# Patient Record
Sex: Female | Born: 1950 | Race: White | Hispanic: No | Marital: Married | Smoking: Former smoker
Health system: Southern US, Community
[De-identification: ages and names within clinical notes are randomized; demographics above are authoritative.]

## PROBLEM LIST (undated history)

## (undated) DIAGNOSIS — J189 Pneumonia, unspecified organism: Secondary | ICD-10-CM

## (undated) DIAGNOSIS — M545 Low back pain, unspecified: Secondary | ICD-10-CM

## (undated) DIAGNOSIS — J4489 Other specified chronic obstructive pulmonary disease: Secondary | ICD-10-CM

## (undated) DIAGNOSIS — R011 Cardiac murmur, unspecified: Secondary | ICD-10-CM

## (undated) DIAGNOSIS — IMO0001 Reserved for inherently not codable concepts without codable children: Secondary | ICD-10-CM

## (undated) DIAGNOSIS — F410 Panic disorder [episodic paroxysmal anxiety] without agoraphobia: Secondary | ICD-10-CM

## (undated) DIAGNOSIS — I10 Essential (primary) hypertension: Secondary | ICD-10-CM

## (undated) DIAGNOSIS — M4800 Spinal stenosis, site unspecified: Secondary | ICD-10-CM

## (undated) DIAGNOSIS — K219 Gastro-esophageal reflux disease without esophagitis: Secondary | ICD-10-CM

## (undated) DIAGNOSIS — I509 Heart failure, unspecified: Secondary | ICD-10-CM

## (undated) DIAGNOSIS — Z9289 Personal history of other medical treatment: Secondary | ICD-10-CM

## (undated) DIAGNOSIS — I4891 Unspecified atrial fibrillation: Secondary | ICD-10-CM

## (undated) DIAGNOSIS — M1990 Unspecified osteoarthritis, unspecified site: Secondary | ICD-10-CM

## (undated) DIAGNOSIS — G8929 Other chronic pain: Secondary | ICD-10-CM

## (undated) DIAGNOSIS — E049 Nontoxic goiter, unspecified: Secondary | ICD-10-CM

## (undated) DIAGNOSIS — I2510 Atherosclerotic heart disease of native coronary artery without angina pectoris: Secondary | ICD-10-CM

## (undated) DIAGNOSIS — G510 Bell's palsy: Secondary | ICD-10-CM

## (undated) DIAGNOSIS — K760 Fatty (change of) liver, not elsewhere classified: Secondary | ICD-10-CM

## (undated) DIAGNOSIS — J449 Chronic obstructive pulmonary disease, unspecified: Secondary | ICD-10-CM

## (undated) DIAGNOSIS — H409 Unspecified glaucoma: Secondary | ICD-10-CM

## (undated) DIAGNOSIS — D649 Anemia, unspecified: Secondary | ICD-10-CM

## (undated) DIAGNOSIS — E785 Hyperlipidemia, unspecified: Secondary | ICD-10-CM

## (undated) DIAGNOSIS — E282 Polycystic ovarian syndrome: Secondary | ICD-10-CM

## (undated) DIAGNOSIS — H269 Unspecified cataract: Secondary | ICD-10-CM

## (undated) HISTORY — DX: Chronic obstructive pulmonary disease, unspecified: J44.9

## (undated) HISTORY — DX: Gastro-esophageal reflux disease without esophagitis: K21.9

## (undated) HISTORY — PX: COLONOSCOPY: SHX174

## (undated) HISTORY — DX: Reserved for inherently not codable concepts without codable children: IMO0001

## (undated) HISTORY — DX: Hyperlipidemia, unspecified: E78.5

## (undated) HISTORY — PX: FACIAL NERVE DECOMPRESSION: SHX185

## (undated) HISTORY — DX: Polycystic ovarian syndrome: E28.2

## (undated) HISTORY — DX: Nontoxic goiter, unspecified: E04.9

## (undated) HISTORY — DX: Unspecified osteoarthritis, unspecified site: M19.90

## (undated) HISTORY — DX: Other chronic pain: G89.29

## (undated) HISTORY — DX: Low back pain, unspecified: M54.50

## (undated) HISTORY — DX: Other specified chronic obstructive pulmonary disease: J44.89

## (undated) HISTORY — DX: Atherosclerotic heart disease of native coronary artery without angina pectoris: I25.10

## (undated) HISTORY — DX: Spinal stenosis, site unspecified: M48.00

## (undated) HISTORY — DX: Essential (primary) hypertension: I10

## (undated) HISTORY — DX: Anemia, unspecified: D64.9

## (undated) HISTORY — DX: Low back pain: M54.5

## (undated) HISTORY — DX: Unspecified glaucoma: H40.9

## (undated) HISTORY — DX: Cardiac murmur, unspecified: R01.1

## (undated) HISTORY — DX: Pneumonia, unspecified organism: J18.9

## (undated) HISTORY — DX: Personal history of other medical treatment: Z92.89

## (undated) HISTORY — DX: Bell's palsy: G51.0

---

## 1980-08-13 HISTORY — PX: ABDOMINAL HYSTERECTOMY: SHX81

## 1998-03-31 ENCOUNTER — Ambulatory Visit (HOSPITAL_COMMUNITY): Admission: RE | Admit: 1998-03-31 | Discharge: 1998-03-31 | Payer: Self-pay | Admitting: Internal Medicine

## 1998-07-15 ENCOUNTER — Other Ambulatory Visit: Admission: RE | Admit: 1998-07-15 | Discharge: 1998-07-15 | Payer: Self-pay | Admitting: Obstetrics and Gynecology

## 1999-09-29 ENCOUNTER — Other Ambulatory Visit: Admission: RE | Admit: 1999-09-29 | Discharge: 1999-09-29 | Payer: Self-pay | Admitting: Obstetrics and Gynecology

## 2000-07-10 ENCOUNTER — Ambulatory Visit (HOSPITAL_COMMUNITY): Admission: RE | Admit: 2000-07-10 | Discharge: 2000-07-10 | Payer: Self-pay | Admitting: Internal Medicine

## 2000-07-10 ENCOUNTER — Encounter: Payer: Self-pay | Admitting: Internal Medicine

## 2000-09-24 ENCOUNTER — Encounter: Payer: Self-pay | Admitting: Obstetrics and Gynecology

## 2000-09-24 ENCOUNTER — Encounter: Admission: RE | Admit: 2000-09-24 | Discharge: 2000-09-24 | Payer: Self-pay | Admitting: Obstetrics and Gynecology

## 2001-01-09 ENCOUNTER — Other Ambulatory Visit: Admission: RE | Admit: 2001-01-09 | Discharge: 2001-01-09 | Payer: Self-pay | Admitting: Obstetrics and Gynecology

## 2001-07-02 ENCOUNTER — Encounter: Admission: RE | Admit: 2001-07-02 | Discharge: 2001-09-30 | Payer: Self-pay | Admitting: Internal Medicine

## 2001-10-01 ENCOUNTER — Ambulatory Visit (HOSPITAL_COMMUNITY): Admission: RE | Admit: 2001-10-01 | Discharge: 2001-10-01 | Payer: Self-pay | Admitting: Internal Medicine

## 2001-10-01 ENCOUNTER — Encounter: Payer: Self-pay | Admitting: Internal Medicine

## 2002-02-04 ENCOUNTER — Other Ambulatory Visit: Admission: RE | Admit: 2002-02-04 | Discharge: 2002-02-04 | Payer: Self-pay | Admitting: Obstetrics and Gynecology

## 2002-05-05 ENCOUNTER — Emergency Department (HOSPITAL_COMMUNITY): Admission: EM | Admit: 2002-05-05 | Discharge: 2002-05-06 | Payer: Self-pay | Admitting: Emergency Medicine

## 2002-08-24 ENCOUNTER — Encounter (HOSPITAL_COMMUNITY): Admission: RE | Admit: 2002-08-24 | Discharge: 2002-11-22 | Payer: Self-pay | Admitting: Cardiology

## 2002-11-23 ENCOUNTER — Encounter (HOSPITAL_COMMUNITY): Admission: RE | Admit: 2002-11-23 | Discharge: 2003-02-21 | Payer: Self-pay | Admitting: Cardiology

## 2002-12-25 ENCOUNTER — Encounter: Payer: Self-pay | Admitting: Obstetrics and Gynecology

## 2002-12-25 ENCOUNTER — Encounter: Admission: RE | Admit: 2002-12-25 | Discharge: 2002-12-25 | Payer: Self-pay | Admitting: Obstetrics and Gynecology

## 2003-02-22 ENCOUNTER — Encounter (HOSPITAL_COMMUNITY): Admission: RE | Admit: 2003-02-22 | Discharge: 2003-03-24 | Payer: Self-pay | Admitting: Cardiology

## 2003-04-27 ENCOUNTER — Encounter (HOSPITAL_COMMUNITY): Admission: RE | Admit: 2003-04-27 | Discharge: 2003-07-26 | Payer: Self-pay | Admitting: Cardiology

## 2004-01-21 ENCOUNTER — Encounter: Admission: RE | Admit: 2004-01-21 | Discharge: 2004-01-21 | Payer: Self-pay | Admitting: Obstetrics and Gynecology

## 2004-06-30 ENCOUNTER — Ambulatory Visit: Payer: Self-pay | Admitting: Internal Medicine

## 2004-10-13 ENCOUNTER — Ambulatory Visit: Payer: Self-pay | Admitting: Internal Medicine

## 2004-12-09 ENCOUNTER — Ambulatory Visit (HOSPITAL_COMMUNITY): Admission: RE | Admit: 2004-12-09 | Discharge: 2004-12-09 | Payer: Self-pay | Admitting: Emergency Medicine

## 2005-05-11 ENCOUNTER — Ambulatory Visit: Payer: Self-pay | Admitting: Internal Medicine

## 2005-09-21 ENCOUNTER — Ambulatory Visit: Payer: Self-pay | Admitting: Internal Medicine

## 2005-09-21 ENCOUNTER — Encounter: Admission: RE | Admit: 2005-09-21 | Discharge: 2005-09-21 | Payer: Self-pay | Admitting: Obstetrics and Gynecology

## 2005-10-03 ENCOUNTER — Ambulatory Visit: Payer: Self-pay | Admitting: Internal Medicine

## 2005-11-15 ENCOUNTER — Encounter: Admission: RE | Admit: 2005-11-15 | Discharge: 2005-11-15 | Payer: Self-pay | Admitting: Internal Medicine

## 2006-02-15 ENCOUNTER — Encounter: Admission: RE | Admit: 2006-02-15 | Discharge: 2006-05-16 | Payer: Self-pay | Admitting: Internal Medicine

## 2006-04-02 ENCOUNTER — Ambulatory Visit: Payer: Self-pay | Admitting: Internal Medicine

## 2006-08-13 DIAGNOSIS — J189 Pneumonia, unspecified organism: Secondary | ICD-10-CM

## 2006-08-13 HISTORY — DX: Pneumonia, unspecified organism: J18.9

## 2006-10-10 ENCOUNTER — Ambulatory Visit: Payer: Self-pay | Admitting: Internal Medicine

## 2006-11-01 ENCOUNTER — Encounter: Admission: RE | Admit: 2006-11-01 | Discharge: 2006-11-01 | Payer: Self-pay | Admitting: Internal Medicine

## 2007-01-01 ENCOUNTER — Ambulatory Visit: Payer: Self-pay | Admitting: Internal Medicine

## 2007-01-01 LAB — CONVERTED CEMR LAB
ALT: 18 units/L (ref 0–40)
AST: 18 units/L (ref 0–37)
Cholesterol: 153 mg/dL (ref 0–200)
Creatinine,U: 38.3 mg/dL
Direct LDL: 74.7 mg/dL
HDL: 44.7 mg/dL (ref >39.0)
Hgb A1c MFr Bld: 5.8 % (ref 4.6–6.0)
Microalb Creat Ratio: 96.6 mg/g — ABNORMAL HIGH (ref 0.0–30.0)
Microalb, Ur: 3.7 mg/dL — ABNORMAL HIGH (ref 0.0–1.9)
Total CHOL/HDL Ratio: 3.4
Triglycerides: 204 mg/dL (ref 0–149)
VLDL: 41 mg/dL — ABNORMAL HIGH (ref 0–40)

## 2007-01-02 DIAGNOSIS — E785 Hyperlipidemia, unspecified: Secondary | ICD-10-CM | POA: Insufficient documentation

## 2007-01-02 DIAGNOSIS — R7402 Elevation of levels of lactic acid dehydrogenase (LDH): Secondary | ICD-10-CM | POA: Insufficient documentation

## 2007-01-02 DIAGNOSIS — Z8701 Personal history of pneumonia (recurrent): Secondary | ICD-10-CM | POA: Insufficient documentation

## 2007-01-02 DIAGNOSIS — R7401 Elevation of levels of liver transaminase levels: Secondary | ICD-10-CM | POA: Insufficient documentation

## 2007-01-02 DIAGNOSIS — Z8719 Personal history of other diseases of the digestive system: Secondary | ICD-10-CM | POA: Insufficient documentation

## 2007-01-02 DIAGNOSIS — E1159 Type 2 diabetes mellitus with other circulatory complications: Secondary | ICD-10-CM | POA: Insufficient documentation

## 2007-01-02 DIAGNOSIS — E1169 Type 2 diabetes mellitus with other specified complication: Secondary | ICD-10-CM | POA: Insufficient documentation

## 2007-01-02 DIAGNOSIS — E049 Nontoxic goiter, unspecified: Secondary | ICD-10-CM | POA: Insufficient documentation

## 2007-01-02 DIAGNOSIS — R740 Nonspecific elevation of levels of transaminase and lactic acid dehydrogenase [LDH]: Secondary | ICD-10-CM

## 2007-01-02 DIAGNOSIS — Z9079 Acquired absence of other genital organ(s): Secondary | ICD-10-CM | POA: Insufficient documentation

## 2007-04-11 ENCOUNTER — Encounter: Payer: Self-pay | Admitting: Internal Medicine

## 2007-04-18 ENCOUNTER — Encounter: Payer: Self-pay | Admitting: Internal Medicine

## 2007-06-27 ENCOUNTER — Ambulatory Visit: Payer: Self-pay | Admitting: Internal Medicine

## 2007-08-12 ENCOUNTER — Telehealth (INDEPENDENT_AMBULATORY_CARE_PROVIDER_SITE_OTHER): Payer: Self-pay | Admitting: *Deleted

## 2007-08-14 HISTORY — PX: UPPER GI ENDOSCOPY: SHX6162

## 2007-08-15 ENCOUNTER — Ambulatory Visit: Payer: Self-pay | Admitting: Internal Medicine

## 2007-08-15 DIAGNOSIS — D509 Iron deficiency anemia, unspecified: Secondary | ICD-10-CM

## 2007-08-15 DIAGNOSIS — D649 Anemia, unspecified: Secondary | ICD-10-CM | POA: Insufficient documentation

## 2007-09-04 ENCOUNTER — Encounter: Payer: Self-pay | Admitting: Internal Medicine

## 2007-09-26 ENCOUNTER — Encounter: Payer: Self-pay | Admitting: Internal Medicine

## 2007-10-27 ENCOUNTER — Telehealth (INDEPENDENT_AMBULATORY_CARE_PROVIDER_SITE_OTHER): Payer: Self-pay | Admitting: *Deleted

## 2007-10-29 ENCOUNTER — Telehealth (INDEPENDENT_AMBULATORY_CARE_PROVIDER_SITE_OTHER): Payer: Self-pay | Admitting: *Deleted

## 2007-11-05 ENCOUNTER — Telehealth (INDEPENDENT_AMBULATORY_CARE_PROVIDER_SITE_OTHER): Payer: Self-pay | Admitting: *Deleted

## 2007-11-14 ENCOUNTER — Encounter: Admission: RE | Admit: 2007-11-14 | Discharge: 2007-11-14 | Payer: Self-pay | Admitting: Obstetrics and Gynecology

## 2008-01-20 ENCOUNTER — Telehealth (INDEPENDENT_AMBULATORY_CARE_PROVIDER_SITE_OTHER): Payer: Self-pay | Admitting: *Deleted

## 2008-01-23 ENCOUNTER — Ambulatory Visit: Payer: Self-pay | Admitting: Internal Medicine

## 2008-01-26 LAB — CONVERTED CEMR LAB
ALT: 21 units/L (ref 0–35)
AST: 19 units/L (ref 0–37)
Cholesterol: 217 mg/dL (ref 0–200)
Creatinine,U: 28.3 mg/dL
Direct LDL: 143.4 mg/dL
HDL: 41.5 mg/dL (ref >39.0)
Hgb A1c MFr Bld: 5.7 % (ref 4.6–6.0)
Microalb Creat Ratio: 127.2 mg/g — ABNORMAL HIGH (ref 0.0–30.0)
Microalb, Ur: 3.6 mg/dL — ABNORMAL HIGH (ref 0.0–1.9)
Total CHOL/HDL Ratio: 5.2
Triglycerides: 198 mg/dL — ABNORMAL HIGH (ref 0–149)
VLDL: 40 mg/dL (ref 0–40)

## 2008-02-09 ENCOUNTER — Encounter: Payer: Self-pay | Admitting: Internal Medicine

## 2008-02-12 ENCOUNTER — Ambulatory Visit: Payer: Self-pay | Admitting: Internal Medicine

## 2008-02-12 DIAGNOSIS — M545 Low back pain, unspecified: Secondary | ICD-10-CM | POA: Insufficient documentation

## 2008-02-12 LAB — CONVERTED CEMR LAB
Cholesterol, target level: 200 mg/dL
HDL goal, serum: 40 mg/dL
LDL Goal: 100 mg/dL

## 2008-02-26 ENCOUNTER — Encounter: Payer: Self-pay | Admitting: Internal Medicine

## 2008-02-26 HISTORY — PX: TRANSTHORACIC ECHOCARDIOGRAM: SHX275

## 2008-03-04 ENCOUNTER — Telehealth (INDEPENDENT_AMBULATORY_CARE_PROVIDER_SITE_OTHER): Payer: Self-pay | Admitting: *Deleted

## 2008-03-08 ENCOUNTER — Telehealth (INDEPENDENT_AMBULATORY_CARE_PROVIDER_SITE_OTHER): Payer: Self-pay | Admitting: *Deleted

## 2008-03-29 ENCOUNTER — Telehealth (INDEPENDENT_AMBULATORY_CARE_PROVIDER_SITE_OTHER): Payer: Self-pay | Admitting: *Deleted

## 2008-04-02 ENCOUNTER — Encounter: Payer: Self-pay | Admitting: Internal Medicine

## 2008-04-16 ENCOUNTER — Telehealth (INDEPENDENT_AMBULATORY_CARE_PROVIDER_SITE_OTHER): Payer: Self-pay | Admitting: *Deleted

## 2008-04-26 ENCOUNTER — Telehealth (INDEPENDENT_AMBULATORY_CARE_PROVIDER_SITE_OTHER): Payer: Self-pay | Admitting: *Deleted

## 2008-05-06 ENCOUNTER — Encounter: Payer: Self-pay | Admitting: Internal Medicine

## 2008-05-21 ENCOUNTER — Encounter: Admission: RE | Admit: 2008-05-21 | Discharge: 2008-05-21 | Payer: Self-pay | Admitting: Cardiology

## 2008-05-26 ENCOUNTER — Ambulatory Visit (HOSPITAL_COMMUNITY): Admission: RE | Admit: 2008-05-26 | Discharge: 2008-05-27 | Payer: Self-pay | Admitting: Cardiology

## 2008-05-26 HISTORY — PX: CORONARY ANGIOPLASTY: SHX604

## 2008-06-17 ENCOUNTER — Encounter: Payer: Self-pay | Admitting: Internal Medicine

## 2008-06-22 ENCOUNTER — Telehealth (INDEPENDENT_AMBULATORY_CARE_PROVIDER_SITE_OTHER): Payer: Self-pay | Admitting: *Deleted

## 2008-07-22 ENCOUNTER — Ambulatory Visit (HOSPITAL_COMMUNITY): Admission: RE | Admit: 2008-07-22 | Discharge: 2008-07-22 | Payer: Self-pay | Admitting: Gastroenterology

## 2008-07-22 ENCOUNTER — Encounter: Payer: Self-pay | Admitting: Internal Medicine

## 2008-07-22 ENCOUNTER — Encounter (INDEPENDENT_AMBULATORY_CARE_PROVIDER_SITE_OTHER): Payer: Self-pay | Admitting: Gastroenterology

## 2008-08-31 ENCOUNTER — Telehealth (INDEPENDENT_AMBULATORY_CARE_PROVIDER_SITE_OTHER): Payer: Self-pay | Admitting: *Deleted

## 2008-11-19 ENCOUNTER — Encounter: Admission: RE | Admit: 2008-11-19 | Discharge: 2008-11-19 | Payer: Self-pay | Admitting: Obstetrics and Gynecology

## 2008-11-22 ENCOUNTER — Telehealth (INDEPENDENT_AMBULATORY_CARE_PROVIDER_SITE_OTHER): Payer: Self-pay | Admitting: *Deleted

## 2008-12-10 ENCOUNTER — Encounter: Payer: Self-pay | Admitting: Internal Medicine

## 2008-12-22 ENCOUNTER — Telehealth (INDEPENDENT_AMBULATORY_CARE_PROVIDER_SITE_OTHER): Payer: Self-pay | Admitting: *Deleted

## 2009-01-12 ENCOUNTER — Encounter (HOSPITAL_COMMUNITY): Admission: RE | Admit: 2009-01-12 | Discharge: 2009-04-12 | Payer: Self-pay | Admitting: Cardiology

## 2009-01-17 ENCOUNTER — Ambulatory Visit: Payer: Self-pay | Admitting: Internal Medicine

## 2009-01-17 DIAGNOSIS — J45909 Unspecified asthma, uncomplicated: Secondary | ICD-10-CM | POA: Insufficient documentation

## 2009-01-18 ENCOUNTER — Telehealth (INDEPENDENT_AMBULATORY_CARE_PROVIDER_SITE_OTHER): Payer: Self-pay | Admitting: *Deleted

## 2009-02-23 ENCOUNTER — Telehealth (INDEPENDENT_AMBULATORY_CARE_PROVIDER_SITE_OTHER): Payer: Self-pay | Admitting: *Deleted

## 2009-05-06 ENCOUNTER — Ambulatory Visit: Payer: Self-pay | Admitting: Internal Medicine

## 2009-05-06 DIAGNOSIS — I10 Essential (primary) hypertension: Secondary | ICD-10-CM | POA: Insufficient documentation

## 2009-05-10 ENCOUNTER — Telehealth (INDEPENDENT_AMBULATORY_CARE_PROVIDER_SITE_OTHER): Payer: Self-pay | Admitting: *Deleted

## 2009-05-10 LAB — CONVERTED CEMR LAB
ALT: 20 units/L (ref 0–35)
AST: 20 units/L (ref 0–37)
Albumin: 3.8 g/dL (ref 3.5–5.2)
Alkaline Phosphatase: 63 units/L (ref 39–117)
BUN: 10 mg/dL (ref 6–23)
Basophils Absolute: 0.1 10*3/uL (ref 0.0–0.1)
Basophils Relative: 0.8 % (ref 0.0–3.0)
Bilirubin, Direct: 0 mg/dL (ref 0.0–0.3)
CO2: 30 meq/L (ref 19–32)
Calcium: 8.8 mg/dL (ref 8.4–10.5)
Chloride: 104 meq/L (ref 96–112)
Cholesterol: 121 mg/dL (ref 0–200)
Creatinine, Ser: 0.7 mg/dL (ref 0.4–1.2)
Eosinophils Absolute: 0.2 10*3/uL (ref 0.0–0.7)
Eosinophils Relative: 2.8 % (ref 0.0–5.0)
Free T4: 0.8 ng/dL (ref 0.6–1.6)
GFR calc non Af Amer: 90.56 mL/min (ref >60)
Glucose, Bld: 121 mg/dL — ABNORMAL HIGH (ref 70–99)
HCT: 35.2 % — ABNORMAL LOW (ref 36.0–46.0)
HDL: 41.5 mg/dL (ref >39.00)
Hemoglobin: 11.7 g/dL — ABNORMAL LOW (ref 12.0–15.0)
Hgb A1c MFr Bld: 5.7 % (ref 4.6–6.5)
LDL Cholesterol: 62 mg/dL (ref 0–99)
Lymphocytes Relative: 19.9 % (ref 12.0–46.0)
Lymphs Abs: 1.5 10*3/uL (ref 0.7–4.0)
MCHC: 33.2 g/dL (ref 30.0–36.0)
MCV: 90 fL (ref 78.0–100.0)
Monocytes Absolute: 0.5 10*3/uL (ref 0.1–1.0)
Monocytes Relative: 7.2 % (ref 3.0–12.0)
Neutro Abs: 5.2 10*3/uL (ref 1.4–7.7)
Neutrophils Relative %: 69.3 % (ref 43.0–77.0)
Platelets: 283 10*3/uL (ref 150.0–400.0)
Potassium: 4.2 meq/L (ref 3.5–5.1)
RBC: 3.91 M/uL (ref 3.87–5.11)
RDW: 13 % (ref 11.5–14.6)
Sodium: 139 meq/L (ref 135–145)
T3, Free: 2.2 pg/mL — ABNORMAL LOW (ref 2.3–4.2)
TSH: 1.44 microintl units/mL (ref 0.35–5.50)
Total Bilirubin: 0.5 mg/dL (ref 0.3–1.2)
Total CHOL/HDL Ratio: 3
Total Protein: 7.2 g/dL (ref 6.0–8.3)
Triglycerides: 88 mg/dL (ref 0.0–149.0)
VLDL: 17.6 mg/dL (ref 0.0–40.0)
WBC: 7.5 10*3/uL (ref 4.5–10.5)

## 2009-05-18 ENCOUNTER — Ambulatory Visit: Payer: Self-pay | Admitting: Internal Medicine

## 2009-05-20 LAB — CONVERTED CEMR LAB
Basophils Absolute: 0 10*3/uL (ref 0.0–0.1)
Basophils Relative: 0.1 % (ref 0.0–3.0)
Eosinophils Absolute: 0.2 10*3/uL (ref 0.0–0.7)
Eosinophils Relative: 2.4 % (ref 0.0–5.0)
Folate: 15.5 ng/mL
HCT: 34.5 % — ABNORMAL LOW (ref 36.0–46.0)
Hemoglobin: 11.5 g/dL — ABNORMAL LOW (ref 12.0–15.0)
Iron: 61 ug/dL (ref 42–145)
Lymphocytes Relative: 15.9 % (ref 12.0–46.0)
Lymphs Abs: 1.3 10*3/uL (ref 0.7–4.0)
MCHC: 33.2 g/dL (ref 30.0–36.0)
MCV: 91 fL (ref 78.0–100.0)
Monocytes Absolute: 0.4 10*3/uL (ref 0.1–1.0)
Monocytes Relative: 4.3 % (ref 3.0–12.0)
Neutro Abs: 6.3 10*3/uL (ref 1.4–7.7)
Neutrophils Relative %: 77.3 % — ABNORMAL HIGH (ref 43.0–77.0)
Platelets: 247 10*3/uL (ref 150.0–400.0)
RBC: 3.8 M/uL — ABNORMAL LOW (ref 3.87–5.11)
RDW: 12.9 % (ref 11.5–14.6)
Saturation Ratios: 12.5 % — ABNORMAL LOW (ref 20.0–50.0)
Transferrin: 348.6 mg/dL (ref 212.0–360.0)
Vitamin B-12: 359 pg/mL (ref 211–911)
WBC: 8.2 10*3/uL (ref 4.5–10.5)

## 2009-05-25 ENCOUNTER — Ambulatory Visit: Payer: Self-pay | Admitting: Internal Medicine

## 2009-05-25 LAB — CONVERTED CEMR LAB
OCCULT 1: NEGATIVE
OCCULT 2: NEGATIVE
OCCULT 3: NEGATIVE

## 2009-06-24 ENCOUNTER — Encounter: Payer: Self-pay | Admitting: Internal Medicine

## 2009-07-29 ENCOUNTER — Encounter: Payer: Self-pay | Admitting: Internal Medicine

## 2009-08-17 ENCOUNTER — Telehealth (INDEPENDENT_AMBULATORY_CARE_PROVIDER_SITE_OTHER): Payer: Self-pay | Admitting: *Deleted

## 2009-08-17 ENCOUNTER — Encounter: Payer: Self-pay | Admitting: Internal Medicine

## 2009-08-19 ENCOUNTER — Ambulatory Visit: Payer: Self-pay | Admitting: Internal Medicine

## 2009-08-19 ENCOUNTER — Telehealth: Payer: Self-pay | Admitting: Internal Medicine

## 2009-08-19 DIAGNOSIS — J45901 Unspecified asthma with (acute) exacerbation: Secondary | ICD-10-CM | POA: Insufficient documentation

## 2009-08-30 ENCOUNTER — Telehealth (INDEPENDENT_AMBULATORY_CARE_PROVIDER_SITE_OTHER): Payer: Self-pay | Admitting: *Deleted

## 2010-01-02 ENCOUNTER — Telehealth (INDEPENDENT_AMBULATORY_CARE_PROVIDER_SITE_OTHER): Payer: Self-pay | Admitting: *Deleted

## 2010-01-23 ENCOUNTER — Telehealth (INDEPENDENT_AMBULATORY_CARE_PROVIDER_SITE_OTHER): Payer: Self-pay | Admitting: *Deleted

## 2010-02-24 ENCOUNTER — Encounter: Admission: RE | Admit: 2010-02-24 | Discharge: 2010-02-24 | Payer: Self-pay | Admitting: Obstetrics and Gynecology

## 2010-03-20 ENCOUNTER — Telehealth (INDEPENDENT_AMBULATORY_CARE_PROVIDER_SITE_OTHER): Payer: Self-pay | Admitting: *Deleted

## 2010-04-07 ENCOUNTER — Telehealth (INDEPENDENT_AMBULATORY_CARE_PROVIDER_SITE_OTHER): Payer: Self-pay | Admitting: *Deleted

## 2010-05-01 ENCOUNTER — Telehealth (INDEPENDENT_AMBULATORY_CARE_PROVIDER_SITE_OTHER): Payer: Self-pay | Admitting: *Deleted

## 2010-05-12 ENCOUNTER — Ambulatory Visit: Payer: Self-pay | Admitting: Internal Medicine

## 2010-05-12 DIAGNOSIS — R1319 Other dysphagia: Secondary | ICD-10-CM | POA: Insufficient documentation

## 2010-05-12 DIAGNOSIS — M79609 Pain in unspecified limb: Secondary | ICD-10-CM | POA: Insufficient documentation

## 2010-05-15 ENCOUNTER — Encounter: Payer: Self-pay | Admitting: Internal Medicine

## 2010-05-18 ENCOUNTER — Ambulatory Visit: Payer: Self-pay | Admitting: Internal Medicine

## 2010-05-18 ENCOUNTER — Encounter (INDEPENDENT_AMBULATORY_CARE_PROVIDER_SITE_OTHER): Payer: Self-pay | Admitting: *Deleted

## 2010-05-18 ENCOUNTER — Telehealth (INDEPENDENT_AMBULATORY_CARE_PROVIDER_SITE_OTHER): Payer: Self-pay | Admitting: *Deleted

## 2010-05-18 LAB — CONVERTED CEMR LAB
Bilirubin Urine: NEGATIVE
Blood in Urine, dipstick: NEGATIVE
Glucose, Urine, Semiquant: NEGATIVE
Ketones, urine, test strip: NEGATIVE
Nitrite: NEGATIVE
Protein, U semiquant: NEGATIVE
Specific Gravity, Urine: <1.005
Urobilinogen, UA: NEGATIVE
WBC Urine, dipstick: NEGATIVE
pH: 5

## 2010-05-19 LAB — CONVERTED CEMR LAB
ALT: 20 units/L (ref 0–35)
AST: 20 units/L (ref 0–37)
Albumin: 3.7 g/dL (ref 3.5–5.2)
Alkaline Phosphatase: 58 units/L (ref 39–117)
BUN: 11 mg/dL (ref 6–23)
Basophils Absolute: 0 10*3/uL (ref 0.0–0.1)
Basophils Relative: 0.4 % (ref 0.0–3.0)
Bilirubin, Direct: 0.1 mg/dL (ref 0.0–0.3)
CO2: 27 meq/L (ref 19–32)
Calcium: 8.8 mg/dL (ref 8.4–10.5)
Chloride: 98 meq/L (ref 96–112)
Cholesterol: 123 mg/dL (ref 0–200)
Creatinine, Ser: 0.7 mg/dL (ref 0.4–1.2)
Eosinophils Absolute: 0.2 10*3/uL (ref 0.0–0.7)
Eosinophils Relative: 3.2 % (ref 0.0–5.0)
GFR calc non Af Amer: 91.76 mL/min (ref >60)
Glucose, Bld: 105 mg/dL — ABNORMAL HIGH (ref 70–99)
HCT: 34.7 % — ABNORMAL LOW (ref 36.0–46.0)
HDL: 49.3 mg/dL (ref >39.00)
Hemoglobin: 11.7 g/dL — ABNORMAL LOW (ref 12.0–15.0)
Hgb A1c MFr Bld: 6.2 % (ref 4.6–6.5)
LDL Cholesterol: 58 mg/dL (ref 0–99)
Lymphocytes Relative: 16.5 % (ref 12.0–46.0)
Lymphs Abs: 1.3 10*3/uL (ref 0.7–4.0)
MCHC: 33.7 g/dL (ref 30.0–36.0)
MCV: 90.2 fL (ref 78.0–100.0)
Monocytes Absolute: 0.5 10*3/uL (ref 0.1–1.0)
Monocytes Relative: 6.2 % (ref 3.0–12.0)
Neutro Abs: 5.6 10*3/uL (ref 1.4–7.7)
Neutrophils Relative %: 73.7 % (ref 43.0–77.0)
Platelets: 253 10*3/uL (ref 150.0–400.0)
Potassium: 4.2 meq/L (ref 3.5–5.1)
RBC: 3.85 M/uL — ABNORMAL LOW (ref 3.87–5.11)
RDW: 14 % (ref 11.5–14.6)
Sodium: 136 meq/L (ref 135–145)
TSH: 2.01 microintl units/mL (ref 0.35–5.50)
Total Bilirubin: 0.3 mg/dL (ref 0.3–1.2)
Total CHOL/HDL Ratio: 2
Total Protein: 6.5 g/dL (ref 6.0–8.3)
Triglycerides: 79 mg/dL (ref 0.0–149.0)
VLDL: 15.8 mg/dL (ref 0.0–40.0)
WBC: 7.6 10*3/uL (ref 4.5–10.5)

## 2010-07-03 ENCOUNTER — Telehealth: Payer: Self-pay | Admitting: Internal Medicine

## 2010-07-05 ENCOUNTER — Ambulatory Visit: Payer: Self-pay | Admitting: Internal Medicine

## 2010-08-08 ENCOUNTER — Telehealth: Payer: Self-pay | Admitting: Internal Medicine

## 2010-08-25 ENCOUNTER — Encounter: Payer: Self-pay | Admitting: Internal Medicine

## 2010-09-02 ENCOUNTER — Encounter: Payer: Self-pay | Admitting: Internal Medicine

## 2010-09-03 ENCOUNTER — Encounter: Payer: Self-pay | Admitting: Obstetrics and Gynecology

## 2010-09-03 ENCOUNTER — Encounter: Payer: Self-pay | Admitting: Orthopedic Surgery

## 2010-09-04 ENCOUNTER — Encounter: Payer: Self-pay | Admitting: Orthopedic Surgery

## 2010-09-04 ENCOUNTER — Telehealth (INDEPENDENT_AMBULATORY_CARE_PROVIDER_SITE_OTHER): Payer: Self-pay | Admitting: *Deleted

## 2010-09-05 ENCOUNTER — Ambulatory Visit
Admission: RE | Admit: 2010-09-05 | Discharge: 2010-09-05 | Payer: Self-pay | Source: Home / Self Care | Attending: Internal Medicine | Admitting: Internal Medicine

## 2010-09-05 DIAGNOSIS — R519 Headache, unspecified: Secondary | ICD-10-CM | POA: Insufficient documentation

## 2010-09-05 DIAGNOSIS — R51 Headache: Secondary | ICD-10-CM | POA: Insufficient documentation

## 2010-09-05 DIAGNOSIS — R42 Dizziness and giddiness: Secondary | ICD-10-CM | POA: Insufficient documentation

## 2010-09-14 NOTE — Assessment & Plan Note (Signed)
Summary: ROA//TO F/U ON LABS// CYD   Vital Signs:  Patient Profile:   60 Years Old Female Weight:      213.4 pounds Pulse rate:   76 / minute Resp:     15 per minute BP sitting:   124 / 82  (left arm) Cuff size:   large  Vitals Entered By: Shonna Chock (February 12, 2008 4:06 PM)                 Chief Complaint:  FOLLOW-UP ON LABS and Type 2 diabetes mellitus follow-up.  History of Present Illness: Labs reviewed ; A1c was 5.7%. No hypoglycemia as she adjusts Actos & Metformin if meal intake decreased. Dr Elsie Lincoln changed her from Lipitor to Zetia; LDL now 143.  Type 2 Diabetes Mellitus Follow-Up      This is a 60 year old woman who presents for Type 2 diabetes mellitus follow-up.  Minor postural symptoms.  The patient reports numbness of extremities, but denies polyuria, polydipsia, blurred vision, self managed hypoglycemia, hypoglycemia requiring help, weight loss, and weight gain.  Other symptoms include orthostatic symptoms.  The patient denies the following symptoms: neuropathic pain, chest pain, vomiting, poor wound healing, intermittent claudication, vision loss, and foot ulcer.  Since the last visit the patient reports good dietary compliance, compliance with medications, not exercising regularly, and not monitoring blood glucose.  Since the last visit, the patient reports having had eye care by an ophthalmologist and no foot care.  Complications from diabetes include ASCVD.    Lipid Management History:      Positive NCEP/ATP III risk factors include female age 62 years old or older or older, diabetes, family history for ischemic heart disease (males less than 46 years old), hypertension, and ASHD (atherosclerotic heart disease).  Negative NCEP/ATP III risk factors include no history of early menopause without estrogen hormone replacement, non-tobacco-user status, no prior stroke/TIA, no peripheral vascular disease, and no history of aortic aneurysm.       Current Allergies: ! *  PLAVIX  Past Medical History:    Diabetes mellitus, type II    Hyperlipidemia    PSO; S/P TAH & BSO  Past Surgical History:    Hysterectomy & BSO    CATH-2002,2006   Family History:    Father: DECEASED    Mother: DECEASED    Siblings: 2 BROTHERS, 1 SISTER    HEART-FATHER DECEASED (AGE 60) HEART ATTACK             Family History Hypertension-FATHER        Family History of Alcoholism/Addiction-AUNTS/UNCLES ON BOTH SIDE'S OF FAMILY    Family History Depression-MOTHER        Family History of Ulcers-MOTHER    LUNG COMPLICATIONS-MOTHER     Family Hsitory Headaches-MOTHER  Social History:    No diet   Risk Factors:  Tobacco use:  quit    Year quit:  AGE42 Alcohol use:  no Exercise:  no  Family History Risk Factors:    Family History of MI in females < 69 years old:  no    Family History of MI in males < 49 years old:  yes   Review of Systems  General      Denies fatigue, sleep disorder, and weight loss.  Eyes      Denies blurring and double vision.      Bilat cataracts  ENT      Denies difficulty swallowing and hoarseness.  CV      Denies chest pain  or discomfort, difficulty breathing at night, difficulty breathing while lying down, palpitations, swelling of feet, and swelling of hands.      DOE but not exercising  Resp      Denies excessive snoring, hypersomnolence, and morning headaches.  GI      Complains of diarrhea and indigestion.      Denies abdominal pain, bloody stools, constipation, and dark tarry stools.      Loose stool with dairy; colonoscopy 03/17/08 ,Dr Matthias Hughs. No dysphagia  GU      Denies incontinence.  MS      Complains of joint pain and low back pain.      Celebrex once daily  & 3 AS Tylenol  daily  Derm      Denies changes in color of skin and poor wound healing.  Neuro      Complains of numbness and tingling.      intermittent tingling in hands  Endo      Denies cold intolerance, excessive hunger, excessive thirst,  excessive urination, heat intolerance, polyuria, and weight change.   Physical Exam  General:     Well-developed,well-nourished,in no acute distress; alert,appropriate and cooperative throughout examination Neck:     No deformities, masses, or tenderness noted.Thyroid slightly full on R Lungs:     Normal respiratory effort, chest expands symmetrically. Lungs are clear to auscultation, no crackles or wheezes. Heart:     normal rate, regular rhythm, no gallop, no rub, no JVD, and grade 1 /6 systolic murmur.   Abdomen:     Bowel sounds positive,abdomen soft and non-tender without masses, organomegaly or hernias noted. Pulses:     R and L carotid,radial,dorsalis pedis and posterior tibial pulses are full and equal bilaterally. Bilat carotid bruits vs radiation Extremities:     No clubbing, cyanosis, edema, or deformity noted . Neurologic:     alert & oriented X3 and DTRs symmetrical and normal.  Equivocal Tinel's RUE Skin:     Intact without suspicious lesions or rashes Cervical Nodes:     No lymphadenopathy noted Axillary Nodes:     No palpable lymphadenopathy Psych:     memory intact for recent and remote, normally interactive, good eye contact, and not anxious appearing.      Impression & Recommendations:  Problem # 1:  HYPERLIPIDEMIA (ICD-272.4) as per Dr Elsie Lincoln Her updated medication list for this problem includes:    Zetia 10 Mg Tabs (Ezetimibe) .[name]... 1 by mouth qd   Problem # 2:  DIABETES MELLITUS, TYPE II (ICD-250.00)  Her updated medication list for this problem includes:    Metformin Hcl 1000 Mg Tabs (Metformin hcl) .[name]... Take 1 tablet by mouth twice a day    Accupril 40 Mg Tabs (Quinapril hcl) .[name]... 1 by mouth qd    Actos 45 Mg Tabs (Pioglitazone hcl) .[name]... 1 by mouth qd    Aspirin Adult Low Strength 81 Mg Tbec (Aspirin) .[name]... 1 by mouth once daily   Problem # 3:  GOITER (ICD-240.9)  Problem # 4:  LOW BACK PAIN, CHRONIC (ICD-724.2) Dr Jeral Fruit , MRI  pending Her updated medication list for this problem includes:    Celebrex 200 Mg Caps (Celecoxib) .[name]... 1 by mouth bid    Aspirin Adult Low Strength 81 Mg Tbec (Aspirin) .[name]... 1 by mouth once daily    Darvon-n 100 Mg Tabs (Propoxyphene napsylate) .[name]... 1-2 by mouth once daily as needed    Tylenol Arthritis Pain 650 Mg Tbcr (Acetaminophen) .[name]... 1 by mouth in  the am, 2 by mouth in the pm   Complete Medication List: 1)  Metformin Hcl 1000 Mg Tabs (Metformin hcl) .... Take 1 tablet by mouth twice a day 2)  Accupril 40 Mg Tabs (Quinapril hcl) .[name].. 1 by mouth qd 3)  Advair Diskus 250-50 Mcg/dose Misc (Fluticasone-salmeterol) .... Use one puff twice daily, rinse, gargle and spit after use. 4)  Atenolol 50 Mg Tabs (Atenolol) .[name].. 1 by mouth qd 5)  Paxil Cr 25 Mg Tb24 (Paroxetine hcl) .[name].. 1 by mouth qd 6)  Estradiol 0.5 Mg Tabs (Estradiol) .[name].. 1 by mouth qd 7)  Amaryl 2 Mg Tabs (glimepiride)  .... 1/2 tab qd 8)  Timoptic-xe 0.25 % Solg (Timolol maleate) 9)  Celebrex 200 Mg Caps (Celecoxib) .[name].. 1 by mouth bid 10)  Actos 45 Mg Tabs (Pioglitazone hcl) .[name].. 1 by mouth qd 11)  Azmacort 75 Mcg/act Aers (Triamcinolone acetonide) .... 2 puffs 2-4 times daily 12)  Albuterol 90 Mcg/act Aers (Albuterol) .... Prn 13)  Lorazepam 0.5 Mg Tabs (Lorazepam) .... 1/2 tab once daily prn 14)  Amlodipine Besylate 10 Mg Tabs (Amlodipine besylate) .[name].. 1 by mouth qd 15)  Zetia 10 Mg Tabs (Ezetimibe) .[name].. 1 by mouth qd 16)  Bactroban 2 % Oint (Mupirocin) .... Use as directed daily 17)  Multivitamins Tabs (Multiple vitamin) .[name].. 1 by mouth once daily 18)  Aspirin Adult Low Strength 81 Mg Tbec (Aspirin) .[name].. 1 by mouth once daily 19)  Vitamin C 1000 Mg Tabs (Ascorbic acid) .[name].. 1 by mouth once daily 20)  Darvon-n 100 Mg Tabs (Propoxyphene napsylate) .[name].. 1-2 by mouth once daily as needed 21)  Claritin 10 Mg Tabs (Loratadine) .[name].. 1 by mouth once daily 22)  Tylenol Arthritis Pain 650 Mg Tbcr (Acetaminophen) .[name].. 1 by  mouth in the am, 2 by mouth in the pm 23)  Melatonin 5 Mg Tabs (Melatonin) .[name].. 1 by mouth at bedtime 24)  Fish Oil 1000 Mg Caps (Omega-3 fatty acids) .[name].. 1 by mouth once daily 25)  Mucinex 600 Mg Tb12 (Guaifenesin) .[name].. 1 by mouth as needed 26)  Herb Lax  .Marland KitchenMarland Kitchen. 1 by mouth once daily 27)  Spiru-tein in 1% Milk  .[name].. 7 days/week  Lipid Assessment/Plan:      Based on NCEP/ATP III, the patient's risk factor category is "history of coronary disease, peripheral vascular disease, cerebrovascular disease, or aortic aneurysm along with either diabetes, current smoker, or LDL > 130 plus HDL < 40 plus triglycerides > 200".  From this information, the patient's calculated lipid goals are as follows: Total cholesterol goal is 200; LDL cholesterol goal is 70; HDL cholesterol goal is 40; Triglyceride goal is 150.  Her LDL cholesterol goal has not been met.  Secondary causes for hyperlipidemia have been ruled out.  She has been counseled on adjunctive measures for lowering her cholesterol and has been provided with dietary instructions.     Patient Instructions: 1)  Share these records & labs with each physician you see. LDL goal = < 70, unless Dr Elsie Lincoln declares otherwise   Prescriptions: AMARYL 2 MG  TABS (GLIMEPIRIDE) 1/2 tab qd  #90 x 1   Entered and Authorized by:   Marga Melnick MD   Signed by:   Marga Melnick MD on 02/12/2008   Method used:   Print then Give to Patient   RxID:   [id number]  ]  Appended Document: ROA//TO F/U ON LABS// CYD Prescriptions: FREESTYLE LANCETS   MISC (LANCETS) USE AS DIRECTED TO MONTIOR  BLOOD SUGARS  #1 MO SUPPLY x PRN X 1 YEAR   Entered by:   Shonna Chock   Authorized by:   Marga Melnick MD   Signed by:   Shonna Chock on 02/12/2008   Method used:   Print then Give to Patient   RxID:   [id number] FREESTYLE LITE TEST   STRP (GLUCOSE BLOOD) USE AS DIRECTED  #18MO SUPPLY x PRN   Entered by:   Shonna Chock   Authorized by:   Marga Melnick MD   Signed  by:   Shonna Chock on 02/12/2008   Method used:   Print then Give to Patient   RxID:   [id number]  RX'S WERE FAXED TO [facility]./Chrae Malloy  February 12, 2008 5:24 PM

## 2010-09-14 NOTE — Assessment & Plan Note (Signed)
Summary: diabetic check/med refill /cbs   Vital Signs:  Patient profile:   60 year old female Height:      62 inches Weight:      224.6 pounds BMI:     41.23 Temp:     97.9 degrees F oral Pulse rate:   76 / minute Resp:     16 per minute BP sitting:   138 / 60  (left arm) Cuff size:   large  Vitals Entered By: Shonna Chock CMA (May 12, 2010 3:29 PM) CC: DM and med follow-up, Type 2 diabetes mellitus follow-up, Lower Extremity Joint pain   CC:  DM and med follow-up, Type 2 diabetes mellitus follow-up, and Lower Extremity Joint pain.  History of Present Illness:   The patient reports self managed hypoglycemia  ( glucose as low as 73)and weight gain of 10 #, but denies polyuria, polydipsia, blurred vision, and numbness of extremities.  Other symptoms include chest pain attributed to asthma as per Dr Allyson Sabal.  The patient denies the following symptoms: neuropathic pain, vomiting, orthostatic symptoms, poor wound healing, intermittent claudication, vision loss, and foot ulcer.  Since the last visit the patient reports fair dietary compliance, compliance with medications, and exercising regularly as walking ( limited by stenosis).  The patient has been measuring capillary blood glucose before breakfast  (87-137)and after dinner ( 160-180).  Since the last visit, the patient reports having had eye care by an ophthalmologist  but  no foot care.        The patient also presents with Lower Extremity calf  pain last week.  The patient reports swelling  in calf & ankle, but denies decreased ROM and weakness.   The pain began suddenly and with no injury but after prolonged walking.  The pain is described as aching.  The patient denies the following symptoms: fever, rash, photosensitivity, eye symptoms, diarrhea, and dysuria. Rx: none , but swelling  resolved with elevation.Pain  recurred as of 09/29. PMH of lumabar stenosis.No PMH of DVT.   Allergies: 1)  ! * Plavix  Review of Systems GI:  Denies  bloody stools, dark tarry stools, and indigestion; Rare dysphagia; on PPI OTC once daily . Followed by Dr Matthias Hughs.  Physical Exam  General:  in no acute distress; alert,appropriate and cooperative throughout examination Mouth:  Oral mucosa and oropharynx without lesions or exudates.  Teeth in good repair. No pharyngeal erythema.   Neck:  No deformities, masses, or tenderness noted. Lungs:  Normal respiratory effort, chest expands symmetrically. Lungs are clear to auscultation, no crackles or wheezes. Heart:  normal rate, regular rhythm, no gallop, no rub, no JVD, and grade 1 /6 systolic murmur.   Pulses:  R and L carotid,radial,dorsalis pedis and posterior tibial pulses are full and equal bilaterally. Prominent veins LLE  Extremities:  No clubbing, cyanosis, edema. Negative Homan's. Great toe nails absent Neurologic:  alert & oriented X3, strength normal in all extremities, sensation intact to light touch, and DTRs symmetrical and normal.   Skin:  Intact without suspicious lesions or rashes Psych:  memory intact for recent and remote, normally interactive, and good eye contact.     Impression & Recommendations:  Problem # 1:  LEG PAIN, LEFT (ICD-729.5) Clinically no DVT; probably due to referred pain from Spinal Stenosis  Problem # 2:  DIABETES MELLITUS, TYPE II (ICD-250.00)  Her updated medication list for this problem includes:    Metformin Hcl 1000 Mg Tabs (Metformin hcl) .[name]... Take 1 tablet by mouth  twice a day    Accupril 40 Mg Tabs (Quinapril hcl) .[name]... 1 by mouth qd    Amaryl 2 Mg Tabs (Glimepiride) .[name]... Take 1/2 tablet daily    Actos 45 Mg Tabs (Pioglitazone hcl) .[name]... 1/2 once daily    Aspirin 325 Mg Tabs (Aspirin) .[name]... 1 by mouth once daily  Problem # 3:  OTHER DYSPHAGIA (ICD-787.29) RARE   Problem # 4:  HYPERTENSION (ICD-401.9)  Her updated medication list for this problem includes:    Accupril 40 Mg Tabs (Quinapril hcl) .[name]... 1 by mouth qd    Atenolol 50 Mg Tabs  (Atenolol) .[name]... 1 by mouth qd    Amlodipine Besylate 10 Mg Tabs (Amlodipine besylate) .[name]... 1 by mouth qd  Problem # 5:  HYPERLIPIDEMIA (ICD-272.4)  Her updated medication list for this problem includes:    Crestor 10 Mg Tabs (Rosuvastatin calcium) .[name]... 1 by mouth at bedtime  Complete Medication List: 1)  Metformin Hcl 1000 Mg Tabs (Metformin hcl) .... Take 1 tablet by mouth twice a day 2)  Accupril 40 Mg Tabs (Quinapril hcl) .[name].. 1 by mouth qd 3)  Atenolol 50 Mg Tabs (Atenolol) .[name].. 1 by mouth qd 4)  Paxil Cr 25 Mg Tb24 (Paroxetine hcl) .[name].. 1 by mouth qd 5)  Estradiol 0.5 Mg Tabs (Estradiol) .[name].. 1 by mouth qd 6)  Amaryl 2 Mg Tabs (Glimepiride) .... Take 1/2 tablet daily 7)  Lumigan 0.03 % Soln (Bimatoprost) .[name].. 1 x daily 8)  Celebrex 200 Mg Caps (Celecoxib) .[name].. 1 by mouth bid 9)  Actos 45 Mg Tabs (Pioglitazone hcl) .... 1/2 once daily 10)  Lorazepam 0.5 Mg Tabs (Lorazepam) .[name].. 1 tab once daily prn 11)  Amlodipine Besylate 10 Mg Tabs (Amlodipine besylate) .[name].. 1 by mouth qd 12)  Crestor 10 Mg Tabs (Rosuvastatin calcium) .[name].. 1 by mouth at bedtime 13)  Bactroban 2 % Oint (Mupirocin) .... Use as directed daily 14)  Multivitamins Tabs (Multiple vitamin) .[name].. 1 by mouth once daily 15)  Aspirin 325 Mg Tabs (Aspirin) .[name].. 1 by mouth once daily 16)  Vitamin C 1000 Mg Tabs (Ascorbic acid) .[name].. 1 by mouth once daily 17)  Darvon-n 100 Mg Tabs (Propoxyphene napsylate) .[name].. 1-2 by mouth once daily as needed 18)  Tylenol Arthritis Pain 650 Mg Tbcr (Acetaminophen) .[name].. 1 by mouth in the am, 2 by mouth in the pm 19)  Melatonin 5 Mg Tabs (Melatonin) .[name].. 1 by mouth at bedtime 20)  Mega Red  .[name].. 1 by mouth once daily 21)  Mucinex 600 Mg Tb12 (Guaifenesin) .... 2 by mouth as needed 22)  Herb Lax  .Marland KitchenMarland Kitchen. 1 by mouth once daily 23)  Spiru-tein in 1% Milk  .[name].. 7 days/week 24)  Freestyle Freedom Lite W/device Kit (Blood glucose monitoring suppl) .... Use as directed 25)  Freestyle Lite Test Strp  (Glucose blood) .... Use as directed 26)  Freestyle Lancets Misc (Lancets) .... Use as directed to montior blood sugars 27)  Effient 10 Mg Tabs (Prasugrel hcl) .[name].. 1 by mouth once daily 28)  Symbicort 160-4.5 Mcg/act Aero (Budesonide-formoterol fumarate) .[name].. 1-2 puffs q 12 hrs ; gargle after use 29)  Tramadol Hcl 50 Mg Tabs (Tramadol hcl) .[name].. 1 every 6 hrs for pain ; do not take within 4 hours of paxil 30)  Proventil Hfa 108 (90 Base) Mcg/act Aers (Albuterol sulfate) .[name].. 1-2 puffs every 4-6 hours as needed 31)  Promethazine-codeine 6.25-10 Mg/43ml Syrp (Promethazine-codeine) .[name].. 1 teaspoon every 6 hours as needed  Other Orders: Admin 1st Vaccine (32202) Flu Vaccine 33yrs + (54270) Flu Vaccine Consent Questions     Do you have a history of severe allergic reactions to this vaccine? no    Any prior history of allergic reactions to egg and/or gelatin? no    Do you have a sensitivity to the preservative Thimersol? no    Do you have a past history of Guillan-Barre Syndrome? no    Do you currently have an acute febrile illness? no    Have you ever had a severe reaction to latex? no    Vaccine information given and explained to patient? yes    Are you currently pregnant? no    Lot Number:[id number]   Exp Date:02/10/2011   Site Given  Right Deltoid IM  Other Orders: Admin 1st Vaccine (62376) Flu Vaccine 74yrs + (28315)  Patient Instructions: 1)  Hold the Glymeperide Mon 05/15/2010 for fasting labs @ Dr Hazle Coca  office. See Diagnoses for Codes: 2)  BMP ; 3)  Hepatic Panel ;: 4)  Lipid Panel ; 5)  TSH ; 6)  CBC w/ Diff ;: 7)  HbgA1C ; 8)  Urine Microalbumin . 9)  Check your blood sugars regularly. If your readings are usually above : 150or below 90 you should contact our office. 10)  Check your feet each night for sore areas, calluses or signs of infection. 11)  Check your Blood Pressure regularly. If it is above: 135/85 ON AVERAGE  you should make an appointment. Marland Kitchenlbflu

## 2010-09-14 NOTE — Progress Notes (Signed)
Summary: refill  Phone Note Refill Request Message from:  Pharmacy [facility] fax [phone]  mupirocin 2% ointment 22g  Initial call taken by: Barb Merino,  February 23, 2009 9:43 AM  Follow-up for Phone Call        LAST FILLED #15 gram, 06-22-08, LAST OV 01-17-09....Marland KitchenMarland KitchenFelecia Deloach CMA  February 25, 2009 8:58 AM  Additional Follow-up for Phone Call Additional follow up Details #1::        OK X 1 Additional Follow-up by: Marga Melnick MD,  February 25, 2009 11:37 AM    Prescriptions: BACTROBAN 2 %  OINT (MUPIROCIN) use as directed daily  #15grams x 0   Entered by:   Jeremy Johann CMA   Authorized by:   Marga Melnick MD   Signed by:   Jeremy Johann CMA on 02/25/2009   Method used:   Faxed to ...       [facility]* (retail)       [address]       [place], [place]  [id number]       Ph: [phone]       Fax: [phone]   RxID:   [id number]

## 2010-09-14 NOTE — Progress Notes (Signed)
Summary: verify dose  Phone Note From Pharmacy Message from:  Fax from Pharmacy on March 20, 2010 10:37 AM  Caller: [facility]* Call For: verify dose  Summary of Call: fax from [facility] "patient states taking 1/2 to 1 tab daily - is this correct???    actos 45mg  tab Initial call taken by: Okey Regal [place],  March 20, 2010 10:44 AM  Follow-up for Phone Call        I called patient and left message on VM informing patient to please call to discuss who told her to take 1/2-1 tab of actos, we have on file 1/2tab and thats the way it was filled previously Follow-up by: Shonna Chock CMA,  March 21, 2010 8:21 AM  Additional Follow-up for Phone Call Additional follow up Details #1::        I left message with female to have patient return call when avaliable Additional Follow-up by: Shonna Chock CMA,  March 21, 2010 2:42 PM    Additional Follow-up for Phone Call Additional follow up Details #2::    Left message on machine informing patient 3rd attempt to reach her, if patient with any concerns about meds patient is to call us. We got note from pharmacy questioning instructions for actos Follow-up by: Shonna Chock CMA,  March 23, 2010 9:13 AM

## 2010-09-14 NOTE — Letter (Signed)
Summary: [facility]  [facility]   Imported By: Lanelle Bal 06/25/2008 09:50:38  _____________________________________________________________________  External Attachment:    Type:   Image     Comment:   External Document

## 2010-09-14 NOTE — Letter (Signed)
Summary: [facility]   Imported By: Lanelle Bal 09/08/2010 10:58:20  _____________________________________________________________________  External Attachment:    Type:   Image     Comment:   External Document

## 2010-09-14 NOTE — Progress Notes (Signed)
Summary: hop-refill  Phone Note Refill Request   Refills Requested: Medication #1:  ACTOS 45 MG  TABS 1 by mouth qd [facility]--[phone] [phone]----last filled--03-25-08  Initial call taken by: Freddy Jaksch,  April 26, 2008 2:31 PM      Prescriptions: ACTOS 45 MG  TABS (PIOGLITAZONE HCL) 1 by mouth qd  #30 x 4   Entered by:   Doristine Devoid   Authorized by:   Marga Melnick MD   Signed by:   Doristine Devoid on 04/26/2008   Method used:   Electronically to        [facility]* (retail)       [address]       [place], [place]  [id number]       Ph: [phone]       Fax: [phone]   RxID:   [id number]

## 2010-09-14 NOTE — Letter (Signed)
Summary: [facility]  [facility]   Imported By: Lanelle Bal 08/24/2009 13:47:19  _____________________________________________________________________  External Attachment:    Type:   Image     Comment:   External Document

## 2010-09-14 NOTE — Letter (Signed)
Summary: [facility]  [facility]   Imported By: Vanessa Swaziland 04/29/2007 16:02:03  _____________________________________________________________________  External Attachment:    Type:   Image     Comment:   External Document

## 2010-09-14 NOTE — Consult Note (Signed)
Summary: [facility]  [facility]   Imported By: Lanelle Bal 04/07/2008 08:27:12  _____________________________________________________________________  External Attachment:    Type:   Image     Comment:   External Document

## 2010-09-14 NOTE — Progress Notes (Signed)
Summary: [name] [phone]  Phone Note Refill Request   Refills Requested: Medication #1:  AMARYL 2 MG  TABS (GLIMEPIRIDE) 1/2 tab qd [name] [phone]  Initial call taken by: Roselle Locus,  Dec 22, 2008 7:54 AM    New/Updated Medications: AMARYL 2 MG TABS (GLIMEPIRIDE) take 1/2 tablet daily- OFFICE VISIT AND LABS DUE   Prescriptions: AMARYL 2 MG TABS (GLIMEPIRIDE) take 1/2 tablet daily- OFFICE VISIT AND LABS DUE  #15 x 0   Entered by:   Doristine Devoid   Authorized by:   Marga Melnick MD   Signed by:   Doristine Devoid on 12/22/2008   Method used:   Electronically to        [facility]* (retail)       [address]       [place], [place]  [id number]       Ph: [phone]       Fax: [phone]   RxID:   [id number]

## 2010-09-14 NOTE — Progress Notes (Signed)
Summary: call-a-nurse  Phone Note Outgoing Call   Details for Reason: [facility] Triage Call Report Triage Record Num: [id number] Operator: Estevan Oaks Patient Name: Gianna Calef Call Date & Time: 09/02/2010 4:39:48PM Patient Phone: [phone] PCP: Marga Melnick Patient Gender: Female PCP Fax : [phone] Patient DOB: March 22, 1951 Practice Name: [facility] Reason for Call: Ms Friedhoff is calling about ^'d BS. BS 212 after dinner last night; 204 this am. Last was 194. Meds changed in Nov, now gets Janumet. Feels dizzy and has tightness in her chest but sts she has Panic disorder and not sure if it's the sx's she experiences with that condition. Also reports freq urination which has been increasing over last 1 week. Rn adv'd to have adult drive to UC or ED. She will go to UC. Protocol(s) Used: Diabetes: Control Problems Recommended Outcome per Protocol: See ED Immediately Reason for Outcome: Signs of dehydration Care Advice:  ~ 01/  Follow-up for Phone Call        MD aware patient with appointment tomorrow.  Follow-up by: Shonna Chock CMA,  September 04, 2010 10:25 AM

## 2010-09-14 NOTE — Progress Notes (Signed)
Summary: REFILL - DR [name]  Phone Note Refill Request   Refills Requested: Medication #1:  QUINAPRIL HCL 40MG  TAKE 1 TABLET ONCE DAILY RECEVIED [facility] [place] - [phone] -- [facility] [id number]  Initial call taken by: Okey Regal [place],  November 05, 2007 8:35 AM      Prescriptions: ACCUPRIL 40 MG TABS (QUINAPRIL HCL) 1 by mouth qd  #30 x 3   Entered by:   Shonna Chock   Authorized by:   Marga Melnick MD   Signed by:   Shonna Chock on 11/05/2007   Method used:   Electronically sent to ...       [facility]*       [address]       [place], [place]  [address]       Ph: [phone] or [phone]       Fax: [phone]   RxID:   [id number]

## 2010-09-14 NOTE — Letter (Signed)
Summary: [facility]  [facility]   Imported By: Lanelle Bal 05/30/2010 16:20:46  _____________________________________________________________________  External Attachment:    Type:   Image     Comment:   External Document

## 2010-09-14 NOTE — Progress Notes (Signed)
Summary: Hop--Refill mupirocin 2%  Phone Note Refill Request   Refills Requested: Medication #1:  Mupirocin 2% [facility] --[phone]  [phone]  Initial call taken by: Freddy Jaksch,  March 08, 2008 8:46 AM  Follow-up for Phone Call        hop advise on tthis rx please .Kandice Hams  March 08, 2008 2:44 PM  Follow-up by: Kandice Hams,  March 08, 2008 2:44 PM  Additional Follow-up for Phone Call Additional follow up Details #1::        OK X 1 Additional Follow-up by: Marga Melnick MD,  March 08, 2008 5:31 PM      Prescriptions: BACTROBAN 2 %  OINT (MUPIROCIN) use as directed daily  #15grams x 0   Entered by:   Kandice Hams   Authorized by:   Marga Melnick MD   Signed by:   Kandice Hams on 03/09/2008   Method used:   Electronically sent to ...       [facility]*       [address]       [place], [place]  [address]       Ph: [phone]       Fax: [phone]   RxID:   [id number]

## 2010-09-14 NOTE — Progress Notes (Signed)
Summary: actos request  Phone Note Refill Request Message from:  Fax from Pharmacy on October 27, 2007 3:43 PM  Refills Requested: Medication #1:  ACTOS 45 MG  TABS 1 by mouth qd Initial call taken by: Doristine Devoid,  October 27, 2007 3:43 PM      Prescriptions: ACTOS 45 MG  TABS (PIOGLITAZONE HCL) 1 by mouth qd  #30 x 5   Entered by:   Doristine Devoid   Authorized by:   Marga Melnick MD   Signed by:   Doristine Devoid on 10/27/2007   Method used:   Electronically sent to ...       [facility]*       [address]       [place], [place]  [address]       Ph: [phone] or [phone]       Fax: [phone]   RxID:   [id number]

## 2010-09-14 NOTE — Progress Notes (Signed)
Summary: Refill Request  Phone Note Refill Request Call back at [phone] Message from:  Pharmacy on January 23, 2010 10:42 AM  Refills Requested: Medication #1:  METFORMIN HCL 1000 MG TABS Take 1 tablet by mouth twice a day   Dosage confirmed as above?Dosage Confirmed   Supply Requested: 1 month   Last Refilled: 12/21/2009 [facility]  Next Appointment Scheduled: none Initial call taken by: Harold Barban,  January 23, 2010 10:42 AM    Prescriptions: METFORMIN HCL 1000 MG TABS (METFORMIN HCL) Take 1 tablet by mouth twice a day  #60 Each x 4   Entered by:   Shonna Chock   Authorized by:   Marga Melnick MD   Signed by:   Shonna Chock on 01/23/2010   Method used:   Electronically to        [facility]* (retail)       [address]       [place], [place]  [address]       Ph: [phone]       Fax: [phone]   RxID:   [id number]

## 2010-09-14 NOTE — Assessment & Plan Note (Signed)
Summary: difficulty breathing,heart pounding, face flushing//fd   Vital Signs:  Patient profile:   60 year old female Height:      62.75 inches Weight:      226 pounds O2 Sat:      97 % on Room air Temp:     98.4 degrees F oral Pulse rate:   64 / minute Resp:     17 per minute BP sitting:   130 / 68  (left arm)  Vitals Entered By: Jeremy Johann CMA (August 19, 2009 2:47 PM)  O2 Flow:  Room air CC: difficulty breathing, face flushing Comments REVIEWED MED LIST, PATIENT AGREED DOSE AND INSTRUCTION CORRECT    CC:  difficulty breathing and face flushing.  History of Present Illness: She has been having asthma since 05/25/2009, exacerbation in [place] from cleaning products in  hotel rooms. Using QVAR two times a day as needed ; she thought it was a rescue agent. No albutral on hand. Advair 250/50 q 12 hrs X 2 weeks . Symbicort given @ last visit never used. Zpack X 2 days from [facility] records reviewed. Not checking glucoses.  Allergies: 1)  ! * Plavix  Review of Systems General:  Denies chills, fever, and sweats. ENT:  Denies earache, nasal congestion, and sinus pressure; No frontal headacche, facial pain , or purulence. Resp:  Complains of cough, shortness of breath, sputum productive, and wheezing; Scant light green sputum.  Physical Exam  General:  well-nourished,in no acute distress; alert,appropriate and cooperative throughout examination Ears:  External ear exam shows no significant lesions or deformities.  Otoscopic examination reveals clear canals, tympanic membranes are intact bilaterally without bulging, retraction, inflammation or discharge. Hearing is grossly normal bilaterally. Nose:  External nasal examination shows no deformity or inflammation. Nasal mucosa are pink and moist without lesions or exudates. Mouth:  Oral mucosa and oropharynx without lesions or exudates.  Teeth in good repair. Lungs:  Normal respiratory effort, chest expands symmetrically. Lungs are clear to  auscultation, no crackles or wheezes. Heart:  Normal rate and regular rhythm. S1 and S2 normal without gallop,  click, rub. S4. Grade 1 sys murmur Skin:  Intact without suspicious lesions or rashes Cervical Nodes:  No lymphadenopathy noted Axillary Nodes:  No palpable lymphadenopathy   Impression & Recommendations:  Problem # 1:  ASTHMA NOS W/ACUTE EXACERBATION (ICD-493.92)  The following medications were removed from the medication list:    Azmacort 75 Mcg/act Aers (Triamcinolone acetonide) .[name]... 2 puffs 2-4 times daily    Albuterol 90 Mcg/act Aers (Albuterol) .[name]... Prn    Qvar 80 Mcg/act Aers (Beclomethasone dipropionate) .[name]... 1-2 puffs two times a day Her updated medication list for this problem includes:    Symbicort 160-4.5 Mcg/act Aero (Budesonide-formoterol fumarate) .[name]... 1-2 puffs q 12 hrs ; gargle after use    Singulair 10 Mg Tabs (Montelukast sodium) .[name]... 1 once daily  Complete Medication List: 1)  Metformin Hcl 1000 Mg Tabs (Metformin hcl) .... Take 1 tablet by mouth twice a day 2)  Accupril 40 Mg Tabs (Quinapril hcl) .[name].. 1 by mouth qd 3)  Atenolol 50 Mg Tabs (Atenolol) .[name].. 1 by mouth qd 4)  Paxil Cr 25 Mg Tb24 (Paroxetine hcl) .[name].. 1 by mouth qd 5)  Estradiol 0.5 Mg Tabs (Estradiol) .[name].. 1 by mouth qd 6)  Amaryl 2 Mg Tabs (Glimepiride) .... Take 1/2 tablet daily- office visit and labs due 7)  Timoptic-xe 0.25 % Solg (Timolol maleate) .... Two times a day 8)  Celebrex 200  Mg Caps (Celecoxib) .[name].. 1 by mouth bid 9)  Actos 45 Mg Tabs (Pioglitazone hcl) .... 1/2 once daily 10)  Lorazepam 0.5 Mg Tabs (Lorazepam) .[name].. 1 tab once daily prn 11)  Amlodipine Besylate 10 Mg Tabs (Amlodipine besylate) .[name].. 1 by mouth qd 12)  Crestor 10 Mg Tabs (Rosuvastatin calcium) .[name].. 1 by mouth at bedtime 13)  Bactroban 2 % Oint (Mupirocin) .... Use as directed daily 14)  Multivitamins Tabs (Multiple vitamin) .[name].. 1 by mouth once daily 15)  Aspirin 325 Mg Tabs (Aspirin) .[name].. 1 by mouth once  daily 16)  Vitamin C 1000 Mg Tabs (Ascorbic acid) .[name].. 1 by mouth once daily 17)  Darvon-n 100 Mg Tabs (Propoxyphene napsylate) .[name].. 1-2 by mouth once daily as needed 18)  Tylenol Arthritis Pain 650 Mg Tbcr (Acetaminophen) .[name].. 1 by mouth in the am, 2 by mouth in the pm 19)  Melatonin 5 Mg Tabs (Melatonin) .[name].. 1 by mouth at bedtime 20)  Fish Oil 1000 Mg Caps (Omega-3 fatty acids) .[name].. 1 by mouth once daily 21)  Mucinex 600 Mg Tb12 (Guaifenesin) .... 2 by mouth as needed 22)  Herb Lax  .Marland KitchenMarland Kitchen. 1 by mouth once daily 23)  Spiru-tein in 1% Milk  .[name].. 7 days/week 24)  Freestyle Freedom Lite W/device Kit (Blood glucose monitoring suppl) .... Use as directed 25)  Freestyle Lite Test Strp (Glucose blood) .... Use as directed 26)  Freestyle Lancets Misc (Lancets) .... Use as directed to montior blood sugars 27)  Effient 10 Mg Tabs (Prasugrel hcl) .[name].. 1 by mouth once daily 28)  Symbicort 160-4.5 Mcg/act Aero (Budesonide-formoterol fumarate) .[name].. 1-2 puffs q 12 hrs ; gargle after use 29)  Tramadol Hcl 50 Mg Tabs (Tramadol hcl) .[name].. 1 every 6 hrs for pain ; do not take within 4 hours of paxil 30)  Singulair 10 Mg Tabs (Montelukast sodium) .[name].. 1 once daily  Patient Instructions: 1)  Discuss alternatives to Timoptic to allow use of albuteral rescue agent for asthma. Symbicort 1-2 puffs every 12 hrs ; gargle & spit after use. Stop Advair, Pulmicort & QVAR. Singulair 10 mg once daily  2)  Check your blood sugars regularly. If your readings are usually above : 150 or below 80 you should contact our office. 3)  See your eye doctor yearly to check for diabetic eye damage. 4)  Check your feet each night for sore areas, calluses or signs of infection. Prescriptions: SINGULAIR 10 MG TABS (MONTELUKAST SODIUM) 1 once daily  #14 x 0   Entered and Authorized by:   Marga Melnick MD   Signed by:   Marga Melnick MD on 08/19/2009   Method used:   Historical   RxID:   [id number]

## 2010-09-14 NOTE — Consult Note (Signed)
Summary: [facility]   Imported By: Lanelle Bal 02/24/2008 11:43:35  _____________________________________________________________________  External Attachment:    Type:   Image     Comment:   External Document

## 2010-09-14 NOTE — Progress Notes (Signed)
Summary: sob, difficulty breathing, flushing  Phone Note Call from Patient Call back at Home Phone [phone]   Caller: Patient Summary of Call: pt c/o difficulty breathing, face flushing red and hot,SOB,fatigue, and heart pounding. pt denies any chest pressure or pain, numbness or tingling, or dizziness. Pt advise ED or UC. pt states that she will call cardio to see if she can't get in to see him if not she will go to UC. pt also state that she would like to come in to see dr hopper, appt schedule for tomorrow but advise need to be seen prior to appt, pt ok...............Marland KitchenFelecia Deloach CMA  August 17, 2009 2:35 PM

## 2010-09-14 NOTE — Progress Notes (Signed)
Summary: Refill Request  Phone Note Refill Request Call back at [phone] Message from:  Pharmacy on Jan 02, 2010 8:51 AM  Refills Requested: Medication #1:  ACTOS 45 MG  TABS 1/2 once daily [BMN]   Dosage confirmed as above?Dosage Confirmed   Supply Requested: 1 month   Last Refilled: 12/06/2009  Medication #2:  ATENOLOL 50 MG  TABS 1 by mouth qd   Dosage confirmed as above?Dosage Confirmed   Supply Requested: 1 month   Last Refilled: 12/06/2009 [facility]  Next Appointment Scheduled: none Initial call taken by: Harold Barban,  Jan 02, 2010 8:52 AM    Prescriptions: ACTOS 45 MG  TABS (PIOGLITAZONE HCL) 1/2 once daily Brand medically necessary #30 Each x 3   Entered by:   Shonna Chock   Authorized by:   Marga Melnick MD   Signed by:   Shonna Chock on 01/02/2010   Method used:   Electronically to        [facility]* (retail)       [address]       [place], [place]  [address]       Ph: [phone]       Fax: [phone]   RxID:   [id number] ATENOLOL 50 MG  TABS (ATENOLOL) 1 by mouth qd  #30 Each x 3   Entered by:   Shonna Chock   Authorized by:   Marga Melnick MD   Signed by:   Shonna Chock on 01/02/2010   Method used:   Electronically to        [facility]* (retail)       [address]       [place], [place]  [address]       Ph: [phone]       Fax: [phone]   RxID:   [id number]

## 2010-09-14 NOTE — Assessment & Plan Note (Signed)
Summary: [facility] ASTHMA. FOLLOWUP LAB   Vital Signs:  Patient profile:   60 year old female Weight:      220.2 pounds BMI:     39.46 O2 Sat:      96 % Temp:     97.6 degrees F oral Pulse rate:   75 / minute Resp:     16 per minute BP sitting:   136 / 70  (left arm) Cuff size:   large  Vitals Entered By: Shonna Chock (May 25, 2009 12:17 PM) CC: Asthma Follow-Up, Follow-up on labs  Comments REVIEWED MED LIST, PATIENT AGREED DOSE AND INSTRUCTION CORRECT    CC:  Asthma Follow-Up and Follow-up on labs .  History of Present Illness: Labs reviewed: anemia stable , normal B12,folate, & iron panel.Last  Endo & colonoscopy negative 2009 by Dr Matthias Hughs.Lipids incredibly good on Crestor 10 mg once daily for > 12 months from Dr Elsie Lincoln. FBS 111 today; not checking 2 hrs after a meal. Weight up ; no specific diet. CVE in Cardiac Rehab.Low glycemic index/ load discussed.  Allergies: 1)  ! * Plavix  Past History:  Past Medical History: Diabetes mellitus, type II, A1c 11.2 Hyperlipidemia PSO; Multinodular goiter; Panic Disorder, Dr Raquel James Asthma  Past Surgical History: Hysterectomy & BSO for  PSO CATH-2002,2006; Colonoscopy & Upper Endo neg 12/09 by Dr Matthias Hughs, due 2019 PTCA/stent 05/2008, Dr Elsie Lincoln  Review of Systems General:  Complains of fatigue; denies weight loss. Eyes:  Denies blurring, double vision, and vision loss-both eyes; Last Ophth 07/2008, no retinopathy. ENT:  Denies difficulty swallowing, hoarseness, and nosebleeds. CV:  Complains of shortness of breath with exertion; denies chest pain or discomfort, difficulty breathing at night, difficulty breathing while lying down, leg cramps with exertion, lightheadness, near fainting, swelling of feet, and swelling of hands. Resp:  Complains of cough, shortness of breath, and wheezing; denies sputum productive; ASsthma flare with weather change ; rescue used 2X/ week for past month despite Advair two times a day . GI:  Denies  abdominal pain, bloody stools, dark tarry stools, and indigestion. GU:  Denies hematuria. Derm:  Denies poor wound healing. Neuro:  Complains of numbness and tingling; Occa N&T in hands @ night. Psych:  Complains of anxiety; denies depression. Endo:  Denies excessive hunger, excessive thirst, and excessive urination. Heme:  Denies abnormal bruising and bleeding.  Physical Exam  General:  well-nourished,in no acute distress; alert,appropriate and cooperative throughout examination Neck:  No deformities, masses, or tenderness noted. R thyroid  lobe > L Lungs:  Normal respiratory effort, chest expands symmetrically. Lungs are clear to auscultation, no crackles or wheezes. Heart:  normal rate, regular rhythm, no gallop, no rub, no JVD, no HJR, and grade 1 /6 systolic murmur.   Abdomen:  Bowel sounds positive,abdomen soft and non-tender without masses, organomegaly or hernias noted. RUQ dullness Pulses:  R and L carotid,radial,dorsalis pedis and posterior tibial pulses are full and equal bilaterally Extremities:  No clubbing, cyanosis, edema. Neurologic:  alert & oriented X3.   Skin:  Intact without suspicious lesions or rashes Cervical Nodes:  No lymphadenopathy noted Axillary Nodes:  No palpable lymphadenopathy Psych:  memory intact for recent and remote, normally interactive, and good eye contact.     Impression & Recommendations:  Problem # 1:  HYPERTENSION (ICD-401.9) Controlled Her updated medication list for this problem includes:    Accupril 40 Mg Tabs (Quinapril hcl) .[name]... 1 by mouth qd    Atenolol 50 Mg Tabs (Atenolol) .[name]... 1 by mouth  qd    Amlodipine Besylate 10 Mg Tabs (Amlodipine besylate) .[name]... 1 by mouth qd  Problem # 2:  DIABETES MELLITUS, TYPE II (ICD-250.00) A1c in NON Diabetes range Her updated medication list for this problem includes:    Metformin Hcl 1000 Mg Tabs (Metformin hcl) .[name]... Take 1 tablet by mouth twice a day    Accupril 40 Mg Tabs (Quinapril hcl)  .[name]... 1 by mouth qd    Amaryl 2 Mg Tabs (Glimepiride) .[name]... Take 1/2 tablet daily- office visit and labs due    Actos 45 Mg Tabs (Pioglitazone hcl) .[name]... 1/2 once daily    Aspirin 325 Mg Tabs (Aspirin) .[name]... 1 by mouth once daily  Problem # 3:  ASTHMA (ICD-493.90) Recent flare The following medications were removed from the medication list:    Advair Diskus 250-50 Mcg/dose Misc (Fluticasone-salmeterol) ..... Use one puff twice daily, rinse, gargle and spit after use. Her updated medication list for this problem includes:    Azmacort 75 Mcg/act Aers (Triamcinolone acetonide) .[name]... 2 puffs 2-4 times daily    Albuterol 90 Mcg/act Aers (Albuterol) .[name]... Prn    Qvar 80 Mcg/act Aers (Beclomethasone dipropionate) .[name]... 1-2 puffs two times a day    Symbicort 160-4.5 Mcg/act Aero (Budesonide-formoterol fumarate) .[name]... 1-2 puffs q 12 hrs ; gargle after use  Problem # 4:  HYPERLIPIDEMIA (ICD-272.4) Lipids @ goal Her updated medication list for this problem includes:    Crestor 10 Mg Tabs (Rosuvastatin calcium) .[name]... 1 by mouth at bedtime  Problem # 5:  UNSPECIFIED ANEMIA (ICD-285.9)  Complete Medication List: 1)  Metformin Hcl 1000 Mg Tabs (Metformin hcl) .... Take 1 tablet by mouth twice a day 2)  Accupril 40 Mg Tabs (Quinapril hcl) .[name].. 1 by mouth qd 3)  Atenolol 50 Mg Tabs (Atenolol) .[name].. 1 by mouth qd 4)  Paxil Cr 25 Mg Tb24 (Paroxetine hcl) .[name].. 1 by mouth qd 5)  Estradiol 0.5 Mg Tabs (Estradiol) .[name].. 1 by mouth qd 6)  Amaryl 2 Mg Tabs (Glimepiride) .... Take 1/2 tablet daily- office visit and labs due 7)  Timoptic-xe 0.25 % Solg (Timolol maleate) 8)  Celebrex 200 Mg Caps (Celecoxib) .[name].. 1 by mouth bid 9)  Actos 45 Mg Tabs (Pioglitazone hcl) .... 1/2 once daily 10)  Azmacort 75 Mcg/act Aers (Triamcinolone acetonide) .... 2 puffs 2-4 times daily 11)  Albuterol 90 Mcg/act Aers (Albuterol) .... Prn 12)  Lorazepam 0.5 Mg Tabs (Lorazepam) .[name].. 1 tab once daily prn 13)  Amlodipine Besylate 10  Mg Tabs (Amlodipine besylate) .[name].. 1 by mouth qd 14)  Crestor 10 Mg Tabs (Rosuvastatin calcium) .[name].. 1 by mouth at bedtime 15)  Bactroban 2 % Oint (Mupirocin) .... Use as directed daily 16)  Multivitamins Tabs (Multiple vitamin) .[name].. 1 by mouth once daily 17)  Aspirin 325 Mg Tabs (Aspirin) .[name].. 1 by mouth once daily 18)  Vitamin C 1000 Mg Tabs (Ascorbic acid) .[name].. 1 by mouth once daily 19)  Darvon-n 100 Mg Tabs (Propoxyphene napsylate) .[name].. 1-2 by mouth once daily as needed 20)  Tylenol Arthritis Pain 650 Mg Tbcr (Acetaminophen) .[name].. 1 by mouth in the am, 2 by mouth in the pm 21)  Melatonin 5 Mg Tabs (Melatonin) .[name].. 1 by mouth at bedtime 22)  Fish Oil 1000 Mg Caps (Omega-3 fatty acids) .[name].. 1 by mouth once daily 23)  Mucinex 600 Mg Tb12 (Guaifenesin) .... 2 by mouth as needed 24)  Herb Lax  .Marland KitchenMarland Kitchen. 1 by mouth once daily 25)  Spiru-tein in  1% Milk  .[name].. 7 days/week 26)  Freestyle Freedom Lite W/device Kit (Blood glucose monitoring suppl) .... Use as directed 27)  Freestyle Lite Test Strp (Glucose blood) .... Use as directed 28)  Freestyle Lancets Misc (Lancets) .... Use as directed to montior blood sugars 29)  Effient 10 Mg Tabs (Prasugrel hcl) .[name].. 1 by mouth once daily 30)  Qvar 80 Mcg/act Aers (Beclomethasone dipropionate) .[name].. 1-2 puffs two times a day 31)  Symbicort 160-4.5 Mcg/act Aero (Budesonide-formoterol fumarate) .[name].. 1-2 puffs q 12 hrs ; gargle after use 32)  Tramadol Hcl 50 Mg Tabs (Tramadol hcl) .[name].. 1 every 6 hrs for pain ; do not take within 4 hours of paxil  Other Orders: Hemoccult Cards -3 specimans (take home) ([id number])  Patient Instructions: 1)  Follow the 40 /35/30 /25 program we discussed. Consider the [employer] on Celebrex as we discussed. 2)  Please schedule a follow-up appointment in 4 months. 3)  Lipid Panel prior to visit, ICD-9:272.4 4)  HbgA1C prior to visit, ICD-9:250.00 Prescriptions: TRAMADOL HCL 50 MG TABS (TRAMADOL HCL) 1 every 6 hrs for pain ; do  not take within 4 hours of Paxil  #30 x 1   Entered and Authorized by:   Marga Melnick MD   Signed by:   Marga Melnick MD on 05/25/2009   Method used:   Print then Give to Patient   RxID:   [id number] SYMBICORT 160-4.5 MCG/ACT AERO (BUDESONIDE-FORMOTEROL FUMARATE) 1-2 puffs q 12 hrs ; gargle after use  #1 x 11   Entered and Authorized by:   Marga Melnick MD   Signed by:   Marga Melnick MD on 05/25/2009   Method used:   Print then Give to Patient   RxID:   [id number]   Laboratory Results    Stool - Occult Blood Hemmoccult #1: negative Hemoccult #2: negative Hemoccult #3: negative  Kit Test Internal QC: Positive   (Normal Range: Negative)

## 2010-09-14 NOTE — Letter (Signed)
Summary: [facility]   Imported By: Lanelle Bal 08/10/2009 13:32:47  _____________________________________________________________________  External Attachment:    Type:   Image     Comment:   External Document

## 2010-09-14 NOTE — Progress Notes (Signed)
Summary: [facility]--refill  Phone Note Refill Request   Refills Requested: Medication #1:  Quinapril hcl 40 mg [facility] --[phone]  Initial call taken by: Freddy Jaksch,  March 04, 2008 8:47 AM      Prescriptions: ACCUPRIL 40 MG TABS (QUINAPRIL HCL) 1 by mouth qd  #30 x 6   Entered by:   Shonna Chock   Authorized by:   Marga Melnick MD   Signed by:   Shonna Chock on 03/04/2008   Method used:   Electronically sent to ...       [facility]*       [address]       [place], [place]  [address]       Ph: [phone]       Fax: [phone]   RxID:   [id number]

## 2010-09-14 NOTE — Assessment & Plan Note (Signed)
Summary: med not agreeing/cbs   Vital Signs:  Patient profile:   60 year old female Weight:      225.4 pounds BMI:     41.38 Pulse rate:   72 / minute Resp:     17 per minute BP sitting:   132 / 80  (left arm) Cuff size:   large  Vitals Entered By: Shonna Chock CMA (July 05, 2010 12:07 PM) CC: Discuss Meds    CC:  Discuss Meds .  History of Present Illness: Headaches, weakness & nausea with Janumet 50/1000mg  twice a day ; she had been taking regular Metformin 1000 mg  1/2 after protein shake in am & 1000 mg with eve meal & 1/2   withy snack at bedtime   which is usually 2 am. She did same thing with Janumet (1/2 , 1 & 1/2 ) , but stil had nausea with full pill. FBS 119-130.  Current Medications (verified): 1)  Atenolol 50 Mg  Tabs (Atenolol) .[name].. 1 By Mouth Qd 2)  Paxil Cr 25 Mg  Tb24 (Paroxetine Hcl) .[name].. 1 By Mouth Qd 3)  Estradiol 0.5 Mg  Tabs (Estradiol) .[name].. 1 By Mouth Qd 4)  Lumigan 0.03 % Soln (Bimatoprost) .[name].. 1 X Daily 5)  Celebrex 200 Mg  Caps (Celecoxib) .[name].. 1 By Mouth Bid 6)  Lorazepam 0.5 Mg  Tabs (Lorazepam) .[name].. 1 Tab Once Daily Prn 7)  Amlodipine Besylate 10 Mg  Tabs (Amlodipine Besylate) .[name].. 1 By Mouth Qd 8)  Crestor 10 Mg Tabs (Rosuvastatin Calcium) .[name].. 1 By Mouth At Bedtime 9)  Bactroban 2 %  Oint (Mupirocin) .... Use As Directed Daily 10)  Multivitamins   Tabs (Multiple Vitamin) .[name].. 1 By Mouth Once Daily 11)  Aspirin 325 Mg Tabs (Aspirin) .[name].. 1 By Mouth Once Daily 12)  Vitamin C 1000 Mg  Tabs (Ascorbic Acid) .[name].. 1 By Mouth Once Daily 13)  Melatonin 5 Mg  Tabs (Melatonin) .[name].. 1 By Mouth At Bedtime 14)  Mega Red .[name].. 1 By Mouth Once Daily 15)  Mucinex 600 Mg  Tb12 (Guaifenesin) .[name].. 1 By Mouth Two Times A Day 16)  Herb Lax .Marland KitchenMarland Kitchen. 1 By Mouth Once Daily 17)  Spiru-Tein in 1% Milk .[name].. 7 Days/week 18)  Freestyle Freedom Lite W/device  Kit (Blood Glucose Monitoring Suppl) .... Use As Directed 19)  Freestyle Lite Test   Strp (Glucose Blood) .... Use As  Directed 20)  Freestyle Lancets   Misc (Lancets) .... Use As Directed To Montior Blood Sugars 21)  Effient 10 Mg Tabs (Prasugrel Hcl) .[name].. 1 By Mouth Once Daily 22)  Symbicort 160-4.5 Mcg/act Aero (Budesonide-Formoterol Fumarate) .[name].. 1-2 Puffs Q 12 Hrs ; Gargle After Use 23)  Tramadol Hcl 50 Mg Tabs (Tramadol Hcl) .[name].. 1 Every 6 Hrs For Pain ; Do Not Take Within 4 Hours of Paxil 24)  Proventil Hfa 108 (90 Base) Mcg/act Aers (Albuterol Sulfate) .[name].. 1-2 Puffs Every 4-6 Hours As Needed 25)  Janumet 50-1000 Mg Tabs (Sitagliptin-Metformin Hcl) .[name].. 1 Two Times A Day With 2 Largest Meals  Allergies: 1)  ! * Plavix  Physical Exam  General:  in no acute distress; alert,appropriate and cooperative throughout examination Lungs:  Normal respiratory effort, chest expands symmetrically. Lungs are clear to auscultation, no crackles or wheezes. Heart:  normal rate, regular rhythm, no gallop, no rub, no JVD, and grade 1 /6 systolic murmur.   Pulses:  R and L carotid,radial,dorsalis pedis and posterior tibial pulses are full and equal  bilaterally Extremities:  No clubbing, cyanosis, edema.   Skin:  Intact without suspicious lesions or rashes   Impression & Recommendations:  Problem # 1:  DIABETES MELLITUS, TYPE II (ICD-250.00)  The following medications were removed from the medication list:    Accupril 40 Mg Tabs (Quinapril hcl) .[name]... 1 by mouth qd    Janumet 50-1000 Mg Tabs (Sitagliptin-metformin hcl) .[name]... 1 two times a day with 2 largest meals    Glimepiride 2 Mg Tabs (Glimepiride) .[name]... 1/2 by mouth once daily Her updated medication list for this problem includes:    Aspirin 325 Mg Tabs (Aspirin) .[name]... 1 by mouth once daily    Metformin Hcl 1000 Mg Tabs (Metformin hcl) .[name]... 1/2 am , 1 eve & 1/2 with snack at bedtime    Januvia 50 Mg Tabs (Sitagliptin phosphate) .[name]... 1/2 am , 1 eve & 1/2 with snack  Complete Medication List: 1)  Atenolol 50 Mg Tabs (Atenolol) .[name].. 1 by mouth qd 2)  Paxil  Cr 25 Mg Tb24 (Paroxetine hcl) .[name].. 1 by mouth qd 3)  Estradiol 0.5 Mg Tabs (Estradiol) .[name].. 1 by mouth qd 4)  Lumigan 0.03 % Soln (Bimatoprost) .[name].. 1 x daily 5)  Celebrex 200 Mg Caps (Celecoxib) .[name].. 1 by mouth bid 6)  Lorazepam 0.5 Mg Tabs (Lorazepam) .[name].. 1 tab once daily prn 7)  Amlodipine Besylate 10 Mg Tabs (Amlodipine besylate) .[name].. 1 by mouth qd 8)  Crestor 10 Mg Tabs (Rosuvastatin calcium) .[name].. 1 by mouth at bedtime 9)  Bactroban 2 % Oint (Mupirocin) .... Use as directed daily 10)  Multivitamins Tabs (Multiple vitamin) .[name].. 1 by mouth once daily 11)  Aspirin 325 Mg Tabs (Aspirin) .[name].. 1 by mouth once daily 12)  Vitamin C 1000 Mg Tabs (Ascorbic acid) .[name].. 1 by mouth once daily 13)  Melatonin 5 Mg Tabs (Melatonin) .[name].. 1 by mouth at bedtime 14)  Mega Red  .[name].. 1 by mouth once daily 15)  Mucinex 600 Mg Tb12 (Guaifenesin) .[name].. 1 by mouth two times a day 16)  Herb Lax  .Marland KitchenMarland Kitchen. 1 by mouth once daily 17)  Spiru-tein in 1% Milk  .[name].. 7 days/week 18)  Freestyle Freedom Lite W/device Kit (Blood glucose monitoring suppl) .... Use as directed 19)  Freestyle Lite Test Strp (Glucose blood) .... Use as directed 20)  Freestyle Lancets Misc (Lancets) .... Use as directed to montior blood sugars 21)  Effient 10 Mg Tabs (Prasugrel hcl) .[name].. 1 by mouth once daily 22)  Symbicort 160-4.5 Mcg/act Aero (Budesonide-formoterol fumarate) .[name].. 1-2 puffs q 12 hrs ; gargle after use 23)  Tramadol Hcl 50 Mg Tabs (Tramadol hcl) .[name].. 1 every 6 hrs for pain ; do not take within 4 hours of paxil 24)  Proventil Hfa 108 (90 Base) Mcg/act Aers (Albuterol sulfate) .[name].. 1-2 puffs every 4-6 hours as needed 25)  Metformin Hcl 1000 Mg Tabs (Metformin hcl) .... 1/2 am , 1 eve & 1/2 with snack at bedtime 26)  Januvia 50 Mg Tabs (Sitagliptin phosphate) .... 1/2 am , 1 eve & 1/2 with snack  Patient Instructions: 1)  Please schedule a follow-up  A1c  in 2 months. 2)  Check your blood sugars regularly. If your readings are  usually above 150 or below 90  OR > 180  two hrs after largest meal you should contact our office. Prescriptions: JANUVIA 50 MG TABS (SITAGLIPTIN PHOSPHATE) 1/2 am , 1 eve & 1/2 with snack  #30 x 5   Entered and Authorized  by:   Marga Melnick MD   Signed by:   Marga Melnick MD on 07/05/2010   Method used:   Print then Give to Patient   RxID:   [id number] METFORMIN HCL 1000 MG TABS (METFORMIN HCL) 1/2 am , 1 eve & 1/2 with snack at bedtime  #180 x 1   Entered and Authorized by:   Marga Melnick MD   Signed by:   Marga Melnick MD on 07/05/2010   Method used:   Print then Give to Patient   RxID:   [id number]    Orders Added: 1)  Est. Patient Level III [84696]

## 2010-09-14 NOTE — Progress Notes (Signed)
Summary: refill atenolol - dr hopper  Phone Note Refill Request   Refills Requested: Medication #1:  ATENOLOL 50 MG  TABS 1 by mouth qd received fax from [facility] - fax [phone]- phone [phone]  Initial call taken by: Okey Regal Spring,  April 16, 2008 10:57 AM      Prescriptions: ATENOLOL 50 MG  TABS (ATENOLOL) 1 by mouth qd  #30 Each x 6   Entered by:   Kandice Hams   Authorized by:   Marga Melnick MD   Signed by:   Kandice Hams on 04/16/2008   Method used:   Faxed to ...       [facility]* (retail)       [address]       [place], [place]  [address]       Ph: [phone]       Fax: [phone]   RxID:   [id number]

## 2010-09-14 NOTE — Progress Notes (Signed)
Summary: rX  Phone Note Refill Request Message from:  Fax from Pharmacy on [facility] [address] fax [phone]  had samples of symbicort 160/4.5 needs new order!  THANKS  Initial call taken by: Barb Merino,  August 30, 2009 3:26 PM  Follow-up for Phone Call        Dr.[name] patient states she is still with cough and would like a cough med. Please advise Follow-up by: Shonna Chock,  August 30, 2009 3:54 PM    New/Updated Medications: PROMETHAZINE-CODEINE 6.25-10 MG/5ML SYRP (PROMETHAZINE-CODEINE) 1 Teaspoon every 6 hours as needed Prescriptions: PROMETHAZINE-CODEINE 6.25-10 MG/5ML SYRP (PROMETHAZINE-CODEINE) 1 Teaspoon every 6 hours as needed  #120cc x 0   Entered by:   Shonna Chock   Authorized by:   Marga Melnick MD   Signed by:   Shonna Chock on 08/30/2009   Method used:   Printed then faxed to ...       [facility]* (retail)       [address]       [place], [place]  [id number]       Ph: [phone]       Fax: [phone]   RxID:   [id number] SYMBICORT 160-4.5 MCG/ACT AERO (BUDESONIDE-FORMOTEROL FUMARATE) 1-2 puffs q 12 hrs ; gargle after use  #1 x 11   Entered by:   Shonna Chock   Authorized by:   Marga Melnick MD   Signed by:   Shonna Chock on 08/30/2009   Method used:   Electronically to        [facility]* (retail)       [address]       [place], [place]  [address]       Ph: [phone]       Fax: [phone]   RxID:   [id number]

## 2010-09-14 NOTE — Progress Notes (Signed)
Summary: HOP-REFILL BACTROBAN  Phone Note Refill Request   Refills Requested: Medication #1:  BACTROBAN 2 %  OINT use as directed daily [facility]--[phone]   LAST FILLED--7.28.09  Initial call taken by: Freddy Jaksch,  June 22, 2008 9:28 AM  Follow-up for Phone Call        last ov 02/12/08, last refill 03/09/08.Kandice Hams  June 22, 2008 11:34 AM  Follow-up by: Kandice Hams,  June 22, 2008 11:34 AM  Additional Follow-up for Phone Call Additional follow up Details #1::        OK X1 Additional Follow-up by: Marga Melnick MD,  June 22, 2008 12:21 PM      Prescriptions: BACTROBAN 2 %  OINT (MUPIROCIN) use as directed daily  #15grams x 0   Entered by:   Kandice Hams   Authorized by:   Marga Melnick MD   Signed by:   Kandice Hams on 06/22/2008   Method used:   Faxed to ...       [facility]* (retail)       [address]       [place], [place]  [address]       Ph: [phone]       Fax: [phone]   RxID:   [id number]

## 2010-09-14 NOTE — Letter (Signed)
Summary: [facility]  [facility]   Imported By: Freddy Jaksch 10/08/2007 10:43:40  _____________________________________________________________________  External Attachment:    Type:   Image     Comment:   External Document

## 2010-09-14 NOTE — Progress Notes (Signed)
Summary: UA/Culture  Phone Note Call from Patient   Summary of Call: Patient came for labs today and would like to know if we could do a UA and Culture on sample provided today. Patient notes that she recently had infection when seen at [facility]. Ok to order UA and culture? Please advise. Initial call taken by: Lucious Groves CMA,  May 18, 2010 12:18 PM  Follow-up for Phone Call        that is fine Follow-up by: Loreen Freud DO,  May 18, 2010 12:55 PM  Additional Follow-up for Phone Call Additional follow up Details #1::        I spoke with patient to see why she requested culture, ? if she is having any symptoms. Patient indicated she is w/o symptoms. Patient aware if we dip urine and its clear and she does not have any symptoms insurance may or may not cover a culture (just beacuse patient requested). Patient said if u-dip is clear then DO NOT culture, Rene Kocher (lab Tech informed) Additional Follow-up by: Shonna Chock CMA,  May 18, 2010 1:58 PM     Laboratory Results   Urine Tests   Date/Time Reported: May 18, 2010 2:00 PM   Routine Urinalysis   Color: yellow Appearance: Clear Glucose: negative   (Normal Range: Negative) Bilirubin: negative   (Normal Range: Negative) Ketone: negative   (Normal Range: Negative) Spec. Gravity: <1.005   (Normal Range: 1.003-1.035) Blood: negative   (Normal Range: Negative) pH: 5.0   (Normal Range: 5.0-8.0) Protein: negative   (Normal Range: Negative) Urobilinogen: negative   (Normal Range: 0-1) Nitrite: negative   (Normal Range: Negative) Leukocyte Esterace: negative   (Normal Range: Negative)    Comments: Floydene Flock  May 18, 2010 2:00 PM

## 2010-09-14 NOTE — Progress Notes (Signed)
Summary: Refill Request  Phone Note Call from Patient Call back at Home Phone [phone]   Caller: Patient Summary of Call: Patient called back in ref: Actos, patient said she forgot that she was to take a 1/2 pill and informed the pharmacy wrong that she was still taking a whole pill.  Patient requested rx to be sent to [facility] and indiated that she was driving and would call next week to schedule a yearly follow-up appointment./Chrae [facility] CMA  April 07, 2010 4:56 PM     Prescriptions: ACTOS 45 MG  TABS (PIOGLITAZONE HCL) 1/2 once daily Brand medically necessary #45 x 0   Entered by:   Shonna Chock CMA   Authorized by:   Marga Melnick MD   Signed by:   Shonna Chock CMA on 04/07/2010   Method used:   Electronically to        [facility]* (retail)       [address]       [place], [place]  [id number]       Ph: [phone]       Fax: [phone]   RxID:   [id number]

## 2010-09-14 NOTE — Progress Notes (Signed)
Summary: refill metformin - dr hopper  Phone Note Refill Request   Refills Requested: Medication #1:  METFORMIN HCL 1000 MG TABS Take 1 tablet by mouth twice a day received fax from [facility] - fax [phone] - phone [phone]  Initial call taken by: Okey Regal Spring,  March 29, 2008 8:35 AM      Prescriptions: METFORMIN HCL 1000 MG TABS (METFORMIN HCL) Take 1 tablet by mouth twice a day  #60 Each x 5   Entered by:   Kandice Hams   Authorized by:   Marga Melnick MD   Signed by:   Kandice Hams on 03/29/2008   Method used:   Faxed to ...       [facility]*       [address]       [place], [place]  [address]       Ph: [phone]       Fax: [phone]   RxID:   [id number]

## 2010-09-14 NOTE — Progress Notes (Signed)
Summary: med strength  Phone Note Refill Request Message from:  Pharmacy on [facility]  Refills Requested: Medication #1:  AZMACORT 75 MCG/ACT  AERS 2 puffs 2-4 times daily THIS DRUG HAS BEEN DISCONTINUED-WHAT DOES MD WANT TO USE?   Initial call taken by: Barb Merino,  January 18, 2009 8:46 AM  Follow-up for Phone Call        med no longer available please advise on new med.Marland KitchenMarland KitchenFelecia Deloach CMA  January 18, 2009 12:23 PM  Additional Follow-up for Phone Call Additional follow up Details #1::        Q VAR 1-2 puffs two times a day #1,RX 11 Additional Follow-up by: Marga Melnick MD,  January 18, 2009 6:16 PM    Additional Follow-up for Phone Call Additional follow up Details #2::    which QVAR STRENGTH DO YOU WANT 40 or the 80....Marland KitchenMarland KitchenFelecia Deloach CMA  January 19, 2009 12:32 PM  Additional Follow-up for Phone Call Additional follow up Details #3:: Details for Additional Follow-up Action Taken: 80 please, RX 1 year Additional Follow-up by: Marga Melnick MD,  January 19, 2009 3:13 PM  New/Updated Medications: QVAR 80 MCG/ACT AERS (BECLOMETHASONE DIPROPIONATE) 1-2 puffs two times a day   Prescriptions: QVAR 80 MCG/ACT AERS (BECLOMETHASONE DIPROPIONATE) 1-2 puffs two times a day  #1 x 11   Entered by:   Jeremy Johann CMA   Authorized by:   Marga Melnick MD   Signed by:   Jeremy Johann CMA on 01/19/2009   Method used:   Faxed to ...       [facility]* (retail)       [address]       [place], [place]  [address]       Ph: [phone]       Fax: [phone]   RxID:   [id number]

## 2010-09-14 NOTE — Assessment & Plan Note (Signed)
Summary: bronchitis//tl   Vital Signs:  Patient Profile:   60 Years Old Female Weight:      211.50 pounds O2 Sat:      96 % Temp:     97.2 degrees F oral Pulse rate:   64 / minute Pulse rhythm:   regular Resp:     17 per minute BP sitting:   138 / 60  (left arm) Cuff size:   large  Pt. in pain?   no  Vitals Entered By: Wendall Stade (June 27, 2007 3:17 PM) Oxygen therapy Room Air                  Chief Complaint:  sick for one week and Cough.  History of Present Illness:  She has had coughing with brown mucus , using all inhalers , was in smokey bar environment, chest discomfort left side , wheezing, denies fever peak flow 200,220  Cough      This is a 60 year old woman who presents with Cough.  Trigger = smokey bar.  The patient reports productive cough, pleuritic chest pain, shortness of breath, wheezing, exertional dyspnea, and malaise, but denies non-productive cough, fever, and hemoptysis.  The patient denies the following symptoms: cold/URI symptoms, sore throat, nasal congestion, chronic rhinitis, weight loss, acid reflux symptoms, and peripheral edema.  The cough is worse with activity and passive smoke exposure.  Ineffective prior treatments have included OTC cough medication, albuterol inhaler, and other asthma medication.  Risk factors include history of asthma.  Symptoms better with Raki; energy work.   Current Allergies: ! * PLAVIX      Physical Exam  General:     Well-developed,well-nourished,in no acute distress; alert,appropriate and cooperative throughout examination Mouth:     Markedly hoarse Lungs:     Normal respiratory effort, chest expands symmetrically. Lungs are clear to auscultation, no crackles or wheezes. Surprizingly clear Cervical Nodes:     No lymphadenopathy noted Axillary Nodes:     No palpable lymphadenopathy    Impression & Recommendations:  Problem # 1:  BRONCHITIS-ACUTE (ICD-466.0)  Her updated medication list for  this problem includes:    Advair Diskus 250-50 Mcg/dose Misc (Fluticasone-salmeterol) ..... Use one puff twice daily, rinse, gargle and spit after use.    Azmacort 75 Mcg/act Aers (Triamcinolone acetonide) .[name]... 2 puffs 2-4 times daily    Albuterol 90 Mcg/act Aers (Albuterol) .[name]... Prn    Augmentin 875-125 Mg Tabs (Amoxicillin-pot clavulanate) .[name]... 1 q 12 hrs with food   Complete Medication List: 1)  Metformin Hcl 1000 Mg Tabs (Metformin hcl) .... Take 1 tablet by mouth twice a day 2)  Accupril 40 Mg Tabs (Quinapril hcl) .[name].. 1 by mouth qd 3)  Advair Diskus 250-50 Mcg/dose Misc (Fluticasone-salmeterol) .... Use one puff twice daily, rinse, gargle and spit after use. 4)  Atenolol 50 Mg Tabs (Atenolol) .[name].. 1 by mouth qd 5)  Paxil Cr 25 Mg Tb24 (Paroxetine hcl) .[name].. 1 by mouth qd 6)  Estradiol 0.5 Mg Tabs (Estradiol) .[name].. 1 by mouth qd 7)  Amaryl 4 Mg Tabs (Glimepiride) .... 1/2 tab qd 8)  Timoptic-xe 0.25 % Solg (Timolol maleate) 9)  Celebrex 200 Mg Caps (Celecoxib) .[name].. 1 by mouth bid 10)  Lipitor 10 Mg Tabs (Atorvastatin calcium) .[name].. 1 by mouth qd 11)  Actos 45 Mg Tabs (Pioglitazone hcl) .[name].. 1 by mouth qd 12)  Azmacort 75 Mcg/act Aers (Triamcinolone acetonide) .... 2 puffs 2-4 times daily 13)  Albuterol 90 Mcg/act Aers (  Albuterol) .... Prn 14)  Lorazepam 0.5 Mg Tabs (Lorazepam) .... 1/2 tab once daily prn 15)  Amlodipine Besylate 10 Mg Tabs (Amlodipine besylate) .[name].. 1 by mouth qd 16)  Augmentin 875-125 Mg Tabs (Amoxicillin-pot clavulanate) .[name].. 1 q 12 hrs with food   Patient Instructions: 1)  Drink as much fluid as you can tolerate for the next few days. Mucinex once daily as needed thick secretions.    Prescriptions: AUGMENTIN 875-125 MG  TABS (AMOXICILLIN-POT CLAVULANATE) 1 q 12 hrs with food  #20 x 0   Entered and Authorized by:   Marga Melnick MD   Signed by:   Marga Melnick MD on 06/27/2007   Method used:   Print then Give to Patient   RxID:    [id number] AZMACORT 75 MCG/ACT  AERS (TRIAMCINOLONE ACETONIDE) 2 puffs 2-4 times daily  #1 x 5   Entered and Authorized by:   Marga Melnick MD   Signed by:   Marga Melnick MD on 06/27/2007   Method used:   Print then Give to Patient   RxID:   [id number] ADVAIR DISKUS 250-50 MCG/DOSE  MISC (FLUTICASONE-SALMETEROL) Use one puff twice daily, rinse, gargle and spit after use.  #1 x 5   Entered and Authorized by:   Marga Melnick MD   Signed by:   Marga Melnick MD on 06/27/2007   Method used:   Print then Give to Patient   RxID:   [id number]  ][Asthma Q&E-A-P-CCC]

## 2010-09-14 NOTE — Progress Notes (Signed)
Summary: refill  Phone Note Refill Request   Refills Requested: Medication #1:  AMARYL 4 MG  TABS 1/2 tab qd [facility]  Initial call taken by: Charolette Child,  October 29, 2007 10:03 AM      Prescriptions: AMARYL 4 MG  TABS (GLIMEPIRIDE) 1/2 tab qd  #30 x 1   Entered by:   Ardyth Man   Authorized by:   Marga Melnick MD   Signed by:   Ardyth Man on 10/29/2007   Method used:   Electronically sent to ...       [facility]*       [address]       [place], [place]  [address]       Ph: [phone] or [phone]       Fax: [phone]   RxID:   [id number]

## 2010-09-14 NOTE — Letter (Signed)
Summary: [facility]  [facility]   Imported By: Lanelle Bal 06/30/2009 08:08:20  _____________________________________________________________________  External Attachment:    Type:   Image     Comment:   External Document

## 2010-09-14 NOTE — Assessment & Plan Note (Signed)
Summary: BLD SUGARS TOO HIGH/RH.......[name]   Vital Signs:  Patient profile:   60 year old female Height:      62 inches (157.48 cm) Weight:      219.13 pounds (99.60 kg) BMI:     40.22 Temp:     97.7 degrees F (36.50 degrees C) oral Resp:     15 per minute BP sitting:   140 / 60  (left arm)  Vitals Entered By: Lucious Groves CMA (September 05, 2010 12:39 PM) CC: CBG reading too high./kb, Type 2 diabetes mellitus follow-up, Syncope Is Patient Diabetic? Yes Pain Assessment Patient in pain? no      Comments Patient notes that the highest was 233 and the lowest 148. She has had HA and dizziness, but denies making any changes in diet.   CC:  CBG reading too high./kb, Type 2 diabetes mellitus follow-up, and Syncope.  History of Present Illness:      This is a 60 year old woman who presents for Type 2 diabetes mellitus  which she feels is uncontrolled based on sugars since 01/21.  The patient reports polyuria, blurred vision (from cataracts), and weight loss of 10#, but denies polydipsia, self managed hypoglycemia, and numbness of extremities.  The patient denies the following symptoms: neuropathic pain, chest pain, vomiting, orthostatic symptoms, poor wound healing, vision loss, and foot ulcer.  Since the last visit the patient reports good dietary compliance and not exercising regularly.  The patient has been measuring capillary blood glucose before breakfast ( 169-172) and  2 hrs after meals  (< 220). A1c was 7.1 % on 01/21 @ [facility].      The patient also presents with  dizziness intermittently for 2 weeks.  The patient reports occasional  palpitations, but denies near loss of consciousness.  Associated symptoms include headache circumferentially, "like  a crown" when glucoses were  in the  200s.  The patient denies the following symptoms: nausea, vomiting, pallor, diaphoresis, and focal weakness.  The patient reports  no  precipitating factors.  Current Medications (verified): 1)  Atenolol 50 Mg   Tabs (Atenolol) .[name].. 1 By Mouth Qd 2)  Paxil Cr 25 Mg  Tb24 (Paroxetine Hcl) .[name].. 1 By Mouth Qd 3)  Estradiol 0.5 Mg  Tabs (Estradiol) .[name].. 1 By Mouth Qd 4)  Lumigan 0.03 % Soln (Bimatoprost) .[name].. 1 X Daily 5)  Celebrex 200 Mg  Caps (Celecoxib) .[name].. 1 By Mouth Bid 6)  Lorazepam 0.5 Mg  Tabs (Lorazepam) .[name].. 1 Tab Once Daily Prn 7)  Amlodipine Besylate 10 Mg  Tabs (Amlodipine Besylate) .[name].. 1 By Mouth Qd 8)  Crestor 10 Mg Tabs (Rosuvastatin Calcium) .[name].. 1 By Mouth At Bedtime 9)  Bactroban 2 %  Oint (Mupirocin) .... Use As Directed Daily 10)  Multivitamins   Tabs (Multiple Vitamin) .[name].. 1 By Mouth Once Daily 11)  Aspirin 325 Mg Tabs (Aspirin) .[name].. 1 By Mouth Once Daily 12)  Vitamin C 1000 Mg  Tabs (Ascorbic Acid) .[name].. 1 By Mouth Once Daily 13)  Melatonin 5 Mg  Tabs (Melatonin) .[name].. 1 By Mouth At Bedtime 14)  Mega Red .[name].. 1 By Mouth Once Daily 15)  Mucinex 600 Mg  Tb12 (Guaifenesin) .[name].. 1 By Mouth Two Times A Day 16)  Herb Lax .Marland KitchenMarland Kitchen. 1 By Mouth Once Daily 17)  Spiru-Tein in 1% Milk .[name].. 7 Days/week 18)  Freestyle Freedom Lite W/device  Kit (Blood Glucose Monitoring Suppl) .... Use As Directed 19)  Freestyle Lite Test   Strp (  Glucose Blood) .... Use As Directed 20)  Freestyle Lancets   Misc (Lancets) .... Use As Directed To Montior Blood Sugars 21)  Effient 10 Mg Tabs (Prasugrel Hcl) .[name].. 1 By Mouth Once Daily 22)  Symbicort 160-4.5 Mcg/act Aero (Budesonide-Formoterol Fumarate) .[name].. 1-2 Puffs Q 12 Hrs ; Gargle After Use 23)  Tramadol Hcl 50 Mg Tabs (Tramadol Hcl) .[name].. 1 Every 6 Hrs For Pain ; Do Not Take Within 4 Hours of Paxil 24)  Proventil Hfa 108 (90 Base) Mcg/act Aers (Albuterol Sulfate) .[name].. 1-2 Puffs Every 4-6 Hours As Needed 25)  Januvia 50 Mg Tabs (Sitagliptin Phosphate) .... 1/2 Am , 1 Eve & 1/2 With Snack 26)  Amoxicillin 500 Mg Tabs (Amoxicillin) .[name].. 1 By Mouth Tid  Allergies (verified): 1)  ! * Plavix  Physical Exam  General:  in no acute distress; alert,appropriate and  cooperative throughout examination Eyes:  No corneal or conjunctival inflammation noted. EOMI. Perrla. Field of  Vision grossly normal. Mouth:  Oral mucosa and oropharynx without lesions or exudates.  Tongue not deviated. Lungs:  Normal respiratory effort, chest expands symmetrically. Lungs are clear to auscultation, no crackles or wheezes. Heart:  normal rate, regular rhythm, no gallop, no rub, no JVD, and grade 1.5 - 2  /6 systolic murmur with carotid radiation.   Pulses:  R and L carotid,radial,dorsalis pedis and posterior tibial pulses are full and equal bilaterally Extremities:  No clubbing, cyanosis, edema. Good nail health Neurologic:  alert & oriented X3, cranial nerves II-XII intact, strength normal in all extremities, sensation intact to light touch, and DTRs symmetrical and normal.   Skin:  Intact without suspicious lesions or rashes Cervical Nodes:  No lymphadenopathy noted Axillary Nodes:  No palpable lymphadenopathy Psych:  memory intact for recent and remote, normally interactive, and good eye contact.     Impression & Recommendations:  Problem # 1:  DIABETES MELLITUS, TYPE II (ICD-250.00) R/O Somojii effect The following medications were removed from the medication list:    Metformin Hcl 1000 Mg Tabs (Metformin hcl) .[name]... 1/2 am , 1 eve & 1/2 with snack at bedtime    Januvia 50 Mg Tabs (Sitagliptin phosphate) .[name]... 1/2 am , 1 eve & 1/2 with snack Her updated medication list for this problem includes:    Aspirin 325 Mg Tabs (Aspirin) .[name]... 1 by mouth once daily    Janumet 50-1000 Mg Tabs (Sitagliptin-metformin hcl) .[name]... 1/2 am , 1  with  dinner    Glimepiride 1 Mg Tabs (Glimepiride) .[name]... 1 with b'fast  Problem # 2:  HEADACHE (ICD-784.0) only with glucoses > 200 Her updated medication list for this problem includes:    Atenolol 50 Mg Tabs (Atenolol) .[name]... 1 by mouth qd    Celebrex 200 Mg Caps (Celecoxib) .[name]... 1 by mouth bid    Aspirin 325 Mg Tabs (Aspirin) .[name]... 1 by  mouth once daily    Tramadol Hcl 50 Mg Tabs (Tramadol hcl) .[name]... 1 every 6 hrs for pain ; do not take within 4 hours of paxil  Problem # 3:  DIZZINESS (ICD-780.4) ? related to marked increases in glucoses  Complete Medication List: 1)  Atenolol 50 Mg Tabs (Atenolol) .[name].. 1 by mouth qd 2)  Paxil Cr 25 Mg Tb24 (Paroxetine hcl) .[name].. 1 by mouth qd 3)  Estradiol 0.5 Mg Tabs (Estradiol) .[name].. 1 by mouth qd 4)  Lumigan 0.03 % Soln (Bimatoprost) .[name].. 1 x daily 5)  Celebrex 200 Mg Caps (Celecoxib) .[name].. 1 by mouth bid 6)  Lorazepam 0.5 Mg Tabs (Lorazepam) .[name].. 1 tab once daily prn 7)  Amlodipine Besylate 10 Mg Tabs (Amlodipine besylate) .[name].. 1 by mouth qd 8)  Crestor 10 Mg Tabs (Rosuvastatin calcium) .[name].. 1 by mouth at bedtime 9)  Bactroban 2 % Oint (Mupirocin) .... Use as directed daily 10)  Multivitamins Tabs (Multiple vitamin) .[name].. 1 by mouth once daily 11)  Aspirin 325 Mg Tabs (Aspirin) .[name].. 1 by mouth once daily 12)  Vitamin C 1000 Mg Tabs (Ascorbic acid) .[name].. 1 by mouth once daily 13)  Melatonin 5 Mg Tabs (Melatonin) .[name].. 1 by mouth at bedtime 14)  Mega Red  .[name].. 1 by mouth once daily 15)  Mucinex 600 Mg Tb12 (Guaifenesin) .[name].. 1 by mouth two times a day 16)  Herb Lax  .Marland KitchenMarland Kitchen. 1 by mouth once daily 17)  Spiru-tein in 1% Milk  .[name].. 7 days/week 18)  Freestyle Freedom Lite W/device Kit (Blood glucose monitoring suppl) .... Use as directed 19)  Freestyle Lite Test Strp (Glucose blood) .... Use as directed 20)  Freestyle Lancets Misc (Lancets) .... Use as directed to montior blood sugars 21)  Effient 10 Mg Tabs (Prasugrel hcl) .[name].. 1 by mouth once daily 22)  Symbicort 160-4.5 Mcg/act Aero (Budesonide-formoterol fumarate) .[name].. 1-2 puffs q 12 hrs ; gargle after use 23)  Tramadol Hcl 50 Mg Tabs (Tramadol hcl) .[name].. 1 every 6 hrs for pain ; do not take within 4 hours of paxil 24)  Proventil Hfa 108 (90 Base) Mcg/act Aers (Albuterol sulfate) .[name].. 1-2 puffs every 4-6 hours as needed 25)  Amoxicillin 500  Mg Tabs (Amoxicillin) .[name].. 1 by mouth tid 26)  Janumet 50-1000 Mg Tabs (Sitagliptin-metformin hcl) .... 1/2 am , 1  with  dinner 27)  Glimepiride 1 Mg Tabs (Glimepiride) .Marland KitchenMarland Kitchen. 1 with b'fast  Patient Instructions: 1)  HbgA1C & Urine Microalbumin  in 8 weeks(250.00) 2)  Check your blood sugars regularly. If your readings are usually above : 150 or below 90 you should contact our office. Prescriptions: GLIMEPIRIDE 1 MG TABS (GLIMEPIRIDE) 1 with b'fast  #30 x 2   Entered and Authorized by:   Marga Melnick MD   Signed by:   Marga Melnick MD on 09/05/2010   Method used:   Electronically to        [facility]* (retail)       [address]       [place], [place]  [address]       Ph: [phone]       Fax: [phone]   RxID:   [id number] FREESTYLE LITE TEST   STRP (GLUCOSE BLOOD) USE AS DIRECTED  #100 x 6   Entered by:   Lucious Groves CMA   Authorized by:   Marga Melnick MD   Signed by:   Marga Melnick MD on 09/05/2010   Method used:   Electronically to        [facility]* (retail)       [address]       [place], [place]  [id number]       Ph: [phone]       Fax: [phone]   RxID:   [id number]    Orders Added: 1)  Est. Patient Level IV [30160]

## 2010-09-14 NOTE — Progress Notes (Signed)
Summary: Janumet/Metformin  Phone Note Call from Patient Call back at [phone]   Summary of Call: Patient left message on triage that she lost prescription for Janumet patient would like new prescription sent to [facility]. Initial call taken by: Lucious Groves CMA,  July 03, 2010 9:56 AM  Follow-up for Phone Call        Patient notified prescription was sent to the her pharmacy. Patient would like to know if it is still ok for her to take 500mg  of Metformin qid? She notes that when she takes it this way she does not have HA, nausea, or abd pain anymore. Please advise. Follow-up by: Lucious Groves CMA,  July 03, 2010 10:04 AM  Additional Follow-up for Phone Call Additional follow up Details #1::        Janumet will replace Metformin (it's in it) .  Additional Follow-up by: Marga Melnick MD,  July 03, 2010 11:53 AM    Additional Follow-up for Phone Call Additional follow up Details #2::    Patient was wondering if she could take the Janumet qid as she previously did the Metformin b/c it makes her sick. Lucious Groves CMA  July 03, 2010 11:59 AM   Per MD the patient must take it as prescribed, and she must come in for office visit if she does not wish to do so. Patient advised and states that she will call back for appt when she has her calendar. Lucious Groves CMA  July 03, 2010 12:01 PM   Prescriptions: JANUMET 50-1000 MG TABS (SITAGLIPTIN-METFORMIN HCL) 1 two times a day with 2 largest meals  #60 x 3   Entered by:   Lucious Groves CMA   Authorized by:   Marga Melnick MD   Signed by:   Lucious Groves CMA on 07/03/2010   Method used:   Faxed to ...       [facility]* (retail)       [address]       [place], [place]  [id number]       Ph: [phone]       Fax: [phone]   RxID:   [id number]

## 2010-09-14 NOTE — Assessment & Plan Note (Signed)
Summary: DISCUSS TESTS FROM URG MED; SOB///SPH   Vital Signs:  Patient Profile:   60 Years Old Female Weight:      216.25 pounds Pulse rate:   60 / minute Pulse rhythm:   regular Resp:     17 per minute BP sitting:   120 / 62  (left arm) Cuff size:   large  Pt. in pain?   no  Vitals Entered By: Wendall Stade (August 15, 2007 4:41 PM)                  Chief Complaint:  to go over labs from urgent care.  History of Present Illness: She was seen @ [facility] because of DOE & bilat cp & wheezing.Labs WNL except HCT 31.6 with low  normal ferritin @ 29.Iron Rxed. B12 & folate WNL. Dr Matthias Hughs did flex sigmoid 2003; she received call back in 11/08 for  colonoscopy.Stool cards not completed yet. Sister had colon polyps.   Current Allergies (reviewed today): ! * PLAVIX     Review of Systems  General      Denies chills, fever, sweats, and weight loss.  ENT      Denies nosebleeds.  Resp      Denies coughing up blood.  GI      Denies abdominal pain, bloody stools, dark tarry stools, and indigestion.      On Prilosec qd  GU      Denies hematuria.  Neuro      On Celebrex two times a day for spinal stenosis from Dr Charlett Blake  Heme      Denies abnormal bruising and bleeding.   Physical Exam  General:     Well-developed,well-nourished,in no acute distress; alert,appropriate and cooperative throughout examination Lungs:     Normal respiratory effort, chest expands symmetrically. Lungs are clear to auscultation, no crackles or wheezes. Heart:     Normal rate and regular rhythm. S1 and S2 normal without gallop, murmur, click, rub. S4 Abdomen:     Bowel sounds positive,abdomen soft and non-tender without masses, organomegaly or hernias noted. Cervical Nodes:     No lymphadenopathy noted Axillary Nodes:     No palpable lymphadenopathy    Impression & Recommendations:  Problem # 1:  UNSPECIFIED ANEMIA (ICD-285.9)  Complete Medication List: 1)  Metformin Hcl 1000 Mg  Tabs (Metformin hcl) .... Take 1 tablet by mouth twice a day 2)  Accupril 40 Mg Tabs (Quinapril hcl) .[name].. 1 by mouth qd 3)  Advair Diskus 250-50 Mcg/dose Misc (Fluticasone-salmeterol) .... Use one puff twice daily, rinse, gargle and spit after use. 4)  Atenolol 50 Mg Tabs (Atenolol) .[name].. 1 by mouth qd 5)  Paxil Cr 25 Mg Tb24 (Paroxetine hcl) .[name].. 1 by mouth qd 6)  Estradiol 0.5 Mg Tabs (Estradiol) .[name].. 1 by mouth qd 7)  Amaryl 4 Mg Tabs (Glimepiride) .... 1/2 tab qd 8)  Timoptic-xe 0.25 % Solg (Timolol maleate) 9)  Celebrex 200 Mg Caps (Celecoxib) .[name].. 1 by mouth bid 10)  Actos 45 Mg Tabs (Pioglitazone hcl) .[name].. 1 by mouth qd 11)  Azmacort 75 Mcg/act Aers (Triamcinolone acetonide) .... 2 puffs 2-4 times daily 12)  Albuterol 90 Mcg/act Aers (Albuterol) .... Prn 13)  Lorazepam 0.5 Mg Tabs (Lorazepam) .... 1/2 tab once daily prn 14)  Amlodipine Besylate 10 Mg Tabs (Amlodipine besylate) .[name].. 1 by mouth qd 15)  Zetia 10 Mg Tabs (Ezetimibe) .[name].. 1 by mouth qd   Patient Instructions: 1)  Complete stool cards. Please call  Dr Matthias Hughs  for consult.Minimize Celebrex use due to GI risks.    ]

## 2010-09-14 NOTE — Progress Notes (Signed)
Summary: refill  Phone Note Refill Request Message from:  Fax from Pharmacy on May 01, 2010 8:18 AM  Refills Requested: Medication #1:  ATENOLOL 50 MG  TABS 1 by mouth qd [facility] - fax [phone]  Initial call taken by: Okey Regal [place],  May 01, 2010 8:19 AM    Prescriptions: ATENOLOL 50 MG  TABS (ATENOLOL) 1 by mouth qd  #30 Each x 5   Entered by:   Shonna Chock CMA   Authorized by:   Marga Melnick MD   Signed by:   Shonna Chock CMA on 05/01/2010   Method used:   Electronically to        [facility]* (retail)       [address]       [place], [place]  [id number]       Ph: [phone]       Fax: [phone]   RxID:   [id number]

## 2010-09-14 NOTE — Assessment & Plan Note (Signed)
Summary: MED REFILL /LAST REFILL NEED AN OFFICE VISIT.KDC   Vital Signs:  Patient profile:   60 year old female Height:      62.75 inches Weight:      219.6 pounds BMI:     39.35 Temp:     98.6 degrees F oral Pulse rate:   72 / minute Resp:     16 per minute BP sitting:   122 / 78  (left arm) Cuff size:   large  Vitals Entered By: Shonna Chock (January 17, 2009 3:57 PM) CC: YEARLY CHECK-UP : EKG DONE 12/10/2008, @ THE BEGINNING OF THIS MONTH STARTED WITH CARDIAC REHAB PHASE 1 Comments REFILL MEDS-METFORMIN,ACCUPRIL,ATENOLOL,AMARYL,ACTOS,AZMACORT,ALBUTEROL,AMLODIPINE   CC:  YEARLY CHECK-UP : EKG DONE 12/10/2008 and @ THE BEGINNING OF THIS MONTH STARTED WITH CARDIAC REHAB PHASE 1.  History of Present Illness: FBS not checked; 2 hr pc glucose was 183 last week. No hypoglycemia; weight up 15#. BP well controlled @ Cardiac Rehab.  Preventive Screening-Counseling & Management  Alcohol-Tobacco     Smoking Status: quit  Caffeine-Diet-Exercise     Does Patient Exercise: yes  Allergies: 1)  ! * Plavix  Past History:  Past Medical History: Diabetes mellitus, type II Hyperlipidemia PSO; Multinodular goiter; Panic Disorder, Dr Raquel James Asthma  Past Surgical History: Hysterectomy & BSO for  PSO CATH-2002,2006; Colonoscopy & Upper Endo neg 12/09 by Dr Matthias Hughs, due 2019  Family History: Family History of Alcoholism/Addiction-AUNTS/UNCLES ON BOTH SIDE'S OF FAMILY Family Hsitory Headaches-MOTHER Father: HTN,MI @ 61 Mother: DUD, depression, recurrent Pulmonary infections Siblings: bro HTN; bro pancreatic CA  Social History: No diet Former Smoker: quit 1995 Alcohol use-no Regular exercise-yes(Cardiac Rehab) Does Patient Exercise:  yes  Review of Systems General:  Denies fatigue and sleep disorder. Eyes:  Denies blurring, double vision, and vision loss-both eyes; Last exam 12/09 ; no DM changes. Cataracts bilaterally. ENT:  Complains of difficulty swallowing; denies  hoarseness; Neg Endo 12/09. CV:  Denies chest pain or discomfort, difficulty breathing at night, difficulty breathing while lying down, lightheadness, and near fainting. Resp:  Complains of shortness of breath and wheezing; denies excessive snoring, hypersomnolence, and morning headaches; Rescue MDI not used, on Advair with good control. GI:  Denies indigestion; On PPI with control. Derm:  Denies changes in color of skin, changes in nail beds, dryness, hair loss, and poor wound healing. Neuro:  Denies numbness and tingling; No burning in feet. Psych:  Complains of anxiety and easily tearful; denies depression, easily angered, and irritability; Teenager issues. Endo:  Denies cold intolerance, excessive hunger, excessive thirst, excessive urination, and heat intolerance. Heme:  Denies abnormal bruising and bleeding.  Physical Exam  General:  in no acute distress; alert,appropriate and cooperative throughout examination Neck:  No deformities, masses, or tenderness noted. Lungs:  Normal respiratory effort, chest expands symmetrically. Lungs are clear to auscultation, no crackles or wheezes. Heart:  normal rate, regular rhythm, no gallop, no rub, no JVD, no HJR, and grade 1 /6 systolic murmur.   Abdomen:  Bowel sounds positive,abdomen soft and non-tender without masses, organomegaly or hernias noted. Pulses:  R and L carotid,radial,dorsalis pedis and posterior tibial pulses are full and equal bilaterally Extremities:  No clubbing, cyanosis, edema. Absent big toenails Neurologic:  alert & oriented X3 and sensation intact to light touch over feet   Skin:  Intact without suspicious lesions or rashes Cervical Nodes:  No lymphadenopathy noted Axillary Nodes:  No palpable lymphadenopathy Psych:  memory intact for recent and remote, normally interactive, good eye  contact, not anxious appearing, and not depressed appearing.     Impression & Recommendations:  Problem # 1:  DIABETES MELLITUS, TYPE II  (ICD-250.00)  Her updated medication list for this problem includes:    Metformin Hcl 1000 Mg Tabs (Metformin hcl) .[name]... Take 1 tablet by mouth twice a day    Accupril 40 Mg Tabs (Quinapril hcl) .[name]... 1 by mouth qd    Amaryl 2 Mg Tabs (Glimepiride) .[name]... Take 1/2 tablet daily- office visit and labs due    Actos 45 Mg Tabs (Pioglitazone hcl) .[name]... 1 by mouth qd    Aspirin 325 Mg Tabs (Aspirin) .[name]... 1 by mouth once daily  Problem # 2:  HYPERLIPIDEMIA (ICD-272.4)  Her updated medication list for this problem includes:    Crestor 10 Mg Tabs (Rosuvastatin calcium) .[name]... 1 by mouth at bedtime  Problem # 3:  ASTHMA (ICD-493.90)  Her updated medication list for this problem includes:    Advair Diskus 250-50 Mcg/dose Misc (Fluticasone-salmeterol) ..... Use one puff twice daily, rinse, gargle and spit after use.    Azmacort 75 Mcg/act Aers (Triamcinolone acetonide) .[name]... 2 puffs 2-4 times daily    Albuterol 90 Mcg/act Aers (Albuterol) .[name]... Prn  Problem # 4:  GOITER (ICD-240.9) PMH of  Problem # 5:  UNSPECIFIED ANEMIA (ICD-285.9) PMH of  Complete Medication List: 1)  Metformin Hcl 1000 Mg Tabs (Metformin hcl) .... Take 1 tablet by mouth twice a day 2)  Accupril 40 Mg Tabs (Quinapril hcl) .[name].. 1 by mouth qd 3)  Advair Diskus 250-50 Mcg/dose Misc (Fluticasone-salmeterol) .... Use one puff twice daily, rinse, gargle and spit after use. 4)  Atenolol 50 Mg Tabs (Atenolol) .[name].. 1 by mouth qd 5)  Paxil Cr 25 Mg Tb24 (Paroxetine hcl) .[name].. 1 by mouth qd 6)  Estradiol 0.5 Mg Tabs (Estradiol) .[name].. 1 by mouth qd 7)  Amaryl 2 Mg Tabs (Glimepiride) .... Take 1/2 tablet daily- office visit and labs due 8)  Timoptic-xe 0.25 % Solg (Timolol maleate) 9)  Celebrex 200 Mg Caps (Celecoxib) .[name].. 1 by mouth bid 10)  Actos 45 Mg Tabs (Pioglitazone hcl) .[name].. 1 by mouth qd 11)  Azmacort 75 Mcg/act Aers (Triamcinolone acetonide) .... 2 puffs 2-4 times daily 12)  Albuterol 90 Mcg/act Aers (Albuterol) .... Prn 13)   Lorazepam 0.5 Mg Tabs (Lorazepam) .[name].. 1 tab once daily prn 14)  Amlodipine Besylate 10 Mg Tabs (Amlodipine besylate) .[name].. 1 by mouth qd 15)  Crestor 10 Mg Tabs (Rosuvastatin calcium) .[name].. 1 by mouth at bedtime 16)  Bactroban 2 % Oint (Mupirocin) .... Use as directed daily 17)  Multivitamins Tabs (Multiple vitamin) .[name].. 1 by mouth once daily 18)  Aspirin 325 Mg Tabs (Aspirin) .[name].. 1 by mouth once daily 19)  Vitamin C 1000 Mg Tabs (Ascorbic acid) .[name].. 1 by mouth once daily 20)  Darvon-n 100 Mg Tabs (Propoxyphene napsylate) .[name].. 1-2 by mouth once daily as needed 21)  Tylenol Arthritis Pain 650 Mg Tbcr (Acetaminophen) .[name].. 1 by mouth in the am, 2 by mouth in the pm 22)  Melatonin 5 Mg Tabs (Melatonin) .[name].. 1 by mouth at bedtime 23)  Fish Oil 1000 Mg Caps (Omega-3 fatty acids) .[name].. 1 by mouth once daily 24)  Mucinex 600 Mg Tb12 (Guaifenesin) .... 2 by mouth as needed 25)  Herb Lax  .Marland KitchenMarland Kitchen. 1 by mouth once daily 26)  Spiru-tein in 1% Milk  .[name].. 7 days/week 27)  Freestyle Freedom Lite W/device Kit (Blood glucose monitoring suppl) .... Use  as directed 28)  Freestyle Lite Test Strp (Glucose blood) .... Use as directed 29)  Freestyle Lancets Misc (Lancets) .... Use as directed to montior blood sugars 30)  Effient 10 Mg Tabs (Prasugrel hcl) .[name].. 1 by mouth once daily  Patient Instructions: 1)  BMP prior to visit, ICD-9:401.9 2)  Hepatic Panel prior to visit, ICD-9:995.20 3)  Lipid Panel prior to visit, ICD-9:272.4 4)  TSH, free T4,free T3 prior to visit, ICD-9:240.9 5)  CBC w/ Diff prior to visit, ICD-9:285.9 6)  HbgA1C prior to visit, ICD-9:250.00 7)  Urine Microalbumin prior to visit, ICD-9:250.00 Prescriptions: AMARYL 2 MG TABS (GLIMEPIRIDE) take 1/2 tablet daily- OFFICE VISIT AND LABS DUE  #90 x 0   Entered and Authorized by:   Marga Melnick MD   Signed by:   Marga Melnick MD on 01/17/2009   Method used:   Print then Give to Patient   RxID:   [id number] AMLODIPINE BESYLATE 10 MG   TABS (AMLODIPINE BESYLATE) 1 by mouth qd  #90 x 1   Entered and Authorized by:   Marga Melnick MD   Signed by:   Marga Melnick MD on 01/17/2009   Method used:   Print then Give to Patient   RxID:   [id number] ALBUTEROL 90 MCG/ACT  AERS (ALBUTEROL) prn  #1 x 2   Entered and Authorized by:   Marga Melnick MD   Signed by:   Marga Melnick MD on 01/17/2009   Method used:   Print then Give to Patient   RxID:   [id number] AZMACORT 75 MCG/ACT  AERS (TRIAMCINOLONE ACETONIDE) 2 puffs 2-4 times daily  #1 x 11   Entered and Authorized by:   Marga Melnick MD   Signed by:   Marga Melnick MD on 01/17/2009   Method used:   Print then Give to Patient   RxID:   [id number] ACTOS 45 MG  TABS (PIOGLITAZONE HCL) 1 by mouth qd  #30 x 5   Entered and Authorized by:   Marga Melnick MD   Signed by:   Marga Melnick MD on 01/17/2009   Method used:   Print then Give to Patient   RxID:   [id number] ATENOLOL 50 MG  TABS (ATENOLOL) 1 by mouth qd  #90 x 1   Entered and Authorized by:   Marga Melnick MD   Signed by:   Marga Melnick MD on 01/17/2009   Method used:   Print then Give to Patient   RxID:   [id number] ACCUPRIL 40 MG TABS (QUINAPRIL HCL) 1 by mouth qd  #90 x 1   Entered and Authorized by:   Marga Melnick MD   Signed by:   Marga Melnick MD on 01/17/2009   Method used:   Print then Give to Patient   RxID:   [id number] METFORMIN HCL 1000 MG TABS (METFORMIN HCL) Take 1 tablet by mouth twice a day  #180 x 1   Entered and Authorized by:   Marga Melnick MD   Signed by:   Marga Melnick MD on 01/17/2009   Method used:   Print then Give to Patient   RxID:   [id number]

## 2010-09-14 NOTE — Progress Notes (Signed)
Summary: [facility]--rx  Phone Note Refill Request   Refills Requested: Medication #1:  ACTOS 45 MG  TABS 1 by mouth qd  Medication #2:  ADVAIR DISKUS 250-50 MCG/DOSE  MISC Use one puff twice daily [facility]--[phone]  Initial call taken by: Freddy Jaksch,  August 31, 2008 8:45 AM      Prescriptions: ACTOS 45 MG  TABS (PIOGLITAZONE HCL) 1 by mouth qd  #30 x 2   Entered by:   Kandice Hams   Authorized by:   Marga Melnick MD   Signed by:   Kandice Hams on 08/31/2008   Method used:   Faxed to ...       [facility]* (retail)       [address]       [place], [place]  [address]       Ph: [phone]       Fax: [phone]   RxID:   [id number] ADVAIR DISKUS 250-50 MCG/DOSE  MISC (FLUTICASONE-SALMETEROL) Use one puff twice daily, rinse, gargle and spit after use.  #1 x 3   Entered by:   Kandice Hams   Authorized by:   Marga Melnick MD   Signed by:   Kandice Hams on 08/31/2008   Method used:   Faxed to ...       [facility]* (retail)       [address]       [place], [place]  [address]       Ph: [phone]       Fax: [phone]   RxID:   [id number]

## 2010-09-14 NOTE — Progress Notes (Signed)
Summary: [facility]---RX  Phone Note Refill Request   Refills Requested: Medication #1:  ACTOS 45 MG  TABS 1 by mouth qd   Last Refilled: 10/25/2008 [facility]--PH-[phone] FAX--[phone]  Initial call taken by: Freddy Jaksch,  November 22, 2008 10:25 AM      Prescriptions: ACTOS 45 MG  TABS (PIOGLITAZONE HCL) 1 by mouth qd  #30 x 2   Entered by:   Jeremy Johann CMA   Authorized by:   Marga Melnick MD   Signed by:   Jeremy Johann CMA on 11/22/2008   Method used:   Faxed to ...       [facility]* (retail)       [address]       [place], [place]  [address]       Ph: [phone]       Fax: [phone]   RxID:   [id number]

## 2010-09-14 NOTE — Letter (Signed)
Summary: [facility]  [facility]   Imported By: Lanelle Bal 12/22/2008 14:15:18  _____________________________________________________________________  External Attachment:    Type:   Image     Comment:   External Document

## 2010-09-14 NOTE — Progress Notes (Signed)
Summary: asthma  Phone Note Call from Patient Call back at Work Phone [phone] Call back at [phone]   Caller: Patient Reason for Call: Acute Illness Summary of Call: dr. hopper pt is having problems with her asthma. she is leaving sat[place] for a curise for a week. she is not able to walk up steps. she has also having chest discomfort. i let the pt speak with the nurse . Initial call taken by: Charolette Child,  August 12, 2007 2:29 PM  Follow-up for Phone Call        s/w pt who c/o chest discomfort,says going on cruise sat am, have appt with [facility] on friday, recommend pt to [facility] for today for sx, pt says she goes to [facility] and like docs there, pt will go today and keep appt on fri with hop...................................................................Marland KitchenKandice Hams  August 12, 2007 2:44 PM  Follow-up by: Kandice Hams,  August 12, 2007 2:44 PM

## 2010-09-14 NOTE — Progress Notes (Signed)
Summary: APPT SCHEDULED....Marland KitchenLAB AND OFFICE VISIT  Phone Note Refill Request   Refills Requested: Medication #1:  Glimepride 4mg  #30 [facility] [address] --[phone]  Initial call taken by: Freddy Jaksch,  January 20, 2008 8:19 AM  Follow-up for Phone Call        Left message with Emma-patients daughter, to have patient return call when avaliable. Reason for call was to tell patient she is due for a fasting follow-up appointment with Dr.[name]  in the next 30 days./Chrae [facility]  January 20, 2008 9:42 AM   Additional Follow-up for Phone Call Additional follow up Details #1::        PT RETURNED CALL....SHE IS AWARE OF THE APPTS SHE NEED LAB AND F/U OFFICE VISIT...SHE WILL BE CALLING ME BACK.Marland KitchenMarland KitchenNEED LAB ORDER TO SCHEDULE LAB APPT.Marland KitchenMarland KitchenDaine Gip  January 20, 2008 1:52 PM Additional Follow-up by: Daine Gip,  January 20, 2008 1:52 PM    Additional Follow-up for Phone Call Additional follow up Details #2::    LIPID,MALB,A1C,ALT,AST ([phone])/[facility]  January 20, 2008 3:37 PM     Prescriptions: AMARYL 4 MG  TABS (GLIMEPIRIDE) 1/2 tab qd  #30 x 0   Entered by:   Shonna Chock   Authorized by:   Marga Melnick MD   Signed by:   Shonna Chock on 01/20/2008   Method used:   Electronically sent to ...       [facility]*       [address]       [place], [place]  [address]       Ph: [phone]       Fax: [phone]   RxID:   [id number]

## 2010-09-14 NOTE — Letter (Signed)
Summary: [facility]   Imported By: Freddy Jaksch 09/08/2007 10:51:01  _____________________________________________________________________  External Attachment:    Type:   Image     Comment:   External Document

## 2010-09-14 NOTE — Progress Notes (Signed)
  Phone Note Call from Patient Call back at [phone]   Summary of Call: Patient talked to opth.  She changed her Timoptic to something that she can use with albuterol inhaler.  Ok to call inhaler to [place]? Initial call taken by: Shary Decamp,  August 19, 2009 5:32 PM  Follow-up for Phone Call        albuteral 1-2 puffs q 4 hrs as needed #1, RX2 Follow-up by: Marga Melnick MD,  August 19, 2009 5:40 PM    New/Updated Medications: PROVENTIL HFA 108 (90 BASE) MCG/ACT AERS (ALBUTEROL SULFATE) 1-2 puffs every 4-6 hours as needed Prescriptions: PROVENTIL HFA 108 (90 BASE) MCG/ACT AERS (ALBUTEROL SULFATE) 1-2 puffs every 4-6 hours as needed  #1 x 2   Entered by:   Shary Decamp   Authorized by:   Marga Melnick MD   Signed by:   Shary Decamp on 08/19/2009   Method used:   Electronically to        [facility]* (retail)       [address]       [place], [place]  [id number]       Ph: [phone]       Fax: [phone]   RxID:   [id number]

## 2010-09-14 NOTE — Progress Notes (Signed)
Summary: LAB RESULTS  Phone Note Outgoing Call   Summary of Call: left message to call  office.................Marland KitchenFelecia Deloach CMA  May 10, 2009 10:20 AM   Mild anemia ; please complete stool cards & repeat CBC with B12, folate & iron panel.Followup with office visit once completed   Follow-up for Phone Call        PT CALLED AND LEFT MSG, called and got ans machine left msg to call to give recommendations .Kandice Hams  May 10, 2009 5:05 PM   Additional Follow-up for Phone Call Additional follow up Details #1::        D/W PATIENT, SCHEDULED APPOINTMENT TO RECHECK LABS. PATIENT WILL CALL BACK TO SET UP F/U WITH DR.[name] ONCE SHE CHECKS HER SCHEDULE  MAILED STOOL CARDS AND LABS  Additional Follow-up by: Shonna Chock,  May 11, 2009 3:28 PM

## 2010-09-14 NOTE — Progress Notes (Signed)
Summary: Refill Request  Phone Note Refill Request Call back at [phone] Message from:  Pharmacy on January 23, 2010 10:12 AM  Refills Requested: Medication #1:  ACCUPRIL 40 MG TABS 1 by mouth qd   Dosage confirmed as above?Dosage Confirmed   Supply Requested: 1 month   Last Refilled: 12/21/2009 [facility]  Next Appointment Scheduled: none Initial call taken by: Harold Barban,  January 23, 2010 10:15 AM    Prescriptions: ACCUPRIL 40 MG TABS (QUINAPRIL HCL) 1 by mouth qd  #30 Each x 4   Entered by:   Shonna Chock   Authorized by:   Marga Melnick MD   Signed by:   Shonna Chock on 01/23/2010   Method used:   Telephoned to ...       [facility]* (retail)       [address]       [place], [place]  [address]       Ph: [phone]       Fax: [phone]   RxID:   [id number]

## 2010-09-14 NOTE — Progress Notes (Signed)
Summary: Med advisement  Phone Note Call from Patient Call back at Home Phone [phone]   Summary of Call: Patient called noting that she was seen at Urgent Care on Christmas Eve and she was given a Nubulizer with prescription for ventolin/albuterol. Patient notes that she has not used it due to wanting [facility] approval. Patient also notes that now she is coughing up green mucous. Patient would like to know if she should do the breating treatment alone or does she need abx also. Please advise. Initial call taken by: Lucious Groves CMA,  August 08, 2010 3:31 PM  Follow-up for Phone Call        Per MD:  1. Use nebulizer q4h as needed 2. Take Amox. 500 three times a day #30 if not allergic  Patient notified and wants prescription sent to [facility]. Lucious Groves CMA  August 08, 2010 4:45 PM     New/Updated Medications: AMOXICILLIN 500 MG TABS (AMOXICILLIN) 1 by mouth tid Prescriptions: AMOXICILLIN 500 MG TABS (AMOXICILLIN) 1 by mouth tid  #30 x 0   Entered by:   Lucious Groves CMA   Authorized by:   Marga Melnick MD   Signed by:   Lucious Groves CMA on 08/08/2010   Method used:   Faxed to ...       [facility]* (retail)       [address]       [place], [place]  [address]       Ph: [phone]       Fax: [phone]   RxID:   [id number]

## 2010-11-24 ENCOUNTER — Other Ambulatory Visit: Payer: Self-pay

## 2010-11-24 ENCOUNTER — Other Ambulatory Visit: Payer: Self-pay | Admitting: Internal Medicine

## 2010-11-24 NOTE — Telephone Encounter (Signed)
A1C/malb DUE 250.00

## 2010-11-30 ENCOUNTER — Other Ambulatory Visit: Payer: Self-pay

## 2010-12-04 ENCOUNTER — Other Ambulatory Visit: Payer: Self-pay

## 2010-12-22 ENCOUNTER — Other Ambulatory Visit: Payer: Self-pay | Admitting: *Deleted

## 2010-12-22 ENCOUNTER — Other Ambulatory Visit: Payer: Self-pay | Admitting: Internal Medicine

## 2010-12-22 DIAGNOSIS — E119 Type 2 diabetes mellitus without complications: Secondary | ICD-10-CM

## 2010-12-22 MED ORDER — GLIMEPIRIDE 1 MG PO TABS: 1.0000 mg | ORAL_TABLET | Freq: Every day | ORAL | Status: DC

## 2010-12-25 ENCOUNTER — Other Ambulatory Visit (INDEPENDENT_AMBULATORY_CARE_PROVIDER_SITE_OTHER): Payer: BC Managed Care – PPO

## 2010-12-25 DIAGNOSIS — E119 Type 2 diabetes mellitus without complications: Secondary | ICD-10-CM

## 2010-12-25 LAB — MICROALBUMIN / CREATININE URINE RATIO
Creatinine,U: 20.8 mg/dL
Microalb Creat Ratio: 71.5 mg/g — ABNORMAL HIGH (ref 0.0–30.0)
Microalb, Ur: 14.9 mg/dL — ABNORMAL HIGH (ref 0.0–1.9)

## 2010-12-25 LAB — HEMOGLOBIN A1C: Hgb A1c MFr Bld: 6.4 % (ref 4.6–6.5)

## 2010-12-26 NOTE — Cardiovascular Report (Signed)
[name], [name]                ACCOUNT NO.:  [id number]   MEDICAL RECORD NO.:  [id number]          PATIENT TYPE:  OIB   LOCATION:  6526                         FACILITY:  [facility]   PHYSICIAN:  Madaline Savage, M.D.DATE OF BIRTH:  [birth year]   DATE OF PROCEDURE:  DATE OF DISCHARGE:                            CARDIAC CATHETERIZATION   PROCEDURES PERFORMED:  1. Selective coronary angiography by Judkins technique.  2. Retrograde left heart catheterization.  3. Left ventricular angiography.   COMPLICATIONS:  None.   ENTRY SITE:  Right femoral.   DYE USED:  Omnipaque.   PATIENT PROFILE:  The patient is a 60 year old white female who is a  medical patient of Marga Melnick, MD, and a neurosurgical patient of  Dr. Hilda Lias.  She has known coronary artery disease that has been  treated medically for at least 5 years and a recent stress test was done  because the patient is diabetic and really feels no angina, but her  stress test at this time showed the interval development of new  reversible perfusion abnormalities to the posterolateral wall.  The  patient was notified of the abnormality and scheduled for this  outpatient cardiac catheterization to [facility].  The procedure was  performed electively without any complications, and results are  described below.   Left ventricular pressure 150/14, end-diastolic pressure 25.  Central  aortic pressure 157/70, mean of 103.  No aortic valve gradient was  noted.   ANGIOGRAPHIC RESULTS:  The patient has trivial proximal left anterior  descending coronary artery calcification.  The remainder of her left and  right coronary arterial tree did not appear to be calcified.   The left main coronary artery is normal.  The LAD courses the cardiac  apex and gives rise to 1 major diagonal branch arising just before  septal perforator branch #1, and there is a second diagonal branch which  is much smaller.  There are luminal irregularities  throughout the LAD,  no more than 30%.  There is nothing of an obstructive nature noted.   The circumflex coronary artery is the dominant vessel of circulation,  giving rise to a small but long proximal first obtuse marginal branch  followed by a trifurcating second obtuse marginal branch arising very  close to the first obtuse marginal branch.  No lesions were seen in the  either of these vessels.  The circumflex itself has a course through the  atrioventricular groove, is a large 4.25 to 4.5-mm vessel, and distally  bifurcates into 2 posterolateral branches.  The more proximal of which  contains a 75-90% stenosis depending on which view one looks at.  The  second posterolateral branch is more diffusely diseased and smaller in  diameter, both of these vessels compress the dominant circulation to the  inferior and lateral wall.   The right coronary artery is a small nondominant vessel which bifurcates  in the midportion of the vessel.  It is about 75% stenosed in the  midportion of this vessel.  This does not appear to be amenable nor is  it advised for this patient because  of the small nature of this  nondominant vessel.   Left ventricular angiogram shows a lot of ventricular ectopy, but  ejection fraction is 60% with no wall motion abnormalities, no mitral  regurgitation, no obvious LV thrombus.  Percutaneous coronary  intervention was performed via right percutaneous femoral artery  approach by removing the 5-French catheter which was used for diagnostic  cardiac catheterization and using the 6-French catheter.  The guide  catheter used to intubate the left main coronary artery to complete the  case was a XBLAD Cordis guide catheter, which provided excellent backup.  The wire used was an ATW marker wire, and the predilatation balloon was  a 2.5 x 10-12 mm predilatation balloon from [employer].  After  completing predilatation, the stent was deployed into the proximal   portion of this first posterolateral branch and a 2.5 x 16 mm TAXUS  monorail stent was deployed and then was postdilated with a Durastar  balloon 2.75 x 15, and the result was rather amazing.  The stenotic  vessel was now a 2.75 mm vessel with excellent TIMI 3 flow into the  distal branches and no complications occurred.   Angiomax was used during the case.  We also gave the patient 600 mg of  Plavix because we will plan for the patient to stay overnight and be  discharged tomorrow and followed up in our office at [facility].   FINAL IMPRESSIONS:  1. Type 2 diabetes.  2. Obesity.  3. Multiple cardiovascular risk factors with known coronary artery      disease dating back at least 5 years.  4. Recent new development of ischemia to the inferolateral wall by      Persantine Myoview stress testing.  5. Intracoronary artery stenting of the posterolateral branch of the      dominant circumflex coronary artery.  6. Normal left ventricular systolic function.   PLAN:  The patient will spend the night at [facility].  We will  continue on an antiplatelet agent as an outpatient.  She will follow the  excellent care of Marga Melnick, her primary care physician, and see me  as an outpatient as her cardiologist.           ______________________________  Madaline Savage, M.D.     WHG/MEDQ  D:  05/26/2008  T:  05/26/2008  Job:  [id number]   cc:   Titus Dubin. Alwyn Ren, MD,FACP,FCCP  [facility]

## 2010-12-26 NOTE — Op Note (Signed)
[name], [name]                ACCOUNT NO.:  [id number]   MEDICAL RECORD NO.:  [id number]          PATIENT TYPE:  AMB   LOCATION:  ENDO                         FACILITY:  [facility]   PHYSICIAN:  Bernette Redbird, M.D.   DATE OF BIRTH:  12-19-1950   DATE OF PROCEDURE:  07/22/2008  DATE OF DISCHARGE:                               OPERATIVE REPORT   PROCEDURE:  Upper endoscopy with biopsies.   INDICATIONS:  A 60 year old female with longstanding GERD symptoms  adequately controlled on Prilosec, but with some recent onset asthma  raising the question of LPR.   FINDINGS:  Normal exam.   DESCRIPTION OF PROCEDURE:  The nature, purpose and risks of the  procedure had been discussed with the patient who provided written  consent.  Sedation was propofol by the Anesthesia Department.  The  Pentax video endoscope was passed under direct vision.  The larynx and  vocal cords looked grossly normal.  The esophagus was readily entered  and was normal in its entirety.  No free reflux, reflux esophagitis,  Barrett's esophagus, varices, infection, neoplasia, ring, stricture, or  hiatal hernia were appreciated.   The stomach contained a small clear residual that was suctioned up.  No  gastritis, erosions, ulcers, polyps or masses were seen including a  retroflexed view of the cardia.  The pylorus and duodenum looked normal.  Several duodenal biopsies were obtained because of a history of  normocytic anemia.  The scope was then removed from the patient who  tolerated the procedure well and without apparent complication.   IMPRESSION:  Normal endoscopy.  No adverse mucosal sequelae of  gastroesophageal reflux disease identified.  No overt reflux laryngitis.   PLAN:  Continue current medical therapy for GERD.  Await pathology on  duodenal biopsies.           ______________________________  Bernette Redbird, M.D.     RB/MEDQ  D:  07/22/2008  T:  07/22/2008  Job:  [id number]   cc:   Titus Dubin. Alwyn Ren,  MD,FACP,FCCP  [phone] [place]  [place] [address]

## 2010-12-26 NOTE — Op Note (Signed)
[name], [name]                ACCOUNT NO.:  [id number]   MEDICAL RECORD NO.:  [id number]          PATIENT TYPE:  AMB   LOCATION:  ENDO                         [facility]ACILITY:  [facility]   PHYSICIAN:  Bernette Redbird, M.D.   DATE OF BIRTH:  1951-05-22   DATE OF PROCEDURE:  07/22/2008  DATE OF DISCHARGE:                               OPERATIVE REPORT   PROCEDURE:  Colonoscopy with biopsies.   INDICATIONS:  A 60 year old female with anemia and alteration of bowel  habit.   FINDINGS:  Normal exam to the terminal ileum other than perhaps a small  AVM in the proximal colon.   PROCEDURE IN DETAIL:  The nature, purpose and risks of the procedure had  been discussed with the patient who provided written consent.  The  Pentax adult video colonoscope was readily advanced around the colon to  the terminal ileum which had a normal appearance and pullback was then  performed.  The quality of the prep was very good and it is felt that  all areas were well seen.   Just above the cecum was what appeared to be a small 4-6 mm vascular  malformation.  Otherwise, this was a normal examination.  No polyps,  cancer, colitis, vascular malformations or diverticulosis were noted.   Retroflexion in the rectum and reinspection of the rectum were normal.   Random mucosal biopsies were obtained along the length of the colon  except the rectum to look for evidence of collagenous colitis or  microscopic colitis since the patient has had some periodic diarrhea in  the past although that is now better.  The patient tolerated the  procedure well and there were no apparent complications.   IMPRESSION:  Essentially normal colonoscopy.  Questionable tiny proximal  colonic AVM (arteriovenous malformation).   PLAN:  Await pathology results on random biopsies.  Consider repeat  colonoscopy for screening in 10 years.           ______________________________  Bernette Redbird, M.D.     RB/MEDQ  D:  07/22/2008  T:   07/22/2008  Job:  [id number]   cc:   Titus Dubin. Alwyn Ren, MD,FACP,FCCP  [phone] [place]  [place] [address]

## 2010-12-26 NOTE — Discharge Summary (Signed)
[name], [name]                ACCOUNT NO.:  [id number]   MEDICAL RECORD NO.:  [id number]          PATIENT TYPE:  OIB   LOCATION:  6526                         FACILITY:  [facility]   PHYSICIAN:  [name], M.D.DATE OF BIRTH:  August 16, 1950   DATE OF ADMISSION:  05/26/2008  DATE OF DISCHARGE:  05/27/2008                               DISCHARGE SUMMARY   DISCHARGE DIAGNOSES:  1. Abnormal stress test secondary to known coronary artery disease.  2. Known coronary artery disease with cardiac catheterization in 2006      with up to 70% blockages and ejection fraction of 68%, now with      positive nuclear study as an outpatient in a diabetic female with      hyperlipidemia and hypertension.  3. Progression of coronary disease with 90% left circumflex stenosis,      undergoing stent deployment.  Ejection fraction 60%.  4. Diabetes mellitus type 2.  5. History of panic disorder.  6. Hyperlipidemia  7. Claudication of left lower extremity.  8. Back pain followed by Dr. Jeral Morrison.   DISCHARGE MEDICATIONS:  1. Aspirin 325 mg daily enteric-coated.  2. Azmacort 20 grams p.r.n.  3. Albuterol 17 grams as needed as before.  4. Lorazepam 0.5 mg at bedtime as before.  5. Darvocet-N 100 600 mg as needed.  6. Claritin 10 mg as needed.  7. Tylenol 650 mg twice a day.  8. Prilosec 20 mg daily.  9. Mucinex 600 mg as needed.  10.Multivitamin with iron daily.  11.Vitamin C 1000 mg daily.  12.Cal-Mag Plus daily.  13.Metformin 1000 mg twice a day.  She is not to take this until      May 29, 2008 and to resume at 1000 mg twice a day.  14.Accupril 40 mg daily.  15.Atenolol 50 mg daily.  16.Paxil 25 mg daily.  17.Estradiol 0.5 mg daily.  18.Glimepiride 2 mg daily.  19.Timoptic 25% one drop at bedtime, both eyes.  20.Advair 5 twice a day inhalations.  21.Celebrex 200 mg twice a day.  22.Crestor 10 mg daily.  23.Amlodipine 10 mg daily.  24.Actos 45 mg daily.  25.Melatonin 5 mg at bedtime.  26.Omega-3 1000 mg daily.  27.Zetia 10 mg daily.  He asked to take both Zetia and Crestor.  28.Enteric-coated aspirin 325 mg daily.  29.Do not stop Effient 10 mg daily, this is instead of Plavix.  The      patient is instructed not to stop, it could cause a heart attack.  30.Nitroglycerin 1/150th one under the tongue as needed for chest pain      while sitting up to 2 every 15 minutes as needed.   DISCHARGE INSTRUCTIONS:  1. No work until Monday on May 31, 2008.  2. Low-sodium heart-healthy diabetic diet.  3. Wash cath site with soap and water.  Call if any bleeding,      swelling, or drainage.  4. Increase activity slowly, may shower, no lifting for 2 days, and no      driving for 2 days.  5. Follow with Dr. Elsie Morrison, June 17, 2008 at 12 noon.  Go by Dr.      Truett Morrison office today for more samples of the Effient.  6. You are scheduled for lower extremity Dopplers, June 03, 2008 at      Dr. Truett Morrison office on the second floor.  These have previously      been scheduled.   HISTORY OF PRESENT ILLNESS:  This is a 60 year old female patient of Dr.  Elsie Morrison who was seen in the office in August.  She has known coronary  disease with less than 70% stenosis on a cardiac cath in 2006 and 50% in  the left circumflex, that is a predominant artery.  She did not complain  of angina in the office.  EF in 2006 of 68%, but she has been having  back pain and has seen Dr. Jeral Morrison.  MRI of the back was stable and  because of her diabetes Dr. Elsie Morrison felt she needed to undergo a stress  test which she did which showed new ischemia.   Other history includes panic disorder, weight gain, and dyslipidemia.  He had actually put her on Crestor in August, but I believe she stopped  the Zetia in the meantime.  I think the plan was to have her on both.  She also uses inhalers and I do not have information if that is for COPD  or asthma.  Because of the ischemia and the patient's history of  diabetes and  possibility of silent ischemia, the patient was brought in  for outpatient cardiac catheterization on May 26, 2008.  She was  found to have increased stenosis of the left circumflex now at 90%.  Also, she had 60-70% and 75% stenosis that will be treated medically.   The patient had procedure, was admitted to observation unit, and did  well, though around midnight she did have some oozing and bleeding at  the cath site.  Pressure was held and small hematoma was present which  resolved after holding pressure for 13 minutes.  The patient remained  stable afterwards.   DISCHARGE PHYSICAL EXAMINATION:  VITAL SIGNS:  Blood pressure 146/56,  pulse 76, respirations 20, and temperature 98.4.  HEART:  Regular rate and rhythm.  LUNGS:  Clear.  No bleeding at the right groin cath site, moderate  bruising at the right groin.  No bruits.  She does have a 2/6 systolic  murmur.   LABORATORY DATA:  We did lipid panel.  Total cholesterol was 142,  triglycerides 106, HDL 51, and LDL 70.  Hemoglobin 10.6, hematocrit 31,  WBC 12.6, and platelets 274.  Sodium 138, potassium 4, chloride 105, CO2  26, glucose 120, BUN 10, and creatinine 0.65.  Previous LDL done on January 23, 2008 was 143.   Chest x-ray prior to procedure:  No active lung disease.   HOSPITAL COURSE:  As stated.   FOLLOWUP:  The patient will follow up with Dr. Elsie Morrison in the office as  well as Dr. Alwyn Morrison, as her primary care physician.   Please note, we have placed her on Effient instead of Plavix and samples  were given as well as a prescription.  Please note, on further  investigation on the Crestor alone and I will clarify alone at the time  of discharge, the patient's LDL was actually quite good with a LDL of  70, which is improved from June.  Therefore, unless she has been taking  both the Zetia and the Crestor, she will be discharged on Crestor alone,  because that seems  to be controlling things fairly well.  She has been  on it  since August.      [name]. [name], N.P.    ______________________________  [name], M.D.    LRI/MEDQ  D:  05/27/2008  T:  05/28/2008  Job:  [id number]   cc:   Titus Dubin. Janice Ren, MD,FACP,FCCP

## 2011-01-05 ENCOUNTER — Telehealth: Payer: Self-pay | Admitting: Internal Medicine

## 2011-01-05 NOTE — Telephone Encounter (Signed)
I called patient and left message with A1c results and explained the delay in her getting results (system error-that is in process of being fixed). I printed labs and will have Dr.[name] address then copy to be mailed to patient.   Patient to call if any additional questions and concerns

## 2011-01-24 ENCOUNTER — Other Ambulatory Visit: Payer: Self-pay | Admitting: Internal Medicine

## 2011-01-29 ENCOUNTER — Other Ambulatory Visit: Payer: Self-pay | Admitting: Obstetrics and Gynecology

## 2011-01-29 DIAGNOSIS — Z1231 Encounter for screening mammogram for malignant neoplasm of breast: Secondary | ICD-10-CM

## 2011-02-22 ENCOUNTER — Other Ambulatory Visit: Payer: Self-pay | Admitting: Internal Medicine

## 2011-02-26 ENCOUNTER — Other Ambulatory Visit: Payer: Self-pay | Admitting: Internal Medicine

## 2011-04-13 ENCOUNTER — Ambulatory Visit: Payer: BC Managed Care – PPO

## 2011-04-18 ENCOUNTER — Other Ambulatory Visit: Payer: Self-pay | Admitting: Cardiovascular Disease

## 2011-04-18 ENCOUNTER — Ambulatory Visit
Admission: RE | Admit: 2011-04-18 | Discharge: 2011-04-18 | Disposition: A | Discharge status: Home / Self Care | Payer: BC Managed Care – PPO | Source: Ambulatory Visit | Attending: Cardiovascular Disease | Admitting: Cardiovascular Disease

## 2011-04-18 DIAGNOSIS — Z01811 Encounter for preprocedural respiratory examination: Secondary | ICD-10-CM

## 2011-04-18 DIAGNOSIS — R0602 Shortness of breath: Secondary | ICD-10-CM

## 2011-04-20 ENCOUNTER — Ambulatory Visit (HOSPITAL_COMMUNITY)
Admission: RE | Admit: 2011-04-20 | Discharge: 2011-04-20 | Disposition: A | Discharge status: Home / Self Care | Payer: BC Managed Care – PPO | Source: Ambulatory Visit | Attending: Cardiovascular Disease | Admitting: Cardiovascular Disease

## 2011-04-20 DIAGNOSIS — I10 Essential (primary) hypertension: Secondary | ICD-10-CM | POA: Insufficient documentation

## 2011-04-20 DIAGNOSIS — R079 Chest pain, unspecified: Secondary | ICD-10-CM | POA: Insufficient documentation

## 2011-04-20 DIAGNOSIS — E785 Hyperlipidemia, unspecified: Secondary | ICD-10-CM | POA: Insufficient documentation

## 2011-04-20 DIAGNOSIS — Z87891 Personal history of nicotine dependence: Secondary | ICD-10-CM | POA: Insufficient documentation

## 2011-04-20 DIAGNOSIS — E119 Type 2 diabetes mellitus without complications: Secondary | ICD-10-CM | POA: Insufficient documentation

## 2011-04-20 DIAGNOSIS — J45909 Unspecified asthma, uncomplicated: Secondary | ICD-10-CM | POA: Insufficient documentation

## 2011-04-20 DIAGNOSIS — I2510 Atherosclerotic heart disease of native coronary artery without angina pectoris: Secondary | ICD-10-CM | POA: Insufficient documentation

## 2011-04-20 LAB — GLUCOSE, CAPILLARY
Glucose-Capillary: 167 mg/dL — ABNORMAL HIGH (ref 70–99)
Glucose-Capillary: 170 mg/dL — ABNORMAL HIGH (ref 70–99)
Glucose-Capillary: 191 mg/dL — ABNORMAL HIGH (ref 70–99)

## 2011-04-23 ENCOUNTER — Other Ambulatory Visit: Payer: Self-pay | Admitting: Internal Medicine

## 2011-04-24 ENCOUNTER — Ambulatory Visit (INDEPENDENT_AMBULATORY_CARE_PROVIDER_SITE_OTHER): Payer: BC Managed Care – PPO | Admitting: Internal Medicine

## 2011-04-24 ENCOUNTER — Encounter: Payer: Self-pay | Admitting: Internal Medicine

## 2011-04-24 VITALS — BP 140/60 | HR 72 | Ht 62.0 in | Wt 213.0 lb

## 2011-04-24 DIAGNOSIS — R1011 Right upper quadrant pain: Secondary | ICD-10-CM

## 2011-04-24 LAB — CBC WITH DIFFERENTIAL/PLATELET
Basophils Absolute: 0 10*3/uL (ref 0.0–0.1)
Basophils Relative: 0.4 % (ref 0.0–3.0)
Eosinophils Absolute: 0.3 10*3/uL (ref 0.0–0.7)
Eosinophils Relative: 3 % (ref 0.0–5.0)
HCT: 36.9 % (ref 36.0–46.0)
Hemoglobin: 12.4 g/dL (ref 12.0–15.0)
Lymphocytes Relative: 11.3 % — ABNORMAL LOW (ref 12.0–46.0)
Lymphs Abs: 1.1 10*3/uL (ref 0.7–4.0)
MCHC: 33.7 g/dL (ref 30.0–36.0)
MCV: 90.4 fl (ref 78.0–100.0)
Monocytes Absolute: 0.5 10*3/uL (ref 0.1–1.0)
Monocytes Relative: 4.7 % (ref 3.0–12.0)
Neutro Abs: 7.9 10*3/uL — ABNORMAL HIGH (ref 1.4–7.7)
Neutrophils Relative %: 80.6 % — ABNORMAL HIGH (ref 43.0–77.0)
Platelets: 278 10*3/uL (ref 150.0–400.0)
RBC: 4.08 Mil/uL (ref 3.87–5.11)
RDW: 13.7 % (ref 11.5–14.6)
WBC: 9.7 10*3/uL (ref 4.5–10.5)

## 2011-04-24 MED ORDER — ACETAMINOPHEN-CODEINE 300-30 MG PO TABS: 1.0000 | ORAL_TABLET | Freq: Four times a day (QID) | ORAL | Status: DC | PRN

## 2011-04-24 MED ORDER — SITAGLIPTIN PHOS-METFORMIN HCL 50-1000 MG PO TABS: ORAL_TABLET | ORAL | Status: DC

## 2011-04-24 NOTE — Progress Notes (Signed)
  Subjective:    Patient ID: [name], female    DOB: [birth year], 60 y.o.   MRN: [id number]  HPI ABDOMINAL PAIN: Location: epigastric   Onset: 04/22/2011; minor episodes in past month   Radiation: R inferior thorax  Severity: up to 10 Quality: aching & pressure loke  Duration: 45 min as intense pain; total duration 45 min  Better with: no definite  Relievers but ? Slight response with burping  Worse with: no triggers Symptoms Nausea/Vomiting: yes, unable to vomit  Diarrhea: no  Constipation: no  Melena/BRBPR: no  Hematemesis: no  Anorexia: yes,   Fever/Chills: yes, hands clammy w/o fever  Dysuria: no  Rash: no  EtOH use: no  NSAIDs/ASA: no  , only EC ASA 325 mg daily  Past Surgeries: She had both colonoscopy and upper endoscopy in 2009 it Dr. Matthias Hughs; both were normal.  She saw her cardiologist, Dr. Gery Pray for the epigastric discomfort. Catheterization 9/7 was negative for significant disease.       Review of Systems   She denies cola  colored urine or clay-colored stool . She denies dysuria, hematuria or pyuria.       Objective:   Physical Exam General appearance is one of good nourishment w/o distress.  Eyes: No conjunctival inflammation or scleral icterus is present.  Oral exam: Dental hygiene is good; lips and gums are healthy appearing.There is no oropharyngeal erythema or exudate noted.   Heart:  Normal rate and regular rhythm. S1 and S2 normal without gallop,  click, rub . Grade 1/6 basilar systolic murmur.    Lungs:Chest clear to auscultation; no wheezes, rhonchi,rales ,or rubs present.No increased work of breathing.   Abdomen: bowel sounds normal, soft but tender RUQ  without masses, organomegaly or hernias noted.  No guarding or rebound   Skin:Warm & dry.  Intact without suspicious lesions or rashes ; no jaundice or tenting  Lymphatic: No lymphadenopathy is noted about the head, neck, axilla  areas.              Assessment & Plan:   #1  abdominal pain with tenderness right upper quadrant, rule out gallbladder disease  Plan: See orders and recommendations.

## 2011-04-24 NOTE — Telephone Encounter (Signed)
A1c 250.00 due in October

## 2011-04-24 NOTE — Patient Instructions (Signed)
Please complete stool cards. 

## 2011-04-25 LAB — HEPATIC FUNCTION PANEL
ALT: 36 U/L — ABNORMAL HIGH (ref 0–35)
AST: 41 U/L — ABNORMAL HIGH (ref 0–37)
Albumin: 3.8 g/dL (ref 3.5–5.2)
Alkaline Phosphatase: 52 U/L (ref 39–117)
Bilirubin, Direct: 0 mg/dL (ref 0.0–0.3)
Total Bilirubin: 0.4 mg/dL (ref 0.3–1.2)
Total Protein: 6.7 g/dL (ref 6.0–8.3)

## 2011-04-25 LAB — LIPASE: Lipase: 50 U/L (ref 11.0–59.0)

## 2011-04-25 LAB — AMYLASE: Amylase: 35 U/L (ref 27–131)

## 2011-05-03 ENCOUNTER — Other Ambulatory Visit (HOSPITAL_COMMUNITY): Payer: BC Managed Care – PPO

## 2011-05-04 ENCOUNTER — Ambulatory Visit (HOSPITAL_COMMUNITY)
Admission: RE | Admit: 2011-05-04 | Discharge: 2011-05-04 | Disposition: A | Discharge status: Home / Self Care | Payer: BC Managed Care – PPO | Source: Ambulatory Visit | Attending: Internal Medicine | Admitting: Internal Medicine

## 2011-05-04 DIAGNOSIS — R109 Unspecified abdominal pain: Secondary | ICD-10-CM | POA: Insufficient documentation

## 2011-05-04 DIAGNOSIS — I10 Essential (primary) hypertension: Secondary | ICD-10-CM | POA: Insufficient documentation

## 2011-05-04 DIAGNOSIS — R112 Nausea with vomiting, unspecified: Secondary | ICD-10-CM | POA: Insufficient documentation

## 2011-05-04 DIAGNOSIS — E119 Type 2 diabetes mellitus without complications: Secondary | ICD-10-CM | POA: Insufficient documentation

## 2011-05-04 DIAGNOSIS — K8020 Calculus of gallbladder without cholecystitis without obstruction: Secondary | ICD-10-CM | POA: Insufficient documentation

## 2011-05-04 DIAGNOSIS — R1011 Right upper quadrant pain: Secondary | ICD-10-CM

## 2011-05-04 DIAGNOSIS — K838 Other specified diseases of biliary tract: Secondary | ICD-10-CM | POA: Insufficient documentation

## 2011-05-09 ENCOUNTER — Telehealth: Payer: Self-pay

## 2011-05-09 DIAGNOSIS — K829 Disease of gallbladder, unspecified: Secondary | ICD-10-CM

## 2011-05-09 NOTE — Telephone Encounter (Signed)
Spoke with patient about recent Abdominal U/S results, patient states she will call back tomorrow with preference for surgeon. Patient requested that I fax report to [phone] (FAXED)

## 2011-05-10 NOTE — Telephone Encounter (Signed)
Patient called back and stated she would like Dr.Haywood Ingram(1st choice) or Dr.Matthew Wakefield(2nd choice)

## 2011-05-11 ENCOUNTER — Ambulatory Visit
Admission: RE | Admit: 2011-05-11 | Discharge: 2011-05-11 | Disposition: A | Discharge status: Home / Self Care | Payer: BC Managed Care – PPO | Source: Ambulatory Visit | Attending: Obstetrics and Gynecology | Admitting: Obstetrics and Gynecology

## 2011-05-11 DIAGNOSIS — Z1231 Encounter for screening mammogram for malignant neoplasm of breast: Secondary | ICD-10-CM

## 2011-05-13 NOTE — Cardiovascular Report (Signed)
[name], [name] NO.:  [id number]  MEDICAL RECORD NO.:  [id number]  LOCATION:  [facility]                         FACILITY:  [facility]  PHYSICIAN:  Nanetta Batty, M.D.   DATE OF BIRTH:  1951/06/06  DATE OF PROCEDURE: DATE OF DISCHARGE:  04/20/2011                           CARDIAC CATHETERIZATION   HISTORY OF PRESENT ILLNESS:  [name] is a 60 year old mild-to- moderately overweight divorced Caucasian female, mother of two adopted children ages 66 and 66, who is formerly a patient of Dr. Elsie Lincoln.  I recently saw her on April 11, 2011.  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  Her risk factors include former tobacco abuse; quit 17 years ago, diabetes, hypertension, hyperlipidemia, and family history.  She does have adult-onset asthma. Dr. Elsie Lincoln stented her posterolateral branch on May 26, 2008, and she did have some mild residual disease in the PDA beyond that.  She is complaining of some chest pain.  Myoview performed on September 29, was nonischemic.  Because of ongoing chest pain radiating to her left neck along with dyspnea, she presents now for angiography to rule out an ischemic etiology.  DESCRIPTION OF PROCEDURE:  The patient was brought to the second floor [facility] in the postabsorptive state.  She was premedicated with p.o. Valium, IV Versed, and fentanyl.  Her right groin was prepped and shaved in the usual sterile fashion.  An 1% Xylocaine was used to perform local anesthesia.  A 5-French sheath was inserted into the right femoral artery using standard Seldinger technique.  The 5- French right and left Judkins diagnostic catheter along with 5-French pigtail catheter, and a No-Torque catheter were used for selective coronary angiography and left ventriculography respectively.  Visipaque dye was used for the entirety of the case.  Retrograde aortic, left ventricular, and pullback blood pressures were  recorded.  HEMODYNAMIC RESULTS: 1. Aortic systolic pressure 164, diastolic pressure 71. 2. Left ventricular systolic pressure 163, end-diastolic pressure 22.  SELECTIVE CORONARY ANGIOGRAPHY: 1. Left main:  Left main had 30% ostial stenosis. 2. Left ascending descending:  The LAD was free of significant     disease. 3. Left circumflex:  Left circumflex was dominant.  PLA branch had     widely patent stent.  The PDA had almost 50% segmental stenosis. 4. Right coronary:  Nondominant with 50-60% stenosis in the     midportion, unchanged from prior cath. 5. Left ventriculography:  RAO left ventriculogram was performed using     25 mL of Visipaque dye at 12 mL per second.  The overall LVEF     estimated greater than 60% without focal wall motion abnormalities.  IMPRESSION:  [name] has noncritical coronary artery disease with normal left ventricular function.  I do not think her chest pain is cardiac in nature.  A groin shot was performed demonstrating suitable anatomy for hemostatic closure device.  A 5-French ExoSeal was used to seal the right groin and pressure was held in the groin after that for several minutes to achieve hemostasis.  The patient left the room in stable condition.  She will be gently hydrated for 2-3 hours, remain recumbent, and  will be discharged home.  I will see her back in the office in 1-2 weeks for followup.     Nanetta Batty, M.D.     [name]  D:  04/20/2011  T:  04/21/2011  Job:  [id number]  cc:   [facility] Titus Dubin. Alwyn Ren, MD,FACP,FCCP  Electronically Signed by Nanetta Batty M.D. on 05/13/2011 11:45:47 AM

## 2011-05-15 LAB — CBC
HCT: 31.5 — ABNORMAL LOW
Hemoglobin: 10.6 — ABNORMAL LOW
MCHC: 33.7
MCV: 90.4
Platelets: 274
RBC: 3.48 — ABNORMAL LOW
RDW: 13.1
WBC: 12.6 — ABNORMAL HIGH

## 2011-05-15 LAB — GLUCOSE, CAPILLARY
Glucose-Capillary: 132 — ABNORMAL HIGH
Glucose-Capillary: 172 — ABNORMAL HIGH
Glucose-Capillary: 187 — ABNORMAL HIGH
Glucose-Capillary: 191 — ABNORMAL HIGH
Glucose-Capillary: 198 — ABNORMAL HIGH
Glucose-Capillary: 266 — ABNORMAL HIGH

## 2011-05-15 LAB — BASIC METABOLIC PANEL
BUN: 10
CO2: 26
Calcium: 8.8
Chloride: 105
Creatinine, Ser: 0.65
GFR calc Af Amer: >60
GFR calc non Af Amer: >60
Glucose, Bld: 120 — ABNORMAL HIGH
Potassium: 4
Sodium: 138

## 2011-05-15 LAB — LIPID PANEL
Cholesterol: 142
HDL: 51
LDL Cholesterol: 70
Total CHOL/HDL Ratio: 2.8
Triglycerides: 106
VLDL: 21

## 2011-05-15 LAB — URINE CULTURE
Colony Count: NO GROWTH
Culture: NO GROWTH

## 2011-05-17 LAB — GLUCOSE, CAPILLARY
Glucose-Capillary: 123 mg/dL — ABNORMAL HIGH (ref 70–99)
Glucose-Capillary: 138 mg/dL — ABNORMAL HIGH (ref 70–99)

## 2011-05-24 ENCOUNTER — Encounter (INDEPENDENT_AMBULATORY_CARE_PROVIDER_SITE_OTHER): Payer: Self-pay | Admitting: General Surgery

## 2011-05-24 ENCOUNTER — Ambulatory Visit (INDEPENDENT_AMBULATORY_CARE_PROVIDER_SITE_OTHER): Payer: BC Managed Care – PPO | Admitting: General Surgery

## 2011-05-24 VITALS — BP 142/62 | HR 60 | Temp 96.6°F | Resp 16 | Ht 62.0 in | Wt 216.5 lb

## 2011-05-24 DIAGNOSIS — K8020 Calculus of gallbladder without cholecystitis without obstruction: Secondary | ICD-10-CM

## 2011-05-24 NOTE — Patient Instructions (Signed)
Youwill be scheduled for a laparoscopic cholecystectomy with cholangiogram at [facility].  Laparoscopic Removal of Gallbladder (Cholecystectomy), Care After Refer to this sheet in the next few weeks. These discharge instructions provide you with general information on caring for yourself after you leave the hospital. Your caregiver may also give you specific instructions. Your treatment has been planned according to the most current medical practices available, but unavoidable complications sometimes occur. If you have any problems or questions after discharge, please call your caregiver. HOME CARE INSTRUCTIONS  You may put ice on the surgical site.   Put ice in a plastic bag.   Place a towel between your skin and the bag.   Leave the ice on for 10 minutes, 4  times per day.   Change bandages (dressings) as directed.   Keep the wound dry and clean. The wound may be washed gently with a soap that kills germs (antimicrobial). Gently blot or dab dry. Do not rub the wound.   Do not take baths or use swimming pools or hot tubs for 10 days, or as instructed by your caregiver.   Only take over-the-counter or prescription medicines for pain, discomfort, or fever as directed by your caregiver.   You may continue your normal diet as directed.   Do not lift anything more than 10 pounds or play contact sports for 3 weeks, or as directed.   SEEK MEDICAL CARE IF:  There is redness, swelling, or increasing pain in the wound.   You notice pus coming from the wound.   There is drainage from a wound lasting longer than 1 day.   You have an oral temperature above 101.   There is a bad smell coming from the wound or dressing.   The cut (incision) breaks open.  SEEK IMMEDIATE MEDICAL CARE IF:  You develop a rash.   You have difficulty breathing.   You develop any reaction or side effects to medicines given.   You have an oral temperature above 101, not controlled by medicine.    You have increasing pain in the shoulders (shoulder strap areas). Some pain is common and expected because of the gas inserted into your belly (abdomen) during the procedure.   You develop dizzy episodes or fainting while standing.   You develop severe belly (abdominal) pain.   You develop persistent nausea or vomiting lasting more than 1 day.  MAKE SURE YOU:  Understand these instructions.   Will watch your condition.   Will get help right away if you are not doing well or get worse.  Document Released: 07/30/2005 Document Re-Released: 01/17/2010 [facility] Patient Information 2011 [place], [place].

## 2011-05-24 NOTE — Progress Notes (Signed)
Chief Complaint  Patient presents with  . Other    eval of GB with sludge     HPI [name] is a 60 y.o. female.    She was referred to me by Dr. Marga Melnick for evaluation of symptomatic gallstones. Her cardiologist is Dr. Nanetta Batty.  The patient gives a several month history of vague right-sided and epigastric discomfort radiating up into the chest. She has a history of cardiac stent placement in 2009. She got reevaluated by Dr. Nanetta Batty and had a cardiac catheter and was told it was no change. On April 28, 2011 she had a very severe episode of right upper quadrant epigastric pain lasting about 45 minutes. She had nausea but no vomiting. She had chills but no fever. She's had no change in her bowel habits. She has not any more severe attacks of pain but now has intermittent daily pressure in the upper abdomen. She denies any history of jaundice or liver disease. She has not had a myocardial infarction. She does have asthma and pulmonary disease. She denies urologic problems.  After Alwyn Ren was sent her for an ultrasound which was done on May 04, 2011. This shows sludge and multiple tiny gallstones but no inflammatory change, common bile duct normal at 5 mm, probable fatty infiltration of liver.  She is asymptomatic today. She is here with her partner.  Significant history is of diabetes, obesity, hypertension, hyperlipidemia, coronary artery disease with stent placement 2009, asthma, history of pneumonia, history of abdominal hysterectomy. History of tobacco use, abstinent for 18 years.HPI  Past Medical History  Diagnosis Date  . Arthritis   . Asthma   . Diabetes mellitus   . GERD (gastroesophageal reflux disease)   . Heart murmur   . Hyperlipidemia   . Hypertension   . Cough   . Wheezing   . Palpitations   . Abdominal pain   . Nausea     Past Surgical History  Procedure Date  . Abdominal hysterectomy   . 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 No pertinent family history.  Social History History  Substance Use Topics  . Smoking status: Former Games developer  . Smokeless tobacco: Never Used  . Alcohol Use: No    Allergies  Allergen Reactions  . Clopidogrel Bisulfate     Nausea & pain    Current Outpatient Prescriptions  Medication Sig Dispense Refill  . ACCUPRIL 40 MG tablet TAKE 1 TABLET ONCE DAILY.  30 each  9  . albuterol (PROVENTIL,VENTOLIN) 90 MCG/ACT inhaler Inhale 2 puffs into the lungs every 6 (six) hours as needed.        . AMARYL 1 MG tablet TAKE 1 TABLET ONCE DAILY.  30 each  5  . aspirin 325 MG tablet Take 325 mg by mouth daily.        [name] atenolol (TENORMIN) 50 MG tablet TAKE 1 TABLET ONCE DAILY.  30 tablet  9  . BACTROBAN 2 % ointment USE AS DIRECTED ONCE DAILY.  1 Tube  3  . bimatoprost (LUMIGAN) 0.01 % SOLN 1 drop at bedtime.        . budesonide-formoterol (SYMBICORT) 160-4.5 MCG/ACT inhaler Inhale 2 puffs into the lungs 2 (two) times daily.        . CELEBREX 200 MG capsule Take 200 mg by mouth daily.       . CRESTOR 10 MG tablet Take 10 mg by mouth daily.       [name]  dextromethorphan-guaiFENesin (MUCINEX DM) 30-600 MG per 12 hr tablet Take 1 tablet by mouth every 12 (twelve) hours.        [name] EFFIENT 10 MG TABS 10 mg daily.       [name] estradiol (ESTRACE) 0.5 MG tablet Take 0.5 mg by mouth daily.       . isosorbide dinitrate (ISORDIL) 30 MG tablet Take 30 mg by mouth.        [name] LORazepam (ATIVAN) 0.5 MG tablet Take 0.5 mg by mouth. 2 tabs daily      . NORVASC 10 MG tablet TAKE (1) TABLET DAILY FORHIGH BLOOD PRESSURE.  30 each  9  . omeprazole (PRILOSEC) 20 MG capsule Take 20 mg by mouth daily.        [name] PARoxetine (PAXIL-CR) 25 MG 24 hr tablet Take 25 mg by mouth every morning.        . sitaGLIPtan-metformin (JANUMET) 50-1000 MG per tablet 1/2 by mouth in the am, 1 by mouth in the pm  45 tablet  1  . triamcinolone (AZMACORT) 75 MCG/ACT inhaler Inhale 2 puffs into the lungs 2 (two) times  daily.          Review of Systems Review of Systems  Constitutional: Negative.   HENT: Negative.   Eyes: Negative.   Respiratory: Positive for cough, shortness of breath and wheezing. Negative for apnea, choking, chest tightness and stridor.   Gastrointestinal: Positive for nausea and abdominal pain. Negative for vomiting, diarrhea, constipation, blood in stool, abdominal distention, anal bleeding and rectal pain.  Genitourinary: Negative.   Musculoskeletal: Positive for back pain and arthralgias. Negative for myalgias, joint swelling and gait problem.  Skin: Negative.   Neurological: Negative.   Hematological: Negative.   Psychiatric/Behavioral: Negative.     Blood pressure 142/62, pulse 60, temperature 96.6 F (35.9 C), resp. rate 16, height 5\' 2"  (1.575 m), weight 216 lb 8 oz (98.204 kg).  Physical Exam Physical Exam  Constitutional: She is oriented to person, place, and time. She appears well-developed and well-nourished. No distress.       Obese. Weight 216.  HENT:  Head: Normocephalic and atraumatic.  Nose: Nose normal.  Mouth/Throat: No oropharyngeal exudate.  Eyes: Conjunctivae and EOM are normal. Pupils are equal, round, and reactive to light. Right eye exhibits no discharge. Left eye exhibits no discharge. No scleral icterus.  Neck: Normal range of motion. Neck supple. No JVD present. No tracheal deviation present. No thyromegaly present.  Cardiovascular: Normal rate, regular rhythm, normal heart sounds and intact distal pulses.   No murmur heard. Pulmonary/Chest: Effort normal and breath sounds normal. No respiratory distress. She has no wheezes. She has no rales. She exhibits no tenderness.  Abdominal: Soft. Bowel sounds are normal. She exhibits no distension and no mass. There is no tenderness. There is no rebound and no guarding.    Musculoskeletal: Normal range of motion. She exhibits no edema and no tenderness.  Lymphadenopathy:    She has no cervical  adenopathy.  Neurological: She is alert and oriented to person, place, and time. She exhibits normal muscle tone. Coordination normal.  Skin: Skin is warm and dry. No rash noted. She is not diaphoretic. No erythema. No pallor.  Psychiatric: She has a normal mood and affect. Her behavior is normal. Judgment and thought content normal.    Data Reviewed I reviewed Dr. Frederik Pear office notes and the recent ultrasound.  Assessment    Chronic cholecystitis with cholelithiasis. She is having ongoing biliary colic. She would  benefit from elective cholecystectomy before having a complication.  Morbid obesity  Diabetes  Hypertension  Asthma  History of pneumonia  History of abdominal hysterectomy and nephrectomy  History tobacco abuse, absent for 18 years.    Plan    We'll schedule for elective laparoscopic cholecystectomy with cholangiogram.  I have discussed indications and details of surgery with her and her partner. Risks and complications have been outlined, including but not limited to bleeding, infection, conversion to open laparotomy, bile leak, injury to adjacent organs such as the main bile duct or intestine with major reconstructive surgery, cardiac pulmonary and thromboembolic problems. She seems to understand these issues well. At this time all her questions were answered. She is in full agreement with this plan.       Eusevio Schriver M 05/24/2011, 12:13 PM

## 2011-05-31 ENCOUNTER — Other Ambulatory Visit: Payer: Self-pay | Admitting: Internal Medicine

## 2011-06-11 ENCOUNTER — Encounter (HOSPITAL_COMMUNITY): Payer: BC Managed Care – PPO

## 2011-06-11 ENCOUNTER — Other Ambulatory Visit (INDEPENDENT_AMBULATORY_CARE_PROVIDER_SITE_OTHER): Payer: Self-pay | Admitting: General Surgery

## 2011-06-11 LAB — DIFFERENTIAL
Basophils Absolute: 0.1 10*3/uL (ref 0.0–0.1)
Basophils Relative: 1 % (ref 0–1)
Eosinophils Absolute: 0.3 10*3/uL (ref 0.0–0.7)
Eosinophils Relative: 4 % (ref 0–5)
Lymphocytes Relative: 18 % (ref 12–46)
Lymphs Abs: 1.4 10*3/uL (ref 0.7–4.0)
Monocytes Absolute: 0.5 10*3/uL (ref 0.1–1.0)
Monocytes Relative: 7 % (ref 3–12)
Neutro Abs: 5.4 10*3/uL (ref 1.7–7.7)
Neutrophils Relative %: 71 % (ref 43–77)

## 2011-06-11 LAB — URINALYSIS, ROUTINE W REFLEX MICROSCOPIC
Bilirubin Urine: NEGATIVE
Glucose, UA: NEGATIVE mg/dL
Hgb urine dipstick: NEGATIVE
Ketones, ur: NEGATIVE mg/dL
Leukocytes, UA: NEGATIVE
Nitrite: NEGATIVE
Protein, ur: NEGATIVE mg/dL
Specific Gravity, Urine: 1.004 — ABNORMAL LOW (ref 1.005–1.030)
Urobilinogen, UA: 0.2 mg/dL (ref 0.0–1.0)
pH: 5.5 (ref 5.0–8.0)

## 2011-06-11 LAB — COMPREHENSIVE METABOLIC PANEL
ALT: 25 U/L (ref 0–35)
AST: 31 U/L (ref 0–37)
Albumin: 3.7 g/dL (ref 3.5–5.2)
Alkaline Phosphatase: 60 U/L (ref 39–117)
BUN: 13 mg/dL (ref 6–23)
CO2: 27 mEq/L (ref 19–32)
Calcium: 9.5 mg/dL (ref 8.4–10.5)
Chloride: 98 mEq/L (ref 96–112)
Creatinine, Ser: 0.62 mg/dL (ref 0.50–1.10)
GFR calc Af Amer: >90 mL/min (ref >90)
GFR calc non Af Amer: >90 mL/min (ref >90)
Glucose, Bld: 226 mg/dL — ABNORMAL HIGH (ref 70–99)
Potassium: 3.9 mEq/L (ref 3.5–5.1)
Sodium: 134 mEq/L — ABNORMAL LOW (ref 135–145)
Total Bilirubin: 0.2 mg/dL — ABNORMAL LOW (ref 0.3–1.2)
Total Protein: 7 g/dL (ref 6.0–8.3)

## 2011-06-11 LAB — CBC
HCT: 34.5 % — ABNORMAL LOW (ref 36.0–46.0)
Hemoglobin: 11.9 g/dL — ABNORMAL LOW (ref 12.0–15.0)
MCH: 29.9 pg (ref 26.0–34.0)
MCHC: 34.5 g/dL (ref 30.0–36.0)
MCV: 86.7 fL (ref 78.0–100.0)
Platelets: 241 10*3/uL (ref 150–400)
RBC: 3.98 MIL/uL (ref 3.87–5.11)
RDW: 12.6 % (ref 11.5–15.5)
WBC: 7.7 10*3/uL (ref 4.0–10.5)

## 2011-06-11 LAB — SURGICAL PCR SCREEN
MRSA, PCR: NEGATIVE
Staphylococcus aureus: NEGATIVE

## 2011-06-13 ENCOUNTER — Telehealth: Payer: Self-pay | Admitting: *Deleted

## 2011-06-13 DIAGNOSIS — E785 Hyperlipidemia, unspecified: Secondary | ICD-10-CM

## 2011-06-13 DIAGNOSIS — IMO0001 Reserved for inherently not codable concepts without codable children: Secondary | ICD-10-CM

## 2011-06-13 NOTE — Telephone Encounter (Signed)
Patient states that her Surgery is Friday morning, 06/15/11. What does she need to do at this point.? Please call patient on mobile at [phone]

## 2011-06-13 NOTE — Telephone Encounter (Signed)
Per verbal order Dr Alwyn Ren, patient will have STAT labs at 11:00am & be seen OV at 12:00pm per patient request for these appt times Thurs, 06/14/11 prior to Sx on Fri 06/15/11.

## 2011-06-13 NOTE — Telephone Encounter (Signed)
Pt is schedule to have surgery in November. Pt labs were abnormal please address labs and f/u with Pt or Dr Derrell Lolling in regard to labs. .Please advise labs 06-11-11

## 2011-06-13 NOTE — Telephone Encounter (Signed)
I recommend an A1c to assess diabetes status as well as a TSH(diagnoses 250.00, 272.4) office visit 2 days after A1c

## 2011-06-13 NOTE — Telephone Encounter (Signed)
A1c and TSH Thursday 11/1; office visit 11/1  @ 4: 30. Her diabetes appears to be uncontrolled which would be a risk factor with surgery.

## 2011-06-14 ENCOUNTER — Ambulatory Visit (INDEPENDENT_AMBULATORY_CARE_PROVIDER_SITE_OTHER): Payer: BC Managed Care – PPO | Admitting: Internal Medicine

## 2011-06-14 ENCOUNTER — Other Ambulatory Visit: Payer: BC Managed Care – PPO

## 2011-06-14 DIAGNOSIS — J45909 Unspecified asthma, uncomplicated: Secondary | ICD-10-CM

## 2011-06-14 DIAGNOSIS — E119 Type 2 diabetes mellitus without complications: Secondary | ICD-10-CM

## 2011-06-14 DIAGNOSIS — D649 Anemia, unspecified: Secondary | ICD-10-CM

## 2011-06-14 LAB — TSH: TSH: 1.45 u[IU]/mL (ref 0.35–5.50)

## 2011-06-14 LAB — HEMOGLOBIN A1C: Hgb A1c MFr Bld: 6.5 % (ref 4.6–6.5)

## 2011-06-14 NOTE — Progress Notes (Signed)
Subjective:    Patient ID: [name], female    DOB: 08/28/1950, 60 y.o.   MRN: [id number]  HPI  Pre op evaluation: Surgical diagnosis:Laparoscopic Cholecystectomy Tentative surgical date/Surgeon: Dr Derrell Lolling; 11/2 @ 1:15 pm Severity:intermittent RUQ pain to 10 for up to an hour Pain: sharp with diffuse radiation Activity of daily living limitation/impairment of function: pain takes 4 hrs to resolve Treatment to date, efficacy: Past medical history: Dr. Allyson Sabal performed a catheterization last month which is unchanged a history.    Review of Systems Diabetes status assessment: The preop labs done 10/29 were nonfasting and apparently. Her glucose was 226 and sodium 134. Fasting or morning glucose range:  Not checked regularly it was 144 this am . Two hours after supper last night glucose was 173. Hypoglycemia :  no .                                                     Excess thirst :no;  Excess hunger:  no ;  Excess urination: possibly but she drinks water in large amounts. Note: no dysuria, pyuria, or hematuria                                  Lightheadedness with standing:  no. Chest pain:  no ; Palpitations :no ;  Pain in  calves with walking:  no .                                                                                                                                 Non healing skin  ulcers or sores,especially over the feet:  no. Numbness or tingling or burning in feet : no .                                                                                                                                              Significant change in  Weight :decreasing with diet change  . Vision changes : no  .  Nutrition/diet:  Increased fruit, less carbs. Medication compliance : yes. Medication adverse  Effects:  no . Eye exam : 6/12, no retinopathy. Foot care : no.  A1c/ urine microalbumin monitor:  pending      Hematocrit was 34.5; it is varied from 31.5 to high of 36.9 over the last 3 years. She denies severe dyspepsia, melena, or rectal bleeding. Her last colonoscopy was at age 52; this was negative. Followup is not due for 10 years from that date. She had esophageal dilation at that time as well by Dr. Matthias Hughs.  She has had a recent flare of her asthma requiring that she use the Symbicort    Objective:   Physical Exam Gen.: well-nourished, appropriate and alert, weight Eyes: No lid/conjunctival changes Neck: thyroid normal ( Note : PMH of goiter) Respiratory: No increased work of breathing ; she is very low-grade isolated wheezing and he Cardiac : regular rhythm, no extra heart sounds, gallop, grade 1.5 basilar systolic murmur with neck radiation, right greater than left Abdomen: No organomegaly ,masses, bruits or aortic enlargement; slightly tender right upper quadrant to palpation Lymph: No lymphadenopathy of the neck or axilla Skin: No rashes, lesions, ulcers or ischemic changes Muscle skeletal: Absent great toenails Vasc:All pulses intact, no bruits present  But see carotid radiation of murmur  above Neuro: Normal deep tendon reflexes, alert & oriented, sensation over feet normal to light touch Psych: judgment and insight, mood and affect normal         Assessment & Plan:  #1 diabetes; the worrisome glucose of 226 was nonfasting. By her home records the diabetic control was adequate  #2 asthma, mild exacerbation. She should continue nebulized therapy and hospital on an as needed basis  #3 anemia, chronic and essentially stable  #4 mild hyponatremia most likely related to the elevated glucose.  #5 coronary disease, stable as per her cardiologist  Plan: Sliding scale insulin should be employed in the hospital as needed. Excessively tight control should be avoided. Fasting blood sugar goals should be 150-200.

## 2011-06-14 NOTE — Progress Notes (Signed)
Addended by: Legrand Como on: 06/14/2011 01:42 PM   Modules accepted: Orders

## 2011-06-14 NOTE — Patient Instructions (Signed)
Eat a low-fat diet with lots of fruits and vegetables, up to 7-9 servings per day. Avoid obesity; your goal is waist measurement < 40 inches.Consume less than 40 grams of sugar per day from foods & drinks with High Fructose Corn Sugar as #1,2,3 or # 4 on label. Follow the low carb nutrition program in [employer] as closely as possible to prevent Diabetes progression & complications. White carbohydrates (potatoes, rice, bread, and pasta) have a high spike of sugar and a high load of sugar. For example a  baked potato has a cup of sugar and a  french fry  2 teaspoons of sugar. Yams, wild  rice, whole grained bread &  wheat pasta have been much lower spike and load of  sugar. Portions should be the size of a deck of cards or your palm.  Please  schedule A1c every 4 months if A1c is 6.5 % or higher; every 46 mos if < 6.5%.

## 2011-06-14 NOTE — Progress Notes (Signed)
12  

## 2011-06-15 ENCOUNTER — Ambulatory Visit (HOSPITAL_COMMUNITY)
Admission: RE | Admit: 2011-06-15 | Discharge: 2011-06-16 | Disposition: A | Discharge status: Home / Self Care | Payer: BC Managed Care – PPO | Source: Ambulatory Visit | Attending: General Surgery | Admitting: General Surgery

## 2011-06-15 ENCOUNTER — Other Ambulatory Visit (INDEPENDENT_AMBULATORY_CARE_PROVIDER_SITE_OTHER): Payer: Self-pay | Admitting: General Surgery

## 2011-06-15 DIAGNOSIS — Z87891 Personal history of nicotine dependence: Secondary | ICD-10-CM | POA: Insufficient documentation

## 2011-06-15 DIAGNOSIS — I2510 Atherosclerotic heart disease of native coronary artery without angina pectoris: Secondary | ICD-10-CM | POA: Insufficient documentation

## 2011-06-15 DIAGNOSIS — K219 Gastro-esophageal reflux disease without esophagitis: Secondary | ICD-10-CM | POA: Insufficient documentation

## 2011-06-15 DIAGNOSIS — E119 Type 2 diabetes mellitus without complications: Secondary | ICD-10-CM | POA: Insufficient documentation

## 2011-06-15 DIAGNOSIS — K8010 Calculus of gallbladder with chronic cholecystitis without obstruction: Secondary | ICD-10-CM | POA: Insufficient documentation

## 2011-06-15 DIAGNOSIS — E785 Hyperlipidemia, unspecified: Secondary | ICD-10-CM | POA: Insufficient documentation

## 2011-06-15 DIAGNOSIS — J45909 Unspecified asthma, uncomplicated: Secondary | ICD-10-CM | POA: Insufficient documentation

## 2011-06-15 DIAGNOSIS — Z01812 Encounter for preprocedural laboratory examination: Secondary | ICD-10-CM | POA: Insufficient documentation

## 2011-06-15 DIAGNOSIS — I10 Essential (primary) hypertension: Secondary | ICD-10-CM | POA: Insufficient documentation

## 2011-06-15 HISTORY — PX: LAPAROSCOPIC CHOLECYSTECTOMY: SUR755

## 2011-06-15 LAB — GLUCOSE, CAPILLARY
Glucose-Capillary: 172 mg/dL — ABNORMAL HIGH (ref 70–99)
Glucose-Capillary: 161 mg/dL — ABNORMAL HIGH (ref 70–99)
Glucose-Capillary: 123 mg/dL — ABNORMAL HIGH (ref 70–99)

## 2011-06-15 MED ORDER — ONDANSETRON HCL 4 MG/2ML IJ SOLN: 4.0000 mg | Freq: Four times a day (QID) | INTRAMUSCULAR | Status: DC | PRN

## 2011-06-15 MED ORDER — HEPARIN SODIUM (PORCINE) 5000 UNIT/ML IJ SOLN
5000.0000 [IU] | Freq: Three times a day (TID) | INTRAMUSCULAR | Status: DC
  Filled 2011-06-15 (×8): qty 1

## 2011-06-15 MED ORDER — SITAGLIPTIN PHOSPHATE 25 MG PO TABS
25.0000 mg | ORAL_TABLET | Freq: Every day | ORAL | Status: DC
  Filled 2011-06-15 (×2): qty 1

## 2011-06-15 MED ORDER — BIMATOPROST 0.03 % OP SOLN: 1.0000 [drp] | Freq: Every day | OPHTHALMIC | Status: DC

## 2011-06-15 MED ORDER — MORPHINE SULFATE 2 MG/ML IJ SOLN: 1.0000 mg | INTRAMUSCULAR | Status: DC | PRN

## 2011-06-15 MED ORDER — AMLODIPINE BESYLATE 10 MG PO TABS
10.0000 mg | ORAL_TABLET | Freq: Every day | ORAL | Status: DC
  Filled 2011-06-15 (×2): qty 1

## 2011-06-15 MED ORDER — METFORMIN HCL 500 MG PO TABS
1000.0000 mg | ORAL_TABLET | Freq: Every day | ORAL | Status: DC
  Filled 2011-06-15 (×2): qty 2

## 2011-06-15 MED ORDER — POTASSIUM CHLORIDE IN NACL 20-0.9 MEQ/L-% IV SOLN
INTRAVENOUS | Status: DC
  Filled 2011-06-15 (×5): qty 1000

## 2011-06-15 MED ORDER — METFORMIN HCL 500 MG PO TABS
500.0000 mg | ORAL_TABLET | Freq: Every day | ORAL | Status: DC
  Filled 2011-06-15 (×2): qty 1

## 2011-06-15 MED ORDER — OXYCODONE-ACETAMINOPHEN 5-325 MG PO TABS: 1.0000 | ORAL_TABLET | ORAL | Status: DC | PRN

## 2011-06-15 MED ORDER — ISOSORBIDE MONONITRATE 15 MG HALF TABLET
15.0000 mg | ORAL_TABLET | Freq: Every day | ORAL | Status: DC
  Filled 2011-06-15 (×2): qty 1

## 2011-06-15 MED ORDER — ATENOLOL 50 MG PO TABS
50.0000 mg | ORAL_TABLET | Freq: Every day | ORAL | Status: DC
  Filled 2011-06-15 (×2): qty 1

## 2011-06-15 MED ORDER — PANTOPRAZOLE SODIUM 40 MG PO TBEC: 40.0000 mg | DELAYED_RELEASE_TABLET | Freq: Every day | ORAL | Status: DC

## 2011-06-15 MED ORDER — DM-GUAIFENESIN ER 30-600 MG PO TB12
1.0000 | ORAL_TABLET | Freq: Two times a day (BID) | ORAL | Status: DC
  Filled 2011-06-15 (×5): qty 1

## 2011-06-15 MED ORDER — CELECOXIB 200 MG PO CAPS
200.0000 mg | ORAL_CAPSULE | Freq: Two times a day (BID) | ORAL | Status: DC
  Filled 2011-06-15 (×5): qty 1

## 2011-06-15 MED ORDER — ESTRADIOL 1 MG PO TABS
0.5000 mg | ORAL_TABLET | Freq: Every day | ORAL | Status: DC
  Filled 2011-06-15 (×3): qty 0.5

## 2011-06-15 MED ORDER — PAROXETINE HCL 20 MG PO TABS
25.0000 mg | ORAL_TABLET | Freq: Every day | ORAL | Status: DC
  Filled 2011-06-15 (×2): qty 0.5

## 2011-06-15 MED ORDER — INSULIN ASPART 100 UNIT/ML ~~LOC~~ SOLN: 0.0000 [IU] | SUBCUTANEOUS | Status: DC

## 2011-06-15 MED ORDER — BUDESONIDE-FORMOTEROL FUMARATE 160-4.5 MCG/ACT IN AERO: 2.0000 | INHALATION_SPRAY | Freq: Two times a day (BID) | RESPIRATORY_TRACT | Status: DC | PRN

## 2011-06-15 MED ORDER — FLUTICASONE PROPIONATE HFA 44 MCG/ACT IN AERO: 3.0000 | INHALATION_SPRAY | Freq: Four times a day (QID) | RESPIRATORY_TRACT | Status: DC | PRN

## 2011-06-15 MED ORDER — GLIMEPIRIDE 2 MG PO TABS
2.0000 mg | ORAL_TABLET | Freq: Every day | ORAL | Status: DC
  Filled 2011-06-15 (×2): qty 1

## 2011-06-15 MED ORDER — ALBUTEROL SULFATE HFA 108 (90 BASE) MCG/ACT IN AERS: 2.0000 | INHALATION_SPRAY | Freq: Four times a day (QID) | RESPIRATORY_TRACT | Status: DC | PRN

## 2011-06-15 MED ORDER — LISINOPRIL 40 MG PO TABS
40.0000 mg | ORAL_TABLET | Freq: Every day | ORAL | Status: DC
  Filled 2011-06-15 (×3): qty 1

## 2011-06-15 MED ORDER — SITAGLIPTIN PHOSPHATE 50 MG PO TABS
50.0000 mg | ORAL_TABLET | Freq: Every day | ORAL | Status: DC
  Filled 2011-06-15 (×2): qty 1

## 2011-06-16 LAB — GLUCOSE, CAPILLARY
Glucose-Capillary: 162 mg/dL — ABNORMAL HIGH (ref 70–99)
Glucose-Capillary: 188 mg/dL — ABNORMAL HIGH (ref 70–99)

## 2011-06-16 MED ORDER — BIOTENE DRY MOUTH MT LIQD: 15.0000 mL | Freq: Two times a day (BID) | OROMUCOSAL | Status: DC

## 2011-06-17 NOTE — Op Note (Signed)
NAMEAUTUM, BENFER NO.:  [id number]  MEDICAL RECORD NO.:  [id number]  LOCATION:  1525                         FACILITY:  [facility]  PHYSICIAN:  Angelia Mould. Derrell Lolling, M.D.DATE OF BIRTH:  1950/11/01  DATE OF PROCEDURE:  06/15/2011 DATE OF DISCHARGE:                              OPERATIVE REPORT   PREOPERATIVE DIAGNOSIS:  Chronic cholecystitis with cholelithiasis.  POSTOPERATIVE DIAGNOSIS:  Chronic cholecystitis with cholelithiasis.  OPERATION PERFORMED:  Laparoscopic cholecystectomy.  SURGEON:  Angelia Mould. Derrell Lolling, M.D.  FIRST ASSISTANT:  Ardeth Sportsman, MD  OPERATIVE INDICATIONS:  This is a 60 year old female with multiple medical problems including non-insulin-dependent diabetes, morbid obesity, hypertension, hyperlipidemia, coronary artery disease, asthma, history of pneumonia, and history of tobacco abuse, but abstinent from 18 years.  Without background history, she has been developed some episodes of the right sided and epigastric pain radiating up to the chest.  She had a cardiac evaluation, which was remarkable.  She has had nausea, but no vomiting.  An ultrasound was recently done, which showed sludge and multiple tiny gallstones, but no inflammatory change.  The common bile duct was normal.  There was fatty infiltration of the liver since suspected.  Liver function tests were normal.  CBC was normal except for anemia with a hemoglobin of 11.9, which was chronic.  She was counseled as an outpatient.  She was brought to the operating room electively.  OPERATIVE FINDINGS:  The gallbladder was thin-walled.  There were extensive adhesions to the gallbladder.  It was significantly intrahepatic.  The liver was huge and quite heavy, consistent with fatty infiltration of the liver.  She had a few adhesions in the lower midline from her previous pelvic surgery.  The gallbladder wall was thin and friable and tore easily.  The gallbladder contained numerous  tiny gallstones that were very dark in color.  OPERATIVE TECHNIQUE:  Following the induction of general endotracheal anesthesia, the patient's abdomen was prepped and draped in sterile fashion.  Intravenous antibiotics were given.  Surgical time-out was held identifying correct patient and correct procedure.  0.5% Marcaine with epinephrine was used as a local infiltration anesthetic.  A vertically oriented incision was made at the superior rim of the umbilicus.  The fascia was incised in the midline and the abdominal cavity entered under direct vision.  An 11 mm trocar was placed in the subxiphoid region and two 5 mm trocars were placed in the right upper quadrant.  We were able to elevate the fundus of the gallbladder.  The fundus did tear, we did spill some bile and a few small stones, which were retrieved.  We slowly took all the adhesions down off the gallbladder.  We got down low onto the neck of the gallbladder.  We had to go very slowly because it was tedious and partially intrahepatic.  We slowly created a window behind the entire lower neck of the gallbladder. We had a nice critical view at that point.  We isolated the posterior branch of the cystic artery and secured it with metal clips and divided it.  We isolated the narrowing and infundibulum of the gallbladder.  One hole was made in the infundibulum, the  spilling some stones, which were retrieved.  We ultimately placed multiple metal clips on the cystic duct- infundibulum junction and divided this.  We also placed a PDS Endoloop on the cystic duct stump.  Gallbladder was then dissected from its bed with electrocautery, placed in a specimen bag and removed.  We spent a good deal of time in irrigating the subhepatic space, subphrenic space, and right paracolic gutter to make sure there was no stones left and no bleeding and no bile.  Things looked good in the bed of the gallbladder.  I was a little bit concerned about the  cystic duct stump and so I placed a 19-French Blake drain in the subhepatic space and brought out through one of the right upper quadrant trocar sites, I connected it to a suction bulb.  After all the irrigation fluid was removed and then I was satisfied with hemostasis in the absence of bile leak, I released the pneumoperitoneum and took all the trocars out.  The fascia and the umbilicus were closed with 0 Vicryl sutures and the skin closed with subcuticular sutures of 4-0 Monocryl and Dermabond.  The was patient taken to the recovery room in stable condition.  ESTIMATED BLOOD LOSS:  Was about 20 cc.  COMPLICATIONS:  None.  Sponge, needle, and instrument counts were correct.     Angelia Mould. Derrell Lolling, M.D.     HMI/MEDQ  D:  06/15/2011  T:  06/16/2011  Job:  [id number]  cc:   Titus Dubin. Alwyn Ren, MD,FACP,FCCP [phone] [place], [place] [address]  Nanetta Batty, M.D. Fax: [phone]  Electronically Signed by Claud Kelp M.D. on 06/17/2011 10:52:39 PM

## 2011-06-18 ENCOUNTER — Ambulatory Visit (INDEPENDENT_AMBULATORY_CARE_PROVIDER_SITE_OTHER): Payer: BC Managed Care – PPO | Admitting: Internal Medicine

## 2011-06-18 ENCOUNTER — Telehealth (INDEPENDENT_AMBULATORY_CARE_PROVIDER_SITE_OTHER): Payer: Self-pay

## 2011-06-18 ENCOUNTER — Encounter: Payer: Self-pay | Admitting: Internal Medicine

## 2011-06-18 DIAGNOSIS — R509 Fever, unspecified: Secondary | ICD-10-CM

## 2011-06-18 DIAGNOSIS — J45901 Unspecified asthma with (acute) exacerbation: Secondary | ICD-10-CM

## 2011-06-18 DIAGNOSIS — R6883 Chills (without fever): Secondary | ICD-10-CM

## 2011-06-18 MED ORDER — CEFUROXIME AXETIL 500 MG PO TABS: 500.0000 mg | ORAL_TABLET | Freq: Two times a day (BID) | ORAL | Status: DC

## 2011-06-18 NOTE — Progress Notes (Signed)
  Subjective:    Patient ID: [name], female    DOB: [birth year], 60 y.o.   MRN: [id number]  HPI Asthma flare: Cough:no Sputum production:no Hemoptysis:no Dyspnea (rest/exertional/PND):only with ambulation; minor rest dyspnea Wheezing:yes Chest pain, edema, palpitations:R pleuriitic Treatment/efficacy:rescue inhaler twice a day ; Symbicort twice a day   Review of Systems she has had rigor & chills and temperatures up to 100.2. She denies sweats     Objective:   Physical Exam    She is in no acute distress; she has no increased work of breathing.  Skin is warm and dry.  She has no lymphadenopathy about the neck or axilla.  Chest low S4 with no gallop; faint systolic murmur present   She does have low-grade rales at both bases. No rub present  Bowel sounds are decreased; clinically there is no ileus. Drain present on R        Assessment & Plan:  #1 asthma flare  #2 rigor, chills, and fever  #3 status post laparoscopic cholecystectomy 11/2  Plan: #1 increase albuterol to 1-2 puffs every 4 hours as needed. Continue the Symbicort 2 puffs every 12 hours.  #2 generic Ceftin 500  mg ,dispense 14

## 2011-06-18 NOTE — Patient Instructions (Signed)
Employ either the nebulized albuterol or one to 2 puffs of the metered-dose inhaler every 4 hours as needed.

## 2011-06-18 NOTE — Telephone Encounter (Signed)
Pts caregiver called. Pt having temps of 100.2 with difficulty catching breath at times. Pt has hx of asthma and pneumonia. States pt was advised by Dr Alwyn Ren that she have some asthma pre op. Drain fluid not green, wounds look good. They have call into Dr [name]'s office re: lung issues. Reviewed with Dr Derrell Lolling. He advises that pt should see Dr Alwyn Ren today to eval for lung problems. Caregiver to call if any redness of wounds, drainage change to green, nausea,vomiting or other concerns call our office asap. She will call if they have any difficulty seeing Dr Alwyn Ren today.

## 2011-06-18 NOTE — Telephone Encounter (Signed)
Pt called, given po appt for 11-8 due to need for drain ck.

## 2011-06-21 ENCOUNTER — Encounter (INDEPENDENT_AMBULATORY_CARE_PROVIDER_SITE_OTHER): Payer: Self-pay | Admitting: General Surgery

## 2011-06-21 ENCOUNTER — Ambulatory Visit (INDEPENDENT_AMBULATORY_CARE_PROVIDER_SITE_OTHER): Payer: BC Managed Care – PPO | Admitting: General Surgery

## 2011-06-21 VITALS — BP 132/48 | HR 68 | Temp 97.8°F | Resp 16 | Ht 62.0 in | Wt 212.8 lb

## 2011-06-21 DIAGNOSIS — Z9889 Other specified postprocedural states: Secondary | ICD-10-CM

## 2011-06-21 NOTE — Progress Notes (Signed)
Subjective:     Patient ID: [name], female   DOB: April 03, 1951, 60 y.o.   MRN: [id number]  HPI This patient underwent laparoscopic cholecystectomy on November 2. The liver was huge. The gallbladder was intrahepatic. The gallbladder was friable and tore easily. I left a drain in case she had a bile leak.  Postop she has done fine from a GI standpoint with resumption of diet and BM's.. She did develop another episode of bronchitis and saw Dr. Alwyn Ren and was placed on antibiotics. That is improving.  The drainage has been serous.  Review of Systems     Objective:   Physical Exam Constitutional:    patient looks somewhat fatigued but is in no distress. She is obese. Temp 97.8. Heart rate 68. Respirations 16.  Abdomen:    obese soft minimally tender. Wounds are healing well. Jackson-Pratt drainage is thin and yellow and transparent. The drain was removed.    Assessment:     Chronic cholecystitis with cholelithiasis, recovering without apparent intra-abdominal complications in  the early postop period  Recent episode of bronchitis, improving  Morbid obesity  Multiple medical problems.    Plan:     Advance diet and activity as tolerated.  I offered to see her back in 2 weeks or to see her p.r.n. if she did not recover. It was her preference to see me p.r.n. if she developed any complications.  To call me if she develops any fever, increasing abdominal pain, nausea or vomiting.

## 2011-06-21 NOTE — Patient Instructions (Signed)
You are making good progress postop. Your drain was removed today. Please try to increase your activity as much as tolerated. You should slowly recover from the surgery over the next 10-14 days. Return to see me if there are any problems.

## 2011-06-23 ENCOUNTER — Other Ambulatory Visit: Payer: Self-pay | Admitting: Internal Medicine

## 2011-07-23 ENCOUNTER — Other Ambulatory Visit: Payer: Self-pay | Admitting: Internal Medicine

## 2011-07-23 MED ORDER — GLIMEPIRIDE 4 MG PO TABS: 2.0000 mg | ORAL_TABLET | Freq: Every day | ORAL | Status: DC

## 2011-07-23 NOTE — Telephone Encounter (Signed)
RX sent

## 2011-08-27 ENCOUNTER — Other Ambulatory Visit: Payer: Self-pay | Admitting: Obstetrics and Gynecology

## 2011-08-27 DIAGNOSIS — E049 Nontoxic goiter, unspecified: Secondary | ICD-10-CM

## 2011-09-14 ENCOUNTER — Other Ambulatory Visit: Payer: BC Managed Care – PPO

## 2011-09-24 ENCOUNTER — Other Ambulatory Visit: Payer: Self-pay | Admitting: Internal Medicine

## 2011-09-28 ENCOUNTER — Other Ambulatory Visit: Payer: Self-pay | Admitting: Internal Medicine

## 2011-10-02 NOTE — Telephone Encounter (Signed)
Left message on voicemail for patient to return call when available, ? Instruction on med (med list has no instruction)

## 2011-10-03 ENCOUNTER — Other Ambulatory Visit: Payer: Self-pay | Admitting: Internal Medicine

## 2011-10-05 ENCOUNTER — Ambulatory Visit (INDEPENDENT_AMBULATORY_CARE_PROVIDER_SITE_OTHER): Payer: BC Managed Care – PPO | Admitting: Family Medicine

## 2011-10-05 ENCOUNTER — Encounter: Payer: Self-pay | Admitting: Family Medicine

## 2011-10-05 DIAGNOSIS — R509 Fever, unspecified: Secondary | ICD-10-CM

## 2011-10-05 DIAGNOSIS — R6883 Chills (without fever): Secondary | ICD-10-CM

## 2011-10-05 DIAGNOSIS — J209 Acute bronchitis, unspecified: Secondary | ICD-10-CM

## 2011-10-05 DIAGNOSIS — J45909 Unspecified asthma, uncomplicated: Secondary | ICD-10-CM

## 2011-10-05 DIAGNOSIS — J45901 Unspecified asthma with (acute) exacerbation: Secondary | ICD-10-CM

## 2011-10-05 MED ORDER — GLUCOSE BLOOD VI STRP: ORAL_STRIP | Status: AC

## 2011-10-05 MED ORDER — ONETOUCH DELICA LANCETS FINE MISC: 1.0000 [IU] | Status: DC

## 2011-10-05 MED ORDER — CEFUROXIME AXETIL 500 MG PO TABS: 500.0000 mg | ORAL_TABLET | Freq: Two times a day (BID) | ORAL | Status: AC

## 2011-10-05 NOTE — Assessment & Plan Note (Signed)
Pt w/out wheezing or crackles on PE.  Given underlying lung dz and increased SOB will start abx.  No respiratory distress.  Reviewed supportive care and red flags that should prompt return.  Pt expressed understanding and is in agreement w/ plan.

## 2011-10-05 NOTE — Progress Notes (Signed)
  Subjective:    Patient ID: [name], female    DOB: 1951/07/07, 61 y.o.   MRN: [id number]  HPI Asthma- sxs started 1 week ago, 'maybe 2'.  Having chest tightness w/ exertion.  + HA- occipital initially and then travelled forward.  Having frontal sinus pain, deeper voice.  No sore throat.  + dry cough.  No fevers.  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  No ear pain.  Using Symbicort daily, using Albuterol daily- 'it makes me nuts'.  DM- pt left monitor on cruise ship.  Needs new monitor and supplies.   Review of Systems For ROS see HPI     Objective:   Physical Exam  Vitals reviewed. Constitutional: She appears well-developed and well-nourished. No distress.  HENT:  Head: Normocephalic and atraumatic.       TMs normal bilaterally Mild nasal congestion Throat w/out erythema, edema, or exudate Mild TTP over frontal sinuses  Eyes: Conjunctivae and EOM are normal. Pupils are equal, round, and reactive to light.  Neck: Normal range of motion. Neck supple.  Cardiovascular: Normal rate, regular rhythm, normal heart sounds and intact distal pulses.   No murmur heard. Pulmonary/Chest: Effort normal and breath sounds normal. No respiratory distress. She has no wheezes.       + hacking cough  Lymphadenopathy:    She has no cervical adenopathy.          Assessment & Plan:

## 2011-10-05 NOTE — Patient Instructions (Signed)
This appears to be a bronchitis/sinusitis Start the Ceftin as directed- take w/ food to avoid upset stomach Continue your mucinex and inhalers If your symptoms change, worsen, or don't improve- please call cardiology or go to the ER Call with any questions or concerns Hang in there!

## 2011-10-12 ENCOUNTER — Ambulatory Visit (INDEPENDENT_AMBULATORY_CARE_PROVIDER_SITE_OTHER): Payer: BC Managed Care – PPO | Admitting: Family Medicine

## 2011-10-12 VITALS — BP 151/68 | HR 60 | Resp 18 | Ht 61.0 in | Wt 208.0 lb

## 2011-10-12 DIAGNOSIS — G44209 Tension-type headache, unspecified, not intractable: Secondary | ICD-10-CM

## 2011-10-12 MED ORDER — CYCLOBENZAPRINE HCL 10 MG PO TABS: 10.0000 mg | ORAL_TABLET | Freq: Every evening | ORAL | Status: AC | PRN

## 2011-10-12 NOTE — Progress Notes (Signed)
  Subjective:    Patient ID: [name], female    DOB: [birth year], 61 y.o.   MRN: [id number]  HPI Patient presents with several concerns  !) Patient developed cramp in (L) calf earlier today and then leg became extremely cold. Leg/calf symptoms have resolved. Denies leg swelling.  Denies SOB over baseline, chest pain or palpitaions. No history of diabetic neuropathy; last A1C 5.3  2) Headache "its like I have a headband on" .  Headache off and on for 3 weeks. Denies nausea, or photo/phonosensitivity.  Stressors with 31 year old daughter  3) Chest burning- pain with deep inspiration; recent URI  Catherization 10/12- no blockages   Lap cholecystectomy-11/12   Review of Systems     Objective:   Physical Exam  Constitutional: She appears well-developed and well-nourished.  HENT:  Head: Normocephalic and atraumatic.  Right Ear: Tympanic membrane is retracted.  Left Ear: Tympanic membrane is retracted.  Nose: Mucosal edema present.  Neck: Neck supple.  Cardiovascular: Normal rate, regular rhythm and normal heart sounds.   Pulmonary/Chest: Effort normal and breath sounds normal.  Neurological: She is alert.  Skin: Skin is warm and dry.  Legs- (B) gastrocnemius without cords; DP 2+ ; No LE lesions or neuropathic changes    Large varicocites    Assessment & Plan:  Type 2 DM Tension headache URI; pleuritic symptoms Leg cramping; no evidence of venous or arterial clot  Patient to follow up with Dr. Allyson Sabal this week Flexeril 10mg  qhs Call over the weekend should  any concerns arise Anticipatory guidance

## 2011-12-25 ENCOUNTER — Other Ambulatory Visit: Payer: Self-pay | Admitting: Internal Medicine

## 2012-03-24 ENCOUNTER — Telehealth: Payer: Self-pay | Admitting: Internal Medicine

## 2012-03-24 MED ORDER — ATENOLOL 50 MG PO TABS: ORAL_TABLET | ORAL | Status: DC

## 2012-03-24 MED ORDER — SITAGLIPTIN PHOS-METFORMIN HCL 50-1000 MG PO TABS: ORAL_TABLET | ORAL | Status: DC

## 2012-03-24 MED ORDER — BUDESONIDE-FORMOTEROL FUMARATE 160-4.5 MCG/ACT IN AERO: 2.0000 | INHALATION_SPRAY | Freq: Two times a day (BID) | RESPIRATORY_TRACT | Status: DC | PRN

## 2012-03-24 NOTE — Telephone Encounter (Signed)
Refill: Atenolol 50mg  tablet. Take 1 tablet once daily. Qty 30. Last fill 02-22-12.

## 2012-03-24 NOTE — Telephone Encounter (Signed)
Refill  JANUMET 50-1000 MG TAKE 1/2 TABLET IN THE MORNING AND 1 TABLET IN THE EVENING. #45 Last fill 7.12.13 Last ov 2.22.13 acute wt/tabori Last seen [name] 11.5.12 acute

## 2012-03-24 NOTE — Telephone Encounter (Signed)
3 rx's sent to [facility]

## 2012-03-24 NOTE — Telephone Encounter (Signed)
Refill: Symbicort 160-4.5 mcg inh. Use 1-2 puffs every 12 hours. Gargle after use. Qty 10.2gm. Last fill 11-27-11

## 2012-03-31 ENCOUNTER — Other Ambulatory Visit: Payer: Self-pay | Admitting: Internal Medicine

## 2012-03-31 MED ORDER — AMLODIPINE BESYLATE 10 MG PO TABS: ORAL_TABLET | ORAL | Status: DC

## 2012-03-31 NOTE — Telephone Encounter (Signed)
AMLODIPINE BESYLATE 10 MG LAST REFILL: 02/22/12 QTY: 30 TABLET TAKE 1 TABLET FOR HIGH BLOOD PRESSURE.

## 2012-03-31 NOTE — Telephone Encounter (Signed)
RX sent

## 2012-04-20 ENCOUNTER — Other Ambulatory Visit: Payer: Self-pay | Admitting: Internal Medicine

## 2012-04-26 ENCOUNTER — Other Ambulatory Visit: Payer: Self-pay | Admitting: Internal Medicine

## 2012-04-29 ENCOUNTER — Telehealth: Payer: Self-pay | Admitting: Internal Medicine

## 2012-04-29 NOTE — Telephone Encounter (Signed)
Lm on triage line at 125pm wants a return call back at [phone]

## 2012-04-30 NOTE — Telephone Encounter (Signed)
Patient was calling to see if Accupril was filled. RX was responded to

## 2012-04-30 NOTE — Telephone Encounter (Signed)
Left msg for pt to call. 

## 2012-05-05 ENCOUNTER — Encounter: Payer: Self-pay | Admitting: Internal Medicine

## 2012-05-05 ENCOUNTER — Ambulatory Visit (INDEPENDENT_AMBULATORY_CARE_PROVIDER_SITE_OTHER): Payer: BC Managed Care – PPO | Admitting: Internal Medicine

## 2012-05-05 VITALS — BP 138/78 | HR 62 | Temp 97.9°F | Wt 210.6 lb

## 2012-05-05 DIAGNOSIS — L259 Unspecified contact dermatitis, unspecified cause: Secondary | ICD-10-CM

## 2012-05-05 DIAGNOSIS — J45909 Unspecified asthma, uncomplicated: Secondary | ICD-10-CM

## 2012-05-05 DIAGNOSIS — J45998 Other asthma: Secondary | ICD-10-CM

## 2012-05-05 DIAGNOSIS — L309 Dermatitis, unspecified: Secondary | ICD-10-CM

## 2012-05-05 MED ORDER — MOMETASONE FUROATE 0.1 % EX OINT: TOPICAL_OINTMENT | Freq: Two times a day (BID) | CUTANEOUS | Status: DC

## 2012-05-05 NOTE — Progress Notes (Signed)
  Subjective:    Patient ID: [name], female    DOB: 04-18-1951, 61 y.o.   MRN: [id number]  HPI Symptoms actually began in the summer of 2012 as 2 bumps over the right Achilles tendon area. These itch intermittently. As  of this June there's been progression of the pruritus with associated rash over the calf.  She has used over-the-counter preparations with minimal response. None have aggravated lesions.  Significantly she does have asthma; she does not have a history of eczema. Her asthma has been well controlled; she has had used her rescue agent with exposure to significant temperature differentials.    Review of Systems She denies associated fever, chills, or weight loss. There have been no topical triggers or definite vector agents associated with the present illness.     Objective:   Physical Exam Gen.:  well-nourished; in no acute distress Eyes: no icterus Heart: Normal rhythm and rate without significant murmur, gallop, or extra heart sounds Lungs: Chest clear to auscultation without rales,rales, wheezes Skin: Warm and dry. Eczematoid rash with scattered excoriations right inferior calf 10 x 3.5 cm. Absent great toenails; no fungal nail changes or  no onycholysis  Vascular: Pedal pulses are excellent. She does have varicose veins over the left calf Psych: Normally communicative and interactive         Assessment & Plan:  #1 eczematoid dermatitis right calf  #2 asthma, well controlled  Plan: See orders and recommendations

## 2012-05-05 NOTE — Patient Instructions (Addendum)
If you activate My Chart; the results can be released to you as soon as they populate from the lab. If you choose not to use this program; the labs have to be reviewed, copied & mailed   causing a delay in getting the results to you. 

## 2012-05-30 ENCOUNTER — Other Ambulatory Visit: Payer: Self-pay | Admitting: Obstetrics and Gynecology

## 2012-05-30 DIAGNOSIS — Z1231 Encounter for screening mammogram for malignant neoplasm of breast: Secondary | ICD-10-CM

## 2012-06-01 ENCOUNTER — Ambulatory Visit (INDEPENDENT_AMBULATORY_CARE_PROVIDER_SITE_OTHER): Payer: BC Managed Care – PPO | Admitting: Family Medicine

## 2012-06-01 VITALS — BP 143/70 | HR 75 | Temp 98.2°F | Resp 16 | Ht 62.0 in | Wt 211.2 lb

## 2012-06-01 DIAGNOSIS — M545 Low back pain, unspecified: Secondary | ICD-10-CM

## 2012-06-01 DIAGNOSIS — R350 Frequency of micturition: Secondary | ICD-10-CM

## 2012-06-01 DIAGNOSIS — N390 Urinary tract infection, site not specified: Secondary | ICD-10-CM

## 2012-06-01 LAB — POCT URINALYSIS DIPSTICK
Bilirubin, UA: NEGATIVE
Glucose, UA: NEGATIVE
Ketones, UA: NEGATIVE
Nitrite, UA: NEGATIVE
Spec Grav, UA: <=1.005
Urobilinogen, UA: 0.2
pH, UA: 5.5

## 2012-06-01 LAB — POCT UA - MICROSCOPIC ONLY
Casts, Ur, LPF, POC: NEGATIVE
Crystals, Ur, HPF, POC: NEGATIVE
Mucus, UA: NEGATIVE
Yeast, UA: NEGATIVE

## 2012-06-01 MED ORDER — PHENAZOPYRIDINE HCL 200 MG PO TABS: 200.0000 mg | ORAL_TABLET | Freq: Three times a day (TID) | ORAL | Status: DC | PRN

## 2012-06-01 MED ORDER — CIPROFLOXACIN HCL 500 MG PO TABS: 500.0000 mg | ORAL_TABLET | Freq: Two times a day (BID) | ORAL | Status: DC

## 2012-06-01 NOTE — Progress Notes (Signed)
KEYASIA JOLLIFF MRN: [id number], DOB: 02-Nov-1950, 61 y.o. Date of Encounter: 06/01/2012, 3:47 PM  Primary Physician: Marga Melnick, MD  Chief Complaint:  Chief Complaint  Patient presents with  . Urinary Frequency  . Back Pain    HPI: 61 y.o. year old female presents with a 3 day history of nasal congestion, post nasal drip, sore throat, and cough. Mild sinus pressure. Afebrile. No chills. Nasal congestion thick and green/yellow. Cough is productive of green/yellow sputum and not associated with time of day. Ears feel full, leading to sensation of muffled hearing. Has tried OTC cold preps without success. No GI complaints.   No sick contacts, recent antibiotics, or recent travels.   No leg trauma, sedentary periods, h/o cancer, or tobacco use.  Past Medical History  Diagnosis Date  . Arthritis   . Asthma   . Diabetes mellitus   . GERD (gastroesophageal reflux disease)   . Heart murmur   . Hyperlipidemia   . Hypertension   . Cough   . Wheezing   . Palpitations   . Abdominal pain   . Nausea      Home Meds: Prior to Admission medications   Medication Sig Start Date End Date Taking? Authorizing Provider  ACCUPRIL 40 MG tablet TAKE 1 TABLET ONCE DAILY. 04/26/12  Yes Pecola Lawless, MD  albuterol (PROVENTIL,VENTOLIN) 90 MCG/ACT inhaler Inhale 2 puffs into the lungs every 6 (six) hours as needed. For shortness of breath   Yes Historical Provider, MD  amLODipine (NORVASC) 10 MG tablet TAKE (1) TABLET DAILY FOR HIGH BLOOD PRESSURE. 03/31/12  Yes Pecola Lawless, MD  ascorbic acid (VITAMIN C) 1000 MG tablet Take 1,000 mg by mouth 2 (two) times daily.     Yes Historical Provider, MD  aspirin 325 MG tablet Take 325 mg by mouth every morning.    Yes Historical Provider, MD  atenolol (TENORMIN) 50 MG tablet TAKE 1 TABLET ONCE DAILY. 03/24/12  Yes Pecola Lawless, MD  bimatoprost (LUMIGAN) 0.01 % SOLN Place 1 drop into both eyes at bedtime.    Yes Historical Provider, MD    budesonide-formoterol (SYMBICORT) 160-4.5 MCG/ACT inhaler Inhale 2 puffs into the lungs 2 (two) times daily as needed. FOR SHORTNESS OF BREATH 03/24/12  Yes Pecola Lawless, MD  CALCIUM-MAG-VIT C-VIT D PO Take 2 tablets by mouth daily.     Yes Historical Provider, MD  CELEBREX 200 MG capsule Take 200 mg by mouth 2 (two) times daily.  04/23/11  Yes Historical Provider, MD  Burton Apley Oil (CHIA SEED OIL EXTRACT PO) Take by mouth. Once in the am (1 tablespoon into protein drink)   Yes Historical Provider, MD  CRESTOR 10 MG tablet Take 10 mg by mouth at bedtime.  04/23/11  Yes Historical Provider, MD  dextromethorphan-guaiFENesin (MUCINEX DM) 30-600 MG per 12 hr tablet Take 1 tablet by mouth every 12 (twelve) hours.    Yes Historical Provider, MD  EFFIENT 10 MG TABS Take 10 mg by mouth at bedtime.  04/23/11  Yes Historical Provider, MD  estradiol (ESTRACE) 0.5 MG tablet Take 0.5 mg by mouth every morning.  04/23/11  Yes Historical Provider, MD  glimepiride (AMARYL) 4 MG tablet Take 0.5 tablets (2 mg total) by mouth daily before breakfast. 07/23/11  Yes Pecola Lawless, MD  glucose blood (ONE TOUCH ULTRA TEST) test strip Use as instructed 10/05/11 10/04/12 Yes Sheliah Hatch, MD  HYDROcodone-acetaminophen (NORCO) 10-325 MG per tablet Take 1 tablet by mouth as needed. FOR BACK  06/06/11  Yes Historical Provider, MD  isosorbide mononitrate (IMDUR) 30 MG 24 hr tablet Take 30 mg by mouth. 1/2 BY MOUTH IN THE AM   Yes Historical Provider, MD  LORazepam (ATIVAN) 0.5 MG tablet Take 1 mg by mouth at bedtime. 1-2 TABS BY MOUTH IN THE EVENING 04/23/11  Yes Historical Provider, MD  Melatonin 5 MG TABS Take 1 tablet by mouth at bedtime.     Yes Historical Provider, MD  mometasone (ELOCON) 0.1 % ointment Apply topically 2 (two) times daily. 05/05/12  Yes Pecola Lawless, MD  Multiple Vitamins-Iron (MULTIVITAMINS WITH IRON) TABS Take 1 tablet by mouth 2 (two) times daily.     Yes Historical Provider, MD  omega-3 acid ethyl  esters (LOVAZA) 1 G capsule Take 1 g by mouth daily.     Yes Historical Provider, MD  omeprazole (PRILOSEC) 20 MG capsule Take 20 mg by mouth daily.     Yes Historical Provider, MD  [facility] DELICA LANCETS FINE MISC 1 Units by Does not apply route as directed. 10/05/11  Yes Sheliah Hatch, MD  OVER THE COUNTER MEDICATION Take 1 packet by mouth daily. SPIRU-TEIN SUPPLEMENT    Yes Historical Provider, MD  PARoxetine (PAXIL) 10 MG tablet Take 10 mg by mouth every morning. 2 1/2 TAB BY MOUTH DAILY   Yes Historical Provider, MD  PROAIR HFA 108 (90 BASE) MCG/ACT inhaler USE ONE PUFF AS NEEDED. 05/31/11  Yes Pecola Lawless, MD  senna (SENOKOT) 8.6 MG TABS Take 1 tablet by mouth daily.     Yes Historical Provider, MD  sitaGLIPtan-metformin (JANUMET) 50-1000 MG per tablet TAKE 1/2 TABLET IN THE MORNING AND 1 TABLET IN THE EVENING. 03/24/12  Yes Pecola Lawless, MD    Allergies:  Allergies  Allergen Reactions  . Benadryl (Diphenhydramine Hcl) Other (See Comments)    Restless leg  . Clopidogrel Bisulfate     Nausea & pain  . Contrast Media (Iodinated Diagnostic Agents) Palpitations    Rapid heart rate, hot    History   Social History  . Marital Status: Single    Spouse Name: N/A    Number of Children: N/A  . Years of Education: N/A   Occupational History  . Not on file.   Social History Main Topics  . Smoking status: Former Games developer  . Smokeless tobacco: Never Used  . Alcohol Use: No  . Drug Use: No  . Sexually Active: Not on file   Other Topics Concern  . Not on file   Social History Narrative  . No narrative on file     Review of Systems: Constitutional: negative for chills, fever, night sweats or weight changes Cardiovascular: negative for chest pain or palpitations Respiratory: negative for hemoptysis, wheezing, or shortness of breath Abdominal: negative for abdominal pain, nausea, vomiting or diarrhea Dermatological: negative for rash Neurologic: negative for  headache   Physical Exam: Blood pressure 143/70, pulse 75, temperature 98.2 F (36.8 C), temperature source Oral, resp. rate 16, height 5\' 2"  (1.575 m), weight 211 lb 3.2 oz (95.8 kg), SpO2 94.00%., Body mass index is 38.63 kg/(m^2). General: Well developed, well nourished, in no acute distress. Head: Normocephalic, atraumatic, eyes without discharge, sclera non-icteric, nares are congested. Bilateral auditory canals clear, TM's are without perforation, pearly grey with reflective cone of light bilaterally. No sinus TTP. Oral cavity moist, dentition normal. Posterior pharynx with post nasal drip and mild erythema. No peritonsillar abscess or tonsillar exudate. Neck: Supple. No thyromegaly. Full ROM. No  lymphadenopathy. Msk:  Strength and tone normal for age. Extremities: No clubbing or cyanosis. No edema. Neuro: Alert and oriented X 3. Moves all extremities spontaneously. CNII-XII grossly in tact. Psych:  Responds to questions appropriately with a normal affect.   Labs:   ASSESSMENT AND PLAN:  61 y.o. year old female with bronchitis. 1. Frequency of urination  POCT UA - Microscopic Only, POCT urinalysis dipstick, Urine culture, phenazopyridine (PYRIDIUM) 200 MG tablet, ciprofloxacin (CIPRO) 500 MG tablet  2. Lower back pain  POCT UA - Microscopic Only, POCT urinalysis dipstick    - -Tylenol/Motrin prn -Rest/fluids -RTC precautions -RTC 3-5 days if no improvement  Signed, Elvina Sidle, MD 06/01/2012 3:47 PM

## 2012-06-01 NOTE — Patient Instructions (Signed)
Urinary Tract Infection Urinary tract infections (UTIs) can develop anywhere along your urinary tract. Your urinary tract is your body's drainage system for removing wastes and extra water. Your urinary tract includes two kidneys, two ureters, a bladder, and a urethra. Your kidneys are a pair of bean-shaped organs. Each kidney is about the size of your fist. They are located below your ribs, one on each side of your spine. CAUSES Infections are caused by microbes, which are microscopic organisms, including fungi, viruses, and bacteria. These organisms are so small that they can only be seen through a microscope. Bacteria are the microbes that most commonly cause UTIs. SYMPTOMS  Symptoms of UTIs may vary by age and gender of the patient and by the location of the infection. Symptoms in young women typically include a frequent and intense urge to urinate and a painful, burning feeling in the bladder or urethra during urination. Older women and men are more likely to be tired, shaky, and weak and have muscle aches and abdominal pain. A fever may mean the infection is in your kidneys. Other symptoms of a kidney infection include pain in your back or sides below the ribs, nausea, and vomiting. DIAGNOSIS To diagnose a UTI, your caregiver will ask you about your symptoms. Your caregiver also will ask to provide a urine sample. The urine sample will be tested for bacteria and white blood cells. White blood cells are made by your body to help fight infection. TREATMENT  Typically, UTIs can be treated with medication. Because most UTIs are caused by a bacterial infection, they usually can be treated with the use of antibiotics. The choice of antibiotic and length of treatment depend on your symptoms and the type of bacteria causing your infection. HOME CARE INSTRUCTIONS  If you were prescribed antibiotics, take them exactly as your caregiver instructs you. Finish the medication even if you feel better after you  have only taken some of the medication.  Drink enough water and fluids to keep your urine clear or pale yellow.  Avoid caffeine, tea, and carbonated beverages. They tend to irritate your bladder.  Empty your bladder often. Avoid holding urine for long periods of time.  Empty your bladder before and after sexual intercourse.  After a bowel movement, women should cleanse from front to back. Use each tissue only once. SEEK MEDICAL CARE IF:   You have back pain.  You develop a fever.  Your symptoms do not begin to resolve within 3 days. SEEK IMMEDIATE MEDICAL CARE IF:   You have severe back pain or lower abdominal pain.  You develop chills.  You have nausea or vomiting.  You have continued burning or discomfort with urination. MAKE SURE YOU:   Understand these instructions.  Will watch your condition.  Will get help right away if you are not doing well or get worse. Document Released: 05/09/2005 Document Revised: 01/29/2012 Document Reviewed: 09/07/2011 Exit[employer] Patient Information 2013 [employer].  

## 2012-06-04 LAB — URINE CULTURE
Colony Count: NO GROWTH
Organism ID, Bacteria: NO GROWTH

## 2012-06-19 ENCOUNTER — Other Ambulatory Visit: Payer: Self-pay | Admitting: Internal Medicine

## 2012-07-04 ENCOUNTER — Ambulatory Visit: Payer: BC Managed Care – PPO

## 2012-07-14 ENCOUNTER — Ambulatory Visit (INDEPENDENT_AMBULATORY_CARE_PROVIDER_SITE_OTHER): Payer: BC Managed Care – PPO | Admitting: Internal Medicine

## 2012-07-14 ENCOUNTER — Encounter: Payer: Self-pay | Admitting: Internal Medicine

## 2012-07-14 VITALS — BP 130/70 | HR 64 | Temp 98.0°F | Resp 12 | Ht 62.0 in | Wt 207.0 lb

## 2012-07-14 DIAGNOSIS — J45909 Unspecified asthma, uncomplicated: Secondary | ICD-10-CM

## 2012-07-14 DIAGNOSIS — Z0000 Encounter for general adult medical examination without abnormal findings: Secondary | ICD-10-CM

## 2012-07-14 LAB — CBC WITH DIFFERENTIAL/PLATELET
Basophils Absolute: 0 10*3/uL (ref 0.0–0.1)
Basophils Relative: 0.5 % (ref 0.0–3.0)
Eosinophils Absolute: 0.4 10*3/uL (ref 0.0–0.7)
Eosinophils Relative: 4.6 % (ref 0.0–5.0)
HCT: 37.2 % (ref 36.0–46.0)
Hemoglobin: 12.4 g/dL (ref 12.0–15.0)
Lymphocytes Relative: 15.2 % (ref 12.0–46.0)
Lymphs Abs: 1.2 10*3/uL (ref 0.7–4.0)
MCHC: 33.2 g/dL (ref 30.0–36.0)
MCV: 91.1 fl (ref 78.0–100.0)
Monocytes Absolute: 0.6 10*3/uL (ref 0.1–1.0)
Monocytes Relative: 7.4 % (ref 3.0–12.0)
Neutro Abs: 5.9 10*3/uL (ref 1.4–7.7)
Neutrophils Relative %: 72.3 % (ref 43.0–77.0)
Platelets: 329 10*3/uL (ref 150.0–400.0)
RBC: 4.08 Mil/uL (ref 3.87–5.11)
RDW: 12.8 % (ref 11.5–14.6)
WBC: 8.1 10*3/uL (ref 4.5–10.5)

## 2012-07-14 LAB — HEPATIC FUNCTION PANEL
ALT: 40 U/L — ABNORMAL HIGH (ref 0–35)
AST: 45 U/L — ABNORMAL HIGH (ref 0–37)
Albumin: 4.1 g/dL (ref 3.5–5.2)
Alkaline Phosphatase: 55 U/L (ref 39–117)
Bilirubin, Direct: 0 mg/dL (ref 0.0–0.3)
Total Bilirubin: 0.5 mg/dL (ref 0.3–1.2)
Total Protein: 7.5 g/dL (ref 6.0–8.3)

## 2012-07-14 LAB — LIPID PANEL
Cholesterol: 129 mg/dL (ref 0–200)
HDL: 44.8 mg/dL (ref >39.00)
LDL Cholesterol: 63 mg/dL (ref 0–99)
Total CHOL/HDL Ratio: 3
Triglycerides: 105 mg/dL (ref 0.0–149.0)
VLDL: 21 mg/dL (ref 0.0–40.0)

## 2012-07-14 LAB — HEMOGLOBIN A1C: Hgb A1c MFr Bld: 6.6 % — ABNORMAL HIGH (ref 4.6–6.5)

## 2012-07-14 LAB — BASIC METABOLIC PANEL
BUN: 13 mg/dL (ref 6–23)
CO2: 27 mEq/L (ref 19–32)
Calcium: 8.8 mg/dL (ref 8.4–10.5)
Chloride: 97 mEq/L (ref 96–112)
Creatinine, Ser: 0.7 mg/dL (ref 0.4–1.2)
GFR: 94.26 mL/min (ref >60.00)
Glucose, Bld: 158 mg/dL — ABNORMAL HIGH (ref 70–99)
Potassium: 4.1 mEq/L (ref 3.5–5.1)
Sodium: 133 mEq/L — ABNORMAL LOW (ref 135–145)

## 2012-07-14 LAB — MICROALBUMIN / CREATININE URINE RATIO
Creatinine,U: 43.9 mg/dL
Microalb Creat Ratio: 28.3 mg/g (ref 0.0–30.0)
Microalb, Ur: 12.4 mg/dL — ABNORMAL HIGH (ref 0.0–1.9)

## 2012-07-14 LAB — TSH: TSH: 2.35 u[IU]/mL (ref 0.35–5.50)

## 2012-07-14 MED ORDER — AZITHROMYCIN 250 MG PO TABS: ORAL_TABLET | ORAL | Status: DC

## 2012-07-14 NOTE — Patient Instructions (Addendum)
Plain Mucinex for thick secretions ;force NON dairy fluids . Use a Neti pot daily as needed for sinus congestion; going from open side to congested side . Nasal cleansing in the shower as discussed. Make sure that all residual soap is removed to prevent irritation. Fluticasone 1 spray in each nostril twice a day as needed. Use the "crossover" technique as discussed. Plain Allegra 160 daily as needed for itchy eyes & sneezing. Symbicort one-2  inhalations every 12 hours; gargle and spit after use . Please review the medication list in the After Visit Summary provided.Please write the name of the prescribing physician to the right of the medication and share this with all medical staff seen at each appointment. This will help provide continuity of care; help optimize therapeutic interventions;and help prevent drug:drug adverse reaction.   If you activate My Chart; the results can be released to you as soon as they populate from the lab. If you choose not to use this program; the labs have to be reviewed, copied & mailed   causing a delay in getting the results to you.

## 2012-07-14 NOTE — Progress Notes (Signed)
  Subjective:    Patient ID: [name], female    DOB: 14-Jun-1951, 61 y.o.   MRN: [id number]  HPI [name] is here for a physical;acute issues include an acute respiratory tract infection.      Review of Systems  Symptoms began 07/10/12 a sore throat which responded to saline gargles. This was associated with some sneezing and minor itchy, watery eyes. Subtotally she developed green sputum. Nasal secretions have been clear. There was no significant associated frontal headache or facial pain. She denies fever, chills, or sweats. Symptoms do appear to be improving ; but she did have an asthma exacerbation which responded to her rescue inhaler .     Objective:   Physical Exam  Gen.:  well-nourished in appearance. Alert, appropriate and cooperative throughout exam. Head: Normocephalic without obvious abnormalities Eyes: No corneal or conjunctival inflammation noted. Pupils equal round reactive to light and accommodation. Fundal exam is benign without hemorrhages, exudate, papilledema. Extraocular motion intact. Vision grossly decreased OS without  lenses. Ears: External  ear exam reveals no significant lesions or deformities. Canals clear .TMs normal. Hearing is grossly normal bilaterally. Nose: External nasal exam reveals no deformity or inflammation. Nasal mucosa are dry & slightly erythematous. No lesions or exudates noted.  Mouth: Oral mucosa and oropharynx reveal no lesions or exudates. Teeth in state of  repair. Neck: No deformities, masses, or tenderness noted. Range of motion & Thyroid normal. Lungs: Normal respiratory effort; chest expands symmetrically. Homogenous ,diffuse musical  Wheezes w/o increased work of breathing. Heart: Normal rate and rhythm. Normal S1 and S2. No gallop, click, or rub. Grade 1/2-1/6 systolic murmur LSB . Abdomen: Bowel sounds normal; abdomen soft and nontender. No masses, organomegaly or hernias noted. Genitalia: Dr [name], Gyn                        Musculoskeletal/extremities: Accentuated curvature of upper thoracic  spine.  No clubbing, cyanosis, edema, or deformity noted. Range of motion  normal .Tone & strength  Normal.Crepitus L knee. Nail health  good. Vascular: Carotid, radial artery, dorsalis pedis and  posterior tibial pulses are full and equal. Faint carotid  Bruits vs radiation of murmur present.(evaluated by Dr [name]) Neurologic: Alert and oriented x3. Deep tendon reflexes symmetrical and normal.          Skin: Intact without suspicious lesions or rashes. Lymph: No cervical, axillary lymphadenopathy present. Psych: Mood and affect are normal. Normally interactive                                                                                         Assessment & Plan:  #1 comprehensive physical exam; no acute findings #2 asthmatic bronchitis Plan: see Orders

## 2012-07-22 ENCOUNTER — Other Ambulatory Visit: Payer: Self-pay | Admitting: Internal Medicine

## 2012-07-22 MED ORDER — QUINAPRIL HCL 40 MG PO TABS: ORAL_TABLET | ORAL | Status: DC

## 2012-07-22 NOTE — Telephone Encounter (Signed)
refill Quinapril HCl (Tab) 40 MG TAKE 1 TABLET ONCE DAILY #30 last fill 11.13.13, last ov wt/labs V70 12.2.13

## 2012-07-22 NOTE — Telephone Encounter (Signed)
Rx sent 

## 2012-08-15 ENCOUNTER — Ambulatory Visit: Payer: Self-pay

## 2012-08-20 ENCOUNTER — Other Ambulatory Visit: Payer: Self-pay | Admitting: Internal Medicine

## 2012-09-04 ENCOUNTER — Ambulatory Visit: Payer: BC Managed Care – PPO | Admitting: Obstetrics and Gynecology

## 2012-09-12 ENCOUNTER — Ambulatory Visit
Admission: RE | Admit: 2012-09-12 | Discharge: 2012-09-12 | Disposition: A | Discharge status: Home / Self Care | Payer: BC Managed Care – PPO | Source: Ambulatory Visit | Attending: Obstetrics and Gynecology | Admitting: Obstetrics and Gynecology

## 2012-09-12 DIAGNOSIS — Z1231 Encounter for screening mammogram for malignant neoplasm of breast: Secondary | ICD-10-CM

## 2012-09-20 ENCOUNTER — Other Ambulatory Visit: Payer: Self-pay | Admitting: Internal Medicine

## 2012-09-27 ENCOUNTER — Other Ambulatory Visit: Payer: Self-pay

## 2012-10-01 ENCOUNTER — Ambulatory Visit: Payer: Self-pay | Admitting: Obstetrics and Gynecology

## 2012-10-03 ENCOUNTER — Other Ambulatory Visit: Payer: Self-pay | Admitting: Internal Medicine

## 2012-10-03 NOTE — Telephone Encounter (Signed)
OK X1 

## 2012-10-03 NOTE — Telephone Encounter (Signed)
I see Elocon Ointment on patient's medication list but not Bactroban, Hopp please advise

## 2012-10-22 ENCOUNTER — Telehealth: Payer: Self-pay | Admitting: Internal Medicine

## 2012-10-22 DIAGNOSIS — E119 Type 2 diabetes mellitus without complications: Secondary | ICD-10-CM

## 2012-10-22 MED ORDER — SITAGLIPTIN PHOS-METFORMIN HCL 50-1000 MG PO TABS: ORAL_TABLET | ORAL | Status: DC

## 2012-10-22 NOTE — Telephone Encounter (Signed)
Refill: janumet 50/1000 mg tablet. Take 1/2 tablet in the morning and 1 tablet in the evening. Qty 45. Last fill 09-22-12

## 2012-10-22 NOTE — Telephone Encounter (Signed)
RX sent electronically. Future order placed for labs due in April

## 2012-11-11 LAB — HM PAP SMEAR

## 2012-11-20 ENCOUNTER — Other Ambulatory Visit: Payer: Self-pay | Admitting: Internal Medicine

## 2012-11-20 NOTE — Telephone Encounter (Signed)
Future order placed 

## 2012-12-20 ENCOUNTER — Other Ambulatory Visit: Payer: Self-pay | Admitting: Internal Medicine

## 2012-12-22 NOTE — Telephone Encounter (Signed)
Future order already placed a1c 250.00

## 2013-01-09 ENCOUNTER — Other Ambulatory Visit: Payer: BC Managed Care – PPO

## 2013-01-16 ENCOUNTER — Other Ambulatory Visit (INDEPENDENT_AMBULATORY_CARE_PROVIDER_SITE_OTHER): Payer: BC Managed Care – PPO

## 2013-01-16 DIAGNOSIS — E119 Type 2 diabetes mellitus without complications: Secondary | ICD-10-CM

## 2013-01-16 LAB — HEMOGLOBIN A1C: Hgb A1c MFr Bld: 6.7 % — ABNORMAL HIGH (ref 4.6–6.5)

## 2013-01-20 ENCOUNTER — Other Ambulatory Visit: Payer: Self-pay | Admitting: Internal Medicine

## 2013-01-21 NOTE — Telephone Encounter (Signed)
Left message on VM for patient to return call when available. Reason for call (not left on VM) : ? If patient has enough medication to last until pending appointment on Friday. Per MD patient to see him before refilling medication. If patient does not have enough I can provide her with a sample pack.

## 2013-01-23 ENCOUNTER — Encounter: Payer: Self-pay | Admitting: Internal Medicine

## 2013-01-23 ENCOUNTER — Ambulatory Visit (INDEPENDENT_AMBULATORY_CARE_PROVIDER_SITE_OTHER): Payer: BC Managed Care – PPO | Admitting: Internal Medicine

## 2013-01-23 VITALS — BP 138/70 | HR 68 | Wt 209.0 lb

## 2013-01-23 DIAGNOSIS — E119 Type 2 diabetes mellitus without complications: Secondary | ICD-10-CM

## 2013-01-23 MED ORDER — SITAGLIPTIN PHOS-METFORMIN HCL 50-1000 MG PO TABS: 1.0000 | ORAL_TABLET | Freq: Two times a day (BID) | ORAL | Status: DC

## 2013-01-23 NOTE — Patient Instructions (Addendum)
Please  schedule fasting Labs in 16 weeks after med changes: BMET,Lipids, hepatic panel, TSH, A1c, urine microalbumin. PLEASE BRING THESE INSTRUCTIONS TO FOLLOW UP  LAB APPOINTMENT.This will guarantee correct labs are drawn, eliminating need for repeat blood sampling ( needle sticks ! ). Diagnoses /Codes: 250.00, 995.20

## 2013-01-23 NOTE — Telephone Encounter (Signed)
Given samples @ appt

## 2013-01-23 NOTE — Progress Notes (Signed)
Subjective:    Patient ID: [name], female    DOB: 09-22-1950, 62 y.o.   MRN: [id number]  HPI Diabetes status assessment: Fasting or morning glucose  is not monitored. Highest glucose 2 hours after any meal is not monitored. No hypoglycemia reported                                                                                                                 Regular exercise described as walking 2 blocks/ day 5  times per week  No specific nutrition/diet followed Medication compliance is good. No medication adverse effects noted. Eye exam current. Foot care not current  A1c 6.7 % (average sugar 145 with long-term 34 %  risk )   .       Review of Systems  No excess thirst ;  excess hunger ; or excess urination reported                              No lightheadedness with standing reported No chest pain ; palpitations ; claudication described .  Some EIB                                                                                                                           No non healing skin  ulcers or sores of extremities noted. Slight  Numbness of great toe bilaterally w/o tingling or burning in feet                                                                                                                                             No significant change in weight . No blurred,double, or loss of vision reported       Objective:   Physical Exam Gen.: well-nourished in appearance. Alert, appropriate and cooperative throughout exam.  Eyes:  No corneal or conjunctival inflammation noted.  Neck: No deformities, masses, or tenderness noted.  Thyroid normal. Lungs: Normal respiratory effort; chest expands symmetrically. Lungs are clear to auscultation without rales, wheezes, or increased work of breathing. Heart: Normal rate and rhythm. Normal S1 and S2. No gallop, click, or rub. Grade 1/6 systolic murmur. Abdomen: Bowel sounds normal; abdomen soft and nontender. No  masses, organomegaly or hernias noted.                                  Musculoskeletal/extremities: No clubbing, cyanosis, edema, or significant extremity  deformity noted. Tone & strength  Normal. Joints normal . Nail health good. Able to lie down & sit up w/o help.Absent great toe nails Vascular: Carotid, radial artery, dorsalis pedis and  posterior tibial pulses are full and equal. Bilateral carotid bruits present. Neurologic: Alert and oriented x3. Deep tendon reflexes symmetrical and normal.  Light touch normal over feet.         Skin: Intact without suspicious lesions or rashes. Lymph: No cervical, axillary lymphadenopathy present. Psych: Mood and affect are normal. Normally interactive                                                                                        Assessment & Plan:  See Current Assessment & Plan in Problem List under specific Diagnosis

## 2013-01-23 NOTE — Assessment & Plan Note (Signed)
Increase Janumet as recent medical studies suggest Sulfonylureas such as Glymiperide may have potentially negative cardiac impact. A1c & urine microalb in 4 mos

## 2013-01-27 ENCOUNTER — Encounter: Payer: Self-pay | Admitting: Internal Medicine

## 2013-02-17 ENCOUNTER — Other Ambulatory Visit: Payer: Self-pay | Admitting: *Deleted

## 2013-02-17 ENCOUNTER — Telehealth: Payer: Self-pay | Admitting: *Deleted

## 2013-02-17 MED ORDER — ISOSORBIDE MONONITRATE ER 30 MG PO TB24: 15.0000 mg | ORAL_TABLET | Freq: Every day | ORAL | Status: DC

## 2013-02-17 MED ORDER — ROSUVASTATIN CALCIUM 10 MG PO TABS: 10.0000 mg | ORAL_TABLET | Freq: Every day | ORAL | Status: DC

## 2013-02-17 NOTE — Telephone Encounter (Signed)
Refill medication sent to pharmacy electronically on 02/17/13

## 2013-02-17 NOTE — Telephone Encounter (Signed)
Rx was sent to pharmacy electronically. 

## 2013-03-19 ENCOUNTER — Other Ambulatory Visit: Payer: Self-pay | Admitting: *Deleted

## 2013-03-19 MED ORDER — PRASUGREL HCL 10 MG PO TABS: 10.0000 mg | ORAL_TABLET | Freq: Every day | ORAL | Status: DC

## 2013-03-19 MED ORDER — AMLODIPINE BESYLATE 10 MG PO TABS: ORAL_TABLET | ORAL | Status: DC

## 2013-03-19 NOTE — Telephone Encounter (Signed)
Rx was filled for Amlodipine 10 mg 03/19/13 for 30 day and  5 R. AG cma

## 2013-04-17 ENCOUNTER — Other Ambulatory Visit: Payer: Self-pay | Admitting: *Deleted

## 2013-04-17 MED ORDER — ATENOLOL 50 MG PO TABS: ORAL_TABLET | ORAL | Status: DC

## 2013-04-17 NOTE — Telephone Encounter (Signed)
Rx was sent to pharmacy electronically. 

## 2013-04-27 ENCOUNTER — Encounter: Payer: Self-pay | Admitting: Cardiology

## 2013-04-27 LAB — HM DIABETES EYE EXAM

## 2013-04-28 ENCOUNTER — Ambulatory Visit (INDEPENDENT_AMBULATORY_CARE_PROVIDER_SITE_OTHER): Payer: BC Managed Care – PPO | Admitting: Cardiology

## 2013-04-28 ENCOUNTER — Encounter: Payer: Self-pay | Admitting: *Deleted

## 2013-04-28 ENCOUNTER — Encounter: Payer: Self-pay | Admitting: Cardiovascular Disease

## 2013-04-28 ENCOUNTER — Encounter: Payer: Self-pay | Admitting: Cardiology

## 2013-04-28 VITALS — BP 138/60 | HR 67 | Ht 62.0 in | Wt 205.0 lb

## 2013-04-28 DIAGNOSIS — E119 Type 2 diabetes mellitus without complications: Secondary | ICD-10-CM

## 2013-04-28 DIAGNOSIS — J45909 Unspecified asthma, uncomplicated: Secondary | ICD-10-CM

## 2013-04-28 DIAGNOSIS — I208 Other forms of angina pectoris: Secondary | ICD-10-CM

## 2013-04-28 DIAGNOSIS — I209 Angina pectoris, unspecified: Secondary | ICD-10-CM

## 2013-04-28 DIAGNOSIS — I2510 Atherosclerotic heart disease of native coronary artery without angina pectoris: Secondary | ICD-10-CM

## 2013-04-28 NOTE — Progress Notes (Signed)
04/28/2013   PCP: Marga Melnick, MD   Chief Complaint  Patient presents with  . Chest Pain    C/o CP on saturday (04/25/2013) with slight radiation to her jaw, lightheadedness, and nausea, came on after physical activity. Did not need NTG. Lasted for a couple hours. Sunday and Monday c/o fatigue and lack of energy. Also has severe asthma-hard to determine whether tightness is from asthma or from her heart. Shortness of breath on exertion.    Primary Cardiologist: Dr. Erlene Quan   HPI:  62 year old white divorced female with 2 adopted children and previous patient of Dr. Elsie Lincoln who is here today for recent onset of chest pain.   She has a history of stent, drug-eluting stent, to the distal circumflex posterior lateral branch October of 2009 with residual disease in the PDA, normal LV function. A Myoview was performed September 2009 without ischemia. In 2012 she had chest discomfort she is placed on into her and a stress test w as done but not obtained because of increasing chest tightness and dyspnea. Cardiac cath revealed a patent stent she did have residual 50% distal PDA lesion normal LV function and 50-60% RCA stenosis. She also has 30% left main. Her previous lab work LDL was 63 and HDL of 45. She is diabetic.  Approximately one week ago she was working in the yard and developed chest pressure that was strong, lasted a couple of hours it did radiate up into her neck and she was nauseated. She does have asthma that time she's unsure if it's her asthma or if another problem. Since that time she's been carrying her sublingual nitroglycerin the room she does not want to take it when she absolutely has to.  Her diabetes has been stable and she has no other symptoms.  She continues with episodic chest pressure, tightness.    Allergies  Allergen Reactions  . Benadryl [Diphenhydramine Hcl] Other (See Comments)    Restless leg  . Clopidogrel Bisulfate     Nausea & pain  . Contrast  Media [Iodinated Diagnostic Agents] Palpitations    Rapid heart rate, hot    Current Outpatient Prescriptions  Medication Sig Dispense Refill  . albuterol (PROVENTIL,VENTOLIN) 90 MCG/ACT inhaler Inhale 2 puffs into the lungs every 6 (six) hours as needed. For shortness of breath      . amLODipine (NORVASC) 10 MG tablet TAKE (1) TABLET DAILY FOR HIGH BLOOD PRESSURE.  30 tablet  5  . ascorbic acid (VITAMIN C) 1000 MG tablet Take 1,000 mg by mouth 2 (two) times daily.        [name] aspirin 325 MG tablet Take 325 mg by mouth every morning.       [name] atenolol (TENORMIN) 50 MG tablet TAKE 1 TABLET ONCE DAILY.  30 tablet  4  . bimatoprost (LUMIGAN) 0.01 % SOLN Place 1 drop into both eyes at bedtime.       . budesonide-formoterol (SYMBICORT) 160-4.5 MCG/ACT inhaler Inhale 2 puffs into the lungs 2 (two) times daily as needed. FOR SHORTNESS OF BREATH  1 Inhaler  5  . CALCIUM-MAG-VIT C-VIT D PO Take 2 tablets by mouth daily.        . CELEBREX 200 MG capsule Take 200 mg by mouth 2 (two) times daily.       [name] estradiol (ESTRACE) 0.5 MG tablet Take 0.5 mg by mouth every morning.       [name] glimepiride (AMARYL) 4 MG tablet Take 4 mg by  mouth daily before breakfast.      . guaiFENesin (MUCINEX) 600 MG 12 hr tablet Take 600 mg by mouth 2 (two) times daily.      . isosorbide mononitrate (IMDUR) 30 MG 24 hr tablet Take 15 mg by mouth daily.      [name] LORazepam (ATIVAN) 0.5 MG tablet Take 1 mg by mouth at bedtime. 1-2 TABS BY MOUTH IN THE EVENING      . Melatonin 5 MG TABS Take 1 tablet by mouth at bedtime.        . mometasone (ELOCON) 0.1 % ointment Apply topically 2 (two) times daily.  45 g  1  . Multiple Vitamins-Iron (MULTIVITAMINS WITH IRON) TABS Take 1 tablet by mouth 2 (two) times daily.        . mupirocin ointment (BACTROBAN) 2 % USE ONCE DAILY AS DIRECTED.  22 g  0  . omega-3 acid ethyl esters (LOVAZA) 1 G capsule Take 1 g by mouth daily.        [name] omeprazole (PRILOSEC) 20 MG capsule Take 20 mg by mouth daily.          Letta Pate DELICA LANCETS FINE MISC 1 Units by Does not apply route as directed.  100 each  3  . OVER THE COUNTER MEDICATION Take 1 packet by mouth daily. SPIRU-TEIN SUPPLEMENT       . PARoxetine (PAXIL) 10 MG tablet Take 10 mg by mouth every morning. 2 1/2 TAB BY MOUTH DAILY      . prasugrel (EFFIENT) 10 MG TABS tablet Take 1 tablet (10 mg total) by mouth at bedtime.  30 tablet  5  . quinapril (ACCUPRIL) 40 MG tablet TAKE 1 TABLET ONCE DAILY.  90 tablet  3  . rosuvastatin (CRESTOR) 10 MG tablet Take 1 tablet (10 mg total) by mouth at bedtime.  30 tablet  5  . senna (SENOKOT) 8.6 MG TABS Take 1 tablet by mouth daily.        . sitaGLIPtin-metformin (JANUMET) 50-1000 MG per tablet Take 0.5 tablet (500mg ) with morning meal daily. Take 1 tablet (1,000mg ) with evening meal daily.      . Triamcinolone Acetonide (AZMACORT IN) Inhale 2 puffs 2-4 times daily as needed       No current facility-administered medications for this visit.    Past Medical History  Diagnosis Date  . Arthritis   . Asthma   . GERD (gastroesophageal reflux disease)   . Heart murmur   . Hyperlipidemia   . Hypertension   . Pneumonia 2008  . Goiter   . Diabetes mellitus     Type 2  . Anemia, unspecified   . Chronic low back pain   . Polycystic ovary disease     Hysterectomy in 1982 for this  . Bell's palsy     Facial nerve decompression in 2001  . Shortness of breath dyspnea     ECHO 02/26/08 showed only minor abnormalities  . Coronary artery disease     Myoview 04/12/11 was entirely normal. ECHO 02/26/08 showed only minor abnormalities. Stenting 05/26/08 of her posterolateral branch to the left circumflex coronary artery. Used a 2.5x36mm Taxus Monorail stent.  [name] COPD with asthma   . Glaucoma   . Spinal stenosis   . History of nuclear stress test 04/12/2011    lexiscan; normal pattern of perfusion; normal, low risk scan     Past Surgical History  Procedure Laterality Date  . Abdominal hysterectomy  1982    &  BSO; for polycystic ovary disease  . Facial nerve decompression  2001/2002    bells palsy   . Laparoscopic cholecystectomy  06/15/2011     Dr Derrell Lolling  . Colonoscopy      last 2009; Dr Matthias Hughs; due 2019  . Upper gi endoscopy  2009    negative  . Coronary angioplasty  05/26/2008    Stenting of her posterolateral branch to the left circumflex coronary artery. Used a 2.5x43mm Taxus Monorail stent.  . Transthoracic echocardiogram  02/26/2008    LV systolic function normal with mild conc LVH; LA mildly dilated; trace MR/TR    ZOX:WRUEAVW:UJ colds or fevers, no weight changes Skin:no rashes or ulcers HEENT:no blurred vision, no congestion CV:see HPI PUL:see HPI GI:no diarrhea constipation or melena, no indigestion GU:no hematuria, no dysuria MS:no joint pain, no claudication Neuro:no syncope, no lightheadedness Endo:+ diabetes, no thyroid disease  PHYSICAL EXAM BP 138/60  Pulse 67  Ht 5\' 2"  (1.575 m)  Wt 205 lb (92.987 kg)  BMI 37.49 kg/m2 General:Pleasant affect, NAD Skin:Warm and dry, brisk capillary refill HEENT:normocephalic, sclera clear, mucus membranes moist Neck:supple, no JVD, no bruits  Heart:S1S2 RRR without murmur, gallup, rub or click Lungs:clear without rales, rhonchi, or wheezes WJX:BJYN, non tender, + BS, do not palpate liver spleen or masses Ext:no lower ext edema, 2+ pedal pulses, 2+ radial pulses Neuro:alert and oriented, MAE, follows commands, + facial symmetry  EKG: Sinus rhythm without acute changes from previous tracings  ASSESSMENT AND PLAN Angina decubitus Developed chest tightness while doing yard work over week ago. Since that time she has had episodic chest discomfort the longest lasting a couple of hours. Initially she did have nausea and he did radiate up into her neck. She is somewhat clouded in that she has a history of asthma and sometimes she's not sure if it's asthma versus her cardiac issues. Plan will be for elective Lexiscan Myoview and with  those results we'll determine if cardiac catheterization is needed. I'll have her followup with me after the procedure on a daily.appearance in the office so we can make a plan with her.  She has nitroglycerin sublingual as needed but has not liked to take it if possible.  Asthma in adult No wheezes, again unsure of her chest pressure is related to asthma versus cardiac.  CAD (coronary artery disease), hx of LCX stent DES 2009,  last cath 2012 50-60% RCA stenosis, patent stent, 30 % LM. Proceed with nuclear stress test and possible cardiac catheterization if symptoms persist or if positive stress test.  DIABETES MELLITUS, TYPE II Stable, followed by PCP

## 2013-04-28 NOTE — Patient Instructions (Addendum)
Your physician has requested that you have a lexiscan myoview. For further information please visit [email or web address] Please follow instruction sheet, as given.  Please schedule for this week if possible.  Your physician recommends that you schedule a follow-up appointment in: 1-2 with Dr. Allyson Sabal.

## 2013-04-28 NOTE — Assessment & Plan Note (Signed)
Developed chest tightness while doing yard work over week ago. Since that time she has had episodic chest discomfort the longest lasting a couple of hours. Initially she did have nausea and he did radiate up into her neck. She is somewhat clouded in that she has a history of asthma and sometimes she's not sure if it's asthma versus her cardiac issues. Plan will be for elective Lexiscan Myoview and with those results we'll determine if cardiac catheterization is needed. I'll have her followup with me after the procedure on a daily.appearance in the office so we can make a plan with her.  She has nitroglycerin sublingual as needed but has not liked to take it if possible.

## 2013-04-28 NOTE — Assessment & Plan Note (Signed)
Stable, followed by PCP 

## 2013-04-28 NOTE — Assessment & Plan Note (Signed)
Proceed with nuclear stress test and possible cardiac catheterization if symptoms persist or if positive stress test.

## 2013-04-28 NOTE — Assessment & Plan Note (Signed)
No wheezes, again unsure of her chest pressure is related to asthma versus cardiac.

## 2013-05-01 ENCOUNTER — Ambulatory Visit (HOSPITAL_COMMUNITY)
Admission: RE | Admit: 2013-05-01 | Discharge: 2013-05-01 | Disposition: A | Discharge status: Home / Self Care | Payer: BC Managed Care – PPO | Source: Ambulatory Visit | Attending: Cardiovascular Disease | Admitting: Cardiovascular Disease

## 2013-05-01 DIAGNOSIS — I209 Angina pectoris, unspecified: Secondary | ICD-10-CM

## 2013-05-01 MED ORDER — AMINOPHYLLINE 25 MG/ML IV SOLN
75.0000 mg | Freq: Once | INTRAVENOUS | Status: AC
  Administered 2013-05-01: 75 mg via INTRAVENOUS

## 2013-05-01 MED ORDER — TECHNETIUM TC 99M SESTAMIBI GENERIC - CARDIOLITE
31.2000 | Freq: Once | INTRAVENOUS | Status: AC | PRN
  Administered 2013-05-01: 31.2 via INTRAVENOUS

## 2013-05-01 MED ORDER — REGADENOSON 0.4 MG/5ML IV SOLN
0.4000 mg | Freq: Once | INTRAVENOUS | Status: AC
  Administered 2013-05-01: 0.4 mg via INTRAVENOUS

## 2013-05-01 MED ORDER — TECHNETIUM TC 99M SESTAMIBI GENERIC - CARDIOLITE
10.6000 | Freq: Once | INTRAVENOUS | Status: AC | PRN
  Administered 2013-05-01: 11 via INTRAVENOUS

## 2013-05-01 NOTE — Procedures (Addendum)
[facility] [address] [place] [address] [place] [address] [phone]  Cardiology Nuclear Med Study  [name] is a 62 y.o. female     MRN : [id number]     DOB: December 05, 1950  Procedure Date: 05/01/2013  Nuclear Med Background Indication for Stress Test:  Stent Patency History:  Asthma, COPD and CAD;STENT/PTCA--05/26/2008 Cardiac Risk Factors: Family History - CAD, History of Smoking, Hypertension, IDDM Type 2, Lipids and Obesity  Symptoms:  Chest Pain, Dizziness, DOE, Fatigue, Light-Headedness and Palpitations   Nuclear Pre-Procedure Caffeine/Decaff Intake:  8:00pm NPO After: 6:00am   IV Site: R Hand  IV 0.9% NS with Angio Cath:  22g  Chest Size (in):  N/A  IV Started by: Emmit Pomfret, RN  Height: 5\' 2"  (1.575 m)  Cup Size: D  BMI:  Body mass index is 37.49 kg/(m^2). Weight:  205 lb (92.987 kg)   Tech Comments:  N/A    Nuclear Med Study 1 or 2 day study: 1 day  Stress Test Type:  Lexiscan  Order Authorizing Provider:  Obie Dredge   Resting Radionuclide: Technetium 39m Sestamibi  Resting Radionuclide Dose: 10.6 mCi   Stress Radionuclide:  Technetium 91m Sestamibi  Stress Radionuclide Dose: 31.2 mCi           Stress Protocol Rest HR: 60 Stress HR: 71  Rest BP: 141/60 Stress BP: 140/53  Exercise Time (min): n/a METS: n/a   Predicted Max HR: 156 bpm % Max HR: 45.51 bpm Rate Pressure Product: 21308  Dose of Adenosine (mg):  n/a Dose of Lexiscan: 0.4 mg  Dose of Atropine (mg): n/a Dose of Dobutamine: n/a mcg/kg/min (at max HR)  Stress Test Technologist: Esperanza Sheets, CCT Nuclear Technologist: Gonzella Lex, CNMT   Rest Procedure:  Myocardial perfusion imaging was performed at rest 45 minutes following the intravenous administration of Technetium 59m Sestamibi. Stress Procedure:  The patient received IV Lexiscan 0.4 mg over 15-seconds.  Technetium 61m Sestamibi injected at 30-seconds.  The patient experienced SOB,  Nausea and a headache; 75 mg IV Aminophylline was administered with resolution.  There were no significant changes with Lexiscan.  Quantitative spect images were obtained after a 45 minute delay.  Transient Ischemic Dilatation (Normal <1.22):  0.97 Lung/Heart Ratio (Normal <0.45):  0.23 QGS EDV: 93 ml QGS ESV: 31 ml LV Ejection Fraction: 67%  Rest ECG: NSR - Normal EKG  Stress ECG: No significant change from baseline ECG  QPS Raw Data Images:  Normal; no motion artifact; normal heart/lung ratio. Stress Images:  There is decreased uptake in the inferior wall. Rest Images:  There is decreased uptake in the inferior wall. Subtraction (SDS):  There is a fixed inferior defect that is most consistent with diaphragmatic attenuation.  Impression Exercise Capacity:  Lexiscan with no exercise. BP Response:  Normal blood pressure response. Clinical Symptoms:  There is dyspnea. ECG Impression:  No significant ECG changes with Lexiscan. Comparison with Prior Nuclear Study: No significant change from previous study  Overall Impression:  Low risk stress nuclear study with fixed inferior bowel attenuation artifact. Review of her previous study shows a similar defect.  LV Wall Motion:  NL LV Function; NL Wall Motion; 67%  Chrystie Nose, MD, [facility] Board Certified in Nuclear Cardiology Attending Cardiologist [facility]  Chrystie Nose, MD  05/01/2013 12:16 PM

## 2013-05-04 ENCOUNTER — Encounter: Payer: Self-pay | Admitting: *Deleted

## 2013-05-13 ENCOUNTER — Encounter: Payer: Self-pay | Admitting: Cardiology

## 2013-05-13 ENCOUNTER — Ambulatory Visit (INDEPENDENT_AMBULATORY_CARE_PROVIDER_SITE_OTHER): Payer: BC Managed Care – PPO | Admitting: Cardiology

## 2013-05-13 VITALS — BP 142/52 | HR 64 | Ht 62.0 in | Wt 206.6 lb

## 2013-05-13 DIAGNOSIS — I10 Essential (primary) hypertension: Secondary | ICD-10-CM

## 2013-05-13 DIAGNOSIS — I2510 Atherosclerotic heart disease of native coronary artery without angina pectoris: Secondary | ICD-10-CM

## 2013-05-13 DIAGNOSIS — I208 Other forms of angina pectoris: Secondary | ICD-10-CM

## 2013-05-13 MED ORDER — ISOSORBIDE MONONITRATE ER 30 MG PO TB24: 30.0000 mg | ORAL_TABLET | Freq: Every day | ORAL | Status: DC

## 2013-05-13 NOTE — Assessment & Plan Note (Signed)
Stable nuc study, have increased Imdur to 30 mg.  May have chest wall component to her pain.

## 2013-05-13 NOTE — Progress Notes (Signed)
05/13/2013   PCP: Marga Melnick, MD   Chief Complaint  Patient presents with  . Follow-up    Nuclear study results.  Occas slight chest discomfort. No SOB, Edema or dizziness.    Primary Cardiologist: Dr. Allyson Sabal  HPI:  Pt is here today for results of her lexiscan myoview.  Low risk study EF 67%.  She does continue with constant chest pressure that is always present and is not new.  In addition if she pushes her self physically she develops increased pressure and with continued activity it will increase to radiation into her throat.  She is limiting her activities mildly stopping to rest with increase of pressure.  Last cath 2012 with non obstructive disease and patent previous stent.   Discussed her symptoms and stress test with Dr. Allyson Sabal and will adjust her meds and she will then follow up with Dr. Allyson Sabal.  She has a history of stent, drug-eluting stent, to the distal circumflex posterior lateral branch October of 2009 with residual disease in the PDA, normal LV function. A Myoview was performed September 2009 without ischemia. In 2012 she had chest discomfort she is placed on into her and a stress test w as done but not obtained because of increasing chest tightness and dyspnea. Cardiac cath revealed a patent stent she did have residual 50% distal PDA lesion normal LV function and 50-60% RCA stenosis. She also has 30% left main. Her previous lab work LDL was 63 and HDL of 45. She is diabetic.      Allergies  Allergen Reactions  . Benadryl [Diphenhydramine Hcl] Other (See Comments)    Restless leg  . Clopidogrel Bisulfate     Nausea & pain  . Contrast Media [Iodinated Diagnostic Agents] Palpitations    Rapid heart rate, hot    Current Outpatient Prescriptions  Medication Sig Dispense Refill  . albuterol (PROVENTIL,VENTOLIN) 90 MCG/ACT inhaler Inhale 2 puffs into the lungs every 6 (six) hours as needed. For shortness of breath      . amLODipine (NORVASC) 10 MG tablet TAKE  (1) TABLET DAILY FOR HIGH BLOOD PRESSURE.  30 tablet  5  . ascorbic acid (VITAMIN C) 1000 MG tablet Take 1,000 mg by mouth 2 (two) times daily.        [name] aspirin 325 MG tablet Take 325 mg by mouth every morning.       [name] atenolol (TENORMIN) 50 MG tablet TAKE 1 TABLET ONCE DAILY.  30 tablet  4  . bimatoprost (LUMIGAN) 0.01 % SOLN Place 1 drop into both eyes at bedtime.       . budesonide-formoterol (SYMBICORT) 160-4.5 MCG/ACT inhaler Inhale 2 puffs into the lungs 2 (two) times daily as needed. FOR SHORTNESS OF BREATH  1 Inhaler  5  . CALCIUM-MAG-VIT C-VIT D PO Take 2 tablets by mouth daily.        . CELEBREX 200 MG capsule Take 200 mg by mouth 2 (two) times daily.       [name] estradiol (ESTRACE) 0.5 MG tablet Take 0.5 mg by mouth every morning.       [name] glimepiride (AMARYL) 4 MG tablet Take 4 mg by mouth daily before breakfast.      . guaiFENesin (MUCINEX) 600 MG 12 hr tablet Take 600 mg by mouth 2 (two) times daily.      [name] LORazepam (ATIVAN) 0.5 MG tablet Take 1 mg by mouth at bedtime. 1-2 TABS BY MOUTH IN THE EVENING      .  Melatonin 5 MG TABS Take 1 tablet by mouth at bedtime.        . mometasone (ELOCON) 0.1 % ointment Apply topically 2 (two) times daily.  45 g  1  . Multiple Vitamins-Iron (MULTIVITAMINS WITH IRON) TABS Take 1 tablet by mouth 2 (two) times daily.        . mupirocin ointment (BACTROBAN) 2 % USE ONCE DAILY AS DIRECTED.  22 g  0  . omega-3 acid ethyl esters (LOVAZA) 1 G capsule Take 1 g by mouth daily.        [name] omeprazole (PRILOSEC) 20 MG capsule Take 20 mg by mouth daily.        Letta Pate DELICA LANCETS FINE MISC 1 Units by Does not apply route as directed.  100 each  3  . OVER THE COUNTER MEDICATION Take 1 packet by mouth daily. SPIRU-TEIN SUPPLEMENT       . PARoxetine (PAXIL) 10 MG tablet Take 10 mg by mouth every morning. 2 1/2 TAB BY MOUTH DAILY      . prasugrel (EFFIENT) 10 MG TABS tablet Take 1 tablet (10 mg total) by mouth at bedtime.  30 tablet  5  . quinapril (ACCUPRIL) 40  MG tablet TAKE 1 TABLET ONCE DAILY.  90 tablet  3  . rosuvastatin (CRESTOR) 10 MG tablet Take 1 tablet (10 mg total) by mouth at bedtime.  30 tablet  5  . senna (SENOKOT) 8.6 MG TABS Take 1 tablet by mouth daily.        . sitaGLIPtin-metformin (JANUMET) 50-1000 MG per tablet Take 0.5 tablet (500mg ) with morning meal daily. Take 1 tablet (1,000mg ) with evening meal daily.      . isosorbide mononitrate (IMDUR) 30 MG 24 hr tablet Take 1 tablet (30 mg total) by mouth daily.  30 tablet  6   No current facility-administered medications for this visit.    Past Medical History  Diagnosis Date  . Arthritis   . Asthma   . GERD (gastroesophageal reflux disease)   . Heart murmur   . Hyperlipidemia   . Hypertension   . Pneumonia 2008  . Goiter   . Diabetes mellitus     Type 2  . Anemia, unspecified   . Chronic low back pain   . Polycystic ovary disease     Hysterectomy in 1982 for this  . Bell's palsy     Facial nerve decompression in 2001  . Shortness of breath dyspnea     ECHO 02/26/08 showed only minor abnormalities  . Coronary artery disease     Myoview 04/12/11 was entirely normal. ECHO 02/26/08 showed only minor abnormalities. Stenting 05/26/08 of her posterolateral branch to the left circumflex coronary artery. Used a 2.5x40mm Taxus Monorail stent.myoview 2014 was without ischemia  . COPD with asthma   . Glaucoma   . Spinal stenosis   . History of nuclear stress test 2012; 2014    lexiscan; normal pattern of perfusion; normal, low risk scan     Past Surgical History  Procedure Laterality Date  . Abdominal hysterectomy  1982    & BSO; for polycystic ovary disease  . Facial nerve decompression  2001/2002    bells palsy   . Laparoscopic cholecystectomy  06/15/2011     Dr Derrell Lolling  . Colonoscopy      last 2009; Dr Matthias Hughs; due 2019  . Upper gi endoscopy  2009    negative  . Coronary angioplasty  05/26/2008    Stenting of her posterolateral  branch to the left circumflex coronary  artery. Used a 2.5x31mm Taxus Monorail stent.  . Transthoracic echocardiogram  02/26/2008    LV systolic function normal with mild conc LVH; LA mildly dilated; trace MR/TR    ZOX:WRUEAVW:UJ colds or fevers, no weight changes Skin:+  Rash on lower ext. no ulcers CV:see HPI PUL:see HPI MS:no joint pain, no claudication  PHYSICAL EXAM BP 142/52  Pulse 64  Ht 5\' 2"  (1.575 m)  Wt 206 lb 9.6 oz (93.713 kg)  BMI 37.78 kg/m2 General:Pleasant affect, NAD Skin:Warm and dry, brisk capillary refill HEENT:normocephalic, sclera clear, mucus membranes moist Heart:S1S2 RRR without murmur, gallup, rub or click Lungs:clear without rales, rhonchi, or wheezes Ext:no lower ext edema, 2+ pedal pulses, 2+ radial pulses, rash on lower legs at ankles. improving Neuro:alert and oriented, MAE, follows commands, + facial symmetry   ASSESSMENT AND PLAN Angina decubitus Continues with chest pressure, constant, mild chest wall pressure, does increase with exertion.  Discussed with Dr. Allyson Sabal, will increase Imdur and then will have her follow up in 4-6 weeks with Dr. Allyson Sabal to eval for improvement.  Nuc stress test was negative for ischemia done 05/01/13.  CAD (coronary artery disease), hx of LCX stent DES 2009,  last cath 2012 50-60% RCA stenosis, patent stent, 30 % LM. Stable nuc study, have increased Imdur to 30 mg.  May have chest wall component to her pain.  HYPERTENSION stable

## 2013-05-13 NOTE — Assessment & Plan Note (Signed)
stable °

## 2013-05-13 NOTE — Patient Instructions (Signed)
Increase Isosorbide to 30 mg daily.  Call if increasing or more severe chest pain.  Follow up with Dr. Allyson Sabal in 4-6 weeks to see how pressure is on increased meds.

## 2013-05-13 NOTE — Assessment & Plan Note (Signed)
Continues with chest pressure, constant, mild chest wall pressure, does increase with exertion.  Discussed with Dr. Allyson Sabal, will increase Imdur and then will have her follow up in 4-6 weeks with Dr. Allyson Sabal to eval for improvement.  Nuc stress test was negative for ischemia done 05/01/13.

## 2013-05-21 ENCOUNTER — Telehealth: Payer: Self-pay | Admitting: Cardiology

## 2013-05-21 NOTE — Telephone Encounter (Signed)
Spoke to patient. She states she was unable to take the the full dose of Isosorbide MN - develop a headache and dizziness She wanted to know if she can take 1/2 the tablet twice a day.  Informed her that will be okay.  She states that she may want to come in sooner for her appointment.she will call back to schedule she was not able to talk anymore

## 2013-05-21 NOTE — Telephone Encounter (Signed)
She saw [name] and [name] changed med and told her to call if she had any problems  She is saying the change is not working  Needs someone to call

## 2013-05-22 ENCOUNTER — Telehealth: Payer: Self-pay | Admitting: *Deleted

## 2013-05-22 NOTE — Telephone Encounter (Signed)
Letter mailed asking patient to check my chart for test results

## 2013-06-05 ENCOUNTER — Ambulatory Visit: Payer: BC Managed Care – PPO | Admitting: Cardiovascular Disease

## 2013-06-10 ENCOUNTER — Ambulatory Visit: Payer: BC Managed Care – PPO | Admitting: Cardiovascular Disease

## 2013-06-16 ENCOUNTER — Ambulatory Visit (INDEPENDENT_AMBULATORY_CARE_PROVIDER_SITE_OTHER): Payer: BC Managed Care – PPO | Admitting: Family Medicine

## 2013-06-16 VITALS — BP 128/60 | HR 74 | Temp 98.2°F | Resp 16 | Ht 62.25 in | Wt 206.6 lb

## 2013-06-16 DIAGNOSIS — R350 Frequency of micturition: Secondary | ICD-10-CM

## 2013-06-16 DIAGNOSIS — R21 Rash and other nonspecific skin eruption: Secondary | ICD-10-CM

## 2013-06-16 DIAGNOSIS — N390 Urinary tract infection, site not specified: Secondary | ICD-10-CM

## 2013-06-16 LAB — POCT URINALYSIS DIPSTICK
Bilirubin, UA: NEGATIVE
Glucose, UA: NEGATIVE
Ketones, UA: NEGATIVE
Nitrite, UA: NEGATIVE
Protein, UA: 100
Spec Grav, UA: <=1.005
Urobilinogen, UA: 0.2
pH, UA: 5.5

## 2013-06-16 LAB — POCT UA - MICROSCOPIC ONLY
Casts, Ur, LPF, POC: NEGATIVE
Crystals, Ur, HPF, POC: NEGATIVE
Mucus, UA: NEGATIVE
Yeast, UA: NEGATIVE

## 2013-06-16 MED ORDER — CIPROFLOXACIN HCL 500 MG PO TABS: 500.0000 mg | ORAL_TABLET | Freq: Two times a day (BID) | ORAL | Status: DC

## 2013-06-16 MED ORDER — CLOTRIMAZOLE-BETAMETHASONE 1-0.05 % EX CREA: 1.0000 "application " | TOPICAL_CREAM | Freq: Two times a day (BID) | CUTANEOUS | Status: DC

## 2013-06-16 NOTE — Progress Notes (Signed)
Increased frequency of urination - Plan: POCT UA - Microscopic Only, POCT urinalysis dipstick  Signed, Elvina Sidle, MD     Patient ID: HAREEM SUROWIEC MRN: [id number], DOB: 06/23/1951, 62 y.o. Date of Encounter: 06/16/2013, 8:01 PM  Primary Physician: Marga Melnick, MD  Chief Complaint:  Chief Complaint  Patient presents with  . Increased Urination    X 3 days    HPI: 62 y.o. year old female presents with 3 day history of lower abdominal squeezing pain Last UTI was 10 months ago No hematuria LMP:  No sick contacts, recent antibiotics, or recent travels.   No vaginal discharge, back pain, fever  Past Medical History  Diagnosis Date  . Arthritis   . Asthma   . GERD (gastroesophageal reflux disease)   . Heart murmur   . Hyperlipidemia   . Hypertension   . Pneumonia 2008  . Goiter   . Diabetes mellitus     Type 2  . Anemia, unspecified   . Chronic low back pain   . Polycystic ovary disease     Hysterectomy in 1982 for this  . Bell's palsy     Facial nerve decompression in 2001  . Shortness of breath dyspnea     ECHO 02/26/08 showed only minor abnormalities  . Coronary artery disease     Myoview 04/12/11 was entirely normal. ECHO 02/26/08 showed only minor abnormalities. Stenting 05/26/08 of her posterolateral branch to the left circumflex coronary artery. Used a 2.5x71mm Taxus Monorail stent.myoview 2014 was without ischemia  . COPD with asthma   . Glaucoma   . Spinal stenosis   . History of nuclear stress test 2012; 2014    lexiscan; normal pattern of perfusion; normal, low risk scan      Home Meds: Prior to Admission medications   Medication Sig Start Date End Date Taking? Authorizing Provider  albuterol (PROVENTIL,VENTOLIN) 90 MCG/ACT inhaler Inhale 2 puffs into the lungs every 6 (six) hours as needed. For shortness of breath   Yes Historical Provider, MD  amLODipine (NORVASC) 10 MG tablet TAKE (1) TABLET DAILY FOR HIGH BLOOD PRESSURE. 03/19/13  Yes Pecola Lawless, MD  ascorbic acid (VITAMIN C) 1000 MG tablet Take 1,000 mg by mouth 2 (two) times daily.     Yes Historical Provider, MD  aspirin 325 MG tablet Take 325 mg by mouth every morning.    Yes Historical Provider, MD  atenolol (TENORMIN) 50 MG tablet TAKE 1 TABLET ONCE DAILY. 04/17/13  Yes Runell Gess, MD  bimatoprost (LUMIGAN) 0.01 % SOLN Place 1 drop into both eyes at bedtime.    Yes Historical Provider, MD  budesonide-formoterol (SYMBICORT) 160-4.5 MCG/ACT inhaler Inhale 2 puffs into the lungs 2 (two) times daily as needed. FOR SHORTNESS OF BREATH 03/24/12  Yes Pecola Lawless, MD  CALCIUM-MAG-VIT C-VIT D PO Take 2 tablets by mouth daily.     Yes Historical Provider, MD  CELEBREX 200 MG capsule Take 200 mg by mouth 2 (two) times daily.  04/23/11  Yes Historical Provider, MD  estradiol (ESTRACE) 0.5 MG tablet Take 0.5 mg by mouth every morning.  04/23/11  Yes Historical Provider, MD  guaiFENesin (MUCINEX) 600 MG 12 hr tablet Take 600 mg by mouth 2 (two) times daily.   Yes Historical Provider, MD  isosorbide mononitrate (IMDUR) 30 MG 24 hr tablet Take 1 tablet (30 mg total) by mouth daily. 05/13/13  Yes Nada Boozer, NP  LORazepam (ATIVAN) 0.5 MG tablet Take 1 mg by mouth at  bedtime. 1-2 TABS BY MOUTH IN THE EVENING 04/23/11  Yes Historical Provider, MD  Melatonin 5 MG TABS Take 1 tablet by mouth at bedtime.     Yes Historical Provider, MD  mometasone (ELOCON) 0.1 % ointment Apply topically 2 (two) times daily. 05/05/12  Yes Pecola Lawless, MD  Multiple Vitamins-Iron (MULTIVITAMINS WITH IRON) TABS Take 1 tablet by mouth 2 (two) times daily.     Yes Historical Provider, MD  mupirocin ointment (BACTROBAN) 2 % USE ONCE DAILY AS DIRECTED. 10/03/12  Yes Pecola Lawless, MD  omega-3 acid ethyl esters (LOVAZA) 1 G capsule Take 1 g by mouth daily.     Yes Historical Provider, MD  omeprazole (PRILOSEC) 20 MG capsule Take 20 mg by mouth daily.     Yes Historical Provider, MD  [facility] DELICA LANCETS FINE  MISC 1 Units by Does not apply route as directed. 10/05/11  Yes Sheliah Hatch, MD  OVER THE COUNTER MEDICATION Take 1 packet by mouth daily. SPIRU-TEIN SUPPLEMENT    Yes Historical Provider, MD  PARoxetine (PAXIL) 10 MG tablet Take 10 mg by mouth every morning. 2 1/2 TAB BY MOUTH DAILY   Yes Historical Provider, MD  prasugrel (EFFIENT) 10 MG TABS tablet Take 1 tablet (10 mg total) by mouth at bedtime. 03/19/13  Yes Runell Gess, MD  quinapril (ACCUPRIL) 40 MG tablet TAKE 1 TABLET ONCE DAILY. 07/22/12  Yes Pecola Lawless, MD  rosuvastatin (CRESTOR) 10 MG tablet Take 1 tablet (10 mg total) by mouth at bedtime. 02/17/13  Yes Runell Gess, MD  senna (SENOKOT) 8.6 MG TABS Take 1 tablet by mouth daily.     Yes Historical Provider, MD  sitaGLIPtin-metformin (JANUMET) 50-1000 MG per tablet Take 0.5 tablet (500mg ) with morning meal daily. Take 1 tablet (1,000mg ) with evening meal daily. 01/23/13  Yes Pecola Lawless, MD  glimepiride (AMARYL) 4 MG tablet Take 4 mg by mouth daily before breakfast.    Historical Provider, MD    Allergies:  Allergies  Allergen Reactions  . Benadryl [Diphenhydramine Hcl] Other (See Comments)    Restless leg  . Clopidogrel Bisulfate     Nausea & pain  . Contrast Media [Iodinated Diagnostic Agents] Palpitations    Rapid heart rate, hot    History   Social History  . Marital Status: Single    Spouse Name: N/A    Number of Children: 2  . Years of Education: master's   Occupational History  . Speech Pathologist    Social History Main Topics  . Smoking status: Former Smoker -- 1.50 packs/day for 30 years    Types: Cigarettes    Quit date: 08/13/1993  . Smokeless tobacco: Never Used  . Alcohol Use: No  . Drug Use: No  . Sexual Activity: Not on file   Other Topics Concern  . Not on file   Social History Narrative  . No narrative on file     Review of Systems: Constitutional: negative for chills, fever, night sweats or weight  changes Cardiovascular: negative for chest pain or palpitations Respiratory: negative for hemoptysis, wheezing, or shortness of breath Abdominal: negative for abdominal pain, nausea, vomiting or diarrhea Dermatological: negative for rash Neurologic: negative for headache   Physical Exam: Blood pressure 128/60, pulse 74, temperature 98.2 F (36.8 C), temperature source Oral, resp. rate 16, height 5' 2.25" (1.581 m), weight 206 lb 9.6 oz (93.713 kg), SpO2 98.00%., Body mass index is 37.49 kg/(m^2). General: Well developed, well nourished, in  no acute distress. Head: Normocephalic, atraumatic, eyes without discharge, sclera non-icteric, nares are congested. Bilateral auditory canals clear, TM's are without perforation, pearly grey with reflective cone of light bilaterally. Serous effusion bilaterally behind TM's. Maxillary sinus TTP. Oral cavity moist, dentition normal. Posterior pharynx with post nasal drip and mild erythema. No peritonsillar abscess or tonsillar exudate. Neck: Supple. No thyromegaly. Full ROM. No lymphadenopathy. Lungs: Coarse breath sounds bilaterally without Clear bilaterally to auscultation without wheezes, rales, or rhonchi. Breathing is unlabored. Heart: RRR with S1 S2. No murmurs, rubs, or gallops appreciated. Abdomen: Soft, non-tender, non-distended with normoactive bowel sounds. No hepatosplenomegaly. No rebound/guarding. No obvious abdominal masses. McBurney's, Rovsing's, Iliopsoas, and table jar all negative. Msk:  Strength and tone normal for age. Extremities: No clubbing or cyanosis. No edema. Neuro: Alert and oriented X 3. Moves all extremities spontaneously. CNII-XII grossly in tact. Psych:  Responds to questions appropriately with a normal affect.   Results for orders placed in visit on 06/16/13  POCT UA - MICROSCOPIC ONLY      Result Value Range   WBC, Ur, HPF, POC tntc     RBC, urine, microscopic 1-4     Bacteria, U Microscopic 2+     Mucus, UA neg      Epithelial cells, urine per micros 0-5     Crystals, Ur, HPF, POC neg     Casts, Ur, LPF, POC neg     Yeast, UA neg    POCT URINALYSIS DIPSTICK      Result Value Range   Color, UA yellow     Clarity, UA cloudy     Glucose, UA neg     Bilirubin, UA neg     Ketones, UA neg     Spec Grav, UA <=1.005     Blood, UA moderate     pH, UA 5.5     Protein, UA 100     Urobilinogen, UA 0.2     Nitrite, UA neg     Leukocytes, UA large (3+)     Patient's also had a couple months rash on her lower legs overlying the Achilles regions and both malleolus. The right side shows an annular lesion with central clearing   ASSESSMENT AND PLAN:  62 y.o. year old female with UTI and fungal rash Increased frequency of urination - Plan: POCT UA - Microscopic Only, POCT urinalysis dipstick, ciprofloxacin (CIPRO) 500 MG tablet  UTI (urinary tract infection) - Plan: ciprofloxacin (CIPRO) 500 MG tablet  Rash and nonspecific skin eruption - Plan: clotrimazole-betamethasone (LOTRISONE) cream   - -Mucinex -Tylenol/Motrin prn -Rest/fluids -RTC precautions -RTC 3-5 days if no improvement  Signed, Elvina Sidle, MD 06/16/2013 8:01 PM

## 2013-06-16 NOTE — Patient Instructions (Signed)
Urinary Tract Infection  Urinary tract infections (UTIs) can develop anywhere along your urinary tract. Your urinary tract is your body's drainage system for removing wastes and extra water. Your urinary tract includes two kidneys, two ureters, a bladder, and a urethra. Your kidneys are a pair of bean-shaped organs. Each kidney is about the size of your fist. They are located below your ribs, one on each side of your spine.  CAUSES  Infections are caused by microbes, which are microscopic organisms, including fungi, viruses, and bacteria. These organisms are so small that they can only be seen through a microscope. Bacteria are the microbes that most commonly cause UTIs.  SYMPTOMS   Symptoms of UTIs may vary by age and gender of the patient and by the location of the infection. Symptoms in young women typically include a frequent and intense urge to urinate and a painful, burning feeling in the bladder or urethra during urination. Older women and men are more likely to be tired, shaky, and weak and have muscle aches and abdominal pain. A fever may mean the infection is in your kidneys. Other symptoms of a kidney infection include pain in your back or sides below the ribs, nausea, and vomiting.  DIAGNOSIS  To diagnose a UTI, your caregiver will ask you about your symptoms. Your caregiver also will ask to provide a urine sample. The urine sample will be tested for bacteria and white blood cells. White blood cells are made by your body to help fight infection.  TREATMENT   Typically, UTIs can be treated with medication. Because most UTIs are caused by a bacterial infection, they usually can be treated with the use of antibiotics. The choice of antibiotic and length of treatment depend on your symptoms and the type of bacteria causing your infection.  HOME CARE INSTRUCTIONS   If you were prescribed antibiotics, take them exactly as your caregiver instructs you. Finish the medication even if you feel better after you  have only taken some of the medication.   Drink enough water and fluids to keep your urine clear or pale yellow.   Avoid caffeine, tea, and carbonated beverages. They tend to irritate your bladder.   Empty your bladder often. Avoid holding urine for long periods of time.   Empty your bladder before and after sexual intercourse.   After a bowel movement, women should cleanse from front to back. Use each tissue only once.  SEEK MEDICAL CARE IF:    You have back pain.   You develop a fever.   Your symptoms do not begin to resolve within 3 days.  SEEK IMMEDIATE MEDICAL CARE IF:    You have severe back pain or lower abdominal pain.   You develop chills.   You have nausea or vomiting.   You have continued burning or discomfort with urination.  MAKE SURE YOU:    Understand these instructions.   Will watch your condition.   Will get help right away if you are not doing well or get worse.  Document Released: 05/09/2005 Document Revised: 01/29/2012 Document Reviewed: 09/07/2011  [employer] Patient Information 2014 [employer]

## 2013-06-18 ENCOUNTER — Other Ambulatory Visit: Payer: Self-pay

## 2013-06-25 ENCOUNTER — Ambulatory Visit (INDEPENDENT_AMBULATORY_CARE_PROVIDER_SITE_OTHER): Payer: BC Managed Care – PPO | Admitting: Physician Assistant

## 2013-06-25 VITALS — BP 140/50 | HR 64 | Temp 97.5°F | Resp 16 | Ht 62.0 in | Wt 205.0 lb

## 2013-06-25 DIAGNOSIS — J209 Acute bronchitis, unspecified: Secondary | ICD-10-CM

## 2013-06-25 MED ORDER — LEVALBUTEROL TARTRATE 45 MCG/ACT IN AERO: 1.0000 | INHALATION_SPRAY | Freq: Four times a day (QID) | RESPIRATORY_TRACT | Status: DC | PRN

## 2013-06-25 MED ORDER — AZITHROMYCIN 250 MG PO TABS: ORAL_TABLET | ORAL | Status: AC

## 2013-06-25 MED ORDER — HYDROCOD POLST-CHLORPHEN POLST 10-8 MG/5ML PO LQCR: 5.0000 mL | Freq: Two times a day (BID) | ORAL | Status: AC

## 2013-06-25 NOTE — Progress Notes (Signed)
   [address] [place] [address]   Phone [phone]  Subjective:    Patient ID: [name], female    DOB: Sep 15, 1950, 62 y.o.   MRN: [id number]  HPI Pt presents to clinic with cough and green sputum.  She is having no SOB or wheezing. Her chest is burning and today she felt like she could have used her albuterol inhaler but the side effects are so bad she does not use it until she cannot breath. She uses Mucinex daily to keep her mucus thin.  Review of Systems  Constitutional: Positive for fatigue. Negative for fever and chills.  HENT: Positive for congestion, postnasal drip and rhinorrhea.   Respiratory: Positive for cough (green sputum - mostly in the am).   Musculoskeletal: Negative for myalgias.  Neurological: Negative for headaches.       Objective:   Physical Exam  Vitals reviewed. Constitutional: She is oriented to person, place, and time. She appears well-developed and well-nourished.  HENT:  Head: Normocephalic and atraumatic.  Right Ear: Hearing, tympanic membrane, external ear and ear canal normal.  Left Ear: Hearing, tympanic membrane, external ear and ear canal normal.  Nose: Nose normal.  Mouth/Throat: Uvula is midline, oropharynx is clear and moist and mucous membranes are normal.  Eyes: Conjunctivae are normal.  Neck: Normal range of motion.  Cardiovascular: Normal rate, regular rhythm and normal heart sounds.   No murmur heard. Pulmonary/Chest: Effort normal and breath sounds normal. She has no wheezes.  Expiratory >>inspiratory  Lymphadenopathy:    She has no cervical adenopathy.  Neurological: She is alert and oriented to person, place, and time.  Skin: Skin is warm and dry.  Psychiatric: She has a normal mood and affect. Her behavior is normal. Judgment and thought content normal.       Assessment & Plan:  Acute bronchitis - Plan: azithromycin (ZITHROMAX Z-PAK) 250 MG tablet, levalbuterol (XOPENEX HFA) 45 MCG/ACT inhaler,  chlorpheniramine-HYDROcodone (TUSSIONEX PENNKINETIC ER) 10-8 MG/5ML LQCR  Pt is not aware of a COPD diagnosis but these seems like a COPD flair.  Will cover with abx and give her cough medications.  We will try her on Xopenex to see if she gets less side effects and therefore uses her rescue inhaler when she needs it.  She will increase her Mucinex to 600mg  tid because she cannot tolerate 1200mg  bid.  She will push fluids.  Benny Lennert PA-C 06/25/2013 8:35 PM

## 2013-06-26 ENCOUNTER — Ambulatory Visit (INDEPENDENT_AMBULATORY_CARE_PROVIDER_SITE_OTHER): Payer: BC Managed Care – PPO | Admitting: Cardiovascular Disease

## 2013-06-26 ENCOUNTER — Encounter: Payer: Self-pay | Admitting: Cardiovascular Disease

## 2013-06-26 VITALS — BP 160/72 | HR 80 | Ht 62.0 in | Wt 205.1 lb

## 2013-06-26 DIAGNOSIS — I10 Essential (primary) hypertension: Secondary | ICD-10-CM

## 2013-06-26 DIAGNOSIS — E785 Hyperlipidemia, unspecified: Secondary | ICD-10-CM

## 2013-06-26 DIAGNOSIS — I2510 Atherosclerotic heart disease of native coronary artery without angina pectoris: Secondary | ICD-10-CM

## 2013-06-26 NOTE — Patient Instructions (Signed)
Your physician wants you to follow-up in: 6 months with Nada Boozer NP and 1 year Dr Allyson Sabal. You will receive a reminder letter in the mail two months in advance. If you don't receive a letter, please call our office to schedule the follow-up appointment.

## 2013-06-26 NOTE — Progress Notes (Signed)
06/26/2013 [name]   1951-01-14  [id number]  Primary Physician Marga Melnick, MD Primary Cardiologist: Runell Gess MD Roseanne Reno   HPI: The patient is a very pleasant 62 year old moderately overweight divorced Caucasian female, mother of 2 adopted children, ages 34 and 37, who is formerly a patient of Dr. Truett Perna. I saw her 1 year ago. 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 Her risk factors include former tobacco abuse, having quit 18 years ago, diabetes, hypertension, hyperlipidemia, and a positive family history for heart disease. She has adult-onset asthma as well. Dr. Elsie Lincoln stented her distal circumflex posterolateral branch, May 26, 2008, with some residual disease in the PDA branch, and normal LV function. Myoview performed May 06, 2008, was not ischemic. I saw her summer of last year, at which time she was complaining of chest pain, for which I prescribed Imdur, which she never filled. I suggested a stress test, which was never obtained. Because of her recurrent chest tightness and increasing dyspnea with radiation to her left neck, I performed cardiac catheterization on her, April 20, 2011, revealing a patent stent, 50% distal PDA lesion, with normal LV function. I thought her disease was stable and had not progressed, and recommended medical therapy. She has had no recurrent chest pain since I last saw her. She has some mild dyspnea. Recent lab work /2/13 revealed a total cholesterol of 129, LDL of 63, and HDL of 45. She saw Nada Boozer in the office 05/13/13 with chest pain. A subsequent Myoview stress test was entirely normal. Her pain has since resolved.    Current Outpatient Prescriptions  Medication Sig Dispense Refill  . amLODipine (NORVASC) 10 MG tablet TAKE (1) TABLET DAILY FOR HIGH BLOOD PRESSURE.  30 tablet  5  . ascorbic acid (VITAMIN C) 1000 MG tablet Take 1,000 mg by mouth 2 (two) times daily.        [name]  aspirin 325 MG tablet Take 325 mg by mouth every morning.       [name] atenolol (TENORMIN) 50 MG tablet TAKE 1 TABLET ONCE DAILY.  30 tablet  4  . azithromycin (ZITHROMAX Z-PAK) 250 MG tablet Use as directed  6 each  0  . bimatoprost (LUMIGAN) 0.01 % SOLN Place 1 drop into both eyes at bedtime.       . budesonide-formoterol (SYMBICORT) 160-4.5 MCG/ACT inhaler Inhale 2 puffs into the lungs 2 (two) times daily as needed. FOR SHORTNESS OF BREATH  1 Inhaler  5  . CALCIUM-MAG-VIT C-VIT D PO Take 2 tablets by mouth daily.        . chlorpheniramine-HYDROcodone (TUSSIONEX PENNKINETIC ER) 10-8 MG/5ML LQCR Take 5 mLs by mouth every 12 (twelve) hours.  70 mL  0  . clotrimazole-betamethasone (LOTRISONE) cream Apply 1 application topically as needed.      [name] estradiol (ESTRACE) 0.5 MG tablet Take 0.5 mg by mouth every morning.       [name] guaiFENesin (MUCINEX) 600 MG 12 hr tablet Take 600 mg by mouth 2 (two) times daily.      . isosorbide mononitrate (IMDUR) 30 MG 24 hr tablet Take 1 tablet (30 mg total) by mouth daily.  30 tablet  6  . LORazepam (ATIVAN) 0.5 MG tablet Take 1 mg by mouth at bedtime. 1-2 TABS BY MOUTH IN THE EVENING      . Melatonin 5 MG TABS Take 1 tablet by mouth at bedtime.        . Multiple  Vitamins-Iron (MULTIVITAMINS WITH IRON) TABS Take 1 tablet by mouth 2 (two) times daily.        . mupirocin ointment (BACTROBAN) 2 % USE ONCE DAILY AS DIRECTED.  22 g  0  . omega-3 acid ethyl esters (LOVAZA) 1 G capsule Take 1 g by mouth daily.        [name] omeprazole (PRILOSEC) 20 MG capsule Take 20 mg by mouth daily.        Letta Pate DELICA LANCETS FINE MISC 1 Units by Does not apply route as directed.  100 each  3  . OVER THE COUNTER MEDICATION Take 1 packet by mouth daily. SPIRU-TEIN SUPPLEMENT       . PARoxetine (PAXIL) 10 MG tablet Take 10 mg by mouth every morning. 2 1/2 TAB BY MOUTH DAILY      . prasugrel (EFFIENT) 10 MG TABS tablet Take 1 tablet (10 mg total) by mouth at bedtime.  30 tablet  5  . quinapril  (ACCUPRIL) 40 MG tablet TAKE 1 TABLET ONCE DAILY.  90 tablet  3  . rosuvastatin (CRESTOR) 10 MG tablet Take 1 tablet (10 mg total) by mouth at bedtime.  30 tablet  5  . senna (SENOKOT) 8.6 MG TABS Take 1 tablet by mouth daily.        . sitaGLIPtin-metformin (JANUMET) 50-1000 MG per tablet Take 0.5 tablet (500mg ) with morning meal daily. Take 1 tablet (1,000mg ) with evening meal daily.      [name] albuterol (PROVENTIL,VENTOLIN) 90 MCG/ACT inhaler Inhale 2 puffs into the lungs every 6 (six) hours as needed. For shortness of breath      . CELEBREX 200 MG capsule Take 200 mg by mouth 2 (two) times daily.       [name] levalbuterol (XOPENEX HFA) 45 MCG/ACT inhaler Inhale 1-2 puffs into the lungs every 6 (six) hours as needed for wheezing.  1 Inhaler  1   No current facility-administered medications for this visit.    Allergies  Allergen Reactions  . Benadryl [Diphenhydramine Hcl] Other (See Comments)    Restless leg  . Clopidogrel Bisulfate     Nausea & pain  . Contrast Media [Iodinated Diagnostic Agents] Palpitations    Rapid heart rate, hot    History   Social History  . Marital Status: Single    Spouse Name: N/A    Number of Children: 2  . Years of Education: master's   Occupational History  . Speech Pathologist    Social History Main Topics  . Smoking status: Former Smoker -- 1.50 packs/day for 30 years    Types: Cigarettes    Quit date: 08/13/1993  . Smokeless tobacco: Never Used  . Alcohol Use: No  . Drug Use: No  . Sexual Activity: Not on file   Other Topics Concern  . Not on file   Social History Narrative  . No narrative on file     Review of Systems: General: negative for chills, fever, night sweats or weight changes.  Cardiovascular: negative for chest pain, dyspnea on exertion, edema, orthopnea, palpitations, paroxysmal nocturnal dyspnea or shortness of breath Dermatological: negative for rash Respiratory: negative for cough or wheezing Urologic: negative for  hematuria Abdominal: negative for nausea, vomiting, diarrhea, bright red blood per rectum, melena, or hematemesis Neurologic: negative for visual changes, syncope, or dizziness All other systems reviewed and are otherwise negative except as noted above.    Blood pressure 160/72, pulse 80, height 5\' 2"  (1.575 m), weight 205 lb 1.6 oz (93.033 kg).  General appearance: alert and no distress Neck: no adenopathy, no JVD, supple, symmetrical, trachea midline, thyroid not enlarged, symmetric, no tenderness/mass/nodules and bilateral carotid bruits Lungs: clear to auscultation bilaterally Heart: soft outflow tract murmur Extremities: extremities normal, atraumatic, no cyanosis or edema  EKG not performed today  ASSESSMENT AND PLAN:   CAD (coronary artery disease), hx of LCX stent DES 2009,  last cath 2012 50-60% RCA stenosis, patent stent, 30 % LM. Patient had her distal distal posterior lateral branch stented by Dr. Elsie Lincoln 05/26/08 with residual disease in the PDA branch and normal LV function. Myoview performed 05/06/08 was nonischemic. She had repeat catheterization because of chest pain 957/12 which revealed a patent posterior lateral branch stent, 50-60% mid nondominant RCA with normal LV function. She saw Nada Boozer in the office and 05/13/13  with chest pain and had a Myoview stress test that was normal. She's had no recurrent symptoms.  HYPERLIPIDEMIA On statin therapy followed by her PCP  HYPERTENSION Borderline controlled but she says she took her medicines like today      Runell Gess MD [facility], [facility] 06/26/2013 2:44 PM

## 2013-06-26 NOTE — Assessment & Plan Note (Signed)
Borderline controlled but she says she took her medicines like today

## 2013-06-26 NOTE — Assessment & Plan Note (Addendum)
Patient had her distal distal posterior lateral branch stented by Dr. Elsie Lincoln 05/26/08 with residual disease in the PDA branch and normal LV function. Myoview performed 05/06/08 was nonischemic. She had repeat catheterization because of chest pain 957/12 which revealed a patent posterior lateral branch stent, 50-60% mid nondominant RCA with normal LV function. She saw Nada Boozer in the office and 05/13/13  with chest pain and had a Myoview stress test that was normal. She's had no recurrent symptoms.

## 2013-06-26 NOTE — Assessment & Plan Note (Signed)
On statin therapy followed by her PCP 

## 2013-07-02 ENCOUNTER — Telehealth: Payer: Self-pay | Admitting: Internal Medicine

## 2013-07-02 NOTE — Telephone Encounter (Signed)
Patient is scheduled to come in tomorrow for an A1C test and wanted to see could she have her chol checked too.

## 2013-07-03 ENCOUNTER — Other Ambulatory Visit (INDEPENDENT_AMBULATORY_CARE_PROVIDER_SITE_OTHER): Payer: BC Managed Care – PPO

## 2013-07-03 ENCOUNTER — Ambulatory Visit (INDEPENDENT_AMBULATORY_CARE_PROVIDER_SITE_OTHER): Payer: BC Managed Care – PPO | Admitting: Family Medicine

## 2013-07-03 ENCOUNTER — Encounter: Payer: Self-pay | Admitting: Family Medicine

## 2013-07-03 VITALS — BP 142/76 | HR 71 | Resp 18 | Wt 202.8 lb

## 2013-07-03 DIAGNOSIS — N390 Urinary tract infection, site not specified: Secondary | ICD-10-CM

## 2013-07-03 DIAGNOSIS — E785 Hyperlipidemia, unspecified: Secondary | ICD-10-CM

## 2013-07-03 DIAGNOSIS — E119 Type 2 diabetes mellitus without complications: Secondary | ICD-10-CM

## 2013-07-03 LAB — POCT URINALYSIS DIPSTICK
Bilirubin, UA: NEGATIVE
Glucose, UA: NEGATIVE
Ketones, UA: NEGATIVE
Nitrite, UA: POSITIVE
Protein, UA: NEGATIVE
Spec Grav, UA: >=1.03
Urobilinogen, UA: 0.2
pH, UA: 6

## 2013-07-03 LAB — LIPID PANEL
Cholesterol: 143 mg/dL (ref 0–200)
HDL: 45.5 mg/dL (ref >39.00)
LDL Cholesterol: 76 mg/dL (ref 0–99)
Total CHOL/HDL Ratio: 3
Triglycerides: 109 mg/dL (ref 0.0–149.0)
VLDL: 21.8 mg/dL (ref 0.0–40.0)

## 2013-07-03 LAB — HEMOGLOBIN A1C: Hgb A1c MFr Bld: 7.4 % — ABNORMAL HIGH (ref 4.6–6.5)

## 2013-07-03 MED ORDER — PHENAZOPYRIDINE HCL 200 MG PO TABS: 200.0000 mg | ORAL_TABLET | Freq: Three times a day (TID) | ORAL | Status: DC | PRN

## 2013-07-03 MED ORDER — NITROFURANTOIN MONOHYD MACRO 100 MG PO CAPS: 100.0000 mg | ORAL_CAPSULE | Freq: Two times a day (BID) | ORAL | Status: DC

## 2013-07-03 NOTE — Progress Notes (Signed)
  Subjective:    [name] is a 62 y.o. female who complains of dysuria, frequency, suprapubic pressure and urgency. She has had symptoms for 3 weeks. Patient also complains of stomach ache. Patient denies back pain, congestion, cough, fever, headache, rhinitis, sorethroat, stomach ache and vaginal discharge. Patient does not have a history of recurrent UTI. Patient does not have a history of pyelonephritis.   The following portions of the patient's history were reviewed and updated as appropriate: allergies, current medications, past family history, past medical history, past social history, past surgical history and problem list.  Review of Systems Pertinent items are noted in HPI.    Objective:    BP 142/76  Pulse 71  Resp 18  Wt 202 lb 12.8 oz (91.989 kg)  SpO2 96% General appearance: alert, cooperative, appears stated age and no distress Neurologic: Alert and oriented X 3, normal strength and tone. Normal symmetric reflexes. Normal coordination and gait  Laboratory:  Urine dipstick: 1+ for hemoglobin, 3+ for leukocyte esterase and positive for nitrites.   Micro exam: not done.    Assessment:    Acute cystitis and UTI     Plan:    Medications: nitrofurantoin. Maintain adequate hydration. Follow up if symptoms not improving, and as needed.

## 2013-07-03 NOTE — Progress Notes (Signed)
Pre visit review using our clinic review tool, if applicable. No additional management support is needed unless otherwise documented below in the visit note. 

## 2013-07-03 NOTE — Patient Instructions (Signed)
Urinary Tract Infection  Urinary tract infections (UTIs) can develop anywhere along your urinary tract. Your urinary tract is your body's drainage system for removing wastes and extra water. Your urinary tract includes two kidneys, two ureters, a bladder, and a urethra. Your kidneys are a pair of bean-shaped organs. Each kidney is about the size of your fist. They are located below your ribs, one on each side of your spine.  CAUSES  Infections are caused by microbes, which are microscopic organisms, including fungi, viruses, and bacteria. These organisms are so small that they can only be seen through a microscope. Bacteria are the microbes that most commonly cause UTIs.  SYMPTOMS   Symptoms of UTIs may vary by age and gender of the patient and by the location of the infection. Symptoms in young women typically include a frequent and intense urge to urinate and a painful, burning feeling in the bladder or urethra during urination. Older women and men are more likely to be tired, shaky, and weak and have muscle aches and abdominal pain. A fever may mean the infection is in your kidneys. Other symptoms of a kidney infection include pain in your back or sides below the ribs, nausea, and vomiting.  DIAGNOSIS  To diagnose a UTI, your caregiver will ask you about your symptoms. Your caregiver also will ask to provide a urine sample. The urine sample will be tested for bacteria and white blood cells. White blood cells are made by your body to help fight infection.  TREATMENT   Typically, UTIs can be treated with medication. Because most UTIs are caused by a bacterial infection, they usually can be treated with the use of antibiotics. The choice of antibiotic and length of treatment depend on your symptoms and the type of bacteria causing your infection.  HOME CARE INSTRUCTIONS   If you were prescribed antibiotics, take them exactly as your caregiver instructs you. Finish the medication even if you feel better after you  have only taken some of the medication.   Drink enough water and fluids to keep your urine clear or pale yellow.   Avoid caffeine, tea, and carbonated beverages. They tend to irritate your bladder.   Empty your bladder often. Avoid holding urine for long periods of time.   Empty your bladder before and after sexual intercourse.   After a bowel movement, women should cleanse from front to back. Use each tissue only once.  SEEK MEDICAL CARE IF:    You have back pain.   You develop a fever.   Your symptoms do not begin to resolve within 3 days.  SEEK IMMEDIATE MEDICAL CARE IF:    You have severe back pain or lower abdominal pain.   You develop chills.   You have nausea or vomiting.   You have continued burning or discomfort with urination.  MAKE SURE YOU:    Understand these instructions.   Will watch your condition.   Will get help right away if you are not doing well or get worse.  Document Released: 05/09/2005 Document Revised: 01/29/2012 Document Reviewed: 09/07/2011  [employer] Patient Information 2014 [employer]

## 2013-07-06 LAB — URINE CULTURE: Colony Count: >=100000

## 2013-07-18 ENCOUNTER — Other Ambulatory Visit: Payer: Self-pay | Admitting: Internal Medicine

## 2013-07-20 NOTE — Telephone Encounter (Signed)
Quinapril refilled per protocol

## 2013-07-31 ENCOUNTER — Encounter: Payer: Self-pay | Admitting: Internal Medicine

## 2013-07-31 ENCOUNTER — Other Ambulatory Visit: Payer: Self-pay | Admitting: *Deleted

## 2013-07-31 MED ORDER — GLIMEPIRIDE 4 MG PO TABS: 4.0000 mg | ORAL_TABLET | Freq: Every day | ORAL | Status: DC

## 2013-07-31 NOTE — Telephone Encounter (Signed)
Please advise 

## 2013-08-04 ENCOUNTER — Ambulatory Visit: Payer: BC Managed Care – PPO | Admitting: Internal Medicine

## 2013-08-19 ENCOUNTER — Other Ambulatory Visit: Payer: Self-pay | Admitting: *Deleted

## 2013-08-19 MED ORDER — ROSUVASTATIN CALCIUM 10 MG PO TABS: 10.0000 mg | ORAL_TABLET | Freq: Every day | ORAL | Status: DC

## 2013-08-21 ENCOUNTER — Ambulatory Visit (INDEPENDENT_AMBULATORY_CARE_PROVIDER_SITE_OTHER): Payer: BC Managed Care – PPO | Admitting: Emergency Medicine

## 2013-08-21 VITALS — BP 124/52 | HR 70 | Temp 97.2°F | Resp 16 | Ht 62.0 in | Wt 206.0 lb

## 2013-08-21 DIAGNOSIS — R05 Cough: Secondary | ICD-10-CM

## 2013-08-21 DIAGNOSIS — R059 Cough, unspecified: Secondary | ICD-10-CM

## 2013-08-21 DIAGNOSIS — J45901 Unspecified asthma with (acute) exacerbation: Secondary | ICD-10-CM

## 2013-08-21 LAB — POCT INFLUENZA A/B
Influenza A, POC: NEGATIVE
Influenza B, POC: NEGATIVE

## 2013-08-21 MED ORDER — LEVALBUTEROL HCL 0.63 MG/3ML IN NEBU: 0.6300 mg | INHALATION_SOLUTION | Freq: Four times a day (QID) | RESPIRATORY_TRACT | Status: DC | PRN

## 2013-08-21 MED ORDER — BUDESONIDE-FORMOTEROL FUMARATE 160-4.5 MCG/ACT IN AERO: 2.0000 | INHALATION_SPRAY | Freq: Two times a day (BID) | RESPIRATORY_TRACT | Status: DC

## 2013-08-21 NOTE — Progress Notes (Signed)
   Subjective:    Patient ID: [name], female    DOB: [birth year], 63 y.o.   MRN: [id number]  HPI    Review of Systems 63 year old woman complaining of HA, sinus pain, and asthma like symptoms. History of asthma, treats with xopenex twice daily, denies using albuterol inhaler. Sinus pain, worsened with cough. Pt has a nebulizer but denies using it because she didn't want to suppress symptoms before visit. Pt has symbicort but has not used it, she was confused about when to use the inhaler. Pt denies sore throat.      Objective:   Physical Exam H. EENT exam is unremarkable. Her neck is supple chest is clear to auscultation and percussion without audible wheezing. Her heart is a regular rate without murmurs rubs or gallops. Abdomen is soft nontender.  Results for orders placed in visit on 08/21/13  POCT INFLUENZA A/B      Result Value Range   Influenza A, POC Negative     Influenza B, POC Negative          Assessment & Plan:  Patient to resume her symbicort.. I did call in Xopenex 0.63  To be used her nebulizer  as needed. She will continue her Xopenex she has as a  rescue inhaler.

## 2013-08-21 NOTE — Patient Instructions (Addendum)
Symbicort 2 puffs twice a day followed by rinsing her mouth out with water. She has both Xopenex handheld as well as Xopenex nebulizer solution.

## 2013-09-09 ENCOUNTER — Other Ambulatory Visit: Payer: Self-pay

## 2013-09-09 DIAGNOSIS — Z1231 Encounter for screening mammogram for malignant neoplasm of breast: Secondary | ICD-10-CM

## 2013-09-11 ENCOUNTER — Ambulatory Visit (INDEPENDENT_AMBULATORY_CARE_PROVIDER_SITE_OTHER): Payer: BC Managed Care – PPO | Admitting: Internal Medicine

## 2013-09-11 ENCOUNTER — Encounter: Payer: Self-pay | Admitting: Internal Medicine

## 2013-09-11 VITALS — BP 153/70 | HR 70 | Temp 98.3°F | Wt 203.2 lb

## 2013-09-11 DIAGNOSIS — E160 Drug-induced hypoglycemia without coma: Secondary | ICD-10-CM

## 2013-09-11 DIAGNOSIS — E1165 Type 2 diabetes mellitus with hyperglycemia: Principal | ICD-10-CM

## 2013-09-11 DIAGNOSIS — I10 Essential (primary) hypertension: Secondary | ICD-10-CM

## 2013-09-11 DIAGNOSIS — E161 Other hypoglycemia: Secondary | ICD-10-CM

## 2013-09-11 DIAGNOSIS — IMO0001 Reserved for inherently not codable concepts without codable children: Secondary | ICD-10-CM

## 2013-09-11 DIAGNOSIS — T50904A Poisoning by unspecified drugs, medicaments and biological substances, undetermined, initial encounter: Secondary | ICD-10-CM

## 2013-09-11 MED ORDER — GLIMEPIRIDE 2 MG PO TABS: 2.0000 mg | ORAL_TABLET | Freq: Every day | ORAL | Status: DC

## 2013-09-11 MED ORDER — SITAGLIPTIN PHOS-METFORMIN HCL 50-1000 MG PO TABS: ORAL_TABLET | ORAL | Status: DC

## 2013-09-11 NOTE — Progress Notes (Signed)
   Subjective:    Patient ID: [name], female    DOB: Dec 22, 1950, 63 y.o.   MRN: [id number]  HPI HPI Diabetes status assessment:  Fasting or morning glucose range average is not monitored. Highest glucose 2 hours after any meal is not monitored.  Hypoglycemia reported 07/31/14, 08/03/14, 12/25 after glimepiride (4mg ). Three episodes shaky, sweating, disoriented, no LOC - did not take CBG during those events.  Reports after 12/25 event she has since cut glimepiride in half and has had no other hypoglycemic events since that dosage change.  No regular exercise described as walking 2-3 blocks 7 days per week for 15 minutes.  No specific nutrition/diet followed  Medication compliance is good.  See above for medication adverse effects. Eye exam current, September 2014. No visual field changes in glaucoma.  Foot care not current. Recent A1C 07/03/13 was 7.4 Urine microalbumin monitor 08/13/12 (average sugar with long-term % risk )   Review of Systems   No excess thirst, excess hunger, or excess urination reported. No lightheadedness with standing reported.  No chest pain, palpitations, claudication described. No non healing skin ulcers or sores of extremities noted.  Cold intolerance noted bilaterally in feet; reports occasionally dropping items from hands; denies numbness, tingling, burning in all extremities.  R foot - feels new pair of socks rubbed area with orthotic shoes. No blister, skin intact.  No significant change in weight of pound gain pound loss.  No blurred,double, or loss of vision reported.  Objective:   Physical Exam Gen.: Healthy and well-nourished in appearance. Alert, appropriate and cooperative throughout exam.  Appears younger than stated age  Head: Normocephalic without obvious abnormalities; normal hair distribution.  Eyes: No corneal or conjunctival inflammation noted. Pupils equal round reactive to light and accommodation. Extraocular motion intact. Fundal  exam is benign without hemorrhages, exudate, papilledema. Vision grossly normal with lenses  Ears: External ear exam reveals no significant lesions or deformities. Canals clear .TMs normal. Hearing is grossly normal bilaterally.  Nose: External nasal exam reveals no deformity or inflammation. Nasal mucosa are pink and moist. No lesions or exudates noted. Septum midline.  Mouth: Oral mucosa and oropharynx reveal no lesions or exudates. Teeth in good repair.  Neck: No deformities, masses, or tenderness noted. Range of motion WNL. Thyroid palpable, small goiter on L node.  Lungs: Normal respiratory effort; chest expands symmetrically. Lungs are clear to auscultation without rales, wheezes, no increased work of breathing.  Heart: Normal rate and rhythm. Normal S1 and S2. No gallop, click, or rub. S4 murmur.  Abdomen: Bowel sounds normal; abdomen soft and nontender. No masses, organomegaly or hernias noted.  Musculoskeletal/extremities: No deformity or scoliosis noted of the thoracic or lumbar spine.  No clubbing, cyanosis, edema, or significant extremity deformity noted. Range of motion normal .Tone & strength normal.  Hand joints normal , mild DIP changes. Fingernail health good. Missing great toe nails bilaterally d/t ingrown nails.  Able to lie down & sit up w/o help.  Vascular: soft R carotid bruit; radial artery, dorsalis pedis and posterior tibial pulses are full and equal.  Neurologic: Alert and oriented x3. Deep tendon reflexes symmetrical and normal.  Gait normal. Skin: Intact without suspicious lesions or rashes. R foot, small  Lymph: No cervical or axillary lymphadenopathy present.  Psych: Mood and affect are appropriate to situation.      Assessment & Plan:

## 2013-09-11 NOTE — Progress Notes (Signed)
Pre visit review using our clinic review tool, if applicable. No additional management support is needed unless otherwise documented below in the visit note. 

## 2013-09-11 NOTE — Progress Notes (Signed)
   Subjective:    Patient ID: [name], female    DOB: 08-14-1950, 63 y.o.   MRN: [id number]  HPI  Diabetes status assessment: Fasting or morning glucose range average is not monitored.  Highest glucose 2 hours after any meal is not monitored.  Hypoglycemia reported 07/31/14, 08/03/14, 12/25 before evening meal, after glimepiride (4mg ). Three episodes shaky, sweating, disoriented, no LOC - did not take CBG during those events. Had not eaten lunch.  Reports after 12/25 event she has since cut glimepiride in half and has had no other hypoglycemic events since that dosage change.  Exercise described as walking 2-3 blocks 7 days per week for 15 minutes.  No specific nutrition/diet followed  Medication compliance is good.  See above for medication adverse effects.  Eye exam current, September 2014. No visual field changes in glaucoma.  Foot care not current.  Recent A1C 07/03/13 was 7.4     Review of Systems  No excess thirst, excess hunger, or excess urination reported.  No lightheadedness with standing reported.  No chest pain, palpitations, claudication described.  No non healing skin ulcers or sores of extremities noted.  Cold intolerance noted bilaterally in feet; reports occasionally dropping items from hands; denies numbness, tingling, burning in all extremities.  R foot  feels new pair of socks rubbed area with orthotic shoes.  No significant change in weight   No blurred,double, or loss of vision reported.         Objective:   Physical Exam  Gen.:well-nourished in appearance. Central weight excess.Alert, appropriate and cooperative throughout exam.  Appears younger than stated age  Head: Normocephalic without obvious abnormalities; normal hair distribution.  Eyes: No corneal or conjunctival inflammation noted. Pupils equal round reactive to light and accommodation. Extraocular motion intact. Fundal exam is benign without hemorrhages, exudate, papilledema.   Ears:  External ear exam reveals no significant lesions or deformities. Canals clear .TMs normal. Hearing is grossly normal bilaterally.  Nose: External nasal exam reveals no deformity or inflammation. Nasal mucosa are pink and moist. No lesions or exudates noted. Septum midline.  Mouth: Oral mucosa and oropharynx reveal no lesions or exudates. Teeth in good repair.  Neck: No deformities, masses, or tenderness noted. Range of motion WNL. Thyroid palpable, small goiter on L .  Lungs: Normal respiratory effort; chest expands symmetrically. Lungs are clear to auscultation without rales, wheezes, no increased work of breathing.  Heart: Normal rate and rhythm. Normal S1 and S2. No gallop, click, or rub. S4 w/o murmur.  Abdomen: Bowel sounds normal; abdomen soft and nontender. No masses, organomegaly or hernias noted.  Musculoskeletal/extremities: No deformity or scoliosis noted of the thoracic or lumbar spine.  No clubbing, cyanosis, edema, or significant extremity deformity noted. Range of motion normal .Tone & strength normal.  Hand joints normal , mild DIP changes. Fingernail health good. Missing great toe nails bilaterally .  Able to lie down & sit up w/o help.  Vascular: soft R carotid bruit grade  1/2; radial artery, dorsalis pedis and posterior tibial pulses are full and equal.  Neurologic: Alert and oriented x3. Deep tendon reflexes symmetrical and normal.  Gait normal.  Light touch normal over feet. Skin: Intact without suspicious lesions; R lateral  Lymph: No cervical or axillary lymphadenopathy present.  Psych: Mood and affect are appropriate to situation.          Assessment & Plan:  See Current Assessment & Plan in Problem List under specific Diagnosis

## 2013-09-11 NOTE — Assessment & Plan Note (Addendum)
Labs in late Feb Add protein @ "lunch" to prevent hypoglycemia Short term (hypoglycemia & weight gain) and long term (cardiac)potential risks of sulfonylurea discussed; she wants to continue for now @ lower dose.

## 2013-09-11 NOTE — Patient Instructions (Addendum)
Your next office appointment will be March 3 @ [facility] for labs Minimal Blood Pressure Goal= AVERAGE < 140/90;  Ideal is an AVERAGE < 135/85. This AVERAGE should be calculated from @ least 5-7 BP readings taken @ different times of day on different days of week. You should not respond to isolated BP readings , but rather the AVERAGE for that week .Please bring your  blood pressure cuff to office visits to verify that it is reliable.It  can also be checked against the blood pressure device at the pharmacy. Finger or wrist cuffs are not dependable; an arm cuff is.

## 2013-09-12 NOTE — Assessment & Plan Note (Signed)
BP goals discussed 

## 2013-09-14 ENCOUNTER — Other Ambulatory Visit: Payer: Self-pay

## 2013-09-14 MED ORDER — ATENOLOL 50 MG PO TABS: ORAL_TABLET | ORAL | Status: DC

## 2013-09-14 MED ORDER — PRASUGREL HCL 10 MG PO TABS: 10.0000 mg | ORAL_TABLET | Freq: Every day | ORAL | Status: DC

## 2013-09-14 NOTE — Telephone Encounter (Signed)
Rx was sent to pharmacy electronically. 

## 2013-09-15 ENCOUNTER — Telehealth: Payer: Self-pay

## 2013-09-15 ENCOUNTER — Other Ambulatory Visit: Payer: Self-pay | Admitting: *Deleted

## 2013-09-15 MED ORDER — AMLODIPINE BESYLATE 10 MG PO TABS
ORAL_TABLET | ORAL | Status: DC
Start: 2013-09-15 — End: 2013-12-16

## 2013-09-15 NOTE — Telephone Encounter (Signed)
Relevant patient education assigned to patient using Emmi. ° °

## 2013-09-25 ENCOUNTER — Ambulatory Visit: Payer: BC Managed Care – PPO

## 2013-10-30 ENCOUNTER — Ambulatory Visit
Admission: RE | Admit: 2013-10-30 | Discharge: 2013-10-30 | Disposition: A | Discharge status: Home / Self Care | Payer: Medicare Other | Source: Ambulatory Visit

## 2013-10-30 DIAGNOSIS — Z1231 Encounter for screening mammogram for malignant neoplasm of breast: Secondary | ICD-10-CM

## 2013-11-14 ENCOUNTER — Other Ambulatory Visit: Payer: Self-pay | Admitting: Family Medicine

## 2013-11-19 ENCOUNTER — Other Ambulatory Visit: Payer: Self-pay

## 2013-11-23 ENCOUNTER — Encounter: Payer: Self-pay | Admitting: Internal Medicine

## 2013-11-23 ENCOUNTER — Other Ambulatory Visit (INDEPENDENT_AMBULATORY_CARE_PROVIDER_SITE_OTHER): Payer: Medicare Other

## 2013-11-23 ENCOUNTER — Ambulatory Visit (INDEPENDENT_AMBULATORY_CARE_PROVIDER_SITE_OTHER): Payer: Medicare Other | Admitting: Internal Medicine

## 2013-11-23 VITALS — BP 152/60 | HR 73 | Temp 97.3°F | Resp 13 | Wt 206.0 lb

## 2013-11-23 DIAGNOSIS — I2510 Atherosclerotic heart disease of native coronary artery without angina pectoris: Secondary | ICD-10-CM

## 2013-11-23 DIAGNOSIS — E1159 Type 2 diabetes mellitus with other circulatory complications: Secondary | ICD-10-CM

## 2013-11-23 LAB — MICROALBUMIN / CREATININE URINE RATIO
Creatinine,U: 30.8 mg/dL
Microalb Creat Ratio: 120.9 mg/g — ABNORMAL HIGH (ref 0.0–30.0)
Microalb, Ur: 37.2 mg/dL — ABNORMAL HIGH (ref 0.0–1.9)

## 2013-11-23 LAB — HEMOGLOBIN A1C: Hgb A1c MFr Bld: 6.7 % — ABNORMAL HIGH (ref 4.6–6.5)

## 2013-11-23 MED ORDER — CANAGLIFLOZIN 100 MG PO TABS: 100.0000 mg | ORAL_TABLET | Freq: Every day | ORAL | Status: DC

## 2013-11-23 MED ORDER — CLOTRIMAZOLE-BETAMETHASONE 1-0.05 % EX CREA: 1.0000 "application " | TOPICAL_CREAM | Freq: Two times a day (BID) | CUTANEOUS | Status: DC

## 2013-11-23 NOTE — Assessment & Plan Note (Addendum)
D/C Glimiperide due to potential CV risks [place] A1c Podiatry F/U with Dr Janus Molder to assess foot lesion

## 2013-11-23 NOTE — Progress Notes (Signed)
   Subjective:    Patient ID: [name], female    DOB: 09/11/1950, 63 y.o.   MRN: [id number]  HPI Here to discuss diabetes med change.    Review of Systems   Diabetes :  FBS range/average: Run 140-154 (results from 3 weeks, ago, lost glucometer)  Highest 2 hr post meal glucose: 160-185, occasionally over 200  Medication compliance: taking janumet and glimepiride  Hypoglycemia: none  Ophthamology care: eye exam scheduled in 2 weeks. No diabetic changes at exam 6 months ago.  Podiatry care: checks feet daily, wears shoes around the house . Concerned about tender growth of lateral foot.    Objective:   Physical Exam Constitutional: No distress.  Obese.  HENT:  Head: Normocephalic and atraumatic.  Mouth/Throat: Oropharynx is clear and moist. No oropharyngeal exudate.  Eyes: Pupils are equal, round, and reactive to light.  Neck: Neck supple.  Cardiovascular: Normal rate, regular rhythm and intact distal pulses. Grade 4.9-4 /6 systolic murmur with neck radiation Pulmonary/Chest: Effort normal and breath sounds normal. No respiratory distress. She has no wheezes. She has no rales. She exhibits no tenderness.  Abdominal: Soft. Bowel sounds are normal. She exhibits no distension and no mass. There is no tenderness.  Lymphadenopathy:  She has no cervical adenopathy (No axillary adenopathy.).  Neurological: She is alert. She displays normal reflexes.  Light touch normal over feet. Skin: Skin is warm and dry. She is not diaphoretic. Small , tender firm lesion lateral aspect R foot           Assessment & Plan:  See Current Assessment & Plan in Problem List under specific Diagnosis

## 2013-11-23 NOTE — Progress Notes (Signed)
Pre visit review using our clinic review tool, if applicable. No additional management support is needed unless otherwise documented below in the visit note. 

## 2013-11-23 NOTE — Progress Notes (Signed)
   Subjective:    Patient ID: [name], female    DOB: Oct 08, 1950, 63 y.o.   MRN: [id number]  HPI Here to discuss diabetic meds.    Review of Systems  FASTING HYPERGLYCEMIA OR Diabetes :  FBS range/average: Run 140-154 (results from 3 weeks, ago, lost glucometer) Highest 2 hr post meal glucose: 160-185, occasionally over 200 Medication compliance: taking janumet and glimepiride Hypoglycemia: none Ophthamology care: eye exam scheduled in 2 weeks. No diabetic changes at exam 6 months ago. Podiatry care: checks feet daily, wears shoes around the house       Objective:   Physical Exam  Constitutional: No distress.  Obese.  HENT:  Head: Normocephalic and atraumatic.  Mouth/Throat: Oropharynx is clear and moist. No oropharyngeal exudate.  Eyes: Pupils are equal, round, and reactive to light.  Neck: Neck supple.  Cardiovascular: Normal rate, regular rhythm and intact distal pulses.   Pulmonary/Chest: Effort normal and breath sounds normal. No respiratory distress. She has no wheezes. She has no rales. She exhibits no tenderness.  Abdominal: Soft. Bowel sounds are normal. She exhibits no distension and no mass. There is no tenderness.  Lymphadenopathy:    She has no cervical adenopathy (No axillary adenopathy.).  Neurological: She is alert. She displays normal reflexes.  Skin: Skin is warm and dry. She is not diaphoretic.          Assessment & Plan:

## 2013-11-23 NOTE — Patient Instructions (Signed)
Minimal Blood Pressure Goal= AVERAGE < 140/90;  Ideal is an AVERAGE < 135/85. This AVERAGE should be calculated from @ least 5-7 BP readings taken @ different times of day on different days of week. You should not respond to isolated BP readings , but rather the AVERAGE for that week .Please bring your  blood pressure cuff to office visits to verify that it is reliable.It  can also be checked against the blood pressure device at the pharmacy. Finger or wrist cuffs are not dependable; an arm cuff is.  Your next office appointment will be determined based upon review of your pending labs. Those instructions will be transmitted to you through My Chart  . 

## 2013-11-23 NOTE — Assessment & Plan Note (Addendum)
D/C Glimiperide F/U carotid bruits vs murmur radiation with Dr Gwenlyn Found

## 2013-12-16 ENCOUNTER — Ambulatory Visit (INDEPENDENT_AMBULATORY_CARE_PROVIDER_SITE_OTHER): Payer: Medicare Other | Admitting: Internal Medicine

## 2013-12-16 ENCOUNTER — Encounter (HOSPITAL_COMMUNITY): Payer: Self-pay | Admitting: Emergency Medicine

## 2013-12-16 ENCOUNTER — Inpatient Hospital Stay (HOSPITAL_COMMUNITY)
Admission: EM | Admit: 2013-12-16 | Discharge: 2013-12-18 | DRG: 310 | Disposition: A | Discharge status: Home / Self Care | Payer: Medicare Other | Attending: Internal Medicine | Admitting: Internal Medicine

## 2013-12-16 ENCOUNTER — Encounter: Payer: Self-pay | Admitting: Internal Medicine

## 2013-12-16 ENCOUNTER — Emergency Department (HOSPITAL_COMMUNITY): Payer: Medicare Other

## 2013-12-16 VITALS — BP 140/90 | HR 96 | Resp 14 | Wt 204.0 lb

## 2013-12-16 DIAGNOSIS — Z888 Allergy status to other drugs, medicaments and biological substances status: Secondary | ICD-10-CM

## 2013-12-16 DIAGNOSIS — Z8249 Family history of ischemic heart disease and other diseases of the circulatory system: Secondary | ICD-10-CM

## 2013-12-16 DIAGNOSIS — I4892 Unspecified atrial flutter: Secondary | ICD-10-CM

## 2013-12-16 DIAGNOSIS — IMO0001 Reserved for inherently not codable concepts without codable children: Secondary | ICD-10-CM

## 2013-12-16 DIAGNOSIS — E1165 Type 2 diabetes mellitus with hyperglycemia: Secondary | ICD-10-CM

## 2013-12-16 DIAGNOSIS — I798 Other disorders of arteries, arterioles and capillaries in diseases classified elsewhere: Secondary | ICD-10-CM | POA: Diagnosis present

## 2013-12-16 DIAGNOSIS — R0609 Other forms of dyspnea: Secondary | ICD-10-CM

## 2013-12-16 DIAGNOSIS — E1159 Type 2 diabetes mellitus with other circulatory complications: Secondary | ICD-10-CM

## 2013-12-16 DIAGNOSIS — M545 Low back pain, unspecified: Secondary | ICD-10-CM | POA: Diagnosis present

## 2013-12-16 DIAGNOSIS — E785 Hyperlipidemia, unspecified: Secondary | ICD-10-CM | POA: Diagnosis present

## 2013-12-16 DIAGNOSIS — Z7982 Long term (current) use of aspirin: Secondary | ICD-10-CM

## 2013-12-16 DIAGNOSIS — R0989 Other specified symptoms and signs involving the circulatory and respiratory systems: Secondary | ICD-10-CM

## 2013-12-16 DIAGNOSIS — K219 Gastro-esophageal reflux disease without esophagitis: Secondary | ICD-10-CM | POA: Diagnosis present

## 2013-12-16 DIAGNOSIS — R0600 Dyspnea, unspecified: Secondary | ICD-10-CM

## 2013-12-16 DIAGNOSIS — Z87891 Personal history of nicotine dependence: Secondary | ICD-10-CM

## 2013-12-16 DIAGNOSIS — R079 Chest pain, unspecified: Secondary | ICD-10-CM

## 2013-12-16 DIAGNOSIS — I4891 Unspecified atrial fibrillation: Principal | ICD-10-CM

## 2013-12-16 DIAGNOSIS — E1169 Type 2 diabetes mellitus with other specified complication: Secondary | ICD-10-CM | POA: Diagnosis present

## 2013-12-16 DIAGNOSIS — J4489 Other specified chronic obstructive pulmonary disease: Secondary | ICD-10-CM | POA: Diagnosis present

## 2013-12-16 DIAGNOSIS — Z91041 Radiographic dye allergy status: Secondary | ICD-10-CM

## 2013-12-16 DIAGNOSIS — I10 Essential (primary) hypertension: Secondary | ICD-10-CM | POA: Diagnosis present

## 2013-12-16 DIAGNOSIS — G8929 Other chronic pain: Secondary | ICD-10-CM | POA: Diagnosis present

## 2013-12-16 DIAGNOSIS — J449 Chronic obstructive pulmonary disease, unspecified: Secondary | ICD-10-CM | POA: Diagnosis present

## 2013-12-16 DIAGNOSIS — Z9861 Coronary angioplasty status: Secondary | ICD-10-CM

## 2013-12-16 DIAGNOSIS — I2510 Atherosclerotic heart disease of native coronary artery without angina pectoris: Secondary | ICD-10-CM

## 2013-12-16 DIAGNOSIS — Z800 Family history of malignant neoplasm of digestive organs: Secondary | ICD-10-CM

## 2013-12-16 HISTORY — DX: Unspecified atrial fibrillation: I48.91

## 2013-12-16 LAB — BASIC METABOLIC PANEL
BUN: 20 mg/dL (ref 6–23)
CO2: 22 mEq/L (ref 19–32)
Calcium: 9.2 mg/dL (ref 8.4–10.5)
Chloride: 99 mEq/L (ref 96–112)
Creatinine, Ser: 0.7 mg/dL (ref 0.50–1.10)
GFR calc Af Amer: >90 mL/min (ref >90)
GFR calc non Af Amer: 89 mL/min — ABNORMAL LOW (ref >90)
Glucose, Bld: 191 mg/dL — ABNORMAL HIGH (ref 70–99)
Potassium: 4.8 mEq/L (ref 3.7–5.3)
Sodium: 132 mEq/L — ABNORMAL LOW (ref 137–147)

## 2013-12-16 LAB — CBC
HCT: 35.5 % — ABNORMAL LOW (ref 36.0–46.0)
Hemoglobin: 12.4 g/dL (ref 12.0–15.0)
MCH: 30 pg (ref 26.0–34.0)
MCHC: 34.9 g/dL (ref 30.0–36.0)
MCV: 85.7 fL (ref 78.0–100.0)
Platelets: 275 10*3/uL (ref 150–400)
RBC: 4.14 MIL/uL (ref 3.87–5.11)
RDW: 13.3 % (ref 11.5–15.5)
WBC: 7.6 10*3/uL (ref 4.0–10.5)

## 2013-12-16 LAB — I-STAT TROPONIN, ED: Troponin i, poc: 0.01 ng/mL (ref 0.00–0.08)

## 2013-12-16 LAB — PRO B NATRIURETIC PEPTIDE: Pro B Natriuretic peptide (BNP): 960.1 pg/mL — ABNORMAL HIGH (ref 0–125)

## 2013-12-16 LAB — MAGNESIUM: Magnesium: 1.7 mg/dL (ref 1.5–2.5)

## 2013-12-16 LAB — CBG MONITORING, ED
Glucose-Capillary: 153 mg/dL — ABNORMAL HIGH (ref 70–99)
Glucose-Capillary: 154 mg/dL — ABNORMAL HIGH (ref 70–99)

## 2013-12-16 LAB — TSH: TSH: 2.37 u[IU]/mL (ref 0.350–4.500)

## 2013-12-16 MED ORDER — SODIUM CHLORIDE 0.9 % IV SOLN
INTRAVENOUS | Status: DC
Start: 2013-12-16 — End: 2013-12-17
  Administered 2013-12-16: 22:00:00 via INTRAVENOUS

## 2013-12-16 MED ORDER — PANTOPRAZOLE SODIUM 40 MG PO TBEC
40.0000 mg | DELAYED_RELEASE_TABLET | Freq: Every day | ORAL | Status: DC
  Administered 2013-12-17: 40 mg via ORAL
  Filled 2013-12-16 (×2): qty 1

## 2013-12-16 MED ORDER — DILTIAZEM HCL 25 MG/5ML IV SOLN
20.0000 mg | Freq: Once | INTRAVENOUS | Status: AC
  Administered 2013-12-16: 20 mg via INTRAVENOUS
  Filled 2013-12-16: qty 5

## 2013-12-16 MED ORDER — PAROXETINE HCL 20 MG PO TABS
25.0000 mg | ORAL_TABLET | Freq: Every day | ORAL | Status: DC
  Administered 2013-12-17: 25 mg via ORAL
  Filled 2013-12-16 (×2): qty 0.5

## 2013-12-16 MED ORDER — SODIUM CHLORIDE 0.9 % IJ SOLN
3.0000 mL | Freq: Two times a day (BID) | INTRAMUSCULAR | Status: DC
  Administered 2013-12-16 – 2013-12-17 (×2): 3 mL via INTRAVENOUS

## 2013-12-16 MED ORDER — ASPIRIN EC 81 MG PO TBEC
81.0000 mg | DELAYED_RELEASE_TABLET | Freq: Every day | ORAL | Status: DC
  Administered 2013-12-16 – 2013-12-17 (×2): 81 mg via ORAL
  Filled 2013-12-16 (×3): qty 1

## 2013-12-16 MED ORDER — ATENOLOL 50 MG PO TABS
50.0000 mg | ORAL_TABLET | Freq: Every day | ORAL | Status: DC
  Administered 2013-12-17: 50 mg via ORAL
  Filled 2013-12-16 (×2): qty 1

## 2013-12-16 MED ORDER — SITAGLIPTIN PHOS-METFORMIN HCL 50-1000 MG PO TABS: 0.5000 | ORAL_TABLET | Freq: Two times a day (BID) | ORAL | Status: DC

## 2013-12-16 MED ORDER — BUDESONIDE-FORMOTEROL FUMARATE 160-4.5 MCG/ACT IN AERO
2.0000 | INHALATION_SPRAY | Freq: Two times a day (BID) | RESPIRATORY_TRACT | Status: DC
  Filled 2013-12-16: qty 6

## 2013-12-16 MED ORDER — LEVALBUTEROL HCL 0.63 MG/3ML IN NEBU: 0.6300 mg | INHALATION_SOLUTION | Freq: Four times a day (QID) | RESPIRATORY_TRACT | Status: DC | PRN

## 2013-12-16 MED ORDER — SODIUM CHLORIDE 0.9 % IV SOLN: 250.0000 mL | INTRAVENOUS | Status: DC

## 2013-12-16 MED ORDER — ATORVASTATIN CALCIUM 20 MG PO TABS
20.0000 mg | ORAL_TABLET | Freq: Every day | ORAL | Status: DC
  Administered 2013-12-17: 20 mg via ORAL
  Filled 2013-12-16 (×3): qty 1

## 2013-12-16 MED ORDER — OMEGA-3-ACID ETHYL ESTERS 1 G PO CAPS
1.0000 g | ORAL_CAPSULE | Freq: Every day | ORAL | Status: DC
  Administered 2013-12-17: 1 g via ORAL
  Filled 2013-12-16 (×2): qty 1

## 2013-12-16 MED ORDER — METFORMIN HCL 500 MG PO TABS
500.0000 mg | ORAL_TABLET | Freq: Every day | ORAL | Status: DC
  Administered 2013-12-18: 500 mg via ORAL
  Filled 2013-12-16 (×3): qty 1

## 2013-12-16 MED ORDER — QUINAPRIL HCL 10 MG PO TABS: 40.0000 mg | ORAL_TABLET | Freq: Every day | ORAL | Status: DC

## 2013-12-16 MED ORDER — ACETAMINOPHEN 325 MG PO TABS
650.0000 mg | ORAL_TABLET | Freq: Once | ORAL | Status: AC
  Administered 2013-12-16: 650 mg via ORAL
  Filled 2013-12-16: qty 2

## 2013-12-16 MED ORDER — CELECOXIB 200 MG PO CAPS
200.0000 mg | ORAL_CAPSULE | Freq: Two times a day (BID) | ORAL | Status: DC
  Administered 2013-12-16 – 2013-12-17 (×3): 200 mg via ORAL
  Filled 2013-12-16 (×6): qty 1

## 2013-12-16 MED ORDER — SODIUM CHLORIDE 0.9 % IJ SOLN: 3.0000 mL | INTRAMUSCULAR | Status: DC | PRN

## 2013-12-16 MED ORDER — HEPARIN (PORCINE) IN NACL 100-0.45 UNIT/ML-% IJ SOLN
1350.0000 [IU]/h | INTRAMUSCULAR | Status: DC
  Administered 2013-12-16: 1000 [IU]/h via INTRAVENOUS
  Filled 2013-12-16 (×3): qty 250

## 2013-12-16 MED ORDER — CANAGLIFLOZIN 100 MG PO TABS
100.0000 mg | ORAL_TABLET | Freq: Every day | ORAL | Status: DC
  Filled 2013-12-16 (×2): qty 1

## 2013-12-16 MED ORDER — ESTRADIOL 1 MG PO TABS
0.5000 mg | ORAL_TABLET | Freq: Every day | ORAL | Status: DC
  Administered 2013-12-17: 0.5 mg via ORAL
  Filled 2013-12-16 (×2): qty 0.5

## 2013-12-16 MED ORDER — METFORMIN HCL 500 MG PO TABS
1000.0000 mg | ORAL_TABLET | Freq: Every day | ORAL | Status: DC
  Administered 2013-12-17: 1000 mg via ORAL
  Filled 2013-12-16 (×3): qty 2

## 2013-12-16 MED ORDER — LINAGLIPTIN 5 MG PO TABS
2.5000 mg | ORAL_TABLET | Freq: Every day | ORAL | Status: DC
  Administered 2013-12-18: 2.5 mg via ORAL
  Filled 2013-12-16 (×3): qty 1

## 2013-12-16 MED ORDER — ISOSORBIDE MONONITRATE ER 30 MG PO TB24
30.0000 mg | ORAL_TABLET | Freq: Every day | ORAL | Status: DC
  Administered 2013-12-17: 30 mg via ORAL
  Filled 2013-12-16 (×2): qty 1

## 2013-12-16 MED ORDER — LINAGLIPTIN 5 MG PO TABS
5.0000 mg | ORAL_TABLET | Freq: Every day | ORAL | Status: DC
  Administered 2013-12-17: 5 mg via ORAL
  Filled 2013-12-16 (×4): qty 1

## 2013-12-16 MED ORDER — SITAGLIPTIN PHOS-METFORMIN HCL 50-1000 MG PO TABS: 1.0000 | ORAL_TABLET | Freq: Every day | ORAL | Status: DC

## 2013-12-16 MED ORDER — HEPARIN BOLUS VIA INFUSION
4000.0000 [IU] | Freq: Once | INTRAVENOUS | Status: AC
  Administered 2013-12-16: 4000 [IU] via INTRAVENOUS
  Filled 2013-12-16: qty 4000

## 2013-12-16 MED ORDER — HYDROCORTISONE 1 % EX CREA
1.0000 "application " | TOPICAL_CREAM | Freq: Three times a day (TID) | CUTANEOUS | Status: DC | PRN
  Filled 2013-12-16: qty 28

## 2013-12-16 MED ORDER — AMLODIPINE BESYLATE 10 MG PO TABS
10.0000 mg | ORAL_TABLET | Freq: Every day | ORAL | Status: DC
  Administered 2013-12-17: 10 mg via ORAL
  Filled 2013-12-16 (×2): qty 1

## 2013-12-16 MED ORDER — LISINOPRIL 40 MG PO TABS
40.0000 mg | ORAL_TABLET | Freq: Every day | ORAL | Status: DC
  Administered 2013-12-16 – 2013-12-17 (×2): 40 mg via ORAL
  Filled 2013-12-16 (×3): qty 1

## 2013-12-16 MED ORDER — DILTIAZEM HCL 100 MG IV SOLR
5.0000 mg/h | INTRAVENOUS | Status: DC
  Administered 2013-12-16 – 2013-12-17 (×2): 5 mg/h via INTRAVENOUS
  Filled 2013-12-16: qty 100

## 2013-12-16 MED ORDER — ESTRADIOL 1 MG PO TABS: 0.5000 mg | ORAL_TABLET | Freq: Every day | ORAL | Status: DC

## 2013-12-16 NOTE — H&P (Signed)
Pt. Seen and examined. Agree with the NP/PA-C note as written.  Pleasant 63 yo female with history of CAD s/p PCI to distal LCx in 2009. She has been maintained on aspirin and Effient. She has recently been having palpitations and mildly dyspneic, which she attributed to asthma/chronic bronchitis. On admission, she was noted to be in a-fib with RVR in the 120's - no clear date of onset is known, however, her symptoms have persisted for several weeks. She does have a thyroid goiter - TSH is pending. I recommend admission, heparinization and plan for TEE/Cardioversion with me tomorrow. She is agreeable to this approach. She is interested in going on Eliquis for long-term anticoagulation - I would use this with aspirin, but discontinue Effient.  Pixie Casino, MD, [facility] Attending Cardiologist [place]

## 2013-12-16 NOTE — ED Provider Notes (Signed)
CSN: [id number]     Arrival date & time 12/16/13  1324 History   First MD Initiated Contact with Patient 12/16/13 1425     Chief Complaint  Patient presents with  . Abnormal ECG     (Consider location/radiation/quality/duration/timing/severity/associated sxs/prior Treatment) Patient is a 63 y.o. female presenting with palpitations. The history is provided by the patient.  Palpitations Palpitations quality:  Irregular Onset quality:  Sudden Timing:  Intermittent Progression:  Worsening Chronicity:  New Context: not exercise and not hyperventilation   Context comment:  New diabetes medication Relieved by:  Nothing Worsened by:  Nothing tried Associated symptoms: chest pain   Associated symptoms: no dizziness, no nausea, no syncope and no vomiting     Past Medical History  Diagnosis Date  . Arthritis   . Asthma   . GERD (gastroesophageal reflux disease)   . Heart murmur   . Hyperlipidemia   . Hypertension   . Pneumonia 2008  . Goiter   . Diabetes mellitus     Type 2  . Anemia, unspecified   . Chronic low back pain   . Polycystic ovary disease     Hysterectomy in 1982 for this  . Bell's palsy     Facial nerve decompression in 2001  . Shortness of breath dyspnea     ECHO 02/26/08 showed only minor abnormalities  . Coronary artery disease     Myoview 04/12/11 was entirely normal. ECHO 02/26/08 showed only minor abnormalities. Stenting 05/26/08 of her posterolateral branch to the left circumflex coronary artery. Used a 2.5x34mm Taxus Monorail stent.myoview 2014 was without ischemia  . COPD with asthma   . Glaucoma   . Spinal stenosis   . History of nuclear stress test 2012; 2014    lexiscan; normal pattern of perfusion; normal, low risk scan    Past Surgical History  Procedure Laterality Date  . Abdominal hysterectomy  1982    & BSO; for polycystic ovary disease  . Facial nerve decompression  2001/2002    bells palsy   . Laparoscopic cholecystectomy  06/15/2011     Dr  Dalbert Batman  . Colonoscopy      last 2009; Dr Cristina Gong; due 2019  . Upper gi endoscopy  2009    negative  . Coronary angioplasty  05/26/2008    Stenting of her posterolateral branch to the left circumflex coronary artery. Used a 2.5x102mm Taxus Monorail stent.  . Transthoracic echocardiogram  3/50/0938    LV systolic function normal with mild conc LVH; LA mildly dilated; trace MR/TR   Family History  Problem Relation Age of Onset  . Heart attack Father 52    2nd MI at 37  . Colon cancer Brother 84  . Gout Brother   . Diabetes Neg Hx   . Stroke Neg Hx   . Ulcers Mother   . Emphysema Mother 7  . Colon polyps Sister   . Cancer Sister     Basal cell carcinoma  . Pneumonia Maternal Grandmother   . Hypertension Brother   . Hyperlipidemia Brother   . Cancer Brother     Skin   History  Substance Use Topics  . Smoking status: Former Smoker -- 1.50 packs/day for 30 years    Types: Cigarettes    Quit date: 08/13/1993  . Smokeless tobacco: Never Used  . Alcohol Use: No   OB History   Grav Para Term Preterm Abortions TAB SAB Ect Mult Living  Review of Systems  Constitutional: Negative for fever and chills.  Cardiovascular: Positive for chest pain and palpitations. Negative for syncope.  Gastrointestinal: Negative for nausea and vomiting.  Neurological: Negative for dizziness.  All other systems reviewed and are negative.     Allergies  Benadryl; Clopidogrel bisulfate; and Contrast media  Home Medications   Prior to Admission medications   Medication Sig Start Date End Date Taking? Authorizing Provider  albuterol (PROVENTIL,VENTOLIN) 90 MCG/ACT inhaler Inhale 2 puffs into the lungs every 6 (six) hours as needed. For shortness of breath    Historical Provider, MD  amLODipine (NORVASC) 10 MG tablet TAKE (1) TABLET DAILY FOR HIGH BLOOD PRESSURE. 09/15/13   Hendricks Limes, MD  ascorbic acid (VITAMIN C) 1000 MG tablet Take 1,000 mg by mouth 2 (two) times daily.       Historical Provider, MD  aspirin 325 MG tablet Take 325 mg by mouth every morning.     Historical Provider, MD  atenolol (TENORMIN) 50 MG tablet TAKE 1 TABLET ONCE DAILY. 09/14/13   Lorretta Harp, MD  bimatoprost (LUMIGAN) 0.01 % SOLN Place 1 drop into both eyes at bedtime.     Historical Provider, MD  budesonide-formoterol (SYMBICORT) 160-4.5 MCG/ACT inhaler Inhale 2 puffs into the lungs 2 (two) times daily. 08/21/13   Darlyne Russian, MD  CALCIUM-MAG-VIT C-VIT D PO Take 2 tablets by mouth daily.      Historical Provider, MD  Canagliflozin (INVOKANA) 100 MG TABS Take 1 tablet (100 mg total) by mouth daily. 11/23/13   Hendricks Limes, MD  CELEBREX 200 MG capsule Take 200 mg by mouth 2 (two) times daily.  04/23/11   Historical Provider, MD  clotrimazole-betamethasone (LOTRISONE) cream Apply 1 application topically 2 (two) times daily. 11/23/13   Hendricks Limes, MD  estradiol (ESTRACE) 0.5 MG tablet Take 0.5 mg by mouth every morning.  04/23/11   Historical Provider, MD  guaiFENesin (MUCINEX) 600 MG 12 hr tablet Take 600 mg by mouth 2 (two) times daily.    Historical Provider, MD  isosorbide mononitrate (IMDUR) 30 MG 24 hr tablet Take 1 tablet (30 mg total) by mouth daily. 05/13/13   Cecilie [name], NP  levalbuterol [facility] HFA) 45 MCG/ACT inhaler Inhale 1-2 puffs into the lungs every 6 (six) hours as needed for wheezing. 06/25/13   Mancel Bale, PA-C  levalbuterol (XOPENEX) 0.63 MG/3ML nebulizer solution Take 3 mLs (0.63 mg total) by nebulization every 6 (six) hours as needed for wheezing or shortness of breath. 08/21/13   Darlyne Russian, MD  LORazepam (ATIVAN) 0.5 MG tablet Take 1 mg by mouth at bedtime. 1-2 TABS BY MOUTH IN THE EVENING 04/23/11   Historical Provider, MD  Melatonin 5 MG TABS Take 1 tablet by mouth at bedtime.      Historical Provider, MD  Multiple Vitamins-Iron (MULTIVITAMINS WITH IRON) TABS Take 1 tablet by mouth 2 (two) times daily.      Historical Provider, MD  mupirocin ointment  (BACTROBAN) 2 % USE ONCE DAILY AS DIRECTED. 10/03/12   Hendricks Limes, MD  omega-3 acid ethyl esters (LOVAZA) 1 G capsule Take 1 g by mouth daily.      Historical Provider, MD  omeprazole (PRILOSEC) 20 MG capsule Take 20 mg by mouth daily.      Historical Provider, MD  [facility] DELICA LANCETS FINE MISC 1 Units by Does not apply route as directed. 10/05/11   Midge Minium, MD  OVER THE COUNTER MEDICATION Take 1  packet by mouth daily. [place]     Historical Provider, MD  PARoxetine (PAXIL) 10 MG tablet Take 10 mg by mouth every morning. 2 1/2 TAB BY MOUTH DAILY    Historical Provider, MD  prasugrel (EFFIENT) 10 MG TABS tablet Take 1 tablet (10 mg total) by mouth at bedtime. 09/14/13   Lorretta Harp, MD  quinapril (ACCUPRIL) 40 MG tablet TAKE 1 TABLET ONCE DAILY. 07/18/13   Hendricks Limes, MD  rosuvastatin (CRESTOR) 10 MG tablet Take 1 tablet (10 mg total) by mouth at bedtime. 08/19/13   Lorretta Harp, MD  senna (SENOKOT) 8.6 MG TABS Take 1 tablet by mouth daily.      Historical Provider, MD  sitaGLIPtin-metformin (JANUMET) 50-1000 MG per tablet Take 0.5 tablet (500mg ) with morning meal daily & take 1 tablet (1,000mg ) with evening meal daily. 09/11/13   Hendricks Limes, MD   BP 148/78  Pulse 125  Temp(Src) 98.2 F (36.8 C) (Oral)  Resp 18  SpO2 100% Physical Exam  Nursing note and vitals reviewed. Constitutional: She is oriented to person, place, and time. She appears well-developed and well-nourished. No distress.  HENT:  Head: Normocephalic and atraumatic.  Eyes: EOM are normal. Pupils are equal, round, and reactive to light.  Neck: Normal range of motion. Neck supple.  Cardiovascular: An irregularly irregular rhythm present. Tachycardia present.  Exam reveals no friction rub.   No murmur heard. Pulmonary/Chest: Effort normal and breath sounds normal. No respiratory distress. She has no wheezes. She has no rales.  Abdominal: Soft. She exhibits no distension. There is  no tenderness. There is no rebound.  Musculoskeletal: Normal range of motion. She exhibits no edema.  Neurological: She is alert and oriented to person, place, and time.  Skin: She is not diaphoretic.    ED Course  Procedures (including critical care time) Labs Review Labs Reviewed  CBC - Abnormal; Notable for the following:    HCT 35.5 (*)    All other components within normal limits  BASIC METABOLIC PANEL - Abnormal; Notable for the following:    Sodium 132 (*)    Glucose, Bld 191 (*)    GFR calc non Af Amer 89 (*)    All other components within normal limits  PRO B NATRIURETIC PEPTIDE  I-STAT TROPOININ, ED    Imaging Review Dg Chest 2 View  12/16/2013   CLINICAL DATA:  Chest pain and shortness of breath.  EXAM: CHEST  2 VIEW  COMPARISON:  PA and lateral chest 04/18/2011.  FINDINGS: There is some peribronchial thickening. No consolidative process, pneumothorax or effusion. Heart size is normal.  IMPRESSION: Chronic bronchitic change.  No acute finding.   Electronically Signed   By: Inge Rise M.D.   On: 12/16/2013 14:44     EKG Interpretation   Date/Time:  Wednesday Dec 16 2013 13:27:48 EDT Ventricular Rate:  129 PR Interval:    QRS Duration: 80 QT Interval:  306 QTC Calculation: 448 R Axis:   10 Text Interpretation:  Atrial fibrillation with rapid ventricular response  ST abnormality, possible digitalis effect Abnormal ECG Afib new Confirmed  by Mingo Amber  MD, [place] ([id number]) on 12/16/2013 3:52:14 PM     CRITICAL CARE Performed by: Osvaldo Shipper   Total critical care time: 30 minutes  Critical care time was exclusive of separately billable procedures and treating other patients.  Critical care was necessary to treat or prevent imminent or life-threatening deterioration.  Critical care was time spent personally by  me on the following activities: development of treatment plan with patient and/or surrogate as well as nursing, discussions with consultants,  evaluation of patient's response to treatment, examination of patient, obtaining history from patient or surrogate, ordering and performing treatments and interventions, ordering and review of laboratory studies, ordering and review of radiographic studies, pulse oximetry and re-evaluation of patient's condition.  MDM   Final diagnoses:  Atrial fibrillation with RVR    26F presents with Afib with RVR. Sent from Dr. Clayborn Heron office today - has been having intermittent palpitations, malaise for past 3 weeks. Started new diabetes medication at that time. Has noticed palpitations after exertion with general malaise lasting 30-60 minutes at a time. Here with irregularly irregular heart rate, tachy  From the 90s-140s. BP ok. Will 3 weeks of intermittent palpitations, will start with rate control, Cards consult. HR under control with [place].   Osvaldo Shipper, MD 12/16/13 [phone]

## 2013-12-16 NOTE — Progress Notes (Signed)
   Subjective:    Patient ID: [name], female    DOB: 1951-06-01, 63 y.o.   MRN: [id number]  HPI   She states that she has felt "awful" the last several weeks and questions whether it's related to the new diabetic medicine, Invokana.  She's describing intermittent panic attacks with palpitations, dyspnea, chest tightness. She's had these almost daily for the last 3 weeks. She states it takes one half to one hour to recover. The last episode was yesterday; but she feels some milder symptoms @ present.  The symptoms have been associated with nausea and sweating. They are exertional in nature  She had a similar issue last year; a "chemical stress test" was negative.  She is on Effient.  Review of Systems   Her fasting blood sugars range from 148-181. Highest glucose 2 hours after meals was 232.  Blood pressures range from 119/66-151/59..         Objective:   Physical Exam Appears  well-nourished & in no acute distress. Weight excess centrally  No carotid bruits are present.No neck pain distention present at 10 - 15 degrees. Thyroid normal to palpation  Heart rhythm and rate are rapid & irregular  with no gallop or murmur. R carotid bruit  Chest is clear with no increased work of breathing  There is no evidence of aortic aneurysm or renal artery bruits  Abdomen soft with no organomegaly or masses. No HJR  No clubbing, cyanosis or edema present.  Pedal pulses are intact . Homan's negative  No ischemic skin changes are present . Fingernails and healthy   Alert and oriented. Strength, tone, DTRs reflexes normal          Assessment & Plan:  #1 paroxysmal exertional dyspnea, palpitations, chest tightness  #2 clinical atrial fib  #3 diabetes; questionable reaction to any medication  Plan: See orders  EKG reveals atrial flutter/fibrillation with rates up to 132. As noted she's beginning to have some mild version of the symptoms which have been recurrent for the  last 3 weeks almost daily.  I had planned to start get electrolytes and thyroid function test with Cardiology referral. Because of the uncontrolled rate she was referred to the common ER.

## 2013-12-16 NOTE — H&P (Signed)
Primary Cardiologist: Dr. Gwenlyn Found  PCP: Dr. Huey Bienenstock   Chief Complaint: Palpitations, Fatigue, DOE   HPI: The patient is a 63 y/o female, followed by Dr. Gwenlyn Found, who presents to the [facility] ER with a complaint of intermittent palpitations, fatigue and DOE, over the last 2-3 months, that has progressively worsened. EKG demonstrates atrial fibrillation w/ RVR.   She has no prior history of atrial fibrillation. Her past cardiac history is significant for CAD, s/p PCI to the distal circumflex posterolateral branch, by Dr. Melvern Banker, in 2009. At that time, she was noted to have residual disease in the PDA branch and had normal systolic function. She was restudied by cath, by Dr. Gwenlyn Found, in 2012, in the setting of chest pain. The previously placed stent was widely patent. There was a 50% distal PDA lesion, felt to be stable from her previous cath in 2009. She had a NST in October 2014, which was a low risk study. LV function was normal with an EF of 67%. Wall motion was also normal. Her other past medical history is significant for DM, HTN, HLD, adult onset asthma and remote tobacco use, having quit nearly 20 years ago.   In the ER, initial EKG demonstrated a ventricular rate of 123 bpm. IV Cardizem was initiated. HR has improved into the 60s-70s. However, she remains in Atrial fibrillation. BP is stable. She continues to feel poorly. She denies any excessive caffeine or alcohol intake. No history of OSA, that she is aware of. When asked about thyroid disorders, she states that she has a goiter. She is not on any thyroid medications. She denies any recent fever, chills, n/v/d. She does note a recent history of cloudy urine with a slight abnormal odor. She denies dysuria.  In addition to palpitations, fatigue and DOE, she notes occasional dizziness, but denies syncope/near syncope. She has had occasional chest tightness, but contributes this to her asthma. She has refrained from albuterol use for fear of rebound tachycardia/  worsening palpitations. She denies orthopnea, PND and LEE. No past history of stroke or TIA. No history of abnormal bleeding, including no h/o GIBs. No melena or hematochezia. No history of falls.    Past Medical History   Diagnosis  Date   .  Arthritis    .  Asthma    .  GERD (gastroesophageal reflux disease)    .  Heart murmur    .  Hyperlipidemia    .  Hypertension    .  Pneumonia  2008   .  Goiter    .  Diabetes mellitus      Type 2   .  Anemia, unspecified    .  Chronic low back pain    .  Polycystic ovary disease      Hysterectomy in 1982 for this   .  Bell's palsy      Facial nerve decompression in 2001   .  Shortness of breath dyspnea      ECHO 02/26/08 showed only minor abnormalities   .  Coronary artery disease      Myoview 04/12/11 was entirely normal. ECHO 02/26/08 showed only minor abnormalities. Stenting 05/26/08 of her posterolateral branch to the left circumflex coronary artery. Used a 2.5x13m Taxus Monorail stent.myoview 2014 was without ischemia   .  COPD with asthma    .  Glaucoma    .  Spinal stenosis    .  History of nuclear stress test  2012; 2014     lexiscan; normal pattern  of perfusion; normal, low risk scan    Past Surgical History   Procedure  Laterality  Date   .  Abdominal hysterectomy   1982     & BSO; for polycystic ovary disease   .  Facial nerve decompression   2001/2002     bells palsy   .  Laparoscopic cholecystectomy   06/15/2011     Dr Dalbert Batman   .  Colonoscopy       last 2009; Dr Cristina Gong; due 2019   .  Upper gi endoscopy   2009     negative   .  Coronary angioplasty   05/26/2008     Stenting of her posterolateral branch to the left circumflex coronary artery. Used a 2.5x72m Taxus Monorail stent.   .  Transthoracic echocardiogram   78/41/3244    LV systolic function normal with mild conc LVH; LA mildly dilated; trace MR/TR    Family History   Problem  Relation  Age of Onset   .  Heart attack  Father  472     2ndMI at 657  .  Colon  cancer  Brother  714  .  Gout  Brother    .  Diabetes  Neg Hx    .  Stroke  Neg Hx    .  Ulcers  Mother    .  Emphysema  Mother  769  .  Colon polyps  Sister    .  Cancer  Sister      Basal cell carcinoma   .  Pneumonia  Maternal Grandmother    .  Hypertension  Brother    .  Hyperlipidemia  Brother    .  Cancer  Brother      Skin    Social History: reports that she quit smoking about 20 years ago. Her smoking use included Cigarettes. She has a 45 pack-year smoking history. She has never used smokeless tobacco. She reports that she does not drink alcohol or use illicit drugs.  Allergies:  Allergies   Allergen  Reactions   .  Benadryl [Diphenhydramine Hcl]  Other (See Comments)     Restless leg   .  Clopidogrel Bisulfate      Nausea & pain   .  Contrast Media [Iodinated Diagnostic Agents]  Palpitations     Rapid heart rate, hot    Prior to Admission medications   Medication Sig Start Date End Date Taking? Authorizing Provider  amLODipine (NORVASC) 10 MG tablet Take 10 mg by mouth daily.   Yes Historical Provider, MD  ascorbic acid (VITAMIN C) 1000 MG tablet Take 1,000 mg by mouth 2 (two) times daily.    Yes Historical Provider, MD  aspirin 325 MG tablet Take 325 mg by mouth daily.    Yes Historical Provider, MD  atenolol (TENORMIN) 50 MG tablet Take 50 mg by mouth daily.   Yes Historical Provider, MD  bimatoprost (LUMIGAN) 0.01 % SOLN Place 1 drop into both eyes at bedtime.    Yes Historical Provider, MD  budesonide-formoterol (SYMBICORT) 160-4.5 MCG/ACT inhaler Inhale 2 puffs into the lungs 2 (two) times daily. 08/21/13  Yes SDarlyne Russian MD  CALCIUM-MAG-VIT C-VIT D PO Take 2 tablets by mouth daily.    Yes Historical Provider, MD  Canagliflozin (INVOKANA) 100 MG TABS Take 1 tablet (100 mg total) by mouth daily. 11/23/13  Yes WHendricks Limes MD  CELEBREX 200 MG capsule Take 200 mg by mouth 2 (two)  times daily.  04/23/11  Yes Historical Provider, MD  clotrimazole-betamethasone  (LOTRISONE) cream Apply 1 application topically 2 (two) times daily. 11/23/13  Yes Hendricks Limes, MD  estradiol (ESTRACE) 0.5 MG tablet Take 0.5 mg by mouth daily.  04/23/11  Yes Historical Provider, MD  guaiFENesin (MUCINEX) 600 MG 12 hr tablet Take 600 mg by mouth 2 (two) times daily.   Yes Historical Provider, MD  isosorbide mononitrate (IMDUR) 30 MG 24 hr tablet Take 1 tablet (30 mg total) by mouth daily. 05/13/13  Yes Cecilie [name], NP  levalbuterol [facility] HFA) 45 MCG/ACT inhaler Inhale 1-2 puffs into the lungs every 6 (six) hours as needed for wheezing. 06/25/13  Yes Mancel Bale, PA-C  levalbuterol (XOPENEX) 0.63 MG/3ML nebulizer solution Take 3 mLs (0.63 mg total) by nebulization every 6 (six) hours as needed for wheezing or shortness of breath. 08/21/13  Yes Darlyne Russian, MD  LORazepam (ATIVAN) 0.5 MG tablet Take 1 mg by mouth at bedtime.  04/23/11  Yes Historical Provider, MD  Melatonin 5 MG TABS Take 1 tablet by mouth at bedtime.    Yes Historical Provider, MD  Multiple Vitamins-Iron (MULTIVITAMINS WITH IRON) TABS Take 1 tablet by mouth 2 (two) times daily.    Yes Historical Provider, MD  mupirocin ointment (BACTROBAN) 2 % Apply 1 application topically daily.   Yes Historical Provider, MD  omega-3 acid ethyl esters (LOVAZA) 1 G capsule Take 1 g by mouth daily.    Yes Historical Provider, MD  omeprazole (PRILOSEC) 20 MG capsule Take 20 mg by mouth daily.    Yes Historical Provider, MD  OVER THE COUNTER MEDICATION Take 1 packet by mouth daily. SPIRU-TEIN SUPPLEMENT   Yes Historical Provider, MD  PARoxetine (PAXIL) 10 MG tablet Take 25 mg by mouth daily.    Yes Historical Provider, MD  prasugrel (EFFIENT) 10 MG TABS tablet Take 1 tablet (10 mg total) by mouth at bedtime. 09/14/13  Yes Lorretta Harp, MD  quinapril (ACCUPRIL) 40 MG tablet Take 40 mg by mouth daily.   Yes Historical Provider, MD  rosuvastatin (CRESTOR) 10 MG tablet Take 1 tablet (10 mg total) by mouth at bedtime. 08/19/13  Yes  Lorretta Harp, MD  senna (SENOKOT) 8.6 MG TABS Take 1 tablet by mouth daily.    Yes Historical Provider, MD  sitaGLIPtin-metformin (JANUMET) 50-1000 MG per tablet Take 0.5-1 tablets by mouth 2 (two) times daily with a meal. Take 0.5 tablet (573m) with morning meal daily & take 1 tablet (1,0072m with evening meal daily. 09/11/13  Yes WiHendricks LimesMD  ON[facility] CenterELICA LANCETS FINE MISC 1 Units by Does not apply route as directed. 10/05/11   KaMidge MiniumMD    Results for orders placed during the hospital encounter of 12/16/13 (from the past 48 hour(s))   CBC Status: Abnormal    Collection Time    12/16/13 1:30 PM   Result  Value  Ref Range    WBC  7.6  4.0 - 10.5 K/uL    RBC  4.14  3.87 - 5.11 MIL/uL    Hemoglobin  12.4  12.0 - 15.0 g/dL    HCT  35.5 (*)  36.0 - 46.0 %    MCV  85.7  78.0 - 100.0 fL    MCH  30.0  26.0 - 34.0 pg    MCHC  34.9  30.0 - 36.0 g/dL    RDW  13.3  11.5 - 15.5 %    Platelets  275  150 - 400 K/uL   BASIC METABOLIC PANEL Status: Abnormal    Collection Time    12/16/13 1:30 PM   Result  Value  Ref Range    Sodium  132 (*)  137 - 147 mEq/L    Potassium  4.8  3.7 - 5.3 mEq/L    Chloride  99  96 - 112 mEq/L    CO2  22  19 - 32 mEq/L    Glucose, Bld  191 (*)  70 - 99 mg/dL    BUN  20  6 - 23 mg/dL    Creatinine, Ser  0.70  0.50 - 1.10 mg/dL    Calcium  9.2  8.4 - 10.5 mg/dL    GFR calc non Af Amer  89 (*)  >90 mL/min    GFR calc Af Amer  >90  >90 mL/min    Comment:  (NOTE)     The eGFR has been calculated using the CKD EPI equation.     This calculation has not been validated in all clinical situations.     eGFR's persistently <90 mL/min signify possible Chronic Kidney     Disease.   Randolm Idol, ED Status: None    Collection Time    12/16/13 1:44 PM   Result  Value  Ref Range    Troponin i, poc  0.01  0.00 - 0.08 ng/mL    Comment 3      Comment:  Due to the release kinetics of cTnI,     a negative result within the first hours     of  the onset of symptoms does not rule out     myocardial infarction with certainty.     If myocardial infarction is still suspected,     repeat the test at appropriate intervals.   CBG MONITORING, ED Status: Abnormal    Collection Time    12/16/13 2:37 PM   Result  Value  Ref Range    Glucose-Capillary  153 (*)  70 - 99 mg/dL    Dg Chest 2 View  12/16/2013 CLINICAL DATA: Chest pain and shortness of breath. EXAM: CHEST 2 VIEW COMPARISON: PA and lateral chest 04/18/2011. FINDINGS: There is some peribronchial thickening. No consolidative process, pneumothorax or effusion. Heart size is normal. IMPRESSION: Chronic bronchitic change. No acute finding. Electronically Signed By: Inge Rise M.D. On: 12/16/2013 14:44   Review of Systems  Constitutional: Positive for malaise/fatigue. Negative for fever and chills.  Respiratory: Positive for shortness of breath.  Cardiovascular: Positive for chest pain and palpitations. Negative for orthopnea, leg swelling and PND.  Genitourinary: Negative for dysuria.  Neurological: Positive for dizziness. Negative for loss of consciousness.  All other systems reviewed and are negative.   Blood pressure 110/46, pulse 125, temperature 98.2 F (36.8 C), temperature source Oral, resp. rate 16, SpO2 100.00%.  Physical Exam  Constitutional: She is oriented to person, place, and time. She appears well-developed and well-nourished. No distress.  Neck: No JVD present. Carotid bruit is not present.  Cardiovascular: An irregularly irregular rhythm present. Tachycardia present. Exam reveals no gallop and no friction rub.  No murmur heard.  Pulses:  Radial pulses are 2+ on the right side, and 2+ on the left side.  Dorsalis pedis pulses are 2+ on the right side, and 2+ on the left side.  Respiratory: Effort normal and breath sounds normal. No respiratory distress. She has no wheezes. She has no rales.  Musculoskeletal: She exhibits no edema.  Neurological:  She is alert  and oriented to person, place, and time.  Skin: Skin is warm and dry. She is not diaphoretic.  Psychiatric: She has a normal mood and affect. Her behavior is normal.   Assessment/Plan  Principal Problem:  Atrial fibrillation with RVR  Active Problems:  Type II or unspecified type diabetes mellitus with peripheral circulatory disorders, uncontrolled(250.72)  HYPERLIPIDEMIA  HYPERTENSION  CAD (coronary artery disease), hx of LCX stent DES 2009, last cath 2012 50-60% RCA stenosis, patent stent, 30 % LM.    1. Atrial Fibrillation w/ RVR: new onset. Will admit for treatment and to perform w/u to identify potential causes. She is currently rate controlled on IV Cardizem. We will continue this, as long as BP allows, in hopes that she will spontaneously convert back to NSR. If not, would recommend DCCV to restore NSR, since she is obviously symptomatic. She will require a TEE prior to cardioversion, to rule out left atrial clot, as we are unsure of the exact onset. We will start anticoagulation with IV heparin. She has a CHA2DS2-VASc score of at least 4 (DM, HTN, Age, female sex) placing her at high risk for thromboembolic events. She will likely require an oral anticoagulant. With normal renal function, she would be a candidate for a NOAC. We will check a 2D echo, TSH, Mg, U/A and cycle troponin x 3.   2. CAD: Last cath in 2012 revealed patent distal circumflex posterolateral branch stent and stable PDA lesion. She had a low risk NST in November 2014. If 2D echo demonstrates normal LV function and no WMA, there is likely no need to repeat. Continue ASA, BB and statin.  3. HTN:  Controlled. Continue home meds.  4. DM: Continue current meds.  5. HLD: continue statin  Lyda Jester  12/16/2013, 4:38 PM

## 2013-12-16 NOTE — ED Notes (Signed)
Attempted to call report

## 2013-12-16 NOTE — ED Notes (Signed)
PA at bedside aware of BP and HR. Continue cardizem drip at 81mL/hr

## 2013-12-16 NOTE — Progress Notes (Signed)
ANTICOAGULATION CONSULT NOTE - Initial Consult  Pharmacy Consult for heparin Indication: atrial fibrillation  Allergies  Allergen Reactions  . Benadryl [Diphenhydramine Hcl] Other (See Comments)    Restless leg  . Clopidogrel Bisulfate     Nausea & pain  . Contrast Media [Iodinated Diagnostic Agents] Palpitations    Rapid heart rate, hot    Patient Measurements:   Heparin Dosing Weight: 71.2kg  Vital Signs: Temp: 98.2 F (36.8 C) (05/06 1359) Temp src: Oral (05/06 1359) BP: 103/49 mmHg (05/06 1854) Pulse Rate: 51 (05/06 1800)  Labs:  Recent Labs  12/16/13 1330  HGB 12.4  HCT 35.5*  PLT 275  CREATININE 0.70    The CrCl is unknown because both a height and weight (above a minimum accepted value) are required for this calculation.   Medical History: Past Medical History  Diagnosis Date  . Arthritis   . Asthma   . GERD (gastroesophageal reflux disease)   . Heart murmur   . Hyperlipidemia   . Hypertension   . Pneumonia 2008  . Goiter   . Diabetes mellitus     Type 2  . Anemia, unspecified   . Chronic low back pain   . Polycystic ovary disease     Hysterectomy in 1982 for this  . Bell's palsy     Facial nerve decompression in 2001  . Shortness of breath dyspnea     ECHO 02/26/08 showed only minor abnormalities  . Coronary artery disease     Myoview 04/12/11 was entirely normal. ECHO 02/26/08 showed only minor abnormalities. Stenting 05/26/08 of her posterolateral branch to the left circumflex coronary artery. Used a 2.5x46mm Taxus Monorail stent.myoview 2014 was without ischemia  . COPD with asthma   . Glaucoma   . Spinal stenosis   . History of nuclear stress test 2012; 2014    lexiscan; normal pattern of perfusion; normal, low risk scan     Medications:  Infusions:  . diltiazem (CARDIZEM) infusion 5 mg/hr (12/16/13 1511)  . heparin    . heparin      Assessment: 62 yof presented to the ED with fatigue, DOE and palpitations. Found to be in afib and  to start on IV heparin for anticoagulation. CBC is WNL and she is not on any anticoagulants PTA. Planning cardioversion tomorrow.   Goal of Therapy:  Heparin level 0.3-0.7 units/ml Monitor platelets by anticoagulation protocol: Yes   Plan:  1. Heparin bolus 4000 units IV x 1 2. Heparin gtt 1000 units/hr 3. Check a 6 hour heparin level 4. Daily heparin level and CBC  Rande Lawman Senon Nixon 12/16/2013,7:09 PM

## 2013-12-16 NOTE — ED Notes (Signed)
Pt in from Dr. Darrol Angel office with abnormal EKG, pt went there due to chest pain and increased shortness of breath over the last few months, worse in the last two weeks, went to MD today and AFib was noted on EKG and patient has no history of same, rates chest pain 3/10 at this time, mild shortness of breath, also nausea over the last few days

## 2013-12-16 NOTE — Patient Instructions (Addendum)
Go to ER now; I'll contact Triage Nurse @ [place].

## 2013-12-16 NOTE — ED Notes (Signed)
Pt given turkey sandwich

## 2013-12-16 NOTE — Progress Notes (Signed)
Pre visit review using our clinic review tool, if applicable. No additional management support is needed unless otherwise documented below in the visit note. 

## 2013-12-17 ENCOUNTER — Encounter (HOSPITAL_COMMUNITY): Payer: Medicare Other | Admitting: Certified Registered"

## 2013-12-17 ENCOUNTER — Encounter (HOSPITAL_COMMUNITY)
Admission: EM | Disposition: A | Discharge status: Home / Self Care | Payer: Self-pay | Source: Home / Self Care | Attending: Internal Medicine

## 2013-12-17 ENCOUNTER — Inpatient Hospital Stay (HOSPITAL_COMMUNITY): Payer: Medicare Other | Admitting: Certified Registered"

## 2013-12-17 ENCOUNTER — Encounter (HOSPITAL_COMMUNITY): Payer: Self-pay | Admitting: Certified Registered"

## 2013-12-17 DIAGNOSIS — E1159 Type 2 diabetes mellitus with other circulatory complications: Secondary | ICD-10-CM

## 2013-12-17 HISTORY — PX: TEE WITHOUT CARDIOVERSION: SHX5443

## 2013-12-17 HISTORY — PX: CARDIOVERSION: SHX1299

## 2013-12-17 LAB — BASIC METABOLIC PANEL
BUN: 18 mg/dL (ref 6–23)
CO2: 22 mEq/L (ref 19–32)
Calcium: 8.6 mg/dL (ref 8.4–10.5)
Chloride: 97 mEq/L (ref 96–112)
Creatinine, Ser: 0.7 mg/dL (ref 0.50–1.10)
GFR calc Af Amer: >90 mL/min (ref >90)
GFR calc non Af Amer: 89 mL/min — ABNORMAL LOW (ref >90)
Glucose, Bld: 166 mg/dL — ABNORMAL HIGH (ref 70–99)
Potassium: 4.1 mEq/L (ref 3.7–5.3)
Sodium: 131 mEq/L — ABNORMAL LOW (ref 137–147)

## 2013-12-17 LAB — CBC
HCT: 33.6 % — ABNORMAL LOW (ref 36.0–46.0)
Hemoglobin: 11.8 g/dL — ABNORMAL LOW (ref 12.0–15.0)
MCH: 29.8 pg (ref 26.0–34.0)
MCHC: 35.1 g/dL (ref 30.0–36.0)
MCV: 84.8 fL (ref 78.0–100.0)
Platelets: 267 10*3/uL (ref 150–400)
RBC: 3.96 MIL/uL (ref 3.87–5.11)
RDW: 13.2 % (ref 11.5–15.5)
WBC: 7.6 10*3/uL (ref 4.0–10.5)

## 2013-12-17 LAB — GLUCOSE, CAPILLARY
Glucose-Capillary: 155 mg/dL — ABNORMAL HIGH (ref 70–99)
Glucose-Capillary: 162 mg/dL — ABNORMAL HIGH (ref 70–99)
Glucose-Capillary: 157 mg/dL — ABNORMAL HIGH (ref 70–99)
Glucose-Capillary: 112 mg/dL — ABNORMAL HIGH (ref 70–99)

## 2013-12-17 LAB — HEPARIN LEVEL (UNFRACTIONATED)
Heparin Unfractionated: 0.2 IU/mL — ABNORMAL LOW (ref 0.30–0.70)
Heparin Unfractionated: 0.27 IU/mL — ABNORMAL LOW (ref 0.30–0.70)

## 2013-12-17 SURGERY — ECHOCARDIOGRAM, TRANSESOPHAGEAL
Anesthesia: Monitor Anesthesia Care

## 2013-12-17 MED ORDER — BUTAMBEN-TETRACAINE-BENZOCAINE 2-2-14 % EX AERO
INHALATION_SPRAY | CUTANEOUS | Status: DC | PRN
  Administered 2013-12-17: 1 via TOPICAL

## 2013-12-17 MED ORDER — PROPOFOL 10 MG/ML IV BOLUS
INTRAVENOUS | Status: DC | PRN
  Administered 2013-12-17: 30 mg via INTRAVENOUS
  Administered 2013-12-17 (×2): 20 mg via INTRAVENOUS

## 2013-12-17 MED ORDER — SODIUM CHLORIDE 0.9 % IV SOLN: INTRAVENOUS | Status: DC

## 2013-12-17 MED ORDER — FENTANYL CITRATE 0.05 MG/ML IJ SOLN
INTRAMUSCULAR | Status: DC | PRN
  Administered 2013-12-17: 50 ug via INTRAVENOUS

## 2013-12-17 MED ORDER — RIVAROXABAN 20 MG PO TABS
20.0000 mg | ORAL_TABLET | Freq: Every day | ORAL | Status: DC
  Administered 2013-12-17: 20 mg via ORAL
  Filled 2013-12-17 (×2): qty 1

## 2013-12-17 MED ORDER — PROPOFOL 10 MG/ML IV EMUL
INTRAVENOUS | Status: AC
  Filled 2013-12-17: qty 100

## 2013-12-17 MED ORDER — SODIUM CHLORIDE 0.9 % IV SOLN
Freq: Once | INTRAVENOUS | Status: AC
  Administered 2013-12-17: 14:00:00 via INTRAVENOUS

## 2013-12-17 MED ORDER — PROPOFOL INFUSION 10 MG/ML OPTIME
INTRAVENOUS | Status: DC | PRN
  Administered 2013-12-17: 100 ug/kg/min via INTRAVENOUS

## 2013-12-17 MED ORDER — ZOLPIDEM TARTRATE 5 MG PO TABS: 5.0000 mg | ORAL_TABLET | Freq: Every evening | ORAL | Status: DC | PRN

## 2013-12-17 MED ORDER — LIDOCAINE VISCOUS 2 % MT SOLN
OROMUCOSAL | Status: DC | PRN
  Administered 2013-12-17: 20 mL via OROMUCOSAL

## 2013-12-17 MED ORDER — HEPARIN BOLUS VIA INFUSION
2000.0000 [IU] | Freq: Once | INTRAVENOUS | Status: AC
  Administered 2013-12-17: 2000 [IU] via INTRAVENOUS
  Filled 2013-12-17: qty 2000

## 2013-12-17 NOTE — Progress Notes (Signed)
[place] for xarelto Indication: atrial fibrillation  Allergies  Allergen Reactions  . Benadryl [Diphenhydramine Hcl] Other (See Comments)    Restless leg  . Clopidogrel Bisulfate     Nausea & pain  . Contrast Media [Iodinated Diagnostic Agents] Palpitations    Rapid heart rate, hot    Patient Measurements: Height: 5\' 2"  (157.5 cm) Weight: 206 lb 2.1 oz (93.5 kg) IBW/kg (Calculated) : 50.1 Heparin Dosing Weight: 71.2kg  Vital Signs: Temp: 97.7 F (36.5 C) (05/07 1335) Temp src: Oral (05/07 1335) BP: 114/28 mmHg (05/07 1405) Pulse Rate: 51 (05/07 1400)  Labs:  Recent Labs  12/16/13 1330 12/17/13 0141 12/17/13 0144 12/17/13 1146  HGB 12.4 11.8*  --   --   HCT 35.5* 33.6*  --   --   PLT 275 267  --   --   HEPARINUNFRC  --   --  0.20* 0.27*  CREATININE 0.70 0.70  --   --     Estimated Creatinine Clearance: 74.7 ml/min (by C-G formula based on Cr of 0.7).  Assessment: 63 yo female who has been on heparin for afib. She is s/p DCCV. Plan is to switch to xarelto for anticoagulation.     Plan:   Dc heparin Start xarelto 20mg  PO qday Monitor for bleeding

## 2013-12-17 NOTE — Progress Notes (Signed)
SBP 80-90's Dr [name] aware with verbal order to give 250 ml normal saline  And discontinue cardizem drip- done

## 2013-12-17 NOTE — Progress Notes (Signed)
[place] for heparin Indication: atrial fibrillation  Allergies  Allergen Reactions  . Benadryl [Diphenhydramine Hcl] Other (See Comments)    Restless leg  . Clopidogrel Bisulfate     Nausea & pain  . Contrast Media [Iodinated Diagnostic Agents] Palpitations    Rapid heart rate, hot    Patient Measurements: Height: 5\' 2"  (157.5 cm) Weight: 206 lb 2.1 oz (93.5 kg) IBW/kg (Calculated) : 50.1 Heparin Dosing Weight: 71.2kg  Vital Signs: Temp: 98.2 F (36.8 C) (05/07 0525) Temp src: Oral (05/07 0525) BP: 130/52 mmHg (05/07 0900) Pulse Rate: 88 (05/07 0900)  Labs:  Recent Labs  12/16/13 1330 12/17/13 0141 12/17/13 0144 12/17/13 1146  HGB 12.4 11.8*  --   --   HCT 35.5* 33.6*  --   --   PLT 275 267  --   --   HEPARINUNFRC  --   --  0.20* 0.27*  CREATININE 0.70 0.70  --   --     Estimated Creatinine Clearance: 74.7 ml/min (by C-G formula based on Cr of 0.7).  Assessment: 63 yo female on heparin for afib. Heparin level slightly subtherapeutic (0.27) on 1200 units/hr. Noted that pt is currently in Endo suite for DCCV.  Goal of Therapy:  Heparin level 0.3-0.7 units/ml Monitor platelets by anticoagulation protocol: Yes   Plan:  1) Increase heparin to 1350 units/hr once pt back from DCCV unless heparin is discontinued. 2) Will f/u plans for NOAC post DCCV.  Sherlon Handing, PharmD, BCPS Clinical pharmacist, pager [phone] 12/17/2013,12:56 PM

## 2013-12-17 NOTE — Progress Notes (Signed)
Seen and examined. Plan as outlined by the H and P and also this note. We will d/c the DAPT and use NOAC.

## 2013-12-17 NOTE — Progress Notes (Signed)
Patient Name: [name] Date of Encounter: 12/17/2013     Principal Problem:   Atrial fibrillation with RVR Active Problems:   Type II or unspecified type diabetes mellitus with peripheral circulatory disorders, uncontrolled(250.72)   HYPERLIPIDEMIA   HYPERTENSION   CAD (coronary artery disease), hx of LCX stent DES 2009,  last cath 2012 50-60% RCA stenosis, patent stent, 30 % LM.   Atrial fibrillation with rapid ventricular response    SUBJECTIVE  Feeling a lot better with controlled rates. HR 80s currently.   CURRENT MEDS . amLODipine  10 mg Oral Daily  . aspirin EC  81 mg Oral Daily  . atenolol  50 mg Oral Daily  . atorvastatin  20 mg Oral q1800  . budesonide-formoterol  2 puff Inhalation BID  . Canagliflozin  100 mg Oral Daily  . celecoxib  200 mg Oral BID  . estradiol  0.5 mg Oral Daily  . isosorbide mononitrate  30 mg Oral Daily  . metFORMIN  500 mg Oral Q breakfast   And  . linagliptin  2.5 mg Oral Q breakfast  . linagliptin  5 mg Oral Q supper   And  . metFORMIN  1,000 mg Oral Q supper  . lisinopril  40 mg Oral Daily  . omega-3 acid ethyl esters  1 g Oral Daily  . pantoprazole  40 mg Oral Daily  . PARoxetine  25 mg Oral Daily  . sodium chloride  3 mL Intravenous Q12H    OBJECTIVE  Filed Vitals:   12/16/13 1933 12/16/13 2008 12/16/13 2102 12/17/13 0525  BP: 122/62 129/53 122/55 116/53  Pulse: 72 76 76 53  Temp:   97.5 F (36.4 C) 98.2 F (36.8 C)  TempSrc:   Oral Oral  Resp: 14 14 18 18   Height:   5\' 2"  (1.575 m)   Weight:   206 lb 2.1 oz (93.5 kg)   SpO2: 99% 99% 98% 99%    Intake/Output Summary (Last 24 hours) at 12/17/13 0825 Last data filed at 12/17/13 0526  Gross per 24 hour  Intake      0 ml  Output   2450 ml  Net  -2450 ml   Filed Weights   12/16/13 2102  Weight: 206 lb 2.1 oz (93.5 kg)    PHYSICAL EXAM  General: Pleasant, NAD. obese Neuro: Alert and oriented X 3. Moves all extremities spontaneously. Psych: Normal  affect. HEENT:  Normal  Neck: Supple without bruits or JVD. Lungs:  Resp regular and unlabored, CTA. Heart: RRR no s3, s4, or murmurs. Abdomen: Soft, non-tender, non-distended, BS + x 4.  Extremities: No clubbing, cyanosis or edema. DP/PT/Radials 2+ and equal bilaterally.  Accessory Clinical Findings  CBC  Recent Labs  12/16/13 1330 12/17/13 0141  WBC 7.6 7.6  HGB 12.4 11.8*  HCT 35.5* 33.6*  MCV 85.7 84.8  PLT 275 485   Basic Metabolic Panel  Recent Labs  12/16/13 1330 12/16/13 2149 12/17/13 0141  NA 132*  --  131*  K 4.8  --  4.1  CL 99  --  97  CO2 22  --  22  GLUCOSE 191*  --  166*  BUN 20  --  18  CREATININE 0.70  --  0.70  CALCIUM 9.2  --  8.6  MG  --  1.7  --     Thyroid Function Tests  Recent Labs  12/16/13 2149  TSH 2.370    TELE  atrial fibrillation   Radiology/Studies  Dg  Chest 2 View  12/16/2013   CLINICAL DATA:  Chest pain and shortness of breath.  EXAM: CHEST  2 VIEW  COMPARISON:  PA and lateral chest 04/18/2011.  FINDINGS: There is some peribronchial thickening. No consolidative process, pneumothorax or effusion. Heart size is normal.  IMPRESSION: Chronic bronchitic change.  No acute finding.    ASSESSMENT AND PLAN The patient is a 63 y/o female with a hx of DM, COPD, CAD s/p DES to dLCx in 2009 and HTN who presented to the [facility] ED 12/17/13 complaining of intermittent palpitations, fatigue and DOE x 2-3 months. EKG demonstrates atrial fibrillation w/ RVR.   Atrial Fibrillation w/ RVR: new onset x 3 weeks. -- She is currently rate controlled on IV Cardizem and feeling much better.  -- On IV heparin. She has a CHA2DS2-VASc score of at least 4 (DM, HTN, Age, female sex) placing her at high risk for thromboembolic events. She will likely require an oral anticoagulant. With normal renal function, she would be a candidate for a NOAC.  -- TSH and Mg normal -- 2 D ECHO pending -- Continue home atenolol 50mg   CAD: Last cath in 2012 revealed patent  distal circumflex posterolateral branch stent and stable PDA lesion. She had a low risk NST in November 2014. If 2D echo demonstrates normal LV function and no WMA, there is likely no need to repeat. Continue ASA, BB and statin and imdur,  HTN: Controlled. Continue amlodipine, ACEi   DM: Continue home meds and ACEi  HLD: continue statin   Signed, Perry Mount PA-C  Pager [phone]

## 2013-12-17 NOTE — Anesthesia Postprocedure Evaluation (Signed)
  Anesthesia Post-op Note  Patient: [name]  Procedure(s) Performed: Procedure(s) with comments: TRANSESOPHAGEAL ECHOCARDIOGRAM (TEE) (N/A) - trish/ja CARDIOVERSION (N/A)  Patient Location: Endoscopy Unit  Anesthesia Type:General  Level of Consciousness: awake, alert  and oriented  Airway and Oxygen Therapy: Patient Spontanous Breathing and Patient connected to nasal cannula oxygen  Post-op Pain: mild  Post-op Assessment: Post-op Vital signs reviewed, Patient's Cardiovascular Status Stable, Respiratory Function Stable, Patent Airway and Pain level controlled  Post-op Vital Signs: stable  Last Vitals:  Filed Vitals:   12/17/13 1405  BP: 114/28  Pulse:   Temp:   Resp: 17    Complications: No apparent anesthesia complications

## 2013-12-17 NOTE — Interval H&P Note (Signed)
History and Physical Interval Note:  12/17/2013 10:52 AM  [name]  has presented today for surgery, with the diagnosis of a-fib  The various methods of treatment have been discussed with the patient and family. After consideration of risks, benefits and other options for treatment, the patient has consented to  Procedure(s) with comments: TRANSESOPHAGEAL ECHOCARDIOGRAM (TEE) (N/A) - trish/ja CARDIOVERSION (N/A) as a surgical intervention .  The patient's history has been reviewed, patient examined, no change in status, stable for surgery.  I have reviewed the patient's chart and labs.  Questions were answered to the patient's satisfaction.    Pixie Casino, MD, [facility] Attending Cardiologist [place]

## 2013-12-17 NOTE — Progress Notes (Signed)
[place] for heparin Indication: atrial fibrillation  Allergies  Allergen Reactions  . Benadryl [Diphenhydramine Hcl] Other (See Comments)    Restless leg  . Clopidogrel Bisulfate     Nausea & pain  . Contrast Media [Iodinated Diagnostic Agents] Palpitations    Rapid heart rate, hot    Patient Measurements: Height: 5\' 2"  (157.5 cm) Weight: 206 lb 2.1 oz (93.5 kg) IBW/kg (Calculated) : 50.1 Heparin Dosing Weight: 71.2kg  Vital Signs: Temp: 97.5 F (36.4 C) (05/06 2102) Temp src: Oral (05/06 2102) BP: 122/55 mmHg (05/06 2102) Pulse Rate: 76 (05/06 2102)  Labs:  Recent Labs  12/16/13 1330 12/17/13 0141 12/17/13 0144  HGB 12.4 11.8*  --   HCT 35.5* 33.6*  --   PLT 275 267  --   HEPARINUNFRC  --   --  0.20*  CREATININE 0.70 0.70  --     Estimated Creatinine Clearance: 74.7 ml/min (by C-G formula based on Cr of 0.7).  Assessment: 63 yo female with Afib for heparin   Goal of Therapy:  Heparin level 0.3-0.7 units/ml Monitor platelets by anticoagulation protocol: Yes   Plan:  Heparin 2000 units IV bolus, then increase heparin 1200 units/hr Check heparin level in 6 hours.   Bronson Curb Zlata Alcaide 12/17/2013,4:25 AM

## 2013-12-17 NOTE — CV Procedure (Signed)
TEE/CARDIOVERSION NOTE  TRANSESOPHAGEAL ECHOCARDIOGRAM (TEE):  Indictation: Atrial Fibrillation  Consent:   Informed consent was obtained prior to the procedure. The risks, benefits and alternatives for the procedure were discussed and the patient comprehended these risks.  Risks include, but are not limited to, cough, sore throat, vomiting, nausea, somnolence, esophageal and stomach trauma or perforation, bleeding, low blood pressure, aspiration, pneumonia, infection, trauma to the teeth and death.    Time Out: Verified patient identification, verified procedure, site/side was marked, verified correct patient position, special equipment/implants available, medications/allergies/relevent history reviewed, required imaging and test results available. Performed  Procedure:  After a procedural time-out, the patient was sedated with propofol per anesthesia.  The oropharynx was anesthetized 10 cc of topical 1% viscous lidocaine and 1 cetacaine spray.  The transesophageal probe was inserted in the esophagus and stomach without difficulty and multiple views were obtained.  The patient was kept under observation until the patient left the procedure room.  The patient left the procedure room in stable condition.   Agitated microbubble saline contrast was administered.  Complications:    Complications: None Patient did tolerate procedure well.  Findings:  1. LEFT VENTRICLE: The left ventricular wall thickness is mildly increased.  The left ventricular cavity is normal in size. Wall motion is normal.  LVEF is 55-60%.  2. RIGHT VENTRICLE:  The right ventricle is normal in structure and function without any thrombus or masses.    3. LEFT ATRIUM:  The left atrium is dilated in size without any thrombus or masses.  There is not spontaneous echo contrast ("smoke") in the left atrium consistent with a low flow state.  4. LEFT ATRIAL APPENDAGE:  The left atrial appendage is free of any thrombus  or masses. The appendage has single lobes. Pulse doppler indicates moderate flow in the appendage.  5. ATRIAL SEPTUM:  The atrial septum is mobile, but not aneurysmal.  There is a very small, provokable PFO with right to left flow by saline microbubble after hepatic reflux pressure.  6. RIGHT ATRIUM:  The right atrium is normal in size and function without any thrombus or masses.  7. MITRAL VALVE:  The mitral valve is normal in structure and function with trace regurgitation.  There were no vegetations or stenosis.  8. AORTIC VALVE:  The aortic valve is normal in structure and function with no regurgitation.  There were no vegetations or stenosis  9. TRICUSPID VALVE:  The tricuspid valve is normal in structure and function with trivial regurgitation.  There were no vegetations or stenosis  10.  PULMONIC VALVE:  The pulmonic valve is normal in structure and function with no regurgitation.  There were no vegetations or stenosis.   11. AORTIC ARCH, ASCENDING AND DESCENDING AORTA:  There was grade 2 Ron Parker et. 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  CARDIOVERSION:     Second Time Out: Verified patient identification, verified procedure, site/side was marked, verified correct patient position, special equipment/implants available, medications/allergies/relevent history reviewed, required imaging and test results available.  Performed  Procedure:  1. Patient placed on cardiac monitor, pulse oximetry, supplemental oxygen as necessary.  2. Sedation administered per anesthesia 3. Pacer pads placed anterior and posterior chest. 4. Cardioverted 1 time(s).  5. Cardioverted at 150J biphasic.  Complications:  Complications: None Patient did tolerate procedure well.  Impression:  1. LVEF 55-60% 2. No LAA thrombus 3. Very small PFO with a few microbubbles crossing right to left after valsalva 4. 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 5. Successful DCCV to NSR after a single 150J biphasic shock.  Recommendations:  1. Continue current medications. 2. Switch to NOAC prior to d/c. 3. Outpatient follow-up with Dr. Gwenlyn Found.  Time Spent Directly with the Patient:  45 minutes   Pixie Casino, MD, [facility] Attending Cardiologist [facility]  12/17/2013, 1:36 PM

## 2013-12-17 NOTE — Transfer of Care (Signed)
Immediate Anesthesia Transfer of Care Note  Patient: [name]  Procedure(s) Performed: Procedure(s) with comments: TRANSESOPHAGEAL ECHOCARDIOGRAM (TEE) (N/A) - trish/ja CARDIOVERSION (N/A)  Patient Location: PACU and Endoscopy Unit  Anesthesia Type:MAC  Level of Consciousness: awake and alert   Airway & Oxygen Therapy: Patient Spontanous Breathing and Patient connected to nasal cannula oxygen  Post-op Assessment: Report given to PACU RN and Post -op Vital signs reviewed and stable  Post vital signs: Reviewed and stable  Complications: No apparent anesthesia complications

## 2013-12-17 NOTE — Care Management Note (Unsigned)
    Page 1 of 1   12/17/2013     3:47:03 PM CARE MANAGEMENT NOTE 12/17/2013  Patient:  [name], [name]   Account Number:  [id number]  Date Initiated:  12/17/2013  Documentation initiated by:  Mallissa Lorenzen  Subjective/Objective Assessment:   Pt adm on 12/16/13 with atrial fib with RVR.  PTA, pt independent, lives with partner.     Action/Plan:   Will follow for dc needs as pt progresses.   Anticipated DC Date:  12/19/2013   Anticipated DC Plan:  [place]  CM consult      Choice offered to / List presented to:             Status of service:  In process, will continue to follow Medicare Important Message given?   (If response is "NO", the following Medicare IM given date fields will be blank) Date Medicare IM given:   Date Additional Medicare IM given:    Discharge Disposition:    Per UR Regulation:  Reviewed for med. necessity/level of care/duration of stay  If discussed at [place] of Stay Meetings, dates discussed:    Comments:

## 2013-12-17 NOTE — Anesthesia Preprocedure Evaluation (Addendum)
Anesthesia Evaluation  Patient identified by MRN, date of birth, ID band Patient awake    Reviewed: Allergy & Precautions, H&P , NPO status , Patient's Chart, lab work & pertinent test results  Airway Mallampati: II TM Distance: >3 FB Neck ROM: Full    Dental  (+) Teeth Intact, Dental Advisory Given   Pulmonary former smoker,  breath sounds clear to auscultation        Cardiovascular hypertension, Pt. on medications Rhythm:Regular Rate:Normal     Neuro/Psych    GI/Hepatic GERD-  Controlled and Medicated,  Endo/Other  diabetes, Type 2, Oral Hypoglycemic Agents  Renal/GU      Musculoskeletal   Abdominal   Peds  Hematology   Anesthesia Other Findings   Reproductive/Obstetrics                          Anesthesia Physical Anesthesia Plan  ASA: III  Anesthesia Plan: General   Post-op Pain Management:    Induction: Intravenous  Airway Management Planned: Nasal Cannula  Additional Equipment: None  Intra-op Plan:   Post-operative Plan: Extubation in OR  Informed Consent: I have reviewed the patients History and Physical, chart, labs and discussed the procedure including the risks, benefits and alternatives for the proposed anesthesia with the patient or authorized representative who has indicated his/her understanding and acceptance.   Dental advisory given  Plan Discussed with: CRNA, Anesthesiologist and Surgeon  Anesthesia Plan Comments:         Anesthesia Quick Evaluation

## 2013-12-17 NOTE — Progress Notes (Signed)
Heparin gtt tubed to station 25 short stay per nurse request Rickard Rhymes, RN

## 2013-12-17 NOTE — Progress Notes (Signed)
  Echocardiogram Echocardiogram Transesophageal has been performed.  [name] Orlean Patten 12/17/2013, 1:34 PM

## 2013-12-18 ENCOUNTER — Encounter (HOSPITAL_COMMUNITY): Payer: Self-pay | Admitting: Internal Medicine

## 2013-12-18 LAB — CBC
HCT: 29.4 % — ABNORMAL LOW (ref 36.0–46.0)
Hemoglobin: 10.1 g/dL — ABNORMAL LOW (ref 12.0–15.0)
MCH: 29.6 pg (ref 26.0–34.0)
MCHC: 34.4 g/dL (ref 30.0–36.0)
MCV: 86.2 fL (ref 78.0–100.0)
Platelets: 209 10*3/uL (ref 150–400)
RBC: 3.41 MIL/uL — ABNORMAL LOW (ref 3.87–5.11)
RDW: 13.5 % (ref 11.5–15.5)
WBC: 7.7 10*3/uL (ref 4.0–10.5)

## 2013-12-18 LAB — GLUCOSE, CAPILLARY
Glucose-Capillary: 137 mg/dL — ABNORMAL HIGH (ref 70–99)
Glucose-Capillary: 158 mg/dL — ABNORMAL HIGH (ref 70–99)

## 2013-12-18 MED ORDER — RIVAROXABAN 20 MG PO TABS: 20.0000 mg | ORAL_TABLET | Freq: Every day | ORAL | Status: DC

## 2013-12-18 NOTE — Discharge Summary (Signed)
Discharge Summary   Patient ID: [name] MRN: [id number], DOB/AGE: 1951-06-15 63 y.o. Admit date: 12/16/2013 D/C date:     12/18/2013  Primary Cardiologist: Dr. Gwenlyn Found   Principal Problem:   Atrial fibrillation with RVR Active Problems:   Type II or unspecified type diabetes mellitus with peripheral circulatory disorders, uncontrolled(250.72)   HYPERLIPIDEMIA   HYPERTENSION   CAD (coronary artery disease), hx of LCX stent DES 2009,  last cath 2012 50-60% RCA stenosis, patent stent, 30 % LM.   Atrial fibrillation with rapid ventricular response   Admission Dates: 12/16/13-12/18/13 Discharge Diagnosis: New onset atrial fibrillation s/p successful TEE/DCCV. Now on Xarelto.   HPI: The patient is a 63 y/o female with a hx of DM, COPD, CAD s/p DES to dLCx in 2009 and HTN who presented to the [facility] ED 12/17/13 complaining of intermittent palpitations, fatigue and DOE x 2-3 months. EKG demonstrateed atrial fibrillation w/ RVR.  Hospital Course: The patient was placed on a diltiazem gtt which successfuly controlled her rate. She improved symptomatically, but remained in atrial fibrillation. It was suspected that she had been in atrial fibrillation for >48 hours so she underwent a TEE/DCCV on 12/17/13 which successfully converted her into NSR, which she has maintained. Her CHA2DS2-VASc score is 4 (DM, HTN, Age, female sex) placing her at high risk for thromboembolic events. She has normal kidney function; therefore, it was elected to place her on a NOAC. IV heparinwas d/c'd on 12/17/13 and she was placed on Xarelto 20 mg daily. She received one dose before discharge. Her DAPT was discontinued with the addition of a NOAC since she has had no issues >4 years after placement of a DES. Her TSH and Mag were normal. The TEE revealed normal LV function and no RWMAs. She was continued on her home dose of atenolol 50mg .  CAD: Last cath in 2012 revealed a patent distal circumflex posterolateral branch stent and stable  PDA lesion. She had a low risk NST in November 2014. Troponin POC was normal and her ECG had no ST or TW changes. It was felt that there was no need to pursue further ischemic work up. She was continued on BB and statin and imdur. Her ASA and Effient were discontinued.    The patient has had an uncomplicated hospital course and is recovering well. She has been seen by Dr. Tamala Julian today and deemed ready for discharge home. All follow-up appointments have been scheduled. Discharge medications are listed below. She has been taken off of ASA/Effient and placed on Xarelto. She has been instructed to discontinue Celebrex as well.    Discharge Vitals: Blood pressure 109/40, pulse 60, temperature 97.6 F (36.4 C), temperature source Oral, resp. rate 16, height 5\' 2"  (1.575 m), weight 206 lb 2.1 oz (93.5 kg), SpO2 98.00%.  Labs: Lab Results  Component Value Date   WBC 7.7 12/18/2013   HGB 10.1* 12/18/2013   HCT 29.4* 12/18/2013   MCV 86.2 12/18/2013   PLT 209 12/18/2013     Recent Labs Lab 12/17/13 0141  NA 131*  K 4.1  CL 97  CO2 22  BUN 18  CREATININE 0.70  CALCIUM 8.6  GLUCOSE 166*     Diagnostic Studies/Procedures   Dg Chest 2 View  12/16/2013   CLINICAL DATA:  Chest pain and shortness of breath.  EXAM: CHEST  2 VIEW  COMPARISON:  PA and lateral chest 04/18/2011.  FINDINGS: There is some peribronchial thickening. No consolidative process, pneumothorax or effusion. Heart  size is normal.  IMPRESSION: Chronic bronchitic change.  No acute finding.     TEE: 12/17/2013 ------------------------------------------------------------ LV EF: 55% - 60% ------------------------------------------------------------ Indications: Atrial fibrillation - 427.31. ------------------------------------------------------------ History: PMH: Coronary artery disease. Risk factors: Hypertension. Diabetes mellitus. Dyslipidemia. ------------------------------------------------------------ Study Conclusions - Left  ventricle: The cavity size was normal. There was mild concentric hypertrophy. Systolic function was normal. The estimated ejection fraction was in the range of 55% to 60%. Wall motion was normal; there were no regional wall motion abnormalities. - Aortic valve: No evidence of vegetation. - Aorta: The aorta was normal, not dilated, and non-diseased. - Mitral valve: No evidence of vegetation. Trivial regurgitation. - Left atrium: No evidence of thrombus in the atrial cavity or appendage. - Right atrium: No evidence of thrombus in the atrial cavity or appendage. - Atrial septum: There was a patent foramen ovale. - Tricuspid valve: No evidence of vegetation. Trivial regurgitation. - Pulmonic valve: No evidence of vegetation.   Discharge Medications     Medication List    STOP taking these medications       aspirin 325 MG tablet     CELEBREX 200 MG capsule  Generic drug:  celecoxib     OVER THE COUNTER MEDICATION     prasugrel 10 MG Tabs tablet  Commonly known as:  EFFIENT      TAKE these medications       amLODipine 10 MG tablet  Commonly known as:  NORVASC  Take 10 mg by mouth daily.     ascorbic acid 1000 MG tablet  Commonly known as:  VITAMIN C  Take 1,000 mg by mouth 2 (two) times daily.     atenolol 50 MG tablet  Commonly known as:  TENORMIN  Take 50 mg by mouth daily.     bimatoprost 0.01 % Soln  Commonly known as:  LUMIGAN  Place 1 drop into both eyes at bedtime.     budesonide-formoterol 160-4.5 MCG/ACT inhaler  Commonly known as:  SYMBICORT  Inhale 2 puffs into the lungs 2 (two) times daily.     CALCIUM-MAG-VIT C-VIT D PO  Take 2 tablets by mouth daily.     Canagliflozin 100 MG Tabs  Commonly known as:  INVOKANA  Take 1 tablet (100 mg total) by mouth daily.     clotrimazole-betamethasone cream  Commonly known as:  LOTRISONE  Apply 1 application topically 2 (two) times daily.     estradiol 0.5 MG tablet  Commonly known as:  ESTRACE  Take  0.5 mg by mouth daily.     guaiFENesin 600 MG 12 hr tablet  Commonly known as:  MUCINEX  Take 600 mg by mouth 2 (two) times daily.     isosorbide mononitrate 30 MG 24 hr tablet  Commonly known as:  IMDUR  Take 1 tablet (30 mg total) by mouth daily.     levalbuterol 0.63 MG/3ML nebulizer solution  Commonly known as:  XOPENEX  Take 3 mLs (0.63 mg total) by nebulization every 6 (six) hours as needed for wheezing or shortness of breath.     levalbuterol 45 MCG/ACT inhaler  Commonly known as:  XOPENEX HFA  Inhale 1-2 puffs into the lungs every 6 (six) hours as needed for wheezing.     LORazepam 0.5 MG tablet  Commonly known as:  ATIVAN  Take 1 mg by mouth at bedtime.     Melatonin 5 MG Tabs  Take 1 tablet by mouth at bedtime.     multivitamins with iron Tabs tablet  Take 1 tablet by mouth 2 (two) times daily.     mupirocin ointment 2 %  Commonly known as:  BACTROBAN  Apply 1 application topically daily.     omega-3 acid ethyl esters 1 G capsule  Commonly known as:  LOVAZA  Take 1 g by mouth daily.     omeprazole 20 MG capsule  Commonly known as:  PRILOSEC  Take 20 mg by mouth daily.     ONETOUCH DELICA LANCETS FINE Misc  1 Units by Does not apply route as directed.     PARoxetine 10 MG tablet  Commonly known as:  PAXIL  Take 25 mg by mouth daily.     quinapril 40 MG tablet  Commonly known as:  ACCUPRIL  Take 40 mg by mouth daily.     rivaroxaban 20 MG Tabs tablet  Commonly known as:  XARELTO  Take 1 tablet (20 mg total) by mouth daily with supper.     rosuvastatin 10 MG tablet  Commonly known as:  CRESTOR  Take 1 tablet (10 mg total) by mouth at bedtime.     senna 8.6 MG Tabs tablet  Commonly known as:  SENOKOT  Take 1 tablet by mouth daily.     sitaGLIPtin-metformin 50-1000 MG per tablet  Commonly known as:  JANUMET  Take 0.5-1 tablets by mouth 2 (two) times daily with a meal. Take 0.5 tablet (500mg ) with morning meal daily & take 1 tablet (1,000mg ) with  evening meal daily.        Disposition   The patient will be discharged in stable condition to home.  Follow-up Information   Follow up with Lorretta Harp, MD. (The office will call to make an appointment with you for 1 week post hospital visit. If you do not hear from them by Monday, please call them. Okay to see a NP/PA)    Specialty:  Cardiology   Contact information:   [address] [place] [address] [phone]         Duration of Discharge Encounter: Greater than 30 minutes including physician and PA time.  Signed, Perry Mount PA-C 12/18/2013, 12:29 PM

## 2013-12-18 NOTE — Progress Notes (Addendum)
The patient had successful TEE cardioversion and overnight no recurrence of AF. Paln discharge today with f/u in 1 week and chronic anticoagulation with Xarelto.

## 2013-12-18 NOTE — Progress Notes (Signed)
Pt/family given discharge instructions, medication lists, follow up appointments, and when to call the doctor.  Pt/family verbalizes understanding. Payton Emerald, RN

## 2013-12-18 NOTE — Discharge Summary (Signed)
The patient had successful TEE cardioversion. She has maintained sinus rhythm overnight. No complications occurred and she has been successfully placed on a novel oral anticoagulant, Xarelto. Will withdraw antiplatelet therapy. We'll discharge the patient home today.

## 2013-12-18 NOTE — Discharge Instructions (Signed)

## 2013-12-18 NOTE — Progress Notes (Signed)
Patient Name: [name] Date of Encounter: 12/18/2013     Principal Problem:   Atrial fibrillation with RVR Active Problems:   Type II or unspecified type diabetes mellitus with peripheral circulatory disorders, uncontrolled(250.72)   HYPERLIPIDEMIA   HYPERTENSION   CAD (coronary artery disease), hx of LCX stent DES 2009,  last cath 2012 50-60% RCA stenosis, patent stent, 30 % LM.   Atrial fibrillation with rapid ventricular response    SUBJECTIVE  feeling well. Walked a lot yesterday - "over did it". She did have one episode of CP yesterday while walking. This resolved with rest in about 15 minutes. Associated with some SOB. No other associated sx.   CURRENT MEDS . amLODipine  10 mg Oral Daily  . aspirin EC  81 mg Oral Daily  . atenolol  50 mg Oral Daily  . atorvastatin  20 mg Oral q1800  . budesonide-formoterol  2 puff Inhalation BID  . Canagliflozin  100 mg Oral Daily  . celecoxib  200 mg Oral BID  . estradiol  0.5 mg Oral Daily  . isosorbide mononitrate  30 mg Oral Daily  . metFORMIN  500 mg Oral Q breakfast   And  . linagliptin  2.5 mg Oral Q breakfast  . linagliptin  5 mg Oral Q supper   And  . metFORMIN  1,000 mg Oral Q supper  . lisinopril  40 mg Oral Daily  . omega-3 acid ethyl esters  1 g Oral Daily  . pantoprazole  40 mg Oral Daily  . PARoxetine  25 mg Oral Daily  . rivaroxaban  20 mg Oral Q supper  . sodium chloride  3 mL Intravenous Q12H    OBJECTIVE  Filed Vitals:   12/17/13 1400 12/17/13 1405 12/17/13 2136 12/18/13 0627  BP: 104/31 114/28 104/50 109/40  Pulse: 51  59 60  Temp:   98 F (36.7 C) 97.6 F (36.4 C)  TempSrc:   Oral Oral  Resp: 13 17 16 16   Height:      Weight:      SpO2: 100%  99% 98%    Intake/Output Summary (Last 24 hours) at 12/18/13 1001 Last data filed at 12/18/13 0811  Gross per 24 hour  Intake   1050 ml  Output    400 ml  Net    650 ml   Filed Weights   12/16/13 2102  Weight: 206 lb 2.1 oz (93.5 kg)     PHYSICAL EXAM  General: Pleasant, NAD. obese Neuro: Alert and oriented X 3. Moves all extremities spontaneously. Psych: Normal affect. HEENT:  Normal  Neck: Supple without bruits or JVD. Lungs:  Resp regular and unlabored, CTA. Heart: RRR no s3, s4, or murmurs. Abdomen: Soft, non-tender, non-distended, BS + x 4.  Extremities: No clubbing, cyanosis or edema. DP/PT/Radials 2+ and equal bilaterally.  Accessory Clinical Findings  CBC  Recent Labs  12/17/13 0141 12/18/13 0500  WBC 7.6 7.7  HGB 11.8* 10.1*  HCT 33.6* 29.4*  MCV 84.8 86.2  PLT 267 932   Basic Metabolic Panel  Recent Labs  12/16/13 1330 12/16/13 2149 12/17/13 0141  NA 132*  --  131*  K 4.8  --  4.1  CL 99  --  97  CO2 22  --  22  GLUCOSE 191*  --  166*  BUN 20  --  18  CREATININE 0.70  --  0.70  CALCIUM 9.2  --  8.6  MG  --  1.7  --  Thyroid Function Tests  Recent Labs  12/16/13 2149  TSH 2.370    TELE  NSR since DCCV at 1300 yesterday  Radiology/Studies  Dg Chest 2 View  12/16/2013   CLINICAL DATA:  Chest pain and shortness of breath.  EXAM: CHEST  2 VIEW  COMPARISON:  PA and lateral chest 04/18/2011.  FINDINGS: There is some peribronchial thickening. No consolidative process, pneumothorax or effusion. Heart size is normal.  IMPRESSION: Chronic bronchitic change.  No acute finding.    TEE: 12/17/2013 ------------------------------------------------------------ LV EF: 55% - 60% ------------------------------------------------------------ Indications: Atrial fibrillation - 427.31. ------------------------------------------------------------ History: PMH: Coronary artery disease. Risk factors: Hypertension. Diabetes mellitus. Dyslipidemia. ------------------------------------------------------------ Study Conclusions - Left ventricle: The cavity size was normal. There was mild concentric hypertrophy. Systolic function was normal. The estimated ejection fraction was in the range  of 55% to 60%. Wall motion was normal; there were no regional wall motion abnormalities. - Aortic valve: No evidence of vegetation. - Aorta: The aorta was normal, not dilated, and non-diseased. - Mitral valve: No evidence of vegetation. Trivial regurgitation. - Left atrium: No evidence of thrombus in the atrial cavity or appendage. - Right atrium: No evidence of thrombus in the atrial cavity or appendage. - Atrial septum: There was a patent foramen ovale. - Tricuspid valve: No evidence of vegetation. Trivial regurgitation. - Pulmonic valve: No evidence of vegetation.    ASSESSMENT AND PLAN The patient is a 63 y/o female with a hx of DM, COPD, CAD s/p DES to dLCx in 2009 and HTN who presented to the [facility] ED 12/17/13 complaining of intermittent palpitations, fatigue and DOE x 2-3 months. EKG demonstrateed atrial fibrillation w/ RVR.   Atrial Fibrillation w/ RVR: new onset x 3 weeks. -- She is currently in NSR s/p TEE/DCCV yesterday.  -- She has a CHA2DS2-VASc score of at least 4 (DM, HTN, Age, female sex) placing her at high risk for thromboembolic events.  --  IV heparin d/c'd yesterday and she was placed on Xarelto 20 mg daily. She has received one dose of this yesterday. DAPT discontinued with addition of NOAC.  -- TSH and Mg normal -- TEE with normal LV function and no RWMA -- Continue home atenolol 50mg   CAD: Last cath in 2012 revealed patent distal circumflex posterolateral branch stent and stable PDA lesion. She had a low risk NST in November 2014. If 2D echo demonstrates normal LV function and no WMA, there is likely no need to repeat. Continue ASA, BB and statin and imdur,  HTN: Controlled. Continue amlodipine, ACEi   DM: Continue home meds and ACEi  HLD: continue statin   Signed, Perry Mount PA-C  Pager [phone]

## 2013-12-25 ENCOUNTER — Other Ambulatory Visit: Payer: Self-pay | Admitting: Internal Medicine

## 2013-12-31 ENCOUNTER — Encounter: Payer: Self-pay | Admitting: Internal Medicine

## 2014-01-08 ENCOUNTER — Encounter: Payer: Self-pay | Admitting: Cardiovascular Disease

## 2014-01-08 ENCOUNTER — Ambulatory Visit (INDEPENDENT_AMBULATORY_CARE_PROVIDER_SITE_OTHER): Payer: Medicare Other | Admitting: Cardiovascular Disease

## 2014-01-08 VITALS — BP 144/50 | HR 61 | Ht 62.0 in | Wt 203.4 lb

## 2014-01-08 DIAGNOSIS — I4891 Unspecified atrial fibrillation: Secondary | ICD-10-CM

## 2014-01-08 DIAGNOSIS — E785 Hyperlipidemia, unspecified: Secondary | ICD-10-CM

## 2014-01-08 DIAGNOSIS — I2510 Atherosclerotic heart disease of native coronary artery without angina pectoris: Secondary | ICD-10-CM

## 2014-01-08 DIAGNOSIS — I208 Other forms of angina pectoris: Secondary | ICD-10-CM

## 2014-01-08 DIAGNOSIS — I10 Essential (primary) hypertension: Secondary | ICD-10-CM

## 2014-01-08 NOTE — Assessment & Plan Note (Signed)
History of CAD status post stenting of her distal circumflex posterior lateral branch 05/26/08. She had repeat catheterization performed 04/20/11 revealing a patent stent with 50% distal PDA stenosis and normal LV function. Medical therapy was recommended. A subsequent Myoview stress test was entirely normal performed October 2014 for chest pain.

## 2014-01-08 NOTE — Assessment & Plan Note (Signed)
Patient was admitted to the hospital with A. Fib with RVR earlier this month. She was anticoagulated and rate controlled and an ultimate underwent transesophageal guided DC cardioversion by Dr. [name] on 12/17/13. She had normal LV function with a small provokable PFO . She was successfully cardioverted with one shock and placed on Xarelto  oral anticoagulation. She feels markedly improved clinically.

## 2014-01-08 NOTE — Patient Instructions (Addendum)
  Your physician recommends that you schedule a follow-up appointment in: 6 Months with Mickel Baas, 12 Months with Dr Gwenlyn Found'

## 2014-01-08 NOTE — Progress Notes (Signed)
01/08/2014 [name]   04/09/1951  [id number]  Primary Physician Unice Cobble, MD Primary Cardiologist: Lorretta Harp MD [name]   HPI:  The patient is a very pleasant 63 year old moderately overweight divorced Caucasian female, mother of 2 adopted children, ages 27 and 72, who is formerly a patient of Dr. Ky Barban. I saw her 1 year ago. 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 Her risk factors include former tobacco abuse, having quit 18 years ago, diabetes, hypertension, hyperlipidemia, and a positive family history for heart disease. She has adult-onset asthma as well. Dr. Melvern Banker stented her distal circumflex posterolateral branch, May 26, 2008, with some residual disease in the PDA branch, and normal LV function. Myoview performed May 06, 2008, was not ischemic. I saw her summer of last year, at which time she was complaining of chest pain, for which I prescribed Imdur, which she never filled. I suggested a stress test, which was never obtained. Because of her recurrent chest tightness and increasing dyspnea with radiation to her left neck, I performed cardiac catheterization on her, April 20, 2011, revealing a patent stent, 50% distal PDA lesion, with normal LV function. I thought her disease was stable and had not progressed, and recommended medical therapy. She has had no recurrent chest pain since I last saw her. She has some mild dyspnea. Recent lab work /2/13 revealed a total cholesterol of 129, LDL of 63, and HDL of 45.  She saw Cecilie [name] in the office 05/13/13 with chest pain. A subsequent Myoview stress test was entirely normal. She was admitted to the hospital earlier this month with a good RPR which by history had been on for several weeks but prior. She was symptomatic. She'll underwent transesophageal guided DC cardioversion by Dr. [name]  successfully with one shock and was placed on 0 out to oral anticoagulation. She  felt markedly improved clinically. She remains in sinus rhythm today.    Current Outpatient Prescriptions  Medication Sig Dispense Refill  . amLODipine (NORVASC) 10 MG tablet Take 10 mg by mouth daily.      [name] ascorbic acid (VITAMIN C) 1000 MG tablet Take 1,000 mg by mouth 2 (two) times daily.       [name] atenolol (TENORMIN) 50 MG tablet Take 50 mg by mouth daily.      . budesonide-formoterol (SYMBICORT) 160-4.5 MCG/ACT inhaler Inhale 2 puffs into the lungs 2 (two) times daily.  1 Inhaler  3  . CALCIUM-MAG-VIT C-VIT D PO Take 2 tablets by mouth daily.       . Canagliflozin (INVOKANA) 100 MG TABS Take 1 tablet (100 mg total) by mouth daily.  30 tablet  3  . celecoxib (CELEBREX) 200 MG capsule Take 1 capsule by mouth 2 (two) times daily.      . clotrimazole-betamethasone (LOTRISONE) cream Apply 1 application topically 2 (two) times daily.  45 g  0  . estradiol (ESTRACE) 0.5 MG tablet Take 0.5 mg by mouth daily.       [name] guaiFENesin (MUCINEX) 600 MG 12 hr tablet Take 600 mg by mouth 2 (two) times daily.      [name] JANUMET 50-1000 MG per tablet TAKE 1/2 TABLET IN THE MORNING WITH BREAKFAST AND 1 TABLET IN THE EVENING WITH SUPPER.  45 tablet  3  . latanoprost (XALATAN) 0.005 % ophthalmic solution Place 1 drop into both eyes at bedtime.      . levalbuterol (XOPENEX HFA) 45 MCG/ACT inhaler Inhale 1-2  puffs into the lungs every 6 (six) hours as needed for wheezing.  1 Inhaler  1  . levalbuterol (XOPENEX) 0.63 MG/3ML nebulizer solution Take 3 mLs (0.63 mg total) by nebulization every 6 (six) hours as needed for wheezing or shortness of breath.  75 mL  1  . LORazepam (ATIVAN) 0.5 MG tablet Take 1 mg by mouth at bedtime.       . Melatonin 5 MG TABS Take 1 tablet by mouth at bedtime.       . Multiple Vitamins-Iron (MULTIVITAMINS WITH IRON) TABS Take 1 tablet by mouth 2 (two) times daily.       . mupirocin ointment (BACTROBAN) 2 % Apply 1 application topically daily.      [name] omega-3 acid ethyl esters (LOVAZA) 1 G  capsule Take 1 g by mouth daily.       [name] omeprazole (PRILOSEC) 20 MG capsule Take 20 mg by mouth daily.       Glory Rosebush DELICA LANCETS FINE MISC 1 Units by Does not apply route as directed.  100 each  3  . PARoxetine (PAXIL) 10 MG tablet Take 25 mg by mouth daily.       . quinapril (ACCUPRIL) 40 MG tablet Take 40 mg by mouth daily.      . rivaroxaban (XARELTO) 20 MG TABS tablet Take 1 tablet (20 mg total) by mouth daily with supper.  30 tablet  11  . rosuvastatin (CRESTOR) 10 MG tablet Take 1 tablet (10 mg total) by mouth at bedtime.  30 tablet  5  . senna (SENOKOT) 8.6 MG TABS Take 1 tablet by mouth daily.       . bimatoprost (LUMIGAN) 0.01 % SOLN Place 1 drop into both eyes at bedtime.       . isosorbide mononitrate (IMDUR) 30 MG 24 hr tablet Take 1 tablet (30 mg total) by mouth daily.  30 tablet  6   No current facility-administered medications for this visit.    Allergies  Allergen Reactions  . Benadryl [Diphenhydramine Hcl] Other (See Comments)    Restless leg  . Clopidogrel Bisulfate     Nausea & pain  . Contrast Media [Iodinated Diagnostic Agents] Palpitations    Rapid heart rate, hot    History   Social History  . Marital Status: Married    Spouse Name: N/A    Number of Children: 2  . Years of Education: master's   Occupational History  . Speech Pathologist    Social History Main Topics  . Smoking status: Former Smoker -- 1.50 packs/day for 30 years    Types: Cigarettes    Quit date: 08/13/1993  . Smokeless tobacco: Never Used  . Alcohol Use: No  . Drug Use: No  . Sexual Activity: Not on file   Other Topics Concern  . Not on file   Social History Narrative  . No narrative on file     Review of Systems: General: negative for chills, fever, night sweats or weight changes.  Cardiovascular: negative for chest pain, dyspnea on exertion, edema, orthopnea, palpitations, paroxysmal nocturnal dyspnea or shortness of breath Dermatological: negative for  rash Respiratory: negative for cough or wheezing Urologic: negative for hematuria Abdominal: negative for nausea, vomiting, diarrhea, bright red blood per rectum, melena, or hematemesis Neurologic: negative for visual changes, syncope, or dizziness All other systems reviewed and are otherwise negative except as noted above.    Blood pressure 144/50, pulse 61, height 5\' 2"  (1.575 m), weight 203 lb 6.4  oz (92.262 kg).  General appearance: alert and no distress Neck: no adenopathy, no carotid bruit, no JVD, supple, symmetrical, trachea midline and thyroid not enlarged, symmetric, no tenderness/mass/nodules Lungs: clear to auscultation bilaterally Heart: normal apical impulse Extremities: extremities normal, atraumatic, no cyanosis or edema  EKG normal sinus rhythm at 61 without ST or T wave changes  ASSESSMENT AND PLAN:   HYPERTENSION Controlled on current medications  HYPERLIPIDEMIA On statin therapy with recent lipid profile performed 07/03/13 revealed a total cholesterol of 143, LDL of 76 and HDL of 45  CAD (coronary artery disease), hx of LCX stent DES 2009,  last cath 2012 50-60% RCA stenosis, patent stent, 30 % LM. History of CAD status post stenting of her distal circumflex posterior lateral branch 05/26/08. She had repeat catheterization performed 04/20/11 revealing a patent stent with 50% distal PDA stenosis and normal LV function. Medical therapy was recommended. A subsequent Myoview stress test was entirely normal performed October 2014 for chest pain.  Atrial fibrillation with RVR Patient was admitted to the hospital with A. Fib with RVR earlier this month. She was anticoagulated and rate controlled and an ultimate underwent transesophageal guided DC cardioversion by Dr. [name] on 12/17/13. She had normal LV function with a small provokable PFO . She was successfully cardioverted with one shock and placed on Xarelto  oral anticoagulation. She feels markedly improved  clinically.      Lorretta Harp MD FACP,FACC,FAHA, [facility] 01/08/2014 12:01 PM

## 2014-01-08 NOTE — Assessment & Plan Note (Signed)
On statin therapy with recent lipid profile performed 07/03/13 revealed a total cholesterol of 143, LDL of 76 and HDL of 45

## 2014-01-08 NOTE — Assessment & Plan Note (Signed)
Controlled on current medications 

## 2014-01-12 ENCOUNTER — Other Ambulatory Visit: Payer: Self-pay | Admitting: Cardiovascular Disease

## 2014-01-12 NOTE — Telephone Encounter (Signed)
Rx was sent to pharmacy electronically. 

## 2014-02-18 ENCOUNTER — Other Ambulatory Visit: Payer: Self-pay | Admitting: Internal Medicine

## 2014-02-18 ENCOUNTER — Other Ambulatory Visit: Payer: Self-pay | Admitting: Cardiovascular Disease

## 2014-03-17 ENCOUNTER — Telehealth: Payer: Self-pay

## 2014-03-17 NOTE — Telephone Encounter (Signed)
Clld pt - LMOVM of cell to come in or check Dr. Clayborn Heron schedule for her diabetes mgmt follow up.

## 2014-03-18 ENCOUNTER — Other Ambulatory Visit: Payer: Self-pay | Admitting: Internal Medicine

## 2014-04-01 ENCOUNTER — Ambulatory Visit (INDEPENDENT_AMBULATORY_CARE_PROVIDER_SITE_OTHER): Payer: Medicare Other | Admitting: Internal Medicine

## 2014-04-01 ENCOUNTER — Other Ambulatory Visit (INDEPENDENT_AMBULATORY_CARE_PROVIDER_SITE_OTHER): Payer: Medicare Other

## 2014-04-01 ENCOUNTER — Encounter: Payer: Self-pay | Admitting: Internal Medicine

## 2014-04-01 VITALS — BP 142/60 | HR 65 | Wt 196.5 lb

## 2014-04-01 DIAGNOSIS — D649 Anemia, unspecified: Secondary | ICD-10-CM

## 2014-04-01 DIAGNOSIS — I10 Essential (primary) hypertension: Secondary | ICD-10-CM

## 2014-04-01 DIAGNOSIS — E785 Hyperlipidemia, unspecified: Secondary | ICD-10-CM

## 2014-04-01 DIAGNOSIS — E1159 Type 2 diabetes mellitus with other circulatory complications: Secondary | ICD-10-CM

## 2014-04-01 LAB — CBC WITH DIFFERENTIAL/PLATELET
Basophils Absolute: 0 10*3/uL (ref 0.0–0.1)
Basophils Relative: 0.5 % (ref 0.0–3.0)
Eosinophils Absolute: 0.2 10*3/uL (ref 0.0–0.7)
Eosinophils Relative: 2.2 % (ref 0.0–5.0)
HCT: 35.2 % — ABNORMAL LOW (ref 36.0–46.0)
Hemoglobin: 11.9 g/dL — ABNORMAL LOW (ref 12.0–15.0)
Lymphocytes Relative: 14.3 % (ref 12.0–46.0)
Lymphs Abs: 1.1 10*3/uL (ref 0.7–4.0)
MCHC: 33.8 g/dL (ref 30.0–36.0)
MCV: 85.7 fl (ref 78.0–100.0)
Monocytes Absolute: 0.5 10*3/uL (ref 0.1–1.0)
Monocytes Relative: 6.8 % (ref 3.0–12.0)
Neutro Abs: 6 10*3/uL (ref 1.4–7.7)
Neutrophils Relative %: 76.2 % (ref 43.0–77.0)
Platelets: 292 10*3/uL (ref 150.0–400.0)
RBC: 4.11 Mil/uL (ref 3.87–5.11)
RDW: 12.6 % (ref 11.5–15.5)
WBC: 7.9 10*3/uL (ref 4.0–10.5)

## 2014-04-01 LAB — LIPID PANEL
Cholesterol: 135 mg/dL (ref 0–200)
HDL: 49.7 mg/dL (ref >39.00)
LDL Cholesterol: 61 mg/dL (ref 0–99)
NonHDL: 85.3
Total CHOL/HDL Ratio: 3
Triglycerides: 122 mg/dL (ref 0.0–149.0)
VLDL: 24.4 mg/dL (ref 0.0–40.0)

## 2014-04-01 LAB — HEPATIC FUNCTION PANEL
ALT: 28 U/L (ref 0–35)
AST: 44 U/L — ABNORMAL HIGH (ref 0–37)
Albumin: 3.8 g/dL (ref 3.5–5.2)
Alkaline Phosphatase: 51 U/L (ref 39–117)
Bilirubin, Direct: 0 mg/dL (ref 0.0–0.3)
Total Bilirubin: 0.5 mg/dL (ref 0.2–1.2)
Total Protein: 7.1 g/dL (ref 6.0–8.3)

## 2014-04-01 LAB — BASIC METABOLIC PANEL
BUN: 12 mg/dL (ref 6–23)
CO2: 29 mEq/L (ref 19–32)
Calcium: 9.1 mg/dL (ref 8.4–10.5)
Chloride: 97 mEq/L (ref 96–112)
Creatinine, Ser: 0.6 mg/dL (ref 0.4–1.2)
GFR: 102.54 mL/min (ref >60.00)
Glucose, Bld: 157 mg/dL — ABNORMAL HIGH (ref 70–99)
Potassium: 4.8 mEq/L (ref 3.5–5.1)
Sodium: 132 mEq/L — ABNORMAL LOW (ref 135–145)

## 2014-04-01 LAB — IBC PANEL
Iron: 37 ug/dL — ABNORMAL LOW (ref 42–145)
Saturation Ratios: 6.8 % — ABNORMAL LOW (ref 20.0–50.0)
Transferrin: 389 mg/dL — ABNORMAL HIGH (ref 212.0–360.0)

## 2014-04-01 LAB — MICROALBUMIN / CREATININE URINE RATIO
Creatinine,U: 26.4 mg/dL
Microalb Creat Ratio: 52.4 mg/g — ABNORMAL HIGH (ref 0.0–30.0)
Microalb, Ur: 13.8 mg/dL — ABNORMAL HIGH (ref 0.0–1.9)

## 2014-04-01 LAB — HEMOGLOBIN A1C: Hgb A1c MFr Bld: 7.1 % — ABNORMAL HIGH (ref 4.6–6.5)

## 2014-04-01 LAB — VITAMIN B12: Vitamin B-12: 670 pg/mL (ref 211–911)

## 2014-04-01 NOTE — Assessment & Plan Note (Signed)
Lipids, LFTs, TSH  

## 2014-04-01 NOTE — Progress Notes (Signed)
   Subjective:    Patient ID: [name], female    DOB: Mar 15, 1951, 63 y.o.   MRN: [id number]  HPI  She is here for followup of her diabetes, hypertension, and dyslipidemia. She has not monitored glucoses for at least 2 months. She has lost to below 200 pounds for the first time through small portions Ophthalmologic exam is pending;no retinopathy present to date. 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 She's had no cardiac symptoms since the atrial fib was resolved. She is on Xarelto.  On 12/18/2013 hemoglobin was 10.1/ hematocrit 29.4. She denies any bleeding dyscrasias  She also denies any active symptoms of diabetes.  She has been compliant with her statin and denies any adverse effects.    Review of Systems Epistaxis, hemoptysis, hematuria, melena, or rectal bleeding denied. No unexplained weight loss, significant dyspepsia,dysphagia, or abdominal pain.  There is no abnormal bruising , bleeding, or difficulty stopping bleeding with injury. Polyuria, polyphagia, polydipsia absent. There is no blurred vision, double vision, or loss of vision.  Also denied are numbness, tingling, or burning of the extremities. No nonhealing skin lesions present.      Objective:   Physical Exam  Pertinent positive physical findings include: Grade 1 soft systolic murmur at the base Abdomen is protuberant Crepitus of the knees, greater on the left than the right. The great toenails are absent.  General appearance :adequately nourished; in no distress. Eyes: No conjunctival inflammation or scleral icterus is present. Oral exam: Dental hygiene is good. Lips and gums are healthy appearing.There is no oropharyngeal erythema or exudate noted.  Heart:  Normal rate and regular rhythm. S1 and S2 normal without gallop, click, rub or other extra sounds   Lungs:Chest clear to auscultation; no wheezes, rhonchi,rales ,or rubs present.No  increased work of breathing.  Abdomen: bowel sounds normal, soft and non-tender without masses, organomegaly or hernias noted.  No guarding or rebound.  Skin:Warm & dry.  Intact without suspicious lesions or rashes ; no jaundice or tenting Lymphatic: No lymphadenopathy is noted about the head, neck, axilla           Assessment & Plan:  See Current Assessment & Plan in Problem List under specific Diagnosis

## 2014-04-01 NOTE — Progress Notes (Signed)
Pre visit review using our clinic review tool, if applicable. No additional management support is needed unless otherwise documented below in the visit note. 

## 2014-04-01 NOTE — Assessment & Plan Note (Signed)
A1c , urine microalbumin, BMET 

## 2014-04-01 NOTE — Assessment & Plan Note (Signed)
CBC B12 IBC

## 2014-04-01 NOTE — Patient Instructions (Signed)
Your next office appointment will be determined based upon review of your pending labs. Those instructions will be transmitted to you through My Chart . 

## 2014-04-05 ENCOUNTER — Telehealth: Payer: Self-pay

## 2014-04-05 NOTE — Telephone Encounter (Signed)
04/01/14 Office note has been faxed to Dr Toll Brothers

## 2014-04-05 NOTE — Telephone Encounter (Signed)
Message copied by Shelly Coss on Mon Apr 05, 2014  9:17 AM ------      Message from: Hendricks Limes      Created: Sun Apr 04, 2014  5:55 PM       Please FAX office visit & lab results to Dr Cristina Gong       ------

## 2014-04-06 ENCOUNTER — Telehealth: Payer: Self-pay

## 2014-04-06 NOTE — Telephone Encounter (Signed)
Phone call back to [facility] to let them know reason patient is being referred

## 2014-04-06 NOTE — Telephone Encounter (Signed)
Phone call from [facility] office needing verification as to why patient is being referred to see Dr Cristina Gong

## 2014-04-06 NOTE — Telephone Encounter (Signed)
Iron deficiency anemia in post menopausal female

## 2014-04-08 ENCOUNTER — Encounter: Payer: Self-pay | Admitting: Internal Medicine

## 2014-04-08 ENCOUNTER — Ambulatory Visit (INDEPENDENT_AMBULATORY_CARE_PROVIDER_SITE_OTHER): Payer: Medicare Other | Admitting: Internal Medicine

## 2014-04-08 VITALS — BP 130/80 | HR 109 | Wt 195.2 lb

## 2014-04-08 DIAGNOSIS — R51 Headache: Secondary | ICD-10-CM

## 2014-04-08 DIAGNOSIS — I499 Cardiac arrhythmia, unspecified: Secondary | ICD-10-CM

## 2014-04-08 MED ORDER — TRAMADOL HCL 50 MG PO TABS: 50.0000 mg | ORAL_TABLET | Freq: Four times a day (QID) | ORAL | Status: DC | PRN

## 2014-04-08 NOTE — Progress Notes (Signed)
   Subjective:    Patient ID: [name], female    DOB: May 01, 1951, 63 y.o.   MRN: [id number]  HPI   [name] is presenting with headaches for the last two weeks, increasing in frequency to daily for the last five days.  Headache is described as throbbing and constant and persisting for hours-days until abortive medication is taken. The headache radiates to the left temple, occiput and neck. It can be behind the eye. She has tried Tylenol extra strength with no effect.  Hydrocodone provides relief.  Pain begins suddenly & is associated with light sensitivity, nausea without vomiting.  She experiences slight dizziness described as being "not as stable" when walking.  Denies hearing changes, vision changes, weakness, facial pain, f/c/s, abnormal bruising.  She uses a computer daily, and describes holding her telephone with her head flexed toward her shoulder. She questions whether glare from the computer and this head positioning may be playing a role. Additionally she has been under stress as she is moving.  She denies the intake of excess nonsteroidals or caffeine. She will have a total of 1 cup of caffeinated coffee daily.  History includes glaucoma and cluster headaches when she was in her 24s.  Maternal history of migranes.  Review of Systems  Pertinent ROS noted in HPI.  She denies associated blurred vision, double vision, loss of vision.  There is no excessive tearing of eyes or rhinitis.  She denies any associated frank vertigo  She also has no numbness, tingling, or weakness in her extremities.  The headache is not associated with nasal purulence, fever, chills or sweats  There's also no associated rash or change in color or temperature of skin  She has no abnormal bruising or bleeding.  She has had some nausea.       Objective:   Physical Exam Significant or distinguishing  findings on physical exam include: Romberg, heel to toe, finger to nose, RAM, field of  vision, EOM normal.  Mild photophobia with fundoscopic exam. Cardiac: irregular , rapid rhythm.  Flow murmur. Abdomen protuberant.  General appearance :adequately nourished; in no distress . Eyes: No conjunctival inflammation or scleral icterus is present. Oral exam: Dental hygiene is good. Lips and gums are healthy appearing.There is no oropharyngeal erythema or exudate noted.  Lungs:Chest clear to auscultation; no wheezes, rhonchi,rales ,or rubs present.No increased work of breathing.  Abdomen: bowel sounds normal, soft and non-tender without masses, organomegaly or hernias noted.  Skin:Warm & dry.  Intact without suspicious lesions or rashes ; no jaundice or tenting Lymphatic: No lymphadenopathy is noted about the head, neck, axilla              Assessment & Plan:  #1 headaches; this may be related to her glaucoma and prolonged computer use with associated "glare". History does not suggest conversion of migraine to chronic daily headache. No localizing neurologic deficit.  #2 atrial fibrillation with rapid ventricular response, recurrence. She remains on her Xarelto. Her Cardiologist will be notified of this recurrence.  Plan: Ophth & Neuro evaluation if Tramadol ineffective. Tryptan not Rxed due to concern with cardiac hx

## 2014-04-08 NOTE — Progress Notes (Signed)
Pre visit review using our clinic review tool, if applicable. No additional management support is needed unless otherwise documented below in the visit note. 

## 2014-04-08 NOTE — Patient Instructions (Signed)
I recommend Ophthalmology consultation if headaches persist. Please keep a diary of your headaches . Document  each occurrence on the calendar with notation of : #1 any prodrome ( any non headache symptom such as marked fatigue,visual changes, ,etc ) which precedes actual headache ; #2) severity on 1-10 scale; #3) any triggers ( food/ drink,enviromenntal or weather changes ,physical or emotional stress) in 8-12 hour period prior to the headache; & #4) response to any medications or other intervention. Please review "Headache" @ [facility] for additional information.

## 2014-04-12 ENCOUNTER — Ambulatory Visit (INDEPENDENT_AMBULATORY_CARE_PROVIDER_SITE_OTHER): Payer: Medicare Other | Admitting: *Deleted

## 2014-04-12 ENCOUNTER — Telehealth: Payer: Self-pay | Admitting: *Deleted

## 2014-04-12 DIAGNOSIS — I4891 Unspecified atrial fibrillation: Secondary | ICD-10-CM

## 2014-04-12 NOTE — Patient Instructions (Addendum)
Your physician recommends that you schedule a follow-up appointment with Dr. Gwenlyn Found if your symptoms reoccurs. No changes were made today in your therapy.

## 2014-04-12 NOTE — Telephone Encounter (Signed)
Patient came in to office today to get EKG per her PCP Dr. Linna Darner. EKG  Results were reviewed and discussed with patient by Cecilie [name]- NP.

## 2014-04-17 ENCOUNTER — Other Ambulatory Visit: Payer: Self-pay | Admitting: Internal Medicine

## 2014-04-25 ENCOUNTER — Emergency Department (HOSPITAL_COMMUNITY)
Admission: EM | Admit: 2014-04-25 | Discharge: 2014-04-25 | Disposition: A | Discharge status: Home / Self Care | Payer: Medicare Other | Attending: Emergency Medicine | Admitting: Emergency Medicine

## 2014-04-25 ENCOUNTER — Telehealth: Payer: Self-pay | Admitting: Physician Assistant

## 2014-04-25 ENCOUNTER — Encounter (HOSPITAL_COMMUNITY): Payer: Self-pay | Admitting: Emergency Medicine

## 2014-04-25 DIAGNOSIS — R079 Chest pain, unspecified: Secondary | ICD-10-CM | POA: Diagnosis present

## 2014-04-25 DIAGNOSIS — Z8701 Personal history of pneumonia (recurrent): Secondary | ICD-10-CM | POA: Diagnosis not present

## 2014-04-25 DIAGNOSIS — I4891 Unspecified atrial fibrillation: Secondary | ICD-10-CM | POA: Diagnosis not present

## 2014-04-25 DIAGNOSIS — E785 Hyperlipidemia, unspecified: Secondary | ICD-10-CM | POA: Insufficient documentation

## 2014-04-25 DIAGNOSIS — K219 Gastro-esophageal reflux disease without esophagitis: Secondary | ICD-10-CM | POA: Insufficient documentation

## 2014-04-25 DIAGNOSIS — Z791 Long term (current) use of non-steroidal anti-inflammatories (NSAID): Secondary | ICD-10-CM | POA: Insufficient documentation

## 2014-04-25 DIAGNOSIS — Z9889 Other specified postprocedural states: Secondary | ICD-10-CM | POA: Insufficient documentation

## 2014-04-25 DIAGNOSIS — J45901 Unspecified asthma with (acute) exacerbation: Secondary | ICD-10-CM | POA: Diagnosis not present

## 2014-04-25 DIAGNOSIS — G8929 Other chronic pain: Secondary | ICD-10-CM | POA: Insufficient documentation

## 2014-04-25 DIAGNOSIS — E119 Type 2 diabetes mellitus without complications: Secondary | ICD-10-CM | POA: Insufficient documentation

## 2014-04-25 DIAGNOSIS — Z8739 Personal history of other diseases of the musculoskeletal system and connective tissue: Secondary | ICD-10-CM | POA: Diagnosis not present

## 2014-04-25 DIAGNOSIS — Z79899 Other long term (current) drug therapy: Secondary | ICD-10-CM | POA: Insufficient documentation

## 2014-04-25 DIAGNOSIS — I10 Essential (primary) hypertension: Secondary | ICD-10-CM | POA: Diagnosis not present

## 2014-04-25 DIAGNOSIS — J441 Chronic obstructive pulmonary disease with (acute) exacerbation: Secondary | ICD-10-CM | POA: Insufficient documentation

## 2014-04-25 DIAGNOSIS — Z862 Personal history of diseases of the blood and blood-forming organs and certain disorders involving the immune mechanism: Secondary | ICD-10-CM | POA: Insufficient documentation

## 2014-04-25 DIAGNOSIS — Z87891 Personal history of nicotine dependence: Secondary | ICD-10-CM | POA: Insufficient documentation

## 2014-04-25 DIAGNOSIS — IMO0002 Reserved for concepts with insufficient information to code with codable children: Secondary | ICD-10-CM | POA: Diagnosis not present

## 2014-04-25 DIAGNOSIS — I2510 Atherosclerotic heart disease of native coronary artery without angina pectoris: Secondary | ICD-10-CM | POA: Diagnosis not present

## 2014-04-25 LAB — CBC WITH DIFFERENTIAL/PLATELET
Basophils Absolute: 0.1 10*3/uL (ref 0.0–0.1)
Basophils Relative: 1 % (ref 0–1)
Eosinophils Absolute: 0.1 10*3/uL (ref 0.0–0.7)
Eosinophils Relative: 1 % (ref 0–5)
HCT: 37.6 % (ref 36.0–46.0)
Hemoglobin: 12.7 g/dL (ref 12.0–15.0)
Lymphocytes Relative: 13 % (ref 12–46)
Lymphs Abs: 1.3 10*3/uL (ref 0.7–4.0)
MCH: 26.8 pg (ref 26.0–34.0)
MCHC: 33.8 g/dL (ref 30.0–36.0)
MCV: 79.3 fL (ref 78.0–100.0)
Monocytes Absolute: 0.6 10*3/uL (ref 0.1–1.0)
Monocytes Relative: 6 % (ref 3–12)
Neutro Abs: 7.9 10*3/uL — ABNORMAL HIGH (ref 1.7–7.7)
Neutrophils Relative %: 79 % — ABNORMAL HIGH (ref 43–77)
Platelets: 339 10*3/uL (ref 150–400)
RBC: 4.74 MIL/uL (ref 3.87–5.11)
RDW: 12.5 % (ref 11.5–15.5)
WBC: 9.9 10*3/uL (ref 4.0–10.5)

## 2014-04-25 LAB — I-STAT TROPONIN, ED: Troponin i, poc: 0 ng/mL (ref 0.00–0.08)

## 2014-04-25 LAB — I-STAT CHEM 8, ED
BUN: 19 mg/dL (ref 6–23)
Calcium, Ion: 1.09 mmol/L — ABNORMAL LOW (ref 1.13–1.30)
Chloride: 108 mEq/L (ref 96–112)
Creatinine, Ser: 0.7 mg/dL (ref 0.50–1.10)
Glucose, Bld: 205 mg/dL — ABNORMAL HIGH (ref 70–99)
HCT: 41 % (ref 36.0–46.0)
Hemoglobin: 13.9 g/dL (ref 12.0–15.0)
Potassium: 4.3 mEq/L (ref 3.7–5.3)
Sodium: 129 mEq/L — ABNORMAL LOW (ref 137–147)
TCO2: 22 mmol/L (ref 0–100)

## 2014-04-25 LAB — MAGNESIUM: Magnesium: 1.8 mg/dL (ref 1.5–2.5)

## 2014-04-25 MED ORDER — SODIUM CHLORIDE 0.9 % IV BOLUS (SEPSIS)
500.0000 mL | Freq: Once | INTRAVENOUS | Status: AC
  Administered 2014-04-25: 500 mL via INTRAVENOUS

## 2014-04-25 MED ORDER — DILTIAZEM HCL 25 MG/5ML IV SOLN
10.0000 mg | Freq: Once | INTRAVENOUS | Status: AC
  Administered 2014-04-25: 10 mg via INTRAVENOUS
  Filled 2014-04-25: qty 5

## 2014-04-25 MED ORDER — ETOMIDATE 2 MG/ML IV SOLN
INTRAVENOUS | Status: AC | PRN
  Administered 2014-04-25: 5 mg via INTRAVENOUS

## 2014-04-25 MED ORDER — ETOMIDATE 2 MG/ML IV SOLN
5.0000 mg | Freq: Once | INTRAVENOUS | Status: DC
  Filled 2014-04-25: qty 10

## 2014-04-25 MED ORDER — METOPROLOL SUCCINATE ER 100 MG PO TB24: 100.0000 mg | ORAL_TABLET | Freq: Every day | ORAL | Status: DC

## 2014-04-25 NOTE — Sedation Documentation (Signed)
Pt tolerated procedure well alert and oriented at this time. Pt remains in sinus rhythm. Emilie given report and was present in room for cardioversion. Dr. [name] aware of pts status and given repeat EKG.

## 2014-04-25 NOTE — Telephone Encounter (Signed)
Pt S.O. Called because pt was feeling poorly, had a rapid and irregular pulse and she is concerned. Pt is not having chest pain or SOB, denies presyncope. HR is > 120, extremely variable, by home BP machine.  Recommended ER eval, but pt is refusing this. Advised that pt is probably back in atrial fib, but no diagnosis or definitive treatment possible unless she agrees.Her BP is very variable and low at times, so did not recommend giving additional atenolol at this time.  S.O. States she will try to get pt to go to Urgent Care or ER for eval. She is compliant with meds and has had all rx today.

## 2014-04-25 NOTE — Sedation Documentation (Signed)
Zoll at 150J

## 2014-04-25 NOTE — ED Notes (Signed)
Pt complaining of chest pain. Started around 2am. Feels like a pressure mid chest. BP: 140/73, P:120; 99% RA; 98.1 temp

## 2014-04-25 NOTE — ED Provider Notes (Signed)
CSN: [id number]     Arrival date & time 04/25/14  1635 History   First MD Initiated Contact with Patient 04/25/14 1648     Chief Complaint  Patient presents with  . Chest Pain     (Consider location/radiation/quality/duration/timing/severity/associated sxs/prior Treatment) Patient is a 63 y.o. female presenting with chest pain. The history is provided by the patient.  Chest Pain Associated symptoms: palpitations and shortness of breath   Associated symptoms: no abdominal pain, no back pain, no headache, no nausea, no numbness, not vomiting and no weakness    patient presents with mild shortness of breath. She states she feels as if she is in atrial fibrillation. She's a history of same. She has some dull chest tightness. No fevers. No cough. It began at around 2 in the morning last night. She's on his relative. She's had previous cardioversion. Abdominal pain. No lightheadedness or dizziness. No fevers. No cough. She has been taking her medications.  Past Medical History  Diagnosis Date  . Arthritis   . Asthma   . GERD (gastroesophageal reflux disease)   . Heart murmur   . Hyperlipidemia   . Hypertension   . Pneumonia 2008  . Goiter   . Diabetes mellitus     Type 2  . Anemia, unspecified   . Chronic low back pain   . Polycystic ovary disease     Hysterectomy in 1982 for this  . Bell's palsy     Facial nerve decompression in 2001  . Shortness of breath dyspnea     ECHO 02/26/08 showed only minor abnormalities  . Coronary artery disease     Myoview 04/12/11 was entirely normal. ECHO 02/26/08 showed only minor abnormalities. Stenting 05/26/08 of her posterolateral branch to the left circumflex coronary artery. Used a 2.5x84mm Taxus Monorail stent.myoview 2014 was without ischemia  . COPD with asthma   . Glaucoma   . Spinal stenosis   . History of nuclear stress test 2012; 2014    lexiscan; normal pattern of perfusion; normal, low risk scan   . Atrial fibrillation with RVR     a.  on Xarelto   Past Surgical History  Procedure Laterality Date  . Abdominal hysterectomy  1982    & BSO; for polycystic ovary disease  . Facial nerve decompression  2001/2002    bells palsy   . Laparoscopic cholecystectomy  06/15/2011     Dr Dalbert Batman  . Colonoscopy      last 2009; Dr Cristina Gong; due 2019  . Upper gi endoscopy  2009    negative  . Coronary angioplasty  05/26/2008    Stenting of her posterolateral branch to the left circumflex coronary artery. Used a 2.5x29mm Taxus Monorail stent.  . Transthoracic echocardiogram  3/53/6144    LV systolic function normal with mild conc LVH; LA mildly dilated; trace MR/TR  . Tee without cardioversion N/A 12/17/2013    Procedure: TRANSESOPHAGEAL ECHOCARDIOGRAM (TEE);  Surgeon: Pixie Casino, MD;  Location: [facility] ENDOSCOPY;  Service: Cardiovascular;  Laterality: N/A;  trish/ja  . Cardioversion N/A 12/17/2013    Procedure: CARDIOVERSION;  Surgeon: Pixie Casino, MD;  Location: [facility] ENDOSCOPY;  Service: Cardiovascular;  Laterality: N/A;   Family History  Problem Relation Age of Onset  . Heart attack Father 38    2nd MI at 97  . Colon cancer Brother 59  . Gout Brother   . Diabetes Neg Hx   . Stroke Neg Hx   . Ulcers Mother   . Emphysema Mother  62  . Colon polyps Sister   . Cancer Sister     Basal cell carcinoma  . Pneumonia Maternal Grandmother   . Hypertension Brother   . Hyperlipidemia Brother   . Cancer Brother     Skin   History  Substance Use Topics  . Smoking status: Former Smoker -- 1.50 packs/day for 30 years    Types: Cigarettes    Quit date: 08/13/1993  . Smokeless tobacco: Never Used  . Alcohol Use: No   OB History   Grav Para Term Preterm Abortions TAB SAB Ect Mult Living                 Review of Systems  Constitutional: Negative for activity change and appetite change.  Eyes: Negative for pain.  Respiratory: Positive for shortness of breath. Negative for chest tightness.   Cardiovascular: Positive for chest pain  and palpitations. Negative for leg swelling.  Gastrointestinal: Negative for nausea, vomiting, abdominal pain and diarrhea.  Genitourinary: Negative for flank pain.  Musculoskeletal: Negative for back pain and neck stiffness.  Skin: Negative for rash.  Neurological: Negative for weakness, numbness and headaches.  Psychiatric/Behavioral: Negative for behavioral problems.      Allergies  Benadryl; Clopidogrel bisulfate; and Contrast media  Home Medications   Prior to Admission medications   Medication Sig Start Date End Date Taking? Authorizing Provider  amLODipine (NORVASC) 10 MG tablet TAKE (1) TABLET DAILY FOR HIGH BLOOD PRESSURE. 03/18/14   Hendricks Limes, MD  ascorbic acid (VITAMIN C) 1000 MG tablet Take 1,000 mg by mouth 2 (two) times daily.     Historical Provider, MD  budesonide-formoterol (SYMBICORT) 160-4.5 MCG/ACT inhaler Inhale 2 puffs into the lungs 2 (two) times daily. 08/21/13   Darlyne Russian, MD  CALCIUM-MAG-VIT C-VIT D PO Take 2 tablets by mouth daily.     Historical Provider, MD  celecoxib (CELEBREX) 200 MG capsule Take 1 capsule by mouth 2 (two) times daily. 12/30/13   Historical Provider, MD  clobetasol cream (TEMOVATE) 7.86 % Apply 1 application topically daily.    Historical Provider, MD  clotrimazole-betamethasone (LOTRISONE) cream Apply 1 application topically 2 (two) times daily. 11/23/13   Hendricks Limes, MD  CRESTOR 10 MG tablet TAKE ONE TABLET AT BEDTIME. 02/18/14   Lorretta Harp, MD  estradiol (ESTRACE) 0.5 MG tablet Take 0.5 mg by mouth daily.  04/23/11   Historical Provider, MD  guaiFENesin (MUCINEX) 600 MG 12 hr tablet Take 600 mg by mouth 2 (two) times daily.    Historical Provider, MD  INVOKANA 100 MG TABS TAKE 1 TABLET ONCE DAILY. 04/20/14   Hendricks Limes, MD  isosorbide mononitrate (IMDUR) 30 MG 24 hr tablet Take 1 tablet (30 mg total) by mouth daily. 05/13/13   Cecilie [name], NP  JANUMET 50-1000 MG per tablet TAKE 1/2 TABLET IN THE MORNING WITH BREAKFAST  AND 1 TABLET IN THE EVENING WITH SUPPER.    Hendricks Limes, MD  latanoprost (XALATAN) 0.005 % ophthalmic solution Place 1 drop into both eyes at bedtime. 12/09/13   Historical Provider, MD  levalbuterol Penne Lash HFA) 45 MCG/ACT inhaler Inhale 1-2 puffs into the lungs every 6 (six) hours as needed for wheezing. 06/25/13   Mancel Bale, PA-C  levalbuterol (XOPENEX) 0.63 MG/3ML nebulizer solution Take 3 mLs (0.63 mg total) by nebulization every 6 (six) hours as needed for wheezing or shortness of breath. 08/21/13   Darlyne Russian, MD  LORazepam (ATIVAN) 0.5 MG tablet Take 1 mg  by mouth at bedtime.  04/23/11   Historical Provider, MD  Melatonin 5 MG TABS Take 1 tablet by mouth at bedtime.     Historical Provider, MD  metoprolol succinate (TOPROL-XL) 100 MG 24 hr tablet Take 1 tablet (100 mg total) by mouth daily. 04/25/14   Jasper Riling. [name], MD  Multiple Vitamins-Iron (MULTIVITAMINS WITH IRON) TABS Take 1 tablet by mouth 2 (two) times daily.     Historical Provider, MD  omega-3 acid ethyl esters (LOVAZA) 1 G capsule Take 1 g by mouth daily.     Historical Provider, MD  omeprazole (PRILOSEC) 20 MG capsule Take 20 mg by mouth daily.     Historical Provider, MD  [place] DELICA LANCETS FINE MISC 1 Units by Does not apply route as directed. 10/05/11   Midge Minium, MD  PARoxetine (PAXIL) 10 MG tablet Take 25 mg by mouth daily.     Historical Provider, MD  quinapril (ACCUPRIL) 40 MG tablet TAKE 1 TABLET ONCE DAILY. 02/18/14   Hendricks Limes, MD  senna (SENOKOT) 8.6 MG TABS Take 1 tablet by mouth daily.     Historical Provider, MD  traMADol (ULTRAM) 50 MG tablet Take 1 tablet (50 mg total) by mouth every 6 (six) hours as needed. 1 q 8 hrs prn 04/08/14   Hendricks Limes, MD  XARELTO 20 MG TABS tablet TAKE 1 TABLET EACH DAY. 01/12/14   Lorretta Harp, MD   BP 107/40  Pulse 52  Temp(Src) 98.1 F (36.7 C) (Oral)  Resp 22  SpO2 100% Physical Exam  Nursing note and vitals reviewed. Constitutional: She  is oriented to person, place, and time. She appears well-developed and well-nourished.  HENT:  Head: Normocephalic and atraumatic.  Eyes: EOM are normal. Pupils are equal, round, and reactive to light.  Neck: Normal range of motion. Neck supple.  Cardiovascular: Normal heart sounds.   No murmur heard. Irregular rhythm with mild tachycardia  Pulmonary/Chest: Effort normal and breath sounds normal. No respiratory distress. She has no wheezes. She has no rales.  Abdominal: Soft. Bowel sounds are normal. She exhibits no distension. There is no tenderness. There is no rebound and no guarding.  Musculoskeletal: Normal range of motion.  Neurological: She is alert and oriented to person, place, and time. No cranial nerve deficit.  Skin: Skin is warm and dry.  Psychiatric: She has a normal mood and affect. Her speech is normal.    ED Course  CARDIOVERSION Date/Time: 04/25/2014 7:20 PM Performed by: Davonna Belling R. Authorized by: Mackie Pai Consent: Verbal consent obtained. written consent obtained. Risks and benefits: risks, benefits and alternatives were discussed Consent given by: patient Patient understanding: patient states understanding of the procedure being performed Patient consent: the patient's understanding of the procedure matches consent given Relevant documents: relevant documents present and verified Required items: required blood products, implants, devices, and special equipment available Patient identity confirmed: verbally with patient, arm band and provided demographic data Time out: Immediately prior to procedure a "time out" was called to verify the correct patient, procedure, equipment, support staff and site/side marked as required. Patient sedated: yes Sedation type: moderate (conscious) sedation Sedatives: etomidate Sedation start date/time: 04/25/2014 7:15 PM Sedation end date/time: 04/25/2014 7:20 PM Vitals: Vital signs were monitored during  sedation. Cardioversion basis: elective Indications: failure of anti-arrhythmic medications Pre-procedure rhythm: atrial fibrillation Patient position: patient was placed in a supine position Chest area: chest area exposed Electrodes: pads Electrodes placed: anterior-posterior Number of attempts: 1 Attempt 1  mode: synchronous Attempt 1 waveform: biphasic Attempt 1 shock (in Joules): 150 Attempt 1 outcome: conversion to normal sinus rhythm Post-procedure rhythm: normal sinus rhythm Complications: no complications Patient tolerance: Patient tolerated the procedure well with no immediate complications.   (including critical care time) Labs Review Labs Reviewed  CBC WITH DIFFERENTIAL - Abnormal; Notable for the following:    Neutrophils Relative % 79 (*)    Neutro Abs 7.9 (*)    All other components within normal limits  I-STAT CHEM 8, ED - Abnormal; Notable for the following:    Sodium 129 (*)    Glucose, Bld 205 (*)    Calcium, Ion 1.09 (*)    All other components within normal limits  MAGNESIUM  I-STAT TROPOININ, ED    Imaging Review No results found.   EKG Interpretation   Date/Time:  Sunday April 25 2014 16:43:03 EDT Ventricular Rate:  113 PR Interval:    QRS Duration: 84 QT Interval:  318 QTC Calculation: 436 R Axis:   12 Text Interpretation:  Atrial fibrillation with rapid ventricular response  Abnormal QRS-T angle, consider primary T wave abnormality Abnormal ECG  Confirmed by [name]  MD, Ovid Curd [phone]) on 04/25/2014 4:56:02 PM      MDM   Final diagnoses:  Atrial fibrillation with RVR    Patient with atrial fibrillation with RVR. History of same and is on anticoagulation. No chest pain. Discussed with cardiology and we'll cardiovert. Will change from Tenormin to metoprolol, however patient will call her cardiologist tomorrow. Patient was converted back to sinus rhythm.    Jasper Riling. [name], MD 04/25/14 [phone]

## 2014-04-25 NOTE — ED Notes (Signed)
MD at bedside. 

## 2014-04-25 NOTE — Discharge Instructions (Signed)
Atrial Fibrillation  Atrial fibrillation is a type of irregular heart rhythm (arrhythmia). During atrial fibrillation, the upper chambers of the heart (atria) quiver continuously in a chaotic pattern. This causes an irregular and often rapid heart rate.   Atrial fibrillation is the result of the heart becoming overloaded with disorganized signals that tell it to beat. These signals are normally released one at a time by a part of the right atrium called the sinoatrial node. They then travel from the atria to the lower chambers of the heart (ventricles), causing the atria and ventricles to contract and pump blood as they pass. In atrial fibrillation, parts of the atria outside of the sinoatrial node also release these signals. This results in two problems. First, the atria receive so many signals that they do not have time to fully contract. Second, the ventricles, which can only receive one signal at a time, beat irregularly and out of rhythm with the atria.   There are three types of atrial fibrillation:   · Paroxysmal. Paroxysmal atrial fibrillation starts suddenly and stops on its own within a week.  · Persistent. Persistent atrial fibrillation lasts for more than a week. It may stop on its own or with treatment.  · Permanent. Permanent atrial fibrillation does not go away. Episodes of atrial fibrillation may lead to permanent atrial fibrillation.  Atrial fibrillation can prevent your heart from pumping blood normally. It increases your risk of stroke and can lead to heart failure.   CAUSES   · Heart conditions, including a heart attack, heart failure, coronary artery disease, and heart valve conditions.    · Inflammation of the sac that surrounds the heart (pericarditis).  · Blockage of an artery in the lungs (pulmonary embolism).  · Pneumonia or other infections.  · Chronic lung disease.  · Thyroid problems, especially if the thyroid is overactive (hyperthyroidism).  · Caffeine, excessive alcohol use, and use  of some illegal drugs.    · Use of some medicines, including certain decongestants and diet pills.  · Heart surgery.    · Birth defects.    Sometimes, no cause can be found. When this happens, the atrial fibrillation is called lone atrial fibrillation. The risk of complications from atrial fibrillation increases if you have lone atrial fibrillation and you are age 60 years or older.  RISK FACTORS  · Heart failure.  · Coronary artery disease.  · Diabetes mellitus.    · High blood pressure (hypertension).    · Obesity.    · Other arrhythmias.    · Increased age.  SIGNS AND SYMPTOMS   · A feeling that your heart is beating rapidly or irregularly.    · A feeling of discomfort or pain in your chest.    · Shortness of breath.    · Sudden light-headedness or weakness.    · Getting tired easily when exercising.    · Urinating more often than normal (mainly when atrial fibrillation first begins).    In paroxysmal atrial fibrillation, symptoms may start and suddenly stop.  DIAGNOSIS   Your health care provider may be able to detect atrial fibrillation when taking your pulse. Your health care provider may have you take a test called an ambulatory electrocardiogram (ECG). An ECG records your heartbeat patterns over a 24-hour period. You may also have other tests, such as:  · Transthoracic echocardiogram (TTE). During echocardiography, sound waves are used to evaluate how blood flows through your heart.  · Transesophageal echocardiogram (TEE).  · Stress test. There is more than one type of stress test. If a stress   test is needed, ask your health care provider about which type is best for you.  · Chest X-ray exam.  · Blood tests.  · Computed tomography (CT).  TREATMENT   Treatment may include:  · Treating any underlying conditions. For example, if you have an overactive thyroid, treating the condition may correct atrial fibrillation.  · Taking medicine. Medicines may be given to control a rapid heart rate or to prevent blood  clots, heart failure, or a stroke.  · Having a procedure to correct the rhythm of the heart:  · Electrical cardioversion. During electrical cardioversion, a controlled, low-energy shock is delivered to the heart through your skin. If you have chest pain, very low blood pressure, or sudden heart failure, this procedure may need to be done as an emergency.  · Catheter ablation. During this procedure, heart tissues that send the signals that cause atrial fibrillation are destroyed.  · Surgical ablation. During this surgery, thin lines of heart tissue that carry the abnormal signals are destroyed. This procedure can either be an open-heart surgery or a minimally invasive surgery. With the minimally invasive surgery, small cuts are made to access the heart instead of a large opening.  · Pulmonary venous isolation. During this surgery, tissue around the veins that carry blood from the lungs (pulmonary veins) is destroyed. This tissue is thought to carry the abnormal signals.  HOME CARE INSTRUCTIONS   · Take medicines only as directed by your health care provider. Some medicines can make atrial fibrillation worse or recur.  · If blood thinners were prescribed by your health care provider, take them exactly as directed. Too much blood-thinning medicine can cause bleeding. If you take too little, you will not have the needed protection against stroke and other problems.  · Perform blood tests at home if directed by your health care provider. Perform blood tests exactly as directed.  · Quit smoking if you smoke.  · Do not drink alcohol.  · Do not drink caffeinated beverages such as coffee, soda, and some teas. You may drink decaffeinated coffee, soda, or tea.    · Maintain a healthy weight. Do not use diet pills unless your health care provider approves. They may make heart problems worse.    · Follow diet instructions as directed by your health care provider.  · Exercise regularly as directed by your health care  provider.  · Keep all follow-up visits as directed by your health care provider. This is important.  PREVENTION   The following substances can cause atrial fibrillation to recur:   · Caffeinated beverages.  · Alcohol.  · Certain medicines, especially those used for breathing problems.  · Certain herbs and herbal medicines, such as those containing ephedra or ginseng.  · Illegal drugs, such as cocaine and amphetamines.  Sometimes medicines are given to prevent atrial fibrillation from recurring. Proper treatment of any underlying condition is also important in helping prevent recurrence.   SEEK MEDICAL CARE IF:  · You notice a change in the rate, rhythm, or strength of your heartbeat.  · You suddenly begin urinating more frequently.  · You tire more easily when exerting yourself or exercising.  SEEK IMMEDIATE MEDICAL CARE IF:   · You have chest pain, abdominal pain, sweating, or weakness.  · You feel nauseous.  · You have shortness of breath.  · You suddenly have swollen feet and ankles.  · You feel dizzy.  · Your face or limbs feel numb or weak.  · You have a change   in your vision or speech.  MAKE SURE YOU:   · Understand these instructions.  · Will watch your condition.  · Will get help right away if you are not doing well or get worse.  Document Released: 07/30/2005 Document Revised: 12/14/2013 Document Reviewed: 09/09/2012  ExitCare® Patient Information ©2015 [employer]. This information is not intended to replace advice given to you by your health care provider. Make sure you discuss any questions you have with your health care provider.  Electrical Cardioversion  Electrical cardioversion is the delivery of a jolt of electricity to change the rhythm of the heart. Sticky patches or metal paddles are placed on the chest to deliver the electricity from a device. This is done to restore a normal rhythm. A rhythm that is too fast or not regular keeps the heart from pumping well.  Electrical cardioversion is done  in an emergency if:   · There is low or no blood pressure as a result of the heart rhythm.    · Normal rhythm must be restored as fast as possible to protect the brain and heart from further damage.    · It may save a life.  Cardioversion may be done for heart rhythms that are not immediately life threatening, such as atrial fibrillation or flutter, in which:   · The heart is beating too fast or is not regular.    · Medicine to change the rhythm has not worked.    · It is safe to wait in order to allow time for preparation.  · Symptoms of the abnormal rhythm are bothersome.  · The risk of stroke and other serious problems can be reduced.  LET YOUR HEALTH CARE PROVIDER KNOW ABOUT:   · Any allergies you have.  · All medicines you are taking, including vitamins, herbs, eye drops, creams, and over-the-counter medicines.  · Previous problems you or members of your family have had with the use of anesthetics.    · Any blood disorders you have.    · Previous surgeries you have had.    · Medical conditions you have.  RISKS AND COMPLICATIONS   Generally, this is a safe procedure. However, problems can occur and include:   · Breathing problems related to the anesthetic used.  · A blood clot that breaks free and travels to other parts of your body. This could cause a stroke or other problems. The risk of this is lowered by use of blood-thinning medicine (anticoagulant) prior to the procedure.  · Cardiac arrest (rare).  BEFORE THE PROCEDURE   · You may have tests to detect blood clots in your heart and to evaluate heart function.   · You may start taking anticoagulants so your blood does not clot as easily.    · Medicines may be given to help stabilize your heart rate and rhythm.  PROCEDURE  · You will be given medicine through an IV tube to reduce discomfort and make you sleepy (sedative).    · An electrical shock will be delivered.  AFTER THE PROCEDURE  Your heart rhythm will be watched to make sure it does not change.    Document Released: 07/20/2002 Document Revised: 12/14/2013 Document Reviewed: 02/11/2013  ExitCare® Patient Information ©2015 [employer]. This information is not intended to replace advice given to you by your health care provider. Make sure you discuss any questions you have with your health care provider.

## 2014-04-26 ENCOUNTER — Encounter: Payer: Self-pay | Admitting: Internal Medicine

## 2014-04-26 ENCOUNTER — Other Ambulatory Visit: Payer: Self-pay | Admitting: Internal Medicine

## 2014-04-26 ENCOUNTER — Telehealth: Payer: Self-pay | Admitting: Cardiovascular Disease

## 2014-04-26 DIAGNOSIS — R51 Headache: Secondary | ICD-10-CM

## 2014-04-26 MED ORDER — TRAMADOL HCL 50 MG PO TABS: 50.0000 mg | ORAL_TABLET | Freq: Four times a day (QID) | ORAL | Status: DC | PRN

## 2014-04-26 NOTE — Telephone Encounter (Signed)
Returned call to patient no answer.LMTC. 

## 2014-04-26 NOTE — Telephone Encounter (Signed)
I see no reason why she should be changed from Toprol to 200 and. She had an episode of A. Fib and was here in the ER. She probably should be seen back by mid-level provider.

## 2014-04-26 NOTE — Telephone Encounter (Signed)
Tramadol script has been faxed to [facility]

## 2014-04-26 NOTE — Telephone Encounter (Signed)
Returned call to patient.She stated she went to [facility] ER yesterday 04/25/14 with atrial fib.Stated ER Dr.prescribed Toprol XL 100 mg instead of Tenormin.Stated she wanted to ask Dr.Berry before she started taking.Message sent to [facility].

## 2014-04-26 NOTE — Telephone Encounter (Signed)
Went to ER last night,had a conversion for atrial fib. She was given a prescription for Toprol XL 100 mg,Can she take this to the pharmacy?This is taking the place of Tenormin.

## 2014-04-28 ENCOUNTER — Other Ambulatory Visit: Payer: Self-pay | Admitting: Internal Medicine

## 2014-05-03 NOTE — Telephone Encounter (Signed)
lmom 

## 2014-05-04 ENCOUNTER — Telehealth: Payer: Self-pay | Admitting: Cardiovascular Disease

## 2014-05-04 NOTE — Telephone Encounter (Signed)
Pt stated that she was returning Kathryn's call from yesterday in reference to her recent ER visit and a medication change. Please call  Thanks

## 2014-05-04 NOTE — Telephone Encounter (Signed)
I spoke with patient.  She has followed ER discharge instructions that ordered metoprolol 100mg  daily.  She has been doing well until an episode this am.  Her heart rate was 120 and she felt poorly.  The episode didn't last long and she feels good now.  I advised Ms Niesen to contact our office if she has another episode or feels poorly.  She has a follow up with an APP in October.

## 2014-05-04 NOTE — Telephone Encounter (Signed)
Spoke with patient. See other telephone note. 

## 2014-05-18 ENCOUNTER — Ambulatory Visit: Payer: BC Managed Care – PPO | Admitting: Neurology

## 2014-05-20 ENCOUNTER — Encounter: Payer: Self-pay | Admitting: Physician Assistant

## 2014-05-20 ENCOUNTER — Ambulatory Visit (INDEPENDENT_AMBULATORY_CARE_PROVIDER_SITE_OTHER): Payer: Medicare Other | Admitting: Physician Assistant

## 2014-05-20 VITALS — BP 112/60 | HR 60 | Ht 62.0 in | Wt 195.5 lb

## 2014-05-20 DIAGNOSIS — I10 Essential (primary) hypertension: Secondary | ICD-10-CM

## 2014-05-20 DIAGNOSIS — I2510 Atherosclerotic heart disease of native coronary artery without angina pectoris: Secondary | ICD-10-CM

## 2014-05-20 DIAGNOSIS — E785 Hyperlipidemia, unspecified: Secondary | ICD-10-CM

## 2014-05-20 DIAGNOSIS — I4891 Unspecified atrial fibrillation: Secondary | ICD-10-CM

## 2014-05-20 DIAGNOSIS — I480 Paroxysmal atrial fibrillation: Secondary | ICD-10-CM

## 2014-05-20 NOTE — Assessment & Plan Note (Signed)
Blood pressure well controlled no changes in therapy

## 2014-05-20 NOTE — Progress Notes (Signed)
Date:  05/20/2014   ID:  [name], DOB 13-Feb-1951, MRN [id number]  PCP:  Unice Cobble, MD  Primary Cardiologist:  Gwenlyn Found    History of Present Illness: [name] is a 63 y.o. female  The patient is a very pleasant 63 year old moderately overweight divorced Caucasian female, mother of 2 adopted children, ages 70 and 31, who is formerly a patient of Dr. Ky Barban. 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 Her risk factors include former tobacco abuse, having quit 18 years ago, diabetes, hypertension, hyperlipidemia, and a positive family history for heart disease. She has adult-onset asthma as well. Dr. Melvern Banker stented her distal circumflex posterolateral branch, May 26, 2008, with some residual disease in the PDA branch, and normal LV function. Myoview performed May 06, 2008, was not ischemic.Dr Gwenlyn Found performed cardiac catheterization on her, April 20, 2011, revealing a patent stent, 50% distal PDA lesion, with normal LV function.  He thought her disease was stable and had not progressed, and recommended medical therapy. She has had no recurrent chest pain since.  She saw Cecilie [name] in the office 05/13/13 with chest pain. A subsequent Myoview stress test was entirely normal.  She was admitted in early May 2015 atrial fibrillation with rapid ventricular response and  underwent transesophageal guided DC cardioversion by Dr. [name] successfully with one shock and was placed Xarelto.  She was seen in the emergency room 04/25/2014 with atrial fibrillation with rapid ventricular response.  She was consistently anticoagulated with Xarelto.  She underwent direct cardioversion while in the emergency room. Atenolol was changed to Toprol 100 mg. She reported one episode of atrial fibrillation since the cardioversion which lasted about one day. She knows when she presented because she gets nauseated and has a headache.  EKG today shows normal sinus rhythm with a rate  60 beats per minute.  We'll continue the current dose of Toprol.  Her blood pressure is well-controlled and might not tolerate any extra daily dosing. She may tolerate one time, as needed, dose of Lopressor in the future if she has more frequent episodes.  She is on a few other BP meds which could be reduced.  She otherwise has no complaints feels quite well.  The patient currently denies nausea, vomiting, fever, chest pain, shortness of breath, orthopnea, dizziness, PND, cough, congestion, abdominal pain, hematochezia, melena, lower extremity edema, claudication.  Wt Readings from Last 3 Encounters:  05/20/14 195 lb 8 oz (88.678 kg)  04/08/14 195 lb 4 oz (88.565 kg)  04/01/14 196 lb 8 oz (89.132 kg)     Past Medical History  Diagnosis Date  . Arthritis   . Asthma   . GERD (gastroesophageal reflux disease)   . Heart murmur   . Hyperlipidemia   . Hypertension   . Pneumonia 2008  . Goiter   . Diabetes mellitus     Type 2  . Anemia, unspecified   . Chronic low back pain   . Polycystic ovary disease     Hysterectomy in 1982 for this  . Bell's palsy     Facial nerve decompression in 2001  . Shortness of breath dyspnea     ECHO 02/26/08 showed only minor abnormalities  . Coronary artery disease     Myoview 04/12/11 was entirely normal. ECHO 02/26/08 showed only minor abnormalities. Stenting 05/26/08 of her posterolateral branch to the left circumflex coronary artery. Used a 2.5x68mm Taxus Monorail stent.myoview 2014 was without ischemia  . COPD with asthma   .  Glaucoma   . Spinal stenosis   . History of nuclear stress test 2012; 2014    lexiscan; normal pattern of perfusion; normal, low risk scan   . Atrial fibrillation with RVR     a. on Xarelto    Current Outpatient Prescriptions  Medication Sig Dispense Refill  . amLODipine (NORVASC) 10 MG tablet TAKE (1) TABLET DAILY FOR HIGH BLOOD PRESSURE.  30 tablet  5  . ascorbic acid (VITAMIN C) 1000 MG tablet Take 1,000 mg by mouth 2 (two)  times daily.       . budesonide-formoterol (SYMBICORT) 160-4.5 MCG/ACT inhaler Inhale 2 puffs into the lungs 2 (two) times daily.  1 Inhaler  3  . CALCIUM-MAG-VIT C-VIT D PO Take 2 tablets by mouth daily.       . celecoxib (CELEBREX) 200 MG capsule Take 1 capsule by mouth 2 (two) times daily.      . clobetasol cream (TEMOVATE) 0.10 % Apply 1 application topically daily.      . CRESTOR 10 MG tablet TAKE ONE TABLET AT BEDTIME.  30 tablet  6  . estradiol (ESTRACE) 0.5 MG tablet Take 0.5 mg by mouth daily.       [name] guaiFENesin (MUCINEX) 600 MG 12 hr tablet Take 600 mg by mouth 2 (two) times daily.      . INVOKANA 100 MG TABS TAKE 1 TABLET ONCE DAILY.  30 tablet  5  . isosorbide mononitrate (IMDUR) 30 MG 24 hr tablet Take 1 tablet (30 mg total) by mouth daily.  30 tablet  6  . JANUMET 50-1000 MG per tablet TAKE 1/2 TABLET IN THE MORNING WITH BREAKFAST AND 1 TABLET IN THE EVENING WITH SUPPER.  45 tablet  1  . latanoprost (XALATAN) 0.005 % ophthalmic solution Place 1 drop into both eyes at bedtime.      . levalbuterol (XOPENEX HFA) 45 MCG/ACT inhaler Inhale 1-2 puffs into the lungs every 6 (six) hours as needed for wheezing.  1 Inhaler  1  . levalbuterol (XOPENEX) 0.63 MG/3ML nebulizer solution Take 3 mLs (0.63 mg total) by nebulization every 6 (six) hours as needed for wheezing or shortness of breath.  75 mL  1  . LORazepam (ATIVAN) 0.5 MG tablet Take 1 mg by mouth at bedtime.       . Melatonin 5 MG TABS Take 1 tablet by mouth at bedtime.       . metoprolol succinate (TOPROL-XL) 100 MG 24 hr tablet Take 1 tablet (100 mg total) by mouth daily.  30 tablet  0  . Multiple Vitamins-Iron (MULTIVITAMINS WITH IRON) TABS Take 1 tablet by mouth 2 (two) times daily.       [name] omega-3 acid ethyl esters (LOVAZA) 1 G capsule Take 1 g by mouth daily.       [name] omeprazole (PRILOSEC) 20 MG capsule Take 20 mg by mouth daily.       Glory Rosebush DELICA LANCETS FINE MISC 1 Units by Does not apply route as directed.  100 each  3    . PARoxetine (PAXIL) 10 MG tablet Take 25 mg by mouth daily.       . quinapril (ACCUPRIL) 40 MG tablet TAKE 1 TABLET ONCE DAILY.  30 tablet  5  . senna (SENOKOT) 8.6 MG TABS Take 1 tablet by mouth daily.       . traMADol (ULTRAM) 50 MG tablet Take 1 tablet (50 mg total) by mouth every 6 (six) hours as needed. 1 q 8 hrs prn  30 tablet  1  . XARELTO 20 MG TABS tablet TAKE 1 TABLET EACH DAY.  30 tablet  11   No current facility-administered medications for this visit.    Allergies:    Allergies  Allergen Reactions  . Benadryl [Diphenhydramine Hcl] Other (See Comments)    Restless leg  . Clopidogrel Bisulfate     Nausea & pain  . Contrast Media [Iodinated Diagnostic Agents] Palpitations    Rapid heart rate, hot    Social History:  The patient  reports that she quit smoking about 20 years ago. Her smoking use included Cigarettes. She has a 45 pack-year smoking history. She has never used smokeless tobacco. She reports that she does not drink alcohol or use illicit drugs.   Family history:   Family History  Problem Relation Age of Onset  . Heart attack Father 3    2nd MI at 22  . Colon cancer Brother 56  . Gout Brother   . Diabetes Neg Hx   . Stroke Neg Hx   . Ulcers Mother   . Emphysema Mother 27  . Colon polyps Sister   . Cancer Sister     Basal cell carcinoma  . Pneumonia Maternal Grandmother   . Hypertension Brother   . Hyperlipidemia Brother   . Cancer Brother     Skin    ROS:  Please see the history of present illness.  All other systems reviewed and negative.   PHYSICAL EXAM: VS:  BP 112/60  Pulse 60  Ht 5\' 2"  (1.575 m)  Wt 195 lb 8 oz (88.678 kg)  BMI 35.75 kg/m2 Obese, well developed, in no acute distress HEENT: Pupils are equal round react to light accommodation extraocular movements are intact.  Cardiac: Regular rate and rhythm without murmurs rubs or gallops. Lungs:  clear to auscultation bilaterally, no wheezing, rhonchi or rales Abd: soft, nontender,  positive bowel sounds all quadrants Ext: no lower extremity edema.  2+ radial and dorsalis pedis pulses. Skin: warm and dry Neuro:  Grossly normal  EKG:  Normal Sinus rhythm. 60 beats per minute.   ASSESSMENT AND PLAN:  Problem List Items Addressed This Visit   Atrial fibrillation with RVR     Patient was seen in the emergency room 04/25/2014 intrafibrillation rapid ventricular response.  She consistently anticoagulated with Xarelto.  She underwent direct cardioversion while in the emergency room. Atenolol was changed to Toprol 100 mg. She reported one episode of atrial fibrillation since the cardioversion which lasted about one day. She knows when she presented because she gets nauseated and has a headache.  EKG today shows normal sinus rhythm with a rate 60 beats per minute.  We'll continue the current dose of Toprol.  Her blood pressure is well-controlled and might not tolerate any extra daily dosing. She may tolerate one time, as needed, dose of Lopressor in the future if she has more frequent episodes.  She otherwise has no complaints feels quite well.    CAD (coronary artery disease), hx of LCX stent DES 2009,  last cath 2012 50-60% RCA stenosis, patent stent, 30 % LM. (Chronic)   Essential hypertension     Blood pressure well controlled no changes in therapy    Hyperlipidemia      On Crestor. Recent lipid panel will Controlled:  Lipid Panel     Component Value Date/Time   CHOL 135 04/01/2014 1444   TRIG 122.0 04/01/2014 1444   HDL 49.70 04/01/2014 1444   CHOLHDL 3 04/01/2014 1444  VLDL 24.4 04/01/2014 1444   LDLCALC 61 04/01/2014 1444   LDLDIRECT 143.4 01/23/2008 1316         Other Visit Diagnoses   Paroxysmal a-fib    -  Primary    Relevant Orders       EKG 12-Lead

## 2014-05-20 NOTE — Assessment & Plan Note (Signed)
Patient was seen in the emergency room 04/25/2014 intrafibrillation rapid ventricular response.  She consistently anticoagulated with Xarelto.  She underwent direct cardioversion while in the emergency room. Atenolol was changed to Toprol 100 mg. She reported one episode of atrial fibrillation since the cardioversion which lasted about one day. She knows when she presented because she gets nauseated and has a headache.  EKG today shows normal sinus rhythm with a rate 60 beats per minute.  We'll continue the current dose of Toprol.  Her blood pressure is well-controlled and might not tolerate any extra daily dosing. She may tolerate one time, as needed, dose of Lopressor in the future if she has more frequent episodes.  She otherwise has no complaints feels quite well.

## 2014-05-20 NOTE — Patient Instructions (Signed)
1.  Followup with Dr. Gwenlyn Found in November.

## 2014-05-20 NOTE — Assessment & Plan Note (Signed)
On Crestor. Recent lipid panel will Controlled:  Lipid Panel     Component Value Date/Time   CHOL 135 04/01/2014 1444   TRIG 122.0 04/01/2014 1444   HDL 49.70 04/01/2014 1444   CHOLHDL 3 04/01/2014 1444   VLDL 24.4 04/01/2014 1444   LDLCALC 61 04/01/2014 1444   LDLDIRECT 143.4 01/23/2008 1316

## 2014-05-21 ENCOUNTER — Other Ambulatory Visit: Payer: Self-pay | Admitting: Internal Medicine

## 2014-06-08 ENCOUNTER — Ambulatory Visit: Payer: BC Managed Care – PPO | Admitting: Neurology

## 2014-06-21 ENCOUNTER — Other Ambulatory Visit: Payer: Self-pay | Admitting: Internal Medicine

## 2014-06-23 ENCOUNTER — Other Ambulatory Visit: Payer: Self-pay | Admitting: Gastroenterology

## 2014-06-23 DIAGNOSIS — R1310 Dysphagia, unspecified: Secondary | ICD-10-CM

## 2014-06-28 ENCOUNTER — Ambulatory Visit: Payer: BC Managed Care – PPO | Admitting: Neurology

## 2014-06-29 ENCOUNTER — Ambulatory Visit (INDEPENDENT_AMBULATORY_CARE_PROVIDER_SITE_OTHER): Payer: Medicare Other | Admitting: Cardiovascular Disease

## 2014-06-29 ENCOUNTER — Encounter: Payer: Self-pay | Admitting: Cardiovascular Disease

## 2014-06-29 VITALS — BP 130/60 | HR 68 | Ht 62.0 in | Wt 196.8 lb

## 2014-06-29 DIAGNOSIS — I2583 Coronary atherosclerosis due to lipid rich plaque: Principal | ICD-10-CM

## 2014-06-29 DIAGNOSIS — I4891 Unspecified atrial fibrillation: Secondary | ICD-10-CM

## 2014-06-29 DIAGNOSIS — I10 Essential (primary) hypertension: Secondary | ICD-10-CM

## 2014-06-29 DIAGNOSIS — I2510 Atherosclerotic heart disease of native coronary artery without angina pectoris: Secondary | ICD-10-CM

## 2014-06-29 DIAGNOSIS — E785 Hyperlipidemia, unspecified: Secondary | ICD-10-CM

## 2014-06-29 MED ORDER — METOPROLOL TARTRATE 25 MG PO TABS: ORAL_TABLET | ORAL | Status: DC

## 2014-06-29 NOTE — Progress Notes (Signed)
06/29/2014 [name]   January 31, 1951  [id number]  Primary Physician Unice Cobble, MD Primary Cardiologist: Lorretta Harp MD [name]   HPI:  The patient is a very pleasant 63 year old moderately overweight divorced Caucasian female, mother of 2 adopted children, ages 76 and 99, who is formerly a patient of Dr. Ky Barban. I saw her 6 months  ago. 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 Her risk factors include former tobacco abuse, having quit 18 years ago, diabetes, hypertension, hyperlipidemia, and a positive family history for heart disease. She has adult-onset asthma as well. Dr. Melvern Banker stented her distal circumflex posterolateral branch, May 26, 2008, with some residual disease in the PDA branch, and normal LV function. Myoview performed May 06, 2008, was not ischemic. I saw her summer of last year, at which time she was complaining of chest pain, for which I prescribed Imdur, which she never filled. I suggested a stress test, which was never obtained. Because of her recurrent chest tightness and increasing dyspnea with radiation to her left neck, I performed cardiac catheterization on her, April 20, 2011, revealing a patent stent, 50% distal PDA lesion, with normal LV function. I thought her disease was stable and had not progressed, and recommended medical therapy. She has had no recurrent chest pain since I last saw her. She has some mild dyspnea. Recent lab work /2/13 revealed a total cholesterol of 129, LDL of 63, and HDL of 45.  She saw Cecilie [name] in the office 05/13/13 with chest pain. A subsequent Myoview stress test was entirely normal. She was admitted to the hospital earlier this month with a good RPR which by history had been on for several weeks but prior. She was symptomatic. She'll underwent transesophageal guided DC cardioversion by Dr. [name] successfully with one shock and was placed  on 0 out to oral anticoagulation. She felt markedly improved clinically. She had a recurrent episode of atrial fibrillation 04/25/14 and underwent DC cardioversion by Dr. [name] in the ER. Her atenolol was changed to Toprol-XL 100 mg. She has felt well since.  Current Outpatient Prescriptions  Medication Sig Dispense Refill  . amLODipine (NORVASC) 10 MG tablet TAKE (1) TABLET DAILY FOR HIGH BLOOD PRESSURE. 30 tablet 5  . ascorbic acid (VITAMIN C) 1000 MG tablet Take 1,000 mg by mouth 2 (two) times daily.     . budesonide-formoterol (SYMBICORT) 160-4.5 MCG/ACT inhaler Inhale 2 puffs into the lungs 2 (two) times daily. 1 Inhaler 3  . CALCIUM-MAG-VIT C-VIT D PO Take 2 tablets by mouth daily.     . celecoxib (CELEBREX) 200 MG capsule Take 1 capsule by mouth 2 (two) times daily.    . clobetasol cream (TEMOVATE) 5.88 % Apply 1 application topically daily.    . CRESTOR 10 MG tablet TAKE ONE TABLET AT BEDTIME. 30 tablet 6  . estradiol (ESTRACE) 0.5 MG tablet Take 0.5 mg by mouth daily.     [name] guaiFENesin (MUCINEX) 600 MG 12 hr tablet Take 600 mg by mouth 2 (two) times daily.    . INVOKANA 100 MG TABS TAKE 1 TABLET ONCE DAILY. 30 tablet 5  . isosorbide mononitrate (IMDUR) 30 MG 24 hr tablet Take 1 tablet (30 mg total) by mouth daily. 30 tablet 6  . JANUMET 50-1000 MG per tablet TAKE 1/2 TABLET IN THE MORNING WITH BREAKFAST AND 1 TABLET IN THE EVENING WITH SUPPER. 45 tablet 5  . latanoprost (XALATAN) 0.005 %  ophthalmic solution Place 1 drop into both eyes at bedtime.    . levalbuterol (XOPENEX HFA) 45 MCG/ACT inhaler Inhale 1-2 puffs into the lungs every 6 (six) hours as needed for wheezing. 1 Inhaler 1  . levalbuterol (XOPENEX) 0.63 MG/3ML nebulizer solution Take 3 mLs (0.63 mg total) by nebulization every 6 (six) hours as needed for wheezing or shortness of breath. 75 mL 1  . LORazepam (ATIVAN) 0.5 MG tablet Take 1 mg by mouth at bedtime.     . Melatonin 5 MG TABS Take 1 tablet by mouth at bedtime.       . metoprolol succinate (TOPROL-XL) 100 MG 24 hr tablet TAKE 1 TABLET ONCE DAILY. 30 tablet 5  . Multiple Vitamins-Iron (MULTIVITAMINS WITH IRON) TABS Take 1 tablet by mouth 2 (two) times daily.     [name] omega-3 acid ethyl esters (LOVAZA) 1 G capsule Take 1 g by mouth daily.     [name] omeprazole (PRILOSEC) 20 MG capsule Take 20 mg by mouth daily.     Glory Rosebush DELICA LANCETS FINE MISC 1 Units by Does not apply route as directed. 100 each 3  . PARoxetine (PAXIL) 10 MG tablet Take 25 mg by mouth daily.     . quinapril (ACCUPRIL) 40 MG tablet TAKE 1 TABLET ONCE DAILY. 30 tablet 5  . senna (SENOKOT) 8.6 MG TABS Take 1 tablet by mouth daily.     . traMADol (ULTRAM) 50 MG tablet Take 1 tablet (50 mg total) by mouth every 6 (six) hours as needed. 1 q 8 hrs prn 30 tablet 1  . XARELTO 20 MG TABS tablet TAKE 1 TABLET EACH DAY. 30 tablet 11  . metoprolol tartrate (LOPRESSOR) 25 MG tablet Take 1 Tablet AS NEEDED for Irregular Heart Rate 15 tablet 1   No current facility-administered medications for this visit.    Allergies  Allergen Reactions  . Benadryl [Diphenhydramine Hcl] Other (See Comments)    Restless leg  . Clopidogrel Bisulfate     Nausea & pain  . Contrast Media [Iodinated Diagnostic Agents] Palpitations    Rapid heart rate, hot    History   Social History  . Marital Status: Married    Spouse Name: N/A    Number of Children: 2  . Years of Education: master's   Occupational History  . Speech Pathologist    Social History Main Topics  . Smoking status: Former Smoker -- 1.50 packs/day for 30 years    Types: Cigarettes    Quit date: 08/13/1993  . Smokeless tobacco: Never Used  . Alcohol Use: No  . Drug Use: No  . Sexual Activity: Not on file   Other Topics Concern  . Not on file   Social History Narrative     Review of Systems: General: negative for chills, fever, night sweats or weight changes.  Cardiovascular: negative for chest pain, dyspnea on exertion, edema, orthopnea,  palpitations, paroxysmal nocturnal dyspnea or shortness of breath Dermatological: negative for rash Respiratory: negative for cough or wheezing Urologic: negative for hematuria Abdominal: negative for nausea, vomiting, diarrhea, bright red blood per rectum, melena, or hematemesis Neurologic: negative for visual changes, syncope, or dizziness All other systems reviewed and are otherwise negative except as noted above.    Blood pressure 130/60, pulse 68, height 5\' 2"  (1.575 m), weight 196 lb 12.8 oz (89.268 kg).  General appearance: alert and no distress Neck: no adenopathy, no carotid bruit, no JVD, supple, symmetrical, trachea midline and thyroid not enlarged, symmetric, no tenderness/mass/nodules  Lungs: clear to auscultation bilaterally Heart: regular rate and rhythm, S1, S2 normal, no murmur, click, rub or gallop Extremities: extremities normal, atraumatic, no cyanosis or edema  EKG not performed today  ASSESSMENT AND PLAN:   CAD (coronary artery disease), hx of LCX stent DES 2009,  last cath 2012 50-60% RCA stenosis, patent stent, 30 % LM. History of CAD status post distal circumflex posterolateral branch stenting by Dr. Melvern Banker 10/14/9 some residual disease in the PDA branch. She had a Myoview stress test performed 05/06/08 which is nonischemic. Because of recurrent chest pain I cathed her again in 04/20/11 revealing a patent stent with 50% PDA lesion and normal LV function. She denies chest pain or shortness of breath.  Essential hypertension History of hypertension blood pressure today measured at 130/60. She is on quinapril, metoprolol and amlodipine. Continue current medications at current dosing  Hyperlipidemia History of hyperlipidemia on Crestor 10 mg a day. Recent cholesterol performed 04/01/14 was 135 total, LDL 61 and HDL of 50. Continue current medications at current dosing  Atrial fibrillation with rapid ventricular response History of PAF status post DC cardioversion by Dr.  [name] while she was an inpatient and most recently by Dr. [name] in September when she presented to the emergency room. She is currently in sinus rhythm. She is on Zaroxolyn oral anticoagulation. If she has recurrent episodes over for her to Dr. Rayann Heman for further evaluation and potential initiation of antiarrhythmic medication.      Lorretta Harp MD FACP,FACC,FAHA, [facility] 06/29/2014 12:18 PM

## 2014-06-29 NOTE — Patient Instructions (Signed)
Dr. Gwenlyn Found has ordered that you take Metoprolol Tartrate 25MG  AS NEEDED for irregular heart rate.  We request that you follow-up in: 6 months with Tarri Fuller, PA-C and in 1 year with Dr Andria Rhein will receive a reminder letter in the mail two months in advance. If you don't receive a letter, please call our office to schedule the follow-up appointment.

## 2014-06-29 NOTE — Assessment & Plan Note (Signed)
History of PAF status post DC cardioversion by Dr. [name] while she was an inpatient and most recently by Dr. [name] in September when she presented to the emergency room. She is currently in sinus rhythm. She is on Zaroxolyn oral anticoagulation. If she has recurrent episodes over for her to Dr. Rayann Heman for further evaluation and potential initiation of antiarrhythmic medication.

## 2014-06-29 NOTE — Assessment & Plan Note (Signed)
History of hyperlipidemia on Crestor 10 mg a day. Recent cholesterol performed 04/01/14 was 135 total, LDL 61 and HDL of 50. Continue current medications at current dosing

## 2014-06-29 NOTE — Assessment & Plan Note (Signed)
History of CAD status post distal circumflex posterolateral branch stenting by Dr. Melvern Banker 10/14/9 some residual disease in the PDA branch. She had a Myoview stress test performed 05/06/08 which is nonischemic. Because of recurrent chest pain I cathed her again in 04/20/11 revealing a patent stent with 50% PDA lesion and normal LV function. She denies chest pain or shortness of breath.

## 2014-06-29 NOTE — Assessment & Plan Note (Signed)
History of hypertension blood pressure today measured at 130/60. She is on quinapril, metoprolol and amlodipine. Continue current medications at current dosing

## 2014-06-30 ENCOUNTER — Ambulatory Visit
Admission: RE | Admit: 2014-06-30 | Discharge: 2014-06-30 | Disposition: A | Discharge status: Home / Self Care | Payer: Medicare Other | Source: Ambulatory Visit | Attending: Gastroenterology | Admitting: Gastroenterology

## 2014-06-30 DIAGNOSIS — R1310 Dysphagia, unspecified: Secondary | ICD-10-CM

## 2014-07-05 ENCOUNTER — Other Ambulatory Visit: Payer: Self-pay

## 2014-07-05 MED ORDER — ISOSORBIDE MONONITRATE ER 30 MG PO TB24: 30.0000 mg | ORAL_TABLET | Freq: Every day | ORAL | Status: DC

## 2014-07-05 NOTE — Telephone Encounter (Signed)
Rx sent to pharmacy   

## 2014-07-22 ENCOUNTER — Telehealth: Payer: Self-pay | Admitting: *Deleted

## 2014-07-22 NOTE — Telephone Encounter (Signed)
Dr. Cristina Gong is asking if this patient can stop his Xarelto the night before his procedure. The patient is scheduled for a colonoscopy/endoscopy on 09/08/2014.

## 2014-07-22 NOTE — Telephone Encounter (Signed)
OK to stop Xarelto night before GI procedure  JJB

## 2014-07-28 NOTE — Telephone Encounter (Signed)
Form signed and faxed to [facility]

## 2014-08-11 ENCOUNTER — Ambulatory Visit (INDEPENDENT_AMBULATORY_CARE_PROVIDER_SITE_OTHER): Payer: BC Managed Care – PPO | Admitting: Emergency Medicine

## 2014-08-11 VITALS — BP 124/65 | HR 62 | Temp 97.8°F | Resp 16 | Ht 62.5 in | Wt 198.0 lb

## 2014-08-11 DIAGNOSIS — J209 Acute bronchitis, unspecified: Secondary | ICD-10-CM

## 2014-08-11 MED ORDER — CLARITHROMYCIN 500 MG PO TABS: 500.0000 mg | ORAL_TABLET | Freq: Two times a day (BID) | ORAL | Status: DC

## 2014-08-11 NOTE — Patient Instructions (Signed)

## 2014-08-11 NOTE — Progress Notes (Signed)
[facility] [address] [place] [address] [phone]  Date:  08/11/2014   Name:  [name]   DOB:  1951/02/21   MRN:  [id number]  PCP:  Unice Cobble, MD    Chief Complaint: Cough and Nasal Congestion   History of Present Illness:  [name] is a 63 y.o. very pleasant female patient who presents with the following:  Ill for three days with cough productive of brown purulent sputum Has asthma with little wheezing but exertional shortness of breath Has right pleuritic chest pain. No nasal congestion or drainage No sore throat No nausea or vomiting.  No stool change Fever no chills No improvement with over the counter medications or other home remedies. Denies other complaint or health concern today.   Patient Active Problem List   Diagnosis Date Noted  . Atrial fibrillation with RVR 12/16/2013  . Atrial fibrillation with rapid ventricular response 12/16/2013  . Angina decubitus 04/28/2013  . CAD (coronary artery disease), hx of LCX stent DES 2009,  last cath 2012 50-60% RCA stenosis, patent stent, 30 % LM. 04/28/2013  . Essential hypertension 05/06/2009  . LOW BACK PAIN, CHRONIC 02/12/2008  . UNSPECIFIED ANEMIA 08/15/2007  . GOITER 01/02/2007  . Type II or unspecified type diabetes mellitus with peripheral circulatory disorders, uncontrolled(250.72) 01/02/2007  . Hyperlipidemia 01/02/2007  . ELEVATION, TRANSAMINASE/LDH LEVELS 01/02/2007    Past Medical History  Diagnosis Date  . Arthritis   . Asthma   . GERD (gastroesophageal reflux disease)   . Heart murmur   . Hyperlipidemia   . Hypertension   . Pneumonia 2008  . Goiter   . Diabetes mellitus     Type 2  . Anemia, unspecified   . Chronic low back pain   . Polycystic ovary disease     Hysterectomy in 1982 for this  . Bell's palsy     Facial nerve decompression in 2001  . Shortness of breath dyspnea     ECHO 02/26/08 showed only minor abnormalities  . Coronary artery  disease     Myoview 04/12/11 was entirely normal. ECHO 02/26/08 showed only minor abnormalities. Stenting 05/26/08 of her posterolateral branch to the left circumflex coronary artery. Used a 2.5x17mm Taxus Monorail stent.myoview 2014 was without ischemia  . COPD with asthma   . Glaucoma   . Spinal stenosis   . History of nuclear stress test 2012; 2014    lexiscan; normal pattern of perfusion; normal, low risk scan   . Atrial fibrillation with RVR     a. on Xarelto    Past Surgical History  Procedure Laterality Date  . Abdominal hysterectomy  1982    & BSO; for polycystic ovary disease  . Facial nerve decompression  2001/2002    bells palsy   . Laparoscopic cholecystectomy  06/15/2011     Dr Dalbert Batman  . Colonoscopy      last 2009; Dr Cristina Gong; due 2019  . Upper gi endoscopy  2009    negative  . Coronary angioplasty  05/26/2008    Stenting of her posterolateral branch to the left circumflex coronary artery. Used a 2.5x21mm Taxus Monorail stent.  . Transthoracic echocardiogram  1/91/4782    LV systolic function normal with mild conc LVH; LA mildly dilated; trace MR/TR  . Tee without cardioversion N/A 12/17/2013    Procedure: TRANSESOPHAGEAL ECHOCARDIOGRAM (TEE);  Surgeon: Pixie Casino, MD;  Location: [facility] ENDOSCOPY;  Service: Cardiovascular;  Laterality: N/A;  trish/ja  .  Cardioversion N/A 12/17/2013    Procedure: CARDIOVERSION;  Surgeon: Pixie Casino, MD;  Location: [facility] ENDOSCOPY;  Service: Cardiovascular;  Laterality: N/A;    History  Substance Use Topics  . Smoking status: Former Smoker -- 1.50 packs/day for 30 years    Types: Cigarettes    Quit date: 08/13/1993  . Smokeless tobacco: Never Used  . Alcohol Use: No    Family History  Problem Relation Age of Onset  . Heart attack Father 64    2nd MI at 74  . Colon cancer Brother 74  . Gout Brother   . Diabetes Neg Hx   . Stroke Neg Hx   . Ulcers Mother   . Emphysema Mother 69  . Colon polyps Sister   . Cancer Sister      Basal cell carcinoma  . Pneumonia Maternal Grandmother   . Hypertension Brother   . Hyperlipidemia Brother   . Cancer Brother     Skin    Allergies  Allergen Reactions  . Benadryl [Diphenhydramine Hcl] Other (See Comments)    Restless leg  . Clopidogrel Bisulfate     Nausea & pain  . Contrast Media [Iodinated Diagnostic Agents] Palpitations    Rapid heart rate, hot    Medication list has been reviewed and updated.  Current Outpatient Prescriptions on File Prior to Visit  Medication Sig Dispense Refill  . amLODipine (NORVASC) 10 MG tablet TAKE (1) TABLET DAILY FOR HIGH BLOOD PRESSURE. 30 tablet 5  . ascorbic acid (VITAMIN C) 1000 MG tablet Take 1,000 mg by mouth 2 (two) times daily.     . budesonide-formoterol (SYMBICORT) 160-4.5 MCG/ACT inhaler Inhale 2 puffs into the lungs 2 (two) times daily. 1 Inhaler 3  . CALCIUM-MAG-VIT C-VIT D PO Take 2 tablets by mouth daily.     . celecoxib (CELEBREX) 200 MG capsule Take 1 capsule by mouth 2 (two) times daily.    . clobetasol cream (TEMOVATE) 9.48 % Apply 1 application topically daily.    . CRESTOR 10 MG tablet TAKE ONE TABLET AT BEDTIME. 30 tablet 6  . estradiol (ESTRACE) 0.5 MG tablet Take 0.5 mg by mouth daily.     [name] guaiFENesin (MUCINEX) 600 MG 12 hr tablet Take 600 mg by mouth 2 (two) times daily.    . INVOKANA 100 MG TABS TAKE 1 TABLET ONCE DAILY. 30 tablet 5  . isosorbide mononitrate (IMDUR) 30 MG 24 hr tablet Take 1 tablet (30 mg total) by mouth daily. 30 tablet 10  . JANUMET 50-1000 MG per tablet TAKE 1/2 TABLET IN THE MORNING WITH BREAKFAST AND 1 TABLET IN THE EVENING WITH SUPPER. 45 tablet 5  . latanoprost (XALATAN) 0.005 % ophthalmic solution Place 1 drop into both eyes at bedtime.    . levalbuterol (XOPENEX HFA) 45 MCG/ACT inhaler Inhale 1-2 puffs into the lungs every 6 (six) hours as needed for wheezing. 1 Inhaler 1  . levalbuterol (XOPENEX) 0.63 MG/3ML nebulizer solution Take 3 mLs (0.63 mg total) by nebulization every 6  (six) hours as needed for wheezing or shortness of breath. 75 mL 1  . LORazepam (ATIVAN) 0.5 MG tablet Take 1 mg by mouth at bedtime.     . Melatonin 5 MG TABS Take 1 tablet by mouth at bedtime.     . metoprolol succinate (TOPROL-XL) 100 MG 24 hr tablet TAKE 1 TABLET ONCE DAILY. 30 tablet 5  . metoprolol tartrate (LOPRESSOR) 25 MG tablet Take 1 Tablet AS NEEDED for Irregular Heart Rate 15 tablet 1  .  Multiple Vitamins-Iron (MULTIVITAMINS WITH IRON) TABS Take 1 tablet by mouth 2 (two) times daily.     [name] omega-3 acid ethyl esters (LOVAZA) 1 G capsule Take 1 g by mouth daily.     [name] omeprazole (PRILOSEC) 20 MG capsule Take 20 mg by mouth daily.     Glory Rosebush DELICA LANCETS FINE MISC 1 Units by Does not apply route as directed. 100 each 3  . PARoxetine (PAXIL) 10 MG tablet Take 25 mg by mouth daily.     . quinapril (ACCUPRIL) 40 MG tablet TAKE 1 TABLET ONCE DAILY. 30 tablet 5  . senna (SENOKOT) 8.6 MG TABS Take 1 tablet by mouth daily.     . traMADol (ULTRAM) 50 MG tablet Take 1 tablet (50 mg total) by mouth every 6 (six) hours as needed. 1 q 8 hrs prn 30 tablet 1  . XARELTO 20 MG TABS tablet TAKE 1 TABLET EACH DAY. 30 tablet 11   No current facility-administered medications on file prior to visit.    Review of Systems:  As per HPI, otherwise negative.    Physical Examination: Filed Vitals:   08/11/14 1855  BP: 124/65  Pulse: 62  Temp: 97.8 F (36.6 C)  Resp: 16   Filed Vitals:   08/11/14 1855  Height: 5' 2.5" (1.588 m)  Weight: 198 lb (89.812 kg)   Body mass index is 35.62 kg/(m^2). Ideal Body Weight: Weight in (lb) to have BMI = 25: 138.6  GEN: WDWN, NAD, Non-toxic, A & O x 3 HEENT: Atraumatic, Normocephalic. Neck supple. No masses, No LAD. Ears and Nose: No external deformity. CV: RRR, No M/G/R. No JVD. No thrill. No extra heart sounds. PULM: CTA B, no wheezes, crackles, rhonchi. No retractions. No resp. distress. No accessory muscle use. ABD: S, NT, ND, +BS. No rebound.  No HSM. EXTR: No c/c/e NEURO Normal gait.  PSYCH: Normally interactive. Conversant. Not depressed or anxious appearing.  Calm demeanor.    Assessment and Plan: Bronchitis tussionex biaxin  Signed,  Ellison Carwin, MD

## 2014-08-12 ENCOUNTER — Other Ambulatory Visit: Payer: Self-pay | Admitting: Family Medicine

## 2014-08-12 MED ORDER — HYDROCOD POLST-CHLORPHEN POLST 10-8 MG/5ML PO LQCR: 5.0000 mL | Freq: Two times a day (BID) | ORAL | Status: DC | PRN

## 2014-08-12 MED ORDER — DOXYCYCLINE HYCLATE 100 MG PO CAPS: 100.0000 mg | ORAL_CAPSULE | Freq: Two times a day (BID) | ORAL | Status: DC

## 2014-08-17 ENCOUNTER — Other Ambulatory Visit: Payer: Self-pay | Admitting: Internal Medicine

## 2014-08-30 ENCOUNTER — Ambulatory Visit: Payer: BC Managed Care – PPO | Admitting: Neurology

## 2014-09-05 ENCOUNTER — Other Ambulatory Visit: Payer: Self-pay | Admitting: Cardiovascular Disease

## 2014-09-08 ENCOUNTER — Other Ambulatory Visit: Payer: Self-pay | Admitting: Gastroenterology

## 2014-09-17 ENCOUNTER — Other Ambulatory Visit: Payer: Self-pay | Admitting: Internal Medicine

## 2014-10-10 ENCOUNTER — Encounter: Payer: Self-pay | Admitting: Internal Medicine

## 2014-10-10 DIAGNOSIS — D126 Benign neoplasm of colon, unspecified: Secondary | ICD-10-CM | POA: Insufficient documentation

## 2014-10-17 ENCOUNTER — Other Ambulatory Visit: Payer: Self-pay | Admitting: Internal Medicine

## 2014-10-22 ENCOUNTER — Telehealth: Payer: Self-pay

## 2014-10-25 NOTE — Telephone Encounter (Signed)
Patient declined flu shot on 08/23/14.

## 2014-10-27 ENCOUNTER — Other Ambulatory Visit: Payer: Self-pay

## 2014-10-27 DIAGNOSIS — Z1231 Encounter for screening mammogram for malignant neoplasm of breast: Secondary | ICD-10-CM

## 2014-10-29 ENCOUNTER — Encounter: Payer: Self-pay | Admitting: Internal Medicine

## 2014-11-22 ENCOUNTER — Other Ambulatory Visit: Payer: Self-pay | Admitting: Internal Medicine

## 2014-12-10 ENCOUNTER — Ambulatory Visit: Payer: Self-pay

## 2014-12-17 ENCOUNTER — Other Ambulatory Visit: Payer: Self-pay | Admitting: Internal Medicine

## 2014-12-24 ENCOUNTER — Ambulatory Visit (INDEPENDENT_AMBULATORY_CARE_PROVIDER_SITE_OTHER): Payer: Medicare Other | Admitting: Internal Medicine

## 2014-12-24 ENCOUNTER — Encounter: Payer: Self-pay | Admitting: Internal Medicine

## 2014-12-24 ENCOUNTER — Other Ambulatory Visit (INDEPENDENT_AMBULATORY_CARE_PROVIDER_SITE_OTHER): Payer: Medicare Other

## 2014-12-24 VITALS — BP 118/50 | HR 58 | Temp 97.5°F | Ht 62.0 in | Wt 193.0 lb

## 2014-12-24 DIAGNOSIS — E785 Hyperlipidemia, unspecified: Secondary | ICD-10-CM

## 2014-12-24 DIAGNOSIS — I10 Essential (primary) hypertension: Secondary | ICD-10-CM

## 2014-12-24 DIAGNOSIS — E1151 Type 2 diabetes mellitus with diabetic peripheral angiopathy without gangrene: Secondary | ICD-10-CM

## 2014-12-24 DIAGNOSIS — D126 Benign neoplasm of colon, unspecified: Secondary | ICD-10-CM | POA: Diagnosis not present

## 2014-12-24 DIAGNOSIS — R6889 Other general symptoms and signs: Secondary | ICD-10-CM

## 2014-12-24 DIAGNOSIS — E1159 Type 2 diabetes mellitus with other circulatory complications: Secondary | ICD-10-CM

## 2014-12-24 LAB — CBC WITH DIFFERENTIAL/PLATELET
Basophils Absolute: 0.1 10*3/uL (ref 0.0–0.1)
Basophils Relative: 0.6 % (ref 0.0–3.0)
Eosinophils Absolute: 0.3 10*3/uL (ref 0.0–0.7)
Eosinophils Relative: 3.3 % (ref 0.0–5.0)
HCT: 34.6 % — ABNORMAL LOW (ref 36.0–46.0)
Hemoglobin: 11.5 g/dL — ABNORMAL LOW (ref 12.0–15.0)
Lymphocytes Relative: 18.1 % (ref 12.0–46.0)
Lymphs Abs: 1.5 10*3/uL (ref 0.7–4.0)
MCHC: 33.1 g/dL (ref 30.0–36.0)
MCV: 81.2 fl (ref 78.0–100.0)
Monocytes Absolute: 0.6 10*3/uL (ref 0.1–1.0)
Monocytes Relative: 6.8 % (ref 3.0–12.0)
Neutro Abs: 5.9 10*3/uL (ref 1.4–7.7)
Neutrophils Relative %: 71.2 % (ref 43.0–77.0)
Platelets: 316 10*3/uL (ref 150.0–400.0)
RBC: 4.27 Mil/uL (ref 3.87–5.11)
RDW: 13.9 % (ref 11.5–15.5)
WBC: 8.3 10*3/uL (ref 4.0–10.5)

## 2014-12-24 LAB — LIPID PANEL
Cholesterol: 143 mg/dL (ref 0–200)
HDL: 52.6 mg/dL (ref >39.00)
LDL Cholesterol: 66 mg/dL (ref 0–99)
NonHDL: 90.4
Total CHOL/HDL Ratio: 3
Triglycerides: 121 mg/dL (ref 0.0–149.0)
VLDL: 24.2 mg/dL (ref 0.0–40.0)

## 2014-12-24 LAB — HEMOGLOBIN A1C: Hgb A1c MFr Bld: 6.8 % — ABNORMAL HIGH (ref 4.6–6.5)

## 2014-12-24 LAB — MICROALBUMIN / CREATININE URINE RATIO
Creatinine,U: 35 mg/dL
Microalb Creat Ratio: 16.8 mg/g (ref 0.0–30.0)
Microalb, Ur: 5.9 mg/dL — ABNORMAL HIGH (ref 0.0–1.9)

## 2014-12-24 LAB — HEPATIC FUNCTION PANEL
ALT: 19 U/L (ref 0–35)
AST: 25 U/L (ref 0–37)
Albumin: 4 g/dL (ref 3.5–5.2)
Alkaline Phosphatase: 58 U/L (ref 39–117)
Bilirubin, Direct: 0 mg/dL (ref 0.0–0.3)
Total Bilirubin: 0.3 mg/dL (ref 0.2–1.2)
Total Protein: 7.3 g/dL (ref 6.0–8.3)

## 2014-12-24 LAB — BASIC METABOLIC PANEL
BUN: 15 mg/dL (ref 6–23)
CO2: 29 mEq/L (ref 19–32)
Calcium: 9.4 mg/dL (ref 8.4–10.5)
Chloride: 99 mEq/L (ref 96–112)
Creatinine, Ser: 0.72 mg/dL (ref 0.40–1.20)
GFR: 86.09 mL/min (ref >60.00)
Glucose, Bld: 170 mg/dL — ABNORMAL HIGH (ref 70–99)
Potassium: 4.2 mEq/L (ref 3.5–5.1)
Sodium: 133 mEq/L — ABNORMAL LOW (ref 135–145)

## 2014-12-24 LAB — TSH: TSH: 1.95 u[IU]/mL (ref 0.35–4.50)

## 2014-12-24 NOTE — Assessment & Plan Note (Signed)
Lipids, LFTs, TSH  

## 2014-12-24 NOTE — Assessment & Plan Note (Signed)
Blood pressure goals reviewed. BMET 

## 2014-12-24 NOTE — Assessment & Plan Note (Signed)
A1c , urine microalbumin, BMET 

## 2014-12-24 NOTE — Progress Notes (Signed)
Pre visit review using our clinic review tool, if applicable. No additional management support is needed unless otherwise documented below in the visit note. 

## 2014-12-24 NOTE — Patient Instructions (Signed)
  Your next office appointment will be determined based upon review of your pending labs . Those instructions will be transmitted to you by My Chart Critical results will be called.  Followup as needed for any active or acute issue. Please report any significant change in your symptoms.  Minimal Blood Pressure Goal= AVERAGE < 140/90;  Ideal is an AVERAGE < 135/85. This AVERAGE should be calculated from @ least 5-7 BP readings taken @ different times of day on different days of week. You should not respond to isolated BP readings , but rather the AVERAGE for that week .Please bring your  blood pressure cuff to office visits to verify that it is reliable.It  can also be checked against the blood pressure device at the pharmacy. Finger or wrist cuffs are not dependable; an arm cuff is.

## 2014-12-24 NOTE — Progress Notes (Signed)
   Subjective:    Patient ID: [name], female    DOB: 02-27-1951, 64 y.o.   MRN: [id number]  HPI She is here to assess status of active health conditions:  Diet/ nutrition: No specific diet Exercise program: Walking is limited by spondylosis; she will walk 2 blocks 4 times a week. She does have exertional dyspnea climbing stairs but not on level ground.  HYPERTENSION: Disease Monitoring: Blood pressure range/ average : She only checks blood pressure "if I feel bad" Medication Compliance: Yes  Diabetes :  FBS range/average: No recordings provided Highest 2 hr post meal glucose: Medication compliance: Yes Hypoglycemia: No Ophthamology care: Up-to-date Podiatry care: Not up-to-date  HYPERLIPIDEMIA: Disease Monitoring: Medication Compliance: Yes  She describes being cool, i.e. cold intolerance.     Review of Systems  Chest pain, palpitations: No      Dyspnea: As noted Edema: No Claudication: No Lightheadedness,Syncope: No Weight gain/loss: 30 pound weight loss over the last year in the context of Invokana therapy Polyuria/phagia/dipsia: No     Blurred vision /diplopia/lossof vision: No Limb numbness/tingling/burning: No Non healing skin lesions: No Abd pain, bowel changes: Abdominal pain evaluated by Dr.Buccini. Upper and lower endoscopies done January 2016. Apparently she had 2 small polyps. She also was found to have possible arteriovenous malformations  Myalgias: No  Memory loss:No      Objective:   Physical Exam  Pertinent or positive findings include: BMI:35.29 There is  a grade 1 systolic murmur heard only when supine. She has mild crepitus of the knees.  The great toenails are absent.  General appearance :adequately nourished; in no distress. Eyes: No conjunctival inflammation or scleral icterus is present. Oral exam:  Lips and gums are healthy appearing.There is no oropharyngeal erythema or exudate noted. Dental hygiene is good. Heart:  Normal rate  and regular rhythm. S1 and S2 normal without gallop, click, rub or other extra sounds   Lungs:Chest clear to auscultation; no wheezes, rhonchi,rales ,or rubs present.No increased work of breathing.  Abdomen: bowel sounds normal, soft and non-tender without masses, organomegaly or hernias noted.  No guarding or rebound. No flank tenderness to percussion. Vascular : all pulses equal ; no bruits present. Skin:Warm & dry.  Intact without suspicious lesions or rashes ; no tenting or jaundice  Lymphatic: No lymphadenopathy is noted about the head, neck, axilla Neuro: Strength, tone & DTRs normal.        Assessment & Plan:  See Current Assessment & Plan in Problem List under specific Diagnosis

## 2014-12-24 NOTE — Assessment & Plan Note (Signed)
CBC

## 2014-12-28 ENCOUNTER — Telehealth: Payer: Self-pay

## 2014-12-28 NOTE — Telephone Encounter (Signed)
Faxed office visit and labs per dr hoppers request to dr buccini/gi at [phone]

## 2014-12-28 NOTE — Telephone Encounter (Signed)
-----   Message from Hendricks Limes, MD sent at 12/26/2014  9:57 AM EDT ----- Please FAX office visit & lab results to Dr Cristina Gong, GI

## 2014-12-28 NOTE — Telephone Encounter (Signed)
Faxed to dr buccini/gi per dr hoppers request

## 2015-01-01 ENCOUNTER — Other Ambulatory Visit: Payer: Self-pay | Admitting: Cardiovascular Disease

## 2015-01-28 ENCOUNTER — Telehealth: Payer: Self-pay | Admitting: Internal Medicine

## 2015-01-28 NOTE — Telephone Encounter (Signed)
She said that gout runs in family she THINKS she has gout.  Explain to her that she will have to come into visit Monday and will be sent to lab if blood work is ordered and needs to be done.  She said ok

## 2015-01-28 NOTE — Telephone Encounter (Signed)
Please advise 

## 2015-01-28 NOTE — Telephone Encounter (Signed)
?   Gout If so uric acid level If goiter; no tests as TSH good in 5/16 Thanks,Hopp

## 2015-01-28 NOTE — Telephone Encounter (Signed)
Pt called in and said that she has an appt on Monday with Hop for her Ghout and wants to know if she can come in today and do blood work for her [place]?

## 2015-01-31 ENCOUNTER — Encounter: Payer: Self-pay | Admitting: Internal Medicine

## 2015-01-31 ENCOUNTER — Ambulatory Visit (INDEPENDENT_AMBULATORY_CARE_PROVIDER_SITE_OTHER): Payer: Medicare Other | Admitting: Internal Medicine

## 2015-01-31 ENCOUNTER — Other Ambulatory Visit (INDEPENDENT_AMBULATORY_CARE_PROVIDER_SITE_OTHER): Payer: Medicare Other

## 2015-01-31 VITALS — BP 148/40 | HR 75 | Temp 97.9°F | Resp 16 | Wt 194.0 lb

## 2015-01-31 DIAGNOSIS — M79675 Pain in left toe(s): Secondary | ICD-10-CM

## 2015-01-31 DIAGNOSIS — M5416 Radiculopathy, lumbar region: Secondary | ICD-10-CM | POA: Diagnosis not present

## 2015-01-31 LAB — URIC ACID: Uric Acid, Serum: 5.2 mg/dL (ref 2.4–7.0)

## 2015-01-31 MED ORDER — GABAPENTIN 100 MG PO CAPS: ORAL_CAPSULE | ORAL | Status: DC

## 2015-01-31 NOTE — Progress Notes (Signed)
Pre visit review using our clinic review tool, if applicable. No additional management support is needed unless otherwise documented below in the visit note. 

## 2015-01-31 NOTE — Progress Notes (Signed)
   Subjective:    Patient ID: [name], female    DOB: [birth year], 64 y.o.   MRN: [id number]  HPI  She describes intermittent electric like pain from the ankle down the dorsum of the left foot into the great toe laterally. Initially it lasted seconds; last week it was more of a constant phenomenon with aching. She describes some numbness in that toe as well. She may or may not of had some redness or swelling of the joints. She has a past history of fracture of the metatarsals with osteoarthritis.  She was concerned she might have gout as her brother & nephew have it. She is not having any pain in the foot at this time new. She also has a history of spinal stenosis  Review of Systems She denies tingling or weakness in the foot. There is no gait or balance dysfunction. She also has no incontinence of urine or bowels.    Objective:   Physical Exam  Pertinent or positive findings include: She has a grade 1 systolic murmur with radiation into the carotids. The great toenails are surgically absent.  General appearance :adequately nourished; in no distress.  Eyes: No conjunctival inflammation or scleral icterus is present.  Heart:  Normal rate and regular rhythm. S1 and S2 normal without gallop, click, rub or other extra sounds    Lungs:Chest clear to auscultation; no wheezes, rhonchi,rales ,or rubs present.No increased work of breathing.   Vascular : all pulses equal ; no bruits present.  Skin:Warm & dry.  Intact without suspicious lesions or rashes ; no tenting or jaundice   Lymphatic: No lymphadenopathy is noted about the head, neck, axilla   Neuro: Strength, tone & DTRs normal.        Assessment & Plan:  #1 radicular pain suggesting L5 or common peroneal nerve etiology. Gout clinically does not appear to be present.  Plan: Trial of gabapentin Uric acid level

## 2015-01-31 NOTE — Patient Instructions (Signed)
  Your next office appointment will be determined based upon review of your pending labs  and  xrays  Those written interpretation of the lab results and instructions will be transmitted to you by My Chart   Critical results will be called.   Followup as needed for any active or acute issue. Please report any significant change in your symptoms. 

## 2015-02-07 ENCOUNTER — Other Ambulatory Visit: Payer: Self-pay

## 2015-02-09 ENCOUNTER — Ambulatory Visit (INDEPENDENT_AMBULATORY_CARE_PROVIDER_SITE_OTHER): Payer: Medicare Other | Admitting: *Deleted

## 2015-02-09 VITALS — BP 128/68 | HR 101

## 2015-02-09 DIAGNOSIS — I4891 Unspecified atrial fibrillation: Secondary | ICD-10-CM | POA: Diagnosis not present

## 2015-02-09 MED ORDER — METOPROLOL SUCCINATE ER 50 MG PO TB24: 150.0000 mg | ORAL_TABLET | Freq: Every day | ORAL | Status: DC

## 2015-02-09 NOTE — Patient Instructions (Signed)
Increase Metoprolol succ from 100mg  daily to 150mg  daily. Follow up with [facility] clinic. We will submit referral for you to be seen.

## 2015-02-09 NOTE — Progress Notes (Signed)
Patient c/o A Fib symptoms x 1 week, was set up for nurse visit by scheduler. Presented here, EKG performed by this nurse. EKG interpretation by Dr. [name] ([facility])  Pt had 2 cardioversions in 2015, problem free for A Fib recurrence since November 2015 until last week she noted symptoms again. Recommendation  By Dr. [name] to increase daily metoprolol succ from 100mg  to 150mg , refer pt to [facility] Fib clinic for further workup.

## 2015-02-10 ENCOUNTER — Other Ambulatory Visit: Payer: Self-pay | Admitting: Internal Medicine

## 2015-02-10 NOTE — Telephone Encounter (Signed)
janumet and acupril rx sent to pharm

## 2015-02-18 ENCOUNTER — Ambulatory Visit (HOSPITAL_COMMUNITY)
Admission: RE | Admit: 2015-02-18 | Discharge: 2015-02-18 | Disposition: A | Discharge status: Home / Self Care | Payer: Medicare Other | Source: Ambulatory Visit | Attending: Nurse Practitioner | Admitting: Nurse Practitioner

## 2015-02-18 ENCOUNTER — Encounter (HOSPITAL_COMMUNITY): Payer: Self-pay | Admitting: Nurse Practitioner

## 2015-02-18 DIAGNOSIS — J45909 Unspecified asthma, uncomplicated: Secondary | ICD-10-CM | POA: Diagnosis not present

## 2015-02-18 DIAGNOSIS — Z87891 Personal history of nicotine dependence: Secondary | ICD-10-CM | POA: Insufficient documentation

## 2015-02-18 DIAGNOSIS — J449 Chronic obstructive pulmonary disease, unspecified: Secondary | ICD-10-CM | POA: Diagnosis not present

## 2015-02-18 DIAGNOSIS — K219 Gastro-esophageal reflux disease without esophagitis: Secondary | ICD-10-CM | POA: Insufficient documentation

## 2015-02-18 DIAGNOSIS — I2510 Atherosclerotic heart disease of native coronary artery without angina pectoris: Secondary | ICD-10-CM | POA: Diagnosis not present

## 2015-02-18 DIAGNOSIS — I480 Paroxysmal atrial fibrillation: Secondary | ICD-10-CM | POA: Insufficient documentation

## 2015-02-18 DIAGNOSIS — Z79899 Other long term (current) drug therapy: Secondary | ICD-10-CM | POA: Diagnosis not present

## 2015-02-18 DIAGNOSIS — E119 Type 2 diabetes mellitus without complications: Secondary | ICD-10-CM | POA: Diagnosis not present

## 2015-02-18 DIAGNOSIS — I10 Essential (primary) hypertension: Secondary | ICD-10-CM | POA: Diagnosis not present

## 2015-02-18 DIAGNOSIS — R0683 Snoring: Secondary | ICD-10-CM | POA: Insufficient documentation

## 2015-02-18 DIAGNOSIS — E785 Hyperlipidemia, unspecified: Secondary | ICD-10-CM | POA: Diagnosis not present

## 2015-02-18 DIAGNOSIS — Z7902 Long term (current) use of antithrombotics/antiplatelets: Secondary | ICD-10-CM | POA: Diagnosis not present

## 2015-02-18 NOTE — Patient Instructions (Signed)
afib clinic as needed  Scheduler will be in contact with you regarding sleep study.

## 2015-02-18 NOTE — Progress Notes (Signed)
Patient ID: [name], female   DOB: 22-Mar-1951, 64 y.o.   MRN: [id number]     Primary Care Physician: Unice Cobble, MD Referring Physician: Dr. Ronald Lobo [name] is a 64 y.o. female with a h/o CAD, DM, HTN, obesity, PAF first diagnosed May of 2015 with successful DCCV x2, first in May 2015 and again in September 2016.She  Recently had an AF episode and metoprolol succinate was increased to 150 mg qd. She converted on this dose and has been maintaining SR.She was referred to afib clinic at the time of afib. She also has short acting metoprolol to take as well for break through afib. No previous AAD. Chads vasc score of at least 5, on xarelto daily.  We discussed lifestyle triggers. Pt does not  smoke or drink alcohol.Her BP/Hgba1c is controlled. She is sedentary. She does snore, no previous sleep study.  Today, she denies symptoms of palpitations, chest pain, shortness of breath, orthopnea, PND, lower extremity edema, dizziness, presyncope, syncope, or neurologic sequela. The patient is tolerating medications without difficulties and is otherwise without complaint today.   Past Medical History  Diagnosis Date  . Arthritis   . Asthma   . GERD (gastroesophageal reflux disease)   . Heart murmur   . Hyperlipidemia   . Hypertension   . Pneumonia 2008  . Goiter   . Diabetes mellitus     Type 2  . Anemia, unspecified   . Chronic low back pain   . Polycystic ovary disease     Hysterectomy in 1982 for this  . Bell's palsy     Facial nerve decompression in 2001  . Shortness of breath dyspnea     ECHO 02/26/08 showed only minor abnormalities  . Coronary artery disease     Myoview 04/12/11 was entirely normal. ECHO 02/26/08 showed only minor abnormalities. Stenting 05/26/08 of her posterolateral branch to the left circumflex coronary artery. Used a 2.5x80mm Taxus Monorail stent.myoview 2014 was without ischemia  . COPD with asthma   . Glaucoma   . Spinal stenosis   . History of  nuclear stress test 2012; 2014    lexiscan; normal pattern of perfusion; normal, low risk scan   . Atrial fibrillation with RVR     a. on Xarelto   Past Surgical History  Procedure Laterality Date  . Abdominal hysterectomy  1982    & BSO; for polycystic ovary disease  . Facial nerve decompression  2001/2002    bells palsy   . Laparoscopic cholecystectomy  06/15/2011     Dr Dalbert Batman  . Colonoscopy      last 2009; Dr Cristina Gong; due 2019  . Upper gi endoscopy  2009    negative  . Coronary angioplasty  05/26/2008    Stenting of her posterolateral branch to the left circumflex coronary artery. Used a 2.5x75mm Taxus Monorail stent.  . Transthoracic echocardiogram  6/96/7893    LV systolic function normal with mild conc LVH; LA mildly dilated; trace MR/TR  . Tee without cardioversion N/A 12/17/2013    Procedure: TRANSESOPHAGEAL ECHOCARDIOGRAM (TEE);  Surgeon: Pixie Casino, MD;  Location: [facility] ENDOSCOPY;  Service: Cardiovascular;  Laterality: N/A;  trish/ja  . Cardioversion N/A 12/17/2013    Procedure: CARDIOVERSION;  Surgeon: Pixie Casino, MD;  Location: [facility] ENDOSCOPY;  Service: Cardiovascular;  Laterality: N/A;    Current Outpatient Prescriptions  Medication Sig Dispense Refill  . amLODipine (NORVASC) 10 MG tablet TAKE (1) TABLET DAILY FOR HIGH BLOOD PRESSURE. 30 tablet  5  . ascorbic acid (VITAMIN C) 1000 MG tablet Take 1,000 mg by mouth 2 (two) times daily.     . budesonide-formoterol (SYMBICORT) 160-4.5 MCG/ACT inhaler Inhale 2 puffs into the lungs 2 (two) times daily. (Patient taking differently: Inhale 2 puffs into the lungs 2 (two) times daily as needed. ) 1 Inhaler 3  . CALCIUM-MAG-VIT C-VIT D PO Take 2 tablets by mouth daily.     . celecoxib (CELEBREX) 200 MG capsule Take 1 capsule by mouth 2 (two) times daily.    . CRESTOR 10 MG tablet TAKE ONE TABLET AT BEDTIME. 30 tablet 5  . estradiol (ESTRACE) 0.5 MG tablet Take 0.5 mg by mouth daily.     [name] gabapentin (NEURONTIN) 100 MG capsule  One pill every eight hours as needed; dose may be increased by one pill each dose after 72 hours if only partially effective 30 capsule 2  . guaiFENesin (MUCINEX) 600 MG 12 hr tablet Take 600 mg by mouth 2 (two) times daily.    . INVOKANA 100 MG TABS tablet TAKE 1 TABLET ONCE DAILY. 30 tablet 5  . isosorbide mononitrate (IMDUR) 30 MG 24 hr tablet Take 1 tablet (30 mg total) by mouth daily. 30 tablet 10  . JANUMET 50-1000 MG per tablet TAKE 1/2 TABLET IN THE MORNING WITH BREAKFAST AND 1 TABLET IN THE EVENING WITH SUPPER. 45 tablet 3  . latanoprost (XALATAN) 0.005 % ophthalmic solution Place 1 drop into both eyes at bedtime.    . levalbuterol (XOPENEX HFA) 45 MCG/ACT inhaler Inhale 1-2 puffs into the lungs every 6 (six) hours as needed for wheezing. 1 Inhaler 1  . levalbuterol (XOPENEX) 0.63 MG/3ML nebulizer solution Take 3 mLs (0.63 mg total) by nebulization every 6 (six) hours as needed for wheezing or shortness of breath. 75 mL 1  . LORazepam (ATIVAN) 0.5 MG tablet Take 1 mg by mouth at bedtime.     . Melatonin 5 MG TABS Take 1 tablet by mouth at bedtime.     . metoprolol succinate (TOPROL-XL) 50 MG 24 hr tablet Take 3 tablets (150 mg total) by mouth daily. Take with or immediately following a meal. 90 tablet 3  . metoprolol tartrate (LOPRESSOR) 25 MG tablet Take 1 Tablet AS NEEDED for Irregular Heart Rate 15 tablet 1  . Multiple Vitamins-Iron (MULTIVITAMINS WITH IRON) TABS Take 1 tablet by mouth 2 (two) times daily.     [name] omega-3 acid ethyl esters (LOVAZA) 1 G capsule Take 1 g by mouth daily.     [name] omeprazole (PRILOSEC) 20 MG capsule Take 20 mg by mouth daily.     [name] PARoxetine (PAXIL) 10 MG tablet Take 25 mg by mouth daily.     . quinapril (ACCUPRIL) 40 MG tablet TAKE 1 TABLET ONCE DAILY. 30 tablet 3  . rivaroxaban (XARELTO) 20 MG TABS tablet Take 1 tablet (20 mg total) by mouth daily with supper. 30 tablet 1  . senna (SENOKOT) 8.6 MG TABS Take 1 tablet by mouth daily.     . traMADol (ULTRAM) 50  MG tablet Take 1 tablet (50 mg total) by mouth every 6 (six) hours as needed. 1 q 8 hrs prn 30 tablet 1  . clobetasol cream (TEMOVATE) 1.09 % Apply 1 application topically daily.    Glory Rosebush DELICA LANCETS FINE MISC 1 Units by Does not apply route as directed. 100 each 3   No current facility-administered medications for this encounter.    Allergies  Allergen Reactions  . Benadryl [  Diphenhydramine Hcl] Other (See Comments)    Restless leg  . Clopidogrel Bisulfate     Nausea & pain  . Contrast Media [Iodinated Diagnostic Agents] Palpitations    Rapid heart rate, hot    History   Social History  . Marital Status: Married    Spouse Name: N/A  . Number of Children: 2  . Years of Education: master's   Occupational History  . Speech Pathologist    Social History Main Topics  . Smoking status: Former Smoker -- 1.50 packs/day for 30 years    Types: Cigarettes    Quit date: 08/13/1993  . Smokeless tobacco: Never Used  . Alcohol Use: No  . Drug Use: No  . Sexual Activity: Not on file   Other Topics Concern  . Not on file   Social History Narrative    Family History  Problem Relation Age of Onset  . Heart attack Father 58    2nd MI at 66  . Colon cancer Brother 21  . Gout Brother   . Diabetes Neg Hx   . Stroke Neg Hx   . Ulcers Mother   . Emphysema Mother 14  . Colon polyps Sister   . Cancer Sister     Basal cell carcinoma  . Pneumonia Maternal Grandmother   . Hypertension Brother   . Hyperlipidemia Brother   . Cancer Brother     Skin    ROS- All systems are reviewed and negative except as per the HPI above  Physical Exam: Filed Vitals:   02/18/15 0909  BP: 130/62  Pulse: 59  Height: 5\' 2"  (1.575 m)  Weight: 195 lb 3.2 oz (88.542 kg)    GEN- The patient is well appearing, alert and oriented x 3 today.   Head- normocephalic, atraumatic Eyes-  Sclera clear, conjunctiva pink Ears- hearing intact Oropharynx- clear Neck- supple, no JVP Lymph- no  cervical lymphadenopathy Lungs- Clear to ausculation bilaterally, normal work of breathing Heart- Regular rate and rhythm, no murmurs, rubs or gallops, PMI not laterally displaced GI- soft, NT, ND, + BS Extremities- no clubbing, cyanosis, or edema MS- no significant deformity or atrophy Skin- no rash or lesion Psych- euthymic mood, full affect Neuro- strength and sensation are intact  EKG- Sinus brady with first degree av block, septal infarct undetermined. Pr int 212 ms, QRS 72 ms, Qtc 423 ms.  Echo- TEE- 12/17/13- Left ventricle: The cavity size was normal. There was mild concentric hypertrophy. Systolic function was normal. The estimated ejection fraction was in the range of 55% to 60%. Wall motion was normal; there were no regional wall motion abnormalities. - Aortic valve: No evidence of vegetation. - Aorta: The aorta was normal, not dilated, and non-diseased. - Mitral valve: No evidence of vegetation. Trivial regurgitation. - Left atrium: No evidence of thrombus in the atrial cavity or appendage. - Right atrium: No evidence of thrombus in the atrial cavity or appendage. - Atrial septum: There was a patent foramen ovale. - Tricuspid valve: No evidence of vegetation. Trivial regurgitation. - Pulmonic valve: No evidence of vegetation.  Surface echo-02/26/08- Left atrium mildly dilated   Assessment and Plan: 1. PAF Options to manage afib were discussed with pt, including continuation of  current management, AAD such as multaq, sotalol, amiodarone or tikosyn. 1c agents would not apply due to presence of CAD For now, pt wants to continue current management.  Continue xarelto  2. Lifestyle risk factors Pt states 40 lb weight loss and congratulated on her success and  to continue with efforts Has spinal stenosis and does not exercise Encouraged to water walk or try recumbent bike which the pt thinks she can do without pain.  3. Snoring/possible sleep  apnea Schedule for sleep study.  4. HTN stable   F/u with Dr. Gwenlyn Found as scheduled Afib clinic as needed

## 2015-02-23 ENCOUNTER — Ambulatory Visit (HOSPITAL_COMMUNITY): Payer: Medicare Other | Admitting: Nurse Practitioner

## 2015-02-24 ENCOUNTER — Ambulatory Visit (HOSPITAL_COMMUNITY): Payer: Medicare Other | Admitting: Nurse Practitioner

## 2015-03-02 ENCOUNTER — Other Ambulatory Visit: Payer: Self-pay | Admitting: Cardiovascular Disease

## 2015-03-04 ENCOUNTER — Encounter: Payer: Self-pay | Admitting: Internal Medicine

## 2015-03-04 DIAGNOSIS — M79675 Pain in left toe(s): Secondary | ICD-10-CM

## 2015-03-04 DIAGNOSIS — M5416 Radiculopathy, lumbar region: Secondary | ICD-10-CM

## 2015-03-07 MED ORDER — GABAPENTIN 100 MG PO CAPS: ORAL_CAPSULE | ORAL | Status: DC

## 2015-03-09 ENCOUNTER — Other Ambulatory Visit: Payer: Self-pay | Admitting: Internal Medicine

## 2015-03-09 ENCOUNTER — Other Ambulatory Visit: Payer: Self-pay | Admitting: Cardiovascular Disease

## 2015-03-24 ENCOUNTER — Telehealth: Payer: Self-pay | Admitting: Cardiovascular Disease

## 2015-03-24 NOTE — Telephone Encounter (Signed)
Closed encounter °

## 2015-03-24 NOTE — Telephone Encounter (Signed)
[name] is calling because she has been experiencing some chest tightness and some shortness of breath . Please call   Thanks

## 2015-03-24 NOTE — Telephone Encounter (Signed)
Pt experiencing chest tightness esp w/ ambulation up steps. Shortness of breath assoc w/ this. Occurrence has been for about 1 week.  She is unsure of origin but thinks may be r/t her asthma.  Advised safe recommendation is to go to ED for evaluation w/ chest tightness and shortness of breath.  Pt voiced understanding.  She insists on trying to take increased dose of her Imdur - she takes half a tablet currently and another half tab "if I feel like I need it". She states she will do this and if not improved, go to ED.  I reinforced recommendation of ED evaluation - she voiced understanding.

## 2015-03-27 ENCOUNTER — Emergency Department (HOSPITAL_COMMUNITY)
Admission: EM | Admit: 2015-03-27 | Discharge: 2015-03-27 | Disposition: A | Discharge status: Home / Self Care | Payer: Medicare Other | Attending: Emergency Medicine | Admitting: Emergency Medicine

## 2015-03-27 ENCOUNTER — Encounter (HOSPITAL_COMMUNITY): Payer: Self-pay | Admitting: *Deleted

## 2015-03-27 ENCOUNTER — Emergency Department (HOSPITAL_COMMUNITY): Payer: Medicare Other

## 2015-03-27 DIAGNOSIS — Z87891 Personal history of nicotine dependence: Secondary | ICD-10-CM | POA: Insufficient documentation

## 2015-03-27 DIAGNOSIS — M1990 Unspecified osteoarthritis, unspecified site: Secondary | ICD-10-CM | POA: Insufficient documentation

## 2015-03-27 DIAGNOSIS — E119 Type 2 diabetes mellitus without complications: Secondary | ICD-10-CM | POA: Insufficient documentation

## 2015-03-27 DIAGNOSIS — K219 Gastro-esophageal reflux disease without esophagitis: Secondary | ICD-10-CM | POA: Insufficient documentation

## 2015-03-27 DIAGNOSIS — R011 Cardiac murmur, unspecified: Secondary | ICD-10-CM | POA: Diagnosis not present

## 2015-03-27 DIAGNOSIS — I2510 Atherosclerotic heart disease of native coronary artery without angina pectoris: Secondary | ICD-10-CM | POA: Insufficient documentation

## 2015-03-27 DIAGNOSIS — Z862 Personal history of diseases of the blood and blood-forming organs and certain disorders involving the immune mechanism: Secondary | ICD-10-CM | POA: Diagnosis not present

## 2015-03-27 DIAGNOSIS — E785 Hyperlipidemia, unspecified: Secondary | ICD-10-CM | POA: Diagnosis not present

## 2015-03-27 DIAGNOSIS — Z8701 Personal history of pneumonia (recurrent): Secondary | ICD-10-CM | POA: Diagnosis not present

## 2015-03-27 DIAGNOSIS — Z79899 Other long term (current) drug therapy: Secondary | ICD-10-CM | POA: Diagnosis not present

## 2015-03-27 DIAGNOSIS — R079 Chest pain, unspecified: Secondary | ICD-10-CM

## 2015-03-27 DIAGNOSIS — G8929 Other chronic pain: Secondary | ICD-10-CM | POA: Insufficient documentation

## 2015-03-27 DIAGNOSIS — J441 Chronic obstructive pulmonary disease with (acute) exacerbation: Secondary | ICD-10-CM | POA: Insufficient documentation

## 2015-03-27 DIAGNOSIS — I10 Essential (primary) hypertension: Secondary | ICD-10-CM | POA: Insufficient documentation

## 2015-03-27 LAB — I-STAT TROPONIN, ED
Troponin i, poc: 0 ng/mL (ref 0.00–0.08)
Troponin i, poc: 0 ng/mL (ref 0.00–0.08)

## 2015-03-27 LAB — CBC
HCT: 35.1 % — ABNORMAL LOW (ref 36.0–46.0)
Hemoglobin: 12 g/dL (ref 12.0–15.0)
MCH: 26.3 pg (ref 26.0–34.0)
MCHC: 34.2 g/dL (ref 30.0–36.0)
MCV: 76.8 fL — ABNORMAL LOW (ref 78.0–100.0)
Platelets: 262 10*3/uL (ref 150–400)
RBC: 4.57 MIL/uL (ref 3.87–5.11)
RDW: 16 % — ABNORMAL HIGH (ref 11.5–15.5)
WBC: 6.8 10*3/uL (ref 4.0–10.5)

## 2015-03-27 LAB — BASIC METABOLIC PANEL
Anion gap: 9 (ref 5–15)
BUN: 15 mg/dL (ref 6–20)
CO2: 24 mmol/L (ref 22–32)
Calcium: 9 mg/dL (ref 8.9–10.3)
Chloride: 98 mmol/L — ABNORMAL LOW (ref 101–111)
Creatinine, Ser: 0.77 mg/dL (ref 0.44–1.00)
GFR calc Af Amer: >60 mL/min (ref >60)
GFR calc non Af Amer: >60 mL/min (ref >60)
Glucose, Bld: 211 mg/dL — ABNORMAL HIGH (ref 65–99)
Potassium: 4.4 mmol/L (ref 3.5–5.1)
Sodium: 131 mmol/L — ABNORMAL LOW (ref 135–145)

## 2015-03-27 MED ORDER — ASPIRIN 325 MG PO TABS
325.0000 mg | ORAL_TABLET | Freq: Every day | ORAL | Status: DC
  Administered 2015-03-27: 325 mg via ORAL
  Filled 2015-03-27: qty 1

## 2015-03-27 NOTE — ED Notes (Signed)
Dr. Lavena Bullion at bedside.

## 2015-03-27 NOTE — ED Notes (Signed)
Pt reports squeezing pain, non radiating, persistent for years.  Pt reports increased SOB x 1 week.

## 2015-03-27 NOTE — Discharge Instructions (Signed)

## 2015-03-27 NOTE — ED Notes (Signed)
Pt reports having a mid squeezing chest pain x 1 week and sob with exertion. Spoke with cardiology md and had change in meds, now reports decrease in symptoms except for cp/sob on exertion. Denies swelling. Airway intact, ekg done.

## 2015-03-28 ENCOUNTER — Telehealth: Payer: Self-pay | Admitting: Cardiovascular Disease

## 2015-03-28 NOTE — Telephone Encounter (Signed)
Pt is returning a call from this office

## 2015-03-28 NOTE — Telephone Encounter (Signed)
Pt called the other day - having chest tightness, SOB on exertion. She explains that she takes her 30mg  of Imdur in 2 15mg  doses daily (whole 30mg  at once gives her headache). Chest tightness usually resolves. She explains she went to ED the other day after it did not improve w/ 2nd 15mg  dose of Imdur.  Trop neg x2, EKG & CXR OK. Recommended to follow up w/ [place].   She already has appt Sept 6th w/ Dr. Gwenlyn Found.   Informed pt I would see if Dr. Gwenlyn Found recommends sooner f/u w/ extender & return call. Pt voiced understanding.

## 2015-03-28 NOTE — ED Provider Notes (Signed)
CSN: [id number]     Arrival date & time 03/27/15  1700 History   First MD Initiated Contact with Patient 03/27/15 1908     Chief Complaint  Patient presents with  . Chest Pain  . Shortness of Breath     (Consider location/radiation/quality/duration/timing/severity/associated sxs/prior Treatment) Patient is a 64 y.o. female presenting with chest pain. The history is provided by the patient and a relative. No language interpreter was used.  Chest Pain Pain location:  L chest and substernal area Pain quality: tightness   Pain quality comment:  "squeezing" Pain radiates to:  Does not radiate Pain radiates to the back: no   Pain severity:  Mild Onset quality:  Gradual Duration:  1 week Timing:  Intermittent Progression:  Unchanged Chronicity:  New Context: movement (walking up stairs particularly )   Context: not breathing, not lifting and not at rest   Relieved by:  Rest Worsened by:  Exertion Ineffective treatments:  Nitroglycerin (states has increased imdur with little improvement in tightness and SOB) Associated symptoms: fatigue and shortness of breath   Associated symptoms: no abdominal pain, no anxiety, no back pain, no cough, no diaphoresis, no dizziness, no fever, no lower extremity edema, no near-syncope, no numbness, no orthopnea, no palpitations, no PND, no syncope and not vomiting   Risk factors: coronary artery disease   Risk factors: not female     Past Medical History  Diagnosis Date  . Arthritis   . Asthma   . GERD (gastroesophageal reflux disease)   . Heart murmur   . Hyperlipidemia   . Hypertension   . Pneumonia 2008  . Goiter   . Diabetes mellitus     Type 2  . Anemia, unspecified   . Chronic low back pain   . Polycystic ovary disease     Hysterectomy in 1982 for this  . Bell's palsy     Facial nerve decompression in 2001  . Shortness of breath dyspnea     ECHO 02/26/08 showed only minor abnormalities  . Coronary artery disease     Myoview 04/12/11  was entirely normal. ECHO 02/26/08 showed only minor abnormalities. Stenting 05/26/08 of her posterolateral branch to the left circumflex coronary artery. Used a 2.5x30mm Taxus Monorail stent.myoview 2014 was without ischemia  . COPD with asthma   . Glaucoma   . Spinal stenosis   . History of nuclear stress test 2012; 2014    lexiscan; normal pattern of perfusion; normal, low risk scan   . Atrial fibrillation with RVR     a. on Xarelto   Past Surgical History  Procedure Laterality Date  . Abdominal hysterectomy  1982    & BSO; for polycystic ovary disease  . Facial nerve decompression  2001/2002    bells palsy   . Laparoscopic cholecystectomy  06/15/2011     Dr Dalbert Batman  . Colonoscopy      last 2009; Dr Cristina Gong; due 2019  . Upper gi endoscopy  2009    negative  . Coronary angioplasty  05/26/2008    Stenting of her posterolateral branch to the left circumflex coronary artery. Used a 2.5x53mm Taxus Monorail stent.  . Transthoracic echocardiogram  9/47/6546    LV systolic function normal with mild conc LVH; LA mildly dilated; trace MR/TR  . Tee without cardioversion N/A 12/17/2013    Procedure: TRANSESOPHAGEAL ECHOCARDIOGRAM (TEE);  Surgeon: Pixie Casino, MD;  Location: [facility] ENDOSCOPY;  Service: Cardiovascular;  Laterality: N/A;  trish/ja  . Cardioversion N/A 12/17/2013  Procedure: CARDIOVERSION;  Surgeon: Pixie Casino, MD;  Location: [facility] ENDOSCOPY;  Service: Cardiovascular;  Laterality: N/A;   Family History  Problem Relation Age of Onset  . Heart attack Father 44    2nd MI at 62  . Colon cancer Brother 24  . Gout Brother   . Diabetes Neg Hx   . Stroke Neg Hx   . Ulcers Mother   . Emphysema Mother 11  . Colon polyps Sister   . Cancer Sister     Basal cell carcinoma  . Pneumonia Maternal Grandmother   . Hypertension Brother   . Hyperlipidemia Brother   . Cancer Brother     Skin   Social History  Substance Use Topics  . Smoking status: Former Smoker -- 1.50 packs/day for  30 years    Types: Cigarettes    Quit date: 08/13/1993  . Smokeless tobacco: Never Used  . Alcohol Use: No   OB History    No data available     Review of Systems  Constitutional: Positive for fatigue. Negative for fever and diaphoresis.  HENT: Negative for congestion and rhinorrhea.   Respiratory: Positive for chest tightness and shortness of breath. Negative for cough.   Cardiovascular: Positive for chest pain. Negative for palpitations, orthopnea, syncope, PND and near-syncope.  Gastrointestinal: Negative for vomiting and abdominal pain.  Genitourinary: Negative for dysuria and hematuria.  Musculoskeletal: Negative for back pain.  Skin: Negative for color change and pallor.  Neurological: Negative for dizziness and numbness.  Psychiatric/Behavioral: Negative for confusion and agitation.  All other systems reviewed and are negative.     Allergies  Benadryl; Clopidogrel bisulfate; and Contrast media  Home Medications   Prior to Admission medications   Medication Sig Start Date End Date Taking? Authorizing Provider  amLODipine (NORVASC) 10 MG tablet TAKE (1) TABLET DAILY FOR HIGH BLOOD PRESSURE. Patient taking differently: TAKE (1) TABLET DAILY FOR HIGH BLOOD PRESSURE 03/09/15  Yes Hendricks Limes, MD  amoxicillin (AMOXIL) 500 MG capsule Take 2,000 mg by mouth daily as needed (FOR DENTAL VISITS).   Yes Historical Provider, MD  ascorbic acid (VITAMIN C) 1000 MG tablet Take 1,000 mg by mouth 2 (two) times daily.    Yes Historical Provider, MD  CALCIUM-MAG-VIT C-VIT D PO Take 2 tablets by mouth daily.    Yes Historical Provider, MD  celecoxib (CELEBREX) 200 MG capsule Take 200 mg by mouth 2 (two) times daily.  12/30/13  Yes Historical Provider, MD  CRESTOR 10 MG tablet TAKE ONE TABLET AT BEDTIME. 03/09/15  Yes Lorretta Harp, MD  estradiol (ESTRACE) 0.5 MG tablet Take 0.5 mg by mouth daily.  04/23/11  Yes Historical Provider, MD  gabapentin (NEURONTIN) 100 MG capsule One pill  every eight hours as needed; dose may be increased by one pill each dose after 72 hours if only partially effective 03/07/15  Yes Hendricks Limes, MD  guaiFENesin (MUCINEX) 600 MG 12 hr tablet Take 600 mg by mouth 2 (two) times daily.   Yes Historical Provider, MD  INVOKANA 100 MG TABS tablet TAKE 1 TABLET ONCE DAILY. 10/18/14  Yes Hendricks Limes, MD  isosorbide mononitrate (IMDUR) 30 MG 24 hr tablet Take 1 tablet (30 mg total) by mouth daily. Patient taking differently: Take 15 mg by mouth 2 (two) times daily.  07/05/14  Yes Lorretta Harp, MD  JANUMET 50-1000 MG per tablet TAKE 1/2 TABLET IN THE MORNING WITH BREAKFAST AND 1 TABLET IN THE EVENING WITH SUPPER. 02/10/15  Yes Hendricks Limes, MD  latanoprost (XALATAN) 0.005 % ophthalmic solution Place 1 drop into both eyes at bedtime. 12/09/13  Yes Historical Provider, MD  levalbuterol Penne Lash HFA) 45 MCG/ACT inhaler Inhale 1-2 puffs into the lungs every 6 (six) hours as needed for wheezing. 06/25/13  Yes Mancel Bale, PA-C  levalbuterol (XOPENEX) 0.63 MG/3ML nebulizer solution Take 3 mLs (0.63 mg total) by nebulization every 6 (six) hours as needed for wheezing or shortness of breath. 08/21/13  Yes Darlyne Russian, MD  LORazepam (ATIVAN) 0.5 MG tablet Take 1 mg by mouth at bedtime.  04/23/11  Yes Historical Provider, MD  Melatonin 5 MG TABS Take 1 tablet by mouth at bedtime.    Yes Historical Provider, MD  metoprolol succinate (TOPROL-XL) 50 MG 24 hr tablet Take 3 tablets (150 mg total) by mouth daily. Take with or immediately following a meal. 02/09/15  Yes Pixie Casino, MD  metoprolol tartrate (LOPRESSOR) 25 MG tablet Take 1 Tablet AS NEEDED for Irregular Heart Rate 06/29/14  Yes Lorretta Harp, MD  Multiple Vitamins-Iron (MULTIVITAMINS WITH IRON) TABS Take 1 tablet by mouth 2 (two) times daily.    Yes Historical Provider, MD  omega-3 acid ethyl esters (LOVAZA) 1 G capsule Take 1 g by mouth daily.    Yes Historical Provider, MD  omeprazole  (PRILOSEC) 20 MG capsule Take 20 mg by mouth daily.    Yes Historical Provider, MD  PARoxetine (PAXIL) 10 MG tablet Take 25 mg by mouth daily.    Yes Historical Provider, MD  quinapril (ACCUPRIL) 40 MG tablet TAKE 1 TABLET ONCE DAILY. 02/10/15  Yes Hendricks Limes, MD  senna (SENOKOT) 8.6 MG TABS Take 1 tablet by mouth daily.    Yes Historical Provider, MD  XARELTO 20 MG TABS tablet TAKE 1 TABLET EACH DAY. Patient taking differently: TAKE 1 TABLET EACH DAY EVERY EVENING AFTER SUPPER 03/02/15  Yes Lorretta Harp, MD  budesonide-formoterol [facility]) 160-4.5 MCG/ACT inhaler Inhale 2 puffs into the lungs 2 (two) times daily. Patient taking differently: Inhale 2 puffs into the lungs 2 (two) times daily as needed.  08/21/13   Darlyne Russian, MD  [facility] DELICA LANCETS FINE MISC 1 Units by Does not apply route as directed. 10/05/11   Midge Minium, MD  traMADol (ULTRAM) 50 MG tablet Take 1 tablet (50 mg total) by mouth every 6 (six) hours as needed. 1 q 8 hrs prn 04/26/14   Hendricks Limes, MD   BP 121/55 mmHg  Pulse 56  Temp(Src) 97.5 F (36.4 C) (Oral)  Resp 21  Ht 5\' 2"  (1.575 m)  Wt 194 lb 3.2 oz (88.089 kg)  BMI 35.51 kg/m2  SpO2 95% Physical Exam  Constitutional: She is oriented to person, place, and time. No distress.  Appears stated age  HENT:  Head: Normocephalic and atraumatic.  Eyes: Conjunctivae and EOM are normal.  Neck: Normal range of motion. Neck supple.  Cardiovascular: Normal rate, regular rhythm and normal heart sounds.   No murmur heard. Pulmonary/Chest: Effort normal and breath sounds normal.  Abdominal: Soft. Bowel sounds are normal. There is no tenderness. There is no rebound and no guarding.  Musculoskeletal: Normal range of motion. She exhibits no tenderness.  Neurological: She is alert and oriented to person, place, and time.  Skin: Skin is warm and dry. She is not diaphoretic.  Psychiatric: Her behavior is normal.  Nursing note and vitals reviewed.   ED  Course  Procedures (including critical care  time) Labs Review Labs Reviewed  BASIC METABOLIC PANEL - Abnormal; Notable for the following:    Sodium 131 (*)    Chloride 98 (*)    Glucose, Bld 211 (*)    All other components within normal limits  CBC - Abnormal; Notable for the following:    HCT 35.1 (*)    MCV 76.8 (*)    RDW 16.0 (*)    All other components within normal limits  I-STAT TROPOININ, ED  Randolm Idol, ED    Imaging Review Dg Chest 2 View  03/27/2015   CLINICAL DATA:  Acute onset of chest tightness, particularly on exertion. Initial encounter.  EXAM: CHEST  2 VIEW  COMPARISON:  Chest radiograph performed 12/16/2013  FINDINGS: The lungs are well-aerated. Mild peribronchial thickening is noted. There is no evidence of focal opacification, pleural effusion or pneumothorax.  The heart is normal in size; the mediastinal contour is within normal limits. No acute osseous abnormalities are seen.  IMPRESSION: No acute cardiopulmonary process seen. Mild peribronchial thickening noted.   Electronically Signed   By: Garald Balding M.D.   On: 03/27/2015 18:13   I, Theodosia Quay, personally reviewed and evaluated these images and lab results as part of my medical decision-making.   EKG Interpretation   Date/Time:  Sunday March 27 2015 17:08:21 EDT Ventricular Rate:  67 PR Interval:  196 QRS Duration: 68 QT Interval:  386 QTC Calculation: 407 R Axis:   34 Text Interpretation:  Normal sinus rhythm Abnormal ECG Slow R progression  Confirmed by Jeneen Rinks  MD, [place] ([id number]) on 03/27/2015 5:15:02 PM      MDM   Final diagnoses:  Chest pain, unspecified chest pain type    Patient presents today with approximately 1 week of chest tightness and shortness of breath. Differential includes ACS, stable angina, URI, CHF. Symptoms sound c/w stable angina as symptoms improve with rest. Delta troponin in department is negative today. No acute ischemic changes noted on EKG as noted above.  Afebrile, lungs CTAB -- URI or CHF sound less likely. No findings that would indicate fluid overload.   Pt has close follow-up with cardiologist within the next two weeks. Agreeable to keep this appointment. Advised patient to come to ED if symptoms change within the next few days. Patient able to ambulate without difficult and in no acute distress at time of discharge.     Theodosia Quay, MD 03/28/15 6045  Ripley Fraise, MD 03/29/15 1249

## 2015-03-29 NOTE — Telephone Encounter (Signed)
He is to be seen in the office by either myself or mid-level provider within the next week

## 2015-03-30 NOTE — Telephone Encounter (Signed)
Msg sent to scheduling

## 2015-03-30 NOTE — Telephone Encounter (Signed)
Billie arranged appt w/ Tarri Fuller for 8/23

## 2015-03-31 ENCOUNTER — Telehealth: Payer: Self-pay | Admitting: Cardiovascular Disease

## 2015-03-31 NOTE — Telephone Encounter (Signed)
error 

## 2015-04-05 ENCOUNTER — Ambulatory Visit: Payer: Medicare Other | Admitting: Physician Assistant

## 2015-04-08 ENCOUNTER — Telehealth: Payer: Self-pay | Admitting: Cardiovascular Disease

## 2015-04-08 DIAGNOSIS — I25118 Atherosclerotic heart disease of native coronary artery with other forms of angina pectoris: Secondary | ICD-10-CM

## 2015-04-08 NOTE — Telephone Encounter (Signed)
Patient called back regarding symptoms and complaints she called about on 8/15. Had been in ER for eval of CP.  We had set up post-ED visit appt to see Samara Snide on 8/23 regarding her complaints of exertional CP, dyspnea  - patient ended up cancelling this, reports anxiety related to going to other building & felt afraid to go.  - does not identify worsening symptoms at this time. Imdur working as indicated.  She called back again today - still has appt to see Dr. Gwenlyn Found on 9/6. She wants to know if it would be reasonable to order stress test in advance of appt. Informed her I would be happy to defer this to Dr. Gwenlyn Found - routing to advise.

## 2015-04-08 NOTE — Telephone Encounter (Signed)
Pt says that she is returning Nathan's call from last week. Please assist  Thanks

## 2015-04-09 NOTE — Telephone Encounter (Signed)
Absolutely. Get Lexiscan in advance of her appointmnet with me

## 2015-04-11 ENCOUNTER — Telehealth (HOSPITAL_COMMUNITY): Payer: Self-pay | Admitting: *Deleted

## 2015-04-11 NOTE — Telephone Encounter (Signed)
Ordered lexiscan, will route msg to Ebony to see if this can be scheduled prior to 9/6.  Contacted patient to inform of pending call for stress test scheduling. She voiced understanding & thanks.

## 2015-04-12 ENCOUNTER — Telehealth (HOSPITAL_COMMUNITY): Payer: Self-pay | Admitting: Radiology

## 2015-04-12 NOTE — Telephone Encounter (Signed)
Encounter complete. 

## 2015-04-13 ENCOUNTER — Ambulatory Visit (HOSPITAL_COMMUNITY)
Admission: RE | Admit: 2015-04-13 | Discharge: 2015-04-13 | Disposition: A | Discharge status: Home / Self Care | Payer: Medicare Other | Source: Ambulatory Visit | Attending: Cardiovascular Disease | Admitting: Cardiovascular Disease

## 2015-04-13 DIAGNOSIS — R079 Chest pain, unspecified: Secondary | ICD-10-CM | POA: Diagnosis not present

## 2015-04-13 DIAGNOSIS — Z8249 Family history of ischemic heart disease and other diseases of the circulatory system: Secondary | ICD-10-CM | POA: Insufficient documentation

## 2015-04-13 DIAGNOSIS — E119 Type 2 diabetes mellitus without complications: Secondary | ICD-10-CM | POA: Diagnosis not present

## 2015-04-13 DIAGNOSIS — I10 Essential (primary) hypertension: Secondary | ICD-10-CM | POA: Diagnosis not present

## 2015-04-13 DIAGNOSIS — R0602 Shortness of breath: Secondary | ICD-10-CM | POA: Diagnosis not present

## 2015-04-13 DIAGNOSIS — F17200 Nicotine dependence, unspecified, uncomplicated: Secondary | ICD-10-CM | POA: Diagnosis not present

## 2015-04-13 DIAGNOSIS — I25118 Atherosclerotic heart disease of native coronary artery with other forms of angina pectoris: Secondary | ICD-10-CM | POA: Diagnosis not present

## 2015-04-13 LAB — MYOCARDIAL PERFUSION IMAGING
Peak HR: 102 {beats}/min
Rest HR: 114 {beats}/min
SDS: 2
SRS: 1
SSS: 3
TID: 1.02

## 2015-04-13 MED ORDER — TECHNETIUM TC 99M SESTAMIBI GENERIC - CARDIOLITE
32.5000 | Freq: Once | INTRAVENOUS | Status: AC | PRN
  Administered 2015-04-13: 33 via INTRAVENOUS

## 2015-04-13 MED ORDER — TECHNETIUM TC 99M SESTAMIBI GENERIC - CARDIOLITE
10.8000 | Freq: Once | INTRAVENOUS | Status: AC | PRN
  Administered 2015-04-13: 11 via INTRAVENOUS

## 2015-04-13 MED ORDER — REGADENOSON 0.4 MG/5ML IV SOLN
0.4000 mg | Freq: Once | INTRAVENOUS | Status: AC
  Administered 2015-04-13: 0.4 mg via INTRAVENOUS

## 2015-04-19 ENCOUNTER — Encounter: Payer: Self-pay | Admitting: Cardiovascular Disease

## 2015-04-19 ENCOUNTER — Other Ambulatory Visit: Payer: Self-pay | Admitting: Internal Medicine

## 2015-04-19 ENCOUNTER — Ambulatory Visit (INDEPENDENT_AMBULATORY_CARE_PROVIDER_SITE_OTHER): Payer: Medicare Other | Admitting: Cardiovascular Disease

## 2015-04-19 VITALS — BP 128/60 | HR 120 | Ht 62.0 in | Wt 200.0 lb

## 2015-04-19 DIAGNOSIS — I10 Essential (primary) hypertension: Secondary | ICD-10-CM | POA: Diagnosis not present

## 2015-04-19 DIAGNOSIS — I4891 Unspecified atrial fibrillation: Secondary | ICD-10-CM

## 2015-04-19 DIAGNOSIS — I2583 Coronary atherosclerosis due to lipid rich plaque: Secondary | ICD-10-CM

## 2015-04-19 DIAGNOSIS — E785 Hyperlipidemia, unspecified: Secondary | ICD-10-CM

## 2015-04-19 DIAGNOSIS — I2510 Atherosclerotic heart disease of native coronary artery without angina pectoris: Secondary | ICD-10-CM

## 2015-04-19 MED ORDER — DILTIAZEM HCL ER COATED BEADS 180 MG PO CP24: 180.0000 mg | ORAL_CAPSULE | Freq: Every day | ORAL | Status: DC

## 2015-04-19 NOTE — Progress Notes (Signed)
04/19/2015 [name]   1951/07/27  [id number]  Primary Physician Unice Cobble, MD Primary Cardiologist: Lorretta Harp MD [name]   HPI:  The patient is a very pleasant 64 year old moderately overweight divorced Caucasian female, mother of 2 adopted children, ages 59 and 70, who is formerly a patient of Dr. Ky Barban. I saw her 06/29/14. 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 Her risk factors include former tobacco abuse, having quit 18 years ago, diabetes, hypertension, hyperlipidemia, and a positive family history for heart disease. She has adult-onset asthma as well. Dr. Melvern Banker stented her distal circumflex posterolateral branch, May 26, 2008, with some residual disease in the PDA branch, and normal LV function. Myoview performed May 06, 2008, was not ischemic. I saw her summer of last year, at which time she was complaining of chest pain, for which I prescribed Imdur, which she never filled. I suggested a stress test, which was never obtained. Because of her recurrent chest tightness and increasing dyspnea with radiation to her left neck, I performed cardiac catheterization on her, April 20, 2011, revealing a patent stent, 50% distal PDA lesion, with normal LV function. I thought her disease was stable and had not progressed, and recommended medical therapy. She has had no recurrent chest pain since I last saw her. She has some mild dyspnea. Recent lab work /2/13 revealed a total cholesterol of 129, LDL of 63, and HDL of 45.  She saw Cecilie [name] in the office 05/13/13 with chest pain. A subsequent Myoview stress test was entirely normal. She was admitted to the hospital earlier this month with a good RPR which by history had been on for several weeks but prior. She was symptomatic. She'll underwent transesophageal guided DC cardioversion by Dr. [name] successfully with one shock and was placed on Xarelto oral anticoagulation. She felt  markedly improved clinically. She had a recurrent episode of atrial fibrillation 04/25/14 and underwent DC cardioversion by Dr. [name] in the ER. Her atenolol was changed to Toprol-XL 100 mg. She has seen Roderic Palau in the A. Fib clinic since my last visit with her. She still has paroxysmal A. Fib and is actually in A. Fib today. She is was recently seen in the emergency room for chest pain shortness of breath. At that time she was in sinus rhythm. She ruled out for myocardial infarction. A Myoview stress test recently performed as an outpatient 04/17/15 was normal. The study did not gait because she was in A. Fib at that time.   Current Outpatient Prescriptions  Medication Sig Dispense Refill  . ascorbic acid (VITAMIN C) 1000 MG tablet Take 1,000 mg by mouth 2 (two) times daily.     . budesonide-formoterol (SYMBICORT) 160-4.5 MCG/ACT inhaler Inhale 2 puffs into the lungs 2 (two) times daily. (Patient taking differently: Inhale 2 puffs into the lungs 2 (two) times daily as needed. ) 1 Inhaler 3  . CALCIUM-MAG-VIT C-VIT D PO Take 2 tablets by mouth daily.     . celecoxib (CELEBREX) 200 MG capsule Take 200 mg by mouth 2 (two) times daily.     . CRESTOR 10 MG tablet TAKE ONE TABLET AT BEDTIME. 30 tablet 3  . estradiol (ESTRACE) 0.5 MG tablet Take 0.5 mg by mouth daily.     [name] gabapentin (NEURONTIN) 100 MG capsule One pill every eight hours as needed; dose may be increased by one pill each dose after 72 hours if only partially effective 180 capsule 1  .  guaiFENesin (MUCINEX) 600 MG 12 hr tablet Take 600 mg by mouth 2 (two) times daily.    . INVOKANA 100 MG TABS tablet TAKE 1 TABLET ONCE DAILY. 30 tablet 5  . isosorbide mononitrate (IMDUR) 30 MG 24 hr tablet Take 1 tablet (30 mg total) by mouth daily. (Patient taking differently: Take 15 mg by mouth 2 (two) times daily. ) 30 tablet 10  . JANUMET 50-1000 MG per tablet TAKE 1/2 TABLET IN THE MORNING WITH BREAKFAST AND 1 TABLET IN THE EVENING WITH SUPPER.  45 tablet 3  . latanoprost (XALATAN) 0.005 % ophthalmic solution Place 1 drop into both eyes at bedtime.    . levalbuterol (XOPENEX HFA) 45 MCG/ACT inhaler Inhale 1-2 puffs into the lungs every 6 (six) hours as needed for wheezing. 1 Inhaler 1  . levalbuterol (XOPENEX) 0.63 MG/3ML nebulizer solution Take 3 mLs (0.63 mg total) by nebulization every 6 (six) hours as needed for wheezing or shortness of breath. 75 mL 1  . LORazepam (ATIVAN) 0.5 MG tablet Take 1 mg by mouth at bedtime.     . Melatonin 5 MG TABS Take 1 tablet by mouth at bedtime.     . metoprolol succinate (TOPROL-XL) 50 MG 24 hr tablet Take 3 tablets (150 mg total) by mouth daily. Take with or immediately following a meal. 90 tablet 3  . metoprolol tartrate (LOPRESSOR) 25 MG tablet Take 1 Tablet AS NEEDED for Irregular Heart Rate 15 tablet 1  . Multiple Vitamins-Iron (MULTIVITAMINS WITH IRON) TABS Take 1 tablet by mouth 2 (two) times daily.     [name] omega-3 acid ethyl esters (LOVAZA) 1 G capsule Take 1 g by mouth daily.     [name] omeprazole (PRILOSEC) 20 MG capsule Take 20 mg by mouth daily.     Glory Rosebush DELICA LANCETS FINE MISC 1 Units by Does not apply route as directed. 100 each 3  . PARoxetine (PAXIL) 10 MG tablet Take 25 mg by mouth daily.     . quinapril (ACCUPRIL) 40 MG tablet TAKE 1 TABLET ONCE DAILY. 30 tablet 3  . senna (SENOKOT) 8.6 MG TABS Take 1 tablet by mouth daily.     . traMADol (ULTRAM) 50 MG tablet Take 1 tablet (50 mg total) by mouth every 6 (six) hours as needed. 1 q 8 hrs prn 30 tablet 1  . XARELTO 20 MG TABS tablet TAKE 1 TABLET EACH DAY. (Patient taking differently: TAKE 1 TABLET EACH DAY EVERY EVENING AFTER SUPPER) 30 tablet 4  . diltiazem (CARDIZEM CD) 180 MG 24 hr capsule Take 1 capsule (180 mg total) by mouth daily. 30 capsule 6   No current facility-administered medications for this visit.    Allergies  Allergen Reactions  . Benadryl [Diphenhydramine Hcl] Other (See Comments)    Restless leg  .  Clopidogrel Bisulfate Nausea Only    Nausea & pain  . Contrast Media [Iodinated Diagnostic Agents] Palpitations    Rapid heart rate, hot    Social History   Social History  . Marital Status: Married    Spouse Name: N/A  . Number of Children: 2  . Years of Education: master's   Occupational History  . Speech Pathologist    Social History Main Topics  . Smoking status: Former Smoker -- 1.50 packs/day for 30 years    Types: Cigarettes    Quit date: 08/13/1993  . Smokeless tobacco: Never Used  . Alcohol Use: No  . Drug Use: No  . Sexual Activity: Not on file  Other Topics Concern  . Not on file   Social History Narrative     Review of Systems: General: negative for chills, fever, night sweats or weight changes.  Cardiovascular: negative for chest pain, dyspnea on exertion, edema, orthopnea, palpitations, paroxysmal nocturnal dyspnea or shortness of breath Dermatological: negative for rash Respiratory: negative for cough or wheezing Urologic: negative for hematuria Abdominal: negative for nausea, vomiting, diarrhea, bright red blood per rectum, melena, or hematemesis Neurologic: negative for visual changes, syncope, or dizziness All other systems reviewed and are otherwise negative except as noted above.    Blood pressure 128/60, pulse 120, height 5\' 2"  (1.575 m), weight 200 lb (90.719 kg).  General appearance: alert and no distress Neck: no adenopathy, no carotid bruit, no JVD, supple, symmetrical, trachea midline and thyroid not enlarged, symmetric, no tenderness/mass/nodules Lungs: clear to auscultation bilaterally Heart: irregularly irregular rhythm Extremities: extremities normal, atraumatic, no cyanosis or edema Pulses: 2+ and symmetric Skin: Skin color, texture, turgor normal. No rashes or lesions Neurologic: Grossly normal  EKG not performed today  ASSESSMENT AND PLAN:   Hyperlipidemia History of hyperlipidemia on Lovaza and Crestor  with recent lipid  profile performed 12/24/14 revealing a total cholesterol of 43, LDL 66 and HDL of 52.  Essential hypertension History of hypertension with blood pressure measured at 128/60. She is on amlodipine, metoprolol and quinapril. Continue current meds at current dosing  CAD (coronary artery disease), hx of LCX stent DES 2009,  last cath 2012 50-60% RCA stenosis, patent stent, 30 % LM. History of CAD status post circumflex stenting by Dr. Melvern Banker 05/26/08 with residual disease in the PDA branch and normal LV function. I performed cardiac catheterization on her 04/20/11 revealing a patent stent with 50% distal PDA lesion and normal LV function. I thought her disease was stable at that time recommended medical therapy  Atrial fibrillation with RVR History of age for fibrillation status post transesophageal guided DC cardioversion by Dr. [name] in the past. Her last cardioversion was performed by Dr. [name] in the ER 04/25/14. She is on Xarelto oral anticoagulation. She was recently seen in the emergency room approximately 2 weeks ago with chest pain fatigue and shortness of breath. She was in sinus rhythm at the time. Enzymes were negative. She is in A. Fib today. She has seen Dr. Kayleen Memos in the A. Fib clinic in the past. I suspect she will need repeat cardioversion and either the addition of an anti-arrhythmic agent versus discussion regarding A. Fib ablation. She is symptomatic from this. Today her heart rate is in the 120 range. I'm going to change her amlodipine to Cardizem CD 180 mg for rate control.      Lorretta Harp MD FACP,FACC,FAHA, [facility] 04/19/2015 2:41 PM

## 2015-04-19 NOTE — Assessment & Plan Note (Signed)
History of age for fibrillation status post transesophageal guided DC cardioversion by Dr. [name] in the past. Her last cardioversion was performed by Dr. [name] in the ER 04/25/14. She is on Xarelto oral anticoagulation. She was recently seen in the emergency room approximately 2 weeks ago with chest pain fatigue and shortness of breath. She was in sinus rhythm at the time. Enzymes were negative. She is in A. Fib today. She has seen Dr. Kayleen Memos in the A. Fib clinic in the past. I suspect she will need repeat cardioversion and either the addition of an anti-arrhythmic agent versus discussion regarding A. Fib ablation. She is symptomatic from this. Today her heart rate is in the 120 range. I'm going to change her amlodipine to Cardizem CD 180 mg for rate control.

## 2015-04-19 NOTE — Assessment & Plan Note (Signed)
History of CAD status post circumflex stenting by Dr. Melvern Banker 05/26/08 with residual disease in the PDA branch and normal LV function. I performed cardiac catheterization on her 04/20/11 revealing a patent stent with 50% distal PDA lesion and normal LV function. I thought her disease was stable at that time recommended medical therapy

## 2015-04-19 NOTE — Assessment & Plan Note (Signed)
History of hypertension with blood pressure measured at 128/60. She is on amlodipine, metoprolol and quinapril. Continue current meds at current dosing

## 2015-04-19 NOTE — Telephone Encounter (Signed)
Not on current medication list. Please advise.

## 2015-04-19 NOTE — Telephone Encounter (Signed)
She needs OV ; please bring glucose readings

## 2015-04-19 NOTE — Assessment & Plan Note (Addendum)
History of hyperlipidemia on Lovaza and Crestor  with recent lipid profile performed 12/24/14 revealing a total cholesterol of 43, LDL 66 and HDL of 52.

## 2015-04-19 NOTE — Patient Instructions (Signed)
Medication Instructions:   Start Cardizem CD 180mg  daily Stop Amlodipine  Labwork:  n/a  Testing/Procedures:  n/a  Follow-Up:  2 weeks with Roderic Palau in the Afib clinic 6 months with Dr Gwenlyn Found  Any Other Special Instructions Will Be Listed Below (If Applicable).

## 2015-04-20 NOTE — Telephone Encounter (Signed)
LVM for pt to call back and schedule office visit.

## 2015-04-21 NOTE — Telephone Encounter (Signed)
I have made an appointment for Tuesday for patient and so she is wanting her refill

## 2015-04-22 NOTE — Telephone Encounter (Signed)
Sent 1 week in until appt...[name]

## 2015-04-26 ENCOUNTER — Ambulatory Visit (INDEPENDENT_AMBULATORY_CARE_PROVIDER_SITE_OTHER): Payer: Medicare Other | Admitting: Internal Medicine

## 2015-04-26 ENCOUNTER — Other Ambulatory Visit (INDEPENDENT_AMBULATORY_CARE_PROVIDER_SITE_OTHER): Payer: Medicare Other

## 2015-04-26 ENCOUNTER — Other Ambulatory Visit: Payer: Self-pay | Admitting: Internal Medicine

## 2015-04-26 ENCOUNTER — Encounter: Payer: Self-pay | Admitting: Internal Medicine

## 2015-04-26 VITALS — BP 130/60 | HR 94 | Temp 98.4°F | Resp 18 | Wt 195.0 lb

## 2015-04-26 DIAGNOSIS — E1159 Type 2 diabetes mellitus with other circulatory complications: Secondary | ICD-10-CM

## 2015-04-26 DIAGNOSIS — E1151 Type 2 diabetes mellitus with diabetic peripheral angiopathy without gangrene: Secondary | ICD-10-CM | POA: Diagnosis not present

## 2015-04-26 DIAGNOSIS — I10 Essential (primary) hypertension: Secondary | ICD-10-CM

## 2015-04-26 DIAGNOSIS — D649 Anemia, unspecified: Secondary | ICD-10-CM | POA: Diagnosis not present

## 2015-04-26 DIAGNOSIS — E785 Hyperlipidemia, unspecified: Secondary | ICD-10-CM

## 2015-04-26 LAB — HEMOGLOBIN A1C: Hgb A1c MFr Bld: 7 % — ABNORMAL HIGH (ref 4.6–6.5)

## 2015-04-26 NOTE — Progress Notes (Signed)
   Subjective:    Patient ID: [name], female    DOB: 1951/02/28, 64 y.o.   MRN: [id number]  HPI  The patient is here to assess status of active health conditions.  PMH, FH, & Social History reviewed & updated.No change in [place] as recorded.   She does not eat excessive amounts of fried foods or red meat. She does eat some salt. She walks approximately 2 blocks up to 4 times a week. Her exercise is limited by  A. fib which has exacerbated. She has been having some exertional chest pain associated with nausea exertional dyspnea.   She has been cardioverted twice and has a follow-up in the AF clinic 04/29/15.  She's not recording her blood pressure measurements. Glucoses range 116-175. A1c was 6.8% on 12/13/14. Urine microalbumin had improved from 13.8 in August 2015 to 5.9 in May of this year. Ophthalmologic exam is overdue. She's not seen a Podiatrist.  Because of anemia she had a colonoscopy in January of this year. Tubular adenoma was resected. She has no active GI symptoms at this time. Her hematocrit improved from 34.6 on 5/13 to a value of 35.1 on 8/14. Lipids were at goal on 12/24/14.   Review of Systems  Palpitations, tachycardia,  paroxysmal nocturnal dyspnea, claudication or edema are absent. No unexplained weight loss, abdominal pain, significant dyspepsia, dysphagia, melena, rectal bleeding, or persistently small caliber stools. Dysuria, pyuria, or hematuria or polyuria are denied. She does have frequency and urgency. Change in hair, skin, nails denied. No bowel changes of constipation or diarrhea. No intolerance to heat or cold. She has bruising but is on Xarelto. Epistaxis or hemoptysis denied.There is no abnormal bleeding or difficulty stopping bleeding with injury.     Objective:   Physical Exam  Pertinent or positive findings include: Heart rate is subtly irregular. Deep tendon reflexes are 1.5+ at the knees. Great toenails are absent. Pedal pulses are decreased,  especially the dorsalis pedis pulses.  General appearance :adequately nourished; in no distress.  Eyes: No conjunctival inflammation or scleral icterus is present.  Oral exam:  Lips and gums are healthy appearing.There is no oropharyngeal erythema or exudate noted. Dental hygiene is good.  Heart:  S1 and S2 normal without gallop, murmur, click, rub or other extra sounds    Lungs:Chest clear to auscultation; no wheezes, rhonchi,rales ,or rubs present.No increased work of breathing.   Abdomen: bowel sounds normal, soft and non-tender without masses, organomegaly or hernias noted.  No guarding or rebound. .  Vascular : all pulses equal ; no bruits present.  Skin:Warm & dry.  Intact without suspicious lesions or rashes ; no tenting or jaundice   Lymphatic: No lymphadenopathy is noted about the head, neck, axilla.   Neuro: Strength, tone  normal. Sensation is intact over the feet.      Assessment & Plan:  See Current Assessment & Plan in Problem List under specific Diagnosis

## 2015-04-26 NOTE — Assessment & Plan Note (Signed)
Blood pressure goals reviewed. BMET was within normal limits on 03/27/15.

## 2015-04-26 NOTE — Patient Instructions (Signed)
Minimal Blood Pressure Goal= AVERAGE < 140/90;  Ideal is an AVERAGE < 135/85. This AVERAGE should be calculated from @ least 5-7 BP readings taken @ different times of day on different days of week. You should not respond to isolated BP readings , but rather the AVERAGE for that week .Please bring your  blood pressure cuff to office visits to verify that it is reliable.It  can also be checked against the blood pressure device at the pharmacy. Finger or wrist cuffs are not dependable; an arm cuff is. Your next office appointment will be determined based upon review of your pending labs .  Those written interpretation of the lab results and instructions will be transmitted to you by My Chart   Critical results will be called.   Followup as needed for any active or acute issue. Please report any significant change in your symptoms. 

## 2015-04-26 NOTE — Assessment & Plan Note (Signed)
A1c

## 2015-04-26 NOTE — Progress Notes (Signed)
Pre visit review using our clinic review tool, if applicable. No additional management support is needed unless otherwise documented below in the visit note. 

## 2015-04-26 NOTE — Assessment & Plan Note (Signed)
They are not indicated until at least November 2016.

## 2015-04-27 ENCOUNTER — Encounter: Payer: Self-pay | Admitting: Internal Medicine

## 2015-04-29 ENCOUNTER — Inpatient Hospital Stay (HOSPITAL_COMMUNITY): Admission: RE | Admit: 2015-04-29 | Payer: Medicare Other | Source: Ambulatory Visit | Admitting: Nurse Practitioner

## 2015-05-04 ENCOUNTER — Encounter (HOSPITAL_COMMUNITY): Payer: Self-pay | Admitting: Nurse Practitioner

## 2015-05-04 ENCOUNTER — Ambulatory Visit (HOSPITAL_COMMUNITY)
Admission: RE | Admit: 2015-05-04 | Discharge: 2015-05-04 | Disposition: A | Discharge status: Home / Self Care | Payer: Medicare Other | Source: Ambulatory Visit | Attending: Nurse Practitioner | Admitting: Nurse Practitioner

## 2015-05-04 VITALS — BP 152/56 | HR 65 | Ht 62.0 in | Wt 198.6 lb

## 2015-05-04 DIAGNOSIS — R0683 Snoring: Secondary | ICD-10-CM | POA: Diagnosis not present

## 2015-05-04 DIAGNOSIS — I10 Essential (primary) hypertension: Secondary | ICD-10-CM | POA: Insufficient documentation

## 2015-05-04 DIAGNOSIS — I480 Paroxysmal atrial fibrillation: Secondary | ICD-10-CM | POA: Insufficient documentation

## 2015-05-04 NOTE — Patient Instructions (Signed)
Parking code for oct 0900

## 2015-05-04 NOTE — Progress Notes (Signed)
Patient ID: HONI NAME, female   DOB: [birth year], 64 y.o.   MRN: [id number]     Primary Care Physician: Unice Cobble, MD Referring Physician: Dr. Ronald Lobo [name] is a 64 y.o. female with a h/o CAD, DM, HTN, obesity, PAF first diagnosed May of 2015 with successful DCCV x2, first in May 2015 and again in September 2016.She  Recently had an AF episode and metoprolol succinate was increased to 150 mg qd. She converted on this dose and has been maintaining SR.  No previous AAD. Chads vasc score of at least 5, on xarelto daily.   On last visit in the afib clinic, we discussed lifestyle triggers. Pt does not  smoke or drink alcohol.Her BP/Hgba1c is controlled. She is sedentary. She does snore, no previous sleep study. Sleep study is pending. We also discussed antiarrythmic, which she was not interested in at that time.  She saw Dr. Gwenlyn Found one week ago and was in afib. Amlodipine was stopped and cardizem was started. She converted within the day. Discussed AAD therapy  again, but she is still not interested since she is back in rhythm and would prefer to take a wait and see approach.  Today, she denies symptoms of palpitations, chest pain, shortness of breath, orthopnea, PND, lower extremity edema, dizziness, presyncope, syncope, or neurologic sequela. The patient is tolerating medications without difficulties and is otherwise without complaint today.   Past Medical History  Diagnosis Date  . Arthritis   . Asthma   . GERD (gastroesophageal reflux disease)   . Heart murmur   . Hyperlipidemia   . Hypertension   . Pneumonia 2008  . Goiter   . Diabetes mellitus     Type 2  . Anemia, unspecified   . Chronic low back pain   . Polycystic ovary disease     Hysterectomy in 1982 for this  . Bell's palsy     Facial nerve decompression in 2001  . Shortness of breath dyspnea     ECHO 02/26/08 showed only minor abnormalities  . Coronary artery disease     Myoview 04/12/11 was entirely normal.  ECHO 02/26/08 showed only minor abnormalities. Stenting 05/26/08 of her posterolateral branch to the left circumflex coronary artery. Used a 2.5x80mm Taxus Monorail stent.myoview 2014 was without ischemia  . COPD with asthma   . Glaucoma   . Spinal stenosis   . History of nuclear stress test 2012; 2014    lexiscan; normal pattern of perfusion; normal, low risk scan   . Atrial fibrillation with RVR     a. on Xarelto   Past Surgical History  Procedure Laterality Date  . Abdominal hysterectomy  1982    & BSO; for polycystic ovary disease  . Facial nerve decompression  2001/2002    bells palsy   . Laparoscopic cholecystectomy  06/15/2011     Dr Dalbert Batman  . Colonoscopy      last 2009; Dr Cristina Gong; due 2019  . Upper gi endoscopy  2009    negative  . Coronary angioplasty  05/26/2008    Stenting of her posterolateral branch to the left circumflex coronary artery. Used a 2.5x31mm Taxus Monorail stent.  . Transthoracic echocardiogram  11/26/6061    LV systolic function normal with mild conc LVH; LA mildly dilated; trace MR/TR  . Tee without cardioversion N/A 12/17/2013    Procedure: TRANSESOPHAGEAL ECHOCARDIOGRAM (TEE);  Surgeon: Pixie Casino, MD;  Location: [facility] ENDOSCOPY;  Service: Cardiovascular;  Laterality: N/A;  trish/ja  .  Cardioversion N/A 12/17/2013    Procedure: CARDIOVERSION;  Surgeon: Pixie Casino, MD;  Location: [facility] ENDOSCOPY;  Service: Cardiovascular;  Laterality: N/A;    Current Outpatient Prescriptions  Medication Sig Dispense Refill  . ascorbic acid (VITAMIN C) 1000 MG tablet Take 1,000 mg by mouth 2 (two) times daily.     . budesonide-formoterol (SYMBICORT) 160-4.5 MCG/ACT inhaler Inhale 2 puffs into the lungs 2 (two) times daily. (Patient taking differently: Inhale 2 puffs into the lungs 2 (two) times daily as needed. ) 1 Inhaler 3  . CALCIUM-MAG-VIT C-VIT D PO Take 2 tablets by mouth daily.     . celecoxib (CELEBREX) 200 MG capsule Take 200 mg by mouth 2 (two) times daily.      . CRESTOR 10 MG tablet TAKE ONE TABLET AT BEDTIME. 30 tablet 3  . diltiazem (CARDIZEM CD) 180 MG 24 hr capsule Take 1 capsule (180 mg total) by mouth daily. 30 capsule 6  . estradiol (ESTRACE) 0.5 MG tablet Take 0.5 mg by mouth daily.     [name] gabapentin (NEURONTIN) 100 MG capsule One pill every eight hours as needed; dose may be increased by one pill each dose after 72 hours if only partially effective 180 capsule 1  . guaiFENesin (MUCINEX) 600 MG 12 hr tablet Take 600 mg by mouth 2 (two) times daily.    . INVOKANA 100 MG TABS tablet TAKE 1 TABLET ONCE DAILY. 90 tablet 3  . isosorbide mononitrate (IMDUR) 30 MG 24 hr tablet Take 1 tablet (30 mg total) by mouth daily. (Patient taking differently: Take 15 mg by mouth 2 (two) times daily. ) 30 tablet 10  . JANUMET 50-1000 MG per tablet TAKE 1/2 TABLET IN THE MORNING WITH BREAKFAST AND 1 TABLET IN THE EVENING WITH SUPPER. 45 tablet 3  . latanoprost (XALATAN) 0.005 % ophthalmic solution Place 1 drop into both eyes at bedtime.    . levalbuterol (XOPENEX HFA) 45 MCG/ACT inhaler Inhale 1-2 puffs into the lungs every 6 (six) hours as needed for wheezing. 1 Inhaler 1  . levalbuterol (XOPENEX) 0.63 MG/3ML nebulizer solution Take 3 mLs (0.63 mg total) by nebulization every 6 (six) hours as needed for wheezing or shortness of breath. 75 mL 1  . LORazepam (ATIVAN) 0.5 MG tablet Take 1 mg by mouth at bedtime.     . Melatonin 5 MG TABS Take 1 tablet by mouth at bedtime.     . metoprolol succinate (TOPROL-XL) 50 MG 24 hr tablet Take 3 tablets (150 mg total) by mouth daily. Take with or immediately following a meal. 90 tablet 3  . metoprolol tartrate (LOPRESSOR) 25 MG tablet Take 1 Tablet AS NEEDED for Irregular Heart Rate 15 tablet 1  . Multiple Vitamins-Iron (MULTIVITAMINS WITH IRON) TABS Take 1 tablet by mouth 2 (two) times daily.     [name] omega-3 acid ethyl esters (LOVAZA) 1 G capsule Take 1 g by mouth daily.     [name] omeprazole (PRILOSEC) 20 MG capsule Take 20 mg by  mouth daily.     Glory Rosebush DELICA LANCETS FINE MISC 1 Units by Does not apply route as directed. 100 each 3  . PARoxetine (PAXIL) 10 MG tablet Take 25 mg by mouth daily.     . quinapril (ACCUPRIL) 40 MG tablet TAKE 1 TABLET ONCE DAILY. 30 tablet 3  . senna (SENOKOT) 8.6 MG TABS Take 1 tablet by mouth daily.     . traMADol (ULTRAM) 50 MG tablet Take 1 tablet (50 mg total) by  mouth every 6 (six) hours as needed. 1 q 8 hrs prn 30 tablet 1  . XARELTO 20 MG TABS tablet TAKE 1 TABLET EACH DAY. (Patient taking differently: TAKE 1 TABLET EACH DAY EVERY EVENING AFTER SUPPER) 30 tablet 4   No current facility-administered medications for this encounter.    Allergies  Allergen Reactions  . Benadryl [Diphenhydramine Hcl] Other (See Comments)    Restless leg  . Clopidogrel Bisulfate Nausea Only    Nausea & pain  . Contrast Media [Iodinated Diagnostic Agents] Palpitations    Rapid heart rate, hot    Social History   Social History  . Marital Status: Married    Spouse Name: N/A  . Number of Children: 2  . Years of Education: master's   Occupational History  . Speech Pathologist    Social History Main Topics  . Smoking status: Former Smoker -- 1.50 packs/day for 30 years    Types: Cigarettes    Quit date: 08/13/1993  . Smokeless tobacco: Never Used  . Alcohol Use: No  . Drug Use: No  . Sexual Activity: Not on file   Other Topics Concern  . Not on file   Social History Narrative    Family History  Problem Relation Age of Onset  . Heart attack Father 32    2nd MI at 63  . Colon cancer Brother 65  . Gout Brother   . Diabetes Neg Hx   . Stroke Neg Hx   . Ulcers Mother   . Emphysema Mother 30  . Colon polyps Sister   . Cancer Sister     Basal cell carcinoma  . Pneumonia Maternal Grandmother   . Hypertension Brother   . Hyperlipidemia Brother   . Cancer Brother     Skin    ROS- All systems are reviewed and negative except as per the HPI above  Physical Exam: Filed  Vitals:   05/04/15 1345  BP: 152/56  Pulse: 65  Height: 5\' 2"  (1.575 m)  Weight: 198 lb 9.6 oz (90.084 kg)    GEN- The patient is well appearing, alert and oriented x 3 today.   Head- normocephalic, atraumatic Eyes-  Sclera clear, conjunctiva pink Ears- hearing intact Oropharynx- clear Neck- supple, no JVP Lymph- no cervical lymphadenopathy Lungs- Clear to ausculation bilaterally, normal work of breathing Heart- Regular rate and rhythm, no murmurs, rubs or gallops, PMI not laterally displaced GI- soft, NT, ND, + BS Extremities- no clubbing, cyanosis, or edema MS- no significant deformity or atrophy Skin- no rash or lesion Psych- euthymic mood, full affect Neuro- strength and sensation are intact  EKG- SR with 1st degree AV block, cannot rule out anterior infarct, PR int 216 ms, QRS int 82 ms, QTc 451 ms.  Echo- TEE- 12/17/13- Left ventricle: The cavity size was normal. There was mild concentric hypertrophy. Systolic function was normal. The estimated ejection fraction was in the range of 55% to 60%. Wall motion was normal; there were no regional wall motion abnormalities. - Aortic valve: No evidence of vegetation. - Aorta: The aorta was normal, not dilated, and non-diseased. - Mitral valve: No evidence of vegetation. Trivial regurgitation. - Left atrium: No evidence of thrombus in the atrial cavity or appendage. - Right atrium: No evidence of thrombus in the atrial cavity or appendage. - Atrial septum: There was a patent foramen ovale. - Tricuspid valve: No evidence of vegetation. Trivial regurgitation. - Pulmonic valve: No evidence of vegetation.  Surface echo-02/26/08- Left atrium mildly dilated  Assessment and Plan:  1. PAF Options to manage afib were discussed with pt, including continuation of  current management, AAD such as multaq, sotalol, amiodarone or tikosyn. 1c agents would not apply due to presence of CAD. Ablation not an option unless  she fails at lest one antiarrythmic.  For now, pt wants to continue current management with recent addition of cardizem and being in normal rhythm.  Continue xarelto  2. Lifestyle risk factors Pt states 40 lb weight loss and congratulated on her success and to continue with efforts Has spinal stenosis and does not exercise Encouraged to water walk or try recumbent bike which the pt thinks she can do without pain.  3. Snoring/possible sleep apnea Schedule for sleep study.  4. HTN stable  F/u with afib clinic 6- 8 weeks.  Geroge Baseman [place], [facility] [address] [place], [place] [address] [phone]

## 2015-05-06 ENCOUNTER — Inpatient Hospital Stay (HOSPITAL_COMMUNITY): Admission: RE | Admit: 2015-05-06 | Payer: Medicare Other | Source: Ambulatory Visit | Admitting: Nurse Practitioner

## 2015-05-30 ENCOUNTER — Other Ambulatory Visit: Payer: Self-pay | Admitting: Internal Medicine

## 2015-06-10 ENCOUNTER — Other Ambulatory Visit: Payer: Self-pay | Admitting: Internal Medicine

## 2015-06-12 ENCOUNTER — Other Ambulatory Visit: Payer: Self-pay | Admitting: Internal Medicine

## 2015-06-13 ENCOUNTER — Other Ambulatory Visit: Payer: Self-pay | Admitting: Emergency Medicine

## 2015-06-13 MED ORDER — QUINAPRIL HCL 40 MG PO TABS: 40.0000 mg | ORAL_TABLET | Freq: Every day | ORAL | Status: DC

## 2015-06-15 ENCOUNTER — Inpatient Hospital Stay (HOSPITAL_COMMUNITY): Admission: RE | Admit: 2015-06-15 | Payer: Medicare Other | Source: Ambulatory Visit | Admitting: Nurse Practitioner

## 2015-06-27 ENCOUNTER — Other Ambulatory Visit: Payer: Self-pay | Admitting: Cardiovascular Disease

## 2015-07-20 ENCOUNTER — Ambulatory Visit (HOSPITAL_COMMUNITY): Payer: Medicare Other | Admitting: Nurse Practitioner

## 2015-07-27 ENCOUNTER — Other Ambulatory Visit: Payer: Self-pay | Admitting: Cardiovascular Disease

## 2015-07-27 NOTE — Telephone Encounter (Signed)
Rx has been sent to the pharmacy electronically. ° °

## 2015-08-03 ENCOUNTER — Encounter: Payer: Self-pay | Admitting: Internal Medicine

## 2015-08-03 ENCOUNTER — Other Ambulatory Visit (INDEPENDENT_AMBULATORY_CARE_PROVIDER_SITE_OTHER): Payer: Medicare Other

## 2015-08-03 ENCOUNTER — Ambulatory Visit (INDEPENDENT_AMBULATORY_CARE_PROVIDER_SITE_OTHER): Payer: Medicare Other | Admitting: Internal Medicine

## 2015-08-03 VITALS — BP 112/72 | HR 68 | Temp 98.3°F | Resp 20 | Ht 62.0 in | Wt 194.0 lb

## 2015-08-03 DIAGNOSIS — E1159 Type 2 diabetes mellitus with other circulatory complications: Secondary | ICD-10-CM | POA: Diagnosis not present

## 2015-08-03 DIAGNOSIS — I10 Essential (primary) hypertension: Secondary | ICD-10-CM

## 2015-08-03 DIAGNOSIS — R6889 Other general symptoms and signs: Secondary | ICD-10-CM

## 2015-08-03 DIAGNOSIS — Z23 Encounter for immunization: Secondary | ICD-10-CM

## 2015-08-03 DIAGNOSIS — J209 Acute bronchitis, unspecified: Secondary | ICD-10-CM

## 2015-08-03 LAB — MICROALBUMIN / CREATININE URINE RATIO
Creatinine,U: 19.7 mg/dL
Microalb Creat Ratio: 65.5 mg/g — ABNORMAL HIGH (ref 0.0–30.0)
Microalb, Ur: 12.9 mg/dL — ABNORMAL HIGH (ref 0.0–1.9)

## 2015-08-03 LAB — BASIC METABOLIC PANEL
BUN: 16 mg/dL (ref 6–23)
CO2: 27 mEq/L (ref 19–32)
Calcium: 9.4 mg/dL (ref 8.4–10.5)
Chloride: 100 mEq/L (ref 96–112)
Creatinine, Ser: 0.74 mg/dL (ref 0.40–1.20)
GFR: 83.26 mL/min (ref >60.00)
Glucose, Bld: 201 mg/dL — ABNORMAL HIGH (ref 70–99)
Potassium: 4.1 mEq/L (ref 3.5–5.1)
Sodium: 136 mEq/L (ref 135–145)

## 2015-08-03 LAB — HEMOGLOBIN A1C: Hgb A1c MFr Bld: 6.8 % — ABNORMAL HIGH (ref 4.6–6.5)

## 2015-08-03 LAB — TSH: TSH: 1.73 u[IU]/mL (ref 0.35–4.50)

## 2015-08-03 MED ORDER — AMOXICILLIN 500 MG PO CAPS: 500.0000 mg | ORAL_CAPSULE | Freq: Three times a day (TID) | ORAL | Status: DC

## 2015-08-03 NOTE — Assessment & Plan Note (Signed)
A1c , urine microalbumin, BMET 

## 2015-08-03 NOTE — Assessment & Plan Note (Signed)
Blood pressure goals reviewed. BMET 

## 2015-08-03 NOTE — Patient Instructions (Signed)
  Your next office appointment will be determined based upon review of your pending labs  and  xrays  Those written interpretation of the lab results and instructions will be transmitted to you by My Chart   Critical results will be called.   Followup as needed for any active or acute issue. Please report any significant change in your symptoms. 

## 2015-08-03 NOTE — Progress Notes (Signed)
   Subjective:    Patient ID: [name], female    DOB: 30-Sep-1950, 64 y.o.   MRN: [id number]  HPI The patient is here to assess status of active health conditions.  Acute issues include productive cough with scant yellow sputum associated with some slight shortness of breath. She denies associated upper respiratory tract infection symptoms.  PMH, FH, & Social History reviewed & updated.No change in [place] as recorded.   She has been compliant with her medications. She questions whether gabapentin may have affected her hair. She feels her hair is thinner.  She walks 3 times a week 1-2 blocks. She does have slight exertional dyspnea with steps or inclines. She also has palpitations. She has discussed this with Dr. Gwenlyn Found, her Cardiologist. She has a follow-up next month in atrial fibrillation clinic. She describes some postural symptoms as well.  She does have some cold intolerance in addition to the hair changes.  Colonoscopy is up-to-date. She has no GI symptoms  Ophthalmologic exams up-to-date. She had no retinopathy: She sees Dr. Julious Oka.. Fasting blood sugars range 119-? high (list not brought in).. Post prandial glucose are less than 175. She has no hypoglycemia.  Review of Systems  Chest pain,  tachycardia,  paroxysmal nocturnal dyspnea, claudication or edema are absent. No unexplained weight loss, abdominal pain, significant dyspepsia, dysphagia, melena, rectal bleeding, or persistently small caliber stools. Dysuria, pyuria, hematuria, frequency, nocturia or polyuria are denied. No skin or nail changes present. No bowel changes of constipation or diarrhea.     Objective:   Physical Exam  Pertinent or positive findings include:She has bilateral ptosis. A grade 1 systolic murmur is present with radiation into the left carotid. There is dullness to percussion over the right upper quadrant. Striae are present over the lower abdomen. The great toenails are absent.   General appearance  :adequately nourished; in no distress.  Eyes: No conjunctival inflammation or scleral icterus is present.  Oral exam:  Lips and gums are healthy appearing.There is no oropharyngeal erythema or exudate noted. Dental hygiene is good.  Heart:  Normal rate and regular rhythm. S1 and S2 normal without gallop, click, rub or other extra sounds    Lungs:Chest clear to auscultation; no wheezes, rhonchi,rales ,or rubs present.No increased work of breathing.   Abdomen: bowel sounds normal, soft and non-tender without masses, organomegaly or hernias noted.  No guarding or rebound.   Vascular : all pulses equal ; no bruits present.  Skin:Warm & dry.  Intact without suspicious lesions or rashes ; no tenting or jaundice   Lymphatic: No lymphadenopathy is noted about the head, neck, axilla  Neuro: Strength, tone & DTRs normal.     Assessment & Plan:  See Current Assessment & Plan in Problem List under specific Diagnosis

## 2015-08-03 NOTE — Progress Notes (Signed)
Pre visit review using our clinic review tool, if applicable. No additional management support is needed unless otherwise documented below in the visit note. 

## 2015-08-11 ENCOUNTER — Encounter: Payer: Self-pay | Admitting: Internal Medicine

## 2015-08-12 ENCOUNTER — Other Ambulatory Visit: Payer: Self-pay

## 2015-08-12 ENCOUNTER — Other Ambulatory Visit: Payer: Self-pay | Admitting: Internal Medicine

## 2015-08-12 DIAGNOSIS — J209 Acute bronchitis, unspecified: Secondary | ICD-10-CM

## 2015-08-12 MED ORDER — AMOXICILLIN 500 MG PO CAPS: 500.0000 mg | ORAL_CAPSULE | Freq: Three times a day (TID) | ORAL | Status: DC

## 2015-08-12 MED ORDER — GABAPENTIN 100 MG PO CAPS: ORAL_CAPSULE | ORAL | Status: DC

## 2015-08-12 NOTE — Telephone Encounter (Signed)
RX faxed to pharm  

## 2015-08-28 ENCOUNTER — Other Ambulatory Visit: Payer: Self-pay | Admitting: Cardiovascular Disease

## 2015-08-29 NOTE — Telephone Encounter (Signed)
REFILL 

## 2015-09-26 ENCOUNTER — Ambulatory Visit (HOSPITAL_COMMUNITY)
Admission: RE | Admit: 2015-09-26 | Discharge: 2015-09-26 | Disposition: A | Discharge status: Home / Self Care | Payer: Medicare Other | Source: Ambulatory Visit | Attending: Nurse Practitioner | Admitting: Nurse Practitioner

## 2015-09-26 VITALS — BP 136/74 | HR 91 | Ht 62.0 in | Wt 195.0 lb

## 2015-09-26 DIAGNOSIS — Z955 Presence of coronary angioplasty implant and graft: Secondary | ICD-10-CM | POA: Insufficient documentation

## 2015-09-26 DIAGNOSIS — Z6835 Body mass index (BMI) 35.0-35.9, adult: Secondary | ICD-10-CM | POA: Diagnosis not present

## 2015-09-26 DIAGNOSIS — J449 Chronic obstructive pulmonary disease, unspecified: Secondary | ICD-10-CM | POA: Insufficient documentation

## 2015-09-26 DIAGNOSIS — E669 Obesity, unspecified: Secondary | ICD-10-CM | POA: Diagnosis not present

## 2015-09-26 DIAGNOSIS — Z79899 Other long term (current) drug therapy: Secondary | ICD-10-CM | POA: Insufficient documentation

## 2015-09-26 DIAGNOSIS — J45909 Unspecified asthma, uncomplicated: Secondary | ICD-10-CM | POA: Insufficient documentation

## 2015-09-26 DIAGNOSIS — I2510 Atherosclerotic heart disease of native coronary artery without angina pectoris: Secondary | ICD-10-CM | POA: Diagnosis not present

## 2015-09-26 DIAGNOSIS — E119 Type 2 diabetes mellitus without complications: Secondary | ICD-10-CM | POA: Diagnosis not present

## 2015-09-26 DIAGNOSIS — Z8249 Family history of ischemic heart disease and other diseases of the circulatory system: Secondary | ICD-10-CM | POA: Insufficient documentation

## 2015-09-26 DIAGNOSIS — I10 Essential (primary) hypertension: Secondary | ICD-10-CM | POA: Insufficient documentation

## 2015-09-26 DIAGNOSIS — R0683 Snoring: Secondary | ICD-10-CM | POA: Insufficient documentation

## 2015-09-26 DIAGNOSIS — I4819 Other persistent atrial fibrillation: Secondary | ICD-10-CM

## 2015-09-26 DIAGNOSIS — K219 Gastro-esophageal reflux disease without esophagitis: Secondary | ICD-10-CM | POA: Insufficient documentation

## 2015-09-26 DIAGNOSIS — Z888 Allergy status to other drugs, medicaments and biological substances status: Secondary | ICD-10-CM | POA: Diagnosis not present

## 2015-09-26 DIAGNOSIS — G8929 Other chronic pain: Secondary | ICD-10-CM | POA: Diagnosis not present

## 2015-09-26 DIAGNOSIS — Z87891 Personal history of nicotine dependence: Secondary | ICD-10-CM | POA: Insufficient documentation

## 2015-09-26 DIAGNOSIS — I481 Persistent atrial fibrillation: Secondary | ICD-10-CM | POA: Diagnosis present

## 2015-09-26 DIAGNOSIS — Z7902 Long term (current) use of antithrombotics/antiplatelets: Secondary | ICD-10-CM | POA: Insufficient documentation

## 2015-09-26 DIAGNOSIS — M4800 Spinal stenosis, site unspecified: Secondary | ICD-10-CM | POA: Insufficient documentation

## 2015-09-26 DIAGNOSIS — E785 Hyperlipidemia, unspecified: Secondary | ICD-10-CM | POA: Diagnosis not present

## 2015-09-26 DIAGNOSIS — Z91041 Radiographic dye allergy status: Secondary | ICD-10-CM | POA: Diagnosis not present

## 2015-09-26 DIAGNOSIS — M545 Low back pain: Secondary | ICD-10-CM | POA: Insufficient documentation

## 2015-09-26 LAB — BASIC METABOLIC PANEL
Anion gap: 12 (ref 5–15)
BUN: 20 mg/dL (ref 6–20)
CO2: 21 mmol/L — ABNORMAL LOW (ref 22–32)
Calcium: 9.3 mg/dL (ref 8.9–10.3)
Chloride: 98 mmol/L — ABNORMAL LOW (ref 101–111)
Creatinine, Ser: 0.8 mg/dL (ref 0.44–1.00)
GFR calc Af Amer: >60 mL/min (ref >60)
GFR calc non Af Amer: >60 mL/min (ref >60)
Glucose, Bld: 193 mg/dL — ABNORMAL HIGH (ref 65–99)
Potassium: 4.8 mmol/L (ref 3.5–5.1)
Sodium: 131 mmol/L — ABNORMAL LOW (ref 135–145)

## 2015-09-26 LAB — CBC
HCT: 36 % (ref 36.0–46.0)
Hemoglobin: 12.1 g/dL (ref 12.0–15.0)
MCH: 25.3 pg — ABNORMAL LOW (ref 26.0–34.0)
MCHC: 33.6 g/dL (ref 30.0–36.0)
MCV: 75.2 fL — ABNORMAL LOW (ref 78.0–100.0)
Platelets: 285 10*3/uL (ref 150–400)
RBC: 4.79 MIL/uL (ref 3.87–5.11)
RDW: 18.9 % — ABNORMAL HIGH (ref 11.5–15.5)
WBC: 7.4 10*3/uL (ref 4.0–10.5)

## 2015-09-26 NOTE — Patient Instructions (Signed)
Cardioversion scheduled for Friday, February 17th  - Arrive at the [facility] and go to admitting at 12PM  -Do not eat or drink anything after midnight the night prior to your procedure.  - Take all your medication with a sip of water prior to arrival.  - You will not be able to drive home after your procedure.

## 2015-09-27 ENCOUNTER — Encounter (HOSPITAL_COMMUNITY): Payer: Self-pay | Admitting: Nurse Practitioner

## 2015-09-27 NOTE — Progress Notes (Addendum)
Patient ID: [name], female   DOB: 1951-01-29, 65 y.o.   MRN: [id number]     Primary Care Physician: Janice Cobble, MD Referring Physician: Dr. Ronald Morrison [name] is a 65 y.o. female with a h/o CAD, DM, HTN, obesity, PAF first diagnosed May of 2015 with successful DCCV x2, first in May 2015 and again in September 2016.She  Recently had an AF episode and metoprolol succinate was increased to 150 mg qd. She converted on this dose and has been maintaining SR.  No previous AAD. Chads vasc score of at least 5, on xarelto daily.   On last visit in the afib clinic, we discussed lifestyle triggers. Pt does not  smoke or drink alcohol.Her BP/Hgba1c is controlled. She is sedentary. She does snore, no previous sleep study. Sleep study is pending. We also discussed antiarrythmic, which she was not interested in at that time.  She saw Dr. Gwenlyn Morrison one week ago and was in afib. Amlodipine was stopped and cardizem was started. She converted within the day. Discussed AAD therapy  again, but she is still not interested since she is back in rhythm and would prefer to take a wait and see approach.  She is in the afib clinic 2/13 with c/o being in afib x 2 weeks. Symptomatic with fatigue and dyspnea with exertion. We discussed options to restore SR. She is not interested in antiarrythmic's at this time, would like a cardioversion. Last one held her in rhythm since 5/16. If she has ERAF, she will consider AAD.  Today, she denies symptoms of palpitations, chest pain, shortness of breath, orthopnea, PND, lower extremity edema, dizziness, presyncope, syncope, or neurologic sequela. The patient is tolerating medications without difficulties and is otherwise without complaint today.   Past Medical History  Diagnosis Date  . Arthritis   . Asthma   . GERD (gastroesophageal reflux disease)   . Heart murmur   . Hyperlipidemia   . Hypertension   . Pneumonia 2008  . Goiter   . Diabetes mellitus     Type 2  .  Anemia, unspecified   . Chronic low back pain   . Polycystic ovary disease     Hysterectomy in 1982 for this  . Bell's palsy     Facial nerve decompression in 2001  . Shortness of breath dyspnea     ECHO 02/26/08 showed only minor abnormalities  . Coronary artery disease     Myoview 04/12/11 was entirely normal. ECHO 02/26/08 showed only minor abnormalities. Stenting 05/26/08 of her posterolateral branch to the left circumflex coronary artery. Used a 2.5x80mm Taxus Monorail stent.myoview 2014 was without ischemia  . COPD with asthma ([place])   . Glaucoma   . Spinal stenosis   . History of nuclear stress test 2012; 2014    lexiscan; normal pattern of perfusion; normal, low risk scan   . Atrial fibrillation with RVR ([name])     a. on Xarelto   Past Surgical History  Procedure Laterality Date  . Abdominal hysterectomy  1982    & BSO; for polycystic ovary disease  . Facial nerve decompression  2001/2002    bells palsy   . Laparoscopic cholecystectomy  06/15/2011     Dr [name]  . Colonoscopy      last 2009; Dr [name]; due 2019  . Upper gi endoscopy  2009    negative  . Coronary angioplasty  05/26/2008    Stenting of her posterolateral branch to the left circumflex coronary artery. Used  a 2.5x27mm Taxus Monorail stent.  . Transthoracic echocardiogram  123456    LV systolic function normal with mild conc LVH; LA mildly dilated; trace MR/TR  . Tee without cardioversion N/A 12/17/2013    Procedure: TRANSESOPHAGEAL ECHOCARDIOGRAM (TEE);  Surgeon: Janice Casino, MD;  [facility];  Service: Cardiovascular;  Laterality: N/A;  trish/ja  . Cardioversion N/A 12/17/2013    Procedure: CARDIOVERSION;  Surgeon: Janice Casino, MD;  Location: [facility] ENDOSCOPY;  Service: Cardiovascular;  Laterality: N/A;    Current Outpatient Prescriptions  Medication Sig Dispense Refill  . ascorbic acid (VITAMIN C) 1000 MG tablet Take 1,000 mg by mouth 2 (two) times daily.     . budesonide-formoterol  (SYMBICORT) 160-4.5 MCG/ACT inhaler Inhale 2 puffs into the lungs 2 (two) times daily. (Patient taking differently: Inhale 2 puffs into the lungs 2 (two) times daily as needed. ) 1 Inhaler 3  . CALCIUM-MAG-VIT C-VIT D PO Take 2 tablets by mouth daily.     . celecoxib (CELEBREX) 200 MG capsule Take 200 mg by mouth 2 (two) times daily.     [name] diltiazem (CARDIZEM CD) 180 MG 24 hr capsule Take 1 capsule (180 mg total) by mouth daily. 30 capsule 6  . estradiol (ESTRACE) 0.5 MG tablet Take 0.5 mg by mouth daily.     [name] gabapentin (NEURONTIN) 100 MG capsule TAKE 1 CAPSULE 3 TIMES DAILY X 72 HOURS.MAY INCREASE BY 1 CAPSULE PERDOSE EVERY 72 HRS. 180 capsule 0  . guaiFENesin (MUCINEX) 600 MG 12 hr tablet Take 600 mg by mouth 2 (two) times daily.    . INVOKANA 100 MG TABS tablet TAKE 1 TABLET ONCE DAILY. 90 tablet 3  . JANUMET 50-1000 MG tablet TAKE 1/2 TABLET IN THE MORNING WITH BREAKFAST AND 1 TABLET IN THE EVENING WITH SUPPER. 135 tablet 1  . latanoprost (XALATAN) 0.005 % ophthalmic solution Place 1 drop into both eyes at bedtime.    [name] LORazepam (ATIVAN) 0.5 MG tablet Take 1 mg by mouth at bedtime.     . Melatonin 5 MG TABS Take 1 tablet by mouth at bedtime.     . metoprolol succinate (TOPROL-XL) 50 MG 24 hr tablet Take 3 tablets (150 mg total) by mouth daily. 90 tablet 6  . Multiple Vitamins-Iron (MULTIVITAMINS WITH IRON) TABS Take 1 tablet by mouth 2 (two) times daily.     [name] omega-3 acid ethyl esters (LOVAZA) 1 G capsule Take 1 g by mouth daily.     [name] omeprazole (PRILOSEC) 20 MG capsule Take 20 mg by mouth daily.     Glory Rosebush DELICA LANCETS FINE MISC 1 Units by Does not apply route as directed. 100 each 3  . PARoxetine (PAXIL) 10 MG tablet Take 25 mg by mouth daily.     . quinapril (ACCUPRIL) 40 MG tablet Take 1 tablet (40 mg total) by mouth daily. 30 tablet 5  . rosuvastatin (CRESTOR) 10 MG tablet Take 1 tablet (10 mg total) by mouth at bedtime. 30 tablet 6  . senna (SENOKOT) 8.6 MG TABS Take 1 tablet  by mouth daily.     Alveda Reasons 20 MG TABS tablet TAKE 1 TABLET EACH DAY. 30 tablet 9  . isosorbide mononitrate (IMDUR) 30 MG 24 hr tablet Take 1 tablet (30 mg total) by mouth daily. NEED OV. (Patient not taking: Reported on 09/26/2015) 30 tablet 0  . levalbuterol (XOPENEX HFA) 45 MCG/ACT inhaler Inhale 1-2 puffs into the lungs every 6 (six) hours as needed for wheezing. (Patient  not taking: Reported on 09/26/2015) 1 Inhaler 1  . levalbuterol (XOPENEX) 0.63 MG/3ML nebulizer solution Take 3 mLs (0.63 mg total) by nebulization every 6 (six) hours as needed for wheezing or shortness of breath. (Patient not taking: Reported on 09/26/2015) 75 mL 1  . metoprolol tartrate (LOPRESSOR) 25 MG tablet Take 1 Tablet AS NEEDED for Irregular Heart Rate (Patient not taking: Reported on 09/26/2015) 15 tablet 1  . traMADol (ULTRAM) 50 MG tablet Take 1 tablet (50 mg total) by mouth every 6 (six) hours as needed. 1 q 8 hrs prn (Patient not taking: Reported on 09/26/2015) 30 tablet 1   No current facility-administered medications for this encounter.    Allergies  Allergen Reactions  . Benadryl [Diphenhydramine Hcl] Other (See Comments)    Restless leg  . Clopidogrel Bisulfate Nausea Only    Nausea & pain  . Contrast Media [Iodinated Diagnostic Agents] Palpitations    Rapid heart rate, hot    Social History   Social History  . Marital Status: Married    Spouse Name: N/A  . Number of Children: 2  . Years of Education: master's   Occupational History  . Speech Pathologist    Social History Main Topics  . Smoking status: Former Smoker -- 1.50 packs/day for 30 years    Types: Cigarettes    Quit date: 08/13/1993  . Smokeless tobacco: Never Used  . Alcohol Use: No  . Drug Use: No  . Sexual Activity: Not on file   Other Topics Concern  . Not on file   Social History Narrative    Family History  Problem Relation Age of Onset  . Heart attack Father 33    2nd MI at 64  . Colon cancer Brother 47  . Gout  Brother   . Diabetes Neg Hx   . Stroke Neg Hx   . Ulcers Mother   . Emphysema Mother 74  . Colon polyps Sister   . Cancer Sister     Basal cell carcinoma  . Pneumonia Maternal Grandmother   . Hypertension Brother   . Hyperlipidemia Brother   . Cancer Brother     Skin    ROS- All systems are reviewed and negative except as per the HPI above  Physical Exam: Filed Vitals:   09/26/15 1450  BP: 136/74  Pulse: 91  Height: 5\' 2"  (1.575 m)  Weight: 195 lb (88.451 kg)    GEN- The patient is well appearing, alert and oriented x 3 today.   Head- normocephalic, atraumatic Eyes-  Sclera clear, conjunctiva pink Ears- hearing intact Oropharynx- clear Neck- supple, no JVP Lymph- no cervical lymphadenopathy Lungs- Clear to ausculation bilaterally, normal work of breathing Heart- Regular rate and rhythm, no murmurs, rubs or gallops, PMI not laterally displaced GI- soft, NT, ND, + BS Extremities- no clubbing, cyanosis, or edema MS- no significant deformity or atrophy Skin- no rash or lesion Psych- euthymic mood, full affect Neuro- strength and sensation are intact  EKG- afib with v rate of 91 bpm, qrs int 82 ms, qtc 435 ms Epic records reviewed  Echo- TEE- 12/17/13- Left ventricle: The cavity size was normal. There was mild concentric hypertrophy. Systolic function was normal. The estimated ejection fraction was in the range of 55% to 60%. Wall motion was normal; there were no regional wall motion abnormalities. - Aortic valve: No evidence of vegetation. - Aorta: The aorta was normal, not dilated, and non-diseased. - Mitral valve: No evidence of vegetation. Trivial regurgitation. - Left atrium:  No evidence of thrombus in the atrial cavity or appendage. - Right atrium: No evidence of thrombus in the atrial cavity or appendage. - Atrial septum: There was a patent foramen ovale. - Tricuspid valve: No evidence of vegetation. Trivial regurgitation. - Pulmonic  valve: No evidence of vegetation.  Surface echo-02/26/08- Left atrium mildly dilated   Assessment and Plan:  1. Symptomatic persistent afib Options to manage afib were discussed with pt, including continuation of  current management, AAD such as multaq, sotalol, amiodarone or tikosyn. 1c agents would not apply due to presence of CAD. Ablation not an option unless she fails at lest one antiarrythmic.  For now, pt wants to try cardioversion and avoid AAD's if possible. Continue xarelto, states no missed doses in the last 3 weeks. Bmet, cbc   2. Lifestyle risk factors Pt states 40 lb weight loss and congratulated on her success and to continue with efforts Has spinal stenosis and does not exercise Encouraged to water walk or try recumbent bike which the pt thinks she can do without pain.  3. Snoring/possible sleep apnea Pt deferred study in past  4. HTN stable  F/u in one week after cardioversion  Butch Penny C. Winn Muehl, [facility] [address] [place], [place] [address] [phone]

## 2015-09-27 NOTE — Addendum Note (Signed)
Encounter addended by: Sherran Needs, NP on: 09/27/2015  9:05 AM<BR>     Documentation filed: Notes Section

## 2015-09-30 ENCOUNTER — Encounter (HOSPITAL_COMMUNITY): Payer: Self-pay | Admitting: *Deleted

## 2015-09-30 ENCOUNTER — Encounter (HOSPITAL_COMMUNITY)
Admission: RE | Disposition: A | Discharge status: Home / Self Care | Payer: Self-pay | Source: Ambulatory Visit | Attending: Cardiology

## 2015-09-30 ENCOUNTER — Ambulatory Visit (HOSPITAL_COMMUNITY)
Admission: RE | Admit: 2015-09-30 | Discharge: 2015-09-30 | Disposition: A | Discharge status: Home / Self Care | Payer: Medicare Other | Source: Ambulatory Visit | Attending: Nurse Practitioner | Admitting: Nurse Practitioner

## 2015-09-30 ENCOUNTER — Ambulatory Visit (HOSPITAL_COMMUNITY): Payer: Medicare Other | Admitting: Anesthesiology

## 2015-09-30 ENCOUNTER — Ambulatory Visit (HOSPITAL_COMMUNITY)
Admission: RE | Admit: 2015-09-30 | Discharge: 2015-09-30 | Disposition: A | Discharge status: Home / Self Care | Payer: Medicare Other | Source: Ambulatory Visit | Attending: Cardiology | Admitting: Cardiology

## 2015-09-30 DIAGNOSIS — Z7901 Long term (current) use of anticoagulants: Secondary | ICD-10-CM | POA: Diagnosis not present

## 2015-09-30 DIAGNOSIS — Z6835 Body mass index (BMI) 35.0-35.9, adult: Secondary | ICD-10-CM | POA: Diagnosis not present

## 2015-09-30 DIAGNOSIS — Z87891 Personal history of nicotine dependence: Secondary | ICD-10-CM | POA: Diagnosis not present

## 2015-09-30 DIAGNOSIS — Z7951 Long term (current) use of inhaled steroids: Secondary | ICD-10-CM | POA: Insufficient documentation

## 2015-09-30 DIAGNOSIS — E785 Hyperlipidemia, unspecified: Secondary | ICD-10-CM | POA: Diagnosis not present

## 2015-09-30 DIAGNOSIS — I10 Essential (primary) hypertension: Secondary | ICD-10-CM | POA: Insufficient documentation

## 2015-09-30 DIAGNOSIS — I4891 Unspecified atrial fibrillation: Secondary | ICD-10-CM | POA: Diagnosis not present

## 2015-09-30 DIAGNOSIS — I2510 Atherosclerotic heart disease of native coronary artery without angina pectoris: Secondary | ICD-10-CM | POA: Diagnosis not present

## 2015-09-30 DIAGNOSIS — Z79890 Hormone replacement therapy (postmenopausal): Secondary | ICD-10-CM | POA: Insufficient documentation

## 2015-09-30 DIAGNOSIS — K219 Gastro-esophageal reflux disease without esophagitis: Secondary | ICD-10-CM | POA: Insufficient documentation

## 2015-09-30 DIAGNOSIS — J449 Chronic obstructive pulmonary disease, unspecified: Secondary | ICD-10-CM | POA: Diagnosis not present

## 2015-09-30 DIAGNOSIS — J45909 Unspecified asthma, uncomplicated: Secondary | ICD-10-CM | POA: Diagnosis not present

## 2015-09-30 DIAGNOSIS — I481 Persistent atrial fibrillation: Secondary | ICD-10-CM | POA: Insufficient documentation

## 2015-09-30 DIAGNOSIS — Z79899 Other long term (current) drug therapy: Secondary | ICD-10-CM | POA: Insufficient documentation

## 2015-09-30 DIAGNOSIS — E669 Obesity, unspecified: Secondary | ICD-10-CM | POA: Insufficient documentation

## 2015-09-30 DIAGNOSIS — E1151 Type 2 diabetes mellitus with diabetic peripheral angiopathy without gangrene: Secondary | ICD-10-CM | POA: Diagnosis not present

## 2015-09-30 DIAGNOSIS — Z7984 Long term (current) use of oral hypoglycemic drugs: Secondary | ICD-10-CM | POA: Insufficient documentation

## 2015-09-30 HISTORY — PX: CARDIOVERSION: SHX1299

## 2015-09-30 LAB — GLUCOSE, CAPILLARY: Glucose-Capillary: 156 mg/dL — ABNORMAL HIGH (ref 65–99)

## 2015-09-30 SURGERY — CARDIOVERSION
Anesthesia: General

## 2015-09-30 MED ORDER — LIDOCAINE HCL (CARDIAC) 20 MG/ML IV SOLN
INTRAVENOUS | Status: DC | PRN
  Administered 2015-09-30: 3 mL via INTRATRACHEAL

## 2015-09-30 MED ORDER — ONDANSETRON HCL 4 MG/2ML IJ SOLN
INTRAMUSCULAR | Status: DC | PRN
  Administered 2015-09-30: 60 mg via INTRAVENOUS

## 2015-09-30 MED ORDER — LACTATED RINGERS IV SOLN
INTRAVENOUS | Status: DC | PRN
  Administered 2015-09-30: 13:00:00 via INTRAVENOUS

## 2015-09-30 MED ORDER — PROPOFOL 10 MG/ML IV BOLUS
INTRAVENOUS | Status: DC | PRN
  Administered 2015-09-30: 50 mg via INTRAVENOUS

## 2015-09-30 NOTE — Transfer of Care (Signed)
Immediate Anesthesia Transfer of Care Note  Patient: [name]  Procedure(s) Performed: Procedure(s): CARDIOVERSION (N/A)  Patient Location: PACU and Endoscopy Unit  Anesthesia Type:MAC  Level of Consciousness: awake, alert , sedated and patient cooperative  Airway & Oxygen Therapy: Patient Spontanous Breathing and Patient connected to nasal cannula oxygen  Post-op Assessment: Report given to RN, Post -op Vital signs reviewed and stable, Patient moving all extremities and Patient moving all extremities X 4  Post vital signs: Reviewed and stable  Last Vitals:  Filed Vitals:   09/30/15 1237  BP: 122/64  Pulse: 83  Temp: 36.7 C  Resp: 95    Complications: No apparent anesthesia complications

## 2015-09-30 NOTE — Anesthesia Postprocedure Evaluation (Signed)
Anesthesia Post Note  Patient: DEAUDRA MCFAIL  Procedure(s) Performed: Procedure(s) (LRB): CARDIOVERSION (N/A)  Patient location during evaluation: PACU Anesthesia Type: MAC Level of consciousness: awake and alert Pain management: pain level controlled Vital Signs Assessment: post-procedure vital signs reviewed and stable Respiratory status: spontaneous breathing, nonlabored ventilation, respiratory function stable and patient connected to nasal cannula oxygen Cardiovascular status: stable and blood pressure returned to baseline Anesthetic complications: no    Last Vitals:  Filed Vitals:   09/30/15 1237 09/30/15 1313  BP: 122/64 94/37  Pulse: 83 55  Temp: 36.7 C 36.8 C  Resp: 95 12    Last Pain: There were no vitals filed for this visit.               Catalina Gravel

## 2015-09-30 NOTE — Anesthesia Preprocedure Evaluation (Addendum)
Anesthesia Evaluation  Patient identified by MRN, date of birth, ID band Patient awake    Reviewed: Allergy & Precautions, H&P , NPO status , Patient's Chart, lab work & pertinent test results  History of Anesthesia Complications Negative for: history of anesthetic complications  Airway Mallampati: II  TM Distance: >3 FB Neck ROM: Full    Dental  (+) Teeth Intact, Dental Advisory Given   Pulmonary shortness of breath, asthma , COPD, former smoker,    breath sounds clear to auscultation       Cardiovascular hypertension, Pt. on medications + angina + CAD and + Peripheral Vascular Disease  + dysrhythmias Atrial Fibrillation + Valvular Problems/Murmurs  Rhythm:Regular Rate:Normal     Neuro/Psych negative neurological ROS  negative psych ROS   GI/Hepatic Neg liver ROS, GERD  Controlled and Medicated,  Endo/Other  diabetes, Type 2, Oral Hypoglycemic AgentsObesity   Renal/GU negative Renal ROS  negative genitourinary   Musculoskeletal  (+) Arthritis , Osteoarthritis,    Abdominal   Peds  Hematology negative hematology ROS (+) anemia ,   Anesthesia Other Findings Day of surgery medications reviewed with the patient.  Reproductive/Obstetrics                            Anesthesia Physical  Anesthesia Plan  ASA: III  Anesthesia Plan: General   Post-op Pain Management:    Induction: Intravenous  Airway Management Planned: Mask  Additional Equipment:   Intra-op Plan:   Post-operative Plan:   Informed Consent: I have reviewed the patients History and Physical, chart, labs and discussed the procedure including the risks, benefits and alternatives for the proposed anesthesia with the patient or authorized representative who has indicated his/her understanding and acceptance.   Dental advisory given  Plan Discussed with: CRNA  Anesthesia Plan Comments:         Anesthesia Quick  Evaluation

## 2015-09-30 NOTE — H&P (View-Only) (Signed)
Patient ID: SAKILE FILAR, female   DOB: 16-Mar-1951, 65 y.o.   MRN: [id number]     Primary Care Physician: Janice Cobble, MD Referring Physician: Dr. Ronald Lobo [name] is a 65 y.o. female with a h/o CAD, DM, HTN, obesity, PAF first diagnosed May of 2015 with successful DCCV x2, first in May 2015 and again in September 2016.She  Recently had an AF episode and metoprolol succinate was increased to 150 mg qd. She converted on this dose and has been maintaining SR.  No previous AAD. Chads vasc score of at least 5, on xarelto daily.   On last visit in the afib clinic, we discussed lifestyle triggers. Pt does not  smoke or drink alcohol.Her BP/Hgba1c is controlled. She is sedentary. She does snore, no previous sleep study. Sleep study is pending. We also discussed antiarrythmic, which she was not interested in at that time.  She saw Dr. Gwenlyn Found one week ago and was in afib. Amlodipine was stopped and cardizem was started. She converted within the day. Discussed AAD therapy  again, but she is still not interested since she is back in rhythm and would prefer to take a wait and see approach.  She is in the afib clinic 2/13 with c/o being in afib x 2 weeks. Symptomatic with fatigue and dyspnea with exertion. We discussed options to restore SR. She is not interested in antiarrythmic's at this time, would like a cardioversion. Last one held her in rhythm since 5/16. If she has ERAF, she will consider AAD.  Today, she denies symptoms of palpitations, chest pain, shortness of breath, orthopnea, PND, lower extremity edema, dizziness, presyncope, syncope, or neurologic sequela. The patient is tolerating medications without difficulties and is otherwise without complaint today.   Past Medical History  Diagnosis Date  . Arthritis   . Asthma   . GERD (gastroesophageal reflux disease)   . Heart murmur   . Hyperlipidemia   . Hypertension   . Pneumonia 2008  . Goiter   . Diabetes mellitus     Type 2  .  Anemia, unspecified   . Chronic low back pain   . Polycystic ovary disease     Hysterectomy in 1982 for this  . Bell's palsy     Facial nerve decompression in 2001  . Shortness of breath dyspnea     ECHO 02/26/08 showed only minor abnormalities  . Coronary artery disease     Myoview 04/12/11 was entirely normal. ECHO 02/26/08 showed only minor abnormalities. Stenting 05/26/08 of her posterolateral branch to the left circumflex coronary artery. Used a 2.5x65mm Taxus Monorail stent.myoview 2014 was without ischemia  . COPD with asthma ([place])   . Glaucoma   . Spinal stenosis   . History of nuclear stress test 2012; 2014    lexiscan; normal pattern of perfusion; normal, low risk scan   . Atrial fibrillation with RVR ([place])     a. on Xarelto   Past Surgical History  Procedure Laterality Date  . Abdominal hysterectomy  1982    & BSO; for polycystic ovary disease  . Facial nerve decompression  2001/2002    bells palsy   . Laparoscopic cholecystectomy  06/15/2011     Dr Dalbert Batman  . Colonoscopy      last 2009; Dr Cristina Gong; due 2019  . Upper gi endoscopy  2009    negative  . Coronary angioplasty  05/26/2008    Stenting of her posterolateral branch to the left circumflex coronary artery. Used  a 2.5x63mm Taxus Monorail stent.  . Transthoracic echocardiogram  123456    LV systolic function normal with mild conc LVH; LA mildly dilated; trace MR/TR  . Tee without cardioversion N/A 12/17/2013    Procedure: TRANSESOPHAGEAL ECHOCARDIOGRAM (TEE);  Surgeon: Pixie Casino, MD;  Location: [facility] ENDOSCOPY;  Service: Cardiovascular;  Laterality: N/A;  trish/ja  . Cardioversion N/A 12/17/2013    Procedure: CARDIOVERSION;  Surgeon: Pixie Casino, MD;  Location: [facility] ENDOSCOPY;  Service: Cardiovascular;  Laterality: N/A;    Current Outpatient Prescriptions  Medication Sig Dispense Refill  . ascorbic acid (VITAMIN C) 1000 MG tablet Take 1,000 mg by mouth 2 (two) times daily.     . budesonide-formoterol  (SYMBICORT) 160-4.5 MCG/ACT inhaler Inhale 2 puffs into the lungs 2 (two) times daily. (Patient taking differently: Inhale 2 puffs into the lungs 2 (two) times daily as needed. ) 1 Inhaler 3  . CALCIUM-MAG-VIT C-VIT D PO Take 2 tablets by mouth daily.     . celecoxib (CELEBREX) 200 MG capsule Take 200 mg by mouth 2 (two) times daily.     [name] diltiazem (CARDIZEM CD) 180 MG 24 hr capsule Take 1 capsule (180 mg total) by mouth daily. 30 capsule 6  . estradiol (ESTRACE) 0.5 MG tablet Take 0.5 mg by mouth daily.     [name] gabapentin (NEURONTIN) 100 MG capsule TAKE 1 CAPSULE 3 TIMES DAILY X 72 HOURS.MAY INCREASE BY 1 CAPSULE PERDOSE EVERY 72 HRS. 180 capsule 0  . guaiFENesin (MUCINEX) 600 MG 12 hr tablet Take 600 mg by mouth 2 (two) times daily.    . INVOKANA 100 MG TABS tablet TAKE 1 TABLET ONCE DAILY. 90 tablet 3  . JANUMET 50-1000 MG tablet TAKE 1/2 TABLET IN THE MORNING WITH BREAKFAST AND 1 TABLET IN THE EVENING WITH SUPPER. 135 tablet 1  . latanoprost (XALATAN) 0.005 % ophthalmic solution Place 1 drop into both eyes at bedtime.    [name] LORazepam (ATIVAN) 0.5 MG tablet Take 1 mg by mouth at bedtime.     . Melatonin 5 MG TABS Take 1 tablet by mouth at bedtime.     . metoprolol succinate (TOPROL-XL) 50 MG 24 hr tablet Take 3 tablets (150 mg total) by mouth daily. 90 tablet 6  . Multiple Vitamins-Iron (MULTIVITAMINS WITH IRON) TABS Take 1 tablet by mouth 2 (two) times daily.     [name] omega-3 acid ethyl esters (LOVAZA) 1 G capsule Take 1 g by mouth daily.     [name] omeprazole (PRILOSEC) 20 MG capsule Take 20 mg by mouth daily.     Glory Rosebush DELICA LANCETS FINE MISC 1 Units by Does not apply route as directed. 100 each 3  . PARoxetine (PAXIL) 10 MG tablet Take 25 mg by mouth daily.     . quinapril (ACCUPRIL) 40 MG tablet Take 1 tablet (40 mg total) by mouth daily. 30 tablet 5  . rosuvastatin (CRESTOR) 10 MG tablet Take 1 tablet (10 mg total) by mouth at bedtime. 30 tablet 6  . senna (SENOKOT) 8.6 MG TABS Take 1 tablet  by mouth daily.     Alveda Reasons 20 MG TABS tablet TAKE 1 TABLET EACH DAY. 30 tablet 9  . isosorbide mononitrate (IMDUR) 30 MG 24 hr tablet Take 1 tablet (30 mg total) by mouth daily. NEED OV. (Patient not taking: Reported on 09/26/2015) 30 tablet 0  . levalbuterol (XOPENEX HFA) 45 MCG/ACT inhaler Inhale 1-2 puffs into the lungs every 6 (six) hours as needed for wheezing. (Patient  not taking: Reported on 09/26/2015) 1 Inhaler 1  . levalbuterol (XOPENEX) 0.63 MG/3ML nebulizer solution Take 3 mLs (0.63 mg total) by nebulization every 6 (six) hours as needed for wheezing or shortness of breath. (Patient not taking: Reported on 09/26/2015) 75 mL 1  . metoprolol tartrate (LOPRESSOR) 25 MG tablet Take 1 Tablet AS NEEDED for Irregular Heart Rate (Patient not taking: Reported on 09/26/2015) 15 tablet 1  . traMADol (ULTRAM) 50 MG tablet Take 1 tablet (50 mg total) by mouth every 6 (six) hours as needed. 1 q 8 hrs prn (Patient not taking: Reported on 09/26/2015) 30 tablet 1   No current facility-administered medications for this encounter.    Allergies  Allergen Reactions  . Benadryl [Diphenhydramine Hcl] Other (See Comments)    Restless leg  . Clopidogrel Bisulfate Nausea Only    Nausea & pain  . Contrast Media [Iodinated Diagnostic Agents] Palpitations    Rapid heart rate, hot    Social History   Social History  . Marital Status: Married    Spouse Name: N/A  . Number of Children: 2  . Years of Education: master's   Occupational History  . Speech Pathologist    Social History Main Topics  . Smoking status: Former Smoker -- 1.50 packs/day for 30 years    Types: Cigarettes    Quit date: 08/13/1993  . Smokeless tobacco: Never Used  . Alcohol Use: No  . Drug Use: No  . Sexual Activity: Not on file   Other Topics Concern  . Not on file   Social History Narrative    Family History  Problem Relation Age of Onset  . Heart attack Father 74    2nd MI at 19  . Colon cancer Brother 14  . Gout  Brother   . Diabetes Neg Hx   . Stroke Neg Hx   . Ulcers Mother   . Emphysema Mother 76  . Colon polyps Sister   . Cancer Sister     Basal cell carcinoma  . Pneumonia Maternal Grandmother   . Hypertension Brother   . Hyperlipidemia Brother   . Cancer Brother     Skin    ROS- All systems are reviewed and negative except as per the HPI above  Physical Exam: Filed Vitals:   09/26/15 1450  BP: 136/74  Pulse: 91  Height: 5\' 2"  (1.575 m)  Weight: 195 lb (88.451 kg)    GEN- The patient is well appearing, alert and oriented x 3 today.   Head- normocephalic, atraumatic Eyes-  Sclera clear, conjunctiva pink Ears- hearing intact Oropharynx- clear Neck- supple, no JVP Lymph- no cervical lymphadenopathy Lungs- Clear to ausculation bilaterally, normal work of breathing Heart- Regular rate and rhythm, no murmurs, rubs or gallops, PMI not laterally displaced GI- soft, NT, ND, + BS Extremities- no clubbing, cyanosis, or edema MS- no significant deformity or atrophy Skin- no rash or lesion Psych- euthymic mood, full affect Neuro- strength and sensation are intact  EKG- afib with v rate of 91 bpm, qrs int 82 ms, qtc 435 ms Epic records reviewed  Echo- TEE- 12/17/13- Left ventricle: The cavity size was normal. There was mild concentric hypertrophy. Systolic function was normal. The estimated ejection fraction was in the range of 55% to 60%. Wall motion was normal; there were no regional wall motion abnormalities. - Aortic valve: No evidence of vegetation. - Aorta: The aorta was normal, not dilated, and non-diseased. - Mitral valve: No evidence of vegetation. Trivial regurgitation. - Left atrium:  No evidence of thrombus in the atrial cavity or appendage. - Right atrium: No evidence of thrombus in the atrial cavity or appendage. - Atrial septum: There was a patent foramen ovale. - Tricuspid valve: No evidence of vegetation. Trivial regurgitation. - Pulmonic  valve: No evidence of vegetation.  Surface echo-02/26/08- Left atrium mildly dilated   Assessment and Plan:  1. Symptomatic persistent afib Options to manage afib were discussed with pt, including continuation of  current management, AAD such as multaq, sotalol, amiodarone or tikosyn. 1c agents would not apply due to presence of CAD. Ablation not an option unless she fails at lest one antiarrythmic.  For now, pt wants to try cardioversion and avoid AAD's if possible. Continue xarelto, states no missed doses in the last 3 weeks. Bmet, cbc   2. Lifestyle risk factors Pt states 40 lb weight loss and congratulated on her success and to continue with efforts Has spinal stenosis and does not exercise Encouraged to water walk or try recumbent bike which the pt thinks she can do without pain.  3. Snoring/possible sleep apnea Pt deferred study in past  4. HTN stable  F/u in one week after cardioversion  Butch Penny C. [name], [facility] [address] [place], [place] [address] [phone]

## 2015-09-30 NOTE — Progress Notes (Signed)
Patient here to confirm afib prior to going for cardioversion (pt request). Hooked up to telemetry and confirmed continued AFIB. Patient instructed to present for DCCV as scheduled.

## 2015-09-30 NOTE — CV Procedure (Signed)
DCC: Propofol 50 mg Lidocaine 60 mg Dr Jillyn Hidden  Single biphasic shock 120 J converted from afib rate 115 to NSR rate 70 No missed doses of xarelto  No immediate neurologic sequelae  F/U Afib Clinic [facility]

## 2015-09-30 NOTE — Interval H&P Note (Signed)
History and Physical Interval Note:  09/30/2015 12:11 PM  [name]  has presented today for surgery, with the diagnosis of AFIB  The various methods of treatment have been discussed with the patient and family. After consideration of risks, benefits and other options for treatment, the patient has consented to  Procedure(s): CARDIOVERSION (N/A) as a surgical intervention .  The patient's history has been reviewed, patient examined, no change in status, stable for surgery.  I have reviewed the patient's chart and labs.  Questions were answered to the patient's satisfaction.     Dorothy Spark

## 2015-09-30 NOTE — Discharge Instructions (Signed)
Monitored Anesthesia Care °Monitored anesthesia care is an anesthesia service for a medical procedure. Anesthesia is the loss of the ability to feel pain. It is produced by medicines called anesthetics. It may affect a small area of your body (local anesthesia), a large area of your body (regional anesthesia), or your entire body (general anesthesia). The need for monitored anesthesia care depends your procedure, your condition, and the potential need for regional or general anesthesia. It is often provided during procedures where:  °· General anesthesia may be needed if there are complications. This is because you need special care when you are under general anesthesia.   °· You will be under local or regional anesthesia. This is so that you are able to have higher levels of anesthesia if needed.   °· You will receive calming medicines (sedatives). This is especially the case if sedatives are given to put you in a semi-conscious state of relaxation (deep sedation). This is because the amount of sedative needed to produce this state can be hard to predict. Too much of a sedative can produce general anesthesia. °Monitored anesthesia care is performed by one or more health care providers who have special training in all types of anesthesia. You will need to meet with these health care providers before your procedure. During this meeting, they will ask you about your medical history. They will also give you instructions to follow. (For example, you will need to stop eating and drinking before your procedure. You may also need to stop or change medicines you are taking.) During your procedure, your health care providers will stay with you. They will:  °· Watch your condition. This includes watching your blood pressure, breathing, and level of pain.   °· Diagnose and treat problems that occur.   °· Give medicines if they are needed. These may include calming medicines (sedatives) and anesthetics.   °· Make sure you are  comfortable.   °Having monitored anesthesia care does not necessarily mean that you will be under anesthesia. It does mean that your health care providers will be able to manage anesthesia if you need it or if it occurs. It also means that you will be able to have a different type of anesthesia than you are having if you need it. When your procedure is complete, your health care providers will continue to watch your condition. They will make sure any medicines wear off before you are allowed to go home.  °  °This information is not intended to replace advice given to you by your health care provider. Make sure you discuss any questions you have with your health care provider. °  °Document Released: 04/25/2005 Document Revised: 08/20/2014 Document Reviewed: 09/10/2012 °Elsevier Interactive Patient Education ©2016 [employer] °Electrical Cardioversion, Care After °Refer to this sheet in the next few weeks. These instructions provide you with information on caring for yourself after your procedure. Your health care provider may also give you more specific instructions. Your treatment has been planned according to current medical practices, but problems sometimes occur. Call your health care provider if you have any problems or questions after your procedure. °WHAT TO EXPECT AFTER THE PROCEDURE °After your procedure, it is typical to have the following sensations: °· Some redness on the skin where the shocks were delivered. If this is tender, a sunburn lotion or hydrocortisone cream may help. °· Possible return of an abnormal heart rhythm within hours or days after the procedure. °HOME CARE INSTRUCTIONS °· Take medicines only as directed by your health care provider.   Be sure you understand how and when to take your medicine. °· Learn how to feel your pulse and check it often. °· Limit your activity for 48 hours after the procedure or as directed by your health care provider. °· Avoid or minimize caffeine and other  stimulants as directed by your health care provider. °SEEK MEDICAL CARE IF: °· You feel like your heart is beating too fast or your pulse is not regular. °· You have any questions about your medicines. °· You have bleeding that will not stop. °SEEK IMMEDIATE MEDICAL CARE IF: °· You are dizzy or feel faint. °· It is hard to breathe or you feel short of breath. °· There is a change in discomfort in your chest. °· Your speech is slurred or you have trouble moving an arm or leg on one side of your body. °· You get a serious muscle cramp that does not go away. °· Your fingers or toes turn cold or blue. °  °This information is not intended to replace advice given to you by your health care provider. Make sure you discuss any questions you have with your health care provider. °  °Document Released: 05/20/2013 Document Revised: 08/20/2014 Document Reviewed: 05/20/2013 °Elsevier Interactive Patient Education ©2016 [employer] ° °

## 2015-10-03 ENCOUNTER — Encounter (HOSPITAL_COMMUNITY): Payer: Self-pay | Admitting: Cardiology

## 2015-10-05 ENCOUNTER — Encounter (HOSPITAL_COMMUNITY): Payer: Self-pay | Admitting: Nurse Practitioner

## 2015-10-05 ENCOUNTER — Ambulatory Visit (HOSPITAL_COMMUNITY)
Admission: RE | Admit: 2015-10-05 | Discharge: 2015-10-05 | Disposition: A | Discharge status: Home / Self Care | Payer: Medicare Other | Source: Ambulatory Visit | Attending: Nurse Practitioner | Admitting: Nurse Practitioner

## 2015-10-05 DIAGNOSIS — R0602 Shortness of breath: Secondary | ICD-10-CM | POA: Insufficient documentation

## 2015-10-05 DIAGNOSIS — I4891 Unspecified atrial fibrillation: Secondary | ICD-10-CM | POA: Diagnosis not present

## 2015-10-05 DIAGNOSIS — R9431 Abnormal electrocardiogram [ECG] [EKG]: Secondary | ICD-10-CM | POA: Diagnosis not present

## 2015-10-05 NOTE — Progress Notes (Addendum)
Patient ID: [name], female   DOB: [birth year], 65 y.o.   MRN: [id number]     Primary Care Physician: Janice Koch, MD Referring Physician: Dr. Ronald Lobo [name] is a 65 y.o. female with a h/o CAD, DM, HTN, obesity, PAF first diagnosed May of 2015 with successful DCCV x2, first in May 2015 and again in September 2016.She  Recently had an AF episode and metoprolol succinate was increased to 150 mg qd. She converted on this dose and has been maintaining SR.  No previous AAD. Chads vasc score of at least 5, on xarelto daily.   On last visit in the afib clinic, we discussed lifestyle triggers. Pt does not  smoke or drink alcohol.Her BP/Hgba1c is controlled. She is sedentary. She does snore, no previous sleep study. Sleep study is pending. We also discussed antiarrythmic, which she was not interested in at that time.  She saw Dr. Gwenlyn Morrison one week ago and was in afib. Amlodipine was stopped and cardizem was started. She converted within the day. Discussed AAD therapy  again, but she is still not interested since she is back in rhythm and would prefer to take a wait and see approach.  She is in the afib clinic 2/13 with c/o being in afib x 2 weeks. Symptomatic with fatigue and dyspnea with exertion. We discussed options to restore SR. She is not interested in antiarrythmic's at this time, would like a cardioversion. Last one held her in rhythm since 5/16. If she has ERAF, she will consider AAD.  She returns today in SR but is surprised her energy is not much better in SR. She has a mild cough, but mostly fatigue. She has a sleep study, pending. She had a stress test last summer which was low risk. Has noticed some increase in BP.  Today, she denies symptoms of palpitations, chest pain, shortness of breath, orthopnea, PND, lower extremity edema, dizziness, presyncope, syncope, or neurologic sequela. The patient is tolerating medications without difficulties and is otherwise without  complaint today.   Past Medical History  Diagnosis Date  . Arthritis   . Asthma   . GERD (gastroesophageal reflux disease)   . Heart murmur   . Hyperlipidemia   . Hypertension   . Pneumonia 2008  . Goiter   . Diabetes mellitus     Type 2  . Anemia, unspecified   . Chronic low back pain   . Polycystic ovary disease     Hysterectomy in 1982 for this  . Bell's palsy     Facial nerve decompression in 2001  . Shortness of breath dyspnea     ECHO 02/26/08 showed only minor abnormalities  . Coronary artery disease     Myoview 04/12/11 was entirely normal. ECHO 02/26/08 showed only minor abnormalities. Stenting 05/26/08 of her posterolateral branch to the left circumflex coronary artery. Used a 2.5x51mm Taxus Monorail stent.myoview 2014 was without ischemia  . COPD with asthma ([place])   . Glaucoma   . Spinal stenosis   . History of nuclear stress test 2012; 2014    lexiscan; normal pattern of perfusion; normal, low risk scan   . Atrial fibrillation with RVR ([place])     a. on Xarelto   Past Surgical History  Procedure Laterality Date  . Abdominal hysterectomy  1982    & BSO; for polycystic ovary disease  . Facial nerve decompression  2001/2002    bells palsy   . Laparoscopic cholecystectomy  06/15/2011  Dr Dalbert Batman  . Colonoscopy      last 2009; Dr Cristina Gong; due 2019  . Upper gi endoscopy  2009    negative  . Coronary angioplasty  05/26/2008    Stenting of her posterolateral branch to the left circumflex coronary artery. Used a 2.5x29mm Taxus Monorail stent.  . Transthoracic echocardiogram  123456    LV systolic function normal with mild conc LVH; LA mildly dilated; trace MR/TR  . Tee without cardioversion N/A 12/17/2013    Procedure: TRANSESOPHAGEAL ECHOCARDIOGRAM (TEE);  Surgeon: Pixie Casino, MD;  Location: [facility] ENDOSCOPY;  Service: Cardiovascular;  Laterality: N/A;  trish/ja  . Cardioversion N/A 12/17/2013    Procedure: CARDIOVERSION;  Surgeon: Pixie Casino, MD;  Location:  [place];  Service: Cardiovascular;  Laterality: N/A;  . Cardioversion N/A 09/30/2015    Procedure: CARDIOVERSION;  Surgeon: Dorothy Spark, MD;  Location: [facility] ENDOSCOPY;  Service: Cardiovascular;  Laterality: N/A;    Current Outpatient Prescriptions  Medication Sig Dispense Refill  . amoxicillin (AMOXIL) 500 MG capsule Take 500 mg by mouth 3 (three) times daily.    [name] ascorbic acid (VITAMIN C) 1000 MG tablet Take 1,000 mg by mouth 2 (two) times daily.     . budesonide-formoterol (SYMBICORT) 160-4.5 MCG/ACT inhaler Inhale 2 puffs into the lungs 2 (two) times daily. (Patient taking differently: Inhale 2 puffs into the lungs 2 (two) times daily as needed. ) 1 Inhaler 3  . CALCIUM-MAG-VIT C-VIT D PO Take 2 tablets by mouth daily.     . celecoxib (CELEBREX) 200 MG capsule Take 200 mg by mouth 2 (two) times daily.     [name] diltiazem (CARDIZEM CD) 180 MG 24 hr capsule Take 1 capsule (180 mg total) by mouth daily. 30 capsule 6  . estradiol (ESTRACE) 0.5 MG tablet Take 0.5 mg by mouth daily.     [name] gabapentin (NEURONTIN) 100 MG capsule TAKE 1 CAPSULE 3 TIMES DAILY X 72 HOURS.MAY INCREASE BY 1 CAPSULE PERDOSE EVERY 72 HRS. 180 capsule 0  . guaiFENesin (MUCINEX) 600 MG 12 hr tablet Take 600 mg by mouth 2 (two) times daily.    . INVOKANA 100 MG TABS tablet TAKE 1 TABLET ONCE DAILY. 90 tablet 3  . isosorbide mononitrate (IMDUR) 30 MG 24 hr tablet Take 1 tablet (30 mg total) by mouth daily. NEED OV. 30 tablet 0  . JANUMET 50-1000 MG tablet TAKE 1/2 TABLET IN THE MORNING WITH BREAKFAST AND 1 TABLET IN THE EVENING WITH SUPPER. 135 tablet 1  . latanoprost (XALATAN) 0.005 % ophthalmic solution Place 1 drop into both eyes at bedtime.    . levalbuterol (XOPENEX HFA) 45 MCG/ACT inhaler Inhale 1-2 puffs into the lungs every 6 (six) hours as needed for wheezing. 1 Inhaler 1  . levalbuterol (XOPENEX) 0.63 MG/3ML nebulizer solution Take 3 mLs (0.63 mg total) by nebulization every 6 (six) hours as needed for wheezing or  shortness of breath. 75 mL 1  . LORazepam (ATIVAN) 0.5 MG tablet Take 1 mg by mouth at bedtime.     . Melatonin 5 MG TABS Take 1 tablet by mouth at bedtime.     . metoprolol succinate (TOPROL-XL) 50 MG 24 hr tablet Take 3 tablets (150 mg total) by mouth daily. 90 tablet 6  . metoprolol tartrate (LOPRESSOR) 25 MG tablet Take 1 Tablet AS NEEDED for Irregular Heart Rate 15 tablet 1  . Multiple Vitamins-Iron (MULTIVITAMINS WITH IRON) TABS Take 1 tablet by mouth 2 (two) times daily.     [name]  omega-3 acid ethyl esters (LOVAZA) 1 G capsule Take 1 g by mouth daily.     [name] omeprazole (PRILOSEC) 20 MG capsule Take 20 mg by mouth daily.     Glory Rosebush DELICA LANCETS FINE MISC 1 Units by Does not apply route as directed. 100 each 3  . PARoxetine (PAXIL) 10 MG tablet Take 25 mg by mouth daily.     . quinapril (ACCUPRIL) 40 MG tablet Take 1 tablet (40 mg total) by mouth daily. 30 tablet 5  . rosuvastatin (CRESTOR) 10 MG tablet Take 1 tablet (10 mg total) by mouth at bedtime. 30 tablet 6  . senna (SENOKOT) 8.6 MG TABS Take 1 tablet by mouth daily.     . traMADol (ULTRAM) 50 MG tablet Take 1 tablet (50 mg total) by mouth every 6 (six) hours as needed. 1 q 8 hrs prn 30 tablet 1  . XARELTO 20 MG TABS tablet TAKE 1 TABLET EACH DAY. 30 tablet 9   No current facility-administered medications for this encounter.    Allergies  Allergen Reactions  . Benadryl [Diphenhydramine Hcl] Other (See Comments)    Restless leg  . Clopidogrel Bisulfate Nausea Only    Nausea & pain  . Contrast Media [Iodinated Diagnostic Agents] Palpitations    Rapid heart rate, hot    Social History   Social History  . Marital Status: Married    Spouse Name: N/A  . Number of Children: 2  . Years of Education: master's   Occupational History  . Speech Pathologist    Social History Main Topics  . Smoking status: Former Smoker -- 1.50 packs/day for 30 years    Types: Cigarettes    Quit date: 08/13/1993  . Smokeless tobacco: Never  Used  . Alcohol Use: No  . Drug Use: No  . Sexual Activity: Not on file   Other Topics Concern  . Not on file   Social History Narrative    Family History  Problem Relation Age of Onset  . Heart attack Father 67    2nd MI at 83  . Colon cancer Brother 66  . Gout Brother   . Diabetes Neg Hx   . Stroke Neg Hx   . Ulcers Mother   . Emphysema Mother 50  . Colon polyps Sister   . Cancer Sister     Basal cell carcinoma  . Pneumonia Maternal Grandmother   . Hypertension Brother   . Hyperlipidemia Brother   . Cancer Brother     Skin    ROS- All systems are reviewed and negative except as per the HPI above  Physical Exam: Filed Vitals:   10/05/15 1347  BP: 158/64  Pulse: 68  Height: 5\' 2"  (1.575 m)  Weight: 194 lb 6.4 oz (88.179 kg)    GEN- The patient is well appearing, alert and oriented x 3 today.   Head- normocephalic, atraumatic Eyes-  Sclera clear, conjunctiva pink Ears- hearing intact Oropharynx- clear Neck- supple, no JVP Lymph- no cervical lymphadenopathy Lungs- Clear to ausculation bilaterally, normal work of breathing Heart- Regular rate and rhythm, no murmurs, rubs or gallops, PMI not laterally displaced GI- soft, NT, ND, + BS Extremities- no clubbing, cyanosis, or edema MS- no significant deformity or atrophy Skin- no rash or lesion Psych- euthymic mood, full affect Neuro- strength and sensation are intact  EKG- NSR, pr int 194 ms, qrs int 82 ms, qtc 446 ms Epic records reviewed  Echo- TEE- 12/17/13- Left ventricle: The cavity size was normal.  There was mild concentric hypertrophy. Systolic function was normal. The estimated ejection fraction was in the range of 55% to 60%. Wall motion was normal; there were no regional wall motion abnormalities. - Aortic valve: No evidence of vegetation. - Aorta: The aorta was normal, not dilated, and non-diseased. - Mitral valve: No evidence of vegetation. Trivial regurgitation. - Left atrium: No  evidence of thrombus in the atrial cavity or appendage. - Right atrium: No evidence of thrombus in the atrial cavity or appendage. - Atrial septum: There was a patent foramen ovale. - Tricuspid valve: No evidence of vegetation. Trivial regurgitation. - Pulmonic valve: No evidence of vegetation.  Surface echo-02/26/08- Left atrium mildly dilated   Assessment and Plan:  1. Symptomatic persistent afib Successful cardioversion but pt still feels fatigued, dyspnea with exertion, unclear etiology Has some complaints of sneezing, cough, congestion  Chest xray today WNL  2. Lifestyle risk factors Pt states 40 lb weight loss and congratulated on her success and to continue with efforts Has spinal stenosis and does not exercise Encouraged to water walk or try recumbent bike which the pt thinks she can do without pain.  3. Snoring/possible sleep apnea Pt is scheduled for sleep study and could be contributing to symptoms fi she has sleep apnea  4. HTN Recently elevated  Decrease salt Weight stable  5. CAD Stress test within the last year low risk for ischemia, but with known CAD, if symptoms of fatigue, dyspnea, htn do not improve, I want her to f/u with Dr. Gwenlyn Morrison. She agreed.    Geroge Baseman Faven Watterson, [facility] [address] [place], [place] [address] [phone]

## 2015-10-07 ENCOUNTER — Telehealth: Payer: Self-pay | Admitting: Cardiovascular Disease

## 2015-10-07 NOTE — Telephone Encounter (Signed)
Pt of Dr. Gwenlyn Found. Notes she has had BPs running higher since recent cardioversion on 2/17. Gave me semi-daily logs of BP & HR:  ---------------- 2/14 - 159/87 HR 86 2/15 - 126/83 HR 94 2/16 - 118/63 HR 77 2/17 - DCCV 2/20 - 154/71 HR 68 2/24 - 157/68 HR 66   Advised since the last week of recordings was less frequently observed, to do 1 week of daily BP checks & keep diary. Pt notes she sometimes takes an extra 1/2 tab of her isosorbide for the purpose of lowering BP. Pt aware I will route for considerations but otherwise she should keep the BP log and inform us in a week, will sched BP mgmt appt if needed. Pt voiced thanks and understanding.

## 2015-10-07 NOTE — Telephone Encounter (Signed)
Pt c/o BP issue: STAT if pt c/o blurred vision, one-sided weakness or slurred speech  1. What are your last 5 BP readings? 182/69 1pm today 10/07/15  2. Are you having any other symptoms (ex. Dizziness, headache, blurred vision, passed out)? Headache and SOB (both sitting and moving) last 2 or 3 days,   3. What is your BP issue? BP is high

## 2015-10-08 NOTE — Telephone Encounter (Signed)
Agree with assessment.  Will wait for further readings before making adjustments

## 2015-10-11 ENCOUNTER — Encounter: Payer: Self-pay | Admitting: Neurology

## 2015-10-11 ENCOUNTER — Ambulatory Visit (INDEPENDENT_AMBULATORY_CARE_PROVIDER_SITE_OTHER): Payer: Medicare Other | Admitting: Neurology

## 2015-10-11 VITALS — BP 122/60 | HR 66 | Resp 20 | Ht 62.0 in | Wt 196.0 lb

## 2015-10-11 DIAGNOSIS — E662 Morbid (severe) obesity with alveolar hypoventilation: Secondary | ICD-10-CM

## 2015-10-11 DIAGNOSIS — I25118 Atherosclerotic heart disease of native coronary artery with other forms of angina pectoris: Secondary | ICD-10-CM | POA: Diagnosis not present

## 2015-10-11 DIAGNOSIS — I10 Essential (primary) hypertension: Secondary | ICD-10-CM | POA: Diagnosis not present

## 2015-10-11 DIAGNOSIS — R0683 Snoring: Secondary | ICD-10-CM | POA: Diagnosis not present

## 2015-10-11 DIAGNOSIS — I480 Paroxysmal atrial fibrillation: Secondary | ICD-10-CM

## 2015-10-11 DIAGNOSIS — E669 Obesity, unspecified: Secondary | ICD-10-CM | POA: Insufficient documentation

## 2015-10-11 DIAGNOSIS — E1169 Type 2 diabetes mellitus with other specified complication: Secondary | ICD-10-CM | POA: Diagnosis not present

## 2015-10-11 NOTE — Patient Instructions (Signed)
Sleep Studies A sleep study (polysomnogram) is a series of tests done while you are sleeping. It can show how well you sleep. This can help your health care provider diagnose a sleep disorder and show how severe your sleep disorder is. A sleep study may lead to treatment that will help you sleep better and prevent other medical problems caused by poor sleep. If you have a sleep disorder, you may also be at risk for:   Sleep-related accidents.  High blood pressure.  Heart disease.  Stroke.  Other medical conditions. Sleep disorders are common. Your health care provider may suspect a sleep disorder if you:  Have loud snoring most nights.  Have brief periods when you stop breathing at night.  Feel sleepy on most days.  Fall asleep suddenly during the day.  Have trouble falling asleep or staying asleep.  Feel like you need to move your legs when trying to fall asleep.  Have dreams that seem very real shortly after falling asleep.  Feel like you cannot move when you first wake up. WHICH TESTS WILL I NEED TO HAVE?  Most sleep studies last all night and include these tests:  Recordings of your brain activity.  Recordings of your eye movements.  Recording of your heart rate and rhythm.  Blood pressure readings.  Readings of the amount of oxygen in your blood.  Measurements of your chest and belly movement as you breathe during sleep. If you have signs of the sleep disorder called sleep apnea during your test, you may get a mask to wear for the second half of the night.   The mask provides continuous positive airway pressure (CPAP). This may improve sleep apnea significantly.  You will then have all tests done again with the mask in place to see if your measurements and recordings change. HOW ARE SLEEP STUDIES DONE? Most sleep studies are done over one full night of sleep.   You will arrive at the study center in the evening and can go home in the morning.  Bring your  pajamas and toothbrush.  Do not have caffeine on the day of your sleep study.  Your health care provider will let you know if you need to stop taking any of your regular medicines before the test. To do the tests included in a polysomnogram, you will have:  Round, sticky patches with sensors attached to recording wires (electrodes) placed on your scalp, face, chest, and limbs.  Wires from all the electrodes and sensors run from your bed to a computer. The wires can be taken off and put back on if you need to get out of bed to go to the bathroom.  A sensor placed over your nose to measure airflow.  A finger clip put on one finger to measure your blood oxygen level.  A belt around your belly and a belt around your chest to measure breathing movements. WHERE ARE SLEEP STUDIES DONE?  Sleep studies are done at sleep centers. A sleep center may be inside a hospital, office, or clinic.  The room where you have the study may look like a hospital room or a hotel room. The health care providers doing the study may come in and out of the room during the study. Most of the time, they will be in another room monitoring your test.  HOW IS INFORMATION FROM SLEEP STUDIES HELPFUL? A polysomnogram can be used along with your medical history and a physical exam to diagnose conditions, such as:  Sleep   apnea.  Restless legs syndrome.  Sleep-related seizure disorders.  Sleep-related movement disorders. A medical doctor who specializes in sleep will evaluate your sleep study. The specialist will share the results with your primary health care provider. Treatments based on your sleep study may include:  Improving your sleep habits (sleep hygiene).  Wearing a CPAP mask.  Wearing an oral device at night to improve breathing and reduce snoring.  Taking medicine for:  Restless legs syndrome.  Sleep-related seizure disorder.  Sleep-related movement disorder.   This information is not intended to  replace advice given to you by your health care provider. Make sure you discuss any questions you have with your health care provider.   Document Released: 02/03/2003 Document Revised: 08/20/2014 Document Reviewed: 10/05/2013 Elsevier Interactive Patient Education 2016 Elsevier Inc[employer] Atrial Fibrillation Atrial fibrillation is a type of irregular or rapid heartbeat (arrhythmia). In atrial fibrillation, the heart quivers continuously in a chaotic pattern. This occurs when parts of the heart receive disorganized signals that make the heart unable to pump blood normally. This can increase the risk for stroke, heart failure, and other heart-related conditions. There are different types of atrial fibrillation, including:  Paroxysmal atrial fibrillation. This type starts suddenly, and it usually stops on its own shortly after it starts.  Persistent atrial fibrillation. This type often lasts longer than a week. It may stop on its own or with treatment.  Long-lasting persistent atrial fibrillation. This type lasts longer than 12 months.  Permanent atrial fibrillation. This type does not go away. Talk with your health care provider to learn about the type of atrial fibrillation that you have. CAUSES This condition is caused by some heart-related conditions or procedures, including:  A heart attack.  Coronary artery disease.  Heart failure.  Heart valve conditions.  High blood pressure.  Inflammation of the sac that surrounds the heart (pericarditis).  Heart surgery.  Certain heart rhythm disorders, such as Wolf-Parkinson-White syndrome. Other causes include:  Pneumonia.  Obstructive sleep apnea.  Blockage of an artery in the lungs (pulmonary embolism, or PE).  Lung cancer.  Chronic lung disease.  Thyroid problems, especially if the thyroid is overactive (hyperthyroidism).  Caffeine.  Excessive alcohol use or illegal drug use.  Use of some medicines, including certain  decongestants and diet pills. Sometimes, the cause cannot be found. RISK FACTORS This condition is more likely to develop in:  People who are older in age.  People who smoke.  People who have diabetes mellitus.  People who are overweight (obese).  Athletes who exercise vigorously. SYMPTOMS Symptoms of this condition include:  A feeling that your heart is beating rapidly or irregularly.  A feeling of discomfort or pain in your chest.  Shortness of breath.  Sudden light-headedness or weakness.  Getting tired easily during exercise. In some cases, there are no symptoms. DIAGNOSIS Your health care provider may be able to detect atrial fibrillation when taking your pulse. If detected, this condition may be diagnosed with:  An electrocardiogram (ECG).  A Holter monitor test that records your heartbeat patterns over a 24-hour period.  Transthoracic echocardiogram (TTE) to evaluate how blood flows through your heart.  Transesophageal echocardiogram (TEE) to view more detailed images of your heart.  A stress test.  Imaging tests, such as a CT scan or chest X-ray.  Blood tests. TREATMENT The main goals of treatment are to prevent blood clots from forming and to keep your heart beating at a normal rate and rhythm. The type of treatment that you  receive depends on many factors, such as your underlying medical conditions and how you feel when you are experiencing atrial fibrillation. This condition may be treated with:  Medicine to slow down the heart rate, bring the heart's rhythm back to normal, or prevent clots from forming.  Electrical cardioversion. This is a procedure that resets your heart's rhythm by delivering a controlled, low-energy shock to the heart through your skin.  Different types of ablation, such as catheter ablation, catheter ablation with pacemaker, or surgical ablation. These procedures destroy the heart tissues that send abnormal signals. When the  pacemaker is used, it is placed under your skin to help your heart beat in a regular rhythm. HOME CARE INSTRUCTIONS  Take over-the counter and prescription medicines only as told by your health care provider.  If your health care provider prescribed a blood-thinning medicine (anticoagulant), take it exactly as told. Taking too much blood-thinning medicine can cause bleeding. If you do not take enough blood-thinning medicine, you will not have the protection that you need against stroke and other problems.  Do not use tobacco products, including cigarettes, chewing tobacco, and e-cigarettes. If you need help quitting, ask your health care provider.  If you have obstructive sleep apnea, manage your condition as told by your health care provider.  Do not drink alcohol.  Do not drink beverages that contain caffeine, such as coffee, soda, and tea.  Maintain a healthy weight. Do not use diet pills unless your health care provider approves. Diet pills may make heart problems worse.  Follow diet instructions as told by your health care provider.  Exercise regularly as told by your health care provider.  Keep all follow-up visits as told by your health care provider. This is important. PREVENTION  Avoid drinking beverages that contain caffeine or alcohol.  Avoid certain medicines, especially medicines that are used for breathing problems.  Avoid certain herbs and herbal medicines, such as those that contain ephedra or ginseng.  Do not use illegal drugs, such as cocaine and amphetamines.  Do not smoke.  Manage your high blood pressure. SEEK MEDICAL CARE IF:  You notice a change in the rate, rhythm, or strength of your heartbeat.  You are taking an anticoagulant and you notice increased bruising.  You tire more easily when you exercise or exert yourself. SEEK IMMEDIATE MEDICAL CARE IF:  You have chest pain, abdominal pain, sweating, or weakness.  You feel nauseous.  You notice  blood in your vomit, bowel movement, or urine.  You have shortness of breath.  You suddenly have swollen feet and ankles.  You feel dizzy.  You have sudden weakness or numbness of the face, arm, or leg, especially on one side of the body.  You have trouble speaking, trouble understanding, or both (aphasia).  Your face or your eyelid droops on one side. These symptoms may represent a serious problem that is an emergency. Do not wait to see if the symptoms will go away. Get medical help right away. Call your local emergency services (911 in the [place] Do not drive yourself to the hospital.   This information is not intended to replace advice given to you by your health care provider. Make sure you discuss any questions you have with your health care provider.   Document Released: 07/30/2005 Document Revised: 04/20/2015 Document Reviewed: 11/24/2014 Elsevier Interactive Patient Education [employer].

## 2015-10-11 NOTE — Progress Notes (Signed)
SLEEP MEDICINE CLINIC   Provider:  Larey Seat, M D  Referring Provider: Hendricks Limes, MD Primary Care Physician:  Hoyt Koch, MD  Chief Complaint  Patient presents with  . New Patient (Initial Visit)    afib pt, wants sleep study and a neurological evaluation, rm 11, alone    HPI:  [name] is a 65 y.o. female , seen here as a referral from Dr. Linna Darner for a sleep consultation,   Chief complaint according to patient : " insomnia- difficulties to go to sleep , but Ican stay asleep" . 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   Mrs. [name] is a patient with atrial fibrillation and sleep initiation insomnia, she also had a Bell's palsy in 2001, a cholecystectomy in 2012 cardiac stenting in 2009 and a hysterectomy in 1982. Her past medical history includes hypertension, diabetes, hypercholesterolemia, heart disease atrial fibrillation, anxiety and migraine. In her review of systems she endorsed further easy bruising, shortness of breath, chest pain and palpitation being aware of a cardiac murmur, restless legs and headaches. It was upon further recommendation of her cardiologist at the atrial fibrillation clinic at [facility] where she was recommended to undergo a sleep study. The patient was educated about the correlation between atrial fibrillation and obstructive sleep apnea. She sees Dr. Quay Burow. She has a panic disorder and takes Ativan.   Paroxysmal atrial fibrillation was first diagnosed in May 2015 with a successful conversion 2 first of May of the same year and again in September of the same year. She then suffered in early 2017 recurrent episode and her cardiologist increased her metoprolol 250 minute times a day to control the rate. She underwent another cardioversion in feb 2017 ,  converted on the dose and has been maintaining sinus rhythm for one week.  Sleep habits are as follows:  She takes Lorazepam at night and melatonin. She sleeps on her right  side. She wakes up prone. Bedroom is cold, quiet and dark.  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 Working 2- 8 PM, late dinner, reading. And going to bed after midnight after some " me time" . She sleeps 7-8 hours and often rises at noon. She sleeps with her spouse, she reportedly kicks her wife a lot.  She often wakes with palpitations and noted a very high blood pressure. She has woken diaphoretic.  Her spouse wakes up "a zillion times" but can go to sleep. Delayed sleep cycle for years . On weekends she sleeps 10-12 hours , 4 AM  until 2 PM. She does not nap in daytime. She does drink coffee after waking up and after work (!).  She wakes with a dry mouth and no headaches.  Sleep medical history and family sleep history: her sister has hypersomnia. Social history:  Married to same sex partner,  2 daughters 66 and 40. No ETOH, No tobacco, little  caffeine.   Review of Systems: Out of a complete 14 system review, the patient complains of only the following symptoms, and all other reviewed systems are negative.   Epworth score 6 , Fatigue severity score 58  , depression score 2   Social History   Social History  . Marital Status: Married    Spouse Name: N/A  . Number of Children: 2  . Years of Education: master's   Occupational History  . Speech Pathologist    Social History Main Topics  .  Smoking status: Former Smoker -- 1.50 packs/day for 30 years    Types: Cigarettes    Quit date: 08/13/1993  . Smokeless tobacco: Never Used  . Alcohol Use: No  . Drug Use: No  . Sexual Activity: Not on file   Other Topics Concern  . Not on file   Social History Narrative    Family History  Problem Relation Age of Onset  . Heart attack Father 86    2nd MI at 60  . Colon cancer Brother 44  . Gout Brother   . Diabetes Neg Hx   . Stroke Neg Hx   . Ulcers Mother   . Emphysema Mother 93  . Colon polyps Sister   . Cancer Sister     Basal  cell carcinoma  . Pneumonia Maternal Grandmother   . Hypertension Brother   . Hyperlipidemia Brother   . Cancer Brother     Skin    Past Medical History  Diagnosis Date  . Arthritis   . Asthma   . GERD (gastroesophageal reflux disease)   . Heart murmur   . Hyperlipidemia   . Hypertension   . Pneumonia 2008  . Goiter   . Diabetes mellitus     Type 2  . Anemia, unspecified   . Chronic low back pain   . Polycystic ovary disease     Hysterectomy in 1982 for this  . Bell's palsy     Facial nerve decompression in 2001  . Shortness of breath dyspnea     ECHO 02/26/08 showed only minor abnormalities  . Coronary artery disease     Myoview 04/12/11 was entirely normal. ECHO 02/26/08 showed only minor abnormalities. Stenting 05/26/08 of her posterolateral branch to the left circumflex coronary artery. Used a 2.5x38mm Taxus Monorail stent.myoview 2014 was without ischemia  . COPD with asthma ([place])   . Glaucoma   . Spinal stenosis   . History of nuclear stress test 2012; 2014    lexiscan; normal pattern of perfusion; normal, low risk scan   . Atrial fibrillation with RVR ([place])     a. on Xarelto    Past Surgical History  Procedure Laterality Date  . Abdominal hysterectomy  1982    & BSO; for polycystic ovary disease  . Facial nerve decompression  2001/2002    bells palsy   . Laparoscopic cholecystectomy  06/15/2011     Dr Dalbert Batman  . Colonoscopy      last 2009; Dr Cristina Gong; due 2019  . Upper gi endoscopy  2009    negative  . Coronary angioplasty  05/26/2008    Stenting of her posterolateral branch to the left circumflex coronary artery. Used a 2.5x52mm Taxus Monorail stent.  . Transthoracic echocardiogram  [id number]    LV systolic function normal with mild conc LVH; LA mildly dilated; trace MR/TR  . Tee without cardioversion N/A 12/17/2013    Procedure: TRANSESOPHAGEAL ECHOCARDIOGRAM (TEE);  Surgeon: Pixie Casino, MD;  Location: [facility] ENDOSCOPY;  Service: Cardiovascular;   Laterality: N/A;  trish/ja  . Cardioversion N/A 12/17/2013    Procedure: CARDIOVERSION;  Surgeon: Pixie Casino, MD;  Location: [facility];  Service: Cardiovascular;  Laterality: N/A;  . Cardioversion N/A 09/30/2015    Procedure: CARDIOVERSION;  Surgeon: Dorothy Spark, MD;  Location: [facility] ENDOSCOPY;  Service: Cardiovascular;  Laterality: N/A;    Current Outpatient Prescriptions  Medication Sig Dispense Refill  . ascorbic acid (VITAMIN C) 1000 MG tablet Take 1,000 mg by mouth 2 (two) times daily.     [name]  budesonide-formoterol (SYMBICORT) 160-4.5 MCG/ACT inhaler Inhale 2 puffs into the lungs 2 (two) times daily. (Patient taking differently: Inhale 2 puffs into the lungs 2 (two) times daily as needed. ) 1 Inhaler 3  . CALCIUM-MAG-VIT C-VIT D PO Take 2 tablets by mouth daily.     . celecoxib (CELEBREX) 200 MG capsule Take 200 mg by mouth 2 (two) times daily.     [name] diltiazem (CARDIZEM CD) 180 MG 24 hr capsule Take 1 capsule (180 mg total) by mouth daily. 30 capsule 6  . estradiol (ESTRACE) 0.5 MG tablet Take 0.5 mg by mouth daily.     [name] gabapentin (NEURONTIN) 100 MG capsule TAKE 1 CAPSULE 3 TIMES DAILY X 72 HOURS.MAY INCREASE BY 1 CAPSULE PERDOSE EVERY 72 HRS. 180 capsule 0  . guaiFENesin (MUCINEX) 600 MG 12 hr tablet Take 600 mg by mouth 2 (two) times daily.    . INVOKANA 100 MG TABS tablet TAKE 1 TABLET ONCE DAILY. 90 tablet 3  . isosorbide mononitrate (IMDUR) 30 MG 24 hr tablet Take 1 tablet (30 mg total) by mouth daily. NEED OV. (Patient taking differently: Take 15 mg by mouth daily. NEED OV.) 30 tablet 0  . JANUMET 50-1000 MG tablet TAKE 1/2 TABLET IN THE MORNING WITH BREAKFAST AND 1 TABLET IN THE EVENING WITH SUPPER. 135 tablet 1  . latanoprost (XALATAN) 0.005 % ophthalmic solution Place 1 drop into both eyes at bedtime.    . levalbuterol (XOPENEX HFA) 45 MCG/ACT inhaler Inhale 1-2 puffs into the lungs every 6 (six) hours as needed for wheezing. 1 Inhaler 1  . levalbuterol (XOPENEX) 0.63  MG/3ML nebulizer solution Take 3 mLs (0.63 mg total) by nebulization every 6 (six) hours as needed for wheezing or shortness of breath. 75 mL 1  . LORazepam (ATIVAN) 0.5 MG tablet Take 1 mg by mouth at bedtime.     . Melatonin 5 MG TABS Take 1 tablet by mouth at bedtime.     . metoprolol succinate (TOPROL-XL) 50 MG 24 hr tablet Take 3 tablets (150 mg total) by mouth daily. 90 tablet 6  . metoprolol tartrate (LOPRESSOR) 25 MG tablet Take 1 Tablet AS NEEDED for Irregular Heart Rate 15 tablet 1  . Multiple Vitamins-Iron (MULTIVITAMINS WITH IRON) TABS Take 1 tablet by mouth 2 (two) times daily.     [name] omega-3 acid ethyl esters (LOVAZA) 1 G capsule Take 1 g by mouth daily.     [name] omeprazole (PRILOSEC) 20 MG capsule Take 20 mg by mouth daily.     Glory Rosebush DELICA LANCETS FINE MISC 1 Units by Does not apply route as directed. 100 each 3  . PARoxetine (PAXIL) 10 MG tablet Take 25 mg by mouth daily.     . quinapril (ACCUPRIL) 40 MG tablet Take 1 tablet (40 mg total) by mouth daily. 30 tablet 5  . rosuvastatin (CRESTOR) 10 MG tablet Take 1 tablet (10 mg total) by mouth at bedtime. 30 tablet 6  . senna (SENOKOT) 8.6 MG TABS Take 1 tablet by mouth daily.     . traMADol (ULTRAM) 50 MG tablet Take 1 tablet (50 mg total) by mouth every 6 (six) hours as needed. 1 q 8 hrs prn 30 tablet 1  . XARELTO 20 MG TABS tablet TAKE 1 TABLET EACH DAY. 30 tablet 9   No current facility-administered medications for this visit.    Allergies as of 10/11/2015 - Review Complete 10/11/2015  Allergen Reaction Noted  . Benadryl [diphenhydramine hcl] Other (See Comments) 06/15/2011  .  Clopidogrel bisulfate Nausea Only   . Contrast media [iodinated diagnostic agents] Palpitations 06/15/2011    Vitals: BP 122/60 mmHg  Pulse 66  Resp 20  Ht 5\' 2"  (1.575 m)  Wt 196 lb (88.905 kg)  BMI 35.84 kg/m2 Last Weight:  Wt Readings from Last 1 Encounters:  10/11/15 196 lb (88.905 kg)   [id number] mass index is 35.84 kg/(m^2).     Last  Height:   Ht Readings from Last 1 Encounters:  10/11/15 5\' 2"  (1.575 m)    Physical exam:  General: The patient is awake, alert and appears not in acute distress. The patient is well groomed. Head: Normocephalic, atraumatic. Neck is supple. Mallampati 3  neck circumference:16.25 . Nasal airflow intact , TMJ is evident . Retrognathia is seen.  Cardiovascular:  Regular rate and rhythm , without  murmurs or carotid bruit, and without distended neck veins. Respiratory: Lungs are clear to auscultation. Skin:  Without evidence of edema, or rash Trunk: BMI is elevated . The patient's posture is erect.   Neurologic exam : The patient is awake and alert, oriented to place and time.   Memory subjective  described as intact.  Attention span & concentration ability appears normal.  Speech is fluent, without dysarthria, dysphonia or aphasia.  Mood and affect are appropriate.  Cranial nerves: Pupils are equal and briskly reactive to light. Funduscopic exam without evidence of pallor or edema. Extraocular movements in vertical and horizontal planes intact and without nystagmus. Visual fields by finger perimetry are intact.Hearing to finger rub intact. Facial sensation intact to fine touch. Facial motor strength is symmetric and tongue and uvula move midline. Shoulder shrug was symmetrical.  Motor exam:  Normal tone, muscle bulk and symmetric strength in all extremities. Sensory:  Fine touch, pinprick and vibration /Proprioception tested in the upper extremities was normal. Coordination: Rapid alternating movements in the fingers/hands was normal.  Finger-to-nose maneuver  normal without evidence of ataxia, dysmetria or tremor. Gait and station: Patient walks without assistive device and is able unassisted to climb up to the exam table. Strength within normal limits.Stance is stable and normal.Tandem gait is unfragmented. Turns with 3 Steps. Deep tendon reflexes: in the  upper and lower extremities are  symmetric and intact. Babinski maneuver response is downgoing.  The patient was advised of the nature of the diagnosed sleep disorder , the treatment options and risks for general a health and wellness arising from not treating the condition.  I spent more than 40 minutes of face to face time with the patient. Greater than 50% of time was spent in counseling and coordination of care. We have discussed the diagnosis and differential and I answered the patient's questions.     Assessment:  After physical and neurologic examination, review of laboratory studies,  Personal review of imaging studies, reports of other /same  Imaging studies ,  Results of polysomnography/ neurophysiology testing and pre-existing records as far as provided in visit., my assessment is   1)The patient has been told by her daughters that she snores and that she has crescendo snoring, based on her neck circumference and her BMI she is at a higher risk of having obstructive sleep apnea. Atrial fibrillation also seen more frequently and patients with obstructive sleep apnea also given of costs sleep apnea -obstructive sleep apnea can cause atrial fibrillation. 2) Obesity. She lost 40 pounds recently but her BMI is still elevated for this reason we will order a split night polysomnography with capnography and if the patient  does not have apnea but remains hypoxemic for over 60 minutes total time I want her  to be titrated to oxygen. 3) delayed sleep rhythm, circadian shift  = patient sleeps , later and later and longer into the day.   Plan:  Treatment plan and additional workup :  Daytime sleep study , atrial fib and obesity , snoring and possible apnea.  Needs a SPLIT- with capnography. Titrate to oxygen.      Asencion Partridge Katisha Shimizu MD  10/11/2015   CC: Hendricks Limes, Md [address], [place] [id number]

## 2015-10-17 ENCOUNTER — Telehealth: Payer: Self-pay | Admitting: *Deleted

## 2015-10-17 DIAGNOSIS — Z79899 Other long term (current) drug therapy: Secondary | ICD-10-CM

## 2015-10-17 NOTE — Telephone Encounter (Signed)
Pt had called me and left msg. I gave her a return call.  Pt gave me ~2 weeks of BP readings since we last spoke. This is in regards to a recommendation of BP diary before further adjustments to meds. She also reports since cardioversion she has remained in sinus rhythm.  Pt gave me 1-2 readings per day. She has since 2/24 averaged 150-170/55-80.  HR betw 55-70 Her lowest readings out of approx 20 recordings was 131/56. Only 2 other instances of BP being in [id number] systolic range.    Pt aware I will forward to [place] who can review. Pt states no med allergies o/t those currently listed. Endorses med list as accurate.   Work number preferred as pt's primary means of contact.

## 2015-10-18 NOTE — Telephone Encounter (Signed)
LM for patient to call 

## 2015-10-18 NOTE — Telephone Encounter (Signed)
Reviewed home BP readings, still Q000111Q are > Q000111Q systolic.  All diastolic WNL.  Add chlorthalidone 12.5 mg qam and repeat BMET in 2 weeks.  Due for f/u with Dr. Gwenlyn Found

## 2015-10-19 MED ORDER — CHLORTHALIDONE 25 MG PO TABS: 12.5000 mg | ORAL_TABLET | Freq: Every day | ORAL | Status: DC

## 2015-10-19 NOTE — Telephone Encounter (Signed)
Pt returned call. Instructions given, appt for f/u made, advised on med use and potential SE's, call if new problems, keep BP diary and call if no improvement, o/w f/u at appt. Advised return for labwork in 2 weeks. Pt voiced understanding. All questions addressed.

## 2015-10-21 ENCOUNTER — Other Ambulatory Visit: Payer: Self-pay | Admitting: Cardiovascular Disease

## 2015-11-03 ENCOUNTER — Other Ambulatory Visit: Payer: Self-pay | Admitting: Cardiovascular Disease

## 2015-11-03 NOTE — Telephone Encounter (Signed)
REFILL 

## 2015-11-17 ENCOUNTER — Telehealth: Payer: Self-pay | Admitting: Cardiovascular Disease

## 2015-11-17 NOTE — Telephone Encounter (Signed)
Received records from [facility] for appointment on 11/29/15 with Dr Gwenlyn Found.  Records given to [facility] (medical records) for Dr Kennon Holter schedule on 11/29/15.

## 2015-11-29 ENCOUNTER — Ambulatory Visit (INDEPENDENT_AMBULATORY_CARE_PROVIDER_SITE_OTHER): Payer: Medicare Other | Admitting: Cardiovascular Disease

## 2015-11-29 ENCOUNTER — Encounter: Payer: Self-pay | Admitting: Cardiovascular Disease

## 2015-11-29 DIAGNOSIS — I2510 Atherosclerotic heart disease of native coronary artery without angina pectoris: Secondary | ICD-10-CM | POA: Diagnosis not present

## 2015-11-29 DIAGNOSIS — I739 Peripheral vascular disease, unspecified: Secondary | ICD-10-CM | POA: Diagnosis not present

## 2015-11-29 DIAGNOSIS — R0989 Other specified symptoms and signs involving the circulatory and respiratory systems: Secondary | ICD-10-CM

## 2015-11-29 DIAGNOSIS — I2583 Coronary atherosclerosis due to lipid rich plaque: Secondary | ICD-10-CM

## 2015-11-29 NOTE — Assessment & Plan Note (Signed)
History of hyperlipidemia on statin therapy with recent lipid profile performed 12/24/14 revealed a total cholesterol of 43, LDL 66 and HDL of 52

## 2015-11-29 NOTE — Progress Notes (Signed)
11/29/2015 [name]   [birth year]  [name]:[id number]  Primary Physician Hoyt Koch, MD Primary Cardiologist: Lorretta Harp MD [name]   HPI:  The patient is a very pleasant 65 year old moderately overweight divorced Caucasian female, mother of 2 adopted children, ages 6 and 71, who is formerly a patient of Dr. Ky Barban. 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 Her risk factors include former tobacco abuse, having quit 18 years ago, diabetes, hypertension, hyperlipidemia, and a positive family history for heart disease. She has adult-onset asthma as well. Dr. Melvern Banker stented her distal circumflex posterolateral branch, May 26, 2008, with some residual disease in the PDA branch, and normal LV function. Myoview performed May 06, 2008, was not ischemic. I saw her summer of last year, at which time she was complaining of chest pain, for which I prescribed Imdur, which she never filled. I suggested a stress test, which was never obtained. Because of her recurrent chest tightness and increasing dyspnea with radiation to her left neck, I performed cardiac catheterization on her, April 20, 2011, revealing a patent stent, 50% distal PDA lesion, with normal LV function. I thought her disease was stable and had not progressed, and recommended medical therapy. She has had no recurrent chest pain since I last saw her. She has some mild dyspnea. Recent lab work /2/13 revealed a total cholesterol of 129, LDL of 63, and HDL of 45.  She saw [name] in the office 05/13/13 with chest pain. A subsequent Myoview stress test was entirely normal. She was admitted to the hospital earlier this month with a good RPR which by history had been on for several weeks but prior. She was symptomatic. She'll underwent transesophageal guided DC cardioversion by Dr. Debara Morrison successfully with one shock and was placed on Xarelto oral  anticoagulation. She felt markedly improved clinically. She had a recurrent episode of atrial fibrillation 04/25/14 and underwent DC cardioversion by Dr. [name] in the ER. Her atenolol was changed to Toprol-XL 100 mg. She has seen Roderic Palau in the A. Fib clinic since my last visit with her. She still has paroxysmal A. Fib and is actually in A. Fib today. She is was recently seen in the emergency room for chest pain shortness of breath. At that time she was in sinus rhythm. She ruled out for myocardial infarction. A Myoview stress test September 2014 was nonischemic. She does have PAF on Xarelto  oral anticoagulation. She recently underwent outpatient cardioversion by Dr. Dr. Johnsie Morrison 09/30/15.  Current Outpatient Prescriptions  Medication Sig Dispense Refill  . ascorbic acid (VITAMIN C) 1000 MG tablet Take 1,000 mg by mouth 2 (two) times daily.     . budesonide-formoterol (SYMBICORT) 160-4.5 MCG/ACT inhaler Inhale 2 puffs into the lungs 2 (two) times daily. (Patient taking differently: Inhale 2 puffs into the lungs 2 (two) times daily as needed. ) 1 Inhaler 3  . CALCIUM-MAG-VIT C-VIT D PO Take 2 tablets by mouth daily.     . celecoxib (CELEBREX) 200 MG capsule Take 200 mg by mouth 2 (two) times daily.     [name] diltiazem (CARDIZEM CD) 180 MG 24 hr capsule TAKE 1 CAPSULE DAILY. 30 capsule 1  . estradiol (ESTRACE) 0.5 MG tablet Take 0.5 mg by mouth daily.     [name] gabapentin (NEURONTIN) 100 MG capsule TAKE 1 CAPSULE 3 TIMES DAILY X 72 HOURS.MAY INCREASE BY 1 CAPSULE PERDOSE EVERY 72 HRS. 180 capsule  0  . guaiFENesin (MUCINEX) 600 MG 12 hr tablet Take 600 mg by mouth 2 (two) times daily.    . INVOKANA 100 MG TABS tablet TAKE 1 TABLET ONCE DAILY. 90 tablet 3  . isosorbide mononitrate (IMDUR) 30 MG 24 hr tablet TAKE 1 TABLET ONCE DAILY. 30 tablet 2  . JANUMET 50-1000 MG tablet TAKE 1/2 TABLET IN THE MORNING WITH BREAKFAST AND 1 TABLET IN THE EVENING WITH SUPPER. 135 tablet 1  . levalbuterol (XOPENEX HFA) 45  MCG/ACT inhaler Inhale 1-2 puffs into the lungs every 6 (six) hours as needed for wheezing. 1 Inhaler 1  . levalbuterol (XOPENEX) 0.63 MG/3ML nebulizer solution Take 3 mLs (0.63 mg total) by nebulization every 6 (six) hours as needed for wheezing or shortness of breath. 75 mL 1  . LORazepam (ATIVAN) 0.5 MG tablet Take 1 mg by mouth at bedtime.     . Melatonin 5 MG TABS Take 1 tablet by mouth at bedtime.     . metoprolol succinate (TOPROL-XL) 50 MG 24 hr tablet Take 3 tablets (150 mg total) by mouth daily. 90 tablet 6  . metoprolol tartrate (LOPRESSOR) 25 MG tablet Take 1 Tablet AS NEEDED for Irregular Heart Rate 15 tablet 1  . Multiple Vitamins-Iron (MULTIVITAMINS WITH IRON) TABS Take 1 tablet by mouth 2 (two) times daily.     [name] omega-3 acid ethyl esters (LOVAZA) 1 G capsule Take 1 g by mouth daily.     [name] omeprazole (PRILOSEC) 20 MG capsule Take 20 mg by mouth daily.     [name] DELICA LANCETS FINE MISC 1 Units by Does not apply route as directed. 100 each 3  . PARoxetine (PAXIL) 10 MG tablet Take 25 mg by mouth daily.     . quinapril (ACCUPRIL) 40 MG tablet Take 1 tablet (40 mg total) by mouth daily. 30 tablet 5  . rosuvastatin (CRESTOR) 10 MG tablet Take 1 tablet (10 mg total) by mouth at bedtime. 30 tablet 6  . senna (SENOKOT) 8.6 MG TABS Take 1 tablet by mouth daily.     Alveda Reasons 20 MG TABS tablet TAKE 1 TABLET EACH DAY. 30 tablet 9  . latanoprost (XALATAN) 0.005 % ophthalmic solution Place 1 drop into both eyes at bedtime.     No current facility-administered medications for this visit.    Allergies  Allergen Reactions  . Benadryl [Diphenhydramine Hcl] Other (See Comments)    Restless leg  . Clopidogrel Bisulfate Nausea Only    Nausea & pain  . Contrast Media [Iodinated Diagnostic Agents] Palpitations    Rapid heart rate, hot    Social History   Social History  . Marital Status: Married    Spouse Name: N/A  . Number of Children: 2  . Years of Education: master's    Occupational History  . Speech Pathologist    Social History Main Topics  . Smoking status: Former Smoker -- 1.50 packs/day for 30 years    Types: Cigarettes    Quit date: 08/13/1993  . Smokeless tobacco: Never Used  . Alcohol Use: No  . Drug Use: No  . Sexual Activity: Not on file   Other Topics Concern  . Not on file   Social History Narrative     Review of Systems: General: negative for chills, fever, night sweats or weight changes.  Cardiovascular: negative for chest pain, dyspnea on exertion, edema, orthopnea, palpitations, paroxysmal nocturnal dyspnea or shortness of breath Dermatological: negative for rash Respiratory: negative for cough or wheezing  Urologic: negative for hematuria Abdominal: negative for nausea, vomiting, diarrhea, bright red blood per rectum, melena, or hematemesis Neurologic: negative for visual changes, syncope, or dizziness All other systems reviewed and are otherwise negative except as noted above.    Blood pressure 150/58, pulse 60, height 5\' 2"  (1.575 m), weight 197 lb 6 oz (89.529 kg), SpO2 98 %.  General appearance: alert and no distress Neck: no adenopathy, no carotid bruit, no JVD, supple, symmetrical, trachea midline and thyroid not enlarged, symmetric, no tenderness/mass/nodules Lungs: clear to auscultation bilaterally Heart: regular rate and rhythm, S1, S2 normal, no murmur, click, rub or gallop Extremities: extremities normal, atraumatic, no cyanosis or edema  EKG not performed  ASSESSMENT AND PLAN:   CAD (coronary artery disease), hx of LCX stent DES 2009,  last cath 2012 50-60% RCA stenosis, patent stent, 30 % LM. History of CAD status post circumflex DES stent in 2009. She did require recatheterization 2012 revealing a patent stent with a 50% lesion in a PDA. Her last Myoview performed 05/01/13 was nonischemic. She denies chest pain.  Hyperlipidemia History of hyperlipidemia on statin therapy with recent lipid profile  performed 12/24/14 revealed a total cholesterol of 43, LDL 66 and HDL of 52  Essential hypertension History of hypertension blood pressure measured at 150/58. She is on diltiazem, metoprolol and Accupril. Continue Current meds occurred this  Atrial fibrillation with rapid ventricular response History of symptomatic paroxysmal atrial fibrillation on Xarelto oral anticoagulation status post recent outpatient cardioversion by Dr. Johnsie Morrison 09/30/15.      Lorretta Harp MD FACP,FACC,FAHA, [facility] 11/29/2015 1:11 PM

## 2015-11-29 NOTE — Assessment & Plan Note (Signed)
History of CAD status post circumflex DES stent in 2009. She did require recatheterization 2012 revealing a patent stent with a 50% lesion in a PDA. Her last Myoview performed 05/01/13 was nonischemic. She denies chest pain.

## 2015-11-29 NOTE — Patient Instructions (Signed)
Medication Instructions:  Your physician recommends that you continue on your current medications as directed. Please refer to the Current Medication list given to you today.   Labwork: none  Testing/Procedures: Your physician has requested that you have a lower extremity arterial doppler- During this test, ultrasound is used to evaluate arterial blood flow in the legs. Allow approximately one hour for this exam.   Your physician has requested that you have a carotid duplex. This test is an ultrasound of the carotid arteries in your neck. It looks at blood flow through these arteries that supply the brain with blood. Allow one hour for this exam. There are no restrictions or special instructions.    Follow-Up: We request that you follow-up in: 6 months with an extender and in 12 months with Dr Andria Rhein will receive a reminder letter in the mail two months in advance. If you don't receive a letter, please call our office to schedule the follow-up appointment.     Any Other Special Instructions Will Be Listed Below (If Applicable).     If you need a refill on your cardiac medications before your next appointment, please call your pharmacy.

## 2015-11-29 NOTE — Assessment & Plan Note (Signed)
History of hypertension blood pressure measured at 150/58. She is on diltiazem, metoprolol and Accupril. Continue Current meds occurred this

## 2015-11-29 NOTE — Assessment & Plan Note (Signed)
History of symptomatic paroxysmal atrial fibrillation on Xarelto oral anticoagulation status post recent outpatient cardioversion by Dr. Johnsie Cancel 09/30/15.

## 2015-12-04 ENCOUNTER — Other Ambulatory Visit: Payer: Self-pay | Admitting: Internal Medicine

## 2015-12-16 ENCOUNTER — Other Ambulatory Visit: Payer: Self-pay | Admitting: Internal Medicine

## 2015-12-26 ENCOUNTER — Other Ambulatory Visit: Payer: Self-pay | Admitting: Cardiovascular Disease

## 2015-12-26 NOTE — Telephone Encounter (Signed)
Rx request sent to pharmacy.  

## 2016-01-02 ENCOUNTER — Other Ambulatory Visit (INDEPENDENT_AMBULATORY_CARE_PROVIDER_SITE_OTHER): Payer: Medicare Other

## 2016-01-02 ENCOUNTER — Encounter: Payer: Self-pay | Admitting: Internal Medicine

## 2016-01-02 ENCOUNTER — Ambulatory Visit (INDEPENDENT_AMBULATORY_CARE_PROVIDER_SITE_OTHER): Payer: Medicare Other | Admitting: Internal Medicine

## 2016-01-02 VITALS — BP 124/66 | HR 84 | Temp 98.1°F | Resp 18 | Ht 62.0 in | Wt 194.0 lb

## 2016-01-02 DIAGNOSIS — E662 Morbid (severe) obesity with alveolar hypoventilation: Secondary | ICD-10-CM

## 2016-01-02 DIAGNOSIS — E1159 Type 2 diabetes mellitus with other circulatory complications: Secondary | ICD-10-CM | POA: Diagnosis not present

## 2016-01-02 DIAGNOSIS — F603 Borderline personality disorder: Secondary | ICD-10-CM

## 2016-01-02 DIAGNOSIS — R4589 Other symptoms and signs involving emotional state: Secondary | ICD-10-CM

## 2016-01-02 LAB — HEMOGLOBIN A1C: Hgb A1c MFr Bld: 6.7 % — ABNORMAL HIGH (ref 4.6–6.5)

## 2016-01-02 MED ORDER — QUINAPRIL HCL 40 MG PO TABS: 40.0000 mg | ORAL_TABLET | Freq: Every day | ORAL | Status: DC

## 2016-01-02 MED ORDER — GABAPENTIN 100 MG PO CAPS: 100.0000 mg | ORAL_CAPSULE | Freq: Three times a day (TID) | ORAL | Status: DC

## 2016-01-02 MED ORDER — SITAGLIPTIN PHOS-METFORMIN HCL 50-1000 MG PO TABS: ORAL_TABLET | ORAL | Status: DC

## 2016-01-02 NOTE — Patient Instructions (Signed)
We have sent in the refills today and will send the lab results on mychart.   Come back in about 4-5 months for a physical and please feel free to call us sooner if you need [place].

## 2016-01-02 NOTE — Progress Notes (Signed)
Pre visit review using our clinic review tool, if applicable. No additional management support is needed unless otherwise documented below in the visit note. 

## 2016-01-04 DIAGNOSIS — F329 Major depressive disorder, single episode, unspecified: Secondary | ICD-10-CM | POA: Insufficient documentation

## 2016-01-04 DIAGNOSIS — F32A Depression, unspecified: Secondary | ICD-10-CM | POA: Insufficient documentation

## 2016-01-04 NOTE — Progress Notes (Signed)
   Subjective:    Patient ID: [name], female    DOB: 17-May-1951, 65 y.o.   MRN: [id number]  HPI The patient is a 65 YO female coming in for follow up of her diabetes (controlled on invokana, and janumet, also taking ACE-I and statin, complicated by vascular disease (CAD), no hypoglycemia), her breathing (taking symbicort, albuterol only when needed, usually only needed with severe allergies and cold, takes mucinex when needed as well, no flare in the last year), and her mood (taking paxil which has been doing well, no changes needed, would not like to try being off now). No new concerns.   Review of Systems  Constitutional: Negative for fever, activity change, appetite change, fatigue and unexpected weight change.  HENT: Negative.   Eyes: Negative.   Respiratory: Negative for cough, chest tightness, shortness of breath and wheezing.   Cardiovascular: Negative for chest pain, palpitations and leg swelling.  Gastrointestinal: Negative for abdominal pain, diarrhea, constipation and abdominal distention.  Musculoskeletal: Positive for arthralgias.  Skin: Negative.   Neurological: Negative.   Psychiatric/Behavioral: Negative.       Objective:   Physical Exam  Constitutional: She is oriented to person, place, and time. She appears well-developed and well-nourished.  Overweight  HENT:  Head: Normocephalic and atraumatic.  Eyes: EOM are normal.  Neck: Normal range of motion.  Cardiovascular: Normal rate and regular rhythm.   Pulmonary/Chest: Effort normal and breath sounds normal. No respiratory distress. She has no wheezes. She has no rales.  Abdominal: Soft. Bowel sounds are normal. She exhibits no distension. There is no tenderness. There is no rebound.  Musculoskeletal: She exhibits no edema.  Neurological: She is alert and oriented to person, place, and time. Coordination normal.  Skin: Skin is warm and dry.  Psychiatric: She has a normal mood and affect.   Filed Vitals:   01/02/16 1513  BP: 124/66  Pulse: 84  Temp: 98.1 F (36.7 C)  TempSrc: Oral  Resp: 18  Height: 5\' 2"  (1.575 m)  Weight: 194 lb (87.998 kg)  SpO2: 96%      Assessment & Plan:

## 2016-01-04 NOTE — Assessment & Plan Note (Signed)
Doing well on paxil and does not wish to change at this time.

## 2016-01-04 NOTE — Assessment & Plan Note (Signed)
Uses symbicort and levalbuterol when needed which is generally with concurrent cold and/or allergies.

## 2016-01-04 NOTE — Assessment & Plan Note (Signed)
Controlled on invokana and janumet and on ACE-I and statin. Complicated by vascular disease (CAD) which is stable. Checking HgA1c today. Adjust as needed. Reminded about yearly eye exam and foot care.

## 2016-01-06 ENCOUNTER — Other Ambulatory Visit: Payer: Self-pay | Admitting: Cardiovascular Disease

## 2016-01-06 NOTE — Telephone Encounter (Signed)
REFILL 

## 2016-02-02 ENCOUNTER — Other Ambulatory Visit: Payer: Self-pay | Admitting: Cardiovascular Disease

## 2016-02-02 NOTE — Telephone Encounter (Signed)
Rx(s) sent to pharmacy electronically.  

## 2016-03-09 ENCOUNTER — Other Ambulatory Visit: Payer: Self-pay | Admitting: Cardiovascular Disease

## 2016-03-19 ENCOUNTER — Telehealth: Payer: Self-pay | Admitting: Internal Medicine

## 2016-03-19 NOTE — Telephone Encounter (Signed)
On Call Cardiology   Patient called. Reports not feeling so good all day today. Chest feels tight and feels may be due to hot and humid weather outside and cold inside the house [facility]). BP 136/96, HR 88. No fever, N/V, palpitations, diaphoresis. Reports some cough and sharp chest pain with coughing spell. Also rports bad asthma.   Recs:  - She will take her inhaler - Continue to monitor HR and BP.  - Call back if no improvement or worsening of symptoms - Call 911 if severe chest tightness, shortness of breath, chest pressure  She verbalized understanding of this plan. Questions answered to her satisfaction.   Wandra Mannan, MD

## 2016-03-22 ENCOUNTER — Other Ambulatory Visit: Payer: Self-pay | Admitting: Emergency Medicine

## 2016-03-22 ENCOUNTER — Other Ambulatory Visit: Payer: Self-pay | Admitting: Physician Assistant

## 2016-03-22 DIAGNOSIS — J209 Acute bronchitis, unspecified: Secondary | ICD-10-CM

## 2016-03-22 DIAGNOSIS — J45901 Unspecified asthma with (acute) exacerbation: Secondary | ICD-10-CM

## 2016-03-23 ENCOUNTER — Other Ambulatory Visit: Payer: Self-pay | Admitting: Internal Medicine

## 2016-03-23 DIAGNOSIS — J209 Acute bronchitis, unspecified: Secondary | ICD-10-CM

## 2016-03-23 DIAGNOSIS — J45901 Unspecified asthma with (acute) exacerbation: Secondary | ICD-10-CM

## 2016-03-30 ENCOUNTER — Other Ambulatory Visit: Payer: Self-pay | Admitting: Cardiovascular Disease

## 2016-03-30 NOTE — Telephone Encounter (Signed)
Rx request sent to pharmacy.  

## 2016-04-01 ENCOUNTER — Other Ambulatory Visit: Payer: Self-pay | Admitting: Internal Medicine

## 2016-04-19 ENCOUNTER — Other Ambulatory Visit: Payer: Self-pay | Admitting: Cardiovascular Disease

## 2016-05-18 ENCOUNTER — Other Ambulatory Visit: Payer: Self-pay | Admitting: Cardiovascular Disease

## 2016-06-20 ENCOUNTER — Ambulatory Visit (HOSPITAL_COMMUNITY)
Admission: RE | Admit: 2016-06-20 | Discharge: 2016-06-20 | Disposition: A | Discharge status: Home / Self Care | Payer: Medicare Other | Source: Ambulatory Visit | Attending: Nurse Practitioner | Admitting: Nurse Practitioner

## 2016-06-20 ENCOUNTER — Encounter (HOSPITAL_COMMUNITY): Payer: Self-pay | Admitting: Nurse Practitioner

## 2016-06-20 VITALS — BP 118/64 | HR 92 | Ht 62.0 in | Wt 198.8 lb

## 2016-06-20 DIAGNOSIS — M545 Low back pain: Secondary | ICD-10-CM | POA: Insufficient documentation

## 2016-06-20 DIAGNOSIS — Z823 Family history of stroke: Secondary | ICD-10-CM | POA: Diagnosis not present

## 2016-06-20 DIAGNOSIS — Z7984 Long term (current) use of oral hypoglycemic drugs: Secondary | ICD-10-CM | POA: Diagnosis not present

## 2016-06-20 DIAGNOSIS — H409 Unspecified glaucoma: Secondary | ICD-10-CM | POA: Diagnosis not present

## 2016-06-20 DIAGNOSIS — G8929 Other chronic pain: Secondary | ICD-10-CM | POA: Insufficient documentation

## 2016-06-20 DIAGNOSIS — Z87891 Personal history of nicotine dependence: Secondary | ICD-10-CM | POA: Diagnosis not present

## 2016-06-20 DIAGNOSIS — E119 Type 2 diabetes mellitus without complications: Secondary | ICD-10-CM | POA: Insufficient documentation

## 2016-06-20 DIAGNOSIS — I480 Paroxysmal atrial fibrillation: Secondary | ICD-10-CM

## 2016-06-20 DIAGNOSIS — Z955 Presence of coronary angioplasty implant and graft: Secondary | ICD-10-CM | POA: Diagnosis not present

## 2016-06-20 DIAGNOSIS — Z8249 Family history of ischemic heart disease and other diseases of the circulatory system: Secondary | ICD-10-CM | POA: Insufficient documentation

## 2016-06-20 DIAGNOSIS — Z9049 Acquired absence of other specified parts of digestive tract: Secondary | ICD-10-CM | POA: Insufficient documentation

## 2016-06-20 DIAGNOSIS — Z90710 Acquired absence of both cervix and uterus: Secondary | ICD-10-CM | POA: Diagnosis not present

## 2016-06-20 DIAGNOSIS — Z8371 Family history of colonic polyps: Secondary | ICD-10-CM | POA: Insufficient documentation

## 2016-06-20 DIAGNOSIS — R0683 Snoring: Secondary | ICD-10-CM | POA: Insufficient documentation

## 2016-06-20 DIAGNOSIS — M1990 Unspecified osteoarthritis, unspecified site: Secondary | ICD-10-CM | POA: Insufficient documentation

## 2016-06-20 DIAGNOSIS — E785 Hyperlipidemia, unspecified: Secondary | ICD-10-CM | POA: Diagnosis not present

## 2016-06-20 DIAGNOSIS — J449 Chronic obstructive pulmonary disease, unspecified: Secondary | ICD-10-CM | POA: Insufficient documentation

## 2016-06-20 DIAGNOSIS — I481 Persistent atrial fibrillation: Secondary | ICD-10-CM | POA: Diagnosis present

## 2016-06-20 DIAGNOSIS — Z888 Allergy status to other drugs, medicaments and biological substances status: Secondary | ICD-10-CM | POA: Diagnosis not present

## 2016-06-20 DIAGNOSIS — I10 Essential (primary) hypertension: Secondary | ICD-10-CM | POA: Diagnosis not present

## 2016-06-20 DIAGNOSIS — Z800 Family history of malignant neoplasm of digestive organs: Secondary | ICD-10-CM | POA: Insufficient documentation

## 2016-06-20 DIAGNOSIS — Z7901 Long term (current) use of anticoagulants: Secondary | ICD-10-CM | POA: Diagnosis not present

## 2016-06-20 DIAGNOSIS — Z808 Family history of malignant neoplasm of other organs or systems: Secondary | ICD-10-CM | POA: Insufficient documentation

## 2016-06-20 DIAGNOSIS — M4800 Spinal stenosis, site unspecified: Secondary | ICD-10-CM | POA: Diagnosis not present

## 2016-06-20 DIAGNOSIS — K219 Gastro-esophageal reflux disease without esophagitis: Secondary | ICD-10-CM | POA: Insufficient documentation

## 2016-06-20 DIAGNOSIS — I2510 Atherosclerotic heart disease of native coronary artery without angina pectoris: Secondary | ICD-10-CM | POA: Insufficient documentation

## 2016-06-20 DIAGNOSIS — Z91041 Radiographic dye allergy status: Secondary | ICD-10-CM | POA: Diagnosis not present

## 2016-06-20 LAB — CBC
HCT: 37.9 % (ref 36.0–46.0)
Hemoglobin: 12.8 g/dL (ref 12.0–15.0)
MCH: 27.1 pg (ref 26.0–34.0)
MCHC: 33.8 g/dL (ref 30.0–36.0)
MCV: 80.1 fL (ref 78.0–100.0)
Platelets: 271 10*3/uL (ref 150–400)
RBC: 4.73 MIL/uL (ref 3.87–5.11)
RDW: 15.5 % (ref 11.5–15.5)
WBC: 8.8 10*3/uL (ref 4.0–10.5)

## 2016-06-20 LAB — BASIC METABOLIC PANEL
Anion gap: 8 (ref 5–15)
BUN: 14 mg/dL (ref 6–20)
CO2: 24 mmol/L (ref 22–32)
Calcium: 9.2 mg/dL (ref 8.9–10.3)
Chloride: 102 mmol/L (ref 101–111)
Creatinine, Ser: 0.74 mg/dL (ref 0.44–1.00)
GFR calc Af Amer: >60 mL/min (ref >60)
GFR calc non Af Amer: >60 mL/min (ref >60)
Glucose, Bld: 206 mg/dL — ABNORMAL HIGH (ref 65–99)
Potassium: 4.4 mmol/L (ref 3.5–5.1)
Sodium: 134 mmol/L — ABNORMAL LOW (ref 135–145)

## 2016-06-20 NOTE — Progress Notes (Signed)
Patient ID: [name], female   DOB: 1950-12-09, 65 y.o.   MRN: [id number]     Primary Care Physician: Janice Koch, Morrison Referring Physician: Dr. Ronald Morrison [name] is a 65 y.o. female with a h/o CAD, DM, HTN, obesity, PAF first diagnosed May of 2015 with successful DCCV x2, first in May 2015.Marland KitchenShe recently had an AF episode and metoprolol succinate was increased to 150 mg qd. She converted on this dose and has been maintaining SR.  No previous AAD. Chads vasc score of at least 5, on xarelto daily.   On last visit in the afib clinic, we discussed lifestyle triggers. Pt does not  smoke or drink alcohol.Her BP/Hgba1c is controlled. She is sedentary. She does snore, no previous sleep study. Sleep study is pending. We also discussed antiarrythmic, which she was not interested in at that time.  She saw Dr. Gwenlyn Morrison and was in afib. Amlodipine was stopped and cardizem was started. She converted within the day. Discussed AAD therapy  again, but she is still not interested since she is back in rhythm and would prefer to take a wait and see approach.  She is in the afib clinic 09/26/15 with c/o being in afib x 2 weeks. Symptomatic with fatigue and dyspnea with exertion. We discussed options to restore SR. She is not interested in antiarrythmic's at this time, would like a cardioversion. Last one held her in rhythm since 5/16. If she has ERAF, she will consider AAD.  She returns today in SR but is surprised her energy is not much better in SR. She has a mild cough, but mostly fatigue. She has a sleep study, pending. She had a stress test last summer which was low risk. Has noticed some increase in BP.  Pt is being seen again after some months with c/o afib for several weeks. She  has fatigue with afib. Again, options were discussed and she is still very resultant to start antiarrythmic's. She would not be a candidate for  1C agents. She is not interested in being hospitalized for a med and is  concerned re cost of multaq/tiksoyn. Side effects of amiodarone make her hesitatnt to use this drug.  Today, she denies symptoms of palpitations, chest pain, shortness of breath, orthopnea, PND, lower extremity edema, dizziness, presyncope, syncope, or neurologic sequela. The patient is tolerating medications without difficulties and is otherwise without complaint today.   Past Medical History:  Diagnosis Date  . Anemia, unspecified   . Arthritis   . Asthma   . Atrial fibrillation with RVR ([place])    a. on Xarelto  . Bell's palsy    Facial nerve decompression in 2001  . Chronic low back pain   . COPD with asthma ([place])   . Coronary artery disease    Myoview 04/12/11 was entirely normal. ECHO 02/26/08 showed only minor abnormalities. Stenting 05/26/08 of her posterolateral branch to the left circumflex coronary artery. Used a 2.5x88mm Taxus Monorail stent.myoview 2014 was without ischemia  . Diabetes mellitus    Type 2  . GERD (gastroesophageal reflux disease)   . Glaucoma   . Goiter   . Heart murmur   . History of nuclear stress test 2012; 2014   lexiscan; normal pattern of perfusion; normal, low risk scan   . Hyperlipidemia   . Hypertension   . Pneumonia 2008  . Polycystic ovary disease    Hysterectomy in 1982 for this  . Shortness of breath dyspnea    ECHO  02/26/08 showed only minor abnormalities  . Spinal stenosis    Past Surgical History:  Procedure Laterality Date  . ABDOMINAL HYSTERECTOMY  1982   & BSO; for polycystic ovary disease  . CARDIOVERSION N/A 12/17/2013   Procedure: CARDIOVERSION;  Surgeon: [name];  Location: [facility] ENDOSCOPY;  Service: Cardiovascular;  Laterality: N/A;  . CARDIOVERSION N/A 09/30/2015   Procedure: CARDIOVERSION;  Surgeon: Janice Spark, Morrison;  Location: [place];  Service: Cardiovascular;  Laterality: N/A;  . COLONOSCOPY     last 2009; Dr [name]; due 2019  . CORONARY ANGIOPLASTY  05/26/2008   Stenting of her posterolateral branch  to the left circumflex coronary artery. Used a 2.5x20mm Taxus Monorail stent.  [name] FACIAL NERVE DECOMPRESSION  2001/2002   bells palsy   . LAPAROSCOPIC CHOLECYSTECTOMY  06/15/2011    Dr [name]  . TEE WITHOUT CARDIOVERSION N/A 12/17/2013   Procedure: TRANSESOPHAGEAL ECHOCARDIOGRAM (TEE);  Surgeon: [name];  Location[facility] ENDOSCOPY;  Service: Cardiovascular;  Laterality: N/A;  trish/ja  . TRANSTHORACIC ECHOCARDIOGRAM  123456   LV systolic function normal with mild conc LVH; LA mildly dilated; trace MR/TR  . UPPER GI ENDOSCOPY  2009   negative    Current Outpatient Prescriptions  Medication Sig Dispense Refill  . ascorbic acid (VITAMIN C) 1000 MG tablet Take 1,000 mg by mouth 2 (two) times daily.     [name] CALCIUM-MAG-VIT C-VIT D PO Take 2 tablets by mouth daily.     . celecoxib (CELEBREX) 200 MG capsule Take 200 mg by mouth 2 (two) times daily.     . CRESTOR 10 MG tablet TAKE ONE TABLET AT BEDTIME. 30 tablet 7  . diltiazem (CARDIZEM CD) 180 MG 24 hr capsule TAKE 1 CAPSULE DAILY. 30 capsule 11  . estradiol (ESTRACE) 0.5 MG tablet Take 0.5 mg by mouth daily.     [name] gabapentin (NEURONTIN) 100 MG capsule Take 1 capsule (100 mg total) by mouth 3 (three) times daily. 180 capsule 3  . INVOKANA 100 MG TABS tablet TAKE 1 TABLET ONCE DAILY. 90 tablet 2  . isosorbide mononitrate (IMDUR) 30 MG 24 hr tablet TAKE 1 TABLET ONCE DAILY. 30 tablet 11  . latanoprost (XALATAN) 0.005 % ophthalmic solution Place 1 drop into both eyes at bedtime.    . levalbuterol (XOPENEX) 0.63 MG/3ML nebulizer solution Take 3 mLs (0.63 mg total) by nebulization every 6 (six) hours as needed for wheezing or shortness of breath. 75 mL 1  . LORazepam (ATIVAN) 0.5 MG tablet Take 1 mg by mouth at bedtime.     . Melatonin 5 MG TABS Take 1 tablet by mouth at bedtime.     . metoprolol succinate (TOPROL-XL) 50 MG 24 hr tablet Take 3 tablets (150 mg total) by mouth daily. 90 tablet 11  . metoprolol tartrate (LOPRESSOR) 25 MG tablet  TAKE 1 TABLET AS NEEDED FOR IRREGULAR HEART RATE. 15 tablet 1  . Multiple Vitamins-Iron (MULTIVITAMINS WITH IRON) TABS Take 1 tablet by mouth 2 (two) times daily.     [name] omega-3 acid ethyl esters (LOVAZA) 1 G capsule Take 1 g by mouth daily.     [name] omeprazole (PRILOSEC) 20 MG capsule Take 20 mg by mouth daily.     [name] DELICA LANCETS FINE MISC 1 Units by Does not apply route as directed. 100 each 3  . PARoxetine (PAXIL) 10 MG tablet Take 25 mg by mouth daily.     . quinapril (ACCUPRIL) 40 MG tablet Take 1 tablet (  40 mg total) by mouth daily. 90 tablet 3  . senna (SENOKOT) 8.6 MG TABS Take 1 tablet by mouth daily.     . sitaGLIPtin-metformin (JANUMET) 50-1000 MG tablet 1/2 tablet in the morning and 1 tablet in the evening 140 tablet 3  . SYMBICORT 160-4.5 MCG/ACT inhaler USE 2 PUFFS TWICE DAILY AS NEEDED FOR SHORTNESS OF BREATH. 10.2 g 0  . XARELTO 20 MG TABS tablet TAKE 1 TABLET EACH DAY. 30 tablet 5  . XOPENEX HFA 45 MCG/ACT inhaler USE 1 TO 2 PUFFS EVERY 6 HOURS AS NEEDED FOR WHEEZING. 15 g 0  . guaiFENesin (MUCINEX) 600 MG 12 hr tablet Take 600 mg by mouth 2 (two) times daily.     No current facility-administered medications for this encounter.     Allergies  Allergen Reactions  . Benadryl [Diphenhydramine Hcl] Other (See Comments)    Restless leg  . Clopidogrel Bisulfate Nausea Only    Nausea & pain  . Contrast Media [Iodinated Diagnostic Agents] Palpitations    Rapid heart rate, hot    Social History   Social History  . Marital status: Married    Spouse name: N/A  . Number of children: 2  . Years of education: master's   Occupational History  . 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 Smoking status: Former Smoker    Packs/day: 1.50    Years: 30.00    Types: Cigarettes    Quit date: 08/13/1993  . Smokeless tobacco: Never Used  . Alcohol use No  . Drug use: No  . Sexual activity: Not on file   Other Topics Concern  . Not on file     Social History Narrative  . No narrative on file    Family History  Problem Relation Age of Onset  . Heart attack Father 84    2nd MI at 21  . Colon cancer Brother 40  . Gout Brother   . Diabetes Neg Hx   . Stroke Neg Hx   . Ulcers Mother   . Emphysema Mother 51  . Colon polyps Sister   . Cancer Sister     Basal cell carcinoma  . Pneumonia Maternal Grandmother   . Hypertension Brother   . Hyperlipidemia Brother   . Cancer Brother     Skin    ROS- All systems are reviewed and negative except as per the HPI above  Physical Exam: Vitals:   06/20/16 1410  BP: 118/64  Pulse: 92  Weight: 198 lb 12.8 oz (90.2 kg)  Height: 5\' 2"  (1.575 m)    GEN- The patient is well appearing, alert and oriented x 3 today.   Head- normocephalic, atraumatic Eyes-  Sclera clear, conjunctiva pink Ears- hearing intact Oropharynx- clear Neck- supple, no JVP Lymph- no cervical lymphadenopathy Lungs- Clear to ausculation bilaterally, normal work of breathing Heart- irregular rate and rhythm, no murmurs, rubs or gallops, PMI not laterally displaced GI- soft, NT, ND, + BS Extremities- no clubbing, cyanosis, or edema MS- no significant deformity or atrophy Skin- no rash or lesion Psych- euthymic mood, full affect Neuro- strength and sensation are intact  EKG- afib at 92 bpm,  qrs int 80 ms, qtc 457 ms Epic records reviewed  Echo- TEE- 12/17/13- Left ventricle: The cavity size was normal. There was mild concentric hypertrophy. Systolic function was normal. The estimated ejection fraction was in the range of 55% to 60%. Wall motion was normal; there were no regional wall  motion abnormalities. - Aortic valve: No evidence of vegetation. - Aorta: The aorta was normal, not dilated, and non-diseased. - Mitral valve: No evidence of vegetation. Trivial regurgitation. - Left atrium: No evidence of thrombus in the atrial cavity or appendage. - Right atrium: No evidence of thrombus  in the atrial cavity or appendage. - Atrial septum: There was a patent foramen ovale. - Tricuspid valve: No evidence of vegetation. Trivial regurgitation. - Pulmonic valve: No evidence of vegetation.  Surface echo-02/26/08- Left atrium mildly dilated   Assessment and Plan:  1. Symptomatic persistent afib Successful cardioversion in 09/2015 but has  returned to afib. Feels fatigued, dyspnea with exertion   Discussed all antiarrythmic's and she is still not willing to commit to any of them Will scheduled for DCCV No missed doses of xarelto  2. Lifestyle risk factors Pt states 40 lb weight loss and congratulated on her success and to continue with efforts Has spinal stenosis and does not exercise Encouraged to water walk or try recumbent bike which the pt thinks she can do without pain.  3. Snoring/possible sleep apnea Pt is scheduled for sleep study which has been rescheduled 3x, not due to pt, and could be contributing to symptoms fi she has sleep apnea  4. HTN stable  5. CAD Stress test within the last year low risk for ischemia No current angina symptoms  Update echo in SR F/u in afib clinic in one week following cardioversion  Butch Penny C. Kenasia Scheller, [facility] [address] [place], [place] [address] [phone]

## 2016-06-20 NOTE — Patient Instructions (Addendum)
Cardioversion scheduled for Friday, November 10th  - Arrive at the [facility] and go to admitting at 12:30pm  -Do not eat or drink anything after midnight the night prior to your procedure.  - Take all your medication with a sip of water prior to arrival.  - You will not be able to drive home after your procedure.

## 2016-06-22 ENCOUNTER — Encounter (HOSPITAL_COMMUNITY)
Admission: RE | Disposition: A | Discharge status: Home / Self Care | Payer: Self-pay | Source: Ambulatory Visit | Attending: Cardiovascular Disease

## 2016-06-22 ENCOUNTER — Ambulatory Visit (HOSPITAL_COMMUNITY): Payer: Medicare Other | Admitting: Anesthesiology

## 2016-06-22 ENCOUNTER — Encounter (HOSPITAL_COMMUNITY): Payer: Self-pay

## 2016-06-22 ENCOUNTER — Ambulatory Visit (HOSPITAL_COMMUNITY)
Admission: RE | Admit: 2016-06-22 | Discharge: 2016-06-22 | Disposition: A | Discharge status: Home / Self Care | Payer: Medicare Other | Source: Ambulatory Visit | Attending: Cardiovascular Disease | Admitting: Cardiovascular Disease

## 2016-06-22 DIAGNOSIS — H409 Unspecified glaucoma: Secondary | ICD-10-CM | POA: Insufficient documentation

## 2016-06-22 DIAGNOSIS — E119 Type 2 diabetes mellitus without complications: Secondary | ICD-10-CM | POA: Insufficient documentation

## 2016-06-22 DIAGNOSIS — Z955 Presence of coronary angioplasty implant and graft: Secondary | ICD-10-CM | POA: Insufficient documentation

## 2016-06-22 DIAGNOSIS — Z87891 Personal history of nicotine dependence: Secondary | ICD-10-CM | POA: Diagnosis not present

## 2016-06-22 DIAGNOSIS — K219 Gastro-esophageal reflux disease without esophagitis: Secondary | ICD-10-CM | POA: Insufficient documentation

## 2016-06-22 DIAGNOSIS — E785 Hyperlipidemia, unspecified: Secondary | ICD-10-CM | POA: Insufficient documentation

## 2016-06-22 DIAGNOSIS — Z6836 Body mass index (BMI) 36.0-36.9, adult: Secondary | ICD-10-CM | POA: Diagnosis not present

## 2016-06-22 DIAGNOSIS — Z79899 Other long term (current) drug therapy: Secondary | ICD-10-CM | POA: Diagnosis not present

## 2016-06-22 DIAGNOSIS — I2510 Atherosclerotic heart disease of native coronary artery without angina pectoris: Secondary | ICD-10-CM | POA: Diagnosis not present

## 2016-06-22 DIAGNOSIS — M4800 Spinal stenosis, site unspecified: Secondary | ICD-10-CM | POA: Insufficient documentation

## 2016-06-22 DIAGNOSIS — G709 Myoneural disorder, unspecified: Secondary | ICD-10-CM | POA: Diagnosis not present

## 2016-06-22 DIAGNOSIS — D649 Anemia, unspecified: Secondary | ICD-10-CM | POA: Diagnosis not present

## 2016-06-22 DIAGNOSIS — M1990 Unspecified osteoarthritis, unspecified site: Secondary | ICD-10-CM | POA: Insufficient documentation

## 2016-06-22 DIAGNOSIS — I10 Essential (primary) hypertension: Secondary | ICD-10-CM | POA: Insufficient documentation

## 2016-06-22 DIAGNOSIS — Z7901 Long term (current) use of anticoagulants: Secondary | ICD-10-CM | POA: Diagnosis not present

## 2016-06-22 DIAGNOSIS — I739 Peripheral vascular disease, unspecified: Secondary | ICD-10-CM | POA: Insufficient documentation

## 2016-06-22 DIAGNOSIS — J449 Chronic obstructive pulmonary disease, unspecified: Secondary | ICD-10-CM | POA: Insufficient documentation

## 2016-06-22 DIAGNOSIS — I481 Persistent atrial fibrillation: Secondary | ICD-10-CM | POA: Diagnosis present

## 2016-06-22 DIAGNOSIS — G8929 Other chronic pain: Secondary | ICD-10-CM | POA: Diagnosis not present

## 2016-06-22 DIAGNOSIS — Z7984 Long term (current) use of oral hypoglycemic drugs: Secondary | ICD-10-CM | POA: Diagnosis not present

## 2016-06-22 HISTORY — PX: CARDIOVERSION: SHX1299

## 2016-06-22 SURGERY — CARDIOVERSION
Anesthesia: General

## 2016-06-22 MED ORDER — LIDOCAINE HCL (CARDIAC) 20 MG/ML IV SOLN
INTRAVENOUS | Status: DC | PRN
  Administered 2016-06-22: 100 mg via INTRAVENOUS

## 2016-06-22 MED ORDER — PROPOFOL 10 MG/ML IV BOLUS
INTRAVENOUS | Status: DC | PRN
  Administered 2016-06-22: 100 mg via INTRAVENOUS

## 2016-06-22 MED ORDER — SODIUM CHLORIDE 0.9 % IV SOLN
INTRAVENOUS | Status: DC
  Administered 2016-06-22: 14:00:00 via INTRAVENOUS

## 2016-06-22 NOTE — Anesthesia Postprocedure Evaluation (Signed)
Anesthesia Post Note  Patient: [name]  Procedure(s) Performed: Procedure(s) (LRB): CARDIOVERSION (N/A)  Patient location during evaluation: Endoscopy Anesthesia Type: General Level of consciousness: awake Pain management: pain level controlled Vital Signs Assessment: post-procedure vital signs reviewed and stable Respiratory status: spontaneous breathing Cardiovascular status: stable Postop Assessment: no signs of nausea or vomiting Anesthetic complications: no    Last Vitals:  Vitals:   06/22/16 1355 06/22/16 1400  BP: (!) 137/59 (!) 122/55  Pulse: (!) 57 (!) 55  Resp: 13 12  Temp:      Last Pain:  Vitals:   06/22/16 1303  TempSrc: Oral                 Natividad Schlosser

## 2016-06-22 NOTE — Anesthesia Procedure Notes (Signed)
Procedure Name: MAC Date/Time: 06/22/2016 1:47 PM Performed by: Neldon Newport Pre-anesthesia Checklist: Timeout performed, Patient being monitored, Emergency Drugs available, Patient identified and Suction available Patient Re-evaluated:Patient Re-evaluated prior to inductionOxygen Delivery Method: Ambu bag and Nasal cannula Preoxygenation: Pre-oxygenation with 100% oxygen

## 2016-06-22 NOTE — Transfer of Care (Signed)
Immediate Anesthesia Transfer of Care Note  Patient: [name]  Procedure(s) Performed: Procedure(s): CARDIOVERSION (N/A)  Patient Location: Endoscopy Unit  Anesthesia Type:General  Level of Consciousness: awake, alert  and oriented  Airway & Oxygen Therapy: Patient Spontanous Breathing and Patient connected to nasal cannula oxygen  Post-op Assessment: Report given to RN, Post -op Vital signs reviewed and stable, Patient moving all extremities X 4 and Patient able to stick tongue midline  Post vital signs: Reviewed and stable  Last Vitals:  Vitals:   06/22/16 1303  BP: (!) 141/65  Pulse: 98  Resp: 19  Temp: 36.6 C    Last Pain:  Vitals:   06/22/16 1303  TempSrc: Oral         Complications: No apparent anesthesia complications

## 2016-06-22 NOTE — Interval H&P Note (Signed)
History and Physical Interval Note:  06/22/2016 1:27 PM  [name]  has presented today for surgery, with the diagnosis of afib  The various methods of treatment have been discussed with the patient and family. After consideration of risks, benefits and other options for treatment, the patient has consented to  Procedure(s): CARDIOVERSION (N/A) as a surgical intervention .  The patient's history has been reviewed, patient examined, no change in status, stable for surgery.  I have reviewed the patient's chart and labs.  Questions were answered to the patient's satisfaction.    Thelton Graca C. Oval Linsey, MD, [facility]  06/22/2016  1:28 PM

## 2016-06-22 NOTE — CV Procedure (Signed)
Electrical Cardioversion Procedure Note SERAS KOONS [id number] [birth year]  Procedure: Electrical Cardioversion Indications:  Atrial Fibrillation  Procedure Details Consent: Risks of procedure as well as the alternatives and risks of each were explained to the (patient/caregiver).  Consent for procedure obtained. Time Out: Verified patient identification, verified procedure, site/side was marked, verified correct patient position, special equipment/implants available, medications/allergies/relevent history reviewed, required imaging and test results available.  Performed  Patient placed on cardiac monitor, pulse oximetry, supplemental oxygen as necessary.  Sedation given: Propofol 100 mg Pacer pads placed anterior and posterior chest.  Cardioverted 3 time(s).  Cardioverted at 150J, 200J, 200J.  First two attempts were unsuccessful.   Evaluation Findings: Post procedure EKG shows: NSR Complications: None Patient did tolerate procedure well.   Skeet Latch, MD 06/22/2016, 1:51 PM

## 2016-06-22 NOTE — H&P (View-Only) (Signed)
Patient ID: [name], female   DOB: Apr 10, 1951, 65 y.o.   MRN: [id number]     Primary Care Physician: Hoyt Koch, MD Referring Physician: Dr. Ronald Lobo [name] is a 65 y.o. female with a h/o CAD, DM, HTN, obesity, PAF first diagnosed May of 2015 with successful DCCV x2, first in May 2015.Marland KitchenShe recently had an AF episode and metoprolol succinate was increased to 150 mg qd. She converted on this dose and has been maintaining SR.  No previous AAD. Chads vasc score of at least 5, on xarelto daily.   On last visit in the afib clinic, we discussed lifestyle triggers. Pt does not  smoke or drink alcohol.Her BP/Hgba1c is controlled. She is sedentary. She does snore, no previous sleep study. Sleep study is pending. We also discussed antiarrythmic, which she was not interested in at that time.  She saw Dr. Gwenlyn Found and was in afib. Amlodipine was stopped and cardizem was started. She converted within the day. Discussed AAD therapy  again, but she is still not interested since she is back in rhythm and would prefer to take a wait and see approach.  She is in the afib clinic 09/26/15 with c/o being in afib x 2 weeks. Symptomatic with fatigue and dyspnea with exertion. We discussed options to restore SR. She is not interested in antiarrythmic's at this time, would like a cardioversion. Last one held her in rhythm since 5/16. If she has ERAF, she will consider AAD.  She returns today in SR but is surprised her energy is not much better in SR. She has a mild cough, but mostly fatigue. She has a sleep study, pending. She had a stress test last summer which was low risk. Has noticed some increase in BP.  Pt is being seen again after some months with c/o afib for several weeks. She  has fatigue with afib. Again, options were discussed and she is still very resultant to start antiarrythmic's. She would not be a candidate for  1C agents. She is not interested in being hospitalized for a med and is  concerned re cost of multaq/tiksoyn. Side effects of amiodarone make her hesitatnt to use this drug.  Today, she denies symptoms of palpitations, chest pain, shortness of breath, orthopnea, PND, lower extremity edema, dizziness, presyncope, syncope, or neurologic sequela. The patient is tolerating medications without difficulties and is otherwise without complaint today.   Past Medical History:  Diagnosis Date  . Anemia, unspecified   . Arthritis   . Asthma   . Atrial fibrillation with RVR ([place])    a. on Xarelto  . Bell's palsy    Facial nerve decompression in 2001  . Chronic low back pain   . COPD with asthma ([place])   . Coronary artery disease    Myoview 04/12/11 was entirely normal. ECHO 02/26/08 showed only minor abnormalities. Stenting 05/26/08 of her posterolateral branch to the left circumflex coronary artery. Used a 2.5x23mm Taxus Monorail stent.myoview 2014 was without ischemia  . Diabetes mellitus    Type 2  . GERD (gastroesophageal reflux disease)   . Glaucoma   . Goiter   . Heart murmur   . History of nuclear stress test 2012; 2014   lexiscan; normal pattern of perfusion; normal, low risk scan   . Hyperlipidemia   . Hypertension   . Pneumonia 2008  . Polycystic ovary disease    Hysterectomy in 1982 for this  . Shortness of breath dyspnea    ECHO  02/26/08 showed only minor abnormalities  . Spinal stenosis    Past Surgical History:  Procedure Laterality Date  . ABDOMINAL HYSTERECTOMY  1982   & BSO; for polycystic ovary disease  . CARDIOVERSION N/A 12/17/2013   Procedure: CARDIOVERSION;  Surgeon: Pixie Casino, MD;  Location: [facility] ENDOSCOPY;  Service: Cardiovascular;  Laterality: N/A;  . CARDIOVERSION N/A 09/30/2015   Procedure: CARDIOVERSION;  Surgeon: Dorothy Spark, MD;  Location: [place];  Service: Cardiovascular;  Laterality: N/A;  . COLONOSCOPY     last 2009; Dr Cristina Gong; due 2019  . CORONARY ANGIOPLASTY  05/26/2008   Stenting of her posterolateral branch  to the left circumflex coronary artery. Used a 2.5x90mm Taxus Monorail stent.  [name] FACIAL NERVE DECOMPRESSION  2001/2002   bells palsy   . LAPAROSCOPIC CHOLECYSTECTOMY  06/15/2011    Dr Dalbert Batman  . TEE WITHOUT CARDIOVERSION N/A 12/17/2013   Procedure: TRANSESOPHAGEAL ECHOCARDIOGRAM (TEE);  Surgeon: Pixie Casino, MD;  Location: [facility] ENDOSCOPY;  Service: Cardiovascular;  Laterality: N/A;  trish/ja  . TRANSTHORACIC ECHOCARDIOGRAM  123456   LV systolic function normal with mild conc LVH; LA mildly dilated; trace MR/TR  . UPPER GI ENDOSCOPY  2009   negative    Current Outpatient Prescriptions  Medication Sig Dispense Refill  . ascorbic acid (VITAMIN C) 1000 MG tablet Take 1,000 mg by mouth 2 (two) times daily.     [name] CALCIUM-MAG-VIT C-VIT D PO Take 2 tablets by mouth daily.     . celecoxib (CELEBREX) 200 MG capsule Take 200 mg by mouth 2 (two) times daily.     . CRESTOR 10 MG tablet TAKE ONE TABLET AT BEDTIME. 30 tablet 7  . diltiazem (CARDIZEM CD) 180 MG 24 hr capsule TAKE 1 CAPSULE DAILY. 30 capsule 11  . estradiol (ESTRACE) 0.5 MG tablet Take 0.5 mg by mouth daily.     [name] gabapentin (NEURONTIN) 100 MG capsule Take 1 capsule (100 mg total) by mouth 3 (three) times daily. 180 capsule 3  . INVOKANA 100 MG TABS tablet TAKE 1 TABLET ONCE DAILY. 90 tablet 2  . isosorbide mononitrate (IMDUR) 30 MG 24 hr tablet TAKE 1 TABLET ONCE DAILY. 30 tablet 11  . latanoprost (XALATAN) 0.005 % ophthalmic solution Place 1 drop into both eyes at bedtime.    . levalbuterol (XOPENEX) 0.63 MG/3ML nebulizer solution Take 3 mLs (0.63 mg total) by nebulization every 6 (six) hours as needed for wheezing or shortness of breath. 75 mL 1  . LORazepam (ATIVAN) 0.5 MG tablet Take 1 mg by mouth at bedtime.     . Melatonin 5 MG TABS Take 1 tablet by mouth at bedtime.     . metoprolol succinate (TOPROL-XL) 50 MG 24 hr tablet Take 3 tablets (150 mg total) by mouth daily. 90 tablet 11  . metoprolol tartrate (LOPRESSOR) 25 MG tablet  TAKE 1 TABLET AS NEEDED FOR IRREGULAR HEART RATE. 15 tablet 1  . Multiple Vitamins-Iron (MULTIVITAMINS WITH IRON) TABS Take 1 tablet by mouth 2 (two) times daily.     [name] omega-3 acid ethyl esters (LOVAZA) 1 G capsule Take 1 g by mouth daily.     [name] omeprazole (PRILOSEC) 20 MG capsule Take 20 mg by mouth daily.     Glory Rosebush DELICA LANCETS FINE MISC 1 Units by Does not apply route as directed. 100 each 3  . PARoxetine (PAXIL) 10 MG tablet Take 25 mg by mouth daily.     . quinapril (ACCUPRIL) 40 MG tablet Take 1 tablet (  40 mg total) by mouth daily. 90 tablet 3  . senna (SENOKOT) 8.6 MG TABS Take 1 tablet by mouth daily.     . sitaGLIPtin-metformin (JANUMET) 50-1000 MG tablet 1/2 tablet in the morning and 1 tablet in the evening 140 tablet 3  . SYMBICORT 160-4.5 MCG/ACT inhaler USE 2 PUFFS TWICE DAILY AS NEEDED FOR SHORTNESS OF BREATH. 10.2 g 0  . XARELTO 20 MG TABS tablet TAKE 1 TABLET EACH DAY. 30 tablet 5  . XOPENEX HFA 45 MCG/ACT inhaler USE 1 TO 2 PUFFS EVERY 6 HOURS AS NEEDED FOR WHEEZING. 15 g 0  . guaiFENesin (MUCINEX) 600 MG 12 hr tablet Take 600 mg by mouth 2 (two) times daily.     No current facility-administered medications for this encounter.     Allergies  Allergen Reactions  . Benadryl [Diphenhydramine Hcl] Other (See Comments)    Restless leg  . Clopidogrel Bisulfate Nausea Only    Nausea & pain  . Contrast Media [Iodinated Diagnostic Agents] Palpitations    Rapid heart rate, hot    Social History   Social History  . Marital status: Married    Spouse name: N/A  . Number of children: 2  . Years of education: master's   Occupational History  . 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 Smoking status: Former Smoker    Packs/day: 1.50    Years: 30.00    Types: Cigarettes    Quit date: 08/13/1993  . Smokeless tobacco: Never Used  . Alcohol use No  . Drug use: No  . Sexual activity: Not on file   Other Topics Concern  . Not on file     Social History Narrative  . No narrative on file    Family History  Problem Relation Age of Onset  . Heart attack Father 49    2nd MI at 71  . Colon cancer Brother 62  . Gout Brother   . Diabetes Neg Hx   . Stroke Neg Hx   . Ulcers Mother   . Emphysema Mother 60  . Colon polyps Sister   . Cancer Sister     Basal cell carcinoma  . Pneumonia Maternal Grandmother   . Hypertension Brother   . Hyperlipidemia Brother   . Cancer Brother     Skin    ROS- All systems are reviewed and negative except as per the HPI above  Physical Exam: Vitals:   06/20/16 1410  BP: 118/64  Pulse: 92  Weight: 198 lb 12.8 oz (90.2 kg)  Height: 5\' 2"  (1.575 m)    GEN- The patient is well appearing, alert and oriented x 3 today.   Head- normocephalic, atraumatic Eyes-  Sclera clear, conjunctiva pink Ears- hearing intact Oropharynx- clear Neck- supple, no JVP Lymph- no cervical lymphadenopathy Lungs- Clear to ausculation bilaterally, normal work of breathing Heart- irregular rate and rhythm, no murmurs, rubs or gallops, PMI not laterally displaced GI- soft, NT, ND, + BS Extremities- no clubbing, cyanosis, or edema MS- no significant deformity or atrophy Skin- no rash or lesion Psych- euthymic mood, full affect Neuro- strength and sensation are intact  EKG- afib at 92 bpm,  qrs int 80 ms, qtc 457 ms Epic records reviewed  Echo- TEE- 12/17/13- Left ventricle: The cavity size was normal. There was mild concentric hypertrophy. Systolic function was normal. The estimated ejection fraction was in the range of 55% to 60%. Wall motion was normal; there were no regional wall  motion abnormalities. - Aortic valve: No evidence of vegetation. - Aorta: The aorta was normal, not dilated, and non-diseased. - Mitral valve: No evidence of vegetation. Trivial regurgitation. - Left atrium: No evidence of thrombus in the atrial cavity or appendage. - Right atrium: No evidence of thrombus  in the atrial cavity or appendage. - Atrial septum: There was a patent foramen ovale. - Tricuspid valve: No evidence of vegetation. Trivial regurgitation. - Pulmonic valve: No evidence of vegetation.  Surface echo-02/26/08- Left atrium mildly dilated   Assessment and Plan:  1. Symptomatic persistent afib Successful cardioversion in 09/2015 but has  returned to afib. Feels fatigued, dyspnea with exertion   Discussed all antiarrythmic's and she is still not willing to commit to any of them Will scheduled for DCCV No missed doses of xarelto  2. Lifestyle risk factors Pt states 40 lb weight loss and congratulated on her success and to continue with efforts Has spinal stenosis and does not exercise Encouraged to water walk or try recumbent bike which the pt thinks she can do without pain.  3. Snoring/possible sleep apnea Pt is scheduled for sleep study which has been rescheduled 3x, not due to pt, and could be contributing to symptoms fi she has sleep apnea  4. HTN stable  5. CAD Stress test within the last year low risk for ischemia No current angina symptoms  Update echo in SR F/u in afib clinic in one week following cardioversion  Butch Penny C. Maisie Hauser, [facility] [address] [place], [place] [address] [phone]

## 2016-06-22 NOTE — Anesthesia Preprocedure Evaluation (Addendum)
Anesthesia Evaluation  Patient identified by MRN, date of birth, ID band Patient awake    Reviewed: Allergy & Precautions, H&P , NPO status , Patient's Chart, lab work & pertinent test results  Airway Mallampati: II  TM Distance: >3 FB Neck ROM: full    Dental  (+) Teeth Intact, Dental Advidsory Given   Pulmonary shortness of breath, asthma , former smoker,    Pulmonary exam normal breath sounds clear to auscultation       Cardiovascular hypertension, + CAD and + Peripheral Vascular Disease  + Valvular Problems/Murmurs  Rhythm:Irregular Rate:Abnormal     Neuro/Psych  Neuromuscular disease    GI/Hepatic GERD  Medicated and Controlled,  Endo/Other  diabetes  Renal/GU      Musculoskeletal  (+) Arthritis ,   Abdominal   Peds  Hematology  (+) anemia ,   Anesthesia Other Findings   Reproductive/Obstetrics                           Anesthesia Physical Anesthesia Plan  ASA: III  Anesthesia Plan: General   Post-op Pain Management:    Induction: Intravenous  Airway Management Planned: Simple Face Mask  Additional Equipment: None  Intra-op Plan:   Post-operative Plan:   Informed Consent: I have reviewed the patients History and Physical, chart, labs and discussed the procedure including the risks, benefits and alternatives for the proposed anesthesia with the patient or authorized representative who has indicated his/her understanding and acceptance.   Dental Advisory Given and Dental advisory given  Plan Discussed with: Surgeon, CRNA and Anesthesiologist  Anesthesia Plan Comments:       Anesthesia Quick Evaluation

## 2016-06-25 ENCOUNTER — Encounter (HOSPITAL_COMMUNITY): Payer: Self-pay | Admitting: Cardiovascular Disease

## 2016-06-27 ENCOUNTER — Ambulatory Visit: Payer: Medicare Other | Admitting: Cardiology

## 2016-06-27 ENCOUNTER — Encounter (HOSPITAL_COMMUNITY): Payer: Self-pay | Admitting: Nurse Practitioner

## 2016-06-27 ENCOUNTER — Ambulatory Visit (HOSPITAL_COMMUNITY)
Admission: RE | Admit: 2016-06-27 | Discharge: 2016-06-27 | Disposition: A | Discharge status: Home / Self Care | Payer: Medicare Other | Source: Ambulatory Visit | Attending: Nurse Practitioner | Admitting: Nurse Practitioner

## 2016-06-27 VITALS — BP 132/74 | HR 96 | Ht 62.0 in | Wt 198.2 lb

## 2016-06-27 DIAGNOSIS — Z7901 Long term (current) use of anticoagulants: Secondary | ICD-10-CM | POA: Diagnosis not present

## 2016-06-27 DIAGNOSIS — R0683 Snoring: Secondary | ICD-10-CM | POA: Insufficient documentation

## 2016-06-27 DIAGNOSIS — I481 Persistent atrial fibrillation: Secondary | ICD-10-CM | POA: Insufficient documentation

## 2016-06-27 DIAGNOSIS — M1990 Unspecified osteoarthritis, unspecified site: Secondary | ICD-10-CM | POA: Insufficient documentation

## 2016-06-27 DIAGNOSIS — K219 Gastro-esophageal reflux disease without esophagitis: Secondary | ICD-10-CM | POA: Diagnosis not present

## 2016-06-27 DIAGNOSIS — Z7984 Long term (current) use of oral hypoglycemic drugs: Secondary | ICD-10-CM | POA: Diagnosis not present

## 2016-06-27 DIAGNOSIS — J449 Chronic obstructive pulmonary disease, unspecified: Secondary | ICD-10-CM | POA: Insufficient documentation

## 2016-06-27 DIAGNOSIS — E119 Type 2 diabetes mellitus without complications: Secondary | ICD-10-CM | POA: Diagnosis not present

## 2016-06-27 DIAGNOSIS — Z823 Family history of stroke: Secondary | ICD-10-CM | POA: Diagnosis not present

## 2016-06-27 DIAGNOSIS — G8929 Other chronic pain: Secondary | ICD-10-CM | POA: Diagnosis not present

## 2016-06-27 DIAGNOSIS — Z90710 Acquired absence of both cervix and uterus: Secondary | ICD-10-CM | POA: Insufficient documentation

## 2016-06-27 DIAGNOSIS — Z79899 Other long term (current) drug therapy: Secondary | ICD-10-CM | POA: Insufficient documentation

## 2016-06-27 DIAGNOSIS — I10 Essential (primary) hypertension: Secondary | ICD-10-CM | POA: Diagnosis not present

## 2016-06-27 DIAGNOSIS — M4800 Spinal stenosis, site unspecified: Secondary | ICD-10-CM | POA: Insufficient documentation

## 2016-06-27 DIAGNOSIS — I2510 Atherosclerotic heart disease of native coronary artery without angina pectoris: Secondary | ICD-10-CM | POA: Diagnosis not present

## 2016-06-27 DIAGNOSIS — Z800 Family history of malignant neoplasm of digestive organs: Secondary | ICD-10-CM | POA: Insufficient documentation

## 2016-06-27 DIAGNOSIS — I4819 Other persistent atrial fibrillation: Secondary | ICD-10-CM

## 2016-06-27 DIAGNOSIS — M545 Low back pain: Secondary | ICD-10-CM | POA: Diagnosis not present

## 2016-06-27 DIAGNOSIS — Z825 Family history of asthma and other chronic lower respiratory diseases: Secondary | ICD-10-CM | POA: Insufficient documentation

## 2016-06-27 DIAGNOSIS — Z955 Presence of coronary angioplasty implant and graft: Secondary | ICD-10-CM | POA: Diagnosis not present

## 2016-06-27 DIAGNOSIS — Z888 Allergy status to other drugs, medicaments and biological substances status: Secondary | ICD-10-CM | POA: Insufficient documentation

## 2016-06-27 DIAGNOSIS — E785 Hyperlipidemia, unspecified: Secondary | ICD-10-CM | POA: Diagnosis not present

## 2016-06-27 DIAGNOSIS — Z9889 Other specified postprocedural states: Secondary | ICD-10-CM | POA: Insufficient documentation

## 2016-06-27 DIAGNOSIS — Z87891 Personal history of nicotine dependence: Secondary | ICD-10-CM | POA: Diagnosis not present

## 2016-06-27 DIAGNOSIS — Z8249 Family history of ischemic heart disease and other diseases of the circulatory system: Secondary | ICD-10-CM | POA: Diagnosis not present

## 2016-06-27 DIAGNOSIS — H409 Unspecified glaucoma: Secondary | ICD-10-CM | POA: Insufficient documentation

## 2016-06-27 MED ORDER — METOPROLOL SUCCINATE ER 25 MG PO TB24: 25.0000 mg | ORAL_TABLET | Freq: Every day | ORAL | 6 refills | Status: DC

## 2016-06-27 NOTE — Progress Notes (Signed)
Patient ID: [name], female   DOB: 1951/04/20, 65 y.o.   MRN: [id number]     Primary Care Physician: Hoyt Koch, MD Referring Physician: Dr. Ronald Lobo [name] is a 65 y.o. female with a h/o CAD, DM, HTN, obesity, PAF first diagnosed May of 2015 with successful DCCV x2, first in May 2015.Marland KitchenShe recently had an AF episode and metoprolol succinate was increased to 150 mg qd. She converted on this dose and has been maintaining SR.  No previous AAD. Chads vasc score of at least 5, on xarelto daily.   On last visit in the afib clinic, we discussed lifestyle triggers. Pt does not  smoke or drink alcohol.Her BP/Hgba1c is controlled. She is sedentary. She does snore, no previous sleep study. Sleep study is pending. We also discussed antiarrythmic, which she was not interested in at that time.  She saw Dr. Gwenlyn Found and was in afib. Amlodipine was stopped and cardizem was started. She converted within the day. Discussed AAD therapy  again, but she is still not interested since she is back in rhythm and would prefer to take a wait and see approach.  She is in the afib clinic 09/26/15 with c/o being in afib x 2 weeks. Symptomatic with fatigue and dyspnea with exertion. We discussed options to restore SR. She is not interested in antiarrythmic's at this time, would like a cardioversion. Last one held her in rhythm since 5/16. If she has ERAF, she will consider AAD.  She returns today in SR but is surprised her energy is not much better in SR. She has a mild cough, but mostly fatigue. She has a sleep study, pending. She had a stress test last summer which was low risk. Has noticed some increase in BP.  Pt is being seen again after some months with c/o afib for several weeks. She  has fatigue with afib. Again, options were discussed and she is still very resultant to start antiarrythmic's. She would not be a candidate for  1C agents. She is not interested in being hospitalized for a med and is  concerned re cost of multaq/tiksoyn. Side effects of amiodarone make her hesitatnt to use this drug.  F/u in afib clinic s/p cardioversion one week ago.She unfortunately has had ERAF. She felt great yesterday but was unaware that she was in afib today. Rate controlled but with v rate of upper 90's and could benefit from more rate control. Again, rate control was discussed and she is not candidate for 1c agents, does not want to stop Paxil to allow for tikosyn to be used. She is pending echo 12/1 and a sleep study. Multaq may be an option in conjunction with another cardioversion at a later date.   Today, she denies symptoms of palpitations, chest pain, shortness of breath, orthopnea, PND, lower extremity edema, dizziness, presyncope, syncope, or neurologic sequela. The patient is tolerating medications without difficulties and is otherwise without complaint today.   Past Medical History:  Diagnosis Date  . Anemia, unspecified   . Arthritis   . Asthma   . Atrial fibrillation with RVR ([place])    a. on Xarelto  . Bell's palsy    Facial nerve decompression in 2001  . Chronic low back pain   . COPD with asthma ([place])   . Coronary artery disease    Myoview 04/12/11 was entirely normal. ECHO 02/26/08 showed only minor abnormalities. Stenting 05/26/08 of her posterolateral branch to the left circumflex coronary artery. Used a  2.5x35mm Taxus Monorail stent.myoview 2014 was without ischemia  . Diabetes mellitus    Type 2  . GERD (gastroesophageal reflux disease)   . Glaucoma   . Goiter   . Heart murmur   . History of nuclear stress test 2012; 2014   lexiscan; normal pattern of perfusion; normal, low risk scan   . Hyperlipidemia   . Hypertension   . Pneumonia 2008  . Polycystic ovary disease    Hysterectomy in 1982 for this  . Shortness of breath dyspnea    ECHO 02/26/08 showed only minor abnormalities  . Spinal stenosis    Past Surgical History:  Procedure Laterality Date  . ABDOMINAL  HYSTERECTOMY  1982   & BSO; for polycystic ovary disease  . CARDIOVERSION N/A 12/17/2013   Procedure: CARDIOVERSION;  Surgeon: Pixie Casino, MD;  Location[facility] ENDOSCOPY;  Service: Cardiovascular;  Laterality: N/A;  . CARDIOVERSION N/A 09/30/2015   Procedure: CARDIOVERSION;  Surgeon: Dorothy Spark, MD;  Location: [facility] ENDOSCOPY;  Service: Cardiovascular;  Laterality: N/A;  . CARDIOVERSION N/A 06/22/2016   Procedure: CARDIOVERSION;  Surgeon: Skeet Latch, MD;  Location: [place] [facility];  Service: Cardiovascular;  Laterality: N/A;  . COLONOSCOPY     last 2009; Dr Cristina Gong; due 2019  . CORONARY ANGIOPLASTY  05/26/2008   Stenting of her posterolateral branch to the left circumflex coronary artery. Used a 2.5x35mm Taxus Monorail stent.  [name] FACIAL NERVE DECOMPRESSION  2001/2002   bells palsy   . LAPAROSCOPIC CHOLECYSTECTOMY  06/15/2011    Dr Dalbert Batman  . TEE WITHOUT CARDIOVERSION N/A 12/17/2013   Procedure: TRANSESOPHAGEAL ECHOCARDIOGRAM (TEE);  Surgeon: Pixie Casino, MD;  Location: [facility] ENDOSCOPY;  Service: Cardiovascular;  Laterality: N/A;  trish/ja  . TRANSTHORACIC ECHOCARDIOGRAM  123456   LV systolic function normal with mild conc LVH; LA mildly dilated; trace MR/TR  . UPPER GI ENDOSCOPY  2009   negative    Current Outpatient Prescriptions  Medication Sig Dispense Refill  . ascorbic acid (VITAMIN C) 1000 MG tablet Take 1,000 mg by mouth 2 (two) times daily.     [name] CALCIUM-MAG-VIT C-VIT D PO Take 2 tablets by mouth daily.     . celecoxib (CELEBREX) 200 MG capsule Take 200 mg by mouth 2 (two) times daily.     . CRESTOR 10 MG tablet TAKE ONE TABLET AT BEDTIME. 30 tablet 7  . diltiazem (CARDIZEM CD) 180 MG 24 hr capsule TAKE 1 CAPSULE DAILY. 30 capsule 11  . estradiol (ESTRACE) 0.5 MG tablet Take 0.5 mg by mouth daily.     [name] gabapentin (NEURONTIN) 100 MG capsule Take 1 capsule (100 mg total) by mouth 3 (three) times daily. 180 capsule 3  . guaiFENesin (MUCINEX) 600 MG 12 hr tablet Take 600  mg by mouth 2 (two) times daily.    . INVOKANA 100 MG TABS tablet TAKE 1 TABLET ONCE DAILY. 90 tablet 2  . isosorbide mononitrate (IMDUR) 30 MG 24 hr tablet TAKE 1 TABLET ONCE DAILY. 30 tablet 11  . latanoprost (XALATAN) 0.005 % ophthalmic solution Place 1 drop into both eyes at bedtime.    . levalbuterol (XOPENEX) 0.63 MG/3ML nebulizer solution Take 3 mLs (0.63 mg total) by nebulization every 6 (six) hours as needed for wheezing or shortness of breath. 75 mL 1  . LORazepam (ATIVAN) 0.5 MG tablet Take 1 mg by mouth at bedtime.     . Melatonin 5 MG TABS Take 1 tablet by mouth at bedtime.     . metoprolol succinate (TOPROL-XL)  50 MG 24 hr tablet Take 3 tablets (150 mg total) by mouth daily. 90 tablet 11  . metoprolol tartrate (LOPRESSOR) 25 MG tablet TAKE 1 TABLET AS NEEDED FOR IRREGULAR HEART RATE. 15 tablet 1  . Multiple Vitamins-Iron (MULTIVITAMINS WITH IRON) TABS Take 1 tablet by mouth 2 (two) times daily.     [name] omega-3 acid ethyl esters (LOVAZA) 1 G capsule Take 1 g by mouth daily.     [name] omeprazole (PRILOSEC) 20 MG capsule Take 20 mg by mouth daily.     Glory Rosebush DELICA LANCETS FINE MISC 1 Units by Does not apply route as directed. 100 each 3  . PARoxetine (PAXIL) 10 MG tablet Take 25 mg by mouth daily.     . quinapril (ACCUPRIL) 40 MG tablet Take 1 tablet (40 mg total) by mouth daily. 90 tablet 3  . senna (SENOKOT) 8.6 MG TABS Take 1 tablet by mouth daily.     . sitaGLIPtin-metformin (JANUMET) 50-1000 MG tablet 1/2 tablet in the morning and 1 tablet in the evening 140 tablet 3  . SYMBICORT 160-4.5 MCG/ACT inhaler USE 2 PUFFS TWICE DAILY AS NEEDED FOR SHORTNESS OF BREATH. 10.2 g 0  . XARELTO 20 MG TABS tablet TAKE 1 TABLET EACH DAY. 30 tablet 5  . XOPENEX HFA 45 MCG/ACT inhaler USE 1 TO 2 PUFFS EVERY 6 HOURS AS NEEDED FOR WHEEZING. 15 g 0  . metoprolol succinate (TOPROL XL) 25 MG 24 hr tablet Take 1 tablet (25 mg total) by mouth at bedtime. 30 tablet 6   No current facility-administered  medications for this encounter.     Allergies  Allergen Reactions  . Benadryl [Diphenhydramine Hcl] Other (See Comments)    Restless leg  . Clopidogrel Bisulfate Nausea Only    Nausea & pain  . Contrast Media [Iodinated Diagnostic Agents] Palpitations    Rapid heart rate, hot    Social History   Social History  . Marital status: Married    Spouse name: N/A  . Number of children: 2  . 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 Smoking status: Former Smoker    Packs/day: 1.50    Years: 30.00    Types: Cigarettes    Quit date: 08/13/1993  . Smokeless tobacco: Never Used  . Alcohol use No  . Drug use: No  . Sexual activity: Not on file   Other Topics Concern  . Not on file   Social History Narrative  . No narrative on file    Family History  Problem Relation Age of Onset  . Heart attack Father 44    2nd MI at 31  . Colon cancer Brother 77  . Gout Brother   . Ulcers Mother   . Emphysema Mother 32  . Colon polyps Sister   . Cancer Sister     Basal cell carcinoma  . Pneumonia Maternal Grandmother   . Hypertension Brother   . Hyperlipidemia Brother   . Cancer Brother     Skin  . Diabetes Neg Hx   . Stroke Neg Hx     ROS- All systems are reviewed and negative except as per the HPI above  Physical Exam: Vitals:   06/27/16 1333  BP: 132/74  Pulse: 96  Weight: 198 lb 3.2 oz (89.9 kg)  Height: 5\' 2"  (1.575 m)    GEN- The patient is well appearing, alert and oriented x 3  today.   Head- normocephalic, atraumatic Eyes-  Sclera clear, conjunctiva pink Ears- hearing intact Oropharynx- clear Neck- supple, no JVP Lymph- no cervical lymphadenopathy Lungs- Clear to ausculation bilaterally, normal work of breathing Heart- irregular rate and rhythm, no murmurs, rubs or gallops, PMI not laterally displaced GI- soft, NT, ND, + BS Extremities- no clubbing, cyanosis, or edema MS-  no significant deformity or atrophy Skin- no rash or lesion Psych- euthymic mood, full affect Neuro- strength and sensation are intact  EKG- afib at 96 bpm,  qrs int 82 ms, qtc 447 ms Epic records reviewed  Echo- TEE- 12/17/13- Left ventricle: The cavity size was normal. There was mild concentric hypertrophy. Systolic function was normal. The estimated ejection fraction was in the range of 55% to 60%. Wall motion was normal; there were no regional wall motion abnormalities. - Aortic valve: No evidence of vegetation. - Aorta: The aorta was normal, not dilated, and non-diseased. - Mitral valve: No evidence of vegetation. Trivial regurgitation. - Left atrium: No evidence of thrombus in the atrial cavity or appendage. - Right atrium: No evidence of thrombus in the atrial cavity or appendage. - Atrial septum: There was a patent foramen ovale. - Tricuspid valve: No evidence of vegetation. Trivial regurgitation. - Pulmonic valve: No evidence of vegetation.  Surface echo-02/26/08- Left atrium mildly dilated   Assessment and Plan:  1. Symptomatic persistent afib Successful cardioversion but ERAF Add extra 25 mg metoprolol at hs for better rate control Discussed all antiarrythmic's and she is possibly interested in multaq but would like to see results of echo and have her to have sleep study 11/30 Continue  xarelto  2. Lifestyle risk factors Pt states 40 lb weight loss and congratulated on her success and to continue with efforts Has spinal stenosis and does not exercise Encouraged to water walk or try recumbent bike which the pt thinks she can do without pain.  3. Snoring/possible sleep apnea Pt is scheduled for sleep study which has been rescheduled 3x, not due to pt, and could be contributing to symptoms fi she has sleep apnea  4. HTN stable  5. CAD Stress test within the last year low risk for ischemia No current angina symptoms  F/u after echo  [name]  C. Dayton Kenley, [facility] [address] [place], [place] [address] [phone]

## 2016-06-27 NOTE — Patient Instructions (Signed)
Your physician has recommended you make the following change in your medication:  1)Add metoprolol 25mg  at bedtime.

## 2016-07-02 ENCOUNTER — Ambulatory Visit (INDEPENDENT_AMBULATORY_CARE_PROVIDER_SITE_OTHER): Payer: Medicare Other | Admitting: Family Medicine

## 2016-07-02 VITALS — BP 142/80 | HR 92 | Temp 98.3°F | Resp 16 | Ht 62.0 in | Wt 197.0 lb

## 2016-07-02 DIAGNOSIS — J45901 Unspecified asthma with (acute) exacerbation: Secondary | ICD-10-CM

## 2016-07-02 MED ORDER — LEVALBUTEROL HCL 0.63 MG/3ML IN NEBU: 0.6300 mg | INHALATION_SOLUTION | Freq: Four times a day (QID) | RESPIRATORY_TRACT | 1 refills | Status: DC | PRN

## 2016-07-02 MED ORDER — BENZONATATE 100 MG PO CAPS: 100.0000 mg | ORAL_CAPSULE | Freq: Three times a day (TID) | ORAL | 0 refills | Status: DC | PRN

## 2016-07-02 MED ORDER — AMOXICILLIN 875 MG PO TABS: 875.0000 mg | ORAL_TABLET | Freq: Two times a day (BID) | ORAL | 0 refills | Status: DC

## 2016-07-02 MED ORDER — BUDESONIDE-FORMOTEROL FUMARATE 160-4.5 MCG/ACT IN AERO: INHALATION_SPRAY | RESPIRATORY_TRACT | 0 refills | Status: DC

## 2016-07-02 NOTE — Progress Notes (Addendum)
Subjective:  By signing my name below, I, [name], attest that this documentation has been prepared under the direction and in the presence of [name], MD.  Electronically Signed: [name], ED Scribe. 07/02/2016. 6:14 PM.   Patient ID: [name], female    DOB: 1951/01/09, 65 y.o.   MRN: [id number]  HPI Chief Complaint  Patient presents with  . chest congestion    x 1 week     HPI Comments: [name] is a 65 y.o. female who presents to the [facility] complaining of chest congestion onset 1 week. Pt was recently hospitalized for cardioversion to treat afib on 11/10. Discharged same day. Seen by her cardiologist 5 days ago. Metoprolol was increased and was kept on xarelto. Scheduled for an echo and sleep study. Past hx of asthma, COPD, GERD, type II diabetes. She has nebulizer machine at home with xopenex prn. She is on Toprol with prn lopressor; Prilosec, accupril, Symbicort, 2 puff bid prn, xopenex inhaler in addition to neb and also has mucinex on medication list in addition to others.   Pt states symptoms started 1 week ago with nasal congestion. Symptoms progressed with productive cough consisting of green sputum and worsening nasal congestion with green discharge. She reports associates mild sinus pressure that began today. She has been using Symbicort sometimes at night when feeling congested. She has not used xopenex; states this causes palpitation.  She takes mucinex daily. She denies otalgia and new SOB.   Patient Active Problem List   Diagnosis Date Noted  . Persistent atrial fibrillation ([place])   . Dysphoric mood 01/04/2016  . Snoring 10/11/2015  . Obesity hypoventilation syndrome ([place]) 10/11/2015  . Paroxysmal atrial fibrillation ([place]) 10/11/2015  . Atherosclerosis of coronary artery of native heart 10/11/2015  . Atrial fibrillation with rapid ventricular response ([place]) 12/16/2013  . CAD (coronary artery disease), hx of LCX stent DES 2009,   last cath 2012 50-60% RCA stenosis, patent stent, 30 % LM. 04/28/2013  . Essential hypertension 05/06/2009  . LOW BACK PAIN, CHRONIC 02/12/2008  . Anemia 08/15/2007  . Type 2 diabetes mellitus with vascular disease ([place]) 01/02/2007  . Hyperlipidemia 01/02/2007  . ELEVATION, TRANSAMINASE/LDH LEVELS 01/02/2007   Past Medical History:  Diagnosis Date  . Anemia, unspecified   . Arthritis   . Asthma   . Atrial fibrillation with RVR ([place])    a. on Xarelto  . Bell's palsy    Facial nerve decompression in 2001  . Chronic low back pain   . COPD with asthma ([place])   . Coronary artery disease    Myoview 04/12/11 was entirely normal. ECHO 02/26/08 showed only minor abnormalities. Stenting 05/26/08 of her posterolateral branch to the left circumflex coronary artery. Used a 2.5x75mm Taxus Monorail stent.myoview 2014 was without ischemia  . Diabetes mellitus    Type 2  . GERD (gastroesophageal reflux disease)   . Glaucoma   . Goiter   . Heart murmur   . History of nuclear stress test 2012; 2014   lexiscan; normal pattern of perfusion; normal, low risk scan   . Hyperlipidemia   . Hypertension   . Pneumonia 2008  . Polycystic ovary disease    Hysterectomy in 1982 for this  . Shortness of breath dyspnea    ECHO 02/26/08 showed only minor abnormalities  . Spinal stenosis    Past Surgical History:  Procedure Laterality Date  . ABDOMINAL HYSTERECTOMY  1982   & BSO; for polycystic  ovary disease  . CARDIOVERSION N/A 12/17/2013   Procedure: CARDIOVERSION;  Surgeon: Pixie Casino, MD;  Location: [facility] ENDOSCOPY;  Service: Cardiovascular;  Laterality: N/A;  . CARDIOVERSION N/A 09/30/2015   Procedure: CARDIOVERSION;  Surgeon: Dorothy Spark, MD;  Location: [facility] ENDOSCOPY;  Service: Cardiovascular;  Laterality: N/A;  . CARDIOVERSION N/A 06/22/2016   Procedure: CARDIOVERSION;  Surgeon: Skeet Latch, MD;  Location: [place];  Service: Cardiovascular;  Laterality: N/A;  . COLONOSCOPY     last  2009; Dr Cristina Gong; due 2019  . CORONARY ANGIOPLASTY  05/26/2008   Stenting of her posterolateral branch to the left circumflex coronary artery. Used a 2.5x22mm Taxus Monorail stent.  [name] FACIAL NERVE DECOMPRESSION  2001/2002   bells palsy   . LAPAROSCOPIC CHOLECYSTECTOMY  06/15/2011    Dr [place] Batman  . TEE WITHOUT CARDIOVERSION N/A 12/17/2013   Procedure: TRANSESOPHAGEAL ECHOCARDIOGRAM (TEE);  Surgeon: Pixie Casino, MD;  Location: [facility] ENDOSCOPY;  Service: Cardiovascular;  Laterality: N/A;  trish/ja  . TRANSTHORACIC ECHOCARDIOGRAM  123456   LV systolic function normal with mild conc LVH; LA mildly dilated; trace MR/TR  . UPPER GI ENDOSCOPY  2009   negative   Allergies  Allergen Reactions  . Benadryl [Diphenhydramine Hcl] Other (See Comments)    Restless leg  . Clopidogrel Bisulfate Nausea Only    Nausea & pain  . Contrast Media [Iodinated Diagnostic Agents] Palpitations    Rapid heart rate, hot   Prior to Admission medications   Medication Sig Start Date End Date Taking? Authorizing Provider  ascorbic acid (VITAMIN C) 1000 MG tablet Take 1,000 mg by mouth 2 (two) times daily.    Yes Historical Provider, MD  CALCIUM-MAG-VIT C-VIT D PO Take 2 tablets by mouth daily.    Yes Historical Provider, MD  celecoxib (CELEBREX) 200 MG capsule Take 200 mg by mouth 2 (two) times daily.  12/30/13  Yes Historical Provider, MD  CRESTOR 10 MG tablet TAKE ONE TABLET AT BEDTIME. 02/02/16  Yes Lorretta Harp, MD  diltiazem (CARDIZEM CD) 180 MG 24 hr capsule TAKE 1 CAPSULE DAILY. 01/06/16  Yes Lorretta Harp, MD  estradiol (ESTRACE) 0.5 MG tablet Take 0.5 mg by mouth daily.  04/23/11  Yes Historical Provider, MD  gabapentin (NEURONTIN) 100 MG capsule Take 1 capsule (100 mg total) by mouth 3 (three) times daily. 01/02/16  Yes Hoyt Koch, MD  guaiFENesin (MUCINEX) 600 MG 12 hr tablet Take 600 mg by mouth 2 (two) times daily.   Yes Historical Provider, MD  INVOKANA 100 MG TABS tablet TAKE 1 TABLET ONCE  DAILY. 04/02/16  Yes Hoyt Koch, MD  isosorbide mononitrate (IMDUR) 30 MG 24 hr tablet TAKE 1 TABLET ONCE DAILY. 03/30/16  Yes Lorretta Harp, MD  latanoprost (XALATAN) 0.005 % ophthalmic solution Place 1 drop into both eyes at bedtime. 12/09/13  Yes Historical Provider, MD  levalbuterol Penne Lash) 0.63 MG/3ML nebulizer solution Take 3 mLs (0.63 mg total) by nebulization every 6 (six) hours as needed for wheezing or shortness of breath. 08/21/13  Yes Darlyne Russian, MD  LORazepam (ATIVAN) 0.5 MG tablet Take 1 mg by mouth at bedtime.  04/23/11  Yes Historical Provider, MD  Melatonin 5 MG TABS Take 1 tablet by mouth at bedtime.    Yes Historical Provider, MD  metoprolol succinate (TOPROL XL) 25 MG 24 hr tablet Take 1 tablet (25 mg total) by mouth at bedtime. 06/27/16  Yes Sherran Needs, NP  metoprolol succinate (TOPROL-XL) 50 MG 24 hr  tablet Take 3 tablets (150 mg total) by mouth daily. 04/19/16  Yes Lorretta Harp, MD  metoprolol tartrate (LOPRESSOR) 25 MG tablet TAKE 1 TABLET AS NEEDED FOR IRREGULAR HEART RATE. 03/09/16  Yes Lorretta Harp, MD  Multiple Vitamins-Iron (MULTIVITAMINS WITH IRON) TABS Take 1 tablet by mouth 2 (two) times daily.    Yes Historical Provider, MD  omega-3 acid ethyl esters (LOVAZA) 1 G capsule Take 1 g by mouth daily.    Yes Historical Provider, MD  omeprazole (PRILOSEC) 20 MG capsule Take 20 mg by mouth daily.    Yes Historical Provider, MD  [facility] DELICA LANCETS FINE MISC 1 Units by Does not apply route as directed. 10/05/11  Yes Midge Minium, MD  PARoxetine (PAXIL) 10 MG tablet Take 25 mg by mouth daily.    Yes Historical Provider, MD  quinapril (ACCUPRIL) 40 MG tablet Take 1 tablet (40 mg total) by mouth daily. 01/02/16  Yes Hoyt Koch, MD  senna (SENOKOT) 8.6 MG TABS Take 1 tablet by mouth daily.    Yes Historical Provider, MD  sitaGLIPtin-metformin (JANUMET) 50-1000 MG tablet 1/2 tablet in the morning and 1 tablet in the evening 01/02/16  Yes  Hoyt Koch, MD  SYMBICORT 160-4.5 MCG/ACT inhaler USE 2 PUFFS TWICE DAILY AS NEEDED FOR SHORTNESS OF BREATH. 03/23/16  Yes Hoyt Koch, MD  XARELTO 20 MG TABS tablet TAKE 1 TABLET EACH DAY. 05/18/16  Yes Lorretta Harp, MD  XOPENEX HFA 45 MCG/ACT inhaler USE 1 TO 2 PUFFS EVERY 6 HOURS AS NEEDED FOR WHEEZING. 03/23/16  Yes Hoyt Koch, MD   Social History   Social History  . Marital status: Married    Spouse name: N/A  . Number of children: 2  . 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 Smoking status: Former Smoker    Packs/day: 1.50    Years: 30.00    Types: Cigarettes    Quit date: 08/13/1993  . Smokeless tobacco: Never Used  . Alcohol use No  . Drug use: No  . Sexual activity: Not on file   Other Topics Concern  . Not on file   Social History Narrative  . No narrative on file   Review of Systems  Constitutional: Positive for activity change and fatigue. Negative for chills and fever.  HENT: Positive for congestion, ear pain, sinus pain and sinus pressure.   Respiratory: Positive for cough. Negative for shortness of breath.   Cardiovascular: Negative for chest pain, palpitations and leg swelling.  Hematological: Negative for adenopathy.  Psychiatric/Behavioral: Negative for sleep disturbance.    Objective:   Physical Exam  Constitutional: She is oriented to person, place, and time. She appears well-developed and well-nourished. No distress.  HENT:  Head: Normocephalic and atraumatic.  Right Ear: Tympanic membrane and ear canal normal.  Left Ear: Tympanic membrane and ear canal normal.  Nose: Nose normal.  Mouth/Throat: Uvula is midline, oropharynx is clear and moist and mucous membranes are normal.  Eyes: Conjunctivae and EOM are normal.  Neck: Neck supple.  Cardiovascular: Normal rate and normal heart sounds.  An irregularly irregular rhythm present.    No murmur heard. Pulmonary/Chest: Effort normal. She has wheezes ( expiratory). She has no rales.  Decreased air movement  Musculoskeletal: Normal range of motion.  Lymphadenopathy:    She has no cervical adenopathy.  Neurological: She is alert and oriented to  person, place, and time.  Skin: Skin is warm and dry.  Psychiatric: She has a normal mood and affect. Her behavior is normal.  Nursing note and vitals reviewed.  Vitals:   07/02/16 1751  BP: (!) 142/80  Pulse: 92  Resp: 16  Temp: 98.3 F (36.8 C)  TempSrc: Oral  SpO2: 98%  Weight: 197 lb (89.4 kg)  Height: 5\' 2"  (1.575 m)    Assessment & Plan:   1. Asthma with acute exacerbation, unspecified asthma severity, unspecified whether persistent     Meds ordered this encounter  Medications  . amoxicillin (AMOXIL) 875 MG tablet    Sig: Take 1 tablet (875 mg total) by mouth 2 (two) times daily.    Dispense:  20 tablet    Refill:  0  . benzonatate (TESSALON) 100 MG capsule    Sig: Take 1-2 capsules (100-200 mg total) by mouth 3 (three) times daily as needed for cough.    Dispense:  60 capsule    Refill:  0  . levalbuterol (XOPENEX) 0.63 MG/3ML nebulizer solution    Sig: Take 3 mLs (0.63 mg total) by nebulization every 6 (six) hours as needed for wheezing or shortness of breath.    Dispense:  75 mL    Refill:  1  . budesonide-formoterol (SYMBICORT) 160-4.5 MCG/ACT inhaler    Sig: USE 2 PUFFS TWICE DAILY    Dispense:  10.2 g    Refill:  0    I personally performed the services described in this documentation, which was scribed in my presence. The recorded information has been reviewed and considered, and addended by me as needed.   [name], M.D.  [facility] [address] [place], [place] [address] [phone] phone [phone] fax  07/11/16 8:58 AM

## 2016-07-02 NOTE — Patient Instructions (Signed)
Meds ordered this encounter  Medications  . amoxicillin (AMOXIL) 875 MG tablet    Sig: Take 1 tablet (875 mg total) by mouth 2 (two) times daily.    Dispense:  20 tablet    Refill:  0  . benzonatate (TESSALON) 100 MG capsule    Sig: Take 1-2 capsules (100-200 mg total) by mouth 3 (three) times daily as needed for cough.    Dispense:  60 capsule    Refill:  0  . levalbuterol (XOPENEX) 0.63 MG/3ML nebulizer solution    Sig: Take 3 mLs (0.63 mg total) by nebulization every 6 (six) hours as needed for wheezing or shortness of breath.    Dispense:  75 mL    Refill:  1  . budesonide-formoterol (SYMBICORT) 160-4.5 MCG/ACT inhaler    Sig: USE 2 PUFFS TWICE DAILY    Dispense:  10.2 g    Refill:  0    Asthma, Acute Bronchospasm Acute bronchospasm caused by asthma is also referred to as an asthma attack. Bronchospasm means your air passages become narrowed. The narrowing is caused by inflammation and tightening of the muscles in the air tubes (bronchi) in your lungs. This can make it hard to breathe or cause you to wheeze and cough. What are the causes? Possible triggers are:  Animal dander from the skin, hair, or feathers of animals.  Dust mites contained in house dust.  Cockroaches.  Pollen from trees or grass.  Mold.  Cigarette or tobacco smoke.  Air pollutants such as dust, household cleaners, hair sprays, aerosol sprays, paint fumes, strong chemicals, or strong odors.  Cold air or weather changes. Cold air may trigger inflammation. Winds increase molds and pollens in the air.  Strong emotions such as crying or laughing hard.  Stress.  Certain medicines such as aspirin or beta-blockers.  Sulfites in foods and drinks, such as dried fruits and wine.  Infections or inflammatory conditions, such as a flu, cold, or inflammation of the nasal membranes (rhinitis).  Gastroesophageal reflux disease (GERD). GERD is a condition where stomach acid backs up into your  esophagus.  Exercise or strenuous activity. What are the signs or symptoms?  Wheezing.  Excessive coughing, particularly at night.  Chest tightness.  Shortness of breath. How is this diagnosed? Your health care provider will ask you about your medical history and perform a physical exam. A chest X-ray or blood testing may be performed to look for other causes of your symptoms or other conditions that may have triggered your asthma attack. How is this treated? Treatment is aimed at reducing inflammation and opening up the airways in your lungs. Most asthma attacks are treated with inhaled medicines. These include quick relief or rescue medicines (such as bronchodilators) and controller medicines (such as inhaled corticosteroids). These medicines are sometimes given through an inhaler or a nebulizer. Systemic steroid medicine taken by mouth or given through an IV tube also can be used to reduce the inflammation when an attack is moderate or severe. Antibiotic medicines are only used if a bacterial infection is present. Follow these instructions at home:  Rest.  Drink plenty of liquids. This helps the mucus to remain thin and be easily coughed up. Only use caffeine in moderation and do not use alcohol until you have recovered from your illness.  Do not smoke. Avoid being exposed to secondhand smoke.  You play a critical role in keeping yourself in good health. Avoid exposure to things that cause you to wheeze or to have breathing  problems.  Keep your medicines up-to-date and available. Carefully follow your health care provider's treatment plan.  Take your medicine exactly as prescribed.  When pollen or pollution is bad, keep windows closed and use an air conditioner or go to places with air conditioning.  Asthma requires careful medical care. See your health care provider for a follow-up as advised. If you are more than [redacted] weeks pregnant and you were prescribed any new medicines, let  your obstetrician know about the visit and how you are doing. Follow up with your health care provider as directed.  After you have recovered from your asthma attack, make an appointment with your outpatient doctor to talk about ways to reduce the likelihood of future attacks. If you do not have a doctor who manages your asthma, make an appointment with a primary care doctor to discuss your asthma. Get help right away if:  You are getting worse.  You have trouble breathing. If severe, call your local emergency services (911 in the [place]  You develop chest pain or discomfort.  You are vomiting.  You are not able to keep fluids down.  You are coughing up yellow, green, brown, or bloody sputum.  You have a fever and your symptoms suddenly get worse.  You have trouble swallowing. This information is not intended to replace advice given to you by your health care provider. Make sure you discuss any questions you have with your health care provider. Document Released: 11/14/2006 Document Revised: 01/11/2016 Document Reviewed: 02/04/2013 Elsevier Interactive Patient Education  2017 [employer].

## 2016-07-07 ENCOUNTER — Ambulatory Visit (INDEPENDENT_AMBULATORY_CARE_PROVIDER_SITE_OTHER): Payer: Medicare Other | Admitting: Family Medicine

## 2016-07-07 ENCOUNTER — Ambulatory Visit (INDEPENDENT_AMBULATORY_CARE_PROVIDER_SITE_OTHER): Payer: Medicare Other

## 2016-07-07 VITALS — BP 112/70 | HR 100 | Temp 98.6°F | Resp 16 | Ht 62.0 in | Wt 197.0 lb

## 2016-07-07 DIAGNOSIS — S92352A Displaced fracture of fifth metatarsal bone, left foot, initial encounter for closed fracture: Secondary | ICD-10-CM

## 2016-07-07 DIAGNOSIS — M25572 Pain in left ankle and joints of left foot: Secondary | ICD-10-CM

## 2016-07-07 DIAGNOSIS — M79672 Pain in left foot: Secondary | ICD-10-CM | POA: Diagnosis not present

## 2016-07-07 NOTE — Patient Instructions (Addendum)
  Unfortunately you do have a fracture of your fifth metatarsal or fifth foot bone. Based on the area that is involved and slight displacement, I'm concerned about a possible Jones fracture or a fracture in area that does not heal well at times. For that reason I did place you in the crutches and splint to be nonweightbearing until you're seen by orthopedics. I did place a referral to Dr. Lynann Bologna.   Tylenol   IF you received an x-ray today, you will receive an invoice from [facility]. Please contact [facility] at [phone] with questions or concerns regarding your invoice.   IF you received labwork today, you will receive an invoice from [employer]. Please contact [facility] at [phone] with questions or concerns regarding your invoice.   Our billing staff will not be able to assist you with questions regarding bills from these companies.  You will be contacted with the lab results as soon as they are available. The fastest way to get your results is to activate your My Chart account. Instructions are located on the last page of this paperwork. If you have not heard from [place] regarding the results in 2 weeks, please contact this office.

## 2016-07-07 NOTE — Progress Notes (Signed)
Posterior lower leg splint applied per Dr. Vonna Kotyk vo. Philis Fendt, MS, PA-C 4:40 PM, 07/07/2016

## 2016-07-07 NOTE — Progress Notes (Signed)
Subjective:  By signing my name below, I, Moises Blood, attest that this documentation has been prepared under the direction and in the presence of Merri Ray, MD. Electronically Signed: Moises Blood, [place]. 07/07/2016 , 3:57 PM .  Patient was seen in Room 12 .   Patient ID: [name], female    DOB: 05-15-1951, 65 y.o.   MRN: [id number] Chief Complaint  Patient presents with  . Foot Injury    Injuried left foot on Tuesday    HPI [name] is a 65 y.o. female  Patient injured her foot about 4 nights ago. She was stepping off of the curb at night and stepped onto a brick, turning her left foot in. She's had left ankle sprains in the past but not recently. She also noticed bruising in her toes and bottom of her foot the next day. She is on blood thinners.   She has an ankle brace, worn occasionally. She iced it immediately, elevated her left foot, and taken tylenol a few times.   She was visiting cousins in [place], [place].   Patient Active Problem List   Diagnosis Date Noted  . Persistent atrial fibrillation ([place])   . Dysphoric mood 01/04/2016  . Snoring 10/11/2015  . Obesity hypoventilation syndrome ([place]) 10/11/2015  . Paroxysmal atrial fibrillation ([place]) 10/11/2015  . Atherosclerosis of coronary artery of native heart 10/11/2015  . Atrial fibrillation with rapid ventricular response ([place]) 12/16/2013  . CAD (coronary artery disease), hx of LCX stent DES 2009,  last cath 2012 50-60% RCA stenosis, patent stent, 30 % LM. 04/28/2013  . Essential hypertension 05/06/2009  . LOW BACK PAIN, CHRONIC 02/12/2008  . Anemia 08/15/2007  . Type 2 diabetes mellitus with vascular disease ([place]) 01/02/2007  . Hyperlipidemia 01/02/2007  . ELEVATION, TRANSAMINASE/LDH LEVELS 01/02/2007   Past Medical History:  Diagnosis Date  . Anemia, unspecified   . Arthritis   . Asthma   . Atrial fibrillation with RVR ([place])    a. on Xarelto  . Bell's palsy    Facial nerve decompression  in 2001  . Chronic low back pain   . COPD with asthma ([place])   . Coronary artery disease    Myoview 04/12/11 was entirely normal. ECHO 02/26/08 showed only minor abnormalities. Stenting 05/26/08 of her posterolateral branch to the left circumflex coronary artery. Used a 2.5x30mm Taxus Monorail stent.myoview 2014 was without ischemia  . Diabetes mellitus    Type 2  . GERD (gastroesophageal reflux disease)   . Glaucoma   . Goiter   . Heart murmur   . History of nuclear stress test 2012; 2014   lexiscan; normal pattern of perfusion; normal, low risk scan   . Hyperlipidemia   . Hypertension   . Pneumonia 2008  . Polycystic ovary disease    Hysterectomy in 1982 for this  . Shortness of breath dyspnea    ECHO 02/26/08 showed only minor abnormalities  . Spinal stenosis    Past Surgical History:  Procedure Laterality Date  . ABDOMINAL HYSTERECTOMY  1982   & BSO; for polycystic ovary disease  . CARDIOVERSION N/A 12/17/2013   Procedure: CARDIOVERSION;  Surgeon: Pixie Casino, MD;  Location: [facility] ENDOSCOPY;  Service: Cardiovascular;  Laterality: N/A;  . CARDIOVERSION N/A 09/30/2015   Procedure: CARDIOVERSION;  Surgeon: Dorothy Spark, MD;  Location: [facility] ENDOSCOPY;  Service: Cardiovascular;  Laterality: N/A;  . CARDIOVERSION N/A 06/22/2016   Procedure: CARDIOVERSION;  Surgeon: Skeet Latch, MD;  Location: [place];  Service:  Cardiovascular;  Laterality: N/A;  . COLONOSCOPY     last 2009; Dr Cristina Gong; due 2019  . CORONARY ANGIOPLASTY  05/26/2008   Stenting of her posterolateral branch to the left circumflex coronary artery. Used a 2.5x55mm Taxus Monorail stent.  [name] FACIAL NERVE DECOMPRESSION  2001/2002   bells palsy   . LAPAROSCOPIC CHOLECYSTECTOMY  06/15/2011    Dr Dalbert Batman  . TEE WITHOUT CARDIOVERSION N/A 12/17/2013   Procedure: TRANSESOPHAGEAL ECHOCARDIOGRAM (TEE);  Surgeon: Pixie Casino, MD;  Location: [facility] ENDOSCOPY;  Service: Cardiovascular;  Laterality: N/A;  trish/ja  .  TRANSTHORACIC ECHOCARDIOGRAM  123456   LV systolic function normal with mild conc LVH; LA mildly dilated; trace MR/TR  . UPPER GI ENDOSCOPY  2009   negative   Allergies  Allergen Reactions  . Benadryl [Diphenhydramine Hcl] Other (See Comments)    Restless leg  . Clopidogrel Bisulfate Nausea Only    Nausea & pain  . Contrast Media [Iodinated Diagnostic Agents] Palpitations    Rapid heart rate, hot   Prior to Admission medications   Medication Sig Start Date End Date Taking? Authorizing Provider  amoxicillin (AMOXIL) 875 MG tablet Take 1 tablet (875 mg total) by mouth 2 (two) times daily. 07/02/16   Shawnee Knapp, MD  ascorbic acid (VITAMIN C) 1000 MG tablet Take 1,000 mg by mouth 2 (two) times daily.     Historical Provider, MD  benzonatate (TESSALON) 100 MG capsule Take 1-2 capsules (100-200 mg total) by mouth 3 (three) times daily as needed for cough. 07/02/16   Shawnee Knapp, MD  budesonide-formoterol [facility]) 160-4.5 MCG/ACT inhaler USE 2 PUFFS TWICE DAILY 07/02/16   Shawnee Knapp, MD  CALCIUM-MAG-VIT C-VIT D PO Take 2 tablets by mouth daily.     Historical Provider, MD  celecoxib (CELEBREX) 200 MG capsule Take 200 mg by mouth 2 (two) times daily.  12/30/13   Historical Provider, MD  CRESTOR 10 MG tablet TAKE ONE TABLET AT BEDTIME. 02/02/16   Lorretta Harp, MD  diltiazem (CARDIZEM CD) 180 MG 24 hr capsule TAKE 1 CAPSULE DAILY. 01/06/16   Lorretta Harp, MD  estradiol (ESTRACE) 0.5 MG tablet Take 0.5 mg by mouth daily.  04/23/11   Historical Provider, MD  gabapentin (NEURONTIN) 100 MG capsule Take 1 capsule (100 mg total) by mouth 3 (three) times daily. 01/02/16   Hoyt Koch, MD  guaiFENesin (MUCINEX) 600 MG 12 hr tablet Take 600 mg by mouth 2 (two) times daily.    Historical Provider, MD  INVOKANA 100 MG TABS tablet TAKE 1 TABLET ONCE DAILY. 04/02/16   Hoyt Koch, MD  isosorbide mononitrate (IMDUR) 30 MG 24 hr tablet TAKE 1 TABLET ONCE DAILY. 03/30/16   Lorretta Harp,  MD  latanoprost (XALATAN) 0.005 % ophthalmic solution Place 1 drop into both eyes at bedtime. 12/09/13   Historical Provider, MD  levalbuterol Penne Lash) 0.63 MG/3ML nebulizer solution Take 3 mLs (0.63 mg total) by nebulization every 6 (six) hours as needed for wheezing or shortness of breath. 07/02/16   Shawnee Knapp, MD  LORazepam (ATIVAN) 0.5 MG tablet Take 1 mg by mouth at bedtime.  04/23/11   Historical Provider, MD  Melatonin 5 MG TABS Take 1 tablet by mouth at bedtime.     Historical Provider, MD  metoprolol succinate (TOPROL XL) 25 MG 24 hr tablet Take 1 tablet (25 mg total) by mouth at bedtime. 06/27/16   Sherran Needs, NP  metoprolol succinate (TOPROL-XL) 50 MG 24 hr  tablet Take 3 tablets (150 mg total) by mouth daily. 04/19/16   Lorretta Harp, MD  metoprolol tartrate (LOPRESSOR) 25 MG tablet TAKE 1 TABLET AS NEEDED FOR IRREGULAR HEART RATE. 03/09/16   Lorretta Harp, MD  Multiple Vitamins-Iron (MULTIVITAMINS WITH IRON) TABS Take 1 tablet by mouth 2 (two) times daily.     Historical Provider, MD  omega-3 acid ethyl esters (LOVAZA) 1 G capsule Take 1 g by mouth daily.     Historical Provider, MD  omeprazole (PRILOSEC) 20 MG capsule Take 20 mg by mouth daily.     Historical Provider, MD  [facility] DELICA LANCETS FINE MISC 1 Units by Does not apply route as directed. 10/05/11   Midge Minium, MD  PARoxetine (PAXIL) 10 MG tablet Take 25 mg by mouth daily.     Historical Provider, MD  quinapril (ACCUPRIL) 40 MG tablet Take 1 tablet (40 mg total) by mouth daily. 01/02/16   Hoyt Koch, MD  senna (SENOKOT) 8.6 MG TABS Take 1 tablet by mouth daily.     Historical Provider, MD  sitaGLIPtin-metformin (JANUMET) 50-1000 MG tablet 1/2 tablet in the morning and 1 tablet in the evening 01/02/16   Hoyt Koch, MD  XARELTO 20 MG TABS tablet TAKE 1 TABLET EACH DAY. 05/18/16   Lorretta Harp, MD  XOPENEX HFA 45 MCG/ACT inhaler USE 1 TO 2 PUFFS EVERY 6 HOURS AS NEEDED FOR WHEEZING. 03/23/16    Hoyt Koch, MD   Social History   Social History  . Marital status: Married    Spouse name: N/A  . Number of children: 2  . Years of education: master's   Occupational History  . 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 Smoking status: Former Smoker    Packs/day: 1.50    Years: 30.00    Types: Cigarettes    Quit date: 08/13/1993  . Smokeless tobacco: Never Used  . Alcohol use No  . Drug use: No  . Sexual activity: Not on file   Other Topics Concern  . Not on file   Social History Narrative  . No narrative on file   Review of Systems  Constitutional: Negative for chills, fatigue, fever and unexpected weight change.  Respiratory: Negative for cough.   Cardiovascular: Negative for leg swelling.  Gastrointestinal: Negative for constipation, diarrhea, nausea and vomiting.  Musculoskeletal: Positive for arthralgias, gait problem and myalgias. Negative for joint swelling.  Skin: Negative for rash and wound.  Neurological: Negative for dizziness, weakness, numbness and headaches.       Objective:   Physical Exam  Constitutional: She is oriented to person, place, and time. She appears well-developed and well-nourished. No distress.  HENT:  Head: Normocephalic and atraumatic.  Eyes: EOM are normal. Pupils are equal, round, and reactive to light.  Neck: Neck supple.  Cardiovascular: Normal rate.   Pulmonary/Chest: Effort normal. No respiratory distress.  Musculoskeletal: Normal range of motion.  Proximal fibula on the left is non tender, tidfib non tender, medial malleolus non tender; some soft tissue swelling with tenderness to palpation at anterior lateral ankle and lateral malleolus anteriorly; achilles non tender; some faint ecchymosis at the lateral ankle, navicula non tender 1st MT and toe non tender, but distal metatarsals 4th and 5th MT's are most tender, diffusely tender along dorsal lateral mid foot; only minimal tenderness at  the proximal 5th; some ecchymosis on the plantar mid foot, as well as 2nd through 5th toes  with faint ecchymosis of dorsal foot Left foot rom intact but pain with eversion  Neurological: She is alert and oriented to person, place, and time.  Skin: Skin is warm and dry.  Psychiatric: She has a normal mood and affect. Her behavior is normal.  Nursing note and vitals reviewed.   Vitals:   07/07/16 1532  BP: 112/70  Pulse: 100  Resp: 16  Temp: 98.6 F (37 C)  TempSrc: Oral  SpO2: 95%  Weight: 197 lb (89.4 kg)  Height: 5\' 2"  (1.575 m)   Dg Ankle Complete Left  Result Date: 07/07/2016 CLINICAL DATA:  Left foot and ankle pain after injury 4 days prior EXAM: LEFT ANKLE COMPLETE - 3+ VIEW COMPARISON:  None. FINDINGS: Intra-articular nondisplaced left base of fifth metatarsal fracture. No additional left ankle fracture or subluxation. No suspicious focal osseous lesion. Mild diffuse left ankle soft tissue swelling. Small plantar and tiny Achilles left calcaneal spurs. No radiopaque foreign body. IMPRESSION: Intra-articular nondisplaced left base of fifth metatarsal fracture. No fracture or subluxation in the left ankle. Electronically Signed   By: Ilona Sorrel M.D.   On: 07/07/2016 16:17   Dg Foot Complete Left  Result Date: 07/07/2016 CLINICAL DATA:  Lateral left foot pain after injury 4 days prior EXAM: LEFT FOOT - COMPLETE 3+ VIEW COMPARISON:  None. FINDINGS: Nondisplaced intra-articular left base of fifth metatarsal fracture. No additional fracture. No suspicious focal osseous lesion. Mild degenerative changes in the tarsometatarsal joints. Small plantar and tiny Achilles left calcaneal spurs. No radiopaque foreign body. IMPRESSION: Nondisplaced intra-articular left base of fifth metatarsal fracture. Electronically Signed   By: Ilona Sorrel M.D.   On: 07/07/2016 16:18      Assessment & Plan:   [name] is a 65 y.o. female Acute left ankle pain - Plan: DG Ankle Complete Left, AMB  referral to orthopedics, Crutches  Left foot pain - Plan: DG Foot Complete Left, AMB referral to orthopedics, Crutches  Closed displaced fracture of fifth metatarsal bone of left foot, initial encounter - Plan: AMB referral to orthopedics, Crutches  Fifth metatarsal base fracture. Concerning for Jones fracture based on location with possible slight displacement. Not read as displaced on x-ray report. Based on location, placed in posterior splint, nonweightbearing with crutches. She has a walking boot at home, so advised to bring that with her to orthopedic visit, but nonweightbearing for now. Refer to ortho within the next 1 week. Tylenol for pain control, let me know if other medications needed. rtc precautions.    No orders of the defined types were placed in this encounter.  Patient Instructions    Unfortunately you do have a fracture of your fifth metatarsal or fifth foot bone. Based on the area that is involved and slight displacement, I'm concerned about a possible Jones fracture or a fracture in area that does not heal well at times. For that reason I did place you in the crutches and splint to be nonweightbearing until you're seen by orthopedics. I did place a referral to Dr. Lynann Bologna.   Tylenol   IF you received an x-ray today, you will receive an invoice from [facility] Radiology. Please contact [facility] Radiology at [phone] with questions or concerns regarding your invoice.   IF you received labwork today, you will receive an invoice from [employer]. Please contact [facility] at [phone] with questions or concerns regarding your invoice.   Our billing staff will not be able to assist you with questions regarding bills from  these companies.  You will be contacted with the lab results as soon as they are available. The fastest way to get your results is to activate your My Chart account. Instructions are located on the last page of this paperwork. If  you have not heard from [place] regarding the results in 2 weeks, please contact this office.        I personally performed the services described in this documentation, which was scribed in my presence. The recorded information has been reviewed and considered, and addended by me as needed.   Signed,   Merri Ray, MD [facility].  07/07/16 5:21 PM

## 2016-07-09 ENCOUNTER — Telehealth: Payer: Self-pay

## 2016-07-09 NOTE — Telephone Encounter (Signed)
Pt is needing to get a copy of xray of left foot from Saturday   Pt wife states she is on the way

## 2016-07-12 NOTE — Telephone Encounter (Signed)
Please f/u

## 2016-07-12 NOTE — Telephone Encounter (Signed)
Left VM for pt 

## 2016-07-13 ENCOUNTER — Ambulatory Visit (HOSPITAL_COMMUNITY)
Admission: RE | Admit: 2016-07-13 | Discharge: 2016-07-13 | Disposition: A | Discharge status: Home / Self Care | Payer: Medicare Other | Source: Ambulatory Visit | Attending: Nurse Practitioner | Admitting: Nurse Practitioner

## 2016-07-13 DIAGNOSIS — I358 Other nonrheumatic aortic valve disorders: Secondary | ICD-10-CM | POA: Diagnosis not present

## 2016-07-13 DIAGNOSIS — I480 Paroxysmal atrial fibrillation: Secondary | ICD-10-CM | POA: Diagnosis not present

## 2016-07-13 DIAGNOSIS — I340 Nonrheumatic mitral (valve) insufficiency: Secondary | ICD-10-CM | POA: Diagnosis not present

## 2016-07-13 DIAGNOSIS — I4891 Unspecified atrial fibrillation: Secondary | ICD-10-CM | POA: Diagnosis not present

## 2016-07-13 NOTE — Progress Notes (Signed)
  Echocardiogram 2D Echocardiogram has been performed.  [name] 07/13/2016, 4:01 PM

## 2016-07-18 ENCOUNTER — Ambulatory Visit (HOSPITAL_COMMUNITY)
Admission: RE | Admit: 2016-07-18 | Discharge: 2016-07-18 | Disposition: A | Discharge status: Home / Self Care | Payer: Medicare Other | Source: Ambulatory Visit | Attending: Nurse Practitioner | Admitting: Nurse Practitioner

## 2016-07-18 VITALS — BP 144/88 | HR 95 | Ht 62.0 in | Wt 197.0 lb

## 2016-07-18 DIAGNOSIS — H409 Unspecified glaucoma: Secondary | ICD-10-CM | POA: Insufficient documentation

## 2016-07-18 DIAGNOSIS — Z836 Family history of other diseases of the respiratory system: Secondary | ICD-10-CM | POA: Insufficient documentation

## 2016-07-18 DIAGNOSIS — K219 Gastro-esophageal reflux disease without esophagitis: Secondary | ICD-10-CM | POA: Diagnosis not present

## 2016-07-18 DIAGNOSIS — M4800 Spinal stenosis, site unspecified: Secondary | ICD-10-CM | POA: Diagnosis not present

## 2016-07-18 DIAGNOSIS — E119 Type 2 diabetes mellitus without complications: Secondary | ICD-10-CM | POA: Insufficient documentation

## 2016-07-18 DIAGNOSIS — R0683 Snoring: Secondary | ICD-10-CM | POA: Insufficient documentation

## 2016-07-18 DIAGNOSIS — Z800 Family history of malignant neoplasm of digestive organs: Secondary | ICD-10-CM | POA: Insufficient documentation

## 2016-07-18 DIAGNOSIS — Z90710 Acquired absence of both cervix and uterus: Secondary | ICD-10-CM | POA: Diagnosis not present

## 2016-07-18 DIAGNOSIS — Z90722 Acquired absence of ovaries, bilateral: Secondary | ICD-10-CM | POA: Insufficient documentation

## 2016-07-18 DIAGNOSIS — Z7901 Long term (current) use of anticoagulants: Secondary | ICD-10-CM | POA: Diagnosis not present

## 2016-07-18 DIAGNOSIS — G8929 Other chronic pain: Secondary | ICD-10-CM | POA: Diagnosis not present

## 2016-07-18 DIAGNOSIS — I4819 Other persistent atrial fibrillation: Secondary | ICD-10-CM

## 2016-07-18 DIAGNOSIS — Z87891 Personal history of nicotine dependence: Secondary | ICD-10-CM | POA: Diagnosis not present

## 2016-07-18 DIAGNOSIS — G510 Bell's palsy: Secondary | ICD-10-CM | POA: Diagnosis not present

## 2016-07-18 DIAGNOSIS — Z808 Family history of malignant neoplasm of other organs or systems: Secondary | ICD-10-CM | POA: Insufficient documentation

## 2016-07-18 DIAGNOSIS — Z888 Allergy status to other drugs, medicaments and biological substances status: Secondary | ICD-10-CM | POA: Diagnosis not present

## 2016-07-18 DIAGNOSIS — Z8249 Family history of ischemic heart disease and other diseases of the circulatory system: Secondary | ICD-10-CM | POA: Diagnosis not present

## 2016-07-18 DIAGNOSIS — D649 Anemia, unspecified: Secondary | ICD-10-CM | POA: Insufficient documentation

## 2016-07-18 DIAGNOSIS — Z8371 Family history of colonic polyps: Secondary | ICD-10-CM | POA: Insufficient documentation

## 2016-07-18 DIAGNOSIS — Z7984 Long term (current) use of oral hypoglycemic drugs: Secondary | ICD-10-CM | POA: Diagnosis not present

## 2016-07-18 DIAGNOSIS — Z91041 Radiographic dye allergy status: Secondary | ICD-10-CM | POA: Insufficient documentation

## 2016-07-18 DIAGNOSIS — I10 Essential (primary) hypertension: Secondary | ICD-10-CM | POA: Insufficient documentation

## 2016-07-18 DIAGNOSIS — I481 Persistent atrial fibrillation: Secondary | ICD-10-CM | POA: Diagnosis not present

## 2016-07-18 DIAGNOSIS — Z955 Presence of coronary angioplasty implant and graft: Secondary | ICD-10-CM | POA: Diagnosis not present

## 2016-07-18 DIAGNOSIS — J449 Chronic obstructive pulmonary disease, unspecified: Secondary | ICD-10-CM | POA: Diagnosis not present

## 2016-07-18 DIAGNOSIS — E785 Hyperlipidemia, unspecified: Secondary | ICD-10-CM | POA: Diagnosis not present

## 2016-07-18 DIAGNOSIS — I2510 Atherosclerotic heart disease of native coronary artery without angina pectoris: Secondary | ICD-10-CM | POA: Diagnosis not present

## 2016-07-18 NOTE — Progress Notes (Signed)
Patient ID: ROSS SZABO, female   DOB: 28-Oct-1950, 65 y.o.   MRN: [id number]     Primary Care Physician: Hoyt Koch, MD Referring Physician: Dr. Ronald Lobo [name] is a 65 y.o. female with a h/o CAD, DM, HTN, obesity, PAF first diagnosed May of 2015 with successful DCCV x2, first in May 2015.Marland KitchenShe recently had an AF episode and metoprolol succinate was increased to 150 mg qd. She converted on this dose and has been maintaining SR.  No previous AAD. Chads vasc score of at least 5, on xarelto daily.   On last visit in the afib clinic, we discussed lifestyle triggers. Pt does not  smoke or drink alcohol.Her BP/Hgba1c is controlled. She is sedentary. She does snore, no previous sleep study. Sleep study is pending. We also discussed antiarrythmic, which she was not interested in at that time.  She saw Dr. Gwenlyn Found and was in afib. Amlodipine was stopped and cardizem was started. She converted within the day. Discussed AAD therapy  again, but she is still not interested since she is back in rhythm and would prefer to take a wait and see approach.  She is in the afib clinic 09/26/15 with c/o being in afib x 2 weeks. Symptomatic with fatigue and dyspnea with exertion. We discussed options to restore SR. She is not interested in antiarrythmic's at this time, would like a cardioversion. Last one held her in rhythm since 5/16. If she has ERAF, she will consider AAD.  She returns today in SR but is surprised her energy is not much better in SR. She has a mild cough, but mostly fatigue. She has a sleep study, pending. She had a stress test last summer which was low risk. Has noticed some increase in BP.  Pt is being seen again after some months with c/o afib for several weeks. She  has fatigue with afib. Again, options were discussed and she is still very resultant to start antiarrythmic's. She would not be a candidate for  1C agents. She is not interested in being hospitalized for a med and is  concerned re cost of multaq/tiksoyn. Side effects of amiodarone make her hesitatnt to use this drug.  F/u in afib clinic s/p cardioversion one week ago.She unfortunately has had ERAF. She felt great yesterday but was unaware that she was in afib today. Rate controlled but with v rate of upper 90's and could benefit from more rate control. Again, rate control was discussed and she is not candidate for 1c agents, does not want to stop Paxil to allow for tikosyn to be used. She is pending echo 12/1 and a sleep study. Multaq may be an option in conjunction with another cardioversion at a later date.   F/u in afib clinic 12/6. She continues to be symptomatic with afib. She visited her family in [place] over Thanksgiving,and fell and fx her foot which is in a boot. We discussed antiarrythmic's as well and she still can not decide on a good option. She is not willing to come off antidepressant which would be an issue with qtc prolonging drugs such as multaq, sotalol, tikosyn. She had to delay her sleep study until January due to foot fx. Echo updated and stable.  Today, she denies symptoms of palpitations, chest pain, shortness of breath, orthopnea, PND, lower extremity edema, dizziness, presyncope, syncope, or neurologic sequela. The patient is tolerating medications without difficulties and is otherwise without complaint today.   Past Medical History:  Diagnosis Date  .  Anemia, unspecified   . Arthritis   . Asthma   . Atrial fibrillation with RVR ([place])    a. on Xarelto  . Bell's palsy    Facial nerve decompression in 2001  . Chronic low back pain   . COPD with asthma ([place])   . Coronary artery disease    Myoview 04/12/11 was entirely normal. ECHO 02/26/08 showed only minor abnormalities. Stenting 05/26/08 of her posterolateral branch to the left circumflex coronary artery. Used a 2.5x19mm Taxus Monorail stent.myoview 2014 was without ischemia  . Diabetes mellitus    Type 2  . GERD (gastroesophageal reflux  disease)   . Glaucoma   . Goiter   . Heart murmur   . History of nuclear stress test 2012; 2014   lexiscan; normal pattern of perfusion; normal, low risk scan   . Hyperlipidemia   . Hypertension   . Pneumonia 2008  . Polycystic ovary disease    Hysterectomy in 1982 for this  . Shortness of breath dyspnea    ECHO 02/26/08 showed only minor abnormalities  . Spinal stenosis    Past Surgical History:  Procedure Laterality Date  . ABDOMINAL HYSTERECTOMY  1982   & BSO; for polycystic ovary disease  . CARDIOVERSION N/A 12/17/2013   Procedure: CARDIOVERSION;  Surgeon: Pixie Casino, MD;  Location: [facility] ENDOSCOPY;  Service: Cardiovascular;  Laterality: N/A;  . CARDIOVERSION N/A 09/30/2015   Procedure: CARDIOVERSION;  Surgeon: Dorothy Spark, MD;  Location: [facility] ENDOSCOPY;  Service: Cardiovascular;  Laterality: N/A;  . CARDIOVERSION N/A 06/22/2016   Procedure: CARDIOVERSION;  Surgeon: Skeet Latch, MD;  Location: [facility];  Service: Cardiovascular;  Laterality: N/A;  . COLONOSCOPY     last 2009; Dr Cristina Gong; due 2019  . CORONARY ANGIOPLASTY  05/26/2008   Stenting of her posterolateral branch to the left circumflex coronary artery. Used a 2.5x80mm Taxus Monorail stent.  [name] FACIAL NERVE DECOMPRESSION  2001/2002   bells palsy   . LAPAROSCOPIC CHOLECYSTECTOMY  06/15/2011    Dr Dalbert Batman  . TEE WITHOUT CARDIOVERSION N/A 12/17/2013   Procedure: TRANSESOPHAGEAL ECHOCARDIOGRAM (TEE);  Surgeon: Pixie Casino, MD;  Location: [facility] ENDOSCOPY;  Service: Cardiovascular;  Laterality: N/A;  trish/ja  . TRANSTHORACIC ECHOCARDIOGRAM  123456   LV systolic function normal with mild conc LVH; LA mildly dilated; trace MR/TR  . UPPER GI ENDOSCOPY  2009   negative    Current Outpatient Prescriptions  Medication Sig Dispense Refill  . ascorbic acid (VITAMIN C) 1000 MG tablet Take 1,000 mg by mouth 2 (two) times daily.     . benzonatate (TESSALON) 100 MG capsule Take 1-2 capsules (100-200 mg total) by  mouth 3 (three) times daily as needed for cough. 60 capsule 0  . budesonide-formoterol (SYMBICORT) 160-4.5 MCG/ACT inhaler USE 2 PUFFS TWICE DAILY 10.2 g 0  . CALCIUM-MAG-VIT C-VIT D PO Take 2 tablets by mouth daily.     . celecoxib (CELEBREX) 200 MG capsule Take 200 mg by mouth 2 (two) times daily.     . CRESTOR 10 MG tablet TAKE ONE TABLET AT BEDTIME. 30 tablet 7  . diltiazem (CARDIZEM CD) 180 MG 24 hr capsule TAKE 1 CAPSULE DAILY. 30 capsule 11  . estradiol (ESTRACE) 0.5 MG tablet Take 0.5 mg by mouth daily.     [name] gabapentin (NEURONTIN) 100 MG capsule Take 1 capsule (100 mg total) by mouth 3 (three) times daily. 180 capsule 3  . guaiFENesin (MUCINEX) 600 MG 12 hr tablet Take 600 mg by mouth 2 (two)  times daily.    . INVOKANA 100 MG TABS tablet TAKE 1 TABLET ONCE DAILY. 90 tablet 2  . isosorbide mononitrate (IMDUR) 30 MG 24 hr tablet TAKE 1 TABLET ONCE DAILY. 30 tablet 11  . latanoprost (XALATAN) 0.005 % ophthalmic solution Place 1 drop into both eyes at bedtime.    . levalbuterol (XOPENEX) 0.63 MG/3ML nebulizer solution Take 3 mLs (0.63 mg total) by nebulization every 6 (six) hours as needed for wheezing or shortness of breath. 75 mL 1  . LORazepam (ATIVAN) 0.5 MG tablet Take 1 mg by mouth at bedtime.     . Melatonin 5 MG TABS Take 1 tablet by mouth at bedtime.     . metoprolol succinate (TOPROL XL) 25 MG 24 hr tablet Take 1 tablet (25 mg total) by mouth at bedtime. 30 tablet 6  . metoprolol succinate (TOPROL-XL) 50 MG 24 hr tablet Take 3 tablets (150 mg total) by mouth daily. 90 tablet 11  . metoprolol tartrate (LOPRESSOR) 25 MG tablet TAKE 1 TABLET AS NEEDED FOR IRREGULAR HEART RATE. 15 tablet 1  . Multiple Vitamins-Iron (MULTIVITAMINS WITH IRON) TABS Take 1 tablet by mouth 2 (two) times daily.     [name] omega-3 acid ethyl esters (LOVAZA) 1 G capsule Take 1 g by mouth daily.     [name] omeprazole (PRILOSEC) 20 MG capsule Take 20 mg by mouth daily.     Glory Rosebush DELICA LANCETS FINE MISC 1 Units by  Does not apply route as directed. 100 each 3  . PARoxetine (PAXIL) 10 MG tablet Take 25 mg by mouth daily.     . quinapril (ACCUPRIL) 40 MG tablet Take 1 tablet (40 mg total) by mouth daily. 90 tablet 3  . senna (SENOKOT) 8.6 MG TABS Take 1 tablet by mouth daily.     . sitaGLIPtin-metformin (JANUMET) 50-1000 MG tablet 1/2 tablet in the morning and 1 tablet in the evening 140 tablet 3  . XARELTO 20 MG TABS tablet TAKE 1 TABLET EACH DAY. 30 tablet 5  . XOPENEX HFA 45 MCG/ACT inhaler USE 1 TO 2 PUFFS EVERY 6 HOURS AS NEEDED FOR WHEEZING. 15 g 0   No current facility-administered medications for this encounter.     Allergies  Allergen Reactions  . Benadryl [Diphenhydramine Hcl] Other (See Comments)    Restless leg  . Clopidogrel Bisulfate Nausea Only    Nausea & pain  . Contrast Media [Iodinated Diagnostic Agents] Palpitations    Rapid heart rate, hot    Social History   Social History  . Marital status: Married    Spouse name: N/A  . Number of children: 2  . Years of education: master's   Occupational History  . 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 Smoking status: Former Smoker    Packs/day: 1.50    Years: 30.00    Types: Cigarettes    Quit date: 08/13/1993  . Smokeless tobacco: Never Used  . Alcohol use No  . Drug use: No  . Sexual activity: Not on file   Other Topics Concern  . Not on file   Social History Narrative  . No narrative on file    Family History  Problem Relation Age of Onset  . Heart attack Father 74    2nd MI at 60  . Colon cancer Brother 61  . Gout Brother   . Ulcers Mother   . Emphysema Mother 74  . Colon polyps Sister   .  Cancer Sister     Basal cell carcinoma  . Pneumonia Maternal Grandmother   . Hypertension Brother   . Hyperlipidemia Brother   . Cancer Brother     Skin  . Diabetes Neg Hx   . Stroke Neg Hx     ROS- All systems are reviewed and negative except as per the HPI above  Physical  Exam: Vitals:   07/18/16 1358  BP: (!) 144/88  Pulse: 95  Weight: 197 lb (89.4 kg)  Height: 5\' 2"  (1.575 m)    GEN- The patient is well appearing, alert and oriented x 3 today.   Head- normocephalic, atraumatic Eyes-  Sclera clear, conjunctiva pink Ears- hearing intact Oropharynx- clear Neck- supple, no JVP Lymph- no cervical lymphadenopathy Lungs- Clear to ausculation bilaterally, normal work of breathing Heart- irregular rate and rhythm, no murmurs, rubs or gallops, PMI not laterally displaced GI- soft, NT, ND, + BS Extremities- no clubbing, cyanosis, or edema MS- no significant deformity or atrophy Skin- no rash or lesion Psych- euthymic mood, full affect Neuro- strength and sensation are intact  EKG- not done today Epic records reviewed  Echo Study Conclusions 07/13/16  - Left ventricle: The cavity size was normal. Wall thickness was   increased in a pattern of mild LVH. Systolic function was normal.   The estimated ejection fraction was in the range of 60% to 65%.   Incoordinate septal motion. Low normal GLPSS at -18% with mild   anteroseptal and inferoseptal strain abnormality. The study is   not technically sufficient to allow evaluation of LV diastolic   function. - Aortic valve: Sclerosis without stenosis. There was no   regurgitation. - Mitral valve: Calcified annulus. There was mild regurgitation. - Left atrium: Moderately dilated. - Right ventricle: The cavity size was mildly dilated. Systolic   function was low normal. - Right atrium: The atrium was mildly dilated. - Inferior vena cava: The vessel was normal in size. The   respirophasic diameter changes were in the normal range (= 50%),   consistent with normal central venous pressure.  Impressions:  - Compared to a prior echo in 2015, the LVEF is higher at 60-65%.   There is now moderate LAE and mild RAE, mild MR and atrial   fibrillation is noted.    Assessment and Plan:  1. Symptomatic  persistent afib Successful cardioversion but ERAF Continue 25 mg metoprolol at hs for better rate control Discussed all antiarrythmic's but she is unable to stop antidepressant which has qtc prolonging effect  On young side for amiodrone 1c agents are not an option due to CAD Continue  xarelto  2. Lifestyle risk factors Pt states 40 lb weight loss and congratulated on her success and to continue with efforts Has spinal stenosis and does not exercise Encouraged to water walk or try recumbent bike which the pt thinks she can do without pain. Currently inactive due to left foot fx  3. Snoring/possible sleep apnea Pt is scheduled for sleep study which has been rescheduled to January  4. HTN stable  5. CAD Stress test within the last year low risk for ischemia No current angina symptoms   I will discuss with Dr. Rayann Heman re pt's options to restore SR  [name], [facility] [address] [place], [place] [address] [phone]

## 2016-07-30 ENCOUNTER — Other Ambulatory Visit (INDEPENDENT_AMBULATORY_CARE_PROVIDER_SITE_OTHER): Payer: Medicare Other

## 2016-07-30 ENCOUNTER — Ambulatory Visit (INDEPENDENT_AMBULATORY_CARE_PROVIDER_SITE_OTHER): Payer: Medicare Other | Admitting: Internal Medicine

## 2016-07-30 ENCOUNTER — Encounter: Payer: Self-pay | Admitting: Internal Medicine

## 2016-07-30 VITALS — BP 116/58 | HR 88 | Temp 97.5°F | Resp 14 | Ht 62.0 in | Wt 196.0 lb

## 2016-07-30 DIAGNOSIS — E1159 Type 2 diabetes mellitus with other circulatory complications: Secondary | ICD-10-CM

## 2016-07-30 DIAGNOSIS — Z23 Encounter for immunization: Secondary | ICD-10-CM | POA: Diagnosis not present

## 2016-07-30 DIAGNOSIS — E1142 Type 2 diabetes mellitus with diabetic polyneuropathy: Secondary | ICD-10-CM | POA: Diagnosis not present

## 2016-07-30 LAB — LIPID PANEL
Cholesterol: 132 mg/dL (ref 0–200)
HDL: 47.7 mg/dL (ref >39.00)
LDL Cholesterol: 57 mg/dL (ref 0–99)
NonHDL: 83.95
Total CHOL/HDL Ratio: 3
Triglycerides: 134 mg/dL (ref 0.0–149.0)
VLDL: 26.8 mg/dL (ref 0.0–40.0)

## 2016-07-30 LAB — HEMOGLOBIN A1C: Hgb A1c MFr Bld: 6.6 % — ABNORMAL HIGH (ref 4.6–6.5)

## 2016-07-30 MED ORDER — LORAZEPAM 0.5 MG PO TABS: 1.0000 mg | ORAL_TABLET | Freq: Every day | ORAL | 3 refills | Status: DC

## 2016-07-30 MED ORDER — PAROXETINE HCL 10 MG PO TABS: 25.0000 mg | ORAL_TABLET | Freq: Every day | ORAL | 1 refills | Status: DC

## 2016-07-30 NOTE — Progress Notes (Signed)
   Subjective:    Patient ID: [name], female    DOB: 1951-06-07, 65 y.o.   MRN: [id number]  HPI The patient is a 65 YO female coming in for follow up of her diabetes (previously at goal on invokana and janumet, taking quinapril and crestor, complicated by CAD and polyneuropathy which are stable, taking gabapentin for neuropathy) and her blood pressure (taking quinapril, metoprolol, imdur, diltiazem, BP at goal, no headaches or chest pains or SOB). No new concerns. Eye exam christine Lucinda Dell ([place] [facility]htho) in December with no changes. Unable to do foot exam on the left foot due to in a boot and cannot remove.   Review of Systems  Constitutional: Negative for activity change, appetite change, fatigue, fever and unexpected weight change.  Respiratory: Negative.   Cardiovascular: Negative.   Gastrointestinal: Negative.   Musculoskeletal: Negative.   Skin: Negative.   Neurological: Positive for numbness. Negative for dizziness, facial asymmetry and weakness.      Objective:   Physical Exam  Constitutional: She appears well-developed and well-nourished.  Overweight  HENT:  Head: Normocephalic and atraumatic.  Eyes: EOM are normal.  Neck: Normal range of motion.  Cardiovascular: Normal rate and regular rhythm.   Pulmonary/Chest: Effort normal. No respiratory distress. She has no wheezes. She has no rales.  Abdominal: Soft.  Skin: Skin is warm and dry.  See foot exam   Vitals:   07/30/16 1337  BP: (!) 116/58  Pulse: 88  Resp: 14  Temp: 97.5 F (36.4 C)  TempSrc: Oral  SpO2: 98%  Weight: 196 lb (88.9 kg)  Height: 5\' 2"  (1.575 m)      Assessment & Plan:  Flu shot given at visit.

## 2016-07-30 NOTE — Assessment & Plan Note (Signed)
Taking gabapentin for the neuropathy pains which are stable.

## 2016-07-30 NOTE — Progress Notes (Signed)
Pre visit review using our clinic review tool, if applicable. No additional management support is needed unless otherwise documented below in the visit note. 

## 2016-07-30 NOTE — Assessment & Plan Note (Signed)
Checking HgA1c, previously at goal on her invokana and janumet. She denies side effects of cost problems. Will adjust as needed. We need to get records of her eye exam (prior at [facility] with christine mcewan (sp?) which was done in the last 2 weeks.

## 2016-07-30 NOTE — Patient Instructions (Addendum)
We have given you the flu shot and refilled your paxil and lorazepam.   You are due for a physical and it is recommended you schedule this for your next visit.   We are checking the labs today and will send on mychart.   Diabetes Mellitus and Standards of Medical Care Managing diabetes (diabetes mellitus) can be complicated. Your diabetes treatment may be managed by a team of health care providers, including:  A diet and nutrition specialist (registered dietitian).  A nurse.  A certified diabetes educator (CDE).  A diabetes specialist (endocrinologist).  An eye doctor.  A primary care provider.  A dentist. Your health care providers follow a schedule in order to help you get the best quality of care. The following schedule is a general guideline for your diabetes management plan. Your health care providers may also give you more specific instructions. HbA1c ( hemoglobin A1c) test This test provides information about blood sugar (glucose) control over the previous 2-3 months. It is used to check whether your diabetes management plan needs to be adjusted.  If you are meeting your treatment goals, this test is done at least 2 times a year.  If you are not meeting treatment goals or if your treatment goals have changed, this test is done 4 times a year. Blood pressure test  This test is done at every routine medical visit. For most people, the goal is less than 140/90. In some cases, your goal blood pressure may be 130/80 or less. Ask your health care provider what your goal blood pressure should be. Dental and eye exams  Visit your dentist two times a year.  If you have type 1 diabetes, get an eye exam 3-5 years after you are diagnosed, and then once a year after your first exam.  If you were diagnosed with type 1 diabetes as a child, get an eye exam when you are age 57 or older and have had diabetes for 3-5 years. After the first exam, you should get an eye exam once a  year.  If you have type 2 diabetes, have an eye exam as soon as you are diagnosed, and then once a year after your first exam. Foot care exam  Visual foot exams are done at every routine medical visit. The exams check for cuts, bruises, redness, blisters, sores, or other problems with the feet.  A complete foot exam is done by your health care provider once a year. This exam includes an inspection of the structure and skin of your feet, and a check of the pulses and sensation in your feet.  Type 1 diabetes: Get your first exam 3-5 years after diagnosis.  Type 2 diabetes: Get your first exam as soon as you are diagnosed.  Check your feet every day for cuts, bruises, redness, blisters, or sores. If you have any of these or other problems that are not healing, contact your health care provider. Kidney function test ( urine microalbumin)  This test is done once a year.  Type 1 diabetes: Get your first test 5 years after diagnosis.  Type 2 diabetes: Get your first test as soon as you are diagnosed.  If you have chronic kidney disease (CKD), get a serum creatinine and estimated glomerular filtration rate (eGFR) test once a year. Lipid profile (cholesterol, HDL, LDL, triglycerides)  This test should be done when you are diagnosed with diabetes, and every 5 years after the first test. If you are on medicines to lower your cholesterol,  you may need to get this test done every year.  The goal for LDL is less than 100 mg/dL (5.5 mmol/L). If you are at high risk, the goal is less than 70 mg/dL (3.9 mmol/L).  The goal for HDL is 40 mg/dL (2.2 mmol/L) for men and 50 mg/dL(2.8 mmol/L) for women. An HDL cholesterol of 60 mg/dL (3.3 mmol/L) or higher gives some protection against heart disease.  The goal for triglycerides is less than 150 mg/dL (8.3 mmol/L). Immunizations  The yearly flu (influenza) vaccine is recommended for everyone 6 months or older who has diabetes.  The pneumonia  (pneumococcal) vaccine is recommended for everyone 2 years or older who has diabetes. If you are 16 or older, you may get the pneumonia vaccine as a series of two separate shots.  The hepatitis B vaccine is recommended for adults shortly after they have been diagnosed with diabetes.  The Tdap (tetanus, diphtheria, and pertussis) vaccine should be given:  According to normal childhood vaccination schedules, for children.  Every 10 years, for adults who have diabetes.  The shingles vaccine is recommended for people who have had chicken pox and are 50 years or older. Mental and emotional health  Screening for symptoms of eating disorders, anxiety, and depression is recommended at the time of diagnosis and afterward as needed. If your screening shows that you have symptoms (you have a positive screening result), you may need further evaluation and be referred to a mental health care provider. Diabetes self-management education  Education about how to manage your diabetes is recommended at diagnosis and ongoing as needed. Treatment plan  Your treatment plan will be reviewed at every medical visit. Summary  Managing diabetes (diabetes mellitus) can be complicated. Your diabetes treatment may be managed by a team of health care providers.  Your health care providers follow a schedule in order to help you get the best quality of care.  Standards of care including having regular physical exams, blood tests, blood pressure monitoring, immunizations, screening tests, and education about how to manage your diabetes.  Your health care providers may also give you more specific instructions based on your individual health. This information is not intended to replace advice given to you by your health care provider. Make sure you discuss any questions you have with your health care provider. Document Released: 05/27/2009 Document Revised: 04/27/2016 Document Reviewed: 04/27/2016 Elsevier Interactive  Patient Education  2017 [employer].

## 2016-07-31 NOTE — Telephone Encounter (Signed)
I left message on pt's cell phone that I discussed her persistent afib  with Dr. Rayann Heman.  He was not so optimistic re restoring sinus rhythm with ablation in persistent afib . He preferred her to try AAD's first. Explained to pt that the antidepressant would cause concern with qtc prolonging drugs but pt does not want to stop antidepressant. We could try to start multaq but would require a cardioversion after 2-3 days on drug and then see what the qtc is in SR. She may want to wait until sleep study is completed as some of her fatigue could be from sleep apnea and it would undermine our ability to return her to [place].

## 2016-08-15 DIAGNOSIS — S92355D Nondisplaced fracture of fifth metatarsal bone, left foot, subsequent encounter for fracture with routine healing: Secondary | ICD-10-CM | POA: Diagnosis not present

## 2016-08-24 ENCOUNTER — Telehealth: Payer: Self-pay | Admitting: Cardiovascular Disease

## 2016-08-24 NOTE — Telephone Encounter (Signed)
Forward to scheduler.

## 2016-08-24 NOTE — Telephone Encounter (Signed)
New message    Patient returning call back to office. Stating someone had called her today

## 2016-08-24 NOTE — Telephone Encounter (Signed)
Left message for patien to call and schedule 6 mos appt with Kerin Ransom, PA

## 2016-09-12 DIAGNOSIS — S92355G Nondisplaced fracture of fifth metatarsal bone, left foot, subsequent encounter for fracture with delayed healing: Secondary | ICD-10-CM | POA: Diagnosis not present

## 2016-09-19 ENCOUNTER — Encounter: Payer: Self-pay | Admitting: Cardiology

## 2016-09-19 ENCOUNTER — Ambulatory Visit (INDEPENDENT_AMBULATORY_CARE_PROVIDER_SITE_OTHER): Payer: PPO | Admitting: Cardiology

## 2016-09-19 VITALS — BP 115/68 | HR 109 | Ht 62.0 in | Wt 197.0 lb

## 2016-09-19 DIAGNOSIS — I480 Paroxysmal atrial fibrillation: Secondary | ICD-10-CM

## 2016-09-19 DIAGNOSIS — E662 Morbid (severe) obesity with alveolar hypoventilation: Secondary | ICD-10-CM | POA: Diagnosis not present

## 2016-09-19 DIAGNOSIS — F329 Major depressive disorder, single episode, unspecified: Secondary | ICD-10-CM

## 2016-09-19 DIAGNOSIS — F32A Depression, unspecified: Secondary | ICD-10-CM

## 2016-09-19 DIAGNOSIS — I2510 Atherosclerotic heart disease of native coronary artery without angina pectoris: Secondary | ICD-10-CM

## 2016-09-19 DIAGNOSIS — I10 Essential (primary) hypertension: Secondary | ICD-10-CM

## 2016-09-19 DIAGNOSIS — Z9861 Coronary angioplasty status: Secondary | ICD-10-CM

## 2016-09-19 MED ORDER — DILTIAZEM HCL 60 MG PO TABS: 60.0000 mg | ORAL_TABLET | Freq: Every day | ORAL | 1 refills | Status: DC | PRN

## 2016-09-19 NOTE — Assessment & Plan Note (Signed)
She has not been able to schedule a sleep study yet- secondary to her Lt foot in a cast

## 2016-09-19 NOTE — Assessment & Plan Note (Signed)
Controlled.  

## 2016-09-19 NOTE — Assessment & Plan Note (Signed)
Last CV Nov 2017. Pt declines to come off her antidepressant to start ani arrhythmic therapy.  In the office today she complains of higher HR than usual with feelings of fatigue and chest discomfort

## 2016-09-19 NOTE — Patient Instructions (Signed)
Medication Instructions:  BEGIN taking diltiazem (Cardizem) 60 mg (1 tablet) one time daily AS NEEDED for tachycardia or fast heart rate.  Labwork: NONE  Testing/Procedures: NONE  Follow-Up: Your physician wants you to follow-up in: [place]. You will receive a reminder letter in the mail two months in advance. If you don't receive a letter, please call our office to schedule the follow-up appointment.   Any Other Special Instructions Will Be Listed Below (If Applicable).     If you need a refill on your cardiac medications before your next appointment, please call your pharmacy.

## 2016-09-19 NOTE — Assessment & Plan Note (Signed)
On Paxil (potential QTc prolongation)

## 2016-09-19 NOTE — Assessment & Plan Note (Signed)
Myoview low risk Aug 2016

## 2016-09-19 NOTE — Progress Notes (Signed)
09/19/2016 [name]   [birth year]  [id number]  Primary Physician Hoyt Koch, MD Primary Cardiologist: Dr Gwenlyn Found Dr Rayann Heman  HPI:  65-year-obese divorced Caucasian female, who is formerly a patient of Dr. Ky Barban now followed by Dr Gwenlyn Found. She works full time as a Loss adjuster, [employer] and exercise psychologist. She has a history of CAD- Dr. Melvern Banker stented her distal circumflex posterolateral branch, May 26, 2008, with some residual disease in the PDA branch, and normal LV function. Because of her recurrent chest pain cardiac catheterization was done on April 20, 2011, revealing a patent stent, 50% distal PDA lesion, with normal LV function. Her last Myoview was Aug 2016 and was low risk.  Other problems include morbid obesity and suspected sleep apnea, DM with neuropathy, HTN, HLD,depression, and atrial fibrillation. She has been followed at the AF clinic- see complete notes by Roderic Palau. She has been reluctant to stop her antidepressant and therefor we have not been able to use certain antiarrhythmics. She has CAD and a 1C agent is contraindicated and she is too young for Amiodarone. Dr Rayann Heman does not feel she is a candidate for RFA.   She is in the office today for a 6 month check up. She complains of fatigue and vague chest discomfort related to her AF when her rate runs high. Her rate is 109.    Current Outpatient Prescriptions  Medication Sig Dispense Refill  . ascorbic acid (VITAMIN C) 1000 MG tablet Take 1,000 mg by mouth 2 (two) times daily.     . budesonide-formoterol (SYMBICORT) 160-4.5 MCG/ACT inhaler USE 2 PUFFS TWICE DAILY 10.2 g 0  . CALCIUM-MAG-VIT C-VIT D PO Take 2 tablets by mouth daily.     . celecoxib (CELEBREX) 200 MG capsule Take 200 mg by mouth 2 (two) times daily.     . CRESTOR 10 MG tablet TAKE ONE TABLET AT BEDTIME. 30 tablet 7  . diltiazem (CARDIZEM CD) 180 MG 24 hr capsule TAKE 1 CAPSULE DAILY. 30 capsule 11  . estradiol (ESTRACE) 0.5 MG tablet Take 0.5  mg by mouth daily.     [name] gabapentin (NEURONTIN) 100 MG capsule Take 1 capsule (100 mg total) by mouth 3 (three) times daily. 180 capsule 3  . guaiFENesin (MUCINEX) 600 MG 12 hr tablet Take 600 mg by mouth 2 (two) times daily.    [name] HYDROcodone-acetaminophen (NORCO/VICODIN) 5-325 MG tablet   0  . INVOKANA 100 MG TABS tablet TAKE 1 TABLET ONCE DAILY. 90 tablet 2  . isosorbide mononitrate (IMDUR) 30 MG 24 hr tablet TAKE 1 TABLET ONCE DAILY. 30 tablet 11  . latanoprost (XALATAN) 0.005 % ophthalmic solution Place 1 drop into both eyes at bedtime.    . levalbuterol (XOPENEX) 0.63 MG/3ML nebulizer solution Take 3 mLs (0.63 mg total) by nebulization every 6 (six) hours as needed for wheezing or shortness of breath. 75 mL 1  . LORazepam (ATIVAN) 0.5 MG tablet Take 2-4 tablets (1-2 mg total) by mouth at bedtime. 60 tablet 3  . Melatonin 5 MG TABS Take 1 tablet by mouth at bedtime.     . metoprolol succinate (TOPROL XL) 25 MG 24 hr tablet Take 1 tablet (25 mg total) by mouth at bedtime. 30 tablet 6  . metoprolol succinate (TOPROL-XL) 50 MG 24 hr tablet Take 3 tablets (150 mg total) by mouth daily. 90 tablet 11  . Multiple Vitamins-Iron (MULTIVITAMINS WITH IRON) TABS Take 1 tablet by mouth 2 (two) times daily.     [name] nystatin  ointment (MYCOSTATIN)   0  . omega-3 acid ethyl esters (LOVAZA) 1 G capsule Take 1 g by mouth daily.     [name] omeprazole (PRILOSEC) 20 MG capsule Take 20 mg by mouth daily.     Glory Rosebush DELICA LANCETS FINE MISC 1 Units by Does not apply route as directed. 100 each 3  . PARoxetine (PAXIL) 10 MG tablet Take 2.5 tablets (25 mg total) by mouth daily. 225 tablet 1  . quinapril (ACCUPRIL) 40 MG tablet Take 1 tablet (40 mg total) by mouth daily. 90 tablet 3  . senna (SENOKOT) 8.6 MG TABS Take 1 tablet by mouth daily.     . sitaGLIPtin-metformin (JANUMET) 50-1000 MG tablet 1/2 tablet in the morning and 1 tablet in the evening 140 tablet 3  . XARELTO 20 MG TABS tablet TAKE 1 TABLET EACH DAY. 30  tablet 5  . XOPENEX HFA 45 MCG/ACT inhaler USE 1 TO 2 PUFFS EVERY 6 HOURS AS NEEDED FOR WHEEZING. 15 g 0  . diltiazem (CARDIZEM) 60 MG tablet Take 1 tablet (60 mg total) by mouth daily as needed. AS NEEDED FOR TACHYCARDIA 20 tablet 1   No current facility-administered medications for this visit.     Allergies  Allergen Reactions  . Benadryl [Diphenhydramine Hcl] Other (See Comments)    Restless leg  . Clopidogrel Bisulfate Nausea Only    Nausea & pain  . Contrast Media [Iodinated Diagnostic Agents] Palpitations    Rapid heart rate, hot    Social History   Social History  . Marital status: Married    Spouse name: N/A  . Number of children: 2  . 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 Smoking status: Former Smoker    Packs/day: 1.50    Years: 30.00    Types: Cigarettes    Quit date: 08/13/1993  . Smokeless tobacco: Never Used  . Alcohol use No  . Drug use: No  . Sexual activity: Not on file   Other Topics Concern  . Not on file   Social History Narrative  . No narrative on file     Review of Systems: General: negative for chills, fever, night sweats or weight changes.  Cardiovascular: negative for chest pain, dyspnea on exertion, edema, orthopnea, palpitations, paroxysmal nocturnal dyspnea or shortness of breath Dermatological: negative for rash Respiratory: negative for cough or wheezing Urologic: negative for hematuria Abdominal: negative for nausea, vomiting, diarrhea, bright red blood per rectum, melena, or hematemesis Neurologic: negative for visual changes, syncope, or dizziness All other systems reviewed and are otherwise negative except as noted above.    Blood pressure 115/68, pulse (!) 109, height 5\' 2"  (1.575 m), weight 197 lb (89.4 kg).  General appearance: alert, cooperative, no distress and morbidly obese Lungs: clear to auscultation bilaterally Heart:  irregularly irregular rhythm Extremities: no edema Skin: Skin color, texture, turgor normal. No rashes or lesions Neurologic: Grossly normal  EKG AF with increased VR  ASSESSMENT AND PLAN:   PAF (paroxysmal atrial fibrillation) ([place]) Last CV Nov 2017. Pt declines to come off her antidepressant to start ani arrhythmic therapy.  In the office today she complains of higher HR than usual with feelings of fatigue and chest discomfort  Essential hypertension Controlled  CAD S/P percutaneous coronary angioplasty Myoview low risk Aug 2016  Depression On Paxil (potential QTc prolongation)  Obesity hypoventilation syndrome ([place]) She has not been able to schedule  a sleep study yet- secondary to her Lt foot in a cast   PLAN  I encouraged her to follow through with sleep study ASAP. I suggested she take diltiazem 60 mg once a day PRN tachycardia. F/U with Dr Gwenlyn Found in 6 months.   Kerin Ransom PA-C 09/19/2016 2:09 PM

## 2016-09-20 ENCOUNTER — Telehealth: Payer: Self-pay | Admitting: Cardiovascular Disease

## 2016-09-20 ENCOUNTER — Other Ambulatory Visit: Payer: Self-pay

## 2016-09-20 MED ORDER — ROSUVASTATIN CALCIUM 10 MG PO TABS: 10.0000 mg | ORAL_TABLET | Freq: Every day | ORAL | 3 refills | Status: DC

## 2016-09-20 NOTE — Telephone Encounter (Signed)
Attempted to return call to [employer] went to their VM line as all available representatives were busy   Will route to [place], CMA to follow up

## 2016-09-20 NOTE — Telephone Encounter (Signed)
New message    There are some questions that need answered before they can prescribe medication  Crestor 10 mg tablet

## 2016-09-21 ENCOUNTER — Telehealth: Payer: Self-pay | Admitting: Cardiovascular Disease

## 2016-09-21 NOTE — Telephone Encounter (Signed)
New Message  Pt c/o medication issue:  1. Name of Medication: rosuvastatin (Crestor) 10 mg tablet once daily  2. How are you currently taking this medication (dosage and times per day)? See above  3. Are you having a reaction (difficulty breathing--STAT)? N/A  4. What is your medication issue? Ivin Booty voiced there are orders that need to answered before we can process patient's medication.  Please f/u

## 2016-09-21 NOTE — Telephone Encounter (Signed)
Attempt to return call-no answer, lmtcb. 

## 2016-09-26 NOTE — Telephone Encounter (Signed)
Attempted to reach [facility] to clarify prescription.

## 2016-09-26 NOTE — Telephone Encounter (Signed)
Unable to get hold of rep w [facility]. Goes to a general VM basket. Left msg w available information including physician name and Ref #.

## 2016-09-27 MED ORDER — ROSUVASTATIN CALCIUM 10 MG PO TABS: 10.0000 mg | ORAL_TABLET | Freq: Every day | ORAL | 12 refills | Status: DC

## 2016-09-27 NOTE — Telephone Encounter (Signed)
Routing to Pharm to advise.  Crestor 10 has been denied by insurance.  Ok to switch to another medication?

## 2016-09-27 NOTE — Telephone Encounter (Signed)
New Message   Pt c/o medication issue:  1. Name of Medication:   rosuvastatin (CRESTOR) 10 MG tablet   2. How are you currently taking this medication (dosage and times per day)? Once day at night  3. Are you having a reaction (difficulty breathing--STAT)? No  4. What is your medication issue? Pt changed health insurance last month, and prescription was denied. Requesting call back from nurse.

## 2016-09-27 NOTE — Telephone Encounter (Signed)
Spoke with pt, crestor is not on the formulary for [employer]. She would like to stay on the crestor as it has worked best for her. She thinks she took lipitor in the past but thinks it was not effective on her numbers. Spoke to [employer], according to them the patient is wanting brand crestor and has never tried the generic. Patient agrees to try the generic. New script sent to the pharmacy and patient will have lab work drawn with PCP in a couple months.

## 2016-09-27 NOTE — Telephone Encounter (Signed)
Switch to atorvastatin 20 mg qd

## 2016-10-03 ENCOUNTER — Other Ambulatory Visit: Payer: Self-pay | Admitting: Cardiovascular Disease

## 2016-10-17 DIAGNOSIS — S92355G Nondisplaced fracture of fifth metatarsal bone, left foot, subsequent encounter for fracture with delayed healing: Secondary | ICD-10-CM | POA: Diagnosis not present

## 2016-10-23 ENCOUNTER — Other Ambulatory Visit: Payer: Self-pay | Admitting: Internal Medicine

## 2016-11-14 ENCOUNTER — Ambulatory Visit (HOSPITAL_COMMUNITY)
Admission: RE | Admit: 2016-11-14 | Discharge: 2016-11-14 | Disposition: A | Discharge status: Home / Self Care | Payer: PPO | Source: Ambulatory Visit | Attending: Nurse Practitioner | Admitting: Nurse Practitioner

## 2016-11-14 ENCOUNTER — Encounter (HOSPITAL_COMMUNITY): Payer: Self-pay | Admitting: Nurse Practitioner

## 2016-11-14 VITALS — BP 128/68 | HR 86 | Ht 62.0 in | Wt 189.0 lb

## 2016-11-14 DIAGNOSIS — D649 Anemia, unspecified: Secondary | ICD-10-CM | POA: Diagnosis not present

## 2016-11-14 DIAGNOSIS — M545 Low back pain: Secondary | ICD-10-CM | POA: Diagnosis not present

## 2016-11-14 DIAGNOSIS — M4800 Spinal stenosis, site unspecified: Secondary | ICD-10-CM | POA: Insufficient documentation

## 2016-11-14 DIAGNOSIS — I4819 Other persistent atrial fibrillation: Secondary | ICD-10-CM

## 2016-11-14 DIAGNOSIS — G8929 Other chronic pain: Secondary | ICD-10-CM | POA: Insufficient documentation

## 2016-11-14 DIAGNOSIS — E119 Type 2 diabetes mellitus without complications: Secondary | ICD-10-CM | POA: Insufficient documentation

## 2016-11-14 DIAGNOSIS — J449 Chronic obstructive pulmonary disease, unspecified: Secondary | ICD-10-CM | POA: Diagnosis not present

## 2016-11-14 DIAGNOSIS — I2510 Atherosclerotic heart disease of native coronary artery without angina pectoris: Secondary | ICD-10-CM | POA: Diagnosis not present

## 2016-11-14 DIAGNOSIS — E785 Hyperlipidemia, unspecified: Secondary | ICD-10-CM | POA: Insufficient documentation

## 2016-11-14 DIAGNOSIS — I481 Persistent atrial fibrillation: Secondary | ICD-10-CM | POA: Insufficient documentation

## 2016-11-14 DIAGNOSIS — I10 Essential (primary) hypertension: Secondary | ICD-10-CM | POA: Diagnosis not present

## 2016-11-14 DIAGNOSIS — Z7901 Long term (current) use of anticoagulants: Secondary | ICD-10-CM | POA: Diagnosis not present

## 2016-11-14 DIAGNOSIS — Z7984 Long term (current) use of oral hypoglycemic drugs: Secondary | ICD-10-CM | POA: Diagnosis not present

## 2016-11-14 DIAGNOSIS — I480 Paroxysmal atrial fibrillation: Secondary | ICD-10-CM | POA: Insufficient documentation

## 2016-11-14 DIAGNOSIS — K219 Gastro-esophageal reflux disease without esophagitis: Secondary | ICD-10-CM | POA: Insufficient documentation

## 2016-11-14 DIAGNOSIS — E669 Obesity, unspecified: Secondary | ICD-10-CM | POA: Diagnosis not present

## 2016-11-14 DIAGNOSIS — H409 Unspecified glaucoma: Secondary | ICD-10-CM | POA: Diagnosis not present

## 2016-11-14 MED ORDER — METOPROLOL SUCCINATE ER 25 MG PO TB24: 25.0000 mg | ORAL_TABLET | Freq: Every day | ORAL | 3 refills | Status: DC

## 2016-11-14 NOTE — Progress Notes (Signed)
Patient ID: [name], female   DOB: April 04, 1951, 66 y.o.   MRN: [id number]     Primary Care Physician: Janice Koch, MD Referring Physician: Dr. Ronald Morrison [name] is a 66 y.o. female with a h/o CAD, DM, HTN, obesity, PAF first diagnosed May of 2015 with successful DCCV x2, first in May 2015.Marland KitchenShe recently had an AF episode and metoprolol succinate was increased to 150 mg qd. She converted on this dose and has been maintaining SR.  No previous AAD. Chads vasc score of at least 5, on xarelto daily.   On last visit in the afib clinic, we discussed lifestyle triggers. Pt does not  smoke or drink alcohol.Her BP/Hgba1c is controlled. She is sedentary. She does snore, no previous sleep study. Sleep study is pending. We also discussed antiarrythmic, which she was not interested in at that time.  She saw Dr. Gwenlyn Morrison and was in afib. Amlodipine was stopped and cardizem was started. She converted within the day. Discussed AAD therapy  again, but she is still not interested since she is back in rhythm and would prefer to take a wait and see approach.  She is in the afib clinic 09/26/15 with c/o being in afib x 2 weeks. Symptomatic with fatigue and dyspnea with exertion. We discussed options to restore SR. She is not interested in antiarrythmic's at this time, would like a cardioversion. Last one held her in rhythm since 5/16. If she has ERAF, she will consider AAD.  She returns today in SR but is surprised her energy is not much better in SR. She has a mild cough, but mostly fatigue. She has a sleep study, pending. She had a stress test last summer which was low risk. Has noticed some increase in BP.  Pt is being seen again after some months with c/o afib for several weeks. She  has fatigue with afib. Again, options were discussed and she is still very resultant to start antiarrythmic's. She would not be a candidate for  1C agents. She is not interested in being hospitalized for a med and is  concerned re cost of multaq/tiksoyn. Side effects of amiodarone make her hesitatnt to use this drug.  F/u in afib clinic s/p cardioversion one week ago.She unfortunately has had ERAF. She felt great yesterday but was unaware that she was in afib today. Rate controlled but with v rate of upper 90's and could benefit from more rate control. Again, rate control was discussed and she is not candidate for 1c agents, does not want to stop Paxil to allow for tikosyn to be used. She is pending echo 12/1 and a sleep study. Multaq may be an option in conjunction with another cardioversion at a later date.   F/u in afib clinic 12/6. She continues to be symptomatic with afib. She visited her family in [place] over [place],and fell and fx her foot which is in a boot. We discussed antiarrythmic's as well and she still can not decide on a good option. She is not willing to come off antidepressant which would be an issue with qtc prolonging drugs such as multaq, sotalol, tikosyn. She had to delay her sleep study until January due to foot fx. Echo updated and stable.  F/u in the afib clinic 4/4. She continues in rate controlled afib. We have discussed antiarrythmic's but she does not like any of the options given. She feels that she is tolerating the afib OK. Continues on xarelto without bleeding issues. Saw Janice Joiner  Morrison for general cardiology concerns/CAD and thought to be stable. Pt states that she has felt bad for a couple of weeks but thought it was due to chest congestion. Feeling better today.  Today, she denies symptoms of palpitations, chest pain, shortness of breath, orthopnea, PND, lower extremity edema, dizziness, presyncope, syncope, or neurologic sequela. The patient is tolerating medications without difficulties and is otherwise without complaint today.   Past Medical History:  Diagnosis Date  . Anemia, unspecified   . Arthritis   . Asthma   . Atrial fibrillation with RVR ([place])    a. on Xarelto  .  Bell's palsy    Facial nerve decompression in 2001  . Chronic low back pain   . COPD with asthma ([place])   . Coronary artery disease    Myoview 04/12/11 was entirely normal. ECHO 02/26/08 showed only minor abnormalities. Stenting 05/26/08 of her posterolateral branch to the left circumflex coronary artery. Used a 2.5x6mm Taxus Monorail stent.myoview 2014 was without ischemia  . Diabetes mellitus    Type 2  . GERD (gastroesophageal reflux disease)   . Glaucoma   . Goiter   . Heart murmur   . History of nuclear stress test 2012; 2014   lexiscan; normal pattern of perfusion; normal, low risk scan   . Hyperlipidemia   . Hypertension   . Pneumonia 2008  . Polycystic ovary disease    Hysterectomy in 1982 for this  . Shortness of breath dyspnea    ECHO 02/26/08 showed only minor abnormalities  . Spinal stenosis    Past Surgical History:  Procedure Laterality Date  . ABDOMINAL HYSTERECTOMY  1982   & BSO; for polycystic ovary disease  . CARDIOVERSION N/A 12/17/2013   Procedure: CARDIOVERSION;  Surgeon: Pixie Casino, MD;  Location: [facility] ENDOSCOPY;  Service: Cardiovascular;  Laterality: N/A;  . CARDIOVERSION N/A 09/30/2015   Procedure: CARDIOVERSION;  Surgeon: Dorothy Spark, MD;  Location: [facility] ENDOSCOPY;  Service: Cardiovascular;  Laterality: N/A;  . CARDIOVERSION N/A 06/22/2016   Procedure: CARDIOVERSION;  Surgeon: Skeet Latch, MD;  Location: [place];  Service: Cardiovascular;  Laterality: N/A;  . COLONOSCOPY     last 2009; Dr Cristina Gong; due 2019  . CORONARY ANGIOPLASTY  05/26/2008   Stenting of her posterolateral branch to the left circumflex coronary artery. Used a 2.5x12mm Taxus Monorail stent.  [name] FACIAL NERVE DECOMPRESSION  2001/2002   bells palsy   . LAPAROSCOPIC CHOLECYSTECTOMY  06/15/2011    Dr Dalbert Batman  . TEE WITHOUT CARDIOVERSION N/A 12/17/2013   Procedure: TRANSESOPHAGEAL ECHOCARDIOGRAM (TEE);  Surgeon: Pixie Casino, MD;  Location[facility] ENDOSCOPY;  Service:  Cardiovascular;  Laterality: N/A;  trish/ja  . TRANSTHORACIC ECHOCARDIOGRAM  02/20/6268   LV systolic function normal with mild conc LVH; LA mildly dilated; trace MR/TR  . UPPER GI ENDOSCOPY  2009   negative    Current Outpatient Prescriptions  Medication Sig Dispense Refill  . ascorbic acid (VITAMIN C) 1000 MG tablet Take 1,000 mg by mouth 2 (two) times daily.     . budesonide-formoterol (SYMBICORT) 160-4.5 MCG/ACT inhaler USE 2 PUFFS TWICE DAILY 10.2 g 0  . CALCIUM-MAG-VIT C-VIT D PO Take 2 tablets by mouth daily.     . celecoxib (CELEBREX) 200 MG capsule Take 200 mg by mouth 2 (two) times daily.     [name] diltiazem (CARDIZEM CD) 180 MG 24 hr capsule TAKE 1 CAPSULE DAILY. 30 capsule 11  . estradiol (ESTRACE) 0.5 MG tablet Take 0.5 mg by mouth daily.     [name]  gabapentin (NEURONTIN) 100 MG capsule TAKE (1) CAPSULE THREE TIMES DAILY. 180 capsule 2  . guaiFENesin (MUCINEX) 600 MG 12 hr tablet Take 600 mg by mouth 2 (two) times daily.    [name] HYDROcodone-acetaminophen (NORCO/VICODIN) 5-325 MG tablet   0  . INVOKANA 100 MG TABS tablet TAKE 1 TABLET ONCE DAILY. 90 tablet 2  . isosorbide mononitrate (IMDUR) 30 MG 24 hr tablet TAKE 1 TABLET ONCE DAILY. 30 tablet 11  . latanoprost (XALATAN) 0.005 % ophthalmic solution Place 1 drop into both eyes at bedtime.    . levalbuterol (XOPENEX) 0.63 MG/3ML nebulizer solution Take 3 mLs (0.63 mg total) by nebulization every 6 (six) hours as needed for wheezing or shortness of breath. 75 mL 1  . LORazepam (ATIVAN) 0.5 MG tablet Take 2-4 tablets (1-2 mg total) by mouth at bedtime. 60 tablet 3  . Melatonin 5 MG TABS Take 1 tablet by mouth at bedtime.     . metoprolol succinate (TOPROL XL) 25 MG 24 hr tablet Take 1 tablet (25 mg total) by mouth at bedtime. 90 tablet 3  . metoprolol succinate (TOPROL-XL) 50 MG 24 hr tablet Take 3 tablets (150 mg total) by mouth daily. 90 tablet 11  . Multiple Vitamins-Iron (MULTIVITAMINS WITH IRON) TABS Take 1 tablet by mouth 2 (two) times  daily.     [name] nystatin ointment (MYCOSTATIN)   0  . omega-3 acid ethyl esters (LOVAZA) 1 G capsule Take 1 g by mouth daily.     [name] omeprazole (PRILOSEC) 20 MG capsule Take 20 mg by mouth daily.     Glory Rosebush DELICA LANCETS FINE MISC 1 Units by Does not apply route as directed. 100 each 3  . PARoxetine (PAXIL) 10 MG tablet Take 2.5 tablets (25 mg total) by mouth daily. 225 tablet 1  . quinapril (ACCUPRIL) 40 MG tablet Take 1 tablet (40 mg total) by mouth daily. 90 tablet 3  . rosuvastatin (CRESTOR) 10 MG tablet Take 1 tablet (10 mg total) by mouth at bedtime. 30 tablet 12  . senna (SENOKOT) 8.6 MG TABS Take 1 tablet by mouth daily.     . sitaGLIPtin-metformin (JANUMET) 50-1000 MG tablet 1/2 tablet in the morning and 1 tablet in the evening 140 tablet 3  . XARELTO 20 MG TABS tablet TAKE 1 TABLET EACH DAY. 30 tablet 5  . XOPENEX HFA 45 MCG/ACT inhaler USE 1 TO 2 PUFFS EVERY 6 HOURS AS NEEDED FOR WHEEZING. 15 g 0  . diltiazem (CARDIZEM) 60 MG tablet Take 1 tablet (60 mg total) by mouth daily as needed. AS NEEDED FOR TACHYCARDIA (Patient not taking: Reported on 11/14/2016) 20 tablet 1   No current facility-administered medications for this encounter.     Allergies  Allergen Reactions  . Benadryl [Diphenhydramine Hcl] Other (See Comments)    Restless leg  . Clopidogrel Bisulfate Nausea Only    Nausea & pain  . Contrast Media [Iodinated Diagnostic Agents] Palpitations    Rapid heart rate, hot    Social History   Social History  . Marital status: Married    Spouse name: N/A  . Number of children: 2  . 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 Smoking status: Former Smoker    Packs/day: 1.50    Years: 30.00    Types: Cigarettes    Quit date: 08/13/1993  . Smokeless tobacco: Never Used  . Alcohol use No  .  Drug use: No  . Sexual activity: Not on file   Other Topics Concern  . Not on file   Social  History Narrative  . No narrative on file    Family History  Problem Relation Age of Onset  . Heart attack Father 59    2nd MI at 37  . Colon cancer Brother 28  . Gout Brother   . Ulcers Mother   . Emphysema Mother 57  . Colon polyps Sister   . Cancer Sister     Basal cell carcinoma  . Pneumonia Maternal Grandmother   . Hypertension Brother   . Hyperlipidemia Brother   . Cancer Brother     Skin  . Diabetes Neg Hx   . Stroke Neg Hx     ROS- All systems are reviewed and negative except as per the HPI above  Physical Exam: Vitals:   11/14/16 1403  BP: 128/68  Pulse: 86  Weight: 189 lb (85.7 kg)  Height: 5\' 2"  (1.575 m)    GEN- The patient is well appearing, alert and oriented x 3 today.   Head- normocephalic, atraumatic Eyes-  Sclera clear, conjunctiva pink Ears- hearing intact Oropharynx- clear Neck- supple, no JVP Lymph- no cervical lymphadenopathy Lungs- Clear to ausculation bilaterally, normal work of breathing Heart- irregular rate and rhythm, no murmurs, rubs or gallops, PMI not laterally displaced GI- soft, NT, ND, + BS Extremities- no clubbing, cyanosis, or edema MS- no significant deformity or atrophy Skin- no rash or lesion Psych- euthymic mood, full affect Neuro- strength and sensation are intact  EKG-afib at 86 bpm, qrs int 78 ms, qtc 454 ms Epic records reviewed  Echo Study Conclusions 07/13/16  - Left ventricle: The cavity size was normal. Wall thickness was   increased in a pattern of mild LVH. Systolic function was normal.   The estimated ejection fraction was in the range of 60% to 65%.   Incoordinate septal motion. Low normal GLPSS at -18% with mild   anteroseptal and inferoseptal strain abnormality. The study is   not technically sufficient to allow evaluation of LV diastolic   function. - Aortic valve: Sclerosis without stenosis. There was no   regurgitation. - Mitral valve: Calcified annulus. There was mild regurgitation. - Left  atrium: Moderately dilated. - Right ventricle: The cavity size was mildly dilated. Systolic   function was low normal. - Right atrium: The atrium was mildly dilated. - Inferior vena cava: The vessel was normal in size. The   respirophasic diameter changes were in the normal range (= 50%),   consistent with normal central venous pressure.  Impressions:  - Compared to a prior echo in 2015, the LVEF is higher at 60-65%.   There is now moderate LAE and mild RAE, mild MR and atrial   fibrillation is noted.    Assessment and Plan:  1. Symptomatic persistent afib Previous successful cardioversion 06/22/16 but ERAF Continue metoprolol 150 mg q am Continue 25 mg metoprolol at hs for better rate control Discussed all antiarrythmic's but she is unable to stop antidepressant which has qtc prolonging effect  On young side for amiodrone 1c agents are not an option due to CAD Not an candidate for ablation due to being persistent, never being on antiarrythmic Continue  xarelto  2. Lifestyle risk factors Continue with lifestyle efforts Has spinal stenosis and does not exercise Encouraged to water walk or try recumbent bike which the pt thinks she can do without pain. Currently inactive due to left foot  fx  3. Snoring/possible sleep apnea Pt never carried thru with sleep study  4. HTN stable  5. CAD Stress test within the last 18 months low risk for ischemia No current angina symptoms    Geroge Baseman. Hance Caspers, [facility] [address] [place], [place] [address] [phone]

## 2016-11-15 ENCOUNTER — Other Ambulatory Visit (INDEPENDENT_AMBULATORY_CARE_PROVIDER_SITE_OTHER): Payer: PPO

## 2016-11-15 ENCOUNTER — Encounter: Payer: Self-pay | Admitting: Internal Medicine

## 2016-11-15 ENCOUNTER — Ambulatory Visit (INDEPENDENT_AMBULATORY_CARE_PROVIDER_SITE_OTHER): Payer: PPO | Admitting: Internal Medicine

## 2016-11-15 VITALS — BP 138/68 | HR 64 | Resp 12 | Ht 62.0 in | Wt 189.0 lb

## 2016-11-15 DIAGNOSIS — E1151 Type 2 diabetes mellitus with diabetic peripheral angiopathy without gangrene: Secondary | ICD-10-CM

## 2016-11-15 DIAGNOSIS — E1159 Type 2 diabetes mellitus with other circulatory complications: Secondary | ICD-10-CM | POA: Diagnosis not present

## 2016-11-15 DIAGNOSIS — I480 Paroxysmal atrial fibrillation: Secondary | ICD-10-CM

## 2016-11-15 DIAGNOSIS — E662 Morbid (severe) obesity with alveolar hypoventilation: Secondary | ICD-10-CM

## 2016-11-15 DIAGNOSIS — I10 Essential (primary) hypertension: Secondary | ICD-10-CM | POA: Diagnosis not present

## 2016-11-15 DIAGNOSIS — Z0000 Encounter for general adult medical examination without abnormal findings: Secondary | ICD-10-CM | POA: Diagnosis not present

## 2016-11-15 DIAGNOSIS — E785 Hyperlipidemia, unspecified: Secondary | ICD-10-CM

## 2016-11-15 LAB — COMPREHENSIVE METABOLIC PANEL
ALT: 19 U/L (ref 0–35)
AST: 23 U/L (ref 0–37)
Albumin: 4.2 g/dL (ref 3.5–5.2)
Alkaline Phosphatase: 51 U/L (ref 39–117)
BUN: 13 mg/dL (ref 6–23)
CO2: 26 mEq/L (ref 19–32)
Calcium: 9.3 mg/dL (ref 8.4–10.5)
Chloride: 100 mEq/L (ref 96–112)
Creatinine, Ser: 0.73 mg/dL (ref 0.40–1.20)
GFR: 84.25 mL/min (ref >60.00)
Glucose, Bld: 179 mg/dL — ABNORMAL HIGH (ref 70–99)
Potassium: 4.3 mEq/L (ref 3.5–5.1)
Sodium: 134 mEq/L — ABNORMAL LOW (ref 135–145)
Total Bilirubin: 0.5 mg/dL (ref 0.2–1.2)
Total Protein: 7.1 g/dL (ref 6.0–8.3)

## 2016-11-15 LAB — CBC
HCT: 38.6 % (ref 36.0–46.0)
Hemoglobin: 13.1 g/dL (ref 12.0–15.0)
MCHC: 33.8 g/dL (ref 30.0–36.0)
MCV: 87.9 fl (ref 78.0–100.0)
Platelets: 256 10*3/uL (ref 150.0–400.0)
RBC: 4.4 Mil/uL (ref 3.87–5.11)
RDW: 14.4 % (ref 11.5–15.5)
WBC: 7.7 10*3/uL (ref 4.0–10.5)

## 2016-11-15 LAB — LIPID PANEL
Cholesterol: 126 mg/dL (ref 0–200)
HDL: 50.9 mg/dL (ref >39.00)
LDL Cholesterol: 53 mg/dL (ref 0–99)
NonHDL: 74.86
Total CHOL/HDL Ratio: 2
Triglycerides: 109 mg/dL (ref 0.0–149.0)
VLDL: 21.8 mg/dL (ref 0.0–40.0)

## 2016-11-15 LAB — HEMOGLOBIN A1C: Hgb A1c MFr Bld: 6.6 % — ABNORMAL HIGH (ref 4.6–6.5)

## 2016-11-15 LAB — TSH: TSH: 1.65 u[IU]/mL (ref 0.35–4.50)

## 2016-11-15 NOTE — Progress Notes (Signed)
   Subjective:    Patient ID: [name], female    DOB: 1951/06/12, 66 y.o.   MRN: [id number]  HPI Here for medicare wellness and physical, no new complaints. Please see A/P for status and treatment of chronic medical problems.   Diet: DM since diabetic Physical activity: sedentary Depression/mood screen: negative Hearing: intact to whispered voice Visual acuity: grossly normal, performs annual eye exam due soon ADLs: capable Fall risk: none Home safety: good Cognitive evaluation: intact to orientation, naming, recall and repetition EOL planning: adv directives discussed  I have personally reviewed and have noted 1. The patient's medical and social history - reviewed today no changes 2. Their use of alcohol, tobacco or illicit drugs 3. Their current medications and supplements 4. The patient's functional ability including ADL's, fall risks, home safety risks and hearing or visual impairment. 5. Diet and physical activities 6. Evidence for depression or mood disorders 7. Care team reviewed and updated (available in snapshot)  Review of Systems  Constitutional: Positive for activity change and fatigue. Negative for appetite change, chills and unexpected weight change.  HENT: Negative.   Eyes: Negative.   Respiratory: Negative for cough, chest tightness and shortness of breath.   Cardiovascular: Positive for palpitations. Negative for chest pain and leg swelling.  Gastrointestinal: Negative for abdominal distention, abdominal pain, constipation, diarrhea, nausea and vomiting.  Musculoskeletal: Negative.   Skin: Negative.   Neurological: Negative.   Psychiatric/Behavioral: Negative.       Objective:   Physical Exam  Constitutional: She is oriented to person, place, and time. She appears well-developed and well-nourished.  Overweight  HENT:  Head: Normocephalic and atraumatic.  Eyes: EOM are normal.  Neck: Normal range of motion.  Cardiovascular: Normal rate and regular  rhythm.   Pulmonary/Chest: Effort normal and breath sounds normal. No respiratory distress. She has no wheezes. She has no rales.  Abdominal: Soft. Bowel sounds are normal. She exhibits no distension. There is no tenderness. There is no rebound.  Musculoskeletal: She exhibits no edema.  Neurological: She is alert and oriented to person, place, and time. Coordination normal.  Skin: Skin is warm and dry.  Psychiatric: She has a normal mood and affect.   Vitals:   11/15/16 1429  BP: 138/68  Pulse: 64  Resp: 12  SpO2: 99%  Weight: 189 lb (85.7 kg)  Height: 5\' 2"  (1.575 m)      Assessment & Plan:

## 2016-11-15 NOTE — Patient Instructions (Addendum)
Health Maintenance, Female Adopting a healthy lifestyle and getting preventive care can go a long way to promote health and wellness. Talk with your health care provider about what schedule of regular examinations is right for you. This is a good chance for you to check in with your provider about disease prevention and staying healthy. In between checkups, there are plenty of things you can do on your own. Experts have done a lot of research about which lifestyle changes and preventive measures are most likely to keep you healthy. Ask your health care provider for more information. Weight and diet Eat a healthy diet  Be sure to include plenty of vegetables, fruits, low-fat dairy products, and lean protein.  Do not eat a lot of foods high in solid fats, added sugars, or salt.  Get regular exercise. This is one of the most important things you can do for your health.  Most adults should exercise for at least 150 minutes each week. The exercise should increase your heart rate and make you sweat (moderate-intensity exercise).  Most adults should also do strengthening exercises at least twice a week. This is in addition to the moderate-intensity exercise. Maintain a healthy weight  Body mass index (BMI) is a measurement that can be used to identify possible weight problems. It estimates body fat based on height and weight. Your health care provider can help determine your BMI and help you achieve or maintain a healthy weight.  For females 20 years of age and older:  A BMI below 18.5 is considered underweight.  A BMI of 18.5 to 24.9 is normal.  A BMI of 25 to 29.9 is considered overweight.  A BMI of 30 and above is considered obese. Watch levels of cholesterol and blood lipids  You should start having your blood tested for lipids and cholesterol at 68 years of age, then have this test every 5 years.  You may need to have your cholesterol levels checked more often if:  Your lipid or  cholesterol levels are high.  You are older than 68 years of age.  You are at high risk for heart disease. Cancer screening Lung Cancer  Lung cancer screening is recommended for adults 51-70 years old who are at high risk for lung cancer because of a history of smoking.  A yearly low-dose CT scan of the lungs is recommended for people who:  Currently smoke.  Have quit within the past 15 years.  Have at least a 30-pack-year history of smoking. A pack year is smoking an average of one pack of cigarettes a day for 1 year.  Yearly screening should continue until it has been 15 years since you quit.  Yearly screening should stop if you develop a health problem that would prevent you from having lung cancer treatment. Breast Cancer  Practice breast self-awareness. This means understanding how your breasts normally appear and feel.  It also means doing regular breast self-exams. Let your health care provider know about any changes, no matter how small.  If you are in your 20s or 30s, you should have a clinical breast exam (CBE) by a health care provider every 1-3 years as part of a regular health exam.  If you are 61 or older, have a CBE every year. Also consider having a breast X-ray (mammogram) every year.  If you have a family history of breast cancer, talk to your health care provider about genetic screening.  If you are at high risk for breast cancer,  talk to your health care provider about having an MRI and a mammogram every year.  Breast cancer gene (BRCA) assessment is recommended for women who have family members with BRCA-related cancers. BRCA-related cancers include:  Breast.  Ovarian.  Tubal.  Peritoneal cancers.  Results of the assessment will determine the need for genetic counseling and BRCA1 and BRCA2 testing. Cervical Cancer  Your health care provider may recommend that you be screened regularly for cancer of the pelvic organs (ovaries, uterus, and vagina).  This screening involves a pelvic examination, including checking for microscopic changes to the surface of your cervix (Pap test). You may be encouraged to have this screening done every 3 years, beginning at age 14.  For women ages 59-65, health care providers may recommend pelvic exams and Pap testing every 3 years, or they may recommend the Pap and pelvic exam, combined with testing for human papilloma virus (HPV), every 5 years. Some types of HPV increase your risk of cervical cancer. Testing for HPV may also be done on women of any age with unclear Pap test results.  Other health care providers may not recommend any screening for nonpregnant women who are considered low risk for pelvic cancer and who do not have symptoms. Ask your health care provider if a screening pelvic exam is right for you.  If you have had past treatment for cervical cancer or a condition that could lead to cancer, you need Pap tests and screening for cancer for at least 20 years after your treatment. If Pap tests have been discontinued, your risk factors (such as having a new sexual partner) need to be reassessed to determine if screening should resume. Some women have medical problems that increase the chance of getting cervical cancer. In these cases, your health care provider may recommend more frequent screening and Pap tests. Colorectal Cancer  This type of cancer can be detected and often prevented.  Routine colorectal cancer screening usually begins at 68 years of age and continues through 68 years of age.  Your health care provider may recommend screening at an earlier age if you have risk factors for colon cancer.  Your health care provider may also recommend using home test kits to check for hidden blood in the stool.  A small camera at the end of a tube can be used to examine your colon directly (sigmoidoscopy or colonoscopy). This is done to check for the earliest forms of colorectal cancer.  Routine  screening usually begins at age 38.  Direct examination of the colon should be repeated every 5-10 years through 68 years of age. However, you may need to be screened more often if early forms of precancerous polyps or small growths are found. Skin Cancer  Check your skin from head to toe regularly.  Tell your health care provider about any new moles or changes in moles, especially if there is a change in a mole's shape or color.  Also tell your health care provider if you have a mole that is larger than the size of a pencil eraser.  Always use sunscreen. Apply sunscreen liberally and repeatedly throughout the day.  Protect yourself by wearing long sleeves, pants, a wide-brimmed hat, and sunglasses whenever you are outside. Heart disease, diabetes, and high blood pressure  High blood pressure causes heart disease and increases the risk of stroke. High blood pressure is more likely to develop in:  People who have blood pressure in the high end of the normal range (130-139/85-89 mm Hg).  People who are overweight or obese.  People who are African American.  If you are 59-24 years of age, have your blood pressure checked every 3-5 years. If you are 34 years of age or older, have your blood pressure checked every year. You should have your blood pressure measured twice-once when you are at a hospital or clinic, and once when you are not at a hospital or clinic. Record the average of the two measurements. To check your blood pressure when you are not at a hospital or clinic, you can use:  An automated blood pressure machine at a pharmacy.  A home blood pressure monitor.  If you are between 29 years and 60 years old, ask your health care provider if you should take aspirin to prevent strokes.  Have regular diabetes screenings. This involves taking a blood sample to check your fasting blood sugar level.  If you are at a normal weight and have a low risk for diabetes, have this test once  every three years after 68 years of age.  If you are overweight and have a high risk for diabetes, consider being tested at a younger age or more often. Preventing infection Hepatitis B  If you have a higher risk for hepatitis B, you should be screened for this virus. You are considered at high risk for hepatitis B if:  You were born in a country where hepatitis B is common. Ask your health care provider which countries are considered high risk.  Your parents were born in a high-risk country, and you have not been immunized against hepatitis B (hepatitis B vaccine).  You have HIV or AIDS.  You use needles to inject street drugs.  You live with someone who has hepatitis B.  You have had sex with someone who has hepatitis B.  You get hemodialysis treatment.  You take certain medicines for conditions, including cancer, organ transplantation, and autoimmune conditions. Hepatitis C  Blood testing is recommended for:  Everyone born from [birth year] through 1967.  Anyone with known risk factors for hepatitis C. Sexually transmitted infections (STIs)  You should be screened for sexually transmitted infections (STIs) including gonorrhea and chlamydia if:  You are sexually active and are younger than 68 years of age.  You are older than 68 years of age and your health care provider tells you that you are at risk for this type of infection.  Your sexual activity has changed since you were last screened and you are at an increased risk for chlamydia or gonorrhea. Ask your health care provider if you are at risk.  If you do not have HIV, but are at risk, it may be recommended that you take a prescription medicine daily to prevent HIV infection. This is called pre-exposure prophylaxis (PrEP). You are considered at risk if:  You are sexually active and do not regularly use condoms or know the HIV status of your partner(s).  You take drugs by injection.  You are sexually active with a partner  who has HIV. Talk with your health care provider about whether you are at high risk of being infected with HIV. If you choose to begin PrEP, you should first be tested for HIV. You should then be tested every 3 months for as long as you are taking PrEP. Pregnancy  If you are premenopausal and you may become pregnant, ask your health care provider about preconception counseling.  If you may become pregnant, take 400 to 800 micrograms (mcg) of folic acid  every day.  If you want to prevent pregnancy, talk to your health care provider about birth control (contraception). Osteoporosis and menopause  Osteoporosis is a disease in which the bones lose minerals and strength with aging. This can result in serious bone fractures. Your risk for osteoporosis can be identified using a bone density scan.  If you are 4 years of age or older, or if you are at risk for osteoporosis and fractures, ask your health care provider if you should be screened.  Ask your health care provider whether you should take a calcium or vitamin D supplement to lower your risk for osteoporosis.  Menopause may have certain physical symptoms and risks.  Hormone replacement therapy may reduce some of these symptoms and risks. Talk to your health care provider about whether hormone replacement therapy is right for you. Follow these instructions at home:  Schedule regular health, dental, and eye exams.  Stay current with your immunizations.  Do not use any tobacco products including cigarettes, chewing tobacco, or electronic cigarettes.  If you are pregnant, do not drink alcohol.  If you are breastfeeding, limit how much and how often you drink alcohol.  Limit alcohol intake to no more than 1 drink per day for nonpregnant women. One drink equals 12 ounces of beer, 5 ounces of wine, or 1 ounces of hard liquor.  Do not use street drugs.  Do not share needles.  Ask your health care provider for help if you need support  or information about quitting drugs.  Tell your health care provider if you often feel depressed.  Tell your health care provider if you have ever been abused or do not feel safe at home. This information is not intended to replace advice given to you by your health care provider. Make sure you discuss any questions you have with your health care provider. Document Released: 02/12/2011 Document Revised: 01/05/2016 Document Reviewed: 05/03/2015 Elsevier Interactive Patient Education  2017 [employer].

## 2016-11-15 NOTE — Progress Notes (Signed)
Pre visit review using our clinic review tool, if applicable. No additional management support is needed unless otherwise documented below in the visit note. 

## 2016-11-16 DIAGNOSIS — Z0000 Encounter for general adult medical examination without abnormal findings: Secondary | ICD-10-CM | POA: Insufficient documentation

## 2016-11-16 NOTE — Assessment & Plan Note (Signed)
Recent switch to generic crestor and she is not sure it is working as well. Checking lipid panel for goal LDL <70.

## 2016-11-16 NOTE — Assessment & Plan Note (Signed)
BP at goal on diltiazem, imdur, metoprolol. Checking CMP and adjust as needed.

## 2016-11-16 NOTE — Assessment & Plan Note (Signed)
Talked about the strong need for some kind of gradual exercise program to enhance her capacity for exercise.

## 2016-11-16 NOTE — Assessment & Plan Note (Signed)
Checking HgA1c, lipid panel, CMP. Taking janumet and invokana and at goal previously. Complicated by vascular problems which are stable.

## 2016-11-16 NOTE — Assessment & Plan Note (Signed)
We talked about how her deconditioning is probably making these symptoms worse as she is especially sensitive to when her HR elevates and currently it elevates with even small activity. Advised to do cardiopulmonary rehab and she will think about if she can fit it into her schedule but has done and gotten benefit in the past.

## 2016-11-16 NOTE — Assessment & Plan Note (Signed)
Decline pneumonia, tetanus and hep c screening. Counseled about the need to get eye exam and she is scheduled already. Colonoscopy up to date. Mammogram and dexa up to date. Aged out of pap smear. Counseled about the need for exercise as well as sun safety and mole surveillance. Given 10 year screening recommendations.

## 2016-11-26 ENCOUNTER — Other Ambulatory Visit: Payer: Self-pay | Admitting: Internal Medicine

## 2016-12-05 DIAGNOSIS — M722 Plantar fascial fibromatosis: Secondary | ICD-10-CM | POA: Diagnosis not present

## 2016-12-15 ENCOUNTER — Other Ambulatory Visit: Payer: Self-pay | Admitting: Internal Medicine

## 2016-12-22 ENCOUNTER — Other Ambulatory Visit: Payer: Self-pay | Admitting: Internal Medicine

## 2016-12-22 ENCOUNTER — Other Ambulatory Visit: Payer: Self-pay | Admitting: Cardiovascular Disease

## 2016-12-30 ENCOUNTER — Other Ambulatory Visit: Payer: Self-pay | Admitting: Cardiovascular Disease

## 2017-01-14 DIAGNOSIS — Z1231 Encounter for screening mammogram for malignant neoplasm of breast: Secondary | ICD-10-CM | POA: Diagnosis not present

## 2017-01-14 LAB — HM MAMMOGRAPHY

## 2017-01-18 ENCOUNTER — Other Ambulatory Visit: Payer: Self-pay | Admitting: Internal Medicine

## 2017-02-01 ENCOUNTER — Other Ambulatory Visit: Payer: Self-pay | Admitting: Internal Medicine

## 2017-02-02 ENCOUNTER — Telehealth: Payer: Self-pay | Admitting: Internal Medicine

## 2017-02-02 NOTE — Telephone Encounter (Signed)
Patient took extra 150 mg dose of metoprolol which she was supposed to take in the morning tonight. She was told to skip the morning dose. She was told to check her heart rate and was told that if she develop any symptoms she should call 911. Otherwise, she can just monitor herself for now.

## 2017-02-15 DIAGNOSIS — E119 Type 2 diabetes mellitus without complications: Secondary | ICD-10-CM | POA: Diagnosis not present

## 2017-02-15 DIAGNOSIS — H401131 Primary open-angle glaucoma, bilateral, mild stage: Secondary | ICD-10-CM | POA: Diagnosis not present

## 2017-02-15 DIAGNOSIS — H25813 Combined forms of age-related cataract, bilateral: Secondary | ICD-10-CM | POA: Diagnosis not present

## 2017-02-15 DIAGNOSIS — H5202 Hypermetropia, left eye: Secondary | ICD-10-CM | POA: Diagnosis not present

## 2017-02-15 LAB — HM DIABETES EYE EXAM

## 2017-02-18 ENCOUNTER — Encounter: Payer: Self-pay | Admitting: Internal Medicine

## 2017-02-18 NOTE — Progress Notes (Unsigned)
Results entered and sent to scan  

## 2017-03-23 ENCOUNTER — Other Ambulatory Visit: Payer: Self-pay | Admitting: Internal Medicine

## 2017-03-25 NOTE — Telephone Encounter (Signed)
Please advise per Dr. Crawfords absence. thanks 

## 2017-04-16 ENCOUNTER — Other Ambulatory Visit: Payer: Self-pay | Admitting: Cardiovascular Disease

## 2017-04-16 NOTE — Telephone Encounter (Signed)
REFILL 

## 2017-04-23 ENCOUNTER — Encounter: Payer: Self-pay | Admitting: Internal Medicine

## 2017-04-23 ENCOUNTER — Ambulatory Visit (INDEPENDENT_AMBULATORY_CARE_PROVIDER_SITE_OTHER): Payer: PPO | Admitting: Internal Medicine

## 2017-04-23 ENCOUNTER — Other Ambulatory Visit (INDEPENDENT_AMBULATORY_CARE_PROVIDER_SITE_OTHER): Payer: PPO

## 2017-04-23 VITALS — BP 122/70 | HR 69 | Temp 97.8°F | Ht 62.0 in | Wt 192.0 lb

## 2017-04-23 DIAGNOSIS — E1142 Type 2 diabetes mellitus with diabetic polyneuropathy: Secondary | ICD-10-CM

## 2017-04-23 DIAGNOSIS — D649 Anemia, unspecified: Secondary | ICD-10-CM

## 2017-04-23 DIAGNOSIS — Z23 Encounter for immunization: Secondary | ICD-10-CM

## 2017-04-23 DIAGNOSIS — G2581 Restless legs syndrome: Secondary | ICD-10-CM | POA: Insufficient documentation

## 2017-04-23 DIAGNOSIS — E1159 Type 2 diabetes mellitus with other circulatory complications: Secondary | ICD-10-CM

## 2017-04-23 DIAGNOSIS — E785 Hyperlipidemia, unspecified: Secondary | ICD-10-CM | POA: Diagnosis not present

## 2017-04-23 LAB — LIPID PANEL
Cholesterol: 113 mg/dL (ref 0–200)
HDL: 45.6 mg/dL (ref >39.00)
LDL Cholesterol: 45 mg/dL (ref 0–99)
NonHDL: 67.88
Total CHOL/HDL Ratio: 2
Triglycerides: 112 mg/dL (ref 0.0–149.0)
VLDL: 22.4 mg/dL (ref 0.0–40.0)

## 2017-04-23 LAB — COMPREHENSIVE METABOLIC PANEL
ALT: 16 U/L (ref 0–35)
AST: 20 U/L (ref 0–37)
Albumin: 4.1 g/dL (ref 3.5–5.2)
Alkaline Phosphatase: 48 U/L (ref 39–117)
BUN: 16 mg/dL (ref 6–23)
CO2: 29 mEq/L (ref 19–32)
Calcium: 9.5 mg/dL (ref 8.4–10.5)
Chloride: 98 mEq/L (ref 96–112)
Creatinine, Ser: 0.74 mg/dL (ref 0.40–1.20)
GFR: 82.83 mL/min (ref >60.00)
Glucose, Bld: 191 mg/dL — ABNORMAL HIGH (ref 70–99)
Potassium: 4.5 mEq/L (ref 3.5–5.1)
Sodium: 133 mEq/L — ABNORMAL LOW (ref 135–145)
Total Bilirubin: 0.4 mg/dL (ref 0.2–1.2)
Total Protein: 7 g/dL (ref 6.0–8.3)

## 2017-04-23 LAB — CBC
HCT: 36.1 % (ref 36.0–46.0)
Hemoglobin: 11.8 g/dL — ABNORMAL LOW (ref 12.0–15.0)
MCHC: 32.6 g/dL (ref 30.0–36.0)
MCV: 85.3 fl (ref 78.0–100.0)
Platelets: 272 10*3/uL (ref 150.0–400.0)
RBC: 4.23 Mil/uL (ref 3.87–5.11)
RDW: 15 % (ref 11.5–15.5)
WBC: 10.5 10*3/uL (ref 4.0–10.5)

## 2017-04-23 LAB — TSH: TSH: 2.27 u[IU]/mL (ref 0.35–4.50)

## 2017-04-23 LAB — FERRITIN: Ferritin: 15.9 ng/mL (ref 10.0–291.0)

## 2017-04-23 LAB — VITAMIN B12: Vitamin B-12: 757 pg/mL (ref 211–911)

## 2017-04-23 LAB — HEMOGLOBIN A1C: Hgb A1c MFr Bld: 6.4 % (ref 4.6–6.5)

## 2017-04-23 LAB — VITAMIN D 25 HYDROXY (VIT D DEFICIENCY, FRACTURES): VITD: 33.8 ng/mL (ref 30.00–100.00)

## 2017-04-23 MED ORDER — ROPINIROLE HCL 0.5 MG PO TABS: 0.5000 mg | ORAL_TABLET | Freq: Every day | ORAL | 0 refills | Status: DC

## 2017-04-23 MED ORDER — FREESTYLE LIBRE SENSOR SYSTEM MISC: 0 refills | Status: DC

## 2017-04-23 NOTE — Assessment & Plan Note (Addendum)
New problem, checking ferritin and CBC and B12 to exclude nutritional deficit causing worsening. Rx for requip to try prn at night time for pain if severe.

## 2017-04-23 NOTE — Patient Instructions (Signed)
We have given you the prescription for the blood sugar monitor.   We will check the blood work.   We have sent in requip to use for the restless legs if needed.

## 2017-04-23 NOTE — Assessment & Plan Note (Signed)
Checking HgA1c, rx for freestyle libre given to her at visit. Foot exam done. Reminded about yearly eye exam. Well controlled on her janumet and invokana at last visit. Complicated by vascular disease and neuropathy (which is worsening since last visit). Adjust as needed.

## 2017-04-23 NOTE — Assessment & Plan Note (Signed)
Taking crestor 10 mg daily, checking lipid panel and adjust for LDL <100.

## 2017-04-23 NOTE — Assessment & Plan Note (Signed)
Worsening in the last 6 months and still taking gabapentin and does not want increase in dosing. Wants to see if we can treat better to stop.

## 2017-04-23 NOTE — Progress Notes (Signed)
   Subjective:    Patient ID: [name], female    DOB: 08/30/1950, 67 y.o.   MRN: [id number]  HPI The patient is a 66 YO female coming in for follow up of her diabetes (she is complicated by vascular disease and neuropathy, the neuropathy is worsening since last visit). She is still taking her invokana and janumet. Taking gabapentin for neuropathy. She is on ACE-I and statin. Denies change in diet or exercise. Sugars are still running well. She is wondering if she can get the sugar meter which goes on her arm and she would not have to prick finger to check. She also needs follow up of her cholesterol (she is not exercising lately and is worried that it is doing worse, still taking her crestor, denies new CAD or PVD problems, no side effects).  She is also coming in for new problem of restless leg syndrome (has had for some time very mild and never brought to our attention, now worse episode lately, 2-3 episodes in the last month which were bothersome to her, she is concerned about why they are worsening, wants to try requip prn).   Review of Systems  Constitutional: Positive for activity change. Negative for appetite change, diaphoresis, fever and unexpected weight change.  HENT: Negative.   Eyes: Negative.   Respiratory: Negative for cough, chest tightness and shortness of breath.   Cardiovascular: Negative for chest pain, palpitations and leg swelling.  Gastrointestinal: Negative for abdominal distention, abdominal pain, constipation, diarrhea, nausea and vomiting.  Musculoskeletal: Positive for arthralgias and myalgias.  Skin: Negative.   Neurological: Positive for numbness. Negative for dizziness, facial asymmetry, weakness, light-headedness and headaches.  Psychiatric/Behavioral: Negative.       Objective:   Physical Exam  Constitutional: She is oriented to person, place, and time. She appears well-developed and well-nourished.  Obese  HENT:  Head: Normocephalic and atraumatic.    Eyes: EOM are normal.  Neck: Normal range of motion.  Cardiovascular: Normal rate.   Pulmonary/Chest: Effort normal and breath sounds normal. No respiratory distress. She has no wheezes. She has no rales.  Abdominal: Soft. Bowel sounds are normal. She exhibits no distension. There is no tenderness. There is no rebound.  Musculoskeletal: She exhibits no edema.  Neurological: She is alert and oriented to person, place, and time. Coordination normal.  Skin: Skin is warm and dry.  See foot exam  Psychiatric: She has a normal mood and affect.   Vitals:   04/23/17 1357  BP: 122/70  Pulse: 69  Temp: 97.8 F (36.6 C)  TempSrc: Oral  SpO2: 98%  Weight: 192 lb (87.1 kg)  Height: 5\' 2"  (1.575 m)      Assessment & Plan:  Flu shot given at visit

## 2017-04-23 NOTE — Assessment & Plan Note (Signed)
Worsening RLS could be an indicator of worsening anemia and iron deficiency. Checking ferritin, B12, CBC and adjust as needed. On NOAC and previous colon polyps as blood loss source.

## 2017-04-24 ENCOUNTER — Other Ambulatory Visit: Payer: Self-pay | Admitting: Family

## 2017-04-24 ENCOUNTER — Ambulatory Visit (HOSPITAL_COMMUNITY): Payer: PPO | Admitting: Nurse Practitioner

## 2017-04-25 NOTE — Telephone Encounter (Signed)
Faxed to [facility] 

## 2017-05-03 ENCOUNTER — Ambulatory Visit (INDEPENDENT_AMBULATORY_CARE_PROVIDER_SITE_OTHER): Payer: PPO | Admitting: Physician Assistant

## 2017-05-03 ENCOUNTER — Encounter: Payer: Self-pay | Admitting: Physician Assistant

## 2017-05-03 VITALS — BP 126/58 | HR 99 | Ht 62.0 in | Wt 193.0 lb

## 2017-05-03 DIAGNOSIS — F410 Panic disorder [episodic paroxysmal anxiety] without agoraphobia: Secondary | ICD-10-CM

## 2017-05-03 DIAGNOSIS — I2583 Coronary atherosclerosis due to lipid rich plaque: Secondary | ICD-10-CM | POA: Diagnosis not present

## 2017-05-03 DIAGNOSIS — I4819 Other persistent atrial fibrillation: Secondary | ICD-10-CM

## 2017-05-03 DIAGNOSIS — I2510 Atherosclerotic heart disease of native coronary artery without angina pectoris: Secondary | ICD-10-CM

## 2017-05-03 DIAGNOSIS — I481 Persistent atrial fibrillation: Secondary | ICD-10-CM | POA: Diagnosis not present

## 2017-05-03 NOTE — Patient Instructions (Signed)
Medication Instructions:  NO CHANGES If you need a refill on your cardiac medications before your next appointment, please call your pharmacy.  Follow-Up: Your physician wants you to follow-up in: 2 [place].   MAKE SURE YOU KEEP SCHEDULED APPOINTMENT WITH DONNA CARROLL 05-08-2017 AT 130PM  Special Instructions: GET ALIVECOR  CALL AND LET RHONDA KNOW IF YOUR CHEST PAIN AND SHORTNESS OF BREATH CHANGES IN ANY WAY  MAKE SURE TO SCHEDULE SLEEP STUDY  Thank you for choosing [facility] at [facility]!!

## 2017-05-03 NOTE — Progress Notes (Signed)
Cardiology Office Note +  Date:  05/03/2017   ID:  [name], DOB [birth year], MRN [id number]  PCP:  Hoyt Koch, MD  Cardiologist:  Dr. Gwenlyn Morrison, 11/29/2015 Roderic Palau, NP, 11/14/2016 Rosaria Ferries, PA-C    History of Present Illness: [name] is a 66 y.o. female with a history of DES PLA 2009, MV 2014 ok, DM, HTN, obesity, PAF on BB and Xarelto with rate control, COPD  [name] presents for cardiology follow up.  She stays in afib. She does not feel well in afib. When her HR gets past 90, she feels even worse, nausea and headaches. She cannot remember when she was last not in afib.   She has panic attacks, is on Paxil for them. It works well. However, because of the Paxil, she cannot take most antiarrhythmics.   She has not had a study for sleep apnea. She broke her foot and that slowed her down for a while. She admits that the sleep apnea may be worsening her atrial fibrillation.  When she exerts herself, she gets SOB and leg pain in her muscles. She also gets chest tightness with exertion, is not sure if it is consistent or not. She never takes nitro for it. It goes away when she stops within 5 minutes. She will get nauseated with the afib but is not sure if she gets nauseated with the chest pain.    Past Medical History:  Diagnosis Date  . Anemia, unspecified   . Arthritis   . Asthma   . Atrial fibrillation with RVR ([place])    a. on Xarelto  . Bell's palsy    Facial nerve decompression in 2001  . Chronic low back pain   . COPD with asthma ([place])   . Coronary artery disease    Myoview 04/12/11 was entirely normal. ECHO 02/26/08 showed only minor abnormalities. Stenting 05/26/08 of her posterolateral branch to the left circumflex coronary artery. Used a 2.5x63mm Taxus Monorail stent.myoview 2014 was without ischemia  . Diabetes mellitus    Type 2  . GERD (gastroesophageal reflux disease)   . Glaucoma   . Goiter   . Heart murmur   . History of  nuclear stress test 2012; 2014   lexiscan; normal pattern of perfusion; normal, low risk scan   . Hyperlipidemia   . Hypertension   . Pneumonia 2008  . Polycystic ovary disease    Hysterectomy in 1982 for this  . Shortness of breath dyspnea    ECHO 02/26/08 showed only minor abnormalities  . Spinal stenosis     Past Surgical History:  Procedure Laterality Date  . ABDOMINAL HYSTERECTOMY  1982   & BSO; for polycystic ovary disease  . CARDIOVERSION N/A 12/17/2013   Procedure: CARDIOVERSION;  Surgeon: Pixie Casino, MD;  Location: [facility] ENDOSCOPY;  Service: Cardiovascular;  Laterality: N/A;  . CARDIOVERSION N/A 09/30/2015   Procedure: CARDIOVERSION;  Surgeon: Dorothy Spark, MD;  Location: [facility] ENDOSCOPY;  Service: Cardiovascular;  Laterality: N/A;  . CARDIOVERSION N/A 06/22/2016   Procedure: CARDIOVERSION;  Surgeon: Skeet Latch, MD;  Location: [place];  Service: Cardiovascular;  Laterality: N/A;  . COLONOSCOPY     last 2009; Dr Cristina Gong; due 2019  . CORONARY ANGIOPLASTY  05/26/2008   Stenting of her posterolateral branch to the left circumflex coronary artery. Used a 2.5x61mm Taxus Monorail stent.  [name] FACIAL NERVE DECOMPRESSION  2001/2002   bells palsy   . LAPAROSCOPIC CHOLECYSTECTOMY  06/15/2011  Dr Dalbert Batman  . TEE WITHOUT CARDIOVERSION N/A 12/17/2013   Procedure: TRANSESOPHAGEAL ECHOCARDIOGRAM (TEE);  Surgeon: Pixie Casino, MD;  Location: [facility] ENDOSCOPY;  Service: Cardiovascular;  Laterality: N/A;  trish/ja  . TRANSTHORACIC ECHOCARDIOGRAM  5/39/7673   LV systolic function normal with mild conc LVH; LA mildly dilated; trace MR/TR  . UPPER GI ENDOSCOPY  2009   negative    Current Outpatient Prescriptions  Medication Sig Dispense Refill  . ascorbic acid (VITAMIN C) 1000 MG tablet Take 1,000 mg by mouth 2 (two) times daily.     . budesonide-formoterol (SYMBICORT) 160-4.5 MCG/ACT inhaler USE 2 PUFFS TWICE DAILY 10.2 g 0  . CALCIUM-MAG-VIT C-VIT D PO Take 2 tablets by mouth  daily.     . celecoxib (CELEBREX) 200 MG capsule Take 200 mg by mouth 2 (two) times daily.     . Continuous Blood Gluc Sensor (FREESTYLE LIBRE SENSOR SYSTEM) MISC Use to monitor sugars 1 each 0  . diltiazem (CARDIZEM CD) 180 MG 24 hr capsule TAKE 1 CAPSULE DAILY. 90 capsule 1  . estradiol (ESTRACE) 0.5 MG tablet Take 0.5 mg by mouth daily.     [name] gabapentin (NEURONTIN) 100 MG capsule TAKE (1) CAPSULE THREE TIMES DAILY. 180 capsule 2  . guaiFENesin (MUCINEX) 600 MG 12 hr tablet Take 600 mg by mouth 2 (two) times daily.    [name] HYDROcodone-acetaminophen (NORCO/VICODIN) 5-325 MG tablet   0  . INVOKANA 100 MG TABS tablet TAKE 1 TABLET ONCE DAILY. 30 tablet 2  . isosorbide mononitrate (IMDUR) 30 MG 24 hr tablet TAKE 1 TABLET ONCE DAILY. 30 tablet 11  . JANUMET 50-1000 MG tablet TAKE 1/2 TABLET IN THE MORNING WITH BREAKFAST AND 1 TABLET IN THE EVENING WITH SUPPER. 140 tablet 1  . latanoprost (XALATAN) 0.005 % ophthalmic solution Place 1 drop into both eyes at bedtime.    . levalbuterol (XOPENEX) 0.63 MG/3ML nebulizer solution Take 3 mLs (0.63 mg total) by nebulization every 6 (six) hours as needed for wheezing or shortness of breath. 75 mL 1  . LORazepam (ATIVAN) 0.5 MG tablet TAKE 2 TABLETS AT BEDTIME. 60 tablet 0  . Melatonin 5 MG TABS Take 1 tablet by mouth at bedtime.     . metoprolol succinate (TOPROL-XL) 50 MG 24 hr tablet TAKE 3 TABLETS ONCE DAILY WITH FOOD. 90 tablet 1  . Multiple Vitamins-Iron (MULTIVITAMINS WITH IRON) TABS Take 1 tablet by mouth 2 (two) times daily.     [name] nystatin ointment (MYCOSTATIN)   0  . omega-3 acid ethyl esters (LOVAZA) 1 G capsule Take 1 g by mouth daily.     [name] omeprazole (PRILOSEC) 20 MG capsule Take 20 mg by mouth daily.     Glory Rosebush DELICA LANCETS FINE MISC 1 Units by Does not apply route as directed. 100 each 3  . PARoxetine (PAXIL) 10 MG tablet Take 2.5 tablets (25 mg total) by mouth daily. 225 tablet 1  . quinapril (ACCUPRIL) 40 MG tablet TAKE 1 TABLET ONCE  DAILY. 90 tablet 3  . rivaroxaban (XARELTO) 20 MG TABS tablet Take 1 tablet (20 mg total) by mouth daily with supper. 90 tablet 1  . rOPINIRole (REQUIP) 0.5 MG tablet Take 1 tablet (0.5 mg total) by mouth at bedtime. 30 tablet 0  . rosuvastatin (CRESTOR) 10 MG tablet Take 1 tablet (10 mg total) by mouth at bedtime. 30 tablet 12  . senna (SENOKOT) 8.6 MG TABS Take 1 tablet by mouth daily.     Penne Lash HFA  45 MCG/ACT inhaler USE 1 TO 2 PUFFS EVERY 6 HOURS AS NEEDED FOR WHEEZING. 15 g 0   No current facility-administered medications for this visit.     Allergies:   Benadryl [diphenhydramine hcl]; Clopidogrel bisulfate; and Contrast media [iodinated diagnostic agents]    Social History:  The patient  reports that she quit smoking about 23 years ago. Her smoking use included Cigarettes. She has a 45.00 pack-year smoking history. She has never used smokeless tobacco. She reports that she does not drink alcohol or use drugs.   Family History:  The patient's family history includes Cancer in her brother and sister; Colon cancer (age of onset: 53) in her brother; Colon polyps in her sister; Emphysema (age of onset: 80) in her mother; Gout in her brother; Heart attack (age of onset: 40) in her father; Hyperlipidemia in her brother; Hypertension in her brother; Pneumonia in her maternal grandmother; Ulcers in her mother.    ROS:  Please see the history of present illness. All other systems are reviewed and negative.    PHYSICAL EXAM: VS:  BP (!) 126/58   Pulse 99   Ht 5\' 2"  (1.575 m)   Wt 193 lb (87.5 kg)   SpO2 94%   BMI 35.30 kg/m  , BMI Body mass index is 35.3 kg/m. GEN: Well nourished, well developed, female in no acute distress  HEENT: normal for age  Neck: no JVD, no carotid bruit, no masses Cardiac: Irregular rate And rhythm; Soft murmur, no rubs, or gallops Respiratory:  clear to auscultation bilaterally, normal work of breathing GI: soft, nontender, nondistended, + BS MS: no  deformity or atrophy; no edema; distal pulses are 2+ in all 4 extremities   Skin: warm and dry, no rash Neuro:  Strength and sensation are intact Psych: euthymic mood, full affect   EKG:  EKG is not ordered today.  CATH: 04/20/2011 SELECTIVE CORONARY ANGIOGRAPHY: 1. Left main:  Left main had 30% ostial stenosis. 2. Left ascending descending:  The LAD was free of significant     disease. 3. Left circumflex:  Left circumflex was dominant.  PLA branch had     widely patent stent.  The PDA had almost 50% segmental stenosis. 4. Right coronary:  Nondominant with 50-60% stenosis in the     midportion, unchanged from prior cath. 5. Left ventriculography:  RAO left ventriculogram was performed using     25 mL of Visipaque dye at 12 mL per second.  The overall LVEF     estimated greater than 60% without focal wall motion abnormalities. IMPRESSION:  Ms. Penza has noncritical coronary artery disease with normal left ventricular function.  I do not think her chest pain is cardiac in nature.   Recent Labs: 04/23/2017: ALT 16; BUN 16; Creatinine, Ser 0.74; Hemoglobin 11.8; Platelets 272.0; Potassium 4.5; Sodium 133; TSH 2.27    Lipid Panel    Component Value Date/Time   CHOL 113 04/23/2017 1429   TRIG 112.0 04/23/2017 1429   HDL 45.60 04/23/2017 1429   CHOLHDL 2 04/23/2017 1429   VLDL 22.4 04/23/2017 1429   LDLCALC 45 04/23/2017 1429   LDLDIRECT 143.4 01/23/2008 1316     Wt Readings from Last 3 Encounters:  05/03/17 193 lb (87.5 kg)  04/23/17 192 lb (87.1 kg)  11/15/16 189 lb (85.7 kg)     Other studies Reviewed: Additional studies/ records that were reviewed today include: Office notes, hospital records and testing.  ASSESSMENT AND PLAN:  1.  CAD: She is  not on aspirin because of the Xarelto. She is on a beta blocker, and Imdur. She is also on an ACE inhibitor. Her blood pressure is well controlled. She is not having any clearly ischemic symptoms. No ischemic testing is  indicated at this time. The chest tightness that she has with exertion she has been having for a long time. In the past, it has been felt secondary to the atrial fibrillation. I offered to discuss with Dr. Gwenlyn Morrison if she needs a stress test or heart catheterization. She wishes to hold off for now. If she becomes more concerned about her symptoms, she will let us know.   2. Atrial fibrillation: She is on a high dose of beta blocker taking 75 mg of metoprolol in the morning and 20 5 in the evening. She is also taking Cardizem CD 180 mg daily. I offered to increase the Cardizem until she can get back to the A. fib clinic to see if that would help with rate control. She doesn't feel too bad when her heart rate is better controlled, but feels very poorly when it is elevated. She does not currently wish to increase her Cardizem. She is encouraged to keep the follow-up appointment with the A. fib clinic.  3. Panic attacks: She has not been tried on several antiarrhythmics because they have the potential to interact with her Paxil and caused prolonged QT. She will consider discussing with her physician if she might benefit from a different medication for her panic attacks.    Current medicines are reviewed at length with the patient today.  The patient does not have concerns regarding medicines.  The following changes have been made:  no change  Labs/ tests ordered today include:  No orders of the defined types were placed in this encounter.    Disposition:   FU with Dr. Gwenlyn Morrison  Signed, Charnelle Bergeman, [address]  05/03/2017 2:55 PM    [place] Phone: [phone]; Fax: [phone]  This note was written with the assistance of speech recognition software. Please excuse any transcriptional errors.

## 2017-05-07 ENCOUNTER — Encounter: Payer: Self-pay | Admitting: Internal Medicine

## 2017-05-08 ENCOUNTER — Encounter (HOSPITAL_COMMUNITY): Payer: Self-pay | Admitting: Nurse Practitioner

## 2017-05-08 ENCOUNTER — Ambulatory Visit (HOSPITAL_COMMUNITY)
Admission: RE | Admit: 2017-05-08 | Discharge: 2017-05-08 | Disposition: A | Discharge status: Home / Self Care | Payer: PPO | Source: Ambulatory Visit | Attending: Nurse Practitioner | Admitting: Nurse Practitioner

## 2017-05-08 VITALS — BP 126/64 | HR 82 | Ht 62.0 in | Wt 193.4 lb

## 2017-05-08 DIAGNOSIS — E119 Type 2 diabetes mellitus without complications: Secondary | ICD-10-CM | POA: Diagnosis not present

## 2017-05-08 DIAGNOSIS — I481 Persistent atrial fibrillation: Secondary | ICD-10-CM | POA: Diagnosis not present

## 2017-05-08 DIAGNOSIS — E669 Obesity, unspecified: Secondary | ICD-10-CM | POA: Insufficient documentation

## 2017-05-08 DIAGNOSIS — Z7901 Long term (current) use of anticoagulants: Secondary | ICD-10-CM | POA: Insufficient documentation

## 2017-05-08 DIAGNOSIS — Z8371 Family history of colonic polyps: Secondary | ICD-10-CM | POA: Diagnosis not present

## 2017-05-08 DIAGNOSIS — I480 Paroxysmal atrial fibrillation: Secondary | ICD-10-CM | POA: Insufficient documentation

## 2017-05-08 DIAGNOSIS — I2510 Atherosclerotic heart disease of native coronary artery without angina pectoris: Secondary | ICD-10-CM | POA: Insufficient documentation

## 2017-05-08 DIAGNOSIS — M545 Low back pain: Secondary | ICD-10-CM | POA: Insufficient documentation

## 2017-05-08 DIAGNOSIS — H409 Unspecified glaucoma: Secondary | ICD-10-CM | POA: Insufficient documentation

## 2017-05-08 DIAGNOSIS — M4800 Spinal stenosis, site unspecified: Secondary | ICD-10-CM | POA: Diagnosis not present

## 2017-05-08 DIAGNOSIS — G8929 Other chronic pain: Secondary | ICD-10-CM | POA: Diagnosis not present

## 2017-05-08 DIAGNOSIS — K219 Gastro-esophageal reflux disease without esophagitis: Secondary | ICD-10-CM | POA: Insufficient documentation

## 2017-05-08 DIAGNOSIS — I4819 Other persistent atrial fibrillation: Secondary | ICD-10-CM

## 2017-05-08 DIAGNOSIS — J449 Chronic obstructive pulmonary disease, unspecified: Secondary | ICD-10-CM | POA: Insufficient documentation

## 2017-05-08 DIAGNOSIS — I10 Essential (primary) hypertension: Secondary | ICD-10-CM | POA: Insufficient documentation

## 2017-05-08 DIAGNOSIS — Z955 Presence of coronary angioplasty implant and graft: Secondary | ICD-10-CM | POA: Diagnosis not present

## 2017-05-08 DIAGNOSIS — Z87891 Personal history of nicotine dependence: Secondary | ICD-10-CM | POA: Insufficient documentation

## 2017-05-08 NOTE — Progress Notes (Signed)
Patient ID: [name], female   DOB: Apr 03, 1951, 66 y.o.   MRN: [id number]     Primary Care Physician: Hoyt Koch, MD Referring Physician: Dr. Ronald Lobo [name] is a 66 y.o. female with a h/o CAD, DM, HTN, obesity, PAF first diagnosed May of 2015 with successful DCCV x2, first in May 2015.Marland KitchenShe recently had an AF episode and metoprolol succinate was increased to 150 mg qd. She converted on this dose and has been maintaining SR.  No previous AAD. Chads vasc score of at least 5, on xarelto daily.   On last visit in the afib clinic, we discussed lifestyle triggers. Pt does not  smoke or drink alcohol.Her BP/Hgba1c is controlled. She is sedentary. She does snore, no previous sleep study. Sleep study is pending. We also discussed antiarrythmic, which she was not interested in at that time.  She saw Dr. Gwenlyn Found and was in afib. Amlodipine was stopped and cardizem was started. She converted within the day. Discussed AAD therapy  again, but she is still not interested since she is back in rhythm and would prefer to take a wait and see approach.  She is in the afib clinic 09/26/15 with c/o being in afib x 2 weeks. Symptomatic with fatigue and dyspnea with exertion. We discussed options to restore SR. She is not interested in antiarrythmic's at this time, would like a cardioversion. Last one held her in rhythm since 5/16. If she has ERAF, she will consider AAD.  She returns, 11/26/15, in [place] but is surprised her energy is not much better in SR. She has a mild cough, but mostly fatigue. She has a sleep study, pending. She had a stress test last summer which was low risk. Has noticed some increase in BP.  Pt was being seen again, 06/2016, after some months with c/o afib for several weeks. She  has fatigue with afib. Again, options were discussed and she is still very resultant to start antiarrythmic's. She would not be a candidate for  1C agents. She is not interested in being hospitalized for a  med and is concerned re cost of multaq/tiksoyn. Side effects of amiodarone make her hesitatnt to use this drug, plus she is on the young side for this. Decided on cardioversion.  F/u in afib clinic s/p cardioversion, 06/22/16.She unfortunately has had ERAF. She felt great yesterday but was unaware that she was in afib today. Rate controlled but with v rate of upper 90's and could benefit from more rate control. Again, options were   discussed and she is not candidate for 1c agents, does not want to stop Paxil to allow for tikosyn to be used. She is pending echo 12/1 and a sleep study. Multaq may be an option in conjunction with another cardioversion at a later date.   F/u in afib clinic 07/18/16. She continues to be in rate controlled afib. She visited her family in [place] over [place],and fell and fx her foot which is in a boot. We discussed antiarrythmic's as well and she still can not decide on a good option. She is not willing to come off antidepressant which would be an issue with qtc prolonging drugs such as multaq, sotalol, tikosyn. She had to delay her sleep study until January due to foot fx. Echo updated and stable.  F/u in the afib clinic 4/4. She continues in rate controlled afib. We have discussed antiarrythmic's but she does not like any of the options given. She feels that she  is tolerating the afib OK. Continues on xarelto without bleeding issues. Saw Kerin Ransom for general cardiology concerns/CAD and thought to be stable. Pt states that she has felt bad for a couple of weeks but thought it was due to chest congestion. Feeling better today.  F/u in afib clinic, 05/08/17. She had a good summer. She continues in afib, rate controlled. Again, options to restore SR discussed and she wants to stay as she is. As long as she paces herself, she feels that her quality of life  is good.  Today, she denies symptoms of palpitations, chest pain, shortness of breath, orthopnea, PND, lower extremity  edema, dizziness, presyncope, syncope, or neurologic sequela. The patient is tolerating medications without difficulties and is otherwise without complaint today.   Past Medical History:  Diagnosis Date  . Anemia, unspecified   . Arthritis   . Asthma   . Atrial fibrillation with RVR ([place])    a. on Xarelto  . Bell's palsy    Facial nerve decompression in 2001  . Chronic low back pain   . COPD with asthma ([place])   . Coronary artery disease    Myoview 04/12/11 was entirely normal. ECHO 02/26/08 showed only minor abnormalities. Stenting 05/26/08 of her posterolateral branch to the left circumflex coronary artery. Used a 2.5x80mm Taxus Monorail stent.myoview 2014 was without ischemia  . Diabetes mellitus    Type 2  . GERD (gastroesophageal reflux disease)   . Glaucoma   . Goiter   . Heart murmur   . History of nuclear stress test 2012; 2014   lexiscan; normal pattern of perfusion; normal, low risk scan   . Hyperlipidemia   . Hypertension   . Pneumonia 2008  . Polycystic ovary disease    Hysterectomy in 1982 for this  . Shortness of breath dyspnea    ECHO 02/26/08 showed only minor abnormalities  . Spinal stenosis    Past Surgical History:  Procedure Laterality Date  . ABDOMINAL HYSTERECTOMY  1982   & BSO; for polycystic ovary disease  . CARDIOVERSION N/A 12/17/2013   Procedure: CARDIOVERSION;  Surgeon: Pixie Casino, MD;  Location: [facility] ENDOSCOPY;  Service: Cardiovascular;  Laterality: N/A;  . CARDIOVERSION N/A 09/30/2015   Procedure: CARDIOVERSION;  Surgeon: Dorothy Spark, MD;  Location: [facility] ENDOSCOPY;  Service: Cardiovascular;  Laterality: N/A;  . CARDIOVERSION N/A 06/22/2016   Procedure: CARDIOVERSION;  Surgeon: Skeet Latch, MD;  Location: [facility];  Service: Cardiovascular;  Laterality: N/A;  . COLONOSCOPY     last 2009; Dr Cristina Gong; due 2019  . CORONARY ANGIOPLASTY  05/26/2008   Stenting of her posterolateral branch to the left circumflex coronary artery. Used a  2.5x110mm Taxus Monorail stent.  [name] FACIAL NERVE DECOMPRESSION  2001/2002   bells palsy   . LAPAROSCOPIC CHOLECYSTECTOMY  06/15/2011    Dr Dalbert Batman  . TEE WITHOUT CARDIOVERSION N/A 12/17/2013   Procedure: TRANSESOPHAGEAL ECHOCARDIOGRAM (TEE);  Surgeon: Pixie Casino, MD;  Location: [facility] ENDOSCOPY;  Service: Cardiovascular;  Laterality: N/A;  trish/ja  . TRANSTHORACIC ECHOCARDIOGRAM  1/61/0960   LV systolic function normal with mild conc LVH; LA mildly dilated; trace MR/TR  . UPPER GI ENDOSCOPY  2009   negative    Current Outpatient Prescriptions  Medication Sig Dispense Refill  . ascorbic acid (VITAMIN C) 1000 MG tablet Take 1,000 mg by mouth 2 (two) times daily.     . budesonide-formoterol (SYMBICORT) 160-4.5 MCG/ACT inhaler USE 2 PUFFS TWICE DAILY 10.2 g 0  . CALCIUM-MAG-VIT C-VIT D PO  Take 2 tablets by mouth daily.     . celecoxib (CELEBREX) 200 MG capsule Take 200 mg by mouth 2 (two) times daily.     . Continuous Blood Gluc Sensor (FREESTYLE LIBRE SENSOR SYSTEM) MISC Use to monitor sugars 1 each 0  . diltiazem (CARDIZEM CD) 180 MG 24 hr capsule TAKE 1 CAPSULE DAILY. 90 capsule 1  . estradiol (ESTRACE) 0.5 MG tablet Take 0.5 mg by mouth daily.     . ferrous gluconate (IRON 27) 240 (27 FE) MG tablet Take 240 mg by mouth 3 (three) times daily with meals.    . gabapentin (NEURONTIN) 100 MG capsule TAKE (1) CAPSULE THREE TIMES DAILY. (Patient taking differently: bid) 180 capsule 2  . guaiFENesin (MUCINEX) 600 MG 12 hr tablet Take 600 mg by mouth 2 (two) times daily.    [name] HYDROcodone-acetaminophen (NORCO/VICODIN) 5-325 MG tablet   0  . INVOKANA 100 MG TABS tablet TAKE 1 TABLET ONCE DAILY. 30 tablet 2  . isosorbide mononitrate (IMDUR) 30 MG 24 hr tablet TAKE 1 TABLET ONCE DAILY. 30 tablet 11  . JANUMET 50-1000 MG tablet TAKE 1/2 TABLET IN THE MORNING WITH BREAKFAST AND 1 TABLET IN THE EVENING WITH SUPPER. 140 tablet 1  . latanoprost (XALATAN) 0.005 % ophthalmic solution Place 1 drop into both  eyes at bedtime.    [name] LORazepam (ATIVAN) 0.5 MG tablet TAKE 2 TABLETS AT BEDTIME. 60 tablet 0  . Melatonin 5 MG TABS Take 1 tablet by mouth at bedtime.     . metoprolol succinate (TOPROL-XL) 50 MG 24 hr tablet TAKE 3 TABLETS ONCE DAILY WITH FOOD. 90 tablet 1  . Multiple Vitamins-Iron (MULTIVITAMINS WITH IRON) TABS Take 1 tablet by mouth 2 (two) times daily.     [name] nystatin ointment (MYCOSTATIN)   0  . omega-3 acid ethyl esters (LOVAZA) 1 G capsule Take 1 g by mouth daily.     [name] omeprazole (PRILOSEC) 20 MG capsule Take 20 mg by mouth daily.     Glory Rosebush DELICA LANCETS FINE MISC 1 Units by Does not apply route as directed. 100 each 3  . PARoxetine (PAXIL) 10 MG tablet Take 2.5 tablets (25 mg total) by mouth daily. 225 tablet 1  . quinapril (ACCUPRIL) 40 MG tablet TAKE 1 TABLET ONCE DAILY. 90 tablet 3  . rivaroxaban (XARELTO) 20 MG TABS tablet Take 1 tablet (20 mg total) by mouth daily with supper. 90 tablet 1  . rOPINIRole (REQUIP) 0.5 MG tablet Take 1 tablet (0.5 mg total) by mouth at bedtime. 30 tablet 0  . rosuvastatin (CRESTOR) 10 MG tablet Take 1 tablet (10 mg total) by mouth at bedtime. 30 tablet 12  . senna (SENOKOT) 8.6 MG TABS Take 1 tablet by mouth daily.     [name] levalbuterol (XOPENEX) 0.63 MG/3ML nebulizer solution Take 3 mLs (0.63 mg total) by nebulization every 6 (six) hours as needed for wheezing or shortness of breath. (Patient not taking: Reported on 05/08/2017) 75 mL 1  . XOPENEX HFA 45 MCG/ACT inhaler USE 1 TO 2 PUFFS EVERY 6 HOURS AS NEEDED FOR WHEEZING. (Patient not taking: Reported on 05/08/2017) 15 g 0   No current facility-administered medications for this encounter.     Allergies  Allergen Reactions  . Benadryl [Diphenhydramine Hcl] Other (See Comments)    Restless leg  . Clopidogrel Bisulfate Nausea Only    Nausea & pain  . Contrast Media [Iodinated Diagnostic Agents] Palpitations    Rapid heart rate, hot  Social History   Social History  . Marital status:  Married    Spouse name: N/A  . Number of children: 2  . Years of education: master's   Occupational History  . 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 Smoking status: Former Smoker    Packs/day: 1.50    Years: 30.00    Types: Cigarettes    Quit date: 08/13/1993  . Smokeless tobacco: Never Used  . Alcohol use No  . Drug use: No  . Sexual activity: Not on file   Other Topics Concern  . Not on file   Social History Narrative  . No narrative on file    Family History  Problem Relation Age of Onset  . Heart attack Father 2       2nd MI at 4  . Colon cancer Brother 57  . Gout Brother   . Ulcers Mother   . Emphysema Mother 6  . Colon polyps Sister   . Cancer Sister        Basal cell carcinoma  . Pneumonia Maternal Grandmother   . Hypertension Brother   . Hyperlipidemia Brother   . Cancer Brother        Skin  . Diabetes Neg Hx   . Stroke Neg Hx     ROS- All systems are reviewed and negative except as per the HPI above  Physical Exam: Vitals:   05/08/17 1345  BP: 126/64  Pulse: 82  Weight: 193 lb 6.4 oz (87.7 kg)  Height: 5\' 2"  (1.575 m)    GEN- The patient is well appearing, alert and oriented x 3 today.   Head- normocephalic, atraumatic Eyes-  Sclera clear, conjunctiva pink Ears- hearing intact Oropharynx- clear Neck- supple, no JVP Lymph- no cervical lymphadenopathy Lungs- Clear to ausculation bilaterally, normal work of breathing Heart- irregular rate and rhythm, no murmurs, rubs or gallops, PMI not laterally displaced GI- soft, NT, ND, + BS Extremities- no clubbing, cyanosis, or edema MS- no significant deformity or atrophy Skin- no rash or lesion Psych- euthymic mood, full affect Neuro- strength and sensation are intact  EKG-afib at 82 bpm, qrs int 80 ms, qtc 420 ms Epic records reviewed  Echo Study Conclusions 07/13/16  - Left ventricle: The cavity size was normal. Wall thickness was   increased in a pattern  of mild LVH. Systolic function was normal.   The estimated ejection fraction was in the range of 60% to 65%.   Incoordinate septal motion. Low normal GLPSS at -18% with mild   anteroseptal and inferoseptal strain abnormality. The study is   not technically sufficient to allow evaluation of LV diastolic   function. - Aortic valve: Sclerosis without stenosis. There was no   regurgitation. - Mitral valve: Calcified annulus. There was mild regurgitation. - Left atrium: Moderately dilated. - Right ventricle: The cavity size was mildly dilated. Systolic   function was low normal. - Right atrium: The atrium was mildly dilated. - Inferior vena cava: The vessel was normal in size. The   respirophasic diameter changes were in the normal range (= 50%),   consistent with normal central venous pressure.  Impressions:  - Compared to a prior echo in 2015, the LVEF is higher at 60-65%.   There is now moderate LAE and mild RAE, mild MR and atrial   fibrillation is noted.    Assessment and Plan:  1. Symptomatic persistent afib Previous successful cardioversion 06/22/16 but ERAF Continue metoprolol 150  mg q am Continue 25 mg metoprolol at hs as needed for  rate control if needed Discussed all antiarrythmic's but she is unwilling to stop antidepressant which has qtc prolonging effect  On young side for amiodrone 1c agents are not an option due to CAD Not an candidate for ablation due to being persistent x one year, never being on antiarrythmic Continue  xarelto for chadsvasc score of 5  2. Lifestyle risk factors Continue with lifestyle efforts Has spinal stenosis and does not exercise Encouraged to water walk or try recumbent bike which the pt thinks she can do without pain.  3. Snoring/possible sleep apnea Pt never carried thru with sleep study, is now willing to reschedule this test  4. HTN stable  5. CAD No current angina symptoms    Geroge Baseman. Ernestine Langworthy, [facility] [address] [place],  [id number] [phone]

## 2017-05-20 ENCOUNTER — Other Ambulatory Visit: Payer: Self-pay | Admitting: Internal Medicine

## 2017-05-22 ENCOUNTER — Other Ambulatory Visit: Payer: Self-pay | Admitting: Cardiovascular Disease

## 2017-05-23 ENCOUNTER — Other Ambulatory Visit: Payer: Self-pay | Admitting: Internal Medicine

## 2017-05-23 NOTE — Telephone Encounter (Signed)
Faxed to [facility]

## 2017-05-29 ENCOUNTER — Other Ambulatory Visit: Payer: Self-pay | Admitting: Orthopedic Surgery

## 2017-05-29 DIAGNOSIS — M545 Low back pain: Secondary | ICD-10-CM

## 2017-06-10 ENCOUNTER — Encounter: Payer: Self-pay | Admitting: Neurology

## 2017-06-10 ENCOUNTER — Ambulatory Visit (INDEPENDENT_AMBULATORY_CARE_PROVIDER_SITE_OTHER): Payer: PPO | Admitting: Neurology

## 2017-06-10 VITALS — BP 119/62 | HR 115 | Ht 62.0 in | Wt 194.0 lb

## 2017-06-10 DIAGNOSIS — I482 Chronic atrial fibrillation, unspecified: Secondary | ICD-10-CM

## 2017-06-10 DIAGNOSIS — E669 Obesity, unspecified: Secondary | ICD-10-CM

## 2017-06-10 DIAGNOSIS — G4730 Sleep apnea, unspecified: Secondary | ICD-10-CM | POA: Diagnosis not present

## 2017-06-10 DIAGNOSIS — G4710 Hypersomnia, unspecified: Secondary | ICD-10-CM

## 2017-06-10 DIAGNOSIS — R0683 Snoring: Secondary | ICD-10-CM

## 2017-06-10 DIAGNOSIS — E1100 Type 2 diabetes mellitus with hyperosmolarity without nonketotic hyperglycemic-hyperosmolar coma (NKHHC): Secondary | ICD-10-CM | POA: Diagnosis not present

## 2017-06-10 NOTE — Progress Notes (Signed)
SLEEP MEDICINE CLINIC   Provider:  Larey Seat, M D  Referring Provider: Hoyt Koch, * Primary Care Physician:  Hoyt Koch, MD  Chief Complaint  Patient presents with  . Follow-up    pt alone, rm 11. pt presents today and she is ready to proceed forward with a sleep study. when it was ordered in feb 2017 patient had difficulty with getting a time scheduled and then broke her foot and so is now healed and recovered from that and would like to get a sleep study comepleted.     HPI: 06-10-2017, I had the opportunity of meeting [name] on February 28 last year, about 20 months ago. In the meantime she would like to be further evaluated a past medical history has not significantly changed. Her chief complaints of neither. She continues to be treated on Xarelto for atrial fibrillation with rapid ventricular response, takes Cardizem for rate control, gabapentin, estradiol, Invokana, Symbicort inhaler, Janumet, she still taking Paxil, but she is no longer taking Ultram.  She is still in need of a formal sleep evaluation, her atrial fibrillation has been difficult to control. She has failed 4 cardio-versions. Could not take Tikosyn as it interferes with her paxil.  insisted on continuing Paxil, out of fear to have panic attacks again. 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 She lives with her wife, has a supportive sister. She adopted 2 children.      HPI:  [name] is a 66 y.o. female , seen here as a referral from Dr. Sharlet Salina for a sleep consultation,   Chief complaint according to patient : " insomnia- difficulties to go to sleep , but I can stay asleep" . 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  [name] is a patient with atrial fibrillation and sleep initiation insomnia, she also had a Bell's palsy in 2001, a cholecystectomy in 2012 cardiac stenting in 2009 and a hysterectomy in 1982. Her past medical history includes  hypertension, diabetes, hypercholesterolemia, heart disease atrial fibrillation, anxiety and migraine. In her review of systems she endorsed further easy bruising, shortness of breath, chest pain and palpitation being aware of a cardiac murmur, restless legs and headaches. It was upon further recommendation of her cardiologist at the atrial fibrillation clinic at [facility] where she was recommended to undergo a sleep study. The patient was educated about the correlation between atrial fibrillation and obstructive sleep apnea. She sees Dr. Quay Burow. She has a panic disorder and takes Ativan.   Paroxysmal atrial fibrillation was first diagnosed in May 2015 with a successful conversion 2 first of May of the same year and after that she relapsed into a fib again in September of the same year. She then suffered in early 2017 recurrent episode and her cardiologist increased her metoprolol 250 minute times a day to control the rate. She underwent another cardioversion in feb 2017 ,  converted on the dose and has been maintaining sinus rhythm for one week.  Sleep habits are as follows: She takes Lorazepam at night and melatonin. She sleeps on her right side. She wakes up prone. Bedroom is cold, quiet and dark.  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 Working 2- 8 PM, late dinner, reading. And going to bed after midnight after some " me time" . She sleeps 7-8 hours and often rises at noon. She sleeps with her spouse, she reportedly  kicks her wife a lot.  She often wakes with palpitations and noted a very high blood pressure. She has woken diaphoretic.  Her spouse wakes up "a zillion times" but can go to sleep. Delayed sleep cycle for years . On weekends she sleeps 10-12 hours , 4 AM  until 2 PM. She does not nap in daytime. She does drink coffee after waking up and after work (!).  She wakes with a dry mouth and no headaches.  Sleep medical history and family  sleep history: her sister has hypersomnia. Social history:  Married to same sex partner,  2 daughters 19 and 15. No ETOH, No tobacco, little  caffeine.   Review of Systems: Out of a complete 14 system review, the patient complains of only the following symptoms, and all other reviewed systems are negative. FSS 61, Epworth 7 points. Geriatric  depression score 2   Social History   Social History  . Marital status: Married    Spouse name: N/A  . Number of children: 2  . Years of education: master's   Occupational History  . 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 Smoking status: Former Smoker    Packs/day: 1.50    Years: 30.00    Types: Cigarettes    Quit date: 08/13/1993  . Smokeless tobacco: Never Used  . Alcohol use No  . Drug use: No  . Sexual activity: Not on file   Other Topics Concern  . Not on file   Social History Narrative  . No narrative on file    Family History  Problem Relation Age of Onset  . Heart attack Father 22       2nd MI at 73  . Colon cancer Brother 22  . Gout Brother   . Ulcers Mother   . Emphysema Mother 58  . Colon polyps Sister   . Cancer Sister        Basal cell carcinoma  . Pneumonia Maternal Grandmother   . Hypertension Brother   . Hyperlipidemia Brother   . Cancer Brother        Skin  . Diabetes Neg Hx   . Stroke Neg Hx     Past Medical History:  Diagnosis Date  . Anemia, unspecified   . Arthritis   . Asthma   . Atrial fibrillation with RVR ([place])    a. on Xarelto  . Bell's palsy    Facial nerve decompression in 2001  . Chronic low back pain   . COPD with asthma ([place])   . Coronary artery disease    Myoview 04/12/11 was entirely normal. ECHO 02/26/08 showed only minor abnormalities. Stenting 05/26/08 of her posterolateral branch to the left circumflex coronary artery. Used a 2.5x86mm Taxus Monorail stent.myoview 2014 was without ischemia  . Diabetes mellitus    Type 2  . GERD  (gastroesophageal reflux disease)   . Glaucoma   . Goiter   . Heart murmur   . History of nuclear stress test 2012; 2014   lexiscan; normal pattern of perfusion; normal, low risk scan   . Hyperlipidemia   . Hypertension   . Pneumonia 2008  . Polycystic ovary disease    Hysterectomy in 1982 for this  . Shortness of breath dyspnea    ECHO 02/26/08 showed only minor abnormalities  . Spinal stenosis     Past Surgical History:  Procedure Laterality Date  . ABDOMINAL HYSTERECTOMY  1982   & BSO; for polycystic  ovary disease  . CARDIOVERSION N/A 12/17/2013   Procedure: CARDIOVERSION;  Surgeon: Pixie Casino, MD;  Location: [facility] ENDOSCOPY;  Service: Cardiovascular;  Laterality: N/A;  . CARDIOVERSION N/A 09/30/2015   Procedure: CARDIOVERSION;  Surgeon: Dorothy Spark, MD;  Location: [facility] ENDOSCOPY;  Service: Cardiovascular;  Laterality: N/A;  . CARDIOVERSION N/A 06/22/2016   Procedure: CARDIOVERSION;  Surgeon: Skeet Latch, MD;  Location: [place];  Service: Cardiovascular;  Laterality: N/A;  . COLONOSCOPY     last 2009; Dr Cristina Gong; due 2019  . CORONARY ANGIOPLASTY  05/26/2008   Stenting of her posterolateral branch to the left circumflex coronary artery. Used a 2.5x55mm Taxus Monorail stent.  [name] FACIAL NERVE DECOMPRESSION  2001/2002   bells palsy   . LAPAROSCOPIC CHOLECYSTECTOMY  06/15/2011    Dr Dalbert Batman  . TEE WITHOUT CARDIOVERSION N/A 12/17/2013   Procedure: TRANSESOPHAGEAL ECHOCARDIOGRAM (TEE);  Surgeon: Pixie Casino, MD;  Location: [facility] ENDOSCOPY;  Service: Cardiovascular;  Laterality: N/A;  trish/ja  . TRANSTHORACIC ECHOCARDIOGRAM  4/43/1540   LV systolic function normal with mild conc LVH; LA mildly dilated; trace MR/TR  . UPPER GI ENDOSCOPY  2009   negative    Current Outpatient Prescriptions  Medication Sig Dispense Refill  . ascorbic acid (VITAMIN C) 1000 MG tablet Take 1,000 mg by mouth 2 (two) times daily.     . budesonide-formoterol (SYMBICORT) 160-4.5 MCG/ACT  inhaler USE 2 PUFFS TWICE DAILY 10.2 g 0  . CALCIUM-MAG-VIT C-VIT D PO Take 2 tablets by mouth daily.     . celecoxib (CELEBREX) 200 MG capsule Take 200 mg by mouth 2 (two) times daily.     . Continuous Blood Gluc Sensor (FREESTYLE LIBRE SENSOR SYSTEM) MISC Use to monitor sugars 1 each 0  . diltiazem (CARDIZEM CD) 180 MG 24 hr capsule TAKE 1 CAPSULE DAILY. 90 capsule 1  . estradiol (ESTRACE) 0.5 MG tablet Take 0.5 mg by mouth daily.     . ferrous gluconate (IRON 27) 240 (27 FE) MG tablet Take 240 mg by mouth 3 (three) times daily with meals.    . gabapentin (NEURONTIN) 100 MG capsule TAKE (1) CAPSULE THREE TIMES DAILY. (Patient taking differently: bid) 180 capsule 2  . guaiFENesin (MUCINEX) 600 MG 12 hr tablet Take 600 mg by mouth 2 (two) times daily.    [name] HYDROcodone-acetaminophen (NORCO/VICODIN) 5-325 MG tablet Take 1 tablet by mouth.   0  . INVOKANA 100 MG TABS tablet TAKE 1 TABLET ONCE DAILY. 30 tablet 2  . isosorbide mononitrate (IMDUR) 30 MG 24 hr tablet TAKE 1 TABLET ONCE DAILY. 30 tablet 6  . JANUMET 50-1000 MG tablet TAKE 1/2 TABLET IN THE MORNING WITH BREAKFAST AND 1 TABLET IN THE EVENING WITH SUPPER. 140 tablet 1  . latanoprost (XALATAN) 0.005 % ophthalmic solution Place 1 drop into both eyes at bedtime.    . levalbuterol (XOPENEX) 0.63 MG/3ML nebulizer solution Take 3 mLs (0.63 mg total) by nebulization every 6 (six) hours as needed for wheezing or shortness of breath. 75 mL 1  . LORazepam (ATIVAN) 0.5 MG tablet TAKE 2 TABLETS AT BEDTIME. 60 tablet 2  . Melatonin 5 MG TABS Take 1 tablet by mouth at bedtime.     . metoprolol succinate (TOPROL-XL) 25 MG 24 hr tablet Take 25 mg by mouth daily.     . metoprolol succinate (TOPROL-XL) 50 MG 24 hr tablet TAKE 3 TABLETS ONCE DAILY WITH FOOD. 90 tablet 1  . Multiple Vitamins-Iron (MULTIVITAMINS WITH IRON) TABS Take 1 tablet by  mouth 2 (two) times daily.     [name] nystatin ointment (MYCOSTATIN)   0  . omega-3 acid ethyl esters (LOVAZA) 1 G capsule  Take 1 g by mouth daily.     [name] omeprazole (PRILOSEC) 20 MG capsule Take 20 mg by mouth daily.     Glory Rosebush DELICA LANCETS FINE MISC 1 Units by Does not apply route as directed. 100 each 3  . PARoxetine (PAXIL) 10 MG tablet Take 2.5 tablets (25 mg total) by mouth daily. 225 tablet 1  . quinapril (ACCUPRIL) 40 MG tablet TAKE 1 TABLET ONCE DAILY. 90 tablet 3  . rivaroxaban (XARELTO) 20 MG TABS tablet Take 1 tablet (20 mg total) by mouth daily with supper. 90 tablet 1  . rOPINIRole (REQUIP) 0.5 MG tablet TAKE ONE TABLET AT BEDTIME. 30 tablet 0  . rosuvastatin (CRESTOR) 10 MG tablet Take 1 tablet (10 mg total) by mouth at bedtime. 30 tablet 12  . senna (SENOKOT) 8.6 MG TABS Take 1 tablet by mouth daily.     Penne Lash HFA 45 MCG/ACT inhaler USE 1 TO 2 PUFFS EVERY 6 HOURS AS NEEDED FOR WHEEZING. (Patient not taking: Reported on 05/08/2017) 15 g 0   No current facility-administered medications for this visit.     Allergies as of 06/10/2017 - Review Complete 06/10/2017  Allergen Reaction Noted  . Benadryl [diphenhydramine hcl] Other (See Comments) 06/15/2011  . Clopidogrel bisulfate Nausea Only   . Contrast media [iodinated diagnostic agents] Palpitations 06/15/2011    Vitals: BP 119/62   Pulse (!) 115   Ht 5\' 2"  (1.575 m)   Wt 194 lb (88 kg)   BMI 35.48 kg/m  Last Weight:  Wt Readings from Last 1 Encounters:  06/10/17 194 lb (88 kg)   WCH:ENID mass index is 35.48 kg/m.     Last Height:   Ht Readings from Last 1 Encounters:  06/10/17 5\' 2"  (1.575 m)    Physical exam:  General: The patient is awake, alert and appears not in acute distress. The patient is obese. Head: Normocephalic, atraumatic. Neck is supple. Mallampati 3  neck circumference:16.25 . Nasal airflow intact , TMJ is evident . Retrognathia is seen.  Cardiovascular:  irregular rate and rhythm , without  murmurs or carotid bruit, and without distended neck veins. Respiratory: Lungs are clear to auscultation. Skin:   Without evidence of edema, or rash Trunk: BMI is elevated . The patient's posture is stooped Neurologic exam : The patient is awake and alert, oriented to place and time.   Memory subjective  described as intact. Attention span & concentration ability appears normal.  Speech is fluent, without dysarthria, dysphonia or aphasia.  Mood and affect are appropriate.  Cranial nerves: Pupils are equal and briskly reactive to light. Funduscopic exam deferred. Extraocular movements in vertical and horizontal planes intact and without nystagmus. Visual fields by finger perimetry are intact.Hearing to finger rub intact. Facial sensation intact to fine touch. Facial motor strength is symmetric and tongue and uvula move midline. Shoulder shrug was symmetrical.  Motor exam:  Normal tone, muscle bulk and symmetric strength in all extremities. Sensory:  Fine touch, pinprick and vibration in the upper extremities was normal. Coordination: Finger-to-nose maneuver  without evidence of ataxia, dysmetria or tremor. Gait and station: Patient walks without assistive device. Strength within normal limits.Stance is stable and normal.Tandem gait is unfragmented. Turns with 3 Steps.  Deep tendon reflexes: in the  upper and lower extremities are symmetric and intact. Babinski maneuver response is  downgoing.  The patient was advised of the nature of the diagnosed sleep disorder , the treatment options and risks for general a health and wellness arising from not treating the condition.  I spent more than 30 minutes of face to face time with the patient. Greater than 50% of time was spent in counseling and coordination of care. We have discussed the diagnosis and differential and I answered the patient's questions.     Assessment:  After physical and neurologic examination, review of laboratory studies,  Personal review of imaging studies, reports of other /same  Imaging studies ,  Results of polysomnography/ neurophysiology  testing and pre-existing records as far as provided in visit., my assessment is   1)The patient has been told by her daughters that she snores and that she has crescendo snoring, based on her neck circumference and her BMI she is at a higher risk of having obstructive sleep apnea. Atrial fibrillation also seen more frequently and patients with obstructive sleep apnea also given of costs sleep apnea -obstructive sleep apnea can cause atrial fibrillation.  She cannot undergo ablation while in almost constant atrial fibrillation- and her a fib has taken her energy away, her stamina. 2) Obesity. Weights 194 - 188 pounds -her BMI is still elevated for this reason we will order a split night polysomnography with capnography and if the patient does not have apnea but remains hypoxemic for over 60 minutes total time I want her  to be titrated to oxygen. 3) delayed sleep rhythm, circadian shift - still asking for a daytime sleep study.   = patient sleeps , later and later and longer into the day. Has been asked to adjust her sleep routine.    Plan:  Treatment plan and additional workup :  Daytime sleep study , atrial fib and obesity , snoring and possible apnea.  Needs a SPLIT- with capnography. Titrate to oxygen if needed.       Asencion Partridge Martice Doty MD  06/10/2017   CC: Hoyt Koch, Md [place],  [id number]

## 2017-06-11 ENCOUNTER — Other Ambulatory Visit: Payer: PPO

## 2017-06-12 ENCOUNTER — Other Ambulatory Visit: Payer: Self-pay | Admitting: Cardiovascular Disease

## 2017-06-12 NOTE — Telephone Encounter (Signed)
Rx has been sent to the pharmacy electronically. ° °

## 2017-06-14 ENCOUNTER — Encounter: Payer: Self-pay | Admitting: Internal Medicine

## 2017-06-14 ENCOUNTER — Other Ambulatory Visit: Payer: Self-pay | Admitting: Internal Medicine

## 2017-06-15 ENCOUNTER — Other Ambulatory Visit: Payer: Self-pay | Admitting: Cardiovascular Disease

## 2017-06-15 ENCOUNTER — Other Ambulatory Visit: Payer: Self-pay | Admitting: Cardiology

## 2017-06-28 ENCOUNTER — Encounter: Payer: Self-pay | Admitting: Cardiovascular Disease

## 2017-06-28 ENCOUNTER — Ambulatory Visit: Payer: PPO | Admitting: Cardiovascular Disease

## 2017-06-28 ENCOUNTER — Other Ambulatory Visit: Payer: Self-pay | Admitting: Internal Medicine

## 2017-06-28 DIAGNOSIS — I480 Paroxysmal atrial fibrillation: Secondary | ICD-10-CM | POA: Diagnosis not present

## 2017-06-28 DIAGNOSIS — I10 Essential (primary) hypertension: Secondary | ICD-10-CM | POA: Diagnosis not present

## 2017-06-28 DIAGNOSIS — E7800 Pure hypercholesterolemia, unspecified: Secondary | ICD-10-CM

## 2017-06-28 DIAGNOSIS — Z9861 Coronary angioplasty status: Secondary | ICD-10-CM

## 2017-06-28 DIAGNOSIS — I2510 Atherosclerotic heart disease of native coronary artery without angina pectoris: Secondary | ICD-10-CM | POA: Diagnosis not present

## 2017-06-28 NOTE — Assessment & Plan Note (Signed)
History of CAD status post distal circumflex, posterolateral branch stenting by Dr. Melvern Banker 05/26/08 with a Taxus drug-eluting stent. She did have residual disease in her PDA branch and normal LV function. I re-catheter her 04/20/11 revealed a patent stent with 50% distal PDA stenosis and normal LV function. She denies chest pain or shortness of breath.

## 2017-06-28 NOTE — Assessment & Plan Note (Signed)
History of essential hypertension blood pressure measured 120/60. She is on diltiazem, metoprolol. Continue current meds at current dosing.

## 2017-06-28 NOTE — Patient Instructions (Signed)

## 2017-06-28 NOTE — Progress Notes (Signed)
06/28/2017 [name]   [birth year]  [id number]  Primary Physician Hoyt Koch, MD Primary Cardiologist: Lorretta Harp MD Lupe Carney, [place]  HPI:  [name] is a 66 y.o. female  moderately overweight divorced Caucasian female, mother of 2 adopted children, ages 58 and 32 from [place], who is formerly a patient of Dr. Ky Barban. I saw her 04/19/15. 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 Her risk factors include former tobacco abuse, having quit 18 years ago, diabetes, hypertension, hyperlipidemia, and a positive family history for heart disease. She has adult-onset asthma as well. Dr. Melvern Banker stented her distal circumflex posterolateral branch, May 26, 2008, with some residual disease in the PDA branch, and normal LV function. Myoview performed May 06, 2008, was not ischemic. I saw her summer of last year, at which time she was complaining of chest pain, for which I prescribed Imdur, which she never filled. I suggested a stress test, which was never obtained. Because of her recurrent chest tightness and increasing dyspnea with radiation to her left neck, I performed cardiac catheterization on her, April 20, 2011, revealing a patent stent, 50% distal PDA lesion, with normal LV function. I thought her disease was stable and had not progressed, and recommended medical therapy. She has had no recurrent chest pain since I last saw her. She has some mild dyspnea. Recent lab work /2/13 revealed a total cholesterol of 129, LDL of 63, and HDL of 45.  She saw Cecilie [name] in the office 05/13/13 with chest pain. A subsequent Myoview stress test was entirely normal. She was admitted to the hospital earlier this month with a good RPR which by history had been on for several weeks but prior. She was symptomatic. She'll underwent transesophageal guided DC cardioversion by Dr. [name] successfully with one shock and was placed on Xarelto oral  anticoagulation. She felt markedly improved clinically. She had a recurrent episode of atrial fibrillation 04/25/14 and underwent DC cardioversion by Dr. [name] in the ER. Her atenolol was changed to Toprol-XL 100 mg. She has seen Roderic Palau in the A. Fib clinic since my last visit with her. She still has paroxysmal A. Fib and is actually in A. Fib today. She is was recently seen in the emergency room for chest pain shortness of breath. At that time she was in sinus rhythm. She ruled out for myocardial infarction. A Myoview stress test recently performed as an outpatient 04/17/15 was normal. The study did not gait because she was in A. Fib at that time Since I saw her back 2 years ago she has seen Roderic Palau periodically. She remains in A. fib rate controlled on oral anticoagulation. She denies chest pain or shortness of breath.   Current Meds  Medication Sig  . ascorbic acid (VITAMIN C) 1000 MG tablet Take 1,000 mg by mouth 2 (two) times daily.   . budesonide-formoterol (SYMBICORT) 160-4.5 MCG/ACT inhaler USE 2 PUFFS TWICE DAILY  . CALCIUM-MAG-VIT C-VIT D PO Take 2 tablets by mouth daily.   . celecoxib (CELEBREX) 200 MG capsule Take 200 mg by mouth 2 (two) times daily.   . Continuous Blood Gluc Sensor (FREESTYLE LIBRE SENSOR SYSTEM) MISC Use to monitor sugars  . diltiazem (CARDIZEM CD) 180 MG 24 hr capsule TAKE 1 CAPSULE DAILY.  [name] estradiol (ESTRACE) 0.5 MG tablet Take 0.5 mg by mouth daily.   . ferrous gluconate (IRON 27) 240 (27 FE) MG tablet Take 240 mg by  mouth 3 (three) times daily with meals.  . gabapentin (NEURONTIN) 100 MG capsule TAKE (1) CAPSULE THREE TIMES DAILY.  [name] guaiFENesin (MUCINEX) 600 MG 12 hr tablet Take 600 mg by mouth 2 (two) times daily.  [name] HYDROcodone-acetaminophen (NORCO/VICODIN) 5-325 MG tablet Take 1 tablet by mouth.   . INVOKANA 100 MG TABS tablet TAKE 1 TABLET ONCE DAILY.  . isosorbide mononitrate (IMDUR) 30 MG 24 hr tablet TAKE 1 TABLET ONCE DAILY.  [name] JANUMET  50-1000 MG tablet TAKE 1/2 TABLET IN THE MORNING WITH BREAKFAST AND 1 TABLET IN THE EVENING WITH SUPPER.  [name] latanoprost (XALATAN) 0.005 % ophthalmic solution Place 1 drop into both eyes at bedtime.  . levalbuterol (XOPENEX) 0.63 MG/3ML nebulizer solution Take 3 mLs (0.63 mg total) by nebulization every 6 (six) hours as needed for wheezing or shortness of breath.  [name] LORazepam (ATIVAN) 0.5 MG tablet TAKE 2 TABLETS AT BEDTIME.  . Melatonin 5 MG TABS Take 1 tablet by mouth at bedtime.   . metoprolol succinate (TOPROL-XL) 25 MG 24 hr tablet Take 25 mg by mouth daily.   . metoprolol succinate (TOPROL-XL) 50 MG 24 hr tablet TAKE 3 TABLETS ONCE DAILY WITH FOOD.  . Multiple Vitamins-Iron (MULTIVITAMINS WITH IRON) TABS Take 1 tablet by mouth 2 (two) times daily.   [name] nystatin ointment (MYCOSTATIN)   . omega-3 acid ethyl esters (LOVAZA) 1 G capsule Take 1 g by mouth daily.   [name] omeprazole (PRILOSEC) 20 MG capsule Take 20 mg by mouth daily.   Glory Rosebush DELICA LANCETS FINE MISC 1 Units by Does not apply route as directed.  [name] PARoxetine (PAXIL) 10 MG tablet Take 2.5 tablets (25 mg total) by mouth daily.  . quinapril (ACCUPRIL) 40 MG tablet TAKE 1 TABLET ONCE DAILY.  [name] rOPINIRole (REQUIP) 0.5 MG tablet TAKE ONE TABLET AT BEDTIME.  . rosuvastatin (CRESTOR) 10 MG tablet Take 1 tablet (10 mg total) by mouth at bedtime.  . senna (SENOKOT) 8.6 MG TABS Take 1 tablet by mouth daily.   Alveda Reasons 20 MG TABS tablet TAKE 1 TABLET ONCE DAILY WITH SUPPER.  Penne Lash HFA 45 MCG/ACT inhaler USE 1 TO 2 PUFFS EVERY 6 HOURS AS NEEDED FOR WHEEZING.     Allergies  Allergen Reactions  . Benadryl [Diphenhydramine Hcl] Other (See Comments)    Restless leg  . Clopidogrel Bisulfate Nausea Only    Nausea & pain  . Contrast Media [Iodinated Diagnostic Agents] Palpitations    Rapid heart rate, hot    Social History   Socioeconomic History  . Marital status: Married    Spouse name: Not on file  . Number of children: 2  .  Years of education: master's  . Highest education level: Not on file  Social Needs  . Financial resource strain: Not on file  . Food insecurity - worry: Not on file  . Food insecurity - inability: Not on file  . Transportation needs - medical: Not on file  . Transportation needs - non-medical: Not on file  Occupational History  . Occupation: Electrical engineer    Employer: [employer]  Tobacco Use  . Smoking status: Former Smoker    Packs/day: 1.50    Years: 30.00    Pack years: 45.00    Types: Cigarettes    Last attempt to quit: 08/13/1993    Years since quitting: 23.8  . Smokeless tobacco: Never Used  Substance and Sexual Activity  . Alcohol use: No  . Drug use: No  .  Sexual activity: Not on file  Other Topics Concern  . Not on file  Social History Narrative  . Not on file     Review of Systems: General: negative for chills, fever, night sweats or weight changes.  Cardiovascular: negative for chest pain, dyspnea on exertion, edema, orthopnea, palpitations, paroxysmal nocturnal dyspnea or shortness of breath Dermatological: negative for rash Respiratory: negative for cough or wheezing Urologic: negative for hematuria Abdominal: negative for nausea, vomiting, diarrhea, bright red blood per rectum, melena, or hematemesis Neurologic: negative for visual changes, syncope, or dizziness All other systems reviewed and are otherwise negative except as noted above.    Blood pressure 120/60, pulse 93, height 5\' 2"  (1.575 m), weight 190 lb 9.6 oz (86.5 kg), SpO2 96 %.  General appearance: alert and no distress Neck: no adenopathy, no carotid bruit, no JVD, supple, symmetrical, trachea midline and thyroid not enlarged, symmetric, no tenderness/mass/nodules Lungs: clear to auscultation bilaterally Heart: irregularly irregular rhythm Extremities: extremities normal, atraumatic, no cyanosis or edema Pulses: 2+ and symmetric Skin: Skin color, texture, turgor normal. No rashes  or lesions Neurologic: Grossly normal  EKG atrial fibrillation with a ventricular response of 93 and low limb voltage. I personally reviewed this EKG.  ASSESSMENT AND PLAN:   Hyperlipidemia History of hyperlipidemia on statin therapy with recent liver profile performed 04/23/17 revealing total cholesterol 113, LDL 45 and HDL of 45.  Essential hypertension History of essential hypertension blood pressure measured 120/60. She is on diltiazem, metoprolol. Continue current meds at current dosing.  CAD S/P percutaneous coronary angioplasty History of CAD status post distal circumflex, posterolateral branch stenting by Dr. Melvern Banker 05/26/08 with a Taxus drug-eluting stent. She did have residual disease in her PDA branch and normal LV function. I re-catheter her 04/20/11 revealed a patent stent with 50% distal PDA stenosis and normal LV function. She denies chest pain or shortness of breath.  PAF (paroxysmal atrial fibrillation) (HCC) History of chronic A. fib now rate controlled on Xarelto oral at regulation.      Lorretta Harp MD FACP,FACC,FAHA, [facility] 06/28/2017 3:48 PM

## 2017-06-28 NOTE — Assessment & Plan Note (Signed)
History of chronic A. fib now rate controlled on Xarelto oral at regulation.

## 2017-06-28 NOTE — Assessment & Plan Note (Signed)
History of hyperlipidemia on statin therapy with recent liver profile performed 04/23/17 revealing total cholesterol 113, LDL 45 and HDL of 45.

## 2017-07-03 ENCOUNTER — Ambulatory Visit: Payer: PPO | Admitting: Cardiovascular Disease

## 2017-07-09 ENCOUNTER — Encounter: Payer: Self-pay | Admitting: Internal Medicine

## 2017-07-10 ENCOUNTER — Other Ambulatory Visit: Payer: Self-pay | Admitting: Internal Medicine

## 2017-07-11 ENCOUNTER — Other Ambulatory Visit: Payer: Self-pay | Admitting: Family

## 2017-07-12 ENCOUNTER — Encounter: Payer: Self-pay | Admitting: Internal Medicine

## 2017-07-12 ENCOUNTER — Telehealth: Payer: Self-pay | Admitting: Cardiovascular Disease

## 2017-07-12 NOTE — Telephone Encounter (Signed)
There is already a mychart encounter on this subject.

## 2017-07-12 NOTE — Telephone Encounter (Signed)
The dose for restless leg syndrome is up to 4 mg total per day.  Would advise that she take the 0.5 mg dose up to 2-3 times per day at most.  CV side effects include hypotension and edema, both of which can be problematic or more exacerbated by other medications, especially in higher doses.   She should contact her PCP about what dose is best for her.

## 2017-07-12 NOTE — Telephone Encounter (Signed)
[name] is calling because she has a question about Ropinirole .5 mg . Wants to know if Dr.Berry can write her a prescription for this and can she take take 2 a day when needed and if so will it hurt her . When she takes one it only last for 4-6hrs then the restless legs comes back.  But it is helping . Please Call

## 2017-07-12 NOTE — Telephone Encounter (Signed)
Pt aware of advice given, voiced thanks for call.

## 2017-07-12 NOTE — Telephone Encounter (Signed)
Left pt msg to call. 

## 2017-07-12 NOTE — Telephone Encounter (Signed)
Spoke w patient regarding her ropirinole dose. Dr. Sharlet Salina, PCP prescribed 0.5mg  daily for restless legs/sleep problems.  She's requested MD to increase dose of this medication d/t short duration of action (see recent patient e-mails/telephone notes in EPIC).   Pt wanted to get Dr. Kennon Holter feedback on whether there were any cardiac contraindications to taking a higher dosage. Aware I will route for review and can forward any recommendations to her PCP, but that ultimately decision to prescribe/change dosage is at discretion of Dr. Sharlet Salina. Pt verbalized understanding.

## 2017-07-12 NOTE — Telephone Encounter (Signed)
Please have Erasmo Downer address this question

## 2017-07-18 ENCOUNTER — Ambulatory Visit
Admission: RE | Admit: 2017-07-18 | Discharge: 2017-07-18 | Disposition: A | Discharge status: Home / Self Care | Payer: PPO | Source: Ambulatory Visit | Attending: Orthopedic Surgery | Admitting: Orthopedic Surgery

## 2017-07-18 DIAGNOSIS — M48061 Spinal stenosis, lumbar region without neurogenic claudication: Secondary | ICD-10-CM | POA: Diagnosis not present

## 2017-07-18 DIAGNOSIS — M545 Low back pain: Secondary | ICD-10-CM

## 2017-07-19 ENCOUNTER — Encounter: Payer: Self-pay | Admitting: Internal Medicine

## 2017-07-19 ENCOUNTER — Ambulatory Visit: Payer: PPO | Admitting: Internal Medicine

## 2017-07-19 DIAGNOSIS — G2581 Restless legs syndrome: Secondary | ICD-10-CM | POA: Diagnosis not present

## 2017-07-19 MED ORDER — SITAGLIPTIN PHOS-METFORMIN HCL 50-1000 MG PO TABS: ORAL_TABLET | ORAL | 6 refills | Status: DC

## 2017-07-19 MED ORDER — PAROXETINE HCL 10 MG PO TABS: 25.0000 mg | ORAL_TABLET | Freq: Every day | ORAL | 11 refills | Status: DC

## 2017-07-19 MED ORDER — GABAPENTIN 100 MG PO CAPS: 100.0000 mg | ORAL_CAPSULE | Freq: Three times a day (TID) | ORAL | 6 refills | Status: DC

## 2017-07-19 MED ORDER — ROPINIROLE HCL 0.5 MG PO TABS: 0.5000 mg | ORAL_TABLET | Freq: Every day | ORAL | 6 refills | Status: DC

## 2017-07-19 MED ORDER — LORAZEPAM 0.5 MG PO TABS: 0.5000 mg | ORAL_TABLET | Freq: Every day | ORAL | 5 refills | Status: DC

## 2017-07-19 NOTE — Assessment & Plan Note (Signed)
Will agree to increase requip to 0.5-1 mg at night time. If she is taking 1 mg at night time she needs to take only 0.5 mg ativan (instead of her usual 1 mg).

## 2017-07-19 NOTE — Progress Notes (Signed)
   Subjective:    Patient ID: [name], female    DOB: April 15, 1951, 66 y.o.   MRN: [id number]  HPI The patient is a 66 YO female coming in for follow up of restless legs. She started on requip 0.5 mg at night time and this has helped some. She is not able to sleep well some of the nights due to symptoms. She feels this is affected her QOL. She has had long term problems with falling asleep and also taking gabapentin, ativan at bed time. She takes these all at once. She wants to know if we can adjust the dosing.  Review of Systems  Constitutional: Negative.   HENT: Negative.   Eyes: Negative.   Respiratory: Negative for cough, chest tightness and shortness of breath.   Cardiovascular: Negative for chest pain, palpitations and leg swelling.  Gastrointestinal: Negative for abdominal distention, abdominal pain, constipation, diarrhea, nausea and vomiting.  Musculoskeletal: Positive for myalgias.  Skin: Negative.   Neurological: Negative.   Psychiatric/Behavioral: Negative.       Objective:   Physical Exam  Constitutional: She is oriented to person, place, and time. She appears well-developed and well-nourished.  HENT:  Head: Normocephalic and atraumatic.  Eyes: EOM are normal.  Neck: Normal range of motion.  Cardiovascular: Normal rate and regular rhythm.  Pulmonary/Chest: Effort normal and breath sounds normal. No respiratory distress. She has no wheezes. She has no rales.  Abdominal: Soft. Bowel sounds are normal. She exhibits no distension. There is no tenderness. There is no rebound.  Musculoskeletal: She exhibits no edema.  Neurological: She is alert and oriented to person, place, and time. Coordination normal.  Skin: Skin is warm and dry.  Psychiatric: She has a normal mood and affect.   Vitals:   07/19/17 1432  BP: 126/60  Pulse: 92  Temp: 98.6 F (37 C)  TempSrc: Oral  SpO2: 99%  Weight: 194 lb (88 kg)  Height: 5\' 2"  (1.575 m)      Assessment & Plan:

## 2017-07-19 NOTE — Patient Instructions (Signed)
We have sent in the refills today. I'm sorry you had to come out today.

## 2017-07-31 ENCOUNTER — Telehealth: Payer: Self-pay | Admitting: Internal Medicine

## 2017-07-31 NOTE — Telephone Encounter (Signed)
This was already sent in at her visit and should be at pharmacy.

## 2017-07-31 NOTE — Telephone Encounter (Signed)
Copied from [facility] #[id number]. Topic: Quick Communication - See Telephone Encounter >> Jul 31, 2017  9:24 AM Ahmed Prima L wrote: CRM for notification. See Telephone encounter for:   07/31/17.  Pt came in on the 7th, stated her and Dr Sharlet Salina talked about two diff meds. She states that she could take 2 of the Lorazepam or 2 requip, if she takes 2 of the Lorazepam she has to take 1 of the requip. And if she takes 2 requip then she would have to take one Lorazepam. She wants to tell Dr. Sharlet Salina that she wants to do 2 Requip & 1 Lorazepam. Please send to [facility]. Call back [phone]

## 2017-07-31 NOTE — Telephone Encounter (Signed)
Patient informed. 

## 2017-08-15 ENCOUNTER — Ambulatory Visit (INDEPENDENT_AMBULATORY_CARE_PROVIDER_SITE_OTHER): Payer: PPO | Admitting: Neurology

## 2017-08-15 DIAGNOSIS — E669 Obesity, unspecified: Secondary | ICD-10-CM

## 2017-08-15 DIAGNOSIS — I482 Chronic atrial fibrillation, unspecified: Secondary | ICD-10-CM

## 2017-08-15 DIAGNOSIS — G4710 Hypersomnia, unspecified: Secondary | ICD-10-CM

## 2017-08-15 DIAGNOSIS — G4730 Sleep apnea, unspecified: Secondary | ICD-10-CM

## 2017-08-15 DIAGNOSIS — E1100 Type 2 diabetes mellitus with hyperosmolarity without nonketotic hyperglycemic-hyperosmolar coma (NKHHC): Secondary | ICD-10-CM

## 2017-08-15 DIAGNOSIS — R0683 Snoring: Secondary | ICD-10-CM

## 2017-08-19 ENCOUNTER — Other Ambulatory Visit: Payer: Self-pay | Admitting: Neurology

## 2017-08-19 NOTE — Procedures (Signed)
PATIENT'S NAME:  [name], Milbrath DOB:      [birth year]      MRN:    [id number]     DATE OF RECORDING: 08/15/2017 REFERRING M.D.:  Pricilla Holm, M.D. Study Performed:   Baseline Polysomnogram HISTORY:  67 year old female patient, with chief complaint of: "insomnia- difficulties to go to sleep, but I can stay asleep". She has been an insomniac since childhood. Mrs. Vollmer is a patient with atrial fibrillation and sleep initiation insomnia. Her past medical history includes hypertension, diabetes, and obesity, and hypercholesterolemia, CAD- heart disease with stenting, atrial fibrillation, panic attacks, anxiety and migraine. She reports shortness of breath, chest pain and palpitation, cardiac murmur, restless legs and headaches. It was her cardiologist Dr. Gwenlyn Found at the atrial fibrillation clinic at [facility] who recommended to undergo a sleep study. The patient was educated about the correlation between atrial fibrillation and obstructive sleep apnea.   The patient endorsed the Epworth Sleepiness Scale at 7/24 points and FSS at 61 points ( high degree of fatigue) .  The patient's weight 194 pounds with a height of 62 (inches), resulting in a BMI of 35.7 kg/m2. The patient's neck circumference measured 16.25 inches.  CURRENT MEDICATIONS: Vitamin C; Symbicort; Celebrex; Cardizem CD; Estrace; Iron; Neurontin; Mucinex; Norco; Invokana; Imdur; Janumet; Xophenex nebs; Ativan; Melatonin; Toprol XL; Mycostatin; Lovaza; Prilosec; Paxil; One touch; Accupril; Xarelto; Requip; Crestor;   PROCEDURE:  This is a multichannel digital polysomnogram utilizing the Somnostar 11.2 system.  Electrodes and sensors were applied and monitored per AASM Specifications.   EEG, EOG, Chin and Limb EMG, were sampled at 200 Hz.  ECG, Snore and Nasal Pressure, Thermal Airflow, Respiratory Effort, CPAP Flow and Pressure, Oximetry was sampled at 50 Hz. Digital video and audio were recorded.      BASELINE STUDY: Lights Out was at  07:54 and Lights On at 14:01.  Total recording time (TRT) was 367.5 minutes, with a total sleep time (TST) of only 238 minutes.   The patient's sleep latency was 121.5 minutes.  Her REM latency was 197 minutes and REM was very short. The sleep efficiency was 64.8 %.     SLEEP ARCHITECTURE: WASO (Wake after sleep onset) was 8 minutes.  There were 6 minutes in Stage N1, 124.5 minutes Stage N2, 103.5 minutes Stage N3 and 4 minutes in Stage REM.  The percentage of Stage N1 was 2.5%, Stage N2 was 52.3%, Stage N3 was 43.5% and Stage R (REM sleep) was 1.7%.   RESPIRATORY ANALYSIS:  There were a total of 0 respiratory events:  0 apneas, 0 hypopneas with a 0 respiratory event related arousals (RERAs).The total APNEA/HYPOPNEA INDEX (AHI) was 0/hour and the total RESPIRATORY DISTURBANCE INDEX was 0.0 /hour.  0 events occurred in REM sleep and 0 events in NREM. The patient spent 231.5 minutes of total sleep time in the supine position.  OXYGEN SATURATION & C02:  The Wake baseline 02 saturation was 96%, with the lowest being 89%. Time spent below 89% saturation equaled 0 minutes. Total sleep time greater than 40 torr was 16.90 minutes.   PERIODIC LIMB MOVEMENTS:  The patient had a total of 61 Periodic Limb Movements.  The Periodic Limb Movement (PLM) index was 15.4 and the PLM Arousal index was 14.6/hour.  The arousals were noted as: 12 were spontaneous, 58 were associated with PLMs, and 0 were associated with respiratory events. Audio and video analysis did not show any abnormal or unusual movements, behaviors, phonations or vocalizations.   The  patient took 2 bathroom breaks. Mild to Moderate Snoring was noted. Continuous atrial fibrillation- noted a great variability in heart rate and R to R interval.   Post-study, the patient indicated that sleep was the same as usual.   IMPRESSION:  1. 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 2. Delayed sleep  onset due to RLS ( constant movement)   3. Primary Snoring without apnea.  4. Atrial fibrillation noted.  5. No sleep disordered breathing identified.   RECOMMENDATIONS:  1. There were very frequent periodic limb movements of sleep (PLMS) with associated sleep disruption.  Consider treating the PLMS primarily.  Avoid caffeine-containing beverages and chocolate. Correlation to  clinically present restless legs syndrome (RLS) was positive.  Pharmacotherapy on Requip is already in place.   2. Obtain a serum ferritin level if the clinical history is consistent with RLS.  Consider iron therapy and evaluation for iron deficiency anemia if the serum ferritin level < 50 ng/ml.  Certain medications or substances may aggravate RLS and common offenders may include the following:  nicotine, caffeine, SSRIs (Paroxetine !) , TCAs, phenothiazine, dopamine antagonists, diphenhydramine, and alcohol.   3. Recommend weight loss to improve arthritis and snoring. 4. Consider dedicated sleep psychology referral if insomnia as clinical concern remains after PLMs / RLS are treated.   5. A follow up appointment will be scheduled in the Sleep Clinic at [facility]. The referring provider will be notified of the results.      I certify that I have reviewed the entire raw data recording prior to the issuance of this report in accordance with the Standards of Accreditation of the American Academy of Sleep Medicine (AASM)    Larey Seat, MD       08-19-2017  Diplomat, American Board of Psychiatry and Neurology  Diplomat, American Board of [place] Director, [employer] [facility] [employer]

## 2017-08-19 NOTE — Progress Notes (Signed)
Dear dr. Loletha Grayer, can we change this patient possibly to ZOLOFT ?

## 2017-08-20 ENCOUNTER — Telehealth: Payer: Self-pay | Admitting: Neurology

## 2017-08-20 NOTE — Telephone Encounter (Signed)
-----   Message from Larey Seat, MD sent at 08/19/2017  1:09 PM EST ----- No Sleep Apnea was present, but chronic atrial fibrillation and severe PLMs noted.   I believe the patient needs to change away form Paxil to another SSRI , less likely to induce PLMs. May respond well to Wellbutrin or to ZOLOFT?  Needs likely a higher dose of Requip , too. Dr Sharlet Salina prescribed Requip. Needs iron levels and ferritin checked -if not already done . CD

## 2017-08-20 NOTE — Telephone Encounter (Signed)
Pt returned call. I went over the sleep study stating no apnea present. I did inform the patient of her recommendation and that she sent the report to Dr Sharlet Salina. Pt can discuss with  Dr Sharlet Salina on if there needs to be any medication adjustments. Pt verbalized understanding and was appreciative for the call.

## 2017-08-20 NOTE — Telephone Encounter (Signed)
Called patient to discuss sleep study results. No answer at this time. LVM for the patient to call back.   

## 2017-09-12 ENCOUNTER — Other Ambulatory Visit: Payer: Self-pay | Admitting: Cardiology

## 2017-09-13 NOTE — Telephone Encounter (Signed)
Yes, for 3  Months but she needs an office visit with Dr Gwenlyn Found. Before we refill it again.  Kerin Ransom PA-C 09/13/2017 7:30 AM

## 2017-10-03 DIAGNOSIS — M25571 Pain in right ankle and joints of right foot: Secondary | ICD-10-CM | POA: Diagnosis not present

## 2017-10-03 DIAGNOSIS — M25572 Pain in left ankle and joints of left foot: Secondary | ICD-10-CM | POA: Diagnosis not present

## 2017-10-06 ENCOUNTER — Other Ambulatory Visit: Payer: Self-pay | Admitting: Cardiovascular Disease

## 2017-10-08 ENCOUNTER — Encounter (HOSPITAL_COMMUNITY): Payer: Self-pay | Admitting: Nurse Practitioner

## 2017-10-08 ENCOUNTER — Ambulatory Visit (HOSPITAL_COMMUNITY)
Admission: RE | Admit: 2017-10-08 | Discharge: 2017-10-08 | Disposition: A | Discharge status: Home / Self Care | Payer: PPO | Source: Ambulatory Visit | Attending: Nurse Practitioner | Admitting: Nurse Practitioner

## 2017-10-08 VITALS — BP 136/64 | HR 66 | Ht 62.0 in | Wt 192.4 lb

## 2017-10-08 DIAGNOSIS — E119 Type 2 diabetes mellitus without complications: Secondary | ICD-10-CM | POA: Insufficient documentation

## 2017-10-08 DIAGNOSIS — G510 Bell's palsy: Secondary | ICD-10-CM | POA: Diagnosis not present

## 2017-10-08 DIAGNOSIS — E785 Hyperlipidemia, unspecified: Secondary | ICD-10-CM | POA: Insufficient documentation

## 2017-10-08 DIAGNOSIS — M4800 Spinal stenosis, site unspecified: Secondary | ICD-10-CM | POA: Diagnosis not present

## 2017-10-08 DIAGNOSIS — Z91041 Radiographic dye allergy status: Secondary | ICD-10-CM | POA: Insufficient documentation

## 2017-10-08 DIAGNOSIS — K219 Gastro-esophageal reflux disease without esophagitis: Secondary | ICD-10-CM | POA: Diagnosis not present

## 2017-10-08 DIAGNOSIS — Z79899 Other long term (current) drug therapy: Secondary | ICD-10-CM | POA: Diagnosis not present

## 2017-10-08 DIAGNOSIS — I10 Essential (primary) hypertension: Secondary | ICD-10-CM | POA: Insufficient documentation

## 2017-10-08 DIAGNOSIS — Z90722 Acquired absence of ovaries, bilateral: Secondary | ICD-10-CM | POA: Diagnosis not present

## 2017-10-08 DIAGNOSIS — Z7902 Long term (current) use of antithrombotics/antiplatelets: Secondary | ICD-10-CM | POA: Diagnosis not present

## 2017-10-08 DIAGNOSIS — H409 Unspecified glaucoma: Secondary | ICD-10-CM | POA: Diagnosis not present

## 2017-10-08 DIAGNOSIS — I481 Persistent atrial fibrillation: Secondary | ICD-10-CM | POA: Insufficient documentation

## 2017-10-08 DIAGNOSIS — J449 Chronic obstructive pulmonary disease, unspecified: Secondary | ICD-10-CM | POA: Insufficient documentation

## 2017-10-08 DIAGNOSIS — Z90710 Acquired absence of both cervix and uterus: Secondary | ICD-10-CM | POA: Insufficient documentation

## 2017-10-08 DIAGNOSIS — I480 Paroxysmal atrial fibrillation: Secondary | ICD-10-CM

## 2017-10-08 DIAGNOSIS — Z9889 Other specified postprocedural states: Secondary | ICD-10-CM | POA: Diagnosis not present

## 2017-10-08 DIAGNOSIS — Z87891 Personal history of nicotine dependence: Secondary | ICD-10-CM | POA: Diagnosis not present

## 2017-10-08 DIAGNOSIS — Z833 Family history of diabetes mellitus: Secondary | ICD-10-CM | POA: Insufficient documentation

## 2017-10-08 DIAGNOSIS — R0683 Snoring: Secondary | ICD-10-CM | POA: Insufficient documentation

## 2017-10-08 DIAGNOSIS — Z7984 Long term (current) use of oral hypoglycemic drugs: Secondary | ICD-10-CM | POA: Diagnosis not present

## 2017-10-08 DIAGNOSIS — Z8371 Family history of colonic polyps: Secondary | ICD-10-CM | POA: Diagnosis not present

## 2017-10-08 DIAGNOSIS — Z8379 Family history of other diseases of the digestive system: Secondary | ICD-10-CM | POA: Insufficient documentation

## 2017-10-08 DIAGNOSIS — Z808 Family history of malignant neoplasm of other organs or systems: Secondary | ICD-10-CM | POA: Insufficient documentation

## 2017-10-08 DIAGNOSIS — Z9049 Acquired absence of other specified parts of digestive tract: Secondary | ICD-10-CM | POA: Diagnosis not present

## 2017-10-08 DIAGNOSIS — I2510 Atherosclerotic heart disease of native coronary artery without angina pectoris: Secondary | ICD-10-CM | POA: Diagnosis not present

## 2017-10-08 DIAGNOSIS — Z8249 Family history of ischemic heart disease and other diseases of the circulatory system: Secondary | ICD-10-CM | POA: Insufficient documentation

## 2017-10-08 DIAGNOSIS — Z888 Allergy status to other drugs, medicaments and biological substances status: Secondary | ICD-10-CM | POA: Diagnosis not present

## 2017-10-08 DIAGNOSIS — I482 Chronic atrial fibrillation, unspecified: Secondary | ICD-10-CM

## 2017-10-08 DIAGNOSIS — Z800 Family history of malignant neoplasm of digestive organs: Secondary | ICD-10-CM | POA: Insufficient documentation

## 2017-10-08 DIAGNOSIS — Z836 Family history of other diseases of the respiratory system: Secondary | ICD-10-CM | POA: Insufficient documentation

## 2017-10-08 DIAGNOSIS — Z955 Presence of coronary angioplasty implant and graft: Secondary | ICD-10-CM | POA: Diagnosis not present

## 2017-10-08 MED ORDER — METOPROLOL SUCCINATE ER 25 MG PO TB24: 25.0000 mg | ORAL_TABLET | Freq: Every day | ORAL | 6 refills | Status: DC

## 2017-10-08 NOTE — Progress Notes (Signed)
Patient ID: [name], female   DOB: [birth year], 67 y.o.   MRN: [id number]     Primary Care Physician: Janice Koch, MD Referring Physician: Dr. Ronald Lobo TANZIE Morrison is a 67 y.o. female with a h/o CAD, DM, HTN, obesity, PAF first diagnosed May of 2015 with successful DCCV x2, first in May 2015.Marland KitchenShe recently had an AF episode and metoprolol succinate was increased to 150 mg qd. She converted on this dose and has been maintaining SR.  No previous AAD. Chads vasc score of at least 5, on xarelto daily.   On last visit in the afib clinic, we discussed lifestyle triggers. Pt does not  smoke or drink alcohol.Her BP/Hgba1c is controlled. She is sedentary. She does snore, no previous sleep study. Sleep study is pending. We also discussed antiarrythmic, which she was not interested in at that time.  She saw Dr. Gwenlyn Morrison and was in afib. Amlodipine was stopped and cardizem was started. She converted within the day. Discussed AAD therapy  again, but she is still not interested since she is back in rhythm and would prefer to take a wait and see approach.  She is in the afib clinic 09/26/15 with c/o being in afib x 2 weeks. Symptomatic with fatigue and dyspnea with exertion. We discussed options to restore SR. She is not interested in antiarrythmic's at this time, would like a cardioversion. Last one held her in rhythm since 5/16. If she has ERAF, she will consider AAD.  She returns, 11/26/15, in [place] but is surprised her energy is not much better in SR. She has a mild cough, but mostly fatigue. She has a sleep study, pending. She had a stress test last summer which was low risk. Has noticed some increase in BP.  Pt was being seen again, 06/2016, after some months with c/o afib for several weeks. She  has fatigue with afib. Again, options were discussed and she is still very resultant to start antiarrythmic's. She would not be a candidate for  1C agents. She is not interested in being hospitalized for a  med and is concerned re cost of multaq/tiksoyn. Side effects of amiodarone make her hesitatnt to use this drug, plus she is on the young side for this. Decided on cardioversion.  F/u in afib clinic s/p cardioversion, 06/22/16.She unfortunately has had ERAF. She felt great yesterday but was unaware that she was in afib today. Rate controlled but with v rate of upper 90's and could benefit from more rate control. Again, options were   discussed and she is not candidate for 1c agents, does not want to stop Paxil to allow for tikosyn to be used. She is pending echo 12/1 and a sleep study. Multaq may be an option in conjunction with another cardioversion at a later date.   F/u in afib clinic 07/18/16. She continues to be in rate controlled afib. She visited her family in [place] over [place],and fell and fx her foot which is in a boot. We discussed antiarrythmic's as well and she still can not decide on a good option. She is not willing to come off antidepressant which would be an issue with qtc prolonging drugs such as multaq, sotalol, tikosyn. She had to delay her sleep study until January due to foot fx. Echo updated and stable.  F/u in the afib clinic 4/4. She continues in rate controlled afib. We have discussed antiarrythmic's but she does not like any of the options given. She feels that she  is tolerating the afib OK. Continues on xarelto without bleeding issues. Saw [name] for general cardiology concerns/CAD and thought to be stable. Pt states that she has felt bad for a couple of weeks but thought it was due to chest congestion. Feeling better today.  F/u in afib clinic, 05/08/17. She had a good summer. She continues in afib, rate controlled. Again, options to restore SR discussed and she wants to stay as she is. As long as she paces herself, she feels that her quality of life  is good.  F/u with afib clinic, 2/26, she has done well but had 2 episodes of afib with RVR in the last 2 weeks. She has  had issues with some foot pain and one episode was in the orthopedists office. Another episode was one night that she was not sleeping well. She used prn Cardizem and HR settled down. Other wise no issues over the last week. Still is content to stay in afib. It is well rate controlled today.  Today, she denies symptoms of palpitations, chest pain, shortness of breath, orthopnea, PND, lower extremity edema, dizziness, presyncope, syncope, or neurologic sequela. The patient is tolerating medications without difficulties and is otherwise without complaint today.   Past Medical History:  Diagnosis Date  . Anemia, unspecified   . Arthritis   . Asthma   . Atrial fibrillation with RVR ([place])    a. on Xarelto  . Bell's palsy    Facial nerve decompression in 2001  . Chronic low back pain   . COPD with asthma ([place])   . Coronary artery disease    Myoview 04/12/11 was entirely normal. ECHO 02/26/08 showed only minor abnormalities. Stenting 05/26/08 of her posterolateral branch to the left circumflex coronary artery. Used a 2.5x3mm Taxus Monorail stent.myoview 2014 was without ischemia  . Diabetes mellitus    Type 2  . GERD (gastroesophageal reflux disease)   . Glaucoma   . Goiter   . Heart murmur   . History of nuclear stress test 2012; 2014   lexiscan; normal pattern of perfusion; normal, low risk scan   . Hyperlipidemia   . Hypertension   . Pneumonia 2008  . Polycystic ovary disease    Hysterectomy in 1982 for this  . Shortness of breath dyspnea    ECHO 02/26/08 showed only minor abnormalities  . Spinal stenosis    Past Surgical History:  Procedure Laterality Date  . ABDOMINAL HYSTERECTOMY  1982   & BSO; for polycystic ovary disease  . CARDIOVERSION N/A 12/17/2013   Procedure: CARDIOVERSION;  Surgeon: Janice Casino, MD;  Location: [facility] ENDOSCOPY;  Service: Cardiovascular;  Laterality: N/A;  . CARDIOVERSION N/A 09/30/2015   Procedure: CARDIOVERSION;  Surgeon: Janice Spark, MD;   Location[facility] ENDOSCOPY;  Service: Cardiovascular;  Laterality: N/A;  . CARDIOVERSION N/A 06/22/2016   Procedure: CARDIOVERSION;  Surgeon: Skeet Latch, MD;  Location: [facility];  Service: Cardiovascular;  Laterality: N/A;  . COLONOSCOPY     last 2009; Dr Cristina Gong; due 2019  . CORONARY ANGIOPLASTY  05/26/2008   Stenting of her posterolateral branch to the left circumflex coronary artery. Used a 2.5x84mm Taxus Monorail stent.  [name] FACIAL NERVE DECOMPRESSION  2001/2002   bells palsy   . LAPAROSCOPIC CHOLECYSTECTOMY  06/15/2011    Dr Dalbert Batman  . TEE WITHOUT CARDIOVERSION N/A 12/17/2013   Procedure: TRANSESOPHAGEAL ECHOCARDIOGRAM (TEE);  Surgeon: Janice Casino, MD;  Location: [facility] ENDOSCOPY;  Service: Cardiovascular;  Laterality: N/A;  trish/ja  . TRANSTHORACIC ECHOCARDIOGRAM  8/34/1962   LV systolic function normal with mild conc LVH; LA mildly dilated; trace MR/TR  . UPPER GI ENDOSCOPY  2009   negative    Current Outpatient Medications  Medication Sig Dispense Refill  . ascorbic acid (VITAMIN C) 1000 MG tablet Take 1,000 mg by mouth 2 (two) times daily.     [name] CALCIUM-MAG-VIT C-VIT D PO Take 2 tablets by mouth daily.     . celecoxib (CELEBREX) 200 MG capsule Take 200 mg by mouth 2 (two) times daily.     . Continuous Blood Gluc Sensor (FREESTYLE LIBRE SENSOR SYSTEM) MISC Use to monitor sugars 1 each 0  . CRESTOR 10 MG tablet TAKE ONE TABLET AT BEDTIME. 30 tablet 6  . diltiazem (CARDIZEM CD) 180 MG 24 hr capsule TAKE 1 CAPSULE DAILY. 90 capsule 3  . estradiol (ESTRACE) 0.5 MG tablet Take 0.5 mg by mouth daily.     [name] gabapentin (NEURONTIN) 100 MG capsule Take 1 capsule (100 mg total) by mouth 3 (three) times daily. 180 capsule 6  . guaiFENesin (MUCINEX) 600 MG 12 hr tablet Take 600 mg by mouth 2 (two) times daily.    [name] HYDROcodone-acetaminophen (NORCO/VICODIN) 5-325 MG tablet Take 1 tablet by mouth.   0  . INVOKANA 100 MG TABS tablet TAKE 1 TABLET ONCE DAILY. 30 tablet 2  . isosorbide  mononitrate (IMDUR) 30 MG 24 hr tablet TAKE 1 TABLET ONCE DAILY. 30 tablet 6  . latanoprost (XALATAN) 0.005 % ophthalmic solution Place 1 drop into both eyes at bedtime.    . levalbuterol (XOPENEX) 0.63 MG/3ML nebulizer solution Take 3 mLs (0.63 mg total) by nebulization every 6 (six) hours as needed for wheezing or shortness of breath. 75 mL 1  . LORazepam (ATIVAN) 0.5 MG tablet Take 1-2 tablets (0.5-1 mg total) by mouth at bedtime. 60 tablet 5  . Melatonin 5 MG TABS Take 1 tablet by mouth at bedtime.     . metoprolol succinate (TOPROL-XL) 25 MG 24 hr tablet Take 1 tablet (25 mg total) by mouth daily. 30 tablet 6  . Multiple Vitamins-Iron (MULTIVITAMINS WITH IRON) TABS Take 1 tablet by mouth 2 (two) times daily.     [name] nystatin ointment (MYCOSTATIN)   0  . omega-3 acid ethyl esters (LOVAZA) 1 G capsule Take 1 g by mouth daily.     [name] omeprazole (PRILOSEC) 20 MG capsule Take 20 mg by mouth daily.     Glory Rosebush DELICA LANCETS FINE MISC 1 Units by Does not apply route as directed. 100 each 3  . PARoxetine (PAXIL) 10 MG tablet Take 2.5 tablets (25 mg total) by mouth daily. 225 tablet 11  . quinapril (ACCUPRIL) 40 MG tablet TAKE 1 TABLET ONCE DAILY. 90 tablet 3  . rOPINIRole (REQUIP) 0.5 MG tablet Take 1-2 tablets (0.5-1 mg total) by mouth at bedtime. 60 tablet 6  . senna (SENOKOT) 8.6 MG TABS Take 1 tablet by mouth daily.     . sitaGLIPtin-metformin (JANUMET) 50-1000 MG tablet TAKE 1/2 TABLET IN THE MORNING WITH BREAKFAST AND 1 TABLET IN THE EVENING WITH SUPPER. 140 tablet 6  . XARELTO 20 MG TABS tablet TAKE 1 TABLET ONCE DAILY WITH SUPPER. 90 tablet 1  . budesonide-formoterol (SYMBICORT) 160-4.5 MCG/ACT inhaler USE 2 PUFFS TWICE DAILY (Patient not taking: Reported on 10/08/2017) 10.2 g 0  . XOPENEX HFA 45 MCG/ACT inhaler USE 1 TO 2 PUFFS EVERY 6 HOURS AS NEEDED FOR WHEEZING. (Patient not taking: Reported on 10/08/2017) 15 g 0  No current facility-administered medications for this encounter.      Allergies  Allergen Reactions  . Benadryl [Diphenhydramine Hcl] Other (See Comments)    Restless leg  . Clopidogrel Bisulfate Nausea Only    Nausea & pain  . Contrast Media [Iodinated Diagnostic Agents] Palpitations    Rapid heart rate, hot    Social History   Socioeconomic History  . Marital status: Married    Spouse name: Not on file  . Number of children: 2  . Years of education: master's  . Highest education level: Not on file  Social Needs  . Financial resource strain: Not on file  . Food insecurity - worry: Not on file  . Food insecurity - inability: Not on file  . Transportation needs - medical: Not on file  . Transportation needs - non-medical: Not on file  Occupational History  . Occupation: Electrical engineer    Employer: [employer]  Tobacco Use  . Smoking status: Former Smoker    Packs/day: 1.50    Years: 30.00    Pack years: 45.00    Types: Cigarettes    Last attempt to quit: 08/13/1993    Years since quitting: 24.1  . Smokeless tobacco: Never Used  Substance and Sexual Activity  . Alcohol use: No  . Drug use: No  . Sexual activity: Not on file  Other Topics Concern  . Not on file  Social History Narrative  . Not on file    Family History  Problem Relation Age of Onset  . Heart attack Father 56       2nd MI at 59  . Colon cancer Brother 73  . Gout Brother   . Ulcers Mother   . Emphysema Mother 68  . Colon polyps Sister   . Cancer Sister        Basal cell carcinoma  . Pneumonia Maternal Grandmother   . Hypertension Brother   . Hyperlipidemia Brother   . Cancer Brother        Skin  . Diabetes Neg Hx   . Stroke Neg Hx     ROS- All systems are reviewed and negative except as per the HPI above  Physical Exam: Vitals:   10/08/17 1439  BP: 136/64  Pulse: 66  Weight: 192 lb 6.4 oz (87.3 kg)  Height: 5\' 2"  (1.575 m)    GEN- The patient is well appearing, alert and oriented x 3 today.   Head- normocephalic,  atraumatic Eyes-  Sclera clear, conjunctiva pink Ears- hearing intact Oropharynx- clear Neck- supple, no JVP Lymph- no cervical lymphadenopathy Lungs- Clear to ausculation bilaterally, normal work of breathing Heart- irregular rate and rhythm, no murmurs, rubs or gallops, PMI not laterally displaced GI- soft, NT, ND, + BS Extremities- no clubbing, cyanosis, or edema MS- no significant deformity or atrophy Skin- no rash or lesion Psych- euthymic mood, full affect Neuro- strength and sensation are intact  EKG-afib at 66 bpm, qrs int 80 ms, qtc 420 ms Epic records reviewed  Echo Study Conclusions 07/13/16  - Left ventricle: The cavity size was normal. Wall thickness was   increased in a pattern of mild LVH. Systolic function was normal.   The estimated ejection fraction was in the range of 60% to 65%.   Incoordinate septal motion. Low normal GLPSS at -18% with mild   anteroseptal and inferoseptal strain abnormality. The study is   not technically sufficient to allow evaluation of LV diastolic   function. - Aortic valve: Sclerosis  without stenosis. There was no   regurgitation. - Mitral valve: Calcified annulus. There was mild regurgitation. - Left atrium: Moderately dilated. - Right ventricle: The cavity size was mildly dilated. Systolic   function was low normal. - Right atrium: The atrium was mildly dilated. - Inferior vena cava: The vessel was normal in size. The   respirophasic diameter changes were in the normal range (= 50%),   consistent with normal central venous pressure.  Impressions:  - Compared to a prior echo in 2015, the LVEF is higher at 60-65%.   There is now moderate LAE and mild RAE, mild MR and atrial   fibrillation is noted.    Assessment and Plan:  1. Symptomatic persistent afib Previous successful cardioversion 06/22/16 but ERAF Continue metoprolol 150 mg q am Continue 25 mg metoprolol at hs as needed for  rate control if needed Discussed all  antiarrythmic's in the past  but she is unwilling to stop antidepressant which has qtc prolonging effect  On young side for amiodrone 1c agents are not an option due to CAD Not an candidate for ablation due to being persistent x one year, never being on antiarrythmic Continue  xarelto for chadsvasc score of 5  2. Lifestyle risk factors Continue with lifestyle efforts Has spinal stenosis and does not exercise Encouraged to water walk or try recumbent bike which the pt thinks she can do without pain.  3. Snoring/possible sleep apnea Pt never carried thru with sleep study   4. HTN stable  5. CAD No current angina symptoms  F/u in 6 months  Geroge Baseman. Aryianna Earwood, [facility] [address] [place], [place] [address] [phone]

## 2017-10-25 ENCOUNTER — Ambulatory Visit: Payer: Self-pay | Admitting: *Deleted

## 2017-10-25 ENCOUNTER — Ambulatory Visit: Payer: PPO | Admitting: Podiatry

## 2017-10-25 DIAGNOSIS — D490 Neoplasm of unspecified behavior of digestive system: Secondary | ICD-10-CM | POA: Insufficient documentation

## 2017-10-25 DIAGNOSIS — M8580 Other specified disorders of bone density and structure, unspecified site: Secondary | ICD-10-CM | POA: Insufficient documentation

## 2017-10-25 DIAGNOSIS — F410 Panic disorder [episodic paroxysmal anxiety] without agoraphobia: Secondary | ICD-10-CM | POA: Insufficient documentation

## 2017-10-25 DIAGNOSIS — K219 Gastro-esophageal reflux disease without esophagitis: Secondary | ICD-10-CM | POA: Insufficient documentation

## 2017-10-25 DIAGNOSIS — R011 Cardiac murmur, unspecified: Secondary | ICD-10-CM | POA: Insufficient documentation

## 2017-10-25 DIAGNOSIS — J449 Chronic obstructive pulmonary disease, unspecified: Secondary | ICD-10-CM | POA: Insufficient documentation

## 2017-10-25 DIAGNOSIS — M810 Age-related osteoporosis without current pathological fracture: Secondary | ICD-10-CM | POA: Insufficient documentation

## 2017-10-25 DIAGNOSIS — M1990 Unspecified osteoarthritis, unspecified site: Secondary | ICD-10-CM | POA: Insufficient documentation

## 2017-10-25 DIAGNOSIS — H409 Unspecified glaucoma: Secondary | ICD-10-CM | POA: Insufficient documentation

## 2017-10-25 DIAGNOSIS — N6019 Diffuse cystic mastopathy of unspecified breast: Secondary | ICD-10-CM | POA: Insufficient documentation

## 2017-10-25 DIAGNOSIS — Z8669 Personal history of other diseases of the nervous system and sense organs: Secondary | ICD-10-CM | POA: Insufficient documentation

## 2017-10-25 DIAGNOSIS — N951 Menopausal and female climacteric states: Secondary | ICD-10-CM | POA: Insufficient documentation

## 2017-10-25 DIAGNOSIS — G4700 Insomnia, unspecified: Secondary | ICD-10-CM | POA: Insufficient documentation

## 2017-10-25 DIAGNOSIS — F419 Anxiety disorder, unspecified: Secondary | ICD-10-CM | POA: Insufficient documentation

## 2017-10-25 NOTE — Telephone Encounter (Signed)
   Reason for Disposition . [1] MILD difficulty breathing (e.g., minimal/no SOB at rest, SOB with walking, pulse <100) AND [2] NEW-onset or WORSE than normal  Answer Assessment - Initial Assessment Questions Patient with moderate SOB for several days. Began noticing slight wheeze last night and feeling some tightness over her bronchial tubes. Also, having occasional headache with this. She feels this may be a sinus issue. Care advice given which she will follow this weekend. Offered appointment today but she asked for a Monday appointment.   1. RESPIRATORY STATUS: "Describe your breathing?" (e.g., wheezing, shortness of breath, unable to speak, severe coughing)      Wheezing started last night. 2. ONSET: "When did this breathing problem begin?"    Several days 3. PATTERN "Does the difficult breathing come and go, or has it been constant since it started?"     On exertion it is constant. 4. SEVERITY: "How bad is your breathing?" (e.g., mild, moderate, severe)    - MILD: No SOB at rest, mild SOB with walking, speaks normally in sentences, can lay down, no retractions, pulse < 100.    - MODERATE: SOB at rest, SOB with minimal exertion and prefers to sit, cannot lie down flat, speaks in phrases, mild retractions, audible wheezing, pulse 100-120.    - SEVERE: Very SOB at rest, speaks in single words, struggling to breathe, sitting hunched forward, retractions, pulse > 120     moderate 5. RECURRENT SYMPTOM: "Have you had difficulty breathing before?" If so, ask: "When was the last time?" and "What happened that time?"    Not in a long time 6. CARDIAC HISTORY: "Do you have any history of heart disease?" (e.g., heart attack, angina, bypass surgery, angioplasty)    afib 7. LUNG HISTORY: "Do you have any history of lung disease?"  (e.g., pulmonary embolus, asthma, emphysema)   Yes, she has inhaler for asthma but doesn't use it because it makes her afib worse.  8. 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 OTHER SYMPTOMS: "Do you have any other symptoms? (e.g., dizziness, runny nose, cough, chest pain, fever)   Headache, chest feels tight over bronchial tubes 10. PREGNANCY: "Is there any chance you are pregnant?" "When was your last menstrual period?"      no 11. TRAVEL: "Have you traveled out of the country in the last month?" (e.g., travel history, exposures)      no  Protocols used: BREATHING DIFFICULTY-A-AH

## 2017-10-28 ENCOUNTER — Other Ambulatory Visit (INDEPENDENT_AMBULATORY_CARE_PROVIDER_SITE_OTHER): Payer: PPO

## 2017-10-28 ENCOUNTER — Encounter: Payer: Self-pay | Admitting: Internal Medicine

## 2017-10-28 ENCOUNTER — Ambulatory Visit (INDEPENDENT_AMBULATORY_CARE_PROVIDER_SITE_OTHER): Payer: PPO | Admitting: Internal Medicine

## 2017-10-28 VITALS — BP 122/50 | HR 87 | Temp 98.0°F | Ht 62.0 in | Wt 190.0 lb

## 2017-10-28 DIAGNOSIS — E1159 Type 2 diabetes mellitus with other circulatory complications: Secondary | ICD-10-CM | POA: Diagnosis not present

## 2017-10-28 DIAGNOSIS — J45901 Unspecified asthma with (acute) exacerbation: Secondary | ICD-10-CM

## 2017-10-28 DIAGNOSIS — J441 Chronic obstructive pulmonary disease with (acute) exacerbation: Secondary | ICD-10-CM

## 2017-10-28 DIAGNOSIS — I10 Essential (primary) hypertension: Secondary | ICD-10-CM

## 2017-10-28 LAB — COMPREHENSIVE METABOLIC PANEL
ALT: 14 U/L (ref 0–35)
AST: 19 U/L (ref 0–37)
Albumin: 4.2 g/dL (ref 3.5–5.2)
Alkaline Phosphatase: 51 U/L (ref 39–117)
BUN: 15 mg/dL (ref 6–23)
CO2: 26 mEq/L (ref 19–32)
Calcium: 9.4 mg/dL (ref 8.4–10.5)
Chloride: 101 mEq/L (ref 96–112)
Creatinine, Ser: 0.69 mg/dL (ref 0.40–1.20)
GFR: 89.65 mL/min (ref >60.00)
Glucose, Bld: 199 mg/dL — ABNORMAL HIGH (ref 70–99)
Potassium: 4.3 mEq/L (ref 3.5–5.1)
Sodium: 136 mEq/L (ref 135–145)
Total Bilirubin: 0.5 mg/dL (ref 0.2–1.2)
Total Protein: 7.2 g/dL (ref 6.0–8.3)

## 2017-10-28 LAB — HEMOGLOBIN A1C: Hgb A1c MFr Bld: 6.7 % — ABNORMAL HIGH (ref 4.6–6.5)

## 2017-10-28 MED ORDER — BUDESONIDE-FORMOTEROL FUMARATE 160-4.5 MCG/ACT IN AERO: INHALATION_SPRAY | RESPIRATORY_TRACT | 4 refills | Status: DC

## 2017-10-28 NOTE — Progress Notes (Signed)
   Subjective:    Patient ID: [name], female    DOB: [birth year], 67 y.o.   MRN: [id number]  HPI The patient is a 67 YO female coming in for several problems including breathing (out of symbicort for some time, does not like to use xopenex as it makes her have palpitations, having some cough and SOB over the last week or so, denies fevers or chills, minimal nose and sinus drainage, denies headaches or body aches, overall stable since onset) and her diabetes (still taking invokana, janumet and denies worsening, diet same, exercise same, denies weight change, has neuropathy and this is stable, taking gabapentin which helps some), and her blood pressure (taking diltiazem, imdur, metoprolol and quinapril, denies headaches or chest pains).   Review of Systems  Constitutional: Positive for activity change and appetite change. Negative for chills, fatigue, fever and unexpected weight change.  HENT: Positive for congestion. Negative for ear discharge, ear pain, postnasal drip, rhinorrhea, sinus pressure, sinus pain, sneezing, sore throat, tinnitus, trouble swallowing and voice change.   Eyes: Negative.   Respiratory: Positive for cough and shortness of breath. Negative for chest tightness and wheezing.   Cardiovascular: Negative.   Gastrointestinal: Negative.   Musculoskeletal: Positive for myalgias.  Skin: Negative.   Neurological: Positive for numbness.  Psychiatric/Behavioral: Negative.       Objective:   Physical Exam  Constitutional: She is oriented to person, place, and time. She appears well-developed and well-nourished.  HENT:  Head: Normocephalic and atraumatic.  Eyes: EOM are normal.  Neck: Normal range of motion.  Cardiovascular: Normal rate and regular rhythm.  Pulmonary/Chest: Effort normal and breath sounds normal. No respiratory distress. She has no wheezes. She has no rales.  Some expiratory wheezing  Abdominal: Soft. Bowel sounds are normal. She exhibits no distension.  There is no tenderness. There is no rebound.  Musculoskeletal: She exhibits no edema.  Neurological: She is alert and oriented to person, place, and time. Coordination normal.  Skin: Skin is warm and dry.  Psychiatric: She has a normal mood and affect.   Vitals:   10/28/17 1402  BP: (!) 122/50  Pulse: 87  Temp: 98 F (36.7 C)  TempSrc: Oral  SpO2: 97%  Weight: 190 lb (86.2 kg)  Height: 5\' 2"  (1.575 m)      Assessment & Plan:

## 2017-10-28 NOTE — Patient Instructions (Signed)
Take 2 puffs twice a day for symbicort to help with breathing.   Start taking claritin daily for allergy season.

## 2017-10-29 NOTE — Assessment & Plan Note (Signed)
Refill symbicort and asked her to resume taking 2 puffs BID. She is encouraged if SOB to use xopenex. Will have her call back if no improvement and can do course of prednisone. Would like to avoid this given her concurrent diabetes.

## 2017-10-29 NOTE — Assessment & Plan Note (Signed)
Checking HgA1c and adjust invokana and janumet as needed. Complicated by neuropathy and this is stable and she is taking gabapentin if needed.

## 2017-10-29 NOTE — Assessment & Plan Note (Signed)
Taking diltiazem, imdur, and metoprolol. Checking CMP and adjust as needed. BP at goal.

## 2017-10-30 ENCOUNTER — Telehealth: Payer: Self-pay | Admitting: Cardiovascular Disease

## 2017-10-30 NOTE — Telephone Encounter (Signed)
New Message   Pt calling to check on a leg doppler that Dr. Gwenlyn Found wanted her to get over 6 months ago. Please call

## 2017-10-30 NOTE — Telephone Encounter (Signed)
LMTCB  Per chart review, patient was seen by MD 06/2017 - no testing was ordered. Patient was seen by PA 04/2017  - no testing was ordered.   There are "expired" orders for carotid & LEA dopplers from 2017 in Epic

## 2017-10-31 ENCOUNTER — Ambulatory Visit: Payer: PPO | Admitting: Podiatry

## 2017-10-31 ENCOUNTER — Other Ambulatory Visit: Payer: Self-pay | Admitting: Podiatry

## 2017-10-31 ENCOUNTER — Ambulatory Visit (INDEPENDENT_AMBULATORY_CARE_PROVIDER_SITE_OTHER): Payer: PPO

## 2017-10-31 ENCOUNTER — Encounter: Payer: Self-pay | Admitting: Podiatry

## 2017-10-31 DIAGNOSIS — M779 Enthesopathy, unspecified: Secondary | ICD-10-CM

## 2017-10-31 DIAGNOSIS — M79672 Pain in left foot: Secondary | ICD-10-CM | POA: Diagnosis not present

## 2017-10-31 MED ORDER — TRIAMCINOLONE ACETONIDE 10 MG/ML IJ SUSP
10.0000 mg | Freq: Once | INTRAMUSCULAR | Status: AC
  Administered 2017-10-31: 10 mg

## 2017-11-01 NOTE — Telephone Encounter (Signed)
Left a message to call back.

## 2017-11-01 NOTE — Progress Notes (Signed)
Subjective:   Patient ID: [name], female   DOB: 67 y.o.   MRN: [id number]   HPI Patient presents with severe pain in the left ankle medial side stating that is making increasingly hard for her to walk.  Patient states is been going on for 3 months and she does not remember specific injury and went to another physician who placed her on gabapentin elevation and icing which is not helping her.  Patient does not smoke and likes to be active   Review of Systems  All other systems reviewed and are negative.       Objective:  Physical Exam  Constitutional: She appears well-developed and well-nourished.  Cardiovascular: Intact distal pulses.  Pulmonary/Chest: Effort normal.  Musculoskeletal: Normal range of motion.  Neurological: She is alert.  Skin: Skin is warm.  Nursing note and vitals reviewed.   Neurovascular status intact muscle strength adequate range of motion within normal limits.  Patient is noted to have exquisite discomfort medial side left ankle with moderate depression of the arch noted and has significant posterior tibial dysfunction left foot.  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  I did sheath injection today 3 mg Kenalog 5 mg Xylocaine and applied fascial brace to lift the arch up with instructions for ice therapy and supportive shoe gear usage.  Patient will be seen back for [place] to recheck in the next several weeks and I did explain the importance of lifting the arch which the fascial brace will do for this particular patient  X-ray indicates depression of the arch with moderate collapse of medial longitudinal arch

## 2017-11-01 NOTE — Telephone Encounter (Signed)
Patient stated that she needs to have a lower extremity duplex completed. She has an expired order from 2017.  She was last seen in November of 2018 by Dr. Gwenlyn Found. This test was not mentioned at that time. Message will be routed to the provider for his recommendation.

## 2017-11-05 ENCOUNTER — Telehealth: Payer: Self-pay | Admitting: Cardiovascular Disease

## 2017-11-05 DIAGNOSIS — I739 Peripheral vascular disease, unspecified: Secondary | ICD-10-CM

## 2017-11-05 NOTE — Telephone Encounter (Signed)
Returned call to patient no answer.LMTC. 

## 2017-11-05 NOTE — Telephone Encounter (Signed)
I do not see a reason to perform lower extremity arterial Doppler studies

## 2017-11-05 NOTE — Telephone Encounter (Signed)
New message    Patient returning call to nurse. Patient would like to know the status if getting doppler order.

## 2017-11-06 NOTE — Telephone Encounter (Signed)
Pt called in asking is she can get schedule for her LEA. Per last OV with Dr. Gwenlyn Found in November he stated to wait until she has her MRI of her back then orders LEA, if the back is not causing the leg pain and weakness. Pt last LEA in 11/209 and were reordered in 11/2015 but she did not get them done. Orders placed for pt to get LEA and told f/u with Dr. Gwenlyn Found pending her results. Pt verbalized understanding, no additional questions at this time.

## 2017-11-07 DIAGNOSIS — R2689 Other abnormalities of gait and mobility: Secondary | ICD-10-CM | POA: Diagnosis not present

## 2017-11-08 ENCOUNTER — Other Ambulatory Visit: Payer: Self-pay | Admitting: Cardiovascular Disease

## 2017-11-08 DIAGNOSIS — I739 Peripheral vascular disease, unspecified: Secondary | ICD-10-CM

## 2017-11-11 ENCOUNTER — Other Ambulatory Visit: Payer: Self-pay | Admitting: Cardiovascular Disease

## 2017-11-11 NOTE — Telephone Encounter (Signed)
Spoke to pt to make her aware. Dopplers cancelled.

## 2017-11-12 DIAGNOSIS — M6281 Muscle weakness (generalized): Secondary | ICD-10-CM | POA: Diagnosis not present

## 2017-11-12 DIAGNOSIS — M542 Cervicalgia: Secondary | ICD-10-CM | POA: Diagnosis not present

## 2017-11-12 DIAGNOSIS — M2560 Stiffness of unspecified joint, not elsewhere classified: Secondary | ICD-10-CM | POA: Diagnosis not present

## 2017-11-12 DIAGNOSIS — M25552 Pain in left hip: Secondary | ICD-10-CM | POA: Diagnosis not present

## 2017-11-14 ENCOUNTER — Ambulatory Visit: Payer: PPO | Admitting: Podiatry

## 2017-11-15 ENCOUNTER — Inpatient Hospital Stay (HOSPITAL_COMMUNITY)
Admission: RE | Admit: 2017-11-15 | Payer: PPO | Source: Ambulatory Visit | Attending: Cardiovascular Disease | Admitting: Cardiovascular Disease

## 2017-11-17 ENCOUNTER — Other Ambulatory Visit: Payer: Self-pay | Admitting: Internal Medicine

## 2017-11-21 ENCOUNTER — Encounter: Payer: Self-pay | Admitting: Internal Medicine

## 2017-11-21 ENCOUNTER — Ambulatory Visit (INDEPENDENT_AMBULATORY_CARE_PROVIDER_SITE_OTHER): Payer: PPO | Admitting: Internal Medicine

## 2017-11-21 VITALS — BP 120/60 | HR 62 | Temp 97.9°F | Ht 62.0 in | Wt 195.0 lb

## 2017-11-21 DIAGNOSIS — I739 Peripheral vascular disease, unspecified: Secondary | ICD-10-CM | POA: Diagnosis not present

## 2017-11-21 NOTE — Patient Instructions (Signed)
We are doing the ultrasound of the legs for blood flow.   We will have you stop invokana for 1 month and let us know how you are feeling.

## 2017-11-22 ENCOUNTER — Ambulatory Visit (INDEPENDENT_AMBULATORY_CARE_PROVIDER_SITE_OTHER): Payer: PPO | Admitting: Podiatry

## 2017-11-22 ENCOUNTER — Encounter: Payer: Self-pay | Admitting: Podiatry

## 2017-11-22 ENCOUNTER — Encounter: Payer: Self-pay | Admitting: Internal Medicine

## 2017-11-22 DIAGNOSIS — M779 Enthesopathy, unspecified: Secondary | ICD-10-CM

## 2017-11-22 DIAGNOSIS — I739 Peripheral vascular disease, unspecified: Secondary | ICD-10-CM | POA: Insufficient documentation

## 2017-11-22 NOTE — Progress Notes (Signed)
   Subjective:    Patient ID: [name], female    DOB: [birth year], 67 y.o.   MRN: [id number]  HPI The patient is a 67 YO female coming in for concerns about lower extremity weakness. Going on for several months but overall worsening. She will start walking and very quickly her legs get very tired and might give out on her. She has had back problems and initially thought this was the cause. She sought care and got repeat MRI which was unchanged and not thought to be causing her symptoms. She is now unable to walk more than 25 ft before having to stop and rest. Within minutes the feeling is gone and she is able to continue. No pain in her back. Also concerned that invokana could be causing some of the problems.   Review of Systems  Constitutional: Negative.   Respiratory: Negative for cough, chest tightness and shortness of breath.   Cardiovascular: Negative for chest pain, palpitations and leg swelling.  Gastrointestinal: Negative for abdominal distention, abdominal pain, constipation, diarrhea, nausea and vomiting.  Musculoskeletal: Positive for gait problem.  Skin: Negative.   Neurological: Positive for weakness.  Psychiatric/Behavioral: Negative.       Objective:   Physical Exam  Constitutional: She is oriented to person, place, and time. She appears well-developed and well-nourished.  HENT:  Head: Normocephalic and atraumatic.  Eyes: EOM are normal.  Neck: Normal range of motion.  Cardiovascular: Normal rate and regular rhythm.  Pulses PT good bilaterally  Pulmonary/Chest: Effort normal and breath sounds normal. No respiratory distress. She has no wheezes. She has no rales.  Abdominal: Soft. Bowel sounds are normal. She exhibits no distension. There is no tenderness. There is no rebound.  Musculoskeletal: She exhibits no edema.  Neurological: She is alert and oriented to person, place, and time. Coordination normal.  Skin: Skin is warm and dry.  Psychiatric: She has a normal  mood and affect.   Vitals:   11/21/17 1448  BP: 120/60  Pulse: 62  Temp: 97.9 F (36.6 C)  TempSrc: Oral  SpO2: 99%  Weight: 195 lb (88.5 kg)  Height: 5\' 2"  (1.575 m)      Assessment & Plan:

## 2017-11-22 NOTE — Assessment & Plan Note (Signed)
Would consider this a claudication equivalent. Arterial US ordered today to rule out PVD. She has known CAD so is high risk for PVD. Treat as appropriate. We talked about increasing walking if able.

## 2017-11-24 NOTE — Progress Notes (Signed)
Subjective:   Patient ID: [name], female   DOB: 67 y.o.   MRN: [id number]   HPI Patient presents stating that her arch is feeling somewhat better but she still feels like it is collapsing and she needs some kind of support mechanism   ROS      Objective:  Physical Exam  Neurovascular status intact with patient noted continued depression of the arch left with inflammation fluid and reduce discomfort but present     Assessment:  Tendinitis present with moderate depression of the arch still noted     Plan:  H&P discussed condition and recommended continued brace usage and patient was casted for long-term orthotics to try to reduce plantar stress on the feet.  Patient will be seen back by ped orthotist for fitting and hopefully this will give her great relief of symptoms

## 2017-11-26 ENCOUNTER — Telehealth: Payer: Self-pay | Admitting: Podiatry

## 2017-11-26 NOTE — Telephone Encounter (Signed)
lvm for pt to call me to schedule an appt to see Liliane Channel to be molded for orthotics.Pt did not get molded at her appt per Juliann Pulse.

## 2017-11-29 ENCOUNTER — Ambulatory Visit: Payer: Self-pay

## 2017-11-29 NOTE — Telephone Encounter (Signed)
Patient called in with c/o "SOB." She says "it has gotten worse this week. I am doing house work and have to sit down and rest. It is constant. I have asthma and think all the pollen has triggered this. I have some wheezing, dizziness, dry cough and chest tightness with the coughing, and a runny nose." According to protocol, see PCP within 3 days, appointment scheduled for Monday, 12/02/17 at 1400 with Dr. Sharlet Salina, care advice given, patient verbalized understanding.  Reason for Disposition . [1] MODERATE longstanding difficulty breathing (e.g., speaks in phrases, SOB even at rest, pulse 100-120) AND [2] SAME as normal  Answer Assessment - Initial Assessment Questions 1. RESPIRATORY STATUS: "Describe your breathing?" (e.g., wheezing, shortness of breath, unable to speak, severe coughing)      Shortness of breath, wheezing  2. ONSET: "When did this breathing problem begin?"      Got worse Tuesday or Wednesday 3. PATTERN "Does the difficult breathing come and go, or has it been constant since it started?"      Constant  4. SEVERITY: "How bad is your breathing?" (e.g., mild, moderate, severe)    - MILD: No SOB at rest, mild SOB with walking, speaks normally in sentences, can lay down, no retractions, pulse < 100.    - MODERATE: SOB at rest, SOB with minimal exertion and prefers to sit, cannot lie down flat, speaks in phrases, mild retractions, audible wheezing, pulse 100-120.    - SEVERE: Very SOB at rest, speaks in single words, struggling to breathe, sitting hunched forward, retractions, pulse > 120      Moderate 5. RECURRENT SYMPTOM: "Have you had difficulty breathing before?" If so, ask: "When was the last time?" and "What happened that time?"      Not this bad 6. CARDIAC HISTORY: "Do you have any history of heart disease?" (e.g., heart attack, angina, bypass surgery, angioplasty)      Yes 7. LUNG HISTORY: "Do you have any history of lung disease?"  (e.g., pulmonary embolus, asthma,  emphysema)     Asthma 8. CAUSE: "What do you think is causing the breathing problem?"      I don't know; pollen may have triggered it 9. OTHER SYMPTOMS: "Do you have any other symptoms? (e.g., dizziness, runny nose, cough, chest pain, fever)     Dizziness, runny nose, cough, chest tightnessw/cough 10. PREGNANCY: "Is there any chance you are pregnant?" "When was your last menstrual period?"       No 11. TRAVEL: "Have you traveled out of the country in the last month?" (e.g., travel history, exposures)       No  Protocols used: BREATHING DIFFICULTY-A-AH

## 2017-12-02 ENCOUNTER — Ambulatory Visit: Payer: PPO | Admitting: Internal Medicine

## 2017-12-02 DIAGNOSIS — Z2089 Contact with and (suspected) exposure to other communicable diseases: Secondary | ICD-10-CM

## 2017-12-03 ENCOUNTER — Ambulatory Visit (INDEPENDENT_AMBULATORY_CARE_PROVIDER_SITE_OTHER): Payer: Self-pay | Admitting: Orthotics

## 2017-12-03 DIAGNOSIS — M779 Enthesopathy, unspecified: Secondary | ICD-10-CM

## 2017-12-03 DIAGNOSIS — F419 Anxiety disorder, unspecified: Secondary | ICD-10-CM

## 2017-12-03 DIAGNOSIS — M79672 Pain in left foot: Secondary | ICD-10-CM

## 2017-12-03 NOTE — Addendum Note (Signed)
Addended by: Velora Heckler on: 12/03/2017 02:12 PM   Modules accepted: Level of Service

## 2017-12-03 NOTE — Progress Notes (Signed)
Patient came into today to be cast for [employer]. Upon recommendation of Dr. Paulla Dolly Patient presents with tendonitis left. Goals are to offer cushioned support of longitudal arch  Plan vendor [employer]

## 2017-12-04 ENCOUNTER — Telehealth (HOSPITAL_COMMUNITY): Payer: Self-pay | Admitting: *Deleted

## 2017-12-04 ENCOUNTER — Other Ambulatory Visit: Payer: Self-pay

## 2017-12-04 ENCOUNTER — Encounter (HOSPITAL_COMMUNITY): Payer: Self-pay | Admitting: Emergency Medicine

## 2017-12-04 ENCOUNTER — Emergency Department (HOSPITAL_COMMUNITY): Payer: PPO

## 2017-12-04 ENCOUNTER — Emergency Department (HOSPITAL_COMMUNITY)
Admission: EM | Admit: 2017-12-04 | Discharge: 2017-12-04 | Disposition: A | Discharge status: Home / Self Care | Payer: PPO | Attending: Emergency Medicine | Admitting: Emergency Medicine

## 2017-12-04 DIAGNOSIS — R079 Chest pain, unspecified: Secondary | ICD-10-CM | POA: Diagnosis not present

## 2017-12-04 DIAGNOSIS — Z5321 Procedure and treatment not carried out due to patient leaving prior to being seen by health care provider: Secondary | ICD-10-CM | POA: Diagnosis not present

## 2017-12-04 DIAGNOSIS — R0602 Shortness of breath: Secondary | ICD-10-CM | POA: Diagnosis not present

## 2017-12-04 LAB — CBC
HCT: 25.5 % — ABNORMAL LOW (ref 36.0–46.0)
Hemoglobin: 8.1 g/dL — ABNORMAL LOW (ref 12.0–15.0)
MCH: 21.4 pg — ABNORMAL LOW (ref 26.0–34.0)
MCHC: 31.8 g/dL (ref 30.0–36.0)
MCV: 67.5 fL — ABNORMAL LOW (ref 78.0–100.0)
Platelets: 275 10*3/uL (ref 150–400)
RBC: 3.78 MIL/uL — ABNORMAL LOW (ref 3.87–5.11)
RDW: 17.4 % — ABNORMAL HIGH (ref 11.5–15.5)
WBC: 8 10*3/uL (ref 4.0–10.5)

## 2017-12-04 LAB — BASIC METABOLIC PANEL
Anion gap: 7 (ref 5–15)
BUN: 17 mg/dL (ref 6–20)
CO2: 24 mmol/L (ref 22–32)
Calcium: 8.9 mg/dL (ref 8.9–10.3)
Chloride: 93 mmol/L — ABNORMAL LOW (ref 101–111)
Creatinine, Ser: 0.76 mg/dL (ref 0.44–1.00)
GFR calc Af Amer: >60 mL/min (ref >60)
GFR calc non Af Amer: >60 mL/min (ref >60)
Glucose, Bld: 135 mg/dL — ABNORMAL HIGH (ref 65–99)
Potassium: 4.8 mmol/L (ref 3.5–5.1)
Sodium: 124 mmol/L — ABNORMAL LOW (ref 135–145)

## 2017-12-04 LAB — I-STAT TROPONIN, ED: Troponin i, poc: 0 ng/mL (ref 0.00–0.08)

## 2017-12-04 NOTE — ED Triage Notes (Signed)
Patient complains of chest tightness that started last night. Reports she frequently has chest pain and has a prescription for nitroglycerin but does not take it. Pain was more intense last night 9/10, has lessened to 6/10 now. Also reports periods of dizziness over the last few days.

## 2017-12-04 NOTE — Telephone Encounter (Signed)
Patient called in stating she woke up with Chest pain, nausea and shortness of breath states she just doesn't feel right. Her BP is elevated for her in the 539Y systolic. Instructed pt for chest pain with her symptoms and hx of CAD she should proceed to ER. Pt verbalized understanding.

## 2017-12-05 ENCOUNTER — Telehealth: Payer: Self-pay | Admitting: Cardiovascular Disease

## 2017-12-05 ENCOUNTER — Ambulatory Visit (HOSPITAL_COMMUNITY)
Admission: RE | Admit: 2017-12-05 | Discharge: 2017-12-05 | Disposition: A | Discharge status: Home / Self Care | Payer: PPO | Source: Ambulatory Visit | Attending: Cardiology | Admitting: Cardiology

## 2017-12-05 DIAGNOSIS — I10 Essential (primary) hypertension: Secondary | ICD-10-CM | POA: Insufficient documentation

## 2017-12-05 DIAGNOSIS — E1151 Type 2 diabetes mellitus with diabetic peripheral angiopathy without gangrene: Secondary | ICD-10-CM | POA: Insufficient documentation

## 2017-12-05 DIAGNOSIS — E785 Hyperlipidemia, unspecified: Secondary | ICD-10-CM | POA: Diagnosis not present

## 2017-12-05 DIAGNOSIS — I739 Peripheral vascular disease, unspecified: Secondary | ICD-10-CM | POA: Insufficient documentation

## 2017-12-05 DIAGNOSIS — Z87891 Personal history of nicotine dependence: Secondary | ICD-10-CM | POA: Diagnosis not present

## 2017-12-05 DIAGNOSIS — I2510 Atherosclerotic heart disease of native coronary artery without angina pectoris: Secondary | ICD-10-CM | POA: Insufficient documentation

## 2017-12-05 NOTE — Telephone Encounter (Signed)
lmtcb

## 2017-12-05 NOTE — Telephone Encounter (Signed)
Pt returned call. Pt stated she was in the ED yesterday. She is asking Dr. Gwenlyn Found to look at her blood work and chest xray. These results were concerning to her because of her shortness of breath and chest tightness. She scheduled an appt to talk to Dr. Gwenlyn Found on 12/11/17.  Routing to Dr. Gwenlyn Found as Juluis Rainier.

## 2017-12-05 NOTE — Telephone Encounter (Signed)
New message ° °Pt verbalized that she is returning call for RN °

## 2017-12-05 NOTE — Telephone Encounter (Signed)
Pt wanted to schedule appt to see Dr. Gwenlyn Found because of some new symptoms she is having. She came in to the office today for testing and spoke with me. Called pt mobile number, and someone told me to call pt work number because pt does not get home until 7:30 or 8 pm. Left message on work number to call back.

## 2017-12-11 ENCOUNTER — Encounter: Payer: Self-pay | Admitting: Cardiovascular Disease

## 2017-12-11 ENCOUNTER — Ambulatory Visit: Payer: PPO | Admitting: Cardiovascular Disease

## 2017-12-11 VITALS — BP 122/56 | HR 86 | Ht 62.0 in | Wt 192.8 lb

## 2017-12-11 DIAGNOSIS — Z9861 Coronary angioplasty status: Secondary | ICD-10-CM

## 2017-12-11 DIAGNOSIS — I2510 Atherosclerotic heart disease of native coronary artery without angina pectoris: Secondary | ICD-10-CM

## 2017-12-11 DIAGNOSIS — I739 Peripheral vascular disease, unspecified: Secondary | ICD-10-CM | POA: Diagnosis not present

## 2017-12-11 DIAGNOSIS — I480 Paroxysmal atrial fibrillation: Secondary | ICD-10-CM

## 2017-12-11 DIAGNOSIS — I10 Essential (primary) hypertension: Secondary | ICD-10-CM

## 2017-12-11 NOTE — Assessment & Plan Note (Signed)
History of chronic A. Fib rate controlled on Xarelto oral anticoagulation. Her hemoglobin is 8.1 now compared to 11.8 back in September last year. I suggested that she go see Dr. Sharlet Salina and evaluated for this.

## 2017-12-11 NOTE — Assessment & Plan Note (Signed)
History of hyperlipidemia on statin therapy with lipid profile performed 04/23/17 revealed total cholesterol 113, LDL 45 and HDL 45.

## 2017-12-11 NOTE — Assessment & Plan Note (Signed)
History of essential hypertension blood pressure measured today 122/56. She is on diltiazem, metoprolol and Accupril. Continue current meds at current dosing.

## 2017-12-11 NOTE — Assessment & Plan Note (Signed)
History of claudication with recent segmental pressures that were normal. She does have palpable pedal pulses. I'm going to get lower extremity arterial Doppler studies to further evaluate.

## 2017-12-11 NOTE — Progress Notes (Signed)
12/11/2017 [name]   Oct 15, 1950  [id number]  Primary Physician Hoyt Koch, MD Primary Cardiologist: Lorretta Harp MD Lupe Carney, [place]  HPI:  [name] is a 67 y.o.  moderately overweight divorced Caucasian female, mother of 2 adopted children, ages 1 and 42 from [place], who is formerly a patient of Dr. Ky Barban. 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 Her risk factors include former tobacco abuse, having quit 18 years ago, diabetes, hypertension, hyperlipidemia, and a positive family history for heart disease. She has adult-onset asthma as well. Dr. Melvern Banker stented her distal circumflex posterolateral branch, May 26, 2008, with some residual disease in the PDA branch, and normal LV function. Myoview performed May 06, 2008, was not ischemic. I saw her summer of last year, at which time she was complaining of chest pain, for which I prescribed Imdur, which she never filled. I suggested a stress test, which was never obtained. Because of her recurrent chest tightness and increasing dyspnea with radiation to her left neck, I performed cardiac catheterization on her, April 20, 2011, revealing a patent stent, 50% distal PDA lesion, with normal LV function. I thought her disease was stable and had not progressed, and recommended medical therapy. She has had no recurrent chest pain since I last saw her. She has some mild dyspnea. Recent lab work /2/13 revealed a total cholesterol of 129, LDL of 63, and HDL of 45.  She saw Cecilie [name] in the office 05/13/13 with chest pain. A subsequent Myoview stress test was entirely normal. She was admitted to the hospital earlier this month with a good RPR which by history had been on for several weeks but prior. She was symptomatic. She'll underwent transesophageal guided DC cardioversion by Dr. [name] successfully with one shock and was placed on Xarelto oral anticoagulation.  She felt markedly improved clinically. She had a recurrent episode of atrial fibrillation 04/25/14 and underwent DC cardioversion by Dr. [name] in the ER. Her atenolol was changed to Toprol-XL 100 mg. She has seen Roderic Palau in the A. Fib clinic since my last visit with her. She still has paroxysmal A. Fib and is actually in A. Fib today. She is was recently seen in the emergency room for chest pain shortness of breath. At that time she was in sinus rhythm. She ruled out for myocardial infarction. A Myoview stress test recently performed as an outpatient 04/17/15 was normal. The study did not gait because she was in A. Fib at that time Since I saw her back 6 months ago she's remained relatively stable. She isn't chronic A. Fib rate controlled on oral anticoagulation. Hemoglobin has not dropped to 8.1 from 11.86 months ago she complains of some pain in her legs although her recent segmental pressures were normal. She also was seen in the emergency room is little chest pain for the most part is relatively stable.      Current Meds  Medication Sig  . ascorbic acid (VITAMIN C) 1000 MG tablet Take 1,000 mg by mouth 2 (two) times daily.   . budesonide-formoterol (SYMBICORT) 160-4.5 MCG/ACT inhaler USE 2 PUFFS TWICE DAILY  . CALCIUM-MAG-VIT C-VIT D PO Take 2 tablets by mouth daily.   . celecoxib (CELEBREX) 200 MG capsule Take 200 mg by mouth 2 (two) times daily.   . Continuous Blood Gluc Sensor (FREESTYLE LIBRE SENSOR SYSTEM) MISC Use to monitor sugars  . CRESTOR 10 MG tablet TAKE  ONE TABLET AT BEDTIME.  [name] diltiazem (CARDIZEM CD) 180 MG 24 hr capsule TAKE 1 CAPSULE DAILY.  [name] estradiol (ESTRACE) 0.5 MG tablet Take 0.5 mg by mouth daily.   [name] gabapentin (NEURONTIN) 100 MG capsule Take 1 capsule (100 mg total) by mouth 3 (three) times daily.  [name] guaiFENesin (MUCINEX) 600 MG 12 hr tablet Take 600 mg by mouth 2 (two) times daily.  [name] HYDROcodone-acetaminophen (NORCO/VICODIN) 5-325 MG tablet Take 1 tablet by  mouth.   . INVOKANA 100 MG TABS tablet TAKE 1 TABLET ONCE DAILY.  . isosorbide mononitrate (IMDUR) 30 MG 24 hr tablet TAKE 1 TABLET ONCE DAILY.  [name] latanoprost (XALATAN) 0.005 % ophthalmic solution Place 1 drop into both eyes at bedtime.  . levalbuterol (XOPENEX) 0.63 MG/3ML nebulizer solution Take 3 mLs (0.63 mg total) by nebulization every 6 (six) hours as needed for wheezing or shortness of breath.  [name] LORazepam (ATIVAN) 0.5 MG tablet Take 1-2 tablets (0.5-1 mg total) by mouth at bedtime.  . Melatonin 5 MG TABS Take 1 tablet by mouth at bedtime.   . metoprolol succinate (TOPROL-XL) 25 MG 24 hr tablet Take 1 tablet (25 mg total) by mouth daily.  . Multiple Vitamins-Iron (MULTIVITAMINS WITH IRON) TABS Take 1 tablet by mouth 2 (two) times daily.   [name] nystatin ointment (MYCOSTATIN)   . omega-3 acid ethyl esters (LOVAZA) 1 G capsule Take 1 g by mouth daily.   [name] omeprazole (PRILOSEC) 20 MG capsule Take 20 mg by mouth daily.   Glory Rosebush DELICA LANCETS FINE MISC 1 Units by Does not apply route as directed.  [name] PARoxetine (PAXIL) 10 MG tablet Take 2.5 tablets (25 mg total) by mouth daily.  . quinapril (ACCUPRIL) 40 MG tablet TAKE 1 TABLET ONCE DAILY.  [name] rOPINIRole (REQUIP) 0.5 MG tablet Take 1-2 tablets (0.5-1 mg total) by mouth at bedtime.  . senna (SENOKOT) 8.6 MG TABS Take 1 tablet by mouth daily.   . sitaGLIPtin-metformin (JANUMET) 50-1000 MG tablet TAKE 1/2 TABLET IN THE MORNING WITH BREAKFAST AND 1 TABLET IN THE EVENING WITH SUPPER.  Alveda Reasons 20 MG TABS tablet TAKE 1 TABLET ONCE DAILY WITH SUPPER.  Penne Lash HFA 45 MCG/ACT inhaler USE 1 TO 2 PUFFS EVERY 6 HOURS AS NEEDED FOR WHEEZING.     Allergies  Allergen Reactions  . Benadryl [Diphenhydramine Hcl] Other (See Comments)    Restless leg  . Clopidogrel Bisulfate Nausea Only    Nausea & pain  . Contrast Media [Iodinated Diagnostic Agents] Palpitations    Rapid heart rate, hot    Social History   Socioeconomic History  . Marital  status: Married    Spouse name: Not on file  . Number of children: 2  . Years of education: master's  . Highest education level: Not on file  Occupational History  . Occupation: Forensic scientist: [employer]  . Financial resource strain: Not on file  . Food insecurity:    Worry: Not on file    Inability: Not on file  . Transportation needs:    Medical: Not on file    Non-medical: Not on file  Tobacco Use  . Smoking status: Former Smoker    Packs/day: 1.50    Years: 30.00    Pack years: 45.00    Types: Cigarettes    Last attempt to quit: 08/13/1993    Years since quitting: 24.3  . Smokeless tobacco: Never Used  Substance and Sexual Activity  .  Alcohol use: No  . Drug use: No  . Sexual activity: Not on file  Lifestyle  . Physical activity:    Days per week: Not on file    Minutes per session: Not on file  . Stress: Not on file  Relationships  . Social connections:    Talks on phone: Not on file    Gets together: Not on file    Attends religious service: Not on file    Active member of club or organization: Not on file    Attends meetings of clubs or organizations: Not on file    Relationship status: Not on file  . Intimate partner violence:    Fear of current or ex partner: Not on file    Emotionally abused: Not on file    Physically abused: Not on file    Forced sexual activity: Not on file  Other Topics Concern  . Not on file  Social History Narrative  . Not on file     Review of Systems: General: negative for chills, fever, night sweats or weight changes.  Cardiovascular: negative for chest pain, dyspnea on exertion, edema, orthopnea, palpitations, paroxysmal nocturnal dyspnea or shortness of breath Dermatological: negative for rash Respiratory: negative for cough or wheezing Urologic: negative for hematuria Abdominal: negative for nausea, vomiting, diarrhea, bright red blood per rectum, melena, or hematemesis Neurologic:  negative for visual changes, syncope, or dizziness All other systems reviewed and are otherwise negative except as noted above.    Blood pressure (!) 122/56, pulse 86, height 5\' 2"  (1.575 m), weight 192 lb 12.8 oz (87.5 kg).  General appearance: alert and no distress Neck: no adenopathy, no JVD, supple, symmetrical, trachea midline, thyroid not enlarged, symmetric, no tenderness/mass/nodules and soft right carotid bruit Lungs: clear to auscultation bilaterally Heart: irregularly irregular rhythm Extremities: extremities normal, atraumatic, no cyanosis or edema Pulses: 2+ and symmetric Skin: Skin color, texture, turgor normal. No rashes or lesions Neurologic: Alert and oriented X 3, normal strength and tone. Normal symmetric reflexes. Normal coordination and gait  EKG Not performed today  ASSESSMENT AND PLAN:   Hyperlipidemia History of hyperlipidemia on statin therapy with lipid profile performed 04/23/17 revealed total cholesterol 113, LDL 45 and HDL 45.  Essential hypertension History of essential hypertension blood pressure measured today 122/56. She is on diltiazem, metoprolol and Accupril. Continue current meds at current dosing.  CAD S/P percutaneous coronary angioplasty History of CAD status post distal circumflex posterior lateral branch stenting by Dr. Melvern Banker 05/26/08 some residual disease in the PDA branch and normal LV function. I repeated her catheterization 04/20/2011 revealing a patent stent with relatively unchanged anatomy. She does get occasional angina.  PAF (paroxysmal atrial fibrillation) (HCC) History of chronic A. Fib rate controlled on Xarelto oral anticoagulation. Her hemoglobin is 8.1 now compared to 11.8 back in September last year. I suggested that she go see Dr. Sharlet Salina and evaluated for this.  Claudication [facility]) History of claudication with recent segmental pressures that were normal. She does have palpable pedal pulses. I'm going to get lower extremity  arterial Doppler studies to further evaluate.      Lorretta Harp MD FACP,FACC,FAHA, [facility] 12/11/2017 11:34 AM

## 2017-12-11 NOTE — Patient Instructions (Signed)
Medication Instructions: Your physician recommends that you continue on your current medications as directed. Please refer to the Current Medication list given to you today.   Testing/Procedures: Your physician has requested that you have a lower extremity arterial duplex. During this test, ultrasound is used to evaluate arterial blood flow in the legs. Allow one hour for this exam. There are no restrictions or special instructions.  Your physician has requested that you have an ankle brachial index (ABI). During this test an ultrasound and blood pressure cuff are used to evaluate the arteries that supply the arms and legs with blood. Allow thirty minutes for this exam. There are no restrictions or special instructions.   Follow-Up: Your physician wants you to follow-up in: 6 months with Dr. Berry. You will receive a reminder letter in the mail two months in advance. If you don't receive a letter, please call our office to schedule the follow-up appointment.  If you need a refill on your cardiac medications before your next appointment, please call your pharmacy.  

## 2017-12-11 NOTE — Assessment & Plan Note (Signed)
History of CAD status post distal circumflex posterior lateral branch stenting by Dr. Melvern Banker 05/26/08 some residual disease in the PDA branch and normal LV function. I repeated her catheterization 04/20/2011 revealing a patent stent with relatively unchanged anatomy. She does get occasional angina.

## 2017-12-11 NOTE — Telephone Encounter (Signed)
Her hemoglobin is definitely reduced and I suggested that she see her PCP for further evaluation

## 2017-12-20 ENCOUNTER — Ambulatory Visit (INDEPENDENT_AMBULATORY_CARE_PROVIDER_SITE_OTHER): Payer: PPO | Admitting: Internal Medicine

## 2017-12-20 ENCOUNTER — Encounter: Payer: Self-pay | Admitting: Internal Medicine

## 2017-12-20 ENCOUNTER — Other Ambulatory Visit (INDEPENDENT_AMBULATORY_CARE_PROVIDER_SITE_OTHER): Payer: PPO

## 2017-12-20 VITALS — BP 130/64 | HR 51 | Temp 98.4°F | Ht 62.0 in | Wt 190.0 lb

## 2017-12-20 DIAGNOSIS — K5520 Angiodysplasia of colon without hemorrhage: Secondary | ICD-10-CM | POA: Diagnosis not present

## 2017-12-20 DIAGNOSIS — D500 Iron deficiency anemia secondary to blood loss (chronic): Secondary | ICD-10-CM

## 2017-12-20 LAB — CBC
HCT: 24.9 % — ABNORMAL LOW (ref 36.0–46.0)
Hemoglobin: 7.8 g/dL — CL (ref 12.0–15.0)
MCHC: 31.4 g/dL (ref 30.0–36.0)
MCV: 66.4 fl — ABNORMAL LOW (ref 78.0–100.0)
Platelets: 281 10*3/uL (ref 150.0–400.0)
RBC: 3.75 Mil/uL — ABNORMAL LOW (ref 3.87–5.11)
RDW: 18.7 % — ABNORMAL HIGH (ref 11.5–15.5)
WBC: 7.7 10*3/uL (ref 4.0–10.5)

## 2017-12-20 NOTE — Addendum Note (Signed)
Addended by: Pricilla Holm A on: 12/20/2017 04:04 PM   Modules accepted: Orders

## 2017-12-20 NOTE — Patient Instructions (Signed)
Start taking iron gluconate.   We are checking the labs today for the blood levels. We are giving you the cards to check for blood in the stool.

## 2017-12-20 NOTE — Progress Notes (Signed)
   Subjective:    Patient ID: [name], female    DOB: [birth year], 67 y.o.   MRN: [id number]  HPI The patient is a 67 YO female coming in for follow up of ER visit (in for chest pain, EKG and chest x-ray okay, labs with new anemia). Last colonoscopy 2016 with a couple polyps and several AVMs, on coumadin chronically. Levels are normal at last check. She denies blood in stool or dark stool. She has started taking iron today but knows from past experience that it causes her stomach problems if she takes long term. She denies nose bleeds, bruising. She is more SOB lately and getting very fatigued. Blood flow from her legs was normal and they are having her see a neurologist.   Review of Systems  Constitutional: Positive for activity change and fatigue. Negative for appetite change, chills and unexpected weight change.  HENT: Negative.   Eyes: Negative.   Respiratory: Positive for shortness of breath. Negative for cough and chest tightness.   Cardiovascular: Negative for chest pain, palpitations and leg swelling.  Gastrointestinal: Negative for abdominal distention, abdominal pain, anal bleeding, blood in stool, constipation, diarrhea, nausea and vomiting.  Musculoskeletal: Positive for gait problem.  Skin: Negative.   Psychiatric/Behavioral: Negative.       Objective:   Physical Exam  Constitutional: She is oriented to person, place, and time. She appears well-developed and well-nourished.  HENT:  Head: Normocephalic and atraumatic.  Eyes: EOM are normal.  Neck: Normal range of motion.  Cardiovascular: Normal rate and regular rhythm.  Pulmonary/Chest: Effort normal and breath sounds normal. No respiratory distress. She has no wheezes. She has no rales.  Somewhat fatigued when minimal exertion.  Abdominal: Soft. Bowel sounds are normal. She exhibits no distension. There is no tenderness. There is no rebound.  Musculoskeletal: She exhibits no edema.  Neurological: She is alert and  oriented to person, place, and time. Coordination normal.  Skin: Skin is warm and dry.  Psychiatric: She has a normal mood and affect.   Vitals:   12/20/17 1421  BP: 130/64  Pulse: (!) 51  Temp: 98.4 F (36.9 C)  TempSrc: Oral  SpO2: 99%  Weight: 190 lb (86.2 kg)  Height: 5\' 2"  (1.575 m)      Assessment & Plan:

## 2017-12-20 NOTE — Assessment & Plan Note (Signed)
Most recent Hg 8.1 which is down significantly from prior around 12 several months ago. Rechecking today to see if this is a slow bleed or more recent. She will start taking iron gluconate to help with iron levels. Microcytic anemia. On coumadin and levels not supratherapeutic. Given stools cards to check for blood. Seen [facility] in the past. If Hg levels are stable can monitor on iron therapy, if continue to downtrend then may need transfusion and GI evaluation for bleeding.

## 2017-12-20 NOTE — Assessment & Plan Note (Signed)
Noted on last colonoscopy 3 AVMs and on chronic coumadin which makes this most likely for bleeding with recent Hg drop. Checking Hg to assess stability and need for emergent intervention.

## 2017-12-22 ENCOUNTER — Other Ambulatory Visit (HOSPITAL_COMMUNITY): Payer: Self-pay | Admitting: Nurse Practitioner

## 2017-12-22 ENCOUNTER — Other Ambulatory Visit: Payer: Self-pay | Admitting: Internal Medicine

## 2017-12-22 ENCOUNTER — Other Ambulatory Visit: Payer: Self-pay | Admitting: Cardiovascular Disease

## 2017-12-31 ENCOUNTER — Encounter: Payer: PPO | Admitting: Orthotics

## 2017-12-31 ENCOUNTER — Encounter: Payer: Self-pay | Admitting: Internal Medicine

## 2018-01-03 ENCOUNTER — Emergency Department (HOSPITAL_COMMUNITY)
Admission: EM | Admit: 2018-01-03 | Discharge: 2018-01-04 | Disposition: A | Discharge status: Home / Self Care | Payer: PPO | Attending: Emergency Medicine | Admitting: Emergency Medicine

## 2018-01-03 ENCOUNTER — Other Ambulatory Visit: Payer: Self-pay

## 2018-01-03 ENCOUNTER — Emergency Department (HOSPITAL_COMMUNITY): Payer: PPO

## 2018-01-03 ENCOUNTER — Other Ambulatory Visit (INDEPENDENT_AMBULATORY_CARE_PROVIDER_SITE_OTHER): Payer: PPO

## 2018-01-03 ENCOUNTER — Telehealth: Payer: Self-pay

## 2018-01-03 ENCOUNTER — Encounter (HOSPITAL_COMMUNITY): Payer: Self-pay | Admitting: Emergency Medicine

## 2018-01-03 DIAGNOSIS — Z87891 Personal history of nicotine dependence: Secondary | ICD-10-CM | POA: Diagnosis not present

## 2018-01-03 DIAGNOSIS — R42 Dizziness and giddiness: Secondary | ICD-10-CM | POA: Insufficient documentation

## 2018-01-03 DIAGNOSIS — K922 Gastrointestinal hemorrhage, unspecified: Secondary | ICD-10-CM | POA: Diagnosis not present

## 2018-01-03 DIAGNOSIS — D649 Anemia, unspecified: Secondary | ICD-10-CM | POA: Insufficient documentation

## 2018-01-03 DIAGNOSIS — D500 Iron deficiency anemia secondary to blood loss (chronic): Secondary | ICD-10-CM | POA: Diagnosis not present

## 2018-01-03 DIAGNOSIS — D689 Coagulation defect, unspecified: Secondary | ICD-10-CM | POA: Diagnosis not present

## 2018-01-03 DIAGNOSIS — R0602 Shortness of breath: Secondary | ICD-10-CM | POA: Diagnosis not present

## 2018-01-03 DIAGNOSIS — R531 Weakness: Secondary | ICD-10-CM | POA: Diagnosis not present

## 2018-01-03 DIAGNOSIS — Z79899 Other long term (current) drug therapy: Secondary | ICD-10-CM | POA: Insufficient documentation

## 2018-01-03 LAB — CBC
HCT: 23.4 % — CL (ref 36.0–46.0)
HCT: 23.3 % — ABNORMAL LOW (ref 36.0–46.0)
Hemoglobin: 7.2 g/dL — CL (ref 12.0–15.0)
Hemoglobin: 7.1 g/dL — ABNORMAL LOW (ref 12.0–15.0)
MCH: 20.3 pg — ABNORMAL LOW (ref 26.0–34.0)
MCHC: 31 g/dL (ref 30.0–36.0)
MCHC: 30.5 g/dL (ref 30.0–36.0)
MCV: 66.3 fl — ABNORMAL LOW (ref 78.0–100.0)
MCV: 66.8 fL — ABNORMAL LOW (ref 78.0–100.0)
Platelets: 314 10*3/uL (ref 150.0–400.0)
Platelets: 325 10*3/uL (ref 150–400)
RBC: 3.53 Mil/uL — ABNORMAL LOW (ref 3.87–5.11)
RBC: 3.49 MIL/uL — ABNORMAL LOW (ref 3.87–5.11)
RDW: 19.5 % — ABNORMAL HIGH (ref 11.5–15.5)
RDW: 18.5 % — ABNORMAL HIGH (ref 11.5–15.5)
WBC: 10.8 10*3/uL — ABNORMAL HIGH (ref 4.0–10.5)
WBC: 9 10*3/uL (ref 4.0–10.5)

## 2018-01-03 LAB — FERRITIN: Ferritin: 7 ng/mL — ABNORMAL LOW (ref 10.0–291.0)

## 2018-01-03 LAB — BASIC METABOLIC PANEL
Anion gap: 8 (ref 5–15)
BUN: 22 mg/dL — ABNORMAL HIGH (ref 6–20)
CO2: 22 mmol/L (ref 22–32)
Calcium: 8.8 mg/dL — ABNORMAL LOW (ref 8.9–10.3)
Chloride: 97 mmol/L — ABNORMAL LOW (ref 101–111)
Creatinine, Ser: 0.97 mg/dL (ref 0.44–1.00)
GFR calc Af Amer: >60 mL/min (ref >60)
GFR calc non Af Amer: 58 mL/min — ABNORMAL LOW (ref >60)
Glucose, Bld: 118 mg/dL — ABNORMAL HIGH (ref 65–99)
Potassium: 4.9 mmol/L (ref 3.5–5.1)
Sodium: 127 mmol/L — ABNORMAL LOW (ref 135–145)

## 2018-01-03 LAB — FECAL OCCULT BLOOD, IMMUNOCHEMICAL: Fecal Occult Bld: POSITIVE — AB

## 2018-01-03 LAB — PREPARE RBC (CROSSMATCH)

## 2018-01-03 LAB — CBG MONITORING, ED: Glucose-Capillary: 122 mg/dL — ABNORMAL HIGH (ref 65–99)

## 2018-01-03 LAB — VITAMIN B12: Vitamin B-12: 635 pg/mL (ref 211–911)

## 2018-01-03 NOTE — ED Triage Notes (Deleted)
Patient had fall around 6:30pm. When patient fell she hit the back of her head. Around 7 pm Patient started have trouble forming sentences. Husband brought wife in to get checked.

## 2018-01-03 NOTE — Telephone Encounter (Signed)
See result note, sent to er

## 2018-01-03 NOTE — Telephone Encounter (Signed)
CRITICAL VALUE STICKER  CRITICAL VALUE: Hgb 1st run 7.2 second run 7.3.  Hgb 23.1  RECEIVER (on-site recipient of call): carson  DATE & TIME NOTIFIED: 3:20  MESSENGER (representative from lab):  MD NOTIFIED:   TIME OF NOTIFICATION:  RESPONSE:

## 2018-01-03 NOTE — ED Triage Notes (Signed)
Patient is stating that patient hgb 7.2 and sent her to the ED. Patient is getting dizzy and breathless with walking. Patient complaining of no energy.

## 2018-01-03 NOTE — ED Notes (Signed)
Pt requested that IVs be started when blood is ready

## 2018-01-04 LAB — URINALYSIS, ROUTINE W REFLEX MICROSCOPIC
Bilirubin Urine: NEGATIVE
Glucose, UA: >=500 mg/dL — AB
Ketones, ur: NEGATIVE mg/dL
Nitrite: NEGATIVE
Protein, ur: NEGATIVE mg/dL
Specific Gravity, Urine: 1.001 — ABNORMAL LOW (ref 1.005–1.030)
pH: 5 (ref 5.0–8.0)

## 2018-01-04 LAB — ABO/RH: ABO/RH(D): A POS

## 2018-01-04 LAB — POC OCCULT BLOOD, ED: Fecal Occult Bld: POSITIVE — AB

## 2018-01-04 NOTE — ED Provider Notes (Signed)
[facility] DEPT Provider Note   CSN: [id number] Arrival date & time: 01/03/18  2103     History   Chief Complaint Chief Complaint  Patient presents with  . dr. sent over to get blood transfusion    HPI [name] is a 67 y.o. female.  Patient with history of a-fib on Xarelto, asthma, GERD, HLD, CAD, T2DM, glaucoma, presents for treatment of symptomatic anemia. She states she saw her doctor 2 weeks ago for routine labs as she does every 6 months and her hemoglobin was found to have changed from 11 6 months prior to 7.8. She went back for recheck today and it was found to have dropped to 7.3 and she is now having symptoms of lightheadedness, near syncope, weakness. No chest pain, nausea, headache. She has noticed a change in color of her stool to dark brown/black at time.   The history is provided by the patient and a significant other. No language interpreter was used.    Past Medical History:  Diagnosis Date  . Anemia, unspecified   . Arthritis   . Asthma   . Atrial fibrillation with RVR ([place])    a. on Xarelto  . Bell's palsy    Facial nerve decompression in 2001  . Chronic low back pain   . COPD with asthma ([place])   . Coronary artery disease    Myoview 04/12/11 was entirely normal. ECHO 02/26/08 showed only minor abnormalities. Stenting 05/26/08 of her posterolateral branch to the left circumflex coronary artery. Used a 2.5x68mm Taxus Monorail stent.myoview 2014 was without ischemia  . Diabetes mellitus    Type 2  . GERD (gastroesophageal reflux disease)   . Glaucoma   . Goiter   . Heart murmur   . History of nuclear stress test 2012; 2014   lexiscan; normal pattern of perfusion; normal, low risk scan   . Hyperlipidemia   . Hypertension   . Pneumonia 2008  . Polycystic ovary disease    Hysterectomy in 1982 for this  . Shortness of breath dyspnea    ECHO 02/26/08 showed only minor abnormalities  . Spinal stenosis     Patient Active  Problem List   Diagnosis Date Noted  . AVM (arteriovenous malformation) of colon 12/20/2017  . Claudication ([place]) 11/22/2017  . Anxiety 10/25/2017  . Arthritis 10/25/2017  . Chronic obstructive lung disease ([place]) 10/25/2017  . Fibrocystic disease of breast 10/25/2017  . Gastroesophageal reflux disease 10/25/2017  . Glaucoma 10/25/2017  . 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 Heart murmur 10/25/2017  . History of Bell's palsy 10/25/2017  . Insomnia 10/25/2017  . Menopausal symptom 10/25/2017  . Osteopenia 10/25/2017  . Panic attack 10/25/2017  . RLS (restless legs syndrome) 04/23/2017  . Routine general medical examination at a health care facility 11/16/2016  . Diabetic polyneuropathy ([place]) 07/30/2016  . Depression 01/04/2016  . Snoring 10/11/2015  . Obesity hypoventilation syndrome ([place]) 10/11/2015  . PAF (paroxysmal atrial fibrillation) ([place]) 12/16/2013  . CAD S/P percutaneous coronary angioplasty 04/28/2013  . Essential hypertension 05/06/2009  . LOW BACK PAIN, CHRONIC 02/12/2008  . Anemia 08/15/2007  . Type 2 diabetes mellitus with vascular disease ([place]) 01/02/2007  . Hyperlipidemia 01/02/2007  . ELEVATION, TRANSAMINASE/LDH LEVELS 01/02/2007    Past Surgical History:  Procedure Laterality Date  . ABDOMINAL HYSTERECTOMY  1982   & BSO; for polycystic ovary disease  . CARDIOVERSION N/A 12/17/2013   Procedure: CARDIOVERSION;  Surgeon: Pixie Casino, MD;  Location: [facility];  Service: Cardiovascular;  Laterality: N/A;  . CARDIOVERSION N/A 09/30/2015   Procedure: CARDIOVERSION;  Surgeon: Dorothy Spark, MD;  Location: [facility] ENDOSCOPY;  Service: Cardiovascular;  Laterality: N/A;  . CARDIOVERSION N/A 06/22/2016   Procedure: CARDIOVERSION;  Surgeon: Skeet Latch, MD;  Location: [facility];  Service: Cardiovascular;  Laterality: N/A;  . COLONOSCOPY     last 2009; Dr Cristina Gong; due 2019  . CORONARY ANGIOPLASTY  05/26/2008   Stenting of her posterolateral branch to the left  circumflex coronary artery. Used a 2.5x49mm Taxus Monorail stent.  [name] FACIAL NERVE DECOMPRESSION  2001/2002   bells palsy   . LAPAROSCOPIC CHOLECYSTECTOMY  06/15/2011    Dr Dalbert Batman  . TEE WITHOUT CARDIOVERSION N/A 12/17/2013   Procedure: TRANSESOPHAGEAL ECHOCARDIOGRAM (TEE);  Surgeon: Pixie Casino, MD;  Location[facility] ENDOSCOPY;  Service: Cardiovascular;  Laterality: N/A;  trish/ja  . TRANSTHORACIC ECHOCARDIOGRAM  0/25/4270   LV systolic function normal with mild conc LVH; LA mildly dilated; trace MR/TR  . UPPER GI ENDOSCOPY  2009   negative     OB History   None      Home Medications    Prior to Admission medications   Medication Sig Start Date End Date Taking? Authorizing Provider  ascorbic acid (VITAMIN C) 1000 MG tablet Take 1,000 mg by mouth 2 (two) times daily.     [provider]  budesonide-formoterol (SYMBICORT) 160-4.5 MCG/ACT inhaler USE 2 PUFFS TWICE DAILY 10/28/17   Hoyt Koch, MD  CALCIUM-MAG-VIT C-VIT D PO Take 2 tablets by mouth daily.     [provider]  celecoxib (CELEBREX) 200 MG capsule Take 200 mg by mouth 2 (two) times daily.  12/30/13   [provider]  Continuous Blood Gluc Sensor ([place]) MISC Use to monitor sugars 04/23/17   Hoyt Koch, MD  CRESTOR 10 MG tablet TAKE ONE TABLET AT BEDTIME. 10/07/17   Lorretta Harp, MD  diltiazem (CARDIZEM CD) 180 MG 24 hr capsule TAKE 1 CAPSULE DAILY. 09/13/17   Lorretta Harp, MD  estradiol (ESTRACE) 0.5 MG tablet Take 0.5 mg by mouth daily.  04/23/11   [provider]  gabapentin (NEURONTIN) 100 MG capsule Take 1 capsule (100 mg total) by mouth 3 (three) times daily. 07/19/17   Hoyt Koch, MD  guaiFENesin (MUCINEX) 600 MG 12 hr tablet Take 600 mg by mouth 2 (two) times daily.    [provider]  HYDROcodone-acetaminophen (NORCO/VICODIN) 5-325 MG tablet Take 1 tablet by mouth.  06/14/16   [provider]  INVOKANA 100  MG TABS tablet TAKE 1 TABLET ONCE DAILY. 12/23/17   Hoyt Koch, MD  isosorbide mononitrate (IMDUR) 30 MG 24 hr tablet TAKE 1 TABLET ONCE DAILY. 05/22/17   Lorretta Harp, MD  latanoprost (XALATAN) 0.005 % ophthalmic solution Place 1 drop into both eyes at bedtime. 12/09/13   [provider]  levalbuterol Penne Lash) 0.63 MG/3ML nebulizer solution Take 3 mLs (0.63 mg total) by nebulization every 6 (six) hours as needed for wheezing or shortness of breath. 07/02/16   Shawnee Knapp, MD  LORazepam (ATIVAN) 0.5 MG tablet Take 1-2 tablets (0.5-1 mg total) by mouth at bedtime. 07/19/17   Hoyt Koch, MD  Melatonin 5 MG TABS Take 1 tablet by mouth at bedtime.     [provider]  metoprolol succinate (TOPROL-XL) 25 MG 24 hr tablet TAKE ONE TABLET AT BEDTIME. 12/23/17   Sherran Needs, NP  Multiple Vitamins-Iron (MULTIVITAMINS WITH IRON) TABS Take  1 tablet by mouth 2 (two) times daily.     [provider]  nystatin ointment (MYCOSTATIN)  07/09/16   [provider]  omega-3 acid ethyl esters (LOVAZA) 1 G capsule Take 1 g by mouth daily.     [provider]  omeprazole (PRILOSEC) 20 MG capsule Take 20 mg by mouth daily.     [provider]  [facility] DELICA LANCETS FINE MISC 1 Units by Does not apply route as directed. 10/05/11   Midge Minium, MD  PARoxetine (PAXIL) 10 MG tablet Take 2.5 tablets (25 mg total) by mouth daily. 07/19/17   Hoyt Koch, MD  quinapril (ACCUPRIL) 40 MG tablet TAKE 1 TABLET ONCE DAILY. 12/23/17   Hoyt Koch, MD  rOPINIRole (REQUIP) 0.5 MG tablet Take 1-2 tablets (0.5-1 mg total) by mouth at bedtime. 07/19/17   Hoyt Koch, MD  senna (SENOKOT) 8.6 MG TABS Take 1 tablet by mouth daily.     [provider]  sitaGLIPtin-metformin (JANUMET) 50-1000 MG tablet TAKE 1/2 TABLET IN THE MORNING WITH BREAKFAST AND 1 TABLET IN THE EVENING WITH SUPPER. 07/19/17   Hoyt Koch, MD   XARELTO 20 MG TABS tablet TAKE 1 TABLET ONCE DAILY WITH SUPPER. 12/23/17   Lorretta Harp, MD  XOPENEX HFA 45 MCG/ACT inhaler USE 1 TO 2 PUFFS EVERY 6 HOURS AS NEEDED FOR WHEEZING. 03/23/16   Hoyt Koch, MD    Family History Family History  Problem Relation Age of Onset  . Heart attack Father 27       2nd MI at 33  . Colon cancer Brother 51  . Gout Brother   . Ulcers Mother   . Emphysema Mother 50  . Colon polyps Sister   . Cancer Sister        Basal cell carcinoma  . Pneumonia Maternal Grandmother   . Hypertension Brother   . Hyperlipidemia Brother   . Cancer Brother        Skin  . Diabetes Neg Hx   . Stroke Neg Hx     Social History Social History   Tobacco Use  . Smoking status: Former Smoker    Packs/day: 1.50    Years: 30.00    Pack years: 45.00    Types: Cigarettes    Last attempt to quit: 08/13/1993    Years since quitting: 24.4  . Smokeless tobacco: Never Used  Substance Use Topics  . Alcohol use: No  . Drug use: No     Allergies   Benadryl [diphenhydramine hcl]; Clopidogrel bisulfate; and Contrast media [iodinated diagnostic agents]   Review of Systems Review of Systems  Constitutional: Negative for chills and fever.  HENT: Negative.   Respiratory: Negative.   Cardiovascular: Negative.   Gastrointestinal: Negative.   Musculoskeletal: Negative.   Skin: Negative.   Neurological: Positive for weakness and light-headedness. Negative for syncope.     Physical Exam Updated Vital Signs BP 139/62 (BP Location: Left Arm)   Pulse 70   Temp 97.6 F (36.4 C) (Oral)   Resp 18   Ht 5\' 3"  (1.6 m)   Wt 77.6 kg (171 lb)   SpO2 100%   BMI 30.29 kg/m   Physical Exam  Constitutional: She is oriented to person, place, and time. She appears well-developed and well-nourished.  HENT:  Head: Normocephalic.  Eyes:  Conjunctival pallor  Neck: Normal range of motion. Neck supple.  Cardiovascular: Normal rate and regular rhythm.  No murmur  heard. Pulmonary/Chest:  Effort normal and breath sounds normal. She has no wheezes. She has no rales.  Abdominal: Soft. Bowel sounds are normal. There is no tenderness. There is no rebound and no guarding.  Musculoskeletal: Normal range of motion.  Neurological: She is alert and oriented to person, place, and time.  Skin: Skin is warm and dry. No rash noted.  Psychiatric: She has a normal mood and affect.  Nursing note and vitals reviewed.    ED Treatments / Results  Labs (all labs ordered are listed, but only abnormal results are displayed) Labs Reviewed  BASIC METABOLIC PANEL - Abnormal; Notable for the following components:      Result Value   Sodium 127 (*)    Chloride 97 (*)    Glucose, Bld 118 (*)    BUN 22 (*)    Calcium 8.8 (*)    GFR calc non Af Amer 58 (*)    All other components within normal limits  CBC - Abnormal; Notable for the following components:   RBC 3.49 (*)    Hemoglobin 7.1 (*)    HCT 23.3 (*)    MCV 66.8 (*)    MCH 20.3 (*)    RDW 18.5 (*)    All other components within normal limits  URINALYSIS, ROUTINE W REFLEX MICROSCOPIC - Abnormal; Notable for the following components:   Color, Urine STRAW (*)    Specific Gravity, Urine 1.001 (*)    Glucose, UA >=500 (*)    Hgb urine dipstick SMALL (*)    Leukocytes, UA MODERATE (*)    Bacteria, UA MANY (*)    All other components within normal limits  CBG MONITORING, ED - Abnormal; Notable for the following components:   Glucose-Capillary 122 (*)    All other components within normal limits  POC OCCULT BLOOD, ED  TYPE AND SCREEN  PREPARE RBC (CROSSMATCH)  ABO/RH    EKG None  Radiology Ct Head Wo Contrast  Result Date: 01/03/2018 CLINICAL DATA:  Anemia, dizziness and shortness of breath. EXAM: CT HEAD WITHOUT CONTRAST TECHNIQUE: Contiguous axial images were obtained from the base of the skull through the vertex without intravenous contrast. COMPARISON:  12/09/2004 FINDINGS: Brain: There is no mass,  hemorrhage or extra-axial collection. No evidence of acute cortical infarct. The size and configuration of the ventricles and extra-axial CSF spaces are normal. The brain parenchyma is normal. Vascular: No hyperdense vessel or unexpected vascular calcification. Skull: Normal visualized skull base, calvarium and extracranial soft tissues. Sinuses/Orbits: No fluid levels or advanced mucosal thickening of the visualized paranasal sinuses. No mastoid or middle ear effusion. The orbits are normal. IMPRESSION: Normal aging brain.  No acute abnormality. Electronically Signed   By: Ulyses Jarred M.D.   On: 01/03/2018 23:02    Procedures Procedures (including critical care time) CRITICAL CARE Performed by: Dewaine Oats   Total critical care time: 45 minutes  Critical care time was exclusive of separately billable procedures and treating other patients.  Critical care was necessary to treat or prevent imminent or life-threatening deterioration.  Critical care was time spent personally by me on the following activities: development of treatment plan with patient and/or surrogate as well as nursing, discussions with consultants, evaluation of patient's response to treatment, examination of patient, obtaining history from patient or surrogate, ordering and performing treatments and interventions, ordering and review of laboratory studies, ordering and review of radiographic studies, pulse oximetry and re-evaluation of patient's condition.  Medications Ordered in ED Medications - No data to display  Initial Impression / Assessment and Plan / ED Course  I have reviewed the triage vital signs and the nursing notes.  Pertinent labs & imaging results that were available during my care of the patient were reviewed by me and considered in my medical decision making (see chart for details).     Patient presents on the advice of her 89 office for consideration of transfusion for symptomatic  anemia. She was found to have a hgb of 7.8 two weeks ago that was 7.3 todsy on recheck. She reports symptoms of lightheadedness, near syncope started today. No vomiting.  The patient is found to be guaiac positive, on Xarelto for chronic a-fib.  A 2-unit transfusion was initiated and completed in the ED. She ambulates now with no symptoms of fatigue, lightheadedness or faintness.   It was recommended that she stay in the hospital for further management to insure she does not decline secondary to active GI bleeding while anticoagulated, however, the patient adamantly declines admission stating she will go to be seen on Tuesday (Monday is a holiday) by Dr. Cristina Gong. She was asked on multiple occasions if she would stay and she continues to be determined to be discharged home.   VSS. She will be discharged home with strict return precautions, to follow up with Dr. Cristina Gong on Tuesday. She is felt reliable for follow up and return to hospital if symptoms warrant.   Final Clinical Impressions(s) / ED Diagnoses   Final diagnoses:  None   1. GI bleeding 2. Coagulopathy 3. Anemia requiring transfusion  ED Discharge Orders    None       Charlann Lange, PA-C 01/04/18 0404    Molpus, Jenny Reichmann, MD 01/04/18 [phone]

## 2018-01-06 LAB — TYPE AND SCREEN
ABO/RH(D): A POS
Antibody Screen: NEGATIVE
Unit division: 0
Unit division: 0

## 2018-01-06 LAB — BPAM RBC
Blood Product Expiration Date: 201906062359
Blood Product Expiration Date: 201906062359
ISSUE DATE / TIME: 201905250013
ISSUE DATE / TIME: 201905250203
Unit Type and Rh: 6200
Unit Type and Rh: 6200

## 2018-01-07 ENCOUNTER — Encounter: Payer: PPO | Admitting: Orthotics

## 2018-01-07 ENCOUNTER — Other Ambulatory Visit: Payer: Self-pay | Admitting: Gastroenterology

## 2018-01-07 ENCOUNTER — Other Ambulatory Visit: Payer: Self-pay | Admitting: Cardiovascular Disease

## 2018-01-07 DIAGNOSIS — R195 Other fecal abnormalities: Secondary | ICD-10-CM | POA: Diagnosis not present

## 2018-01-07 DIAGNOSIS — D500 Iron deficiency anemia secondary to blood loss (chronic): Secondary | ICD-10-CM | POA: Diagnosis not present

## 2018-01-08 ENCOUNTER — Encounter: Payer: Self-pay | Admitting: Internal Medicine

## 2018-01-08 DIAGNOSIS — M6281 Muscle weakness (generalized): Secondary | ICD-10-CM | POA: Diagnosis not present

## 2018-01-10 ENCOUNTER — Other Ambulatory Visit: Payer: Self-pay | Admitting: Cardiovascular Disease

## 2018-01-10 NOTE — Telephone Encounter (Signed)
REFILL 

## 2018-01-20 ENCOUNTER — Telehealth: Payer: Self-pay | Admitting: *Deleted

## 2018-01-20 NOTE — Telephone Encounter (Signed)
Pt takes Xarelto for afib with CHADS2VASc score of 5 (age, sex, DM, HTN, CAD). Renal function is normal. Ok to hold Xarelto the day before procedure as requested. Aspirin clearance to be addressed by MD or pre op pool.

## 2018-01-20 NOTE — Telephone Encounter (Signed)
   [facility] Pre-operative Risk Assessment    Request for surgical clearance:  1. What type of surgery is being performed? COLONOSCOPY AND ENDOSCOPY   2. When is this surgery scheduled? 01-23-2018   3. What type of clearance is required (medical clearance vs. Pharmacy clearance to hold med vs. Both)? BOTH  4. Are there any medications that need to be held prior to surgery and how long?XARELTO-THE NIGHT PRIOR TO THE PROCEDURE  5. ASPIRIN-STOP 5 DAYS PRIOR TO PROCEDURE   6. Practice name and name of physician performing surgery? DR BUCCINI   7. What is your office phone number [phone]    7.   What is your office fax number [phone]  8.   Anesthesia type (None, local, MAC, general) ? NOT LISTED   Fredia Beets 01/20/2018, 4:33 PM  _________________________________________________________________   (provider comments below)

## 2018-01-21 NOTE — Telephone Encounter (Signed)
   Primary Cardiologist: Dr. Gwenlyn Found   Chart reviewed as part of pre-operative protocol coverage. Patient was contacted 01/21/2018 in reference to pre-operative risk assessment for pending surgery as outlined below.  [name] was last seen on 12/11/17 by Dr. Gwenlyn Found.  Since that day, [name] has done done well from a cardiac standpoint w/o any CP or dyspnea.  Therefore, based on ACC/AHA guidelines, the patient would be at acceptable risk for the planned procedure without further cardiovascular testing.   Per pharmacy recommendations, Ok to hold Xarelto the day before procedure as requested.   Would prefer to continue ASA but can be held if absolutely necessary.   I will route this recommendation to the requesting party via Epic fax function and remove from pre-op pool.  Please call with questions.  Lyda Jester, PA-C 01/21/2018, 1:36 PM

## 2018-01-22 ENCOUNTER — Ambulatory Visit (INDEPENDENT_AMBULATORY_CARE_PROVIDER_SITE_OTHER): Payer: PPO | Admitting: Neurology

## 2018-01-22 ENCOUNTER — Other Ambulatory Visit: Payer: Self-pay

## 2018-01-22 ENCOUNTER — Encounter (HOSPITAL_COMMUNITY): Payer: Self-pay | Admitting: *Deleted

## 2018-01-22 ENCOUNTER — Encounter: Payer: Self-pay | Admitting: Neurology

## 2018-01-22 VITALS — BP 131/63 | HR 90 | Ht 62.0 in | Wt 192.0 lb

## 2018-01-22 DIAGNOSIS — D500 Iron deficiency anemia secondary to blood loss (chronic): Secondary | ICD-10-CM | POA: Diagnosis not present

## 2018-01-22 DIAGNOSIS — G4761 Periodic limb movement disorder: Secondary | ICD-10-CM

## 2018-01-22 DIAGNOSIS — Z7901 Long term (current) use of anticoagulants: Secondary | ICD-10-CM

## 2018-01-22 DIAGNOSIS — G2581 Restless legs syndrome: Secondary | ICD-10-CM | POA: Diagnosis not present

## 2018-01-22 MED ORDER — ROPINIROLE HCL ER 2 MG PO TB24: 2.0000 mg | ORAL_TABLET | Freq: Every day | ORAL | 5 refills | Status: DC

## 2018-01-22 MED ORDER — ROPINIROLE HCL 0.5 MG PO TABS: ORAL_TABLET | ORAL | 6 refills | Status: DC

## 2018-01-22 NOTE — Progress Notes (Signed)
SLEEP MEDICINE CLINIC   Provider:  Larey Seat, M D  Referring Provider: Hoyt Koch, * Primary Care Physician:  Hoyt Koch, MD  Chief Complaint  Patient presents with  . Follow-up    pt with spouse, rm 22. pt states that she has had new symptoms. she has new bilateral leg weakness, steps are hard for her, and she can become short of breath, poor balance. she has been having dizziness and lightheadness during this time her RLS. this has all been going on since December. at this time they found our her HGB was dropping and end of the may she receieved blood transfusions and she felt better and improved her symptoms. no labs post transfusion. has apt for tom to assess GI     HPI: 06-10-2017, I had the opportunity of meeting [name] on February 28 last year, about 20 months ago. In the meantime she would like to be further evaluated a past medical history has not significantly changed. Her chief complaints of neither. She continues to be treated on Xarelto for atrial fibrillation with rapid ventricular response, takes Cardizem for rate control, gabapentin, estradiol, Invokana, Symbicort inhaler, Janumet, she still taking Paxil, but she is no longer taking Ultram.  She is still in need of a formal sleep evaluation, her atrial fibrillation has been difficult to control. She has failed 4 cardio-versions. Could not take Tikosyn as it interferes with her paxil.  insisted on continuing Paxil, out of fear to have panic attacks again. 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 She lives with her wife, has a supportive sister. She adopted 2 children.      HPI:  [name] is a 67 y.o. female , seen here as a referral from Dr. Sharlet Salina for a sleep consultation,   Chief complaint according to patient : " insomnia- difficulties to go to sleep , but I can stay asleep" . 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  Mrs. Doutt is a patient with  atrial fibrillation and sleep initiation insomnia, she also had a Bell's palsy in 2001, a cholecystectomy in 2012 cardiac stenting in 2009 and a hysterectomy in 1982. Her past medical history includes hypertension, diabetes, hypercholesterolemia, heart disease atrial fibrillation, anxiety and migraine. In her review of systems she endorsed further easy bruising, shortness of breath, chest pain and palpitation being aware of a cardiac murmur, restless legs and headaches. It was upon further recommendation of her cardiologist at the atrial fibrillation clinic at [facility] where she was recommended to undergo a sleep study. The patient was educated about the correlation between atrial fibrillation and obstructive sleep apnea. She sees Dr. Quay Burow. She has a panic disorder and takes Ativan.   Paroxysmal atrial fibrillation was first diagnosed in May 2015 with a successful conversion 2 first of May of the same year and after that she relapsed into a fib again in September of the same year. She then suffered in early 2017 recurrent episode and her cardiologist increased her metoprolol 250 minute times a day to control the rate. She underwent another cardioversion in feb 2017 ,  converted on the dose and has been maintaining sinus rhythm for one week.  Sleep habits are as follows: She takes Lorazepam at night and melatonin. She sleeps on her right side. She wakes up prone. Bedroom is cold, quiet and dark.  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  Working 2- 8 PM, late dinner, reading. And going to bed after midnight after some " me time" . She sleeps 7-8 hours and often rises at noon. She sleeps with her spouse, she reportedly kicks her wife a lot.  She often wakes with palpitations and noted a very high blood pressure. She has woken diaphoretic.  Her spouse wakes up "a zillion times" but can go to sleep. Delayed sleep cycle for years . On weekends she sleeps  10-12 hours , 4 AM  until 2 PM. She does not nap in daytime. She does drink coffee after waking up and after work (!).  She wakes with a dry mouth and no headaches.  Sleep medical history and family sleep history: her sister has hypersomnia. Social history:  Married to same sex partner,  2 daughters 32 and 1. No ETOH, No tobacco, little  caffeine.    01-22-2018, I have the pleasure of seeing [name] and [name] today patient I had last encounter prior to scheduling a sleep study for her.  She has a history of migraine, persistent atrial fibrillation, coronary artery disease with cardiac stents, obesity, diabetes and hypertension.  She was referred by cardiologist Dr. Alvester Chou and primary care physician Pricilla Holm at the time.  Her sleep study revealed no significant apnea, mild snoring moderate snoring when supine, persistent atrial fibrillation, and severe periodic limb movements with related arousals at 14.6 times per hour of sleep.  I felt strongly that her PLM's are the main culprit also.  Periodic limb movements can be related to medication side effects but my main suspicion was that we need to check for her spinal anatomy, any neuropathy or any iron deficiency.  A ferritin level had to be obtained which was reportedly  very low and the patient was finally treated for iron deficiency and anemia.  In the meantime before the iron deficiency was evident she had seen Dr. Lynann Bologna,  who followed up with her recently  April 2019 and stated that she had no explanation for the progressive balance loss and pain in the extremities and could not correlate this quite to the mild-spinal stenosis as found between L5 and S1.   The patient states that after she was transfused with 2 units her symptoms improved drastically but now are slowly coming back.  She is scheduled for an upper and lower GI endoscopy tomorrow.  This is meant to discover were blood may be lost. There is blood is in her stool. Dr Wallis Mart had  seen her 3 years ago and warned about Gi Bleed. Iron deficiency caused severe RLS, and now she can sit still for a while.  On Requip.    Review of Systems: Out of a complete 14 system review, the patient complains of only the following symptoms, and all other reviewed systems are negative.  Improved RLS, sleeps only 5-7 hours, and later in daytime.  She l has not slept some night.  FSS  29 down from 61,  Epworth remained at  7 points.  Geriatric depression score 2   Social History   Socioeconomic History  . Marital status: Married    Spouse name: Not on file  . Number of children: 2  . Years of education: master's  . Highest education level: Not on file  Occupational History  . Occupation: Forensic scientist: [employer]  . Financial resource strain: Not on file  . Food insecurity:    Worry: Not  on file    Inability: Not on file  . Transportation needs:    Medical: Not on file    Non-medical: Not on file  Tobacco Use  . Smoking status: Former Smoker    Packs/day: 1.50    Years: 30.00    Pack years: 45.00    Types: Cigarettes    Last attempt to quit: 08/13/1993    Years since quitting: 24.4  . Smokeless tobacco: Never Used  Substance and Sexual Activity  . Alcohol use: No  . Drug use: No  . Sexual activity: Not on file  Lifestyle  . Physical activity:    Days per week: Not on file    Minutes per session: Not on file  . Stress: Not on file  Relationships  . Social connections:    Talks on phone: Not on file    Gets together: Not on file    Attends religious service: Not on file    Active member of club or organization: Not on file    Attends meetings of clubs or organizations: Not on file    Relationship status: Not on file  . Intimate partner violence:    Fear of current or ex partner: Not on file    Emotionally abused: Not on file    Physically abused: Not on file    Forced sexual activity: Not on file  Other Topics  Concern  . Not on file  Social History Narrative  . Not on file    Family History  Problem Relation Age of Onset  . Heart attack Father 41       2nd MI at 59  . Colon cancer Brother 62  . Gout Brother   . Ulcers Mother   . Emphysema Mother 6  . Colon polyps Sister   . Cancer Sister        Basal cell carcinoma  . Pneumonia Maternal Grandmother   . Hypertension Brother   . Hyperlipidemia Brother   . Cancer Brother        Skin  . Diabetes Neg Hx   . Stroke Neg Hx     Past Medical History:  Diagnosis Date  . Anemia, unspecified   . Arthritis   . Asthma   . Atrial fibrillation with RVR ([place])    a. on Xarelto  . Bell's palsy    Facial nerve decompression in 2001  . Chronic low back pain   . COPD with asthma ([place])   . Coronary artery disease    Myoview 04/12/11 was entirely normal. ECHO 02/26/08 showed only minor abnormalities. Stenting 05/26/08 of her posterolateral branch to the left circumflex coronary artery. Used a 2.5x2mm Taxus Monorail stent.myoview 2014 was without ischemia  . Diabetes mellitus    Type 2  . GERD (gastroesophageal reflux disease)   . Glaucoma   . Goiter   . Heart murmur   . History of nuclear stress test 2012; 2014   lexiscan; normal pattern of perfusion; normal, low risk scan   . Hyperlipidemia   . Hypertension   . Pneumonia 2008  . Polycystic ovary disease    Hysterectomy in 1982 for this  . Shortness of breath dyspnea    ECHO 02/26/08 showed only minor abnormalities  . Spinal stenosis     Past Surgical History:  Procedure Laterality Date  . ABDOMINAL HYSTERECTOMY  1982   & BSO; for polycystic ovary disease  . CARDIOVERSION N/A 12/17/2013   Procedure: CARDIOVERSION;  Surgeon: Pixie Casino, MD;  Location: [place];  Service: Cardiovascular;  Laterality: N/A;  . CARDIOVERSION N/A 09/30/2015   Procedure: CARDIOVERSION;  Surgeon: Dorothy Spark, MD;  Location: [facility] ENDOSCOPY;  Service: Cardiovascular;  Laterality: N/A;  .  CARDIOVERSION N/A 06/22/2016   Procedure: CARDIOVERSION;  Surgeon: Skeet Latch, MD;  Location: [place];  Service: Cardiovascular;  Laterality: N/A;  . COLONOSCOPY     last 2009; Dr Cristina Gong; due 2019  . CORONARY ANGIOPLASTY  05/26/2008   Stenting of her posterolateral branch to the left circumflex coronary artery. Used a 2.5x83mm Taxus Monorail stent.  [name] FACIAL NERVE DECOMPRESSION  2001/2002   bells palsy   . LAPAROSCOPIC CHOLECYSTECTOMY  06/15/2011    Dr Dalbert Batman  . TEE WITHOUT CARDIOVERSION N/A 12/17/2013   Procedure: TRANSESOPHAGEAL ECHOCARDIOGRAM (TEE);  Surgeon: Pixie Casino, MD;  Location: [facility] ENDOSCOPY;  Service: Cardiovascular;  Laterality: N/A;  trish/ja  . TRANSTHORACIC ECHOCARDIOGRAM  6/78/9381   LV systolic function normal with mild conc LVH; LA mildly dilated; trace MR/TR  . UPPER GI ENDOSCOPY  2009   negative    Current Outpatient Medications  Medication Sig Dispense Refill  . ascorbic acid (VITAMIN C) 1000 MG tablet Take 1,000 mg by mouth 2 (two) times daily.     [name] aspirin 325 MG tablet Take 325 mg by mouth at bedtime.    . budesonide-formoterol (SYMBICORT) 160-4.5 MCG/ACT inhaler USE 2 PUFFS TWICE DAILY (Patient taking differently: Inhale 2 puffs into the lungs 2 (two) times daily as needed (asthma). USE 2 PUFFS TWICE DAILY AS NEEDED) 10.2 g 4  . CALCIUM-MAG-VIT C-VIT D PO Take 1 tablet by mouth daily.     . celecoxib (CELEBREX) 200 MG capsule Take 200 mg by mouth 2 (two) times daily.     . Continuous Blood Gluc Sensor (FREESTYLE LIBRE SENSOR SYSTEM) MISC Use to monitor sugars 1 each 0  . CRESTOR 10 MG tablet TAKE ONE TABLET AT BEDTIME. 30 tablet 6  . diltiazem (CARDIZEM CD) 180 MG 24 hr capsule TAKE 1 CAPSULE DAILY. 90 capsule 3  . estradiol (ESTRACE) 0.5 MG tablet Take 0.5 mg by mouth daily.     [name] gabapentin (NEURONTIN) 100 MG capsule Take 1 capsule (100 mg total) by mouth 3 (three) times daily. 180 capsule 6  . guaiFENesin (MUCINEX) 600 MG 12 hr tablet Take 600  mg by mouth 2 (two) times daily.    [name] HYDROcodone-acetaminophen (NORCO/VICODIN) 5-325 MG tablet Take 1 tablet by mouth daily as needed for moderate pain.   0  . INVOKANA 100 MG TABS tablet TAKE 1 TABLET ONCE DAILY. 30 tablet 2  . isosorbide mononitrate (IMDUR) 30 MG 24 hr tablet Take 15 mg by mouth daily.    [name] latanoprost (XALATAN) 0.005 % ophthalmic solution Place 1 drop into both eyes at bedtime.    . levalbuterol (XOPENEX) 0.63 MG/3ML nebulizer solution Take 3 mLs (0.63 mg total) by nebulization every 6 (six) hours as needed for wheezing or shortness of breath. 75 mL 1  . LORazepam (ATIVAN) 0.5 MG tablet Take 1-2 tablets (0.5-1 mg total) by mouth at bedtime. 60 tablet 5  . Melatonin 5 MG TABS Take 1 tablet by mouth at bedtime.     . metoprolol succinate (TOPROL-XL) 25 MG 24 hr tablet Take 25 mg by mouth daily as needed (AFIB).    [name] metoprolol succinate (TOPROL-XL) 50 MG 24 hr tablet Take 150 mg by mouth every morning. Take with or immediately following a meal.    . Multiple Vitamins-Iron (MULTIVITAMINS  WITH IRON) TABS Take 1 tablet by mouth 2 (two) times daily.     [name] nystatin ointment (MYCOSTATIN) Apply 1 application topically 2 (two) times daily as needed (IRRITATION).   0  . omega-3 acid ethyl esters (LOVAZA) 1 G capsule Take 1 g by mouth daily.     [name] omeprazole (PRILOSEC) 20 MG capsule Take 20 mg by mouth daily.     Janice Rosebush DELICA LANCETS FINE MISC 1 Units by Does not apply route as directed. 100 each 3  . PARoxetine (PAXIL) 10 MG tablet Take 2.5 tablets (25 mg total) by mouth daily. 225 tablet 11  . quinapril (ACCUPRIL) 40 MG tablet TAKE 1 TABLET ONCE DAILY. 90 tablet 1  . rOPINIRole (REQUIP) 0.5 MG tablet Take 1-2 tablets (0.5-1 mg total) by mouth at bedtime. (Patient taking differently: Take 1 mg by mouth at bedtime. ) 60 tablet 6  . senna (SENOKOT) 8.6 MG TABS Take 1 tablet by mouth daily.     . sitaGLIPtin-metformin (JANUMET) 50-1000 MG tablet TAKE 1/2 TABLET IN THE MORNING WITH  BREAKFAST AND 1 TABLET IN THE EVENING WITH SUPPER. (Patient taking differently: Take 0.5-1 tablets by mouth See admin instructions. TAKE 1/2 TABLET IN THE MORNING WITH BREAKFAST AND 1 TABLET IN THE EVENING WITH SUPPER.) 140 tablet 6  . XARELTO 20 MG TABS tablet TAKE 1 TABLET ONCE DAILY WITH SUPPER. 30 tablet 5  . XOPENEX HFA 45 MCG/ACT inhaler USE 1 TO 2 PUFFS EVERY 6 HOURS AS NEEDED FOR WHEEZING. 15 g 0   No current facility-administered medications for this visit.     Allergies as of 01/22/2018 - Review Complete 01/22/2018  Allergen Reaction Noted  . Benadryl [diphenhydramine hcl] Other (See Comments) 06/15/2011  . Clopidogrel bisulfate Nausea Only   . Contrast media [iodinated diagnostic agents] Palpitations 06/15/2011    Vitals: BP 131/63   Pulse 90   Ht 5\' 2"  (1.575 m)   Wt 192 lb (87.1 kg)   BMI 35.12 kg/m  Last Weight:  Wt Readings from Last 1 Encounters:  01/22/18 192 lb (87.1 kg)   ZDG:UYQI mass index is 35.12 kg/m.     Last Height:   Ht Readings from Last 1 Encounters:  01/22/18 5\' 2"  (1.575 m)    Physical exam:  General: The patient is awake, alert and appears not in acute distress. The patient is obese. Head: Normocephalic, atraumatic. Neck is supple. Mallampati 3  neck circumference:16.25 . Nasal airflow intact , TMJ is evident . Retrognathia is seen.  Cardiovascular:  irregular rate and rhythm , without  murmurs or carotid bruit, and without distended neck veins. Respiratory: Lungs are clear to auscultation. Skin:  Without evidence of edema, or rash Trunk: BMI is elevated . The patient's posture is stooped Neurologic exam : The patient is awake and alert, oriented to place and time.   She is very pale.  Memory subjective  described as intact. Attention span & concentration ability appears normal.  Speech is fluent, without dysarthria, dysphonia or aphasia.  Mood and affect are appropriate.  Cranial nerves: Pupils are equal and briskly reactive to light.  Funduscopic exam deferred. Extraocular movements in vertical and horizontal planes intact and without nystagmus. Visual fields by finger perimetry are intact.Hearing to finger rub intact. Facial sensation intact to fine touch. Facial motor strength is symmetric and tongue and uvula move midline. Shoulder shrug was symmetrical.  Motor exam:  Normal tone, muscle bulk and symmetric strength in all extremities. Sensory:  Fine touch, pinprick and  vibration in the upper extremities was normal. Coordination: Finger-to-nose maneuver  without evidence of ataxia, dysmetria or tremor. Gait and station: Patient walks without assistive device. Strength within normal limits.Stance is stable and normal.Tandem gait is unfragmented. Turns with 3 Steps.  Deep tendon reflexes: in the upper and lower extremities are symmetric and intact. Babinski maneuver response is downgoing.  The patient was advised of the nature of the diagnosed sleep disorder , the treatment options and risks for general a health and wellness arising from not treating the condition.  I spent more than 30 minutes of face to face time with the patient. Greater than 50% of time was spent in counseling and coordination of care. We have discussed the diagnosis and differential and I answered the patient's questions.     Assessment:  After physical and neurologic examination, review of laboratory studies,  Personal review of imaging studies, reports of other /same  Imaging studies ,  Results of polysomnography/ neurophysiology testing and pre-existing records as far as provided in visit., my assessment is   1)The patient has been told by her daughters that she snores and that she has crescendo snoring, based on her neck circumference and her BMI she is at a higher risk of having obstructive sleep apnea. This was not confirmed. PLMs were seen- and seemingly related to iron deficiency, anemia, chronic blood loss from lower GI tract.  2) Atrial fibrillation,  persistent  -She cannot undergo ablation while in almost constant atrial fibrillation- and her a fib has taken her energy away, her stamina.  3) Obesity. Weights 194 - 188 pounds -her BMI is still elevated for this reason we will order a split night polysomnography with capnography and if the patient does not have apnea but remains hypoxemic for over 60 minutes total time I want her  to be titrated to oxygen.  4)  PLMs, RLS -  Can be partly due to spinal stenosis, but she responded well to Requip and transfusions.   5) delayed sleep rhythm, circadian shift - still asking for a daytime sleep study.   = patient sleeps , later and later and longer into the day. Has been asked to adjust her sleep routine.    Plan:  Treatment plan and additional workup :  The patient works better than she can lean on a shopping cart, in my book this means that she would benefit from a walker which she may not like.  This will stabilize gait.  Requip in a generic form twice a day immediate release has helped but has not lost its.  I will change this medication to an extended release form.  She would have an extended release dose late afternoon early early evening hours, she will still have immediate release for immediate relief if needed.  I hope that tomorrow's GI evaluation will show her where her blood loss is coming from,  Low ferritin under 50 ng, total iron capacity and free iron have all been abnormal in the past and she has developed anemia.  This is 1 of the most common risk factors to develop restless legs and PLM's.  That she had such a relief after a blood transfusion tells me that this is the origin of the problem.  Asencion Partridge Makylie Rivere MD  01/22/2018   CC: Hoyt Koch, Md [place], [place] [id number]

## 2018-01-22 NOTE — Progress Notes (Signed)
Pt denies any acute cardiopulmonary issues. Pt under the care of Dr.Berry, Cardiology. Pt stated that she was instructed to hold her Xarelto tonight. Pt stated that she was instructed to hold diabetes medications today and the morning of surgery. Pt made aware to stop taking vitamins, fish oil, Melatonin and herbal medications. Do not take any NSAIDs ie: Ibuprofen, Advil, Naproxen (Aleve), Celebrex, Motrin, BC and Goody Powder. Pt made aware to check BG every 2 hours prior to arrival to hospital on DOS. Pt made aware to treat a BG < 70 with 4 ounces of apple juice, wait 15 minutes after intervention to recheck BG, if BG remains < 70, call the Endoscopy  unit to speak with a nurse. Pt verbalized understanding of all pre-op instructions.

## 2018-01-23 ENCOUNTER — Other Ambulatory Visit: Payer: Self-pay

## 2018-01-23 ENCOUNTER — Encounter (HOSPITAL_COMMUNITY): Payer: Self-pay

## 2018-01-23 ENCOUNTER — Ambulatory Visit (HOSPITAL_COMMUNITY): Payer: PPO | Admitting: Certified Registered"

## 2018-01-23 ENCOUNTER — Encounter (HOSPITAL_COMMUNITY)
Admission: RE | Disposition: A | Discharge status: Home / Self Care | Payer: Self-pay | Source: Ambulatory Visit | Attending: Gastroenterology

## 2018-01-23 ENCOUNTER — Encounter: Payer: Self-pay | Admitting: Internal Medicine

## 2018-01-23 ENCOUNTER — Ambulatory Visit (HOSPITAL_COMMUNITY)
Admission: RE | Admit: 2018-01-23 | Discharge: 2018-01-23 | Disposition: A | Discharge status: Home / Self Care | Payer: PPO | Source: Ambulatory Visit | Attending: Gastroenterology | Admitting: Gastroenterology

## 2018-01-23 DIAGNOSIS — Z8249 Family history of ischemic heart disease and other diseases of the circulatory system: Secondary | ICD-10-CM | POA: Diagnosis not present

## 2018-01-23 DIAGNOSIS — Z87891 Personal history of nicotine dependence: Secondary | ICD-10-CM | POA: Insufficient documentation

## 2018-01-23 DIAGNOSIS — Z800 Family history of malignant neoplasm of digestive organs: Secondary | ICD-10-CM | POA: Diagnosis not present

## 2018-01-23 DIAGNOSIS — E785 Hyperlipidemia, unspecified: Secondary | ICD-10-CM | POA: Insufficient documentation

## 2018-01-23 DIAGNOSIS — E049 Nontoxic goiter, unspecified: Secondary | ICD-10-CM | POA: Diagnosis not present

## 2018-01-23 DIAGNOSIS — M545 Low back pain: Secondary | ICD-10-CM | POA: Diagnosis not present

## 2018-01-23 DIAGNOSIS — Z91041 Radiographic dye allergy status: Secondary | ICD-10-CM | POA: Insufficient documentation

## 2018-01-23 DIAGNOSIS — Z808 Family history of malignant neoplasm of other organs or systems: Secondary | ICD-10-CM | POA: Insufficient documentation

## 2018-01-23 DIAGNOSIS — I739 Peripheral vascular disease, unspecified: Secondary | ICD-10-CM | POA: Insufficient documentation

## 2018-01-23 DIAGNOSIS — M4800 Spinal stenosis, site unspecified: Secondary | ICD-10-CM | POA: Insufficient documentation

## 2018-01-23 DIAGNOSIS — I4891 Unspecified atrial fibrillation: Secondary | ICD-10-CM | POA: Diagnosis not present

## 2018-01-23 DIAGNOSIS — M1990 Unspecified osteoarthritis, unspecified site: Secondary | ICD-10-CM | POA: Diagnosis not present

## 2018-01-23 DIAGNOSIS — D500 Iron deficiency anemia secondary to blood loss (chronic): Secondary | ICD-10-CM | POA: Diagnosis not present

## 2018-01-23 DIAGNOSIS — Z9861 Coronary angioplasty status: Secondary | ICD-10-CM | POA: Insufficient documentation

## 2018-01-23 DIAGNOSIS — K3189 Other diseases of stomach and duodenum: Secondary | ICD-10-CM | POA: Diagnosis not present

## 2018-01-23 DIAGNOSIS — F410 Panic disorder [episodic paroxysmal anxiety] without agoraphobia: Secondary | ICD-10-CM | POA: Insufficient documentation

## 2018-01-23 DIAGNOSIS — H409 Unspecified glaucoma: Secondary | ICD-10-CM | POA: Diagnosis not present

## 2018-01-23 DIAGNOSIS — Z9049 Acquired absence of other specified parts of digestive tract: Secondary | ICD-10-CM | POA: Insufficient documentation

## 2018-01-23 DIAGNOSIS — K219 Gastro-esophageal reflux disease without esophagitis: Secondary | ICD-10-CM | POA: Insufficient documentation

## 2018-01-23 DIAGNOSIS — J449 Chronic obstructive pulmonary disease, unspecified: Secondary | ICD-10-CM | POA: Diagnosis not present

## 2018-01-23 DIAGNOSIS — Z888 Allergy status to other drugs, medicaments and biological substances status: Secondary | ICD-10-CM | POA: Insufficient documentation

## 2018-01-23 DIAGNOSIS — I10 Essential (primary) hypertension: Secondary | ICD-10-CM | POA: Diagnosis not present

## 2018-01-23 DIAGNOSIS — G8929 Other chronic pain: Secondary | ICD-10-CM | POA: Insufficient documentation

## 2018-01-23 DIAGNOSIS — Z7901 Long term (current) use of anticoagulants: Secondary | ICD-10-CM | POA: Diagnosis not present

## 2018-01-23 DIAGNOSIS — I2510 Atherosclerotic heart disease of native coronary artery without angina pectoris: Secondary | ICD-10-CM | POA: Diagnosis not present

## 2018-01-23 DIAGNOSIS — R011 Cardiac murmur, unspecified: Secondary | ICD-10-CM | POA: Insufficient documentation

## 2018-01-23 DIAGNOSIS — E118 Type 2 diabetes mellitus with unspecified complications: Secondary | ICD-10-CM | POA: Diagnosis not present

## 2018-01-23 DIAGNOSIS — Z90710 Acquired absence of both cervix and uterus: Secondary | ICD-10-CM | POA: Insufficient documentation

## 2018-01-23 DIAGNOSIS — Z7982 Long term (current) use of aspirin: Secondary | ICD-10-CM | POA: Diagnosis not present

## 2018-01-23 DIAGNOSIS — K760 Fatty (change of) liver, not elsewhere classified: Secondary | ICD-10-CM | POA: Insufficient documentation

## 2018-01-23 DIAGNOSIS — Z8349 Family history of other endocrine, nutritional and metabolic diseases: Secondary | ICD-10-CM | POA: Insufficient documentation

## 2018-01-23 DIAGNOSIS — R195 Other fecal abnormalities: Secondary | ICD-10-CM | POA: Diagnosis not present

## 2018-01-23 DIAGNOSIS — K6389 Other specified diseases of intestine: Secondary | ICD-10-CM | POA: Diagnosis not present

## 2018-01-23 DIAGNOSIS — Z79899 Other long term (current) drug therapy: Secondary | ICD-10-CM | POA: Insufficient documentation

## 2018-01-23 DIAGNOSIS — Z809 Family history of malignant neoplasm, unspecified: Secondary | ICD-10-CM | POA: Insufficient documentation

## 2018-01-23 DIAGNOSIS — Z8371 Family history of colonic polyps: Secondary | ICD-10-CM | POA: Insufficient documentation

## 2018-01-23 HISTORY — PX: COLONOSCOPY WITH PROPOFOL: SHX5780

## 2018-01-23 HISTORY — DX: Panic disorder (episodic paroxysmal anxiety): F41.0

## 2018-01-23 HISTORY — PX: ESOPHAGOGASTRODUODENOSCOPY (EGD) WITH PROPOFOL: SHX5813

## 2018-01-23 HISTORY — DX: Unspecified cataract: H26.9

## 2018-01-23 HISTORY — DX: Fatty (change of) liver, not elsewhere classified: K76.0

## 2018-01-23 LAB — CBC
HCT: 29 % — ABNORMAL LOW (ref 36.0–46.0)
Hemoglobin: 8.6 g/dL — ABNORMAL LOW (ref 12.0–15.0)
MCH: 20.9 pg — ABNORMAL LOW (ref 26.0–34.0)
MCHC: 29.7 g/dL — ABNORMAL LOW (ref 30.0–36.0)
MCV: 70.4 fL — ABNORMAL LOW (ref 78.0–100.0)
Platelets: 326 10*3/uL (ref 150–400)
RBC: 4.12 MIL/uL (ref 3.87–5.11)
RDW: 20.6 % — ABNORMAL HIGH (ref 11.5–15.5)
WBC: 7 10*3/uL (ref 4.0–10.5)

## 2018-01-23 LAB — GLUCOSE, CAPILLARY: Glucose-Capillary: 126 mg/dL — ABNORMAL HIGH (ref 65–99)

## 2018-01-23 SURGERY — ESOPHAGOGASTRODUODENOSCOPY (EGD) WITH PROPOFOL
Anesthesia: Monitor Anesthesia Care

## 2018-01-23 MED ORDER — LIDOCAINE 2% (20 MG/ML) 5 ML SYRINGE
INTRAMUSCULAR | Status: DC | PRN
  Administered 2018-01-23: 80 mg via INTRAVENOUS

## 2018-01-23 MED ORDER — PHENYLEPHRINE 40 MCG/ML (10ML) SYRINGE FOR IV PUSH (FOR BLOOD PRESSURE SUPPORT)
PREFILLED_SYRINGE | INTRAVENOUS | Status: DC | PRN
  Administered 2018-01-23: 120 ug via INTRAVENOUS
  Administered 2018-01-23: 160 ug via INTRAVENOUS
  Administered 2018-01-23 (×3): 120 ug via INTRAVENOUS

## 2018-01-23 MED ORDER — PROPOFOL 500 MG/50ML IV EMUL
INTRAVENOUS | Status: DC | PRN
  Administered 2018-01-23 (×2): via INTRAVENOUS
  Administered 2018-01-23: 200 ug/kg/min via INTRAVENOUS

## 2018-01-23 MED ORDER — SODIUM CHLORIDE 0.9 % IV SOLN: INTRAVENOUS | Status: DC

## 2018-01-23 MED ORDER — PROPOFOL 10 MG/ML IV BOLUS
INTRAVENOUS | Status: DC | PRN
  Administered 2018-01-23 (×3): 30 mg via INTRAVENOUS
  Administered 2018-01-23: 20 mg via INTRAVENOUS
  Administered 2018-01-23: 30 mg via INTRAVENOUS
  Administered 2018-01-23: 50 mg via INTRAVENOUS
  Administered 2018-01-23: 20 mg via INTRAVENOUS

## 2018-01-23 MED ORDER — OMEPRAZOLE 20 MG PO CPDR: 40.0000 mg | DELAYED_RELEASE_CAPSULE | Freq: Every day | ORAL | 0 refills | Status: DC

## 2018-01-23 MED ORDER — LACTATED RINGERS IV SOLN
INTRAVENOUS | Status: DC
  Administered 2018-01-23: 08:00:00 via INTRAVENOUS

## 2018-01-23 SURGICAL SUPPLY — 25 items

## 2018-01-23 NOTE — Discharge Instructions (Signed)
YOU HAD AN ENDOSCOPIC PROCEDURE TODAY: Refer to the procedure report and other information in the discharge instructions given to you for any specific questions about what was found during the examination. If this information does not answer your questions, please call [facility] office at [phone] to clarify.   YOU SHOULD EXPECT: Some feelings of bloating in the abdomen. Passage of more gas than usual. Walking can help get rid of the air that was put into your GI tract during the procedure and reduce the bloating. If you had a lower endoscopy (such as a colonoscopy or flexible sigmoidoscopy) you may notice spotting of blood in your stool or on the toilet paper. Some abdominal soreness may be present for a day or two, also.  DIET: Your first meal following the procedure should be a light meal and then it is ok to progress to your normal diet. A half-sandwich or bowl of soup is an example of a good first meal. Heavy or fried foods are harder to digest and may make you feel nauseous or bloated. Drink plenty of fluids but you should avoid alcoholic beverages for 24 hours. If you had a esophageal dilation, please see attached instructions for diet.   ACTIVITY: Your care partner should take you home directly after the procedure. You should plan to take it easy, moving slowly for the rest of the day. You can resume normal activity the day after the procedure however YOU SHOULD NOT DRIVE, use power tools, machinery or perform tasks that involve climbing or major physical exertion for 24 hours (because of the sedation medicines used during the test).   SYMPTOMS TO REPORT IMMEDIATELY: A gastroenterologist can be reached at any hour. Please call [phone]  for any of the following symptoms:  Following lower endoscopy (colonoscopy, flexible sigmoidoscopy) Excessive amounts of blood in the stool  Significant tenderness, worsening of abdominal pains  Swelling of the abdomen that is new, acute  Fever of 100 or  higher  Following upper endoscopy (EGD, EUS, ERCP, esophageal dilation) Vomiting of blood or coffee ground material  New, significant abdominal pain  New, significant chest pain or pain under the shoulder blades  Painful or persistently difficult swallowing  New shortness of breath  Black, tarry-looking or red, bloody stools  FOLLOW UP:  If any biopsies were taken you will be contacted by phone or by letter within the next 1-3 weeks. Call [phone]  if you have not heard about the biopsies in 3 weeks.  Please also call with any specific questions about appointments or follow up tests.  1. INCREASE YOUR PRILOSEC TO 40 MG DAILY.  YOU CAN TAKE 2 OF THE 20 MG CAPSULES TOGETHER EACH MORNING, OR 1 CAPSULE TWICE A DAY.    2. A 9-MONTH PRESCRIPTION FOR PRILOSEC WAS SENT TO [facility]; YOU MAY WANT TO CALL AND HAVE THEM CANCEL THIS PRESCRIPTION SINCE IT WAS WRITTEN GENERICALLY, DOES NOT HAVE REFILLS, AND SINCE YOU BUY YOUR PRILOSEC OVER-THE-COUNTER.  3. KEEP FOLLOW-UP OFFICE APPOINTMENT WITH DR. Cristina Gong  4. HAVE YOUR PRESCRIBING PHYSICIAN REVIEW YOUR CELEBREX FOR THE CORRECT DOSAGE, AND TO SEE IF IT IS NECESSARY AT ALL.  5. YOU MAY RESUME YOUR XARELTO AND ASPIRIN AT THIS TIME, AS WELL AS ALL YOUR OTHER MEDICATIONS.

## 2018-01-23 NOTE — Op Note (Signed)
[facility] Patient Name: [name] Procedure Date : 01/23/2018 MRN: [id number] Attending MD: Ronald Lobo , MD Date of Birth: July 04, 1951 CSN: [id number] Age: 67 Admit Type: Outpatient Procedure:                Upper GI endoscopy Indications:              Iron deficiency anemia secondary to chronic blood                            loss, Heme positive stool Providers:                Ronald Lobo, MD, Presley Raddle, RN, Cherylynn Ridges, Technician Referring MD:              Medicines:                Monitored Anesthesia Care Complications:            No immediate complications. Estimated Blood Loss:     Estimated blood loss: none. Procedure:                Pre-Anesthesia Assessment:                           - Prior to the procedure, a History and Physical                            was performed, and patient medications and                            allergies were reviewed. The patient's tolerance of                            previous anesthesia was also reviewed. The risks                            and benefits of the procedure and the sedation                            options and risks were discussed with the patient.                            All questions were answered, and informed consent                            was obtained. Prior Anticoagulants: The patient has                            taken aspirin, last dose was 5 days prior to                            procedure, and Xarelto, last dose 2 days prior to  procedure. ASA Grade Assessment: III - A patient                            with severe systemic disease. After reviewing the                            risks and benefits, the patient was deemed in                            satisfactory condition to undergo the procedure.                           After obtaining informed consent, the endoscope was                            passed under  direct vision. Throughout the                            procedure, the patient's blood pressure, pulse, and                            oxygen saturations were monitored continuously. The                            [id number] ([id number]) scope was introduced through the                            mouth, and advanced to the second part of duodenum.                            The upper GI endoscopy was accomplished without                            difficulty. The patient tolerated the procedure                            well. Scope In: Scope Out: Findings:      The examined esophagus was normal.      Localized moderately eroded, friable mucosa with contact bleeding was       found at the pylorus.      The exam of the stomach was otherwise normal.      The cardia and gastric fundus were normal on retroflexion.      The examined duodenum was normal. Impression:               - Normal esophagus.                           - Friable gastric mucosa, probably due to Celebrex                            and/or ASA, possible cause for patient's transient                            heme positivity  and recurrent anemia.                           - Normal examined duodenum.                           - No specimens collected. Recommendation:           - Increase Prilosec (omeprazole) to 40 mg PO daily                            indefinitely. Procedure Code(s):        --- Professional ---                           [id number], Esophagogastroduodenoscopy, flexible,                            transoral; diagnostic, including collection of                            specimen(s) by brushing or washing, when performed                            (separate procedure) Diagnosis Code(s):        --- Professional ---                           K31.89, Other diseases of stomach and duodenum                           D50.0, Iron deficiency anemia secondary to blood                            loss (chronic)                            R19.5, Other fecal abnormalities CPT copyright 2017 American [employer] Association. All rights reserved. The codes documented in this report are preliminary and upon coder review may  be revised to meet current compliance requirements. Ronald Lobo, MD 01/23/2018 9:20:59 AM This report has been signed electronically. Number of Addenda: 0

## 2018-01-23 NOTE — Anesthesia Preprocedure Evaluation (Signed)
Anesthesia Evaluation  Patient identified by MRN, date of birth, ID band Patient awake    Reviewed: Allergy & Precautions, NPO status , Patient's Chart, lab work & pertinent test results  Airway Mallampati: II  TM Distance: >3 FB     Dental   Pulmonary shortness of breath, asthma , pneumonia, COPD, former smoker,    breath sounds clear to auscultation       Cardiovascular hypertension, + CAD and + Peripheral Vascular Disease  + Valvular Problems/Murmurs  Rhythm:Irregular Rate:Normal     Neuro/Psych    GI/Hepatic Neg liver ROS, GERD  ,  Endo/Other  diabetes  Renal/GU negative Renal ROS     Musculoskeletal   Abdominal   Peds  Hematology   Anesthesia Other Findings   Reproductive/Obstetrics                             Anesthesia Physical Anesthesia Plan  ASA: III  Anesthesia Plan: MAC   Post-op Pain Management:    Induction: Intravenous  PONV Risk Score and Plan: Treatment may vary due to age or medical condition  Airway Management Planned: Simple Face Mask and Nasal Cannula  Additional Equipment:   Intra-op Plan:   Post-operative Plan:   Informed Consent: I have reviewed the patients History and Physical, chart, labs and discussed the procedure including the risks, benefits and alternatives for the proposed anesthesia with the patient or authorized representative who has indicated his/her understanding and acceptance.   Dental advisory given  Plan Discussed with: Anesthesiologist and CRNA  Anesthesia Plan Comments:         Anesthesia Quick Evaluation

## 2018-01-23 NOTE — Anesthesia Postprocedure Evaluation (Signed)
Anesthesia Post Note  Patient: [name]  Procedure(s) Performed: ESOPHAGOGASTRODUODENOSCOPY (EGD) WITH PROPOFOL (N/A ) COLONOSCOPY WITH PROPOFOL (N/A )     Patient location during evaluation: Endoscopy Anesthesia Type: MAC Level of consciousness: awake Vital Signs Assessment: post-procedure vital signs reviewed and stable Cardiovascular status: stable Anesthetic complications: no    Last Vitals:  Vitals:   01/23/18 0910 01/23/18 0920  BP: (!) 106/36 (!) 90/36  Pulse: 84 71  Resp: 12 10  Temp:    SpO2: 92% 96%    Last Pain:  Vitals:   01/23/18 0920  TempSrc:   PainSc: 0-No pain                 Lalah Durango

## 2018-01-23 NOTE — Transfer of Care (Signed)
Immediate Anesthesia Transfer of Care Note  Patient: [name]  Procedure(s) Performed: ESOPHAGOGASTRODUODENOSCOPY (EGD) WITH PROPOFOL (N/A ) COLONOSCOPY WITH PROPOFOL (N/A )  Patient Location: Endoscopy Unit  Anesthesia Type:MAC  Level of Consciousness: drowsy  Airway & Oxygen Therapy: Patient Spontanous Breathing and Patient connected to nasal cannula oxygen  Post-op Assessment: Report given to RN and Post -op Vital signs reviewed and stable  Post vital signs: Reviewed and stable  Last Vitals:  Vitals Value Taken Time  BP 106/36 01/23/2018  9:10 AM  Temp    Pulse 84 01/23/2018  9:10 AM  Resp 12 01/23/2018  9:10 AM  SpO2 92 % 01/23/2018  9:10 AM    Last Pain:  Vitals:   01/23/18 0910  TempSrc:   PainSc: 0-No pain         Complications: No apparent anesthesia complications

## 2018-01-23 NOTE — Anesthesia Procedure Notes (Signed)
Procedure Name: MAC Date/Time: 01/23/2018 8:25 AM Performed by: Imagene Riches, CRNA Pre-anesthesia Checklist: Patient identified, Emergency Drugs available, Suction available and Patient being monitored Patient Re-evaluated:Patient Re-evaluated prior to induction Oxygen Delivery Method: Nasal cannula Preoxygenation: Pre-oxygenation with 100% oxygen

## 2018-01-23 NOTE — Telephone Encounter (Signed)
Please advise. Thanks.  

## 2018-01-23 NOTE — Progress Notes (Addendum)
Patient stated she was instructed to check her blood sugar the morning of her endoscopy procedure, she did so and relayed that her blood sugar was 120. She would prefer for [name] to not recheck her blood sugar in endo. I notified MD Nyoka Cowden and she said she would prefer for [name] to check it, the patient said it was fine if we needed it.

## 2018-01-23 NOTE — H&P (Signed)
[name] is an 67 y.o. female.   Chief Complaint: Anemia HPI: Pleasant 67 year old female, maintained on aspirin and Xarelto because of atrial fibrillation, has a history of recurrent cryptogenic blood loss, with anemia.  Endoscopy and colonoscopy 3 years ago were negative except for a proximal colonic vascular malformation.  Recently, her hemoglobin had fallen to 7.2 without overt GI bleeding, but with typical anemia symptoms.  She went to the emergency room where she received 2 units of packed cells and her current hemoglobin is 8.6.  Her baseline, about 6 months ago, had been 11.4 with a normal MCV where is currently she was iron deficient.  She was Hemoccult positive in the emergency room but Hemoccult negative when I subsequently checked her in the office.  Past Medical History:  Diagnosis Date  . Anemia, unspecified   . Arthritis   . Asthma   . Atrial fibrillation with RVR ([place])    a. on Xarelto  . Bell's palsy    Facial nerve decompression in 2001  . Chronic low back pain   . COPD with asthma ([place])   . Coronary artery disease    Myoview 04/12/11 was entirely normal. ECHO 02/26/08 showed only minor abnormalities. Stenting 05/26/08 of her posterolateral branch to the left circumflex coronary artery. Used a 2.5x71mm Taxus Monorail stent.myoview 2014 was without ischemia  . Diabetes mellitus    Type 2  . Early cataracts, bilateral   . Fatty liver   . GERD (gastroesophageal reflux disease)   . Glaucoma   . Glaucoma   . Goiter   . Heart murmur   . History of nuclear stress test 2012; 2014   lexiscan; normal pattern of perfusion; normal, low risk scan   . Hyperlipidemia   . Hypertension   . Panic disorder   . Pneumonia 2008  . Polycystic ovary disease    Hysterectomy in 1982 for this  . Shortness of breath dyspnea    ECHO 02/26/08 showed only minor abnormalities  . Spinal stenosis     Past Surgical History:  Procedure Laterality Date  . ABDOMINAL HYSTERECTOMY  1982   &  BSO; for polycystic ovary disease  . CARDIOVERSION N/A 12/17/2013   Procedure: CARDIOVERSION;  Surgeon: Pixie Casino, MD;  Location: [facility] ENDOSCOPY;  Service: Cardiovascular;  Laterality: N/A;  . CARDIOVERSION N/A 09/30/2015   Procedure: CARDIOVERSION;  Surgeon: Dorothy Spark, MD;  Location: [facility] ENDOSCOPY;  Service: Cardiovascular;  Laterality: N/A;  . CARDIOVERSION N/A 06/22/2016   Procedure: CARDIOVERSION;  Surgeon: Skeet Latch, MD;  Location: [place];  Service: Cardiovascular;  Laterality: N/A;  . COLONOSCOPY     last 2009; Dr Cristina Gong; due 2019  . CORONARY ANGIOPLASTY  05/26/2008   Stenting of her posterolateral branch to the left circumflex coronary artery. Used a 2.5x15mm Taxus Monorail stent.  [name] FACIAL NERVE DECOMPRESSION  2001/2002   bells palsy   . LAPAROSCOPIC CHOLECYSTECTOMY  06/15/2011    Dr Dalbert Batman  . TEE WITHOUT CARDIOVERSION N/A 12/17/2013   Procedure: TRANSESOPHAGEAL ECHOCARDIOGRAM (TEE);  Surgeon: Pixie Casino, MD;  [facility];  Service: Cardiovascular;  Laterality: N/A;  trish/ja  . TRANSTHORACIC ECHOCARDIOGRAM  1/61/0960   LV systolic function normal with mild conc LVH; LA mildly dilated; trace MR/TR  . UPPER GI ENDOSCOPY  2009   negative    Family History  Problem Relation Age of Onset  . Heart attack Father 61       2nd MI at 39  . Colon cancer Brother  73  . Gout Brother   . Ulcers Mother   . Emphysema Mother 68  . Colon polyps Sister   . Cancer Sister        Basal cell carcinoma  . Pneumonia Maternal Grandmother   . Hypertension Brother   . Hyperlipidemia Brother   . Cancer Brother        Skin  . Diabetes Neg Hx   . Stroke Neg Hx    Social History:  reports that she quit smoking about 24 years ago. Her smoking use included cigarettes. She has a 45.00 pack-year smoking history. She has never used smokeless tobacco. She reports that she does not drink alcohol or use drugs.  Allergies:  Allergies  Allergen Reactions  . Benadryl  [Diphenhydramine Hcl] Other (See Comments)    Restless leg  . Clopidogrel Bisulfate Nausea Only    Nausea & pain  . Contrast Media [Iodinated Diagnostic Agents] Palpitations    Rapid heart rate, hot    Medications Prior to Admission  Medication Sig Dispense Refill  . ascorbic acid (VITAMIN C) 1000 MG tablet Take 1,000 mg by mouth 2 (two) times daily.     [name] aspirin 325 MG tablet Take 325 mg by mouth at bedtime.    . budesonide-formoterol (SYMBICORT) 160-4.5 MCG/ACT inhaler USE 2 PUFFS TWICE DAILY (Patient taking differently: Inhale 2 puffs into the lungs 2 (two) times daily as needed (asthma). USE 2 PUFFS TWICE DAILY AS NEEDED) 10.2 g 4  . CALCIUM-MAG-VIT C-VIT D PO Take 1 tablet by mouth daily.     . celecoxib (CELEBREX) 200 MG capsule Take 200 mg by mouth 2 (two) times daily.     . CRESTOR 10 MG tablet TAKE ONE TABLET AT BEDTIME. 30 tablet 6  . diltiazem (CARDIZEM CD) 180 MG 24 hr capsule TAKE 1 CAPSULE DAILY. 90 capsule 3  . estradiol (ESTRACE) 0.5 MG tablet Take 0.5 mg by mouth daily.     [name] gabapentin (NEURONTIN) 100 MG capsule Take 1 capsule (100 mg total) by mouth 3 (three) times daily. 180 capsule 6  . guaiFENesin (MUCINEX) 600 MG 12 hr tablet Take 600 mg by mouth 2 (two) times daily.    [name] HYDROcodone-acetaminophen (NORCO/VICODIN) 5-325 MG tablet Take 1 tablet by mouth daily as needed for moderate pain.   0  . INVOKANA 100 MG TABS tablet TAKE 1 TABLET ONCE DAILY. 30 tablet 2  . isosorbide mononitrate (IMDUR) 30 MG 24 hr tablet Take 15 mg by mouth daily.    [name] latanoprost (XALATAN) 0.005 % ophthalmic solution Place 1 drop into both eyes at bedtime.    . levalbuterol (XOPENEX) 0.63 MG/3ML nebulizer solution Take 3 mLs (0.63 mg total) by nebulization every 6 (six) hours as needed for wheezing or shortness of breath. 75 mL 1  . LORazepam (ATIVAN) 0.5 MG tablet Take 1-2 tablets (0.5-1 mg total) by mouth at bedtime. 60 tablet 5  . Melatonin 5 MG TABS Take 1 tablet by mouth at bedtime.     .  metoprolol succinate (TOPROL-XL) 25 MG 24 hr tablet Take 25 mg by mouth daily as needed (AFIB).    [name] metoprolol succinate (TOPROL-XL) 50 MG 24 hr tablet Take 150 mg by mouth every morning. Take with or immediately following a meal.    . Multiple Vitamins-Iron (MULTIVITAMINS WITH IRON) TABS Take 1 tablet by mouth 2 (two) times daily.     [name] nystatin ointment (MYCOSTATIN) Apply 1 application topically 2 (two) times daily as needed (IRRITATION).  0  . omega-3 acid ethyl esters (LOVAZA) 1 G capsule Take 1 g by mouth daily.     [name] omeprazole (PRILOSEC) 20 MG capsule Take 20 mg by mouth daily.     [name] PARoxetine (PAXIL) 10 MG tablet Take 2.5 tablets (25 mg total) by mouth daily. 225 tablet 11  . quinapril (ACCUPRIL) 40 MG tablet TAKE 1 TABLET ONCE DAILY. 90 tablet 1  . rOPINIRole (REQUIP XL) 2 MG 24 hr tablet Take 1 tablet (2 mg total) by mouth at bedtime. 30 tablet 5  . senna (SENOKOT) 8.6 MG TABS Take 1 tablet by mouth daily.     . sitaGLIPtin-metformin (JANUMET) 50-1000 MG tablet TAKE 1/2 TABLET IN THE MORNING WITH BREAKFAST AND 1 TABLET IN THE EVENING WITH SUPPER. (Patient taking differently: Take 0.5-1 tablets by mouth See admin instructions. TAKE 1/2 TABLET IN THE MORNING WITH BREAKFAST AND 1 TABLET IN THE EVENING WITH SUPPER.) 140 tablet 6  . XARELTO 20 MG TABS tablet TAKE 1 TABLET ONCE DAILY WITH SUPPER. 30 tablet 5  . XOPENEX HFA 45 MCG/ACT inhaler USE 1 TO 2 PUFFS EVERY 6 HOURS AS NEEDED FOR WHEEZING. 15 g 0  . Continuous Blood Gluc Sensor (FREESTYLE LIBRE SENSOR SYSTEM) MISC Use to monitor sugars 1 each 0  . ONETOUCH DELICA LANCETS FINE MISC 1 Units by Does not apply route as directed. 100 each 3  . rOPINIRole (REQUIP) 0.5 MG tablet Take 1 tab at midnight. 60 tablet 6    Results for orders placed or performed during the hospital encounter of 01/23/18 (from the past 48 hour(s))  CBC     Status: Abnormal   Collection Time: 01/23/18  7:18 AM  Result Value Ref Range   WBC 7.0 4.0 - 10.5 K/uL    RBC 4.12 3.87 - 5.11 MIL/uL   Hemoglobin 8.6 (L) 12.0 - 15.0 g/dL   HCT 29.0 (L) 36.0 - 46.0 %   MCV 70.4 (L) 78.0 - 100.0 fL   MCH 20.9 (L) 26.0 - 34.0 pg   MCHC 29.7 (L) 30.0 - 36.0 g/dL   RDW 20.6 (H) 11.5 - 15.5 %   Platelets 326 150 - 400 K/uL    Comment: Performed at [facility], [place] [address] [place], [place] [address]  Glucose, capillary     Status: Abnormal   Collection Time: 01/23/18  8:01 AM  Result Value Ref Range   Glucose-Capillary 126 (H) 65 - 99 mg/dL   No results found.  ROS the patient does not feel quite as strong as she did when she first had her transfusion in the emergency room  Blood pressure (!) 138/37, pulse 90, temperature 98.7 F (37.1 C), temperature source Oral, resp. rate 16, height 5\' 2"  (1.575 m), weight 87.1 kg (192 lb), SpO2 95 %. Physical Exam pleasant, reasonably healthy appearing female in no distress.  Anicteric, no frank pallor, chest clear, heart without murmur or current arrhythmia despite history of atrial fibrillation, abdomen without mass or tenderness, no overt focal neurologic deficits, cognitively intact  Assessment/Plan Recurrent iron deficiency anemia due to chronic blood loss.  Proceed to endoscopic and colonoscopic evaluation, with intended argon plasma coagulation ablation of her proximal colonic AVM if it can be located.  Thereafter, if the patient shows evidence of recurrent anemia, probable video capsule endoscopy of the small bowel.  Cleotis Nipper, MD 01/23/2018, 8:22 AM

## 2018-01-23 NOTE — Op Note (Signed)
[facility] Patient Name: [name] Procedure Date : 01/23/2018 MRN: [id number] Attending MD: Ronald Lobo , MD Date of Birth: 1951-06-06 CSN: [id number] Age: 67 Admit Type: Outpatient Procedure:                Colonoscopy Indications:              Last colonoscopy: January 2016, Heme positive                            stool, Iron deficiency anemia secondary to chronic                            blood loss Providers:                Ronald Lobo, MD, Presley Raddle, RN, Cherylynn Ridges, Technician Referring MD:              Medicines:                Monitored Anesthesia Care Complications:            No immediate complications. Estimated Blood Loss:     Estimated blood loss: none. Procedure:                Pre-Anesthesia Assessment:                           - Prior to the procedure, a History and Physical                            was performed, and patient medications and                            allergies were reviewed. The patient's tolerance of                            previous anesthesia was also reviewed. The risks                            and benefits of the procedure and the sedation                            options and risks were discussed with the patient.                            All questions were answered, and informed consent                            was obtained. Prior Anticoagulants: The patient has                            taken aspirin, last dose was 5 days prior to                            procedure, and  Xarelto, last dose 2 days prior to                            procedure. ASA Grade Assessment: III - A patient                            with severe systemic disease. After reviewing the                            risks and benefits, the patient was deemed in                            satisfactory condition to undergo the procedure.                           After obtaining informed consent, the  colonoscope                            was passed under direct vision. Throughout the                            procedure, the patient's blood pressure, pulse, and                            oxygen saturations were monitored continuously. The                            [id number] ([id number]) scope was introduced through                            the anus and advanced to the the terminal ileum.                            The colonoscopy was performed without difficulty.                            The patient tolerated the procedure well. The                            quality of the bowel preparation was excellent. Scope In: 8:41:54 AM Scope Out: 9:03:39 AM Scope Withdrawal Time: 0 hours 16 minutes 59 seconds  Total Procedure Duration: 0 hours 21 minutes 45 seconds  Findings:      The perianal and digital rectal examinations were normal.      A patchy area of mildly erythematous mucosa was found in the proximal       ascending colon. However, it did NOT appear that this was a       vascular/angiodysplastic lesion.      No other significant abnormalities were identified in a careful       examination of the remainder of the colon.      There is no endoscopic evidence of diverticula, mass, polyps or       angiodysplasia in the entire colon.      The terminal ileum appeared normal.  The retroflexed view of the distal rectum and anal verge was normal and       showed no anal or rectal abnormalities. Impression:               - No source of anemia or heme positive stool                            endoscopically evident.                           - Erythematous mucosa in the proximal ascending                            colon. Previously-noted AVM in proximal colon not                            visualized on today's exam.                           - The examined portion of the ileum was normal.                           - The distal rectum and anal verge are normal on                             retroflexion view.                           - No specimens collected. Recommendation:           - Repeat colonoscopy is not recommended for                            screening purposes in view of patient's age,                            co-morbidities, and the favorable findings on the                            current exam.                           - Follow-up in office as planned, with                            consideration for capsule endoscopy, especiallly if                            she shows evidence of recurrent heme positivity                            and/or anemia, despite more aggressive PPI therapy                            in view of the pyloric erosions noted on today's  egd while on Prilosec 20 mg daily. Procedure Code(s):        --- Professional ---                           [phone], Colonoscopy, flexible; diagnostic, including                            collection of specimen(s) by brushing or washing,                            when performed (separate procedure) Diagnosis Code(s):        --- Professional ---                           R19.5, Other fecal abnormalities                           D50.0, Iron deficiency anemia secondary to blood                            loss (chronic)                           K63.89, Other specified diseases of intestine CPT copyright 2017 American [employer] Association. All rights reserved. The codes documented in this report are preliminary and upon coder review may  be revised to meet current compliance requirements. Ronald Lobo, MD 01/23/2018 9:31:17 AM This report has been signed electronically. Number of Addenda: 0

## 2018-01-25 ENCOUNTER — Encounter (HOSPITAL_COMMUNITY): Payer: Self-pay | Admitting: Gastroenterology

## 2018-01-28 ENCOUNTER — Encounter: Payer: PPO | Admitting: Orthotics

## 2018-01-28 DIAGNOSIS — Z6835 Body mass index (BMI) 35.0-35.9, adult: Secondary | ICD-10-CM | POA: Diagnosis not present

## 2018-01-28 DIAGNOSIS — Z01419 Encounter for gynecological examination (general) (routine) without abnormal findings: Secondary | ICD-10-CM | POA: Diagnosis not present

## 2018-01-28 DIAGNOSIS — N951 Menopausal and female climacteric states: Secondary | ICD-10-CM | POA: Diagnosis not present

## 2018-01-31 ENCOUNTER — Other Ambulatory Visit (INDEPENDENT_AMBULATORY_CARE_PROVIDER_SITE_OTHER): Payer: PPO

## 2018-01-31 DIAGNOSIS — D500 Iron deficiency anemia secondary to blood loss (chronic): Secondary | ICD-10-CM

## 2018-01-31 LAB — CBC
HCT: 28.1 % — ABNORMAL LOW (ref 36.0–46.0)
Hemoglobin: 8.6 g/dL — ABNORMAL LOW (ref 12.0–15.0)
MCHC: 30.6 g/dL (ref 30.0–36.0)
MCV: 69.6 fl — ABNORMAL LOW (ref 78.0–100.0)
Platelets: 272 10*3/uL (ref 150.0–400.0)
RBC: 4.03 Mil/uL (ref 3.87–5.11)
RDW: 21.7 % — ABNORMAL HIGH (ref 11.5–15.5)
WBC: 9.3 10*3/uL (ref 4.0–10.5)

## 2018-02-04 ENCOUNTER — Encounter: Payer: PPO | Admitting: Orthotics

## 2018-02-04 ENCOUNTER — Telehealth: Payer: Self-pay | Admitting: Cardiovascular Disease

## 2018-02-04 ENCOUNTER — Encounter: Payer: Self-pay | Admitting: Internal Medicine

## 2018-02-04 ENCOUNTER — Ambulatory Visit (INDEPENDENT_AMBULATORY_CARE_PROVIDER_SITE_OTHER): Payer: PPO | Admitting: Internal Medicine

## 2018-02-04 VITALS — BP 128/72 | HR 70 | Temp 97.7°F | Resp 16 | Ht 62.0 in | Wt 187.8 lb

## 2018-02-04 DIAGNOSIS — M7989 Other specified soft tissue disorders: Secondary | ICD-10-CM | POA: Insufficient documentation

## 2018-02-04 DIAGNOSIS — D500 Iron deficiency anemia secondary to blood loss (chronic): Secondary | ICD-10-CM | POA: Diagnosis not present

## 2018-02-04 DIAGNOSIS — R5383 Other fatigue: Secondary | ICD-10-CM | POA: Diagnosis not present

## 2018-02-04 NOTE — Assessment & Plan Note (Signed)
Likely related to her anemia and needs iron transfusion to help with her symptoms and her overall level of functioning.

## 2018-02-04 NOTE — Telephone Encounter (Addendum)
[name],  Please advise regarding swelling and pain in left calf.

## 2018-02-04 NOTE — Assessment & Plan Note (Signed)
With ongoing blood loss. Will arrange iron transfusion as she is iron deficient and poorly tolerating the oral iron. Blood counts are stable at 8.6 but not improving and given her cardiac history she is still having some symptoms (not angina) at this level and likely would feel better with higher blood counts.

## 2018-02-04 NOTE — Telephone Encounter (Signed)
Attempted to reach patient via phone to assess. A female on th line who did not identify herself states patient went to bed at 0630 and will not call us back until after 1430 hrs.I gave her our number and asked that she call back,I spoke with Jonelle Sidle at [facility] and they could see patient today if needed.   Await call back from patient.

## 2018-02-04 NOTE — Telephone Encounter (Signed)
New message   Jonelle Sidle from [facility] is asking for a [phone] before calling pt.  Pt PCP is calling for pt because she sent a MyChart message to Dr. Sharlet Salina. She said pt needs to be seen for leg swelling and pain in lower left calf, and SOB and dizziness. Jonelle Sidle from PCP said she needs to know if pt can be seen today because she feels it's more cardiac related.

## 2018-02-04 NOTE — Patient Instructions (Addendum)
We will arrange the iron transfusion.

## 2018-02-04 NOTE — Assessment & Plan Note (Signed)
No evidence clinically of blood clot. She was off xarelto for EGD/colonoscopy about 10 days ago. No significant swelling or pain on exam. She continues on xarelto currently so no further evaluation indicated. Could be related to some third spacing of fluid with her ongoing blood loss and anemia.

## 2018-02-04 NOTE — Progress Notes (Signed)
   Subjective:    Patient ID: [name], female    DOB: Sep 21, 1950, 67 y.o.   MRN: [id number]  HPI The patient is a 67 YO female coming in for leg swelling (left calf with some mild swelling, indent with socks, and some pain to the touch, she is still getting pain with walking and tiredness, denies rash or redness, better today with resting them and propping them up) and fatigue (felt much improved after blood transfusion and then within a week is feeling back to low energy and low ability to walk, she is improved from worst, taking iron pills daily) and blood loss anemia (still seeing blood in stool, about 50% of the time, blood counts done within a few days with stable but not improving blood counts, she does not tolerate oral iron well but it trying as she knows it is important, EGD and colonoscopy without a good source for bleeding, just some friable gastric tissue). Denies missing xarelto.   Review of Systems  Constitutional: Positive for activity change, appetite change and fatigue. Negative for fever and unexpected weight change.  HENT: Negative.   Eyes: Negative.   Respiratory: Positive for shortness of breath. Negative for cough and chest tightness.   Cardiovascular: Positive for leg swelling. Negative for chest pain and palpitations.  Gastrointestinal: Positive for blood in stool. Negative for abdominal distention, abdominal pain, anal bleeding, constipation, diarrhea, nausea and vomiting.  Musculoskeletal: Positive for gait problem and myalgias. Negative for arthralgias, back pain, neck pain and neck stiffness.  Skin: Negative.   Neurological: Positive for weakness. Negative for dizziness, seizures, facial asymmetry, numbness and headaches.  Psychiatric/Behavioral: Negative.       Objective:   Physical Exam  Constitutional: She is oriented to person, place, and time. She appears well-developed and well-nourished.  HENT:  Head: Normocephalic and atraumatic.  Eyes: EOM are  normal.  Neck: Normal range of motion.  Cardiovascular: Normal rate and regular rhythm.  Pulmonary/Chest: Effort normal and breath sounds normal. No respiratory distress. She has no wheezes. She has no rales.  Abdominal: Soft. She exhibits no distension. There is no tenderness. There is no rebound.  Musculoskeletal: She exhibits tenderness. She exhibits no edema.  Some mild soreness in left calf, no noticeable swelling, some indent from socks  Neurological: She is alert and oriented to person, place, and time. Coordination normal.  Skin: Skin is warm and dry.  Psychiatric: She has a normal mood and affect.   Vitals:   02/04/18 1539  BP: 128/72  Pulse: 70  Resp: 16  Temp: 97.7 F (36.5 C)  TempSrc: Oral  SpO2: 97%  Weight: 187 lb 12.8 oz (85.2 kg)  Height: 5\' 2"  (1.575 m)      Assessment & Plan:

## 2018-02-06 ENCOUNTER — Telehealth: Payer: Self-pay

## 2018-02-06 NOTE — Telephone Encounter (Signed)
Per dr crawford---pt needs to come in for iron infusion---this will be done at patient care center at [facility] long facility---I have scheduled tentative 9am on Tuesday, July 2nd, patient will need to be prepared to stay for about 4 to 5 hours---I left message advising patient of appt scheduled, please call back if this appt will not be ok with her, can talk with Makell Cyr,RN at [place] office --dr crawfords orders and med list have been faxed to [phone]--faxed forms are at Melitza Metheny's desk

## 2018-02-07 ENCOUNTER — Encounter: Payer: Self-pay | Admitting: Cardiovascular Disease

## 2018-02-07 ENCOUNTER — Ambulatory Visit: Payer: PPO | Admitting: Cardiovascular Disease

## 2018-02-07 DIAGNOSIS — M7989 Other specified soft tissue disorders: Secondary | ICD-10-CM

## 2018-02-07 NOTE — Progress Notes (Signed)
[name] comes in today because of swelling of her left lower extremity.  She said this occurred yesterday but today is only minimally swollen.  She is on Xarelto which makes the possibility of a DVT unlikely.  She does not have evidence of heart failure and she has no swelling on the right side.  At this point I am not concerned about her mild swelling on the left and reassured her.  Lorretta Harp, M.D., [place], [facility], [place] [address] [place], [place]  [address]  [phone] 02/07/2018 3:34 PM

## 2018-02-07 NOTE — Patient Instructions (Signed)
Your physician wants you to follow-up in: 6 MONTHS WITH EXTENDER You will receive a reminder letter in the mail two months in advance. If you don't receive a letter, please call our office to schedule the follow-up appointment.   Your physician wants you to follow-up in: [name] will receive a reminder letter in the mail two months in advance. If you don't receive a letter, please call our office to schedule the follow-up appointment.   If you need a refill on your cardiac medications before your next appointment, please call your pharmacy.

## 2018-02-07 NOTE — Assessment & Plan Note (Signed)
[name] comes in today because of swelling of her left lower extremity.  She said this occurred yesterday but today is only minimally swollen.  She is on Xarelto which makes the possibility of a DVT unlikely.  She does not have evidence of heart failure and she has no swelling on the right side.  At this point I am not concerned about her mild swelling on the left and reassured her.

## 2018-02-11 ENCOUNTER — Ambulatory Visit (HOSPITAL_COMMUNITY)
Admission: RE | Admit: 2018-02-11 | Discharge: 2018-02-11 | Disposition: A | Discharge status: Home / Self Care | Payer: PPO | Source: Ambulatory Visit | Attending: Internal Medicine | Admitting: Internal Medicine

## 2018-02-11 DIAGNOSIS — D649 Anemia, unspecified: Secondary | ICD-10-CM | POA: Insufficient documentation

## 2018-02-11 MED ORDER — SODIUM CHLORIDE 0.9 % IV SOLN
25.0000 mg | Freq: Once | INTRAVENOUS | Status: AC
  Administered 2018-02-11: 25 mg via INTRAVENOUS
  Filled 2018-02-11: qty 0.5

## 2018-02-11 MED ORDER — IRON DEXTRAN 50 MG/ML IJ SOLN
975.0000 mg | Freq: Once | INTRAMUSCULAR | Status: AC
  Administered 2018-02-11: 975 mg via INTRAVENOUS
  Filled 2018-02-11: qty 19.5

## 2018-02-11 MED ORDER — SODIUM CHLORIDE 0.9 % IV SOLN
INTRAVENOUS | Status: DC
  Administered 2018-02-11: 10:00:00 via INTRAVENOUS

## 2018-02-11 NOTE — Telephone Encounter (Signed)
I have talked with osagi and also director of care center, steve---per dr Sharlet Salina, ok to change her order to say "infusion can be given over 4 to 6 hours" per care center and pharmacist protocol.

## 2018-02-11 NOTE — Telephone Encounter (Signed)
[name] 02/11/2018 08:38 AM  Summary: Clarification on order    [place] from [facility] called to get clarification on an iron infusion order.  Please call to advise.  CB# [phone]

## 2018-02-11 NOTE — Discharge Instructions (Signed)

## 2018-02-11 NOTE — Progress Notes (Signed)
Test dose for Iron dextran complex completed. Post infusion, vitals are stable, no pain, no itching, patient alert and oriented. Will proceed to infuse the rest of the iron dextran.

## 2018-02-11 NOTE — Progress Notes (Signed)
Patient received Iron Dextran via PIV as ordered. Patient observed for at least 30 minutes post infusion.  Patient tolerated well, vitals stable, discharge instructions given, verbalized understanding. Patient alert, oriented and ambulatory at the time of discharge.

## 2018-02-12 ENCOUNTER — Other Ambulatory Visit: Payer: Self-pay | Admitting: Internal Medicine

## 2018-02-12 NOTE — Telephone Encounter (Signed)
Control database checked last refill: 01/05/2018  LOV: 02/04/2018

## 2018-02-14 ENCOUNTER — Ambulatory Visit: Payer: Self-pay | Admitting: Internal Medicine

## 2018-02-14 ENCOUNTER — Ambulatory Visit (INDEPENDENT_AMBULATORY_CARE_PROVIDER_SITE_OTHER): Payer: PPO | Admitting: Internal Medicine

## 2018-02-14 ENCOUNTER — Encounter: Payer: Self-pay | Admitting: Internal Medicine

## 2018-02-14 DIAGNOSIS — R05 Cough: Secondary | ICD-10-CM

## 2018-02-14 DIAGNOSIS — R059 Cough, unspecified: Secondary | ICD-10-CM

## 2018-02-14 MED ORDER — PREDNISONE 20 MG PO TABS: 40.0000 mg | ORAL_TABLET | Freq: Every day | ORAL | 0 refills | Status: DC

## 2018-02-14 NOTE — Telephone Encounter (Signed)
Noted  

## 2018-02-14 NOTE — Assessment & Plan Note (Signed)
Rx for prednisone given lung disease. Likely viral so antibiotics at not indicated at this time. Lungs clear on exam so chest x-ray not indicated.

## 2018-02-14 NOTE — Telephone Encounter (Signed)
Called in c/o shortness of breath that started last Wednesday.  She also has a headache and nausea that started Wednesday too.   She got her first iron infusion on last Tuesday for anemia.   She began feeling bad Wednesday evening.   Doesn't know if it's related or not.   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   Her daughter who lives with her was also sick with vomiting for 24 hours 2 days ago.   See triage notes below.  The protocol is for her to be seen within 4 hours.   She requested an afternoon appt so she could be some rest before coming in.   "I didn't sleep well last night and I'm tired".     I made her an appt at her request with Dr. Sharlet Salina for today (02/14/14) at 3:00.  I instructed her to go to the ED if her shortness of breath became worse or she had chest discomfort or worsening of her symptoms prior to her appt.   She verbalized understanding and was agreeable to this plan.   Reason for Disposition . [1] MILD difficulty breathing (e.g., minimal/no SOB at rest, SOB with walking, pulse <100) AND [2] NEW-onset or WORSE than normal  Answer Assessment - Initial Assessment Questions 1. RESPIRATORY STATUS: "Describe your breathing?" (e.g., wheezing, shortness of breath, unable to speak, severe coughing)      Not coughing up anything.   My voice is raspy.  I had to sit up to sleep.   2. ONSET: "When did this breathing problem begin?"      Wednesday started with shortness of breath.   I had an iron infusion for anemia it was my first infusion on last Tuesday.   The shortness of breath started on Wednesday.      3. PATTERN "Does the difficult breathing come and go, or has it been constant since it started?"      The last 48 hours constant.   I walk even a little ways and I'm exhausted. 4. SEVERITY: "How bad is your breathing?" (e.g., mild, moderate, severe)    - MILD: No SOB at rest, mild SOB with walking, speaks normally in sentences, can lay  down, no retractions, pulse < 100.    - MODERATE: SOB at rest, SOB with minimal exertion and prefers to sit, cannot lie down flat, speaks in phrases, mild retractions, audible wheezing, pulse 100-120.    - SEVERE: Very SOB at rest, speaks in single words, struggling to breathe, sitting hunched forward, retractions, pulse > 120      I can't walk but a few steps and I'm exhausted. 5. RECURRENT SYMPTOM: "Have you had difficulty breathing before?" If so, ask: "When was the last time?" and "What happened that time?"      I have A. Fib and asthma so it's hard to tell what's going on with me.  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   Maybe caught a virus. 6. CARDIAC HISTORY: "Do you have any history of heart disease?" (e.g., heart attack, angina, bypass surgery, angioplasty)      A.Fib.  I've had a stent.  Taking Xalerlto  7. LUNG HISTORY: "Do you have any history of lung disease?"  (e.g., pulmonary embolus, asthma, emphysema)     Asthma.    No blood clots in lungs or legs.   8. CAUSE: "What do you think is causing the breathing problem?"  I don't know.   It's a little different these past 2 days than my usual.  I feel worse.   Maybe combination of A. Fib and bronchial congestion maybe. 9. OTHER SYMPTOMS: "Do you have any other symptoms? (e.g., dizziness, runny nose, cough, chest pain, fever)     Post nasal drip, Headache and nausea.   No sore throat or earache.   Headache started Wednesday evening and the nausea too.   No vomiting or diarrhea. 10. PREGNANCY: "Is there any chance you are pregnant?" "When was your last menstrual period?"       Not asked due to age 67. 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   No travel.  My older daughter was sick throwing up 2 days ago.  She's fine now it was just 24 hours.   She lives with you.  Protocols used: BREATHING  DIFFICULTY-A-AH

## 2018-02-14 NOTE — Addendum Note (Signed)
Addended by: Pricilla Holm A on: 02/14/2018 12:35 PM   Modules accepted: Orders

## 2018-02-14 NOTE — Progress Notes (Signed)
   Subjective:    Patient ID: [name], female    DOB: [birth year], 67 y.o.   MRN: [id number]  HPI The patient is a 67 YO female coming in for SOB. Started about 2 days ago. Has a non-productive cough along with this. She denies fevers or chills. Denies swelling. Also having some headache and nausea. Her family is all sick with similar. She is having SOB which is ongoing but worse in the last 2 days. Denies diarrhea. Some nausea but no vomiting. Using symbicort but not consistent as this makes her heart race and she does not want to feel like this.   Review of Systems  Constitutional: Positive for activity change and appetite change. Negative for chills, fatigue, fever and unexpected weight change.  HENT: Positive for congestion, postnasal drip and rhinorrhea. Negative for ear discharge, ear pain, sinus pressure, sinus pain, sneezing, sore throat, tinnitus, trouble swallowing and voice change.   Eyes: Negative.   Respiratory: Positive for cough and shortness of breath. Negative for chest tightness and wheezing.   Cardiovascular: Negative.   Gastrointestinal: Negative.   Musculoskeletal: Negative for myalgias.  Neurological: Negative.       Objective:   Physical Exam  Constitutional: She is oriented to person, place, and time. She appears well-developed and well-nourished.  HENT:  Head: Normocephalic and atraumatic.  Oropharynx with redness and clear drainage, nose with swollen turbinates, TMs normal bilaterally  Eyes: EOM are normal.  Neck: Normal range of motion. No thyromegaly present.  Cardiovascular: Normal rate and regular rhythm.  Pulmonary/Chest: Effort normal and breath sounds normal. No respiratory distress. She has no wheezes. She has no rales.  Abdominal: Soft.  Musculoskeletal: She exhibits tenderness.  Lymphadenopathy:    She has no cervical adenopathy.  Neurological: She is alert and oriented to person, place, and time.  Skin: Skin is warm and dry.   Vitals:   02/14/18 1506  BP: 120/60  Pulse: 62  Temp: 97.7 F (36.5 C)  TempSrc: Oral  SpO2: 99%  Weight: 193 lb (87.5 kg)  Height: 5\' 2"  (1.575 m)      Assessment & Plan:

## 2018-02-14 NOTE — Patient Instructions (Addendum)
We have sent in prednisone to take if needed. If needed take 2 pills daily for 5 days.

## 2018-02-18 DIAGNOSIS — D500 Iron deficiency anemia secondary to blood loss (chronic): Secondary | ICD-10-CM | POA: Diagnosis not present

## 2018-02-18 DIAGNOSIS — R195 Other fecal abnormalities: Secondary | ICD-10-CM | POA: Diagnosis not present

## 2018-02-20 ENCOUNTER — Ambulatory Visit (INDEPENDENT_AMBULATORY_CARE_PROVIDER_SITE_OTHER): Payer: PPO | Admitting: Orthotics

## 2018-02-20 DIAGNOSIS — M79672 Pain in left foot: Secondary | ICD-10-CM

## 2018-02-20 DIAGNOSIS — M779 Enthesopathy, unspecified: Secondary | ICD-10-CM

## 2018-02-20 NOTE — Progress Notes (Signed)
Patient came in today to pick up custom made foot orthotics.  The goals were accomplished and the patient reported no dissatisfaction with said orthotics.  Patient was advised of breakin period and how to report any issues. 

## 2018-02-27 ENCOUNTER — Ambulatory Visit: Payer: PPO | Admitting: Podiatry

## 2018-03-04 ENCOUNTER — Encounter: Payer: Self-pay | Admitting: Podiatry

## 2018-03-05 ENCOUNTER — Ambulatory Visit: Payer: PPO | Admitting: Neurology

## 2018-03-05 ENCOUNTER — Encounter

## 2018-03-14 ENCOUNTER — Other Ambulatory Visit: Payer: Self-pay | Admitting: Cardiovascular Disease

## 2018-03-18 ENCOUNTER — Other Ambulatory Visit (INDEPENDENT_AMBULATORY_CARE_PROVIDER_SITE_OTHER): Payer: PPO

## 2018-03-18 DIAGNOSIS — D500 Iron deficiency anemia secondary to blood loss (chronic): Secondary | ICD-10-CM | POA: Diagnosis not present

## 2018-03-18 LAB — CBC
HCT: 35.3 % — ABNORMAL LOW (ref 36.0–46.0)
Hemoglobin: 11.5 g/dL — ABNORMAL LOW (ref 12.0–15.0)
MCHC: 32.6 g/dL (ref 30.0–36.0)
MCV: 79.7 fl (ref 78.0–100.0)
Platelets: 231 10*3/uL (ref 150.0–400.0)
RBC: 4.43 Mil/uL (ref 3.87–5.11)
RDW: 30.4 % — ABNORMAL HIGH (ref 11.5–15.5)
WBC: 6.9 10*3/uL (ref 4.0–10.5)

## 2018-03-19 LAB — IRON,TIBC AND FERRITIN PANEL
%SAT: 15 % (calc) — ABNORMAL LOW (ref 16–45)
Ferritin: 55 ng/mL (ref 16–288)
Iron: 67 ug/dL (ref 45–160)
TIBC: 442 mcg/dL (calc) (ref 250–450)

## 2018-03-19 LAB — SPECIMEN COMPROMISED

## 2018-03-20 ENCOUNTER — Other Ambulatory Visit: Payer: Self-pay | Admitting: Podiatry

## 2018-03-20 ENCOUNTER — Ambulatory Visit (INDEPENDENT_AMBULATORY_CARE_PROVIDER_SITE_OTHER): Payer: PPO

## 2018-03-20 ENCOUNTER — Encounter: Payer: Self-pay | Admitting: Podiatry

## 2018-03-20 ENCOUNTER — Ambulatory Visit: Payer: PPO | Admitting: Podiatry

## 2018-03-20 DIAGNOSIS — M779 Enthesopathy, unspecified: Secondary | ICD-10-CM | POA: Diagnosis not present

## 2018-03-20 DIAGNOSIS — M7751 Other enthesopathy of right foot: Secondary | ICD-10-CM | POA: Diagnosis not present

## 2018-03-20 DIAGNOSIS — M79671 Pain in right foot: Secondary | ICD-10-CM

## 2018-03-20 DIAGNOSIS — M7752 Other enthesopathy of left foot: Secondary | ICD-10-CM | POA: Diagnosis not present

## 2018-03-20 MED ORDER — TRIAMCINOLONE ACETONIDE 10 MG/ML IJ SUSP
10.0000 mg | Freq: Once | INTRAMUSCULAR | Status: AC
  Administered 2018-03-20: 10 mg

## 2018-03-20 NOTE — Progress Notes (Signed)
Subjective:   Patient ID: [name], female   DOB: 67 y.o.   MRN: [id number]   HPI Patient presents stating she is getting some pain in the top of both feet and it makes it hard for her to walk comfortably   ROS      Objective:  Physical Exam  Neurovascular status intact with inflammation of the dorsum of the metatarsal cuneiform joint bilateral with fluid buildup     Assessment:  Acute tendinitis of the midtarsal joint area bilateral with probable arthritis     Plan:  H&P discussed lacing shoes differently and ice therapy as needed and I went ahead did injection of the dorsal tendon complex bilateral 3 mg Kenalog 5 mg Xylocaine and will be seen back depending on results of this treatment  X-ray indicates spur formation with mild arthritis midtarsal joint bilateral

## 2018-03-22 ENCOUNTER — Other Ambulatory Visit: Payer: Self-pay | Admitting: Internal Medicine

## 2018-04-01 ENCOUNTER — Ambulatory Visit (HOSPITAL_COMMUNITY)
Admission: RE | Admit: 2018-04-01 | Discharge: 2018-04-01 | Disposition: A | Discharge status: Home / Self Care | Payer: PPO | Source: Ambulatory Visit | Attending: Nurse Practitioner | Admitting: Nurse Practitioner

## 2018-04-01 VITALS — BP 124/66 | HR 58 | Ht 62.0 in | Wt 182.6 lb

## 2018-04-01 DIAGNOSIS — I2510 Atherosclerotic heart disease of native coronary artery without angina pectoris: Secondary | ICD-10-CM | POA: Diagnosis not present

## 2018-04-01 DIAGNOSIS — Z7952 Long term (current) use of systemic steroids: Secondary | ICD-10-CM | POA: Diagnosis not present

## 2018-04-01 DIAGNOSIS — Z87891 Personal history of nicotine dependence: Secondary | ICD-10-CM | POA: Insufficient documentation

## 2018-04-01 DIAGNOSIS — I10 Essential (primary) hypertension: Secondary | ICD-10-CM | POA: Diagnosis not present

## 2018-04-01 DIAGNOSIS — Z7951 Long term (current) use of inhaled steroids: Secondary | ICD-10-CM | POA: Insufficient documentation

## 2018-04-01 DIAGNOSIS — M4800 Spinal stenosis, site unspecified: Secondary | ICD-10-CM | POA: Diagnosis not present

## 2018-04-01 DIAGNOSIS — Z7901 Long term (current) use of anticoagulants: Secondary | ICD-10-CM | POA: Insufficient documentation

## 2018-04-01 DIAGNOSIS — E785 Hyperlipidemia, unspecified: Secondary | ICD-10-CM | POA: Diagnosis not present

## 2018-04-01 DIAGNOSIS — I481 Persistent atrial fibrillation: Secondary | ICD-10-CM | POA: Diagnosis not present

## 2018-04-01 DIAGNOSIS — Z79899 Other long term (current) drug therapy: Secondary | ICD-10-CM | POA: Diagnosis not present

## 2018-04-01 DIAGNOSIS — I4821 Permanent atrial fibrillation: Secondary | ICD-10-CM

## 2018-04-01 DIAGNOSIS — K219 Gastro-esophageal reflux disease without esophagitis: Secondary | ICD-10-CM | POA: Diagnosis not present

## 2018-04-01 DIAGNOSIS — I482 Chronic atrial fibrillation: Secondary | ICD-10-CM

## 2018-04-01 DIAGNOSIS — E1139 Type 2 diabetes mellitus with other diabetic ophthalmic complication: Secondary | ICD-10-CM | POA: Diagnosis not present

## 2018-04-01 DIAGNOSIS — H42 Glaucoma in diseases classified elsewhere: Secondary | ICD-10-CM | POA: Insufficient documentation

## 2018-04-01 DIAGNOSIS — E669 Obesity, unspecified: Secondary | ICD-10-CM | POA: Diagnosis not present

## 2018-04-01 DIAGNOSIS — Z7984 Long term (current) use of oral hypoglycemic drugs: Secondary | ICD-10-CM | POA: Insufficient documentation

## 2018-04-01 DIAGNOSIS — J449 Chronic obstructive pulmonary disease, unspecified: Secondary | ICD-10-CM | POA: Diagnosis not present

## 2018-04-01 DIAGNOSIS — Z8249 Family history of ischemic heart disease and other diseases of the circulatory system: Secondary | ICD-10-CM | POA: Insufficient documentation

## 2018-04-01 DIAGNOSIS — R0683 Snoring: Secondary | ICD-10-CM | POA: Insufficient documentation

## 2018-04-01 DIAGNOSIS — Z7982 Long term (current) use of aspirin: Secondary | ICD-10-CM | POA: Insufficient documentation

## 2018-04-01 DIAGNOSIS — Z888 Allergy status to other drugs, medicaments and biological substances status: Secondary | ICD-10-CM | POA: Insufficient documentation

## 2018-04-01 DIAGNOSIS — I480 Paroxysmal atrial fibrillation: Secondary | ICD-10-CM | POA: Insufficient documentation

## 2018-04-01 DIAGNOSIS — R001 Bradycardia, unspecified: Secondary | ICD-10-CM | POA: Diagnosis not present

## 2018-04-01 DIAGNOSIS — I4891 Unspecified atrial fibrillation: Secondary | ICD-10-CM | POA: Diagnosis present

## 2018-04-01 NOTE — Addendum Note (Signed)
Encounter addended by: Sherran Needs, NP on: 04/01/2018 2:51 PM  Actions taken: Sign clinical note

## 2018-04-01 NOTE — Progress Notes (Addendum)
Patient ID: [name], female   DOB: [birth year], 67 y.o.   MRN: [id number]     Primary Care Physician: Hoyt Koch, MD Referring Physician: Dr. Ronald Lobo [name] is a 67 y.o. female with a h/o CAD, DM, HTN, obesity, PAF first diagnosed May of 2015 with successful DCCV x2, first in May 2015.Marland KitchenShe recently had an AF episode and metoprolol succinate was increased to 150 mg qd. She converted on this dose and has been maintaining SR.  No previous AAD. Chads vasc score of at least 5, on xarelto daily.   On last visit in the afib clinic, we discussed lifestyle triggers. Pt does not  smoke or drink alcohol.Her BP/Hgba1c is controlled. She is sedentary. She does snore, no previous sleep study. Sleep study is pending. We also discussed antiarrythmic, which she was not interested in at that time.  She saw Dr. Gwenlyn Found and was in afib. Amlodipine was stopped and cardizem was started. She converted within the day. Discussed AAD therapy  again, but she is still not interested since she is back in rhythm and would prefer to take a wait and see approach.  She is in the afib clinic 09/26/15 with c/o being in afib x 2 weeks. Symptomatic with fatigue and dyspnea with exertion. We discussed options to restore SR. She is not interested in antiarrythmic's at this time, would like a cardioversion. Last one held her in rhythm since 5/16. If she has ERAF, she will consider AAD.  She returns, 11/26/15, in [place] but is surprised her energy is not much better in SR. She has a mild cough, but mostly fatigue. She has a sleep study, pending. She had a stress test last summer which was low risk. Has noticed some increase in BP.  Pt was being seen again, 06/2016, after some months with c/o afib for several weeks. She  has fatigue with afib. Again, options were discussed and she is still very resultant to start antiarrythmic's. She would not be a candidate for  1C agents. She is not interested in being hospitalized for a  med and is concerned re cost of multaq/tiksoyn. Side effects of amiodarone make her hesitatnt to use this drug, plus she is on the young side for this. Decided on cardioversion.  F/u in afib clinic s/p cardioversion, 06/22/16.She unfortunately has had ERAF. She felt great yesterday but was unaware that she was in afib today. Rate controlled but with v rate of upper 90's and could benefit from more rate control. Again, options were   discussed and she is not candidate for 1c agents, does not want to stop Paxil to allow for tikosyn to be used. She is pending echo 12/1 and a sleep study. Multaq may be an option in conjunction with another cardioversion at a later date.   F/u in afib clinic 07/18/16. She continues to be in rate controlled afib. She visited her family in [place] over [place],and fell and fx her foot which is in a boot. We discussed antiarrythmic's as well and she still can not decide on a good option. She is not willing to come off antidepressant which would be an issue with qtc prolonging drugs such as multaq, sotalol, tikosyn. She had to delay her sleep study until January due to foot fx. Echo updated and stable.  F/u in the afib clinic 4/4. She continues in rate controlled afib. We have discussed antiarrythmic's but she does not like any of the options given. She feels that she  is tolerating the afib OK. Continues on xarelto without bleeding issues. Saw Kerin Ransom for general cardiology concerns/CAD and thought to be stable. Pt states that she has felt bad for a couple of weeks but thought it was due to chest congestion. Feeling better today.  F/u in afib clinic, 05/08/17. She had a good summer. She continues in afib, rate controlled. Again, options to restore SR discussed and she wants to stay as she is. As long as she paces herself, she feels that her quality of life  is good.  F/u with afib clinic, 2/26, she has done well but had 2 episodes of afib with RVR in the last 2 weeks. She has  had issues with some foot pain and one episode was in the orthopedists office. Another episode was one night that she was not sleeping well. She used prn Cardizem and HR settled down. Other wise no issues over the last week. Still is content to stay in afib. It is well rate controlled today.  F/u in fib clinic, 8/20. She is in permanent afib. She feels great right now. She does have afib with slow ventricular response. She is asymptomatic.   Today, she denies symptoms of palpitations, chest pain, shortness of breath, orthopnea, PND, lower extremity edema, dizziness, presyncope, syncope, or neurologic sequela. The patient is tolerating medications without difficulties and is otherwise without complaint today.   Past Medical History:  Diagnosis Date  . Anemia, unspecified   . Arthritis   . Asthma   . Atrial fibrillation with RVR ([place])    a. on Xarelto  . Bell's palsy    Facial nerve decompression in 2001  . Chronic low back pain   . COPD with asthma ([place])   . Coronary artery disease    Myoview 04/12/11 was entirely normal. ECHO 02/26/08 showed only minor abnormalities. Stenting 05/26/08 of her posterolateral branch to the left circumflex coronary artery. Used a 2.5x62mm Taxus Monorail stent.myoview 2014 was without ischemia  . Diabetes mellitus    Type 2  . Early cataracts, bilateral   . Fatty liver   . GERD (gastroesophageal reflux disease)   . Glaucoma   . Glaucoma   . Goiter   . Heart murmur   . History of nuclear stress test 2012; 2014   lexiscan; normal pattern of perfusion; normal, low risk scan   . Hyperlipidemia   . Hypertension   . Panic disorder   . Pneumonia 2008  . Polycystic ovary disease    Hysterectomy in 1982 for this  . Shortness of breath dyspnea    ECHO 02/26/08 showed only minor abnormalities  . Spinal stenosis    Past Surgical History:  Procedure Laterality Date  . ABDOMINAL HYSTERECTOMY  1982   & BSO; for polycystic ovary disease  . CARDIOVERSION N/A  12/17/2013   Procedure: CARDIOVERSION;  Surgeon: Pixie Casino, MD;  Location: [facility] ENDOSCOPY;  Service: Cardiovascular;  Laterality: N/A;  . CARDIOVERSION N/A 09/30/2015   Procedure: CARDIOVERSION;  Surgeon: Dorothy Spark, MD;  Location: [facility] ENDOSCOPY;  Service: Cardiovascular;  Laterality: N/A;  . CARDIOVERSION N/A 06/22/2016   Procedure: CARDIOVERSION;  Surgeon: Skeet Latch, MD;  Location: [place];  Service: Cardiovascular;  Laterality: N/A;  . COLONOSCOPY     last 2009; Dr Cristina Gong; due 2019  . COLONOSCOPY WITH PROPOFOL N/A 01/23/2018   Procedure: COLONOSCOPY WITH PROPOFOL;  Surgeon: Ronald Lobo, MD;  Location: [place];  Service: Endoscopy;  Laterality: N/A;  . CORONARY ANGIOPLASTY  05/26/2008  Stenting of her posterolateral branch to the left circumflex coronary artery. Used a 2.5x61mm Taxus Monorail stent.  . ESOPHAGOGASTRODUODENOSCOPY (EGD) WITH PROPOFOL N/A 01/23/2018   Procedure: ESOPHAGOGASTRODUODENOSCOPY (EGD) WITH PROPOFOL;  Surgeon: Ronald Lobo, MD;  Location: [place];  Service: Endoscopy;  Laterality: N/A;  . FACIAL NERVE DECOMPRESSION  2001/2002   bells palsy   . LAPAROSCOPIC CHOLECYSTECTOMY  06/15/2011    Dr Dalbert Batman  . TEE WITHOUT CARDIOVERSION N/A 12/17/2013   Procedure: TRANSESOPHAGEAL ECHOCARDIOGRAM (TEE);  Surgeon: Pixie Casino, MD;  Location: [facility] ENDOSCOPY;  Service: Cardiovascular;  Laterality: N/A;  trish/ja  . TRANSTHORACIC ECHOCARDIOGRAM  0/96/0454   LV systolic function normal with mild conc LVH; LA mildly dilated; trace MR/TR  . UPPER GI ENDOSCOPY  2009   negative    Current Outpatient Medications  Medication Sig Dispense Refill  . ascorbic acid (VITAMIN C) 1000 MG tablet Take 1,000 mg by mouth 2 (two) times daily.     [name] aspirin 325 MG tablet Take 325 mg by mouth at bedtime.    . budesonide-formoterol (SYMBICORT) 160-4.5 MCG/ACT inhaler USE 2 PUFFS TWICE DAILY (Patient taking differently: Inhale 2 puffs into the lungs 2 (two) times  daily as needed (asthma). USE 2 PUFFS TWICE DAILY AS NEEDED) 10.2 g 4  . CALCIUM-MAG-VIT C-VIT D PO Take 1 tablet by mouth daily.     . celecoxib (CELEBREX) 200 MG capsule Take 200 mg by mouth 2 (two) times daily.     . Continuous Blood Gluc Sensor (FREESTYLE LIBRE SENSOR SYSTEM) MISC Use to monitor sugars 1 each 0  . CRESTOR 10 MG tablet TAKE ONE TABLET AT BEDTIME. 30 tablet 6  . diltiazem (CARDIZEM CD) 180 MG 24 hr capsule TAKE 1 CAPSULE DAILY. 90 capsule 3  . gabapentin (NEURONTIN) 100 MG capsule Take 1 capsule (100 mg total) by mouth 3 (three) times daily. 180 capsule 6  . guaiFENesin (MUCINEX) 600 MG 12 hr tablet Take 600 mg by mouth 2 (two) times daily.    [name] HYDROcodone-acetaminophen (NORCO/VICODIN) 5-325 MG tablet Take 1 tablet by mouth daily as needed for moderate pain.   0  . INVOKANA 100 MG TABS tablet TAKE 1 TABLET ONCE DAILY. 30 tablet 2  . isosorbide mononitrate (IMDUR) 30 MG 24 hr tablet Take 15 mg by mouth daily.    [name] latanoprost (XALATAN) 0.005 % ophthalmic solution Place 1 drop into both eyes at bedtime.    . levalbuterol (XOPENEX) 0.63 MG/3ML nebulizer solution Take 3 mLs (0.63 mg total) by nebulization every 6 (six) hours as needed for wheezing or shortness of breath. 75 mL 1  . LORazepam (ATIVAN) 0.5 MG tablet TAKE 1 OR 2 TABLETS AT BEDTIME. 60 tablet 5  . Melatonin 5 MG TABS Take 1 tablet by mouth at bedtime.     . metoprolol succinate (TOPROL-XL) 50 MG 24 hr tablet TAKE 3 TABLETS ONCE DAILY WITH FOOD. 90 tablet 0  . Multiple Vitamins-Iron (MULTIVITAMINS WITH IRON) TABS Take 1 tablet by mouth 2 (two) times daily.     [name] nystatin ointment (MYCOSTATIN) Apply 1 application topically 2 (two) times daily as needed (IRRITATION).   0  . omega-3 acid ethyl esters (LOVAZA) 1 G capsule Take 1 g by mouth daily.     [name] omeprazole (PRILOSEC) 20 MG capsule Take 2 capsules (40 mg total) by mouth daily. 60 capsule 0  . ONETOUCH DELICA LANCETS FINE MISC 1 Units by Does not apply route as  directed. 100 each 3  . PARoxetine (PAXIL)  10 MG tablet Take 2.5 tablets (25 mg total) by mouth daily. 225 tablet 11  . predniSONE (DELTASONE) 20 MG tablet Take 2 tablets (40 mg total) by mouth daily with breakfast. 10 tablet 0  . quinapril (ACCUPRIL) 40 MG tablet TAKE 1 TABLET ONCE DAILY. 90 tablet 1  . rOPINIRole (REQUIP XL) 2 MG 24 hr tablet Take 1 tablet (2 mg total) by mouth at bedtime. 30 tablet 5  . rOPINIRole (REQUIP) 0.5 MG tablet Take 1 tab at midnight. 60 tablet 6  . senna (SENOKOT) 8.6 MG TABS Take 1 tablet by mouth daily.     . sitaGLIPtin-metformin (JANUMET) 50-1000 MG tablet TAKE 1/2 TABLET IN THE MORNING WITH BREAKFAST AND 1 TABLET IN THE EVENING WITH SUPPER. (Patient taking differently: Take 0.5-1 tablets by mouth See admin instructions. TAKE 1/2 TABLET IN THE MORNING WITH BREAKFAST AND 1 TABLET IN THE EVENING WITH SUPPER.) 140 tablet 6  . XARELTO 20 MG TABS tablet TAKE 1 TABLET ONCE DAILY WITH SUPPER. 30 tablet 5  . XOPENEX HFA 45 MCG/ACT inhaler USE 1 TO 2 PUFFS EVERY 6 HOURS AS NEEDED FOR WHEEZING. 15 g 0  . estradiol (ESTRACE) 0.5 MG tablet Take 0.5 mg by mouth daily.      No current facility-administered medications for this encounter.     Allergies  Allergen Reactions  . Benadryl [Diphenhydramine Hcl] Other (See Comments)    Restless leg  . Clopidogrel Bisulfate Nausea Only    Nausea & pain  . Contrast Media [Iodinated Diagnostic Agents] Palpitations    Rapid heart rate, hot    Social History   Socioeconomic History  . Marital status: Married    Spouse name: Not on file  . Number of children: 2  . Years of education: master's  . Highest education level: Not on file  Occupational History  . Occupation: Forensic scientist: [employer]  . Financial resource strain: Not on file  . Food insecurity:    Worry: Not on file    Inability: Not on file  . Transportation needs:    Medical: Not on file    Non-medical: Not on file    Tobacco Use  . Smoking status: Former Smoker    Packs/day: 1.50    Years: 30.00    Pack years: 45.00    Types: Cigarettes    Last attempt to quit: 08/13/1993    Years since quitting: 24.6  . Smokeless tobacco: Never Used  Substance and Sexual Activity  . Alcohol use: No  . Drug use: No  . Sexual activity: Not on file  Lifestyle  . Physical activity:    Days per week: Not on file    Minutes per session: Not on file  . Stress: Not on file  Relationships  . Social connections:    Talks on phone: Not on file    Gets together: Not on file    Attends religious service: Not on file    Active member of club or organization: Not on file    Attends meetings of clubs or organizations: Not on file    Relationship status: Not on file  . Intimate partner violence:    Fear of current or ex partner: Not on file    Emotionally abused: Not on file    Physically abused: Not on file    Forced sexual activity: Not on file  Other Topics Concern  . Not on file  Social History Narrative  .  Not on file    Family History  Problem Relation Age of Onset  . Heart attack Father 35       2nd MI at 23  . Colon cancer Brother 22  . Gout Brother   . Ulcers Mother   . Emphysema Mother 40  . Colon polyps Sister   . Cancer Sister        Basal cell carcinoma  . Pneumonia Maternal Grandmother   . Hypertension Brother   . Hyperlipidemia Brother   . Cancer Brother        Skin  . Diabetes Neg Hx   . Stroke Neg Hx     ROS- All systems are reviewed and negative except as per the HPI above  Physical Exam: Vitals:   04/01/18 1412  BP: 124/66  Pulse: (!) 58  Weight: 82.8 kg  Height: 5\' 2"  (1.575 m)    GEN- The patient is well appearing, alert and oriented x 3 today.   Head- normocephalic, atraumatic Eyes-  Sclera clear, conjunctiva pink Ears- hearing intact Oropharynx- clear Neck- supple, no JVP Lymph- no cervical lymphadenopathy Lungs- Clear to ausculation bilaterally, normal work of  breathing Heart- irregular rate and rhythm, no murmurs, rubs or gallops, PMI not laterally displaced GI- soft, NT, ND, + BS Extremities- no clubbing, cyanosis, or edema MS- no significant deformity or atrophy Skin- no rash or lesion Psych- euthymic mood, full affect Neuro- strength and sensation are intact  EKG-afib with svr at 49 bpm, qrs int 76 ms, qtc 390 ms Epic records reviewed  Echo Study Conclusions 07/13/16  - Left ventricle: The cavity size was normal. Wall thickness was   increased in a pattern of mild LVH. Systolic function was normal.   The estimated ejection fraction was in the range of 60% to 65%.   Incoordinate septal motion. Low normal GLPSS at -18% with mild   anteroseptal and inferoseptal strain abnormality. The study is   not technically sufficient to allow evaluation of LV diastolic   function. - Aortic valve: Sclerosis without stenosis. There was no   regurgitation. - Mitral valve: Calcified annulus. There was mild regurgitation. - Left atrium: Moderately dilated. - Right ventricle: The cavity size was mildly dilated. Systolic   function was low normal. - Right atrium: The atrium was mildly dilated. - Inferior vena cava: The vessel was normal in size. The   respirophasic diameter changes were in the normal range (= 50%),   consistent with normal central venous pressure.  Impressions:  - Compared to a prior echo in 2015, the LVEF is higher at 60-65%.   There is now moderate LAE and mild RAE, mild MR and atrial   fibrillation is noted.    Assessment and Plan:  1. Permanent  afib Previous successful cardioversion 06/22/16 but ERAF Today she has slow v response Discussed lowering BB but she feels so well for now she does not want to change anything She will chck her HR/BP at home for the next week and will let me know numbers If HR consistently  in the 40's, low 50's, I would lower BB to 100 mg daily from 150 mg daily Discussed all antiarrythmic's  in the past  but she was unwilling to stop antidepressant which has qtc prolonging effect  On young side for amiodrone 1c agents are not an option due to CAD Was not an candidate for ablation due to being persistent x one year, never being on antiarrythmic Continue  xarelto for chadsvasc score of  5  2. Lifestyle risk factors Her weight is down 6 lbs Continue with lifestyle efforts Has spinal stenosis and does not exercise Encouraged to water walk or try recumbent bike which the pt thinks she can do without pain.  3. Snoring/possible sleep apnea Pt never carried thru with sleep study   4. HTN stable  5. CAD No current angina symptoms  F/u in 6-9  months  Geroge Baseman. Laticia Vannostrand, [facility] [address] [place], [place] [address] [phone]

## 2018-04-03 ENCOUNTER — Other Ambulatory Visit: Payer: Self-pay | Admitting: *Deleted

## 2018-04-03 ENCOUNTER — Telehealth: Payer: Self-pay | Admitting: Cardiovascular Disease

## 2018-04-03 MED ORDER — ISOSORBIDE MONONITRATE ER 30 MG PO TB24: 15.0000 mg | ORAL_TABLET | Freq: Every day | ORAL | 2 refills | Status: DC

## 2018-04-03 NOTE — Telephone Encounter (Signed)
Message not needed, pharmacy received [facility]

## 2018-04-03 NOTE — Telephone Encounter (Signed)
New Message    Pt c/o medication issue:  1. Name of Medication: isosorbide mononitrate (IMDUR) 30 MG 24 hr tablet  2. How are you currently taking this medication (dosage and times per day)?   3. Are you having a reaction (difficulty breathing--STAT)?  4. What is your medication issue? [facility] is calling to confirm what dosage that the patient should be taking.

## 2018-04-04 ENCOUNTER — Ambulatory Visit: Payer: Self-pay | Admitting: *Deleted

## 2018-04-04 DIAGNOSIS — D500 Iron deficiency anemia secondary to blood loss (chronic): Secondary | ICD-10-CM

## 2018-04-04 NOTE — Telephone Encounter (Signed)
Pt calling with complaints of SOB, dizziness, headache and nausea. Pt states that symptoms started on yesterday but  have become worse this afternoon. Pt states she is experiencing SOB at this time with no wheezing but states that her chest does feel tight at the sternum.Pt states she feels unbalanced and has a headache which she usually does not have and has taken medication for headache which "did not touch the pain".Pt states the way she feels now is  similar to what she experienced when she was severely anemic. Pt states she has a history of chronic rectal bleeding and has undergone an upper and lower endoscopy. Pt denies having any bloody stools at this time. Pt advised to seek treatment in the ED for current symptoms. Pt verbalized understanding but pt states stating she does not want to sit in the waiting room for 5 hours and if she becomes worse she will go to the ED.  Reason for Disposition . [1] MODERATE difficulty breathing (e.g., speaks in phrases, SOB even at rest, pulse 100-120) AND [2] NEW-onset or WORSE than normal  Answer Assessment - Initial Assessment Questions 1. RESPIRATORY STATUS: "Describe your breathing?" (e.g., wheezing, shortness of breath, unable to speak, severe coughing)      Short of breath 2. ONSET: "When did this breathing problem begin?"      Yesterday  3. PATTERN "Does the difficult breathing come and go, or has it been constant since it started?"      Comes and goes and feels real tight at sternum 4. SEVERITY: "How bad is your breathing?" (e.g., mild, moderate, severe)    - MILD: No SOB at rest, mild SOB with walking, speaks normally in sentences, can lay down, no retractions, pulse < 100.    - MODERATE: SOB at rest, SOB with minimal exertion and prefers to sit, cannot lie down flat, speaks in phrases, mild retractions, audible wheezing, pulse 100-120.    - SEVERE: Very SOB at rest, speaks in single words, struggling to breathe, sitting hunched forward, retractions,  pulse > 120      moderate 5. RECURRENT SYMPTOM: "Have you had difficulty breathing before?" If so, ask: "When was the last time?" and "What happened that time?"      Yes, when I was dealing with anemia 6. CARDIAC HISTORY: "Do you have any history of heart disease?" (e.g., heart attack, angina, bypass surgery, angioplasty)      Yes and has one stent,DM 7. LUNG HISTORY: "Do you have any history of lung disease?"  (e.g., pulmonary embolus, asthma, emphysema)     asthma 8. CAUSE: "What do you think is causing the breathing problem?"      unsure 9. OTHER SYMPTOMS: "Do you have any other symptoms? (e.g., dizziness, runny nose, cough, chest pain, fever)    Dizziness and runny nose, dry cough 10. PREGNANCY: "Is there any chance you are pregnant?" "When was your last menstrual period?"       no 11. TRAVEL: "Have you traveled out of the country in the last month?" (e.g., travel history, exposures)       No  Answer Assessment - Initial Assessment Questions 1. DESCRIPTION: "Describe your dizziness."     Feels dizzy 2. LIGHTHEADED: "Do you feel lightheaded?" (e.g., somewhat faint, woozy, weak upon standing)     Yes and it requires me to hold onto something 3. VERTIGO: "Do you feel like either you or the room is spinning or tilting?" (i.e. vertigo)     Feels like my  body is tilting, feels like I am not walking correctly 4. SEVERITY: "How bad is it?"  "Do you feel like you are going to faint?" "Can you stand and walk?"   - MILD - walking normally   - MODERATE - interferes with normal activities (e.g., work, school)    - SEVERE - unable to stand, requires support to walk, feels like passing out now.      Has to hold on to something with walking and standing but does not feel like I am going to pass out 5. ONSET:  "When did the dizziness begin?"     yesterday 6. AGGRAVATING FACTORS: "Does anything make it worse?" (e.g., standing, change in head position)     Changing head positions and standing. 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 HEART RATE: "Can you tell me your heart rate?" "How many beats in 15 seconds?"  (Note: not all patients can do this)       55 has a history of A-fib, BP-147/75 at 6:38pm today 8. CAUSE: "What do you think is causing the dizziness?"     unsure 9. RECURRENT SYMPTOM: "Have you had dizziness before?" If so, ask: "When was the last time?" "What happened that time?"   Yes but not this bad 10. OTHER SYMPTOMS: "Do you have any other symptoms?" (e.g., fever, chest pain, vomiting, diarrhea, bleeding)     Nausea and headache- all over the top and some in forehead  Protocols used: BREATHING DIFFICULTY-A-AH, DIZZINESS - [facility]

## 2018-04-07 NOTE — Addendum Note (Signed)
Addended by: Pricilla Holm A on: 04/07/2018 11:20 AM   Modules accepted: Orders

## 2018-04-07 NOTE — Telephone Encounter (Signed)
Put in CBC to come get drawn at office today.

## 2018-04-07 NOTE — Telephone Encounter (Signed)
LVM informing patient of MD response  

## 2018-04-07 NOTE — Telephone Encounter (Signed)
FYI

## 2018-04-10 ENCOUNTER — Encounter (HOSPITAL_COMMUNITY): Payer: Self-pay

## 2018-04-10 ENCOUNTER — Encounter: Payer: Self-pay | Admitting: Internal Medicine

## 2018-04-15 ENCOUNTER — Other Ambulatory Visit: Payer: Self-pay | Admitting: Cardiovascular Disease

## 2018-04-15 NOTE — Telephone Encounter (Signed)
Rx sent to pharmacy   

## 2018-05-01 ENCOUNTER — Telehealth: Payer: Self-pay | Admitting: Cardiovascular Disease

## 2018-05-01 ENCOUNTER — Telehealth (HOSPITAL_COMMUNITY): Payer: Self-pay | Admitting: Surgery

## 2018-05-01 NOTE — Telephone Encounter (Signed)
[name] called AFib clinic and is concerned regarding her HR and BP.  She says her HR has been 65, 68 and 83 BPM at different checks.  She also reports BP at 153/76, 155/68 and 136/78.  I consulted with Rushie Goltz AFib nurse and she says that patient should call Dr. Kennon Holter office or her PCP regarding her BP.  Patient is in agreement and will call other office today to schedule an appt.

## 2018-05-01 NOTE — Telephone Encounter (Signed)
New message  Pt c/o medication issue:  1. Name of Medication: isosorbide mononitrate (IMDUR) 30 MG 24 hr tablet  2. How are you currently taking this medication (dosage and times per day)? 1/2 tablet daily in morning  3. Are you having a reaction (difficulty breathing--STAT)? No   4. What is your medication issue? Patient wants to know if she can take the other half of this medication to control bp.  Patient states that she is having headaches with her high bp.

## 2018-05-01 NOTE — Telephone Encounter (Signed)
Called patient to get recent BP readings. She states that she started feeling bad yesterday evening and began taking it.   09/18- 9:00pm 136/78 HR83  @10 :30pm 155/68 HR68  She then took the extra half of Imdur, and rechecked BP  @12 :30am 153/76 HR65  When she retook her BP this morning it was back down to normal limits, patient states it was around 116/68.   Patient states she does have Afib, but she has recently had headaches, chest tightness, does mention slight neck pains, SOB, fatigue, and swelling in left leg which was addressed at last OV. Patient denies dizziness.   She would like to know if she can take the extra half of Imdur to keep BP under control, will send to Pharmacy and MD to evaluate if any further changes need to be made.  Patient was advised to go to ED if any sharp chest pains that radiate to arm/neck, patient verbalized understanding.

## 2018-05-01 NOTE — Telephone Encounter (Signed)
Okay for patient to increase  Imdur to 30mg  daily. Not significant effect in BP expected but should improve chest discomfort.   Agree with recommendation to visit ED for sharp chest pain.  We will not adjust BP medication after only 1 day of abnormal readings. Please monitor BP twice daily for next week and call back if BP remains elevated.  Recommend appt with APP for next available due to chest pain and discomfort.

## 2018-05-01 NOTE — Telephone Encounter (Signed)
Called patient, she was advised of message from PharmD. Verbalized understanding to monitor BP and call back if elevated. Made appointment to see Kerin Ransom PA and advised to call office if needed.

## 2018-05-06 DIAGNOSIS — D509 Iron deficiency anemia, unspecified: Secondary | ICD-10-CM | POA: Diagnosis not present

## 2018-05-09 ENCOUNTER — Other Ambulatory Visit (INDEPENDENT_AMBULATORY_CARE_PROVIDER_SITE_OTHER): Payer: PPO

## 2018-05-09 DIAGNOSIS — D500 Iron deficiency anemia secondary to blood loss (chronic): Secondary | ICD-10-CM | POA: Diagnosis not present

## 2018-05-09 LAB — CBC
HCT: 35.4 % — ABNORMAL LOW (ref 36.0–46.0)
Hemoglobin: 11.8 g/dL — ABNORMAL LOW (ref 12.0–15.0)
MCHC: 33.4 g/dL (ref 30.0–36.0)
MCV: 83 fl (ref 78.0–100.0)
Platelets: 292 10*3/uL (ref 150.0–400.0)
RBC: 4.27 Mil/uL (ref 3.87–5.11)
RDW: 21.3 % — ABNORMAL HIGH (ref 11.5–15.5)
WBC: 7.1 10*3/uL (ref 4.0–10.5)

## 2018-05-12 ENCOUNTER — Other Ambulatory Visit: Payer: Self-pay | Admitting: Cardiovascular Disease

## 2018-05-13 ENCOUNTER — Encounter: Payer: Self-pay | Admitting: Internal Medicine

## 2018-05-13 ENCOUNTER — Telehealth: Payer: Self-pay

## 2018-05-13 DIAGNOSIS — D500 Iron deficiency anemia secondary to blood loss (chronic): Secondary | ICD-10-CM | POA: Diagnosis not present

## 2018-05-13 DIAGNOSIS — E611 Iron deficiency: Secondary | ICD-10-CM

## 2018-05-14 DIAGNOSIS — M6281 Muscle weakness (generalized): Secondary | ICD-10-CM | POA: Diagnosis not present

## 2018-05-16 ENCOUNTER — Other Ambulatory Visit: Payer: Self-pay | Admitting: Cardiovascular Disease

## 2018-05-16 NOTE — Telephone Encounter (Signed)
Rx request sent to pharmacy.  

## 2018-05-20 ENCOUNTER — Ambulatory Visit: Payer: PPO | Admitting: Cardiology

## 2018-05-26 NOTE — Progress Notes (Signed)
[facility]  PATIENT: [name] DOB: [birth year]   REASON FOR VISIT: Follow-up for restless leg syndrome HISTORY FROM:patient    HISTORY OF PRESENT ILLNESS:   HPI: 06-10-2017, I had the opportunity of meeting [name] on February 28 last year, about 20 months ago. In the meantime she would like to be further evaluated a past medical history has not significantly changed. Her chief complaints of neither. She continues to be treated on Xarelto for atrial fibrillation with rapid ventricular response, takes Cardizem for rate control, gabapentin, estradiol, Invokana, Symbicort inhaler, Janumet, she still taking Paxil, but she is no longer taking Ultram.  She is still in need of a formal sleep evaluation, her atrial fibrillation has been difficult to control. She has failed 4 cardio-versions. Could not take Tikosyn as it interferes with her paxil.  insisted on continuing Paxil, out of fear to have panic attacks again. 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 She lives with her wife, has a supportive sister. She adopted 2 children.      HPI:  [name] is a 67 y.o. female , seen here as a referral from Dr. Sharlet Salina for a sleep consultation,   Chief complaint according to patient : " insomnia- difficulties to go to sleep , but I can stay asleep" . 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  [name] is a patient with atrial fibrillation and sleep initiation insomnia, she also had a Bell's palsy in 2001, a cholecystectomy in 2012 cardiac stenting in 2009 and a hysterectomy in 1982. Her past medical history includes hypertension, diabetes, hypercholesterolemia, heart disease atrial fibrillation, anxiety and migraine. In her review of systems she endorsed further easy bruising, shortness of breath, chest pain and palpitation being aware of a cardiac murmur, restless legs and headaches. It was upon further recommendation of her  cardiologist at the atrial fibrillation clinic at [facility] where she was recommended to undergo a sleep study. The patient was educated about the correlation between atrial fibrillation and obstructive sleep apnea. She sees Dr. Quay Burow. She has a panic disorder and takes Ativan.   Paroxysmal atrial fibrillation was first diagnosed in May 2015 with a successful conversion 2 first of May of the same year and after that she relapsed into a fib again in September of the same year. She then suffered in early 2017 recurrent episode and her cardiologist increased her metoprolol 250 minute times a day to control the rate. She underwent another cardioversion in feb 2017 ,  converted on the dose and has been maintaining sinus rhythm for one week.  Sleep habits are as follows: She takes Lorazepam at night and melatonin. She sleeps on her right side. She wakes up prone. Bedroom is cold, quiet and dark.  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 Working 2- 8 PM, late dinner, reading. And going to bed after midnight after some " me time" . She sleeps 7-8 hours and often rises at noon. She sleeps with her spouse, she reportedly kicks her wife a lot.  She often wakes with palpitations and noted a very high blood pressure. She has woken diaphoretic.  Her spouse wakes up "a zillion times" but can go to sleep. Delayed sleep cycle for years . On weekends she sleeps 10-12 hours , 4 AM  until 2 PM. She does not nap in daytime. She does drink coffee after waking up and  after work (!).  She wakes with a dry mouth and no headaches.  Sleep medical history and family sleep history: her sister has hypersomnia. Social history:  Married to same sex partner,  2 daughters 33 and 22. No ETOH, No tobacco, little  caffeine.    6-12-2019CDI have the pleasure of seeing [name] today patient I had last encounter prior to scheduling a sleep study for her.  She has a history  of migraine, persistent atrial fibrillation, coronary artery disease with cardiac stents, obesity, diabetes and hypertension.  She was referred by cardiologist Dr. Alvester Chou and primary care physician Pricilla Holm at the time.  Her sleep study revealed no significant apnea, mild snoring moderate snoring when supine, persistent atrial fibrillation, and severe periodic limb movements with related arousals at 14.6 times per hour of sleep.  I felt strongly that her PLM's are the main culprit also.  Periodic limb movements can be related to medication side effects but my main suspicion was that we need to check for her spinal anatomy, any neuropathy or any iron deficiency.  A ferritin level had to be obtained which was reportedly  very low and the patient was finally treated for iron deficiency and anemia.  In the meantime before the iron deficiency was evident she had seen Dr. Lynann Bologna,  who followed up with her recently  April 2019 and stated that she had no explanation for the progressive balance loss and pain in the extremities and could not correlate this quite to the mild-spinal stenosis as found between L5 and S1.   The patient states that after she was transfused with 2 units her symptoms improved drastically but now are slowly coming back.  She is scheduled for an upper and lower GI endoscopy tomorrow.  This is meant to discover were blood may be lost. There is blood is in her stool. Dr Wallis Mart had seen her 3 years ago and warned about Gi Bleed. Iron deficiency caused severe RLS, and now she can sit still for a while.  On Requip.  UPDATE 10/15/2019CM [name], 67 year old female returns for follow-up with history of restless legs.  Her last ferritin level was 7 and she received an iron transfusion and her symptoms were relieved for about a month and a half.  She is supposed to have another ferritin level done but has not done this as yet. Her upper and lower GI did not find a source of bleeding according  to the patient just irritation.  She remains on Requip extended release 2 mg at bedtime.  She also has intermittent release to take as well.  She returns for reevaluation  REVIEW OF SYSTEMS: Full 14 system review of systems performed and notable only for those listed, all others are neg:  Constitutional: neg  Cardiovascular: neg Ear/Nose/Throat: neg  Skin: neg Eyes: neg Respiratory: neg Gastroitestinal: neg  Hematology/Lymphatic: neg  Endocrine: neg Musculoskeletal:neg Allergy/Immunology: neg Neurological: Weakness Psychiatric: neg Sleep : Restless legs   ALLERGIES: Allergies  Allergen Reactions  . Benadryl [Diphenhydramine Hcl] Other (See Comments)    Restless leg  . Clopidogrel Bisulfate Nausea Only    Nausea & pain  . Contrast Media [Iodinated Diagnostic Agents] Palpitations    Rapid heart rate, hot    HOME MEDICATIONS: Outpatient Medications Prior to Visit  Medication Sig Dispense Refill  . ascorbic acid (VITAMIN C) 1000 MG tablet Take 1,000 mg by mouth 2 (two) times daily.     [name] aspirin 325 MG tablet Take 325  mg by mouth at bedtime.    . budesonide-formoterol (SYMBICORT) 160-4.5 MCG/ACT inhaler USE 2 PUFFS TWICE DAILY (Patient taking differently: Inhale 2 puffs into the lungs 2 (two) times daily as needed (asthma). USE 2 PUFFS TWICE DAILY AS NEEDED) 10.2 g 4  . CALCIUM-MAG-VIT C-VIT D PO Take 1 tablet by mouth daily.     . celecoxib (CELEBREX) 200 MG capsule Take 200 mg by mouth 2 (two) times daily.     . Continuous Blood Gluc Sensor (FREESTYLE LIBRE SENSOR SYSTEM) MISC Use to monitor sugars 1 each 0  . diltiazem (CARDIZEM CD) 180 MG 24 hr capsule TAKE 1 CAPSULE DAILY. 90 capsule 3  . estradiol (ESTRACE) 0.5 MG tablet Take 0.5 mg by mouth daily.     [name] gabapentin (NEURONTIN) 100 MG capsule Take 1 capsule (100 mg total) by mouth 3 (three) times daily. 180 capsule 6  . guaiFENesin (MUCINEX) 600 MG 12 hr tablet Take 600 mg by mouth 2 (two) times daily.    [name]  HYDROcodone-acetaminophen (NORCO/VICODIN) 5-325 MG tablet Take 1 tablet by mouth daily as needed for moderate pain.   0  . INVOKANA 100 MG TABS tablet TAKE 1 TABLET ONCE DAILY. 30 tablet 2  . isosorbide mononitrate (IMDUR) 30 MG 24 hr tablet Take 0.5 tablets (15 mg total) by mouth daily. 45 tablet 2  . latanoprost (XALATAN) 0.005 % ophthalmic solution Place 1 drop into both eyes at bedtime.    . levalbuterol (XOPENEX) 0.63 MG/3ML nebulizer solution Take 3 mLs (0.63 mg total) by nebulization every 6 (six) hours as needed for wheezing or shortness of breath. 75 mL 1  . LORazepam (ATIVAN) 0.5 MG tablet TAKE 1 OR 2 TABLETS AT BEDTIME. 60 tablet 5  . Melatonin 5 MG TABS Take 1 tablet by mouth at bedtime.     . metoprolol succinate (TOPROL-XL) 50 MG 24 hr tablet TAKE 3 TABLETS ONCE DAILY WITH FOOD. 90 tablet 0  . Multiple Vitamins-Iron (MULTIVITAMINS WITH IRON) TABS Take 1 tablet by mouth 2 (two) times daily.     [name] nystatin ointment (MYCOSTATIN) Apply 1 application topically 2 (two) times daily as needed (IRRITATION).   0  . omega-3 acid ethyl esters (LOVAZA) 1 G capsule Take 1 g by mouth daily.     [name] omeprazole (PRILOSEC) 20 MG capsule Take 2 capsules (40 mg total) by mouth daily. 60 capsule 0  . ONETOUCH DELICA LANCETS FINE MISC 1 Units by Does not apply route as directed. 100 each 3  . PARoxetine (PAXIL) 10 MG tablet Take 2.5 tablets (25 mg total) by mouth daily. 225 tablet 11  . quinapril (ACCUPRIL) 40 MG tablet TAKE 1 TABLET ONCE DAILY. 90 tablet 1  . rOPINIRole (REQUIP XL) 2 MG 24 hr tablet Take 1 tablet (2 mg total) by mouth at bedtime. 30 tablet 5  . rOPINIRole (REQUIP) 0.5 MG tablet Take 1 tab at midnight. 60 tablet 6  . rosuvastatin (CRESTOR) 10 MG tablet TAKE ONE TABLET AT BEDTIME. 90 tablet 1  . senna (SENOKOT) 8.6 MG TABS Take 1 tablet by mouth daily.     . sitaGLIPtin-metformin (JANUMET) 50-1000 MG tablet TAKE 1/2 TABLET IN THE MORNING WITH BREAKFAST AND 1 TABLET IN THE EVENING WITH  SUPPER. (Patient taking differently: Take 0.5-1 tablets by mouth See admin instructions. TAKE 1/2 TABLET IN THE MORNING WITH BREAKFAST AND 1 TABLET IN THE EVENING WITH SUPPER.) 140 tablet 6  . XARELTO 20 MG TABS tablet TAKE 1 TABLET ONCE DAILY WITH SUPPER.  30 tablet 5  . XOPENEX HFA 45 MCG/ACT inhaler USE 1 TO 2 PUFFS EVERY 6 HOURS AS NEEDED FOR WHEEZING. 15 g 0  . predniSONE (DELTASONE) 20 MG tablet Take 2 tablets (40 mg total) by mouth daily with breakfast. 10 tablet 0   No facility-administered medications prior to visit.     PAST MEDICAL HISTORY: Past Medical History:  Diagnosis Date  . Anemia, unspecified   . Arthritis   . Asthma   . Atrial fibrillation with RVR ([place])    a. on Xarelto  . Bell's palsy    Facial nerve decompression in 2001  . Chronic low back pain   . COPD with asthma ([place])   . Coronary artery disease    Myoview 04/12/11 was entirely normal. ECHO 02/26/08 showed only minor abnormalities. Stenting 05/26/08 of her posterolateral branch to the left circumflex coronary artery. Used a 2.5x52mm Taxus Monorail stent.myoview 2014 was without ischemia  . Diabetes mellitus    Type 2  . Early cataracts, bilateral   . Fatty liver   . GERD (gastroesophageal reflux disease)   . Glaucoma   . Glaucoma   . Goiter   . Heart murmur   . History of nuclear stress test 2012; 2014   lexiscan; normal pattern of perfusion; normal, low risk scan   . Hyperlipidemia   . Hypertension   . Panic disorder   . Pneumonia 2008  . Polycystic ovary disease    Hysterectomy in 1982 for this  . Shortness of breath dyspnea    ECHO 02/26/08 showed only minor abnormalities  . Spinal stenosis     PAST SURGICAL HISTORY: Past Surgical History:  Procedure Laterality Date  . ABDOMINAL HYSTERECTOMY  1982   & BSO; for polycystic ovary disease  . CARDIOVERSION N/A 12/17/2013   Procedure: CARDIOVERSION;  Surgeon: Pixie Casino, MD;  Location: [facility] ENDOSCOPY;  Service: Cardiovascular;  Laterality: N/A;    . CARDIOVERSION N/A 09/30/2015   Procedure: CARDIOVERSION;  Surgeon: Dorothy Spark, MD;  Location: [facility] ENDOSCOPY;  Service: Cardiovascular;  Laterality: N/A;  . CARDIOVERSION N/A 06/22/2016   Procedure: CARDIOVERSION;  Surgeon: Skeet Latch, MD;  Location: [place];  Service: Cardiovascular;  Laterality: N/A;  . COLONOSCOPY     last 2009; Dr Cristina Gong; due 2019  . COLONOSCOPY WITH PROPOFOL N/A 01/23/2018   Procedure: COLONOSCOPY WITH PROPOFOL;  Surgeon: Ronald Lobo, MD;  Location: [place];  Service: Endoscopy;  Laterality: N/A;  . CORONARY ANGIOPLASTY  05/26/2008   Stenting of her posterolateral branch to the left circumflex coronary artery. Used a 2.5x40mm Taxus Monorail stent.  . ESOPHAGOGASTRODUODENOSCOPY (EGD) WITH PROPOFOL N/A 01/23/2018   Procedure: ESOPHAGOGASTRODUODENOSCOPY (EGD) WITH PROPOFOL;  Surgeon: Ronald Lobo, MD;  Location: [place];  Service: Endoscopy;  Laterality: N/A;  . FACIAL NERVE DECOMPRESSION  2001/2002   bells palsy   . LAPAROSCOPIC CHOLECYSTECTOMY  06/15/2011    Dr Dalbert Batman  . TEE WITHOUT CARDIOVERSION N/A 12/17/2013   Procedure: TRANSESOPHAGEAL ECHOCARDIOGRAM (TEE);  Surgeon: Pixie Casino, MD;  Location[facility] ENDOSCOPY;  Service: Cardiovascular;  Laterality: N/A;  trish/ja  . TRANSTHORACIC ECHOCARDIOGRAM  10/04/9796   LV systolic function normal with mild conc LVH; LA mildly dilated; trace MR/TR  . UPPER GI ENDOSCOPY  2009   negative    FAMILY HISTORY: Family History  Problem Relation Age of Onset  . Heart attack Father 23       2nd MI at 62  . Colon cancer Brother 77  . Gout Brother   .  Ulcers Mother   . Emphysema Mother 59  . Colon polyps Sister   . Cancer Sister        Basal cell carcinoma  . Pneumonia Maternal Grandmother   . Hypertension Brother   . Hyperlipidemia Brother   . Cancer Brother        Skin  . Diabetes Neg Hx   . Stroke Neg Hx     SOCIAL HISTORY: Social History   Socioeconomic History  . Marital status:  Married    Spouse name: Not on file  . Number of children: 2  . Years of education: master's  . Highest education level: Not on file  Occupational History  . Occupation: Forensic scientist: [employer]  . Financial resource strain: Not on file  . Food insecurity:    Worry: Not on file    Inability: Not on file  . Transportation needs:    Medical: Not on file    Non-medical: Not on file  Tobacco Use  . Smoking status: Former Smoker    Packs/day: 1.50    Years: 30.00    Pack years: 45.00    Types: Cigarettes    Last attempt to quit: 08/13/1993    Years since quitting: 24.8  . Smokeless tobacco: Never Used  Substance and Sexual Activity  . Alcohol use: No  . Drug use: No  . Sexual activity: Not on file  Lifestyle  . Physical activity:    Days per week: Not on file    Minutes per session: Not on file  . Stress: Not on file  Relationships  . Social connections:    Talks on phone: Not on file    Gets together: Not on file    Attends religious service: Not on file    Active member of club or organization: Not on file    Attends meetings of clubs or organizations: Not on file    Relationship status: Not on file  . Intimate partner violence:    Fear of current or ex partner: Not on file    Emotionally abused: Not on file    Physically abused: Not on file    Forced sexual activity: Not on file  Other Topics Concern  . Not on file  Social History Narrative  . Not on file     PHYSICAL EXAM  Vitals:   05/27/18 1328  BP: (!) 114/53  Pulse: 64  Weight: 189 lb 12.8 oz (86.1 kg)  Height: 5\' 2"  (1.575 m)   Body mass index is 34.71 kg/m.  Generalized: Well developed, obese female in no acute distress  Head: normocephalic and atraumatic,. Oropharynx benign  Neck: Supple, no carotid bruits  Cardiac: Irregular rate rhythm, no murmur  Musculoskeletal: No deformity   Neurological examination   Mentation: Alert oriented to time, place,  history taking. Attention span and concentration appropriate. Recent and remote memory intact.  Follows all commands speech and language fluent.   Cranial nerve II-XII: Pupils were equal round reactive to light extraocular movements were full, visual field were full on confrontational test. Facial sensation and strength were normal. hearing was intact to finger rubbing bilaterally. Uvula tongue midline. head turning and shoulder shrug were normal and symmetric.Tongue protrusion into cheek strength was normal. Motor: normal bulk and tone, full strength in the BUE, BLE, Sensory: normal and symmetric to light touch, Coordination: finger-nose-finger, heel-to-shin bilaterally, no dysmetria Gait and Station: Rising up from seated position without assistance, normal  stance,  moderate stride, good arm swing, smooth turning, able to perform tiptoe, and heel walking without difficulty. Tandem gait is unsteady.  No assistive device  DIAGNOSTIC DATA (LABS, IMAGING, TESTING) - I reviewed patient records, labs, notes, testing and imaging myself where available.  Lab Results  Component Value Date   WBC 7.1 05/09/2018   HGB 11.8 (L) 05/09/2018   HCT 35.4 (L) 05/09/2018   MCV 83.0 05/09/2018   PLT 292.0 05/09/2018      Component Value Date/Time   NA 127 (L) 01/03/2018 2213   K 4.9 01/03/2018 2213   CL 97 (L) 01/03/2018 2213   CO2 22 01/03/2018 2213   GLUCOSE 118 (H) 01/03/2018 2213   BUN 22 (H) 01/03/2018 2213   CREATININE 0.97 01/03/2018 2213   CALCIUM 8.8 (L) 01/03/2018 2213   PROT 7.2 10/28/2017 1430   ALBUMIN 4.2 10/28/2017 1430   AST 19 10/28/2017 1430   ALT 14 10/28/2017 1430   ALKPHOS 51 10/28/2017 1430   BILITOT 0.5 10/28/2017 1430   GFRNONAA 58 (L) 01/03/2018 2213   GFRAA >60 01/03/2018 2213   Lab Results  Component Value Date   CHOL 113 04/23/2017   HDL 45.60 04/23/2017   LDLCALC 45 04/23/2017   LDLDIRECT 143.4 01/23/2008   TRIG 112.0 04/23/2017   CHOLHDL 2 04/23/2017   Lab  Results  Component Value Date   HGBA1C 6.7 (H) 10/28/2017   Lab Results  Component Value Date   TDSKAJGO11 572 01/03/2018   Lab Results  Component Value Date   TSH 2.27 04/23/2017      ASSESSMENT AND PLAN  67 y.o. year old female  has a past medical history of Anemia, unspecified, Arthritis, Asthma, Atrial fibrillation with RVR ([place]), Bell's palsy, Chronic low back pain, COPD with asthma ([place]), Coronary artery disease, Diabetes mellitus, Early cataracts, bilateral, Fatty liver, GERD (gastroesophageal reflux disease), Glaucoma, Glaucoma, Goiter, Heart murmur, History of nuclear stress test (2012; 2014), Hyperlipidemia, Hypertension, Panic disorder, Pneumonia (2008), Polycystic ovary disease, Shortness of breath dyspnea, and Spinal stenosis. here to follow-up for restless leg syndrome.  Patient also has a history of low ferritin level      Plan:  Low ferritin under 50 ng, total iron capacity and free iron have all been abnormal in the past and she has developed anemia.  This is 1 of the most common risk factors to develop restless legs and PLM's.  Continue Requip at current dose extended release Given a list of foods that contain the most iron Repeat ferritin level in the system last documented was 7 Follow-up in 6 months Dennie Bible, [facility], [facility], [facility] [address], [place], [place] [address] [phone]

## 2018-05-27 ENCOUNTER — Other Ambulatory Visit (INDEPENDENT_AMBULATORY_CARE_PROVIDER_SITE_OTHER): Payer: PPO

## 2018-05-27 ENCOUNTER — Ambulatory Visit (INDEPENDENT_AMBULATORY_CARE_PROVIDER_SITE_OTHER): Payer: PPO | Admitting: Nurse Practitioner

## 2018-05-27 ENCOUNTER — Encounter: Payer: Self-pay | Admitting: Nurse Practitioner

## 2018-05-27 VITALS — BP 114/53 | HR 64 | Ht 62.0 in | Wt 189.8 lb

## 2018-05-27 DIAGNOSIS — R790 Abnormal level of blood mineral: Secondary | ICD-10-CM

## 2018-05-27 DIAGNOSIS — G2581 Restless legs syndrome: Secondary | ICD-10-CM | POA: Diagnosis not present

## 2018-05-27 DIAGNOSIS — E611 Iron deficiency: Secondary | ICD-10-CM

## 2018-05-27 LAB — IBC PANEL
Iron: 42 ug/dL (ref 42–145)
Saturation Ratios: 7.4 % — ABNORMAL LOW (ref 20.0–50.0)
Transferrin: 407 mg/dL — ABNORMAL HIGH (ref 212.0–360.0)

## 2018-05-27 LAB — FERRITIN: Ferritin: 26 ng/mL (ref 10.0–291.0)

## 2018-05-27 NOTE — Patient Instructions (Signed)
Continue Requip at current dose extended release Given a list of foods that contain the most iron Repeat ferritin level in the system last documented was 7 Follow-up in 6 months

## 2018-05-28 ENCOUNTER — Encounter: Payer: Self-pay | Admitting: Internal Medicine

## 2018-06-03 ENCOUNTER — Ambulatory Visit: Payer: PPO | Admitting: Internal Medicine

## 2018-06-05 ENCOUNTER — Encounter: Payer: Self-pay | Admitting: Podiatry

## 2018-06-05 ENCOUNTER — Other Ambulatory Visit: Payer: Self-pay | Admitting: Internal Medicine

## 2018-06-05 ENCOUNTER — Ambulatory Visit: Payer: PPO | Admitting: Podiatry

## 2018-06-05 DIAGNOSIS — M778 Other enthesopathies, not elsewhere classified: Secondary | ICD-10-CM

## 2018-06-05 DIAGNOSIS — M779 Enthesopathy, unspecified: Secondary | ICD-10-CM

## 2018-06-05 MED ORDER — TRIAMCINOLONE ACETONIDE 10 MG/ML IJ SUSP
10.0000 mg | Freq: Once | INTRAMUSCULAR | Status: AC
  Administered 2018-06-05: 10 mg

## 2018-06-06 NOTE — Progress Notes (Signed)
Subjective:   Patient ID: [name], female   DOB: 67 y.o.   MRN: [id number]   HPI Patient presents stating that she is developed a flareup in the dorsum of both feet and would like injections today as they are very effective in helping to control her symptoms   ROS      Objective:  Physical Exam  Neurovascular status intact with dorsal tendinitis bilateral that is painful when palpated and making shoe gear difficult     Assessment:  Acute tendinitis dorsum of both feet with pain     Plan:  Sterile prep applied to each foot and injected the dorsal tendon complex 3 mg Kenalog 5 mg Xylocaine which was tolerated well applied sterile dressings and reappoint as needed

## 2018-06-09 ENCOUNTER — Encounter: Payer: Self-pay | Admitting: Internal Medicine

## 2018-06-09 ENCOUNTER — Other Ambulatory Visit (INDEPENDENT_AMBULATORY_CARE_PROVIDER_SITE_OTHER): Payer: PPO

## 2018-06-09 ENCOUNTER — Ambulatory Visit (INDEPENDENT_AMBULATORY_CARE_PROVIDER_SITE_OTHER): Payer: PPO | Admitting: Internal Medicine

## 2018-06-09 VITALS — BP 138/70 | HR 77 | Temp 97.8°F | Ht 62.0 in | Wt 187.0 lb

## 2018-06-09 DIAGNOSIS — I10 Essential (primary) hypertension: Secondary | ICD-10-CM

## 2018-06-09 DIAGNOSIS — M1990 Unspecified osteoarthritis, unspecified site: Secondary | ICD-10-CM | POA: Diagnosis not present

## 2018-06-09 DIAGNOSIS — E785 Hyperlipidemia, unspecified: Secondary | ICD-10-CM | POA: Diagnosis not present

## 2018-06-09 DIAGNOSIS — E1159 Type 2 diabetes mellitus with other circulatory complications: Secondary | ICD-10-CM

## 2018-06-09 DIAGNOSIS — F32A Depression, unspecified: Secondary | ICD-10-CM

## 2018-06-09 DIAGNOSIS — F5101 Primary insomnia: Secondary | ICD-10-CM

## 2018-06-09 DIAGNOSIS — Z0000 Encounter for general adult medical examination without abnormal findings: Secondary | ICD-10-CM

## 2018-06-09 DIAGNOSIS — E1169 Type 2 diabetes mellitus with other specified complication: Secondary | ICD-10-CM | POA: Diagnosis not present

## 2018-06-09 DIAGNOSIS — F329 Major depressive disorder, single episode, unspecified: Secondary | ICD-10-CM

## 2018-06-09 DIAGNOSIS — D500 Iron deficiency anemia secondary to blood loss (chronic): Secondary | ICD-10-CM | POA: Diagnosis not present

## 2018-06-09 DIAGNOSIS — Z23 Encounter for immunization: Secondary | ICD-10-CM | POA: Diagnosis not present

## 2018-06-09 LAB — LIPID PANEL
Cholesterol: 132 mg/dL (ref 0–200)
HDL: 57.1 mg/dL (ref >39.00)
LDL Cholesterol: 59 mg/dL (ref 0–99)
NonHDL: 75.36
Total CHOL/HDL Ratio: 2
Triglycerides: 80 mg/dL (ref 0.0–149.0)
VLDL: 16 mg/dL (ref 0.0–40.0)

## 2018-06-09 LAB — COMPREHENSIVE METABOLIC PANEL
ALT: 17 U/L (ref 0–35)
AST: 20 U/L (ref 0–37)
Albumin: 4.5 g/dL (ref 3.5–5.2)
Alkaline Phosphatase: 61 U/L (ref 39–117)
BUN: 22 mg/dL (ref 6–23)
CO2: 27 mEq/L (ref 19–32)
Calcium: 9.6 mg/dL (ref 8.4–10.5)
Chloride: 97 mEq/L (ref 96–112)
Creatinine, Ser: 0.72 mg/dL (ref 0.40–1.20)
GFR: 85.2 mL/min (ref >60.00)
Glucose, Bld: 166 mg/dL — ABNORMAL HIGH (ref 70–99)
Potassium: 4.4 mEq/L (ref 3.5–5.1)
Sodium: 131 mEq/L — ABNORMAL LOW (ref 135–145)
Total Bilirubin: 0.5 mg/dL (ref 0.2–1.2)
Total Protein: 7.7 g/dL (ref 6.0–8.3)

## 2018-06-09 LAB — CBC
HCT: 37.9 % (ref 36.0–46.0)
Hemoglobin: 12.7 g/dL (ref 12.0–15.0)
MCHC: 33.4 g/dL (ref 30.0–36.0)
MCV: 84.9 fl (ref 78.0–100.0)
Platelets: 315 10*3/uL (ref 150.0–400.0)
RBC: 4.47 Mil/uL (ref 3.87–5.11)
RDW: 13.9 % (ref 11.5–15.5)
WBC: 10.9 10*3/uL — ABNORMAL HIGH (ref 4.0–10.5)

## 2018-06-09 LAB — FERRITIN: Ferritin: 16.7 ng/mL (ref 10.0–291.0)

## 2018-06-09 LAB — MICROALBUMIN / CREATININE URINE RATIO
Creatinine,U: 26.1 mg/dL
Microalb Creat Ratio: 60.8 mg/g — ABNORMAL HIGH (ref 0.0–30.0)
Microalb, Ur: 15.8 mg/dL — ABNORMAL HIGH (ref 0.0–1.9)

## 2018-06-09 LAB — IBC PANEL
Iron: 51 ug/dL (ref 42–145)
Saturation Ratios: 8.3 % — ABNORMAL LOW (ref 20.0–50.0)
Transferrin: 441 mg/dL — ABNORMAL HIGH (ref 212.0–360.0)

## 2018-06-09 LAB — HEMOGLOBIN A1C: Hgb A1c MFr Bld: 5.8 % (ref 4.6–6.5)

## 2018-06-09 NOTE — Assessment & Plan Note (Signed)
Checking CBC, iron panel. May need another iron infusion in the near future due to declining energy and function similar to prior iron infusion needed.

## 2018-06-09 NOTE — Assessment & Plan Note (Signed)
Taking celebrex and this has caused some blood loss but her arthritis pain is very severe and she elects to continue with change in PPI to help avoid blood loss.

## 2018-06-09 NOTE — Assessment & Plan Note (Signed)
Lorazepam 1-2 pills at night time. She is aware of risk of dependence, addiction, memory change and increased risk of falls. She elects to continue given increase in QOL.

## 2018-06-09 NOTE — Patient Instructions (Addendum)
[name] is due for prevnar 13 (pneumonia booster shot) and high dose flu shot.   Health Maintenance, Female Adopting a healthy lifestyle and getting preventive care can go a long way to promote health and wellness. Talk with your health care provider about what schedule of regular examinations is right for you. This is a good chance for you to check in with your provider about disease prevention and staying healthy. In between checkups, there are plenty of things you can do on your own. Experts have done a lot of research about which lifestyle changes and preventive measures are most likely to keep you healthy. Ask your health care provider for more information. Weight and diet Eat a healthy diet  Be sure to include plenty of vegetables, fruits, low-fat dairy products, and lean protein.  Do not eat a lot of foods high in solid fats, added sugars, or salt.  Get regular exercise. This is one of the most important things you can do for your health. ? Most adults should exercise for at least 150 minutes each week. The exercise should increase your heart rate and make you sweat (moderate-intensity exercise). ? Most adults should also do strengthening exercises at least twice a week. This is in addition to the moderate-intensity exercise.  Maintain a healthy weight  Body mass index (BMI) is a measurement that can be used to identify possible weight problems. It estimates body fat based on height and weight. Your health care provider can help determine your BMI and help you achieve or maintain a healthy weight.  For females 58 years of age and older: ? A BMI below 18.5 is considered underweight. ? A BMI of 18.5 to 24.9 is normal. ? A BMI of 25 to 29.9 is considered overweight. ? A BMI of 30 and above is considered obese.  Watch levels of cholesterol and blood lipids  You should start having your blood tested for lipids and cholesterol at 69 years of age, then have this test every 5 years.  You  may need to have your cholesterol levels checked more often if: ? Your lipid or cholesterol levels are high. ? You are older than 69 years of age. ? You are at high risk for heart disease.  Cancer screening Lung Cancer  Lung cancer screening is recommended for adults 65-44 years old who are at high risk for lung cancer because of a history of smoking.  A yearly low-dose CT scan of the lungs is recommended for people who: ? Currently smoke. ? Have quit within the past 15 years. ? Have at least a 30-pack-year history of smoking. A pack year is smoking an average of one pack of cigarettes a day for 1 year.  Yearly screening should continue until it has been 15 years since you quit.  Yearly screening should stop if you develop a health problem that would prevent you from having lung cancer treatment.  Breast Cancer  Practice breast self-awareness. This means understanding how your breasts normally appear and feel.  It also means doing regular breast self-exams. Let your health care provider know about any changes, no matter how small.  If you are in your 20s or 30s, you should have a clinical breast exam (CBE) by a health care provider every 1-3 years as part of a regular health exam.  If you are 95 or older, have a CBE every year. Also consider having a breast X-ray (mammogram) every year.  If you have a family history of breast cancer,  talk to your health care provider about genetic screening.  If you are at high risk for breast cancer, talk to your health care provider about having an MRI and a mammogram every year.  Breast cancer gene (BRCA) assessment is recommended for women who have family members with BRCA-related cancers. BRCA-related cancers include: ? Breast. ? Ovarian. ? Tubal. ? Peritoneal cancers.  Results of the assessment will determine the need for genetic counseling and BRCA1 and BRCA2 testing.  Cervical Cancer Your health care provider may recommend that you  be screened regularly for cancer of the pelvic organs (ovaries, uterus, and vagina). This screening involves a pelvic examination, including checking for microscopic changes to the surface of your cervix (Pap test). You may be encouraged to have this screening done every 3 years, beginning at age 42.  For women ages 28-65, health care providers may recommend pelvic exams and Pap testing every 3 years, or they may recommend the Pap and pelvic exam, combined with testing for human papilloma virus (HPV), every 5 years. Some types of HPV increase your risk of cervical cancer. Testing for HPV may also be done on women of any age with unclear Pap test results.  Other health care providers may not recommend any screening for nonpregnant women who are considered low risk for pelvic cancer and who do not have symptoms. Ask your health care provider if a screening pelvic exam is right for you.  If you have had past treatment for cervical cancer or a condition that could lead to cancer, you need Pap tests and screening for cancer for at least 20 years after your treatment. If Pap tests have been discontinued, your risk factors (such as having a new sexual partner) need to be reassessed to determine if screening should resume. Some women have medical problems that increase the chance of getting cervical cancer. In these cases, your health care provider may recommend more frequent screening and Pap tests.  Colorectal Cancer  This type of cancer can be detected and often prevented.  Routine colorectal cancer screening usually begins at 69 years of age and continues through 69 years of age.  Your health care provider may recommend screening at an earlier age if you have risk factors for colon cancer.  Your health care provider may also recommend using home test kits to check for hidden blood in the stool.  A small camera at the end of a tube can be used to examine your colon directly (sigmoidoscopy or  colonoscopy). This is done to check for the earliest forms of colorectal cancer.  Routine screening usually begins at age 56.  Direct examination of the colon should be repeated every 5-10 years through 69 years of age. However, you may need to be screened more often if early forms of precancerous polyps or small growths are found.  Skin Cancer  Check your skin from head to toe regularly.  Tell your health care provider about any new moles or changes in moles, especially if there is a change in a mole's shape or color.  Also tell your health care provider if you have a mole that is larger than the size of a pencil eraser.  Always use sunscreen. Apply sunscreen liberally and repeatedly throughout the day.  Protect yourself by wearing long sleeves, pants, a wide-brimmed hat, and sunglasses whenever you are outside.  Heart disease, diabetes, and high blood pressure  High blood pressure causes heart disease and increases the risk of stroke. High blood pressure is  more likely to develop in: ? People who have blood pressure in the high end of the normal range (130-139/85-89 mm Hg). ? People who are overweight or obese. ? People who are African American.  If you are 59-72 years of age, have your blood pressure checked every 3-5 years. If you are 93 years of age or older, have your blood pressure checked every year. You should have your blood pressure measured twice-once when you are at a hospital or clinic, and once when you are not at a hospital or clinic. Record the average of the two measurements. To check your blood pressure when you are not at a hospital or clinic, you can use: ? An automated blood pressure machine at a pharmacy. ? A home blood pressure monitor.  If you are between 28 years and 27 years old, ask your health care provider if you should take aspirin to prevent strokes.  Have regular diabetes screenings. This involves taking a blood sample to check your fasting blood sugar  level. ? If you are at a normal weight and have a low risk for diabetes, have this test once every three years after 69 years of age. ? If you are overweight and have a high risk for diabetes, consider being tested at a younger age or more often. Preventing infection Hepatitis B  If you have a higher risk for hepatitis B, you should be screened for this virus. You are considered at high risk for hepatitis B if: ? You were born in a country where hepatitis B is common. Ask your health care provider which countries are considered high risk. ? Your parents were born in a high-risk country, and you have not been immunized against hepatitis B (hepatitis B vaccine). ? You have HIV or AIDS. ? You use needles to inject street drugs. ? You live with someone who has hepatitis B. ? You have had sex with someone who has hepatitis B. ? You get hemodialysis treatment. ? You take certain medicines for conditions, including cancer, organ transplantation, and autoimmune conditions.  Hepatitis C  Blood testing is recommended for: ? Everyone born from [birth year] through 1967. ? Anyone with known risk factors for hepatitis C.  Sexually transmitted infections (STIs)  You should be screened for sexually transmitted infections (STIs) including gonorrhea and chlamydia if: ? You are sexually active and are younger than 69 years of age. ? You are older than 69 years of age and your health care provider tells you that you are at risk for this type of infection. ? Your sexual activity has changed since you were last screened and you are at an increased risk for chlamydia or gonorrhea. Ask your health care provider if you are at risk.  If you do not have HIV, but are at risk, it may be recommended that you take a prescription medicine daily to prevent HIV infection. This is called pre-exposure prophylaxis (PrEP). You are considered at risk if: ? You are sexually active and do not regularly use condoms or know the HIV  status of your partner(s). ? You take drugs by injection. ? You are sexually active with a partner who has HIV.  Talk with your health care provider about whether you are at high risk of being infected with HIV. If you choose to begin PrEP, you should first be tested for HIV. You should then be tested every 3 months for as long as you are taking PrEP. Pregnancy  If you are premenopausal and you  may become pregnant, ask your health care provider about preconception counseling.  If you may become pregnant, take 400 to 800 micrograms (mcg) of folic acid every day.  If you want to prevent pregnancy, talk to your health care provider about birth control (contraception). Osteoporosis and menopause  Osteoporosis is a disease in which the bones lose minerals and strength with aging. This can result in serious bone fractures. Your risk for osteoporosis can be identified using a bone density scan.  If you are 54 years of age or older, or if you are at risk for osteoporosis and fractures, ask your health care provider if you should be screened.  Ask your health care provider whether you should take a calcium or vitamin D supplement to lower your risk for osteoporosis.  Menopause may have certain physical symptoms and risks.  Hormone replacement therapy may reduce some of these symptoms and risks. Talk to your health care provider about whether hormone replacement therapy is right for you. Follow these instructions at home:  Schedule regular health, dental, and eye exams.  Stay current with your immunizations.  Do not use any tobacco products including cigarettes, chewing tobacco, or electronic cigarettes.  If you are pregnant, do not drink alcohol.  If you are breastfeeding, limit how much and how often you drink alcohol.  Limit alcohol intake to no more than 1 drink per day for nonpregnant women. One drink equals 12 ounces of beer, 5 ounces of wine, or 1 ounces of hard liquor.  Do not  use street drugs.  Do not share needles.  Ask your health care provider for help if you need support or information about quitting drugs.  Tell your health care provider if you often feel depressed.  Tell your health care provider if you have ever been abused or do not feel safe at home. This information is not intended to replace advice given to you by your health care provider. Make sure you discuss any questions you have with your health care provider. Document Released: 02/12/2011 Document Revised: 01/05/2016 Document Reviewed: 05/03/2015 Elsevier Interactive Patient Education  [employer].

## 2018-06-09 NOTE — Progress Notes (Signed)
   Subjective:    Patient ID: [name], female    DOB: Apr 17, 1951, 67 y.o.   MRN: [id number]  HPI Here for medicare wellness and physical, no new complaints. Please see A/P for status and treatment of chronic medical problems.   Iron transfusion mid November if needed  Diet: DM since diabetic Physical activity: sedentary Depression/mood screen: negative Hearing: intact to whispered voice Visual acuity: grossly normal, readers, performs annual eye exam  ADLs: capable Fall risk: none Home safety: good Cognitive evaluation: intact to orientation, naming, recall and repetition EOL planning: adv directives discussed  I have personally reviewed and have noted 1. The patient's medical and social history - reviewed today no changes 2. Their use of alcohol, tobacco or illicit drugs 3. Their current medications and supplements 4. The patient's functional ability including ADL's, fall risks, home safety risks and hearing or visual impairment. 5. Diet and physical activities 6. Evidence for depression or mood disorders 7. Care team reviewed and updated (available in snapshot)  Review of Systems  Constitutional: Positive for fatigue. Negative for activity change, appetite change, fever and unexpected weight change.  HENT: Negative.   Eyes: Negative.   Respiratory: Positive for shortness of breath. Negative for cough and chest tightness.   Cardiovascular: Negative for chest pain, palpitations and leg swelling.  Gastrointestinal: Negative for abdominal distention, abdominal pain, constipation, diarrhea, nausea and vomiting.  Musculoskeletal: Negative.   Skin: Negative.   Neurological: Negative.   Psychiatric/Behavioral: Negative.       Objective:   Physical Exam  Constitutional: She is oriented to person, place, and time. She appears well-developed and well-nourished.  Overweight  HENT:  Head: Normocephalic and atraumatic.  Eyes: EOM are normal.  Neck: Normal range of motion.    Cardiovascular: Normal rate and regular rhythm.  No bruit carotids bilateral  Pulmonary/Chest: Effort normal and breath sounds normal. No respiratory distress. She has no wheezes. She has no rales.  Abdominal: Soft. Bowel sounds are normal. She exhibits no distension. There is no tenderness. There is no rebound.  Musculoskeletal: She exhibits no edema.  Neurological: She is alert and oriented to person, place, and time. Coordination normal.  Skin: Skin is warm and dry.  Psychiatric: She has a normal mood and affect.   Vitals:   06/09/18 1421  BP: 138/70  Pulse: 77  Temp: 97.8 F (36.6 C)  TempSrc: Oral  SpO2: 99%  Weight: 187 lb (84.8 kg)  Height: 5\' 2"  (1.575 m)      Assessment & Plan:  Flu and prevnar 13 given at visit

## 2018-06-09 NOTE — Assessment & Plan Note (Signed)
Checking lipid panel and adjust as needed. Taking crestor 10 mg daily.

## 2018-06-09 NOTE — Assessment & Plan Note (Signed)
Flu shot given. Pneumonia prevnar 13 given. Shingrix declines. Tetanus declines today. Colonoscopy up to date. Mammogram up to date, pap smear aged out and dexa up to date. Counseled about sun safety and mole surveillance. Counseled about the dangers of distracted driving. Given 10 year screening recommendations.

## 2018-06-09 NOTE — Assessment & Plan Note (Signed)
Taking paxil and lorazepam for sleep. 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 Continue with current treatment as it is effective. No psychotic features.

## 2018-06-09 NOTE — Assessment & Plan Note (Signed)
Checking CMP and adjust quinapril and metoprolol and diltiazem. BP at goal.

## 2018-06-09 NOTE — Assessment & Plan Note (Signed)
Checking HgA1c and lipid panel and foot exam done. Due for eye exam (scheduled Dec). Taking invokana and janumet and previously good control. Adjust as needed.

## 2018-06-10 ENCOUNTER — Encounter: Payer: Self-pay | Admitting: Cardiology

## 2018-06-10 ENCOUNTER — Ambulatory Visit: Payer: PPO | Admitting: Cardiology

## 2018-06-10 VITALS — BP 126/68 | HR 98 | Ht 62.0 in | Wt 183.0 lb

## 2018-06-10 DIAGNOSIS — I4821 Permanent atrial fibrillation: Secondary | ICD-10-CM | POA: Diagnosis not present

## 2018-06-10 DIAGNOSIS — Z9861 Coronary angioplasty status: Secondary | ICD-10-CM

## 2018-06-10 DIAGNOSIS — I2510 Atherosclerotic heart disease of native coronary artery without angina pectoris: Secondary | ICD-10-CM | POA: Diagnosis not present

## 2018-06-10 DIAGNOSIS — I10 Essential (primary) hypertension: Secondary | ICD-10-CM

## 2018-06-10 DIAGNOSIS — E669 Obesity, unspecified: Secondary | ICD-10-CM

## 2018-06-10 MED ORDER — ASPIRIN EC 81 MG PO TBEC: 81.0000 mg | DELAYED_RELEASE_TABLET | Freq: Every day | ORAL | 3 refills | Status: DC

## 2018-06-10 NOTE — Assessment & Plan Note (Signed)
BMI 33, sleep study negative Jan 2019

## 2018-06-10 NOTE — Progress Notes (Signed)
ek 

## 2018-06-10 NOTE — Progress Notes (Signed)
06/10/2018 [name]   [birth year]  [id number]  Primary Physician Hoyt Koch, MD Primary Cardiologist: Dr Gwenlyn Found  HPI:  4-year- female, who is formerly a patient of Dr. Ky Barban now followed by Dr Gwenlyn Found. She works full time as a Loss adjuster, [employer] and exercise psychologist. She has a history of CAD- Dr. Melvern Banker stented her distal circumflex posterolateral branch, May 26, 2008, with some residual disease in the PDA branch, and normal LV function. Because of her recurrent chest pain cardiac catheterization was done on April 20, 2011, revealing a patent stent, 50% distal PDA lesion, with normal LV function. Her last Myoview was Aug 2016 and was low risk.  Other problems include obesity, negative sleep study Jan 2019, DM with neuropathy, HTN, HLD,depression, and atrial fibrillation. She has been followed at the AF clinic- see complete notes by Roderic Palau. She is now in permanent atrial fibrillation. She is in the office today because she became concerned after she noted a couple of home B/P readings were elevated. She says she can tell when her B/P is up- she "just feels bad". She takes an extra 15 mg of Imdur with relief. She only had 2-3 B/P readings over 150. Today he B/P is 128/68.    Current Outpatient Medications  Medication Sig Dispense Refill  . ascorbic acid (VITAMIN C) 1000 MG tablet Take 1,000 mg by mouth 2 (two) times daily.     . budesonide-formoterol (SYMBICORT) 160-4.5 MCG/ACT inhaler USE 2 PUFFS TWICE DAILY (Patient taking differently: Inhale 2 puffs into the lungs 2 (two) times daily as needed (asthma). USE 2 PUFFS TWICE DAILY AS NEEDED) 10.2 g 4  . CALCIUM-MAG-VIT C-VIT D PO Take 1 tablet by mouth daily.     . celecoxib (CELEBREX) 200 MG capsule Take 200 mg by mouth 2 (two) times daily.     . Continuous Blood Gluc Sensor (FREESTYLE LIBRE SENSOR SYSTEM) MISC Use to monitor sugars 1 each 0  . diltiazem (CARDIZEM CD) 180 MG 24 hr capsule TAKE 1 CAPSULE DAILY. 90  capsule 3  . gabapentin (NEURONTIN) 100 MG capsule Take 1 capsule (100 mg total) by mouth 3 (three) times daily. 180 capsule 6  . guaiFENesin (MUCINEX) 600 MG 12 hr tablet Take 600 mg by mouth 2 (two) times daily.    [name] HYDROcodone-acetaminophen (NORCO/VICODIN) 5-325 MG tablet Take 1 tablet by mouth daily as needed for moderate pain.   0  . INVOKANA 100 MG TABS tablet TAKE 1 TABLET ONCE DAILY. 30 tablet 2  . isosorbide mononitrate (IMDUR) 30 MG 24 hr tablet Take 0.5 tablets (15 mg total) by mouth daily. 45 tablet 2  . latanoprost (XALATAN) 0.005 % ophthalmic solution Place 1 drop into both eyes at bedtime.    . levalbuterol (XOPENEX) 0.63 MG/3ML nebulizer solution Take 3 mLs (0.63 mg total) by nebulization every 6 (six) hours as needed for wheezing or shortness of breath. 75 mL 1  . LORazepam (ATIVAN) 0.5 MG tablet TAKE 1 OR 2 TABLETS AT BEDTIME. 60 tablet 5  . Melatonin 5 MG TABS Take 1 tablet by mouth at bedtime.     . metoprolol succinate (TOPROL-XL) 50 MG 24 hr tablet TAKE 3 TABLETS ONCE DAILY WITH FOOD. 90 tablet 0  . Multiple Vitamins-Iron (MULTIVITAMINS WITH IRON) TABS Take 1 tablet by mouth 2 (two) times daily.     [name] nystatin ointment (MYCOSTATIN) Apply 1 application topically 2 (two) times daily as needed (IRRITATION).   0  . omega-3 acid ethyl esters (LOVAZA)  1 G capsule Take 1 g by mouth daily.     [name] omeprazole (PRILOSEC) 20 MG capsule Take 2 capsules (40 mg total) by mouth daily. 60 capsule 0  . ONETOUCH DELICA LANCETS FINE MISC 1 Units by Does not apply route as directed. 100 each 3  . PARoxetine (PAXIL) 10 MG tablet Take 2.5 tablets (25 mg total) by mouth daily. 225 tablet 11  . quinapril (ACCUPRIL) 40 MG tablet TAKE 1 TABLET ONCE DAILY. 90 tablet 0  . rOPINIRole (REQUIP XL) 2 MG 24 hr tablet Take 1 tablet (2 mg total) by mouth at bedtime. 30 tablet 5  . rOPINIRole (REQUIP) 0.5 MG tablet Take 1 tab at midnight. 60 tablet 6  . rosuvastatin (CRESTOR) 10 MG tablet TAKE ONE TABLET AT  BEDTIME. 90 tablet 1  . senna (SENOKOT) 8.6 MG TABS Take 1 tablet by mouth daily.     . sitaGLIPtin-metformin (JANUMET) 50-1000 MG tablet TAKE 1/2 TABLET IN THE MORNING WITH BREAKFAST AND 1 TABLET IN THE EVENING WITH SUPPER. (Patient taking differently: Take 0.5-1 tablets by mouth See admin instructions. TAKE 1/2 TABLET IN THE MORNING WITH BREAKFAST AND 1 TABLET IN THE EVENING WITH SUPPER.) 140 tablet 6  . XARELTO 20 MG TABS tablet TAKE 1 TABLET ONCE DAILY WITH SUPPER. 30 tablet 5  . XOPENEX HFA 45 MCG/ACT inhaler USE 1 TO 2 PUFFS EVERY 6 HOURS AS NEEDED FOR WHEEZING. 15 g 0  . aspirin EC 81 MG tablet Take 1 tablet (81 mg total) by mouth daily. 90 tablet 3   No current facility-administered medications for this visit.     Allergies  Allergen Reactions  . Benadryl [Diphenhydramine Hcl] Other (See Comments)    Restless leg  . Clopidogrel Bisulfate Nausea Only    Nausea & pain  . Contrast Media [Iodinated Diagnostic Agents] Palpitations    Rapid heart rate, hot    Past Medical History:  Diagnosis Date  . Anemia, unspecified   . Arthritis   . Asthma   . Atrial fibrillation with RVR ([place])    a. on Xarelto  . Bell's palsy    Facial nerve decompression in 2001  . Chronic low back pain   . COPD with asthma ([place])   . Coronary artery disease    Myoview 04/12/11 was entirely normal. ECHO 02/26/08 showed only minor abnormalities. Stenting 05/26/08 of her posterolateral branch to the left circumflex coronary artery. Used a 2.5x24mm Taxus Monorail stent.myoview 2014 was without ischemia  . Diabetes mellitus    Type 2  . Early cataracts, bilateral   . Fatty liver   . GERD (gastroesophageal reflux disease)   . Glaucoma   . Glaucoma   . Goiter   . Heart murmur   . History of nuclear stress test 2012; 2014   lexiscan; normal pattern of perfusion; normal, low risk scan   . Hyperlipidemia   . Hypertension   . Panic disorder   . Pneumonia 2008  . Polycystic ovary disease    Hysterectomy in  1982 for this  . Shortness of breath dyspnea    ECHO 02/26/08 showed only minor abnormalities  . Spinal stenosis     Social History   Socioeconomic History  . Marital status: Married    Spouse name: Not on file  . Number of children: 2  . Years of education: master's  . Highest education level: Not on file  Occupational History  . Occupation: Forensic scientist: [employer]  Needs  . Financial resource strain: Not on file  . Food insecurity:    Worry: Not on file    Inability: Not on file  . Transportation needs:    Medical: Not on file    Non-medical: Not on file  Tobacco Use  . Smoking status: Former Smoker    Packs/day: 1.50    Years: 30.00    Pack years: 45.00    Types: Cigarettes    Last attempt to quit: 08/13/1993    Years since quitting: 24.8  . Smokeless tobacco: Never Used  Substance and Sexual Activity  . Alcohol use: No  . Drug use: No  . Sexual activity: Not on file  Lifestyle  . Physical activity:    Days per week: Not on file    Minutes per session: Not on file  . Stress: Not on file  Relationships  . Social connections:    Talks on phone: Not on file    Gets together: Not on file    Attends religious service: Not on file    Active member of club or organization: Not on file    Attends meetings of clubs or organizations: Not on file    Relationship status: Not on file  . Intimate partner violence:    Fear of current or ex partner: Not on file    Emotionally abused: Not on file    Physically abused: Not on file    Forced sexual activity: Not on file  Other Topics Concern  . Not on file  Social History Narrative  . Not on file     Family History  Problem Relation Age of Onset  . Heart attack Father 49       2nd MI at 60  . Colon cancer Brother 10  . Gout Brother   . Ulcers Mother   . Emphysema Mother 84  . Colon polyps Sister   . Cancer Sister        Basal cell carcinoma  . Pneumonia Maternal Grandmother     . Hypertension Brother   . Hyperlipidemia Brother   . Cancer Brother        Skin  . Diabetes Neg Hx   . Stroke Neg Hx      Review of Systems: General: negative for chills, fever, night sweats or weight changes.  Cardiovascular: negative for chest pain, dyspnea on exertion, edema, orthopnea, palpitations, paroxysmal nocturnal dyspnea or shortness of breath Dermatological: negative for rash Respiratory: negative for cough or wheezing Urologic: negative for hematuria Abdominal: negative for nausea, vomiting, diarrhea, bright red blood per rectum, melena, or hematemesis She has chronic anemia and has had evidence of PUD on endoscopy Neurologic: negative for visual changes, syncope, or dizziness All other systems reviewed and are otherwise negative except as noted above.    Blood pressure 126/68, pulse 98, height 5\' 2"  (1.575 m), weight 183 lb (83 kg), SpO2 98 %.  General appearance: alert, cooperative, no distress and moderately obese Lungs: clear to auscultation bilaterally Heart: regular rate and rhythm Skin: warm and dry Neurologic: Grossly normal  EKG AF with VR 96  ASSESSMENT AND PLAN:   Essential hypertension She was concerned about a couple of high readings -150s- she had at home.  Permanent atrial fibrillation Rate control-doing well  Obesity (BMI 30.0-34.9) BMI 33, sleep study negative Jan 2019  CAD S/P percutaneous coronary angioplasty S/P CFX PCI in '02, cath 2012 showed patent site, Myoview was low risk Aug 2016   PLAN  Decrease  ASA to 81 mg daily. I reassured her her B/P overall looked good and that occasional elevations were not cause for concern. I suggested she contact us if her systolic B/P is sustained > 150. F/U with Dr Gwenlyn Found in 6 months.   Kerin Ransom PA-C 06/10/2018 3:12 PM

## 2018-06-10 NOTE — Patient Instructions (Signed)
Medication Instructions:  DECREASE Aspirin to 81mg  Take 1 tablet once a day   If you need a refill on your cardiac medications before your next appointment, please call your pharmacy.   Lab work: None  If you have labs (blood work) drawn today and your tests are completely normal, you will receive your results only by: [name] MyChart Message (if you have MyChart) OR . A paper copy in the mail If you have any lab test that is abnormal or we need to change your treatment, we will call you to review the results.  Testing/Procedures: None   Follow-Up: At [facility], you and your health needs are our priority.  As part of our continuing mission to provide you with exceptional heart care, we have created designated Provider Care Teams.  These Care Teams include your primary Cardiologist (physician) and Advanced Practice Providers (APPs -  Physician Assistants and Nurse Practitioners) who all work together to provide you with the care you need, when you need it. You will need a follow up appointment in 6 months.  Please call our office 2 months in advance to schedule this appointment.  You may see Quay Burow, MD or one of the following Advanced Practice Providers on your designated Care Team:   Kerin Ransom, PA-C Roby Lofts, [place] . Sande Rives, PA-C  Any Other Special Instructions Will Be Listed Below (If Applicable).

## 2018-06-10 NOTE — Assessment & Plan Note (Signed)
S/P CFX PCI in '02, cath 2012 showed patent site, Myoview was low risk Aug 2016

## 2018-06-10 NOTE — Assessment & Plan Note (Signed)
She was concerned about a couple of high readings -150s- she had at home.

## 2018-06-10 NOTE — Assessment & Plan Note (Signed)
Rate control-doing well

## 2018-06-11 ENCOUNTER — Other Ambulatory Visit: Payer: Self-pay | Admitting: Cardiovascular Disease

## 2018-06-11 NOTE — Telephone Encounter (Signed)
Rx request sent to pharmacy.  

## 2018-06-12 ENCOUNTER — Encounter: Payer: Self-pay | Admitting: Internal Medicine

## 2018-06-12 NOTE — Progress Notes (Unsigned)
Abstracted and sent to scan  

## 2018-06-13 ENCOUNTER — Ambulatory Visit: Payer: PPO | Admitting: Cardiology

## 2018-06-27 ENCOUNTER — Other Ambulatory Visit: Payer: Self-pay | Admitting: Internal Medicine

## 2018-06-27 ENCOUNTER — Other Ambulatory Visit: Payer: Self-pay | Admitting: Cardiovascular Disease

## 2018-06-30 ENCOUNTER — Encounter: Payer: Self-pay | Admitting: Internal Medicine

## 2018-06-30 ENCOUNTER — Encounter: Payer: Self-pay | Admitting: Neurology

## 2018-06-30 MED ORDER — ROPINIROLE HCL 0.5 MG PO TABS: ORAL_TABLET | ORAL | 6 refills | Status: DC

## 2018-07-12 ENCOUNTER — Other Ambulatory Visit: Payer: Self-pay | Admitting: Cardiovascular Disease

## 2018-07-24 ENCOUNTER — Other Ambulatory Visit: Payer: Self-pay | Admitting: Neurology

## 2018-07-29 ENCOUNTER — Other Ambulatory Visit (INDEPENDENT_AMBULATORY_CARE_PROVIDER_SITE_OTHER): Payer: PPO

## 2018-07-29 ENCOUNTER — Other Ambulatory Visit: Payer: Self-pay

## 2018-07-29 DIAGNOSIS — D500 Iron deficiency anemia secondary to blood loss (chronic): Secondary | ICD-10-CM

## 2018-07-29 DIAGNOSIS — H52203 Unspecified astigmatism, bilateral: Secondary | ICD-10-CM | POA: Diagnosis not present

## 2018-07-29 DIAGNOSIS — E119 Type 2 diabetes mellitus without complications: Secondary | ICD-10-CM | POA: Diagnosis not present

## 2018-07-29 DIAGNOSIS — H401131 Primary open-angle glaucoma, bilateral, mild stage: Secondary | ICD-10-CM | POA: Diagnosis not present

## 2018-07-29 LAB — FERRITIN: Ferritin: 14.3 ng/mL (ref 10.0–291.0)

## 2018-08-01 ENCOUNTER — Encounter: Payer: Self-pay | Admitting: Internal Medicine

## 2018-08-01 ENCOUNTER — Encounter: Payer: Self-pay | Admitting: Podiatry

## 2018-08-01 ENCOUNTER — Ambulatory Visit: Payer: PPO | Admitting: Podiatry

## 2018-08-01 DIAGNOSIS — M779 Enthesopathy, unspecified: Secondary | ICD-10-CM

## 2018-08-01 DIAGNOSIS — M778 Other enthesopathies, not elsewhere classified: Secondary | ICD-10-CM

## 2018-08-01 MED ORDER — DICLOFENAC SODIUM 1 % TD GEL: 4.0000 g | Freq: Four times a day (QID) | TRANSDERMAL | Status: DC

## 2018-08-01 MED ORDER — TRIAMCINOLONE ACETONIDE 10 MG/ML IJ SUSP
10.0000 mg | Freq: Once | INTRAMUSCULAR | Status: AC
  Administered 2018-08-01: 10 mg

## 2018-08-01 MED ORDER — DICLOFENAC SODIUM 1 % TD GEL: TRANSDERMAL | 4 refills | Status: DC

## 2018-08-02 NOTE — Progress Notes (Signed)
Subjective:   Patient ID: [name], female   DOB: 67 y.o.   MRN: [id number]   HPI Patient presents stating I am having a lot of pain on top of my feet again and the injections really help me and I want to be able to get through the holidays    ROS      Objective:  Physical Exam  Neurovascular status intact with patient found to have inflammation and pain of the dorsal tendon complex bilateral with probable arthritis midtarsal joint bilateral     Assessment:  Chronic tendinitis dorsally with probability of arthritic processes as contributory to this condition     Plan:  H&P condition reviewed and I did sterile preps and did inject the dorsal tendon complex bilateral 3 mg Kenalog 5 mg Xylocaine again explaining that were trying to reduce the amount of time we do this.  Patient will wear looser shoes and I did write prescription for diclofenac gel to try to reduce the inflammation and I am hoping we can at least give 4 months and if it does get worse we will have to consider CT scans in this condition

## 2018-08-14 ENCOUNTER — Other Ambulatory Visit: Payer: Self-pay | Admitting: Internal Medicine

## 2018-08-14 ENCOUNTER — Other Ambulatory Visit: Payer: Self-pay | Admitting: Cardiovascular Disease

## 2018-09-03 NOTE — Telephone Encounter (Signed)
error 

## 2018-09-11 ENCOUNTER — Ambulatory Visit: Payer: PPO | Admitting: Podiatry

## 2018-09-14 ENCOUNTER — Other Ambulatory Visit: Payer: Self-pay | Admitting: Internal Medicine

## 2018-09-14 ENCOUNTER — Other Ambulatory Visit: Payer: Self-pay | Admitting: Cardiovascular Disease

## 2018-09-15 NOTE — Telephone Encounter (Signed)
Rx request sent to pharmacy.  

## 2018-09-18 ENCOUNTER — Other Ambulatory Visit: Payer: Self-pay | Admitting: Internal Medicine

## 2018-09-18 ENCOUNTER — Encounter: Payer: Self-pay | Admitting: Internal Medicine

## 2018-09-18 DIAGNOSIS — D500 Iron deficiency anemia secondary to blood loss (chronic): Secondary | ICD-10-CM

## 2018-09-21 ENCOUNTER — Other Ambulatory Visit: Payer: Self-pay | Admitting: Internal Medicine

## 2018-09-22 ENCOUNTER — Other Ambulatory Visit (INDEPENDENT_AMBULATORY_CARE_PROVIDER_SITE_OTHER): Payer: PPO

## 2018-09-22 DIAGNOSIS — D500 Iron deficiency anemia secondary to blood loss (chronic): Secondary | ICD-10-CM

## 2018-09-22 LAB — CBC
HCT: 33.7 % — ABNORMAL LOW (ref 36.0–46.0)
Hemoglobin: 11 g/dL — ABNORMAL LOW (ref 12.0–15.0)
MCHC: 32.6 g/dL (ref 30.0–36.0)
MCV: 79.7 fl (ref 78.0–100.0)
Platelets: 235 10*3/uL (ref 150.0–400.0)
RBC: 4.23 Mil/uL (ref 3.87–5.11)
RDW: 15.5 % (ref 11.5–15.5)
WBC: 9.5 10*3/uL (ref 4.0–10.5)

## 2018-09-22 LAB — FERRITIN: Ferritin: 21.1 ng/mL (ref 10.0–291.0)

## 2018-09-23 DIAGNOSIS — S32401A Unspecified fracture of right acetabulum, initial encounter for closed fracture: Secondary | ICD-10-CM | POA: Diagnosis not present

## 2018-09-29 ENCOUNTER — Ambulatory Visit: Payer: PPO | Admitting: Podiatry

## 2018-09-29 ENCOUNTER — Encounter: Payer: Self-pay | Admitting: Podiatry

## 2018-09-29 DIAGNOSIS — M778 Other enthesopathies, not elsewhere classified: Secondary | ICD-10-CM

## 2018-09-29 DIAGNOSIS — M7751 Other enthesopathy of right foot: Secondary | ICD-10-CM

## 2018-09-29 DIAGNOSIS — M779 Enthesopathy, unspecified: Principal | ICD-10-CM

## 2018-09-29 DIAGNOSIS — M7752 Other enthesopathy of left foot: Secondary | ICD-10-CM

## 2018-09-29 MED ORDER — TRIAMCINOLONE ACETONIDE 10 MG/ML IJ SUSP
10.0000 mg | Freq: Once | INTRAMUSCULAR | Status: AC
  Administered 2018-09-29: 10 mg

## 2018-10-01 ENCOUNTER — Ambulatory Visit: Payer: Self-pay

## 2018-10-01 ENCOUNTER — Encounter: Payer: Self-pay | Admitting: Neurology

## 2018-10-01 NOTE — Progress Notes (Signed)
Subjective:   Patient ID: [name], female   DOB: 68 y.o.   MRN: [id number]   HPI Patient presents stating the pain has just reoccurred and I know I need to wait longer to command but I do have a trip coming up and I would like to try to get relief.  States overall the medication is helping her   ROS      Objective:  Physical Exam  Neurovascular status intact with inflammation of the dorsal midfoot bilateral that is moderately improved but still does give her problems     Assessment:  Extensor tendinitis bilateral that is improved but still tender     Plan:  Reviewed conditions and at this point organ to do this but I would like to try to wait longer in between and she will continue with diclofenac gel.  I did sterile prep of each midfoot and then injected the extensor tendon complex 3 mg Kenalog 5 mg Xylocaine and advised on wider shoes and reappoint for [place] to recheck.

## 2018-10-01 NOTE — Telephone Encounter (Signed)
Pt c/o racing heart rate that started yesterday. Pt stated her heart rate was 75 or 80. Her face feels hot and her face turns "burgundy." Pt stated it lasted 12 hours then went away.Symptoms  Came back today. Pt stated her heart was not racing, but is having slight nausea, headache, fatigue, chest tightness and SOB that she feels may be worse. Pt then stated that she has tightness every day. Pt stated that she has h/o a fib and h/o stent placement. Pt stated her calves are swollen to the left calf greater than the right. When pt questioned again about the calf swelling, she stated that the swelling is about the same. Called PCP office and spoke with Gareth Eagle who offered to to go to ED and if pt will not go to ED then get an appt. Care advice given and pt verbalized understanding. Pt given an appt tomorrow.  Reason for Disposition . Problems with anxiety or stress  Answer Assessment - Initial Assessment Questions 1. DESCRIPTION: "Please describe your heart rate or heart beat that you are having" (e.g., fast/slow, regular/irregular, skipped or extra beats, "palpitations")     Racing heart rate 2. ONSET: "When did it start?" (Minutes, hours or days)      yesterday 3. DURATION: "How long does it last" (e.g., seconds, minutes, hours)    12 hours face flushed and hot for 10 hours- today 2 pm 4. PATTERN "Does it come and go, or has it been constant since it started?"  "Does it get worse with exertion?"   "Are you feeling it now?"     Comes and goes  5. TAP: "Using your hand, can you tap out what you are feeling on a chair or table in front of you, so that I can hear?" (Note: not all patients can do this)       Pt cannot perfrom  6. HEART RATE: "Can you tell me your heart rate?" "How many beats in 15 seconds?"  (Note: not all patients can do this)       75 or 80 7. RECURRENT SYMPTOM: "Have you ever had this before?" If so, ask: "When was the last time?" and "What happened that time?"      no 8. CAUSE:  "What do you think is causing the palpitations?"     Pt doesn't know 9. CARDIAC HISTORY: "Do you have any history of heart disease?" (e.g., heart attack, angina, bypass surgery, angioplasty, arrhythmia)      A fib and stent 10. OTHER SYMPTOMS: "Do you have any other symptoms?" (e.g., dizziness, chest pain, sweating, difficulty breathing)       Face gets hot and "burgundy", nauseated, headache, fatigue, tightness and wheeze SOB that has gotten worse, calves are swollen left greater than right 11. PREGNANCY: "Is there any chance you are pregnant?" "When was your last menstrual period?"       n/a  Protocols used: HEART RATE AND HEARTBEAT QUESTIONS-A-AH

## 2018-10-02 ENCOUNTER — Ambulatory Visit (INDEPENDENT_AMBULATORY_CARE_PROVIDER_SITE_OTHER): Payer: PPO | Admitting: Internal Medicine

## 2018-10-02 ENCOUNTER — Encounter: Payer: Self-pay | Admitting: Internal Medicine

## 2018-10-02 ENCOUNTER — Ambulatory Visit: Payer: PPO | Admitting: Internal Medicine

## 2018-10-02 ENCOUNTER — Other Ambulatory Visit (INDEPENDENT_AMBULATORY_CARE_PROVIDER_SITE_OTHER): Payer: PPO

## 2018-10-02 VITALS — BP 138/60 | HR 90 | Temp 98.1°F | Ht 62.0 in | Wt 183.0 lb

## 2018-10-02 DIAGNOSIS — I4821 Permanent atrial fibrillation: Secondary | ICD-10-CM

## 2018-10-02 LAB — POCT URINALYSIS DIPSTICK
Bilirubin, UA: NEGATIVE
Blood, UA: NEGATIVE
Glucose, UA: POSITIVE — AB
Ketones, UA: NEGATIVE
Leukocytes, UA: NEGATIVE
Nitrite, UA: POSITIVE
Protein, UA: POSITIVE — AB
Spec Grav, UA: 1.01 (ref 1.010–1.025)
Urobilinogen, UA: 0.2 E.U./dL
pH, UA: 6 (ref 5.0–8.0)

## 2018-10-02 LAB — COMPREHENSIVE METABOLIC PANEL
ALT: 12 U/L (ref 0–35)
AST: 14 U/L (ref 0–37)
Albumin: 4.3 g/dL (ref 3.5–5.2)
Alkaline Phosphatase: 62 U/L (ref 39–117)
BUN: 21 mg/dL (ref 6–23)
CO2: 25 mEq/L (ref 19–32)
Calcium: 9.4 mg/dL (ref 8.4–10.5)
Chloride: 101 mEq/L (ref 96–112)
Creatinine, Ser: 0.73 mg/dL (ref 0.40–1.20)
GFR: 78.83 mL/min (ref >60.00)
Glucose, Bld: 178 mg/dL — ABNORMAL HIGH (ref 70–99)
Potassium: 4.3 mEq/L (ref 3.5–5.1)
Sodium: 134 mEq/L — ABNORMAL LOW (ref 135–145)
Total Bilirubin: 0.5 mg/dL (ref 0.2–1.2)
Total Protein: 7.6 g/dL (ref 6.0–8.3)

## 2018-10-02 LAB — CBC
HCT: 35 % — ABNORMAL LOW (ref 36.0–46.0)
Hemoglobin: 11.5 g/dL — ABNORMAL LOW (ref 12.0–15.0)
MCHC: 32.8 g/dL (ref 30.0–36.0)
MCV: 79.4 fl (ref 78.0–100.0)
Platelets: 317 10*3/uL (ref 150.0–400.0)
RBC: 4.4 Mil/uL (ref 3.87–5.11)
RDW: 16.1 % — ABNORMAL HIGH (ref 11.5–15.5)
WBC: 10.5 10*3/uL (ref 4.0–10.5)

## 2018-10-02 LAB — LIPID PANEL
Cholesterol: 118 mg/dL (ref 0–200)
HDL: 51.1 mg/dL (ref >39.00)
LDL Cholesterol: 42 mg/dL (ref 0–99)
NonHDL: 66.91
Total CHOL/HDL Ratio: 2
Triglycerides: 123 mg/dL (ref 0.0–149.0)
VLDL: 24.6 mg/dL (ref 0.0–40.0)

## 2018-10-02 LAB — T4, FREE: Free T4: 0.88 ng/dL (ref 0.60–1.60)

## 2018-10-02 LAB — TSH: TSH: 2.21 u[IU]/mL (ref 0.35–4.50)

## 2018-10-02 LAB — FERRITIN: Ferritin: 17.4 ng/mL (ref 10.0–291.0)

## 2018-10-02 LAB — HEMOGLOBIN A1C: Hgb A1c MFr Bld: 6.5 % (ref 4.6–6.5)

## 2018-10-02 MED ORDER — SULFAMETHOXAZOLE-TRIMETHOPRIM 800-160 MG PO TABS: 1.0000 | ORAL_TABLET | Freq: Two times a day (BID) | ORAL | 0 refills | Status: DC

## 2018-10-02 NOTE — Progress Notes (Signed)
   Subjective:   Patient ID: [name], female    DOB: October 19, 1950, 68 y.o.   MRN: [id number]  HPI The patient is a 68 YO female coming in for worsening symptoms with her A fib. Started about 2 days ago with higher HR (90-110s) normal is much lower, taking xarelto and metoprolol which she denies missing. She has been sleeping poorly recently and only a couple hours of sleep. She also is having some urinary frequency in the last several days. She denies chest pains. Does feel horrible when having high heart rate A fib. Stays in A fib all the time but very sensitive to the HR. Having lightheadedness with the last few days. Denies blood in stool. Does have chronic iron deficiency. Worsening RLS recently. Denies flu symptoms. Mild cough but not productive. Mild swelling in legs which is stable from prior. Slept only 3 hours last night. Has not slept well in several days. No recent change in medication or dosing.   PMH, [facility], social history reviewed and updated  Review of Systems  Constitutional: Positive for activity change, appetite change and fatigue. Negative for chills, fever and unexpected weight change.  HENT: Negative.   Eyes: Negative.   Respiratory: Positive for chest tightness and shortness of breath. Negative for cough.   Cardiovascular: Positive for palpitations. Negative for chest pain and leg swelling.  Gastrointestinal: Negative for abdominal distention, abdominal pain, anal bleeding, blood in stool, constipation, diarrhea, nausea and vomiting.  Musculoskeletal: Negative.   Skin: Negative.   Neurological: Positive for weakness.  Psychiatric/Behavioral: Positive for sleep disturbance. The patient is nervous/anxious.     Objective:  Physical Exam Constitutional:      Appearance: She is well-developed.     Comments: Appears tired  HENT:     Head: Normocephalic and atraumatic.  Neck:     Musculoskeletal: Normal range of motion.  Cardiovascular:     Rate and Rhythm: Normal  rate and regular rhythm.  Pulmonary:     Effort: Pulmonary effort is normal. No respiratory distress.     Breath sounds: Normal breath sounds. No wheezing or rales.  Abdominal:     General: Bowel sounds are normal. There is no distension.     Palpations: Abdomen is soft.     Tenderness: There is no abdominal tenderness. There is no rebound.  Musculoskeletal:        General: Swelling present.     Comments: Swelling stable minimal bilateral shins  Skin:    General: Skin is warm and dry.  Neurological:     Mental Status: She is alert and oriented to person, place, and time.     Coordination: Coordination normal.     Vitals:   10/02/18 1106  BP: 138/60  Pulse: 90  Temp: 98.1 F (36.7 C)  TempSrc: Oral  SpO2: 99%  Weight: 183 lb (83 kg)  Height: 5\' 2"  (1.575 m)   EKG: Rate 96, axis normal, interval normal, no st or t wave changes, no change when compared to prior  Assessment & Plan:

## 2018-10-02 NOTE — Assessment & Plan Note (Signed)
EKG done to rule out ST changes which is stable. U/A done to rule out infection. UTI present so will treat with bactrim. Advised to take extra metoprolol today and tomorrow if needed. Checking labs to rule out additional metabolic cause. She has history of anemia and concern that this could be worsening. Checking thyroid as well.

## 2018-10-02 NOTE — Telephone Encounter (Signed)
This should have been a visit yesterday if she refused ER. This sounds very urgent.

## 2018-10-02 NOTE — Telephone Encounter (Signed)
fyi

## 2018-10-02 NOTE — Patient Instructions (Signed)
We will check the blood work today.   We have sent in the bactrim for the urinary infection. Take 1 pill twice a day for 5 days.   We will have you take an extra metoprolol around now and another in the evening if not feeling improved.

## 2018-10-11 ENCOUNTER — Encounter: Payer: Self-pay | Admitting: Family Medicine

## 2018-10-11 ENCOUNTER — Ambulatory Visit (INDEPENDENT_AMBULATORY_CARE_PROVIDER_SITE_OTHER): Payer: PPO | Admitting: Family Medicine

## 2018-10-11 DIAGNOSIS — J441 Chronic obstructive pulmonary disease with (acute) exacerbation: Secondary | ICD-10-CM | POA: Diagnosis not present

## 2018-10-11 DIAGNOSIS — I4821 Permanent atrial fibrillation: Secondary | ICD-10-CM | POA: Diagnosis not present

## 2018-10-11 DIAGNOSIS — I10 Essential (primary) hypertension: Secondary | ICD-10-CM

## 2018-10-11 DIAGNOSIS — J069 Acute upper respiratory infection, unspecified: Secondary | ICD-10-CM

## 2018-10-11 MED ORDER — CEFDINIR 300 MG PO CAPS: 300.0000 mg | ORAL_CAPSULE | Freq: Two times a day (BID) | ORAL | 0 refills | Status: AC

## 2018-10-11 MED ORDER — FLUTICASONE PROPIONATE 50 MCG/ACT NA SUSP: 2.0000 | Freq: Every day | NASAL | 6 refills | Status: DC

## 2018-10-11 MED ORDER — BENZONATATE 100 MG PO CAPS: 100.0000 mg | ORAL_CAPSULE | Freq: Three times a day (TID) | ORAL | Status: DC | PRN

## 2018-10-11 NOTE — Assessment & Plan Note (Signed)
Has not been using her Symbicort so will restart and the albuterol can cause some palpitations so she uses it sparingly

## 2018-10-11 NOTE — Assessment & Plan Note (Signed)
Encouraged increased rest and hydration, add probiotics, zinc such as Coldeze or Xicam. Treat fevers as needed. Elderberry and Zinc   Coricidan HBP for cough as needed  Cefdinir is the antibiotic take it if worsens, fevers develop or no improvement by end or second week  Albuterol prior to going out in cold air and as needed  Flonase daily  Tylenol up to 3 x a day as needed

## 2018-10-11 NOTE — Assessment & Plan Note (Signed)
Rate controlled despite illness

## 2018-10-11 NOTE — Patient Instructions (Addendum)
Encouraged increased rest and hydration, add probiotics, zinc such as Coldeze or Xicam. Treat fevers as needed. Elderberry and Zinc   Coricidan HBP for cough as needed  Cefdinir is the antibiotic take it if worsens, fevers develop or no improvement by end or second week  Albuterol prior to going out in cold air and as needed  Flonase daily  Tylenol up to 3 x a day as needed Upper Respiratory Infection, Adult An upper respiratory infection (URI) is a common viral infection of the nose, throat, and upper air passages that lead to the lungs. The most common type of URI is the common cold. URIs usually get better on their own, without medical treatment. What are the causes? A URI is caused by a virus. You may catch a virus by:  Breathing in droplets from an infected person's cough or sneeze.  Touching something that has been exposed to the virus (contaminated) and then touching your mouth, nose, or eyes. What increases the risk? You are more likely to get a URI if:  You are very young or very old.  It is autumn or winter.  You have close contact with others, such as at a daycare, school, or health care facility.  You smoke.  You have long-term (chronic) heart or lung disease.  You have a weakened disease-fighting (immune) system.  You have nasal allergies or asthma.  You are experiencing a lot of stress.  You work in an area that has poor air circulation.  You have poor nutrition. What are the signs or symptoms? A URI usually involves some of the following symptoms:  Runny or stuffy (congested) nose.  Sneezing.  Cough.  Sore throat.  Headache.  Fatigue.  Fever.  Loss of appetite.  Pain in your forehead, behind your eyes, and over your cheekbones (sinus pain).  Muscle aches.  Redness or irritation of the eyes.  Pressure in the ears or face. How is this diagnosed? This condition may be diagnosed based on your medical history and symptoms, and a physical  exam. Your health care provider may use a cotton swab to take a mucus sample from your nose (nasal swab). This sample can be tested to determine what virus is causing the illness. How is this treated? URIs usually get better on their own within 7-10 days. You can take steps at home to relieve your symptoms. Medicines cannot cure URIs, but your health care provider may recommend certain medicines to help relieve symptoms, such as:  Over-the-counter cold medicines.  Cough suppressants. Coughing is a type of defense against infection that helps to clear the respiratory system, so take these medicines only as recommended by your health care provider.  Fever-reducing medicines. Follow these instructions at home: Activity  Rest as needed.  If you have a fever, stay home from work or school until your fever is gone or until your health care provider says you are no longer contagious. Your health care provider may have you wear a face mask to prevent your infection from spreading. Relieving symptoms  Gargle with a salt-water mixture 3-4 times a day or as needed. To make a salt-water mixture, completely dissolve -1 tsp of salt in 1 cup of warm water.  Use a cool-mist humidifier to add moisture to the air. This can help you breathe more easily. Eating and drinking   Drink enough fluid to keep your urine pale yellow.  Eat soups and other clear broths. General instructions   Take over-the-counter and prescription medicines only  as told by your health care provider. These include cold medicines, fever reducers, and cough suppressants.  Do not use any products that contain nicotine or tobacco, such as cigarettes and e-cigarettes. If you need help quitting, ask your health care provider.  Stay away from secondhand smoke.  Stay up to date on all immunizations, including the yearly (annual) flu vaccine.  Keep all follow-up visits as told by your health care provider. This is important. How to  prevent the spread of infection to others   URIs can be passed from person to person (are contagious). To prevent the infection from spreading: ? Wash your hands often with soap and water. If soap and water are not available, use hand sanitizer. ? Avoid touching your mouth, face, eyes, or nose. ? Cough or sneeze into a tissue or your sleeve or elbow instead of into your hand or into the air. Contact a health care provider if:  You are getting worse instead of better.  You have a fever or chills.  Your mucus is brown or red.  You have yellow or brown discharge coming from your nose.  You have pain in your face, especially when you bend forward.  You have swollen neck glands.  You have pain while swallowing.  You have white areas in the back of your throat. Get help right away if:  You have shortness of breath that gets worse.  You have severe or persistent: ? Headache. ? Ear pain. ? Sinus pain. ? Chest pain.  You have chronic lung disease along with any of the following: ? Wheezing. ? Prolonged cough. ? Coughing up blood. ? A change in your usual mucus.  You have a stiff neck.  You have changes in your: ? Vision. ? Hearing. ? Thinking. ? Mood. Summary  An upper respiratory infection (URI) is a common infection of the nose, throat, and upper air passages that lead to the lungs.  A URI is caused by a virus.  URIs usually get better on their own within 7-10 days.  Medicines cannot cure URIs, but your health care provider may recommend certain medicines to help relieve symptoms. This information is not intended to replace advice given to you by your health care provider. Make sure you discuss any questions you have with your health care provider. Document Released: 01/23/2001 Document Revised: 03/15/2017 Document Reviewed: 03/15/2017 Elsevier Interactive Patient Education  2019 [employer].

## 2018-10-11 NOTE — Assessment & Plan Note (Signed)
Well controlled, no changes to meds. Encouraged heart healthy diet such as the DASH diet and exercise as tolerated.  °

## 2018-10-11 NOTE — Progress Notes (Signed)
Subjective:    Patient ID: [name], female    DOB: Dec 22, 1950, 68 y.o.   MRN: [id number]  Chief Complaint  Patient presents with  . Cough    x 3 days, expectorating clear sputum  . sinus pressure    headache    HPI Patient is in today for evaluation of cough and congestion. Numerous people in her home have been ill with severe upper respiratory illnesses for past week or two. She has noted an increase in cough sometimes productive of clear phlegm and sometimes dry. in past 3 or so days. Then yesterday developed of facial pressure and headache which has been responsive to Tylenol temporarily and then it returns. Nasal congestion is increasing as well. With her COPD she notes getting very sick quickly on occasion. No obvious fevers or chills thus far. Denies CP/palp/HA/congestion/fevers/GI or GU c/o. Taking meds as prescribed. Has had some minor episodes of SOB and wheezing and has used Albuterol sparingly with good results but it can make her heart race easily so she tries to minimize use.  Past Medical History:  Diagnosis Date  . Anemia, unspecified   . Arthritis   . Asthma   . Atrial fibrillation with RVR ([place])    a. on Xarelto  . Bell's palsy    Facial nerve decompression in 2001  . Chronic low back pain   . COPD with asthma ([place])   . Coronary artery disease    Myoview 04/12/11 was entirely normal. ECHO 02/26/08 showed only minor abnormalities. Stenting 05/26/08 of her posterolateral branch to the left circumflex coronary artery. Used a 2.5x79mm Taxus Monorail stent.myoview 2014 was without ischemia  . Diabetes mellitus    Type 2  . Early cataracts, bilateral   . Fatty liver   . GERD (gastroesophageal reflux disease)   . Glaucoma   . Glaucoma   . Goiter   . Heart murmur   . History of nuclear stress test 2012; 2014   lexiscan; normal pattern of perfusion; normal, low risk scan   . Hyperlipidemia   . Hypertension   . Panic disorder   . Pneumonia 2008  . Polycystic  ovary disease    Hysterectomy in 1982 for this  . Shortness of breath dyspnea    ECHO 02/26/08 showed only minor abnormalities  . Spinal stenosis     Past Surgical History:  Procedure Laterality Date  . ABDOMINAL HYSTERECTOMY  1982   & BSO; for polycystic ovary disease  . CARDIOVERSION N/A 12/17/2013   Procedure: CARDIOVERSION;  Surgeon: Pixie Casino, MD;  Location: [facility] ENDOSCOPY;  Service: Cardiovascular;  Laterality: N/A;  . CARDIOVERSION N/A 09/30/2015   Procedure: CARDIOVERSION;  Surgeon: Dorothy Spark, MD;  Location: [facility] ENDOSCOPY;  Service: Cardiovascular;  Laterality: N/A;  . CARDIOVERSION N/A 06/22/2016   Procedure: CARDIOVERSION;  Surgeon: Skeet Latch, MD;  Location: [place];  Service: Cardiovascular;  Laterality: N/A;  . COLONOSCOPY     last 2009; Dr Cristina Gong; due 2019  . COLONOSCOPY WITH PROPOFOL N/A 01/23/2018   Procedure: COLONOSCOPY WITH PROPOFOL;  Surgeon: Ronald Lobo, MD;  Location: [place];  Service: Endoscopy;  Laterality: N/A;  . CORONARY ANGIOPLASTY  05/26/2008   Stenting of her posterolateral branch to the left circumflex coronary artery. Used a 2.5x50mm Taxus Monorail stent.  . ESOPHAGOGASTRODUODENOSCOPY (EGD) WITH PROPOFOL N/A 01/23/2018   Procedure: ESOPHAGOGASTRODUODENOSCOPY (EGD) WITH PROPOFOL;  Surgeon: Ronald Lobo, MD;  Location: [place];  Service: Endoscopy;  Laterality: N/A;  . FACIAL NERVE DECOMPRESSION  2001/2002   bells palsy   . LAPAROSCOPIC CHOLECYSTECTOMY  06/15/2011    Dr Dalbert Batman  . TEE WITHOUT CARDIOVERSION N/A 12/17/2013   Procedure: TRANSESOPHAGEAL ECHOCARDIOGRAM (TEE);  Surgeon: Pixie Casino, MD;  Location: [facility] ENDOSCOPY;  Service: Cardiovascular;  Laterality: N/A;  trish/ja  . TRANSTHORACIC ECHOCARDIOGRAM  3/54/6568   LV systolic function normal with mild conc LVH; LA mildly dilated; trace MR/TR  . UPPER GI ENDOSCOPY  2009   negative    Family History  Problem Relation Age of Onset  . Heart attack Father 59        2nd MI at 76  . Colon cancer Brother 14  . Gout Brother   . Ulcers Mother   . Emphysema Mother 21  . Colon polyps Sister   . Cancer Sister        Basal cell carcinoma  . Pneumonia Maternal Grandmother   . Hypertension Brother   . Hyperlipidemia Brother   . Cancer Brother        Skin  . Diabetes Neg Hx   . Stroke Neg Hx     Social History   Socioeconomic History  . Marital status: Married    Spouse name: Not on file  . Number of children: 2  . Years of education: master's  . Highest education level: Not on file  Occupational History  . Occupation: Forensic scientist: [employer]  . Financial resource strain: Not on file  . Food insecurity:    Worry: Not on file    Inability: Not on file  . Transportation needs:    Medical: Not on file    Non-medical: Not on file  Tobacco Use  . Smoking status: Former Smoker    Packs/day: 1.50    Years: 30.00    Pack years: 45.00    Types: Cigarettes    Last attempt to quit: 08/13/1993    Years since quitting: 25.1  . Smokeless tobacco: Never Used  Substance and Sexual Activity  . Alcohol use: No  . Drug use: No  . Sexual activity: Not on file  Lifestyle  . Physical activity:    Days per week: Not on file    Minutes per session: Not on file  . Stress: Not on file  Relationships  . Social connections:    Talks on phone: Not on file    Gets together: Not on file    Attends religious service: Not on file    Active member of club or organization: Not on file    Attends meetings of clubs or organizations: Not on file    Relationship status: Not on file  . Intimate partner violence:    Fear of current or ex partner: Not on file    Emotionally abused: Not on file    Physically abused: Not on file    Forced sexual activity: Not on file  Other Topics Concern  . Not on file  Social History Narrative  . Not on file    Outpatient Medications Prior to Visit  Medication Sig Dispense Refill    . ascorbic acid (VITAMIN C) 1000 MG tablet Take 1,000 mg by mouth 2 (two) times daily.     [name] aspirin EC 81 MG tablet Take 1 tablet (81 mg total) by mouth daily. 90 tablet 3  . budesonide-formoterol (SYMBICORT) 160-4.5 MCG/ACT inhaler USE 2 PUFFS TWICE DAILY (Patient taking differently: Inhale 2 puffs into the lungs 2 (two) times daily  as needed (asthma). USE 2 PUFFS TWICE DAILY AS NEEDED) 10.2 g 4  . CALCIUM-MAG-VIT C-VIT D PO Take 1 tablet by mouth daily.     . celecoxib (CELEBREX) 200 MG capsule Take 200 mg by mouth 2 (two) times daily.     . Continuous Blood Gluc Sensor (FREESTYLE LIBRE SENSOR SYSTEM) MISC Use to monitor sugars 1 each 0  . diclofenac sodium (VOLTAREN) 1 % GEL Apply 4 times daily 1 Tube 4  . diltiazem (CARDIZEM CD) 180 MG 24 hr capsule TAKE 1 CAPSULE DAILY. 90 capsule 0  . gabapentin (NEURONTIN) 100 MG capsule Take 1 capsule (100 mg total) by mouth 3 (three) times daily. 180 capsule 6  . guaiFENesin (MUCINEX) 600 MG 12 hr tablet Take 600 mg by mouth 2 (two) times daily.    [name] HYDROcodone-acetaminophen (NORCO/VICODIN) 5-325 MG tablet Take 1 tablet by mouth daily as needed for moderate pain.   0  . INVOKANA 100 MG TABS tablet TAKE 1 TABLET ONCE DAILY. 90 tablet 1  . isosorbide mononitrate (IMDUR) 30 MG 24 hr tablet Take 0.5 tablets (15 mg total) by mouth daily. 45 tablet 2  . latanoprost (XALATAN) 0.005 % ophthalmic solution Place 1 drop into both eyes at bedtime.    [name] LORazepam (ATIVAN) 0.5 MG tablet TAKE 1 OR 2 TABLETS AT BEDTIME. 60 tablet 5  . Melatonin 5 MG TABS Take 1 tablet by mouth at bedtime.     . metoprolol succinate (TOPROL-XL) 50 MG 24 hr tablet TAKE 3 TABLETS ONCE DAILY WITH FOOD. 270 tablet 2  . Multiple Vitamins-Iron (MULTIVITAMINS WITH IRON) TABS Take 1 tablet by mouth 2 (two) times daily.     [name] nystatin ointment (MYCOSTATIN) Apply 1 application topically 2 (two) times daily as needed (IRRITATION).   0  . omega-3 acid ethyl esters (LOVAZA) 1 G capsule Take 1 g by  mouth daily.     [name] omeprazole (PRILOSEC) 20 MG capsule Take 2 capsules (40 mg total) by mouth daily. 60 capsule 0  . ONETOUCH DELICA LANCETS FINE MISC 1 Units by Does not apply route as directed. 100 each 3  . PARoxetine (PAXIL) 10 MG tablet Take 2.5 tablets (25 mg total) by mouth daily. 225 tablet 11  . quinapril (ACCUPRIL) 40 MG tablet TAKE 1 TABLET ONCE DAILY. 90 tablet 3  . rOPINIRole (REQUIP XL) 2 MG 24 hr tablet TAKE ONE TABLET AT BEDTIME. 30 tablet 5  . rOPINIRole (REQUIP) 0.5 MG tablet Take 1 to 2 tab at midnight. ( XL form at 6 PM) 60 tablet 6  . rosuvastatin (CRESTOR) 10 MG tablet TAKE ONE TABLET AT BEDTIME. 90 tablet 1  . senna (SENOKOT) 8.6 MG TABS Take 1 tablet by mouth daily.     . sitaGLIPtin-metformin (JANUMET) 50-1000 MG tablet TAKE 1/2 TABLET IN THE MORNING WITH BREAKFAST AND 1 TABLET IN THE EVENING WITH SUPPER. 45 tablet 1  . XARELTO 20 MG TABS tablet TAKE 1 TABLET ONCE DAILY WITH SUPPER. 30 tablet 3  . XOPENEX HFA 45 MCG/ACT inhaler USE 1 TO 2 PUFFS EVERY 6 HOURS AS NEEDED FOR WHEEZING. 15 g 0  . levalbuterol (XOPENEX) 0.63 MG/3ML nebulizer solution Take 3 mLs (0.63 mg total) by nebulization every 6 (six) hours as needed for wheezing or shortness of breath. (Patient not taking: Reported on 10/11/2018) 75 mL 1  . sulfamethoxazole-trimethoprim (BACTRIM DS,SEPTRA DS) 800-160 MG tablet Take 1 tablet by mouth 2 (two) times daily. 10 tablet 0   No facility-administered medications prior  to visit.     Allergies  Allergen Reactions  . Benadryl [Diphenhydramine Hcl] Other (See Comments)    Restless leg  . Clopidogrel Bisulfate Nausea Only    Nausea & pain  . Contrast Media [Iodinated Diagnostic Agents] Palpitations    Rapid heart rate, hot    Review of Systems  Constitutional: Positive for malaise/fatigue. Negative for fever.  HENT: Positive for congestion and sinus pain.   Eyes: Negative for blurred vision.  Respiratory: Positive for sputum production, shortness of breath  and wheezing.   Cardiovascular: Negative for chest pain, palpitations and leg swelling.  Gastrointestinal: Negative for abdominal pain, blood in stool and nausea.  Genitourinary: Negative for dysuria and frequency.  Musculoskeletal: Negative for falls.  Skin: Negative for rash.  Neurological: Positive for headaches. Negative for dizziness and loss of consciousness.  Endo/Heme/Allergies: Negative for environmental allergies.  Psychiatric/Behavioral: Negative for depression. The patient is not nervous/anxious.        Objective:    Physical Exam Vitals signs and nursing note reviewed.  Constitutional:      General: She is not in acute distress.    Appearance: She is well-developed.  HENT:     Head: Normocephalic and atraumatic.     Nose: Nose normal.  Eyes:     General:        Right eye: No discharge.        Left eye: No discharge.  Neck:     Musculoskeletal: Normal range of motion and neck supple.  Cardiovascular:     Rate and Rhythm: Normal rate. Rhythm irregular.     Heart sounds: No murmur.  Pulmonary:     Effort: Pulmonary effort is normal.     Breath sounds: Wheezing present.  Abdominal:     General: Bowel sounds are normal.     Palpations: Abdomen is soft.     Tenderness: There is no abdominal tenderness.  Skin:    General: Skin is warm and dry.  Neurological:     Mental Status: She is alert and oriented to person, place, and time.     BP 130/64 (BP Location: Left Arm, Patient Position: Sitting, Cuff Size: Large)   Pulse 67   Temp (!) 97.5 F (36.4 C) (Oral)   Ht 5\' 2"  (1.575 m)   Wt 196 lb 8 oz (89.1 kg)   SpO2 99%   BMI 35.94 kg/m  Wt Readings from Last 3 Encounters:  10/11/18 196 lb 8 oz (89.1 kg)  10/02/18 183 lb (83 kg)  06/10/18 183 lb (83 kg)     Lab Results  Component Value Date   WBC 10.5 10/02/2018   HGB 11.5 (L) 10/02/2018   HCT 35.0 (L) 10/02/2018   PLT 317.0 10/02/2018   GLUCOSE 178 (H) 10/02/2018   CHOL 118 10/02/2018   TRIG 123.0  10/02/2018   HDL 51.10 10/02/2018   LDLDIRECT 143.4 01/23/2008   LDLCALC 42 10/02/2018   ALT 12 10/02/2018   AST 14 10/02/2018   NA 134 (L) 10/02/2018   K 4.3 10/02/2018   CL 101 10/02/2018   CREATININE 0.73 10/02/2018   BUN 21 10/02/2018   CO2 25 10/02/2018   TSH 2.21 10/02/2018   HGBA1C 6.5 10/02/2018   MICROALBUR 15.8 (H) 06/09/2018    Lab Results  Component Value Date   TSH 2.21 10/02/2018   Lab Results  Component Value Date   WBC 10.5 10/02/2018   HGB 11.5 (L) 10/02/2018   HCT 35.0 (L) 10/02/2018   MCV  79.4 10/02/2018   PLT 317.0 10/02/2018   Lab Results  Component Value Date   NA 134 (L) 10/02/2018   K 4.3 10/02/2018   CO2 25 10/02/2018   GLUCOSE 178 (H) 10/02/2018   BUN 21 10/02/2018   CREATININE 0.73 10/02/2018   BILITOT 0.5 10/02/2018   ALKPHOS 62 10/02/2018   AST 14 10/02/2018   ALT 12 10/02/2018   PROT 7.6 10/02/2018   ALBUMIN 4.3 10/02/2018   CALCIUM 9.4 10/02/2018   ANIONGAP 8 01/03/2018   GFR 78.83 10/02/2018   Lab Results  Component Value Date   CHOL 118 10/02/2018   Lab Results  Component Value Date   HDL 51.10 10/02/2018   Lab Results  Component Value Date   LDLCALC 42 10/02/2018   Lab Results  Component Value Date   TRIG 123.0 10/02/2018   Lab Results  Component Value Date   CHOLHDL 2 10/02/2018   Lab Results  Component Value Date   HGBA1C 6.5 10/02/2018       Assessment & Plan:   Problem List Items Addressed This Visit    Essential hypertension    Well controlled, no changes to meds. Encouraged heart healthy diet such as the DASH diet and exercise as tolerated.       Chronic obstructive lung disease ([place])    Has not been using her Symbicort so will restart and the albuterol can cause some palpitations so she uses it sparingly      Relevant Medications   fluticasone (FLONASE) 50 MCG/ACT nasal spray   benzonatate (TESSALON) 100 MG capsule   Permanent atrial fibrillation    Rate controlled despite illness       Upper respiratory infection    Encouraged increased rest and hydration, add probiotics, zinc such as Coldeze or Xicam. Treat fevers as needed. Elderberry and Zinc   Coricidan HBP for cough as needed  Cefdinir is the antibiotic take it if worsens, fevers develop or no improvement by end or second week  Albuterol prior to going out in cold air and as needed  Flonase daily  Tylenol up to 3 x a day as needed      Relevant Medications   cefdinir (OMNICEF) 300 MG capsule      I have discontinued Vonne N. Sowles's sulfamethoxazole-trimethoprim. I am also having her start on fluticasone, cefdinir, and benzonatate. Additionally, I am having her maintain her CALCIUM-MAG-VIT C-VIT D PO, senna, Melatonin, multivitamins with iron, omega-3 acid ethyl esters, ascorbic acid, ONETOUCH DELICA LANCETS FINE, guaiFENesin, latanoprost, celecoxib, XOPENEX HFA, levalbuterol, HYDROcodone-acetaminophen, nystatin ointment, FREESTYLE LIBRE SENSOR SYSTEM, PARoxetine, gabapentin, budesonide-formoterol, omeprazole, LORazepam, isosorbide mononitrate, rosuvastatin, aspirin EC, XARELTO, rOPINIRole, rOPINIRole, diclofenac sodium, metoprolol succinate, diltiazem, sitaGLIPtin-metformin, quinapril, and INVOKANA.  Meds ordered this encounter  Medications  . fluticasone (FLONASE) 50 MCG/ACT nasal spray    Sig: Place 2 sprays into both nostrils daily.    Dispense:  16 g    Refill:  6  . cefdinir (OMNICEF) 300 MG capsule    Sig: Take 1 capsule (300 mg total) by mouth 2 (two) times daily for 10 days.    Dispense:  20 capsule    Refill:  0  . benzonatate (TESSALON) 100 MG capsule    Sig: Take 1 capsule (100 mg total) by mouth 3 (three) times daily as needed for cough.    Dispense:  30 capsule    Refill:  01     Penni Homans, MD

## 2018-10-12 ENCOUNTER — Other Ambulatory Visit: Payer: Self-pay | Admitting: Internal Medicine

## 2018-10-13 ENCOUNTER — Other Ambulatory Visit: Payer: Self-pay | Admitting: Internal Medicine

## 2018-10-13 NOTE — Telephone Encounter (Signed)
Control database checked last refill: 07/22/2018 LOV: 10/02/2018 acute, 06/09/2018 CPE [id number]

## 2018-10-20 ENCOUNTER — Other Ambulatory Visit: Payer: Self-pay | Admitting: Cardiovascular Disease

## 2018-11-05 ENCOUNTER — Telehealth: Payer: Self-pay | Admitting: Cardiovascular Disease

## 2018-11-05 NOTE — Telephone Encounter (Signed)
Pt c/o swelling: STAT is pt has developed SOB within 24 hours  1) How much weight have you gained and in what time span? 15 since January  2) If swelling, where is the swelling located? Left leg  3) Are you currently taking a fluid pill? No  4) Are you currently SOB? Yes   Do you have a log of your daily weights (if so, list)? No  5) Have you gained 3 pounds in a day or 5 pounds in a week? Yes   6) Have you traveled recently? No

## 2018-11-06 ENCOUNTER — Encounter: Payer: Self-pay | Admitting: Physician Assistant

## 2018-11-06 ENCOUNTER — Other Ambulatory Visit: Payer: Self-pay

## 2018-11-06 ENCOUNTER — Ambulatory Visit: Payer: PPO | Admitting: Physician Assistant

## 2018-11-06 VITALS — BP 138/68 | HR 76 | Ht 62.0 in | Wt 202.8 lb

## 2018-11-06 DIAGNOSIS — I5033 Acute on chronic diastolic (congestive) heart failure: Secondary | ICD-10-CM | POA: Diagnosis not present

## 2018-11-06 DIAGNOSIS — J449 Chronic obstructive pulmonary disease, unspecified: Secondary | ICD-10-CM

## 2018-11-06 DIAGNOSIS — R0789 Other chest pain: Secondary | ICD-10-CM | POA: Diagnosis not present

## 2018-11-06 DIAGNOSIS — E119 Type 2 diabetes mellitus without complications: Secondary | ICD-10-CM

## 2018-11-06 DIAGNOSIS — I4821 Permanent atrial fibrillation: Secondary | ICD-10-CM

## 2018-11-06 DIAGNOSIS — E785 Hyperlipidemia, unspecified: Secondary | ICD-10-CM

## 2018-11-06 DIAGNOSIS — I10 Essential (primary) hypertension: Secondary | ICD-10-CM

## 2018-11-06 DIAGNOSIS — I25119 Atherosclerotic heart disease of native coronary artery with unspecified angina pectoris: Secondary | ICD-10-CM | POA: Diagnosis not present

## 2018-11-06 DIAGNOSIS — I509 Heart failure, unspecified: Secondary | ICD-10-CM | POA: Diagnosis not present

## 2018-11-06 MED ORDER — FUROSEMIDE 20 MG PO TABS: 20.0000 mg | ORAL_TABLET | Freq: Every day | ORAL | 3 refills | Status: DC

## 2018-11-06 NOTE — Patient Instructions (Addendum)
Medication Instructions:   START LASIX 20 MG DAILY  If you need a refill on your cardiac medications before your next appointment, please call your pharmacy.   Lab work: You will need to have labs (blood work) drawn in 1-2 weeks:  BMET  If you have labs (blood work) drawn today and your tests are completely normal, you will receive your results only by: [name] MyChart Message (if you have MyChart) OR . A paper copy in the mail If you have any lab test that is abnormal or we need to change your treatment, we will call you to review the results.  Testing/Procedures:  PLEASE SCHEDULE FOR 2 WEEKS   Your physician has requested that you have an Echocardiogram. Echocardiography is a painless test that uses sound waves to create images of your heart. It provides your doctor with information about the size and shape of your heart and how well your heart's chambers and valves are working. This procedure takes approximately one hour. There are no restrictions for this procedure.    Follow-Up: At [facility], you and your health needs are our priority.  As part of our continuing mission to provide you with exceptional heart care, we have created designated Provider Care Teams.  These Care Teams include your primary Cardiologist (physician) and Advanced Practice Providers (APPs -  Physician Assistants and Nurse Practitioners) who all work together to provide you with the care you need, when you need it. . You will need a follow up appointment in 3 months with Quay Burow, MD. . Your physician recommends that you schedule a follow-up appointment in 3-4 weeks as a Televisit with Almyra Deforest, PA-C   Any Other Special Instructions Will Be Listed Below (If Applicable). KEEP a log/diary of your daily weights  LIMIT your FLUID INTAKE to 32-64 ounces a day  LIMIT your SALT INTAKE to less than 1500  a day

## 2018-11-06 NOTE — Progress Notes (Signed)
Cardiology Office Note    Date:  11/06/2018   ID:  [name], DOB July 05, 1951, MRN [id number]  PCP:  Hoyt Koch, MD  Cardiologist:  Dr. Gwenlyn Found   Chief Complaint  Patient presents with  . Follow-up    seen for Dr. Gwenlyn Found    History of Present Illness:  [name] is a 68 y.o. female with PMH of CAD, permanent atrial fibrillation, DM II, HTN, HLD and COPD.  She is a former patient of Dr. Melvern Banker.  Dr. Melvern Banker stented her distal left circumflex posterolateral branch in October 2009, there was some residual disease in the PDA branch.  EF was normal.  Due to her recurrent chest discomfort, cardiac catheterization was performed in September 2012 revealing a patent stent, 50% distal PDA lesion and normal EF.  Myoview in August 2016 was low risk.  Last echocardiogram obtained on 07/13/2016 showed EF 60 to 65%, mild MR.  Her last office visit was with Kerin Ransom on 06/10/2018, at which time she was complaining of elevated blood pressure.  Patient presents today for cardiology office visit.  She has gained 20 pounds since late February.  She will occasionally have some tightness in the chest and shortness of breath as well.  There is no EKG changes when compared to previous EKG.  On physical exam, she has at least 2-3+ pitting edema in the left lower extremity, there is 1+ pitting edema in the right lower extremity.  On physical exam, she has diminished breath sounds in the right base of the lung.  It is unclear to me what is causing her to accumulate fluid suddenly in the past month.  She is not on a diuretic, I will start her on 40 mg daily of Lasix.  I recommended a echocardiogram in the next 2 weeks to assess her EF, if her EF is down, she will need ischemic work-up.  With the recent COVID-19, this may cause some delay in her echocardiogram, however I do feel that with this new onset of heart failure, a relatively soon echocardiogram is still warranted.   Past Medical History:   Diagnosis Date  . Anemia, unspecified   . Arthritis   . Asthma   . Atrial fibrillation with RVR ([place])    a. on Xarelto  . Bell's palsy    Facial nerve decompression in 2001  . Chronic low back pain   . COPD with asthma ([place])   . Coronary artery disease    Myoview 04/12/11 was entirely normal. ECHO 02/26/08 showed only minor abnormalities. Stenting 05/26/08 of her posterolateral branch to the left circumflex coronary artery. Used a 2.5x59mm Taxus Monorail stent.myoview 2014 was without ischemia  . Diabetes mellitus    Type 2  . Early cataracts, bilateral   . Fatty liver   . GERD (gastroesophageal reflux disease)   . Glaucoma   . Glaucoma   . Goiter   . Heart murmur   . History of nuclear stress test 2012; 2014   lexiscan; normal pattern of perfusion; normal, low risk scan   . Hyperlipidemia   . Hypertension   . Panic disorder   . Pneumonia 2008  . Polycystic ovary disease    Hysterectomy in 1982 for this  . Shortness of breath dyspnea    ECHO 02/26/08 showed only minor abnormalities  . Spinal stenosis     Past Surgical History:  Procedure Laterality Date  . ABDOMINAL HYSTERECTOMY  1982   & BSO; for polycystic ovary disease  .  CARDIOVERSION N/A 12/17/2013   Procedure: CARDIOVERSION;  Surgeon: Pixie Casino, MD;  [facility] ENDOSCOPY;  Service: Cardiovascular;  Laterality: N/A;  . CARDIOVERSION N/A 09/30/2015   Procedure: CARDIOVERSION;  Surgeon: Dorothy Spark, MD;  Location: [facility] ENDOSCOPY;  Service: Cardiovascular;  Laterality: N/A;  . CARDIOVERSION N/A 06/22/2016   Procedure: CARDIOVERSION;  Surgeon: Skeet Latch, MD;  Location: [place];  Service: Cardiovascular;  Laterality: N/A;  . COLONOSCOPY     last 2009; Dr Cristina Gong; due 2019  . COLONOSCOPY WITH PROPOFOL N/A 01/23/2018   Procedure: COLONOSCOPY WITH PROPOFOL;  Surgeon: Ronald Lobo, MD;  Location: [place];  Service: Endoscopy;  Laterality: N/A;  . CORONARY ANGIOPLASTY  05/26/2008   Stenting of  her posterolateral branch to the left circumflex coronary artery. Used a 2.5x57mm Taxus Monorail stent.  . ESOPHAGOGASTRODUODENOSCOPY (EGD) WITH PROPOFOL N/A 01/23/2018   Procedure: ESOPHAGOGASTRODUODENOSCOPY (EGD) WITH PROPOFOL;  Surgeon: Ronald Lobo, MD;  Location: [place];  Service: Endoscopy;  Laterality: N/A;  . FACIAL NERVE DECOMPRESSION  2001/2002   bells palsy   . LAPAROSCOPIC CHOLECYSTECTOMY  06/15/2011    Dr Dalbert Batman  . TEE WITHOUT CARDIOVERSION N/A 12/17/2013   Procedure: TRANSESOPHAGEAL ECHOCARDIOGRAM (TEE);  Surgeon: Pixie Casino, MD;  Location: [facility] ENDOSCOPY;  Service: Cardiovascular;  Laterality: N/A;  trish/ja  . TRANSTHORACIC ECHOCARDIOGRAM  3/32/9518   LV systolic function normal with mild conc LVH; LA mildly dilated; trace MR/TR  . UPPER GI ENDOSCOPY  2009   negative    Current Medications: Outpatient Medications Prior to Visit  Medication Sig Dispense Refill  . ascorbic acid (VITAMIN C) 1000 MG tablet Take 1,000 mg by mouth 2 (two) times daily.     [name] aspirin EC 81 MG tablet Take 1 tablet (81 mg total) by mouth daily. 90 tablet 3  . budesonide-formoterol (SYMBICORT) 160-4.5 MCG/ACT inhaler USE 2 PUFFS TWICE DAILY (Patient taking differently: Inhale 2 puffs into the lungs 2 (two) times daily as needed (asthma). USE 2 PUFFS TWICE DAILY AS NEEDED) 10.2 g 4  . CALCIUM-MAG-VIT C-VIT D PO Take 1 tablet by mouth daily.     . celecoxib (CELEBREX) 200 MG capsule Take 200 mg by mouth 2 (two) times daily.     . Continuous Blood Gluc Sensor (FREESTYLE LIBRE SENSOR SYSTEM) MISC Use to monitor sugars 1 each 0  . diclofenac sodium (VOLTAREN) 1 % GEL Apply 4 times daily 1 Tube 4  . diltiazem (CARDIZEM CD) 180 MG 24 hr capsule TAKE 1 CAPSULE DAILY. 90 capsule 0  . gabapentin (NEURONTIN) 100 MG capsule TAKE (1) CAPSULE THREE TIMES DAILY. 180 capsule 0  . guaiFENesin (MUCINEX) 600 MG 12 hr tablet Take 600 mg by mouth 2 (two) times daily.    . INVOKANA 100 MG TABS tablet TAKE 1  TABLET ONCE DAILY. 90 tablet 1  . isosorbide mononitrate (IMDUR) 30 MG 24 hr tablet Take 0.5 tablets (15 mg total) by mouth daily. 45 tablet 2  . latanoprost (XALATAN) 0.005 % ophthalmic solution Place 1 drop into both eyes at bedtime.    . levalbuterol (XOPENEX) 0.63 MG/3ML nebulizer solution Take 3 mLs (0.63 mg total) by nebulization every 6 (six) hours as needed for wheezing or shortness of breath. 75 mL 1  . LORazepam (ATIVAN) 0.5 MG tablet TAKE 1 OR 2 TABLETS AT BEDTIME. 60 tablet 3  . Melatonin 5 MG TABS Take 1 tablet by mouth at bedtime.     . metoprolol succinate (TOPROL-XL) 50 MG 24 hr tablet TAKE 3 TABLETS  ONCE DAILY WITH FOOD. 270 tablet 2  . Multiple Vitamins-Iron (MULTIVITAMINS WITH IRON) TABS Take 1 tablet by mouth 2 (two) times daily.     [name] nystatin ointment (MYCOSTATIN) Apply 1 application topically 2 (two) times daily as needed (IRRITATION).   0  . omeprazole (PRILOSEC) 20 MG capsule Take 2 capsules (40 mg total) by mouth daily. 60 capsule 0  . ONETOUCH DELICA LANCETS FINE MISC 1 Units by Does not apply route as directed. 100 each 3  . PARoxetine (PAXIL) 10 MG tablet TAKE 2 AND 1/2 TABS DAILY 225 tablet 3  . quinapril (ACCUPRIL) 40 MG tablet TAKE 1 TABLET ONCE DAILY. 90 tablet 3  . rOPINIRole (REQUIP XL) 2 MG 24 hr tablet TAKE ONE TABLET AT BEDTIME. 30 tablet 5  . rOPINIRole (REQUIP) 0.5 MG tablet Take 1 to 2 tab at midnight. ( XL form at 6 PM) 60 tablet 6  . rosuvastatin (CRESTOR) 10 MG tablet TAKE ONE TABLET AT BEDTIME. 90 tablet 0  . senna (SENOKOT) 8.6 MG TABS Take 1 tablet by mouth daily.     . sitaGLIPtin-metformin (JANUMET) 50-1000 MG tablet TAKE 1/2 TABLET IN THE MORNING WITH BREAKFAST AND 1 TABLET IN THE EVENING WITH SUPPER. 45 tablet 1  . XARELTO 20 MG TABS tablet TAKE 1 TABLET ONCE DAILY WITH SUPPER. 30 tablet 0  . XOPENEX HFA 45 MCG/ACT inhaler USE 1 TO 2 PUFFS EVERY 6 HOURS AS NEEDED FOR WHEEZING. 15 g 0  . benzonatate (TESSALON) 100 MG capsule Take 1 capsule (100 mg  total) by mouth 3 (three) times daily as needed for cough. 30 capsule 01  . fluticasone (FLONASE) 50 MCG/ACT nasal spray Place 2 sprays into both nostrils daily. 16 g 6   No facility-administered medications prior to visit.      Allergies:   Benadryl [diphenhydramine hcl]; Clopidogrel bisulfate; and Contrast media [iodinated diagnostic agents]   Social History   Socioeconomic History  . Marital status: Married    Spouse name: Not on file  . Number of children: 2  . Years of education: master's  . Highest education level: Not on file  Occupational History  . Occupation: Forensic scientist: [employer]  . Financial resource strain: Not on file  . Food insecurity:    Worry: Not on file    Inability: Not on file  . Transportation needs:    Medical: Not on file    Non-medical: Not on file  Tobacco Use  . Smoking status: Former Smoker    Packs/day: 1.50    Years: 30.00    Pack years: 45.00    Types: Cigarettes    Last attempt to quit: 08/13/1993    Years since quitting: 25.2  . Smokeless tobacco: Never Used  Substance and Sexual Activity  . Alcohol use: No  . Drug use: No  . Sexual activity: Not on file  Lifestyle  . Physical activity:    Days per week: Not on file    Minutes per session: Not on file  . Stress: Not on file  Relationships  . Social connections:    Talks on phone: Not on file    Gets together: Not on file    Attends religious service: Not on file    Active member of club or organization: Not on file    Attends meetings of clubs or organizations: Not on file    Relationship status: Not on file  Other Topics Concern  .  Not on file  Social History Narrative  . Not on file     Family History:  The patient's family history includes Cancer in her brother and sister; Colon cancer (age of onset: 38) in her brother; Colon polyps in her sister; Emphysema (age of onset: 71) in her mother; Gout in her brother; Heart attack (age  of onset: 14) in her father; Hyperlipidemia in her brother; Hypertension in her brother; Pneumonia in her maternal grandmother; Ulcers in her mother.   ROS:   Please see the history of present illness.    ROS All other systems reviewed and are negative.   PHYSICAL EXAM:   VS:  BP 138/68 (BP Location: Left Arm, Patient Position: Sitting, Cuff Size: Normal)   Pulse 76   Ht 5\' 2"  (1.575 m)   Wt 202 lb 12.8 oz (92 kg)   BMI 37.09 kg/m    GEN: Well nourished, well developed, in no acute distress  HEENT: normal  Neck: no JVD, carotid bruits, or masses Cardiac: Irregularly irregular; no murmurs, rubs, or gallops. 2+ LLE edema and 1+ RLE edema  Respiratory:  clear to auscultation bilaterally, normal work of breathing. Diminished breath sound in the right base GI: soft, nontender, nondistended, + BS MS: no deformity or atrophy  Skin: warm and dry, no rash Neuro:  Alert and Oriented x 3, Strength and sensation are intact Psych: euthymic mood, full affect  Wt Readings from Last 3 Encounters:  11/06/18 202 lb 12.8 oz (92 kg)  10/11/18 196 lb 8 oz (89.1 kg)  10/02/18 183 lb (83 kg)      Studies/Labs Reviewed:   EKG:  EKG is ordered today.  The ekg ordered today demonstrates atrial fibrillation, no ischemic changes  Recent Labs: 10/02/2018: ALT 12; BUN 21; Creatinine, Ser 0.73; Hemoglobin 11.5; Platelets 317.0; Potassium 4.3; Sodium 134; TSH 2.21   Lipid Panel    Component Value Date/Time   CHOL 118 10/02/2018 1203   TRIG 123.0 10/02/2018 1203   HDL 51.10 10/02/2018 1203   CHOLHDL 2 10/02/2018 1203   VLDL 24.6 10/02/2018 1203   LDLCALC 42 10/02/2018 1203   LDLDIRECT 143.4 01/23/2008 1316    Additional studies/ records that were reviewed today include:   Echo 07/13/2016 LV EF: 60% -   65% Study Conclusions  - Left ventricle: The cavity size was normal. Wall thickness was   increased in a pattern of mild LVH. Systolic function was normal.   The estimated ejection fraction  was in the range of 60% to 65%.   Incoordinate septal motion. Low normal GLPSS at -18% with mild   anteroseptal and inferoseptal strain abnormality. The study is   not technically sufficient to allow evaluation of LV diastolic   function. - Aortic valve: Sclerosis without stenosis. There was no   regurgitation. - Mitral valve: Calcified annulus. There was mild regurgitation. - Left atrium: Moderately dilated. - Right ventricle: The cavity size was mildly dilated. Systolic   function was low normal. - Right atrium: The atrium was mildly dilated. - Inferior vena cava: The vessel was normal in size. The   respirophasic diameter changes were in the normal range (= 50%),   consistent with normal central venous pressure.  Impressions:  - Compared to a prior echo in 2015, the LVEF is higher at 60-65%.   There is now moderate LAE and mild RAE, mild MR and atrial   fibrillation is noted.    ASSESSMENT:    1. Diastolic dysfunction with acute on  chronic heart failure ([place])   2. Permanent atrial fibrillation   3. Congestive heart failure, unspecified HF chronicity, unspecified heart failure type ([place])   4. Chest tightness   5. Coronary artery disease involving native coronary artery of native heart with angina pectoris ([place])   6. Essential hypertension   7. Hyperlipidemia LDL goal <70   8. Controlled type 2 diabetes mellitus without complication, without long-term current use of insulin ([place])   9. Chronic obstructive pulmonary disease, unspecified COPD type ([place])      PLAN:  In order of problems listed above:  1. Acute on chronic diastolic heart failure  -Presumed diastolic dysfunction, given the new onset of heart failure, I think it is prudent to obtain an echocardiogram in the next 2 weeks to make sure there is no drop in EF.  -Start on 20 mg daily of Lasix.  Recheck basic metabolic panel in 1 to 2 weeks  -Discussed low-sodium intake, fluid restriction and daily weight.  We will  arrange 3 to 4 weeks tele-visit to follow-up on progress.  2. Chest tightness  -Associated with the recent weight gain.  EKG was negative for ischemia.  I recommended echocardiogram as initial test, if EF is low, then we will do ischemic work-up.  3. Permanent atrial fibrillation: Rate controlled on diltiazem and Toprol-XL.  Also on Xarelto for anticoagulation.  4. CAD: Some chest tightness recently at the same time as fluid again.   5. Hypertension: Blood pressure stable  6. Hyperlipidemia: On Crestor 10 mg daily  7. DM 2: Managed by primary care provider  8. COPD: No sign of acute exacerbation.    Medication Adjustments/Labs and Tests Ordered: Current medicines are reviewed at length with the patient today.  Concerns regarding medicines are outlined above.  Medication changes, Labs and Tests ordered today are listed in the Patient Instructions below. Patient Instructions  Medication Instructions:   START LASIX 20 MG DAILY  If you need a refill on your cardiac medications before your next appointment, please call your pharmacy.   Lab work: You will need to have labs (blood work) drawn in 1-2 weeks:  BMET  If you have labs (blood work) drawn today and your tests are completely normal, you will receive your results only by: [name] MyChart Message (if you have MyChart) OR . A paper copy in the mail If you have any lab test that is abnormal or we need to change your treatment, we will call you to review the results.  Testing/Procedures:  PLEASE SCHEDULE FOR 2 WEEKS   Your physician has requested that you have an Echocardiogram. Echocardiography is a painless test that uses sound waves to create images of your heart. It provides your doctor with information about the size and shape of your heart and how well your heart's chambers and valves are working. This procedure takes approximately one hour. There are no restrictions for this procedure.    Follow-Up: At [facility], you  and your health needs are our priority.  As part of our continuing mission to provide you with exceptional heart care, we have created designated Provider Care Teams.  These Care Teams include your primary Cardiologist (physician) and Advanced Practice Providers (APPs -  Physician Assistants and Nurse Practitioners) who all work together to provide you with the care you need, when you need it. . You will need a follow up appointment in 3 months with Quay Burow, MD. . Your physician recommends that you schedule a follow-up appointment in 3-4  weeks as a Televisit with Almyra Deforest, PA-C   Any Other Special Instructions Will Be Listed Below (If Applicable). KEEP a log/diary of your daily weights  LIMIT your FLUID INTAKE to 32-64 ounces a day  LIMIT your SALT INTAKE to less than 1500  a day      Hilbert Corrigan, [place]  11/06/2018 11:28 AM    [facility] [address], [place], [place]  [address] Phone: [phone]; Fax: [phone]

## 2018-11-06 NOTE — Telephone Encounter (Signed)
Returned call to patient she stated she has gained 15 lbs since January.She is sob hard to walk much distance without being sob.Swelling in lower legs,feet and abdomen.She is having chest tightness.Appointment scheduled with Almyra Deforest PA this morning at 10:30 am.

## 2018-11-10 ENCOUNTER — Telehealth: Payer: Self-pay | Admitting: Physician Assistant

## 2018-11-10 NOTE — Telephone Encounter (Signed)
Patient was in last week to see Almyra Deforest, he prescribed her furosemide (LASIX) 20 MG tablet, she states he has not helped her at all, her left leg still has swelling, it has not changed since last week and she has not lost any weight.

## 2018-11-10 NOTE — Telephone Encounter (Signed)
Spoke with pt, she has taken the 20 mg of lasix once daily for 4 days and there has been no change in her swelling in the left or right foot and leg. She also reports abdominal distension. She denies any SOB and reports her weight has not changed. She is watching sodium and only drinking 64 oz daily. Patient instructed to increase furosemide to 40 mg daily for the next 3 days and then call the office to report. Pt agreed with this plan.

## 2018-11-11 ENCOUNTER — Telehealth: Payer: Self-pay | Admitting: Internal Medicine

## 2018-11-11 NOTE — Telephone Encounter (Signed)
Just making sure it was okay to do virtual visit for patient

## 2018-11-11 NOTE — Telephone Encounter (Signed)
Copied from Story [id number]. Topic: General - Other >> Nov 11, 2018  3:31 PM Waylan Rocher, [place] L wrote: Reason for CRM: Both legs swelling, needs appt for 04/01 per pt.  Please advise?

## 2018-11-12 ENCOUNTER — Telehealth: Payer: Self-pay | Admitting: Physician Assistant

## 2018-11-12 NOTE — Telephone Encounter (Signed)
Follow Up:    Pt said her Lasix was increased last Thursday and it is not doing what it is supposed to do. Her legs are still swollen a lot. She says can not walk 30 feet without being short of breath.

## 2018-11-12 NOTE — Telephone Encounter (Signed)
Continue to have SOB which is worse with activity, BLE, worse in left, and abdominal bloating which she reports are a little better than before visit. Patient also says she didn't void much more than normal after starting lasix. Also continues to have intermittent chest tightness that has not improved rated 9/10 and occurs mostly when moving. Says this is a problem that she's had for sometime but reports its worse than before. Denies active chest tightness. Currently taking furosemide 20 mg BID for the past 3 days.  Patient concerned that the furosemide is not working and is requesting an appointment to be seen. Advised that message would be sent to the provider for recommendations. Advised if symptoms get worse between now and time she hears back from [place], to go to the ED for an evaluation. Verbalized understanding.   11/07/2018 195.7 lbs 11/12/2018 193.1 lbs

## 2018-11-12 NOTE — Telephone Encounter (Signed)
email sent

## 2018-11-12 NOTE — Telephone Encounter (Signed)
Fine to do virtual

## 2018-11-12 NOTE — Telephone Encounter (Signed)
Virtual visit made for tomorrow, please send email

## 2018-11-13 ENCOUNTER — Ambulatory Visit: Payer: PPO | Admitting: Internal Medicine

## 2018-11-13 NOTE — Telephone Encounter (Signed)
Pt called back because she thought she was going to get another call from the nurse she spoke with yesterday, but has not heard a response yet.

## 2018-11-15 ENCOUNTER — Other Ambulatory Visit: Payer: Self-pay | Admitting: Internal Medicine

## 2018-11-18 ENCOUNTER — Telehealth: Payer: Self-pay | Admitting: Cardiology

## 2018-11-18 NOTE — Telephone Encounter (Signed)
Based on chart review I would keep this echo on the schedule for 4/9. Would ou let her know? Thank you! Ena Dawley

## 2018-11-20 ENCOUNTER — Ambulatory Visit (HOSPITAL_COMMUNITY): Payer: PPO | Attending: Cardiovascular Disease

## 2018-11-20 ENCOUNTER — Other Ambulatory Visit: Payer: Self-pay

## 2018-11-20 DIAGNOSIS — I509 Heart failure, unspecified: Secondary | ICD-10-CM | POA: Insufficient documentation

## 2018-11-20 DIAGNOSIS — N183 Chronic kidney disease, stage 3 unspecified: Secondary | ICD-10-CM | POA: Diagnosis not present

## 2018-11-20 DIAGNOSIS — I517 Cardiomegaly: Secondary | ICD-10-CM | POA: Diagnosis not present

## 2018-11-20 DIAGNOSIS — E1122 Type 2 diabetes mellitus with diabetic chronic kidney disease: Secondary | ICD-10-CM | POA: Diagnosis not present

## 2018-11-20 DIAGNOSIS — I361 Nonrheumatic tricuspid (valve) insufficiency: Secondary | ICD-10-CM | POA: Diagnosis not present

## 2018-11-20 DIAGNOSIS — R109 Unspecified abdominal pain: Secondary | ICD-10-CM | POA: Diagnosis not present

## 2018-11-20 DIAGNOSIS — Z941 Heart transplant status: Secondary | ICD-10-CM | POA: Diagnosis not present

## 2018-11-24 NOTE — Progress Notes (Signed)
The patient has been notified of the result and she verbalized some questions. She would like to know about the stiffness of her heart and what does that mean if she has normal pumping function and that the swelling has only gone down a little. She wants to know what the care plan will be and what needs to be done.

## 2018-11-25 ENCOUNTER — Ambulatory Visit: Payer: PPO | Admitting: Neurology

## 2018-11-25 ENCOUNTER — Other Ambulatory Visit: Payer: Self-pay | Admitting: Cardiovascular Disease

## 2018-11-25 ENCOUNTER — Encounter: Payer: Self-pay | Admitting: Internal Medicine

## 2018-11-25 NOTE — Telephone Encounter (Signed)
68yo, 202 lbs, Scr 0.73 on 10/02/18 Crcl 174ml/min Last OV 11/06/18

## 2018-11-26 ENCOUNTER — Telehealth: Payer: Self-pay | Admitting: General Practice

## 2018-11-26 NOTE — Telephone Encounter (Signed)
Spoke with the pt and scheduled appt  I have made her aware of office location and advised to come alone if she can

## 2018-11-26 NOTE — Telephone Encounter (Signed)
Returned call to patient regarding inquiry into appointment/new consult with Dr. Annamaria Boots re: 2 years of SOB and now 6 months of bilateral leg swelling.   -Hx COPD - using symbicort 'sometimes when needs it' but no longer uses xopenex   - only new med prescribed was Lasix - no improvement of SOB or edema per patient   PCP sent her to Dr. Berry/cardiology -had echocardiogram 11/20/18 which she says was told 'ok.'   Has not followed up with PCP since echo but Dr. Gwenlyn Found suggested she may need pulmonary workup.  Patient's sister is a patient of Dr. Annamaria Boots which is why she requested an appointment with him.  She is concerned she may have a 'blood clot' that is being overlooked.    Explained to patient we are deferring most in-office visits to July schedule due to risk of viral exposure (covid19) and stay at home order but patient thinks she needs to be seen sooner.  Will route to Dr. Annamaria Boots for recommendations.    Dr. Annamaria Boots please advise.  Thank you

## 2018-11-26 NOTE — Telephone Encounter (Signed)
Ok to make her a new consult in-person office appointment with me this week.

## 2018-11-27 ENCOUNTER — Encounter: Payer: Self-pay | Admitting: Internal Medicine

## 2018-11-27 ENCOUNTER — Ambulatory Visit: Payer: PPO | Admitting: Internal Medicine

## 2018-11-27 ENCOUNTER — Other Ambulatory Visit: Payer: Self-pay

## 2018-11-27 VITALS — BP 130/74 | HR 74 | Ht 62.0 in | Wt 197.8 lb

## 2018-11-27 DIAGNOSIS — R0602 Shortness of breath: Secondary | ICD-10-CM | POA: Diagnosis not present

## 2018-11-27 DIAGNOSIS — I809 Phlebitis and thrombophlebitis of unspecified site: Secondary | ICD-10-CM

## 2018-11-27 DIAGNOSIS — I8002 Phlebitis and thrombophlebitis of superficial vessels of left lower extremity: Secondary | ICD-10-CM | POA: Insufficient documentation

## 2018-11-27 DIAGNOSIS — I5030 Unspecified diastolic (congestive) heart failure: Secondary | ICD-10-CM | POA: Diagnosis not present

## 2018-11-27 DIAGNOSIS — J449 Chronic obstructive pulmonary disease, unspecified: Secondary | ICD-10-CM

## 2018-11-27 DIAGNOSIS — I872 Venous insufficiency (chronic) (peripheral): Secondary | ICD-10-CM | POA: Diagnosis not present

## 2018-11-27 LAB — D-DIMER, QUANTITATIVE: D-Dimer, Quant: 0.23 mcg/mL FEU (ref <0.50)

## 2018-11-27 MED ORDER — TIOTROPIUM BROMIDE-OLODATEROL 2.5-2.5 MCG/ACT IN AERS: 2.0000 | INHALATION_SPRAY | Freq: Every day | RESPIRATORY_TRACT | 0 refills | Status: DC

## 2018-11-27 NOTE — Patient Instructions (Signed)
Order- lab- D-dimer, BNP    Dx superficial phlebitis, diastolic CHF  Order- Schedule PFT when available  Sample Stiolto inhaler    Inhale 2 puffs, once daily  Try to elevate your legs when sitting, and put warm compreses on the tender nodule on your left leg to help it disolve.

## 2018-11-27 NOTE — Assessment & Plan Note (Signed)
I favor this being a small superficial phlebitis/ hematoma. Plan- Continue Xarelto, D-dimer, local heat and elevation

## 2018-11-27 NOTE — Assessment & Plan Note (Addendum)
Severity unclear. No active bronchitis symptoms. Plan- try sample Stiolto. When Covid rules permit, needs PFT. Labs for BNP, D-dimer

## 2018-11-27 NOTE — Progress Notes (Signed)
11/27/2018- 2 yoF former smoker asking consultation, concerned she might have DVT. Medical problem list includes DM2/ neuropathy, AFi b, HBP, CAD/PCI, COPD, GERD, Hyperlipidemia, Restless Legs, Obesity, Glaucoma -----having SHOB, redness bilateral lower legs, started in last 8 months, left lower leg has knot below the knee  Aware of increased dyspnea over 5-6 months with no acute event. Dr Linna Darner served as PCP and pulmonologist many years ago. Legs feel weak walking without definite claudication. Persistent ankle edema with more redness in lower legs recent months. Little cough or wheeze. In chronic AFIB/ Xarelto- failed cardioversion. In last few days has noted tender pretibial nodule distal to L patella "may have hit it on something".  Echo 11/20/2018- EF 55-60%, mild RAE CXR 12/04/17- IMPRESSION: Aortic atherosclerosis.  No edema or consolidation NPSG 08/15/2017- AHI 0/ hr. desat to 89%/ mean 93%, PLMA 23.7/ hr, body weight 194 lbs Hgb 10/02/2018- 11.5  Prior to Admission medications   Medication Sig Start Date End Date Taking? Authorizing Provider  albuterol (VENTOLIN HFA) 108 (90 Base) MCG/ACT inhaler Inhale 1-2 puffs into the lungs every 6 (six) hours as needed for wheezing or shortness of breath.   Yes [provider]  ascorbic acid (VITAMIN C) 1000 MG tablet Take 1,000 mg by mouth 2 (two) times daily.    Yes [provider]  aspirin EC 81 MG tablet Take 1 tablet (81 mg total) by mouth daily. 06/10/18  Yes Kilroy, Luke K, PA-C  budesonide-formoterol (SYMBICORT) 160-4.5 MCG/ACT inhaler USE 2 PUFFS TWICE DAILY Patient taking differently: Inhale 2 puffs into the lungs 2 (two) times daily as needed (asthma). USE 2 PUFFS TWICE DAILY AS NEEDED 10/28/17  Yes Hoyt Koch, MD  CALCIUM-MAG-VIT C-VIT D PO Take 1 tablet by mouth daily.    Yes [provider]  celecoxib (CELEBREX) 200 MG capsule Take 200 mg by mouth 2 (two) times daily.  12/30/13  Yes [provider]  Continuous Blood Gluc Sensor ([place]) MISC Use to monitor sugars 04/23/17  Yes Hoyt Koch, MD  diclofenac sodium (VOLTAREN) 1 % GEL Apply 4 times daily 08/01/18  Yes Regal, Tamala Fothergill, DPM  diltiazem (CARDIZEM CD) 180 MG 24 hr capsule TAKE 1 CAPSULE DAILY. 09/15/18  Yes Lorretta Harp, MD  furosemide (LASIX) 20 MG tablet Take 1 tablet (20 mg total) by mouth daily. 11/06/18 02/04/19 Yes Meng, Isaac Laud, PA  gabapentin (NEURONTIN) 100 MG capsule TAKE (1) CAPSULE THREE TIMES DAILY. 10/14/18  Yes Hoyt Koch, MD  guaiFENesin (MUCINEX) 600 MG 12 hr tablet Take 600 mg by mouth 2 (two) times daily.   Yes [provider]  INVOKANA 100 MG TABS tablet TAKE 1 TABLET ONCE DAILY. 09/22/18  Yes Hoyt Koch, MD  isosorbide mononitrate (IMDUR) 30 MG 24 hr tablet Take 0.5 tablets (15 mg total) by mouth daily. 04/03/18  Yes Lorretta Harp, MD  latanoprost (XALATAN) 0.005 % ophthalmic solution Place 1 drop into both eyes at bedtime. 12/09/13  Yes [provider]  LORazepam (ATIVAN) 0.5 MG tablet TAKE 1 OR 2 TABLETS AT BEDTIME. 10/14/18  Yes Hoyt Koch, MD  Melatonin 5 MG TABS Take 1 tablet by mouth at bedtime.    Yes [provider]  metoprolol succinate (TOPROL-XL) 50 MG 24 hr tablet TAKE 3 TABLETS ONCE DAILY WITH FOOD. 08/14/18  Yes Lorretta Harp, MD  nystatin ointment (MYCOSTATIN) Apply 1 application topically 2 (two) times daily as needed (IRRITATION).  07/09/16  Yes [provider]  omeprazole (PRILOSEC) 20 MG capsule Take 2 capsules (40 mg total) by mouth daily. 01/23/18  Yes Buccini, Herbie [place], MD  [facility] DELICA LANCETS FINE MISC 1 Units by Does not apply route as directed. 10/05/11  Yes Midge Minium, MD  PARoxetine (PAXIL) 10 MG tablet TAKE 2 AND 1/2 TABS DAILY 10/13/18  Yes Hoyt Koch, MD  quinapril (ACCUPRIL) 40 MG tablet TAKE 1 TABLET ONCE DAILY. 09/18/18  Yes Hoyt Koch, MD  rOPINIRole  (REQUIP XL) 2 MG 24 hr tablet TAKE ONE TABLET AT BEDTIME. 07/24/18  Yes Dohmeier, Asencion Partridge, MD  rOPINIRole (REQUIP) 0.5 MG tablet Take 1 to 2 tab at midnight. ( XL form at 6 PM) 06/30/18  Yes Dohmeier, Asencion Partridge, MD  rosuvastatin (CRESTOR) 10 MG tablet TAKE ONE TABLET AT BEDTIME. 10/21/18  Yes Lorretta Harp, MD  senna (SENOKOT) 8.6 MG TABS Take 1 tablet by mouth daily.    Yes [provider]  sitaGLIPtin-metformin (JANUMET) 50-1000 MG tablet TAKE 1/2 TABLET IN THE MORNING WITH BREAKFAST AND 1 TABLET IN THE EVENING WITH SUPPER. 11/17/18  Yes Hoyt Koch, MD  XARELTO 20 MG TABS tablet TAKE 1 TABLET ONCE DAILY WITH SUPPER. 11/25/18  Yes Lorretta Harp, MD  levalbuterol Penne Lash) 0.63 MG/3ML nebulizer solution Take 3 mLs (0.63 mg total) by nebulization every 6 (six) hours as needed for wheezing or shortness of breath. Patient not taking: Reported on 11/27/2018 07/02/16   Shawnee Knapp, MD  Multiple Vitamins-Iron (MULTIVITAMINS WITH IRON) TABS Take 1 tablet by mouth 2 (two) times daily.     [provider]  Tiotropium Bromide-Olodaterol (STIOLTO RESPIMAT) 2.5-2.5 MCG/ACT AERS Inhale 2 puffs into the lungs daily. 11/27/18   Deneise Lever, MD  XOPENEX HFA 45 MCG/ACT inhaler USE 1 TO 2 PUFFS EVERY 6 HOURS AS NEEDED FOR WHEEZING. Patient not taking: Reported on 11/27/2018 03/23/16   Hoyt Koch, MD   Past Medical History:  Diagnosis Date  . Anemia, unspecified   . Arthritis   . Asthma   . Atrial fibrillation with RVR ([place])    a. on Xarelto  . Bell's palsy    Facial nerve decompression in 2001  . Chronic low back pain   . COPD with asthma ([place])   . Coronary artery disease    Myoview 04/12/11 was entirely normal. ECHO 02/26/08 showed only minor abnormalities. Stenting 05/26/08 of her posterolateral branch to the left circumflex coronary artery. Used a 2.5x79mm Taxus Monorail stent.myoview 2014 was without ischemia  . Diabetes mellitus    Type 2  . Early cataracts,  bilateral   . Fatty liver   . GERD (gastroesophageal reflux disease)   . Glaucoma   . Glaucoma   . Goiter   . Heart murmur   . History of nuclear stress test 2012; 2014   lexiscan; normal pattern of perfusion; normal, low risk scan   . Hyperlipidemia   . Hypertension   . Panic disorder   . Pneumonia 2008  . Polycystic ovary disease    Hysterectomy in 1982 for this  . Shortness of breath dyspnea    ECHO 02/26/08 showed only minor abnormalities  . Spinal stenosis    Past Surgical History:  Procedure Laterality Date  . ABDOMINAL HYSTERECTOMY  1982   & BSO; for polycystic ovary disease  . CARDIOVERSION N/A 12/17/2013   Procedure: CARDIOVERSION;  Surgeon: Pixie Casino, MD;  Location: [facility] ENDOSCOPY;  Service: Cardiovascular;  Laterality: N/A;  . CARDIOVERSION N/A 09/30/2015  Procedure: CARDIOVERSION;  Surgeon: Dorothy Spark, MD;  Location: [facility] ENDOSCOPY;  Service: Cardiovascular;  Laterality: N/A;  . CARDIOVERSION N/A 06/22/2016   Procedure: CARDIOVERSION;  Surgeon: Skeet Latch, MD;  Location: [place];  Service: Cardiovascular;  Laterality: N/A;  . COLONOSCOPY     last 2009; Dr Cristina Gong; due 2019  . COLONOSCOPY WITH PROPOFOL N/A 01/23/2018   Procedure: COLONOSCOPY WITH PROPOFOL;  Surgeon: Ronald Lobo, MD;  Location: [place];  Service: Endoscopy;  Laterality: N/A;  . CORONARY ANGIOPLASTY  05/26/2008   Stenting of her posterolateral branch to the left circumflex coronary artery. Used a 2.5x10mm Taxus Monorail stent.  . ESOPHAGOGASTRODUODENOSCOPY (EGD) WITH PROPOFOL N/A 01/23/2018   Procedure: ESOPHAGOGASTRODUODENOSCOPY (EGD) WITH PROPOFOL;  Surgeon: Ronald Lobo, MD;  Location: [place];  Service: Endoscopy;  Laterality: N/A;  . FACIAL NERVE DECOMPRESSION  2001/2002   bells palsy   . LAPAROSCOPIC CHOLECYSTECTOMY  06/15/2011    Dr Dalbert Batman  . TEE WITHOUT CARDIOVERSION N/A 12/17/2013   Procedure: TRANSESOPHAGEAL ECHOCARDIOGRAM (TEE);  Surgeon: Pixie Casino,  MD;  Location: [facility] ENDOSCOPY;  Service: Cardiovascular;  Laterality: N/A;  trish/ja  . TRANSTHORACIC ECHOCARDIOGRAM  05/06/2682   LV systolic function normal with mild conc LVH; LA mildly dilated; trace MR/TR  . UPPER GI ENDOSCOPY  2009   negative   Family History  Problem Relation Age of Onset  . Heart attack Father 38       2nd MI at 19  . Colon cancer Brother 7  . Gout Brother   . Ulcers Mother   . Emphysema Mother 53  . Colon polyps Sister   . Cancer Sister        Basal cell carcinoma  . Pneumonia Maternal Grandmother   . Hypertension Brother   . Hyperlipidemia Brother   . Cancer Brother        Skin  . Diabetes Neg Hx   . Stroke Neg Hx    Social History   Socioeconomic History  . Marital status: Married    Spouse name: Not on file  . Number of children: 2  . Years of education: master's  . Highest education level: Not on file  Occupational History  . Occupation: Forensic scientist: [employer]  . Financial resource strain: Not on file  . Food insecurity:    Worry: Not on file    Inability: Not on file  . Transportation needs:    Medical: Not on file    Non-medical: Not on file  Tobacco Use  . Smoking status: Former Smoker    Packs/day: 1.50    Years: 30.00    Pack years: 45.00    Types: Cigarettes    Last attempt to quit: 08/13/1993    Years since quitting: 25.3  . Smokeless tobacco: Never Used  Substance and Sexual Activity  . Alcohol use: No  . Drug use: No  . Sexual activity: Not on file  Lifestyle  . Physical activity:    Days per week: Not on file    Minutes per session: Not on file  . Stress: Not on file  Relationships  . Social connections:    Talks on phone: Not on file    Gets together: Not on file    Attends religious service: Not on file    Active member of club or organization: Not on file    Attends meetings of clubs or organizations: Not on file    Relationship status: Not  on file  . Intimate  partner violence:    Fear of current or ex partner: Not on file    Emotionally abused: Not on file    Physically abused: Not on file    Forced sexual activity: Not on file  Other Topics Concern  . Not on file  Social History Narrative  . Not on file   ROS-see HPI   + = positive Constitutional:    weight loss+ last year, night sweats, fevers, chills, fatigue, lassitude. HEENT:    headaches, difficulty swallowing, tooth/dental problems, sore throat,       sneezing, itching, ear ache, nasal congestion, post nasal drip, snoring CV:    chest pain, orthopnea, PND, +swelling in lower extremities, anasarca,                                  dizziness, palpitations Resp:   +shortness of breath with exertion or at rest.                productive cough,   non-productive cough, coughing up of blood.              change in color of mucus.  wheezing.   Skin:    rash or lesions. GI:  No-   heartburn, indigestion, abdominal pain, nausea, vomiting, diarrhea,                 change in bowel habits, loss of appetite GU: dysuria, change in color of urine, no urgency or frequency.   flank pain. MS:   joint pain, stiffness, decreased range of motion, back pain. Neuro-     nothing unusual Psych:  change in mood or affect.  depression or anxiety.   memory loss.  OBJ- Physical Exam General- Alert, Oriented, Affect-appropriate, Distress- none acute, obese+ Skin- rash-none, lesions- none, excoriation- none Lymphadenopathy- none Head- atraumatic            Eyes- Gross vision intact, PERRLA, conjunctivae and secretions clear            Ears- Hearing, canals-normal            Nose- Clear, no-Septal dev, mucus, polyps, erosion, perforation             Throat- Mallampati II , mucosa clear , drainage- none, tonsils- atrophic Neck- flexible , trachea midline, no stridor , thyroid nl, carotid no bruit Chest - symmetrical excursion , unlabored           Heart/CV- IRR/AFib+ , no murmur , no gallop  , no rub, nl s1  s2                           - JVD- none , edema+1-2, stasis changes+, varices-+ superficial There is a tender 1 cm diam nodule related to a superficial varix at L knee, c/w small hematoma.           Lung- clear to P&A, wheeze- none, cough- none , dullness-none, rub- none           Chest wall-  Abd-  Br/ Gen/ Rectal- Not done, not indicated Extrem- cyanosis- none, clubbing, none, atrophy- none, strength- nl Neuro- grossly intact to observation

## 2018-11-27 NOTE — Assessment & Plan Note (Addendum)
Encourage elevation when sitting. Consider support hose.

## 2018-11-28 ENCOUNTER — Telehealth: Payer: PPO | Admitting: Physician Assistant

## 2018-11-28 ENCOUNTER — Telehealth (INDEPENDENT_AMBULATORY_CARE_PROVIDER_SITE_OTHER): Payer: PPO | Admitting: Physician Assistant

## 2018-11-28 ENCOUNTER — Encounter: Payer: Self-pay | Admitting: Physician Assistant

## 2018-11-28 VITALS — BP 120/59 | HR 80

## 2018-11-28 DIAGNOSIS — Z7901 Long term (current) use of anticoagulants: Secondary | ICD-10-CM | POA: Diagnosis not present

## 2018-11-28 DIAGNOSIS — E785 Hyperlipidemia, unspecified: Secondary | ICD-10-CM | POA: Diagnosis not present

## 2018-11-28 DIAGNOSIS — I10 Essential (primary) hypertension: Secondary | ICD-10-CM | POA: Diagnosis not present

## 2018-11-28 DIAGNOSIS — E119 Type 2 diabetes mellitus without complications: Secondary | ICD-10-CM | POA: Diagnosis not present

## 2018-11-28 DIAGNOSIS — I4821 Permanent atrial fibrillation: Secondary | ICD-10-CM | POA: Diagnosis not present

## 2018-11-28 DIAGNOSIS — M7989 Other specified soft tissue disorders: Secondary | ICD-10-CM

## 2018-11-28 DIAGNOSIS — J449 Chronic obstructive pulmonary disease, unspecified: Secondary | ICD-10-CM | POA: Diagnosis not present

## 2018-11-28 DIAGNOSIS — I2510 Atherosclerotic heart disease of native coronary artery without angina pectoris: Secondary | ICD-10-CM

## 2018-11-28 LAB — BRAIN NATRIURETIC PEPTIDE: Pro B Natriuretic peptide (BNP): 219 pg/mL — ABNORMAL HIGH (ref 0.0–100.0)

## 2018-11-28 NOTE — Patient Instructions (Signed)
Medication Instructions:   INCREASE Lasix to 40 Mg 2 times a day then take 60 Mg daily  If you need a refill on your cardiac medications before your next appointment, please call your pharmacy.   Lab work: You will need to come into our office next week for labs (blood work): BMET  If you have labs (blood work) drawn today and your tests are completely normal, you will receive your results only by: [name] MyChart Message (if you have MyChart) OR . A paper copy in the mail If you have any lab test that is abnormal or we need to change your treatment, we will call you to review the results.  Testing/Procedures:  NONE ordered at this time of appointment   Follow-Up: At [facility], you and your health needs are our priority.  As part of our continuing mission to provide you with exceptional heart care, we have created designated Provider Care Teams.  These Care Teams include your primary Cardiologist (physician) and Advanced Practice Providers (APPs -  Physician Assistants and Nurse Practitioners) who all work together to provide you with the care you need, when you need it. You will need a follow up E-visit appointment in 1 months with Janice Burow, MD   Any Other Special Instructions Will Be Listed Below (If Applicable).

## 2018-11-28 NOTE — Telephone Encounter (Signed)
F/U Message            Patient is needing the instructions for the Virtual visit. Patient states she has spoken to someone and requested this 2 days ago and no one gave information. Patient appointment is today at 12:00. Pls call when downloaded. Thank you.  [phone]

## 2018-11-28 NOTE — Telephone Encounter (Signed)
No Message Needed

## 2018-11-28 NOTE — Progress Notes (Signed)
Left detailed msg on VM ok per [facility]

## 2018-11-28 NOTE — Progress Notes (Signed)
Virtual Visit via Video Note   This visit type was conducted due to national recommendations for restrictions regarding the COVID-19 Pandemic (e.g. social distancing) in an effort to limit this patient's exposure and mitigate transmission in our community.  Due to her co-morbid illnesses, this patient is at least at moderate risk for complications without adequate follow up.  This format is felt to be most appropriate for this patient at this time.  All issues noted in this document were discussed and addressed.  A limited physical exam was performed with this format.  Please refer to the patient's chart for her consent to telehealth for [facility].   Evaluation Performed:  Follow-up visit  Date:  11/30/2018   ID:  [name], DOB 1950-12-03, MRN [id number]  Patient Location: Home Provider Location: Home  PCP:  Hoyt Koch, MD  Cardiologist:  Quay Burow, MD  Electrophysiologist:  None   Chief Complaint:  LE edema  History of Present Illness:    [name] is a 68 y.o. female with past medical history of CAD, permanent atrial fibrillation, DM II, HTN, HLD and COPD.  She is a former patient of Dr. Melvern Banker.  Dr. Melvern Banker stented her distal left circumflex posterolateral branch in October 2009, there was some residual disease in the PDA branch.  EF was normal.  Due to her recurrent chest discomfort, cardiac catheterization was performed in September 2012 revealing a patent stent, 50% distal PDA lesion and normal EF.  Myoview in August 2016 was low risk.  Last echocardiogram obtained on 07/13/2016 showed EF 60 to 65%, mild MR.  Her last office visit was with Kerin Ransom on 06/10/2018, at which time she was complaining of elevated blood pressure.  I last saw the patient for increasing lower extremity edema and weight gain of 20 pounds.  I suspected patient likely has a component of diastolic heart failure.  Echocardiogram showed normal EF, diastolic dysfunction could not be  determined due to A. fib.  Patient also likely has a component of the venous insufficiency as well.  After taking 40 mg daily of Lasix, she did lose about 5 pounds.  Her lower extremity pitting edema is getting better however not completely resolved.  She did not obtain the basic metabolic panel I ordered last time.  Given persistent symptom, I will increase Lasix to 40 mg twice daily for 2 days before titrating it down to the 60 mg daily.  She will need basic metabolic panel either next Monday or Tuesday.  The patient does not have symptoms concerning for COVID-19 infection (fever, chills, cough, or new shortness of breath).    Past Medical History:  Diagnosis Date  . Anemia, unspecified   . Arthritis   . Asthma   . Atrial fibrillation with RVR ([place])    a. on Xarelto  . Bell's palsy    Facial nerve decompression in 2001  . Chronic low back pain   . COPD with asthma ([place])   . Coronary artery disease    Myoview 04/12/11 was entirely normal. ECHO 02/26/08 showed only minor abnormalities. Stenting 05/26/08 of her posterolateral branch to the left circumflex coronary artery. Used a 2.5x38mm Taxus Monorail stent.myoview 2014 was without ischemia  . Diabetes mellitus    Type 2  . Early cataracts, bilateral   . Fatty liver   . GERD (gastroesophageal reflux disease)   . Glaucoma   . Glaucoma   . Goiter   . Heart murmur   . History  of nuclear stress test 2012; 2014   lexiscan; normal pattern of perfusion; normal, low risk scan   . Hyperlipidemia   . Hypertension   . Panic disorder   . Pneumonia 2008  . Polycystic ovary disease    Hysterectomy in 1982 for this  . Shortness of breath dyspnea    ECHO 02/26/08 showed only minor abnormalities  . Spinal stenosis    Past Surgical History:  Procedure Laterality Date  . ABDOMINAL HYSTERECTOMY  1982   & BSO; for polycystic ovary disease  . CARDIOVERSION N/A 12/17/2013   Procedure: CARDIOVERSION;  Surgeon: Pixie Casino, MD;  Location: [facility]  ENDOSCOPY;  Service: Cardiovascular;  Laterality: N/A;  . CARDIOVERSION N/A 09/30/2015   Procedure: CARDIOVERSION;  Surgeon: Dorothy Spark, MD;  Location: [facility] ENDOSCOPY;  Service: Cardiovascular;  Laterality: N/A;  . CARDIOVERSION N/A 06/22/2016   Procedure: CARDIOVERSION;  Surgeon: Skeet Latch, MD;  Location: [place];  Service: Cardiovascular;  Laterality: N/A;  . COLONOSCOPY     last 2009; Dr Cristina Gong; due 2019  . COLONOSCOPY WITH PROPOFOL N/A 01/23/2018   Procedure: COLONOSCOPY WITH PROPOFOL;  Surgeon: Ronald Lobo, MD;  Location: [place];  Service: Endoscopy;  Laterality: N/A;  . CORONARY ANGIOPLASTY  05/26/2008   Stenting of her posterolateral branch to the left circumflex coronary artery. Used a 2.5x39mm Taxus Monorail stent.  . ESOPHAGOGASTRODUODENOSCOPY (EGD) WITH PROPOFOL N/A 01/23/2018   Procedure: ESOPHAGOGASTRODUODENOSCOPY (EGD) WITH PROPOFOL;  Surgeon: Ronald Lobo, MD;  Location: [place];  Service: Endoscopy;  Laterality: N/A;  . FACIAL NERVE DECOMPRESSION  2001/2002   bells palsy   . LAPAROSCOPIC CHOLECYSTECTOMY  06/15/2011    Dr Dalbert Batman  . TEE WITHOUT CARDIOVERSION N/A 12/17/2013   Procedure: TRANSESOPHAGEAL ECHOCARDIOGRAM (TEE);  Surgeon: Pixie Casino, MD;  Location: [facility] ENDOSCOPY;  Service: Cardiovascular;  Laterality: N/A;  trish/ja  . TRANSTHORACIC ECHOCARDIOGRAM  09/14/5425   LV systolic function normal with mild conc LVH; LA mildly dilated; trace MR/TR  . UPPER GI ENDOSCOPY  2009   negative     Current Meds  Medication Sig  . ascorbic acid (VITAMIN C) 1000 MG tablet Take 1,000 mg by mouth 2 (two) times daily.   [name] aspirin EC 81 MG tablet Take 1 tablet (81 mg total) by mouth daily. (Patient taking differently: Take 325 mg by mouth daily. )  . budesonide-formoterol (SYMBICORT) 160-4.5 MCG/ACT inhaler USE 2 PUFFS TWICE DAILY (Patient taking differently: Inhale 2 puffs into the lungs 2 (two) times daily as needed (asthma). USE 2 PUFFS TWICE DAILY  AS NEEDED)  . CALCIUM-MAG-VIT C-VIT D PO Take 1 tablet by mouth daily.   . celecoxib (CELEBREX) 200 MG capsule Take 200 mg by mouth 2 (two) times daily.   . Continuous Blood Gluc Sensor (FREESTYLE LIBRE SENSOR SYSTEM) MISC Use to monitor sugars  . diclofenac sodium (VOLTAREN) 1 % GEL Apply 4 times daily (Patient taking differently: as needed. Apply 4 times daily)  . diltiazem (CARDIZEM CD) 180 MG 24 hr capsule TAKE 1 CAPSULE DAILY.  . furosemide (LASIX) 20 MG tablet Take 1 tablet (20 mg total) by mouth daily. (Patient taking differently: Take 40 mg by mouth daily. )  . gabapentin (NEURONTIN) 100 MG capsule TAKE (1) CAPSULE THREE TIMES DAILY.  [name] INVOKANA 100 MG TABS tablet TAKE 1 TABLET ONCE DAILY.  . isosorbide mononitrate (IMDUR) 30 MG 24 hr tablet Take 0.5 tablets (15 mg total) by mouth daily.  [name] latanoprost (XALATAN) 0.005 % ophthalmic solution Place 1 drop into both  eyes at bedtime.  [name] LORazepam (ATIVAN) 0.5 MG tablet TAKE 1 OR 2 TABLETS AT BEDTIME.  . metoprolol succinate (TOPROL-XL) 50 MG 24 hr tablet TAKE 3 TABLETS ONCE DAILY WITH FOOD.  . Multiple Vitamins-Iron (MULTIVITAMINS WITH IRON) TABS Take 1 tablet by mouth daily.   [name] nystatin ointment (MYCOSTATIN) Apply 1 application topically 2 (two) times daily as needed (IRRITATION).   [name] omeprazole (PRILOSEC) 20 MG capsule Take 2 capsules (40 mg total) by mouth daily.  Glory Rosebush DELICA LANCETS FINE MISC 1 Units by Does not apply route as directed.  [name] PARoxetine (PAXIL) 10 MG tablet TAKE 2 AND 1/2 TABS DAILY  . quinapril (ACCUPRIL) 40 MG tablet TAKE 1 TABLET ONCE DAILY.  [name] rOPINIRole (REQUIP XL) 2 MG 24 hr tablet TAKE ONE TABLET AT BEDTIME.  [name] rOPINIRole (REQUIP) 0.5 MG tablet Take 1 to 2 tab at midnight. ( XL form at 6 PM)  . rosuvastatin (CRESTOR) 10 MG tablet TAKE ONE TABLET AT BEDTIME.  [name] senna (SENOKOT) 8.6 MG TABS Take 1 tablet by mouth daily.   . sitaGLIPtin-metformin (JANUMET) 50-1000 MG tablet TAKE 1/2 TABLET IN THE MORNING WITH  BREAKFAST AND 1 TABLET IN THE EVENING WITH SUPPER.  . Tiotropium Bromide-Olodaterol (STIOLTO RESPIMAT) 2.5-2.5 MCG/ACT AERS Inhale 2 puffs into the lungs daily.  Alveda Reasons 20 MG TABS tablet TAKE 1 TABLET ONCE DAILY WITH SUPPER.     Allergies:   Benadryl [diphenhydramine hcl]; Clopidogrel bisulfate; and Contrast media [iodinated diagnostic agents]   Social History   Tobacco Use  . Smoking status: Former Smoker    Packs/day: 1.50    Years: 30.00    Pack years: 45.00    Types: Cigarettes    Last attempt to quit: 08/13/1993    Years since quitting: 25.3  . Smokeless tobacco: Never Used  Substance Use Topics  . Alcohol use: No  . Drug use: No     Family Hx: The patient's family history includes Cancer in her brother and sister; Colon cancer (age of onset: 38) in her brother; Colon polyps in her sister; Emphysema (age of onset: 21) in her mother; Gout in her brother; Heart attack (age of onset: 45) in her father; Hyperlipidemia in her brother; Hypertension in her brother; Pneumonia in her maternal grandmother; Ulcers in her mother. There is no history of Diabetes or Stroke.  ROS:   Please see the history of present illness.     All other systems reviewed and are negative.   Prior CV studies:   The following studies were reviewed today:  Echo 11/20/2018 1. The left ventricle has normal systolic function, with an ejection fraction of 55-60%. The cavity size was normal. Left ventricular diastolic Doppler parameters are indeterminate.  2. The right ventricle has normal systolic function. The cavity was normal. There is no increase in right ventricular wall thickness.  3. Left atrial size was mildly dilated.  4. Right atrial size was mildly dilated.  5. Mild thickening of the mitral valve leaflet. Mild calcification of the mitral valve leaflet.  6. The aortic valve is tricuspid. Moderate thickening of the aortic valve. Sclerosis without any evidence of stenosis of the aortic valve. Aortic  valve regurgitation is trivial by color flow Doppler.  Labs/Other Tests and Data Reviewed:    EKG:  An ECG dated 11/06/2018 was personally reviewed today and demonstrated:  Atrial fibrillation.  Recent Labs: 10/02/2018: ALT 12; BUN 21; Creatinine, Ser 0.73; Hemoglobin 11.5; Platelets 317.0; Potassium 4.3; Sodium 134; TSH 2.21 11/27/2018:  Pro B Natriuretic peptide (BNP) 219.0   Recent Lipid Panel Lab Results  Component Value Date/Time   CHOL 118 10/02/2018 12:03 PM   TRIG 123.0 10/02/2018 12:03 PM   HDL 51.10 10/02/2018 12:03 PM   CHOLHDL 2 10/02/2018 12:03 PM   LDLCALC 42 10/02/2018 12:03 PM   LDLDIRECT 143.4 01/23/2008 01:16 PM    Wt Readings from Last 3 Encounters:  11/27/18 197 lb 12.8 oz (89.7 kg)  11/06/18 202 lb 12.8 oz (92 kg)  10/11/18 196 lb 8 oz (89.1 kg)     Objective:    Vital Signs:  BP (!) 120/59   Pulse 80    VITAL SIGNS:  reviewed  ASSESSMENT & PLAN:    1. Lower extremity edema: I suspect she has a component of lower extremity venous insufficiency.  She continued to have pitting edema in bilateral lower extremity.  During the last office visit, I increased her Lasix to 40 mg daily however she did not have basic metabolic panel.  She says she lost about 5 pounds her lower extremity edema has improved however still present.  I will increase the Lasix to 40 mg twice daily for 2 days before decreasing the dose to 60 mg daily thereafter.  I plan to obtain a basic metabolic panel either on Monday or Tuesday.  2. CAD: Denies any chest discomfort.  On aspirin  3. Permanent atrial fibrillation: Permanent atrial fibrillation on Xarelto.  Rate controlled on metoprolol  4. Hypertension: Blood pressure stable on current therapy  5. Hyperlipidemia: On Crestor 10 mg daily  6. DM 2: Managed by primary care provider  7. COPD: No acute recurrence  COVID-19 Education: The signs and symptoms of COVID-19 were discussed with the patient and how to seek care for testing  (follow up with PCP or arrange E-visit).  The importance of social distancing was discussed today.  Time:   Today, I have spent 16 minutes with the patient with telehealth technology discussing the above problems.     Medication Adjustments/Labs and Tests Ordered: Current medicines are reviewed at length with the patient today.  Concerns regarding medicines are outlined above.   Tests Ordered: Orders Placed This Encounter  Procedures  . Basic metabolic panel    Medication Changes: No orders of the defined types were placed in this encounter.   Disposition:  Follow up in 1 month(s)  Signed, Almyra Deforest, [place]  11/30/2018 11:30 PM    [facility]

## 2018-11-29 ENCOUNTER — Other Ambulatory Visit (HOSPITAL_COMMUNITY): Payer: Self-pay | Admitting: Nurse Practitioner

## 2018-12-01 ENCOUNTER — Telehealth: Payer: Self-pay | Admitting: Internal Medicine

## 2018-12-01 ENCOUNTER — Other Ambulatory Visit (HOSPITAL_COMMUNITY): Payer: Self-pay | Admitting: Nurse Practitioner

## 2018-12-01 MED ORDER — TIOTROPIUM BROMIDE-OLODATEROL 2.5-2.5 MCG/ACT IN AERS: 2.0000 | INHALATION_SPRAY | Freq: Every day | RESPIRATORY_TRACT | 1 refills | Status: DC

## 2018-12-01 NOTE — Telephone Encounter (Signed)
Toprol 25 mg refilled.

## 2018-12-01 NOTE — Telephone Encounter (Signed)
Pt had successful virtual visit with Almyra Deforest, PA on 4/17

## 2018-12-01 NOTE — Telephone Encounter (Signed)
Pt aware of lab results. She says her cardiologist has her on lasix for fluid retention. She is currently on 80 mgs. Also Stiolto inhaler is working. Pt would like rx sent to pharmacy. Rx sent.

## 2018-12-02 ENCOUNTER — Telehealth: Payer: Self-pay

## 2018-12-02 DIAGNOSIS — M7989 Other specified soft tissue disorders: Secondary | ICD-10-CM | POA: Diagnosis not present

## 2018-12-02 NOTE — Telephone Encounter (Signed)
PA submitted for patient via cover my meds.   Key: [id number]  Will send message to nurse to hold until a determination is made.

## 2018-12-02 NOTE — Telephone Encounter (Signed)
Noted. Will keep encounter open to f/u on PA.

## 2018-12-03 LAB — BASIC METABOLIC PANEL
BUN/Creatinine Ratio: 25 (ref 12–28)
BUN: 17 mg/dL (ref 8–27)
CO2: 22 mmol/L (ref 20–29)
Calcium: 9.3 mg/dL (ref 8.7–10.3)
Chloride: 87 mmol/L — ABNORMAL LOW (ref 96–106)
Creatinine, Ser: 0.69 mg/dL (ref 0.57–1.00)
GFR calc Af Amer: 102 mL/min/{1.73_m2} (ref >59)
GFR calc non Af Amer: 88 mL/min/{1.73_m2} (ref >59)
Glucose: 179 mg/dL — ABNORMAL HIGH (ref 65–99)
Potassium: 4.6 mmol/L (ref 3.5–5.2)
Sodium: 124 mmol/L — ABNORMAL LOW (ref 134–144)

## 2018-12-04 NOTE — Telephone Encounter (Signed)
I received a fax from [facility] stating the PA was cancelled due to the patient not being in their records. They are unable to locate her in the system. She has [employer] advantage for her insurance.

## 2018-12-08 ENCOUNTER — Telehealth: Payer: Self-pay

## 2018-12-08 NOTE — Telephone Encounter (Signed)
Triage, this is a Pharmacist, community message that was sent this AM. I am unable to address in [place]. Please address and send response via mychart message. Thanks.   Dr Annamaria Boots [facility] tells me you must send an approval to my insurance company before they can fill a prescription for the new medication. Thank you for handling this request as quickly as possible,   Additionally, the bump on left let is no better. What might be the next steps to address this? I have sent a note to [place] [facility] [place] for Dr Gwenlyn Found about the lack of improvement in both legs and severe difficulty breathing when doing even small tasks. Is this heart failure?  It was a pleasure meeting you and I do look forward to working together to improve my breathing and over-all health.    Sheldon Silvan

## 2018-12-08 NOTE — Telephone Encounter (Signed)
Attempted to do PA on covermymeds. Could not bring patient up for some reason. Called patient"s insurance and completed PA over the phone with [place] clinical phamacist. Stiolto has been approved effective today through 08/13/19. Left pt a message letting her know it should be ready for pick-up this afternoon at her pharmacist. FYI:Dr Young Pt also sent my chart message to her cardiologist PX:[id number]

## 2018-12-09 ENCOUNTER — Telehealth: Payer: Self-pay | Admitting: Cardiovascular Disease

## 2018-12-09 DIAGNOSIS — I5033 Acute on chronic diastolic (congestive) heart failure: Secondary | ICD-10-CM

## 2018-12-09 DIAGNOSIS — M7989 Other specified soft tissue disorders: Secondary | ICD-10-CM

## 2018-12-09 DIAGNOSIS — Z79899 Other long term (current) drug therapy: Secondary | ICD-10-CM

## 2018-12-09 NOTE — Telephone Encounter (Signed)
Spoke with patient at great length regarding her bilateral lower extremity edema.  She had been taking Furosemide 20 mg daily until recently when she saw Almyra Deforest.  She was instructed to increase her Furosemide to 40 mg bid x 2 days then decrease to 60 mg daily.  She is calling today b/c she feels as though despite the increase in diuretic her edema is now worse.  She is reporting it is from her feet up to her knee with the left leg being a little worse than the right.  She also is c/o abdominal cramps and diarrhea with increased Furosemide and wants to decrease her dose back to 40 mg daily.  She c/o frequent dizziness as well though she has not checked her blood pressure while feeling dizzy.  Not sure she is weighing daily as she reports she may have lost 2 lbs but if she did, it came back.  Advised pt she must decrease the NA+ in her diet as much as possible, to elevate feet and legs above the level of her heart during the day as much as possible, to weigh daily and report an increase of 2 lbs over night or 5 lbs in a week.  Advised to check blood pressures when having dizzy spells.  Strongly encouraged her to purchase and wear knee high compression stockings daily (20-30 mm/hg if possible).  She complains it makes her veins hurt to wear them but she will try.  Discussed her going to be fitted at [facility] in [place], [place] ([phone] phone #)  however they are not open to the public at this time. She states she is very worried about herself because in a 6 week period of time between January to February her weight increased 20 lbs and she is continually short of breath.  She did recently see pulmonary and was RXed a new medication but they are unable to do PFTs at this time.  She is asking if there is possibly another medication that would help her with the fluid retention without causing abdominal cramping or if there are any other tests on her legs she needs done.  Advised I will forward this  information to Almyra Deforest and Dr Gwenlyn Found for review and she will be c/b with further instructions.  Pt was grateful for the call, information and time taken with her.

## 2018-12-09 NOTE — Telephone Encounter (Signed)
Stiolto has been approved. Nothing further is needed at this time.

## 2018-12-09 NOTE — Telephone Encounter (Signed)
New message   Pt c/o medication issue:  1. Name of Medication: lasix  2. How are you currently taking this medication (dosage and times per day)? Takes 2 days of 80 mg   3. Are you having a reaction (difficulty breathing--STAT)? No  4. What is your medication issue? Patient states that her swelling is worse and needs to know about continuing to take this medication.

## 2018-12-10 ENCOUNTER — Other Ambulatory Visit: Payer: Self-pay

## 2018-12-10 MED ORDER — TORSEMIDE 20 MG PO TABS: 20.0000 mg | ORAL_TABLET | Freq: Every day | ORAL | 3 refills | Status: DC

## 2018-12-10 NOTE — Telephone Encounter (Signed)
Pt says she is on the phone with her Daughter's ENT and she will call us back.

## 2018-12-10 NOTE — Telephone Encounter (Signed)
Thanks for talking to [name].  As far as I know, she is had normal LV function.  She has had some left lower extremity swelling in the past and is been on oral anticoagulation for A. fib I believe making the possibility DVT low.  I am not sure why she is swelling all of a sudden.  She may be intolerant of furosemide.  May be changed to torsemide 20 mg a day and see if this works.  Reinforced low-sodium diet as you did.  Check basic metabolic panel in 7 to 10 days.  Probably should have a APP to a telemedicine visit on her in the next 2 to 3 weeks.  [name] saw her in October.

## 2018-12-10 NOTE — Progress Notes (Signed)
The patient has been notified of the result and verbalized understanding.  All questions (if any) were answered. Jacqulynn Cadet, [place] 12/10/2018 12:19 PM

## 2018-12-10 NOTE — Telephone Encounter (Signed)
Follow Up    Pt is calling to follow up and is awaiting a phone call     Please call

## 2018-12-10 NOTE — Telephone Encounter (Signed)
° °  Please call patient, home number

## 2018-12-10 NOTE — Telephone Encounter (Signed)
Spoke with [name] who is aware of Dr Kennon Holter recommendations/orders.  She is agreeable to try Torsemide 20 mg daily, have blood work in 7 to 10 days and follow up with Almyra Deforest (recently seen by) in 2 to 3 weeks via virtual visit.   [name] is also today c/o feeling tired, weak, dizzy.  When she took her blood pressure earlier it was 105/45 HR 44 bpm.  In review of her medications she reports she takes Metoprolol 50 mg (3) tablets every AM.  Advised [name] according to our medication list she is to be taking 25 mg at bedtime (most recently filled by Rushie Goltz, RN and cosigned by Butch Penny 12/02/2018).  She reports she has been taking (3) 50 mg tablets for years as ordered by Roderic Palau in the [facility] clinic.  In review of last note (04/01/2018) in At [facility] clinic, it does state 1. Permanent  afib Previous successful cardioversion 06/22/16 but ERAF Today she has slow v response Discussed lowering BB but she feels so well for now she does not want to change anything She will chck her HR/BP at home for the next week and will let me know numbers If HR consistently  in the 40's, low 50's, I would lower BB to 100 mg daily from 150 mg daily  There are previous prescriptions for Metoprolol 25 mg however they are all from before the 04/01/2018 office visit with Roderic Palau.   I have not been able to locate any documentation of a decrease in [name]'s Metoprolol dose other than the refill sent into the pharmacy 12/02/2018.   I have advised [name] to not take any more Metoprolol today.  Advised her to check her HR and BP in the morning before her AM dose and if BP/HR remain low and she is not feeling well to hold the medication and call the office.  She states understanding.  She also states when she does take Metoprolol again she is only going to take (2) 50 mg tablets as suggested in Mrs Carroll's last office visit note.    I will forward this information to both Roderic Palau and Dr Gwenlyn Found for their knowledge and any new orders.

## 2018-12-14 ENCOUNTER — Other Ambulatory Visit: Payer: Self-pay | Admitting: Cardiovascular Disease

## 2018-12-15 ENCOUNTER — Encounter: Payer: Self-pay | Admitting: Internal Medicine

## 2018-12-22 ENCOUNTER — Telehealth: Payer: Self-pay | Admitting: Cardiovascular Disease

## 2018-12-22 DIAGNOSIS — Z79899 Other long term (current) drug therapy: Secondary | ICD-10-CM | POA: Diagnosis not present

## 2018-12-22 DIAGNOSIS — M7989 Other specified soft tissue disorders: Secondary | ICD-10-CM | POA: Diagnosis not present

## 2018-12-22 DIAGNOSIS — I5033 Acute on chronic diastolic (congestive) heart failure: Secondary | ICD-10-CM | POA: Diagnosis not present

## 2018-12-22 NOTE — Telephone Encounter (Signed)
Pt probably needs to be seen by the DOD one day this week to eval swelling and SOB

## 2018-12-22 NOTE — Telephone Encounter (Signed)
New Message   Patient is calling because she spoke with a nurse back on 4/28 and 4/29 about her lasix and swelling in her leg. She is coming to the office today for some lab work around 2:15pm and wants to know if someone can look at her leg because its not any better. Please call.

## 2018-12-22 NOTE — Telephone Encounter (Signed)
Spoke with pt who state since starting the torsemide her leg swelling has not decreased and feel it's worsen. She report swelling was originally in left leg but now in right as well. Pt report also report still having some SOB but it hasn't worsen. She also states her urine output is no different than before and she's only lost 1 lbs.  Will route to MD for recommendations.

## 2018-12-23 ENCOUNTER — Telehealth: Payer: Self-pay | Admitting: Physician Assistant

## 2018-12-23 LAB — BASIC METABOLIC PANEL
BUN/Creatinine Ratio: 22 (ref 12–28)
BUN: 18 mg/dL (ref 8–27)
CO2: 24 mmol/L (ref 20–29)
Calcium: 8.7 mg/dL (ref 8.7–10.3)
Chloride: 86 mmol/L — ABNORMAL LOW (ref 96–106)
Creatinine, Ser: 0.81 mg/dL (ref 0.57–1.00)
GFR calc Af Amer: 85 mL/min/{1.73_m2} (ref >59)
GFR calc non Af Amer: 74 mL/min/{1.73_m2} (ref >59)
Glucose: 166 mg/dL — ABNORMAL HIGH (ref 65–99)
Potassium: 4.5 mmol/L (ref 3.5–5.2)
Sodium: 123 mmol/L — ABNORMAL LOW (ref 134–144)

## 2018-12-23 NOTE — Telephone Encounter (Signed)
Mychart, no smartphone, consent, pre reg complete 12/23/18 AF

## 2018-12-25 ENCOUNTER — Telehealth (INDEPENDENT_AMBULATORY_CARE_PROVIDER_SITE_OTHER): Payer: PPO | Admitting: Physician Assistant

## 2018-12-25 ENCOUNTER — Telehealth: Payer: Self-pay

## 2018-12-25 ENCOUNTER — Telehealth: Payer: Self-pay | Admitting: Physician Assistant

## 2018-12-25 ENCOUNTER — Encounter: Payer: Self-pay | Admitting: Physician Assistant

## 2018-12-25 DIAGNOSIS — R0600 Dyspnea, unspecified: Secondary | ICD-10-CM

## 2018-12-25 DIAGNOSIS — R600 Localized edema: Secondary | ICD-10-CM

## 2018-12-25 DIAGNOSIS — I2510 Atherosclerotic heart disease of native coronary artery without angina pectoris: Secondary | ICD-10-CM

## 2018-12-25 DIAGNOSIS — E871 Hypo-osmolality and hyponatremia: Secondary | ICD-10-CM

## 2018-12-25 DIAGNOSIS — I10 Essential (primary) hypertension: Secondary | ICD-10-CM

## 2018-12-25 DIAGNOSIS — I4821 Permanent atrial fibrillation: Secondary | ICD-10-CM

## 2018-12-25 DIAGNOSIS — E785 Hyperlipidemia, unspecified: Secondary | ICD-10-CM

## 2018-12-25 DIAGNOSIS — E119 Type 2 diabetes mellitus without complications: Secondary | ICD-10-CM

## 2018-12-25 NOTE — Patient Instructions (Signed)
Medication Instructions:   Your physician recommends that you continue on your current medications as directed. Please refer to the Current Medication list given to you today.  If you need a refill on your cardiac medications before your next appointment, please call your pharmacy.   Lab work:  You will need to have labs (blood work) drawn 1 day before your follow up appointment:  BMET CBC BNP  If you have labs (blood work) drawn today and your tests are completely normal, you will receive your results only by: [name] MyChart Message (if you have MyChart) OR . A paper copy in the mail If you have any lab test that is abnormal or we need to change your treatment, we will call you to review the results.  Testing/Procedures:  NONE ordered at this time of appointment   Follow-Up: At [facility], you and your health needs are our priority.  As part of our continuing mission to provide you with exceptional heart care, we have created designated Provider Care Teams.  These Care Teams include your primary Cardiologist (physician) and Advanced Practice Providers (APPs -  Physician Assistants and Nurse Practitioners) who all work together to provide you with the care you need, when you need it. . You will need a follow up appointment in 2 weeks with  Quay Burow, MD   Any Other Special Instructions Will Be Listed Below (If Applicable).

## 2018-12-25 NOTE — Progress Notes (Signed)
Virtual Visit via Video Note   This visit type was conducted due to national recommendations for restrictions regarding the COVID-19 Pandemic (e.g. social distancing) in an effort to limit this patient's exposure and mitigate transmission in our community.  Due to her co-morbid illnesses, this patient is at least at moderate risk for complications without adequate follow up.  This format is felt to be most appropriate for this patient at this time.  All issues noted in this document were discussed and addressed.  A limited physical exam was performed with this format.  Please refer to the patient's chart for her consent to telehealth for [facility].   Date:  12/25/2018   ID:  [name], DOB June 24, 1951, MRN [id number]  Patient Location: Home Provider Location: Home  PCP:  Hoyt Koch, MD  Cardiologist:  Quay Burow, MD Electrophysiologist:  None   Evaluation Performed:  Follow-Up Visit  Chief Complaint:  LE edema  History of Present Illness:    [name] is a 68 y.o. female with past medical history of CAD, permanent atrial fibrillation, DM II, HTN, HLDand COPD. She is a former patient of Dr. Melvern Banker. Dr. Melvern Banker stented her distal left circumflex posterolateral branch in October 2009,there was some residual disease in the PDA branch. EF was normal. Due to her recurrent chest discomfort, cardiac catheterization was performed in September 2012 revealing a patent stent, 50% distal PDA lesion and normal EF. Myoview in August 2016 was low risk. Last echocardiogram obtained on 07/13/2016 showed EF 60 to 65%, mildMR.   I saw the patient for lower extremity edema and weight gain 20 pounds.  I suspected she likely had a component of diastolic heart failure.  Echocardiogram showed normal EF, diastolic dysfunction could not be determined due to A. fib.  She lost 5 pounds after taking 40 mg daily of Lasix.  During the last virtual visit on 11/28/2018, I increased her  Lasix to 40 mg twice daily for 2 days before titrating down to 60 mg daily thereafter.  Renal function was stable however she had persistent hyponatremia.  Due to persistent edema, she was switched to torsemide to 20 mg daily.  She also complained of some dizziness, heart rate was in the 40s and low 50s, it was advised  however in the past 6 weeks, she only managed to lose 1 pound.  She does not feel like the medication is working for her.  Patient was contacted today via approximately video conference visit.  However video option did not initially work, therefore I initially contacted patient via telephone visit, only during the second half of the visit I switched to video conference as technical difficulty has resolved by then.  During video conference, she still has quite a bit of lower extremity pitting edema.  She also feel recurrent dizziness as well.  Recent lab work was concerning for hyponatremia.  I suspect this is related to her diuretic therapy.  Unfortunately with the presence of both swelling in the leg and also dizziness, medical treatment is difficult to manage.  Unfortunately treatment is limited by virtual telemedicine, I recommended to patient to be seen in the clinic in the next few days.  The patient does not have symptoms concerning for COVID-19 infection (fever, chills, cough, or new shortness of breath).    Past Medical History:  Diagnosis Date  . Anemia, unspecified   . Arthritis   . Asthma   . Atrial fibrillation with RVR ([place])    a.  on Xarelto  . Bell's palsy    Facial nerve decompression in 2001  . Chronic low back pain   . COPD with asthma ([place])   . Coronary artery disease    Myoview 04/12/11 was entirely normal. ECHO 02/26/08 showed only minor abnormalities. Stenting 05/26/08 of her posterolateral branch to the left circumflex coronary artery. Used a 2.5x17mm Taxus Monorail stent.myoview 2014 was without ischemia  . Diabetes mellitus    Type 2  . Early cataracts,  bilateral   . Fatty liver   . GERD (gastroesophageal reflux disease)   . Glaucoma   . Glaucoma   . Goiter   . Heart murmur   . History of nuclear stress test 2012; 2014   lexiscan; normal pattern of perfusion; normal, low risk scan   . Hyperlipidemia   . Hypertension   . Panic disorder   . Pneumonia 2008  . Polycystic ovary disease    Hysterectomy in 1982 for this  . Shortness of breath dyspnea    ECHO 02/26/08 showed only minor abnormalities  . Spinal stenosis    Past Surgical History:  Procedure Laterality Date  . ABDOMINAL HYSTERECTOMY  1982   & BSO; for polycystic ovary disease  . CARDIOVERSION N/A 12/17/2013   Procedure: CARDIOVERSION;  Surgeon: Pixie Casino, MD;  Location: [facility] ENDOSCOPY;  Service: Cardiovascular;  Laterality: N/A;  . CARDIOVERSION N/A 09/30/2015   Procedure: CARDIOVERSION;  Surgeon: Dorothy Spark, MD;  Location: [facility] ENDOSCOPY;  Service: Cardiovascular;  Laterality: N/A;  . CARDIOVERSION N/A 06/22/2016   Procedure: CARDIOVERSION;  Surgeon: Skeet Latch, MD;  Location: [place];  Service: Cardiovascular;  Laterality: N/A;  . COLONOSCOPY     last 2009; Dr Cristina Gong; due 2019  . COLONOSCOPY WITH PROPOFOL N/A 01/23/2018   Procedure: COLONOSCOPY WITH PROPOFOL;  Surgeon: Ronald Lobo, MD;  Location: [place];  Service: Endoscopy;  Laterality: N/A;  . CORONARY ANGIOPLASTY  05/26/2008   Stenting of her posterolateral branch to the left circumflex coronary artery. Used a 2.5x3mm Taxus Monorail stent.  . ESOPHAGOGASTRODUODENOSCOPY (EGD) WITH PROPOFOL N/A 01/23/2018   Procedure: ESOPHAGOGASTRODUODENOSCOPY (EGD) WITH PROPOFOL;  Surgeon: Ronald Lobo, MD;  Location: [place];  Service: Endoscopy;  Laterality: N/A;  . FACIAL NERVE DECOMPRESSION  2001/2002   bells palsy   . LAPAROSCOPIC CHOLECYSTECTOMY  06/15/2011    Dr Dalbert Batman  . TEE WITHOUT CARDIOVERSION N/A 12/17/2013   Procedure: TRANSESOPHAGEAL ECHOCARDIOGRAM (TEE);  Surgeon: Pixie Casino,  MD;  Location: [facility] ENDOSCOPY;  Service: Cardiovascular;  Laterality: N/A;  trish/ja  . TRANSTHORACIC ECHOCARDIOGRAM  1/61/0960   LV systolic function normal with mild conc LVH; LA mildly dilated; trace MR/TR  . UPPER GI ENDOSCOPY  2009   negative     Current Meds  Medication Sig  . ascorbic acid (VITAMIN C) 1000 MG tablet Take 1,000 mg by mouth 2 (two) times daily.   [name] aspirin 325 MG tablet Take 325 mg by mouth daily.  [name] CALCIUM-MAG-VIT C-VIT D PO Take 1 tablet by mouth daily.   . celecoxib (CELEBREX) 200 MG capsule Take 200 mg by mouth 2 (two) times daily.   . Continuous Blood Gluc Sensor (FREESTYLE LIBRE SENSOR SYSTEM) MISC Use to monitor sugars  . diltiazem (CARDIZEM CD) 180 MG 24 hr capsule TAKE 1 CAPSULE DAILY.  [name] gabapentin (NEURONTIN) 100 MG capsule TAKE (1) CAPSULE THREE TIMES DAILY. (Patient taking differently: 100 mg. May take 100 mg capsule 2-3 times a day)  . INVOKANA 100 MG TABS tablet TAKE 1 TABLET  ONCE DAILY.  . isosorbide mononitrate (IMDUR) 30 MG 24 hr tablet Take 0.5 tablets (15 mg total) by mouth daily.  [name] latanoprost (XALATAN) 0.005 % ophthalmic solution Place 1 drop into both eyes at bedtime.  [name] LORazepam (ATIVAN) 0.5 MG tablet TAKE 1 OR 2 TABLETS AT BEDTIME.  . Melatonin 5 MG TABS Take 2 tablets by mouth at bedtime. Take 10 mg at bedtime  . Multiple Vitamins-Iron (MULTIVITAMINS WITH IRON) TABS Take 1 tablet by mouth daily.   [name] nystatin ointment (MYCOSTATIN) Apply 1 application topically 2 (two) times daily as needed (IRRITATION).   [name] omeprazole (PRILOSEC) 20 MG capsule Take 2 capsules (40 mg total) by mouth daily.  [name] PARoxetine (PAXIL) 10 MG tablet TAKE 2 AND 1/2 TABS DAILY  . quinapril (ACCUPRIL) 40 MG tablet TAKE 1 TABLET ONCE DAILY.  [name] rOPINIRole (REQUIP XL) 2 MG 24 hr tablet TAKE ONE TABLET AT BEDTIME.  [name] rOPINIRole (REQUIP) 0.5 MG tablet Take 1 to 2 tab at midnight. ( XL form at 6 PM)  . rosuvastatin (CRESTOR) 10 MG tablet TAKE ONE TABLET AT BEDTIME.  [name] senna  (SENOKOT) 8.6 MG TABS Take 1 tablet by mouth daily.   . sitaGLIPtin-metformin (JANUMET) 50-1000 MG tablet TAKE 1/2 TABLET IN THE MORNING WITH BREAKFAST AND 1 TABLET IN THE EVENING WITH SUPPER.  . Tiotropium Bromide-Olodaterol (STIOLTO RESPIMAT) 2.5-2.5 MCG/ACT AERS Inhale 2 puffs into the lungs daily.  [name] torsemide (DEMADEX) 20 MG tablet Take 1 tablet (20 mg total) by mouth daily.  Alveda Reasons 20 MG TABS tablet TAKE 1 TABLET ONCE DAILY WITH SUPPER.     Allergies:   Benadryl [diphenhydramine hcl]; Clopidogrel bisulfate; and Contrast media [iodinated diagnostic agents]   Social History   Tobacco Use  . Smoking status: Former Smoker    Packs/day: 1.50    Years: 30.00    Pack years: 45.00    Types: Cigarettes    Last attempt to quit: 08/13/1993    Years since quitting: 25.3  . Smokeless tobacco: Never Used  Substance Use Topics  . Alcohol use: No  . Drug use: No     Family Hx: The patient's family history includes Cancer in her brother and sister; Colon cancer (age of onset: 48) in her brother; Colon polyps in her sister; Emphysema (age of onset: 45) in her mother; Gout in her brother; Heart attack (age of onset: 66) in her father; Hyperlipidemia in her brother; Hypertension in her brother; Pneumonia in her maternal grandmother; Ulcers in her mother. There is no history of Diabetes or Stroke.  ROS:   Please see the history of present illness.     All other systems reviewed and are negative.   Prior CV studies:   The following studies were reviewed today:  Echo 11/20/2018 IMPRESSIONS    1. The left ventricle has normal systolic function, with an ejection fraction of 55-60%. The cavity size was normal. Left ventricular diastolic Doppler parameters are indeterminate.  2. The right ventricle has normal systolic function. The cavity was normal. There is no increase in right ventricular wall thickness.  3. Left atrial size was mildly dilated.  4. Right atrial size was mildly dilated.   5. Mild thickening of the mitral valve leaflet. Mild calcification of the mitral valve leaflet.  6. The aortic valve is tricuspid. Moderate thickening of the aortic valve. Sclerosis without any evidence of stenosis of the aortic valve. Aortic valve regurgitation is trivial by color flow Doppler.  Labs/Other Tests and Data Reviewed:  EKG:  An ECG dated 11/06/2018 was personally reviewed today and demonstrated:  Atrial fibrillation.  Recent Labs: 10/02/2018: ALT 12; Hemoglobin 11.5; Platelets 317.0; TSH 2.21 11/27/2018: Pro B Natriuretic peptide (BNP) 219.0 12/22/2018: BUN 18; Creatinine, Ser 0.81; Potassium 4.5; Sodium 123   Recent Lipid Panel Lab Results  Component Value Date/Time   CHOL 118 10/02/2018 12:03 PM   TRIG 123.0 10/02/2018 12:03 PM   HDL 51.10 10/02/2018 12:03 PM   CHOLHDL 2 10/02/2018 12:03 PM   LDLCALC 42 10/02/2018 12:03 PM   LDLDIRECT 143.4 01/23/2008 01:16 PM    Wt Readings from Last 3 Encounters:  11/27/18 197 lb 12.8 oz (89.7 kg)  11/06/18 202 lb 12.8 oz (92 kg)  10/11/18 196 lb 8 oz (89.1 kg)     Objective:    Vital Signs:  There were no vitals taken for this visit.   VITAL SIGNS:  reviewed  ASSESSMENT & PLAN:    1. Lower extremity edema: She also has dyspnea.  I am worried about acute heart failure.  She was recently switched from 60 mg daily of Lasix to 20 mg daily of torsemide.  She appears to have quite a significant lower extremity edema and torsemide does not seems to work for her very well.  Unfortunately visit is limited by the quality of video conference visit, I recommend taking clinic visit for further adjustment.  2. Hyponatremia: likely related to the recent diuretic therapy however lower extremity edema continue to pose an issue.  She is having recurrent weakness and dizziness.  I worried this might be related to the hyponatremia.  We will repeat her lab work.  3. Permanent atrial fibrillation: On Xarelto 20 mg daily.  Rate controlled on  metoprolol  4. Hypertension: No vital signs was supplied during this visit.  5. Hyperlipidemia: On Crestor 10 mg daily.  6. DM 2: Managed by primary care provider.  7. CAD: Denies any anginal symptom.  COVID-19 Education: The signs and symptoms of COVID-19 were discussed with the patient and how to seek care for testing (follow up with PCP or arrange E-visit). The importance of social distancing was discussed today.  Time:   Today, I have spent 7 telephone + 9 min video with the patient with telehealth technology discussing the above problems.     Medication Adjustments/Labs and Tests Ordered: Current medicines are reviewed at length with the patient today.  Concerns regarding medicines are outlined above.   Tests Ordered: No orders of the defined types were placed in this encounter.   Medication Changes: No orders of the defined types were placed in this encounter.   Disposition:  Follow up in 1 week(s)  Signed, Almyra Deforest, PA  12/25/2018 2:51 PM    [facility]

## 2018-12-25 NOTE — Telephone Encounter (Signed)

## 2018-12-25 NOTE — Telephone Encounter (Signed)
New Message    Pt is awaiting a phone call for her appt   Please call [phone]

## 2018-12-25 NOTE — Telephone Encounter (Signed)
Pt was seen by virtual visit today with Almyra Deforest, PA.  Please see that note for further details

## 2018-12-29 ENCOUNTER — Other Ambulatory Visit: Payer: Self-pay

## 2018-12-29 ENCOUNTER — Telehealth: Payer: Self-pay

## 2018-12-29 ENCOUNTER — Telehealth: Payer: Self-pay | Admitting: Cardiovascular Disease

## 2018-12-29 ENCOUNTER — Inpatient Hospital Stay (HOSPITAL_COMMUNITY)
Admission: EM | Admit: 2018-12-29 | Discharge: 2018-12-31 | DRG: 292 | Disposition: A | Discharge status: Home / Self Care | Payer: PPO | Source: Ambulatory Visit | Attending: Internal Medicine | Admitting: Internal Medicine

## 2018-12-29 ENCOUNTER — Emergency Department (HOSPITAL_COMMUNITY): Payer: PPO

## 2018-12-29 ENCOUNTER — Emergency Department: Payer: Self-pay

## 2018-12-29 ENCOUNTER — Encounter (HOSPITAL_COMMUNITY): Payer: Self-pay

## 2018-12-29 DIAGNOSIS — E785 Hyperlipidemia, unspecified: Secondary | ICD-10-CM | POA: Diagnosis present

## 2018-12-29 DIAGNOSIS — K922 Gastrointestinal hemorrhage, unspecified: Secondary | ICD-10-CM | POA: Diagnosis present

## 2018-12-29 DIAGNOSIS — Z9861 Coronary angioplasty status: Secondary | ICD-10-CM

## 2018-12-29 DIAGNOSIS — K219 Gastro-esophageal reflux disease without esophagitis: Secondary | ICD-10-CM | POA: Diagnosis not present

## 2018-12-29 DIAGNOSIS — Z90710 Acquired absence of both cervix and uterus: Secondary | ICD-10-CM | POA: Diagnosis not present

## 2018-12-29 DIAGNOSIS — I4821 Permanent atrial fibrillation: Secondary | ICD-10-CM | POA: Diagnosis not present

## 2018-12-29 DIAGNOSIS — D500 Iron deficiency anemia secondary to blood loss (chronic): Secondary | ICD-10-CM | POA: Diagnosis not present

## 2018-12-29 DIAGNOSIS — Z8701 Personal history of pneumonia (recurrent): Secondary | ICD-10-CM | POA: Diagnosis not present

## 2018-12-29 DIAGNOSIS — I110 Hypertensive heart disease with heart failure: Principal | ICD-10-CM | POA: Diagnosis present

## 2018-12-29 DIAGNOSIS — I2510 Atherosclerotic heart disease of native coronary artery without angina pectoris: Secondary | ICD-10-CM

## 2018-12-29 DIAGNOSIS — Z7901 Long term (current) use of anticoagulants: Secondary | ICD-10-CM | POA: Diagnosis not present

## 2018-12-29 DIAGNOSIS — I5033 Acute on chronic diastolic (congestive) heart failure: Secondary | ICD-10-CM | POA: Diagnosis present

## 2018-12-29 DIAGNOSIS — M545 Low back pain: Secondary | ICD-10-CM | POA: Diagnosis present

## 2018-12-29 DIAGNOSIS — Z20828 Contact with and (suspected) exposure to other viral communicable diseases: Secondary | ICD-10-CM | POA: Diagnosis not present

## 2018-12-29 DIAGNOSIS — E1159 Type 2 diabetes mellitus with other circulatory complications: Secondary | ICD-10-CM | POA: Diagnosis not present

## 2018-12-29 DIAGNOSIS — D509 Iron deficiency anemia, unspecified: Secondary | ICD-10-CM | POA: Diagnosis not present

## 2018-12-29 DIAGNOSIS — Z791 Long term (current) use of non-steroidal anti-inflammatories (NSAID): Secondary | ICD-10-CM | POA: Diagnosis not present

## 2018-12-29 DIAGNOSIS — J449 Chronic obstructive pulmonary disease, unspecified: Secondary | ICD-10-CM | POA: Diagnosis not present

## 2018-12-29 DIAGNOSIS — F410 Panic disorder [episodic paroxysmal anxiety] without agoraphobia: Secondary | ICD-10-CM | POA: Diagnosis not present

## 2018-12-29 DIAGNOSIS — Z7982 Long term (current) use of aspirin: Secondary | ICD-10-CM

## 2018-12-29 DIAGNOSIS — Z8249 Family history of ischemic heart disease and other diseases of the circulatory system: Secondary | ICD-10-CM

## 2018-12-29 DIAGNOSIS — E871 Hypo-osmolality and hyponatremia: Secondary | ICD-10-CM | POA: Diagnosis not present

## 2018-12-29 DIAGNOSIS — Z8349 Family history of other endocrine, nutritional and metabolic diseases: Secondary | ICD-10-CM | POA: Diagnosis not present

## 2018-12-29 DIAGNOSIS — Z7951 Long term (current) use of inhaled steroids: Secondary | ICD-10-CM

## 2018-12-29 DIAGNOSIS — Z87891 Personal history of nicotine dependence: Secondary | ICD-10-CM | POA: Diagnosis not present

## 2018-12-29 DIAGNOSIS — R079 Chest pain, unspecified: Secondary | ICD-10-CM | POA: Diagnosis not present

## 2018-12-29 DIAGNOSIS — E1151 Type 2 diabetes mellitus with diabetic peripheral angiopathy without gangrene: Secondary | ICD-10-CM | POA: Diagnosis present

## 2018-12-29 DIAGNOSIS — G8929 Other chronic pain: Secondary | ICD-10-CM | POA: Diagnosis present

## 2018-12-29 DIAGNOSIS — H409 Unspecified glaucoma: Secondary | ICD-10-CM | POA: Diagnosis present

## 2018-12-29 DIAGNOSIS — R0602 Shortness of breath: Secondary | ICD-10-CM | POA: Diagnosis not present

## 2018-12-29 DIAGNOSIS — D649 Anemia, unspecified: Secondary | ICD-10-CM | POA: Diagnosis present

## 2018-12-29 DIAGNOSIS — Z955 Presence of coronary angioplasty implant and graft: Secondary | ICD-10-CM | POA: Diagnosis not present

## 2018-12-29 DIAGNOSIS — Z79899 Other long term (current) drug therapy: Secondary | ICD-10-CM

## 2018-12-29 HISTORY — DX: Heart failure, unspecified: I50.9

## 2018-12-29 LAB — TROPONIN I: Troponin I: <0.03 ng/mL (ref <0.03)

## 2018-12-29 LAB — CBC
HCT: 25.7 % — ABNORMAL LOW (ref 36.0–46.0)
Hemoglobin: 8 g/dL — ABNORMAL LOW (ref 12.0–15.0)
MCH: 23.1 pg — ABNORMAL LOW (ref 26.0–34.0)
MCHC: 31.1 g/dL (ref 30.0–36.0)
MCV: 74.3 fL — ABNORMAL LOW (ref 80.0–100.0)
Platelets: 279 10*3/uL (ref 150–400)
RBC: 3.46 MIL/uL — ABNORMAL LOW (ref 3.87–5.11)
RDW: 16.4 % — ABNORMAL HIGH (ref 11.5–15.5)
WBC: 9.8 10*3/uL (ref 4.0–10.5)
nRBC: 0 % (ref 0.0–0.2)

## 2018-12-29 LAB — BASIC METABOLIC PANEL
Anion gap: 10 (ref 5–15)
BUN: 15 mg/dL (ref 8–23)
CO2: 22 mmol/L (ref 22–32)
Calcium: 8.9 mg/dL (ref 8.9–10.3)
Chloride: 89 mmol/L — ABNORMAL LOW (ref 98–111)
Creatinine, Ser: 0.91 mg/dL (ref 0.44–1.00)
GFR calc Af Amer: >60 mL/min (ref >60)
GFR calc non Af Amer: >60 mL/min (ref >60)
Glucose, Bld: 155 mg/dL — ABNORMAL HIGH (ref 70–99)
Potassium: 5.1 mmol/L (ref 3.5–5.1)
Sodium: 121 mmol/L — ABNORMAL LOW (ref 135–145)

## 2018-12-29 LAB — PROTIME-INR
INR: 1.9 — ABNORMAL HIGH (ref 0.8–1.2)
Prothrombin Time: 21.8 seconds — ABNORMAL HIGH (ref 11.4–15.2)

## 2018-12-29 NOTE — Telephone Encounter (Signed)
Spoke with pt who is aware of appt in office with Dr. Gwenlyn Found on 5/26 at 10:45am. Pt verbalized understanding

## 2018-12-29 NOTE — Telephone Encounter (Signed)
° ° °  Patient calling, states she was told Dr Gwenlyn Found needed to see her in the office this week.  Please advise

## 2018-12-29 NOTE — ED Notes (Signed)
Pt's sps. Horald Pollen [phone]. Pls contact with update and if sps is allowed in the back

## 2018-12-29 NOTE — Telephone Encounter (Signed)
Received call from Maudie Mercury at [facility] regarding patient. She is currently in the office with shortness of breath and LE edema. Chest xray done in office shows heart failure. Discussed with Dr Ellyn Hack and he suggests patient go to ED for evaluation. Renold Don at [facility]

## 2018-12-29 NOTE — ED Triage Notes (Signed)
Patient was sent here by her dr after having a chest xray that per her provider revealed CHF. Admits to to having, swelling, SOB and CP for 3 months.

## 2018-12-29 NOTE — ED Notes (Signed)
Radiology was unsuccessful in fully uploading other provider's cxr. CXR re-ordered so that it can be read by radiologist.

## 2018-12-30 ENCOUNTER — Encounter (HOSPITAL_COMMUNITY): Payer: Self-pay | Admitting: Family Medicine

## 2018-12-30 ENCOUNTER — Encounter: Payer: Self-pay | Admitting: Neurology

## 2018-12-30 ENCOUNTER — Telehealth: Payer: Self-pay | Admitting: Cardiovascular Disease

## 2018-12-30 DIAGNOSIS — I4821 Permanent atrial fibrillation: Secondary | ICD-10-CM | POA: Diagnosis present

## 2018-12-30 DIAGNOSIS — E1151 Type 2 diabetes mellitus with diabetic peripheral angiopathy without gangrene: Secondary | ICD-10-CM | POA: Diagnosis present

## 2018-12-30 DIAGNOSIS — Z8701 Personal history of pneumonia (recurrent): Secondary | ICD-10-CM | POA: Diagnosis not present

## 2018-12-30 DIAGNOSIS — Z955 Presence of coronary angioplasty implant and graft: Secondary | ICD-10-CM | POA: Diagnosis not present

## 2018-12-30 DIAGNOSIS — E871 Hypo-osmolality and hyponatremia: Secondary | ICD-10-CM | POA: Diagnosis present

## 2018-12-30 DIAGNOSIS — I2510 Atherosclerotic heart disease of native coronary artery without angina pectoris: Secondary | ICD-10-CM

## 2018-12-30 DIAGNOSIS — J449 Chronic obstructive pulmonary disease, unspecified: Secondary | ICD-10-CM | POA: Diagnosis present

## 2018-12-30 DIAGNOSIS — K922 Gastrointestinal hemorrhage, unspecified: Secondary | ICD-10-CM | POA: Diagnosis present

## 2018-12-30 DIAGNOSIS — H409 Unspecified glaucoma: Secondary | ICD-10-CM | POA: Diagnosis present

## 2018-12-30 DIAGNOSIS — Z791 Long term (current) use of non-steroidal anti-inflammatories (NSAID): Secondary | ICD-10-CM | POA: Diagnosis not present

## 2018-12-30 DIAGNOSIS — Z87891 Personal history of nicotine dependence: Secondary | ICD-10-CM | POA: Diagnosis not present

## 2018-12-30 DIAGNOSIS — I110 Hypertensive heart disease with heart failure: Secondary | ICD-10-CM | POA: Diagnosis present

## 2018-12-30 DIAGNOSIS — D509 Iron deficiency anemia, unspecified: Secondary | ICD-10-CM

## 2018-12-30 DIAGNOSIS — I5033 Acute on chronic diastolic (congestive) heart failure: Secondary | ICD-10-CM | POA: Diagnosis present

## 2018-12-30 DIAGNOSIS — Z8249 Family history of ischemic heart disease and other diseases of the circulatory system: Secondary | ICD-10-CM | POA: Diagnosis not present

## 2018-12-30 DIAGNOSIS — Z8349 Family history of other endocrine, nutritional and metabolic diseases: Secondary | ICD-10-CM | POA: Diagnosis not present

## 2018-12-30 DIAGNOSIS — D500 Iron deficiency anemia secondary to blood loss (chronic): Secondary | ICD-10-CM | POA: Diagnosis present

## 2018-12-30 DIAGNOSIS — E1159 Type 2 diabetes mellitus with other circulatory complications: Secondary | ICD-10-CM

## 2018-12-30 DIAGNOSIS — K219 Gastro-esophageal reflux disease without esophagitis: Secondary | ICD-10-CM | POA: Diagnosis present

## 2018-12-30 DIAGNOSIS — Z7951 Long term (current) use of inhaled steroids: Secondary | ICD-10-CM | POA: Diagnosis not present

## 2018-12-30 DIAGNOSIS — Z7901 Long term (current) use of anticoagulants: Secondary | ICD-10-CM | POA: Diagnosis not present

## 2018-12-30 DIAGNOSIS — Z90710 Acquired absence of both cervix and uterus: Secondary | ICD-10-CM | POA: Diagnosis not present

## 2018-12-30 DIAGNOSIS — Z20828 Contact with and (suspected) exposure to other viral communicable diseases: Secondary | ICD-10-CM | POA: Diagnosis present

## 2018-12-30 DIAGNOSIS — Z7982 Long term (current) use of aspirin: Secondary | ICD-10-CM | POA: Diagnosis not present

## 2018-12-30 DIAGNOSIS — F410 Panic disorder [episodic paroxysmal anxiety] without agoraphobia: Secondary | ICD-10-CM | POA: Diagnosis present

## 2018-12-30 DIAGNOSIS — E785 Hyperlipidemia, unspecified: Secondary | ICD-10-CM | POA: Diagnosis present

## 2018-12-30 DIAGNOSIS — Z9861 Coronary angioplasty status: Secondary | ICD-10-CM

## 2018-12-30 LAB — RETICULOCYTES
Immature Retic Fract: 33.6 % — ABNORMAL HIGH (ref 2.3–15.9)
RBC.: 3.5 MIL/uL — ABNORMAL LOW (ref 3.87–5.11)
Retic Count, Absolute: 125 10*3/uL (ref 19.0–186.0)
Retic Ct Pct: 3.6 % — ABNORMAL HIGH (ref 0.4–3.1)

## 2018-12-30 LAB — POC OCCULT BLOOD, ED: Fecal Occult Bld: NEGATIVE

## 2018-12-30 LAB — CBC
HCT: 25.9 % — ABNORMAL LOW (ref 36.0–46.0)
Hemoglobin: 8 g/dL — ABNORMAL LOW (ref 12.0–15.0)
MCH: 22.9 pg — ABNORMAL LOW (ref 26.0–34.0)
MCHC: 30.9 g/dL (ref 30.0–36.0)
MCV: 74 fL — ABNORMAL LOW (ref 80.0–100.0)
Platelets: 290 10*3/uL (ref 150–400)
RBC: 3.5 MIL/uL — ABNORMAL LOW (ref 3.87–5.11)
RDW: 16.5 % — ABNORMAL HIGH (ref 11.5–15.5)
WBC: 9.9 10*3/uL (ref 4.0–10.5)
nRBC: 0 % (ref 0.0–0.2)

## 2018-12-30 LAB — IRON AND TIBC
Iron: 21 ug/dL — ABNORMAL LOW (ref 28–170)
Saturation Ratios: 3 % — ABNORMAL LOW (ref 10.4–31.8)
TIBC: 606 ug/dL — ABNORMAL HIGH (ref 250–450)
UIBC: 585 ug/dL

## 2018-12-30 LAB — GLUCOSE, CAPILLARY
Glucose-Capillary: 149 mg/dL — ABNORMAL HIGH (ref 70–99)
Glucose-Capillary: 145 mg/dL — ABNORMAL HIGH (ref 70–99)
Glucose-Capillary: 121 mg/dL — ABNORMAL HIGH (ref 70–99)
Glucose-Capillary: 133 mg/dL — ABNORMAL HIGH (ref 70–99)

## 2018-12-30 LAB — BASIC METABOLIC PANEL
Anion gap: 10 (ref 5–15)
BUN: 14 mg/dL (ref 8–23)
CO2: 23 mmol/L (ref 22–32)
Calcium: 9 mg/dL (ref 8.9–10.3)
Chloride: 89 mmol/L — ABNORMAL LOW (ref 98–111)
Creatinine, Ser: 0.9 mg/dL (ref 0.44–1.00)
GFR calc Af Amer: >60 mL/min (ref >60)
GFR calc non Af Amer: >60 mL/min (ref >60)
Glucose, Bld: 133 mg/dL — ABNORMAL HIGH (ref 70–99)
Potassium: 4.2 mmol/L (ref 3.5–5.1)
Sodium: 122 mmol/L — ABNORMAL LOW (ref 135–145)

## 2018-12-30 LAB — FERRITIN: Ferritin: 20 ng/mL (ref 11–307)

## 2018-12-30 LAB — SARS CORONAVIRUS 2 BY RT PCR (HOSPITAL ORDER, PERFORMED IN ~~LOC~~ HOSPITAL LAB): SARS Coronavirus 2: NEGATIVE

## 2018-12-30 LAB — URINALYSIS, ROUTINE W REFLEX MICROSCOPIC
Bilirubin Urine: NEGATIVE
Glucose, UA: >=500 mg/dL — AB
Hgb urine dipstick: NEGATIVE
Ketones, ur: NEGATIVE mg/dL
Leukocytes,Ua: NEGATIVE
Nitrite: POSITIVE — AB
Protein, ur: NEGATIVE mg/dL
Specific Gravity, Urine: 1.003 — ABNORMAL LOW (ref 1.005–1.030)
pH: 5 (ref 5.0–8.0)

## 2018-12-30 LAB — ABO/RH: ABO/RH(D): A POS

## 2018-12-30 LAB — BRAIN NATRIURETIC PEPTIDE: B Natriuretic Peptide: 277.4 pg/mL — ABNORMAL HIGH (ref 0.0–100.0)

## 2018-12-30 LAB — HIV ANTIBODY (ROUTINE TESTING W REFLEX): HIV Screen 4th Generation wRfx: NONREACTIVE

## 2018-12-30 LAB — VITAMIN B12: Vitamin B-12: 655 pg/mL (ref 180–914)

## 2018-12-30 LAB — NA AND K (SODIUM & POTASSIUM), RAND UR
Potassium Urine: 18 mmol/L
Sodium, Ur: 58 mmol/L

## 2018-12-30 LAB — OSMOLALITY, URINE: Osmolality, Ur: 228 mOsm/kg — ABNORMAL LOW (ref 300–900)

## 2018-12-30 LAB — TYPE AND SCREEN
ABO/RH(D): A POS
Antibody Screen: NEGATIVE

## 2018-12-30 LAB — FOLATE: Folate: 37.6 ng/mL (ref >5.9)

## 2018-12-30 MED ORDER — ROSUVASTATIN CALCIUM 5 MG PO TABS
10.0000 mg | ORAL_TABLET | Freq: Every day | ORAL | Status: DC
  Administered 2018-12-30 – 2018-12-31 (×2): 10 mg via ORAL
  Filled 2018-12-30 (×2): qty 2

## 2018-12-30 MED ORDER — ACETAMINOPHEN 325 MG PO TABS: 650.0000 mg | ORAL_TABLET | ORAL | Status: DC | PRN

## 2018-12-30 MED ORDER — ISOSORBIDE MONONITRATE ER 30 MG PO TB24
15.0000 mg | ORAL_TABLET | Freq: Every day | ORAL | Status: DC
  Administered 2018-12-30 – 2018-12-31 (×2): 15 mg via ORAL
  Filled 2018-12-30 (×2): qty 1

## 2018-12-30 MED ORDER — LORAZEPAM 0.5 MG PO TABS: 0.5000 mg | ORAL_TABLET | Freq: Every evening | ORAL | Status: DC | PRN

## 2018-12-30 MED ORDER — PANTOPRAZOLE SODIUM 40 MG PO TBEC
40.0000 mg | DELAYED_RELEASE_TABLET | Freq: Every day | ORAL | Status: DC
  Administered 2018-12-30 – 2018-12-31 (×2): 40 mg via ORAL
  Filled 2018-12-30 (×2): qty 1

## 2018-12-30 MED ORDER — SENNA 8.6 MG PO TABS
1.0000 | ORAL_TABLET | Freq: Every day | ORAL | Status: DC
  Administered 2018-12-30: 8.6 mg via ORAL
  Filled 2018-12-30: qty 1

## 2018-12-30 MED ORDER — QUINAPRIL HCL 10 MG PO TABS
40.0000 mg | ORAL_TABLET | Freq: Every day | ORAL | Status: DC
  Filled 2018-12-30: qty 4

## 2018-12-30 MED ORDER — GABAPENTIN 100 MG PO CAPS
100.0000 mg | ORAL_CAPSULE | Freq: Two times a day (BID) | ORAL | Status: DC
  Administered 2018-12-30 – 2018-12-31 (×3): 100 mg via ORAL
  Filled 2018-12-30 (×3): qty 1

## 2018-12-30 MED ORDER — ASPIRIN 325 MG PO TABS
325.0000 mg | ORAL_TABLET | Freq: Every day | ORAL | Status: DC
  Administered 2018-12-30 – 2018-12-31 (×2): 325 mg via ORAL
  Filled 2018-12-30 (×2): qty 1

## 2018-12-30 MED ORDER — SODIUM CHLORIDE 0.9 % IV SOLN
510.0000 mg | Freq: Once | INTRAVENOUS | Status: AC
  Administered 2018-12-30: 510 mg via INTRAVENOUS
  Filled 2018-12-30: qty 17

## 2018-12-30 MED ORDER — ALBUTEROL SULFATE (2.5 MG/3ML) 0.083% IN NEBU: 2.5000 mg | INHALATION_SOLUTION | Freq: Four times a day (QID) | RESPIRATORY_TRACT | Status: DC | PRN

## 2018-12-30 MED ORDER — ROPINIROLE HCL 1 MG PO TABS
1.0000 mg | ORAL_TABLET | Freq: Two times a day (BID) | ORAL | Status: DC
  Administered 2018-12-30 – 2018-12-31 (×3): 1 mg via ORAL
  Filled 2018-12-30 (×4): qty 1

## 2018-12-30 MED ORDER — ONDANSETRON HCL 4 MG/2ML IJ SOLN: 4.0000 mg | Freq: Four times a day (QID) | INTRAMUSCULAR | Status: DC | PRN

## 2018-12-30 MED ORDER — INSULIN ASPART 100 UNIT/ML ~~LOC~~ SOLN: 0.0000 [IU] | Freq: Every day | SUBCUTANEOUS | Status: DC

## 2018-12-30 MED ORDER — MOMETASONE FURO-FORMOTEROL FUM 200-5 MCG/ACT IN AERO
2.0000 | INHALATION_SPRAY | Freq: Two times a day (BID) | RESPIRATORY_TRACT | Status: DC
  Administered 2018-12-30 – 2018-12-31 (×2): 2 via RESPIRATORY_TRACT
  Filled 2018-12-30: qty 8.8

## 2018-12-30 MED ORDER — PANTOPRAZOLE SODIUM 40 MG PO TBEC: 40.0000 mg | DELAYED_RELEASE_TABLET | Freq: Two times a day (BID) | ORAL | Status: DC

## 2018-12-30 MED ORDER — ALBUTEROL SULFATE HFA 108 (90 BASE) MCG/ACT IN AERS: 1.0000 | INHALATION_SPRAY | Freq: Four times a day (QID) | RESPIRATORY_TRACT | Status: DC | PRN

## 2018-12-30 MED ORDER — FUROSEMIDE 10 MG/ML IJ SOLN
40.0000 mg | Freq: Once | INTRAMUSCULAR | Status: AC
  Administered 2018-12-30: 40 mg via INTRAVENOUS
  Filled 2018-12-30: qty 4

## 2018-12-30 MED ORDER — DILTIAZEM HCL ER COATED BEADS 180 MG PO CP24
180.0000 mg | ORAL_CAPSULE | Freq: Every day | ORAL | Status: DC
  Administered 2018-12-30 – 2018-12-31 (×2): 180 mg via ORAL
  Filled 2018-12-30 (×2): qty 1

## 2018-12-30 MED ORDER — SODIUM CHLORIDE 0.9 % IV SOLN: 250.0000 mL | INTRAVENOUS | Status: DC | PRN

## 2018-12-30 MED ORDER — PAROXETINE HCL 20 MG PO TABS
25.0000 mg | ORAL_TABLET | Freq: Every day | ORAL | Status: DC
  Administered 2018-12-30 – 2018-12-31 (×2): 25 mg via ORAL
  Filled 2018-12-30 (×2): qty 0.5

## 2018-12-30 MED ORDER — ROPINIROLE HCL 1 MG PO TABS
1.0000 mg | ORAL_TABLET | Freq: Once | ORAL | Status: AC
  Administered 2018-12-30: 1 mg via ORAL
  Filled 2018-12-30: qty 1

## 2018-12-30 MED ORDER — METOPROLOL SUCCINATE ER 100 MG PO TB24
100.0000 mg | ORAL_TABLET | Freq: Every day | ORAL | Status: DC
  Administered 2018-12-30: 100 mg via ORAL
  Filled 2018-12-30: qty 1

## 2018-12-30 MED ORDER — SODIUM CHLORIDE 0.9% FLUSH
3.0000 mL | Freq: Two times a day (BID) | INTRAVENOUS | Status: DC
  Administered 2018-12-30 – 2018-12-31 (×3): 3 mL via INTRAVENOUS

## 2018-12-30 MED ORDER — RIVAROXABAN 20 MG PO TABS
20.0000 mg | ORAL_TABLET | Freq: Every day | ORAL | Status: DC
  Administered 2018-12-30 – 2018-12-31 (×2): 20 mg via ORAL
  Filled 2018-12-30 (×2): qty 1

## 2018-12-30 MED ORDER — SENNA 8.6 MG PO TABS
1.0000 | ORAL_TABLET | Freq: Every day | ORAL | Status: DC
  Filled 2018-12-30: qty 1

## 2018-12-30 MED ORDER — SODIUM CHLORIDE 0.9% FLUSH: 3.0000 mL | INTRAVENOUS | Status: DC | PRN

## 2018-12-30 MED ORDER — QUINAPRIL HCL 10 MG PO TABS
40.0000 mg | ORAL_TABLET | Freq: Every day | ORAL | Status: DC
  Administered 2018-12-30: 40 mg via ORAL
  Filled 2018-12-30 (×2): qty 4

## 2018-12-30 MED ORDER — FUROSEMIDE 10 MG/ML IJ SOLN
40.0000 mg | Freq: Two times a day (BID) | INTRAMUSCULAR | Status: DC
  Administered 2018-12-30 – 2018-12-31 (×3): 40 mg via INTRAVENOUS
  Filled 2018-12-30 (×3): qty 4

## 2018-12-30 MED ORDER — INSULIN ASPART 100 UNIT/ML ~~LOC~~ SOLN
0.0000 [IU] | Freq: Three times a day (TID) | SUBCUTANEOUS | Status: DC
  Administered 2018-12-30 – 2018-12-31 (×4): 1 [IU] via SUBCUTANEOUS
  Administered 2018-12-31: 2 [IU] via SUBCUTANEOUS
  Administered 2018-12-31: 1 [IU] via SUBCUTANEOUS

## 2018-12-30 MED ORDER — MELATONIN 3 MG PO TABS
9.0000 mg | ORAL_TABLET | Freq: Every day | ORAL | Status: DC
  Administered 2018-12-30: 9 mg via ORAL
  Filled 2018-12-30 (×2): qty 3

## 2018-12-30 NOTE — Telephone Encounter (Signed)
PT CURRENTLY ADMITTED

## 2018-12-30 NOTE — Telephone Encounter (Signed)
New Message    Pts wife is calling and says pt was Admitted to the hospital. She would like more information and is asking for the nurse to call back

## 2018-12-30 NOTE — Progress Notes (Signed)
Patient has home medications at bedside.  Daughter who is an employee is picking them up to take home after she gets off of work.

## 2018-12-30 NOTE — Telephone Encounter (Signed)
Spoke with Ashby Dawes, ok per DPR and advised to call hospital nurses station for update. They can have nurse/MD/PA call her back with information

## 2018-12-30 NOTE — ED Notes (Signed)
ED TO INPATIENT HANDOFF REPORT  ED Nurse Name and Phone #:  [phone]  S Name/Age/Gender [name] 68 y.o. female Room/Bed: 032C/032C  Code Status   Code Status: Prior  Home/SNF/Other Home Patient oriented to: self, place, time and situation Is this baseline? Yes   Triage Complete: Triage complete  Chief Complaint congestive heart failure   Triage Note Patient was sent here by her dr after having a chest xray that per her provider revealed CHF. Admits to to having, swelling, SOB and CP for 3 months.   Allergies Allergies  Allergen Reactions  . Benadryl [Diphenhydramine Hcl] Other (See Comments)    Restless leg  . Clopidogrel Bisulfate Nausea Only    Nausea & pain  . Contrast Media [Iodinated Diagnostic Agents] Palpitations    Rapid heart rate, hot    Level of Care/Admitting Diagnosis ED Disposition    ED Disposition Condition Comment   Admit  Hospital Area: [place] [[id number]]  Level of Care: Telemetry Cardiac [103]  I expect the patient will be discharged within 24 hours: No (not a candidate for 5C-Observation unit)  Covid Evaluation: Screening Protocol (No Symptoms)  Diagnosis: Acute on chronic diastolic CHF (congestive heart failure) [facility]) [[id number]]  Admitting Physician: Vianne Bulls [[id number]]  Attending Physician: Vianne Bulls [[id number]]  PT Class (Do Not Modify): Observation [104]  PT Acc Code (Do Not Modify): Observation [10022]       B Medical/Surgery History Past Medical History:  Diagnosis Date  . Anemia, unspecified   . Arthritis   . Asthma   . Atrial fibrillation with RVR ([place])    a. on Xarelto  . Bell's palsy    Facial nerve decompression in 2001  . Chronic low back pain   . COPD with asthma ([place])   . Coronary artery disease    Myoview 04/12/11 was entirely normal. ECHO 02/26/08 showed only minor abnormalities. Stenting 05/26/08 of her posterolateral branch to the left circumflex coronary artery. Used a  2.5x23mm Taxus Monorail stent.myoview 2014 was without ischemia  . Diabetes mellitus    Type 2  . Early cataracts, bilateral   . Fatty liver   . GERD (gastroesophageal reflux disease)   . Glaucoma   . Glaucoma   . Goiter   . Heart murmur   . History of nuclear stress test 2012; 2014   lexiscan; normal pattern of perfusion; normal, low risk scan   . Hyperlipidemia   . Hypertension   . Panic disorder   . Pneumonia 2008  . Polycystic ovary disease    Hysterectomy in 1982 for this  . Shortness of breath dyspnea    ECHO 02/26/08 showed only minor abnormalities  . Spinal stenosis    Past Surgical History:  Procedure Laterality Date  . ABDOMINAL HYSTERECTOMY  1982   & BSO; for polycystic ovary disease  . CARDIOVERSION N/A 12/17/2013   Procedure: CARDIOVERSION;  Surgeon: Pixie Casino, MD;  Location: [facility] ENDOSCOPY;  Service: Cardiovascular;  Laterality: N/A;  . CARDIOVERSION N/A 09/30/2015   Procedure: CARDIOVERSION;  Surgeon: Dorothy Spark, MD;  Location: [facility] ENDOSCOPY;  Service: Cardiovascular;  Laterality: N/A;  . CARDIOVERSION N/A 06/22/2016   Procedure: CARDIOVERSION;  Surgeon: Skeet Latch, MD;  Location: [place];  Service: Cardiovascular;  Laterality: N/A;  . COLONOSCOPY     last 2009; Dr Cristina Gong; due 2019  . COLONOSCOPY WITH PROPOFOL N/A 01/23/2018   Procedure: COLONOSCOPY WITH PROPOFOL;  Surgeon: Ronald Lobo, MD;  Location: [place];  Service: Endoscopy;  Laterality: N/A;  . CORONARY ANGIOPLASTY  05/26/2008   Stenting of her posterolateral branch to the left circumflex coronary artery. Used a 2.5x19mm Taxus Monorail stent.  . ESOPHAGOGASTRODUODENOSCOPY (EGD) WITH PROPOFOL N/A 01/23/2018   Procedure: ESOPHAGOGASTRODUODENOSCOPY (EGD) WITH PROPOFOL;  Surgeon: Ronald Lobo, MD;  Location: [place];  Service: Endoscopy;  Laterality: N/A;  . FACIAL NERVE DECOMPRESSION  2001/2002   bells palsy   . LAPAROSCOPIC CHOLECYSTECTOMY  06/15/2011    Dr Dalbert Batman  . TEE  WITHOUT CARDIOVERSION N/A 12/17/2013   Procedure: TRANSESOPHAGEAL ECHOCARDIOGRAM (TEE);  Surgeon: Pixie Casino, MD;  Location: [facility] ENDOSCOPY;  Service: Cardiovascular;  Laterality: N/A;  trish/ja  . TRANSTHORACIC ECHOCARDIOGRAM  3/55/7322   LV systolic function normal with mild conc LVH; LA mildly dilated; trace MR/TR  . UPPER GI ENDOSCOPY  2009   negative     A IV Location/Drains/Wounds Patient Lines/Drains/Airways Status   Active Line/Drains/Airways    Name:   Placement date:   Placement time:   Site:   Days:   Peripheral IV 12/30/18 Left Hand   12/30/18    0247    Hand   less than 1          Intake/Output Last 24 hours No intake or output data in the 24 hours ending 12/30/18 0307  Labs/Imaging Results for orders placed or performed during the hospital encounter of 12/29/18 (from the past 48 hour(s))  Basic metabolic panel     Status: Abnormal   Collection Time: 12/29/18  9:52 PM  Result Value Ref Range   Sodium 121 (L) 135 - 145 mmol/L   Potassium 5.1 3.5 - 5.1 mmol/L   Chloride 89 (L) 98 - 111 mmol/L   CO2 22 22 - 32 mmol/L   Glucose, Bld 155 (H) 70 - 99 mg/dL   BUN 15 8 - 23 mg/dL   Creatinine, Ser 0.91 0.44 - 1.00 mg/dL   Calcium 8.9 8.9 - 10.3 mg/dL   GFR calc non Af Amer >60 >60 mL/min   GFR calc Af Amer >60 >60 mL/min   Anion gap 10 5 - 15    Comment: Performed at [facility], [place] [address] [place], [place] [address]  CBC     Status: Abnormal   Collection Time: 12/29/18  9:52 PM  Result Value Ref Range   WBC 9.8 4.0 - 10.5 K/uL   RBC 3.46 (L) 3.87 - 5.11 MIL/uL   Hemoglobin 8.0 (L) 12.0 - 15.0 g/dL    Comment: Reticulocyte Hemoglobin testing may be clinically indicated, consider ordering this additional test [id number]    HCT 25.7 (L) 36.0 - 46.0 %   MCV 74.3 (L) 80.0 - 100.0 fL   MCH 23.1 (L) 26.0 - 34.0 pg   MCHC 31.1 30.0 - 36.0 g/dL   RDW 16.4 (H) 11.5 - 15.5 %   Platelets 279 150 - 400 K/uL   nRBC 0.0 0.0 - 0.2 %    Comment: Performed at  [facility], [place] [address], [place], [place] [address]  Troponin I - ONCE - STAT     Status: None   Collection Time: 12/29/18  9:52 PM  Result Value Ref Range   Troponin I <0.03 <0.03 ng/mL    Comment: Performed at [facility], [place] [address], [place], [place] [address]  Protime-INR (order if Patient is taking Coumadin / Warfarin)     Status: Abnormal   Collection Time: 12/29/18  9:52 PM  Result  Value Ref Range   Prothrombin Time 21.8 (H) 11.4 - 15.2 seconds   INR 1.9 (H) 0.8 - 1.2    Comment: (NOTE) INR goal varies based on device and disease states. Performed at [facility], [place] [address], [place], [place] [address]   Brain natriuretic peptide     Status: Abnormal   Collection Time: 12/29/18  9:52 PM  Result Value Ref Range   B Natriuretic Peptide 277.4 (H) 0.0 - 100.0 pg/mL    Comment: Performed at [facility] [address], [place], [place] [address]  POC occult blood, ED Provider will collect     Status: None   Collection Time: 12/30/18  2:04 AM  Result Value Ref Range   Fecal Occult Bld NEGATIVE NEGATIVE  Type and screen [place]     Status: None (Preliminary result)   Collection Time: 12/30/18  2:12 AM  Result Value Ref Range   ABO/RH(D) PENDING    Antibody Screen PENDING    Sample Expiration      01/02/2019,2359 Performed at [facility], [place] [address], [place],  [address]    Dg Chest 2 View  Result Date: 12/29/2018 CLINICAL DATA:  68 year old female with shortness of breath and swelling. EXAM: CHEST - 2 VIEW COMPARISON:  12/04/2017 and earlier. FINDINGS: Stable cardiac size at the upper limits of normal to mildly enlarged. Calcified aortic atherosclerosis. Other mediastinal contours are within normal limits. Visualized tracheal air column is within normal limits. Mild acute on chronic increased interstitial markings in both lungs, mild basilar predominance. No pleural fluid, pneumothorax or consolidation.  No acute osseous abnormality identified. Paucity of bowel gas in the upper abdomen. IMPRESSION: Mild acute pulmonary interstitial opacity. Consider mild or developing interstitial edema versus viral/atypical respiratory infection. No pleural fluid. Electronically Signed   By: Genevie Ann M.D.   On: 12/29/2018 23:26    Pending Labs Unresulted Labs (From admission, onward)    Start     Ordered   12/30/18 0158  SARS Coronavirus 2 [facility] order, Performed in [place] hospital lab)  (Novel Coronavirus, [facility] Order))  Once,   R     12/30/18 0159          Vitals/Pain Today's Vitals   12/30/18 0057 12/30/18 0145 12/30/18 0245 12/30/18 0252  BP:   127/79   Pulse:  77    Resp:  20 18   Temp:      TempSrc:      SpO2:  94%    Weight: 93.4 kg     Height: 5\' 2"  (1.575 m)     PainSc:    0-No pain    Isolation Precautions No active isolations  Medications Medications  furosemide (LASIX) injection 40 mg (has no administration in time range)    Mobility walks     Focused Assessments Cardiac Assessment Handoff:  Cardiac Rhythm: Atrial fibrillation Lab Results  Component Value Date   TROPONINI <0.03 12/29/2018   Lab Results  Component Value Date   DDIMER 0.23 11/27/2018   Does the Patient currently have chest pain? No     R Recommendations: See Admitting Provider Note  Report given to:   Additional Notes:

## 2018-12-30 NOTE — TOC Initial Note (Signed)
Transition of Care [facility]) - Initial/Assessment Note    Patient Details  Name: [name] MRN: [id number] Date of Birth: 07/13/1951  [facility]) CM/SW Contact:    Sherrilyn Rist Phone Number: [phone] 12/30/2018, 3:22 PM  Clinical Narrative:                 Patient lives at home with spouse; PCP: Hoyt Koch, MD; has private insurance with [employer] with prescription drug coverage;CM will continue to follow for progression of care.  Expected Discharge Plan: Home/Self Care Barriers to Discharge: No Barriers Identified   Patient Goals and CMS Choice     Choice offered to / list presented to : NA  Expected Discharge Plan and Services Expected Discharge Plan: Home/Self Care In-house Referral: [facility] Discharge Planning Services: CM Consult Post Acute Care Choice: NA Living arrangements for the past 2 months: Single Family Home                 DME Arranged: N/A DME Agency: NA       HH Arranged: NA Friend Agency: NA        Prior Living Arrangements/Services Living arrangements for the past 2 months: Single Family Home Lives with:: Spouse Patient language and need for interpreter reviewed:: Yes        Need for Family Participation in Patient Care: No (Comment) Care giver support system in place?: Yes (comment)   Criminal Activity/Legal Involvement Pertinent to Current Situation/Hospitalization: No - Comment as needed  Activities of Daily Living Home Assistive Devices/Equipment: None ADL Screening (condition at time of admission) Patient's cognitive ability adequate to safely complete daily activities?: Yes Is the patient deaf or have difficulty hearing?: No Does the patient have difficulty seeing, even when wearing glasses/contacts?: No Does the patient have difficulty concentrating, remembering, or making decisions?: No Patient able to express need for assistance with ADLs?: Yes Does the patient have difficulty  dressing or bathing?: No Independently performs ADLs?: Yes (appropriate for developmental age) Does the patient have difficulty walking or climbing stairs?: No Weakness of Legs: None Weakness of Arms/Hands: None  Permission Sought/Granted                  Emotional Assessment       Orientation: : Oriented to Self, Oriented to  Time, Oriented to Place, Oriented to Situation Alcohol / Substance Use: Not Applicable Psych Involvement: No (comment)  Admission diagnosis:  congestive heart failure  Patient Active Problem List   Diagnosis Date Noted  . Hyponatremia 12/30/2018  . Acute on chronic diastolic CHF (congestive heart failure) ([place]) 12/30/2018  . Superficial phlebitis of leg, left 11/27/2018  . Stasis dermatitis of both legs 11/27/2018  . Upper respiratory infection 10/11/2018  . Permanent atrial fibrillation 06/10/2018  . Arthritis 10/25/2017  . COPD mixed type ([place]) 10/25/2017  . Fibrocystic disease of breast 10/25/2017  . Gastroesophageal reflux disease 10/25/2017  . Glaucoma 10/25/2017  . 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 Heart murmur 10/25/2017  . Insomnia 10/25/2017  . Osteopenia 10/25/2017  . RLS (restless legs syndrome) 04/23/2017  . Routine general medical examination at a health care facility 11/16/2016  . Diabetic polyneuropathy ([place]) 07/30/2016  . Depression 01/04/2016  . Obesity (BMI 30.0-34.9) 10/11/2015  . CAD S/P percutaneous coronary angioplasty 04/28/2013  . Essential hypertension 05/06/2009  . LOW BACK PAIN, CHRONIC 02/12/2008  . Microcytic anemia 08/15/2007  . Type 2 diabetes mellitus with vascular disease ([place]) 01/02/2007  . Hyperlipidemia associated with type 2  diabetes mellitus ([place]) 01/02/2007  . ELEVATION, TRANSAMINASE/LDH LEVELS 01/02/2007   PCP:  Hoyt Koch, MD Pharmacy:   [place], [place] [address] Phone: [phone] Fax:  [phone]  [facility] - [place], [place] [address] Phone: [phone] Fax: [phone]     Social Determinants of Health (SDOH) Interventions    Readmission Risk Interventions No flowsheet data found.

## 2018-12-30 NOTE — H&P (Signed)
History and Physical    [name]:[id number] DOB: [birth year] DOA: 12/29/2018  PCP: Hoyt Koch, MD   Patient coming from: Home   Chief Complaint: SOB, leg swelling   HPI: [name] is a 68 y.o. female with medical history significant for type 2 diabetes mellitus, atrial fibrillation on Xarelto, coronary artery disease, COPD, chronic back pain, and history of GI bleeding, now presenting to the emergency department for evaluation of shortness of breath, leg swelling, and outpatient chest x-ray concerning for heart failure.  Patient reports that she developed shortness of breath with bilateral leg edema and 20 pound weight gain a couple months ago, was evaluated by her cardiologist for this and was taking increased dose of Lasix.  She initially lost 5 pounds with increased Lasix, but had persistent leg swelling and dyspnea that did not respond to additional outpatient diuresis.  She developed hyponatremia to the mid 120s with diuresis.  She reports that her stool has been darker for the past week or so but she denies any gross bleeding or abdominal discomfort.  She was prescribed lifelong daily PPI after an upper GI bleed a year ago, but has not been taking that.  She takes Celebrex, Xarelto, and full dose aspirin daily.  Denies chest pain or palpitations and denies fevers or chills.  ED Course: Upon arrival to the ED, patient is found to be afebrile, saturating well on room air, and with vitals otherwise stable.  EKG features atrial fibrillation and chest x-ray is notable for mild acute pulmonary interstitial opacities concerning for interstitial edema versus atypical infection.  Chemistry panel is notable for sodium of 121 and CBC features a microcytic anemia with hemoglobin 8.0.  Fecal occult blood testing is negative.  Patient was given 40 mg IV Lasix in the ED.  She will be observed for ongoing evaluation and management.  Review of Systems:  All other systems reviewed and  apart from HPI, are negative.  Past Medical History:  Diagnosis Date  . Anemia, unspecified   . Arthritis   . Asthma   . Atrial fibrillation with RVR ([place])    a. on Xarelto  . Bell's palsy    Facial nerve decompression in 2001  . Chronic low back pain   . COPD with asthma ([place])   . Coronary artery disease    Myoview 04/12/11 was entirely normal. ECHO 02/26/08 showed only minor abnormalities. Stenting 05/26/08 of her posterolateral branch to the left circumflex coronary artery. Used a 2.5x28mm Taxus Monorail stent.myoview 2014 was without ischemia  . Diabetes mellitus    Type 2  . Early cataracts, bilateral   . Fatty liver   . GERD (gastroesophageal reflux disease)   . Glaucoma   . Glaucoma   . Goiter   . Heart murmur   . History of nuclear stress test 2012; 2014   lexiscan; normal pattern of perfusion; normal, low risk scan   . Hyperlipidemia   . Hypertension   . Panic disorder   . Pneumonia 2008  . Polycystic ovary disease    Hysterectomy in 1982 for this  . Shortness of breath dyspnea    ECHO 02/26/08 showed only minor abnormalities  . Spinal stenosis     Past Surgical History:  Procedure Laterality Date  . ABDOMINAL HYSTERECTOMY  1982   & BSO; for polycystic ovary disease  . CARDIOVERSION N/A 12/17/2013   Procedure: CARDIOVERSION;  Surgeon: Pixie Casino, MD;  Location: [place];  Service: Cardiovascular;  Laterality: N/A;  .  CARDIOVERSION N/A 09/30/2015   Procedure: CARDIOVERSION;  Surgeon: Dorothy Spark, MD;  Location: [facility] ENDOSCOPY;  Service: Cardiovascular;  Laterality: N/A;  . CARDIOVERSION N/A 06/22/2016   Procedure: CARDIOVERSION;  Surgeon: Skeet Latch, MD;  Location: [place];  Service: Cardiovascular;  Laterality: N/A;  . COLONOSCOPY     last 2009; Dr Cristina Gong; due 2019  . COLONOSCOPY WITH PROPOFOL N/A 01/23/2018   Procedure: COLONOSCOPY WITH PROPOFOL;  Surgeon: Ronald Lobo, MD;  Location: [place];  Service: Endoscopy;  Laterality:  N/A;  . CORONARY ANGIOPLASTY  05/26/2008   Stenting of her posterolateral branch to the left circumflex coronary artery. Used a 2.5x5mm Taxus Monorail stent.  . ESOPHAGOGASTRODUODENOSCOPY (EGD) WITH PROPOFOL N/A 01/23/2018   Procedure: ESOPHAGOGASTRODUODENOSCOPY (EGD) WITH PROPOFOL;  Surgeon: Ronald Lobo, MD;  Location: [place];  Service: Endoscopy;  Laterality: N/A;  . FACIAL NERVE DECOMPRESSION  2001/2002   bells palsy   . LAPAROSCOPIC CHOLECYSTECTOMY  06/15/2011    Dr Dalbert Batman  . TEE WITHOUT CARDIOVERSION N/A 12/17/2013   Procedure: TRANSESOPHAGEAL ECHOCARDIOGRAM (TEE);  Surgeon: Pixie Casino, MD;  Location: [facility] ENDOSCOPY;  Service: Cardiovascular;  Laterality: N/A;  trish/ja  . TRANSTHORACIC ECHOCARDIOGRAM  5/63/1497   LV systolic function normal with mild conc LVH; LA mildly dilated; trace MR/TR  . UPPER GI ENDOSCOPY  2009   negative     reports that she quit smoking about 25 years ago. Her smoking use included cigarettes. She has a 45.00 pack-year smoking history. She has never used smokeless tobacco. She reports that she does not drink alcohol or use drugs.  Allergies  Allergen Reactions  . Benadryl [Diphenhydramine Hcl] Other (See Comments)    Restless leg  . Clopidogrel Bisulfate Nausea Only    Nausea & pain  . Contrast Media [Iodinated Diagnostic Agents] Palpitations    Rapid heart rate, hot    Family History  Problem Relation Age of Onset  . Heart attack Father 55       2nd MI at 63  . Colon cancer Brother 38  . Gout Brother   . Ulcers Mother   . Emphysema Mother 8  . Colon polyps Sister   . Cancer Sister        Basal cell carcinoma  . Pneumonia Maternal Grandmother   . Hypertension Brother   . Hyperlipidemia Brother   . Cancer Brother        Skin  . Diabetes Neg Hx   . Stroke Neg Hx      Prior to Admission medications   Medication Sig Start Date End Date Taking? Authorizing Provider  albuterol (VENTOLIN HFA) 108 (90 Base) MCG/ACT inhaler Inhale  1-2 puffs into the lungs every 6 (six) hours as needed for wheezing or shortness of breath.    [provider]  ascorbic acid (VITAMIN C) 1000 MG tablet Take 1,000 mg by mouth 2 (two) times daily.     [provider]  aspirin 325 MG tablet Take 325 mg by mouth daily.    [provider]  aspirin EC 81 MG tablet Take 1 tablet (81 mg total) by mouth daily. Patient taking differently: Take 325 mg by mouth daily.  06/10/18   Erlene Quan, PA-C  budesonide-formoterol (SYMBICORT) 160-4.5 MCG/ACT inhaler USE 2 PUFFS TWICE DAILY Patient taking differently: Inhale 2 puffs into the lungs 2 (two) times daily as needed (asthma). USE 2 PUFFS TWICE DAILY AS NEEDED 10/28/17   Hoyt Koch, MD  CALCIUM-MAG-VIT C-VIT D PO Take 1 tablet by  mouth daily.     [provider]  celecoxib (CELEBREX) 200 MG capsule Take 200 mg by mouth 2 (two) times daily.  12/30/13   [provider]  Continuous Blood Gluc Sensor ([place]) MISC Use to monitor sugars 04/23/17   Hoyt Koch, MD  diclofenac sodium (VOLTAREN) 1 % GEL Apply 4 times daily Patient not taking: Reported on 12/25/2018 08/01/18   Wallene Huh, DPM  diltiazem (CARDIZEM CD) 180 MG 24 hr capsule TAKE 1 CAPSULE DAILY. 12/15/18   Lorretta Harp, MD  gabapentin (NEURONTIN) 100 MG capsule TAKE (1) CAPSULE THREE TIMES DAILY. Patient taking differently: 100 mg. May take 100 mg capsule 2-3 times a day 10/14/18   Hoyt Koch, MD  guaiFENesin (MUCINEX) 600 MG 12 hr tablet Take 600 mg by mouth 2 (two) times daily. As needed for cough    [provider]  INVOKANA 100 MG TABS tablet TAKE 1 TABLET ONCE DAILY. 09/22/18   Hoyt Koch, MD  isosorbide mononitrate (IMDUR) 30 MG 24 hr tablet Take 0.5 tablets (15 mg total) by mouth daily. 04/03/18   Lorretta Harp, MD  latanoprost (XALATAN) 0.005 % ophthalmic solution Place 1 drop into both eyes at bedtime. 12/09/13   [provider]  levalbuterol Penne Lash) 0.63 MG/3ML nebulizer solution Take 3 mLs (0.63 mg total) by nebulization every 6 (six) hours as needed for wheezing or shortness of breath. Patient not taking: Reported on 11/27/2018 07/02/16   Shawnee Knapp, MD  LORazepam (ATIVAN) 0.5 MG tablet TAKE 1 OR 2 TABLETS AT BEDTIME. 10/14/18   Hoyt Koch, MD  Melatonin 5 MG TABS Take 2 tablets by mouth at bedtime. Take 10 mg at bedtime    [provider]  metoprolol succinate (TOPROL-XL) 25 MG 24 hr tablet TAKE ONE TABLET AT BEDTIME. Patient taking differently: as needed. Take as needed for palpitations 12/02/18   Sherran Needs, NP  metoprolol succinate (TOPROL-XL) 50 MG 24 hr tablet 100 mg. Take 100 mg in the mornings 12/14/18   [provider]  Multiple Vitamins-Iron (MULTIVITAMINS WITH IRON) TABS Take 1 tablet by mouth daily.     [provider]  nystatin ointment (MYCOSTATIN) Apply 1 application topically 2 (two) times daily as needed (IRRITATION).  07/09/16   [provider]  omeprazole (PRILOSEC) 20 MG capsule Take 2 capsules (40 mg total) by mouth daily. 01/23/18   Buccini, Herbie [place], MD  [facility] DELICA LANCETS FINE MISC 1 Units by Does not apply route as directed. 10/05/11   Midge Minium, MD  PARoxetine (PAXIL) 10 MG tablet TAKE 2 AND 1/2 TABS DAILY 10/13/18   Hoyt Koch, MD  quinapril (ACCUPRIL) 40 MG tablet TAKE 1 TABLET ONCE DAILY. 09/18/18   Hoyt Koch, MD  rOPINIRole (REQUIP XL) 2 MG 24 hr tablet TAKE ONE TABLET AT BEDTIME. 07/24/18   Dohmeier, Asencion Partridge, MD  rOPINIRole (REQUIP) 0.5 MG tablet Take 1 to 2 tab at midnight. ( XL form at 6 PM) 06/30/18   Dohmeier, Asencion Partridge, MD  rosuvastatin (CRESTOR) 10 MG tablet TAKE ONE TABLET AT BEDTIME. 10/21/18   Lorretta Harp, MD  senna (SENOKOT) 8.6 MG TABS Take 1 tablet by mouth daily.     [provider]  sitaGLIPtin-metformin (JANUMET) 50-1000 MG tablet TAKE 1/2 TABLET IN THE MORNING WITH  BREAKFAST AND 1 TABLET IN THE EVENING WITH SUPPER. 11/17/18   Hoyt Koch, MD  Tiotropium Bromide-Olodaterol (STIOLTO RESPIMAT) 2.5-2.5 MCG/ACT AERS  Inhale 2 puffs into the lungs daily. 12/01/18   Baird Lyons D, MD  torsemide (DEMADEX) 20 MG tablet Take 1 tablet (20 mg total) by mouth daily. 12/10/18   Lorretta Harp, MD  XARELTO 20 MG TABS tablet TAKE 1 TABLET ONCE DAILY WITH SUPPER. 11/25/18   Lorretta Harp, MD  XOPENEX HFA 45 MCG/ACT inhaler USE 1 TO 2 PUFFS EVERY 6 HOURS AS NEEDED FOR WHEEZING. Patient not taking: Reported on 11/27/2018 03/23/16   Hoyt Koch, MD    Physical Exam: Vitals:   12/29/18 2146 12/30/18 0057 12/30/18 0145 12/30/18 0245  BP:    127/79  Pulse:   77   Resp:   20 18  Temp:      TempSrc:      SpO2:   94%   Weight: 93 kg 93.4 kg    Height: 5\' 2"  (1.575 m) 5\' 2"  (1.575 m)      Constitutional: NAD, calm  Eyes: PERTLA, lids and conjunctivae normal ENMT: Mucous membranes are moist. Posterior pharynx clear of any exudate or lesions.   Neck: normal, supple, no masses, no thyromegaly Respiratory: no wheezing, no rhonchi. No accessory muscle use.  Cardiovascular: Rate ~80 and irregular. 2+ pretibial pitting edema. Abdomen: No distension, no tenderness, soft. Bowel sounds normal.  Musculoskeletal: no clubbing / cyanosis. No joint deformity upper and lower extremities.    Skin: no significant rashes, lesions, ulcers. Warm, dry, well-perfused. Neurologic: CN 2-12 grossly intact. Sensation intact. Strength 5/5 in all 4 limbs.  Psychiatric: Alert and oriented x 3. Calm, cooperative.    Labs on Admission: I have personally reviewed following labs and imaging studies  CBC: Recent Labs  Lab 12/29/18 2152  WBC 9.8  HGB 8.0*  HCT 25.7*  MCV 74.3*  PLT 062   Basic Metabolic Panel: Recent Labs  Lab 12/29/18 2152  NA 121*  K 5.1  CL 89*  CO2 22  GLUCOSE 155*  BUN 15  CREATININE 0.91  CALCIUM 8.9   GFR: Estimated Creatinine  Clearance: 61.2 mL/min (by C-G formula based on SCr of 0.91 mg/dL). Liver Function Tests: No results for input(s): AST, ALT, ALKPHOS, BILITOT, PROT, ALBUMIN in the last 168 hours. No results for input(s): LIPASE, AMYLASE in the last 168 hours. No results for input(s): AMMONIA in the last 168 hours. Coagulation Profile: Recent Labs  Lab 12/29/18 2152  INR 1.9*   Cardiac Enzymes: Recent Labs  Lab 12/29/18 2152  TROPONINI <0.03   BNP (last 3 results) Recent Labs    11/27/18 1547  PROBNP 219.0*   HbA1C: No results for input(s): HGBA1C in the last 72 hours. CBG: No results for input(s): GLUCAP in the last 168 hours. Lipid Profile: No results for input(s): CHOL, HDL, LDLCALC, TRIG, CHOLHDL, LDLDIRECT in the last 72 hours. Thyroid Function Tests: No results for input(s): TSH, T4TOTAL, FREET4, T3FREE, THYROIDAB in the last 72 hours. Anemia Panel: No results for input(s): VITAMINB12, FOLATE, FERRITIN, TIBC, IRON, RETICCTPCT in the last 72 hours. Urine analysis:    Component Value Date/Time   COLORURINE STRAW (A) 01/03/2018 2142   APPEARANCEUR CLEAR 01/03/2018 2142   LABSPEC 1.001 (L) 01/03/2018 2142   PHURINE 5.0 01/03/2018 2142   GLUCOSEU >=500 (A) 01/03/2018 2142   HGBUR SMALL (A) 01/03/2018 2142   HGBUR negative 05/18/2010 [place] 10/02/2018 [place] 01/03/2018 2142   PROTEINUR Positive (A) 10/02/2018 1202   PROTEINUR NEGATIVE 01/03/2018 2142   UROBILINOGEN 0.2 10/02/2018 1202  UROBILINOGEN 0.2 06/11/2011 1215   NITRITE pos 10/02/2018 1202   NITRITE NEGATIVE 01/03/2018 2142   LEUKOCYTESUR Negative 10/02/2018 1202   Sepsis Labs: @LABRCNTIP (procalcitonin:4,lacticidven:4) )No results found for this or any previous visit (from the past 240 hour(s)).   Radiological Exams on Admission: Dg Chest 2 View  Result Date: 12/29/2018 CLINICAL DATA:  68 year old female with shortness of breath and swelling. EXAM: CHEST - 2 VIEW COMPARISON:   12/04/2017 and earlier. FINDINGS: Stable cardiac size at the upper limits of normal to mildly enlarged. Calcified aortic atherosclerosis. Other mediastinal contours are within normal limits. Visualized tracheal air column is within normal limits. Mild acute on chronic increased interstitial markings in both lungs, mild basilar predominance. No pleural fluid, pneumothorax or consolidation. No acute osseous abnormality identified. Paucity of bowel gas in the upper abdomen. IMPRESSION: Mild acute pulmonary interstitial opacity. Consider mild or developing interstitial edema versus viral/atypical respiratory infection. No pleural fluid. Electronically Signed   By: Genevie Ann M.D.   On: 12/29/2018 23:26    EKG: Independently reviewed. Atrial fibrillation.   Assessment/Plan   1. Acute on chronic diastolic CHF  - Presents with ~3 months of leg swelling and SOB, not improved with increased diuretic as outpatient  - Treated with Lasix 40 mg IV in ED  - Continue diuresis with Lasix 40 mg IV q12h, continue ACE-i and beta-blocker, follow daily wt and I/O's, monitor renal function and electrolytes during diuresis    2. Microcytic anemia  - Hgb is 8.0 on admission, down from 11.5 in February  - She reports ~1 wk of dark stools, denies gross blood or abdominal pain  - FOBT is negative in ED  - Likely slow/intermittent upper GI bleeding in setting of Celebrex, ASA, and Xarelto use; had been recommended to take omeprazole 40 mg indefinitely after UGIB one year ago but has not been taking  - Resume daily PPI, check anemia panel, trend H&H   3. Hyponatremia  - Serum sodium is 121 on admission, similar to recent priors and without symptoms  - Likely secondary to CHF/diuretics - Monitor closely during diuresis   4. Atrial fibrillation  - In rate-controlled a fib on admission  - CHADS-VASc is at least 48 (age x2, gender, CHF, DM)  - Continue Xarelto, diltiazem, and metoprolol    5. CAD - No anginal complaints   - Continue statin, beta-blocker, and ASA    6. Type II DM  - A1c was 6.5% in February  - Managed at home with [place], held on admission  - Use SSI with Novolog while in hospital   7. COPD  - No cough or wheezing on admission  - Continue ICS/LABA and as-needed albuterol     PPE: Mask, face shield  DVT prophylaxis: Xarelto  Code Status: Full  Family Communication: Discussed with patient  Consults called: none  Admission status: Observation     Vianne Bulls, MD [facility] Pager [phone]  If 7PM-7AM, please contact night-coverage [email or web address] Password [facility]  12/30/2018, 3:14 AM

## 2018-12-30 NOTE — ED Notes (Signed)
Pt's s/o, Baker Janus, contacted and informed of the pt's condition and status.

## 2018-12-30 NOTE — Progress Notes (Signed)
Patient admitted after midnight, please see H&P.  Says she only got a few hours of sleep last night and is very tired.  Here with SOB/leg swelling.  Found to have a low Na-- started on IV lasix and fluid restriction.  Close monitoring of her Na level.  Urine osmo/serum osmos ordered.  Labs in AM Strict I/Os Daily weights  [name]

## 2018-12-30 NOTE — ED Provider Notes (Signed)
[facility] EMERGENCY DEPARTMENT Provider Note   CSN: [id number] Arrival date & time: 12/29/18  2125    History   Chief Complaint Chief Complaint  Patient presents with  . Chest Pain    HPI [name] is a 68 y.o. female.   The history is provided by the patient.  She has history of hypertension, diabetes, hyperlipidemia, atrial fibrillation anticoagulated on rivaroxaban, COPD, coronary artery disease and was told to come to the ED because of chest x-ray showed signs of heart failure.  She has been having leg swelling for the last 3 months.  She has been given furosemide and torsemide which have not been effective.  Over the last 3 days, she has noted dyspnea which is worse with exertion.  She has not had paroxysmal nocturnal dyspnea, but she sleeps almost sitting up in a recliner.  She has noted some intermittent chest tightness with exertion, but this does not seem to be getting worse.  She denies nausea, vomiting, diaphoresis.  She has noted stools have been darker than normal.  She does take celecoxib on a daily basis.  1 year ago, she had endoscopy which showed a possible bleeding site in the stomach and she was put on omeprazole.  She has noted that she gets tired much easier and feels like her legs do not have any strength.  Of note, she denies any contact with anyone who has COVID-19.  Past Medical History:  Diagnosis Date  . Anemia, unspecified   . Arthritis   . Asthma   . Atrial fibrillation with RVR ([place])    a. on Xarelto  . Bell's palsy    Facial nerve decompression in 2001  . Chronic low back pain   . COPD with asthma ([place])   . Coronary artery disease    Myoview 04/12/11 was entirely normal. ECHO 02/26/08 showed only minor abnormalities. Stenting 05/26/08 of her posterolateral branch to the left circumflex coronary artery. Used a 2.5x11mm Taxus Monorail stent.myoview 2014 was without ischemia  . Diabetes mellitus    Type 2  . Early cataracts,  bilateral   . Fatty liver   . GERD (gastroesophageal reflux disease)   . Glaucoma   . Glaucoma   . Goiter   . Heart murmur   . History of nuclear stress test 2012; 2014   lexiscan; normal pattern of perfusion; normal, low risk scan   . Hyperlipidemia   . Hypertension   . Panic disorder   . Pneumonia 2008  . Polycystic ovary disease    Hysterectomy in 1982 for this  . Shortness of breath dyspnea    ECHO 02/26/08 showed only minor abnormalities  . Spinal stenosis     Patient Active Problem List   Diagnosis Date Noted  . Superficial phlebitis of leg, left 11/27/2018  . Stasis dermatitis of both legs 11/27/2018  . Upper respiratory infection 10/11/2018  . Permanent atrial fibrillation 06/10/2018  . Arthritis 10/25/2017  . COPD mixed type ([place]) 10/25/2017  . Fibrocystic disease of breast 10/25/2017  . Gastroesophageal reflux disease 10/25/2017  . Glaucoma 10/25/2017  . 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 Heart murmur 10/25/2017  . Insomnia 10/25/2017  . Osteopenia 10/25/2017  . RLS (restless legs syndrome) 04/23/2017  . Routine general medical examination at a health care facility 11/16/2016  . Diabetic polyneuropathy ([place]) 07/30/2016  . Depression 01/04/2016  . Obesity (BMI 30.0-34.9) 10/11/2015  . CAD S/P percutaneous coronary angioplasty 04/28/2013  . Essential hypertension 05/06/2009  . LOW BACK  PAIN, CHRONIC 02/12/2008  . Anemia 08/15/2007  . Type 2 diabetes mellitus with vascular disease ([place]) 01/02/2007  . Hyperlipidemia associated with type 2 diabetes mellitus ([place]) 01/02/2007  . ELEVATION, TRANSAMINASE/LDH LEVELS 01/02/2007    Past Surgical History:  Procedure Laterality Date  . ABDOMINAL HYSTERECTOMY  1982   & BSO; for polycystic ovary disease  . CARDIOVERSION N/A 12/17/2013   Procedure: CARDIOVERSION;  Surgeon: Pixie Casino, MD;  Location: [facility] ENDOSCOPY;  Service: Cardiovascular;  Laterality: N/A;  . CARDIOVERSION N/A 09/30/2015   Procedure: CARDIOVERSION;  Surgeon:  Dorothy Spark, MD;  Location: [facility] ENDOSCOPY;  Service: Cardiovascular;  Laterality: N/A;  . CARDIOVERSION N/A 06/22/2016   Procedure: CARDIOVERSION;  Surgeon: Skeet Latch, MD;  Location: [facility];  Service: Cardiovascular;  Laterality: N/A;  . COLONOSCOPY     last 2009; Dr Cristina Gong; due 2019  . COLONOSCOPY WITH PROPOFOL N/A 01/23/2018   Procedure: COLONOSCOPY WITH PROPOFOL;  Surgeon: Ronald Lobo, MD;  Location: [place];  Service: Endoscopy;  Laterality: N/A;  . CORONARY ANGIOPLASTY  05/26/2008   Stenting of her posterolateral branch to the left circumflex coronary artery. Used a 2.5x29mm Taxus Monorail stent.  . ESOPHAGOGASTRODUODENOSCOPY (EGD) WITH PROPOFOL N/A 01/23/2018   Procedure: ESOPHAGOGASTRODUODENOSCOPY (EGD) WITH PROPOFOL;  Surgeon: Ronald Lobo, MD;  Location: [place];  Service: Endoscopy;  Laterality: N/A;  . FACIAL NERVE DECOMPRESSION  2001/2002   bells palsy   . LAPAROSCOPIC CHOLECYSTECTOMY  06/15/2011    Dr Dalbert Batman  . TEE WITHOUT CARDIOVERSION N/A 12/17/2013   Procedure: TRANSESOPHAGEAL ECHOCARDIOGRAM (TEE);  Surgeon: Pixie Casino, MD;  Location[facility] ENDOSCOPY;  Service: Cardiovascular;  Laterality: N/A;  trish/ja  . TRANSTHORACIC ECHOCARDIOGRAM  11/19/8117   LV systolic function normal with mild conc LVH; LA mildly dilated; trace MR/TR  . UPPER GI ENDOSCOPY  2009   negative     OB History   No obstetric history on file.      Home Medications    Prior to Admission medications   Medication Sig Start Date End Date Taking? Authorizing Provider  albuterol (VENTOLIN HFA) 108 (90 Base) MCG/ACT inhaler Inhale 1-2 puffs into the lungs every 6 (six) hours as needed for wheezing or shortness of breath.    [provider]  ascorbic acid (VITAMIN C) 1000 MG tablet Take 1,000 mg by mouth 2 (two) times daily.     [provider]  aspirin 325 MG tablet Take 325 mg by mouth daily.    [provider]  aspirin EC 81 MG tablet Take 1  tablet (81 mg total) by mouth daily. Patient taking differently: Take 325 mg by mouth daily.  06/10/18   Erlene Quan, PA-C  budesonide-formoterol (SYMBICORT) 160-4.5 MCG/ACT inhaler USE 2 PUFFS TWICE DAILY Patient taking differently: Inhale 2 puffs into the lungs 2 (two) times daily as needed (asthma). USE 2 PUFFS TWICE DAILY AS NEEDED 10/28/17   Hoyt Koch, MD  CALCIUM-MAG-VIT C-VIT D PO Take 1 tablet by mouth daily.     [provider]  celecoxib (CELEBREX) 200 MG capsule Take 200 mg by mouth 2 (two) times daily.  12/30/13   [provider]  Continuous Blood Gluc Sensor ([place]) MISC Use to monitor sugars 04/23/17   Hoyt Koch, MD  diclofenac sodium (VOLTAREN) 1 % GEL Apply 4 times daily Patient not taking: Reported on 12/25/2018 08/01/18   Wallene Huh, DPM  diltiazem (CARDIZEM CD) 180 MG 24 hr capsule TAKE 1 CAPSULE DAILY. 12/15/18  Lorretta Harp, MD  gabapentin (NEURONTIN) 100 MG capsule TAKE (1) CAPSULE THREE TIMES DAILY. Patient taking differently: 100 mg. May take 100 mg capsule 2-3 times a day 10/14/18   Hoyt Koch, MD  guaiFENesin (MUCINEX) 600 MG 12 hr tablet Take 600 mg by mouth 2 (two) times daily. As needed for cough    [provider]  INVOKANA 100 MG TABS tablet TAKE 1 TABLET ONCE DAILY. 09/22/18   Hoyt Koch, MD  isosorbide mononitrate (IMDUR) 30 MG 24 hr tablet Take 0.5 tablets (15 mg total) by mouth daily. 04/03/18   Lorretta Harp, MD  latanoprost (XALATAN) 0.005 % ophthalmic solution Place 1 drop into both eyes at bedtime. 12/09/13   [provider]  levalbuterol Penne Lash) 0.63 MG/3ML nebulizer solution Take 3 mLs (0.63 mg total) by nebulization every 6 (six) hours as needed for wheezing or shortness of breath. Patient not taking: Reported on 11/27/2018 07/02/16   Shawnee Knapp, MD  LORazepam (ATIVAN) 0.5 MG tablet TAKE 1 OR 2 TABLETS AT BEDTIME. 10/14/18   Hoyt Koch, MD  Melatonin 5 MG TABS Take 2 tablets by mouth at bedtime. Take 10 mg at bedtime    [provider]  metoprolol succinate (TOPROL-XL) 25 MG 24 hr tablet TAKE ONE TABLET AT BEDTIME. Patient taking differently: as needed. Take as needed for palpitations 12/02/18   Sherran Needs, NP  metoprolol succinate (TOPROL-XL) 50 MG 24 hr tablet 100 mg. Take 100 mg in the mornings 12/14/18   [provider]  Multiple Vitamins-Iron (MULTIVITAMINS WITH IRON) TABS Take 1 tablet by mouth daily.     [provider]  nystatin ointment (MYCOSTATIN) Apply 1 application topically 2 (two) times daily as needed (IRRITATION).  07/09/16   [provider]  omeprazole (PRILOSEC) 20 MG capsule Take 2 capsules (40 mg total) by mouth daily. 01/23/18   Buccini, Herbie [place], MD  [facility] DELICA LANCETS FINE MISC 1 Units by Does not apply route as directed. 10/05/11   Midge Minium, MD  PARoxetine (PAXIL) 10 MG tablet TAKE 2 AND 1/2 TABS DAILY 10/13/18   Hoyt Koch, MD  quinapril (ACCUPRIL) 40 MG tablet TAKE 1 TABLET ONCE DAILY. 09/18/18   Hoyt Koch, MD  rOPINIRole (REQUIP XL) 2 MG 24 hr tablet TAKE ONE TABLET AT BEDTIME. 07/24/18   Dohmeier, Asencion Partridge, MD  rOPINIRole (REQUIP) 0.5 MG tablet Take 1 to 2 tab at midnight. ( XL form at 6 PM) 06/30/18   Dohmeier, Asencion Partridge, MD  rosuvastatin (CRESTOR) 10 MG tablet TAKE ONE TABLET AT BEDTIME. 10/21/18   Lorretta Harp, MD  senna (SENOKOT) 8.6 MG TABS Take 1 tablet by mouth daily.     [provider]  sitaGLIPtin-metformin (JANUMET) 50-1000 MG tablet TAKE 1/2 TABLET IN THE MORNING WITH BREAKFAST AND 1 TABLET IN THE EVENING WITH SUPPER. 11/17/18   Hoyt Koch, MD  Tiotropium Bromide-Olodaterol (STIOLTO RESPIMAT) 2.5-2.5 MCG/ACT AERS Inhale 2 puffs into the lungs daily. 12/01/18   Baird Lyons D, MD  torsemide (DEMADEX) 20 MG tablet Take 1 tablet (20 mg total) by mouth daily. 12/10/18   Lorretta Harp, MD  XARELTO 20 MG  TABS tablet TAKE 1 TABLET ONCE DAILY WITH SUPPER. 11/25/18   Lorretta Harp, MD  XOPENEX HFA 45 MCG/ACT inhaler USE 1 TO 2 PUFFS EVERY 6 HOURS AS NEEDED FOR WHEEZING. Patient not taking: Reported on 11/27/2018 03/23/16   Hoyt Koch, MD    Family History  Family History  Problem Relation Age of Onset  . Heart attack Father 79       2nd MI at 63  . Colon cancer Brother 11  . Gout Brother   . Ulcers Mother   . Emphysema Mother 18  . Colon polyps Sister   . Cancer Sister        Basal cell carcinoma  . Pneumonia Maternal Grandmother   . Hypertension Brother   . Hyperlipidemia Brother   . Cancer Brother        Skin  . Diabetes Neg Hx   . Stroke Neg Hx     Social History Social History   Tobacco Use  . Smoking status: Former Smoker    Packs/day: 1.50    Years: 30.00    Pack years: 45.00    Types: Cigarettes    Last attempt to quit: 08/13/1993    Years since quitting: 25.3  . Smokeless tobacco: Never Used  Substance Use Topics  . Alcohol use: No  . Drug use: No     Allergies   Benadryl [diphenhydramine hcl]; Clopidogrel bisulfate; and Contrast media [iodinated diagnostic agents]   Review of Systems Review of Systems  All other systems reviewed and are negative.    Physical Exam Updated Vital Signs BP 109/66 (BP Location: Left Arm)   Pulse 78   Temp 98.6 F (37 C) (Oral)   Resp 19   Ht 5\' 2"  (1.575 m)   Wt 93.4 kg   SpO2 100%   BMI 37.68 kg/m   Physical Exam Vitals signs and nursing note reviewed.    68 year old female, resting comfortably and in no acute distress. Vital signs are normal. Oxygen saturation is 100%, which is normal. Head is normocephalic and atraumatic. PERRLA, EOMI. Oropharynx is clear. Neck is nontender and supple without adenopathy or JVD. Back is nontender and there is no CVA tenderness. Lungs are clear without rales, wheezes, or rhonchi. Chest is nontender. Heart has an irregular rhythm without murmur. Abdomen is soft,  flat, nontender without masses or hepatosplenomegaly and peristalsis is normoactive. Rectal: Normal sphincter tone.  Stool is normal color. Extremities have 2+ edema, full range of motion is present. Skin is warm and dry without rash. Neurologic: Mental status is normal, cranial nerves are intact, there are no motor or sensory deficits.  ED Treatments / Results  Labs (all labs ordered are listed, but only abnormal results are displayed) Labs Reviewed  BASIC METABOLIC PANEL - Abnormal; Notable for the following components:      Result Value   Sodium 121 (*)    Chloride 89 (*)    Glucose, Bld 155 (*)    All other components within normal limits  CBC - Abnormal; Notable for the following components:   RBC 3.46 (*)    Hemoglobin 8.0 (*)    HCT 25.7 (*)    MCV 74.3 (*)    MCH 23.1 (*)    RDW 16.4 (*)    All other components within normal limits  PROTIME-INR - Abnormal; Notable for the following components:   Prothrombin Time 21.8 (*)    INR 1.9 (*)    All other components within normal limits  BRAIN NATRIURETIC PEPTIDE - Abnormal; Notable for the following components:   B Natriuretic Peptide 277.4 (*)    All other components within normal limits  CBC - Abnormal; Notable for the following components:   RBC 3.50 (*)    Hemoglobin 8.0 (*)    HCT 25.9 (*)  MCV 74.0 (*)    MCH 22.9 (*)    RDW 16.5 (*)    All other components within normal limits  IRON AND TIBC - Abnormal; Notable for the following components:   Iron 21 (*)    TIBC 606 (*)    Saturation Ratios 3 (*)    All other components within normal limits  RETICULOCYTES - Abnormal; Notable for the following components:   Retic Ct Pct 3.6 (*)    RBC. 3.50 (*)    Immature Retic Fract 33.6 (*)    All other components within normal limits  GLUCOSE, CAPILLARY - Abnormal; Notable for the following components:   Glucose-Capillary 149 (*)    All other components within normal limits  SARS CORONAVIRUS 2 (HOSPITAL ORDER,  [facility])  TROPONIN I  VITAMIN B12  FOLATE  FERRITIN  HIV ANTIBODY (ROUTINE TESTING W REFLEX)  POC OCCULT BLOOD, ED  TYPE AND SCREEN  ABO/RH    EKG EKG Interpretation  Date/Time:  Monday Dec 29 2018 21:40:11 EDT Ventricular Rate:  73 PR Interval:    QRS Duration: 78 QT Interval:  390 QTC Calculation: 429 R Axis:   97 Text Interpretation:  Atrial fibrillation Rightward axis Low voltage QRS Abnormal ECG When compared with ECG of 04/01/2018, No significant change was found Confirmed by Delora Fuel ([id number]) on 12/30/2018 1:17:13 AM   Radiology Dg Chest 2 View  Result Date: 12/29/2018 CLINICAL DATA:  68 year old female with shortness of breath and swelling. EXAM: CHEST - 2 VIEW COMPARISON:  12/04/2017 and earlier. FINDINGS: Stable cardiac size at the upper limits of normal to mildly enlarged. Calcified aortic atherosclerosis. Other mediastinal contours are within normal limits. Visualized tracheal air column is within normal limits. Mild acute on chronic increased interstitial markings in both lungs, mild basilar predominance. No pleural fluid, pneumothorax or consolidation. No acute osseous abnormality identified. Paucity of bowel gas in the upper abdomen. IMPRESSION: Mild acute pulmonary interstitial opacity. Consider mild or developing interstitial edema versus viral/atypical respiratory infection. No pleural fluid. Electronically Signed   By: Genevie Ann M.D.   On: 12/29/2018 23:26    Procedures Procedures   Medications Ordered in ED Medications - No data to display   Initial Impression / Assessment and Plan / ED Course  I have reviewed the triage vital signs and the nursing notes.  Pertinent labs & imaging results that were available during my care of the patient were reviewed by me and considered in my medical decision making (see chart for details).  Leg edema which appears to be heart failure.  Chest x-ray here does show evidence for heart failure.   ECG shows atrial fibrillation and is unchanged from prior.  Electrolytes show sodium of 121.  She has had significant hyponatremia since April 21, and today's value is only slightly lower than on May 11.  Troponin is normal.  Hemoglobin today is 8.0 compared with 11.5 on February 20.  Stool was sent for Hemoccult testing.  I suspect she has recurrent GI blood loss and heart failure secondary to anemia.  Hemoccult was negative, but I still believe that she has been having GI blood loss.  BNP is elevated consistent with heart failure which, again, I feel is triggered or exacerbated by her anemia.  Case is discussed with Dr. Myna Hidalgo of [facility] hospitalist, who agrees to admit the patient.  Final Clinical Impressions(s) / ED Diagnoses   Final diagnoses:  Acute on chronic diastolic heart failure (HCC)  Normochromic  normocytic anemia  Hyponatremia    ED Discharge Orders    None       Delora Fuel, MD 80/97/04 [phone]

## 2018-12-30 NOTE — Progress Notes (Signed)
Patient arrived to the unit.  No complaints of pain.  VSS.  Placed on telemetry and educated on call bell.

## 2018-12-31 LAB — BASIC METABOLIC PANEL
Anion gap: 9 (ref 5–15)
Anion gap: 11 (ref 5–15)
BUN: 12 mg/dL (ref 8–23)
BUN: 10 mg/dL (ref 8–23)
CO2: 24 mmol/L (ref 22–32)
CO2: 25 mmol/L (ref 22–32)
Calcium: 8.9 mg/dL (ref 8.9–10.3)
Calcium: 8.9 mg/dL (ref 8.9–10.3)
Chloride: 89 mmol/L — ABNORMAL LOW (ref 98–111)
Chloride: 89 mmol/L — ABNORMAL LOW (ref 98–111)
Creatinine, Ser: 0.83 mg/dL (ref 0.44–1.00)
Creatinine, Ser: 0.74 mg/dL (ref 0.44–1.00)
GFR calc Af Amer: >60 mL/min (ref >60)
GFR calc Af Amer: >60 mL/min (ref >60)
GFR calc non Af Amer: >60 mL/min (ref >60)
GFR calc non Af Amer: >60 mL/min (ref >60)
Glucose, Bld: 143 mg/dL — ABNORMAL HIGH (ref 70–99)
Glucose, Bld: 149 mg/dL — ABNORMAL HIGH (ref 70–99)
Potassium: 4.1 mmol/L (ref 3.5–5.1)
Potassium: 4.1 mmol/L (ref 3.5–5.1)
Sodium: 122 mmol/L — ABNORMAL LOW (ref 135–145)
Sodium: 125 mmol/L — ABNORMAL LOW (ref 135–145)

## 2018-12-31 LAB — CBC
HCT: 24 % — ABNORMAL LOW (ref 36.0–46.0)
Hemoglobin: 7.6 g/dL — ABNORMAL LOW (ref 12.0–15.0)
MCH: 23.2 pg — ABNORMAL LOW (ref 26.0–34.0)
MCHC: 31.7 g/dL (ref 30.0–36.0)
MCV: 73.4 fL — ABNORMAL LOW (ref 80.0–100.0)
Platelets: 255 10*3/uL (ref 150–400)
RBC: 3.27 MIL/uL — ABNORMAL LOW (ref 3.87–5.11)
RDW: 16.6 % — ABNORMAL HIGH (ref 11.5–15.5)
WBC: 7.1 10*3/uL (ref 4.0–10.5)
nRBC: 0 % (ref 0.0–0.2)

## 2018-12-31 LAB — GLUCOSE, CAPILLARY
Glucose-Capillary: 143 mg/dL — ABNORMAL HIGH (ref 70–99)
Glucose-Capillary: 149 mg/dL — ABNORMAL HIGH (ref 70–99)
Glucose-Capillary: 168 mg/dL — ABNORMAL HIGH (ref 70–99)

## 2018-12-31 LAB — OSMOLALITY: Osmolality: 255 mOsm/kg — ABNORMAL LOW (ref 275–295)

## 2018-12-31 MED ORDER — METOPROLOL SUCCINATE ER 50 MG PO TB24
75.0000 mg | ORAL_TABLET | Freq: Every day | ORAL | Status: DC
  Administered 2018-12-31: 75 mg via ORAL
  Filled 2018-12-31: qty 1

## 2018-12-31 MED ORDER — TORSEMIDE 20 MG PO TABS: 40.0000 mg | ORAL_TABLET | Freq: Every day | ORAL | 3 refills | Status: DC

## 2018-12-31 MED ORDER — OMEPRAZOLE 20 MG PO CPDR: 20.0000 mg | DELAYED_RELEASE_CAPSULE | Freq: Two times a day (BID) | ORAL | Status: DC

## 2018-12-31 NOTE — Progress Notes (Signed)
CCMD notified RN that patient had 2.44sec pause.  Patient is asymptomatic and resting.  VSS.  [facility] notified.

## 2018-12-31 NOTE — Progress Notes (Signed)
RN reviewed heart failure packet with patient.  Patient is very knowledgeable about the information and very eager to do apply the education when she is discharged home.  Patient really wants to be discharged today even if it is late.  RN explained to patient she would have to discuss with primary provider.

## 2018-12-31 NOTE — Discharge Summary (Signed)
Physician Discharge Summary  [name] OHY:[id number] DOB: June 28, 1951 DOA: 12/29/2018  PCP: Hoyt Koch, MD  Admit date: 12/29/2018 Discharge date: 12/31/2018  Admitted From: home Discharge disposition: home   Recommendations for Outpatient Follow-Up:   1. BMP on Friday 2. Daily weights 3. Fluid restriction of 1500 mls/day 4  CBC 1-2 weeks (was given IV Fe on 5/19)   Discharge Diagnosis:   Principal Problem:   Acute on chronic diastolic CHF (congestive heart failure) (HCC) Active Problems:   Type 2 diabetes mellitus with vascular disease (HCC)   Microcytic anemia   CAD S/P percutaneous coronary angioplasty   COPD mixed type (HCC)   Permanent atrial fibrillation   Hyponatremia    Discharge Condition: Improved.  Diet recommendation: Low sodium, heart healthy.  Carbohydrate-modified.  Wound care: None.  Code status: Full.   History of Present Illness:   [name] is a 68 y.o. female with medical history significant for type 2 diabetes mellitus, atrial fibrillation on Xarelto, coronary artery disease, COPD, chronic back pain, and history of GI bleeding, now presenting to the emergency department for evaluation of shortness of breath, leg swelling, and outpatient chest x-ray concerning for heart failure.  Patient reports that she developed shortness of breath with bilateral leg edema and 20 pound weight gain a couple months ago, was evaluated by her cardiologist for this and was taking increased dose of Lasix.  She initially lost 5 pounds with increased Lasix, but had persistent leg swelling and dyspnea that did not respond to additional outpatient diuresis.  She developed hyponatremia to the mid 120s with diuresis.  She reports that her stool has been darker for the past week or so but she denies any gross bleeding or abdominal discomfort.  She was prescribed lifelong daily PPI after an upper GI bleed a year ago, but has not been taking that.  She  takes Celebrex, Xarelto, and full dose aspirin daily.  Denies chest pain or palpitations and denies fevers or chills.   Hospital Course by Problem:   Acute on chronic diastolic CHF  - Presents with ~3 months of leg swelling and SOB, not improved with increased diuretic as outpatient  - Treated with Lasix 40 mg IV in ED  - Continued diuresis with Lasix 40 mg IV q12h with good response-- down 4.2L and nurse reports down 7 lbs  -patient is breathing/walking/feeling much better -has been under a tremendous amount of stress with new move -I think she could use another day of IV lasix but patient is very anxious to go home.  We spoke for > 45 minutes regarding her fluid intake/restriction, her labs (including low Na), and the difference between IV diuretics and PO diuretics but she is very anxious to be discharged- " I would rather come back to the ER if I need to then to stay another night".  Plan for close follow up with her PCP for routine labs.  I will work to facilitate the safest discharge possible.  Patient instructed to fluid restrict/weigh daily and return to the ER with change of symptoms.  She felt the torosemide worked the best so will resume that med-- may need to be titrated based on fluid/labs  Microcytic anemia  - Hgb is 8.0 on admission, down from 11.5 in February  - FOBT is negative in ED  - Likely slow/intermittent upper GI bleeding in setting of Celebrex, ASA, and Xarelto use; had been recommended to take omeprazole 40 mg indefinitely  after UGIB one year ago but has not been taking  - Resume daily PPI -transfuse IV Fe -cbc 1 week  Hyponatremia  -trending up with diuresis and fluid restriction- -Osmos low -urine na: 58    Atrial fibrillation  - In rate-controlled a fib on admission  - CHADS-VASc is at least 33 (age x2, gender, CHF, DM)  - Continue Xarelto, diltiazem, and metoprolol    CAD - No anginal complaints  - Continue statin, beta-blocker, and ASA    Type II  DM  - A1c was 6.5% in February  -resume home meds  COPD  - No cough or wheezing on admission  - Continue ICS/LABA and as-needed albuterol       Medical Consultants:      Discharge Exam:   Vitals:   12/31/18 0837 12/31/18 1241  BP: 90/68 140/72  Pulse: 67 75  Resp: 20 20  Temp: 98.6 F (37 C) 98.7 F (37.1 C)  SpO2: 96% 100%   Vitals:   12/31/18 0300 12/31/18 0357 12/31/18 0837 12/31/18 1241  BP:  (!) 111/54 90/68 140/72  Pulse:  68 67 75  Resp:  16 20 20   Temp:  97.7 F (36.5 C) 98.6 F (37 C) 98.7 F (37.1 C)  TempSrc:  Oral Oral Oral  SpO2:  91% 96% 100%  Weight: 94 kg     Height:        General exam: Appears calm and comfortable. Swelling has lessened and she is up walking around    The results of significant diagnostics from this hospitalization (including imaging, microbiology, ancillary and laboratory) are listed below for reference.     Procedures and Diagnostic Studies:   Dg Chest 2 View  Result Date: 12/29/2018 CLINICAL DATA:  68 year old female with shortness of breath and swelling. EXAM: CHEST - 2 VIEW COMPARISON:  12/04/2017 and earlier. FINDINGS: Stable cardiac size at the upper limits of normal to mildly enlarged. Calcified aortic atherosclerosis. Other mediastinal contours are within normal limits. Visualized tracheal air column is within normal limits. Mild acute on chronic increased interstitial markings in both lungs, mild basilar predominance. No pleural fluid, pneumothorax or consolidation. No acute osseous abnormality identified. Paucity of bowel gas in the upper abdomen. IMPRESSION: Mild acute pulmonary interstitial opacity. Consider mild or developing interstitial edema versus viral/atypical respiratory infection. No pleural fluid. Electronically Signed   By: Genevie Ann M.D.   On: 12/29/2018 23:26     Labs:   Basic Metabolic Panel: Recent Labs  Lab 12/29/18 2152 12/30/18 0753 12/31/18 0426 12/31/18 1439  NA 121* 122* 122* 125*    K 5.1 4.2 4.1 4.1  CL 89* 89* 89* 89*  CO2 22 23 24 25   GLUCOSE 155* 133* 143* 149*  BUN 15 14 12 10   CREATININE 0.91 0.90 0.83 0.74  CALCIUM 8.9 9.0 8.9 8.9   GFR Estimated Creatinine Clearance: 69.9 mL/min (by C-G formula based on SCr of 0.74 mg/dL). Liver Function Tests: No results for input(s): AST, ALT, ALKPHOS, BILITOT, PROT, ALBUMIN in the last 168 hours. No results for input(s): LIPASE, AMYLASE in the last 168 hours. No results for input(s): AMMONIA in the last 168 hours. Coagulation profile Recent Labs  Lab 12/29/18 2152  INR 1.9*    CBC: Recent Labs  Lab 12/29/18 2152 12/30/18 0338 12/31/18 0426  WBC 9.8 9.9 7.1  HGB 8.0* 8.0* 7.6*  HCT 25.7* 25.9* 24.0*  MCV 74.3* 74.0* 73.4*  PLT 279 290 255   Cardiac Enzymes: Recent Labs  Lab 12/29/18 2152  TROPONINI <0.03   BNP: Invalid input(s): POCBNP CBG: Recent Labs  Lab 12/30/18 1615 12/30/18 2158 12/31/18 0550 12/31/18 1109 12/31/18 1558  GLUCAP 121* 133* 143* 149* 168*   D-Dimer No results for input(s): DDIMER in the last 72 hours. Hgb A1c No results for input(s): HGBA1C in the last 72 hours. Lipid Profile No results for input(s): CHOL, HDL, LDLCALC, TRIG, CHOLHDL, LDLDIRECT in the last 72 hours. Thyroid function studies No results for input(s): TSH, T4TOTAL, T3FREE, THYROIDAB in the last 72 hours.  Invalid input(s): FREET3 Anemia work up Recent Labs    12/30/18 0338  VITAMINB12 655  FOLATE 37.6  FERRITIN 20  TIBC 606*  IRON 21*  RETICCTPCT 3.6*   Microbiology Recent Results (from the past 240 hour(s))  SARS Coronavirus 2 [facility] order, Performed in [place] hospital lab)     Status: None   Collection Time: 12/30/18  2:35 AM  Result Value Ref Range Status   SARS Coronavirus 2 NEGATIVE NEGATIVE Final    Comment: (NOTE) If result is NEGATIVE SARS-CoV-2 target nucleic acids are NOT DETECTED. The SARS-CoV-2 RNA is generally detectable in upper and lower  respiratory specimens  during the acute phase of infection. The lowest  concentration of SARS-CoV-2 viral copies this assay can detect is 250  copies / mL. A negative result does not preclude SARS-CoV-2 infection  and should not be used as the sole basis for treatment or other  patient management decisions.  A negative result may occur with  improper specimen collection / handling, submission of specimen other  than nasopharyngeal swab, presence of viral mutation(s) within the  areas targeted by this assay, and inadequate number of viral copies  (<250 copies / mL). A negative result must be combined with clinical  observations, patient history, and epidemiological information. If result is POSITIVE SARS-CoV-2 target nucleic acids are DETECTED. The SARS-CoV-2 RNA is generally detectable in upper and lower  respiratory specimens dur ing the acute phase of infection.  Positive  results are indicative of active infection with SARS-CoV-2.  Clinical  correlation with patient history and other diagnostic information is  necessary to determine patient infection status.  Positive results do  not rule out bacterial infection or co-infection with other viruses. If result is PRESUMPTIVE POSTIVE SARS-CoV-2 nucleic acids MAY BE PRESENT.   A presumptive positive result was obtained on the submitted specimen  and confirmed on repeat testing.  While 2019 novel coronavirus  (SARS-CoV-2) nucleic acids may be present in the submitted sample  additional confirmatory testing may be necessary for epidemiological  and / or clinical management purposes  to differentiate between  SARS-CoV-2 and other Sarbecovirus currently known to infect humans.  If clinically indicated additional testing with an alternate test  methodology [phone]) is advised. The SARS-CoV-2 RNA is generally  detectable in upper and lower respiratory sp ecimens during the acute  phase of infection. The expected result is Negative. Fact Sheet for Patients:   StrictlyIdeas.no Fact Sheet for Healthcare Providers: BankingDealers.co.za This test is not yet approved or cleared by the [place] FDA and has been authorized for detection and/or diagnosis of SARS-CoV-2 by FDA under an Emergency Use Authorization (EUA).  This EUA will remain in effect (meaning this test can be used) for the duration of the COVID-19 declaration under Section 564(b)(1) of the Act, 21 U.S.C. section 360bbb-3(b)(1), unless the authorization is terminated or revoked sooner. Performed at [facility], [place] [address] [place], [place] [address]  Discharge Instructions:   Discharge Instructions    (HEART FAILURE PATIENTS) Call MD:  Anytime you have any of the following symptoms: 1) 3 pound weight gain in 24 hours or 5 pounds in 1 week 2) shortness of breath, with or without a dry hacking cough 3) swelling in the hands, feet or stomach 4) if you have to sleep on extra pillows at night in order to breathe.   Complete by:  As directed    Diet - low sodium heart healthy   Complete by:  As directed    Diet Carb Modified   Complete by:  As directed    Discharge instructions   Complete by:  As directed    Weight daily BMP on Friday FLUID RESTRICT to 1500 MLs Your sodium is slowly improving-- low Sodium can be dangerous-- it is important to get your labs checked and to fluid restrict   Increase activity slowly   Complete by:  As directed      Allergies as of 12/31/2018      Reactions   Benadryl [diphenhydramine Hcl] Other (See Comments)   Restless leg   Clopidogrel Bisulfate Nausea Only   Nausea & pain   Contrast Media [iodinated Diagnostic Agents] Palpitations   Rapid heart rate, hot      Medication List    STOP taking these medications   aspirin 325 MG tablet   aspirin EC 81 MG tablet   budesonide-formoterol 160-4.5 MCG/ACT inhaler Commonly known as:  Symbicort   celecoxib 200 MG capsule Commonly  known as:  CELEBREX   levalbuterol 0.63 MG/3ML nebulizer solution Commonly known as:  XOPENEX   Xopenex HFA 45 MCG/ACT inhaler Generic drug:  levalbuterol     TAKE these medications   albuterol 108 (90 Base) MCG/ACT inhaler Commonly known as:  VENTOLIN HFA Inhale 1-2 puffs into the lungs every 6 (six) hours as needed for wheezing or shortness of breath.   ascorbic acid 1000 MG tablet Commonly known as:  VITAMIN C Take 1,000 mg by mouth 2 (two) times daily.   CALCIUM-MAG-VIT C-VIT D PO Take 1 tablet by mouth daily.   diclofenac sodium 1 % Gel Commonly known as:  VOLTAREN Apply 4 times daily   diltiazem 180 MG 24 hr capsule Commonly known as:  CARDIZEM CD TAKE 1 CAPSULE DAILY.   FreeStyle Optician, dispensing Use to monitor sugars   gabapentin 100 MG capsule Commonly known as:  NEURONTIN TAKE (1) CAPSULE THREE TIMES DAILY. What changed:  See the new instructions.   guaiFENesin 600 MG 12 hr tablet Commonly known as:  MUCINEX Take 600 mg by mouth 2 (two) times daily. As needed for cough   HYDROcodone-acetaminophen 5-325 MG tablet Commonly known as:  NORCO/VICODIN Take 1 tablet by mouth at bedtime.   Invokana 100 MG Tabs tablet Generic drug:  canagliflozin TAKE 1 TABLET ONCE DAILY. What changed:    how much to take  when to take this   isosorbide mononitrate 30 MG 24 hr tablet Commonly known as:  IMDUR Take 0.5 tablets (15 mg total) by mouth daily.   latanoprost 0.005 % ophthalmic solution Commonly known as:  XALATAN Place 1 drop into both eyes at bedtime.   LORazepam 0.5 MG tablet Commonly known as:  ATIVAN TAKE 1 OR 2 TABLETS AT BEDTIME. What changed:  See the new instructions.   Melatonin 5 MG Tabs Take 2 tablets by mouth at bedtime. Take 10 mg at bedtime   metoprolol succinate 25 MG 24  hr tablet Commonly known as:  TOPROL-XL TAKE ONE TABLET AT BEDTIME. What changed:    when to take this  reasons to take this  additional instructions     metoprolol succinate 50 MG 24 hr tablet Commonly known as:  TOPROL-XL 100 mg. Take 100 mg in the mornings What changed:  Another medication with the same name was changed. Make sure you understand how and when to take each.   multivitamins with iron Tabs tablet Take 1 tablet by mouth daily.   nystatin ointment Commonly known as:  MYCOSTATIN Apply 1 application topically 2 (two) times daily as needed (IRRITATION).   omeprazole 20 MG capsule Commonly known as:  PRILOSEC Take 1 capsule (20 mg total) by mouth 2 (two) times daily before a meal.   OneTouch Delica Lancets Fine Misc 1 Units by Does not apply route as directed.   PARoxetine 10 MG tablet Commonly known as:  PAXIL TAKE 2 AND 1/2 TABS DAILY What changed:  See the new instructions.   quinapril 40 MG tablet Commonly known as:  ACCUPRIL TAKE 1 TABLET ONCE DAILY. What changed:  when to take this   rOPINIRole 0.5 MG tablet Commonly known as:  REQUIP Take 1 to 2 tab at midnight. ( XL form at 6 PM) What changed:    how much to take  how to take this  when to take this  reasons to take this  additional instructions   rOPINIRole 2 MG 24 hr tablet Commonly known as:  REQUIP XL TAKE ONE TABLET AT BEDTIME. What changed:  Another medication with the same name was changed. Make sure you understand how and when to take each.   rosuvastatin 10 MG tablet Commonly known as:  CRESTOR TAKE ONE TABLET AT BEDTIME. What changed:  when to take this   senna 8.6 MG Tabs tablet Commonly known as:  SENOKOT Take 1 tablet by mouth at bedtime.   sitaGLIPtin-metformin 50-1000 MG tablet Commonly known as:  Janumet TAKE 1/2 TABLET IN THE MORNING WITH BREAKFAST AND 1 TABLET IN THE EVENING WITH SUPPER. What changed:    how much to take  how to take this  when to take this   Tiotropium Bromide-Olodaterol 2.5-2.5 MCG/ACT Aers Commonly known as:  Stiolto Respimat Inhale 2 puffs into the lungs daily.   torsemide 20 MG  tablet Commonly known as:  DEMADEX Take 2 tablets (40 mg total) by mouth daily. What changed:  how much to take   Xarelto 20 MG Tabs tablet Generic drug:  rivaroxaban TAKE 1 TABLET ONCE DAILY WITH SUPPER. What changed:  See the new instructions.      Follow-up Information    Hoyt Koch, MD Follow up.   Specialty:  Internal Medicine Why:  BMP on Friday Contact information: [address] [place] [address] [phone]        Lorretta Harp, MD .   Specialties:  Cardiology, Radiology Contact information: [address] [place]  [address] [phone]            Time coordinating discharge: 25 min  Signed:  Geradine Girt DO  [facility] 12/31/2018, 4:00 PM

## 2019-01-01 ENCOUNTER — Telehealth: Payer: Self-pay | Admitting: Internal Medicine

## 2019-01-01 ENCOUNTER — Telehealth: Payer: Self-pay | Admitting: *Deleted

## 2019-01-01 DIAGNOSIS — I5033 Acute on chronic diastolic (congestive) heart failure: Secondary | ICD-10-CM

## 2019-01-01 NOTE — Telephone Encounter (Signed)
Called patient and scheduled a phone visit. She said that the doctor she saw in the hospital would like for her to have a BMP done. Can you order these labs?

## 2019-01-01 NOTE — Telephone Encounter (Signed)
I would probably recommend telephone visit but if insistent for in person needs first thing in the morning appointment.

## 2019-01-01 NOTE — Telephone Encounter (Signed)
Called pt concerning hosp f/u appt/ Pt states she spoke w/someone already and dr. Sharlet Morrison is doing to call her since she is not able to do an virtual appt. Verified chart pt was contacted by carson and telephone visit made for tomorrow 01/02/19. Completed TCM call below.[name]  Transition Care Management Follow-up Telephone Call   Date discharged? 12/31/18   How have you been since you were released from the hospital? Pt states she is doing alright   Do you understand why you were in the hospital? YES   Do you understand the discharge instructions? YES   Where were you discharged to? Home   Items Reviewed:  Medications reviewed: YES, pt states she is not taking her aspirin, symbicort, or celebrex until she f/u w/MD  Allergies reviewed: YES, no changes  Dietary changes reviewed: YES, fluid restriction, low sodium, and heart healthy  Referrals reviewed: YES, appt w/cardiologist next week   Functional Questionnaire:   Activities of Daily Living (ADLs):   She states she are independent in the following: bathing and hygiene, feeding, continence, grooming, toileting and dressing States they require assistance with the following: ambulation   Any transportation issues/concerns?: NO   Any patient concerns? NO   Confirmed importance and date/time of follow-up visits scheduled YES, (telephone) 01/02/19  Provider Appointment booked with Dr. Sharlet Morrison  Confirmed with patient if condition begins to worsen call PCP or go to the ER.  Patient was given the office number and encouraged to call back with question or concerns.  : YES

## 2019-01-01 NOTE — Telephone Encounter (Signed)
I would recommend to wait until Tuesday for labs. Can do visit same day, visit at 840 and then can go to lab after.

## 2019-01-01 NOTE — Telephone Encounter (Signed)
Patient states will do phone call visit tomorrow and come Tuesday for the labs

## 2019-01-01 NOTE — Telephone Encounter (Signed)
Patient is needing to schedule a hospital follow up with you. Based on past appointments, it appears that she is unable to do a virtual visit.  How would you like to proceed with this appointment?

## 2019-01-01 NOTE — Telephone Encounter (Signed)
Copied from [place]. Topic: Appointment Scheduling - Scheduling Inquiry for Clinic >> Dec 31, 2018  4:21 PM Berneta Levins wrote: Reason for CRM:   Renold Don from [facility] calling - pt is being discharged from hospital today.  Pt needs a hospital follow up. Pt needs a call back at [phone]

## 2019-01-02 ENCOUNTER — Ambulatory Visit (INDEPENDENT_AMBULATORY_CARE_PROVIDER_SITE_OTHER): Payer: PPO | Admitting: Internal Medicine

## 2019-01-02 ENCOUNTER — Encounter: Payer: Self-pay | Admitting: Internal Medicine

## 2019-01-02 ENCOUNTER — Other Ambulatory Visit: Payer: Self-pay | Admitting: Internal Medicine

## 2019-01-02 DIAGNOSIS — M1990 Unspecified osteoarthritis, unspecified site: Secondary | ICD-10-CM

## 2019-01-02 DIAGNOSIS — I5033 Acute on chronic diastolic (congestive) heart failure: Secondary | ICD-10-CM

## 2019-01-02 DIAGNOSIS — D509 Iron deficiency anemia, unspecified: Secondary | ICD-10-CM

## 2019-01-02 MED ORDER — CELECOXIB 200 MG PO CAPS: 200.0000 mg | ORAL_CAPSULE | Freq: Two times a day (BID) | ORAL | 3 refills | Status: DC

## 2019-01-02 MED ORDER — HYDROCODONE-ACETAMINOPHEN 5-325 MG PO TABS: 1.0000 | ORAL_TABLET | Freq: Every day | ORAL | 0 refills | Status: DC

## 2019-01-02 NOTE — Assessment & Plan Note (Signed)
Needs CBC and CMP and adjust as needed. Weight is still decreasing well. She understands the importance of salt and fluid restriction.

## 2019-01-02 NOTE — Progress Notes (Addendum)
Virtual Visit via Audio-only Note  I connected with [name] on 01/02/19 at  1:40 PM EDT by an audio enabled telemedicine application and verified that I am speaking with the correct person using two identifiers.  The patient and the provider were at separate locations throughout the entire encounter.   I discussed the limitations of evaluation and management by telemedicine and the availability of in person appointments. The patient expressed understanding and agreed to proceed.  History of Present Illness: The patient is a 68 y.o. female with visit for hospital follow up (in for acute on chronic diastolic heart failure with low sodium and low hg level, IV lasix caused weight loss, denies blood in stool, taking xarelto and celebrex which causes good function and would like to continue, had stopped omeprazole which she is back on now). She is taking torsemide and is down about 10 pounds in the last week since discharge. She is being much more careful about water and sodium intake. She is taking medications as prescribed and enrolling in program through her insurance for more intense home monitoring. Has mild SOB but much improved. Denies fevers or chills or diarrhea or constipation or blood in stool. Overall it is improving. Has tried torsemide. Needs [place] to take over hydrocodone daily rx since her orthopedic is retiring.   Observations/Objective: Voice strong and no SOB, A and O times 3  Assessment and Plan: See problem oriented charting  Follow Up Instructions: refill hydrocodone and can take over filling. Checking CBC and CMP.   Visit time 23 minutes: that time was spent in audio counseling and coordination of care with the patient: counseled about recent stay, diet, chronic health conditions and as above  I discussed the assessment and treatment plan with the patient. The patient was provided an opportunity to ask questions and all were answered. The patient agreed with the plan and  demonstrated an understanding of the instructions.   The patient was advised to call back or seek an in-person evaluation if the symptoms worsen or if the condition fails to improve as anticipated.  Hoyt Koch, MD

## 2019-01-02 NOTE — Assessment & Plan Note (Addendum)
Agreed to hydrocodone/APAP 5/325 #30 per month. She will need visits every 3 months for compliance and will sign contract at follow up visit. [place] narcotic database reviewed and appropriate.

## 2019-01-02 NOTE — Assessment & Plan Note (Signed)
Taking xarelto and continue celebrex. Checking CBC on Tuesday.

## 2019-01-06 ENCOUNTER — Other Ambulatory Visit (INDEPENDENT_AMBULATORY_CARE_PROVIDER_SITE_OTHER): Payer: PPO

## 2019-01-06 ENCOUNTER — Encounter: Payer: Self-pay | Admitting: Cardiovascular Disease

## 2019-01-06 ENCOUNTER — Other Ambulatory Visit: Payer: Self-pay

## 2019-01-06 ENCOUNTER — Ambulatory Visit (INDEPENDENT_AMBULATORY_CARE_PROVIDER_SITE_OTHER): Payer: PPO | Admitting: Cardiovascular Disease

## 2019-01-06 ENCOUNTER — Other Ambulatory Visit: Payer: Self-pay | Admitting: Cardiovascular Disease

## 2019-01-06 VITALS — BP 132/74 | HR 82 | Temp 97.8°F | Ht 62.0 in | Wt 205.0 lb

## 2019-01-06 DIAGNOSIS — I4821 Permanent atrial fibrillation: Secondary | ICD-10-CM

## 2019-01-06 DIAGNOSIS — D509 Iron deficiency anemia, unspecified: Secondary | ICD-10-CM | POA: Diagnosis not present

## 2019-01-06 DIAGNOSIS — Z9861 Coronary angioplasty status: Secondary | ICD-10-CM

## 2019-01-06 DIAGNOSIS — E785 Hyperlipidemia, unspecified: Secondary | ICD-10-CM | POA: Diagnosis not present

## 2019-01-06 DIAGNOSIS — I10 Essential (primary) hypertension: Secondary | ICD-10-CM | POA: Diagnosis not present

## 2019-01-06 DIAGNOSIS — I5033 Acute on chronic diastolic (congestive) heart failure: Secondary | ICD-10-CM | POA: Diagnosis not present

## 2019-01-06 DIAGNOSIS — Z79899 Other long term (current) drug therapy: Secondary | ICD-10-CM

## 2019-01-06 DIAGNOSIS — I2510 Atherosclerotic heart disease of native coronary artery without angina pectoris: Secondary | ICD-10-CM | POA: Diagnosis not present

## 2019-01-06 DIAGNOSIS — E1169 Type 2 diabetes mellitus with other specified complication: Secondary | ICD-10-CM | POA: Diagnosis not present

## 2019-01-06 LAB — CBC
HCT: 25.4 % — ABNORMAL LOW (ref 36.0–46.0)
Hemoglobin: 8.5 g/dL — ABNORMAL LOW (ref 12.0–15.0)
MCHC: 33.3 g/dL (ref 30.0–36.0)
MCV: 75.8 fl — ABNORMAL LOW (ref 78.0–100.0)
Platelets: 293 10*3/uL (ref 150.0–400.0)
RBC: 3.35 Mil/uL — ABNORMAL LOW (ref 3.87–5.11)
RDW: 19.5 % — ABNORMAL HIGH (ref 11.5–15.5)
WBC: 10.3 10*3/uL (ref 4.0–10.5)

## 2019-01-06 LAB — COMPREHENSIVE METABOLIC PANEL
ALT: 11 U/L (ref 0–35)
AST: 16 U/L (ref 0–37)
Albumin: 3.7 g/dL (ref 3.5–5.2)
Alkaline Phosphatase: 69 U/L (ref 39–117)
BUN: 31 mg/dL — ABNORMAL HIGH (ref 6–23)
CO2: 27 mEq/L (ref 19–32)
Calcium: 9 mg/dL (ref 8.4–10.5)
Chloride: 85 mEq/L — ABNORMAL LOW (ref 96–112)
Creatinine, Ser: 1.33 mg/dL — ABNORMAL HIGH (ref 0.40–1.20)
GFR: 39.42 mL/min — ABNORMAL LOW (ref >60.00)
Glucose, Bld: 139 mg/dL — ABNORMAL HIGH (ref 70–99)
Potassium: 4.2 mEq/L (ref 3.5–5.1)
Sodium: 122 mEq/L — ABNORMAL LOW (ref 135–145)
Total Bilirubin: 0.5 mg/dL (ref 0.2–1.2)
Total Protein: 6.9 g/dL (ref 6.0–8.3)

## 2019-01-06 MED ORDER — TORSEMIDE 20 MG PO TABS: 40.0000 mg | ORAL_TABLET | Freq: Two times a day (BID) | ORAL | 3 refills | Status: DC

## 2019-01-06 NOTE — Assessment & Plan Note (Signed)
History of chronic diastolic heart failure admitted for 2 days because of volume overload and shortness of breath with increased peripheral edema.  She underwent diuresis of 4 to 5 L of fluid and was discharged on torsemide.  A 2D echo performed 11/20/2018 revealed preserved LV function with diastolic dysfunction.  She does admit to dietary indiscretion with regards to salt although she is trying to limit this.  She has 2+ pitting edema although she says this is markedly improved compared to when she was admitted several weeks ago.  I am going to increase her torsemide from 40 mg once a day to twice a day, will check a basic metabolic panel in 7 to 10 days and will have her see Almyra Deforest , PA-C in the office in 4 weeks for follow-up.

## 2019-01-06 NOTE — Assessment & Plan Note (Signed)
History of CAD status post distal circumflex and posterior lateral branch intervention by Dr. Melvern Banker 05/26/2008 with normal LV function.  Myoview performed 04/17/2015 was normal.  She denies chest pain

## 2019-01-06 NOTE — Patient Instructions (Addendum)
Medication Instructions:  Your physician has recommended you make the following change in your medication:   INCREASE TORSEMIDE DEMADEX) TO 40 MG BY MOUTH TWICE A DAY. If you need a refill on your cardiac medications before your next appointment, please call your pharmacy.   Lab work: Your physician recommends that you return for lab work in 7-10 days: [place] If you have labs (blood work) drawn today and your tests are completely normal, you will receive your results only by: [name] MyChart Message (if you have MyChart) OR . A paper copy in the mail If you have any lab test that is abnormal or we need to change your treatment, we will call you to review the results.  Testing/Procedures: NONE  Follow-Up: At [facility], you and your health needs are our priority.  As part of our continuing mission to provide you with exceptional heart care, we have created designated Provider Care Teams.  These Care Teams include your primary Cardiologist (physician) and Advanced Practice Providers (APPs -  Physician Assistants and Nurse Practitioners) who all work together to provide you with the care you need, when you need it. You will need a follow up appointment in 4 weeks with Janice Deforest, PA-C and in 6 months with [name].  Please call our office 2 months in advance to schedule this appointment.

## 2019-01-06 NOTE — Telephone Encounter (Signed)
Rx request sent to pharmacy.  

## 2019-01-06 NOTE — Assessment & Plan Note (Signed)
History of persistent atrial fibrillation rate controlled on Xarelto

## 2019-01-06 NOTE — Assessment & Plan Note (Signed)
History of essential hypertension her blood pressure measured today 132/74.  He is on metoprolol quinapril.

## 2019-01-06 NOTE — Progress Notes (Signed)
01/06/2019 HAZELGRACE Morrison   June 14, 1951  [id number]  Primary Physician Hoyt Koch, MD Primary Cardiologist: Lorretta Harp MD [name], [name]  HPI:  [name] is a 68 y.o. moderately overweight divorced Caucasian female, mother of 2 adopted children, ages 97 and 67from [place], who is formerly a patient of Dr. Ky Barban. I saw her in the office 12/11/2017... 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 Her risk factors include former tobacco abuse, having quit 18 years ago, diabetes, hypertension, hyperlipidemia, and a positive family history for heart disease. She has adult-onset asthma as well. Dr. Melvern Banker stented her distal circumflex posterolateral branch, May 26, 2008, with some residual disease in the PDA branch, and normal LV function. Myoview performed May 06, 2008, was not ischemic. I saw her summer of last year, at which time she was complaining of chest pain, for which I prescribed Imdur, which she never filled. I suggested a stress test, which was never obtained. Because of her recurrent chest tightness and increasing dyspnea with radiation to her left neck, I performed cardiac catheterization on her, April 20, 2011, revealing a patent stent, 50% distal PDA lesion, with normal LV function. I thought her disease was stable and had not progressed, and recommended medical therapy. She has had no recurrent chest pain since I last saw her. She has some mild dyspnea. Recent lab work /2/13 revealed a total cholesterol of 129, LDL of 63, and HDL of 45.  She saw Cecilie [name] in the office 05/13/13 with chest pain. A subsequent Myoview stress test was entirely normal. She was admitted to the hospital earlier this month with a good RPR which by history had been on for several weeks but prior. She was symptomatic. She'll underwent transesophageal guided DC cardioversion by Dr. [name] successfully with one shock and was placed on Xarelto oral  anticoagulation. She felt markedly improved clinically. She had a recurrent episode of atrial fibrillation 04/25/14 and underwent DC cardioversion by Dr. [name] in the ER. Her atenolol was changed to Toprol-XL 100 mg. She has seen Roderic Palau in the A. Fib clinic since my last visit with her. She still has paroxysmal A. Fib and is actually in A. Fib today. She is was recently seen in the emergency room for chest pain shortness of breath. At that time she was in sinus rhythm. She ruled out for myocardial infarction. A Myoview stress test recently performed as an outpatient 04/17/15 was normal. The study did not gait because she was in A. Fib at that time Since I saw her back a year ago she was recently hospitalized 2 days because of volume overload and diastolic heart failure.  She was diuresed 4-1/2 L and was sent home on torsemide 40 mg a day.  She has gained some weight back.  She could continues to eat salt although she is trying to limit this.  She has 2+ pitting edema today.   Current Meds  Medication Sig   albuterol (VENTOLIN HFA) 108 (90 Base) MCG/ACT inhaler Inhale 1-2 puffs into the lungs every 6 (six) hours as needed for wheezing or shortness of breath.   ascorbic acid (VITAMIN C) 1000 MG tablet Take 1,000 mg by mouth 2 (two) times daily.    CALCIUM-MAG-VIT C-VIT D PO Take 1 tablet by mouth daily.    celecoxib (CELEBREX) 200 MG capsule Take 1 capsule (200 mg total) by mouth 2 (two) times daily.   Continuous Blood Gluc Sensor (  [place]) MISC Use to monitor sugars   diclofenac sodium (VOLTAREN) 1 % GEL Apply 4 times daily   diltiazem (CARDIZEM CD) 180 MG 24 hr capsule TAKE 1 CAPSULE DAILY.   guaiFENesin (MUCINEX) 600 MG 12 hr tablet Take 600 mg by mouth 2 (two) times daily. As needed for cough   HYDROcodone-acetaminophen (NORCO/VICODIN) 5-325 MG tablet Take 1 tablet by mouth at bedtime.   INVOKANA 100 MG TABS tablet TAKE 1 TABLET ONCE DAILY. (Patient taking  differently: Take 100 mg by mouth daily before breakfast. )   isosorbide mononitrate (IMDUR) 30 MG 24 hr tablet Take 0.5 tablets (15 mg total) by mouth daily.   latanoprost (XALATAN) 0.005 % ophthalmic solution Place 1 drop into both eyes at bedtime.   LORazepam (ATIVAN) 0.5 MG tablet TAKE 1 OR 2 TABLETS AT BEDTIME. (Patient taking differently: Take 0.5-1 mg by mouth at bedtime. )   Melatonin 5 MG TABS Take 2 tablets by mouth at bedtime. Take 10 mg at bedtime   metoprolol succinate (TOPROL-XL) 25 MG 24 hr tablet TAKE ONE TABLET AT BEDTIME. (Patient taking differently: Take 25 mg by mouth as needed. Take as needed for palpitations)   metoprolol succinate (TOPROL-XL) 50 MG 24 hr tablet 100 mg. Take 100 mg in the mornings   Multiple Vitamins-Iron (MULTIVITAMINS WITH IRON) TABS Take 1 tablet by mouth daily.    nystatin ointment (MYCOSTATIN) Apply 1 application topically 2 (two) times daily as needed (IRRITATION).    omeprazole (PRILOSEC) 20 MG capsule Take 1 capsule (20 mg total) by mouth 2 (two) times daily before a meal.   ONETOUCH DELICA LANCETS FINE MISC 1 Units by Does not apply route as directed.   PARoxetine (PAXIL) 10 MG tablet TAKE 2 AND 1/2 TABS DAILY (Patient taking differently: Take 25 mg by mouth daily. )   quinapril (ACCUPRIL) 40 MG tablet TAKE 1 TABLET ONCE DAILY. (Patient taking differently: Take 40 mg by mouth at bedtime. )   rOPINIRole (REQUIP XL) 2 MG 24 hr tablet TAKE ONE TABLET AT BEDTIME. (Patient taking differently: Take 2 mg by mouth at bedtime. )   rOPINIRole (REQUIP) 0.5 MG tablet Take 1 to 2 tab at midnight. ( XL form at 6 PM) (Patient taking differently: Take 0.5-1 mg by mouth daily as needed. Take 1 to 2 tab at midnight or as needed)   rosuvastatin (CRESTOR) 10 MG tablet TAKE ONE TABLET AT BEDTIME. (Patient taking differently: Take 10 mg by mouth daily. )   senna (SENOKOT) 8.6 MG TABS Take 1 tablet by mouth at bedtime.    sitaGLIPtin-metformin (JANUMET)  50-1000 MG tablet TAKE 1/2 TABLET IN THE MORNING WITH BREAKFAST AND 1 TABLET IN THE EVENING WITH SUPPER. (Patient taking differently: Take 0.5-1 tablets by mouth See admin instructions. TAKE 1/2 TABLET IN THE MORNING WITH BREAKFAST AND 1 TABLET IN THE EVENING WITH SUPPER.)   Tiotropium Bromide-Olodaterol (STIOLTO RESPIMAT) 2.5-2.5 MCG/ACT AERS Inhale 2 puffs into the lungs daily.   torsemide (DEMADEX) 20 MG tablet Take 2 tablets (40 mg total) by mouth daily.   XARELTO 20 MG TABS tablet TAKE 1 TABLET ONCE DAILY WITH SUPPER. (Patient taking differently: Take 20 mg by mouth daily with supper. )   [DISCONTINUED] gabapentin (NEURONTIN) 100 MG capsule TAKE (1) CAPSULE THREE TIMES DAILY. (Patient taking differently: 100 mg. May take 100 mg capsule 2-3 times a day)     Allergies  Allergen Reactions   Benadryl [Diphenhydramine Hcl] Other (See Comments)    Restless leg  Clopidogrel Bisulfate Nausea Only    Nausea & pain   Contrast Media [Iodinated Diagnostic Agents] Palpitations    Rapid heart rate, hot    Social History   Socioeconomic History   Marital status: Married    Spouse name: Not on file   Number of children: 2   Years of education: master's   Highest education level: Not on file  Occupational History   Occupation: Electrical engineer    Employer: [employer] resource strain: Not on file   Food insecurity:    Worry: Not on file    Inability: Not on file   Transportation needs:    Medical: Not on file    Non-medical: Not on file  Tobacco Use   Smoking status: Former Smoker    Packs/day: 1.50    Years: 30.00    Pack years: 45.00    Types: Cigarettes    Last attempt to quit: 08/13/1993    Years since quitting: 25.4   Smokeless tobacco: Never Used  Substance and Sexual Activity   Alcohol use: No   Drug use: No   Sexual activity: Not on file  Lifestyle   Physical activity:    Days per week: Not on file    Minutes per  session: Not on file   Stress: Not on file  Relationships   Social connections:    Talks on phone: Not on file    Gets together: Not on file    Attends religious service: Not on file    Active member of club or organization: Not on file    Attends meetings of clubs or organizations: Not on file    Relationship status: Not on file   Intimate partner violence:    Fear of current or ex partner: Not on file    Emotionally abused: Not on file    Physically abused: Not on file    Forced sexual activity: Not on file  Other Topics Concern   Not on file  Social History Narrative   Not on file     Review of Systems: General: negative for chills, fever, night sweats or weight changes.  Cardiovascular: negative for chest pain, dyspnea on exertion, edema, orthopnea, palpitations, paroxysmal nocturnal dyspnea or shortness of breath Dermatological: negative for rash Respiratory: negative for cough or wheezing Urologic: negative for hematuria Abdominal: negative for nausea, vomiting, diarrhea, bright red blood per rectum, melena, or hematemesis Neurologic: negative for visual changes, syncope, or dizziness All other systems reviewed and are otherwise negative except as noted above.    Blood pressure 132/74, pulse 82, temperature 97.8 F (36.6 C), height 5\' 2"  (1.575 m), weight 205 lb (93 kg).  General appearance: alert and no distress Neck: no adenopathy, no carotid bruit, no JVD, supple, symmetrical, trachea midline and thyroid not enlarged, symmetric, no tenderness/mass/nodules Lungs: clear to auscultation bilaterally Heart: irregularly irregular rhythm Extremities: 2+ pitting edema bilaterally Pulses: 2+ and symmetric Skin: Skin color, texture, turgor normal. No rashes or lesions Neurologic: Alert and oriented X 3, normal strength and tone. Normal symmetric reflexes. Normal coordination and gait  EKG not performed today  ASSESSMENT AND PLAN:   Hyperlipidemia associated with  type 2 diabetes mellitus ([place]) History of hyperlipidemia on statin therapy with lipid profile performed 10/02/2018 revealing total cholesterol 118, LDL 42 and HDL 51.  Essential hypertension History of essential hypertension her blood pressure measured today 132/74.  He is on metoprolol quinapril.  CAD S/P percutaneous coronary  angioplasty History of CAD status post distal circumflex and posterior lateral branch intervention by Dr. Melvern Banker 05/26/2008 with normal LV function.  Myoview performed 04/17/2015 was normal.  She denies chest pain  Permanent atrial fibrillation History of persistent atrial fibrillation rate controlled on Xarelto  Acute on chronic diastolic CHF (congestive heart failure) (HCC) History of chronic diastolic heart failure admitted for 2 days because of volume overload and shortness of breath with increased peripheral edema.  She underwent diuresis of 4 to 5 L of fluid and was discharged on torsemide.  A 2D echo performed 11/20/2018 revealed preserved LV function with diastolic dysfunction.  She does admit to dietary indiscretion with regards to salt although she is trying to limit this.  She has 2+ pitting edema although she says this is markedly improved compared to when she was admitted several weeks ago.  I am going to increase her torsemide from 40 mg once a day to twice a day, will check a basic metabolic panel in 7 to 10 days and will have her see Almyra Deforest , PA-C in the office in 4 weeks for follow-up.      Lorretta Harp MD FACP,FACC,FAHA, [facility] 01/06/2019 11:51 AM

## 2019-01-06 NOTE — Assessment & Plan Note (Signed)
History of hyperlipidemia on statin therapy with lipid profile performed 10/02/2018 revealing total cholesterol 118, LDL 42 and HDL 51.

## 2019-01-07 ENCOUNTER — Encounter: Payer: Self-pay | Admitting: Internal Medicine

## 2019-01-11 ENCOUNTER — Emergency Department (HOSPITAL_COMMUNITY): Payer: PPO

## 2019-01-11 ENCOUNTER — Other Ambulatory Visit: Payer: Self-pay

## 2019-01-11 ENCOUNTER — Inpatient Hospital Stay (HOSPITAL_COMMUNITY)
Admission: EM | Admit: 2019-01-11 | Discharge: 2019-01-16 | DRG: 286 | Disposition: A | Discharge status: Home Health | Payer: PPO | Attending: Family Medicine | Admitting: Family Medicine

## 2019-01-11 DIAGNOSIS — R188 Other ascites: Secondary | ICD-10-CM | POA: Diagnosis not present

## 2019-01-11 DIAGNOSIS — E282 Polycystic ovarian syndrome: Secondary | ICD-10-CM | POA: Diagnosis present

## 2019-01-11 DIAGNOSIS — Z6835 Body mass index (BMI) 35.0-35.9, adult: Secondary | ICD-10-CM

## 2019-01-11 DIAGNOSIS — Z9861 Coronary angioplasty status: Secondary | ICD-10-CM

## 2019-01-11 DIAGNOSIS — R001 Bradycardia, unspecified: Secondary | ICD-10-CM | POA: Diagnosis present

## 2019-01-11 DIAGNOSIS — Z825 Family history of asthma and other chronic lower respiratory diseases: Secondary | ICD-10-CM

## 2019-01-11 DIAGNOSIS — I110 Hypertensive heart disease with heart failure: Principal | ICD-10-CM | POA: Diagnosis present

## 2019-01-11 DIAGNOSIS — Z8371 Family history of colonic polyps: Secondary | ICD-10-CM

## 2019-01-11 DIAGNOSIS — I309 Acute pericarditis, unspecified: Secondary | ICD-10-CM | POA: Diagnosis not present

## 2019-01-11 DIAGNOSIS — I314 Cardiac tamponade: Secondary | ICD-10-CM | POA: Diagnosis present

## 2019-01-11 DIAGNOSIS — G8929 Other chronic pain: Secondary | ICD-10-CM | POA: Diagnosis present

## 2019-01-11 DIAGNOSIS — N179 Acute kidney failure, unspecified: Secondary | ICD-10-CM | POA: Diagnosis not present

## 2019-01-11 DIAGNOSIS — K760 Fatty (change of) liver, not elsewhere classified: Secondary | ICD-10-CM | POA: Diagnosis present

## 2019-01-11 DIAGNOSIS — M1990 Unspecified osteoarthritis, unspecified site: Secondary | ICD-10-CM | POA: Diagnosis present

## 2019-01-11 DIAGNOSIS — B9620 Unspecified Escherichia coli [E. coli] as the cause of diseases classified elsewhere: Secondary | ICD-10-CM | POA: Diagnosis present

## 2019-01-11 DIAGNOSIS — E871 Hypo-osmolality and hyponatremia: Secondary | ICD-10-CM | POA: Diagnosis present

## 2019-01-11 DIAGNOSIS — E785 Hyperlipidemia, unspecified: Secondary | ICD-10-CM | POA: Diagnosis present

## 2019-01-11 DIAGNOSIS — E1129 Type 2 diabetes mellitus with other diabetic kidney complication: Secondary | ICD-10-CM | POA: Diagnosis not present

## 2019-01-11 DIAGNOSIS — R9431 Abnormal electrocardiogram [ECG] [EKG]: Secondary | ICD-10-CM | POA: Diagnosis not present

## 2019-01-11 DIAGNOSIS — I4821 Permanent atrial fibrillation: Secondary | ICD-10-CM | POA: Diagnosis present

## 2019-01-11 DIAGNOSIS — J90 Pleural effusion, not elsewhere classified: Secondary | ICD-10-CM | POA: Diagnosis not present

## 2019-01-11 DIAGNOSIS — I2510 Atherosclerotic heart disease of native coronary artery without angina pectoris: Secondary | ICD-10-CM | POA: Diagnosis present

## 2019-01-11 DIAGNOSIS — Z7901 Long term (current) use of anticoagulants: Secondary | ICD-10-CM

## 2019-01-11 DIAGNOSIS — R197 Diarrhea, unspecified: Secondary | ICD-10-CM | POA: Diagnosis present

## 2019-01-11 DIAGNOSIS — N390 Urinary tract infection, site not specified: Secondary | ICD-10-CM | POA: Diagnosis present

## 2019-01-11 DIAGNOSIS — H409 Unspecified glaucoma: Secondary | ICD-10-CM | POA: Diagnosis present

## 2019-01-11 DIAGNOSIS — N170 Acute kidney failure with tubular necrosis: Secondary | ICD-10-CM | POA: Diagnosis present

## 2019-01-11 DIAGNOSIS — I4891 Unspecified atrial fibrillation: Secondary | ICD-10-CM | POA: Diagnosis not present

## 2019-01-11 DIAGNOSIS — D500 Iron deficiency anemia secondary to blood loss (chronic): Secondary | ICD-10-CM | POA: Diagnosis present

## 2019-01-11 DIAGNOSIS — R931 Abnormal findings on diagnostic imaging of heart and coronary circulation: Secondary | ICD-10-CM | POA: Diagnosis not present

## 2019-01-11 DIAGNOSIS — J449 Chronic obstructive pulmonary disease, unspecified: Secondary | ICD-10-CM | POA: Diagnosis present

## 2019-01-11 DIAGNOSIS — D649 Anemia, unspecified: Secondary | ICD-10-CM | POA: Diagnosis present

## 2019-01-11 DIAGNOSIS — K219 Gastro-esophageal reflux disease without esophagitis: Secondary | ICD-10-CM | POA: Diagnosis present

## 2019-01-11 DIAGNOSIS — E119 Type 2 diabetes mellitus without complications: Secondary | ICD-10-CM | POA: Diagnosis present

## 2019-01-11 DIAGNOSIS — I10 Essential (primary) hypertension: Secondary | ICD-10-CM | POA: Diagnosis present

## 2019-01-11 DIAGNOSIS — I509 Heart failure, unspecified: Secondary | ICD-10-CM

## 2019-01-11 DIAGNOSIS — I313 Pericardial effusion (noninflammatory): Secondary | ICD-10-CM | POA: Diagnosis present

## 2019-01-11 DIAGNOSIS — E279 Disorder of adrenal gland, unspecified: Secondary | ICD-10-CM | POA: Diagnosis not present

## 2019-01-11 DIAGNOSIS — Z87891 Personal history of nicotine dependence: Secondary | ICD-10-CM

## 2019-01-11 DIAGNOSIS — R570 Cardiogenic shock: Secondary | ICD-10-CM | POA: Diagnosis present

## 2019-01-11 DIAGNOSIS — Z20828 Contact with and (suspected) exposure to other viral communicable diseases: Secondary | ICD-10-CM | POA: Diagnosis present

## 2019-01-11 DIAGNOSIS — Z800 Family history of malignant neoplasm of digestive organs: Secondary | ICD-10-CM

## 2019-01-11 DIAGNOSIS — K625 Hemorrhage of anus and rectum: Secondary | ICD-10-CM | POA: Diagnosis present

## 2019-01-11 DIAGNOSIS — J9811 Atelectasis: Secondary | ICD-10-CM | POA: Diagnosis present

## 2019-01-11 DIAGNOSIS — Z888 Allergy status to other drugs, medicaments and biological substances status: Secondary | ICD-10-CM

## 2019-01-11 DIAGNOSIS — I5033 Acute on chronic diastolic (congestive) heart failure: Secondary | ICD-10-CM | POA: Diagnosis present

## 2019-01-11 DIAGNOSIS — E1159 Type 2 diabetes mellitus with other circulatory complications: Secondary | ICD-10-CM | POA: Diagnosis not present

## 2019-01-11 DIAGNOSIS — R531 Weakness: Secondary | ICD-10-CM | POA: Diagnosis not present

## 2019-01-11 DIAGNOSIS — Z8249 Family history of ischemic heart disease and other diseases of the circulatory system: Secondary | ICD-10-CM

## 2019-01-11 DIAGNOSIS — Z8349 Family history of other endocrine, nutritional and metabolic diseases: Secondary | ICD-10-CM

## 2019-01-11 DIAGNOSIS — I3139 Other pericardial effusion (noninflammatory): Secondary | ICD-10-CM | POA: Diagnosis present

## 2019-01-11 DIAGNOSIS — G2581 Restless legs syndrome: Secondary | ICD-10-CM | POA: Diagnosis present

## 2019-01-11 DIAGNOSIS — R0602 Shortness of breath: Secondary | ICD-10-CM | POA: Diagnosis present

## 2019-01-11 DIAGNOSIS — K922 Gastrointestinal hemorrhage, unspecified: Secondary | ICD-10-CM | POA: Diagnosis not present

## 2019-01-11 DIAGNOSIS — Z7984 Long term (current) use of oral hypoglycemic drugs: Secondary | ICD-10-CM

## 2019-01-11 DIAGNOSIS — E869 Volume depletion, unspecified: Secondary | ICD-10-CM | POA: Diagnosis not present

## 2019-01-11 DIAGNOSIS — Z91041 Radiographic dye allergy status: Secondary | ICD-10-CM

## 2019-01-11 DIAGNOSIS — I272 Pulmonary hypertension, unspecified: Secondary | ICD-10-CM | POA: Diagnosis present

## 2019-01-11 DIAGNOSIS — R079 Chest pain, unspecified: Secondary | ICD-10-CM | POA: Diagnosis not present

## 2019-01-11 DIAGNOSIS — M545 Low back pain: Secondary | ICD-10-CM | POA: Diagnosis present

## 2019-01-11 LAB — CBC WITH DIFFERENTIAL/PLATELET
Abs Immature Granulocytes: 0.08 10*3/uL — ABNORMAL HIGH (ref 0.00–0.07)
Basophils Absolute: 0 10*3/uL (ref 0.0–0.1)
Basophils Relative: 0 %
Eosinophils Absolute: 0 10*3/uL (ref 0.0–0.5)
Eosinophils Relative: 0 %
HCT: 27.5 % — ABNORMAL LOW (ref 36.0–46.0)
Hemoglobin: 8.7 g/dL — ABNORMAL LOW (ref 12.0–15.0)
Immature Granulocytes: 1 %
Lymphocytes Relative: 4 %
Lymphs Abs: 0.5 10*3/uL — ABNORMAL LOW (ref 0.7–4.0)
MCH: 23.9 pg — ABNORMAL LOW (ref 26.0–34.0)
MCHC: 31.6 g/dL (ref 30.0–36.0)
MCV: 75.5 fL — ABNORMAL LOW (ref 80.0–100.0)
Monocytes Absolute: 0.9 10*3/uL (ref 0.1–1.0)
Monocytes Relative: 8 %
Neutro Abs: 10.8 10*3/uL — ABNORMAL HIGH (ref 1.7–7.7)
Neutrophils Relative %: 87 %
Platelets: 384 10*3/uL (ref 150–400)
RBC: 3.64 MIL/uL — ABNORMAL LOW (ref 3.87–5.11)
RDW: 19.9 % — ABNORMAL HIGH (ref 11.5–15.5)
WBC: 12.4 10*3/uL — ABNORMAL HIGH (ref 4.0–10.5)
nRBC: 0 % (ref 0.0–0.2)

## 2019-01-11 LAB — COMPREHENSIVE METABOLIC PANEL
ALT: 16 U/L (ref 0–44)
AST: 22 U/L (ref 15–41)
Albumin: 3.1 g/dL — ABNORMAL LOW (ref 3.5–5.0)
Alkaline Phosphatase: 76 U/L (ref 38–126)
Anion gap: 19 — ABNORMAL HIGH (ref 5–15)
BUN: 33 mg/dL — ABNORMAL HIGH (ref 8–23)
CO2: 21 mmol/L — ABNORMAL LOW (ref 22–32)
Calcium: 9.4 mg/dL (ref 8.9–10.3)
Chloride: 80 mmol/L — ABNORMAL LOW (ref 98–111)
Creatinine, Ser: 1.44 mg/dL — ABNORMAL HIGH (ref 0.44–1.00)
GFR calc Af Amer: 43 mL/min — ABNORMAL LOW (ref >60)
GFR calc non Af Amer: 37 mL/min — ABNORMAL LOW (ref >60)
Glucose, Bld: 217 mg/dL — ABNORMAL HIGH (ref 70–99)
Potassium: 4.5 mmol/L (ref 3.5–5.1)
Sodium: 120 mmol/L — ABNORMAL LOW (ref 135–145)
Total Bilirubin: 0.7 mg/dL (ref 0.3–1.2)
Total Protein: 6.8 g/dL (ref 6.5–8.1)

## 2019-01-11 LAB — TROPONIN I: Troponin I: <0.03 ng/mL (ref <0.03)

## 2019-01-11 LAB — GLUCOSE, CAPILLARY: Glucose-Capillary: 201 mg/dL — ABNORMAL HIGH (ref 70–99)

## 2019-01-11 LAB — BRAIN NATRIURETIC PEPTIDE: B Natriuretic Peptide: 132.1 pg/mL — ABNORMAL HIGH (ref 0.0–100.0)

## 2019-01-11 LAB — SARS CORONAVIRUS 2 BY RT PCR (HOSPITAL ORDER, PERFORMED IN ~~LOC~~ HOSPITAL LAB): SARS Coronavirus 2: NEGATIVE

## 2019-01-11 MED ORDER — SODIUM CHLORIDE 0.9% FLUSH
3.0000 mL | Freq: Two times a day (BID) | INTRAVENOUS | Status: DC
  Administered 2019-01-11 – 2019-01-16 (×9): 3 mL via INTRAVENOUS

## 2019-01-11 MED ORDER — FUROSEMIDE 10 MG/ML IJ SOLN
60.0000 mg | Freq: Two times a day (BID) | INTRAMUSCULAR | Status: DC
  Filled 2019-01-11: qty 6

## 2019-01-11 MED ORDER — TAB-A-VITE/IRON PO TABS
1.0000 | ORAL_TABLET | Freq: Every day | ORAL | Status: DC
  Administered 2019-01-14 – 2019-01-16 (×3): 1 via ORAL
  Filled 2019-01-11 (×6): qty 1

## 2019-01-11 MED ORDER — HYDROCODONE-ACETAMINOPHEN 5-325 MG PO TABS
1.0000 | ORAL_TABLET | Freq: Every day | ORAL | Status: DC
  Administered 2019-01-11 – 2019-01-15 (×5): 1 via ORAL
  Filled 2019-01-11 (×5): qty 1

## 2019-01-11 MED ORDER — MELATONIN 3 MG PO TABS
9.0000 mg | ORAL_TABLET | Freq: Every day | ORAL | Status: DC
  Administered 2019-01-11 – 2019-01-15 (×3): 15 mg via ORAL
  Filled 2019-01-11: qty 3
  Filled 2019-01-11 (×8): qty 5

## 2019-01-11 MED ORDER — ONDANSETRON HCL 4 MG/2ML IJ SOLN
4.0000 mg | Freq: Once | INTRAMUSCULAR | Status: AC
  Administered 2019-01-11: 4 mg via INTRAVENOUS
  Filled 2019-01-11: qty 2

## 2019-01-11 MED ORDER — SODIUM CHLORIDE 0.9% FLUSH: 3.0000 mL | INTRAVENOUS | Status: DC | PRN

## 2019-01-11 MED ORDER — SENNA 8.6 MG PO TABS
1.0000 | ORAL_TABLET | Freq: Every day | ORAL | Status: DC
  Administered 2019-01-11 – 2019-01-15 (×4): 8.6 mg via ORAL
  Filled 2019-01-11 (×5): qty 1

## 2019-01-11 MED ORDER — ONETOUCH DELICA LANCETS FINE MISC: 1.0000 [IU] | Status: DC

## 2019-01-11 MED ORDER — ISOSORBIDE MONONITRATE ER 30 MG PO TB24
15.0000 mg | ORAL_TABLET | Freq: Every day | ORAL | Status: DC
  Filled 2019-01-11: qty 1

## 2019-01-11 MED ORDER — RIVAROXABAN 20 MG PO TABS
20.0000 mg | ORAL_TABLET | Freq: Every day | ORAL | Status: DC
  Administered 2019-01-11: 20 mg via ORAL
  Filled 2019-01-11: qty 1

## 2019-01-11 MED ORDER — INSULIN ASPART 100 UNIT/ML ~~LOC~~ SOLN
0.0000 [IU] | Freq: Three times a day (TID) | SUBCUTANEOUS | Status: DC
  Administered 2019-01-12 – 2019-01-13 (×4): 2 [IU] via SUBCUTANEOUS
  Administered 2019-01-13: 1 [IU] via SUBCUTANEOUS
  Administered 2019-01-14 – 2019-01-16 (×6): 2 [IU] via SUBCUTANEOUS

## 2019-01-11 MED ORDER — SODIUM CHLORIDE 0.9 % IV SOLN: 250.0000 mL | INTRAVENOUS | Status: DC | PRN

## 2019-01-11 MED ORDER — ACETAMINOPHEN 325 MG PO TABS
650.0000 mg | ORAL_TABLET | ORAL | Status: DC | PRN
  Administered 2019-01-15: 650 mg via ORAL
  Filled 2019-01-11: qty 2

## 2019-01-11 MED ORDER — VITAMIN C 500 MG PO TABS
1000.0000 mg | ORAL_TABLET | Freq: Two times a day (BID) | ORAL | Status: DC
  Administered 2019-01-11 – 2019-01-16 (×10): 1000 mg via ORAL
  Filled 2019-01-11 (×10): qty 2

## 2019-01-11 MED ORDER — ONDANSETRON HCL 4 MG/2ML IJ SOLN
4.0000 mg | Freq: Four times a day (QID) | INTRAMUSCULAR | Status: DC | PRN
  Administered 2019-01-11 – 2019-01-12 (×2): 4 mg via INTRAVENOUS
  Filled 2019-01-11 (×2): qty 2

## 2019-01-11 MED ORDER — GUAIFENESIN ER 600 MG PO TB12: 600.0000 mg | ORAL_TABLET | Freq: Two times a day (BID) | ORAL | Status: DC | PRN

## 2019-01-11 MED ORDER — MELATONIN 5 MG PO TABS
2.0000 | ORAL_TABLET | Freq: Every day | ORAL | Status: DC
  Filled 2019-01-11: qty 3

## 2019-01-11 MED ORDER — RIVAROXABAN 20 MG PO TABS: 20.0000 mg | ORAL_TABLET | Freq: Every day | ORAL | Status: DC

## 2019-01-11 MED ORDER — INSULIN ASPART 100 UNIT/ML ~~LOC~~ SOLN
0.0000 [IU] | Freq: Every day | SUBCUTANEOUS | Status: DC
  Administered 2019-01-11: 2 [IU] via SUBCUTANEOUS

## 2019-01-11 MED ORDER — FUROSEMIDE 10 MG/ML IJ SOLN
60.0000 mg | Freq: Once | INTRAMUSCULAR | Status: AC
  Administered 2019-01-11: 60 mg via INTRAVENOUS
  Filled 2019-01-11: qty 6

## 2019-01-11 MED ORDER — LATANOPROST 0.005 % OP SOLN
1.0000 [drp] | Freq: Every day | OPHTHALMIC | Status: DC
  Administered 2019-01-13 – 2019-01-15 (×4): 1 [drp] via OPHTHALMIC
  Filled 2019-01-11 (×2): qty 2.5

## 2019-01-11 NOTE — H&P (Signed)
History and Physical    [name] XKG:[id number] DOB: 01/29/1951 DOA: 01/11/2019  PCP: Hoyt Koch, MD  Patient coming from: Home  Chief Complaint: Shortness of breath  HPI: [name] is a 68 y.o. female with medical history significant of diastolic congestive heart failure, A. fib on Xarelto, COPD, coronary artery disease recently admitted for CHF exacerbation and diuresed well and has recently been on torsemide 40 mg twice a day.  Patient reports that overall her lower extremity swelling has markedly improved but is still some present.  The issue she is having is her shortness of breath and dyspnea on exertion is not improving.  She denies any cough or fevers.  She does not feel like the torsemide is helping her much at home.  She denies any chest pain.  Bedside echo done by Dr. Laverta Baltimore in the emergency department shows a pericardial effusion but he cannot quantify.  Cardiology was called who advised diuresis and obtaining a formal echo in the morning and they will see in the morning.  Patient's blood pressure soft otherwise vital signs are normal with 100% O2 sats on room air.   Review of Systems: As per HPI otherwise 10 point review of systems negative.   Past Medical History:  Diagnosis Date  . Anemia, unspecified   . Arthritis   . Asthma   . Atrial fibrillation with RVR ([place])    a. on Xarelto  . Bell's palsy    Facial nerve decompression in 2001  . CHF (congestive heart failure) ([place])   . Chronic low back pain   . COPD with asthma ([place])   . Coronary artery disease    Myoview 04/12/11 was entirely normal. ECHO 02/26/08 showed only minor abnormalities. Stenting 05/26/08 of her posterolateral branch to the left circumflex coronary artery. Used a 2.5x59mm Taxus Monorail stent.myoview 2014 was without ischemia  . Diabetes mellitus    Type 2  . Early cataracts, bilateral   . Fatty liver   . GERD (gastroesophageal reflux disease)   . Glaucoma   . Glaucoma   . Goiter    . Heart murmur   . History of nuclear stress test 2012; 2014   lexiscan; normal pattern of perfusion; normal, low risk scan   . Hyperlipidemia   . Hypertension   . Panic disorder   . Pneumonia 2008  . Polycystic ovary disease    Hysterectomy in 1982 for this  . Shortness of breath dyspnea    ECHO 02/26/08 showed only minor abnormalities  . Spinal stenosis     Past Surgical History:  Procedure Laterality Date  . ABDOMINAL HYSTERECTOMY  1982   & BSO; for polycystic ovary disease  . CARDIOVERSION N/A 12/17/2013   Procedure: CARDIOVERSION;  Surgeon: Pixie Casino, MD;  Location: [facility] ENDOSCOPY;  Service: Cardiovascular;  Laterality: N/A;  . CARDIOVERSION N/A 09/30/2015   Procedure: CARDIOVERSION;  Surgeon: Dorothy Spark, MD;  Location: [facility] ENDOSCOPY;  Service: Cardiovascular;  Laterality: N/A;  . CARDIOVERSION N/A 06/22/2016   Procedure: CARDIOVERSION;  Surgeon: Skeet Latch, MD;  Location: [place];  Service: Cardiovascular;  Laterality: N/A;  . COLONOSCOPY     last 2009; Dr Cristina Gong; due 2019  . COLONOSCOPY WITH PROPOFOL N/A 01/23/2018   Procedure: COLONOSCOPY WITH PROPOFOL;  Surgeon: Ronald Lobo, MD;  Location: [place];  Service: Endoscopy;  Laterality: N/A;  . CORONARY ANGIOPLASTY  05/26/2008   Stenting of her posterolateral branch to the left circumflex coronary artery. Used a 2.5x58mm Taxus Monorail  stent.  . ESOPHAGOGASTRODUODENOSCOPY (EGD) WITH PROPOFOL N/A 01/23/2018   Procedure: ESOPHAGOGASTRODUODENOSCOPY (EGD) WITH PROPOFOL;  Surgeon: Ronald Lobo, MD;  Location: [place];  Service: Endoscopy;  Laterality: N/A;  . FACIAL NERVE DECOMPRESSION  2001/2002   bells palsy   . LAPAROSCOPIC CHOLECYSTECTOMY  06/15/2011    Dr Dalbert Batman  . TEE WITHOUT CARDIOVERSION N/A 12/17/2013   Procedure: TRANSESOPHAGEAL ECHOCARDIOGRAM (TEE);  Surgeon: Pixie Casino, MD;  Location: [facility] ENDOSCOPY;  Service: Cardiovascular;  Laterality: N/A;  trish/ja  . TRANSTHORACIC  ECHOCARDIOGRAM  9/41/7408   LV systolic function normal with mild conc LVH; LA mildly dilated; trace MR/TR  . UPPER GI ENDOSCOPY  2009   negative     reports that she quit smoking about 25 years ago. Her smoking use included cigarettes. She has a 45.00 pack-year smoking history. She has never used smokeless tobacco. She reports that she does not drink alcohol or use drugs.  Allergies  Allergen Reactions  . Benadryl [Diphenhydramine Hcl] Other (See Comments)    Restless leg  . Clopidogrel Bisulfate Nausea Only    Nausea & pain  . Contrast Media [Iodinated Diagnostic Agents] Palpitations    Rapid heart rate, hot    Family History  Problem Relation Age of Onset  . Heart attack Father 61       2nd MI at 63  . Colon cancer Brother 76  . Gout Brother   . Ulcers Mother   . Emphysema Mother 13  . Colon polyps Sister   . Cancer Sister        Basal cell carcinoma  . Pneumonia Maternal Grandmother   . Hypertension Brother   . Hyperlipidemia Brother   . Cancer Brother        Skin  . Diabetes Neg Hx   . Stroke Neg Hx     Prior to Admission medications   Medication Sig Start Date End Date Taking? Authorizing Provider  ascorbic acid (VITAMIN C) 1000 MG tablet Take 1,000 mg by mouth 2 (two) times daily.    Yes [provider]  CALCIUM-MAG-VIT C-VIT D PO Take 1 tablet by mouth daily.    Yes [provider]  celecoxib (CELEBREX) 200 MG capsule Take 1 capsule (200 mg total) by mouth 2 (two) times daily. 01/02/19  Yes Hoyt Koch, MD  diltiazem (CARDIZEM CD) 180 MG 24 hr capsule TAKE 1 CAPSULE DAILY. Patient taking differently: Take 180 mg by mouth daily.  12/15/18  Yes Lorretta Harp, MD  gabapentin (NEURONTIN) 100 MG capsule TAKE (1) CAPSULE THREE TIMES DAILY. Patient taking differently: Take 100 mg by mouth 3 (three) times daily.  01/06/19  Yes Hoyt Koch, MD  guaiFENesin (MUCINEX) 600 MG 12 hr tablet Take 600 mg by mouth 2 (two) times daily as  needed for cough.    Yes [provider]  HYDROcodone-acetaminophen (NORCO/VICODIN) 5-325 MG tablet Take 1 tablet by mouth at bedtime. 01/02/19  Yes Hoyt Koch, MD  INVOKANA 100 MG TABS tablet TAKE 1 TABLET ONCE DAILY. Patient taking differently: Take 100 mg by mouth daily before breakfast.  09/22/18  Yes Hoyt Koch, MD  isosorbide mononitrate (IMDUR) 30 MG 24 hr tablet Take 0.5 tablets (15 mg total) by mouth daily. 01/06/19  Yes Lorretta Harp, MD  latanoprost (XALATAN) 0.005 % ophthalmic solution Place 1 drop into both eyes at bedtime. 12/09/13  Yes [provider]  LORazepam (ATIVAN) 0.5 MG tablet TAKE 1 OR 2 TABLETS AT BEDTIME. Patient taking differently:  Take 0.5-1 mg by mouth at bedtime.  10/14/18  Yes Hoyt Koch, MD  Melatonin 5 MG TABS Take 2-3 tablets by mouth at bedtime.    Yes [provider]  metoprolol succinate (TOPROL-XL) 25 MG 24 hr tablet TAKE ONE TABLET AT BEDTIME. Patient taking differently: Take 25 mg by mouth as needed. Take as needed for palpitations 12/02/18  Yes Sherran Needs, NP  metoprolol succinate (TOPROL-XL) 50 MG 24 hr tablet Take 100 mg by mouth daily.  12/14/18  Yes [provider]  Multiple Vitamins-Iron (MULTIVITAMINS WITH IRON) TABS Take 1 tablet by mouth daily.    Yes [provider]  nystatin ointment (MYCOSTATIN) Apply 1 application topically 2 (two) times daily as needed (IRRITATION).  07/09/16  Yes [provider]  omeprazole (PRILOSEC) 20 MG capsule Take 1 capsule (20 mg total) by mouth 2 (two) times daily before a meal. 12/31/18  Yes Vann, Jessica U, DO  PARoxetine (PAXIL) 10 MG tablet TAKE 2 AND 1/2 TABS DAILY Patient taking differently: Take 25 mg by mouth daily.  10/13/18  Yes Hoyt Koch, MD  quinapril (ACCUPRIL) 40 MG tablet TAKE 1 TABLET ONCE DAILY. Patient taking differently: Take 40 mg by mouth at bedtime.  09/18/18  Yes Hoyt Koch, MD  rOPINIRole  (REQUIP XL) 2 MG 24 hr tablet TAKE ONE TABLET AT BEDTIME. Patient taking differently: Take 2 mg by mouth at bedtime.  07/24/18  Yes Dohmeier, Asencion Partridge, MD  rOPINIRole (REQUIP) 0.5 MG tablet Take 1 to 2 tab at midnight. ( XL form at 6 PM) Patient taking differently: Take 0.5-1 mg by mouth daily as needed (restless legs).  06/30/18  Yes Dohmeier, Asencion Partridge, MD  rosuvastatin (CRESTOR) 10 MG tablet TAKE ONE TABLET AT BEDTIME. Patient taking differently: Take 10 mg by mouth daily.  10/21/18  Yes Lorretta Harp, MD  senna (SENOKOT) 8.6 MG TABS Take 1 tablet by mouth at bedtime.    Yes [provider]  sitaGLIPtin-metformin (JANUMET) 50-1000 MG tablet TAKE 1/2 TABLET IN THE MORNING WITH BREAKFAST AND 1 TABLET IN THE EVENING WITH SUPPER. Patient taking differently: Take 0.5-1 tablets by mouth See admin instructions. TAKE 1/2 TABLET IN THE MORNING WITH BREAKFAST AND 1 TABLET IN THE EVENING WITH SUPPER. 11/17/18  Yes Hoyt Koch, MD  Tiotropium Bromide-Olodaterol (STIOLTO RESPIMAT) 2.5-2.5 MCG/ACT AERS Inhale 2 puffs into the lungs daily. 12/01/18  Yes Young, Tarri Fuller D, MD  torsemide (DEMADEX) 20 MG tablet Take 2 tablets (40 mg total) by mouth 2 (two) times daily. 01/06/19  Yes Lorretta Harp, MD  XARELTO 20 MG TABS tablet TAKE 1 TABLET ONCE DAILY WITH SUPPER. Patient taking differently: Take 20 mg by mouth daily with supper.  11/25/18  Yes Lorretta Harp, MD  Continuous Blood Gluc Sensor ([place]) MISC Use to monitor sugars 04/23/17   Hoyt Koch, MD  [facility] DELICA LANCETS FINE MISC 1 Units by Does not apply route as directed. 10/05/11   Midge Minium, MD    Physical Exam: Vitals:   01/11/19 1700 01/11/19 1800 01/11/19 1830 01/11/19 1846  BP: 95/76  (!) 85/63   Pulse: 84 80 69 85  Resp: 19 (!) 21 14 17   Temp:      TempSrc:      SpO2: 94% 99% 95% 96%  Weight:      Height:          Constitutional: NAD, calm, comfortable Vitals:   01/11/19  1700 01/11/19 1800  01/11/19 1830 01/11/19 1846  BP: 95/76  (!) 85/63   Pulse: 84 80 69 85  Resp: 19 (!) 21 14 17   Temp:      TempSrc:      SpO2: 94% 99% 95% 96%  Weight:      Height:       Eyes: PERRL, lids and conjunctivae normal ENMT: Mucous membranes are moist. Posterior pharynx clear of any exudate or lesions.Normal dentition.  Neck: normal, supple, no masses, no thyromegaly Respiratory: clear to auscultation bilaterally, no wheezing, no crackles. Normal respiratory effort. No accessory muscle use.  Cardiovascular: Regular rate and rhythm, no murmurs / rubs / gallops.  1-2+ extremity edema. 2+ pedal pulses. No carotid bruits.  Abdomen: no tenderness, no masses palpated. No hepatosplenomegaly. Bowel sounds positive.  Musculoskeletal: no clubbing / cyanosis. No joint deformity upper and lower extremities. Good ROM, no contractures. Normal muscle tone.  Skin: no rashes, lesions, ulcers. No induration Neurologic: CN 2-12 grossly intact. Sensation intact, DTR normal. Strength 5/5 in all 4.  Psychiatric: Normal judgment and insight. Alert and oriented x 3. Normal mood.    Labs on Admission: I have personally reviewed following labs and imaging studies  CBC: Recent Labs  Lab 01/06/19 1342 01/11/19 1608  WBC 10.3 12.4*  NEUTROABS  --  10.8*  HGB 8.5 Repeated and verified X2.* 8.7*  HCT 25.4 Repeated and verified X2.* 27.5*  MCV 75.8* 75.5*  PLT 293.0 323   Basic Metabolic Panel: Recent Labs  Lab 01/06/19 1342 01/11/19 1608  NA 122* 120*  K 4.2 4.5  CL 85* 80*  CO2 27 21*  GLUCOSE 139* 217*  BUN 31* 33*  CREATININE 1.33* 1.44*  CALCIUM 9.0 9.4   GFR: Estimated Creatinine Clearance: 37.6 mL/min (A) (by C-G formula based on SCr of 1.44 mg/dL (H)). Liver Function Tests: Recent Labs  Lab 01/06/19 1342 01/11/19 1608  AST 16 22  ALT 11 16  ALKPHOS 69 76  BILITOT 0.5 0.7  PROT 6.9 6.8  ALBUMIN 3.7 3.1*   No results for input(s): LIPASE, AMYLASE in the last 168  hours. No results for input(s): AMMONIA in the last 168 hours. Coagulation Profile: No results for input(s): INR, PROTIME in the last 168 hours. Cardiac Enzymes: Recent Labs  Lab 01/11/19 1608  TROPONINI <0.03   BNP (last 3 results) Recent Labs    11/27/18 1547  PROBNP 219.0*   HbA1C: No results for input(s): HGBA1C in the last 72 hours. CBG: No results for input(s): GLUCAP in the last 168 hours. Lipid Profile: No results for input(s): CHOL, HDL, LDLCALC, TRIG, CHOLHDL, LDLDIRECT in the last 72 hours. Thyroid Function Tests: No results for input(s): TSH, T4TOTAL, FREET4, T3FREE, THYROIDAB in the last 72 hours. Anemia Panel: No results for input(s): VITAMINB12, FOLATE, FERRITIN, TIBC, IRON, RETICCTPCT in the last 72 hours. Urine analysis:    Component Value Date/Time   COLORURINE STRAW (A) 12/30/2018 1620   APPEARANCEUR CLEAR 12/30/2018 1620   LABSPEC 1.003 (L) 12/30/2018 1620   PHURINE 5.0 12/30/2018 1620   GLUCOSEU >=500 (A) 12/30/2018 1620   HGBUR NEGATIVE 12/30/2018 1620   HGBUR negative 05/18/2010 [place] 12/30/2018 1620   BILIRUBINUR Neg 10/02/2018 1202   KETONESUR NEGATIVE 12/30/2018 1620   PROTEINUR NEGATIVE 12/30/2018 1620   UROBILINOGEN 0.2 10/02/2018 1202   UROBILINOGEN 0.2 06/11/2011 1215   NITRITE POSITIVE (A) 12/30/2018 1620   LEUKOCYTESUR NEGATIVE 12/30/2018 1620   Sepsis Labs: !!!!!!!!!!!!!!!!!!!!!!!!!!!!!!!!!!!!!!!!!!!! @LABRCNTIP (procalcitonin:4,lacticidven:4) ) Recent Results (from the past  240 hour(s))  SARS Coronavirus 2 (CEPHEID- Performed in [place] hospital lab), Hosp Order     Status: None   Collection Time: 01/11/19  4:18 PM  Result Value Ref Range Status   SARS Coronavirus 2 NEGATIVE NEGATIVE Final    Comment: (NOTE) If result is NEGATIVE SARS-CoV-2 target nucleic acids are NOT DETECTED. The SARS-CoV-2 RNA is generally detectable in upper and lower  respiratory specimens during the acute phase of infection. The  lowest  concentration of SARS-CoV-2 viral copies this assay can detect is 250  copies / mL. A negative result does not preclude SARS-CoV-2 infection  and should not be used as the sole basis for treatment or other  patient management decisions.  A negative result may occur with  improper specimen collection / handling, submission of specimen other  than nasopharyngeal swab, presence of viral mutation(s) within the  areas targeted by this assay, and inadequate number of viral copies  (<250 copies / mL). A negative result must be combined with clinical  observations, patient history, and epidemiological information. If result is POSITIVE SARS-CoV-2 target nucleic acids are DETECTED. The SARS-CoV-2 RNA is generally detectable in upper and lower  respiratory specimens dur ing the acute phase of infection.  Positive  results are indicative of active infection with SARS-CoV-2.  Clinical  correlation with patient history and other diagnostic information is  necessary to determine patient infection status.  Positive results do  not rule out bacterial infection or co-infection with other viruses. If result is PRESUMPTIVE POSTIVE SARS-CoV-2 nucleic acids MAY BE PRESENT.   A presumptive positive result was obtained on the submitted specimen  and confirmed on repeat testing.  While 2019 novel coronavirus  (SARS-CoV-2) nucleic acids may be present in the submitted sample  additional confirmatory testing may be necessary for epidemiological  and / or clinical management purposes  to differentiate between  SARS-CoV-2 and other Sarbecovirus currently known to infect humans.  If clinically indicated additional testing with an alternate test  methodology [phone]) is advised. The SARS-CoV-2 RNA is generally  detectable in upper and lower respiratory sp ecimens during the acute  phase of infection. The expected result is Negative. Fact Sheet for Patients:  StrictlyIdeas.no  Fact Sheet for Healthcare Providers: BankingDealers.co.za This test is not yet approved or cleared by the [place] FDA and has been authorized for detection and/or diagnosis of SARS-CoV-2 by FDA under an Emergency Use Authorization (EUA).  This EUA will remain in effect (meaning this test can be used) for the duration of the COVID-19 declaration under Section 564(b)(1) of the Act, 21 U.S.C. section 360bbb-3(b)(1), unless the authorization is terminated or revoked sooner. Performed at [facility], [place] [address] [place], [place] [address]      Radiological Exams on Admission: Dg Chest Portable 1 View  Result Date: 01/11/2019 CLINICAL DATA:  Stable enlarged cardiac silhouette. EXAM: PORTABLE CHEST 1 VIEW COMPARISON:  12/29/2018 FINDINGS: Cardiac silhouette is enlarged compared to prior. No effusion, infiltrate or pneumothorax. No acute osseous abnormality. IMPRESSION: Enlarged cardiac silhouette representing either new cardiomegaly or pericardial effusion. Electronically Signed   By: Suzy Bouchard M.D.   On: 01/11/2019 16:57    EKG: Independently reviewed.  A. fib rate controlled no acute changes Old chart reviewed Case discussed with Dr. Laverta Baltimore in the ED Chest x-ray reviewed large cardiac shadow no focal infiltrate or edema  Assessment/Plan 68 year old female with acute on chronic diastolic congestive heart failure with possible pericardial effusion Principal Problem:   Acute  on chronic diastolic CHF (congestive heart failure) (HCC)-with pericardial effusion.  No tip not noted by Dr. Laverta Baltimore.  Cardiology called Dr. Madelon Lips advised they will see patient in the morning formally will order cardiac echo.  Placed on Lasix 60 mg IV every 12 hours.  Monitor I's and O's and daily weights.  Placed on low-sodium diet.  Holding her other blood pressure medications in order for [place] to be able to diurese her due to her soft blood pressures.  As blood pressure allows  reinstitute her beta-blocker and calcium channel blocker along with her ACE inhibitor.  Placed on telemetry monitoring.  Serial troponin.  Active Problems:   Type 2 diabetes mellitus with vascular disease (HCC)-sliding scale insulin holding orals    Essential hypertension-patient blood pressure soft right now.  Holding beta-blocker and calcium channel blocker along with ACE inhibitor while increasing diuresis.  Reinstitute blood pressure medications as she can tolerate    CAD S/P percutaneous coronary angioplasty-stable    COPD mixed type (HCC)-compensated and stable at this time lung exam clear    Permanent atrial fibrillation-continue Xarelto monitor rate on telemetry   Med rec pending pharmacy review   DVT prophylaxis: Xarelto Code Status: Full Family Communication: None Disposition Plan: 1 to 2 days Consults called: Cardiology Admission status: Observation   Sunny Gains A MD [facility]  If 7PM-7AM, please contact night-coverage [email or web address] Password [email or web address]  01/11/2019, 8:27 PM

## 2019-01-11 NOTE — ED Triage Notes (Signed)
Patient reports increased shortness of breath over the past week, but states it's been going on for a few months - was recently diagnosed with CHF. Denies chest pain, new cough, fevers/chills. Resp e/u.

## 2019-01-11 NOTE — ED Provider Notes (Signed)
Emergency Department Provider Note   I have reviewed the triage vital signs and the nursing notes.   HISTORY  Chief Complaint Shortness of Breath and Hypotension   HPI [name] is a 68 y.o. female with past medical history of diabetes, A. fib on Xarelto, CAD, COPD, and history of GI bleeding presents to the emergency department with shortness of breath, leg swelling, and burning chest discomfort.  Symptoms began yesterday and have worsened over the past 24 hours.  She denies any fevers.  She was discharged home from the hospital on 5/20 and has remained compliant with her home medications.  She has a worsening shortness of breath with exertion.  She said she can now only walk several feet without getting very winded.  She developed some burning type chest pain this morning which is been persistent throughout the day.  She feels that her leg swelling is improved from when she was in the hospital the last time but may be worsening slightly.  She has not been in contact with known sick individuals other than her hospitalization.  No vomiting or diarrhea.  No radiation of symptoms or other modifying factors.   Patient also notes hypotension yesterday.  She reports systolic pressures in the 70s.  Somewhat improved today.  Was feeling weakness at the time.   Past Medical History:  Diagnosis Date   Anemia, unspecified    Arthritis    Asthma    Atrial fibrillation with RVR ([place])    a. on Xarelto   Bell's palsy    Facial nerve decompression in 2001   CHF (congestive heart failure) (HCC)    Chronic low back pain    COPD with asthma ([place])    Coronary artery disease    Myoview 04/12/11 was entirely normal. ECHO 02/26/08 showed only minor abnormalities. Stenting 05/26/08 of her posterolateral branch to the left circumflex coronary artery. Used a 2.5x42mm Taxus Monorail stent.myoview 2014 was without ischemia   Diabetes mellitus    Type 2   Early cataracts, bilateral    Fatty  liver    GERD (gastroesophageal reflux disease)    Glaucoma    Glaucoma    Goiter    Heart murmur    History of nuclear stress test 2012; 2014   lexiscan; normal pattern of perfusion; normal, low risk scan    Hyperlipidemia    Hypertension    Panic disorder    Pneumonia 2008   Polycystic ovary disease    Hysterectomy in 1982 for this   Shortness of breath dyspnea    ECHO 02/26/08 showed only minor abnormalities   Spinal stenosis     Patient Active Problem List   Diagnosis Date Noted   CHF (congestive heart failure) ([place]) 01/11/2019   Hyponatremia 12/30/2018   Acute on chronic diastolic CHF (congestive heart failure) ([place]) 12/30/2018   Stasis dermatitis of both legs 11/27/2018   Permanent atrial fibrillation 06/10/2018   Arthritis 10/25/2017   COPD mixed type ([place]) 10/25/2017   Fibrocystic disease of breast 10/25/2017   Gastroesophageal reflux disease 10/25/2017   Glaucoma 10/25/2017   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   Heart murmur 10/25/2017   Insomnia 10/25/2017   Osteopenia 10/25/2017   RLS (restless legs syndrome) 04/23/2017   Routine general medical examination at a health care facility 11/16/2016   Diabetic polyneuropathy ([place]) 07/30/2016   Depression 01/04/2016   Obesity (BMI 30.0-34.9) 10/11/2015   CAD S/P percutaneous coronary angioplasty 04/28/2013   Essential hypertension 05/06/2009   LOW BACK PAIN,  CHRONIC 02/12/2008   Microcytic anemia 08/15/2007   Type 2 diabetes mellitus with vascular disease ([place]) 01/02/2007   Hyperlipidemia associated with type 2 diabetes mellitus ([place]) 01/02/2007   ELEVATION, TRANSAMINASE/LDH LEVELS 01/02/2007    Past Surgical History:  Procedure Laterality Date   ABDOMINAL HYSTERECTOMY  1982   & BSO; for polycystic ovary disease   CARDIOVERSION N/A 12/17/2013   Procedure: CARDIOVERSION;  Surgeon: Pixie Casino, MD;  Location: [facility];  Service: Cardiovascular;  Laterality: N/A;    CARDIOVERSION N/A 09/30/2015   Procedure: CARDIOVERSION;  Surgeon: Dorothy Spark, MD;  Location: [facility] ENDOSCOPY;  Service: Cardiovascular;  Laterality: N/A;   CARDIOVERSION N/A 06/22/2016   Procedure: CARDIOVERSION;  Surgeon: Skeet Latch, MD;  Location: [place];  Service: Cardiovascular;  Laterality: N/A;   COLONOSCOPY     last 2009; Dr Cristina Gong; due 2019   COLONOSCOPY WITH PROPOFOL N/A 01/23/2018   Procedure: COLONOSCOPY WITH PROPOFOL;  Surgeon: Ronald Lobo, MD;  Location: [place];  Service: Endoscopy;  Laterality: N/A;   CORONARY ANGIOPLASTY  05/26/2008   Stenting of her posterolateral branch to the left circumflex coronary artery. Used a 2.5x31mm Taxus Monorail stent.   ESOPHAGOGASTRODUODENOSCOPY (EGD) WITH PROPOFOL N/A 01/23/2018   Procedure: ESOPHAGOGASTRODUODENOSCOPY (EGD) WITH PROPOFOL;  Surgeon: Ronald Lobo, MD;  Location: [place];  Service: Endoscopy;  Laterality: N/A;   FACIAL NERVE DECOMPRESSION  2001/2002   bells palsy    LAPAROSCOPIC CHOLECYSTECTOMY  06/15/2011    Dr Dalbert Batman   TEE WITHOUT CARDIOVERSION N/A 12/17/2013   Procedure: TRANSESOPHAGEAL ECHOCARDIOGRAM (TEE);  Surgeon: Pixie Casino, MD;  Location[facility] END[facility]COPY;  Service: Cardiovascular;  Laterality: N/A;  trish/ja   TRANSTHORACIC ECHOCARDIOGRAM  6/76/1950   LV systolic function normal with mild conc LVH; LA mildly dilated; trace MR/TR   UPPER GI ENDOSCOPY  2009   negative    Allergies Benadryl [diphenhydramine hcl]; Clopidogrel bisulfate; and Contrast media [iodinated diagnostic agents]  Family History  Problem Relation Age of Onset   Heart attack Father 38       2nd MI at 77   Colon cancer Brother 49   Gout Brother    Ulcers Mother    Emphysema Mother 40   Colon polyps Sister    Cancer Sister        Basal cell carcinoma   Pneumonia Maternal Grandmother    Hypertension Brother    Hyperlipidemia Brother    Cancer Brother        Skin   Diabetes Neg Hx     Stroke Neg Hx     Social History Social History   Tobacco Use   Smoking status: Former Smoker    Packs/day: 1.50    Years: 30.00    Pack years: 45.00    Types: Cigarettes    Last attempt to quit: 08/13/1993    Years since quitting: 25.4   Smokeless tobacco: Never Used  Substance Use Topics   Alcohol use: No   Drug use: No    Review of Systems  Constitutional: No fever/chills. Positive generalized weakness.  Eyes: No visual changes. ENT: No sore throat. Cardiovascular: Positive chest pain.  Respiratory: Positive shortness of breath. Gastrointestinal: No abdominal pain.  No nausea, no vomiting.  No diarrhea.  No constipation. Genitourinary: Negative for dysuria. Musculoskeletal: Negative for back pain. Skin: Negative for rash. Neurological: Negative for headaches, focal weakness or numbness.  10-point ROS otherwise negative.  ____________________________________________   PHYSICAL EXAM:  VITAL SIGNS: Vitals:   01/11/19 2100 01/11/19 2210  BP: 99/76 (!) 88/65  Pulse:  76  Resp: 15 18  Temp:  (!) 97 F (36.1 C)  SpO2:  92%    Constitutional: Alert and oriented. Well appearing and in no acute distress. Eyes: Conjunctivae are normal. Head: Atraumatic. Nose: No congestion/rhinnorhea. Mouth/Throat: Mucous membranes are moist.  Neck: No stridor.  Cardiovascular: Normal rate, regular rhythm. Good peripheral circulation. Grossly normal heart sounds.   Respiratory: Increased respiratory effort.  No retractions. Lungs with crackles at the bases bilaterally. No wheezing.  Gastrointestinal: Soft and nontender. No distention.  Musculoskeletal: No lower extremity tenderness. Positive 3+ pitting edema in the bilateral LEs. No gross deformities of extremities. Neurologic:  Normal speech and language. Skin:  Skin is warm, dry and intact. No rash noted.  ____________________________________________   LABS (all labs ordered are listed, but only abnormal results are  displayed)  Labs Reviewed  COMPREHENSIVE METABOLIC PANEL - Abnormal; Notable for the following components:      Result Value   Sodium 120 (*)    Chloride 80 (*)    CO2 21 (*)    Glucose, Bld 217 (*)    BUN 33 (*)    Creatinine, Ser 1.44 (*)    Albumin 3.1 (*)    GFR calc non Af Amer 37 (*)    GFR calc Af Amer 43 (*)    Anion gap 19 (*)    All other components within normal limits  BRAIN NATRIURETIC PEPTIDE - Abnormal; Notable for the following components:   B Natriuretic Peptide 132.1 (*)    All other components within normal limits  CBC WITH DIFFERENTIAL/PLATELET - Abnormal; Notable for the following components:   WBC 12.4 (*)    RBC 3.64 (*)    Hemoglobin 8.7 (*)    HCT 27.5 (*)    MCV 75.5 (*)    MCH 23.9 (*)    RDW 19.9 (*)    Neutro Abs 10.8 (*)    Lymphs Abs 0.5 (*)    Abs Immature Granulocytes 0.08 (*)    All other components within normal limits  GLUCOSE, CAPILLARY - Abnormal; Notable for the following components:   Glucose-Capillary 201 (*)    All other components within normal limits  SARS CORONAVIRUS 2 (HOSPITAL ORDER, [place] [facility])  TROPONIN I  TROPONIN I  BASIC METABOLIC PANEL  TROPONIN I  TROPONIN I   ____________________________________________  EKG   EKG Interpretation  Date/Time:  Sunday Jan 11 2019 16:14:13 EDT Ventricular Rate:  88 PR Interval:    QRS Duration: 90 QT Interval:  362 QTC Calculation: 438 R Axis:   85 Text Interpretation:  Atrial fibrillation Borderline right axis deviation Low voltage, precordial leads Minimal ST elevation, lateral leads No STEMI.  Confirmed by Nanda Quinton [phone]) on 01/11/2019 4:16:43 PM       ____________________________________________  RADIOLOGY  Dg Chest Portable 1 View  Result Date: 01/11/2019 CLINICAL DATA:  Stable enlarged cardiac silhouette. EXAM: PORTABLE CHEST 1 VIEW COMPARISON:  12/29/2018 FINDINGS: Cardiac silhouette is enlarged compared to prior. No effusion,  infiltrate or pneumothorax. No acute osseous abnormality. IMPRESSION: Enlarged cardiac silhouette representing either new cardiomegaly or pericardial effusion. Electronically Signed   By: Suzy Bouchard M.D.   On: 01/11/2019 16:57    ____________________________________________   PROCEDURES  Procedure(s) performed:   Procedures  None ____________________________________________   INITIAL IMPRESSION / ASSESSMENT AND PLAN / ED COURSE  Pertinent labs & imaging results that were available during my care of the patient were  reviewed by me and considered in my medical decision making (see chart for details).   Patient presents to the emergency department for evaluation of shortness of breath and fatigue.  Notes hypotension yesterday with systolic pressures in the 70s.  Vital signs on arrival are within normal limits.  No hypoxemia.  Patient does have 3+ pitting edema in the lower extremities.  Differential at this time includes CHF, CAD, SARS-CoV-2, pneumonia.  Lower suspicion for PE without significant vital sign abnormalities and patient actively on anticoagulation.  Also low suspicion for COPD exacerbation of the patient does not have wheezing on exam. Not requiring BiPAP or intubation at this time.   07:15 PM  Spoke with cardiology, Dr. Paticia Stack regarding the bedside echo showing possible pericardial effusion.  No tamponade physiology that I can appreciate on my bedside scan.  They recommend admit for diuresis and formal echo in the morning.  Cardiology can formally consult if desired in the AM or if ECHO is abnormal. Discussed with the patient who is in agreement.  Discussed patient's case with Hospitlaist, Dr. Shanon Brow to request admission. Patient and family (if present) updated with plan. Care transferred to Hospitalist service.  I reviewed all nursing notes, vitals, pertinent old records, EKGs, labs, imaging (as available).  ____________________________________________  FINAL CLINICAL  IMPRESSION(S) / ED DIAGNOSES  Final diagnoses:  Shortness of breath     MEDICATIONS GIVEN DURING THIS VISIT:  Medications  senna (SENOKOT) tablet 8.6 mg (8.6 mg Oral Given 01/11/19 2324)  multivitamins with iron tablet 1 tablet (has no administration in time range)  vitamin C (ASCORBIC ACID) tablet 1,000 mg (1,000 mg Oral Given 01/11/19 2325)  guaiFENesin (MUCINEX) 12 hr tablet 600 mg (has no administration in time range)  latanoprost (XALATAN) 0.005 % ophthalmic solution 1 drop (1 drop Both Eyes Not Given 01/11/19 2330)  HYDROcodone-acetaminophen (NORCO/VICODIN) 5-325 MG per tablet 1 tablet (1 tablet Oral Given 01/11/19 2320)  isosorbide mononitrate (IMDUR) 24 hr tablet 15 mg (has no administration in time range)  sodium chloride flush (NS) 0.9 % injection 3 mL (3 mLs Intravenous Given 01/11/19 2334)  sodium chloride flush (NS) 0.9 % injection 3 mL (has no administration in time range)  0.9 %  sodium chloride infusion (has no administration in time range)  acetaminophen (TYLENOL) tablet 650 mg (has no administration in time range)  ondansetron (ZOFRAN) injection 4 mg (4 mg Intravenous Given 01/11/19 2232)  furosemide (LASIX) injection 60 mg (has no administration in time range)  insulin aspart (novoLOG) injection 0-9 Units (has no administration in time range)  insulin aspart (novoLOG) injection 0-5 Units (2 Units Subcutaneous Given 01/11/19 2329)  Melatonin TABS 9-15 mg (15 mg Oral Given 01/11/19 2326)  rivaroxaban (XARELTO) tablet 20 mg (20 mg Oral Given 01/11/19 2324)  ondansetron (ZOFRAN) injection 4 mg (4 mg Intravenous Given 01/11/19 1753)  furosemide (LASIX) injection 60 mg (60 mg Intravenous Given 01/11/19 2029)    Note:  This document was prepared using Dragon voice recognition software and may include unintentional dictation errors.  Nanda Quinton, MD Emergency Medicine    Madelynne Lasker, [facility], MD 01/12/19 [phone]

## 2019-01-11 NOTE — ED Notes (Signed)
ED TO INPATIENT HANDOFF REPORT  ED Nurse Name and Phone #: Salli Real Name/Age/Gender [name] 68 y.o. female Room/Bed: 016C/016C  Code Status   Code Status: Full Code  Home/SNF/Other Home Patient oriented to: self, place, time and situation Is this baseline? Yes   Triage Complete: Triage complete  Chief Complaint SOB; Hypotensive  Triage Note Patient reports increased shortness of breath over the past week, but states it's been going on for a few months - was recently diagnosed with CHF. Denies chest pain, new cough, fevers/chills. Resp e/u.    Allergies Allergies  Allergen Reactions  . Benadryl [Diphenhydramine Hcl] Other (See Comments)    Restless leg  . Clopidogrel Bisulfate Nausea Only    Nausea & pain  . Contrast Media [Iodinated Diagnostic Agents] Palpitations    Rapid heart rate, hot    Level of Care/Admitting Diagnosis ED Disposition    ED Disposition Condition [facility]: [place] [[id number]]  Level of Care: Telemetry Cardiac [103]  I expect the patient will be discharged within 24 hours: Yes  LOW acuity---Tx typically complete <24 hrs---ACUTE conditions typically can be evaluated <24 hours---LABS likely to return to acceptable levels <24 hours---IS near functional baseline---EXPECTED to return to current living arrangement---NOT newly hypoxic: Meets criteria for 5C-Observation unit  Covid Evaluation: Confirmed COVID Negative  Diagnosis: CHF (congestive heart failure) [facility]) [[id number]]  Admitting Physician: Phillips Grout [[id number]]  Attending Physician: Derrill Kay A [[id number]]  PT Class (Do Not Modify): Observation [104]  PT Acc Code (Do Not Modify): Observation [10022]       B Medical/Surgery History Past Medical History:  Diagnosis Date  . Anemia, unspecified   . Arthritis   . Asthma   . Atrial fibrillation with RVR ([place])    a. on Xarelto  . Bell's palsy    Facial nerve decompression in 2001  . CHF  (congestive heart failure) ([place])   . Chronic low back pain   . COPD with asthma ([place])   . Coronary artery disease    Myoview 04/12/11 was entirely normal. ECHO 02/26/08 showed only minor abnormalities. Stenting 05/26/08 of her posterolateral branch to the left circumflex coronary artery. Used a 2.5x57mm Taxus Monorail stent.myoview 2014 was without ischemia  . Diabetes mellitus    Type 2  . Early cataracts, bilateral   . Fatty liver   . GERD (gastroesophageal reflux disease)   . Glaucoma   . Glaucoma   . Goiter   . Heart murmur   . History of nuclear stress test 2012; 2014   lexiscan; normal pattern of perfusion; normal, low risk scan   . Hyperlipidemia   . Hypertension   . Panic disorder   . Pneumonia 2008  . Polycystic ovary disease    Hysterectomy in 1982 for this  . Shortness of breath dyspnea    ECHO 02/26/08 showed only minor abnormalities  . Spinal stenosis    Past Surgical History:  Procedure Laterality Date  . ABDOMINAL HYSTERECTOMY  1982   & BSO; for polycystic ovary disease  . CARDIOVERSION N/A 12/17/2013   Procedure: CARDIOVERSION;  Surgeon: Pixie Casino, MD;  Location: [facility] ENDOSCOPY;  Service: Cardiovascular;  Laterality: N/A;  . CARDIOVERSION N/A 09/30/2015   Procedure: CARDIOVERSION;  Surgeon: Dorothy Spark, MD;  Location: [facility] ENDOSCOPY;  Service: Cardiovascular;  Laterality: N/A;  . CARDIOVERSION N/A 06/22/2016   Procedure: CARDIOVERSION;  Surgeon: Skeet Latch, MD;  Location: [place];  Service: Cardiovascular;  Laterality: N/A;  . COLONOSCOPY     last 2009; Dr Cristina Gong; due 2019  . COLONOSCOPY WITH PROPOFOL N/A 01/23/2018   Procedure: COLONOSCOPY WITH PROPOFOL;  Surgeon: Ronald Lobo, MD;  Location: [place];  Service: Endoscopy;  Laterality: N/A;  . CORONARY ANGIOPLASTY  05/26/2008   Stenting of her posterolateral branch to the left circumflex coronary artery. Used a 2.5x3mm Taxus Monorail stent.  . ESOPHAGOGASTRODUODENOSCOPY (EGD) WITH  PROPOFOL N/A 01/23/2018   Procedure: ESOPHAGOGASTRODUODENOSCOPY (EGD) WITH PROPOFOL;  Surgeon: Ronald Lobo, MD;  Location: [place];  Service: Endoscopy;  Laterality: N/A;  . FACIAL NERVE DECOMPRESSION  2001/2002   bells palsy   . LAPAROSCOPIC CHOLECYSTECTOMY  06/15/2011    Dr Dalbert Batman  . TEE WITHOUT CARDIOVERSION N/A 12/17/2013   Procedure: TRANSESOPHAGEAL ECHOCARDIOGRAM (TEE);  Surgeon: Pixie Casino, MD;  Location: [facility] ENDOSCOPY;  Service: Cardiovascular;  Laterality: N/A;  trish/ja  . TRANSTHORACIC ECHOCARDIOGRAM  11/19/1446   LV systolic function normal with mild conc LVH; LA mildly dilated; trace MR/TR  . UPPER GI ENDOSCOPY  2009   negative     A IV Location/Drains/Wounds Patient Lines/Drains/Airways Status   Active Line/Drains/Airways    Name:   Placement date:   Placement time:   Site:   Days:   Peripheral IV 01/11/19 Right Forearm   01/11/19    1619    Forearm   less than 1          Intake/Output Last 24 hours No intake or output data in the 24 hours ending 01/11/19 2104  Labs/Imaging Results for orders placed or performed during the hospital encounter of 01/11/19 (from the past 48 hour(s))  Comprehensive metabolic panel     Status: Abnormal   Collection Time: 01/11/19  4:08 PM  Result Value Ref Range   Sodium 120 (L) 135 - 145 mmol/L   Potassium 4.5 3.5 - 5.1 mmol/L   Chloride 80 (L) 98 - 111 mmol/L   CO2 21 (L) 22 - 32 mmol/L   Glucose, Bld 217 (H) 70 - 99 mg/dL   BUN 33 (H) 8 - 23 mg/dL   Creatinine, Ser 1.44 (H) 0.44 - 1.00 mg/dL   Calcium 9.4 8.9 - 10.3 mg/dL   Total Protein 6.8 6.5 - 8.1 g/dL   Albumin 3.1 (L) 3.5 - 5.0 g/dL   AST 22 15 - 41 U/L   ALT 16 0 - 44 U/L   Alkaline Phosphatase 76 38 - 126 U/L   Total Bilirubin 0.7 0.3 - 1.2 mg/dL   GFR calc non Af Amer 37 (L) >60 mL/min   GFR calc Af Amer 43 (L) >60 mL/min   Anion gap 19 (H) 5 - 15    Comment: Performed at [facility], [address] [place], [place] [address]  Troponin I - Once      Status: None   Collection Time: 01/11/19  4:08 PM  Result Value Ref Range   Troponin I <0.03 <0.03 ng/mL    Comment: Performed at [facility] [address], [place], [place] [address]  CBC with Differential     Status: Abnormal   Collection Time: 01/11/19  4:08 PM  Result Value Ref Range   WBC 12.4 (H) 4.0 - 10.5 K/uL   RBC 3.64 (L) 3.87 - 5.11 MIL/uL   Hemoglobin 8.7 (L) 12.0 - 15.0 g/dL    Comment: Reticulocyte Hemoglobin testing may be clinically indicated, consider ordering this additional test [id number]    HCT 27.5 (L) 36.0 -  46.0 %   MCV 75.5 (L) 80.0 - 100.0 fL   MCH 23.9 (L) 26.0 - 34.0 pg   MCHC 31.6 30.0 - 36.0 g/dL   RDW 19.9 (H) 11.5 - 15.5 %   Platelets 384 150 - 400 K/uL   nRBC 0.0 0.0 - 0.2 %   Neutrophils Relative % 87 %   Neutro Abs 10.8 (H) 1.7 - 7.7 K/uL   Lymphocytes Relative 4 %   Lymphs Abs 0.5 (L) 0.7 - 4.0 K/uL   Monocytes Relative 8 %   Monocytes Absolute 0.9 0.1 - 1.0 K/uL   Eosinophils Relative 0 %   Eosinophils Absolute 0.0 0.0 - 0.5 K/uL   Basophils Relative 0 %   Basophils Absolute 0.0 0.0 - 0.1 K/uL   Immature Granulocytes 1 %   Abs Immature Granulocytes 0.08 (H) 0.00 - 0.07 K/uL    Comment: Performed at [facility] [address], [place], [place] [address]  Brain natriuretic peptide     Status: Abnormal   Collection Time: 01/11/19  4:18 PM  Result Value Ref Range   B Natriuretic Peptide 132.1 (H) 0.0 - 100.0 pg/mL    Comment: Performed at [facility] [address], [place], [place] [address]  SARS Coronavirus 2 (CEPHEID- Performed in [place] hospital lab), Hosp Order     Status: None   Collection Time: 01/11/19  4:18 PM  Result Value Ref Range   SARS Coronavirus 2 NEGATIVE NEGATIVE    Comment: (NOTE) If result is NEGATIVE SARS-CoV-2 target nucleic acids are NOT DETECTED. The SARS-CoV-2 RNA is generally detectable in upper and lower  respiratory specimens during the acute phase of infection. The lowest   concentration of SARS-CoV-2 viral copies this assay can detect is 250  copies / mL. A negative result does not preclude SARS-CoV-2 infection  and should not be used as the sole basis for treatment or other  patient management decisions.  A negative result may occur with  improper specimen collection / handling, submission of specimen other  than nasopharyngeal swab, presence of viral mutation(s) within the  areas targeted by this assay, and inadequate number of viral copies  (<250 copies / mL). A negative result must be combined with clinical  observations, patient history, and epidemiological information. If result is POSITIVE SARS-CoV-2 target nucleic acids are DETECTED. The SARS-CoV-2 RNA is generally detectable in upper and lower  respiratory specimens dur ing the acute phase of infection.  Positive  results are indicative of active infection with SARS-CoV-2.  Clinical  correlation with patient history and other diagnostic information is  necessary to determine patient infection status.  Positive results do  not rule out bacterial infection or co-infection with other viruses. If result is PRESUMPTIVE POSTIVE SARS-CoV-2 nucleic acids MAY BE PRESENT.   A presumptive positive result was obtained on the submitted specimen  and confirmed on repeat testing.  While 2019 novel coronavirus  (SARS-CoV-2) nucleic acids may be present in the submitted sample  additional confirmatory testing may be necessary for epidemiological  and / or clinical management purposes  to differentiate between  SARS-CoV-2 and other Sarbecovirus currently known to infect humans.  If clinically indicated additional testing with an alternate test  methodology [phone]) is advised. The SARS-CoV-2 RNA is generally  detectable in upper and lower respiratory sp ecimens during the acute  phase of infection. The expected result is Negative. Fact Sheet for Patients:  StrictlyIdeas.no Fact Sheet  for Healthcare Providers: BankingDealers.co.za This test  is not yet approved or cleared by the [place] and has been authorized for detection and/or diagnosis of SARS-CoV-2 by FDA under an Emergency Use Authorization (EUA).  This EUA will remain in effect (meaning this test can be used) for the duration of the COVID-19 declaration under Section 564(b)(1) of the Act, 21 U.S.C. section 360bbb-3(b)(1), unless the authorization is terminated or revoked sooner. Performed at [facility], [place] [address] [place], [place] [address]    Dg Chest Portable 1 View  Result Date: 01/11/2019 CLINICAL DATA:  Stable enlarged cardiac silhouette. EXAM: PORTABLE CHEST 1 VIEW COMPARISON:  12/29/2018 FINDINGS: Cardiac silhouette is enlarged compared to prior. No effusion, infiltrate or pneumothorax. No acute osseous abnormality. IMPRESSION: Enlarged cardiac silhouette representing either new cardiomegaly or pericardial effusion. Electronically Signed   By: Suzy Bouchard M.D.   On: 01/11/2019 16:57    Pending Labs Unresulted Labs (From admission, onward)    Start     Ordered   01/12/19 7425  Basic metabolic panel  Daily,   R     01/11/19 2025   01/11/19 2026  Troponin I - Now Then Q6H  Now then every 6 hours,   R     01/11/19 2025          Vitals/Pain Today's Vitals   01/11/19 1846 01/11/19 2030 01/11/19 2032 01/11/19 2100  BP:  (!) 88/69  99/76  Pulse: 85     Resp: 17 (!) 27  15  Temp:      TempSrc:      SpO2: 96%     Weight:      Height:      PainSc:   0-No pain     Isolation Precautions No active isolations  Medications Medications  senna (SENOKOT) tablet 8.6 mg (has no administration in time range)  multivitamins with iron tablet 1 tablet (has no administration in time range)  vitamin C (ASCORBIC ACID) tablet 1,000 mg (has no administration in time range)  guaiFENesin (MUCINEX) 12 hr tablet 600 mg (has no administration in time range)   latanoprost (XALATAN) 0.005 % ophthalmic solution 1 drop (has no administration in time range)  rivaroxaban (XARELTO) tablet 20 mg (has no administration in time range)  HYDROcodone-acetaminophen (NORCO/VICODIN) 5-325 MG per tablet 1 tablet (has no administration in time range)  isosorbide mononitrate (IMDUR) 24 hr tablet 15 mg (has no administration in time range)  sodium chloride flush (NS) 0.9 % injection 3 mL (has no administration in time range)  sodium chloride flush (NS) 0.9 % injection 3 mL (has no administration in time range)  0.9 %  sodium chloride infusion (has no administration in time range)  acetaminophen (TYLENOL) tablet 650 mg (has no administration in time range)  ondansetron (ZOFRAN) injection 4 mg (has no administration in time range)  furosemide (LASIX) injection 60 mg (has no administration in time range)  insulin aspart (novoLOG) injection 0-9 Units (has no administration in time range)  insulin aspart (novoLOG) injection 0-5 Units (has no administration in time range)  Melatonin TABS 9-15 mg (has no administration in time range)  ondansetron (ZOFRAN) injection 4 mg (4 mg Intravenous Given 01/11/19 1753)  furosemide (LASIX) injection 60 mg (60 mg Intravenous Given 01/11/19 2029)    Mobility walks Low fall risk   Focused Assessments Cardiac Assessment Handoff:  Cardiac Rhythm: Atrial fibrillation Lab Results  Component Value Date   TROPONINI <0.03 01/11/2019   Lab Results  Component Value Date   DDIMER 0.23 11/27/2018  Does the Patient currently have chest pain? No     R Recommendations: See Admitting Provider Note  Report given to:   Additional Notes: n/a

## 2019-01-11 NOTE — ED Notes (Signed)
MD at bedside. 

## 2019-01-11 NOTE — ED Notes (Signed)
Patient assisted to restroom via wheelchair - ambulates without assistance but states walking to restroom earlier made her feel more short of breath.

## 2019-01-12 ENCOUNTER — Other Ambulatory Visit: Payer: Self-pay

## 2019-01-12 ENCOUNTER — Observation Stay (HOSPITAL_COMMUNITY): Payer: PPO

## 2019-01-12 ENCOUNTER — Encounter (HOSPITAL_COMMUNITY): Payer: Self-pay | Admitting: Nurse Practitioner

## 2019-01-12 DIAGNOSIS — R9431 Abnormal electrocardiogram [ECG] [EKG]: Secondary | ICD-10-CM | POA: Diagnosis not present

## 2019-01-12 DIAGNOSIS — N179 Acute kidney failure, unspecified: Secondary | ICD-10-CM | POA: Insufficient documentation

## 2019-01-12 DIAGNOSIS — R0602 Shortness of breath: Secondary | ICD-10-CM | POA: Diagnosis present

## 2019-01-12 DIAGNOSIS — I313 Pericardial effusion (noninflammatory): Secondary | ICD-10-CM | POA: Diagnosis present

## 2019-01-12 DIAGNOSIS — E785 Hyperlipidemia, unspecified: Secondary | ICD-10-CM | POA: Diagnosis present

## 2019-01-12 DIAGNOSIS — R570 Cardiogenic shock: Secondary | ICD-10-CM | POA: Diagnosis present

## 2019-01-12 DIAGNOSIS — E282 Polycystic ovarian syndrome: Secondary | ICD-10-CM | POA: Diagnosis present

## 2019-01-12 DIAGNOSIS — Z20828 Contact with and (suspected) exposure to other viral communicable diseases: Secondary | ICD-10-CM | POA: Diagnosis present

## 2019-01-12 DIAGNOSIS — E871 Hypo-osmolality and hyponatremia: Secondary | ICD-10-CM | POA: Diagnosis present

## 2019-01-12 DIAGNOSIS — I110 Hypertensive heart disease with heart failure: Secondary | ICD-10-CM | POA: Diagnosis present

## 2019-01-12 DIAGNOSIS — I2510 Atherosclerotic heart disease of native coronary artery without angina pectoris: Secondary | ICD-10-CM | POA: Diagnosis present

## 2019-01-12 DIAGNOSIS — I5033 Acute on chronic diastolic (congestive) heart failure: Secondary | ICD-10-CM | POA: Diagnosis present

## 2019-01-12 DIAGNOSIS — G8929 Other chronic pain: Secondary | ICD-10-CM | POA: Diagnosis present

## 2019-01-12 DIAGNOSIS — M545 Low back pain: Secondary | ICD-10-CM | POA: Diagnosis present

## 2019-01-12 DIAGNOSIS — R197 Diarrhea, unspecified: Secondary | ICD-10-CM | POA: Diagnosis present

## 2019-01-12 DIAGNOSIS — J9811 Atelectasis: Secondary | ICD-10-CM | POA: Diagnosis present

## 2019-01-12 DIAGNOSIS — N390 Urinary tract infection, site not specified: Secondary | ICD-10-CM | POA: Diagnosis present

## 2019-01-12 DIAGNOSIS — N170 Acute kidney failure with tubular necrosis: Secondary | ICD-10-CM | POA: Diagnosis present

## 2019-01-12 DIAGNOSIS — Z7901 Long term (current) use of anticoagulants: Secondary | ICD-10-CM | POA: Diagnosis not present

## 2019-01-12 DIAGNOSIS — R001 Bradycardia, unspecified: Secondary | ICD-10-CM | POA: Diagnosis present

## 2019-01-12 DIAGNOSIS — J449 Chronic obstructive pulmonary disease, unspecified: Secondary | ICD-10-CM | POA: Diagnosis present

## 2019-01-12 DIAGNOSIS — K219 Gastro-esophageal reflux disease without esophagitis: Secondary | ICD-10-CM | POA: Diagnosis present

## 2019-01-12 DIAGNOSIS — E119 Type 2 diabetes mellitus without complications: Secondary | ICD-10-CM | POA: Diagnosis present

## 2019-01-12 DIAGNOSIS — K625 Hemorrhage of anus and rectum: Secondary | ICD-10-CM | POA: Diagnosis present

## 2019-01-12 DIAGNOSIS — I4821 Permanent atrial fibrillation: Secondary | ICD-10-CM | POA: Diagnosis present

## 2019-01-12 DIAGNOSIS — M1990 Unspecified osteoarthritis, unspecified site: Secondary | ICD-10-CM | POA: Diagnosis present

## 2019-01-12 DIAGNOSIS — D500 Iron deficiency anemia secondary to blood loss (chronic): Secondary | ICD-10-CM | POA: Diagnosis not present

## 2019-01-12 DIAGNOSIS — I3139 Other pericardial effusion (noninflammatory): Secondary | ICD-10-CM | POA: Diagnosis present

## 2019-01-12 DIAGNOSIS — K760 Fatty (change of) liver, not elsewhere classified: Secondary | ICD-10-CM | POA: Diagnosis present

## 2019-01-12 DIAGNOSIS — I314 Cardiac tamponade: Secondary | ICD-10-CM | POA: Diagnosis present

## 2019-01-12 DIAGNOSIS — H409 Unspecified glaucoma: Secondary | ICD-10-CM | POA: Diagnosis present

## 2019-01-12 LAB — BASIC METABOLIC PANEL
Anion gap: 15 (ref 5–15)
BUN: 39 mg/dL — ABNORMAL HIGH (ref 8–23)
CO2: 20 mmol/L — ABNORMAL LOW (ref 22–32)
Calcium: 8.9 mg/dL (ref 8.9–10.3)
Chloride: 82 mmol/L — ABNORMAL LOW (ref 98–111)
Creatinine, Ser: 2.22 mg/dL — ABNORMAL HIGH (ref 0.44–1.00)
GFR calc Af Amer: 25 mL/min — ABNORMAL LOW (ref >60)
GFR calc non Af Amer: 22 mL/min — ABNORMAL LOW (ref >60)
Glucose, Bld: 207 mg/dL — ABNORMAL HIGH (ref 70–99)
Potassium: 5.6 mmol/L — ABNORMAL HIGH (ref 3.5–5.1)
Sodium: 117 mmol/L — CL (ref 135–145)

## 2019-01-12 LAB — CBC
HCT: 27.8 % — ABNORMAL LOW (ref 36.0–46.0)
HCT: 27.3 % — ABNORMAL LOW (ref 36.0–46.0)
Hemoglobin: 9.2 g/dL — ABNORMAL LOW (ref 12.0–15.0)
Hemoglobin: 9 g/dL — ABNORMAL LOW (ref 12.0–15.0)
MCH: 24.6 pg — ABNORMAL LOW (ref 26.0–34.0)
MCH: 24.3 pg — ABNORMAL LOW (ref 26.0–34.0)
MCHC: 33.1 g/dL (ref 30.0–36.0)
MCHC: 33 g/dL (ref 30.0–36.0)
MCV: 74.3 fL — ABNORMAL LOW (ref 80.0–100.0)
MCV: 73.6 fL — ABNORMAL LOW (ref 80.0–100.0)
Platelets: 511 10*3/uL — ABNORMAL HIGH (ref 150–400)
Platelets: 485 10*3/uL — ABNORMAL HIGH (ref 150–400)
RBC: 3.74 MIL/uL — ABNORMAL LOW (ref 3.87–5.11)
RBC: 3.71 MIL/uL — ABNORMAL LOW (ref 3.87–5.11)
RDW: 19.8 % — ABNORMAL HIGH (ref 11.5–15.5)
RDW: 19.8 % — ABNORMAL HIGH (ref 11.5–15.5)
WBC: 16.7 10*3/uL — ABNORMAL HIGH (ref 4.0–10.5)
WBC: 14.5 10*3/uL — ABNORMAL HIGH (ref 4.0–10.5)
nRBC: 0 % (ref 0.0–0.2)
nRBC: 0 % (ref 0.0–0.2)

## 2019-01-12 LAB — SODIUM, URINE, RANDOM: Sodium, Ur: 23 mmol/L

## 2019-01-12 LAB — OCCULT BLOOD X 1 CARD TO LAB, STOOL: Fecal Occult Bld: NEGATIVE

## 2019-01-12 LAB — ECHOCARDIOGRAM COMPLETE
Height: 62 in
Weight: 3251.2 oz

## 2019-01-12 LAB — OSMOLALITY, URINE: Osmolality, Ur: 258 mOsm/kg — ABNORMAL LOW (ref 300–900)

## 2019-01-12 LAB — TROPONIN I
Troponin I: <0.03 ng/mL (ref <0.03)
Troponin I: <0.03 ng/mL (ref <0.03)
Troponin I: <0.03 ng/mL (ref <0.03)

## 2019-01-12 LAB — GLUCOSE, CAPILLARY
Glucose-Capillary: 166 mg/dL — ABNORMAL HIGH (ref 70–99)
Glucose-Capillary: 171 mg/dL — ABNORMAL HIGH (ref 70–99)
Glucose-Capillary: 155 mg/dL — ABNORMAL HIGH (ref 70–99)
Glucose-Capillary: 171 mg/dL — ABNORMAL HIGH (ref 70–99)

## 2019-01-12 LAB — OSMOLALITY: Osmolality: 262 mOsm/kg — ABNORMAL LOW (ref 275–295)

## 2019-01-12 MED ORDER — ALBUMIN HUMAN 5 % IV SOLN
12.5000 g | Freq: Once | INTRAVENOUS | Status: AC
  Administered 2019-01-12: 12.5 g via INTRAVENOUS
  Filled 2019-01-12: qty 250

## 2019-01-12 MED ORDER — ALBUMIN HUMAN 5 % IV SOLN
12.5000 g | Freq: Once | INTRAVENOUS | Status: AC
  Administered 2019-01-13: 12.5 g via INTRAVENOUS
  Filled 2019-01-12: qty 250

## 2019-01-12 MED ORDER — PANTOPRAZOLE SODIUM 40 MG IV SOLR
40.0000 mg | Freq: Two times a day (BID) | INTRAVENOUS | Status: DC
  Administered 2019-01-12 – 2019-01-15 (×7): 40 mg via INTRAVENOUS
  Filled 2019-01-12 (×7): qty 40

## 2019-01-12 MED ORDER — SODIUM CHLORIDE 0.9 % IV BOLUS
250.0000 mL | Freq: Once | INTRAVENOUS | Status: AC
  Administered 2019-01-12: 250 mL via INTRAVENOUS

## 2019-01-12 NOTE — Progress Notes (Signed)
Patient had small bowel movement, no frank blood noted in stool. Small amount of bright red blood noted on toilet paper. Stool sent for lab testing. Patient also stated that her finger tips are occasionally turning purple. Dr. Acie Fredrickson in room and aware when she pointed this out.

## 2019-01-12 NOTE — Progress Notes (Signed)
Patient states that she has had at least 8 loose, watery bowel movements today. Patient states there is large amount of bright red blood in toilet as well. Barrier cream given to patient. Patient told to let RN see all bowel movements before she flushes. Dyanne Carrel, NP made aware. Will continue to monitor.

## 2019-01-12 NOTE — Consult Note (Signed)
Cardiology Consultation:   Patient ID: NEGIN HEGG MRN: [id number]; DOB: 1950-08-21  Admit date: 01/11/2019 Date of Consult: 01/12/2019  Primary Care Provider: Hoyt Koch, MD Primary Cardiologist: Quay Burow, MD  Primary Electrophysiologist:  None    Patient Profile:   [name] is a 68 y.o. female with a hx of chronic diastolic congestive heart failure,  Coronary artery disease, Diabetes mellitus,  Hypertension who is being seen today for the evaluation of acute exacerbation of her congestive heart failure at the request of  Dr. Wyonia Hough.  History of Present Illness:   [name] has a history of chronic diastolic congestive heart failure.  She was last seen by Dr. Gwenlyn Found last week.   She was volume overloaded at that time.  Her torsemide was increased from 40 mg once a day to twice a day.  She was to follow-up in the office in several weeks. She admits to not being compliant with her salt intake   Her leg edema improved for several days as did her breathing but then she stopped improving  Creatinine has increased from 1.44 up to 2.22 overnight   Echo has been done    Still eats some processed meats and canned veggies, / soup  BP has been low   Past Medical History:  Diagnosis Date  . Anemia, unspecified   . Arthritis   . Asthma   . Atrial fibrillation with RVR ([place])    a. on Xarelto  . Bell's palsy    Facial nerve decompression in 2001  . CHF (congestive heart failure) ([place])   . Chronic low back pain   . COPD with asthma ([place])   . Coronary artery disease    Myoview 04/12/11 was entirely normal. ECHO 02/26/08 showed only minor abnormalities. Stenting 05/26/08 of her posterolateral branch to the left circumflex coronary artery. Used a 2.5x69mm Taxus Monorail stent.myoview 2014 was without ischemia  . Diabetes mellitus    Type 2  . Early cataracts, bilateral   . Fatty liver   . GERD (gastroesophageal reflux disease)   . Glaucoma   . Glaucoma   .  Goiter   . Heart murmur   . History of nuclear stress test 2012; 2014   lexiscan; normal pattern of perfusion; normal, low risk scan   . Hyperlipidemia   . Hypertension   . Panic disorder   . Pneumonia 2008  . Polycystic ovary disease    Hysterectomy in 1982 for this  . Shortness of breath dyspnea    ECHO 02/26/08 showed only minor abnormalities  . Spinal stenosis     Past Surgical History:  Procedure Laterality Date  . ABDOMINAL HYSTERECTOMY  1982   & BSO; for polycystic ovary disease  . CARDIOVERSION N/A 12/17/2013   Procedure: CARDIOVERSION;  Surgeon: Pixie Casino, MD;  Location: [facility] ENDOSCOPY;  Service: Cardiovascular;  Laterality: N/A;  . CARDIOVERSION N/A 09/30/2015   Procedure: CARDIOVERSION;  Surgeon: Dorothy Spark, MD;  Location: [facility] ENDOSCOPY;  Service: Cardiovascular;  Laterality: N/A;  . CARDIOVERSION N/A 06/22/2016   Procedure: CARDIOVERSION;  Surgeon: Skeet Latch, MD;  Location: [facility];  Service: Cardiovascular;  Laterality: N/A;  . COLONOSCOPY     last 2009; Dr Cristina Gong; due 2019  . COLONOSCOPY WITH PROPOFOL N/A 01/23/2018   Procedure: COLONOSCOPY WITH PROPOFOL;  Surgeon: Ronald Lobo, MD;  Location: [place];  Service: Endoscopy;  Laterality: N/A;  . CORONARY ANGIOPLASTY  05/26/2008   Stenting of her posterolateral branch to the left circumflex  coronary artery. Used a 2.5x29mm Taxus Monorail stent.  . ESOPHAGOGASTRODUODENOSCOPY (EGD) WITH PROPOFOL N/A 01/23/2018   Procedure: ESOPHAGOGASTRODUODENOSCOPY (EGD) WITH PROPOFOL;  Surgeon: Ronald Lobo, MD;  Location: [place];  Service: Endoscopy;  Laterality: N/A;  . FACIAL NERVE DECOMPRESSION  2001/2002   bells palsy   . LAPAROSCOPIC CHOLECYSTECTOMY  06/15/2011    Dr Dalbert Batman  . TEE WITHOUT CARDIOVERSION N/A 12/17/2013   Procedure: TRANSESOPHAGEAL ECHOCARDIOGRAM (TEE);  Surgeon: Pixie Casino, MD;  Location[facility] ENDOSCOPY;  Service: Cardiovascular;  Laterality: N/A;  trish/ja  . TRANSTHORACIC  ECHOCARDIOGRAM  1/32/4401   LV systolic function normal with mild conc LVH; LA mildly dilated; trace MR/TR  . UPPER GI ENDOSCOPY  2009   negative     Home Medications:  Prior to Admission medications   Medication Sig Start Date End Date Taking? Authorizing Provider  ascorbic acid (VITAMIN C) 1000 MG tablet Take 1,000 mg by mouth 2 (two) times daily.    Yes [provider]  CALCIUM-MAG-VIT C-VIT D PO Take 1 tablet by mouth daily.    Yes [provider]  celecoxib (CELEBREX) 200 MG capsule Take 1 capsule (200 mg total) by mouth 2 (two) times daily. 01/02/19  Yes Hoyt Koch, MD  diltiazem (CARDIZEM CD) 180 MG 24 hr capsule TAKE 1 CAPSULE DAILY. Patient taking differently: Take 180 mg by mouth daily.  12/15/18  Yes Lorretta Harp, MD  gabapentin (NEURONTIN) 100 MG capsule TAKE (1) CAPSULE THREE TIMES DAILY. Patient taking differently: Take 100 mg by mouth 3 (three) times daily.  01/06/19  Yes Hoyt Koch, MD  guaiFENesin (MUCINEX) 600 MG 12 hr tablet Take 600 mg by mouth 2 (two) times daily as needed for cough.    Yes [provider]  HYDROcodone-acetaminophen (NORCO/VICODIN) 5-325 MG tablet Take 1 tablet by mouth at bedtime. 01/02/19  Yes Hoyt Koch, MD  INVOKANA 100 MG TABS tablet TAKE 1 TABLET ONCE DAILY. Patient taking differently: Take 100 mg by mouth daily before breakfast.  09/22/18  Yes Hoyt Koch, MD  isosorbide mononitrate (IMDUR) 30 MG 24 hr tablet Take 0.5 tablets (15 mg total) by mouth daily. 01/06/19  Yes Lorretta Harp, MD  latanoprost (XALATAN) 0.005 % ophthalmic solution Place 1 drop into both eyes at bedtime. 12/09/13  Yes [provider]  LORazepam (ATIVAN) 0.5 MG tablet TAKE 1 OR 2 TABLETS AT BEDTIME. Patient taking differently: Take 0.5-1 mg by mouth at bedtime.  10/14/18  Yes Hoyt Koch, MD  Melatonin 5 MG TABS Take 2-3 tablets by mouth at bedtime.    Yes [provider]   metoprolol succinate (TOPROL-XL) 25 MG 24 hr tablet TAKE ONE TABLET AT BEDTIME. Patient taking differently: Take 25 mg by mouth as needed. Take as needed for palpitations 12/02/18  Yes Sherran Needs, NP  metoprolol succinate (TOPROL-XL) 50 MG 24 hr tablet Take 100 mg by mouth daily.  12/14/18  Yes [provider]  Multiple Vitamins-Iron (MULTIVITAMINS WITH IRON) TABS Take 1 tablet by mouth daily.    Yes [provider]  nystatin ointment (MYCOSTATIN) Apply 1 application topically 2 (two) times daily as needed (IRRITATION).  07/09/16  Yes [provider]  omeprazole (PRILOSEC) 20 MG capsule Take 1 capsule (20 mg total) by mouth 2 (two) times daily before a meal. 12/31/18  Yes Vann, Jessica U, DO  PARoxetine (PAXIL) 10 MG tablet TAKE 2 AND 1/2 TABS DAILY Patient taking differently: Take 25 mg by mouth daily.  10/13/18  Yes Hoyt Koch, MD  quinapril (ACCUPRIL) 40 MG tablet TAKE 1 TABLET ONCE DAILY. Patient taking differently: Take 40 mg by mouth at bedtime.  09/18/18  Yes Hoyt Koch, MD  rOPINIRole (REQUIP XL) 2 MG 24 hr tablet TAKE ONE TABLET AT BEDTIME. Patient taking differently: Take 2 mg by mouth at bedtime.  07/24/18  Yes Dohmeier, Asencion Partridge, MD  rOPINIRole (REQUIP) 0.5 MG tablet Take 1 to 2 tab at midnight. ( XL form at 6 PM) Patient taking differently: Take 0.5-1 mg by mouth daily as needed (restless legs).  06/30/18  Yes Dohmeier, Asencion Partridge, MD  rosuvastatin (CRESTOR) 10 MG tablet TAKE ONE TABLET AT BEDTIME. Patient taking differently: Take 10 mg by mouth daily.  10/21/18  Yes Lorretta Harp, MD  senna (SENOKOT) 8.6 MG TABS Take 1 tablet by mouth at bedtime.    Yes [provider]  sitaGLIPtin-metformin (JANUMET) 50-1000 MG tablet TAKE 1/2 TABLET IN THE MORNING WITH BREAKFAST AND 1 TABLET IN THE EVENING WITH SUPPER. Patient taking differently: Take 0.5-1 tablets by mouth See admin instructions. TAKE 1/2 TABLET IN THE MORNING WITH BREAKFAST AND  1 TABLET IN THE EVENING WITH SUPPER. 11/17/18  Yes Hoyt Koch, MD  Tiotropium Bromide-Olodaterol (STIOLTO RESPIMAT) 2.5-2.5 MCG/ACT AERS Inhale 2 puffs into the lungs daily. 12/01/18  Yes Young, Tarri Fuller D, MD  torsemide (DEMADEX) 20 MG tablet Take 2 tablets (40 mg total) by mouth 2 (two) times daily. 01/06/19  Yes Lorretta Harp, MD  XARELTO 20 MG TABS tablet TAKE 1 TABLET ONCE DAILY WITH SUPPER. Patient taking differently: Take 20 mg by mouth daily with supper.  11/25/18  Yes Lorretta Harp, MD  Continuous Blood Gluc Sensor ([place]) MISC Use to monitor sugars 04/23/17   Hoyt Koch, MD  [facility] DELICA LANCETS FINE MISC 1 Units by Does not apply route as directed. 10/05/11   Midge Minium, MD    Inpatient Medications: Scheduled Meds: . HYDROcodone-acetaminophen  1 tablet Oral QHS  . insulin aspart  0-5 Units Subcutaneous QHS  . insulin aspart  0-9 Units Subcutaneous TID WC  . latanoprost  1 drop Both Eyes QHS  . Melatonin  9-15 mg Oral QHS  . multivitamins with iron  1 tablet Oral Daily  . rivaroxaban  20 mg Oral Q supper  . senna  1 tablet Oral QHS  . sodium chloride flush  3 mL Intravenous Q12H  . ascorbic acid  1,000 mg Oral BID   Continuous Infusions: . sodium chloride     PRN Meds: sodium chloride, acetaminophen, guaiFENesin, ondansetron (ZOFRAN) IV, sodium chloride flush  Allergies:    Allergies  Allergen Reactions  . Benadryl [Diphenhydramine Hcl] Other (See Comments)    Restless leg  . Clopidogrel Bisulfate Nausea Only    Nausea & pain  . Contrast Media [Iodinated Diagnostic Agents] Palpitations    Rapid heart rate, hot    Social History:   Social History   Socioeconomic History  . Marital status: Married    Spouse name: Not on file  . Number of children: 2  . Years of education: master's  . Highest education level: Not on file  Occupational History  . Occupation: Forensic scientist: [employer]  . Financial resource strain: Not on file  . Food insecurity:    Worry: Not on file    Inability: Not on file  . Transportation needs:  Medical: Not on file    Non-medical: Not on file  Tobacco Use  . Smoking status: Former Smoker    Packs/day: 1.50    Years: 30.00    Pack years: 45.00    Types: Cigarettes    Last attempt to quit: 08/13/1993    Years since quitting: 25.4  . Smokeless tobacco: Never Used  Substance and Sexual Activity  . Alcohol use: No  . Drug use: No  . Sexual activity: Not on file  Lifestyle  . Physical activity:    Days per week: Not on file    Minutes per session: Not on file  . Stress: Not on file  Relationships  . Social connections:    Talks on phone: Not on file    Gets together: Not on file    Attends religious service: Not on file    Active member of club or organization: Not on file    Attends meetings of clubs or organizations: Not on file    Relationship status: Not on file  . Intimate partner violence:    Fear of current or ex partner: Not on file    Emotionally abused: Not on file    Physically abused: Not on file    Forced sexual activity: Not on file  Other Topics Concern  . Not on file  Social History Narrative  . Not on file    Family History:    Family History  Problem Relation Age of Onset  . Heart attack Father 79       2nd MI at 99  . Colon cancer Brother 76  . Gout Brother   . Ulcers Mother   . Emphysema Mother 55  . Colon polyps Sister   . Cancer Sister        Basal cell carcinoma  . Pneumonia Maternal Grandmother   . Hypertension Brother   . Hyperlipidemia Brother   . Cancer Brother        Skin  . Diabetes Neg Hx   . Stroke Neg Hx      ROS:  Please see the history of present illness.   All other ROS reviewed and negative.     Physical Exam/Data:   Vitals:   01/12/19 0847 01/12/19 0854 01/12/19 1216 01/12/19 1220  BP: (!) 78/50 (!) 76/42 (!) 77/57 (!) 100/58  Pulse: 74 74 90    Resp: 18 18 16    Temp: 97.7 F (36.5 C) 97.7 F (36.5 C) 98.3 F (36.8 C)   TempSrc: Oral Oral Oral   SpO2:  96% 99%   Weight:      Height:        Intake/Output Summary (Last 24 hours) at 01/12/2019 1404 Last data filed at 01/12/2019 0929 Gross per 24 hour  Intake 123 ml  Output -  Net 123 ml   Last 3 Weights 01/12/2019 01/11/2019 01/11/2019  Weight (lbs) 203 lb 3.2 oz 202 lb 1.6 oz 195 lb  Weight (kg) 92.171 kg 91.672 kg 88.451 kg     Body mass index is 37.17 kg/m.  General:  Elderly female,   Appears fatigued,  NAD  HEENT: normal Lymph: no adenopathy Neck: no JVD Endocrine:  No thryomegaly Vascular: No carotid bruits; FA pulses 2+ bilaterally without bruits  Cardiac:  Irreg..  Irreg.    Lungs:  clear to auscultation bilaterally, no wheezing, rhonchi or rales  Abd: soft, nontender, no hepatomegaly  Ext: no edema Musculoskeletal:  No deformities, BUE and BLE strength normal and equal  Skin: warm and dry  Neuro:  CNs 2-12 intact, no focal abnormalities noted Psych:  Normal affect   EKG:  The EKG was personally reviewed and demonstrates:   Atrial fib with controlled V response  Telemetry:  Telemetry was personally reviewed and demonstrates:  AF bi   Relevant CV Studies:    Laboratory Data:  Chemistry Recent Labs  Lab 01/06/19 1342 01/11/19 1608 01/12/19 0254  NA 122* 120* 117*  K 4.2 4.5 5.6*  CL 85* 80* 82*  CO2 27 21* 20*  GLUCOSE 139* 217* 207*  BUN 31* 33* 39*  CREATININE 1.33* 1.44* 2.22*  CALCIUM 9.0 9.4 8.9  GFRNONAA  --  37* 22*  GFRAA  --  43* 25*  ANIONGAP  --  19* 15    Recent Labs  Lab 01/06/19 1342 01/11/19 1608  PROT 6.9 6.8  ALBUMIN 3.7 3.1*  AST 16 22  ALT 11 16  ALKPHOS 69 76  BILITOT 0.5 0.7   Hematology Recent Labs  Lab 01/06/19 1342 01/11/19 1608  WBC 10.3 12.4*  RBC 3.35* 3.64*  HGB 8.5 Repeated and verified X2.* 8.7*  HCT 25.4 Repeated and verified X2.* 27.5*  MCV 75.8* 75.5*  MCH  --  23.9*  MCHC 33.3 31.6  RDW 19.5*  19.9*  PLT 293.0 384   Cardiac Enzymes Recent Labs  Lab 01/11/19 1608 01/11/19 2301 01/12/19 0254 01/12/19 0833  TROPONINI <0.03 <0.03 <0.03 <0.03   No results for input(s): TROPIPOC in the last 168 hours.  BNP Recent Labs  Lab 01/11/19 1618  BNP 132.1*    DDimer No results for input(s): DDIMER in the last 168 hours.  Radiology/Studies:  Dg Chest Portable 1 View  Result Date: 01/11/2019 CLINICAL DATA:  Stable enlarged cardiac silhouette. EXAM: PORTABLE CHEST 1 VIEW COMPARISON:  12/29/2018 FINDINGS: Cardiac silhouette is enlarged compared to prior. No effusion, infiltrate or pneumothorax. No acute osseous abnormality. IMPRESSION: Enlarged cardiac silhouette representing either new cardiomegaly or pericardial effusion. Electronically Signed   By: Suzy Bouchard M.D.   On: 01/11/2019 16:57    Assessment and Plan:   1. Chronic diastolic CHF :   Is actually volume depleted today .   Creatinine is acutely elevated,  BP is low,  Feeling fatigued.  Urine output was initially good, but has trailed off over the past week.   BMP is ~ normal   Agree with holding diuresis at this point .  IV albumin  Echo has been ordered.   2.   Acute renal insufficiency :   Agree with holding torsemide,    IV albumin Metformin , accupril , Diltiazem are on hold         For questions or updates, please contact [place] Please consult [email or web address] for contact info under     Signed, Mertie Moores, MD  01/12/2019 2:04 PM

## 2019-01-12 NOTE — Progress Notes (Signed)
Orders for patient to transfer to 6E02.  Report given to [place] and patient transferred.

## 2019-01-12 NOTE — Progress Notes (Signed)
Pt arrived on unit with BP 95/40- finger tips and toes purple in color and very cold. Pt is complaining of being extremely cold and very exhausted. O2 sats could only be taken from pt forehead due to fingertips being cold and are not reliable going from 70's to 90's without a constant pleth. Will apply O2  I paged Cardiology on call with these findings

## 2019-01-12 NOTE — Progress Notes (Addendum)
[facility] PROGRESS NOTE  [name] LNL:[id number] DOB: 01/25/1951 DOA: 01/11/2019 PCP: Hoyt Koch, MD  Assessment/Plan:  Acute on chronic diastolic CHF (congestive heart failure) (HCC)-with pericardial effusion.  Placed on Lasix 60 mg IV every 12 hours. Urine output undocumented. BP 74 this am. No events on tele. Continues with pitting edema. Troponin neg x 3 -stop IV lasix due to SBP 74 -follow echo -250cc bolus NS -hold home antihypertensive meds -monitor closely -await cards input  Active Problems:  Hyponatremia: serum glucose 117 on admission. In setting of intermittent nausea/vomiting/decreased oral intake and more recent diarrhea. No recent antibiotics. Query labile BP.  -iv fluids as noted above -stop lasix -urine sodium and osmolality -serum osmolality -monitor closely -improved BP control  Acute kidney injury: creatinine 2.2 this am trending up from 1.4 on admission. No documented hx ckd. Home meds include ace inhibitor, torsemide in setting of recent diarrhea and vomiting -holding nephrotoxins -stop IV lasix as noted above -monitor urine output -small bolus NS -monitor closely    Type 2 diabetes mellitus with vascular disease (HCC) serum glucose 217 on admission. A1C 6.5 2/20.  Home meds include invokana. -sliding scale insulin - holding orals    Essential hypertension-patient blood pressure soft on admission and this am. Holding beta-blocker and calcium channel blocker along with ACE inhibitor. -see #1 -provide albumin    CAD S/P percutaneous coronary angioplasty-stable    COPD mixed type (HCC)-compensated and stable at this time lung exam clear    Permanent atrial fibrillation-continue Xarelto monitor rate on telemetry     Code Status: note Family Communication: spouse on phone Disposition Plan: home when ready   Consultants:  cardmaster  Procedures:  echo  Antibiotics:  HPI/Subjective: Sitting on side of bed  denies pain/discomfort  Objective: Vitals:   01/12/19 0847 01/12/19 0854  BP: (!) 78/50 (!) 76/42  Pulse: 74 74  Resp: 18 18  Temp: 97.7 F (36.5 C) 97.7 F (36.5 C)  SpO2:  96%    Intake/Output Summary (Last 24 hours) at 01/12/2019 0952 Last data filed at 01/12/2019 0850 Gross per 24 hour  Intake 3 ml  Output -  Net 3 ml   Filed Weights   01/11/19 1609 01/11/19 2210 01/12/19 0457  Weight: 88.5 kg 91.7 kg 92.2 kg    Exam:   General:  Awake alert in no acute distress  Cardiovascular: irregularly irregular no mgr 1+LE edema  Respiratory: normal effort BS distant but clear no crackles or wheezes  Abdomen: obese soft +BS no guarding or rebounding  Musculoskeletal: joints without swelling/erythema   Data Reviewed: Basic Metabolic Panel: Recent Labs  Lab 01/06/19 1342 01/11/19 1608 01/12/19 0254  NA 122* 120* 117*  K 4.2 4.5 5.6*  CL 85* 80* 82*  CO2 27 21* 20*  GLUCOSE 139* 217* 207*  BUN 31* 33* 39*  CREATININE 1.33* 1.44* 2.22*  CALCIUM 9.0 9.4 8.9   Liver Function Tests: Recent Labs  Lab 01/06/19 1342 01/11/19 1608  AST 16 22  ALT 11 16  ALKPHOS 69 76  BILITOT 0.5 0.7  PROT 6.9 6.8  ALBUMIN 3.7 3.1*   No results for input(s): LIPASE, AMYLASE in the last 168 hours. No results for input(s): AMMONIA in the last 168 hours. CBC: Recent Labs  Lab 01/06/19 1342 01/11/19 1608  WBC 10.3 12.4*  NEUTROABS  --  10.8*  HGB 8.5 Repeated and verified X2.* 8.7*  HCT 25.4 Repeated and verified X2.* 27.5*  MCV 75.8* 75.5*  PLT 293.0  384   Cardiac Enzymes: Recent Labs  Lab 01/11/19 1608 01/11/19 2301 01/12/19 0254  TROPONINI <0.03 <0.03 <0.03   BNP (last 3 results) Recent Labs    12/29/18 2152 01/11/19 1618  BNP 277.4* 132.1*    ProBNP (last 3 results) Recent Labs    11/27/18 1547  PROBNP 219.0*    CBG: Recent Labs  Lab 01/11/19 2216 01/12/19 0617  GLUCAP 201* 166*    Recent Results (from the past 240 hour(s))  SARS Coronavirus 2  (CEPHEID- Performed in [place] hospital lab), Hosp Order     Status: None   Collection Time: 01/11/19  4:18 PM  Result Value Ref Range Status   SARS Coronavirus 2 NEGATIVE NEGATIVE Final    Comment: (NOTE) If result is NEGATIVE SARS-CoV-2 target nucleic acids are NOT DETECTED. The SARS-CoV-2 RNA is generally detectable in upper and lower  respiratory specimens during the acute phase of infection. The lowest  concentration of SARS-CoV-2 viral copies this assay can detect is 250  copies / mL. A negative result does not preclude SARS-CoV-2 infection  and should not be used as the sole basis for treatment or other  patient management decisions.  A negative result may occur with  improper specimen collection / handling, submission of specimen other  than nasopharyngeal swab, presence of viral mutation(s) within the  areas targeted by this assay, and inadequate number of viral copies  (<250 copies / mL). A negative result must be combined with clinical  observations, patient history, and epidemiological information. If result is POSITIVE SARS-CoV-2 target nucleic acids are DETECTED. The SARS-CoV-2 RNA is generally detectable in upper and lower  respiratory specimens dur ing the acute phase of infection.  Positive  results are indicative of active infection with SARS-CoV-2.  Clinical  correlation with patient history and other diagnostic information is  necessary to determine patient infection status.  Positive results do  not rule out bacterial infection or co-infection with other viruses. If result is PRESUMPTIVE POSTIVE SARS-CoV-2 nucleic acids MAY BE PRESENT.   A presumptive positive result was obtained on the submitted specimen  and confirmed on repeat testing.  While 2019 novel coronavirus  (SARS-CoV-2) nucleic acids may be present in the submitted sample  additional confirmatory testing may be necessary for epidemiological  and / or clinical management purposes  to differentiate  between  SARS-CoV-2 and other Sarbecovirus currently known to infect humans.  If clinically indicated additional testing with an alternate test  methodology [phone]) is advised. The SARS-CoV-2 RNA is generally  detectable in upper and lower respiratory sp ecimens during the acute  phase of infection. The expected result is Negative. Fact Sheet for Patients:  StrictlyIdeas.no Fact Sheet for Healthcare Providers: BankingDealers.co.za This test is not yet approved or cleared by the [place] FDA and has been authorized for detection and/or diagnosis of SARS-CoV-2 by FDA under an Emergency Use Authorization (EUA).  This EUA will remain in effect (meaning this test can be used) for the duration of the COVID-19 declaration under Section 564(b)(1) of the Act, 21 U.S.C. section 360bbb-3(b)(1), unless the authorization is terminated or revoked sooner. Performed at [facility], [place] [address] [place],  [address]      Studies: Dg Chest Portable 1 View  Result Date: 01/11/2019 CLINICAL DATA:  Stable enlarged cardiac silhouette. EXAM: PORTABLE CHEST 1 VIEW COMPARISON:  12/29/2018 FINDINGS: Cardiac silhouette is enlarged compared to prior. No effusion, infiltrate or pneumothorax. No acute osseous abnormality. IMPRESSION: Enlarged cardiac silhouette representing  either new cardiomegaly or pericardial effusion. Electronically Signed   By: Suzy Bouchard M.D.   On: 01/11/2019 16:57    Scheduled Meds: . HYDROcodone-acetaminophen  1 tablet Oral QHS  . insulin aspart  0-5 Units Subcutaneous QHS  . insulin aspart  0-9 Units Subcutaneous TID WC  . latanoprost  1 drop Both Eyes QHS  . Melatonin  9-15 mg Oral QHS  . multivitamins with iron  1 tablet Oral Daily  . rivaroxaban  20 mg Oral Q supper  . senna  1 tablet Oral QHS  . sodium chloride flush  3 mL Intravenous Q12H  . ascorbic acid  1,000 mg Oral BID   Continuous Infusions: .  sodium chloride      Principal Problem:   Acute on chronic diastolic CHF (congestive heart failure) (HCC) Active Problems:   Hyponatremia   Bradycardia   Pericardial effusion   Acute kidney injury ([place])   Type 2 diabetes mellitus with vascular disease ([place])   Essential hypertension   CAD S/P percutaneous coronary angioplasty   Permanent atrial fibrillation   COPD mixed type ([place])    Time spent: 46 minutes    Whit[place] NP  [facility]  If 7PM-7AM, please contact night-coverage at [email or web address], password [facility] 01/12/2019, 9:52 AM  LOS: 0 days

## 2019-01-12 NOTE — Progress Notes (Signed)
Patient with several diarrhea stools today. Also reports BRBPR. Hx gi bleed with anemia. No recent antibiotics. Hg 8.7. home meds include celebrex, xarelto. Has had endo and colonoscopy 01/2018. She reports Buccemi opined she had "seeping of blood from small vessels in lower GI tract.  Has been on lifelong PPI. Chart review indicates egd and colonoscopy reveal No source of anemia or heme positive stool endoscopically evident. - Erythematous mucosa in the proximal ascending colon. Previously-noted AVM in proximal colon not visualized on today's exam. - The examined portion of the ileum was normal. - The distal rectum and anal verge are normal on retroflexion view. - No specimens collected. Impression: - Repeat colonoscopy is not recommended for screening purposes in view of patient's age, co-morbidities, and the favorable findings on the current exam. - Follow-up in office as planned, with consideration for capsule endoscopy, especiallly if she shows evidence of recurrent heme positivity and/or anemia, despite more aggressive PPI therapy in view of the pyloric erosions noted on today's egd while on Prilosec 20 mg daily.  A/P.  1.Diarrhea/ BRBPR. Hx of same.  -hold xarelto -hold celebrex -obtain fobt -PPI -serial cbc -clear liquid -cdiff -gi pathogen panel -consider gi consult if no improvement  2. Anemia related to chronic blood loss. Hg 8.7 on admission. Hx IDA per chart. Required transfusion last admission -serial cbc -see #1 -gi consult if no improvement with possible capsule study.  -transfuse if hg less than or equal to 7.9  Dyanne Carrel, NP

## 2019-01-12 NOTE — Progress Notes (Signed)
Dr. Radford Pax and Dr. Haroldine Laws saw patient this evening and requested patient to be transferred to a step down telemetry unit.  Awaiting orders for transfer.  Paged Cardiology on call.

## 2019-01-12 NOTE — Progress Notes (Signed)
CRITICAL VALUE ALERT  Critical Value: Na-117  Date & Time Notied: 01/12/19  Provider Notified: Bodenheimer  Orders Received/Actions taken: None

## 2019-01-12 NOTE — Progress Notes (Signed)
Patients heart rate is ranging from 40s to 50s,atrial fibrillation with episode of dropping below 40.B/P is 95/57 asymptomatic,just complaining of weakness(baseline).Hospitalist on call Bodenheimer made aware,no orders made,will continue to monitor.

## 2019-01-12 NOTE — Progress Notes (Signed)
Patient blood pressure 78/50 manual this morning. Patient states she feels nauseous, no vomiting, however also having diarrhea. Dyanne Carrel, NP and MD made aware. New orders received. Will continue to monitor.

## 2019-01-12 NOTE — Significant Event (Signed)
Rapid Response Event Note  Overview:Called d/t concern for decreased BP, cool extremities, and lethargy. SBP-70-80s t/o day on 3E, albumin given. Pt transferred to 6E02 earlier for closer monitoring.  Time Called: 2215 Arrival Time: 2217 Event Type: Cardiac  Initial Focused Assessment: Pt laying in bed with hand over forehead, denies pain but says she is tired. VS: HR-90, BP-84/58, RR-18, SpO2-91% on RA. Lungs clear t/o, diminished bases. Heart sounds distant. Extremities cool, fingertips purple. According to pt, both of these have improved from earlier.  After talking to pt, BP-120/88. Interventions: Cardiology notified and will review chart-may give pt more albumin. Plan of Care (if not transferred): Await Cardiology orders, call RRT if further assistance Event Summary: Name of Physician Notified: [place] at (PTA RRT)    at    Outcome: Stayed in room and stabalized  Event End Time: 2224  Dillard Essex

## 2019-01-13 ENCOUNTER — Encounter (HOSPITAL_COMMUNITY): Payer: Self-pay | Admitting: Internal Medicine

## 2019-01-13 ENCOUNTER — Encounter (HOSPITAL_COMMUNITY)
Admission: EM | Disposition: A | Discharge status: Home Health | Payer: Self-pay | Source: Home / Self Care | Attending: Family Medicine

## 2019-01-13 DIAGNOSIS — R570 Cardiogenic shock: Secondary | ICD-10-CM

## 2019-01-13 DIAGNOSIS — I313 Pericardial effusion (noninflammatory): Secondary | ICD-10-CM

## 2019-01-13 DIAGNOSIS — I314 Cardiac tamponade: Secondary | ICD-10-CM

## 2019-01-13 DIAGNOSIS — K922 Gastrointestinal hemorrhage, unspecified: Secondary | ICD-10-CM | POA: Diagnosis not present

## 2019-01-13 DIAGNOSIS — N179 Acute kidney failure, unspecified: Secondary | ICD-10-CM | POA: Diagnosis not present

## 2019-01-13 DIAGNOSIS — K625 Hemorrhage of anus and rectum: Secondary | ICD-10-CM | POA: Diagnosis not present

## 2019-01-13 HISTORY — PX: PERICARDIOCENTESIS: CATH118255

## 2019-01-13 HISTORY — PX: RIGHT AND LEFT HEART CATH: CATH118262

## 2019-01-13 HISTORY — PX: CENTRAL LINE INSERTION: CATH118232

## 2019-01-13 LAB — POCT I-STAT EG7
Acid-base deficit: 7 mmol/L — ABNORMAL HIGH (ref 0.0–2.0)
Acid-base deficit: 7 mmol/L — ABNORMAL HIGH (ref 0.0–2.0)
Acid-base deficit: 8 mmol/L — ABNORMAL HIGH (ref 0.0–2.0)
Bicarbonate: 18.2 mmol/L — ABNORMAL LOW (ref 20.0–28.0)
Bicarbonate: 19.2 mmol/L — ABNORMAL LOW (ref 20.0–28.0)
Bicarbonate: 17 mmol/L — ABNORMAL LOW (ref 20.0–28.0)
Calcium, Ion: 1.04 mmol/L — ABNORMAL LOW (ref 1.15–1.40)
Calcium, Ion: 1.12 mmol/L — ABNORMAL LOW (ref 1.15–1.40)
Calcium, Ion: 0.9 mmol/L — ABNORMAL LOW (ref 1.15–1.40)
HCT: 29 % — ABNORMAL LOW (ref 36.0–46.0)
HCT: 31 % — ABNORMAL LOW (ref 36.0–46.0)
HCT: 24 % — ABNORMAL LOW (ref 36.0–46.0)
Hemoglobin: 10.5 g/dL — ABNORMAL LOW (ref 12.0–15.0)
Hemoglobin: 9.9 g/dL — ABNORMAL LOW (ref 12.0–15.0)
Hemoglobin: 8.2 g/dL — ABNORMAL LOW (ref 12.0–15.0)
O2 Saturation: 56 %
O2 Saturation: 62 %
O2 Saturation: 75 %
Potassium: 5.6 mmol/L — ABNORMAL HIGH (ref 3.5–5.1)
Potassium: 5.9 mmol/L — ABNORMAL HIGH (ref 3.5–5.1)
Potassium: 4.8 mmol/L (ref 3.5–5.1)
Sodium: 113 mmol/L — CL (ref 135–145)
Sodium: 116 mmol/L — CL (ref 135–145)
Sodium: 121 mmol/L — ABNORMAL LOW (ref 135–145)
TCO2: 19 mmol/L — ABNORMAL LOW (ref 22–32)
TCO2: 20 mmol/L — ABNORMAL LOW (ref 22–32)
TCO2: 18 mmol/L — ABNORMAL LOW (ref 22–32)
pCO2, Ven: 36.4 mmHg — ABNORMAL LOW (ref 44.0–60.0)
pCO2, Ven: 39.8 mmHg — ABNORMAL LOW (ref 44.0–60.0)
pCO2, Ven: 32.9 mmHg — ABNORMAL LOW (ref 44.0–60.0)
pH, Ven: 7.292 (ref 7.250–7.430)
pH, Ven: 7.308 (ref 7.250–7.430)
pH, Ven: 7.322 (ref 7.250–7.430)
pO2, Ven: 32 mmHg (ref 32.0–45.0)
pO2, Ven: 35 mmHg (ref 32.0–45.0)
pO2, Ven: 43 mmHg (ref 32.0–45.0)

## 2019-01-13 LAB — BASIC METABOLIC PANEL WITH GFR
Anion gap: 17 — ABNORMAL HIGH (ref 5–15)
BUN: 62 mg/dL — ABNORMAL HIGH (ref 8–23)
CO2: 21 mmol/L — ABNORMAL LOW (ref 22–32)
Calcium: 8.7 mg/dL — ABNORMAL LOW (ref 8.9–10.3)
Chloride: 78 mmol/L — ABNORMAL LOW (ref 98–111)
Creatinine, Ser: 3.48 mg/dL — ABNORMAL HIGH (ref 0.44–1.00)
GFR calc Af Amer: 15 mL/min — ABNORMAL LOW
GFR calc non Af Amer: 13 mL/min — ABNORMAL LOW
Glucose, Bld: 137 mg/dL — ABNORMAL HIGH (ref 70–99)
Potassium: 4.6 mmol/L (ref 3.5–5.1)
Sodium: 116 mmol/L — CL (ref 135–145)

## 2019-01-13 LAB — BODY FLUID CELL COUNT WITH DIFFERENTIAL
Eos, Fluid: 1 %
Lymphs, Fluid: 7 %
Monocyte-Macrophage-Serous Fluid: 7 % — ABNORMAL LOW (ref 50–90)
Neutrophil Count, Fluid: 85 % — ABNORMAL HIGH (ref 0–25)
Total Nucleated Cell Count, Fluid: 6556 cu mm — ABNORMAL HIGH (ref 0–1000)

## 2019-01-13 LAB — GASTROINTESTINAL PANEL BY PCR, STOOL (REPLACES STOOL CULTURE)

## 2019-01-13 LAB — CBC
HCT: 26.8 % — ABNORMAL LOW (ref 36.0–46.0)
Hemoglobin: 8.9 g/dL — ABNORMAL LOW (ref 12.0–15.0)
MCH: 24.3 pg — ABNORMAL LOW (ref 26.0–34.0)
MCHC: 33.2 g/dL (ref 30.0–36.0)
MCV: 73.2 fL — ABNORMAL LOW (ref 80.0–100.0)
Platelets: 493 10*3/uL — ABNORMAL HIGH (ref 150–400)
RBC: 3.66 MIL/uL — ABNORMAL LOW (ref 3.87–5.11)
RDW: 19.6 % — ABNORMAL HIGH (ref 11.5–15.5)
WBC: 17.7 10*3/uL — ABNORMAL HIGH (ref 4.0–10.5)
nRBC: 0 % (ref 0.0–0.2)

## 2019-01-13 LAB — BASIC METABOLIC PANEL
Anion gap: 18 — ABNORMAL HIGH (ref 5–15)
Anion gap: 16 — ABNORMAL HIGH (ref 5–15)
BUN: 57 mg/dL — ABNORMAL HIGH (ref 8–23)
BUN: 62 mg/dL — ABNORMAL HIGH (ref 8–23)
CO2: 19 mmol/L — ABNORMAL LOW (ref 22–32)
CO2: 21 mmol/L — ABNORMAL LOW (ref 22–32)
Calcium: 9.1 mg/dL (ref 8.9–10.3)
Calcium: 8.7 mg/dL — ABNORMAL LOW (ref 8.9–10.3)
Chloride: 77 mmol/L — ABNORMAL LOW (ref 98–111)
Chloride: 78 mmol/L — ABNORMAL LOW (ref 98–111)
Creatinine, Ser: 3.57 mg/dL — ABNORMAL HIGH (ref 0.44–1.00)
Creatinine, Ser: 3.74 mg/dL — ABNORMAL HIGH (ref 0.44–1.00)
GFR calc Af Amer: 14 mL/min — ABNORMAL LOW (ref >60)
GFR calc Af Amer: 13 mL/min — ABNORMAL LOW (ref >60)
GFR calc non Af Amer: 12 mL/min — ABNORMAL LOW (ref >60)
GFR calc non Af Amer: 12 mL/min — ABNORMAL LOW (ref >60)
Glucose, Bld: 177 mg/dL — ABNORMAL HIGH (ref 70–99)
Glucose, Bld: 135 mg/dL — ABNORMAL HIGH (ref 70–99)
Potassium: 5.9 mmol/L — ABNORMAL HIGH (ref 3.5–5.1)
Potassium: 5.4 mmol/L — ABNORMAL HIGH (ref 3.5–5.1)
Sodium: 114 mmol/L — CL (ref 135–145)
Sodium: 115 mmol/L — CL (ref 135–145)

## 2019-01-13 LAB — URINALYSIS, ROUTINE W REFLEX MICROSCOPIC
Bilirubin Urine: NEGATIVE
Glucose, UA: NEGATIVE mg/dL
Ketones, ur: NEGATIVE mg/dL
Nitrite: NEGATIVE
Protein, ur: 100 mg/dL — AB
Specific Gravity, Urine: 1.012 (ref 1.005–1.030)
WBC, UA: >50 WBC/hpf — ABNORMAL HIGH (ref 0–5)
pH: 6 (ref 5.0–8.0)

## 2019-01-13 LAB — POCT I-STAT 7, (LYTES, BLD GAS, ICA,H+H)
Acid-base deficit: 7 mmol/L — ABNORMAL HIGH (ref 0.0–2.0)
Bicarbonate: 16.9 mmol/L — ABNORMAL LOW (ref 20.0–28.0)
Calcium, Ion: 1.01 mmol/L — ABNORMAL LOW (ref 1.15–1.40)
HCT: 28 % — ABNORMAL LOW (ref 36.0–46.0)
Hemoglobin: 9.5 g/dL — ABNORMAL LOW (ref 12.0–15.0)
O2 Saturation: 100 %
Potassium: 5.5 mmol/L — ABNORMAL HIGH (ref 3.5–5.1)
Sodium: 116 mmol/L — CL (ref 135–145)
TCO2: 18 mmol/L — ABNORMAL LOW (ref 22–32)
pCO2 arterial: 29.5 mmHg — ABNORMAL LOW (ref 32.0–48.0)
pH, Arterial: 7.366 (ref 7.350–7.450)
pO2, Arterial: 187 mmHg — ABNORMAL HIGH (ref 83.0–108.0)

## 2019-01-13 LAB — GLUCOSE, CAPILLARY
Glucose-Capillary: 155 mg/dL — ABNORMAL HIGH (ref 70–99)
Glucose-Capillary: 118 mg/dL — ABNORMAL HIGH (ref 70–99)
Glucose-Capillary: 127 mg/dL — ABNORMAL HIGH (ref 70–99)
Glucose-Capillary: 113 mg/dL — ABNORMAL HIGH (ref 70–99)

## 2019-01-13 LAB — MRSA PCR SCREENING: MRSA by PCR: NEGATIVE

## 2019-01-13 SURGERY — PERICARDIOCENTESIS
Anesthesia: LOCAL

## 2019-01-13 MED ORDER — HEPARIN (PORCINE) IN NACL 1000-0.9 UT/500ML-% IV SOLN
INTRAVENOUS | Status: DC | PRN
  Administered 2019-01-13 (×2): 500 mL

## 2019-01-13 MED ORDER — CHLORHEXIDINE GLUCONATE CLOTH 2 % EX PADS
6.0000 | MEDICATED_PAD | Freq: Every day | CUTANEOUS | Status: DC
  Administered 2019-01-13 – 2019-01-16 (×4): 6 via TOPICAL

## 2019-01-13 MED ORDER — PATIROMER SORBITEX CALCIUM 8.4 G PO PACK
8.4000 g | PACK | Freq: Once | ORAL | Status: AC
  Administered 2019-01-13: 8.4 g via ORAL
  Filled 2019-01-13: qty 1

## 2019-01-13 MED ORDER — SODIUM CHLORIDE 0.9 % IV SOLN: 250.0000 mL | INTRAVENOUS | Status: DC | PRN

## 2019-01-13 MED ORDER — SODIUM CHLORIDE 0.9% FLUSH: 3.0000 mL | INTRAVENOUS | Status: DC | PRN

## 2019-01-13 MED ORDER — VERAPAMIL HCL 2.5 MG/ML IV SOLN
INTRAVENOUS | Status: DC | PRN
  Administered 2019-01-13: 10:00:00 via INTRA_ARTERIAL

## 2019-01-13 MED ORDER — TOLVAPTAN 15 MG PO TABS
30.0000 mg | ORAL_TABLET | Freq: Once | ORAL | Status: AC
  Administered 2019-01-13: 30 mg via ORAL
  Filled 2019-01-13: qty 2

## 2019-01-13 MED ORDER — LIDOCAINE HCL (PF) 1 % IJ SOLN
INTRAMUSCULAR | Status: AC
  Filled 2019-01-13: qty 30

## 2019-01-13 MED ORDER — LORAZEPAM 0.5 MG PO TABS
0.5000 mg | ORAL_TABLET | Freq: Four times a day (QID) | ORAL | Status: DC | PRN
  Administered 2019-01-13 – 2019-01-15 (×2): 0.5 mg via ORAL
  Filled 2019-01-13 (×2): qty 1

## 2019-01-13 MED ORDER — ASPIRIN 81 MG PO CHEW
CHEWABLE_TABLET | ORAL | Status: AC
  Filled 2019-01-13: qty 1

## 2019-01-13 MED ORDER — FUROSEMIDE 10 MG/ML IJ SOLN
160.0000 mg | Freq: Once | INTRAVENOUS | Status: AC
  Administered 2019-01-13: 160 mg via INTRAVENOUS
  Filled 2019-01-13: qty 16

## 2019-01-13 MED ORDER — ROPINIROLE HCL 1 MG PO TABS: 1.0000 mg | ORAL_TABLET | Freq: Every day | ORAL | Status: DC | PRN

## 2019-01-13 MED ORDER — ROPINIROLE HCL ER 4 MG PO TB24
4.0000 mg | ORAL_TABLET | Freq: Every day | ORAL | Status: DC
  Filled 2019-01-13 (×2): qty 1

## 2019-01-13 MED ORDER — TOLVAPTAN 15 MG PO TABS
15.0000 mg | ORAL_TABLET | Freq: Once | ORAL | Status: AC
  Administered 2019-01-13: 15 mg via ORAL
  Filled 2019-01-13: qty 1

## 2019-01-13 MED ORDER — GABAPENTIN 600 MG PO TABS
300.0000 mg | ORAL_TABLET | Freq: Every day | ORAL | Status: DC
  Administered 2019-01-13 – 2019-01-15 (×3): 300 mg via ORAL
  Filled 2019-01-13 (×3): qty 1

## 2019-01-13 MED ORDER — ASPIRIN 81 MG PO CHEW
81.0000 mg | CHEWABLE_TABLET | ORAL | Status: AC
  Administered 2019-01-13: 81 mg via ORAL
  Filled 2019-01-13: qty 1

## 2019-01-13 MED ORDER — ROPINIROLE HCL 1 MG PO TABS
2.0000 mg | ORAL_TABLET | Freq: Every day | ORAL | Status: DC
  Administered 2019-01-13 – 2019-01-15 (×3): 2 mg via ORAL
  Filled 2019-01-13 (×3): qty 2

## 2019-01-13 MED ORDER — LIDOCAINE HCL (PF) 1 % IJ SOLN
INTRAMUSCULAR | Status: DC | PRN
  Administered 2019-01-13: 2 mL via SUBCUTANEOUS
  Administered 2019-01-13: 5 mL via SUBCUTANEOUS
  Administered 2019-01-13: 2 mL via SUBCUTANEOUS
  Administered 2019-01-13: 10 mL via SUBCUTANEOUS

## 2019-01-13 MED ORDER — SODIUM CHLORIDE 0.9 % IV SOLN
INTRAVENOUS | Status: DC
  Administered 2019-01-13: 09:00:00 via INTRAVENOUS

## 2019-01-13 MED ORDER — SODIUM CHLORIDE 0.9% FLUSH: 3.0000 mL | Freq: Two times a day (BID) | INTRAVENOUS | Status: DC

## 2019-01-13 MED ORDER — VERAPAMIL HCL 2.5 MG/ML IV SOLN
INTRAVENOUS | Status: AC
  Filled 2019-01-13: qty 2

## 2019-01-13 MED ORDER — HEPARIN (PORCINE) IN NACL 1000-0.9 UT/500ML-% IV SOLN
INTRAVENOUS | Status: AC
  Filled 2019-01-13: qty 1000

## 2019-01-13 MED ORDER — SODIUM CHLORIDE 0.9 % IV SOLN
1.0000 g | INTRAVENOUS | Status: DC
  Administered 2019-01-14 – 2019-01-15 (×3): 1 g via INTRAVENOUS
  Filled 2019-01-13 (×5): qty 10

## 2019-01-13 SURGICAL SUPPLY — 14 items
CATH BALLN WEDGE 5F 110CM (CATHETERS) ×1 IMPLANT
CATH INFINITI JR4 5F (CATHETERS) ×1 IMPLANT
DEVICE RAD COMP TR BAND LRG (VASCULAR PRODUCTS) ×1 IMPLANT
GLIDESHEATH SLEND SS 6F .021 (SHEATH) ×1 IMPLANT
GUIDEWIRE INQWIRE 1.5J.035X260 (WIRE) IMPLANT
INQWIRE 1.5J .035X260CM (WIRE) ×3
KIT HEART LEFT (KITS) ×3 IMPLANT
PACK CARDIAC CATHETERIZATION (CUSTOM PROCEDURE TRAY) ×3 IMPLANT
PERIVAC PERICARDIOCENTESIS 8.3 (TRAY / TRAY PROCEDURE) ×1 IMPLANT
SHEATH GLIDE SLENDER 4/5FR (SHEATH) ×1 IMPLANT
SYR MEDRAD MARK 7 150ML (SYRINGE) ×3 IMPLANT
TRANSDUCER W/STOPCOCK (MISCELLANEOUS) ×3 IMPLANT
TRAY CATH 3LUMEN 20C SULFAFREE (CATHETERS) ×1 IMPLANT
TUBING CIL FLEX 10 FLL-RA (TUBING) ×3 IMPLANT

## 2019-01-13 NOTE — Consult Note (Addendum)
Cardiology Consultation:   Patient ID: [name] MRN: [id number]; DOB: 09/27/1950  Admit date: 01/11/2019 Date of Consult: 01/13/2019  Primary Care Provider: Hoyt Koch, MD Primary Cardiologist: Quay Burow, MD  Primary Electrophysiologist:  None    Patient Profile:   [name] is a 68 y.o. female with a hx of chronic diastolic congestive heart failure,  Coronary artery disease, Diabetes mellitus,  Hypertension who is being seen today for the evaluation of acute exacerbation of her congestive heart failure at the request of  Dr. Wyonia Hough.  History of Present Illness:   [name] has a history of chronic diastolic congestive heart failure.  She was last seen by Dr. Gwenlyn Morrison last week.   She was volume overloaded at that time.  Her torsemide was increased from 40 mg once a day to twice a day.  She was to follow-up in the office in several weeks. She admits to not being compliant with her salt intake   Her leg edema improved for several days as did her breathing but then she stopped improving  Creatinine has increased from 1.44 up to 2.22 overnight   Echo has been done    Still eats some processed meats and canned veggies, / soup  BP has been low    Echo yesterday revealed a large pericardial effusion. She has pulmonary HTN - there is no collapse of the RA or RV but there was some concern for early tamponade by Dr. Haroldine Laws .  She is scheduled for a RHC with possible pericardiocentesis today      Past Medical History:  Diagnosis Date   Anemia, unspecified    Arthritis    Asthma    Atrial fibrillation with RVR ([place])    a. on Xarelto   Bell's palsy    Facial nerve decompression in 2001   CHF (congestive heart failure) ([place])    Chronic low back pain    COPD with asthma ([place])    Coronary artery disease    Myoview 04/12/11 was entirely normal. ECHO 02/26/08 showed only minor abnormalities. Stenting 05/26/08 of her posterolateral branch to the  left circumflex coronary artery. Used a 2.5x16mm Taxus Monorail stent.myoview 2014 was without ischemia   Diabetes mellitus    Type 2   Early cataracts, bilateral    Fatty liver    GERD (gastroesophageal reflux disease)    Glaucoma    Glaucoma    Goiter    Heart murmur    History of nuclear stress test 2012; 2014   lexiscan; normal pattern of perfusion; normal, low risk scan    Hyperlipidemia    Hypertension    Panic disorder    Pneumonia 2008   Polycystic ovary disease    Hysterectomy in 1982 for this   Shortness of breath dyspnea    ECHO 02/26/08 showed only minor abnormalities   Spinal stenosis     Past Surgical History:  Procedure Laterality Date   ABDOMINAL HYSTERECTOMY  1982   & BSO; for polycystic ovary disease   CARDIOVERSION N/A 12/17/2013   Procedure: CARDIOVERSION;  Surgeon: Pixie Casino, MD;  Location: [facility];  Service: Cardiovascular;  Laterality: N/A;   CARDIOVERSION N/A 09/30/2015   Procedure: CARDIOVERSION;  Surgeon: Dorothy Spark, MD;  Location: [place];  Service: Cardiovascular;  Laterality: N/A;   CARDIOVERSION N/A 06/22/2016   Procedure: CARDIOVERSION;  Surgeon: Skeet Latch, MD;  Location: [facility] ENDOSCOPY;  Service: Cardiovascular;  Laterality: N/A;   COLONOSCOPY     last  2009; Dr Cristina Gong; due 2019   COLONOSCOPY WITH PROPOFOL N/A 01/23/2018   Procedure: COLONOSCOPY WITH PROPOFOL;  Surgeon: Ronald Lobo, MD;  Location: [place];  Service: Endoscopy;  Laterality: N/A;   CORONARY ANGIOPLASTY  05/26/2008   Stenting of her posterolateral branch to the left circumflex coronary artery. Used a 2.5x81mm Taxus Monorail stent.   ESOPHAGOGASTRODUODENOSCOPY (EGD) WITH PROPOFOL N/A 01/23/2018   Procedure: ESOPHAGOGASTRODUODENOSCOPY (EGD) WITH PROPOFOL;  Surgeon: Ronald Lobo, MD;  Location: [place];  Service: Endoscopy;  Laterality: N/A;   FACIAL NERVE DECOMPRESSION  2001/2002   bells palsy    LAPAROSCOPIC  CHOLECYSTECTOMY  06/15/2011    Dr Dalbert Batman   TEE WITHOUT CARDIOVERSION N/A 12/17/2013   Procedure: TRANSESOPHAGEAL ECHOCARDIOGRAM (TEE);  Surgeon: Pixie Casino, MD;  Location: [facility] ENDOSCOPY;  Service: Cardiovascular;  Laterality: N/A;  trish/ja   TRANSTHORACIC ECHOCARDIOGRAM  9/38/1017   LV systolic function normal with mild conc LVH; LA mildly dilated; trace MR/TR   UPPER GI ENDOSCOPY  2009   negative     Home Medications:  Prior to Admission medications   Medication Sig Start Date End Date Taking? Authorizing Provider  ascorbic acid (VITAMIN C) 1000 MG tablet Take 1,000 mg by mouth 2 (two) times daily.    Yes [provider]  CALCIUM-MAG-VIT C-VIT D PO Take 1 tablet by mouth daily.    Yes [provider]  celecoxib (CELEBREX) 200 MG capsule Take 1 capsule (200 mg total) by mouth 2 (two) times daily. 01/02/19  Yes Hoyt Koch, MD  diltiazem (CARDIZEM CD) 180 MG 24 hr capsule TAKE 1 CAPSULE DAILY. Patient taking differently: Take 180 mg by mouth daily.  12/15/18  Yes Lorretta Harp, MD  gabapentin (NEURONTIN) 100 MG capsule TAKE (1) CAPSULE THREE TIMES DAILY. Patient taking differently: Take 100 mg by mouth 3 (three) times daily.  01/06/19  Yes Hoyt Koch, MD  guaiFENesin (MUCINEX) 600 MG 12 hr tablet Take 600 mg by mouth 2 (two) times daily as needed for cough.    Yes [provider]  HYDROcodone-acetaminophen (NORCO/VICODIN) 5-325 MG tablet Take 1 tablet by mouth at bedtime. 01/02/19  Yes Hoyt Koch, MD  INVOKANA 100 MG TABS tablet TAKE 1 TABLET ONCE DAILY. Patient taking differently: Take 100 mg by mouth daily before breakfast.  09/22/18  Yes Hoyt Koch, MD  isosorbide mononitrate (IMDUR) 30 MG 24 hr tablet Take 0.5 tablets (15 mg total) by mouth daily. 01/06/19  Yes Lorretta Harp, MD  latanoprost (XALATAN) 0.005 % ophthalmic solution Place 1 drop into both eyes at bedtime. 12/09/13  Yes [provider]    LORazepam (ATIVAN) 0.5 MG tablet TAKE 1 OR 2 TABLETS AT BEDTIME. Patient taking differently: Take 0.5-1 mg by mouth at bedtime.  10/14/18  Yes Hoyt Koch, MD  Melatonin 5 MG TABS Take 2-3 tablets by mouth at bedtime.    Yes [provider]  metoprolol succinate (TOPROL-XL) 25 MG 24 hr tablet TAKE ONE TABLET AT BEDTIME. Patient taking differently: Take 25 mg by mouth as needed. Take as needed for palpitations 12/02/18  Yes Sherran Needs, NP  metoprolol succinate (TOPROL-XL) 50 MG 24 hr tablet Take 100 mg by mouth daily.  12/14/18  Yes [provider]  Multiple Vitamins-Iron (MULTIVITAMINS WITH IRON) TABS Take 1 tablet by mouth daily.    Yes [provider]  nystatin ointment (MYCOSTATIN) Apply 1 application topically 2 (two) times daily as needed (IRRITATION).  07/09/16  Yes [provider]  omeprazole (PRILOSEC) 20 MG capsule Take 1 capsule (20 mg total) by mouth 2 (two) times daily before a meal. 12/31/18  Yes Vann, Jessica U, DO  PARoxetine (PAXIL) 10 MG tablet TAKE 2 AND 1/2 TABS DAILY Patient taking differently: Take 25 mg by mouth daily.  10/13/18  Yes Hoyt Koch, MD  quinapril (ACCUPRIL) 40 MG tablet TAKE 1 TABLET ONCE DAILY. Patient taking differently: Take 40 mg by mouth at bedtime.  09/18/18  Yes Hoyt Koch, MD  rOPINIRole (REQUIP XL) 2 MG 24 hr tablet TAKE ONE TABLET AT BEDTIME. Patient taking differently: Take 2 mg by mouth at bedtime.  07/24/18  Yes Dohmeier, Asencion Partridge, MD  rOPINIRole (REQUIP) 0.5 MG tablet Take 1 to 2 tab at midnight. ( XL form at 6 PM) Patient taking differently: Take 0.5-1 mg by mouth daily as needed (restless legs).  06/30/18  Yes Dohmeier, Asencion Partridge, MD  rosuvastatin (CRESTOR) 10 MG tablet TAKE ONE TABLET AT BEDTIME. Patient taking differently: Take 10 mg by mouth daily.  10/21/18  Yes Lorretta Harp, MD  senna (SENOKOT) 8.6 MG TABS Take 1 tablet by mouth at bedtime.    Yes [provider]   sitaGLIPtin-metformin (JANUMET) 50-1000 MG tablet TAKE 1/2 TABLET IN THE MORNING WITH BREAKFAST AND 1 TABLET IN THE EVENING WITH SUPPER. Patient taking differently: Take 0.5-1 tablets by mouth See admin instructions. TAKE 1/2 TABLET IN THE MORNING WITH BREAKFAST AND 1 TABLET IN THE EVENING WITH SUPPER. 11/17/18  Yes Hoyt Koch, MD  Tiotropium Bromide-Olodaterol (STIOLTO RESPIMAT) 2.5-2.5 MCG/ACT AERS Inhale 2 puffs into the lungs daily. 12/01/18  Yes Young, Tarri Fuller D, MD  torsemide (DEMADEX) 20 MG tablet Take 2 tablets (40 mg total) by mouth 2 (two) times daily. 01/06/19  Yes Lorretta Harp, MD  XARELTO 20 MG TABS tablet TAKE 1 TABLET ONCE DAILY WITH SUPPER. Patient taking differently: Take 20 mg by mouth daily with supper.  11/25/18  Yes Lorretta Harp, MD  Continuous Blood Gluc Sensor ([place]) MISC Use to monitor sugars 04/23/17   Hoyt Koch, MD  [facility] DELICA LANCETS FINE MISC 1 Units by Does not apply route as directed. 10/05/11   Midge Minium, MD    Inpatient Medications: Scheduled Meds:  aspirin  81 mg Oral Pre-Cath   HYDROcodone-acetaminophen  1 tablet Oral QHS   insulin aspart  0-5 Units Subcutaneous QHS   insulin aspart  0-9 Units Subcutaneous TID WC   latanoprost  1 drop Both Eyes QHS   Melatonin  9-15 mg Oral QHS   multivitamins with iron  1 tablet Oral Daily   pantoprazole (PROTONIX) IV  40 mg Intravenous Q12H   senna  1 tablet Oral QHS   sodium chloride flush  3 mL Intravenous Q12H   sodium chloride flush  3 mL Intravenous Q12H   ascorbic acid  1,000 mg Oral BID   Continuous Infusions:  sodium chloride     sodium chloride     sodium chloride     PRN Meds: sodium chloride, sodium chloride, acetaminophen, guaiFENesin, ondansetron (ZOFRAN) IV, sodium chloride flush, sodium chloride flush  Allergies:    Allergies  Allergen Reactions   Benadryl [Diphenhydramine Hcl] Other (See Comments)    Restless leg    Clopidogrel Bisulfate Nausea Only    Nausea & pain   Contrast Media [Iodinated Diagnostic Agents] Palpitations    Rapid heart rate, hot    Social History:   Social History  Socioeconomic History   Marital status: Married    Spouse name: Not on file   Number of children: 2   Years of education: master's   Highest education level: Not on file  Occupational History   Occupation: Electrical engineer    Employer: [employer] resource strain: Not on file   Food insecurity:    Worry: Not on file    Inability: Not on file   Transportation needs:    Medical: Not on file    Non-medical: Not on file  Tobacco Use   Smoking status: Former Smoker    Packs/day: 1.50    Years: 30.00    Pack years: 45.00    Types: Cigarettes    Last attempt to quit: 08/13/1993    Years since quitting: 25.4   Smokeless tobacco: Never Used  Substance and Sexual Activity   Alcohol use: No   Drug use: No   Sexual activity: Not on file  Lifestyle   Physical activity:    Days per week: Not on file    Minutes per session: Not on file   Stress: Not on file  Relationships   Social connections:    Talks on phone: Not on file    Gets together: Not on file    Attends religious service: Not on file    Active member of club or organization: Not on file    Attends meetings of clubs or organizations: Not on file    Relationship status: Not on file   Intimate partner violence:    Fear of current or ex partner: Not on file    Emotionally abused: Not on file    Physically abused: Not on file    Forced sexual activity: Not on file  Other Topics Concern   Not on file  Social History Narrative   Not on file    Family History:    Family History  Problem Relation Age of Onset   Heart attack Father 41       2nd MI at 42   Colon cancer Brother 33   Gout Brother    Ulcers Mother    Emphysema Mother 81   Colon polyps Sister    Cancer Sister         Basal cell carcinoma   Pneumonia Maternal Grandmother    Hypertension Brother    Hyperlipidemia Brother    Cancer Brother        Skin   Diabetes Neg Hx    Stroke Neg Hx      ROS:  Please see the history of present illness.   All other ROS reviewed and negative.     Physical Exam/Data:   Vitals:   01/12/19 2133 01/13/19 0621 01/13/19 0749 01/13/19 0803  BP: (!) 95/40 (!) 131/50 (!) 85/54 103/63  Pulse:  92 (!) 102   Resp:  18    Temp:  97.9 F (36.6 C)    TempSrc:  Oral    SpO2:      Weight:  92.9 kg    Height:        Intake/Output Summary (Last 24 hours) at 01/13/2019 0821 Last data filed at 01/13/2019 0401 Gross per 24 hour  Intake 948.22 ml  Output 200 ml  Net 748.22 ml   Last 3 Weights 01/13/2019 01/12/2019 01/11/2019  Weight (lbs) 204 lb 12.8 oz 203 lb 3.2 oz 202 lb 1.6 oz  Weight (kg) 92.897 kg 92.171 kg 91.672 kg  Body mass index is 37.46 kg/m.  General:  Elderly female,  Very weak this am .  Does not feel well  HEENT: normla  Lymph: no adenopathy Neck: thick neck, difficult to assess JVD  Endocrine:  No thryomegaly Vascular: no carotid bruits.  Cardiac:   Distant heart sounds   ,  Irreg.  Lungs:  Clear  Abd: moderately obese  Ext: 1-2 + edema  Musculoskeletal:  No deformities, BUE and BLE strength normal and equal Skin: extremities are cool.  Neuro:  CN 2-12 intact.   Psych:  Normal affect   EKG:     Telemetry:   A-Fib with controlled V response   Relevant CV Studies:    Laboratory Data:  Chemistry Recent Labs  Lab 01/11/19 1608 01/12/19 0254 01/13/19 0457  NA 120* 117* 114*  K 4.5 5.6* 5.9*  CL 80* 82* 77*  CO2 21* 20* 19*  GLUCOSE 217* 207* 177*  BUN 33* 39* 57*  CREATININE 1.44* 2.22* 3.57*  CALCIUM 9.4 8.9 9.1  GFRNONAA 37* 22* 12*  GFRAA 43* 25* 14*  ANIONGAP 19* 15 18*    Recent Labs  Lab 01/06/19 1342 01/11/19 1608  PROT 6.9 6.8  ALBUMIN 3.7 3.1*  AST 16 22  ALT 11 16  ALKPHOS 69 76  BILITOT 0.5 0.7    Hematology Recent Labs  Lab 01/12/19 1458 01/12/19 2029 01/13/19 0457  WBC 16.7* 14.5* 17.7*  RBC 3.74* 3.71* 3.66*  HGB 9.2* 9.0* 8.9*  HCT 27.8* 27.3* 26.8*  MCV 74.3* 73.6* 73.2*  MCH 24.6* 24.3* 24.3*  MCHC 33.1 33.0 33.2  RDW 19.8* 19.8* 19.6*  PLT 511* 485* 493*   Cardiac Enzymes Recent Labs  Lab 01/11/19 1608 01/11/19 2301 01/12/19 0254 01/12/19 0833  TROPONINI <0.03 <0.03 <0.03 <0.03   No results for input(s): TROPIPOC in the last 168 hours.  BNP Recent Labs  Lab 01/11/19 1618  BNP 132.1*    DDimer No results for input(s): DDIMER in the last 168 hours.  Radiology/Studies:  Dg Chest Portable 1 View  Result Date: 01/11/2019 CLINICAL DATA:  Stable enlarged cardiac silhouette. EXAM: PORTABLE CHEST 1 VIEW COMPARISON:  12/29/2018 FINDINGS: Cardiac silhouette is enlarged compared to prior. No effusion, infiltrate or pneumothorax. No acute osseous abnormality. IMPRESSION: Enlarged cardiac silhouette representing either new cardiomegaly or pericardial effusion. Electronically Signed   By: Suzy Bouchard M.D.   On: 01/11/2019 16:57    Assessment and Plan:   1.  Chronic diastolic CHF :  Comp[licated by large pericardial effusion with possible tamponade.     Confusing hemodynamics .   She has 2+ leg edema .  Has acute renal failure with minimal urine output and rapidly increasing creatinine .   For [place] today   2.  Pericardial effusion:  No evidence of RA or RV collapse but this may be due to the presence of pulmonary HTN . For [place] today .   3.   Acute renal insufficiency :   Agree with holding torsemide,    IV albumin Metformin , accupril , Diltiazem are on hold   4.  Hyponatremia :      5.  Atrial fib:    Stable,  Off anticoagulation for the past 2 days   For questions or updates, please contact [place] Please consult [email or web address] for contact info under     Signed, Mertie Moores, MD  01/13/2019 8:21 AM

## 2019-01-13 NOTE — H&P (View-Only) (Signed)
Advanced Heart Failure Team Consult Note   Primary Physician: Hoyt Koch, MD PCP-Cardiologist:  Quay Burow, MD  Reason for Consultation: Pericardial effusion/shock  HPI:    [name] is seen today for evaluation of pericardial effusion and cardiogenic shock at the request of Dr. Acie Fredrickson  [name] has a history of chronic diastolic congestive heart failure, DM2, morbid obesity, COPD and chronic AF.  She was admitted with worsening HF. ECho showed normal EF with very large pericardial effusion without frank tamponade. Denies recent fevers or chills or rash. Course c/b severe hyponatremia and AKI.  Last night with SBP in 70s. Received albumin and NS  This am more SOB and lethargic but able to follow commands.    Review of Systems: [y] = yes, [ ]  = no   . General: Weight gain [ ] ; Weight loss [ ] ; Anorexia [ ] ; Fatigue [ y]; Fever [ ] ; Chills [ ] ; Weakness Blue.Reese ]  . Cardiac: Chest pain/pressure [ ] ; Resting SOB Blue.Reese ]; Exertional SOB Blue.Reese ]; Orthopnea Blue.Reese ]; Pedal Edema Blue.Reese ]; Palpitations [ ] ; Syncope [ ] ; Presyncope [ ] ; Paroxysmal nocturnal dyspnea[y ]  . Pulmonary: Cough [ ] ; Wheezing[ ] ; Hemoptysis[ ] ; Sputum [ ] ; Snoring Blue.Reese ]  . GI: Vomiting[ ] ; Dysphagia[ ] ; Melena[ ] ; Hematochezia [ ] ; Heartburn[ ] ; Abdominal pain [ ] ; Constipation [ ] ; Diarrhea Blue.Reese ]; BRBPR [ ]   . GU: Hematuria[ ] ; Dysuria [ ] ; Nocturia[ ]   . Vascular: Pain in legs with walking [ ] ; Pain in feet with lying flat [ ] ; Non-healing sores [ ] ; Stroke [ ] ; TIA [ ] ; Slurred speech [ ] ;  . Neuro: Headaches[ ] ; Vertigo[ ] ; Seizures[ ] ; Paresthesias[ ] ;Blurred vision [ ] ; Diplopia [ ] ; Vision changes [ ]   . Ortho/Skin: Arthritis Blue.Reese ]; Joint pain [ y]; Muscle pain [ ] ; Joint swelling [ ] ; Back Pain [ ] ; Rash [ ]   . Psych: Depression[ ] ; Anxiety[ ]   . Heme: Bleeding problems [ ] ; Clotting disorders [ ] ; Anemia [ ]   . Endocrine: Diabetes [ y]; Thyroid dysfunction[ ]   Home Medications Prior to Admission  medications   Medication Sig Start Date End Date Taking? Authorizing Provider  ascorbic acid (VITAMIN C) 1000 MG tablet Take 1,000 mg by mouth 2 (two) times daily.    Yes [provider]  CALCIUM-MAG-VIT C-VIT D PO Take 1 tablet by mouth daily.    Yes [provider]  celecoxib (CELEBREX) 200 MG capsule Take 1 capsule (200 mg total) by mouth 2 (two) times daily. 01/02/19  Yes Hoyt Koch, MD  diltiazem (CARDIZEM CD) 180 MG 24 hr capsule TAKE 1 CAPSULE DAILY. Patient taking differently: Take 180 mg by mouth daily.  12/15/18  Yes Lorretta Harp, MD  gabapentin (NEURONTIN) 100 MG capsule TAKE (1) CAPSULE THREE TIMES DAILY. Patient taking differently: Take 100 mg by mouth 3 (three) times daily.  01/06/19  Yes Hoyt Koch, MD  guaiFENesin (MUCINEX) 600 MG 12 hr tablet Take 600 mg by mouth 2 (two) times daily as needed for cough.    Yes [provider]  HYDROcodone-acetaminophen (NORCO/VICODIN) 5-325 MG tablet Take 1 tablet by mouth at bedtime. 01/02/19  Yes Hoyt Koch, MD  INVOKANA 100 MG TABS tablet TAKE 1 TABLET ONCE DAILY. Patient taking differently: Take 100 mg by mouth daily before breakfast.  09/22/18  Yes Hoyt Koch, MD  isosorbide mononitrate (IMDUR) 30 MG 24 hr tablet Take 0.5 tablets (  15 mg total) by mouth daily. 01/06/19  Yes Lorretta Harp, MD  latanoprost (XALATAN) 0.005 % ophthalmic solution Place 1 drop into both eyes at bedtime. 12/09/13  Yes [provider]  LORazepam (ATIVAN) 0.5 MG tablet TAKE 1 OR 2 TABLETS AT BEDTIME. Patient taking differently: Take 0.5-1 mg by mouth at bedtime.  10/14/18  Yes Hoyt Koch, MD  Melatonin 5 MG TABS Take 2-3 tablets by mouth at bedtime.    Yes [provider]  metoprolol succinate (TOPROL-XL) 25 MG 24 hr tablet TAKE ONE TABLET AT BEDTIME. Patient taking differently: Take 25 mg by mouth as needed. Take as needed for palpitations 12/02/18  Yes Sherran Needs,  NP  metoprolol succinate (TOPROL-XL) 50 MG 24 hr tablet Take 100 mg by mouth daily.  12/14/18  Yes [provider]  Multiple Vitamins-Iron (MULTIVITAMINS WITH IRON) TABS Take 1 tablet by mouth daily.    Yes [provider]  nystatin ointment (MYCOSTATIN) Apply 1 application topically 2 (two) times daily as needed (IRRITATION).  07/09/16  Yes [provider]  omeprazole (PRILOSEC) 20 MG capsule Take 1 capsule (20 mg total) by mouth 2 (two) times daily before a meal. 12/31/18  Yes Vann, Jessica U, DO  PARoxetine (PAXIL) 10 MG tablet TAKE 2 AND 1/2 TABS DAILY Patient taking differently: Take 25 mg by mouth daily.  10/13/18  Yes Hoyt Koch, MD  quinapril (ACCUPRIL) 40 MG tablet TAKE 1 TABLET ONCE DAILY. Patient taking differently: Take 40 mg by mouth at bedtime.  09/18/18  Yes Hoyt Koch, MD  rOPINIRole (REQUIP XL) 2 MG 24 hr tablet TAKE ONE TABLET AT BEDTIME. Patient taking differently: Take 2 mg by mouth at bedtime.  07/24/18  Yes Dohmeier, Asencion Partridge, MD  rOPINIRole (REQUIP) 0.5 MG tablet Take 1 to 2 tab at midnight. ( XL form at 6 PM) Patient taking differently: Take 0.5-1 mg by mouth daily as needed (restless legs).  06/30/18  Yes Dohmeier, Asencion Partridge, MD  rosuvastatin (CRESTOR) 10 MG tablet TAKE ONE TABLET AT BEDTIME. Patient taking differently: Take 10 mg by mouth daily.  10/21/18  Yes Lorretta Harp, MD  senna (SENOKOT) 8.6 MG TABS Take 1 tablet by mouth at bedtime.    Yes [provider]  sitaGLIPtin-metformin (JANUMET) 50-1000 MG tablet TAKE 1/2 TABLET IN THE MORNING WITH BREAKFAST AND 1 TABLET IN THE EVENING WITH SUPPER. Patient taking differently: Take 0.5-1 tablets by mouth See admin instructions. TAKE 1/2 TABLET IN THE MORNING WITH BREAKFAST AND 1 TABLET IN THE EVENING WITH SUPPER. 11/17/18  Yes Hoyt Koch, MD  Tiotropium Bromide-Olodaterol (STIOLTO RESPIMAT) 2.5-2.5 MCG/ACT AERS Inhale 2 puffs into the lungs daily. 12/01/18  Yes Young,  Tarri Fuller D, MD  torsemide (DEMADEX) 20 MG tablet Take 2 tablets (40 mg total) by mouth 2 (two) times daily. 01/06/19  Yes Lorretta Harp, MD  XARELTO 20 MG TABS tablet TAKE 1 TABLET ONCE DAILY WITH SUPPER. Patient taking differently: Take 20 mg by mouth daily with supper.  11/25/18  Yes Lorretta Harp, MD  Continuous Blood Gluc Sensor (Golf) MISC Use to monitor sugars 04/23/17   Hoyt Koch, MD  [facility] DELICA LANCETS FINE MISC 1 Units by Does not apply route as directed. 10/05/11   Midge Minium, MD    Past Medical History: Past Medical History:  Diagnosis Date  . Anemia, unspecified   . Arthritis   . Asthma   . Atrial fibrillation with RVR ([place])  a. on Xarelto  . Bell's palsy    Facial nerve decompression in 2001  . CHF (congestive heart failure) ([place])   . Chronic low back pain   . COPD with asthma ([place])   . Coronary artery disease    Myoview 04/12/11 was entirely normal. ECHO 02/26/08 showed only minor abnormalities. Stenting 05/26/08 of her posterolateral branch to the left circumflex coronary artery. Used a 2.5x29mm Taxus Monorail stent.myoview 2014 was without ischemia  . Diabetes mellitus    Type 2  . Early cataracts, bilateral   . Fatty liver   . GERD (gastroesophageal reflux disease)   . Glaucoma   . Glaucoma   . Goiter   . Heart murmur   . History of nuclear stress test 2012; 2014   lexiscan; normal pattern of perfusion; normal, low risk scan   . Hyperlipidemia   . Hypertension   . Panic disorder   . Pneumonia 2008  . Polycystic ovary disease    Hysterectomy in 1982 for this  . Shortness of breath dyspnea    ECHO 02/26/08 showed only minor abnormalities  . Spinal stenosis     Past Surgical History: Past Surgical History:  Procedure Laterality Date  . ABDOMINAL HYSTERECTOMY  1982   & BSO; for polycystic ovary disease  . CARDIOVERSION N/A 12/17/2013   Procedure: CARDIOVERSION;  Surgeon: Pixie Casino, MD;  Location:  [facility] ENDOSCOPY;  Service: Cardiovascular;  Laterality: N/A;  . CARDIOVERSION N/A 09/30/2015   Procedure: CARDIOVERSION;  Surgeon: Dorothy Spark, MD;  Location: [facility] ENDOSCOPY;  Service: Cardiovascular;  Laterality: N/A;  . CARDIOVERSION N/A 06/22/2016   Procedure: CARDIOVERSION;  Surgeon: Skeet Latch, MD;  Location: [place];  Service: Cardiovascular;  Laterality: N/A;  . COLONOSCOPY     last 2009; Dr Cristina Gong; due 2019  . COLONOSCOPY WITH PROPOFOL N/A 01/23/2018   Procedure: COLONOSCOPY WITH PROPOFOL;  Surgeon: Ronald Lobo, MD;  Location: [place];  Service: Endoscopy;  Laterality: N/A;  . CORONARY ANGIOPLASTY  05/26/2008   Stenting of her posterolateral branch to the left circumflex coronary artery. Used a 2.5x68mm Taxus Monorail stent.  . ESOPHAGOGASTRODUODENOSCOPY (EGD) WITH PROPOFOL N/A 01/23/2018   Procedure: ESOPHAGOGASTRODUODENOSCOPY (EGD) WITH PROPOFOL;  Surgeon: Ronald Lobo, MD;  Location: [place];  Service: Endoscopy;  Laterality: N/A;  . FACIAL NERVE DECOMPRESSION  2001/2002   bells palsy   . LAPAROSCOPIC CHOLECYSTECTOMY  06/15/2011    Dr Dalbert Batman  . TEE WITHOUT CARDIOVERSION N/A 12/17/2013   Procedure: TRANSESOPHAGEAL ECHOCARDIOGRAM (TEE);  Surgeon: Pixie Casino, MD;  Location: [facility] ENDOSCOPY;  Service: Cardiovascular;  Laterality: N/A;  trish/ja  . TRANSTHORACIC ECHOCARDIOGRAM  03/21/9832   LV systolic function normal with mild conc LVH; LA mildly dilated; trace MR/TR  . UPPER GI ENDOSCOPY  2009   negative    Family History: Family History  Problem Relation Age of Onset  . Heart attack Father 23       2nd MI at 79  . Colon cancer Brother 75  . Gout Brother   . Ulcers Mother   . Emphysema Mother 65  . Colon polyps Sister   . Cancer Sister        Basal cell carcinoma  . Pneumonia Maternal Grandmother   . Hypertension Brother   . Hyperlipidemia Brother   . Cancer Brother        Skin  . Diabetes Neg Hx   . Stroke Neg Hx     Social History:  Social History   Socioeconomic History  .  Marital status: Married    Spouse name: Not on file  . Number of children: 2  . Years of education: master's  . Highest education level: Not on file  Occupational History  . Occupation: Forensic scientist: [employer]  . Financial resource strain: Not on file  . Food insecurity:    Worry: Not on file    Inability: Not on file  . Transportation needs:    Medical: Not on file    Non-medical: Not on file  Tobacco Use  . Smoking status: Former Smoker    Packs/day: 1.50    Years: 30.00    Pack years: 45.00    Types: Cigarettes    Last attempt to quit: 08/13/1993    Years since quitting: 25.4  . Smokeless tobacco: Never Used  Substance and Sexual Activity  . Alcohol use: No  . Drug use: No  . Sexual activity: Not on file  Lifestyle  . Physical activity:    Days per week: Not on file    Minutes per session: Not on file  . Stress: Not on file  Relationships  . Social connections:    Talks on phone: Not on file    Gets together: Not on file    Attends religious service: Not on file    Active member of club or organization: Not on file    Attends meetings of clubs or organizations: Not on file    Relationship status: Not on file  Other Topics Concern  . Not on file  Social History Narrative  . Not on file    Allergies:  Allergies  Allergen Reactions  . Benadryl [Diphenhydramine Hcl] Other (See Comments)    Restless leg  . Clopidogrel Bisulfate Nausea Only    Nausea & pain  . Contrast Media [Iodinated Diagnostic Agents] Palpitations    Rapid heart rate, hot    Objective:    Vital Signs:   Temp:  [97.3 F (36.3 C)-98.3 F (36.8 C)] 97.9 F (36.6 C) (06/02 0621) Pulse Rate:  [78-102] 102 (06/02 0749) Resp:  [16-18] 18 (06/02 0621) BP: (77-131)/(40-72) 103/63 (06/02 0803) SpO2:  [92 %-100 %] 92 % (06/02 0932) Weight:  [92.9 kg] 92.9 kg (06/02 0621) Last BM Date: 01/12/19  Weight  change: Filed Weights   01/11/19 2210 01/12/19 0457 01/13/19 0621  Weight: 91.7 kg 92.2 kg 92.9 kg    Intake/Output:   Intake/Output Summary (Last 24 hours) at 01/13/2019 0938 Last data filed at 01/13/2019 0401 Gross per 24 hour  Intake 825.22 ml  Output 200 ml  Net 625.22 ml      Physical Exam    General:  Ill appearing. Weak SOB at rest HEENT: normal Neck: supple. JVP to jaw . Carotids 2+ bilat; no bruits. No lymphadenopathy or thyromegaly appreciated. Cor: PMI nondisplaced. Irregular rate & rhythm. No rubs, gallops or murmurs. Lungs: clear Abdomen: obese soft, nontender, nondistended. No hepatosplenomegaly. No bruits or masses. Good bowel sounds. Extremities: no cyanosis, clubbing, rash, tr edema cool Neuro: alert & orientedx3, but lethargic cranial nerves grossly intact. moves all 4 extremities w/o difficulty. Affect pleasant   Telemetry   AF 120-130 s  Labs   Basic Metabolic Panel: Recent Labs  Lab 01/06/19 1342 01/11/19 1608 01/12/19 0254 01/13/19 0457  NA 122* 120* 117* 114*  K 4.2 4.5 5.6* 5.9*  CL 85* 80* 82* 77*  CO2 27 21* 20* 19*  GLUCOSE 139* 217* 207* 177*  BUN  31* 33* 39* 57*  CREATININE 1.33* 1.44* 2.22* 3.57*  CALCIUM 9.0 9.4 8.9 9.1    Liver Function Tests: Recent Labs  Lab 01/06/19 1342 01/11/19 1608  AST 16 22  ALT 11 16  ALKPHOS 69 76  BILITOT 0.5 0.7  PROT 6.9 6.8  ALBUMIN 3.7 3.1*   No results for input(s): LIPASE, AMYLASE in the last 168 hours. No results for input(s): AMMONIA in the last 168 hours.  CBC: Recent Labs  Lab 01/06/19 1342 01/11/19 1608 01/12/19 1458 01/12/19 2029 01/13/19 0457  WBC 10.3 12.4* 16.7* 14.5* 17.7*  NEUTROABS  --  10.8*  --   --   --   HGB 8.5 Repeated and verified X2.* 8.7* 9.2* 9.0* 8.9*  HCT 25.4 Repeated and verified X2.* 27.5* 27.8* 27.3* 26.8*  MCV 75.8* 75.5* 74.3* 73.6* 73.2*  PLT 293.0 384 511* 485* 493*    Cardiac Enzymes: Recent Labs  Lab 01/11/19 1608 01/11/19 2301 01/12/19  0254 01/12/19 0833  TROPONINI <0.03 <0.03 <0.03 <0.03    BNP: BNP (last 3 results) Recent Labs    12/29/18 2152 01/11/19 1618  BNP 277.4* 132.1*    ProBNP (last 3 results) Recent Labs    11/27/18 1547  PROBNP 219.0*     CBG: Recent Labs  Lab 01/12/19 0617 01/12/19 1218 01/12/19 1614 01/12/19 2145 01/13/19 0753  GLUCAP 166* 171* 155* 171* 155*    Coagulation Studies: No results for input(s): LABPROT, INR in the last 72 hours.   Imaging    No results found.   Medications:     Current Medications: . [MAR Hold] HYDROcodone-acetaminophen  1 tablet Oral QHS  . [MAR Hold] insulin aspart  0-5 Units Subcutaneous QHS  . [MAR Hold] insulin aspart  0-9 Units Subcutaneous TID WC  . [MAR Hold] latanoprost  1 drop Both Eyes QHS  . [MAR Hold] Melatonin  9-15 mg Oral QHS  . [MAR Hold] multivitamins with iron  1 tablet Oral Daily  . [MAR Hold] pantoprazole (PROTONIX) IV  40 mg Intravenous Q12H  . [MAR Hold] senna  1 tablet Oral QHS  . [MAR Hold] sodium chloride flush  3 mL Intravenous Q12H  . sodium chloride flush  3 mL Intravenous Q12H  . [MAR Hold] ascorbic acid  1,000 mg Oral BID     Infusions: . [MAR Hold] sodium chloride    . sodium chloride    . sodium chloride 100 mL/hr at 01/13/19 0830      Assessment/Plan   1. Cardiogenic shock 2. Large pericardial effusion suspect pericardial tamponade 3. AKI 4. Hyponatremia 5. Chronic AF 6. Morbid obesity  She is critically ill. Will take emergently to cath lab for R/L cath and probable pericardiocentesis. She is aware of rick of complications including need for mechanical ventilation, myocardial perforation and death  CRITICAL CARE Performed by: Glori Bickers  Total critical care time: 45 minutes  Critical care time was exclusive of separately billable procedures and treating other patients.  Critical care was necessary to treat or prevent imminent or life-threatening deterioration.  Critical  care was time spent personally by me (independent of midlevel providers or residents) on the following activities: development of treatment plan with patient and/or surrogate as well as nursing, discussions with consultants, evaluation of patient's response to treatment, examination of patient, obtaining history from patient or surrogate, ordering and performing treatments and interventions, ordering and review of laboratory studies, ordering and review of radiographic studies, pulse oximetry and re-evaluation of patient's condition.  Length of  Stay: 1  Glori Bickers, MD  01/13/2019, 9:38 AM  Advanced Heart Failure Team Pager [phone] (M-F; 7a - 4p)  Please contact [facility] for night-coverage after hours (4p -7a ) and weekends on [email or web address]

## 2019-01-13 NOTE — H&P (View-Only) (Signed)
Cardiology Consultation:   Patient ID: SARYE KATH MRN: [id number]; DOB: 07/20/1951  Admit date: 01/11/2019 Date of Consult: 01/13/2019  Primary Care Provider: Hoyt Koch, MD Primary Cardiologist: Quay Burow, MD  Primary Electrophysiologist:  None    Patient Profile:   [name] is a 68 y.o. female with a hx of chronic diastolic congestive heart failure,  Coronary artery disease, Diabetes mellitus,  Hypertension who is being seen today for the evaluation of acute exacerbation of her congestive heart failure at the request of  Dr. Wyonia Hough.  History of Present Illness:   Ms. Polson has a history of chronic diastolic congestive heart failure.  She was last seen by Dr. Gwenlyn Found last week.   She was volume overloaded at that time.  Her torsemide was increased from 40 mg once a day to twice a day.  She was to follow-up in the office in several weeks. She admits to not being compliant with her salt intake   Her leg edema improved for several days as did her breathing but then she stopped improving  Creatinine has increased from 1.44 up to 2.22 overnight   Echo has been done    Still eats some processed meats and canned veggies, / soup  BP has been low    Echo yesterday revealed a large pericardial effusion. She has pulmonary HTN - there is no collapse of the RA or RV but there was some concern for early tamponade by Dr. Haroldine Laws .  She is scheduled for a RHC with possible pericardiocentesis today      Past Medical History:  Diagnosis Date   Anemia, unspecified    Arthritis    Asthma    Atrial fibrillation with RVR ([place])    a. on Xarelto   Bell's palsy    Facial nerve decompression in 2001   CHF (congestive heart failure) ([place])    Chronic low back pain    COPD with asthma ([place])    Coronary artery disease    Myoview 04/12/11 was entirely normal. ECHO 02/26/08 showed only minor abnormalities. Stenting 05/26/08 of her posterolateral branch to the  left circumflex coronary artery. Used a 2.5x66mm Taxus Monorail stent.myoview 2014 was without ischemia   Diabetes mellitus    Type 2   Early cataracts, bilateral    Fatty liver    GERD (gastroesophageal reflux disease)    Glaucoma    Glaucoma    Goiter    Heart murmur    History of nuclear stress test 2012; 2014   lexiscan; normal pattern of perfusion; normal, low risk scan    Hyperlipidemia    Hypertension    Panic disorder    Pneumonia 2008   Polycystic ovary disease    Hysterectomy in 1982 for this   Shortness of breath dyspnea    ECHO 02/26/08 showed only minor abnormalities   Spinal stenosis     Past Surgical History:  Procedure Laterality Date   ABDOMINAL HYSTERECTOMY  1982   & BSO; for polycystic ovary disease   CARDIOVERSION N/A 12/17/2013   Procedure: CARDIOVERSION;  Surgeon: Pixie Casino, MD;  Location: [place];  Service: Cardiovascular;  Laterality: N/A;   CARDIOVERSION N/A 09/30/2015   Procedure: CARDIOVERSION;  Surgeon: Dorothy Spark, MD;  Location: [place];  Service: Cardiovascular;  Laterality: N/A;   CARDIOVERSION N/A 06/22/2016   Procedure: CARDIOVERSION;  Surgeon: Skeet Latch, MD;  Location[facility] ENDOSCOPY;  Service: Cardiovascular;  Laterality: N/A;   COLONOSCOPY     last  2009; Dr Cristina Gong; due 2019   COLONOSCOPY WITH PROPOFOL N/A 01/23/2018   Procedure: COLONOSCOPY WITH PROPOFOL;  Surgeon: Ronald Lobo, MD;  Location: [place];  Service: Endoscopy;  Laterality: N/A;   CORONARY ANGIOPLASTY  05/26/2008   Stenting of her posterolateral branch to the left circumflex coronary artery. Used a 2.5x90mm Taxus Monorail stent.   ESOPHAGOGASTRODUODENOSCOPY (EGD) WITH PROPOFOL N/A 01/23/2018   Procedure: ESOPHAGOGASTRODUODENOSCOPY (EGD) WITH PROPOFOL;  Surgeon: Ronald Lobo, MD;  Location: [place];  Service: Endoscopy;  Laterality: N/A;   FACIAL NERVE DECOMPRESSION  2001/2002   bells palsy    LAPAROSCOPIC  CHOLECYSTECTOMY  06/15/2011    Dr Dalbert Batman   TEE WITHOUT CARDIOVERSION N/A 12/17/2013   Procedure: TRANSESOPHAGEAL ECHOCARDIOGRAM (TEE);  Surgeon: Pixie Casino, MD;  Location: [facility] ENDOSCOPY;  Service: Cardiovascular;  Laterality: N/A;  trish/ja   TRANSTHORACIC ECHOCARDIOGRAM  11/22/8784   LV systolic function normal with mild conc LVH; LA mildly dilated; trace MR/TR   UPPER GI ENDOSCOPY  2009   negative     Home Medications:  Prior to Admission medications   Medication Sig Start Date End Date Taking? Authorizing Provider  ascorbic acid (VITAMIN C) 1000 MG tablet Take 1,000 mg by mouth 2 (two) times daily.    Yes [provider]  CALCIUM-MAG-VIT C-VIT D PO Take 1 tablet by mouth daily.    Yes [provider]  celecoxib (CELEBREX) 200 MG capsule Take 1 capsule (200 mg total) by mouth 2 (two) times daily. 01/02/19  Yes Hoyt Koch, MD  diltiazem (CARDIZEM CD) 180 MG 24 hr capsule TAKE 1 CAPSULE DAILY. Patient taking differently: Take 180 mg by mouth daily.  12/15/18  Yes Lorretta Harp, MD  gabapentin (NEURONTIN) 100 MG capsule TAKE (1) CAPSULE THREE TIMES DAILY. Patient taking differently: Take 100 mg by mouth 3 (three) times daily.  01/06/19  Yes Hoyt Koch, MD  guaiFENesin (MUCINEX) 600 MG 12 hr tablet Take 600 mg by mouth 2 (two) times daily as needed for cough.    Yes [provider]  HYDROcodone-acetaminophen (NORCO/VICODIN) 5-325 MG tablet Take 1 tablet by mouth at bedtime. 01/02/19  Yes Hoyt Koch, MD  INVOKANA 100 MG TABS tablet TAKE 1 TABLET ONCE DAILY. Patient taking differently: Take 100 mg by mouth daily before breakfast.  09/22/18  Yes Hoyt Koch, MD  isosorbide mononitrate (IMDUR) 30 MG 24 hr tablet Take 0.5 tablets (15 mg total) by mouth daily. 01/06/19  Yes Lorretta Harp, MD  latanoprost (XALATAN) 0.005 % ophthalmic solution Place 1 drop into both eyes at bedtime. 12/09/13  Yes [provider]    LORazepam (ATIVAN) 0.5 MG tablet TAKE 1 OR 2 TABLETS AT BEDTIME. Patient taking differently: Take 0.5-1 mg by mouth at bedtime.  10/14/18  Yes Hoyt Koch, MD  Melatonin 5 MG TABS Take 2-3 tablets by mouth at bedtime.    Yes [provider]  metoprolol succinate (TOPROL-XL) 25 MG 24 hr tablet TAKE ONE TABLET AT BEDTIME. Patient taking differently: Take 25 mg by mouth as needed. Take as needed for palpitations 12/02/18  Yes Sherran Needs, NP  metoprolol succinate (TOPROL-XL) 50 MG 24 hr tablet Take 100 mg by mouth daily.  12/14/18  Yes [provider]  Multiple Vitamins-Iron (MULTIVITAMINS WITH IRON) TABS Take 1 tablet by mouth daily.    Yes [provider]  nystatin ointment (MYCOSTATIN) Apply 1 application topically 2 (two) times daily as needed (IRRITATION).  07/09/16  Yes [provider]  omeprazole (PRILOSEC) 20 MG capsule Take 1 capsule (20 mg total) by mouth 2 (two) times daily before a meal. 12/31/18  Yes Vann, Jessica U, DO  PARoxetine (PAXIL) 10 MG tablet TAKE 2 AND 1/2 TABS DAILY Patient taking differently: Take 25 mg by mouth daily.  10/13/18  Yes Hoyt Koch, MD  quinapril (ACCUPRIL) 40 MG tablet TAKE 1 TABLET ONCE DAILY. Patient taking differently: Take 40 mg by mouth at bedtime.  09/18/18  Yes Hoyt Koch, MD  rOPINIRole (REQUIP XL) 2 MG 24 hr tablet TAKE ONE TABLET AT BEDTIME. Patient taking differently: Take 2 mg by mouth at bedtime.  07/24/18  Yes Dohmeier, Asencion Partridge, MD  rOPINIRole (REQUIP) 0.5 MG tablet Take 1 to 2 tab at midnight. ( XL form at 6 PM) Patient taking differently: Take 0.5-1 mg by mouth daily as needed (restless legs).  06/30/18  Yes Dohmeier, Asencion Partridge, MD  rosuvastatin (CRESTOR) 10 MG tablet TAKE ONE TABLET AT BEDTIME. Patient taking differently: Take 10 mg by mouth daily.  10/21/18  Yes Lorretta Harp, MD  senna (SENOKOT) 8.6 MG TABS Take 1 tablet by mouth at bedtime.    Yes [provider]   sitaGLIPtin-metformin (JANUMET) 50-1000 MG tablet TAKE 1/2 TABLET IN THE MORNING WITH BREAKFAST AND 1 TABLET IN THE EVENING WITH SUPPER. Patient taking differently: Take 0.5-1 tablets by mouth See admin instructions. TAKE 1/2 TABLET IN THE MORNING WITH BREAKFAST AND 1 TABLET IN THE EVENING WITH SUPPER. 11/17/18  Yes Hoyt Koch, MD  Tiotropium Bromide-Olodaterol (STIOLTO RESPIMAT) 2.5-2.5 MCG/ACT AERS Inhale 2 puffs into the lungs daily. 12/01/18  Yes Young, Tarri Fuller D, MD  torsemide (DEMADEX) 20 MG tablet Take 2 tablets (40 mg total) by mouth 2 (two) times daily. 01/06/19  Yes Lorretta Harp, MD  XARELTO 20 MG TABS tablet TAKE 1 TABLET ONCE DAILY WITH SUPPER. Patient taking differently: Take 20 mg by mouth daily with supper.  11/25/18  Yes Lorretta Harp, MD  Continuous Blood Gluc Sensor ([place]) MISC Use to monitor sugars 04/23/17   Hoyt Koch, MD  [facility] DELICA LANCETS FINE MISC 1 Units by Does not apply route as directed. 10/05/11   Midge Minium, MD    Inpatient Medications: Scheduled Meds:  aspirin  81 mg Oral Pre-Cath   HYDROcodone-acetaminophen  1 tablet Oral QHS   insulin aspart  0-5 Units Subcutaneous QHS   insulin aspart  0-9 Units Subcutaneous TID WC   latanoprost  1 drop Both Eyes QHS   Melatonin  9-15 mg Oral QHS   multivitamins with iron  1 tablet Oral Daily   pantoprazole (PROTONIX) IV  40 mg Intravenous Q12H   senna  1 tablet Oral QHS   sodium chloride flush  3 mL Intravenous Q12H   sodium chloride flush  3 mL Intravenous Q12H   ascorbic acid  1,000 mg Oral BID   Continuous Infusions:  sodium chloride     sodium chloride     sodium chloride     PRN Meds: sodium chloride, sodium chloride, acetaminophen, guaiFENesin, ondansetron (ZOFRAN) IV, sodium chloride flush, sodium chloride flush  Allergies:    Allergies  Allergen Reactions   Benadryl [Diphenhydramine Hcl] Other (See Comments)    Restless leg    Clopidogrel Bisulfate Nausea Only    Nausea & pain   Contrast Media [Iodinated Diagnostic Agents] Palpitations    Rapid heart rate, hot    Social History:   Social History  Socioeconomic History   Marital status: Married    Spouse name: Not on file   Number of children: 2   Years of education: master's   Highest education level: Not on file  Occupational History   Occupation: Electrical engineer    Employer: [employer] resource strain: Not on file   Food insecurity:    Worry: Not on file    Inability: Not on file   Transportation needs:    Medical: Not on file    Non-medical: Not on file  Tobacco Use   Smoking status: Former Smoker    Packs/day: 1.50    Years: 30.00    Pack years: 45.00    Types: Cigarettes    Last attempt to quit: 08/13/1993    Years since quitting: 25.4   Smokeless tobacco: Never Used  Substance and Sexual Activity   Alcohol use: No   Drug use: No   Sexual activity: Not on file  Lifestyle   Physical activity:    Days per week: Not on file    Minutes per session: Not on file   Stress: Not on file  Relationships   Social connections:    Talks on phone: Not on file    Gets together: Not on file    Attends religious service: Not on file    Active member of club or organization: Not on file    Attends meetings of clubs or organizations: Not on file    Relationship status: Not on file   Intimate partner violence:    Fear of current or ex partner: Not on file    Emotionally abused: Not on file    Physically abused: Not on file    Forced sexual activity: Not on file  Other Topics Concern   Not on file  Social History Narrative   Not on file    Family History:    Family History  Problem Relation Age of Onset   Heart attack Father 62       2nd MI at 8   Colon cancer Brother 55   Gout Brother    Ulcers Mother    Emphysema Mother 53   Colon polyps Sister    Cancer Sister         Basal cell carcinoma   Pneumonia Maternal Grandmother    Hypertension Brother    Hyperlipidemia Brother    Cancer Brother        Skin   Diabetes Neg Hx    Stroke Neg Hx      ROS:  Please see the history of present illness.   All other ROS reviewed and negative.     Physical Exam/Data:   Vitals:   01/12/19 2133 01/13/19 0621 01/13/19 0749 01/13/19 0803  BP: (!) 95/40 (!) 131/50 (!) 85/54 103/63  Pulse:  92 (!) 102   Resp:  18    Temp:  97.9 F (36.6 C)    TempSrc:  Oral    SpO2:      Weight:  92.9 kg    Height:        Intake/Output Summary (Last 24 hours) at 01/13/2019 0821 Last data filed at 01/13/2019 0401 Gross per 24 hour  Intake 948.22 ml  Output 200 ml  Net 748.22 ml   Last 3 Weights 01/13/2019 01/12/2019 01/11/2019  Weight (lbs) 204 lb 12.8 oz 203 lb 3.2 oz 202 lb 1.6 oz  Weight (kg) 92.897 kg 92.171 kg 91.672 kg  Body mass index is 37.46 kg/m.  General:  Elderly female,  Very weak this am .  Does not feel well  HEENT: normla  Lymph: no adenopathy Neck: thick neck, difficult to assess JVD  Endocrine:  No thryomegaly Vascular: no carotid bruits.  Cardiac:   Distant heart sounds   ,  Irreg.  Lungs:  Clear  Abd: moderately obese  Ext: 1-2 + edema  Musculoskeletal:  No deformities, BUE and BLE strength normal and equal Skin: extremities are cool.  Neuro:  CN 2-12 intact.   Psych:  Normal affect   EKG:     Telemetry:   A-Fib with controlled V response   Relevant CV Studies:    Laboratory Data:  Chemistry Recent Labs  Lab 01/11/19 1608 01/12/19 0254 01/13/19 0457  NA 120* 117* 114*  K 4.5 5.6* 5.9*  CL 80* 82* 77*  CO2 21* 20* 19*  GLUCOSE 217* 207* 177*  BUN 33* 39* 57*  CREATININE 1.44* 2.22* 3.57*  CALCIUM 9.4 8.9 9.1  GFRNONAA 37* 22* 12*  GFRAA 43* 25* 14*  ANIONGAP 19* 15 18*    Recent Labs  Lab 01/06/19 1342 01/11/19 1608  PROT 6.9 6.8  ALBUMIN 3.7 3.1*  AST 16 22  ALT 11 16  ALKPHOS 69 76  BILITOT 0.5 0.7    Hematology Recent Labs  Lab 01/12/19 1458 01/12/19 2029 01/13/19 0457  WBC 16.7* 14.5* 17.7*  RBC 3.74* 3.71* 3.66*  HGB 9.2* 9.0* 8.9*  HCT 27.8* 27.3* 26.8*  MCV 74.3* 73.6* 73.2*  MCH 24.6* 24.3* 24.3*  MCHC 33.1 33.0 33.2  RDW 19.8* 19.8* 19.6*  PLT 511* 485* 493*   Cardiac Enzymes Recent Labs  Lab 01/11/19 1608 01/11/19 2301 01/12/19 0254 01/12/19 0833  TROPONINI <0.03 <0.03 <0.03 <0.03   No results for input(s): TROPIPOC in the last 168 hours.  BNP Recent Labs  Lab 01/11/19 1618  BNP 132.1*    DDimer No results for input(s): DDIMER in the last 168 hours.  Radiology/Studies:  Dg Chest Portable 1 View  Result Date: 01/11/2019 CLINICAL DATA:  Stable enlarged cardiac silhouette. EXAM: PORTABLE CHEST 1 VIEW COMPARISON:  12/29/2018 FINDINGS: Cardiac silhouette is enlarged compared to prior. No effusion, infiltrate or pneumothorax. No acute osseous abnormality. IMPRESSION: Enlarged cardiac silhouette representing either new cardiomegaly or pericardial effusion. Electronically Signed   By: Suzy Bouchard M.D.   On: 01/11/2019 16:57    Assessment and Plan:   1.  Chronic diastolic CHF :  Comp[licated by large pericardial effusion with possible tamponade.     Confusing hemodynamics .   She has 2+ leg edema .  Has acute renal failure with minimal urine output and rapidly increasing creatinine .   For [place] today   2.  Pericardial effusion:  No evidence of RA or RV collapse but this may be due to the presence of pulmonary HTN . For [place] today .   3.   Acute renal insufficiency :   Agree with holding torsemide,    IV albumin Metformin , accupril , Diltiazem are on hold   4.  Hyponatremia :      5.  Atrial fib:    Stable,  Off anticoagulation for the past 2 days   For questions or updates, please contact [place] Please consult [email or web address] for contact info under     Signed, Mertie Moores, MD  01/13/2019 8:21 AM

## 2019-01-13 NOTE — Interval H&P Note (Signed)
History and Physical Interval Note:  01/13/2019 10:07 AM  [name]  has presented today for pericardiocentesis with the diagnosis of SOB.  The various methods of treatment have been discussed with the patient and family. After consideration of risks, benefits and other options for treatment, the patient has consented to  Procedure(s): RIGHT/LEFT HEART CATH AND CORONARY ANGIOGRAPHY (N/A) as a surgical intervention.  The patient's history has been reviewed, patient examined, no change in status, stable for surgery.  I have reviewed the patient's chart and labs.  Questions were answered to the patient's satisfaction.       Lauree Chandler

## 2019-01-13 NOTE — Progress Notes (Signed)
[facility] PROGRESS NOTE  [name] KZS:[id number] DOB: 1951-05-29 DOA: 01/11/2019 PCP: Hoyt Koch, MD  Assessment/Plan: Acute on chronic diastolic CHF (congestive heart failure) (HCC)-with pericardial effusion. Placed on Lasix 60 mg IV every 12 hours but this discontinued due to hypotension yesterday.  Urine output only 32ml since yesterday afternoon. Received albumin x2 and SBP now 130. Continues with pitting edema and DOE. Echo reveals ef 55%, constrictive physiology, large pericardial effusion without frank evidence of tampanode. Evaluated by cards who rec RHC today. Troponin neg x 3 - continue to hold home antihypertensive meds -for RHC today per cards -appreciate cardiology input -monitor closely  Active Problems:  Acute renal failure: creatinine 3.57  this am trending up from 1.4 on admission. Likely related to above in setting of hypotension.  No documented hx ckd. Home meds include ace inhibitor, torsemide in setting of recent diarrhea and vomiting. Urine output 78ml since yesterday afternoon.  -holding nephrotoxins -await nephrology assistance -monitor urine output -monitor closely   Hyponatremia: serum glucose 117 on admission and worsening today at 114. Likely related to above in setting of intermittent diarrhea and N/V 2 days ago.  No recent antibiotics. Query labile BP. Hx of same but not this low. Lasix stopped yesterday as noted above urine sodium 23 and urine osmolality 258. Serum osmolaltiy 262.  -await nephrology input -monitor closely -improve BP control  Type 2 diabetes mellitus with vascular disease (HCC) serum glucose 217 on admission. A1C 6.5 2/20.  Home meds include invokana. -sliding scale insulin - holding orals  Essential hypertension-patient blood pressure dropped during night to 74. Yesterday and last night received albumin. Remains soft this am.  Holding beta-blocker and calcium channel blocker along with ACE inhibitor. -see  #1 -monitor closely  Diarrhea/ BRBPR. Hx of same. Resolved this am. I BM this morning no blood. fobt neg. Hg stable.  -hold xarelto -hold celebrex -PPI -follow cdiff and gi pathogen panel  Anemia related to chronic blood loss. Hg 8.7 on admission and stable at 8.9 this am. Hx IDA per chart. Required transfusion last admission -see above -monitor -transfuse if hg less than or equal to 7.9  CAD S/P percutaneous coronary angioplasty-stable  COPD mixed type (HCC)-compensated and stable at this time lung exam clear  Permanent atrial fibrillation-holding  Xarelto monitor rate on telemetry    Code Status: full Family Communication: spouse per phone Disposition Plan: home when ready   Consultants:  nahser cards  [facility] nephrology  Procedures:  RHC 6/2  Antibiotics: HPI/Subjective: Sitting on side of bed. No acute distress no complaints  Objective: Vitals:   01/13/19 0749 01/13/19 0803  BP: (!) 85/54 103/63  Pulse: (!) 102   Resp:    Temp:    SpO2:      Intake/Output Summary (Last 24 hours) at 01/13/2019 0850 Last data filed at 01/13/2019 0401 Gross per 24 hour  Intake 945.22 ml  Output 200 ml  Net 745.22 ml   Filed Weights   01/11/19 2210 01/12/19 0457 01/13/19 0621  Weight: 91.7 kg 92.2 kg 92.9 kg    Exam:   General:  Somewhat pale, obese cool to touch no acute distress  Cardiovascular: irregularly irregular no mgr 2+ le edema  Respiratory: normal effort BS distant but clear no wheezes or crackles  Abdomen: obese soft +BS no guarding or rebounding  Musculoskeletal: joints without swelling/erythema   Data Reviewed: Basic Metabolic Panel: Recent Labs  Lab 01/06/19 1342 01/11/19 1608 01/12/19 0254 01/13/19 0457  NA 122* 120*  117* 114*  K 4.2 4.5 5.6* 5.9*  CL 85* 80* 82* 77*  CO2 27 21* 20* 19*  GLUCOSE 139* 217* 207* 177*  BUN 31* 33* 39* 57*  CREATININE 1.33* 1.44* 2.22* 3.57*  CALCIUM 9.0 9.4 8.9 9.1   Liver Function  Tests: Recent Labs  Lab 01/06/19 1342 01/11/19 1608  AST 16 22  ALT 11 16  ALKPHOS 69 76  BILITOT 0.5 0.7  PROT 6.9 6.8  ALBUMIN 3.7 3.1*   No results for input(s): LIPASE, AMYLASE in the last 168 hours. No results for input(s): AMMONIA in the last 168 hours. CBC: Recent Labs  Lab 01/06/19 1342 01/11/19 1608 01/12/19 1458 01/12/19 2029 01/13/19 0457  WBC 10.3 12.4* 16.7* 14.5* 17.7*  NEUTROABS  --  10.8*  --   --   --   HGB 8.5 Repeated and verified X2.* 8.7* 9.2* 9.0* 8.9*  HCT 25.4 Repeated and verified X2.* 27.5* 27.8* 27.3* 26.8*  MCV 75.8* 75.5* 74.3* 73.6* 73.2*  PLT 293.0 384 511* 485* 493*   Cardiac Enzymes: Recent Labs  Lab 01/11/19 1608 01/11/19 2301 01/12/19 0254 01/12/19 0833  TROPONINI <0.03 <0.03 <0.03 <0.03   BNP (last 3 results) Recent Labs    12/29/18 2152 01/11/19 1618  BNP 277.4* 132.1*    ProBNP (last 3 results) Recent Labs    11/27/18 1547  PROBNP 219.0*    CBG: Recent Labs  Lab 01/12/19 0617 01/12/19 1218 01/12/19 1614 01/12/19 2145 01/13/19 0753  GLUCAP 166* 171* 155* 171* 155*    Recent Results (from the past 240 hour(s))  SARS Coronavirus 2 (CEPHEID- Performed in [facility] hospital lab), Hosp Order     Status: None   Collection Time: 01/11/19  4:18 PM  Result Value Ref Range Status   SARS Coronavirus 2 NEGATIVE NEGATIVE Final    Comment: (NOTE) If result is NEGATIVE SARS-CoV-2 target nucleic acids are NOT DETECTED. The SARS-CoV-2 RNA is generally detectable in upper and lower  respiratory specimens during the acute phase of infection. The lowest  concentration of SARS-CoV-2 viral copies this assay can detect is 250  copies / mL. A negative result does not preclude SARS-CoV-2 infection  and should not be used as the sole basis for treatment or other  patient management decisions.  A negative result may occur with  improper specimen collection / handling, submission of specimen other  than nasopharyngeal swab,  presence of viral mutation(s) within the  areas targeted by this assay, and inadequate number of viral copies  (<250 copies / mL). A negative result must be combined with clinical  observations, patient history, and epidemiological information. If result is POSITIVE SARS-CoV-2 target nucleic acids are DETECTED. The SARS-CoV-2 RNA is generally detectable in upper and lower  respiratory specimens dur ing the acute phase of infection.  Positive  results are indicative of active infection with SARS-CoV-2.  Clinical  correlation with patient history and other diagnostic information is  necessary to determine patient infection status.  Positive results do  not rule out bacterial infection or co-infection with other viruses. If result is PRESUMPTIVE POSTIVE SARS-CoV-2 nucleic acids MAY BE PRESENT.   A presumptive positive result was obtained on the submitted specimen  and confirmed on repeat testing.  While 2019 novel coronavirus  (SARS-CoV-2) nucleic acids may be present in the submitted sample  additional confirmatory testing may be necessary for epidemiological  and / or clinical management purposes  to differentiate between  SARS-CoV-2 and other Sarbecovirus currently known to infect  humans.  If clinically indicated additional testing with an alternate test  methodology [phone]) is advised. The SARS-CoV-2 RNA is generally  detectable in upper and lower respiratory sp ecimens during the acute  phase of infection. The expected result is Negative. Fact Sheet for Patients:  StrictlyIdeas.no Fact Sheet for Healthcare Providers: BankingDealers.co.za This test is not yet approved or cleared by the [place] FDA and has been authorized for detection and/or diagnosis of SARS-CoV-2 by FDA under an Emergency Use Authorization (EUA).  This EUA will remain in effect (meaning this test can be used) for the duration of the COVID-19 declaration under  Section 564(b)(1) of the Act, 21 U.S.C. section 360bbb-3(b)(1), unless the authorization is terminated or revoked sooner. Performed at [facility], [place] [address], [place], [place] [address]      Studies: Dg Chest Portable 1 View  Result Date: 01/11/2019 CLINICAL DATA:  Stable enlarged cardiac silhouette. EXAM: PORTABLE CHEST 1 VIEW COMPARISON:  12/29/2018 FINDINGS: Cardiac silhouette is enlarged compared to prior. No effusion, infiltrate or pneumothorax. No acute osseous abnormality. IMPRESSION: Enlarged cardiac silhouette representing either new cardiomegaly or pericardial effusion. Electronically Signed   By: Suzy Bouchard M.D.   On: 01/11/2019 16:57    Scheduled Meds: . HYDROcodone-acetaminophen  1 tablet Oral QHS  . insulin aspart  0-5 Units Subcutaneous QHS  . insulin aspart  0-9 Units Subcutaneous TID WC  . latanoprost  1 drop Both Eyes QHS  . Melatonin  9-15 mg Oral QHS  . multivitamins with iron  1 tablet Oral Daily  . pantoprazole (PROTONIX) IV  40 mg Intravenous Q12H  . senna  1 tablet Oral QHS  . sodium chloride flush  3 mL Intravenous Q12H  . sodium chloride flush  3 mL Intravenous Q12H  . ascorbic acid  1,000 mg Oral BID   Continuous Infusions: . sodium chloride    . sodium chloride    . sodium chloride 100 mL/hr at 01/13/19 0830    Principal Problem:   Acute on chronic diastolic CHF (congestive heart failure) (HCC) Active Problems:   Hyponatremia   Bradycardia   Pericardial effusion   Diarrhea   Shortness of breath   Acute renal failure (HCC)   Type 2 diabetes mellitus with vascular disease (HCC)   Anemia   Essential hypertension   CAD S/P percutaneous coronary angioplasty   Permanent atrial fibrillation   CHF (congestive heart failure) (HCC)   BRBPR (bright red blood per rectum)   COPD mixed type ([place])    Time spent: 35 minutes    [facility] M NP [facility]  If 7PM-7AM, please contact night-coverage at [email or web address],  password [facility] 01/13/2019, 8:50 AM  LOS: 1 day

## 2019-01-13 NOTE — Progress Notes (Signed)
Asked to see patient due to echo showing large pericardial effusion.  Echo personally reviewed showing large pericardial effusion with dilated IVC which is plethoric.  There is no significant respiratory variation in MV inflow velocities but somewhat difficult to assess in setting of atrial fibrillation.    I evaluated patient who was sleeping lying flat without any SOB.  She is SOB when she is up moving around.  BP stable at 122/86mmHg.  She is not tachycardic.  Discussed case with Dr. Haroldine Laws.  Will plan RHC in am to assess filling pressures and help sort out hemodynamics of effusion.  She is on Xarelto for afib but this has been on hold.  Will transfer to stepdown unit.  Likely will need pericardiocentesis. Her last does of Xarelto was 5/31 PM.

## 2019-01-13 NOTE — Progress Notes (Signed)
Pt has tried to urinate 3 times since arriving on floor with no success. Bladder scan showed 213 mL. Will continue to monitor and have patient try again within the next hour.

## 2019-01-13 NOTE — Interval H&P Note (Signed)
History and Physical Interval Note:  01/13/2019 9:46 AM  [name]  has presented today for surgery, with the diagnosis of SOB.  The various methods of treatment have been discussed with the patient and family. After consideration of risks, benefits and other options for treatment, the patient has consented to  Procedure(s): RIGHT/LEFT HEART CATH AND CORONARY ANGIOGRAPHY (N/A) and possible pericardiocentesis as a surgical intervention.  The patient's history has been reviewed, patient examined, no change in status, stable for surgery.  I have reviewed the patient's chart and labs.  Questions were answered to the patient's satisfaction.     Rafferty Postlewait

## 2019-01-13 NOTE — Evaluation (Signed)
Physical Therapy Evaluation Patient Details Name: [name] MRN: [id number] DOB: 10/19/1950 Today's Date: 01/13/2019   History of Present Illness  68 y.o. female admitted with CHF exacerbation. Pt with diarrhea with bleeding 6/1 as well as decreased BP and extremity cyanosis. PMhx:  diastolic CHF, A. fib on Xarelto, COPD, CAD, glaucoma, panic disorder, low back pain, DM  Clinical Impression  Pt pleasant, sitting EOB on arrival. Pt awake but slow to process and respond to questions with noted confusion, decreased STM and significant cognitive change from baseline. 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 Pt with HR 102 in Afib with activity. Pt normally independent and lives at home with partner and 2 adult sons. Pt with decreased cognition, balance, activity tolerance who will benefit from acute therapy to maximize mobility, function and safety to decrease fall risk and burden of care.   Of note pt mentioned she fell asleep standing brushing her teeth less than 3 mo ago and fell backward hitting her head. She also reports other times of falling asleep and falling off bed or chair.   BP 85/54 sitting EOB 103/63 in chair mid session    Follow Up Recommendations Home health PT    Equipment Recommendations  Rolling walker with 5" wheels    Recommendations for Other Services OT consult     Precautions / Restrictions Precautions Precautions: Fall Precaution Comments: watch BP and O2      Mobility  Bed Mobility Overal bed mobility: Needs Assistance Bed Mobility: Sit to Supine       Sit to supine: Min assist   General bed mobility comments: EOB on arrival, min assist of lower body to return to bed for pt safety end of session  Transfers Overall transfer level: Needs assistance   Transfers: Sit to/from Stand Sit to Stand: Min guard         General transfer comment: pt stood from bed, chair and toilet all with guarding for lines and  safety   Ambulation/Gait Ambulation/Gait assistance: Min assist Gait Distance (Feet): 15 Feet Assistive device: None Gait Pattern/deviations: Step-through pattern;Decreased stride length   Gait velocity interpretation: <1.8 ft/sec, indicate of risk for recurrent falls General Gait Details: pt with decreased stability in standing with very close guarding throughout with a few partial LOB with tactile cues to correct. Pt walked from bed to sink then chair at window. However, after pt situated stated need to void and refused BSC so walked to bathroom then back to bed due to impaired cognition and maintained Afib. SPo2 reading 85-95% throughout with and without 2L O2. On 2L end of session   Stairs            Wheelchair Mobility    Modified Rankin (Stroke Patients Only)       Balance Overall balance assessment: Needs assistance   Sitting balance-Leahy Scale: Good Sitting balance - Comments: static sitting EOB on arrival without LOB     Standing balance-Leahy Scale: Fair Standing balance comment: pt able to stand at sink to rinse mouth without assist                             Pertinent Vitals/Pain Pain Assessment: No/denies pain    Home Living Family/patient expects to be discharged to:: Private residence Living Arrangements: Spouse/significant other;Children Available Help at Discharge: Family;Available 24 hours/day Type of Home: House Home Access: Stairs to enter  Entrance Stairs-Number of Steps: 3 Home Layout: One level Home Equipment: Clinical cytogeneticist - 2 wheels;Cane - single point Additional Comments: DME belongs to spouse    Prior Function Level of Independence: Independent               Hand Dominance        Extremity/Trunk Assessment   Upper Extremity Assessment Upper Extremity Assessment: Generalized weakness    Lower Extremity Assessment Lower Extremity Assessment: Generalized weakness    Cervical / Trunk  Assessment Cervical / Trunk Assessment: Kyphotic  Communication   Communication: No difficulties  Cognition Arousal/Alertness: Awake/alert Behavior During Therapy: Flat affect Overall Cognitive Status: Impaired/Different from baseline Area of Impairment: Memory;Problem solving                     Memory: Decreased short-term memory       Problem Solving: Slow processing General Comments: pt with hyponatremia and having extreme difficulty with sTM, problem solving and awareness with slight lethargy. Pt is a working SLP so this is very different from her baseline      General Comments      Exercises     Assessment/Plan    PT Assessment Patient needs continued PT services  PT Problem List Decreased strength;Decreased balance;Decreased cognition;Decreased knowledge of precautions;Decreased mobility;Decreased activity tolerance;Decreased coordination;Decreased safety awareness;Decreased knowledge of use of DME       PT Treatment Interventions Gait training;Therapeutic activities;Neuromuscular re-education;Cognitive remediation;Stair training;Therapeutic exercise;DME instruction;Functional mobility training;Balance training;Patient/family education    PT Goals (Current goals can be found in the Care Plan section)  Acute Rehab PT Goals Patient Stated Goal: return to work and home PT Goal Formulation: With patient Time For Goal Achievement: 01/27/19 Potential to Achieve Goals: Fair    Frequency Min 3X/week   Barriers to discharge        Co-evaluation               AM-PAC PT "6 Clicks" Mobility  Outcome Measure Help needed turning from your back to your side while in a flat bed without using bedrails?: A Little Help needed moving from lying on your back to sitting on the side of a flat bed without using bedrails?: A Little Help needed moving to and from a bed to a chair (including a wheelchair)?: A Little Help needed standing up from a chair using your arms  (e.g., wheelchair or bedside chair)?: A Little Help needed to walk in hospital room?: A Little Help needed climbing 3-5 steps with a railing? : A Lot 6 Click Score: 17    End of Session Equipment Utilized During Treatment: Gait belt;Oxygen Activity Tolerance: Patient tolerated treatment well Patient left: with call bell/phone within reach;in bed;with bed alarm set Nurse Communication: Mobility status;Precautions PT Visit Diagnosis: Other abnormalities of gait and mobility (R26.89);Muscle weakness (generalized) (M62.81);History of falling (Z91.81)    Time: 3419-6222 PT Time Calculation (min) (ACUTE ONLY): 44 min   Charges:   PT Evaluation $PT Eval Moderate Complexity: 1 Mod PT Treatments $Gait Training: 8-22 mins $Therapeutic Activity: 8-22 mins        Samanvi Cuccia Pam Drown, PT Acute Rehabilitation Services Pager: [phone] Office: [place] 01/13/2019, 12:42 PM

## 2019-01-13 NOTE — Progress Notes (Signed)
Advanced Heart Failure Team Consult Note   Primary Physician: Hoyt Koch, MD PCP-Cardiologist:  Quay Burow, MD  Reason for Consultation: Pericardial effusion/shock  HPI:    [name] is seen today for evaluation of pericardial effusion and cardiogenic shock at the request of Dr. Acie Fredrickson  [name] has a history of chronic diastolic congestive heart failure, DM2, morbid obesity, COPD and chronic AF.  [name] was admitted with worsening HF. ECho showed normal EF with very large pericardial effusion without frank tamponade. Denies recent fevers or chills or rash. Course c/b severe hyponatremia and AKI.  Last night with SBP in 70s. Received albumin and NS  This am more SOB and lethargic but able to follow commands.    Review of Systems: [y] = yes, [ ]  = no   . General: Weight gain [ ] ; Weight loss [ ] ; Anorexia [ ] ; Fatigue [ y]; Fever [ ] ; Chills [ ] ; Weakness Blue.Reese ]  . Cardiac: Chest pain/pressure [ ] ; Resting SOB Blue.Reese ]; Exertional SOB Blue.Reese ]; Orthopnea Blue.Reese ]; Pedal Edema Blue.Reese ]; Palpitations [ ] ; Syncope [ ] ; Presyncope [ ] ; Paroxysmal nocturnal dyspnea[y ]  . Pulmonary: Cough [ ] ; Wheezing[ ] ; Hemoptysis[ ] ; Sputum [ ] ; Snoring Blue.Reese ]  . GI: Vomiting[ ] ; Dysphagia[ ] ; Melena[ ] ; Hematochezia [ ] ; Heartburn[ ] ; Abdominal pain [ ] ; Constipation [ ] ; Diarrhea Blue.Reese ]; BRBPR [ ]   . GU: Hematuria[ ] ; Dysuria [ ] ; Nocturia[ ]   . Vascular: Pain in legs with walking [ ] ; Pain in feet with lying flat [ ] ; Non-healing sores [ ] ; Stroke [ ] ; TIA [ ] ; Slurred speech [ ] ;  . Neuro: Headaches[ ] ; Vertigo[ ] ; Seizures[ ] ; Paresthesias[ ] ;Blurred vision [ ] ; Diplopia [ ] ; Vision changes [ ]   . Ortho/Skin: Arthritis Blue.Reese ]; Joint pain [ y]; Muscle pain [ ] ; Joint swelling [ ] ; Back Pain [ ] ; Rash [ ]   . Psych: Depression[ ] ; Anxiety[ ]   . Heme: Bleeding problems [ ] ; Clotting disorders [ ] ; Anemia [ ]   . Endocrine: Diabetes [ y]; Thyroid dysfunction[ ]   Home Medications Prior to Admission  medications   Medication Sig Start Date End Date Taking? Authorizing Provider  ascorbic acid (VITAMIN C) 1000 MG tablet Take 1,000 mg by mouth 2 (two) times daily.    Yes [provider]  CALCIUM-MAG-VIT C-VIT D PO Take 1 tablet by mouth daily.    Yes [provider]  celecoxib (CELEBREX) 200 MG capsule Take 1 capsule (200 mg total) by mouth 2 (two) times daily. 01/02/19  Yes Hoyt Koch, MD  diltiazem (CARDIZEM CD) 180 MG 24 hr capsule TAKE 1 CAPSULE DAILY. Patient taking differently: Take 180 mg by mouth daily.  12/15/18  Yes Lorretta Harp, MD  gabapentin (NEURONTIN) 100 MG capsule TAKE (1) CAPSULE THREE TIMES DAILY. Patient taking differently: Take 100 mg by mouth 3 (three) times daily.  01/06/19  Yes Hoyt Koch, MD  guaiFENesin (MUCINEX) 600 MG 12 hr tablet Take 600 mg by mouth 2 (two) times daily as needed for cough.    Yes [provider]  HYDROcodone-acetaminophen (NORCO/VICODIN) 5-325 MG tablet Take 1 tablet by mouth at bedtime. 01/02/19  Yes Hoyt Koch, MD  INVOKANA 100 MG TABS tablet TAKE 1 TABLET ONCE DAILY. Patient taking differently: Take 100 mg by mouth daily before breakfast.  09/22/18  Yes Hoyt Koch, MD  isosorbide mononitrate (IMDUR) 30 MG 24 hr tablet Take 0.5 tablets (  15 mg total) by mouth daily. 01/06/19  Yes Lorretta Harp, MD  latanoprost (XALATAN) 0.005 % ophthalmic solution Place 1 drop into both eyes at bedtime. 12/09/13  Yes [provider]  LORazepam (ATIVAN) 0.5 MG tablet TAKE 1 OR 2 TABLETS AT BEDTIME. Patient taking differently: Take 0.5-1 mg by mouth at bedtime.  10/14/18  Yes Hoyt Koch, MD  Melatonin 5 MG TABS Take 2-3 tablets by mouth at bedtime.    Yes [provider]  metoprolol succinate (TOPROL-XL) 25 MG 24 hr tablet TAKE ONE TABLET AT BEDTIME. Patient taking differently: Take 25 mg by mouth as needed. Take as needed for palpitations 12/02/18  Yes Sherran Needs,  NP  metoprolol succinate (TOPROL-XL) 50 MG 24 hr tablet Take 100 mg by mouth daily.  12/14/18  Yes [provider]  Multiple Vitamins-Iron (MULTIVITAMINS WITH IRON) TABS Take 1 tablet by mouth daily.    Yes [provider]  nystatin ointment (MYCOSTATIN) Apply 1 application topically 2 (two) times daily as needed (IRRITATION).  07/09/16  Yes [provider]  omeprazole (PRILOSEC) 20 MG capsule Take 1 capsule (20 mg total) by mouth 2 (two) times daily before a meal. 12/31/18  Yes Vann, Jessica U, DO  PARoxetine (PAXIL) 10 MG tablet TAKE 2 AND 1/2 TABS DAILY Patient taking differently: Take 25 mg by mouth daily.  10/13/18  Yes Hoyt Koch, MD  quinapril (ACCUPRIL) 40 MG tablet TAKE 1 TABLET ONCE DAILY. Patient taking differently: Take 40 mg by mouth at bedtime.  09/18/18  Yes Hoyt Koch, MD  rOPINIRole (REQUIP XL) 2 MG 24 hr tablet TAKE ONE TABLET AT BEDTIME. Patient taking differently: Take 2 mg by mouth at bedtime.  07/24/18  Yes Dohmeier, Asencion Partridge, MD  rOPINIRole (REQUIP) 0.5 MG tablet Take 1 to 2 tab at midnight. ( XL form at 6 PM) Patient taking differently: Take 0.5-1 mg by mouth daily as needed (restless legs).  06/30/18  Yes Dohmeier, Asencion Partridge, MD  rosuvastatin (CRESTOR) 10 MG tablet TAKE ONE TABLET AT BEDTIME. Patient taking differently: Take 10 mg by mouth daily.  10/21/18  Yes Lorretta Harp, MD  senna (SENOKOT) 8.6 MG TABS Take 1 tablet by mouth at bedtime.    Yes [provider]  sitaGLIPtin-metformin (JANUMET) 50-1000 MG tablet TAKE 1/2 TABLET IN THE MORNING WITH BREAKFAST AND 1 TABLET IN THE EVENING WITH SUPPER. Patient taking differently: Take 0.5-1 tablets by mouth See admin instructions. TAKE 1/2 TABLET IN THE MORNING WITH BREAKFAST AND 1 TABLET IN THE EVENING WITH SUPPER. 11/17/18  Yes Hoyt Koch, MD  Tiotropium Bromide-Olodaterol (STIOLTO RESPIMAT) 2.5-2.5 MCG/ACT AERS Inhale 2 puffs into the lungs daily. 12/01/18  Yes Young,  Tarri Fuller D, MD  torsemide (DEMADEX) 20 MG tablet Take 2 tablets (40 mg total) by mouth 2 (two) times daily. 01/06/19  Yes Lorretta Harp, MD  XARELTO 20 MG TABS tablet TAKE 1 TABLET ONCE DAILY WITH SUPPER. Patient taking differently: Take 20 mg by mouth daily with supper.  11/25/18  Yes Lorretta Harp, MD  Continuous Blood Gluc Sensor ([place]) MISC Use to monitor sugars 04/23/17   Hoyt Koch, MD  [facility] DELICA LANCETS FINE MISC 1 Units by Does not apply route as directed. 10/05/11   Midge Minium, MD    Past Medical History: Past Medical History:  Diagnosis Date  . Anemia, unspecified   . Arthritis   . Asthma   . Atrial fibrillation with RVR ([place])  a. on Xarelto  . Bell's palsy    Facial nerve decompression in 2001  . CHF (congestive heart failure) ([place])   . Chronic low back pain   . COPD with asthma ([employer])   . Coronary artery disease    Myoview 04/12/11 was entirely normal. ECHO 02/26/08 showed only minor abnormalities. Stenting 05/26/08 of her posterolateral branch to the left circumflex coronary artery. Used a 2.5x75mm Taxus Monorail stent.myoview 2014 was without ischemia  . Diabetes mellitus    Type 2  . Early cataracts, bilateral   . Fatty liver   . GERD (gastroesophageal reflux disease)   . Glaucoma   . Glaucoma   . Goiter   . Heart murmur   . History of nuclear stress test 2012; 2014   lexiscan; normal pattern of perfusion; normal, low risk scan   . Hyperlipidemia   . Hypertension   . Panic disorder   . Pneumonia 2008  . Polycystic ovary disease    Hysterectomy in 1982 for this  . Shortness of breath dyspnea    ECHO 02/26/08 showed only minor abnormalities  . Spinal stenosis     Past Surgical History: Past Surgical History:  Procedure Laterality Date  . ABDOMINAL HYSTERECTOMY  1982   & BSO; for polycystic ovary disease  . CARDIOVERSION N/A 12/17/2013   Procedure: CARDIOVERSION;  Surgeon: Pixie Casino, MD;  Location:  [facility] ENDOSCOPY;  Service: Cardiovascular;  Laterality: N/A;  . CARDIOVERSION N/A 09/30/2015   Procedure: CARDIOVERSION;  Surgeon: Dorothy Spark, MD;  Location: [facility] ENDOSCOPY;  Service: Cardiovascular;  Laterality: N/A;  . CARDIOVERSION N/A 06/22/2016   Procedure: CARDIOVERSION;  Surgeon: Skeet Latch, MD;  Location: [place];  Service: Cardiovascular;  Laterality: N/A;  . COLONOSCOPY     last 2009; Dr Cristina Gong; due 2019  . COLONOSCOPY WITH PROPOFOL N/A 01/23/2018   Procedure: COLONOSCOPY WITH PROPOFOL;  Surgeon: Ronald Lobo, MD;  Location: [place];  Service: Endoscopy;  Laterality: N/A;  . CORONARY ANGIOPLASTY  05/26/2008   Stenting of her posterolateral branch to the left circumflex coronary artery. Used a 2.5x40mm Taxus Monorail stent.  . ESOPHAGOGASTRODUODENOSCOPY (EGD) WITH PROPOFOL N/A 01/23/2018   Procedure: ESOPHAGOGASTRODUODENOSCOPY (EGD) WITH PROPOFOL;  Surgeon: Ronald Lobo, MD;  Location: [place];  Service: Endoscopy;  Laterality: N/A;  . FACIAL NERVE DECOMPRESSION  2001/2002   bells palsy   . LAPAROSCOPIC CHOLECYSTECTOMY  06/15/2011    Dr Dalbert Batman  . TEE WITHOUT CARDIOVERSION N/A 12/17/2013   Procedure: TRANSESOPHAGEAL ECHOCARDIOGRAM (TEE);  Surgeon: Pixie Casino, MD;  Location[facility] ENDOSCOPY;  Service: Cardiovascular;  Laterality: N/A;  trish/ja  . TRANSTHORACIC ECHOCARDIOGRAM  7/62/8315   LV systolic function normal with mild conc LVH; LA mildly dilated; trace MR/TR  . UPPER GI ENDOSCOPY  2009   negative    Family History: Family History  Problem Relation Age of Onset  . Heart attack Father 66       2nd MI at 10  . Colon cancer Brother 19  . Gout Brother   . Ulcers Mother   . Emphysema Mother 42  . Colon polyps Sister   . Cancer Sister        Basal cell carcinoma  . Pneumonia Maternal Grandmother   . Hypertension Brother   . Hyperlipidemia Brother   . Cancer Brother        Skin  . Diabetes Neg Hx   . Stroke Neg Hx     Social History:  Social History   Socioeconomic History  .  Marital status: Married    Spouse name: Not on file  . Number of children: 2  . Years of education: master's  . Highest education level: Not on file  Occupational History  . Occupation: Forensic scientist: [employer]  . Financial resource strain: Not on file  . Food insecurity:    Worry: Not on file    Inability: Not on file  . Transportation needs:    Medical: Not on file    Non-medical: Not on file  Tobacco Use  . Smoking status: Former Smoker    Packs/day: 1.50    Years: 30.00    Pack years: 45.00    Types: Cigarettes    Last attempt to quit: 08/13/1993    Years since quitting: 25.4  . Smokeless tobacco: Never Used  Substance and Sexual Activity  . Alcohol use: No  . Drug use: No  . Sexual activity: Not on file  Lifestyle  . Physical activity:    Days per week: Not on file    Minutes per session: Not on file  . Stress: Not on file  Relationships  . Social connections:    Talks on phone: Not on file    Gets together: Not on file    Attends religious service: Not on file    Active member of club or organization: Not on file    Attends meetings of clubs or organizations: Not on file    Relationship status: Not on file  Other Topics Concern  . Not on file  Social History Narrative  . Not on file    Allergies:  Allergies  Allergen Reactions  . Benadryl [Diphenhydramine Hcl] Other (See Comments)    Restless leg  . Clopidogrel Bisulfate Nausea Only    Nausea & pain  . Contrast Media [Iodinated Diagnostic Agents] Palpitations    Rapid heart rate, hot    Objective:    Vital Signs:   Temp:  [97.3 F (36.3 C)-98.3 F (36.8 C)] 97.9 F (36.6 C) (06/02 0621) Pulse Rate:  [78-102] 102 (06/02 0749) Resp:  [16-18] 18 (06/02 0621) BP: (77-131)/(40-72) 103/63 (06/02 0803) SpO2:  [92 %-100 %] 92 % (06/02 0932) Weight:  [92.9 kg] 92.9 kg (06/02 0621) Last BM Date: 01/12/19  Weight  change: Filed Weights   01/11/19 2210 01/12/19 0457 01/13/19 0621  Weight: 91.7 kg 92.2 kg 92.9 kg    Intake/Output:   Intake/Output Summary (Last 24 hours) at 01/13/2019 0938 Last data filed at 01/13/2019 0401 Gross per 24 hour  Intake 825.22 ml  Output 200 ml  Net 625.22 ml      Physical Exam    General:  Ill appearing. Weak SOB at rest HEENT: normal Neck: supple. JVP to jaw . Carotids 2+ bilat; no bruits. No lymphadenopathy or thyromegaly appreciated. Cor: PMI nondisplaced. Irregular rate & rhythm. No rubs, gallops or murmurs. Lungs: clear Abdomen: obese soft, nontender, nondistended. No hepatosplenomegaly. No bruits or masses. Good bowel sounds. Extremities: no cyanosis, clubbing, rash, tr edema cool Neuro: alert & orientedx3, but lethargic cranial nerves grossly intact. moves all 4 extremities w/o difficulty. Affect pleasant   Telemetry   AF 120-130 s  Labs   Basic Metabolic Panel: Recent Labs  Lab 01/06/19 1342 01/11/19 1608 01/12/19 0254 01/13/19 0457  NA 122* 120* 117* 114*  K 4.2 4.5 5.6* 5.9*  CL 85* 80* 82* 77*  CO2 27 21* 20* 19*  GLUCOSE 139* 217* 207* 177*  BUN  31* 33* 39* 57*  CREATININE 1.33* 1.44* 2.22* 3.57*  CALCIUM 9.0 9.4 8.9 9.1    Liver Function Tests: Recent Labs  Lab 01/06/19 1342 01/11/19 1608  AST 16 22  ALT 11 16  ALKPHOS 69 76  BILITOT 0.5 0.7  PROT 6.9 6.8  ALBUMIN 3.7 3.1*   No results for input(s): LIPASE, AMYLASE in the last 168 hours. No results for input(s): AMMONIA in the last 168 hours.  CBC: Recent Labs  Lab 01/06/19 1342 01/11/19 1608 01/12/19 1458 01/12/19 2029 01/13/19 0457  WBC 10.3 12.4* 16.7* 14.5* 17.7*  NEUTROABS  --  10.8*  --   --   --   HGB 8.5 Repeated and verified X2.* 8.7* 9.2* 9.0* 8.9*  HCT 25.4 Repeated and verified X2.* 27.5* 27.8* 27.3* 26.8*  MCV 75.8* 75.5* 74.3* 73.6* 73.2*  PLT 293.0 384 511* 485* 493*    Cardiac Enzymes: Recent Labs  Lab 01/11/19 1608 01/11/19 2301 01/12/19  0254 01/12/19 0833  TROPONINI <0.03 <0.03 <0.03 <0.03    BNP: BNP (last 3 results) Recent Labs    12/29/18 2152 01/11/19 1618  BNP 277.4* 132.1*    ProBNP (last 3 results) Recent Labs    11/27/18 1547  PROBNP 219.0*     CBG: Recent Labs  Lab 01/12/19 0617 01/12/19 1218 01/12/19 1614 01/12/19 2145 01/13/19 0753  GLUCAP 166* 171* 155* 171* 155*    Coagulation Studies: No results for input(s): LABPROT, INR in the last 72 hours.   Imaging    No results found.   Medications:     Current Medications: . [MAR Hold] HYDROcodone-acetaminophen  1 tablet Oral QHS  . [MAR Hold] insulin aspart  0-5 Units Subcutaneous QHS  . [MAR Hold] insulin aspart  0-9 Units Subcutaneous TID WC  . [MAR Hold] latanoprost  1 drop Both Eyes QHS  . [MAR Hold] Melatonin  9-15 mg Oral QHS  . [MAR Hold] multivitamins with iron  1 tablet Oral Daily  . [MAR Hold] pantoprazole (PROTONIX) IV  40 mg Intravenous Q12H  . [MAR Hold] senna  1 tablet Oral QHS  . [MAR Hold] sodium chloride flush  3 mL Intravenous Q12H  . sodium chloride flush  3 mL Intravenous Q12H  . [MAR Hold] ascorbic acid  1,000 mg Oral BID     Infusions: . [MAR Hold] sodium chloride    . sodium chloride    . sodium chloride 100 mL/hr at 01/13/19 0830      Assessment/Plan   1. Cardiogenic shock 2. Large pericardial effusion suspect pericardial tamponade 3. AKI 4. Hyponatremia 5. Chronic AF 6. Morbid obesity  [name] is critically ill. Will take emergently to cath lab for R/L cath and probable pericardiocentesis. [name] is aware of rick of complications including need for mechanical ventilation, myocardial perforation and death  CRITICAL CARE Performed by: Glori Bickers  Total critical care time: 45 minutes  Critical care time was exclusive of separately billable procedures and treating other patients.  Critical care was necessary to treat or prevent imminent or life-threatening deterioration.  Critical  care was time spent personally by me (independent of midlevel providers or residents) on the following activities: development of treatment plan with patient and/or surrogate as well as nursing, discussions with consultants, evaluation of patient's response to treatment, examination of patient, obtaining history from patient or surrogate, ordering and performing treatments and interventions, ordering and review of laboratory studies, ordering and review of radiographic studies, pulse oximetry and re-evaluation of patient's condition.  Length of  Stay: 1  Glori Bickers, MD  01/13/2019, 9:38 AM  Advanced Heart Failure Team Pager [phone] (M-F; 7a - 4p)  Please contact [facility] for night-coverage after hours (4p -7a ) and weekends on [email or web address]

## 2019-01-13 NOTE — Consult Note (Signed)
[facility] Renal Consultation Note  Requesting MD: Spongberg Indication for Consultation: AKI, hyponatremia  HPI:  [name] is a 68 y.o. female with past medical history significant for diabetes mellitus, hypertension, coronary artery disease as well as a history of chronic diastolic heart failure.  She has no history of renal disease, creatinine on 12/31/2018 was 0.74.  Apparently, she was seen by outpatient cardiology a week ago and appeared volume overloaded.  Her torsemide was increased from 40 daily to 40 twice daily-this improve the situation transiently but she did need to present to the emergency department on 5/31 with shortness of breath.  Her creatinine was 1.44 at that time.  Echocardiogram showed pericardial effusion.  Apparently, the plan was to diurese but no urine output was recorded.  Creatinine worsened to 2.2 yesterday and 3.57 today.  A total of only 200 urine output has been recorded, blood pressures have been very low.  She underwent cardiac catheterization today as well as pericardiocentesis-remove 900 cc-now in 2 heart, pressures were up.  Also, was noted to be hyponatremic- actually seems to run in the 120's- worsening with all this, today 114.  She appears alert and oriented in 2H-  minimal UOP.  Was on ACE as OP as well as celebrex   Creatinine, Ser  Date/Time Value Ref Range Status  01/13/2019 04:57 AM 3.57 (H) 0.44 - 1.00 mg/dL Final    Comment:    DELTA CHECK NOTED  01/12/2019 02:54 AM 2.22 (H) 0.44 - 1.00 mg/dL Final  01/11/2019 04:08 PM 1.44 (H) 0.44 - 1.00 mg/dL Final  01/06/2019 01:42 PM 1.33 (H) 0.40 - 1.20 mg/dL Final  12/31/2018 02:39 PM 0.74 0.44 - 1.00 mg/dL Final  12/31/2018 04:26 AM 0.83 0.44 - 1.00 mg/dL Final  12/30/2018 07:53 AM 0.90 0.44 - 1.00 mg/dL Final  12/29/2018 09:52 PM 0.91 0.44 - 1.00 mg/dL Final  12/22/2018 02:34 PM 0.81 0.57 - 1.00 mg/dL Final  12/02/2018 03:43 PM 0.69 0.57 - 1.00 mg/dL Final  10/02/2018 12:03 PM 0.73  0.40 - 1.20 mg/dL Final  06/09/2018 03:06 PM 0.72 0.40 - 1.20 mg/dL Final  01/03/2018 10:13 PM 0.97 0.44 - 1.00 mg/dL Final  12/04/2017 10:06 AM 0.76 0.44 - 1.00 mg/dL Final  10/28/2017 02:30 PM 0.69 0.40 - 1.20 mg/dL Final  04/23/2017 02:29 PM 0.74 0.40 - 1.20 mg/dL Final  11/15/2016 03:17 PM 0.73 0.40 - 1.20 mg/dL Final  06/20/2016 03:20 PM 0.74 0.44 - 1.00 mg/dL Final  09/26/2015 03:35 PM 0.80 0.44 - 1.00 mg/dL Final  08/03/2015 01:51 PM 0.74 0.40 - 1.20 mg/dL Final  03/27/2015 05:27 PM 0.77 0.44 - 1.00 mg/dL Final  12/24/2014 12:00 PM 0.72 0.40 - 1.20 mg/dL Final  04/25/2014 05:59 PM 0.70 0.50 - 1.10 mg/dL Final  04/01/2014 02:44 PM 0.6 0.4 - 1.2 mg/dL Final  12/17/2013 01:41 AM 0.70 0.50 - 1.10 mg/dL Final  12/16/2013 01:30 PM 0.70 0.50 - 1.10 mg/dL Final  07/14/2012 11:57 AM 0.7 0.4 - 1.2 mg/dL Final  06/11/2011 12:10 PM 0.62 0.50 - 1.10 mg/dL Final  05/18/2010 12:20 PM 0.7 0.4 - 1.2 mg/dL Final  05/06/2009 12:16 PM 0.7 0.4 - 1.2 mg/dL Final  05/27/2008 04:50 AM 0.65  Final     PMHx:   Past Medical History:  Diagnosis Date  . Anemia, unspecified   . Arthritis   . Asthma   . Atrial fibrillation with RVR ([place])    a. on Xarelto  . Bell's palsy    Facial nerve decompression  in 2001  . CHF (congestive heart failure) ([place])   . Chronic low back pain   . COPD with asthma ([place])   . Coronary artery disease    Myoview 04/12/11 was entirely normal. ECHO 02/26/08 showed only minor abnormalities. Stenting 05/26/08 of her posterolateral branch to the left circumflex coronary artery. Used a 2.5x40mm Taxus Monorail stent.myoview 2014 was without ischemia  . Diabetes mellitus    Type 2  . Early cataracts, bilateral   . Fatty liver   . GERD (gastroesophageal reflux disease)   . Glaucoma   . Glaucoma   . Goiter   . Heart murmur   . History of nuclear stress test 2012; 2014   lexiscan; normal pattern of perfusion; normal, low risk scan   . Hyperlipidemia   . Hypertension   . Panic  disorder   . Pneumonia 2008  . Polycystic ovary disease    Hysterectomy in 1982 for this  . Shortness of breath dyspnea    ECHO 02/26/08 showed only minor abnormalities  . Spinal stenosis     Past Surgical History:  Procedure Laterality Date  . ABDOMINAL HYSTERECTOMY  1982   & BSO; for polycystic ovary disease  . CARDIOVERSION N/A 12/17/2013   Procedure: CARDIOVERSION;  Surgeon: Pixie Casino, MD;  Location: [facility] ENDOSCOPY;  Service: Cardiovascular;  Laterality: N/A;  . CARDIOVERSION N/A 09/30/2015   Procedure: CARDIOVERSION;  Surgeon: Dorothy Spark, MD;  Location[facility] ENDOSCOPY;  Service: Cardiovascular;  Laterality: N/A;  . CARDIOVERSION N/A 06/22/2016   Procedure: CARDIOVERSION;  Surgeon: Skeet Latch, MD;  Location: [facility];  Service: Cardiovascular;  Laterality: N/A;  . COLONOSCOPY     last 2009; Dr Cristina Gong; due 2019  . COLONOSCOPY WITH PROPOFOL N/A 01/23/2018   Procedure: COLONOSCOPY WITH PROPOFOL;  Surgeon: Ronald Lobo, MD;  Location: [facility];  Service: Endoscopy;  Laterality: N/A;  . CORONARY ANGIOPLASTY  05/26/2008   Stenting of her posterolateral branch to the left circumflex coronary artery. Used a 2.5x46mm Taxus Monorail stent.  . ESOPHAGOGASTRODUODENOSCOPY (EGD) WITH PROPOFOL N/A 01/23/2018   Procedure: ESOPHAGOGASTRODUODENOSCOPY (EGD) WITH PROPOFOL;  Surgeon: Ronald Lobo, MD;  Location: [facility];  Service: Endoscopy;  Laterality: N/A;  . FACIAL NERVE DECOMPRESSION  2001/2002   bells palsy   . LAPAROSCOPIC CHOLECYSTECTOMY  06/15/2011    Dr Dalbert Batman  . TEE WITHOUT CARDIOVERSION N/A 12/17/2013   Procedure: TRANSESOPHAGEAL ECHOCARDIOGRAM (TEE);  Surgeon: Pixie Casino, MD;  Location[facility] ENDOSCOPY;  Service: Cardiovascular;  Laterality: N/A;  trish/ja  . TRANSTHORACIC ECHOCARDIOGRAM  04/10/9370   LV systolic function normal with mild conc LVH; LA mildly dilated; trace MR/TR  . UPPER GI ENDOSCOPY  2009   negative    Family Hx:  Family History   Problem Relation Age of Onset  . Heart attack Father 66       2nd MI at 51  . Colon cancer Brother 48  . Gout Brother   . Ulcers Mother   . Emphysema Mother 40  . Colon polyps Sister   . Cancer Sister        Basal cell carcinoma  . Pneumonia Maternal Grandmother   . Hypertension Brother   . Hyperlipidemia Brother   . Cancer Brother        Skin  . Diabetes Neg Hx   . Stroke Neg Hx     Social History:  reports that she quit smoking about 25 years ago. Her smoking use included cigarettes. She has a 45.00 pack-year smoking history. She  has never used smokeless tobacco. She reports that she does not drink alcohol or use drugs.  Allergies:  Allergies  Allergen Reactions  . Benadryl [Diphenhydramine Hcl] Other (See Comments)    Restless leg  . Clopidogrel Bisulfate Nausea Only    Nausea & pain  . Contrast Media [Iodinated Diagnostic Agents] Palpitations    Rapid heart rate, hot    Medications: Prior to Admission medications   Medication Sig Start Date End Date Taking? Authorizing Provider  ascorbic acid (VITAMIN C) 1000 MG tablet Take 1,000 mg by mouth 2 (two) times daily.    Yes [provider]  CALCIUM-MAG-VIT C-VIT D PO Take 1 tablet by mouth daily.    Yes [provider]  celecoxib (CELEBREX) 200 MG capsule Take 1 capsule (200 mg total) by mouth 2 (two) times daily. 01/02/19  Yes Hoyt Koch, MD  diltiazem (CARDIZEM CD) 180 MG 24 hr capsule TAKE 1 CAPSULE DAILY. Patient taking differently: Take 180 mg by mouth daily.  12/15/18  Yes Lorretta Harp, MD  gabapentin (NEURONTIN) 100 MG capsule TAKE (1) CAPSULE THREE TIMES DAILY. Patient taking differently: Take 100 mg by mouth 3 (three) times daily.  01/06/19  Yes Hoyt Koch, MD  guaiFENesin (MUCINEX) 600 MG 12 hr tablet Take 600 mg by mouth 2 (two) times daily as needed for cough.    Yes [provider]  HYDROcodone-acetaminophen (NORCO/VICODIN) 5-325 MG tablet Take 1 tablet by  mouth at bedtime. 01/02/19  Yes Hoyt Koch, MD  INVOKANA 100 MG TABS tablet TAKE 1 TABLET ONCE DAILY. Patient taking differently: Take 100 mg by mouth daily before breakfast.  09/22/18  Yes Hoyt Koch, MD  isosorbide mononitrate (IMDUR) 30 MG 24 hr tablet Take 0.5 tablets (15 mg total) by mouth daily. 01/06/19  Yes Lorretta Harp, MD  latanoprost (XALATAN) 0.005 % ophthalmic solution Place 1 drop into both eyes at bedtime. 12/09/13  Yes [provider]  LORazepam (ATIVAN) 0.5 MG tablet TAKE 1 OR 2 TABLETS AT BEDTIME. Patient taking differently: Take 0.5-1 mg by mouth at bedtime.  10/14/18  Yes Hoyt Koch, MD  Melatonin 5 MG TABS Take 2-3 tablets by mouth at bedtime.    Yes [provider]  metoprolol succinate (TOPROL-XL) 25 MG 24 hr tablet TAKE ONE TABLET AT BEDTIME. Patient taking differently: Take 25 mg by mouth as needed. Take as needed for palpitations 12/02/18  Yes Sherran Needs, NP  metoprolol succinate (TOPROL-XL) 50 MG 24 hr tablet Take 100 mg by mouth daily.  12/14/18  Yes [provider]  Multiple Vitamins-Iron (MULTIVITAMINS WITH IRON) TABS Take 1 tablet by mouth daily.    Yes [provider]  nystatin ointment (MYCOSTATIN) Apply 1 application topically 2 (two) times daily as needed (IRRITATION).  07/09/16  Yes [provider]  omeprazole (PRILOSEC) 20 MG capsule Take 1 capsule (20 mg total) by mouth 2 (two) times daily before a meal. 12/31/18  Yes Vann, Jessica U, DO  PARoxetine (PAXIL) 10 MG tablet TAKE 2 AND 1/2 TABS DAILY Patient taking differently: Take 25 mg by mouth daily.  10/13/18  Yes Hoyt Koch, MD  quinapril (ACCUPRIL) 40 MG tablet TAKE 1 TABLET ONCE DAILY. Patient taking differently: Take 40 mg by mouth at bedtime.  09/18/18  Yes Hoyt Koch, MD  rOPINIRole (REQUIP XL) 2 MG 24 hr tablet TAKE ONE TABLET AT BEDTIME. Patient taking differently: Take 2 mg by mouth at bedtime.  07/24/18  Yes Dohmeier, Asencion Partridge, MD  rOPINIRole (REQUIP) 0.5 MG tablet Take 1 to 2 tab at midnight. ( XL form at 6 PM) Patient taking differently: Take 0.5-1 mg by mouth daily as needed (restless legs).  06/30/18  Yes Dohmeier, Asencion Partridge, MD  rosuvastatin (CRESTOR) 10 MG tablet TAKE ONE TABLET AT BEDTIME. Patient taking differently: Take 10 mg by mouth daily.  10/21/18  Yes Lorretta Harp, MD  senna (SENOKOT) 8.6 MG TABS Take 1 tablet by mouth at bedtime.    Yes [provider]  sitaGLIPtin-metformin (JANUMET) 50-1000 MG tablet TAKE 1/2 TABLET IN THE MORNING WITH BREAKFAST AND 1 TABLET IN THE EVENING WITH SUPPER. Patient taking differently: Take 0.5-1 tablets by mouth See admin instructions. TAKE 1/2 TABLET IN THE MORNING WITH BREAKFAST AND 1 TABLET IN THE EVENING WITH SUPPER. 11/17/18  Yes Hoyt Koch, MD  Tiotropium Bromide-Olodaterol (STIOLTO RESPIMAT) 2.5-2.5 MCG/ACT AERS Inhale 2 puffs into the lungs daily. 12/01/18  Yes Young, Tarri Fuller D, MD  torsemide (DEMADEX) 20 MG tablet Take 2 tablets (40 mg total) by mouth 2 (two) times daily. 01/06/19  Yes Lorretta Harp, MD  XARELTO 20 MG TABS tablet TAKE 1 TABLET ONCE DAILY WITH SUPPER. Patient taking differently: Take 20 mg by mouth daily with supper.  11/25/18  Yes Lorretta Harp, MD  Continuous Blood Gluc Sensor ([place]) MISC Use to monitor sugars 04/23/17   Hoyt Koch, MD  [facility] DELICA LANCETS FINE MISC 1 Units by Does not apply route as directed. 10/05/11   Midge Minium, MD    I have reviewed the patient's current medications.  Labs:  Results for orders placed or performed during the hospital encounter of 01/11/19 (from the past 48 hour(s))  Comprehensive metabolic panel     Status: Abnormal   Collection Time: 01/11/19  4:08 PM  Result Value Ref Range   Sodium 120 (L) 135 - 145 mmol/L   Potassium 4.5 3.5 - 5.1 mmol/L   Chloride 80 (L) 98 - 111 mmol/L   CO2 21 (L) 22 - 32 mmol/L   Glucose,  Bld 217 (H) 70 - 99 mg/dL   BUN 33 (H) 8 - 23 mg/dL   Creatinine, Ser 1.44 (H) 0.44 - 1.00 mg/dL   Calcium 9.4 8.9 - 10.3 mg/dL   Total Protein 6.8 6.5 - 8.1 g/dL   Albumin 3.1 (L) 3.5 - 5.0 g/dL   AST 22 15 - 41 U/L   ALT 16 0 - 44 U/L   Alkaline Phosphatase 76 38 - 126 U/L   Total Bilirubin 0.7 0.3 - 1.2 mg/dL   GFR calc non Af Amer 37 (L) >60 mL/min   GFR calc Af Amer 43 (L) >60 mL/min   Anion gap 19 (H) 5 - 15    Comment: Performed at [facility], [address], [place], [place] [address]  Troponin I - Once     Status: None   Collection Time: 01/11/19  4:08 PM  Result Value Ref Range   Troponin I <0.03 <0.03 ng/mL    Comment: Performed at [facility] [address], [place], [place] [address]  CBC with Differential     Status: Abnormal   Collection Time: 01/11/19  4:08 PM  Result Value Ref Range   WBC 12.4 (H) 4.0 - 10.5 K/uL   RBC 3.64 (L) 3.87 - 5.11 MIL/uL   Hemoglobin 8.7 (L) 12.0 - 15.0 g/dL    Comment: Reticulocyte Hemoglobin testing may  be clinically indicated, consider ordering this additional test [id number]    HCT 27.5 (L) 36.0 - 46.0 %   MCV 75.5 (L) 80.0 - 100.0 fL   MCH 23.9 (L) 26.0 - 34.0 pg   MCHC 31.6 30.0 - 36.0 g/dL   RDW 19.9 (H) 11.5 - 15.5 %   Platelets 384 150 - 400 K/uL   nRBC 0.0 0.0 - 0.2 %   Neutrophils Relative % 87 %   Neutro Abs 10.8 (H) 1.7 - 7.7 K/uL   Lymphocytes Relative 4 %   Lymphs Abs 0.5 (L) 0.7 - 4.0 K/uL   Monocytes Relative 8 %   Monocytes Absolute 0.9 0.1 - 1.0 K/uL   Eosinophils Relative 0 %   Eosinophils Absolute 0.0 0.0 - 0.5 K/uL   Basophils Relative 0 %   Basophils Absolute 0.0 0.0 - 0.1 K/uL   Immature Granulocytes 1 %   Abs Immature Granulocytes 0.08 (H) 0.00 - 0.07 K/uL    Comment: Performed at [facility], [address], [place], [place] [address]  Brain natriuretic peptide     Status: Abnormal   Collection Time: 01/11/19  4:18 PM  Result Value Ref Range   B Natriuretic Peptide 132.1 (H)  0.0 - 100.0 pg/mL    Comment: Performed at [facility] [address], [place],  [address]  SARS Coronavirus 2 (CEPHEID- Performed in [facility] hospital lab), Hosp Order     Status: None   Collection Time: 01/11/19  4:18 PM  Result Value Ref Range   SARS Coronavirus 2 NEGATIVE NEGATIVE    Comment: (NOTE) If result is NEGATIVE SARS-CoV-2 target nucleic acids are NOT DETECTED. The SARS-CoV-2 RNA is generally detectable in upper and lower  respiratory specimens during the acute phase of infection. The lowest  concentration of SARS-CoV-2 viral copies this assay can detect is 250  copies / mL. A negative result does not preclude SARS-CoV-2 infection  and should not be used as the sole basis for treatment or other  patient management decisions.  A negative result may occur with  improper specimen collection / handling, submission of specimen other  than nasopharyngeal swab, presence of viral mutation(s) within the  areas targeted by this assay, and inadequate number of viral copies  (<250 copies / mL). A negative result must be combined with clinical  observations, patient history, and epidemiological information. If result is POSITIVE SARS-CoV-2 target nucleic acids are DETECTED. The SARS-CoV-2 RNA is generally detectable in upper and lower  respiratory specimens dur ing the acute phase of infection.  Positive  results are indicative of active infection with SARS-CoV-2.  Clinical  correlation with patient history and other diagnostic information is  necessary to determine patient infection status.  Positive results do  not rule out bacterial infection or co-infection with other viruses. If result is PRESUMPTIVE POSTIVE SARS-CoV-2 nucleic acids MAY BE PRESENT.   A presumptive positive result was obtained on the submitted specimen  and confirmed on repeat testing.  While 2019 novel coronavirus  (SARS-CoV-2) nucleic acids may be present in the submitted sample  additional  confirmatory testing may be necessary for epidemiological  and / or clinical management purposes  to differentiate between  SARS-CoV-2 and other Sarbecovirus currently known to infect humans.  If clinically indicated additional testing with an alternate test  methodology [id number]) is advised. The SARS-CoV-2 RNA is generally  detectable in upper and lower respiratory sp ecimens during the acute  phase of infection. The expected  result is Negative. Fact Sheet for Patients:  StrictlyIdeas.no Fact Sheet for Healthcare Providers: BankingDealers.co.za This test is not yet approved or cleared by the [place] FDA and has been authorized for detection and/or diagnosis of SARS-CoV-2 by FDA under an Emergency Use Authorization (EUA).  This EUA will remain in effect (meaning this test can be used) for the duration of the COVID-19 declaration under Section 564(b)(1) of the Act, 21 U.S.C. section 360bbb-3(b)(1), unless the authorization is terminated or revoked sooner. Performed at [facility], [place] [address] [place], [place] [address]   Glucose, capillary     Status: Abnormal   Collection Time: 01/11/19 10:16 PM  Result Value Ref Range   Glucose-Capillary 201 (H) 70 - 99 mg/dL  Troponin I - Now Then Q6H     Status: None   Collection Time: 01/11/19 11:01 PM  Result Value Ref Range   Troponin I <0.03 <0.03 ng/mL    Comment: Performed at [facility] [address], [place], [place] [address]  Basic metabolic panel     Status: Abnormal   Collection Time: 01/12/19  2:54 AM  Result Value Ref Range   Sodium 117 (LL) 135 - 145 mmol/L    Comment: CRITICAL RESULT CALLED TO, READ BACK BY AND VERIFIED WITH: Tomi Likens [id number] WILDERK    Potassium 5.6 (H) 3.5 - 5.1 mmol/L    Comment: NO VISIBLE HEMOLYSIS   Chloride 82 (L) 98 - 111 mmol/L   CO2 20 (L) 22 - 32 mmol/L   Glucose, Bld 207 (H) 70 - 99 mg/dL   BUN 39 (H) 8 - 23  mg/dL   Creatinine, Ser 2.22 (H) 0.44 - 1.00 mg/dL   Calcium 8.9 8.9 - 10.3 mg/dL   GFR calc non Af Amer 22 (L) >60 mL/min   GFR calc Af Amer 25 (L) >60 mL/min   Anion gap 15 5 - 15    Comment: Performed at [facility], [place] [address], [place], [place] [address]  Troponin I - Now Then Q6H     Status: None   Collection Time: 01/12/19  2:54 AM  Result Value Ref Range   Troponin I <0.03 <0.03 ng/mL    Comment: Performed at [facility] [address], [place], [place] [address]  Glucose, capillary     Status: Abnormal   Collection Time: 01/12/19  6:17 AM  Result Value Ref Range   Glucose-Capillary 166 (H) 70 - 99 mg/dL  Troponin I - Now Then Q6H     Status: None   Collection Time: 01/12/19  8:33 AM  Result Value Ref Range   Troponin I <0.03 <0.03 ng/mL    Comment: Performed at [facility] [address], [place], [place] [address]  Osmolality     Status: Abnormal   Collection Time: 01/12/19  8:33 AM  Result Value Ref Range   Osmolality 262 (L) 275 - 295 mOsm/kg    Comment: Performed at [facility], [place] [address] [place], [place] [address]  Glucose, capillary     Status: Abnormal   Collection Time: 01/12/19 12:18 PM  Result Value Ref Range   Glucose-Capillary 171 (H) 70 - 99 mg/dL  Osmolality, urine     Status: Abnormal   Collection Time: 01/12/19  2:19 PM  Result Value Ref Range   Osmolality, Ur 258 (L) 300 - 900 mOsm/kg    Comment: Performed at [facility], [place]  [address], [place], [place] [address]  Sodium, urine, random     Status: None   Collection Time: 01/12/19  2:20 PM  Result Value Ref Range   Sodium, Ur 23 mmol/L    Comment: Performed at [facility] [address] [place], [place] [address]  CBC     Status: Abnormal   Collection Time: 01/12/19  2:58 PM  Result Value Ref Range   WBC 16.7 (H) 4.0 - 10.5 K/uL   RBC 3.74 (L) 3.87 - 5.11 MIL/uL   Hemoglobin 9.2 (L) 12.0 - 15.0 g/dL   HCT 27.8 (L) 36.0 - 46.0 %   MCV 74.3  (L) 80.0 - 100.0 fL   MCH 24.6 (L) 26.0 - 34.0 pg   MCHC 33.1 30.0 - 36.0 g/dL   RDW 19.8 (H) 11.5 - 15.5 %   Platelets 511 (H) 150 - 400 K/uL   nRBC 0.0 0.0 - 0.2 %    Comment: Performed at [facility] [address], [place], [place] [address]  Glucose, capillary     Status: Abnormal   Collection Time: 01/12/19  4:14 PM  Result Value Ref Range   Glucose-Capillary 155 (H) 70 - 99 mg/dL  Occult blood card to lab, stool RN will collect     Status: None   Collection Time: 01/12/19  6:25 PM  Result Value Ref Range   Fecal Occult Bld NEGATIVE NEGATIVE    Comment: Performed at [facility], [place] [address], [place], [place] [address]  CBC     Status: Abnormal   Collection Time: 01/12/19  8:29 PM  Result Value Ref Range   WBC 14.5 (H) 4.0 - 10.5 K/uL   RBC 3.71 (L) 3.87 - 5.11 MIL/uL   Hemoglobin 9.0 (L) 12.0 - 15.0 g/dL   HCT 27.3 (L) 36.0 - 46.0 %   MCV 73.6 (L) 80.0 - 100.0 fL   MCH 24.3 (L) 26.0 - 34.0 pg   MCHC 33.0 30.0 - 36.0 g/dL   RDW 19.8 (H) 11.5 - 15.5 %   Platelets 485 (H) 150 - 400 K/uL   nRBC 0.0 0.0 - 0.2 %    Comment: Performed at [facility] [address], [place], [place] [address]  Glucose, capillary     Status: Abnormal   Collection Time: 01/12/19  9:45 PM  Result Value Ref Range   Glucose-Capillary 171 (H) 70 - 99 mg/dL   Comment 1 Notify RN    Comment 2 Document in Chart   Basic metabolic panel     Status: Abnormal   Collection Time: 01/13/19  4:57 AM  Result Value Ref Range   Sodium 114 (LL) 135 - 145 mmol/L    Comment: CRITICAL RESULT CALLED TO, READ BACK BY AND VERIFIED WITH: HODGES,A RN 01/13/2019 0623 JORDANS    Potassium 5.9 (H) 3.5 - 5.1 mmol/L   Chloride 77 (L) 98 - 111 mmol/L   CO2 19 (L) 22 - 32 mmol/L   Glucose, Bld 177 (H) 70 - 99 mg/dL   BUN 57 (H) 8 - 23 mg/dL   Creatinine, Ser 3.57 (H) 0.44 - 1.00 mg/dL    Comment: DELTA CHECK NOTED   Calcium 9.1 8.9 - 10.3 mg/dL   GFR calc non Af Amer 12 (L) >60 mL/min   GFR  calc Af Amer 14 (L) >60 mL/min   Anion gap 18 (H) 5 - 15    Comment: Performed at [facility]  [address], [place], [place] [address]  CBC     Status: Abnormal   Collection Time: 01/13/19  4:57 AM  Result Value Ref Range   WBC 17.7 (H) 4.0 - 10.5 K/uL   RBC 3.66 (L) 3.87 - 5.11 MIL/uL   Hemoglobin 8.9 (L) 12.0 - 15.0 g/dL    Comment: Reticulocyte Hemoglobin testing may be clinically indicated, consider ordering this additional test [id number]    HCT 26.8 (L) 36.0 - 46.0 %   MCV 73.2 (L) 80.0 - 100.0 fL   MCH 24.3 (L) 26.0 - 34.0 pg   MCHC 33.2 30.0 - 36.0 g/dL   RDW 19.6 (H) 11.5 - 15.5 %   Platelets 493 (H) 150 - 400 K/uL   nRBC 0.0 0.0 - 0.2 %    Comment: Performed at [facility], [place] [address] [place], [place] [address]  Glucose, capillary     Status: Abnormal   Collection Time: 01/13/19  7:53 AM  Result Value Ref Range   Glucose-Capillary 155 (H) 70 - 99 mg/dL     ROS:  A comprehensive review of systems was negative except for: Respiratory: positive for sob- but is better also LE edema   Physical Exam: Vitals:   01/13/19 1130 01/13/19 1135  BP: 117/65   Pulse: (!) 0 (!) 0  Resp: (!) 8 (!) 0  Temp:    SpO2: (!) 0% (!) 0%     General: obese WF- alert and oriented- NAD-  Feels better after pericardiocentesis HEENT: PERRLA, EOMI Neck: positive for JVD Heart: tachy Lungs: mostly clear Abdomen: soft, non tender Extremities: pitting edema  Skin: warm and dry  Neuro: alert, non focal   Assessment/Plan: 68 year old WF with DM, HTN, diastolic CHF-  Admitted with SOB- found to have a pericardial effusion as well as AKI and worsening hyponatremia from baseline  1.Renal- AKI that is most likely hemodynamic in the setting of hemodynamically significant pericardial effusion causing hypotension being on an ACE.  Worsening quickly with minimal UOP and K this AM of 5.9.  Now that pericardial effusion has been drained hoping for acute improvement in  hemodynamics but will it be too little too late.  Sending stat labs now to look at sodium and K and place foley to look for any UOP.  She may need dialysis before this is all said and done- will know more after next labs 2.  Hyponatremia- appears somewhat chronic (has been 122-125 recently)  and likely related to heart failure- a hypervolemic hyponatremia.  Her urine osm is low appropriately so does not have SIADH.  Needs volume off to correct.  Is not seeming to be symptomatic from the sodium so would hold off on 3 % saline  3. Hypertension/volume  - was hypotensive and at home on an ACE- now better after pericardiocentesis 4. Anemia  - has been low around 9 but is stable 5. Pericardial effusion - unknown etiology- work up pending    Louis Meckel 01/13/2019, 12:02 PM

## 2019-01-14 ENCOUNTER — Inpatient Hospital Stay (HOSPITAL_COMMUNITY): Payer: PPO

## 2019-01-14 LAB — GLUCOSE, BODY FLUID OTHER: Glucose, Body Fluid Other: 25 mg/dL

## 2019-01-14 LAB — BASIC METABOLIC PANEL
Anion gap: 17 — ABNORMAL HIGH (ref 5–15)
Anion gap: 15 (ref 5–15)
Anion gap: 15 (ref 5–15)
Anion gap: 14 (ref 5–15)
BUN: 62 mg/dL — ABNORMAL HIGH (ref 8–23)
BUN: 58 mg/dL — ABNORMAL HIGH (ref 8–23)
BUN: 58 mg/dL — ABNORMAL HIGH (ref 8–23)
BUN: 55 mg/dL — ABNORMAL HIGH (ref 8–23)
CO2: 21 mmol/L — ABNORMAL LOW (ref 22–32)
CO2: 22 mmol/L (ref 22–32)
CO2: 24 mmol/L (ref 22–32)
CO2: 24 mmol/L (ref 22–32)
Calcium: 8.3 mg/dL — ABNORMAL LOW (ref 8.9–10.3)
Calcium: 8.6 mg/dL — ABNORMAL LOW (ref 8.9–10.3)
Calcium: 8.6 mg/dL — ABNORMAL LOW (ref 8.9–10.3)
Calcium: 8.7 mg/dL — ABNORMAL LOW (ref 8.9–10.3)
Chloride: 79 mmol/L — ABNORMAL LOW (ref 98–111)
Chloride: 81 mmol/L — ABNORMAL LOW (ref 98–111)
Chloride: 81 mmol/L — ABNORMAL LOW (ref 98–111)
Chloride: 83 mmol/L — ABNORMAL LOW (ref 98–111)
Creatinine, Ser: 3.06 mg/dL — ABNORMAL HIGH (ref 0.44–1.00)
Creatinine, Ser: 2.7 mg/dL — ABNORMAL HIGH (ref 0.44–1.00)
Creatinine, Ser: 2.56 mg/dL — ABNORMAL HIGH (ref 0.44–1.00)
Creatinine, Ser: 2.19 mg/dL — ABNORMAL HIGH (ref 0.44–1.00)
GFR calc Af Amer: 17 mL/min — ABNORMAL LOW (ref >60)
GFR calc Af Amer: 20 mL/min — ABNORMAL LOW (ref >60)
GFR calc Af Amer: 21 mL/min — ABNORMAL LOW (ref >60)
GFR calc Af Amer: 26 mL/min — ABNORMAL LOW (ref >60)
GFR calc non Af Amer: 15 mL/min — ABNORMAL LOW (ref >60)
GFR calc non Af Amer: 17 mL/min — ABNORMAL LOW (ref >60)
GFR calc non Af Amer: 18 mL/min — ABNORMAL LOW (ref >60)
GFR calc non Af Amer: 22 mL/min — ABNORMAL LOW (ref >60)
Glucose, Bld: 128 mg/dL — ABNORMAL HIGH (ref 70–99)
Glucose, Bld: 122 mg/dL — ABNORMAL HIGH (ref 70–99)
Glucose, Bld: 177 mg/dL — ABNORMAL HIGH (ref 70–99)
Glucose, Bld: 219 mg/dL — ABNORMAL HIGH (ref 70–99)
Potassium: 4.2 mmol/L (ref 3.5–5.1)
Potassium: 4.1 mmol/L (ref 3.5–5.1)
Potassium: 3.9 mmol/L (ref 3.5–5.1)
Potassium: 3.9 mmol/L (ref 3.5–5.1)
Sodium: 117 mmol/L — CL (ref 135–145)
Sodium: 118 mmol/L — CL (ref 135–145)
Sodium: 120 mmol/L — ABNORMAL LOW (ref 135–145)
Sodium: 121 mmol/L — ABNORMAL LOW (ref 135–145)

## 2019-01-14 LAB — C-REACTIVE PROTEIN: CRP: 9.6 mg/dL — ABNORMAL HIGH (ref <1.0)

## 2019-01-14 LAB — SODIUM: Sodium: 117 mmol/L — CL (ref 135–145)

## 2019-01-14 LAB — LD, BODY FLUID (OTHER): LD, Body Fluid: 1289 IU/L

## 2019-01-14 LAB — PH, BODY FLUID: pH, Body Fluid: 7

## 2019-01-14 LAB — CBC
HCT: 22.4 % — ABNORMAL LOW (ref 36.0–46.0)
Hemoglobin: 7.6 g/dL — ABNORMAL LOW (ref 12.0–15.0)
MCH: 24.7 pg — ABNORMAL LOW (ref 26.0–34.0)
MCHC: 33.9 g/dL (ref 30.0–36.0)
MCV: 72.7 fL — ABNORMAL LOW (ref 80.0–100.0)
Platelets: 347 10*3/uL (ref 150–400)
RBC: 3.08 MIL/uL — ABNORMAL LOW (ref 3.87–5.11)
RDW: 19.4 % — ABNORMAL HIGH (ref 11.5–15.5)
WBC: 14.9 10*3/uL — ABNORMAL HIGH (ref 4.0–10.5)
nRBC: 0 % (ref 0.0–0.2)

## 2019-01-14 LAB — GLUCOSE, CAPILLARY
Glucose-Capillary: 103 mg/dL — ABNORMAL HIGH (ref 70–99)
Glucose-Capillary: 152 mg/dL — ABNORMAL HIGH (ref 70–99)
Glucose-Capillary: 92 mg/dL (ref 70–99)
Glucose-Capillary: 185 mg/dL — ABNORMAL HIGH (ref 70–99)

## 2019-01-14 LAB — SEDIMENTATION RATE: Sed Rate: 35 mm/hr — ABNORMAL HIGH (ref 0–22)

## 2019-01-14 LAB — HEMOGLOBIN AND HEMATOCRIT, BLOOD
HCT: 23.6 % — ABNORMAL LOW (ref 36.0–46.0)
Hemoglobin: 7.8 g/dL — ABNORMAL LOW (ref 12.0–15.0)

## 2019-01-14 LAB — PROTEIN, BODY FLUID (OTHER): Total Protein, Body Fluid Other: 6.4 g/dL

## 2019-01-14 MED ORDER — ORAL CARE MOUTH RINSE
15.0000 mL | Freq: Two times a day (BID) | OROMUCOSAL | Status: DC
  Administered 2019-01-14 – 2019-01-16 (×5): 15 mL via OROMUCOSAL

## 2019-01-14 MED ORDER — ALBUTEROL SULFATE (2.5 MG/3ML) 0.083% IN NEBU: 2.5000 mg | INHALATION_SOLUTION | RESPIRATORY_TRACT | Status: DC | PRN

## 2019-01-14 MED ORDER — DILTIAZEM HCL 60 MG PO TABS
30.0000 mg | ORAL_TABLET | Freq: Four times a day (QID) | ORAL | Status: DC
  Administered 2019-01-14 – 2019-01-15 (×3): 30 mg via ORAL
  Filled 2019-01-14 (×3): qty 1

## 2019-01-14 MED ORDER — DILTIAZEM HCL 60 MG PO TABS
30.0000 mg | ORAL_TABLET | Freq: Once | ORAL | Status: AC
  Administered 2019-01-14: 30 mg via ORAL
  Filled 2019-01-14: qty 1

## 2019-01-14 NOTE — TOC Initial Note (Signed)
Transition of Care [facility]) - Initial/Assessment Note    Patient Details  Name: [name] MRN: [id number] Date of Birth: 1951/02/21  [facility]) CM/SW Contact:    Zenon Mayo, RN Phone Number: 01/14/2019, 4:09 PM  Clinical Narrative:                 From home with spouse and two daughters in their twenties.  , conts on iv lasix, remains volume overloaded. She was on xarelto pta.  NCM offered choice for HHPT/HHOT, she chose [facility], referral given to [facility] for HHPT/HHOT.  She states they can take referral. Soc will begin 24-48 hrs post dc. Patient states her spouse has a rolling walker at home so she does not need another one.    Expected Discharge Plan: [place] Barriers to Discharge: No Barriers Identified   Patient Goals and CMS Choice Patient states their goals for this hospitalization and ongoing recovery are:: to get back to work CMS Medicare.gov Compare Post Acute Care list provided to:: Patient Choice offered to / list presented to : Patient  Expected Discharge Plan and Services Expected Discharge Plan: [place]   Discharge Planning Services: CM Consult Post Acute Care Choice: [place] arrangements for the past 2 months: Single Family Home                 DME Arranged: N/A DME Agency: NA       HH Arranged: PT, OT [place] Agency: [facility] (formerly [employer]) Date [place]: 01/14/19 Time [place]: 1608 Representative spoke with at [place]: [place]  Prior Living Arrangements/Services Living arrangements for the past 2 months: [place] Lives with:: Spouse Patient language and need for interpreter reviewed:: Yes Do you feel safe going back to the place where you live?: Yes      Need for Family Participation in Patient Care: No (Comment) Care giver support system in place?: Yes (comment)   Criminal Activity/Legal Involvement Pertinent to Current  Situation/Hospitalization: No - Comment as needed  Activities of Daily Living Home Assistive Devices/Equipment: None ADL Screening (condition at time of admission) Patient's cognitive ability adequate to safely complete daily activities?: Yes Is the patient deaf or have difficulty hearing?: No Does the patient have difficulty seeing, even when wearing glasses/contacts?: No Does the patient have difficulty concentrating, remembering, or making decisions?: No Patient able to express need for assistance with ADLs?: Yes Does the patient have difficulty dressing or bathing?: No Independently performs ADLs?: Yes (appropriate for developmental age) Does the patient have difficulty walking or climbing stairs?: No Weakness of Legs: None Weakness of Arms/Hands: None  Permission Sought/Granted                  Emotional Assessment Appearance:: Appears stated age Attitude/Demeanor/Rapport: (appropriate) Affect (typically observed): Accepting Orientation: : Oriented to Self, Oriented to Place, Oriented to  Time, Oriented to Situation Alcohol / Substance Use: Not Applicable Psych Involvement: No (comment)  Admission diagnosis:  Shortness of breath [R06.02] Patient Active Problem List   Diagnosis Date Noted  . Acute renal failure ([place]) 01/13/2019  . BRBPR (bright red blood per rectum) 01/13/2019  . Cardiac tamponade   . Bradycardia 01/12/2019  . Pericardial effusion 01/12/2019  . Acute kidney injury ([place]) 01/12/2019  . Diarrhea 01/12/2019  . Shortness of breath   . CHF (congestive heart failure) ([place]) 01/11/2019  . Hyponatremia 12/30/2018  . Acute on chronic  diastolic CHF (congestive heart failure) ([place]) 12/30/2018  . Stasis dermatitis of both legs 11/27/2018  . Permanent atrial fibrillation 06/10/2018  . Arthritis 10/25/2017  . COPD mixed type ([place]) 10/25/2017  . Fibrocystic disease of breast 10/25/2017  . Gastroesophageal reflux disease 10/25/2017  . Glaucoma 10/25/2017  .  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 Heart murmur 10/25/2017  . Insomnia 10/25/2017  . Osteopenia 10/25/2017  . RLS (restless legs syndrome) 04/23/2017  . Routine general medical examination at a health care facility 11/16/2016  . Diabetic polyneuropathy ([place]) 07/30/2016  . Depression 01/04/2016  . Obesity (BMI 30.0-34.9) 10/11/2015  . CAD S/P percutaneous coronary angioplasty 04/28/2013  . Essential hypertension 05/06/2009  . LOW BACK PAIN, CHRONIC 02/12/2008  . Anemia 08/15/2007  . Type 2 diabetes mellitus with vascular disease ([place]) 01/02/2007  . Hyperlipidemia associated with type 2 diabetes mellitus ([place]) 01/02/2007  . ELEVATION, TRANSAMINASE/LDH LEVELS 01/02/2007   PCP:  Hoyt Koch, MD Pharmacy:   [facility], [address] [place] [address] Phone: [phone] Fax: [phone]     Social Determinants of Health (SDOH) Interventions    Readmission Risk Interventions Readmission Risk Prevention Plan 01/14/2019  Transportation Screening Complete  HRI or [place] Complete  Social Work Consult for [place] Planning/Counseling Complete  Palliative Care Screening Not Applicable  Medication Review Press photographer) Complete  Some recent data might be hidden

## 2019-01-14 NOTE — Progress Notes (Signed)
Advanced Heart Failure Rounding Note   Subjective:    Underwent pericardial tap on 6/2. Almost 1L of bloody fluid off. Feels much better. Hemodynamics improved but BP still soft.   Pericardial tube remains in place. Minimal drainage overnight.   Urine output much improved with 4.5L out with IV lasix and 2 doses tolvaptan   Objective:   Weight Range:  Vital Signs:   Temp:  [97.7 F (36.5 C)-98.1 F (36.7 C)] 97.7 F (36.5 C) (06/03 0657) Pulse Rate:  [0-237] 101 (06/03 0700) Resp:  [0-40] 17 (06/03 0700) BP: (78-176)/(45-155) 105/65 (06/03 0700) SpO2:  [0 %-100 %] 99 % (06/03 0700) Weight:  [90.8 kg] 90.8 kg (06/03 0600) Last BM Date: 01/13/19  Weight change: Filed Weights   01/12/19 0457 01/13/19 0621 01/14/19 0600  Weight: 92.2 kg 92.9 kg 90.8 kg    Intake/Output:   Intake/Output Summary (Last 24 hours) at 01/14/2019 0802 Last data filed at 01/14/2019 0700 Gross per 24 hour  Intake 976.67 ml  Output 4500 ml  Net -3523.33 ml     Physical Exam: General:  Sitting up in bed. No resp difficulty HEENT: normal Neck: supple. JVP hard to see. LIJ TLC. Carotids 2+ bilat; no bruits. No lymphadenopathy or thryomegaly appreciated. Cor: PMI nondisplaced. Iregular rate & rhythm. No rubs, gallops or murmurs. Pericardial drain site ok  Lungs: clear Abdomen: obese soft, nontender, nondistended. No hepatosplenomegaly. No bruits or masses. Good bowel sounds. Extremities: no cyanosis, clubbing, rash, edema Neuro: alert & orientedx3, cranial nerves grossly intact. moves all 4 extremities w/o difficulty. Affect pleasant   Telemetry: AF 90-100 Personally reviewed   Labs: Basic Metabolic Panel: Recent Labs  Lab 01/12/19 0254 01/13/19 0457  01/13/19 1019 01/13/19 1052 01/13/19 1305 01/13/19 2030 01/14/19 0200 01/14/19 0225  NA 117* 114*   < > 116* 121* 115* 116* 117* 117*  K 5.6* 5.9*   < > 5.6* 4.8 5.4* 4.6  --  4.2  CL 82* 77*  --   --   --  78* 78*  --  79*  CO2  20* 19*  --   --   --  21* 21*  --  21*  GLUCOSE 207* 177*  --   --   --  135* 137*  --  128*  BUN 39* 57*  --   --   --  62* 62*  --  62*  CREATININE 2.22* 3.57*  --   --   --  3.74* 3.48*  --  3.06*  CALCIUM 8.9 9.1  --   --   --  8.7* 8.7*  --  8.3*   < > = values in this interval not displayed.    Liver Function Tests: Recent Labs  Lab 01/11/19 1608  AST 22  ALT 16  ALKPHOS 76  BILITOT 0.7  PROT 6.8  ALBUMIN 3.1*   No results for input(s): LIPASE, AMYLASE in the last 168 hours. No results for input(s): AMMONIA in the last 168 hours.  CBC: Recent Labs  Lab 01/11/19 1608 01/12/19 1458 01/12/19 2029 01/13/19 0457 01/13/19 1001 01/13/19 1014 01/13/19 1019 01/13/19 1052 01/14/19 0225  WBC 12.4* 16.7* 14.5* 17.7*  --   --   --   --  14.9*  NEUTROABS 10.8*  --   --   --   --   --   --   --   --   HGB 8.7* 9.2* 9.0* 8.9* 10.5* 9.5* 9.9* 8.2* 7.6*  HCT 27.5* 27.8*  27.3* 26.8* 31.0* 28.0* 29.0* 24.0* 22.4*  MCV 75.5* 74.3* 73.6* 73.2*  --   --   --   --  72.7*  PLT 384 511* 485* 493*  --   --   --   --  347    Cardiac Enzymes: Recent Labs  Lab 01/11/19 1608 01/11/19 2301 01/12/19 0254 01/12/19 0833  TROPONINI <0.03 <0.03 <0.03 <0.03    BNP: BNP (last 3 results) Recent Labs    12/29/18 2152 01/11/19 1618  BNP 277.4* 132.1*    ProBNP (last 3 results) Recent Labs    11/27/18 1547  PROBNP 219.0*      Other results:  Imaging:  No results found.   Medications:     Scheduled Medications: . Chlorhexidine Gluconate Cloth  6 each Topical Daily  . gabapentin  300 mg Oral QHS  . HYDROcodone-acetaminophen  1 tablet Oral QHS  . insulin aspart  0-5 Units Subcutaneous QHS  . insulin aspart  0-9 Units Subcutaneous TID WC  . latanoprost  1 drop Both Eyes QHS  . mouth rinse  15 mL Mouth Rinse BID  . Melatonin  9-15 mg Oral QHS  . multivitamins with iron  1 tablet Oral Daily  . pantoprazole (PROTONIX) IV  40 mg Intravenous Q12H  . rOPINIRole  2 mg Oral  QHS  . senna  1 tablet Oral QHS  . sodium chloride flush  3 mL Intravenous Q12H  . ascorbic acid  1,000 mg Oral BID     Infusions: . sodium chloride    . cefTRIAXone (ROCEPHIN)  IV 1 g (01/14/19 0032)     PRN Medications:  sodium chloride, acetaminophen, albuterol, guaiFENesin, LORazepam, ondansetron (ZOFRAN) IV, rOPINIRole, sodium chloride flush   Assessment/Plan:   1. Cardiogenic shock in setting of pericardial tamponade - echo  6/1 with EF 55-60% - s/p pericardiocentesis 6/2 - hemodynamics much improved - remains volume overloaded. Diuresing well. Will continue lasix and daily tolvaptan  2. Large pericardial effusion suspect pericardial tamponade - s/p pericardiocentesis 6/2. With ~1L bloody fluid off - hemodynamics much improved - fluid analysis with 6,556 WBCs (85% polys). GS - No organisms. Cytology pending. ? Viral in nature - Will check ESR, autoimmune labs, CT C/A/P rule out malignancy - Pull drain when drainage < 50cc/hr - possibly later today  3. AKI - improving post tap - baseline creatinine 0.8 ->0> 3.74 -> 3.06 - suspect ATN due to hypotension - Remains volume overloaded with CVP 15 Renal managiing.  4. Hyponatremia - somewhat chronic. Hypervolemic. No evidence SIADH - Renal on board and managing  - Limit free water  5. Chronic AF - rate controlled. Off AC for now with bloody pericardial effusion  6. Morbid obesity - will need weight loss  CRITICAL CARE Performed by: Glori Bickers  Total critical care time: 35 minutes  Critical care time was exclusive of separately billable procedures and treating other patients.  Critical care was necessary to treat or prevent imminent or life-threatening deterioration.  Critical care was time spent personally by me (independent of midlevel providers or residents) on the following activities: development of treatment plan with patient and/or surrogate as well as nursing, discussions with consultants,  evaluation of patient's response to treatment, examination of patient, obtaining history from patient or surrogate, ordering and performing treatments and interventions, ordering and review of laboratory studies, ordering and review of radiographic studies, pulse oximetry and re-evaluation of patient's condition.    Length of Stay: 2   Glori Bickers MD 01/14/2019,  8:02 AM  Advanced Heart Failure Team Pager [phone] (M-F; 7a - 4p)  Please contact [facility] for night-coverage after hours (4p -7a ) and weekends on [email or web address]

## 2019-01-14 NOTE — Progress Notes (Signed)
CRITICAL VALUE ALERT  Critical Value: Sodium  Date & Time Notied:  01/13/19 2110  Provider Notified: Foster,MD  Orders Received/Actions taken: Redraw Sodium labs at 0200

## 2019-01-14 NOTE — Progress Notes (Signed)
Subjective:  Remains in ICU- BP is soft-  Had 4500 of UOP- given lasix 160 as well as 2 doses of samsca over 6 hours, total of 45 mg.  Sodium up from 114 to 117  Objective Vital signs in last 24 hours: Vitals:   01/14/19 0430 01/14/19 0530 01/14/19 0600 01/14/19 0657  BP: 126/71 (!) 108/45 107/66   Pulse: 94 (!) 107 (!) 111   Resp: 14 11 11    Temp:    97.7 F (36.5 C)  TempSrc:    Oral  SpO2: 91% 96% 95%   Weight:   90.8 kg   Height:       Weight change: -2.097 kg  Intake/Output Summary (Last 24 hours) at 01/14/2019 0738 Last data filed at 01/14/2019 0700 Gross per 24 hour  Intake 976.67 ml  Output 4500 ml  Net -3523.33 ml    Assessment/Plan: 68 year old WF with DM, HTN, diastolic CHF-  Admitted with SOB- found to have a pericardial effusion as well as AKI and worsening hyponatremia from baseline  1.Renal- AKI that is most likely hemodynamic in the setting of hemodynamically significant pericardial effusion causing hypotension and being on an ACE.  Worsened quickly with minimal UOP and high K.  Now that pericardial effusion was drained on 6/2  seems that acute improvement in hemodynamics did lead to improvement.  No dialysis indications at present and hoping it will stay that way 2.  Hyponatremia- appears somewhat chronic (has been 122-125 recently in April ) and likely related to heart failure- a hypervolemic hyponatremia.  Her urine osm is low appropriately so does not have SIADH.  Needs volume off to correct.  Is not seeming to be symptomatic from the sodium - is trending better.  With aATN resolving she may auto correct to a degree.  Do not feel need to aggressively continue with samsca- would give it at least 24 hours from last dose and reasess- again baseline sodium recently is in the low 120's 3. Hypertension/volume  - was hypotensive and at home on an ACE- now better after pericardiocentesis- now BP is soft  4. Anemia  - had been low around 9 - dropped acutely after procedure -  supportive care for now  5. Pericardial effusion - unknown etiology- work up pending    [place]: Basic Metabolic Panel: Recent Labs  Lab 01/13/19 1305 01/13/19 2030 01/14/19 0200 01/14/19 0225  NA 115* 116* 117* 117*  K 5.4* 4.6  --  4.2  CL 78* 78*  --  79*  CO2 21* 21*  --  21*  GLUCOSE 135* 137*  --  128*  BUN 62* 62*  --  62*  CREATININE 3.74* 3.48*  --  3.06*  CALCIUM 8.7* 8.7*  --  8.3*   Liver Function Tests: Recent Labs  Lab 01/11/19 1608  AST 22  ALT 16  ALKPHOS 76  BILITOT 0.7  PROT 6.8  ALBUMIN 3.1*   No results for input(s): LIPASE, AMYLASE in the last 168 hours. No results for input(s): AMMONIA in the last 168 hours. CBC: Recent Labs  Lab 01/11/19 1608 01/12/19 1458 01/12/19 2029 01/13/19 0457  01/13/19 1019 01/13/19 1052 01/14/19 0225  WBC 12.4* 16.7* 14.5* 17.7*  --   --   --  14.9*  NEUTROABS 10.8*  --   --   --   --   --   --   --   HGB 8.7* 9.2* 9.0* 8.9*   < >  9.9* 8.2* 7.6*  HCT 27.5* 27.8* 27.3* 26.8*   < > 29.0* 24.0* 22.4*  MCV 75.5* 74.3* 73.6* 73.2*  --   --   --  72.7*  PLT 384 511* 485* 493*  --   --   --  347   < > = values in this interval not displayed.   Cardiac Enzymes: Recent Labs  Lab 01/11/19 1608 01/11/19 2301 01/12/19 0254 01/12/19 0833  TROPONINI <0.03 <0.03 <0.03 <0.03   CBG: Recent Labs  Lab 01/13/19 0753 01/13/19 1305 01/13/19 1551 01/13/19 2237 01/14/19 0653  GLUCAP 155* 118* 127* 113* 103*    Iron Studies: No results for input(s): IRON, TIBC, TRANSFERRIN, FERRITIN in the last 72 hours. Studies/Results: No results found. Medications: Infusions: . sodium chloride    . cefTRIAXone (ROCEPHIN)  IV 1 g (01/14/19 0032)    Scheduled Medications: . Chlorhexidine Gluconate Cloth  6 each Topical Daily  . gabapentin  300 mg Oral QHS  . HYDROcodone-acetaminophen  1 tablet Oral QHS  . insulin aspart  0-5 Units Subcutaneous QHS  . insulin aspart  0-9 Units Subcutaneous TID WC  .  latanoprost  1 drop Both Eyes QHS  . mouth rinse  15 mL Mouth Rinse BID  . Melatonin  9-15 mg Oral QHS  . multivitamins with iron  1 tablet Oral Daily  . pantoprazole (PROTONIX) IV  40 mg Intravenous Q12H  . rOPINIRole  2 mg Oral QHS  . senna  1 tablet Oral QHS  . sodium chloride flush  3 mL Intravenous Q12H  . ascorbic acid  1,000 mg Oral BID    have reviewed scheduled and prn medications.  Physical Exam: General: NAD - sitting up in bedside chair eating breakfast  Heart: RRR Lungs: dec BS at bases  Abdomen: obese, soft  Extremities: pitting edema      01/14/2019,7:38 AM  LOS: 2 days

## 2019-01-14 NOTE — Progress Notes (Signed)
CRITICAL VALUE ALERT  Critical Value: Sodium 117 Date & Time Notied:  01/14/19 0307 Provider Notified: Foster,MD Orders Received/Actions taken:

## 2019-01-14 NOTE — Progress Notes (Signed)
Verified w/ MD that it is okay to give PO contrast. CT scheduled for 1330.    1129 Pt refusing to drink contrast. CT notified; Still planning to take pt for imaging at 1330.

## 2019-01-14 NOTE — Progress Notes (Signed)
CRITICAL VALUE ALERT  Critical Value:  Sodium 118  Date & Time Notied:  01/14/2019 0851  Orders Received/Actions taken: Expected value. Continuing to trend labs.

## 2019-01-14 NOTE — Progress Notes (Signed)
PROGRESS NOTE    [name]  BDZ:[id number] DOB: 09-29-1950 DOA: 01/11/2019 PCP: Hoyt Koch, MD   Brief Narrative:  68 y.o. female with a Past Medical History of chronic hyponatremia, Dm2, HTN, who presents with volume overload/CHF exac, pericardial effusion, worsening Na, AKI, and BRBPR.  Assessment & Plan:   Principal Problem:   Acute on chronic diastolic CHF (congestive heart failure) (HCC) Active Problems:   Type 2 diabetes mellitus with vascular disease (HCC)   Anemia   Essential hypertension   CAD S/P percutaneous coronary angioplasty   COPD mixed type (HCC)   Permanent atrial fibrillation   Hyponatremia   CHF (congestive heart failure) (HCC)   Bradycardia   Pericardial effusion   Diarrhea   Shortness of breath   Acute renal failure (HCC)   BRBPR (bright red blood per rectum)   Cardiac tamponade  Cardiogenic Shock in setting of pericardial tamponade   Heart Failure Exacerbation:  Echo 6/1 with EF 55-60% (see report) S/p pericardiocentesis on 6/2 S/p RHC with evidence of pericardial tamponade and post tap with pulmonary venous HTN Diuresis per cardiology (s/p 160 mg lasix x1 on 6/2) Follow up w/u of effusion (RF, ANA, ANCA, CRP, ESR, HIV)  Acute Kidney Injury: baseline creatinine < 0.1.  Thought to be hemodynamically mediated in setting of pericardial effusion with tamponade. Peaked at 3.74  Now improving Follow I/O, daily weights Appreciate nephrology c/s Holding ACEi, BP meds at this time  Hyponatremia: Some chronicity to this.  Worsened to 113.  Now improving. S/p lasix and tolvaptan Urine sodium 6/1 23, urine osm 258   In setting of above, suspected due to hypervolemic hyponatremia Appreciate nephrology recs  Type 2 diabetes mellitus with vascular disease (HCC)serum glucose 217 on admission. A1C 6.5 2/20. Home meds include invokana, janumet. -sliding scale insulin -holding orals  Essential hypertension- hypotension has improved  after pericardiocentesis.   Holding beta-blocker and calcium channel blocker along with ACE inhibitor. - continue to monitor, resume as tolerated  Diarrhea   BRBPR. Concern for BRBPR, but now appears to be resolved.  FOBT was negative and Hb stable.    - She's been seen by Dr. Cristina Gong before in the past, will discuss additionally with pt and consider discussion with GI prior to resuming xarelto.  -hold xarelto -hold celebrex -PPI -follow cdiff and gi pathogen panel  Anemia related to chronic blood loss   Iron deficiency Anemia.  Continue to monitor Transfuse for Hb <7   CAD S/P percutaneous coronary angioplasty-stable  COPD mixed type (HCC)-compensated and stable at this time lung exam clear  Permanent atrial fibrillation-holding  Xarelto monitor rate on telemetry  Pyuria: will add on urine culture.  Not clear she has sx, will need to discuss additionally.     DVT prophylaxis: SCD Code Status: full  Family Communication: none at bedside Disposition Plan: pending   Consultants:   Nephrology  cardiology  Procedures:  Right and L H Cath, pericardiocentesis Findings:  Pre-pericardiocentesis  Ao = 120/59 (75) LV = 120/36 RA = 36 RV = 72/40 PA = 74/36 (108) PCW = 44 Fick cardiac output/index = 5.6/2.9 PVR = 2.2 WU FA sat = 100% PA sat = 56% +RV-LV interaction + equalization of diastolic pressures  Post-tap  RA = 18 RV = 75/15 PA = 76/24 (48) PCWP = 26 Fick 8.5/4.4 PVR = 2.5 WU  Assessment: 1. Evidence of pericardial tamponade  2. Post-tap has persistent pulmonary venous HTN  Will work-up etiology of effusion and  continue to manage HF medically   Pericardiocentesis 6/2  1. Cardiac tamponade 2. Successful pericardiocentesis with removal of 900 cc of bloody fluid  Recommendations: Will leave drain in place for at least 25 hours. Fluid studies to be sent to lab.   Echo 6/1  IMPRESSIONS    1. The left ventricle has normal systolic  function, with an ejection fraction of 55-60%. The cavity size was normal. Left ventricular diastolic function could not be evaluated secondary to atrial fibrillation. There is constrictive physiology.  2. The right ventricle has normal systolic function. The cavity was normal. There is no increase in right ventricular wall thickness.  3. Left atrial size was moderately dilated.  4. Right atrial size was moderately dilated.  5. Large pericardial effusion.  6. The pericardial effusion is circumferential.  7. There is a large circumfrential echolucent pericaradial effusion without frank evidence of tamponade.  8. Aortic valve regurgitation is trivial by color flow Doppler. No stenosis of the aortic valve.  9. The aortic root and ascending aorta are normal in size and structure. 10. The inferior vena cava was dilated in size with <50% respiratory variability. 11. The interatrial septum was not well visualized.   Antimicrobials: Anti-infectives (From admission, onward)   Start     Dose/Rate Route Frequency Ordered Stop   01/13/19 2300  cefTRIAXone (ROCEPHIN) 1 g in sodium chloride 0.9 % 100 mL IVPB     1 g 200 mL/hr over 30 Minutes Intravenous Every 24 hours 01/13/19 2226           Subjective: Feeling better. No complaints. No diarrhea in 2 days, no BM in 2 days.  Objective: Vitals:   01/14/19 1200 01/14/19 1259 01/14/19 1300 01/14/19 1503  BP: 112/62 128/71 128/71   Pulse: (!) 119  (!) 122   Resp: 15  13   Temp:    98.3 F (36.8 C)  TempSrc:    Oral  SpO2: 94%  92%   Weight:      Height:        Intake/Output Summary (Last 24 hours) at 01/14/2019 1529 Last data filed at 01/14/2019 1400 Gross per 24 hour  Intake 806.38 ml  Output 5420 ml  Net -4613.62 ml   Filed Weights   01/12/19 0457 01/13/19 0621 01/14/19 0600  Weight: 92.2 kg 92.9 kg 90.8 kg    Examination:  General exam: Appears calm and comfortable  Respiratory system: Clear to auscultation. Respiratory effort  normal. Cardiovascular system: S1 & S2 heard, irregularly irregular. Pericardiocentesis catheter in place.  Gastrointestinal system: Abdomen is nondistended, soft and nontender Central nervous system: Alert and oriented. No focal neurological deficits. Extremities: no lee Skin: No rashes, lesions or ulcers Psychiatry: Judgement and insight appear normal. Mood & affect appropriate.     Data Reviewed: I have personally reviewed following labs and imaging studies  CBC: Recent Labs  Lab 01/11/19 1608 01/12/19 1458 01/12/19 2029 01/13/19 0457  01/13/19 1014 01/13/19 1019 01/13/19 1052 01/14/19 0225 01/14/19 1102  WBC 12.4* 16.7* 14.5* 17.7*  --   --   --   --  14.9*  --   NEUTROABS 10.8*  --   --   --   --   --   --   --   --   --   HGB 8.7* 9.2* 9.0* 8.9*   < > 9.5* 9.9* 8.2* 7.6* 7.8*  HCT 27.5* 27.8* 27.3* 26.8*   < > 28.0* 29.0* 24.0* 22.4* 23.6*  MCV 75.5* 74.3* 73.6*  73.2*  --   --   --   --  72.7*  --   PLT 384 511* 485* 493*  --   --   --   --  347  --    < > = values in this interval not displayed.   Basic Metabolic Panel: Recent Labs  Lab 01/13/19 1305 01/13/19 2030 01/14/19 0200 01/14/19 0225 01/14/19 0743 01/14/19 1102  NA 115* 116* 117* 117* 118* 120*  K 5.4* 4.6  --  4.2 4.1 3.9  CL 78* 78*  --  79* 81* 81*  CO2 21* 21*  --  21* 22 24  GLUCOSE 135* 137*  --  128* 122* 177*  BUN 62* 62*  --  62* 58* 58*  CREATININE 3.74* 3.48*  --  3.06* 2.70* 2.56*  CALCIUM 8.7* 8.7*  --  8.3* 8.6* 8.6*   GFR: Estimated Creatinine Clearance: 21.4 mL/min (Chrystina Naff) (by C-G formula based on SCr of 2.56 mg/dL (H)). Liver Function Tests: Recent Labs  Lab 01/11/19 1608  AST 22  ALT 16  ALKPHOS 76  BILITOT 0.7  PROT 6.8  ALBUMIN 3.1*   No results for input(s): LIPASE, AMYLASE in the last 168 hours. No results for input(s): AMMONIA in the last 168 hours. Coagulation Profile: No results for input(s): INR, PROTIME in the last 168 hours. Cardiac Enzymes: Recent Labs  Lab  01/11/19 1608 01/11/19 2301 01/12/19 0254 01/12/19 0833  TROPONINI <0.03 <0.03 <0.03 <0.03   BNP (last 3 results) Recent Labs    11/27/18 1547  PROBNP 219.0*   HbA1C: No results for input(s): HGBA1C in the last 72 hours. CBG: Recent Labs  Lab 01/13/19 1551 01/13/19 2237 01/14/19 0653 01/14/19 1109 01/14/19 1505  GLUCAP 127* 113* 103* 152* 92   Lipid Profile: No results for input(s): CHOL, HDL, LDLCALC, TRIG, CHOLHDL, LDLDIRECT in the last 72 hours. Thyroid Function Tests: No results for input(s): TSH, T4TOTAL, FREET4, T3FREE, THYROIDAB in the last 72 hours. Anemia Panel: No results for input(s): VITAMINB12, FOLATE, FERRITIN, TIBC, IRON, RETICCTPCT in the last 72 hours. Sepsis Labs: No results for input(s): PROCALCITON, LATICACIDVEN in the last 168 hours.  Recent Results (from the past 240 hour(s))  SARS Coronavirus 2 (CEPHEID- Performed in [facility] hospital lab), Hosp Order     Status: None   Collection Time: 01/11/19  4:18 PM  Result Value Ref Range Status   SARS Coronavirus 2 NEGATIVE NEGATIVE Final    Comment: (NOTE) If result is NEGATIVE SARS-CoV-2 target nucleic acids are NOT DETECTED. The SARS-CoV-2 RNA is generally detectable in upper and lower  respiratory specimens during the acute phase of infection. The lowest  concentration of SARS-CoV-2 viral copies this assay can detect is 250  copies / mL. Savaya Hakes negative result does not preclude SARS-CoV-2 infection  and should not be used as the sole basis for treatment or other  patient management decisions.  Rene Gonsoulin negative result may occur with  improper specimen collection / handling, submission of specimen other  than nasopharyngeal swab, presence of viral mutation(s) within the  areas targeted by this assay, and inadequate number of viral copies  (<250 copies / mL). Ileane Sando negative result must be combined with clinical  observations, patient history, and epidemiological information. If result is POSITIVE SARS-CoV-2  target nucleic acids are DETECTED. The SARS-CoV-2 RNA is generally detectable in upper and lower  respiratory specimens dur ing the acute phase of infection.  Positive  results are indicative of active infection with SARS-CoV-2.  Clinical  correlation with patient history and other diagnostic information is  necessary to determine patient infection status.  Positive results do  not rule out bacterial infection or co-infection with other viruses. If result is PRESUMPTIVE POSTIVE SARS-CoV-2 nucleic acids MAY BE PRESENT.   A presumptive positive result was obtained on the submitted specimen  and confirmed on repeat testing.  While 2019 novel coronavirus  (SARS-CoV-2) nucleic acids may be present in the submitted sample  additional confirmatory testing may be necessary for epidemiological  and / or clinical management purposes  to differentiate between  SARS-CoV-2 and other Sarbecovirus currently known to infect humans.  If clinically indicated additional testing with an alternate test  methodology [phone]) is advised. The SARS-CoV-2 RNA is generally  detectable in upper and lower respiratory sp ecimens during the acute  phase of infection. The expected result is Negative. Fact Sheet for Patients:  StrictlyIdeas.no Fact Sheet for Healthcare Providers: BankingDealers.co.za This test is not yet approved or cleared by the [place] FDA and has been authorized for detection and/or diagnosis of SARS-CoV-2 by FDA under an Emergency Use Authorization (EUA).  This EUA will remain in effect (meaning this test can be used) for the duration of the COVID-19 declaration under Section 564(b)(1) of the Act, 21 U.S.C. section 360bbb-3(b)(1), unless the authorization is terminated or revoked sooner. Performed at [facility], [place] [address], [place], [place] [address]   Gastrointestinal Panel by PCR , Stool     Status: None   Collection Time:  01/12/19  5:55 PM  Result Value Ref Range Status   Campylobacter species NOT DETECTED NOT DETECTED Final   Plesimonas shigelloides NOT DETECTED NOT DETECTED Final   Salmonella species NOT DETECTED NOT DETECTED Final   Yersinia enterocolitica NOT DETECTED NOT DETECTED Final   Vibrio species NOT DETECTED NOT DETECTED Final   Vibrio cholerae NOT DETECTED NOT DETECTED Final   Enteroaggregative E coli (EAEC) NOT DETECTED NOT DETECTED Final   Enteropathogenic E coli (EPEC) NOT DETECTED NOT DETECTED Final   Enterotoxigenic E coli (ETEC) NOT DETECTED NOT DETECTED Final   Shiga like toxin producing E coli (STEC) NOT DETECTED NOT DETECTED Final   Shigella/Enteroinvasive E coli (EIEC) NOT DETECTED NOT DETECTED Final   Cryptosporidium NOT DETECTED NOT DETECTED Final   Cyclospora cayetanensis NOT DETECTED NOT DETECTED Final   Entamoeba histolytica NOT DETECTED NOT DETECTED Final   Giardia lamblia NOT DETECTED NOT DETECTED Final   Adenovirus F40/41 NOT DETECTED NOT DETECTED Final   Astrovirus NOT DETECTED NOT DETECTED Final   Norovirus GI/GII NOT DETECTED NOT DETECTED Final   Rotavirus A NOT DETECTED NOT DETECTED Final   Sapovirus (I, II, IV, and V) NOT DETECTED NOT DETECTED Final    Comment: Performed at [facility], [address] [place], [place] [address]  Body fluid culture     Status: None (Preliminary result)   Collection Time: 01/13/19 10:33 AM  Result Value Ref Range Status   Specimen Description PERICARDIAL [facility]  Final   Special Requests NONE  Final   Gram Stain   Final    ABUNDANT WBC PRESENT, PREDOMINANTLY PMN NO ORGANISMS SEEN    Culture   Final    NO GROWTH < 24 HOURS Performed at [facility], [address] [place], [place] [address]    Report Status PENDING  Incomplete  MRSA PCR Screening     Status: None   Collection Time: 01/13/19 12:34 PM  Result Value Ref Range Status  MRSA by PCR NEGATIVE NEGATIVE Final    Comment:        The GeneXpert MRSA Assay  (FDA approved for NASAL specimens only), is one component of a comprehensive MRSA colonization surveillance program. It is not intended to diagnose MRSA infection nor to guide or monitor treatment for MRSA infections. Performed at [facility], [place] [address] [place], [place] [address]          Radiology Studies: Ct Abdomen Pelvis Wo Contrast  Result Date: 01/14/2019 CLINICAL DATA:  Pulmonary edema, atrial fibrillation and pericardial effusion. Evaluate for underlying malignancy. EXAM: CT CHEST, ABDOMEN AND PELVIS WITHOUT CONTRAST TECHNIQUE: Multidetector CT imaging of the chest, abdomen and pelvis was performed following the standard protocol without IV contrast. COMPARISON:  Chest radiographs 01/11/2019 FINDINGS: CT CHEST FINDINGS Cardiovascular: Left IJ central venous catheter extends to the superior cavoatrial junction. There is atherosclerosis of the aorta, great vessels and coronary arteries. No acute vascular findings are identified. The heart is mildly enlarged. There is a small residual pericardial effusion. A pericardial drain is in place. Mediastinum/Nodes: There are no enlarged mediastinal, hilar or axillary lymph nodes. Hilar assessment is limited by the lack of intravenous contrast. The thyroid gland, trachea and esophagus appear unremarkable. Lungs/Pleura: There are small left-greater-than-right pleural effusions. There is mild dependent atelectasis in both lower lobes. There is also mild atelectasis in the lingula. No evidence endobronchial lesion or suspicious pulmonary nodule. Musculoskeletal/Chest wall: No chest wall mass or suspicious osseous findings. Mild thoracic spondylosis. CT ABDOMEN AND PELVIS FINDINGS Hepatobiliary: No focal hepatic abnormalities are identified on noncontrast imaging. There is no significant biliary dilatation post cholecystectomy. Pancreas: Unremarkable. No pancreatic ductal dilatation or surrounding inflammatory changes. Spleen: Normal in size  without focal abnormality. Adrenals/Urinary Tract: There is a small left adrenal nodule measuring 18 mm and 28 HU on image 20/3. The right adrenal gland appears normal. The kidneys and ureters appear normal. There is no urinary tract calculus or hydronephrosis. Bladder is decompressed by a Foley catheter. Stomach/Bowel: No evidence of bowel wall thickening, distention or surrounding inflammatory change. Vascular/Lymphatic: There are no enlarged abdominal or pelvic lymph nodes. Moderate aortic and branch vessel atherosclerosis. No acute vascular findings on noncontrast imaging. Reproductive: Hysterectomy.  No adnexal mass. Other: There is a small amount of ascites, primarily in the pelvis. There is bilateral flank edema. No focal extraluminal fluid collection. The anterior abdominal wall appears intact. Musculoskeletal: No acute or significant osseous findings. Chronic degenerative disc disease at L5-S1. IMPRESSION: 1. No specific evidence of malignancy within the chest, abdomen or pelvis. 2. Small indeterminate left adrenal nodule has a nonspecific density. Remote abdominal CT 05/06/2002 described a similar small nodule, such that this is likely an incidental adenoma. 3. Anasarca with bilateral pleural effusions, ascites and bilateral flank edema. Small pericardial effusion post pericardial drain placement. Associated bibasilar atelectasis. 4. Coronary and Aortic Atherosclerosis (ICD10-I70.0). Electronically Signed   By: Richardean Sale M.D.   On: 01/14/2019 15:09   Ct Chest Wo Contrast  Result Date: 01/14/2019 CLINICAL DATA:  Pulmonary edema, atrial fibrillation and pericardial effusion. Evaluate for underlying malignancy. EXAM: CT CHEST, ABDOMEN AND PELVIS WITHOUT CONTRAST TECHNIQUE: Multidetector CT imaging of the chest, abdomen and pelvis was performed following the standard protocol without IV contrast. COMPARISON:  Chest radiographs 01/11/2019 FINDINGS: CT CHEST FINDINGS Cardiovascular: Left IJ central  venous catheter extends to the superior cavoatrial junction. There is atherosclerosis of the aorta, great vessels and coronary arteries. No acute vascular findings are identified. The heart  is mildly enlarged. There is Jailon Schaible small residual pericardial effusion. Mera Gunkel pericardial drain is in place. Mediastinum/Nodes: There are no enlarged mediastinal, hilar or axillary lymph nodes. Hilar assessment is limited by the lack of intravenous contrast. The thyroid gland, trachea and esophagus appear unremarkable. Lungs/Pleura: There are small left-greater-than-right pleural effusions. There is mild dependent atelectasis in both lower lobes. There is also mild atelectasis in the lingula. No evidence endobronchial lesion or suspicious pulmonary nodule. Musculoskeletal/Chest wall: No chest wall mass or suspicious osseous findings. Mild thoracic spondylosis. CT ABDOMEN AND PELVIS FINDINGS Hepatobiliary: No focal hepatic abnormalities are identified on noncontrast imaging. There is no significant biliary dilatation post cholecystectomy. Pancreas: Unremarkable. No pancreatic ductal dilatation or surrounding inflammatory changes. Spleen: Normal in size without focal abnormality. Adrenals/Urinary Tract: There is Dierra Riesgo small left adrenal nodule measuring 18 mm and 28 HU on image 20/3. The right adrenal gland appears normal. The kidneys and ureters appear normal. There is no urinary tract calculus or hydronephrosis. Bladder is decompressed by Kary Colaizzi Foley catheter. Stomach/Bowel: No evidence of bowel wall thickening, distention or surrounding inflammatory change. Vascular/Lymphatic: There are no enlarged abdominal or pelvic lymph nodes. Moderate aortic and branch vessel atherosclerosis. No acute vascular findings on noncontrast imaging. Reproductive: Hysterectomy.  No adnexal mass. Other: There is Dakarai Mcglocklin small amount of ascites, primarily in the pelvis. There is bilateral flank edema. No focal extraluminal fluid collection. The anterior abdominal wall  appears intact. Musculoskeletal: No acute or significant osseous findings. Chronic degenerative disc disease at L5-S1. IMPRESSION: 1. No specific evidence of malignancy within the chest, abdomen or pelvis. 2. Small indeterminate left adrenal nodule has Gwendlyon Zumbro nonspecific density. Remote abdominal CT 05/06/2002 described Abigayl Hor similar small nodule, such that this is likely an incidental adenoma. 3. Anasarca with bilateral pleural effusions, ascites and bilateral flank edema. Small pericardial effusion post pericardial drain placement. Associated bibasilar atelectasis. 4. Coronary and Aortic Atherosclerosis (ICD10-I70.0). Electronically Signed   By: Richardean Sale M.D.   On: 01/14/2019 15:09        Scheduled Meds:  Chlorhexidine Gluconate Cloth  6 each Topical Daily   gabapentin  300 mg Oral QHS   HYDROcodone-acetaminophen  1 tablet Oral QHS   insulin aspart  0-5 Units Subcutaneous QHS   insulin aspart  0-9 Units Subcutaneous TID WC   latanoprost  1 drop Both Eyes QHS   mouth rinse  15 mL Mouth Rinse BID   Melatonin  9-15 mg Oral QHS   multivitamins with iron  1 tablet Oral Daily   pantoprazole (PROTONIX) IV  40 mg Intravenous Q12H   rOPINIRole  2 mg Oral QHS   senna  1 tablet Oral QHS   sodium chloride flush  3 mL Intravenous Q12H   ascorbic acid  1,000 mg Oral BID   Continuous Infusions:  sodium chloride     cefTRIAXone (ROCEPHIN)  IV 1 g (01/14/19 0032)     LOS: 2 days    Time spent: over 30 min    Fayrene Helper, MD [facility] Pager AMION  If 7PM-7AM, please contact night-coverage [email or web address] Password [email or web address] 01/14/2019, 3:29 PM

## 2019-01-14 NOTE — Progress Notes (Signed)
HR sustaining 130's-150's in a fib with soft bp Notified Falk-Martin, MD  1952: Received ordered for cardizem 30mg  Q6H

## 2019-01-14 NOTE — Progress Notes (Addendum)
HR sustaining 120s-130s in a-fib. MD paged. Awaiting for return call. Other VSS.    1250 received order for one time dose 30 mg dilt PO.

## 2019-01-14 NOTE — Evaluation (Signed)
Occupational Therapy Evaluation Patient Details Name: [name] MRN: [id number] DOB: 09-Apr-1951 Today's Date: 01/14/2019    History of Present Illness 68 y.o. female admitted with CHF exacerbation. Pt with diarrhea with bleeding 6/1 as well as decreased BP and extremity cyanosis. PMhx:  diastolic CHF, A. fib on Xarelto, COPD, CAD, glaucoma, panic disorder, low back pain, DM   Clinical Impression   Pt is typically independent. 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 Pt presents with mild impairment in cognition, generalized weakness, unsteady gait and decreased activity tolerance. She requires up to min assist for ADL and mobility.  Will follow acutely.    Follow Up Recommendations  Home health OT    Equipment Recommendations  None recommended by OT    Recommendations for Other Services       Precautions / Restrictions Precautions Precautions: Fall Precaution Comments: watch BP and O2 Restrictions Weight Bearing Restrictions: No      Mobility Bed Mobility               General bed mobility comments: pt seated in chair  Transfers Overall transfer level: Needs assistance Equipment used: Rolling walker (2 wheeled) Transfers: Sit to/from Stand Sit to Stand: Min guard         General transfer comment: cues for hand placement    Balance Overall balance assessment: Needs assistance   Sitting balance-Leahy Scale: Good       Standing balance-Leahy Scale: Fair                             ADL either performed or assessed with clinical judgement   ADL Overall ADL's : Needs assistance/impaired Eating/Feeding: Independent;Sitting   Grooming: Set up;Sitting   Upper Body Bathing: Minimal assistance;Sitting   Lower Body Bathing: Minimal assistance;Sit to/from stand   Upper Body Dressing : Minimal assistance;Sitting   Lower Body Dressing: Minimal assistance;Sit to/from stand   Toilet Transfer: Minimal assistance;Ambulation;BSC;RW    Toileting- Clothing Manipulation and Hygiene: Minimal assistance;Sit to/from stand       Functional mobility during ADLs: Minimal assistance;Rolling walker       Vision Baseline Vision/History: Wears glasses Wears Glasses: At all times Patient Visual Report: No change from baseline       Perception     Praxis      Pertinent Vitals/Pain Pain Assessment: No/denies pain     Hand Dominance Right   Extremity/Trunk Assessment Upper Extremity Assessment Upper Extremity Assessment: Generalized weakness;Overall [facility] for tasks assessed   Lower Extremity Assessment Lower Extremity Assessment: Defer to PT evaluation   Cervical / Trunk Assessment Cervical / Trunk Assessment: Kyphotic   Communication Communication Communication: No difficulties   Cognition Arousal/Alertness: Awake/alert Behavior During Therapy: WFL for tasks assessed/performed Overall Cognitive Status: Impaired/Different from baseline Area of Impairment: Memory;Problem solving;Following commands                     Memory: Decreased short-term memory Following Commands: Follows one step commands with increased time     Problem Solving: Slow processing;Difficulty sequencing;Requires verbal cues General Comments: pt without awareness of her deficits in cognition   General Comments       Exercises     Shoulder Instructions      Home Living Family/patient expects to be discharged to:: Private residence Living Arrangements: Spouse/significant other;Children Available Help at Discharge: Family;Available 24 hours/day Type of Home: House Home Access: Stairs to enter [employer] of Steps: 3  Home Layout: One level     Bathroom Shower/Tub: Occupational psychologist: Standard     Home Equipment: Clinical cytogeneticist - 2 wheels;Cane - single point   Additional Comments: DME belongs to partner      Prior Functioning/Environment Level of Independence: Independent                  OT Problem List: Decreased strength;Decreased activity tolerance;Impaired balance (sitting and/or standing);Decreased knowledge of use of DME or AE;Obesity;Decreased cognition;Cardiopulmonary status limiting activity      OT Treatment/Interventions: Self-care/ADL training;DME and/or AE instruction;Therapeutic activities;Cognitive remediation/compensation;Balance training;Patient/family education    OT Goals(Current goals can be found in the care plan section) Acute Rehab OT Goals Patient Stated Goal: return to work and home OT Goal Formulation: With patient Time For Goal Achievement: 01/28/19 Potential to Achieve Goals: Good ADL Goals Pt Will Perform Grooming: with supervision;standing Pt Will Perform Lower Body Bathing: with supervision;sit to/from stand Pt Will Perform Lower Body Dressing: with supervision;sit to/from stand Pt Will Transfer to Toilet: with supervision;ambulating;regular height toilet Pt Will Perform Toileting - Clothing Manipulation and hygiene: with supervision;sit to/from stand Pt Will Perform Tub/Shower Transfer: Shower transfer;with supervision;ambulating Additional ADL Goal #1: Pt will state at least 3 energy conservation strategies as instructed.  OT Frequency: Min 2X/week   Barriers to D/C:            Co-evaluation              AM-PAC OT "6 Clicks" Daily Activity     Outcome Measure Help from another person eating meals?: None Help from another person taking care of personal grooming?: A Little Help from another person toileting, which includes using toliet, bedpan, or urinal?: A Little Help from another person bathing (including washing, rinsing, drying)?: A Little Help from another person to put on and taking off regular upper body clothing?: None Help from another person to put on and taking off regular lower body clothing?: A Little 6 Click Score: 20   End of Session Equipment Utilized During Treatment: Gait belt;Rolling  walker;Oxygen  Activity Tolerance: Patient tolerated treatment well Patient left: in chair;with call bell/phone within reach;with chair alarm set  OT Visit Diagnosis: Unsteadiness on feet (R26.81);Other abnormalities of gait and mobility (R26.89);Muscle weakness (generalized) (M62.81);Other symptoms and signs involving cognitive function                Time: 0623-7628 OT Time Calculation (min): 31 min Charges:  OT General Charges $OT Visit: 1 Visit OT Evaluation $OT Eval Moderate Complexity: 1 Mod OT Treatments $Self Care/Home Management : 8-22 mins  Nestor Lewandowsky, OTR/L Acute Rehabilitation Services Pager: [phone] Office: [phone]  [name] 01/14/2019, 10:23 AM

## 2019-01-15 DIAGNOSIS — E871 Hypo-osmolality and hyponatremia: Secondary | ICD-10-CM

## 2019-01-15 DIAGNOSIS — I10 Essential (primary) hypertension: Secondary | ICD-10-CM

## 2019-01-15 DIAGNOSIS — N179 Acute kidney failure, unspecified: Secondary | ICD-10-CM

## 2019-01-15 DIAGNOSIS — D500 Iron deficiency anemia secondary to blood loss (chronic): Secondary | ICD-10-CM

## 2019-01-15 DIAGNOSIS — E1159 Type 2 diabetes mellitus with other circulatory complications: Secondary | ICD-10-CM

## 2019-01-15 DIAGNOSIS — R197 Diarrhea, unspecified: Secondary | ICD-10-CM

## 2019-01-15 DIAGNOSIS — R0602 Shortness of breath: Secondary | ICD-10-CM

## 2019-01-15 LAB — BASIC METABOLIC PANEL
Anion gap: 13 (ref 5–15)
BUN: 47 mg/dL — ABNORMAL HIGH (ref 8–23)
CO2: 23 mmol/L (ref 22–32)
Calcium: 8 mg/dL — ABNORMAL LOW (ref 8.9–10.3)
Chloride: 88 mmol/L — ABNORMAL LOW (ref 98–111)
Creatinine, Ser: 1.61 mg/dL — ABNORMAL HIGH (ref 0.44–1.00)
GFR calc Af Amer: 37 mL/min — ABNORMAL LOW (ref >60)
GFR calc non Af Amer: 32 mL/min — ABNORMAL LOW (ref >60)
Glucose, Bld: 157 mg/dL — ABNORMAL HIGH (ref 70–99)
Potassium: 3.5 mmol/L (ref 3.5–5.1)
Sodium: 124 mmol/L — ABNORMAL LOW (ref 135–145)

## 2019-01-15 LAB — COMPREHENSIVE METABOLIC PANEL
ALT: 726 U/L — ABNORMAL HIGH (ref 0–44)
AST: 365 U/L — ABNORMAL HIGH (ref 15–41)
Albumin: 3 g/dL — ABNORMAL LOW (ref 3.5–5.0)
Alkaline Phosphatase: 93 U/L (ref 38–126)
Anion gap: 13 (ref 5–15)
BUN: 48 mg/dL — ABNORMAL HIGH (ref 8–23)
CO2: 24 mmol/L (ref 22–32)
Calcium: 8.4 mg/dL — ABNORMAL LOW (ref 8.9–10.3)
Chloride: 87 mmol/L — ABNORMAL LOW (ref 98–111)
Creatinine, Ser: 1.56 mg/dL — ABNORMAL HIGH (ref 0.44–1.00)
GFR calc Af Amer: 39 mL/min — ABNORMAL LOW (ref >60)
GFR calc non Af Amer: 33 mL/min — ABNORMAL LOW (ref >60)
Glucose, Bld: 148 mg/dL — ABNORMAL HIGH (ref 70–99)
Potassium: 3.7 mmol/L (ref 3.5–5.1)
Sodium: 124 mmol/L — ABNORMAL LOW (ref 135–145)
Total Bilirubin: 0.9 mg/dL (ref 0.3–1.2)
Total Protein: 6.1 g/dL — ABNORMAL LOW (ref 6.5–8.1)

## 2019-01-15 LAB — CBC
HCT: 22.2 % — ABNORMAL LOW (ref 36.0–46.0)
Hemoglobin: 7.3 g/dL — ABNORMAL LOW (ref 12.0–15.0)
MCH: 24.3 pg — ABNORMAL LOW (ref 26.0–34.0)
MCHC: 32.9 g/dL (ref 30.0–36.0)
MCV: 74 fL — ABNORMAL LOW (ref 80.0–100.0)
Platelets: 345 10*3/uL (ref 150–400)
RBC: 3 MIL/uL — ABNORMAL LOW (ref 3.87–5.11)
RDW: 20 % — ABNORMAL HIGH (ref 11.5–15.5)
WBC: 11.9 10*3/uL — ABNORMAL HIGH (ref 4.0–10.5)
nRBC: 0 % (ref 0.0–0.2)

## 2019-01-15 LAB — IRON AND TIBC
Iron: 17 ug/dL — ABNORMAL LOW (ref 28–170)
Saturation Ratios: 5 % — ABNORMAL LOW (ref 10.4–31.8)
TIBC: 374 ug/dL (ref 250–450)
UIBC: 357 ug/dL

## 2019-01-15 LAB — RHEUMATOID FACTOR: Rheumatoid fact SerPl-aCnc: <10 IU/mL (ref 0.0–13.9)

## 2019-01-15 LAB — GLUCOSE, CAPILLARY
Glucose-Capillary: 148 mg/dL — ABNORMAL HIGH (ref 70–99)
Glucose-Capillary: 172 mg/dL — ABNORMAL HIGH (ref 70–99)
Glucose-Capillary: 172 mg/dL — ABNORMAL HIGH (ref 70–99)
Glucose-Capillary: 187 mg/dL — ABNORMAL HIGH (ref 70–99)

## 2019-01-15 LAB — HIV ANTIBODY (ROUTINE TESTING W REFLEX): HIV Screen 4th Generation wRfx: NONREACTIVE

## 2019-01-15 LAB — MAGNESIUM: Magnesium: 1.7 mg/dL (ref 1.7–2.4)

## 2019-01-15 LAB — ANA W/REFLEX IF POSITIVE: Anti Nuclear Antibody (ANA): NEGATIVE

## 2019-01-15 LAB — VITAMIN B12: Vitamin B-12: 3020 pg/mL — ABNORMAL HIGH (ref 180–914)

## 2019-01-15 LAB — FERRITIN: Ferritin: 852 ng/mL — ABNORMAL HIGH (ref 11–307)

## 2019-01-15 LAB — FOLATE: Folate: 38.3 ng/mL (ref >5.9)

## 2019-01-15 MED ORDER — MAGNESIUM SULFATE 2 GM/50ML IV SOLN
2.0000 g | Freq: Once | INTRAVENOUS | Status: AC
  Administered 2019-01-15: 2 g via INTRAVENOUS
  Filled 2019-01-15: qty 50

## 2019-01-15 MED ORDER — DILTIAZEM HCL ER COATED BEADS 180 MG PO CP24
180.0000 mg | ORAL_CAPSULE | Freq: Every day | ORAL | Status: DC
  Administered 2019-01-15 – 2019-01-16 (×2): 180 mg via ORAL
  Filled 2019-01-15 (×2): qty 1

## 2019-01-15 MED ORDER — DILTIAZEM HCL 90 MG PO TABS
45.0000 mg | ORAL_TABLET | Freq: Four times a day (QID) | ORAL | Status: DC
  Filled 2019-01-15: qty 0.5

## 2019-01-15 MED ORDER — FUROSEMIDE 10 MG/ML IJ SOLN
80.0000 mg | Freq: Once | INTRAMUSCULAR | Status: AC
  Administered 2019-01-15: 80 mg via INTRAVENOUS
  Filled 2019-01-15: qty 8

## 2019-01-15 MED ORDER — POTASSIUM CHLORIDE CRYS ER 20 MEQ PO TBCR
30.0000 meq | EXTENDED_RELEASE_TABLET | Freq: Once | ORAL | Status: AC
  Administered 2019-01-15: 30 meq via ORAL
  Filled 2019-01-15: qty 1

## 2019-01-15 MED ORDER — PANTOPRAZOLE SODIUM 40 MG PO TBEC
40.0000 mg | DELAYED_RELEASE_TABLET | Freq: Every day | ORAL | Status: DC
  Administered 2019-01-16: 40 mg via ORAL
  Filled 2019-01-15: qty 1

## 2019-01-15 NOTE — Progress Notes (Signed)
PROGRESS NOTE    [name]  AVW:[id number] DOB: [birth year] DOA: 01/11/2019 PCP: Hoyt Koch, MD   Brief Narrative:  68 y.o. female with [name] Past Medical History of chronic hyponatremia, Dm2, HTN, who presents with volume overload/CHF exac, pericardial effusion, worsening Na, AKI, and BRBPR.  Assessment & Plan:   Principal Problem:   Acute on chronic diastolic CHF (congestive heart failure) (HCC) Active Problems:   Type 2 diabetes mellitus with vascular disease (HCC)   Anemia   Essential hypertension   CAD S/P percutaneous coronary angioplasty   COPD mixed type (HCC)   Permanent atrial fibrillation   Hyponatremia   CHF (congestive heart failure) (HCC)   Bradycardia   Pericardial effusion   Diarrhea   Shortness of breath   Acute renal failure (HCC)   BRBPR (bright red blood per rectum)   Cardiac tamponade  Cardiogenic Shock in setting of pericardial tamponade   Heart Failure Exacerbation:  Echo 6/1 with EF 55-60% (see report) S/p pericardiocentesis on 6/2 S/p RHC with evidence of pericardial tamponade and post tap with pulmonary venous HTN Diuresis per cardiology and renal Follow up w/u of effusion (RF negative, ANA pending, ANCA pending, CRP - 9.6, ESR - 35, HIV - negative) Drain removed today per cards CT chest abdomen pelvis without evidence of malignancy - was notable for anasarca.  Acute Kidney Injury: baseline creatinine < 0.1.  Thought to be hemodynamically mediated in setting of pericardial effusion with tamponade. Peaked at 3.74  Now improving Follow I/O, daily weights Appreciate nephrology c/s Holding ACEi, BP meds at this time  Elevated Liver Enzymes: Possibly hepatic congestion with above.  Will continue to monitor.  Follow acute hepatitis panel.  Abdominal imaging without focal hepatic abnormalities.  Hyponatremia: Some chronicity to this.  Worsened to 113.  Now improving. S/p lasix and tolvaptan Urine sodium 6/1 23, urine osm 258   In  setting of above, suspected due to hypervolemic hyponatremia Appreciate nephrology recs  Type 2 diabetes mellitus with vascular disease (HCC)serum glucose 217 on admission. A1C 6.5 2/20. Home meds include invokana, janumet. -sliding scale insulin -holding orals  Essential hypertension- hypotension has improved after pericardiocentesis.   Holding beta-blocker and ace inhibitor.  Diltiazem has been restarted.   - continue to monitor, resume as tolerated  Diarrhea   BRBPR. Concern for BRBPR, but now appears to be resolved.  FOBT was negative and Hb stable.  Sounds like she had skin tear which may have been bleeding, she has less concern for BRBPR when we review this today.   -hold xarelto -hold celebrex -PPI -GI path panel negative  Anemia related to chronic blood loss   Iron deficiency Anemia.  Continue to monitor Downtrend since pericardiocentesis, will continue to closely monitor Labs suggestive of AOCD with iron deficiency Transfuse for Hb <7   CAD S/P percutaneous coronary angioplasty-stable  COPD mixed type (HCC)-compensated and stable at this time lung exam clear  Permanent atrial fibrillation with RVR- cards recommending holding anticoagulation for Miyah Hampshire few weeks given pericardial effusion with bloody fluid.  Resume home dose of diltiazem  Pyuria: will add on urine culture.  She seems to be asymptomatic.  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 Evidence of pericardial tamponade  2. Post-tap has persistent pulmonary venous HTN  Will work-up etiology of effusion and continue to manage HF medically   Pericardiocentesis 6/2  1. Cardiac tamponade 2. Successful pericardiocentesis with removal of 900 cc of bloody fluid  Recommendations: Will leave drain in place for at least 25 hours. Fluid studies to be sent to lab.   Echo 6/1  IMPRESSIONS    1. The left ventricle has normal systolic function, with an ejection fraction of 55-60%. The cavity size was normal. Left ventricular diastolic function could not be evaluated secondary to atrial fibrillation. There is constrictive physiology.  2. The right ventricle has normal systolic function. The cavity was normal. There is no increase in right ventricular wall thickness.  3. Left atrial size was moderately dilated.  4. Right atrial size was moderately dilated.  5. Large pericardial effusion.  6. The pericardial effusion is circumferential.  7. There is Shaunna Rosetti large circumfrential echolucent pericaradial effusion without frank evidence of tamponade.  8. Aortic valve regurgitation is trivial by color flow Doppler. No stenosis of the aortic valve.  9. The aortic root and ascending aorta are normal in size and structure. 10. The inferior vena cava was dilated in size with <50% respiratory variability. 11. The interatrial septum was not well visualized.   Antimicrobials: Anti-infectives (From admission, onward)   Start     Dose/Rate Route Frequency Ordered Stop   01/13/19 2300  cefTRIAXone (ROCEPHIN) 1 g in sodium chloride 0.9 % 100 mL IVPB     1 g 200 mL/hr over 30 Minutes Intravenous Every 24 hours 01/13/19 2226           Subjective: Wants to go home Feeling better Discuss we're still working on several issues   Objective: Vitals:   01/15/19 0900  01/15/19 1100 01/15/19 1139 01/15/19 1200  BP: (!) 133/111 (!) 148/92  (!) 119/50  Pulse: (!) 115   (!) 118  Resp: 16 20  (!) 23  Temp:   98 F (36.7 C)   TempSrc:   Oral   SpO2: 92%   98%  Weight:      Height:        Intake/Output Summary (Last 24 hours) at 01/15/2019 1327 Last data filed at 01/15/2019 1200 Gross per 24 hour  Intake 688.22 ml  Output 4015 ml  Net -3326.78 ml   Filed Weights   01/13/19 0621 01/14/19 0600 01/15/19 0530  Weight: 92.9 kg 90.8 kg 88.6 kg    Examination:  General: No acute distress. Cardiovascular: tachy, irreg irregular Lungs: Clear to auscultation bilaterally  Abdomen: Soft, nontender, nondistended  Neurological: Alert and oriented 3. Moves all extremities 4. Cranial nerves II through XII grossly intact. Skin: Warm and dry. No rashes or lesions. Extremities: no lee Psychiatric: Mood and affect are normal. Insight and judgment are appropriate.    Data Reviewed: I have personally reviewed following labs and imaging studies  CBC: Recent Labs  Lab 01/11/19 1608 01/12/19 1458 01/12/19 2029 01/13/19 0457  01/13/19 1019 01/13/19 1052 01/14/19 0225 01/14/19 1102 01/15/19 0344  WBC 12.4* 16.7*  14.5* 17.7*  --   --   --  14.9*  --  11.9*  NEUTROABS 10.8*  --   --   --   --   --   --   --   --   --   HGB 8.7* 9.2* 9.0* 8.9*   < > 9.9* 8.2* 7.6* 7.8* 7.3*  HCT 27.5* 27.8* 27.3* 26.8*   < > 29.0* 24.0* 22.4* 23.6* 22.2*  MCV 75.5* 74.3* 73.6* 73.2*  --   --   --  72.7*  --  74.0*  PLT 384 511* 485* 493*  --   --   --  347  --  345   < > = values in this interval not displayed.   Basic Metabolic Panel: Recent Labs  Lab 01/14/19 0743 01/14/19 1102 01/14/19 1810 01/15/19 0120 01/15/19 0344  NA 118* 120* 121* 124* 124*  K 4.1 3.9 3.9 3.5 3.7  CL 81* 81* 83* 88* 87*  CO2 '22 24 24 23 24  ' GLUCOSE 122* 177* 219* 157* 148*  BUN 58* 58* 55* 47* 48*  CREATININE 2.70* 2.56* 2.19* 1.61* 1.56*  CALCIUM 8.6* 8.6* 8.7* 8.0* 8.4*  MG  --   --    --   --  1.7   GFR: Estimated Creatinine Clearance: 34.7 mL/min (Jeffery Gammell) (by C-G formula based on SCr of 1.56 mg/dL (H)). Liver Function Tests: Recent Labs  Lab 01/11/19 1608 01/15/19 0344  AST 22 365*  ALT 16 726*  ALKPHOS 76 93  BILITOT 0.7 0.9  PROT 6.8 6.1*  ALBUMIN 3.1* 3.0*   No results for input(s): LIPASE, AMYLASE in the last 168 hours. No results for input(s): AMMONIA in the last 168 hours. Coagulation Profile: No results for input(s): INR, PROTIME in the last 168 hours. Cardiac Enzymes: Recent Labs  Lab 01/11/19 1608 01/11/19 2301 01/12/19 0254 01/12/19 0833  TROPONINI <0.03 <0.03 <0.03 <0.03   BNP (last 3 results) Recent Labs    11/27/18 1547  PROBNP 219.0*   HbA1C: No results for input(s): HGBA1C in the last 72 hours. CBG: Recent Labs  Lab 01/14/19 1109 01/14/19 1505 01/14/19 2153 01/15/19 0641 01/15/19 1141  GLUCAP 152* 92 185* 148* 172*   Lipid Profile: No results for input(s): CHOL, HDL, LDLCALC, TRIG, CHOLHDL, LDLDIRECT in the last 72 hours. Thyroid Function Tests: No results for input(s): TSH, T4TOTAL, FREET4, T3FREE, THYROIDAB in the last 72 hours. Anemia Panel: Recent Labs    01/15/19 1004  VITAMINB12 3,020*  FOLATE 38.3  FERRITIN 852*  TIBC 374  IRON 17*   Sepsis Labs: No results for input(s): PROCALCITON, LATICACIDVEN in the last 168 hours.  Recent Results (from the past 240 hour(s))  SARS Coronavirus 2 (CEPHEID- Performed in [place] hospital lab), Hosp Order     Status: None   Collection Time: 01/11/19  4:18 PM  Result Value Ref Range Status   SARS Coronavirus 2 NEGATIVE NEGATIVE Final    Comment: (NOTE) If result is NEGATIVE SARS-CoV-2 target nucleic acids are NOT DETECTED. The SARS-CoV-2 RNA is generally detectable in upper and lower  respiratory specimens during the acute phase of infection. The lowest  concentration of SARS-CoV-2 viral copies this assay can detect is 250  copies / mL. Daylani Deblois negative result does not  preclude SARS-CoV-2 infection  and should not be used as the sole basis for treatment or other  patient management decisions.  Jatavius Ellenwood negative result may occur with  improper specimen collection / handling, submission of specimen other  than nasopharyngeal swab, presence of viral mutation(s) within the  areas targeted by this assay, and inadequate number of viral copies  (<250 copies / mL). Ashad Fawbush negative result must be combined with clinical  observations, patient history, and epidemiological information. If result is POSITIVE SARS-CoV-2 target nucleic acids are DETECTED. The SARS-CoV-2 RNA is generally detectable in upper and lower  respiratory specimens dur ing the acute phase of infection.  Positive  results are indicative of active infection with SARS-CoV-2.  Clinical  correlation with patient history and other diagnostic information is  necessary to determine patient infection status.  Positive results do  not rule out bacterial infection or co-infection with other viruses. If result is PRESUMPTIVE POSTIVE SARS-CoV-2 nucleic acids MAY BE PRESENT.   Jeanmarie Mccowen presumptive positive result was obtained on the submitted specimen  and confirmed on repeat testing.  While 2019 novel coronavirus  (SARS-CoV-2) nucleic acids may be present in the submitted sample  additional confirmatory testing may be necessary for epidemiological  and / or clinical management purposes  to differentiate between  SARS-CoV-2 and other Sarbecovirus currently known to infect humans.  If clinically indicated additional testing with an alternate test  methodology [phone]) is advised. The SARS-CoV-2 RNA is generally  detectable in upper and lower respiratory sp ecimens during the acute  phase of infection. The expected result is Negative. Fact Sheet for Patients:  StrictlyIdeas.no Fact Sheet for Healthcare Providers: BankingDealers.co.za This test is not yet approved or cleared by  the [place] FDA and has been authorized for detection and/or diagnosis of SARS-CoV-2 by FDA under an Emergency Use Authorization (EUA).  This EUA will remain in effect (meaning this test can be used) for the duration of the COVID-19 declaration under Section 564(b)(1) of the Act, 21 U.S.C. section 360bbb-3(b)(1), unless the authorization is terminated or revoked sooner. Performed at [facility], [place] [address] [place], [place] [address]   Gastrointestinal Panel by PCR , Stool     Status: None   Collection Time: 01/12/19  5:55 PM  Result Value Ref Range Status   Campylobacter species NOT DETECTED NOT DETECTED Final   Plesimonas shigelloides NOT DETECTED NOT DETECTED Final   Salmonella species NOT DETECTED NOT DETECTED Final   Yersinia enterocolitica NOT DETECTED NOT DETECTED Final   Vibrio species NOT DETECTED NOT DETECTED Final   Vibrio cholerae NOT DETECTED NOT DETECTED Final   Enteroaggregative E coli (EAEC) NOT DETECTED NOT DETECTED Final   Enteropathogenic E coli (EPEC) NOT DETECTED NOT DETECTED Final   Enterotoxigenic E coli (ETEC) NOT DETECTED NOT DETECTED Final   Shiga like toxin producing E coli (STEC) NOT DETECTED NOT DETECTED Final   Shigella/Enteroinvasive E coli (EIEC) NOT DETECTED NOT DETECTED Final   Cryptosporidium NOT DETECTED NOT DETECTED Final   Cyclospora cayetanensis NOT DETECTED NOT DETECTED Final   Entamoeba histolytica NOT DETECTED NOT DETECTED Final   Giardia lamblia NOT DETECTED NOT DETECTED Final   Adenovirus F40/41 NOT DETECTED NOT DETECTED Final   Astrovirus NOT DETECTED NOT DETECTED Final   Norovirus GI/GII NOT DETECTED NOT DETECTED Final   Rotavirus Nida Manfredi NOT DETECTED NOT DETECTED Final   Sapovirus (I, II, IV, and V) NOT DETECTED NOT DETECTED Final    Comment: Performed at [facility], [address] [place], [place] [address]  Body fluid culture     Status: None (Preliminary result)   Collection Time: 01/13/19 10:33 AM  Result  Value Ref Range Status   Specimen Description PERICARDIAL [facility]  Final  Special Requests NONE  Final   Gram Stain   Final    ABUNDANT WBC PRESENT, PREDOMINANTLY PMN NO ORGANISMS SEEN    Culture   Final    NO GROWTH 2 DAYS Performed at [facility], [place] [address] [place], [place] [address]    Report Status PENDING  Incomplete  MRSA PCR Screening     Status: None   Collection Time: 01/13/19 12:34 PM  Result Value Ref Range Status   MRSA by PCR NEGATIVE NEGATIVE Final    Comment:        The GeneXpert MRSA Assay (FDA approved for NASAL specimens only), is one component of Sydny Schnitzler comprehensive MRSA colonization surveillance program. It is not intended to diagnose MRSA infection nor to guide or monitor treatment for MRSA infections. Performed at [facility], [place] [address], [place], [place] [address]          Radiology Studies: Ct Abdomen Pelvis Wo Contrast  Result Date: 01/14/2019 CLINICAL DATA:  Pulmonary edema, atrial fibrillation and pericardial effusion. Evaluate for underlying malignancy. EXAM: CT CHEST, ABDOMEN AND PELVIS WITHOUT CONTRAST TECHNIQUE: Multidetector CT imaging of the chest, abdomen and pelvis was performed following the standard protocol without IV contrast. COMPARISON:  Chest radiographs 01/11/2019 FINDINGS: CT CHEST FINDINGS Cardiovascular: Left IJ central venous catheter extends to the superior cavoatrial junction. There is atherosclerosis of the aorta, great vessels and coronary arteries. No acute vascular findings are identified. The heart is mildly enlarged. There is Hancel Ion small residual pericardial effusion. Latasha Buczkowski pericardial drain is in place. Mediastinum/Nodes: There are no enlarged mediastinal, hilar or axillary lymph nodes. Hilar assessment is limited by the lack of intravenous contrast. The thyroid gland, trachea and esophagus appear unremarkable. Lungs/Pleura: There are small left-greater-than-right pleural effusions. There is mild dependent  atelectasis in both lower lobes. There is also mild atelectasis in the lingula. No evidence endobronchial lesion or suspicious pulmonary nodule. Musculoskeletal/Chest wall: No chest wall mass or suspicious osseous findings. Mild thoracic spondylosis. CT ABDOMEN AND PELVIS FINDINGS Hepatobiliary: No focal hepatic abnormalities are identified on noncontrast imaging. There is no significant biliary dilatation post cholecystectomy. Pancreas: Unremarkable. No pancreatic ductal dilatation or surrounding inflammatory changes. Spleen: Normal in size without focal abnormality. Adrenals/Urinary Tract: There is Suleima Ohlendorf small left adrenal nodule measuring 18 mm and 28 HU on image 20/3. The right adrenal gland appears normal. The kidneys and ureters appear normal. There is no urinary tract calculus or hydronephrosis. Bladder is decompressed by Izaias Krupka Foley catheter. Stomach/Bowel: No evidence of bowel wall thickening, distention or surrounding inflammatory change. Vascular/Lymphatic: There are no enlarged abdominal or pelvic lymph nodes. Moderate aortic and branch vessel atherosclerosis. No acute vascular findings on noncontrast imaging. Reproductive: Hysterectomy.  No adnexal mass. Other: There is Zackarie Chason small amount of ascites, primarily in the pelvis. There is bilateral flank edema. No focal extraluminal fluid collection. The anterior abdominal wall appears intact. Musculoskeletal: No acute or significant osseous findings. Chronic degenerative disc disease at L5-S1. IMPRESSION: 1. No specific evidence of malignancy within the chest, abdomen or pelvis. 2. Small indeterminate left adrenal nodule has Odel Schmid nonspecific density. Remote abdominal CT 05/06/2002 described Timberlynn Kizziah similar small nodule, such that this is likely an incidental adenoma. 3. Anasarca with bilateral pleural effusions, ascites and bilateral flank edema. Small pericardial effusion post pericardial drain placement. Associated bibasilar atelectasis. 4. Coronary and Aortic Atherosclerosis  (ICD10-I70.0). Electronically Signed   By: Richardean Sale M.D.   On: 01/14/2019 15:09   Ct Chest Wo Contrast  Result Date:  01/14/2019 CLINICAL DATA:  Pulmonary edema, atrial fibrillation and pericardial effusion. Evaluate for underlying malignancy. EXAM: CT CHEST, ABDOMEN AND PELVIS WITHOUT CONTRAST TECHNIQUE: Multidetector CT imaging of the chest, abdomen and pelvis was performed following the standard protocol without IV contrast. COMPARISON:  Chest radiographs 01/11/2019 FINDINGS: CT CHEST FINDINGS Cardiovascular: Left IJ central venous catheter extends to the superior cavoatrial junction. There is atherosclerosis of the aorta, great vessels and coronary arteries. No acute vascular findings are identified. The heart is mildly enlarged. There is Simon Llamas small residual pericardial effusion. Tonee Silverstein pericardial drain is in place. Mediastinum/Nodes: There are no enlarged mediastinal, hilar or axillary lymph nodes. Hilar assessment is limited by the lack of intravenous contrast. The thyroid gland, trachea and esophagus appear unremarkable. Lungs/Pleura: There are small left-greater-than-right pleural effusions. There is mild dependent atelectasis in both lower lobes. There is also mild atelectasis in the lingula. No evidence endobronchial lesion or suspicious pulmonary nodule. Musculoskeletal/Chest wall: No chest wall mass or suspicious osseous findings. Mild thoracic spondylosis. CT ABDOMEN AND PELVIS FINDINGS Hepatobiliary: No focal hepatic abnormalities are identified on noncontrast imaging. There is no significant biliary dilatation post cholecystectomy. Pancreas: Unremarkable. No pancreatic ductal dilatation or surrounding inflammatory changes. Spleen: Normal in size without focal abnormality. Adrenals/Urinary Tract: There is Chesney Suares small left adrenal nodule measuring 18 mm and 28 HU on image 20/3. The right adrenal gland appears normal. The kidneys and ureters appear normal. There is no urinary tract calculus or  hydronephrosis. Bladder is decompressed by Jarely Juncaj Foley catheter. Stomach/Bowel: No evidence of bowel wall thickening, distention or surrounding inflammatory change. Vascular/Lymphatic: There are no enlarged abdominal or pelvic lymph nodes. Moderate aortic and branch vessel atherosclerosis. No acute vascular findings on noncontrast imaging. Reproductive: Hysterectomy.  No adnexal mass. Other: There is Yazmyn Valbuena small amount of ascites, primarily in the pelvis. There is bilateral flank edema. No focal extraluminal fluid collection. The anterior abdominal wall appears intact. Musculoskeletal: No acute or significant osseous findings. Chronic degenerative disc disease at L5-S1. IMPRESSION: 1. No specific evidence of malignancy within the chest, abdomen or pelvis. 2. Small indeterminate left adrenal nodule has Dessiree Sze nonspecific density. Remote abdominal CT 05/06/2002 described Sharilyn Geisinger similar small nodule, such that this is likely an incidental adenoma. 3. Anasarca with bilateral pleural effusions, ascites and bilateral flank edema. Small pericardial effusion post pericardial drain placement. Associated bibasilar atelectasis. 4. Coronary and Aortic Atherosclerosis (ICD10-I70.0). Electronically Signed   By: Richardean Sale M.D.   On: 01/14/2019 15:09        Scheduled Meds:  Chlorhexidine Gluconate Cloth  6 each Topical Daily   diltiazem  180 mg Oral Daily   gabapentin  300 mg Oral QHS   HYDROcodone-acetaminophen  1 tablet Oral QHS   insulin aspart  0-5 Units Subcutaneous QHS   insulin aspart  0-9 Units Subcutaneous TID WC   latanoprost  1 drop Both Eyes QHS   mouth rinse  15 mL Mouth Rinse BID   Melatonin  9-15 mg Oral QHS   multivitamins with iron  1 tablet Oral Daily   pantoprazole (PROTONIX) IV  40 mg Intravenous Q12H   rOPINIRole  2 mg Oral QHS   senna  1 tablet Oral QHS   sodium chloride flush  3 mL Intravenous Q12H   ascorbic acid  1,000 mg Oral BID   Continuous Infusions:  sodium chloride      cefTRIAXone (ROCEPHIN)  IV 1 g (01/14/19 2231)     LOS: 3 days    Time spent: over 30 min  Fayrene Helper, MD [facility] Pager AMION  If 7PM-7AM, please contact night-coverage [email or web address] Password [email or web address] 01/15/2019, 1:27 PM

## 2019-01-15 NOTE — Progress Notes (Signed)
PT Cancellation Note  Patient Details Name: ANGLEA GORDNER MRN: [id number] DOB: Jan 09, 1951   Cancelled Treatment:    Reason Eval/Treat Not Completed: Other (comment)(Pt had gotten lunch.  Nurse states she will walk pt in pm. )   Denice Paradise 01/15/2019, 11:42 AM  [place] Pager:  [phone]  Office:  [phone]

## 2019-01-15 NOTE — Progress Notes (Signed)
Patient HR sustaining 130-'s 150's. Spoke with on call provider   No new orders at this time

## 2019-01-15 NOTE — Progress Notes (Signed)
Advanced Heart Failure Rounding Note   Subjective:    Underwent pericardial tap on 6/2. Almost 1L of bloody fluid off. Feels much better. Hemodynamics improved.  Pericardial tube remains in place. Essentially no drainage overnight.   Feels good. Wants to go home. Remains in AF with RVR. Rates 110-120   Objective:   Weight Range:  Vital Signs:   Temp:  [97.6 F (36.4 C)-98.8 F (37.1 C)] 98.8 F (37.1 C) (06/04 0757) Pulse Rate:  [111-141] 113 (06/04 0800) Resp:  [12-23] 13 (06/04 0800) BP: (92-149)/(47-78) 115/73 (06/04 0800) SpO2:  [88 %-99 %] 94 % (06/04 0800) Weight:  [88.6 kg] 88.6 kg (06/04 0530) Last BM Date: 01/13/19  Weight change: Filed Weights   01/13/19 0621 01/14/19 0600 01/15/19 0530  Weight: 92.9 kg 90.8 kg 88.6 kg    Intake/Output:   Intake/Output Summary (Last 24 hours) at 01/15/2019 0940 Last data filed at 01/15/2019 0800 Gross per 24 hour  Intake 638.22 ml  Output 3765 ml  Net -3126.78 ml     Physical Exam: General:  Lying in bed  Mild audible wheezing HEENT: normal Neck: supple. JVP to jaw Carotids 2+ bilat; no bruits. No lymphadenopathy or thryomegaly appreciated. Cor: PMI nondisplaced. Irr tachy No rubs, gallops or murmurs. + pericardial drain site ok  Lungs: mild EE wheeze Abdomen: obese soft, nontender, nondistended. No hepatosplenomegaly. No bruits or masses. Good bowel sounds. Extremities: no cyanosis, clubbing, rash, 2+ edema Neuro: alert & orientedx3, cranial nerves grossly intact. moves all 4 extremities w/o difficulty. Affect pleasant    Telemetry: AF 100-120 Personally reviewed   Labs: Basic Metabolic Panel: Recent Labs  Lab 01/14/19 0743 01/14/19 1102 01/14/19 1810 01/15/19 0120 01/15/19 0344  NA 118* 120* 121* 124* 124*  K 4.1 3.9 3.9 3.5 3.7  CL 81* 81* 83* 88* 87*  CO2 '22 24 24 23 24  ' GLUCOSE 122* 177* 219* 157* 148*  BUN 58* 58* 55* 47* 48*  CREATININE 2.70* 2.56* 2.19* 1.61* 1.56*  CALCIUM 8.6* 8.6* 8.7*  8.0* 8.4*  MG  --   --   --   --  1.7    Liver Function Tests: Recent Labs  Lab 01/11/19 1608 01/15/19 0344  AST 22 365*  ALT 16 726*  ALKPHOS 76 93  BILITOT 0.7 0.9  PROT 6.8 6.1*  ALBUMIN 3.1* 3.0*   No results for input(s): LIPASE, AMYLASE in the last 168 hours. No results for input(s): AMMONIA in the last 168 hours.  CBC: Recent Labs  Lab 01/11/19 1608 01/12/19 1458 01/12/19 2029 01/13/19 0457  01/13/19 1019 01/13/19 1052 01/14/19 0225 01/14/19 1102 01/15/19 0344  WBC 12.4* 16.7* 14.5* 17.7*  --   --   --  14.9*  --  11.9*  NEUTROABS 10.8*  --   --   --   --   --   --   --   --   --   HGB 8.7* 9.2* 9.0* 8.9*   < > 9.9* 8.2* 7.6* 7.8* 7.3*  HCT 27.5* 27.8* 27.3* 26.8*   < > 29.0* 24.0* 22.4* 23.6* 22.2*  MCV 75.5* 74.3* 73.6* 73.2*  --   --   --  72.7*  --  74.0*  PLT 384 511* 485* 493*  --   --   --  347  --  345   < > = values in this interval not displayed.    Cardiac Enzymes: Recent Labs  Lab 01/11/19 1608 01/11/19 2301 01/12/19 0254 01/12/19 3474  TROPONINI <0.03 <0.03 <0.03 <0.03    BNP: BNP (last 3 results) Recent Labs    12/29/18 2152 01/11/19 1618  BNP 277.4* 132.1*    ProBNP (last 3 results) Recent Labs    11/27/18 1547  PROBNP 219.0*      Other results:  Imaging: Ct Abdomen Pelvis Wo Contrast  Result Date: 01/14/2019 CLINICAL DATA:  Pulmonary edema, atrial fibrillation and pericardial effusion. Evaluate for underlying malignancy. EXAM: CT CHEST, ABDOMEN AND PELVIS WITHOUT CONTRAST TECHNIQUE: Multidetector CT imaging of the chest, abdomen and pelvis was performed following the standard protocol without IV contrast. COMPARISON:  Chest radiographs 01/11/2019 FINDINGS: CT CHEST FINDINGS Cardiovascular: Left IJ central venous catheter extends to the superior cavoatrial junction. There is atherosclerosis of the aorta, great vessels and coronary arteries. No acute vascular findings are identified. The heart is mildly enlarged. There is a  small residual pericardial effusion. A pericardial drain is in place. Mediastinum/Nodes: There are no enlarged mediastinal, hilar or axillary lymph nodes. Hilar assessment is limited by the lack of intravenous contrast. The thyroid gland, trachea and esophagus appear unremarkable. Lungs/Pleura: There are small left-greater-than-right pleural effusions. There is mild dependent atelectasis in both lower lobes. There is also mild atelectasis in the lingula. No evidence endobronchial lesion or suspicious pulmonary nodule. Musculoskeletal/Chest wall: No chest wall mass or suspicious osseous findings. Mild thoracic spondylosis. CT ABDOMEN AND PELVIS FINDINGS Hepatobiliary: No focal hepatic abnormalities are identified on noncontrast imaging. There is no significant biliary dilatation post cholecystectomy. Pancreas: Unremarkable. No pancreatic ductal dilatation or surrounding inflammatory changes. Spleen: Normal in size without focal abnormality. Adrenals/Urinary Tract: There is a small left adrenal nodule measuring 18 mm and 28 HU on image 20/3. The right adrenal gland appears normal. The kidneys and ureters appear normal. There is no urinary tract calculus or hydronephrosis. Bladder is decompressed by a Foley catheter. Stomach/Bowel: No evidence of bowel wall thickening, distention or surrounding inflammatory change. Vascular/Lymphatic: There are no enlarged abdominal or pelvic lymph nodes. Moderate aortic and branch vessel atherosclerosis. No acute vascular findings on noncontrast imaging. Reproductive: Hysterectomy.  No adnexal mass. Other: There is a small amount of ascites, primarily in the pelvis. There is bilateral flank edema. No focal extraluminal fluid collection. The anterior abdominal wall appears intact. Musculoskeletal: No acute or significant osseous findings. Chronic degenerative disc disease at L5-S1. IMPRESSION: 1. No specific evidence of malignancy within the chest, abdomen or pelvis. 2. Small  indeterminate left adrenal nodule has a nonspecific density. Remote abdominal CT 05/06/2002 described a similar small nodule, such that this is likely an incidental adenoma. 3. Anasarca with bilateral pleural effusions, ascites and bilateral flank edema. Small pericardial effusion post pericardial drain placement. Associated bibasilar atelectasis. 4. Coronary and Aortic Atherosclerosis (ICD10-I70.0). Electronically Signed   By: Richardean Sale M.D.   On: 01/14/2019 15:09   Ct Chest Wo Contrast  Result Date: 01/14/2019 CLINICAL DATA:  Pulmonary edema, atrial fibrillation and pericardial effusion. Evaluate for underlying malignancy. EXAM: CT CHEST, ABDOMEN AND PELVIS WITHOUT CONTRAST TECHNIQUE: Multidetector CT imaging of the chest, abdomen and pelvis was performed following the standard protocol without IV contrast. COMPARISON:  Chest radiographs 01/11/2019 FINDINGS: CT CHEST FINDINGS Cardiovascular: Left IJ central venous catheter extends to the superior cavoatrial junction. There is atherosclerosis of the aorta, great vessels and coronary arteries. No acute vascular findings are identified. The heart is mildly enlarged. There is a small residual pericardial effusion. A pericardial drain is in place. Mediastinum/Nodes: There are no enlarged mediastinal, hilar or axillary lymph nodes.  Hilar assessment is limited by the lack of intravenous contrast. The thyroid gland, trachea and esophagus appear unremarkable. Lungs/Pleura: There are small left-greater-than-right pleural effusions. There is mild dependent atelectasis in both lower lobes. There is also mild atelectasis in the lingula. No evidence endobronchial lesion or suspicious pulmonary nodule. Musculoskeletal/Chest wall: No chest wall mass or suspicious osseous findings. Mild thoracic spondylosis. CT ABDOMEN AND PELVIS FINDINGS Hepatobiliary: No focal hepatic abnormalities are identified on noncontrast imaging. There is no significant biliary dilatation post  cholecystectomy. Pancreas: Unremarkable. No pancreatic ductal dilatation or surrounding inflammatory changes. Spleen: Normal in size without focal abnormality. Adrenals/Urinary Tract: There is a small left adrenal nodule measuring 18 mm and 28 HU on image 20/3. The right adrenal gland appears normal. The kidneys and ureters appear normal. There is no urinary tract calculus or hydronephrosis. Bladder is decompressed by a Foley catheter. Stomach/Bowel: No evidence of bowel wall thickening, distention or surrounding inflammatory change. Vascular/Lymphatic: There are no enlarged abdominal or pelvic lymph nodes. Moderate aortic and branch vessel atherosclerosis. No acute vascular findings on noncontrast imaging. Reproductive: Hysterectomy.  No adnexal mass. Other: There is a small amount of ascites, primarily in the pelvis. There is bilateral flank edema. No focal extraluminal fluid collection. The anterior abdominal wall appears intact. Musculoskeletal: No acute or significant osseous findings. Chronic degenerative disc disease at L5-S1. IMPRESSION: 1. No specific evidence of malignancy within the chest, abdomen or pelvis. 2. Small indeterminate left adrenal nodule has a nonspecific density. Remote abdominal CT 05/06/2002 described a similar small nodule, such that this is likely an incidental adenoma. 3. Anasarca with bilateral pleural effusions, ascites and bilateral flank edema. Small pericardial effusion post pericardial drain placement. Associated bibasilar atelectasis. 4. Coronary and Aortic Atherosclerosis (ICD10-I70.0). Electronically Signed   By: Richardean Sale M.D.   On: 01/14/2019 15:09     Medications:     Scheduled Medications: . Chlorhexidine Gluconate Cloth  6 each Topical Daily  . diltiazem  45 mg Oral Q6H  . gabapentin  300 mg Oral QHS  . HYDROcodone-acetaminophen  1 tablet Oral QHS  . insulin aspart  0-5 Units Subcutaneous QHS  . insulin aspart  0-9 Units Subcutaneous TID WC  .  latanoprost  1 drop Both Eyes QHS  . mouth rinse  15 mL Mouth Rinse BID  . Melatonin  9-15 mg Oral QHS  . multivitamins with iron  1 tablet Oral Daily  . pantoprazole (PROTONIX) IV  40 mg Intravenous Q12H  . rOPINIRole  2 mg Oral QHS  . senna  1 tablet Oral QHS  . sodium chloride flush  3 mL Intravenous Q12H  . ascorbic acid  1,000 mg Oral BID    Infusions: . sodium chloride    . cefTRIAXone (ROCEPHIN)  IV 1 g (01/14/19 2231)    PRN Medications: sodium chloride, acetaminophen, albuterol, guaiFENesin, LORazepam, ondansetron (ZOFRAN) IV, rOPINIRole, sodium chloride flush   Assessment/Plan:   1. Cardiogenic shock in setting of pericardial tamponade - echo  6/1 with EF 55-60% - s/p pericardiocentesis 6/2 - hemodynamics much improved - remains volume overloaded. With mild wheeze. Likely needs a dose of IV lasix today. Will d/w Renal   2. Large pericardial effusion suspect pericardial tamponade - s/p pericardiocentesis 6/2. With ~1L bloody fluid off - hemodynamics much improved - fluid analysis with 6,556 WBCs (85% polys). GS - No organisms. Cytology negative. LDH 1289 pH 7.0 -> exudative. ? Post-viral - CT C/A/P reviewed personally no evidence malignancy - ESR and auto-immune panels unrevealing so  far - Will pull drain today  3. AKI - improving post tap - baseline creatinine 0.8 ->0> 3.74 -> 3.06 -> 1.56 - suspect ATN due to hypotension. Now improving - Remains volume overloaded with CVP 15 Renal managiing. Seeabove  4. Hyponatremia - somewhat chronic. Hypervolemic. No evidence SIADH - improved to 124 today - Renal on board and managing  - Limit free water  5. Chronic AF - rate elevated. Restart diltiazem. Would keep off AC for a few weeks  6. Morbid obesity - will need weight loss  Ok to go to floor. D/w Dr. Florene Glen.   Length of Stay: 3   Glori Bickers MD 01/15/2019, 9:40 AM  Advanced Heart Failure Team Pager [phone] (M-F; 7a - 4p)  Please contact [place] [facility] for night-coverage after hours (4p -7a ) and weekends on [email or web address]

## 2019-01-15 NOTE — Progress Notes (Signed)
Subjective:  Remains in ICU- BP is better-  Had 3800 of UOP- Sodium up to 124. Has been more tachycardic however- a fib- started on dilt.  Pt wants to go home - is not seeing how sick she is    Objective Vital signs in last 24 hours: Vitals:   01/15/19 0530 01/15/19 0600 01/15/19 0630 01/15/19 0700  BP:  (!) 149/53 (!) 125/59 134/72  Pulse:  (!) 124 (!) 118 (!) 123  Resp:  16 14 13   Temp:      TempSrc:      SpO2:  96% (!) 88% 96%  Weight: 88.6 kg     Height:       Weight change: -2.2 kg  Intake/Output Summary (Last 24 hours) at 01/15/2019 0720 Last data filed at 01/15/2019 0700 Gross per 24 hour  Intake 658.22 ml  Output 3815 ml  Net -3156.78 ml    Assessment/Plan: 68 year old WF with DM, HTN, diastolic CHF-  Admitted with SOB- found to have a pericardial effusion as well as AKI and worsening hyponatremia from baseline  1.Renal- AKI that is most likely hemodynamic in the setting of hemodynamically significant pericardial effusion causing hypotension and being on an ACE.  Worsened quickly with minimal UOP and high K.  Now that pericardial effusion was drained on 6/2  seems that acute improvement in hemodynamics did lead to improvement.  Urine looks like UTI as well- sending culture - not sure what to make of hematuria  2.  Hyponatremia- appears somewhat chronic (has been 122-125 recently in April ) and likely related to heart failure- a hypervolemic hyponatremia.  Her urine osm is low appropriately so does not have SIADH.  Needs volume off to correct. - is trending better with autodiuresis only after ATN.  No meds for now 3. Hypertension/volume  - was hypotensive and at home on an ACE- now better after pericardiocentesis- autodiuresing 4. Anemia  - had been low around 9 - dropped acutely after procedure - supportive care for now.  Renal failure is not to a degree where I feel it is responsible for the anemia   5. Pericardial effusion - unknown etiology- work up pending - tachycardia-  started on dilt  6. Elevated LFT's - vascular congestion ?  But strange it developed later ?  7. Dispo- if it were only her AKI and sodium could probably go home on oral lasix but other things going on right now   [place]: Basic Metabolic Panel: Recent Labs  Lab 01/14/19 1810 01/15/19 0120 01/15/19 0344  NA 121* 124* 124*  K 3.9 3.5 3.7  CL 83* 88* 87*  CO2 24 23 24   GLUCOSE 219* 157* 148*  BUN 55* 47* 48*  CREATININE 2.19* 1.61* 1.56*  CALCIUM 8.7* 8.0* 8.4*   Liver Function Tests: Recent Labs  Lab 01/11/19 1608 01/15/19 0344  AST 22 365*  ALT 16 726*  ALKPHOS 76 93  BILITOT 0.7 0.9  PROT 6.8 6.1*  ALBUMIN 3.1* 3.0*   No results for input(s): LIPASE, AMYLASE in the last 168 hours. No results for input(s): AMMONIA in the last 168 hours. CBC: Recent Labs  Lab 01/11/19 1608 01/12/19 1458 01/12/19 2029 01/13/19 0457  01/14/19 0225 01/14/19 1102 01/15/19 0344  WBC 12.4* 16.7* 14.5* 17.7*  --  14.9*  --  11.9*  NEUTROABS 10.8*  --   --   --   --   --   --   --  HGB 8.7* 9.2* 9.0* 8.9*   < > 7.6* 7.8* 7.3*  HCT 27.5* 27.8* 27.3* 26.8*   < > 22.4* 23.6* 22.2*  MCV 75.5* 74.3* 73.6* 73.2*  --  72.7*  --  74.0*  PLT 384 511* 485* 493*  --  347  --  345   < > = values in this interval not displayed.   Cardiac Enzymes: Recent Labs  Lab 01/11/19 1608 01/11/19 2301 01/12/19 0254 01/12/19 0833  TROPONINI <0.03 <0.03 <0.03 <0.03   CBG: Recent Labs  Lab 01/14/19 0653 01/14/19 1109 01/14/19 1505 01/14/19 2153 01/15/19 0641  GLUCAP 103* 152* 92 185* 148*    Iron Studies: No results for input(s): IRON, TIBC, TRANSFERRIN, FERRITIN in the last 72 hours. Studies/Results: Ct Abdomen Pelvis Wo Contrast  Result Date: 01/14/2019 CLINICAL DATA:  Pulmonary edema, atrial fibrillation and pericardial effusion. Evaluate for underlying malignancy. EXAM: CT CHEST, ABDOMEN AND PELVIS WITHOUT CONTRAST TECHNIQUE: Multidetector CT imaging of the chest,  abdomen and pelvis was performed following the standard protocol without IV contrast. COMPARISON:  Chest radiographs 01/11/2019 FINDINGS: CT CHEST FINDINGS Cardiovascular: Left IJ central venous catheter extends to the superior cavoatrial junction. There is atherosclerosis of the aorta, great vessels and coronary arteries. No acute vascular findings are identified. The heart is mildly enlarged. There is a small residual pericardial effusion. A pericardial drain is in place. Mediastinum/Nodes: There are no enlarged mediastinal, hilar or axillary lymph nodes. Hilar assessment is limited by the lack of intravenous contrast. The thyroid gland, trachea and esophagus appear unremarkable. Lungs/Pleura: There are small left-greater-than-right pleural effusions. There is mild dependent atelectasis in both lower lobes. There is also mild atelectasis in the lingula. No evidence endobronchial lesion or suspicious pulmonary nodule. Musculoskeletal/Chest wall: No chest wall mass or suspicious osseous findings. Mild thoracic spondylosis. CT ABDOMEN AND PELVIS FINDINGS Hepatobiliary: No focal hepatic abnormalities are identified on noncontrast imaging. There is no significant biliary dilatation post cholecystectomy. Pancreas: Unremarkable. No pancreatic ductal dilatation or surrounding inflammatory changes. Spleen: Normal in size without focal abnormality. Adrenals/Urinary Tract: There is a small left adrenal nodule measuring 18 mm and 28 HU on image 20/3. The right adrenal gland appears normal. The kidneys and ureters appear normal. There is no urinary tract calculus or hydronephrosis. Bladder is decompressed by a Foley catheter. Stomach/Bowel: No evidence of bowel wall thickening, distention or surrounding inflammatory change. Vascular/Lymphatic: There are no enlarged abdominal or pelvic lymph nodes. Moderate aortic and branch vessel atherosclerosis. No acute vascular findings on noncontrast imaging. Reproductive: Hysterectomy.   No adnexal mass. Other: There is a small amount of ascites, primarily in the pelvis. There is bilateral flank edema. No focal extraluminal fluid collection. The anterior abdominal wall appears intact. Musculoskeletal: No acute or significant osseous findings. Chronic degenerative disc disease at L5-S1. IMPRESSION: 1. No specific evidence of malignancy within the chest, abdomen or pelvis. 2. Small indeterminate left adrenal nodule has a nonspecific density. Remote abdominal CT 05/06/2002 described a similar small nodule, such that this is likely an incidental adenoma. 3. Anasarca with bilateral pleural effusions, ascites and bilateral flank edema. Small pericardial effusion post pericardial drain placement. Associated bibasilar atelectasis. 4. Coronary and Aortic Atherosclerosis (ICD10-I70.0). Electronically Signed   By: Richardean Sale M.D.   On: 01/14/2019 15:09   Ct Chest Wo Contrast  Result Date: 01/14/2019 CLINICAL DATA:  Pulmonary edema, atrial fibrillation and pericardial effusion. Evaluate for underlying malignancy. EXAM: CT CHEST, ABDOMEN AND PELVIS WITHOUT CONTRAST TECHNIQUE: Multidetector CT imaging of the chest, abdomen and  pelvis was performed following the standard protocol without IV contrast. COMPARISON:  Chest radiographs 01/11/2019 FINDINGS: CT CHEST FINDINGS Cardiovascular: Left IJ central venous catheter extends to the superior cavoatrial junction. There is atherosclerosis of the aorta, great vessels and coronary arteries. No acute vascular findings are identified. The heart is mildly enlarged. There is a small residual pericardial effusion. A pericardial drain is in place. Mediastinum/Nodes: There are no enlarged mediastinal, hilar or axillary lymph nodes. Hilar assessment is limited by the lack of intravenous contrast. The thyroid gland, trachea and esophagus appear unremarkable. Lungs/Pleura: There are small left-greater-than-right pleural effusions. There is mild dependent atelectasis in  both lower lobes. There is also mild atelectasis in the lingula. No evidence endobronchial lesion or suspicious pulmonary nodule. Musculoskeletal/Chest wall: No chest wall mass or suspicious osseous findings. Mild thoracic spondylosis. CT ABDOMEN AND PELVIS FINDINGS Hepatobiliary: No focal hepatic abnormalities are identified on noncontrast imaging. There is no significant biliary dilatation post cholecystectomy. Pancreas: Unremarkable. No pancreatic ductal dilatation or surrounding inflammatory changes. Spleen: Normal in size without focal abnormality. Adrenals/Urinary Tract: There is a small left adrenal nodule measuring 18 mm and 28 HU on image 20/3. The right adrenal gland appears normal. The kidneys and ureters appear normal. There is no urinary tract calculus or hydronephrosis. Bladder is decompressed by a Foley catheter. Stomach/Bowel: No evidence of bowel wall thickening, distention or surrounding inflammatory change. Vascular/Lymphatic: There are no enlarged abdominal or pelvic lymph nodes. Moderate aortic and branch vessel atherosclerosis. No acute vascular findings on noncontrast imaging. Reproductive: Hysterectomy.  No adnexal mass. Other: There is a small amount of ascites, primarily in the pelvis. There is bilateral flank edema. No focal extraluminal fluid collection. The anterior abdominal wall appears intact. Musculoskeletal: No acute or significant osseous findings. Chronic degenerative disc disease at L5-S1. IMPRESSION: 1. No specific evidence of malignancy within the chest, abdomen or pelvis. 2. Small indeterminate left adrenal nodule has a nonspecific density. Remote abdominal CT 05/06/2002 described a similar small nodule, such that this is likely an incidental adenoma. 3. Anasarca with bilateral pleural effusions, ascites and bilateral flank edema. Small pericardial effusion post pericardial drain placement. Associated bibasilar atelectasis. 4. Coronary and Aortic Atherosclerosis (ICD10-I70.0).  Electronically Signed   By: Richardean Sale M.D.   On: 01/14/2019 15:09   Medications: Infusions: . sodium chloride    . cefTRIAXone (ROCEPHIN)  IV 1 g (01/14/19 2231)    Scheduled Medications: . Chlorhexidine Gluconate Cloth  6 each Topical Daily  . diltiazem  30 mg Oral Q6H  . gabapentin  300 mg Oral QHS  . HYDROcodone-acetaminophen  1 tablet Oral QHS  . insulin aspart  0-5 Units Subcutaneous QHS  . insulin aspart  0-9 Units Subcutaneous TID WC  . latanoprost  1 drop Both Eyes QHS  . mouth rinse  15 mL Mouth Rinse BID  . Melatonin  9-15 mg Oral QHS  . multivitamins with iron  1 tablet Oral Daily  . pantoprazole (PROTONIX) IV  40 mg Intravenous Q12H  . rOPINIRole  2 mg Oral QHS  . senna  1 tablet Oral QHS  . sodium chloride flush  3 mL Intravenous Q12H  . ascorbic acid  1,000 mg Oral BID    have reviewed scheduled and prn medications.  Physical Exam: General: NAD -wanting to go home  Heart: RRR Lungs: dec BS at bases  Abdomen: obese, soft  Extremities: pitting edema      01/15/2019,7:20 AM  LOS: 3 days

## 2019-01-15 NOTE — Consult Note (Signed)
   [facility] CM Inpatient Consult   01/15/2019  [name] February 01, 1951 [id number]    Patient screened for 22% high risk score of unplanned readmission and hospitalizations with her [employer] plan.   History and physical dated 01/13/19, reveals as:  [name] is a 68 y.o. female with a hx of chronic diastolic congestive heart failure,  Coronary artery disease, Diabetes mellitus,  Hypertension,  who was seen for the evaluation of acute exacerbation of her congestive heart failure (volume overloaded).   Review of recent Inpatient transition of care CM note, shows that discharge disposition is home with home with home health services (PT, OT- [facility]). She lives at home with spouse and two daughters in their twenties.   Noted that patient is currently listed as an Engaged [facility] patient. She will be followed by [facility] in the community, with full case management services.    [facility] care management services are not appropriate at this time.    Will sign off.     For questions and additional information, please contact:   Hildagarde Holleran A. Houa Ackert, BSN, RN-BC [facility] Cell: [phone]

## 2019-01-16 ENCOUNTER — Other Ambulatory Visit: Payer: Self-pay | Admitting: Cardiology

## 2019-01-16 DIAGNOSIS — I3139 Other pericardial effusion (noninflammatory): Secondary | ICD-10-CM

## 2019-01-16 DIAGNOSIS — I313 Pericardial effusion (noninflammatory): Secondary | ICD-10-CM

## 2019-01-16 DIAGNOSIS — I314 Cardiac tamponade: Secondary | ICD-10-CM

## 2019-01-16 LAB — COMPREHENSIVE METABOLIC PANEL
ALT: 562 U/L — ABNORMAL HIGH (ref 0–44)
AST: 198 U/L — ABNORMAL HIGH (ref 15–41)
Albumin: 3.1 g/dL — ABNORMAL LOW (ref 3.5–5.0)
Alkaline Phosphatase: 95 U/L (ref 38–126)
Anion gap: 10 (ref 5–15)
BUN: 32 mg/dL — ABNORMAL HIGH (ref 8–23)
CO2: 27 mmol/L (ref 22–32)
Calcium: 8.7 mg/dL — ABNORMAL LOW (ref 8.9–10.3)
Chloride: 89 mmol/L — ABNORMAL LOW (ref 98–111)
Creatinine, Ser: 1.07 mg/dL — ABNORMAL HIGH (ref 0.44–1.00)
GFR calc Af Amer: >60 mL/min (ref >60)
GFR calc non Af Amer: 53 mL/min — ABNORMAL LOW (ref >60)
Glucose, Bld: 173 mg/dL — ABNORMAL HIGH (ref 70–99)
Potassium: 4 mmol/L (ref 3.5–5.1)
Sodium: 126 mmol/L — ABNORMAL LOW (ref 135–145)
Total Bilirubin: 0.9 mg/dL (ref 0.3–1.2)
Total Protein: 6.7 g/dL (ref 6.5–8.1)

## 2019-01-16 LAB — GLUCOSE, CAPILLARY
Glucose-Capillary: 179 mg/dL — ABNORMAL HIGH (ref 70–99)
Glucose-Capillary: 172 mg/dL — ABNORMAL HIGH (ref 70–99)

## 2019-01-16 LAB — BODY FLUID CULTURE: Culture: NO GROWTH

## 2019-01-16 LAB — HEPATITIS PANEL, ACUTE
HCV Ab: <0.1 s/co ratio (ref 0.0–0.9)
Hep A IgM: NEGATIVE
Hep B C IgM: NEGATIVE
Hepatitis B Surface Ag: NEGATIVE

## 2019-01-16 LAB — CBC
HCT: 23.4 % — ABNORMAL LOW (ref 36.0–46.0)
Hemoglobin: 7.5 g/dL — ABNORMAL LOW (ref 12.0–15.0)
MCH: 24.5 pg — ABNORMAL LOW (ref 26.0–34.0)
MCHC: 32.1 g/dL (ref 30.0–36.0)
MCV: 76.5 fL — ABNORMAL LOW (ref 80.0–100.0)
Platelets: 342 10*3/uL (ref 150–400)
RBC: 3.06 MIL/uL — ABNORMAL LOW (ref 3.87–5.11)
RDW: 20.6 % — ABNORMAL HIGH (ref 11.5–15.5)
WBC: 8.8 10*3/uL (ref 4.0–10.5)
nRBC: 0 % (ref 0.0–0.2)

## 2019-01-16 LAB — ANCA TITERS
Atypical P-ANCA titer: <1:20 {titer}
C-ANCA: <1:20 {titer}
P-ANCA: <1:20 {titer}

## 2019-01-16 LAB — MAGNESIUM: Magnesium: 2.1 mg/dL (ref 1.7–2.4)

## 2019-01-16 MED ORDER — TORSEMIDE 20 MG PO TABS
40.0000 mg | ORAL_TABLET | Freq: Once | ORAL | Status: AC
  Administered 2019-01-16: 40 mg via ORAL
  Filled 2019-01-16: qty 2

## 2019-01-16 MED ORDER — TORSEMIDE 20 MG PO TABS: 40.0000 mg | ORAL_TABLET | Freq: Two times a day (BID) | ORAL | Status: DC

## 2019-01-16 MED ORDER — CIPROFLOXACIN HCL 500 MG PO TABS
250.0000 mg | ORAL_TABLET | Freq: Two times a day (BID) | ORAL | Status: DC
  Administered 2019-01-16: 250 mg via ORAL
  Filled 2019-01-16: qty 1

## 2019-01-16 MED ORDER — METOPROLOL SUCCINATE ER 100 MG PO TB24
100.0000 mg | ORAL_TABLET | Freq: Every day | ORAL | Status: DC
  Administered 2019-01-16: 100 mg via ORAL
  Filled 2019-01-16: qty 1

## 2019-01-16 MED ORDER — METOPROLOL SUCCINATE ER 25 MG PO TB24: 25.0000 mg | ORAL_TABLET | ORAL | Status: DC | PRN

## 2019-01-16 MED ORDER — POTASSIUM CHLORIDE ER 10 MEQ PO CPCR: 20.0000 meq | ORAL_CAPSULE | Freq: Every day | ORAL | 0 refills | Status: DC

## 2019-01-16 MED ORDER — DILTIAZEM HCL ER COATED BEADS 240 MG PO CP24: 240.0000 mg | ORAL_CAPSULE | Freq: Every day | ORAL | Status: DC

## 2019-01-16 MED ORDER — TORSEMIDE 20 MG PO TABS: 40.0000 mg | ORAL_TABLET | Freq: Two times a day (BID) | ORAL | 3 refills | Status: DC

## 2019-01-16 MED ORDER — CIPROFLOXACIN HCL 250 MG PO TABS: 250.0000 mg | ORAL_TABLET | Freq: Two times a day (BID) | ORAL | 0 refills | Status: AC

## 2019-01-16 MED ORDER — POTASSIUM CHLORIDE CRYS ER 20 MEQ PO TBCR
40.0000 meq | EXTENDED_RELEASE_TABLET | Freq: Once | ORAL | Status: AC
  Administered 2019-01-16: 40 meq via ORAL
  Filled 2019-01-16: qty 2

## 2019-01-16 NOTE — Progress Notes (Signed)
OT Cancellation Note  Patient Details Name: [name] MRN: [id number] DOB: 04-16-1951   Cancelled Treatment:    Reason Eval/Treat Not Completed: Other (comment)(pt preparing for discharge). Pt sitting EOB and fully dressed. RN in room preparing pt for discharge. Pt reports she feels comfortable going home and got dressed by herself.   Darrol Jump OTR/L 01/16/2019, 4:01 PM

## 2019-01-16 NOTE — Progress Notes (Signed)
Advanced Heart Failure Rounding Note   Subjective:    Eager to go home. Feeling better but still feel bloated. No orthopnea or PND. Diuresing well.   Objective:   Weight Range:  Vital Signs:   Temp:  [98.1 F (36.7 C)-100.2 F (37.9 C)] 98.1 F (36.7 C) (06/05 0641) Pulse Rate:  [66-125] 94 (06/05 0641) Resp:  [16-18] 16 (06/04 1700) BP: (117-132)/(40-66) 125/66 (06/05 0641) SpO2:  [66 %-98 %] 94 % (06/05 0641) Weight:  [88.6 kg] 88.6 kg (06/05 0641) Last BM Date: 01/13/19  Weight change: Filed Weights   01/14/19 0600 01/15/19 0530 01/16/19 0641  Weight: 90.8 kg 88.6 kg 88.6 kg    Intake/Output:   Intake/Output Summary (Last 24 hours) at 01/16/2019 1301 Last data filed at 01/16/2019 1128 Gross per 24 hour  Intake 480 ml  Output 1900 ml  Net -1420 ml     Physical Exam: General:  Well appearing. No resp difficulty HEENT: normal Neck: supple. JVP to jaw Carotids 2+ bilat; no bruits. No lymphadenopathy or thryomegaly appreciated. Cor: PMI nondisplaced. Irregular rate & rhythm. No rubs, gallops or murmurs. Lungs: clear Abdomen: obese soft, nontender, nondistended. No hepatosplenomegaly. No bruits or masses. Good bowel sounds. Extremities: no cyanosis, clubbing, rash, 2-3+  Edema l>R Neuro: alert & orientedx3, cranial nerves grossly intact. moves all 4 extremities w/o difficulty. Affect pleasant   Telemetry: AF 90-105 Personally reviewed   Labs: Basic Metabolic Panel: Recent Labs  Lab 01/14/19 1102 01/14/19 1810 01/15/19 0120 01/15/19 0344 01/16/19 0613  NA 120* 121* 124* 124* 126*  K 3.9 3.9 3.5 3.7 4.0  CL 81* 83* 88* 87* 89*  CO2 _0 GLUCOSE 177* 219* 157* 148* 173*  BUN 58* 55* 47* 48* 32*  CREATININE 2.56* 2.19* 1.61* 1.56* 1.07*  CALCIUM 8.6* 8.7* 8.0* 8.4* 8.7*  MG  --   --   --  1.7 2.1    Liver Function Tests: Recent Labs  Lab 01/11/19 1608 01/15/19 0344 01/16/19 0613  AST 22 365* 198*  ALT 16 726* 562*  ALKPHOS 76 93 95   BILITOT 0.7 0.9 0.9  PROT 6.8 6.1* 6.7  ALBUMIN 3.1* 3.0* 3.1*   No results for input(s): LIPASE, AMYLASE in the last 168 hours. No results for input(s): AMMONIA in the last 168 hours.  CBC: Recent Labs  Lab 01/11/19 1608  01/12/19 2029 01/13/19 0457  01/13/19 1052 01/14/19 0225 01/14/19 1102 01/15/19 0344 01/16/19 0613  WBC 12.4*   < > 14.5* 17.7*  --   --  14.9*  --  11.9* 8.8  NEUTROABS 10.8*  --   --   --   --   --   --   --   --   --   HGB 8.7*   < > 9.0* 8.9*   < > 8.2* 7.6* 7.8* 7.3* 7.5*  HCT 27.5*   < > 27.3* 26.8*   < > 24.0* 22.4* 23.6* 22.2* 23.4*  MCV 75.5*   < > 73.6* 73.2*  --   --  72.7*  --  74.0* 76.5*  PLT 384   < > 485* 493*  --   --  347  --  345 342   < > = values in this interval not displayed.    Cardiac Enzymes: Recent Labs  Lab 01/11/19 1608 01/11/19 2301 01/12/19 0254 01/12/19 0833  TROPONINI <0.03 <0.03 <0.03 <0.03    BNP: BNP (last 3 results) Recent Labs  12/29/18 2152 01/11/19 1618  BNP 277.4* 132.1*    ProBNP (last 3 results) Recent Labs    11/27/18 1547  PROBNP 219.0*      Other results:  Imaging: Ct Abdomen Pelvis Wo Contrast  Result Date: 01/14/2019 CLINICAL DATA:  Pulmonary edema, atrial fibrillation and pericardial effusion. Evaluate for underlying malignancy. EXAM: CT CHEST, ABDOMEN AND PELVIS WITHOUT CONTRAST TECHNIQUE: Multidetector CT imaging of the chest, abdomen and pelvis was performed following the standard protocol without IV contrast. COMPARISON:  Chest radiographs 01/11/2019 FINDINGS: CT CHEST FINDINGS Cardiovascular: Left IJ central venous catheter extends to the superior cavoatrial junction. There is atherosclerosis of the aorta, great vessels and coronary arteries. No acute vascular findings are identified. The heart is mildly enlarged. There is a small residual pericardial effusion. A pericardial drain is in place. Mediastinum/Nodes: There are no enlarged mediastinal, hilar or axillary lymph nodes. Hilar  assessment is limited by the lack of intravenous contrast. The thyroid gland, trachea and esophagus appear unremarkable. Lungs/Pleura: There are small left-greater-than-right pleural effusions. There is mild dependent atelectasis in both lower lobes. There is also mild atelectasis in the lingula. No evidence endobronchial lesion or suspicious pulmonary nodule. Musculoskeletal/Chest wall: No chest wall mass or suspicious osseous findings. Mild thoracic spondylosis. CT ABDOMEN AND PELVIS FINDINGS Hepatobiliary: No focal hepatic abnormalities are identified on noncontrast imaging. There is no significant biliary dilatation post cholecystectomy. Pancreas: Unremarkable. No pancreatic ductal dilatation or surrounding inflammatory changes. Spleen: Normal in size without focal abnormality. Adrenals/Urinary Tract: There is a small left adrenal nodule measuring 18 mm and 28 HU on image 20/3. The right adrenal gland appears normal. The kidneys and ureters appear normal. There is no urinary tract calculus or hydronephrosis. Bladder is decompressed by a Foley catheter. Stomach/Bowel: No evidence of bowel wall thickening, distention or surrounding inflammatory change. Vascular/Lymphatic: There are no enlarged abdominal or pelvic lymph nodes. Moderate aortic and branch vessel atherosclerosis. No acute vascular findings on noncontrast imaging. Reproductive: Hysterectomy.  No adnexal mass. Other: There is a small amount of ascites, primarily in the pelvis. There is bilateral flank edema. No focal extraluminal fluid collection. The anterior abdominal wall appears intact. Musculoskeletal: No acute or significant osseous findings. Chronic degenerative disc disease at L5-S1. IMPRESSION: 1. No specific evidence of malignancy within the chest, abdomen or pelvis. 2. Small indeterminate left adrenal nodule has a nonspecific density. Remote abdominal CT 05/06/2002 described a similar small nodule, such that this is likely an incidental  adenoma. 3. Anasarca with bilateral pleural effusions, ascites and bilateral flank edema. Small pericardial effusion post pericardial drain placement. Associated bibasilar atelectasis. 4. Coronary and Aortic Atherosclerosis (ICD10-I70.0). Electronically Signed   By: Richardean Sale M.D.   On: 01/14/2019 15:09   Ct Chest Wo Contrast  Result Date: 01/14/2019 CLINICAL DATA:  Pulmonary edema, atrial fibrillation and pericardial effusion. Evaluate for underlying malignancy. EXAM: CT CHEST, ABDOMEN AND PELVIS WITHOUT CONTRAST TECHNIQUE: Multidetector CT imaging of the chest, abdomen and pelvis was performed following the standard protocol without IV contrast. COMPARISON:  Chest radiographs 01/11/2019 FINDINGS: CT CHEST FINDINGS Cardiovascular: Left IJ central venous catheter extends to the superior cavoatrial junction. There is atherosclerosis of the aorta, great vessels and coronary arteries. No acute vascular findings are identified. The heart is mildly enlarged. There is a small residual pericardial effusion. A pericardial drain is in place. Mediastinum/Nodes: There are no enlarged mediastinal, hilar or axillary lymph nodes. Hilar assessment is limited by the lack of intravenous contrast. The thyroid gland, trachea and esophagus appear unremarkable.  Lungs/Pleura: There are small left-greater-than-right pleural effusions. There is mild dependent atelectasis in both lower lobes. There is also mild atelectasis in the lingula. No evidence endobronchial lesion or suspicious pulmonary nodule. Musculoskeletal/Chest wall: No chest wall mass or suspicious osseous findings. Mild thoracic spondylosis. CT ABDOMEN AND PELVIS FINDINGS Hepatobiliary: No focal hepatic abnormalities are identified on noncontrast imaging. There is no significant biliary dilatation post cholecystectomy. Pancreas: Unremarkable. No pancreatic ductal dilatation or surrounding inflammatory changes. Spleen: Normal in size without focal abnormality.  Adrenals/Urinary Tract: There is a small left adrenal nodule measuring 18 mm and 28 HU on image 20/3. The right adrenal gland appears normal. The kidneys and ureters appear normal. There is no urinary tract calculus or hydronephrosis. Bladder is decompressed by a Foley catheter. Stomach/Bowel: No evidence of bowel wall thickening, distention or surrounding inflammatory change. Vascular/Lymphatic: There are no enlarged abdominal or pelvic lymph nodes. Moderate aortic and branch vessel atherosclerosis. No acute vascular findings on noncontrast imaging. Reproductive: Hysterectomy.  No adnexal mass. Other: There is a small amount of ascites, primarily in the pelvis. There is bilateral flank edema. No focal extraluminal fluid collection. The anterior abdominal wall appears intact. Musculoskeletal: No acute or significant osseous findings. Chronic degenerative disc disease at L5-S1. IMPRESSION: 1. No specific evidence of malignancy within the chest, abdomen or pelvis. 2. Small indeterminate left adrenal nodule has a nonspecific density. Remote abdominal CT 05/06/2002 described a similar small nodule, such that this is likely an incidental adenoma. 3. Anasarca with bilateral pleural effusions, ascites and bilateral flank edema. Small pericardial effusion post pericardial drain placement. Associated bibasilar atelectasis. 4. Coronary and Aortic Atherosclerosis (ICD10-I70.0). Electronically Signed   By: Richardean Sale M.D.   On: 01/14/2019 15:09     Medications:     Scheduled Medications:  Chlorhexidine Gluconate Cloth  6 each Topical Daily   ciprofloxacin  250 mg Oral BID   [START ON 01/17/2019] diltiazem  240 mg Oral Daily   gabapentin  300 mg Oral QHS   HYDROcodone-acetaminophen  1 tablet Oral QHS   insulin aspart  0-5 Units Subcutaneous QHS   insulin aspart  0-9 Units Subcutaneous TID WC   latanoprost  1 drop Both Eyes QHS   mouth rinse  15 mL Mouth Rinse BID   Melatonin  9-15 mg Oral QHS    metoprolol succinate  100 mg Oral Daily   multivitamins with iron  1 tablet Oral Daily   pantoprazole  40 mg Oral Daily   rOPINIRole  2 mg Oral QHS   senna  1 tablet Oral QHS   sodium chloride flush  3 mL Intravenous Q12H   torsemide  40 mg Oral BID   ascorbic acid  1,000 mg Oral BID    Infusions:  sodium chloride     cefTRIAXone (ROCEPHIN)  IV 1 g (01/15/19 2356)    PRN Medications: sodium chloride, acetaminophen, albuterol, guaiFENesin, LORazepam, ondansetron (ZOFRAN) IV, rOPINIRole, sodium chloride flush   Assessment/Plan:   1. Cardiogenic shock in setting of pericardial tamponade - echo  6/1 with EF 55-60% - s/p pericardiocentesis 6/2 - hemodynamics much improved - remains volume overloaded but diuresing. D/w Dr. Florene Glen at the bedside. Will give her torsemide 17m now. If responds can go home today with 40 bid for 2-3 days then back to 20 bid. Adjust as needed. Kcl 20 bid while on higher dose of torsemide then drop to 20 daily - f/u HF clinic next week with labs   2. Large pericardial effusion suspect pericardial tamponade -  s/p pericardiocentesis 6/2. With ~1L bloody fluid off . Drain now out  - hemodynamics much improved - fluid analysis with 6,556 WBCs (85% polys). GS - No organisms. Cytology negative. LDH 1289 pH 7.0 -> exudative. ? Post-viral - CT C/A/P reviewed personally no evidence malignancy - ESR and auto-immune panels unrevealing so far - Repeat echo 2-3 months   3. AKI - resolved  4. Hyponatremia - somewhat chronic. Hypervolemic. No evidence SIADH - improving - Limit free water  5. Chronic AF - rate improving. Restart home meds  6. Morbid obesity - will need weight loss   Length of Stay: 4   Laconya Clere MD 01/16/2019, 1:01 PM  Advanced Heart Failure Team Pager [phone] (M-F; [place])  Please contact [facility] for night-coverage after hours (4p -7a ) and weekends on [email or web address]

## 2019-01-16 NOTE — Care Management Important Message (Signed)
Important Message  Patient Details  Name: [name] MRN: [id number] Date of Birth: 11-06-1950   Medicare Important Message Given:  Yes    [name] 01/16/2019, 12:38 PM

## 2019-01-16 NOTE — Progress Notes (Signed)
Subjective:  Moved to floor bed-  2 liters of urine- crt down to 1.07- sodium up to 126   Objective Vital signs in last 24 hours: Vitals:   01/15/19 1700 01/15/19 2118 01/16/19 0112 01/16/19 0641  BP:  (!) 117/40  125/66  Pulse:  (!) 125 66 94  Resp: 16     Temp:  100.2 F (37.9 C) 98.1 F (36.7 C) 98.1 F (36.7 C)  TempSrc:  Oral Oral Oral  SpO2:   (!) 66% 94%  Weight:    88.6 kg  Height:       Weight change: -0.012 kg  Intake/Output Summary (Last 24 hours) at 01/16/2019 1036 Last data filed at 01/16/2019 0900 Gross per 24 hour  Intake 490 ml  Output 1800 ml  Net -1310 ml    Assessment/Plan: 68 year old WF with DM, HTN, diastolic CHF-  Admitted with SOB- found to have a pericardial effusion as well as AKI and worsening hyponatremia from baseline  1.Renal- AKI that is most likely hemodynamic in the setting of hemodynamically significant pericardial effusion causing hypotension and being on an ACE.  Worsened quickly with minimal UOP and high K.  Now that pericardial effusion was drained on 6/2  seems that acute improvement in hemodynamics did lead to improvement.  Urine looks like UTI - e coli - will give cipro.  AKI resolved 2.  Hyponatremia- appears somewhat chronic (has been 122-125 recently in April ) and likely related to heart failure- a hypervolemic hyponatremia.  Her urine osm is low appropriately so does not have SIADH.  Needs volume off to correct. - is trending better with autodiuresis.  Now UOP has dropped off will put back on her home dose of torsemide- was on 40 BID   3. Hypertension/volume  - was hypotensive and at home on an ACE- now better after pericardiocentesis- autodiuresing 4. Anemia  - had been low around 9 - dropped acutely after procedure - supportive care for now.  Renal failure is not to a degree where I feel it is responsible for the anemia   5. Pericardial effusion - unknown etiology- work up pending - tachycardia- started on dilt  6. Elevated LFT's -  vascular congestion ?  But strange it developed later ?  7. Dispo- renal will sign off- will give short course of cipro for UTI-  Sodium should continue to correct slowly with diuresis- she does not need nephrology specific follow up    [place]: Basic Metabolic Panel: Recent Labs  Lab 01/15/19 0120 01/15/19 0344 01/16/19 0613  NA 124* 124* 126*  K 3.5 3.7 4.0  CL 88* 87* 89*  CO2 23 24 27   GLUCOSE 157* 148* 173*  BUN 47* 48* 32*  CREATININE 1.61* 1.56* 1.07*  CALCIUM 8.0* 8.4* 8.7*   Liver Function Tests: Recent Labs  Lab 01/11/19 1608 01/15/19 0344 01/16/19 0613  AST 22 365* 198*  ALT 16 726* 562*  ALKPHOS 76 93 95  BILITOT 0.7 0.9 0.9  PROT 6.8 6.1* 6.7  ALBUMIN 3.1* 3.0* 3.1*   No results for input(s): LIPASE, AMYLASE in the last 168 hours. No results for input(s): AMMONIA in the last 168 hours. CBC: Recent Labs  Lab 01/11/19 1608  01/12/19 2029 01/13/19 0457  01/14/19 0225 01/14/19 1102 01/15/19 0344 01/16/19 0613  WBC 12.4*   < > 14.5* 17.7*  --  14.9*  --  11.9* 8.8  NEUTROABS 10.8*  --   --   --   --   --   --   --   --  HGB 8.7*   < > 9.0* 8.9*   < > 7.6* 7.8* 7.3* 7.5*  HCT 27.5*   < > 27.3* 26.8*   < > 22.4* 23.6* 22.2* 23.4*  MCV 75.5*   < > 73.6* 73.2*  --  72.7*  --  74.0* 76.5*  PLT 384   < > 485* 493*  --  347  --  345 342   < > = values in this interval not displayed.   Cardiac Enzymes: Recent Labs  Lab 01/11/19 1608 01/11/19 2301 01/12/19 0254 01/12/19 0833  TROPONINI <0.03 <0.03 <0.03 <0.03   CBG: Recent Labs  Lab 01/15/19 0641 01/15/19 1141 01/15/19 1627 01/15/19 2143 01/16/19 0751  GLUCAP 148* 172* 172* 187* 179*    Iron Studies:  Recent Labs    01/15/19 1004  IRON 17*  TIBC 374  FERRITIN 852*   Studies/Results: Ct Abdomen Pelvis Wo Contrast  Result Date: 01/14/2019 CLINICAL DATA:  Pulmonary edema, atrial fibrillation and pericardial effusion. Evaluate for underlying malignancy. EXAM: CT  CHEST, ABDOMEN AND PELVIS WITHOUT CONTRAST TECHNIQUE: Multidetector CT imaging of the chest, abdomen and pelvis was performed following the standard protocol without IV contrast. COMPARISON:  Chest radiographs 01/11/2019 FINDINGS: CT CHEST FINDINGS Cardiovascular: Left IJ central venous catheter extends to the superior cavoatrial junction. There is atherosclerosis of the aorta, great vessels and coronary arteries. No acute vascular findings are identified. The heart is mildly enlarged. There is a small residual pericardial effusion. A pericardial drain is in place. Mediastinum/Nodes: There are no enlarged mediastinal, hilar or axillary lymph nodes. Hilar assessment is limited by the lack of intravenous contrast. The thyroid gland, trachea and esophagus appear unremarkable. Lungs/Pleura: There are small left-greater-than-right pleural effusions. There is mild dependent atelectasis in both lower lobes. There is also mild atelectasis in the lingula. No evidence endobronchial lesion or suspicious pulmonary nodule. Musculoskeletal/Chest wall: No chest wall mass or suspicious osseous findings. Mild thoracic spondylosis. CT ABDOMEN AND PELVIS FINDINGS Hepatobiliary: No focal hepatic abnormalities are identified on noncontrast imaging. There is no significant biliary dilatation post cholecystectomy. Pancreas: Unremarkable. No pancreatic ductal dilatation or surrounding inflammatory changes. Spleen: Normal in size without focal abnormality. Adrenals/Urinary Tract: There is a small left adrenal nodule measuring 18 mm and 28 HU on image 20/3. The right adrenal gland appears normal. The kidneys and ureters appear normal. There is no urinary tract calculus or hydronephrosis. Bladder is decompressed by a Foley catheter. Stomach/Bowel: No evidence of bowel wall thickening, distention or surrounding inflammatory change. Vascular/Lymphatic: There are no enlarged abdominal or pelvic lymph nodes. Moderate aortic and branch vessel  atherosclerosis. No acute vascular findings on noncontrast imaging. Reproductive: Hysterectomy.  No adnexal mass. Other: There is a small amount of ascites, primarily in the pelvis. There is bilateral flank edema. No focal extraluminal fluid collection. The anterior abdominal wall appears intact. Musculoskeletal: No acute or significant osseous findings. Chronic degenerative disc disease at L5-S1. IMPRESSION: 1. No specific evidence of malignancy within the chest, abdomen or pelvis. 2. Small indeterminate left adrenal nodule has a nonspecific density. Remote abdominal CT 05/06/2002 described a similar small nodule, such that this is likely an incidental adenoma. 3. Anasarca with bilateral pleural effusions, ascites and bilateral flank edema. Small pericardial effusion post pericardial drain placement. Associated bibasilar atelectasis. 4. Coronary and Aortic Atherosclerosis (ICD10-I70.0). Electronically Signed   By: Richardean Sale M.D.   On: 01/14/2019 15:09   Ct Chest Wo Contrast  Result Date: 01/14/2019 CLINICAL DATA:  Pulmonary edema, atrial fibrillation and pericardial  effusion. Evaluate for underlying malignancy. EXAM: CT CHEST, ABDOMEN AND PELVIS WITHOUT CONTRAST TECHNIQUE: Multidetector CT imaging of the chest, abdomen and pelvis was performed following the standard protocol without IV contrast. COMPARISON:  Chest radiographs 01/11/2019 FINDINGS: CT CHEST FINDINGS Cardiovascular: Left IJ central venous catheter extends to the superior cavoatrial junction. There is atherosclerosis of the aorta, great vessels and coronary arteries. No acute vascular findings are identified. The heart is mildly enlarged. There is a small residual pericardial effusion. A pericardial drain is in place. Mediastinum/Nodes: There are no enlarged mediastinal, hilar or axillary lymph nodes. Hilar assessment is limited by the lack of intravenous contrast. The thyroid gland, trachea and esophagus appear unremarkable. Lungs/Pleura:  There are small left-greater-than-right pleural effusions. There is mild dependent atelectasis in both lower lobes. There is also mild atelectasis in the lingula. No evidence endobronchial lesion or suspicious pulmonary nodule. Musculoskeletal/Chest wall: No chest wall mass or suspicious osseous findings. Mild thoracic spondylosis. CT ABDOMEN AND PELVIS FINDINGS Hepatobiliary: No focal hepatic abnormalities are identified on noncontrast imaging. There is no significant biliary dilatation post cholecystectomy. Pancreas: Unremarkable. No pancreatic ductal dilatation or surrounding inflammatory changes. Spleen: Normal in size without focal abnormality. Adrenals/Urinary Tract: There is a small left adrenal nodule measuring 18 mm and 28 HU on image 20/3. The right adrenal gland appears normal. The kidneys and ureters appear normal. There is no urinary tract calculus or hydronephrosis. Bladder is decompressed by a Foley catheter. Stomach/Bowel: No evidence of bowel wall thickening, distention or surrounding inflammatory change. Vascular/Lymphatic: There are no enlarged abdominal or pelvic lymph nodes. Moderate aortic and branch vessel atherosclerosis. No acute vascular findings on noncontrast imaging. Reproductive: Hysterectomy.  No adnexal mass. Other: There is a small amount of ascites, primarily in the pelvis. There is bilateral flank edema. No focal extraluminal fluid collection. The anterior abdominal wall appears intact. Musculoskeletal: No acute or significant osseous findings. Chronic degenerative disc disease at L5-S1. IMPRESSION: 1. No specific evidence of malignancy within the chest, abdomen or pelvis. 2. Small indeterminate left adrenal nodule has a nonspecific density. Remote abdominal CT 05/06/2002 described a similar small nodule, such that this is likely an incidental adenoma. 3. Anasarca with bilateral pleural effusions, ascites and bilateral flank edema. Small pericardial effusion post pericardial drain  placement. Associated bibasilar atelectasis. 4. Coronary and Aortic Atherosclerosis (ICD10-I70.0). Electronically Signed   By: Richardean Sale M.D.   On: 01/14/2019 15:09   Medications: Infusions: . sodium chloride    . cefTRIAXone (ROCEPHIN)  IV 1 g (01/15/19 2356)    Scheduled Medications: . Chlorhexidine Gluconate Cloth  6 each Topical Daily  . [START ON 01/17/2019] diltiazem  240 mg Oral Daily  . gabapentin  300 mg Oral QHS  . HYDROcodone-acetaminophen  1 tablet Oral QHS  . insulin aspart  0-5 Units Subcutaneous QHS  . insulin aspart  0-9 Units Subcutaneous TID WC  . latanoprost  1 drop Both Eyes QHS  . mouth rinse  15 mL Mouth Rinse BID  . Melatonin  9-15 mg Oral QHS  . multivitamins with iron  1 tablet Oral Daily  . pantoprazole  40 mg Oral Daily  . rOPINIRole  2 mg Oral QHS  . senna  1 tablet Oral QHS  . sodium chloride flush  3 mL Intravenous Q12H  . torsemide  40 mg Oral BID  . ascorbic acid  1,000 mg Oral BID    have reviewed scheduled and prn medications.  Physical Exam: General: NAD -wanting to go home  Heart: RRR  Lungs: dec BS at bases  Abdomen: obese, soft  Extremities: pitting edema      01/16/2019,10:36 AM  LOS: 4 days

## 2019-01-16 NOTE — Progress Notes (Signed)
Progress Note  Patient Name: KWYNN SCHLOTTER Date of Encounter: 01/16/2019  Primary Cardiologist: Quay Burow, MD   Subjective   68 yo with cardiogenic shock related to pericardial tamponade.  S/p pericardiocentesis - 1 liter of bloody effusion .  ? Post viral effusin ,  Cytology negative  Drain was pulled on 6/4.  Feels well   Inpatient Medications    Scheduled Meds: . Chlorhexidine Gluconate Cloth  6 each Topical Daily  . diltiazem  180 mg Oral Daily  . gabapentin  300 mg Oral QHS  . HYDROcodone-acetaminophen  1 tablet Oral QHS  . insulin aspart  0-5 Units Subcutaneous QHS  . insulin aspart  0-9 Units Subcutaneous TID WC  . latanoprost  1 drop Both Eyes QHS  . mouth rinse  15 mL Mouth Rinse BID  . Melatonin  9-15 mg Oral QHS  . multivitamins with iron  1 tablet Oral Daily  . pantoprazole  40 mg Oral Daily  . rOPINIRole  2 mg Oral QHS  . senna  1 tablet Oral QHS  . sodium chloride flush  3 mL Intravenous Q12H  . ascorbic acid  1,000 mg Oral BID   Continuous Infusions: . sodium chloride    . cefTRIAXone (ROCEPHIN)  IV 1 g (01/15/19 2356)   PRN Meds: sodium chloride, acetaminophen, albuterol, guaiFENesin, LORazepam, ondansetron (ZOFRAN) IV, rOPINIRole, sodium chloride flush   Vital Signs    Vitals:   01/15/19 1700 01/15/19 2118 01/16/19 0112 01/16/19 0641  BP:  (!) 117/40  125/66  Pulse:  (!) 125 66 94  Resp: 16     Temp:  100.2 F (37.9 C) 98.1 F (36.7 C) 98.1 F (36.7 C)  TempSrc:  Oral Oral Oral  SpO2:   (!) 66% 94%  Weight:    88.6 kg  Height:        Intake/Output Summary (Last 24 hours) at 01/16/2019 1000 Last data filed at 01/16/2019 0900 Gross per 24 hour  Intake 490 ml  Output 1800 ml  Net -1310 ml   Last 3 Weights 01/16/2019 01/15/2019 01/14/2019  Weight (lbs) 195 lb 4.8 oz 195 lb 5.2 oz 200 lb 2.8 oz  Weight (kg) 88.587 kg 88.6 kg 90.8 kg      Telemetry    Afib with HR 115  - Personally Reviewed  ECG     - Personally Reviewed  Physical  Exam   GEN:  elderly female,  NAD ,  Slight dyspnea with activity . Neck: No JVD Cardiac: Irreg. Irreg. ,  Bandage from pericardiocentesis is clean and dry  Respiratory: Clear to auscultation bilaterally. GI: Soft, nontender, non-distended  MS: No edema; No deformity. Neuro:  Nonfocal  Psych: Normal affect   Labs    Chemistry Recent Labs  Lab 01/11/19 1608  01/15/19 0120 01/15/19 0344 01/16/19 0613  NA 120*   < > 124* 124* 126*  K 4.5   < > 3.5 3.7 4.0  CL 80*   < > 88* 87* 89*  CO2 21*   < > 23 24 27   GLUCOSE 217*   < > 157* 148* 173*  BUN 33*   < > 47* 48* 32*  CREATININE 1.44*   < > 1.61* 1.56* 1.07*  CALCIUM 9.4   < > 8.0* 8.4* 8.7*  PROT 6.8  --   --  6.1* 6.7  ALBUMIN 3.1*  --   --  3.0* 3.1*  AST 22  --   --  365* 198*  ALT  16  --   --  726* 562*  ALKPHOS 76  --   --  93 95  BILITOT 0.7  --   --  0.9 0.9  GFRNONAA 37*   < > 32* 33* 53*  GFRAA 43*   < > 37* 39* >60  ANIONGAP 19*   < > 13 13 10    < > = values in this interval not displayed.     Hematology Recent Labs  Lab 01/14/19 0225 01/14/19 1102 01/15/19 0344 01/16/19 0613  WBC 14.9*  --  11.9* 8.8  RBC 3.08*  --  3.00* 3.06*  HGB 7.6* 7.8* 7.3* 7.5*  HCT 22.4* 23.6* 22.2* 23.4*  MCV 72.7*  --  74.0* 76.5*  MCH 24.7*  --  24.3* 24.5*  MCHC 33.9  --  32.9 32.1  RDW 19.4*  --  20.0* 20.6*  PLT 347  --  345 342    Cardiac Enzymes Recent Labs  Lab 01/11/19 1608 01/11/19 2301 01/12/19 0254 01/12/19 0833  TROPONINI <0.03 <0.03 <0.03 <0.03   No results for input(s): TROPIPOC in the last 168 hours.   BNP Recent Labs  Lab 01/11/19 1618  BNP 132.1*     DDimer No results for input(s): DDIMER in the last 168 hours.   Radiology    Ct Abdomen Pelvis Wo Contrast  Result Date: 01/14/2019 CLINICAL DATA:  Pulmonary edema, atrial fibrillation and pericardial effusion. Evaluate for underlying malignancy. EXAM: CT CHEST, ABDOMEN AND PELVIS WITHOUT CONTRAST TECHNIQUE: Multidetector CT imaging of the  chest, abdomen and pelvis was performed following the standard protocol without IV contrast. COMPARISON:  Chest radiographs 01/11/2019 FINDINGS: CT CHEST FINDINGS Cardiovascular: Left IJ central venous catheter extends to the superior cavoatrial junction. There is atherosclerosis of the aorta, great vessels and coronary arteries. No acute vascular findings are identified. The heart is mildly enlarged. There is a small residual pericardial effusion. A pericardial drain is in place. Mediastinum/Nodes: There are no enlarged mediastinal, hilar or axillary lymph nodes. Hilar assessment is limited by the lack of intravenous contrast. The thyroid gland, trachea and esophagus appear unremarkable. Lungs/Pleura: There are small left-greater-than-right pleural effusions. There is mild dependent atelectasis in both lower lobes. There is also mild atelectasis in the lingula. No evidence endobronchial lesion or suspicious pulmonary nodule. Musculoskeletal/Chest wall: No chest wall mass or suspicious osseous findings. Mild thoracic spondylosis. CT ABDOMEN AND PELVIS FINDINGS Hepatobiliary: No focal hepatic abnormalities are identified on noncontrast imaging. There is no significant biliary dilatation post cholecystectomy. Pancreas: Unremarkable. No pancreatic ductal dilatation or surrounding inflammatory changes. Spleen: Normal in size without focal abnormality. Adrenals/Urinary Tract: There is a small left adrenal nodule measuring 18 mm and 28 HU on image 20/3. The right adrenal gland appears normal. The kidneys and ureters appear normal. There is no urinary tract calculus or hydronephrosis. Bladder is decompressed by a Foley catheter. Stomach/Bowel: No evidence of bowel wall thickening, distention or surrounding inflammatory change. Vascular/Lymphatic: There are no enlarged abdominal or pelvic lymph nodes. Moderate aortic and branch vessel atherosclerosis. No acute vascular findings on noncontrast imaging. Reproductive:  Hysterectomy.  No adnexal mass. Other: There is a small amount of ascites, primarily in the pelvis. There is bilateral flank edema. No focal extraluminal fluid collection. The anterior abdominal wall appears intact. Musculoskeletal: No acute or significant osseous findings. Chronic degenerative disc disease at L5-S1. IMPRESSION: 1. No specific evidence of malignancy within the chest, abdomen or pelvis. 2. Small indeterminate left adrenal nodule has a nonspecific density. Remote abdominal CT  05/06/2002 described a similar small nodule, such that this is likely an incidental adenoma. 3. Anasarca with bilateral pleural effusions, ascites and bilateral flank edema. Small pericardial effusion post pericardial drain placement. Associated bibasilar atelectasis. 4. Coronary and Aortic Atherosclerosis (ICD10-I70.0). Electronically Signed   By: Richardean Sale M.D.   On: 01/14/2019 15:09   Ct Chest Wo Contrast  Result Date: 01/14/2019 CLINICAL DATA:  Pulmonary edema, atrial fibrillation and pericardial effusion. Evaluate for underlying malignancy. EXAM: CT CHEST, ABDOMEN AND PELVIS WITHOUT CONTRAST TECHNIQUE: Multidetector CT imaging of the chest, abdomen and pelvis was performed following the standard protocol without IV contrast. COMPARISON:  Chest radiographs 01/11/2019 FINDINGS: CT CHEST FINDINGS Cardiovascular: Left IJ central venous catheter extends to the superior cavoatrial junction. There is atherosclerosis of the aorta, great vessels and coronary arteries. No acute vascular findings are identified. The heart is mildly enlarged. There is a small residual pericardial effusion. A pericardial drain is in place. Mediastinum/Nodes: There are no enlarged mediastinal, hilar or axillary lymph nodes. Hilar assessment is limited by the lack of intravenous contrast. The thyroid gland, trachea and esophagus appear unremarkable. Lungs/Pleura: There are small left-greater-than-right pleural effusions. There is mild dependent  atelectasis in both lower lobes. There is also mild atelectasis in the lingula. No evidence endobronchial lesion or suspicious pulmonary nodule. Musculoskeletal/Chest wall: No chest wall mass or suspicious osseous findings. Mild thoracic spondylosis. CT ABDOMEN AND PELVIS FINDINGS Hepatobiliary: No focal hepatic abnormalities are identified on noncontrast imaging. There is no significant biliary dilatation post cholecystectomy. Pancreas: Unremarkable. No pancreatic ductal dilatation or surrounding inflammatory changes. Spleen: Normal in size without focal abnormality. Adrenals/Urinary Tract: There is a small left adrenal nodule measuring 18 mm and 28 HU on image 20/3. The right adrenal gland appears normal. The kidneys and ureters appear normal. There is no urinary tract calculus or hydronephrosis. Bladder is decompressed by a Foley catheter. Stomach/Bowel: No evidence of bowel wall thickening, distention or surrounding inflammatory change. Vascular/Lymphatic: There are no enlarged abdominal or pelvic lymph nodes. Moderate aortic and branch vessel atherosclerosis. No acute vascular findings on noncontrast imaging. Reproductive: Hysterectomy.  No adnexal mass. Other: There is a small amount of ascites, primarily in the pelvis. There is bilateral flank edema. No focal extraluminal fluid collection. The anterior abdominal wall appears intact. Musculoskeletal: No acute or significant osseous findings. Chronic degenerative disc disease at L5-S1. IMPRESSION: 1. No specific evidence of malignancy within the chest, abdomen or pelvis. 2. Small indeterminate left adrenal nodule has a nonspecific density. Remote abdominal CT 05/06/2002 described a similar small nodule, such that this is likely an incidental adenoma. 3. Anasarca with bilateral pleural effusions, ascites and bilateral flank edema. Small pericardial effusion post pericardial drain placement. Associated bibasilar atelectasis. 4. Coronary and Aortic Atherosclerosis  (ICD10-I70.0). Electronically Signed   By: Richardean Sale M.D.   On: 01/14/2019 15:09    Cardiac Studies     Patient Profile    68 y.o. female with atrial fib, admitted with cardiogenic shock due to pericardial effusion  Assessment & Plan    1.   Pericardial tamponade:   Better after pericardiocentesis.    Likely post viral ,   Has diuresed 10.8 liters so far  Will restart home dose of torsemide 40 bid   Will follow up with Dr. Gwenlyn Found or APP.   Will need an in office visit as well as an echo   2.  Atrial fib:   HR is still a bit elevated.   Will increase Diltiazem to 240 CD  a day .  Will wait and start Xarelto after office visit with Dr. Gwenlyn Found or APP .  3. DM :  Plans per IM          For questions or updates, please contact [place] Please consult [email or web address] for contact info under        Signed, Mertie Moores, MD  01/16/2019, 10:00 AM

## 2019-01-16 NOTE — Discharge Summary (Signed)
Physician Discharge Summary  [name] ZMO:[id number] DOB: [birth year] DOA: 01/11/2019  PCP: Hoyt Koch, MD  Admit date: 01/11/2019 Discharge date: 01/16/2019  Time spent: 40 minutes  Recommendations for Outpatient Follow-up:  1. Follow up outpatient CBC/CMP 2. Follow volume status, d/c'd on torsemide 40 mg BID x 2-3 days, then resume 20 mg BID.  Follow volume status and kidney function, lytes, adjust as needed. 3. Follow up with cardiology outpatient for instructions on resumption of eliquis, repeat echo, etc  4. Discharged on cipro for e. Coli UTI, follow final sensitivities (pending at time of discharge) 5. Follow for resumption of ace inhibitor 6. Follow elevated LFT's to ensure continued improvement (statin currently on hold until further improvement) - w/u further as needed 7. Will need continued follow up of anemia, iron def anemia, w/u further as appropriate - would start iron in follow up   Discharge Diagnoses:  Principal Problem:   Acute on chronic diastolic CHF (congestive heart failure) (HCC) Active Problems:   Type 2 diabetes mellitus with vascular disease ([place])   Anemia   Essential hypertension   CAD S/P percutaneous coronary angioplasty   COPD mixed type (HCC)   Permanent atrial fibrillation   Hyponatremia   CHF (congestive heart failure) (HCC)   Bradycardia   Pericardial effusion   Diarrhea   Shortness of breath   Acute renal failure (HCC)   BRBPR (bright red blood per rectum)   Cardiac tamponade   Pericardial tamponade   Discharge Condition: stable  Diet recommendation: heart healthy  Filed Weights   01/14/19 0600 01/15/19 0530 01/16/19 0641  Weight: 90.8 kg 88.6 kg 88.6 kg    History of present illness:  68 y.o.femalewith [name] Past Medical History of chronic hyponatremia, Dm2, HTN, who presented with volume overload 2/2 cardiogenic shock in the setting of pericardial tamponade.  She's now s/p pericardiocentesis with improvement.  Continue  diuresis at discharge.  Pericardial effusion suspected to be post viral.    See below for additional details Hospital Course:  Cardiogenic Shock in setting of pericardial tamponade  Heart Failure Exacerbation:  Echo 6/1 with EF 55-60% (see report) S/p pericardiocentesis on 6/2 S/p RHC with evidence of pericardial tamponade and post tap with pulmonary venous HTN Diuresis per cardiology and renal Follow up w/u of effusion (RF negative, ANA negative, ANCA negative, CRP - 9.6, ESR - 35, HIV - negative) Suspected to be post viral  CT chest abdomen pelvis without evidence of malignancy - was notable for anasarca. Discharged on 40 mg torsemide BID for 2-3 days, then 20 mg BID per HF team (see note)  Acute Kidney Injury: baseline creatinine < 1.  Thought to be hemodynamically mediated in setting of pericardial effusion with tamponade. Peaked at3.74 Now improving Follow I/O, daily weights Appreciate nephrology c/s Holding ACEi  at this time  Elevated Liver Enzymes: Possibly hepatic congestion with above.  Will continue to monitor.  Follow acute hepatitis panel (negative).  Abdominal imaging without focal hepatic abnormalities.  Hyponatremia: Some chronicity to this.  Worsened to 113.  Now improving. S/p lasix and tolvaptan Urine sodium 6/1 23, urine osm 258   In setting of above, suspected due to hypervolemic hyponatremia Appreciate nephrology recs, continue diuresis as outpatient   Type 2 diabetes mellitus with vascular disease (HCC)serum glucose 217 on admission. A1C 6.5 2/20. Home meds include invokana, janumet. Resume home meds  Essential hypertension- hypotension has improved after pericardiocentesis.   Continue to hold ace I for now.  Resume beta blocker and  Diltiazem has been restarted.   - continue to monitor, resume as tolerated  Diarrhea  BRBPR. Concern for BRBPR, but now appears to be resolved.  FOBT was negative and Hb stable.  Sounds like she had skin tear which  may have been bleeding, she has less concern for BRBPR when we reviewed this.   -hold xarelto for now as above -hold celebrex -PPI -GI path panel negative - Hb stable at discharge  Anemia related to chronic blood loss  Iron deficiency Anemia.  Continue to monitor Downtrend since pericardiocentesis, will continue to closely monitor Labs suggestive of AOCD with iron deficiency Transfuse for Hb <7   CAD S/P percutaneous coronary angioplasty-stable  COPD mixed type (HCC)-compensated and stable at this time lung exam clear  Permanent atrial fibrillation with RVR- cards recommending holding anticoagulation for [name] few weeks given pericardial effusion with bloody fluid.  Resume home dose of diltiazem  E. 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  Follow outpatient    Procedures: Right and L H Cath, pericardiocentesis Findings:  Pre-pericardiocentesis  Ao = 120/59 (75) LV = 120/36 RA = 36 RV = 72/40 PA = 74/36 (57) PCW = 44 Fick cardiac output/index = 5.6/2.9 PVR = 2.2 WU FA sat = 100% PA sat = 56% +RV-LV interaction + equalization of diastolic pressures  Post-tap  RA = 18 RV = 75/15 PA = 76/24 (48) PCWP = 26 Fick 8.5/4.4 PVR = 2.5 WU  Assessment: 1. Evidence of pericardial tamponade  2. Post-tap has persistent pulmonary venous HTN  Will work-up etiology of effusion and continue to manage HF medically   Pericardiocentesis 6/2  1. Cardiac tamponade 2. Successful pericardiocentesis with removal of 900 cc of bloody fluid  Recommendations: Will leave drain in place for at least 25 hours. Fluid studies to be sent to lab.   Echo 6/1  IMPRESSIONS   1. The left ventricle has normal systolic function, with an ejection fraction of 55-60%. The cavity size was normal. Left ventricular diastolic function could not be evaluated secondary to atrial fibrillation. There is constrictive  physiology. 2. The right ventricle has normal systolic function. The cavity was normal. There is no increase in right ventricular wall thickness. 3. Left atrial size was moderately dilated. 4. Right atrial size was moderately dilated. 5. Large pericardial effusion. 6. The pericardial effusion is circumferential. 7. There is [name] large circumfrential echolucent pericaradial effusion without frank evidence of tamponade. 8. Aortic valve regurgitation is trivial by color flow Doppler. No stenosis of the aortic valve. 9. The aortic root and ascending aorta are normal in size and structure. 10. The inferior vena cava was dilated in size with <50% respiratory variability. 11. The interatrial septum was not well visualized.  Consultations:  cardiology  Discharge Exam: Vitals:   01/16/19 0112 01/16/19 0641  BP:  125/66  Pulse: 66 94  Resp:    Temp: 98.1 F (36.7 C) 98.1 F (36.7 C)  SpO2: (!) 66% 94%   Ready to go Does not want to Morrison longer Discussed discharge plan  General: No acute distress. Cardiovascular: irreg, irregular Lungs: Clear to auscultation bilaterally Abdomen: Soft, nontender, nondistended Neurological: Alert and oriented 3. Moves all extremities 4 . Cranial nerves II through XII grossly intact. Skin: Warm and dry. No rashes or lesions. Extremities: No clubbing or cyanosis. No edema.  Psychiatric: Mood and affect are normal. Insight and judgment are appropriate.  Discharge Instructions   Discharge Instructions    (  HEART FAILURE PATIENTS) Call MD:  Anytime you have any of the following symptoms: 1) 3 pound weight gain in 24 hours or 5 pounds in 1 week 2) shortness of breath, with or without [name] dry hacking cough 3) swelling in the hands, feet or stomach 4) if you have to sleep on extra pillows at night in order to breathe.   Complete by:  As directed    Call MD for:  difficulty breathing, headache or visual disturbances   Complete by:  As directed    Call  MD for:  extreme fatigue   Complete by:  As directed    Call MD for:  hives   Complete by:  As directed    Call MD for:  persistant dizziness or light-headedness   Complete by:  As directed    Call MD for:  persistant nausea and vomiting   Complete by:  As directed    Call MD for:  redness, tenderness, or signs of infection (pain, swelling, redness, odor or green/yellow discharge around incision site)   Complete by:  As directed    Call MD for:  severe uncontrolled pain   Complete by:  As directed    Call MD for:  temperature >100.4   Complete by:  As directed    Diet - low sodium heart healthy   Complete by:  As directed    Discharge instructions   Complete by:  As directed    You were seen for heart failure due to [name] pericardial effusion.  The effusion was removed and you've improved.     You still have some extra fluid on you and will need continued diuresis with torsemide 40 mg twice daily for another 3 days.  After this, you can resume 20 mg of torsemide twice daily.  While you take the higher dose of torsemide (40 mg twice daily), take 40 meq of potassium daily.  When you go back to the 20 mg of torsemide twice daily, take 20 meq of potassium daily.    You need to schedule [name] follow up with cardiology as soon as possible for [name] repeat echo.  You'll also need repeat labs.    Please do not take your xarelto until your cardiologist clears you to restart this.  Resume your home metoprolol and diltiazem.  Do not take your quinapril until you follow up with your PCP or cardiologist for repeat labs.  Your liver enzymes were elevated.  Please do not take your crestor until you get follow up liver tests to ensure this is continuing to improve.  You have [name] urinary tract infection.  We've started you on ciprofloxacin.  Your culture sensitivities are still pending at this time.  Please follow up with your PCP to ensure this antibiotic adequately covers this infection.  An adrenal nodule was  seen on imaging, please follow this as an outpatient.   Return for new, recurrent, or worsening symptoms.  Please ask your PCP to request records from this hospitalization so they know what was done and what the next steps will be.   Face-to-face encounter (required for Medicare/Medicaid patients)   Complete by:  As directed    I [name] certify that this patient is under my care and that I, or [name] nurse practitioner or physician's assistant working with me, had [name] face-to-face encounter that meets the physician face-to-face encounter requirements with this patient on 01/16/2019. The encounter with the patient was in whole, or in part for the following  medical condition(s) which is the primary reason for home health care (List medical condition): heart failure   The encounter with the patient was in whole, or in part, for the following medical condition, which is the primary reason for home health care:  heart failure   I certify that, based on my findings, the following services are medically necessary home health services:  Physical therapy   Reason for Medically Necessary Home Health Services:  Therapy- Therapeutic Exercises to Increase Strength and Endurance   My clinical findings support the need for the above services:  Unable to leave home safely without assistance and/or assistive device   Further, I certify that my clinical findings support that this patient is homebound due to:  Unable to leave home safely without assistance   Home Health   Complete by:  As directed    To provide the following care/treatments:   PT OT     Increase activity slowly   Complete by:  As directed      Allergies as of 01/16/2019      Reactions   Benadryl [diphenhydramine Hcl] Other (See Comments)   Restless leg   Clopidogrel Bisulfate Nausea Only   Nausea & pain   Contrast Media [iodinated Diagnostic Agents] Palpitations   Rapid heart rate, hot      Medication List    STOP taking these  medications   celecoxib 200 MG capsule Commonly known as:  CeleBREX   quinapril 40 MG tablet Commonly known as:  ACCUPRIL   rosuvastatin 10 MG tablet Commonly known as:  CRESTOR   Xarelto 20 MG Tabs tablet Generic drug:  rivaroxaban     TAKE these medications   ascorbic acid 1000 MG tablet Commonly known as:  VITAMIN C Take 1,000 mg by mouth 2 (two) times daily.   CALCIUM-MAG-VIT C-VIT D PO Take 1 tablet by mouth daily.   ciprofloxacin 250 MG tablet Commonly known as:  CIPRO Take 1 tablet (250 mg total) by mouth 2 (two) times daily for 5 days.   diltiazem 180 MG 24 hr capsule Commonly known as:  CARDIZEM CD TAKE 1 CAPSULE DAILY.   FreeStyle Optician, dispensing Use to monitor sugars   gabapentin 100 MG capsule Commonly known as:  NEURONTIN TAKE (1) CAPSULE THREE TIMES DAILY. What changed:  See the new instructions.   guaiFENesin 600 MG 12 hr tablet Commonly known as:  MUCINEX Take 600 mg by mouth 2 (two) times daily as needed for cough.   HYDROcodone-acetaminophen 5-325 MG tablet Commonly known as:  NORCO/VICODIN Take 1 tablet by mouth at bedtime.   Invokana 100 MG Tabs tablet Generic drug:  canagliflozin TAKE 1 TABLET ONCE DAILY. What changed:    how much to take  when to take this   isosorbide mononitrate 30 MG 24 hr tablet Commonly known as:  IMDUR Take 0.5 tablets (15 mg total) by mouth daily.   latanoprost 0.005 % ophthalmic solution Commonly known as:  XALATAN Place 1 drop into both eyes at bedtime.   LORazepam 0.5 MG tablet Commonly known as:  ATIVAN TAKE 1 OR 2 TABLETS AT BEDTIME. What changed:  See the new instructions.   Melatonin 5 MG Tabs Take 2-3 tablets by mouth at bedtime.   metoprolol succinate 50 MG 24 hr tablet Commonly known as:  TOPROL-XL Take 100 mg by mouth daily.   metoprolol succinate 25 MG 24 hr tablet Commonly known as:  TOPROL-XL Take 1 tablet (25 mg total) by mouth as needed  for up to 30 days. Take as needed  for palpitations   multivitamins with iron Tabs tablet Take 1 tablet by mouth daily.   nystatin ointment Commonly known as:  MYCOSTATIN Apply 1 application topically 2 (two) times daily as needed (IRRITATION).   omeprazole 20 MG capsule Commonly known as:  PRILOSEC Take 1 capsule (20 mg total) by mouth 2 (two) times daily before [name] meal.   OneTouch Delica Lancets Fine Misc 1 Units by Does not apply route as directed.   PARoxetine 10 MG tablet Commonly known as:  PAXIL TAKE 2 AND 1/2 TABS DAILY What changed:  See the new instructions.   potassium chloride 10 MEQ CR capsule Commonly known as:  MICRO-K Take 2 capsules (20 mEq total) by mouth daily for 30 days.   rOPINIRole 0.5 MG tablet Commonly known as:  REQUIP Take 1 to 2 tab at midnight. ( XL form at 6 PM) What changed:    how much to take  how to take this  when to take this  reasons to take this  additional instructions   rOPINIRole 2 MG 24 hr tablet Commonly known as:  REQUIP XL TAKE ONE TABLET AT BEDTIME. What changed:  Another medication with the same name was changed. Make sure you understand how and when to take each.   senna 8.6 MG Tabs tablet Commonly known as:  SENOKOT Take 1 tablet by mouth at bedtime.   sitaGLIPtin-metformin 50-1000 MG tablet Commonly known as:  Janumet TAKE 1/2 TABLET IN THE MORNING WITH BREAKFAST AND 1 TABLET IN THE EVENING WITH SUPPER. What changed:    how much to take  how to take this  when to take this   Tiotropium Bromide-Olodaterol 2.5-2.5 MCG/ACT Aers Commonly known as:  Stiolto Respimat Inhale 2 puffs into the lungs daily.   torsemide 20 MG tablet Commonly known as:  DEMADEX Take 2 tablets (40 mg total) by mouth 2 (two) times daily. (after 2-3 days, please resume 20 mg twice daily) What changed:  additional instructions      Allergies  Allergen Reactions  . Benadryl [Diphenhydramine Hcl] Other (See Comments)    Restless leg  . Clopidogrel Bisulfate  Nausea Only    Nausea & pain  . Contrast Media [Iodinated Diagnostic Agents] Palpitations    Rapid heart rate, hot   Follow-up Information    [facility] Follow up.   Specialty:  Home Health Services Why:  [place], [place] information: [address] [place] [address] [phone]        Janice Colonel, NP Follow up on 02/02/2019.   Specialties:  Nurse Practitioner, Radiology, Cardiology Why:  3:30 PM (Dr. Kennon Holter NP). you will also need to get [name] repeat echo (heart ultrasound) the week prior to this visit. our office will call you to arrange that appointment  Contact information: [place] [id number] [place] Follow up on 01/16/2019.   Specialty:  Cardiology Why:  At 10:00 Located on the 1st floor of [facility]. Garage Code 8007 Contact information: [address] [phone] [place] [phone]           The results of significant diagnostics from this hospitalization (including imaging, microbiology, ancillary and laboratory) are listed below for reference.    Significant Diagnostic Studies: Ct Abdomen Pelvis Wo Contrast  Result Date: 01/14/2019 CLINICAL DATA:  Pulmonary  edema, atrial fibrillation and pericardial effusion. Evaluate for underlying malignancy. EXAM: CT CHEST, ABDOMEN AND PELVIS WITHOUT CONTRAST TECHNIQUE: Multidetector CT imaging of the chest, abdomen and pelvis was performed following the standard protocol without IV contrast. COMPARISON:  Chest radiographs 01/11/2019 FINDINGS: CT CHEST FINDINGS Cardiovascular: Left IJ central venous catheter extends to the superior cavoatrial junction. There is atherosclerosis of the aorta, great vessels and coronary arteries. No acute vascular findings are identified. The heart is mildly enlarged. There is Mabry Tift small residual pericardial effusion. Taggart Prasad pericardial drain is in  place. Mediastinum/Nodes: There are no enlarged mediastinal, hilar or axillary lymph nodes. Hilar assessment is limited by the lack of intravenous contrast. The thyroid gland, trachea and esophagus appear unremarkable. Lungs/Pleura: There are small left-greater-than-right pleural effusions. There is mild dependent atelectasis in both lower lobes. There is also mild atelectasis in the lingula. No evidence endobronchial lesion or suspicious pulmonary nodule. Musculoskeletal/Chest wall: No chest wall mass or suspicious osseous findings. Mild thoracic spondylosis. CT ABDOMEN AND PELVIS FINDINGS Hepatobiliary: No focal hepatic abnormalities are identified on noncontrast imaging. There is no significant biliary dilatation post cholecystectomy. Pancreas: Unremarkable. No pancreatic ductal dilatation or surrounding inflammatory changes. Spleen: Normal in size without focal abnormality. Adrenals/Urinary Tract: There is Sutton Plake small left adrenal nodule measuring 18 mm and 28 HU on image 20/3. The right adrenal gland appears normal. The kidneys and ureters appear normal. There is no urinary tract calculus or hydronephrosis. Bladder is decompressed by [name] catheter. Stomach/Bowel: No evidence of bowel wall thickening, distention or surrounding inflammatory change. Vascular/Lymphatic: There are no enlarged abdominal or pelvic lymph nodes. Moderate aortic and branch vessel atherosclerosis. No acute vascular findings on noncontrast imaging. Reproductive: Hysterectomy.  No adnexal mass. Other: There is Belvin Gauss small amount of ascites, primarily in the pelvis. There is bilateral flank edema. No focal extraluminal fluid collection. The anterior abdominal wall appears intact. Musculoskeletal: No acute or significant osseous findings. Chronic degenerative disc disease at L5-S1. IMPRESSION: 1. No specific evidence of malignancy within the chest, abdomen or pelvis. 2. Small indeterminate left adrenal nodule has Madilynne Mullan nonspecific density. Remote  abdominal CT 05/06/2002 described Jazmin Ley similar small nodule, such that this is likely an incidental adenoma. 3. Anasarca with bilateral pleural effusions, ascites and bilateral flank edema. Small pericardial effusion post pericardial drain placement. Associated bibasilar atelectasis. 4. Coronary and Aortic Atherosclerosis (ICD10-I70.0). Electronically Signed   By: Richardean Sale M.D.   On: 01/14/2019 15:09   Dg Chest 2 View  Result Date: 12/29/2018 CLINICAL DATA:  68 year old female with shortness of breath and swelling. EXAM: CHEST - 2 VIEW COMPARISON:  12/04/2017 and earlier. FINDINGS: Stable cardiac size at the upper limits of normal to mildly enlarged. Calcified aortic atherosclerosis. Other mediastinal contours are within normal limits. Visualized tracheal air column is within normal limits. Mild acute on chronic increased interstitial markings in both lungs, mild basilar predominance. No pleural fluid, pneumothorax or consolidation. No acute osseous abnormality identified. Paucity of bowel gas in the upper abdomen. IMPRESSION: Mild acute pulmonary interstitial opacity. Consider mild or developing interstitial edema versus viral/atypical respiratory infection. No pleural fluid. Electronically Signed   By: Genevie Ann M.D.   On: 12/29/2018 23:26   Ct Chest Wo Contrast  Result Date: 01/14/2019 CLINICAL DATA:  Pulmonary edema, atrial fibrillation and pericardial effusion. Evaluate for underlying malignancy. EXAM: CT CHEST, ABDOMEN AND PELVIS WITHOUT CONTRAST TECHNIQUE: Multidetector CT imaging of the chest, abdomen and pelvis was performed following the standard protocol without IV contrast. COMPARISON:  Chest radiographs 01/11/2019  FINDINGS: CT CHEST FINDINGS Cardiovascular: Left IJ central venous catheter extends to the superior cavoatrial junction. There is atherosclerosis of the aorta, great vessels and coronary arteries. No acute vascular findings are identified. The heart is mildly enlarged. There is Fionnuala Hemmerich  small residual pericardial effusion. Tammala Weider pericardial drain is in place. Mediastinum/Nodes: There are no enlarged mediastinal, hilar or axillary lymph nodes. Hilar assessment is limited by the lack of intravenous contrast. The thyroid gland, trachea and esophagus appear unremarkable. Lungs/Pleura: There are small left-greater-than-right pleural effusions. There is mild dependent atelectasis in both lower lobes. There is also mild atelectasis in the lingula. No evidence endobronchial lesion or suspicious pulmonary nodule. Musculoskeletal/Chest wall: No chest wall mass or suspicious osseous findings. Mild thoracic spondylosis. CT ABDOMEN AND PELVIS FINDINGS Hepatobiliary: No focal hepatic abnormalities are identified on noncontrast imaging. There is no significant biliary dilatation post cholecystectomy. Pancreas: Unremarkable. No pancreatic ductal dilatation or surrounding inflammatory changes. Spleen: Normal in size without focal abnormality. Adrenals/Urinary Tract: There is Malon Branton small left adrenal nodule measuring 18 mm and 28 HU on image 20/3. The right adrenal gland appears normal. The kidneys and ureters appear normal. There is no urinary tract calculus or hydronephrosis. Bladder is decompressed by Loredana Medellin Morrison catheter. Stomach/Bowel: No evidence of bowel wall thickening, distention or surrounding inflammatory change. Vascular/Lymphatic: There are no enlarged abdominal or pelvic lymph nodes. Moderate aortic and branch vessel atherosclerosis. No acute vascular findings on noncontrast imaging. Reproductive: Hysterectomy.  No adnexal mass. Other: There is Clemons Salvucci small amount of ascites, primarily in the pelvis. There is bilateral flank edema. No focal extraluminal fluid collection. The anterior abdominal wall appears intact. Musculoskeletal: No acute or significant osseous findings. Chronic degenerative disc disease at L5-S1. IMPRESSION: 1. No specific evidence of malignancy within the chest, abdomen or pelvis. 2. Small  indeterminate left adrenal nodule has Fumi Guadron nonspecific density. Remote abdominal CT 05/06/2002 described Jaydee Ingman similar small nodule, such that this is likely an incidental adenoma. 3. Anasarca with bilateral pleural effusions, ascites and bilateral flank edema. Small pericardial effusion post pericardial drain placement. Associated bibasilar atelectasis. 4. Coronary and Aortic Atherosclerosis (ICD10-I70.0). Electronically Signed   By: Richardean Sale M.D.   On: 01/14/2019 15:09   Dg Chest Portable 1 View  Result Date: 01/11/2019 CLINICAL DATA:  Stable enlarged cardiac silhouette. EXAM: PORTABLE CHEST 1 VIEW COMPARISON:  12/29/2018 FINDINGS: Cardiac silhouette is enlarged compared to prior. No effusion, infiltrate or pneumothorax. No acute osseous abnormality. IMPRESSION: Enlarged cardiac silhouette representing either new cardiomegaly or pericardial effusion. Electronically Signed   By: Suzy Bouchard M.D.   On: 01/11/2019 16:57    Microbiology: Recent Results (from the past 240 hour(s))  SARS Coronavirus 2 (CEPHEID- Performed in [place] hospital lab), Hosp Order     Status: None   Collection Time: 01/11/19  4:18 PM  Result Value Ref Range Status   SARS Coronavirus 2 NEGATIVE NEGATIVE Final    Comment: (NOTE) If result is NEGATIVE SARS-CoV-2 target nucleic acids are NOT DETECTED. The SARS-CoV-2 RNA is generally detectable in upper and lower  respiratory specimens during the acute phase of infection. The lowest  concentration of SARS-CoV-2 viral copies this assay can detect is 250  copies / mL. Quanetta Truss negative result does not preclude SARS-CoV-2 infection  and should not be used as the sole basis for treatment or other  patient management decisions.  Raiquan Chandler negative result may occur with  improper specimen collection / handling, submission of specimen other  than nasopharyngeal swab, presence of viral mutation(s) within  the  areas targeted by this assay, and inadequate number of viral copies  (<250  copies / mL). Duilio Heritage negative result must be combined with clinical  observations, patient history, and epidemiological information. If result is POSITIVE SARS-CoV-2 target nucleic acids are DETECTED. The SARS-CoV-2 RNA is generally detectable in upper and lower  respiratory specimens dur ing the acute phase of infection.  Positive  results are indicative of active infection with SARS-CoV-2.  Clinical  correlation with patient history and other diagnostic information is  necessary to determine patient infection status.  Positive results do  not rule out bacterial infection or co-infection with other viruses. If result is PRESUMPTIVE POSTIVE SARS-CoV-2 nucleic acids MAY BE PRESENT.   Estephanie Hubbs presumptive positive result was obtained on the submitted specimen  and confirmed on repeat testing.  While 2019 novel coronavirus  (SARS-CoV-2) nucleic acids may be present in the submitted sample  additional confirmatory testing may be necessary for epidemiological  and / or clinical management purposes  to differentiate between  SARS-CoV-2 and other Sarbecovirus currently known to infect humans.  If clinically indicated additional testing with an alternate test  methodology [phone]) is advised. The SARS-CoV-2 RNA is generally  detectable in upper and lower respiratory sp ecimens during the acute  phase of infection. The expected result is Negative. Fact Sheet for Patients:  StrictlyIdeas.no Fact Sheet for Healthcare Providers: BankingDealers.co.za This test is not yet approved or cleared by the [place] FDA and has been authorized for detection and/or diagnosis of SARS-CoV-2 by FDA under an Emergency Use Authorization (EUA).  This EUA will remain in effect (meaning this test can be used) for the duration of the COVID-19 declaration under Section 564(b)(1) of the Act, 21 U.S.C. section 360bbb-3(b)(1), unless the authorization is terminated or revoked  sooner. Performed at [facility], [place] [address] [place], [place] [address]   Gastrointestinal Panel by PCR , Stool     Status: None   Collection Time: 01/12/19  5:55 PM  Result Value Ref Range Status   Campylobacter species NOT DETECTED NOT DETECTED Final   Plesimonas shigelloides NOT DETECTED NOT DETECTED Final   Salmonella species NOT DETECTED NOT DETECTED Final   Yersinia enterocolitica NOT DETECTED NOT DETECTED Final   Vibrio species NOT DETECTED NOT DETECTED Final   Vibrio cholerae NOT DETECTED NOT DETECTED Final   Enteroaggregative E coli (EAEC) NOT DETECTED NOT DETECTED Final   Enteropathogenic E coli (EPEC) NOT DETECTED NOT DETECTED Final   Enterotoxigenic E coli (ETEC) NOT DETECTED NOT DETECTED Final   Shiga like toxin producing E coli (STEC) NOT DETECTED NOT DETECTED Final   Shigella/Enteroinvasive E coli (EIEC) NOT DETECTED NOT DETECTED Final   Cryptosporidium NOT DETECTED NOT DETECTED Final   Cyclospora cayetanensis NOT DETECTED NOT DETECTED Final   Entamoeba histolytica NOT DETECTED NOT DETECTED Final   Giardia lamblia NOT DETECTED NOT DETECTED Final   Adenovirus F40/41 NOT DETECTED NOT DETECTED Final   Astrovirus NOT DETECTED NOT DETECTED Final   Norovirus GI/GII NOT DETECTED NOT DETECTED Final   Rotavirus Giavanni Odonovan NOT DETECTED NOT DETECTED Final   Sapovirus (I, II, IV, and V) NOT DETECTED NOT DETECTED Final    Comment: Performed at [facility], [place] [place], [place] [address]  Body fluid culture     Status: None   Collection Time: 01/13/19 10:33 AM  Result Value Ref Range Status   Specimen Description PERICARDIAL [facility]  Final   Special Requests NONE  Final   Gram  Stain   Final    ABUNDANT WBC PRESENT, PREDOMINANTLY PMN NO ORGANISMS SEEN    Culture   Final    NO GROWTH 3 DAYS Performed at [place] [address] [place], [place] [address]    Report Status 01/16/2019 FINAL  Final  MRSA PCR Screening     Status: None    Collection Time: 01/13/19 12:34 PM  Result Value Ref Range Status   MRSA by PCR NEGATIVE NEGATIVE Final    Comment:        The GeneXpert MRSA Assay (FDA approved for NASAL specimens only), is one component of [facility] comprehensive MRSA colonization surveillance program. It is not intended to diagnose MRSA infection nor to guide or monitor treatment for MRSA infections. Performed at [facility], [place] [address], [place], [place] [address]   Culture, Urine     Status: Abnormal (Preliminary result)   Collection Time: 01/14/19  6:10 PM  Result Value Ref Range Status   Specimen Description URINE, RANDOM  Final   Special Requests   Final    NONE Performed at [facility], [place] [address], [place], [place] [address]    Culture 80,000 COLONIES/mL ESCHERICHIA COLI (Aerilyn Slee)  Final   Report Status PENDING  Incomplete     Labs: Basic Metabolic Panel: Recent Labs  Lab 01/14/19 1102 01/14/19 1810 01/15/19 0120 01/15/19 0344 01/16/19 0613  NA 120* 121* 124* 124* 126*  K 3.9 3.9 3.5 3.7 4.0  CL 81* 83* 88* 87* 89*  CO2 '24 24 23 24 27  ' GLUCOSE 177* 219* 157* 148* 173*  BUN 58* 55* 47* 48* 32*  CREATININE 2.56* 2.19* 1.61* 1.56* 1.07*  CALCIUM 8.6* 8.7* 8.0* 8.4* 8.7*  MG  --   --   --  1.7 2.1   Liver Function Tests: Recent Labs  Lab 01/11/19 1608 01/15/19 0344 01/16/19 0613  AST 22 365* 198*  ALT 16 726* 562*  ALKPHOS 76 93 95  BILITOT 0.7 0.9 0.9  PROT 6.8 6.1* 6.7  ALBUMIN 3.1* 3.0* 3.1*   No results for input(s): LIPASE, AMYLASE in the last 168 hours. No results for input(s): AMMONIA in the last 168 hours. CBC: Recent Labs  Lab 01/11/19 1608  01/12/19 2029 01/13/19 0457  01/13/19 1052 01/14/19 0225 01/14/19 1102 01/15/19 0344 01/16/19 0613  WBC 12.4*   < > 14.5* 17.7*  --   --  14.9*  --  11.9* 8.8  NEUTROABS 10.8*  --   --   --   --   --   --   --   --   --   HGB 8.7*   < > 9.0* 8.9*   < > 8.2* 7.6* 7.8* 7.3* 7.5*  HCT 27.5*   < > 27.3* 26.8*   < > 24.0*  22.4* 23.6* 22.2* 23.4*  MCV 75.5*   < > 73.6* 73.2*  --   --  72.7*  --  74.0* 76.5*  PLT 384   < > 485* 493*  --   --  347  --  345 342   < > = values in this interval not displayed.   Cardiac Enzymes: Recent Labs  Lab 01/11/19 1608 01/11/19 2301 01/12/19 0254 01/12/19 0833  TROPONINI <0.03 <0.03 <0.03 <0.03   BNP: BNP (last 3 results) Recent Labs    12/29/18 2152 01/11/19 1618  BNP 277.4* 132.1*    ProBNP (last 3 results) Recent Labs    11/27/18 1547  PROBNP 219.0*    CBG: Recent Labs  Lab 01/15/19 1141 01/15/19 1627 01/15/19 2143 01/16/19 0751 01/16/19 1114  GLUCAP 172* 172* 187* 179* 172*       Signed:  Fayrene Helper MD.  [facility] 01/16/2019, 8:23 PM

## 2019-01-16 NOTE — Progress Notes (Signed)
PT Cancellation Note  Patient Details Name: [name] MRN: [id number] DOB: [birth year]   Cancelled Treatment:    Reason Eval/Treat Not Completed: Other (comment).  Reports she is leaving imminently and declined therapy due to close time frame.  Has been up doing short walks in the room per pt.  Follow up as time and pt allow.   [name] 01/16/2019, 1:58 PM  Mee Hives, PT MS Acute Rehab Dept. Number: [place] [facility] [place]

## 2019-01-16 NOTE — Plan of Care (Signed)

## 2019-01-16 NOTE — Progress Notes (Signed)
Order for f/u echo in 2 weeks placed to reassess pericardial effusion. She will have f/u w/ Dr. Gwenlyn Found or APP in clinic following echo.

## 2019-01-16 NOTE — Progress Notes (Signed)
Pt ambulated 300 ft in hallway with rolling walker and standby assistance. Tolerated well.

## 2019-01-17 LAB — URINE CULTURE: Culture: 80000 — AB

## 2019-01-18 ENCOUNTER — Other Ambulatory Visit: Payer: Self-pay | Admitting: Internal Medicine

## 2019-01-21 ENCOUNTER — Telehealth (HOSPITAL_COMMUNITY): Payer: Self-pay

## 2019-01-21 NOTE — Telephone Encounter (Signed)
Pt called asking if she could take cyclobenzaprine with all other medications she is taking.  She has a history of spondylosis and developed worsening ache.  She spoke with her pharmacist and they said there are no med interactions.  Spoke with Dr Haroldine Laws, pt can take cyclobenzaprine.  Pt made aware medicine can make her tired and fatigued. Pt due for office visit on Friday.

## 2019-01-22 ENCOUNTER — Telehealth (HOSPITAL_COMMUNITY): Payer: Self-pay

## 2019-01-22 NOTE — Telephone Encounter (Signed)

## 2019-01-23 ENCOUNTER — Ambulatory Visit (HOSPITAL_COMMUNITY)
Admission: RE | Admit: 2019-01-23 | Discharge: 2019-01-23 | Disposition: A | Discharge status: Home / Self Care | Payer: PPO | Source: Ambulatory Visit | Attending: Internal Medicine | Admitting: Internal Medicine

## 2019-01-23 ENCOUNTER — Other Ambulatory Visit: Payer: Self-pay

## 2019-01-23 ENCOUNTER — Encounter (HOSPITAL_COMMUNITY): Payer: Self-pay

## 2019-01-23 ENCOUNTER — Telehealth (HOSPITAL_COMMUNITY): Payer: Self-pay

## 2019-01-23 ENCOUNTER — Ambulatory Visit (HOSPITAL_BASED_OUTPATIENT_CLINIC_OR_DEPARTMENT_OTHER)
Admission: RE | Admit: 2019-01-23 | Discharge: 2019-01-23 | Disposition: A | Discharge status: Home / Self Care | Payer: PPO | Source: Ambulatory Visit | Attending: Internal Medicine | Admitting: Internal Medicine

## 2019-01-23 VITALS — BP 148/75 | HR 102 | Wt 184.8 lb

## 2019-01-23 DIAGNOSIS — E282 Polycystic ovarian syndrome: Secondary | ICD-10-CM | POA: Diagnosis not present

## 2019-01-23 DIAGNOSIS — I2510 Atherosclerotic heart disease of native coronary artery without angina pectoris: Secondary | ICD-10-CM | POA: Insufficient documentation

## 2019-01-23 DIAGNOSIS — I4821 Permanent atrial fibrillation: Secondary | ICD-10-CM | POA: Diagnosis not present

## 2019-01-23 DIAGNOSIS — I482 Chronic atrial fibrillation, unspecified: Secondary | ICD-10-CM | POA: Diagnosis not present

## 2019-01-23 DIAGNOSIS — N390 Urinary tract infection, site not specified: Secondary | ICD-10-CM | POA: Diagnosis not present

## 2019-01-23 DIAGNOSIS — I5032 Chronic diastolic (congestive) heart failure: Secondary | ICD-10-CM | POA: Insufficient documentation

## 2019-01-23 DIAGNOSIS — K219 Gastro-esophageal reflux disease without esophagitis: Secondary | ICD-10-CM | POA: Insufficient documentation

## 2019-01-23 DIAGNOSIS — Z7984 Long term (current) use of oral hypoglycemic drugs: Secondary | ICD-10-CM | POA: Diagnosis not present

## 2019-01-23 DIAGNOSIS — Z79899 Other long term (current) drug therapy: Secondary | ICD-10-CM | POA: Diagnosis not present

## 2019-01-23 DIAGNOSIS — I313 Pericardial effusion (noninflammatory): Secondary | ICD-10-CM | POA: Insufficient documentation

## 2019-01-23 DIAGNOSIS — Z87891 Personal history of nicotine dependence: Secondary | ICD-10-CM | POA: Diagnosis not present

## 2019-01-23 DIAGNOSIS — M79662 Pain in left lower leg: Secondary | ICD-10-CM | POA: Diagnosis not present

## 2019-01-23 DIAGNOSIS — I110 Hypertensive heart disease with heart failure: Secondary | ICD-10-CM | POA: Diagnosis not present

## 2019-01-23 DIAGNOSIS — H409 Unspecified glaucoma: Secondary | ICD-10-CM | POA: Insufficient documentation

## 2019-01-23 DIAGNOSIS — I82462 Acute embolism and thrombosis of left calf muscular vein: Secondary | ICD-10-CM

## 2019-01-23 DIAGNOSIS — I5033 Acute on chronic diastolic (congestive) heart failure: Secondary | ICD-10-CM | POA: Diagnosis not present

## 2019-01-23 DIAGNOSIS — K760 Fatty (change of) liver, not elsewhere classified: Secondary | ICD-10-CM | POA: Diagnosis not present

## 2019-01-23 DIAGNOSIS — H269 Unspecified cataract: Secondary | ICD-10-CM | POA: Diagnosis not present

## 2019-01-23 DIAGNOSIS — I509 Heart failure, unspecified: Secondary | ICD-10-CM

## 2019-01-23 DIAGNOSIS — E119 Type 2 diabetes mellitus without complications: Secondary | ICD-10-CM | POA: Insufficient documentation

## 2019-01-23 DIAGNOSIS — E871 Hypo-osmolality and hyponatremia: Secondary | ICD-10-CM | POA: Insufficient documentation

## 2019-01-23 DIAGNOSIS — Z9181 History of falling: Secondary | ICD-10-CM | POA: Diagnosis not present

## 2019-01-23 DIAGNOSIS — M1990 Unspecified osteoarthritis, unspecified site: Secondary | ICD-10-CM | POA: Insufficient documentation

## 2019-01-23 DIAGNOSIS — E785 Hyperlipidemia, unspecified: Secondary | ICD-10-CM | POA: Diagnosis not present

## 2019-01-23 DIAGNOSIS — J449 Chronic obstructive pulmonary disease, unspecified: Secondary | ICD-10-CM | POA: Diagnosis not present

## 2019-01-23 DIAGNOSIS — Z7901 Long term (current) use of anticoagulants: Secondary | ICD-10-CM | POA: Diagnosis not present

## 2019-01-23 DIAGNOSIS — Z6833 Body mass index (BMI) 33.0-33.9, adult: Secondary | ICD-10-CM | POA: Insufficient documentation

## 2019-01-23 DIAGNOSIS — M4800 Spinal stenosis, site unspecified: Secondary | ICD-10-CM | POA: Diagnosis not present

## 2019-01-23 DIAGNOSIS — F410 Panic disorder [episodic paroxysmal anxiety] without agoraphobia: Secondary | ICD-10-CM | POA: Diagnosis not present

## 2019-01-23 DIAGNOSIS — Z8249 Family history of ischemic heart disease and other diseases of the circulatory system: Secondary | ICD-10-CM | POA: Insufficient documentation

## 2019-01-23 DIAGNOSIS — M1991 Primary osteoarthritis, unspecified site: Secondary | ICD-10-CM | POA: Diagnosis not present

## 2019-01-23 LAB — BASIC METABOLIC PANEL
Anion gap: 11 (ref 5–15)
BUN: 13 mg/dL (ref 8–23)
CO2: 28 mmol/L (ref 22–32)
Calcium: 8.9 mg/dL (ref 8.9–10.3)
Chloride: 89 mmol/L — ABNORMAL LOW (ref 98–111)
Creatinine, Ser: 0.92 mg/dL (ref 0.44–1.00)
GFR calc Af Amer: >60 mL/min (ref >60)
GFR calc non Af Amer: >60 mL/min (ref >60)
Glucose, Bld: 194 mg/dL — ABNORMAL HIGH (ref 70–99)
Potassium: 3.7 mmol/L (ref 3.5–5.1)
Sodium: 128 mmol/L — ABNORMAL LOW (ref 135–145)

## 2019-01-23 LAB — CBC
HCT: 28.4 % — ABNORMAL LOW (ref 36.0–46.0)
Hemoglobin: 9.1 g/dL — ABNORMAL LOW (ref 12.0–15.0)
MCH: 24.5 pg — ABNORMAL LOW (ref 26.0–34.0)
MCHC: 32 g/dL (ref 30.0–36.0)
MCV: 76.5 fL — ABNORMAL LOW (ref 80.0–100.0)
Platelets: 398 10*3/uL (ref 150–400)
RBC: 3.71 MIL/uL — ABNORMAL LOW (ref 3.87–5.11)
RDW: 20.1 % — ABNORMAL HIGH (ref 11.5–15.5)
WBC: 13.8 10*3/uL — ABNORMAL HIGH (ref 4.0–10.5)
nRBC: 0 % (ref 0.0–0.2)

## 2019-01-23 LAB — BRAIN NATRIURETIC PEPTIDE: B Natriuretic Peptide: 317.5 pg/mL — ABNORMAL HIGH (ref 0.0–100.0)

## 2019-01-23 MED ORDER — RIVAROXABAN 20 MG PO TABS: 20.0000 mg | ORAL_TABLET | Freq: Every day | ORAL | 6 refills | Status: DC

## 2019-01-23 MED ORDER — APIXABAN 5 MG PO TABS: 5.0000 mg | ORAL_TABLET | Freq: Two times a day (BID) | ORAL | 6 refills | Status: DC

## 2019-01-23 NOTE — Telephone Encounter (Signed)
-----   Message from Conrad Conetoe, NP sent at 01/23/2019 12:30 PM EDT ----- [place] negative for DVT. Please make sure she is aware.

## 2019-01-23 NOTE — Progress Notes (Signed)
PCP: Dr Wyn[facility]; Dr Gwenlyn Found  Primary HF Cardiologist: Dr Haroldine Laws   HPI: [name] is a 68 y.o. female with a hx of chronic diastolic congestive heart failure,  CAD, DM, HTN, and recent pericardial effusion.    Admitted 01/12/19 with increased shortness of breath. Hospital course complicated by pericardiocentesis on 01/13/19. ECHO completed and showed pericardial effusion. Pericardiocentesis was very bloody so eliquis was not restarted. Diuresed with IV lasix and transitioned to torsemide 40 mg daily. Eliquis was not restarted at discharge.  Discharged on 6/5 with weight of 184 pounds.   Today she returns for post hospital follow up. Overall feeling ok. Complaining of LLE pain and edema. Complaining of restless leg fatigue. SOB with exertion. Denies PND/Orthopnea. Appetite ok. No fever or chills. Weight at home 182-183  pounds. Taking all medications.   ROS: All systems negative except as listed in HPI, PMH and Problem List.  SH:  Social History   Socioeconomic History  . Marital status: Married    Spouse name: Not on file  . Number of children: 2  . Years of education: master's  . Highest education level: Not on file  Occupational History  . Occupation: Forensic scientist: [employer]  . Financial resource strain: Not on file  . Food insecurity    Worry: Not on file    Inability: Not on file  . Transportation needs    Medical: Not on file    Non-medical: Not on file  Tobacco Use  . Smoking status: Former Smoker    Packs/day: 1.50    Years: 30.00    Pack years: 45.00    Types: Cigarettes    Quit date: 08/13/1993    Years since quitting: 25.4  . Smokeless tobacco: Never Used  Substance and Sexual Activity  . Alcohol use: No  . Drug use: No  . Sexual activity: Not on file  Lifestyle  . Physical activity    Days per week: Not on file    Minutes per session: Not on file  . Stress: Not on file  Relationships  . Social  Herbalist on phone: Not on file    Gets together: Not on file    Attends religious service: Not on file    Active member of club or organization: Not on file    Attends meetings of clubs or organizations: Not on file    Relationship status: Not on file  . Intimate partner violence    Fear of current or ex partner: Not on file    Emotionally abused: Not on file    Physically abused: Not on file    Forced sexual activity: Not on file  Other Topics Concern  . Not on file  Social History Narrative  . Not on file    FH:  Family History  Problem Relation Age of Onset  . Heart attack Father 79       2nd MI at 27  . Colon cancer Brother 83  . Gout Brother   . Ulcers Mother   . Emphysema Mother 66  . Colon polyps Sister   . Cancer Sister        Basal cell carcinoma  . Pneumonia Maternal Grandmother   . Hypertension Brother   . Hyperlipidemia Brother   . Cancer Brother        Skin  . Diabetes Neg Hx   . Stroke Neg Hx  Past Medical History:  Diagnosis Date  . Anemia, unspecified   . Arthritis   . Asthma   . Atrial fibrillation with RVR ([place])    a. on Xarelto  . Bell's palsy    Facial nerve decompression in 2001  . CHF (congestive heart failure) ([place])   . Chronic low back pain   . COPD with asthma ([place])   . Coronary artery disease    Myoview 04/12/11 was entirely normal. ECHO 02/26/08 showed only minor abnormalities. Stenting 05/26/08 of her posterolateral branch to the left circumflex coronary artery. Used a 2.5x30mm Taxus Monorail stent.myoview 2014 was without ischemia  . Diabetes mellitus    Type 2  . Early cataracts, bilateral   . Fatty liver   . GERD (gastroesophageal reflux disease)   . Glaucoma   . Glaucoma   . Goiter   . Heart murmur   . History of nuclear stress test 2012; 2014   lexiscan; normal pattern of perfusion; normal, low risk scan   . Hyperlipidemia   . Hypertension   . Panic disorder   . Pneumonia 2008  . Polycystic ovary disease     Hysterectomy in 1982 for this  . Shortness of breath dyspnea    ECHO 02/26/08 showed only minor abnormalities  . Spinal stenosis     Current Outpatient Medications  Medication Sig Dispense Refill  . ascorbic acid (VITAMIN C) 1000 MG tablet Take 1,000 mg by mouth 2 (two) times daily.     [name] CALCIUM-MAG-VIT C-VIT D PO Take 1 tablet by mouth daily.     . Continuous Blood Gluc Sensor (FREESTYLE LIBRE SENSOR SYSTEM) MISC Use to monitor sugars 1 each 0  . diltiazem (CARDIZEM CD) 180 MG 24 hr capsule TAKE 1 CAPSULE DAILY. (Patient taking differently: Take 180 mg by mouth daily. ) 90 capsule 3  . gabapentin (NEURONTIN) 100 MG capsule TAKE (1) CAPSULE THREE TIMES DAILY. (Patient taking differently: Take 100 mg by mouth 3 (three) times daily. ) 180 capsule 3  . guaiFENesin (MUCINEX) 600 MG 12 hr tablet Take 600 mg by mouth 2 (two) times daily as needed for cough.     [name] HYDROcodone-acetaminophen (NORCO/VICODIN) 5-325 MG tablet Take 1 tablet by mouth at bedtime. 30 tablet 0  . INVOKANA 100 MG TABS tablet TAKE 1 TABLET ONCE DAILY. (Patient taking differently: Take 100 mg by mouth daily before breakfast. ) 90 tablet 1  . isosorbide mononitrate (IMDUR) 30 MG 24 hr tablet Take 0.5 tablets (15 mg total) by mouth daily. 45 tablet 0  . latanoprost (XALATAN) 0.005 % ophthalmic solution Place 1 drop into both eyes at bedtime.    [name] LORazepam (ATIVAN) 0.5 MG tablet TAKE 1 OR 2 TABLETS AT BEDTIME. (Patient taking differently: Take 0.5-1 mg by mouth at bedtime. ) 60 tablet 3  . Melatonin 5 MG TABS Take 2-3 tablets by mouth at bedtime.     . metoprolol succinate (TOPROL-XL) 25 MG 24 hr tablet Take 1 tablet (25 mg total) by mouth as needed for up to 30 days. Take as needed for palpitations 30 tablet   . metoprolol succinate (TOPROL-XL) 50 MG 24 hr tablet Take 100 mg by mouth daily.     . Multiple Vitamins-Iron (MULTIVITAMINS WITH IRON) TABS Take 1 tablet by mouth daily.     [name] nystatin ointment (MYCOSTATIN) Apply 1  application topically 2 (two) times daily as needed (IRRITATION).   0  . omeprazole (PRILOSEC) 20 MG capsule Take 1 capsule (20 mg total) by  mouth 2 (two) times daily before a meal.    . ONETOUCH DELICA LANCETS FINE MISC 1 Units by Does not apply route as directed. 100 each 3  . PARoxetine (PAXIL) 10 MG tablet TAKE 2 AND 1/2 TABS DAILY (Patient taking differently: Take 25 mg by mouth daily. ) 225 tablet 3  . potassium chloride (MICRO-K) 10 MEQ CR capsule Take 2 capsules (20 mEq total) by mouth daily for 30 days. 60 capsule 0  . rOPINIRole (REQUIP XL) 2 MG 24 hr tablet TAKE ONE TABLET AT BEDTIME. (Patient taking differently: Take 2 mg by mouth at bedtime. ) 30 tablet 5  . rOPINIRole (REQUIP) 0.5 MG tablet Take 1 to 2 tab at midnight. ( XL form at 6 PM) (Patient taking differently: Take 0.5-1 mg by mouth daily as needed (restless legs). ) 60 tablet 6  . senna (SENOKOT) 8.6 MG TABS Take 1 tablet by mouth at bedtime.     . sitaGLIPtin-metformin (JANUMET) 50-1000 MG tablet TAKE 1/2 TABLET IN THE MORNING WITH BREAKFAST AND 1 TABLET IN THE EVENING WITH SUPPER. 45 tablet 3  . Tiotropium Bromide-Olodaterol (STIOLTO RESPIMAT) 2.5-2.5 MCG/ACT AERS Inhale 2 puffs into the lungs daily. 1 Inhaler 1  . torsemide (DEMADEX) 20 MG tablet Take 2 tablets (40 mg total) by mouth 2 (two) times daily. (after 2-3 days, please resume 20 mg twice daily) 90 tablet 3   No current facility-administered medications for this encounter.     Vitals:   01/23/19 1008  BP: (!) 148/75  Pulse: (!) 102  SpO2: 97%  Weight: 83.8 kg (184 lb 12.8 oz)   Wt Readings from Last 3 Encounters:  01/23/19 83.8 kg (184 lb 12.8 oz)  01/16/19 88.6 kg (195 lb 4.8 oz)  01/06/19 93 kg (205 lb)    PHYSICAL EXAM: General:  Well appearing. No resp difficulty HEENT: normal Neck: supple. JVP 9-10  Carotids 2+ bilaterally; no bruits. No lymphadenopathy or thryomegaly appreciated. Cor: PMI normal. Irregular rate & rhythm. No rubs, gallops or  murmurs. Lungs: clear Abdomen: soft, nontender, nondistended. No hepatosplenomegaly. No bruits or masses. Good bowel sounds. Extremities: no cyanosis, clubbing, rash, RLE and LLE 1-2+ edema L>R  Neuro: alert & orientedx3, cranial nerves grossly intact. Moves all 4 extremities w/o difficulty. Affect pleasant.  ASSESSMENT & PLAN:  1. H/O Large pericardial effusion suspect pericardial tamponade - s/p pericardiocentesis 6/2. Cytology negative. LDH 1289 pH 7.0 -> exudative. ? Post-viral - Repeat echo next week.  2. H/O AKI  Repeat BMET   3. Chronic Diastolic HF Volume status mildly elevated. Continue torsemide 40 mg twice a day.  Check BMET today.   4. Hyponatremia -Check BMET today   5. Chronic AF Continue current dose toprol xl.  Restart xarelto 20 mg daily   6. Morbid obesity Body mass index is 33.8 kg/m.   7. LLE Pain  Check doppler study to rule out DVT now. ----> venous doppler study was negative.   ECHO has been set up. Follow up with Dr Haroldine Laws in 3 months.   Amy Clegg 12:05 PM   Patient seen and examined with Darrick Grinder, NP. We discussed all aspects of the encounter. I agree with the assessment and plan as stated above.   69 y/o woman recently admitted with pericardial tamponade and shock c/b AKI. Underwent pericardiocentesis with ~ 1L of bloody fluid out. W/u appears to be viral in nature. Now improved but still with volume overload with LE edema L>R. Mild JVP. Lung clear.  LE u/s  done here and viewed personally. No evidence of DVT. Wil continue diuresis. Resume Xarelto. Repeat echo next week.  Glori Bickers, MD  12:14 PM

## 2019-01-23 NOTE — Patient Instructions (Addendum)
Labs today We will only contact you if something comes back abnormal or we need to make some changes. Otherwise no news is good news!  Lower extremity dopplers today. Office will contact you with results.  CONTINUE Torsemide 40mg  (2 tabs) twice a day   RESTART Xarelto 20mg  (1 tab) daily with supper. May change if you have a DVT  Your physician recommends that you schedule a follow-up appointment in: 3 months with Dr Haroldine Laws  At the [facility], you and your health needs are our priority. As part of our continuing mission to provide you with exceptional heart care, we have created designated Provider Care Teams. These Care Teams include your primary Cardiologist (physician) and Advanced Practice Providers (APPs- Physician Assistants and Nurse Practitioners) who all work together to provide you with the care you need, when you need it.   You may see any of the following providers on your designated Care Team at your next follow up: [name] Dr Glori Bickers . Dr Loralie Champagne . Darrick Grinder, NP

## 2019-01-23 NOTE — Telephone Encounter (Signed)
Pt aware of results for ultrasound of lower extremities and lab work.  Pt appreciative.

## 2019-01-26 ENCOUNTER — Telehealth: Payer: Self-pay | Admitting: Internal Medicine

## 2019-01-26 DIAGNOSIS — M19071 Primary osteoarthritis, right ankle and foot: Secondary | ICD-10-CM | POA: Diagnosis not present

## 2019-01-26 DIAGNOSIS — M545 Low back pain: Secondary | ICD-10-CM | POA: Diagnosis not present

## 2019-01-26 DIAGNOSIS — M19072 Primary osteoarthritis, left ankle and foot: Secondary | ICD-10-CM | POA: Diagnosis not present

## 2019-01-26 NOTE — Telephone Encounter (Unsigned)
Copied from [place] [id number]. Topic: Quick Communication - Home Health Verbal Orders >> Jan 26, 2019 12:23 PM Celene Kras A wrote: Caller/Agency: [facility] Callback Number: [phone] secure line Requesting OT/PT/Skilled Nursing/Social Work/Speech Therapy: PT Frequency: 1x for 1wk, 2x for 5wks

## 2019-01-27 ENCOUNTER — Ambulatory Visit: Payer: PPO | Admitting: Cardiovascular Disease

## 2019-01-27 NOTE — Telephone Encounter (Signed)
Verbals given  

## 2019-01-27 NOTE — Telephone Encounter (Signed)
Fine

## 2019-01-28 ENCOUNTER — Ambulatory Visit: Payer: Self-pay

## 2019-01-28 ENCOUNTER — Telehealth (HOSPITAL_COMMUNITY): Payer: Self-pay | Admitting: Radiology

## 2019-01-28 NOTE — Telephone Encounter (Signed)
Patient called and says she got out of the hospital last Friday on 01/16/19 and says she feels something catching in her bronchial tube and wants to know if Dr. Sharlet Salina will send an order for a chest x-ray to be done at the cardiologist office tomorrow while she's there having an Echo done. She says she's not SOB, no CP, but it's different than it was when she got out of the hospital for having fluid buildup. She says she's been having this feeling since last night before she went to sleep. She says she has the urge to cough when she feels it. She asks for a call back to let her know if she can have the chest x-ray.  Reason for Disposition . [1] Follow-up call from patient regarding patient's clinical status AND [2] information NON-URGENT  Protocols used: PCP CALL - NO TRIAGE-A-AH

## 2019-01-28 NOTE — Telephone Encounter (Signed)
Left message with echo instructions.

## 2019-01-29 ENCOUNTER — Ambulatory Visit (HOSPITAL_COMMUNITY): Payer: PPO | Attending: Internal Medicine

## 2019-01-29 ENCOUNTER — Other Ambulatory Visit: Payer: Self-pay

## 2019-01-29 DIAGNOSIS — I313 Pericardial effusion (noninflammatory): Secondary | ICD-10-CM | POA: Diagnosis not present

## 2019-01-29 DIAGNOSIS — I3139 Other pericardial effusion (noninflammatory): Secondary | ICD-10-CM

## 2019-01-29 NOTE — Telephone Encounter (Signed)
I can't order a chest x-ray at the cardiologist's office. Also the new cough may be a reason to not go in for the echo tomorrow due to covid-19 restrictions.

## 2019-01-29 NOTE — Telephone Encounter (Signed)
LVM with MD response  

## 2019-01-30 DIAGNOSIS — M1991 Primary osteoarthritis, unspecified site: Secondary | ICD-10-CM | POA: Diagnosis not present

## 2019-01-30 DIAGNOSIS — K760 Fatty (change of) liver, not elsewhere classified: Secondary | ICD-10-CM | POA: Diagnosis not present

## 2019-01-30 DIAGNOSIS — I5033 Acute on chronic diastolic (congestive) heart failure: Secondary | ICD-10-CM | POA: Diagnosis not present

## 2019-01-30 DIAGNOSIS — N390 Urinary tract infection, site not specified: Secondary | ICD-10-CM | POA: Diagnosis not present

## 2019-01-30 DIAGNOSIS — Z7984 Long term (current) use of oral hypoglycemic drugs: Secondary | ICD-10-CM | POA: Diagnosis not present

## 2019-01-30 DIAGNOSIS — E119 Type 2 diabetes mellitus without complications: Secondary | ICD-10-CM | POA: Diagnosis not present

## 2019-01-30 DIAGNOSIS — H269 Unspecified cataract: Secondary | ICD-10-CM | POA: Diagnosis not present

## 2019-01-30 DIAGNOSIS — Z7901 Long term (current) use of anticoagulants: Secondary | ICD-10-CM | POA: Diagnosis not present

## 2019-01-30 DIAGNOSIS — Z9181 History of falling: Secondary | ICD-10-CM | POA: Diagnosis not present

## 2019-01-30 DIAGNOSIS — I4821 Permanent atrial fibrillation: Secondary | ICD-10-CM | POA: Diagnosis not present

## 2019-01-30 DIAGNOSIS — J449 Chronic obstructive pulmonary disease, unspecified: Secondary | ICD-10-CM | POA: Diagnosis not present

## 2019-01-30 DIAGNOSIS — M4800 Spinal stenosis, site unspecified: Secondary | ICD-10-CM | POA: Diagnosis not present

## 2019-01-30 DIAGNOSIS — Z6833 Body mass index (BMI) 33.0-33.9, adult: Secondary | ICD-10-CM | POA: Diagnosis not present

## 2019-01-30 DIAGNOSIS — H409 Unspecified glaucoma: Secondary | ICD-10-CM | POA: Diagnosis not present

## 2019-01-30 DIAGNOSIS — I110 Hypertensive heart disease with heart failure: Secondary | ICD-10-CM | POA: Diagnosis not present

## 2019-01-30 DIAGNOSIS — F410 Panic disorder [episodic paroxysmal anxiety] without agoraphobia: Secondary | ICD-10-CM | POA: Diagnosis not present

## 2019-01-30 DIAGNOSIS — I2510 Atherosclerotic heart disease of native coronary artery without angina pectoris: Secondary | ICD-10-CM | POA: Diagnosis not present

## 2019-01-30 DIAGNOSIS — Z87891 Personal history of nicotine dependence: Secondary | ICD-10-CM | POA: Diagnosis not present

## 2019-02-01 NOTE — Progress Notes (Signed)
Cardiology Office Note   Date:  02/01/2019   ID:  SYLWIA CUERVO, DOB 04-30-1951, MRN [id number]  PCP:  Hoyt Koch, MD  Cardiologist: Gwenlyn Found  CHF Cardiologist: Bensimhon Chief Complaint:     History of Present Illness: [name] is a 68 y.o. female who presents for ongoing assessment and management of chronic diastolic heart failure, history of CAD, hypertension, diabetes, and pericardial effusion.  The patient was seen last in the office on 01/23/2019 after admission for decompensated heart failure, the patient had a pericardiocentesis on 01/13/2019, echo completed showed pericardial effusion with pericardiocentesis revealing very bloody so Eliquis was not restarted.  The patient was given IV Lasix and transitioned back to torsemide 40 mg daily.  Eliquis remains on hold.  Discharge weight of 184 pounds.  On the last office visit she was feeling better but still had complaints of lower extremity edema and discomfort.  Restless legs.& shortness of breath on exertion.  A repeat echocardiogram was ordered for follow-up.  It was noted that her cytology was negative on pericardiocentesis fluid.  Was felt to be post viral.  Echocardiogram dated 01/29/2019, did not reveal pericardial effusion.  LVEF was 60% to 65%, mild LVH, normal wall motion, severe LAE, MAC with trivial MR, trivial AI.  The patient reported via phone call in 01/28/2019 that she feels something catching in her bronchial tube.  She says that she has an urge to cough when she feels it.  She called her primary care physician for recommendations.  She was not aware of her echo report so I have printed out a copy. She states that she is having rapid HR at home, requiring her to take extra doses of the metoprolol. She requests to go back on metoprolol 50 mg TID as she was taking before she was in the hospital since she is having to take extra doses.   Past Medical History:  Diagnosis Date  . Anemia, unspecified   . Arthritis    . Asthma   . Atrial fibrillation with RVR ([place])    a. on Xarelto  . Bell's palsy    Facial nerve decompression in 2001  . CHF (congestive heart failure) ([place])   . Chronic low back pain   . COPD with asthma ([place])   . Coronary artery disease    Myoview 04/12/11 was entirely normal. ECHO 02/26/08 showed only minor abnormalities. Stenting 05/26/08 of her posterolateral branch to the left circumflex coronary artery. Used a 2.5x18m Taxus Monorail stent.myoview 2014 was without ischemia  . Diabetes mellitus    Type 2  . Early cataracts, bilateral   . Fatty liver   . GERD (gastroesophageal reflux disease)   . Glaucoma   . Glaucoma   . Goiter   . Heart murmur   . History of nuclear stress test 2012; 2014   lexiscan; normal pattern of perfusion; normal, low risk scan   . Hyperlipidemia   . Hypertension   . Panic disorder   . Pneumonia 2008  . Polycystic ovary disease    Hysterectomy in 1982 for this  . Shortness of breath dyspnea    ECHO 02/26/08 showed only minor abnormalities  . Spinal stenosis     Past Surgical History:  Procedure Laterality Date  . ABDOMINAL HYSTERECTOMY  1982   & BSO; for polycystic ovary disease  . CARDIOVERSION N/A 12/17/2013   Procedure: CARDIOVERSION;  Surgeon: KPixie Casino MD;  [facility];  Service: Cardiovascular;  Laterality: N/A;  . CARDIOVERSION  N/A 09/30/2015   Procedure: CARDIOVERSION;  Surgeon: Dorothy Spark, MD;  Location: [facility] ENDOSCOPY;  Service: Cardiovascular;  Laterality: N/A;  . CARDIOVERSION N/A 06/22/2016   Procedure: CARDIOVERSION;  Surgeon: Skeet Latch, MD;  Location: [place];  Service: Cardiovascular;  Laterality: N/A;  . CENTRAL LINE INSERTION  01/13/2019   Procedure: CENTRAL LINE INSERTION;  Surgeon: Jolaine Artist, MD;  Location: [place] CV LAB;  Service: Cardiovascular;;  . COLONOSCOPY     last 2009; Dr Cristina Gong; due 2019  . COLONOSCOPY WITH PROPOFOL N/A 01/23/2018   Procedure: COLONOSCOPY WITH  PROPOFOL;  Surgeon: Ronald Lobo, MD;  Location: [place];  Service: Endoscopy;  Laterality: N/A;  . CORONARY ANGIOPLASTY  05/26/2008   Stenting of her posterolateral branch to the left circumflex coronary artery. Used a 2.5x2m Taxus Monorail stent.  . ESOPHAGOGASTRODUODENOSCOPY (EGD) WITH PROPOFOL N/A 01/23/2018   Procedure: ESOPHAGOGASTRODUODENOSCOPY (EGD) WITH PROPOFOL;  Surgeon: BRonald Lobo MD;  [place]  Service: Endoscopy;  Laterality: N/A;  . FACIAL NERVE DECOMPRESSION  2001/2002   bells palsy   . LAPAROSCOPIC CHOLECYSTECTOMY  06/15/2011    Dr IDalbert Batman . PERICARDIOCENTESIS N/A 01/13/2019   Procedure: PERICARDIOCENTESIS;  Surgeon: MBurnell Blanks MD;  [place] [facility];  Service: Cardiovascular;  Laterality: N/A;  . RIGHT AND LEFT HEART CATH N/A 01/13/2019   Procedure: RIGHT AND LEFT HEART CATH;  Surgeon: BJolaine Artist MD;  [place];  Service: Cardiovascular;  Laterality: N/A;  . TEE WITHOUT CARDIOVERSION N/A 12/17/2013   Procedure: TRANSESOPHAGEAL ECHOCARDIOGRAM (TEE);  Surgeon: KPixie Casino MD;  [facility];  Service: Cardiovascular;  Laterality: N/A;  trish/ja  . TRANSTHORACIC ECHOCARDIOGRAM  75/37/4827  LV systolic function normal with mild conc LVH; LA mildly dilated; trace MR/TR  . UPPER GI ENDOSCOPY  2009   negative     Current Outpatient Medications  Medication Sig Dispense Refill  . ascorbic acid (VITAMIN C) 1000 MG tablet Take 1,000 mg by mouth 2 (two) times daily.     .Marland KitchenCALCIUM-MAG-VIT C-VIT D PO Take 1 tablet by mouth daily.     . Continuous Blood Gluc Sensor (FREESTYLE LIBRE SENSOR SYSTEM) MISC Use to monitor sugars 1 each 0  . diltiazem (CARDIZEM CD) 180 MG 24 hr capsule TAKE 1 CAPSULE DAILY. (Patient taking differently: Take 180 mg by mouth daily. ) 90 capsule 3  . gabapentin (NEURONTIN) 100 MG capsule TAKE (1) CAPSULE THREE TIMES DAILY. (Patient taking differently: Take 100 mg by mouth  3 (three) times daily. ) 180 capsule 3  . guaiFENesin (MUCINEX) 600 MG 12 hr tablet Take 600 mg by mouth 2 (two) times daily as needed for cough.     .Marland KitchenHYDROcodone-acetaminophen (NORCO/VICODIN) 5-325 MG tablet Take 1 tablet by mouth at bedtime. 30 tablet 0  . INVOKANA 100 MG TABS tablet TAKE 1 TABLET ONCE DAILY. (Patient taking differently: Take 100 mg by mouth daily before breakfast. ) 90 tablet 1  . isosorbide mononitrate (IMDUR) 30 MG 24 hr tablet Take 0.5 tablets (15 mg total) by mouth daily. 45 tablet 0  . latanoprost (XALATAN) 0.005 % ophthalmic solution Place 1 drop into both eyes at bedtime.    .Marland KitchenLORazepam (ATIVAN) 0.5 MG tablet TAKE 1 OR 2 TABLETS AT BEDTIME. (Patient taking differently: Take 0.5-1 mg by mouth at bedtime. ) 60 tablet 3  . Melatonin 5 MG TABS Take 2-3 tablets by mouth at bedtime.     . metoprolol succinate (TOPROL-XL) 25  MG 24 hr tablet Take 1 tablet (25 mg total) by mouth as needed for up to 30 days. Take as needed for palpitations 30 tablet   . metoprolol succinate (TOPROL-XL) 50 MG 24 hr tablet Take 100 mg by mouth daily.     . Multiple Vitamins-Iron (MULTIVITAMINS WITH IRON) TABS Take 1 tablet by mouth daily.     [name] nystatin ointment (MYCOSTATIN) Apply 1 application topically 2 (two) times daily as needed (IRRITATION).   0  . omeprazole (PRILOSEC) 20 MG capsule Take 1 capsule (20 mg total) by mouth 2 (two) times daily before a meal.    . ONETOUCH DELICA LANCETS FINE MISC 1 Units by Does not apply route as directed. 100 each 3  . PARoxetine (PAXIL) 10 MG tablet TAKE 2 AND 1/2 TABS DAILY (Patient taking differently: Take 25 mg by mouth daily. ) 225 tablet 3  . potassium chloride (MICRO-K) 10 MEQ CR capsule Take 2 capsules (20 mEq total) by mouth daily for 30 days. 60 capsule 0  . rivaroxaban (XARELTO) 20 MG TABS tablet Take 1 tablet (20 mg total) by mouth daily with supper. 30 tablet 6  . rOPINIRole (REQUIP XL) 2 MG 24 hr tablet TAKE ONE TABLET AT BEDTIME. (Patient taking  differently: Take 2 mg by mouth at bedtime. ) 30 tablet 5  . rOPINIRole (REQUIP) 0.5 MG tablet Take 1 to 2 tab at midnight. ( XL form at 6 PM) (Patient taking differently: Take 0.5-1 mg by mouth daily as needed (restless legs). ) 60 tablet 6  . senna (SENOKOT) 8.6 MG TABS Take 1 tablet by mouth at bedtime.     . sitaGLIPtin-metformin (JANUMET) 50-1000 MG tablet TAKE 1/2 TABLET IN THE MORNING WITH BREAKFAST AND 1 TABLET IN THE EVENING WITH SUPPER. 45 tablet 3  . Tiotropium Bromide-Olodaterol (STIOLTO RESPIMAT) 2.5-2.5 MCG/ACT AERS Inhale 2 puffs into the lungs daily. 1 Inhaler 1  . torsemide (DEMADEX) 20 MG tablet Take 2 tablets (40 mg total) by mouth 2 (two) times daily. (after 2-3 days, please resume 20 mg twice daily) 90 tablet 3   No current facility-administered medications for this visit.     Allergies:   Benadryl [diphenhydramine hcl], Clopidogrel bisulfate, and Contrast media [iodinated diagnostic agents]    Social History:  The patient  reports that she quit smoking about 25 years ago. Her smoking use included cigarettes. She has a 45.00 pack-year smoking history. She has never used smokeless tobacco. She reports that she does not drink alcohol or use drugs.   Family History:  The patient's family history includes Cancer in her brother and sister; Colon cancer (age of onset: 18) in her brother; Colon polyps in her sister; Emphysema (age of onset: 73) in her mother; Gout in her brother; Heart attack (age of onset: 73) in her father; Hyperlipidemia in her brother; Hypertension in her brother; Pneumonia in her maternal grandmother; Ulcers in her mother.    ROS: All other systems are reviewed and negative. Unless otherwise mentioned in H&P    PHYSICAL EXAM: VS:  There were no vitals taken for this visit. , BMI There is no height or weight on file to calculate BMI. GEN: Well nourished, well developed, in no acute distress HEENT: normal Neck: no JVD, carotid bruits, or masses Cardiac:  IRRR; tachycardic, soft systolic  murmur, rubs, or gallops,no edema  Respiratory:  Clear to auscultation bilaterally, normal work of breathing GI: soft, nontender, nondistended, + BS MS: no deformity or atrophy. Redness and erythema is noted  in the right leg, with evidence of cellulitis.  Skin: warm and dry, no rash Neuro:  Strength and sensation are intact Psych: euthymic mood, full affect   EKG:  Not completed.   Recent Labs: 10/02/2018: TSH 2.21 11/27/2018: Pro B Natriuretic peptide (BNP) 219.0 01/16/2019: ALT 562; Magnesium 2.1 01/23/2019: B Natriuretic Peptide 317.5; BUN 13; Creatinine, Ser 0.92; Hemoglobin 9.1; Platelets 398; Potassium 3.7; Sodium 128    Lipid Panel    Component Value Date/Time   CHOL 118 10/02/2018 1203   TRIG 123.0 10/02/2018 1203   HDL 51.10 10/02/2018 1203   CHOLHDL 2 10/02/2018 1203   VLDL 24.6 10/02/2018 1203   LDLCALC 42 10/02/2018 1203   LDLDIRECT 143.4 01/23/2008 1316      Wt Readings from Last 3 Encounters:  01/23/19 184 lb 12.8 oz (83.8 kg)  01/16/19 195 lb 4.8 oz (88.6 kg)  01/06/19 205 lb (93 kg)      Other studies Reviewed: Echocardiogram February 11, 2019 1. The left ventricle has normal systolic function with an ejection fraction of 60-65%. The cavity size was normal. There is mildly increased left ventricular wall thickness. Left ventricular diastolic function could not be evaluated secondary to atrial  fibrillation. No evidence of left ventricular regional wall motion abnormalities.  2. The right ventricle has normal systolic function. The cavity was normal. There is no increase in right ventricular wall thickness.  3. Left atrial size was severely dilated.  4. The mitral valve is abnormal. Mild thickening of the mitral valve leaflet. There is mild to moderate mitral annular calcification present.  5. The aortic valve is tricuspid. Mild sclerosis of the aortic valve. Aortic valve regurgitation is trivial by color flow Doppler. No stenosis of  the aortic valve.  6. The inferior vena cava was dilated in size with <50% respiratory variability. Pericardium: There is no evidence of pericardial effusion.  ASSESSMENT AND PLAN:  1. Pericardial Effusion: She had pericardiocentesis on recent hospitalization. Repeat echocardiogram revealed no pericardial effusion. She is breathing much better. She has an occasional cough. She hasTessalon Perles at home and I have okayed her to take them for symptomatic relief.   2. Cellulitis of the right LE: She has erythematous skin with small open sore noted, The leg is warm to touch. She will be placed on Keflex 250 mg BID. I wanted to place her on 500 mg BID but she reports that she has significant diarrhea on this medication at higher doses. She will follow up in a week with Almyra Deforest, PA. May need to lengthen the dose time. She is placed on this for 10 days.   3.Atrial fib: She is having to take extra doses of metoprolol 25 mg every day for rapid HR. She would like to try going back up on the metoprolol to 50 mg TID as she was taking in the past as she states that her HR did not go up so high. Continue on Xarelto as directed. She is to keep track of her BP to make sure it is not dropping too low.   Current medicines are reviewed at length with the patient today.    Labs/ tests ordered today include: None  Phill Myron. West Pugh, ANP, AACC   02/01/2019 10:06 AM    [facility] [address] Office [phone] Fax [phone]

## 2019-02-02 ENCOUNTER — Encounter: Payer: Self-pay | Admitting: Adult Health

## 2019-02-02 ENCOUNTER — Ambulatory Visit (INDEPENDENT_AMBULATORY_CARE_PROVIDER_SITE_OTHER): Payer: PPO | Admitting: Adult Health

## 2019-02-02 ENCOUNTER — Other Ambulatory Visit: Payer: Self-pay

## 2019-02-02 VITALS — BP 121/70 | HR 117 | Temp 99.0°F | Ht 62.0 in | Wt 180.2 lb

## 2019-02-02 DIAGNOSIS — I4811 Longstanding persistent atrial fibrillation: Secondary | ICD-10-CM

## 2019-02-02 DIAGNOSIS — I313 Pericardial effusion (noninflammatory): Secondary | ICD-10-CM

## 2019-02-02 DIAGNOSIS — L0390 Cellulitis, unspecified: Secondary | ICD-10-CM | POA: Diagnosis not present

## 2019-02-02 DIAGNOSIS — I358 Other nonrheumatic aortic valve disorders: Secondary | ICD-10-CM | POA: Diagnosis not present

## 2019-02-02 DIAGNOSIS — I3139 Other pericardial effusion (noninflammatory): Secondary | ICD-10-CM

## 2019-02-02 MED ORDER — CEPHALEXIN 250 MG PO CAPS: 250.0000 mg | ORAL_CAPSULE | Freq: Two times a day (BID) | ORAL | 0 refills | Status: DC

## 2019-02-02 MED ORDER — METOPROLOL SUCCINATE ER 25 MG PO TB24: ORAL_TABLET | ORAL | Status: DC

## 2019-02-02 NOTE — Patient Instructions (Signed)
Medication Instructions:  TAKE METOPROLOL 50MG  THREE TIMES DAILY, MAY TAKE ADDITIONAL 1/2 TAB-25MG  AS NEEDED  TAKE KEFLEX 250MG  TWICE DAILY X14DAYS FOR CELLULITIS If you need a refill on your cardiac medications before your next appointment, please call your pharmacy.  Special Instructions: TAKE AND LOG YOU BP AND HEART-RATE DAILY  Follow-Up: .   KEEP SCHEDULED FOLLOW UP APPOINTMENT WITH HAO MENG, PA-C  At [facility], you and your health needs are our priority.  As part of our continuing mission to provide you with exceptional heart care, we have created designated Provider Care Teams.  These Care Teams include your primary Cardiologist (physician) and Advanced Practice Providers (APPs -  Physician Assistants and Nurse Practitioners) who all work together to provide you with the care you need, when you need it.  Thank you for choosing [facility] at [facility]!!

## 2019-02-09 ENCOUNTER — Ambulatory Visit: Payer: PPO | Admitting: Physician Assistant

## 2019-02-11 ENCOUNTER — Other Ambulatory Visit (HOSPITAL_COMMUNITY): Payer: Self-pay | Admitting: Cardiology

## 2019-02-11 MED ORDER — POTASSIUM CHLORIDE ER 10 MEQ PO CPCR: 20.0000 meq | ORAL_CAPSULE | Freq: Every day | ORAL | 3 refills | Status: DC

## 2019-02-13 DIAGNOSIS — I5033 Acute on chronic diastolic (congestive) heart failure: Secondary | ICD-10-CM | POA: Diagnosis not present

## 2019-02-13 DIAGNOSIS — F410 Panic disorder [episodic paroxysmal anxiety] without agoraphobia: Secondary | ICD-10-CM | POA: Diagnosis not present

## 2019-02-13 DIAGNOSIS — J449 Chronic obstructive pulmonary disease, unspecified: Secondary | ICD-10-CM | POA: Diagnosis not present

## 2019-02-13 DIAGNOSIS — H269 Unspecified cataract: Secondary | ICD-10-CM | POA: Diagnosis not present

## 2019-02-13 DIAGNOSIS — Z7901 Long term (current) use of anticoagulants: Secondary | ICD-10-CM | POA: Diagnosis not present

## 2019-02-13 DIAGNOSIS — N390 Urinary tract infection, site not specified: Secondary | ICD-10-CM | POA: Diagnosis not present

## 2019-02-13 DIAGNOSIS — I110 Hypertensive heart disease with heart failure: Secondary | ICD-10-CM | POA: Diagnosis not present

## 2019-02-13 DIAGNOSIS — M4800 Spinal stenosis, site unspecified: Secondary | ICD-10-CM | POA: Diagnosis not present

## 2019-02-13 DIAGNOSIS — K760 Fatty (change of) liver, not elsewhere classified: Secondary | ICD-10-CM | POA: Diagnosis not present

## 2019-02-13 DIAGNOSIS — Z7984 Long term (current) use of oral hypoglycemic drugs: Secondary | ICD-10-CM | POA: Diagnosis not present

## 2019-02-13 DIAGNOSIS — M1991 Primary osteoarthritis, unspecified site: Secondary | ICD-10-CM | POA: Diagnosis not present

## 2019-02-13 DIAGNOSIS — E119 Type 2 diabetes mellitus without complications: Secondary | ICD-10-CM | POA: Diagnosis not present

## 2019-02-13 DIAGNOSIS — Z9181 History of falling: Secondary | ICD-10-CM | POA: Diagnosis not present

## 2019-02-13 DIAGNOSIS — H409 Unspecified glaucoma: Secondary | ICD-10-CM | POA: Diagnosis not present

## 2019-02-13 DIAGNOSIS — I2510 Atherosclerotic heart disease of native coronary artery without angina pectoris: Secondary | ICD-10-CM | POA: Diagnosis not present

## 2019-02-13 DIAGNOSIS — Z6833 Body mass index (BMI) 33.0-33.9, adult: Secondary | ICD-10-CM | POA: Diagnosis not present

## 2019-02-13 DIAGNOSIS — Z87891 Personal history of nicotine dependence: Secondary | ICD-10-CM | POA: Diagnosis not present

## 2019-02-13 DIAGNOSIS — I4821 Permanent atrial fibrillation: Secondary | ICD-10-CM | POA: Diagnosis not present

## 2019-02-16 ENCOUNTER — Other Ambulatory Visit (INDEPENDENT_AMBULATORY_CARE_PROVIDER_SITE_OTHER): Payer: PPO

## 2019-02-16 ENCOUNTER — Encounter: Payer: Self-pay | Admitting: Internal Medicine

## 2019-02-16 ENCOUNTER — Ambulatory Visit (INDEPENDENT_AMBULATORY_CARE_PROVIDER_SITE_OTHER): Payer: PPO | Admitting: Internal Medicine

## 2019-02-16 ENCOUNTER — Other Ambulatory Visit: Payer: Self-pay

## 2019-02-16 VITALS — BP 130/70 | HR 106 | Temp 99.5°F | Ht 62.0 in | Wt 178.0 lb

## 2019-02-16 DIAGNOSIS — I509 Heart failure, unspecified: Secondary | ICD-10-CM

## 2019-02-16 DIAGNOSIS — R0602 Shortness of breath: Secondary | ICD-10-CM

## 2019-02-16 DIAGNOSIS — M545 Low back pain, unspecified: Secondary | ICD-10-CM

## 2019-02-16 DIAGNOSIS — E1159 Type 2 diabetes mellitus with other circulatory complications: Secondary | ICD-10-CM | POA: Diagnosis not present

## 2019-02-16 DIAGNOSIS — G8929 Other chronic pain: Secondary | ICD-10-CM

## 2019-02-16 DIAGNOSIS — K625 Hemorrhage of anus and rectum: Secondary | ICD-10-CM

## 2019-02-16 DIAGNOSIS — F5101 Primary insomnia: Secondary | ICD-10-CM | POA: Diagnosis not present

## 2019-02-16 DIAGNOSIS — I4821 Permanent atrial fibrillation: Secondary | ICD-10-CM

## 2019-02-16 LAB — COMPREHENSIVE METABOLIC PANEL
ALT: 14 U/L (ref 0–35)
AST: 17 U/L (ref 0–37)
Albumin: 3.9 g/dL (ref 3.5–5.2)
Alkaline Phosphatase: 86 U/L (ref 39–117)
BUN: 20 mg/dL (ref 6–23)
CO2: 27 mEq/L (ref 19–32)
Calcium: 9 mg/dL (ref 8.4–10.5)
Chloride: 88 mEq/L — ABNORMAL LOW (ref 96–112)
Creatinine, Ser: 0.78 mg/dL (ref 0.40–1.20)
GFR: 72.95 mL/min (ref >60.00)
Glucose, Bld: 220 mg/dL — ABNORMAL HIGH (ref 70–99)
Potassium: 3.9 mEq/L (ref 3.5–5.1)
Sodium: 124 mEq/L — ABNORMAL LOW (ref 135–145)
Total Bilirubin: 0.7 mg/dL (ref 0.2–1.2)
Total Protein: 8 g/dL (ref 6.0–8.3)

## 2019-02-16 LAB — HEMOGLOBIN A1C: Hgb A1c MFr Bld: 7 % — ABNORMAL HIGH (ref 4.6–6.5)

## 2019-02-16 LAB — CBC
HCT: 31.6 % — ABNORMAL LOW (ref 36.0–46.0)
Hemoglobin: 10 g/dL — ABNORMAL LOW (ref 12.0–15.0)
MCHC: 31.8 g/dL (ref 30.0–36.0)
MCV: 72.3 fl — ABNORMAL LOW (ref 78.0–100.0)
Platelets: 338 10*3/uL (ref 150.0–400.0)
RBC: 4.36 Mil/uL (ref 3.87–5.11)
RDW: 19.9 % — ABNORMAL HIGH (ref 11.5–15.5)
WBC: 17.1 10*3/uL — ABNORMAL HIGH (ref 4.0–10.5)

## 2019-02-16 LAB — BRAIN NATRIURETIC PEPTIDE: Pro B Natriuretic peptide (BNP): 443 pg/mL — ABNORMAL HIGH (ref 0.0–100.0)

## 2019-02-16 LAB — FERRITIN: Ferritin: 57.9 ng/mL (ref 10.0–291.0)

## 2019-02-16 MED ORDER — TRAZODONE HCL 100 MG PO TABS: 50.0000 mg | ORAL_TABLET | Freq: Every day | ORAL | 1 refills | Status: DC

## 2019-02-16 NOTE — Assessment & Plan Note (Signed)
No current symptoms. We did discuss this history in relevance to her request to take celebrex sometimes with her concurrent xarelto. We discussed risk of GI bleeding. She has current anemia and checking CBC today.

## 2019-02-16 NOTE — Assessment & Plan Note (Signed)
Checking HgA1c as none in some time due to other acute health problems. No new complications and stable prior complications of PVD and CAD. Taking invokana and janumet. Adjust as needed.

## 2019-02-16 NOTE — Progress Notes (Signed)
Subjective:   Patient ID: [name], female    DOB: [birth year], 68 y.o.   MRN: [id number]  HPI The patient is a 67 YO female coming in for hospital follow up (in the hospital end of May for pericardial effusion which was drained, diuretics adjusted). She has followed up with cardiology and seems to be stable from that with echo 01/29/19 without accumulation of fluid pericardial. She was given 10 day course of antibiotics for possible cellulitis on the leg about 2 weeks ago. She is having improvement in the leg but still some swelling in the left leg. She is having some tenderness also. She is under a lot of stress right now. She is trying to downsize and move into space about half current size. This is very hard for her to think about and adjust to. Denies fevers or chills at home. A lot of fatigue still. She is unable to go back to work due to 16 steps to her office at work. There is not a ground floor space available. Weight is stable. Did fall out of bed yesterday and hit her head. Denies LOC. Denies bleeding or injury. Denies headaches or migraines. Denies vision changes. Denies nausea or vomiting. She is having problems sleeping recently. She is taking her requip which helps with her restless leg. She is taking ativan for stress and this is helping but she does not always use it for sleep. She denies waking up a lot but has a hard time getting to sleep. She is also struggling with ability to take celebrex and xarelto and she has been off this. It is very hard for her to get around. She took 1 celebrex yesterday and this helped her a lot. She wants to go back to at least taking this sometimes.   PMH, [facility], social history reviewed and updated  Review of Systems  Constitutional: Positive for activity change, appetite change and fatigue. Negative for chills, fever and unexpected weight change.  HENT: Negative.   Eyes: Negative.   Respiratory: Positive for shortness of breath. Negative for cough  and chest tightness.   Cardiovascular: Positive for leg swelling. Negative for chest pain and palpitations.  Gastrointestinal: Positive for abdominal distention. Negative for abdominal pain, anal bleeding, blood in stool, constipation, diarrhea, nausea, rectal pain and vomiting.  Musculoskeletal: Positive for arthralgias, back pain, gait problem and myalgias.  Skin: Negative.   Neurological: Negative for dizziness, tremors, syncope, weakness, light-headedness and headaches.       Recent fall  Psychiatric/Behavioral: Positive for decreased concentration, dysphoric mood and sleep disturbance.    Objective:  Physical Exam Constitutional:      Appearance: She is well-developed. She is obese.  HENT:     Head: Normocephalic and atraumatic.  Neck:     Musculoskeletal: Normal range of motion.  Cardiovascular:     Rate and Rhythm: Normal rate.     Comments: irreg irreg Pulmonary:     Effort: Pulmonary effort is normal. No respiratory distress.     Breath sounds: Normal breath sounds. No wheezing or rales.     Comments: Some dyspnea with walking and some with talking long sentences Abdominal:     General: Bowel sounds are normal. There is distension.     Palpations: Abdomen is soft.     Tenderness: There is no abdominal tenderness. There is no rebound.  Musculoskeletal:     Left lower leg: Edema present.     Comments: Left leg with 1+ pitting edema, right leg  without edema  Skin:    General: Skin is warm and dry.  Neurological:     Mental Status: She is alert and oriented to person, place, and time.     Coordination: Coordination normal.     Vitals:   02/16/19 0920  BP: 130/70  Pulse: (!) 106  Temp: 99.5 F (37.5 C)  TempSrc: Oral  SpO2: 99%  Weight: 178 lb (80.7 kg)  Height: 5\' 2"  (1.575 m)    Assessment & Plan:  Visit time 40 minutes: greater than 50% of that time was spent in face to face counseling and coordination of care with the patient: counseled about many complex  medical issues including her sleep, recent pericardial effusion, CHF, functional status, stress, celebrex

## 2019-02-16 NOTE — Patient Instructions (Signed)
We will check the blood work today and call you back about the results.   We have sent in trazodone to take 1/2 to 1 pill at night time for sleep.

## 2019-02-16 NOTE — Assessment & Plan Note (Signed)
Checking CBC, CMP, BNP today.

## 2019-02-16 NOTE — Assessment & Plan Note (Signed)
Rx for trazodone. She is using ativan for sleep also and taking requip for RLS.

## 2019-02-16 NOTE — Assessment & Plan Note (Signed)
Taking vicodin chronically for this and has used celebrex BID for years. She wants to resume this. We did discuss risk for GI bleeding and prior GI bleeding. She is willing to accept this risk for QOL provided by celebrex.

## 2019-02-16 NOTE — Assessment & Plan Note (Signed)
In a fib today and rate at goal.

## 2019-02-16 NOTE — Assessment & Plan Note (Signed)
Stable from prior, she is still struggling to recover from pericardial effusion and drainage. She has chronic edema.

## 2019-02-19 ENCOUNTER — Encounter: Payer: Self-pay | Admitting: Internal Medicine

## 2019-02-22 ENCOUNTER — Encounter: Payer: Self-pay | Admitting: Internal Medicine

## 2019-02-22 NOTE — Progress Notes (Signed)
Cardiology Office Note   Date:  02/23/2019   ID:  [name], DOB 01/05/1951, MRN [id number]  PCP:  Hoyt Koch, MD  Cardiologist: Dr. Gwenlyn Found No chief complaint on file.    History of Present Illness: [name] is a 68 y.o. female who presents for ongoing assessment and management of chronic diastolic heart failure, history of CAD, hypertension, diabetes, and pericardial effusion.  The patient had a pericardiocentesis on 01/13/2019, which was very bloody and Eliquis was not restarted.  She also has a history of admission for decompensated diastolic heart failure during that admission.  Follow-up echocardiogram dated 01/29/2019 did not reveal pericardial effusion with a normal EF of 60% to 65% with mild LVH, normal wall motion, severe LAE, MAC with trivial MR, and trivial AI.  She was placed back on anticoagulation therapy with Xarelto.  On last office visit dated 02/02/2019 she complained of having rapid heart rates at home requiring her to take extra doses of metoprolol.  She had been on metoprolol 50 mg 3 times daily before she was hospitalized in June and requested to go back on this dose that she was having to take extra doses.  I allowed her to go back up to metoprolol 3 times daily, at 50 mg.  She was also noted to have cellulitis of her right lower extremity with erythematous skin and an open sore.  I plan on putting her on Keflex 500 mg twice daily but she wanted to take a lower dose as higher dose caused significant diarrhea in the past therefore she was placed on 250 mg twice daily for 10 days  She followed up with her primary care physician Dr. Sharlet Salina on 02/16/2019 and was complaining of a lot of stress currently downsizing to a smaller living situation, she fell out of bed and hit her head but there was no bleeding or bruising.  She requested to stop taking Celebrex every day because she was also on Xarelto and was afraid of GI bleeding.  Follow-up labs were completed to  include a CBC and a BMET.  Labs are reviewed prior to this office visit and were found to be abnormal.  White blood cells were elevated at 17.1, she continued to be anemic but was improved with a hemoglobin of 10.0 and hematocrit of 31.6.  Pro BN P was elevated at 443.  She was hyponatremic with a sodium of 124 which was consistent with prior labs 1 month earlier.  Glucose was elevated at 220 kidney function was normal at 0.78.  Hemoglobin A1c was elevated at 7.0.   She has had significant improvement in her lower extremity cellulitis.  She is feeling better.  She denies any further complaints of pain in her leg.  Breathing status is about the same.  She is yet to have some support hose ordered but she has been measured for them.   Past Medical History:  Diagnosis Date  . Anemia, unspecified   . Arthritis   . Asthma   . Atrial fibrillation with RVR ([place])    a. on Xarelto  . Bell's palsy    Facial nerve decompression in 2001  . CHF (congestive heart failure) ([place])   . Chronic low back pain   . COPD with asthma ([place])   . Coronary artery disease    Myoview 04/12/11 was entirely normal. ECHO 02/26/08 showed only minor abnormalities. Stenting 05/26/08 of her posterolateral branch to the left circumflex coronary artery. Used a 2.5x76m Taxus Monorail stent.myoview  2014 was without ischemia  . Diabetes mellitus    Type 2  . Early cataracts, bilateral   . Fatty liver   . GERD (gastroesophageal reflux disease)   . Glaucoma   . Glaucoma   . Goiter   . Heart murmur   . History of nuclear stress test 2012; 2014   lexiscan; normal pattern of perfusion; normal, low risk scan   . Hyperlipidemia   . Hypertension   . Panic disorder   . Pneumonia 2008  . Polycystic ovary disease    Hysterectomy in 1982 for this  . Shortness of breath dyspnea    ECHO 02/26/08 showed only minor abnormalities  . Spinal stenosis     Past Surgical History:  Procedure Laterality Date  . ABDOMINAL HYSTERECTOMY  1982    & BSO; for polycystic ovary disease  . CARDIOVERSION N/A 12/17/2013   Procedure: CARDIOVERSION;  Surgeon: Pixie Casino, MD;  Location: [facility] ENDOSCOPY;  Service: Cardiovascular;  Laterality: N/A;  . CARDIOVERSION N/A 09/30/2015   Procedure: CARDIOVERSION;  Surgeon: Dorothy Spark, MD;  Location: [facility] ENDOSCOPY;  Service: Cardiovascular;  Laterality: N/A;  . CARDIOVERSION N/A 06/22/2016   Procedure: CARDIOVERSION;  Surgeon: Skeet Latch, MD;  Location: [facility] ENDOSCOPY;  Service: Cardiovascular;  Laterality: N/A;  . CENTRAL LINE INSERTION  01/13/2019   Procedure: CENTRAL LINE INSERTION;  Surgeon: Jolaine Artist, MD;  Location: [place] CV LAB;  Service: Cardiovascular;;  . COLONOSCOPY     last 2009; Dr Cristina Gong; due 2019  . COLONOSCOPY WITH PROPOFOL N/A 01/23/2018   Procedure: COLONOSCOPY WITH PROPOFOL;  Surgeon: Ronald Lobo, MD;  Location: [place];  Service: Endoscopy;  Laterality: N/A;  . CORONARY ANGIOPLASTY  05/26/2008   Stenting of her posterolateral branch to the left circumflex coronary artery. Used a 2.5x56m Taxus Monorail stent.  . ESOPHAGOGASTRODUODENOSCOPY (EGD) WITH PROPOFOL N/A 01/23/2018   Procedure: ESOPHAGOGASTRODUODENOSCOPY (EGD) WITH PROPOFOL;  Surgeon: BRonald Lobo MD;  [place]  Service: Endoscopy;  Laterality: N/A;  . FACIAL NERVE DECOMPRESSION  2001/2002   bells palsy   . LAPAROSCOPIC CHOLECYSTECTOMY  06/15/2011    Dr IDalbert Batman . PERICARDIOCENTESIS N/A 01/13/2019   Procedure: PERICARDIOCENTESIS;  Surgeon: MBurnell Blanks MD;  [facility];  Service: Cardiovascular;  Laterality: N/A;  . RIGHT AND LEFT HEART CATH N/A 01/13/2019   Procedure: RIGHT AND LEFT HEART CATH;  Surgeon: BJolaine Artist MD;  [facility];  Service: Cardiovascular;  Laterality: N/A;  . TEE WITHOUT CARDIOVERSION N/A 12/17/2013   Procedure: TRANSESOPHAGEAL ECHOCARDIOGRAM (TEE);  Surgeon: KPixie Casino MD;  [facility];  Service: Cardiovascular;  Laterality: N/A;  trish/ja  . TRANSTHORACIC ECHOCARDIOGRAM  71/69/6789  LV systolic function normal with mild conc LVH; LA mildly dilated; trace MR/TR  . UPPER GI ENDOSCOPY  2009   negative     Current Outpatient Medications  Medication Sig Dispense Refill  . ascorbic acid (VITAMIN C) 1000 MG tablet Take 1,000 mg by mouth 2 (two) times daily.     .Marland KitchenCALCIUM-MAG-VIT C-VIT D PO Take 1 tablet by mouth daily.     . Continuous Blood Gluc Sensor (FREESTYLE LIBRE SENSOR SYSTEM) MISC Use to monitor sugars 1 each 0  . diltiazem (CARDIZEM CD) 180 MG 24 hr capsule TAKE 1 CAPSULE DAILY. (Patient taking differently: Take 180 mg by mouth daily. ) 90 capsule 3  . gabapentin (NEURONTIN) 100 MG capsule TAKE (1) CAPSULE THREE TIMES DAILY. (Patient taking differently: Take  100 mg by mouth 3 (three) times daily. ) 180 capsule 3  . guaiFENesin (MUCINEX) 600 MG 12 hr tablet Take 600 mg by mouth 2 (two) times daily as needed for cough.     [name] HYDROcodone-acetaminophen (NORCO/VICODIN) 5-325 MG tablet Take 1 tablet by mouth at bedtime. 30 tablet 0  . INVOKANA 100 MG TABS tablet TAKE 1 TABLET ONCE DAILY. (Patient taking differently: Take 100 mg by mouth daily before breakfast. ) 90 tablet 1  . isosorbide mononitrate (IMDUR) 30 MG 24 hr tablet Take 0.5 tablets (15 mg total) by mouth daily. 45 tablet 0  . latanoprost (XALATAN) 0.005 % ophthalmic solution Place 1 drop into both eyes at bedtime.    [name] LORazepam (ATIVAN) 0.5 MG tablet TAKE 1 OR 2 TABLETS AT BEDTIME. (Patient taking differently: Take 0.5-1 mg by mouth at bedtime. ) 60 tablet 3  . Melatonin 5 MG TABS Take 2-3 tablets by mouth at bedtime.     . metoprolol succinate (TOPROL-XL) 25 MG 24 hr tablet Take 2 tablets (50 mg total) by mouth 3 (three) times daily. May also take 1 tablet (25 mg total) as needed (INCREASED HR). Do all this for 30 days. Take as needed for palpitations. 30 tablet   . Multiple Vitamins-Iron (MULTIVITAMINS  WITH IRON) TABS Take 1 tablet by mouth daily.     [name] nystatin ointment (MYCOSTATIN) Apply 1 application topically 2 (two) times daily as needed (IRRITATION).   0  . omeprazole (PRILOSEC) 20 MG capsule Take 1 capsule (20 mg total) by mouth 2 (two) times daily before a meal.    . ONETOUCH DELICA LANCETS FINE MISC 1 Units by Does not apply route as directed. 100 each 3  . PARoxetine (PAXIL) 10 MG tablet TAKE 2 AND 1/2 TABS DAILY (Patient taking differently: Take 25 mg by mouth daily. ) 225 tablet 3  . potassium chloride (MICRO-K) 10 MEQ CR capsule Take 2 capsules (20 mEq total) by mouth daily. 60 capsule 3  . rivaroxaban (XARELTO) 20 MG TABS tablet Take 1 tablet (20 mg total) by mouth daily with supper. 30 tablet 6  . rOPINIRole (REQUIP XL) 2 MG 24 hr tablet TAKE ONE TABLET AT BEDTIME. (Patient taking differently: Take 2 mg by mouth at bedtime. ) 30 tablet 5  . rOPINIRole (REQUIP) 0.5 MG tablet Take 1 to 2 tab at midnight. ( XL form at 6 PM) (Patient taking differently: Take 0.5-1 mg by mouth daily as needed (restless legs). ) 60 tablet 6  . senna (SENOKOT) 8.6 MG TABS Take 1 tablet by mouth at bedtime.     . sitaGLIPtin-metformin (JANUMET) 50-1000 MG tablet TAKE 1/2 TABLET IN THE MORNING WITH BREAKFAST AND 1 TABLET IN THE EVENING WITH SUPPER. 45 tablet 3  . Tiotropium Bromide-Olodaterol (STIOLTO RESPIMAT) 2.5-2.5 MCG/ACT AERS Inhale 2 puffs into the lungs daily. 1 Inhaler 1  . torsemide (DEMADEX) 20 MG tablet Take 2 tablets (40 mg total) by mouth 2 (two) times daily. (after 2-3 days, please resume 20 mg twice daily) 90 tablet 3  . traZODone (DESYREL) 100 MG tablet Take 0.5-1 tablets (50-100 mg total) by mouth at bedtime. 90 tablet 1   No current facility-administered medications for this visit.     Allergies:   Benadryl [diphenhydramine hcl], Clopidogrel bisulfate, and Contrast media [iodinated diagnostic agents]    Social History:  The patient  reports that she quit smoking about 25 years ago. Her  smoking use included cigarettes. She has a 45.00 pack-year smoking history. She  has never used smokeless tobacco. She reports that she does not drink alcohol or use drugs.   Family History:  The patient's family history includes Cancer in her brother and sister; Colon cancer (age of onset: 20) in her brother; Colon polyps in her sister; Emphysema (age of onset: 57) in her mother; Gout in her brother; Heart attack (age of onset: 45) in her father; Hyperlipidemia in her brother; Hypertension in her brother; Pneumonia in her maternal grandmother; Ulcers in her mother.    ROS: All other systems are reviewed and negative. Unless otherwise mentioned in H&P    PHYSICAL EXAM: VS:  BP 136/75   Pulse (!) 107   Temp (!) 97.2 F (36.2 C)   Ht _0  (1.575 m)   Wt 177 lb 6.4 oz (80.5 kg)   SpO2 100%   BMI 32.45 kg/m  , BMI Body mass index is 32.45 kg/m. GEN: Well nourished, well developed, in no acute distress HEENT: normal Neck: no JVD, carotid bruits, or masses Cardiac: IRRR; no murmurs, rubs, or gallops,no edema  Respiratory:  Clear to auscultation bilaterally, normal work of breathing GI: soft, nontender, nondistended, + BS MS: no deformity or atrophy.  Erythema markedly improved.  Continues to have some venous stasis skin changes bilaterally to the knee.  Varicosities are noted. Skin: warm and dry, no rash Neuro:  Strength and sensation are intact Psych: euthymic mood, full affect   EKG: Not completed this office visit.  Recent Labs: 10/02/2018: TSH 2.21 01/16/2019: Magnesium 2.1 01/23/2019: B Natriuretic Peptide 317.5 02/16/2019: ALT 14; BUN 20; Creatinine, Ser 0.78; Hemoglobin 10.0; Platelets 338.0; Potassium 3.9; Pro B Natriuretic peptide (BNP) 443.0; Sodium 124    Lipid Panel    Component Value Date/Time   CHOL 118 10/02/2018 1203   TRIG 123.0 10/02/2018 1203   HDL 51.10 10/02/2018 1203   CHOLHDL 2 10/02/2018 1203   VLDL 24.6 10/02/2018 1203   LDLCALC 42 10/02/2018 1203    LDLDIRECT 143.4 01/23/2008 1316      Wt Readings from Last 3 Encounters:  02/23/19 177 lb 6.4 oz (80.5 kg)  02/16/19 178 lb (80.7 kg)  02/02/19 180 lb 3.2 oz (81.7 kg)      Other studies Reviewed: Echocardiogram February 28, 2019 1. The left ventricle has normal systolic function with an ejection fraction of 60-65%. The cavity size was normal. There is mildly increased left ventricular wall thickness. Left ventricular diastolic function could not be evaluated secondary to atrial fibrillation. No evidence of left ventricular regional wall motion abnormalities. 2. The right ventricle has normal systolic function. The cavity was normal. There is no increase in right ventricular wall thickness. 3. Left atrial size was severely dilated. 4. The mitral valve is abnormal. Mild thickening of the mitral valve leaflet. There is mild to moderate mitral annular calcification present. 5. The aortic valve is tricuspid. Mild sclerosis of the aortic valve. Aortic valve regurgitation is trivial by color flow Doppler. No stenosis of the aortic valve. 6. The inferior vena cava was dilated in size with <50% respiratory variability. Pericardium: There is no evidence of pericardial effusion.   ASSESSMENT AND PLAN:  1.  Atrial fibrillation: Heart rate is currently well controlled on diltiazem 180 mg 24-hour capsule, and Xarelto 20 mg daily.  She is tolerating the diltiazem well as a long-acting dose, and metoprolol 50 mg 3 times daily therefore I will continue this as prescribed.  2.  Chronic diastolic CHF: No evidence of volume overload at this time.  She will continue  current medication regimen.  She is currently not on diuretic.  3.  Cellulitis of the right lower extremity.  Marked improvement.  She will follow-up with PCP for ongoing evaluation.  She is to wear support hose to assist with venous stasis.  4.  Anemia: Hemoglobin, mproved from 7.5-10.0.  Followed by PCP  Current medicines are reviewed at  length with the patient today.  She would like to change from Dr. Alvester Chou to Dr. criteria to be followed by cardiology.  I will send a note to both physicians to inform.  Labs/ tests ordered today include: None.  Phill Myron. West Pugh, ANP, AACC   02/23/2019 3:49 PM    [facility] [address] Office [phone] Fax [phone]

## 2019-02-23 ENCOUNTER — Ambulatory Visit (INDEPENDENT_AMBULATORY_CARE_PROVIDER_SITE_OTHER): Payer: PPO | Admitting: Adult Health

## 2019-02-23 ENCOUNTER — Encounter: Payer: Self-pay | Admitting: Adult Health

## 2019-02-23 ENCOUNTER — Other Ambulatory Visit: Payer: Self-pay

## 2019-02-23 VITALS — BP 136/75 | HR 107 | Temp 97.2°F | Ht 62.0 in | Wt 177.4 lb

## 2019-02-23 DIAGNOSIS — I4819 Other persistent atrial fibrillation: Secondary | ICD-10-CM | POA: Diagnosis not present

## 2019-02-23 DIAGNOSIS — R0602 Shortness of breath: Secondary | ICD-10-CM | POA: Diagnosis not present

## 2019-02-23 DIAGNOSIS — I5032 Chronic diastolic (congestive) heart failure: Secondary | ICD-10-CM

## 2019-02-23 DIAGNOSIS — Z79899 Other long term (current) drug therapy: Secondary | ICD-10-CM | POA: Diagnosis not present

## 2019-02-23 MED ORDER — TORSEMIDE 20 MG PO TABS: 40.0000 mg | ORAL_TABLET | Freq: Two times a day (BID) | ORAL | 3 refills | Status: DC

## 2019-02-23 NOTE — Patient Instructions (Signed)
Medication Instructions:  Continue current medications  If you need a refill on your cardiac medications before your next appointment, please call your pharmacy.  Labwork: BMP and BNP in 1 Month HERE IN OUR OFFICE AT [facility]  You will need to fast. DO NOT EAT OR DRINK PAST MIDNIGHT.     You will NOT need to fast   Take the provided lab slips with you to the lab for your blood draw.   When you have your labs (blood work) drawn today and your tests are completely normal, you will receive your results only by MyChart Message (if you have MyChart) -OR-  A paper copy in the mail.  If you have any lab test that is abnormal or we need to change your treatment, we will call you to review these results.  Testing/Procedures: None Ordered  Follow-Up: . Your physician recommends that you schedule a follow-up appointment in: 3 Months with Dr croitoru   At [facility], you and your health needs are our priority.  As part of our continuing mission to provide you with exceptional heart care, we have created designated Provider Care Teams.  These Care Teams include your primary Cardiologist (physician) and Advanced Practice Providers (APPs -  Physician Assistants and Nurse Practitioners) who all work together to provide you with the care you need, when you need it.  Thank you for choosing [facility] at [facility]!!

## 2019-02-26 ENCOUNTER — Other Ambulatory Visit: Payer: Self-pay | Admitting: Neurology

## 2019-02-26 DIAGNOSIS — I5033 Acute on chronic diastolic (congestive) heart failure: Secondary | ICD-10-CM | POA: Diagnosis not present

## 2019-02-26 DIAGNOSIS — Z9181 History of falling: Secondary | ICD-10-CM | POA: Diagnosis not present

## 2019-02-26 DIAGNOSIS — Z87891 Personal history of nicotine dependence: Secondary | ICD-10-CM | POA: Diagnosis not present

## 2019-02-26 DIAGNOSIS — E119 Type 2 diabetes mellitus without complications: Secondary | ICD-10-CM | POA: Diagnosis not present

## 2019-02-26 DIAGNOSIS — J449 Chronic obstructive pulmonary disease, unspecified: Secondary | ICD-10-CM | POA: Diagnosis not present

## 2019-02-26 DIAGNOSIS — H409 Unspecified glaucoma: Secondary | ICD-10-CM | POA: Diagnosis not present

## 2019-02-26 DIAGNOSIS — Z6833 Body mass index (BMI) 33.0-33.9, adult: Secondary | ICD-10-CM | POA: Diagnosis not present

## 2019-02-26 DIAGNOSIS — I4821 Permanent atrial fibrillation: Secondary | ICD-10-CM | POA: Diagnosis not present

## 2019-02-26 DIAGNOSIS — F410 Panic disorder [episodic paroxysmal anxiety] without agoraphobia: Secondary | ICD-10-CM | POA: Diagnosis not present

## 2019-02-26 DIAGNOSIS — Z7901 Long term (current) use of anticoagulants: Secondary | ICD-10-CM | POA: Diagnosis not present

## 2019-02-26 DIAGNOSIS — M4800 Spinal stenosis, site unspecified: Secondary | ICD-10-CM | POA: Diagnosis not present

## 2019-02-26 DIAGNOSIS — H269 Unspecified cataract: Secondary | ICD-10-CM | POA: Diagnosis not present

## 2019-02-26 DIAGNOSIS — I110 Hypertensive heart disease with heart failure: Secondary | ICD-10-CM | POA: Diagnosis not present

## 2019-02-26 DIAGNOSIS — M1991 Primary osteoarthritis, unspecified site: Secondary | ICD-10-CM | POA: Diagnosis not present

## 2019-02-26 DIAGNOSIS — N390 Urinary tract infection, site not specified: Secondary | ICD-10-CM | POA: Diagnosis not present

## 2019-02-26 DIAGNOSIS — K760 Fatty (change of) liver, not elsewhere classified: Secondary | ICD-10-CM | POA: Diagnosis not present

## 2019-02-26 DIAGNOSIS — I2510 Atherosclerotic heart disease of native coronary artery without angina pectoris: Secondary | ICD-10-CM | POA: Diagnosis not present

## 2019-02-26 DIAGNOSIS — Z7984 Long term (current) use of oral hypoglycemic drugs: Secondary | ICD-10-CM | POA: Diagnosis not present

## 2019-03-02 ENCOUNTER — Telehealth: Payer: Self-pay | Admitting: Neurology

## 2019-03-02 ENCOUNTER — Encounter: Payer: Self-pay | Admitting: Neurology

## 2019-03-02 NOTE — Telephone Encounter (Signed)
Called the patient and advised that with her hitting her head, it may be worth getting her in sooner since she is having some memory concerns in case Dr Brett Fairy wants to order imaging. I have placed her on the schedule for thur 03/05/2019 at 1:00 pm and advised the patient to check in by 12:30 pm. Pt verbalized understanding.

## 2019-03-05 ENCOUNTER — Encounter: Payer: Self-pay | Admitting: Neurology

## 2019-03-05 ENCOUNTER — Telehealth: Payer: Self-pay | Admitting: Neurology

## 2019-03-05 ENCOUNTER — Ambulatory Visit (INDEPENDENT_AMBULATORY_CARE_PROVIDER_SITE_OTHER): Payer: PPO | Admitting: Neurology

## 2019-03-05 ENCOUNTER — Other Ambulatory Visit: Payer: Self-pay

## 2019-03-05 VITALS — BP 132/78 | HR 102 | Temp 97.3°F | Ht 62.0 in | Wt 177.0 lb

## 2019-03-05 DIAGNOSIS — I509 Heart failure, unspecified: Secondary | ICD-10-CM | POA: Diagnosis not present

## 2019-03-05 DIAGNOSIS — J449 Chronic obstructive pulmonary disease, unspecified: Secondary | ICD-10-CM | POA: Diagnosis not present

## 2019-03-05 DIAGNOSIS — I313 Pericardial effusion (noninflammatory): Secondary | ICD-10-CM | POA: Diagnosis not present

## 2019-03-05 DIAGNOSIS — I480 Paroxysmal atrial fibrillation: Secondary | ICD-10-CM

## 2019-03-05 DIAGNOSIS — R601 Generalized edema: Secondary | ICD-10-CM | POA: Diagnosis not present

## 2019-03-05 DIAGNOSIS — Z9861 Coronary angioplasty status: Secondary | ICD-10-CM

## 2019-03-05 DIAGNOSIS — I2510 Atherosclerotic heart disease of native coronary artery without angina pectoris: Secondary | ICD-10-CM | POA: Diagnosis not present

## 2019-03-05 DIAGNOSIS — I3139 Other pericardial effusion (noninflammatory): Secondary | ICD-10-CM

## 2019-03-05 DIAGNOSIS — S0990XA Unspecified injury of head, initial encounter: Secondary | ICD-10-CM

## 2019-03-05 NOTE — Telephone Encounter (Signed)
Order corrected for the pt

## 2019-03-05 NOTE — Telephone Encounter (Signed)
Health team order sent to GI. No auth they will reach out to the patient to schedule.  

## 2019-03-05 NOTE — Patient Instructions (Signed)
Concussion, Adult  A concussion is a brain injury from a hard, direct hit (trauma) to your head or body. This direct hit causes the brain to quickly shake back and forth inside the skull. A concussion may also be called a mild traumatic brain injury (TBI). Healing from this injury can take time. What are the causes? This condition is caused by:  A direct hit to your head, such as: ? Running into a player during a game. ? Being hit in a fight. ? Hitting your head on a hard surface.  A quick and sudden movement (jolt) of the head or neck, such as in a car crash. What are the signs or symptoms? The signs of a concussion can be hard to notice. They may be missed by you, family members, and doctors. You may look fine on the outside but may not act or feel normal. Physical symptoms  Headaches.  Being tired (fatigued).  Being dizzy.  Problems with body balance.  Problems seeing or hearing.  Being sensitive to light or noise.  Feeling sick to your stomach (nausea) or throwing up (vomiting).  Not sleeping or eating as you used to.  Loss of feeling (numbness) or tingling in the body.  Seizure. Mental and emotional symptoms  Problems remembering things.  Trouble focusing your mind (concentrating), organizing, or making decisions.  Being slow to think, act, react, speak, or read.  Feeling grouchy (irritable).  Having mood changes.  Feeling worried or nervous (anxious).  Feeling sad (depressed). How is this treated? This condition may be treated by:  Stopping sports or activity if you are injured. If you hit your head or have signs of concussion: ? Do not return to sports or activities the same day. ? Get checked by a doctor before you return to your activities.  Resting your body and your mind.  Being watched carefully, often at home.  Medicines to help with symptoms such as: ? Feeling sick to your stomach. ? Headaches. ? Problems with sleep.  Avoid taking strong  pain medicines (opioids) for a concussion.  Avoiding alcohol and drugs.  Being asked to go to a concussion clinic or a place to help you recover (rehabilitation center). Recovery from a concussion can take time. Return to activities only:  When you are fully healed.  When your doctor says it is safe. Follow these instructions at home: Activity  Limit activities that need a lot of thought or focus, such as: ? Homework or work for your job. ? Watching TV. ? Using the computer or phone. ? Playing memory games and puzzles.  Rest. Rest helps your brain heal. Make sure you: ? Get plenty of sleep. Most adults should get 7-9 hours of sleep each night. ? Rest during the day. Take naps or breaks when you feel tired.  Avoid activity like exercise until your doctor says its safe. Stop any activity that makes symptoms worse.  Do not do activities that could cause a second concussion, such as riding a bike or playing sports.  Ask your doctor when you can return to your normal activities, such as school, work, sports, and driving. Your ability to react may be slower. Do not do these activities if you are dizzy. General instructions   Take over-the-counter and prescription medicines only as told by your doctor.  Do not drink alcohol until your doctor says you can.  Watch your symptoms and tell other people to do the same. Other problems can occur after a concussion. Older  adults have a higher risk of serious problems.  Tell your work Freight forwarder, teachers, Government social research officer, school counselor, coach, or Product/process development scientist about your injury and symptoms. Tell them about what you can or cannot do.  Keep all follow-up visits as told by your doctor. This is important. How is this prevented?  It is very important that you do not get another brain injury. In rare cases, another injury can cause brain damage that will not go away, brain swelling, or death. The risk of this is greatest in the first 7-10 days  after a head injury. To avoid injuries: ? Stop activities that could lead to a second concussion, such as contact sports, until your doctor says it is okay. ? When you return to sports or activities:  Do not crash into other players. This is how most concussions happen.  Follow the rules.  Respect other players. ? Get regular exercise. Do strength and balance training. ? Wear a helmet that fits you well during sports, biking, or other activities.  Helmets can help protect you from serious skull and brain injuries, but they do not protect you from a concussion. Even when wearing a helmet, you should avoid being hit in the head. Contact a doctor if:  Your symptoms get worse or they do not get better.  You have new symptoms.  You have another injury. Get help right away if:  You have bad headaches or your headaches get worse.  You feel weak or numb in any part of your body.  You are mixed up (confused).  Your balance gets worse.  You keep throwing up.  You feel more sleepy than normal.  Your speech is not clear (is slurred).  You cannot recognize people or places.  You have a seizure.  Others have trouble waking you up.  You have behavior changes.  You have changes in how you see (vision).  You pass out (lose consciousness). Summary  A concussion is a brain injury from a hard, direct hit (trauma) to your head or body.  This condition is treated with rest and careful watching of symptoms.  If you keep having symptoms, call your doctor. This information is not intended to replace advice given to you by your health care provider. Make sure you discuss any questions you have with your health care provider. Document Released: 07/18/2009 Document Revised: 03/20/2018 Document Reviewed: 03/20/2018 Elsevier Patient Education  2020 [employer].

## 2019-03-05 NOTE — Telephone Encounter (Signed)
When you get a chance can you put a new CT order in for the CT Head wo contrast and not have the charm study attached to it. Thank you.

## 2019-03-05 NOTE — Progress Notes (Signed)
SLEEP MEDICINE CLINIC   Provider:  Larey Seat, M D  Referring Provider: Hoyt Koch, * Primary Care Physician:  Hoyt Koch, MD  Chief Complaint  Patient presents with   Follow-up    pt alone, rm 10. pt presetnts today following up from ED, and then a recent injury to head that has caused some concerns    03-05-2019,  [name] is a [age] year old caucasian left handed patient. Presenting for urgent Visit.  Interval history for anew concern of confusion, delayed word retrieval following a fall out of bed, head injury.  She had a headache , a little nausea and about a day later she developed headaches, dizziness and a large hematoma on the right temple.   There has been a progression in her heart failure.  Her interval history is quite remarkable patient informed me in a MyChart message that she had been admitted to [facility] on May 31 after developing increasing shortness of breath and extreme weakness.  She was found to have pulmonary edema, atrial fibrillation and pericardial effusion was evaluated for underlying malignancy.  She had almost 2 L of fluid drained from the pericardium and lives she stated.  She was in the hospital for belly a week about the third week after she returned home she had a fall out of bed.  The CT of the chest abdomen and pelvis was performed without contrast documented small amount of ascites primarily in the pelvis bilateral flank edema anasarca with bilateral pleural effusions, ascites and bilateral flank edema.  A previous CT had already described a similar small nodule to the left adrenal gland this has not changed and is likely incidental.  The previous CT was from 2003.  CT of the chest showed that the heart was mildly enlarged.there was still a small residual pericardial effusion and the mediastinum did not show enlarged lymph nodes there were pleural effusions small left more than right atelectasis in both lower lobes.  No  infiltration, no pancreatic abnormality noted normal spleen, normal bowel anatomy.  The studies are from 14 January 2019. The discharge summary dated 16 January 2019 stated that the patient would be on fluid restriction she was discharged on torsemide, on Cipro, she will be followed for elevated liver function tests-transaminases, and her work-up for anemia will be continued.  At the time there was no neurologic concerns this arose from the fall 3 weeks later.     HPI: 06-10-2017, I had the opportunity of meeting [name] on February 28 last year, about 20 months ago. In the meantime she would like to be further evaluated a past medical history has not significantly changed. Her chief complaints of neither. She continues to be treated on Xarelto for atrial fibrillation with rapid ventricular response, takes Cardizem for rate control, gabapentin, estradiol, Invokana, Symbicort inhaler, Janumet, she still taking Paxil, but she is no longer taking Ultram.  She is still in need of a formal sleep evaluation, her atrial fibrillation has been difficult to control. She has failed 4 cardio-versions. Could not take Tikosyn as it interferes with her paxil.  insisted on continuing Paxil, out of fear to have panic attacks again. 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 She lives with her wife, has a supportive sister. She adopted 2 children.      HPI:  [name] is a 68 y.o. female , seen here as a referral from Dr. Sharlet Salina for a sleep  consultation,   Chief complaint according to patient : " insomnia- difficulties to go to sleep , but I can stay asleep" . 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  [name] is a patient with atrial fibrillation and sleep initiation insomnia, she also had a Bell's palsy in 2001, a cholecystectomy in 2012 cardiac stenting in 2009 and a hysterectomy in 1982. Her past medical history includes hypertension, diabetes, hypercholesterolemia, heart  disease atrial fibrillation, anxiety and migraine. In her review of systems she endorsed further easy bruising, shortness of breath, chest pain and palpitation being aware of a cardiac murmur, restless legs and headaches. It was upon further recommendation of her cardiologist at the atrial fibrillation clinic at [facility] where she was recommended to undergo a sleep study. The patient was educated about the correlation between atrial fibrillation and obstructive sleep apnea. She sees Dr. Quay Burow. She has a panic disorder and takes Ativan.   Paroxysmal atrial fibrillation was first diagnosed in May 2015 with a successful conversion 2 first of May of the same year and after that she relapsed into a fib again in September of the same year. She then suffered in early 2017 recurrent episode and her cardiologist increased her metoprolol 250 minute times a day to control the rate. She underwent another cardioversion in feb 2017 ,  converted on the dose and has been maintaining sinus rhythm for one week.  Sleep habits are as follows: She takes Lorazepam at night and melatonin. She sleeps on her right side. She wakes up prone. Bedroom is cold, quiet and dark.  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 Working 2- 8 PM, late dinner, reading. And going to bed after midnight after some " me time" . She sleeps 7-8 hours and often rises at noon. She sleeps with her spouse, she reportedly kicks her wife a lot.  She often wakes with palpitations and noted a very high blood pressure. She has woken diaphoretic.  Her spouse wakes up "a zillion times" but can go to sleep. Delayed sleep cycle for years . On weekends she sleeps 10-12 hours , 4 AM  until 2 PM. She does not nap in daytime. She does drink coffee after waking up and after work (!).  She wakes with a dry mouth and no headaches.  Sleep medical history and family sleep history: her sister has hypersomnia. Social  history:  Married to same sex partner,  2 daughters 19 and 6. No ETOH, No tobacco, little  caffeine.    01-22-2018, I have the pleasure of seeing Mrs. Judeen Hammans and [place] today patient I had last encounter prior to scheduling a sleep study for her.  She has a history of migraine, persistent atrial fibrillation, coronary artery disease with cardiac stents, obesity, diabetes and hypertension.  She was referred by cardiologist Dr. Alvester Chou and primary care physician Pricilla Holm at the time.  Her sleep study revealed no significant apnea, mild snoring moderate snoring when supine, persistent atrial fibrillation, and severe periodic limb movements with related arousals at 14.6 times per hour of sleep.  I felt strongly that her PLM's are the main culprit also.  Periodic limb movements can be related to medication side effects but my main suspicion was that we need to check for her spinal anatomy, any neuropathy or any iron deficiency.  A ferritin level had to be obtained which was reportedly  very low and the patient was finally treated for iron  deficiency and anemia.  In the meantime before the iron deficiency was evident she had seen Dr. Lynann Bologna,  who followed up with her recently  April 2019 and stated that she had no explanation for the progressive balance loss and pain in the extremities and could not correlate this quite to the mild-spinal stenosis as found between L5 and S1.   The patient states that after she was transfused with 2 units her symptoms improved drastically but now are slowly coming back.  She is scheduled for an upper and lower GI endoscopy tomorrow.  This is meant to discover were blood may be lost. There is blood is in her stool. Dr Wallis Mart had seen her 3 years ago and warned about Gi Bleed. Iron deficiency caused severe RLS, and now she can sit still for a while.  On Requip.    Review of Systems: Out of a complete 14 system review, the patient complains of only the following symptoms,  and all other reviewed systems are negative.  Improved RLS, sleeps only 5-7 hours, and later in daytime.  She l has not slept some night.  FSS  29 down from 61, now up to 48 again  Epworth remained at  7 points.  Geriatric depression score 2   Social History   Socioeconomic History   Marital status: Married    Spouse name: Not on file   Number of children: 2   Years of education: master's   Highest education level: Not on file  Occupational History   Occupation: Electrical engineer    Employer: [employer] resource strain: Not on file   Food insecurity    Worry: Not on file    Inability: Not on file   Transportation needs    Medical: Not on file    Non-medical: Not on file  Tobacco Use   Smoking status: Former Smoker    Packs/day: 1.50    Years: 30.00    Pack years: 45.00    Types: Cigarettes    Quit date: 08/13/1993    Years since quitting: 25.5   Smokeless tobacco: Never Used  Substance and Sexual Activity   Alcohol use: No   Drug use: No   Sexual activity: Not on file  Lifestyle   Physical activity    Days per week: Not on file    Minutes per session: Not on file   Stress: Not on file  Relationships   Social connections    Talks on phone: Not on file    Gets together: Not on file    Attends religious service: Not on file    Active member of club or organization: Not on file    Attends meetings of clubs or organizations: Not on file    Relationship status: Not on file   Intimate partner violence    Fear of current or ex partner: Not on file    Emotionally abused: Not on file    Physically abused: Not on file    Forced sexual activity: Not on file  Other Topics Concern   Not on file  Social History Narrative   Not on file    Family History  Problem Relation Age of Onset   Heart attack Father 34       2nd MI at 38   Colon cancer Brother 62   Gout Brother    Ulcers Mother    Emphysema Mother  87   Colon polyps Sister  Cancer Sister        Basal cell carcinoma   Pneumonia Maternal Grandmother    Hypertension Brother    Hyperlipidemia Brother    Cancer Brother        Skin   Diabetes Neg Hx    Stroke Neg Hx     Past Medical History:  Diagnosis Date   Anemia, unspecified    Arthritis    Asthma    Atrial fibrillation with RVR ([place])    a. on Xarelto   Bell's palsy    Facial nerve decompression in 2001   CHF (congestive heart failure) (HCC)    Chronic low back pain    COPD with asthma ([place])    Coronary artery disease    Myoview 04/12/11 was entirely normal. ECHO 02/26/08 showed only minor abnormalities. Stenting 05/26/08 of her posterolateral branch to the left circumflex coronary artery. Used a 2.5x68mm Taxus Monorail stent.myoview 2014 was without ischemia   Diabetes mellitus    Type 2   Early cataracts, bilateral    Fatty liver    GERD (gastroesophageal reflux disease)    Glaucoma    Glaucoma    Goiter    Heart murmur    History of nuclear stress test 2012; 2014   lexiscan; normal pattern of perfusion; normal, low risk scan    Hyperlipidemia    Hypertension    Panic disorder    Pneumonia 2008   Polycystic ovary disease    Hysterectomy in 1982 for this   Shortness of breath dyspnea    ECHO 02/26/08 showed only minor abnormalities   Spinal stenosis     Past Surgical History:  Procedure Laterality Date   ABDOMINAL HYSTERECTOMY  1982   & BSO; for polycystic ovary disease   CARDIOVERSION N/A 12/17/2013   Procedure: CARDIOVERSION;  Surgeon: Pixie Casino, MD;  Location: [place];  Service: Cardiovascular;  Laterality: N/A;   CARDIOVERSION N/A 09/30/2015   Procedure: CARDIOVERSION;  Surgeon: Dorothy Spark, MD;  Location: [place];  Service: Cardiovascular;  Laterality: N/A;   CARDIOVERSION N/A 06/22/2016   Procedure: CARDIOVERSION;  Surgeon: Skeet Latch, MD;  Location: [place];  Service:  Cardiovascular;  Laterality: N/A;   CENTRAL LINE INSERTION  01/13/2019   Procedure: CENTRAL LINE INSERTION;  Surgeon: Jolaine Artist, MD;  Location: [place] [facility];  Service: Cardiovascular;;   COLONOSCOPY     last 2009; Dr Cristina Gong; due 2019   COLONOSCOPY WITH PROPOFOL N/A 01/23/2018   Procedure: COLONOSCOPY WITH PROPOFOL;  Surgeon: Ronald Lobo, MD;  Location: [place];  Service: Endoscopy;  Laterality: N/A;   CORONARY ANGIOPLASTY  05/26/2008   Stenting of her posterolateral branch to the left circumflex coronary artery. Used a 2.5x20mm Taxus Monorail stent.   ESOPHAGOGASTRODUODENOSCOPY (EGD) WITH PROPOFOL N/A 01/23/2018   Procedure: ESOPHAGOGASTRODUODENOSCOPY (EGD) WITH PROPOFOL;  Surgeon: Ronald Lobo, MD;  Location: [place];  Service: Endoscopy;  Laterality: N/A;   FACIAL NERVE DECOMPRESSION  2001/2002   bells palsy    LAPAROSCOPIC CHOLECYSTECTOMY  06/15/2011    Dr Dalbert Batman   PERICARDIOCENTESIS N/A 01/13/2019   Procedure: PERICARDIOCENTESIS;  Surgeon: Burnell Blanks, MD;  Location: [place] [facility];  Service: Cardiovascular;  Laterality: N/A;   RIGHT AND LEFT HEART CATH N/A 01/13/2019   Procedure: RIGHT AND LEFT HEART CATH;  Surgeon: Jolaine Artist, MD;  Location: [place] [facility];  Service: Cardiovascular;  Laterality: N/A;   TEE WITHOUT CARDIOVERSION N/A 12/17/2013   Procedure: TRANSESOPHAGEAL ECHOCARDIOGRAM (TEE);  Surgeon:  Pixie Casino, MD;  Location: [facility] ENDOSCOPY;  Service: Cardiovascular;  Laterality: N/A;  trish/ja   TRANSTHORACIC ECHOCARDIOGRAM  3/81/0175   LV systolic function normal with mild conc LVH; LA mildly dilated; trace MR/TR   UPPER GI ENDOSCOPY  2009   negative    Current Outpatient Medications  Medication Sig Dispense Refill   ascorbic acid (VITAMIN C) 1000 MG tablet Take 1,000 mg by mouth 2 (two) times daily.      CALCIUM-MAG-VIT C-VIT D PO Take 1 tablet by mouth daily.      Continuous Blood Gluc Sensor (FREESTYLE  LIBRE SENSOR SYSTEM) MISC Use to monitor sugars 1 each 0   diltiazem (CARDIZEM CD) 180 MG 24 hr capsule TAKE 1 CAPSULE DAILY. (Patient taking differently: Take 180 mg by mouth daily. ) 90 capsule 3   gabapentin (NEURONTIN) 100 MG capsule TAKE (1) CAPSULE THREE TIMES DAILY. (Patient taking differently: Take 100 mg by mouth 3 (three) times daily. ) 180 capsule 3   guaiFENesin (MUCINEX) 600 MG 12 hr tablet Take 600 mg by mouth 2 (two) times daily as needed for cough.      HYDROcodone-acetaminophen (NORCO/VICODIN) 5-325 MG tablet Take 1 tablet by mouth at bedtime. 30 tablet 0   INVOKANA 100 MG TABS tablet TAKE 1 TABLET ONCE DAILY. (Patient taking differently: Take 100 mg by mouth daily before breakfast. ) 90 tablet 1   isosorbide mononitrate (IMDUR) 30 MG 24 hr tablet Take 0.5 tablets (15 mg total) by mouth daily. 45 tablet 0   latanoprost (XALATAN) 0.005 % ophthalmic solution Place 1 drop into both eyes at bedtime.     LORazepam (ATIVAN) 0.5 MG tablet TAKE 1 OR 2 TABLETS AT BEDTIME. (Patient taking differently: Take 0.5-1 mg by mouth at bedtime. ) 60 tablet 3   Melatonin 5 MG TABS Take 2-3 tablets by mouth at bedtime.      Multiple Vitamins-Iron (MULTIVITAMINS WITH IRON) TABS Take 1 tablet by mouth daily.      nystatin ointment (MYCOSTATIN) Apply 1 application topically 2 (two) times daily as needed (IRRITATION).   0   omeprazole (PRILOSEC) 20 MG capsule Take 1 capsule (20 mg total) by mouth 2 (two) times daily before a meal.     ONETOUCH DELICA LANCETS FINE MISC 1 Units by Does not apply route as directed. 100 each 3   PARoxetine (PAXIL) 10 MG tablet TAKE 2 AND 1/2 TABS DAILY (Patient taking differently: Take 25 mg by mouth daily. ) 225 tablet 3   potassium chloride (MICRO-K) 10 MEQ CR capsule Take 2 capsules (20 mEq total) by mouth daily. 60 capsule 3   rivaroxaban (XARELTO) 20 MG TABS tablet Take 1 tablet (20 mg total) by mouth daily with supper. 30 tablet 6   rOPINIRole (REQUIP XL)  2 MG 24 hr tablet TAKE ONE TABLET AT BEDTIME. 30 tablet 0   rOPINIRole (REQUIP) 0.5 MG tablet Take 1 to 2 tab at midnight. ( XL form at 6 PM) (Patient taking differently: Take 0.5-1 mg by mouth daily as needed (restless legs). ) 60 tablet 6   senna (SENOKOT) 8.6 MG TABS Take 1 tablet by mouth at bedtime.      sitaGLIPtin-metformin (JANUMET) 50-1000 MG tablet TAKE 1/2 TABLET IN THE MORNING WITH BREAKFAST AND 1 TABLET IN THE EVENING WITH SUPPER. 45 tablet 3   Tiotropium Bromide-Olodaterol (STIOLTO RESPIMAT) 2.5-2.5 MCG/ACT AERS Inhale 2 puffs into the lungs daily. 1 Inhaler 1   torsemide (DEMADEX) 20 MG tablet Take 2 tablets (40 mg  total) by mouth 2 (two) times daily. (after 2-3 days, please resume 20 mg twice daily) 90 tablet 3   traZODone (DESYREL) 100 MG tablet Take 0.5-1 tablets (50-100 mg total) by mouth at bedtime. 90 tablet 1   metoprolol succinate (TOPROL-XL) 25 MG 24 hr tablet Take 2 tablets (50 mg total) by mouth 3 (three) times daily. May also take 1 tablet (25 mg total) as needed (INCREASED HR). Do all this for 30 days. Take as needed for palpitations. 30 tablet    No current facility-administered medications for this visit.     Allergies as of 03/05/2019 - Review Complete 03/05/2019  Allergen Reaction Noted   Benadryl [diphenhydramine hcl] Other (See Comments) 06/15/2011   Clopidogrel bisulfate Nausea Only    Contrast media [iodinated diagnostic agents] Palpitations 06/15/2011    Vitals: BP 132/78    Pulse (!) 102    Temp (!) 97.3 F (36.3 C)    Ht 5\' 2"  (1.575 m)    Wt 177 lb (80.3 kg)    BMI 32.37 kg/m  Last Weight:  Wt Readings from Last 1 Encounters:  03/05/19 177 lb (80.3 kg)   XNA:TFTD mass index is 32.37 kg/m.     Last Height:   Ht Readings from Last 1 Encounters:  03/05/19 5\' 2"  (1.575 m)    Physical exam:  General: The patient is awake, alert and appears not in acute distress. The patient is obese. Head: Normocephalic, atraumatic. Neck is supple.  Mallampati 3  neck circumference:16.25 . Nasal airflow intact , TMJ is evident . Retrognathia is seen.   Cardiovascular:  irregular rate and rhythm Respiratory: Lungs are clear to auscultation. Skin:  Without evidence of edema, or rash Trunk: BMI is elevated . Neurologic exam : The patient is awake and alert, oriented to place and time.   She is very pale.  Memory subjective  described as intact. Attention span & concentration ability appears normal.  No flowsheet data found. MOCA 29/30    Speech is fluent, without dysarthria, dysphonia or aphasia.  Mood and affect are appropriate.  Cranial nerves: Pupils are equal and briskly reactive to light. Funduscopic exam deferred. Extraocular movements in vertical and horizontal planes intact and without nystagmus. Visual fields by finger perimetry are intact.Hearing to finger rub intact. Facial sensation intact to fine touch. Facial motor strength is symmetric and tongue and uvula move midline.  Shoulder shrug was symmetrical.  Motor exam:  Normal tone, muscle bulk and symmetric strength in all extremities. Sensory:  Fine touch, pinprick and vibration in the upper extremities was normal. Coordination: Finger-to-nose maneuver  without evidence of ataxia, dysmetria or tremor. Gait and station: Patient walks without assistive device. Strength within normal limits.Stance is stable and normal.Tandem gait is unfragmented. Turns with 3 Steps.  Deep tendon reflexes: in the upper and lower extremities are symmetric and intact. Babinski maneuver response is downgoing.  We are ordering a CT and repeated  MMSE/ MOCA.   Assessment:  After physical and neurologic examination, review of laboratory studies,  Personal review of imaging studies, reports of other /same  Imaging studies ,  Results of polysomnography/ neurophysiology testing and pre-existing records as far as provided in visit., my assessment is :  1) transient impaired memory, balance - likely  suffered a mild concussion. She is anticoagulated - she could have developed SDH.  Her memory and word retrieval is improving.   2) Anasarka, pericardial effusion - related to viral infection ? No malignancy found.  Her  CHF related  to Atrial fibrillation, persistent  -She cannot undergo ablation while in almost constant atrial fibrillation- and her a fib has taken her energy away, her stamina.  3)  PLMs, RLS -  Can be partly due to spinal stenosis, but she responded well to Requip and transfusions.    Plan:  Treatment plan and additional workup :  Postconcussion- with trail making test difficulties. Was verbally fluent , but had difficulties with rather simple instructions, was easily distracted. No obvious visual or auditory impairment.   Fluid restrictions are very difficult for her during this hot summer weather, encourage sipping on ice. .   The patient may need a walker- works better than she can lean on a shopping cart, in my book this means that she would benefit from a walker which she does not like.  This will stabilize gait. She had PT , completed.     Asencion Partridge Bita Cartwright MD  03/05/2019   CC: Hoyt Koch, Md [place],  [place] [id number]

## 2019-03-09 ENCOUNTER — Ambulatory Visit (INDEPENDENT_AMBULATORY_CARE_PROVIDER_SITE_OTHER): Payer: PPO

## 2019-03-09 ENCOUNTER — Other Ambulatory Visit: Payer: Self-pay

## 2019-03-09 ENCOUNTER — Encounter: Payer: Self-pay | Admitting: Podiatry

## 2019-03-09 ENCOUNTER — Ambulatory Visit: Payer: PPO | Admitting: Podiatry

## 2019-03-09 ENCOUNTER — Other Ambulatory Visit: Payer: Self-pay | Admitting: Podiatry

## 2019-03-09 VITALS — Temp 98.2°F

## 2019-03-09 DIAGNOSIS — M778 Other enthesopathies, not elsewhere classified: Secondary | ICD-10-CM

## 2019-03-09 DIAGNOSIS — M779 Enthesopathy, unspecified: Secondary | ICD-10-CM

## 2019-03-09 DIAGNOSIS — M25572 Pain in left ankle and joints of left foot: Secondary | ICD-10-CM

## 2019-03-09 DIAGNOSIS — E1140 Type 2 diabetes mellitus with diabetic neuropathy, unspecified: Secondary | ICD-10-CM

## 2019-03-09 DIAGNOSIS — M2142 Flat foot [pes planus] (acquired), left foot: Secondary | ICD-10-CM

## 2019-03-09 DIAGNOSIS — M2141 Flat foot [pes planus] (acquired), right foot: Secondary | ICD-10-CM

## 2019-03-09 DIAGNOSIS — M21619 Bunion of unspecified foot: Secondary | ICD-10-CM

## 2019-03-09 DIAGNOSIS — E1149 Type 2 diabetes mellitus with other diabetic neurological complication: Secondary | ICD-10-CM

## 2019-03-10 NOTE — Progress Notes (Addendum)
Subjective:   Patient ID: [name], female   DOB: 68 y.o.   MRN: [id number]   HPI Patient presents stating getting pain on top of both feet knowing that this is only temporary what we do but it is better than nothing and it does help her.  Also using cream and here to see ped orthotist.  Patient also complains of structural bunion deformity with flatfoot deformity and does have long-term diabetes with moderate neuropathic symptomatology   ROS      Objective:  Physical Exam  Neurovascular status intact with inflammation and pain of the extensor complex bilateral with patient noted to have orthotics which are starting to wear out.  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  Also I do think orthotics will be necessary due to structural abnormalities the flatfoot deformity and also chronic neuropathic condition  X-rays dated today did indicate there is quite a bit of arthritis but I did not note increase in spur size or indications of stress fracture

## 2019-03-18 ENCOUNTER — Ambulatory Visit
Admission: RE | Admit: 2019-03-18 | Discharge: 2019-03-18 | Disposition: A | Discharge status: Home / Self Care | Payer: PPO | Source: Ambulatory Visit | Attending: Neurology | Admitting: Neurology

## 2019-03-18 DIAGNOSIS — S0990XA Unspecified injury of head, initial encounter: Secondary | ICD-10-CM

## 2019-03-18 DIAGNOSIS — R42 Dizziness and giddiness: Secondary | ICD-10-CM | POA: Diagnosis not present

## 2019-03-18 DIAGNOSIS — R51 Headache: Secondary | ICD-10-CM | POA: Diagnosis not present

## 2019-03-19 NOTE — Progress Notes (Signed)
CT head without contrast showed no acute changes, chronic age-related changes were seen.  Please update patient.

## 2019-03-23 ENCOUNTER — Encounter: Payer: Self-pay | Admitting: Neurology

## 2019-03-23 ENCOUNTER — Other Ambulatory Visit (HOSPITAL_COMMUNITY): Payer: Self-pay

## 2019-03-23 ENCOUNTER — Telehealth: Payer: Self-pay | Admitting: Neurology

## 2019-03-23 MED ORDER — TORSEMIDE 20 MG PO TABS: 40.0000 mg | ORAL_TABLET | Freq: Two times a day (BID) | ORAL | 3 refills | Status: DC

## 2019-03-23 NOTE — Telephone Encounter (Signed)
Called to review the pt CT head results. There was no answer. LVM informing the patient the results came back normal and there were no acute changes. LVM for the pt to reach out and give me a call with whatever questions that she may have.

## 2019-03-23 NOTE — Telephone Encounter (Signed)
-----   Message from Star Age, MD sent at 03/19/2019  4:55 PM EDT ----- CT head without contrast showed no acute changes, chronic age-related changes were seen.  Please update patient.

## 2019-03-24 ENCOUNTER — Ambulatory Visit: Payer: PPO | Admitting: Orthotics

## 2019-03-24 ENCOUNTER — Other Ambulatory Visit: Payer: Self-pay

## 2019-03-24 DIAGNOSIS — M778 Other enthesopathies, not elsewhere classified: Secondary | ICD-10-CM

## 2019-03-24 DIAGNOSIS — M25572 Pain in left ankle and joints of left foot: Secondary | ICD-10-CM

## 2019-03-30 ENCOUNTER — Other Ambulatory Visit: Payer: Self-pay | Admitting: Internal Medicine

## 2019-04-02 ENCOUNTER — Other Ambulatory Visit: Payer: Self-pay

## 2019-04-02 DIAGNOSIS — Z9861 Coronary angioplasty status: Secondary | ICD-10-CM

## 2019-04-02 DIAGNOSIS — I509 Heart failure, unspecified: Secondary | ICD-10-CM

## 2019-04-02 DIAGNOSIS — I3139 Other pericardial effusion (noninflammatory): Secondary | ICD-10-CM

## 2019-04-02 DIAGNOSIS — R0602 Shortness of breath: Secondary | ICD-10-CM | POA: Diagnosis not present

## 2019-04-02 DIAGNOSIS — I313 Pericardial effusion (noninflammatory): Secondary | ICD-10-CM

## 2019-04-02 DIAGNOSIS — I2510 Atherosclerotic heart disease of native coronary artery without angina pectoris: Secondary | ICD-10-CM

## 2019-04-02 DIAGNOSIS — R601 Generalized edema: Secondary | ICD-10-CM

## 2019-04-02 DIAGNOSIS — I480 Paroxysmal atrial fibrillation: Secondary | ICD-10-CM

## 2019-04-02 DIAGNOSIS — J449 Chronic obstructive pulmonary disease, unspecified: Secondary | ICD-10-CM

## 2019-04-02 DIAGNOSIS — Z79899 Other long term (current) drug therapy: Secondary | ICD-10-CM | POA: Diagnosis not present

## 2019-04-03 LAB — BASIC METABOLIC PANEL
BUN/Creatinine Ratio: 22 (ref 12–28)
BUN: 27 mg/dL (ref 8–27)
CO2: 21 mmol/L (ref 20–29)
Calcium: 9 mg/dL (ref 8.7–10.3)
Chloride: 88 mmol/L — ABNORMAL LOW (ref 96–106)
Creatinine, Ser: 1.24 mg/dL — ABNORMAL HIGH (ref 0.57–1.00)
GFR calc Af Amer: 51 mL/min/{1.73_m2} — ABNORMAL LOW (ref >59)
GFR calc non Af Amer: 44 mL/min/{1.73_m2} — ABNORMAL LOW (ref >59)
Glucose: 153 mg/dL — ABNORMAL HIGH (ref 65–99)
Potassium: 4.6 mmol/L (ref 3.5–5.2)
Sodium: 128 mmol/L — ABNORMAL LOW (ref 134–144)

## 2019-04-03 LAB — BRAIN NATRIURETIC PEPTIDE: BNP: 149.8 pg/mL — ABNORMAL HIGH (ref 0.0–100.0)

## 2019-04-06 ENCOUNTER — Telehealth: Payer: Self-pay | Admitting: *Deleted

## 2019-04-06 DIAGNOSIS — Z79899 Other long term (current) drug therapy: Secondary | ICD-10-CM

## 2019-04-06 NOTE — Telephone Encounter (Signed)
Result reviewed in MyChart, lab slip mailed to pt

## 2019-04-06 NOTE — Telephone Encounter (Signed)
-----   Message from Lendon Colonel, NP sent at 04/04/2019 11:29 AM EDT ----- Creatinine elevated compared to one month ago. Likely from the torsemide dose. Will follow this. Repeat BMET in one month.

## 2019-04-09 ENCOUNTER — Other Ambulatory Visit: Payer: Self-pay | Admitting: Internal Medicine

## 2019-04-09 NOTE — Progress Notes (Signed)
Adjusted f/o to her satisfaction.

## 2019-04-10 NOTE — Telephone Encounter (Signed)
Control database checked last refill: 02/23/2019 60 tabs LOV:02/16/2019 [id number]

## 2019-04-13 ENCOUNTER — Other Ambulatory Visit: Payer: Self-pay

## 2019-04-13 ENCOUNTER — Ambulatory Visit: Payer: PPO | Admitting: Orthotics

## 2019-04-13 DIAGNOSIS — Q6650 Congenital pes planus, unspecified foot: Secondary | ICD-10-CM

## 2019-04-13 DIAGNOSIS — E1142 Type 2 diabetes mellitus with diabetic polyneuropathy: Secondary | ICD-10-CM

## 2019-04-13 DIAGNOSIS — M2041 Other hammer toe(s) (acquired), right foot: Secondary | ICD-10-CM

## 2019-04-14 NOTE — Progress Notes (Signed)
Patient was measured for med necessity extra depth shoes (x2) w/ 3 pair (x6) custom f/o. Patient was measured w/ brannock to determine size and width.  Foam impression mold was achieved and deemed appropriate for fabrication of  cmfo.   See DPM chart notes for further documentation and dx codes for determination of medical necessity.  Appropriate forms will be sent to PCP to verify and sign off on medical necessity.   Shoes ordered [employer]Marland Kitchenconfirmation [id number] Inserts Richy

## 2019-04-17 ENCOUNTER — Other Ambulatory Visit: Payer: Self-pay | Admitting: Neurology

## 2019-04-26 ENCOUNTER — Other Ambulatory Visit: Payer: Self-pay | Admitting: Internal Medicine

## 2019-04-27 ENCOUNTER — Ambulatory Visit (HOSPITAL_COMMUNITY)
Admission: RE | Admit: 2019-04-27 | Discharge: 2019-04-27 | Disposition: A | Discharge status: Home / Self Care | Payer: PPO | Source: Ambulatory Visit | Attending: Internal Medicine | Admitting: Internal Medicine

## 2019-04-27 ENCOUNTER — Other Ambulatory Visit: Payer: Self-pay

## 2019-04-27 VITALS — BP 138/74 | HR 98 | Wt 183.1 lb

## 2019-04-27 DIAGNOSIS — H409 Unspecified glaucoma: Secondary | ICD-10-CM | POA: Insufficient documentation

## 2019-04-27 DIAGNOSIS — J449 Chronic obstructive pulmonary disease, unspecified: Secondary | ICD-10-CM | POA: Insufficient documentation

## 2019-04-27 DIAGNOSIS — Z6833 Body mass index (BMI) 33.0-33.9, adult: Secondary | ICD-10-CM | POA: Diagnosis not present

## 2019-04-27 DIAGNOSIS — I5032 Chronic diastolic (congestive) heart failure: Secondary | ICD-10-CM | POA: Diagnosis not present

## 2019-04-27 DIAGNOSIS — K760 Fatty (change of) liver, not elsewhere classified: Secondary | ICD-10-CM | POA: Insufficient documentation

## 2019-04-27 DIAGNOSIS — Z7901 Long term (current) use of anticoagulants: Secondary | ICD-10-CM | POA: Insufficient documentation

## 2019-04-27 DIAGNOSIS — E871 Hypo-osmolality and hyponatremia: Secondary | ICD-10-CM | POA: Insufficient documentation

## 2019-04-27 DIAGNOSIS — E282 Polycystic ovarian syndrome: Secondary | ICD-10-CM | POA: Insufficient documentation

## 2019-04-27 DIAGNOSIS — Z7984 Long term (current) use of oral hypoglycemic drugs: Secondary | ICD-10-CM | POA: Insufficient documentation

## 2019-04-27 DIAGNOSIS — Z87891 Personal history of nicotine dependence: Secondary | ICD-10-CM | POA: Diagnosis not present

## 2019-04-27 DIAGNOSIS — I2510 Atherosclerotic heart disease of native coronary artery without angina pectoris: Secondary | ICD-10-CM | POA: Diagnosis not present

## 2019-04-27 DIAGNOSIS — I110 Hypertensive heart disease with heart failure: Secondary | ICD-10-CM | POA: Diagnosis not present

## 2019-04-27 DIAGNOSIS — E119 Type 2 diabetes mellitus without complications: Secondary | ICD-10-CM | POA: Diagnosis not present

## 2019-04-27 DIAGNOSIS — I482 Chronic atrial fibrillation, unspecified: Secondary | ICD-10-CM | POA: Diagnosis not present

## 2019-04-27 DIAGNOSIS — Z79899 Other long term (current) drug therapy: Secondary | ICD-10-CM | POA: Insufficient documentation

## 2019-04-27 DIAGNOSIS — I3139 Other pericardial effusion (noninflammatory): Secondary | ICD-10-CM

## 2019-04-27 DIAGNOSIS — K219 Gastro-esophageal reflux disease without esophagitis: Secondary | ICD-10-CM | POA: Diagnosis not present

## 2019-04-27 DIAGNOSIS — I313 Pericardial effusion (noninflammatory): Secondary | ICD-10-CM

## 2019-04-27 DIAGNOSIS — Z8249 Family history of ischemic heart disease and other diseases of the circulatory system: Secondary | ICD-10-CM | POA: Diagnosis not present

## 2019-04-27 DIAGNOSIS — E785 Hyperlipidemia, unspecified: Secondary | ICD-10-CM | POA: Diagnosis not present

## 2019-04-27 NOTE — Progress Notes (Signed)
PCP: Dr Wynona Luna Cardiology: Dr Recardo Evangelist (previously Dr. Gwenlyn Found) Primary HF Cardiologist: Dr Haroldine Laws   HPI: [name] is a 68 y.o. female with a hx of chronic diastolic congestive heart failure,  CAD, DM, HTN, and recent pericardial effusion.    Admitted 01/12/19 with increased shortness of breath. Hospital course complicated by pericardiocentesis on 01/13/19. ECHO completed and showed large pericardial effusion with tamponade. Pericardiocentesis was very bloody so eliquis was not restarted. Diuresed with IV lasix and transitioned to torsemide 40 mg daily. Eliquis was not restarted at discharge.  Discharged on 6/5 with weight of 184 pounds.   Echo 6/18 EF 60-65% no recurrent effusion  Today she returns for routine f/u. Overall doing fairly well. Says 2-3 days ago started getting some tightness - which she feels is just tension. Not reproducible with exertion. Chronic exertional dyspnea worse with steps. 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 Weight 175-178  ROS: All systems negative except as listed in HPI, PMH and Problem List.  SH:  Social History   Socioeconomic History  . Marital status: Married    Spouse name: Not on file  . Number of children: 2  . Years of education: master's  . Highest education level: Not on file  Occupational History  . Occupation: Forensic scientist: [employer]  . Financial resource strain: Not on file  . Food insecurity    Worry: Not on file    Inability: Not on file  . Transportation needs    Medical: Not on file    Non-medical: Not on file  Tobacco Use  . Smoking status: Former Smoker    Packs/day: 1.50    Years: 30.00    Pack years: 45.00    Types: Cigarettes    Quit date: 08/13/1993    Years since quitting: 25.7  . Smokeless tobacco: Never Used  Substance and Sexual Activity  . Alcohol use: No  . Drug use: No  . Sexual activity: Not on file  Lifestyle  . Physical activity    Days per week: Not on file   Minutes per session: Not on file  . Stress: Not on file  Relationships  . Social Herbalist on phone: Not on file    Gets together: Not on file    Attends religious service: Not on file    Active member of club or organization: Not on file    Attends meetings of clubs or organizations: Not on file    Relationship status: Not on file  . Intimate partner violence    Fear of current or ex partner: Not on file    Emotionally abused: Not on file    Physically abused: Not on file    Forced sexual activity: Not on file  Other Topics Concern  . Not on file  Social History Narrative  . Not on file    FH:  Family History  Problem Relation Age of Onset  . Heart attack Father 68       2nd MI at 53  . Colon cancer Brother 56  . Gout Brother   . Ulcers Mother   . Emphysema Mother 105  . Colon polyps Sister   . Cancer Sister        Basal cell carcinoma  . Pneumonia Maternal Grandmother   . Hypertension Brother   . Hyperlipidemia Brother   . Cancer Brother        Skin  . Diabetes Neg  Hx   . Stroke Neg Hx     Past Medical History:  Diagnosis Date  . Anemia, unspecified   . Arthritis   . Asthma   . Atrial fibrillation with RVR ([place])    a. on Xarelto  . Bell's palsy    Facial nerve decompression in 2001  . CHF (congestive heart failure) ([place])   . Chronic low back pain   . COPD with asthma ([place])   . Coronary artery disease    Myoview 04/12/11 was entirely normal. ECHO 02/26/08 showed only minor abnormalities. Stenting 05/26/08 of her posterolateral branch to the left circumflex coronary artery. Used a 2.5x65mm Taxus Monorail stent.myoview 2014 was without ischemia  . Diabetes mellitus    Type 2  . Early cataracts, bilateral   . Fatty liver   . GERD (gastroesophageal reflux disease)   . Glaucoma   . Glaucoma   . Goiter   . Heart murmur   . History of nuclear stress test 2012; 2014   lexiscan; normal pattern of perfusion; normal, low risk scan   . Hyperlipidemia    . Hypertension   . Panic disorder   . Pneumonia 2008  . Polycystic ovary disease    Hysterectomy in 1982 for this  . Shortness of breath dyspnea    ECHO 02/26/08 showed only minor abnormalities  . Spinal stenosis     Current Outpatient Medications  Medication Sig Dispense Refill  . ascorbic acid (VITAMIN C) 1000 MG tablet Take 1,000 mg by mouth 2 (two) times daily.     [name] CALCIUM-MAG-VIT C-VIT D PO Take 1 tablet by mouth daily.     . Continuous Blood Gluc Sensor (FREESTYLE LIBRE SENSOR SYSTEM) MISC Use to monitor sugars 1 each 0  . diltiazem (CARDIZEM CD) 180 MG 24 hr capsule TAKE 1 CAPSULE DAILY. 90 capsule 3  . gabapentin (NEURONTIN) 100 MG capsule TAKE (1) CAPSULE THREE TIMES DAILY. (Patient taking differently: Take 100 mg by mouth 3 (three) times daily. ) 180 capsule 3  . guaiFENesin (MUCINEX) 600 MG 12 hr tablet Take 600 mg by mouth 2 (two) times daily as needed for cough.     [name] HYDROcodone-acetaminophen (NORCO/VICODIN) 5-325 MG tablet TAKE ONE TABLET AT BEDTIME. 30 tablet 0  . INVOKANA 100 MG TABS tablet TAKE 1 TABLET ONCE DAILY. 90 tablet 1  . latanoprost (XALATAN) 0.005 % ophthalmic solution Place 1 drop into both eyes at bedtime.    [name] LORazepam (ATIVAN) 0.5 MG tablet TAKE 1 OR 2 TABLETS AT BEDTIME. (Patient taking differently: Take 0.5-1 mg by mouth at bedtime. ) 60 tablet 3  . Melatonin 5 MG TABS Take 2-3 tablets by mouth at bedtime.     . metoprolol succinate (TOPROL-XL) 50 MG 24 hr tablet Take 150 mg by mouth daily. Take with or immediately following a meal, take extra 25 mg at night if needed    . Multiple Vitamins-Iron (MULTIVITAMINS WITH IRON) TABS Take 1 tablet by mouth daily.     [name] nystatin ointment (MYCOSTATIN) Apply 1 application topically 2 (two) times daily as needed (IRRITATION).   0  . omeprazole (PRILOSEC) 20 MG capsule Take 1 capsule (20 mg total) by mouth 2 (two) times daily before a meal.    . ONETOUCH DELICA LANCETS FINE MISC 1 Units by Does not apply route as  directed. 100 each 3  . PARoxetine (PAXIL) 10 MG tablet TAKE 2 AND 1/2 TABS DAILY (Patient taking differently: Take 25 mg by mouth daily. ) 225 tablet  3  . potassium chloride (MICRO-K) 10 MEQ CR capsule Take 2 capsules (20 mEq total) by mouth daily. 60 capsule 3  . rivaroxaban (XARELTO) 20 MG TABS tablet Take 1 tablet (20 mg total) by mouth daily with supper. 30 tablet 6  . rOPINIRole (REQUIP XL) 2 MG 24 hr tablet TAKE ONE TABLET AT BEDTIME. 30 tablet 0  . rOPINIRole (REQUIP) 0.5 MG tablet TAKE 1 TO 2 TABLETS AT MIDNIGHT. 60 tablet 5  . senna (SENOKOT) 8.6 MG TABS Take 1 tablet by mouth at bedtime.     . sitaGLIPtin-metformin (JANUMET) 50-1000 MG tablet TAKE 1/2 TABLET IN THE MORNING WITH BREAKFAST AND 1 TABLET IN THE EVENING WITH SUPPER. 45 tablet 3  . Tiotropium Bromide-Olodaterol (STIOLTO RESPIMAT) 2.5-2.5 MCG/ACT AERS Inhale 2 puffs into the lungs daily. 1 Inhaler 1  . torsemide (DEMADEX) 20 MG tablet Take 2 tablets (40 mg total) by mouth 2 (two) times daily. 360 tablet 3  . traZODone (DESYREL) 100 MG tablet Take 0.5-1 tablets (50-100 mg total) by mouth at bedtime. 90 tablet 1   No current facility-administered medications for this encounter.     Vitals:   04/27/19 1419  BP: 138/74  Pulse: 98  SpO2: 96%  Weight: 83.1 kg (183 lb 2 oz)   Wt Readings from Last 3 Encounters:  04/27/19 83.1 kg (183 lb 2 oz)  03/05/19 80.3 kg (177 lb)  02/23/19 80.5 kg (177 lb 6.4 oz)    PHYSICAL EXAM: General:  Obese woman in NAD No resp difficulty HEENT: normal Neck: supple. no JVD. Carotids 2+ bilat; no bruits. No lymphadenopathy or thryomegaly appreciated. Cor: PMI nondisplaced. Irregular rate & rhythm. No rubs, gallops or murmurs. Lungs: clear Abdomen: obese soft, nontender, nondistended. No hepatosplenomegaly. No bruits or masses. Good bowel sounds. Extremities: no cyanosis, clubbing, rash, edema severe varicose vein Neuro: alert & orientedx3, cranial nerves grossly intact. moves all 4  extremities w/o difficulty. Affect pleasant   ASSESSMENT & PLAN:  1. H/O Large pericardial effusion with pericardial tamponade - s/p pericardiocentesis 01/13/19. Cytology negative. LDH 1289 pH 7.0 -> exudative. ? Post-viral - Echo 6/18 EF 60-65% no recurrent effusion - repeat echo prior to visit with Dr. Recardo Evangelist  2. Chronic Diastolic HF - Volume status looks good.. Continue torsemide 40 mg twice a day.  - Recent labs reviewed and ok - Encouraged compression stockings  3. CAD - s/p Stenting 05/26/08 of her posterolateral branch to the left circumflex coronary artery. Used a 2.5x66mm Taxus Monorail stent. - Has occasional chest heaviness but not reproducibe - Myoview 2016 was without ischemia - If persists will need coronary angio  4. Hyponatremia - Na 128 on recent labs - Restrict free water. Can use demeclocycline as needed  5. Chronic AF - has been well rate controlled on Toprol 150. Will get ECG today - Increase Toprol as neded - continue xarelto 20 mg daily  No bleeding  6. Morbid obesity Body mass index is 33.49 kg/m.   7. DM2 - continue SGLT2i  Tamponade resolved. She will f/u with Dr. Bonne Dolores in 1 month. Can return to HF clinic as needed.   [name] 2:40 PM

## 2019-04-27 NOTE — Addendum Note (Signed)
Encounter addended by: Marlise Eves, RN on: 04/27/2019 3:16 PM  Actions taken: Diagnosis association updated, Order list changed

## 2019-04-27 NOTE — Patient Instructions (Signed)
EKG done today.  Your physician has requested that you have an echocardiogram. Echocardiography is a painless test that uses sound waves to create images of your heart. It provides your doctor with information about the size and shape of your heart and how well your heart's chambers and valves are working. This procedure takes approximately one hour. There are no restrictions for this procedure.  Please follow up with the [facility] as needed. No need schedule an appointment at this time.  At the [facility], you and your health needs are our priority. As part of our continuing mission to provide you with exceptional heart care, we have created designated Provider Care Teams. These Care Teams include your primary Cardiologist (physician) and Advanced Practice Providers (APPs- Physician Assistants and Nurse Practitioners) who all work together to provide you with the care you need, when you need it.   You may see any of the following providers on your designated Care Team at your next follow up: [name] Dr Glori Bickers . Dr Loralie Champagne . Darrick Grinder, NP   Please be sure to bring in all your medications bottles to every appointment.

## 2019-04-28 NOTE — Telephone Encounter (Signed)
Control database checked last refill:03/14/2019  60 tabs LOV: 02/16/2019 [id number]

## 2019-05-19 ENCOUNTER — Ambulatory Visit (HOSPITAL_COMMUNITY)
Admission: RE | Admit: 2019-05-19 | Discharge: 2019-05-19 | Disposition: A | Discharge status: Home / Self Care | Payer: PPO | Source: Ambulatory Visit | Attending: Internal Medicine | Admitting: Internal Medicine

## 2019-05-19 ENCOUNTER — Other Ambulatory Visit: Payer: Self-pay

## 2019-05-19 DIAGNOSIS — I2510 Atherosclerotic heart disease of native coronary artery without angina pectoris: Secondary | ICD-10-CM | POA: Diagnosis not present

## 2019-05-19 NOTE — Progress Notes (Signed)
  Echocardiogram 2D Echocardiogram has been performed.  Sabine Tenenbaum L Androw 05/19/2019, 4:14 PM

## 2019-05-22 ENCOUNTER — Telehealth (HOSPITAL_COMMUNITY): Payer: Self-pay

## 2019-05-22 NOTE — Telephone Encounter (Signed)
PT contacted the [facility] office stating she would like someone to contact her with the results from her Echo she had on Tuesday.  She stated she has left several messages for the triage nurse but has not received a return call. She is very disturbed that she has not yet received the results as she stated that she normally hears something from the clinical staff within 24 hours of the Echo being completed.  I advised I would route the concern to the clinical staff and request that someone contact her by the end of business today with an update if the results aren't available or the results if they are available, whichever is appropriate.   PT asked that she be contacted @ [phone].  She advised that there is an answering machine on that line and the clinical staff can leave a detailed message if she does not answer.  Thank you!

## 2019-05-22 NOTE — Telephone Encounter (Signed)
Contacted the pt first thing this morning on the pt's preferred number (mobile) and left a voicemail. Called the pt back at the number from [place] and pt answered. Made pt aware that her results are not yet ready. Let her know that we would be contacting her in order to let her know her results. Pt verbalized understanding. Pt also stated that she "forgets" about the mobile number. Will change preferred setting in chart.

## 2019-05-25 ENCOUNTER — Ambulatory Visit: Payer: PPO | Admitting: Neurology

## 2019-05-28 ENCOUNTER — Encounter (HOSPITAL_COMMUNITY): Payer: Self-pay | Admitting: *Deleted

## 2019-05-29 ENCOUNTER — Other Ambulatory Visit: Payer: Self-pay | Admitting: Internal Medicine

## 2019-05-29 ENCOUNTER — Telehealth: Payer: Self-pay | Admitting: Podiatry

## 2019-05-29 ENCOUNTER — Other Ambulatory Visit: Payer: Self-pay | Admitting: Cardiovascular Disease

## 2019-05-29 NOTE — Telephone Encounter (Signed)
Pt left message asking about status of diabetic shoes/inserts..  I returned call andleft message stating we just got the authorization from [employer] today and we should get them in by the end of next week or beginning of the following week but I would call her when they come in to get her scheduled to pick them up.[name]

## 2019-06-04 ENCOUNTER — Encounter: Payer: Self-pay | Admitting: Cardiovascular Disease

## 2019-06-04 ENCOUNTER — Other Ambulatory Visit: Payer: Self-pay

## 2019-06-04 ENCOUNTER — Ambulatory Visit (INDEPENDENT_AMBULATORY_CARE_PROVIDER_SITE_OTHER): Payer: PPO | Admitting: Cardiovascular Disease

## 2019-06-04 ENCOUNTER — Telehealth: Payer: Self-pay | Admitting: Cardiovascular Disease

## 2019-06-04 VITALS — BP 137/78 | HR 106 | Ht 62.0 in | Wt 179.0 lb

## 2019-06-04 DIAGNOSIS — I2510 Atherosclerotic heart disease of native coronary artery without angina pectoris: Secondary | ICD-10-CM | POA: Diagnosis not present

## 2019-06-04 DIAGNOSIS — E785 Hyperlipidemia, unspecified: Secondary | ICD-10-CM | POA: Diagnosis not present

## 2019-06-04 DIAGNOSIS — I10 Essential (primary) hypertension: Secondary | ICD-10-CM

## 2019-06-04 DIAGNOSIS — E1169 Type 2 diabetes mellitus with other specified complication: Secondary | ICD-10-CM

## 2019-06-04 DIAGNOSIS — E669 Obesity, unspecified: Secondary | ICD-10-CM

## 2019-06-04 DIAGNOSIS — D649 Anemia, unspecified: Secondary | ICD-10-CM | POA: Diagnosis not present

## 2019-06-04 DIAGNOSIS — I4811 Longstanding persistent atrial fibrillation: Secondary | ICD-10-CM | POA: Diagnosis not present

## 2019-06-04 DIAGNOSIS — Z79899 Other long term (current) drug therapy: Secondary | ICD-10-CM | POA: Diagnosis not present

## 2019-06-04 NOTE — Patient Instructions (Signed)
Medication Instructions:  No changes *If you need a refill on your cardiac medications before your next appointment, please call your pharmacy*  Lab Work: Your provider would like for you to have the following labs today: CBC  If you have labs (blood work) drawn today and your tests are completely normal, you will receive your results only by: [name] MyChart Message (if you have MyChart) OR . A paper copy in the mail If you have any lab test that is abnormal or we need to change your treatment, we will call you to review the results.  Testing/Procedures: None ordered  Follow-Up: At [facility], you and your health needs are our priority.  As part of our continuing mission to provide you with exceptional heart care, we have created designated Provider Care Teams.  These Care Teams include your primary Cardiologist (physician) and Advanced Practice Providers (APPs -  Physician Assistants and Nurse Practitioners) who all work together to provide you with the care you need, when you need it.  Your next appointment:   Follow up in 2 months with Dr. Sallyanne Kuster  Other Instructions Please call the office or send a MyChart message if your heart rate stays above 100 bpm [phone])

## 2019-06-04 NOTE — Progress Notes (Signed)
Cardiology Office Note:    Date:  06/11/2019   ID:  BISHOP HILLIGOSS, DOB 1951/07/20, MRN [id number]  PCP:  Hoyt Koch, MD  Cardiologist:  New (CHF clinic, [place]) Electrophysiologist:  None   Referring MD: Hoyt Koch, *   Chief Complaint  Patient presents with  . Coronary Artery Disease  . Pericardial Effusion    History of Present Illness:    [name] is a 68 y.o. female with a hx of CAD (posterior lateral ventricular branch stent in 2009), longstanding persistent atrial fibrillation, 3, diabetes mellitus type 2, hypertension, recent pericardiocentesis for hemothorax with tamponade in June 2020, while on treatment with oral anticoagulant.  She has recently "graduated" from the heart failure clinic.  She is a retired Electrical engineer.  She generally been feeling well and has no cardiac complaints.  She is not aware of the abnormal rhythm.  The patient specifically denies any chest pain at rest exertion, dyspnea at rest or with exertion, orthopnea, paroxysmal nocturnal dyspnea, syncope, palpitations, focal neurological deficits, intermittent claudication, lower extremity edema, unexplained weight gain, cough, hemoptysis or wheezing.  Has not had any falls, injuries or bleeding problems.  When she presented with tamponade, she was severely hyponatremic and had acute renal failure.  Recent labs showed improving hyponatremia, sodium now 131.  She has normal renal function parameters and normal potassium.  She was quite anemic with a hemoglobin around 7 in June, but today her hemoglobin is up to 12.0, still with microcytic parameters.  Past Medical History:  Diagnosis Date  . Anemia, unspecified   . Arthritis   . Asthma   . Atrial fibrillation with RVR ([place])    a. on Xarelto  . Bell's palsy    Facial nerve decompression in 2001  . CHF (congestive heart failure) ([place])   . Chronic low back pain   . COPD with asthma ([place])   . Coronary artery disease    Myoview 04/12/11 was entirely normal. ECHO 02/26/08 showed only minor abnormalities. Stenting 05/26/08 of her posterolateral branch to the left circumflex coronary artery. Used a 2.5x60mm Taxus Monorail stent.myoview 2014 was without ischemia  . Diabetes mellitus    Type 2  . Early cataracts, bilateral   . Fatty liver   . GERD (gastroesophageal reflux disease)   . Glaucoma   . Glaucoma   . Goiter   . Heart murmur   . History of nuclear stress test 2012; 2014   lexiscan; normal pattern of perfusion; normal, low risk scan   . Hyperlipidemia   . Hypertension   . Panic disorder   . Pneumonia 2008  . Polycystic ovary disease    Hysterectomy in 1982 for this  . Shortness of breath dyspnea    ECHO 02/26/08 showed only minor abnormalities  . Spinal stenosis     Past Surgical History:  Procedure Laterality Date  . ABDOMINAL HYSTERECTOMY  1982   & BSO; for polycystic ovary disease  . CARDIOVERSION N/A 12/17/2013   Procedure: CARDIOVERSION;  Surgeon: Pixie Casino, MD;  Location: [facility] ENDOSCOPY;  Service: Cardiovascular;  Laterality: N/A;  . CARDIOVERSION N/A 09/30/2015   Procedure: CARDIOVERSION;  Surgeon: Dorothy Spark, MD;  Location: [facility] ENDOSCOPY;  Service: Cardiovascular;  Laterality: N/A;  . CARDIOVERSION N/A 06/22/2016   Procedure: CARDIOVERSION;  Surgeon: Skeet Latch, MD;  Location: [place];  Service: Cardiovascular;  Laterality: N/A;  . CENTRAL LINE INSERTION  01/13/2019   Procedure: CENTRAL LINE INSERTION;  Surgeon: Jolaine Artist, MD;  Location: [place] CV LAB;  Service: Cardiovascular;;  . COLONOSCOPY     last 2009; Dr Cristina Gong; due 2019  . COLONOSCOPY WITH PROPOFOL N/A 01/23/2018   Procedure: COLONOSCOPY WITH PROPOFOL;  Surgeon: Ronald Lobo, MD;  Location: [place];  Service: Endoscopy;  Laterality: N/A;  . CORONARY ANGIOPLASTY  05/26/2008   Stenting of her posterolateral branch to the left circumflex coronary artery. Used a 2.5x27mm Taxus Monorail  stent.  . ESOPHAGOGASTRODUODENOSCOPY (EGD) WITH PROPOFOL N/A 01/23/2018   Procedure: ESOPHAGOGASTRODUODENOSCOPY (EGD) WITH PROPOFOL;  Surgeon: Ronald Lobo, MD;  Location: [place];  Service: Endoscopy;  Laterality: N/A;  . FACIAL NERVE DECOMPRESSION  2001/2002   bells palsy   . LAPAROSCOPIC CHOLECYSTECTOMY  06/15/2011    Dr Dalbert Batman  . PERICARDIOCENTESIS N/A 01/13/2019   Procedure: PERICARDIOCENTESIS;  Surgeon: Burnell Blanks, MD;  Location: [place] CV LAB;  Service: Cardiovascular;  Laterality: N/A;  . RIGHT AND LEFT HEART CATH N/A 01/13/2019   Procedure: RIGHT AND LEFT HEART CATH;  Surgeon: Jolaine Artist, MD;  Location: [place] CV LAB;  Service: Cardiovascular;  Laterality: N/A;  . TEE WITHOUT CARDIOVERSION N/A 12/17/2013   Procedure: TRANSESOPHAGEAL ECHOCARDIOGRAM (TEE);  Surgeon: Pixie Casino, MD;  Location: [facility] ENDOSCOPY;  Service: Cardiovascular;  Laterality: N/A;  trish/ja  . TRANSTHORACIC ECHOCARDIOGRAM  123456   LV systolic function normal with mild conc LVH; LA mildly dilated; trace MR/TR  . UPPER GI ENDOSCOPY  2009   negative    Current Medications: Current Meds  Medication Sig  . ascorbic acid (VITAMIN C) 1000 MG tablet Take 1,000 mg by mouth 2 (two) times daily.   [name] CALCIUM-MAG-VIT C-VIT D PO Take 1 tablet by mouth daily.   . Continuous Blood Gluc Sensor (FREESTYLE LIBRE SENSOR SYSTEM) MISC Use to monitor sugars  . diltiazem (CARDIZEM CD) 180 MG 24 hr capsule TAKE 1 CAPSULE DAILY.  [name] gabapentin (NEURONTIN) 100 MG capsule TAKE (1) CAPSULE THREE TIMES DAILY. (Patient taking differently: Take 100 mg by mouth 3 (three) times daily. )  . guaiFENesin (MUCINEX) 600 MG 12 hr tablet Take 600 mg by mouth 2 (two) times daily as needed for cough.   [name] HYDROcodone-acetaminophen (NORCO/VICODIN) 5-325 MG tablet TAKE ONE TABLET AT BEDTIME.  [name] INVOKANA 100 MG TABS tablet TAKE 1 TABLET ONCE DAILY.  [name] latanoprost (XALATAN) 0.005 % ophthalmic solution Place 1 drop  into both eyes at bedtime.  [name] LORazepam (ATIVAN) 0.5 MG tablet TAKE 1 OR 2 TABLETS AT BEDTIME.  . Melatonin 5 MG TABS Take 2-3 tablets by mouth at bedtime.   . Multiple Vitamins-Iron (MULTIVITAMINS WITH IRON) TABS Take 1 tablet by mouth daily.   [name] nystatin ointment (MYCOSTATIN) Apply 1 application topically 2 (two) times daily as needed (IRRITATION).   [name] omeprazole (PRILOSEC) 20 MG capsule Take 1 capsule (20 mg total) by mouth 2 (two) times daily before a meal.  . ONETOUCH DELICA LANCETS FINE MISC 1 Units by Does not apply route as directed.  [name] PARoxetine (PAXIL) 10 MG tablet TAKE 2 AND 1/2 TABS DAILY (Patient taking differently: Take 25 mg by mouth daily. )  . potassium chloride (MICRO-K) 10 MEQ CR capsule Take 2 capsules (20 mEq total) by mouth daily.  [name] rOPINIRole (REQUIP XL) 2 MG 24 hr tablet TAKE ONE TABLET AT BEDTIME.  [name] rOPINIRole (REQUIP) 0.5 MG tablet TAKE 1 TO 2 TABLETS AT MIDNIGHT.  [name] senna (SENOKOT) 8.6 MG TABS Take 1 tablet by mouth at bedtime.   . sitaGLIPtin-metformin (JANUMET)  50-1000 MG tablet TAKE 1/2 TABLET IN THE MORNING WITH BREAKFAST AND 1 TABLET IN THE EVENING WITH SUPPER.  . Tiotropium Bromide-Olodaterol (STIOLTO RESPIMAT) 2.5-2.5 MCG/ACT AERS Inhale 2 puffs into the lungs daily.  [name] torsemide (DEMADEX) 20 MG tablet Take 2 tablets (40 mg total) by mouth 2 (two) times daily.  . traZODone (DESYREL) 100 MG tablet Take 0.5-1 tablets (50-100 mg total) by mouth at bedtime.  Alveda Reasons 20 MG TABS tablet TAKE 1 TABLET ONCE DAILY WITH SUPPER.  . [DISCONTINUED] metoprolol succinate (TOPROL-XL) 50 MG 24 hr tablet Take 150 mg by mouth daily. Take with or immediately following a meal, take extra 25 mg at night if needed     Allergies:   Benadryl [diphenhydramine hcl], Clopidogrel bisulfate, and Contrast media [iodinated diagnostic agents]   Social History   Socioeconomic History  . Marital status: Married    Spouse name: Not on file  . Number of children: 2  . Years of education:  master's  . Highest education level: Not on file  Occupational History  . Occupation: Forensic scientist: [employer]  . Financial resource strain: Not on file  . Food insecurity    Worry: Not on file    Inability: Not on file  . Transportation needs    Medical: Not on file    Non-medical: Not on file  Tobacco Use  . Smoking status: Former Smoker    Packs/day: 1.50    Years: 30.00    Pack years: 45.00    Types: Cigarettes    Quit date: 08/13/1993    Years since quitting: 25.8  . Smokeless tobacco: Never Used  Substance and Sexual Activity  . Alcohol use: No  . Drug use: No  . Sexual activity: Not on file  Lifestyle  . Physical activity    Days per week: Not on file    Minutes per session: Not on file  . Stress: Not on file  Relationships  . Social Herbalist on phone: Not on file    Gets together: Not on file    Attends religious service: Not on file    Active member of club or organization: Not on file    Attends meetings of clubs or organizations: Not on file    Relationship status: Not on file  Other Topics Concern  . Not on file  Social History Narrative  . Not on file     Family History: The patient's family history includes Cancer in her brother and sister; Colon cancer (age of onset: 78) in her brother; Colon polyps in her sister; Emphysema (age of onset: 7) in her mother; Gout in her brother; Heart attack (age of onset: 1) in her father; Hyperlipidemia in her brother; Hypertension in her brother; Pneumonia in her maternal grandmother; Ulcers in her mother. There is no history of Diabetes or Stroke.  ROS:   Please see the history of present illness.     All other systems reviewed and are negative.  EKGs/Labs/Other Studies Reviewed:    The following studies were reviewed today: Echocardiogram May 19, 2019  1. Left ventricular ejection fraction, by visual estimation, is 45 to 50%. The left ventricle has  mildly decreased function. Left ventricular septal wall thickness was normal. There is no left ventricular hypertrophy.  2. Left ventricular diastolic Doppler parameters are indeterminate pattern of LV diastolic filling.  3. The LV endocardium is not well seen EF looks to be  about 45-50% with septal dyssynergy. The inferior wall is not seen well but appears to be hypokinetic from base to midsection.  4. Global right ventricle has mildly reduced systolic function.The right ventricular size is normal. No increase in right ventricular wall thickness.  5. Left atrial size was moderately dilated.  6. Right atrial size was moderately dilated.  7. Mild mitral annular calcification.  8. The mitral valve is normal in structure. Trace mitral valve regurgitation.  9. The tricuspid valve is normal in structure. Tricuspid valve regurgitation is mild. 10. The aortic valve is normal in structure. Aortic valve regurgitation is trivial by color flow Doppler. Mild aortic valve sclerosis without stenosis. 11. The pulmonic valve was grossly normal. Pulmonic valve regurgitation is trivial by color flow Doppler. 12. The inferior vena cava is dilated in size with >50% respiratory variability, suggesting right atrial pressure of 8 mmHg  EKG:  EKG is not ordered today.  The ekg ordered 04/27/2019 demonstrates atrial fibrillation, poor R wave progression  Recent Labs: 10/02/2018: TSH 2.21 01/16/2019: Magnesium 2.1 02/16/2019: ALT 14; Pro B Natriuretic peptide (BNP) 443.0 04/02/2019: BNP 149.8 06/04/2019: BUN 24; Creatinine, Ser 1.01; Hemoglobin 12.0; Platelets 275; Potassium 4.2; Sodium 131  Recent Lipid Panel    Component Value Date/Time   CHOL 118 10/02/2018 1203   TRIG 123.0 10/02/2018 1203   HDL 51.10 10/02/2018 1203   CHOLHDL 2 10/02/2018 1203   VLDL 24.6 10/02/2018 1203   LDLCALC 42 10/02/2018 1203   LDLDIRECT 143.4 01/23/2008 1316    Physical Exam:    VS:  BP 137/78   Pulse (!) 106   Ht 5\' 2"  (1.575 m)    Wt 179 lb (81.2 kg)   SpO2 99%   BMI 32.74 kg/m     Wt Readings from Last 3 Encounters:  06/04/19 179 lb (81.2 kg)  04/27/19 183 lb 2 oz (83.1 kg)  03/05/19 177 lb (80.3 kg)     GEN:  Well nourished, well developed in no acute distress HEENT: Normal NECK: No JVD; No carotid bruits LYMPHATICS: No lymphadenopathy CARDIAC: irregular, no murmurs, rubs, gallops RESPIRATORY:  Clear to auscultation without rales, wheezing or rhonchi  ABDOMEN: Soft, non-tender, non-distended MUSCULOSKELETAL:  No edema; No deformity  SKIN: Warm and dry NEUROLOGIC:  Alert and oriented x 3 PSYCHIATRIC:  Normal affect   ASSESSMENT:    1. Longstanding persistent atrial fibrillation ([place])   2. Coronary artery disease involving native coronary artery of native heart without angina pectoris   3. Essential hypertension   4. Dyslipidemia   5. Diabetes mellitus type 2 in obese ([place])   6. Anemia, unspecified type    PLAN:    In order of problems listed above:  1. AFib: Rate controlled.  Back on anticoagulation after her episode of tamponade.   2. CAD: Asymptomatic.  Normal systolic function.  No revascularization procedures since 2009; with low risk nuclear stress test 2012 3. HTN: Fair, albeit imperfect control. 4. HLP: Most recent LDL cholesterol 42. 5. DM: Most recent hemoglobin A1c 6.5%.  On SGLT2 inhibitor, Metformin, sitagliptin. 6. Anemia: Almost completely back to normal, still with microcytic indices.  She is still taking a multivitamin with iron.   Medication Adjustments/Labs and Tests Ordered: Current medicines are reviewed at length with the patient today.  Concerns regarding medicines are outlined above.  Orders Placed This Encounter  Procedures  . CBC   No orders of the defined types were placed in this encounter.   Patient Instructions  Medication Instructions:  No changes *If you need a refill on your cardiac medications before your next appointment, please call your pharmacy*   Lab Work: Your provider would like for you to have the following labs today: CBC  If you have labs (blood work) drawn today and your tests are completely normal, you will receive your results only by: [name] MyChart Message (if you have MyChart) OR . A paper copy in the mail If you have any lab test that is abnormal or we need to change your treatment, we will call you to review the results.  Testing/Procedures: None ordered  Follow-Up: At [facility], you and your health needs are our priority.  As part of our continuing mission to provide you with exceptional heart care, we have created designated Provider Care Teams.  These Care Teams include your primary Cardiologist (physician) and Advanced Practice Providers (APPs -  Physician Assistants and Nurse Practitioners) who all work together to provide you with the care you need, when you need it.  Your next appointment:   Follow up in 2 months with Dr. Sallyanne Kuster  Other Instructions Please call the office or send a MyChart message if your heart rate stays above 100 bpm OF:[id number])     Signed, Sanda Klein, MD  06/11/2019 12:29 PM    [place]

## 2019-06-04 NOTE — Telephone Encounter (Signed)
Left message for patient to call and schedule 2 month follow up appointment with Dr. Sallyanne Kuster per 06/04/19 staff message from Kathryne Sharper, RN

## 2019-06-05 DIAGNOSIS — D500 Iron deficiency anemia secondary to blood loss (chronic): Secondary | ICD-10-CM | POA: Diagnosis not present

## 2019-06-05 DIAGNOSIS — R198 Other specified symptoms and signs involving the digestive system and abdomen: Secondary | ICD-10-CM | POA: Diagnosis not present

## 2019-06-05 LAB — CBC
Hematocrit: 36.9 % (ref 34.0–46.6)
Hemoglobin: 12 g/dL (ref 11.1–15.9)
MCH: 24.5 pg — ABNORMAL LOW (ref 26.6–33.0)
MCHC: 32.5 g/dL (ref 31.5–35.7)
MCV: 76 fL — ABNORMAL LOW (ref 79–97)
Platelets: 275 10*3/uL (ref 150–450)
RBC: 4.89 x10E6/uL (ref 3.77–5.28)
RDW: 16.3 % — ABNORMAL HIGH (ref 11.7–15.4)
WBC: 8 10*3/uL (ref 3.4–10.8)

## 2019-06-05 LAB — BASIC METABOLIC PANEL
BUN/Creatinine Ratio: 24 (ref 12–28)
BUN: 24 mg/dL (ref 8–27)
CO2: 26 mmol/L (ref 20–29)
Calcium: 9.4 mg/dL (ref 8.7–10.3)
Chloride: 91 mmol/L — ABNORMAL LOW (ref 96–106)
Creatinine, Ser: 1.01 mg/dL — ABNORMAL HIGH (ref 0.57–1.00)
GFR calc Af Amer: 65 mL/min/{1.73_m2} (ref >59)
GFR calc non Af Amer: 57 mL/min/{1.73_m2} — ABNORMAL LOW (ref >59)
Glucose: 144 mg/dL — ABNORMAL HIGH (ref 65–99)
Potassium: 4.2 mmol/L (ref 3.5–5.2)
Sodium: 131 mmol/L — ABNORMAL LOW (ref 134–144)

## 2019-06-09 ENCOUNTER — Other Ambulatory Visit: Payer: Self-pay | Admitting: Cardiovascular Disease

## 2019-06-09 ENCOUNTER — Other Ambulatory Visit: Payer: Self-pay

## 2019-06-09 MED ORDER — METOPROLOL SUCCINATE ER 50 MG PO TB24: 150.0000 mg | ORAL_TABLET | Freq: Every day | ORAL | 3 refills | Status: DC

## 2019-06-09 NOTE — Progress Notes (Signed)
Refilled Toprol XL per pt request.

## 2019-06-11 ENCOUNTER — Encounter: Payer: Self-pay | Admitting: Cardiovascular Disease

## 2019-06-11 NOTE — Telephone Encounter (Signed)
Patient will call back to schedule 2 mos follow up appointment with Dr. Sallyanne Kuster

## 2019-06-17 ENCOUNTER — Other Ambulatory Visit (HOSPITAL_COMMUNITY): Payer: Self-pay | Admitting: Internal Medicine

## 2019-06-29 ENCOUNTER — Other Ambulatory Visit: Payer: PPO | Admitting: Orthotics

## 2019-07-02 ENCOUNTER — Telehealth: Payer: Self-pay | Admitting: Cardiovascular Disease

## 2019-07-02 ENCOUNTER — Encounter: Payer: Self-pay | Admitting: Neurology

## 2019-07-02 NOTE — Telephone Encounter (Signed)
Pt called to report that she has been having increased HR with her AFIB for several days..  101/112/115/125/125/ and 133 today.   She has felt "wobbly" and tired and nauseated. No chest pain, no dizziness, some Sob with exertion.   Pt says she is taking all of her meds but started when she had one missed dose if her metoprolol but she took it later that night when she remembered.   She does not feel bad enough to consider going to the ED... will forward to Dr. Sallyanne Kuster for advice.

## 2019-07-02 NOTE — Telephone Encounter (Signed)
Patient c/o Palpitations:  High priority if patient c/o lightheadedness, shortness of breath, or chest pain  1) How long have you had palpitations/irregular HR/ Afib? Are you having the symptoms now? Afib  2) Are you currently experiencing lightheadedness, SOB or CP? Yes, feels all of these symptoms but she did just take her medicine and doesn't know if it has had time to kick in yet.  3) Do you have a history of afib (atrial fibrillation) or irregular heart rhythm? yes  4) Have you checked your BP or HR? (document readings if available):   Within the last week: 101, 112, 115, 125, 125,   137/78 HR 133 just now  5) Are you experiencing any other symptoms? Just does not feel well at all, states she feels wobbly  Tried calling triage 3 times but no answer

## 2019-07-02 NOTE — Telephone Encounter (Signed)
Called pt back and advised her that if she starts to feel bad or anything worsens she may call after hours MD or call EMS... she says she is fine at this time but will call if anything changes prior to morning.

## 2019-07-03 ENCOUNTER — Other Ambulatory Visit: Payer: Self-pay

## 2019-07-03 ENCOUNTER — Ambulatory Visit: Payer: PPO

## 2019-07-03 NOTE — Telephone Encounter (Signed)
LM2CB 

## 2019-07-03 NOTE — Telephone Encounter (Signed)
Please confirm her current doses of diltiazem and metoprolol.  Also ask what her blood pressure is. As long as her heart rate is over 100 and blood pressure is 110 or higher, I would recommend taking an extra 50 mg of metoprolol succinate (for a total of 200 mg daily). Unfortunately, missing a dose of metoprolol can cause what we call "rebound" arrhythmia, similar to a surge of adrenaline.  Hopefully things will settle down over the weekend.  If she has shortness of breath, chest tightness or feels like she is losing consciousness, she should seek emergency help.

## 2019-07-03 NOTE — Progress Notes (Unsigned)
Subjective:   [name] is a 68 y.o. female who presents for Medicare Annual (Subsequent) preventive examination. I connected with patient by a telephone and verified that I am speaking with the correct person using two identifiers. Patient stated full name and DOB. Patient gave permission to continue with telephonic visit. Patient's location was at home and Nurse's location was at [place] office. Participants during this visit included patient and nurse.  Review of Systems:     Sleep patterns: {SX; SLEEP PATTERNS:18802::"feels rested on waking","does not get up to void","gets up *** times nightly to void","sleeps *** hours nightly"}.    Home Safety/Smoke Alarms: Feels safe in home. Smoke alarms in place.  Living environment; residence and Firearm Safety: {Rehab home environment / accessibility:30080::"no firearms","firearms stored safely"}. Seat Belt Safety/Bike Helmet: Wears seat belt.     Objective:     Vitals: There were no vitals taken for this visit.  There is no height or weight on file to calculate BMI.  Advanced Directives 01/11/2019 12/30/2018 12/30/2018 12/29/2018 01/23/2018 01/03/2018 06/22/2016  Does Patient Have a Medical Advance Directive? Yes Yes Yes Yes No No;Yes Yes  Type of Advance Directive Living will [place];Living will - - Living will Living will [place];Living will  Does patient want to make changes to medical advance directive? No - Patient declined No - Patient declined No - Patient declined - - - -  Copy of [place] in Chart? No - copy requested No - copy requested - - - - Yes  Would patient like information on creating a medical advance directive? - - - - - No - Patient declined -  Pre-existing out of facility DNR order (yellow form or pink MOST form) - - - - - - -    Tobacco Social History   Tobacco Use  Smoking Status Former Smoker  . Packs/day: 1.50  . Years: 30.00  . Pack years: 45.00   . Types: Cigarettes  . Quit date: 08/13/1993  . Years since quitting: 25.9  Smokeless Tobacco Never Used     Counseling given: Not Answered   Past Medical History:  Diagnosis Date  . Anemia, unspecified   . Arthritis   . Asthma   . Atrial fibrillation with RVR ([place])    a. on Xarelto  . Bell's palsy    Facial nerve decompression in 2001  . CHF (congestive heart failure) ([place])   . Chronic low back pain   . COPD with asthma ([place])   . Coronary artery disease    Myoview 04/12/11 was entirely normal. ECHO 02/26/08 showed only minor abnormalities. Stenting 05/26/08 of her posterolateral branch to the left circumflex coronary artery. Used a 2.5x70mm Taxus Monorail stent.myoview 2014 was without ischemia  . Diabetes mellitus    Type 2  . Early cataracts, bilateral   . Fatty liver   . GERD (gastroesophageal reflux disease)   . Glaucoma   . Glaucoma   . Goiter   . Heart murmur   . History of nuclear stress test 2012; 2014   lexiscan; normal pattern of perfusion; normal, low risk scan   . Hyperlipidemia   . Hypertension   . Panic disorder   . Pneumonia 2008  . Polycystic ovary disease    Hysterectomy in 1982 for this  . Shortness of breath dyspnea    ECHO 02/26/08 showed only minor abnormalities  . Spinal stenosis    Past Surgical History:  Procedure Laterality Date  .  ABDOMINAL HYSTERECTOMY  1982   & BSO; for polycystic ovary disease  . CARDIOVERSION N/A 12/17/2013   Procedure: CARDIOVERSION;  Surgeon: Pixie Casino, MD;  Location: [facility] ENDOSCOPY;  Service: Cardiovascular;  Laterality: N/A;  . CARDIOVERSION N/A 09/30/2015   Procedure: CARDIOVERSION;  Surgeon: Dorothy Spark, MD;  Location: [facility] ENDOSCOPY;  Service: Cardiovascular;  Laterality: N/A;  . CARDIOVERSION N/A 06/22/2016   Procedure: CARDIOVERSION;  Surgeon: Skeet Latch, MD;  Location: [facility] ENDOSCOPY;  Service: Cardiovascular;  Laterality: N/A;  . CENTRAL LINE INSERTION  01/13/2019   Procedure: CENTRAL LINE INSERTION;   Surgeon: Jolaine Artist, MD;  Location: [place] CV LAB;  Service: Cardiovascular;;  . COLONOSCOPY     last 2009; Dr Cristina Gong; due 2019  . COLONOSCOPY WITH PROPOFOL N/A 01/23/2018   Procedure: COLONOSCOPY WITH PROPOFOL;  Surgeon: Ronald Lobo, MD;  Location: [place];  Service: Endoscopy;  Laterality: N/A;  . CORONARY ANGIOPLASTY  05/26/2008   Stenting of her posterolateral branch to the left circumflex coronary artery. Used a 2.5x60mm Taxus Monorail stent.  . ESOPHAGOGASTRODUODENOSCOPY (EGD) WITH PROPOFOL N/A 01/23/2018   Procedure: ESOPHAGOGASTRODUODENOSCOPY (EGD) WITH PROPOFOL;  Surgeon: Ronald Lobo, MD;  Location: [place];  Service: Endoscopy;  Laterality: N/A;  . FACIAL NERVE DECOMPRESSION  2001/2002   bells palsy   . LAPAROSCOPIC CHOLECYSTECTOMY  06/15/2011    Dr Dalbert Batman  . PERICARDIOCENTESIS N/A 01/13/2019   Procedure: PERICARDIOCENTESIS;  Surgeon: Burnell Blanks, MD;  Location: [place] CV LAB;  Service: Cardiovascular;  Laterality: N/A;  . RIGHT AND LEFT HEART CATH N/A 01/13/2019   Procedure: RIGHT AND LEFT HEART CATH;  Surgeon: Jolaine Artist, MD;  Location: [place] CV LAB;  Service: Cardiovascular;  Laterality: N/A;  . TEE WITHOUT CARDIOVERSION N/A 12/17/2013   Procedure: TRANSESOPHAGEAL ECHOCARDIOGRAM (TEE);  Surgeon: Pixie Casino, MD;  Location: [facility] ENDOSCOPY;  Service: Cardiovascular;  Laterality: N/A;  trish/ja  . TRANSTHORACIC ECHOCARDIOGRAM  123456   LV systolic function normal with mild conc LVH; LA mildly dilated; trace MR/TR  . UPPER GI ENDOSCOPY  2009   negative   Family History  Problem Relation Age of Onset  . Heart attack Father 38       2nd MI at 104  . Colon cancer Brother 17  . Gout Brother   . Ulcers Mother   . Emphysema Mother 33  . Colon polyps Sister   . Cancer Sister        Basal cell carcinoma  . Pneumonia Maternal Grandmother   . Hypertension Brother   . Hyperlipidemia Brother   . Cancer Brother         Skin  . Diabetes Neg Hx   . Stroke Neg Hx    Social History   Socioeconomic History  . Marital status: Married    Spouse name: Not on file  . Number of children: 2  . Years of education: master's  . Highest education level: Not on file  Occupational History  . Occupation: Forensic scientist: [employer]  . Financial resource strain: Not on file  . Food insecurity    Worry: Not on file    Inability: Not on file  . Transportation needs    Medical: Not on file    Non-medical: Not on file  Tobacco Use  . Smoking status: Former Smoker    Packs/day: 1.50    Years: 30.00    Pack years: 45.00    Types: Cigarettes  Quit date: 08/13/1993    Years since quitting: 25.9  . Smokeless tobacco: Never Used  Substance and Sexual Activity  . Alcohol use: No  . Drug use: No  . Sexual activity: Not on file  Lifestyle  . Physical activity    Days per week: Not on file    Minutes per session: Not on file  . Stress: Not on file  Relationships  . Social Herbalist on phone: Not on file    Gets together: Not on file    Attends religious service: Not on file    Active member of club or organization: Not on file    Attends meetings of clubs or organizations: Not on file    Relationship status: Not on file  Other Topics Concern  . Not on file  Social History Narrative  . Not on file    Outpatient Encounter Medications as of 07/03/2019  Medication Sig  . ascorbic acid (VITAMIN C) 1000 MG tablet Take 1,000 mg by mouth 2 (two) times daily.   [name] CALCIUM-MAG-VIT C-VIT D PO Take 1 tablet by mouth daily.   . celecoxib (CELEBREX) 200 MG capsule   . Continuous Blood Gluc Sensor (FREESTYLE LIBRE SENSOR SYSTEM) MISC Use to monitor sugars  . diltiazem (CARDIZEM CD) 180 MG 24 hr capsule TAKE 1 CAPSULE DAILY.  [name] gabapentin (NEURONTIN) 100 MG capsule TAKE (1) CAPSULE THREE TIMES DAILY. (Patient taking differently: Take 100 mg by mouth 3 (three) times  daily. )  . guaiFENesin (MUCINEX) 600 MG 12 hr tablet Take 600 mg by mouth 2 (two) times daily as needed for cough.   [name] HYDROcodone-acetaminophen (NORCO/VICODIN) 5-325 MG tablet TAKE ONE TABLET AT BEDTIME.  [name] INVOKANA 100 MG TABS tablet TAKE 1 TABLET ONCE DAILY.  [name] latanoprost (XALATAN) 0.005 % ophthalmic solution Place 1 drop into both eyes at bedtime.  [name] LORazepam (ATIVAN) 0.5 MG tablet TAKE 1 OR 2 TABLETS AT BEDTIME.  . Melatonin 5 MG TABS Take 2-3 tablets by mouth at bedtime.   . metoprolol succinate (TOPROL-XL) 50 MG 24 hr tablet TAKE 3 TABLETS ONCE DAILY WITH FOOD.  . Multiple Vitamins-Iron (MULTIVITAMINS WITH IRON) TABS Take 1 tablet by mouth daily.   [name] nystatin ointment (MYCOSTATIN) Apply 1 application topically 2 (two) times daily as needed (IRRITATION).   [name] omeprazole (PRILOSEC) 20 MG capsule Take 1 capsule (20 mg total) by mouth 2 (two) times daily before a meal.  . ONETOUCH DELICA LANCETS FINE MISC 1 Units by Does not apply route as directed.  [name] PARoxetine (PAXIL) 10 MG tablet TAKE 2 AND 1/2 TABS DAILY (Patient taking differently: Take 25 mg by mouth daily. )  . potassium chloride (MICRO-K) 10 MEQ CR capsule TAKE (2) CAPSULES DAILY.  [name] rOPINIRole (REQUIP XL) 2 MG 24 hr tablet TAKE ONE TABLET AT BEDTIME.  [name] rOPINIRole (REQUIP) 0.5 MG tablet TAKE 1 TO 2 TABLETS AT MIDNIGHT.  [name] senna (SENOKOT) 8.6 MG TABS Take 1 tablet by mouth at bedtime.   . sitaGLIPtin-metformin (JANUMET) 50-1000 MG tablet TAKE 1/2 TABLET IN THE MORNING WITH BREAKFAST AND 1 TABLET IN THE EVENING WITH SUPPER.  . Tiotropium Bromide-Olodaterol (STIOLTO RESPIMAT) 2.5-2.5 MCG/ACT AERS Inhale 2 puffs into the lungs daily.  [name] torsemide (DEMADEX) 20 MG tablet Take 2 tablets (40 mg total) by mouth 2 (two) times daily.  . traZODone (DESYREL) 100 MG tablet Take 0.5-1 tablets (50-100 mg total) by mouth at bedtime.  Alveda Reasons 20 MG TABS tablet TAKE 1  TABLET ONCE DAILY WITH SUPPER.   No facility-administered encounter  medications on file as of 07/03/2019.     Activities of Daily Living In your present state of health, do you have any difficulty performing the following activities: 01/12/2019 12/30/2018  Hearing? N N  Vision? N N  Difficulty concentrating or making decisions? N N  Walking or climbing stairs? N N  Dressing or bathing? N N  Doing errands, shopping? N N  Some recent data might be hidden    Patient Care Team: Hoyt Koch, MD as PCP - General (Internal Medicine) Lorretta Harp, MD as PCP - Cardiology (Cardiology)    Assessment:   This is a routine wellness examination for [facility]. Physical assessment deferred to PCP.   Exercise Activities and Dietary recommendations   Diet (meal preparation, eat out, water intake, caffeinated beverages, dairy products, fruits and vegetables): {Desc; diets:16563}   Goals   None     Fall Risk Fall Risk  02/16/2019 01/22/2018 07/07/2016 07/02/2016 01/02/2016  Falls in the past year? 1 No No No No  Number falls in past yr: 1 - - - -  Injury with Fall? 0 - - - -   Is the patient's home free of loose throw rugs in walkways, pet beds, electrical cords, etc?   {Blank single:19197::"yes","no"}      Grab bars in the bathroom? {Blank single:19197::"yes","no"}      Handrails on the stairs?   {Blank single:19197::"yes","no"}      Adequate lighting?   {Blank single:19197::"yes","no"}  Depression Screen PHQ 2/9 Scores 02/16/2019 02/14/2018 04/23/2017 07/07/2016  PHQ - 2 Score 2 0 1 0  PHQ- 9 Score 9 5 3  -     Cognitive Function   Montreal Cognitive Assessment  03/05/2019  Visuospatial/ Executive (0/5) 4  Naming (0/3) 3  Attention: Read list of digits (0/2) 2  Attention: Read list of letters (0/1) 1  Attention: Serial 7 subtraction starting at 100 (0/3) 3  Language: Repeat phrase (0/2) 2  Language : Fluency (0/1) 1  Abstraction (0/2) 2  Delayed Recall (0/5) 5  Orientation (0/6) 6  Total 29      Immunization History  Administered Date(s)  Administered  . Influenza Whole 05/18/2009, 05/12/2010  . Influenza, High Dose Seasonal PF 07/30/2016, 04/23/2017, 06/09/2018  . Influenza,inj,Quad PF,6+ Mos 08/03/2015  . Influenza,inj,quad, With Preservative 05/14/2015  . Pneumococcal Conjugate-13 06/09/2018   Screening Tests Health Maintenance  Topic Date Due  . TETANUS/TDAP  10/22/1967  . OPHTHALMOLOGY EXAM  02/15/2018  . MAMMOGRAM  01/15/2019  . INFLUENZA VACCINE  03/14/2019  . FOOT EXAM  06/10/2019  . URINE MICROALBUMIN  06/10/2019  . PNA vac Low Risk Adult (2 of 2 - PPSV23) 06/10/2019  . HEMOGLOBIN A1C  08/19/2019  . COLONOSCOPY  01/24/2028  . Hepatitis C Screening  Completed  . DEXA SCAN  Addressed      Plan:      I have personally reviewed and noted the following in the patient's chart:   . Medical and social history . Use of alcohol, tobacco or illicit drugs  . Current medications and supplements . Functional ability and status . Nutritional status . Physical activity . Advanced directives . List of other physicians . Screenings to include cognitive, depression, and falls . Referrals and appointments  In addition, I have reviewed and discussed with patient certain preventive protocols, quality metrics, and best practice recommendations. A written personalized care plan for preventive services as well as general preventive health recommendations  were provided to patient.     Michiel Cowboy, RN  07/03/2019

## 2019-07-06 ENCOUNTER — Other Ambulatory Visit: Payer: Self-pay

## 2019-07-06 ENCOUNTER — Encounter: Payer: Self-pay | Admitting: Internal Medicine

## 2019-07-06 ENCOUNTER — Ambulatory Visit (INDEPENDENT_AMBULATORY_CARE_PROVIDER_SITE_OTHER): Payer: PPO | Admitting: Internal Medicine

## 2019-07-06 VITALS — BP 108/70 | HR 85 | Temp 98.3°F | Ht 62.0 in | Wt 183.0 lb

## 2019-07-06 DIAGNOSIS — M545 Low back pain, unspecified: Secondary | ICD-10-CM

## 2019-07-06 DIAGNOSIS — G8929 Other chronic pain: Secondary | ICD-10-CM | POA: Diagnosis not present

## 2019-07-06 DIAGNOSIS — M1990 Unspecified osteoarthritis, unspecified site: Secondary | ICD-10-CM | POA: Diagnosis not present

## 2019-07-06 DIAGNOSIS — R0602 Shortness of breath: Secondary | ICD-10-CM

## 2019-07-06 DIAGNOSIS — Z23 Encounter for immunization: Secondary | ICD-10-CM

## 2019-07-06 NOTE — Telephone Encounter (Signed)
Dear Barbera Setters, Unexpectantly, I have to take care of some health problems with family members and will not be able to to invest- It was a tempting location, for sure .   Stay well , Arta Bruce.

## 2019-07-06 NOTE — Telephone Encounter (Signed)
Left message for pt to call.

## 2019-07-06 NOTE — Patient Instructions (Signed)
We will get the therapy out to the heart.

## 2019-07-06 NOTE — Progress Notes (Signed)
   Subjective:   Patient ID: [name], female    DOB: 1950-09-15, 68 y.o.   MRN: [id number]  HPI The patient is a 68 YO female coming in for concerns about weakness and pain. She is needing PT and had this previously ordered but due to covid-19 she did not complete. They need a new order to resume this at home. Then she will likely need some outpatient PT once the home health is done. She has severe SOB and cannot leave home without assistance.   Review of Systems  Constitutional: Positive for fatigue.  HENT: Negative.   Eyes: Negative.   Respiratory: Positive for shortness of breath. Negative for cough and chest tightness.   Cardiovascular: Negative for chest pain, palpitations and leg swelling.  Gastrointestinal: Negative for abdominal distention, abdominal pain, constipation, diarrhea, nausea and vomiting.  Musculoskeletal: Positive for arthralgias and back pain.  Skin: Negative.   Neurological: Negative.   Psychiatric/Behavioral: Negative.     Objective:  Physical Exam Constitutional:      Appearance: She is well-developed. She is obese.  HENT:     Head: Normocephalic and atraumatic.  Neck:     Musculoskeletal: Normal range of motion.  Cardiovascular:     Rate and Rhythm: Normal rate and regular rhythm.  Pulmonary:     Effort: Pulmonary effort is normal. No respiratory distress.     Breath sounds: Normal breath sounds. No wheezing or rales.     Comments: Fatigue with exertion Abdominal:     General: Bowel sounds are normal. There is no distension.     Palpations: Abdomen is soft.     Tenderness: There is no abdominal tenderness. There is no rebound.  Skin:    General: Skin is warm and dry.  Neurological:     Mental Status: She is alert and oriented to person, place, and time.     Coordination: Coordination normal.     Vitals:   07/06/19 1430  BP: 108/70  Pulse: 85  Temp: 98.3 F (36.8 C)  TempSrc: Oral  SpO2: 96%  Weight: 183 lb (83 kg)  Height: 5\' 2"   (1.575 m)    This visit occurred during the SARS-CoV-2 public health emergency.  Safety protocols were in place, including screening questions prior to the visit, additional usage of staff PPE, and extensive cleaning of exam room while observing appropriate contact time as indicated for disinfecting solutions.   Assessment & Plan:  Flu shot given at visit

## 2019-07-07 ENCOUNTER — Other Ambulatory Visit: Payer: Self-pay | Admitting: Internal Medicine

## 2019-07-07 NOTE — Assessment & Plan Note (Signed)
Stable, CBC looks improved on recent draw.

## 2019-07-07 NOTE — Telephone Encounter (Signed)
Patient returning call in regards to her medication. Patient also states she has forgot to take her metoprolol succinate (TOPROL-XL) 50 MG 24 hr tablet the day that her HR was increased. States that since she has taking them she feels a lot better.

## 2019-07-07 NOTE — Telephone Encounter (Signed)
Control database checked last refill: 08/28/202030 tabs LOV:07/06/2019 NOV: none

## 2019-07-07 NOTE — Assessment & Plan Note (Signed)
Needs PT through home health to decrease pain and increase endurance. Takes hydrocodone sparingly to help with pain.

## 2019-07-07 NOTE — Telephone Encounter (Signed)
The patient called back and stated that she had not been taking her Metoprolol. She had placed it in a different place other than with her other medications.  She has confirmed that she is taking the Metoprolol 150 mg daily and Diltiazem 180 mg once daily.  Since she has been taking the Metoprolol she has been feeling much better. She will continue to check her blood pressure and call back if anything further is needed.

## 2019-07-08 ENCOUNTER — Other Ambulatory Visit: Payer: Self-pay

## 2019-07-08 ENCOUNTER — Ambulatory Visit (INDEPENDENT_AMBULATORY_CARE_PROVIDER_SITE_OTHER): Payer: PPO | Admitting: Orthotics

## 2019-07-08 DIAGNOSIS — M25572 Pain in left ankle and joints of left foot: Secondary | ICD-10-CM | POA: Diagnosis not present

## 2019-07-08 DIAGNOSIS — Q6651 Congenital pes planus, right foot: Secondary | ICD-10-CM

## 2019-07-08 DIAGNOSIS — E1142 Type 2 diabetes mellitus with diabetic polyneuropathy: Secondary | ICD-10-CM | POA: Diagnosis not present

## 2019-07-08 DIAGNOSIS — Q6652 Congenital pes planus, left foot: Secondary | ICD-10-CM | POA: Diagnosis not present

## 2019-07-08 DIAGNOSIS — M2041 Other hammer toe(s) (acquired), right foot: Secondary | ICD-10-CM | POA: Diagnosis not present

## 2019-07-08 DIAGNOSIS — E1140 Type 2 diabetes mellitus with diabetic neuropathy, unspecified: Secondary | ICD-10-CM

## 2019-07-08 DIAGNOSIS — M2142 Flat foot [pes planus] (acquired), left foot: Secondary | ICD-10-CM | POA: Diagnosis not present

## 2019-07-08 DIAGNOSIS — Q6650 Congenital pes planus, unspecified foot: Secondary | ICD-10-CM

## 2019-07-08 DIAGNOSIS — M2141 Flat foot [pes planus] (acquired), right foot: Secondary | ICD-10-CM

## 2019-07-13 NOTE — Progress Notes (Signed)

## 2019-07-15 ENCOUNTER — Ambulatory Visit: Payer: PPO | Admitting: Podiatry

## 2019-07-15 IMAGING — DX PORTABLE CHEST - 1 VIEW
1 series · 1 of 1 positions shown · non-contrast
Comparison: 12/29/2018

CLINICAL DATA: Stable enlarged cardiac silhouette.

EXAM:
PORTABLE CHEST 1 VIEW

[chest ap]
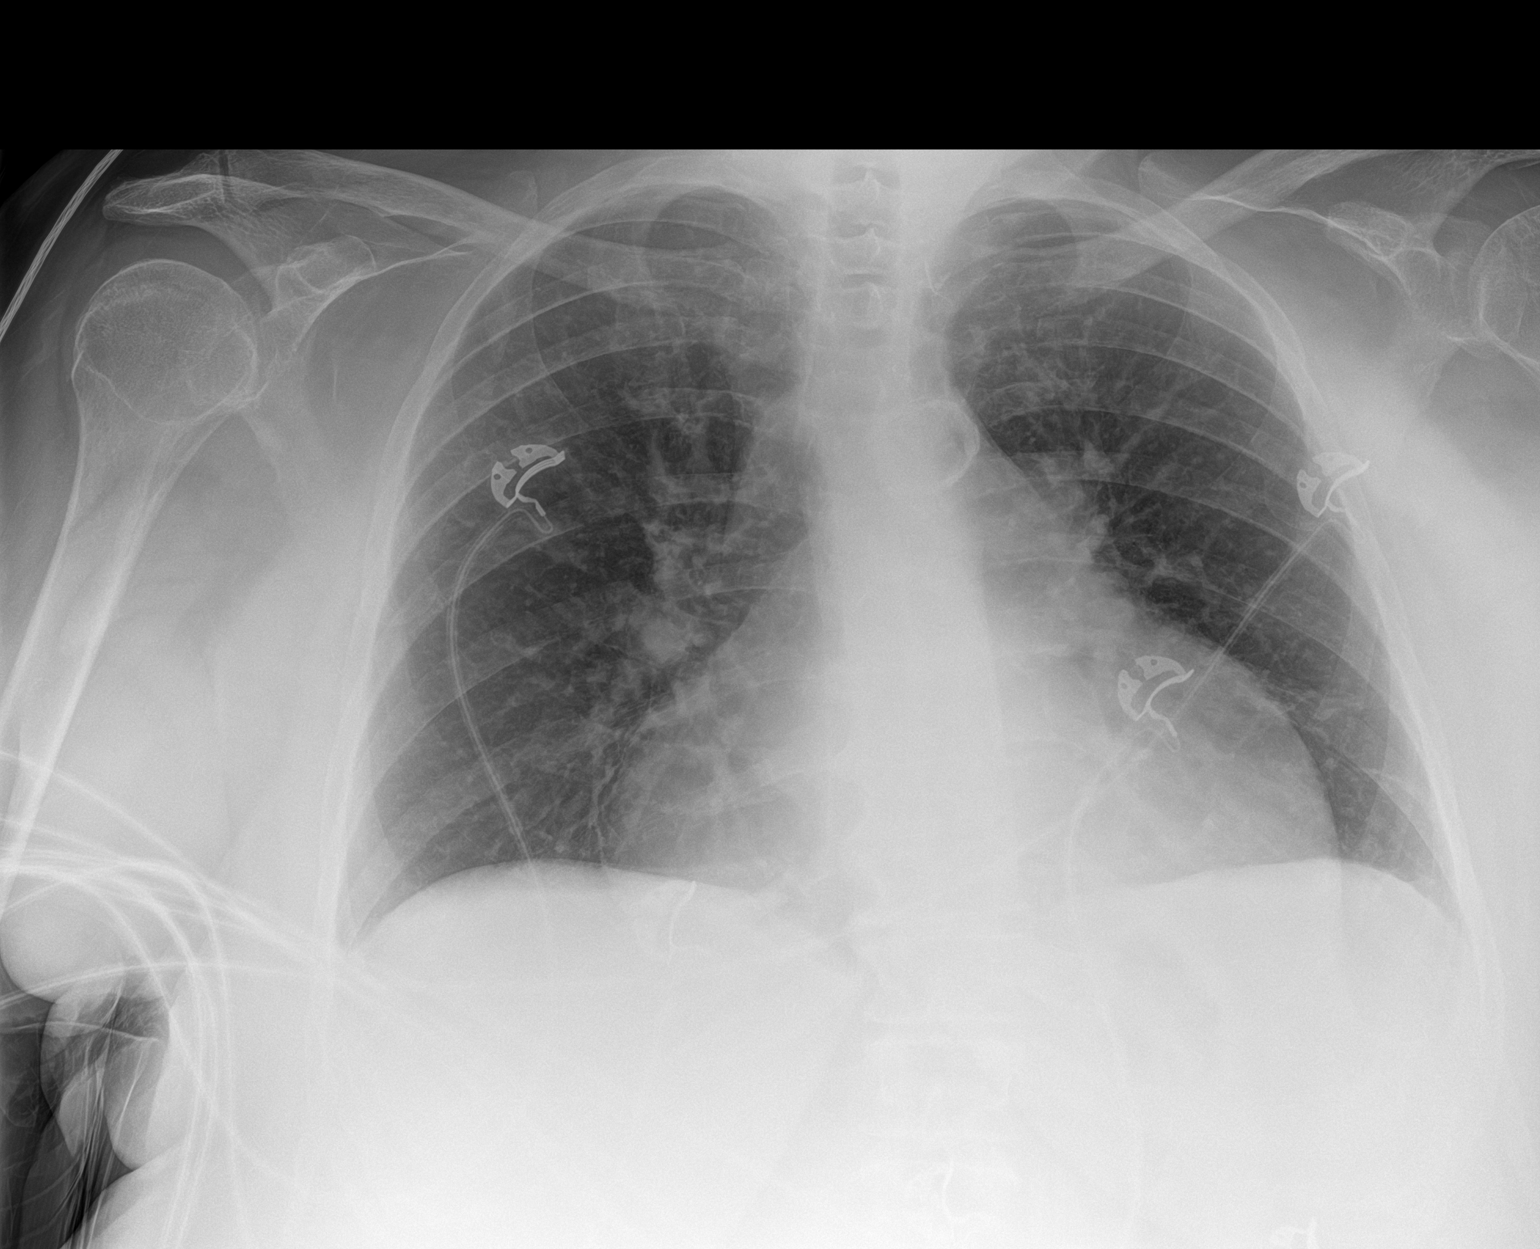

[1 of 1 positions shown; findings below may reference images not displayed]

FINDINGS: Cardiac silhouette is enlarged compared to prior. No effusion,
infiltrate or pneumothorax. No acute osseous abnormality.
IMPRESSION: Enlarged cardiac silhouette representing either new cardiomegaly or
pericardial effusion.

## 2019-07-20 ENCOUNTER — Encounter: Payer: Self-pay | Admitting: Internal Medicine

## 2019-07-23 ENCOUNTER — Other Ambulatory Visit: Payer: Self-pay

## 2019-07-23 ENCOUNTER — Ambulatory Visit: Payer: PPO | Admitting: Podiatry

## 2019-07-23 ENCOUNTER — Encounter: Payer: Self-pay | Admitting: Podiatry

## 2019-07-23 DIAGNOSIS — M778 Other enthesopathies, not elsewhere classified: Secondary | ICD-10-CM

## 2019-07-24 NOTE — Progress Notes (Signed)
Subjective:   Patient ID: [name], female   DOB: 68 y.o.   MRN: [id number]   HPI Patient states that she is getting pain on top of the feet left over right and had about 4 months relief   ROS      Objective:  Physical Exam  Neurovascular status intact with extensor tendinitis bilateral left over right     Assessment:  Acute extensor tendinitis bilateral     Plan:  Sterile prep injected the tendon complex bilateral 3 mg Dexasone Kenalog 5 mg Xylocaine advised on diclofenac usage continue orthotic usage and will be seen back as needed

## 2019-07-30 ENCOUNTER — Other Ambulatory Visit: Payer: Self-pay | Admitting: Internal Medicine

## 2019-08-04 DIAGNOSIS — M545 Low back pain: Secondary | ICD-10-CM | POA: Diagnosis not present

## 2019-08-04 DIAGNOSIS — M5442 Lumbago with sciatica, left side: Secondary | ICD-10-CM | POA: Diagnosis not present

## 2019-08-05 ENCOUNTER — Other Ambulatory Visit: Payer: Self-pay

## 2019-08-05 ENCOUNTER — Encounter: Payer: Self-pay | Admitting: Cardiovascular Disease

## 2019-08-05 ENCOUNTER — Ambulatory Visit (INDEPENDENT_AMBULATORY_CARE_PROVIDER_SITE_OTHER): Payer: PPO | Admitting: Cardiovascular Disease

## 2019-08-05 VITALS — BP 134/66 | HR 83 | Ht 62.0 in | Wt 183.0 lb

## 2019-08-05 DIAGNOSIS — E1169 Type 2 diabetes mellitus with other specified complication: Secondary | ICD-10-CM

## 2019-08-05 DIAGNOSIS — I10 Essential (primary) hypertension: Secondary | ICD-10-CM | POA: Diagnosis not present

## 2019-08-05 DIAGNOSIS — E669 Obesity, unspecified: Secondary | ICD-10-CM

## 2019-08-05 DIAGNOSIS — Z7901 Long term (current) use of anticoagulants: Secondary | ICD-10-CM | POA: Diagnosis not present

## 2019-08-05 DIAGNOSIS — D500 Iron deficiency anemia secondary to blood loss (chronic): Secondary | ICD-10-CM | POA: Diagnosis not present

## 2019-08-05 DIAGNOSIS — E785 Hyperlipidemia, unspecified: Secondary | ICD-10-CM | POA: Diagnosis not present

## 2019-08-05 DIAGNOSIS — I4811 Longstanding persistent atrial fibrillation: Secondary | ICD-10-CM

## 2019-08-05 DIAGNOSIS — I2510 Atherosclerotic heart disease of native coronary artery without angina pectoris: Secondary | ICD-10-CM | POA: Diagnosis not present

## 2019-08-05 DIAGNOSIS — I5022 Chronic systolic (congestive) heart failure: Secondary | ICD-10-CM | POA: Diagnosis not present

## 2019-08-05 MED ORDER — METOPROLOL SUCCINATE ER 100 MG PO TB24: 100.0000 mg | ORAL_TABLET | Freq: Two times a day (BID) | ORAL | 1 refills | Status: DC

## 2019-08-05 NOTE — Progress Notes (Signed)
Cardiology Office Note:    Date:  08/08/2019   ID:  [name], DOB [birth year], MRN [id number]  PCP:  Hoyt Koch, MD  Cardiologist:  New (CHF clinic, [place]) Electrophysiologist:  None   Referring MD: Hoyt Koch, *   Chief Complaint  Patient presents with  . Follow-up    Atrial fibrillation    History of Present Illness:    [name] is a 68 y.o. female with a hx of CAD (posterior lateral ventricular branch stent in 2009), longstanding persistent atrial fibrillation, 3, diabetes mellitus type 2, hypertension, recent pericardiocentesis for hemothorax with tamponade in June 2020, while on treatment with oral anticoagulant.She is a retired Electrical engineer.  For the most part, she remains unaware of the arrhythmia and feels well.  She presents today with a good ventricular rate control.  She does report that at times she feels her heart rate accelerating and this is associated with nausea, headache and dyspnea.  She has to rest.  After a few minutes she returns to baseline.  The patient specifically denies any chest pain at or with exertion, orthopnea, paroxysmal nocturnal dyspnea, syncope, palpitations, focal neurological deficits, intermittent claudication, lower extremity edema, unexplained weight gain, cough, hemoptysis or wheezing.  She has not had any falls, injuries or bleeding problems.  Past Medical History:  Diagnosis Date  . Anemia, unspecified   . Arthritis   . Asthma   . Atrial fibrillation with RVR ([place])    a. on Xarelto  . Bell's palsy    Facial nerve decompression in 2001  . CHF (congestive heart failure) ([place])   . Chronic low back pain   . COPD with asthma ([place])   . Coronary artery disease    Myoview 04/12/11 was entirely normal. ECHO 02/26/08 showed only minor abnormalities. Stenting 05/26/08 of her posterolateral branch to the left circumflex coronary artery. Used a 2.5x36mm Taxus Monorail stent.myoview 2014 was without ischemia   . Diabetes mellitus    Type 2  . Early cataracts, bilateral   . Fatty liver   . GERD (gastroesophageal reflux disease)   . Glaucoma   . Glaucoma   . Goiter   . Heart murmur   . History of nuclear stress test 2012; 2014   lexiscan; normal pattern of perfusion; normal, low risk scan   . Hyperlipidemia   . Hypertension   . Panic disorder   . Pneumonia 2008  . Polycystic ovary disease    Hysterectomy in 1982 for this  . Shortness of breath dyspnea    ECHO 02/26/08 showed only minor abnormalities  . Spinal stenosis     Past Surgical History:  Procedure Laterality Date  . ABDOMINAL HYSTERECTOMY  1982   & BSO; for polycystic ovary disease  . CARDIOVERSION N/A 12/17/2013   Procedure: CARDIOVERSION;  Surgeon: Pixie Casino, MD;  Location: [facility] ENDOSCOPY;  Service: Cardiovascular;  Laterality: N/A;  . CARDIOVERSION N/A 09/30/2015   Procedure: CARDIOVERSION;  Surgeon: Dorothy Spark, MD;  Location: [facility] ENDOSCOPY;  Service: Cardiovascular;  Laterality: N/A;  . CARDIOVERSION N/A 06/22/2016   Procedure: CARDIOVERSION;  Surgeon: Skeet Latch, MD;  Location: [facility] ENDOSCOPY;  Service: Cardiovascular;  Laterality: N/A;  . CENTRAL LINE INSERTION  01/13/2019   Procedure: CENTRAL LINE INSERTION;  Surgeon: Jolaine Artist, MD;  Location: [facility] CV LAB;  Service: Cardiovascular;;  . COLONOSCOPY     last 2009; Dr Cristina Gong; due 2019  . COLONOSCOPY WITH PROPOFOL N/A 01/23/2018   Procedure: COLONOSCOPY WITH  PROPOFOL;  Surgeon: Ronald Lobo, MD;  Location: [facility];  Service: Endoscopy;  Laterality: N/A;  . CORONARY ANGIOPLASTY  05/26/2008   Stenting of her posterolateral branch to the left circumflex coronary artery. Used a 2.5x60mm Taxus Monorail stent.  . ESOPHAGOGASTRODUODENOSCOPY (EGD) WITH PROPOFOL N/A 01/23/2018   Procedure: ESOPHAGOGASTRODUODENOSCOPY (EGD) WITH PROPOFOL;  Surgeon: Ronald Lobo, MD;  Location: [facility];  Service: Endoscopy;  Laterality: N/A;  . FACIAL NERVE  DECOMPRESSION  2001/2002   bells palsy   . LAPAROSCOPIC CHOLECYSTECTOMY  06/15/2011    Dr Dalbert Batman  . PERICARDIOCENTESIS N/A 01/13/2019   Procedure: PERICARDIOCENTESIS;  Surgeon: Burnell Blanks, MD;  Location: [facility] CV LAB;  Service: Cardiovascular;  Laterality: N/A;  . RIGHT AND LEFT HEART CATH N/A 01/13/2019   Procedure: RIGHT AND LEFT HEART CATH;  Surgeon: Jolaine Artist, MD;  Location: [facility] CV LAB;  Service: Cardiovascular;  Laterality: N/A;  . TEE WITHOUT CARDIOVERSION N/A 12/17/2013   Procedure: TRANSESOPHAGEAL ECHOCARDIOGRAM (TEE);  Surgeon: Pixie Casino, MD;  Location: [facility] ENDOSCOPY;  Service: Cardiovascular;  Laterality: N/A;  trish/ja  . TRANSTHORACIC ECHOCARDIOGRAM  123456   LV systolic function normal with mild conc LVH; LA mildly dilated; trace MR/TR  . UPPER GI ENDOSCOPY  2009   negative    Current Medications: No outpatient medications have been marked as taking for the 08/05/19 encounter (Office Visit) with Sanda Klein, MD.     Allergies:   Benadryl [diphenhydramine hcl], Clopidogrel bisulfate, and Contrast media [iodinated diagnostic agents]   Social History   Socioeconomic History  . Marital status: Married    Spouse name: Not on file  . Number of children: 2  . Years of education: master's  . Highest education level: Not on file  Occupational History  . Occupation: Electrical engineer    Employer: [employer]  Tobacco Use  . Smoking status: Former Smoker    Packs/day: 1.50    Years: 30.00    Pack years: 45.00    Types: Cigarettes    Quit date: 08/13/1993    Years since quitting: 26.0  . Smokeless tobacco: Never Used  Substance and Sexual Activity  . Alcohol use: No  . Drug use: No  . Sexual activity: Not on file  Other Topics Concern  . Not on file  Social History Narrative  . Not on file   Social Determinants of Health   Financial Resource Strain:   . Difficulty of Paying Living Expenses: Not on file  Food  Insecurity:   . Worried About Charity fundraiser in the Last Year: Not on file  . Ran Out of Food in the Last Year: Not on file  Transportation Needs:   . Lack of Transportation (Medical): Not on file  . Lack of Transportation (Non-Medical): Not on file  Physical Activity:   . Days of Exercise per Week: Not on file  . Minutes of Exercise per Session: Not on file  Stress:   . Feeling of Stress : Not on file  Social Connections:   . Frequency of Communication with Friends and Family: Not on file  . Frequency of Social Gatherings with Friends and Family: Not on file  . Attends Religious Services: Not on file  . Active Member of Clubs or Organizations: Not on file  . Attends Archivist Meetings: Not on file  . Marital Status: Not on file     Family History: The patient's family history includes Cancer in her brother and  sister; Colon cancer (age of onset: 40) in her brother; Colon polyps in her sister; Emphysema (age of onset: 64) in her mother; Gout in her brother; Heart attack (age of onset: 74) in her father; Hyperlipidemia in her brother; Hypertension in her brother; Pneumonia in her maternal grandmother; Ulcers in her mother. There is no history of Diabetes or Stroke.  ROS:   Please see the history of present illness.    All other systems are reviewed and are negative.  EKGs/Labs/Other Studies Reviewed:    The following studies were reviewed today: Echocardiogram May 19, 2019  1. Left ventricular ejection fraction, by visual estimation, is 45 to 50%. The left ventricle has mildly decreased function. Left ventricular septal wall thickness was normal. There is no left ventricular hypertrophy.  2. Left ventricular diastolic Doppler parameters are indeterminate pattern of LV diastolic filling.  3. The LV endocardium is not well seen EF looks to be about 45-50% with septal dyssynergy. The inferior wall is not seen well but appears to be hypokinetic from base to  midsection.  4. Global right ventricle has mildly reduced systolic function.The right ventricular size is normal. No increase in right ventricular wall thickness.  5. Left atrial size was moderately dilated.  6. Right atrial size was moderately dilated.  7. Mild mitral annular calcification.  8. The mitral valve is normal in structure. Trace mitral valve regurgitation.  9. The tricuspid valve is normal in structure. Tricuspid valve regurgitation is mild. 10. The aortic valve is normal in structure. Aortic valve regurgitation is trivial by color flow Doppler. Mild aortic valve sclerosis without stenosis. 11. The pulmonic valve was grossly normal. Pulmonic valve regurgitation is trivial by color flow Doppler. 12. The inferior vena cava is dilated in size with >50% respiratory variability, suggesting right atrial pressure of 8 mmHg  EKG:  EKG is ordered today shows atrial fibrillation with controlled ventricular rate, QTC 455 ms.  Recent Labs: 10/02/2018: TSH 2.21 01/16/2019: Magnesium 2.1 02/16/2019: ALT 14; Pro B Natriuretic peptide (BNP) 443.0 04/02/2019: BNP 149.8 06/04/2019: BUN 24; Creatinine, Ser 1.01; Hemoglobin 12.0; Platelets 275; Potassium 4.2; Sodium 131  Recent Lipid Panel    Component Value Date/Time   CHOL 118 10/02/2018 1203   TRIG 123.0 10/02/2018 1203   HDL 51.10 10/02/2018 1203   CHOLHDL 2 10/02/2018 1203   VLDL 24.6 10/02/2018 1203   LDLCALC 42 10/02/2018 1203   LDLDIRECT 143.4 01/23/2008 1316    Physical Exam:    VS:  BP 134/66   Pulse 83   Ht 5\' 2"  (1.575 m)   Wt 183 lb (83 kg)   BMI 33.47 kg/m     Wt Readings from Last 3 Encounters:  08/05/19 183 lb (83 kg)  07/06/19 183 lb (83 kg)  06/04/19 179 lb (81.2 kg)     General: Alert, oriented x3, no distress, mildly obese Head: no evidence of trauma, PERRL, EOMI, no exophtalmos or lid lag, no myxedema, no xanthelasma; normal ears, nose and oropharynx Neck: normal jugular venous pulsations and no hepatojugular  reflux; brisk carotid pulses without delay and no carotid bruits Chest: clear to auscultation, no signs of consolidation by percussion or palpation, normal fremitus, symmetrical and full respiratory excursions Cardiovascular: normal position and quality of the apical impulse, irregular rhythm, normal first and second heart sounds, no murmurs, rubs or gallops Abdomen: no tenderness or distention, no masses by palpation, no abnormal pulsatility or arterial bruits, normal bowel sounds, no hepatosplenomegaly Extremities: no clubbing, cyanosis or edema; 2+ radial,  ulnar and brachial pulses bilaterally; 2+ right femoral, posterior tibial and dorsalis pedis pulses; 2+ left femoral, posterior tibial and dorsalis pedis pulses; no subclavian or femoral bruits Neurological: grossly nonfocal Psych: Normal mood and affect   ASSESSMENT:    1. Longstanding persistent atrial fibrillation ([place])   2. Chronic systolic heart failure ([place])   3. Long term (current) use of anticoagulants   4. Coronary artery disease involving native coronary artery of native heart without angina pectoris   5. Essential hypertension   6. Dyslipidemia   7. Diabetes mellitus type 2 in obese ([place])   8. Iron deficiency anemia due to chronic blood loss    PLAN:    In order of problems listed above:  1. AFib: Rate controlled at rest, but describe symptoms suggesting periods of rapid ventricular rate with symptoms of dyspnea.  We will increase the metoprolol to a total of 200 mg daily.  Appropriately anticoagulated.  CHA2DS2-VASc 6 (age, gender, hypertension, diabetes, CAD, history of heart failure/LV dysfunction). 2. CHF: Appears well compensated.  Most recent LVEF 45-50%, likely to continue improving with sustained rate control, since the mechanism appears to be tachycardia related.  On maximum dose ACE inhibitor and high-dose metoprolol.  Does not require loop diuretics, but is taking SGLT2 inhibitor. 3. Anticoagulation: No recent  bleeding problems.  Had hemopericardium earlier this year, microcytic anemia was improving when last checked.  Does not have a history of stroke or TIA. 4. CAD: Denies angina even during periods of tachycardia..  Normal systolic function.  No revascularization procedures since 2009; with low risk nuclear stress test 2012 5. HTN: Adequately controlled. 6. HLP: Most recent LDL cholesterol 42, well within target range.  Plan to have repeat labs early next year. 7. DM: Well-controlled, most recent hemoglobin A1c 7%, a little higher than earlier in the year.  On SGLT2 inhibitor and Metformin as well as sitagliptin.  Weight loss encouraged.   Medication Adjustments/Labs and Tests Ordered: Current medicines are reviewed at length with the patient today.  Concerns regarding medicines are outlined above.  Orders Placed This Encounter  Procedures  . Comprehensive metabolic panel  . Lipid panel  . EKG 12-Lead   Meds ordered this encounter  Medications  . metoprolol succinate (TOPROL-XL) 100 MG 24 hr tablet    Sig: Take 1 tablet (100 mg total) by mouth 2 (two) times daily. Take with or immediately following a meal.    Dispense:  180 tablet    Refill:  1    Patient Instructions  Medication Instructions:  INCREASE the Metoprolol Succinate to 100 mg twice daily  *If you need a refill on your cardiac medications before your next appointment, please call your pharmacy*  Lab Work: Your provider would like for you to return in a few months to have the following labs drawn: CMET and fasting Lipid. You do not need an appointment for the lab. Once in our office lobby there is a podium where you can sign in and ring the doorbell to alert [place] that you are here. The lab is open from 8:00 am to 4:30 pm; closed for lunch from 12:45pm-1:45pm.  If you have labs (blood work) drawn today and your tests are completely normal, you will receive your results only by: [name] MyChart Message (if you have MyChart) OR . A  paper copy in the mail If you have any lab test that is abnormal or we need to change your treatment, we will call you to review the results.  Testing/Procedures: None ordered  Follow-Up: At [facility], you and your health needs are our priority.  As part of our continuing mission to provide you with exceptional heart care, we have created designated Provider Care Teams.  These Care Teams include your primary Cardiologist (physician) and Advanced Practice Providers (APPs -  Physician Assistants and Nurse Practitioners) who all work together to provide you with the care you need, when you need it.  Your next appointment:   3 month(s)  The format for your next appointment:   In Person  Provider:   You may see Sanda Klein, MD or one of the following Advanced Practice Providers on your designated Care Team:    Almyra Deforest, PA-C  Fabian Sharp, [place] or   Roby Lofts, PA-C      Signed, Sanda Klein, MD  08/08/2019 5:40 PM    [place]

## 2019-08-05 NOTE — Patient Instructions (Signed)
Medication Instructions:  INCREASE the Metoprolol Succinate to 100 mg twice daily  *If you need a refill on your cardiac medications before your next appointment, please call your pharmacy*  Lab Work: Your provider would like for you to return in a few months to have the following labs drawn: CMET and fasting Lipid. You do not need an appointment for the lab. Once in our office lobby there is a podium where you can sign in and ring the doorbell to alert [place] that you are here. The lab is open from 8:00 am to 4:30 pm; closed for lunch from 12:45pm-1:45pm.  If you have labs (blood work) drawn today and your tests are completely normal, you will receive your results only by: [name] MyChart Message (if you have MyChart) OR . A paper copy in the mail If you have any lab test that is abnormal or we need to change your treatment, we will call you to review the results.  Testing/Procedures: None ordered  Follow-Up: At [facility], you and your health needs are our priority.  As part of our continuing mission to provide you with exceptional heart care, we have created designated Provider Care Teams.  These Care Teams include your primary Cardiologist (physician) and Advanced Practice Providers (APPs -  Physician Assistants and Nurse Practitioners) who all work together to provide you with the care you need, when you need it.  Your next appointment:   3 month(s)  The format for your next appointment:   In Person  Provider:   You may see Sanda Klein, MD or one of the following Advanced Practice Providers on your designated Care Team:    Almyra Deforest, PA-C  Fabian Sharp, PA-C or   Roby Lofts, [place]

## 2019-08-08 ENCOUNTER — Encounter: Payer: Self-pay | Admitting: Cardiovascular Disease

## 2019-08-16 ENCOUNTER — Other Ambulatory Visit: Payer: Self-pay | Admitting: Internal Medicine

## 2019-08-17 NOTE — Telephone Encounter (Signed)
Filled 04/24/2019 Clonazepam 0.5 Mg Tablet 10#  Last ov 07/06/19 Next ov n/s

## 2019-08-24 DIAGNOSIS — Z20822 Contact with and (suspected) exposure to covid-19: Secondary | ICD-10-CM | POA: Diagnosis not present

## 2019-08-25 ENCOUNTER — Ambulatory Visit: Payer: PPO | Admitting: Neurology

## 2019-08-31 ENCOUNTER — Encounter: Payer: Self-pay | Admitting: Internal Medicine

## 2019-09-09 ENCOUNTER — Other Ambulatory Visit: Payer: Self-pay | Admitting: Internal Medicine

## 2019-09-10 ENCOUNTER — Ambulatory Visit: Payer: PPO | Attending: Internal Medicine

## 2019-09-10 DIAGNOSIS — Z20822 Contact with and (suspected) exposure to covid-19: Secondary | ICD-10-CM | POA: Diagnosis not present

## 2019-09-10 NOTE — Progress Notes (Signed)
no

## 2019-09-11 ENCOUNTER — Ambulatory Visit: Payer: PPO

## 2019-09-11 LAB — NOVEL CORONAVIRUS, NAA: SARS-CoV-2, NAA: NOT DETECTED

## 2019-09-12 ENCOUNTER — Encounter: Payer: Self-pay | Admitting: Internal Medicine

## 2019-09-16 ENCOUNTER — Ambulatory Visit: Payer: PPO

## 2019-09-19 ENCOUNTER — Ambulatory Visit: Payer: PPO | Attending: Internal Medicine

## 2019-09-19 DIAGNOSIS — Z23 Encounter for immunization: Secondary | ICD-10-CM | POA: Insufficient documentation

## 2019-09-19 NOTE — Progress Notes (Signed)
   Covid-19 Vaccination Clinic  Name:  [name]    MRN: [id number] DOB: Jun 10, 1951  09/19/2019  [name] was observed post Covid-19 immunization for 15 minutes without incidence. She was provided with Vaccine Information Sheet and instruction to access the V-Safe system.   [name] was instructed to call 911 with any severe reactions post vaccine: [name] Difficulty breathing  . Swelling of your face and throat  . A fast heartbeat  . A bad rash all over your body  . Dizziness and weakness    Immunizations Administered    Name Date Dose VIS Date Route   Pfizer COVID-19 Vaccine 09/19/2019  4:43 PM 0.3 mL 07/24/2019 Intramuscular   Manufacturer: [place]   Lot: [id number]   [place]: [id number]

## 2019-09-22 ENCOUNTER — Other Ambulatory Visit: Payer: Self-pay | Admitting: Internal Medicine

## 2019-09-22 ENCOUNTER — Encounter: Payer: Self-pay | Admitting: Internal Medicine

## 2019-09-22 ENCOUNTER — Other Ambulatory Visit (HOSPITAL_COMMUNITY): Payer: Self-pay | Admitting: Internal Medicine

## 2019-09-23 ENCOUNTER — Other Ambulatory Visit: Payer: Self-pay | Admitting: *Deleted

## 2019-09-23 DIAGNOSIS — R0789 Other chest pain: Secondary | ICD-10-CM

## 2019-09-24 ENCOUNTER — Ambulatory Visit (INDEPENDENT_AMBULATORY_CARE_PROVIDER_SITE_OTHER): Payer: PPO | Admitting: Internal Medicine

## 2019-09-24 ENCOUNTER — Encounter: Payer: Self-pay | Admitting: Internal Medicine

## 2019-09-24 DIAGNOSIS — I509 Heart failure, unspecified: Secondary | ICD-10-CM | POA: Diagnosis not present

## 2019-09-24 DIAGNOSIS — R197 Diarrhea, unspecified: Secondary | ICD-10-CM | POA: Diagnosis not present

## 2019-09-24 DIAGNOSIS — J449 Chronic obstructive pulmonary disease, unspecified: Secondary | ICD-10-CM | POA: Diagnosis not present

## 2019-09-24 NOTE — Assessment & Plan Note (Signed)
Unclear if this represents flare, needs to be seen and assessed and possible CXR at respiratory clinic. Visit made for tomorrow.

## 2019-09-24 NOTE — Progress Notes (Addendum)
Virtual Visit via Audio Note  I connected with [name] on 09/24/19 at  3:00 PM EST by an audio-only enabled telemedicine application and verified that I am speaking with the correct person using two identifiers.  The patient and the provider were at separate locations throughout the entire encounter.   I discussed the limitations of evaluation and management by telemedicine and the availability of in person appointments. The patient expressed understanding and agreed to proceed. The patient and the provider were the only parties present for the visit unless noted in HPI below.  History of Present Illness: The patient is a 69 y.o. female with visit for SOB, chest tightness, diarrhea, fatigue. She has permanent A fib and struggles with how much of her symptoms are coming from that. Denies significant change in weight. She also has history of fluid around her heart. She also has history of low blood counts with anemia which did cause SOB and fatigue. Tested for covid-19 due to URI symptoms in family memory mid January which was negative. Separately she has been having diarrhea for the last 6 weeks. Accompanied by stomach cramping. Denies blood in stools or dark stools. No change in medications or doses in that same time period. Denies weight loss or gain. Did get first covid-19 vaccine 09/19/19. Overall it is worsening. Has tried imodium for diarrhea which does help for a day or two but does not help stomach cramping.  Observations/Objective: Voice strong, A and O times 3, no coughing or dyspnea during visit  Assessment and Plan: See problem oriented charting  Follow Up Instructions: needs visit at respiratory clinic to assess whether this is more likely CHF, lung issue, or could be related to anemia again. Asked her to stop janumet for 2 weeks to see if this helps with diarrhea.   Visit time 13 minutes in non-face to face communication with patient and coordination of care.  I discussed the  assessment and treatment plan with the patient. The patient was provided an opportunity to ask questions and all were answered. The patient agreed with the plan and demonstrated an understanding of the instructions.   The patient was advised to call back or seek an in-person evaluation if the symptoms worsen or if the condition fails to improve as anticipated.  Hoyt Koch, MD

## 2019-09-24 NOTE — Addendum Note (Signed)
Addended by: Pricilla Holm A on: 09/24/2019 04:00 PM   Modules accepted: Level of Service

## 2019-09-24 NOTE — Assessment & Plan Note (Signed)
Unclear etiology although going on 6 weeks so not likely symptom of possible covid-19. Asked her to stop janumet for 2 weeks to see if this helps. If no improvement needs GI pathogen panel. No recent antibiotics or hospital stay to make C dif a likely possibility.

## 2019-09-24 NOTE — Assessment & Plan Note (Signed)
Unclear if this represents flare. Needs to be seen at respiratory clinic.

## 2019-09-25 ENCOUNTER — Ambulatory Visit: Payer: PPO

## 2019-09-25 ENCOUNTER — Telehealth: Payer: Self-pay

## 2019-09-25 NOTE — Telephone Encounter (Signed)
New message  Patient wife Edd Fabian calling is aware that Dr. Sharlet Salina is on vacation & the nurse is off today.   Was advise by Jonelle Sidle @ respiratory clinic appt scheduled on 2/12 @ 6:30 pm will cancel out   Need a referral to [facility] to have a chest x-ray.   Need an order of blood work to be drawn over the weekend.

## 2019-09-25 NOTE — Telephone Encounter (Addendum)
I spoke with [name] and she thinks pt needs pulmonary consult and f/u for with cardiologist for chronic conditions. I tried to call pt to advise but there si no answer. I called her sister who is on her DPR to explain why we need to cancel the appt. She understood and will relay the message to pt.   I spoke with [name] and I explained to her that our clinic is for patients who have covid or suspected covid. She doesn't want the exposure and thinks the patient has bronchitis. I told her we would be glad to see her but we are very limited on what we can treat and assess. I offered to find some places where she could walk in to get an xray. She is going to call Dr. Nathanial Millman office and get back to me.    Follow Up Instructions: needs visit at respiratory clinic to assess whether this is more likely CHF, lung issue, or could be related to anemia again. Asked her to stop janumet for 2 weeks to see if this helps with diarrhea.   I discussed the assessment and treatment plan with the patient. The patient was provided an opportunity to ask questions and all were answered. The patient agreed with the plan and demonstrated an understanding of the instructions.  The patient was advised to call back or seek an in-person evaluation if the symptoms worsen or if the condition fails to improve as anticipated.  Hoyt Koch, MD

## 2019-09-26 ENCOUNTER — Other Ambulatory Visit: Payer: Self-pay | Admitting: Internal Medicine

## 2019-09-29 NOTE — Telephone Encounter (Signed)
Due to her symptoms she cannot go for labs or chest x-ray. She needs visit at respiratory clinic due to covid-10 regulations. If she did not keep her appointment she can have another offered to her.

## 2019-09-29 NOTE — Telephone Encounter (Signed)
Called pt, LVM.   

## 2019-09-30 ENCOUNTER — Other Ambulatory Visit: Payer: Self-pay | Admitting: Neurology

## 2019-10-01 ENCOUNTER — Other Ambulatory Visit: Payer: Self-pay | Admitting: Cardiovascular Disease

## 2019-10-02 ENCOUNTER — Telehealth: Payer: Self-pay | Admitting: Cardiovascular Disease

## 2019-10-02 NOTE — Telephone Encounter (Signed)
New message    *STAT* If patient is at the pharmacy, call can be transferred to refill team.   1. Which medications need to be refilled? (please list name of each medication and dose if known) metoprolol succinate (TOPROL-XL) 100 MG 24 hr tablet but patient wants the 50 mg instead   2. Which pharmacy/location (including street and city if local pharmacy) is medication to be sent to?[place], [place]  3. Do they need a 30 day or 90 day supply? [place]

## 2019-10-05 NOTE — Telephone Encounter (Signed)
Medication was sent to pharmacy on 08/05/19 with a 6 month supply

## 2019-10-06 ENCOUNTER — Telehealth: Payer: Self-pay | Admitting: Internal Medicine

## 2019-10-06 NOTE — Progress Notes (Signed)
  Chronic Care Management   Outreach Note  10/06/2019 Name: [name] MRN: [id number] DOB: [birth year]  Referred by: Hoyt Koch, MD Reason for referral : No chief complaint on file.   An unsuccessful telephone outreach was attempted today. The patient was referred to the pharmacist for assistance with care management and care coordination.   Follow Up Plan:   Raynicia Dukes [employer] Scheduler

## 2019-10-08 ENCOUNTER — Other Ambulatory Visit: Payer: Self-pay | Admitting: *Deleted

## 2019-10-08 MED ORDER — METOPROLOL SUCCINATE ER 50 MG PO TB24: 100.0000 mg | ORAL_TABLET | Freq: Two times a day (BID) | ORAL | 1 refills | Status: DC

## 2019-10-09 ENCOUNTER — Ambulatory Visit
Admission: RE | Admit: 2019-10-09 | Discharge: 2019-10-09 | Disposition: A | Discharge status: Home / Self Care | Payer: PPO | Source: Ambulatory Visit | Attending: Cardiovascular Disease | Admitting: Cardiovascular Disease

## 2019-10-09 ENCOUNTER — Other Ambulatory Visit: Payer: Self-pay

## 2019-10-09 DIAGNOSIS — R0789 Other chest pain: Secondary | ICD-10-CM

## 2019-10-14 ENCOUNTER — Ambulatory Visit: Payer: PPO | Attending: Internal Medicine

## 2019-10-14 DIAGNOSIS — Z23 Encounter for immunization: Secondary | ICD-10-CM

## 2019-10-14 NOTE — Progress Notes (Signed)
   Covid-19 Vaccination Clinic  Name:  [name]    MRN: [id number] DOB: [birth year]  10/14/2019  [name] was observed post Covid-19 immunization for 15 minutes without incident. She was provided with Vaccine Information Sheet and instruction to access the V-Safe system.   [name] was instructed to call 911 with any severe reactions post vaccine: [name] Difficulty breathing  . Swelling of face and throat  . A fast heartbeat  . A bad rash all over body  . Dizziness and weakness   Immunizations Administered    Name Date Dose VIS Date Route   Pfizer COVID-19 Vaccine 10/14/2019  3:54 PM 0.3 mL 07/24/2019 Intramuscular   Manufacturer: [place]   Lot: [id number]   [place]: [id number]

## 2019-10-22 ENCOUNTER — Other Ambulatory Visit: Payer: Self-pay | Admitting: Cardiovascular Disease

## 2019-10-22 ENCOUNTER — Other Ambulatory Visit: Payer: Self-pay | Admitting: Internal Medicine

## 2019-10-23 DIAGNOSIS — H40053 Ocular hypertension, bilateral: Secondary | ICD-10-CM | POA: Diagnosis not present

## 2019-10-23 DIAGNOSIS — E119 Type 2 diabetes mellitus without complications: Secondary | ICD-10-CM | POA: Diagnosis not present

## 2019-10-23 DIAGNOSIS — H40013 Open angle with borderline findings, low risk, bilateral: Secondary | ICD-10-CM | POA: Diagnosis not present

## 2019-10-23 DIAGNOSIS — H5213 Myopia, bilateral: Secondary | ICD-10-CM | POA: Diagnosis not present

## 2019-10-23 LAB — HM DIABETES EYE EXAM

## 2019-10-27 ENCOUNTER — Encounter: Payer: Self-pay | Admitting: Internal Medicine

## 2019-11-02 ENCOUNTER — Encounter: Payer: Self-pay | Admitting: Neurology

## 2019-11-02 ENCOUNTER — Other Ambulatory Visit: Payer: Self-pay

## 2019-11-02 ENCOUNTER — Ambulatory Visit: Payer: PPO | Admitting: Neurology

## 2019-11-02 DIAGNOSIS — D689 Coagulation defect, unspecified: Secondary | ICD-10-CM

## 2019-11-02 DIAGNOSIS — E662 Morbid (severe) obesity with alveolar hypoventilation: Secondary | ICD-10-CM | POA: Diagnosis not present

## 2019-11-02 DIAGNOSIS — R570 Cardiogenic shock: Secondary | ICD-10-CM

## 2019-11-02 DIAGNOSIS — E1169 Type 2 diabetes mellitus with other specified complication: Secondary | ICD-10-CM | POA: Diagnosis not present

## 2019-11-02 DIAGNOSIS — I82462 Acute embolism and thrombosis of left calf muscular vein: Secondary | ICD-10-CM | POA: Diagnosis not present

## 2019-11-02 DIAGNOSIS — I25119 Atherosclerotic heart disease of native coronary artery with unspecified angina pectoris: Secondary | ICD-10-CM

## 2019-11-02 DIAGNOSIS — E669 Obesity, unspecified: Secondary | ICD-10-CM | POA: Diagnosis not present

## 2019-11-02 DIAGNOSIS — I4811 Longstanding persistent atrial fibrillation: Secondary | ICD-10-CM | POA: Diagnosis not present

## 2019-11-02 MED ORDER — ROPINIROLE HCL ER 2 MG PO TB24: 2.0000 mg | ORAL_TABLET | Freq: Every day | ORAL | 5 refills | Status: DC

## 2019-11-02 MED ORDER — ROPINIROLE HCL 0.5 MG PO TABS: ORAL_TABLET | ORAL | 5 refills | Status: DC

## 2019-11-02 NOTE — Progress Notes (Signed)
SLEEP MEDICINE CLINIC   Provider:  Larey Seat, M D  Referring Provider: Hoyt Koch, * Primary Care Physician:  Hoyt Koch, MD  Chief Complaint  Patient presents with  . Follow-up    pt alone, rm 10. pt states that things are well. RLS treated with medications.     11-02-2019; [name] is a 69 year old Caucasian patient of Dr. Pricilla Holm, MD.  She presents today after a 56-month hiatus.  The last time I saw her she had pericardial effusions, developed headaches dizziness and had fallen with a large hematoma to the right temple.  She states now that she has not  continued to-  fall to  fall out of bed. As she reports that there was a flurry of falls out of bed in June, July and August 2020.  She has a history of chronic atrial fibrillation.  She has regained fluid weight is abdominal distention.  She has a remote history of smoking over 25 years ago she quit.  She is tachycardic here today as she checked in and her blood pressure was elevated 152/90, pulse rate was 124.  She has been responding well to the treatment of restless legs with medication.  She endorsed the Epworth Sleepiness Scale at 6 out of 24 points which is a good response, she reports a high degree of fatigue. She is trying very day to walk a little more distance.  She will see Dr. Benn Moulder next month, blood work is next week.    03-05-2019,  [name] is a  69 year old caucasian left handed patient. Presenting for urgent Visit.  Interval history for anew concern of confusion, delayed word retrieval following a fall out of bed, head injury.  She had a headache , a little nausea and about a day later she developed headaches, dizziness and a large hematoma on the right temple.   There has been a progression in her heart failure.  Her interval history is quite remarkable patient informed me in a MyChart message that she had been admitted to [facility] on May 31 after developing  increasing shortness of breath and extreme weakness.  She was found to have pulmonary edema, atrial fibrillation and pericardial effusion was evaluated for underlying malignancy.  She had almost 2 L of fluid drained from the pericardium and lives she stated.  She was in the hospital for belly a week about the third week after she returned home she had a fall out of bed.  The CT of the chest abdomen and pelvis was performed without contrast documented small amount of ascites primarily in the pelvis bilateral flank edema anasarca with bilateral pleural effusions, ascites and bilateral flank edema.  A previous CT had already described a similar small nodule to the left adrenal gland this has not changed and is likely incidental.  The previous CT was from 2003.  CT of the chest showed that the heart was mildly enlarged.there was still a small residual pericardial effusion and the mediastinum did not show enlarged lymph nodes there were pleural effusions small left more than right atelectasis in both lower lobes.  No infiltration, no pancreatic abnormality noted normal spleen, normal bowel anatomy.  The studies are from 14 January 2019. The discharge summary dated 16 January 2019 stated that the patient would be on fluid restriction she was discharged on torsemide, on Cipro, she will be followed for elevated liver function tests-transaminases, and her work-up for anemia will be continued.  At the time there was no neurologic concerns this arose from the fall 3 weeks later.     HPI: 06-10-2017, I had the opportunity of meeting [name] on February 28 last year, about 20 months ago. In the meantime she would like to be further evaluated a past medical history has not significantly changed. Her chief complaints of neither. She continues to be treated on Xarelto for atrial fibrillation with rapid ventricular response, takes Cardizem for rate control, gabapentin, estradiol, Invokana, Symbicort inhaler, Janumet, she still  taking Paxil, but she is no longer taking Ultram.  She is still in need of a formal sleep evaluation, her atrial fibrillation has been difficult to control. She has failed 4 cardio-versions. Could not take Tikosyn as it interferes with her paxil.  insisted on continuing Paxil, out of fear to have panic attacks again. 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 She lives with her wife, has a supportive sister. She adopted 2 children.      HPI:  [name] is a 69 y.o. female , seen here as a referral from Dr. Sharlet Salina for a sleep consultation,   Chief complaint according to patient : " insomnia- difficulties to go to sleep , but I can stay asleep" . 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  Mrs. Lauman is a patient with atrial fibrillation and sleep initiation insomnia, she also had a Bell's palsy in 2001, a cholecystectomy in 2012 cardiac stenting in 2009 and a hysterectomy in 1982. Her past medical history includes hypertension, diabetes, hypercholesterolemia, heart disease atrial fibrillation, anxiety and migraine. In her review of systems she endorsed further easy bruising, shortness of breath, chest pain and palpitation being aware of a cardiac murmur, restless legs and headaches. It was upon further recommendation of her cardiologist at the atrial fibrillation clinic at [facility] where she was recommended to undergo a sleep study. The patient was educated about the correlation between atrial fibrillation and obstructive sleep apnea. She sees Dr. Quay Burow. She has a panic disorder and takes Ativan.   Paroxysmal atrial fibrillation was first diagnosed in May 2015 with a successful conversion 2 first of May of the same year and after that she relapsed into a fib again in September of the same year. She then suffered in early 2017 recurrent episode and her cardiologist increased her metoprolol 250 minute times a day to control the rate. She underwent another  cardioversion in feb 2017 ,  converted on the dose and has been maintaining sinus rhythm for one week.  Sleep habits are as follows: She takes Lorazepam at night and melatonin. She sleeps on her right side. She wakes up prone. Bedroom is cold, quiet and dark.  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 Working 2- 8 PM, late dinner, reading. And going to bed after midnight after some " me time" . She sleeps 7-8 hours and often rises at noon. She sleeps with her spouse, she reportedly kicks her wife a lot.  She often wakes with palpitations and noted a very high blood pressure. She has woken diaphoretic.  Her spouse wakes up "a zillion times" but can go to sleep. Delayed sleep cycle for years . On weekends she sleeps 10-12 hours , 4 AM  until 2 PM. She does not nap in daytime. She does drink coffee after waking up and after work (!).  She wakes with a dry mouth and  no headaches.  Sleep medical history and family sleep history: her sister has hypersomnia. Social history:  Married to same sex partner,  2 daughters 28 and 85. No ETOH, No tobacco, little  caffeine.    01-22-2018, I have the pleasure of seeing [name] and [place] today patient I had last encounter prior to scheduling a sleep study for her.  She has a history of migraine, persistent atrial fibrillation, coronary artery disease with cardiac stents, obesity, diabetes and hypertension.  She was referred by cardiologist Dr. Alvester Chou and primary care physician Pricilla Holm at the time.  Her sleep study revealed no significant apnea, mild snoring moderate snoring when supine, persistent atrial fibrillation, and severe periodic limb movements with related arousals at 14.6 times per hour of sleep.  I felt strongly that her PLM's are the main culprit also.  Periodic limb movements can be related to medication side effects but my main suspicion was that we need to check for her spinal anatomy, any neuropathy or  any iron deficiency.  A ferritin level had to be obtained which was reportedly  very low and the patient was finally treated for iron deficiency and anemia.  In the meantime before the iron deficiency was evident she had seen Dr. Lynann Bologna,  who followed up with her recently  April 2019 and stated that she had no explanation for the progressive balance loss and pain in the extremities and could not correlate this quite to the mild-spinal stenosis as found between L5 and S1.   The patient states that after she was transfused with 2 units her symptoms improved drastically but now are slowly coming back.  She is scheduled for an upper and lower GI endoscopy tomorrow.  This is meant to discover were blood may be lost. There is blood is in her stool. Dr Wallis Mart had seen her 3 years ago and warned about Gi Bleed. Iron deficiency caused severe RLS, and now she can sit still for a while.  On Requip.    Review of Systems: Out of a complete 14 system review, the patient complains of only the following symptoms, and all other reviewed systems are negative.  Improved RLS, sleeps only 5-7 hours, and later in daytime.  She l has not slept some night.  FSS  29 down from 61, now up to 48 again  Epworth remained at  7 points.  Geriatric depression score 2   Social History   Socioeconomic History  . Marital status: Married    Spouse name: Not on file  . Number of children: 2  . Years of education: master's  . Highest education level: Not on file  Occupational History  . Occupation: Electrical engineer    Employer: [employer]  Tobacco Use  . Smoking status: Former Smoker    Packs/day: 1.50    Years: 30.00    Pack years: 45.00    Types: Cigarettes    Quit date: 08/13/1993    Years since quitting: 26.2  . Smokeless tobacco: Never Used  Substance and Sexual Activity  . Alcohol use: No  . Drug use: No  . Sexual activity: Not on file  Other Topics Concern  . Not on file  Social History  Narrative  . Not on file   Social Determinants of Health   Financial Resource Strain:   . Difficulty of Paying Living Expenses:   Food Insecurity:   . Worried About Charity fundraiser in the Last Year:   . Ran  Out of Food in the Last Year:   Transportation Needs:   . Lack of Transportation (Medical):   [name] Lack of Transportation (Non-Medical):   Physical Activity:   . Days of Exercise per Week:   . Minutes of Exercise per Session:   Stress:   . Feeling of Stress :   Social Connections:   . Frequency of Communication with Friends and Family:   . Frequency of Social Gatherings with Friends and Family:   . Attends Religious Services:   . Active Member of Clubs or Organizations:   . Attends Archivist Meetings:   [name] Marital Status:   Intimate Partner Violence:   . Fear of Current or Ex-Partner:   . Emotionally Abused:   [name] Physically Abused:   . Sexually Abused:     Family History  Problem Relation Age of Onset  . Heart attack Father 90       2nd MI at 64  . Colon cancer Brother 77  . Gout Brother   . Ulcers Mother   . Emphysema Mother 17  . Colon polyps Sister   . Cancer Sister        Basal cell carcinoma  . Pneumonia Maternal Grandmother   . Hypertension Brother   . Hyperlipidemia Brother   . Cancer Brother        Skin  . Diabetes Neg Hx   . Stroke Neg Hx     Past Medical History:  Diagnosis Date  . Anemia, unspecified   . Arthritis   . Asthma   . Atrial fibrillation with RVR ([place])    a. on Xarelto  . Bell's palsy    Facial nerve decompression in 2001  . CHF (congestive heart failure) ([place])   . Chronic low back pain   . COPD with asthma ([place])   . Coronary artery disease    Myoview 04/12/11 was entirely normal. ECHO 02/26/08 showed only minor abnormalities. Stenting 05/26/08 of her posterolateral branch to the left circumflex coronary artery. Used a 2.5x83mm Taxus Monorail stent.myoview 2014 was without ischemia  . Diabetes mellitus    Type 2  .  Early cataracts, bilateral   . Fatty liver   . GERD (gastroesophageal reflux disease)   . Glaucoma   . Glaucoma   . Goiter   . Heart murmur   . History of nuclear stress test 2012; 2014   lexiscan; normal pattern of perfusion; normal, low risk scan   . Hyperlipidemia   . Hypertension   . Panic disorder   . Pneumonia 2008  . Polycystic ovary disease    Hysterectomy in 1982 for this  . Shortness of breath dyspnea    ECHO 02/26/08 showed only minor abnormalities  . Spinal stenosis     Past Surgical History:  Procedure Laterality Date  . ABDOMINAL HYSTERECTOMY  1982   & BSO; for polycystic ovary disease  . CARDIOVERSION N/A 12/17/2013   Procedure: CARDIOVERSION;  Surgeon: Pixie Casino, MD;  Location: [facility] ENDOSCOPY;  Service: Cardiovascular;  Laterality: N/A;  . CARDIOVERSION N/A 09/30/2015   Procedure: CARDIOVERSION;  Surgeon: Dorothy Spark, MD;  Location: [facility] ENDOSCOPY;  Service: Cardiovascular;  Laterality: N/A;  . CARDIOVERSION N/A 06/22/2016   Procedure: CARDIOVERSION;  Surgeon: Skeet Latch, MD;  Location: [facility] ENDOSCOPY;  Service: Cardiovascular;  Laterality: N/A;  . CENTRAL LINE INSERTION  01/13/2019   Procedure: CENTRAL LINE INSERTION;  Surgeon: Jolaine Artist, MD;  Location: [place] CV LAB;  Service: Cardiovascular;;  . COLONOSCOPY  last 2009; Dr Cristina Gong; due 2019  . COLONOSCOPY WITH PROPOFOL N/A 01/23/2018   Procedure: COLONOSCOPY WITH PROPOFOL;  Surgeon: Ronald Lobo, MD;  Location: [place];  Service: Endoscopy;  Laterality: N/A;  . CORONARY ANGIOPLASTY  05/26/2008   Stenting of her posterolateral branch to the left circumflex coronary artery. Used a 2.5x35mm Taxus Monorail stent.  . ESOPHAGOGASTRODUODENOSCOPY (EGD) WITH PROPOFOL N/A 01/23/2018   Procedure: ESOPHAGOGASTRODUODENOSCOPY (EGD) WITH PROPOFOL;  Surgeon: Ronald Lobo, MD;  Location: [place];  Service: Endoscopy;  Laterality: N/A;  . FACIAL NERVE DECOMPRESSION  2001/2002   bells  palsy   . LAPAROSCOPIC CHOLECYSTECTOMY  06/15/2011    Dr Dalbert Batman  . PERICARDIOCENTESIS N/A 01/13/2019   Procedure: PERICARDIOCENTESIS;  Surgeon: Burnell Blanks, MD;  Location: [place] CV LAB;  Service: Cardiovascular;  Laterality: N/A;  . RIGHT AND LEFT HEART CATH N/A 01/13/2019   Procedure: RIGHT AND LEFT HEART CATH;  Surgeon: Jolaine Artist, MD;  Location: [place] CV LAB;  Service: Cardiovascular;  Laterality: N/A;  . TEE WITHOUT CARDIOVERSION N/A 12/17/2013   Procedure: TRANSESOPHAGEAL ECHOCARDIOGRAM (TEE);  Surgeon: Pixie Casino, MD;  Location: [facility] ENDOSCOPY;  Service: Cardiovascular;  Laterality: N/A;  trish/ja  . TRANSTHORACIC ECHOCARDIOGRAM  123456   LV systolic function normal with mild conc LVH; LA mildly dilated; trace MR/TR  . UPPER GI ENDOSCOPY  2009   negative    Current Outpatient Medications  Medication Sig Dispense Refill  . ascorbic acid (VITAMIN C) 1000 MG tablet Take 1,000 mg by mouth 2 (two) times daily.     [name] CALCIUM-MAG-VIT C-VIT D PO Take 1 tablet by mouth daily.     . celecoxib (CELEBREX) 200 MG capsule     . Continuous Blood Gluc Sensor (FREESTYLE LIBRE SENSOR SYSTEM) MISC Use to monitor sugars 1 each 0  . diltiazem (CARDIZEM CD) 180 MG 24 hr capsule TAKE 1 CAPSULE DAILY. 90 capsule 3  . gabapentin (NEURONTIN) 100 MG capsule TAKE (1) CAPSULE THREE TIMES DAILY. (Patient taking differently: Take 100 mg by mouth 3 (three) times daily. ) 180 capsule 3  . guaiFENesin (MUCINEX) 600 MG 12 hr tablet Take 600 mg by mouth 2 (two) times daily as needed for cough.     [name] HYDROcodone-acetaminophen (NORCO/VICODIN) 5-325 MG tablet TAKE ONE TABLET AT BEDTIME. 30 tablet 0  . INVOKANA 100 MG TABS tablet TAKE 1 TABLET ONCE DAILY. 30 tablet 0  . latanoprost (XALATAN) 0.005 % ophthalmic solution Place 1 drop into both eyes at bedtime.    [name] LORazepam (ATIVAN) 0.5 MG tablet TAKE 1 OR 2 TABLETS AT BEDTIME. 60 tablet 5  . Melatonin 5 MG TABS Take 2-3 tablets by mouth at  bedtime.     . metoprolol succinate (TOPROL-XL) 50 MG 24 hr tablet Take 2 tablets (100 mg total) by mouth 2 (two) times daily. Take with or immediately following a meal. 120 tablet 1  . Multiple Vitamins-Iron (MULTIVITAMINS WITH IRON) TABS Take 1 tablet by mouth daily.     [name] nystatin ointment (MYCOSTATIN) Apply 1 application topically 2 (two) times daily as needed (IRRITATION).   0  . omeprazole (PRILOSEC) 20 MG capsule Take 1 capsule (20 mg total) by mouth 2 (two) times daily before a meal.    . ONETOUCH DELICA LANCETS FINE MISC 1 Units by Does not apply route as directed. 100 each 3  . PARoxetine (PAXIL) 10 MG tablet TAKE 2 AND 1/2 TABS DAILY 225 tablet 0  . potassium chloride (MICRO-K) 10 MEQ CR  capsule TAKE (2) CAPSULES DAILY. 60 capsule 8  . rOPINIRole (REQUIP XL) 2 MG 24 hr tablet TAKE ONE TABLET AT BEDTIME. 30 tablet 5  . rOPINIRole (REQUIP) 0.5 MG tablet TAKE 1 TO 2 TABLETS AT MIDNIGHT. 60 tablet 5  . senna (SENOKOT) 8.6 MG TABS Take 1 tablet by mouth at bedtime.     . sitaGLIPtin-metformin (JANUMET) 50-1000 MG tablet TAKE 1/2 TABLET IN THE MORNING WITH BREAKFAST AND 1 TABLET IN THE EVENING WITH SUPPER. 45 tablet 11  . STIOLTO RESPIMAT 2.5-2.5 MCG/ACT AERS INHALE 2 PUFFS INTO LUNGS ONCE DAILY. 4 g 0  . torsemide (DEMADEX) 20 MG tablet Take 2 tablets (40 mg total) by mouth 2 (two) times daily. 360 tablet 3  . traZODone (DESYREL) 100 MG tablet TAKE 1/2 TO 1 TABLET AT BEDTIME. 90 tablet 3  . XARELTO 20 MG TABS tablet TAKE 1 TABLET ONCE DAILY WITH SUPPER. 90 tablet 0   No current facility-administered medications for this visit.    Allergies as of 11/02/2019 - Review Complete 11/02/2019  Allergen Reaction Noted  . Benadryl [diphenhydramine hcl] Other (See Comments) 06/15/2011  . Clopidogrel bisulfate Nausea Only   . Contrast media [iodinated diagnostic agents] Palpitations 06/15/2011    Vitals: BP (!) 152/90   Pulse (!) 124   Temp (!) 97.2 F (36.2 C)   Ht 5\' 2"  (1.575 m)   Wt  187 lb (84.8 kg)   BMI 34.20 kg/m  Last Weight:  Wt Readings from Last 1 Encounters:  11/02/19 187 lb (84.8 kg)   [id number] mass index is 34.2 kg/m.     Last Height:   Ht Readings from Last 1 Encounters:  11/02/19 5\' 2"  (1.575 m)    Physical exam:  General: The patient is awake, alert and appears not in acute distress. The patient is obese. Head: Normocephalic, atraumatic. Neck is supple. Mallampati 3  neck circumference:16.25 . Nasal airflow intact , TMJ is evident . Retrognathia is seen.   Cardiovascular:  irregular rate and rhythm Respiratory: Lungs are clear to auscultation. Skin:  Without evidence of edema, or rash Trunk: BMI is elevated . Neurologic exam : The patient is awake and alert, oriented to place and time.   She is very pale.  Memory subjective  described as intact. Attention span & concentration ability appears normal.  Montreal Cognitive Assessment  03/05/2019  Visuospatial/ Executive (0/5) 4  Naming (0/3) 3  Attention: Read list of digits (0/2) 2  Attention: Read list of letters (0/1) 1  Attention: Serial 7 subtraction starting at 100 (0/3) 3  Language: Repeat phrase (0/2) 2  Language : Fluency (0/1) 1  Abstraction (0/2) 2  Delayed Recall (0/5) 5  Orientation (0/6) 6  Total 29   MOCA 29/30    Speech is fluent, without dysarthria, mild dysphonia. Mood and affect are appropriate.  Cranial nerves: Pupils are equal and briskly reactive to light. Funduscopic exam deferred. Extraocular movements in vertical and horizontal planes intact and without nystagmus. Visual fields by finger perimetry are intact.Hearing to finger rub intact.  Facial sensation intact to fine touch. Facial motor strength is symmetric and tongue and uvula move midline.  Shoulder shrug was symmetrical.  Motor exam:  Normal tone, muscle bulk and symmetric strength in all extremities. Sensory:  Fine touch, pinprick and vibration in the upper extremities was normal. Coordination:  Finger-to-nose maneuver  without evidence of ataxia, dysmetria or tremor. Gait and station: Patient walks without assistive device. Strength within normal limits.Stance is stable and normal.Tandem  gait is unfragmented. Turns with 3 Steps.  Deep tendon reflexes: in the upper and lower extremities are symmetric and intact. Babinski maneuver response is downgoing.  We are ordering a CT and repeated  MMSE/ MOCA.   Patient is vaccinated for Covid . Started back at her office a 2 days a week.    Assessment:  After physical and neurologic examination, review of laboratory studies,  Personal review of imaging studies, reports of other /same  Imaging studies ,  Results of polysomnography/ neurophysiology testing and pre-existing records as far as provided in visit., my assessment is :  1) transient impaired memory, balance - likely suffered a mild concussion. She is anticoagulated - she could have developed SDH.  Her memory and word retrieval is continuesly improving.   2) [place]a, pericardial effusion - related to viral infection ? No malignancy found.  Her  CHF related to Atrial fibrillation, persistent  -She cannot undergo ablation while in almost constant atrial fibrillation- and her a fib has taken her energy away, her stamina. She just regained a lot of fluid.   3)  PLMs, RLS -  Can be partly due to spinal stenosis, but she responded well to Requip and transfusions.  Refilled Requip.    Plan:  Treatment plan and additional workup :  Postconcussion-  No obvious visual or auditory impairment.  Memory reportedly impaired.   Fluid restrictions are very difficult for her during this hot summer weather, encourage sipping on ice. .  She has a walker, but she ' has no need for one". Had no recent falls. PT , completed.  Requip helps RLS.     Asencion Partridge Deari Sessler MD  11/02/2019   CC: Hoyt Koch, [address],  [place] [id number]

## 2019-11-02 NOTE — Patient Instructions (Signed)

## 2019-11-10 ENCOUNTER — Other Ambulatory Visit: Payer: Self-pay | Admitting: Internal Medicine

## 2019-11-17 DIAGNOSIS — E785 Hyperlipidemia, unspecified: Secondary | ICD-10-CM | POA: Diagnosis not present

## 2019-11-17 DIAGNOSIS — I10 Essential (primary) hypertension: Secondary | ICD-10-CM | POA: Diagnosis not present

## 2019-11-18 LAB — COMPREHENSIVE METABOLIC PANEL
ALT: 17 IU/L (ref 0–32)
AST: 29 IU/L (ref 0–40)
Albumin/Globulin Ratio: 1.3 (ref 1.2–2.2)
Albumin: 4.4 g/dL (ref 3.7–4.7)
Alkaline Phosphatase: 82 IU/L (ref 39–117)
BUN/Creatinine Ratio: 22 (ref 12–28)
BUN: 20 mg/dL (ref 8–27)
Bilirubin Total: 0.4 mg/dL (ref 0.0–1.2)
CO2: 24 mmol/L (ref 20–29)
Calcium: 9.4 mg/dL (ref 8.7–10.3)
Chloride: 93 mmol/L — ABNORMAL LOW (ref 96–106)
Creatinine, Ser: 0.93 mg/dL (ref 0.57–1.00)
GFR calc Af Amer: 72 mL/min/{1.73_m2} (ref >59)
GFR calc non Af Amer: 62 mL/min/{1.73_m2} (ref >59)
Globulin, Total: 3.3 g/dL (ref 1.5–4.5)
Glucose: 192 mg/dL — ABNORMAL HIGH (ref 65–99)
Potassium: 4.2 mmol/L (ref 3.5–5.2)
Sodium: 134 mmol/L (ref 134–144)
Total Protein: 7.7 g/dL (ref 6.0–8.5)

## 2019-11-18 LAB — LIPID PANEL
Chol/HDL Ratio: 4.4 ratio (ref 0.0–4.4)
Cholesterol, Total: 188 mg/dL (ref 100–199)
HDL: 43 mg/dL (ref >39)
LDL Chol Calc (NIH): 108 mg/dL — ABNORMAL HIGH (ref 0–99)
Triglycerides: 215 mg/dL — ABNORMAL HIGH (ref 0–149)
VLDL Cholesterol Cal: 37 mg/dL (ref 5–40)

## 2019-11-20 ENCOUNTER — Other Ambulatory Visit: Payer: Self-pay | Admitting: Internal Medicine

## 2019-11-20 ENCOUNTER — Other Ambulatory Visit (HOSPITAL_COMMUNITY): Payer: Self-pay | Admitting: Nurse Practitioner

## 2019-11-24 ENCOUNTER — Other Ambulatory Visit (HOSPITAL_COMMUNITY): Payer: Self-pay | Admitting: Nurse Practitioner

## 2019-11-25 ENCOUNTER — Ambulatory Visit: Payer: PPO | Admitting: Cardiovascular Disease

## 2019-11-25 ENCOUNTER — Other Ambulatory Visit: Payer: Self-pay | Admitting: Cardiovascular Disease

## 2019-11-25 ENCOUNTER — Encounter: Payer: Self-pay | Admitting: Cardiovascular Disease

## 2019-11-25 ENCOUNTER — Other Ambulatory Visit: Payer: Self-pay

## 2019-11-25 VITALS — BP 122/60 | HR 102 | Temp 95.4°F | Ht 62.0 in | Wt 188.0 lb

## 2019-11-25 DIAGNOSIS — I10 Essential (primary) hypertension: Secondary | ICD-10-CM | POA: Diagnosis not present

## 2019-11-25 DIAGNOSIS — E669 Obesity, unspecified: Secondary | ICD-10-CM | POA: Diagnosis not present

## 2019-11-25 DIAGNOSIS — E1169 Type 2 diabetes mellitus with other specified complication: Secondary | ICD-10-CM | POA: Diagnosis not present

## 2019-11-25 DIAGNOSIS — E785 Hyperlipidemia, unspecified: Secondary | ICD-10-CM | POA: Diagnosis not present

## 2019-11-25 DIAGNOSIS — I5022 Chronic systolic (congestive) heart failure: Secondary | ICD-10-CM

## 2019-11-25 DIAGNOSIS — I700 Atherosclerosis of aorta: Secondary | ICD-10-CM

## 2019-11-25 DIAGNOSIS — I4821 Permanent atrial fibrillation: Secondary | ICD-10-CM | POA: Diagnosis not present

## 2019-11-25 DIAGNOSIS — G4761 Periodic limb movement disorder: Secondary | ICD-10-CM

## 2019-11-25 DIAGNOSIS — Z7901 Long term (current) use of anticoagulants: Secondary | ICD-10-CM

## 2019-11-25 DIAGNOSIS — I2510 Atherosclerotic heart disease of native coronary artery without angina pectoris: Secondary | ICD-10-CM | POA: Diagnosis not present

## 2019-11-25 NOTE — Patient Instructions (Signed)
Medication Instructions:  No changes *If you need a refill on your cardiac medications before your next appointment, please call your pharmacy*   Lab Work: None ordered If you have labs (blood work) drawn today and your tests are completely normal, you will receive your results only by: [name] MyChart Message (if you have MyChart) OR . A paper copy in the mail If you have any lab test that is abnormal or we need to change your treatment, we will call you to review the results.   Testing/Procedures: Your physician has requested that you have an echocardiogram. Echocardiography is a painless test that uses sound waves to create images of your heart. It provides your doctor with information about the size and shape of your heart and how well your heart's chambers and valves are working. You may receive an ultrasound enhancing agent through an IV if needed to better visualize your heart during the echo.This procedure takes approximately one hour. There are no restrictions for this procedure. This will take place at the [address], [address].     Follow-Up: At [facility], you and your health needs are our priority.  As part of our continuing mission to provide you with exceptional heart care, we have created designated Provider Care Teams.  These Care Teams include your primary Cardiologist (physician) and Advanced Practice Providers (APPs -  Physician Assistants and Nurse Practitioners) who all work together to provide you with the care you need, when you need it.  We recommend signing up for the patient portal called "MyChart".  Sign up information is provided on this After Visit Summary.  MyChart is used to connect with patients for Virtual Visits (Telemedicine).  Patients are able to view lab/test results, encounter notes, upcoming appointments, etc.  Non-urgent messages can be sent to your provider as well.   To learn more about what you can do with MyChart, go to [email or web address].     Your next appointment:   Follow up with an APP or Dr. Sallyanne Kuster after the ECHO. This can be a virtual appointment Follow up with Dr. Sallyanne Kuster in office in 3 months.

## 2019-11-25 NOTE — Progress Notes (Signed)
Cardiology Office Note:    Date:  11/27/2019   ID:  [name], DOB 02-Mar-1951, MRN [id number]  PCP:  Janice Koch, MD  Cardiologist:  New (CHF clinic, [facility]) Electrophysiologist:  None   Referring MD: [name], *   Chief Complaint  Patient presents with  . Congestive Heart Failure  . Atrial Fibrillation    History of Present Illness:    [name] is a 69 y.o. female with a hx of CAD (posterior lateral ventricular branch stent in 2009), longstanding persistent atrial fibrillation, 3, diabetes mellitus type 2, hypertension, pericardiocentesis for hemothorax with tamponade in June 2020, while on treatment with oral anticoagulant.She is a retired Electrical engineer.  She has not been doing well for the last approximately 5 weeks.  She has noticed abdominal distention and increased shortness of breath with activity, but she has not had edema.  Her weight has gone up about 5 or 6 pounds (182 pounds on her home scale, 188 pounds on our office scale).  She does not have orthopnea or PND.  She remains unaware of palpitations and does not have chest pain.  She had problems with abdominal distention during heart failure exacerbation in the past, but is uncertain whether the current problems with abdominal distention might not be due to GI problems.  She is scheduled to follow-up with Dr. Cristina Morrison already.  10/10/2019 chest x-ray did not show cardiomegaly, pleural effusions or heart failure findings.  She has also noticed that her heart rate is trending faster over the last couple of weeks and on one occasion even had a heart rate of 124 at night.  However, looking through her records, it is pretty typical for her ventricular rate to be in the 100-115 range at multiple office visits throughout late 2020.  She denies falls or serious injuries, any bleeding.  Her last echocardiogram in December 2020 showed LVEF of 45-50%.  This was a decline from June 2020 when the  EF was 60%.  She had a normal perfusion pattern on her nuclear stress tests from 2012, 2014 and 2016.  Past Medical History:  Diagnosis Date  . Anemia, unspecified   . Arthritis   . Asthma   . Atrial fibrillation with RVR ([place])    a. on Xarelto  . Bell's palsy    Facial nerve decompression in 2001  . CHF (congestive heart failure) ([place])   . Chronic low back pain   . COPD with asthma ([place])   . Coronary artery disease    Myoview 04/12/11 was entirely normal. ECHO 02/26/08 showed only minor abnormalities. Stenting 05/26/08 of her posterolateral branch to the left circumflex coronary artery. Used a 2.5x52mm Taxus Monorail stent.myoview 2014 was without ischemia  . Diabetes mellitus    Type 2  . Early cataracts, bilateral   . Fatty liver   . GERD (gastroesophageal reflux disease)   . Glaucoma   . Glaucoma   . Goiter   . Heart murmur   . History of nuclear stress test 2012; 2014   lexiscan; normal pattern of perfusion; normal, low risk scan   . Hyperlipidemia   . Hypertension   . Panic disorder   . Pneumonia 2008  . Polycystic ovary disease    Hysterectomy in 1982 for this  . Shortness of breath dyspnea    ECHO 02/26/08 showed only minor abnormalities  . Spinal stenosis     Past Surgical History:  Procedure Laterality Date  . ABDOMINAL HYSTERECTOMY  1982   &  BSO; for polycystic ovary disease  . CARDIOVERSION N/A 12/17/2013   Procedure: CARDIOVERSION;  Surgeon: Pixie Casino, MD;  Location: [facility] ENDOSCOPY;  Service: Cardiovascular;  Laterality: N/A;  . CARDIOVERSION N/A 09/30/2015   Procedure: CARDIOVERSION;  Surgeon: Dorothy Spark, MD;  Location: [facility] ENDOSCOPY;  Service: Cardiovascular;  Laterality: N/A;  . CARDIOVERSION N/A 06/22/2016   Procedure: CARDIOVERSION;  Surgeon: Skeet Latch, MD;  Location: [facility] ENDOSCOPY;  Service: Cardiovascular;  Laterality: N/A;  . CENTRAL LINE INSERTION  01/13/2019   Procedure: CENTRAL LINE INSERTION;  Surgeon: Jolaine Artist, MD;   Location: [place] CV LAB;  Service: Cardiovascular;;  . COLONOSCOPY     last 2009; Dr [name]; due 2019  . COLONOSCOPY WITH PROPOFOL N/A 01/23/2018   Procedure: COLONOSCOPY WITH PROPOFOL;  Surgeon: Ronald Lobo, MD;  Location: [place];  Service: Endoscopy;  Laterality: N/A;  . CORONARY ANGIOPLASTY  05/26/2008   Stenting of her posterolateral branch to the left circumflex coronary artery. Used a 2.5x77mm Taxus Monorail stent.  . ESOPHAGOGASTRODUODENOSCOPY (EGD) WITH PROPOFOL N/A 01/23/2018   Procedure: ESOPHAGOGASTRODUODENOSCOPY (EGD) WITH PROPOFOL;  Surgeon: Ronald Lobo, MD;  Location: [place];  Service: Endoscopy;  Laterality: N/A;  . FACIAL NERVE DECOMPRESSION  2001/2002   bells palsy   . LAPAROSCOPIC CHOLECYSTECTOMY  06/15/2011    Dr Dalbert Batman  . PERICARDIOCENTESIS N/A 01/13/2019   Procedure: PERICARDIOCENTESIS;  Surgeon: Burnell Blanks, MD;  Location: [place] CV LAB;  Service: Cardiovascular;  Laterality: N/A;  . RIGHT AND LEFT HEART CATH N/A 01/13/2019   Procedure: RIGHT AND LEFT HEART CATH;  Surgeon: Jolaine Artist, MD;  Location: [place] CV LAB;  Service: Cardiovascular;  Laterality: N/A;  . TEE WITHOUT CARDIOVERSION N/A 12/17/2013   Procedure: TRANSESOPHAGEAL ECHOCARDIOGRAM (TEE);  Surgeon: Pixie Casino, MD;  Location: [facility] ENDOSCOPY;  Service: Cardiovascular;  Laterality: N/A;  trish/ja  . TRANSTHORACIC ECHOCARDIOGRAM  123456   LV systolic function normal with mild conc LVH; LA mildly dilated; trace MR/TR  . UPPER GI ENDOSCOPY  2009   negative    Current Medications: Current Meds  Medication Sig  . ascorbic acid (VITAMIN C) 1000 MG tablet Take 1,000 mg by mouth daily.   [name] CALCIUM-MAG-VIT C-VIT D PO Take 1 tablet by mouth daily.   . celecoxib (CELEBREX) 200 MG capsule Take 200 mg by mouth 2 (two) times daily.   [name] diltiazem (CARDIZEM CD) 180 MG 24 hr capsule TAKE 1 CAPSULE DAILY.  [name] gabapentin (NEURONTIN) 100 MG capsule TAKE (1) CAPSULE THREE  TIMES DAILY. (Patient taking differently: Take 100 mg by mouth 2 (two) times daily. )  . guaiFENesin (MUCINEX) 600 MG 12 hr tablet Take 600 mg by mouth 2 (two) times daily as needed for cough.   [name] HYDROcodone-acetaminophen (NORCO/VICODIN) 5-325 MG tablet TAKE ONE TABLET AT BEDTIME.  [name] INVOKANA 100 MG TABS tablet TAKE 1 TABLET ONCE DAILY.  [name] latanoprost (XALATAN) 0.005 % ophthalmic solution Place 1 drop into both eyes at bedtime.  [name] LORazepam (ATIVAN) 0.5 MG tablet TAKE 1 OR 2 TABLETS AT BEDTIME.  . metoprolol succinate (TOPROL-XL) 50 MG 24 hr tablet Take 2 tablets (100 mg total) by mouth 2 (two) times daily. Take with or immediately following a meal. (Patient taking differently: Take 100 mg by mouth 2 (two) times daily. Take 3 tablets by mouth in the morning)  . Multiple Vitamins-Iron (MULTIVITAMINS WITH IRON) TABS Take 1 tablet by mouth daily.   [name] nystatin ointment (MYCOSTATIN) Apply 1 application topically 2 (two)  times daily as needed (IRRITATION).   [name] omeprazole (PRILOSEC) 20 MG capsule Take 1 capsule (20 mg total) by mouth 2 (two) times daily before a meal.  . PARoxetine (PAXIL) 10 MG tablet TAKE 2 AND 1/2 TABS DAILY  . potassium chloride (MICRO-K) 10 MEQ CR capsule TAKE (2) CAPSULES DAILY.  [name] rOPINIRole (REQUIP XL) 2 MG 24 hr tablet Take 1 tablet (2 mg total) by mouth at bedtime.  . senna (SENOKOT) 8.6 MG TABS Take 1 tablet by mouth at bedtime.   . sitaGLIPtin-metformin (JANUMET) 50-1000 MG tablet TAKE 1/2 TABLET IN THE MORNING WITH BREAKFAST AND 1 TABLET IN THE EVENING WITH SUPPER.  . STIOLTO RESPIMAT 2.5-2.5 MCG/ACT AERS INHALE 2 PUFFS INTO LUNGS ONCE DAILY.  [name] torsemide (DEMADEX) 20 MG tablet Take 2 tablets (40 mg total) by mouth 2 (two) times daily.  . traZODone (DESYREL) 100 MG tablet TAKE 1/2 TO 1 TABLET AT BEDTIME.  [name] XARELTO 20 MG TABS tablet TAKE 1 TABLET ONCE DAILY WITH SUPPER.  . [DISCONTINUED] metoprolol succinate (TOPROL-XL) 25 MG 24 hr tablet Take 25 mg by mouth daily.      Allergies:   Benadryl [diphenhydramine hcl], Clopidogrel bisulfate, and Contrast media [iodinated diagnostic agents]   Social History   Socioeconomic History  . Marital status: Married    Spouse name: Not on file  . Number of children: 2  . Years of education: master's  . Highest education level: Not on file  Occupational History  . Occupation: Electrical engineer    Employer: [employer]  Tobacco Use  . Smoking status: Former Smoker    Packs/day: 1.50    Years: 30.00    Pack years: 45.00    Types: Cigarettes    Quit date: 08/13/1993    Years since quitting: 26.3  . Smokeless tobacco: Never Used  Substance and Sexual Activity  . Alcohol use: No  . Drug use: No  . Sexual activity: Not on file  Other Topics Concern  . Not on file  Social History Narrative  . Not on file   Social Determinants of Health   Financial Resource Strain:   . Difficulty of Paying Living Expenses:   Food Insecurity:   . Worried About Charity fundraiser in the Last Year:   . Arboriculturist in the Last Year:   Transportation Needs:   . Film/video editor (Medical):   [name] Lack of Transportation (Non-Medical):   Physical Activity:   . Days of Exercise per Week:   . Minutes of Exercise per Session:   Stress:   . Feeling of Stress :   Social Connections:   . Frequency of Communication with Friends and Family:   . Frequency of Social Gatherings with Friends and Family:   . Attends Religious Services:   . Active Member of Clubs or Organizations:   . Attends Archivist Meetings:   [name] Marital Status:      Family History: The patient's family history includes Cancer in her brother and sister; Colon cancer (age of onset: 80) in her brother; Colon polyps in her sister; Emphysema (age of onset: 75) in her mother; Gout in her brother; Heart attack (age of onset: 17) in her father; Hyperlipidemia in her brother; Hypertension in her brother; Pneumonia in her maternal grandmother;  Ulcers in her mother. There is no history of Diabetes or Stroke.  ROS:   Please see the history of present illness.    All other systems are  reviewed and are negative.   EKGs/Labs/Other Studies Reviewed:    The following studies were reviewed today: Echocardiogram May 19, 2019  1. Left ventricular ejection fraction, by visual estimation, is 45 to 50%. The left ventricle has mildly decreased function. Left ventricular septal wall thickness was normal. There is no left ventricular hypertrophy.  2. Left ventricular diastolic Doppler parameters are indeterminate pattern of LV diastolic filling.  3. The LV endocardium is not well seen EF looks to be about 45-50% with septal dyssynergy. The inferior wall is not seen well but appears to be hypokinetic from base to midsection.  4. Global right ventricle has mildly reduced systolic function.The right ventricular size is normal. No increase in right ventricular wall thickness.  5. Left atrial size was moderately dilated.  6. Right atrial size was moderately dilated.  7. Mild mitral annular calcification.  8. The mitral valve is normal in structure. Trace mitral valve regurgitation.  9. The tricuspid valve is normal in structure. Tricuspid valve regurgitation is mild. 10. The aortic valve is normal in structure. Aortic valve regurgitation is trivial by color flow Doppler. Mild aortic valve sclerosis without stenosis. 11. The pulmonic valve was grossly normal. Pulmonic valve regurgitation is trivial by color flow Doppler. 12. The inferior vena cava is dilated in size with >50% respiratory variability, suggesting right atrial pressure of 8 mmHg  EKG:  EKG is ordered today shows atrial fibrillation with controlled ventricular rate, QTC 455 ms.  Recent Labs: 01/16/2019: Magnesium 2.1 02/16/2019: Pro B Natriuretic peptide (BNP) 443.0 04/02/2019: BNP 149.8 06/04/2019: Hemoglobin 12.0; Platelets 275 11/17/2019: ALT 17; BUN 20; Creatinine, Ser 0.93;  Potassium 4.2; Sodium 134  Recent Lipid Panel    Component Value Date/Time   CHOL 188 11/17/2019 1534   TRIG 215 (H) 11/17/2019 1534   HDL 43 11/17/2019 1534   CHOLHDL 4.4 11/17/2019 1534   CHOLHDL 2 10/02/2018 1203   VLDL 24.6 10/02/2018 1203   LDLCALC 108 (H) 11/17/2019 1534   LDLDIRECT 143.4 01/23/2008 1316    Physical Exam:    VS:  BP 122/60   Pulse (!) 102   Temp (!) 95.4 F (35.2 C)   Ht 5\' 2"  (1.575 m)   Wt 188 lb (85.3 kg)   BMI 34.39 kg/m     Wt Readings from Last 3 Encounters:  11/25/19 188 lb (85.3 kg)  11/02/19 187 lb (84.8 kg)  08/05/19 183 lb (83 kg)     General: Alert, oriented x3, no distress, moderately obese Head: no evidence of trauma, PERRL, EOMI, no exophtalmos or lid lag, no myxedema, no xanthelasma; normal ears, nose and oropharynx Neck: normal jugular venous pulsations and no hepatojugular reflux; brisk carotid pulses without delay and no carotid bruits Chest: clear to auscultation, no signs of consolidation by percussion or palpation, normal fremitus, symmetrical and full respiratory excursions Cardiovascular: normal position and quality of the apical impulse, irregular rhythm, normal first and second heart sounds, no murmurs, rubs or gallops Abdomen: no tenderness, mild distention, no masses by palpation, no abnormal pulsatility or arterial bruits, normal bowel sounds, no hepatosplenomegaly Extremities: no clubbing, cyanosis or edema; 2+ radial, ulnar and brachial pulses bilaterally; 2+ right femoral, posterior tibial and dorsalis pedis pulses; 2+ left femoral, posterior tibial and dorsalis pedis pulses; no subclavian or femoral bruits Neurological: grossly nonfocal Psych: Normal mood and affect    ASSESSMENT:    1. Permanent atrial fibrillation ([place])   2. Chronic systolic heart failure ([place])   3. Long term current use of  anticoagulant   4. Coronary artery disease involving native coronary artery of native heart without angina pectoris   5.  Essential hypertension   6. Hyperlipidemia associated with type 2 diabetes mellitus ([place])   7. Diabetes mellitus type 2 in obese ([place])   8. Periodic limb movement disorder   9. Aortic atherosclerosis ([facility])    PLAN:    In order of problems listed above:  1. AFib: Despite high-dose metoprolol and a moderate dose of diltiazem her rate control appears to be inadequate.  This might put her at risk for recurrent tachycardia-cardiomyopathy.  Will recheck an echocardiogram and might need to add digoxin (if EF is low) or increase the diltiazem (if EF is OK).  She is appropriately anticoagulated.  CHA2DS2-VASc 6 (age, gender, hypertension, diabetes, CAD, history of heart failure/LV dysfunction). 2. CHF: Weight is very slightly higher and she has abdominal distention but no overt edema and no jugular venous distention.  Most recent LVEF 45-50%, which I expected would continue improving with sustained rate control, since the mechanism appeared to be tachycardia related.  On high-dose metoprolol.  Until recently her medicine list included quinapril 40 mg daily, but this is no longer there.  I am not sure if it was stopped purposefully or inadvertently or just dropped off her list.  She is on moderate dose of loop diuretics and also takes an SGLT2 inhibitor (Invokana). 3. Anticoagulation: She had hemopericardium last year and will need to look for any evidence of constrictive changes on the echo, although this can be challenging with atrial fibrillation.  She did have iron deficiency anemia in 2020.  Her hemoglobin was up to 12 when last checked in October, and she does not appear particularly pale today. 4. CAD: She does not have angina pectoris and has not required any revascularization procedure since 2009, with normal nuclear stress test in 2012, 2014 and 2016. 5. HTN: Excellent control 6. HLP: Last year her LDL cholesterol was 42, but her most recent labs from last week show an LDL of 108.  It appears her  rosuvastatin has been stopped and I am not sure why.  Target LDL less than 70 with history of previous percutaneous revascularization for CAD. 7. DM: Well-controlled with most recent hemoglobin A1c 7% on SGLT2 inhibitor, metformin and sitagliptin. 8. Periodic limb movement syndrome: Saw Dr. Brett Fairy for sleep clinic evaluation in March 2021.  On Requip. 9. Aortic atherosclerosis: Aortic calcification with normal caliber aorta seen on chest x-ray and chest CT.    Need to ask whether her rosuvastatin and quinapril were stopped due to side effects or other reasons.  I would recommend restarting both medications barring side effects that are not aware of.   Medication Adjustments/Labs and Tests Ordered: Current medicines are reviewed at length with the patient today.  Concerns regarding medicines are outlined above.  Orders Placed This Encounter  Procedures  . EKG 12-Lead  . ECHOCARDIOGRAM COMPLETE   No orders of the defined types were placed in this encounter.   Patient Instructions  Medication Instructions:  No changes *If you need a refill on your cardiac medications before your next appointment, please call your pharmacy*   Lab Work: None ordered If you have labs (blood work) drawn today and your tests are completely normal, you will receive your results only by: [name] MyChart Message (if you have MyChart) OR . A paper copy in the mail If you have any lab test that is abnormal or we need to change your treatment,  we will call you to review the results.   Testing/Procedures: Your physician has requested that you have an echocardiogram. Echocardiography is a painless test that uses sound waves to create images of your heart. It provides your doctor with information about the size and shape of your heart and how well your heart's chambers and valves are working. You may receive an ultrasound enhancing agent through an IV if needed to better visualize your heart during the echo.This  procedure takes approximately one hour. There are no restrictions for this procedure. This will take place at the [address], [address].     Follow-Up: At [facility], you and your health needs are our priority.  As part of our continuing mission to provide you with exceptional heart care, we have created designated Provider Care Teams.  These Care Teams include your primary Cardiologist (physician) and Advanced Practice Providers (APPs -  Physician Assistants and Nurse Practitioners) who all work together to provide you with the care you need, when you need it.  We recommend signing up for the patient portal called "MyChart".  Sign up information is provided on this After Visit Summary.  MyChart is used to connect with patients for Virtual Visits (Telemedicine).  Patients are able to view lab/test results, encounter notes, upcoming appointments, etc.  Non-urgent messages can be sent to your provider as well.   To learn more about what you can do with MyChart, go to [email or web address].    Your next appointment:   Follow up with an APP or Dr. Sallyanne Kuster after the ECHO. This can be a virtual appointment Follow up with Dr. Sallyanne Kuster in office in 3 months.     Signed, Sanda Klein, MD  11/27/2019 12:20 PM    [facility]

## 2019-11-27 ENCOUNTER — Encounter: Payer: Self-pay | Admitting: Cardiovascular Disease

## 2019-11-30 ENCOUNTER — Telehealth: Payer: Self-pay | Admitting: *Deleted

## 2019-11-30 NOTE — Telephone Encounter (Signed)
Call placed to the patient to verify medications. Per Dr. Sallyanne Kuster he would like for her to restart the Rosuvastatin and Quinapril. She stated she does not remember why this was discontinued but she would be fine with restarting them .  She used to be on: Rosuvastatin 10 mg once daily  Quinapril 40 mg once daily  She also stated that she is currently taking Metoprolol Succinate 150 mg in the morning and 25 mg in the evening. When she has her afib episodes she will take an additional 25 mg in the evening. She has been advised that we had her on Metoprolol Succinate 100 mg bid. She stated that she would try that if Dr. Sallyanne Kuster felt it would be best.

## 2019-11-30 NOTE — Telephone Encounter (Signed)
Yes, let's try 100 mg BID metoprolol please. Still OK to take an additional 25 mg as needed occasionally.

## 2019-12-01 NOTE — Telephone Encounter (Signed)
Left a message for the patient to call back.  The patient should restart:  Quinapril 40 mg once daily Rosuvastatin 10 mg once daily  And then change:  Metoprolol Succinate to 100 mg bid.

## 2019-12-01 NOTE — Telephone Encounter (Signed)
Left a message for the patient to call back.  

## 2019-12-03 NOTE — Telephone Encounter (Signed)
Left a message for the patient to call back.  

## 2019-12-04 ENCOUNTER — Telehealth: Payer: Self-pay | Admitting: Internal Medicine

## 2019-12-04 NOTE — Telephone Encounter (Signed)
Message sent through MyChart per the patient request.

## 2019-12-04 NOTE — Progress Notes (Signed)
  Chronic Care Management   Outreach Note  12/04/2019 Name: [name] MRN: [id number] DOB: 08-13-1951  Referred by: Hoyt Koch, MD Reason for referral : No chief complaint on file.   A second unsuccessful telephone outreach was attempted today. The patient was referred to pharmacist for assistance with care management and care coordination.   This note is not being shared with the patient for the following reason: To respect privacy (The patient or proxy has requested that the information not be shared). Follow Up Plan:   Raynicia Dukes [employer] Scheduler

## 2019-12-06 NOTE — Telephone Encounter (Signed)
Yes, she can take metoprolol tartrate 25 mg once or twice daily as needed for rapid palpitations

## 2019-12-07 MED ORDER — QUINAPRIL HCL 40 MG PO TABS: 40.0000 mg | ORAL_TABLET | Freq: Every day | ORAL | 1 refills | Status: DC

## 2019-12-07 MED ORDER — METOPROLOL TARTRATE 25 MG PO TABS
ORAL_TABLET | ORAL | 3 refills | Status: DC
Start: 2019-12-07 — End: 2020-04-12

## 2019-12-07 MED ORDER — ROSUVASTATIN CALCIUM 10 MG PO TABS
10.0000 mg | ORAL_TABLET | Freq: Every day | ORAL | 3 refills | Status: DC
Start: 2019-12-07 — End: 2021-01-06

## 2019-12-07 MED ORDER — METOPROLOL SUCCINATE ER 100 MG PO TB24: 100.0000 mg | ORAL_TABLET | Freq: Two times a day (BID) | ORAL | 1 refills | Status: DC

## 2019-12-07 NOTE — Addendum Note (Signed)
Addended by: Ricci Barker on: 12/07/2019 08:16 AM   Modules accepted: Orders

## 2019-12-09 ENCOUNTER — Other Ambulatory Visit: Payer: Self-pay

## 2019-12-09 ENCOUNTER — Ambulatory Visit (HOSPITAL_COMMUNITY): Payer: PPO | Attending: Cardiovascular Disease

## 2019-12-09 DIAGNOSIS — I5022 Chronic systolic (congestive) heart failure: Secondary | ICD-10-CM | POA: Diagnosis not present

## 2019-12-09 MED ORDER — PERFLUTREN LIPID MICROSPHERE
1.0000 mL | INTRAVENOUS | Status: AC | PRN
  Administered 2019-12-09: 1 mL via INTRAVENOUS

## 2019-12-15 ENCOUNTER — Telehealth: Payer: PPO | Admitting: Adult Health

## 2019-12-16 DIAGNOSIS — F339 Major depressive disorder, recurrent, unspecified: Secondary | ICD-10-CM | POA: Diagnosis not present

## 2019-12-16 DIAGNOSIS — D692 Other nonthrombocytopenic purpura: Secondary | ICD-10-CM | POA: Diagnosis not present

## 2019-12-16 DIAGNOSIS — I509 Heart failure, unspecified: Secondary | ICD-10-CM | POA: Diagnosis not present

## 2019-12-16 DIAGNOSIS — E1151 Type 2 diabetes mellitus with diabetic peripheral angiopathy without gangrene: Secondary | ICD-10-CM | POA: Diagnosis not present

## 2019-12-16 DIAGNOSIS — I482 Chronic atrial fibrillation, unspecified: Secondary | ICD-10-CM | POA: Diagnosis not present

## 2019-12-16 DIAGNOSIS — J449 Chronic obstructive pulmonary disease, unspecified: Secondary | ICD-10-CM | POA: Diagnosis not present

## 2019-12-16 DIAGNOSIS — E1142 Type 2 diabetes mellitus with diabetic polyneuropathy: Secondary | ICD-10-CM | POA: Diagnosis not present

## 2019-12-16 DIAGNOSIS — D6869 Other thrombophilia: Secondary | ICD-10-CM | POA: Diagnosis not present

## 2019-12-16 DIAGNOSIS — E1122 Type 2 diabetes mellitus with diabetic chronic kidney disease: Secondary | ICD-10-CM | POA: Diagnosis not present

## 2019-12-16 DIAGNOSIS — E261 Secondary hyperaldosteronism: Secondary | ICD-10-CM | POA: Diagnosis not present

## 2019-12-16 DIAGNOSIS — I25118 Atherosclerotic heart disease of native coronary artery with other forms of angina pectoris: Secondary | ICD-10-CM | POA: Diagnosis not present

## 2019-12-16 DIAGNOSIS — E1159 Type 2 diabetes mellitus with other circulatory complications: Secondary | ICD-10-CM | POA: Diagnosis not present

## 2019-12-20 ENCOUNTER — Other Ambulatory Visit: Payer: Self-pay | Admitting: Cardiovascular Disease

## 2019-12-21 ENCOUNTER — Ambulatory Visit (INDEPENDENT_AMBULATORY_CARE_PROVIDER_SITE_OTHER): Payer: PPO

## 2019-12-21 ENCOUNTER — Encounter: Payer: Self-pay | Admitting: Internal Medicine

## 2019-12-21 ENCOUNTER — Ambulatory Visit: Payer: PPO | Admitting: Internal Medicine

## 2019-12-21 ENCOUNTER — Other Ambulatory Visit: Payer: Self-pay

## 2019-12-21 VITALS — BP 120/64 | HR 143 | Temp 98.0°F | Ht 62.0 in | Wt 190.0 lb

## 2019-12-21 DIAGNOSIS — R0602 Shortness of breath: Secondary | ICD-10-CM | POA: Diagnosis not present

## 2019-12-21 DIAGNOSIS — I700 Atherosclerosis of aorta: Secondary | ICD-10-CM | POA: Diagnosis not present

## 2019-12-21 DIAGNOSIS — R0600 Dyspnea, unspecified: Secondary | ICD-10-CM

## 2019-12-21 DIAGNOSIS — I509 Heart failure, unspecified: Secondary | ICD-10-CM | POA: Diagnosis not present

## 2019-12-21 DIAGNOSIS — I5033 Acute on chronic diastolic (congestive) heart failure: Secondary | ICD-10-CM

## 2019-12-21 DIAGNOSIS — J449 Chronic obstructive pulmonary disease, unspecified: Secondary | ICD-10-CM

## 2019-12-21 DIAGNOSIS — R0609 Other forms of dyspnea: Secondary | ICD-10-CM

## 2019-12-21 DIAGNOSIS — E669 Obesity, unspecified: Secondary | ICD-10-CM

## 2019-12-21 NOTE — Patient Instructions (Signed)
Order- Walk test- O2 qualifying   Dx Dyspnea on exertion  Order- lab- CBC w diff   Dx Dyspnea on exertion  Order- CXR   Dx Chronic CHF, Dyspnea on exertion  Order- overnight oximetry    Dx Dyspnea on exertion  Please call if we can help

## 2019-12-21 NOTE — Progress Notes (Signed)
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 Echo 11/20/2018- EF 55-60%, mild RAE Walk Test 12/21/19- room air- lowest O2 sat 97%, max HR 126. NPSG 08/15/2017- AHI 0/ hr. desat to 89%/ mean 93%, PLMA 23.7/ hr, body weight 194 lbs ------------------------------------------------------------------------------  11/27/2018- 79 yoF former smoker asking consultation, concerned she might have DVT. Medical problem list includes DM2/ neuropathy, AFi b, HBP, CAD/PCI, COPD, GERD, Hyperlipidemia, Restless Legs, Obesity, Glaucoma -----having SHOB, redness bilateral lower legs, started in last 8 months, left lower leg has knot below the knee  Aware of increased dyspnea over 5-6 months with no acute event. Dr Linna Darner served as PCP and pulmonologist many years ago. Legs feel weak walking without definite claudication. Persistent ankle edema with more redness in lower legs recent months. Little cough or wheeze. In chronic AFIB/ Xarelto- failed cardioversion. In last few days has noted tender pretibial nodule distal to L patella "may have hit it on something". Echo 11/20/2018- EF 55-60%, mild RAE CXR 12/04/17- IMPRESSION: Aortic atherosclerosis.  No edema or consolidation NPSG 08/15/2017- AHI 0/ hr. 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 ----f/u SOB Body weight today 190 lbs Had 2 Phizer Covax. More fluid retention x 2-3 months.  HR on arrival today 143- in AFib. More fluid retention in last 3  months. Last Echo EF 50-55%. Persistent DOE. In last week has coughed up some thick clear mucus. No blood or fever. Using Stiolto 2.5 2 puffs daily.  Walk Test 12/21/19- room air- lowest O2 sat 97%, max HR 126. CXR 10/10/19-  The heart size appears improved from prior study. Aortic calcifications are noted. There is some atelectasis versus scarring at the lung bases. There is no pneumothorax. No large pleural effusion. No acute osseous abnormality. IMPRESSION: No active cardiopulmonary disease.  // Need PFT or Spirometry//  ROS-see HPI   + = positive Constitutional:    weight loss+ last year, night sweats, fevers, chills, fatigue, lassitude. HEENT:    headaches, difficulty swallowing, tooth/dental problems, sore throat,       sneezing, itching, ear ache, nasal congestion, post nasal drip, snoring CV:    chest pain, orthopnea, PND, +swelling in lower extremities, anasarca,                                  dizziness, +palpitations Resp:   +shortness of breath with exertion or at rest.                +productive cough,   non-productive cough, coughing up of blood.              change in color of mucus.  wheezing.   Skin:    rash or lesions. GI:  No-   heartburn, indigestion, abdominal pain, nausea, vomiting, diarrhea,                 change in bowel habits, lo+ss of appetite GU: dysuria, change in color of urine, no urgency or frequency.   flank  pain. MS:   joint pain, stiffness, decreased range of motion, back pain. Neuro-     nothing unusual Psych:  change in mood or affect.  depression or anxiety.   memory loss.  OBJ- Physical Exam General- Alert, Oriented, Affect-appropriate, Distress- none acute, obese+ Skin- rash-none, lesions- none, excoriation- none Lymphadenopathy- none Head- atraumatic            Eyes- Gross vision intact, PERRLA, conjunctivae and secretions clear            Ears- Hearing, canals-normal            Nose- Clear, no-Septal dev, mucus, polyps, erosion, perforation              Throat- Mallampati II , mucosa clear , drainage- none, tonsils- atrophic Neck- flexible , trachea midline, no stridor , thyroid nl, carotid no bruit Chest - symmetrical excursion , unlabored           Heart/CV- IRR/AFib+ , no murmur , no gallop  , no rub, nl s1 s2                           - JVD- none , edema- none, stasis changes+, varices-+ superficial           Lung- clear to P&A, wheeze- none, cough- none , dullness-none, rub- none           Chest wall-  Abd-  Br/ Gen/ Rectal- Not done, not indicated Extrem- cyanosis- none, clubbing, none, atrophy- none, strength- nl Neuro- grossly intact to observation

## 2019-12-23 ENCOUNTER — Other Ambulatory Visit (INDEPENDENT_AMBULATORY_CARE_PROVIDER_SITE_OTHER): Payer: PPO

## 2019-12-23 DIAGNOSIS — I5033 Acute on chronic diastolic (congestive) heart failure: Secondary | ICD-10-CM | POA: Diagnosis not present

## 2019-12-23 DIAGNOSIS — R0600 Dyspnea, unspecified: Secondary | ICD-10-CM

## 2019-12-23 DIAGNOSIS — R0609 Other forms of dyspnea: Secondary | ICD-10-CM

## 2019-12-23 LAB — CBC WITH DIFFERENTIAL/PLATELET
Basophils Absolute: 0.1 10*3/uL (ref 0.0–0.1)
Basophils Relative: 0.9 % (ref 0.0–3.0)
Eosinophils Absolute: 0.2 10*3/uL (ref 0.0–0.7)
Eosinophils Relative: 3 % (ref 0.0–5.0)
HCT: 33.8 % — ABNORMAL LOW (ref 36.0–46.0)
Hemoglobin: 11.1 g/dL — ABNORMAL LOW (ref 12.0–15.0)
Lymphocytes Relative: 12.5 % (ref 12.0–46.0)
Lymphs Abs: 1 10*3/uL (ref 0.7–4.0)
MCHC: 32.9 g/dL (ref 30.0–36.0)
MCV: 75 fl — ABNORMAL LOW (ref 78.0–100.0)
Monocytes Absolute: 0.9 10*3/uL (ref 0.1–1.0)
Monocytes Relative: 11.2 % (ref 3.0–12.0)
Neutro Abs: 5.9 10*3/uL (ref 1.4–7.7)
Neutrophils Relative %: 72.4 % (ref 43.0–77.0)
Platelets: 289 10*3/uL (ref 150.0–400.0)
RBC: 4.51 Mil/uL (ref 3.87–5.11)
RDW: 15.3 % (ref 11.5–15.5)
WBC: 8.2 10*3/uL (ref 4.0–10.5)

## 2019-12-28 ENCOUNTER — Other Ambulatory Visit: Payer: Self-pay | Admitting: Internal Medicine

## 2019-12-30 ENCOUNTER — Encounter: Payer: Self-pay | Admitting: Internal Medicine

## 2019-12-31 MED ORDER — HYDROCODONE-ACETAMINOPHEN 5-325 MG PO TABS: 1.0000 | ORAL_TABLET | Freq: Every day | ORAL | 0 refills | Status: DC

## 2019-12-31 NOTE — Progress Notes (Signed)
Spoke with pt. Regarding her lab work and she verbalized understanding.

## 2019-12-31 NOTE — Progress Notes (Signed)
Spoke with pt regarding her CXR.  She verbalized understanding.

## 2020-01-05 ENCOUNTER — Telehealth: Payer: Self-pay | Admitting: Internal Medicine

## 2020-01-05 NOTE — Assessment & Plan Note (Addendum)
In AFib at this visit, apparently permanent now. She didn't desaturate on walk test today and doesn't wheeze, but does notice when she retains fluid.  Plan- PFT when we can schedule. Continue Stiolto. CBC to exclude current anemia, overnight oximetry

## 2020-01-05 NOTE — Assessment & Plan Note (Signed)
Most of her dyspnea is likely cardiogenic and deconditioning, but we need to plan on PFT when able.

## 2020-01-05 NOTE — Telephone Encounter (Signed)
Okay to send in new glucometer rx

## 2020-01-05 NOTE — Telephone Encounter (Signed)
New message:   Pt is needing a new prescription for a new glucometer. Please advise. [place], [place]

## 2020-01-05 NOTE — Assessment & Plan Note (Signed)
Exercise tolerance is limited. Needs to be careful with diet.

## 2020-01-06 ENCOUNTER — Encounter: Payer: Self-pay | Admitting: Internal Medicine

## 2020-01-06 ENCOUNTER — Other Ambulatory Visit: Payer: Self-pay

## 2020-01-06 MED ORDER — FREESTYLE LIBRE SENSOR SYSTEM MISC: 0 refills | Status: DC

## 2020-01-06 NOTE — Telephone Encounter (Signed)
Prescription for new glucometer sent into [facility]

## 2020-01-07 ENCOUNTER — Ambulatory Visit: Payer: PPO | Admitting: Internal Medicine

## 2020-01-08 ENCOUNTER — Other Ambulatory Visit: Payer: Self-pay

## 2020-01-08 ENCOUNTER — Encounter: Payer: Self-pay | Admitting: Internal Medicine

## 2020-01-08 ENCOUNTER — Other Ambulatory Visit (HOSPITAL_COMMUNITY): Payer: Self-pay | Admitting: Internal Medicine

## 2020-01-08 ENCOUNTER — Ambulatory Visit (INDEPENDENT_AMBULATORY_CARE_PROVIDER_SITE_OTHER): Payer: PPO | Admitting: Internal Medicine

## 2020-01-08 VITALS — BP 130/70 | HR 76 | Temp 98.6°F | Ht 61.0 in | Wt 191.8 lb

## 2020-01-08 DIAGNOSIS — R0602 Shortness of breath: Secondary | ICD-10-CM

## 2020-01-08 DIAGNOSIS — G4452 New daily persistent headache (NDPH): Secondary | ICD-10-CM

## 2020-01-08 DIAGNOSIS — D500 Iron deficiency anemia secondary to blood loss (chronic): Secondary | ICD-10-CM | POA: Diagnosis not present

## 2020-01-08 DIAGNOSIS — E1159 Type 2 diabetes mellitus with other circulatory complications: Secondary | ICD-10-CM

## 2020-01-08 DIAGNOSIS — I509 Heart failure, unspecified: Secondary | ICD-10-CM | POA: Diagnosis not present

## 2020-01-08 LAB — VITAMIN B12: Vitamin B-12: 496 pg/mL (ref 211–911)

## 2020-01-08 LAB — COMPREHENSIVE METABOLIC PANEL
ALT: 15 U/L (ref 0–35)
AST: 27 U/L (ref 0–37)
Albumin: 4.3 g/dL (ref 3.5–5.2)
Alkaline Phosphatase: 62 U/L (ref 39–117)
BUN: 24 mg/dL — ABNORMAL HIGH (ref 6–23)
CO2: 28 mEq/L (ref 19–32)
Calcium: 9.2 mg/dL (ref 8.4–10.5)
Chloride: 86 mEq/L — ABNORMAL LOW (ref 96–112)
Creatinine, Ser: 1.17 mg/dL (ref 0.40–1.20)
GFR: 45.57 mL/min — ABNORMAL LOW (ref >60.00)
Glucose, Bld: 170 mg/dL — ABNORMAL HIGH (ref 70–99)
Potassium: 4.4 mEq/L (ref 3.5–5.1)
Sodium: 122 mEq/L — ABNORMAL LOW (ref 135–145)
Total Bilirubin: 0.5 mg/dL (ref 0.2–1.2)
Total Protein: 7.4 g/dL (ref 6.0–8.3)

## 2020-01-08 LAB — CBC
HCT: 28.9 % — ABNORMAL LOW (ref 36.0–46.0)
Hemoglobin: 9.6 g/dL — ABNORMAL LOW (ref 12.0–15.0)
MCHC: 33.3 g/dL (ref 30.0–36.0)
MCV: 73.7 fl — ABNORMAL LOW (ref 78.0–100.0)
Platelets: 223 10*3/uL (ref 150.0–400.0)
RBC: 3.92 Mil/uL (ref 3.87–5.11)
RDW: 15.8 % — ABNORMAL HIGH (ref 11.5–15.5)
WBC: 8.8 10*3/uL (ref 4.0–10.5)

## 2020-01-08 LAB — VITAMIN D 25 HYDROXY (VIT D DEFICIENCY, FRACTURES): VITD: 43.95 ng/mL (ref 30.00–100.00)

## 2020-01-08 LAB — FERRITIN: Ferritin: 17.2 ng/mL (ref 10.0–291.0)

## 2020-01-08 LAB — CK: Total CK: 77 U/L (ref 7–177)

## 2020-01-08 LAB — BRAIN NATRIURETIC PEPTIDE: Pro B Natriuretic peptide (BNP): 220 pg/mL — ABNORMAL HIGH (ref 0.0–100.0)

## 2020-01-08 LAB — HEMOGLOBIN A1C: Hgb A1c MFr Bld: 7.9 % — ABNORMAL HIGH (ref 4.6–6.5)

## 2020-01-08 LAB — TSH: TSH: 1.81 u[IU]/mL (ref 0.35–4.50)

## 2020-01-08 NOTE — Patient Instructions (Addendum)
We will get a CT scan of the head for the headaches.  We are checking the labs today.

## 2020-01-08 NOTE — Assessment & Plan Note (Signed)
No true etiology although PT will be able to clarify if deconditioning is playing a role.

## 2020-01-08 NOTE — Assessment & Plan Note (Signed)
Diet controlled currently. Checking HgA1c today with weight gain could be up slightly.

## 2020-01-08 NOTE — Assessment & Plan Note (Signed)
Needs home PT to help with endurance and mobility. Keep lasix dosing same. No clear flare today. Checking BNP.

## 2020-01-08 NOTE — Progress Notes (Signed)
   Subjective:   Patient ID: [name], female    DOB: May 11, 1951, 69 y.o.   MRN: [id number]  HPI The patient is a 69 YO female coming in for concerns about bruising (fall with bruise significant and lumpy area on the right arm, fell off couch a couple weeks ago, has had headache since that time which worries her, denies vision change) and diastolic CHF (recent CXR without fluid but she is up about 10 pounds, thinks eating may have to do with this, SOB on exertion only, can walk only 30 feet, no SOB on lying flat, walk test at pulmonary showed normal O2 levels with exertion, wants to try PT again at home to help with endurance) and diabetes (worried with weight gain of almost 10 pounds recently, does not want to have to go back on medicines for this, denies excessive thirst or urination recently).   Review of Systems  Constitutional: Positive for activity change, appetite change and fatigue.  HENT: Negative.   Eyes: Negative.   Respiratory: Positive for cough and shortness of breath. Negative for chest tightness.   Cardiovascular: Positive for chest pain. Negative for palpitations and leg swelling.  Gastrointestinal: Negative for abdominal distention, abdominal pain, constipation, diarrhea, nausea and vomiting.  Musculoskeletal: Positive for arthralgias and myalgias.  Skin: Negative.   Neurological: Positive for weakness.  Psychiatric/Behavioral: Negative.     Objective:  Physical Exam Constitutional:      Appearance: She is well-developed. She is obese.  HENT:     Head: Normocephalic and atraumatic.  Cardiovascular:     Rate and Rhythm: Normal rate. Rhythm irregular.  Pulmonary:     Effort: Pulmonary effort is normal. No respiratory distress.     Breath sounds: Normal breath sounds. No wheezing or rales.     Comments: Pain in the midline sternum with position more than palpation Chest:     Chest wall: Tenderness present.  Abdominal:     General: Bowel sounds are normal. There  is no distension.     Palpations: Abdomen is soft.     Tenderness: There is no abdominal tenderness. There is no rebound.  Musculoskeletal:        General: Tenderness present.     Cervical back: Normal range of motion.     Comments: No edema LE bilaterally  Skin:    General: Skin is warm and dry.  Neurological:     Mental Status: She is alert and oriented to person, place, and time.     Coordination: Coordination normal.     Comments: Wheelchair for long distances, able to walk 100 feet back to room but exhausted afterwards     Vitals:   01/08/20 1513  BP: 130/70  Pulse: 76  Temp: 98.6 F (37 C)  SpO2: 98%  Weight: 191 lb 12.8 oz (87 kg)  Height: 5\' 1"  (1.549 m)    This visit occurred during the SARS-CoV-2 public health emergency.  Safety protocols were in place, including screening questions prior to the visit, additional usage of staff PPE, and extensive cleaning of exam room while observing appropriate contact time as indicated for disinfecting solutions.   Assessment & Plan:

## 2020-01-08 NOTE — Assessment & Plan Note (Signed)
Checking ferritin and B12, recent Hg decreasing from prior. Has had significant bleed in the past. Checking CBC for stability from 2 weeks ago.

## 2020-01-08 NOTE — Assessment & Plan Note (Signed)
Checking CT head as she has had daily persistent headache since fall with injury to head (no LOC) about 2 weeks ago. Pupils equal and reactive on exam today.

## 2020-01-12 ENCOUNTER — Encounter: Payer: Self-pay | Admitting: Internal Medicine

## 2020-01-19 ENCOUNTER — Encounter: Payer: Self-pay | Admitting: Internal Medicine

## 2020-01-19 ENCOUNTER — Other Ambulatory Visit: Payer: Self-pay | Admitting: Internal Medicine

## 2020-01-21 NOTE — Telephone Encounter (Signed)
Dr Annamaria Boots, Patient sent this message you this morning.   Good Evening I need to postpone the oxygen at home test. They called and said the equipment will come this Friday. Please let them know later in June is my choice.  Additional, since I passed all of the walking test and chest x-ray was clear, explain to me Why I need to have the home test. Thank you for your attention to this request.  [name]   Message routed to Dr Annamaria Boots  Allergies  Allergen Reactions  . Benadryl [Diphenhydramine Hcl] Other (See Comments)    Restless leg  . Clopidogrel Bisulfate Nausea Only    Nausea & pain  . Contrast Media [Iodinated Diagnostic Agents] Palpitations    Rapid heart rate, hot   Current Outpatient Medications on File Prior to Visit  Medication Sig Dispense Refill  . ascorbic acid (VITAMIN C) 1000 MG tablet Take 1,000 mg by mouth daily.     [name] CALCIUM-MAG-VIT C-VIT D PO Take 1 tablet by mouth daily.     . celecoxib (CELEBREX) 200 MG capsule Take 200 mg by mouth 2 (two) times daily.     . Continuous Blood Gluc Sensor (FREESTYLE LIBRE SENSOR SYSTEM) MISC Use to monitor sugars 1 each 0  . diltiazem (CARDIZEM CD) 180 MG 24 hr capsule TAKE 1 CAPSULE DAILY. 90 capsule 3  . gabapentin (NEURONTIN) 100 MG capsule TAKE (1) CAPSULE THREE TIMES DAILY. (Patient taking differently: Take 100 mg by mouth 2 (two) times daily. ) 180 capsule 0  . guaiFENesin (MUCINEX) 600 MG 12 hr tablet Take 600 mg by mouth 2 (two) times daily as needed for cough.     [name] HYDROcodone-acetaminophen (NORCO/VICODIN) 5-325 MG tablet Take 1 tablet by mouth at bedtime. 30 tablet 0  . INVOKANA 100 MG TABS tablet TAKE 1 TABLET ONCE DAILY. 30 tablet 5  . latanoprost (XALATAN) 0.005 % ophthalmic solution Place 1 drop into both eyes at bedtime.    [name] LORazepam (ATIVAN) 0.5 MG tablet TAKE 1 OR 2 TABLETS AT BEDTIME. 60 tablet 5  . metoprolol succinate (TOPROL-XL) 100 MG 24 hr tablet Take 1 tablet (100 mg total) by mouth 2 (two) times daily.  Take with or immediately following a meal. 180 tablet 1  . metoprolol tartrate (LOPRESSOR) 25 MG tablet Take one tablet daily as needed for rapid palpitations. 30 tablet 3  . Multiple Vitamins-Iron (MULTIVITAMINS WITH IRON) TABS Take 1 tablet by mouth daily.     [name] nystatin ointment (MYCOSTATIN) Apply 1 application topically 2 (two) times daily as needed (IRRITATION).   0  . omeprazole (PRILOSEC) 20 MG capsule Take 1 capsule (20 mg total) by mouth 2 (two) times daily before a meal.    . ONETOUCH DELICA LANCETS FINE MISC 1 Units by Does not apply route as directed. 100 each 3  . PARoxetine (PAXIL) 10 MG tablet TAKE 2 AND 1/2 TABS DAILY 225 tablet 0  . potassium chloride (MICRO-K) 10 MEQ CR capsule TAKE (2) CAPSULES DAILY. 60 capsule 8  . quinapril (ACCUPRIL) 40 MG tablet Take 1 tablet (40 mg total) by mouth daily. 90 tablet 1  . rOPINIRole (REQUIP XL) 2 MG 24 hr tablet Take 1 tablet (2 mg total) by mouth at bedtime. 30 tablet 5  . rosuvastatin (CRESTOR) 10 MG tablet Take 1 tablet (10 mg total) by mouth daily. 90 tablet 3  . senna (SENOKOT) 8.6 MG TABS Take 1 tablet by mouth at bedtime.     . sitaGLIPtin-metformin (JANUMET)  50-1000 MG tablet TAKE 1/2 TABLET IN THE MORNING WITH BREAKFAST AND 1 TABLET IN THE EVENING WITH SUPPER. 45 tablet 11  . STIOLTO RESPIMAT 2.5-2.5 MCG/ACT AERS INHALE 2 PUFFS INTO LUNGS ONCE DAILY. 4 g 5  . torsemide (DEMADEX) 20 MG tablet Take 2 tablets (40 mg total) by mouth 2 (two) times daily. 360 tablet 3  . traZODone (DESYREL) 100 MG tablet TAKE 1/2 TO 1 TABLET AT BEDTIME. 90 tablet 3  . XARELTO 20 MG TABS tablet TAKE 1 TABLET ONCE DAILY WITH SUPPER. 90 tablet 1   No current facility-administered medications on file prior to visit.

## 2020-01-21 NOTE — Telephone Encounter (Signed)
Low oxygen levels in sleep are not noted by the sleeper, but are an aggravating factor contributing to atrial fibrillation. That's why I recommended we check an overnight oximetry.

## 2020-01-22 ENCOUNTER — Telehealth: Payer: Self-pay | Admitting: Internal Medicine

## 2020-01-22 NOTE — Progress Notes (Signed)
  Chronic Care Management   Outreach Note  01/22/2020 Name: EMANII BUGBEE MRN: [id number] DOB: 01-13-1951  Referred by: Hoyt Koch, MD Reason for referral : No chief complaint on file.   An unsuccessful telephone outreach was attempted today. The patient was referred to the pharmacist for assistance with care management and care coordination.   This note is not being shared with the patient for the following reason: To respect privacy (The patient or proxy has requested that the information not be shared).  Follow Up Plan:   Earney [place] [facility] Scheduler

## 2020-01-27 ENCOUNTER — Telehealth: Payer: Self-pay

## 2020-01-27 DIAGNOSIS — G4700 Insomnia, unspecified: Secondary | ICD-10-CM | POA: Diagnosis not present

## 2020-01-27 DIAGNOSIS — F329 Major depressive disorder, single episode, unspecified: Secondary | ICD-10-CM | POA: Diagnosis not present

## 2020-01-27 DIAGNOSIS — M5416 Radiculopathy, lumbar region: Secondary | ICD-10-CM | POA: Diagnosis not present

## 2020-01-27 DIAGNOSIS — I2510 Atherosclerotic heart disease of native coronary artery without angina pectoris: Secondary | ICD-10-CM | POA: Diagnosis not present

## 2020-01-27 DIAGNOSIS — I110 Hypertensive heart disease with heart failure: Secondary | ICD-10-CM | POA: Diagnosis not present

## 2020-01-27 DIAGNOSIS — G8929 Other chronic pain: Secondary | ICD-10-CM | POA: Diagnosis not present

## 2020-01-27 DIAGNOSIS — E1142 Type 2 diabetes mellitus with diabetic polyneuropathy: Secondary | ICD-10-CM | POA: Diagnosis not present

## 2020-01-27 DIAGNOSIS — M419 Scoliosis, unspecified: Secondary | ICD-10-CM | POA: Diagnosis not present

## 2020-01-27 DIAGNOSIS — Z7901 Long term (current) use of anticoagulants: Secondary | ICD-10-CM | POA: Diagnosis not present

## 2020-01-27 DIAGNOSIS — G2581 Restless legs syndrome: Secondary | ICD-10-CM | POA: Diagnosis not present

## 2020-01-27 DIAGNOSIS — D378 Neoplasm of uncertain behavior of other specified digestive organs: Secondary | ICD-10-CM | POA: Diagnosis not present

## 2020-01-27 DIAGNOSIS — D500 Iron deficiency anemia secondary to blood loss (chronic): Secondary | ICD-10-CM | POA: Diagnosis not present

## 2020-01-27 DIAGNOSIS — H409 Unspecified glaucoma: Secondary | ICD-10-CM | POA: Diagnosis not present

## 2020-01-27 DIAGNOSIS — I4821 Permanent atrial fibrillation: Secondary | ICD-10-CM | POA: Diagnosis not present

## 2020-01-27 DIAGNOSIS — M722 Plantar fascial fibromatosis: Secondary | ICD-10-CM | POA: Diagnosis not present

## 2020-01-27 DIAGNOSIS — E785 Hyperlipidemia, unspecified: Secondary | ICD-10-CM | POA: Diagnosis not present

## 2020-01-27 DIAGNOSIS — I5030 Unspecified diastolic (congestive) heart failure: Secondary | ICD-10-CM | POA: Diagnosis not present

## 2020-01-27 DIAGNOSIS — K219 Gastro-esophageal reflux disease without esophagitis: Secondary | ICD-10-CM | POA: Diagnosis not present

## 2020-01-27 DIAGNOSIS — M8580 Other specified disorders of bone density and structure, unspecified site: Secondary | ICD-10-CM | POA: Diagnosis not present

## 2020-01-27 DIAGNOSIS — Z7984 Long term (current) use of oral hypoglycemic drugs: Secondary | ICD-10-CM | POA: Diagnosis not present

## 2020-01-27 DIAGNOSIS — J449 Chronic obstructive pulmonary disease, unspecified: Secondary | ICD-10-CM | POA: Diagnosis not present

## 2020-01-27 DIAGNOSIS — E1169 Type 2 diabetes mellitus with other specified complication: Secondary | ICD-10-CM | POA: Diagnosis not present

## 2020-01-27 DIAGNOSIS — E662 Morbid (severe) obesity with alveolar hypoventilation: Secondary | ICD-10-CM | POA: Diagnosis not present

## 2020-01-27 DIAGNOSIS — M1990 Unspecified osteoarthritis, unspecified site: Secondary | ICD-10-CM | POA: Diagnosis not present

## 2020-01-27 DIAGNOSIS — R32 Unspecified urinary incontinence: Secondary | ICD-10-CM | POA: Diagnosis not present

## 2020-01-27 NOTE — Telephone Encounter (Signed)
New message    The patient called and voiced that someone called her regarding her iron infusion.   The patient is informed only notation is from the pharmacy schedulers trying to reach her the patient voiced it was her assistance.   The patient aware the CMA is in the clinic with the MD seeing patients.   Wants to know the time choices for an iron infusion.

## 2020-01-27 NOTE — Telephone Encounter (Signed)
Called pt, LVM stating that the call she received was not from this office and to double check the call back number so she can get her infusion scheduled.

## 2020-02-03 DIAGNOSIS — G8929 Other chronic pain: Secondary | ICD-10-CM

## 2020-02-03 DIAGNOSIS — E1169 Type 2 diabetes mellitus with other specified complication: Secondary | ICD-10-CM | POA: Diagnosis not present

## 2020-02-03 DIAGNOSIS — J449 Chronic obstructive pulmonary disease, unspecified: Secondary | ICD-10-CM

## 2020-02-03 DIAGNOSIS — D500 Iron deficiency anemia secondary to blood loss (chronic): Secondary | ICD-10-CM

## 2020-02-03 DIAGNOSIS — M5416 Radiculopathy, lumbar region: Secondary | ICD-10-CM | POA: Diagnosis not present

## 2020-02-03 DIAGNOSIS — E1142 Type 2 diabetes mellitus with diabetic polyneuropathy: Secondary | ICD-10-CM | POA: Diagnosis not present

## 2020-02-03 DIAGNOSIS — I4821 Permanent atrial fibrillation: Secondary | ICD-10-CM | POA: Diagnosis not present

## 2020-02-03 DIAGNOSIS — Z87891 Personal history of nicotine dependence: Secondary | ICD-10-CM

## 2020-02-03 DIAGNOSIS — Z6833 Body mass index (BMI) 33.0-33.9, adult: Secondary | ICD-10-CM

## 2020-02-03 DIAGNOSIS — Z7984 Long term (current) use of oral hypoglycemic drugs: Secondary | ICD-10-CM

## 2020-02-03 DIAGNOSIS — Z791 Long term (current) use of non-steroidal anti-inflammatories (NSAID): Secondary | ICD-10-CM

## 2020-02-03 DIAGNOSIS — I2510 Atherosclerotic heart disease of native coronary artery without angina pectoris: Secondary | ICD-10-CM

## 2020-02-03 DIAGNOSIS — D378 Neoplasm of uncertain behavior of other specified digestive organs: Secondary | ICD-10-CM

## 2020-02-03 DIAGNOSIS — I5030 Unspecified diastolic (congestive) heart failure: Secondary | ICD-10-CM | POA: Diagnosis not present

## 2020-02-03 DIAGNOSIS — F329 Major depressive disorder, single episode, unspecified: Secondary | ICD-10-CM

## 2020-02-03 DIAGNOSIS — M419 Scoliosis, unspecified: Secondary | ICD-10-CM | POA: Diagnosis not present

## 2020-02-03 DIAGNOSIS — E785 Hyperlipidemia, unspecified: Secondary | ICD-10-CM

## 2020-02-03 DIAGNOSIS — M1990 Unspecified osteoarthritis, unspecified site: Secondary | ICD-10-CM | POA: Diagnosis not present

## 2020-02-03 DIAGNOSIS — K219 Gastro-esophageal reflux disease without esophagitis: Secondary | ICD-10-CM

## 2020-02-03 DIAGNOSIS — M8580 Other specified disorders of bone density and structure, unspecified site: Secondary | ICD-10-CM

## 2020-02-03 DIAGNOSIS — R32 Unspecified urinary incontinence: Secondary | ICD-10-CM

## 2020-02-03 DIAGNOSIS — Z86718 Personal history of other venous thrombosis and embolism: Secondary | ICD-10-CM

## 2020-02-03 DIAGNOSIS — Z7901 Long term (current) use of anticoagulants: Secondary | ICD-10-CM

## 2020-02-03 DIAGNOSIS — I110 Hypertensive heart disease with heart failure: Secondary | ICD-10-CM | POA: Diagnosis not present

## 2020-02-03 DIAGNOSIS — E662 Morbid (severe) obesity with alveolar hypoventilation: Secondary | ICD-10-CM

## 2020-02-03 DIAGNOSIS — M728 Other fibroblastic disorders: Secondary | ICD-10-CM | POA: Diagnosis not present

## 2020-02-03 DIAGNOSIS — H409 Unspecified glaucoma: Secondary | ICD-10-CM

## 2020-02-03 DIAGNOSIS — G2581 Restless legs syndrome: Secondary | ICD-10-CM

## 2020-02-03 DIAGNOSIS — Z9861 Coronary angioplasty status: Secondary | ICD-10-CM

## 2020-02-03 DIAGNOSIS — Z9181 History of falling: Secondary | ICD-10-CM

## 2020-02-03 DIAGNOSIS — G4700 Insomnia, unspecified: Secondary | ICD-10-CM

## 2020-02-05 ENCOUNTER — Encounter: Payer: Self-pay | Admitting: Internal Medicine

## 2020-02-10 ENCOUNTER — Encounter: Payer: Self-pay | Admitting: Internal Medicine

## 2020-02-10 ENCOUNTER — Other Ambulatory Visit: Payer: Self-pay | Admitting: Internal Medicine

## 2020-02-10 DIAGNOSIS — R197 Diarrhea, unspecified: Secondary | ICD-10-CM | POA: Diagnosis not present

## 2020-02-10 DIAGNOSIS — D500 Iron deficiency anemia secondary to blood loss (chronic): Secondary | ICD-10-CM

## 2020-02-10 DIAGNOSIS — R1010 Upper abdominal pain, unspecified: Secondary | ICD-10-CM | POA: Diagnosis not present

## 2020-02-10 NOTE — Telephone Encounter (Signed)
12/31/2019 Hydrocodone-Acetamin 5-325 Mg 30#  Last ov 01/08/20 Next ov n/s

## 2020-02-11 MED ORDER — HYDROCODONE-ACETAMINOPHEN 5-325 MG PO TABS: 1.0000 | ORAL_TABLET | Freq: Every day | ORAL | 0 refills | Status: DC

## 2020-02-11 NOTE — Addendum Note (Signed)
Addended by: Pricilla Holm A on: 02/11/2020 08:12 AM   Modules accepted: Orders

## 2020-02-12 DIAGNOSIS — M419 Scoliosis, unspecified: Secondary | ICD-10-CM | POA: Diagnosis not present

## 2020-02-12 DIAGNOSIS — Z7984 Long term (current) use of oral hypoglycemic drugs: Secondary | ICD-10-CM | POA: Diagnosis not present

## 2020-02-12 DIAGNOSIS — J449 Chronic obstructive pulmonary disease, unspecified: Secondary | ICD-10-CM | POA: Diagnosis not present

## 2020-02-12 DIAGNOSIS — K219 Gastro-esophageal reflux disease without esophagitis: Secondary | ICD-10-CM | POA: Diagnosis not present

## 2020-02-12 DIAGNOSIS — I110 Hypertensive heart disease with heart failure: Secondary | ICD-10-CM | POA: Diagnosis not present

## 2020-02-12 DIAGNOSIS — M1990 Unspecified osteoarthritis, unspecified site: Secondary | ICD-10-CM | POA: Diagnosis not present

## 2020-02-12 DIAGNOSIS — M722 Plantar fascial fibromatosis: Secondary | ICD-10-CM | POA: Diagnosis not present

## 2020-02-12 DIAGNOSIS — E1142 Type 2 diabetes mellitus with diabetic polyneuropathy: Secondary | ICD-10-CM | POA: Diagnosis not present

## 2020-02-12 DIAGNOSIS — E1169 Type 2 diabetes mellitus with other specified complication: Secondary | ICD-10-CM | POA: Diagnosis not present

## 2020-02-12 DIAGNOSIS — D378 Neoplasm of uncertain behavior of other specified digestive organs: Secondary | ICD-10-CM | POA: Diagnosis not present

## 2020-02-12 DIAGNOSIS — E785 Hyperlipidemia, unspecified: Secondary | ICD-10-CM | POA: Diagnosis not present

## 2020-02-12 DIAGNOSIS — H409 Unspecified glaucoma: Secondary | ICD-10-CM | POA: Diagnosis not present

## 2020-02-12 DIAGNOSIS — G2581 Restless legs syndrome: Secondary | ICD-10-CM | POA: Diagnosis not present

## 2020-02-12 DIAGNOSIS — I2510 Atherosclerotic heart disease of native coronary artery without angina pectoris: Secondary | ICD-10-CM | POA: Diagnosis not present

## 2020-02-12 DIAGNOSIS — I4821 Permanent atrial fibrillation: Secondary | ICD-10-CM | POA: Diagnosis not present

## 2020-02-12 DIAGNOSIS — F329 Major depressive disorder, single episode, unspecified: Secondary | ICD-10-CM | POA: Diagnosis not present

## 2020-02-12 DIAGNOSIS — M5416 Radiculopathy, lumbar region: Secondary | ICD-10-CM | POA: Diagnosis not present

## 2020-02-12 DIAGNOSIS — G4700 Insomnia, unspecified: Secondary | ICD-10-CM | POA: Diagnosis not present

## 2020-02-12 DIAGNOSIS — R32 Unspecified urinary incontinence: Secondary | ICD-10-CM | POA: Diagnosis not present

## 2020-02-12 DIAGNOSIS — G8929 Other chronic pain: Secondary | ICD-10-CM | POA: Diagnosis not present

## 2020-02-12 DIAGNOSIS — E662 Morbid (severe) obesity with alveolar hypoventilation: Secondary | ICD-10-CM | POA: Diagnosis not present

## 2020-02-12 DIAGNOSIS — M8580 Other specified disorders of bone density and structure, unspecified site: Secondary | ICD-10-CM | POA: Diagnosis not present

## 2020-02-12 DIAGNOSIS — I5030 Unspecified diastolic (congestive) heart failure: Secondary | ICD-10-CM | POA: Diagnosis not present

## 2020-02-12 DIAGNOSIS — D500 Iron deficiency anemia secondary to blood loss (chronic): Secondary | ICD-10-CM | POA: Diagnosis not present

## 2020-02-12 DIAGNOSIS — Z7901 Long term (current) use of anticoagulants: Secondary | ICD-10-CM | POA: Diagnosis not present

## 2020-02-17 ENCOUNTER — Ambulatory Visit (HOSPITAL_COMMUNITY)
Admission: RE | Admit: 2020-02-17 | Discharge: 2020-02-17 | Disposition: A | Discharge status: Home / Self Care | Payer: PPO | Source: Ambulatory Visit | Attending: Internal Medicine | Admitting: Internal Medicine

## 2020-02-17 ENCOUNTER — Encounter: Payer: Self-pay | Admitting: Internal Medicine

## 2020-02-17 ENCOUNTER — Other Ambulatory Visit: Payer: Self-pay

## 2020-02-17 DIAGNOSIS — D500 Iron deficiency anemia secondary to blood loss (chronic): Secondary | ICD-10-CM | POA: Diagnosis not present

## 2020-02-17 MED ORDER — SODIUM CHLORIDE 0.9 % IV SOLN
INTRAVENOUS | Status: DC | PRN
  Administered 2020-02-17: 250 mL via INTRAVENOUS

## 2020-02-17 MED ORDER — SODIUM CHLORIDE 0.9 % IV SOLN
25.0000 mg | Freq: Once | INTRAVENOUS | Status: AC
  Administered 2020-02-17: 25 mg via INTRAVENOUS
  Filled 2020-02-17: qty 0.5

## 2020-02-17 MED ORDER — SODIUM CHLORIDE 0.9 % IV SOLN
1000.0000 mg | Freq: Once | INTRAVENOUS | Status: AC
  Administered 2020-02-17: 1000 mg via INTRAVENOUS
  Filled 2020-02-17: qty 20

## 2020-02-17 NOTE — Discharge Instructions (Signed)
Iron Dextran injection What is this medicine? IRON DEXTRAN (AHY ern DEX tran) is an iron complex. Iron is used to make healthy red blood cells, which carry oxygen and nutrients through the body. This medicine is used to treat people who cannot take iron by mouth and have low levels of iron in the blood. This medicine may be used for other purposes; ask your health care provider or pharmacist if you have questions. COMMON BRAND NAME(S): Dexferrum, INFeD What should I tell my health care provider before I take this medicine? They need to know if you have any of these conditions:  anemia not caused by low iron levels  heart disease  high levels of iron in the blood  kidney disease  liver disease  an unusual or allergic reaction to iron, other medicines, foods, dyes, or preservatives  pregnant or trying to get pregnant  breast-feeding How should I use this medicine? This medicine is for injection into a vein or a muscle. It is given by a health care professional in a hospital or clinic setting. Talk to your pediatrician regarding the use of this medicine in children. While this drug may be prescribed for children as young as 4 months old for selected conditions, precautions do apply. Overdosage: If you think you have taken too much of this medicine contact a poison control center or emergency room at once. NOTE: This medicine is only for you. Do not share this medicine with others. What if I miss a dose? It is important not to miss your dose. Call your doctor or health care professional if you are unable to keep an appointment. What may interact with this medicine? Do not take this medicine with any of the following medications:  deferoxamine  dimercaprol  other iron products This medicine may also interact with the following medications:  chloramphenicol  deferasirox This list may not describe all possible interactions. Give your health care provider a list of all the  medicines, herbs, non-prescription drugs, or dietary supplements you use. Also tell them if you smoke, drink alcohol, or use illegal drugs. Some items may interact with your medicine. What should I watch for while using this medicine? Visit your doctor or health care professional regularly. Tell your doctor if your symptoms do not start to get better or if they get worse. You may need blood work done while you are taking this medicine. You may need to follow a special diet. Talk to your doctor. Foods that contain iron include: whole grains/cereals, dried fruits, beans, or peas, leafy green vegetables, and organ meats (liver, kidney). Long-term use of this medicine may increase your risk of some cancers. Talk to your doctor about how to limit your risk. What side effects may I notice from receiving this medicine? Side effects that you should report to your doctor or health care professional as soon as possible:  allergic reactions like skin rash, itching or hives, swelling of the face, lips, or tongue  blue lips, nails, or skin  breathing problems  changes in blood pressure  chest pain  confusion  fast, irregular heartbeat  feeling faint or lightheaded, falls  fever or chills  flushing, sweating, or hot feelings  joint or muscle aches or pains  pain, tingling, numbness in the hands or feet  seizures  unusually weak or tired Side effects that usually do not require medical attention (report to your doctor or health care professional if they continue or are bothersome):  change in taste (metallic taste)    diarrhea  headache  irritation at site where injected  nausea, vomiting  stomach upset This list may not describe all possible side effects. Call your doctor for medical advice about side effects. You may report side effects to FDA at 1-800-FDA-1088. Where should I keep my medicine? This drug is given in a hospital or clinic and will not be stored at home. NOTE: This  sheet is a summary. It may not cover all possible information. If you have questions about this medicine, talk to your doctor, pharmacist, or health care provider.  2020 Elsevier/Gold Standard (2007-12-16 16:59:50)  

## 2020-02-17 NOTE — Progress Notes (Signed)
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 Test-dose given and patient tolerated well with no adverse reaction. Patient observed for 1 hour post test-dose. Full dose given. Vital signs remained stable throughout infusion. Discharge instructions given. Patient alert, oriented and ambulatory at discharge.

## 2020-02-22 ENCOUNTER — Telehealth: Payer: Self-pay | Admitting: Internal Medicine

## 2020-02-22 NOTE — Telephone Encounter (Signed)
Called Caryl Pina with [employer] letting him know that we did receive the ONO with the attached form stating that pt may be a cardiac telemetry candidate. Stated to him that we will give this to [facility] and he verbalized understanding. Also stated to him that pt has an OV scheduled 81/6. Nothing further needed.

## 2020-02-29 ENCOUNTER — Telehealth: Payer: Self-pay | Admitting: *Deleted

## 2020-02-29 NOTE — Telephone Encounter (Signed)
   [facility] Pre-operative Risk Assessment    PRIMARY CARDIOLOGIST DR [place] for surgical clearance:  1. What type of surgery is being performed?  TWO  TEETH EXTRACTIONS  2. When is this surgery scheduled?  TBA  3. What type of clearance is required (medical clearance vs. Pharmacy clearance to hold med vs. Both)? BOTH   4. Are there any medications that need to be held prior to surgery and how long? [place]   5. Practice name and name of physician performing surgery?  [facility]; DR Nicki Reaper [facility] DDS  6. What is the office phone number?  [phone]   ATTN; [place]   7.   What is the office fax number?  [place]   Anesthesia type (None, local, MAC, general) ? IV sedation using medication including  VERSED, ATROPINE  , FENTANYL, DECADRON, SODIUM BREVITAL    Raiford Simmonds 02/29/2020, 5:01 PM  _________________________________________________________________   (provider comments below)

## 2020-03-01 NOTE — Telephone Encounter (Signed)
Patient with diagnosis of afib on Xarelto for anticoagulation.    Procedure: 2 teeth extraction Date of procedure: TBD  CHADS2-VASc score of  6 (CHF, HTN, AGE, DM2, CAD,  female)  CrCl 44 ml/min  Typically do not need to hold anticoagulation for 2 teeth extraction. If oral surgeon feels as though a hold is needed, patient may hold Xarelto 1 day prior.

## 2020-03-01 NOTE — Telephone Encounter (Signed)
   Primary Cardiologist: Sanda Klein, MD  Chart reviewed and patient contacted by phone as part of pre-operative protocol coverage. Given past medical history and time since last visit, based on ACC/AHA guidelines, [name] would be at acceptable risk for the planned procedure without further cardiovascular testing.   We usually don't recommend holding anticoagulation prior to 1 or 2 dental extraction but the patient tells me she may needs implants as well.  OK to hold Xarelto one day pre op- resume as soon as possible post op.   I will route this recommendation to the requesting party via Epic fax function and remove from pre-op pool.  Please call with questions.  Kerin Ransom, PA-C 03/01/2020, 1:24 PM

## 2020-03-02 ENCOUNTER — Other Ambulatory Visit: Payer: Self-pay | Admitting: Internal Medicine

## 2020-03-07 ENCOUNTER — Ambulatory Visit
Admission: RE | Admit: 2020-03-07 | Discharge: 2020-03-07 | Disposition: A | Discharge status: Home / Self Care | Payer: PPO | Source: Ambulatory Visit | Attending: Internal Medicine | Admitting: Internal Medicine

## 2020-03-07 ENCOUNTER — Telehealth: Payer: Self-pay | Admitting: Cardiovascular Disease

## 2020-03-07 DIAGNOSIS — G4452 New daily persistent headache (NDPH): Secondary | ICD-10-CM

## 2020-03-07 DIAGNOSIS — R42 Dizziness and giddiness: Secondary | ICD-10-CM | POA: Diagnosis not present

## 2020-03-07 DIAGNOSIS — R519 Headache, unspecified: Secondary | ICD-10-CM | POA: Diagnosis not present

## 2020-03-07 DIAGNOSIS — G9389 Other specified disorders of brain: Secondary | ICD-10-CM | POA: Diagnosis not present

## 2020-03-07 NOTE — Telephone Encounter (Signed)
STAT if patient feels like he/she is going to faint   1) Are you dizzy now? No   2) Do you feel faint or have you passed out? no  3) Do you have any other symptoms? Weakness, vomiting, dizziness, poor balance  4) Have you checked your HR and BP (record if available)?   123/60 (pt does not remember the bottom #) HR 39  Pt waited an hour and her HR was 60 She knew her BP was "fine" so she did not remember it the second time she took it.  Pt c/o Shortness Of Breath: STAT if SOB developed within the last 24 hours or pt is noticeably SOB on the phone  1. Are you currently SOB (can you hear that pt is SOB on the phone)? No, pt is sitting down  2. How long have you been experiencing SOB? 2-3 days  3. Are you SOB when sitting or when up moving around? Up and moving around   4. Are you currently experiencing any other symptoms? See above   She wanted to know if she needed to come in to be seen by Dr. Loletha Grayer. Please advise

## 2020-03-07 NOTE — Progress Notes (Signed)
Cardiology Clinic Note   Patient Name: [name] Date of Encounter: 03/08/2020  Primary Care Provider:  Hoyt Koch, MD Primary Cardiologist:  Janice Klein, MD  Patient Profile    [name] 69 year old female presents today for an evaluation of her shortness of breath.  Past Medical History    Past Medical History:  Diagnosis Date  . Anemia, unspecified   . Arthritis   . Asthma   . Atrial fibrillation with RVR ([name])    a. on Xarelto  . Bell's palsy    Facial nerve decompression in 2001  . CHF (congestive heart failure) ([name])   . Chronic low back pain   . COPD with asthma ([place])   . Coronary artery disease    Myoview 04/12/11 was entirely normal. ECHO 02/26/08 showed only minor abnormalities. Stenting 05/26/08 of her posterolateral branch to the left circumflex coronary artery. Used a 2.5x26mm Taxus Monorail stent.myoview 2014 was without ischemia  . Diabetes mellitus    Type 2  . Early cataracts, bilateral   . Fatty liver   . GERD (gastroesophageal reflux disease)   . Glaucoma   . Glaucoma   . Goiter   . Heart murmur   . History of nuclear stress test 2012; 2014   lexiscan; normal pattern of perfusion; normal, low risk scan   . Hyperlipidemia   . Hypertension   . Panic disorder   . Pneumonia 2008  . Polycystic ovary disease    Hysterectomy in 1982 for this  . Shortness of breath dyspnea    ECHO 02/26/08 showed only minor abnormalities  . Spinal stenosis    Past Surgical History:  Procedure Laterality Date  . ABDOMINAL HYSTERECTOMY  1982   & BSO; for polycystic ovary disease  . CARDIOVERSION N/A 12/17/2013   Procedure: CARDIOVERSION;  Surgeon: Janice Casino, MD;  Location: [facility] ENDOSCOPY;  Service: Cardiovascular;  Laterality: N/A;  . CARDIOVERSION N/A 09/30/2015   Procedure: CARDIOVERSION;  Surgeon: Janice Spark, MD;  Location: [facility] ENDOSCOPY;  Service: Cardiovascular;  Laterality: N/A;  . CARDIOVERSION N/A 06/22/2016   Procedure:  CARDIOVERSION;  Surgeon: Janice Latch, MD;  Location: [facility] ENDOSCOPY;  Service: Cardiovascular;  Laterality: N/A;  . CENTRAL LINE INSERTION  01/13/2019   Procedure: CENTRAL LINE INSERTION;  Surgeon: Janice Artist, MD;  Location: [place] CV LAB;  Service: Cardiovascular;;  . COLONOSCOPY     last 2009; [name]; due 2019  . COLONOSCOPY WITH PROPOFOL N/A 01/23/2018   Procedure: COLONOSCOPY WITH PROPOFOL;  Surgeon: Janice Lobo, MD;  Location: [place];  Service: Endoscopy;  Laterality: N/A;  . CORONARY ANGIOPLASTY  05/26/2008   Stenting of her posterolateral branch to the left circumflex coronary artery. Used a 2.5x44mm Taxus Monorail stent.  . ESOPHAGOGASTRODUODENOSCOPY (EGD) WITH PROPOFOL N/A 01/23/2018   Procedure: ESOPHAGOGASTRODUODENOSCOPY (EGD) WITH PROPOFOL;  Surgeon: Janice Lobo, MD;  Location: [place];  Service: Endoscopy;  Laterality: N/A;  . FACIAL NERVE DECOMPRESSION  2001/2002   bells palsy   . LAPAROSCOPIC CHOLECYSTECTOMY  06/15/2011    [name]  . PERICARDIOCENTESIS N/A 01/13/2019   Procedure: PERICARDIOCENTESIS;  Surgeon: Janice Blanks, MD;  Location: [place] CV LAB;  Service: Cardiovascular;  Laterality: N/A;  . RIGHT AND LEFT HEART CATH N/A 01/13/2019   Procedure: RIGHT AND LEFT HEART CATH;  Surgeon: Janice Artist, MD;  Location: [place] CV LAB;  Service: Cardiovascular;  Laterality: N/A;  . TEE WITHOUT CARDIOVERSION N/A 12/17/2013   Procedure: TRANSESOPHAGEAL ECHOCARDIOGRAM (TEE);  Surgeon: Janice Casino, MD;  Location: [facility] ENDOSCOPY;  Service: Cardiovascular;  Laterality: N/A;  trish/ja  . TRANSTHORACIC ECHOCARDIOGRAM  5/68/1275   LV systolic function normal with mild conc LVH; LA mildly dilated; trace MR/TR  . UPPER GI ENDOSCOPY  2009   negative    Allergies  Allergies  Allergen Reactions  . Benadryl [Diphenhydramine Hcl] Other (See Comments)    Restless leg  . Clopidogrel Bisulfate Nausea Only    Nausea & pain  .  Contrast Media [Iodinated Diagnostic Agents] Palpitations    Rapid heart rate, hot    History of Present Illness    [name] has a past medical history of type 2 diabetes, essential hypertension, coronary artery disease status post PCI (posterior lateral ventricular branch stent 2009) persistent atrial fibrillation, essential hypertension, pericardiocentesis for hemothorax with tamponade 6/20, while on treatment with anticoagulation.  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  Echocardiogram 12/20 showed an LVEF of 45-50%.  Echocardiogram 6/20 showed an EF of 60%.  She underwent nuclear stress test in 2012, 2014, and 2016 which showed normal perfusion patterns.  Her chest x-ray 2/21 showed cardiomegaly, pleural effusions or heart failure findings.  She was last seen by [name] on 11/25/2019.  During that time she indicated that she had not been doing well for around 5 weeks.  She noticed distention in her abdomen and increased shortness of breath with activity.  This was a known heart failure symptoms in the past.  She did not have lower extremity edema.  Her weight had gone up around 5-6 pounds (182 on her home scale and 188 on office scale).  She denied orthopnea and PND.  She was cardiac unaware and denied chest pain.  She did however monitor her heart rate and noticed that over the past several weeks her heart rate had been as high as 124.  Her ECG records were reviewed and her ventricular rate was typically from 100-1 15 range through her office visits in 2020.  She presents the clinic today after contacting the nurse triage line 03/07/2020, indicating that she had increased shortness of breath and had a weight gain of 5 to 6 pounds.  She states she is beginning to feel much better than she did yesterday.  She reviewed her medications and realized that she had taken a double dose of her metoprolol succinate instead of taking 2 tablets of her torsemide.  She noticed that her heart rate gradually  improved throughout the day and by the evening she was feeling somewhat normal.  She is now has a better medication delivery system and does not feel this area will happen again.  She states that she does maintain some dizziness but is very careful and has not had any falls.  I will order a BMP today and have her follow-up for reevaluation in 1 month.  Today she denies chest pain, shortness of breath, lower extremity edema, fatigue, palpitations, melena, hematuria, hemoptysis, diaphoresis, weakness, presyncope, syncope, orthopnea, and PND.   Home Medications    Prior to Admission medications   Medication Sig Start Date End Date Taking? Authorizing Provider  ascorbic acid (VITAMIN C) 1000 MG tablet Take 1,000 mg by mouth daily.     [provider]  CALCIUM-MAG-VIT C-VIT D PO Take 1 tablet by mouth daily.     [provider]  celecoxib (CELEBREX) 200 MG capsule Take 200 mg by mouth 2 (two) times daily.  05/22/19   [provider]  Continuous Blood  Gluc Sensor (FREESTYLE LIBRE SENSOR SYSTEM) MISC Use to monitor sugars 01/06/20   Janice Koch, MD  diltiazem (CARDIZEM CD) 180 MG 24 hr capsule TAKE 1 CAPSULE DAILY. 12/22/19   Lorretta Harp, MD  gabapentin (NEURONTIN) 100 MG capsule TAKE (1) CAPSULE THREE TIMES DAILY. 03/03/20   Janice Koch, MD  guaiFENesin (MUCINEX) 600 MG 12 hr tablet Take 600 mg by mouth 2 (two) times daily as needed for cough.     [provider]  HYDROcodone-acetaminophen (NORCO/VICODIN) 5-325 MG tablet Take 1 tablet by mouth at bedtime. 02/11/20   Janice Koch, MD  INVOKANA 100 MG TABS tablet TAKE 1 TABLET ONCE DAILY. 11/10/19   Janice Koch, MD  latanoprost (XALATAN) 0.005 % ophthalmic solution Place 1 drop into both eyes at bedtime. 12/09/13   [provider]  LORazepam (ATIVAN) 0.5 MG tablet TAKE 1 OR 2 TABLETS AT BEDTIME. 09/30/19   Janice Koch, MD  metoprolol succinate (TOPROL-XL) 100 MG 24  hr tablet Take 1 tablet (100 mg total) by mouth 2 (two) times daily. Take with or immediately following a meal. 12/07/19   Croitoru, Mihai, MD  metoprolol tartrate (LOPRESSOR) 25 MG tablet Take one tablet daily as needed for rapid palpitations. 12/07/19   Croitoru, Mihai, MD  Multiple Vitamins-Iron (MULTIVITAMINS WITH IRON) TABS Take 1 tablet by mouth daily.     [provider]  nystatin ointment (MYCOSTATIN) Apply 1 application topically 2 (two) times daily as needed (IRRITATION).  07/09/16   [provider]  omeprazole (PRILOSEC) 20 MG capsule Take 1 capsule (20 mg total) by mouth 2 (two) times daily before a meal. 12/31/18   Vann, Tomi Bamberger, DO  ONETOUCH DELICA LANCETS FINE MISC 1 Units by Does not apply route as directed. 10/05/11   Midge Minium, MD  PARoxetine (PAXIL) 10 MG tablet TAKE 2 AND 1/2 TABS DAILY 01/19/20   Janice Koch, MD  potassium chloride (MICRO-K) 10 MEQ CR capsule TAKE (2) CAPSULES DAILY. 10/23/19   Croitoru, Mihai, MD  quinapril (ACCUPRIL) 40 MG tablet Take 1 tablet (40 mg total) by mouth daily. 12/07/19   Croitoru, Mihai, MD  rOPINIRole (REQUIP XL) 2 MG 24 hr tablet Take 1 tablet (2 mg total) by mouth at bedtime. 11/02/19   Dohmeier, Asencion Partridge, MD  rosuvastatin (CRESTOR) 10 MG tablet Take 1 tablet (10 mg total) by mouth daily. 12/07/19 03/06/20  Croitoru, Mihai, MD  senna (SENOKOT) 8.6 MG TABS Take 1 tablet by mouth at bedtime.     [provider]  sitaGLIPtin-metformin (JANUMET) 50-1000 MG tablet TAKE 1/2 TABLET IN THE MORNING WITH BREAKFAST AND 1 TABLET IN THE EVENING WITH SUPPER. 05/29/19   Janice Koch, MD  STIOLTO RESPIMAT 2.5-2.5 MCG/ACT AERS INHALE 2 PUFFS INTO LUNGS ONCE DAILY. 12/28/19   Baird Lyons D, MD  torsemide (DEMADEX) 20 MG tablet Take 2 tablets (40 mg total) by mouth 2 (two) times daily. 03/23/19   Bensimhon, Shaune Pascal, MD  traZODone (DESYREL) 100 MG tablet TAKE 1/2 TO 1 TABLET AT BEDTIME. 07/30/19   Janice Koch,  MD  XARELTO 20 MG TABS tablet TAKE 1 TABLET ONCE DAILY WITH SUPPER. 01/12/20   Croitoru, Dani Gobble, MD    Family History    Family History  Problem Relation Age of Onset  . Heart attack Father 52       2nd MI at 62  . Colon cancer Brother 23  . Gout Brother   . Ulcers Mother   .  Emphysema Mother 30  . Colon polyps Sister   . Cancer Sister        Basal cell carcinoma  . Pneumonia Maternal Grandmother   . Hypertension Brother   . Hyperlipidemia Brother   . Cancer Brother        Skin  . Diabetes Neg Hx   . Stroke Neg Hx    She indicated that her mother is deceased. She indicated that her father is deceased. She indicated that her sister is alive. She indicated that both of her brothers are alive. She indicated that her maternal grandmother is deceased. She indicated that her maternal grandfather is deceased. She indicated that her paternal grandmother is deceased. She indicated that her paternal grandfather is deceased. She indicated that the status of her neg hx is unknown.  Social History    Social History   Socioeconomic History  . Marital status: Married    Spouse name: Not on file  . Number of children: 2  . Years of education: master's  . Highest education level: Not on file  Occupational History  . Occupation: Electrical engineer    Employer: [employer]  Tobacco Use  . Smoking status: Former Smoker    Packs/day: 1.50    Years: 30.00    Pack years: 45.00    Types: Cigarettes    Quit date: 08/13/1993    Years since quitting: 26.5  . Smokeless tobacco: Never Used  Vaping Use  . Vaping Use: Never used  Substance and Sexual Activity  . Alcohol use: No  . Drug use: No  . Sexual activity: Not on file  Other Topics Concern  . Not on file  Social History Narrative  . Not on file   Social Determinants of Health   Financial Resource Strain:   . Difficulty of Paying Living Expenses:   Food Insecurity:   . Worried About Charity fundraiser in the Last Year:    . Arboriculturist in the Last Year:   Transportation Needs:   . Film/video editor (Medical):   [name] Lack of Transportation (Non-Medical):   Physical Activity:   . Days of Exercise per Week:   . Minutes of Exercise per Session:   Stress:   . Feeling of Stress :   Social Connections:   . Frequency of Communication with Friends and Family:   . Frequency of Social Gatherings with Friends and Family:   . Attends Religious Services:   . Active Member of Clubs or Organizations:   . Attends Archivist Meetings:   [name] Marital Status:   Intimate Partner Violence:   . Fear of Current or Ex-Partner:   . Emotionally Abused:   [name] Physically Abused:   . Sexually Abused:      Review of Systems    General:  No chills, fever, night sweats or weight changes.  Cardiovascular:  No chest pain, dyspnea on exertion, edema, orthopnea, palpitations, paroxysmal nocturnal dyspnea. Dermatological: No rash, lesions/masses Respiratory: No cough, dyspnea Urologic: No hematuria, dysuria Abdominal:   No nausea, vomiting, diarrhea, bright red blood per rectum, melena, or hematemesis Neurologic:  No visual changes, wkns, changes in mental status. All other systems reviewed and are otherwise negative except as noted above.  Physical Exam    VS:  BP 118/66   Pulse 59   Ht 5\' 2"  (1.575 m)   Wt 186 lb (84.4 kg)   SpO2 99%   BMI 34.02 kg/m  , BMI Body  mass index is 34.02 kg/m. GEN: Well nourished, well developed, in no acute distress. HEENT: normal. Neck: Supple, no JVD, carotid bruits, or masses. Cardiac: RRR, no murmurs, rubs, or gallops. No clubbing, cyanosis, edema.  Radials/DP/PT 2+ and equal bilaterally.  Respiratory:  Respirations regular and unlabored, clear to auscultation bilaterally. GI: Soft, nontender, nondistended, BS + x 4. MS: no deformity or atrophy. Skin: warm and dry, no rash. Neuro:  Strength and sensation are intact. Psych: Normal affect.  Accessory Clinical Findings     Recent Labs: 04/02/2019: BNP 149.8 01/08/2020: ALT 15; BUN 24; Creatinine, Ser 1.17; Hemoglobin 9.6; Platelets 223.0; Potassium 4.4; Pro B Natriuretic peptide (BNP) 220.0; Sodium 122; TSH 1.81   Recent Lipid Panel    Component Value Date/Time   CHOL 188 11/17/2019 1534   TRIG 215 (H) 11/17/2019 1534   HDL 43 11/17/2019 1534   CHOLHDL 4.4 11/17/2019 1534   CHOLHDL 2 10/02/2018 1203   VLDL 24.6 10/02/2018 1203   LDLCALC 108 (H) 11/17/2019 1534   LDLDIRECT 143.4 01/23/2008 1316    ECG personally reviewed by me today-atrial fibrillation with slow ventricular response 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 Abnormal septal motion and hypokinesis as well as inferior basal  hypokinesis EF similar to echo done 05/2019 or slightly better . Left  ventricular ejection fraction, by estimation, is 50 to 55%. The left  ventricle has low normal function. The left  ventricle demonstrates regional wall motion abnormalities (see scoring  diagram/findings for description). Left ventricular diastolic parameters  are indeterminate.  2. Right ventricular systolic function is normal. The right ventricular  size is normal.  3. Left atrial size was moderately dilated.  4. Right atrial size was moderately dilated.  5. The mitral valve is degenerative. Trivial mitral valve regurgitation.  No evidence of mitral stenosis.  6. The aortic valve is tricuspid. Aortic valve regurgitation is trivial.  Mild to moderate aortic valve sclerosis/calcification is present, without  any evidence of aortic stenosis.  7. The inferior vena cava is normal in size with greater than 50%  respiratory variability, suggesting right atrial pressure of 3 mmHg.   Comparison(s): 05/19/19 EF 45-50%.   Cardiac catheterization 01/13/2019 Cardiac tamponade 2. Successful pericardiocentesis with  removal of 900 cc of bloody fluid  Recommendations: Will leave drain in place for at least 25 hours. Fluid studies to be sent to lab.  Pre-pericardiocentesis  Ao = 120/59 (75) LV = 120/36 RA = 36 RV = 72/40 PA = 74/36 (57) PCW = 44 Fick cardiac output/index = 5.6/2.9 PVR = 2.2 WU FA sat = 100% PA sat = 56% +RV-LV interaction + equalization of diastolic pressures  Post-tap  RA = 18 RV = 75/15 PA = 76/24 (48) PCWP = 26 Fick 8.5/4.4 PVR = 2.5 WU  Assessment: 1. Evidence of pericardial tamponade  2. 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  This is a low risk study.   Nl Lexiscan myoview. Study did not gate secondary to AFIB     Assessment & Plan   1.  Chronic systolic CHF-euvolemic today.  Has had some increased shortness of breath and DOE seems to be related to atrial fibrillation with slow ventricular rate.  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  Discussed the possibility of using a cardiac event monitor, she wishes to defer cardiac monitoring at this time.  She states that she is 99% confident that she did take an extra dose of her metoprolol and a half dose of her torsemide which caused her symptoms. 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  Cardiac catheterization 2009 with stent to posterior lateral ventricular branch.  Normal nuclear  stress test 2016. Continue rosuvastatin, metoprolol, diltiazem Heart healthy low-sodium diet-salty 6 given Increase physical activity as tolerated  Essential hypertension-BP today 118/66.  Well-controlled at home Continue metoprolol, diltiazem, torsemide Heart healthy low-sodium diet-salty 6 given Increase physical activity as tolerated  Hyperlipidemia-11/17/2019: Cholesterol, Total 188; HDL 43; LDL Chol Calc (NIH) 108; Triglycerides 215 Continue rosuvastatin Heart healthy low-sodium high-fiber diet.   Increase physical activity as tolerated  Diabetes mellitus type 2-A1c 7.9 on 01/08/2020 Continue SGL 2 inhibitor, Metformin, sitagliptin Heart healthy low-sodium diet-salty 6 given Increase physical activity as tolerated Monitored by PCP  Disposition: Follow-up with Janice. Sallyanne Kuster or me in 1 month    Jossie Ng. Carmelo Reidel NP-C    03/08/2020, 11:56 AM [place] [address] Office [phone] Fax [phone]

## 2020-03-07 NOTE — Telephone Encounter (Signed)
Called patient no answer.LMTC. 

## 2020-03-07 NOTE — Telephone Encounter (Signed)
Returned call to patient.She stated she started feeling bad yesterday.Sob,dizzy,off balance,weak.Weight up at 182 lbs this morning.Normal weight 178 lbs.Swelling in lower legs,left is worse.Stated she does not sleep well at night her pulse at 2:00 am 39.This morning pulse 57.She requested appointment with Dr.Croitoru.Advised Dr.Croitoru is out of office this week.Appointment scheduled with Coletta Memos NP 7/27 at 11:15 am.Advised I will make Dr.Croitoru aware.

## 2020-03-07 NOTE — Telephone Encounter (Signed)
Please stop the diltiazem. Do take the metoprolol today, but do not take it tomorrow until she is seen by Denyse Amass.

## 2020-03-08 ENCOUNTER — Ambulatory Visit (INDEPENDENT_AMBULATORY_CARE_PROVIDER_SITE_OTHER): Payer: PPO | Admitting: General Practice

## 2020-03-08 ENCOUNTER — Encounter: Payer: Self-pay | Admitting: General Practice

## 2020-03-08 ENCOUNTER — Other Ambulatory Visit: Payer: Self-pay

## 2020-03-08 VITALS — BP 118/66 | HR 59 | Ht 62.0 in | Wt 186.0 lb

## 2020-03-08 DIAGNOSIS — I10 Essential (primary) hypertension: Secondary | ICD-10-CM

## 2020-03-08 DIAGNOSIS — I4821 Permanent atrial fibrillation: Secondary | ICD-10-CM

## 2020-03-08 DIAGNOSIS — E785 Hyperlipidemia, unspecified: Secondary | ICD-10-CM

## 2020-03-08 DIAGNOSIS — E1169 Type 2 diabetes mellitus with other specified complication: Secondary | ICD-10-CM | POA: Diagnosis not present

## 2020-03-08 DIAGNOSIS — I5022 Chronic systolic (congestive) heart failure: Secondary | ICD-10-CM

## 2020-03-08 DIAGNOSIS — I2510 Atherosclerotic heart disease of native coronary artery without angina pectoris: Secondary | ICD-10-CM

## 2020-03-08 DIAGNOSIS — E669 Obesity, unspecified: Secondary | ICD-10-CM | POA: Diagnosis not present

## 2020-03-08 NOTE — Progress Notes (Signed)
Well, doubling the beta-blocker instead of the diuretic will cause bradycardia all right.  Thank you Denyse Amass

## 2020-03-08 NOTE — Patient Instructions (Signed)
Medication Instructions:  Your physician recommends that you continue on your current medications as directed. Please refer to the Current Medication list given to you today.  *If you need a refill on your cardiac medications before your next appointment, please call your pharmacy*   Lab Work: Your physician recommends that you return for lab work today: BMET  If you have labs (blood work) drawn today and your tests are completely normal, you will receive your results only by: [name] MyChart Message (if you have MyChart) OR . A paper copy in the mail If you have any lab test that is abnormal or we need to change your treatment, we will call you to review the results.  Follow-Up: At [facility], you and your health needs are our priority.  As part of our continuing mission to provide you with exceptional heart care, we have created designated Provider Care Teams.  These Care Teams include your primary Cardiologist (physician) and Advanced Practice Providers (APPs -  Physician Assistants and Nurse Practitioners) who all work together to provide you with the care you need, when you need it.  We recommend signing up for the patient portal called "MyChart".  Sign up information is provided on this After Visit Summary.  MyChart is used to connect with patients for Virtual Visits (Telemedicine).  Patients are able to view lab/test results, encounter notes, upcoming appointments, etc.  Non-urgent messages can be sent to your provider as well.   To learn more about what you can do with MyChart, go to [email or web address].    Your next appointment:   1 month(s)  The format for your next appointment:   In Person  Provider:   Coletta Memos, NP or Dr. Sallyanne Kuster   Other Instructions Your physician recommends that you weigh, daily, at the same time every day, and in the same amount of clothing. Please record your daily weights on the handout provided and bring it to your next appointment.

## 2020-03-08 NOTE — Telephone Encounter (Signed)
Patient had an office visit today, 03/08/20

## 2020-03-09 LAB — BASIC METABOLIC PANEL
BUN/Creatinine Ratio: 19 (ref 12–28)
BUN: 27 mg/dL (ref 8–27)
CO2: 21 mmol/L (ref 20–29)
Calcium: 9 mg/dL (ref 8.7–10.3)
Chloride: 80 mmol/L — ABNORMAL LOW (ref 96–106)
Creatinine, Ser: 1.44 mg/dL — ABNORMAL HIGH (ref 0.57–1.00)
GFR calc Af Amer: 42 mL/min/{1.73_m2} — ABNORMAL LOW (ref >59)
GFR calc non Af Amer: 37 mL/min/{1.73_m2} — ABNORMAL LOW (ref >59)
Glucose: 105 mg/dL — ABNORMAL HIGH (ref 65–99)
Potassium: 4.4 mmol/L (ref 3.5–5.2)
Sodium: 120 mmol/L — ABNORMAL LOW (ref 134–144)

## 2020-03-09 NOTE — Telephone Encounter (Signed)
Her overnight oximetry test showed oxygen saturation of 89% or less for over 17 minutes. With her heart disease, that qualifies her to to wear home oxygen while sleeping.  Please order DME (if she doesn't have one)  New O2 2L sleep for dx nocturnal Hypoxemia, Congestive heart failure, Atrial fibrillation

## 2020-03-09 NOTE — Telephone Encounter (Signed)
Called and left message for patient to return call.  

## 2020-03-10 ENCOUNTER — Telehealth: Payer: Self-pay | Admitting: General Practice

## 2020-03-10 NOTE — Telephone Encounter (Signed)
Contacted patient today to review lab work from 03/08/2020.  Her creatinine was elevated at 1.44 and GFR reduced to 37.  I instructed her to reduce her torsemide to 20 mg twice daily instead of 40 mg twice daily.  She was also instructed to eat a heart healthy diet and her low sodium level was reviewed.  Her sodium is 120 I have asked her to slightly increase the sodium in her diet and present to her PCP for further evaluation/treatment.  During her evaluation on 03/08/2020 she indicated that she had on 2-3 separate occasions rolled out of bed hitting her head.  CT head from 03/07/2020 was reviewed showing no acute changes.  I have asked her to continue to weigh herself daily and restrict her fluids to 80 ounces a day.  She expressed understanding, stated that she feels well, and will contact her PCPs office for an appointment as soon as possible.

## 2020-03-10 NOTE — Telephone Encounter (Signed)
Forwarded to PCP via [facility] fax function

## 2020-03-11 ENCOUNTER — Encounter: Payer: Self-pay | Admitting: Internal Medicine

## 2020-03-14 ENCOUNTER — Ambulatory Visit (INDEPENDENT_AMBULATORY_CARE_PROVIDER_SITE_OTHER): Payer: PPO | Admitting: Internal Medicine

## 2020-03-14 ENCOUNTER — Other Ambulatory Visit: Payer: Self-pay

## 2020-03-14 ENCOUNTER — Encounter: Payer: Self-pay | Admitting: Internal Medicine

## 2020-03-14 VITALS — BP 120/72 | HR 87 | Temp 97.7°F | Ht 62.0 in | Wt 186.0 lb

## 2020-03-14 DIAGNOSIS — E871 Hypo-osmolality and hyponatremia: Secondary | ICD-10-CM

## 2020-03-14 NOTE — Progress Notes (Signed)
   Subjective:   Patient ID: [name], female    DOB: July 04, 1951, 69 y.o.   MRN: [id number]  HPI The patient is a 69 YO female coming in for new problem of hyponatremia. Seen by her cardiologist and they did cut her torsemide dose in half after slight elevation in creatinine and low sodium to 120. She is having problems with dizziness which is not new and she is not sure if this is worse recently. She is on low sodium diet with fluid restriction for her heart failure. Weights have been stable recently. Denies being up or down significantly when labs were taken. Denies headaches. Recently did iron infusion and this did not help with fatigue and tiredness as she was hoping it would help. Denies other changes in medications. Not drinking excessive amounts of water. Eating normal diet which has not changed recently.   Review of Systems  Constitutional: Positive for fatigue.  HENT: Negative.   Eyes: Negative.   Respiratory: Positive for shortness of breath. Negative for cough and chest tightness.   Cardiovascular: Negative for chest pain, palpitations and leg swelling.  Gastrointestinal: Negative for abdominal distention, abdominal pain, constipation, diarrhea, nausea and vomiting.  Musculoskeletal: Negative.   Skin: Negative.   Neurological: Positive for dizziness.  Psychiatric/Behavioral: Negative.     Objective:  Physical Exam Constitutional:      Appearance: She is well-developed.  HENT:     Head: Normocephalic and atraumatic.  Cardiovascular:     Rate and Rhythm: Normal rate and regular rhythm.  Pulmonary:     Effort: Pulmonary effort is normal. No respiratory distress.     Breath sounds: Normal breath sounds. No wheezing or rales.  Abdominal:     General: Bowel sounds are normal. There is no distension.     Palpations: Abdomen is soft.     Tenderness: There is no abdominal tenderness. There is no rebound.  Musculoskeletal:     Cervical back: Normal range of motion.  Skin:     General: Skin is warm and dry.  Neurological:     Mental Status: She is alert and oriented to person, place, and time.     Coordination: Coordination abnormal.     Comments: Slow gait     Vitals:   03/14/20 1522  BP: 120/72  Pulse: 87  Temp: 97.7 F (36.5 C)  TempSrc: Oral  SpO2: 95%  Weight: 186 lb (84.4 kg)  Height: 5\' 2"  (1.575 m)    This visit occurred during the SARS-CoV-2 public health emergency.  Safety protocols were in place, including screening questions prior to the visit, additional usage of staff PPE, and extensive cleaning of exam room while observing appropriate contact time as indicated for disinfecting solutions.   Assessment & Plan:

## 2020-03-14 NOTE — Patient Instructions (Signed)
We will check the labs today and call you back about the results.    

## 2020-03-16 ENCOUNTER — Encounter: Payer: Self-pay | Admitting: Internal Medicine

## 2020-03-16 LAB — CBC
HCT: 30.6 % — ABNORMAL LOW (ref 35.0–45.0)
Hemoglobin: 10 g/dL — ABNORMAL LOW (ref 11.7–15.5)
MCH: 25.5 pg — ABNORMAL LOW (ref 27.0–33.0)
MCHC: 32.7 g/dL (ref 32.0–36.0)
MCV: 78.1 fL — ABNORMAL LOW (ref 80.0–100.0)
MPV: 9.1 fL (ref 7.5–12.5)
Platelets: 191 10*3/uL (ref 140–400)
RBC: 3.92 10*6/uL (ref 3.80–5.10)
RDW: 20.8 % — ABNORMAL HIGH (ref 11.0–15.0)
WBC: 6.4 10*3/uL (ref 3.8–10.8)

## 2020-03-16 LAB — RENAL FUNCTION PANEL
Albumin: 4.2 g/dL (ref 3.6–5.1)
BUN/Creatinine Ratio: 17 (calc) (ref 6–22)
BUN: 21 mg/dL (ref 7–25)
CO2: 26 mmol/L (ref 20–32)
Calcium: 8.7 mg/dL (ref 8.6–10.4)
Chloride: 91 mmol/L — ABNORMAL LOW (ref 98–110)
Creat: 1.26 mg/dL — ABNORMAL HIGH (ref 0.60–0.93)
Glucose, Bld: 101 mg/dL — ABNORMAL HIGH (ref 65–99)
Phosphorus: 4.7 mg/dL — ABNORMAL HIGH (ref 2.1–4.3)
Potassium: 4.5 mmol/L (ref 3.5–5.3)
Sodium: 123 mmol/L — ABNORMAL LOW (ref 135–146)

## 2020-03-16 LAB — SODIUM, URINE, RANDOM: Sodium, Ur: <10 mmol/L — ABNORMAL LOW (ref 28–272)

## 2020-03-16 LAB — OSMOLALITY, URINE: Osmolality, Ur: 174 mOsm/kg (ref 50–1200)

## 2020-03-16 NOTE — Assessment & Plan Note (Signed)
Recheck sodium levels. Could be related to diuretic with low sodium diet. Checking urine sodium and osm to assess. She appears to be euvolemic today.

## 2020-03-20 ENCOUNTER — Other Ambulatory Visit: Payer: Self-pay | Admitting: Internal Medicine

## 2020-03-22 ENCOUNTER — Other Ambulatory Visit: Payer: Self-pay | Admitting: Internal Medicine

## 2020-03-22 DIAGNOSIS — R5383 Other fatigue: Secondary | ICD-10-CM

## 2020-03-26 ENCOUNTER — Other Ambulatory Visit: Payer: Self-pay | Admitting: Internal Medicine

## 2020-03-28 ENCOUNTER — Ambulatory Visit: Payer: PPO | Admitting: Internal Medicine

## 2020-03-28 ENCOUNTER — Encounter: Payer: Self-pay | Admitting: Internal Medicine

## 2020-03-28 ENCOUNTER — Other Ambulatory Visit: Payer: Self-pay

## 2020-03-28 VITALS — BP 120/70 | HR 88 | Temp 97.6°F | Ht 62.0 in | Wt 183.4 lb

## 2020-03-28 DIAGNOSIS — I509 Heart failure, unspecified: Secondary | ICD-10-CM

## 2020-03-28 DIAGNOSIS — G4734 Idiopathic sleep related nonobstructive alveolar hypoventilation: Secondary | ICD-10-CM | POA: Diagnosis not present

## 2020-03-28 DIAGNOSIS — J449 Chronic obstructive pulmonary disease, unspecified: Secondary | ICD-10-CM

## 2020-03-28 MED ORDER — BENZONATATE 200 MG PO CAPS
200.0000 mg | ORAL_CAPSULE | Freq: Three times a day (TID) | ORAL | 1 refills | Status: DC | PRN
Start: 2020-03-28 — End: 2020-08-01

## 2020-03-28 NOTE — Telephone Encounter (Signed)
02/28/2020 Lorazepam 0.5 Mg Tablet 60#  Last ov 03/14/20 Next ov n/s

## 2020-03-28 NOTE — Telephone Encounter (Signed)
I do not have ability to do controlled please send to another provider thanks.

## 2020-03-28 NOTE — Patient Instructions (Signed)
Order- DME [facility]    O2 2l for sleep dx Nocturnal Hypoxemia  Order- schedule PFT  Dx COPD mixed type, CHF  Script sent for tessalon perles to use if needed for cough  Ok to continue the [employer] inhaler  Please call if we can help

## 2020-03-28 NOTE — Progress Notes (Signed)
HPI F former smoker last seen in 2020 for COPD with concern of possible DVT, complicated by WC5/ neuropathy, AFib, CHF,HBP, CAD/PCI, COPD, GERD, Hyperlipidemia, Restless Legs, Obesity, Glaucoma Hosp in June, 2020 with pericardial tamponade, cardiogenic shock, CHF. Echo 11/20/2018- EF 55-60%, mild RAE Walk Test 12/21/19- room air- lowest O2 sat 97%, max HR 126. NPSG 08/15/2017- AHI 0/ hr. desat to 89%/ mean 93%, PLMA 23.7/ hr, body weight 194 lbs ONOX 89% or less  For over 17 minutes ([facility]) ------------------------------------------------------------------------------  12/21/19- 63yoF former smoker last seen in 2020 for COPD with concern of possible DVT, complicated by EN2/ neuropathy, AFib, CHF,HBP, CAD/PCI, COPD, GERD, Hyperlipidemia, Restless Legs, Obesity, Glaucoma Hosp in June, 2020 with pericardial tamponade, cardiogenic shock, CHF. ----f/u SOB Body weight today 190 lbs Had 2 Phizer Covax. More fluid retention x 2-3 months.  HR on arrival today 143- in AFib. More fluid retention in last 3 months. Last Echo EF 50-55%. Persistent DOE. In last week has coughed up some thick clear mucus. No blood or fever. Using Stiolto 2.5 2 puffs daily.  Walk Test 12/21/19- room air- lowest O2 sat 97%, max HR 126. CXR 10/10/19-  The heart size appears improved from prior study. Aortic calcifications are noted. There is some atelectasis versus scarring at the lung bases. There is no pneumothorax. No large pleural effusion. No acute osseous abnormality. IMPRESSION: No active cardiopulmonary disease.  03/28/20- 76yoF former smoker last seen in 7782 for COPD , complicated by UM3/ neuropathy, AFib/ Xarelto, CHF,HBP, CAD/PCI, COPD, GERD, Hyperlipidemia, Restless Legs, Obesity, Glaucoma Hosp in June, 2020 with pericardial tamponade, cardiogenic shock, CHF -----CHF, SOB ONOX 89% or less  For over 17 minutes ([facility])> to start sleep O2 Sleeping in recliner Stiolto helps. Cough, mostly dry. Realizes weight  increase related to fluid retention after she reduced diuretic.  Comes today in wheelchair after hurting leg.    ROS-see HPI   + = positive Constitutional:    weight loss+ last year, night sweats, fevers, chills, fatigue, lassitude. HEENT:    headaches, difficulty swallowing, tooth/dental problems, sore throat,       sneezing, itching, ear ache, nasal congestion, post nasal drip, snoring CV:    chest pain, orthopnea, PND, +swelling in lower extremities, anasarca,                                  dizziness, +palpitations Resp:   +shortness of breath with exertion or at rest.                +productive cough,   non-productive cough, coughing up of blood.              change in color of mucus.  wheezing.   Skin:    rash or lesions. GI:  No-   heartburn, indigestion, abdominal pain, nausea, vomiting, diarrhea,                 change in bowel habits, lo+ss of appetite GU: dysuria, change in color of urine, no urgency or frequency.   flank pain. MS:   joint pain, stiffness, decreased range of motion, back pain. Neuro-     nothing unusual Psych:  change in mood or affect.  depression or anxiety.   memory loss.  OBJ- Physical Exam General- Alert, Oriented, Affect-appropriate, Distress- none acute, obese+ Skin- rash-none, lesions- none, excoriation- none Lymphadenopathy- none Head- atraumatic  Eyes- Gross vision intact, PERRLA, conjunctivae and secretions clear            Ears- Hearing, canals-normal            Nose- Clear, no-Septal dev, mucus, polyps, erosion, perforation             Throat- Mallampati II , mucosa clear , drainage- none, tonsils- atrophic Neck- flexible , trachea midline, no stridor , thyroid nl, carotid no bruit Chest - symmetrical excursion , unlabored           Heart/CV- IRR/AFib+ , no murmur , no gallop  , no rub, nl s1 s2                           - JVD- none , edema- none, stasis changes+, varices-+ superficial           Lung- clear to P&A/ no rales, wheeze-  none, cough- none , dullness-none, rub- none           Chest wall-  Abd-  Br/ Gen/ Rectal- Not done, not indicated Extrem- cyanosis- none, clubbing, none, atrophy- none, strength- nl Neuro- grossly intact to observation

## 2020-03-29 ENCOUNTER — Encounter: Payer: Self-pay | Admitting: Internal Medicine

## 2020-03-29 ENCOUNTER — Telehealth: Payer: Self-pay | Admitting: Internal Medicine

## 2020-03-29 ENCOUNTER — Other Ambulatory Visit (HOSPITAL_COMMUNITY): Payer: Self-pay | Admitting: Internal Medicine

## 2020-03-29 DIAGNOSIS — G4734 Idiopathic sleep related nonobstructive alveolar hypoventilation: Secondary | ICD-10-CM

## 2020-03-30 ENCOUNTER — Other Ambulatory Visit: Payer: Self-pay | Admitting: Internal Medicine

## 2020-03-30 DIAGNOSIS — D500 Iron deficiency anemia secondary to blood loss (chronic): Secondary | ICD-10-CM | POA: Diagnosis not present

## 2020-03-30 DIAGNOSIS — R1010 Upper abdominal pain, unspecified: Secondary | ICD-10-CM | POA: Diagnosis not present

## 2020-03-30 DIAGNOSIS — R197 Diarrhea, unspecified: Secondary | ICD-10-CM | POA: Diagnosis not present

## 2020-03-30 NOTE — Telephone Encounter (Signed)
Done erx 

## 2020-03-30 NOTE — Telephone Encounter (Signed)
Called spoke with [name] she states she cannot order night oxygen because the ONO that was ordered in May 2021 is expired.  She states the test and order have to be within 30 days of each other.   Dr. Annamaria Boots please advise if new ONO can be placed for patient.[name]

## 2020-03-30 NOTE — Telephone Encounter (Signed)
Yes please re-order overnight oximetry on room air

## 2020-03-31 NOTE — Telephone Encounter (Signed)
Called Gilda let her know new order is being placed.   Called left Detailed message on VM for patient per DPR  Nothing further is needed at this time.

## 2020-04-05 DIAGNOSIS — G4734 Idiopathic sleep related nonobstructive alveolar hypoventilation: Secondary | ICD-10-CM | POA: Insufficient documentation

## 2020-04-05 NOTE — Assessment & Plan Note (Signed)
Discussed sleep O2. Contributing factors include, COPD, cardiac and obesity Plan- O2 2L sleep

## 2020-04-05 NOTE — Assessment & Plan Note (Signed)
Doubt exacerbation. Symptoms correspond to her reported fluid retention. Plan- continue Stiolto, tessalon, update PFT

## 2020-04-11 IMAGING — CR DG CHEST 2V
2 series · 2 of 2 positions shown · non-contrast
Comparison: January 11, 2019

CLINICAL DATA: Chest and lung tightness.

EXAM:
CHEST - 2 VIEW

[w chest pa]
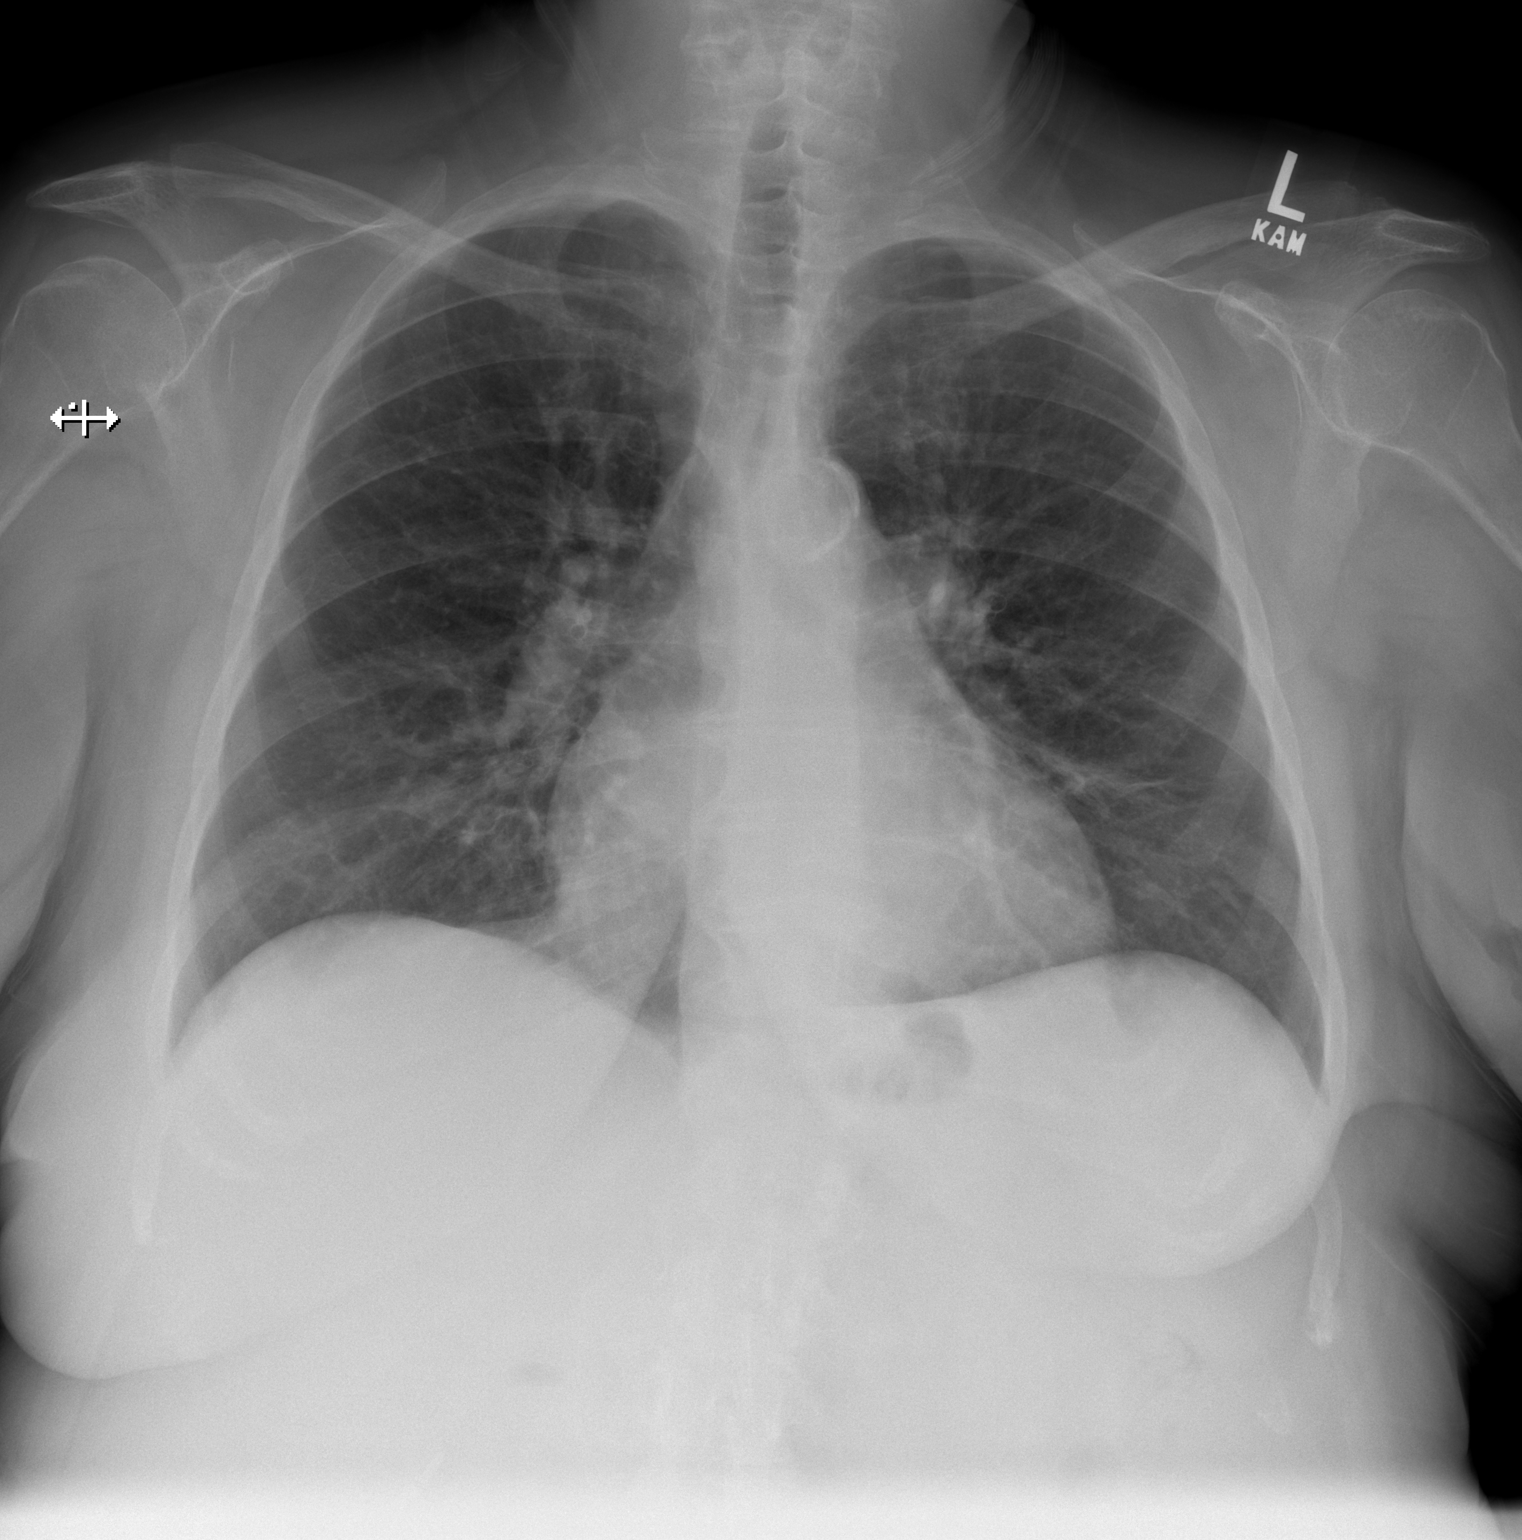

[w chest lat]
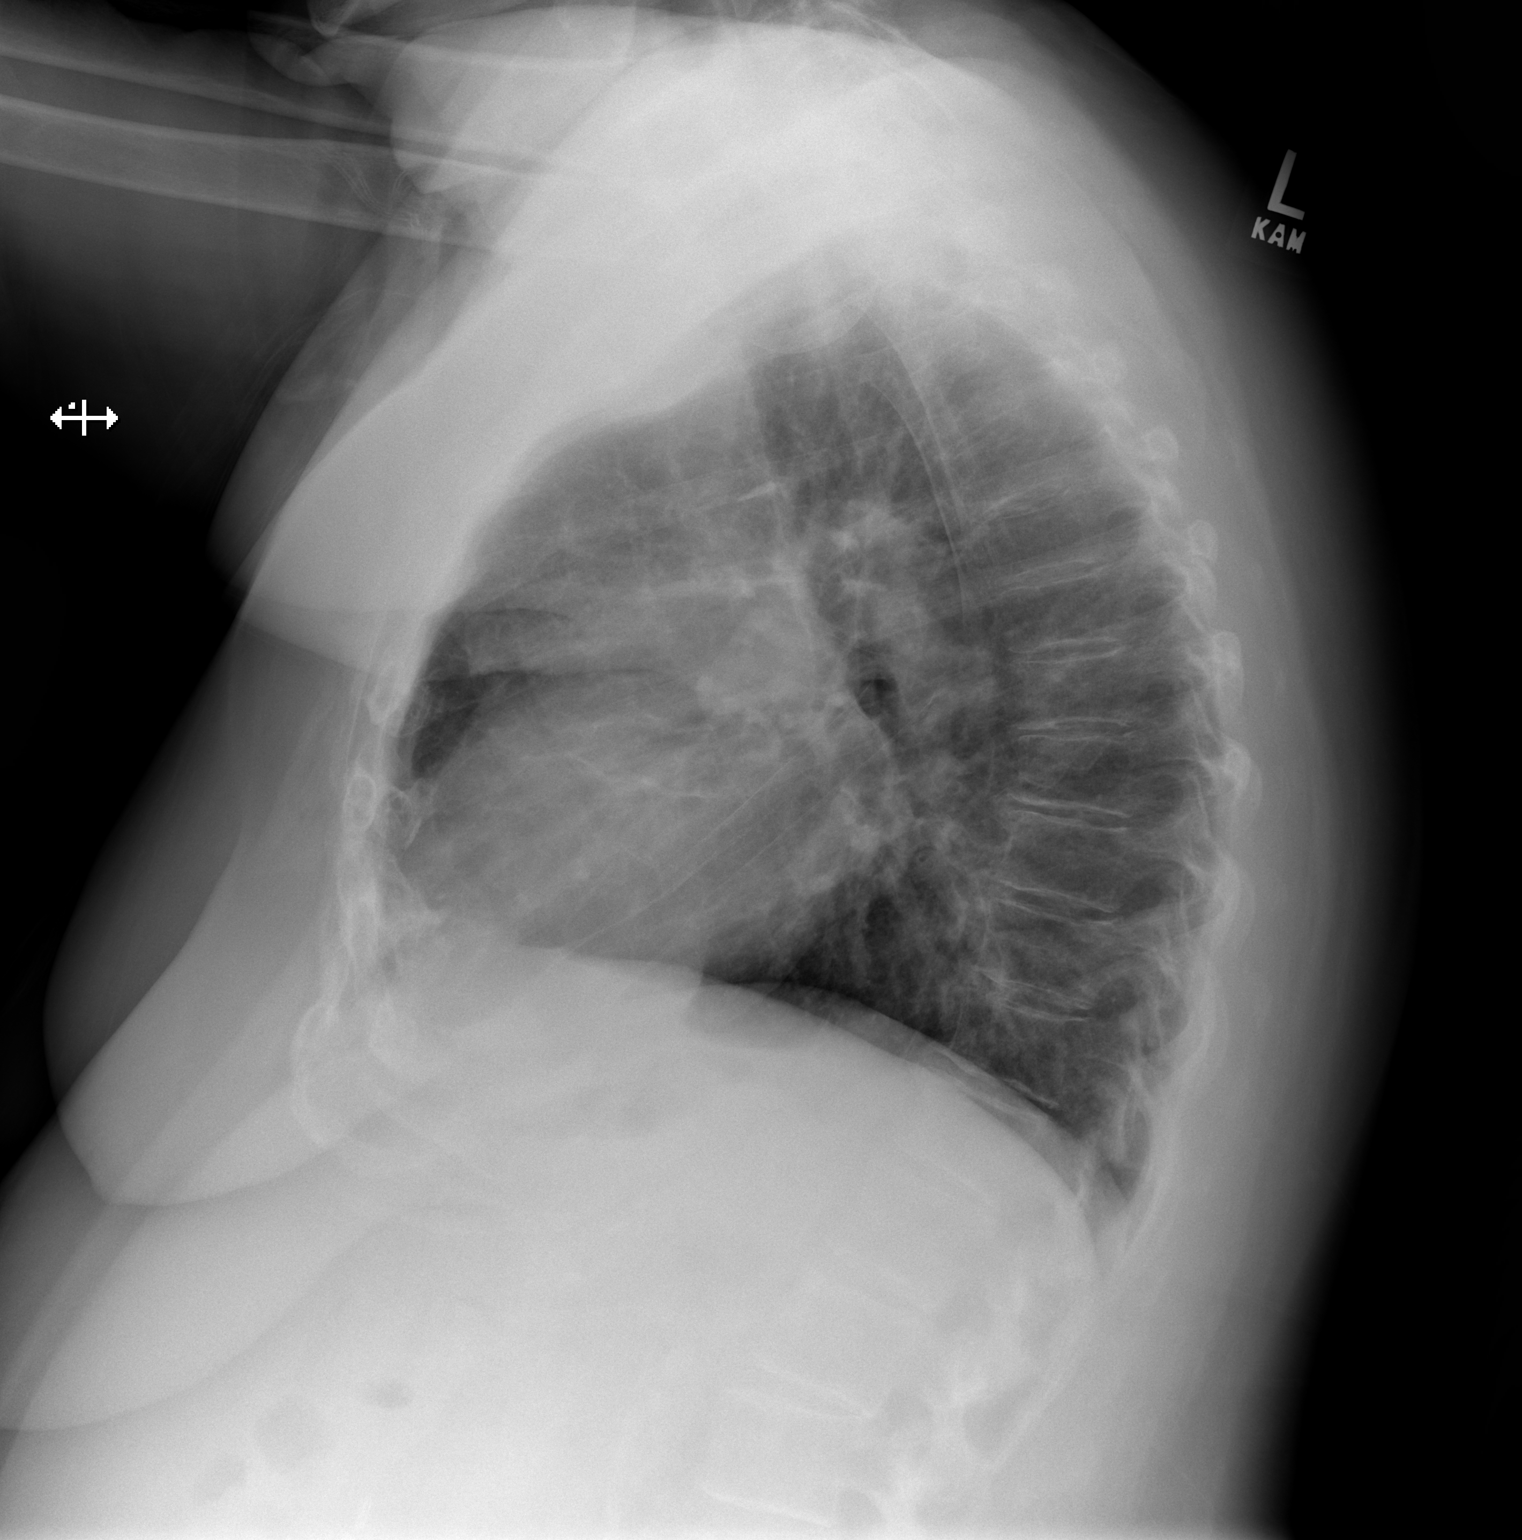

[2 of 2 positions shown; findings below may reference images not displayed]

FINDINGS: The heart size appears improved from prior study. Aortic
calcifications are noted. There is some atelectasis versus scarring
at the lung bases. There is no pneumothorax. No large pleural
effusion. No acute osseous abnormality.
IMPRESSION: No active cardiopulmonary disease.

## 2020-04-11 NOTE — Progress Notes (Signed)
Cardiology Clinic Note   Patient Name: [name] Date of Encounter: 04/12/2020  Primary Care Provider:  Hoyt Koch, MD Primary Cardiologist:  Sanda Klein, MD  Patient Profile    [name] 69 year old female presents today for an evaluation of her shortness of breath and chronic systolic heart failure  Past Medical History    Past Medical History:  Diagnosis Date  . Anemia, unspecified   . Arthritis   . Asthma   . Atrial fibrillation with RVR ([place])    a. on Xarelto  . Bell's palsy    Facial nerve decompression in 2001  . CHF (congestive heart failure) ([place])   . Chronic low back pain   . COPD with asthma ([place])   . Coronary artery disease    Myoview 04/12/11 was entirely normal. ECHO 02/26/08 showed only minor abnormalities. Stenting 05/26/08 of her posterolateral branch to the left circumflex coronary artery. Used a 2.5x74mm Taxus Monorail stent.myoview 2014 was without ischemia  . Diabetes mellitus    Type 2  . Early cataracts, bilateral   . Fatty liver   . GERD (gastroesophageal reflux disease)   . Glaucoma   . Glaucoma   . Goiter   . Heart murmur   . History of nuclear stress test 2012; 2014   lexiscan; normal pattern of perfusion; normal, low risk scan   . Hyperlipidemia   . Hypertension   . Panic disorder   . Pneumonia 2008  . Polycystic ovary disease    Hysterectomy in 1982 for this  . Shortness of breath dyspnea    ECHO 02/26/08 showed only minor abnormalities  . Spinal stenosis    Past Surgical History:  Procedure Laterality Date  . ABDOMINAL HYSTERECTOMY  1982   & BSO; for polycystic ovary disease  . CARDIOVERSION N/A 12/17/2013   Procedure: CARDIOVERSION;  Surgeon: Pixie Casino, MD;  Location: [facility] ENDOSCOPY;  Service: Cardiovascular;  Laterality: N/A;  . CARDIOVERSION N/A 09/30/2015   Procedure: CARDIOVERSION;  Surgeon: Dorothy Spark, MD;  Location: [facility] ENDOSCOPY;  Service: Cardiovascular;  Laterality: N/A;  . CARDIOVERSION  N/A 06/22/2016   Procedure: CARDIOVERSION;  Surgeon: Skeet Latch, MD;  Location: [facility] ENDOSCOPY;  Service: Cardiovascular;  Laterality: N/A;  . CENTRAL LINE INSERTION  01/13/2019   Procedure: CENTRAL LINE INSERTION;  Surgeon: Jolaine Artist, MD;  Location: [place] CV LAB;  Service: Cardiovascular;;  . COLONOSCOPY     last 2009; Dr Cristina Gong; due 2019  . COLONOSCOPY WITH PROPOFOL N/A 01/23/2018   Procedure: COLONOSCOPY WITH PROPOFOL;  Surgeon: Ronald Lobo, MD;  Location: [place];  Service: Endoscopy;  Laterality: N/A;  . CORONARY ANGIOPLASTY  05/26/2008   Stenting of her posterolateral branch to the left circumflex coronary artery. Used a 2.5x30mm Taxus Monorail stent.  . ESOPHAGOGASTRODUODENOSCOPY (EGD) WITH PROPOFOL N/A 01/23/2018   Procedure: ESOPHAGOGASTRODUODENOSCOPY (EGD) WITH PROPOFOL;  Surgeon: Ronald Lobo, MD;  Location: [place];  Service: Endoscopy;  Laterality: N/A;  . FACIAL NERVE DECOMPRESSION  2001/2002   bells palsy   . LAPAROSCOPIC CHOLECYSTECTOMY  06/15/2011    Dr Dalbert Batman  . PERICARDIOCENTESIS N/A 01/13/2019   Procedure: PERICARDIOCENTESIS;  Surgeon: Burnell Blanks, MD;  Location: [place] CV LAB;  Service: Cardiovascular;  Laterality: N/A;  . RIGHT AND LEFT HEART CATH N/A 01/13/2019   Procedure: RIGHT AND LEFT HEART CATH;  Surgeon: Jolaine Artist, MD;  Location: [place] CV LAB;  Service: Cardiovascular;  Laterality: N/A;  . TEE WITHOUT CARDIOVERSION N/A 12/17/2013  Procedure: TRANSESOPHAGEAL ECHOCARDIOGRAM (TEE);  Surgeon: Pixie Casino, MD;  Location: [facility] ENDOSCOPY;  Service: Cardiovascular;  Laterality: N/A;  trish/ja  . TRANSTHORACIC ECHOCARDIOGRAM  5/64/3329   LV systolic function normal with mild conc LVH; LA mildly dilated; trace MR/TR  . UPPER GI ENDOSCOPY  2009   negative    Allergies  Allergies  Allergen Reactions  . Benadryl [Diphenhydramine Hcl] Other (See Comments)    Restless leg  . Clopidogrel Bisulfate Nausea  Only    Nausea & pain  . Contrast Media [Iodinated Diagnostic Agents] Palpitations    Rapid heart rate, hot    History of Present Illness    [name] has a past medical history of type 2 diabetes, essential hypertension, coronary artery disease status post PCI (posterior lateral ventricular branch stent 2009) persistent atrial fibrillation, essential hypertension, pericardiocentesis for hemothorax with tamponade 6/20, while on treatment with anticoagulation.  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  Echocardiogram 12/20 showed an LVEF of 45-50%.  Echocardiogram 6/20 showed an EF of 60%.  She underwent nuclear stress test in 2012, 2014, and 2016 which showed normal perfusion patterns.  Her chest x-ray 2/21 showed cardiomegaly, pleural effusions or heart failure findings.  She was last seen by Dr. Loletha Grayer on 11/25/2019.  During that time she indicated that she had not been doing well for around 5 weeks.  She noticed distention in her abdomen and increased shortness of breath with activity.  This was a known heart failure symptoms in the past.  She did not have lower extremity edema.  Her weight had gone up around 5-6 pounds (182 on her home scale and 188 on office scale).  She denied orthopnea and PND.  She was cardiac unaware and denied chest pain.  She did however monitor her heart rate and noticed that over the past several weeks her heart rate had been as high as 124.  Her ECG records were reviewed and her ventricular rate was typically from 100-1 15 range through her office visits in 2020.  She presented the clinic today after contacting the nurse triage line 03/07/2020, indicating that she had increased shortness of breath and had a weight gain of 5 to 6 pounds.  She stated she was beginning to feel much better than she did yesterday.  She reviewed her medications and realized that she had taken a double dose of her metoprolol succinate instead of taking 2 tablets of her torsemide.  She noticed that  her heart rate gradually improved throughout the day and by the evening she was feeling somewhat normal.  She was planning a better medication delivery system and did not feel this area will happen again.  She stated that she did maintain some dizziness but was very careful and had not had any falls.  I will ordered a BMP  and planned follow-up for reevaluation in 1 month.  Her BMP showed a sodium of 120 and a creatinine that was elevated to 1.44 with a GFR of 37.  Her torsemide was decreased to 20 mg twice daily.  Fluid restriction and low-sodium diet were discussed.  She was instructed to follow-up with her PCP.  Repeat BMP showed slight improvement in sodium and creatinine of 1.26.  She presents to the clinic today for follow-up evaluation and states she feels fairly well.  She states that she continues to wear self daily and has noticed a slight fluctuation in her weight since decreasing her torsemide.  We discussed as needed dosing of her  torsemide and will have her take an extra dose for a 3 pound weight gain overnight or 5 pound weight gain in 1 week.  We have set her dry weight at around 177 pounds.  She continues to be followed by her PCP for her hyponatremia.  She states she went to her pulmonologist who has now prescribed nighttime oxygen.  I will have her follow-up with Dr. Sallyanne Kuster in 6 months.  Today she denies chest pain, shortness of breath, lower extremity edema, fatigue, palpitations, melena, hematuria, hemoptysis, diaphoresis, weakness, presyncope, syncope, orthopnea, and PND.   Home Medications    Prior to Admission medications   Medication Sig Start Date End Date Taking? Authorizing Provider  ascorbic acid (VITAMIN C) 1000 MG tablet Take 1,000 mg by mouth daily.     [provider]  benzonatate (TESSALON) 200 MG capsule Take 1 capsule (200 mg total) by mouth 3 (three) times daily as needed for cough. 03/28/20   Deneise Lever, MD  CALCIUM-MAG-VIT C-VIT D PO Take 1 tablet  by mouth daily.     [provider]  celecoxib (CELEBREX) 200 MG capsule TAKE (1) CAPSULE TWICE DAILY. 03/21/20   Hoyt Koch, MD  colestipol (COLESTID) 1 g tablet Take 1 g by mouth 2 (two) times daily. 02/10/20   [provider]  Continuous Blood Gluc Sensor ([place]) MISC Use to monitor sugars 01/06/20   Hoyt Koch, MD  diltiazem (CARDIZEM CD) 180 MG 24 hr capsule TAKE 1 CAPSULE DAILY. 12/22/19   Lorretta Harp, MD  famotidine (PEPCID) 20 MG tablet Take 20 mg by mouth 2 (two) times daily. 02/10/20   [provider]  gabapentin (NEURONTIN) 100 MG capsule TAKE (1) CAPSULE THREE TIMES DAILY. 03/03/20   Hoyt Koch, MD  guaiFENesin (MUCINEX) 600 MG 12 hr tablet Take 600 mg by mouth 2 (two) times daily as needed for cough.     [provider]  HYDROcodone-acetaminophen (NORCO/VICODIN) 5-325 MG tablet TAKE ONE TABLET AT BEDTIME. 03/30/20   Biagio Borg, MD  INVOKANA 100 MG TABS tablet TAKE 1 TABLET ONCE DAILY. 11/10/19   Hoyt Koch, MD  latanoprost (XALATAN) 0.005 % ophthalmic solution Place 1 drop into both eyes at bedtime. 12/09/13   [provider]  LORazepam (ATIVAN) 0.5 MG tablet TAKE 1 OR 2 TABLETS AT BEDTIME. 03/30/20   Marrian Salvage, FNP  metoprolol succinate (TOPROL-XL) 100 MG 24 hr tablet Take 1 tablet (100 mg total) by mouth 2 (two) times daily. Take with or immediately following a meal. 12/07/19   Croitoru, Mihai, MD  metoprolol tartrate (LOPRESSOR) 25 MG tablet Take one tablet daily as needed for rapid palpitations. 12/07/19   Croitoru, Mihai, MD  Multiple Vitamins-Iron (MULTIVITAMINS WITH IRON) TABS Take 1 tablet by mouth daily.     [provider]  nystatin ointment (MYCOSTATIN) Apply 1 application topically 2 (two) times daily as needed (IRRITATION).  07/09/16   [provider]  omeprazole (PRILOSEC) 20 MG capsule Take 1 capsule (20 mg total) by mouth 2 (two)  times daily before a meal. 12/31/18   Vann, Tomi Bamberger, DO  ONETOUCH DELICA LANCETS FINE MISC 1 Units by Does not apply route as directed. 10/05/11   Midge Minium, MD  PARoxetine (PAXIL) 10 MG tablet TAKE 2 AND 1/2 TABS DAILY 01/19/20   Hoyt Koch, MD  potassium chloride (MICRO-K) 10 MEQ CR capsule TAKE (2) CAPSULES DAILY. 10/23/19   Croitoru, Dani Gobble, MD  quinapril (ACCUPRIL) 40 MG tablet Take 1 tablet (40 mg total) by mouth daily. 12/07/19   Croitoru, Mihai, MD  rOPINIRole (REQUIP XL) 2 MG 24 hr tablet Take 1 tablet (2 mg total) by mouth at bedtime. 11/02/19   Dohmeier, Asencion Partridge, MD  rosuvastatin (CRESTOR) 10 MG tablet Take 1 tablet (10 mg total) by mouth daily. 12/07/19 03/06/20  Croitoru, Mihai, MD  senna (SENOKOT) 8.6 MG TABS Take 1 tablet by mouth at bedtime.     [provider]  sitaGLIPtin-metformin (JANUMET) 50-1000 MG tablet TAKE 1/2 TABLET IN THE MORNING WITH BREAKFAST AND 1 TABLET IN THE EVENING WITH SUPPER. 05/29/19   Hoyt Koch, MD  STIOLTO RESPIMAT 2.5-2.5 MCG/ACT AERS INHALE 2 PUFFS INTO LUNGS ONCE DAILY. 12/28/19   Baird Lyons D, MD  torsemide (DEMADEX) 20 MG tablet TAKE (2) TABLETS TWICE DAILY. 03/31/20   Bensimhon, Shaune Pascal, MD  traZODone (DESYREL) 100 MG tablet TAKE 1/2 TO 1 TABLET AT BEDTIME. 07/30/19   Hoyt Koch, MD  XARELTO 20 MG TABS tablet TAKE 1 TABLET ONCE DAILY WITH SUPPER. 01/12/20   Croitoru, Dani Gobble, MD    Family History    Family History  Problem Relation Age of Onset  . Heart attack Father 14       2nd MI at 53  . Colon cancer Brother 43  . Gout Brother   . Ulcers Mother   . Emphysema Mother 16  . Colon polyps Sister   . Cancer Sister        Basal cell carcinoma  . Pneumonia Maternal Grandmother   . Hypertension Brother   . Hyperlipidemia Brother   . Cancer Brother        Skin  . Diabetes Neg Hx   . Stroke Neg Hx    She indicated that her mother is deceased. She indicated that her father is deceased. She indicated  that her sister is alive. She indicated that both of her brothers are alive. She indicated that her maternal grandmother is deceased. She indicated that her maternal grandfather is deceased. She indicated that her paternal grandmother is deceased. She indicated that her paternal grandfather is deceased. She indicated that the status of her neg hx is unknown.  Social History    Social History   Socioeconomic History  . Marital status: Married    Spouse name: Not on file  . Number of children: 2  . Years of education: master's  . Highest education level: Not on file  Occupational History  . Occupation: Electrical engineer    Employer: [employer]  Tobacco Use  . Smoking status: Former Smoker    Packs/day: 1.50    Years: 30.00    Pack years: 45.00    Types: Cigarettes    Quit date: 08/13/1993    Years since quitting: 26.6  . Smokeless tobacco: Never Used  Vaping Use  . Vaping Use: Never used  Substance and Sexual Activity  . Alcohol use: No  . Drug use: No  . Sexual activity: Not on file  Other Topics Concern  . Not on file  Social History Narrative  . Not on file   Social Determinants of Health   Financial Resource Strain:   . Difficulty of Paying Living Expenses: Not on file  Food Insecurity:   . Worried About Charity fundraiser in the Last Year: Not on file  . Ran Out of Food in the Last Year: Not on file  Transportation Needs:   .  Lack of Transportation (Medical): Not on file  . Lack of Transportation (Non-Medical): Not on file  Physical Activity:   . Days of Exercise per Week: Not on file  . Minutes of Exercise per Session: Not on file  Stress:   . Feeling of Stress : Not on file  Social Connections:   . Frequency of Communication with Friends and Family: Not on file  . Frequency of Social Gatherings with Friends and Family: Not on file  . Attends Religious Services: Not on file  . Active Member of Clubs or Organizations: Not on file  . Attends Theatre manager Meetings: Not on file  . Marital Status: Not on file  Intimate Partner Violence:   . Fear of Current or Ex-Partner: Not on file  . Emotionally Abused: Not on file  . Physically Abused: Not on file  . Sexually Abused: Not on file     Review of Systems    General:  No chills, fever, night sweats or weight changes.  Cardiovascular:  No chest pain, dyspnea on exertion, edema, orthopnea, palpitations, paroxysmal nocturnal dyspnea. Dermatological: No rash, lesions/masses Respiratory: No cough, dyspnea Urologic: No hematuria, dysuria Abdominal:   No nausea, vomiting, diarrhea, bright red blood per rectum, melena, or hematemesis Neurologic:  No visual changes, wkns, changes in mental status. All other systems reviewed and are otherwise negative except as noted above.  Physical Exam    VS:  BP (!) 126/58   Pulse 84   Ht 5\' 2"  (1.575 m)   Wt 182 lb 9.6 oz (82.8 kg)   SpO2 96%   BMI 33.40 kg/m  , BMI Body mass index is 33.4 kg/m. GEN: Well nourished, well developed, in no acute distress. HEENT: normal. Neck: Supple, no JVD, carotid bruits, or masses. Cardiac: RRR, no murmurs, rubs, or gallops. No clubbing, cyanosis, edema.  Radials/DP/PT 2+ and equal bilaterally.  Respiratory:  Respirations regular and unlabored, clear to auscultation bilaterally. GI: Soft, nontender, nondistended, BS + x 4. MS: no deformity or atrophy. Skin: warm and dry, no rash. Neuro:  Strength and sensation are intact. Psych: Normal affect.  Accessory Clinical Findings    Recent Labs: 01/08/2020: ALT 15; Pro B Natriuretic peptide (BNP) 220.0; TSH 1.81 03/14/2020: BUN 21; Creat 1.26; Hemoglobin 10.0; Platelets 191; Potassium 4.5; Sodium 123   Recent Lipid Panel    Component Value Date/Time   CHOL 188 11/17/2019 1534   TRIG 215 (H) 11/17/2019 1534   HDL 43 11/17/2019 1534   CHOLHDL 4.4 11/17/2019 1534   CHOLHDL 2 10/02/2018 1203   VLDL 24.6 10/02/2018 1203   LDLCALC 108 (H) 11/17/2019 1534    LDLDIRECT 143.4 01/23/2008 1316    ECG personally reviewed by me today-none today.  EKG 03/08/2020 atrial fibrillation with slow ventricular response 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 Abnormal septal motion and hypokinesis as well as inferior basal  hypokinesis EF similar to echo done 05/2019 or slightly better . Left  ventricular ejection fraction, by estimation, is 50 to 55%. The left  ventricle has low normal function. The left  ventricle demonstrates regional wall motion abnormalities (see scoring  diagram/findings for description). Left ventricular diastolic parameters  are indeterminate.  2. Right ventricular systolic function is normal. The right ventricular  size is normal.  3. Left atrial size was moderately dilated.  4. Right atrial size was moderately dilated.  5. The mitral valve is degenerative. Trivial mitral valve regurgitation.  No evidence of mitral stenosis.  6. The aortic valve is tricuspid. Aortic valve regurgitation is trivial.  Mild to moderate aortic valve sclerosis/calcification is present, without  any evidence of aortic stenosis.  7. The inferior vena cava is normal in size with greater than 50%  respiratory variability, suggesting right atrial pressure of 3 mmHg.   Comparison(s): 05/19/19 EF 45-50%.   Cardiac catheterization 01/13/2019 Cardiac tamponade 2. Successful pericardiocentesis with removal of 900 cc of bloody fluid  Recommendations: Will leave drain in place for at least 25 hours. Fluid studies to be sent to lab.  Pre-pericardiocentesis  Ao = 120/59 (75) LV = 120/36 RA = 36 RV = 72/40 PA = 74/36 (57) PCW = 44 Fick cardiac output/index = 5.6/2.9 PVR = 2.2 WU FA sat = 100% PA sat = 56% +RV-LV interaction + equalization of diastolic pressures  Post-tap  RA = 18 RV = 75/15 PA =  76/24 (48) PCWP = 26 Fick 8.5/4.4 PVR = 2.5 WU  Assessment: 1. Evidence of pericardial tamponade  2. 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  This is a low risk study.  Nl Lexiscan myoview. Study did not gate secondary to AFIB    Assessment & Plan   1.  Atrial fibrillation with slow ventricular response-heart rate today 84 bpm.  Patient presented to the clinic on 03/08/2020 and had realized that she had taken an extra dose of her metoprolol. Continue Xarelto Heart healthy  diet Increase physical activity as tolerated  Chronic systolic CHF-euvolemic today.  Much more energy today.  No DOE or activity intolerance.  Continue torsemide, potassium, diltiazem, metoprolol May take an extra dose of 20 mg torsemide for a weight increase of 3 pounds overnight or 5 pounds in 1 week from 177 pounds. Heart healthy diet Increase physical activity as tolerated Continue weight log Continue fluid restriction  Coronary artery disease-no chest pain today.  Cardiac catheterization 2009 with stent to posterior lateral ventricular branch.  Normal nuclear stress test 2016. Continue rosuvastatin, metoprolol, diltiazem Heart healthy diet Increase physical activity as tolerated  Essential hypertension-BP today  126/58.  Well-controlled at home Continue metoprolol, diltiazem, torsemide Heart healthy  Diet Increase physical activity as tolerated  Hyperlipidemia-11/17/2019: Cholesterol, Total 188; HDL 43; LDL Chol Calc (NIH) 108; Triglycerides 215 Continue rosuvastatin Heart healthy  high-fiber diet.   Increase physical activity as tolerated  Diabetes mellitus type 2-A1c 7.9 on 01/08/2020 Continue SGL 2 inhibitor, Metformin, sitagliptin Heart healthy diet Increase physical activity as tolerated Monitored by PCP   Hyponatremia-sodium on last draw 123.  Her torsemide was decreased to 20 mg twice daily Followed by PCP   Disposition: Follow-up with Dr. Sallyanne Kuster in 6 months.  Janice Ng. Quinlynn Cuthbert NP-C    04/12/2020, 3:34 PM [address] [place] [address] Office [phone] Fax [phone]  Notice: This dictation was prepared with Dragon dictation along with smaller phrase technology. Any transcriptional errors that result from this process are unintentional and may not be corrected upon review.

## 2020-04-12 ENCOUNTER — Ambulatory Visit: Payer: PPO | Admitting: General Practice

## 2020-04-12 ENCOUNTER — Encounter: Payer: Self-pay | Admitting: General Practice

## 2020-04-12 ENCOUNTER — Other Ambulatory Visit: Payer: Self-pay

## 2020-04-12 VITALS — BP 126/58 | HR 84 | Ht 62.0 in | Wt 182.6 lb

## 2020-04-12 DIAGNOSIS — E871 Hypo-osmolality and hyponatremia: Secondary | ICD-10-CM | POA: Diagnosis not present

## 2020-04-12 DIAGNOSIS — E669 Obesity, unspecified: Secondary | ICD-10-CM

## 2020-04-12 DIAGNOSIS — I2510 Atherosclerotic heart disease of native coronary artery without angina pectoris: Secondary | ICD-10-CM

## 2020-04-12 DIAGNOSIS — I5022 Chronic systolic (congestive) heart failure: Secondary | ICD-10-CM

## 2020-04-12 DIAGNOSIS — E1169 Type 2 diabetes mellitus with other specified complication: Secondary | ICD-10-CM

## 2020-04-12 DIAGNOSIS — I10 Essential (primary) hypertension: Secondary | ICD-10-CM

## 2020-04-12 DIAGNOSIS — I4821 Permanent atrial fibrillation: Secondary | ICD-10-CM

## 2020-04-12 DIAGNOSIS — E785 Hyperlipidemia, unspecified: Secondary | ICD-10-CM

## 2020-04-12 MED ORDER — METOPROLOL TARTRATE 25 MG PO TABS: ORAL_TABLET | ORAL | 3 refills | Status: DC

## 2020-04-12 NOTE — Progress Notes (Signed)
Thanks, glad she is better

## 2020-04-12 NOTE — Patient Instructions (Signed)
Medication Instructions:  Ok to take an extra 20 mg tablet of Torsemide for weight gain of 3 lbs over night or 5 lbs in a week  *If you need a refill on your cardiac medications before your next appointment, please call your pharmacy*   Lab Work: None Ordered   Testing/Procedures: None Ordered   Follow-Up: At [facility], you and your health needs are our priority.  As part of our continuing mission to provide you with exceptional heart care, we have created designated Provider Care Teams.  These Care Teams include your primary Cardiologist (physician) and Advanced Practice Providers (APPs -  Physician Assistants and Nurse Practitioners) who all work together to provide you with the care you need, when you need it.  We recommend signing up for the patient portal called "MyChart".  Sign up information is provided on this After Visit Summary.  MyChart is used to connect with patients for Virtual Visits (Telemedicine).  Patients are able to view lab/test results, encounter notes, upcoming appointments, etc.  Non-urgent messages can be sent to your provider as well.   To learn more about what you can do with MyChart, go to [email or web address].    Your next appointment:   6 month(s)  The format for your next appointment:   In Person  Provider:   You may see Sanda Klein, MD or one of the following Advanced Practice Providers on your designated Care Team:    Almyra Deforest, PA-C  Fabian Sharp, PA-C or   Roby Lofts, [place]

## 2020-04-20 ENCOUNTER — Other Ambulatory Visit: Payer: Self-pay | Admitting: Internal Medicine

## 2020-04-27 ENCOUNTER — Other Ambulatory Visit: Payer: Self-pay | Admitting: Internal Medicine

## 2020-05-01 ENCOUNTER — Encounter: Payer: Self-pay | Admitting: Internal Medicine

## 2020-05-02 ENCOUNTER — Other Ambulatory Visit: Payer: Self-pay | Admitting: Family

## 2020-05-02 MED ORDER — LORAZEPAM 0.5 MG PO TABS: ORAL_TABLET | ORAL | 0 refills | Status: DC

## 2020-05-02 NOTE — Telephone Encounter (Signed)
Sent by Mickel Baas

## 2020-05-11 ENCOUNTER — Other Ambulatory Visit: Payer: Self-pay | Admitting: Internal Medicine

## 2020-05-11 ENCOUNTER — Other Ambulatory Visit: Payer: Self-pay | Admitting: Cardiovascular Disease

## 2020-05-11 NOTE — Telephone Encounter (Signed)
Done erx 

## 2020-05-12 MED ORDER — HYDROCODONE-ACETAMINOPHEN 5-325 MG PO TABS: 1.0000 | ORAL_TABLET | Freq: Every day | ORAL | 0 refills | Status: DC

## 2020-05-22 ENCOUNTER — Other Ambulatory Visit: Payer: Self-pay | Admitting: Internal Medicine

## 2020-05-23 ENCOUNTER — Other Ambulatory Visit: Payer: Self-pay | Admitting: Family

## 2020-05-23 MED ORDER — LORAZEPAM 0.5 MG PO TABS: ORAL_TABLET | ORAL | 0 refills | Status: DC

## 2020-05-31 ENCOUNTER — Telehealth: Payer: Self-pay | Admitting: Internal Medicine

## 2020-05-31 NOTE — Telephone Encounter (Signed)
Spoke to [employer] at [place].  Pt has to have ov, ono and order all within 30 days.  Pt had ov on 5/10 and ono order was placed then but pt did not want to do test then and requested it be put off. [facility] has documented several attempts to contact pt.  Another ono order was placed on 7/7 and pt had test done then but no ov within the 30 days.  Pt had another ov on 8/16 and order was put in for O2.  Gilda called in on 8/17 and left a phone message that pt would have to have another ono due to older than 30 days.  Another ono order was placed on 8/19 but pt has not had it done.  Pt will have to have another ov and ono test and then order placed - all to be done within 30 days.

## 2020-05-31 NOTE — Telephone Encounter (Signed)
LMTCB x1 for pt.  

## 2020-06-03 NOTE — Telephone Encounter (Signed)
Lm x2 for patient. Letter has been mailed to address on file per office protocol.   

## 2020-06-09 ENCOUNTER — Telehealth: Payer: Self-pay | Admitting: Internal Medicine

## 2020-06-09 NOTE — Telephone Encounter (Signed)
LMTCB x1 for pt.  

## 2020-06-16 NOTE — Telephone Encounter (Signed)
lmtcb for pt.  

## 2020-06-17 NOTE — Telephone Encounter (Signed)
Will close encounter per office protocol.  2 unsuccessful attempts to contact patient.

## 2020-06-20 ENCOUNTER — Encounter: Payer: Self-pay | Admitting: Podiatry

## 2020-06-20 ENCOUNTER — Ambulatory Visit: Payer: PPO | Admitting: Podiatry

## 2020-06-20 ENCOUNTER — Other Ambulatory Visit: Payer: Self-pay

## 2020-06-20 ENCOUNTER — Ambulatory Visit: Payer: PPO | Admitting: Orthotics

## 2020-06-20 ENCOUNTER — Ambulatory Visit (INDEPENDENT_AMBULATORY_CARE_PROVIDER_SITE_OTHER): Payer: PPO

## 2020-06-20 DIAGNOSIS — M79671 Pain in right foot: Secondary | ICD-10-CM | POA: Diagnosis not present

## 2020-06-20 DIAGNOSIS — M79672 Pain in left foot: Secondary | ICD-10-CM

## 2020-06-20 DIAGNOSIS — Q6650 Congenital pes planus, unspecified foot: Secondary | ICD-10-CM

## 2020-06-20 DIAGNOSIS — M778 Other enthesopathies, not elsewhere classified: Secondary | ICD-10-CM | POA: Diagnosis not present

## 2020-06-20 DIAGNOSIS — M7751 Other enthesopathy of right foot: Secondary | ICD-10-CM | POA: Diagnosis not present

## 2020-06-20 DIAGNOSIS — E1142 Type 2 diabetes mellitus with diabetic polyneuropathy: Secondary | ICD-10-CM

## 2020-06-20 NOTE — Progress Notes (Signed)

## 2020-06-24 ENCOUNTER — Telehealth: Payer: Self-pay | Admitting: Student

## 2020-06-24 NOTE — Telephone Encounter (Signed)
Lm for patient.   Please see 05/31/2020 phone note.

## 2020-06-27 ENCOUNTER — Other Ambulatory Visit: Payer: Self-pay | Admitting: Internal Medicine

## 2020-06-27 ENCOUNTER — Other Ambulatory Visit: Payer: Self-pay | Admitting: Family

## 2020-06-27 ENCOUNTER — Other Ambulatory Visit: Payer: Self-pay | Admitting: Cardiovascular Disease

## 2020-06-27 NOTE — Progress Notes (Signed)
Subjective:   Patient ID: [name], female   DOB: 69 y.o.   MRN: [id number]   HPI Patient presents stating she is having a lot of pain in the dorsum of both feet and its been 10 months since she has been here   ROS      Objective:  Physical Exam  Neurovascular status intact with exquisite extensor tendinitis bilateral is more within the central and lateral portion of the extensor tendon     Assessment:  Extensor tendinitis bilateral     Plan:  Sterile prep injected the extensor tendon complex bilateral 3 mg Kenalog 5 mg Xylocaine advised on topical medicines ice therapy and shoes that do not put too much pressure on her foot

## 2020-06-27 NOTE — Telephone Encounter (Signed)
Rx has been sent to the pharmacy electronically. ° °

## 2020-06-28 ENCOUNTER — Other Ambulatory Visit: Payer: Self-pay | Admitting: Podiatry

## 2020-06-28 DIAGNOSIS — M778 Other enthesopathies, not elsewhere classified: Secondary | ICD-10-CM

## 2020-07-04 ENCOUNTER — Encounter: Payer: Self-pay | Admitting: Internal Medicine

## 2020-07-06 ENCOUNTER — Telehealth: Payer: Self-pay | Admitting: Internal Medicine

## 2020-07-06 NOTE — Telephone Encounter (Signed)
Patient calling back upset checking on the status of her refill

## 2020-07-06 NOTE — Telephone Encounter (Signed)
sitaGLIPtin-metformin (JANUMET) 50-1000 MG tablet [place], [place] Phone:  [phone]  Fax:  [phone]     Requesting a refill, patient is completely out.

## 2020-07-11 NOTE — Telephone Encounter (Signed)
LVM informing pt that medication refill approved to pharmacy on file; can callback if questions.

## 2020-07-27 ENCOUNTER — Other Ambulatory Visit: Payer: Self-pay | Admitting: Internal Medicine

## 2020-07-29 NOTE — Progress Notes (Signed)
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 Echo 11/20/2018- EF 55-60%, mild RAE Walk Test 12/21/19- room air- lowest O2 sat 97%, max HR 126. NPSG 08/15/2017- AHI 0/ hr. desat to 89%/ mean 93%, PLMA 23.7/ hr, body weight 194 lbs ONOX 89% or less  For over 17 minutes ([facility]) ------------------------------------------------------------------------------  03/28/20- 15yoF former smoker last seen in 0960 for COPD , complicated by AV4/ neuropathy, AFib/ Xarelto, CHF,HBP, CAD/PCI, COPD, GERD, Hyperlipidemia, Restless Legs, Obesity, Glaucoma Hosp in June, 2020 with pericardial tamponade, cardiogenic shock, CHF -----CHF, SOB ONOX 89% or less  For over 17 minutes ([facility])> to start sleep O2 Sleeping in recliner Stiolto helps. Cough, mostly dry. Realizes weight increase related to fluid retention after she reduced diuretic.  Comes today in wheelchair after hurting leg.   08/01/20- 69yoF former smoker last seen in 0981 for COPD , complicated by XB1/ neuropathy, AFib/ Xarelto, CHF,HBP, CAD/PCI, COPD, GERD, Hyperlipidemia, Restless Legs, Obesity, Glaucoma O2 sleep 2l/ [facility]   Not started yet- needs OV today, then ONOX, then order for O2 all w/in 30 days -Stiolto 2.5 Respimat Covid vax- 3 Phizers Flu vax- had Arrival RA sat 98% today PFT ordered in August- not done C/O dry hacking cough, not wheezing. More shortness of breath, maybe- not acute. Stiolto some help.  CXR 12/22/19-  IMPRESSION: 1.  No radiographic evidence of acute cardiopulmonary disease. 2. Aortic atherosclerosis.  ROS-see HPI   + = positive Constitutional:    weight loss+ last year, night sweats, fevers, chills, fatigue, lassitude. HEENT:    headaches, difficulty swallowing, tooth/dental problems, sore throat,        sneezing, itching, ear ache, nasal congestion, post nasal drip, snoring CV:    chest pain, orthopnea, PND, +swelling in lower extremities, anasarca,                                  dizziness, +palpitations Resp:   +shortness of breath with exertion or at rest.                productive cough,   +non-productive cough, coughing up of blood.              change in color of mucus.  wheezing.   Skin:    rash or lesions. GI:  No-   heartburn, indigestion, abdominal pain, nausea, vomiting, diarrhea,                 change in bowel habits, lo+ss of appetite GU: dysuria, change in color of urine, no urgency or frequency.   flank pain. MS:   joint pain, stiffness, decreased range of motion, back pain. Neuro-     nothing unusual Psych:  change in mood or affect.  depression or anxiety.   memory loss.  OBJ- Physical Exam General- Alert, Oriented, Affect-appropriate, Distress- none acute, obese+ Skin- rash-none, lesions- none, excoriation- none Lymphadenopathy- none Head- atraumatic            Eyes- Gross vision intact, PERRLA, conjunctivae and secretions clear            Ears- Hearing, canals-normal            Nose- Clear, no-Septal dev, mucus, polyps, erosion, perforation  Throat- Mallampati II , mucosa clear , drainage- none, tonsils- atrophic Neck- flexible , trachea midline, no stridor , thyroid nl, carotid no bruit Chest - symmetrical excursion , unlabored           Heart/CV- IRR/AFib+ , no murmur , no gallop  , no rub, nl s1 s2                           - JVD- none , edema- none, stasis changes+, varices-+ superficial           Lung- clear to P&A/ no rales, wheeze- none, cough- none , dullness-none, rub- none           Chest wall-  Abd-  Br/ Gen/ Rectal- Not done, not indicated Extrem- cyanosis- none, clubbing, none, atrophy- none, strength- nl Neuro- grossly intact to observation

## 2020-08-01 ENCOUNTER — Other Ambulatory Visit: Payer: Self-pay

## 2020-08-01 ENCOUNTER — Ambulatory Visit: Payer: PPO | Admitting: Internal Medicine

## 2020-08-01 ENCOUNTER — Encounter: Payer: Self-pay | Admitting: Internal Medicine

## 2020-08-01 VITALS — BP 110/60 | HR 78 | Temp 98.1°F | Ht 62.0 in | Wt 187.2 lb

## 2020-08-01 DIAGNOSIS — J9611 Chronic respiratory failure with hypoxia: Secondary | ICD-10-CM

## 2020-08-01 DIAGNOSIS — J449 Chronic obstructive pulmonary disease, unspecified: Secondary | ICD-10-CM

## 2020-08-01 DIAGNOSIS — G4734 Idiopathic sleep related nonobstructive alveolar hypoventilation: Secondary | ICD-10-CM | POA: Diagnosis not present

## 2020-08-01 MED ORDER — ANORO ELLIPTA 62.5-25 MCG/INH IN AEPB
1.0000 | INHALATION_SPRAY | Freq: Every day | RESPIRATORY_TRACT | 0 refills | Status: DC
Start: 2020-08-01 — End: 2020-08-22

## 2020-08-01 NOTE — Patient Instructions (Addendum)
Order- schedule PFT   Dx COPD mixed type  Order- Overnight oximetry on room air       Please call us for results after this is done, to help [place] stay in time-window for coverage if you do need oxygen.  Sample x 2 Anoro inhaler.  Inhale 1 puff, once daily.  Try this instead of Stiolto. 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   Please call if we can help

## 2020-08-07 ENCOUNTER — Other Ambulatory Visit: Payer: Self-pay | Admitting: Internal Medicine

## 2020-08-08 ENCOUNTER — Other Ambulatory Visit: Payer: Self-pay | Admitting: Internal Medicine

## 2020-08-09 DIAGNOSIS — Z1231 Encounter for screening mammogram for malignant neoplasm of breast: Secondary | ICD-10-CM | POA: Diagnosis not present

## 2020-08-09 DIAGNOSIS — M810 Age-related osteoporosis without current pathological fracture: Secondary | ICD-10-CM | POA: Diagnosis not present

## 2020-08-09 DIAGNOSIS — Z01419 Encounter for gynecological examination (general) (routine) without abnormal findings: Secondary | ICD-10-CM | POA: Diagnosis not present

## 2020-08-10 ENCOUNTER — Telehealth: Payer: Self-pay | Admitting: Internal Medicine

## 2020-08-10 NOTE — Telephone Encounter (Signed)
atc patient to verify the [name] is working as good as the [employer] before sending in [employer]. Unable to reach left message to call us back so we can place order for Anoro

## 2020-08-11 ENCOUNTER — Other Ambulatory Visit (HOSPITAL_COMMUNITY): Payer: Self-pay | Admitting: Internal Medicine

## 2020-08-15 ENCOUNTER — Other Ambulatory Visit: Payer: Self-pay | Admitting: Internal Medicine

## 2020-08-15 ENCOUNTER — Encounter: Payer: Self-pay | Admitting: Internal Medicine

## 2020-08-15 NOTE — Telephone Encounter (Signed)
lmtcb X2 for pt.  

## 2020-08-16 ENCOUNTER — Other Ambulatory Visit: Payer: Self-pay | Admitting: Family

## 2020-08-16 MED ORDER — HYDROCODONE-ACETAMINOPHEN 5-325 MG PO TABS: 1.0000 | ORAL_TABLET | Freq: Every day | ORAL | 0 refills | Status: DC

## 2020-08-16 NOTE — Telephone Encounter (Signed)
lmtcb for pt. Will close message as we have made several unsuccessful attempts to reach pt with no return call.

## 2020-08-22 ENCOUNTER — Other Ambulatory Visit: Payer: Self-pay | Admitting: Internal Medicine

## 2020-08-22 ENCOUNTER — Encounter: Payer: Self-pay | Admitting: Internal Medicine

## 2020-08-22 MED ORDER — ANORO ELLIPTA 62.5-25 MCG/INH IN AEPB: 1.0000 | INHALATION_SPRAY | Freq: Every day | RESPIRATORY_TRACT | 12 refills | Status: DC

## 2020-08-22 NOTE — Telephone Encounter (Signed)
Refillable script for [employer] sent to [facility]. Stiolto removed from med list.

## 2020-08-22 NOTE — Telephone Encounter (Signed)
Please advise on mychart message from patient  Hi I did leave a message with a nurse/staff that I do want to switch to the new Anoro inhaler.  Also please remember to add it to my formulary. Thanks  [employer]

## 2020-08-26 ENCOUNTER — Other Ambulatory Visit (HOSPITAL_COMMUNITY): Payer: PPO

## 2020-08-27 ENCOUNTER — Other Ambulatory Visit: Payer: Self-pay | Admitting: Internal Medicine

## 2020-08-30 ENCOUNTER — Ambulatory Visit: Payer: PPO | Admitting: Primary Care

## 2020-08-30 ENCOUNTER — Ambulatory Visit: Payer: PPO | Admitting: Adult Health

## 2020-08-31 ENCOUNTER — Other Ambulatory Visit: Payer: Self-pay | Admitting: Neurology

## 2020-09-01 DIAGNOSIS — J9611 Chronic respiratory failure with hypoxia: Secondary | ICD-10-CM | POA: Insufficient documentation

## 2020-09-01 NOTE — Assessment & Plan Note (Addendum)
Dyspnea multiple causes- obesity and cardiac as well as pulmonary. Plan- PFT. Try samples Anoro, which may be better covered than Stiolto by her insurance

## 2020-09-01 NOTE — Assessment & Plan Note (Signed)
O2 for sleep had been ordered through [facility], but needing to meet requirement that order be placed within 30 days of study. Plan- update ONOX anticipating reorder of sleep O2 through [facility].

## 2020-09-04 ENCOUNTER — Encounter: Payer: Self-pay | Admitting: Internal Medicine

## 2020-09-08 ENCOUNTER — Other Ambulatory Visit: Payer: Self-pay | Admitting: Internal Medicine

## 2020-09-13 ENCOUNTER — Telehealth: Payer: Self-pay | Admitting: Internal Medicine

## 2020-09-13 NOTE — Telephone Encounter (Signed)
Per pt's chart the order was sent to [facility] and they confirmed receipt. Called [facility] and spoke to [place] and was advised they did not do an ONO on pt.   lmtcb for pt to see who did her ONO.

## 2020-09-14 ENCOUNTER — Other Ambulatory Visit: Payer: Self-pay | Admitting: Cardiovascular Disease

## 2020-09-14 ENCOUNTER — Telehealth: Payer: Self-pay | Admitting: Internal Medicine

## 2020-09-14 NOTE — Telephone Encounter (Signed)
LVM for pt to rtn my call to schedule awv with nha. Please schedule appt if pt calls the office.  

## 2020-09-19 ENCOUNTER — Telehealth: Payer: Self-pay | Admitting: Podiatry

## 2020-09-19 ENCOUNTER — Ambulatory Visit (INDEPENDENT_AMBULATORY_CARE_PROVIDER_SITE_OTHER): Payer: HMO | Admitting: Podiatry

## 2020-09-19 ENCOUNTER — Encounter: Payer: Self-pay | Admitting: Podiatry

## 2020-09-19 ENCOUNTER — Other Ambulatory Visit: Payer: Self-pay

## 2020-09-19 DIAGNOSIS — Z800 Family history of malignant neoplasm of digestive organs: Secondary | ICD-10-CM | POA: Insufficient documentation

## 2020-09-19 DIAGNOSIS — R195 Other fecal abnormalities: Secondary | ICD-10-CM | POA: Insufficient documentation

## 2020-09-19 DIAGNOSIS — R1010 Upper abdominal pain, unspecified: Secondary | ICD-10-CM | POA: Insufficient documentation

## 2020-09-19 DIAGNOSIS — M2141 Flat foot [pes planus] (acquired), right foot: Secondary | ICD-10-CM | POA: Diagnosis not present

## 2020-09-19 DIAGNOSIS — M2011 Hallux valgus (acquired), right foot: Secondary | ICD-10-CM

## 2020-09-19 DIAGNOSIS — E1142 Type 2 diabetes mellitus with diabetic polyneuropathy: Secondary | ICD-10-CM

## 2020-09-19 DIAGNOSIS — M2142 Flat foot [pes planus] (acquired), left foot: Secondary | ICD-10-CM

## 2020-09-19 DIAGNOSIS — E119 Type 2 diabetes mellitus without complications: Secondary | ICD-10-CM | POA: Diagnosis not present

## 2020-09-19 DIAGNOSIS — M79675 Pain in left toe(s): Secondary | ICD-10-CM

## 2020-09-19 DIAGNOSIS — B351 Tinea unguium: Secondary | ICD-10-CM

## 2020-09-19 DIAGNOSIS — M2012 Hallux valgus (acquired), left foot: Secondary | ICD-10-CM | POA: Diagnosis not present

## 2020-09-19 DIAGNOSIS — M79674 Pain in right toe(s): Secondary | ICD-10-CM

## 2020-09-19 NOTE — Progress Notes (Signed)
ANNUAL DIABETIC FOOT EXAM  Subjective: [name] presents today for for annual diabetic foot examination and painful thick toenails that are difficult to trim. Pain interferes with ambulation. Aggravating factors include wearing enclosed shoe gear. Pain is relieved with periodic professional debridement..  Patient relates 24 year h/o diabetes.  Patient denies any h/o foot wounds.  Patient admits to symptoms of foot numbness.  Patient admits symptoms of foot tingling.  Patient admits some symptoms of burning in feet.  Patient states she does not routinely monitor her blood glucose, but should be monitoring twice daily.  She has h/o plantar fasciitis and left 5th metatarsal fracture.   Hoyt Koch, MD is patient's PCP. Last visit was 03/14/2020.  Past Medical History:  Diagnosis Date  . Anemia, unspecified   . Arthritis   . Asthma   . Atrial fibrillation with RVR ([place])    a. on Xarelto  . Bell's palsy    Facial nerve decompression in 2001  . CHF (congestive heart failure) ([place])   . Chronic low back pain   . COPD with asthma ([place])   . Coronary artery disease    Myoview 04/12/11 was entirely normal. ECHO 02/26/08 showed only minor abnormalities. Stenting 05/26/08 of her posterolateral branch to the left circumflex coronary artery. Used a 2.5x68m Taxus Monorail stent.myoview 2014 was without ischemia  . Diabetes mellitus    Type 2  . Early cataracts, bilateral   . Fatty liver   . GERD (gastroesophageal reflux disease)   . Glaucoma   . Glaucoma   . Goiter   . Heart murmur   . History of nuclear stress test 2012; 2014   lexiscan; normal pattern of perfusion; normal, low risk scan   . Hyperlipidemia   . Hypertension   . Panic disorder   . Pneumonia 2008  . Polycystic ovary disease    Hysterectomy in 1982 for this  . Shortness of breath dyspnea    ECHO 02/26/08 showed only minor abnormalities  . Spinal stenosis    Patient Active Problem List   Diagnosis  Date Noted  . Weakness of both lower extremities 09/23/2020  . Abnormal feces 09/19/2020  . Family history of malignant neoplasm of gastrointestinal tract 09/19/2020  . Upper abdominal pain 09/19/2020  . Chronic respiratory failure with hypoxia ([place] 09/01/2020  . Nocturnal hypoxemia 04/05/2020  . New daily persistent headache 01/08/2020  . Deep vein thrombosis (DVT) of calf muscle vein of left lower extremity ([place] 11/02/2019  . Obesity hypoventilation syndrome ([place] 11/02/2019  . Coagulopathy ([place] 11/02/2019  . Coronary artery disease involving native coronary artery of native heart with angina pectoris ([place] 11/02/2019  . Bradycardia 01/12/2019  . Pericardial effusion 01/12/2019  . Diarrhea 01/12/2019  . Shortness of breath   . CHF (congestive heart failure) ([place] 01/11/2019  . Hyponatremia 12/30/2018  . Permanent atrial fibrillation ([place] 06/10/2018  . Arthritis 10/25/2017  . COPD mixed type ([place] 10/25/2017  . Fibrocystic disease of breast 10/25/2017  . Gastroesophageal reflux disease 10/25/2017  . Glaucoma 10/25/2017  . 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 Heart murmur 10/25/2017  . Insomnia 10/25/2017  . Osteoporosis 10/25/2017  . RLS (restless legs syndrome) 04/23/2017  . Routine general medical examination at a health care facility 11/16/2016  . Diabetic polyneuropathy ([place] 07/30/2016  . Depression 01/04/2016  . Obesity (BMI 30.0-34.9) 10/11/2015  . CAD S/P percutaneous coronary angioplasty 04/28/2013  . Essential hypertension 05/06/2009  . LOW BACK PAIN, CHRONIC 02/12/2008  . Anemia 08/15/2007  . Type 2  diabetes mellitus with vascular disease ([place]) 01/02/2007  . Hyperlipidemia associated with type 2 diabetes mellitus ([place]) 01/02/2007  . ELEVATION, TRANSAMINASE/LDH LEVELS 01/02/2007   Past Surgical History:  Procedure Laterality Date  . ABDOMINAL HYSTERECTOMY  1982   & BSO; for polycystic ovary disease  . CARDIOVERSION N/A 12/17/2013   Procedure: CARDIOVERSION;  Surgeon:  Pixie Casino, MD;  Location: [facility] ENDOSCOPY;  Service: Cardiovascular;  Laterality: N/A;  . CARDIOVERSION N/A 09/30/2015   Procedure: CARDIOVERSION;  Surgeon: Dorothy Spark, MD;  Location: [facility] ENDOSCOPY;  Service: Cardiovascular;  Laterality: N/A;  . CARDIOVERSION N/A 06/22/2016   Procedure: CARDIOVERSION;  Surgeon: Skeet Latch, MD;  Location: [facility] ENDOSCOPY;  Service: Cardiovascular;  Laterality: N/A;  . CENTRAL LINE INSERTION  01/13/2019   Procedure: CENTRAL LINE INSERTION;  Surgeon: Jolaine Artist, MD;  Location: [place] CV LAB;  Service: Cardiovascular;;  . COLONOSCOPY     last 2009; Dr Cristina Gong; due 2019  . COLONOSCOPY WITH PROPOFOL N/A 01/23/2018   Procedure: COLONOSCOPY WITH PROPOFOL;  Surgeon: Ronald Lobo, MD;  Location: [place];  Service: Endoscopy;  Laterality: N/A;  . CORONARY ANGIOPLASTY  05/26/2008   Stenting of her posterolateral branch to the left circumflex coronary artery. Used a 2.5x63m Taxus Monorail stent.  . ESOPHAGOGASTRODUODENOSCOPY (EGD) WITH PROPOFOL N/A 01/23/2018   Procedure: ESOPHAGOGASTRODUODENOSCOPY (EGD) WITH PROPOFOL;  Surgeon: BRonald Lobo MD;  [place]  Service: Endoscopy;  Laterality: N/A;  . FACIAL NERVE DECOMPRESSION  2001/2002   bells palsy   . LAPAROSCOPIC CHOLECYSTECTOMY  06/15/2011    Dr IDalbert Batman . PERICARDIOCENTESIS N/A 01/13/2019   Procedure: PERICARDIOCENTESIS;  Surgeon: MBurnell Blanks MD;  [facility];  Service: Cardiovascular;  Laterality: N/A;  . RIGHT AND LEFT HEART CATH N/A 01/13/2019   Procedure: RIGHT AND LEFT HEART CATH;  Surgeon: BJolaine Artist MD;  [place];  Service: Cardiovascular;  Laterality: N/A;  . TEE WITHOUT CARDIOVERSION N/A 12/17/2013   Procedure: TRANSESOPHAGEAL ECHOCARDIOGRAM (TEE);  Surgeon: KPixie Casino MD;  [facility];  Service: Cardiovascular;  Laterality: N/A;  trish/ja  . TRANSTHORACIC ECHOCARDIOGRAM  70/34/9179  LV  systolic function normal with mild conc LVH; LA mildly dilated; trace MR/TR  . UPPER GI ENDOSCOPY  2009   negative   Current Outpatient Medications on File Prior to Visit  Medication Sig Dispense Refill  . amoxicillin (AMOXIL) 875 MG tablet Take 875 mg by mouth 2 (two) times daily.    .Marland Kitchenascorbic acid (VITAMIN C) 1000 MG tablet Take 1,000 mg by mouth daily.    . benzonatate (TESSALON) 200 MG capsule Take by mouth.    .Marland KitchenCALCIUM-MAG-VIT C-VIT D PO Take 1 tablet by mouth daily.    . canagliflozin (INVOKANA) 100 MG TABS tablet Take 1 tablet (100 mg total) by mouth daily. Please call our office to schedule a follow up in order to get additional refills. 30 tablet 0  . celecoxib (CELEBREX) 200 MG capsule TAKE (1) CAPSULE TWICE DAILY. 180 capsule 0  . colestipol (COLESTID) 1 g tablet Take 1 g by mouth 2 (two) times daily.    . Continuous Blood Gluc Sensor (FREESTYLE LIBRE 14 DAY SENSOR) MISC FreeStyle Libre 14 Day Sensor kit    . diltiazem (CARDIZEM CD) 180 MG 24 hr capsule TAKE 1 CAPSULE DAILY. 90 capsule 3  . famotidine (PEPCID) 20 MG tablet Take 20 mg by mouth 2 (two) times daily.    .Marland Kitchengabapentin (NEURONTIN) 100 MG capsule  TAKE (1) CAPSULE THREE TIMES DAILY. 180 capsule 0  . guaiFENesin (MUCINEX) 600 MG 12 hr tablet Take 600 mg by mouth 2 (two) times daily as needed for cough.     [name] HYDROcodone-acetaminophen (NORCO/VICODIN) 5-325 MG tablet Take 1 tablet by mouth at bedtime. 30 tablet 0  . isosorbide mononitrate (IMDUR) 30 MG 24 hr tablet 1/2 tablet    . latanoprost (XALATAN) 0.005 % ophthalmic solution Place 1 drop into both eyes at bedtime.    . levalbuterol (XOPENEX) 0.63 MG/3ML nebulizer solution 3 ml    . LORazepam (ATIVAN) 0.5 MG tablet TAKE 1 OR 2 TABLETS AT BEDTIME. 60 tablet 5  . metoprolol succinate (TOPROL-XL) 100 MG 24 hr tablet TAKE ONE TABLET TWICE A DAY WITH OR IMMEDIATELY FOLLOWING A MEAL. 180 tablet 1  . metoprolol tartrate (LOPRESSOR) 25 MG tablet Take one tablet daily as needed  for rapid palpitations. 30 tablet 3  . Multiple Minerals-Vitamins (CALCIUM CITRATE PLUS/MAGNESIUM PO) 1 tablet    . Multiple Vitamins-Iron (MULTIVITAMINS WITH IRON) TABS Take 1 tablet by mouth daily.    [name] nystatin ointment (MYCOSTATIN) Apply 1 application topically 2 (two) times daily as needed (IRRITATION).   0  . NYSTATIN PO     . omega-3 acid ethyl esters (LOVAZA) 1 g capsule 1 tablet    . omeprazole (PRILOSEC) 20 MG capsule Take 1 capsule (20 mg total) by mouth 2 (two) times daily before a meal.    . omeprazole (PRILOSEC) 20 MG capsule 2 capsule    . ONETOUCH DELICA LANCETS FINE MISC 1 Units by Does not apply route as directed. 100 each 3  . PARoxetine (PAXIL) 10 MG tablet TAKE 2 AND 1/2 TABS DAILY 225 tablet 0  . POTASSIUM BICARBONATE PO 2 tablets    . potassium chloride (MICRO-K) 10 MEQ CR capsule TAKE (2) CAPSULES DAILY. 60 capsule 8  . quinapril (ACCUPRIL) 40 MG tablet TAKE 1 TABLET ONCE DAILY. 90 tablet 1  . rOPINIRole (REQUIP XL) 2 MG 24 hr tablet Take 1 tablet (2 mg total) by mouth at bedtime. 30 tablet 5  . rOPINIRole (REQUIP) 0.5 MG tablet TAKE 1 TO 2 TABLETS AT MIDNIGHT. 60 tablet 1  . rosuvastatin (CRESTOR) 10 MG tablet Take 1 tablet (10 mg total) by mouth daily. 90 tablet 3  . senna (SENOKOT) 8.6 MG TABS Take 1 tablet by mouth at bedtime.    . sitaGLIPtin-metformin (JANUMET) 50-1000 MG tablet Take 1 tablet by mouth 2 (two) times daily with a meal. TAKE 1/2 TABLET IN THE MORNING WITH BREAKFAST AND 1 TABLET IN THE EVENING WITH SUPPER. 45 tablet 7  . torsemide (DEMADEX) 20 MG tablet TAKE (2) TABLETS TWICE DAILY. 360 tablet 0  . traZODone (DESYREL) 100 MG tablet TAKE 1/2 TO 1 TABLET AT BEDTIME. 90 tablet 3  . umeclidinium-vilanterol (ANORO ELLIPTA) 62.5-25 MCG/INH AEPB Inhale 1 puff into the lungs daily. 60 each 12  . XARELTO 20 MG TABS tablet TAKE 1 TABLET ONCE DAILY WITH SUPPER. 90 tablet 3   No current facility-administered medications on file prior to visit.    Allergies   Allergen Reactions  . Benadryl [Diphenhydramine Hcl] Other (See Comments)    Restless leg  . Clopidogrel Bisulfate Nausea Only    Nausea & pain  . Contrast Media [Iodinated Diagnostic Agents] Palpitations    Rapid heart rate, hot   Social History   Occupational History  . Occupation: Electrical engineer    Employer: [employer]  Tobacco Use  .  Smoking status: Former Smoker    Packs/day: 1.50    Years: 30.00    Pack years: 45.00    Types: Cigarettes    Quit date: 08/13/1993    Years since quitting: 27.1  . Smokeless tobacco: Never Used  Vaping Use  . Vaping Use: Never used  Substance and Sexual Activity  . Alcohol use: No  . Drug use: No  . Sexual activity: Not on file   Family History  Problem Relation Age of Onset  . Heart attack Father 78       2nd MI at 83  . Colon cancer Brother 78  . Gout Brother   . Ulcers Mother   . Emphysema Mother 38  . Colon polyps Sister   . Cancer Sister        Basal cell carcinoma  . Pneumonia Maternal Grandmother   . Hypertension Brother   . Hyperlipidemia Brother   . Cancer Brother        Skin  . Diabetes Neg Hx   . Stroke Neg Hx    Immunization History  Administered Date(s) Administered  . Fluad Quad(high Dose 65+) 07/06/2019, 07/18/2020  . Influenza Whole 05/18/2009, 05/12/2010  . Influenza, High Dose Seasonal PF 07/30/2016, 04/23/2017, 06/09/2018  . Influenza,inj,Quad PF,6+ Mos 08/03/2015  . Influenza,inj,quad, With Preservative 05/14/2015  . PFIZER(Purple Top)SARS-COV-2 Vaccination 09/19/2019, 10/14/2019, 07/05/2020  . Pneumococcal Conjugate-13 06/09/2018     Review of Systems: Negative except as noted in the HPI.  Objective: There were no vitals filed for this visit.  [name] is a pleasant 70 y.o. female in NAD. AAO X 3.  Vascular Examination: Capillary fill time to digits <3 seconds b/l lower extremities. Palpable pedal pulses b/l LE. Pedal hair sparse. Lower extremity skin temperature gradient  within normal limits. Varicosities present b/l.  Dermatological Examination: Pedal skin with normal turgor, texture and tone bilaterally. No open wounds bilaterally. No interdigital macerations bilaterally. Toenails 2-5 bilaterally elongated, discolored, dystrophic, thickened, and crumbly with subungual debris and tenderness to dorsal palpation. Anonychia noted L hallux and R hallux. Nailbed(s) epithelialized.   Musculoskeletal Examination: Normal muscle strength 5/5 to all lower extremity muscle groups bilaterally. No pain crepitus or joint limitation noted with ROM b/l. Hallux valgus with bunion deformity noted b/l lower extremities. Pes planus deformity noted b/l.   Footwear Assessment: Does the patient wear appropriate shoes? No. Does the patient need inserts/orthotics? Yes.  Neurological Examination: Protective sensation intact 5/5 intact bilaterally with 10g monofilament b/l. Vibratory sensation intact b/l.  Hemoglobin A1C Latest Ref Rng & Units 01/08/2020  HGBA1C 4.6 - 6.5 % 7.9(H)  Some recent data might be hidden   Assessment: 1. Pain due to onychomycosis of toenails of both feet   2. Hallux valgus, acquired, bilateral   3. Pes planus of both feet   4. Diabetic polyneuropathy associated with type 2 diabetes mellitus ([place])   5. Encounter for diabetic foot exam ([place])      ADA Risk Categorization: Low Risk :  Patient has all of the following: Intact protective sensation No prior foot ulcer  No severe deformity Pedal pulses present  Plan: -Examined patient. -Diabetic foot examination performed on today's visit. -Continue diabetic foot care principles. -Patient to continue soft, supportive shoe gear daily. -Patient to continue soft, supportive shoe gear daily. Start procedure for diabetic shoes. Patient qualifies based on diagnoses. -Toenails 2-5 bilaterally debrided in length and girth without iatrogenic bleeding with sterile nail nipper and dremel.  -Patient to report  any pedal  injuries to medical professional immediately. -Patient/POA to call should there be question/concern in the interim.  Return in about 3 months (around 12/17/2020).  Marzetta Board, DPM

## 2020-09-19 NOTE — Telephone Encounter (Signed)
Pt called asking if she could pick up her shoes today while she was here.  Upon reviewing the authorizations have expired.I explained to pt that we have to get documents again and hta auth again so we would not be able to dispense shoes today.

## 2020-09-20 NOTE — Telephone Encounter (Signed)
Lmtcb for pt.  

## 2020-09-23 ENCOUNTER — Ambulatory Visit (INDEPENDENT_AMBULATORY_CARE_PROVIDER_SITE_OTHER): Payer: HMO

## 2020-09-23 ENCOUNTER — Ambulatory Visit (INDEPENDENT_AMBULATORY_CARE_PROVIDER_SITE_OTHER): Payer: HMO | Admitting: Internal Medicine

## 2020-09-23 ENCOUNTER — Encounter: Payer: Self-pay | Admitting: Internal Medicine

## 2020-09-23 ENCOUNTER — Other Ambulatory Visit: Payer: Self-pay

## 2020-09-23 VITALS — BP 138/70 | HR 103 | Temp 97.9°F | Resp 18 | Ht 62.0 in | Wt 187.4 lb

## 2020-09-23 DIAGNOSIS — E1159 Type 2 diabetes mellitus with other circulatory complications: Secondary | ICD-10-CM

## 2020-09-23 DIAGNOSIS — M810 Age-related osteoporosis without current pathological fracture: Secondary | ICD-10-CM | POA: Diagnosis not present

## 2020-09-23 DIAGNOSIS — I509 Heart failure, unspecified: Secondary | ICD-10-CM

## 2020-09-23 DIAGNOSIS — R0602 Shortness of breath: Secondary | ICD-10-CM

## 2020-09-23 DIAGNOSIS — R29898 Other symptoms and signs involving the musculoskeletal system: Secondary | ICD-10-CM

## 2020-09-23 DIAGNOSIS — M545 Low back pain, unspecified: Secondary | ICD-10-CM | POA: Diagnosis not present

## 2020-09-23 DIAGNOSIS — I10 Essential (primary) hypertension: Secondary | ICD-10-CM | POA: Diagnosis not present

## 2020-09-23 LAB — COMPREHENSIVE METABOLIC PANEL
ALT: 8 U/L (ref 0–35)
AST: 17 U/L (ref 0–37)
Albumin: 4.1 g/dL (ref 3.5–5.2)
Alkaline Phosphatase: 60 U/L (ref 39–117)
BUN: 22 mg/dL (ref 6–23)
CO2: 29 mEq/L (ref 19–32)
Calcium: 8.7 mg/dL (ref 8.4–10.5)
Chloride: 91 mEq/L — ABNORMAL LOW (ref 96–112)
Creatinine, Ser: 1.29 mg/dL — ABNORMAL HIGH (ref 0.40–1.20)
GFR: 41.64 mL/min — ABNORMAL LOW (ref >60.00)
Glucose, Bld: 150 mg/dL — ABNORMAL HIGH (ref 70–99)
Potassium: 4 mEq/L (ref 3.5–5.1)
Sodium: 128 mEq/L — ABNORMAL LOW (ref 135–145)
Total Bilirubin: 0.4 mg/dL (ref 0.2–1.2)
Total Protein: 7.5 g/dL (ref 6.0–8.3)

## 2020-09-23 LAB — BRAIN NATRIURETIC PEPTIDE: Pro B Natriuretic peptide (BNP): 224 pg/mL — ABNORMAL HIGH (ref 0.0–100.0)

## 2020-09-23 LAB — LIPID PANEL
Cholesterol: 104 mg/dL (ref 0–200)
HDL: 38.1 mg/dL — ABNORMAL LOW (ref >39.00)
LDL Cholesterol: 36 mg/dL (ref 0–99)
NonHDL: 65.84
Total CHOL/HDL Ratio: 3
Triglycerides: 150 mg/dL — ABNORMAL HIGH (ref 0.0–149.0)
VLDL: 30 mg/dL (ref 0.0–40.0)

## 2020-09-23 LAB — TSH: TSH: 3.1 u[IU]/mL (ref 0.35–4.50)

## 2020-09-23 LAB — FERRITIN: Ferritin: 10.2 ng/mL (ref 10.0–291.0)

## 2020-09-23 LAB — CBC
HCT: 25.8 % — ABNORMAL LOW (ref 36.0–46.0)
Hemoglobin: 8.7 g/dL — ABNORMAL LOW (ref 12.0–15.0)
MCHC: 33.8 g/dL (ref 30.0–36.0)
MCV: 78.5 fl (ref 78.0–100.0)
Platelets: 221 10*3/uL (ref 150.0–400.0)
RBC: 3.29 Mil/uL — ABNORMAL LOW (ref 3.87–5.11)
RDW: 14.1 % (ref 11.5–15.5)
WBC: 7.4 10*3/uL (ref 4.0–10.5)

## 2020-09-23 LAB — VITAMIN D 25 HYDROXY (VIT D DEFICIENCY, FRACTURES): VITD: 50.26 ng/mL (ref 30.00–100.00)

## 2020-09-23 LAB — VITAMIN B12: Vitamin B-12: 247 pg/mL (ref 211–911)

## 2020-09-23 NOTE — Assessment & Plan Note (Signed)
Getting PFTs this week to assess lung function. May benefit from cardiac rehab.

## 2020-09-23 NOTE — Progress Notes (Signed)
   Subjective:   Patient ID: [name], female    DOB: February 12, 1951, 70 y.o.   MRN: [id number]  HPI The patient is a 70 YO female coming in for worsening SOB on exertion (she is having PFTs Thursday this week, has done cardiac rehab in the past, not sure if her iron level is low, denies chest pains, needing to rest more often and less energy overall), and needs follow up diabetes (taking janumet and invokana, denies low sugars, no recent HgA1c) and new osteoporosis (taken with ob/gyn, lowest -3.2, they recommended she ask [place] about prolia, she is concerned as last dexa 5 years ago was normal, denies recent falls or fractures).   Review of Systems  Constitutional: Positive for activity change and fatigue.  HENT: Negative.   Eyes: Negative.   Respiratory: Positive for shortness of breath. Negative for cough and chest tightness.   Cardiovascular: Negative for chest pain, palpitations and leg swelling.  Gastrointestinal: Negative for abdominal distention, abdominal pain, constipation, diarrhea, nausea and vomiting.  Musculoskeletal: Negative.   Skin: Negative.   Neurological: Negative.   Psychiatric/Behavioral: Negative.     Objective:  Physical Exam Constitutional:      Appearance: She is well-developed and well-nourished. She is obese.  HENT:     Head: Normocephalic and atraumatic.  Eyes:     Extraocular Movements: EOM normal.  Cardiovascular:     Rate and Rhythm: Normal rate. Rhythm irregular.  Pulmonary:     Effort: Pulmonary effort is normal. No respiratory distress.     Breath sounds: Normal breath sounds. No wheezing or rales.  Abdominal:     General: Bowel sounds are normal. There is no distension.     Palpations: Abdomen is soft.     Tenderness: There is no abdominal tenderness. There is no rebound.  Musculoskeletal:        General: No edema.     Cervical back: Normal range of motion.  Skin:    General: Skin is warm and dry.  Neurological:     Mental Status: She is  alert and oriented to person, place, and time.     Coordination: Coordination normal.  Psychiatric:        Mood and Affect: Mood and affect normal.     Vitals:   09/23/20 1527  BP: 138/70  Pulse: (!) 103  Resp: 18  Temp: 97.9 F (36.6 C)  TempSrc: Oral  SpO2: 98%  Weight: 187 lb 6.4 oz (85 kg)  Height: 5\' 2"  (1.575 m)   This visit occurred during the SARS-CoV-2 public health emergency.  Safety protocols were in place, including screening questions prior to the visit, additional usage of staff PPE, and extensive cleaning of exam room while observing appropriate contact time as indicated for disinfecting solutions.   Assessment & Plan:

## 2020-09-23 NOTE — Assessment & Plan Note (Signed)
Checking HgA1c and adjust jentadueto and invokana as needed.

## 2020-09-23 NOTE — Assessment & Plan Note (Signed)
Checking CMP and CBC and BNP. If labs normal would recommend consideration of cardiac rehab for stamina.

## 2020-09-23 NOTE — Patient Instructions (Signed)
We will check the labs and the x-ray today and get you in for the back.

## 2020-09-23 NOTE — Assessment & Plan Note (Signed)
Did discuss prolia and she wants to hold off for now. Since we do not have scan do not have access to information to assess FRAX risk score.

## 2020-09-23 NOTE — Assessment & Plan Note (Signed)
Checking x-ray lower back to assess. If normal will recommend PT as first initiate. If severe arthritis needs neurosurgery referral.

## 2020-09-26 ENCOUNTER — Encounter: Payer: Self-pay | Admitting: Internal Medicine

## 2020-09-26 DIAGNOSIS — R29898 Other symptoms and signs involving the musculoskeletal system: Secondary | ICD-10-CM

## 2020-09-26 LAB — HEMOGLOBIN A1C: Hgb A1c MFr Bld: 7.3 % — ABNORMAL HIGH (ref 4.6–6.5)

## 2020-09-27 ENCOUNTER — Other Ambulatory Visit (HOSPITAL_COMMUNITY): Payer: PPO

## 2020-09-27 ENCOUNTER — Other Ambulatory Visit (HOSPITAL_COMMUNITY)
Admission: RE | Admit: 2020-09-27 | Discharge: 2020-09-27 | Disposition: A | Discharge status: Home / Self Care | Payer: HMO | Source: Ambulatory Visit | Attending: Internal Medicine | Admitting: Internal Medicine

## 2020-09-27 DIAGNOSIS — Z20822 Contact with and (suspected) exposure to covid-19: Secondary | ICD-10-CM | POA: Insufficient documentation

## 2020-09-27 DIAGNOSIS — Z01812 Encounter for preprocedural laboratory examination: Secondary | ICD-10-CM | POA: Diagnosis not present

## 2020-09-27 LAB — SARS CORONAVIRUS 2 (TAT 6-24 HRS): SARS Coronavirus 2: NEGATIVE

## 2020-09-29 ENCOUNTER — Telehealth: Payer: Self-pay | Admitting: Podiatry

## 2020-09-29 NOTE — Telephone Encounter (Signed)
Left message for pt that we did get auth for the diabetic shoes/inserts and to call to schedule an appt to pick them up.Marland KitchenMarland Kitchen

## 2020-09-30 ENCOUNTER — Encounter: Payer: Self-pay | Admitting: Adult Health

## 2020-09-30 ENCOUNTER — Encounter: Payer: Self-pay | Admitting: Internal Medicine

## 2020-09-30 ENCOUNTER — Ambulatory Visit (INDEPENDENT_AMBULATORY_CARE_PROVIDER_SITE_OTHER): Payer: HMO

## 2020-09-30 ENCOUNTER — Telehealth: Payer: Self-pay | Admitting: *Deleted

## 2020-09-30 ENCOUNTER — Ambulatory Visit (INDEPENDENT_AMBULATORY_CARE_PROVIDER_SITE_OTHER): Payer: HMO | Admitting: Internal Medicine

## 2020-09-30 ENCOUNTER — Ambulatory Visit: Payer: HMO | Admitting: Adult Health

## 2020-09-30 ENCOUNTER — Other Ambulatory Visit: Payer: Self-pay

## 2020-09-30 VITALS — BP 116/78 | HR 65 | Temp 97.8°F | Ht 61.0 in | Wt 190.0 lb

## 2020-09-30 DIAGNOSIS — J449 Chronic obstructive pulmonary disease, unspecified: Secondary | ICD-10-CM

## 2020-09-30 DIAGNOSIS — I2510 Atherosclerotic heart disease of native coronary artery without angina pectoris: Secondary | ICD-10-CM | POA: Diagnosis not present

## 2020-09-30 DIAGNOSIS — J9611 Chronic respiratory failure with hypoxia: Secondary | ICD-10-CM

## 2020-09-30 DIAGNOSIS — I509 Heart failure, unspecified: Secondary | ICD-10-CM

## 2020-09-30 DIAGNOSIS — I10 Essential (primary) hypertension: Secondary | ICD-10-CM | POA: Diagnosis not present

## 2020-09-30 DIAGNOSIS — G4734 Idiopathic sleep related nonobstructive alveolar hypoventilation: Secondary | ICD-10-CM | POA: Diagnosis not present

## 2020-09-30 LAB — PULMONARY FUNCTION TEST
DL/VA % pred: 65 %
DL/VA: 2.77 ml/min/mmHg/L
DLCO cor % pred: 68 %
DLCO cor: 12.13 ml/min/mmHg
DLCO unc % pred: 55 %
DLCO unc: 9.93 ml/min/mmHg
FEF 25-75 Pre: 1.19 L/sec
FEF2575-%Pred-Pre: 69 %
FEV1-%Pred-Pre: 82 %
FEV1-Pre: 1.66 L
FEV1FVC-%Pred-Pre: 98 %
FEV6-%Pred-Pre: 87 %
FEV6-Pre: 2.22 L
FEV6FVC-%Pred-Pre: 104 %
FVC-%Pred-Pre: 83 %
FVC-Pre: 2.23 L
Pre FEV1/FVC ratio: 74 %
Pre FEV6/FVC Ratio: 100 %

## 2020-09-30 NOTE — Patient Instructions (Addendum)
Continue on Anoro 1 puff daily, rinse after use.  Discuss with your regarding Accupril may be aggravating your cough .  Find results for overnight oximetry test .  Chest xray today .  Follow up with Dr. Annamaria Boots  In 3 months and As needed     Late add : Begin oxygen 2 L at bedtime

## 2020-09-30 NOTE — Telephone Encounter (Signed)
Called and spoke with patient, advised of results of ONO per Tammy Parrett NP and the need to have 2L of oxygen at night.  Advised that I have sent an order to [facility] for her to have oxygen delivered to her home to wear at night.  She verbalized understanding.  Nothing further needed.

## 2020-09-30 NOTE — Assessment & Plan Note (Signed)
Overnight oximetry test results were found.  O2 sats were less than 88% for over 17 minutes.  O2 at 2 L to start at bedtime.

## 2020-09-30 NOTE — Progress Notes (Signed)
_0  ID: [name], female    DOB: 04-Sep-1950, 70 y.o.   MRN: [id number]  Chief Complaint  Patient presents with  . Follow-up    Referring provider: Hoyt Koch, *  HPI: 70 year old female former smoker followed for COPD  Medical history significant for diabetes, A. fib on anticoagulation, congestive heart failure, coronary artery disease  TEST/EVENTS :  2D echo December 09, 2019 showed abnormal septal motion and hypokinesis in the inferior basal EF 50 to 55%, normal right ventricular systolic function.  09/30/2020 Follow up : COPD  Patient presents for a 72-monthfollow-up.  Patient has underlying COPD.  [place] visit [place] [employer]  Pulmonary function testing was done today that showed an FEV1 at 82%, ratio 74, FVC 83%.  DLCO 55%.  Last chest x-ray was Dec 22, 2019 that showed clear lungs. Since last visit patient is feeling some better. Feels ANORO works some better.  Still gets winded with walking long distances. Worse for last 2 months.  Has dry cough . On ACE inhibitor .  Had ONO 2 weeks ago, results showed O2 sats less than 88% for 17 minutes.  Does have anemia , Hgb 8.7 . Following with PCP.   Allergies  Allergen Reactions  . Benadryl [Diphenhydramine Hcl] Other (See Comments)    Restless leg  . Clopidogrel Bisulfate Nausea Only    Nausea & pain  . Contrast Media [Iodinated Diagnostic Agents] Palpitations    Rapid heart rate, hot    Immunization History  Administered Date(s) Administered  . Fluad Quad(high Dose 65+) 07/06/2019, 07/18/2020  . Influenza Whole 05/18/2009, 05/12/2010  . Influenza, High Dose Seasonal PF 07/30/2016, 04/23/2017, 06/09/2018  . Influenza,inj,Quad PF,6+ Mos 08/03/2015  . Influenza,inj,quad, With Preservative 05/14/2015  . PFIZER(Purple Top)SARS-COV-2 Vaccination 09/19/2019, 10/14/2019, 07/05/2020  . Pneumococcal Conjugate-13 06/09/2018    Past Medical History:  Diagnosis Date  . Anemia, unspecified    . Arthritis   . Asthma   . Atrial fibrillation with RVR ([place]    a. on Xarelto  . Bell's palsy    Facial nerve decompression in 2001  . CH[place] ([place]estive heart failure) ([place]   . Chronic low back pain   . [place]   . Coronary artery disease    Myoview 04/12/11 was entirely normal. ECHO 02/26/08 showed only minor abnormalities. Stenting 05/26/08 of her posterolateral branch to the left circumflex coronary artery. Used a 2.5x136mTaxus Monorail stent.myoview 2014 was without ischemia  . Diabetes mellitus    Type 2  . Early cataracts, bilateral   . Fatty liver   . GERD (gastroesophageal reflux disease)   . Glaucoma   . Glaucoma   . Goiter   . Heart murmur   . History of nuclear stress test 2012; 2014   lexiscan; normal pattern of perfusion; normal, low risk scan   . Hyperlipidemia   . Hypertension   . Panic disorder   . Pneumonia 2008  . Polycystic ovary disease    Hysterectomy in 1982 for this  . Shortness of breath dyspnea    ECHO 02/26/08 showed only minor abnormalities  . Spinal stenosis     Tobacco History: Social History   Tobacco Use  Smoking Status Former Smoker  . Packs/day: 1.50  . Years: 30.00  . Pack years: 45.00  . Types: Cigarettes  . Quit date: 08/13/1993  . Years since quitting: 27.1  Smokeless Tobacco Never Used   Counseling given: Not Answered   Outpatient  Medications Prior to Visit  Medication Sig Dispense Refill  . ascorbic acid (VITAMIN C) 1000 MG tablet Take 1,000 mg by mouth daily.    . benzonatate (TESSALON) 200 MG capsule Take by mouth.    [name] CALCIUM-MAG-VIT C-VIT D PO Take 1 tablet by mouth daily.    . canagliflozin (INVOKANA) 100 MG TABS tablet Take 1 tablet (100 mg total) by mouth daily. Please call our office to schedule a follow up in order to get additional refills. 30 tablet 0  . celecoxib (CELEBREX) 200 MG capsule TAKE (1) CAPSULE TWICE DAILY. 180 capsule 0  . Continuous Blood Gluc Sensor (FREESTYLE LIBRE 14 DAY SENSOR)  MISC FreeStyle Libre 14 Day Sensor kit    . diltiazem (CARDIZEM CD) 180 MG 24 hr capsule TAKE 1 CAPSULE DAILY. 90 capsule 3  . famotidine (PEPCID) 20 MG tablet Take 20 mg by mouth 2 (two) times daily.    [name] gabapentin (NEURONTIN) 100 MG capsule TAKE (1) CAPSULE THREE TIMES DAILY. 180 capsule 0  . guaiFENesin (MUCINEX) 600 MG 12 hr tablet Take 600 mg by mouth 2 (two) times daily as needed for cough.     [name] HYDROcodone-acetaminophen (NORCO/VICODIN) 5-325 MG tablet Take 1 tablet by mouth at bedtime. 30 tablet 0  . isosorbide mononitrate (IMDUR) 30 MG 24 hr tablet 1/2 tablet    . latanoprost (XALATAN) 0.005 % ophthalmic solution Place 1 drop into both eyes at bedtime.    . levalbuterol (XOPENEX) 0.63 MG/3ML nebulizer solution 3 ml    . LORazepam (ATIVAN) 0.5 MG tablet TAKE 1 OR 2 TABLETS AT BEDTIME. 60 tablet 5  . metoprolol succinate (TOPROL-XL) 100 MG 24 hr tablet TAKE ONE TABLET TWICE A DAY WITH OR IMMEDIATELY FOLLOWING A MEAL. 180 tablet 1  . metoprolol tartrate (LOPRESSOR) 25 MG tablet Take one tablet daily as needed for rapid palpitations. 30 tablet 3  . Multiple Minerals-Vitamins (CALCIUM CITRATE PLUS/MAGNESIUM PO) 1 tablet    . Multiple Vitamins-Iron (MULTIVITAMINS WITH IRON) TABS Take 1 tablet by mouth daily.    [name] nystatin ointment (MYCOSTATIN) Apply 1 application topically 2 (two) times daily as needed (IRRITATION).   0  . NYSTATIN PO     . omega-3 acid ethyl esters (LOVAZA) 1 g capsule 1 tablet    . omeprazole (PRILOSEC) 20 MG capsule Take 1 capsule (20 mg total) by mouth 2 (two) times daily before a meal.    . omeprazole (PRILOSEC) 20 MG capsule 2 capsule    . ONETOUCH DELICA LANCETS FINE MISC 1 Units by Does not apply route as directed. 100 each 3  . PARoxetine (PAXIL) 10 MG tablet TAKE 2 AND 1/2 TABS DAILY 225 tablet 0  . POTASSIUM BICARBONATE PO 2 tablets    . potassium chloride (MICRO-K) 10 MEQ CR capsule TAKE (2) CAPSULES DAILY. 60 capsule 8  . quinapril (ACCUPRIL) 40 MG tablet TAKE  1 TABLET ONCE DAILY. 90 tablet 1  . rOPINIRole (REQUIP XL) 2 MG 24 hr tablet Take 1 tablet (2 mg total) by mouth at bedtime. 30 tablet 5  . rOPINIRole (REQUIP) 0.5 MG tablet TAKE 1 TO 2 TABLETS AT MIDNIGHT. 60 tablet 1  . senna (SENOKOT) 8.6 MG TABS Take 1 tablet by mouth at bedtime.    . sitaGLIPtin-metformin (JANUMET) 50-1000 MG tablet Take 1 tablet by mouth 2 (two) times daily with a meal. TAKE 1/2 TABLET IN THE MORNING WITH BREAKFAST AND 1 TABLET IN THE EVENING WITH SUPPER. 45 tablet 7  . torsemide (DEMADEX) 20  MG tablet TAKE (2) TABLETS TWICE DAILY. 360 tablet 0  . traZODone (DESYREL) 100 MG tablet TAKE 1/2 TO 1 TABLET AT BEDTIME. 90 tablet 3  . umeclidinium-vilanterol (ANORO ELLIPTA) 62.5-25 MCG/INH AEPB Inhale 1 puff into the lungs daily. 60 each 12  . XARELTO 20 MG TABS tablet TAKE 1 TABLET ONCE DAILY WITH SUPPER. 90 tablet 3  . amoxicillin (AMOXIL) 875 MG tablet Take 875 mg by mouth 2 (two) times daily. (Patient not taking: Reported on 09/30/2020)    . colestipol (COLESTID) 1 g tablet Take 1 g by mouth 2 (two) times daily. (Patient not taking: Reported on 09/30/2020)    . rosuvastatin (CRESTOR) 10 MG tablet Take 1 tablet (10 mg total) by mouth daily. 90 tablet 3   No facility-administered medications prior to visit.     Review of Systems:   Constitutional:   No  weight loss, night sweats,  Fevers, chills,  +fatigue, or  lassitude.  HEENT:   No headaches,  Difficulty swallowing,  Tooth/dental problems, or  Sore throat,                No sneezing, itching, ear ache, nasal congestion, post nasal drip,   CV:  No chest pain,  Orthopnea, PND, swelling in lower extremities, anasarca, dizziness, palpitations, syncope.   GI  No heartburn, indigestion, abdominal pain, nausea, vomiting, diarrhea, change in bowel habits, loss of appetite, bloody stools.   Resp:   No chest wall deformity  Skin: no rash or lesions.  GU: no dysuria, change in color of urine, no urgency or frequency.  No  flank pain, no hematuria   MS:  No joint pain or swelling.  No decreased range of motion.  No back pain.    Physical Exam  BP 116/78 (BP Location: Left Arm, Cuff Size: Large)   Pulse 65   Temp 97.8 F (36.6 C)   Ht $R'5\' 1"'WG$  (1.549 m)   Wt 190 lb (86.2 kg)   SpO2 95%   BMI 35.90 kg/m   GEN: A/Ox3; pleasant , NAD, well nourished    HEENT:  /AT,    THROAT-clear, no lesions, no postnasal drip or exudate noted.   NECK:  Supple w/ fair ROM; no JVD; normal carotid impulses w/o bruits; no thyromegaly or nodules palpated; no lymphadenopathy.    RESP  Clear  P & A; w/o, wheezes/ rales/ or rhonchi. no accessory muscle use, no dullness to percussion  CARD:  RRR, no m/r/g, no peripheral edema, pulses intact, no cyanosis or clubbing.  GI:   Soft & nt; nml bowel sounds; no organomegaly or masses detected.   Musco: Warm bil, no deformities or joint swelling noted.   Neuro: alert, no focal deficits noted.    Skin: Warm, no lesions or rashes    Lab Results:  CBC    Component Value Date/Time   WBC 7.4 09/23/2020 1601   RBC 3.29 (L) 09/23/2020 1601   HGB 8.7 (L) 09/23/2020 1601   HGB 12.0 06/04/2019 1703   HCT 25.8 (L) 09/23/2020 1601   HCT 36.9 06/04/2019 1703   PLT 221.0 09/23/2020 1601   PLT 275 06/04/2019 1703   MCV 78.5 09/23/2020 1601   MCV 76 (L) 06/04/2019 1703   MCH 25.5 (L) 03/14/2020 1617   MCHC 33.8 09/23/2020 1601   RDW 14.1 09/23/2020 1601   RDW 16.3 (H) 06/04/2019 1703   LYMPHSABS 1.0 12/23/2019 1502   MONOABS 0.9 12/23/2019 1502   EOSABS 0.2 12/23/2019 1502   BASOSABS  0.1 12/23/2019 1502    BMET    Component Value Date/Time   NA 128 (L) 09/23/2020 1601   NA 120 (L) 03/08/2020 1207   K 4.0 09/23/2020 1601   CL 91 (L) 09/23/2020 1601   CO2 29 09/23/2020 1601   GLUCOSE 150 (H) 09/23/2020 1601   BUN 22 09/23/2020 1601   BUN 27 03/08/2020 1207   CREATININE 1.29 (H) 09/23/2020 1601   CREATININE 1.26 (H) 03/14/2020 1617   CALCIUM 8.7 09/23/2020 1601    GFRNONAA 37 (L) 03/08/2020 1207   GFRAA 42 (L) 03/08/2020 1207    BNP    Component Value Date/Time   BNP 149.8 (H) 04/02/2019 1533   BNP 317.5 (H) 01/23/2019 1051    ProBNP    Component Value Date/Time   PROBNP 224.0 (H) 09/23/2020 1601    Imaging: DG Lumbar Spine Complete  Result Date: 09/25/2020 CLINICAL DATA:  Pain EXAM: LUMBAR SPINE - COMPLETE 4+ VIEW COMPARISON:  August 04, 2019 FINDINGS: There is no acute compression fracture. Advanced degenerative changes are noted at the L5-S1 level. There is facet arthrosis through the lower lumbar segments. There is no significant malalignment. Advanced vascular calcifications are noted of the abdominal aorta. IMPRESSION: No acute compression fracture or malalignment. Advanced degenerative changes at the L5-S1 level. Electronically Signed   By: Constance Holster M.D.   On: 09/25/2020 15:58      PFT Results Latest Ref Rng & Units 09/30/2020  FVC-Pre L 2.23  FVC-Predicted Pre % 83  Pre FEV1/FVC % % 74  FEV1-Pre L 1.66  FEV1-Predicted Pre % 82  DLCO uncorrected ml/min/mmHg 9.93  DLCO UNC% % 55  DLCO corrected ml/min/mmHg 9.93  DLCO COR %Predicted % 55  DLVA Predicted % 53    No results found for: NITRICOXIDE      Assessment & Plan:   COPD mixed type (HCC) PFT showed no significant restriction or obstruction.  DLCO is decreased however patient is anemic. We will check chest x-ray today.  Continue on Anoro for now.  Patient does have an ongoing chronic cough.  May need ACE inhibitor changed to help with this.  Plan  Patient Instructions  Continue on Anoro 1 puff daily, rinse after use.  Discuss with your regarding Accupril may be aggravating your cough .  Find results for overnight oximetry test .  Chest xray today .  Follow up with Dr. Annamaria Boots  In 3 months and As needed        Chronic respiratory failure with hypoxia ([place]) Overnight oximetry test results were found.  O2 sats were less than 88% for over 17 minutes.   O2 at 2 L to start at bedtime.     Rexene Edison, NP 09/30/2020

## 2020-09-30 NOTE — Progress Notes (Signed)
Spirometry and Dlco done today. 

## 2020-09-30 NOTE — Assessment & Plan Note (Signed)
PFT showed no significant restriction or obstruction.  DLCO is decreased however patient is anemic. We will check chest x-ray today.  Continue on Anoro for now.  Patient does have an ongoing chronic cough.  May need ACE inhibitor changed to help with this.  Plan  Patient Instructions  Continue on Anoro 1 puff daily, rinse after use.  Discuss with your regarding Accupril may be aggravating your cough .  Find results for overnight oximetry test .  Chest xray today .  Follow up with Dr. Annamaria Boots  In 3 months and As needed

## 2020-10-03 ENCOUNTER — Ambulatory Visit: Payer: PPO | Admitting: Internal Medicine

## 2020-10-03 NOTE — Progress Notes (Signed)
ATC x1.  Left VM to return call regarding CXR results.

## 2020-10-04 ENCOUNTER — Telehealth: Payer: Self-pay | Admitting: Internal Medicine

## 2020-10-04 ENCOUNTER — Encounter: Payer: Self-pay | Admitting: Internal Medicine

## 2020-10-04 DIAGNOSIS — J449 Chronic obstructive pulmonary disease, unspecified: Secondary | ICD-10-CM

## 2020-10-04 NOTE — Telephone Encounter (Signed)
Call returned to patient, confirmed DOB. Made aware of CXR results per TP.     Voiced understanding. OK with moving forward with CT Order placed.   Nothing further needed at this time.

## 2020-10-05 NOTE — Progress Notes (Signed)
ATC x2  LVM to return call regarding CXR.

## 2020-10-07 ENCOUNTER — Other Ambulatory Visit: Payer: Self-pay | Admitting: Internal Medicine

## 2020-10-07 DIAGNOSIS — D500 Iron deficiency anemia secondary to blood loss (chronic): Secondary | ICD-10-CM

## 2020-10-07 NOTE — Telephone Encounter (Signed)
I have ordered the iron for her. Can you or Jaton schedule and let patient know. Thanks

## 2020-10-07 NOTE — Progress Notes (Signed)
Pt aware of results per phone note 10/04/20. Will close encounter.

## 2020-10-10 ENCOUNTER — Telehealth: Payer: Self-pay | Admitting: Internal Medicine

## 2020-10-10 ENCOUNTER — Other Ambulatory Visit: Payer: Self-pay | Admitting: Internal Medicine

## 2020-10-10 NOTE — Telephone Encounter (Signed)
CY pt would only like a response from you.  Thank you .

## 2020-10-10 NOTE — Telephone Encounter (Signed)
I answered her questions- She has been very anemic, Hgb 8, which would be sufficient explanation for her DOE. Overnight oximetry on 09/04/20 Did Not show oxygen desaturation to qualify for sleep O2. [facility] was correct that she does not qualify at this time. She did desaturate las summer, but no longer. Her PFT this month showed mild to moderate reduction of DLCO, which may also reflect the anemia. She feels Anoro helps, but PFT didn't show much obstruction.  She had some limited interstitial disease/ scarring on last CXR. Without major concern for PE or ILD, we can afford to watch CXR without CT,  with intention to revisit these issues at her May appointment with me.   Order- 1) D/C order to [facility] for home O2             2) DC order for CT chest

## 2020-10-10 NOTE — Telephone Encounter (Signed)
I have sent the email over to CY to address per pts request.

## 2020-10-10 NOTE — Telephone Encounter (Signed)
[facility] please advise on message from Dr. Annamaria Boots to cancel order sent to [facility]. I have canceled CT already. Thank you   answered her questions- She has been very anemic, Hgb 8, which would be sufficient explanation for her DOE. Overnight oximetry on 09/04/20 Did Not show oxygen desaturation to qualify for sleep O2. [facility] was correct that she does not qualify at this time. She did desaturate las summer, but no longer. Her PFT this month showed mild to moderate reduction of DLCO, which may also reflect the anemia. She feels Anoro helps, but PFT didn't show much obstruction.  She had some limited interstitial disease/ scarring on last CXR. Without major concern for PE or ILD, we can afford to watch CXR without CT,  with intention to revisit these issues at her May appointment with me.   Order- 1) D/C order to [facility] for home O2             2) DC order for CT chest

## 2020-10-11 NOTE — Telephone Encounter (Signed)
I have sent a message to [facility]

## 2020-10-13 ENCOUNTER — Encounter: Payer: Self-pay | Admitting: Internal Medicine

## 2020-10-13 ENCOUNTER — Telehealth: Payer: Self-pay | Admitting: Cardiovascular Disease

## 2020-10-13 NOTE — Telephone Encounter (Signed)
Returned the call to the patient. She stated that on Tuesday she had an episode where her heart was jumping from 45-125. She took her prn Metoprolol and it made her feel better.  She has been complaining of weakness and fatigue and has been diagnosed with low iron and will be getting a transfusion soon.  She stated that she does not want a sooner appointment but will call after she has the transfusion if she is not feeling better.   Appointment made for 11/21/20 at the patient's request.

## 2020-10-13 NOTE — Telephone Encounter (Signed)
Patient sent message to scheduling to schedule an appt with Dr. Sallyanne Kuster for irregular HR, scheduled pt for 04/11 at 2:45pm with Fabian Sharp because she asked for a Monday or Friday afternoon.    "I can do only afternoon appointments.Please check to see if a PA is open. My bp has been steady but the heart ,no.on Tuesday was all over the place from 48 up to 125. Took my extra 25 mg and felt better.  I am having. Iron infusion because it has dropped again to 8 and I am sure is large part of the weakness and very low energy level.  .Thanks for addressing this issue,  [name]"

## 2020-10-17 ENCOUNTER — Encounter: Payer: Self-pay | Admitting: Neurology

## 2020-10-17 ENCOUNTER — Encounter: Payer: Self-pay | Admitting: Internal Medicine

## 2020-10-19 ENCOUNTER — Telehealth: Payer: Self-pay | Admitting: Internal Medicine

## 2020-10-19 NOTE — Telephone Encounter (Signed)
LVM for pt to rtn my call to schedule AWV with [facility]. Please schedule appt if pt calls the office.  

## 2020-10-26 ENCOUNTER — Other Ambulatory Visit: Payer: Self-pay

## 2020-10-26 ENCOUNTER — Non-Acute Institutional Stay (HOSPITAL_COMMUNITY)
Admission: RE | Admit: 2020-10-26 | Discharge: 2020-10-26 | Disposition: A | Discharge status: Home / Self Care | Payer: HMO | Source: Ambulatory Visit | Attending: Internal Medicine | Admitting: Internal Medicine

## 2020-10-26 DIAGNOSIS — D500 Iron deficiency anemia secondary to blood loss (chronic): Secondary | ICD-10-CM | POA: Insufficient documentation

## 2020-10-26 MED ORDER — SODIUM CHLORIDE 0.9 % IV SOLN
25.0000 mg | Freq: Once | INTRAVENOUS | Status: AC
  Administered 2020-10-26: 25 mg via INTRAVENOUS
  Filled 2020-10-26: qty 0.5

## 2020-10-26 MED ORDER — SODIUM CHLORIDE 0.9 % IV SOLN
1000.0000 mg | Freq: Once | INTRAVENOUS | Status: AC
  Administered 2020-10-26: 1000 mg via INTRAVENOUS
  Filled 2020-10-26: qty 20

## 2020-10-26 MED ORDER — SODIUM CHLORIDE 0.9 % IV SOLN
INTRAVENOUS | Status: DC | PRN
  Administered 2020-10-26: 250 mL via INTRAVENOUS

## 2020-10-26 NOTE — Discharge Instructions (Signed)
Iron Dextran injection °What is this medicine? °IRON DEXTRAN (AHY ern DEX tran) is an iron complex. Iron is used to make healthy red blood cells, which carry oxygen and nutrients through the body. This medicine is used to treat people who cannot take iron by mouth and have low levels of iron in the blood. °This medicine may be used for other purposes; ask your health care provider or pharmacist if you have questions. °COMMON BRAND NAME(S): Dexferrum, INFeD °What should I tell my health care provider before I take this medicine? °They need to know if you have any of these conditions: °· anemia not caused by low iron levels °· heart disease °· high levels of iron in the blood °· kidney disease °· liver disease °· an unusual or allergic reaction to iron, other medicines, foods, dyes, or preservatives °· pregnant or trying to get pregnant °· breast-feeding °How should I use this medicine? °This medicine is for injection into a vein or a muscle. It is given by a health care professional in a hospital or clinic setting. °Talk to your pediatrician regarding the use of this medicine in children. While this drug may be prescribed for children as young as 4 months old for selected conditions, precautions do apply. °Overdosage: If you think you have taken too much of this medicine contact a poison control center or emergency room at once. °NOTE: This medicine is only for you. Do not share this medicine with others. °What if I miss a dose? °It is important not to miss your dose. Call your doctor or health care professional if you are unable to keep an appointment. °What may interact with this medicine? °Do not take this medicine with any of the following medications: °· deferoxamine °· dimercaprol °· other iron products °This medicine may also interact with the following medications: °· chloramphenicol °· deferasirox °This list may not describe all possible interactions. Give your health care provider a list of all the  medicines, herbs, non-prescription drugs, or dietary supplements you use. Also tell them if you smoke, drink alcohol, or use illegal drugs. Some items may interact with your medicine. °What should I watch for while using this medicine? °Visit your doctor or health care professional regularly. Tell your doctor if your symptoms do not start to get better or if they get worse. You may need blood work done while you are taking this medicine. °You may need to follow a special diet. Talk to your doctor. Foods that contain iron include: whole grains/cereals, dried fruits, beans, or peas, leafy green vegetables, and organ meats (liver, kidney). °Long-term use of this medicine may increase your risk of some cancers. Talk to your doctor about how to limit your risk. °What side effects may I notice from receiving this medicine? °Side effects that you should report to your doctor or health care professional as soon as possible: °· allergic reactions like skin rash, itching or hives, swelling of the face, lips, or tongue °· blue lips, nails, or skin °· breathing problems °· changes in blood pressure °· chest pain °· confusion °· fast, irregular heartbeat °· feeling faint or lightheaded, falls °· fever or chills °· flushing, sweating, or hot feelings °· joint or muscle aches or pains °· pain, tingling, numbness in the hands or feet °· seizures °· unusually weak or tired °Side effects that usually do not require medical attention (report to your doctor or health care professional if they continue or are bothersome): °· change in taste (metallic taste) °·   diarrhea °· headache °· irritation at site where injected °· nausea, vomiting °· stomach upset °This list may not describe all possible side effects. Call your doctor for medical advice about side effects. You may report side effects to FDA at 1-800-FDA-1088. °Where should I keep my medicine? °This drug is given in a hospital or clinic and will not be stored at home. °NOTE: This  sheet is a summary. It may not cover all possible information. If you have questions about this medicine, talk to your doctor, pharmacist, or health care provider. °© 2021 Elsevier/Gold Standard (2007-12-16 16:59:50) ° °

## 2020-10-26 NOTE — Progress Notes (Signed)
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 Test-dose given and patient tolerated well with no adverse reaction. Patient observed for 1 hour post test-dose. Full dose given. Vital signs remained stable throughout infusion. Discharge instructions given. Patient alert, oriented and transferred in wheelchair at discharge.

## 2020-10-28 ENCOUNTER — Ambulatory Visit: Payer: HMO | Admitting: Physician Assistant

## 2020-10-30 ENCOUNTER — Other Ambulatory Visit: Payer: Self-pay | Admitting: Neurology

## 2020-10-31 DIAGNOSIS — R1010 Upper abdominal pain, unspecified: Secondary | ICD-10-CM | POA: Diagnosis not present

## 2020-10-31 DIAGNOSIS — D509 Iron deficiency anemia, unspecified: Secondary | ICD-10-CM | POA: Diagnosis not present

## 2020-10-31 DIAGNOSIS — K625 Hemorrhage of anus and rectum: Secondary | ICD-10-CM | POA: Diagnosis not present

## 2020-10-31 DIAGNOSIS — R197 Diarrhea, unspecified: Secondary | ICD-10-CM | POA: Diagnosis not present

## 2020-11-03 ENCOUNTER — Observation Stay (HOSPITAL_COMMUNITY)
Admission: EM | Admit: 2020-11-03 | Discharge: 2020-11-04 | Disposition: A | Discharge status: Home / Self Care | Payer: HMO | Attending: Internal Medicine | Admitting: Internal Medicine

## 2020-11-03 ENCOUNTER — Emergency Department (HOSPITAL_COMMUNITY): Payer: HMO

## 2020-11-03 ENCOUNTER — Encounter (HOSPITAL_COMMUNITY): Payer: Self-pay | Admitting: Emergency Medicine

## 2020-11-03 DIAGNOSIS — Z20822 Contact with and (suspected) exposure to covid-19: Secondary | ICD-10-CM | POA: Diagnosis not present

## 2020-11-03 DIAGNOSIS — Z7984 Long term (current) use of oral hypoglycemic drugs: Secondary | ICD-10-CM | POA: Diagnosis not present

## 2020-11-03 DIAGNOSIS — J9611 Chronic respiratory failure with hypoxia: Secondary | ICD-10-CM | POA: Diagnosis not present

## 2020-11-03 DIAGNOSIS — R0602 Shortness of breath: Secondary | ICD-10-CM | POA: Diagnosis not present

## 2020-11-03 DIAGNOSIS — E1159 Type 2 diabetes mellitus with other circulatory complications: Secondary | ICD-10-CM | POA: Diagnosis not present

## 2020-11-03 DIAGNOSIS — D649 Anemia, unspecified: Principal | ICD-10-CM | POA: Diagnosis present

## 2020-11-03 DIAGNOSIS — Z79899 Other long term (current) drug therapy: Secondary | ICD-10-CM | POA: Insufficient documentation

## 2020-11-03 DIAGNOSIS — Z9861 Coronary angioplasty status: Secondary | ICD-10-CM | POA: Diagnosis not present

## 2020-11-03 DIAGNOSIS — J449 Chronic obstructive pulmonary disease, unspecified: Secondary | ICD-10-CM | POA: Diagnosis not present

## 2020-11-03 DIAGNOSIS — I130 Hypertensive heart and chronic kidney disease with heart failure and stage 1 through stage 4 chronic kidney disease, or unspecified chronic kidney disease: Secondary | ICD-10-CM | POA: Diagnosis not present

## 2020-11-03 DIAGNOSIS — Z7901 Long term (current) use of anticoagulants: Secondary | ICD-10-CM

## 2020-11-03 DIAGNOSIS — Z87891 Personal history of nicotine dependence: Secondary | ICD-10-CM | POA: Insufficient documentation

## 2020-11-03 DIAGNOSIS — I10 Essential (primary) hypertension: Secondary | ICD-10-CM

## 2020-11-03 DIAGNOSIS — E669 Obesity, unspecified: Secondary | ICD-10-CM | POA: Diagnosis not present

## 2020-11-03 DIAGNOSIS — I4821 Permanent atrial fibrillation: Secondary | ICD-10-CM

## 2020-11-03 DIAGNOSIS — I509 Heart failure, unspecified: Secondary | ICD-10-CM

## 2020-11-03 DIAGNOSIS — E1142 Type 2 diabetes mellitus with diabetic polyneuropathy: Secondary | ICD-10-CM | POA: Insufficient documentation

## 2020-11-03 DIAGNOSIS — J45909 Unspecified asthma, uncomplicated: Secondary | ICD-10-CM | POA: Insufficient documentation

## 2020-11-03 DIAGNOSIS — E66811 Obesity, class 1: Secondary | ICD-10-CM | POA: Diagnosis present

## 2020-11-03 DIAGNOSIS — N1832 Chronic kidney disease, stage 3b: Secondary | ICD-10-CM | POA: Insufficient documentation

## 2020-11-03 DIAGNOSIS — I5032 Chronic diastolic (congestive) heart failure: Secondary | ICD-10-CM

## 2020-11-03 DIAGNOSIS — R195 Other fecal abnormalities: Secondary | ICD-10-CM | POA: Diagnosis not present

## 2020-11-03 DIAGNOSIS — I5030 Unspecified diastolic (congestive) heart failure: Secondary | ICD-10-CM | POA: Diagnosis not present

## 2020-11-03 DIAGNOSIS — K922 Gastrointestinal hemorrhage, unspecified: Secondary | ICD-10-CM

## 2020-11-03 DIAGNOSIS — I2510 Atherosclerotic heart disease of native coronary artery without angina pectoris: Secondary | ICD-10-CM | POA: Diagnosis not present

## 2020-11-03 DIAGNOSIS — D500 Iron deficiency anemia secondary to blood loss (chronic): Secondary | ICD-10-CM | POA: Diagnosis not present

## 2020-11-03 LAB — COMPREHENSIVE METABOLIC PANEL
ALT: 12 U/L (ref 0–44)
AST: 19 U/L (ref 15–41)
Albumin: 3.7 g/dL (ref 3.5–5.0)
Alkaline Phosphatase: 62 U/L (ref 38–126)
Anion gap: 10 (ref 5–15)
BUN: 15 mg/dL (ref 8–23)
CO2: 25 mmol/L (ref 22–32)
Calcium: 9.1 mg/dL (ref 8.9–10.3)
Chloride: 95 mmol/L — ABNORMAL LOW (ref 98–111)
Creatinine, Ser: 1.36 mg/dL — ABNORMAL HIGH (ref 0.44–1.00)
GFR, Estimated: 41 mL/min — ABNORMAL LOW (ref >60)
Glucose, Bld: 137 mg/dL — ABNORMAL HIGH (ref 70–99)
Potassium: 4 mmol/L (ref 3.5–5.1)
Sodium: 130 mmol/L — ABNORMAL LOW (ref 135–145)
Total Bilirubin: 0.6 mg/dL (ref 0.3–1.2)
Total Protein: 7.3 g/dL (ref 6.5–8.1)

## 2020-11-03 LAB — URINALYSIS, ROUTINE W REFLEX MICROSCOPIC
Bilirubin Urine: NEGATIVE
Glucose, UA: >=500 mg/dL — AB
Ketones, ur: NEGATIVE mg/dL
Nitrite: POSITIVE — AB
Protein, ur: NEGATIVE mg/dL
Specific Gravity, Urine: 1.002 — ABNORMAL LOW (ref 1.005–1.030)
WBC, UA: >50 WBC/hpf — ABNORMAL HIGH (ref 0–5)
pH: 5 (ref 5.0–8.0)

## 2020-11-03 LAB — CBC
HCT: 24 % — ABNORMAL LOW (ref 36.0–46.0)
Hemoglobin: 7 g/dL — ABNORMAL LOW (ref 12.0–15.0)
MCH: 23.8 pg — ABNORMAL LOW (ref 26.0–34.0)
MCHC: 29.2 g/dL — ABNORMAL LOW (ref 30.0–36.0)
MCV: 81.6 fL (ref 80.0–100.0)
Platelets: 253 10*3/uL (ref 150–400)
RBC: 2.94 MIL/uL — ABNORMAL LOW (ref 3.87–5.11)
RDW: 25.5 % — ABNORMAL HIGH (ref 11.5–15.5)
WBC: 6.6 10*3/uL (ref 4.0–10.5)
nRBC: 0 % (ref 0.0–0.2)

## 2020-11-03 LAB — GLUCOSE, CAPILLARY
Glucose-Capillary: 133 mg/dL — ABNORMAL HIGH (ref 70–99)
Glucose-Capillary: 104 mg/dL — ABNORMAL HIGH (ref 70–99)
Glucose-Capillary: 141 mg/dL — ABNORMAL HIGH (ref 70–99)

## 2020-11-03 LAB — PROTIME-INR
INR: 1.4 — ABNORMAL HIGH (ref 0.8–1.2)
Prothrombin Time: 16.2 seconds — ABNORMAL HIGH (ref 11.4–15.2)

## 2020-11-03 LAB — HEMOGLOBIN AND HEMATOCRIT, BLOOD
HCT: 28 % — ABNORMAL LOW (ref 36.0–46.0)
Hemoglobin: 8.5 g/dL — ABNORMAL LOW (ref 12.0–15.0)

## 2020-11-03 LAB — RESP PANEL BY RT-PCR (FLU A&B, COVID) ARPGX2
Influenza A by PCR: NEGATIVE
Influenza B by PCR: NEGATIVE
SARS Coronavirus 2 by RT PCR: NEGATIVE

## 2020-11-03 LAB — PREPARE RBC (CROSSMATCH)

## 2020-11-03 LAB — TROPONIN I (HIGH SENSITIVITY)
Troponin I (High Sensitivity): 5 ng/L (ref <18)
Troponin I (High Sensitivity): 6 ng/L (ref <18)

## 2020-11-03 LAB — POC OCCULT BLOOD, ED: Fecal Occult Bld: POSITIVE — AB

## 2020-11-03 MED ORDER — QUINAPRIL HCL 10 MG PO TABS
40.0000 mg | ORAL_TABLET | Freq: Every day | ORAL | Status: DC
  Administered 2020-11-03: 40 mg via ORAL
  Filled 2020-11-03 (×2): qty 4

## 2020-11-03 MED ORDER — PANTOPRAZOLE SODIUM 40 MG PO TBEC: 40.0000 mg | DELAYED_RELEASE_TABLET | Freq: Every day | ORAL | Status: DC

## 2020-11-03 MED ORDER — ACETAMINOPHEN 650 MG RE SUPP: 650.0000 mg | Freq: Four times a day (QID) | RECTAL | Status: DC | PRN

## 2020-11-03 MED ORDER — ROPINIROLE HCL 0.5 MG PO TABS
0.5000 mg | ORAL_TABLET | Freq: Every day | ORAL | Status: DC
  Administered 2020-11-03: 0.5 mg via ORAL
  Filled 2020-11-03: qty 1

## 2020-11-03 MED ORDER — INSULIN ASPART 100 UNIT/ML ~~LOC~~ SOLN
0.0000 [IU] | Freq: Three times a day (TID) | SUBCUTANEOUS | Status: DC
  Administered 2020-11-04: 1 [IU] via SUBCUTANEOUS

## 2020-11-03 MED ORDER — COLESTIPOL HCL 1 G PO TABS
1.0000 g | ORAL_TABLET | ORAL | Status: DC | PRN
  Filled 2020-11-03: qty 1

## 2020-11-03 MED ORDER — SODIUM CHLORIDE 0.9 % IV SOLN: 10.0000 mL/h | Freq: Once | INTRAVENOUS | Status: DC

## 2020-11-03 MED ORDER — PEG 3350-KCL-NA BICARB-NACL 420 G PO SOLR
4000.0000 mL | Freq: Once | ORAL | Status: AC
  Administered 2020-11-03: 4000 mL via ORAL
  Filled 2020-11-03: qty 4000

## 2020-11-03 MED ORDER — METOPROLOL TARTRATE 25 MG PO TABS: 25.0000 mg | ORAL_TABLET | Freq: Every day | ORAL | Status: DC | PRN

## 2020-11-03 MED ORDER — TORSEMIDE 20 MG PO TABS
40.0000 mg | ORAL_TABLET | Freq: Two times a day (BID) | ORAL | Status: DC
  Administered 2020-11-03 – 2020-11-04 (×2): 40 mg via ORAL
  Filled 2020-11-03 (×5): qty 2

## 2020-11-03 MED ORDER — POTASSIUM CHLORIDE CRYS ER 10 MEQ PO TBCR
10.0000 meq | EXTENDED_RELEASE_TABLET | Freq: Two times a day (BID) | ORAL | Status: DC
  Administered 2020-11-03: 10 meq via ORAL
  Filled 2020-11-03 (×2): qty 1

## 2020-11-03 MED ORDER — SENNA 8.6 MG PO TABS: 1.0000 | ORAL_TABLET | Freq: Every evening | ORAL | Status: DC | PRN

## 2020-11-03 MED ORDER — HYDROCODONE-ACETAMINOPHEN 5-325 MG PO TABS
0.5000 | ORAL_TABLET | Freq: Every day | ORAL | Status: DC
  Administered 2020-11-03: 0.5 via ORAL
  Filled 2020-11-03: qty 1

## 2020-11-03 MED ORDER — LORAZEPAM 0.5 MG PO TABS: 0.5000 mg | ORAL_TABLET | Freq: Two times a day (BID) | ORAL | Status: DC | PRN

## 2020-11-03 MED ORDER — ROSUVASTATIN CALCIUM 5 MG PO TABS
10.0000 mg | ORAL_TABLET | Freq: Every evening | ORAL | Status: DC
  Administered 2020-11-03: 10 mg via ORAL
  Filled 2020-11-03 (×2): qty 2

## 2020-11-03 MED ORDER — DILTIAZEM HCL ER COATED BEADS 180 MG PO CP24
180.0000 mg | ORAL_CAPSULE | Freq: Every day | ORAL | Status: DC
  Administered 2020-11-04: 180 mg via ORAL
  Filled 2020-11-03: qty 1

## 2020-11-03 MED ORDER — FAMOTIDINE 20 MG PO TABS
20.0000 mg | ORAL_TABLET | Freq: Two times a day (BID) | ORAL | Status: DC
  Administered 2020-11-03 (×2): 20 mg via ORAL
  Filled 2020-11-03 (×2): qty 1

## 2020-11-03 MED ORDER — LATANOPROST 0.005 % OP SOLN
1.0000 [drp] | Freq: Every day | OPHTHALMIC | Status: DC
  Administered 2020-11-03: 1 [drp] via OPHTHALMIC
  Filled 2020-11-03: qty 2.5

## 2020-11-03 MED ORDER — ONDANSETRON HCL 4 MG/2ML IJ SOLN: 4.0000 mg | Freq: Four times a day (QID) | INTRAMUSCULAR | Status: DC | PRN

## 2020-11-03 MED ORDER — PAROXETINE HCL 20 MG PO TABS
25.0000 mg | ORAL_TABLET | Freq: Every day | ORAL | Status: DC
  Filled 2020-11-03: qty 0.5

## 2020-11-03 MED ORDER — TRAZODONE HCL 100 MG PO TABS
100.0000 mg | ORAL_TABLET | Freq: Every day | ORAL | Status: DC
  Administered 2020-11-03: 100 mg via ORAL
  Filled 2020-11-03: qty 1

## 2020-11-03 MED ORDER — GABAPENTIN 100 MG PO CAPS
100.0000 mg | ORAL_CAPSULE | Freq: Two times a day (BID) | ORAL | Status: DC
  Administered 2020-11-03: 100 mg via ORAL
  Filled 2020-11-03 (×2): qty 1

## 2020-11-03 MED ORDER — METOPROLOL SUCCINATE ER 100 MG PO TB24
100.0000 mg | ORAL_TABLET | Freq: Two times a day (BID) | ORAL | Status: DC
  Administered 2020-11-03 – 2020-11-04 (×2): 100 mg via ORAL
  Filled 2020-11-03 (×2): qty 1

## 2020-11-03 MED ORDER — GERHARDT'S BUTT CREAM
1.0000 "application " | TOPICAL_CREAM | Freq: Three times a day (TID) | CUTANEOUS | Status: DC
  Administered 2020-11-03: 1 via TOPICAL
  Filled 2020-11-03: qty 1

## 2020-11-03 MED ORDER — NYSTATIN 100000 UNIT/GM EX OINT
1.0000 "application " | TOPICAL_OINTMENT | Freq: Two times a day (BID) | CUTANEOUS | Status: DC | PRN
  Filled 2020-11-03: qty 15

## 2020-11-03 MED ORDER — SODIUM CHLORIDE 0.9% FLUSH
3.0000 mL | Freq: Two times a day (BID) | INTRAVENOUS | Status: DC
  Administered 2020-11-03: 3 mL via INTRAVENOUS

## 2020-11-03 MED ORDER — UMECLIDINIUM-VILANTEROL 62.5-25 MCG/INH IN AEPB
1.0000 | INHALATION_SPRAY | Freq: Every day | RESPIRATORY_TRACT | Status: DC
  Filled 2020-11-03: qty 14

## 2020-11-03 MED ORDER — METOPROLOL TARTRATE 5 MG/5ML IV SOLN
2.5000 mg | Freq: Once | INTRAVENOUS | Status: AC
  Administered 2020-11-03: 2.5 mg via INTRAVENOUS
  Filled 2020-11-03: qty 5

## 2020-11-03 MED ORDER — ONDANSETRON HCL 4 MG PO TABS: 4.0000 mg | ORAL_TABLET | Freq: Four times a day (QID) | ORAL | Status: DC | PRN

## 2020-11-03 MED ORDER — ALBUTEROL SULFATE (2.5 MG/3ML) 0.083% IN NEBU: 2.5000 mg | INHALATION_SOLUTION | Freq: Four times a day (QID) | RESPIRATORY_TRACT | Status: DC | PRN

## 2020-11-03 MED ORDER — GUAIFENESIN ER 600 MG PO TB12: 600.0000 mg | ORAL_TABLET | Freq: Two times a day (BID) | ORAL | Status: DC | PRN

## 2020-11-03 MED ORDER — ACETAMINOPHEN 325 MG PO TABS: 650.0000 mg | ORAL_TABLET | Freq: Four times a day (QID) | ORAL | Status: DC | PRN

## 2020-11-03 MED ORDER — PANTOPRAZOLE SODIUM 40 MG IV SOLR
40.0000 mg | Freq: Once | INTRAVENOUS | Status: AC
  Administered 2020-11-03: 40 mg via INTRAVENOUS
  Filled 2020-11-03: qty 40

## 2020-11-03 NOTE — ED Notes (Signed)
Attempted to give reportx1 

## 2020-11-03 NOTE — H&P (Signed)
History and Physical    [name] QMG:[id number] DOB: 12-Dec-1950 DOA: 11/03/2020  Referring MD/NP/PA: Deno Etienne, MD PCP: Hoyt Koch, MD  Patient coming from: Home  Chief Complaint: Low blood count  I have personally briefly reviewed patient's old medical records in [place]   HPI: [name] is a 70 y.o. female with medical history significant of atrial fibrillation with RVR on Xarelto, CHF last EF 50-55%, CAD, COPD, chronic anemia, and diabetes mellitus type 2 presented with complaints of low blood counts.  She reports over the last 2 months she has been having issues with intermittent diarrhea, constipation, and generalized abdominal pain.  Patient had made an appointment a couple weeks ago due to her symptoms and was seen by the PA at that time.  She was started on Colestid which she states helped some.  During this time she had progressively been getting more short of breath with exertion and fatigue.  She was seen by her PCP who would check lab work on 211 and noted her hemoglobin to be 8.7.  Her primary care provider had set her up to have an iron infusion last week.  Despite these things she still did not feel good.  Associated symptoms include included complaints of intermittent bright red blood per rectum, nonproductive cough, chest discomfort, and leg weakness.  She is on blood thinners of Xarelto, but also appears to be on Celebrex  ED Course: Upon admission into the emergency department patient was seen to be afebrile, pulse 92 119, and all other vital signs relatively within normal limits.  Labs significant for hemoglobin 7, sodium 130, BUN 15, creatinine 1.37, and high-sensitivity troponin negative x2.  Chest x-ray showed no signs of disease.  Stool guaiacs were noted to be positive.  Patient was typed and crossed to be transfused 2 units of packed red blood cells.   Review of Systems  Constitutional: Positive for chills and malaise/fatigue. Negative  for fever.  HENT: Negative for nosebleeds.   Eyes: Negative for double vision and photophobia.  Respiratory: Positive for cough and shortness of breath.   Cardiovascular: Positive for leg swelling. Negative for chest pain.  Gastrointestinal: Positive for abdominal pain, blood in stool, constipation and diarrhea.  Genitourinary: Negative for dysuria and hematuria.  Musculoskeletal: Negative for falls.  Neurological: Negative for focal weakness and loss of consciousness.  Psychiatric/Behavioral: Negative for substance abuse. The patient has insomnia.   All other systems reviewed and are negative.   Past Medical History:  Diagnosis Date  . Anemia, unspecified   . Arthritis   . Asthma   . Atrial fibrillation with RVR ([place])    a. on Xarelto  . Bell's palsy    Facial nerve decompression in 2001  . CHF (congestive heart failure) ([employer])   . Chronic low back pain   . COPD with asthma ([place])   . Coronary artery disease    Myoview 04/12/11 was entirely normal. ECHO 02/26/08 showed only minor abnormalities. Stenting 05/26/08 of her posterolateral branch to the left circumflex coronary artery. Used a 2.5x74m Taxus Monorail stent.myoview 2014 was without ischemia  . Diabetes mellitus    Type 2  . Early cataracts, bilateral   . Fatty liver   . GERD (gastroesophageal reflux disease)   . Glaucoma   . Glaucoma   . Goiter   . Heart murmur   . History of nuclear stress test 2012; 2014   lexiscan; normal pattern of perfusion; normal, low risk scan   .  Hyperlipidemia   . Hypertension   . Panic disorder   . Pneumonia 2008  . Polycystic ovary disease    Hysterectomy in 1982 for this  . Shortness of breath dyspnea    ECHO 02/26/08 showed only minor abnormalities  . Spinal stenosis     Past Surgical History:  Procedure Laterality Date  . ABDOMINAL HYSTERECTOMY  1982   & BSO; for polycystic ovary disease  . CARDIOVERSION N/A 12/17/2013   Procedure: CARDIOVERSION;  Surgeon: Pixie Casino, MD;   Location: [facility] ENDOSCOPY;  Service: Cardiovascular;  Laterality: N/A;  . CARDIOVERSION N/A 09/30/2015   Procedure: CARDIOVERSION;  Surgeon: Dorothy Spark, MD;  Location: [facility] ENDOSCOPY;  Service: Cardiovascular;  Laterality: N/A;  . CARDIOVERSION N/A 06/22/2016   Procedure: CARDIOVERSION;  Surgeon: Skeet Latch, MD;  Location: [facility] ENDOSCOPY;  Service: Cardiovascular;  Laterality: N/A;  . CENTRAL LINE INSERTION  01/13/2019   Procedure: CENTRAL LINE INSERTION;  Surgeon: Jolaine Artist, MD;  Location: [place] CV LAB;  Service: Cardiovascular;;  . COLONOSCOPY     last 2009; Dr Cristina Gong; due 2019  . COLONOSCOPY WITH PROPOFOL N/A 01/23/2018   Procedure: COLONOSCOPY WITH PROPOFOL;  Surgeon: Ronald Lobo, MD;  Location: [facility];  Service: Endoscopy;  Laterality: N/A;  . CORONARY ANGIOPLASTY  05/26/2008   Stenting of her posterolateral branch to the left circumflex coronary artery. Used a 2.5x79m Taxus Monorail stent.  . ESOPHAGOGASTRODUODENOSCOPY (EGD) WITH PROPOFOL N/A 01/23/2018   Procedure: ESOPHAGOGASTRODUODENOSCOPY (EGD) WITH PROPOFOL;  Surgeon: BRonald Lobo MD;  [place] [facility]  Service: Endoscopy;  Laterality: N/A;  . FACIAL NERVE DECOMPRESSION  2001/2002   bells palsy   . LAPAROSCOPIC CHOLECYSTECTOMY  06/15/2011    Dr IDalbert Batman . PERICARDIOCENTESIS N/A 01/13/2019   Procedure: PERICARDIOCENTESIS;  Surgeon: MBurnell Blanks MD;  [facility];  Service: Cardiovascular;  Laterality: N/A;  . RIGHT AND LEFT HEART CATH N/A 01/13/2019   Procedure: RIGHT AND LEFT HEART CATH;  Surgeon: BJolaine Artist MD;  [facility];  Service: Cardiovascular;  Laterality: N/A;  . TEE WITHOUT CARDIOVERSION N/A 12/17/2013   Procedure: TRANSESOPHAGEAL ECHOCARDIOGRAM (TEE);  Surgeon: KPixie Casino MD;  [facility];  Service: Cardiovascular;  Laterality: N/A;  trish/ja  . TRANSTHORACIC ECHOCARDIOGRAM  75/40/0867  LV systolic function normal  with mild conc LVH; LA mildly dilated; trace MR/TR  . UPPER GI ENDOSCOPY  2009   negative     reports that she quit smoking about 27 years ago. Her smoking use included cigarettes. She has a 45.00 pack-year smoking history. She has never used smokeless tobacco. She reports that she does not drink alcohol and does not use drugs.  Allergies  Allergen Reactions  . Benadryl [Diphenhydramine Hcl] Other (See Comments)    Restless leg  . Clopidogrel Bisulfate Nausea Only    Nausea & pain  . Contrast Media [Iodinated Diagnostic Agents] Palpitations    Rapid heart rate, hot    Family History  Problem Relation Age of Onset  . Heart attack Father 435       2ndMI at 653 . Colon cancer Brother 785 . Gout Brother   . Ulcers Mother   . Emphysema Mother 79 . Colon polyps Sister   . Cancer Sister        Basal cell carcinoma  . Pneumonia Maternal Grandmother   . Hypertension Brother   . Hyperlipidemia Brother   . Cancer Brother  Skin  . Diabetes Neg Hx   . Stroke Neg Hx     Prior to Admission medications   Medication Sig Start Date End Date Taking? Authorizing Provider  amoxicillin (AMOXIL) 875 MG tablet Take 875 mg by mouth 2 (two) times daily. Patient not taking: Reported on 09/30/2020 06/22/20   [provider]  ascorbic acid (VITAMIN C) 1000 MG tablet Take 1,000 mg by mouth daily.    [provider]  benzonatate (TESSALON) 200 MG capsule Take by mouth. 08/24/20   [provider]  CALCIUM-MAG-VIT C-VIT D PO Take 1 tablet by mouth daily.    [provider]  canagliflozin (INVOKANA) 100 MG TABS tablet Take 1 tablet (100 mg total) by mouth daily before breakfast. 10/07/20   Hoyt Koch, MD  celecoxib (CELEBREX) 200 MG capsule TAKE (1) CAPSULE TWICE DAILY. 10/11/20   Hoyt Koch, MD  colestipol (COLESTID) 1 g tablet Take 1 g by mouth 2 (two) times daily. Patient not taking: Reported on 09/30/2020 02/10/20   [provider]   Continuous Blood Gluc Sensor (FREESTYLE LIBRE 14 DAY SENSOR) MISC FreeStyle Libre 14 Day Sensor kit    [provider]  diltiazem (CARDIZEM CD) 180 MG 24 hr capsule TAKE 1 CAPSULE DAILY. 12/22/19   Lorretta Harp, MD  famotidine (PEPCID) 20 MG tablet Take 20 mg by mouth 2 (two) times daily. 02/10/20   [provider]  gabapentin (NEURONTIN) 100 MG capsule TAKE (1) CAPSULE THREE TIMES DAILY. 08/29/20   Hoyt Koch, MD  guaiFENesin (MUCINEX) 600 MG 12 hr tablet Take 600 mg by mouth 2 (two) times daily as needed for cough.     [provider]  HYDROcodone-acetaminophen (NORCO/VICODIN) 5-325 MG tablet Take 1 tablet by mouth at bedtime. 08/16/20   Marrian Salvage, FNP  isosorbide mononitrate (IMDUR) 30 MG 24 hr tablet 1/2 tablet    [provider]  latanoprost (XALATAN) 0.005 % ophthalmic solution Place 1 drop into both eyes at bedtime. 12/09/13   [provider]  levalbuterol Penne Lash) 0.63 MG/3ML nebulizer solution 3 ml    [provider]  LORazepam (ATIVAN) 0.5 MG tablet TAKE 1 OR 2 TABLETS AT BEDTIME. 06/28/20   Hoyt Koch, MD  metoprolol succinate (TOPROL-XL) 100 MG 24 hr tablet TAKE ONE TABLET TWICE A DAY WITH OR IMMEDIATELY FOLLOWING A MEAL. 05/11/20   Croitoru, Mihai, MD  metoprolol tartrate (LOPRESSOR) 25 MG tablet Take one tablet daily as needed for rapid palpitations. 04/12/20   Deberah Pelton, NP  Multiple Minerals-Vitamins (CALCIUM CITRATE PLUS/MAGNESIUM PO) 1 tablet    [provider]  Multiple Vitamins-Iron (MULTIVITAMINS WITH IRON) TABS Take 1 tablet by mouth daily.    [provider]  nystatin ointment (MYCOSTATIN) Apply 1 application topically 2 (two) times daily as needed (IRRITATION).  07/09/16   [provider]  NYSTATIN PO     [provider]  omega-3 acid ethyl esters (LOVAZA) 1 g capsule 1 tablet    [provider]  omeprazole (PRILOSEC) 20 MG capsule Take  1 capsule (20 mg total) by mouth 2 (two) times daily before a meal. 12/31/18   Eliseo Squires, Tomi Bamberger, DO  omeprazole (PRILOSEC) 20 MG capsule 2 capsule    [provider]  ONETOUCH DELICA LANCETS FINE MISC 1 Units by Does not apply route as directed. 10/05/11   Midge Minium, MD  PARoxetine (PAXIL) 10 MG tablet TAKE 2 AND 1/2 TABS DAILY 07/28/20   Pricilla Holm  A, MD  POTASSIUM BICARBONATE PO 2 tablets    [provider]  potassium chloride (MICRO-K) 10 MEQ CR capsule TAKE (2) CAPSULES DAILY. 10/23/19   Croitoru, Mihai, MD  quinapril (ACCUPRIL) 40 MG tablet TAKE 1 TABLET ONCE DAILY. 09/15/20   Croitoru, Mihai, MD  rOPINIRole (REQUIP XL) 2 MG 24 hr tablet Take 1 tablet (2 mg total) by mouth at bedtime. 11/02/19   Dohmeier, Asencion Partridge, MD  rOPINIRole (REQUIP) 0.5 MG tablet TAKE 1 TO 2 TABLETS AT MIDNIGHT. 10/31/20   Dohmeier, Asencion Partridge, MD  rosuvastatin (CRESTOR) 10 MG tablet Take 1 tablet (10 mg total) by mouth daily. 12/07/19 04/12/20  Croitoru, Mihai, MD  senna (SENOKOT) 8.6 MG TABS Take 1 tablet by mouth at bedtime.    [provider]  sitaGLIPtin-metformin (JANUMET) 50-1000 MG tablet Take 1 tablet by mouth 2 (two) times daily with a meal. TAKE 1/2 TABLET IN THE MORNING WITH BREAKFAST AND 1 TABLET IN THE EVENING WITH SUPPER. 07/11/20   Hoyt Koch, MD  torsemide (DEMADEX) 20 MG tablet TAKE (2) TABLETS TWICE DAILY. 08/15/20   Hoyt Koch, MD  traZODone (DESYREL) 100 MG tablet TAKE 1/2 TO 1 TABLET AT BEDTIME. 08/10/20   Hoyt Koch, MD  umeclidinium-vilanterol [facility] ELLIPTA) 62.5-25 MCG/INH AEPB Inhale 1 puff into the lungs daily. 08/22/20   Young, Kasandra Knudsen, MD  XARELTO 20 MG TABS tablet TAKE 1 TABLET ONCE DAILY WITH SUPPER. 06/27/20   Croitoru, Dani Gobble, MD    Physical Exam:  Constitutional: Elderly obese female currently in no acute distress Vitals:   11/03/20 0907 11/03/20 0930 11/03/20 1000 11/03/20 1030  BP: (!) 151/53 124/68 (!) 128/48 135/60   Pulse: (!) 107 90 (!) 119 (!) 104  Resp: '18 14 18 15  ' Temp: 98.8 F (37.1 C)     SpO2: 100% 96% 98% 96%   Eyes: PERRL, lids and conjunctivae normal ENMT: Mucous membranes are moist. Posterior pharynx clear of any exudate or lesions. .  Neck: normal, supple, no masses, no thyromegaly Respiratory: clear to auscultation bilaterally, no wheezing, no crackles. Normal respiratory effort. No accessory muscle use.  Cardiovascular: Irregular irregular, no murmurs / rubs / gallops.  Trace lower extremity edema. 2+ pedal pulses. No carotid bruits.  Abdomen: Mild epigastric tenderness to palpation, no masses palpated. No hepatosplenomegaly. Bowel sounds positive.  Musculoskeletal: no clubbing / cyanosis. No joint deformity upper and lower extremities. Good ROM, no contractures. Normal muscle tone.  Skin: Varicose veins of bilateral lower extremities Neurologic: CN 2-12 grossly intact. Sensation intact, DTR normal. Strength 5/5 in all 4.  Psychiatric: Normal judgment and insight. Alert and oriented x 3. Normal mood.     Labs on Admission: I have personally reviewed following labs and imaging studies  CBC: Recent Labs  Lab 11/03/20 0914  WBC 6.6  HGB 7.0*  HCT 24.0*  MCV 81.6  PLT 774   Basic Metabolic Panel: Recent Labs  Lab 11/03/20 0914  NA 130*  K 4.0  CL 95*  CO2 25  GLUCOSE 137*  BUN 15  CREATININE 1.36*  CALCIUM 9.1   GFR: CrCl cannot be calculated (Unknown ideal weight.). Liver Function Tests: Recent Labs  Lab 11/03/20 0914  AST 19  ALT 12  ALKPHOS 62  BILITOT 0.6  PROT 7.3  ALBUMIN 3.7   No results for input(s): LIPASE, AMYLASE in the last 168 hours. No results for input(s): AMMONIA in the last 168 hours. Coagulation Profile: Recent Labs  Lab 11/03/20 0957  INR 1.4*  Cardiac Enzymes: No results for input(s): CKTOTAL, CKMB, CKMBINDEX, TROPONINI in the last 168 hours. BNP (last 3 results) Recent Labs    01/08/20 1616 09/23/20 1601  PROBNP 220.0*  224.0*   HbA1C: No results for input(s): HGBA1C in the last 72 hours. CBG: No results for input(s): GLUCAP in the last 168 hours. Lipid Profile: No results for input(s): CHOL, HDL, LDLCALC, TRIG, CHOLHDL, LDLDIRECT in the last 72 hours. Thyroid Function Tests: No results for input(s): TSH, T4TOTAL, FREET4, T3FREE, THYROIDAB in the last 72 hours. Anemia Panel: No results for input(s): VITAMINB12, FOLATE, FERRITIN, TIBC, IRON, RETICCTPCT in the last 72 hours. Urine analysis:    Component Value Date/Time   COLORURINE AMBER (A) 01/13/2019 1301   APPEARANCEUR CLOUDY (A) 01/13/2019 1301   LABSPEC 1.012 01/13/2019 1301   PHURINE 6.0 01/13/2019 1301   GLUCOSEU NEGATIVE 01/13/2019 1301   HGBUR SMALL (A) 01/13/2019 1301   HGBUR negative 05/18/2010 1218   BILIRUBINUR NEGATIVE 01/13/2019 1301   BILIRUBINUR Neg 10/02/2018 1202   KETONESUR NEGATIVE 01/13/2019 1301   PROTEINUR 100 (A) 01/13/2019 1301   UROBILINOGEN 0.2 10/02/2018 1202   UROBILINOGEN 0.2 06/11/2011 1215   NITRITE NEGATIVE 01/13/2019 1301   LEUKOCYTESUR LARGE (A) 01/13/2019 1301   Sepsis Labs: No results found for this or any previous visit (from the past 240 hour(s)).   Radiological Exams on Admission: DG Chest Port 1 View  Result Date: 11/03/2020 CLINICAL DATA:  Low hemoglobin and shortness of breath EXAM: PORTABLE CHEST 1 VIEW COMPARISON:  09/30/2020 FINDINGS: Mild scarring at the lingula, also seen on 2020 chest CT. There is no edema, consolidation, effusion, or pneumothorax. Stable borderline heart size for technique. Atherosclerotic calcification of the aorta. Artifact from EKG leads. IMPRESSION: No evidence of active disease. Electronically Signed   By: Monte Fantasia M.D.   On: 11/03/2020 10:29    Chest x-ray: Independently reviewed.  No acute disease.  Assessment/Plan Symptomatic anemia secondary to GI bleed: Acute.  Patient presents with complaints of weakness and fatigue with reports of remittent bright red  blood per rectum.  Hemoglobin noted to be 9 on admission.  Stool guaiacs were noted to be positive.  Dr. Cristina Gong of  GI was formally consulted and plan on taking for EGD and colonoscopy -Admit to a medical telemetry bed -Hold Xarelto and Celebrex -Changed orders to transfuse only 1 unit of packed red blood cells  -Recheck H&H posttransfusion and transfuse additional unit of packed red blood cells if hemoglobin less than 8 g/dL -Clear liquid diet and n.p.o. after midnight  -Appreciate Dr. Cristina Gong of gastroenterology consultative services, we will follow for any further recommendation  Permanent atrial fibrillation on chronic anticoagulation: Patient has atrial fibrillation and appears to be relatively rate controlled at this time. -Hold Xarelto due to GI bleed -Continue diltiazem and beta-blocker  Essential hypertension: Currently stable. -Continue home medications  Diabetes mellitus type 2, uncontrolled: Last hemoglobin A1c was 7.3 on 09/23/2020.  Home medications include Invokana 100 mg daily and Janumet 50-1000 mg (half a tablet in the morning and 1 tablet with supper). -Hypoglycemic protocol -Hold home oral medication -CBGs before every meal with sensitive SSI  Diastolic heart failure: Chronic.  Patient appears to be relatively euvolemic at this time with no significant signs of fluid overload.  Chest x-ray was otherwise noted to be clear.  Last EF noted to be 50 -55% last echocardiogram in 11/2019. -Strict intake and output -Daily weight  CAD s/p PCI  COPD mixed type: On physical exam lungs  sound clear at this time. -Continue home maintenance medication  Chronic kidney disease stage IIIb: Creatinine 1.36 which appears just mildly elevated from previous baseline of 1.26, but is not greater than 0.3 to qualify for an acute kidney injury. -Continue to monitor kidney function   Anxiety -Continue paroxetine and Ativan as needed  History of DVT  GERD -Continue Pepcid twice  daily  Obesity: BMI 35.9 kg/m  DVT prophylaxis: SCDs Code Status:  Full Family Communication: Family present in room updated Disposition Plan: Likely discharge home once medically stable Consults called: GI Admission status: Observation  Norval Morton MD [facility]   If 7PM-7AM, please contact night-coverage   11/03/2020, 10:49 AM

## 2020-11-03 NOTE — Progress Notes (Signed)
Paged Dr. Tamala Julian about pt heart rate 132 and no meds in [facility] for BP.  Pt stated she takes 100 mg PO in the morning and 100 mg PO in the evening, and she has a PRN dose of 25 mg PO as needed.  Dr. Tamala Julian called back and is entering more orders.

## 2020-11-03 NOTE — ED Triage Notes (Signed)
Pt  Here sent by MD for a blood transfusion , pt hgb of 6 per pt , pt is on blood thinners and  Has chronic anemia

## 2020-11-03 NOTE — Progress Notes (Signed)
Pt arrived on unit 1230

## 2020-11-03 NOTE — ED Provider Notes (Signed)
[facility] EMERGENCY DEPARTMENT Provider Note   CSN: [id number] Arrival date & time: 11/03/20  7078     History No chief complaint on file.   [name] is a 70 y.o. female.  HPI   Patient with significant medical history of COPD, former smoker, diabetes, A. fib currently on Xarelto presents with chief complaint of anemia.  She endorses over the last few weeks she has become more fatigued, states she has no energy to move around,  will have shortness of breath and chest pain, which is not associated with becoming diaphoretic, radiating pain, nausea or vomiting, no lightheadedness or dizziness.  Patient has history of CHF and atrial fib, states she has not missed any doses of her Xarelto, denies recent leg swelling or traveling.  She endorses that she has had issues with symptomatic anemia for last 3 years, she has received 1 transfusion in the past, is currently receiving iron transfusions.  She has had  colonoscopy and an endoscopy 2 years ago with Dr. Cristina Gong with [facility] which were unremarkable, she denies history of stomach ulcers, NSAID use, alcohol use, no history of diverticulitis, Crohn's disease or UC.  She does endorse that over the last couple days she has noticed bright red blood on her stool, denies dark or tarry stools, denies blood in her urine, recent nosebleeds, abnormal bleeding.  Patient denies any alleviating factors.  Patient denies headaches, fevers, chills, abdominal pain, nausea, vomiting, diarrhea, worsening pedal edema.  Past Medical History:  Diagnosis Date  . Anemia, unspecified   . Arthritis   . Asthma   . Atrial fibrillation with RVR ([place])    a. on Xarelto  . Bell's palsy    Facial nerve decompression in 2001  . CHF (congestive heart failure) ([place])   . Chronic low back pain   . COPD with asthma ([place])   . Coronary artery disease    Myoview 04/12/11 was entirely normal. ECHO 02/26/08 showed only minor abnormalities. Stenting 05/26/08 of  her posterolateral branch to the left circumflex coronary artery. Used a 2.5x70m Taxus Monorail stent.myoview 2014 was without ischemia  . Diabetes mellitus    Type 2  . Early cataracts, bilateral   . Fatty liver   . GERD (gastroesophageal reflux disease)   . Glaucoma   . Glaucoma   . Goiter   . Heart murmur   . History of nuclear stress test 2012; 2014   lexiscan; normal pattern of perfusion; normal, low risk scan   . Hyperlipidemia   . Hypertension   . Panic disorder   . Pneumonia 2008  . Polycystic ovary disease    Hysterectomy in 1982 for this  . Shortness of breath dyspnea    ECHO 02/26/08 showed only minor abnormalities  . Spinal stenosis     Patient Active Problem List   Diagnosis Date Noted  . Symptomatic anemia 11/03/2020  . Weakness of both lower extremities 09/23/2020  . Abnormal feces 09/19/2020  . Family history of malignant neoplasm of gastrointestinal tract 09/19/2020  . Upper abdominal pain 09/19/2020  . Chronic respiratory failure with hypoxia ([place] 09/01/2020  . Nocturnal hypoxemia 04/05/2020  . New daily persistent headache 01/08/2020  . Deep vein thrombosis (DVT) of calf muscle vein of left lower extremity ([place] 11/02/2019  . Obesity hypoventilation syndrome ([place] 11/02/2019  . Coagulopathy ([place] 11/02/2019  . Coronary artery disease involving native coronary artery of native heart with angina pectoris ([place] 11/02/2019  . Bradycardia 01/12/2019  . Pericardial effusion  01/12/2019  . Diarrhea 01/12/2019  . Shortness of breath   . CHF (congestive heart failure) ([place]) 01/11/2019  . Hyponatremia 12/30/2018  . Permanent atrial fibrillation ([place]) 06/10/2018  . Arthritis 10/25/2017  . COPD mixed type ([place]) 10/25/2017  . Fibrocystic disease of breast 10/25/2017  . Gastroesophageal reflux disease 10/25/2017  . Glaucoma 10/25/2017  . 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 Heart murmur 10/25/2017  . Insomnia 10/25/2017  . Osteoporosis 10/25/2017  . RLS (restless legs  syndrome) 04/23/2017  . Routine general medical examination at a health care facility 11/16/2016  . Diabetic polyneuropathy ([place]) 07/30/2016  . Depression 01/04/2016  . Obesity (BMI 30.0-34.9) 10/11/2015  . CAD S/P percutaneous coronary angioplasty 04/28/2013  . Essential hypertension 05/06/2009  . LOW BACK PAIN, CHRONIC 02/12/2008  . Anemia 08/15/2007  . Type 2 diabetes mellitus with vascular disease ([place]) 01/02/2007  . Hyperlipidemia associated with type 2 diabetes mellitus ([place]) 01/02/2007  . ELEVATION, TRANSAMINASE/LDH LEVELS 01/02/2007    Past Surgical History:  Procedure Laterality Date  . ABDOMINAL HYSTERECTOMY  1982   & BSO; for polycystic ovary disease  . CARDIOVERSION N/A 12/17/2013   Procedure: CARDIOVERSION;  Surgeon: Pixie Casino, MD;  Location: [facility] ENDOSCOPY;  Service: Cardiovascular;  Laterality: N/A;  . CARDIOVERSION N/A 09/30/2015   Procedure: CARDIOVERSION;  Surgeon: Dorothy Spark, MD;  Location: [facility] ENDOSCOPY;  Service: Cardiovascular;  Laterality: N/A;  . CARDIOVERSION N/A 06/22/2016   Procedure: CARDIOVERSION;  Surgeon: Skeet Latch, MD;  Location: [facility] ENDOSCOPY;  Service: Cardiovascular;  Laterality: N/A;  . CENTRAL LINE INSERTION  01/13/2019   Procedure: CENTRAL LINE INSERTION;  Surgeon: Jolaine Artist, MD;  Location: [place] CV LAB;  Service: Cardiovascular;;  . COLONOSCOPY     last 2009; Dr Cristina Gong; due 2019  . COLONOSCOPY WITH PROPOFOL N/A 01/23/2018   Procedure: COLONOSCOPY WITH PROPOFOL;  Surgeon: Ronald Lobo, MD;  Location: [place];  Service: Endoscopy;  Laterality: N/A;  . CORONARY ANGIOPLASTY  05/26/2008   Stenting of her posterolateral branch to the left circumflex coronary artery. Used a 2.5x21m Taxus Monorail stent.  . ESOPHAGOGASTRODUODENOSCOPY (EGD) WITH PROPOFOL N/A 01/23/2018   Procedure: ESOPHAGOGASTRODUODENOSCOPY (EGD) WITH PROPOFOL;  Surgeon: BRonald Lobo MD;  [place]  Service: Endoscopy;  Laterality:  N/A;  . FACIAL NERVE DECOMPRESSION  2001/2002   bells palsy   . LAPAROSCOPIC CHOLECYSTECTOMY  06/15/2011    Dr IDalbert Batman . PERICARDIOCENTESIS N/A 01/13/2019   Procedure: PERICARDIOCENTESIS;  Surgeon: MBurnell Blanks MD;  [place];  Service: Cardiovascular;  Laterality: N/A;  . RIGHT AND LEFT HEART CATH N/A 01/13/2019   Procedure: RIGHT AND LEFT HEART CATH;  Surgeon: BJolaine Artist MD;  [place];  Service: Cardiovascular;  Laterality: N/A;  . TEE WITHOUT CARDIOVERSION N/A 12/17/2013   Procedure: TRANSESOPHAGEAL ECHOCARDIOGRAM (TEE);  Surgeon: KPixie Casino MD;  [facility];  Service: Cardiovascular;  Laterality: N/A;  trish/ja  . TRANSTHORACIC ECHOCARDIOGRAM  74/04/8118  LV systolic function normal with mild conc LVH; LA mildly dilated; trace MR/TR  . UPPER GI ENDOSCOPY  2009   negative     OB History   No obstetric history on file.     Family History  Problem Relation Age of Onset  . Heart attack Father 462       2ndMI at 61 . Colon cancer Brother 774 . Gout Brother   . Ulcers Mother   . Emphysema Mother 767 . Colon polyps Sister   .  Cancer Sister        Basal cell carcinoma  . Pneumonia Maternal Grandmother   . Hypertension Brother   . Hyperlipidemia Brother   . Cancer Brother        Skin  . Diabetes Neg Hx   . Stroke Neg Hx     Social History   Tobacco Use  . Smoking status: Former Smoker    Packs/day: 1.50    Years: 30.00    Pack years: 45.00    Types: Cigarettes    Quit date: 08/13/1993    Years since quitting: 27.2  . Smokeless tobacco: Never Used  Vaping Use  . Vaping Use: Never used  Substance Use Topics  . Alcohol use: No  . Drug use: No    Home Medications Prior to Admission medications   Medication Sig Start Date End Date Taking? Authorizing Provider  ascorbic acid (VITAMIN C) 1000 MG tablet Take 1,000 mg by mouth daily.   Yes [provider]  canagliflozin (INVOKANA) 100 MG  TABS tablet Take 1 tablet (100 mg total) by mouth daily before breakfast. 10/07/20  Yes Hoyt Koch, MD  celecoxib (CELEBREX) 200 MG capsule TAKE (1) CAPSULE TWICE DAILY. 10/11/20  Yes Hoyt Koch, MD  colestipol (COLESTID) 1 g tablet Take 1 g by mouth as needed (stomach). 02/10/20  Yes [provider]  diltiazem (CARDIZEM CD) 180 MG 24 hr capsule TAKE 1 CAPSULE DAILY. Patient taking differently: Take 180 mg by mouth daily. 12/22/19  Yes Lorretta Harp, MD  famotidine (PEPCID) 20 MG tablet Take 20 mg by mouth 2 (two) times daily. 02/10/20  Yes [provider]  gabapentin (NEURONTIN) 100 MG capsule TAKE (1) CAPSULE THREE TIMES DAILY. Patient taking differently: Take 100 mg by mouth 2 (two) times daily. 08/29/20  Yes Hoyt Koch, MD  latanoprost (XALATAN) 0.005 % ophthalmic solution Place 1 drop into both eyes at bedtime. 12/09/13  Yes [provider]  Multiple Vitamins-Iron (MULTIVITAMINS WITH IRON) TABS Take 1 tablet by mouth daily.   Yes [provider]  rOPINIRole (REQUIP) 0.5 MG tablet TAKE 1 TO 2 TABLETS AT MIDNIGHT. Patient taking differently: Take 0.5-1 mg by mouth at bedtime. 10/31/20  Yes Dohmeier, Asencion Partridge, MD  torsemide (DEMADEX) 20 MG tablet TAKE (2) TABLETS TWICE DAILY. Patient taking differently: Take 40 mg by mouth 2 (two) times daily. 08/15/20  Yes Hoyt Koch, MD  traZODone (DESYREL) 100 MG tablet TAKE 1/2 TO 1 TABLET AT BEDTIME. Patient taking differently: Take 100 mg by mouth at bedtime. 08/10/20  Yes Hoyt Koch, MD  umeclidinium-vilanterol (ANORO ELLIPTA) 62.5-25 MCG/INH AEPB Inhale 1 puff into the lungs daily. 08/22/20  Yes Young, Clinton D, MD  XARELTO 20 MG TABS tablet TAKE 1 TABLET ONCE DAILY WITH SUPPER. Patient taking differently: Take 20 mg by mouth every evening. 06/27/20  Yes Croitoru, Mihai, MD  benzonatate (TESSALON) 200 MG capsule Take by mouth. 08/24/20   [provider]   CALCIUM-MAG-VIT C-VIT D PO Take 1 tablet by mouth daily.    [provider]  Continuous Blood Gluc Sensor (FREESTYLE LIBRE 14 DAY SENSOR) MISC FreeStyle Libre 14 Day Sensor kit    [provider]  guaiFENesin (MUCINEX) 600 MG 12 hr tablet Take 600 mg by mouth 2 (two) times daily as needed for cough.     [provider]  HYDROcodone-acetaminophen (NORCO/VICODIN) 5-325 MG tablet Take 1 tablet by mouth at bedtime. 08/16/20   Marrian Salvage, FNP  isosorbide  mononitrate (IMDUR) 30 MG 24 hr tablet 1/2 tablet    [provider]  LORazepam (ATIVAN) 0.5 MG tablet TAKE 1 OR 2 TABLETS AT BEDTIME. 06/28/20   Hoyt Koch, MD  metoprolol succinate (TOPROL-XL) 100 MG 24 hr tablet TAKE ONE TABLET TWICE A DAY WITH OR IMMEDIATELY FOLLOWING A MEAL. 05/11/20   Croitoru, Mihai, MD  metoprolol tartrate (LOPRESSOR) 25 MG tablet Take one tablet daily as needed for rapid palpitations. 04/12/20   Deberah Pelton, NP  Multiple Minerals-Vitamins (CALCIUM CITRATE PLUS/MAGNESIUM PO) 1 tablet    [provider]  nystatin ointment (MYCOSTATIN) Apply 1 application topically 2 (two) times daily as needed (IRRITATION).  07/09/16   [provider]  NYSTATIN PO     [provider]  omega-3 acid ethyl esters (LOVAZA) 1 g capsule 1 tablet    [provider]  omeprazole (PRILOSEC) 20 MG capsule Take 1 capsule (20 mg total) by mouth 2 (two) times daily before a meal. 12/31/18   Eliseo Squires, Tomi Bamberger, DO  omeprazole (PRILOSEC) 20 MG capsule 2 capsule    [provider]  ONETOUCH DELICA LANCETS FINE MISC 1 Units by Does not apply route as directed. 10/05/11   Midge Minium, MD  PARoxetine (PAXIL) 10 MG tablet TAKE 2 AND 1/2 TABS DAILY 07/28/20   Hoyt Koch, MD  POTASSIUM BICARBONATE PO 2 tablets    [provider]  potassium chloride (MICRO-K) 10 MEQ CR capsule TAKE (2) CAPSULES DAILY. 10/23/19   Croitoru, Mihai, MD  quinapril  (ACCUPRIL) 40 MG tablet TAKE 1 TABLET ONCE DAILY. 09/15/20   Croitoru, Mihai, MD  rOPINIRole (REQUIP XL) 2 MG 24 hr tablet Take 1 tablet (2 mg total) by mouth at bedtime. Patient not taking: Reported on 11/03/2020 11/02/19   Dohmeier, Asencion Partridge, MD  rosuvastatin (CRESTOR) 10 MG tablet Take 1 tablet (10 mg total) by mouth daily. 12/07/19 04/12/20  Croitoru, Mihai, MD  senna (SENOKOT) 8.6 MG TABS Take 1 tablet by mouth at bedtime.    [provider]  sitaGLIPtin-metformin (JANUMET) 50-1000 MG tablet Take 1 tablet by mouth 2 (two) times daily with a meal. TAKE 1/2 TABLET IN THE MORNING WITH BREAKFAST AND 1 TABLET IN THE EVENING WITH SUPPER. 07/11/20   Hoyt Koch, MD    Allergies    Benadryl [diphenhydramine hcl], Clopidogrel bisulfate, and Contrast media [iodinated diagnostic agents]  Review of Systems   Review of Systems  Constitutional: Positive for fatigue. Negative for chills and fever.  HENT: Negative for congestion and sore throat.   Respiratory: Positive for shortness of breath.   Cardiovascular: Positive for chest pain.  Gastrointestinal: Positive for diarrhea. Negative for abdominal pain, nausea and vomiting.  Genitourinary: Negative for enuresis.  Musculoskeletal: Negative for back pain.  Skin: Negative for rash.  Neurological: Negative for dizziness and headaches.  Hematological: Does not bruise/bleed easily.    Physical Exam Updated Vital Signs BP 130/63   Pulse (!) 114   Temp 97.8 F (36.6 C) (Oral)   Resp 15   SpO2 97%   Physical Exam Vitals and nursing note reviewed.  Constitutional:      General: She is not in acute distress.    Appearance: She is not ill-appearing.  HENT:     Head: Normocephalic and atraumatic.     Nose: No congestion.  Eyes:     Conjunctiva/sclera: Conjunctivae normal.  Cardiovascular:     Rate and Rhythm: Tachycardia present. Rhythm irregular.     Pulses: Normal  pulses.     Heart sounds: No murmur heard. No friction rub. No  gallop.   Pulmonary:     Effort: No respiratory distress.     Breath sounds: No wheezing, rhonchi or rales.  Abdominal:     Palpations: Abdomen is soft.     Tenderness: There is abdominal tenderness. There is no right CVA tenderness or left CVA tenderness.     Comments: Patient abdomen is palpated, is nondistended, not a bowel sounds, patient is some slight epigastric tenderness, negative Murphy sign, McBurney point, no rebound tenderness or peritoneal sign.  Musculoskeletal:     Right lower leg: No edema.     Left lower leg: No edema.  Skin:    General: Skin is warm and dry.  Neurological:     Mental Status: She is alert.  Psychiatric:        Mood and Affect: Mood normal.     ED Results / Procedures / Treatments   Labs (all labs ordered are listed, but only abnormal results are displayed) Labs Reviewed  CBC - Abnormal; Notable for the following components:      Result Value   RBC 2.94 (*)    Hemoglobin 7.0 (*)    HCT 24.0 (*)    MCH 23.8 (*)    MCHC 29.2 (*)    RDW 25.5 (*)    All other components within normal limits  COMPREHENSIVE METABOLIC PANEL - Abnormal; Notable for the following components:   Sodium 130 (*)    Chloride 95 (*)    Glucose, Bld 137 (*)    Creatinine, Ser 1.36 (*)    GFR, Estimated 41 (*)    All other components within normal limits  PROTIME-INR - Abnormal; Notable for the following components:   Prothrombin Time 16.2 (*)    INR 1.4 (*)    All other components within normal limits  URINALYSIS, ROUTINE W REFLEX MICROSCOPIC - Abnormal; Notable for the following components:   APPearance HAZY (*)    Specific Gravity, Urine 1.002 (*)    Glucose, UA >=500 (*)    Hgb urine dipstick SMALL (*)    Nitrite POSITIVE (*)    Leukocytes,Ua LARGE (*)    WBC, UA >50 (*)    Bacteria, UA RARE (*)    All other components within normal limits  POC OCCULT BLOOD, ED - Abnormal; Notable for the following components:   Fecal Occult Bld POSITIVE (*)    All other  components within normal limits  RESP PANEL BY RT-PCR (FLU A&B, COVID) ARPGX2  TYPE AND SCREEN  PREPARE RBC (CROSSMATCH)  TROPONIN I (HIGH SENSITIVITY)  TROPONIN I (HIGH SENSITIVITY)    EKG None  Radiology DG Chest Port 1 View  Result Date: 11/03/2020 CLINICAL DATA:  Low hemoglobin and shortness of breath EXAM: PORTABLE CHEST 1 VIEW COMPARISON:  09/30/2020 FINDINGS: Mild scarring at the lingula, also seen on 2020 chest CT. There is no edema, consolidation, effusion, or pneumothorax. Stable borderline heart size for technique. Atherosclerotic calcification of the aorta. Artifact from EKG leads. IMPRESSION: No evidence of active disease. Electronically Signed   By: Monte Fantasia M.D.   On: 11/03/2020 10:29    Procedures .Critical Care Performed by: Marcello Fennel, PA-C Authorized by: Marcello Fennel, PA-C   Critical care provider statement:    Critical care time (minutes):  45   Critical care time was exclusive of:  Separately billable procedures and treating other patients   Critical care was necessary to treat  or prevent imminent or life-threatening deterioration of the following conditions:  Circulatory failure   Critical care was time spent personally by me on the following activities:  Discussions with consultants, evaluation of patient's response to treatment, examination of patient, ordering and performing treatments and interventions, ordering and review of laboratory studies, ordering and review of radiographic studies, pulse oximetry, re-evaluation of patient's condition and review of old charts   I assumed direction of critical care for this patient from another provider in my specialty: no     Care discussed with: admitting provider       Medications Ordered in ED Medications  0.9 %  sodium chloride infusion (has no administration in time range)  polyethylene glycol-electrolytes (NuLYTELY) solution 4,000 mL (has no administration in time range)  sodium  chloride flush (NS) 0.9 % injection 3 mL (has no administration in time range)  acetaminophen (TYLENOL) tablet 650 mg (has no administration in time range)    Or  acetaminophen (TYLENOL) suppository 650 mg (has no administration in time range)  ondansetron (ZOFRAN) tablet 4 mg (has no administration in time range)    Or  ondansetron (ZOFRAN) injection 4 mg (has no administration in time range)  albuterol (PROVENTIL) (2.5 MG/3ML) 0.083% nebulizer solution 2.5 mg (has no administration in time range)  pantoprazole (PROTONIX) injection 40 mg (40 mg Intravenous Given 11/03/20 1022)    ED Course  I have reviewed the triage vital signs and the nursing notes.  Pertinent labs & imaging results that were available during my care of the patient were reviewed by me and considered in my medical decision making (see chart for details).    MDM Rules/Calculators/A&P                         Initial impression-patient presents with concerns of anemia.  She is alert, does not appear in acute distress, vital signs notable for A. fib heart rate in 100s.  Concern for possible GI bleed, will obtain GI work-up, type and screen, start her on Protonix and reassess.  Work-up-CBC shows normocytic anemia with a hemoglobin of 7, baseline between 9 and 10.  CMP shows hyponatremia 130 appears to be baseline for patient, creatinine 1.36 at baseline per patient, Hemoccult positive, INR 1.4, prothrombin time 16.2.  UA shows positive nitrates, leukocytes, many white blood cells rare bacteria.  Chest x-ray negative for acute findings.  EKG is A. fib without signs of ischemia no ST elevation depression noted.  Reassessment patient's hemoglobin was noted at 7 decreased 1.7 g in 1 month's time  recommends blood transfusion as well as admission due to lower GI bleed.  Patient is agreeable to this discussed also discussed recent diagnosis of a blood transfusion patient was agreeable   patient is reassessed after the start of her  blood transfusion has no complaints at this time, vital signs remained stable.  Consult due to Hemoccult positive as well as low hemoglobin suspect patient suffered from a lower GI bleed will consult with GI for further recommendations.  Spoke with Dr. Duane Boston of [facility] he agrees with medicine admission, 2 units of blood, hold Xarelto, make patient n.p.o. at midnight.  he will come down and assess the patient.  Spoke with Dr. Eustaquio Boyden of the hospitalist team he has accepted the patient come down and evaluate.  Rule out-I have low suspicion for ACS as history is atypical, EKG is without signs of ischemia, first troponin is 5.  Second troponin pending  at this time.  low suspicion for PE as patient is anticoagulated, has a risk factors, history is more consistent with chronic anemia.  Low suspicion for intra-abdominal bleed requiring immediate intervention as abdomen is soft nontender to palpation, no peritoneal signs on my exam, patient has no history of diverticulitis, stomach ulcers, Crohn's disease or UC. Low  Suspicion for systemic infection as patient is nontoxic-appearing, vital signs reassuring.  Patient does have a noted UA consistent with UTI, will culture and start on antibiotics.  Plan-will admit patient to medicine for anemia secondary to lower GI bleed.  Anticipate patient will need further GI work-up and antibiotics for UTI.  Patient will be transferred to hospital team     Final Clinical Impression(s) / ED Diagnoses Final diagnoses:  Lower GI bleed  Symptomatic anemia    Rx / DC Orders ED Discharge Orders    None       Marcello Fennel, PA-C 11/03/20 1133    Davonna Belling, MD 11/03/20 [phone]

## 2020-11-03 NOTE — Progress Notes (Signed)
1 unit PRBC started in ED and completed on 6N at 1315. Pt tolerated transfusion well.  She was unsteady on her feet on arrival, so alarms were used and pt was encouraged to hit call button.  Pt's wife was bedside today. Pt has anxiety, and wife helps to keep her calm.  Pt pulse rate was 132 on arrival. Notified Dr. Tamala Julian. He ordered one time dose metoprolol 2.5 mg IV. After administering, pulse rate dropped to 74.  Pt anxious because her regular medications had not yet been put in the [facility].  As they were added in the [facility], they were administered.  Pt continuing to complete bowel prep at shift change.  Will be NPO at midnight for EGD and colonoscopy at 0800 on 03/25.

## 2020-11-03 NOTE — ED Notes (Signed)
Attempted to give report x2 

## 2020-11-03 NOTE — Consult Note (Signed)
Referring Provider: [facility] Primary Care Physician:  Hoyt Koch, MD Primary Gastroenterologist:  Dr. Ronald Lobo  Reason for Consultation: Profound iron deficiency anemia  HPI: SHINE SCROGHAM is a 70 y.o. female being admitted through the emergency room because of severe iron deficiency anemia.    The patient has multiple medical issues including atrial fibrillation with chronic anticoagulation on Xarelto, diabetes, dyslipidemia, coronary disease (EF 50% when checked last year), and chronic renal insufficiency.  The patient is well-known to my practice.  She was seen 3 days ago by our physician assistant for routine visit, to address problems of chronic diarrhea (helped by [facility]), dyspepsia, and iron deficiency anemia.  At that time the patient reported no overt GI bleeding other than small amounts of bright red blood on the toilet tissue, and she was not really complaining of anemic symptoms.  She had received an iron infusion through her primary physician's office just a week earlier, because of a recent hemoglobin of 7.5.  Rectal exam in the office had shown just trace heme positive stool.  However, as part of her office evaluation, our physician assistant did obtain a CBC and iron studies, which came back showing a worsening hemoglobin of 6.0, although iron studies were normal (presumably as a consequence of the recent iron infusion).  I called the patient that evening because of the critical lab value, and conversation with the patient indicated that she was really quite symptomatic, having to hold onto furniture to walk around her home.  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  I encouraged her to come to the emergency room for management but it took several days for her to clear her schedule and consent to coming in.  In the emergency room today, her hemoglobin is moderately improved  compared to 3 days ago, at 7.0.  It is not clear whether this improvement reflects laboratory variation or perhaps a beneficial effect of the iron infusion she received approximately a week ago.  The patient's most recent GI evaluation for iron deficiency anemia was in June 2019, at which time endoscopy showed friable gastric mucosa and colonoscopy showed proximal colonic erythema.  Previous endoscopy and colonoscopy in December 2009 and again in January 2016 had disclosed no major abnormalities although the more recent of those studies did show an AVM in the proximal colon and a solitary diminutive adenomatous polyp.  Of note, the patient is on Celebrex which could cause GI ulceration and bleeding, especially in the setting of anticoagulation, but she also takes omeprazole twice daily which would tend to offset the tendency for upper tract ulceration.  Symptomatically, the patient does not have much to report other than some nausea and anorexia, without weight loss, over the past month, possibly stress related.  Also, some degree of alternating constipation and diarrhea with some slight rectal bleeding.  Past Medical History:  Diagnosis Date  . Anemia, unspecified   . Arthritis   . Asthma   . Atrial fibrillation with RVR ([place])    a. on Xarelto  . Bell's palsy    Facial nerve decompression in 2001  . CHF (congestive heart failure) ([place])   . Chronic low back pain   . COPD with asthma ([place])   . Coronary artery disease    Myoview 04/12/11 was entirely normal. ECHO 02/26/08 showed only minor abnormalities. Stenting 05/26/08 of her posterolateral branch to the left circumflex coronary artery.  Used a 2.5x25m Taxus Monorail stent.myoview 2014 was without ischemia  . Diabetes mellitus    Type 2  . Early cataracts, bilateral   . Fatty liver   . GERD (gastroesophageal reflux disease)   . Glaucoma   . Glaucoma   . Goiter   . Heart murmur   . History of nuclear stress test 2012; 2014   lexiscan;  normal pattern of perfusion; normal, low risk scan   . Hyperlipidemia   . Hypertension   . Panic disorder   . Pneumonia 2008  . Polycystic ovary disease    Hysterectomy in 1982 for this  . Shortness of breath dyspnea    ECHO 02/26/08 showed only minor abnormalities  . Spinal stenosis     Past Surgical History:  Procedure Laterality Date  . ABDOMINAL HYSTERECTOMY  1982   & BSO; for polycystic ovary disease  . CARDIOVERSION N/A 12/17/2013   Procedure: CARDIOVERSION;  Surgeon: KPixie Casino MD;  [facility];  Service: Cardiovascular;  Laterality: N/A;  . CARDIOVERSION N/A 09/30/2015   Procedure: CARDIOVERSION;  Surgeon: KDorothy Spark MD;  [facility];  Service: Cardiovascular;  Laterality: N/A;  . CARDIOVERSION N/A 06/22/2016   Procedure: CARDIOVERSION;  Surgeon: TSkeet Latch MD;  [facility];  Service: Cardiovascular;  Laterality: N/A;  . CENTRAL LINE INSERTION  01/13/2019   Procedure: CENTRAL LINE INSERTION;  Surgeon: BJolaine Artist MD;  [facility];  Service: Cardiovascular;;  . COLONOSCOPY     last 2009; Dr BCristina Gong due 2019  . COLONOSCOPY WITH PROPOFOL N/A 01/23/2018   Procedure: COLONOSCOPY WITH PROPOFOL;  Surgeon: BRonald Lobo MD;  [facility] [facility]  Service: Endoscopy;  Laterality: N/A;  . CORONARY ANGIOPLASTY  05/26/2008   Stenting of her posterolateral branch to the left circumflex coronary artery. Used a 2.5x16mTaxus Monorail stent.  . ESOPHAGOGASTRODUODENOSCOPY (EGD) WITH PROPOFOL N/A 01/23/2018   Procedure: ESOPHAGOGASTRODUODENOSCOPY (EGD) WITH PROPOFOL;  Surgeon: BuRonald LoboMD;  [facility] Service: Endoscopy;  Laterality: N/A;  . FACIAL NERVE DECOMPRESSION  2001/2002   bells palsy   . LAPAROSCOPIC CHOLECYSTECTOMY  06/15/2011    Dr InDalbert Batman. PERICARDIOCENTESIS N/A 01/13/2019   Procedure: PERICARDIOCENTESIS;  Surgeon: McBurnell BlanksMD;  [place] [facility];   Service: Cardiovascular;  Laterality: N/A;  . RIGHT AND LEFT HEART CATH N/A 01/13/2019   Procedure: RIGHT AND LEFT HEART CATH;  Surgeon: BeJolaine ArtistMD;  [facility];  Service: Cardiovascular;  Laterality: N/A;  . TEE WITHOUT CARDIOVERSION N/A 12/17/2013   Procedure: TRANSESOPHAGEAL ECHOCARDIOGRAM (TEE);  Surgeon: KePixie CasinoMD;  [facility];  Service: Cardiovascular;  Laterality: N/A;  trish/ja  . TRANSTHORACIC ECHOCARDIOGRAM  02/18/28/5621 LV systolic function normal with mild conc LVH; LA mildly dilated; trace MR/TR  . UPPER GI ENDOSCOPY  2009   negative    Prior to Admission medications   Medication Sig Start Date End Date Taking? Authorizing Provider  amoxicillin (AMOXIL) 875 MG tablet Take 875 mg by mouth 2 (two) times daily. Patient not taking: Reported on 09/30/2020 06/22/20   [provider]  ascorbic acid (VITAMIN C) 1000 MG tablet Take 1,000 mg by mouth daily.    [provider]  benzonatate (TESSALON) 200 MG capsule Take by mouth. 08/24/20   [provider]  CALCIUM-MAG-VIT C-VIT D PO Take 1 tablet by mouth daily.    [provider]  canagliflozin (INVOKANA) 100 MG TABS tablet  Take 1 tablet (100 mg total) by mouth daily before breakfast. 10/07/20   Hoyt Koch, MD  celecoxib (CELEBREX) 200 MG capsule TAKE (1) CAPSULE TWICE DAILY. 10/11/20   Hoyt Koch, MD  colestipol (COLESTID) 1 g tablet Take 1 g by mouth 2 (two) times daily. Patient not taking: Reported on 09/30/2020 02/10/20   [provider]  Continuous Blood Gluc Sensor (FREESTYLE LIBRE 14 DAY SENSOR) MISC FreeStyle Libre 14 Day Sensor kit    [provider]  diltiazem (CARDIZEM CD) 180 MG 24 hr capsule TAKE 1 CAPSULE DAILY. 12/22/19   Lorretta Harp, MD  famotidine (PEPCID) 20 MG tablet Take 20 mg by mouth 2 (two) times daily. 02/10/20   [provider]  gabapentin (NEURONTIN) 100 MG capsule TAKE (1) CAPSULE  THREE TIMES DAILY. 08/29/20   Hoyt Koch, MD  guaiFENesin (MUCINEX) 600 MG 12 hr tablet Take 600 mg by mouth 2 (two) times daily as needed for cough.     [provider]  HYDROcodone-acetaminophen (NORCO/VICODIN) 5-325 MG tablet Take 1 tablet by mouth at bedtime. 08/16/20   Marrian Salvage, FNP  isosorbide mononitrate (IMDUR) 30 MG 24 hr tablet 1/2 tablet    [provider]  latanoprost (XALATAN) 0.005 % ophthalmic solution Place 1 drop into both eyes at bedtime. 12/09/13   [provider]  levalbuterol Penne Lash) 0.63 MG/3ML nebulizer solution 3 ml    [provider]  LORazepam (ATIVAN) 0.5 MG tablet TAKE 1 OR 2 TABLETS AT BEDTIME. 06/28/20   Hoyt Koch, MD  metoprolol succinate (TOPROL-XL) 100 MG 24 hr tablet TAKE ONE TABLET TWICE A DAY WITH OR IMMEDIATELY FOLLOWING A MEAL. 05/11/20   Croitoru, Mihai, MD  metoprolol tartrate (LOPRESSOR) 25 MG tablet Take one tablet daily as needed for rapid palpitations. 04/12/20   Deberah Pelton, NP  Multiple Minerals-Vitamins (CALCIUM CITRATE PLUS/MAGNESIUM PO) 1 tablet    [provider]  Multiple Vitamins-Iron (MULTIVITAMINS WITH IRON) TABS Take 1 tablet by mouth daily.    [provider]  nystatin ointment (MYCOSTATIN) Apply 1 application topically 2 (two) times daily as needed (IRRITATION).  07/09/16   [provider]  NYSTATIN PO     [provider]  omega-3 acid ethyl esters (LOVAZA) 1 g capsule 1 tablet    [provider]  omeprazole (PRILOSEC) 20 MG capsule Take 1 capsule (20 mg total) by mouth 2 (two) times daily before a meal. 12/31/18   Eliseo Squires, Tomi Bamberger, DO  omeprazole (PRILOSEC) 20 MG capsule 2 capsule    [provider]  ONETOUCH DELICA LANCETS FINE MISC 1 Units by Does not apply route as directed. 10/05/11   Midge Minium, MD  PARoxetine (PAXIL) 10 MG tablet TAKE 2 AND 1/2 TABS DAILY 07/28/20   Hoyt Koch, MD  POTASSIUM  BICARBONATE PO 2 tablets    [provider]  potassium chloride (MICRO-K) 10 MEQ CR capsule TAKE (2) CAPSULES DAILY. 10/23/19   Croitoru, Mihai, MD  quinapril (ACCUPRIL) 40 MG tablet TAKE 1 TABLET ONCE DAILY. 09/15/20   Croitoru, Mihai, MD  rOPINIRole (REQUIP XL) 2 MG 24 hr tablet Take 1 tablet (2 mg total) by mouth at bedtime. 11/02/19   Dohmeier, Asencion Partridge, MD  rOPINIRole (REQUIP) 0.5 MG tablet TAKE 1 TO 2 TABLETS AT MIDNIGHT. 10/31/20   Dohmeier, Asencion Partridge, MD  rosuvastatin (CRESTOR) 10 MG tablet Take 1 tablet (10 mg total) by mouth daily. 12/07/19 04/12/20  Croitoru, Dani Gobble, MD  senna Donavan Burnet)  8.6 MG TABS Take 1 tablet by mouth at bedtime.    [provider]  sitaGLIPtin-metformin (JANUMET) 50-1000 MG tablet Take 1 tablet by mouth 2 (two) times daily with a meal. TAKE 1/2 TABLET IN THE MORNING WITH BREAKFAST AND 1 TABLET IN THE EVENING WITH SUPPER. 07/11/20   Hoyt Koch, MD  torsemide (DEMADEX) 20 MG tablet TAKE (2) TABLETS TWICE DAILY. 08/15/20   Hoyt Koch, MD  traZODone (DESYREL) 100 MG tablet TAKE 1/2 TO 1 TABLET AT BEDTIME. 08/10/20   Hoyt Koch, MD  umeclidinium-vilanterol [facility] ELLIPTA) 62.5-25 MCG/INH AEPB Inhale 1 puff into the lungs daily. 08/22/20   Young, Kasandra Knudsen, MD  XARELTO 20 MG TABS tablet TAKE 1 TABLET ONCE DAILY WITH SUPPER. 06/27/20   Croitoru, Dani Gobble, MD    Current Facility-Administered Medications  Medication Dose Route Frequency Provider Last Rate Last Admin  . 0.9 %  sodium chloride infusion  10 mL/hr Intravenous Once Marcello Fennel, PA-C      . polyethylene glycol-electrolytes (NuLYTELY) solution 4,000 mL  4,000 mL Oral Once Gilverto Dileonardo, Herbie Baltimore, MD       Current Outpatient Medications  Medication Sig Dispense Refill  . amoxicillin (AMOXIL) 875 MG tablet Take 875 mg by mouth 2 (two) times daily. (Patient not taking: Reported on 09/30/2020)    . ascorbic acid (VITAMIN C) 1000 MG tablet Take 1,000 mg by mouth daily.    . benzonatate  (TESSALON) 200 MG capsule Take by mouth.    [name] CALCIUM-MAG-VIT C-VIT D PO Take 1 tablet by mouth daily.    . canagliflozin (INVOKANA) 100 MG TABS tablet Take 1 tablet (100 mg total) by mouth daily before breakfast. 90 tablet 3  . celecoxib (CELEBREX) 200 MG capsule TAKE (1) CAPSULE TWICE DAILY. 180 capsule 0  . colestipol (COLESTID) 1 g tablet Take 1 g by mouth 2 (two) times daily. (Patient not taking: Reported on 09/30/2020)    . Continuous Blood Gluc Sensor (FREESTYLE LIBRE 14 DAY SENSOR) MISC FreeStyle Libre 14 Day Sensor kit    . diltiazem (CARDIZEM CD) 180 MG 24 hr capsule TAKE 1 CAPSULE DAILY. 90 capsule 3  . famotidine (PEPCID) 20 MG tablet Take 20 mg by mouth 2 (two) times daily.    [name] gabapentin (NEURONTIN) 100 MG capsule TAKE (1) CAPSULE THREE TIMES DAILY. 180 capsule 0  . guaiFENesin (MUCINEX) 600 MG 12 hr tablet Take 600 mg by mouth 2 (two) times daily as needed for cough.     [name] HYDROcodone-acetaminophen (NORCO/VICODIN) 5-325 MG tablet Take 1 tablet by mouth at bedtime. 30 tablet 0  . isosorbide mononitrate (IMDUR) 30 MG 24 hr tablet 1/2 tablet    . latanoprost (XALATAN) 0.005 % ophthalmic solution Place 1 drop into both eyes at bedtime.    . levalbuterol (XOPENEX) 0.63 MG/3ML nebulizer solution 3 ml    . LORazepam (ATIVAN) 0.5 MG tablet TAKE 1 OR 2 TABLETS AT BEDTIME. 60 tablet 5  . metoprolol succinate (TOPROL-XL) 100 MG 24 hr tablet TAKE ONE TABLET TWICE A DAY WITH OR IMMEDIATELY FOLLOWING A MEAL. 180 tablet 1  . metoprolol tartrate (LOPRESSOR) 25 MG tablet Take one tablet daily as needed for rapid palpitations. 30 tablet 3  . Multiple Minerals-Vitamins (CALCIUM CITRATE PLUS/MAGNESIUM PO) 1 tablet    . Multiple Vitamins-Iron (MULTIVITAMINS WITH IRON) TABS Take 1 tablet by mouth daily.    [name] nystatin ointment (MYCOSTATIN) Apply 1 application topically 2 (two) times daily as needed (IRRITATION).   0  . NYSTATIN  PO     . omega-3 acid ethyl esters (LOVAZA) 1 g capsule 1 tablet    .  omeprazole (PRILOSEC) 20 MG capsule Take 1 capsule (20 mg total) by mouth 2 (two) times daily before a meal.    . omeprazole (PRILOSEC) 20 MG capsule 2 capsule    . ONETOUCH DELICA LANCETS FINE MISC 1 Units by Does not apply route as directed. 100 each 3  . PARoxetine (PAXIL) 10 MG tablet TAKE 2 AND 1/2 TABS DAILY 225 tablet 0  . POTASSIUM BICARBONATE PO 2 tablets    . potassium chloride (MICRO-K) 10 MEQ CR capsule TAKE (2) CAPSULES DAILY. 60 capsule 8  . quinapril (ACCUPRIL) 40 MG tablet TAKE 1 TABLET ONCE DAILY. 90 tablet 1  . rOPINIRole (REQUIP XL) 2 MG 24 hr tablet Take 1 tablet (2 mg total) by mouth at bedtime. 30 tablet 5  . rOPINIRole (REQUIP) 0.5 MG tablet TAKE 1 TO 2 TABLETS AT MIDNIGHT. 60 tablet 0  . rosuvastatin (CRESTOR) 10 MG tablet Take 1 tablet (10 mg total) by mouth daily. 90 tablet 3  . senna (SENOKOT) 8.6 MG TABS Take 1 tablet by mouth at bedtime.    . sitaGLIPtin-metformin (JANUMET) 50-1000 MG tablet Take 1 tablet by mouth 2 (two) times daily with a meal. TAKE 1/2 TABLET IN THE MORNING WITH BREAKFAST AND 1 TABLET IN THE EVENING WITH SUPPER. 45 tablet 7  . torsemide (DEMADEX) 20 MG tablet TAKE (2) TABLETS TWICE DAILY. 360 tablet 0  . traZODone (DESYREL) 100 MG tablet TAKE 1/2 TO 1 TABLET AT BEDTIME. 90 tablet 3  . umeclidinium-vilanterol (ANORO ELLIPTA) 62.5-25 MCG/INH AEPB Inhale 1 puff into the lungs daily. 60 each 12  . XARELTO 20 MG TABS tablet TAKE 1 TABLET ONCE DAILY WITH SUPPER. 90 tablet 3    Allergies as of 11/03/2020 - Review Complete 11/03/2020  Allergen Reaction Noted  . Benadryl [diphenhydramine hcl] Other (See Comments) 06/15/2011  . Clopidogrel bisulfate Nausea Only   . Contrast media [iodinated diagnostic agents] Palpitations 06/15/2011    Family History  Problem Relation Age of Onset  . Heart attack Father 64       2nd MI at 69  . Colon cancer Brother 33  . Gout Brother   . Ulcers Mother   . Emphysema Mother 22  . Colon polyps Sister   . Cancer  Sister        Basal cell carcinoma  . Pneumonia Maternal Grandmother   . Hypertension Brother   . Hyperlipidemia Brother   . Cancer Brother        Skin  . Diabetes Neg Hx   . Stroke Neg Hx     Social History   Socioeconomic History  . Marital status: Married    Spouse name: Not on file  . Number of children: 2  . Years of education: master's  . Highest education level: Not on file  Occupational History  . Occupation: Electrical engineer    Employer: [employer]  Tobacco Use  . Smoking status: Former Smoker    Packs/day: 1.50    Years: 30.00    Pack years: 45.00    Types: Cigarettes    Quit date: 08/13/1993    Years since quitting: 27.2  . Smokeless tobacco: Never Used  Vaping Use  . Vaping Use: Never used  Substance and Sexual Activity  . Alcohol use: No  . Drug use: No  . Sexual activity: Not on file  Other Topics Concern  .  Not on file  Social History Narrative  . Not on file   Social Determinants of Health   Financial Resource Strain: Not on file  Food Insecurity: Not on file  Transportation Needs: Not on file  Physical Activity: Not on file  Stress: Not on file  Social Connections: Not on file  Intimate Partner Violence: Not on file    Review of Systems: Pertinent for severe dyspnea on mild exertion, no chest pain, no focal neurologic symptoms, but "weak in her legs"  Physical Exam: Vital signs in last 24 hours: Temp:  [98.8 F (37.1 C)] 98.8 F (37.1 C) (03/24 0907) Pulse Rate:  [90-119] 104 (03/24 1030) Resp:  [14-18] 15 (03/24 1030) BP: (124-151)/(48-68) 135/60 (03/24 1030) SpO2:  [96 %-100 %] 96 % (03/24 1030)   General:   Alert,  Well-developed, well-nourished, pleasant and cooperative in NAD Head:  Normocephalic and atraumatic. Lungs:  Clear throughout to auscultation.   No wheezes, crackles, or rhonchi. No evident respiratory distress. Heart:   Irregular rhythm consistent with known A. fib Abdomen: Severely overweight, adipose,  nontender, no evident mass or tenderness Rectal: Performed by our physician assistant in the office 4 days ago, showed trace heme positive stool Msk:   Symmetrical without gross deformities. Extremities:   At most, trace tibial edema. Neurologic:  Alert and coherent;  grossly normal neurologically. Skin:  Intact without significant lesions or rashes. Psych:   Alert and cooperative. Normal mood and affect.  Intake/Output from previous day: No intake/output data recorded. Intake/Output this shift: No intake/output data recorded.  Lab Results: Recent Labs    11/03/20 0914  WBC 6.6  HGB 7.0*  HCT 24.0*  PLT 253   BMET Recent Labs    11/03/20 0914  NA 130*  K 4.0  CL 95*  CO2 25  GLUCOSE 137*  BUN 15  CREATININE 1.36*  CALCIUM 9.1   LFT Recent Labs    11/03/20 0914  PROT 7.3  ALBUMIN 3.7  AST 19  ALT 12  ALKPHOS 62  BILITOT 0.6   PT/INR Recent Labs    11/03/20 0957  LABPROT 16.2*  INR 1.4*    Studies/Results: DG Chest Port 1 View  Result Date: 11/03/2020 CLINICAL DATA:  Low hemoglobin and shortness of breath EXAM: PORTABLE CHEST 1 VIEW COMPARISON:  09/30/2020 FINDINGS: Mild scarring at the lingula, also seen on 2020 chest CT. There is no edema, consolidation, effusion, or pneumothorax. Stable borderline heart size for technique. Atherosclerotic calcification of the aorta. Artifact from EKG leads. IMPRESSION: No evidence of active disease. Electronically Signed   By: Monte Fantasia M.D.   On: 11/03/2020 10:29    Impression: 1.  Recurrent iron deficiency anemia 2.  Heme positive stool 3.  Chronic anticoagulation, atrial fibrillation, diabetes, coronary disease with preserved ejection fraction 4.  Celebrex exposure, PPI coverage 5.  Previous history of solitary diminutive colonic adenoma and questionable gastritis, proximal colonic erythema, and proximal colonic AVM on previous studies, of uncertain relevance to current clinical presentation.  Plan: Upper  endoscopy, capsule endoscopy placement, and colonoscopy tomorrow morning at 830.  The patient is familiar with endoscopy and colonoscopy from prior examination, and I explained the reasoning behind a capsule endoscopy, and the patient is agreeable to proceed with all the above procedures.  The patient has received 2 units of packed cells.  Today's dose of Xarelto will be held in view of the anticipated procedures tomorrow.  It is hoped that it will be able to be resumed tomorrow.  If the patient has an appropriate posttransfusion rise in hemoglobin and no active bleeding or major abnormalities are found on tomorrow's endoscopy or colonoscopy, I feel that discharge tomorrow evening, after her capsule endoscopy study is completed, would be clinically appropriate.  The patient is worried about cost of hospitalization and I do not think an extra night in the hospital would be needed.   LOS: 0 days   Youlanda Mighty Gary Bultman  11/03/2020, 10:47 AM   Pager [phone] If no answer or after 5 PM call [phone]

## 2020-11-04 ENCOUNTER — Encounter (HOSPITAL_COMMUNITY): Payer: Self-pay | Admitting: Internal Medicine

## 2020-11-04 ENCOUNTER — Observation Stay (HOSPITAL_COMMUNITY): Payer: HMO | Admitting: Certified Registered Nurse Anesthetist

## 2020-11-04 ENCOUNTER — Encounter (HOSPITAL_COMMUNITY)
Admission: EM | Disposition: A | Discharge status: Home / Self Care | Payer: Self-pay | Source: Home / Self Care | Attending: Emergency Medicine

## 2020-11-04 DIAGNOSIS — I4821 Permanent atrial fibrillation: Secondary | ICD-10-CM | POA: Diagnosis not present

## 2020-11-04 DIAGNOSIS — J45909 Unspecified asthma, uncomplicated: Secondary | ICD-10-CM | POA: Diagnosis not present

## 2020-11-04 DIAGNOSIS — I130 Hypertensive heart and chronic kidney disease with heart failure and stage 1 through stage 4 chronic kidney disease, or unspecified chronic kidney disease: Secondary | ICD-10-CM | POA: Diagnosis not present

## 2020-11-04 DIAGNOSIS — I2510 Atherosclerotic heart disease of native coronary artery without angina pectoris: Secondary | ICD-10-CM | POA: Diagnosis not present

## 2020-11-04 DIAGNOSIS — Z87891 Personal history of nicotine dependence: Secondary | ICD-10-CM | POA: Diagnosis not present

## 2020-11-04 DIAGNOSIS — Z20822 Contact with and (suspected) exposure to covid-19: Secondary | ICD-10-CM | POA: Diagnosis not present

## 2020-11-04 DIAGNOSIS — E1142 Type 2 diabetes mellitus with diabetic polyneuropathy: Secondary | ICD-10-CM | POA: Diagnosis not present

## 2020-11-04 DIAGNOSIS — Z7901 Long term (current) use of anticoagulants: Secondary | ICD-10-CM | POA: Diagnosis not present

## 2020-11-04 DIAGNOSIS — I5030 Unspecified diastolic (congestive) heart failure: Secondary | ICD-10-CM | POA: Diagnosis not present

## 2020-11-04 DIAGNOSIS — D500 Iron deficiency anemia secondary to blood loss (chronic): Secondary | ICD-10-CM | POA: Diagnosis not present

## 2020-11-04 DIAGNOSIS — K5520 Angiodysplasia of colon without hemorrhage: Secondary | ICD-10-CM | POA: Diagnosis not present

## 2020-11-04 DIAGNOSIS — D123 Benign neoplasm of transverse colon: Secondary | ICD-10-CM | POA: Diagnosis not present

## 2020-11-04 DIAGNOSIS — K621 Rectal polyp: Secondary | ICD-10-CM | POA: Diagnosis not present

## 2020-11-04 DIAGNOSIS — K922 Gastrointestinal hemorrhage, unspecified: Secondary | ICD-10-CM | POA: Diagnosis not present

## 2020-11-04 DIAGNOSIS — D649 Anemia, unspecified: Secondary | ICD-10-CM | POA: Diagnosis not present

## 2020-11-04 DIAGNOSIS — R195 Other fecal abnormalities: Secondary | ICD-10-CM | POA: Diagnosis not present

## 2020-11-04 DIAGNOSIS — N1832 Chronic kidney disease, stage 3b: Secondary | ICD-10-CM | POA: Diagnosis not present

## 2020-11-04 DIAGNOSIS — K635 Polyp of colon: Secondary | ICD-10-CM | POA: Diagnosis not present

## 2020-11-04 HISTORY — PX: HOT HEMOSTASIS: SHX5433

## 2020-11-04 HISTORY — PX: ESOPHAGOGASTRODUODENOSCOPY (EGD) WITH PROPOFOL: SHX5813

## 2020-11-04 HISTORY — PX: GIVENS CAPSULE STUDY: SHX5432

## 2020-11-04 HISTORY — PX: POLYPECTOMY: SHX5525

## 2020-11-04 HISTORY — PX: COLONOSCOPY WITH PROPOFOL: SHX5780

## 2020-11-04 LAB — IRON AND TIBC
Iron: 113 ug/dL (ref 28–170)
Saturation Ratios: 19 % (ref 10.4–31.8)
TIBC: 601 ug/dL — ABNORMAL HIGH (ref 250–450)
UIBC: 488 ug/dL

## 2020-11-04 LAB — MAGNESIUM: Magnesium: 1.5 mg/dL — ABNORMAL LOW (ref 1.7–2.4)

## 2020-11-04 LAB — CBC
HCT: 25.2 % — ABNORMAL LOW (ref 36.0–46.0)
HCT: 27.8 % — ABNORMAL LOW (ref 36.0–46.0)
Hemoglobin: 8.1 g/dL — ABNORMAL LOW (ref 12.0–15.0)
Hemoglobin: 8.3 g/dL — ABNORMAL LOW (ref 12.0–15.0)
MCH: 25.3 pg — ABNORMAL LOW (ref 26.0–34.0)
MCH: 24.4 pg — ABNORMAL LOW (ref 26.0–34.0)
MCHC: 32.1 g/dL (ref 30.0–36.0)
MCHC: 29.9 g/dL — ABNORMAL LOW (ref 30.0–36.0)
MCV: 78.8 fL — ABNORMAL LOW (ref 80.0–100.0)
MCV: 81.8 fL (ref 80.0–100.0)
Platelets: 245 10*3/uL (ref 150–400)
Platelets: 248 10*3/uL (ref 150–400)
RBC: 3.2 MIL/uL — ABNORMAL LOW (ref 3.87–5.11)
RBC: 3.4 MIL/uL — ABNORMAL LOW (ref 3.87–5.11)
RDW: 25.2 % — ABNORMAL HIGH (ref 11.5–15.5)
RDW: 25.4 % — ABNORMAL HIGH (ref 11.5–15.5)
WBC: 6.1 10*3/uL (ref 4.0–10.5)
WBC: 7.1 10*3/uL (ref 4.0–10.5)
nRBC: 0 % (ref 0.0–0.2)
nRBC: 0.3 % — ABNORMAL HIGH (ref 0.0–0.2)

## 2020-11-04 LAB — BASIC METABOLIC PANEL
Anion gap: 10 (ref 5–15)
BUN: 14 mg/dL (ref 8–23)
CO2: 26 mmol/L (ref 22–32)
Calcium: 9 mg/dL (ref 8.9–10.3)
Chloride: 94 mmol/L — ABNORMAL LOW (ref 98–111)
Creatinine, Ser: 1.14 mg/dL — ABNORMAL HIGH (ref 0.44–1.00)
GFR, Estimated: 51 mL/min — ABNORMAL LOW (ref >60)
Glucose, Bld: 118 mg/dL — ABNORMAL HIGH (ref 70–99)
Potassium: 4 mmol/L (ref 3.5–5.1)
Sodium: 130 mmol/L — ABNORMAL LOW (ref 135–145)

## 2020-11-04 LAB — APTT: aPTT: 34 seconds (ref 24–36)

## 2020-11-04 LAB — GLUCOSE, CAPILLARY
Glucose-Capillary: 119 mg/dL — ABNORMAL HIGH (ref 70–99)
Glucose-Capillary: 124 mg/dL — ABNORMAL HIGH (ref 70–99)
Glucose-Capillary: 162 mg/dL — ABNORMAL HIGH (ref 70–99)

## 2020-11-04 LAB — FERRITIN: Ferritin: 211 ng/mL (ref 11–307)

## 2020-11-04 LAB — PROTIME-INR
INR: 1.3 — ABNORMAL HIGH (ref 0.8–1.2)
Prothrombin Time: 16.1 seconds — ABNORMAL HIGH (ref 11.4–15.2)

## 2020-11-04 SURGERY — ESOPHAGOGASTRODUODENOSCOPY (EGD) WITH PROPOFOL
Anesthesia: Monitor Anesthesia Care

## 2020-11-04 MED ORDER — CEPHALEXIN 500 MG PO CAPS: 500.0000 mg | ORAL_CAPSULE | Freq: Two times a day (BID) | ORAL | 0 refills | Status: DC

## 2020-11-04 MED ORDER — MAGNESIUM OXIDE 400 MG PO CAPS: 1.0000 | ORAL_CAPSULE | Freq: Every day | ORAL | 0 refills | Status: DC

## 2020-11-04 MED ORDER — LACTATED RINGERS IV SOLN: INTRAVENOUS | Status: DC

## 2020-11-04 MED ORDER — SODIUM CHLORIDE 0.9 % IV SOLN
1.0000 g | INTRAVENOUS | Status: DC
  Administered 2020-11-04: 1 g via INTRAVENOUS
  Filled 2020-11-04 (×2): qty 10

## 2020-11-04 MED ORDER — PHENYLEPHRINE 40 MCG/ML (10ML) SYRINGE FOR IV PUSH (FOR BLOOD PRESSURE SUPPORT)
PREFILLED_SYRINGE | INTRAVENOUS | Status: DC | PRN
  Administered 2020-11-04 (×4): 120 ug via INTRAVENOUS

## 2020-11-04 MED ORDER — PROPOFOL 10 MG/ML IV BOLUS
INTRAVENOUS | Status: DC | PRN
  Administered 2020-11-04: 20 mg via INTRAVENOUS

## 2020-11-04 MED ORDER — ONDANSETRON HCL 4 MG/2ML IJ SOLN
INTRAMUSCULAR | Status: DC | PRN
  Administered 2020-11-04: 4 mg via INTRAVENOUS

## 2020-11-04 MED ORDER — LACTATED RINGERS IV SOLN: INTRAVENOUS | Status: DC | PRN

## 2020-11-04 MED ORDER — SODIUM CHLORIDE 0.9 % IV SOLN: INTRAVENOUS | Status: DC

## 2020-11-04 MED ORDER — AMOXICILLIN-POT CLAVULANATE 500-125 MG PO TABS: 1.0000 | ORAL_TABLET | Freq: Two times a day (BID) | ORAL | 0 refills | Status: AC

## 2020-11-04 MED ORDER — PROPOFOL 500 MG/50ML IV EMUL
INTRAVENOUS | Status: DC | PRN
  Administered 2020-11-04: 125 ug/kg/min via INTRAVENOUS

## 2020-11-04 MED ORDER — MAGNESIUM SULFATE 2 GM/50ML IV SOLN
2.0000 g | Freq: Once | INTRAVENOUS | Status: AC
  Administered 2020-11-04: 2 g via INTRAVENOUS
  Filled 2020-11-04: qty 50

## 2020-11-04 MED ORDER — RIVAROXABAN 20 MG PO TABS: 20.0000 mg | ORAL_TABLET | Freq: Every day | ORAL | 3 refills | Status: DC

## 2020-11-04 SURGICAL SUPPLY — 26 items
BLOCK BITE 60FR ADLT L/F BLUE (MISCELLANEOUS) ×3 IMPLANT
ELECT REM PT RETURN 9FT ADLT (ELECTROSURGICAL)
ELECTRODE REM PT RTRN 9FT ADLT (ELECTROSURGICAL) IMPLANT
FCP BXJMBJMB 240X2.8X (CUTTING FORCEPS)
FLOOR PAD 36X40 (MISCELLANEOUS) ×3
FORCEP RJ3 GP 1.8X160 W-NEEDLE (CUTTING FORCEPS) IMPLANT
FORCEPS BIOP RAD 4 LRG CAP 4 (CUTTING FORCEPS) IMPLANT
FORCEPS BIOP RJ4 240 W/NDL (CUTTING FORCEPS)
FORCEPS BXJMBJMB 240X2.8X (CUTTING FORCEPS) IMPLANT
INJECTOR/SNARE I SNARE (MISCELLANEOUS) IMPLANT
LUBRICANT JELLY 4.5OZ STERILE (MISCELLANEOUS) IMPLANT
MANIFOLD NEPTUNE II (INSTRUMENTS) IMPLANT
NDL SCLEROTHERAPY 25GX240 (NEEDLE) IMPLANT
NEEDLE SCLEROTHERAPY 25GX240 (NEEDLE) IMPLANT
PAD FLOOR 36X40 (MISCELLANEOUS) ×2 IMPLANT
PROBE APC STR FIRE (PROBE) IMPLANT
PROBE INJECTION GOLD (MISCELLANEOUS)
PROBE INJECTION GOLD 7FR (MISCELLANEOUS) IMPLANT
SNARE ROTATE MED OVAL 20MM (MISCELLANEOUS) IMPLANT
SNARE SHORT THROW 13M SML OVAL (MISCELLANEOUS) IMPLANT
SYR 50ML LL SCALE MARK (SYRINGE) IMPLANT
TOWEL COTTON PACK 4EA (MISCELLANEOUS) ×6 IMPLANT
TRAP SPECIMEN MUCOUS 40CC (MISCELLANEOUS) IMPLANT
TUBING ENDO SMARTCAP PENTAX (MISCELLANEOUS) ×6 IMPLANT
TUBING IRRIGATION ENDOGATOR (MISCELLANEOUS) ×3 IMPLANT
WATER STERILE IRR 1000ML POUR (IV SOLUTION) IMPLANT

## 2020-11-04 NOTE — Progress Notes (Signed)
The patient's hospitalist physician suggested the patient stay an extra day because of various medical issues including low serum sodium (a chronic problem for the patient) and more especially, a UTI, also a low magnesium that has been treated.  The patient and her wife are apparently upset because I had told them that the patient was ready to go home this afternoon (thinking only from the GI tract standpoint), so I called and was able to speak to the patient's wife on the phone (patient did not answer her phone or the hospital room phone) and explained the reason for the discrepancy, which she seems to understand.  I also spoke with Dr. Tawanna Solo, who will explain reasons why the patient might want to stay, but is potentially open to her going home on antibiotics, taking into account the patient's strong preference to go home.  Cleotis Nipper, M.D. Pager [phone] If no answer or after 5 PM call [phone]

## 2020-11-04 NOTE — Progress Notes (Signed)
PT Cancellation Note  Patient Details Name: [name] MRN: [id number] DOB: 1951/05/18   Cancelled Treatment:    Reason Eval/Treat Not Completed: Patient at procedure or test/unavailable.  Pt has just received EGD and will retry as time and pt allow.   Ramond Dial 11/04/2020, 11:02 AM   Mee Hives, PT MS Acute Rehab Dept. Number: [place] and [place]

## 2020-11-04 NOTE — Interval H&P Note (Signed)
History and Physical Interval Note:  11/04/2020 8:35 AM  [name]  has presented today for surgery, with the diagnosis of Iron deficiency anemia due to GI blood loss.  The various methods of treatment have been discussed with the patient and family. After consideration of risks, benefits and other options for treatment, the patient has consented to  Procedure(s): ESOPHAGOGASTRODUODENOSCOPY (EGD) WITH PROPOFOL (N/A) COLONOSCOPY WITH PROPOFOL (N/A) GIVENS CAPSULE STUDY (N/A) as a surgical intervention.  The patient's history has been reviewed, patient examined, no change in status, stable for surgery.  I have reviewed the patient's chart and labs.  Questions were answered to the patient's satisfaction.     [name]

## 2020-11-04 NOTE — Op Note (Signed)
[facility] Patient Name: [name] Procedure Date : 11/04/2020 MRN: [id number] Attending MD: Ronald Lobo , MD Date of Birth: Jan 09, 1951 CSN: [id number] Age: 70 Admit Type: Inpatient Procedure:                Upper GI endoscopy Indications:              Iron deficiency anemia secondary to chronic blood                            loss, Heme positive stool Providers:                Ronald Lobo, MD, Josie Dixon, RN, Tyna Jaksch Technician Referring MD:              Medicines:                Monitored Anesthesia Care Complications:            No immediate complications. Estimated Blood Loss:     Estimated blood loss: none. Procedure:                Pre-Anesthesia Assessment:                           - Prior to the procedure, a History and Physical                            was performed, and patient medications and                            allergies were reviewed. The patient's tolerance of                            previous anesthesia was also reviewed. The risks                            and benefits of the procedure and the sedation                            options and risks were discussed with the patient.                            All questions were answered, and informed consent                            was obtained. Prior Anticoagulants: The patient has                            taken Xarelto (rivaroxaban), last dose was 2 days                            prior to procedure. ASA Grade Assessment: III - A  patient with severe systemic disease. After                            reviewing the risks and benefits, the patient was                            deemed in satisfactory condition to undergo the                            procedure.                           After obtaining informed consent, the endoscope was                            passed under direct vision. Throughout the                             procedure, the patient's blood pressure, pulse, and                            oxygen saturations were monitored continuously. The                            [id number]-H190 ([id number]) Olympus gastroscope was                            introduced through the mouth, and advanced to the                            second part of duodenum. The upper GI endoscopy was                            accomplished without difficulty. The patient                            tolerated the procedure well. Scope In: Scope Out: Findings:      The larynx was normal.      The examined esophagus was normal.      There is no endoscopic evidence of esophagitis, hiatal hernia or       stenosis in the entire esophagus.      The entire examined stomach was normal.      The cardia and gastric fundus were normal on retroflexion.      There is no endoscopic evidence of bleeding, erythema, erythema or       inflammatory changes suggestive of gastritis, inflammation, mucosal       abnormalities, ulceration, angiodysplasia or erosion in the entire       examined stomach.      The examined duodenum was normal.      There is no endoscopic evidence of bleeding, inflammation, mucosal       abnormalities, ulceration, angiodysplasia or erosion in the duodenal       bulb and in the second portion of the duodenum.      Using the endoscope, after the above inspection, the video capsule  enteroscope was advanced into the second portion of the duodenum using       the through-the-scope capsule deployment device. Impression:               - No source of anemia evident on this study.                           - Normal larynx.                           - Normal esophagus.                           - Normal stomach.                           - Normal examined duodenum.                           - Successful completion of the Video Capsule                            Enteroscope placement.                           -  No specimens collected. Recommendation:           - Perform a colonoscopy today. Procedure Code(s):        --- Professional ---                           [phone], Esophagogastroduodenoscopy, flexible,                            transoral; diagnostic, including collection of                            specimen(s) by brushing or washing, when performed                            (separate procedure) Diagnosis Code(s):        --- Professional ---                           D50.0, Iron deficiency anemia secondary to blood                            loss (chronic)                           R19.5, Other fecal abnormalities CPT copyright 2019 American Medical Association. All rights reserved. The codes documented in this report are preliminary and upon coder review may  be revised to meet current compliance requirements. Ronald Lobo, MD 11/04/2020 9:44:42 AM This report has been signed electronically. Number of Addenda: 0

## 2020-11-04 NOTE — Progress Notes (Signed)
[name] to be D/C'd  per MD order. Discussed with the patient and all questions fully answered.  VSS, Skin clean, dry and intact without evidence of skin break down, no evidence of skin tears noted.  IV catheter discontinued intact. Site without signs and symptoms of complications. Dressing and pressure applied.  An After Visit Summary was printed and given to the patient.  D/c education completed with patient/family including follow up instructions, medication list, d/c activities limitations if indicated, with other d/c instructions as indicated by MD - patient able to verbalize understanding, all questions fully answered.   Patient instructed to return to ED, call 911, or call MD for any changes in condition.   Patient to be escorted via [place], and D/C home via private auto.

## 2020-11-04 NOTE — Progress Notes (Addendum)
PROGRESS NOTE    [name]  MGQ:[id number] DOB: Nov 10, 1950 DOA: 11/03/2020 PCP: Hoyt Koch, MD   Chief Complain: Anemia  Brief Narrative: Patient is a 70 year old female with history proximal A. fib on Xarelto, congestive heart failure with ejection fraction of 50-55%, coronary artery disease, COPD, chronic anemia, diabetes type 2 who presented with complaints of low blood count.  Over the last 2 months, she was having intermittent diarrhea, constipation, generalized abdominal pain.  She was also complaining of progressive shortness of breath on exertion.  She also got a infusion of iron last week.  She also complained of intermittent bright red blood per rectum, leg weakness.  She was taking Xarelto and Celebrex at home.  On presentation she was hemodynamically stable.  Hemoglobin was 7.  FOBT was positive.  Creatinine was 1.37.  Patient was transfused 2 units of PRBC.  Admitted for further evaluation.  She underwent evaluation by GI , underwent EGD/colonoscopy, capsule placement.  Urinalysis was positive UTI and urine cultures showing significant gram-negative rods.  Started on ceftriaxone.  Assessment & Plan:   Principal Problem:   Symptomatic anemia Active Problems:   Type 2 diabetes mellitus with vascular disease (HCC)   Essential hypertension   CAD S/P percutaneous coronary angioplasty   Obesity (BMI 30.0-34.9)   COPD mixed type (HCC)   Permanent atrial fibrillation (HCC)   CHF (congestive heart failure) (HCC)   GI bleed   Chronic respiratory failure with hypoxia (HCC)   Suspected GI bleed/microcytic anemia: Presented with weakness, fatigue, intermittent bright red blood per rectum.  Hemoglobin was 7 on admission.  FOBT positive.  She was on Xarelto and Celebrex at home. She underwent EGD and colonoscopy today without finding of any significant bleeding source.  Video Capsule placed. GI will follow up her as an outpatient.  GI recommended to start Xarelto Sunday  night.  She was transfused with 2 weeks units of PRBC after admission.  Hemoglobin in the range of 8 today.  Will check CBC tomorrow.  Urinary tract infection: Patient endorses dysuria.  Urine cultures showing significant gram-negative rods.  Started on ceftriaxone.  We will follow up culture and sensitivity.  She does not have leukocytosis or fever.  Permanent A. fib: On chronic anticoagulation with Xarelto.  Currently rate is controlled, on diltiazem, beta blocker  CKD stage IIIb: Baseline around 1.26.  Currently kidney function at baseline.  Hyponatremia/hypomagnesemia: Magnesium supplemented.  Electrolytes will be monitored.  Hypertension: Currently blood pressure stable, continue home medications  Diabetes type 2: Last hemoglobin A1c of 7.3 as per 09/23/2020.  Continue sliding scale insulin.  Monitor blood sugars.  Diastolic congestive heart failure: Currently euvolemic.  Chest x-ray did not show pulmonary edema.  EF of 50 to 55% as per echo done on 4/21.  History of coronary artery disease: Status post PCI.  COPD: Currently not in exacerbation.  Anxiety: On paroxetine, Ativan as needed  Morbid obesity: BMI of 35.9.  GERD: On Pepcid twice a day at home.         DVT prophylaxis:SCD Code Status: Full Family Communication: Spouse on phone Status is: Observation  The patient remains OBS appropriate and will d/c before 2 midnights.  Dispo: The patient is from: Home              Anticipated d/c is to: Home              Patient currently is not medically stable to d/c.   Difficult to place patient No  Consultants: GI  Procedures: Endoscopy, colonoscopy  Antimicrobials:  Anti-infectives (From admission, onward)   Start     Dose/Rate Route Frequency Ordered Stop   11/04/20 1115  cefTRIAXone (ROCEPHIN) 1 g in sodium chloride 0.9 % 100 mL IVPB        1 g 200 mL/hr over 30 Minutes Intravenous Every 24 hours 11/04/20 1028        Subjective: Patient seen and  examined the bedside this morning after endoscopy.  She was comfortable, hemodynamically stable and says she feels much better today after she was given blood last night.  She has dysuria.  Reports history of UTI in the past.  Objective: Vitals:   11/04/20 0803 11/04/20 0942 11/04/20 0953 11/04/20 1002  BP: (!) 153/52 (!) 131/37 (!) 138/53 (!) 148/41  Pulse: 93 83 79 83  Resp: (!) 23 (!) 8 16 13   Temp: 97.7 F (36.5 C) 97.8 F (36.6 C)    TempSrc: Temporal Oral    SpO2: 97% 93% 91% 93%  Weight: 86.2 kg     Height: 5\' 2"  (1.575 m)       Intake/Output Summary (Last 24 hours) at 11/04/2020 1143 Last data filed at 11/04/2020 1002 Gross per 24 hour  Intake 1205 ml  Output 0 ml  Net 1205 ml   Filed Weights   11/03/20 1400 11/04/20 0803  Weight: 86.2 kg 86.2 kg    Examination:  General exam: Obese, overall comfortable Respiratory system: Bilateral equal air entry, normal vesicular breath sounds, no wheezes or crackles  Cardiovascular system: S1 & S2 heard, RRR. No JVD, murmurs, rubs, gallops or clicks. No pedal edema. Gastrointestinal system: Abdomen is nondistended, soft and nontender. No organomegaly or masses felt. Normal bowel sounds heard. Central nervous system: Alert and oriented. No focal neurological deficits. Extremities: No edema, no clubbing ,no cyanosis Skin: No rashes, lesions or ulcers,no icterus ,no pallor   Data Reviewed: I have personally reviewed following labs and imaging studies  CBC: Recent Labs  Lab 11/03/20 0914 11/03/20 2027 11/04/20 0055  WBC 6.6  --  6.1  HGB 7.0* 8.5* 8.1*  HCT 24.0* 28.0* 25.2*  MCV 81.6  --  78.8*  PLT 253  --  166   Basic Metabolic Panel: Recent Labs  Lab 11/03/20 0914 11/04/20 0055  NA 130* 130*  K 4.0 4.0  CL 95* 94*  CO2 25 26  GLUCOSE 137* 118*  BUN 15 14  CREATININE 1.36* 1.14*  CALCIUM 9.1 9.0  MG  --  1.5*   GFR: Estimated Creatinine Clearance: 45.4 mL/min (A) (by C-G formula based on SCr of 1.14 mg/dL  (H)). Liver Function Tests: Recent Labs  Lab 11/03/20 0914  AST 19  ALT 12  ALKPHOS 62  BILITOT 0.6  PROT 7.3  ALBUMIN 3.7   No results for input(s): LIPASE, AMYLASE in the last 168 hours. No results for input(s): AMMONIA in the last 168 hours. Coagulation Profile: Recent Labs  Lab 11/03/20 0957 11/04/20 0055  INR 1.4* 1.3*   Cardiac Enzymes: No results for input(s): CKTOTAL, CKMB, CKMBINDEX, TROPONINI in the last 168 hours. BNP (last 3 results) Recent Labs    01/08/20 1616 09/23/20 1601  PROBNP 220.0* 224.0*   HbA1C: No results for input(s): HGBA1C in the last 72 hours. CBG: Recent Labs  Lab 11/03/20 1237 11/03/20 1734 11/03/20 2108 11/04/20 0736 11/04/20 1137  GLUCAP 133* 104* 141* 119* 124*   Lipid Profile: No results for input(s): CHOL, HDL, LDLCALC, TRIG, CHOLHDL, LDLDIRECT in the  last 72 hours. Thyroid Function Tests: No results for input(s): TSH, T4TOTAL, FREET4, T3FREE, THYROIDAB in the last 72 hours. Anemia Panel: No results for input(s): VITAMINB12, FOLATE, FERRITIN, TIBC, IRON, RETICCTPCT in the last 72 hours. Sepsis Labs: No results for input(s): PROCALCITON, LATICACIDVEN in the last 168 hours.  Recent Results (from the past 240 hour(s))  Resp Panel by RT-PCR (Flu A&B, Covid) Nasopharyngeal Swab     Status: None   Collection Time: 11/03/20 10:27 AM   Specimen: Nasopharyngeal Swab; Nasopharyngeal(NP) swabs in vial transport medium  Result Value Ref Range Status   SARS Coronavirus 2 by RT PCR NEGATIVE NEGATIVE Final    Comment: (NOTE) SARS-CoV-2 target nucleic acids are NOT DETECTED.  The SARS-CoV-2 RNA is generally detectable in upper respiratory specimens during the acute phase of infection. The lowest concentration of SARS-CoV-2 viral copies this assay can detect is 138 copies/mL. A negative result does not preclude SARS-Cov-2 infection and should not be used as the sole basis for treatment or other patient management decisions. A negative  result may occur with  improper specimen collection/handling, submission of specimen other than nasopharyngeal swab, presence of viral mutation(s) within the areas targeted by this assay, and inadequate number of viral copies(<138 copies/mL). A negative result must be combined with clinical observations, patient history, and epidemiological information. The expected result is Negative.  Fact Sheet for Patients:  EntrepreneurPulse.com.au  Fact Sheet for Healthcare Providers:  IncredibleEmployment.be  This test is no t yet approved or cleared by the [place] FDA and  has been authorized for detection and/or diagnosis of SARS-CoV-2 by FDA under an Emergency Use Authorization (EUA). This EUA will remain  in effect (meaning this test can be used) for the duration of the COVID-19 declaration under Section 564(b)(1) of the Act, 21 U.S.C.section 360bbb-3(b)(1), unless the authorization is terminated  or revoked sooner.       Influenza A by PCR NEGATIVE NEGATIVE Final   Influenza B by PCR NEGATIVE NEGATIVE Final    Comment: (NOTE) The Xpert Xpress SARS-CoV-2/FLU/RSV plus assay is intended as an aid in the diagnosis of influenza from Nasopharyngeal swab specimens and should not be used as a sole basis for treatment. Nasal washings and aspirates are unacceptable for Xpert Xpress SARS-CoV-2/FLU/RSV testing.  Fact Sheet for Patients: EntrepreneurPulse.com.au  Fact Sheet for Healthcare Providers: IncredibleEmployment.be  This test is not yet approved or cleared by the [place] FDA and has been authorized for detection and/or diagnosis of SARS-CoV-2 by FDA under an Emergency Use Authorization (EUA). This EUA will remain in effect (meaning this test can be used) for the duration of the COVID-19 declaration under Section 564(b)(1) of the Act, 21 U.S.C. section 360bbb-3(b)(1), unless the authorization is  terminated or revoked.  Performed at [facility], [place] [address], [place], [place] [address]   Urine culture     Status: Abnormal (Preliminary result)   Collection Time: 11/03/20 11:55 AM   Specimen: Urine, Random  Result Value Ref Range Status   Specimen Description URINE, RANDOM  Final   Special Requests NONE  Final   Culture (A)  Final    >=100,000 COLONIES/mL GRAM NEGATIVE RODS SUSCEPTIBILITIES TO FOLLOW Performed at [facility], [place] [address], [place], [place] [address]    Report Status PENDING  Incomplete         Radiology Studies: DG Chest Port 1 View  Result Date: 11/03/2020 CLINICAL DATA:  Low hemoglobin and shortness of breath EXAM: PORTABLE CHEST 1 VIEW COMPARISON:  09/30/2020 FINDINGS: Mild scarring at the lingula, also seen on 2020 chest CT. There is no edema, consolidation, effusion, or pneumothorax. Stable borderline heart size for technique. Atherosclerotic calcification of the aorta. Artifact from EKG leads. IMPRESSION: No evidence of active disease. Electronically Signed   By: Monte Fantasia M.D.   On: 11/03/2020 10:29        Scheduled Meds: . diltiazem  180 mg Oral Daily  . famotidine  20 mg Oral BID  . gabapentin  100 mg Oral BID  . Gerhardt's butt cream  1 application Topical TID  . HYDROcodone-acetaminophen  0.5 tablet Oral QHS  . insulin aspart  0-9 Units Subcutaneous TID WC  . latanoprost  1 drop Both Eyes QHS  . metoprolol succinate  100 mg Oral BID  . pantoprazole  40 mg Oral Daily  . PARoxetine  25 mg Oral Daily  . potassium chloride  10 mEq Oral BID  . quinapril  40 mg Oral QHS  . rOPINIRole  0.5 mg Oral QHS  . rosuvastatin  10 mg Oral QPM  . sodium chloride flush  3 mL Intravenous Q12H  . torsemide  40 mg Oral BID  . traZODone  100 mg Oral QHS  . umeclidinium-vilanterol  1 puff Inhalation Daily   Continuous Infusions: . sodium chloride    . cefTRIAXone (ROCEPHIN)  IV    . lactated ringers 10 mL/hr at 11/04/20 0808   . magnesium sulfate bolus IVPB       LOS: 0 days    Time spent: More than 50% of that time was spent in counseling and/or coordination of care.      Shelly Coss, MD [facility] P3/25/2022, 11:43 AM

## 2020-11-04 NOTE — Anesthesia Preprocedure Evaluation (Signed)
Anesthesia Evaluation  Patient identified by MRN, date of birth, ID band Patient awake    Reviewed: Allergy & Precautions, NPO status , Patient's Chart, lab work & pertinent test results  Airway Mallampati: II  TM Distance: >3 FB Neck ROM: Full    Dental  (+) Poor Dentition Multiple decayed and broken teeth especially on the bottom; none particularly loose:   Pulmonary shortness of breath, asthma , COPD (does not use O2 or CPAP/BiPaP; patient states has had conflicting sleep studies in the past), former smoker,    Pulmonary exam normal breath sounds clear to auscultation       Cardiovascular hypertension, + CAD and +CHF  + dysrhythmias (on Xarelto) Atrial Fibrillation  Rhythm:Irregular Rate:Normal     Neuro/Psych  Headaches, PSYCHIATRIC DISORDERS Anxiety Depression  Neuromuscular disease (Bell's palsy; diabetic neuropathy)    GI/Hepatic GERD  ,  Endo/Other  diabetes  Renal/GU      Musculoskeletal  (+) Arthritis , Osteoarthritis,    Abdominal   Peds  Hematology  (+) anemia , H/h 8.1/25   Anesthesia Other Findings   Reproductive/Obstetrics                             Anesthesia Physical Anesthesia Plan  ASA: III  Anesthesia Plan: MAC   Post-op Pain Management:    Induction:   PONV Risk Score and Plan: 2 and Propofol infusion, TIVA and Treatment may vary due to age or medical condition  Airway Management Planned: Natural Airway  Additional Equipment: None  Intra-op Plan:   Post-operative Plan:   Informed Consent: I have reviewed the patients History and Physical, chart, labs and discussed the procedure including the risks, benefits and alternatives for the proposed anesthesia with the patient or authorized representative who has indicated his/her understanding and acceptance.     Dental advisory given  Plan Discussed with: CRNA, Anesthesiologist and Surgeon  Anesthesia Plan  Comments: (Discussed that her poor dentition puts her at high risk to have a tooth dislodged or broken due to the scope used by the endoscopy team. Norton Blizzard, MD  )        Anesthesia Quick Evaluation

## 2020-11-04 NOTE — Progress Notes (Addendum)
Patient's procedures were well-tolerated.  Upper endoscopy normal.  Capsule endoscopy successfully deployed.  Colonoscopy showed 2 small cecal AVMs, 1 of which started oozing actively when touched with the tip of the scope.  I suspect this may be where the patient has been bleeding from it.  Both AVMs were ablated by treatment with argon plasma coagulation therapy.  She also had 3 tiny polyps which I cold-snared off.  There was no blood in the GI tract, no evidence of any active or ongoing GI bleeding at the time of the procedures.  Recommendations:  1.  Okay for discharge today.  The patient has the option of leaving this afternoon, and dropping off her video capsule monitoring equipment at the admitting desk tomorrow morning; or, she can remain in the hospital until the monitoring is complete, approximately 9 PM tonight, in which case the nurses can remove the equipment and the endoscopy staff will pick it up from the floor tomorrow.  2.  Okay to resume Xarelto Sunday night.  3.  Diet instruction per endoscopy nursing staff.  By tomorrow morning, the patient can have an unrestricted diet.  4.  I will be in touch with the patient regarding her capsule endoscopy findings, as well as the pathology results on her polyps.  I have made an office follow-up appointment with our physician assistant, Deliah Goody, for April 4 at 3:45 PM.  At that time, she can do blood testing to monitor hemoglobin level, and clinical monitoring to include hemoccult checks.  5.  I have carefully instructed the patient, and more especially her wife, Edd Fabian (14-minute phone conversation) to contact us or to go to the ER if she develops bleeding, symptoms of anemia, or abdominal pain or fever especially in the next several days.  Cleotis Nipper, M.D. Pager [phone] If no answer or after 5 PM call [phone]

## 2020-11-04 NOTE — Progress Notes (Signed)
PT Cancellation Note  Patient Details Name: [name] MRN: [id number] DOB: 12-22-1950   Cancelled Treatment:    Reason Eval/Treat Not Completed: Other (comment).  Nursing reports this is not a good time to see pt, will retry at another time.   Ramond Dial 11/04/2020, 2:16 PM Mee Hives, PT MS Acute Rehab Dept. Number: [place] and [place]

## 2020-11-04 NOTE — Op Note (Signed)
[facility] Patient Name: [name] Procedure Date : 11/04/2020 MRN: [id number] Attending MD: Ronald Lobo , MD Date of Birth: 27-Mar-1951 CSN: [id number] Age: 70 Admit Type: Inpatient Procedure:                Colonoscopy Indications:              Last colonoscopy: June 2019, Heme positive stool,                            Iron deficiency anemia secondary to chronic blood                            loss Providers:                Ronald Lobo, MD, Josie Dixon, RN, Tyna Jaksch Technician Referring MD:              Medicines:                Monitored Anesthesia Care Complications:            No immediate complications. Estimated Blood Loss:     Estimated blood loss: none. Procedure:                Pre-Anesthesia Assessment:                           - Prior to the procedure, a History and Physical                            was performed, and patient medications and                            allergies were reviewed. The patient's tolerance of                            previous anesthesia was also reviewed. The risks                            and benefits of the procedure and the sedation                            options and risks were discussed with the patient.                            All questions were answered, and informed consent                            was obtained. Prior Anticoagulants: The patient has                            taken Xarelto (rivaroxaban), last dose was 2 days                            prior to procedure.  ASA Grade Assessment: III - A                            patient with severe systemic disease. After                            reviewing the risks and benefits, the patient was                            deemed in satisfactory condition to undergo the                            procedure.                           - Prior to the procedure, a History and Physical                            was  performed, and patient medications and                            allergies were reviewed. The patient's tolerance of                            previous anesthesia was also reviewed. The risks                            and benefits of the procedure and the sedation                            options and risks were discussed with the patient.                            All questions were answered, and informed consent                            was obtained. Prior Anticoagulants: The patient has                            taken Xarelto (rivaroxaban), last dose was 2 days                            prior to procedure. ASA Grade Assessment: III - A                            patient with severe systemic disease. After                            reviewing the risks and benefits, the patient was                            deemed in satisfactory condition to undergo the  procedure.                           After obtaining informed consent, the colonoscope                            was passed under direct vision. Throughout the                            procedure, the patient's blood pressure, pulse, and                            oxygen saturations were monitored continuously. The                            CF-HQ190L ([id number] Olympus colonoscope was                            introduced through the anus and advanced to the the                            cecum, identified by appendiceal orifice and                            ileocecal valve. The colonoscopy was performed with                            ease. The patient tolerated the procedure well. The                            quality of the bowel preparation was excellent. Scope In: 9:02:51 AM Scope Out: 9:33:33 AM Scope Withdrawal Time: 0 hours 26 minutes 24 seconds  Total Procedure Duration: 0 hours 30 minutes 42 seconds  Findings:      The perianal and digital rectal examinations were normal.      Two  small angiodysplastic lesions one with transient oozing on contact       but with spontaneous hemostasis, were found in the cecum. Fulguration to       ablate the lesions to prevent bleeding by argon plasma at 0.5       liters/minute and 20 watts (right colon setting) was successful.       Estimated blood loss from intervention: none; spontaneous oozing: < 1 mL.      Two sessile polyps were found in the distal transverse colon. The polyps       were diminutive in size. These polyps were removed with a cold snare.       Resection and retrieval were complete. Estimated blood loss: none.      A 4 mm polyp was found in the mid rectum. The polyp was sessile. The       polyp was removed with a cold snare. Resection and retrieval were       complete. Estimated blood loss was minimal.      No other significant abnormalities were identified in a careful       examination of the remainder of the colon. The proximal colon was       re-examined without  additional findings. No diverticulosis or colitis.      The retroflexed view of the distal rectum and anal verge was normal and       showed no anal or rectal abnormalities. Reinspection of the rectum       showed no additional findings. Impression:               - Two colonic angiodysplastic lesions. One bled                            with contact from the scope, suggesting that it                            might account for patient's intermittent blood loss                            anemia, especially while anticoagulated. Treated                            with argon plasma coagulation (APC).                           - Two diminutive polyps in the distal transverse                            colon, removed with a cold snare. Resected and                            retrieved.                           - One 4 mm polyp in the mid rectum, removed with a                            cold snare. Resected and retrieved.                           - The  distal rectum and anal verge are normal on                            retroflexion view. Recommendation:           - Await pathology results.                           - Resume Xarelto (rivaroxaban) at prior dose in 2                            days.                           - Follow clinically; ok for discharge later                            tomorrow.                           -  Repeat colonoscopy in 3 - 5 years for                            surveillance based on pathology results. Procedure Code(s):        --- Professional ---                           [id number], 59, Colonoscopy, flexible; with control of                            bleeding, any method                           45385, Colonoscopy, flexible; with removal of                            tumor(s), polyp(s), or other lesion(s) by snare                            technique Diagnosis Code(s):        --- Professional ---                           K55.20, Angiodysplasia of colon without hemorrhage                           K63.5, Polyp of colon                           K62.1, Rectal polyp                           R19.5, Other fecal abnormalities                           D50.0, Iron deficiency anemia secondary to blood                            loss (chronic) CPT copyright 2019 [employer]. All rights reserved. The codes documented in this report are preliminary and upon coder review may  be revised to meet current compliance requirements. Ronald Lobo, MD 11/04/2020 10:34:25 AM This report has been signed electronically. Number of Addenda: 0

## 2020-11-04 NOTE — Anesthesia Procedure Notes (Signed)
Procedure Name: MAC Date/Time: 11/04/2020 8:37 AM Performed by: Inda Coke, CRNA Pre-anesthesia Checklist: Patient identified, Emergency Drugs available, Suction available, Timeout performed and Patient being monitored Patient Re-evaluated:Patient Re-evaluated prior to induction Oxygen Delivery Method: Nasal cannula Induction Type: IV induction Dental Injury: Teeth and Oropharynx as per pre-operative assessment

## 2020-11-04 NOTE — Discharge Summary (Addendum)
Physician Discharge Summary  [name] QHU:[id number] DOB: 06/21/1951 DOA: 11/03/2020  PCP: Hoyt Koch, MD  Admit date: 11/03/2020 Discharge date: 11/04/2020  Admitted From: Home Disposition:  Home  Discharge Condition:Stable CODE STATUS:FULL Diet recommendation: Heart Healthy   Brief/Interim Summary: Patient is a 70 year old female with history proximal A. fib on Xarelto, congestive heart failure with ejection fraction of 50-55%, coronary artery disease, COPD, chronic anemia, diabetes type 2 who presented with complaints of low blood count.  Over the last 2 months, she was having intermittent diarrhea, constipation, generalized abdominal pain.  She was also complaining of progressive shortness of breath on exertion.  She also got a infusion of iron last week.  She also complained of intermittent bright red blood per rectum, leg weakness.  She was taking Xarelto and Celebrex at home.  On presentation she was hemodynamically stable.  Hemoglobin was 7.  FOBT was positive.  Creatinine was 1.37.  Patient was transfused 2 units of PRBC.  Admitted for further evaluation.  She underwent evaluation by GI , underwent EGD/colonoscopy, capsule placement.  Urinalysis was positive UTI and urine cultures showing significant gram-negative rods.  Started on ceftriaxone.  Patient desperately wants to go home today, not willing to stay 1 more night until we can follow-up the urine culture.  She will be discharged today with oral antibiotics.  Video capsule studies will be followed by Dr. Cristina Gong  Following problems were addressed during hospitalization:   Suspected GI bleed/microcytic anemia: Presented with weakness, fatigue, intermittent bright red blood per rectum.  Hemoglobin was 7 on admission.  FOBT positive.  She was on Xarelto and Celebrex at home. She underwent EGD and colonoscopy today without finding of any significant bleeding source.  Video Capsule placed. GI will follow up her as an  outpatient.  GI recommended to start Xarelto Sunday night.  She was transfused with 2 weeks units of PRBC after admission.  Hemoglobin in the range of 8 today.  Check CBC in a week.  No active bleeding.  Urinary tract infection: Patient endorses dysuria.  Urine cultures showing significant gram-negative rods.  Started on ceftriaxone.  She does not have leukocytosis or fever.  Patient did not want to stay 1 more night so she was intended to be discharged on  Keflex,but she said she has develops nausea with that. After reviewing her previous urine culture report,she was discharged on augmentin. She had history of urinary tract infection in the past and sensitivities did not show resistant  organism.  Permanent A. fib: On chronic anticoagulation with Xarelto.  Currently rate is controlled, on diltiazem, beta blocker  CKD stage IIIb: Baseline around 1.26.  Currently kidney function at baseline.  Hyponatremia/hypomagnesemia: Magnesium supplemented. Check BMP in a week  Hypertension: Currently blood pressure stable, continue home medications  Diabetes type 2: Last hemoglobin A1c of 7.3 as per 09/23/2020.  Continue sliding scale insulin.  Monitor blood sugars.  Diastolic congestive heart failure: Currently euvolemic.  Chest x-ray did not show pulmonary edema.  EF of 50 to 55% as per echo done on 4/21.  History of coronary artery disease: Status post PCI.  COPD: Currently not in exacerbation.  Anxiety: On paroxetine, Ativan as needed  Morbid obesity: BMI of 35.9.  GERD: On Pepcid twice a day at home.   Discharge Diagnoses:  Principal Problem:   Symptomatic anemia Active Problems:   Type 2 diabetes mellitus with vascular disease ([place])   Essential hypertension   CAD S/P percutaneous coronary angioplasty   Obesity (  BMI 30.0-34.9)   COPD mixed type (HCC)   Permanent atrial fibrillation (HCC)   CHF (congestive heart failure) (HCC)   GI bleed   Chronic respiratory failure with  hypoxia [facility])    Discharge Instructions  Discharge Instructions    Diet - low sodium heart healthy   Complete by: As directed    Discharge instructions   Complete by: As directed    1)Please take prescribed medications as instructed 2)Follow up with your primary care physician in a week.  Do CBC, BMP tests during the follow-up 3)Follow up up with Dr. Cristina Gong as an outpatient   Increase activity slowly   Complete by: As directed      Allergies as of 11/04/2020      Reactions   Benadryl [diphenhydramine Hcl] Other (See Comments)   Restless leg   Clopidogrel Bisulfate Nausea Only   Nausea & pain   Contrast Media [iodinated Diagnostic Agents] Palpitations   Rapid heart rate, hot      Medication List    TAKE these medications   amoxicillin-clavulanate 500-125 MG tablet Commonly known as: Augmentin Take 1 tablet (500 mg total) by mouth 2 (two) times daily for 4 days. Start taking on: November 05, 2020   Anoro Ellipta 62.5-25 MCG/INH Aepb Generic drug: umeclidinium-vilanterol Inhale 1 puff into the lungs daily.   ascorbic acid 1000 MG tablet Commonly known as: VITAMIN C Take 1,000 mg by mouth daily.   benzonatate 200 MG capsule Commonly known as: TESSALON Take 200 mg by mouth as needed for cough.   budesonide-formoterol 160-4.5 MCG/ACT inhaler Commonly known as: SYMBICORT Inhale 2 puffs into the lungs daily.   CALCIUM CITRATE PLUS/MAGNESIUM PO Take 1 tablet by mouth daily.   CALCIUM-MAG-VIT C-VIT D PO Take 1 tablet by mouth daily.   canagliflozin 100 MG Tabs tablet Commonly known as: Invokana Take 1 tablet (100 mg total) by mouth daily before breakfast.   celecoxib 200 MG capsule Commonly known as: CELEBREX TAKE (1) CAPSULE TWICE DAILY.   colestipol 1 g tablet Commonly known as: COLESTID Take 1 g by mouth as needed (stomach).   diltiazem 180 MG 24 hr capsule Commonly known as: CARDIZEM CD TAKE 1 CAPSULE DAILY.   famotidine 20 MG tablet Commonly known  as: PEPCID Take 20 mg by mouth 2 (two) times daily.   FreeStyle Libre 14 Day Sensor Misc FreeStyle Libre 14 Day Sensor kit   gabapentin 100 MG capsule Commonly known as: NEURONTIN TAKE (1) CAPSULE THREE TIMES DAILY. What changed: See the new instructions.   guaiFENesin 600 MG 12 hr tablet Commonly known as: MUCINEX Take 600 mg by mouth 2 (two) times daily as needed for cough.   HYDROcodone-acetaminophen 5-325 MG tablet Commonly known as: NORCO/VICODIN Take 1 tablet by mouth at bedtime. What changed: how much to take   Janumet 50-1000 MG tablet Generic drug: sitaGLIPtin-metformin Take 1 tablet by mouth 2 (two) times daily with a meal. TAKE 1/2 TABLET IN THE MORNING WITH BREAKFAST AND 1 TABLET IN THE EVENING WITH SUPPER. What changed:   how much to take  when to take this   latanoprost 0.005 % ophthalmic solution Commonly known as: XALATAN Place 1 drop into both eyes at bedtime.   LORazepam 0.5 MG tablet Commonly known as: ATIVAN TAKE 1 OR 2 TABLETS AT BEDTIME. What changed: See the new instructions.   Magnesium Oxide 400 MG Caps Take 1 capsule (400 mg total) by mouth daily.   metoprolol succinate 100 MG 24 hr tablet Commonly  known as: TOPROL-XL TAKE ONE TABLET TWICE A DAY WITH OR IMMEDIATELY FOLLOWING A MEAL. What changed: See the new instructions.   multivitamins with iron Tabs tablet Take 1 tablet by mouth daily.   nystatin ointment Commonly known as: MYCOSTATIN Apply 1 application topically 2 (two) times daily as needed (IRRITATION).   omeprazole 20 MG capsule Commonly known as: PRILOSEC Take 1 capsule (20 mg total) by mouth 2 (two) times daily before a meal.   OneTouch Delica Lancets Fine Misc 1 Units by Does not apply route as directed.   PARoxetine 10 MG tablet Commonly known as: PAXIL TAKE 2 AND 1/2 TABS DAILY What changed: See the new instructions.   potassium chloride 10 MEQ CR capsule Commonly known as: MICRO-K TAKE (2) CAPSULES DAILY. What  changed: See the new instructions.   quinapril 40 MG tablet Commonly known as: ACCUPRIL TAKE 1 TABLET ONCE DAILY. What changed: when to take this   rivaroxaban 20 MG Tabs tablet Commonly known as: Xarelto Take 1 tablet (20 mg total) by mouth daily with supper. Restart only on Sunday Start taking on: November 06, 2020 What changed:   See the new instructions.  These instructions start on November 06, 2020. If you are unsure what to do until then, ask your doctor or other care provider.   rOPINIRole 0.5 MG tablet Commonly known as: REQUIP TAKE 1 TO 2 TABLETS AT MIDNIGHT. What changed: See the new instructions.   rosuvastatin 10 MG tablet Commonly known as: CRESTOR Take 1 tablet (10 mg total) by mouth daily. What changed: when to take this   senna 8.6 MG Tabs tablet Commonly known as: SENOKOT Take 1 tablet by mouth at bedtime as needed for mild constipation.   torsemide 20 MG tablet Commonly known as: DEMADEX TAKE (2) TABLETS TWICE DAILY. What changed: See the new instructions.   traZODone 100 MG tablet Commonly known as: DESYREL TAKE 1/2 TO 1 TABLET AT BEDTIME. What changed: See the new instructions.       Follow-up Information    Hoyt Koch, MD. Schedule an appointment as soon as possible for a visit in 1 week(s).   Specialty: Internal Medicine Contact information: [place] [address] [phone]              Allergies  Allergen Reactions  . Benadryl [Diphenhydramine Hcl] Other (See Comments)    Restless leg  . Clopidogrel Bisulfate Nausea Only    Nausea & pain  . Contrast Media [Iodinated Diagnostic Agents] Palpitations    Rapid heart rate, hot    Consultations:  GI   Procedures/Studies: DG Chest Port 1 View  Result Date: 11/03/2020 CLINICAL DATA:  Low hemoglobin and shortness of breath EXAM: PORTABLE CHEST 1 VIEW COMPARISON:  09/30/2020 FINDINGS: Mild scarring at the lingula, also seen on 2020 chest CT. There is no  edema, consolidation, effusion, or pneumothorax. Stable borderline heart size for technique. Atherosclerotic calcification of the aorta. Artifact from EKG leads. IMPRESSION: No evidence of active disease. Electronically Signed   By: Monte Fantasia M.D.   On: 11/03/2020 10:29      Subjective: Patient seen and examined at the bedside this morning.  All questions answered.  Discharge Exam: Vitals:   11/04/20 1157 11/04/20 1623  BP: (!) 132/53 (!) 124/48  Pulse: 67 88  Resp: 15 16  Temp: 97.6 F (36.4 C) 98.1 F (36.7 C)  SpO2: 98% 97%   Vitals:   11/04/20 0953 11/04/20 1002 11/04/20 1157 11/04/20 1623  BP: (!) 138/53 (!) 148/41 (!) 132/53 (!) 124/48  Pulse: 79 83 67 88  Resp: '16 13 15 16  ' Temp:   97.6 F (36.4 C) 98.1 F (36.7 C)  TempSrc:   Oral Oral  SpO2: 91% 93% 98% 97%  Weight:      Height:        General: Pt is alert, awake, not in acute distress Cardiovascular: RRR, S1/S2 +, no rubs, no gallops Respiratory: CTA bilaterally, no wheezing, no rhonchi Abdominal: Soft, NT, ND, bowel sounds + Extremities: no edema, no cyanosis    The results of significant diagnostics from this hospitalization (including imaging, microbiology, ancillary and laboratory) are listed below for reference.     Microbiology: Recent Results (from the past 240 hour(s))  Resp Panel by RT-PCR (Flu A&B, Covid) Nasopharyngeal Swab     Status: None   Collection Time: 11/03/20 10:27 AM   Specimen: Nasopharyngeal Swab; Nasopharyngeal(NP) swabs in vial transport medium  Result Value Ref Range Status   SARS Coronavirus 2 by RT PCR NEGATIVE NEGATIVE Final    Comment: (NOTE) SARS-CoV-2 target nucleic acids are NOT DETECTED.  The SARS-CoV-2 RNA is generally detectable in upper respiratory specimens during the acute phase of infection. The lowest concentration of SARS-CoV-2 viral copies this assay can detect is 138 copies/mL. A negative result does not preclude SARS-Cov-2 infection and should not  be used as the sole basis for treatment or other patient management decisions. A negative result may occur with  improper specimen collection/handling, submission of specimen other than nasopharyngeal swab, presence of viral mutation(s) within the areas targeted by this assay, and inadequate number of viral copies(<138 copies/mL). A negative result must be combined with clinical observations, patient history, and epidemiological information. The expected result is Negative.  Fact Sheet for Patients:  EntrepreneurPulse.com.au  Fact Sheet for Healthcare Providers:  IncredibleEmployment.be  This test is no t yet approved or cleared by the [place] FDA and  has been authorized for detection and/or diagnosis of SARS-CoV-2 by FDA under an Emergency Use Authorization (EUA). This EUA will remain  in effect (meaning this test can be used) for the duration of the COVID-19 declaration under Section 564(b)(1) of the Act, 21 U.S.C.section 360bbb-3(b)(1), unless the authorization is terminated  or revoked sooner.       Influenza A by PCR NEGATIVE NEGATIVE Final   Influenza B by PCR NEGATIVE NEGATIVE Final    Comment: (NOTE) The Xpert Xpress SARS-CoV-2/FLU/RSV plus assay is intended as an aid in the diagnosis of influenza from Nasopharyngeal swab specimens and should not be used as a sole basis for treatment. Nasal washings and aspirates are unacceptable for Xpert Xpress SARS-CoV-2/FLU/RSV testing.  Fact Sheet for Patients: EntrepreneurPulse.com.au  Fact Sheet for Healthcare Providers: IncredibleEmployment.be  This test is not yet approved or cleared by the [place] FDA and has been authorized for detection and/or diagnosis of SARS-CoV-2 by FDA under an Emergency Use Authorization (EUA). This EUA will remain in effect (meaning this test can be used) for the duration of the COVID-19 declaration under Section  564(b)(1) of the Act, 21 U.S.C. section 360bbb-3(b)(1), unless the authorization is terminated or revoked.  Performed at [facility], [place] [address], [place], [place] [address]   Urine culture     Status: Abnormal (Preliminary result)   Collection Time: 11/03/20 11:55 AM   Specimen: Urine, Random  Result Value Ref Range Status   Specimen Description URINE, RANDOM  Final   Special Requests NONE  Final  Culture (A)  Final    >=100,000 COLONIES/mL ESCHERICHIA COLI SUSCEPTIBILITIES TO FOLLOW Performed at [facility] [address], [place], [place] [address]    Report Status PENDING  Incomplete     Labs: BNP (last 3 results) No results for input(s): BNP in the last 8760 hours. Basic Metabolic Panel: Recent Labs  Lab 11/03/20 0914 11/04/20 0055  NA 130* 130*  K 4.0 4.0  CL 95* 94*  CO2 25 26  GLUCOSE 137* 118*  BUN 15 14  CREATININE 1.36* 1.14*  CALCIUM 9.1 9.0  MG  --  1.5*   Liver Function Tests: Recent Labs  Lab 11/03/20 0914  AST 19  ALT 12  ALKPHOS 62  BILITOT 0.6  PROT 7.3  ALBUMIN 3.7   No results for input(s): LIPASE, AMYLASE in the last 168 hours. No results for input(s): AMMONIA in the last 168 hours. CBC: Recent Labs  Lab 11/03/20 0914 11/03/20 2027 11/04/20 0055 11/04/20 1112  WBC 6.6  --  6.1 7.1  HGB 7.0* 8.5* 8.1* 8.3*  HCT 24.0* 28.0* 25.2* 27.8*  MCV 81.6  --  78.8* 81.8  PLT 253  --  245 248   Cardiac Enzymes: No results for input(s): CKTOTAL, CKMB, CKMBINDEX, TROPONINI in the last 168 hours. BNP: Invalid input(s): POCBNP CBG: Recent Labs  Lab 11/03/20 1734 11/03/20 2108 11/04/20 0736 11/04/20 1137 11/04/20 1649  GLUCAP 104* 141* 119* 124* 162*   D-Dimer No results for input(s): DDIMER in the last 72 hours. Hgb A1c No results for input(s): HGBA1C in the last 72 hours. Lipid Profile No results for input(s): CHOL, HDL, LDLCALC, TRIG, CHOLHDL, LDLDIRECT in the last 72 hours. Thyroid function studies No  results for input(s): TSH, T4TOTAL, T3FREE, THYROIDAB in the last 72 hours.  Invalid input(s): FREET3 Anemia work up Recent Labs    11/04/20 1108  FERRITIN 211  TIBC 601*  IRON 113   Urinalysis    Component Value Date/Time   COLORURINE YELLOW 11/03/2020 1027   APPEARANCEUR HAZY (A) 11/03/2020 1027   LABSPEC 1.002 (L) 11/03/2020 1027   PHURINE 5.0 11/03/2020 1027   GLUCOSEU >=500 (A) 11/03/2020 1027   HGBUR SMALL (A) 11/03/2020 1027   HGBUR negative 05/18/2010 1218   BILIRUBINUR NEGATIVE 11/03/2020 1027   BILIRUBINUR Neg 10/02/2018 1202   KETONESUR NEGATIVE 11/03/2020 1027   PROTEINUR NEGATIVE 11/03/2020 1027   UROBILINOGEN 0.2 10/02/2018 1202   UROBILINOGEN 0.2 06/11/2011 1215   NITRITE POSITIVE (A) 11/03/2020 1027   LEUKOCYTESUR LARGE (A) 11/03/2020 1027   Sepsis Labs Invalid input(s): PROCALCITONIN,  WBC,  LACTICIDVEN Microbiology Recent Results (from the past 240 hour(s))  Resp Panel by RT-PCR (Flu A&B, Covid) Nasopharyngeal Swab     Status: None   Collection Time: 11/03/20 10:27 AM   Specimen: Nasopharyngeal Swab; Nasopharyngeal(NP) swabs in vial transport medium  Result Value Ref Range Status   SARS Coronavirus 2 by RT PCR NEGATIVE NEGATIVE Final    Comment: (NOTE) SARS-CoV-2 target nucleic acids are NOT DETECTED.  The SARS-CoV-2 RNA is generally detectable in upper respiratory specimens during the acute phase of infection. The lowest concentration of SARS-CoV-2 viral copies this assay can detect is 138 copies/mL. A negative result does not preclude SARS-Cov-2 infection and should not be used as the sole basis for treatment or other patient management decisions. A negative result may occur with  improper specimen collection/handling, submission of specimen other than nasopharyngeal swab, presence of viral mutation(s) within the areas targeted by this assay, and  inadequate number of viral copies(<138 copies/mL). A negative result must be combined with clinical  observations, patient history, and epidemiological information. The expected result is Negative.  Fact Sheet for Patients:  EntrepreneurPulse.com.au  Fact Sheet for Healthcare Providers:  IncredibleEmployment.be  This test is no t yet approved or cleared by the [place] FDA and  has been authorized for detection and/or diagnosis of SARS-CoV-2 by FDA under an Emergency Use Authorization (EUA). This EUA will remain  in effect (meaning this test can be used) for the duration of the COVID-19 declaration under Section 564(b)(1) of the Act, 21 U.S.C.section 360bbb-3(b)(1), unless the authorization is terminated  or revoked sooner.       Influenza A by PCR NEGATIVE NEGATIVE Final   Influenza B by PCR NEGATIVE NEGATIVE Final    Comment: (NOTE) The Xpert Xpress SARS-CoV-2/FLU/RSV plus assay is intended as an aid in the diagnosis of influenza from Nasopharyngeal swab specimens and should not be used as a sole basis for treatment. Nasal washings and aspirates are unacceptable for Xpert Xpress SARS-CoV-2/FLU/RSV testing.  Fact Sheet for Patients: EntrepreneurPulse.com.au  Fact Sheet for Healthcare Providers: IncredibleEmployment.be  This test is not yet approved or cleared by the [place] FDA and has been authorized for detection and/or diagnosis of SARS-CoV-2 by FDA under an Emergency Use Authorization (EUA). This EUA will remain in effect (meaning this test can be used) for the duration of the COVID-19 declaration under Section 564(b)(1) of the Act, 21 U.S.C. section 360bbb-3(b)(1), unless the authorization is terminated or revoked.  Performed at [facility], [place] [address], [place], [place] [address]   Urine culture     Status: Abnormal (Preliminary result)   Collection Time: 11/03/20 11:55 AM   Specimen: Urine, Random  Result Value Ref Range Status   Specimen Description URINE, RANDOM   Final   Special Requests NONE  Final   Culture (A)  Final    >=100,000 COLONIES/mL ESCHERICHIA COLI SUSCEPTIBILITIES TO FOLLOW Performed at [facility], [place] [address], [place], [place] [address]    Report Status PENDING  Incomplete    Please note: You were cared for by a hospitalist during your hospital stay. Once you are discharged, your primary care physician will handle any further medical issues. Please note that NO REFILLS for any discharge medications will be authorized once you are discharged, as it is imperative that you return to your primary care physician (or establish a relationship with a primary care physician if you do not have one) for your post hospital discharge needs so that they can reassess your need for medications and monitor your lab values.    Time coordinating discharge: 40 minutes  SIGNED:   Shelly Coss, MD  [facility] 11/04/2020, 6:25 PM Pager [phone]  If 7PM-7AM, please contact night-coverage [email or web address] Password [email or web address]

## 2020-11-04 NOTE — Transfer of Care (Signed)
Immediate Anesthesia Transfer of Care Note  Patient: [name]  Procedure(s) Performed: ESOPHAGOGASTRODUODENOSCOPY (EGD) WITH PROPOFOL (N/A ) COLONOSCOPY WITH PROPOFOL (N/A ) GIVENS CAPSULE STUDY (N/A ) HOT HEMOSTASIS (ARGON PLASMA COAGULATION/BICAP) (N/A ) POLYPECTOMY  Patient Location: Endoscopy Unit  Anesthesia Type:MAC  Level of Consciousness: awake and drowsy  Airway & Oxygen Therapy: Patient Spontanous Breathing  Post-op Assessment: Report given to RN and Post -op Vital signs reviewed and stable  Post vital signs: Reviewed and stable  Last Vitals:  Vitals Value Taken Time  BP 131/37 11/04/20 0942  Temp    Pulse 79 11/04/20 0942  Resp 9 11/04/20 0942  SpO2 92 % 11/04/20 0942  Vitals shown include unvalidated device data.  Last Pain:  Vitals:   11/04/20 0803  TempSrc: Temporal  PainSc: 0-No pain         Complications: No complications documented.

## 2020-11-04 NOTE — Anesthesia Postprocedure Evaluation (Signed)
Anesthesia Post Note  Patient: [name]  Procedure(s) Performed: ESOPHAGOGASTRODUODENOSCOPY (EGD) WITH PROPOFOL (N/A ) COLONOSCOPY WITH PROPOFOL (N/A ) GIVENS CAPSULE STUDY (N/A ) HOT HEMOSTASIS (ARGON PLASMA COAGULATION/BICAP) (N/A ) POLYPECTOMY     Patient location during evaluation: PACU Anesthesia Type: MAC Level of consciousness: awake and alert Pain management: pain level controlled Vital Signs Assessment: post-procedure vital signs reviewed and stable Respiratory status: spontaneous breathing and respiratory function stable Cardiovascular status: stable Postop Assessment: no apparent nausea or vomiting Anesthetic complications: no   No complications documented.  Last Vitals:  Vitals:   11/04/20 0953 11/04/20 1002  BP: (!) 138/53 (!) 148/41  Pulse: 79 83  Resp: 16 13  Temp:    SpO2: 91% 93%    Last Pain:  Vitals:   11/04/20 1002  TempSrc:   PainSc: 0-No pain                 Merlinda Frederick

## 2020-11-05 ENCOUNTER — Encounter (HOSPITAL_COMMUNITY): Payer: Self-pay | Admitting: Gastroenterology

## 2020-11-05 LAB — URINE CULTURE: Culture: >=100000 — AB

## 2020-11-05 LAB — IGA: IgA: 237 mg/dL (ref 64–422)

## 2020-11-06 ENCOUNTER — Other Ambulatory Visit: Payer: Self-pay | Admitting: Family

## 2020-11-06 LAB — TYPE AND SCREEN
ABO/RH(D): A POS
Antibody Screen: NEGATIVE
Unit division: 0
Unit division: 0

## 2020-11-06 LAB — TISSUE TRANSGLUTAMINASE, IGA: Tissue Transglutaminase Ab, IgA: <2 U/mL (ref 0–3)

## 2020-11-06 LAB — BPAM RBC
Blood Product Expiration Date: 202204192359
Blood Product Expiration Date: 202204192359
ISSUE DATE / TIME: 202203241045
Unit Type and Rh: 6200
Unit Type and Rh: 6200

## 2020-11-07 LAB — SURGICAL PATHOLOGY

## 2020-11-07 NOTE — Telephone Encounter (Signed)
Patient is requesting a refill of the following medications: Requested Prescriptions   Pending Prescriptions Disp Refills   HYDROcodone-acetaminophen (NORCO/VICODIN) 5-325 MG tablet [Pharmacy Med Name: hydrocodone 5 mg-acetaminophen 325 mg tablet] 30 tablet 0    Sig: TAKE ONE TABLET AT BEDTIME.    Date of patient request: 11/06/20 Last office visit: 09/23/20 Date of last refill: 08/16/20 Last refill amount: 30 + 0 Follow up time period per chart: 11/18/20

## 2020-11-08 ENCOUNTER — Encounter: Payer: Self-pay | Admitting: Internal Medicine

## 2020-11-08 DIAGNOSIS — K922 Gastrointestinal hemorrhage, unspecified: Secondary | ICD-10-CM

## 2020-11-10 ENCOUNTER — Other Ambulatory Visit (INDEPENDENT_AMBULATORY_CARE_PROVIDER_SITE_OTHER): Payer: HMO

## 2020-11-10 DIAGNOSIS — K922 Gastrointestinal hemorrhage, unspecified: Secondary | ICD-10-CM | POA: Diagnosis not present

## 2020-11-10 LAB — CBC
HCT: 32.7 % — ABNORMAL LOW (ref 36.0–46.0)
Hemoglobin: 10.4 g/dL — ABNORMAL LOW (ref 12.0–15.0)
MCHC: 31.7 g/dL (ref 30.0–36.0)
MCV: 80 fl (ref 78.0–100.0)
Platelets: 292 10*3/uL (ref 150.0–400.0)
RBC: 4.08 Mil/uL (ref 3.87–5.11)
RDW: 28.2 % — ABNORMAL HIGH (ref 11.5–15.5)
WBC: 6.5 10*3/uL (ref 4.0–10.5)

## 2020-11-10 LAB — COMPREHENSIVE METABOLIC PANEL
ALT: 14 U/L (ref 0–35)
AST: 38 U/L — ABNORMAL HIGH (ref 0–37)
Albumin: 4.2 g/dL (ref 3.5–5.2)
Alkaline Phosphatase: 66 U/L (ref 39–117)
BUN: 16 mg/dL (ref 6–23)
CO2: 30 mEq/L (ref 19–32)
Calcium: 9.3 mg/dL (ref 8.4–10.5)
Chloride: 91 mEq/L — ABNORMAL LOW (ref 96–112)
Creatinine, Ser: 1.59 mg/dL — ABNORMAL HIGH (ref 0.40–1.20)
GFR: 32.37 mL/min — ABNORMAL LOW (ref >60.00)
Glucose, Bld: 146 mg/dL — ABNORMAL HIGH (ref 70–99)
Potassium: 4.4 mEq/L (ref 3.5–5.1)
Sodium: 128 mEq/L — ABNORMAL LOW (ref 135–145)
Total Bilirubin: 0.5 mg/dL (ref 0.2–1.2)
Total Protein: 7.4 g/dL (ref 6.0–8.3)

## 2020-11-11 ENCOUNTER — Other Ambulatory Visit: Payer: Self-pay

## 2020-11-11 ENCOUNTER — Ambulatory Visit (INDEPENDENT_AMBULATORY_CARE_PROVIDER_SITE_OTHER): Payer: HMO | Admitting: Podiatry

## 2020-11-11 DIAGNOSIS — E1159 Type 2 diabetes mellitus with other circulatory complications: Secondary | ICD-10-CM | POA: Diagnosis not present

## 2020-11-11 DIAGNOSIS — M2141 Flat foot [pes planus] (acquired), right foot: Secondary | ICD-10-CM

## 2020-11-11 DIAGNOSIS — M2012 Hallux valgus (acquired), left foot: Secondary | ICD-10-CM

## 2020-11-11 DIAGNOSIS — M2011 Hallux valgus (acquired), right foot: Secondary | ICD-10-CM

## 2020-11-11 DIAGNOSIS — E1142 Type 2 diabetes mellitus with diabetic polyneuropathy: Secondary | ICD-10-CM

## 2020-11-11 DIAGNOSIS — E1140 Type 2 diabetes mellitus with diabetic neuropathy, unspecified: Secondary | ICD-10-CM | POA: Diagnosis not present

## 2020-11-11 DIAGNOSIS — M2142 Flat foot [pes planus] (acquired), left foot: Secondary | ICD-10-CM

## 2020-11-11 NOTE — Progress Notes (Signed)
The patient presented to the office on 11-11-2020 to pick up diabetic shoes and 3 pair diabetic custom insert.  1 pair of inserts were put in the shoes and the shoes were fitted to the patient. The patient states they are comfortable and free of defect. She was satisfied with the fit of the shoe. Instructions for break in and wear were dispensed. The patient signed the delivery documentation and break in instruction form.  If any concerns or questions arise, she is instructed to call.

## 2020-11-14 ENCOUNTER — Other Ambulatory Visit: Payer: Self-pay | Admitting: Internal Medicine

## 2020-11-14 DIAGNOSIS — D500 Iron deficiency anemia secondary to blood loss (chronic): Secondary | ICD-10-CM | POA: Diagnosis not present

## 2020-11-14 DIAGNOSIS — K625 Hemorrhage of anus and rectum: Secondary | ICD-10-CM | POA: Diagnosis not present

## 2020-11-14 DIAGNOSIS — R899 Unspecified abnormal finding in specimens from other organs, systems and tissues: Secondary | ICD-10-CM | POA: Diagnosis not present

## 2020-11-18 ENCOUNTER — Ambulatory Visit: Payer: HMO | Admitting: Internal Medicine

## 2020-11-21 ENCOUNTER — Encounter: Payer: Self-pay | Admitting: Cardiovascular Disease

## 2020-11-21 ENCOUNTER — Other Ambulatory Visit: Payer: Self-pay

## 2020-11-21 ENCOUNTER — Ambulatory Visit: Payer: HMO | Admitting: Physician Assistant

## 2020-11-21 ENCOUNTER — Ambulatory Visit: Payer: HMO | Admitting: Cardiovascular Disease

## 2020-11-21 VITALS — BP 120/68 | HR 84 | Ht 61.5 in | Wt 183.0 lb

## 2020-11-21 DIAGNOSIS — Z7901 Long term (current) use of anticoagulants: Secondary | ICD-10-CM

## 2020-11-21 DIAGNOSIS — I700 Atherosclerosis of aorta: Secondary | ICD-10-CM

## 2020-11-21 DIAGNOSIS — I10 Essential (primary) hypertension: Secondary | ICD-10-CM

## 2020-11-21 DIAGNOSIS — E1169 Type 2 diabetes mellitus with other specified complication: Secondary | ICD-10-CM | POA: Diagnosis not present

## 2020-11-21 DIAGNOSIS — I4821 Permanent atrial fibrillation: Secondary | ICD-10-CM

## 2020-11-21 DIAGNOSIS — E669 Obesity, unspecified: Secondary | ICD-10-CM | POA: Diagnosis not present

## 2020-11-21 DIAGNOSIS — E785 Hyperlipidemia, unspecified: Secondary | ICD-10-CM

## 2020-11-21 DIAGNOSIS — I2510 Atherosclerotic heart disease of native coronary artery without angina pectoris: Secondary | ICD-10-CM | POA: Diagnosis not present

## 2020-11-21 DIAGNOSIS — J449 Chronic obstructive pulmonary disease, unspecified: Secondary | ICD-10-CM | POA: Diagnosis not present

## 2020-11-21 DIAGNOSIS — G4761 Periodic limb movement disorder: Secondary | ICD-10-CM

## 2020-11-21 DIAGNOSIS — I5022 Chronic systolic (congestive) heart failure: Secondary | ICD-10-CM

## 2020-11-21 NOTE — Progress Notes (Signed)
Cardiology Office Note:    Date:  11/26/2020   ID:  [name], DOB Mar 17, 1951, MRN [id number]  PCP:  Hoyt Koch, MD  Cardiologist:  New ([facility] clinic, [place]) Electrophysiologist:  None   Referring MD: Hoyt Koch, *   Chief Complaint  Patient presents with  . Follow-up    History of Present Illness:    [name] is a 70 y.o. female with a hx of CAD (posterior lateral ventricular branch stent in 2009), longstanding persistent atrial fibrillation, 3, diabetes mellitus type 2, hypertension, pericardiocentesis for hemothorax with tamponade in June 2020, while on treatment with oral anticoagulant. She is a retired Electrical engineer.  She was recently hospitalized for lower GI bleeding with bright red blood per rectum and a hemoglobin of 6 requiring transfusion of 2 units PRBC.  She underwent upper endoscopy and colonoscopy with Dr. Cristina Gong on 11/04/2020, followed by a capsule enteroscopy on 11/07/2020.  The only identifiable source of bleeding were couple of AVMs in the colon, including one in the cecum that was friable and bled on contact.  This was electrocauterized.  Xarelto has been restarted.  She has not had recurrent bleeding in the couple of weeks that have passed since that.  She has not had problems with chest discomfort or shortness of breath.  Her weight is close to her previously established "dry weight" of about 182 pounds.  She does not have edema, or PND.  She is not aware of palpitations.  She has not had any focal neurological events.  Her renal function is at baseline with the most recent creatinine of 1.33.  Her chest x-ray during the hospitalization showed no evidence of volume overload.  Heart rate remained well controlled.  Her last echocardiogram in December 2020 showed LVEF of 45-50%.  This was a decline from June 2020 when the EF was 60%.  The most recent echocardiogram performed in April 2021 showed LVEF of 50-55%.  Both atria are  moderately dilated.  There were no serious valvular abnormalities.  She had a normal perfusion pattern on her nuclear stress tests from 2012, 2014 and 2016.  Past Medical History:  Diagnosis Date  . Anemia, unspecified   . Arthritis   . Asthma   . Atrial fibrillation with RVR ([place])    a. on Xarelto  . Bell's palsy    Facial nerve decompression in 2001  . CHF (congestive heart failure) ([place])   . Chronic low back pain   . COPD with asthma ([place])   . Coronary artery disease    Myoview 04/12/11 was entirely normal. ECHO 02/26/08 showed only minor abnormalities. Stenting 05/26/08 of her posterolateral branch to the left circumflex coronary artery. Used a 2.5x61mm Taxus Monorail stent.myoview 2014 was without ischemia  . Diabetes mellitus    Type 2  . Early cataracts, bilateral   . Fatty liver   . GERD (gastroesophageal reflux disease)   . Glaucoma   . Glaucoma   . Goiter   . Heart murmur   . History of nuclear stress test 2012; 2014   lexiscan; normal pattern of perfusion; normal, low risk scan   . Hyperlipidemia   . Hypertension   . Panic disorder   . Pneumonia 2008  . Polycystic ovary disease    Hysterectomy in 1982 for this  . Shortness of breath dyspnea    ECHO 02/26/08 showed only minor abnormalities  . Spinal stenosis     Past Surgical History:  Procedure Laterality Date  .  ABDOMINAL HYSTERECTOMY  1982   & BSO; for polycystic ovary disease  . CARDIOVERSION N/A 12/17/2013   Procedure: CARDIOVERSION;  Surgeon: Pixie Casino, MD;  Location: [facility] ENDOSCOPY;  Service: Cardiovascular;  Laterality: N/A;  . CARDIOVERSION N/A 09/30/2015   Procedure: CARDIOVERSION;  Surgeon: Dorothy Spark, MD;  Location: [facility] ENDOSCOPY;  Service: Cardiovascular;  Laterality: N/A;  . CARDIOVERSION N/A 06/22/2016   Procedure: CARDIOVERSION;  Surgeon: Skeet Latch, MD;  Location: [facility] ENDOSCOPY;  Service: Cardiovascular;  Laterality: N/A;  . CENTRAL LINE INSERTION  01/13/2019   Procedure: CENTRAL  LINE INSERTION;  Surgeon: Jolaine Artist, MD;  Location: [place] CV LAB;  Service: Cardiovascular;;  . COLONOSCOPY     last 2009; Dr Cristina Gong; due 2019  . COLONOSCOPY WITH PROPOFOL N/A 01/23/2018   Procedure: COLONOSCOPY WITH PROPOFOL;  Surgeon: Ronald Lobo, MD;  Location: [place];  Service: Endoscopy;  Laterality: N/A;  . COLONOSCOPY WITH PROPOFOL N/A 11/04/2020   Procedure: COLONOSCOPY WITH PROPOFOL;  Surgeon: Ronald Lobo, MD;  Location: [place];  Service: Endoscopy;  Laterality: N/A;  . CORONARY ANGIOPLASTY  05/26/2008   Stenting of her posterolateral branch to the left circumflex coronary artery. Used a 2.5x94mm Taxus Monorail stent.  . ESOPHAGOGASTRODUODENOSCOPY (EGD) WITH PROPOFOL N/A 01/23/2018   Procedure: ESOPHAGOGASTRODUODENOSCOPY (EGD) WITH PROPOFOL;  Surgeon: Ronald Lobo, MD;  Location: [place];  Service: Endoscopy;  Laterality: N/A;  . ESOPHAGOGASTRODUODENOSCOPY (EGD) WITH PROPOFOL N/A 11/04/2020   Procedure: ESOPHAGOGASTRODUODENOSCOPY (EGD) WITH PROPOFOL;  Surgeon: Ronald Lobo, MD;  Location: [place];  Service: Endoscopy;  Laterality: N/A;  . FACIAL NERVE DECOMPRESSION  2001/2002   bells palsy   . GIVENS CAPSULE STUDY N/A 11/04/2020   Procedure: GIVENS CAPSULE STUDY;  Surgeon: Ronald Lobo, MD;  Location: [facility] ENDOSCOPY;  Service: Endoscopy;  Laterality: N/A;  . HOT HEMOSTASIS N/A 11/04/2020   Procedure: HOT HEMOSTASIS (ARGON PLASMA COAGULATION/BICAP);  Surgeon: Ronald Lobo, MD;  Location: [facility] ENDOSCOPY;  Service: Endoscopy;  Laterality: N/A;  . LAPAROSCOPIC CHOLECYSTECTOMY  06/15/2011    Dr Dalbert Batman  . PERICARDIOCENTESIS N/A 01/13/2019   Procedure: PERICARDIOCENTESIS;  Surgeon: Burnell Blanks, MD;  Location: [place] CV LAB;  Service: Cardiovascular;  Laterality: N/A;  . POLYPECTOMY  11/04/2020   Procedure: POLYPECTOMY;  Surgeon: Ronald Lobo, MD;  Location: [place];  Service: Endoscopy;;  . RIGHT AND LEFT HEART CATH N/A  01/13/2019   Procedure: RIGHT AND LEFT HEART CATH;  Surgeon: Jolaine Artist, MD;  Location: [place] CV LAB;  Service: Cardiovascular;  Laterality: N/A;  . TEE WITHOUT CARDIOVERSION N/A 12/17/2013   Procedure: TRANSESOPHAGEAL ECHOCARDIOGRAM (TEE);  Surgeon: Pixie Casino, MD;  Location: [facility] ENDOSCOPY;  Service: Cardiovascular;  Laterality: N/A;  trish/ja  . TRANSTHORACIC ECHOCARDIOGRAM  2/69/4854   LV systolic function normal with mild conc LVH; LA mildly dilated; trace MR/TR  . UPPER GI ENDOSCOPY  2009   negative    Current Medications: Current Meds  Medication Sig  . ascorbic acid (VITAMIN C) 1000 MG tablet Take 1,000 mg by mouth daily.  [name] CALCIUM-MAG-VIT C-VIT D PO Take 1 tablet by mouth daily.  . canagliflozin (INVOKANA) 100 MG TABS tablet Take 1 tablet (100 mg total) by mouth daily before breakfast.  . celecoxib (CELEBREX) 200 MG capsule TAKE (1) CAPSULE TWICE DAILY.  . colestipol (COLESTID) 1 g tablet Take 1 g by mouth as needed (stomach).  . diltiazem (CARDIZEM CD) 180 MG 24 hr capsule TAKE 1 CAPSULE DAILY. (Patient taking differently: Take 180 mg by mouth  daily.)  . famotidine (PEPCID) 20 MG tablet Take 20 mg by mouth 2 (two) times daily.  [name] gabapentin (NEURONTIN) 100 MG capsule TAKE (1) CAPSULE THREE TIMES DAILY. (Patient taking differently: Take 100 mg by mouth 2 (two) times daily.)  . guaiFENesin (MUCINEX) 600 MG 12 hr tablet Take 600 mg by mouth 2 (two) times daily as needed for cough.   [name] HYDROcodone-acetaminophen (NORCO/VICODIN) 5-325 MG tablet Take 0.5 tablets by mouth at bedtime.  [name] latanoprost (XALATAN) 0.005 % ophthalmic solution Place 1 drop into both eyes at bedtime.  [name] LORazepam (ATIVAN) 0.5 MG tablet TAKE 1 OR 2 TABLETS AT BEDTIME. (Patient taking differently: Take 0.5-1 mg by mouth at bedtime.)  . metoprolol succinate (TOPROL-XL) 100 MG 24 hr tablet TAKE ONE TABLET TWICE A DAY WITH OR IMMEDIATELY FOLLOWING A MEAL. (Patient taking differently: Take 100 mg by mouth  2 (two) times daily.)  . Multiple Minerals-Vitamins (CALCIUM CITRATE PLUS/MAGNESIUM PO) Take 1 tablet by mouth daily.  . Multiple Vitamins-Iron (MULTIVITAMINS WITH IRON) TABS Take 1 tablet by mouth daily.  [name] nystatin ointment (MYCOSTATIN) Apply 1 application topically 2 (two) times daily as needed (IRRITATION).   [name] omeprazole (PRILOSEC) 20 MG capsule Take 1 capsule (20 mg total) by mouth 2 (two) times daily before a meal.  . ONETOUCH DELICA LANCETS FINE MISC 1 Units by Does not apply route as directed.  [name] PARoxetine (PAXIL) 10 MG tablet TAKE 2 AND 1/2 TABS DAILY  . quinapril (ACCUPRIL) 40 MG tablet TAKE 1 TABLET ONCE DAILY. (Patient taking differently: Take 40 mg by mouth at bedtime.)  . rivaroxaban (XARELTO) 20 MG TABS tablet Take 1 tablet (20 mg total) by mouth daily with supper. Restart only on Sunday  . rOPINIRole (REQUIP) 0.5 MG tablet TAKE 1 TO 2 TABLETS AT MIDNIGHT. (Patient taking differently: Take 0.5-1 mg by mouth at bedtime.)  . rosuvastatin (CRESTOR) 10 MG tablet Take 1 tablet (10 mg total) by mouth daily. (Patient taking differently: Take 10 mg by mouth every evening.)  . senna (SENOKOT) 8.6 MG TABS Take 1 tablet by mouth at bedtime as needed for mild constipation.  . sitaGLIPtin-metformin (JANUMET) 50-1000 MG tablet Take 1 tablet by mouth 2 (two) times daily with a meal. TAKE 1/2 TABLET IN THE MORNING WITH BREAKFAST AND 1 TABLET IN THE EVENING WITH SUPPER. (Patient taking differently: Take 0.5-1 tablets by mouth See admin instructions. TAKE 1/2 TABLET IN THE MORNING WITH BREAKFAST AND 1 TABLET IN THE EVENING WITH SUPPER.)  . torsemide (DEMADEX) 20 MG tablet TAKE (2) TABLETS TWICE DAILY. (Patient taking differently: Take 40 mg by mouth 2 (two) times daily.)  . traZODone (DESYREL) 100 MG tablet TAKE 1/2 TO 1 TABLET AT BEDTIME. (Patient taking differently: Take 100 mg by mouth at bedtime.)  . umeclidinium-vilanterol (ANORO ELLIPTA) 62.5-25 MCG/INH AEPB Inhale 1 puff into the lungs daily.   . [DISCONTINUED] potassium chloride (MICRO-K) 10 MEQ CR capsule TAKE (2) CAPSULES DAILY. (Patient taking differently: Take 10 mEq by mouth 2 (two) times daily.)     Allergies:   Benadryl [diphenhydramine hcl], Clopidogrel bisulfate, and Contrast media [iodinated diagnostic agents]   Social History   Socioeconomic History  . Marital status: Married    Spouse name: Not on file  . Number of children: 2  . Years of education: master's  . Highest education level: Not on file  Occupational History  . Occupation: Electrical engineer    Employer: [employer]  Tobacco Use  . Smoking status: Former Smoker  Packs/day: 1.50    Years: 30.00    Pack years: 45.00    Types: Cigarettes    Quit date: 08/13/1993    Years since quitting: 27.3  . Smokeless tobacco: Never Used  Vaping Use  . Vaping Use: Never used  Substance and Sexual Activity  . Alcohol use: No  . Drug use: No  . Sexual activity: Not on file  Other Topics Concern  . Not on file  Social History Narrative  . Not on file   Social Determinants of Health   Financial Resource Strain: Not on file  Food Insecurity: Not on file  Transportation Needs: Not on file  Physical Activity: Not on file  Stress: Not on file  Social Connections: Not on file     Family History: The patient's family history includes Cancer in her brother and sister; Colon cancer (age of onset: 64) in her brother; Colon polyps in her sister; Emphysema (age of onset: 51) in her mother; Gout in her brother; Heart attack (age of onset: 52) in her father; Hyperlipidemia in her brother; Hypertension in her brother; Pneumonia in her maternal grandmother; Ulcers in her mother. There is no history of Diabetes or Stroke.  ROS:   Please see the history of present illness.    All other systems are reviewed and are negative.   EKGs/Labs/Other Studies Reviewed:    The following studies were reviewed today: Echocardiogram December 09, 2019 1. Abnormal  septal motion and hypokinesis as well as inferior basal  hypokinesis EF similar to echo done 05/2019 or slightly better . Left  ventricular ejection fraction, by estimation, is 50 to 55%. The left  ventricle has low normal function. The left  ventricle demonstrates regional wall motion abnormalities (see scoring  diagram/findings for description). Left ventricular diastolic parameters  are indeterminate.  2. Right ventricular systolic function is normal. The right ventricular  size is normal.  3. Left atrial size was moderately dilated.  4. Right atrial size was moderately dilated.  5. The mitral valve is degenerative. Trivial mitral valve regurgitation.  No evidence of mitral stenosis.  6. The aortic valve is tricuspid. Aortic valve regurgitation is trivial.  Mild to moderate aortic valve sclerosis/calcification is present, without  any evidence of aortic stenosis.  7. The inferior vena cava is normal in size with greater than 50%  respiratory variability, suggesting right atrial pressure of 3 mmHg.   EKG:  EKG is not ordered today.  The inpatient ECG from 11/03/2020 was personally reviewed.  It shows atrial fibrillation with borderline RVR at 202 bpm.  Recent Labs:  09/23/2020 hemoglobin A1c 7.3% 09/23/2020: Pro B Natriuretic peptide (BNP) 224.0; TSH 3.10 11/04/2020: Magnesium 1.5 11/22/2020: ALT 13; BUN 22; Creatinine, Ser 1.30; Hemoglobin 10.8; Platelets 233.0; Potassium 4.4; Sodium 128   11/14/2020  Creatinine 1.33, potassium 4.3, hemoglobin 10.3 Recent Lipid Panel    Component Value Date/Time   CHOL 104 09/23/2020 1601   CHOL 188 11/17/2019 1534   TRIG 150.0 (H) 09/23/2020 1601   HDL 38.10 (L) 09/23/2020 1601   HDL 43 11/17/2019 1534   CHOLHDL 3 09/23/2020 1601   VLDL 30.0 09/23/2020 1601   LDLCALC 36 09/23/2020 1601   LDLCALC 108 (H) 11/17/2019 1534   LDLDIRECT 143.4 01/23/2008 1316    Physical Exam:    VS:  BP 120/68   Pulse 84   Ht 5' 1.5" (1.562 m)   Wt  183 lb (83 kg)   SpO2 95%   BMI 34.02 kg/m  Wt Readings from Last 3 Encounters:  11/22/20 181 lb 9.6 oz (82.4 kg)  11/21/20 183 lb (83 kg)  11/04/20 190 lb (86.2 kg)     General: Alert, oriented x3, no distress, moderately obese.  She does not appear to be pale Head: no evidence of trauma, PERRL, EOMI, no exophtalmos or lid lag, no myxedema, no xanthelasma; normal ears, nose and oropharynx Neck: normal jugular venous pulsations and no hepatojugular reflux; brisk carotid pulses without delay and no carotid bruits Chest: clear to auscultation, no signs of consolidation by percussion or palpation, normal fremitus, symmetrical and full respiratory excursions Cardiovascular: normal position and quality of the apical impulse, irregular rhythm, normal first and second heart sounds, no murmurs, rubs or gallops Abdomen: no tenderness or distention, no masses by palpation, no abnormal pulsatility or arterial bruits, normal bowel sounds, no hepatosplenomegaly Extremities: no clubbing, cyanosis or edema; 2+ radial, ulnar and brachial pulses bilaterally; 2+ right femoral, posterior tibial and dorsalis pedis pulses; 2+ left femoral, posterior tibial and dorsalis pedis pulses; no subclavian or femoral bruits Neurological: grossly nonfocal Psych: Normal mood and affect     ASSESSMENT:    1. Permanent atrial fibrillation ([place])   2. Chronic systolic heart failure ([place])   3. Long term (current) use of anticoagulants   4. Coronary artery disease involving native coronary artery of native heart without angina pectoris   5. Essential hypertension   6. Hyperlipidemia associated with type 2 diabetes mellitus ([place])   7. Diabetes mellitus type 2 in obese ([place])   8. Periodic limb movement disorder   9. COPD mixed type ([place])   10. Aortic atherosclerosis ([facility])    PLAN:    In order of problems listed above:  1. AFib: Good rate control even in the face of moderately severe anemia metoprolol.  No changes  made.  Good rate control is important in view of previous history of tachycardia cardiomyopathy.  Has restarted rivaroxaban without repeat bleeding events.  CHA2DS2-VASc 6 (age, gender, hypertension, diabetes, CAD, history of heart failure/LV dysfunction).  If she has recurrent problems with lower GI bleeding, I believe she is an excellent candidate for a Watchman device.  We talked about the purpose of device, implantation procedure follow-up anticoagulation protocol and possible complications.  She appears skeptical but is willing to research the topic. 2. CHF: She appears to be euvolemic.  Not requiring loop diuretics.  She is taking an SGLT2 inhibitor, although not one that is approved for diastolic heart failure. 3. Anticoagulation: She has a history of hemopericardium, previous iron deficiency anemia and now serious lower GI bleeding.  I think Watchman would be a good idea. 4. CAD: Asymptomatic even with anemia.  She has not required any revascularization procedure since 2009, with normal nuclear stress test in 2012, 2014 and 2016. 5. HTN: Well-controlled. 6. HLP: Excellent LDL on most recent lipid profile.  HDL borderline low.  Weight loss would be beneficial. 7. DM: Fair control with A1c of 7.3% on combination of SGLT2 2 inhibitor Metformin and sitagliptin.  Not requiring insulin. 8. Periodic limb movement syndrome: Saw Dr. Brett Fairy for sleep clinic evaluation in March 2021.  On Requip.  9. COPD: Saw Dr. Annamaria Boots in January 2022.  FEV1 not too bad at 1.66 L / 82% of predicted. 10. Aortic atherosclerosis: Aortic calcification with normal caliber aorta seen on chest x-ray and chest CT. continue statin therapy.  Medication Adjustments/Labs and Tests Ordered: Current medicines are reviewed at length with the patient today.  Concerns regarding  medicines are outlined above.  No orders of the defined types were placed in this encounter.  No orders of the defined types were placed in this  encounter.   Patient Instructions  Medication Instructions:  No changes *If you need a refill on your cardiac medications before your next appointment, please call your pharmacy*   Lab Work: None ordered If you have labs (blood work) drawn today and your tests are completely normal, you will receive your results only by: [name] MyChart Message (if you have MyChart) OR . A paper copy in the mail If you have any lab test that is abnormal or we need to change your treatment, we will call you to review the results.   Testing/Procedures: None ordered   Follow-Up: At [facility], you and your health needs are our priority.  As part of our continuing mission to provide you with exceptional heart care, we have created designated Provider Care Teams.  These Care Teams include your primary Cardiologist (physician) and Advanced Practice Providers (APPs -  Physician Assistants and Nurse Practitioners) who all work together to provide you with the care you need, when you need it.  We recommend signing up for the patient portal called "MyChart".  Sign up information is provided on this After Visit Summary.  MyChart is used to connect with patients for Virtual Visits (Telemedicine).  Patients are able to view lab/test results, encounter notes, upcoming appointments, etc.  Non-urgent messages can be sent to your provider as well.   To learn more about what you can do with MyChart, go to [email or web address].    Your next appointment:   6 month(s)  The format for your next appointment:   In Person  Provider:   You may see Sanda Klein, MD or one of the following Advanced Practice Providers on your designated Care Team:    Almyra Deforest, PA-C  Fabian Sharp, [place] or   Roby Lofts, PA-C       Signed, Sanda Klein, MD  11/26/2020 4:44 PM    [place]

## 2020-11-21 NOTE — Patient Instructions (Signed)

## 2020-11-22 ENCOUNTER — Ambulatory Visit (INDEPENDENT_AMBULATORY_CARE_PROVIDER_SITE_OTHER): Payer: HMO | Admitting: Internal Medicine

## 2020-11-22 ENCOUNTER — Encounter: Payer: Self-pay | Admitting: Internal Medicine

## 2020-11-22 ENCOUNTER — Other Ambulatory Visit (INDEPENDENT_AMBULATORY_CARE_PROVIDER_SITE_OTHER): Payer: HMO

## 2020-11-22 VITALS — BP 124/82 | HR 71 | Temp 97.7°F | Resp 18 | Ht 61.5 in | Wt 181.6 lb

## 2020-11-22 DIAGNOSIS — I4821 Permanent atrial fibrillation: Secondary | ICD-10-CM | POA: Diagnosis not present

## 2020-11-22 DIAGNOSIS — K922 Gastrointestinal hemorrhage, unspecified: Secondary | ICD-10-CM | POA: Diagnosis not present

## 2020-11-22 DIAGNOSIS — I10 Essential (primary) hypertension: Secondary | ICD-10-CM | POA: Diagnosis not present

## 2020-11-22 DIAGNOSIS — E871 Hypo-osmolality and hyponatremia: Secondary | ICD-10-CM | POA: Diagnosis not present

## 2020-11-22 DIAGNOSIS — R5383 Other fatigue: Secondary | ICD-10-CM | POA: Diagnosis not present

## 2020-11-22 DIAGNOSIS — D649 Anemia, unspecified: Secondary | ICD-10-CM | POA: Diagnosis not present

## 2020-11-22 NOTE — Progress Notes (Signed)
   Subjective:   Patient ID: [name], female    DOB: [birth year], 70 y.o.   MRN: [id number]  HPI The patient is a 70 YO female coming in for follow up recent hospital stay (in for anemia, had EGD and blood transfusion and recent iron infusion ordered and done prior to hospital stay). She has been feeling better since that stay and transfusion. She prior to that had been unable to walk around the house. She denies current chest pains. Some SOB but more her baseline. She is a little tired still. Denies nausea or vomiting. Denies constipation or diarrhea or blood in stool.   PMH, [facility], social history reviewed and updated.   Review of Systems  Constitutional: Negative.   HENT: Negative.   Eyes: Negative.   Respiratory: Positive for shortness of breath. Negative for cough and chest tightness.   Cardiovascular: Negative for chest pain, palpitations and leg swelling.  Gastrointestinal: Negative for abdominal distention, abdominal pain, constipation, diarrhea, nausea and vomiting.  Musculoskeletal: Negative.   Skin: Negative.   Neurological: Negative.   Psychiatric/Behavioral: Negative.     Objective:  Physical Exam Constitutional:      Appearance: She is well-developed.  HENT:     Head: Normocephalic and atraumatic.  Cardiovascular:     Rate and Rhythm: Normal rate and regular rhythm.  Pulmonary:     Effort: Pulmonary effort is normal. No respiratory distress.     Breath sounds: Normal breath sounds. No wheezing or rales.  Abdominal:     General: Bowel sounds are normal. There is no distension.     Palpations: Abdomen is soft.     Tenderness: There is no abdominal tenderness. There is no rebound.  Musculoskeletal:     Cervical back: Normal range of motion.  Skin:    General: Skin is warm and dry.  Neurological:     Mental Status: She is alert and oriented to person, place, and time.     Coordination: Coordination normal.     Vitals:   11/22/20 1613  BP: 124/82  Pulse:  71  Resp: 18  Temp: 97.7 F (36.5 C)  TempSrc: Oral  SpO2: 96%  Weight: 181 lb 9.6 oz (82.4 kg)  Height: 5' 1.5" (1.562 m)    This visit occurred during the SARS-CoV-2 public health emergency.  Safety protocols were in place, including screening questions prior to the visit, additional usage of staff PPE, and extensive cleaning of exam room while observing appropriate contact time as indicated for disinfecting solutions.   Assessment & Plan:

## 2020-11-22 NOTE — Patient Instructions (Addendum)
We will recheck the levels today.  The celebrex is one which can cause risk of stomach and GI intestinal bleeding so try to reduce to one a day as this will help reduce risk of another GI bleed.

## 2020-11-23 ENCOUNTER — Ambulatory Visit: Payer: HMO | Admitting: Neurology

## 2020-11-23 ENCOUNTER — Other Ambulatory Visit: Payer: Self-pay | Admitting: Cardiovascular Disease

## 2020-11-23 ENCOUNTER — Other Ambulatory Visit: Payer: Self-pay | Admitting: Internal Medicine

## 2020-11-23 LAB — COMPREHENSIVE METABOLIC PANEL
ALT: 13 U/L (ref 0–35)
AST: 28 U/L (ref 0–37)
Albumin: 4.1 g/dL (ref 3.5–5.2)
Alkaline Phosphatase: 67 U/L (ref 39–117)
BUN: 22 mg/dL (ref 6–23)
CO2: 28 mEq/L (ref 19–32)
Calcium: 9.1 mg/dL (ref 8.4–10.5)
Chloride: 91 mEq/L — ABNORMAL LOW (ref 96–112)
Creatinine, Ser: 1.3 mg/dL — ABNORMAL HIGH (ref 0.40–1.20)
GFR: 41.2 mL/min — ABNORMAL LOW (ref >60.00)
Glucose, Bld: 126 mg/dL — ABNORMAL HIGH (ref 70–99)
Potassium: 4.4 mEq/L (ref 3.5–5.1)
Sodium: 128 mEq/L — ABNORMAL LOW (ref 135–145)
Total Bilirubin: 0.4 mg/dL (ref 0.2–1.2)
Total Protein: 7.4 g/dL (ref 6.0–8.3)

## 2020-11-23 LAB — CBC
HCT: 32.5 % — ABNORMAL LOW (ref 36.0–46.0)
Hemoglobin: 10.8 g/dL — ABNORMAL LOW (ref 12.0–15.0)
MCHC: 33.1 g/dL (ref 30.0–36.0)
MCV: 79.7 fl (ref 78.0–100.0)
Platelets: 233 10*3/uL (ref 150.0–400.0)
RBC: 4.09 Mil/uL (ref 3.87–5.11)
RDW: 26.1 % — ABNORMAL HIGH (ref 11.5–15.5)
WBC: 7.1 10*3/uL (ref 4.0–10.5)

## 2020-11-23 LAB — CORTISOL: Cortisol, Plasma: 13 ug/dL

## 2020-11-24 NOTE — Assessment & Plan Note (Signed)
Checking CBC to ensure stability since leaving hospital.

## 2020-11-24 NOTE — Assessment & Plan Note (Signed)
Overall stable in hospital and checking CMP today for stability. No new signs of this.

## 2020-11-24 NOTE — Assessment & Plan Note (Signed)
No current signs of bleeding. Checking CBC for stability.

## 2020-11-24 NOTE — Assessment & Plan Note (Signed)
Cardiologist has talked to her about watchman and she is wanting to do more research about this before she decides. Note not finished from their visit but likely they would be hoping to stop anticoagulation as this is her second serious GI bleed in the last 2-3 years. Taking xarelto 20 mg daily currently and metoprolol for rate control.

## 2020-11-24 NOTE — Assessment & Plan Note (Signed)
BP at goal on diltiazem and metoprolol and quinapril. Checking CMP and adjust as needed.

## 2020-11-28 ENCOUNTER — Other Ambulatory Visit: Payer: Self-pay | Admitting: Neurology

## 2020-11-29 NOTE — Telephone Encounter (Signed)
   Pharmacy calling for status of refill

## 2020-11-30 ENCOUNTER — Telehealth: Payer: Self-pay | Admitting: Internal Medicine

## 2020-11-30 ENCOUNTER — Telehealth: Payer: Self-pay

## 2020-11-30 NOTE — Telephone Encounter (Signed)
Spouse calling to report 3/16 infusion was denied for lack of prior auth Spouse requesting return call

## 2020-11-30 NOTE — Telephone Encounter (Signed)
I have submitted PA request for ropinirole on CMM, Key: [id number] - PA Case ID: [id number].   Awaiting determination from [employer]

## 2020-11-30 NOTE — Telephone Encounter (Signed)
Per Denial Coordinator: [employer] denied it in error. I reviewed the PA list and neither code billed required an authorization. [employer] has sent the claim back for reprocessing. Need to allow 30 days for them to review.   Also, from what I can see, we have not sent this patient a statement yet so I am thinking they got the EOB from [employer] and it probably showed it was denied.   Edd Fabian (spouse) notified of above & is thankful & appreciative of return phone call.

## 2020-12-02 DIAGNOSIS — D500 Iron deficiency anemia secondary to blood loss (chronic): Secondary | ICD-10-CM | POA: Diagnosis not present

## 2020-12-02 DIAGNOSIS — Q2733 Arteriovenous malformation of digestive system vessel: Secondary | ICD-10-CM | POA: Diagnosis not present

## 2020-12-05 NOTE — Telephone Encounter (Signed)
Ropinirole denied. This was the only explanation available on cover my meds, but we should be receiving a denial letter as well.   Would you like to appeal?  "NON-FORMULARY DRUG. TRANSITIONAL PERIOD OVER. USE FORMULARY PRODUCT;CALL [phone];PROVIDE NOTICE-MEDICARE PRESCRIPTION DRUG COVERAGE AND YOUR RIGHTS ([id number]) For [employer] Coupon Price of: $29.94 submit to BIN: [id number] PCN: CP Group: [id number] --Service provided at no cost and no switch fee to the pharmacy--"

## 2020-12-05 NOTE — Telephone Encounter (Addendum)
Spoke with Dr. Brett Fairy. Instead of ropinirole ER 2mg  tablet, she is recommending ropinirole 1mg  tablet, take 1 tablet po between dinner and bedtime and then she can continue ropinirole 0.5mg , take 1-2 tabs po midnight.   We send send in refill until her next appt: 01/12/21  LVM for pt to call office.

## 2020-12-05 NOTE — Telephone Encounter (Addendum)
This is not for IR but extended release form , the 2 mg dose? In that case , we need to give 2 tab of 1 mg ropinorol between dinner and night time in addition to the current dose.   CD

## 2020-12-06 NOTE — Telephone Encounter (Signed)
LVM for pt to call office. Advised I will also send a mychart message.

## 2020-12-07 ENCOUNTER — Encounter: Payer: Self-pay | Admitting: *Deleted

## 2020-12-07 NOTE — Telephone Encounter (Signed)
Sent pt letter

## 2020-12-16 DIAGNOSIS — M5416 Radiculopathy, lumbar region: Secondary | ICD-10-CM | POA: Diagnosis not present

## 2020-12-19 ENCOUNTER — Other Ambulatory Visit: Payer: Self-pay | Admitting: Neurological Surgery

## 2020-12-19 DIAGNOSIS — M5416 Radiculopathy, lumbar region: Secondary | ICD-10-CM

## 2020-12-20 ENCOUNTER — Other Ambulatory Visit: Payer: Self-pay | Admitting: Cardiovascular Disease

## 2020-12-22 ENCOUNTER — Telehealth: Payer: Self-pay | Admitting: Cardiovascular Disease

## 2020-12-22 NOTE — Telephone Encounter (Signed)
   Name: [name]  DOB: [birth year]  MRN: [id number]   Primary Cardiologist: Sanda Klein, MD  Chart reviewed as part of pre-operative protocol coverage. [name] was last seen on 11/21/20 by Dr. Sallyanne Kuster.  Her rate in known atrial fibrillation was well controlled, euvolmic, and no anginal symptoms.  Therefore, based on ACC/AHA guidelines, the patient would be at acceptable risk for the planned procedure without further cardiovascular testing.   Per pharmacy review may hold Xarelto 3 days prior to planned procedure. No pre-op antibiotics required. Left VM per DPR with these instructions to [facility].   I will route this recommendation to the requesting party via Epic fax function and remove from pre-op pool. Please call with questions.  Loel Dubonnet, NP 12/22/2020, 1:38 PM

## 2020-12-22 NOTE — Telephone Encounter (Signed)
   [facility] Pre-operative Risk Assessment    Request for surgical clearance:  1. What type of surgery is being performed?  Patient needs about 10 teeth extracted   2. When is this surgery scheduled?  TBD   3. What type of clearance is required (medical clearance vs. Pharmacy clearance to hold med vs. Both)?  Both  4. Are there any medications that need to be held prior to surgery and how long? Dr. Luretha Rued is inquiring whether any medications need to be held. He is requesting Dr. Croitoru/pre-op's recommendation  5. Practice name and name of physician performing surgery?  6. Dr. [facility]  7. What is your office phone number?  [phone]    7.   What is your office fax number?  Dr. Luretha Rued preferred to provide an e-mail, [email or web address]  8.   Anesthesia type (None, local, MAC, general) ? Local   Zara Council 12/22/2020, 8:14 AM  _________________________________________________________________

## 2020-12-22 NOTE — Telephone Encounter (Signed)
Patient with diagnosis of atrial fibrillation on Xarelto for anticoagulation.    Procedure: extraction of 10 teeth Date of procedure: TBD   CHA2DS2-VASc Score = 6  This indicates a 9.7% annual risk of stroke. The patient's score is based upon: CHF History: Yes HTN History: Yes Diabetes History: Yes Stroke History: No Vascular Disease History: Yes Age Score: 1 Gender Score: 1    CrCl 50.9 Platelet count 233  Note: chart indicates history of DVT, no date found.  Lower extremity US from 01/2019 show no indication of DVT.  Pt history also significant for GI bleed earlier this year as well as prior hematoma to right temple, and hemothorax with tamponade.    Patient does not require pre-op antibiotics for dental procedure.  Per office protocol, patient can hold Xarelto for 3 days prior to procedure.   Patient will not need bridging with Lovenox (enoxaparin) around procedure.

## 2020-12-22 NOTE — Telephone Encounter (Signed)
I have emailed clearance results to [email or web address].

## 2020-12-25 NOTE — Progress Notes (Addendum)
HPI F former smoker last seen in 2020 for COPD with concern of possible DVT, complicated by QJ3/ neuropathy, AFib, CHF,HBP, CAD/PCI, COPD, GERD, Hyperlipidemia, Restless Legs, Obesity, Glaucoma Hosp in June, 2020 with pericardial tamponade, cardiogenic shock, CHF. Echo 11/20/2018- EF 55-60%, mild RAE Walk Test 12/21/19- room air- lowest O2 sat 97%, max HR 126. NPSG 08/15/2017- AHI 0/ hr. desat to 89%/ mean 93%, PLMA 23.7/ hr, body weight 194 lbs ONOX 89% or less  For over 17 minutes ([facility]) PFT 09/30/20--Minimal obstruction (refused bronchodilator trial), possible min restriction, DLCO mild to mod reduction PFT 09/30/20--Minimal obstruction (refused bronchodilator trial), possible min restriction, DLCO mild to mod reduction ONOX 09/04/20 RA- Did not qualify- minimum O2 sat 90% ------------------------------------------------------------------------------   08/01/20- 80yoF former smoker last seen in 3545 for COPD , complicated by GY5/ neuropathy, AFib/ Xarelto, CHF,HBP, CAD/PCI, COPD, GERD, Hyperlipidemia, Restless Legs, Obesity, Glaucoma O2 sleep 2l/ [facility]   Not started yet- needs OV today, then ONOX, then order for O2 all w/in 30 days -Stiolto 2.5 Respimat Covid vax- 3 Phizers Flu vax- had Arrival RA sat 98% today PFT ordered in August- not done C/O dry hacking cough, not wheezing. More shortness of breath, maybe- not acute. Stiolto some help.  CXR 12/22/19-  IMPRESSION: 1.  No radiographic evidence of acute cardiopulmonary disease. 2. Aortic atherosclerosis.  12/26/20- 64yoF former smoker followed  for COPD , complicated by WL8/ neuropathy, AFib/ Xarelto, CHF,HBP, CAD/PCI, GERD, Hyperlipidemia, Restless Legs, Obesity, Glaucoma, Hx DVT, GIB/ Anemia,  >>O2 sleep 2l/ [facility]   Not started yet- needs OV today, then ONOX, then order for O2 all w/in 30 days -Stiolto 2.5 Respimat, Anoro Covid vax- 3 Phizers    >Looking for ONOX result to qual home O2<   - apparently not done. We are ordering  again.                    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 Now getting frequent rechecks and understands contribution to dyspnea.   Considering "Watchman" in lieu of anticoagulation.                             Asks if cheaper alternative to Anoro. PFT 09/30/20--Minimal obstruction (refused bronchodilator trial), possible min restriction, DLCO mild to mod reduction CXR 11/03/20- IMPRESSION: No evidence of active disease.   ROS-see HPI   + = positive Constitutional:    weight loss+ last year, night sweats, fevers, chills, fatigue, lassitude. HEENT:    headaches, difficulty swallowing, tooth/dental problems, sore throat,       sneezing, itching, ear ache, nasal congestion, post nasal drip, snoring CV:    chest pain, orthopnea, PND, +swelling in lower extremities, anasarca,                                  dizziness, +palpitations Resp:   +shortness of breath with exertion or at rest.                productive cough,   +non-productive cough, coughing up of blood.              change in color of mucus.  wheezing.   Skin:    rash or lesions. GI:  No-   heartburn, indigestion, abdominal pain, nausea, vomiting, diarrhea,                 change  in bowel habits, lo+ss of appetite GU: dysuria, change in color of urine, no urgency or frequency.   flank pain. MS:   joint pain, stiffness, decreased range of motion, back pain. Neuro-     nothing unusual Psych:  change in mood or affect.  depression or anxiety.   memory loss.  OBJ- Physical Exam General- Alert, Oriented, Affect-appropriate, Distress- none acute, obese+ Skin- rash-none, lesions- none, excoriation- none Lymphadenopathy- none Head- atraumatic            Eyes- Gross vision intact, PERRLA, conjunctivae and secretions clear            Ears- Hearing, canals-normal            Nose- Clear, no-Septal dev, mucus, polyps, erosion, perforation             Throat- Mallampati II , mucosa clear , drainage- none, tonsils-  atrophic Neck- flexible , trachea midline, no stridor , thyroid nl, carotid no bruit Chest - symmetrical excursion , unlabored           Heart/CV- IRR/AFib+ , no murmur , no gallop  , no rub, nl s1 s2                           - JVD- none , edema- none, stasis changes+, varices-+ superficial           Lung- clear to P&A/ no rales, wheeze- none, cough- none , dullness-none, rub- none           Chest wall-  Abd-  Br/ Gen/ Rectal- Not done, not indicated Extrem- cyanosis- none, clubbing, none, atrophy- none, strength- nl Neuro- grossly intact to observation

## 2020-12-26 ENCOUNTER — Other Ambulatory Visit: Payer: Self-pay | Admitting: Internal Medicine

## 2020-12-26 ENCOUNTER — Other Ambulatory Visit: Payer: Self-pay

## 2020-12-26 ENCOUNTER — Encounter: Payer: Self-pay | Admitting: Internal Medicine

## 2020-12-26 ENCOUNTER — Ambulatory Visit: Payer: HMO | Admitting: Internal Medicine

## 2020-12-26 DIAGNOSIS — J9611 Chronic respiratory failure with hypoxia: Secondary | ICD-10-CM

## 2020-12-26 NOTE — Patient Instructions (Addendum)
If we can't find a recent overnight oximetry on room air,we will rep-order it to see if you should be sleeping with oxygen.  You can check with your pharmacist to see if your insurance would prefer Stiolto, Anoro or Bevespi inhaler. We can prescribe whichever would be cheapest.  Please call if we can help

## 2020-12-27 ENCOUNTER — Encounter: Payer: Self-pay | Admitting: Internal Medicine

## 2020-12-27 NOTE — Assessment & Plan Note (Signed)
We will try again to assess sleep oxygenation

## 2020-12-29 ENCOUNTER — Telehealth: Payer: Self-pay | Admitting: Internal Medicine

## 2020-12-29 NOTE — Progress Notes (Signed)
  Chronic Care Management   Outreach Note  12/29/2020 Name: [name] MRN: [id number] DOB: 14-Nov-1950  Referred by: Hoyt Koch, MD Reason for referral : No chief complaint on file.   An unsuccessful telephone outreach was attempted today. The patient was referred to the pharmacist for assistance with care management and care coordination.   Follow Up Plan:   [place]

## 2021-01-02 ENCOUNTER — Ambulatory Visit: Payer: HMO | Admitting: Podiatry

## 2021-01-03 ENCOUNTER — Other Ambulatory Visit: Payer: Self-pay | Admitting: Internal Medicine

## 2021-01-04 ENCOUNTER — Other Ambulatory Visit: Payer: Self-pay | Admitting: Internal Medicine

## 2021-01-06 ENCOUNTER — Other Ambulatory Visit: Payer: Self-pay | Admitting: Cardiovascular Disease

## 2021-01-10 ENCOUNTER — Telehealth: Payer: Self-pay | Admitting: Internal Medicine

## 2021-01-10 NOTE — Progress Notes (Signed)
  Chronic Care Management   Outreach Note  01/10/2021 Name: [name] MRN: [id number] DOB: May 09, 1951  Referred by: Hoyt Koch, MD Reason for referral : No chief complaint on file.   A second unsuccessful telephone outreach was attempted today. The patient was referred to pharmacist for assistance with care management and care coordination.  Follow Up Plan:   Lauretta Grill [facility] Scheduler

## 2021-01-12 ENCOUNTER — Ambulatory Visit: Payer: HMO | Admitting: Neurology

## 2021-01-12 ENCOUNTER — Encounter: Payer: Self-pay | Admitting: Neurology

## 2021-01-12 VITALS — BP 110/58 | HR 69 | Ht 62.0 in | Wt 181.0 lb

## 2021-01-12 DIAGNOSIS — G2581 Restless legs syndrome: Secondary | ICD-10-CM | POA: Diagnosis not present

## 2021-01-12 DIAGNOSIS — I4811 Longstanding persistent atrial fibrillation: Secondary | ICD-10-CM | POA: Insufficient documentation

## 2021-01-12 DIAGNOSIS — I3139 Other pericardial effusion (noninflammatory): Secondary | ICD-10-CM

## 2021-01-12 DIAGNOSIS — I313 Pericardial effusion (noninflammatory): Secondary | ICD-10-CM

## 2021-01-12 DIAGNOSIS — I509 Heart failure, unspecified: Secondary | ICD-10-CM | POA: Insufficient documentation

## 2021-01-12 MED ORDER — ROPINIROLE HCL 1 MG PO TABS: ORAL_TABLET | ORAL | 5 refills | Status: DC

## 2021-01-12 NOTE — Patient Instructions (Signed)

## 2021-01-12 NOTE — Progress Notes (Signed)
SLEEP MEDICINE CLINIC   Provider:  Larey Seat, MD  Referring Provider: Hoyt Koch, * Primary Care Physician:  Hoyt Koch, MD  Chief Complaint  Patient presents with  . Follow-up    Pt alone, rm 10. Presents for yearly medication management f/u. Overall stable. Pt states that with the RLS the 2 mg ER was denied by insurance. She has continued taking the 0.5 mg tablet and then additional one if needed (was never ordered the 1 mg dose to take between dinner and bedtime). She has noted to have dizziness that are side effects to medications that she is taking but thankfully not fallen. When the weather was chg she c/o of HA's.     01-12-2021: RV : 31 -year-old Caucasian and Native American patient presents for yearly medication management f/u. Overall stable.  She had a severe decline by christmas- 2021- until March 2022. Pt states that with the RLS the 2 mg ER was denied by insurance. She has continued taking the 0.5 mg tablet and then additional one if needed (was never ordered the 1 mg dose to take between dinner and bedtime). She has noted dizziness and sleepiness that are possible side effects to medications, thankfully has not not fallen. Chronic respiratory failure. CHF. She has a history of pericardial effusion, had anemia- had transfusion, 3-31-2022_ferritin was 10.2 .  H and H was 10.4 g/ 32% - but walking was limited by swelling in feet and legs. Rectal bleeding. She is very easily SOB and her hips give out- has been limited in her walking ability. Dr Ellene Route sent her to PT for gait. Vit B 12 is low. GFR is low. Hyponatremia is present.  FSS 51 points, Epworth at 12/ 24 years, 4/ 15 depression scale.  Paxil may contribute to RLS and I would like to change to 10 mg for 7 days, then start Prozac. She will wait until dr Sharlet Salina speaks to her.   11-02-2019; [name] is a 70 year old Caucasian patient of Dr. Pricilla Holm, MD.  She presents today  after a 14-month hiatus.  The last time I saw her she had pericardial effusions, developed headaches dizziness and had fallen with a large hematoma to the right temple.  She states now that she has not  continued to-  fall to  fall out of bed. As she reports that there was a flurry of falls out of bed in June, July and August 2020.  She has a history of chronic atrial fibrillation.  She has regained fluid weight is abdominal distention.  She has a remote history of smoking over 25 years ago she quit.  She is tachycardic here today as she checked in and her blood pressure was elevated 152/90, pulse rate was 124.  She has been responding well to the treatment of restless legs with medication.  She endorsed the Epworth Sleepiness Scale at 6 out of 24 points which is a good response, she reports a high degree of fatigue. She is trying very day to walk a little more distance.  She will see Dr. Benn Moulder next month, blood work is next week.    03-05-2019,  [name] is a  70 year old caucasian left handed patient. Presenting for urgent Visit.  Interval history for anew concern of confusion, delayed word retrieval following a fall out of bed, head injury.  She had a headache , a little nausea and about a day later she developed headaches, dizziness and a large hematoma  on the right temple.   There has been a progression in her heart failure.  Her interval history is quite remarkable patient informed me in a MyChart message that she had been admitted to [facility] on May 31 after developing increasing shortness of breath and extreme weakness.  She was found to have pulmonary edema, atrial fibrillation and pericardial effusion was evaluated for underlying malignancy.  She had almost 2 L of fluid drained from the pericardium and lives she stated.  She was in the hospital for belly a week about the third week after she returned home she had a fall out of bed.  The CT of the chest abdomen and pelvis was performed  without contrast documented small amount of ascites primarily in the pelvis bilateral flank edema anasarca with bilateral pleural effusions, ascites and bilateral flank edema.  A previous CT had already described a similar small nodule to the left adrenal gland this has not changed and is likely incidental.  The previous CT was from 2003.  CT of the chest showed that the heart was mildly enlarged.there was still a small residual pericardial effusion and the mediastinum did not show enlarged lymph nodes there were pleural effusions small left more than right atelectasis in both lower lobes.  No infiltration, no pancreatic abnormality noted normal spleen, normal bowel anatomy.  The studies are from 14 January 2019. The discharge summary dated 16 January 2019 stated that the patient would be on fluid restriction she was discharged on torsemide, on Cipro, she will be followed for elevated liver function tests-transaminases, and her work-up for anemia will be continued.  At the time there was no neurologic concerns this arose from the fall 3 weeks later.     HPI: 06-10-2017, I had the opportunity of meeting [name] on February 28 last year, about 20 months ago. In the meantime she would like to be further evaluated a past medical history has not significantly changed. Her chief complaints of neither. She continues to be treated on Xarelto for atrial fibrillation with rapid ventricular response, takes Cardizem for rate control, gabapentin, estradiol, Invokana, Symbicort inhaler, Janumet, she still taking Paxil, but she is no longer taking Ultram.  She is still in need of a formal sleep evaluation, her atrial fibrillation has been difficult to control. She has failed 4 cardio-versions. Could not take Tikosyn as it interferes with her paxil.  insisted on continuing Paxil, out of fear to have panic attacks again. 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 She lives with her wife, has a  supportive sister. She adopted 2 children.      HPI:  [name] is a 70 y.o. female , seen here as a referral from Dr. Sharlet Salina for a sleep consultation,   Chief complaint according to patient : " insomnia- difficulties to go to sleep , but I can stay asleep" . 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  Mrs. Lavalle is a patient with atrial fibrillation and sleep initiation insomnia, she also had a Bell's palsy in 2001, a cholecystectomy in 2012 cardiac stenting in 2009 and a hysterectomy in 1982. Her past medical history includes hypertension, diabetes, hypercholesterolemia, heart disease atrial fibrillation, anxiety and migraine. In her review of systems she endorsed further easy bruising, shortness of breath, chest pain and palpitation being aware of a cardiac murmur, restless legs and headaches. It was upon further recommendation of her cardiologist at the atrial fibrillation clinic at [facility] where she  was recommended to undergo a sleep study. The patient was educated about the correlation between atrial fibrillation and obstructive sleep apnea. She sees Dr. Quay Burow. She has a panic disorder and takes Ativan.   Paroxysmal atrial fibrillation was first diagnosed in May 2015 with a successful conversion 2 first of May of the same year and after that she relapsed into a fib again in September of the same year. She then suffered in early 2017 recurrent episode and her cardiologist increased her metoprolol 250 minute times a day to control the rate. She underwent another cardioversion in feb 2017 ,  converted on the dose and has been maintaining sinus rhythm for one week.  Sleep habits are as follows: She takes Lorazepam at night and melatonin. She sleeps on her right side. She wakes up prone. Bedroom is cold, quiet and dark.  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 Working 2- 8 PM, late dinner, reading. And going to bed after midnight after  some " me time" . She sleeps 7-8 hours and often rises at noon. She sleeps with her spouse, she reportedly kicks her wife a lot.  She often wakes with palpitations and noted a very high blood pressure. She has woken diaphoretic.  Her spouse wakes up "a zillion times" but can go to sleep. Delayed sleep cycle for years . On weekends she sleeps 10-12 hours , 4 AM  until 2 PM. She does not nap in daytime. She does drink coffee after waking up and after work (!).  She wakes with a dry mouth and no headaches.  Sleep medical history and family sleep history: her sister has hypersomnia. Social history:  Married to same sex partner,  2 daughters 68 and 20. No ETOH, No tobacco, little  caffeine.    01-22-2018, I have the pleasure of seeing [name] and [name] today patient I had last encounter prior to scheduling a sleep study for her.  She has a history of migraine, persistent atrial fibrillation, coronary artery disease with cardiac stents, obesity, diabetes and hypertension.  She was referred by cardiologist Dr. Alvester Chou and primary care physician Pricilla Holm at the time.  Her sleep study revealed no significant apnea, mild snoring moderate snoring when supine, persistent atrial fibrillation, and severe periodic limb movements with related arousals at 14.6 times per hour of sleep.  I felt strongly that her PLM's are the main culprit also.  Periodic limb movements can be related to medication side effects but my main suspicion was that we need to check for her spinal anatomy, any neuropathy or any iron deficiency.  A ferritin level had to be obtained which was reportedly  very low and the patient was finally treated for iron deficiency and anemia.  In the meantime before the iron deficiency was evident she had seen Dr. Lynann Bologna,  who followed up with her recently  April 2019 and stated that she had no explanation for the progressive balance loss and pain in the extremities and could not correlate this quite to the  mild-spinal stenosis as found between L5 and S1.   The patient states that after she was transfused with 2 units her symptoms improved drastically but now are slowly coming back.  She is scheduled for an upper and lower GI endoscopy tomorrow.  This is meant to discover were blood may be lost. There is blood is in her stool. Dr Wallis Mart had seen her 3 years ago and warned  about Gi Bleed. Iron deficiency caused severe RLS, and now she can sit still for a while.  On Requip.    Review of Systems: Out of a complete 14 system review, the patient complains of only the following symptoms, and all other reviewed systems are negative.  Improved RLS, sleeps only 5-7 hours, and later in daytime.  She l has not slept some night.  FSS  29 down from 61, now up to 48 again  Epworth remained at  7 points.  Geriatric depression score 2   Social History   Socioeconomic History  . Marital status: Married    Spouse name: Not on file  . Number of children: 2  . Years of education: master's  . Highest education level: Not on file  Occupational History  . Occupation: Electrical engineer    Employer: [employer]  Tobacco Use  . Smoking status: Former Smoker    Packs/day: 1.50    Years: 30.00    Pack years: 45.00    Types: Cigarettes    Quit date: 08/13/1993    Years since quitting: 27.4  . Smokeless tobacco: Never Used  Vaping Use  . Vaping Use: Never used  Substance and Sexual Activity  . Alcohol use: No  . Drug use: No  . Sexual activity: Not on file  Other Topics Concern  . Not on file  Social History Narrative  . Not on file   Social Determinants of Health   Financial Resource Strain: Not on file  Food Insecurity: Not on file  Transportation Needs: Not on file  Physical Activity: Not on file  Stress: Not on file  Social Connections: Not on file  Intimate Partner Violence: Not on file    Family History  Problem Relation Age of Onset  . Heart attack Father 70       2nd MI  at 92  . Colon cancer Brother 31  . Gout Brother   . Ulcers Mother   . Emphysema Mother 62  . Colon polyps Sister   . Cancer Sister        Basal cell carcinoma  . Pneumonia Maternal Grandmother   . Hypertension Brother   . Hyperlipidemia Brother   . Cancer Brother        Skin  . Diabetes Neg Hx   . Stroke Neg Hx     Past Medical History:  Diagnosis Date  . Anemia, unspecified   . Arthritis   . Asthma   . Atrial fibrillation with RVR ([place])    a. on Xarelto  . Bell's palsy    Facial nerve decompression in 2001  . CHF (congestive heart failure) ([place])   . Chronic low back pain   . COPD with asthma ([place])   . Coronary artery disease    Myoview 04/12/11 was entirely normal. ECHO 02/26/08 showed only minor abnormalities. Stenting 05/26/08 of her posterolateral branch to the left circumflex coronary artery. Used a 2.5x31mm Taxus Monorail stent.myoview 2014 was without ischemia  . Diabetes mellitus    Type 2  . Early cataracts, bilateral   . Fatty liver   . GERD (gastroesophageal reflux disease)   . Glaucoma   . Glaucoma   . Goiter   . Heart murmur   . History of nuclear stress test 2012; 2014   lexiscan; normal pattern of perfusion; normal, low risk scan   . Hyperlipidemia   . Hypertension   . Panic disorder   . Pneumonia 2008  . Polycystic  ovary disease    Hysterectomy in 1982 for this  . Shortness of breath dyspnea    ECHO 02/26/08 showed only minor abnormalities  . Spinal stenosis     Past Surgical History:  Procedure Laterality Date  . ABDOMINAL HYSTERECTOMY  1982   & BSO; for polycystic ovary disease  . CARDIOVERSION N/A 12/17/2013   Procedure: CARDIOVERSION;  Surgeon: Pixie Casino, MD;  Location: [facility] ENDOSCOPY;  Service: Cardiovascular;  Laterality: N/A;  . CARDIOVERSION N/A 09/30/2015   Procedure: CARDIOVERSION;  Surgeon: Dorothy Spark, MD;  Location: [facility] ENDOSCOPY;  Service: Cardiovascular;  Laterality: N/A;  . CARDIOVERSION N/A 06/22/2016   Procedure:  CARDIOVERSION;  Surgeon: Skeet Latch, MD;  Location: [facility] ENDOSCOPY;  Service: Cardiovascular;  Laterality: N/A;  . CENTRAL LINE INSERTION  01/13/2019   Procedure: CENTRAL LINE INSERTION;  Surgeon: Jolaine Artist, MD;  Location: [place] CV LAB;  Service: Cardiovascular;;  . COLONOSCOPY     last 2009; Dr Cristina Gong; due 2019  . COLONOSCOPY WITH PROPOFOL N/A 01/23/2018   Procedure: COLONOSCOPY WITH PROPOFOL;  Surgeon: Ronald Lobo, MD;  Location: [place];  Service: Endoscopy;  Laterality: N/A;  . COLONOSCOPY WITH PROPOFOL N/A 11/04/2020   Procedure: COLONOSCOPY WITH PROPOFOL;  Surgeon: Ronald Lobo, MD;  Location: [place];  Service: Endoscopy;  Laterality: N/A;  . CORONARY ANGIOPLASTY  05/26/2008   Stenting of her posterolateral branch to the left circumflex coronary artery. Used a 2.5x31mm Taxus Monorail stent.  . ESOPHAGOGASTRODUODENOSCOPY (EGD) WITH PROPOFOL N/A 01/23/2018   Procedure: ESOPHAGOGASTRODUODENOSCOPY (EGD) WITH PROPOFOL;  Surgeon: Ronald Lobo, MD;  Location: [place];  Service: Endoscopy;  Laterality: N/A;  . ESOPHAGOGASTRODUODENOSCOPY (EGD) WITH PROPOFOL N/A 11/04/2020   Procedure: ESOPHAGOGASTRODUODENOSCOPY (EGD) WITH PROPOFOL;  Surgeon: Ronald Lobo, MD;  Location: [place];  Service: Endoscopy;  Laterality: N/A;  . FACIAL NERVE DECOMPRESSION  2001/2002   bells palsy   . GIVENS CAPSULE STUDY N/A 11/04/2020   Procedure: GIVENS CAPSULE STUDY;  Surgeon: Ronald Lobo, MD;  Location: [facility] ENDOSCOPY;  Service: Endoscopy;  Laterality: N/A;  . HOT HEMOSTASIS N/A 11/04/2020   Procedure: HOT HEMOSTASIS (ARGON PLASMA COAGULATION/BICAP);  Surgeon: Ronald Lobo, MD;  Location[facility] ENDOSCOPY;  Service: Endoscopy;  Laterality: N/A;  . LAPAROSCOPIC CHOLECYSTECTOMY  06/15/2011    Dr Dalbert Batman  . PERICARDIOCENTESIS N/A 01/13/2019   Procedure: PERICARDIOCENTESIS;  Surgeon: Burnell Blanks, MD;  Location: [place] CV LAB;  Service: Cardiovascular;   Laterality: N/A;  . POLYPECTOMY  11/04/2020   Procedure: POLYPECTOMY;  Surgeon: Ronald Lobo, MD;  Location: [place];  Service: Endoscopy;;  . RIGHT AND LEFT HEART CATH N/A 01/13/2019   Procedure: RIGHT AND LEFT HEART CATH;  Surgeon: Jolaine Artist, MD;  Location: [place] CV LAB;  Service: Cardiovascular;  Laterality: N/A;  . TEE WITHOUT CARDIOVERSION N/A 12/17/2013   Procedure: TRANSESOPHAGEAL ECHOCARDIOGRAM (TEE);  Surgeon: Pixie Casino, MD;  Location: [facility] ENDOSCOPY;  Service: Cardiovascular;  Laterality: N/A;  trish/ja  . TRANSTHORACIC ECHOCARDIOGRAM  3/55/7322   LV systolic function normal with mild conc LVH; LA mildly dilated; trace MR/TR  . UPPER GI ENDOSCOPY  2009   negative    Current Outpatient Medications  Medication Sig Dispense Refill  . ascorbic acid (VITAMIN C) 1000 MG tablet Take 1,000 mg by mouth daily.    . benzonatate (TESSALON) 200 MG capsule Take 200 mg by mouth as needed for cough.    [name] CALCIUM-MAG-VIT C-VIT D PO Take 1 tablet by mouth daily.    . canagliflozin (  INVOKANA) 100 MG TABS tablet Take 1 tablet (100 mg total) by mouth daily before breakfast. 90 tablet 3  . celecoxib (CELEBREX) 200 MG capsule Take 1 capsule (200 mg total) by mouth daily. 90 capsule 3  . colestipol (COLESTID) 1 g tablet Take 1 g by mouth as needed (stomach).    . diltiazem (CARDIZEM CD) 180 MG 24 hr capsule TAKE 1 CAPSULE DAILY. 90 capsule 3  . famotidine (PEPCID) 20 MG tablet Take 20 mg by mouth 2 (two) times daily.    [name] gabapentin (NEURONTIN) 100 MG capsule TAKE (1) CAPSULE THREE TIMES DAILY. 180 capsule 0  . guaiFENesin (MUCINEX) 600 MG 12 hr tablet Take 600 mg by mouth 2 (two) times daily as needed for cough.     [name] HYDROcodone-acetaminophen (NORCO/VICODIN) 5-325 MG tablet Take 0.5 tablets by mouth at bedtime. 15 tablet 0  . latanoprost (XALATAN) 0.005 % ophthalmic solution Place 1 drop into both eyes at bedtime.    [name] LORazepam (ATIVAN) 0.5 MG tablet TAKE 1 OR 2 TABLETS AT  BEDTIME. 60 tablet 5  . metoprolol succinate (TOPROL-XL) 100 MG 24 hr tablet TAKE ONE TABLET TWICE A DAY WITH OR IMMEDIATELY FOLLOWING A MEAL. (Patient taking differently: Take 100 mg by mouth 2 (two) times daily.) 180 tablet 1  . Multiple Minerals-Vitamins (CALCIUM CITRATE PLUS/MAGNESIUM PO) Take 1 tablet by mouth daily.    . Multiple Vitamins-Iron (MULTIVITAMINS WITH IRON) TABS Take 1 tablet by mouth daily.    [name] nystatin ointment (MYCOSTATIN) Apply 1 application topically 2 (two) times daily as needed (IRRITATION).   0  . omeprazole (PRILOSEC) 20 MG capsule Take 1 capsule (20 mg total) by mouth 2 (two) times daily before a meal.    . ONETOUCH DELICA LANCETS FINE MISC 1 Units by Does not apply route as directed. 100 each 3  . PARoxetine (PAXIL) 10 MG tablet TAKE 2 AND 1/2 TABS DAILY 225 tablet 0  . potassium chloride (MICRO-K) 10 MEQ CR capsule Take 1 capsule (10 mEq total) by mouth 2 (two) times daily. 180 capsule 2  . quinapril (ACCUPRIL) 40 MG tablet TAKE 1 TABLET ONCE DAILY. (Patient taking differently: Take 40 mg by mouth at bedtime.) 90 tablet 1  . rivaroxaban (XARELTO) 20 MG TABS tablet Take 1 tablet (20 mg total) by mouth daily with supper. Restart only on Sunday 90 tablet 3  . rOPINIRole (REQUIP) 0.5 MG tablet TAKE 1 TO 2 TABLETS AT MIDNIGHT. (Patient taking differently: Take 0.5-1 mg by mouth at bedtime.) 60 tablet 0  . rosuvastatin (CRESTOR) 10 MG tablet TAKE ONE TABLET AT BEDTIME. 90 tablet 3  . senna (SENOKOT) 8.6 MG TABS Take 1 tablet by mouth at bedtime as needed for mild constipation.    . sitaGLIPtin-metformin (JANUMET) 50-1000 MG tablet Take 1 tablet by mouth 2 (two) times daily with a meal. TAKE 1/2 TABLET IN THE MORNING WITH BREAKFAST AND 1 TABLET IN THE EVENING WITH SUPPER. (Patient taking differently: Take 0.5-1 tablets by mouth See admin instructions. TAKE 1/2 TABLET IN THE MORNING WITH BREAKFAST AND 1 TABLET IN THE EVENING WITH SUPPER.) 45 tablet 7  . torsemide (DEMADEX) 20 MG  tablet TAKE (2) TABLETS TWICE DAILY. 360 tablet 0  . traZODone (DESYREL) 100 MG tablet TAKE 1/2 TO 1 TABLET AT BEDTIME. (Patient taking differently: Take 100 mg by mouth at bedtime.) 90 tablet 3  . umeclidinium-vilanterol (ANORO ELLIPTA) 62.5-25 MCG/INH AEPB Inhale 1 puff into the lungs daily. 60 each 12   No current facility-administered  medications for this visit.    Allergies as of 01/12/2021 - Review Complete 01/12/2021  Allergen Reaction Noted  . Benadryl [diphenhydramine hcl] Other (See Comments) 06/15/2011  . Clopidogrel bisulfate Nausea Only   . Contrast media [iodinated diagnostic agents] Palpitations 06/15/2011    Vitals: BP (!) 110/58   Pulse 69   Ht 5\' 2"  (1.575 m)   Wt 181 lb (82.1 kg)   BMI 33.11 kg/m  Last Weight:  Wt Readings from Last 1 Encounters:  01/12/21 181 lb (82.1 kg)   QJF:HLKT mass index is 33.11 kg/m.     Last Height:   Ht Readings from Last 1 Encounters:  01/12/21 5\' 2"  (1.575 m)    Physical exam:  General: The patient is awake, alert and appears not in acute distress. The patient is obese. Head: Normocephalic, atraumatic. Neck is supple. Mallampati 3  neck circumference:16.25 . Nasal airflow intact , TMJ is evident . Retrognathia is seen.   Cardiovascular:  irregular rate and rhythm Respiratory: Lungs are clear to auscultation. Minimal excursion.  Skin:  Without evidence of edema, or rash Trunk: BMI is elevated . Neurologic exam : The patient is awake and alert, oriented to place and time.   She appears jaundiced- .  Memory subjective  described as intact.  Attention span & concentration ability appears normal.  Montreal Cognitive Assessment  03/05/2019  Visuospatial/ Executive (0/5) 4  Naming (0/3) 3  Attention: Read list of digits (0/2) 2  Attention: Read list of letters (0/1) 1  Attention: Serial 7 subtraction starting at 100 (0/3) 3  Language: Repeat phrase (0/2) 2  Language : Fluency (0/1) 1  Abstraction (0/2) 2  Delayed Recall  (0/5) 5  Orientation (0/6) 6  Total 29   MOCA 29/30    Speech is fluent, without dysarthria, mild dysphonia. Mood and affect are appropriate.  Cranial nerves: Pupils are equal and briskly reactive to light. Funduscopic exam deferred. Extraocular movements in vertical and horizontal planes intact and without nystagmus. Visual fields by finger perimetry are intact.Hearing to finger rub intact.  Facial sensation intact to fine touch. Facial motor strength is symmetric and tongue and uvula move midline.  Shoulder shrug was symmetrical.  Motor exam:  Normal tone, muscle bulk and symmetric strength in all extremities. Sensory:  Fine touch, pinprick and vibration in the upper extremities was normal. Coordination: Finger-to-nose maneuver  without evidence of ataxia, dysmetria or tremor. Gait and station: Patient walks without assistive device. Strength within normal limits.Stance is stable and normal.Tandem gait is unfragmented. Turns with 3 Steps.  Deep tendon reflexes: in the upper and lower extremities are symmetric and intact. Babinski maneuver response is downgoing.  We are ordering a CT and repeated  MMSE/ MOCA.   Patient is vaccinated for Covid . Started back at her office a 2 days a week.    Assessment:  After physical and neurologic examination, review of laboratory studies,  Personal review of imaging studies, reports of other /same  Imaging studies ,  Results of polysomnography/ neurophysiology testing and pre-existing records as far as provided in visit., my assessment is :  0) PLMs, RLS -  Can be partly due to spinal stenosis, but she responded well to Requip and transfusions.  Refilled Requip. Increased to 1 mg up to 3 a day.   1) Her memory and word retrieval is continuesly improving.   2) recurrent anemia, recurrent blood loss, and CHF, CKD,  pericardial effusion - No malignancy found. SOB,  Her CHF related to  Atrial fibrillation, persistent  -She cannot undergo ablation while in  almost constant atrial fibrillation- and her a fib has taken her energy away, her stamina.She just regained a lot of fluid.     Plan:  Treatment plan and additional workup : Requip refilled and increased, this helps RLS.      Asencion Partridge Alania Overholt MD  01/12/2021   CC: Hoyt Koch, [address] [place],  [place] [address]

## 2021-01-16 ENCOUNTER — Ambulatory Visit (INDEPENDENT_AMBULATORY_CARE_PROVIDER_SITE_OTHER): Payer: HMO | Admitting: Podiatry

## 2021-01-16 ENCOUNTER — Other Ambulatory Visit: Payer: Self-pay

## 2021-01-16 ENCOUNTER — Encounter: Payer: Self-pay | Admitting: Internal Medicine

## 2021-01-16 DIAGNOSIS — M722 Plantar fascial fibromatosis: Secondary | ICD-10-CM

## 2021-01-16 DIAGNOSIS — M778 Other enthesopathies, not elsewhere classified: Secondary | ICD-10-CM

## 2021-01-16 NOTE — Patient Instructions (Signed)

## 2021-01-17 ENCOUNTER — Encounter: Payer: Self-pay | Admitting: Internal Medicine

## 2021-01-17 ENCOUNTER — Other Ambulatory Visit: Payer: Self-pay

## 2021-01-17 MED ORDER — FREESTYLE LITE TEST VI STRP: ORAL_STRIP | 12 refills | Status: DC

## 2021-01-18 NOTE — Progress Notes (Signed)
Subjective:   Patient ID: [name], female   DOB: 70 y.o.   MRN: [id number]   HPI Patient states the tops of both feet are starting to hurt her again and she did get approximate 5 months relief and she is getting quite a bit of pain in the left heel with inflammation fluid buildup   ROS      Objective:  Physical Exam  Neurovascular status intact with extensor tendinitis noted bilateral midfoot with inflammation fluid around the extensor complex with moderate discomfort in the plantar fascial left its been present for a period of time of several months     Assessment:  Acute extensor tendinitis bilateral along with plantar fasciitis left     Plan:  Reviewed both conditions discussing and I recommended for the heel shoe gear modification stretching exercises which were given to her today and anti-inflammatories.  I did do sterile prep injected the extensor tendon complex bilateral 3 mg Kenalog 5 mg Xylocaine which was tolerated well

## 2021-01-20 ENCOUNTER — Ambulatory Visit: Payer: HMO | Admitting: Podiatry

## 2021-01-20 ENCOUNTER — Encounter: Payer: Self-pay | Admitting: Podiatry

## 2021-01-20 ENCOUNTER — Other Ambulatory Visit: Payer: Self-pay

## 2021-01-20 DIAGNOSIS — M79675 Pain in left toe(s): Secondary | ICD-10-CM | POA: Diagnosis not present

## 2021-01-20 DIAGNOSIS — K625 Hemorrhage of anus and rectum: Secondary | ICD-10-CM | POA: Insufficient documentation

## 2021-01-20 DIAGNOSIS — E1142 Type 2 diabetes mellitus with diabetic polyneuropathy: Secondary | ICD-10-CM | POA: Diagnosis not present

## 2021-01-20 DIAGNOSIS — B351 Tinea unguium: Secondary | ICD-10-CM | POA: Diagnosis not present

## 2021-01-20 DIAGNOSIS — M79674 Pain in right toe(s): Secondary | ICD-10-CM

## 2021-01-20 DIAGNOSIS — Q2733 Arteriovenous malformation of digestive system vessel: Secondary | ICD-10-CM | POA: Insufficient documentation

## 2021-01-23 ENCOUNTER — Encounter: Payer: Self-pay | Admitting: Internal Medicine

## 2021-01-23 ENCOUNTER — Other Ambulatory Visit: Payer: Self-pay

## 2021-01-23 ENCOUNTER — Ambulatory Visit (INDEPENDENT_AMBULATORY_CARE_PROVIDER_SITE_OTHER): Payer: HMO | Admitting: Internal Medicine

## 2021-01-23 VITALS — BP 120/60 | HR 85 | Temp 97.8°F | Ht 62.0 in | Wt 182.6 lb

## 2021-01-23 DIAGNOSIS — E1142 Type 2 diabetes mellitus with diabetic polyneuropathy: Secondary | ICD-10-CM

## 2021-01-23 DIAGNOSIS — D500 Iron deficiency anemia secondary to blood loss (chronic): Secondary | ICD-10-CM

## 2021-01-23 DIAGNOSIS — E1159 Type 2 diabetes mellitus with other circulatory complications: Secondary | ICD-10-CM | POA: Diagnosis not present

## 2021-01-23 DIAGNOSIS — I5032 Chronic diastolic (congestive) heart failure: Secondary | ICD-10-CM | POA: Diagnosis not present

## 2021-01-23 MED ORDER — JANUMET 50-1000 MG PO TABS: 1.0000 | ORAL_TABLET | Freq: Two times a day (BID) | ORAL | 3 refills | Status: DC

## 2021-01-23 NOTE — Progress Notes (Signed)
   Subjective:   Patient ID: [name], female    DOB: 1951/02/24, 70 y.o.   MRN: [id number]  HPI The patient is a 70 YO female coming in for follow up anemia (still feels tired but stable, denies new chest pains or SOB) and CHF (some SOB which is stable overall, able to walk around house without getting winded and stopping, no change to breathing with lying down, denies significant change in weight) and diabetes (hard for her to split the janumet pills so has been taking 1 a day or 2 a day and running out early due to being written for 1/2 qam and 1 q pm, denies new numbness, denies low or high sugars, some morning 140s).   Review of Systems  Constitutional:  Positive for activity change and fatigue.  HENT: Negative.    Eyes: Negative.   Respiratory:  Positive for shortness of breath. Negative for cough and chest tightness.   Cardiovascular:  Negative for chest pain, palpitations and leg swelling.  Gastrointestinal:  Negative for abdominal distention, abdominal pain, constipation, diarrhea, nausea and vomiting.  Musculoskeletal: Negative.   Skin: Negative.   Neurological: Negative.   Psychiatric/Behavioral: Negative.     Objective:  Physical Exam Constitutional:      Appearance: She is well-developed. She is obese.  HENT:     Head: Normocephalic and atraumatic.  Cardiovascular:     Rate and Rhythm: Normal rate and regular rhythm.  Pulmonary:     Effort: Pulmonary effort is normal. No respiratory distress.     Breath sounds: Normal breath sounds. No wheezing or rales.  Abdominal:     General: Bowel sounds are normal. There is no distension.     Palpations: Abdomen is soft.     Tenderness: There is no abdominal tenderness. There is no rebound.  Musculoskeletal:     Cervical back: Normal range of motion.  Skin:    General: Skin is warm and dry.  Neurological:     Mental Status: She is alert and oriented to person, place, and time.     Coordination: Coordination normal.     Vitals:   01/23/21 1614  BP: 120/60  Pulse: 85  Temp: 97.8 F (36.6 C)  TempSrc: Oral  SpO2: 95%  Weight: 182 lb 9.6 oz (82.8 kg)  Height: 5\' 2"  (1.575 m)    This visit occurred during the SARS-CoV-2 public health emergency.  Safety protocols were in place, including screening questions prior to the visit, additional usage of staff PPE, and extensive cleaning of exam room while observing appropriate contact time as indicated for disinfecting solutions.   Assessment & Plan:

## 2021-01-23 NOTE — Patient Instructions (Addendum)
We will check the labs today and have sent in the new directions on the janumet

## 2021-01-25 NOTE — Progress Notes (Signed)
Subjective: [name] is a pleasant 70 y.o. female patient seen for at risk diabetic foot care with h/o diabetic neuropathy on today. She presents with painful thick toenails that are difficult to trim. Pain interferes with ambulation. Aggravating factors include wearing enclosed shoe gear. Pain is relieved with periodic professional debridement.  Patient states she got her right 5th toe snagged on a sock and it's a little sore.  PCP is Hoyt Koch, MD. Last visit was: 01/23/2021.  Allergies  Allergen Reactions   Benadryl [Diphenhydramine Hcl] Other (See Comments)    Restless leg   Clopidogrel Bisulfate Nausea Only    Nausea & pain   Clopidogrel Bisulfate     Other reaction(s): stomach upset   Diphenhydramine     Other reaction(s): rapid heartbeat   Diphenhydramine Hcl    Ioversol    Iodinated Diagnostic Agents Palpitations    Rapid heart rate, hot Other reaction(s): rapid heartbeat    Objective: Physical Exam  General: [name] is a pleasant 70 y.o. Caucasian female, in NAD. AAO x 3.   Vascular:  Capillary fill time to digits <3 seconds b/l lower extremities. Palpable DP pulse(s) b/l lower extremities Palpable PT pulse(s) b/l lower extremities Pedal hair sparse. Lower extremity skin temperature gradient within normal limits. No pain with calf compression b/l. Varicosities present b/l.  Dermatological:  Pedal skin with normal turgor, texture and tone bilaterally. No open wounds bilaterally. No interdigital macerations bilaterally. Toenails 2-5 bilaterally elongated, discolored, dystrophic, thickened, and crumbly with subungual debris and tenderness to dorsal palpation. Anonychia noted L hallux and R hallux. Nailbed(s) epithelialized.   Musculoskeletal:  Normal muscle strength 5/5 to all lower extremity muscle groups bilaterally. No pain crepitus or joint limitation noted with ROM b/l. Hallux valgus with bunion deformity noted b/l lower extremities. Pes planus  deformity noted b/l.   Neurological:  Protective sensation intact 5/5 intact bilaterally with 10g monofilament b/l. Vibratory sensation intact b/l.  Assessment and Plan:  1. Pain due to onychomycosis of toenails of both feet   2. Diabetic polyneuropathy associated with type 2 diabetes mellitus ([place])     -Examined patient. -Continue diabetic foot care principles. -Patient to continue soft, supportive shoe gear daily. -Toenails 2-5 bilaterally debrided in length and girth without iatrogenic bleeding with sterile nail nipper and dremel.  -Patient to report any pedal injuries to medical professional immediately. -Patient/POA to call should there be question/concern in the interim.  Return in about 3 months (around 04/22/2021).  Marzetta Board, DPM

## 2021-01-26 ENCOUNTER — Other Ambulatory Visit (INDEPENDENT_AMBULATORY_CARE_PROVIDER_SITE_OTHER): Payer: HMO

## 2021-01-26 ENCOUNTER — Other Ambulatory Visit: Payer: Self-pay

## 2021-01-26 ENCOUNTER — Telehealth: Payer: Self-pay

## 2021-01-26 DIAGNOSIS — E1159 Type 2 diabetes mellitus with other circulatory complications: Secondary | ICD-10-CM

## 2021-01-26 DIAGNOSIS — D500 Iron deficiency anemia secondary to blood loss (chronic): Secondary | ICD-10-CM

## 2021-01-26 DIAGNOSIS — I5032 Chronic diastolic (congestive) heart failure: Secondary | ICD-10-CM | POA: Diagnosis not present

## 2021-01-26 LAB — CBC
HCT: 33.7 % — ABNORMAL LOW (ref 36.0–46.0)
Hemoglobin: 11.7 g/dL — ABNORMAL LOW (ref 12.0–15.0)
MCHC: 34.6 g/dL (ref 30.0–36.0)
MCV: 84.8 fl (ref 78.0–100.0)
Platelets: 258 10*3/uL (ref 150.0–400.0)
RBC: 3.97 Mil/uL (ref 3.87–5.11)
RDW: 15 % (ref 11.5–15.5)
WBC: 8.1 10*3/uL (ref 4.0–10.5)

## 2021-01-26 LAB — COMPREHENSIVE METABOLIC PANEL
ALT: 24 U/L (ref 0–35)
AST: 44 U/L — ABNORMAL HIGH (ref 0–37)
Albumin: 4.2 g/dL (ref 3.5–5.2)
Alkaline Phosphatase: 57 U/L (ref 39–117)
BUN: 32 mg/dL — ABNORMAL HIGH (ref 6–23)
CO2: 29 mEq/L (ref 19–32)
Calcium: 9.1 mg/dL (ref 8.4–10.5)
Chloride: 85 mEq/L — ABNORMAL LOW (ref 96–112)
Creatinine, Ser: 1.25 mg/dL — ABNORMAL HIGH (ref 0.40–1.20)
GFR: 43.13 mL/min — ABNORMAL LOW (ref >60.00)
Glucose, Bld: 105 mg/dL — ABNORMAL HIGH (ref 70–99)
Potassium: 4.5 mEq/L (ref 3.5–5.1)
Sodium: 120 mEq/L — CL (ref 135–145)
Total Bilirubin: 0.4 mg/dL (ref 0.2–1.2)
Total Protein: 7.4 g/dL (ref 6.0–8.3)

## 2021-01-26 LAB — BRAIN NATRIURETIC PEPTIDE: Pro B Natriuretic peptide (BNP): 161 pg/mL — ABNORMAL HIGH (ref 0.0–100.0)

## 2021-01-26 LAB — HEMOGLOBIN A1C: Hgb A1c MFr Bld: 7.7 % — ABNORMAL HIGH (ref 4.6–6.5)

## 2021-01-26 LAB — FERRITIN: Ferritin: 37.3 ng/mL (ref 10.0–291.0)

## 2021-01-26 NOTE — Assessment & Plan Note (Signed)
Stable with gabapentin 100 mg TID.

## 2021-01-26 NOTE — Assessment & Plan Note (Signed)
Checking BNP and adjust torsemide dosing as needed. She is clinically euvolemic on exam today.

## 2021-01-26 NOTE — Telephone Encounter (Signed)
CRITICAL VALUE STICKER  CRITICAL VALUE: 120 Sodium   RECEIVER (on-site recipient of call): Lovena Le A. Alroy Dust, CMA  DATE & TIME NOTIFIED: 01/26/21 at 4:52p  MESSENGER (representative from lab): Hope  MD NOTIFIED: yes  TIME OF NOTIFICATION: 5:53p  RESPONSE: waiting on response

## 2021-01-26 NOTE — Assessment & Plan Note (Signed)
Checking CBC and ferritin. No dark stools, taking xarelto. No new clinical symptoms of anemia.

## 2021-01-26 NOTE — Assessment & Plan Note (Signed)
Checking HgA1c and adjust as needed. Due to size of tablets and inability to split pills will change janumet to 50/1000 mg twice a day. She will also remain on invokana 100 mg daily. On statin and ACE-I.

## 2021-01-27 ENCOUNTER — Encounter: Payer: Self-pay | Admitting: Internal Medicine

## 2021-01-27 NOTE — Telephone Encounter (Signed)
Addressed via result notes ?

## 2021-01-29 ENCOUNTER — Encounter: Payer: Self-pay | Admitting: Neurology

## 2021-01-30 NOTE — Telephone Encounter (Signed)
Please call patient today and ask if weakness persists. If so needs to go to ER due to low sodium level.

## 2021-02-03 ENCOUNTER — Encounter: Payer: Self-pay | Admitting: Internal Medicine

## 2021-02-03 MED ORDER — HYDROCODONE-ACETAMINOPHEN 5-325 MG PO TABS: 0.5000 | ORAL_TABLET | Freq: Every day | ORAL | 0 refills | Status: DC

## 2021-02-06 DIAGNOSIS — Q2733 Arteriovenous malformation of digestive system vessel: Secondary | ICD-10-CM | POA: Diagnosis not present

## 2021-02-06 DIAGNOSIS — Z7901 Long term (current) use of anticoagulants: Secondary | ICD-10-CM | POA: Diagnosis not present

## 2021-02-06 DIAGNOSIS — D500 Iron deficiency anemia secondary to blood loss (chronic): Secondary | ICD-10-CM | POA: Diagnosis not present

## 2021-02-06 DIAGNOSIS — R198 Other specified symptoms and signs involving the digestive system and abdomen: Secondary | ICD-10-CM | POA: Diagnosis not present

## 2021-02-08 ENCOUNTER — Telehealth: Payer: Self-pay | Admitting: Internal Medicine

## 2021-02-08 NOTE — Chronic Care Management (AMB) (Signed)
  Chronic Care Management   Outreach Note  02/08/2021 Name: CYRA SPADER MRN: [id number] DOB: Dec 27, 1950  Referred by: Hoyt Koch, MD Reason for referral : No chief complaint on file.   Third unsuccessful telephone outreach was attempted today. The patient was referred to the pharmacist for assistance with care management and care coordination.   Follow Up Plan:   Lauretta Grill [employer] Scheduler

## 2021-02-09 ENCOUNTER — Telehealth: Payer: Self-pay | Admitting: Internal Medicine

## 2021-02-09 NOTE — Telephone Encounter (Signed)
See below

## 2021-02-09 NOTE — Telephone Encounter (Signed)
I had recommended ER for evaluation for admission given her sodium and symptoms. She did not elect to do that. I would recommend visit to assess clinical status rather than just repeating labs.

## 2021-02-09 NOTE — Telephone Encounter (Signed)
Patient's cardiologist is requesting that her sodium levels be rechecked. Patient states she has been feeling weak but declined ED.   Please advise.

## 2021-02-10 NOTE — Telephone Encounter (Signed)
Patient has been scheduled fof July 11 at 4:00 pm

## 2021-02-10 NOTE — Telephone Encounter (Signed)
   Patient called and is requesting lab orders be placed to check her sodium levels. She is requesting a call back. She can be reached at [phone]

## 2021-02-15 ENCOUNTER — Telehealth: Payer: Self-pay | Admitting: Internal Medicine

## 2021-02-15 NOTE — Chronic Care Management (AMB) (Signed)
  Chronic Care Management   Outreach Note  02/15/2021 Name: [name] MRN: [id number] DOB: 02/20/1951  Referred by: Hoyt Koch, MD Reason for referral : No chief complaint on file.   Third unsuccessful telephone outreach was attempted today. The patient was referred to the pharmacist for assistance with care management and care coordination.   Follow Up Plan:   Lauretta Grill Upstream Scheduler

## 2021-02-20 ENCOUNTER — Other Ambulatory Visit: Payer: Self-pay

## 2021-02-20 ENCOUNTER — Encounter: Payer: Self-pay | Admitting: Internal Medicine

## 2021-02-20 ENCOUNTER — Ambulatory Visit (INDEPENDENT_AMBULATORY_CARE_PROVIDER_SITE_OTHER): Payer: HMO | Admitting: Internal Medicine

## 2021-02-20 VITALS — BP 130/80 | HR 51 | Temp 97.8°F | Resp 18 | Ht 62.0 in | Wt 182.4 lb

## 2021-02-20 DIAGNOSIS — E871 Hypo-osmolality and hyponatremia: Secondary | ICD-10-CM

## 2021-02-20 DIAGNOSIS — M545 Low back pain, unspecified: Secondary | ICD-10-CM | POA: Diagnosis not present

## 2021-02-20 DIAGNOSIS — I5032 Chronic diastolic (congestive) heart failure: Secondary | ICD-10-CM

## 2021-02-20 DIAGNOSIS — M5416 Radiculopathy, lumbar region: Secondary | ICD-10-CM | POA: Diagnosis not present

## 2021-02-20 DIAGNOSIS — D500 Iron deficiency anemia secondary to blood loss (chronic): Secondary | ICD-10-CM | POA: Diagnosis not present

## 2021-02-20 LAB — CBC
HCT: 31.5 % — ABNORMAL LOW (ref 36.0–46.0)
Hemoglobin: 10.8 g/dL — ABNORMAL LOW (ref 12.0–15.0)
MCHC: 34.3 g/dL (ref 30.0–36.0)
MCV: 86.8 fl (ref 78.0–100.0)
Platelets: 235 10*3/uL (ref 150.0–400.0)
RBC: 3.63 Mil/uL — ABNORMAL LOW (ref 3.87–5.11)
RDW: 13.3 % (ref 11.5–15.5)
WBC: 6.6 10*3/uL (ref 4.0–10.5)

## 2021-02-20 LAB — COMPREHENSIVE METABOLIC PANEL
ALT: 14 U/L (ref 0–35)
AST: 23 U/L (ref 0–37)
Albumin: 4.4 g/dL (ref 3.5–5.2)
Alkaline Phosphatase: 58 U/L (ref 39–117)
BUN: 23 mg/dL (ref 6–23)
CO2: 27 mEq/L (ref 19–32)
Calcium: 9.2 mg/dL (ref 8.4–10.5)
Chloride: 89 mEq/L — ABNORMAL LOW (ref 96–112)
Creatinine, Ser: 1.31 mg/dL — ABNORMAL HIGH (ref 0.40–1.20)
GFR: 40.76 mL/min — ABNORMAL LOW (ref >60.00)
Glucose, Bld: 121 mg/dL — ABNORMAL HIGH (ref 70–99)
Potassium: 4.9 mEq/L (ref 3.5–5.1)
Sodium: 125 mEq/L — ABNORMAL LOW (ref 135–145)
Total Bilirubin: 0.6 mg/dL (ref 0.2–1.2)
Total Protein: 7.2 g/dL (ref 6.0–8.3)

## 2021-02-20 LAB — TSH: TSH: 2.49 u[IU]/mL (ref 0.35–5.50)

## 2021-02-20 LAB — BRAIN NATRIURETIC PEPTIDE: Pro B Natriuretic peptide (BNP): 163 pg/mL — ABNORMAL HIGH (ref 0.0–100.0)

## 2021-02-20 LAB — T4, FREE: Free T4: 1.08 ng/dL (ref 0.60–1.60)

## 2021-02-20 NOTE — Patient Instructions (Addendum)
Work on limiting fluids to 60 oz daily to see if we can stabilize the fluid levels and the sodium levels.   We will call you back about the labs.

## 2021-02-20 NOTE — Progress Notes (Signed)
   Subjective:   Patient ID: [name], female    DOB: 04-11-1951, 70 y.o.   MRN: [id number]  HPI The patient is a 70 YO female coming in for follow up of low sodium levels. Previously was 120 and advised to go to ER which she did not do about a month ago. No follow up of this since. Denies confusion but feels a little sluggish and maybe some muscle cramps. Does drink a lot of water and ice. Denies chest pains or SOB change. Does feel that her weight is up over the last few months and in the stomach region. Legs are not swelling much.  Review of Systems  Constitutional:  Positive for activity change and fatigue.  HENT: Negative.    Eyes: Negative.   Respiratory:  Positive for shortness of breath. Negative for cough and chest tightness.        Chronic and stable  Cardiovascular:  Negative for chest pain, palpitations and leg swelling.  Gastrointestinal:  Positive for abdominal distention. Negative for abdominal pain, constipation, diarrhea, nausea and vomiting.  Musculoskeletal: Negative.   Skin: Negative.   Neurological: Negative.   Psychiatric/Behavioral: Negative.     Objective:  Physical Exam Constitutional:      Appearance: She is well-developed.  HENT:     Head: Normocephalic and atraumatic.  Cardiovascular:     Rate and Rhythm: Normal rate and regular rhythm.  Pulmonary:     Effort: Pulmonary effort is normal. No respiratory distress.     Breath sounds: Normal breath sounds. No wheezing or rales.  Abdominal:     General: Bowel sounds are normal. There is distension.     Palpations: Abdomen is soft.     Tenderness: There is no abdominal tenderness. There is no rebound.  Musculoskeletal:     Cervical back: Normal range of motion.     Comments: 1+ pitting edema to the shins bilaterally  Skin:    General: Skin is warm and dry.  Neurological:     Mental Status: She is alert and oriented to person, place, and time.     Coordination: Coordination normal.    Vitals:    02/20/21 1606  BP: 130/80  Pulse: (!) 51  Resp: 18  Temp: 97.8 F (36.6 C)  TempSrc: Oral  SpO2: 95%  Weight: 182 lb 6.4 oz (82.7 kg)  Height: 5\' 2"  (1.575 m)    This visit occurred during the SARS-CoV-2 public health emergency.  Safety protocols were in place, including screening questions prior to the visit, additional usage of staff PPE, and extensive cleaning of exam room while observing appropriate contact time as indicated for disinfecting solutions.   Assessment & Plan:

## 2021-02-21 ENCOUNTER — Encounter: Payer: Self-pay | Admitting: Internal Medicine

## 2021-02-21 NOTE — Assessment & Plan Note (Signed)
Checking sodium level. We talked about combination of the heart failure, high water intake, and renal function combining to likely cause her hyponatremia. We talked about serious risk of brain edema and changes with severely low levels. She will limit fluids to 60 oz daily to see if this helps. Checking TSH and free T4 as not checked recently. Adrenal function checked within the past 3 months. She has had waxing and waning fairly severe hyponatremia since 2019 or 2020 when she had fluid around her heart. Follow up in 3 months if sodium level stable, if dangerously low we talked about need to possibly go to hospital for medically supervised fluids to help raise the level slowly.

## 2021-02-21 NOTE — Assessment & Plan Note (Signed)
Checking CBC as this can impact her SOB and fatigue overall. Adjust as needed.

## 2021-02-21 NOTE — Assessment & Plan Note (Signed)
She does not look volume overloaded today. There is some abdominal girth and not clearly with fluid wave. Checking CMP and BNP today. Last echo 2021.

## 2021-02-24 ENCOUNTER — Other Ambulatory Visit: Payer: Self-pay | Admitting: Cardiovascular Disease

## 2021-02-24 ENCOUNTER — Other Ambulatory Visit: Payer: Self-pay | Admitting: Internal Medicine

## 2021-02-28 ENCOUNTER — Emergency Department (HOSPITAL_COMMUNITY): Payer: HMO

## 2021-02-28 ENCOUNTER — Inpatient Hospital Stay (HOSPITAL_COMMUNITY)
Admission: EM | Admit: 2021-02-28 | Discharge: 2021-03-07 | DRG: 562 | Disposition: A | Discharge status: Skilled Nursing Facility | Payer: HMO | Attending: Internal Medicine | Admitting: Internal Medicine

## 2021-02-28 ENCOUNTER — Other Ambulatory Visit: Payer: Self-pay

## 2021-02-28 DIAGNOSIS — E871 Hypo-osmolality and hyponatremia: Secondary | ICD-10-CM | POA: Diagnosis present

## 2021-02-28 DIAGNOSIS — S32455A Nondisplaced transverse fracture of left acetabulum, initial encounter for closed fracture: Secondary | ICD-10-CM | POA: Diagnosis not present

## 2021-02-28 DIAGNOSIS — S42202D Unspecified fracture of upper end of left humerus, subsequent encounter for fracture with routine healing: Secondary | ICD-10-CM | POA: Diagnosis not present

## 2021-02-28 DIAGNOSIS — Z79891 Long term (current) use of opiate analgesic: Secondary | ICD-10-CM

## 2021-02-28 DIAGNOSIS — S42212A Unspecified displaced fracture of surgical neck of left humerus, initial encounter for closed fracture: Secondary | ICD-10-CM | POA: Diagnosis not present

## 2021-02-28 DIAGNOSIS — E1165 Type 2 diabetes mellitus with hyperglycemia: Secondary | ICD-10-CM | POA: Diagnosis not present

## 2021-02-28 DIAGNOSIS — J449 Chronic obstructive pulmonary disease, unspecified: Secondary | ICD-10-CM | POA: Diagnosis not present

## 2021-02-28 DIAGNOSIS — S42202A Unspecified fracture of upper end of left humerus, initial encounter for closed fracture: Secondary | ICD-10-CM | POA: Diagnosis not present

## 2021-02-28 DIAGNOSIS — W06XXXA Fall from bed, initial encounter: Secondary | ICD-10-CM | POA: Diagnosis present

## 2021-02-28 DIAGNOSIS — S32422A Displaced fracture of posterior wall of left acetabulum, initial encounter for closed fracture: Secondary | ICD-10-CM | POA: Diagnosis not present

## 2021-02-28 DIAGNOSIS — N179 Acute kidney failure, unspecified: Secondary | ICD-10-CM | POA: Diagnosis not present

## 2021-02-28 DIAGNOSIS — J984 Other disorders of lung: Secondary | ICD-10-CM | POA: Diagnosis not present

## 2021-02-28 DIAGNOSIS — M25519 Pain in unspecified shoulder: Secondary | ICD-10-CM

## 2021-02-28 DIAGNOSIS — R531 Weakness: Secondary | ICD-10-CM | POA: Diagnosis not present

## 2021-02-28 DIAGNOSIS — E878 Other disorders of electrolyte and fluid balance, not elsewhere classified: Secondary | ICD-10-CM | POA: Diagnosis present

## 2021-02-28 DIAGNOSIS — S0990XA Unspecified injury of head, initial encounter: Secondary | ICD-10-CM | POA: Diagnosis not present

## 2021-02-28 DIAGNOSIS — R0902 Hypoxemia: Secondary | ICD-10-CM | POA: Diagnosis present

## 2021-02-28 DIAGNOSIS — M5416 Radiculopathy, lumbar region: Secondary | ICD-10-CM | POA: Diagnosis not present

## 2021-02-28 DIAGNOSIS — I739 Peripheral vascular disease, unspecified: Secondary | ICD-10-CM | POA: Diagnosis not present

## 2021-02-28 DIAGNOSIS — S42295A Other nondisplaced fracture of upper end of left humerus, initial encounter for closed fracture: Secondary | ICD-10-CM | POA: Diagnosis not present

## 2021-02-28 DIAGNOSIS — K122 Cellulitis and abscess of mouth: Secondary | ICD-10-CM | POA: Diagnosis not present

## 2021-02-28 DIAGNOSIS — I4821 Permanent atrial fibrillation: Secondary | ICD-10-CM | POA: Diagnosis not present

## 2021-02-28 DIAGNOSIS — Z20822 Contact with and (suspected) exposure to covid-19: Secondary | ICD-10-CM | POA: Diagnosis not present

## 2021-02-28 DIAGNOSIS — E869 Volume depletion, unspecified: Secondary | ICD-10-CM | POA: Diagnosis not present

## 2021-02-28 DIAGNOSIS — I10 Essential (primary) hypertension: Secondary | ICD-10-CM | POA: Diagnosis not present

## 2021-02-28 DIAGNOSIS — F32A Depression, unspecified: Secondary | ICD-10-CM | POA: Diagnosis present

## 2021-02-28 DIAGNOSIS — K5900 Constipation, unspecified: Secondary | ICD-10-CM | POA: Diagnosis not present

## 2021-02-28 DIAGNOSIS — E119 Type 2 diabetes mellitus without complications: Secondary | ICD-10-CM

## 2021-02-28 DIAGNOSIS — D638 Anemia in other chronic diseases classified elsewhere: Secondary | ICD-10-CM | POA: Diagnosis present

## 2021-02-28 DIAGNOSIS — K219 Gastro-esophageal reflux disease without esophagitis: Secondary | ICD-10-CM | POA: Diagnosis not present

## 2021-02-28 DIAGNOSIS — F339 Major depressive disorder, recurrent, unspecified: Secondary | ICD-10-CM | POA: Diagnosis not present

## 2021-02-28 DIAGNOSIS — M545 Low back pain, unspecified: Secondary | ICD-10-CM | POA: Diagnosis not present

## 2021-02-28 DIAGNOSIS — Z7984 Long term (current) use of oral hypoglycemic drugs: Secondary | ICD-10-CM

## 2021-02-28 DIAGNOSIS — G4700 Insomnia, unspecified: Secondary | ICD-10-CM | POA: Diagnosis not present

## 2021-02-28 DIAGNOSIS — S32425A Nondisplaced fracture of posterior wall of left acetabulum, initial encounter for closed fracture: Secondary | ICD-10-CM | POA: Diagnosis not present

## 2021-02-28 DIAGNOSIS — S40022D Contusion of left upper arm, subsequent encounter: Secondary | ICD-10-CM | POA: Diagnosis not present

## 2021-02-28 DIAGNOSIS — H409 Unspecified glaucoma: Secondary | ICD-10-CM | POA: Diagnosis not present

## 2021-02-28 DIAGNOSIS — R400 Somnolence: Secondary | ICD-10-CM | POA: Diagnosis not present

## 2021-02-28 DIAGNOSIS — S32402A Unspecified fracture of left acetabulum, initial encounter for closed fracture: Secondary | ICD-10-CM | POA: Diagnosis present

## 2021-02-28 DIAGNOSIS — M62838 Other muscle spasm: Secondary | ICD-10-CM | POA: Diagnosis not present

## 2021-02-28 DIAGNOSIS — S299XXA Unspecified injury of thorax, initial encounter: Secondary | ICD-10-CM | POA: Diagnosis not present

## 2021-02-28 DIAGNOSIS — M6281 Muscle weakness (generalized): Secondary | ICD-10-CM | POA: Diagnosis not present

## 2021-02-28 DIAGNOSIS — G2581 Restless legs syndrome: Secondary | ICD-10-CM | POA: Diagnosis not present

## 2021-02-28 DIAGNOSIS — I110 Hypertensive heart disease with heart failure: Secondary | ICD-10-CM | POA: Diagnosis not present

## 2021-02-28 DIAGNOSIS — Z888 Allergy status to other drugs, medicaments and biological substances status: Secondary | ICD-10-CM

## 2021-02-28 DIAGNOSIS — S40022A Contusion of left upper arm, initial encounter: Secondary | ICD-10-CM | POA: Diagnosis present

## 2021-02-28 DIAGNOSIS — R9082 White matter disease, unspecified: Secondary | ICD-10-CM | POA: Diagnosis not present

## 2021-02-28 DIAGNOSIS — S32402D Unspecified fracture of left acetabulum, subsequent encounter for fracture with routine healing: Secondary | ICD-10-CM | POA: Diagnosis not present

## 2021-02-28 DIAGNOSIS — Z79899 Other long term (current) drug therapy: Secondary | ICD-10-CM

## 2021-02-28 DIAGNOSIS — S42292A Other displaced fracture of upper end of left humerus, initial encounter for closed fracture: Secondary | ICD-10-CM | POA: Diagnosis not present

## 2021-02-28 DIAGNOSIS — M25512 Pain in left shoulder: Secondary | ICD-10-CM | POA: Diagnosis not present

## 2021-02-28 DIAGNOSIS — I5032 Chronic diastolic (congestive) heart failure: Secondary | ICD-10-CM | POA: Diagnosis present

## 2021-02-28 DIAGNOSIS — I700 Atherosclerosis of aorta: Secondary | ICD-10-CM | POA: Diagnosis not present

## 2021-02-28 DIAGNOSIS — D649 Anemia, unspecified: Secondary | ICD-10-CM | POA: Diagnosis not present

## 2021-02-28 DIAGNOSIS — E785 Hyperlipidemia, unspecified: Secondary | ICD-10-CM | POA: Diagnosis present

## 2021-02-28 DIAGNOSIS — K047 Periapical abscess without sinus: Secondary | ICD-10-CM | POA: Diagnosis not present

## 2021-02-28 DIAGNOSIS — Z87891 Personal history of nicotine dependence: Secondary | ICD-10-CM

## 2021-02-28 DIAGNOSIS — E1159 Type 2 diabetes mellitus with other circulatory complications: Secondary | ICD-10-CM | POA: Diagnosis not present

## 2021-02-28 DIAGNOSIS — W19XXXA Unspecified fall, initial encounter: Secondary | ICD-10-CM | POA: Diagnosis present

## 2021-02-28 DIAGNOSIS — Z7901 Long term (current) use of anticoagulants: Secondary | ICD-10-CM

## 2021-02-28 DIAGNOSIS — E46 Unspecified protein-calorie malnutrition: Secondary | ICD-10-CM | POA: Diagnosis not present

## 2021-02-28 DIAGNOSIS — F419 Anxiety disorder, unspecified: Secondary | ICD-10-CM | POA: Diagnosis present

## 2021-02-28 DIAGNOSIS — E1142 Type 2 diabetes mellitus with diabetic polyneuropathy: Secondary | ICD-10-CM | POA: Diagnosis not present

## 2021-02-28 DIAGNOSIS — Z743 Need for continuous supervision: Secondary | ICD-10-CM | POA: Diagnosis not present

## 2021-02-28 DIAGNOSIS — S42252A Displaced fracture of greater tuberosity of left humerus, initial encounter for closed fracture: Secondary | ICD-10-CM | POA: Diagnosis not present

## 2021-02-28 DIAGNOSIS — E1169 Type 2 diabetes mellitus with other specified complication: Secondary | ICD-10-CM | POA: Diagnosis not present

## 2021-02-28 LAB — COMPREHENSIVE METABOLIC PANEL
ALT: 16 U/L (ref 0–44)
AST: 25 U/L (ref 15–41)
Albumin: 4.3 g/dL (ref 3.5–5.0)
Alkaline Phosphatase: 55 U/L (ref 38–126)
Anion gap: 9 (ref 5–15)
BUN: 21 mg/dL (ref 8–23)
CO2: 26 mmol/L (ref 22–32)
Calcium: 9.3 mg/dL (ref 8.9–10.3)
Chloride: 91 mmol/L — ABNORMAL LOW (ref 98–111)
Creatinine, Ser: 1.2 mg/dL — ABNORMAL HIGH (ref 0.44–1.00)
GFR, Estimated: 48 mL/min — ABNORMAL LOW (ref >60)
Glucose, Bld: 183 mg/dL — ABNORMAL HIGH (ref 70–99)
Potassium: 4.5 mmol/L (ref 3.5–5.1)
Sodium: 126 mmol/L — ABNORMAL LOW (ref 135–145)
Total Bilirubin: 0.8 mg/dL (ref 0.3–1.2)
Total Protein: 7.5 g/dL (ref 6.5–8.1)

## 2021-02-28 LAB — CBC WITH DIFFERENTIAL/PLATELET
Abs Immature Granulocytes: 0.04 10*3/uL (ref 0.00–0.07)
Basophils Absolute: 0.1 10*3/uL (ref 0.0–0.1)
Basophils Relative: 1 %
Eosinophils Absolute: 0.2 10*3/uL (ref 0.0–0.5)
Eosinophils Relative: 3 %
HCT: 32.3 % — ABNORMAL LOW (ref 36.0–46.0)
Hemoglobin: 11.2 g/dL — ABNORMAL LOW (ref 12.0–15.0)
Immature Granulocytes: 1 %
Lymphocytes Relative: 10 %
Lymphs Abs: 0.7 10*3/uL (ref 0.7–4.0)
MCH: 30.2 pg (ref 26.0–34.0)
MCHC: 34.7 g/dL (ref 30.0–36.0)
MCV: 87.1 fL (ref 80.0–100.0)
Monocytes Absolute: 0.7 10*3/uL (ref 0.1–1.0)
Monocytes Relative: 9 %
Neutro Abs: 5.8 10*3/uL (ref 1.7–7.7)
Neutrophils Relative %: 76 %
Platelets: 212 10*3/uL (ref 150–400)
RBC: 3.71 MIL/uL — ABNORMAL LOW (ref 3.87–5.11)
RDW: 12.7 % (ref 11.5–15.5)
WBC: 7.5 10*3/uL (ref 4.0–10.5)
nRBC: 0 % (ref 0.0–0.2)

## 2021-02-28 LAB — HEMOGLOBIN A1C
Hgb A1c MFr Bld: 7.1 % — ABNORMAL HIGH (ref 4.8–5.6)
Mean Plasma Glucose: 157.07 mg/dL

## 2021-02-28 LAB — RESP PANEL BY RT-PCR (FLU A&B, COVID) ARPGX2
Influenza A by PCR: NEGATIVE
Influenza B by PCR: NEGATIVE
SARS Coronavirus 2 by RT PCR: NEGATIVE

## 2021-02-28 LAB — GLUCOSE, CAPILLARY: Glucose-Capillary: 137 mg/dL — ABNORMAL HIGH (ref 70–99)

## 2021-02-28 MED ORDER — INSULIN ASPART 100 UNIT/ML IJ SOLN
0.0000 [IU] | Freq: Three times a day (TID) | INTRAMUSCULAR | Status: DC
  Administered 2021-03-01: 2 [IU] via SUBCUTANEOUS
  Administered 2021-03-01 – 2021-03-02 (×2): 1 [IU] via SUBCUTANEOUS
  Administered 2021-03-02 (×2): 2 [IU] via SUBCUTANEOUS
  Administered 2021-03-03: 1 [IU] via SUBCUTANEOUS
  Administered 2021-03-03 – 2021-03-05 (×6): 2 [IU] via SUBCUTANEOUS
  Administered 2021-03-05 – 2021-03-06 (×3): 3 [IU] via SUBCUTANEOUS
  Administered 2021-03-06: 2 [IU] via SUBCUTANEOUS
  Administered 2021-03-06: 3 [IU] via SUBCUTANEOUS
  Administered 2021-03-07: 2 [IU] via SUBCUTANEOUS
  Administered 2021-03-07: 3 [IU] via SUBCUTANEOUS
  Administered 2021-03-07: 2 [IU] via SUBCUTANEOUS

## 2021-02-28 MED ORDER — OXYCODONE HCL 5 MG PO TABS
5.0000 mg | ORAL_TABLET | Freq: Four times a day (QID) | ORAL | Status: DC | PRN
Start: 2021-02-28 — End: 2021-03-03
  Administered 2021-02-28 – 2021-03-03 (×5): 5 mg via ORAL
  Filled 2021-02-28 (×5): qty 1

## 2021-02-28 MED ORDER — HYDROMORPHONE HCL 1 MG/ML IJ SOLN
0.5000 mg | Freq: Once | INTRAMUSCULAR | Status: AC
  Administered 2021-02-28: 0.5 mg via INTRAVENOUS
  Filled 2021-02-28: qty 1

## 2021-02-28 MED ORDER — HYDROMORPHONE HCL 1 MG/ML IJ SOLN
0.5000 mg | INTRAMUSCULAR | Status: DC | PRN
Start: 2021-02-28 — End: 2021-03-08
  Administered 2021-03-01 – 2021-03-04 (×5): 0.5 mg via INTRAVENOUS
  Filled 2021-02-28 (×6): qty 0.5

## 2021-02-28 MED ORDER — ENOXAPARIN SODIUM 40 MG/0.4ML IJ SOSY
40.0000 mg | PREFILLED_SYRINGE | INTRAMUSCULAR | Status: DC
  Administered 2021-02-28 – 2021-03-01 (×2): 40 mg via SUBCUTANEOUS
  Filled 2021-02-28 (×2): qty 0.4

## 2021-02-28 MED ORDER — ROPINIROLE HCL 0.5 MG PO TABS
1.0000 mg | ORAL_TABLET | ORAL | Status: AC
  Administered 2021-02-28: 1 mg via ORAL
  Filled 2021-02-28: qty 2

## 2021-02-28 MED ORDER — INSULIN ASPART 100 UNIT/ML IJ SOLN
0.0000 [IU] | Freq: Every day | INTRAMUSCULAR | Status: DC
  Administered 2021-03-04: 2 [IU] via SUBCUTANEOUS

## 2021-02-28 MED ORDER — ONDANSETRON HCL 4 MG/2ML IJ SOLN: 4.0000 mg | Freq: Four times a day (QID) | INTRAMUSCULAR | Status: DC | PRN

## 2021-02-28 MED ORDER — POLYETHYLENE GLYCOL 3350 17 G PO PACK: 17.0000 g | PACK | Freq: Every day | ORAL | Status: DC | PRN

## 2021-02-28 MED ORDER — ACETAMINOPHEN 325 MG PO TABS: 650.0000 mg | ORAL_TABLET | Freq: Four times a day (QID) | ORAL | Status: DC | PRN

## 2021-02-28 MED ORDER — PAROXETINE HCL 20 MG PO TABS
25.0000 mg | ORAL_TABLET | Freq: Every day | ORAL | Status: DC
  Administered 2021-03-01 – 2021-03-07 (×7): 25 mg via ORAL
  Filled 2021-02-28 (×7): qty 0.5

## 2021-02-28 MED ORDER — SODIUM CHLORIDE 0.9 % IV BOLUS
500.0000 mL | Freq: Once | INTRAVENOUS | Status: AC
  Administered 2021-02-28: 500 mL via INTRAVENOUS

## 2021-02-28 NOTE — ED Notes (Addendum)
Pt arrived via [facility] with c/c of Fall. Per EMS pt is on xeralto. Pt is from home, where EMS was sitting in floor beside bed where she was sleeping and rolled out of bed. Pt arrives with L shoulder and hip pain. No shortening or rotation. Denies neck and back pain, pt arrived in c-collar due to stiffness which she endorses before the fall.   80HR, 98%RA, BS 175, 137/68

## 2021-02-28 NOTE — ED Provider Notes (Signed)
[facility] EMERGENCY DEPARTMENT Provider Note   CSN: [id number] Arrival date & time: 02/28/21  1122     History Chief Complaint  Patient presents with   Fall    Pt arrived via [facility] with c/c of Fall. Per EMS pt is on xeralto. Pt is from home, where EMS was sitting in floor beside bed where she was sleeping and rolled out of bed. Pt arrives with L shoulder and hip pain. No shortening or rotation. Denies neck and back pain, pt arrived in c-collar due to stiffness which she endorses before the fall.   80HR, 98%RA, BS 175, 137/68     [name] is a 70 y.o. female.  Patient was asleep and rolled out of bed.  She is complaining of pain to her left shoulder and left hip.  The history is provided by the patient and medical records. No language interpreter was used.  Fall This is a new problem. The current episode started 6 to 12 hours ago. The problem occurs rarely. The problem has not changed since onset.Pertinent negatives include no chest pain, no abdominal pain and no headaches. Nothing aggravates the symptoms. Nothing relieves the symptoms. She has tried nothing for the symptoms.      No past medical history on file.  There are no problems to display for this patient.     OB History   No obstetric history on file.     No family history on file.     Home Medications Prior to Admission medications   Medication Sig Start Date End Date Taking? Authorizing Provider  celecoxib (CELEBREX) 200 MG capsule Take 200 mg by mouth daily. 01/05/21   [provider]  diltiazem (CARDIZEM CD) 180 MG 24 hr capsule Take 180 mg by mouth daily. 12/20/20   [provider]  famotidine (PEPCID) 20 MG tablet Take 20 mg by mouth 2 (two) times daily. 02/20/21   [provider]  INVOKANA 100 MG TABS tablet Take 100 mg by mouth every morning. 02/24/21   [provider]  latanoprost (XALATAN) 0.005 % ophthalmic solution 1 drop at bedtime.  02/07/21   [provider]  LORazepam (ATIVAN) 0.5 MG tablet Take 0.5-1 mg by mouth at bedtime. 02/01/21   [provider]  metoprolol succinate (TOPROL-XL) 100 MG 24 hr tablet Take 100 mg by mouth 2 (two) times daily. 02/24/21   [provider]  metoprolol tartrate (LOPRESSOR) 25 MG tablet Take 25 mg by mouth daily as needed. 02/24/21   [provider]  PARoxetine (PAXIL) 10 MG tablet Take 25 mg by mouth daily. 02/24/21   [provider]  potassium chloride (MICRO-K) 10 MEQ CR capsule Take 10 mEq by mouth 2 (two) times daily. 02/20/21   [provider]  quinapril (ACCUPRIL) 40 MG tablet Take 40 mg by mouth daily. 12/12/20   [provider]  rosuvastatin (CRESTOR) 10 MG tablet Take 10 mg by mouth at bedtime. 01/06/21   [provider]  torsemide (DEMADEX) 20 MG tablet Take 40 mg by mouth 2 (two) times daily. 11/30/20   [provider]  traZODone (DESYREL) 100 MG tablet Take 50-100 mg by mouth at bedtime. 01/31/21   [provider]  XARELTO 20 MG TABS tablet Take 20 mg by mouth daily. 02/01/21   [provider]    Allergies    Benadryl [diphenhydramine]  Review of Systems   Review of Systems  Constitutional:  Negative for appetite change and fatigue.  HENT:  Negative for congestion, ear discharge and sinus pressure.   Eyes:  Negative for discharge.  Respiratory:  Negative for cough.   Cardiovascular:  Negative for chest pain.  Gastrointestinal:  Negative for abdominal pain and diarrhea.  Genitourinary:  Negative for frequency and hematuria.  Musculoskeletal:  Negative for back pain.       Left hip and left shoulder pain  Skin:  Negative for rash.  Neurological:  Negative for seizures and headaches.  Psychiatric/Behavioral:  Negative for hallucinations.    Physical Exam Updated Vital Signs BP (!) 108/41   Pulse 80   Temp 98.1 F (36.7 C) (Oral)   Resp 17   Ht 5\' 2"  (1.575 m)   Wt 81.6 kg    SpO2 91%   BMI 32.92 kg/m   Physical Exam Vitals and nursing note reviewed.  Constitutional:      Appearance: She is well-developed.  HENT:     Head: Normocephalic.     Nose: Nose normal.  Eyes:     General: No scleral icterus.    Conjunctiva/sclera: Conjunctivae normal.  Neck:     Thyroid: No thyromegaly.  Cardiovascular:     Rate and Rhythm: Normal rate and regular rhythm.     Heart sounds: No murmur heard.   No friction rub. No gallop.  Pulmonary:     Breath sounds: No stridor. No wheezing or rales.  Chest:     Chest wall: No tenderness.  Abdominal:     General: There is no distension.     Tenderness: There is no abdominal tenderness. There is no rebound.  Musculoskeletal:     Cervical back: Neck supple.     Comments: Tenderness to left hip and left shoulder  Lymphadenopathy:     Cervical: No cervical adenopathy.  Skin:    Findings: No erythema or rash.  Neurological:     Mental Status: She is alert and oriented to person, place, and time.     Motor: No abnormal muscle tone.     Coordination: Coordination normal.  Psychiatric:        Behavior: Behavior normal.    ED Results / Procedures / Treatments   Labs (all labs ordered are listed, but only abnormal results are displayed) Labs Reviewed  CBC WITH DIFFERENTIAL/PLATELET - Abnormal; Notable for the following components:      Result Value   RBC 3.71 (*)    Hemoglobin 11.2 (*)    HCT 32.3 (*)    All other components within normal limits  COMPREHENSIVE METABOLIC PANEL - Abnormal; Notable for the following components:   Sodium 126 (*)    Chloride 91 (*)    Glucose, Bld 183 (*)    Creatinine, Ser 1.20 (*)    GFR, Estimated 48 (*)    All other components within normal limits    EKG None  Radiology DG Lumbar Spine Complete  Result Date: 02/28/2021 CLINICAL DATA:  Fall out of bed this morning. Low back pain. Initial encounter. EXAM: LUMBAR SPINE - COMPLETE 4+ VIEW COMPARISON:  09/23/2020 FINDINGS: There  is no evidence of lumbar spine fracture. Alignment is normal. Severe degenerative disc disease is seen at L5-S1. Mild degenerative disc disease is seen at L2-3 and L3-4. No evidence of facet DJD or other significant bone abnormality. Aortic atherosclerotic calcification noted. No significant change is seen compared to prior study. IMPRESSION: No acute findings. Degenerative spondylosis, as described above. Electronically Signed   By: Marlaine Hind M.D.   On: 02/28/2021 13:45  CT Head Wo Contrast  Result Date: 02/28/2021 CLINICAL DATA:  Patient fell and struck her head. EXAM: CT HEAD WITHOUT CONTRAST TECHNIQUE: Contiguous axial images were obtained from the base of the skull through the vertex without intravenous contrast. COMPARISON:  03/07/2020 FINDINGS: Brain: There is no evidence for acute hemorrhage, hydrocephalus, mass lesion, or abnormal extra-axial fluid collection. No definite CT evidence for acute infarction. Diffuse loss of parenchymal volume is consistent with atrophy. Patchy low attenuation in the deep hemispheric and periventricular white matter is nonspecific, but likely reflects chronic microvascular ischemic demyelination. Vascular: No hyperdense vessel or unexpected calcification. Skull: No evidence for fracture. No worrisome lytic or sclerotic lesion. Surgical changes noted right mastoid region. Sinuses/Orbits: The visualized paranasal sinuses and mastoid air cells are clear. Visualized portions of the globes and intraorbital fat are unremarkable. Other: None. IMPRESSION: 1. No acute intracranial abnormality. 2. Atrophy with chronic small vessel white matter ischemic disease. Electronically Signed   By: Misty Stanley M.D.   On: 02/28/2021 12:50   DG Chest Port 1 View  Result Date: 02/28/2021 CLINICAL DATA:  Pt arrived via [facility] with c/c of Fall. Per EMS pt is on xeralto. Pt is from home, where EMS was sitting in floor beside bed where she was sleeping and rolled out of bed. Pt arrives with  L shoulder and hip pain EXAM: PORTABLE CHEST - 1 VIEW COMPARISON:  11/03/2020 FINDINGS: Relatively low lung volumes. Chronic linear scarring at the left lung base. No new infiltrate or overt edema. Heart size and mediastinal contours are within normal limits. Aortic Atherosclerosis (ICD10-170.0). No effusion.  No pneumothorax. Left humeral neck fracture. IMPRESSION: 1. No acute cardiopulmonary disease. 2. Left humeral neck fracture Electronically Signed   By: Lucrezia Europe M.D.   On: 02/28/2021 12:24   DG Shoulder Left Portable  Result Date: 02/28/2021 CLINICAL DATA:  Pain post fall out of bed EXAM: LEFT SHOULDER COMPARISON:  None. FINDINGS: Minimally displaced fracture across the surgical neck of the humerus involving the greater tuberosity. No dislocation. No significant angulation deformity a or distraction. IMPRESSION: Minimally displaced fracture across the surgical neck of the humerus and greater tuberosity. Electronically Signed   By: Lucrezia Europe M.D.   On: 02/28/2021 12:26   DG Hip Unilat W or Wo Pelvis 2-3 Views Left  Result Date: 02/28/2021 CLINICAL DATA:  Fall. EXAM: DG HIP (WITH OR WITHOUT PELVIS) 2-3V LEFT COMPARISON:  09/23/2020. FINDINGS: Diffuse osteopenia and degenerative change. No evidence of fracture or dislocation. Sclerotic changes about both femoral heads. Avascular necrosis cannot be excluded. Aortoiliac and peripheral vascular calcification. IMPRESSION: 1. Diffuse osteopenia degenerative change. No evidence of fracture dislocation. 2. Sclerotic changes both femoral heads. Avascular necrosis cannot be excluded. 3.  Peripheral vascular disease. Electronically Signed   By: Marcello Moores  Register   On: 02/28/2021 13:44    Procedures Procedures   Medications Ordered in ED Medications  sodium chloride 0.9 % bolus 500 mL (500 mLs Intravenous New Bag/Given 02/28/21 1541)    ED Course  I have reviewed the triage vital signs and the nursing notes.  Pertinent labs & imaging results that were  available during my care of the patient were reviewed by me and considered in my medical decision making (see chart for details).    MDM Rules/Calculators/A&P                           Patient with a fall.  Left shoulder shows proximal humeral fracture.  Left hip does not show any fracture on plain films we will get a CT. Final Clinical Impression(s) / ED Diagnoses Final diagnoses:  None    Rx / DC Orders ED Discharge Orders     None        Milton Ferguson, MD 03/02/21 1031

## 2021-02-28 NOTE — ED Notes (Signed)
Patient transported to CT 

## 2021-02-28 NOTE — ED Notes (Addendum)
C-Collar removed by Milton Ferguson, MD

## 2021-02-28 NOTE — ED Notes (Signed)
Pt's  O2 dropped to 85%, pt placed on 2L nasal cannula

## 2021-02-28 NOTE — Progress Notes (Signed)
Orthopedic Tech Progress Note Patient Details:  [name] 1951/05/02 [id number]  Level 2 trauma   Patient ID: [name], female   DOB: Apr 13, 1951, 70 y.o.   MRN: [id number]  [name] 02/28/2021, 12:18 PM

## 2021-02-28 NOTE — Progress Notes (Signed)
Orthopedics consulted for proximal humerus and acetabular fractures. Non-operative management for both injuries.  Left upper extremity: NWB in sling  Left lower extremity: Touchdown weight bearing  Per ED provider, will likely need medical admission for hypoxia, discharge planning, and PT/OT evaluations. Formal consultation to follow if patient admitted.    Noemi Chapel, PA-C 02/28/2021

## 2021-02-28 NOTE — Progress Notes (Signed)
Orthopedic Tech Progress Note Patient Details:  [name] [birth year] [id number]  Ortho Devices Type of Ortho Device: Sling immobilizer Ortho Device/Splint Location: LUE Ortho Device/Splint Interventions: Ordered, Application, Adjustment   Post Interventions Patient Tolerated: Well Instructions Provided: Care of [place] 02/28/2021, 5:17 PM

## 2021-02-28 NOTE — ED Provider Notes (Signed)
4:49 PM Care assumed from Dr. Roderic Palau.  At time of transfer of care, patient is awaiting results of CT of the hip to look for occult injury.  Patient was found to have a humerus fracture and will follow-up with orthopedics as an outpatient.  Patient had some hyponatremia and hypochloremia which she was given fluids for.  Plan of care with a discharge if no acute fractures discovered.  6:43 PM CT returned showing acetabular fracture.  She does have a left humeral fracture.  Patient is in a sling already but is now somnolent and difficult to arouse with the pain medication use earlier.  She is also on oxygen which she is not normally on.  Family reports that she is left-handed.  With this acetabular fracture, I do not feel that she would be able to use crutches with her new shoulder fracture.  Will call orthopedics to discuss management but I anticipate this will be a nonoperative management given it not being displaced or any humeral head injury.  Family reports that her hyponatremia is a chronic problem for her but they are concerned about polypharmacy with her and having fatigue and frequent somnolence.  Given her multiple injuries, they are also concerned about her being able to go home as she lives at home alone with no assistance from medical standpoint.  Will speak with orthopedics but I do anticipate she will need admission to likely medicine service with orthopedic consultation with plans to optimize medications, treat her hypoxia, have PT/OT see her to discuss if she needs other management versus rehab, and for managing her hyponatremia.  Anticipate admission after speaking with orthopedics.  7:02 PM Just spoke with orthopedics team who agrees with medicine admission and they will see in consultation.  As of now they suspect it will be nonoperative injuries however they will see in consultation.  They did request a CT of the shoulder to further assess the injury.  They will see and patient will  be admitted to medicine.   Clinical Impression: 1. Fall, initial encounter   2. Other closed nondisplaced fracture of proximal end of left humerus, initial encounter   3. Closed nondisplaced fracture of left acetabulum, unspecified portion of acetabulum, initial encounter ([place])   4. Somnolence   5. Hypoxia     Disposition: Admit  This note was prepared with assistance of Dragon voice recognition software. Occasional wrong-word or sound-a-like substitutions may have occurred due to the inherent limitations of voice recognition software.     Ellana Kawa, Gwenyth Allegra, MD 02/28/21 2038

## 2021-02-28 NOTE — ED Notes (Signed)
Attempted report 

## 2021-03-01 ENCOUNTER — Encounter (HOSPITAL_COMMUNITY): Payer: Self-pay | Admitting: Internal Medicine

## 2021-03-01 DIAGNOSIS — I4821 Permanent atrial fibrillation: Secondary | ICD-10-CM

## 2021-03-01 DIAGNOSIS — W19XXXA Unspecified fall, initial encounter: Secondary | ICD-10-CM

## 2021-03-01 DIAGNOSIS — S32402A Unspecified fracture of left acetabulum, initial encounter for closed fracture: Secondary | ICD-10-CM

## 2021-03-01 LAB — BASIC METABOLIC PANEL
Anion gap: 8 (ref 5–15)
BUN: 23 mg/dL (ref 8–23)
CO2: 24 mmol/L (ref 22–32)
Calcium: 8.8 mg/dL — ABNORMAL LOW (ref 8.9–10.3)
Chloride: 96 mmol/L — ABNORMAL LOW (ref 98–111)
Creatinine, Ser: 1.5 mg/dL — ABNORMAL HIGH (ref 0.44–1.00)
GFR, Estimated: 37 mL/min — ABNORMAL LOW (ref >60)
Glucose, Bld: 160 mg/dL — ABNORMAL HIGH (ref 70–99)
Potassium: 5.2 mmol/L — ABNORMAL HIGH (ref 3.5–5.1)
Sodium: 128 mmol/L — ABNORMAL LOW (ref 135–145)

## 2021-03-01 LAB — GLUCOSE, CAPILLARY
Glucose-Capillary: 117 mg/dL — ABNORMAL HIGH (ref 70–99)
Glucose-Capillary: 174 mg/dL — ABNORMAL HIGH (ref 70–99)
Glucose-Capillary: 138 mg/dL — ABNORMAL HIGH (ref 70–99)
Glucose-Capillary: 166 mg/dL — ABNORMAL HIGH (ref 70–99)

## 2021-03-01 LAB — URINALYSIS, ROUTINE W REFLEX MICROSCOPIC
Bilirubin Urine: NEGATIVE
Glucose, UA: >=500 mg/dL — AB
Hgb urine dipstick: NEGATIVE
Ketones, ur: NEGATIVE mg/dL
Nitrite: NEGATIVE
Protein, ur: NEGATIVE mg/dL
Specific Gravity, Urine: 1.007 (ref 1.005–1.030)
pH: 5 (ref 5.0–8.0)

## 2021-03-01 LAB — CBC
HCT: 27.8 % — ABNORMAL LOW (ref 36.0–46.0)
Hemoglobin: 9.3 g/dL — ABNORMAL LOW (ref 12.0–15.0)
MCH: 29.3 pg (ref 26.0–34.0)
MCHC: 33.5 g/dL (ref 30.0–36.0)
MCV: 87.7 fL (ref 80.0–100.0)
Platelets: 209 10*3/uL (ref 150–400)
RBC: 3.17 MIL/uL — ABNORMAL LOW (ref 3.87–5.11)
RDW: 12.9 % (ref 11.5–15.5)
WBC: 11.4 10*3/uL — ABNORMAL HIGH (ref 4.0–10.5)
nRBC: 0 % (ref 0.0–0.2)

## 2021-03-01 LAB — OSMOLALITY: Osmolality: 278 mOsm/kg (ref 275–295)

## 2021-03-01 LAB — PHOSPHORUS: Phosphorus: 4.5 mg/dL (ref 2.5–4.6)

## 2021-03-01 LAB — TSH: TSH: 1.92 u[IU]/mL (ref 0.350–4.500)

## 2021-03-01 LAB — MAGNESIUM: Magnesium: 1.7 mg/dL (ref 1.7–2.4)

## 2021-03-01 LAB — OSMOLALITY, URINE: Osmolality, Ur: 252 mOsm/kg — ABNORMAL LOW (ref 300–900)

## 2021-03-01 LAB — CORTISOL: Cortisol, Plasma: 16 ug/dL

## 2021-03-01 LAB — SODIUM, URINE, RANDOM: Sodium, Ur: 27 mmol/L

## 2021-03-01 MED ORDER — FAMOTIDINE 20 MG PO TABS
20.0000 mg | ORAL_TABLET | Freq: Two times a day (BID) | ORAL | Status: DC
  Administered 2021-03-01 – 2021-03-07 (×12): 20 mg via ORAL
  Filled 2021-03-01 (×12): qty 1

## 2021-03-01 MED ORDER — LATANOPROST 0.005 % OP SOLN
1.0000 [drp] | Freq: Every day | OPHTHALMIC | Status: DC
  Administered 2021-03-01 – 2021-03-06 (×6): 1 [drp] via OPHTHALMIC
  Filled 2021-03-01: qty 2.5

## 2021-03-01 MED ORDER — SODIUM CHLORIDE 0.9 % IV SOLN: INTRAVENOUS | Status: AC

## 2021-03-01 MED ORDER — UMECLIDINIUM-VILANTEROL 62.5-25 MCG/INH IN AEPB
1.0000 | INHALATION_SPRAY | Freq: Every day | RESPIRATORY_TRACT | Status: DC
  Administered 2021-03-02 – 2021-03-07 (×6): 1 via RESPIRATORY_TRACT
  Filled 2021-03-01 (×2): qty 14

## 2021-03-01 MED ORDER — ROPINIROLE HCL 0.5 MG PO TABS
1.0000 mg | ORAL_TABLET | Freq: Three times a day (TID) | ORAL | Status: DC | PRN
  Administered 2021-03-01 – 2021-03-06 (×11): 1 mg via ORAL
  Filled 2021-03-01 (×12): qty 2

## 2021-03-01 MED ORDER — ROSUVASTATIN CALCIUM 5 MG PO TABS
10.0000 mg | ORAL_TABLET | Freq: Every day | ORAL | Status: DC
  Administered 2021-03-01 – 2021-03-06 (×6): 10 mg via ORAL
  Filled 2021-03-01 (×6): qty 2

## 2021-03-01 MED ORDER — METOPROLOL SUCCINATE ER 25 MG PO TB24
25.0000 mg | ORAL_TABLET | Freq: Two times a day (BID) | ORAL | Status: DC
  Administered 2021-03-01 – 2021-03-02 (×3): 25 mg via ORAL
  Filled 2021-03-01 (×3): qty 1

## 2021-03-01 MED ORDER — GUAIFENESIN ER 600 MG PO TB12
600.0000 mg | ORAL_TABLET | Freq: Two times a day (BID) | ORAL | Status: DC
  Administered 2021-03-01 – 2021-03-07 (×12): 600 mg via ORAL
  Filled 2021-03-01 (×12): qty 1

## 2021-03-01 MED ORDER — TRAZODONE HCL 50 MG PO TABS
50.0000 mg | ORAL_TABLET | Freq: Every day | ORAL | Status: DC
  Administered 2021-03-01 – 2021-03-06 (×6): 50 mg via ORAL
  Filled 2021-03-01 (×6): qty 1

## 2021-03-01 NOTE — Evaluation (Signed)
Physical Therapy Evaluation Patient Details Name: [name] MRN: [id number] DOB: [birth year] Today's Date: 03/01/2021   History of Present Illness  [name] is a 70 y.o. female who presented to [facility] ED from home after an unwitnessed fall out of bed. Pt presents with minimally displaced fracture involving the surgical neck of the proximal left humerus, and Acute nondisplaced fracture involving posterior wall of the acetabulum with extension of lucency to the articular surface (non-surgical management for both). Medical history significant for chronic hyponatremia, permanent A. fib on Eliquis, chronic diastolic CHF, COPD, type 2 diabetes, GERD, hypertension, hyperlipidemia, spinal stenosis   Clinical Impression  Pt presents with impaired strength, severe LUE/LLE pain, impaired sitting balance but improved with continued sitting EOB, max difficulty performing mobility tasks, and decreased activity tolerance vs baseline. Pt to benefit from acute PT to address deficits. Pt requiring max +2 for moving to EOB and scooting to drop arm recliner, pt very limited by pain at this time but is hardworking and motivated to return to independence. PT to progress mobility as tolerated, and will continue to follow acutely.      Follow Up Recommendations CIR;Supervision for mobility/OOB    Equipment Recommendations  None recommended by PT - pending   Recommendations for Other Services       Precautions / Restrictions Precautions Precautions: Fall;Posterior Hip Precaution Comments: posterior hip precautions reviewed given acetabular fx Required Braces or Orthoses: Sling Restrictions Weight Bearing Restrictions: Yes LUE Weight Bearing: Non weight bearing LLE Weight Bearing: Touchdown weight bearing      Mobility  Bed Mobility Overal bed mobility: Needs Assistance Bed Mobility: Supine to Sit     Supine to sit: Max assist;+2 for physical assistance     General bed mobility comments: max +2  for helicopter method to EOB, avoiding LUE given pain and NWB and maintaining LLE hip precautions. Verbal cuing for step-by-step sequencing. Boost to EOB with bed pads.    Transfers Overall transfer level: Needs assistance Equipment used: 2 person hand held assist Transfers: Lateral/Scoot Transfers          Lateral/Scoot Transfers: Max assist;+2 safety/equipment;+2 physical assistance;From elevated surface General transfer comment: max +2 for lateral scoot pivot to drop arm recliner towards R, step-wise scoot with bed pads and pt offweighting. PT and OT reinforcing TDWB LLE during transfer.  Ambulation/Gait                Stairs            Wheelchair Mobility    Modified Rankin (Stroke Patients Only)       Balance Overall balance assessment: Needs assistance Sitting-balance support: Feet supported;Single extremity supported Sitting balance-Leahy Scale: Fair                                       Pertinent Vitals/Pain Pain Assessment: Faces Faces Pain Scale: Hurts whole lot Pain Location: LLE, LUE Pain Descriptors / Indicators: Constant;Guarding;Grimacing;Sore Pain Intervention(s): Limited activity within patient's tolerance;RN gave pain meds during session    Prentice expects to be discharged to:: Private residence Living Arrangements: Spouse/significant other;Children Available Help at Discharge: Family;Available 24 hours/day Type of Home: Other(Comment) Home Access: Level entry     Home Layout: One level Home Equipment: Clinical cytogeneticist - 2 wheels;Walker - standard;Crutches;Cane - single point;Bedside commode      Prior Function Level of Independence: Independent  Comments: pediatric SLP     Hand Dominance   Dominant Hand: Left    Extremity/Trunk Assessment   Upper Extremity Assessment Upper Extremity Assessment: LUE deficits/detail LUE Deficits / Details: proximal L humerus fx LUE: Unable to  fully assess due to pain;Unable to fully assess due to immobilization LUE Sensation: WNL LUE Coordination: decreased fine motor;decreased gross motor    Lower Extremity Assessment Lower Extremity Assessment: Defer to PT evaluation LLE Deficits / Details: very limited by pain; able to perform ankle pumps, quad set, hip abd/add (no adduction past midline) but vocalizing in pain throughout LLE: Unable to fully assess due to pain    Cervical / Trunk Assessment Cervical / Trunk Assessment: Normal  Communication   Communication: No difficulties  Cognition Arousal/Alertness: Awake/alert Behavior During Therapy: WFL for tasks assessed/performed Overall Cognitive Status: Within Functional Limits for tasks assessed                                        General Comments General comments (skin integrity, edema, etc.): BP 113/50 both in supine and sitting, SpO2 89-96% on RA, 99% 1LO2    Exercises     Assessment/Plan    PT Assessment Patient needs continued PT services  PT Problem List Decreased strength;Decreased mobility;Decreased activity tolerance;Decreased balance;Decreased range of motion;Decreased safety awareness;Decreased knowledge of precautions;Cardiopulmonary status limiting activity;Pain;Obesity       PT Treatment Interventions DME instruction;Therapeutic activities;Gait training;Therapeutic exercise;Patient/family education;Balance training;Stair training;Functional mobility training;Neuromuscular re-education    PT Goals (Current goals can be found in the Care Plan section)  Acute Rehab PT Goals Patient Stated Goal: get better, go home PT Goal Formulation: With patient Time For Goal Achievement: 03/15/21 Potential to Achieve Goals: Good    Frequency Min 3X/week   Barriers to discharge        Co-evaluation PT/OT/SLP Co-Evaluation/Treatment: Yes Reason for Co-Treatment: For patient/therapist safety;To address functional/ADL transfers;Complexity of  the patient's impairments (multi-system involvement) PT goals addressed during session: Mobility/safety with mobility;Balance OT goals addressed during session: ADL's and self-care;Strengthening/ROM       AM-PAC PT "6 Clicks" Mobility  Outcome Measure Help needed turning from your back to your side while in a flat bed without using bedrails?: A Little Help needed moving from lying on your back to sitting on the side of a flat bed without using bedrails?: A Little Help needed moving to and from a bed to a chair (including a wheelchair)?: A Little Help needed standing up from a chair using your arms (e.g., wheelchair or bedside chair)?: A Little Help needed to walk in hospital room?: A Little Help needed climbing 3-5 steps with a railing? : A Lot 6 Click Score: 17    End of Session   Activity Tolerance: Patient limited by fatigue;Patient limited by pain Patient left: in chair;with chair alarm set;with call bell/phone within reach Nurse Communication: Mobility status (+2 scoot pivot back to bed towards R, with drop arm recliner) PT Visit Diagnosis: Muscle weakness (generalized) (M62.81);Pain Pain - Right/Left: Left Pain - part of body: Shoulder;Hip;Leg    Time: 6237-6283 PT Time Calculation (min) (ACUTE ONLY): 44 min   Charges:   PT Evaluation $PT Eval Low Complexity: 1 Low        Zeniya Lapidus S, PT DPT Acute Rehabilitation Services Pager [phone]  Office [phone]   Louis Matte 03/01/2021, 12:53 PM

## 2021-03-01 NOTE — TOC Initial Note (Signed)
Transition of Care [facility]) - Initial/Assessment Note    Patient Details  Name: [name] MRN: [id number] Date of Birth: [birth year]  Transition of Care [facility]) CM/SW Contact:    Sharin Mons, RN Phone Number: 03/01/2021, 2:10 PM  Clinical Narrative:      Admitted s/p a fall. Suffered L humerus fx  and acetabular fractures. Hx of chronic hyponatremia, permanent A. fib on Eliquis, chronic diastolic CHF, COPD, type 2 diabetes, GERD, hypertension, hyperlipidemia, spinal stenosis. From home with wife, [name]. PTA independent with ADL's, no DME usage.  Per ortho: pt injuries can be treated non-operatively, both injuries require pt to be non-weight bearing, there will be difficulty in getting pt to mobilize. Pt may require surgical intervention to help with earlier mobilization...orthopedic traumatologist / Dr.Haddix consulted.  TOC team monitoring and will assist with TOC needs.  Expected Discharge Plan: IP Rehab Facility Barriers to Discharge: Continued Medical Work up   Patient Goals and CMS Choice        Expected Discharge Plan and Services Expected Discharge Plan: [place]                                              Prior Living Arrangements/Services                       Activities of Daily Living Home Assistive Devices/Equipment: None ADL Screening (condition at time of admission) Is the patient deaf or have difficulty hearing?: No Does the patient have difficulty seeing, even when wearing glasses/contacts?: No Does the patient have difficulty concentrating, remembering, or making decisions?: No Does the patient have difficulty dressing or bathing?: No Does the patient have difficulty walking or climbing stairs?: No  Permission Sought/Granted                  Emotional Assessment              Admission diagnosis:  Somnolence [R40.0] Fall [W19.XXXA] Hypoxia [R09.02] Fall, initial encounter [id number]] Other closed  nondisplaced fracture of proximal end of left humerus, initial encounter [S42.295A] Closed nondisplaced fracture of left acetabulum, unspecified portion of acetabulum, initial encounter [facility]) [S32.402A] Patient Active Problem List   Diagnosis Date Noted   Fall 02/28/2021   PCP:  Hoyt Koch, MD Pharmacy:   [place], [place] [id number] Phone: [phone] Fax: [phone]     Social Determinants of Health (SDOH) Interventions    Readmission Risk Interventions No flowsheet data found.

## 2021-03-01 NOTE — Plan of Care (Signed)

## 2021-03-01 NOTE — Progress Notes (Addendum)
Occupational Therapy Evaluation Patient Details Name: [name] MRN: [id number] DOB: 1950/12/02 Today's Date: 03/01/2021    History of Present Illness [name] is a 70 y.o. female who presented to [facility] ED from home after an unwitnessed fall out of bed. Pt presents with minimally displaced fracture involving the surgical neck of the proximal left humerus, and Acute nondisplaced fracture involving posterior wall of the acetabulum with extension of lucency to the articular surface (non-surgical management for both). Medical history significant for chronic hyponatremia, permanent A. fib on Eliquis, chronic diastolic CHF, COPD, type 2 diabetes, GERD, hypertension, hyperlipidemia, spinal stenosis   Clinical Impression   Sherri was evaluated s/p the above fall and related fractures. PTA pt was indep in all ADL/IADLs and works as a Surveyor, minerals. She lives in a level entry home with her wife and daughters. Upon evaluation pt was overall max A +2 for all bed mobility and lateral scoot transfer. Pt is also Max A overall for ADLs for LUE and LLE management. Conservatively we followed post hip precautions this session and no ROM  of LUE or WB through L extremities. Pt benefits from continued OT acutely to progress ADLs and mobility. Recommend CIR level therapy at discharge to maximize indep and function prior to dc home.     Follow Up Recommendations  CIR    Equipment Recommendations  Other (comment) (TBD)    Recommendations for Other Services Rehab consult     Precautions / Restrictions Precautions Precautions: Fall;Posterior Hip Precaution Comments: posterior hip precautions reviewed given acetabular fx Required Braces or Orthoses: Sling Restrictions Weight Bearing Restrictions: Yes LUE Weight Bearing: Non weight bearing LLE Weight Bearing: Touchdown weight bearing      Mobility Bed Mobility Overal bed mobility: Needs Assistance Bed Mobility: Supine to Sit     Supine to sit: Max  assist;+2 for physical assistance     General bed mobility comments: max +2 for helicopter method to EOB, avoiding LUE given pain and NWB and maintaining LLE hip precautions. Verbal cuing for step-by-step sequencing. Boost to EOB with bed pads.    Transfers Overall transfer level: Needs assistance Equipment used: 2 person hand held assist Transfers: Lateral/Scoot Transfers          Lateral/Scoot Transfers: Max assist;+2 safety/equipment;+2 physical assistance;From elevated surface General transfer comment: max +2 for lateral scoot pivot to drop arm recliner towards R, step-wise scoot with bed pads and pt offweighting. PT and OT reinforcing TDWB LLE during transfer.    Balance Overall balance assessment: Needs assistance Sitting-balance support: Feet supported;Single extremity supported Sitting balance-Leahy Scale: Fair               ADL either performed or assessed with clinical judgement   ADL Overall ADL's : Needs assistance/impaired Eating/Feeding: Set up;Sitting   Grooming: Wash/dry hands;Wash/dry face;Oral care;Applying deodorant;Brushing hair;Set up;Sitting   Upper Body Bathing: Moderate assistance;Sitting;Cueing for compensatory techniques   Lower Body Bathing: Maximal assistance;+2 for physical assistance;Sitting/lateral leans Lower Body Bathing Details (indicate cue type and reason): total A for sling Upper Body Dressing : Moderate assistance;Cueing for compensatory techniques;Sitting   Lower Body Dressing: Maximal assistance;+2 for physical assistance;Sitting/lateral leans   Toilet Transfer: Maximal assistance;+2 for physical assistance;BSC Toilet Transfer Details (indicate cue type and reason): lateral scoot, drop arm         Functional mobility during ADLs: Maximal assistance;+2 for physical assistance (lateral scoot transfers)        Pertinent Vitals/Pain Pain Assessment: Faces Faces Pain Scale: Hurts whole lot  Pain Location: LLE, LUE Pain  Descriptors / Indicators: Constant;Guarding;Grimacing;Sore Pain Intervention(s): Limited activity within patient's tolerance;RN gave pain meds during session     Hand Dominance Left   Extremity/Trunk Assessment Upper Extremity Assessment Upper Extremity Assessment: LUE deficits/detail LUE Deficits / Details: proximal L humerus fx LUE: Unable to fully assess due to pain;Unable to fully assess due to immobilization LUE Sensation: WNL LUE Coordination: decreased fine motor;decreased gross motor   Lower Extremity Assessment Lower Extremity Assessment: Defer to PT evaluation LLE Deficits / Details: very limited by pain; able to perform ankle pumps, quad set, hip abd/add (no adduction past midline) but vocalizing in pain throughout LLE: Unable to fully assess due to pain   Cervical / Trunk Assessment Cervical / Trunk Assessment: Normal   Communication Communication Communication: No difficulties   Cognition Arousal/Alertness: Awake/alert Behavior During Therapy: WFL for tasks assessed/performed Overall Cognitive Status: Within Functional Limits for tasks assessed         General Comments  BP 113/50, SpO2 between 89%-96% on RA, 99% with 1L nasal canual            Home Living Family/patient expects to be discharged to:: Private residence Living Arrangements: Spouse/significant other;Children Available Help at Discharge: Family;Available 24 hours/day Type of Home: Other(Comment) Home Access: Level entry     Home Layout: One level     Bathroom Shower/Tub: Tub/shower unit;Walk-in shower   Bathroom Toilet: Standard Bathroom Accessibility: Yes How Accessible: Accessible via walker Home Equipment: Shower seat;Walker - 2 wheels;Walker - standard;Crutches;Cane - single point;Bedside commode          Prior Functioning/Environment Level of Independence: Independent        Comments: pediatric SLP        OT Problem List: Decreased strength;Decreased range of  motion;Decreased activity tolerance;Impaired balance (sitting and/or standing);Decreased safety awareness;Decreased knowledge of use of DME or AE;Decreased knowledge of precautions;Impaired UE functional use;Pain      OT Treatment/Interventions: Self-care/ADL training;Therapeutic exercise;DME and/or AE instruction;Therapeutic activities;Patient/family education;Balance training    OT Goals(Current goals can be found in the care plan section) Acute Rehab OT Goals Patient Stated Goal: home to family OT Goal Formulation: With patient Time For Goal Achievement: 03/15/21 Potential to Achieve Goals: Fair ADL Goals Pt Will Perform Grooming: with set-up;sitting Pt Will Perform Upper Body Bathing: with set-up;sitting Pt Will Perform Lower Body Bathing: with min assist;sitting/lateral leans Pt Will Perform Upper Body Dressing: with set-up;sitting Pt Will Perform Lower Body Dressing: with min assist;sitting/lateral leans Pt Will Transfer to Toilet: with min assist;bedside commode;squat pivot transfer  OT Frequency: Min 2X/week       Co-evaluation PT/OT/SLP Co-Evaluation/Treatment: Yes Reason for Co-Treatment: For patient/therapist safety;To address functional/ADL transfers;Complexity of the patient's impairments (multi-system involvement) PT goals addressed during session: Mobility/safety with mobility;Balance OT goals addressed during session: ADL's and self-care;Strengthening/ROM      AM-PAC OT "6 Clicks" Daily Activity     Outcome Measure Help from another person eating meals?: A Little Help from another person taking care of personal grooming?: A Little Help from another person toileting, which includes using toliet, bedpan, or urinal?: A Lot Help from another person bathing (including washing, rinsing, drying)?: A Lot Help from another person to put on and taking off regular upper body clothing?: A Lot Help from another person to put on and taking off regular lower body clothing?: A  Lot 6 Click Score: 14   End of Session Equipment Utilized During Treatment: Oxygen Nurse Communication: Mobility status;Precautions;Weight bearing status (transfer technique)  Activity  Tolerance: Patient limited by pain;Patient tolerated treatment well Patient left: in chair;with call bell/phone within reach;with chair alarm set  OT Visit Diagnosis: Unsteadiness on feet (R26.81);Other abnormalities of gait and mobility (R26.89);Muscle weakness (generalized) (M62.81);History of falling (Z91.81);Pain                Time: 4496-7591 OT Time Calculation (min): 45 min Charges:  OT General Charges $OT Visit: 1 Visit OT Evaluation $OT Eval Moderate Complexity: 1 Mod OT Treatments $Therapeutic Activity: 8-22 mins   Bufford Helms A Dalary Hollar 03/01/2021, 11:17 AM

## 2021-03-01 NOTE — Consult Note (Signed)
ORTHOPAEDIC CONSULTATION  REQUESTING PHYSICIAN: Kayleen Memos, DO  Chief Complaint: left proximal humerus fracture and left acetabular fracture  HPI: [name] is a 70 y.o. female with medical history significant for chronic hyponatremia, permanent A. fib on Eliquis, chronic diastolic CHF, COPD, type 2 diabetes, GERD, hypertension, hyperlipidemia, spinal stenosis who presented to [facility] ED from Home after an unwitnessed fall on the day of presentation. She apparently fell out of bed during the night. She complained of left shoulder and hip pain. Work-up in the ED was significant for minimally displaced proximal humerus fracture and nondisplaced fracture of the posterior wall of the acetabulum. She was admitted to the hospitalist service due to hypoxia and PT/OT consultation.   Patient seen on 5N. She states pain is controlled when she is not moving around. She lives at home with her wife, 2 kids, and 4 animals. She normally ambulates without any assistance. She is left handed. This is her first broken bone. She is a Electrical engineer. Still working.   No past medical history on file.  Social History   Socioeconomic History   Marital status: Married    Spouse name: Not on file   Number of children: Not on file   Years of education: Not on file   Highest education level: Not on file  Occupational History   Not on file  Tobacco Use   Smoking status: Not on file   Smokeless tobacco: Not on file  Substance and Sexual Activity   Alcohol use: Not on file   Drug use: Not on file   Sexual activity: Not on file  Other Topics Concern   Not on file  Social History Narrative   Not on file   Social Determinants of Health   Financial Resource Strain: Not on file  Food Insecurity: Not on file  Transportation Needs: Not on file  Physical Activity: Not on file  Stress: Not on file  Social Connections: Not on file   No family history on file. Allergies  Allergen Reactions    Benadryl [Diphenhydramine] Anxiety   Prior to Admission medications   Medication Sig Start Date End Date Taking? Authorizing Provider  celecoxib (CELEBREX) 200 MG capsule Take 200 mg by mouth daily. 01/05/21   [provider]  diltiazem (CARDIZEM CD) 180 MG 24 hr capsule Take 180 mg by mouth daily. 12/20/20   [provider]  famotidine (PEPCID) 20 MG tablet Take 20 mg by mouth 2 (two) times daily. 02/20/21   [provider]  INVOKANA 100 MG TABS tablet Take 100 mg by mouth every morning. 02/24/21   [provider]  latanoprost (XALATAN) 0.005 % ophthalmic solution 1 drop at bedtime. 02/07/21   [provider]  LORazepam (ATIVAN) 0.5 MG tablet Take 0.5-1 mg by mouth at bedtime. 02/01/21   [provider]  metoprolol succinate (TOPROL-XL) 100 MG 24 hr tablet Take 100 mg by mouth 2 (two) times daily. 02/24/21   [provider]  metoprolol tartrate (LOPRESSOR) 25 MG tablet Take 25 mg by mouth daily as needed. 02/24/21   [provider]  PARoxetine (PAXIL) 10 MG tablet Take 25 mg by mouth daily. 02/24/21   [provider]  potassium chloride (MICRO-K) 10 MEQ CR capsule Take 10 mEq by mouth 2 (two) times daily. 02/20/21   [provider]  quinapril (ACCUPRIL) 40 MG tablet Take 40 mg by mouth daily. 12/12/20   [provider]  rosuvastatin (CRESTOR) 10 MG tablet  Take 10 mg by mouth at bedtime. 01/06/21   [provider]  torsemide (DEMADEX) 20 MG tablet Take 40 mg by mouth 2 (two) times daily. 11/30/20   [provider]  traZODone (DESYREL) 100 MG tablet Take 50-100 mg by mouth at bedtime. 01/31/21   [provider]  XARELTO 20 MG TABS tablet Take 20 mg by mouth daily. 02/01/21   [provider]   DG Lumbar Spine Complete  Result Date: 02/28/2021 CLINICAL DATA:  Fall out of bed this morning. Low back pain. Initial encounter. EXAM: LUMBAR SPINE - COMPLETE 4+ VIEW COMPARISON:   09/23/2020 FINDINGS: There is no evidence of lumbar spine fracture. Alignment is normal. Severe degenerative disc disease is seen at L5-S1. Mild degenerative disc disease is seen at L2-3 and L3-4. No evidence of facet DJD or other significant bone abnormality. Aortic atherosclerotic calcification noted. No significant change is seen compared to prior study. IMPRESSION: No acute findings. Degenerative spondylosis, as described above. Electronically Signed   By: Marlaine Hind M.D.   On: 02/28/2021 13:45   CT Head Wo Contrast  Result Date: 02/28/2021 CLINICAL DATA:  Patient fell and struck her head. EXAM: CT HEAD WITHOUT CONTRAST TECHNIQUE: Contiguous axial images were obtained from the base of the skull through the vertex without intravenous contrast. COMPARISON:  03/07/2020 FINDINGS: Brain: There is no evidence for acute hemorrhage, hydrocephalus, mass lesion, or abnormal extra-axial fluid collection. No definite CT evidence for acute infarction. Diffuse loss of parenchymal volume is consistent with atrophy. Patchy low attenuation in the deep hemispheric and periventricular white matter is nonspecific, but likely reflects chronic microvascular ischemic demyelination. Vascular: No hyperdense vessel or unexpected calcification. Skull: No evidence for fracture. No worrisome lytic or sclerotic lesion. Surgical changes noted right mastoid region. Sinuses/Orbits: The visualized paranasal sinuses and mastoid air cells are clear. Visualized portions of the globes and intraorbital fat are unremarkable. Other: None. IMPRESSION: 1. No acute intracranial abnormality. 2. Atrophy with chronic small vessel white matter ischemic disease. Electronically Signed   By: Misty Stanley M.D.   On: 02/28/2021 12:50   CT Shoulder Left Wo Contrast  Result Date: 02/28/2021 CLINICAL DATA:  Proximal humeral fracture. EXAM: CT OF THE UPPER LEFT EXTREMITY WITHOUT CONTRAST TECHNIQUE: Multidetector CT imaging of the upper left extremity was  performed according to the standard protocol. COMPARISON:  Radiograph of same day. FINDINGS: Exam is somewhat limited due to persistent patient motion artifact. There does appear to be a minimally displaced fracture involving the surgical neck of the proximal left humerus. Probable minimally displaced fracture is seen involving greater tuberosity of proximal left humerus. The glenoid, clavicle and visualized ribs are unremarkable. Probable edema or contusion is seen involving the surrounding subcutaneous tissues. IMPRESSION: Limited exam due to persistent patient motion artifact. Minimally displaced fracture is seen involving the surgical neck of the proximal left humerus, with probable minimally displaced fracture involving the greater tuberosity of proximal left humerus. Electronically Signed   By: Marijo Conception M.D.   On: 02/28/2021 19:56   CT Hip Left Wo Contrast  Result Date: 02/28/2021 CLINICAL DATA:  Hip trauma fall EXAM: CT OF THE LEFT HIP WITHOUT CONTRAST TECHNIQUE: Multidetector CT imaging of the left hip was performed according to the standard protocol. Multiplanar CT image reconstructions were also generated. COMPARISON:  Radiograph 02/28/2021 FINDINGS: Bones/Joint/Cartilage Acute nondisplaced fracture involving the posterior wall of the acetabulum with articular surface extension. Pubic symphysis and rami appear intact. No femoral head dislocation. Sclerosis within the femoral head  and neck. Ligaments Suboptimally assessed by CT. Muscles and Tendons Mild atrophy of left gluteus.  No intramuscular calcifications. Soft tissues Edema within the subcutaneous soft tissues of the posterior hip. IMPRESSION: 1. Acute nondisplaced fracture involving posterior wall of the acetabulum with extension of lucency to the articular surface. No proximal femoral fracture is visualized. 2. Heterogeneous sclerosis within the left femoral head and neck without femoral head deformity or collapse. Electronically Signed    By: Donavan Foil M.D.   On: 02/28/2021 18:19   DG Chest Port 1 View  Result Date: 02/28/2021 CLINICAL DATA:  Pt arrived via [facility] with c/c of Fall. Per EMS pt is on xeralto. Pt is from home, where EMS was sitting in floor beside bed where she was sleeping and rolled out of bed. Pt arrives with L shoulder and hip pain EXAM: PORTABLE CHEST - 1 VIEW COMPARISON:  11/03/2020 FINDINGS: Relatively low lung volumes. Chronic linear scarring at the left lung base. No new infiltrate or overt edema. Heart size and mediastinal contours are within normal limits. Aortic Atherosclerosis (ICD10-170.0). No effusion.  No pneumothorax. Left humeral neck fracture. IMPRESSION: 1. No acute cardiopulmonary disease. 2. Left humeral neck fracture Electronically Signed   By: Lucrezia Europe M.D.   On: 02/28/2021 12:24   DG Shoulder Left Portable  Result Date: 02/28/2021 CLINICAL DATA:  Pain post fall out of bed EXAM: LEFT SHOULDER COMPARISON:  None. FINDINGS: Minimally displaced fracture across the surgical neck of the humerus involving the greater tuberosity. No dislocation. No significant angulation deformity a or distraction. IMPRESSION: Minimally displaced fracture across the surgical neck of the humerus and greater tuberosity. Electronically Signed   By: Lucrezia Europe M.D.   On: 02/28/2021 12:26   DG Hip Unilat W or Wo Pelvis 2-3 Views Left  Result Date: 02/28/2021 CLINICAL DATA:  Fall. EXAM: DG HIP (WITH OR WITHOUT PELVIS) 2-3V LEFT COMPARISON:  09/23/2020. FINDINGS: Diffuse osteopenia and degenerative change. No evidence of fracture or dislocation. Sclerotic changes about both femoral heads. Avascular necrosis cannot be excluded. Aortoiliac and peripheral vascular calcification. IMPRESSION: 1. Diffuse osteopenia degenerative change. No evidence of fracture dislocation. 2. Sclerotic changes both femoral heads. Avascular necrosis cannot be excluded. 3.  Peripheral vascular disease. Electronically Signed   By: Marcello Moores  Register   On:  02/28/2021 13:44   Family History Reviewed and non-contributory, no pertinent history of problems with bleeding or anesthesia      Review of Systems 14 system ROS conducted and negative except for that noted in HPI   OBJECTIVE  Vitals:Patient Vitals for the past 8 hrs:  BP Temp Pulse Resp SpO2  02/28/21 2242 110/75 98.8 F (37.1 C) 100 18 96 %   General: Alert, no acute distress Cardiovascular: Warm extremities noted Respiratory: No cyanosis, no use of accessory musculature GI: No organomegaly, abdomen is soft and non-tender Skin: No lesions in the area of chief complaint other than those listed below in MSK exam.  Neurologic: Sensation intact distally save for the below mentioned MSK exam Psychiatric: Patient is competent for consent with normal mood and affect Lymphatic: No swelling obvious and reported other than the area involved in the exam below  Extremities  RUE: actively moves right upper extremity without pain. Non-tender to palpation right shoulder, elbow, forearm, wrist, hand. Endorses distal sensation. Warm well perfused hand.   ATF:TDDUKG to palpation left shoulder. Tenderness to palpation left elbow. No tenderness to palpation left wrist and hand. ROM not tested in setting of known fracture. Sling well  fitting. Swelling and ecchymosis left upper arm. Endorses axillary nerve sensation which is symmetric and preserved. Wiggles her fingers slightly, but has pain in the left shoulder when doing so. Endorses distal sensation.Warm and well perfused digits. Compartments soft and compressible.   RLE: actively moves right lower extremity without pain. Non-tender to palpation right hip, knee, ankle, foot. No swelling, deformity, or effusion. Skin intact. + GS/TA/EHL. Sensation intact in DP/SP/S/S/P distributions. Warm and well perfused digits. Compartments soft and compressible, with no pain on passive stretch.  LLE: tender to palpation left hip. ROM not tested in setting of known  fracture. Non-tender to palpation left knee, left ankle, left foot. Endorses sensation. + GS/TA/EHL. Warm and well perfused digits. Compartments soft and compressible, with no pain on passive stretch.    Test Results Imaging X-ray and CT of the left shoulder demonstrates minimally displaced fracture of the surgical neck and greater tuberosity.   CT of the left hip demonstrates nondisplaced acetabular fracture involving the posterior wall with extension to the articular surface.  Labs cbc Recent Labs    02/28/21 1136 03/01/21 0018  WBC 7.5 11.4*  HGB 11.2* 9.3*  HCT 32.3* 27.8*  PLT 212 209    Labs inflam No results for input(s): CRP in the last 72 hours.  Invalid input(s): ESR  Labs coag No results for input(s): INR, PTT in the last 72 hours.  Invalid input(s): PT  Recent Labs    02/28/21 1136  NA 126*  K 4.5  CL 91*  CO2 26  GLUCOSE 183*  BUN 21  CREATININE 1.20*  CALCIUM 9.3     ASSESSMENT AND PLAN: 70 y.o. female with the following: Left minimally displaced proximal humerus fracture and left nondisplaced fracture of the acetabulum.   Patient has been admitted to the medical service due to hypoxia, discharge planning due to multiple injuries, PT/OT consultation. Orthopedics to follow along.   Discussed with the patient that both injuries can be treated non-operatively. However since she has both lower and upper extremity injuries that require her to be non-weight bearing, there will be difficulty in getting her to mobilize. May require surgical intervention to help with earlier mobilization. We will consult with Dr. Doreatha Martin, orthopedic traumatologist, to get his opinion on this case.   - Weight Bearing Status/Activity:  - Left upper extremity: NWB in sling - Left lower extremity: Touchdown weight bearing  - Additional recommended labs/tests: PT/OT consultation - VTE Prophylaxis: per medicine team - Pain control: per medicine team - Follow-up plan:  TBD  Noemi Chapel, PA-C 03/01/2021

## 2021-03-01 NOTE — Progress Notes (Signed)
Inpatient Rehab Admissions Coordinator Note:   Per therapy recommendations, pt was screened for CIR candidacy by Shann Medal, PT, DPT.  [employer] unlikely to approve for CIR given no operative management and NWB x2 limbs.  Will follow from a distance for recommendations from Dr. Doreatha Martin, no consult at this time.    Please contact me with questions.   Shann Medal, PT, DPT [phone] 03/01/21 3:32 PM

## 2021-03-01 NOTE — Plan of Care (Signed)

## 2021-03-01 NOTE — Progress Notes (Signed)
Patient admitted to the hospital earlier this morning by Dr. Nevada Crane  Patient seen and examined. Currently sitting up in chair. Reports that pain is controlled when she is not moving, but continues to have significant pain with any movement.  A/P:  Proximal left humerus fracture, Left acetabular fracture.  -seen by ortho -plans to discuss with trauma specialist -seen by OT with recommendations for CIR -continue pain management for now, await further recs from ortho  Permanent A fib -chronically on xarelto, but held for now in case patient needs operative management -she is chronically on diltiazem and metoprolol for rate control that were held on admission due to soft bp  -will resume toprol at reduced dose, titrate back up as tolerated  Hyponatremia -appears to be euvolemic -TSH and cortisol normal -continue on saline for now and monitor  DM2 -A1c 7.1 -chronically on jamumet and invokana -continue on SSI -blood sugars stable  Chronic anxiety/depresison -continue on paxil -will have to potentially hold if sodium does not improve  Restless leg syndrome -continue on requip  HLD -continue on statin  Chronic diastolic chf -currently appears compensated -holding diuretics in light of hyponatremia  COPD -no wheezing or shortness of breath at this time -currently on RA -continue on Anoro   Discussed with family at the bedside  [facility]

## 2021-03-01 NOTE — H&P (Addendum)
History and Physical  [name] IRJ:[id number] DOB: March 30, 1951 DOA: 02/28/2021  Referring physician: Dr. Sherry Ruffing, [place]. PCP: Hoyt Koch, MD  Outpatient Specialists: Cardiology. Patient coming from: Home.  Chief Complaint: Fall  HPI: [name] is a 70 y.o. female with medical history significant for chronic hyponatremia, permanent A. fib on Eliquis, chronic diastolic CHF, COPD, type 2 diabetes, GERD, hypertension, hyperlipidemia, spinal stenosis who presented to [facility] ED from Home after an unwitnessed fall on the day of presentation.  She lives at home with her spouse who provides majority of the history.  She reports hypersomnolence during the day with concern for polypharmacy.  No prior history of OSA, not on CPAP.  Reports that she was in the process of being on oxygen supplementation 2 L via nasal cannula QHS.  EMS was activated.  EMS reports that she was sitting in the floor on the floor beside bed when she was sleeping and rolled out of bed.  Associated left shoulder and hip pain.  Was brought to the ED for further evaluation.  Work-up revealed: Minimallydisplaced fracture is seen involving the surgical neck of the proximal left humerus, with probable minimally displaced fracture involving the greater tuberosity of proximal left humerus. 1. Acute nondisplaced fracture involving posterior wall of the acetabulum with extension of lucency to the articular surface. No proximal femoral fracture is visualized. 2. Heterogeneous sclerosis within the left femoral head and neck without femoral head deformity or collapse.  Orthopedic surgery was consulted by EDP.  TRH, hospitalist team, was asked to admit.  ED Course: Temperature 98.8.  BP 110/75, pulse 100, respiration rate 18, O2 saturation 96% on 2 L.  Lab studies significant for serum sodium 126, potassium 4.5, serum bicarb 26, glucose 23, BUN 21, creatinine 1.20, GFR 48, LFTs normal.  WBC 7.5, hemoglobin 11.2, hematocrit 32.3, MCV  87, platelet count 212.  Review of Systems: Review of systems as noted in the HPI. All other systems reviewed and are negative.  Past medical history: Pulmonary issue, chronic diastolic CHF, COPD, type 2 diabetes, GERD.  Social History:  has no history on file for tobacco use, alcohol use, and drug use.   Allergies  Allergen Reactions   Benadryl [Diphenhydramine] Anxiety   Family history: Mother, history of emphysema and ulcers unspecified.  Prior to Admission medications   Medication Sig Start Date End Date Taking? Authorizing Provider  celecoxib (CELEBREX) 200 MG capsule Take 200 mg by mouth daily. 01/05/21   [provider]  diltiazem (CARDIZEM CD) 180 MG 24 hr capsule Take 180 mg by mouth daily. 12/20/20   [provider]  famotidine (PEPCID) 20 MG tablet Take 20 mg by mouth 2 (two) times daily. 02/20/21   [provider]  INVOKANA 100 MG TABS tablet Take 100 mg by mouth every morning. 02/24/21   [provider]  latanoprost (XALATAN) 0.005 % ophthalmic solution 1 drop at bedtime. 02/07/21   [provider]  LORazepam (ATIVAN) 0.5 MG tablet Take 0.5-1 mg by mouth at bedtime. 02/01/21   [provider]  metoprolol succinate (TOPROL-XL) 100 MG 24 hr tablet Take 100 mg by mouth 2 (two) times daily. 02/24/21   [provider]  metoprolol tartrate (LOPRESSOR) 25 MG tablet Take 25 mg by mouth daily as needed. 02/24/21   [provider]  PARoxetine (PAXIL) 10 MG tablet Take 25 mg by mouth daily. 02/24/21   [provider]  potassium chloride (MICRO-K) 10 MEQ CR capsule Take 10 mEq by mouth  2 (two) times daily. 02/20/21   [provider]  quinapril (ACCUPRIL) 40 MG tablet Take 40 mg by mouth daily. 12/12/20   [provider]  rosuvastatin (CRESTOR) 10 MG tablet Take 10 mg by mouth at bedtime. 01/06/21   [provider]  torsemide (DEMADEX) 20 MG tablet Take 40 mg by mouth 2 (two) times daily.  11/30/20   [provider]  traZODone (DESYREL) 100 MG tablet Take 50-100 mg by mouth at bedtime. 01/31/21   [provider]  XARELTO 20 MG TABS tablet Take 20 mg by mouth daily. 02/01/21   [provider]    Physical Exam: BP 110/75 (BP Location: Right Arm)   Pulse 100   Temp 98.8 F (37.1 C)   Resp 18   Ht 5\' 2"  (1.575 m)   Wt 81.6 kg   SpO2 96%   BMI 32.92 kg/m   General: 70 y.o. year-old female well developed well nourished in no acute distress.  Somnolent but arousable to voices. Cardiovascular: Regular rate and rhythm with no rubs or gallops.  No thyromegaly or JVD noted.  No lower extremity edema. 2/4 pulses in all 4 extremities. Respiratory: Clear to auscultation with no wheezes or rales. Good inspiratory effort. Abdomen: Soft nontender nondistended with normal bowel sounds x4 quadrants. Muskuloskeletal: No cyanosis, clubbing or edema noted bilaterally Neuro: CN II-XII intact, strength, sensation, reflexes Skin: No ulcerative lesions noted or rashes Psychiatry: Judgement and insight appear altered.  Unable to assess mood due to somnolence.         Labs on Admission:  Basic Metabolic Panel: Recent Labs  Lab 02/28/21 1136  NA 126*  K 4.5  CL 91*  CO2 26  GLUCOSE 183*  BUN 21  CREATININE 1.20*  CALCIUM 9.3   Liver Function Tests: Recent Labs  Lab 02/28/21 1136  AST 25  ALT 16  ALKPHOS 55  BILITOT 0.8  PROT 7.5  ALBUMIN 4.3   No results for input(s): LIPASE, AMYLASE in the last 168 hours. No results for input(s): AMMONIA in the last 168 hours. CBC: Recent Labs  Lab 02/28/21 1136  WBC 7.5  NEUTROABS 5.8  HGB 11.2*  HCT 32.3*  MCV 87.1  PLT 212   Cardiac Enzymes: No results for input(s): CKTOTAL, CKMB, CKMBINDEX, TROPONINI in the last 168 hours.  BNP (last 3 results) No results for input(s): BNP in the last 8760 hours.  ProBNP (last 3 results) No results for input(s): PROBNP in the last 8760 hours.  CBG: Recent Labs   Lab 02/28/21 2314  GLUCAP 137*    Radiological Exams on Admission: DG Lumbar Spine Complete  Result Date: 02/28/2021 CLINICAL DATA:  Fall out of bed this morning. Low back pain. Initial encounter. EXAM: LUMBAR SPINE - COMPLETE 4+ VIEW COMPARISON:  09/23/2020 FINDINGS: There is no evidence of lumbar spine fracture. Alignment is normal. Severe degenerative disc disease is seen at L5-S1. Mild degenerative disc disease is seen at L2-3 and L3-4. No evidence of facet DJD or other significant bone abnormality. Aortic atherosclerotic calcification noted. No significant change is seen compared to prior study. IMPRESSION: No acute findings. Degenerative spondylosis, as described above. Electronically Signed   By: Marlaine Hind M.D.   On: 02/28/2021 13:45   CT Head Wo Contrast  Result Date: 02/28/2021 CLINICAL DATA:  Patient fell and struck her head. EXAM: CT HEAD WITHOUT CONTRAST TECHNIQUE: Contiguous axial images were obtained from the base of the skull through the vertex without intravenous contrast. COMPARISON:  03/07/2020  FINDINGS: Brain: There is no evidence for acute hemorrhage, hydrocephalus, mass lesion, or abnormal extra-axial fluid collection. No definite CT evidence for acute infarction. Diffuse loss of parenchymal volume is consistent with atrophy. Patchy low attenuation in the deep hemispheric and periventricular white matter is nonspecific, but likely reflects chronic microvascular ischemic demyelination. Vascular: No hyperdense vessel or unexpected calcification. Skull: No evidence for fracture. No worrisome lytic or sclerotic lesion. Surgical changes noted right mastoid region. Sinuses/Orbits: The visualized paranasal sinuses and mastoid air cells are clear. Visualized portions of the globes and intraorbital fat are unremarkable. Other: None. IMPRESSION: 1. No acute intracranial abnormality. 2. Atrophy with chronic small vessel white matter ischemic disease. Electronically Signed   By: Misty Stanley M.D.   On: 02/28/2021 12:50   CT Shoulder Left Wo Contrast  Result Date: 02/28/2021 CLINICAL DATA:  Proximal humeral fracture. EXAM: CT OF THE UPPER LEFT EXTREMITY WITHOUT CONTRAST TECHNIQUE: Multidetector CT imaging of the upper left extremity was performed according to the standard protocol. COMPARISON:  Radiograph of same day. FINDINGS: Exam is somewhat limited due to persistent patient motion artifact. There does appear to be a minimally displaced fracture involving the surgical neck of the proximal left humerus. Probable minimally displaced fracture is seen involving greater tuberosity of proximal left humerus. The glenoid, clavicle and visualized ribs are unremarkable. Probable edema or contusion is seen involving the surrounding subcutaneous tissues. IMPRESSION: Limited exam due to persistent patient motion artifact. Minimally displaced fracture is seen involving the surgical neck of the proximal left humerus, with probable minimally displaced fracture involving the greater tuberosity of proximal left humerus. Electronically Signed   By: Marijo Conception M.D.   On: 02/28/2021 19:56   CT Hip Left Wo Contrast  Result Date: 02/28/2021 CLINICAL DATA:  Hip trauma fall EXAM: CT OF THE LEFT HIP WITHOUT CONTRAST TECHNIQUE: Multidetector CT imaging of the left hip was performed according to the standard protocol. Multiplanar CT image reconstructions were also generated. COMPARISON:  Radiograph 02/28/2021 FINDINGS: Bones/Joint/Cartilage Acute nondisplaced fracture involving the posterior wall of the acetabulum with articular surface extension. Pubic symphysis and rami appear intact. No femoral head dislocation. Sclerosis within the femoral head and neck. Ligaments Suboptimally assessed by CT. Muscles and Tendons Mild atrophy of left gluteus.  No intramuscular calcifications. Soft tissues Edema within the subcutaneous soft tissues of the posterior hip. IMPRESSION: 1. Acute nondisplaced fracture involving  posterior wall of the acetabulum with extension of lucency to the articular surface. No proximal femoral fracture is visualized. 2. Heterogeneous sclerosis within the left femoral head and neck without femoral head deformity or collapse. Electronically Signed   By: Donavan Foil M.D.   On: 02/28/2021 18:19   DG Chest Port 1 View  Result Date: 02/28/2021 CLINICAL DATA:  Pt arrived via [facility] with c/c of Fall. Per EMS pt is on xeralto. Pt is from home, where EMS was sitting in floor beside bed where she was sleeping and rolled out of bed. Pt arrives with L shoulder and hip pain EXAM: PORTABLE CHEST - 1 VIEW COMPARISON:  11/03/2020 FINDINGS: Relatively low lung volumes. Chronic linear scarring at the left lung base. No new infiltrate or overt edema. Heart size and mediastinal contours are within normal limits. Aortic Atherosclerosis (ICD10-170.0). No effusion.  No pneumothorax. Left humeral neck fracture. IMPRESSION: 1. No acute cardiopulmonary disease. 2. Left humeral neck fracture Electronically Signed   By: Lucrezia Europe M.D.   On: 02/28/2021 12:24   DG Shoulder Left Portable  Result Date:  02/28/2021 CLINICAL DATA:  Pain post fall out of bed EXAM: LEFT SHOULDER COMPARISON:  None. FINDINGS: Minimally displaced fracture across the surgical neck of the humerus involving the greater tuberosity. No dislocation. No significant angulation deformity a or distraction. IMPRESSION: Minimally displaced fracture across the surgical neck of the humerus and greater tuberosity. Electronically Signed   By: Lucrezia Europe M.D.   On: 02/28/2021 12:26   DG Hip Unilat W or Wo Pelvis 2-3 Views Left  Result Date: 02/28/2021 CLINICAL DATA:  Fall. EXAM: DG HIP (WITH OR WITHOUT PELVIS) 2-3V LEFT COMPARISON:  09/23/2020. FINDINGS: Diffuse osteopenia and degenerative change. No evidence of fracture or dislocation. Sclerotic changes about both femoral heads. Avascular necrosis cannot be excluded. Aortoiliac and peripheral vascular  calcification. IMPRESSION: 1. Diffuse osteopenia degenerative change. No evidence of fracture dislocation. 2. Sclerotic changes both femoral heads. Avascular necrosis cannot be excluded. 3.  Peripheral vascular disease. Electronically Signed   By: Marcello Moores  Register   On: 02/28/2021 13:44    EKG: I independently viewed the EKG done and my findings are as followed: None available at the time of this visit.  Assessment/Plan Present on Admission:  Fall  Active Problems:   Fall  Fall, unwitnessed. Her wife is concerned about polypharmacy. Judicious opiate use Bony fractures or significant cough. PT OT assessment Fall precautions  Proximal left humerus fracture Left acetabular fracture, seen on CT scan post unwitnessed fall at home. Orthopedic surgery consulted by EDP Pain control and bowel regimen PT OT evaluation with orthopedic surgery's guidance Fall precautions  Permanent A. fib on Xarelto Xarelto on hold due to fractures On pharmacological DVT prophylaxis Lovenox subcu daily Defer to orthopedic surgery on when to restart DOAC. Home metoprolol on hold due to soft blood pressures to avoid hypotension Continue to monitor on telemetry  Euvolemic hyponatremia Obtain urine lites and serum osmolality Obtain TSH and random cortisol level. Started gentle IV fluid hydration normal saline at 75 cc/h Repeat serum sodium in the morning, no more than 10 mEq correction within 24 hours.  Type 2 diabetes with hyperglycemia Obtain A1c Insulin sliding scale  Chronic anxiety/depression Patient is on paroxetine  Restless leg syndrome Med rec not completed Per his wife at bedside, he is on Requip  Concern for polypharmacy Her wife expressed concern for polypharmacy Review home medications prior to DC.   DVT prophylaxis: Subcu Lovenox daily  Code Status: Full code  Family Communication: Wife at bedside.  Disposition Plan: Admit to telemetry surgical.  Consults called: Orthopedic  surgery consulted by EDP.  Admission status: Inpatient status.  Patient will require at least 2 midnights for further evaluation and treatment of present condition.   Status is: Inpatient    Dispo:  Patient From: Home  Planned Disposition: [place], possibly on 03/02/2021 or when orthopedic surgery signs off.  Medically stable for discharge: No         Kayleen Memos MD [facility] Pager [phone]  If 7PM-7AM, please contact night-coverage [email or web address] Password [facility]  03/01/2021, 4:38 AM

## 2021-03-02 ENCOUNTER — Other Ambulatory Visit: Payer: Self-pay | Admitting: Cardiovascular Disease

## 2021-03-02 DIAGNOSIS — J449 Chronic obstructive pulmonary disease, unspecified: Secondary | ICD-10-CM

## 2021-03-02 DIAGNOSIS — S42295A Other nondisplaced fracture of upper end of left humerus, initial encounter for closed fracture: Principal | ICD-10-CM

## 2021-03-02 LAB — CBC
HCT: 27.1 % — ABNORMAL LOW (ref 36.0–46.0)
Hemoglobin: 9.6 g/dL — ABNORMAL LOW (ref 12.0–15.0)
MCH: 30.3 pg (ref 26.0–34.0)
MCHC: 35.4 g/dL (ref 30.0–36.0)
MCV: 85.5 fL (ref 80.0–100.0)
Platelets: 166 10*3/uL (ref 150–400)
RBC: 3.17 MIL/uL — ABNORMAL LOW (ref 3.87–5.11)
RDW: 12.7 % (ref 11.5–15.5)
WBC: 11.4 10*3/uL — ABNORMAL HIGH (ref 4.0–10.5)
nRBC: 0 % (ref 0.0–0.2)

## 2021-03-02 LAB — BASIC METABOLIC PANEL
Anion gap: 9 (ref 5–15)
BUN: 13 mg/dL (ref 8–23)
CO2: 24 mmol/L (ref 22–32)
Calcium: 9 mg/dL (ref 8.9–10.3)
Chloride: 97 mmol/L — ABNORMAL LOW (ref 98–111)
Creatinine, Ser: 1.01 mg/dL — ABNORMAL HIGH (ref 0.44–1.00)
GFR, Estimated: 59 mL/min — ABNORMAL LOW (ref >60)
Glucose, Bld: 153 mg/dL — ABNORMAL HIGH (ref 70–99)
Potassium: 4.5 mmol/L (ref 3.5–5.1)
Sodium: 130 mmol/L — ABNORMAL LOW (ref 135–145)

## 2021-03-02 LAB — GLUCOSE, CAPILLARY
Glucose-Capillary: 152 mg/dL — ABNORMAL HIGH (ref 70–99)
Glucose-Capillary: 148 mg/dL — ABNORMAL HIGH (ref 70–99)
Glucose-Capillary: 165 mg/dL — ABNORMAL HIGH (ref 70–99)
Glucose-Capillary: 122 mg/dL — ABNORMAL HIGH (ref 70–99)

## 2021-03-02 MED ORDER — METOPROLOL SUCCINATE ER 50 MG PO TB24
100.0000 mg | ORAL_TABLET | Freq: Two times a day (BID) | ORAL | Status: DC
  Administered 2021-03-02 – 2021-03-07 (×10): 100 mg via ORAL
  Filled 2021-03-02 (×10): qty 2

## 2021-03-02 MED ORDER — METOPROLOL SUCCINATE ER 50 MG PO TB24: 50.0000 mg | ORAL_TABLET | Freq: Two times a day (BID) | ORAL | Status: DC

## 2021-03-02 MED ORDER — RIVAROXABAN 20 MG PO TABS
20.0000 mg | ORAL_TABLET | Freq: Every day | ORAL | Status: DC
  Administered 2021-03-02 – 2021-03-03 (×2): 20 mg via ORAL
  Filled 2021-03-02 (×3): qty 1

## 2021-03-02 MED ORDER — DILTIAZEM HCL ER COATED BEADS 180 MG PO CP24
180.0000 mg | ORAL_CAPSULE | Freq: Every day | ORAL | Status: DC
  Administered 2021-03-02 – 2021-03-07 (×6): 180 mg via ORAL
  Filled 2021-03-02 (×6): qty 1

## 2021-03-02 MED ORDER — METOPROLOL SUCCINATE ER 25 MG PO TB24
25.0000 mg | ORAL_TABLET | ORAL | Status: AC
  Administered 2021-03-02: 25 mg via ORAL
  Filled 2021-03-02: qty 1

## 2021-03-02 NOTE — TOC CAGE-AID Note (Signed)
Transition of Care [facility]) - CAGE-AID Screening   Patient Details  Name: [name] MRN: [id number] Date of Birth: 1951-04-23  [facility] CM/SW Contact:    Hanz Winterhalter C Tarpley-Carter, [place] Phone Number: 03/02/2021, 1:34 PM   Clinical Narrative: Pt participated in [place].  Pt stated she does not use substance or ETOH. Pt was offered resources, due to no usage of substance or ETOH.    Coraima Tibbs Tarpley-Carter, MSW, LCSW-A Pronouns:  She/Her/Hers [facility]itions [facility] Clinical Social Worker Direct Number:  [phone] [email or web address]   CAGE-AID Screening:    Have You Ever Felt You Ought to Cut Down on Your Drinking or Drug Use?: No Have People Annoyed You By [employer] Your Drinking Or Drug Use?: No Have You Felt Bad Or Guilty About Your Drinking Or Drug Use?: No Have You Ever Had a Drink or Used Drugs First Thing In The Morning to Steady Your Nerves or to Get Rid of a Hangover?: No CAGE-AID Score: 0  Substance Abuse Education Offered: No

## 2021-03-02 NOTE — Progress Notes (Signed)
Physical Therapy Treatment Patient Details Name: [name] MRN: [id number] DOB: 1951/03/29 Today's Date: 03/02/2021    History of Present Illness [name] is a 70 y.o. female who presented to [facility] ED from home after an unwitnessed fall out of bed. Pt presents with minimally displaced fracture involving the surgical neck of the proximal left humerus, and Acute nondisplaced fracture involving posterior wall of the acetabulum with extension of lucency to the articular surface (non-surgical management for both). Medical history significant for chronic hyponatremia, permanent A. fib on Eliquis, chronic diastolic CHF, COPD, type 2 diabetes, GERD, hypertension, hyperlipidemia, spinal stenosis    PT Comments    Pt expresses irritations to PT, including infrequent care, being soiled in urine, and lack of knowledge of timeline for d/c from hospital setting. PT provided encouragement and assisted pt in clean up due to soiled in urine. Pt overall requiring mod-max +2 assist for bed mobility and transfer to standing with use of stedy, PT reinforcing multiple precautions throughout. Pt remains motivated to progress mobility, CIR remains appropriate plan.     Follow Up Recommendations  CIR;Supervision for mobility/OOB     Equipment Recommendations  None recommended by PT    Recommendations for Other Services       Precautions / Restrictions Precautions Precautions: Fall;Posterior Hip Precaution Comments: posterior hip precautions reviewed given acetabular fx Required Braces or Orthoses: Sling Restrictions Weight Bearing Restrictions: Yes LUE Weight Bearing: Non weight bearing (sling adjusted upon PT arrival, pt's LUE out of sling) LLE Weight Bearing: Touchdown weight bearing    Mobility  Bed Mobility Overal bed mobility: Needs Assistance Bed Mobility: Supine to Sit     Supine to sit: Max assist;+2 for physical assistance     General bed mobility comments: max +2 for trunk  elevation, LE management via helicopter technique to EOB. Increased time, pt vocalizing in pain initially    Transfers Overall transfer level: Needs assistance Equipment used: Ambulation equipment used Transfers: Sit to/from Stand          Lateral/Scoot Transfers: Mod assist;+2 physical assistance General transfer comment: Mod +2 for power up from posterior trunk, rise, and steadying. PT reinforcing TDWB LLE throughout with LUE in sling throughout. STS x2, one from EOB and one from stedy seat.  Ambulation/Gait             General Gait Details: nt   Marine scientist Rankin (Stroke Patients Only)       Balance Overall balance assessment: Needs assistance Sitting-balance support: Feet supported;Single extremity supported Sitting balance-Leahy Scale: Fair     Standing balance support: During functional activity;Single extremity supported Standing balance-Leahy Scale: Poor Standing balance comment: reliant on external assist and physical support                            Cognition Arousal/Alertness: Awake/alert Behavior During Therapy: WFL for tasks assessed/performed Overall Cognitive Status: Within Functional Limits for tasks assessed                                 General Comments: pt irritated about care, states she is the "stepchild" of the unit      Exercises General Exercises - Lower Extremity Long Arc Quad: AROM;Left;5 reps;Seated    General Comments  Pertinent Vitals/Pain Pain Assessment: Faces Faces Pain Scale: Hurts even more Pain Location: LLE, LUE Pain Descriptors / Indicators: Constant;Guarding;Grimacing;Sore Pain Intervention(s): Limited activity within patient's tolerance;Monitored during session;Repositioned    Home Living                      Prior Function            PT Goals (current goals can now be found in the care plan section) Acute Rehab PT  Goals Patient Stated Goal: get better, go home PT Goal Formulation: With patient Time For Goal Achievement: 03/15/21 Potential to Achieve Goals: Good Progress towards PT goals: Progressing toward goals    Frequency    Min 3X/week      PT Plan Current plan remains appropriate    Co-evaluation              AM-PAC PT "6 Clicks" Mobility   Outcome Measure  Help needed turning from your back to your side while in a flat bed without using bedrails?: A Lot Help needed moving from lying on your back to sitting on the side of a flat bed without using bedrails?: A Lot Help needed moving to and from a bed to a chair (including a wheelchair)?: A Lot Help needed standing up from a chair using your arms (e.g., wheelchair or bedside chair)?: A Lot Help needed to walk in hospital room?: Total Help needed climbing 3-5 steps with a railing? : Total 6 Click Score: 10    End of Session   Activity Tolerance: Patient limited by fatigue;Patient limited by pain Patient left: in chair;with chair alarm set;with call bell/phone within reach;with family/visitor present Nurse Communication: Mobility status;Need for lift equipment;Weight bearing status;Precautions (reviewed posterior hip precautions, TDWB LLE, and stedy use with RN Raquel Sarna and NT Antony Madura) PT Visit Diagnosis: Muscle weakness (generalized) (M62.81);Pain Pain - Right/Left: Left Pain - part of body: Shoulder;Hip;Leg     Time: 7078-6754 PT Time Calculation (min) (ACUTE ONLY): 28 min  Charges:  $Therapeutic Activity: 23-37 mins                    Stacie Glaze, PT DPT Acute Rehabilitation Services Pager [phone]  Office [phone]    Louis Matte 03/02/2021, 12:37 PM

## 2021-03-02 NOTE — Plan of Care (Signed)

## 2021-03-02 NOTE — Progress Notes (Signed)
Inpatient Rehab Admissions Coordinator:   Recommendations from ortho are for non-operative management of fractures.  Unfortunately, pt does not have the medical necessity to support a CIR admission at this time.  Shann Medal, PT, DPT Admissions Coordinator [phone] 03/02/21  4:08 PM

## 2021-03-02 NOTE — Care Management Important Message (Signed)
Important Message  Patient Details  Name: [name] MRN: [id number] Date of Birth: 1951/02/01   Medicare Important Message Given:  Yes     Orbie Pyo 03/02/2021, 4:25 PM

## 2021-03-02 NOTE — Progress Notes (Signed)
PROGRESS NOTE    [name]  [id number] DOB: 08-17-1950 DOA: 02/28/2021 PCP: Hoyt Koch, MD    Brief Narrative:  70 year old female with a history of atrial fibrillation on anticoagulation, diastolic CHF, COPD, diabetes, presents to the emergency room after having an unwitnessed fall.  She was found to have a left humerus fracture as well as left acetabular fracture.  She was seen by orthopedics with recommendations for nonoperative management.  She was also noted to be hyponatremic which resolved with IV fluids.  Plan is for skilled nursing facility placement   Assessment & Plan:   Active Problems:   Fall   Proximal left humerus fracture, Left acetabular fracture. -seen by ortho, who also discussed case with trauma specialist -Recommendations are for nonoperative management at this point -She is to be nonweightbearing left upper extremity and touchdown weightbearing of the left lower extremity -Plans were to follow-up with orthopedics as an outpatient -Continue pain management -Seen by physical therapy initially recommended CIR, but it was not felt that she was an appropriate candidate for CIR -Plan will be for skilled nursing facility placement   Permanent A fib -Toprol initially held due to soft blood pressures on admission -Since blood pressures have trended back up, will resume Toprol at home dose as well as diltiazem -Since arrival plans for surgery, will also resume Xarelto for anticoagulation   Hyponatremia -Serum sodium of 126 on admission -appears to be euvolemic -TSH and cortisol normal -Improved with saline to 130   DM2 -A1c 7.1 -chronically on jamumet and invokana -continue on SSI -blood sugars stable   Chronic anxiety/depresison -continue on paxil   Restless leg syndrome -continue on requip -Follow-up with neurology as an outpatient   HLD -continue on statin   Chronic diastolic chf -currently appears compensated -We will resume  diuretics in a.m. if she continues to improve   COPD -no wheezing or shortness of breath at this time -currently on RA -continue on Anoro -Follow-up with pulmonology as an outpatient   DVT prophylaxis: SCDs Start: 02/28/21 1933 rivaroxaban (XARELTO) tablet 20 mg  Code Status: Full code Family Communication: Discussed with patient's wife at the bedside Disposition Plan: Status is: Inpatient  Remains inpatient appropriate because:Ongoing active pain requiring inpatient pain management, Unsafe d/c plan, and Inpatient level of care appropriate due to severity of illness  Dispo:  Patient From: Home  Planned Disposition: [place]  Medically stable for discharge: Yes       Consultants:  Orthopedics  Procedures:    Antimicrobials:      Subjective: Feels that pain is about the same as it was yesterday.  Denies any chest pain or shortness of breath.  Objective: Vitals:   03/01/21 1939 03/02/21 0901 03/02/21 0918 03/02/21 1500  BP: (!) 155/64 (!) 156/59  (!) 158/66  Pulse: (!) 109 85  88  Resp: 17 17    Temp: 98.3 F (36.8 C) 98.9 F (37.2 C)  98.7 F (37.1 C)  TempSrc: Oral Oral  Oral  SpO2: 93% 95% 95% 97%  Weight:      Height:        Intake/Output Summary (Last 24 hours) at 03/02/2021 2102 Last data filed at 03/02/2021 1638 Gross per 24 hour  Intake --  Output 600 ml  Net -600 ml   Filed Weights   02/28/21 1134  Weight: 81.6 kg    Examination:  General exam: Appears calm and comfortable  Respiratory system: Clear to auscultation. Respiratory effort normal. Cardiovascular system:  S1 & S2 heard, irregular.  No JVD, murmurs, rubs, gallops or clicks. No pedal edema. Gastrointestinal system: Abdomen is nondistended, soft and nontender. No organomegaly or masses felt. Normal bowel sounds heard. Central nervous system: Alert and oriented. No focal neurological deficits. Extremities: Left arm is in sling Skin: No rashes, lesions or  ulcers Psychiatry: Judgement and insight appear normal. Mood & affect appropriate.     Data Reviewed: I have personally reviewed following labs and imaging studies  CBC: Recent Labs  Lab 02/28/21 1136 03/01/21 0018 03/02/21 0228  WBC 7.5 11.4* 11.4*  NEUTROABS 5.8  --   --   HGB 11.2* 9.3* 9.6*  HCT 32.3* 27.8* 27.1*  MCV 87.1 87.7 85.5  PLT 212 209 [id number]   Basic Metabolic Panel: Recent Labs  Lab 02/28/21 1136 03/01/21 0018 03/02/21 0228  NA 126* 128* 130*  K 4.5 5.2* 4.5  CL 91* 96* 97*  CO2 '26 24 24  '$ GLUCOSE 183* 160* 153*  BUN '21 23 13  '$ CREATININE 1.20* 1.50* 1.01*  CALCIUM 9.3 8.8* 9.0  MG  --  1.7  --   PHOS  --  4.5  --    GFR: Estimated Creatinine Clearance: 49.8 mL/min (A) (by C-G formula based on SCr of 1.01 mg/dL (H)). Liver Function Tests: Recent Labs  Lab 02/28/21 1136  AST 25  ALT 16  ALKPHOS 55  BILITOT 0.8  PROT 7.5  ALBUMIN 4.3   No results for input(s): LIPASE, AMYLASE in the last 168 hours. No results for input(s): AMMONIA in the last 168 hours. Coagulation Profile: No results for input(s): INR, PROTIME in the last 168 hours. Cardiac Enzymes: No results for input(s): CKTOTAL, CKMB, CKMBINDEX, TROPONINI in the last 168 hours. BNP (last 3 results) No results for input(s): PROBNP in the last 8760 hours. HbA1C: Recent Labs    02/28/21 1136  HGBA1C 7.1*   CBG: Recent Labs  Lab 03/01/21 1554 03/01/21 2005 03/02/21 0719 03/02/21 1124 03/02/21 1632  GLUCAP 138* 166* 152* 148* 165*   Lipid Profile: No results for input(s): CHOL, HDL, LDLCALC, TRIG, CHOLHDL, LDLDIRECT in the last 72 hours. Thyroid Function Tests: Recent Labs    03/01/21 0018  TSH 1.920   Anemia Panel: No results for input(s): VITAMINB12, FOLATE, FERRITIN, TIBC, IRON, RETICCTPCT in the last 72 hours. Sepsis Labs: No results for input(s): PROCALCITON, LATICACIDVEN in the last 168 hours.  Recent Results (from the past 240 hour(s))  Resp Panel by RT-PCR (Flu  A&B, Covid) Nasopharyngeal Swab     Status: None   Collection Time: 02/28/21  7:06 PM   Specimen: Nasopharyngeal Swab; Nasopharyngeal(NP) swabs in vial transport medium  Result Value Ref Range Status   SARS Coronavirus 2 by RT PCR NEGATIVE NEGATIVE Final    Comment: (NOTE) SARS-CoV-2 target nucleic acids are NOT DETECTED.  The SARS-CoV-2 RNA is generally detectable in upper respiratory specimens during the acute phase of infection. The lowest concentration of SARS-CoV-2 viral copies this assay can detect is 138 copies/mL. A negative result does not preclude SARS-Cov-2 infection and should not be used as the sole basis for treatment or other patient management decisions. A negative result may occur with  improper specimen collection/handling, submission of specimen other than nasopharyngeal swab, presence of viral mutation(s) within the areas targeted by this assay, and inadequate number of viral copies(<138 copies/mL). A negative result must be combined with clinical observations, patient history, and epidemiological information. The expected result is Negative.  Fact Sheet for Patients:  EntrepreneurPulse.com.au  Fact Sheet for Healthcare Providers:  IncredibleEmployment.be  This test is no t yet approved or cleared by the [place] FDA and  has been authorized for detection and/or diagnosis of SARS-CoV-2 by FDA under an Emergency Use Authorization (EUA). This EUA will remain  in effect (meaning this test can be used) for the duration of the COVID-19 declaration under Section 564(b)(1) of the Act, 21 U.S.C.section 360bbb-3(b)(1), unless the authorization is terminated  or revoked sooner.       Influenza A by PCR NEGATIVE NEGATIVE Final   Influenza B by PCR NEGATIVE NEGATIVE Final    Comment: (NOTE) The Xpert Xpress SARS-CoV-2/FLU/RSV plus assay is intended as an aid in the diagnosis of influenza from Nasopharyngeal swab specimens  and should not be used as a sole basis for treatment. Nasal washings and aspirates are unacceptable for Xpert Xpress SARS-CoV-2/FLU/RSV testing.  Fact Sheet for Patients: EntrepreneurPulse.com.au  Fact Sheet for Healthcare Providers: IncredibleEmployment.be  This test is not yet approved or cleared by the [place] FDA and has been authorized for detection and/or diagnosis of SARS-CoV-2 by FDA under an Emergency Use Authorization (EUA). This EUA will remain in effect (meaning this test can be used) for the duration of the COVID-19 declaration under Section 564(b)(1) of the Act, 21 U.S.C. section 360bbb-3(b)(1), unless the authorization is terminated or revoked.  Performed at [facility], [place] [address] [place], [place] [address]          Radiology Studies: No results found.      Scheduled Meds:  diltiazem  180 mg Oral Daily   famotidine  20 mg Oral BID   guaiFENesin  600 mg Oral BID   insulin aspart  0-5 Units Subcutaneous QHS   insulin aspart  0-9 Units Subcutaneous TID WC   latanoprost  1 drop Both Eyes QHS   metoprolol succinate  100 mg Oral BID   PARoxetine  25 mg Oral Daily   rivaroxaban  20 mg Oral Daily   rosuvastatin  10 mg Oral QHS   traZODone  50 mg Oral QHS   umeclidinium-vilanterol  1 puff Inhalation Daily   Continuous Infusions:   LOS: 2 days    Time spent: 63mns    JKathie Dike MD [facility]   If 7PM-7AM, please contact night-coverage [email or web address]  03/02/2021, 9:02 PM

## 2021-03-02 NOTE — Progress Notes (Signed)
I spoke with patient and spouse today about her injuries.  She has a left proximal humerus fracture as well as acetabular fracture.  Both are nondisplaced.  I consulted with Dr. Doreatha Martin who agreed that nonoperative management of the acetabular fracture was appropriate as operative management would not change her weightbearing status.  The same applies for her shoulder.  Family is understanding of this.  We will continue to plan for nonoperative management of these fractures.  She is touchdown weightbearing left lower extremity and nonweightbearing in a sling left upper extremity.  This will continue for 6 weeks.  Patient should follow-up in about 7 to 10 days for repeat x-rays in clinic if she is discharged at that point.  Orthopedic signing off while patient is inpatient.  Appropriate for discharge at the discretion of the medical team.

## 2021-03-03 ENCOUNTER — Inpatient Hospital Stay (HOSPITAL_COMMUNITY): Payer: HMO

## 2021-03-03 ENCOUNTER — Encounter (HOSPITAL_COMMUNITY): Payer: Self-pay | Admitting: Internal Medicine

## 2021-03-03 DIAGNOSIS — E119 Type 2 diabetes mellitus without complications: Secondary | ICD-10-CM

## 2021-03-03 DIAGNOSIS — E871 Hypo-osmolality and hyponatremia: Secondary | ICD-10-CM | POA: Diagnosis present

## 2021-03-03 DIAGNOSIS — S42202A Unspecified fracture of upper end of left humerus, initial encounter for closed fracture: Secondary | ICD-10-CM | POA: Diagnosis present

## 2021-03-03 DIAGNOSIS — S32402A Unspecified fracture of left acetabulum, initial encounter for closed fracture: Secondary | ICD-10-CM | POA: Diagnosis present

## 2021-03-03 DIAGNOSIS — J449 Chronic obstructive pulmonary disease, unspecified: Secondary | ICD-10-CM | POA: Diagnosis present

## 2021-03-03 DIAGNOSIS — I4821 Permanent atrial fibrillation: Secondary | ICD-10-CM | POA: Diagnosis present

## 2021-03-03 DIAGNOSIS — N179 Acute kidney failure, unspecified: Secondary | ICD-10-CM | POA: Diagnosis present

## 2021-03-03 DIAGNOSIS — I5032 Chronic diastolic (congestive) heart failure: Secondary | ICD-10-CM | POA: Diagnosis present

## 2021-03-03 DIAGNOSIS — G2581 Restless legs syndrome: Secondary | ICD-10-CM | POA: Diagnosis present

## 2021-03-03 LAB — GLUCOSE, CAPILLARY
Glucose-Capillary: 150 mg/dL — ABNORMAL HIGH (ref 70–99)
Glucose-Capillary: 154 mg/dL — ABNORMAL HIGH (ref 70–99)
Glucose-Capillary: 181 mg/dL — ABNORMAL HIGH (ref 70–99)
Glucose-Capillary: 163 mg/dL — ABNORMAL HIGH (ref 70–99)

## 2021-03-03 MED ORDER — OXYCODONE HCL 5 MG PO TABS
5.0000 mg | ORAL_TABLET | Freq: Four times a day (QID) | ORAL | Status: DC | PRN
  Administered 2021-03-03 – 2021-03-07 (×8): 10 mg via ORAL
  Filled 2021-03-03 (×8): qty 2

## 2021-03-03 NOTE — Progress Notes (Signed)
Physical Therapy Treatment Patient Details Name: [name] MRN: [id number] DOB: 04/14/1951 Today's Date: 03/03/2021    History of Present Illness [name] is a 70 y.o. female who presented to [facility] ED from home after an unwitnessed fall out of bed. Pt presents with minimally displaced fracture involving the surgical neck of the proximal left humerus, and Acute nondisplaced fracture involving posterior wall of the acetabulum with extension of lucency to the articular surface (non-surgical management for both). Medical history significant for chronic hyponatremia, permanent A. fib on Eliquis, chronic diastolic CHF, COPD, type 2 diabetes, GERD, hypertension, hyperlipidemia, spinal stenosis    PT Comments    PT and OT saw pt together to progress pt mobility, +2 assist. Pt overall requiring mod-max +2 assist for bed mobility, transfer to recliner via stedy. Pt tolerating repeated standing in stedy today with PT focusing on safe TDWB through LLE and standing tolerance. PT to continue to follow, SNF is the appropriate d/c at this time.     Follow Up Recommendations  Supervision for mobility/OOB;SNF     Equipment Recommendations  None recommended by PT    Recommendations for Other Services       Precautions / Restrictions Precautions Precautions: Fall;Posterior Hip Precaution Comments: posterior hip precautions reviewed given acetabular fx Required Braces or Orthoses: Sling Restrictions Weight Bearing Restrictions: Yes LUE Weight Bearing: Non weight bearing LLE Weight Bearing: Touchdown weight bearing    Mobility  Bed Mobility Overal bed mobility: Needs Assistance Bed Mobility: Supine to Sit     Supine to sit: Max assist;+2 for physical assistance     General bed mobility comments: max +2 for trunk elevation, LE management via helicopter technique to EOB.    Transfers Overall transfer level: Needs assistance Equipment used: Ambulation equipment used Transfers: Sit  to/from Stand          Lateral/Scoot Transfers: Mod assist;+2 physical assistance General transfer comment: Mod +2 for power up from posterior trunk, rise, and steadying. PT physically checking TDWB with PT hand, and encouraged shift to RLE to prevent WB. STS x3, 1 from EOB and 2 from stedy.  Ambulation/Gait             General Gait Details: nt   Marine scientist Rankin (Stroke Patients Only)       Balance Overall balance assessment: Needs assistance Sitting-balance support: Feet supported;Single extremity supported Sitting balance-Leahy Scale: Fair     Standing balance support: During functional activity;Single extremity supported Standing balance-Leahy Scale: Poor Standing balance comment: reliant on external assist and physical support                            Cognition Arousal/Alertness: Awake/alert Behavior During Therapy: WFL for tasks assessed/performed Overall Cognitive Status: Impaired/Different from baseline Area of Impairment: Memory;Attention                   Current Attention Level: Sustained Memory: Decreased short-term memory         General Comments: tearful due to pain, requires repeated cues as pt states multiple times during session "now what are we doing" after PT/OT already explained task at hand.      Exercises General Exercises - Lower Extremity Ankle Circles/Pumps: AROM;Both;5 reps;Seated    General Comments        Pertinent Vitals/Pain      Home Living  Prior Function            PT Goals (current goals can now be found in the care plan section) Acute Rehab PT Goals Patient Stated Goal: get better, go home PT Goal Formulation: With patient Time For Goal Achievement: 03/15/21 Potential to Achieve Goals: Good Progress towards PT goals: Progressing toward goals    Frequency    Min 3X/week      PT Plan Current plan  remains appropriate    Co-evaluation PT/OT/SLP Co-Evaluation/Treatment: Yes Reason for Co-Treatment: For patient/therapist safety;To address functional/ADL transfers PT goals addressed during session: Mobility/safety with mobility;Balance;Strengthening/ROM        AM-PAC PT "6 Clicks" Mobility   Outcome Measure  Help needed turning from your back to your side while in a flat bed without using bedrails?: A Lot Help needed moving from lying on your back to sitting on the side of a flat bed without using bedrails?: A Lot Help needed moving to and from a bed to a chair (including a wheelchair)?: A Lot Help needed standing up from a chair using your arms (e.g., wheelchair or bedside chair)?: A Lot Help needed to walk in hospital room?: Total Help needed climbing 3-5 steps with a railing? : Total 6 Click Score: 10    End of Session   Activity Tolerance: Patient limited by fatigue;Patient limited by pain Patient left: in chair;with chair alarm set;with call bell/phone within reach;with family/visitor present Nurse Communication: Mobility status;Need for lift equipment;Weight bearing status (stedy for back to bed) PT Visit Diagnosis: Muscle weakness (generalized) (M62.81);Pain Pain - Right/Left: Left Pain - part of body: Shoulder;Hip;Leg     Time: [id number] PT Time Calculation (min) (ACUTE ONLY): 29 min  Charges:  $Therapeutic Activity: 8-22 mins                    Stacie Glaze, PT DPT Acute Rehabilitation Services Pager [phone]  Office [phone]    Roxine Caddy E Ruffin Pyo 03/03/2021, 10:57 AM

## 2021-03-03 NOTE — Progress Notes (Signed)
Occupational Therapy Treatment Patient Details Name: [name] MRN: [id number] DOB: October 31, 1950 Today's Date: 03/03/2021    History of present illness [name] is a 70 y.o. female who presented to [facility] ED from home after an unwitnessed fall out of bed. Pt presents with minimally displaced fracture involving the surgical neck of the proximal left humerus, and Acute nondisplaced fracture involving posterior wall of the acetabulum with extension of lucency to the articular surface (non-surgical management for both). Medical history significant for chronic hyponatremia, permanent A. fib on Eliquis, chronic diastolic CHF, COPD, type 2 diabetes, GERD, hypertension, hyperlipidemia, spinal stenosis   OT comments  Patient tearful today due to pain with mobility despite premedicated for therapy session. Patient needing max A x2 for bed mobility with use of bed pads to scoot hips and elevate trunk to sitting. Patient with initial posterior lean needing min to mod A for support and max A to scoot hips toward edge of bed. Patient needing step by step instruction for body mechanics when using stedy for transfer. Patient mod x2 to power up to standing, and practiced additional sit to stand from stedy with mod A before transferring into chair. Patient needing cues to weight shift toward R in order to maintain weight bearing restrictions on L LE. Patient also with poor eccentric control into chair, unable to let go of stedy with R UE to assist needing mod A x2. Patient reports too fatigued for oral care in sitting, set up to wash face. Per chart review CIR denied, recommend SNF rehab at D/C as patient still requiring significant assistance with self care and functional mobility.   Follow Up Recommendations  SNF    Equipment Recommendations  Other (comment) (TBD)       Precautions / Restrictions Precautions Precautions: Fall;Posterior Hip Precaution Comments: posterior hip precautions reviewed given  acetabular fx Required Braces or Orthoses: Sling Restrictions Weight Bearing Restrictions: Yes LUE Weight Bearing: Non weight bearing LLE Weight Bearing: Touchdown weight bearing       Mobility Bed Mobility Overal bed mobility: Needs Assistance Bed Mobility: Supine to Sit     Supine to sit: Max assist;+2 for physical assistance     General bed mobility comments: max +2 for trunk elevation, LE management via helicopter technique to EOB.    Transfers Overall transfer level: Needs assistance Equipment used: Ambulation equipment used Transfers: Sit to/from [facility] Sit to Stand: Mod assist;+2 physical assistance;+2 safety/equipment;From elevated surface Stand pivot transfers: Total assist      Lateral/Scoot Transfers: Mod assist;+2 physical assistance General transfer comment: Mod +2 for power up from elevated bed height. Needs cues with PT encouraging weight shift toward R in order to maintain TDWB on L LE. Patient practice standing from stedy 1 additional time with mod A before transferring into recliner chair. poor eccenctric control with sitting, states cannot let go with R hand to reach back for chair to assist    Balance Overall balance assessment: Needs assistance Sitting-balance support: Feet supported;Single extremity supported Sitting balance-Leahy Scale: Poor Sitting balance - Comments: initial posterior lean needing min to moderate assistance, progress to min G   Standing balance support: During functional activity;Single extremity supported Standing balance-Leahy Scale: Poor Standing balance comment: reliant on external assist and physical support                           ADL either performed or assessed with clinical judgement   ADL Overall ADL's :  Needs assistance/impaired     Grooming: Wash/dry face;Set up;Sitting Grooming Details (indicate cue type and reason): reports feeling exhausted after transfer, declined brushing  teeth at this time                 Toilet Transfer: Total assistance Toilet Transfer Details (indicate cue type and reason): patient mod x2 to power up to standing from elevated bed height, use of stedy to transfer to recliner chair and needing mod x2 for eccentric control into chair. unable to let go of stedy and reach back for chair arm when cued         Functional mobility during ADLs: Moderate assistance;+2 for physical assistance;+2 for safety/equipment General ADL Comments: patient with significant pain this session despite medication prior to session.      Cognition Arousal/Alertness: Awake/alert Behavior During Therapy: WFL for tasks assessed/performed Overall Cognitive Status: Impaired/Different from baseline Area of Impairment: Memory                   Current Attention Level: Sustained Memory: Decreased short-term memory         General Comments: tearful due to pain, requires repeated cues as pt states multiple times during session "now what are we doing" after PT/OT already explained task at hand.        Exercises General Exercises - Lower Extremity Ankle Circles/Pumps: AROM;Both;5 reps;Seated           Pertinent Vitals/ Pain       Pain Assessment: Faces Faces Pain Scale: Hurts even more Pain Location: LLE, LUE Pain Descriptors / Indicators: Guarding;Grimacing;Sore;Crying Pain Intervention(s): Monitored during session;Premedicated before session         Frequency  Min 2X/week        Progress Toward Goals  OT Goals(current goals can now be found in the care plan section)  Progress towards OT goals: Progressing toward goals  Acute Rehab OT Goals Patient Stated Goal: get better, go home OT Goal Formulation: With patient Time For Goal Achievement: 03/15/21 Potential to Achieve Goals: Fair ADL Goals Pt Will Perform Grooming: with set-up;sitting Pt Will Perform Upper Body Bathing: with set-up;sitting Pt Will Perform Lower Body  Bathing: with min assist;sitting/lateral leans Pt Will Perform Upper Body Dressing: with set-up;sitting Pt Will Perform Lower Body Dressing: with min assist;sitting/lateral leans Pt Will Transfer to Toilet: with min assist;bedside commode;squat pivot transfer  Plan Discharge plan needs to be updated    Co-evaluation    PT/OT/SLP Co-Evaluation/Treatment: Yes Reason for Co-Treatment: For patient/therapist safety;To address functional/ADL transfers PT goals addressed during session: Mobility/safety with mobility OT goals addressed during session: ADL's and self-care      AM-PAC OT "6 Clicks" Daily Activity     Outcome Measure   Help from another person eating meals?: A Little Help from another person taking care of personal grooming?: A Little Help from another person toileting, which includes using toliet, bedpan, or urinal?: Total Help from another person bathing (including washing, rinsing, drying)?: A Lot Help from another person to put on and taking off regular upper body clothing?: A Lot Help from another person to put on and taking off regular lower body clothing?: Total 6 Click Score: 12    End of Session    OT Visit Diagnosis: Unsteadiness on feet (R26.81);Other abnormalities of gait and mobility (R26.89);Muscle weakness (generalized) (M62.81);History of falling (Z91.81);Pain Pain - Right/Left: Left Pain - part of body: Leg;Arm;Hip   Activity Tolerance Patient limited by pain   Patient Left in chair;with call  bell/phone within reach;with chair alarm set   Nurse Communication Mobility status        Time: [id number] OT Time Calculation (min): 29 min  Charges: OT General Charges $OT Visit: 1 Visit OT Treatments $Self Care/Home Management : 8-22 mins  Delbert Phenix OT OT pager: [phone]  Rosemary Holms 03/03/2021, 1:09 PM

## 2021-03-03 NOTE — Progress Notes (Addendum)
PROGRESS NOTE    [name]  [id number] DOB: 15-Mar-1951 DOA: 02/28/2021 PCP: Hoyt Koch, MD    Brief Narrative:  70 year old female with a history of atrial fibrillation on anticoagulation, diastolic CHF, COPD, diabetes, presents to the emergency room after having an unwitnessed fall.  She was found to have a left humerus fracture as well as left acetabular fracture.  She was seen by orthopedics with recommendations for nonoperative management.  She was also noted to be hyponatremic which resolved with IV fluids.  Plan is for skilled nursing facility placement   Assessment & Plan:   Active Problems:   Fall   Proximal left humerus fracture, Left acetabular fracture. -seen by ortho, who also discussed case with trauma specialist -Recommendations are for nonoperative management at this point -She is to be nonweightbearing left upper extremity and touchdown weightbearing of the left lower extremity -Plans were to follow-up with orthopedics as an outpatient -Continue pain management -Seen by physical therapy initially recommended CIR, but it was not felt that she was an appropriate candidate for CIR -Plan will be for skilled nursing facility placement -since she has worsening pain in left arm today, will repeat xray -adjust pain meds.   Permanent A fib -HR stable on toprol and diltiazem -she is anticoagulated with xarelto   Hyponatremia -Serum sodium of 126 on admission -appears to be euvolemic -TSH and cortisol normal -Improved with saline to 130 -repeat in AM   DM2 -A1c 7.1 -chronically on jamumet and invokana -continue on SSI -blood sugars stable   Chronic anxiety/depresison -continue on paxil   Restless leg syndrome -continue on requip -Follow-up with neurology as an outpatient   HLD -continue on statin   Chronic diastolic chf -currently appears compensated   COPD -no wheezing or shortness of breath at this time -currently on RA -continue  on Anoro -Follow-up with pulmonology as an outpatient  AKI -creatinine up to 1.5 -held diuretics and gave gentle hydration -creatinine down to 1.1   DVT prophylaxis: SCDs Start: 02/28/21 1933 rivaroxaban (XARELTO) tablet 20 mg  Code Status: Full code Family Communication: Discussed with patient's wife at the bedside Disposition Plan: Status is: Inpatient  Remains inpatient appropriate because:Ongoing active pain requiring inpatient pain management, Unsafe d/c plan, and Inpatient level of care appropriate due to severity of illness  Dispo:  Patient From: Home  Planned Disposition: To be determined  Medically stable for discharge: Yes       Consultants:  Orthopedics  Procedures:    Antimicrobials:      Subjective: Reports worsening pain in her left arm this morning. Until now, only had pain in arm when moving, none at rest. After she was turned this morning, she reports continuous pain at rest. No numbness in arm. She is able to move her fingers without difficulty  Objective: Vitals:   03/02/21 1500 03/02/21 2155 03/03/21 0700 03/03/21 1132  BP: (!) 158/66 (!) 151/58 (!) 141/81 135/65  Pulse: 88 (!) 49 86 65  Resp:  '18 17 17  '$ Temp: 98.7 F (37.1 C) 98.6 F (37 C) 97.7 F (36.5 C) 98.6 F (37 C)  TempSrc: Oral Oral Oral Oral  SpO2: 97% 94% 96% 96%  Weight:      Height:        Intake/Output Summary (Last 24 hours) at 03/03/2021 1138 Last data filed at 03/03/2021 0930 Gross per 24 hour  Intake 240 ml  Output 3000 ml  Net -2760 ml   Filed Weights   02/28/21 1134  Weight: 81.6 kg    Examination:  General exam: Alert, awake, oriented x 3 Respiratory system: Clear to auscultation. Respiratory effort normal. Cardiovascular system:RRR. No murmurs, rubs, gallops. Gastrointestinal system: Abdomen is nondistended, soft and nontender. No organomegaly or masses felt. Normal bowel sounds heard. Central nervous system: Alert and oriented. No focal neurological  deficits. Extremities: No C/C/E, +pedal pulses, left arm in sling Skin: No rashes, lesions or ulcers Psychiatry: Judgement and insight appear normal. Mood & affect appropriate.      Data Reviewed: I have personally reviewed following labs and imaging studies  CBC: Recent Labs  Lab 02/28/21 1136 03/01/21 0018 03/02/21 0228  WBC 7.5 11.4* 11.4*  NEUTROABS 5.8  --   --   HGB 11.2* 9.3* 9.6*  HCT 32.3* 27.8* 27.1*  MCV 87.1 87.7 85.5  PLT 212 209 [id number]   Basic Metabolic Panel: Recent Labs  Lab 02/28/21 1136 03/01/21 0018 03/02/21 0228  NA 126* 128* 130*  K 4.5 5.2* 4.5  CL 91* 96* 97*  CO2 '26 24 24  '$ GLUCOSE 183* 160* 153*  BUN '21 23 13  '$ CREATININE 1.20* 1.50* 1.01*  CALCIUM 9.3 8.8* 9.0  MG  --  1.7  --   PHOS  --  4.5  --    GFR: Estimated Creatinine Clearance: 49.8 mL/min (A) (by C-G formula based on SCr of 1.01 mg/dL (H)). Liver Function Tests: Recent Labs  Lab 02/28/21 1136  AST 25  ALT 16  ALKPHOS 55  BILITOT 0.8  PROT 7.5  ALBUMIN 4.3   No results for input(s): LIPASE, AMYLASE in the last 168 hours. No results for input(s): AMMONIA in the last 168 hours. Coagulation Profile: No results for input(s): INR, PROTIME in the last 168 hours. Cardiac Enzymes: No results for input(s): CKTOTAL, CKMB, CKMBINDEX, TROPONINI in the last 168 hours. BNP (last 3 results) No results for input(s): PROBNP in the last 8760 hours. HbA1C: No results for input(s): HGBA1C in the last 72 hours.  CBG: Recent Labs  Lab 03/02/21 1124 03/02/21 1632 03/02/21 2050 03/03/21 0656 03/03/21 1134  GLUCAP 148* 165* 122* 150* 154*   Lipid Profile: No results for input(s): CHOL, HDL, LDLCALC, TRIG, CHOLHDL, LDLDIRECT in the last 72 hours. Thyroid Function Tests: Recent Labs    03/01/21 0018  TSH 1.920   Anemia Panel: No results for input(s): VITAMINB12, FOLATE, FERRITIN, TIBC, IRON, RETICCTPCT in the last 72 hours. Sepsis Labs: No results for input(s): PROCALCITON,  LATICACIDVEN in the last 168 hours.  Recent Results (from the past 240 hour(s))  Resp Panel by RT-PCR (Flu A&B, Covid) Nasopharyngeal Swab     Status: None   Collection Time: 02/28/21  7:06 PM   Specimen: Nasopharyngeal Swab; Nasopharyngeal(NP) swabs in vial transport medium  Result Value Ref Range Status   SARS Coronavirus 2 by RT PCR NEGATIVE NEGATIVE Final    Comment: (NOTE) SARS-CoV-2 target nucleic acids are NOT DETECTED.  The SARS-CoV-2 RNA is generally detectable in upper respiratory specimens during the acute phase of infection. The lowest concentration of SARS-CoV-2 viral copies this assay can detect is 138 copies/mL. A negative result does not preclude SARS-Cov-2 infection and should not be used as the sole basis for treatment or other patient management decisions. A negative result may occur with  improper specimen collection/handling, submission of specimen other than nasopharyngeal swab, presence of viral mutation(s) within the areas targeted by this assay, and inadequate number of viral copies(<138 copies/mL). A negative result must be combined with clinical observations, patient history,  and epidemiological information. The expected result is Negative.  Fact Sheet for Patients:  EntrepreneurPulse.com.au  Fact Sheet for Healthcare Providers:  IncredibleEmployment.be  This test is no t yet approved or cleared by the [place] FDA and  has been authorized for detection and/or diagnosis of SARS-CoV-2 by FDA under an Emergency Use Authorization (EUA). This EUA will remain  in effect (meaning this test can be used) for the duration of the COVID-19 declaration under Section 564(b)(1) of the Act, 21 U.S.C.section 360bbb-3(b)(1), unless the authorization is terminated  or revoked sooner.       Influenza A by PCR NEGATIVE NEGATIVE Final   Influenza B by PCR NEGATIVE NEGATIVE Final    Comment: (NOTE) The Xpert Xpress  SARS-CoV-2/FLU/RSV plus assay is intended as an aid in the diagnosis of influenza from Nasopharyngeal swab specimens and should not be used as a sole basis for treatment. Nasal washings and aspirates are unacceptable for Xpert Xpress SARS-CoV-2/FLU/RSV testing.  Fact Sheet for Patients: EntrepreneurPulse.com.au  Fact Sheet for Healthcare Providers: IncredibleEmployment.be  This test is not yet approved or cleared by the [place] FDA and has been authorized for detection and/or diagnosis of SARS-CoV-2 by FDA under an Emergency Use Authorization (EUA). This EUA will remain in effect (meaning this test can be used) for the duration of the COVID-19 declaration under Section 564(b)(1) of the Act, 21 U.S.C. section 360bbb-3(b)(1), unless the authorization is terminated or revoked.  Performed at [facility], [place] [address], [place], [place] [address]          Radiology Studies: No results found.      Scheduled Meds:  diltiazem  180 mg Oral Daily   famotidine  20 mg Oral BID   guaiFENesin  600 mg Oral BID   insulin aspart  0-5 Units Subcutaneous QHS   insulin aspart  0-9 Units Subcutaneous TID WC   latanoprost  1 drop Both Eyes QHS   metoprolol succinate  100 mg Oral BID   PARoxetine  25 mg Oral Daily   rivaroxaban  20 mg Oral Daily   rosuvastatin  10 mg Oral QHS   traZODone  50 mg Oral QHS   umeclidinium-vilanterol  1 puff Inhalation Daily   Continuous Infusions:   LOS: 3 days    Time spent: 62mns    JKathie Dike MD [facility]   If 7PM-7AM, please contact night-coverage [email or web address]  03/03/2021, 11:38 AM

## 2021-03-03 NOTE — TOC Progression Note (Signed)
Transition of Care [facility]) - Progression Note    Patient Details  Name: [name] MRN: [id number] Date of Birth: Sep 26, 1950  Transition of Care [facility] CM/SW Contact  Sharin Mons, RN Phone Number: 03/03/2021, 10:28 AM  Clinical Narrative:    RNCM received consult for possible SNF placement at time of discharge. NCM spoke with patient regarding PT recommendation of SNF placement at time of discharge. Patient reported that [name]  is currently unable to care for patient at their home given patient's current physical needs and fall risk. Patient expressed understanding of PT recommendation and is agreeable to SNF placement at time of discharge. Patient reports no preference for SNF . NCM discussed insurance authorization process and provided Medicare SNF ratings list. Patient expressed being hopeful for rehab and to feel better soon. No further questions reported at this time. NCM to continue to follow and assist with discharge planning needs.   Covid VACCINATED and boosted x1.  Expected Discharge Plan: Skilled Nursing Facility Barriers to Discharge: Continued Medical Work up  Expected Discharge Plan and Services Expected Discharge Plan: [place]                                               Social Determinants of Health (SDOH) Interventions    Readmission Risk Interventions No flowsheet data found.

## 2021-03-03 NOTE — NC FL2 (Addendum)
[place] LEVEL OF CARE SCREENING TOOL     IDENTIFICATION  Patient Name: [name] Birthdate: Mar 08, 1951 Sex: female Admission Date (Current Location): 02/28/2021  [facility] [place] Number:  Herbalist and Address:         Provider Number: [id number]  Attending Physician Name and Address:  Kathie Dike, MD  Relative Name and Phone Number:       Current Level of Care: Hospital Recommended Level of Care: [place] Prior Approval Number:    Date Approved/Denied:   PASRR Number: [id number]  Discharge Plan: SNF    Current Diagnoses: Patient Active Problem List   Diagnosis Date Noted   Fall 02/28/2021    Orientation RESPIRATION BLADDER Height & Weight     Self, Time, Situation, Place  Normal Continent Weight: 81.6 kg Height:  '5\' 2"'$  (157.5 cm)  BEHAVIORAL SYMPTOMS/MOOD NEUROLOGICAL BOWEL NUTRITION STATUS      Continent Diet (Refero d/c summary)  AMBULATORY STATUS COMMUNICATION OF NEEDS Skin   Extensive Assist   Normal                       Personal Care Assistance Level of Assistance  Bathing, Feeding, Dressing Bathing Assistance: Maximum assistance Feeding assistance: Limited assistance Dressing Assistance: Maximum assistance     Functional Limitations Info  Sight, Hearing, Speech Sight Info: Adequate Hearing Info: Adequate Speech Info: Adequate    SPECIAL CARE FACTORS FREQUENCY  PT (By licensed PT), OT (By licensed OT)     PT Frequency: 5x/week, evaluate and treat OT Frequency: 5x/week, evaluate and treat            Contractures Contractures Info: Not present    Additional Factors Info  Code Status, Allergies Code Status Info: full code Allergies Info: Benadryl (Diphenhydramine)           Current Medications (03/03/2021):  This is the current hospital active medication list Current Facility-Administered Medications  Medication Dose Route Frequency Provider Last Rate Last Admin    acetaminophen (TYLENOL) tablet 650 mg  650 mg Oral Q6H PRN Irene Pap N, DO       diltiazem (CARDIZEM CD) 24 hr capsule 180 mg  180 mg Oral Daily Kathie Dike, MD   180 mg at 03/03/21 0853   famotidine (PEPCID) tablet 20 mg  20 mg Oral BID Kathie Dike, MD   20 mg at 03/03/21 0853   guaiFENesin (MUCINEX) 12 hr tablet 600 mg  600 mg Oral BID Kathie Dike, MD   600 mg at 03/03/21 0853   HYDROmorphone (DILAUDID) injection 0.5 mg  0.5 mg Intravenous Q4H PRN Irene Pap N, DO   0.5 mg at 03/03/21 0014   insulin aspart (novoLOG) injection 0-5 Units  0-5 Units Subcutaneous QHS Hall, Carole N, DO       insulin aspart (novoLOG) injection 0-9 Units  0-9 Units Subcutaneous TID WC Irene Pap N, DO   1 Units at 03/03/21 0853   latanoprost (XALATAN) 0.005 % ophthalmic solution 1 drop  1 drop Both Eyes QHS Kathie Dike, MD   1 drop at 03/02/21 2203   metoprolol succinate (TOPROL-XL) 24 hr tablet 100 mg  100 mg Oral BID Kathie Dike, MD   100 mg at 03/03/21 0853   ondansetron (ZOFRAN) injection 4 mg  4 mg Intravenous Q6H PRN Irene Pap N, DO       oxyCODONE (Oxy IR/ROXICODONE) immediate release tablet 5 mg  5 mg Oral Q6H PRN Irene Pap  N, DO   5 mg at 03/03/21 0853   PARoxetine (PAXIL) tablet 25 mg  25 mg Oral Daily Irene Pap N, DO   25 mg at 03/03/21 [id number]   polyethylene glycol (MIRALAX / GLYCOLAX) packet 17 g  17 g Oral Daily PRN Irene Pap N, DO       rivaroxaban (XARELTO) tablet 20 mg  20 mg Oral Daily Kathie Dike, MD   20 mg at 03/02/21 2209   rOPINIRole (REQUIP) tablet 1 mg  1 mg Oral TID PRN Kathie Dike, MD   1 mg at 03/02/21 2202   rosuvastatin (CRESTOR) tablet 10 mg  10 mg Oral QHS Kathie Dike, MD   10 mg at 03/02/21 2202   traZODone (DESYREL) tablet 50 mg  50 mg Oral QHS Kathie Dike, MD   50 mg at 03/02/21 2202   umeclidinium-vilanterol (ANORO ELLIPTA) 62.5-25 MCG/INH 1 puff  1 puff Inhalation Daily Kathie Dike, MD   1 puff at 03/03/21 [id number]     Discharge  Medications: Please see discharge summary for a list of discharge medications.  Relevant Imaging Results:  Relevant Lab Results:   Additional Information SS# [id number]  Sharin Mons, RN

## 2021-03-04 LAB — CBC
HCT: 23.9 % — ABNORMAL LOW (ref 36.0–46.0)
Hemoglobin: 8.1 g/dL — ABNORMAL LOW (ref 12.0–15.0)
MCH: 29.6 pg (ref 26.0–34.0)
MCHC: 33.9 g/dL (ref 30.0–36.0)
MCV: 87.2 fL (ref 80.0–100.0)
Platelets: 177 10*3/uL (ref 150–400)
RBC: 2.74 MIL/uL — ABNORMAL LOW (ref 3.87–5.11)
RDW: 13.1 % (ref 11.5–15.5)
WBC: 9.5 10*3/uL (ref 4.0–10.5)
nRBC: 0 % (ref 0.0–0.2)

## 2021-03-04 LAB — BASIC METABOLIC PANEL
Anion gap: 8 (ref 5–15)
BUN: 14 mg/dL (ref 8–23)
CO2: 23 mmol/L (ref 22–32)
Calcium: 8.8 mg/dL — ABNORMAL LOW (ref 8.9–10.3)
Chloride: 94 mmol/L — ABNORMAL LOW (ref 98–111)
Creatinine, Ser: 0.98 mg/dL (ref 0.44–1.00)
GFR, Estimated: >60 mL/min (ref >60)
Glucose, Bld: 162 mg/dL — ABNORMAL HIGH (ref 70–99)
Potassium: 4.3 mmol/L (ref 3.5–5.1)
Sodium: 125 mmol/L — ABNORMAL LOW (ref 135–145)

## 2021-03-04 LAB — GLUCOSE, CAPILLARY
Glucose-Capillary: 154 mg/dL — ABNORMAL HIGH (ref 70–99)
Glucose-Capillary: 196 mg/dL — ABNORMAL HIGH (ref 70–99)
Glucose-Capillary: 187 mg/dL — ABNORMAL HIGH (ref 70–99)
Glucose-Capillary: 230 mg/dL — ABNORMAL HIGH (ref 70–99)

## 2021-03-04 MED ORDER — MAGNESIUM HYDROXIDE 400 MG/5ML PO SUSP
30.0000 mL | ORAL | Status: AC
  Filled 2021-03-04: qty 30

## 2021-03-04 MED ORDER — POLYETHYLENE GLYCOL 3350 17 G PO PACK
17.0000 g | PACK | Freq: Every day | ORAL | Status: DC
  Administered 2021-03-04 – 2021-03-07 (×4): 17 g via ORAL
  Filled 2021-03-04 (×4): qty 1

## 2021-03-04 MED ORDER — TORSEMIDE 20 MG PO TABS
20.0000 mg | ORAL_TABLET | Freq: Two times a day (BID) | ORAL | Status: DC
  Administered 2021-03-04 – 2021-03-05 (×3): 20 mg via ORAL
  Filled 2021-03-04 (×4): qty 1

## 2021-03-04 NOTE — Plan of Care (Signed)
  Problem: Pain Managment: Goal: General experience of comfort will improve Outcome: Progressing   

## 2021-03-04 NOTE — Progress Notes (Signed)
PROGRESS NOTE    [name]  [id number] DOB: 10/14/1950 DOA: 02/28/2021 PCP: Hoyt Koch, MD    Brief Narrative:  70 year old female with a history of atrial fibrillation on anticoagulation, diastolic CHF, COPD, diabetes, presents to the emergency room after having an unwitnessed fall.  She was found to have a left humerus fracture as well as left acetabular fracture.  She was seen by orthopedics with recommendations for nonoperative management.  She was also noted to be hyponatremic which resolved with IV fluids.  Plan is for skilled nursing facility placement   Assessment & Plan:   Active Problems:   Fall   COPD (chronic obstructive pulmonary disease) (HCC)   Closed fracture of left proximal humerus   Left acetabular fracture (HCC)   Chronic diastolic CHF (congestive heart failure) (HCC)   Hyponatremia   AKI (acute kidney injury) ([place])   Permanent atrial fibrillation (HCC)   Restless leg syndrome   DM (diabetes mellitus), type 2 (HCC)   Proximal left humerus fracture, Left acetabular fracture. -seen by ortho, who also discussed case with trauma specialist -Recommendations are for nonoperative management at this point -She is to be nonweightbearing left upper extremity and touchdown weightbearing of the left lower extremity -Plans were to follow-up with orthopedics as an outpatient -Continue pain management -Seen by physical therapy initially recommended CIR, but it was not felt that she was an appropriate candidate for CIR -Plan will be for skilled nursing facility placement    Permanent A fib -HR stable on toprol and diltiazem -she was anticoagulated with xarelto, but this is being held due to anemia  Anemia -chronic -Hemoglobin has trended down since admission from 11.2 to 8.1 today -no evidence of GI bleeding -may have a component of hemodilution with Iv fluids -may also have a component of acute blood loss with extensive bruising/hematoma noted in  left arm -continue to monitor -check iron studies  Left arm hematoma -related to recent injury in the setting of anticoagulation -holding further anticoagulation for now -will apply ice to left arm -continue to monitor hemoglobin   Hyponatremia -chronic issue dating back to 2020 -Serum sodium of 126 on admission -appears to be euvolemic -she does admit to drinking a lot of water -TSH and cortisol normal -Initially improved with saline to 130, but now back down to 125 -will place on fluid restriction -restarted on lasix -repeat in AM   DM2 -A1c 7.1 -chronically on jamumet and invokana -continue on SSI -blood sugars stable   Chronic anxiety/depresison -continue on paxil   Restless leg syndrome -continue on requip -Follow-up with neurology as an outpatient   HLD -continue on statin   Chronic diastolic chf -currently appears compensated -will resume torsemide   COPD -no wheezing or shortness of breath at this time -currently on RA -continue on Anoro -Follow-up with pulmonology as an outpatient  AKI -creatinine up to 1.5 -held diuretics and gave gentle hydration -creatinine down to 0.98  Constipation -start on daily miralax -will give one dose of milk of mag today   DVT prophylaxis: Place and maintain sequential compression device Start: 03/04/21 1046 SCDs Start: 02/28/21 1933  Code Status: Full code Family Communication: Discussed with patient's wife at the bedside Disposition Plan: Status is: Inpatient  Remains inpatient appropriate because:Ongoing active pain requiring inpatient pain management, Unsafe d/c plan, and Inpatient level of care appropriate due to severity of illness  Dispo:  Patient From: Home  Planned Disposition: [facility]  Medically stable for discharge: Yes  Consultants:  Orthopedics  Procedures:    Antimicrobials:      Subjective: Feels that left arm pain is better today after adjustment in meds.  She has not had a bowel movement in several days. She admits to drink a lot of water  Objective: Vitals:   03/03/21 0700 03/03/21 1132 03/03/21 1900 03/04/21 0758  BP: (!) 141/81 135/65 (!) 124/47 (!) 143/57  Pulse: 86 65 65 87  Resp: '17 17 16 15  '$ Temp: 97.7 F (36.5 C) 98.6 F (37 C) 98.6 F (37 C) 98.3 F (36.8 C)  TempSrc: Oral Oral Oral Oral  SpO2: 96% 96% 95% 96%  Weight:      Height:        Intake/Output Summary (Last 24 hours) at 03/04/2021 1101 Last data filed at 03/04/2021 0900 Gross per 24 hour  Intake 1380 ml  Output 700 ml  Net 680 ml   Filed Weights   02/28/21 1134  Weight: 81.6 kg    Examination:  General exam: Alert, awake, oriented x 3 Respiratory system: Clear to auscultation. Respiratory effort normal. Cardiovascular system:irregular. No murmurs, rubs, gallops. Gastrointestinal system: Abdomen is nondistended, soft and nontender. No organomegaly or masses felt. Normal bowel sounds heard. Central nervous system: Alert and oriented. No focal neurological deficits. Extremities: left arm in sling, extensive bruising and swelling in left arm Skin: No rashes, lesions or ulcers Psychiatry: Judgement and insight appear normal. Mood & affect appropriate.       Data Reviewed: I have personally reviewed following labs and imaging studies  CBC: Recent Labs  Lab 02/28/21 1136 03/01/21 0018 03/02/21 0228 03/04/21 0309  WBC 7.5 11.4* 11.4* 9.5  NEUTROABS 5.8  --   --   --   HGB 11.2* 9.3* 9.6* 8.1*  HCT 32.3* 27.8* 27.1* 23.9*  MCV 87.1 87.7 85.5 87.2  PLT 212 209 166 [id number]   Basic Metabolic Panel: Recent Labs  Lab 02/28/21 1136 03/01/21 0018 03/02/21 0228 03/04/21 0309  NA 126* 128* 130* 125*  K 4.5 5.2* 4.5 4.3  CL 91* 96* 97* 94*  CO2 '26 24 24 23  '$ GLUCOSE 183* 160* 153* 162*  BUN '21 23 13 14  '$ CREATININE 1.20* 1.50* 1.01* 0.98  CALCIUM 9.3 8.8* 9.0 8.8*  MG  --  1.7  --   --   PHOS  --  4.5  --   --    GFR: Estimated Creatinine  Clearance: 51.4 mL/min (by C-G formula based on SCr of 0.98 mg/dL). Liver Function Tests: Recent Labs  Lab 02/28/21 1136  AST 25  ALT 16  ALKPHOS 55  BILITOT 0.8  PROT 7.5  ALBUMIN 4.3   No results for input(s): LIPASE, AMYLASE in the last 168 hours. No results for input(s): AMMONIA in the last 168 hours. Coagulation Profile: No results for input(s): INR, PROTIME in the last 168 hours. Cardiac Enzymes: No results for input(s): CKTOTAL, CKMB, CKMBINDEX, TROPONINI in the last 168 hours. BNP (last 3 results) No results for input(s): PROBNP in the last 8760 hours. HbA1C: No results for input(s): HGBA1C in the last 72 hours.  CBG: Recent Labs  Lab 03/03/21 0656 03/03/21 1134 03/03/21 1619 03/03/21 2016 03/04/21 0721  GLUCAP 150* 154* 181* 163* 154*   Lipid Profile: No results for input(s): CHOL, HDL, LDLCALC, TRIG, CHOLHDL, LDLDIRECT in the last 72 hours. Thyroid Function Tests: No results for input(s): TSH, T4TOTAL, FREET4, T3FREE, THYROIDAB in the last 72 hours.  Anemia Panel: No results for input(s): VITAMINB12, FOLATE, FERRITIN,  TIBC, IRON, RETICCTPCT in the last 72 hours. Sepsis Labs: No results for input(s): PROCALCITON, LATICACIDVEN in the last 168 hours.  Recent Results (from the past 240 hour(s))  Resp Panel by RT-PCR (Flu A&B, Covid) Nasopharyngeal Swab     Status: None   Collection Time: 02/28/21  7:06 PM   Specimen: Nasopharyngeal Swab; Nasopharyngeal(NP) swabs in vial transport medium  Result Value Ref Range Status   SARS Coronavirus 2 by RT PCR NEGATIVE NEGATIVE Final    Comment: (NOTE) SARS-CoV-2 target nucleic acids are NOT DETECTED.  The SARS-CoV-2 RNA is generally detectable in upper respiratory specimens during the acute phase of infection. The lowest concentration of SARS-CoV-2 viral copies this assay can detect is 138 copies/mL. A negative result does not preclude SARS-Cov-2 infection and should not be used as the sole basis for treatment  or other patient management decisions. A negative result may occur with  improper specimen collection/handling, submission of specimen other than nasopharyngeal swab, presence of viral mutation(s) within the areas targeted by this assay, and inadequate number of viral copies(<138 copies/mL). A negative result must be combined with clinical observations, patient history, and epidemiological information. The expected result is Negative.  Fact Sheet for Patients:  EntrepreneurPulse.com.au  Fact Sheet for Healthcare Providers:  IncredibleEmployment.be  This test is no t yet approved or cleared by the [place] FDA and  has been authorized for detection and/or diagnosis of SARS-CoV-2 by FDA under an Emergency Use Authorization (EUA). This EUA will remain  in effect (meaning this test can be used) for the duration of the COVID-19 declaration under Section 564(b)(1) of the Act, 21 U.S.C.section 360bbb-3(b)(1), unless the authorization is terminated  or revoked sooner.       Influenza A by PCR NEGATIVE NEGATIVE Final   Influenza B by PCR NEGATIVE NEGATIVE Final    Comment: (NOTE) The Xpert Xpress SARS-CoV-2/FLU/RSV plus assay is intended as an aid in the diagnosis of influenza from Nasopharyngeal swab specimens and should not be used as a sole basis for treatment. Nasal washings and aspirates are unacceptable for Xpert Xpress SARS-CoV-2/FLU/RSV testing.  Fact Sheet for Patients: EntrepreneurPulse.com.au  Fact Sheet for Healthcare Providers: IncredibleEmployment.be  This test is not yet approved or cleared by the [place] FDA and has been authorized for detection and/or diagnosis of SARS-CoV-2 by FDA under an Emergency Use Authorization (EUA). This EUA will remain in effect (meaning this test can be used) for the duration of the COVID-19 declaration under Section 564(b)(1) of the Act, 21 U.S.C. section  360bbb-3(b)(1), unless the authorization is terminated or revoked.  Performed at [facility], [place] [address], [place], [place] [address]          Radiology Studies: DG Shoulder Left Port  Result Date: 03/03/2021 CLINICAL DATA:  Left shoulder pain. EXAM: LEFT SHOULDER COMPARISON:  Radiograph and CT 3 days ago 02/28/2021 FINDINGS: Unchanged alignment of minimally displaced proximal humeral fracture involving the surgical neck and lateral humeral head. No articular involvement. No new fracture. Normal acromioclavicular alignment. IMPRESSION: Unchanged alignment of minimally displaced proximal humeral fracture involving the surgical neck and lateral humeral head. Electronically Signed   By: Keith Rake M.D.   On: 03/03/2021 14:20        Scheduled Meds:  diltiazem  180 mg Oral Daily   famotidine  20 mg Oral BID   guaiFENesin  600 mg Oral BID   insulin aspart  0-5 Units Subcutaneous QHS   insulin aspart  0-9 Units Subcutaneous TID WC  latanoprost  1 drop Both Eyes QHS   magnesium hydroxide  30 mL Oral NOW   metoprolol succinate  100 mg Oral BID   PARoxetine  25 mg Oral Daily   polyethylene glycol  17 g Oral Daily   rosuvastatin  10 mg Oral QHS   torsemide  20 mg Oral BID   traZODone  50 mg Oral QHS   umeclidinium-vilanterol  1 puff Inhalation Daily   Continuous Infusions:   LOS: 4 days    Time spent: 39mns    JKathie Dike MD [facility]   If 7PM-7AM, please contact night-coverage [email or web address]  03/04/2021, 11:01 AM

## 2021-03-05 LAB — RENAL FUNCTION PANEL
Albumin: 3.1 g/dL — ABNORMAL LOW (ref 3.5–5.0)
Anion gap: 12 (ref 5–15)
BUN: 18 mg/dL (ref 8–23)
CO2: 20 mmol/L — ABNORMAL LOW (ref 22–32)
Calcium: 8.4 mg/dL — ABNORMAL LOW (ref 8.9–10.3)
Chloride: 91 mmol/L — ABNORMAL LOW (ref 98–111)
Creatinine, Ser: 0.93 mg/dL (ref 0.44–1.00)
GFR, Estimated: >60 mL/min (ref >60)
Glucose, Bld: 168 mg/dL — ABNORMAL HIGH (ref 70–99)
Phosphorus: 3.3 mg/dL (ref 2.5–4.6)
Potassium: 4.1 mmol/L (ref 3.5–5.1)
Sodium: 123 mmol/L — ABNORMAL LOW (ref 135–145)

## 2021-03-05 LAB — IRON AND TIBC
Iron: 46 ug/dL (ref 28–170)
Saturation Ratios: 12 % (ref 10.4–31.8)
TIBC: 386 ug/dL (ref 250–450)
UIBC: 340 ug/dL

## 2021-03-05 LAB — CBC
HCT: 23.4 % — ABNORMAL LOW (ref 36.0–46.0)
Hemoglobin: 8.1 g/dL — ABNORMAL LOW (ref 12.0–15.0)
MCH: 30.2 pg (ref 26.0–34.0)
MCHC: 34.6 g/dL (ref 30.0–36.0)
MCV: 87.3 fL (ref 80.0–100.0)
Platelets: 188 10*3/uL (ref 150–400)
RBC: 2.68 MIL/uL — ABNORMAL LOW (ref 3.87–5.11)
RDW: 12.8 % (ref 11.5–15.5)
WBC: 8.1 10*3/uL (ref 4.0–10.5)
nRBC: 0 % (ref 0.0–0.2)

## 2021-03-05 LAB — GLUCOSE, CAPILLARY
Glucose-Capillary: 177 mg/dL — ABNORMAL HIGH (ref 70–99)
Glucose-Capillary: 233 mg/dL — ABNORMAL HIGH (ref 70–99)
Glucose-Capillary: 227 mg/dL — ABNORMAL HIGH (ref 70–99)
Glucose-Capillary: 189 mg/dL — ABNORMAL HIGH (ref 70–99)
Glucose-Capillary: 212 mg/dL — ABNORMAL HIGH (ref 70–99)

## 2021-03-05 LAB — FERRITIN: Ferritin: 51 ng/mL (ref 11–307)

## 2021-03-05 MED ORDER — CYCLOBENZAPRINE HCL 5 MG PO TABS
5.0000 mg | ORAL_TABLET | Freq: Once | ORAL | Status: AC
  Administered 2021-03-05: 5 mg via ORAL
  Filled 2021-03-05: qty 1

## 2021-03-05 MED ORDER — SORBITOL 70 % SOLN
960.0000 mL | TOPICAL_OIL | Freq: Once | ORAL | Status: AC
  Administered 2021-03-05: 960 mL via RECTAL
  Filled 2021-03-05: qty 473

## 2021-03-05 MED ORDER — METHOCARBAMOL 500 MG PO TABS
500.0000 mg | ORAL_TABLET | Freq: Three times a day (TID) | ORAL | Status: DC
  Administered 2021-03-05: 500 mg via ORAL
  Filled 2021-03-05 (×2): qty 1

## 2021-03-05 MED ORDER — SODIUM CHLORIDE 0.9 % IV SOLN: INTRAVENOUS | Status: DC

## 2021-03-05 NOTE — Consult Note (Signed)
Renal Service Consult Note [facility]  [name] 03/05/2021 Janice Blazing, MD Requesting Physician: Dr. Roderic Palau  Reason for Consult: Hyponatremia HPI: The patient is a 70 y.o. year-old w/ hx of COPD, chronic diast CHF, permanent atrial fib, RLS, DM2 presented to ED after a fall and was found to have a left hip fracture and a L humerus fracture.  Per ortho plan is for nonoperative Rx. She was hyponatremic which improved w/ IVF's. Na was 128 on admit, imrpoved to 130 , then down to 125 on 7/23 and 123 today.  Asked to see for hyponatremia.        Admit May 2020 - a/c diast CHF, afib and vol overload > rx'd w/ IV lasix 40 bid, also had microcytic anemia, Hb 8's, FOBT neg, and low Na+ imrpoved w/ diuresis   Admit June 2020 - cardiogenic shock due to pericardial tamponadte, sp pericardiocentesis, then RHC which showed pHTN. Diuresed w/ IV lasix and dc'd on '40mg'$  bid torsemide for a few days then 20 mg bid. Had low Na+ to 113 rx'd w/ tolvaptan and IV lasix.    ROS - denies CP, no joint pain, no HA, no blurry vision, no rash, no diarrhea, no nausea/ vomiting, no dysuria, no difficulty voiding   Past Medical History History reviewed. No pertinent past medical history. Past Surgical History History reviewed. No pertinent surgical history. Family History History reviewed. No pertinent family history. Social History  reports that she has quit smoking. Her smoking use included cigarettes. She does not have any smokeless tobacco history on file. No history on file for alcohol use and drug use. Allergies  Allergies  Allergen Reactions   Benadryl [Diphenhydramine] Anxiety   Home medications Prior to Admission medications   Medication Sig Start Date End Date Taking? Authorizing Provider  ANORO ELLIPTA 62.5-25 MCG/INH AEPB Inhale 1 puff into the lungs daily. 12/13/20  Yes [provider]  Ascorbic Acid (VITAMIN C) 1000 MG tablet Take 1,000 mg by mouth daily.   Yes [provider]  celecoxib (CELEBREX) 200 MG capsule Take 200 mg by mouth 2 (two) times daily. 01/05/21  Yes [provider]  diltiazem (CARDIZEM CD) 180 MG 24 hr capsule Take 180 mg by mouth daily. 12/20/20  Yes [provider]  famotidine (PEPCID) 20 MG tablet Take 20 mg by mouth 2 (two) times daily. 02/20/21  Yes [provider]  guaiFENesin (MUCINEX) 600 MG 12 hr tablet Take 600 mg by mouth 2 (two) times daily.   Yes [provider]  HYDROcodone-acetaminophen (NORCO/VICODIN) 5-325 MG tablet Take 0.5 tablets by mouth at bedtime. pain 02/03/21  Yes [provider]  INVOKANA 100 MG TABS tablet Take 100 mg by mouth every morning. 02/24/21  Yes [provider]  JANUMET 50-1000 MG tablet Take 1 tablet by mouth 2 (two) times daily. 02/04/21  Yes [provider]  latanoprost (XALATAN) 0.005 % ophthalmic solution Place 1 drop into both eyes at bedtime. 02/07/21  Yes [provider]  LORazepam (ATIVAN) 0.5 MG tablet Take 0.5-1 mg by mouth at bedtime. 02/01/21  Yes [provider]  magnesium oxide (MAG-OX) 400 MG tablet Take 400 mg by mouth daily. 11/04/20  Yes [provider]  metoprolol succinate (TOPROL-XL) 100 MG 24 hr tablet Take 100 mg by mouth 2 (two) times daily. 02/24/21  Yes [provider]  metoprolol tartrate (LOPRESSOR) 25 MG tablet Take 25 mg by mouth daily as needed (if HR increases). 02/24/21  Yes  [provider]  Multiple Vitamin (MULTIVITAMIN) capsule Take 1 capsule by mouth daily.   Yes [provider]  nystatin ointment (MYCOSTATIN) Apply 1 application topically 2 (two) times daily as needed (rash).   Yes [provider]  PARoxetine (PAXIL) 10 MG tablet Take 25 mg by mouth daily. 02/24/21  Yes [provider]  potassium chloride (MICRO-K) 10 MEQ CR capsule Take 10 mEq by mouth 2 (two) times daily. 02/20/21  Yes [provider]  quinapril (ACCUPRIL) 40 MG  tablet Take 40 mg by mouth at bedtime. 12/12/20  Yes [provider]  rOPINIRole (REQUIP) 1 MG tablet Take 1 mg by mouth 3 (three) times daily as needed. Restless leg  1400, 2200 01/12/21  Yes [provider]  rosuvastatin (CRESTOR) 10 MG tablet Take 10 mg by mouth at bedtime. 01/06/21  Yes [provider]  torsemide (DEMADEX) 20 MG tablet Take 40 mg by mouth 2 (two) times daily. 11/30/20  Yes [provider]  traZODone (DESYREL) 100 MG tablet Take 50-100 mg by mouth at bedtime. 01/31/21  Yes [provider]  XARELTO 20 MG TABS tablet Take 20 mg by mouth daily. 02/01/21  Yes [provider]     Vitals:   03/04/21 2040 03/05/21 0749 03/05/21 0838 03/05/21 1326  BP: (!) 129/48 (!) 154/83  (!) 125/41  Pulse: 68 88  62  Resp: '16 19 16 18  '$ Temp: 98.4 F (36.9 C) 97.6 F (36.4 C)  98 F (36.7 C)  TempSrc: Oral   Oral  SpO2: 95% 90%  90%  Weight:      Height:       Exam Gen alert, no distress No rash, cyanosis or gangrene Sclera anicteric, throat clear  No jvd or bruits Chest clear bilat to bases, no rales/ wheezing RRR no MRG Abd soft ntnd no mass or ascites +bs GU normal MS no joint effusions or deformity Ext no LE or UE edema, no wounds or ulcers Neuro is alert, Ox 3 , nf       Home meds include celebrex, norco , invokana, janumet, Kdur anoro ellipta, Cardizem CD, pepcid, ativan prn, lopressor , paxil, accupril, requip, crestor, demadex 20- 40 bid, trazodone  BP's low normal on admit, then high and now wnl   HR 70-80, RR 16  afebrile  RA 90-95%  CXR 7/19 - IMPRESSION: 1. No acute cardiopulmonary disease. 2. Left humeral neck fracture   Admit wt - 81.6kg, no f/u wt's   I/O since admit 3.7 L in and 6.0 L out =  net negative 2.2 L   WBC 11 > 8K  Hb 11 >> 8.1  plts wnl   Got IVF's on admit x 24 hrs, then held.    Demadex held until restarted on 7/23    UNa 27, UOsm 252 on 03/01/21      TSH and cortisol wnl  Assessment/  Plan: Hyponatremia - w/ hx of same assoc w/ acute/ chronic diast CHF in the past, however here she does not show the signs of decomp diast CHF. I/O's here are negative,  she has no edema on exam , is on room air and had normal CXR on 7/19.  UNa and Uosm are both equivocal. Renal fxn is normal.  Na seems to be getting worse w/ diuretics here. Will dc torsemide and start gentle IVF"s w/ normal saline at 75 cc/hr. Moderate fluid restriction to 1200 for now.  Will repeat urine lytes and get updated weights and  f/u serum Na in am.  Fall / L humerus fx / L hip fracture H/o chronic diast CHF COPD Permanent atrial fib DM2      Rob Elic Vencill  MD 03/05/2021, 5:08 PM  Recent Labs  Lab 03/04/21 0309 03/05/21 0207  WBC 9.5 8.1  HGB 8.1* 8.1*   Recent Labs  Lab 03/01/21 0018 03/02/21 0228 03/04/21 0309 03/05/21 0207  K 5.2*   < > 4.3 4.1  BUN 23   < > 14 18  CREATININE 1.50*   < > 0.98 0.93  CALCIUM 8.8*   < > 8.8* 8.4*  PHOS 4.5  --   --  3.3   < > = values in this interval not displayed.

## 2021-03-05 NOTE — Progress Notes (Addendum)
PROGRESS NOTE    [name]  [id number] DOB: 05/13/1951 DOA: 02/28/2021 PCP: Hoyt Koch, MD    Brief Narrative:  70 year old female with a history of atrial fibrillation on anticoagulation, diastolic CHF, COPD, diabetes, presents to the emergency room after having an unwitnessed fall.  She was found to have a left humerus fracture as well as left acetabular fracture.  She was seen by orthopedics with recommendations for nonoperative management.  She was also noted to be hyponatremic which resolved with IV fluids.  Plan is for skilled nursing facility placement   Assessment & Plan:   Active Problems:   Fall   COPD (chronic obstructive pulmonary disease) (HCC)   Closed fracture of left proximal humerus   Left acetabular fracture (HCC)   Chronic diastolic CHF (congestive heart failure) (HCC)   Hyponatremia   AKI (acute kidney injury) ([place])   Permanent atrial fibrillation (HCC)   Restless leg syndrome   DM (diabetes mellitus), type 2 (HCC)   Proximal left humerus fracture, Left acetabular fracture. -seen by ortho, who also discussed case with trauma specialist -Recommendations are for nonoperative management at this point -She is to be nonweightbearing left upper extremity and touchdown weightbearing of the left lower extremity -Plans were to follow-up with orthopedics as an outpatient -Continue pain management -add robaxin for muscle spasms -Seen by physical therapy initially recommended CIR, but it was not felt that she was an appropriate candidate for CIR -Plan will be for skilled nursing facility placement    Permanent A fib -HR stable on toprol and diltiazem -she was anticoagulated with xarelto, but this is being held due to anemia and concerns for bleeding.  Anemia -chronic -Hemoglobin has trended down since admission from 11.2 to 8.1  -no evidence of GI bleeding -may have a component of hemodilution with Iv fluids -may also have a component of acute  blood loss with extensive bruising/hematoma noted in left arm -continue to monitor  Left arm hematoma -related to recent injury in the setting of anticoagulation -holding further anticoagulation for now -will apply ice to left arm -currently hemoglobin stable at 8.1 -continue to monitor hemoglobin   Hyponatremia -chronic issue dating back to 2020 -Serum sodium of 126 on admission -appears to be euvolemic -she has been placed on fluid restriction -TSH and cortisol normal -Initially improved with saline to 130, but now back down to 123 -serum osm 278 -urine sodium 27 -urine osm 252 -will request nephrology input -repeat in AM   DM2 -A1c 7.1 -chronically on jamumet and invokana -continue on SSI -blood sugars stable   Chronic anxiety/depresison -continue on paxil   Restless leg syndrome -continue on requip -Follow-up with neurology as an outpatient   HLD -continue on statin   Chronic diastolic chf -currently appears compensated -continue on torsemide   COPD -no wheezing or shortness of breath at this time -currently on RA -continue on Anoro -Follow-up with pulmonology as an outpatient  AKI -creatinine up to 1.5 -held diuretics and gave gentle hydration -creatinine down to 0.98  Constipation -continue on daily miralax -patient requesting enema today   DVT prophylaxis: Place and maintain sequential compression device Start: 03/04/21 1046 SCDs Start: 02/28/21 1933  Code Status: Full code Family Communication: Discussed with patient at the bedside Disposition Plan: Status is: Inpatient  Remains inpatient appropriate because:Ongoing active pain requiring inpatient pain management, Unsafe d/c plan, and Inpatient level of care appropriate due to severity of illness  Dispo:  Patient From: Home  Planned Disposition: Skilled Nursing  Facility  Medically stable for discharge: Yes       Consultants:  Orthopedics  Procedures:    Antimicrobials:       Subjective: Feels that her left leg was stiff and difficult to move. Staff massaged her leg and she felt improved. Continues to have pain in left shoulder. No BM yesterday  Objective: Vitals:   03/04/21 1500 03/04/21 2040 03/05/21 0749 03/05/21 0838  BP: (!) 124/48 (!) 129/48 (!) 154/83   Pulse: 88 68 88   Resp: '16 16 19 16  '$ Temp: 98.2 F (36.8 C) 98.4 F (36.9 C) 97.6 F (36.4 C)   TempSrc: Oral Oral    SpO2: 98% 95% 90%   Weight:      Height:        Intake/Output Summary (Last 24 hours) at 03/05/2021 1224 Last data filed at 03/05/2021 0132 Gross per 24 hour  Intake 1080 ml  Output 900 ml  Net 180 ml   Filed Weights   02/28/21 1134  Weight: 81.6 kg    Examination:  General exam: Alert, awake, oriented x 3 Respiratory system: Clear to auscultation. Respiratory effort normal. Cardiovascular system:irregular. No murmurs, rubs, gallops. Gastrointestinal system: Abdomen is nondistended, soft and nontender. No organomegaly or masses felt. Normal bowel sounds heard. Central nervous system: Alert and oriented. No focal neurological deficits. Extremities: left arm in sling, extensive bruising and swelling in left arm Skin: No rashes, lesions or ulcers Psychiatry: Judgement and insight appear normal. Mood & affect appropriate.     Data Reviewed: I have personally reviewed following labs and imaging studies  CBC: Recent Labs  Lab 02/28/21 1136 03/01/21 0018 03/02/21 0228 03/04/21 0309 03/05/21 0207  WBC 7.5 11.4* 11.4* 9.5 8.1  NEUTROABS 5.8  --   --   --   --   HGB 11.2* 9.3* 9.6* 8.1* 8.1*  HCT 32.3* 27.8* 27.1* 23.9* 23.4*  MCV 87.1 87.7 85.5 87.2 87.3  PLT 212 209 166 177 0000000   Basic Metabolic Panel: Recent Labs  Lab 02/28/21 1136 03/01/21 0018 03/02/21 0228 03/04/21 0309 03/05/21 0207  NA 126* 128* 130* 125* 123*  K 4.5 5.2* 4.5 4.3 4.1  CL 91* 96* 97* 94* 91*  CO2 '26 24 24 23 '$ 20*  GLUCOSE 183* 160* 153* 162* 168*  BUN '21 23 13 14 18   '$ CREATININE 1.20* 1.50* 1.01* 0.98 0.93  CALCIUM 9.3 8.8* 9.0 8.8* 8.4*  MG  --  1.7  --   --   --   PHOS  --  4.5  --   --  3.3   GFR: Estimated Creatinine Clearance: 54.1 mL/min (by C-G formula based on SCr of 0.93 mg/dL). Liver Function Tests: Recent Labs  Lab 02/28/21 1136 03/05/21 0207  AST 25  --   ALT 16  --   ALKPHOS 55  --   BILITOT 0.8  --   PROT 7.5  --   ALBUMIN 4.3 3.1*   No results for input(s): LIPASE, AMYLASE in the last 168 hours. No results for input(s): AMMONIA in the last 168 hours. Coagulation Profile: No results for input(s): INR, PROTIME in the last 168 hours. Cardiac Enzymes: No results for input(s): CKTOTAL, CKMB, CKMBINDEX, TROPONINI in the last 168 hours. BNP (last 3 results) No results for input(s): PROBNP in the last 8760 hours. HbA1C: No results for input(s): HGBA1C in the last 72 hours.  CBG: Recent Labs  Lab 03/04/21 1142 03/04/21 1647 03/04/21 2037 03/05/21 0630 03/05/21 1105  GLUCAP 196* 187*  230* 177* 233*   Lipid Profile: No results for input(s): CHOL, HDL, LDLCALC, TRIG, CHOLHDL, LDLDIRECT in the last 72 hours. Thyroid Function Tests: No results for input(s): TSH, T4TOTAL, FREET4, T3FREE, THYROIDAB in the last 72 hours.  Anemia Panel: Recent Labs    03/05/21 0207  FERRITIN 51  TIBC 386  IRON 46   Sepsis Labs: No results for input(s): PROCALCITON, LATICACIDVEN in the last 168 hours.  Recent Results (from the past 240 hour(s))  Resp Panel by RT-PCR (Flu A&B, Covid) Nasopharyngeal Swab     Status: None   Collection Time: 02/28/21  7:06 PM   Specimen: Nasopharyngeal Swab; Nasopharyngeal(NP) swabs in vial transport medium  Result Value Ref Range Status   SARS Coronavirus 2 by RT PCR NEGATIVE NEGATIVE Final    Comment: (NOTE) SARS-CoV-2 target nucleic acids are NOT DETECTED.  The SARS-CoV-2 RNA is generally detectable in upper respiratory specimens during the acute phase of infection. The lowest concentration of  SARS-CoV-2 viral copies this assay can detect is 138 copies/mL. A negative result does not preclude SARS-Cov-2 infection and should not be used as the sole basis for treatment or other patient management decisions. A negative result may occur with  improper specimen collection/handling, submission of specimen other than nasopharyngeal swab, presence of viral mutation(s) within the areas targeted by this assay, and inadequate number of viral copies(<138 copies/mL). A negative result must be combined with clinical observations, patient history, and epidemiological information. The expected result is Negative.  Fact Sheet for Patients:  EntrepreneurPulse.com.au  Fact Sheet for Healthcare Providers:  IncredibleEmployment.be  This test is no t yet approved or cleared by the [place] FDA and  has been authorized for detection and/or diagnosis of SARS-CoV-2 by FDA under an Emergency Use Authorization (EUA). This EUA will remain  in effect (meaning this test can be used) for the duration of the COVID-19 declaration under Section 564(b)(1) of the Act, 21 U.S.C.section 360bbb-3(b)(1), unless the authorization is terminated  or revoked sooner.       Influenza A by PCR NEGATIVE NEGATIVE Final   Influenza B by PCR NEGATIVE NEGATIVE Final    Comment: (NOTE) The Xpert Xpress SARS-CoV-2/FLU/RSV plus assay is intended as an aid in the diagnosis of influenza from Nasopharyngeal swab specimens and should not be used as a sole basis for treatment. Nasal washings and aspirates are unacceptable for Xpert Xpress SARS-CoV-2/FLU/RSV testing.  Fact Sheet for Patients: EntrepreneurPulse.com.au  Fact Sheet for Healthcare Providers: IncredibleEmployment.be  This test is not yet approved or cleared by the [place] FDA and has been authorized for detection and/or diagnosis of SARS-CoV-2 by FDA under an Emergency Use  Authorization (EUA). This EUA will remain in effect (meaning this test can be used) for the duration of the COVID-19 declaration under Section 564(b)(1) of the Act, 21 U.S.C. section 360bbb-3(b)(1), unless the authorization is terminated or revoked.  Performed at [facility], [place] [address], [place], [place] [address]          Radiology Studies: No results found.      Scheduled Meds:  diltiazem  180 mg Oral Daily   famotidine  20 mg Oral BID   guaiFENesin  600 mg Oral BID   insulin aspart  0-5 Units Subcutaneous QHS   insulin aspart  0-9 Units Subcutaneous TID WC   latanoprost  1 drop Both Eyes QHS   methocarbamol  500 mg Oral TID   metoprolol succinate  100 mg Oral BID   PARoxetine  25 mg Oral Daily   polyethylene glycol  17 g Oral Daily   rosuvastatin  10 mg Oral QHS   sorbitol, milk of mag, mineral oil, glycerin (SMOG) enema  960 mL Rectal Once   torsemide  20 mg Oral BID   traZODone  50 mg Oral QHS   umeclidinium-vilanterol  1 puff Inhalation Daily   Continuous Infusions:   LOS: 5 days    Time spent: 50mns    JKathie Dike MD [facility]   If 7PM-7AM, please contact night-coverage [email or web address]  03/05/2021, 12:24 PM

## 2021-03-05 NOTE — Plan of Care (Signed)

## 2021-03-06 LAB — GLUCOSE, CAPILLARY
Glucose-Capillary: 191 mg/dL — ABNORMAL HIGH (ref 70–99)
Glucose-Capillary: 250 mg/dL — ABNORMAL HIGH (ref 70–99)
Glucose-Capillary: 208 mg/dL — ABNORMAL HIGH (ref 70–99)
Glucose-Capillary: 183 mg/dL — ABNORMAL HIGH (ref 70–99)

## 2021-03-06 LAB — CBC
HCT: 24.7 % — ABNORMAL LOW (ref 36.0–46.0)
Hemoglobin: 8.7 g/dL — ABNORMAL LOW (ref 12.0–15.0)
MCH: 30.1 pg (ref 26.0–34.0)
MCHC: 35.2 g/dL (ref 30.0–36.0)
MCV: 85.5 fL (ref 80.0–100.0)
Platelets: 203 10*3/uL (ref 150–400)
RBC: 2.89 MIL/uL — ABNORMAL LOW (ref 3.87–5.11)
RDW: 12.9 % (ref 11.5–15.5)
WBC: 8.9 10*3/uL (ref 4.0–10.5)
nRBC: 0 % (ref 0.0–0.2)

## 2021-03-06 LAB — BASIC METABOLIC PANEL
Anion gap: 11 (ref 5–15)
BUN: 15 mg/dL (ref 8–23)
CO2: 22 mmol/L (ref 22–32)
Calcium: 8.5 mg/dL — ABNORMAL LOW (ref 8.9–10.3)
Chloride: 93 mmol/L — ABNORMAL LOW (ref 98–111)
Creatinine, Ser: 0.91 mg/dL (ref 0.44–1.00)
GFR, Estimated: >60 mL/min (ref >60)
Glucose, Bld: 235 mg/dL — ABNORMAL HIGH (ref 70–99)
Potassium: 3.9 mmol/L (ref 3.5–5.1)
Sodium: 126 mmol/L — ABNORMAL LOW (ref 135–145)

## 2021-03-06 MED ORDER — CYCLOBENZAPRINE HCL 5 MG PO TABS
5.0000 mg | ORAL_TABLET | Freq: Three times a day (TID) | ORAL | Status: DC | PRN
  Filled 2021-03-06: qty 1

## 2021-03-06 MED ORDER — LORAZEPAM 0.5 MG PO TABS
0.5000 mg | ORAL_TABLET | Freq: Three times a day (TID) | ORAL | Status: DC | PRN
  Administered 2021-03-06: 0.5 mg via ORAL
  Filled 2021-03-06: qty 1

## 2021-03-06 MED ORDER — AMOXICILLIN 500 MG PO CAPS
500.0000 mg | ORAL_CAPSULE | Freq: Three times a day (TID) | ORAL | Status: DC
  Administered 2021-03-06 – 2021-03-07 (×4): 500 mg via ORAL
  Filled 2021-03-06 (×6): qty 1

## 2021-03-06 MED ORDER — AMOXICILLIN 250 MG PO CHEW: 875.0000 mg | CHEWABLE_TABLET | Freq: Two times a day (BID) | ORAL | Status: DC

## 2021-03-06 NOTE — TOC Progression Note (Addendum)
Transition of Care [facility]) - Progression Note    Patient Details  Name: OZELLE MILK MRN: [id number] Date of Birth: 1950-09-06  Transition [facility]) CM/SW Contact  Sharin Mons, RN Phone Number: 03/06/2021, 10:18 AM  Clinical Narrative:    Once medically ready d/c plan: SNF placement. SNF  bed offers presented to  pt and wife Edd Fabian.Marland KitchenMarland KitchenNCM to f/u with an answer. Wife is going to tour [facility]  today.Voice message letft with [employer] to initiate insurance authorization.  TOC team will continue to monitor and assess with needs...  Expected Discharge Plan: Skilled Nursing Facility Barriers to Discharge: Ship broker, Continued Medical Work up  Expected Discharge Plan and Services Expected Discharge Plan: [place]                                               Social Determinants of Health (SDOH) Interventions    Readmission Risk Interventions No flowsheet data found.

## 2021-03-06 NOTE — Progress Notes (Signed)
[facility] Progress Note   70 y.o. year-old woman hx of COPD, dCHF,  atrial fib, RLS, DM2 presented after a fall and was found to have a left hip fracture and a L humerus fracture.  Per ortho plan is for nonoperative Rx. She was hyponatremic which improved w/ IVF's. Na was 128 on admit, imrpoved to 130 , then down to 125 on 7/23 and 123 today.  Asked to see for hyponatremia.      Home meds include celebrex, norco , invokana, janumet, Kdur anoro ellipta, Cardizem CD, pepcid, ativan prn, lopressor , paxil, accupril, requip, crestor, demadex 20- 40 bid, trazodone  Assessment/ Plan:   Hyponatremia - w/ hx of same assoc w/ acute/ chronic diast CHF in the past, however here she does not show the signs of decomp diast CHF. I/O's here are negative,  she has no edema on exam , is on room air and had normal CXR on 7/19.  UNa and Uosm are both equivocal. Renal fxn is normal.  Na seems to be getting worse w/ diuretics here. Will dc torsemide and start gentle IVF"s w/ normal saline at 75 cc/hr. Moderate fluid restriction to 1200 for now.  Torsemide '20mg'$  BID 7/23 - 7/24 and also on Paxil. - Na slowly increasing from 123 -> 126 w/ stable renal function - Need daily weights (last weight was 81.6 kg on 7/19). Total net neg 1959 during this hospitalization - Stopping the IVF after this current liter is completed.  Fall / L humerus fx / L hip fracture H/o chronic diast CHF COPD Permanent atrial fib DM2  Subjective:   Main complaint is left leg / hip pain and spasm. Denies nausea but has had a single episode of dizziness. Left ear fullness also but denies fever/ chills/ dyspnea.   Objective:   BP (!) 122/91 (BP Location: Right Arm)   Pulse 89   Temp 98.9 F (37.2 C) (Oral)   Resp 18   Ht '5\' 2"'$  (1.575 m)   Wt 81.6 kg   SpO2 96%   BMI 32.92 kg/m   Intake/Output Summary (Last 24 hours) at 03/06/2021 1322 Last data filed at 03/06/2021 1100 Gross per 24 hour  Intake 1316.25 ml  Output  1000 ml  Net 316.25 ml   Weight change:   Physical Exam: Gen alert, no distress Sclera anicteric, throat clear No jvd or bruits Chest clear bilat to bases, no rales/ wheezing RRR no MRG Abd soft ntnd no mass or ascites +bs Ext no LE or UE edema, no wounds or ulcers Neuro is alert, Ox 3 , nf  Imaging: No results found.  Labs: BMET Recent Labs  Lab 02/28/21 1136 03/01/21 0018 03/02/21 0228 03/04/21 0309 03/05/21 0207 03/06/21 0152  NA 126* 128* 130* 125* 123* 126*  K 4.5 5.2* 4.5 4.3 4.1 3.9  CL 91* 96* 97* 94* 91* 93*  CO2 '26 24 24 23 '$ 20* 22  GLUCOSE 183* 160* 153* 162* 168* 235*  BUN '21 23 13 14 18 15  '$ CREATININE 1.20* 1.50* 1.01* 0.98 0.93 0.91  CALCIUM 9.3 8.8* 9.0 8.8* 8.4* 8.5*  PHOS  --  4.5  --   --  3.3  --    CBC Recent Labs  Lab 02/28/21 1136 03/01/21 0018 03/02/21 0228 03/04/21 0309 03/05/21 0207 03/06/21 0152  WBC 7.5   < > 11.4* 9.5 8.1 8.9  NEUTROABS 5.8  --   --   --   --   --   HGB 11.2*   < >  9.6* 8.1* 8.1* 8.7*  HCT 32.3*   < > 27.1* 23.9* 23.4* 24.7*  MCV 87.1   < > 85.5 87.2 87.3 85.5  PLT 212   < > 166 177 188 203   < > = values in this interval not displayed.    Medications:     amoxicillin  500 mg Oral Q8H   diltiazem  180 mg Oral Daily   famotidine  20 mg Oral BID   guaiFENesin  600 mg Oral BID   insulin aspart  0-5 Units Subcutaneous QHS   insulin aspart  0-9 Units Subcutaneous TID WC   latanoprost  1 drop Both Eyes QHS   metoprolol succinate  100 mg Oral BID   PARoxetine  25 mg Oral Daily   polyethylene glycol  17 g Oral Daily   rosuvastatin  10 mg Oral QHS   traZODone  50 mg Oral QHS   umeclidinium-vilanterol  1 puff Inhalation Daily      Otelia Santee, MD 03/06/2021, 1:22 PM

## 2021-03-06 NOTE — Plan of Care (Signed)
  Problem: Clinical Measurements: Goal: Cardiovascular complication will be avoided Outcome: Progressing   Problem: Clinical Measurements: Goal: Diagnostic test results will improve Outcome: Progressing   Problem: Clinical Measurements: Goal: Ability to maintain clinical measurements within normal limits will improve Outcome: Progressing   Problem: Safety: Goal: Ability to remain free from injury will improve Outcome: Progressing   Problem: Skin Integrity: Goal: Risk for impaired skin integrity will decrease Outcome: Progressing   Problem: Elimination: Goal: Will not experience complications related to bowel motility Outcome: Progressing Goal: Will not experience complications related to urinary retention Outcome: Progressing

## 2021-03-06 NOTE — Progress Notes (Addendum)
PROGRESS NOTE    [name]  [id number] DOB: 09/24/1950 DOA: 02/28/2021 PCP: Hoyt Koch, MD    Brief Narrative:  71 year old female with a history of atrial fibrillation on anticoagulation, diastolic CHF, COPD, diabetes, presents to the emergency room after having an unwitnessed fall.  She was found to have a left humerus fracture as well as left acetabular fracture.  She was seen by orthopedics with recommendations for nonoperative management.  She was also noted to be hyponatremic which resolved with IV fluids.  Plan is for skilled nursing facility placement   Assessment & Plan:   Active Problems:   Fall   COPD (chronic obstructive pulmonary disease) (HCC)   Closed fracture of left proximal humerus   Left acetabular fracture (HCC)   Chronic diastolic CHF (congestive heart failure) (HCC)   Hyponatremia   AKI (acute kidney injury) ([place])   Permanent atrial fibrillation (HCC)   Restless leg syndrome   DM (diabetes mellitus), type 2 (HCC)   Proximal left humerus fracture, Left acetabular fracture. -seen by ortho, who also discussed case with trauma specialist -Recommendations are for nonoperative management at this point -She is to be nonweightbearing left upper extremity and touchdown weightbearing of the left lower extremity -Plans were to follow-up with orthopedics as an outpatient -Continue pain management -add robaxin for muscle spasms -Seen by physical therapy initially recommended CIR, but it was not felt that she was an appropriate candidate for CIR -Plan will be for skilled nursing facility placement    Permanent A fib -HR stable on toprol and diltiazem -she was anticoagulated with xarelto, but this is being held due to anemia and concerns for bleeding.  Anemia -chronic -Hemoglobin has trended down since admission from 11.2 to 8.1  -no evidence of GI bleeding -may have a component of hemodilution with Iv fluids -may also have a component of acute  blood loss with extensive bruising/hematoma noted in left arm -continue to monitor  Left arm hematoma -related to recent injury in the setting of anticoagulation -holding further anticoagulation for now -will apply ice to left arm -currently hemoglobin stable at 8.7 -continue to monitor hemoglobin   Hyponatremia -chronic issue dating back to 2020 -Serum sodium of 126 on admission -appears to be euvolemic -she has been placed on fluid restriction -TSH and cortisol normal -Initially improved with saline to 130, but then back down to 123 -serum osm 278 -urine sodium 27 -urine osm 252 -Appreciate nephrology assistance -Started on IV fluids -Sodium up to 126 today, continue to follow   DM2 -A1c 7.1 -chronically on jamumet and invokana -continue on SSI -blood sugars stable   Chronic anxiety/depresison -continue on paxil   Restless leg syndrome -continue on requip -Follow-up with neurology as an outpatient   HLD -continue on statin   Chronic diastolic chf -currently appears compensated -Torsemide on hold   COPD -no wheezing or shortness of breath at this time -currently on RA -continue on Anoro -Follow-up with pulmonology as an outpatient  AKI -creatinine up to 1.5 -held diuretics and gave gentle hydration -creatinine down to 0.98  Constipation -continue on daily miralax -Resolved after receiving enema  Dental infection -Swelling noted around right upper teeth/gums -We will start a short course of amoxicillin   DVT prophylaxis: Place and maintain sequential compression device Start: 03/04/21 1046 SCDs Start: 02/28/21 1933  Code Status: Full code Family Communication: Discussed with patient at the bedside Disposition Plan: Status is: Inpatient  Remains inpatient appropriate because:Ongoing active pain requiring inpatient pain management,  Unsafe d/c plan, and Inpatient level of care appropriate due to severity of illness  Dispo:  Patient From:  Home  Planned Disposition: [place]  Medically stable for discharge: Yes       Consultants:  Orthopedics Nephrology  Procedures:    Antimicrobials:      Subjective: Complains of cramping in her legs.  She did have a bowel movement yesterday.  Feels as though she is developing a dental infection and is requesting amoxicillin  Objective: Vitals:   03/05/21 1958 03/06/21 0754 03/06/21 0840 03/06/21 1500  BP: (!) 155/62 (!) 122/91  (!) 146/77  Pulse: 74 89  83  Resp: '16 18  17  '$ Temp: 99.8 F (37.7 C) 98.9 F (37.2 C)  98.2 F (36.8 C)  TempSrc: Oral Oral  Oral  SpO2: 92% 96% 96% 95%  Weight:      Height:        Intake/Output Summary (Last 24 hours) at 03/06/2021 1941 Last data filed at 03/06/2021 1654 Gross per 24 hour  Intake 2298.53 ml  Output 1000 ml  Net 1298.53 ml   Filed Weights   02/28/21 1134  Weight: 81.6 kg    Examination:  General exam: Alert, awake, oriented x 3 Respiratory system: Clear to auscultation. Respiratory effort normal. Cardiovascular system:irregular. No murmurs, rubs, gallops. Gastrointestinal system: Abdomen is nondistended, soft and nontender. No organomegaly or masses felt. Normal bowel sounds heard. Central nervous system: Alert and oriented. No focal neurological deficits. Extremities: left arm in sling, extensive bruising and swelling in left arm Skin: No rashes, lesions or ulcers Psychiatry: Judgement and insight appear normal. Mood & affect appropriate.     Data Reviewed: I have personally reviewed following labs and imaging studies  CBC: Recent Labs  Lab 02/28/21 1136 03/01/21 0018 03/02/21 0228 03/04/21 0309 03/05/21 0207 03/06/21 0152  WBC 7.5 11.4* 11.4* 9.5 8.1 8.9  NEUTROABS 5.8  --   --   --   --   --   HGB 11.2* 9.3* 9.6* 8.1* 8.1* 8.7*  HCT 32.3* 27.8* 27.1* 23.9* 23.4* 24.7*  MCV 87.1 87.7 85.5 87.2 87.3 85.5  PLT 212 209 166 177 [id number]   Basic Metabolic Panel: Recent Labs  Lab  03/01/21 0018 03/02/21 0228 03/04/21 0309 03/05/21 0207 03/06/21 0152  NA 128* 130* 125* 123* 126*  K 5.2* 4.5 4.3 4.1 3.9  CL 96* 97* 94* 91* 93*  CO2 '24 24 23 '$ 20* 22  GLUCOSE 160* 153* 162* 168* 235*  BUN '23 13 14 18 15  '$ CREATININE 1.50* 1.01* 0.98 0.93 0.91  CALCIUM 8.8* 9.0 8.8* 8.4* 8.5*  MG 1.7  --   --   --   --   PHOS 4.5  --   --  3.3  --    GFR: Estimated Creatinine Clearance: 55.3 mL/min (by C-G formula based on SCr of 0.91 mg/dL). Liver Function Tests: Recent Labs  Lab 02/28/21 1136 03/05/21 0207  AST 25  --   ALT 16  --   ALKPHOS 55  --   BILITOT 0.8  --   PROT 7.5  --   ALBUMIN 4.3 3.1*   No results for input(s): LIPASE, AMYLASE in the last 168 hours. No results for input(s): AMMONIA in the last 168 hours. Coagulation Profile: No results for input(s): INR, PROTIME in the last 168 hours. Cardiac Enzymes: No results for input(s): CKTOTAL, CKMB, CKMBINDEX, TROPONINI in the last 168 hours. BNP (last 3 results) No results for input(s): PROBNP in  the last 8760 hours. HbA1C: No results for input(s): HGBA1C in the last 72 hours.  CBG: Recent Labs  Lab 03/05/21 2000 03/05/21 2249 03/06/21 0703 03/06/21 1155 03/06/21 1545  GLUCAP 189* 212* 191* 250* 208*   Lipid Profile: No results for input(s): CHOL, HDL, LDLCALC, TRIG, CHOLHDL, LDLDIRECT in the last 72 hours. Thyroid Function Tests: No results for input(s): TSH, T4TOTAL, FREET4, T3FREE, THYROIDAB in the last 72 hours.  Anemia Panel: Recent Labs    03/05/21 0207  FERRITIN 51  TIBC 386  IRON 46   Sepsis Labs: No results for input(s): PROCALCITON, LATICACIDVEN in the last 168 hours.  Recent Results (from the past 240 hour(s))  Resp Panel by RT-PCR (Flu A&B, Covid) Nasopharyngeal Swab     Status: None   Collection Time: 02/28/21  7:06 PM   Specimen: Nasopharyngeal Swab; Nasopharyngeal(NP) swabs in vial transport medium  Result Value Ref Range Status   SARS Coronavirus 2 by RT PCR NEGATIVE  NEGATIVE Final    Comment: (NOTE) SARS-CoV-2 target nucleic acids are NOT DETECTED.  The SARS-CoV-2 RNA is generally detectable in upper respiratory specimens during the acute phase of infection. The lowest concentration of SARS-CoV-2 viral copies this assay can detect is 138 copies/mL. A negative result does not preclude SARS-Cov-2 infection and should not be used as the sole basis for treatment or other patient management decisions. A negative result may occur with  improper specimen collection/handling, submission of specimen other than nasopharyngeal swab, presence of viral mutation(s) within the areas targeted by this assay, and inadequate number of viral copies(<138 copies/mL). A negative result must be combined with clinical observations, patient history, and epidemiological information. The expected result is Negative.  Fact Sheet for Patients:  EntrepreneurPulse.com.au  Fact Sheet for Healthcare Providers:  IncredibleEmployment.be  This test is no t yet approved or cleared by the [place] FDA and  has been authorized for detection and/or diagnosis of SARS-CoV-2 by FDA under an Emergency Use Authorization (EUA). This EUA will remain  in effect (meaning this test can be used) for the duration of the COVID-19 declaration under Section 564(b)(1) of the Act, 21 U.S.C.section 360bbb-3(b)(1), unless the authorization is terminated  or revoked sooner.       Influenza A by PCR NEGATIVE NEGATIVE Final   Influenza B by PCR NEGATIVE NEGATIVE Final    Comment: (NOTE) The Xpert Xpress SARS-CoV-2/FLU/RSV plus assay is intended as an aid in the diagnosis of influenza from Nasopharyngeal swab specimens and should not be used as a sole basis for treatment. Nasal washings and aspirates are unacceptable for Xpert Xpress SARS-CoV-2/FLU/RSV testing.  Fact Sheet for Patients: EntrepreneurPulse.com.au  Fact Sheet for Healthcare  Providers: IncredibleEmployment.be  This test is not yet approved or cleared by the [place] FDA and has been authorized for detection and/or diagnosis of SARS-CoV-2 by FDA under an Emergency Use Authorization (EUA). This EUA will remain in effect (meaning this test can be used) for the duration of the COVID-19 declaration under Section 564(b)(1) of the Act, 21 U.S.C. section 360bbb-3(b)(1), unless the authorization is terminated or revoked.  Performed at [facility], [place] [address], [place], [place] [address]          Radiology Studies: No results found.      Scheduled Meds:  amoxicillin  500 mg Oral Q8H   diltiazem  180 mg Oral Daily   famotidine  20 mg Oral BID   guaiFENesin  600 mg Oral BID   insulin aspart  0-5  Units Subcutaneous QHS   insulin aspart  0-9 Units Subcutaneous TID WC   latanoprost  1 drop Both Eyes QHS   metoprolol succinate  100 mg Oral BID   PARoxetine  25 mg Oral Daily   polyethylene glycol  17 g Oral Daily   rosuvastatin  10 mg Oral QHS   traZODone  50 mg Oral QHS   umeclidinium-vilanterol  1 puff Inhalation Daily   Continuous Infusions:  sodium chloride 75 mL/hr at 03/06/21 1333     LOS: 6 days    Time spent: 41mns    JKathie Dike MD [facility]   If 7PM-7AM, please contact night-coverage [email or web address]  03/06/2021, 7:41 PM

## 2021-03-06 NOTE — Progress Notes (Signed)
Inpatient Diabetes Program Recommendations  AACE/ADA: New Consensus Statement on Inpatient Glycemic Control (2015)  Target Ranges:  Prepandial:   less than 140 mg/dL      Peak postprandial:   less than 180 mg/dL (1-2 hours)      Critically ill patients:  140 - 180 mg/dL   Lab Results  Component Value Date   GLUCAP 250 (H) 03/06/2021   HGBA1C 7.1 (H) 02/28/2021    Review of Glycemic Control  Results for SHIYA, KESSELRING (MRN [id number]) as of 03/06/2021 14:01  Ref. Range 03/05/2021 06:30 03/05/2021 11:05 03/05/2021 16:45 03/05/2021 20:00 03/05/2021 22:49 03/06/2021 07:03 03/06/2021 11:55  Glucose-Capillary Latest Ref Range: 70 - 99 mg/dL 177 (H) 233 (H) 227 (H) 189 (H) 212 (H) 191 (H) 250 (H)    Diabetes history: DM2 Outpatient Diabetes medications: Invokana 100 mg QD, Janumet 50-1000 mg BID Current orders for Inpatient glycemic control: Novolog 0-9 units TID And 0-5 units QHS  Inpatient Diabetes Program Recommendations:    Novolog 2-3 units TID with meals if eats at least 50%.  Will continue to follow while inpatient.  Thank you, Reche Dixon, RN, BSN Diabetes Coordinator Inpatient Diabetes Program [phone] (team pager from 8a-5p)

## 2021-03-06 NOTE — Progress Notes (Signed)
Physical Therapy Treatment Patient Details Name: [name] MRN: [id number] DOB: September 10, 1950 Today's Date: 03/06/2021    History of Present Illness [name] is a 70 y.o. female who presented to [facility] ED from home after an unwitnessed fall out of bed. Pt presents with minimally displaced fracture involving the surgical neck of the proximal left humerus, and Acute nondisplaced fracture involving posterior wall of the acetabulum with extension of lucency to the articular surface (non-surgical management for both). Medical history significant for chronic hyponatremia, permanent A. fib on Eliquis, chronic diastolic CHF, COPD, type 2 diabetes, GERD, hypertension, hyperlipidemia, spinal stenosis    PT Comments    Pt crying upon PT arrival, states she has been having severe L quad spasms since last night and worse over the last 2 hours. PT provided gentle STM to L quad as well as ischemic hold of muscle belly during spasms, both of which helped control spasms and pain. Pt tolerated move to EOB and scoot pivot to drop arm recliner towards R with mod-max +2 assist, multiple start/stops of mobility required due to pt pain. PT continuing to recommend SNF level of care, will continue to follow.     Follow Up Recommendations  Supervision for mobility/OOB;SNF     Equipment Recommendations  None recommended by PT    Recommendations for Other Services       Precautions / Restrictions Precautions Precautions: Fall Precaution Comments: posterior hip precautions reviewed given acetabular fx Restrictions Weight Bearing Restrictions: Yes LUE Weight Bearing: Non weight bearing LLE Weight Bearing: Touchdown weight bearing    Mobility  Bed Mobility Overal bed mobility: Needs Assistance Bed Mobility: Supine to Sit;Rolling Rolling: Mod assist   Supine to sit: Max assist;+2 for physical assistance     General bed mobility comments: Roll bilat for bed pan placement, pt declines OOB to BSC. Max +2  for trunk elevation, LE lowering over EOB, scooting to EOB with bed pads all via helicopter method.    Transfers Overall transfer level: Needs assistance Equipment used: 2 person hand held assist Transfers: Lateral/Scoot Transfers          Lateral/Scoot Transfers: Mod assist;+2 physical assistance;+2 safety/equipment General transfer comment: Mod +2 for sequential scooting to recliner for trunk elevation, promoting correct head/hips relationship, scoot towards R.  Ambulation/Gait                 Stairs             Wheelchair Mobility    Modified Rankin (Stroke Patients Only)       Balance Overall balance assessment: Needs assistance Sitting-balance support: Feet supported;Single extremity supported Sitting balance-Leahy Scale: Fair Sitting balance - Comments: able to sit EOB without PT support                                    Cognition Arousal/Alertness: Awake/alert Behavior During Therapy: WFL for tasks assessed/performed Overall Cognitive Status: Impaired/Different from baseline Area of Impairment: Memory;Attention;Following commands;Problem solving                   Current Attention Level: Sustained Memory: Decreased short-term memory Following Commands: Follows one step commands with increased time     Problem Solving: Slow processing;Difficulty sequencing;Requires verbal cues;Requires tactile cues General Comments: tearful due to L quad spasms, difficulty concentrating on mobility tasks at hand secondary to this      Exercises Other Exercises Other Exercises: gentle  STM quad to prevent spasm/treat trigger points; PT providing intermittent ischemic hold pressure to quad to treat spasms    General Comments        Pertinent Vitals/Pain Pain Assessment: Faces Faces Pain Scale: Hurts whole lot Pain Location: LLE, quad specifically Pain Descriptors / Indicators: Grimacing;Moaning;Spasm Pain Intervention(s): Limited  activity within patient's tolerance;Monitored during session;Repositioned;Other (comment) (RN gave medication during session for RLS, just received muscle relaxer)    Home Living                      Prior Function            PT Goals (current goals can now be found in the care plan section) Acute Rehab PT Goals Patient Stated Goal: get better, go home PT Goal Formulation: With patient Time For Goal Achievement: 03/15/21 Potential to Achieve Goals: Good Progress towards PT goals: Progressing toward goals    Frequency    Min 3X/week      PT Plan Current plan remains appropriate    Co-evaluation              AM-PAC PT "6 Clicks" Mobility   Outcome Measure  Help needed turning from your back to your side while in a flat bed without using bedrails?: A Lot Help needed moving from lying on your back to sitting on the side of a flat bed without using bedrails?: A Lot Help needed moving to and from a bed to a chair (including a wheelchair)?: A Lot Help needed standing up from a chair using your arms (e.g., wheelchair or bedside chair)?: A Lot Help needed to walk in hospital room?: Total Help needed climbing 3-5 steps with a railing? : Total 6 Click Score: 10    End of Session   Activity Tolerance: Patient limited by fatigue;Patient limited by pain Patient left: in chair;with chair alarm set;with call bell/phone within reach;with family/visitor present Nurse Communication: Mobility status;Need for lift equipment;Weight bearing status (stedy for back to bed) PT Visit Diagnosis: Muscle weakness (generalized) (M62.81);Pain Pain - Right/Left: Left Pain - part of body: Shoulder;Hip;Leg     Time: [id number] PT Time Calculation (min) (ACUTE ONLY): 39 min  Charges:  $Therapeutic Activity: 23-37 mins $Self Care/Home Management: 8-22                    Stacie Glaze, PT DPT Acute Rehabilitation Services Pager [phone]  Office [phone]    Louis Matte 03/06/2021,  3:43 PM

## 2021-03-06 NOTE — Plan of Care (Signed)

## 2021-03-06 NOTE — Plan of Care (Signed)

## 2021-03-07 DIAGNOSIS — S42202A Unspecified fracture of upper end of left humerus, initial encounter for closed fracture: Secondary | ICD-10-CM | POA: Diagnosis not present

## 2021-03-07 DIAGNOSIS — W19XXXA Unspecified fall, initial encounter: Secondary | ICD-10-CM | POA: Diagnosis not present

## 2021-03-07 DIAGNOSIS — H409 Unspecified glaucoma: Secondary | ICD-10-CM | POA: Diagnosis not present

## 2021-03-07 DIAGNOSIS — D649 Anemia, unspecified: Secondary | ICD-10-CM | POA: Diagnosis not present

## 2021-03-07 DIAGNOSIS — I10 Essential (primary) hypertension: Secondary | ICD-10-CM | POA: Diagnosis not present

## 2021-03-07 DIAGNOSIS — M79605 Pain in left leg: Secondary | ICD-10-CM | POA: Diagnosis not present

## 2021-03-07 DIAGNOSIS — L2089 Other atopic dermatitis: Secondary | ICD-10-CM | POA: Diagnosis not present

## 2021-03-07 DIAGNOSIS — F411 Generalized anxiety disorder: Secondary | ICD-10-CM | POA: Diagnosis not present

## 2021-03-07 DIAGNOSIS — L539 Erythematous condition, unspecified: Secondary | ICD-10-CM | POA: Diagnosis not present

## 2021-03-07 DIAGNOSIS — G2581 Restless legs syndrome: Secondary | ICD-10-CM | POA: Diagnosis not present

## 2021-03-07 DIAGNOSIS — E46 Unspecified protein-calorie malnutrition: Secondary | ICD-10-CM | POA: Diagnosis not present

## 2021-03-07 DIAGNOSIS — S32402A Unspecified fracture of left acetabulum, initial encounter for closed fracture: Secondary | ICD-10-CM | POA: Diagnosis not present

## 2021-03-07 DIAGNOSIS — I5032 Chronic diastolic (congestive) heart failure: Secondary | ICD-10-CM | POA: Diagnosis not present

## 2021-03-07 DIAGNOSIS — E860 Dehydration: Secondary | ICD-10-CM | POA: Diagnosis not present

## 2021-03-07 DIAGNOSIS — I739 Peripheral vascular disease, unspecified: Secondary | ICD-10-CM | POA: Diagnosis not present

## 2021-03-07 DIAGNOSIS — B372 Candidiasis of skin and nail: Secondary | ICD-10-CM | POA: Diagnosis not present

## 2021-03-07 DIAGNOSIS — N179 Acute kidney failure, unspecified: Secondary | ICD-10-CM | POA: Diagnosis not present

## 2021-03-07 DIAGNOSIS — S32455A Nondisplaced transverse fracture of left acetabulum, initial encounter for closed fracture: Secondary | ICD-10-CM | POA: Diagnosis not present

## 2021-03-07 DIAGNOSIS — I4821 Permanent atrial fibrillation: Secondary | ICD-10-CM | POA: Diagnosis not present

## 2021-03-07 DIAGNOSIS — E1142 Type 2 diabetes mellitus with diabetic polyneuropathy: Secondary | ICD-10-CM | POA: Diagnosis not present

## 2021-03-07 DIAGNOSIS — M79602 Pain in left arm: Secondary | ICD-10-CM | POA: Diagnosis not present

## 2021-03-07 DIAGNOSIS — K122 Cellulitis and abscess of mouth: Secondary | ICD-10-CM | POA: Diagnosis not present

## 2021-03-07 DIAGNOSIS — E1169 Type 2 diabetes mellitus with other specified complication: Secondary | ICD-10-CM | POA: Diagnosis not present

## 2021-03-07 DIAGNOSIS — Z743 Need for continuous supervision: Secondary | ICD-10-CM | POA: Diagnosis not present

## 2021-03-07 DIAGNOSIS — R531 Weakness: Secondary | ICD-10-CM | POA: Diagnosis not present

## 2021-03-07 DIAGNOSIS — S42202D Unspecified fracture of upper end of left humerus, subsequent encounter for fracture with routine healing: Secondary | ICD-10-CM | POA: Diagnosis not present

## 2021-03-07 DIAGNOSIS — M6281 Muscle weakness (generalized): Secondary | ICD-10-CM | POA: Diagnosis not present

## 2021-03-07 DIAGNOSIS — M25512 Pain in left shoulder: Secondary | ICD-10-CM | POA: Diagnosis not present

## 2021-03-07 DIAGNOSIS — S40022D Contusion of left upper arm, subsequent encounter: Secondary | ICD-10-CM | POA: Diagnosis not present

## 2021-03-07 DIAGNOSIS — E869 Volume depletion, unspecified: Secondary | ICD-10-CM | POA: Diagnosis not present

## 2021-03-07 DIAGNOSIS — J449 Chronic obstructive pulmonary disease, unspecified: Secondary | ICD-10-CM | POA: Diagnosis not present

## 2021-03-07 DIAGNOSIS — I951 Orthostatic hypotension: Secondary | ICD-10-CM | POA: Diagnosis not present

## 2021-03-07 DIAGNOSIS — M5459 Other low back pain: Secondary | ICD-10-CM | POA: Diagnosis not present

## 2021-03-07 DIAGNOSIS — R42 Dizziness and giddiness: Secondary | ICD-10-CM | POA: Diagnosis not present

## 2021-03-07 DIAGNOSIS — F410 Panic disorder [episodic paroxysmal anxiety] without agoraphobia: Secondary | ICD-10-CM | POA: Diagnosis not present

## 2021-03-07 DIAGNOSIS — K5900 Constipation, unspecified: Secondary | ICD-10-CM | POA: Diagnosis not present

## 2021-03-07 DIAGNOSIS — G4700 Insomnia, unspecified: Secondary | ICD-10-CM | POA: Diagnosis not present

## 2021-03-07 DIAGNOSIS — R262 Difficulty in walking, not elsewhere classified: Secondary | ICD-10-CM | POA: Diagnosis not present

## 2021-03-07 DIAGNOSIS — E1165 Type 2 diabetes mellitus with hyperglycemia: Secondary | ICD-10-CM | POA: Diagnosis not present

## 2021-03-07 DIAGNOSIS — S42302D Unspecified fracture of shaft of humerus, left arm, subsequent encounter for fracture with routine healing: Secondary | ICD-10-CM | POA: Diagnosis not present

## 2021-03-07 DIAGNOSIS — E1159 Type 2 diabetes mellitus with other circulatory complications: Secondary | ICD-10-CM | POA: Diagnosis not present

## 2021-03-07 DIAGNOSIS — F339 Major depressive disorder, recurrent, unspecified: Secondary | ICD-10-CM | POA: Diagnosis not present

## 2021-03-07 DIAGNOSIS — M5416 Radiculopathy, lumbar region: Secondary | ICD-10-CM | POA: Diagnosis not present

## 2021-03-07 DIAGNOSIS — S42292A Other displaced fracture of upper end of left humerus, initial encounter for closed fracture: Secondary | ICD-10-CM | POA: Diagnosis not present

## 2021-03-07 DIAGNOSIS — I480 Paroxysmal atrial fibrillation: Secondary | ICD-10-CM | POA: Diagnosis not present

## 2021-03-07 DIAGNOSIS — E871 Hypo-osmolality and hyponatremia: Secondary | ICD-10-CM | POA: Diagnosis not present

## 2021-03-07 DIAGNOSIS — L304 Erythema intertrigo: Secondary | ICD-10-CM | POA: Diagnosis not present

## 2021-03-07 DIAGNOSIS — S32402D Unspecified fracture of left acetabulum, subsequent encounter for fracture with routine healing: Secondary | ICD-10-CM | POA: Diagnosis not present

## 2021-03-07 DIAGNOSIS — F419 Anxiety disorder, unspecified: Secondary | ICD-10-CM | POA: Diagnosis not present

## 2021-03-07 LAB — RESP PANEL BY RT-PCR (FLU A&B, COVID) ARPGX2
Influenza A by PCR: NEGATIVE
Influenza B by PCR: NEGATIVE
SARS Coronavirus 2 by RT PCR: NEGATIVE

## 2021-03-07 LAB — GLUCOSE, CAPILLARY
Glucose-Capillary: 161 mg/dL — ABNORMAL HIGH (ref 70–99)
Glucose-Capillary: 204 mg/dL — ABNORMAL HIGH (ref 70–99)
Glucose-Capillary: 151 mg/dL — ABNORMAL HIGH (ref 70–99)
Glucose-Capillary: 139 mg/dL — ABNORMAL HIGH (ref 70–99)

## 2021-03-07 LAB — CBC
HCT: 25 % — ABNORMAL LOW (ref 36.0–46.0)
Hemoglobin: 8.3 g/dL — ABNORMAL LOW (ref 12.0–15.0)
MCH: 29.7 pg (ref 26.0–34.0)
MCHC: 33.2 g/dL (ref 30.0–36.0)
MCV: 89.6 fL (ref 80.0–100.0)
Platelets: 234 10*3/uL (ref 150–400)
RBC: 2.79 MIL/uL — ABNORMAL LOW (ref 3.87–5.11)
RDW: 13.3 % (ref 11.5–15.5)
WBC: 8.2 10*3/uL (ref 4.0–10.5)
nRBC: 0 % (ref 0.0–0.2)

## 2021-03-07 LAB — BASIC METABOLIC PANEL
Anion gap: 9 (ref 5–15)
BUN: 10 mg/dL (ref 8–23)
CO2: 22 mmol/L (ref 22–32)
Calcium: 8.7 mg/dL — ABNORMAL LOW (ref 8.9–10.3)
Chloride: 98 mmol/L (ref 98–111)
Creatinine, Ser: 0.7 mg/dL (ref 0.44–1.00)
GFR, Estimated: >60 mL/min (ref >60)
Glucose, Bld: 164 mg/dL — ABNORMAL HIGH (ref 70–99)
Potassium: 3.6 mmol/L (ref 3.5–5.1)
Sodium: 129 mmol/L — ABNORMAL LOW (ref 135–145)

## 2021-03-07 MED ORDER — LORAZEPAM 0.5 MG PO TABS: 0.5000 mg | ORAL_TABLET | Freq: Two times a day (BID) | ORAL | 0 refills | Status: DC | PRN

## 2021-03-07 MED ORDER — TORSEMIDE 20 MG PO TABS: 40.0000 mg | ORAL_TABLET | Freq: Two times a day (BID) | ORAL | Status: DC

## 2021-03-07 MED ORDER — OXYCODONE HCL 5 MG PO TABS: 5.0000 mg | ORAL_TABLET | ORAL | 0 refills | Status: DC | PRN

## 2021-03-07 MED ORDER — AMOXICILLIN 500 MG PO CAPS: 500.0000 mg | ORAL_CAPSULE | Freq: Three times a day (TID) | ORAL | 0 refills | Status: AC

## 2021-03-07 MED ORDER — COVID-19 MRNA VAC-TRIS(PFIZER) 30 MCG/0.3ML IM SUSP
0.3000 mL | Freq: Once | INTRAMUSCULAR | Status: AC
  Administered 2021-03-07: 0.3 mL via INTRAMUSCULAR
  Filled 2021-03-07: qty 0.3

## 2021-03-07 NOTE — Progress Notes (Signed)
Report called to [facility] and given to [facility].

## 2021-03-07 NOTE — TOC Progression Note (Addendum)
Transition of Care [facility]) - Progression Note    Patient Details  Name: [name] MRN: [id number] Date of Birth: 03/26/1951  Transition of Care [facility]) CM/SW Contact  Sharin Mons, RN Phone Number: 03/07/2021, 9:47 AM  Clinical Narrative:    Pt accepted SNF  bed offer with [facility], wife to confirm with tour @ 12n. Insurance authorization pending with [employer].  TOC team will continue to monitor and assist with TOC needs.   03/07/2021 @ 9:58 am [place] / [employer]  called and approval for SNF placement received, ref. # [id number] x 7 days ,  ref. # [id number], PTAR.  Expected Discharge Plan: Skilled Nursing Facility Barriers to Discharge: Ship broker, Continued Medical Work up  Expected Discharge Plan and Services Expected Discharge Plan: [place]                                               Social Determinants of Health (SDOH) Interventions    Readmission Risk Interventions No flowsheet data found.

## 2021-03-07 NOTE — Progress Notes (Signed)
Physical Therapy Treatment Patient Details Name: [name] MRN: [id number] DOB: 26-Oct-1950 Today's Date: 03/07/2021    History of Present Illness [name] is a 70 y.o. female who presented to [facility] ED from home after an unwitnessed fall out of bed. Pt presents with minimally displaced fracture involving the surgical neck of the proximal left humerus, and Acute nondisplaced fracture involving posterior wall of the acetabulum with extension of lucency to the articular surface (non-surgical management for both). Medical history significant for chronic hyponatremia, permanent A. fib on Eliquis, chronic diastolic CHF, COPD, type 2 diabetes, GERD, hypertension, hyperlipidemia, spinal stenosis    PT Comments    Pt resting upon PT arrival to room, eager to get OOB and work on standing activity. Pt tolerated repeated sit<>stands from both low surfaces and stedy height this session, PT emphasizing TDWB LLE during transfers. Pt also tolerated LE exercises well. Overall, pt requiring mod assist for mobility at this time. Will continue to follow.    Follow Up Recommendations  Supervision for mobility/OOB;SNF     Equipment Recommendations  None recommended by PT    Recommendations for Other Services       Precautions / Restrictions Precautions Precautions: Fall Precaution Comments: posterior hip precautions reviewed given acetabular fx Required Braces or Orthoses: Sling Restrictions Weight Bearing Restrictions: Yes LUE Weight Bearing: Non weight bearing LLE Weight Bearing: Touchdown weight bearing    Mobility  Bed Mobility Overal bed mobility: Needs Assistance Bed Mobility: Supine to Sit     Supine to sit: Mod assist;+2 for physical assistance     General bed mobility comments: mod +2 for trunk and LE management, scooting to EOB.    Transfers Overall transfer level: Needs assistance Equipment used: Ambulation equipment used Transfers: Sit to/from Stand Sit to Stand: Mod  assist;+2 physical assistance         General transfer comment: Mod +2 for power up, rise, reinforcing TDWB LLE, and slow eccentric lower. STS from bed x1, BSC x1, and from stedy x2.  Ambulation/Gait                 Stairs             Wheelchair Mobility    Modified Rankin (Stroke Patients Only)       Balance Overall balance assessment: Needs assistance;History of Falls Sitting-balance support: Feet supported;Single extremity supported Sitting balance-Leahy Scale: Fair       Standing balance-Leahy Scale: Poor Standing balance comment: reliant on external assist and physical support                            Cognition Arousal/Alertness: Awake/alert Behavior During Therapy: WFL for tasks assessed/performed Overall Cognitive Status: Within Functional Limits for tasks assessed Area of Impairment: Following commands                       Following Commands: Follows one step commands consistently     Problem Solving: Slow processing;Difficulty sequencing;Requires verbal cues;Requires tactile cues General Comments: can be tangential      Exercises General Exercises - Lower Extremity Heel Slides: AAROM;Left;10 reps;Supine (in tolerable ROM, maintaining hip precautions) Hip ABduction/ADduction: AAROM;Left;10 reps;Supine (in tolerable ROM, maintaining hip precautions)    General Comments        Pertinent Vitals/Pain Pain Assessment: Faces Faces Pain Scale: Hurts even more Pain Location: LLE and LUE Pain Descriptors / Indicators: Grimacing;Moaning;Sore Pain Intervention(s): Limited activity within patient's tolerance;Monitored  during session;Repositioned    Home Living                      Prior Function            PT Goals (current goals can now be found in the care plan section) Acute Rehab PT Goals Patient Stated Goal: get better, go home PT Goal Formulation: With patient Time For Goal Achievement:  03/15/21 Potential to Achieve Goals: Good Progress towards PT goals: Progressing toward goals    Frequency    Min 3X/week      PT Plan Current plan remains appropriate    Co-evaluation              AM-PAC PT "6 Clicks" Mobility   Outcome Measure  Help needed turning from your back to your side while in a flat bed without using bedrails?: A Lot Help needed moving from lying on your back to sitting on the side of a flat bed without using bedrails?: A Lot Help needed moving to and from a bed to a chair (including a wheelchair)?: A Lot Help needed standing up from a chair using your arms (e.g., wheelchair or bedside chair)?: A Lot Help needed to walk in hospital room?: Total Help needed climbing 3-5 steps with a railing? : Total 6 Click Score: 10    End of Session Equipment Utilized During Treatment: Gait belt Activity Tolerance: Patient limited by fatigue Patient left: in chair;with chair alarm set;with call bell/phone within reach;with family/visitor present Nurse Communication: Mobility status;Need for lift equipment;Weight bearing status (stedy for back to bed) PT Visit Diagnosis: Muscle weakness (generalized) (M62.81);Pain Pain - Right/Left: Left Pain - part of body: Shoulder;Hip;Leg     Time: [id number] PT Time Calculation (min) (ACUTE ONLY): 32 min  Charges:  $Therapeutic Exercise: 8-22 mins $Therapeutic Activity: 8-22 mins                     Stacie Glaze, PT DPT Acute Rehabilitation Services Pager [phone]  Office [phone]    [place] Ruffin Pyo 03/07/2021, 1:02 PM

## 2021-03-07 NOTE — TOC Transition Note (Signed)
Transition of Care [facility]) - CM/SW Discharge Note   Patient Details  Name: [name] MRN: [id number] Date of Birth: [birth year]  Transition of Care [facility]) CM/SW Contact:  Sharin Mons, RN Phone Number: 03/07/2021, 2:10 PM   Clinical Narrative:    Patient will DC to: [facility] Anticipated DC date: 03/07/2021 Family notified: YES Transport by: Corey Harold   Per MD patient ready for DC today. RN, patient, patient's family, and facility notified of DC. Discharge Summary and FL2 sent to facility. RN to call report prior to discharge [phone]). DC packet on chart. Ambulance transport requested for patient.   RNCM will sign off for now as intervention is no longer needed. Please consult [place] again if new needs arise.   Final next level of care: Skilled Nursing Facility Barriers to Discharge: No Barriers Identified   Patient Goals and CMS Choice   CMS Medicare.gov Compare Post Acute Care list provided to:: Patient    Discharge Placement                       Discharge Plan and Services                                     Social Determinants of Health (SDOH) Interventions     Readmission Risk Interventions No flowsheet data found.

## 2021-03-07 NOTE — Progress Notes (Signed)
Discharge orders received. Printed AVS forms and placed in d/c packet. Printed and signed scripts in facility packet. Updated patient and wife at bedside about d/c. Verbalized understanding and awaiting PTAR at this time. Patient has all personal belongings at bedside.

## 2021-03-07 NOTE — Discharge Summary (Signed)
Physician Discharge Summary   CIEARA FISK [id number] DOB: 1951-02-19 DOA: 02/28/2021  PCP: Hoyt Koch, MD  Admit date: 02/28/2021 Discharge date: 03/07/2021  Admitted From: home Disposition:  SNF Discharging physician: Dwyane Dee, MD  Recommendations for Outpatient Follow-up:  Resume torsemide on 7/29 7 day course amoxicillin prescribed for possible dental infection Follow-up with orthopedic surgery for repeat x-rays within 1 week Xarelto initally held but resumed at discharge given expected ongoing immobility and need for anticoagulation for chronic Afib. Check Hgb in 2-3 days and monitor LUE bruising and/or for any swelling, if present may need to stop again   Home Health:  Equipment/Devices:   Patient discharged to SNF in Discharge Condition: stable Risk of unplanned readmission score: Unplanned Admission- Pilot do not use: 15.88  CODE STATUS: Full Diet recommendation:  Diet Orders (From admission, onward)     Start     Ordered   03/07/21 0000  Diet Carb Modified        03/07/21 1412   03/04/21 1100  Diet Carb Modified Fluid consistency: Thin; Room service appropriate? Yes; Fluid restriction: 1800 mL Fluid  Diet effective now       Question Answer Comment  Diet-HS Snack? Nothing   Calorie Level Medium 1600-2000   Fluid consistency: Thin   Room service appropriate? Yes   Fluid restriction: 1800 mL Fluid      03/04/21 1101            Hospital Course:  70 year old female with a history of atrial fibrillation on anticoagulation, diastolic CHF, COPD, diabetes, presented to the emergency room after having an unwitnessed fall.  She was found to have a left humerus fracture as well as left acetabular fracture.  She was seen by orthopedics with recommendations for nonoperative management.  She was also noted to be hyponatremic which resolved with IV fluids.  She was followed by nephrology for this as well and is recommended to continue holding torsemide at  discharge and resume on 03/10/2021.  Plan is for skilled nursing facility placement Orthopedic surgery recommendations are for nonweightbearing to the left upper extremity and touchdown weightbearing of the left lower extremity.  Recommendations are to continue for 6 weeks.  She will need outpatient follow-up with orthopedic surgery in 7 to 10 days for repeat x-rays.  Proximal left humerus fracture, Left acetabular fracture -seen by ortho, who also discussed case with trauma specialist -Recommendations are for nonoperative management at this point -She is to be nonweightbearing left upper extremity and touchdown weightbearing of the left lower extremity -Outpatient follow-up with orthopedic surgery in 7 to 10 days for repeat x-rays -Continue pain management   Permanent A fib -HR stable on toprol and diltiazem -she was anticoagulated with xarelto, but this is being held due to anemia and concerns for bleeding. - given prolonged immobility expected, will resume xarelto but will need repeat Hgb in 2-3 days and discontinuation again if bruising in LUE worsens   Anemia -chronic -Hemoglobin has trended down since admission from 11.2 to 8.1 -no evidence of GI bleeding -may have a component of hemodilution with Iv fluids -may also have a component of acute blood loss with extensive bruising/hematoma noted in left arm -continue to monitor   Left arm hematoma -related to recent injury in the setting of anticoagulation -will apply ice to left arm PRN -currently hemoglobin stable ~8s   Hyponatremia -chronic issue dating back to 2020 -Serum sodium of 126 on admission - hypovolemic initially and responded well to IVF -  repeat BMP in 1-2 weeks - encourage good oral intake    DM2 -A1c 7.1 -chronically on jamumet and invokana -blood sugars stable   Chronic anxiety/depresison -continue on paxil   Restless leg syndrome -continue on requip -Follow-up with neurology as an outpatient    HLD -continue on statin   Chronic diastolic chf -currently appears compensated -Torsemide on hold   COPD -no wheezing or shortness of breath at this time -currently on RA -continue on Anoro -Follow-up with pulmonology as an outpatient   AKI - resolved  -creatinine up to 1.5 -held diuretics and gave gentle hydration -creatinine down to 0.98   Constipation -continue on daily miralax -Resolved after receiving enema   Dental infection -Swelling noted around right upper teeth/gums -We will start a short course of amoxicillin  Principal Diagnosis: Left acetabular fracture [facility])  Discharge Diagnoses: Active Hospital Problems   Diagnosis Date Noted   Left acetabular fracture ([place]) 03/03/2021   COPD (chronic obstructive pulmonary disease) ([place]) 03/03/2021   Closed fracture of left proximal humerus 03/03/2021   Chronic diastolic CHF (congestive heart failure) ([place]) 03/03/2021   Hyponatremia 03/03/2021   AKI (acute kidney injury) ([place]) 03/03/2021   Permanent atrial fibrillation ([place]) 03/03/2021   Restless leg syndrome 03/03/2021   DM (diabetes mellitus), type 2 ([place]) 03/03/2021   Fall 02/28/2021    Resolved Hospital Problems  No resolved problems to display.    Discharge Instructions     Diet Carb Modified   Complete by: As directed    Increase activity slowly   Complete by: As directed       Allergies as of 03/07/2021       Reactions   Benadryl [diphenhydramine] Anxiety        Medication List     STOP taking these medications    HYDROcodone-acetaminophen 5-325 MG tablet Commonly known as: NORCO/VICODIN       TAKE these medications    amoxicillin 500 MG capsule Commonly known as: AMOXIL Take 1 capsule (500 mg total) by mouth every 8 (eight) hours for 5 days.   Anoro Ellipta 62.5-25 MCG/INH Aepb Generic drug: umeclidinium-vilanterol Inhale 1 puff into the lungs daily.   celecoxib 200 MG capsule Commonly known as: CELEBREX Take 200 mg by mouth  2 (two) times daily.   diltiazem 180 MG 24 hr capsule Commonly known as: CARDIZEM CD Take 180 mg by mouth daily.   famotidine 20 MG tablet Commonly known as: PEPCID Take 20 mg by mouth 2 (two) times daily.   guaiFENesin 600 MG 12 hr tablet Commonly known as: MUCINEX Take 600 mg by mouth 2 (two) times daily.   Invokana 100 MG Tabs tablet Generic drug: canagliflozin Take 100 mg by mouth every morning.   Janumet 50-1000 MG tablet Generic drug: sitaGLIPtin-metformin Take 1 tablet by mouth 2 (two) times daily.   latanoprost 0.005 % ophthalmic solution Commonly known as: XALATAN Place 1 drop into both eyes at bedtime.   LORazepam 0.5 MG tablet Commonly known as: ATIVAN Take 1 tablet (0.5 mg total) by mouth 2 (two) times daily as needed for anxiety. What changed:  how much to take when to take this reasons to take this   magnesium oxide 400 MG tablet Commonly known as: MAG-OX Take 400 mg by mouth daily.   metoprolol succinate 100 MG 24 hr tablet Commonly known as: TOPROL-XL Take 100 mg by mouth 2 (two) times daily.   metoprolol tartrate 25 MG tablet Commonly known as: LOPRESSOR Take 25 mg by mouth  daily as needed (if HR increases).   multivitamin capsule Take 1 capsule by mouth daily.   nystatin ointment Commonly known as: MYCOSTATIN Apply 1 application topically 2 (two) times daily as needed (rash).   oxyCODONE 5 MG immediate release tablet Commonly known as: Oxy IR/ROXICODONE Take 1 tablet (5 mg total) by mouth every 4 (four) hours as needed for breakthrough pain or severe pain.   PARoxetine 10 MG tablet Commonly known as: PAXIL Take 25 mg by mouth daily.   potassium chloride 10 MEQ CR capsule Commonly known as: MICRO-K Take 10 mEq by mouth 2 (two) times daily.   quinapril 40 MG tablet Commonly known as: ACCUPRIL Take 40 mg by mouth at bedtime.   rOPINIRole 1 MG tablet Commonly known as: REQUIP Take 1 mg by mouth 3 (three) times daily as needed.  Restless leg  1400, 2200   rosuvastatin 10 MG tablet Commonly known as: CRESTOR Take 10 mg by mouth at bedtime.   torsemide 20 MG tablet Commonly known as: DEMADEX Take 2 tablets (40 mg total) by mouth 2 (two) times daily. Start taking on: March 10, 2021 What changed: These instructions start on March 10, 2021. If you are unsure what to do until then, ask your doctor or other care provider.   traZODone 100 MG tablet Commonly known as: DESYREL Take 50-100 mg by mouth at bedtime.   vitamin C 1000 MG tablet Take 1,000 mg by mouth daily.   Xarelto 20 MG Tabs tablet Generic drug: rivaroxaban Take 20 mg by mouth daily.        Allergies  Allergen Reactions   Benadryl [Diphenhydramine] Anxiety    Consultations: Orthopedic surgery Nephrology  Discharge Exam: BP (!) 147/69 (BP Location: Right Arm)   Pulse 91   Temp 99 F (37.2 C) (Oral)   Resp 16   Ht '5\' 2"'$  (1.575 m)   Wt 81.6 kg   SpO2 95%   BMI 32.92 kg/m  General appearance: alert, cooperative, and no distress Head: Normocephalic, without obvious abnormality, atraumatic Eyes:  EOMI Lungs: clear to auscultation bilaterally Heart: regular rate and rhythm and S1, S2 normal Abdomen: normal findings: bowel sounds normal and soft, non-tender Extremities:  Left upper extremity noted in sling with bruising on left arm. Skin: mobility and turgor normal Neurologic: Left upper and lower extremity limited by pain otherwise no focal deficits  The results of significant diagnostics from this hospitalization (including imaging, microbiology, ancillary and laboratory) are listed below for reference.   Microbiology: Recent Results (from the past 240 hour(s))  Resp Panel by RT-PCR (Flu A&B, Covid) Nasopharyngeal Swab     Status: None   Collection Time: 02/28/21  7:06 PM   Specimen: Nasopharyngeal Swab; Nasopharyngeal(NP) swabs in vial transport medium  Result Value Ref Range Status   SARS Coronavirus 2 by RT PCR NEGATIVE NEGATIVE  Final    Comment: (NOTE) SARS-CoV-2 target nucleic acids are NOT DETECTED.  The SARS-CoV-2 RNA is generally detectable in upper respiratory specimens during the acute phase of infection. The lowest concentration of SARS-CoV-2 viral copies this assay can detect is 138 copies/mL. A negative result does not preclude SARS-Cov-2 infection and should not be used as the sole basis for treatment or other patient management decisions. A negative result may occur with  improper specimen collection/handling, submission of specimen other than nasopharyngeal swab, presence of viral mutation(s) within the areas targeted by this assay, and inadequate number of viral copies(<138 copies/mL). A negative result must be combined with clinical observations,  patient history, and epidemiological information. The expected result is Negative.  Fact Sheet for Patients:  EntrepreneurPulse.com.au  Fact Sheet for Healthcare Providers:  IncredibleEmployment.be  This test is no t yet approved or cleared by the [place] FDA and  has been authorized for detection and/or diagnosis of SARS-CoV-2 by FDA under an Emergency Use Authorization (EUA). This EUA will remain  in effect (meaning this test can be used) for the duration of the COVID-19 declaration under Section 564(b)(1) of the Act, 21 U.S.C.section 360bbb-3(b)(1), unless the authorization is terminated  or revoked sooner.       Influenza A by PCR NEGATIVE NEGATIVE Final   Influenza B by PCR NEGATIVE NEGATIVE Final    Comment: (NOTE) The Xpert Xpress SARS-CoV-2/FLU/RSV plus assay is intended as an aid in the diagnosis of influenza from Nasopharyngeal swab specimens and should not be used as a sole basis for treatment. Nasal washings and aspirates are unacceptable for Xpert Xpress SARS-CoV-2/FLU/RSV testing.  Fact Sheet for Patients: EntrepreneurPulse.com.au  Fact Sheet for Healthcare  Providers: IncredibleEmployment.be  This test is not yet approved or cleared by the [place] FDA and has been authorized for detection and/or diagnosis of SARS-CoV-2 by FDA under an Emergency Use Authorization (EUA). This EUA will remain in effect (meaning this test can be used) for the duration of the COVID-19 declaration under Section 564(b)(1) of the Act, 21 U.S.C. section 360bbb-3(b)(1), unless the authorization is terminated or revoked.  Performed at [facility], [place] [address] [place], [place] [address]   Resp Panel by RT-PCR (Flu A&B, Covid) Nasopharyngeal Swab     Status: None   Collection Time: 03/07/21 12:17 PM   Specimen: Nasopharyngeal Swab; Nasopharyngeal(NP) swabs in vial transport medium  Result Value Ref Range Status   SARS Coronavirus 2 by RT PCR NEGATIVE NEGATIVE Final    Comment: (NOTE) SARS-CoV-2 target nucleic acids are NOT DETECTED.  The SARS-CoV-2 RNA is generally detectable in upper respiratory specimens during the acute phase of infection. The lowest concentration of SARS-CoV-2 viral copies this assay can detect is 138 copies/mL. A negative result does not preclude SARS-Cov-2 infection and should not be used as the sole basis for treatment or other patient management decisions. A negative result may occur with  improper specimen collection/handling, submission of specimen other than nasopharyngeal swab, presence of viral mutation(s) within the areas targeted by this assay, and inadequate number of viral copies(<138 copies/mL). A negative result must be combined with clinical observations, patient history, and epidemiological information. The expected result is Negative.  Fact Sheet for Patients:  EntrepreneurPulse.com.au  Fact Sheet for Healthcare Providers:  IncredibleEmployment.be  This test is no t yet approved or cleared by the [place] FDA and  has been authorized for  detection and/or diagnosis of SARS-CoV-2 by FDA under an Emergency Use Authorization (EUA). This EUA will remain  in effect (meaning this test can be used) for the duration of the COVID-19 declaration under Section 564(b)(1) of the Act, 21 U.S.C.section 360bbb-3(b)(1), unless the authorization is terminated  or revoked sooner.       Influenza A by PCR NEGATIVE NEGATIVE Final   Influenza B by PCR NEGATIVE NEGATIVE Final    Comment: (NOTE) The Xpert Xpress SARS-CoV-2/FLU/RSV plus assay is intended as an aid in the diagnosis of influenza from Nasopharyngeal swab specimens and should not be used as a sole basis for treatment. Nasal washings and aspirates are unacceptable for Xpert Xpress SARS-CoV-2/FLU/RSV testing.  Fact Sheet for Patients: EntrepreneurPulse.com.au  Fact  Sheet for Healthcare Providers: IncredibleEmployment.be  This test is not yet approved or cleared by the [place] and has been authorized for detection and/or diagnosis of SARS-CoV-2 by FDA under an Emergency Use Authorization (EUA). This EUA will remain in effect (meaning this test can be used) for the duration of the COVID-19 declaration under Section 564(b)(1) of the Act, 21 U.S.C. section 360bbb-3(b)(1), unless the authorization is terminated or revoked.  Performed at [facility], [place] [address], [place], [place] [address]      Labs: BNP (last 3 results) No results for input(s): BNP in the last 8760 hours. Basic Metabolic Panel: Recent Labs  Lab 03/01/21 0018 03/02/21 0228 03/04/21 0309 03/05/21 0207 03/06/21 0152 03/07/21 0337  NA 128* 130* 125* 123* 126* 129*  K 5.2* 4.5 4.3 4.1 3.9 3.6  CL 96* 97* 94* 91* 93* 98  CO2 '24 24 23 '$ 20* 22 22  GLUCOSE 160* 153* 162* 168* 235* 164*  BUN '23 13 14 18 15 10  '$ CREATININE 1.50* 1.01* 0.98 0.93 0.91 0.70  CALCIUM 8.8* 9.0 8.8* 8.4* 8.5* 8.7*  MG 1.7  --   --   --   --   --   PHOS 4.5  --   --  3.3  --    --    Liver Function Tests: Recent Labs  Lab 03/05/21 0207  ALBUMIN 3.1*   No results for input(s): LIPASE, AMYLASE in the last 168 hours. No results for input(s): AMMONIA in the last 168 hours. CBC: Recent Labs  Lab 03/02/21 0228 03/04/21 0309 03/05/21 0207 03/06/21 0152 03/07/21 0337  WBC 11.4* 9.5 8.1 8.9 8.2  HGB 9.6* 8.1* 8.1* 8.7* 8.3*  HCT 27.1* 23.9* 23.4* 24.7* 25.0*  MCV 85.5 87.2 87.3 85.5 89.6  PLT 166 177 188 203 234   Cardiac Enzymes: No results for input(s): CKTOTAL, CKMB, CKMBINDEX, TROPONINI in the last 168 hours. BNP: Invalid input(s): POCBNP CBG: Recent Labs  Lab 03/06/21 1155 03/06/21 1545 03/06/21 2022 03/07/21 0626 03/07/21 1130  GLUCAP 250* 208* 183* 161* 204*   D-Dimer No results for input(s): DDIMER in the last 72 hours. Hgb A1c No results for input(s): HGBA1C in the last 72 hours. Lipid Profile No results for input(s): CHOL, HDL, LDLCALC, TRIG, CHOLHDL, LDLDIRECT in the last 72 hours. Thyroid function studies No results for input(s): TSH, T4TOTAL, T3FREE, THYROIDAB in the last 72 hours.  Invalid input(s): FREET3 Anemia work up Recent Labs    03/05/21 0207  FERRITIN 51  TIBC 386  IRON 46   Urinalysis    Component Value Date/Time   COLORURINE YELLOW 03/01/2021 [place] (A) 03/01/2021 0713   LABSPEC 1.007 03/01/2021 0713   PHURINE 5.0 03/01/2021 0713   GLUCOSEU >=500 (A) 03/01/2021 0713   HGBUR NEGATIVE 03/01/2021 0713   BILIRUBINUR NEGATIVE 03/01/2021 0713   KETONESUR NEGATIVE 03/01/2021 0713   PROTEINUR NEGATIVE 03/01/2021 0713   NITRITE NEGATIVE 03/01/2021 0713   LEUKOCYTESUR LARGE (A) 03/01/2021 0713   Sepsis Labs Invalid input(s): PROCALCITONIN,  WBC,  LACTICIDVEN Microbiology Recent Results (from the past 240 hour(s))  Resp Panel by RT-PCR (Flu A&B, Covid) Nasopharyngeal Swab     Status: None   Collection Time: 02/28/21  7:06 PM   Specimen: Nasopharyngeal Swab; Nasopharyngeal(NP) swabs in vial  transport medium  Result Value Ref Range Status   SARS Coronavirus 2 by RT PCR NEGATIVE NEGATIVE Final    Comment: (NOTE) SARS-CoV-2 target nucleic acids are NOT DETECTED.  The  SARS-CoV-2 RNA is generally detectable in upper respiratory specimens during the acute phase of infection. The lowest concentration of SARS-CoV-2 viral copies this assay can detect is 138 copies/mL. A negative result does not preclude SARS-Cov-2 infection and should not be used as the sole basis for treatment or other patient management decisions. A negative result may occur with  improper specimen collection/handling, submission of specimen other than nasopharyngeal swab, presence of viral mutation(s) within the areas targeted by this assay, and inadequate number of viral copies(<138 copies/mL). A negative result must be combined with clinical observations, patient history, and epidemiological information. The expected result is Negative.  Fact Sheet for Patients:  EntrepreneurPulse.com.au  Fact Sheet for Healthcare Providers:  IncredibleEmployment.be  This test is no t yet approved or cleared by the [place] FDA and  has been authorized for detection and/or diagnosis of SARS-CoV-2 by FDA under an Emergency Use Authorization (EUA). This EUA will remain  in effect (meaning this test can be used) for the duration of the COVID-19 declaration under Section 564(b)(1) of the Act, 21 U.S.C.section 360bbb-3(b)(1), unless the authorization is terminated  or revoked sooner.       Influenza A by PCR NEGATIVE NEGATIVE Final   Influenza B by PCR NEGATIVE NEGATIVE Final    Comment: (NOTE) The Xpert Xpress SARS-CoV-2/FLU/RSV plus assay is intended as an aid in the diagnosis of influenza from Nasopharyngeal swab specimens and should not be used as a sole basis for treatment. Nasal washings and aspirates are unacceptable for Xpert Xpress SARS-CoV-2/FLU/RSV testing.  Fact  Sheet for Patients: EntrepreneurPulse.com.au  Fact Sheet for Healthcare Providers: IncredibleEmployment.be  This test is not yet approved or cleared by the [place] FDA and has been authorized for detection and/or diagnosis of SARS-CoV-2 by FDA under an Emergency Use Authorization (EUA). This EUA will remain in effect (meaning this test can be used) for the duration of the COVID-19 declaration under Section 564(b)(1) of the Act, 21 U.S.C. section 360bbb-3(b)(1), unless the authorization is terminated or revoked.  Performed at [facility], [place] [address], [place], [place] [address]   Resp Panel by RT-PCR (Flu A&B, Covid) Nasopharyngeal Swab     Status: None   Collection Time: 03/07/21 12:17 PM   Specimen: Nasopharyngeal Swab; Nasopharyngeal(NP) swabs in vial transport medium  Result Value Ref Range Status   SARS Coronavirus 2 by RT PCR NEGATIVE NEGATIVE Final    Comment: (NOTE) SARS-CoV-2 target nucleic acids are NOT DETECTED.  The SARS-CoV-2 RNA is generally detectable in upper respiratory specimens during the acute phase of infection. The lowest concentration of SARS-CoV-2 viral copies this assay can detect is 138 copies/mL. A negative result does not preclude SARS-Cov-2 infection and should not be used as the sole basis for treatment or other patient management decisions. A negative result may occur with  improper specimen collection/handling, submission of specimen other than nasopharyngeal swab, presence of viral mutation(s) within the areas targeted by this assay, and inadequate number of viral copies(<138 copies/mL). A negative result must be combined with clinical observations, patient history, and epidemiological information. The expected result is Negative.  Fact Sheet for Patients:  EntrepreneurPulse.com.au  Fact Sheet for Healthcare Providers:  IncredibleEmployment.be  This test is  no t yet approved or cleared by the [place] FDA and  has been authorized for detection and/or diagnosis of SARS-CoV-2 by FDA under an Emergency Use Authorization (EUA). This EUA will remain  in effect (meaning this test can be used) for the duration of the  COVID-19 declaration under Section 564(b)(1) of the Act, 21 U.S.C.section 360bbb-3(b)(1), unless the authorization is terminated  or revoked sooner.       Influenza A by PCR NEGATIVE NEGATIVE Final   Influenza B by PCR NEGATIVE NEGATIVE Final    Comment: (NOTE) The Xpert Xpress SARS-CoV-2/FLU/RSV plus assay is intended as an aid in the diagnosis of influenza from Nasopharyngeal swab specimens and should not be used as a sole basis for treatment. Nasal washings and aspirates are unacceptable for Xpert Xpress SARS-CoV-2/FLU/RSV testing.  Fact Sheet for Patients: EntrepreneurPulse.com.au  Fact Sheet for Healthcare Providers: IncredibleEmployment.be  This test is not yet approved or cleared by the [place] FDA and has been authorized for detection and/or diagnosis of SARS-CoV-2 by FDA under an Emergency Use Authorization (EUA). This EUA will remain in effect (meaning this test can be used) for the duration of the COVID-19 declaration under Section 564(b)(1) of the Act, 21 U.S.C. section 360bbb-3(b)(1), unless the authorization is terminated or revoked.  Performed at [facility], [place] [address] [place], [place] [address]     Procedures/Studies: DG Lumbar Spine Complete  Result Date: 02/28/2021 CLINICAL DATA:  Fall out of bed this morning. Low back pain. Initial encounter. EXAM: LUMBAR SPINE - COMPLETE 4+ VIEW COMPARISON:  09/23/2020 FINDINGS: There is no evidence of lumbar spine fracture. Alignment is normal. Severe degenerative disc disease is seen at L5-S1. Mild degenerative disc disease is seen at L2-3 and L3-4. No evidence of facet DJD or other significant bone  abnormality. Aortic atherosclerotic calcification noted. No significant change is seen compared to prior study. IMPRESSION: No acute findings. Degenerative spondylosis, as described above. Electronically Signed   By: Marlaine Hind M.D.   On: 02/28/2021 13:45   CT Head Wo Contrast  Result Date: 02/28/2021 CLINICAL DATA:  Patient fell and struck her head. EXAM: CT HEAD WITHOUT CONTRAST TECHNIQUE: Contiguous axial images were obtained from the base of the skull through the vertex without intravenous contrast. COMPARISON:  03/07/2020 FINDINGS: Brain: There is no evidence for acute hemorrhage, hydrocephalus, mass lesion, or abnormal extra-axial fluid collection. No definite CT evidence for acute infarction. Diffuse loss of parenchymal volume is consistent with atrophy. Patchy low attenuation in the deep hemispheric and periventricular white matter is nonspecific, but likely reflects chronic microvascular ischemic demyelination. Vascular: No hyperdense vessel or unexpected calcification. Skull: No evidence for fracture. No worrisome lytic or sclerotic lesion. Surgical changes noted right mastoid region. Sinuses/Orbits: The visualized paranasal sinuses and mastoid air cells are clear. Visualized portions of the globes and intraorbital fat are unremarkable. Other: None. IMPRESSION: 1. No acute intracranial abnormality. 2. Atrophy with chronic small vessel white matter ischemic disease. Electronically Signed   By: Misty Stanley M.D.   On: 02/28/2021 12:50   CT Shoulder Left Wo Contrast  Result Date: 02/28/2021 CLINICAL DATA:  Proximal humeral fracture. EXAM: CT OF THE UPPER LEFT EXTREMITY WITHOUT CONTRAST TECHNIQUE: Multidetector CT imaging of the upper left extremity was performed according to the standard protocol. COMPARISON:  Radiograph of same day. FINDINGS: Exam is somewhat limited due to persistent patient motion artifact. There does appear to be a minimally displaced fracture involving the surgical neck of the  proximal left humerus. Probable minimally displaced fracture is seen involving greater tuberosity of proximal left humerus. The glenoid, clavicle and visualized ribs are unremarkable. Probable edema or contusion is seen involving the surrounding subcutaneous tissues. IMPRESSION: Limited exam due to persistent patient motion artifact. Minimally displaced fracture is seen involving the surgical  neck of the proximal left humerus, with probable minimally displaced fracture involving the greater tuberosity of proximal left humerus. Electronically Signed   By: Marijo Conception M.D.   On: 02/28/2021 19:56   CT Hip Left Wo Contrast  Result Date: 02/28/2021 CLINICAL DATA:  Hip trauma fall EXAM: CT OF THE LEFT HIP WITHOUT CONTRAST TECHNIQUE: Multidetector CT imaging of the left hip was performed according to the standard protocol. Multiplanar CT image reconstructions were also generated. COMPARISON:  Radiograph 02/28/2021 FINDINGS: Bones/Joint/Cartilage Acute nondisplaced fracture involving the posterior wall of the acetabulum with articular surface extension. Pubic symphysis and rami appear intact. No femoral head dislocation. Sclerosis within the femoral head and neck. Ligaments Suboptimally assessed by CT. Muscles and Tendons Mild atrophy of left gluteus.  No intramuscular calcifications. Soft tissues Edema within the subcutaneous soft tissues of the posterior hip. IMPRESSION: 1. Acute nondisplaced fracture involving posterior wall of the acetabulum with extension of lucency to the articular surface. No proximal femoral fracture is visualized. 2. Heterogeneous sclerosis within the left femoral head and neck without femoral head deformity or collapse. Electronically Signed   By: Donavan Foil M.D.   On: 02/28/2021 18:19   DG Chest Port 1 View  Result Date: 02/28/2021 CLINICAL DATA:  Pt arrived via [facility] with c/c of Fall. Per EMS pt is on xeralto. Pt is from home, where EMS was sitting in floor beside bed where she  was sleeping and rolled out of bed. Pt arrives with L shoulder and hip pain EXAM: PORTABLE CHEST - 1 VIEW COMPARISON:  11/03/2020 FINDINGS: Relatively low lung volumes. Chronic linear scarring at the left lung base. No new infiltrate or overt edema. Heart size and mediastinal contours are within normal limits. Aortic Atherosclerosis (ICD10-170.0). No effusion.  No pneumothorax. Left humeral neck fracture. IMPRESSION: 1. No acute cardiopulmonary disease. 2. Left humeral neck fracture Electronically Signed   By: Lucrezia Europe M.D.   On: 02/28/2021 12:24   DG Shoulder Left Port  Result Date: 03/03/2021 CLINICAL DATA:  Left shoulder pain. EXAM: LEFT SHOULDER COMPARISON:  Radiograph and CT 3 days ago 02/28/2021 FINDINGS: Unchanged alignment of minimally displaced proximal humeral fracture involving the surgical neck and lateral humeral head. No articular involvement. No new fracture. Normal acromioclavicular alignment. IMPRESSION: Unchanged alignment of minimally displaced proximal humeral fracture involving the surgical neck and lateral humeral head. Electronically Signed   By: Keith Rake M.D.   On: 03/03/2021 14:20   DG Shoulder Left Portable  Result Date: 02/28/2021 CLINICAL DATA:  Pain post fall out of bed EXAM: LEFT SHOULDER COMPARISON:  None. FINDINGS: Minimally displaced fracture across the surgical neck of the humerus involving the greater tuberosity. No dislocation. No significant angulation deformity a or distraction. IMPRESSION: Minimally displaced fracture across the surgical neck of the humerus and greater tuberosity. Electronically Signed   By: Lucrezia Europe M.D.   On: 02/28/2021 12:26   DG Hip Unilat W or Wo Pelvis 2-3 Views Left  Result Date: 02/28/2021 CLINICAL DATA:  Fall. EXAM: DG HIP (WITH OR WITHOUT PELVIS) 2-3V LEFT COMPARISON:  09/23/2020. FINDINGS: Diffuse osteopenia and degenerative change. No evidence of fracture or dislocation. Sclerotic changes about both femoral heads. Avascular  necrosis cannot be excluded. Aortoiliac and peripheral vascular calcification. IMPRESSION: 1. Diffuse osteopenia degenerative change. No evidence of fracture dislocation. 2. Sclerotic changes both femoral heads. Avascular necrosis cannot be excluded. 3.  Peripheral vascular disease. Electronically Signed   By: Marcello Moores  Register   On: 02/28/2021 13:44  Time coordinating discharge: Over 30 minutes    Dwyane Dee, MD  [facility] 03/07/2021, 2:25 PM

## 2021-03-07 NOTE — Progress Notes (Signed)
Occupational Therapy Treatment Patient Details Name: [name] MRN: [id number] DOB: 07-23-1951 Today's Date: 03/07/2021    History of present illness [name] is a 70 y.o. female who presented to [facility] ED from home after an unwitnessed fall out of bed. Pt presents with minimally displaced fracture involving the surgical neck of the proximal left humerus, and Acute nondisplaced fracture involving posterior wall of the acetabulum with extension of lucency to the articular surface (non-surgical management for both). Medical history significant for chronic hyponatremia, permanent A. fib on Eliquis, chronic diastolic CHF, COPD, type 2 diabetes, GERD, hypertension, hyperlipidemia, spinal stenosis   OT comments  Pt presented in chair. Pt was able to complete self feeding tasks post set up of meal. Pt then completed lateral scooting and anterior/posterior with min assist and one step cues as noted as the session went on they became lethargic suspect due to medications. Pt currently with functional limitations due to the deficits listed below (see OT Problem List).  Pt will benefit from skilled OT to increase their safety and independence with ADL and functional mobility for ADL to facilitate discharge to venue listed below.    Follow Up Recommendations  SNF    Equipment Recommendations  Other (comment)    Recommendations for Other Services Rehab consult    Precautions / Restrictions Precautions Precautions: Fall Precaution Comments: posterior hip precautions reviewed given acetabular fx Required Braces or Orthoses: Sling Restrictions Weight Bearing Restrictions: Yes LUE Weight Bearing: Non weight bearing LLE Weight Bearing: Touchdown weight bearing       Mobility Bed Mobility Overal bed mobility:  (sitting in recliner)                  Transfers     Transfers: Lateral/Scoot Transfers                Balance Overall balance assessment: Needs  assistance Sitting-balance support: Feet supported;Single extremity supported Sitting balance-Leahy Scale: Fair                                     ADL either performed or assessed with clinical judgement   ADL Overall ADL's : Needs assistance/impaired Eating/Feeding: Set up;Sitting   Grooming: Wash/dry hands;Set up;Sitting   Upper Body Bathing: Moderate assistance;Cueing for safety;Cueing for sequencing;Sitting                             General ADL Comments: Pt worked on [employer], engineering      Cognition Arousal/Alertness: Awake/alert;Suspect due to medications (Pt was awake but then as session went on started to become tired and noted increase in time answering/following cues)   Overall Cognitive Status: Impaired/Different from baseline Area of Impairment: Following commands                       Following Commands: Follows one step commands consistently     Problem Solving: Slow processing;Difficulty sequencing;Requires verbal cues;Requires tactile cues          Exercises     Shoulder Instructions       General Comments      Pertinent Vitals/ Pain       Pain Assessment: Faces Faces Pain Scale: Hurts whole lot Pain Location: LLE and LUE Pain Descriptors /  Indicators: Grimacing;Moaning;Spasm  Home Living                                          Prior Functioning/Environment              Frequency  Min 2X/week        Progress Toward Goals  OT Goals(current goals can now be found in the care plan section)  Progress towards OT goals: Progressing toward goals  Acute Rehab OT Goals Patient Stated Goal: to get some rest OT Goal Formulation: With patient Time For Goal Achievement: 03/15/21 Potential to Achieve Goals: Fair ADL Goals Pt Will Perform Grooming: with set-up;sitting Pt Will Perform Upper Body Bathing: with set-up;sitting Pt Will Perform  Lower Body Bathing: with min assist;sitting/lateral leans Pt Will Perform Upper Body Dressing: with set-up;sitting Pt Will Perform Lower Body Dressing: with min assist;sitting/lateral leans Pt Will Transfer to Toilet: with min assist;bedside commode;squat pivot transfer  Plan Discharge plan remains appropriate    Co-evaluation                 AM-PAC OT "6 Clicks" Daily Activity     Outcome Measure   Help from another person eating meals?: A Little Help from another person taking care of personal grooming?: A Little Help from another person toileting, which includes using toliet, bedpan, or urinal?: Total Help from another person bathing (including washing, rinsing, drying)?: A Lot Help from another person to put on and taking off regular upper body clothing?: A Lot Help from another person to put on and taking off regular lower body clothing?: Total 6 Click Score: 12    End of Session    OT Visit Diagnosis: Unsteadiness on feet (R26.81);Other abnormalities of gait and mobility (R26.89);Muscle weakness (generalized) (M62.81);History of falling (Z91.81);Pain Pain - Right/Left: Left Pain - part of body: Leg;Arm;Hip   Activity Tolerance Patient limited by lethargy   Patient Left in chair;with call bell/phone within reach;with chair alarm set   Nurse Communication          Time: [id number] OT Time Calculation (min): 38 min  Charges: OT General Charges $OT Visit: 1 Visit OT Treatments $Self Care/Home Management : 38-52 mins  Joeseph Amor OTR/L  Acute Rehab Services  [phone] office number [phone] pager number    Joeseph Amor 03/07/2021, 12:08 PM

## 2021-03-07 NOTE — Progress Notes (Signed)
[facility] Progress Note   70 y.o. year-old woman hx of COPD, dCHF,  atrial fib, RLS, DM2 presented after a fall and was found to have a left hip fracture and a L humerus fracture.  Per ortho plan is for nonoperative Rx. She was hyponatremic which improved w/ IVF's. Na was 128 on admit, imrpoved to 130 , then down to 125 on 7/23 and 123 today.  Asked to see for hyponatremia.      Home meds include celebrex, norco , invokana, janumet, Kdur anoro ellipta, Cardizem CD, pepcid, ativan prn, lopressor , paxil, accupril, requip, crestor, demadex 20- 40 bid, trazodone  Assessment/ Plan:   Hyponatremia - w/ hx of same assoc w/ acute/ chronic diast CHF in the past, however here she does not show the signs of decomp diast CHF. I/O's here are negative,  she has no edema on exam , is on room air and had normal CXR on 7/19.  UNa and Uosm are both equivocal. Renal fxn is normal.  Na seems to be getting worse w/ diuretics here. Will dc torsemide and start gentle IVF"s w/ normal saline at 75 cc/hr. Moderate fluid restriction to 1200 for now.  Torsemide '20mg'$  BID 7/23 - 7/24 and also on Paxil. - Na slowly increasing from 123 -> 126 -> 129  w/ stable renal function - Need daily weights (last weight was 81.6 kg on 7/19). Total net neg 3092 during this hospitalization - Continue holding the diuretics for now.   Fall / L humerus fx / L hip fracture H/o chronic diast CHF COPD Permanent atrial fib DM2  Subjective:   Left leg / hip pain and spasm better today. Denies nausea today; also denies dyspnea but appetite not great.   Objective:   BP (!) 147/69 (BP Location: Right Arm)   Pulse 91   Temp 99 F (37.2 C) (Oral)   Resp 16   Ht '5\' 2"'$  (1.575 m)   Wt 81.6 kg   SpO2 95%   BMI 32.92 kg/m   Intake/Output Summary (Last 24 hours) at 03/07/2021 1151 Last data filed at 03/07/2021 0600 Gross per 24 hour  Intake 1692.18 ml  Output 3200 ml  Net -1507.82 ml   Weight change:   Physical  Exam: Gen alert, no distress Sclera anicteric, throat clear No jvd or bruits Chest clear bilat to bases, no rales/ wheezing RRR no MRG Abd soft ntnd no mass or ascites +bs Ext no LE or UE edema, no wounds or ulcers Neuro is easily arousable but lethargic  Imaging: No results found.  Labs: BMET Recent Labs  Lab 03/01/21 0018 03/02/21 0228 03/04/21 0309 03/05/21 0207 03/06/21 0152 03/07/21 0337  NA 128* 130* 125* 123* 126* 129*  K 5.2* 4.5 4.3 4.1 3.9 3.6  CL 96* 97* 94* 91* 93* 98  CO2 '24 24 23 '$ 20* 22 22  GLUCOSE 160* 153* 162* 168* 235* 164*  BUN '23 13 14 18 15 10  '$ CREATININE 1.50* 1.01* 0.98 0.93 0.91 0.70  CALCIUM 8.8* 9.0 8.8* 8.4* 8.5* 8.7*  PHOS 4.5  --   --  3.3  --   --    CBC Recent Labs  Lab 03/04/21 0309 03/05/21 0207 03/06/21 0152 03/07/21 0337  WBC 9.5 8.1 8.9 8.2  HGB 8.1* 8.1* 8.7* 8.3*  HCT 23.9* 23.4* 24.7* 25.0*  MCV 87.2 87.3 85.5 89.6  PLT 177 188 203 234    Medications:     amoxicillin  500 mg Oral Q8H  diltiazem  180 mg Oral Daily   famotidine  20 mg Oral BID   guaiFENesin  600 mg Oral BID   insulin aspart  0-5 Units Subcutaneous QHS   insulin aspart  0-9 Units Subcutaneous TID WC   latanoprost  1 drop Both Eyes QHS   metoprolol succinate  100 mg Oral BID   PARoxetine  25 mg Oral Daily   polyethylene glycol  17 g Oral Daily   rosuvastatin  10 mg Oral QHS   traZODone  50 mg Oral QHS   umeclidinium-vilanterol  1 puff Inhalation Daily      Otelia Santee, MD 03/07/2021, 11:51 AM

## 2021-03-08 ENCOUNTER — Encounter: Payer: Self-pay | Admitting: Internal Medicine

## 2021-03-09 ENCOUNTER — Telehealth: Payer: Self-pay | Admitting: Internal Medicine

## 2021-03-09 DIAGNOSIS — J449 Chronic obstructive pulmonary disease, unspecified: Secondary | ICD-10-CM | POA: Diagnosis not present

## 2021-03-09 DIAGNOSIS — S42302D Unspecified fracture of shaft of humerus, left arm, subsequent encounter for fracture with routine healing: Secondary | ICD-10-CM | POA: Diagnosis not present

## 2021-03-09 DIAGNOSIS — I4821 Permanent atrial fibrillation: Secondary | ICD-10-CM | POA: Diagnosis not present

## 2021-03-09 DIAGNOSIS — S32402D Unspecified fracture of left acetabulum, subsequent encounter for fracture with routine healing: Secondary | ICD-10-CM | POA: Diagnosis not present

## 2021-03-09 NOTE — Telephone Encounter (Signed)
Patient recently fallen  Transported from ED to [facility] on [address]  Patient says she was given some type of protective padding to help relieve bedsores from ED but the rehab center does not have the padding available & she is starting to notice bedsores.  Would like to know how to go about purchasing padding or where in stores can she pick it up  Requesting callback [phone]

## 2021-03-10 NOTE — Telephone Encounter (Signed)
See below

## 2021-03-10 NOTE — Telephone Encounter (Signed)
I do not have any idea what she is talking about but could try a medical supply store if she is looking for medical supplies.

## 2021-03-10 NOTE — Telephone Encounter (Signed)
Called patient. LVM with recommendations from Dr. Sharlet Salina. Office number was provided.

## 2021-03-14 DIAGNOSIS — M5459 Other low back pain: Secondary | ICD-10-CM | POA: Diagnosis not present

## 2021-03-14 DIAGNOSIS — R262 Difficulty in walking, not elsewhere classified: Secondary | ICD-10-CM | POA: Diagnosis not present

## 2021-03-14 DIAGNOSIS — M79602 Pain in left arm: Secondary | ICD-10-CM | POA: Diagnosis not present

## 2021-03-14 DIAGNOSIS — M79605 Pain in left leg: Secondary | ICD-10-CM | POA: Diagnosis not present

## 2021-03-21 DIAGNOSIS — S32455A Nondisplaced transverse fracture of left acetabulum, initial encounter for closed fracture: Secondary | ICD-10-CM | POA: Diagnosis not present

## 2021-03-21 DIAGNOSIS — S42292A Other displaced fracture of upper end of left humerus, initial encounter for closed fracture: Secondary | ICD-10-CM | POA: Diagnosis not present

## 2021-03-22 DIAGNOSIS — F410 Panic disorder [episodic paroxysmal anxiety] without agoraphobia: Secondary | ICD-10-CM | POA: Diagnosis not present

## 2021-03-22 DIAGNOSIS — M25512 Pain in left shoulder: Secondary | ICD-10-CM | POA: Diagnosis not present

## 2021-03-22 DIAGNOSIS — F411 Generalized anxiety disorder: Secondary | ICD-10-CM | POA: Diagnosis not present

## 2021-03-23 DIAGNOSIS — E871 Hypo-osmolality and hyponatremia: Secondary | ICD-10-CM | POA: Diagnosis not present

## 2021-03-23 DIAGNOSIS — E860 Dehydration: Secondary | ICD-10-CM | POA: Diagnosis not present

## 2021-03-23 DIAGNOSIS — I5032 Chronic diastolic (congestive) heart failure: Secondary | ICD-10-CM | POA: Diagnosis not present

## 2021-03-23 DIAGNOSIS — N179 Acute kidney failure, unspecified: Secondary | ICD-10-CM | POA: Diagnosis not present

## 2021-03-26 ENCOUNTER — Telehealth: Payer: Self-pay | Admitting: Internal Medicine

## 2021-03-26 NOTE — Telephone Encounter (Signed)
Pt called regarding BP tonight, as low as 100/40 at her rehab center, was feeling a little dizzy. Feeling better and BP better at the time of her call. Suspect significant polypharmacy contributing if not primary issue. Symptoms seem to correlate with starting oxycodone '10mg'$ , so she will decrease to '5mg'$  and try Tylenol and follow-up with her PCP.

## 2021-03-27 ENCOUNTER — Telehealth: Payer: Self-pay | Admitting: Emergency Medicine

## 2021-03-27 DIAGNOSIS — I951 Orthostatic hypotension: Secondary | ICD-10-CM | POA: Diagnosis not present

## 2021-03-27 DIAGNOSIS — I5032 Chronic diastolic (congestive) heart failure: Secondary | ICD-10-CM | POA: Diagnosis not present

## 2021-03-27 DIAGNOSIS — E871 Hypo-osmolality and hyponatremia: Secondary | ICD-10-CM | POA: Diagnosis not present

## 2021-03-27 DIAGNOSIS — E860 Dehydration: Secondary | ICD-10-CM | POA: Diagnosis not present

## 2021-03-27 NOTE — Telephone Encounter (Signed)
Noted  

## 2021-03-27 NOTE — Telephone Encounter (Signed)
See below

## 2021-03-29 DIAGNOSIS — R42 Dizziness and giddiness: Secondary | ICD-10-CM | POA: Diagnosis not present

## 2021-03-29 DIAGNOSIS — L2089 Other atopic dermatitis: Secondary | ICD-10-CM | POA: Diagnosis not present

## 2021-03-29 DIAGNOSIS — I480 Paroxysmal atrial fibrillation: Secondary | ICD-10-CM | POA: Diagnosis not present

## 2021-03-29 DIAGNOSIS — I951 Orthostatic hypotension: Secondary | ICD-10-CM | POA: Diagnosis not present

## 2021-03-31 DIAGNOSIS — B372 Candidiasis of skin and nail: Secondary | ICD-10-CM | POA: Diagnosis not present

## 2021-03-31 DIAGNOSIS — L304 Erythema intertrigo: Secondary | ICD-10-CM | POA: Diagnosis not present

## 2021-03-31 DIAGNOSIS — L539 Erythematous condition, unspecified: Secondary | ICD-10-CM | POA: Diagnosis not present

## 2021-04-04 DIAGNOSIS — I10 Essential (primary) hypertension: Secondary | ICD-10-CM | POA: Diagnosis not present

## 2021-04-04 DIAGNOSIS — I4821 Permanent atrial fibrillation: Secondary | ICD-10-CM | POA: Diagnosis not present

## 2021-04-04 DIAGNOSIS — I5032 Chronic diastolic (congestive) heart failure: Secondary | ICD-10-CM | POA: Diagnosis not present

## 2021-04-04 DIAGNOSIS — J449 Chronic obstructive pulmonary disease, unspecified: Secondary | ICD-10-CM | POA: Diagnosis not present

## 2021-04-09 DIAGNOSIS — G2581 Restless legs syndrome: Secondary | ICD-10-CM | POA: Diagnosis not present

## 2021-04-09 DIAGNOSIS — I4821 Permanent atrial fibrillation: Secondary | ICD-10-CM | POA: Diagnosis not present

## 2021-04-09 DIAGNOSIS — F39 Unspecified mood [affective] disorder: Secondary | ICD-10-CM | POA: Diagnosis not present

## 2021-04-09 DIAGNOSIS — K5900 Constipation, unspecified: Secondary | ICD-10-CM | POA: Diagnosis not present

## 2021-04-09 DIAGNOSIS — S42202D Unspecified fracture of upper end of left humerus, subsequent encounter for fracture with routine healing: Secondary | ICD-10-CM | POA: Diagnosis not present

## 2021-04-09 DIAGNOSIS — L299 Pruritus, unspecified: Secondary | ICD-10-CM | POA: Diagnosis not present

## 2021-04-09 DIAGNOSIS — D649 Anemia, unspecified: Secondary | ICD-10-CM | POA: Diagnosis not present

## 2021-04-09 DIAGNOSIS — E46 Unspecified protein-calorie malnutrition: Secondary | ICD-10-CM | POA: Diagnosis not present

## 2021-04-09 DIAGNOSIS — I5032 Chronic diastolic (congestive) heart failure: Secondary | ICD-10-CM | POA: Diagnosis not present

## 2021-04-09 DIAGNOSIS — L209 Atopic dermatitis, unspecified: Secondary | ICD-10-CM | POA: Diagnosis not present

## 2021-04-09 DIAGNOSIS — Z7901 Long term (current) use of anticoagulants: Secondary | ICD-10-CM | POA: Diagnosis not present

## 2021-04-09 DIAGNOSIS — Z9181 History of falling: Secondary | ICD-10-CM | POA: Diagnosis not present

## 2021-04-09 DIAGNOSIS — R32 Unspecified urinary incontinence: Secondary | ICD-10-CM | POA: Diagnosis not present

## 2021-04-09 DIAGNOSIS — K219 Gastro-esophageal reflux disease without esophagitis: Secondary | ICD-10-CM | POA: Diagnosis not present

## 2021-04-09 DIAGNOSIS — E785 Hyperlipidemia, unspecified: Secondary | ICD-10-CM | POA: Diagnosis not present

## 2021-04-09 DIAGNOSIS — I110 Hypertensive heart disease with heart failure: Secondary | ICD-10-CM | POA: Diagnosis not present

## 2021-04-09 DIAGNOSIS — J449 Chronic obstructive pulmonary disease, unspecified: Secondary | ICD-10-CM | POA: Diagnosis not present

## 2021-04-09 DIAGNOSIS — Z7984 Long term (current) use of oral hypoglycemic drugs: Secondary | ICD-10-CM | POA: Diagnosis not present

## 2021-04-09 DIAGNOSIS — S40022D Contusion of left upper arm, subsequent encounter: Secondary | ICD-10-CM | POA: Diagnosis not present

## 2021-04-09 DIAGNOSIS — E871 Hypo-osmolality and hyponatremia: Secondary | ICD-10-CM | POA: Diagnosis not present

## 2021-04-09 DIAGNOSIS — F339 Major depressive disorder, recurrent, unspecified: Secondary | ICD-10-CM | POA: Diagnosis not present

## 2021-04-09 DIAGNOSIS — S32402D Unspecified fracture of left acetabulum, subsequent encounter for fracture with routine healing: Secondary | ICD-10-CM | POA: Diagnosis not present

## 2021-04-09 DIAGNOSIS — E1142 Type 2 diabetes mellitus with diabetic polyneuropathy: Secondary | ICD-10-CM | POA: Diagnosis not present

## 2021-04-09 DIAGNOSIS — F419 Anxiety disorder, unspecified: Secondary | ICD-10-CM | POA: Diagnosis not present

## 2021-04-10 ENCOUNTER — Telehealth: Payer: Self-pay | Admitting: Internal Medicine

## 2021-04-10 NOTE — Telephone Encounter (Signed)
[place] Name: [place] Name: [place] Phone #: [phone] Service Requested: Skilled Nursing Frequency of Visits: 2 week 1 1 week 3 1 month 1 2 prn

## 2021-04-10 NOTE — Telephone Encounter (Signed)
See below

## 2021-04-11 ENCOUNTER — Other Ambulatory Visit: Payer: Self-pay | Admitting: Internal Medicine

## 2021-04-11 ENCOUNTER — Encounter: Payer: Self-pay | Admitting: Internal Medicine

## 2021-04-11 DIAGNOSIS — S32455A Nondisplaced transverse fracture of left acetabulum, initial encounter for closed fracture: Secondary | ICD-10-CM | POA: Diagnosis not present

## 2021-04-11 DIAGNOSIS — S42292A Other displaced fracture of upper end of left humerus, initial encounter for closed fracture: Secondary | ICD-10-CM | POA: Diagnosis not present

## 2021-04-11 NOTE — Telephone Encounter (Signed)
Verbals are okay.

## 2021-04-13 NOTE — Telephone Encounter (Signed)
Called Pam. LVM on a secured line with verbal orders from Dr. Sharlet Salina. Office number was provided.

## 2021-04-14 ENCOUNTER — Encounter: Payer: Self-pay | Admitting: Internal Medicine

## 2021-04-14 ENCOUNTER — Telehealth: Payer: Self-pay | Admitting: Internal Medicine

## 2021-04-14 ENCOUNTER — Other Ambulatory Visit: Payer: Self-pay

## 2021-04-14 ENCOUNTER — Ambulatory Visit (INDEPENDENT_AMBULATORY_CARE_PROVIDER_SITE_OTHER): Payer: HMO | Admitting: Internal Medicine

## 2021-04-14 VITALS — BP 128/70 | HR 76 | Resp 18 | Ht 62.0 in

## 2021-04-14 DIAGNOSIS — E1169 Type 2 diabetes mellitus with other specified complication: Secondary | ICD-10-CM | POA: Diagnosis not present

## 2021-04-14 DIAGNOSIS — E871 Hypo-osmolality and hyponatremia: Secondary | ICD-10-CM | POA: Diagnosis not present

## 2021-04-14 DIAGNOSIS — R3911 Hesitancy of micturition: Secondary | ICD-10-CM

## 2021-04-14 DIAGNOSIS — S42202A Unspecified fracture of upper end of left humerus, initial encounter for closed fracture: Secondary | ICD-10-CM | POA: Diagnosis not present

## 2021-04-14 DIAGNOSIS — S32402A Unspecified fracture of left acetabulum, initial encounter for closed fracture: Secondary | ICD-10-CM | POA: Diagnosis not present

## 2021-04-14 DIAGNOSIS — D500 Iron deficiency anemia secondary to blood loss (chronic): Secondary | ICD-10-CM

## 2021-04-14 LAB — URINALYSIS, ROUTINE W REFLEX MICROSCOPIC
Bilirubin Urine: NEGATIVE
Ketones, ur: NEGATIVE
Nitrite: POSITIVE — AB
Specific Gravity, Urine: <=1.005 — AB (ref 1.000–1.030)
Total Protein, Urine: NEGATIVE
Urine Glucose: >=1000 — AB
Urobilinogen, UA: 0.2 (ref 0.0–1.0)
pH: 6 (ref 5.0–8.0)

## 2021-04-14 MED ORDER — HYDROCODONE-ACETAMINOPHEN 5-325 MG PO TABS: 1.0000 | ORAL_TABLET | Freq: Two times a day (BID) | ORAL | 0 refills | Status: DC | PRN

## 2021-04-14 NOTE — Telephone Encounter (Signed)
See below

## 2021-04-14 NOTE — Progress Notes (Signed)
   Subjective:   Patient ID: [name], female    DOB: 07-09-1951, 70 y.o.   MRN: [id number]  HPI The patient is a 70 YO female coming in for hospital and SNF follow up (did break left hip and left humerus bone after fall, is done seeing orthopedics, non-operative management). She is doing PT at home and using walker currently. Is still having significant pain in the hip with walking and after PT. Has been using oxycodone up to 3 per day and would like to switch back to her hydrocodone. Will likely need 2 per day minimal to take with PT and at night time as this is the most painful time. Eating and drinking normally. Sleeping okay. Denies chest pains or SOB. No diarrhea or constipation.   PMH, [facility], social history reviewed and updated  Review of Systems  Constitutional: Negative.   HENT: Negative.    Eyes: Negative.   Respiratory:  Negative for cough, chest tightness and shortness of breath.   Cardiovascular:  Negative for chest pain, palpitations and leg swelling.  Gastrointestinal:  Negative for abdominal distention, abdominal pain, constipation, diarrhea, nausea and vomiting.  Musculoskeletal:  Positive for arthralgias, gait problem and myalgias.  Skin: Negative.   Psychiatric/Behavioral: Negative.     Objective:  Physical Exam Constitutional:      Appearance: She is well-developed.  HENT:     Head: Normocephalic and atraumatic.  Cardiovascular:     Rate and Rhythm: Normal rate and regular rhythm.  Pulmonary:     Effort: Pulmonary effort is normal. No respiratory distress.     Breath sounds: Normal breath sounds. No wheezing or rales.  Abdominal:     General: Bowel sounds are normal. There is no distension.     Palpations: Abdomen is soft.     Tenderness: There is no abdominal tenderness. There is no rebound.  Musculoskeletal:     Cervical back: Normal range of motion.  Skin:    General: Skin is warm and dry.  Neurological:     Mental Status: She is alert and oriented to  person, place, and time.     Coordination: Coordination abnormal.     Comments: Wheelchair today, walker at home    Vitals:   04/14/21 0845  BP: 128/70  Pulse: 76  Resp: 18  SpO2: 98%  Height: '5\' 2"'$  (1.575 m)    This visit occurred during the SARS-CoV-2 public health emergency.  Safety protocols were in place, including screening questions prior to the visit, additional usage of staff PPE, and extensive cleaning of exam room while observing appropriate contact time as indicated for disinfecting solutions.   Assessment & Plan:

## 2021-04-14 NOTE — Telephone Encounter (Signed)
[place] Name: [facility] Agency Name: [facility] Phone #: [phone] Service Requested: PT Frequency of Visits: 2 week 4

## 2021-04-14 NOTE — Assessment & Plan Note (Signed)
Stable Na 129 on discharge date from rehab 10 days ago. Will not check labs today. Needs intermittent monitoring and is on fluid restriction.

## 2021-04-14 NOTE — Telephone Encounter (Signed)
[employer] LDVM with verbal orders. Office number was provided.

## 2021-04-14 NOTE — Patient Instructions (Signed)
We have refilled the hydrocodone to take up to twice a day.

## 2021-04-14 NOTE — Assessment & Plan Note (Signed)
Non-operative management and is now weightbearing. She is released from orthopedic care and is working with home PT to improve strength and mobility. Talked about nutrition and protein intake to help.

## 2021-04-14 NOTE — Assessment & Plan Note (Addendum)
Sugars stable during hospital and rehab stay. She is taking janumet 50/1000 1/2 in the morning and 1 at night time. Is also taking invokana. On statin.

## 2021-04-14 NOTE — Telephone Encounter (Signed)
Okay with verbals

## 2021-04-14 NOTE — Assessment & Plan Note (Signed)
CBC reviewed from discharge day of rehab facility and stable. Taking xarelto 20 mg daily due to A fib and no signs of current bleeding. She is aware to watch for dark stools.

## 2021-04-14 NOTE — Assessment & Plan Note (Signed)
Is cleared for weightbearing and working with PT. Is still restricted due to pain.

## 2021-04-15 LAB — URINE CULTURE

## 2021-04-19 ENCOUNTER — Encounter: Payer: Self-pay | Admitting: Internal Medicine

## 2021-04-19 DIAGNOSIS — R300 Dysuria: Secondary | ICD-10-CM

## 2021-04-20 ENCOUNTER — Telehealth: Payer: Self-pay

## 2021-04-20 NOTE — Telephone Encounter (Signed)
See below

## 2021-04-20 NOTE — Telephone Encounter (Signed)
Request for custodial care per Carter Kitten, RN at [facility]  [phone]

## 2021-04-21 NOTE — Telephone Encounter (Signed)
What does that mean?

## 2021-04-23 ENCOUNTER — Encounter: Payer: Self-pay | Admitting: Internal Medicine

## 2021-04-24 ENCOUNTER — Encounter: Payer: Self-pay | Admitting: Internal Medicine

## 2021-04-24 ENCOUNTER — Other Ambulatory Visit (INDEPENDENT_AMBULATORY_CARE_PROVIDER_SITE_OTHER): Payer: HMO

## 2021-04-24 DIAGNOSIS — R300 Dysuria: Secondary | ICD-10-CM | POA: Diagnosis not present

## 2021-04-24 LAB — URINALYSIS, ROUTINE W REFLEX MICROSCOPIC
Bilirubin Urine: NEGATIVE
Ketones, ur: NEGATIVE
Nitrite: NEGATIVE
Specific Gravity, Urine: <=1.005 — AB (ref 1.000–1.030)
Total Protein, Urine: NEGATIVE
Urine Glucose: >=1000 — AB
Urobilinogen, UA: 0.2 (ref 0.0–1.0)
pH: 6 (ref 5.0–8.0)

## 2021-04-25 ENCOUNTER — Ambulatory Visit: Payer: HMO | Admitting: Podiatry

## 2021-04-25 NOTE — Telephone Encounter (Signed)
Called [employer]. LVM for Lattie Haw to return my call. Office number was provided.

## 2021-04-27 LAB — URINE CULTURE

## 2021-04-28 ENCOUNTER — Ambulatory Visit: Payer: HMO | Admitting: Podiatry

## 2021-04-28 ENCOUNTER — Other Ambulatory Visit: Payer: Self-pay | Admitting: Internal Medicine

## 2021-04-28 MED ORDER — CEPHALEXIN 500 MG PO CAPS: 500.0000 mg | ORAL_CAPSULE | Freq: Two times a day (BID) | ORAL | 0 refills | Status: AC

## 2021-05-01 ENCOUNTER — Ambulatory Visit: Payer: HMO | Admitting: Internal Medicine

## 2021-05-02 ENCOUNTER — Telehealth: Payer: Self-pay | Admitting: Internal Medicine

## 2021-05-02 NOTE — Telephone Encounter (Signed)
[place] Name: [facility] Agency Name: [facility] Phone #: [phone] Service Requested: OT Frequency of Visits: 1-2 weekly depending on what insurance will allow

## 2021-05-04 ENCOUNTER — Telehealth: Payer: Self-pay | Admitting: Internal Medicine

## 2021-05-04 NOTE — Telephone Encounter (Signed)
PT nurse calling in  Patient complaining of pain while doing PT.. would like a refill request submitted for Oxycodone 5mg  immediate release  Also has a question about patient canagliflozin (INVOKANA) 100 MG TABS tablet

## 2021-05-09 DIAGNOSIS — S32455D Nondisplaced transverse fracture of left acetabulum, subsequent encounter for fracture with routine healing: Secondary | ICD-10-CM | POA: Diagnosis not present

## 2021-05-10 NOTE — Telephone Encounter (Signed)
LVM asking the patient to call back to discuss her Invokana. Office number was provided.

## 2021-05-12 ENCOUNTER — Other Ambulatory Visit: Payer: Self-pay | Admitting: Internal Medicine

## 2021-05-15 ENCOUNTER — Telehealth: Payer: Self-pay

## 2021-05-15 NOTE — Telephone Encounter (Signed)
1.Medication Requested: hydrocodone  2. Pharmacy (Name, [place]): [facility]  3. On Med List: yes  4. Last Visit with PCP: 04/14/2021     Agent: Please be advised that RX refills may take up to 3 business days. We ask that you follow-up with your pharmacy.

## 2021-05-16 NOTE — Telephone Encounter (Signed)
Filled via refill encounter

## 2021-05-16 NOTE — Telephone Encounter (Signed)
See below

## 2021-05-16 NOTE — Telephone Encounter (Signed)
Fine for verbals °

## 2021-05-17 NOTE — Telephone Encounter (Signed)
LVM with verbal orders. Office number was provided in case their are any additional questions or concerns.

## 2021-05-19 ENCOUNTER — Telehealth: Payer: Self-pay | Admitting: Cardiovascular Disease

## 2021-05-19 NOTE — Telephone Encounter (Signed)
Pt called c/o of left calf swelling x 2 days. She state its very tight, a little red, but denies pain. Pt also report some SOB.   Based on symptoms, nurse recommended pt report to ER for further evaluations as pt has a hx of DVT. Pt state she will try to find transportation then go,

## 2021-05-19 NOTE — Telephone Encounter (Signed)
Patient called and wanted to know if there are any natural remedies she could do for her swollen legs or anythng else

## 2021-05-22 ENCOUNTER — Other Ambulatory Visit: Payer: Self-pay | Admitting: Internal Medicine

## 2021-05-22 ENCOUNTER — Other Ambulatory Visit: Payer: Self-pay | Admitting: Orthopedic Surgery

## 2021-05-22 DIAGNOSIS — M25552 Pain in left hip: Secondary | ICD-10-CM

## 2021-05-22 MED ORDER — HYDROCODONE-ACETAMINOPHEN 5-325 MG PO TABS: 1.0000 | ORAL_TABLET | Freq: Two times a day (BID) | ORAL | 0 refills | Status: DC | PRN

## 2021-05-24 ENCOUNTER — Other Ambulatory Visit: Payer: Self-pay | Admitting: Internal Medicine

## 2021-05-26 ENCOUNTER — Other Ambulatory Visit: Payer: Self-pay

## 2021-05-26 ENCOUNTER — Telehealth: Payer: Self-pay | Admitting: Cardiovascular Disease

## 2021-05-26 MED ORDER — XARELTO 20 MG PO TABS: 20.0000 mg | ORAL_TABLET | Freq: Every day | ORAL | 0 refills | Status: DC

## 2021-05-26 NOTE — Telephone Encounter (Signed)
Patient calling the office for samples of medication:   1.  What medication and dosage are you requesting samples for? rivaroxaban (XARELTO) 20 MG TABS tablet  2.  Are you currently out of this medication? Yes   If the office does not have any samples. Please send an rx for a 15 day supply to [facility], [place] for the patient

## 2021-05-26 NOTE — Telephone Encounter (Signed)
Prescription refill request for Xarelto received.  Indication:Afib Last office visit:4/22 Weight:81.6 kg Age:70 Scr:0.7 CrCl:93.58 ml/min  Prescription refilled

## 2021-05-29 ENCOUNTER — Other Ambulatory Visit: Payer: Self-pay | Admitting: Internal Medicine

## 2021-05-30 ENCOUNTER — Telehealth: Payer: Self-pay | Admitting: Internal Medicine

## 2021-05-30 ENCOUNTER — Telehealth: Payer: Self-pay | Admitting: Cardiovascular Disease

## 2021-05-30 DIAGNOSIS — H52203 Unspecified astigmatism, bilateral: Secondary | ICD-10-CM | POA: Diagnosis not present

## 2021-05-30 DIAGNOSIS — E119 Type 2 diabetes mellitus without complications: Secondary | ICD-10-CM | POA: Diagnosis not present

## 2021-05-30 DIAGNOSIS — H2513 Age-related nuclear cataract, bilateral: Secondary | ICD-10-CM | POA: Diagnosis not present

## 2021-05-30 DIAGNOSIS — H401131 Primary open-angle glaucoma, bilateral, mild stage: Secondary | ICD-10-CM | POA: Diagnosis not present

## 2021-05-30 LAB — HM DIABETES EYE EXAM

## 2021-05-30 NOTE — Telephone Encounter (Signed)
[name] from [facility] calling in  Patient reported fall Sunday morning (10.16.22) on right side.. no signs of bruising.. patient not complaining of any pain

## 2021-05-30 NOTE — Telephone Encounter (Signed)
I s/w the pt today and offered her a sooner appt for her pre op clearance. Pt is acceptable though would like to still keep the 07/2021 appt with Dr. Sallyanne Kuster. Pt has been scheduled to see Caron Presume, [facility] 05/31/21 at @ 9:45 at the [facility] office. Pt is grateful for the call and the appt. I will forward notes PAC for appt tomorrow. Will send FYI to surgeon's office pt has appt 05/31/21.

## 2021-05-30 NOTE — Telephone Encounter (Signed)
Noted  

## 2021-05-30 NOTE — Telephone Encounter (Signed)
   [facility] Pre-operative Risk Assessment    Patient Name: [name]  DOB: Dec 15, 1950 MRN: [id number]  HEARTCARE STAFF:  - IMPORTANT!!!!!! Under Visit Info/Reason for Call, type in Other and utilize the format Clearance MM/DD/YY or Clearance TBD. Do not use dashes or single digits. - Please review there is not already an duplicate clearance open for this procedure. - If request is for dental extraction, please clarify the # of teeth to be extracted. - If the patient is currently at the dentist's office, call Pre-Op Callback Staff (MA/nurse) to input urgent request.  - If the patient is not currently in the dentist office, please route to the Pre-Op pool.  Request for surgical clearance:  What type of surgery is being performed? Left Hip Replacement  When is this surgery scheduled? pending  What type of clearance is required (medical clearance vs. Pharmacy clearance to hold med vs. Both)? both  Are there any medications that need to be held prior to surgery and how long? Is there any medicine that he needs to hold?  Practice name and name of physician performing surgery? Dr [name]  What is the office phone number? [phone]   7.   What is the office fax number?  [phone]  8.   Anesthesia type (None, local, MAC, general) ? Spinal   [name] 05/30/2021, 12:27 PM  _________________________________________________________________   (provider comments below)

## 2021-05-30 NOTE — Progress Notes (Signed)
Cardiology Office Note:    Date:  05/30/2021   ID:  [name], DOB 08/22/1950, MRN [id number]  PCP:  Hoyt Koch, MD [place] Cardiologist: Sanda Klein, MD   Reason for visit: Preop clearance  History of Present Illness:    [name] is a 70 y.o. female with a hx of CAD (posterior lateral ventricular branch stent in 2009), longstanding persistent atrial fibrillation, diabetes mellitus type 2, hypertension, pericardiocentesis for hemothorax with tamponade in June 2020, while on treatment with oral anticoagulant. She is a retired Electrical engineer.  She had lower GI bleeding in March 2022 that required blood transfusion x2 units.  They identified a couple AVMs in the colon which were cauterized.  Xarelto was restarted without recurrent bleeding.  She last saw Dr. Sallyanne Kuster in April 2022 and was doing well.  Today, patient comes in with her wife.  She has had left knee pain post fall on July 19.  She was nonweightbearing for 6 weeks.  She improved but then her cartilage completely gave out.  She is awaiting left knee replacement surgery date.  She denies chest pain.  She has some shortness of breath with exertion that is stable.  She denies lightheadedness and syncope.  She states she is sensitive to her heart rate.  If her heart rate gets to the 100s, she takes a metoprolol tartrate 25 mg; this only happens 6-7 times per year.  She denies PND.  She has mild lower extremity edema.  She states she was off torsemide while in the hospital post fall and at rehab.  She has restarted her torsemide and compression stockings.  Before her fall in July, she was able to run several errands a day.       Past Medical History:  Diagnosis Date   Anemia, unspecified    Arthritis    Asthma    Atrial fibrillation with RVR ([place])    a. on Xarelto   Bell's palsy    Facial nerve decompression in 2001   CHF (congestive heart failure) (HCC)    Chronic low back pain     COPD with asthma ([place])    Coronary artery disease    Myoview 04/12/11 was entirely normal. ECHO 02/26/08 showed only minor abnormalities. Stenting 05/26/08 of her posterolateral branch to the left circumflex coronary artery. Used a 2.5x41mm Taxus Monorail stent.myoview 2014 was without ischemia   Diabetes mellitus    Type 2   Early cataracts, bilateral    Fatty liver    GERD (gastroesophageal reflux disease)    Glaucoma    Glaucoma    Goiter    Heart murmur    History of nuclear stress test 2012; 2014   lexiscan; normal pattern of perfusion; normal, low risk scan    Hyperlipidemia    Hypertension    Panic disorder    Pneumonia 2008   Polycystic ovary disease    Hysterectomy in 1982 for this   Shortness of breath dyspnea    ECHO 02/26/08 showed only minor abnormalities   Spinal stenosis     Past Surgical History:  Procedure Laterality Date   ABDOMINAL HYSTERECTOMY  1982   & BSO; for polycystic ovary disease   CARDIOVERSION N/A 12/17/2013   Procedure: CARDIOVERSION;  Surgeon: Pixie Casino, MD;  Location: [facility];  Service: Cardiovascular;  Laterality: N/A;   CARDIOVERSION N/A 09/30/2015   Procedure: CARDIOVERSION;  Surgeon: Dorothy Spark, MD;  Location: [place];  Service: Cardiovascular;  Laterality:  N/A;   CARDIOVERSION N/A 06/22/2016   Procedure: CARDIOVERSION;  Surgeon: Skeet Latch, MD;  Location: [place];  Service: Cardiovascular;  Laterality: N/A;   CENTRAL LINE INSERTION  01/13/2019   Procedure: CENTRAL LINE INSERTION;  Surgeon: Jolaine Artist, MD;  Location: [place] [facility];  Service: Cardiovascular;;   COLONOSCOPY     last 2009; Dr Cristina Gong; due 2019   COLONOSCOPY WITH PROPOFOL N/A 01/23/2018   Procedure: COLONOSCOPY WITH PROPOFOL;  Surgeon: Ronald Lobo, MD;  Location: [facility];  Service: Endoscopy;  Laterality: N/A;   COLONOSCOPY WITH PROPOFOL N/A 11/04/2020   Procedure: COLONOSCOPY WITH PROPOFOL;  Surgeon: Ronald Lobo, MD;   Location: [place];  Service: Endoscopy;  Laterality: N/A;   CORONARY ANGIOPLASTY  05/26/2008   Stenting of her posterolateral branch to the left circumflex coronary artery. Used a 2.5x65mm Taxus Monorail stent.   ESOPHAGOGASTRODUODENOSCOPY (EGD) WITH PROPOFOL N/A 01/23/2018   Procedure: ESOPHAGOGASTRODUODENOSCOPY (EGD) WITH PROPOFOL;  Surgeon: Ronald Lobo, MD;  Location: [facility];  Service: Endoscopy;  Laterality: N/A;   ESOPHAGOGASTRODUODENOSCOPY (EGD) WITH PROPOFOL N/A 11/04/2020   Procedure: ESOPHAGOGASTRODUODENOSCOPY (EGD) WITH PROPOFOL;  Surgeon: Ronald Lobo, MD;  Location: [place];  Service: Endoscopy;  Laterality: N/A;   FACIAL NERVE DECOMPRESSION  2001/2002   bells palsy    GIVENS CAPSULE STUDY N/A 11/04/2020   Procedure: GIVENS CAPSULE STUDY;  Surgeon: Ronald Lobo, MD;  Location: [facility];  Service: Endoscopy;  Laterality: N/A;   HOT HEMOSTASIS N/A 11/04/2020   Procedure: HOT HEMOSTASIS (ARGON PLASMA COAGULATION/BICAP);  Surgeon: Ronald Lobo, MD;  Location[facility];  Service: Endoscopy;  Laterality: N/A;   LAPAROSCOPIC CHOLECYSTECTOMY  06/15/2011    Dr Dalbert Batman   PERICARDIOCENTESIS N/A 01/13/2019   Procedure: PERICARDIOCENTESIS;  Surgeon: Burnell Blanks, MD;  Location: [facility] [facility];  Service: Cardiovascular;  Laterality: N/A;   POLYPECTOMY  11/04/2020   Procedure: POLYPECTOMY;  Surgeon: Ronald Lobo, MD;  Location: [place];  Service: Endoscopy;;   RIGHT AND LEFT HEART CATH N/A 01/13/2019   Procedure: RIGHT AND LEFT HEART CATH;  Surgeon: Jolaine Artist, MD;  Location: [facility] [facility];  Service: Cardiovascular;  Laterality: N/A;   TEE WITHOUT CARDIOVERSION N/A 12/17/2013   Procedure: TRANSESOPHAGEAL ECHOCARDIOGRAM (TEE);  Surgeon: Pixie Casino, MD;  Location: [facility]COPY;  Service: Cardiovascular;  Laterality: N/A;  trish/ja   TRANSTHORACIC ECHOCARDIOGRAM  0/62/3762   LV systolic function normal with mild conc LVH; LA mildly  dilated; trace MR/TR   UPPER GI ENDOSCOPY  2009   negative    Current Medications: No outpatient medications have been marked as taking for the 05/31/21 encounter (Appointment) with Warren Lacy, PA-C.     Allergies:   Benadryl [diphenhydramine hcl], Clopidogrel bisulfate, Clopidogrel bisulfate, Diphenhydramine, Diphenhydramine hcl, Ioversol, Benadryl [diphenhydramine], and Iodinated diagnostic agents   Social History   Socioeconomic History   Marital status: Married    Spouse name: Not on file   Number of children: 2   Years of education: master's   Highest education level: Not on file  Occupational History   Occupation: Electrical engineer    Employer: [employer]  Tobacco Use   Smoking status: Former    Packs/day: 1.50    Years: 30.00    Pack years: 45.00    Types: Cigarettes    Quit date: 08/13/1993    Years since quitting: 27.8   Smokeless tobacco: Never  Vaping Use   Vaping Use: Never used  Substance and Sexual Activity  Alcohol use: No   Drug use: No   Sexual activity: Not on file  Other Topics Concern   Not on file  Social History Narrative   ** Merged History Encounter **       Social Determinants of Health   Financial Resource Strain: Not on file  Food Insecurity: Not on file  Transportation Needs: Not on file  Physical Activity: Not on file  Stress: Not on file  Social Connections: Not on file     Family History: The patient's family history includes Cancer in her brother and sister; Colon cancer (age of onset: 70) in her brother; Colon polyps in her sister; Emphysema (age of onset: 53) in her mother; Gout in her brother; Heart attack (age of onset: 44) in her father; Hyperlipidemia in her brother; Hypertension in her brother; Pneumonia in her maternal grandmother; Ulcers in her mother. There is no history of Diabetes or Stroke.  ROS:   Please see the history of present illness.     EKGs/Labs/Other Studies Reviewed:    EKG:  The  ekg ordered today demonstrates atrial fibrillation, heart rate 78, left posterior fascicular block.  Recent Labs: 02/20/2021: Pro B Natriuretic peptide (BNP) 163.0 02/28/2021: ALT 16 03/01/2021: Magnesium 1.7; TSH 1.920 03/07/2021: BUN 10; Creatinine, Ser 0.70; Hemoglobin 8.3; Platelets 234; Potassium 3.6; Sodium 129   Recent Lipid Panel Lab Results  Component Value Date/Time   CHOL 104 09/23/2020 04:01 PM   CHOL 188 11/17/2019 03:34 PM   TRIG 150.0 (H) 09/23/2020 04:01 PM   HDL 38.10 (L) 09/23/2020 04:01 PM   HDL 43 11/17/2019 03:34 PM   LDLCALC 36 09/23/2020 04:01 PM   LDLCALC 108 (H) 11/17/2019 03:34 PM   LDLDIRECT 143.4 01/23/2008 01:16 PM    Physical Exam:    VS:  There were no vitals taken for this visit.   No data found.  Wt Readings from Last 3 Encounters:  02/28/21 180 lb (81.6 kg)  02/20/21 182 lb 6.4 oz (82.7 kg)  01/23/21 182 lb 9.6 oz (82.8 kg)     GEN: Obese, sitting in a wheelchair, in no acute distress HEENT: Normal NECK: No JVD; No carotid bruits CARDIAC: Irregular irregular, no murmurs, rubs, gallops RESPIRATORY:  Clear to auscultation without rales, wheezing or rhonchi  ABDOMEN: Soft, non-tender, non-distended MUSCULOSKELETAL: Trace edema to knees bilaterally; No deformity  SKIN: Warm and dry NEUROLOGIC:  Alert and oriented PSYCHIATRIC:  Normal affect   ASSESSMENT AND PLAN   Preop Clearance Click Here to Calculate RCRI      :[id number]}   Ms. Valenza's perioperative risk of a major cardiac event is 6.6% according to the Revised Cardiac Risk Index (RCRI).  Therefore, she is at higher risk for perioperative complications.   Her functional capacity is fair/poor at 3.6 METs according to the Duke Activity Status Index (DASI). Recommendations: According to ACC/AHA guidelines, the patient will require stress testing for further risk stratification.  If the Lexiscan is negative for ischemia, she would be optimized from a cardiovascular standpoint to proceed with  surgery.   Antiplatelet and/or Anticoagulation bRecommendations:  Xarelto (Rivaroxaban) can be held for 2 days prior to surgery.  Please resume post op when felt to be safe.    CAD, no angina -Asymptomatic even with anemia.  She has not required any revascularization procedure since 2009, with normal nuclear stress test in 2012, 2014 and 2016. -Order Lexiscan stress test for risk stratification. -Continue statin and beta-blocker therapy.  Atrial fibrillation, rate controlled. -dilated atria on  echo -Continue rate control with a previous history of tachycardic cardiomyopathy -Continue Xarelto for stroke prevention.  Okay to hold 2 days preop. -Dr. Sallyanne Kuster talked her about watchman device at her last visit -refer to Dr. Quentin Ore. -Check CBC today with intermittent blood streaking on toilet paper.  Chronic diastolic heart failure, euvolemic today -2D echo 2021 with EF 50 to 55% -Continue SGLT2 inhibitor.  Continue torsemide compression stockings. -We will check BMET today with history of hyponatremia.  Hypertension, well controlled -Continue current medications. -Goal BP is <130/80.  Recommend DASH diet (high in vegetables, fruits, low-fat dairy products, whole grains, poultry, fish, and nuts and low in sweets, sugar-sweetened beverages, and red meats), salt restriction and increase physical activity.  Hyperlipidemia, LDL less than 70  -Continue statin. -Discussed cholesterol lowering diets - Mediterranean diet, DASH diet, vegetarian diet, low-carbohydrate diet and avoidance of trans fats.  Discussed healthier choice substitutes.  Nuts, high-fiber foods, and fiber supplements may also improve lipids.    Obesity -Discussed how even a 5-10% weight loss can have cardiovascular benefits.   -Recommend moderate intensity activity for 30 minutes 5 days/week and the DASH diet.  Disposition - Follow-up in December with Dr. Sallyanne Kuster as already scheduled.    Shared Decision Making/Informed  Consent The risks [chest pain, shortness of breath, cardiac arrhythmias, dizziness, blood pressure fluctuations, myocardial infarction, stroke/transient ischemic attack, nausea, vomiting, allergic reaction, radiation exposure, metallic taste sensation and life-threatening complications (estimated to be 1 in 10,000)], benefits (risk stratification, diagnosing coronary artery disease, treatment guidance) and alternatives of a nuclear stress test were discussed in detail with Ms. Sherk and she agrees to proceed.    Medication Adjustments/Labs and Tests Ordered: Current medicines are reviewed at length with the patient today.  Concerns regarding medicines are outlined above.  No orders of the defined types were placed in this encounter.  No orders of the defined types were placed in this encounter.   There are no Patient Instructions on file for this visit.   Signed, Warren Lacy, PA-C  05/30/2021 9:38 PM     [facility]

## 2021-05-30 NOTE — Telephone Encounter (Signed)
Fyi.

## 2021-05-30 NOTE — Telephone Encounter (Signed)
   Name: BRYANNE RIQUELME  DOB: Jan 30, 1951  MRN: [id number]  Primary Cardiologist: Sanda Klein, MD  Chart reviewed as part of pre-operative protocol coverage. Because of Telia Amundson Stebbins's past medical history and time since last visit, she will require a follow-up visit in order to better assess preoperative cardiovascular risk.  Pre-op covering staff: - Please schedule appointment and call patient to inform them. If patient already had an upcoming appointment within acceptable timeframe, please add "pre-op clearance" to the appointment notes so provider is aware. - Please contact requesting surgeon's office via preferred method (i.e, phone, fax) to inform them of need for appointment prior to surgery.  If applicable, this message will also be routed to pharmacy pool and/or primary cardiologist for input on holding anticoagulant/antiplatelet agent as requested below so that this information is available to the clearing provider at time of patient's appointment.   [place], [place]  05/30/2021, 1:15 PM

## 2021-05-31 ENCOUNTER — Encounter: Payer: Self-pay | Admitting: Internal Medicine

## 2021-05-31 ENCOUNTER — Encounter: Payer: Self-pay | Admitting: Physician Assistant

## 2021-05-31 ENCOUNTER — Ambulatory Visit: Payer: HMO | Admitting: Physician Assistant

## 2021-05-31 ENCOUNTER — Other Ambulatory Visit: Payer: Self-pay

## 2021-05-31 ENCOUNTER — Telehealth: Payer: Self-pay

## 2021-05-31 VITALS — BP 122/64 | HR 78 | Ht 62.0 in | Wt 166.8 lb

## 2021-05-31 DIAGNOSIS — Z01818 Encounter for other preprocedural examination: Secondary | ICD-10-CM | POA: Diagnosis not present

## 2021-05-31 DIAGNOSIS — E785 Hyperlipidemia, unspecified: Secondary | ICD-10-CM | POA: Diagnosis not present

## 2021-05-31 DIAGNOSIS — I5032 Chronic diastolic (congestive) heart failure: Secondary | ICD-10-CM

## 2021-05-31 DIAGNOSIS — E1169 Type 2 diabetes mellitus with other specified complication: Secondary | ICD-10-CM

## 2021-05-31 DIAGNOSIS — Z7901 Long term (current) use of anticoagulants: Secondary | ICD-10-CM | POA: Diagnosis not present

## 2021-05-31 DIAGNOSIS — E875 Hyperkalemia: Secondary | ICD-10-CM

## 2021-05-31 DIAGNOSIS — I4821 Permanent atrial fibrillation: Secondary | ICD-10-CM | POA: Diagnosis not present

## 2021-05-31 DIAGNOSIS — I10 Essential (primary) hypertension: Secondary | ICD-10-CM

## 2021-05-31 DIAGNOSIS — I2510 Atherosclerotic heart disease of native coronary artery without angina pectoris: Secondary | ICD-10-CM

## 2021-05-31 NOTE — Patient Instructions (Signed)
Medication Instructions:  No Changes *If you need a refill on your cardiac medications before your next appointment, please call your pharmacy*   Lab Work: BMET, CBC If you have labs (blood work) drawn today and your tests are completely normal, you will receive your results only by: [place] (if you have MyChart) OR A paper copy in the mail If you have any lab test that is abnormal or we need to change your treatment, we will call you to review the results.   Testing/Procedures: [address], [place] has requested that you have a Lexicographer. For further information please visit [email or web address]. Please follow instruction sheet, as given.    Follow-Up: At [facility], you and your health needs are our priority.  As part of our continuing mission to provide you with exceptional heart care, we have created designated Provider Care Teams.  These Care Teams include your primary Cardiologist (physician) and Advanced Practice Providers (APPs -  Physician Assistants and Nurse Practitioners) who all work together to provide you with the care you need, when you need it.  We recommend signing up for the patient portal called "MyChart".  Sign up information is provided on this After Visit Summary.  MyChart is used to connect with patients for Virtual Visits (Telemedicine).  Patients are able to view lab/test results, encounter notes, upcoming appointments, etc.  Non-urgent messages can be sent to your provider as well.   To learn more about what you can do with MyChart, go to [email or web address].    Your next appointment:   July 26, 2021 9:40 AM  The format for your next appointment:   In Person  Provider:   Sanda Klein, MD

## 2021-05-31 NOTE — Telephone Encounter (Signed)
[facility] crital result called in Potassium 6.0 will route to jennifer lambert pa-c.

## 2021-06-01 ENCOUNTER — Other Ambulatory Visit: Payer: Self-pay | Admitting: Physician Assistant

## 2021-06-01 DIAGNOSIS — E875 Hyperkalemia: Secondary | ICD-10-CM

## 2021-06-01 LAB — CBC
Hematocrit: 29.1 % — ABNORMAL LOW (ref 34.0–46.6)
Hemoglobin: 9 g/dL — ABNORMAL LOW (ref 11.1–15.9)
MCH: 24.9 pg — ABNORMAL LOW (ref 26.6–33.0)
MCHC: 30.9 g/dL — ABNORMAL LOW (ref 31.5–35.7)
MCV: 81 fL (ref 79–97)
Platelets: 242 10*3/uL (ref 150–450)
RBC: 3.61 x10E6/uL — ABNORMAL LOW (ref 3.77–5.28)
RDW: 14.4 % (ref 11.7–15.4)
WBC: 7.1 10*3/uL (ref 3.4–10.8)

## 2021-06-01 LAB — BASIC METABOLIC PANEL
BUN/Creatinine Ratio: 18 (ref 12–28)
BUN: 18 mg/dL (ref 8–27)
CO2: 22 mmol/L (ref 20–29)
Calcium: 9.5 mg/dL (ref 8.7–10.3)
Chloride: 95 mmol/L — ABNORMAL LOW (ref 96–106)
Creatinine, Ser: 0.99 mg/dL (ref 0.57–1.00)
Glucose: 97 mg/dL (ref 70–99)
Potassium: 6 mmol/L (ref 3.5–5.2)
Sodium: 129 mmol/L — ABNORMAL LOW (ref 134–144)
eGFR: 61 mL/min/{1.73_m2} (ref >59)

## 2021-06-01 NOTE — Telephone Encounter (Signed)
Called and spoke w/pt's spouse and stated to stop potassium supplement and to recheck on Monday and they voiced understanding

## 2021-06-05 ENCOUNTER — Other Ambulatory Visit: Payer: Self-pay

## 2021-06-05 DIAGNOSIS — E875 Hyperkalemia: Secondary | ICD-10-CM | POA: Diagnosis not present

## 2021-06-06 ENCOUNTER — Telehealth (HOSPITAL_COMMUNITY): Payer: Self-pay | Admitting: *Deleted

## 2021-06-06 ENCOUNTER — Telehealth: Payer: Self-pay

## 2021-06-06 ENCOUNTER — Encounter (HOSPITAL_COMMUNITY): Payer: Self-pay | Admitting: *Deleted

## 2021-06-06 LAB — BASIC METABOLIC PANEL
BUN/Creatinine Ratio: 17 (ref 12–28)
BUN: 17 mg/dL (ref 8–27)
CO2: 22 mmol/L (ref 20–29)
Calcium: 9.2 mg/dL (ref 8.7–10.3)
Chloride: 97 mmol/L (ref 96–106)
Creatinine, Ser: 0.98 mg/dL (ref 0.57–1.00)
Glucose: 124 mg/dL — ABNORMAL HIGH (ref 70–99)
Potassium: 4.9 mmol/L (ref 3.5–5.2)
Sodium: 131 mmol/L — ABNORMAL LOW (ref 134–144)
eGFR: 61 mL/min/{1.73_m2} (ref >59)

## 2021-06-06 NOTE — Telephone Encounter (Signed)
Per Melvenia Needles and Dr. Sallyanne Kuster, called to schedule patient for [facility] consult. Her wife (DPR) was annoyed they had been called. She stated repeatedly that Watchman is not their priority and they have to get through hip surgery first. She stated she "had told everyone that." Apologized for inconvenience. It was agreed that they will not be called back, they will call if they'd like to schedule consult in the future.

## 2021-06-06 NOTE — Telephone Encounter (Signed)
MyChart letter sent outlining the instructions for upcoming stress test.

## 2021-06-07 ENCOUNTER — Telehealth: Payer: Self-pay

## 2021-06-07 ENCOUNTER — Other Ambulatory Visit: Payer: Self-pay

## 2021-06-07 ENCOUNTER — Ambulatory Visit
Admission: RE | Admit: 2021-06-07 | Discharge: 2021-06-07 | Disposition: A | Discharge status: Home / Self Care | Payer: HMO | Source: Ambulatory Visit | Attending: Orthopedic Surgery | Admitting: Orthopedic Surgery

## 2021-06-07 ENCOUNTER — Telehealth: Payer: Self-pay | Admitting: Internal Medicine

## 2021-06-07 DIAGNOSIS — S32422A Displaced fracture of posterior wall of left acetabulum, initial encounter for closed fracture: Secondary | ICD-10-CM | POA: Diagnosis not present

## 2021-06-07 DIAGNOSIS — M1612 Unilateral primary osteoarthritis, left hip: Secondary | ICD-10-CM | POA: Diagnosis not present

## 2021-06-07 DIAGNOSIS — Z79899 Other long term (current) drug therapy: Secondary | ICD-10-CM

## 2021-06-07 DIAGNOSIS — M25452 Effusion, left hip: Secondary | ICD-10-CM | POA: Diagnosis not present

## 2021-06-07 DIAGNOSIS — M25552 Pain in left hip: Secondary | ICD-10-CM

## 2021-06-07 NOTE — Telephone Encounter (Signed)
Fax received from Dr. Frederik Pear to perform a Left anterior hip arthroplasty under spinal anesthesia on patient.  Patient needs surgery clearance. Patient was last seen 12/26/2020 and is scheduled for OV on 06/26/2021. Office protocol is a risk assessment can be sent to surgeon if patient has been seen in 60 days or less.   Sending to Dr. Annamaria Boots for risk assessment once patient has been seen on 06/26/2021

## 2021-06-07 NOTE — Telephone Encounter (Addendum)
Spoke with patient regarding lab results. Advised patient to restart quinapril 20 mg daily and repeat blood test in 1 week.----- Message from Warren Lacy, PA-C sent at 06/06/2021  3:42 PM EDT ----- Your potassium is now back to normal.  This is excellent.  We can start back quinapril at half dose (20mg  daily) & recheck bloodwork (BMET) in 1 week.   [name] -let's set her up for BMET in 1 week.  Do not take any more potassium supplements.

## 2021-06-08 ENCOUNTER — Telehealth: Payer: Self-pay | Admitting: Internal Medicine

## 2021-06-08 NOTE — Telephone Encounter (Signed)
Patient calling to state HYDROcodone-acetaminophen (NORCO/VICODIN) 5-325 MG tablet is not helping w/ pain  Patient states she was advised in 04-2021 to double the medication and if medication did not help that strength could be increased  Patient is requesting increase mg of HYDROcodone-acetaminophen (NORCO/VICODIN)   Pharmacy [place], [place]

## 2021-06-09 ENCOUNTER — Telehealth (HOSPITAL_COMMUNITY): Payer: Self-pay | Admitting: Radiology

## 2021-06-09 ENCOUNTER — Encounter: Payer: Self-pay | Admitting: Internal Medicine

## 2021-06-09 NOTE — Telephone Encounter (Signed)
Patient given detailed instructions per Myocardial Perfusion Study Information Sheet for the test on 10/31/ at 10:45. Patient notified to arrive 15 minutes early and that it is imperative to arrive on time for appointment to keep from having the test rescheduled.  If you need to cancel or reschedule your appointment, please call the office within 24 hours of your appointment. . Patient verbalized understanding.EHK

## 2021-06-09 NOTE — Telephone Encounter (Signed)
See below

## 2021-06-09 NOTE — Telephone Encounter (Signed)
See my chart message

## 2021-06-09 NOTE — Telephone Encounter (Signed)
She would need visit for change.

## 2021-06-12 ENCOUNTER — Ambulatory Visit (HOSPITAL_COMMUNITY): Payer: HMO | Attending: Cardiovascular Disease

## 2021-06-12 ENCOUNTER — Other Ambulatory Visit (HOSPITAL_COMMUNITY): Payer: Self-pay | Admitting: Cardiovascular Disease

## 2021-06-12 ENCOUNTER — Other Ambulatory Visit: Payer: Self-pay

## 2021-06-12 ENCOUNTER — Encounter (HOSPITAL_COMMUNITY): Payer: Self-pay

## 2021-06-12 ENCOUNTER — Ambulatory Visit (HOSPITAL_COMMUNITY): Payer: HMO

## 2021-06-12 VITALS — Ht 62.0 in | Wt 166.0 lb

## 2021-06-12 DIAGNOSIS — I2510 Atherosclerotic heart disease of native coronary artery without angina pectoris: Secondary | ICD-10-CM | POA: Insufficient documentation

## 2021-06-12 DIAGNOSIS — Z01810 Encounter for preprocedural cardiovascular examination: Secondary | ICD-10-CM | POA: Diagnosis not present

## 2021-06-12 DIAGNOSIS — Z01818 Encounter for other preprocedural examination: Secondary | ICD-10-CM | POA: Insufficient documentation

## 2021-06-12 DIAGNOSIS — R0789 Other chest pain: Secondary | ICD-10-CM | POA: Diagnosis not present

## 2021-06-12 LAB — MYOCARDIAL PERFUSION IMAGING
Base ST Depression (mm): 0 mm
LV dias vol: 66 mL (ref 46–106)
LV sys vol: 33 mL
Nuc Stress EF: 51 %
Peak HR: 81 {beats}/min
Rest HR: 69 {beats}/min
Rest Nuclear Isotope Dose: 10.2 mCi
SDS: 1
SRS: 0
SSS: 1
ST Depression (mm): 0 mm
Stress Nuclear Isotope Dose: 31.2 mCi
TID: 0.97

## 2021-06-12 MED ORDER — REGADENOSON 0.4 MG/5ML IV SOLN
0.4000 mg | Freq: Once | INTRAVENOUS | Status: AC
  Administered 2021-06-12: 0.4 mg via INTRAVENOUS

## 2021-06-12 MED ORDER — TECHNETIUM TC 99M TETROFOSMIN IV KIT
31.7000 | PACK | Freq: Once | INTRAVENOUS | Status: AC | PRN
  Administered 2021-06-12: 31.7 via INTRAVENOUS
  Filled 2021-06-12: qty 32

## 2021-06-12 MED ORDER — TECHNETIUM TC 99M TETROFOSMIN IV KIT
10.9000 | PACK | Freq: Once | INTRAVENOUS | Status: AC | PRN
  Administered 2021-06-12: 10.9 via INTRAVENOUS
  Filled 2021-06-12: qty 11

## 2021-06-13 DIAGNOSIS — M25552 Pain in left hip: Secondary | ICD-10-CM | POA: Diagnosis not present

## 2021-06-13 NOTE — Telephone Encounter (Signed)
[name] is calling requesting this clearance due to the Lexiscan results coming back yesterday as low risk. She is requesting a callback to know the status of this clearance due to the surgery not being scheduled until she is clear.

## 2021-06-14 DIAGNOSIS — Z8719 Personal history of other diseases of the digestive system: Secondary | ICD-10-CM | POA: Diagnosis not present

## 2021-06-14 DIAGNOSIS — D509 Iron deficiency anemia, unspecified: Secondary | ICD-10-CM | POA: Diagnosis not present

## 2021-06-14 NOTE — Telephone Encounter (Signed)
Patient with diagnosis of atrial fibrillation on Xarelto for anticoagulation.    Procedure: left hip replacement Date of procedure: pending   CHA2DS2-VASc Score = 6   This indicates a 9.7% annual risk of stroke. The patient's score is based upon: CHF History: 1 HTN History: 1 Diabetes History: 1 Stroke History: 0 Vascular Disease History: 1 Age Score: 1 Gender Score: 1    CrCl 52 (with adjusted body weight) Platelet count 242  Per office protocol, patient can hold Xarelto for 3 days prior to procedure.   Patient will not need bridging with Lovenox (enoxaparin) around procedure.  For orthopedic procedures please be sure to resume therapeutic (not prophylactic) dosing.

## 2021-06-15 ENCOUNTER — Ambulatory Visit: Payer: HMO | Admitting: Neurology

## 2021-06-15 ENCOUNTER — Encounter: Payer: Self-pay | Admitting: Neurology

## 2021-06-15 VITALS — BP 125/55 | HR 77 | Ht 62.0 in | Wt 172.0 lb

## 2021-06-15 DIAGNOSIS — D649 Anemia, unspecified: Secondary | ICD-10-CM | POA: Diagnosis not present

## 2021-06-15 DIAGNOSIS — G2581 Restless legs syndrome: Secondary | ICD-10-CM

## 2021-06-15 DIAGNOSIS — I82462 Acute embolism and thrombosis of left calf muscular vein: Secondary | ICD-10-CM

## 2021-06-15 DIAGNOSIS — I25119 Atherosclerotic heart disease of native coronary artery with unspecified angina pectoris: Secondary | ICD-10-CM | POA: Diagnosis not present

## 2021-06-15 DIAGNOSIS — I4811 Longstanding persistent atrial fibrillation: Secondary | ICD-10-CM

## 2021-06-15 MED ORDER — TRAZODONE HCL 100 MG PO TABS: ORAL_TABLET | ORAL | 3 refills | Status: DC

## 2021-06-15 MED ORDER — PAROXETINE HCL 10 MG PO TABS: 25.0000 mg | ORAL_TABLET | ORAL | 0 refills | Status: DC

## 2021-06-15 NOTE — Patient Instructions (Signed)
Restless Legs Syndrome Restless legs syndrome is a condition that causes uncomfortable feelings or sensations in the legs, especially while sitting or lying down. The sensations usually cause an overwhelming urge to move the legs. The arms can alsosometimes be affected. The condition can range from mild to severe. The symptoms often interfere witha person's ability to sleep. What are the causes? The cause of this condition is not known. What increases the risk? The following factors may make you more likely to develop this condition: Being older than 50. Pregnancy. Being a woman. In general, the condition is more common in women than in men. A family history of the condition. Having iron deficiency. Overuse of caffeine, nicotine, or alcohol. Certain medical conditions, such as kidney disease, Parkinson's disease, or nerve damage. Certain medicines, such as those for high blood pressure, nausea, colds, allergies, depression, and some heart conditions. What are the signs or symptoms? The main symptom of this condition is uncomfortable sensations in the legs, such as: Pulling. Tingling. Prickling. Throbbing. Crawling. Burning. Usually, the sensations: Affect both sides of the body. Are worse when you sit or lie down. Are worse at night. These may wake you up or make it difficult to fall asleep. Make you have a strong urge to move your legs. Are temporarily relieved by moving your legs. The arms can also be affected, but this is rare. People who have this conditionoften have tiredness during the day because of their lack of sleep at night. How is this diagnosed? This condition may be diagnosed based on: Your symptoms. Blood tests. In some cases, you may be monitored in a sleep lab by a specialist (a sleep study). This can detect any disruptions in your sleep. How is this treated? This condition is treated by managing the symptoms. This may include: Lifestyle changes, such as  exercising, using relaxation techniques, and avoiding caffeine, alcohol, or tobacco. Medicines. Anti-seizure medicines may be tried first. Follow these instructions at home: General instructions Take over-the-counter and prescription medicines only as told by your health care provider. Use methods to help relieve the uncomfortable sensations, such as: Massaging your legs. Walking or stretching. Taking a cold or hot bath. Keep all follow-up visits as told by your health care provider. This is important. Lifestyle     Practice good sleep habits. For example, go to bed and get up at the same time every day. Most adults should get 7-9 hours of sleep each night. Exercise regularly. Try to get at least 30 minutes of exercise most days of the week. Practice ways of relaxing, such as yoga or meditation. Avoid caffeine and alcohol. Do not use any products that contain nicotine or tobacco, such as cigarettes and e-cigarettes. If you need help quitting, ask your health care provider. Contact a health care provider if: Your symptoms get worse or they do not improve with treatment. Summary Restless legs syndrome is a condition that causes uncomfortable feelings or sensations in the legs, especially while sitting or lying down. The symptoms often interfere with a person's ability to sleep. This condition is treated by managing the symptoms. You may need to make lifestyle changes or take medicines. This information is not intended to replace advice given to you by your health care provider. Make sure you discuss any questions you have with your healthcare provider. Document Revised: 09/18/2019 Document Reviewed: 08/19/2017 [employer] Patient Education  2022 [employer]  

## 2021-06-15 NOTE — Addendum Note (Signed)
Addended by: Larey Seat on: 06/15/2021 04:27 PM   Modules accepted: Orders

## 2021-06-15 NOTE — Progress Notes (Addendum)
SLEEP MED[facility]INE CLINIC   Provider:  Larey Seat, MD  Referring Provider: Hoyt Koch, * Primary Care Physician:  Hoyt Koch, MD  Chief Complaint  Patient presents with   Follow-up    Rm 10, alone. Pt here for 5 month RLS f/u. Pt repots fracturing her hip on July 19 and will be having surgery soon. Ambulates in WC. Pt reports the motions of RLS effects her hip.    [name] 06-15-2021: [name] with [name], 70 year old female patient with recent hospitalization.  The patient had a fall in July 2022, a months o after last visit me . which fractured right hip and shoulder, went to [facility] and from there to Rehab, but outpatient at home. She feels her PT pushed her too hard.  She suffered a new injury in PT , now presents in a wheelchair is in pain.  She can't sleep flat anymore, she has a new chair, recliner . She feels bothered by her RLS, stretching , moving causes pain but she cannot suppress the urge. She has calf pain on the left shin pain and her skin looks tight,     01-12-2021: [name] : 41 -year-old Caucasian and Native American patient presents for yearly medication management f/u. Overall stable.  She had a severe decline by christmas- 2021- until March 2022. Pt states that with the RLS the 2 mg ER was denied by insurance. She has continued taking the 0.5 mg tablet and then additional one if needed (was never ordered the 1 mg dose to take between dinner and bedtime). She has noted dizziness and sleepiness that are possible side effects to medications, thankfully has not not fallen. Chronic respiratory failure. CHF. She has a history of pericardial effusion, had anemia- had transfusion, 3-31-2022_ferritin was 10.2 .  H and H was 10.4 g/ 32% - but walking was limited by swelling in feet and legs. Rectal bleeding. She is very easily SOB and her hips give out- has been limited in her walking ability. Dr Ellene Route sent her to PT for gait. Vit B 12 is low. GFR is low. Hyponatremia is  present.  FSS 51 points, Epworth at 12/ 24 years, 4/ 15 depression scale.  Paxil may contribute to RLS and I would like to change to 10 mg for 7 days, then start Prozac. She will wait until dr Sharlet Salina speaks to her.   11-02-2019; [name] is a 70 year old Caucasian patient of Dr. Pricilla Holm, MD.  She presents today after a 80-monthhiatus.  The last time I saw her she had pericardial effusions, developed headaches dizziness and had fallen with a large hematoma to the right temple.  She states now that she has not  continued to-  fall to  fall out of bed. As she reports that there was a flurry of falls out of bed in June, July and August 2020.  She has a history of chronic atrial fibrillation.  She has regained fluid weight is abdominal distention.  She has a remote history of smoking over 25 years ago she quit.  She is tachycardic here today as she checked in and her blood pressure was elevated 152/90, pulse rate was 124.  She has been responding well to the treatment of restless legs with medication.  She endorsed the Epworth Sleepiness Scale at 6 out of 24 points which is a good response, she reports a high degree of fatigue. She is trying very day to walk a little more  distance.  She will see Dr. Benn Moulder next month, blood work is next week.    03-05-2019,  [name] is a  70 year old caucasian left handed patient. Presenting for urgent Visit.  Interval history for anew concern of confusion, delayed word retrieval following a fall out of bed, head injury.  She had a headache , a little nausea and about a day later she developed headaches, dizziness and a large hematoma on the right temple.   There has been a progression in her heart failure.  Her interval history is quite remarkable patient informed me in a MyChart message that she had been admitted to [facility] on May 31 after developing increasing shortness of breath and extreme weakness.  She was found to have pulmonary  edema, atrial fibrillation and pericardial effusion was evaluated for underlying malignancy.  She had almost 2 L of fluid drained from the pericardium and lives she stated.  She was in the hospital for belly a week about the third week after she returned home she had a fall out of bed.  The CT of the chest abdomen and pelvis was performed without contrast documented small amount of ascites primarily in the pelvis bilateral flank edema anasarca with bilateral pleural effusions, ascites and bilateral flank edema.  A previous CT had already described a similar small nodule to the left adrenal gland this has not changed and is likely incidental.  The previous CT was from 2003.  CT of the chest showed that the heart was mildly enlarged.there was still a small residual pericardial effusion and the mediastinum did not show enlarged lymph nodes there were pleural effusions small left more than right atelectasis in both lower lobes.  No infiltration, no pancreatic abnormality noted normal spleen, normal bowel anatomy.  The studies are from 14 January 2019. The discharge summary dated 16 January 2019 stated that the patient would be on fluid restriction she was discharged on torsemide, on Cipro, she will be followed for elevated liver function tests-transaminases, and her work-up for anemia will be continued.  At the time there was no neurologic concerns this arose from the fall 3 weeks later.     HPI: 06-10-2017, I had the opportunity of meeting [name] on February 28 last year, about 20 months ago. In the meantime she would like to be further evaluated a past medical history has not significantly changed. Her chief complaints of neither. She continues to be treated on Xarelto for atrial fibrillation with rapid ventricular response, takes Cardizem for rate control, gabapentin, estradiol, Invokana, Symbicort inhaler, Janumet, she still taking Paxil, but she is no longer taking Ultram.  She is still in need of a formal  sleep evaluation, her atrial fibrillation has been difficult to control. She has failed 4 cardio-versions. Could not take Tikosyn as it interferes with her paxil.  insisted on continuing Paxil, out of fear to have panic attacks again. 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 She lives with her wife, has a supportive sister. She adopted 2 children.      HPI:  MILAYA HORA is a 70 y.o. female , seen here as a referral from Dr. Sharlet Salina for a sleep consultation,   Chief complaint according to patient : " insomnia- difficulties to go to sleep , but I can stay asleep" . 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  Mrs. Kowalczyk is a patient with atrial fibrillation and sleep initiation insomnia, she also had a Bell's  palsy in 2001, a cholecystectomy in 2012 cardiac stenting in 2009 and a hysterectomy in 1982. Her past medical history includes hypertension, diabetes, hypercholesterolemia, heart disease atrial fibrillation, anxiety and migraine. In her review of systems she endorsed further easy bruising, shortness of breath, chest pain and palpitation being aware of a cardiac murmur, restless legs and headaches. It was upon further recommendation of her cardiologist at the atrial fibrillation clinic at [facility] where she was recommended to undergo a sleep study. The patient was educated about the correlation between atrial fibrillation and obstructive sleep apnea. She sees Dr. Quay Burow. She has a panic disorder and takes Ativan.   Paroxysmal atrial fibrillation was first diagnosed in May 2015 with a successful conversion 2 first of May of the same year and after that she relapsed into a fib again in September of the same year. She then suffered in early 2017 recurrent episode and her cardiologist increased her metoprolol 250 minute times a day to control the rate. She underwent another cardioversion in feb 2017 ,  converted on the dose and has been maintaining sinus rhythm  for one week.  Sleep habits are as follows: She takes Lorazepam at night and melatonin. She sleeps on her right side. She wakes up prone. Bedroom is cold, quiet and dark.  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 Working 2- 8 PM, late dinner, reading. And going to bed after midnight after some " me time" . She sleeps 7-8 hours and often rises at noon. She sleeps with her spouse, she reportedly kicks her wife a lot.  She often wakes with palpitations and noted a very high blood pressure. She has woken diaphoretic.  Her spouse wakes up "a zillion times" but can go to sleep. Delayed sleep cycle for years . On weekends she sleeps 10-12 hours , 4 AM  until 2 PM. She does not nap in daytime. She does drink coffee after waking up and after work (!).  She wakes with a dry mouth and no headaches.  Sleep medical history and family sleep history: her sister has hypersomnia. Social history:  Married to same sex partner,  2 daughters 52 and 62. No ETOH, No tobacco, little  caffeine.    01-22-2018, I have the pleasure of seeing Mrs. Judeen Hammans and Miles today patient I had last encounter prior to scheduling a sleep study for her.  She has a history of migraine, persistent atrial fibrillation, coronary artery disease with cardiac stents, obesity, diabetes and hypertension.  She was referred by cardiologist Dr. Alvester Chou and primary care physician Pricilla Holm at the time.  Her sleep study revealed no significant apnea, mild snoring moderate snoring when supine, persistent atrial fibrillation, and severe periodic limb movements with related arousals at 14.6 times per hour of sleep.  I felt strongly that her PLM's are the main culprit also.  Periodic limb movements can be related to medication side effects but my main suspicion was that we need to check for her spinal anatomy, any neuropathy or any iron deficiency.  A ferritin level had to be obtained which was reportedly  very low  and the patient was finally treated for iron deficiency and anemia.  In the meantime before the iron deficiency was evident she had seen Dr. Lynann Bologna,  who followed up with her recently  April 2019 and stated that she had no explanation for the progressive balance loss and pain in the extremities and could not  correlate this quite to the mild-spinal stenosis as found between L5 and S1.   The patient states that after she was transfused with 2 units her symptoms improved drastically but now are slowly coming back.  She is scheduled for an upper and lower GI endoscopy tomorrow.  This is meant to discover were blood may be lost. There is blood is in her stool. Dr Wallis Mart had seen her 3 years ago and warned about Gi Bleed. Iron deficiency caused severe RLS, and now she can sit still for a while.  On Requip.    Review of Systems: Out of a complete 14 system review, the patient complains of only the following symptoms, and all other reviewed systems are negative.  RLS, sleeps only 5-7 hours, and later in daytime.  She l has not slept some night.  FSS  29 down from 61, now up to 48 again Geriatric depression score 2   How likely are you to doze in the following situations: 0 = not likely, 1 = slight chance, 2 = moderate chance, 3 = high chance  Sitting and Reading? Watching Television? Sitting inactive in a public place (theater or meeting)? Lying down in the afternoon when circumstances permit? Sitting and talking to someone? Sitting quietly after lunch without alcohol? In a car, while stopped for a few minutes in traffic? As a passenger in a car for an hour without a break?  Total = 15  points.  In pain , anemia, RLS , poor sleep qulaity,     Social History   Socioeconomic History   Marital status: Married    Spouse name: Not on file   Number of children: 2   Years of education: master's   Highest education level: Not on file  Occupational History   Occupation: Electrical engineer     Employer: [employer]  Tobacco Use   Smoking status: Former    Packs/day: 1.50    Years: 30.00    Pack years: 45.00    Types: Cigarettes    Quit date: 08/13/1993    Years since quitting: 27.8   Smokeless tobacco: Never  Vaping Use   Vaping Use: Never used  Substance and Sexual Activity   Alcohol use: No   Drug use: No   Sexual activity: Not on file  Other Topics Concern   Not on file  Social History Narrative   ** Merged History Encounter **       Social Determinants of Health   Financial Resource Strain: Not on file  Food Insecurity: Not on file  Transportation Needs: Not on file  Physical Activity: Not on file  Stress: Not on file  Social Connections: Not on file  Intimate Partner Violence: Not on file    Family History  Problem Relation Age of Onset   Heart attack Father 74       2nd MI at 90   Colon cancer Brother 16   Gout Brother    Ulcers Mother    Emphysema Mother 49   Colon polyps Sister    Cancer Sister        Basal cell carcinoma   Pneumonia Maternal Grandmother    Hypertension Brother    Hyperlipidemia Brother    Cancer Brother        Skin   Diabetes Neg Hx    Stroke Neg Hx     Past Medical History:  Diagnosis Date   Anemia, unspecified    Arthritis    Asthma  Atrial fibrillation with RVR (HCC)    a. on Xarelto   Bell's palsy    Facial nerve decompression in 2001   CHF (congestive heart failure) (HCC)    Chronic low back pain    COPD with asthma ([place])    Coronary artery disease    Myoview 04/12/11 was entirely normal. ECHO 02/26/08 showed only minor abnormalities. Stenting 05/26/08 of her posterolateral branch to the left circumflex coronary artery. Used a 2.5x60m Taxus Monorail stent.myoview 2014 was without ischemia   Diabetes mellitus    Type 2   Early cataracts, bilateral    Fatty liver    GERD (gastroesophageal reflux disease)    Glaucoma    Glaucoma    Goiter    Heart murmur    History of nuclear stress test 2012;  2014   lexiscan; normal pattern of perfusion; normal, low risk scan    Hyperlipidemia    Hypertension    Panic disorder    Pneumonia 2008   Polycystic ovary disease    Hysterectomy in 1982 for this   Shortness of breath dyspnea    ECHO 02/26/08 showed only minor abnormalities   Spinal stenosis     Past Surgical History:  Procedure Laterality Date   ABDOMINAL HYSTERECTOMY  1982   & BSO; for polycystic ovary disease   CARDIOVERSION N/A 12/17/2013   Procedure: CARDIOVERSION;  Surgeon: KPixie Casino MD;  [place] [facility]  Service: Cardiovascular;  Laterality: N/A;   CARDIOVERSION N/A 09/30/2015   Procedure: CARDIOVERSION;  Surgeon: KDorothy Spark MD;  [place] [facility]  Service: Cardiovascular;  Laterality: N/A;   CARDIOVERSION N/A 06/22/2016   Procedure: CARDIOVERSION;  Surgeon: TSkeet Latch MD;  [place] [facility]  Service: Cardiovascular;  Laterality: N/A;   CENTRAL LINE INSERTION  01/13/2019   Procedure: CENTRAL LINE INSERTION;  Surgeon: BJolaine Artist MD;  [facility];  Service: Cardiovascular;;   COLONOSCOPY     last 2009; Dr BCristina Gong due 2019   COLONOSCOPY WITH PROPOFOL N/A 01/23/2018   Procedure: COLONOSCOPY WITH PROPOFOL;  Surgeon: BRonald Lobo MD;  [place] [facility]  Service: Endoscopy;  Laterality: N/A;   COLONOSCOPY WITH PROPOFOL N/A 11/04/2020   Procedure: COLONOSCOPY WITH PROPOFOL;  Surgeon: BRonald Lobo MD;  [place] [facility]  Service: Endoscopy;  Laterality: N/A;   CORONARY ANGIOPLASTY  05/26/2008   Stenting of her posterolateral branch to the left circumflex coronary artery. Used a 2.5x159mTaxus Monorail stent.   ESOPHAGOGASTRODUODENOSCOPY (EGD) WITH PROPOFOL N/A 01/23/2018   Procedure: ESOPHAGOGASTRODUODENOSCOPY (EGD) WITH PROPOFOL;  Surgeon: BuRonald LoboMD;  [place] [facility] Service: Endoscopy;  Laterality: N/A;   ESOPHAGOGASTRODUODENOSCOPY (EGD) WITH PROPOFOL N/A 11/04/2020   Procedure:  ESOPHAGOGASTRODUODENOSCOPY (EGD) WITH PROPOFOL;  Surgeon: BuRonald LoboMD;  [place] [facility] Service: Endoscopy;  Laterality: N/A;   FACIAL NERVE DECOMPRESSION  2001/2002   bells palsy    GIVENS CAPSULE STUDY N/A 11/04/2020   Procedure: GIVENS CAPSULE STUDY;  Surgeon: BuRonald LoboMD;  [place] [facility] Service: Endoscopy;  Laterality: N/A;   HOT HEMOSTASIS N/A 11/04/2020   Procedure: HOT HEMOSTASIS (ARGON PLASMA COAGULATION/BICAP);  Surgeon: BuRonald LoboMD;  [facility];  Service: Endoscopy;  Laterality: N/A;   LAPAROSCOPIC CHOLECYSTECTOMY  06/15/2011    Dr InDalbert Batman PERICARDIOCENTESIS N/A 01/13/2019   Procedure: PERICARDIOCENTESIS;  Surgeon: McBurnell BlanksMD;  [facility];  Service: Cardiovascular;  Laterality: N/A;   POLYPECTOMY  11/04/2020   Procedure: POLYPECTOMY;  Surgeon: Ronald Lobo, MD;  Location: [place];  Service: Endoscopy;;   RIGHT AND LEFT HEART CATH N/A 01/13/2019   Procedure: RIGHT AND LEFT HEART CATH;  Surgeon: Jolaine Artist, MD;  Location: [place] [facility];  Service: Cardiovascular;  Laterality: N/A;   TEE WITHOUT CARDIOVERSION N/A 12/17/2013   Procedure: TRANSESOPHAGEAL ECHOCARDIOGRAM (TEE);  Surgeon: Pixie Casino, MD;  Location[facility]COPY;  Service: Cardiovascular;  Laterality: N/A;  trish/ja   TRANSTHORACIC ECHOCARDIOGRAM  3/97/6734   LV systolic function normal with mild conc LVH; LA mildly dilated; trace MR/TR   UPPER GI ENDOSCOPY  2009   negative    Current Outpatient Medications  Medication Sig Dispense Refill   ANORO ELLIPTA 62.5-25 MCG/INH AEPB Inhale 1 puff into the lungs daily.     ascorbic acid (VITAMIN C) 1000 MG tablet Take 1,000 mg by mouth daily.     CALCIUM-MAG-VIT C-VIT D PO Take 1 tablet by mouth daily.     celecoxib (CELEBREX) 200 MG capsule Take 200 mg by mouth 2 (two) times daily.     colestipol (COLESTID) 1 g tablet Take 1 g by mouth as needed (stomach).     diltiazem  (CARDIZEM CD) 180 MG 24 hr capsule TAKE 1 CAPSULE DAILY. 90 capsule 3   famotidine (PEPCID) 20 MG tablet Take 20 mg by mouth 2 (two) times daily.     gabapentin (NEURONTIN) 100 MG capsule TAKE ONE CAPSULE BY MOUTH THREE TIMES DAILY. 180 capsule 0   glucose blood (FREESTYLE LITE) test strip Use as instructed 100 each 12   guaiFENesin (MUCINEX) 600 MG 12 hr tablet Take 600 mg by mouth 2 (two) times daily.     HYDROcodone-acetaminophen (NORCO/VICODIN) 5-325 MG tablet Take 1 tablet by mouth 2 (two) times daily as needed for moderate pain. 60 tablet 0   INVOKANA 100 MG TABS tablet Take 100 mg by mouth every morning.     JANUMET 50-1000 MG tablet Take 1 tablet by mouth 2 (two) times daily.     latanoprost (XALATAN) 0.005 % ophthalmic solution Place 1 drop into both eyes at bedtime.     LORazepam (ATIVAN) 0.5 MG tablet TAKE 1 OR 2 TABLETS AT BEDTIME. 60 tablet 5   magnesium oxide (MAG-OX) 400 MG tablet Take 400 mg by mouth daily.     metoprolol succinate (TOPROL-XL) 100 MG 24 hr tablet Take 1 tablet (100 mg total) by mouth 2 (two) times daily. 180 tablet 3   metoprolol tartrate (LOPRESSOR) 25 MG tablet Take 25 mg by mouth daily as needed (if HR increases).     Multiple Minerals-Vitamins (CALCIUM CITRATE PLUS/MAGNESIUM PO) Take 1 tablet by mouth daily.     nystatin (MYCOSTATIN/NYSTOP) powder Apply 1 application topically once a week.     nystatin ointment (MYCOSTATIN) Apply 1 application topically 2 (two) times daily as needed (IRRITATION).   0   omeprazole (PRILOSEC) 20 MG capsule Take 1 capsule (20 mg total) by mouth 2 (two) times daily before a meal.     ONETOUCH DELICA LANCETS FINE MISC 1 Units by Does not apply route as directed. 100 each 3   PARoxetine (PAXIL) 10 MG tablet TAKE 2 AND 1/2 TABLETS DAILY 225 tablet 0   potassium chloride (MICRO-K) 10 MEQ CR capsule Take 1 capsule (10 mEq total) by mouth 2 (two) times daily. 180 capsule 2   quinapril (ACCUPRIL) 40 MG tablet TAKE 1 TABLET ONCE DAILY. 90  tablet 1   rivaroxaban (XARELTO) 20 MG TABS tablet Take  1 tablet (20 mg total) by mouth daily with supper. Restart only on Sunday 90 tablet 3   rOPINIRole (REQUIP) 1 MG tablet Prn up to 3 tab a day- 90 tablet 5   rosuvastatin (CRESTOR) 10 MG tablet TAKE ONE TABLET AT BEDTIME. 90 tablet 3   senna (SENOKOT) 8.6 MG TABS Take 1 tablet by mouth at bedtime as needed for mild constipation.     torsemide (DEMADEX) 20 MG tablet TAKE TWO TABLETS BY MOUTH TWICE DAILY. 360 tablet 0   traZODone (DESYREL) 100 MG tablet TAKE 1/2 TO 1 TABLET AT BEDTIME. (Patient taking differently: Take 100 mg by mouth at bedtime.) 90 tablet 3   No current facility-administered medications for this visit.    Allergies as of 06/15/2021 - Review Complete 06/15/2021  Allergen Reaction Noted   Benadryl [diphenhydramine hcl] Other (See Comments) 06/15/2011   Clopidogrel bisulfate  12/02/2020   Ioversol  12/16/2020   Benadryl [diphenhydramine] Anxiety 02/28/2021   Iodinated diagnostic agents Palpitations 06/15/2011    Vitals: BP (!) 125/55   Pulse 77   Ht 5' 2" (1.575 m)   Wt 172 lb (78 kg)   BMI 31.46 kg/m  Last Weight:  Wt Readings from Last 1 Encounters:  06/15/21 172 lb (78 kg)   WPV:XYIA mass index is 31.46 kg/m.     Last Height:   Ht Readings from Last 1 Encounters:  06/15/21 5' 2" (1.575 m)    Physical exam:  General: The patient is awake, alert and appears in acute pain and distress. She injured her shoulder and hip on the left , fractured both in July as a result of a fall. Did initially well after hospital . She is in a wheelchair since she re-injured her hip and shoulder during a PT session. The patient is obese.  Head: Normocephalic, atraumatic. Neck is supple. Mallampati 3  neck circumference:16.25 . Nasal airflow intact , TMJ is evident . Retrognathia is seen.   Cardiovascular:  irregular rate and rhythm Respiratory: Lungs are clear to auscultation. Minimal excursion.  Skin:  Without evidence of  edema, or rash Trunk: BMI is elevated . Neurologic exam : The patient is awake and alert, oriented to place and time.   She appears jaundiced- .  Memory subjective  described as intact.  Attention span & concentration ability appears normal.  Montreal Cognitive Assessment  03/05/2019  Visuospatial/ Executive (0/5) 4  Naming (0/3) 3  Attention: Read list of digits (0/2) 2  Attention: Read list of letters (0/1) 1  Attention: Serial 7 subtraction starting at 100 (0/3) 3  Language: Repeat phrase (0/2) 2  Language : Fluency (0/1) 1  Abstraction (0/2) 2  Delayed Recall (0/5) 5  Orientation (0/6) 6  Total 29   MOCA 29/30    Speech is fluent, without dysarthria, mild dysphonia. Mood and affect are depressed.   Cranial nerves: Pupils are equal and briskly reactive to light. Funduscopic exam deferred. Extraocular movements in vertical and horizontal planes intact and without nystagmus. Visual fields by finger perimetry are intact.Hearing to finger rub intact.   Facial motor strength is symmetric and tongue and uvula move midline.  Shoulder shrug was symmetrical.  Motor exam:  Normal tone, muscle bulk and symmetric strength in all extremities. Sensory:  Fine touch, pinprick and vibration in the upper extremities was normal. Coordination: Finger-to-nose maneuver  without evidence of ataxia, dysmetria or tremor. Gait and station: Patient walks without assistive device. Strength within normal limits.Stance is stable and normal.Tandem gait is unfragmented.  Turns with 3 Steps.  Deep tendon reflexes: in the upper and lower extremities are symmetric and intact. Babinski maneuver response is downgoing.     Lbs:   2 wk ago  (05/31/21) 3 mo ago  (03/07/21) 3 mo ago  (03/06/21) 3 mo ago  (03/05/21) 3 mo ago  (03/04/21) 3 mo ago  (03/02/21) 3 mo ago  (03/01/21)    WBC 3.4 - 10.8 x10E3/uL 7.1  8.2 R  8.9 R  8.1 R  9.5 R  11.4 High  R  11.4 High  R   RBC 3.77 - 5.28 x10E6/uL 3.61 Low   2.79 Low  R   2.89 Low  R  2.68 Low  R  2.74 Low  R  3.17 Low  R  3.17 Low  R   Hemoglobin 11.1 - 15.9 g/dL 9.0 Low   8.3 Low  R  8.7 Low  R  8.1 Low  R  8.1 Low  R  9.6 Low  R  9.3 Low  R   Hematocrit 34.0 - 46.6 % 29.1 Low   25.0 Low  R  24.7 Low  R  23.4 Low  R  23.9 Low  R  27.1 Low  R  27.8 Low  R   MCV 79 - 97 fL 81  89.6 R  85.5 R  87.3 R  87.2 R  85.5 R  87.7 R   MCH 26.6 - 33.0 pg 24.9 Low   29.7 R  30.1 R  30.2 R  29.6 R  30.3 R  29.3 R   MCHC 31.5 - 35.7 g/dL 30.9 Low   33.2 R  35.2 R  34.6 R  33.9 R  35.4 R  33.5 R   RDW 11.7 - 15.4 % 14.4  13.3 R  12.9 R  12.8 R  13.1 R  12.7 R  12.9 R   Platelets 150 - 450 x10E3/uL 242  234 R  203 R  188 R  177 R  166 R  209 R   nRBC   0.0 R, CM  0.0 R, CM  0.0 R, CM  0.0 R, CM  0.0 R, CM  0.0 R, CM   Resulting Agency  [facility] _0  [facility]       Narrative Performed by: Maryan Puls Performed at:  [facility]         Ref Range & Units 10 d ago  (06/05/21) 2 wk ago  (05/31/21) 3 mo ago  (03/07/21) 3 mo ago  (03/06/21) 3 mo ago  (03/05/21) 3 mo ago  (03/04/21) 3 mo ago  (03/02/21)  Glucose 70 - 99 mg/dL 124 High   97  164 High  CM  235 High  CM  168 High  CM  162 High  CM  153 High  CM   BUN 8 - 27 mg/dL _1 R  15 R  18 R  14 R  13 R   Creatinine, Ser 0.57 - 1.00 mg/dL 0.98  0.99  0.70 R  0.91 R  0.93 R  0.98 R  1.01 High  R   eGFR >59 mL/min/1.73 61  61        BUN/Creatinine Ratio 12 - _2 Sodium 134 - 144 mmol/L 131 Low   129 Low   129  Low  R  126 Low  R  123 Low  R  125 Low  R  130 Low  R   Potassium 3.5 - 5.2 mmol/L 4.9  6.0 High Panic  CM  3.6 R  3.9 R  4.1 R  4.3 R  4.5 R   Chloride 96 - 106 mmol/L 97  95 Low   98 R  93 Low  R  91 Low  R  94 Low  R  97 Low  R   CO2 20 - 29 mmol/L _0 R  22 R  20 Low  R  23 R  24 R   Calcium 8.7 - 10.3 mg/dL 9.2  9.5  8.7 Low  R  8.5 Low  R  8.4 Low  R  8.8 Low  R  9.0 R     Assessment:  After physical and neurologic  examination, review of laboratory studies,  Personal review of imaging studies, reports of other /same  Imaging studies ,  Results of polysomnography/ neurophysiology testing and pre-existing records as far as provided in visit., my assessment is :  0) PLMs, RLS -  Can be partly due to spinal stenosis, but she responded well to Requip and transfusions.  Refilled Requip. Increased to 1 mg up to 3 a day. Now exacerbated after a fracture due to a fall.  She fell from the side on the bed. She fell asleep and fell forward!   1) Her memory and word retrieval is continuesly improving.   2) recurrent anemia, recurrent blood loss, and CHF, CKD,  pericardial effusion - No malignancy found. SOB,  Her CHF related to Atrial fibrillation, persistent  -She cannot undergo ablation while in almost constant atrial fibrillation- and her a fib has taken her energy away, her stamina.She just regained a lot of fluid.   3) request from Dr. Frederik Pear to perform a Left anterior hip arthroplasty under spinal anesthesia on patient.  Patient needs surgery clearance. Patient was last seen 12/26/2020 and is scheduled for OV on 06/26/2021. Office protocol is a risk assessment can be sent to surgeon if patient has been seen in 60 days or less.    Sending to Dr. Annamaria Boots for risk assessment once patient has been seen on 06/26/2021. Cardiac clearance was already given.     Plan:  Treatment plan and additional workup :  The patient will be given surgical clearance from neurology.  I think it is clearly a case of the associated trauma with the fall the reinjury and the fracture related anemia that I see in her blood cell counts that her restless legs have been triggered.  The patient has no choice but to follow the irresistible urge to move and get each movement now is causing pain.  This is a very difficult situation.  I am she is already taking up to 3 times a day dopaminergic agonists and there is not much more we can give her in  terms of additional medication I hope that she will continue to get transfusion, iron supplementation and surgical correction of the orthopedic problem as soon as possible.  I will be happy to refill the Requip and generic form, I hope that pulmonology and cardiology also will approve of a soon to be scheduled surgical intervention with her orthopedic surgeon.  At this time I am not sure that trazodone helps her that much for sleep at 1 so I will allow her now up to 200  mg of trazodone 2 tablets to initiate sleep and Requip up to 3 mg a day.  [name] in 6-8 months with Np.      Asencion Partridge Dohmeier MD  06/15/2021   CC: [address], [address],  [place] [id number]

## 2021-06-16 ENCOUNTER — Telehealth: Payer: Self-pay | Admitting: Internal Medicine

## 2021-06-16 NOTE — Telephone Encounter (Signed)
Patient calling in about hip replacement surgery  Patient says she spoke w/ Deliah Goody @ [facility] & they advised her that they would not approve her hip surgery until she either has a iron infusion or takes iron pills & vitamin c supplement  Please call to clarify [place] [phone]

## 2021-06-16 NOTE — Telephone Encounter (Signed)
See below

## 2021-06-16 NOTE — Telephone Encounter (Signed)
If they have recent labs we would be happy to review that information or notes from GI regarding this. I do not have any recent labs for her.

## 2021-06-20 ENCOUNTER — Encounter: Payer: Self-pay | Admitting: Internal Medicine

## 2021-06-20 ENCOUNTER — Other Ambulatory Visit: Payer: Self-pay

## 2021-06-20 ENCOUNTER — Ambulatory Visit (INDEPENDENT_AMBULATORY_CARE_PROVIDER_SITE_OTHER): Payer: HMO | Admitting: Internal Medicine

## 2021-06-20 DIAGNOSIS — R21 Rash and other nonspecific skin eruption: Secondary | ICD-10-CM | POA: Diagnosis not present

## 2021-06-20 DIAGNOSIS — I5032 Chronic diastolic (congestive) heart failure: Secondary | ICD-10-CM

## 2021-06-20 DIAGNOSIS — D500 Iron deficiency anemia secondary to blood loss (chronic): Secondary | ICD-10-CM | POA: Diagnosis not present

## 2021-06-20 DIAGNOSIS — J449 Chronic obstructive pulmonary disease, unspecified: Secondary | ICD-10-CM | POA: Diagnosis not present

## 2021-06-20 MED ORDER — FLUCONAZOLE 150 MG PO TABS: 150.0000 mg | ORAL_TABLET | ORAL | 0 refills | Status: DC

## 2021-06-20 MED ORDER — NYSTATIN 100000 UNIT/GM EX OINT
1.0000 | TOPICAL_OINTMENT | Freq: Two times a day (BID) | CUTANEOUS | 6 refills | Status: DC | PRN
Start: 2021-06-20 — End: 2022-07-09

## 2021-06-20 MED ORDER — NYSTATIN 100000 UNIT/GM EX POWD: 1.0000 "application " | CUTANEOUS | 6 refills | Status: DC

## 2021-06-20 NOTE — Progress Notes (Signed)
   Subjective:   Patient ID: [name], female    DOB: 1951-05-29, 70 y.o.   MRN: [id number]  HPI The patient is a 70 YO female coming in for concerns about her GI provider not clearing her for surgery for left hip replacement. They are unaware about recent lab results except Hg stable around 9. She is feeling stable but having severe arthritis pain.   Review of Systems  Constitutional: Negative.   HENT: Negative.    Eyes: Negative.   Respiratory:  Negative for cough, chest tightness and shortness of breath.   Cardiovascular:  Negative for chest pain, palpitations and leg swelling.  Gastrointestinal:  Negative for abdominal distention, abdominal pain, constipation, diarrhea, nausea and vomiting.  Musculoskeletal:  Positive for arthralgias and gait problem.  Skin: Negative.   Psychiatric/Behavioral: Negative.     Objective:  Physical Exam Constitutional:      Appearance: She is well-developed.  HENT:     Head: Normocephalic and atraumatic.  Cardiovascular:     Rate and Rhythm: Normal rate and regular rhythm.  Pulmonary:     Effort: Pulmonary effort is normal. No respiratory distress.     Breath sounds: Normal breath sounds. No wheezing or rales.  Abdominal:     General: Bowel sounds are normal. There is no distension.     Palpations: Abdomen is soft.     Tenderness: There is no abdominal tenderness. There is no rebound.  Musculoskeletal:        General: Tenderness present.     Cervical back: Normal range of motion.  Skin:    General: Skin is warm and dry.  Neurological:     Mental Status: She is alert and oriented to person, place, and time.     Coordination: Coordination abnormal.    Vitals:   06/20/21 1404  BP: 130/64  Pulse: 67  Resp: 18  SpO2: 98%  Weight: 169 lb 6.4 oz (76.8 kg)  Height: 5\' 2"  (1.575 m)    This visit occurred during the SARS-CoV-2 public health emergency.  Safety protocols were in place, including screening questions prior to the visit,  additional usage of staff PPE, and extensive cleaning of exam room while observing appropriate contact time as indicated for disinfecting solutions.   Assessment & Plan:

## 2021-06-20 NOTE — Patient Instructions (Addendum)
We will get this sorted out with GI so you can be cleared for the surgery.  We have sent in the nystatin powder and cream and the diflucan for the yeast infection.

## 2021-06-20 NOTE — Telephone Encounter (Signed)
Pt coming in for a office visit this afternoon with Dr. Sharlet Salina.

## 2021-06-23 DIAGNOSIS — R21 Rash and other nonspecific skin eruption: Secondary | ICD-10-CM | POA: Insufficient documentation

## 2021-06-23 NOTE — Assessment & Plan Note (Signed)
Overall stable and seeing pulmonary next week for clearance as well.

## 2021-06-23 NOTE — Assessment & Plan Note (Signed)
We will explore iron benefits in case iron transfusion is needed prior to surgery. Will contact her GI provider to see if there is a reason for the delay of surgery.

## 2021-06-23 NOTE — Assessment & Plan Note (Signed)
Candida skin infection and refilled diflucan and two formulations of nystatin.

## 2021-06-23 NOTE — Assessment & Plan Note (Signed)
Stable with recent normal stress test.

## 2021-06-24 NOTE — Progress Notes (Signed)
HPI F former smoker last seen in 2020 for COPD with concern of possible DVT, complicated by YC1/ neuropathy, AFib, CHF,HBP, CAD/PCI, COPD, GERD, Hyperlipidemia, Restless Legs, Obesity, Glaucoma Hosp in June, 2020 with pericardial tamponade, cardiogenic shock, CHF. Echo 11/20/2018- EF 55-60%, mild RAE Walk Test 12/21/19- room air- lowest O2 sat 97%, max HR 126. NPSG 08/15/2017- AHI 0/ hr. desat to 89%/ mean 93%, PLMA 23.7/ hr, body weight 194 lbs ONOX 89% or less  For over 17 minutes ([facility]) PFT 09/30/20--Minimal obstruction (refused bronchodilator trial), possible min restriction, DLCO mild to mod reduction PFT 09/30/20--Minimal obstruction (refused bronchodilator trial), possible min restriction, DLCO mild to mod reduction ONOX 09/04/20 RA- Did not qualify- minimum O2 sat 90% ------------------------------------------------------------------------------   12/26/20- 29yoF former smoker followed  for COPD , complicated by KG8/ neuropathy, AFib/ Xarelto, CHF,HBP, CAD/PCI, GERD, Hyperlipidemia, Restless Legs, Obesity, Glaucoma, Hx DVT, GIB/ Anemia,  >>O2 sleep 2l/ [facility]   Not started yet- needs OV today, then ONOX, then order for O2 all w/in 30 days -Stiolto 2.5 Respimat, Anoro Covid vax- 3 Phizers    >Looking for ONOX result to qual home O2<   - apparently not done. We are ordering again.                    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 Now getting frequent rechecks and understands contribution to dyspnea.   Considering "Watchman" in lieu of anticoagulation.                             Asks if cheaper alternative to Anoro. PFT 09/30/20--Minimal obstruction (refused bronchodilator trial), possible min restriction, DLCO mild to mod reduction CXR 11/03/20- IMPRESSION: No evidence of active disease.  06/26/21- 23yoF former smoker followed  for COPD , complicated by JE5/ neuropathy, AFib/ Xarelto, CHF,HBP, CAD/PCI, GERD, Hyperlipidemia, Restless Legs, Obesity, Glaucoma, Hx DVT, GIB/  Anemia,  Sleeping in recliner- hip won't let her get in and out of her bed. -Stiolto 2.5 Respimat, Anoro Body weight today- Covid vax- 4 Phizers   Flu vax-today Needs surgical clearance- Dr Mayer Camel L hip arthroplasty/ spinal Arrival O2 sat today 94% Breathing has been very good- no cough or phlegm. No acute episodes. Anoro is working well as her only inhaled medication. Already cleared by Cardiology.  Being managed by GI and PCP for chronic anemia. I discussed how that impacts tissue oxygen delivery.  Sleeping in a recliner. Hip pain prevents her from climbing into her bed.  CXR 1V 02/28/21-  IMPRESSION: 1. No acute cardiopulmonary disease. 2. Left humeral neck fracture  ROS-see HPI   + = positive Constitutional:    weight loss+ last year, night sweats, fevers, chills, fatigue, lassitude. HEENT:    headaches, difficulty swallowing, tooth/dental problems, sore throat,       sneezing, itching, ear ache, nasal congestion, post nasal drip, snoring CV:    chest pain, orthopnea, PND, +swelling in lower extremities, anasarca,                                   dizziness, +palpitations Resp:   +shortness of breath with exertion or at rest.                productive cough,   +non-productive cough, coughing up of blood.              change in color  of mucus.  wheezing.   Skin:    rash or lesions. GI:  No-   heartburn, indigestion, abdominal pain, nausea, vomiting, diarrhea,                 change in bowel habits, lo+ss of appetite GU: dysuria, change in color of urine, no urgency or frequency.   flank pain. MS:   joint pain, stiffness, decreased range of motion, back pain. Neuro-     nothing unusual Psych:  change in mood or affect.  depression or anxiety.   memory loss.  OBJ- Physical Exam General- Alert, Oriented, Affect-appropriate, Distress- none acute, obese+ Skin- rash-none, lesions- none, excoriation- none Lymphadenopathy- none Head- atraumatic            Eyes- Gross vision intact,  PERRLA, conjunctivae and secretions clear            Ears- Hearing, canals-normal            Nose- Clear, no-Septal dev, mucus, polyps, erosion, perforation             Throat- Mallampati II , mucosa clear , drainage- none, tonsils- atrophic Neck- flexible , trachea midline, no stridor , thyroid nl, carotid no bruit Chest - symmetrical excursion , unlabored           Heart/CV- IRR/AFib+ /Slow- almost regular, no murmur , no gallop  , no rub, nl s1 s2                           - JVD- none , edema- none, stasis changes+, varices-+ superficial           Lung- clear to P&A/ no rales, wheeze- none, cough- none , dullness-none, rub- none           Chest wall-  Abd-  Br/ Gen/ Rectal- Not done, not indicated Extrem- cyanosis- none, clubbing, none, atrophy- none, strength- nl Neuro- grossly intact to observation

## 2021-06-26 ENCOUNTER — Ambulatory Visit: Payer: HMO | Admitting: Internal Medicine

## 2021-06-26 ENCOUNTER — Encounter: Payer: Self-pay | Admitting: *Deleted

## 2021-06-26 ENCOUNTER — Other Ambulatory Visit: Payer: Self-pay

## 2021-06-26 ENCOUNTER — Encounter: Payer: Self-pay | Admitting: Internal Medicine

## 2021-06-26 VITALS — BP 122/62 | HR 70 | Temp 97.7°F | Ht 62.0 in | Wt 169.0 lb

## 2021-06-26 DIAGNOSIS — J449 Chronic obstructive pulmonary disease, unspecified: Secondary | ICD-10-CM

## 2021-06-26 DIAGNOSIS — J9611 Chronic respiratory failure with hypoxia: Secondary | ICD-10-CM

## 2021-06-26 DIAGNOSIS — Z23 Encounter for immunization: Secondary | ICD-10-CM

## 2021-06-26 MED ORDER — UMECLIDINIUM-VILANTEROL 62.5-25 MCG/ACT IN AEPB: 1.0000 | INHALATION_SPRAY | Freq: Every day | RESPIRATORY_TRACT | 12 refills | Status: DC

## 2021-06-26 NOTE — Progress Notes (Signed)
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 Baker Janus stated that Ms. Zufall needs to be seen ASAP due to urgent need for hip replacement surgery and that she will need Iron infusion. She has received Iron in the past facilitated by PCP Dr Sharlet Salina. Reviewed referral process with hematologist and Baker Janus asked that she be seen ASAP. Referral sent over to [facility] for earlier appt. Baker Janus appreciated call and explanation.     Time spent counseling/coordinating care: 15-30 minutes

## 2021-06-26 NOTE — Patient Instructions (Signed)
You are clear from a pulmonary standpoint to go ahead with planned hip surgery.   I have refilled you Anoro inhaler. Glad you have been doing so well  Order- Flu vax- senior  Please call if we can help

## 2021-06-26 NOTE — Assessment & Plan Note (Signed)
Chronic and being followed. Discussed impact on oxygen delivery She is on long term anticoagulation with hx AFib and hx DVT.

## 2021-06-26 NOTE — Assessment & Plan Note (Signed)
Oxygenating well now. Not concerned. We agreed we can recheck overnight oximetry later, when she is stable after surgery. For now, she will be monitored immediately post-op.

## 2021-06-26 NOTE — Assessment & Plan Note (Signed)
Clinically doing very well, with just Anoro, no exacerbations,  And clear CXR in July. She can go ahead with planned hip surgery from a pulmonary standpoint.

## 2021-06-27 ENCOUNTER — Other Ambulatory Visit: Payer: Self-pay

## 2021-06-27 ENCOUNTER — Other Ambulatory Visit: Payer: Self-pay | Admitting: Internal Medicine

## 2021-06-28 ENCOUNTER — Telehealth: Payer: Self-pay | Admitting: Pharmacy Technician

## 2021-06-28 ENCOUNTER — Other Ambulatory Visit: Payer: Self-pay | Admitting: Internal Medicine

## 2021-06-28 DIAGNOSIS — D509 Iron deficiency anemia, unspecified: Secondary | ICD-10-CM | POA: Diagnosis not present

## 2021-06-28 NOTE — Telephone Encounter (Addendum)
Dr. Sharlet Salina,  Juluis Rainier note: Auth Submission: no auth needed Payer: [employer] Medication & CPT/J Code(s) submitted: Venofer (Iron Sucrose) J1756 Route of submission (phone, fax, portal): PHONE [phone] Auth type: Buy/Bill Units/visits requested: 5 Reference number: [id number] Approval from: 06/28/21 to 09/13/21   Patient will be schedule as soon as possible

## 2021-06-28 NOTE — Telephone Encounter (Signed)
Patient OV note and clearance form have been faxed back. Nothing further needed at this time.

## 2021-06-29 ENCOUNTER — Telehealth: Payer: Self-pay | Admitting: Podiatry

## 2021-06-29 NOTE — Telephone Encounter (Signed)
Pt left message asking about her diabetic shoes, She is not sure when she would qualify for them.  Upon checking she got them in April of 2022..  I called and spoke to pts wife and explained she got them for 2022 and does not qualify for them again until 2023.

## 2021-06-30 ENCOUNTER — Ambulatory Visit (INDEPENDENT_AMBULATORY_CARE_PROVIDER_SITE_OTHER): Payer: HMO

## 2021-06-30 ENCOUNTER — Other Ambulatory Visit: Payer: Self-pay

## 2021-06-30 VITALS — BP 139/53 | HR 50 | Temp 98.2°F | Resp 16 | Ht 62.0 in | Wt 177.0 lb

## 2021-06-30 DIAGNOSIS — D500 Iron deficiency anemia secondary to blood loss (chronic): Secondary | ICD-10-CM

## 2021-06-30 MED ORDER — FAMOTIDINE IN NACL 20-0.9 MG/50ML-% IV SOLN: 20.0000 mg | Freq: Once | INTRAVENOUS | Status: DC | PRN

## 2021-06-30 MED ORDER — ALBUTEROL SULFATE HFA 108 (90 BASE) MCG/ACT IN AERS: 2.0000 | INHALATION_SPRAY | Freq: Once | RESPIRATORY_TRACT | Status: DC | PRN

## 2021-06-30 MED ORDER — METHYLPREDNISOLONE SODIUM SUCC 125 MG IJ SOLR: 125.0000 mg | Freq: Once | INTRAMUSCULAR | Status: DC | PRN

## 2021-06-30 MED ORDER — DIPHENHYDRAMINE HCL 50 MG/ML IJ SOLN: 50.0000 mg | Freq: Once | INTRAMUSCULAR | Status: DC | PRN

## 2021-06-30 MED ORDER — EPINEPHRINE 0.3 MG/0.3ML IJ SOAJ: 0.3000 mg | Freq: Once | INTRAMUSCULAR | Status: DC | PRN

## 2021-06-30 MED ORDER — SODIUM CHLORIDE 0.9 % IV SOLN
200.0000 mg | Freq: Once | INTRAVENOUS | Status: AC
  Administered 2021-06-30: 200 mg via INTRAVENOUS
  Filled 2021-06-30: qty 10

## 2021-06-30 MED ORDER — DIPHENHYDRAMINE HCL 25 MG PO CAPS: 50.0000 mg | ORAL_CAPSULE | Freq: Once | ORAL | Status: DC

## 2021-06-30 MED ORDER — SODIUM CHLORIDE 0.9 % IV SOLN: Freq: Once | INTRAVENOUS | Status: DC | PRN

## 2021-06-30 MED ORDER — ACETAMINOPHEN 325 MG PO TABS: 650.0000 mg | ORAL_TABLET | Freq: Once | ORAL | Status: DC

## 2021-06-30 NOTE — Progress Notes (Signed)
Diagnosis: Iron Deficiency Anemia  Provider:  Marshell Garfinkel, MD  Procedure: Infusion  IV Type: Peripheral, IV Location: L Forearm  Venofer (Iron Sucrose), Dose: 200 mg  Infusion Start Time: 1792  Infusion Stop Time: 1783  Post Infusion IV Care: Observation period completed and Peripheral IV Discontinued  Discharge: Condition: Good, Destination: Home . AVS provided to patient.   Performed by:  Anniece Bleiler, Sherlon Handing, LPN

## 2021-07-03 ENCOUNTER — Encounter: Payer: Self-pay | Admitting: Internal Medicine

## 2021-07-03 ENCOUNTER — Ambulatory Visit: Payer: HMO | Admitting: Podiatry

## 2021-07-03 ENCOUNTER — Ambulatory Visit (INDEPENDENT_AMBULATORY_CARE_PROVIDER_SITE_OTHER): Payer: HMO

## 2021-07-03 ENCOUNTER — Other Ambulatory Visit: Payer: Self-pay

## 2021-07-03 VITALS — BP 154/83 | HR 69 | Temp 98.7°F | Resp 16 | Ht 62.0 in | Wt 171.0 lb

## 2021-07-03 DIAGNOSIS — D500 Iron deficiency anemia secondary to blood loss (chronic): Secondary | ICD-10-CM | POA: Diagnosis not present

## 2021-07-03 MED ORDER — DIPHENHYDRAMINE HCL 25 MG PO CAPS: 50.0000 mg | ORAL_CAPSULE | Freq: Once | ORAL | Status: DC

## 2021-07-03 MED ORDER — ACETAMINOPHEN 325 MG PO TABS: 650.0000 mg | ORAL_TABLET | Freq: Once | ORAL | Status: DC

## 2021-07-03 MED ORDER — EPINEPHRINE 0.3 MG/0.3ML IJ SOAJ: 0.3000 mg | Freq: Once | INTRAMUSCULAR | Status: DC | PRN

## 2021-07-03 MED ORDER — METHYLPREDNISOLONE SODIUM SUCC 125 MG IJ SOLR: 125.0000 mg | Freq: Once | INTRAMUSCULAR | Status: DC | PRN

## 2021-07-03 MED ORDER — SODIUM CHLORIDE 0.9 % IV SOLN
200.0000 mg | Freq: Once | INTRAVENOUS | Status: AC
  Administered 2021-07-03: 200 mg via INTRAVENOUS
  Filled 2021-07-03: qty 10

## 2021-07-03 MED ORDER — ALBUTEROL SULFATE HFA 108 (90 BASE) MCG/ACT IN AERS: 2.0000 | INHALATION_SPRAY | Freq: Once | RESPIRATORY_TRACT | Status: DC | PRN

## 2021-07-03 MED ORDER — SODIUM CHLORIDE 0.9 % IV SOLN: Freq: Once | INTRAVENOUS | Status: DC | PRN

## 2021-07-03 MED ORDER — DIPHENHYDRAMINE HCL 50 MG/ML IJ SOLN: 50.0000 mg | Freq: Once | INTRAMUSCULAR | Status: DC | PRN

## 2021-07-03 MED ORDER — FAMOTIDINE IN NACL 20-0.9 MG/50ML-% IV SOLN: 20.0000 mg | Freq: Once | INTRAVENOUS | Status: DC | PRN

## 2021-07-03 NOTE — Progress Notes (Signed)
Diagnosis: Iron Deficiency Anemia  Provider:  Marshell Garfinkel, MD  Procedure: Infusion  IV Type: Peripheral, IV Location: R Forearm  Venofer (Iron Sucrose), Dose: 200 mg  Infusion Start Time: 13.55 07/03/2021  Infusion Stop Time: 14.20 07/03/2021  Post Infusion IV Care: Peripheral IV Discontinued  Discharge: Condition: Good, Destination: Home . AVS provided to patient.   Performed by:  Arnoldo Morale, RN

## 2021-07-04 ENCOUNTER — Encounter: Payer: Self-pay | Admitting: Internal Medicine

## 2021-07-05 ENCOUNTER — Ambulatory Visit: Payer: HMO

## 2021-07-10 ENCOUNTER — Other Ambulatory Visit: Payer: Self-pay

## 2021-07-10 ENCOUNTER — Ambulatory Visit (INDEPENDENT_AMBULATORY_CARE_PROVIDER_SITE_OTHER): Payer: HMO

## 2021-07-10 VITALS — BP 130/77 | HR 74 | Temp 97.5°F | Resp 16

## 2021-07-10 DIAGNOSIS — D500 Iron deficiency anemia secondary to blood loss (chronic): Secondary | ICD-10-CM

## 2021-07-10 MED ORDER — ACETAMINOPHEN 325 MG PO TABS: 650.0000 mg | ORAL_TABLET | Freq: Once | ORAL | Status: DC

## 2021-07-10 MED ORDER — SODIUM CHLORIDE 0.9 % IV SOLN: Freq: Once | INTRAVENOUS | Status: DC | PRN

## 2021-07-10 MED ORDER — FAMOTIDINE IN NACL 20-0.9 MG/50ML-% IV SOLN: 20.0000 mg | Freq: Once | INTRAVENOUS | Status: DC | PRN

## 2021-07-10 MED ORDER — EPINEPHRINE 0.3 MG/0.3ML IJ SOAJ: 0.3000 mg | Freq: Once | INTRAMUSCULAR | Status: DC | PRN

## 2021-07-10 MED ORDER — METHYLPREDNISOLONE SODIUM SUCC 125 MG IJ SOLR: 125.0000 mg | Freq: Once | INTRAMUSCULAR | Status: DC | PRN

## 2021-07-10 MED ORDER — DIPHENHYDRAMINE HCL 50 MG/ML IJ SOLN: 50.0000 mg | Freq: Once | INTRAMUSCULAR | Status: DC | PRN

## 2021-07-10 MED ORDER — DIPHENHYDRAMINE HCL 25 MG PO CAPS: 50.0000 mg | ORAL_CAPSULE | Freq: Once | ORAL | Status: DC

## 2021-07-10 MED ORDER — ALBUTEROL SULFATE HFA 108 (90 BASE) MCG/ACT IN AERS: 2.0000 | INHALATION_SPRAY | Freq: Once | RESPIRATORY_TRACT | Status: DC | PRN

## 2021-07-10 MED ORDER — SODIUM CHLORIDE 0.9 % IV SOLN
200.0000 mg | Freq: Once | INTRAVENOUS | Status: AC
  Administered 2021-07-10: 15:00:00 200 mg via INTRAVENOUS
  Filled 2021-07-10: qty 10

## 2021-07-10 NOTE — Progress Notes (Signed)
Diagnosis: Iron Deficiency Anemia  Provider:  Marshell Garfinkel, MD  Procedure: Infusion  IV Type: Peripheral, IV Location: L Forearm  Venofer (Iron Sucrose), Dose: 200 mg  Infusion Start Time: 15.10 07/10/2021  Infusion Stop Time: 15.30 07/10/2021  Post Infusion IV Care: Peripheral IV Discontinued  Discharge: Condition: Good, Destination: Home . AVS provided to patient.   Performed by:  Arnoldo Morale, RN

## 2021-07-11 ENCOUNTER — Other Ambulatory Visit: Payer: Self-pay | Admitting: Internal Medicine

## 2021-07-12 ENCOUNTER — Other Ambulatory Visit: Payer: Self-pay

## 2021-07-12 ENCOUNTER — Ambulatory Visit (INDEPENDENT_AMBULATORY_CARE_PROVIDER_SITE_OTHER): Payer: HMO

## 2021-07-12 VITALS — BP 163/81 | HR 81 | Temp 97.3°F | Resp 16 | Ht 62.0 in | Wt 171.2 lb

## 2021-07-12 DIAGNOSIS — D500 Iron deficiency anemia secondary to blood loss (chronic): Secondary | ICD-10-CM | POA: Diagnosis not present

## 2021-07-12 MED ORDER — DIPHENHYDRAMINE HCL 25 MG PO CAPS: 50.0000 mg | ORAL_CAPSULE | Freq: Once | ORAL | Status: DC

## 2021-07-12 MED ORDER — DIPHENHYDRAMINE HCL 50 MG/ML IJ SOLN: 50.0000 mg | Freq: Once | INTRAMUSCULAR | Status: DC | PRN

## 2021-07-12 MED ORDER — ACETAMINOPHEN 325 MG PO TABS: 650.0000 mg | ORAL_TABLET | Freq: Once | ORAL | Status: DC

## 2021-07-12 MED ORDER — EPINEPHRINE 0.3 MG/0.3ML IJ SOAJ: 0.3000 mg | Freq: Once | INTRAMUSCULAR | Status: DC | PRN

## 2021-07-12 MED ORDER — SODIUM CHLORIDE 0.9 % IV SOLN
200.0000 mg | Freq: Once | INTRAVENOUS | Status: AC
  Administered 2021-07-12: 200 mg via INTRAVENOUS
  Filled 2021-07-12: qty 10

## 2021-07-12 MED ORDER — FAMOTIDINE IN NACL 20-0.9 MG/50ML-% IV SOLN: 20.0000 mg | Freq: Once | INTRAVENOUS | Status: DC | PRN

## 2021-07-12 MED ORDER — SODIUM CHLORIDE 0.9 % IV SOLN: Freq: Once | INTRAVENOUS | Status: DC | PRN

## 2021-07-12 MED ORDER — METHYLPREDNISOLONE SODIUM SUCC 125 MG IJ SOLR: 125.0000 mg | Freq: Once | INTRAMUSCULAR | Status: DC | PRN

## 2021-07-12 MED ORDER — ALBUTEROL SULFATE HFA 108 (90 BASE) MCG/ACT IN AERS: 2.0000 | INHALATION_SPRAY | Freq: Once | RESPIRATORY_TRACT | Status: DC | PRN

## 2021-07-12 NOTE — Progress Notes (Signed)
Diagnosis: Iron Deficiency Anemia  Provider:  Marshell Garfinkel, MD  Procedure: Infusion  IV Type: Peripheral, IV Location: L Hand  Venofer (Iron Sucrose), Dose: 300 mg  Infusion Start Time: 3570  Infusion Stop Time: 1450  Post Infusion IV Care: Peripheral IV Discontinued  Discharge: Condition: Good, Destination: Home . AVS provided to patient.   Performed by:  Charlie Pitter, RN

## 2021-07-14 ENCOUNTER — Other Ambulatory Visit: Payer: Self-pay

## 2021-07-14 ENCOUNTER — Ambulatory Visit (INDEPENDENT_AMBULATORY_CARE_PROVIDER_SITE_OTHER): Payer: HMO

## 2021-07-14 VITALS — BP 149/74 | HR 82 | Temp 98.8°F | Resp 18 | Ht 62.0 in | Wt 171.2 lb

## 2021-07-14 DIAGNOSIS — D500 Iron deficiency anemia secondary to blood loss (chronic): Secondary | ICD-10-CM

## 2021-07-14 MED ORDER — ACETAMINOPHEN 325 MG PO TABS: 650.0000 mg | ORAL_TABLET | Freq: Once | ORAL | Status: DC

## 2021-07-14 MED ORDER — DIPHENHYDRAMINE HCL 50 MG/ML IJ SOLN: 50.0000 mg | Freq: Once | INTRAMUSCULAR | Status: DC | PRN

## 2021-07-14 MED ORDER — ALBUTEROL SULFATE HFA 108 (90 BASE) MCG/ACT IN AERS: 2.0000 | INHALATION_SPRAY | Freq: Once | RESPIRATORY_TRACT | Status: DC | PRN

## 2021-07-14 MED ORDER — METHYLPREDNISOLONE SODIUM SUCC 125 MG IJ SOLR: 125.0000 mg | Freq: Once | INTRAMUSCULAR | Status: DC | PRN

## 2021-07-14 MED ORDER — SODIUM CHLORIDE 0.9 % IV SOLN
200.0000 mg | Freq: Once | INTRAVENOUS | Status: AC
  Administered 2021-07-14: 200 mg via INTRAVENOUS
  Filled 2021-07-14: qty 10

## 2021-07-14 MED ORDER — DIPHENHYDRAMINE HCL 25 MG PO CAPS: 50.0000 mg | ORAL_CAPSULE | Freq: Once | ORAL | Status: DC

## 2021-07-14 MED ORDER — EPINEPHRINE 0.3 MG/0.3ML IJ SOAJ: 0.3000 mg | Freq: Once | INTRAMUSCULAR | Status: DC | PRN

## 2021-07-14 MED ORDER — SODIUM CHLORIDE 0.9 % IV SOLN: Freq: Once | INTRAVENOUS | Status: DC | PRN

## 2021-07-14 MED ORDER — FAMOTIDINE IN NACL 20-0.9 MG/50ML-% IV SOLN: 20.0000 mg | Freq: Once | INTRAVENOUS | Status: DC | PRN

## 2021-07-14 NOTE — Progress Notes (Signed)
Diagnosis: Iron Deficiency Anemia  Provider:  Marshell Garfinkel, MD  Procedure: Infusion  IV Type: Peripheral, IV Location: L Antecubital  Venofer (Iron Sucrose), Dose: 200 mg  Infusion Start Time: 0630  Infusion Stop Time: 1601  Post Infusion IV Care: Peripheral IV Discontinued  Discharge: Condition: Good, Destination: Home . AVS provided to patient.   Performed by:  Koren Shiver, RN

## 2021-07-19 ENCOUNTER — Other Ambulatory Visit: Payer: Self-pay

## 2021-07-19 ENCOUNTER — Encounter: Payer: Self-pay | Admitting: Internal Medicine

## 2021-07-19 ENCOUNTER — Ambulatory Visit (INDEPENDENT_AMBULATORY_CARE_PROVIDER_SITE_OTHER): Payer: HMO | Admitting: Internal Medicine

## 2021-07-19 VITALS — BP 124/70 | HR 59 | Resp 18 | Ht 62.0 in | Wt 171.1 lb

## 2021-07-19 DIAGNOSIS — D500 Iron deficiency anemia secondary to blood loss (chronic): Secondary | ICD-10-CM

## 2021-07-19 LAB — CBC
HCT: 33.4 % — ABNORMAL LOW (ref 36.0–46.0)
Hemoglobin: 10.7 g/dL — ABNORMAL LOW (ref 12.0–15.0)
MCHC: 31.9 g/dL (ref 30.0–36.0)
MCV: 79.9 fl (ref 78.0–100.0)
Platelets: 206 10*3/uL (ref 150.0–400.0)
RBC: 4.18 Mil/uL (ref 3.87–5.11)
RDW: 24.9 % — ABNORMAL HIGH (ref 11.5–15.5)
WBC: 5.9 10*3/uL (ref 4.0–10.5)

## 2021-07-19 LAB — FERRITIN: Ferritin: 163.4 ng/mL (ref 10.0–291.0)

## 2021-07-19 MED ORDER — FLUCONAZOLE 150 MG PO TABS: 150.0000 mg | ORAL_TABLET | ORAL | 2 refills | Status: DC

## 2021-07-19 NOTE — Progress Notes (Signed)
   Subjective:   Patient ID: [name], female    DOB: 01-25-1951, 70 y.o.   MRN: [id number]  HPI The patient is a 70 YO female coming in for follow up iron infusions.   Review of Systems  Constitutional: Negative.   HENT: Negative.    Eyes: Negative.   Respiratory:  Negative for cough, chest tightness and shortness of breath.   Cardiovascular:  Negative for chest pain, palpitations and leg swelling.  Gastrointestinal:  Negative for abdominal distention, abdominal pain, constipation, diarrhea, nausea and vomiting.  Musculoskeletal:  Positive for arthralgias and back pain.  Skin: Negative.   Neurological: Negative.   Psychiatric/Behavioral: Negative.     Objective:  Physical Exam Constitutional:      Appearance: She is well-developed. She is obese.  HENT:     Head: Normocephalic and atraumatic.  Cardiovascular:     Rate and Rhythm: Normal rate and regular rhythm.  Pulmonary:     Effort: Pulmonary effort is normal. No respiratory distress.     Breath sounds: Normal breath sounds. No wheezing or rales.  Abdominal:     General: Bowel sounds are normal. There is no distension.     Palpations: Abdomen is soft.     Tenderness: There is no abdominal tenderness. There is no rebound.  Musculoskeletal:        General: Tenderness present.     Cervical back: Normal range of motion.  Skin:    General: Skin is warm and dry.  Neurological:     Mental Status: She is alert and oriented to person, place, and time.     Coordination: Coordination abnormal.    Vitals:   07/19/21 0928  BP: 124/70  Pulse: (!) 59  Resp: 18  SpO2: 98%  Weight: 171 lb 1.6 oz (77.6 kg)  Height: 5\' 2"  (1.575 m)   This visit occurred during the SARS-CoV-2 public health emergency.  Safety protocols were in place, including screening questions prior to the visit, additional usage of staff PPE, and extensive cleaning of exam room while observing appropriate contact time as indicated for disinfecting  solutions.   Assessment & Plan:

## 2021-07-19 NOTE — Assessment & Plan Note (Signed)
Recent iron infusions and rechecking ferritin and CBC today for stores.

## 2021-07-19 NOTE — Patient Instructions (Signed)
We will recheck the labs today for the blood counts and iron levels.

## 2021-07-24 ENCOUNTER — Other Ambulatory Visit: Payer: Self-pay | Admitting: Cardiovascular Disease

## 2021-07-25 ENCOUNTER — Encounter: Payer: Self-pay | Admitting: Podiatry

## 2021-07-25 ENCOUNTER — Other Ambulatory Visit: Payer: Self-pay

## 2021-07-25 ENCOUNTER — Ambulatory Visit (INDEPENDENT_AMBULATORY_CARE_PROVIDER_SITE_OTHER): Payer: HMO | Admitting: Podiatry

## 2021-07-25 DIAGNOSIS — E1142 Type 2 diabetes mellitus with diabetic polyneuropathy: Secondary | ICD-10-CM | POA: Diagnosis not present

## 2021-07-25 DIAGNOSIS — M79674 Pain in right toe(s): Secondary | ICD-10-CM | POA: Diagnosis not present

## 2021-07-25 DIAGNOSIS — B351 Tinea unguium: Secondary | ICD-10-CM | POA: Diagnosis not present

## 2021-07-25 DIAGNOSIS — M79675 Pain in left toe(s): Secondary | ICD-10-CM | POA: Diagnosis not present

## 2021-07-25 NOTE — Telephone Encounter (Signed)
Prescription refill request for Xarelto received.  Indication: afib, DVT Last office visit: 05/31/2021, Quentin Ore Weight: 77.6 kg  Age: 71 yo  Scr: 0.98, 06/05/2021 CrCl:  64 ml/min   Refill sent.

## 2021-07-26 ENCOUNTER — Encounter: Payer: Self-pay | Admitting: Cardiovascular Disease

## 2021-07-26 ENCOUNTER — Ambulatory Visit: Payer: HMO | Admitting: Cardiovascular Disease

## 2021-07-26 VITALS — BP 113/56 | HR 61 | Ht 62.0 in | Wt 168.2 lb

## 2021-07-26 DIAGNOSIS — E669 Obesity, unspecified: Secondary | ICD-10-CM | POA: Diagnosis not present

## 2021-07-26 DIAGNOSIS — I5032 Chronic diastolic (congestive) heart failure: Secondary | ICD-10-CM

## 2021-07-26 DIAGNOSIS — I4811 Longstanding persistent atrial fibrillation: Secondary | ICD-10-CM | POA: Diagnosis not present

## 2021-07-26 DIAGNOSIS — I10 Essential (primary) hypertension: Secondary | ICD-10-CM

## 2021-07-26 DIAGNOSIS — I2510 Atherosclerotic heart disease of native coronary artery without angina pectoris: Secondary | ICD-10-CM | POA: Diagnosis not present

## 2021-07-26 DIAGNOSIS — E1169 Type 2 diabetes mellitus with other specified complication: Secondary | ICD-10-CM | POA: Diagnosis not present

## 2021-07-26 DIAGNOSIS — E785 Hyperlipidemia, unspecified: Secondary | ICD-10-CM | POA: Diagnosis not present

## 2021-07-26 DIAGNOSIS — Z7901 Long term (current) use of anticoagulants: Secondary | ICD-10-CM

## 2021-07-26 MED ORDER — XARELTO 20 MG PO TABS: 20.0000 mg | ORAL_TABLET | Freq: Every day | ORAL | 11 refills | Status: DC

## 2021-07-26 NOTE — Progress Notes (Signed)
Cardiology Office Note:    Date:  07/27/2021   ID:  [name], DOB Jun 27, 1951, MRN [id number]  PCP:  Hoyt Koch, MD  Cardiologist:  New (CHF clinic, [place]) Electrophysiologist:  None   Referring MD: Hoyt Koch, *   No chief complaint on file.   History of Present Illness:    [name] is a 70 y.o. female with a hx of CAD (posterior lateral ventricular branch stent in 2009), longstanding persistent atrial fibrillation, 3, diabetes mellitus type 2, hypertension, pericardiocentesis for hemothorax with tamponade in June 2020, while on treatment with oral anticoagulant. She is a retired Electrical engineer.  In March 22 she was hospitalized for lower GI bleeding with bright red blood per rectum and a hemoglobin of 6 requiring transfusion of 2 units PRBC.  She underwent upper endoscopy and colonoscopy with Dr. Cristina Gong on 11/04/2020, followed by a capsule enteroscopy on 11/07/2020.  The only identifiable source of bleeding were couple of AVMs in the colon, including one in the cecum that was friable and bled on contact.  This was electrocauterized.  Xarelto has been restarted.    In July 2022 she had a fall complicated by left humerus fracture and a left acetabular fracture, both of which were managed without surgery.  She did not have any problems with heart failure during her hospitalization.  She has had a very slow recovery from this.  She still has a lot of problems with left hip pain and walks with an assistive device.  Her orthopedic surgeon is Dr. Mayer Camel.  Prior to her fall her hemoglobin had gradually improved to 11.7, but began decreasing after the fall.  Her hemoglobin bottomed out at 8.3, without any evidence of GI bleeding.  She has been receiving iron infusions and her hemoglobin has started to improve, 10.7 last week.  During her hospitalization she had some issues with hyponatremia (which is a chronic longstanding issue) but did not have heart  failure.  Her most recent hemoglobin A1c was 7.1% in July.  Her most recent lipid profile showed an LDL cholesterol of 36 and HDL of 38.  Today, she has no cardiac complaints.  She has lost substantial weight and is much lighter now than her previous assumed "dry weight" of 182 pounds, measuring only 168 pounds today.  She denies lower extremity edema.  She has not had orthopnea or PND and does not have angina pectoris.  She is not aware of any palpitations, even though she is clearly in atrial fibrillation today.  Rate is well controlled.  She had a nuclear stress test in June 12, 2021 low risk findings without evidence of ischemia or previous infarction.  Her last echocardiogram in December 2020 showed LVEF of 45-50%.  This was a decline from June 2020 when the EF was 60%.  The most recent echocardiogram performed in April 2021 showed LVEF of 50-55%.  Both atria are moderately dilated.  There were no serious valvular abnormalities.  She had a normal perfusion pattern on her nuclear stress tests from 2012, 2014, 2016 and October 2022.  Past Medical History:  Diagnosis Date   Anemia, unspecified    Arthritis    Asthma    Atrial fibrillation with RVR ([place])    a. on Xarelto   Bell's palsy    Facial nerve decompression in 2001   CHF (congestive heart failure) (HCC)    Chronic low back pain    COPD with asthma ([place])    Coronary artery  disease    Myoview 04/12/11 was entirely normal. ECHO 02/26/08 showed only minor abnormalities. Stenting 05/26/08 of her posterolateral branch to the left circumflex coronary artery. Used a 2.5x18mm Taxus Monorail stent.myoview 2014 was without ischemia   Diabetes mellitus    Type 2   Early cataracts, bilateral    Fatty liver    GERD (gastroesophageal reflux disease)    Glaucoma    Glaucoma    Goiter    Heart murmur    History of nuclear stress test 2012; 2014   lexiscan; normal pattern of perfusion; normal, low risk scan    Hyperlipidemia    Hypertension     Panic disorder    Pneumonia 2008   Polycystic ovary disease    Hysterectomy in 1982 for this   Shortness of breath dyspnea    ECHO 02/26/08 showed only minor abnormalities   Spinal stenosis     Past Surgical History:  Procedure Laterality Date   ABDOMINAL HYSTERECTOMY  1982   & BSO; for polycystic ovary disease   CARDIOVERSION N/A 12/17/2013   Procedure: CARDIOVERSION;  Surgeon: Pixie Casino, MD;  Location: [place];  Service: Cardiovascular;  Laterality: N/A;   CARDIOVERSION N/A 09/30/2015   Procedure: CARDIOVERSION;  Surgeon: Dorothy Spark, MD;  Location: [place];  Service: Cardiovascular;  Laterality: N/A;   CARDIOVERSION N/A 06/22/2016   Procedure: CARDIOVERSION;  Surgeon: Skeet Latch, MD;  Location: [place];  Service: Cardiovascular;  Laterality: N/A;   CENTRAL LINE INSERTION  01/13/2019   Procedure: CENTRAL LINE INSERTION;  Surgeon: Jolaine Artist, MD;  Location: [place] [facility];  Service: Cardiovascular;;   COLONOSCOPY     last 2009; Dr Cristina Gong; due 2019   COLONOSCOPY WITH PROPOFOL N/A 01/23/2018   Procedure: COLONOSCOPY WITH PROPOFOL;  Surgeon: Ronald Lobo, MD;  Location: [place];  Service: Endoscopy;  Laterality: N/A;   COLONOSCOPY WITH PROPOFOL N/A 11/04/2020   Procedure: COLONOSCOPY WITH PROPOFOL;  Surgeon: Ronald Lobo, MD;  Location: [place];  Service: Endoscopy;  Laterality: N/A;   CORONARY ANGIOPLASTY  05/26/2008   Stenting of her posterolateral branch to the left circumflex coronary artery. Used a 2.5x12mm Taxus Monorail stent.   ESOPHAGOGASTRODUODENOSCOPY (EGD) WITH PROPOFOL N/A 01/23/2018   Procedure: ESOPHAGOGASTRODUODENOSCOPY (EGD) WITH PROPOFOL;  Surgeon: Ronald Lobo, MD;  Location: [place];  Service: Endoscopy;  Laterality: N/A;   ESOPHAGOGASTRODUODENOSCOPY (EGD) WITH PROPOFOL N/A 11/04/2020   Procedure: ESOPHAGOGASTRODUODENOSCOPY (EGD) WITH PROPOFOL;  Surgeon: Ronald Lobo, MD;  Location: [place];   Service: Endoscopy;  Laterality: N/A;   FACIAL NERVE DECOMPRESSION  2001/2002   bells palsy    GIVENS CAPSULE STUDY N/A 11/04/2020   Procedure: GIVENS CAPSULE STUDY;  Surgeon: Ronald Lobo, MD;  Location: [place];  Service: Endoscopy;  Laterality: N/A;   HOT HEMOSTASIS N/A 11/04/2020   Procedure: HOT HEMOSTASIS (ARGON PLASMA COAGULATION/BICAP);  Surgeon: Ronald Lobo, MD;  Location: [facility] END[facility]COPY;  Service: Endoscopy;  Laterality: N/A;   LAPAROSCOPIC CHOLECYSTECTOMY  06/15/2011    Dr Dalbert Batman   PERICARDIOCENTESIS N/A 01/13/2019   Procedure: PERICARDIOCENTESIS;  Surgeon: Burnell Blanks, MD;  Location: [place] [facility];  Service: Cardiovascular;  Laterality: N/A;   POLYPECTOMY  11/04/2020   Procedure: POLYPECTOMY;  Surgeon: Ronald Lobo, MD;  Location: [place];  Service: Endoscopy;;   RIGHT AND LEFT HEART CATH N/A 01/13/2019   Procedure: RIGHT AND LEFT HEART CATH;  Surgeon: Jolaine Artist, MD;  Location: [place] [facility];  Service: Cardiovascular;  Laterality: N/A;   TEE  WITHOUT CARDIOVERSION N/A 12/17/2013   Procedure: TRANSESOPHAGEAL ECHOCARDIOGRAM (TEE);  Surgeon: Pixie Casino, MD;  Location: [facility];  Service: Cardiovascular;  Laterality: N/A;  trish/ja   TRANSTHORACIC ECHOCARDIOGRAM  9/47/0962   LV systolic function normal with mild conc LVH; LA mildly dilated; trace MR/TR   UPPER GI ENDOSCOPY  2009   negative    Current Medications: Current Meds  Medication Sig   ascorbic acid (VITAMIN C) 1000 MG tablet Take 1,000 mg by mouth daily.   CALCIUM-MAG-VIT C-VIT D PO Take 1 tablet by mouth daily.   celecoxib (CELEBREX) 200 MG capsule Take 100 mg by mouth in the morning and at bedtime.   colestipol (COLESTID) 1 g tablet Take 1 g by mouth as needed (stomach).   diltiazem (CARDIZEM CD) 180 MG 24 hr capsule TAKE 1 CAPSULE DAILY.   famotidine (PEPCID) 20 MG tablet Take 20 mg by mouth 2 (two) times daily.   fluconazole (DIFLUCAN) 150 MG tablet Take 1 tablet  (150 mg total) by mouth every 3 (three) days.   gabapentin (NEURONTIN) 100 MG capsule TAKE ONE CAPSULE BY MOUTH THREE TIMES DAILY.   glucose blood (FREESTYLE LITE) test strip Use as instructed   guaiFENesin (MUCINEX) 600 MG 12 hr tablet Take 600 mg by mouth 2 (two) times daily.   HYDROcodone-acetaminophen (NORCO/VICODIN) 5-325 MG tablet Take 1 tablet by mouth 2 (two) times daily as needed for moderate pain.   INVOKANA 100 MG TABS tablet Take 100 mg by mouth every morning.   JANUMET 50-1000 MG tablet Take 1 tablet by mouth 2 (two) times daily.   latanoprost (XALATAN) 0.005 % ophthalmic solution Place 1 drop into both eyes at bedtime.   LORazepam (ATIVAN) 0.5 MG tablet TAKE 1 OR 2 TABLETS AT BEDTIME.   metoprolol succinate (TOPROL-XL) 100 MG 24 hr tablet Take 1 tablet (100 mg total) by mouth 2 (two) times daily.   Multiple Minerals-Vitamins (CALCIUM CITRATE PLUS/MAGNESIUM PO) Take 1 tablet by mouth daily.   nystatin (MYCOSTATIN/NYSTOP) powder Apply 1 application topically once a week.   nystatin ointment (MYCOSTATIN) Apply 1 application topically 2 (two) times daily as needed (IRRITATION).   omeprazole (PRILOSEC) 20 MG capsule Take 1 capsule (20 mg total) by mouth 2 (two) times daily before a meal.   PARoxetine (PAXIL) 10 MG tablet Take 2.5 tablets (25 mg total) by mouth every morning.   potassium chloride (MICRO-K) 10 MEQ CR capsule Take 1 capsule (10 mEq total) by mouth 2 (two) times daily.   quinapril (ACCUPRIL) 40 MG tablet TAKE 1 TABLET ONCE DAILY.   rOPINIRole (REQUIP) 1 MG tablet Prn up to 3 tab a day-   rosuvastatin (CRESTOR) 10 MG tablet TAKE ONE TABLET AT BEDTIME.   traZODone (DESYREL) 100 MG tablet Up to 200 mg at bedtime for sleep   umeclidinium-vilanterol (ANORO ELLIPTA) 62.5-25 MCG/ACT AEPB Inhale 1 puff into the lungs daily.   [DISCONTINUED] XARELTO 20 MG TABS tablet TAKE ONE TABLET BY MOUTH DAILY     Allergies:   Benadryl [diphenhydramine hcl], Clopidogrel bisulfate, Ioversol,  Benadryl [diphenhydramine], and Iodinated diagnostic agents   Social History   Socioeconomic History   Marital status: Married    Spouse name: Not on file   Number of children: 2   Years of education: master's   Highest education level: Not on file  Occupational History   Occupation: Electrical engineer    Employer: [employer]  Tobacco Use   Smoking status: Former    Packs/day: 1.50  Years: 30.00    Pack years: 45.00    Types: Cigarettes    Quit date: 08/13/1993    Years since quitting: 27.9   Smokeless tobacco: Never  Vaping Use   Vaping Use: Never used  Substance and Sexual Activity   Alcohol use: No   Drug use: No   Sexual activity: Not on file  Other Topics Concern   Not on file  Social History Narrative   ** Merged History Encounter **       Social Determinants of Health   Financial Resource Strain: Not on file  Food Insecurity: Not on file  Transportation Needs: Not on file  Physical Activity: Not on file  Stress: Not on file  Social Connections: Not on file     Family History: The patient's family history includes Cancer in her brother and sister; Colon cancer (age of onset: 37) in her brother; Colon polyps in her sister; Emphysema (age of onset: 56) in her mother; Gout in her brother; Heart attack (age of onset: 38) in her father; Hyperlipidemia in her brother; Hypertension in her brother; Pneumonia in her maternal grandmother; Ulcers in her mother. There is no history of Diabetes or Stroke.  ROS:   Please see the history of present illness.    All other systems are reviewed and are negative.   EKGs/Labs/Other Studies Reviewed:    The following studies were reviewed today: Echocardiogram December 09, 2019  1. Abnormal septal motion and hypokinesis as well as inferior basal  hypokinesis EF similar to echo done 05/2019 or slightly better . Left  ventricular ejection fraction, by estimation, is 50 to 55%. The left  ventricle has low normal  function. The left  ventricle demonstrates regional wall motion abnormalities (see scoring  diagram/findings for description). Left ventricular diastolic parameters  are indeterminate.   2. Right ventricular systolic function is normal. The right ventricular  size is normal.   3. Left atrial size was moderately dilated.   4. Right atrial size was moderately dilated.   5. The mitral valve is degenerative. Trivial mitral valve regurgitation.  No evidence of mitral stenosis.   6. The aortic valve is tricuspid. Aortic valve regurgitation is trivial.  Mild to moderate aortic valve sclerosis/calcification is present, without  any evidence of aortic stenosis.   7. The inferior vena cava is normal in size with greater than 50%  respiratory variability, suggesting right atrial pressure of 3 mmHg.   Nuclear stress test 06/12/2021   Findings are consistent with no prior ischemia and no prior myocardial infarction. The study is low risk.   No ST deviation was noted.   LV perfusion is normal. There is no evidence of ischemia. There is no evidence of infarction.   End diastolic cavity size is normal. End systolic cavity size is normal.   Normal perfusion EF may not be accurate due to afib estimated at 51% no RWMAls noted     EKG:  EKG is not ordered today.  The ECG from 05/31/2021 shows atrial fibrillation with controlled rate and left anterior fascicular block.  Recent Labs:  02/28/2021 hemoglobin A1c 7.1% 02/20/2021: Pro B Natriuretic peptide (BNP) 163.0 02/28/2021: ALT 16 03/01/2021: Magnesium 1.7; TSH 1.920 06/05/2021: BUN 17; Creatinine, Ser 0.98; Potassium 4.9; Sodium 131 07/19/2021: Hemoglobin 10.7; Platelets 206.0   11/14/2020  Creatinine 1.33, potassium 4.3, hemoglobin 10.3 Recent Lipid Panel    Component Value Date/Time   CHOL 104 09/23/2020 1601   CHOL 188 11/17/2019 1534   TRIG 150.0 (  H) 09/23/2020 1601   HDL 38.10 (L) 09/23/2020 1601   HDL 43 11/17/2019 1534   CHOLHDL 3  09/23/2020 1601   VLDL 30.0 09/23/2020 1601   LDLCALC 36 09/23/2020 1601   LDLCALC 108 (H) 11/17/2019 1534   LDLDIRECT 143.4 01/23/2008 1316    Physical Exam:    VS:  BP (!) 113/56 (BP Location: Left Arm, Patient Position: Sitting, Cuff Size: Large)    Pulse 61    Ht 5\' 2"  (1.575 m)    Wt 168 lb 3.2 oz (76.3 kg)    SpO2 96%    BMI 30.76 kg/m     Wt Readings from Last 3 Encounters:  07/26/21 168 lb 3.2 oz (76.3 kg)  07/19/21 171 lb 1.6 oz (77.6 kg)  07/14/21 171 lb 3.2 oz (77.7 kg)     General: Alert, oriented x3, no distress, borderline obese Head: no evidence of trauma, PERRL, EOMI, no exophtalmos or lid lag, no myxedema, no xanthelasma; normal ears, nose and oropharynx Neck: normal jugular venous pulsations and no hepatojugular reflux; brisk carotid pulses without delay and no carotid bruits Chest: clear to auscultation, no signs of consolidation by percussion or palpation, normal fremitus, symmetrical and full respiratory excursions Cardiovascular: normal position and quality of the apical impulse, irregular rhythm, normal first and second heart sounds, no murmurs, rubs or gallops Abdomen: no tenderness or distention, no masses by palpation, no abnormal pulsatility or arterial bruits, normal bowel sounds, no hepatosplenomegaly Extremities: no clubbing, cyanosis or edema; 2+ radial, ulnar and brachial pulses bilaterally; 2+ right femoral, posterior tibial and dorsalis pedis pulses; 2+ left femoral, posterior tibial and dorsalis pedis pulses; no subclavian or femoral bruits Neurological: grossly nonfocal Psych: Normal mood and affect  ASSESSMENT:    1. Longstanding persistent atrial fibrillation ([place])   2. Chronic diastolic CHF (congestive heart failure) ([place])   3. Long term (current) use of anticoagulants   4. Coronary artery disease involving native coronary artery of native heart without angina pectoris   5. Essential hypertension   6. Dyslipidemia   7. Diabetes mellitus type  2 in obese [facility])     PLAN:    In order of problems listed above:  AFib: Well rate controlled.  On appropriate anticoagulation with improving hemoglobin now that she is receiving iron supplements.  Good rate control is important in view of previous history of tachycardia cardiomyopathy.   CHA2DS2-VASc 6 (age, gender, hypertension, diabetes, CAD, history of heart failure/LV dysfunction).  We have previously discussed alternatives to anticoagulation.  I believe she is an excellent candidate for a Watchman device.  We talked about the purpose of device, implantation procedure follow-up anticoagulation protocol and possible complications.  She appears skeptical but is willing to research the topic. CHF: Clinically euvolemic on SGLT2 inhibitor and not requiring loop diuretics (she only takes the torsemide as needed and this occurs less than monthly).  On beta-blockers and ACE inhibitor.  Preserved LVEF.  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  She is lost substantial weight.  Need to readdress her "dry weight".  Appears euvolemic at today's weight of 168 pounds. Anticoagulation: She has a history of hemopericardium, previous iron deficiency anemia due to lower GI bleeding, recurrent anemia following pelvic fracture.  I think Watchman would be a good idea. CAD: Asymptomatic.  Low risk recent nuclear stress test. HTN: Very well controlled. HLP: Excellent LDL cholesterol on statin DM: Adequate and improving glycemic control. Periodic limb movement syndrome: Saw Dr. Brett Fairy for sleep clinic evaluation in March  2021.  On Requip.  COPD: Saw Dr. Annamaria Boots in January 2022.  FEV1 not too bad at 1.66 L / 82% of predicted. Aortic atherosclerosis: Aortic calcification with normal caliber aorta seen on chest x-ray and chest CT. continue statin therapy.  Medication Adjustments/Labs and Tests Ordered: Current medicines are reviewed at length with the patient today.  Concerns regarding medicines are outlined  above.  No orders of the defined types were placed in this encounter.  Meds ordered this encounter  Medications   XARELTO 20 MG TABS tablet    Sig: Take 1 tablet (20 mg total) by mouth daily.    Dispense:  30 tablet    Refill:  11   torsemide (DEMADEX) 20 MG tablet    Sig: Take 2 tablets (40 mg total) by mouth once for 1 dose.    Dispense:  360 tablet    Refill:  0     Patient Instructions  Medication Instructions:  No changes *If you need a refill on your cardiac medications before your next appointment, please call your pharmacy*   Lab Work: None ordered If you have labs (blood work) drawn today and your tests are completely normal, you will receive your results only by: [place] (if you have MyChart) OR A paper copy in the mail If you have any lab test that is abnormal or we need to change your treatment, we will call you to review the results.   Testing/Procedures: None ordered   Follow-Up: At [facility], you and your health needs are our priority.  As part of our continuing mission to provide you with exceptional heart care, we have created designated Provider Care Teams.  These Care Teams include your primary Cardiologist (physician) and Advanced Practice Providers (APPs -  Physician Assistants and Nurse Practitioners) who all work together to provide you with the care you need, when you need it.  We recommend signing up for the patient portal called "MyChart".  Sign up information is provided on this After Visit Summary.  MyChart is used to connect with patients for Virtual Visits (Telemedicine).  Patients are able to view lab/test results, encounter notes, upcoming appointments, etc.  Non-urgent messages can be sent to your provider as well.   To learn more about what you can do with MyChart, go to [email or web address].    Your next appointment:   6 month(s)  The format for your next appointment:   In Person  Provider:   Sanda Klein, MD         Signed, Sanda Klein, MD  07/27/2021 3:50 PM    [place]

## 2021-07-26 NOTE — Patient Instructions (Signed)

## 2021-07-27 MED ORDER — METOPROLOL TARTRATE 25 MG PO TABS: 25.0000 mg | ORAL_TABLET | Freq: Every day | ORAL | Status: DC | PRN

## 2021-07-27 MED ORDER — TORSEMIDE 20 MG PO TABS: 40.0000 mg | ORAL_TABLET | Freq: Once | ORAL | 0 refills | Status: DC

## 2021-07-30 NOTE — Progress Notes (Signed)
°  Subjective:  Patient ID: [name], female    DOB: [birth year],  MRN: [id number]  [name] presents to clinic today for at risk foot care with history of diabetic neuropathy and painful elongated mycotic toenails 1-5 bilaterally which are tender when wearing enclosed shoe gear. Pain is relieved with periodic professional debridement.  Patient did not check blood glucose today.  She is accompanied by her wife, Edd Fabian, on today's visit. Ms. Therrell is having left hip surgery soon.   PCP is Hoyt Koch, MD , and last visit was 07/19/2021.  Allergies  Allergen Reactions   Benadryl [Diphenhydramine Hcl] Other (See Comments)    Restless leg   Clopidogrel Bisulfate     Other reaction(s): stomach upset   Ioversol    Benadryl [Diphenhydramine] Anxiety   Iodinated Diagnostic Agents Palpitations    Rapid heart rate, hot Other reaction(s): rapid heartbeat Other reaction(s): rapid heartbeat    Review of Systems: Negative except as noted in the HPI. Objective:   Constitutional THY GULLIKSON is a pleasant 70 y.o. Caucasian female, WD, WN in NAD. AAO x 3.   Vascular CFT <3 seconds b/l LE. Palpable DP pulse(s) b/l LE. Palpable PT pulse(s) b/l LE. Pedal hair sparse. No pain with calf compression b/l. Varicosities present b/l.  Neurologic Normal speech. Oriented to person, place, and time. Pt has subjective symptoms of neuropathy. Protective sensation intact 5/5 intact bilaterally with 10g monofilament b/l. Vibratory sensation intact b/l.  Dermatologic Pedal skin is warm and supple b/l LE. No open wounds b/l LE. No interdigital macerations noted b/l LE. Toenails 2-5 bilaterally elongated, discolored, dystrophic, thickened, and crumbly with subungual debris and tenderness to dorsal palpation. Anonychia noted bilateral great toes. Nailbed(s) epithelialized.   Orthopedic: Muscle strength 5/5 to all lower extremity muscle groups bilaterally. HAV with bunion deformity noted b/l LE. Pes  planus deformity noted bilateral LE. Utilizes transport chair for ambulation assistance.   Radiographs: None  Last A1c:  Hemoglobin A1C Latest Ref Rng & Units 02/28/2021 01/26/2021 09/23/2020  HGBA1C 4.8 - 5.6 % 7.1(H) 7.7(H) 7.3(H)  Some recent data might be hidden   Assessment:   1. Pain due to onychomycosis of toenails of both feet   2. Diabetic polyneuropathy associated with type 2 diabetes mellitus ([place])    Plan:  Patient was evaluated and treated and all questions answered. Consent given for treatment as described below: -Continue foot and shoe inspections daily. Monitor blood glucose per PCP/Endocrinologist's recommendations. -Patient to continue soft, supportive shoe gear daily. -Mycotic toenails 2-5 bilaterally were debrided in length and girth with sterile nail nippers and dremel without iatrogenic bleeding. -Patient/POA to call should there be question/concern in the interim.  Return in about 3 months (around 10/23/2021).  Marzetta Board, DPM

## 2021-08-01 ENCOUNTER — Other Ambulatory Visit: Payer: Self-pay | Admitting: Internal Medicine

## 2021-08-10 DIAGNOSIS — M1612 Unilateral primary osteoarthritis, left hip: Secondary | ICD-10-CM | POA: Diagnosis not present

## 2021-08-11 ENCOUNTER — Other Ambulatory Visit: Payer: Self-pay | Admitting: Internal Medicine

## 2021-08-17 ENCOUNTER — Telehealth: Payer: Self-pay | Admitting: Cardiovascular Disease

## 2021-08-17 ENCOUNTER — Telehealth: Payer: Self-pay

## 2021-08-17 NOTE — Telephone Encounter (Signed)
° °  Pt c/o medication issue:  1. Name of Medication: amoxicillin  2. How are you currently taking this medication (dosage and times per day)?   3. Are you having a reaction (difficulty breathing--STAT)?   4. What is your medication issue? Pt said Dr. Loletha Grayer always prescribed her amoxicillin if she has dental procedure. She is requesting for amoxicillin prescription sent to her pharmacy

## 2021-08-17 NOTE — Telephone Encounter (Signed)
Pt wants to let Dr. Sharlet Salina know that she has a surgery date for  hip replacement(Lt) for 09/25/21.  Pt have a Rx request for: HYDROcodone-acetaminophen (NORCO/VICODIN) 5-325 MG tablet.  Pt only has 2 days left.    Pharmacy: [facility]

## 2021-08-18 MED ORDER — HYDROCODONE-ACETAMINOPHEN 5-325 MG PO TABS: 1.0000 | ORAL_TABLET | Freq: Two times a day (BID) | ORAL | 0 refills | Status: DC | PRN

## 2021-08-18 NOTE — Telephone Encounter (Signed)
See below

## 2021-08-18 NOTE — Telephone Encounter (Signed)
I do not see an indication for antibiotics prior to dental procedure, does not appear she has prosthetic material in her heart or prior endocarditis

## 2021-08-18 NOTE — Telephone Encounter (Signed)
Called patient, spoke to wife- advised that we did not see a need for abx prior. Patient wife verbalized understanding, thankful for call back.

## 2021-08-21 ENCOUNTER — Other Ambulatory Visit: Payer: Self-pay | Admitting: Orthopedic Surgery

## 2021-08-24 DIAGNOSIS — N390 Urinary tract infection, site not specified: Secondary | ICD-10-CM | POA: Diagnosis not present

## 2021-08-24 DIAGNOSIS — N760 Acute vaginitis: Secondary | ICD-10-CM | POA: Diagnosis not present

## 2021-08-24 DIAGNOSIS — N762 Acute vulvitis: Secondary | ICD-10-CM | POA: Diagnosis not present

## 2021-09-15 ENCOUNTER — Other Ambulatory Visit: Payer: Self-pay | Admitting: Pharmacy Technician

## 2021-09-15 ENCOUNTER — Other Ambulatory Visit: Payer: Self-pay | Admitting: Internal Medicine

## 2021-09-18 ENCOUNTER — Other Ambulatory Visit (HOSPITAL_COMMUNITY): Payer: Self-pay

## 2021-09-18 NOTE — Patient Instructions (Addendum)
DUE TO COVID-19 ONLY ONE VISITOR IS ALLOWED TO COME WITH YOU AND STAY IN THE WAITING ROOM ONLY DURING PRE OP AND PROCEDURE DAY OF SURGERY IF YOU ARE GOING HOME AFTER SURGERY. IF YOU ARE SPENDING THE NIGHT 2 PEOPLE MAY VISIT WITH YOU IN YOUR PRIVATE ROOM AFTER SURGERY UNTIL VISITING  HOURS ARE OVER AT 8:00 PM AND 1  VISITOR  MAY  SPEND THE NIGHT.   YOU NEED TO HAVE A COVID 19 TEST ON__2/9/23_____ @_9 :15______, THIS TEST MUST BE DONE BEFORE SURGERY,                 [name]     Your procedure is scheduled on: 09/25/21   Report to [facility] Main  Entrance   Report to admitting at  6:10 AM     Call this number if you have problems the morning of surgery [phone]   No food after midnight.    You may have clear liquid until 6:30 AM.    At 6:15 AM drink pre surgery drink.  G2   Nothing by mouth after 6:30 AM.   CLEAR LIQUID DIET   Foods Allowed                                                                     Foods Excluded  Coffee and tea, regular and decaf                             liquids that you cannot  Plain Jell-O any favor except red or purple                                           see through such as: Fruit ices (not with fruit pulp)                                     milk, soups, orange juice  Iced Popsicles                                    All solid food Carbonated beverages, regular and diet                                    Cranberry, grape and apple juices Sports drinks like Gatorade Lightly seasoned clear broth or consume(fat free) Sugar     BRUSH YOUR TEETH MORNING OF SURGERY AND RINSE YOUR MOUTH OUT, NO CHEWING GUM CANDY OR MINTS.     Take these medicines the morning of surgery with A SIP OF WATER: Gabapentin, Paroxetine, Requip, Metoprolol, Diltiazem, Famotidine  Stop taking _Xarelto__________on __________as instructed by _____________.   Contact your Surgeon/Cardiologist for instructions on Anticoagulant Therapy prior to  surgery.    How to Manage Your Diabetes Before and After Surgery  Be strict with your diabetic diet before surgery  Why is it important to  control my blood sugar before and after surgery? Improving blood sugar levels before and after surgery helps healing and can limit problems. A way of improving blood sugar control is eating a healthy diet by:  Eating less sugar and carbohydrates  Increasing activity/exercise  Talking with your doctor about reaching your blood sugar goals High blood sugars (greater than 180 mg/dL) can raise your risk of infections and slow your recovery, so you will need to focus on controlling your diabetes during the weeks before surgery. Make sure that the doctor who takes care of your diabetes knows about your planned surgery including the date and location.  How do I manage my blood sugar before surgery? Check your blood sugar at least 4 times a day, starting 2 days before surgery, to make sure that the level is not too high or low. Check your blood sugar the morning of your surgery when you wake up and every 2 hours until you get to the Short Stay unit. If your blood sugar is less than 70 mg/dL, you will need to treat for low blood sugar: Do not take insulin. Treat a low blood sugar (less than 70 mg/dL) with  cup of clear juice (cranberry or apple), 4 glucose tablets, OR glucose gel. Recheck blood sugar in 15 minutes after treatment (to make sure it is greater than 70 mg/dL). If your blood sugar is not greater than 70 mg/dL on recheck, call [phone] for further instructions. Report your blood sugar to the short stay nurse when you get to Short Stay.  If you are admitted to the hospital after surgery: Your blood sugar will be checked by the staff and you will probably be given insulin after surgery (instead of oral diabetes medicines) to make sure you have good blood sugar levels. The goal for blood sugar control after surgery is 80-180 mg/dL.   WHAT DO I  DO ABOUT MY DIABETES MEDICATION?  Do not take your oral diabetic pills the day before surgery  Do not take oral diabetes medicines (pills) the morning of surgery.                                   You may not have any metal on your body including hair pins and              piercings  Do not wear jewelry, make-up, lotions, powders or perfumes, deodorant             Do not wear nail polish on your fingernails.  Do not shave  48 hours prior to surgery.                 Do not bring valuables to the hospital. [place].  Contacts, dentures or bridgework may not be worn into surgery.  Leave suitcase in the car. After surgery it may be brought to your room.    _____________________________________________________________________             [facility] - Preparing for Surgery Before surgery, you can play an important role.  Because skin is not sterile, your skin needs to be as free of germs as possible.  You can reduce the number of germs on your skin by washing with CHG (chlorahexidine gluconate) soap before surgery.  CHG is an antiseptic cleaner which kills  germs and bonds with the skin to continue killing germs even after washing. Please DO NOT use if you have an allergy to CHG or antibacterial soaps.  If your skin becomes reddened/irritated stop using the CHG and inform your nurse when you arrive at Short Stay. Do not shave (including legs and underarms) for at least 48 hours prior to the first CHG shower.   Please follow these instructions carefully:  1.  Shower with CHG Soap the night before surgery and the  morning of Surgery.  2.  If you choose to wash your hair, wash your hair first as usual with your  normal  shampoo.  3.  After you shampoo, rinse your hair and body thoroughly to remove the  shampoo.                            4.  Use CHG as you would any other liquid soap.  You can apply chg directly  to the skin and wash                        Gently with a scrungie or clean washcloth.  5.  Apply the CHG Soap to your body ONLY FROM THE NECK DOWN.   Do not use on face/ open                           Wound or open sores. Avoid contact with eyes, ears mouth and genitals (private parts).                       Wash face,  Genitals (private parts) with your normal soap.             6.  Wash thoroughly, paying special attention to the area where your surgery  will be performed.  7.  Thoroughly rinse your body with warm water from the neck down.  8.  DO NOT shower/wash with your normal soap after using and rinsing off  the CHG Soap.                9.  Pat yourself dry with a clean towel.            10.  Wear clean pajamas.            11.  Place clean sheets on your bed the night of your first shower and do not  sleep with pets. Day of Surgery : Do not apply any lotions/deodorants the morning of surgery.  Please wear clean clothes to the hospital/surgery center.  FAILURE TO FOLLOW THESE INSTRUCTIONS MAY RESULT IN THE CANCELLATION OF YOUR SURGERY  ________________________________________________________________________   Incentive Spirometer  An incentive spirometer is a tool that can help keep your lungs clear and active. This tool measures how well you are filling your lungs with each breath. Taking long deep breaths may help reverse or decrease the chance of developing breathing (pulmonary) problems (especially infection) following: A long period of time when you are unable to move or be active. BEFORE THE PROCEDURE  If the spirometer includes an indicator to show your best effort, your nurse or respiratory therapist will set it to a desired goal. If possible, sit up straight or lean slightly forward. Try not to slouch. Hold the incentive spirometer in an upright position. INSTRUCTIONS FOR USE  Sit on the edge of your bed  if possible, or sit up as far as you can in bed or on a chair. Hold the incentive spirometer in an  upright position. Breathe out normally. Place the mouthpiece in your mouth and seal your lips tightly around it. Breathe in slowly and as deeply as possible, raising the piston or the ball toward the top of the column. Hold your breath for 3-5 seconds or for as long as possible. Allow the piston or ball to fall to the bottom of the column. Remove the mouthpiece from your mouth and breathe out normally. Rest for a few seconds and repeat Steps 1 through 7 at least 10 times every 1-2 hours when you are awake. Take your time and take a few normal breaths between deep breaths. The spirometer may include an indicator to show your best effort. Use the indicator as a goal to work toward during each repetition. After each set of 10 deep breaths, practice coughing to be sure your lungs are clear. If you have an incision (the cut made at the time of surgery), support your incision when coughing by placing a pillow or rolled up towels firmly against it. Once you are able to get out of bed, walk around indoors and cough well. You may stop using the incentive spirometer when instructed by your caregiver.  RISKS AND COMPLICATIONS Take your time so you do not get dizzy or light-headed. If you are in pain, you may need to take or ask for pain medication before doing incentive spirometry. It is harder to take a deep breath if you are having pain. AFTER USE Rest and breathe slowly and easily. It can be helpful to keep track of a log of your progress. Your caregiver can provide you with a simple table to help with this. If you are using the spirometer at home, follow these instructions: [place] IF:  You are having difficultly using the spirometer. You have trouble using the spirometer as often as instructed. Your pain medication is not giving enough relief while using the spirometer. You develop fever of 100.5 F (38.1 C) or higher. SEEK IMMEDIATE MEDICAL CARE IF:  You cough up bloody sputum that had  not been present before. You develop fever of 102 F (38.9 C) or greater. You develop worsening pain at or near the incision site. MAKE SURE YOU:  Understand these instructions. Will watch your condition. Will get help right away if you are not doing well or get worse. Document Released: 12/10/2006 Document Revised: 10/22/2011 Document Reviewed: 02/10/2007 [facility] Patient Information 2014 [place], [place].   ________________________________________________________________________

## 2021-09-19 ENCOUNTER — Encounter (HOSPITAL_COMMUNITY): Payer: Self-pay

## 2021-09-19 ENCOUNTER — Other Ambulatory Visit: Payer: Self-pay

## 2021-09-19 ENCOUNTER — Encounter (HOSPITAL_COMMUNITY)
Admission: RE | Admit: 2021-09-19 | Discharge: 2021-09-19 | Disposition: A | Discharge status: Home / Self Care | Payer: HMO | Source: Ambulatory Visit | Attending: Orthopedic Surgery | Admitting: Orthopedic Surgery

## 2021-09-19 ENCOUNTER — Encounter: Payer: Self-pay | Admitting: Internal Medicine

## 2021-09-19 VITALS — BP 140/65 | HR 61 | Temp 97.6°F | Resp 20 | Ht 62.0 in | Wt 164.0 lb

## 2021-09-19 DIAGNOSIS — K219 Gastro-esophageal reflux disease without esophagitis: Secondary | ICD-10-CM | POA: Diagnosis not present

## 2021-09-19 DIAGNOSIS — I509 Heart failure, unspecified: Secondary | ICD-10-CM | POA: Diagnosis not present

## 2021-09-19 DIAGNOSIS — E1169 Type 2 diabetes mellitus with other specified complication: Secondary | ICD-10-CM | POA: Diagnosis not present

## 2021-09-19 DIAGNOSIS — Z7901 Long term (current) use of anticoagulants: Secondary | ICD-10-CM | POA: Insufficient documentation

## 2021-09-19 DIAGNOSIS — X58XXXA Exposure to other specified factors, initial encounter: Secondary | ICD-10-CM | POA: Diagnosis not present

## 2021-09-19 DIAGNOSIS — M1612 Unilateral primary osteoarthritis, left hip: Secondary | ICD-10-CM | POA: Insufficient documentation

## 2021-09-19 DIAGNOSIS — Z955 Presence of coronary angioplasty implant and graft: Secondary | ICD-10-CM | POA: Insufficient documentation

## 2021-09-19 DIAGNOSIS — S32402A Unspecified fracture of left acetabulum, initial encounter for closed fracture: Secondary | ICD-10-CM | POA: Diagnosis not present

## 2021-09-19 DIAGNOSIS — I110 Hypertensive heart disease with heart failure: Secondary | ICD-10-CM | POA: Diagnosis not present

## 2021-09-19 DIAGNOSIS — Z01818 Encounter for other preprocedural examination: Secondary | ICD-10-CM | POA: Insufficient documentation

## 2021-09-19 DIAGNOSIS — Z87891 Personal history of nicotine dependence: Secondary | ICD-10-CM | POA: Diagnosis not present

## 2021-09-19 DIAGNOSIS — E1136 Type 2 diabetes mellitus with diabetic cataract: Secondary | ICD-10-CM | POA: Diagnosis not present

## 2021-09-19 DIAGNOSIS — J449 Chronic obstructive pulmonary disease, unspecified: Secondary | ICD-10-CM | POA: Insufficient documentation

## 2021-09-19 DIAGNOSIS — I2510 Atherosclerotic heart disease of native coronary artery without angina pectoris: Secondary | ICD-10-CM | POA: Diagnosis not present

## 2021-09-19 DIAGNOSIS — I4891 Unspecified atrial fibrillation: Secondary | ICD-10-CM | POA: Diagnosis not present

## 2021-09-19 LAB — CBC
HCT: 39.4 % (ref 36.0–46.0)
Hemoglobin: 12.8 g/dL (ref 12.0–15.0)
MCH: 27.8 pg (ref 26.0–34.0)
MCHC: 32.5 g/dL (ref 30.0–36.0)
MCV: 85.7 fL (ref 80.0–100.0)
Platelets: 192 10*3/uL (ref 150–400)
RBC: 4.6 MIL/uL (ref 3.87–5.11)
RDW: 18.5 % — ABNORMAL HIGH (ref 11.5–15.5)
WBC: 8 10*3/uL (ref 4.0–10.5)
nRBC: 0 % (ref 0.0–0.2)

## 2021-09-19 LAB — SURGICAL PCR SCREEN
MRSA, PCR: NEGATIVE
Staphylococcus aureus: NEGATIVE

## 2021-09-19 LAB — BASIC METABOLIC PANEL
Anion gap: 6 (ref 5–15)
BUN: 16 mg/dL (ref 8–23)
CO2: 28 mmol/L (ref 22–32)
Calcium: 8.9 mg/dL (ref 8.9–10.3)
Chloride: 100 mmol/L (ref 98–111)
Creatinine, Ser: 0.93 mg/dL (ref 0.44–1.00)
GFR, Estimated: >60 mL/min (ref >60)
Glucose, Bld: 124 mg/dL — ABNORMAL HIGH (ref 70–99)
Potassium: 4.3 mmol/L (ref 3.5–5.1)
Sodium: 134 mmol/L — ABNORMAL LOW (ref 135–145)

## 2021-09-19 LAB — HEMOGLOBIN A1C
Hgb A1c MFr Bld: 6 % — ABNORMAL HIGH (ref 4.8–5.6)
Mean Plasma Glucose: 125.5 mg/dL

## 2021-09-19 LAB — GLUCOSE, CAPILLARY: Glucose-Capillary: 132 mg/dL — ABNORMAL HIGH (ref 70–99)

## 2021-09-19 NOTE — Care Plan (Signed)
Ortho Bundle Case Management Note  Patient Details  Name: [name] MRN: [id number] Date of Birth: 1951/07/12  Spoke with patient's wife prior to surgery. She states that patient will return home with her assistance. They have all needed equipment. HHPT referral to [facility] and OPPT set up with [facility]. Patient and MD in agreement with plan. Choice offered               DME Arranged:    DME Agency:     HH Arranged:  PT HH Agency:     Additional Comments: Please contact me with any questions of if this plan should need to change.  Ladell Heads,  [facility]  [phone] 09/19/2021, 11:08 AM

## 2021-09-19 NOTE — Progress Notes (Signed)
COVID test-09/21/21 at 9:15   Bowel prep reminder:NA  PCP - Dr. Lesly Rubenstein Cardiologist - Dr. Jerilynn Mages. [place]  Chest x-ray - 11/03/20-epic EKG - 09/19/21-chart Stress Test - 2016 ECHO - 12/09/19-epic Cardiac Cath - 2009 Pacemaker/ICD device last checked:NA Myocardial perfusion imaging- 06/12/21-epic  Sleep Study - yes- negative results CPAP - no  Fasting Blood Sugar - 98-120 Checks Blood Sugar _____ times a day.  Every other day  Blood Thinner Instructions:Xarelto/ Dr. Sallyanne Kuster Aspirin Instructions:none yet . Wife will call Dr. Sallyanne Kuster today to ask Last Dose:  Anesthesia review: yes  Patient denies shortness of breath, fever, cough and chest pain at PAT appointment Pt fell in 02/2021 and spent 5 weeks in rehab.the hip has given out and causes pain. She has been debilitated and sedentary.She gets tired and SOB with activity. She has COPB and uses an inhaler.  Patient verbalized understanding of instructions that were given to them at the PAT appointment. Patient was also instructed that they will need to review over the PAT instructions again at home before surgery. yes

## 2021-09-20 ENCOUNTER — Encounter: Payer: Self-pay | Admitting: Cardiovascular Disease

## 2021-09-20 DIAGNOSIS — M1612 Unilateral primary osteoarthritis, left hip: Secondary | ICD-10-CM | POA: Diagnosis present

## 2021-09-20 NOTE — H&P (Signed)
TOTAL HIP ADMISSION H&P  Patient is admitted for left total hip arthroplasty.  Subjective:  Chief Complaint: left hip pain  HPI: [name], 71 y.o. female, has a history of pain and functional disability in the left hip(s) due to arthritis and patient has failed non-surgical conservative treatments for greater than 12 weeks to include NSAID's and/or analgesics, use of assistive devices, weight reduction as appropriate, and activity modification.  Onset of symptoms was gradual starting  several  years ago with gradually worsening course since that time.The patient noted  prior trauma with ct scan showing malunion of posterior wall  on the left hip(s).  Patient currently rates pain in the left hip at 10 out of 10 with activity. Patient has night pain, worsening of pain with activity and weight bearing, trendelenberg gait, and pain that interfers with activities of daily living. Patient has evidence of joint space narrowing by imaging studies. This condition presents safety issues increasing the risk of falls.  There is no current active infection.  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   Lumbar radiculitis 12/16/2020   Weakness of both lower extremities 09/23/2020   Abnormal feces 09/19/2020   Family history of malignant neoplasm of gastrointestinal tract 09/19/2020   Chronic respiratory failure with hypoxia ([place]) 09/01/2020   Nocturnal hypoxemia 04/05/2020   Deep vein thrombosis (DVT) of calf muscle vein of left lower extremity ([place]) 11/02/2019   Obesity hypoventilation  syndrome ([place]) 11/02/2019   Coagulopathy ([place]) 11/02/2019   Coronary artery disease involving native coronary artery of native heart with angina pectoris ([place]) 11/02/2019   Bradycardia 01/12/2019   Pericardial effusion 01/12/2019   Diarrhea 01/12/2019   CHF (congestive heart failure) ([place]) 01/11/2019   Hyponatremia 12/30/2018   Permanent atrial fibrillation ([place]) 06/10/2018   Arthritis 10/25/2017   COPD mixed type ([place]) 10/25/2017   Fibrocystic disease of breast 10/25/2017   Gastroesophageal reflux disease 10/25/2017   Glaucoma 10/25/2017   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   Heart murmur 10/25/2017   Insomnia 10/25/2017   Osteoporosis 10/25/2017   RLS (restless legs syndrome) 04/23/2017   Routine general medical examination at a health care facility 11/16/2016   Diabetic polyneuropathy ([place]) 07/30/2016   Depression 01/04/2016   Obesity (BMI 30.0-34.9) 10/11/2015   CAD S/P percutaneous coronary angioplasty 04/28/2013   Essential hypertension 05/06/2009   LOW BACK PAIN, CHRONIC 02/12/2008   Anemia 08/15/2007   Hyperlipidemia associated with type 2 diabetes mellitus ([place]) 01/02/2007   ELEVATION, TRANSAMINASE/LDH LEVELS 01/02/2007   Past Medical History:  Diagnosis Date   Anemia, unspecified    Arthritis    Asthma    Atrial fibrillation with RVR ([place])    a. on Xarelto   Bell's palsy    Facial nerve decompression in 2001   CHF (congestive heart failure) (H[facility])    Chronic low back pain    COPD with asthma ([place])    Coronary artery disease    Myoview 04/12/11 was entirely normal. ECHO 02/26/08 showed only minor abnormalities. Stenting 05/26/08 of her posterolateral branch to the left circumflex coronary artery. Used a 2.5x22mm Taxus Monorail stent.myoview 2014 was without ischemia  Diabetes mellitus    Type 2   Early cataracts, bilateral    Fatty liver    GERD (gastroesophageal reflux disease)    Glaucoma    Glaucoma    Goiter    Heart murmur    History of nuclear stress test 2012;  2014   lexiscan; normal pattern of perfusion; normal, low risk scan    Hyperlipidemia    Hypertension    Panic disorder    Pneumonia 2008   Polycystic ovary disease    Hysterectomy in 1982 for this   Shortness of breath dyspnea    ECHO 02/26/08 showed only minor abnormalities   Spinal stenosis     Past Surgical History:  Procedure Laterality Date   ABDOMINAL HYSTERECTOMY  1982   & BSO; for polycystic ovary disease   CARDIOVERSION N/A 12/17/2013   Procedure: CARDIOVERSION;  Surgeon: Pixie Casino, MD;  Location: [facility];  Service: Cardiovascular;  Laterality: N/A;   CARDIOVERSION N/A 09/30/2015   Procedure: CARDIOVERSION;  Surgeon: Dorothy Spark, MD;  Location: [facility];  Service: Cardiovascular;  Laterality: N/A;   CARDIOVERSION N/A 06/22/2016   Procedure: CARDIOVERSION;  Surgeon: Skeet Latch, MD;  Location: [facility];  Service: Cardiovascular;  Laterality: N/A;   CENTRAL LINE INSERTION  01/13/2019   Procedure: CENTRAL LINE INSERTION;  Surgeon: Jolaine Artist, MD;  Location: [facility];  Service: Cardiovascular;;   COLONOSCOPY     last 2009; Dr Cristina Gong; due 2019   COLONOSCOPY WITH PROPOFOL N/A 01/23/2018   Procedure: COLONOSCOPY WITH PROPOFOL;  Surgeon: Ronald Lobo, MD;  Location: [facility];  Service: Endoscopy;  Laterality: N/A;   COLONOSCOPY WITH PROPOFOL N/A 11/04/2020   Procedure: COLONOSCOPY WITH PROPOFOL;  Surgeon: Ronald Lobo, MD;  Location: [facility];  Service: Endoscopy;  Laterality: N/A;   CORONARY ANGIOPLASTY  05/26/2008   Stenting of her posterolateral branch to the left circumflex coronary artery. Used a 2.5x44mm Taxus Monorail stent.   ESOPHAGOGASTRODUODENOSCOPY (EGD) WITH PROPOFOL N/A 01/23/2018   Procedure: ESOPHAGOGASTRODUODENOSCOPY (EGD) WITH PROPOFOL;  Surgeon: Ronald Lobo, MD;  Location: [facility];  Service: Endoscopy;  Laterality: N/A;   ESOPHAGOGASTRODUODENOSCOPY (EGD) WITH PROPOFOL N/A 11/04/2020   Procedure:  ESOPHAGOGASTRODUODENOSCOPY (EGD) WITH PROPOFOL;  Surgeon: Ronald Lobo, MD;  Location: [facility];  Service: Endoscopy;  Laterality: N/A;   FACIAL NERVE DECOMPRESSION  2001/2002   bells palsy    GIVENS CAPSULE STUDY N/A 11/04/2020   Procedure: GIVENS CAPSULE STUDY;  Surgeon: Ronald Lobo, MD;  Location: [facility];  Service: Endoscopy;  Laterality: N/A;   HOT HEMOSTASIS N/A 11/04/2020   Procedure: HOT HEMOSTASIS (ARGON PLASMA COAGULATION/BICAP);  Surgeon: Ronald Lobo, MD;  Location: [facility];  Service: Endoscopy;  Laterality: N/A;   LAPAROSCOPIC CHOLECYSTECTOMY  06/15/2011    Dr Dalbert Batman   PERICARDIOCENTESIS N/A 01/13/2019   Procedure: PERICARDIOCENTESIS;  Surgeon: Burnell Blanks, MD;  Location: [facility];  Service: Cardiovascular;  Laterality: N/A;   POLYPECTOMY  11/04/2020   Procedure: POLYPECTOMY;  Surgeon: Ronald Lobo, MD;  Location: [facility];  Service: Endoscopy;;   RIGHT AND LEFT HEART CATH N/A 01/13/2019   Procedure: RIGHT AND LEFT HEART CATH;  Surgeon: Jolaine Artist, MD;  Location: [facility];  Service: Cardiovascular;  Laterality: N/A;   TEE WITHOUT CARDIOVERSION N/A 12/17/2013   Procedure: TRANSESOPHAGEAL ECHOCARDIOGRAM (TEE);  Surgeon: Pixie Casino, MD;  Location: [facility] ENDOSCOPY;  Service: Cardiovascular;  Laterality: N/A;  trish/ja   TRANSTHORACIC ECHOCARDIOGRAM  11/13/4740   LV systolic function  normal with mild conc LVH; LA mildly dilated; trace MR/TR   UPPER GI ENDOSCOPY  2009   negative    No current facility-administered medications for this encounter.   Current Outpatient Medications  Medication Sig Dispense Refill Last Dose   ascorbic acid (VITAMIN C) 1000 MG tablet Take 1,000 mg by mouth daily.      celecoxib (CELEBREX) 200 MG capsule Take 200 mg by mouth daily.      colestipol (COLESTID) 1 g tablet Take 0.5 g by mouth 2 (two) times daily.      diltiazem (CARDIZEM CD) 180 MG 24 hr capsule TAKE 1 CAPSULE DAILY. 90 capsule  3    famotidine (PEPCID) 20 MG tablet Take 20 mg by mouth 2 (two) times daily.      gabapentin (NEURONTIN) 100 MG capsule TAKE ONE CAPSULE BY MOUTH THREE TIMES DAILY 180 capsule 0    guaiFENesin (MUCINEX) 600 MG 12 hr tablet Take 600 mg by mouth 2 (two) times daily.      INVOKANA 100 MG TABS tablet Take 100 mg by mouth every morning.      JANUMET 50-1000 MG tablet Take 0.5-1 tablets by mouth See admin instructions. Take 0.5 tablet in the morning and 1 tablet at bedtime      latanoprost (XALATAN) 0.005 % ophthalmic solution Place 1 drop into both eyes at bedtime.      LORazepam (ATIVAN) 0.5 MG tablet TAKE 1 OR 2 TABLETS AT BEDTIME. (Patient taking differently: Take 1 mg by mouth at bedtime.) 60 tablet 5    metoprolol succinate (TOPROL-XL) 100 MG 24 hr tablet Take 1 tablet (100 mg total) by mouth 2 (two) times daily. 180 tablet 3    metoprolol tartrate (LOPRESSOR) 25 MG tablet Take 1 tablet (25 mg total) by mouth daily as needed (if HR increases).      Multiple Minerals-Vitamins (CALCIUM CITRATE PLUS/MAGNESIUM PO) Take 400 mg by mouth daily.      nystatin (MYCOSTATIN/NYSTOP) powder Apply 1 application topically once a week. 60 g 6    nystatin ointment (MYCOSTATIN) Apply 1 application topically 2 (two) times daily as needed (IRRITATION). (Patient taking differently: Apply 1 application topically daily as needed (IRRITATION).) 100 g 6    PARoxetine (PAXIL) 10 MG tablet Take 2.5 tablets (25 mg total) by mouth every morning. 225 tablet 0    potassium chloride (MICRO-K) 10 MEQ CR capsule Take 1 capsule (10 mEq total) by mouth 2 (two) times daily. (Patient taking differently: Take 10 mEq by mouth at bedtime.) 180 capsule 2    quinapril (ACCUPRIL) 40 MG tablet TAKE 1 TABLET ONCE DAILY. (Patient taking differently: Take 20 mg by mouth at bedtime.) 90 tablet 1    rOPINIRole (REQUIP) 1 MG tablet Prn up to 3 tab a day- 90 tablet 5    rosuvastatin (CRESTOR) 10 MG tablet TAKE ONE TABLET AT BEDTIME. 90 tablet 3     senna (SENOKOT) 8.6 MG TABS Take 1 tablet by mouth at bedtime as needed for mild constipation.      traZODone (DESYREL) 100 MG tablet Up to 200 mg at bedtime for sleep (Patient taking differently: Take 150 mg by mouth at bedtime.) 180 tablet 3    umeclidinium-vilanterol (ANORO ELLIPTA) 62.5-25 MCG/ACT AEPB Inhale 1 puff into the lungs daily. 60 each 12    XARELTO 20 MG TABS tablet Take 1 tablet (20 mg total) by mouth daily. 30 tablet 11    glucose blood (FREESTYLE LITE) test strip Use as instructed 100 each  12    HYDROcodone-acetaminophen (NORCO/VICODIN) 5-325 MG tablet Take 1 tablet by mouth 2 (two) times daily as needed for moderate pain. 60 tablet 0    ONETOUCH DELICA LANCETS FINE MISC 1 Units by Does not apply route as directed. 100 each 3    torsemide (DEMADEX) 20 MG tablet Take 2 tablets (40 mg total) by mouth once for 1 dose. (Patient not taking: Reported on 09/15/2021) 360 tablet 0 Not Taking   Allergies  Allergen Reactions   Benadryl [Diphenhydramine Hcl] Other (See Comments)    Restless leg   Clopidogrel Bisulfate     Other reaction(s): stomach upset   Ioversol Other (See Comments)    Unknown   Benadryl [Diphenhydramine] Anxiety   Iodinated Contrast Media Palpitations    Rapid heart rate, hot Other reaction(s): rapid heartbeat     Social History   Tobacco Use   Smoking status: Former    Packs/day: 1.50    Years: 30.00    Pack years: 45.00    Types: Cigarettes    Quit date: 08/13/1993    Years since quitting: 28.1   Smokeless tobacco: Never  Substance Use Topics   Alcohol use: No    Family History  Problem Relation Age of Onset   Heart attack Father 66       2nd MI at 52   Colon cancer Brother 62   Gout Brother    Ulcers Mother    Emphysema Mother 96   Colon polyps Sister    Cancer Sister        Basal cell carcinoma   Pneumonia Maternal Grandmother    Hypertension Brother    Hyperlipidemia Brother    Cancer Brother        Skin   Diabetes Neg Hx    Stroke Neg  Hx      Review of Systems  Constitutional:  Positive for fatigue.  Respiratory:  Positive for shortness of breath.   Cardiovascular:  Positive for leg swelling.       Htn  Gastrointestinal:  Positive for diarrhea.  Genitourinary:  Positive for frequency.  Musculoskeletal:  Positive for arthralgias and myalgias.  Neurological:  Positive for dizziness and weakness.  Psychiatric/Behavioral:  Positive for sleep disturbance. The patient is nervous/anxious.    Objective:  Physical Exam Constitutional:      Appearance: She is obese.  HENT:     Head: Normocephalic and atraumatic.  Eyes:     Pupils: Pupils are equal, round, and reactive to light.  Cardiovascular:     Pulses: Normal pulses.  Pulmonary:     Effort: Pulmonary effort is normal.  Musculoskeletal:     Cervical back: Normal range of motion and neck supple.     Comments: Remains highly irritable to internal rotation foot tap is mildly positive.    Skin:    General: Skin is warm and dry.  Neurological:     General: No focal deficit present.     Mental Status: She is alert and oriented to person, place, and time. Mental status is at baseline.  Psychiatric:        Mood and Affect: Mood normal.        Behavior: Behavior normal.        Thought Content: Thought content normal.        Judgment: Judgment normal.    Vital signs in last 24 hours: Temp:  [97.6 F (36.4 C)] 97.6 F (36.4 C) (02/07 1457) Pulse Rate:  [61] 61 (02/07 1457) Resp:  [  20] 20 (02/07 1457) BP: (140)/(65) 140/65 (02/07 1457) SpO2:  [96 %] 96 % (02/07 1457) Weight:  [74.4 kg] 74.4 kg (02/07 1457)  Labs:   Estimated body mass index is 30 kg/m as calculated from the following:   Height as of 09/19/21: 5\' 2"  (1.575 m).   Weight as of 09/19/21: 74.4 kg.   Imaging Review the CT scan from October of this year showing the malunion of the posterior wall.    AP pelvis and crosstable lateral of the left hip continues show bone-on-bone arthritic changes no  significant change from x-rays were done a few months ago.  Assessment/Plan:  End stage arthritis, left hip(s)  The patient history, physical examination, clinical judgement of the provider and imaging studies are consistent with end stage degenerative joint disease of the left hip(s) and total hip arthroplasty is deemed medically necessary. The treatment options including medical management, injection therapy, arthroscopy and arthroplasty were discussed at length. The risks and benefits of total hip arthroplasty were presented and reviewed. The risks due to aseptic loosening, infection, stiffness, dislocation/subluxation,  thromboembolic complications and other imponderables were discussed.  The patient acknowledged the explanation, agreed to proceed with the plan and consent was signed. Patient is being admitted for inpatient treatment for surgery, pain control, PT, OT, prophylactic antibiotics, VTE prophylaxis, progressive ambulation and ADL's and discharge planning.The patient is planning to be discharged home with home health services   Anticipated LOS equal to or greater than 2 midnights due to - Age 54 and older with one or more of the following:  - Obesity  - Expected need for hospital services (PT, OT, Nursing) required for safe  discharge  - Anticipated need for postoperative skilled nursing care or inpatient rehab  - Active co-morbidities: Cardiac Arrhythmia and Respiratory Failure/COPD

## 2021-09-20 NOTE — Telephone Encounter (Signed)
error 

## 2021-09-21 ENCOUNTER — Encounter (HOSPITAL_COMMUNITY)
Admission: RE | Admit: 2021-09-21 | Discharge: 2021-09-21 | Disposition: A | Discharge status: Home / Self Care | Payer: HMO | Source: Ambulatory Visit | Attending: Orthopedic Surgery | Admitting: Orthopedic Surgery

## 2021-09-21 ENCOUNTER — Other Ambulatory Visit: Payer: Self-pay

## 2021-09-21 DIAGNOSIS — Z20822 Contact with and (suspected) exposure to covid-19: Secondary | ICD-10-CM | POA: Diagnosis not present

## 2021-09-21 DIAGNOSIS — Z01812 Encounter for preprocedural laboratory examination: Secondary | ICD-10-CM | POA: Insufficient documentation

## 2021-09-21 DIAGNOSIS — Z01818 Encounter for other preprocedural examination: Secondary | ICD-10-CM

## 2021-09-21 LAB — SARS CORONAVIRUS 2 (TAT 6-24 HRS): SARS Coronavirus 2: NEGATIVE

## 2021-09-21 NOTE — Progress Notes (Addendum)
Anesthesia Chart Review   Case: [id number] Date/Time: 09/25/21 0940   Procedure: LEFT TOTAL HIP ARTHROPLASTY ANTERIOR APPROACH (Left: Hip)   Anesthesia type: Spinal   Pre-op diagnosis: LEFT HIP OSTEOARTHRITIS AND ACETAB FRACTURE MALUNION   Location: [facility] ROOM 07 / [facility]   Surgeons: Frederik Pear, MD       DISCUSSION:71 y.o. former smoker with h/o GERD, HTN, COPD, CAD (posterior lateral ventricular branch stent in 2009), pericardiocentesis for hemothorax with tamponade in June 2020, atrial fibrillation (Xarelto), DM II, left hip OA and acetabular fracture scheduled for above procedure 09/25/2021 with Dr. Frederik Pear.   Pt last seen by cardiology 07/26/2021. Afib rate controlled, pt clinically euvolemic, no cv sx.  6 month follow up recommended.   Per cardiology note 06/14/21, "Patient had a negative stress test on June 12, 2021. She is optimized from a cardiovascular standpoint to proceed with hip surgery. She can hold Xarelto 3 days preop. Please resume therapeutic dose of Xarelto postop when deemed safe (stroke prevention with her A-fib)."  Anticipate pt can proceed with planned procedure barring acute status change.   VS: BP 140/65    Pulse 61    Temp 36.4 C (Oral)    Resp 20    Ht 5\' 2"  (1.575 m)    Wt 74.4 kg    SpO2 96%    BMI 30.00 kg/m   PROVIDERS: Hoyt Koch, MD is PCP   Sanda Klein, MD is Cardiologist  LABS: Labs reviewed: Acceptable for surgery. (all labs ordered are listed, but only abnormal results are displayed)  Labs Reviewed  HEMOGLOBIN A1C - Abnormal; Notable for the following components:      Result Value   Hgb A1c MFr Bld 6.0 (*)    All other components within normal limits  BASIC METABOLIC PANEL - Abnormal; Notable for the following components:   Sodium 134 (*)    Glucose, Bld 124 (*)    All other components within normal limits  CBC - Abnormal; Notable for the following components:   RDW 18.5 (*)    All other components within normal limits   GLUCOSE, CAPILLARY - Abnormal; Notable for the following components:   Glucose-Capillary 132 (*)    All other components within normal limits  SURGICAL PCR SCREEN  TYPE AND SCREEN     IMAGES:   EKG: 09/19/2021 Rate 64 bpm  Atrial fibrillation Left axis deviation Possible Anterior infarct , age undetermined Abnormal ECG When compared with ECG of 03-Nov-2020 10:01, No significant change since  CV: Myocardial Perfusion 06/12/2021   Findings are consistent with no prior ischemia and no prior myocardial infarction. The study is low risk.   No ST deviation was noted.   LV perfusion is normal. There is no evidence of ischemia. There is no evidence of infarction.   End diastolic cavity size is normal. End systolic cavity size is normal.   Normal perfusion EF may not be accurate due to afib estimated at 51% no RWMAls noted   Echo 12/09/2019  1. Abnormal septal motion and hypokinesis as well as inferior basal  hypokinesis EF similar to echo done 05/2019 or slightly better . Left  ventricular ejection fraction, by estimation, is 50 to 55%. The left  ventricle has low normal function. The left  ventricle demonstrates regional wall motion abnormalities (see scoring  diagram/findings for description). Left ventricular diastolic parameters  are indeterminate.   2. Right ventricular systolic function is normal. The right ventricular  size is normal.   3.  Left atrial size was moderately dilated.   4. Right atrial size was moderately dilated.   5. The mitral valve is degenerative. Trivial mitral valve regurgitation.  No evidence of mitral stenosis.   6. The aortic valve is tricuspid. Aortic valve regurgitation is trivial.  Mild to moderate aortic valve sclerosis/calcification is present, without  any evidence of aortic stenosis.   7. The inferior vena cava is normal in size with greater than 50%  respiratory variability, suggesting right atrial pressure of 3 mmHg.  Past Medical History:   Diagnosis Date   Anemia, unspecified    Arthritis    Asthma    Atrial fibrillation with RVR ([place])    a. on Xarelto   Bell's palsy    Facial nerve decompression in 2001   CHF (congestive heart failure) (HCC)    Chronic low back pain    COPD with asthma ([place])    Coronary artery disease    Myoview 04/12/11 was entirely normal. ECHO 02/26/08 showed only minor abnormalities. Stenting 05/26/08 of her posterolateral branch to the left circumflex coronary artery. Used a 2.5x37mm Taxus Monorail stent.myoview 2014 was without ischemia   Diabetes mellitus    Type 2   Early cataracts, bilateral    Fatty liver    GERD (gastroesophageal reflux disease)    Glaucoma    Glaucoma    Goiter    Heart murmur    History of nuclear stress test 2012; 2014   lexiscan; normal pattern of perfusion; normal, low risk scan    Hyperlipidemia    Hypertension    Panic disorder    Pneumonia 2008   Polycystic ovary disease    Hysterectomy in 1982 for this   Shortness of breath dyspnea    ECHO 02/26/08 showed only minor abnormalities   Spinal stenosis     Past Surgical History:  Procedure Laterality Date   ABDOMINAL HYSTERECTOMY  1982   & BSO; for polycystic ovary disease   CARDIOVERSION N/A 12/17/2013   Procedure: CARDIOVERSION;  Surgeon: Pixie Casino, MD;  Location: [place];  Service: Cardiovascular;  Laterality: N/A;   CARDIOVERSION N/A 09/30/2015   Procedure: CARDIOVERSION;  Surgeon: Dorothy Spark, MD;  Location: [place];  Service: Cardiovascular;  Laterality: N/A;   CARDIOVERSION N/A 06/22/2016   Procedure: CARDIOVERSION;  Surgeon: Skeet Latch, MD;  Location: [place];  Service: Cardiovascular;  Laterality: N/A;   CENTRAL LINE INSERTION  01/13/2019   Procedure: CENTRAL LINE INSERTION;  Surgeon: Jolaine Artist, MD;  Location: [place] [facility];  Service: Cardiovascular;;   COLONOSCOPY     last 2009; Dr Cristina Gong; due 2019   COLONOSCOPY WITH PROPOFOL N/A 01/23/2018    Procedure: COLONOSCOPY WITH PROPOFOL;  Surgeon: Ronald Lobo, MD;  Location: [place];  Service: Endoscopy;  Laterality: N/A;   COLONOSCOPY WITH PROPOFOL N/A 11/04/2020   Procedure: COLONOSCOPY WITH PROPOFOL;  Surgeon: Ronald Lobo, MD;  Location: [place];  Service: Endoscopy;  Laterality: N/A;   CORONARY ANGIOPLASTY  05/26/2008   Stenting of her posterolateral branch to the left circumflex coronary artery. Used a 2.5x76mm Taxus Monorail stent.   ESOPHAGOGASTRODUODENOSCOPY (EGD) WITH PROPOFOL N/A 01/23/2018   Procedure: ESOPHAGOGASTRODUODENOSCOPY (EGD) WITH PROPOFOL;  Surgeon: Ronald Lobo, MD;  Location: [place];  Service: Endoscopy;  Laterality: N/A;   ESOPHAGOGASTRODUODENOSCOPY (EGD) WITH PROPOFOL N/A 11/04/2020   Procedure: ESOPHAGOGASTRODUODENOSCOPY (EGD) WITH PROPOFOL;  Surgeon: Ronald Lobo, MD;  Location: [place];  Service: Endoscopy;  Laterality: N/A;   FACIAL NERVE DECOMPRESSION  2001/2002  bells palsy    GIVENS CAPSULE STUDY N/A 11/04/2020   Procedure: GIVENS CAPSULE STUDY;  Surgeon: Ronald Lobo, MD;  Location[facility];  Service: Endoscopy;  Laterality: N/A;   HOT HEMOSTASIS N/A 11/04/2020   Procedure: HOT HEMOSTASIS (ARGON PLASMA COAGULATION/BICAP);  Surgeon: Ronald Lobo, MD;  Location: [facility] ENDOSCOPY;  Service: Endoscopy;  Laterality: N/A;   LAPAROSCOPIC CHOLECYSTECTOMY  06/15/2011    Dr Dalbert Batman   PERICARDIOCENTESIS N/A 01/13/2019   Procedure: PERICARDIOCENTESIS;  Surgeon: Burnell Blanks, MD;  Location: [place] [facility];  Service: Cardiovascular;  Laterality: N/A;   POLYPECTOMY  11/04/2020   Procedure: POLYPECTOMY;  Surgeon: Ronald Lobo, MD;  Location: [place];  Service: Endoscopy;;   RIGHT AND LEFT HEART CATH N/A 01/13/2019   Procedure: RIGHT AND LEFT HEART CATH;  Surgeon: Jolaine Artist, MD;  Location: [place] [facility];  Service: Cardiovascular;  Laterality: N/A;   TEE WITHOUT CARDIOVERSION N/A 12/17/2013   Procedure:  TRANSESOPHAGEAL ECHOCARDIOGRAM (TEE);  Surgeon: Pixie Casino, MD;  Location: [facility] ENDOSCOPY;  Service: Cardiovascular;  Laterality: N/A;  trish/ja   TRANSTHORACIC ECHOCARDIOGRAM  2/99/3716   LV systolic function normal with mild conc LVH; LA mildly dilated; trace MR/TR   UPPER GI ENDOSCOPY  2009   negative    MEDICATIONS:  ascorbic acid (VITAMIN C) 1000 MG tablet   celecoxib (CELEBREX) 200 MG capsule   colestipol (COLESTID) 1 g tablet   diltiazem (CARDIZEM CD) 180 MG 24 hr capsule   famotidine (PEPCID) 20 MG tablet   gabapentin (NEURONTIN) 100 MG capsule   glucose blood (FREESTYLE LITE) test strip   guaiFENesin (MUCINEX) 600 MG 12 hr tablet   HYDROcodone-acetaminophen (NORCO/VICODIN) 5-325 MG tablet   INVOKANA 100 MG TABS tablet   JANUMET 50-1000 MG tablet   latanoprost (XALATAN) 0.005 % ophthalmic solution   LORazepam (ATIVAN) 0.5 MG tablet   metoprolol succinate (TOPROL-XL) 100 MG 24 hr tablet   metoprolol tartrate (LOPRESSOR) 25 MG tablet   Multiple Minerals-Vitamins (CALCIUM CITRATE PLUS/MAGNESIUM PO)   nystatin (MYCOSTATIN/NYSTOP) powder   nystatin ointment (MYCOSTATIN)   ONETOUCH DELICA LANCETS FINE MISC   PARoxetine (PAXIL) 10 MG tablet   potassium chloride (MICRO-K) 10 MEQ CR capsule   quinapril (ACCUPRIL) 40 MG tablet   rOPINIRole (REQUIP) 1 MG tablet   rosuvastatin (CRESTOR) 10 MG tablet   senna (SENOKOT) 8.6 MG TABS   torsemide (DEMADEX) 20 MG tablet   traZODone (DESYREL) 100 MG tablet   umeclidinium-vilanterol (ANORO ELLIPTA) 62.5-25 MCG/ACT AEPB   XARELTO 20 MG TABS tablet   No current facility-administered medications for this encounter.     Konrad Felix Ward, PA-C [facility] [phone]

## 2021-09-25 ENCOUNTER — Inpatient Hospital Stay (HOSPITAL_COMMUNITY)
Admission: RE | Admit: 2021-09-25 | Discharge: 2021-09-26 | DRG: 470 | Disposition: A | Discharge status: Home Health | Payer: HMO | Attending: Orthopedic Surgery | Admitting: Orthopedic Surgery

## 2021-09-25 ENCOUNTER — Inpatient Hospital Stay (HOSPITAL_COMMUNITY): Payer: HMO | Admitting: Physician Assistant

## 2021-09-25 ENCOUNTER — Inpatient Hospital Stay (HOSPITAL_COMMUNITY): Payer: HMO | Admitting: Anesthesiology

## 2021-09-25 ENCOUNTER — Inpatient Hospital Stay (HOSPITAL_COMMUNITY): Payer: HMO

## 2021-09-25 ENCOUNTER — Encounter (HOSPITAL_COMMUNITY)
Admission: RE | Disposition: A | Discharge status: Home Health | Payer: Self-pay | Source: Home / Self Care | Attending: Orthopedic Surgery

## 2021-09-25 ENCOUNTER — Encounter (HOSPITAL_COMMUNITY): Payer: Self-pay | Admitting: Orthopedic Surgery

## 2021-09-25 ENCOUNTER — Other Ambulatory Visit: Payer: Self-pay

## 2021-09-25 DIAGNOSIS — M25752 Osteophyte, left hip: Secondary | ICD-10-CM | POA: Diagnosis not present

## 2021-09-25 DIAGNOSIS — Z86718 Personal history of other venous thrombosis and embolism: Secondary | ICD-10-CM | POA: Diagnosis not present

## 2021-09-25 DIAGNOSIS — M1612 Unilateral primary osteoarthritis, left hip: Secondary | ICD-10-CM

## 2021-09-25 DIAGNOSIS — G2581 Restless legs syndrome: Secondary | ICD-10-CM | POA: Diagnosis not present

## 2021-09-25 DIAGNOSIS — Z6830 Body mass index (BMI) 30.0-30.9, adult: Secondary | ICD-10-CM

## 2021-09-25 DIAGNOSIS — F32A Depression, unspecified: Secondary | ICD-10-CM | POA: Diagnosis present

## 2021-09-25 DIAGNOSIS — Z7951 Long term (current) use of inhaled steroids: Secondary | ICD-10-CM

## 2021-09-25 DIAGNOSIS — Z79899 Other long term (current) drug therapy: Secondary | ICD-10-CM

## 2021-09-25 DIAGNOSIS — Z7901 Long term (current) use of anticoagulants: Secondary | ICD-10-CM

## 2021-09-25 DIAGNOSIS — K760 Fatty (change of) liver, not elsewhere classified: Secondary | ICD-10-CM | POA: Diagnosis not present

## 2021-09-25 DIAGNOSIS — Z96642 Presence of left artificial hip joint: Secondary | ICD-10-CM

## 2021-09-25 DIAGNOSIS — I110 Hypertensive heart disease with heart failure: Secondary | ICD-10-CM | POA: Diagnosis not present

## 2021-09-25 DIAGNOSIS — I4821 Permanent atrial fibrillation: Secondary | ICD-10-CM | POA: Diagnosis not present

## 2021-09-25 DIAGNOSIS — I2510 Atherosclerotic heart disease of native coronary artery without angina pectoris: Secondary | ICD-10-CM

## 2021-09-25 DIAGNOSIS — I10 Essential (primary) hypertension: Secondary | ICD-10-CM | POA: Diagnosis not present

## 2021-09-25 DIAGNOSIS — I5032 Chronic diastolic (congestive) heart failure: Secondary | ICD-10-CM | POA: Diagnosis not present

## 2021-09-25 DIAGNOSIS — D62 Acute posthemorrhagic anemia: Secondary | ICD-10-CM | POA: Diagnosis not present

## 2021-09-25 DIAGNOSIS — S32402K Unspecified fracture of left acetabulum, subsequent encounter for fracture with nonunion: Secondary | ICD-10-CM | POA: Diagnosis not present

## 2021-09-25 DIAGNOSIS — Z955 Presence of coronary angioplasty implant and graft: Secondary | ICD-10-CM

## 2021-09-25 DIAGNOSIS — Z471 Aftercare following joint replacement surgery: Secondary | ICD-10-CM | POA: Diagnosis not present

## 2021-09-25 DIAGNOSIS — Z8249 Family history of ischemic heart disease and other diseases of the circulatory system: Secondary | ICD-10-CM

## 2021-09-25 DIAGNOSIS — E669 Obesity, unspecified: Secondary | ICD-10-CM | POA: Diagnosis present

## 2021-09-25 DIAGNOSIS — M810 Age-related osteoporosis without current pathological fracture: Secondary | ICD-10-CM | POA: Diagnosis present

## 2021-09-25 DIAGNOSIS — E1142 Type 2 diabetes mellitus with diabetic polyneuropathy: Secondary | ICD-10-CM | POA: Diagnosis not present

## 2021-09-25 DIAGNOSIS — Z90710 Acquired absence of both cervix and uterus: Secondary | ICD-10-CM | POA: Diagnosis not present

## 2021-09-25 DIAGNOSIS — Z7984 Long term (current) use of oral hypoglycemic drugs: Secondary | ICD-10-CM | POA: Diagnosis not present

## 2021-09-25 DIAGNOSIS — Z87891 Personal history of nicotine dependence: Secondary | ICD-10-CM

## 2021-09-25 DIAGNOSIS — E1169 Type 2 diabetes mellitus with other specified complication: Secondary | ICD-10-CM | POA: Diagnosis not present

## 2021-09-25 DIAGNOSIS — E785 Hyperlipidemia, unspecified: Secondary | ICD-10-CM | POA: Diagnosis not present

## 2021-09-25 DIAGNOSIS — Z825 Family history of asthma and other chronic lower respiratory diseases: Secondary | ICD-10-CM

## 2021-09-25 DIAGNOSIS — G8929 Other chronic pain: Secondary | ICD-10-CM | POA: Diagnosis not present

## 2021-09-25 DIAGNOSIS — J449 Chronic obstructive pulmonary disease, unspecified: Secondary | ICD-10-CM | POA: Diagnosis present

## 2021-09-25 DIAGNOSIS — Z800 Family history of malignant neoplasm of digestive organs: Secondary | ICD-10-CM

## 2021-09-25 DIAGNOSIS — K219 Gastro-esophageal reflux disease without esophagitis: Secondary | ICD-10-CM | POA: Diagnosis present

## 2021-09-25 DIAGNOSIS — S32402A Unspecified fracture of left acetabulum, initial encounter for closed fracture: Secondary | ICD-10-CM | POA: Diagnosis not present

## 2021-09-25 DIAGNOSIS — Z808 Family history of malignant neoplasm of other organs or systems: Secondary | ICD-10-CM

## 2021-09-25 DIAGNOSIS — F410 Panic disorder [episodic paroxysmal anxiety] without agoraphobia: Secondary | ICD-10-CM | POA: Diagnosis present

## 2021-09-25 DIAGNOSIS — Z419 Encounter for procedure for purposes other than remedying health state, unspecified: Secondary | ICD-10-CM

## 2021-09-25 HISTORY — PX: TOTAL HIP ARTHROPLASTY: SHX124

## 2021-09-25 LAB — HEMOGLOBIN A1C
Hgb A1c MFr Bld: 6.1 % — ABNORMAL HIGH (ref 4.8–5.6)
Mean Plasma Glucose: 128.37 mg/dL

## 2021-09-25 LAB — TYPE AND SCREEN
ABO/RH(D): A POS
Antibody Screen: NEGATIVE

## 2021-09-25 LAB — GLUCOSE, CAPILLARY
Glucose-Capillary: 118 mg/dL — ABNORMAL HIGH (ref 70–99)
Glucose-Capillary: 189 mg/dL — ABNORMAL HIGH (ref 70–99)
Glucose-Capillary: 201 mg/dL — ABNORMAL HIGH (ref 70–99)
Glucose-Capillary: 380 mg/dL — ABNORMAL HIGH (ref 70–99)

## 2021-09-25 SURGERY — ARTHROPLASTY, HIP, TOTAL, ANTERIOR APPROACH
Anesthesia: Spinal | Site: Hip | Laterality: Left

## 2021-09-25 MED ORDER — LACTATED RINGERS IV SOLN: INTRAVENOUS | Status: DC

## 2021-09-25 MED ORDER — ORAL CARE MOUTH RINSE: 15.0000 mL | Freq: Once | OROMUCOSAL | Status: AC

## 2021-09-25 MED ORDER — METFORMIN HCL 500 MG PO TABS
500.0000 mg | ORAL_TABLET | Freq: Every day | ORAL | Status: DC
  Administered 2021-09-26: 500 mg via ORAL
  Filled 2021-09-25: qty 1

## 2021-09-25 MED ORDER — OXYCODONE-ACETAMINOPHEN 5-325 MG PO TABS
1.0000 | ORAL_TABLET | ORAL | 0 refills | Status: DC | PRN
Start: 2021-09-25 — End: 2022-04-03

## 2021-09-25 MED ORDER — SODIUM CHLORIDE 0.9% FLUSH
INTRAVENOUS | Status: DC | PRN
  Administered 2021-09-25: 50 mL

## 2021-09-25 MED ORDER — METHOCARBAMOL 1000 MG/10ML IJ SOLN
500.0000 mg | Freq: Four times a day (QID) | INTRAVENOUS | Status: DC | PRN
  Filled 2021-09-25: qty 5

## 2021-09-25 MED ORDER — DEXAMETHASONE SODIUM PHOSPHATE 10 MG/ML IJ SOLN
INTRAMUSCULAR | Status: DC | PRN
Start: 2021-09-25 — End: 2021-09-25
  Administered 2021-09-25: 8 mg via INTRAVENOUS

## 2021-09-25 MED ORDER — BUPIVACAINE IN DEXTROSE 0.75-8.25 % IT SOLN
INTRATHECAL | Status: DC | PRN
  Administered 2021-09-25: 1.8 mL via INTRATHECAL

## 2021-09-25 MED ORDER — FLEET ENEMA 7-19 GM/118ML RE ENEM: 1.0000 | ENEMA | Freq: Once | RECTAL | Status: DC | PRN

## 2021-09-25 MED ORDER — VASOPRESSIN 20 UNIT/ML IV SOLN
INTRAVENOUS | Status: DC | PRN
Start: 2021-09-25 — End: 2021-09-25
  Administered 2021-09-25: 1 [IU] via INTRAVENOUS

## 2021-09-25 MED ORDER — TRAZODONE HCL 50 MG PO TABS
150.0000 mg | ORAL_TABLET | Freq: Every day | ORAL | Status: DC
  Administered 2021-09-25: 150 mg via ORAL
  Filled 2021-09-25: qty 1

## 2021-09-25 MED ORDER — LATANOPROST 0.005 % OP SOLN
1.0000 [drp] | Freq: Every day | OPHTHALMIC | Status: DC
Start: 2021-09-25 — End: 2021-09-26
  Administered 2021-09-25: 1 [drp] via OPHTHALMIC
  Filled 2021-09-25: qty 2.5

## 2021-09-25 MED ORDER — GUAIFENESIN ER 600 MG PO TB12
600.0000 mg | ORAL_TABLET | Freq: Two times a day (BID) | ORAL | Status: DC
  Administered 2021-09-25 – 2021-09-26 (×2): 600 mg via ORAL
  Filled 2021-09-25 (×2): qty 1

## 2021-09-25 MED ORDER — NYSTATIN 100000 UNIT/GM EX OINT
1.0000 "application " | TOPICAL_OINTMENT | Freq: Two times a day (BID) | CUTANEOUS | Status: DC | PRN
  Filled 2021-09-25: qty 15

## 2021-09-25 MED ORDER — TRANEXAMIC ACID 1000 MG/10ML IV SOLN
2000.0000 mg | INTRAVENOUS | Status: DC
  Filled 2021-09-25: qty 20

## 2021-09-25 MED ORDER — MIDAZOLAM HCL 2 MG/2ML IJ SOLN
INTRAMUSCULAR | Status: AC
  Filled 2021-09-25: qty 2

## 2021-09-25 MED ORDER — DILTIAZEM HCL ER COATED BEADS 180 MG PO CP24
180.0000 mg | ORAL_CAPSULE | Freq: Every day | ORAL | Status: DC
  Administered 2021-09-26: 180 mg via ORAL
  Filled 2021-09-25: qty 1

## 2021-09-25 MED ORDER — ACETAMINOPHEN 10 MG/ML IV SOLN: 1000.0000 mg | Freq: Once | INTRAVENOUS | Status: DC | PRN

## 2021-09-25 MED ORDER — PANTOPRAZOLE SODIUM 40 MG PO TBEC
40.0000 mg | DELAYED_RELEASE_TABLET | Freq: Every day | ORAL | Status: DC
  Administered 2021-09-25 – 2021-09-26 (×2): 40 mg via ORAL
  Filled 2021-09-25 (×2): qty 1

## 2021-09-25 MED ORDER — ACETAMINOPHEN 500 MG PO TABS
1000.0000 mg | ORAL_TABLET | Freq: Four times a day (QID) | ORAL | Status: AC
  Administered 2021-09-25 – 2021-09-26 (×4): 1000 mg via ORAL
  Filled 2021-09-25 (×4): qty 2

## 2021-09-25 MED ORDER — MENTHOL 3 MG MT LOZG: 1.0000 | LOZENGE | OROMUCOSAL | Status: DC | PRN

## 2021-09-25 MED ORDER — SENNA 8.6 MG PO TABS: 1.0000 | ORAL_TABLET | Freq: Every evening | ORAL | Status: DC | PRN

## 2021-09-25 MED ORDER — BISACODYL 5 MG PO TBEC: 5.0000 mg | DELAYED_RELEASE_TABLET | Freq: Every day | ORAL | Status: DC | PRN

## 2021-09-25 MED ORDER — TRANEXAMIC ACID-NACL 1000-0.7 MG/100ML-% IV SOLN
1000.0000 mg | INTRAVENOUS | Status: AC
  Administered 2021-09-25: 1000 mg via INTRAVENOUS
  Filled 2021-09-25: qty 100

## 2021-09-25 MED ORDER — POLYETHYLENE GLYCOL 3350 17 G PO PACK: 17.0000 g | PACK | Freq: Every day | ORAL | Status: DC | PRN

## 2021-09-25 MED ORDER — MIDAZOLAM HCL 5 MG/5ML IJ SOLN
INTRAMUSCULAR | Status: DC | PRN
  Administered 2021-09-25: 1 mg via INTRAVENOUS

## 2021-09-25 MED ORDER — METOCLOPRAMIDE HCL 5 MG PO TABS: 5.0000 mg | ORAL_TABLET | Freq: Three times a day (TID) | ORAL | Status: DC | PRN

## 2021-09-25 MED ORDER — LISINOPRIL 20 MG PO TABS
20.0000 mg | ORAL_TABLET | Freq: Every day | ORAL | Status: DC
  Administered 2021-09-25: 20 mg via ORAL
  Filled 2021-09-25: qty 1

## 2021-09-25 MED ORDER — LIDOCAINE HCL (PF) 2 % IJ SOLN
INTRAMUSCULAR | Status: AC
  Filled 2021-09-25: qty 5

## 2021-09-25 MED ORDER — ASCORBIC ACID 500 MG PO TABS
1000.0000 mg | ORAL_TABLET | Freq: Every day | ORAL | Status: DC
  Administered 2021-09-26: 1000 mg via ORAL
  Filled 2021-09-25: qty 2

## 2021-09-25 MED ORDER — PHENOL 1.4 % MT LIQD: 1.0000 | OROMUCOSAL | Status: DC | PRN

## 2021-09-25 MED ORDER — CHLORHEXIDINE GLUCONATE 0.12 % MT SOLN
15.0000 mL | Freq: Once | OROMUCOSAL | Status: AC
  Administered 2021-09-25: 15 mL via OROMUCOSAL

## 2021-09-25 MED ORDER — CANAGLIFLOZIN 100 MG PO TABS
100.0000 mg | ORAL_TABLET | Freq: Every morning | ORAL | Status: DC
  Administered 2021-09-26: 100 mg via ORAL
  Filled 2021-09-25: qty 1

## 2021-09-25 MED ORDER — OXYCODONE HCL 5 MG PO TABS
5.0000 mg | ORAL_TABLET | ORAL | Status: DC | PRN
  Administered 2021-09-25 – 2021-09-26 (×4): 5 mg via ORAL
  Filled 2021-09-25 (×4): qty 1

## 2021-09-25 MED ORDER — PAROXETINE HCL 20 MG PO TABS
25.0000 mg | ORAL_TABLET | Freq: Every day | ORAL | Status: DC
  Administered 2021-09-26: 25 mg via ORAL
  Filled 2021-09-25: qty 0.5

## 2021-09-25 MED ORDER — PHENYLEPHRINE HCL (PRESSORS) 10 MG/ML IV SOLN
INTRAVENOUS | Status: AC
  Filled 2021-09-25: qty 1

## 2021-09-25 MED ORDER — ROSUVASTATIN CALCIUM 10 MG PO TABS
10.0000 mg | ORAL_TABLET | Freq: Every day | ORAL | Status: DC
  Administered 2021-09-25: 10 mg via ORAL
  Filled 2021-09-25: qty 1

## 2021-09-25 MED ORDER — CEFAZOLIN SODIUM-DEXTROSE 2-4 GM/100ML-% IV SOLN
2.0000 g | INTRAVENOUS | Status: AC
  Administered 2021-09-25: 2 g via INTRAVENOUS
  Filled 2021-09-25: qty 100

## 2021-09-25 MED ORDER — TIZANIDINE HCL 2 MG PO TABS
2.0000 mg | ORAL_TABLET | Freq: Four times a day (QID) | ORAL | 0 refills | Status: DC | PRN
Start: 2021-09-25 — End: 2022-02-23

## 2021-09-25 MED ORDER — POVIDONE-IODINE 10 % EX SWAB: 2.0000 "application " | Freq: Once | CUTANEOUS | Status: DC

## 2021-09-25 MED ORDER — STERILE WATER FOR IRRIGATION IR SOLN
Status: DC | PRN
  Administered 2021-09-25: 2000 mL

## 2021-09-25 MED ORDER — BUPIVACAINE-EPINEPHRINE (PF) 0.25% -1:200000 IJ SOLN
INTRAMUSCULAR | Status: AC
  Filled 2021-09-25: qty 30

## 2021-09-25 MED ORDER — TRANEXAMIC ACID 1000 MG/10ML IV SOLN
INTRAVENOUS | Status: DC | PRN
  Administered 2021-09-25: 2000 mg via TOPICAL

## 2021-09-25 MED ORDER — PROPOFOL 500 MG/50ML IV EMUL
INTRAVENOUS | Status: DC | PRN
  Administered 2021-09-25: 50 ug/kg/min via INTRAVENOUS

## 2021-09-25 MED ORDER — QUINAPRIL HCL 10 MG PO TABS: 20.0000 mg | ORAL_TABLET | Freq: Every day | ORAL | Status: DC

## 2021-09-25 MED ORDER — LINAGLIPTIN 5 MG PO TABS
5.0000 mg | ORAL_TABLET | Freq: Every day | ORAL | Status: DC
  Administered 2021-09-26: 5 mg via ORAL
  Filled 2021-09-25: qty 1

## 2021-09-25 MED ORDER — DOCUSATE SODIUM 100 MG PO CAPS
100.0000 mg | ORAL_CAPSULE | Freq: Two times a day (BID) | ORAL | Status: DC
  Administered 2021-09-25 – 2021-09-26 (×2): 100 mg via ORAL
  Filled 2021-09-25 (×2): qty 1

## 2021-09-25 MED ORDER — ALBUMIN HUMAN 5 % IV SOLN
INTRAVENOUS | Status: AC
  Filled 2021-09-25: qty 250

## 2021-09-25 MED ORDER — OXYCODONE HCL 5 MG PO TABS: 10.0000 mg | ORAL_TABLET | ORAL | Status: DC | PRN

## 2021-09-25 MED ORDER — RIVAROXABAN 10 MG PO TABS
10.0000 mg | ORAL_TABLET | Freq: Every day | ORAL | Status: DC
  Administered 2021-09-26: 10 mg via ORAL
  Filled 2021-09-25: qty 1

## 2021-09-25 MED ORDER — SITAGLIPTIN PHOS-METFORMIN HCL 50-1000 MG PO TABS: 0.5000 | ORAL_TABLET | ORAL | Status: DC

## 2021-09-25 MED ORDER — CELECOXIB 200 MG PO CAPS
200.0000 mg | ORAL_CAPSULE | Freq: Every day | ORAL | Status: DC
  Administered 2021-09-25 – 2021-09-26 (×2): 200 mg via ORAL
  Filled 2021-09-25 (×2): qty 1

## 2021-09-25 MED ORDER — HYDROMORPHONE HCL 1 MG/ML IJ SOLN: 0.2500 mg | INTRAMUSCULAR | Status: DC | PRN

## 2021-09-25 MED ORDER — FENTANYL CITRATE (PF) 100 MCG/2ML IJ SOLN
INTRAMUSCULAR | Status: AC
  Filled 2021-09-25: qty 2

## 2021-09-25 MED ORDER — METOPROLOL SUCCINATE ER 50 MG PO TB24
100.0000 mg | ORAL_TABLET | Freq: Two times a day (BID) | ORAL | Status: DC
  Administered 2021-09-25 – 2021-09-26 (×2): 100 mg via ORAL
  Filled 2021-09-25 (×2): qty 2

## 2021-09-25 MED ORDER — ROPINIROLE HCL 1 MG PO TABS
1.0000 mg | ORAL_TABLET | Freq: Three times a day (TID) | ORAL | Status: DC | PRN
  Administered 2021-09-26: 1 mg via ORAL
  Filled 2021-09-25: qty 1

## 2021-09-25 MED ORDER — UMECLIDINIUM-VILANTEROL 62.5-25 MCG/ACT IN AEPB
1.0000 | INHALATION_SPRAY | Freq: Every day | RESPIRATORY_TRACT | Status: DC
  Administered 2021-09-26: 1 via RESPIRATORY_TRACT
  Filled 2021-09-25: qty 14

## 2021-09-25 MED ORDER — INSULIN ASPART 100 UNIT/ML IJ SOLN
0.0000 [IU] | Freq: Three times a day (TID) | INTRAMUSCULAR | Status: DC
  Administered 2021-09-25: 5 [IU] via SUBCUTANEOUS
  Administered 2021-09-26: 8 [IU] via SUBCUTANEOUS
  Administered 2021-09-26: 3 [IU] via SUBCUTANEOUS

## 2021-09-25 MED ORDER — 0.9 % SODIUM CHLORIDE (POUR BTL) OPTIME
TOPICAL | Status: DC | PRN
  Administered 2021-09-25: 1000 mL

## 2021-09-25 MED ORDER — PROPOFOL 1000 MG/100ML IV EMUL
INTRAVENOUS | Status: AC
  Filled 2021-09-25: qty 100

## 2021-09-25 MED ORDER — ONDANSETRON HCL 4 MG/2ML IJ SOLN
INTRAMUSCULAR | Status: DC | PRN
  Administered 2021-09-25: 4 mg via INTRAVENOUS

## 2021-09-25 MED ORDER — KCL IN DEXTROSE-NACL 20-5-0.45 MEQ/L-%-% IV SOLN
INTRAVENOUS | Status: DC
  Filled 2021-09-25 (×2): qty 1000

## 2021-09-25 MED ORDER — DEXAMETHASONE SODIUM PHOSPHATE 10 MG/ML IJ SOLN
INTRAMUSCULAR | Status: AC
  Filled 2021-09-25: qty 1

## 2021-09-25 MED ORDER — BUPIVACAINE LIPOSOME 1.3 % IJ SUSP
INTRAMUSCULAR | Status: AC
  Filled 2021-09-25: qty 10

## 2021-09-25 MED ORDER — LORAZEPAM 1 MG PO TABS
1.0000 mg | ORAL_TABLET | Freq: Every day | ORAL | Status: DC
  Administered 2021-09-25: 1 mg via ORAL
  Filled 2021-09-25: qty 1

## 2021-09-25 MED ORDER — FENTANYL CITRATE (PF) 100 MCG/2ML IJ SOLN
INTRAMUSCULAR | Status: DC | PRN
  Administered 2021-09-25 (×2): 50 ug via INTRAVENOUS

## 2021-09-25 MED ORDER — ACETAMINOPHEN 325 MG PO TABS: 325.0000 mg | ORAL_TABLET | Freq: Four times a day (QID) | ORAL | Status: DC | PRN

## 2021-09-25 MED ORDER — DEXAMETHASONE SODIUM PHOSPHATE 10 MG/ML IJ SOLN
10.0000 mg | Freq: Once | INTRAMUSCULAR | Status: AC
  Administered 2021-09-26: 10 mg via INTRAVENOUS
  Filled 2021-09-25: qty 1

## 2021-09-25 MED ORDER — METHOCARBAMOL 500 MG PO TABS
500.0000 mg | ORAL_TABLET | Freq: Four times a day (QID) | ORAL | Status: DC | PRN
  Administered 2021-09-26 (×2): 500 mg via ORAL
  Filled 2021-09-25 (×2): qty 1

## 2021-09-25 MED ORDER — LIDOCAINE HCL (CARDIAC) PF 100 MG/5ML IV SOSY
PREFILLED_SYRINGE | INTRAVENOUS | Status: DC | PRN
  Administered 2021-09-25: 40 mg via INTRAVENOUS

## 2021-09-25 MED ORDER — PHENYLEPHRINE HCL-NACL 20-0.9 MG/250ML-% IV SOLN
INTRAVENOUS | Status: DC | PRN
Start: 2021-09-25 — End: 2021-09-25
  Administered 2021-09-25: 30 ug/min via INTRAVENOUS

## 2021-09-25 MED ORDER — METFORMIN HCL 500 MG PO TABS: 1000.0000 mg | ORAL_TABLET | Freq: Every day | ORAL | Status: DC

## 2021-09-25 MED ORDER — BUPIVACAINE LIPOSOME 1.3 % IJ SUSP
INTRAMUSCULAR | Status: DC | PRN
  Administered 2021-09-25: 20 mL

## 2021-09-25 MED ORDER — METOPROLOL TARTRATE 25 MG PO TABS: 25.0000 mg | ORAL_TABLET | Freq: Every day | ORAL | Status: DC | PRN

## 2021-09-25 MED ORDER — GABAPENTIN 100 MG PO CAPS
100.0000 mg | ORAL_CAPSULE | Freq: Three times a day (TID) | ORAL | Status: DC
  Administered 2021-09-25 – 2021-09-26 (×3): 100 mg via ORAL
  Filled 2021-09-25 (×3): qty 1

## 2021-09-25 MED ORDER — ONDANSETRON HCL 4 MG PO TABS: 4.0000 mg | ORAL_TABLET | Freq: Four times a day (QID) | ORAL | Status: DC | PRN

## 2021-09-25 MED ORDER — TRANEXAMIC ACID-NACL 1000-0.7 MG/100ML-% IV SOLN
1000.0000 mg | Freq: Once | INTRAVENOUS | Status: AC
  Administered 2021-09-25: 1000 mg via INTRAVENOUS
  Filled 2021-09-25: qty 100

## 2021-09-25 MED ORDER — METOCLOPRAMIDE HCL 5 MG/ML IJ SOLN: 5.0000 mg | Freq: Three times a day (TID) | INTRAMUSCULAR | Status: DC | PRN

## 2021-09-25 MED ORDER — COLESTIPOL HCL 1 G PO TABS
0.5000 g | ORAL_TABLET | Freq: Two times a day (BID) | ORAL | Status: DC
  Administered 2021-09-25 – 2021-09-26 (×2): 0.5 g via ORAL
  Filled 2021-09-25 (×2): qty 1

## 2021-09-25 MED ORDER — HYDROMORPHONE HCL 1 MG/ML IJ SOLN: 0.5000 mg | INTRAMUSCULAR | Status: DC | PRN

## 2021-09-25 MED ORDER — RIVAROXABAN 10 MG PO TABS
10.0000 mg | ORAL_TABLET | Freq: Every day | ORAL | 0 refills | Status: DC
Start: 2021-09-25 — End: 2022-05-22

## 2021-09-25 MED ORDER — ONDANSETRON HCL 4 MG/2ML IJ SOLN: 4.0000 mg | Freq: Once | INTRAMUSCULAR | Status: DC | PRN

## 2021-09-25 MED ORDER — ONDANSETRON HCL 4 MG/2ML IJ SOLN: 4.0000 mg | Freq: Four times a day (QID) | INTRAMUSCULAR | Status: DC | PRN

## 2021-09-25 MED ORDER — BUPIVACAINE-EPINEPHRINE 0.25% -1:200000 IJ SOLN
INTRAMUSCULAR | Status: DC | PRN
  Administered 2021-09-25: 30 mL

## 2021-09-25 MED ORDER — ONDANSETRON HCL 4 MG/2ML IJ SOLN
INTRAMUSCULAR | Status: AC
  Filled 2021-09-25: qty 2

## 2021-09-25 MED ORDER — BUPIVACAINE LIPOSOME 1.3 % IJ SUSP: 10.0000 mL | Freq: Once | INTRAMUSCULAR | Status: DC

## 2021-09-25 SURGICAL SUPPLY — 43 items
BAG COUNTER SPONGE SURGICOUNT (BAG) ×3 IMPLANT
BAG DECANTER FOR FLEXI CONT (MISCELLANEOUS) ×6 IMPLANT
BAG SPNG CNTER NS LX DISP (BAG) ×1
BLADE SAW SGTL 18X1.27X75 (BLADE) ×3 IMPLANT
COVER PERINEAL POST (MISCELLANEOUS) ×3 IMPLANT
COVER SURGICAL LIGHT HANDLE (MISCELLANEOUS) ×3 IMPLANT
CUP SECTOR GRIPTON 50MM (Cup) ×1 IMPLANT
DRAPE STERI IOBAN 125X83 (DRAPES) ×3 IMPLANT
DRAPE U-SHAPE 47X51 STRL (DRAPES) ×6 IMPLANT
DRSG AQUACEL AG ADV 3.5X10 (GAUZE/BANDAGES/DRESSINGS) ×3 IMPLANT
DURAPREP 26ML APPLICATOR (WOUND CARE) ×3 IMPLANT
ELECT BLADE TIP CTD 4 INCH (ELECTRODE) ×3 IMPLANT
ELECT REM PT RETURN 15FT ADLT (MISCELLANEOUS) ×3 IMPLANT
GLOVE SRG 8 PF TXTR STRL LF DI (GLOVE) ×2 IMPLANT
GLOVE SURG ENC MOIS LTX SZ7.5 (GLOVE) ×3 IMPLANT
GLOVE SURG ENC MOIS LTX SZ8.5 (GLOVE) ×3 IMPLANT
GLOVE SURG UNDER POLY LF SZ8 (GLOVE) ×2
GLOVE SURG UNDER POLY LF SZ9 (GLOVE) ×3 IMPLANT
GOWN STRL REUS W/TWL XL LVL3 (GOWN DISPOSABLE) ×6 IMPLANT
HEAD FEMORAL 32 CERAMIC (Hips) ×1 IMPLANT
HOLDER FOLEY CATH W/STRAP (MISCELLANEOUS) ×3 IMPLANT
KIT TURNOVER KIT A (KITS) ×1 IMPLANT
LINER ACET PNNCL PLUS4 NEUTRAL (Hips) IMPLANT
MANIFOLD NEPTUNE II (INSTRUMENTS) ×3 IMPLANT
NDL HYPO 21X1.5 SAFETY (NEEDLE) ×4 IMPLANT
NEEDLE HYPO 21X1.5 SAFETY (NEEDLE) ×4 IMPLANT
NS IRRIG 1000ML POUR BTL (IV SOLUTION) ×3 IMPLANT
PACK ANTERIOR HIP CUSTOM (KITS) ×3 IMPLANT
PINNACLE PLUS 4 NEUTRAL (Hips) ×2 IMPLANT
SPIKE FLUID TRANSFER (MISCELLANEOUS) ×3 IMPLANT
STEM FEMORAL SZ 6MM STD ACTIS (Stem) ×1 IMPLANT
SUT ETHIBOND NAB CT1 #1 30IN (SUTURE) ×3 IMPLANT
SUT VIC AB 0 CT1 27 (SUTURE)
SUT VIC AB 0 CT1 27XBRD ANBCTR (SUTURE) IMPLANT
SUT VIC AB 1 CTX 36 (SUTURE) ×2
SUT VIC AB 1 CTX36XBRD ANBCTR (SUTURE) ×2 IMPLANT
SUT VIC AB 2-0 CT1 27 (SUTURE) ×2
SUT VIC AB 2-0 CT1 TAPERPNT 27 (SUTURE) ×2 IMPLANT
SUT VIC AB 3-0 CT1 27 (SUTURE) ×2
SUT VIC AB 3-0 CT1 TAPERPNT 27 (SUTURE) ×2 IMPLANT
SYR CONTROL 10ML LL (SYRINGE) ×9 IMPLANT
TRAY FOLEY MTR SLVR 16FR STAT (SET/KITS/TRAYS/PACK) IMPLANT
TRAY FOLEY SLVR 14FR TEMP STAT (SET/KITS/TRAYS/PACK) ×1 IMPLANT

## 2021-09-25 NOTE — Plan of Care (Signed)
?  Problem: Education: ?Goal: Knowledge of General Education information will improve ?Description: Including pain rating scale, medication(s)/side effects and non-pharmacologic comfort measures ?Outcome: Progressing ?  ?Problem: Activity: ?Goal: Risk for activity intolerance will decrease ?Outcome: Progressing ?  ?Problem: Nutrition: ?Goal: Adequate nutrition will be maintained ?Outcome: Progressing ?  ?Problem: Elimination: ?Goal: Will not experience complications related to bowel motility ?Outcome: Progressing ?  ?Problem: Pain Managment: ?Goal: General experience of comfort will improve ?Outcome: Progressing ?  ?Problem: Education: ?Goal: Knowledge of the prescribed therapeutic regimen will improve ?Outcome: Progressing ?  ?Problem: Activity: ?Goal: Ability to avoid complications of mobility impairment will improve ?Outcome: Progressing ?  ?Problem: Pain Management: ?Goal: Pain level will decrease with appropriate interventions ?Outcome: Progressing ?  ?

## 2021-09-25 NOTE — Anesthesia Preprocedure Evaluation (Addendum)
Anesthesia Evaluation  Patient identified by MRN, date of birth, ID band Patient awake    Reviewed: Allergy & Precautions, NPO status , Patient's Chart, lab work & pertinent test results, reviewed documented beta blocker date and time   Airway Mallampati: I       Dental  (+) Poor Dentition, Missing, Caps, Dental Advisory Given,    Pulmonary former smoker,    Pulmonary exam normal        Cardiovascular hypertension, Pt. on home beta blockers and Pt. on medications + CAD and + Cardiac Stents  Normal cardiovascular exam+ dysrhythmias Atrial Fibrillation      Neuro/Psych PSYCHIATRIC DISORDERS Anxiety Depression    GI/Hepatic   Endo/Other  diabetes, Type 2, Oral Hypoglycemic Agents  Renal/GU      Musculoskeletal   Abdominal (+) + obese,   Peds  Hematology negative hematology ROS (+)   Anesthesia Other Findings   Reproductive/Obstetrics                           Anesthesia Physical Anesthesia Plan  ASA: 3  Anesthesia Plan: Spinal   Post-op Pain Management:    Induction:   PONV Risk Score and Plan: 2 and Ondansetron  Airway Management Planned: Natural Airway and Simple Face Mask  Additional Equipment: None  Intra-op Plan:   Post-operative Plan:   Informed Consent: I have reviewed the patients History and Physical, chart, labs and discussed the procedure including the risks, benefits and alternatives for the proposed anesthesia with the patient or authorized representative who has indicated his/her understanding and acceptance.     Dental advisory given  Plan Discussed with: CRNA  Anesthesia Plan Comments:         Anesthesia Quick Evaluation                                  Anesthesia Evaluation  Patient identified by MRN, date of birth, ID band Patient awake    Reviewed: Allergy & Precautions, NPO status , Patient's Chart, lab work & pertinent test  results  Airway Mallampati: II  TM Distance: >3 FB Neck ROM: Full    Dental  (+) Poor Dentition Multiple decayed and broken teeth especially on the bottom; none particularly loose:   Pulmonary shortness of breath, asthma , COPD (does not use O2 or CPAP/BiPaP; patient states has had conflicting sleep studies in the past), former smoker,    Pulmonary exam normal breath sounds clear to auscultation       Cardiovascular hypertension, + CAD and +CHF  + dysrhythmias (on Xarelto) Atrial Fibrillation  Rhythm:Irregular Rate:Normal     Neuro/Psych  Headaches, PSYCHIATRIC DISORDERS Anxiety Depression  Neuromuscular disease (Bell's palsy; diabetic neuropathy)    GI/Hepatic GERD  ,  Endo/Other  diabetes  Renal/GU      Musculoskeletal  (+) Arthritis , Osteoarthritis,    Abdominal   Peds  Hematology  (+) anemia , H/h 8.1/25   Anesthesia Other Findings   Reproductive/Obstetrics                             Anesthesia Physical Anesthesia Plan  ASA: III  Anesthesia Plan: MAC   Post-op Pain Management:    Induction:   PONV Risk Score and Plan: 2 and Propofol infusion, TIVA and Treatment may vary due to age or medical condition  Airway Management Planned:  Natural Airway  Additional Equipment: None  Intra-op Plan:   Post-operative Plan:   Informed Consent: I have reviewed the patients History and Physical, chart, labs and discussed the procedure including the risks, benefits and alternatives for the proposed anesthesia with the patient or authorized representative who has indicated his/her understanding and acceptance.     Dental advisory given  Plan Discussed with: CRNA, Anesthesiologist and Surgeon  Anesthesia Plan Comments: (Discussed that her poor dentition puts her at high risk to have a tooth dislodged or broken due to the scope used by the endoscopy team. Norton Blizzard, MD  )        Anesthesia Quick Evaluation

## 2021-09-25 NOTE — Evaluation (Signed)
Physical Therapy Evaluation Patient Details Name: [name] MRN: [id number] DOB: [birth year] Today's Date: 09/25/2021  History of Present Illness  71 yo female s/p L DA THA. PMH: L humerus fx, pelvic fx, a fib, dCHF,  COPD, DM, HTN, spinal stenosis, pt reported remote L5 fx  Clinical Impression  Pt is s/p THA resulting in the deficits listed below (see PT Problem List).  PT doing well today, amb ~ 68' with RW and min/guard assist, slow but steady gait. Pt is motivated to d/c home tomorrow.   Pt will benefit from skilled PT to increase their independence and safety with mobility to allow discharge to the venue listed below.         Recommendations for follow up therapy are one component of a multi-disciplinary discharge planning process, led by the attending physician.  Recommendations may be updated based on patient status, additional functional criteria and insurance authorization.  Follow Up Recommendations Follow physician's recommendations for discharge plan and follow up therapies ([facility]PT)    Assistance Recommended at Discharge Intermittent Supervision/Assistance  Patient can return home with the following  Help with stairs or ramp for entrance;Assist for transportation    Equipment Recommendations None recommended by PT  Recommendations for Other Services       Functional Status Assessment Patient has had a recent decline in their functional status and demonstrates the ability to make significant improvements in function in a reasonable and predictable amount of time.     Precautions / Restrictions Precautions Precautions: None Restrictions Weight Bearing Restrictions: No Other Position/Activity Restrictions: WBAT      Mobility  Bed Mobility               General bed mobility comments: OOB to chair with nursing    Transfers Overall transfer level: Needs assistance Equipment used: Rolling walker (2 wheels) Transfers: Sit to/from Stand Sit to Stand: Min  guard           General transfer comment: cues for hand placement    Ambulation/Gait Ambulation/Gait assistance: Min assist, Min guard Gait Distance (Feet): 70 Feet Assistive device: Rolling walker (2 wheels) Gait Pattern/deviations: Step-to pattern, Decreased stance time - left       General Gait Details: cues for sequence and RW position from self  Stairs            Wheelchair Mobility    Modified Rankin (Stroke Patients Only)       Balance                                             Pertinent Vitals/Pain Pain Assessment Pain Assessment: Faces Faces Pain Scale: Hurts a little bit Pain Location: L hip Pain Descriptors / Indicators: Sore, Grimacing Pain Intervention(s): Repositioned, Premedicated before session, Monitored during session, Limited activity within patient's tolerance    Home Living Family/patient expects to be discharged to:: Private residence Living Arrangements: Spouse/significant other Available Help at Discharge: Family;Available 24 hours/day Type of Home: House Home Access: Level entry       Home Layout: One level (one step down to patio area) Home Equipment: Conservation officer, nature (2 wheels);Cane - single point;BSC/3in1 Additional Comments: will have assist as needed from spouse and dtrs; pt is an SLP    Prior Function Prior Level of Function : Independent/Modified Independent  Hand Dominance        Extremity/Trunk Assessment   Upper Extremity Assessment Upper Extremity Assessment: Overall WFL for tasks assessed    Lower Extremity Assessment Lower Extremity Assessment: LLE deficits/detail LLE Deficits / Details: grossly 2+/5 hip, knee and ankle grossly WFL       Communication   Communication: No difficulties  Cognition Arousal/Alertness: Awake/alert Behavior During Therapy: WFL for tasks assessed/performed Overall Cognitive Status: Within Functional Limits for tasks assessed                                           General Comments      Exercises Total Joint Exercises Ankle Circles/Pumps: AROM, Both, 5 reps   Assessment/Plan    PT Assessment Patient needs continued PT services  PT Problem List Decreased strength;Decreased mobility;Decreased range of motion;Decreased activity tolerance;Decreased balance;Decreased knowledge of use of DME;Pain       PT Treatment Interventions DME instruction;Therapeutic exercise;Gait training;Functional mobility training;Therapeutic activities;Patient/family education;Stair training    PT Goals (Current goals can be found in the Care Plan section)  Acute Rehab PT Goals Patient Stated Goal: home asap PT Goal Formulation: With patient Time For Goal Achievement: 10/02/21 Potential to Achieve Goals: Good    Frequency 7X/week     Co-evaluation               AM-PAC PT "6 Clicks" Mobility  Outcome Measure Help needed turning from your back to your side while in a flat bed without using bedrails?: A Little Help needed moving from lying on your back to sitting on the side of a flat bed without using bedrails?: A Little Help needed moving to and from a bed to a chair (including a wheelchair)?: A Little Help needed standing up from a chair using your arms (e.g., wheelchair or bedside chair)?: A Little Help needed to walk in hospital room?: A Little Help needed climbing 3-5 steps with a railing? : A Little 6 Click Score: 18    End of Session Equipment Utilized During Treatment: Gait belt Activity Tolerance: Patient tolerated treatment well Patient left: with call bell/phone within reach;in chair (no alarm box in room; pt in chair on arrival) Nurse Communication: Mobility status PT Visit Diagnosis: Other abnormalities of gait and mobility (R26.89);Difficulty in walking, not elsewhere classified (R26.2)    Time: 1324-4010 PT Time Calculation (min) (ACUTE ONLY): 33 min   Charges:   PT  Evaluation $PT Eval Low Complexity: 1 Low PT Treatments $Gait Training: 8-22 mins        Baxter Flattery, PT  Acute Rehab Dept ([place]) [phone] Pager [phone]  09/25/2021   [facility] 09/25/2021, 6:33 PM

## 2021-09-25 NOTE — Anesthesia Postprocedure Evaluation (Signed)
Anesthesia Post Note  Patient: [name]  Procedure(s) Performed: LEFT TOTAL HIP ARTHROPLASTY ANTERIOR APPROACH (Left: Hip)     Patient location during evaluation: PACU Anesthesia Type: Spinal Level of consciousness: awake Pain management: pain level controlled Vital Signs Assessment: post-procedure vital signs reviewed and stable Respiratory status: spontaneous breathing, nonlabored ventilation, respiratory function stable and patient connected to nasal cannula oxygen Cardiovascular status: stable and blood pressure returned to baseline Postop Assessment: no apparent nausea or vomiting Anesthetic complications: no   No notable events documented.  Last Vitals:  Vitals:   09/25/21 1520 09/25/21 1748  BP: (!) 144/66 (!) 145/73  Pulse: 72 88  Resp: 18 16  Temp: 36.6 C   SpO2: 98% 92%    Last Pain:  Vitals:   09/25/21 1730  TempSrc:   PainSc: 4                  Vieva Brummitt P Yunuen Mordan

## 2021-09-25 NOTE — Anesthesia Procedure Notes (Signed)
Spinal  Patient location during procedure: OR Start time: 09/25/2021 11:16 AM End time: 09/25/2021 11:20 AM Reason for block: surgical anesthesia Staffing Performed: resident/CRNA  Resident/CRNA: Garrel Ridgel, CRNA Preanesthetic Checklist Completed: patient identified, IV checked, site marked, risks and benefits discussed, surgical consent, monitors and equipment checked, pre-op evaluation and timeout performed Spinal Block Patient position: sitting Prep: DuraPrep Patient monitoring: heart rate, cardiac monitor, continuous pulse ox and blood pressure Approach: midline Location: L4-5 Injection technique: single-shot Needle Needle type: Pencan  Needle gauge: 24 G Needle length: 9 cm Assessment Sensory level: T4 Events: CSF return

## 2021-09-25 NOTE — Op Note (Addendum)
PATIENT ID:      [name]  MRN:     [id number] DOB/AGE:    [birth year] / 71 y.o.  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 [employer]  (present throughout entire procedure and necessary for timely completion of the procedure)   ANESTHESIA: Spinal, Exparel 133mg  injection BLOOD LOSS: 450 cc FLUID REPLACEMENT: 1600 cc crystalloid TRANEXAMIC ACID: 1gm IV, 2gm Topical COMPLICATIONS: none    INDICATIONS FOR PROCEDURE: A 71 y.o. 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 Despite conservative measures with observation, anti-inflammatory medicine, narcotics, use of a cane, has severe unremitting pain and can ambulate only a few blocks before resting. Patient desires elective L total hip arthroplasty to decrease pain and increase function. The risks, benefits, and alternatives were discussed at length including but not limited to the risks of infection, bleeding, nerve injury, stiffness, blood clots, the need for revision surgery, cardiopulmonary complications, among others, and they were willing to proceed. Questions answered      PROCEDURE IN DETAIL: The patient was identified by armband,   received preoperative IV antibiotics in the holding area at [facility], taken to the operating room , appropriate anesthetic monitors   were attached and anesthesia was induced with the patient on the gurney. HANA boots were  applied to the feet, and the patient  was transferred to the HANA table with a peroneal post and support underneath the non-operative leg. Theoperative lower extremity was then prepped and draped in the usual sterile fashion from just above the iliac crest to the knee. And a timeout procedure was performed. Kerry Hough. Hardin Negus [facility] was present and scrubbed throughout the case, critical for assistance with, positioning, exposure, retraction, instrumentation, and closure.Skin along incision area was injected with 10 cc of Exparel solution. We then made a 13 cm incision along the interval at the leading edge of the tensor fascia lata of starting at 2 cm lateral to the ASIS. Small bleeders in the skin and subcutaneous tissue identified and cauterized we dissected down to the fascia and made an incision in the fascia allowing [place] to elevate the fascia of the tensor muscle and exploited the interval between the rectus and the tensor fascia lata. A Cobra retractor was then placed along the superior neck of the femur. A cerebellar retractor was used to expose the interval between the tensor fascia  lata and the rectus femoris.  We identified and cauterized the ascending branch of the anterior circumflex artery. A second Cobra retractor along the inferior neck of the femur. A small Hohmann retractor was placed underneath the origin of the rectus femoris, giving [place] good medial exposure. Using Ronguers fatty tissue was removed from in front of the anterior capsule. The capsule was then incised, starting out at the superior anterior rim of the acetabulum going laterally along the anterior neck. The capsule was then teed along the neck superiorly and inferiorly. Electrocautery was used to release capsule from the anterior and medial neck of the femur to allow external rotation. Cobra retractors were then placed along the inferior and superior neck allowing [place] to perform a standard neck cut and removed the femoral head with a power  corkscrew. We then placed a medium bent homan retractor in the cotyloid notch and posteriorly along the acetabular rim a narrow Cobra retractor. Exposed labral tissue and osteophytes were then removed. We then sequentially reamed up to a 49 mm basket reamer obtaining good coverage in all quadrants, verified by C-arm imaging. Under C-arm control we then hammered into place a 50 mm Gryption Sector cup in 45 of abduction and 15 of anteversion. The cup seated nicely and required no supplemental screws. We then placed a central hole Eliminator and a 0 polyethylene liner. The foot was then externally rotated to 130-140. The limb was extended and adducted to the floor, delivering the proximal femur up into the wound. A medium curved Hohmann retractor was placed over the greater trochanter and a long Homan retractor along the posterior femoral neck completing the exposure and lateralizing the femur. We then performed releases superiorly and and inferiorly of the capsule going back to the pirformis fossa superiorly and to the lesser trochanter inferiorly. We then entered the proximal femur with the box cutting offset chisel followed by, a canal sounder, the chili pepper and broaching up to a 6 broach. This seated nicely and we reamed the calcar. A trial reduction was performed with a 1 mm X 32 mm head.The limb lengths were excellent the hip was stable in 90 of external rotation. At this point the trial components removed and we hammered into place a # 6 std  Offset Actis stem with Gryption coating. A + 1 mm x 32 ceramic head was then hammered into place. The hip was reduced and final C-arm images obtained. The wound was thoroughly irrigated with normal saline solution. We repaired the ant capsule and the tensor fascia lot a with running 0 vicryl suture. the subcutaneous tissue was closed with 2-0 and 3-0 Vicryl suture followed by an Aquacil dressing. At this point the patient was awaken and transferred to hospital  gurney without difficulty.   Kerin Salen 09/25/2021, 7:19 AM

## 2021-09-25 NOTE — Discharge Instructions (Signed)

## 2021-09-25 NOTE — Transfer of Care (Signed)
Immediate Anesthesia Transfer of Care Note  Patient: [name]  Procedure(s) Performed: LEFT TOTAL HIP ARTHROPLASTY ANTERIOR APPROACH (Left: Hip)  Patient Location: PACU  Anesthesia Type:Spinal  Level of Consciousness: awake, alert , oriented and patient cooperative  Airway & Oxygen Therapy: Patient Spontanous Breathing and Patient connected to face mask oxygen  Post-op Assessment: Report given to RN and Post -op Vital signs reviewed and stable  Post vital signs: Reviewed and stable  Last Vitals:  Vitals Value Taken Time  BP 120/53 09/25/21 1317  Temp    Pulse 78 09/25/21 1320  Resp 10 09/25/21 1320  SpO2 100 % 09/25/21 1320  Vitals shown include unvalidated device data.  Last Pain:  Vitals:   09/25/21 0703  TempSrc: Oral         Complications: No notable events documented.

## 2021-09-25 NOTE — Interval H&P Note (Signed)
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  The various methods of treatment have been discussed with the patient and family. After consideration of risks, benefits and other options for treatment, the patient has consented to  Procedure(s): LEFT TOTAL HIP ARTHROPLASTY ANTERIOR APPROACH (Left) as a surgical intervention.  The patient's history has been reviewed, patient examined, no change in status, stable for surgery.  I have reviewed the patient's chart and labs.  Questions were answered to the patient's satisfaction.     Kerin Salen

## 2021-09-26 ENCOUNTER — Encounter (HOSPITAL_COMMUNITY): Payer: Self-pay | Admitting: Orthopedic Surgery

## 2021-09-26 LAB — GLUCOSE, CAPILLARY
Glucose-Capillary: 189 mg/dL — ABNORMAL HIGH (ref 70–99)
Glucose-Capillary: 269 mg/dL — ABNORMAL HIGH (ref 70–99)

## 2021-09-26 LAB — BASIC METABOLIC PANEL
Anion gap: 6 (ref 5–15)
BUN: 23 mg/dL (ref 8–23)
CO2: 23 mmol/L (ref 22–32)
Calcium: 8.7 mg/dL — ABNORMAL LOW (ref 8.9–10.3)
Chloride: 100 mmol/L (ref 98–111)
Creatinine, Ser: 0.99 mg/dL (ref 0.44–1.00)
GFR, Estimated: >60 mL/min (ref >60)
Glucose, Bld: 250 mg/dL — ABNORMAL HIGH (ref 70–99)
Potassium: 4.9 mmol/L (ref 3.5–5.1)
Sodium: 129 mmol/L — ABNORMAL LOW (ref 135–145)

## 2021-09-26 LAB — CBC
HCT: 33.2 % — ABNORMAL LOW (ref 36.0–46.0)
Hemoglobin: 11.1 g/dL — ABNORMAL LOW (ref 12.0–15.0)
MCH: 28.6 pg (ref 26.0–34.0)
MCHC: 33.4 g/dL (ref 30.0–36.0)
MCV: 85.6 fL (ref 80.0–100.0)
Platelets: 159 10*3/uL (ref 150–400)
RBC: 3.88 MIL/uL (ref 3.87–5.11)
RDW: 16.7 % — ABNORMAL HIGH (ref 11.5–15.5)
WBC: 14 10*3/uL — ABNORMAL HIGH (ref 4.0–10.5)
nRBC: 0 % (ref 0.0–0.2)

## 2021-09-26 NOTE — Progress Notes (Signed)
09/26/21 1300  PT Visit Information  Last PT Received On 09/26/21  Assistance Needed +1  Reviewed stairs this pm. PT  goals met. Pt is ready to d/c with family assist prn.   History of Present Illness 71 yo female s/p L DA THA. PMH: L humerus fx, pelvic fx, a fib, dCHF,  COPD, DM, HTN, spinal stenosis, pt reported remote L5 fx  Subjective Data  Patient Stated Goal home asap  Precautions  Precautions None  Restrictions  Weight Bearing Restrictions No  Other Position/Activity Restrictions WBAT  Pain Assessment  Pain Assessment Faces  Faces Pain Scale 2  Pain Location L hip  Pain Descriptors / Indicators Sore;Grimacing  Cognition  Arousal/Alertness Awake/alert  Behavior During Therapy WFL for tasks assessed/performed  Overall Cognitive Status Within Functional Limits for tasks assessed  Bed Mobility  General bed mobility comments OOB to chair with nursing  Transfers  Equipment used Rolling walker (2 wheels)  Transfers Sit to/from Stand  Sit to Stand Min guard  General transfer comment cues for hand placement from toilet and recliner  Ambulation/Gait  Ambulation/Gait assistance Min guard;Supervision  Gait Distance (Feet) 50 Feet  Assistive device Rolling walker (2 wheels)  Gait Pattern/deviations Step-to pattern;Decreased stance time - left  General Gait Details cues for sequence and RW position from self.  Stairs Yes  Stairs assistance Min guard  Stair Management Two rails;Step to pattern;Forwards  Number of Stairs 4  General stair comments cues for sequence and safe technique  Total Joint Exercises  Ankle Circles/Pumps AROM;Both;10 reps  Quad Sets AROM;Both;5 reps  PT - End of Session  Equipment Utilized During Treatment Gait belt  Activity Tolerance Patient tolerated treatment well  Patient left with call bell/phone within reach;in chair  Nurse Communication Mobility status   PT - Assessment/Plan  PT Plan Current plan remains appropriate  PT Visit Diagnosis Other  abnormalities of gait and mobility (R26.89);Difficulty in walking, not elsewhere classified (R26.2)  PT Frequency (ACUTE ONLY) 7X/week  Follow Up Recommendations Follow physician's recommendations for discharge plan and follow up therapies  Assistance recommended at discharge Intermittent Supervision/Assistance  Patient can return home with the following Help with stairs or ramp for entrance;Assist for transportation  PT equipment None recommended by PT  AM-PAC PT "6 Clicks" Mobility Outcome Measure (Version 2)  Help needed turning from your back to your side while in a flat bed without using bedrails? 3  Help needed moving from lying on your back to sitting on the side of a flat bed without using bedrails? 3  Help needed moving to and from a bed to a chair (including a wheelchair)? 3  Help needed standing up from a chair using your arms (e.g., wheelchair or bedside chair)? 3  Help needed to walk in hospital room? 3  Help needed climbing 3-5 steps with a railing?  3  6 Click Score 18  Consider Recommendation of Discharge To: Home with [facility]  Progressive Mobility  What is the highest level of mobility based on the progressive mobility assessment? Level 5 (Walks with assist in room/hall) - Balance while stepping forward/back and can walk in room with assist - Complete  Activity Ambulated with assistance in hallway  PT Goal Progression  Progress towards PT goals Progressing toward goals  Acute Rehab PT Goals  PT Goal Formulation With patient  Time For Goal Achievement 10/02/21  Potential to Achieve Goals Good  PT Time Calculation  PT Start Time (ACUTE ONLY) 1254  PT Stop Time (ACUTE ONLY)  1315  PT Time Calculation (min) (ACUTE ONLY) 21 min  PT General Charges  $$ ACUTE PT VISIT 1 Visit  PT Treatments  $Gait Training 8-22 mins

## 2021-09-26 NOTE — TOC Transition Note (Signed)
Transition of Care [facility]) - CM/SW Discharge Note   Patient Details  Name: [name] MRN: [id number] Date of Birth: [birth year]  Transition of Care [facility]) CM/SW Contact:  Lennart Pall, LCSW Phone Number: 09/26/2021, 9:52 AM   Clinical Narrative:    Met with pt and confirming she has all needed DME at home.  Aware that [place] prearranged with [facility] by MD office.  No further TOC needs.   Final next level of care: [place] Barriers to Discharge: No Barriers Identified   Patient Goals and CMS Choice Patient states their goals for this hospitalization and ongoing recovery are:: return home      Discharge Placement                       Discharge Plan and Services                DME Arranged: N/A DME Agency: NA       HH Arranged: PT HH Agency: [facility]        Social Determinants of Health (SDOH) Interventions     Readmission Risk Interventions Readmission Risk Prevention Plan 01/14/2019  Transportation Screening Complete  HRI or [place] Complete  Social Work Consult for [place] Planning/Counseling Complete  Palliative Care Screening Not Applicable  Medication Review [employer]) Complete  Some recent data might be hidden

## 2021-09-26 NOTE — Progress Notes (Signed)
PATIENT ID: [name]  MRN: [id number]  DOB/AGE:  73-May-1952 / 71 y.o.  1 Day Post-Op Procedure(s) (LRB): LEFT TOTAL HIP ARTHROPLASTY ANTERIOR APPROACH (Left)    PROGRESS NOTE Subjective: Patient is alert, oriented, no Nausea, no Vomiting, yes passing gas, . Taking PO well. Denies SOB, Chest or Calf Pain. Using Incentive Spirometer, PAS in place. Ambulate 59' Patient reports pain as  2/10  .    Objective: Vital signs in last 24 hours: Vitals:   09/25/21 1520 09/25/21 1748 09/25/21 2041 09/26/21 0051  BP: (!) 144/66 (!) 145/73 132/70 123/61  Pulse: 72 88 90 (!) 101  Resp: 18 16 18 18   Temp: 97.8 F (36.6 C)  97.7 F (36.5 C) 97.7 F (36.5 C)  TempSrc: Oral  Oral Oral  SpO2: 98% 92% 99% 99%  Weight: 74.4 kg     Height: 5\' 2"  (1.575 m)         Intake/Output from previous day: I/O last 3 completed shifts: In: 3390.5 [P.O.:1200; I.V.:2090.5; IV Piggyback:100] Out: 2540 [Urine:2090; Blood:450]   Intake/Output this shift: Total I/O In: 46.5 [I.V.:46.5] Out: -    LABORATORY DATA: Recent Labs    09/25/21 1641 09/25/21 2100 09/26/21 0328 09/26/21 0733  WBC  --   --  14.0*  --   HGB  --   --  11.1*  --   HCT  --   --  33.2*  --   PLT  --   --  159  --   NA  --   --  129*  --   K  --   --  4.9  --   CL  --   --  100  --   CO2  --   --  23  --   BUN  --   --  23  --   CREATININE  --   --  0.99  --   GLUCOSE  --   --  250*  --   GLUCAP 201* 380*  --  189*  CALCIUM  --   --  8.7*  --     Examination: Neurologically intact ABD soft Neurovascular intact Sensation intact distally Intact pulses distally Dorsiflexion/Plantar flexion intact Incision: dressing C/D/I No cellulitis present Compartment soft} XR AP&Lat of hip shows well placed\fixed THA  Assessment:   1 Day Post-Op Procedure(s) (LRB): LEFT TOTAL HIP ARTHROPLASTY ANTERIOR APPROACH (Left) ADDITIONAL DIAGNOSIS:  Expected Acute Blood Loss Anemia, Atrial fibrillation, COPD, diabetes, hypertension,  depression,Chronic anemia, CHF  Patient's anticipated LOS is less than 2 midnights, meeting these requirements: - Younger than 5 - Lives within 1 hour of care - Has a competent adult at home to recover with post-op recover - NO history of  - Chronic pain requiring opiods  - Diabetes  - Coronary Artery Disease  - Heart failure  - Heart attack  - Stroke  - DVT/VTE  - Cardiac arrhythmia  - Respiratory Failure/COPD  - Renal failure  - Anemia  - Advanced Liver disease     Plan: PT/OT WBAT, THA  DVT Prophylaxis: SCDx72 hrs, Xarelto 10mg  qd  DISCHARGE PLAN: Home, Today if she passes physical therapy.  DISCHARGE NEEDS: HHPT, Walker, and 3-in-1 comode seat Patient ID: [name], female   DOB: August 26, 1950, 71 y.o.   MRN: [id number]

## 2021-09-26 NOTE — Progress Notes (Signed)
Physical Therapy Treatment Patient Details Name: [name] MRN: [id number] DOB: [birth year] Today's Date: 09/26/2021   History of Present Illness 71 yo female s/p L DA THA. PMH: L humerus fx, pelvic fx, a fib, dCHF,  COPD, DM, HTN, spinal stenosis, pt reported remote L5 fx    PT Comments    Pt is progressing. Pt would like to practice stairs prior to d/c, will return for stair training (does not have stairs to enter her home)   Recommendations for follow up therapy are one component of a multi-disciplinary discharge planning process, led by the attending physician.  Recommendations may be updated based on patient status, additional functional criteria and insurance authorization.  Follow Up Recommendations  Follow physician's recommendations for discharge plan and follow up therapies     Assistance Recommended at Discharge Intermittent Supervision/Assistance  Patient can return home with the following Help with stairs or ramp for entrance;Assist for transportation   Equipment Recommendations  None recommended by PT    Recommendations for Other Services       Precautions / Restrictions Precautions Precautions: None Restrictions Weight Bearing Restrictions: No Other Position/Activity Restrictions: WBAT     Mobility  Bed Mobility               General bed mobility comments: OOB to chair with nursing    Transfers   Equipment used: Rolling walker (2 wheels) Transfers: Sit to/from Stand Sit to Stand: Min guard           General transfer comment: cues for hand placement from toilet and recliner    Ambulation/Gait Ambulation/Gait assistance: Min guard, Supervision Gait Distance (Feet): 120 Feet (+15 x 2) Assistive device: Rolling walker (2 wheels) Gait Pattern/deviations: Step-to pattern, Decreased stance time - left       General Gait Details: cues for sequence and RW position from self.   Stairs             Wheelchair Mobility    Modified  Rankin (Stroke Patients Only)       Balance                                            Cognition Arousal/Alertness: Awake/alert Behavior During Therapy: WFL for tasks assessed/performed Overall Cognitive Status: Within Functional Limits for tasks assessed                                          Exercises      General Comments        Pertinent Vitals/Pain Pain Assessment Pain Assessment: Faces Faces Pain Scale: Hurts a little bit Pain Location: L hip Pain Descriptors / Indicators: Sore, Grimacing Pain Intervention(s): Limited activity within patient's tolerance, Premedicated before session, Monitored during session, Ice applied    Home Living                          Prior Function            PT Goals (current goals can now be found in the care plan section) Acute Rehab PT Goals Patient Stated Goal: home asap PT Goal Formulation: With patient Time For Goal Achievement: 10/02/21 Potential to Achieve Goals: Good Progress towards PT goals: Progressing toward goals    Frequency  7X/week      PT Plan Current plan remains appropriate    Co-evaluation              AM-PAC PT "6 Clicks" Mobility   Outcome Measure  Help needed turning from your back to your side while in a flat bed without using bedrails?: A Little Help needed moving from lying on your back to sitting on the side of a flat bed without using bedrails?: A Little Help needed moving to and from a bed to a chair (including a wheelchair)?: A Little Help needed standing up from a chair using your arms (e.g., wheelchair or bedside chair)?: A Little Help needed to walk in hospital room?: A Little Help needed climbing 3-5 steps with a railing? : A Little 6 Click Score: 18    End of Session Equipment Utilized During Treatment: Gait belt Activity Tolerance: Patient tolerated treatment well Patient left: with call bell/phone within reach;in  chair Nurse Communication: Mobility status PT Visit Diagnosis: Other abnormalities of gait and mobility (R26.89);Difficulty in walking, not elsewhere classified (R26.2)     Time: 6770-3403 PT Time Calculation (min) (ACUTE ONLY): 27 min  Charges:  $Gait Training: 23-37 mins                     Baxter Flattery, PT  Acute Rehab Dept ([place]) [phone] Pager [phone]  09/26/2021    [facility] 09/26/2021, 10:49 AM

## 2021-09-26 NOTE — Discharge Summary (Signed)
Patient ID: [name] MRN: [id number] DOB/AGE: 1951/08/01 71 y.o.  Admit date: 09/25/2021 Discharge date: 09/26/2021  Admission Diagnoses:  Principal Problem:   Osteoarthritis of left hip Active Problems:   H/O total hip arthroplasty, left   Discharge Diagnoses:  Same  Past Medical History:  Diagnosis Date   Anemia, unspecified    Arthritis    Asthma    Atrial fibrillation with RVR ([place])    a. on Xarelto   Bell's palsy    Facial nerve decompression in 2001   CHF (congestive heart failure) (HCC)    Chronic low back pain    COPD with asthma ([place])    Coronary artery disease    Myoview 04/12/11 was entirely normal. ECHO 02/26/08 showed only minor abnormalities. Stenting 05/26/08 of her posterolateral branch to the left circumflex coronary artery. Used a 2.5x67mm Taxus Monorail stent.myoview 2014 was without ischemia   Diabetes mellitus    Type 2   Early cataracts, bilateral    Fatty liver    GERD (gastroesophageal reflux disease)    Glaucoma    Glaucoma    Goiter    Heart murmur    History of nuclear stress test 2012; 2014   lexiscan; normal pattern of perfusion; normal, low risk scan    Hyperlipidemia    Hypertension    Panic disorder    Pneumonia 2008   Polycystic ovary disease    Hysterectomy in 1982 for this   Shortness of breath dyspnea    ECHO 02/26/08 showed only minor abnormalities   Spinal stenosis     Surgeries: Procedure(s): LEFT TOTAL HIP ARTHROPLASTY ANTERIOR APPROACH on 09/25/2021   Consultants:   Discharged Condition: Improved  Hospital Course: JAYMIE MCKIDDY is an 71 y.o. female who was admitted 09/25/2021 for operative treatment ofOsteoarthritis of left hip. Patient has severe unremitting pain that affects sleep, daily activities, and work/hobbies. After pre-op clearance the patient was taken to the operating room on 09/25/2021 and underwent  Procedure(s): LEFT TOTAL HIP ARTHROPLASTY ANTERIOR APPROACH.    Patient was given perioperative  antibiotics:  Anti-infectives (From admission, onward)    Start     Dose/Rate Route Frequency Ordered Stop   09/25/21 0715  ceFAZolin (ANCEF) IVPB 2g/100 mL premix        2 g 200 mL/hr over 30 Minutes Intravenous On call to O.R. 09/25/21 0703 09/25/21 1133        Patient was given sequential compression devices, early ambulation, and chemoprophylaxis to prevent DVT.  Patient benefited maximally from hospital stay and there were no complications.    Recent vital signs: Patient Vitals for the past 24 hrs:  BP Temp Temp src Pulse Resp SpO2 Height Weight  09/26/21 0051 123/61 97.7 F (36.5 C) Oral (!) 101 18 99 % -- --  09/25/21 2041 132/70 97.7 F (36.5 C) Oral 90 18 99 % -- --  09/25/21 1748 (!) 145/73 -- -- 88 16 92 % -- --  09/25/21 1520 (!) 144/66 97.8 F (36.6 C) Oral 72 18 98 % 5\' 2"  (1.575 m) 74.4 kg  09/25/21 1445 (!) 124/55 -- -- 75 19 98 % -- --  09/25/21 1430 (!) 116/59 -- -- 71 (!) 21 97 % -- --  09/25/21 1415 (!) 119/47 -- -- (!) 52 18 96 % -- --  09/25/21 1400 108/68 -- -- 70 13 96 % -- --  09/25/21 1345 (!) 104/51 -- -- 75 14 94 % -- --  09/25/21 1330 (!) 116/55 -- -- 69  10 94 % -- --  09/25/21 1317 -- 97.8 F (36.6 C) -- 64 11 99 % -- --     Recent laboratory studies:  Recent Labs    09/26/21 0328  WBC 14.0*  HGB 11.1*  HCT 33.2*  PLT 159  NA 129*  K 4.9  CL 100  CO2 23  BUN 23  CREATININE 0.99  GLUCOSE 250*  CALCIUM 8.7*     Discharge Medications:   Allergies as of 09/26/2021       Reactions   Benadryl [diphenhydramine Hcl] Other (See Comments)   Restless leg   Clopidogrel Bisulfate    Other reaction(s): stomach upset   Ioversol Other (See Comments)   Unknown   Benadryl [diphenhydramine] Anxiety   Iodinated Contrast Media Palpitations   Rapid heart rate, hot Other reaction(s): rapid heartbeat        Medication List     STOP taking these medications    celecoxib 200 MG capsule Commonly known as: CELEBREX    HYDROcodone-acetaminophen 5-325 MG tablet Commonly known as: NORCO/VICODIN       TAKE these medications    ascorbic acid 1000 MG tablet Commonly known as: VITAMIN C Take 1,000 mg by mouth daily.   CALCIUM CITRATE PLUS/MAGNESIUM PO Take 400 mg by mouth daily.   colestipol 1 g tablet Commonly known as: COLESTID Take 0.5 g by mouth 2 (two) times daily.   diltiazem 180 MG 24 hr capsule Commonly known as: CARDIZEM CD TAKE 1 CAPSULE DAILY.   famotidine 20 MG tablet Commonly known as: PEPCID Take 20 mg by mouth 2 (two) times daily.   FREESTYLE LITE test strip Generic drug: glucose blood Use as instructed   gabapentin 100 MG capsule Commonly known as: NEURONTIN TAKE ONE CAPSULE BY MOUTH THREE TIMES DAILY   guaiFENesin 600 MG 12 hr tablet Commonly known as: MUCINEX Take 600 mg by mouth 2 (two) times daily.   Invokana 100 MG Tabs tablet Generic drug: canagliflozin Take 100 mg by mouth every morning.   Janumet 50-1000 MG tablet Generic drug: sitaGLIPtin-metformin Take 0.5-1 tablets by mouth See admin instructions. Take 0.5 tablet in the morning and 1 tablet at bedtime   latanoprost 0.005 % ophthalmic solution Commonly known as: XALATAN Place 1 drop into both eyes at bedtime.   LORazepam 0.5 MG tablet Commonly known as: ATIVAN TAKE 1 OR 2 TABLETS AT BEDTIME. What changed: See the new instructions.   metoprolol succinate 100 MG 24 hr tablet Commonly known as: TOPROL-XL Take 1 tablet (100 mg total) by mouth 2 (two) times daily.   metoprolol tartrate 25 MG tablet Commonly known as: LOPRESSOR Take 1 tablet (25 mg total) by mouth daily as needed (if HR increases).   nystatin powder Commonly known as: MYCOSTATIN/NYSTOP Apply 1 application topically once a week. What changed: Another medication with the same name was changed. Make sure you understand how and when to take each.   nystatin ointment Commonly known as: MYCOSTATIN Apply 1 application topically 2 (two)  times daily as needed (IRRITATION). What changed: when to take this   OneTouch Delica Lancets Fine Misc 1 Units by Does not apply route as directed.   oxyCODONE-acetaminophen 5-325 MG tablet Commonly known as: PERCOCET/ROXICET Take 1 tablet by mouth every 4 (four) hours as needed for severe pain.   PARoxetine 10 MG tablet Commonly known as: PAXIL Take 2.5 tablets (25 mg total) by mouth every morning.   potassium chloride 10 MEQ CR capsule Commonly known as: MICRO-K Take  1 capsule (10 mEq total) by mouth 2 (two) times daily. What changed: when to take this   quinapril 40 MG tablet Commonly known as: ACCUPRIL TAKE 1 TABLET ONCE DAILY. What changed:  how much to take when to take this   rivaroxaban 10 MG Tabs tablet Commonly known as: XARELTO Take 1 tablet (10 mg total) by mouth daily. What changed:  medication strength how much to take   rOPINIRole 1 MG tablet Commonly known as: Requip Prn up to 3 tab a day-   rosuvastatin 10 MG tablet Commonly known as: CRESTOR TAKE ONE TABLET AT BEDTIME.   senna 8.6 MG Tabs tablet Commonly known as: SENOKOT Take 1 tablet by mouth at bedtime as needed for mild constipation.   tiZANidine 2 MG tablet Commonly known as: ZANAFLEX Take 1 tablet (2 mg total) by mouth every 6 (six) hours as needed.   torsemide 20 MG tablet Commonly known as: DEMADEX Take 2 tablets (40 mg total) by mouth once for 1 dose.   traZODone 100 MG tablet Commonly known as: DESYREL Up to 200 mg at bedtime for sleep What changed:  how much to take how to take this when to take this additional instructions   umeclidinium-vilanterol 62.5-25 MCG/ACT Aepb Commonly known as: Anoro Ellipta Inhale 1 puff into the lungs daily.               Durable Medical Equipment  (From admission, onward)           Start     Ordered   09/25/21 1525  DME Walker rolling  Once       Question:  Patient needs a walker to treat with the following condition   Answer:  Status post total hip replacement, left   09/25/21 1525   09/25/21 1525  DME 3 n 1  Once        09/25/21 1525              Discharge Care Instructions  (From admission, onward)           Start     Ordered   09/26/21 0000  Change dressing       Comments: Change dressing Only if drainage exceeds 40% of window on dressing   09/26/21 0811   09/26/21 0000  Weight bearing as tolerated        09/26/21 1252            Diagnostic Studies: DG C-Arm 1-60 Min-No Report  Result Date: 09/25/2021 Fluoroscopy was utilized by the requesting physician.  No radiographic interpretation.   DG HIP OPERATIVE UNILAT WITH PELVIS LEFT  Result Date: 09/25/2021 CLINICAL DATA:  Left hip replacement EXAM: OPERATIVE LEFT HIP (WITH PELVIS IF PERFORMED) 1 VIEWS TECHNIQUE: Fluoroscopic spot image(s) were submitted for interpretation post-operatively. FLUOROSCOPY: Fluoroscopy time: 00:06 DAP: 0.3287 Gycm2 COMPARISON:  None. FINDINGS: Intraoperative fluoroscopic images of the left hip demonstrate total hip arthroplasty. No obvious perihardware fracture or component malpositioning. IMPRESSION: Intraoperative fluoroscopic images of the left hip demonstrate total hip arthroplasty. Electronically Signed   By: Delanna Ahmadi M.D.   On: 09/25/2021 12:59    Disposition: Discharge disposition: 01-Home or Self Care       Discharge Instructions     Call MD / Call 911   Complete by: As directed    If you experience chest pain or shortness of breath, CALL 911 and be transported to the hospital emergency room.  If you develope a fever above 101 F, pus (white  drainage) or increased drainage or redness at the wound, or calf pain, call your surgeon's office.   Call MD / Call 911   Complete by: As directed    If you experience chest pain or shortness of breath, CALL 911 and be transported to the hospital emergency room.  If you develope a fever above 101 F, pus (white drainage) or increased drainage or  redness at the wound, or calf pain, call your surgeon's office.   Change dressing   Complete by: As directed    Change dressing Only if drainage exceeds 40% of window on dressing   Constipation Prevention   Complete by: As directed    Drink plenty of fluids.  Prune juice may be helpful.  You may use a stool softener, such as Colace (over the counter) 100 mg twice a day.  Use MiraLax (over the counter) for constipation as needed.   Constipation Prevention   Complete by: As directed    Drink plenty of fluids.  Prune juice may be helpful.  You may use a stool softener, such as Colace (over the counter) 100 mg twice a day.  Use MiraLax (over the counter) for constipation as needed.   Diet - low sodium heart healthy   Complete by: As directed    Driving restrictions   Complete by: As directed    No driving for 2 weeks   Increase activity slowly as tolerated   Complete by: As directed    Increase activity slowly as tolerated   Complete by: As directed    Patient may shower   Complete by: As directed    You may shower without a dressing once there is no drainage.  Do not wash over the wound.  If drainage remains, cover wound with plastic wrap and then shower.   Post-operative opioid taper instructions:   Complete by: As directed    POST-OPERATIVE OPIOID TAPER INSTRUCTIONS: It is important to wean off of your opioid medication as soon as possible. If you do not need pain medication after your surgery it is ok to stop day one. Opioids include: Codeine, Hydrocodone(Norco, Vicodin), Oxycodone(Percocet, oxycontin) and hydromorphone amongst others.  Long term and even short term use of opiods can cause: Increased pain response Dependence Constipation Depression Respiratory depression And more.  Withdrawal symptoms can include Flu like symptoms Nausea, vomiting And more Techniques to manage these symptoms Hydrate well Eat regular healthy meals Stay active Use relaxation techniques(deep  breathing, meditating, yoga) Do Not substitute Alcohol to help with tapering If you have been on opioids for less than two weeks and do not have pain than it is ok to stop all together.  Plan to wean off of opioids This plan should start within one week post op of your joint replacement. Maintain the same interval or time between taking each dose and first decrease the dose.  Cut the total daily intake of opioids by one tablet each day Next start to increase the time between doses. The last dose that should be eliminated is the evening dose.      Post-operative opioid taper instructions:   Complete by: As directed    POST-OPERATIVE OPIOID TAPER INSTRUCTIONS: It is important to wean off of your opioid medication as soon as possible. If you do not need pain medication after your surgery it is ok to stop day one. Opioids include: Codeine, Hydrocodone(Norco, Vicodin), Oxycodone(Percocet, oxycontin) and hydromorphone amongst others.  Long term and even short term use of opiods can cause:  Increased pain response Dependence Constipation Depression Respiratory depression And more.  Withdrawal symptoms can include Flu like symptoms Nausea, vomiting And more Techniques to manage these symptoms Hydrate well Eat regular healthy meals Stay active Use relaxation techniques(deep breathing, meditating, yoga) Do Not substitute Alcohol to help with tapering If you have been on opioids for less than two weeks and do not have pain than it is ok to stop all together.  Plan to wean off of opioids This plan should start within one week post op of your joint replacement. Maintain the same interval or time between taking each dose and first decrease the dose.  Cut the total daily intake of opioids by one tablet each day Next start to increase the time between doses. The last dose that should be eliminated is the evening dose.      Weight bearing as tolerated   Complete by: As directed          Follow-up Information     Frederik Pear, MD. Go on 10/05/2021.   Specialty: Orthopedic Surgery Why: Your appointment is scheduled for 9:45. Contact information: [place] [address] [phone]         [facility] Follow up.   Specialty: Home Health Services Why: You will receive 6 HHPT sessions prior to starting your outpatient physical therapy . Contact information: [address] [place] [address] [phone]         [facility], [place]. Go on 10/05/2021.   Why: You will start Outpatient physical therapy at 10:40. Please go across the lobby after seeing MD to complete your paperwork Contact information: [facility] [place] [address] [phone]                  Signed: Joanell Rising 09/26/2021, 12:52 PM

## 2021-09-27 DIAGNOSIS — F32A Depression, unspecified: Secondary | ICD-10-CM | POA: Diagnosis not present

## 2021-09-27 DIAGNOSIS — E1136 Type 2 diabetes mellitus with diabetic cataract: Secondary | ICD-10-CM | POA: Diagnosis not present

## 2021-09-27 DIAGNOSIS — E785 Hyperlipidemia, unspecified: Secondary | ICD-10-CM | POA: Diagnosis not present

## 2021-09-27 DIAGNOSIS — E1142 Type 2 diabetes mellitus with diabetic polyneuropathy: Secondary | ICD-10-CM | POA: Diagnosis not present

## 2021-09-27 DIAGNOSIS — G2581 Restless legs syndrome: Secondary | ICD-10-CM | POA: Diagnosis not present

## 2021-09-27 DIAGNOSIS — M1990 Unspecified osteoarthritis, unspecified site: Secondary | ICD-10-CM | POA: Diagnosis not present

## 2021-09-27 DIAGNOSIS — H409 Unspecified glaucoma: Secondary | ICD-10-CM | POA: Diagnosis not present

## 2021-09-27 DIAGNOSIS — I5032 Chronic diastolic (congestive) heart failure: Secondary | ICD-10-CM | POA: Diagnosis not present

## 2021-09-27 DIAGNOSIS — D649 Anemia, unspecified: Secondary | ICD-10-CM | POA: Diagnosis not present

## 2021-09-27 DIAGNOSIS — E1169 Type 2 diabetes mellitus with other specified complication: Secondary | ICD-10-CM | POA: Diagnosis not present

## 2021-09-27 DIAGNOSIS — Z471 Aftercare following joint replacement surgery: Secondary | ICD-10-CM | POA: Diagnosis not present

## 2021-09-27 DIAGNOSIS — G8929 Other chronic pain: Secondary | ICD-10-CM | POA: Diagnosis not present

## 2021-09-27 DIAGNOSIS — I110 Hypertensive heart disease with heart failure: Secondary | ICD-10-CM | POA: Diagnosis not present

## 2021-09-27 DIAGNOSIS — K219 Gastro-esophageal reflux disease without esophagitis: Secondary | ICD-10-CM | POA: Diagnosis not present

## 2021-09-27 DIAGNOSIS — I4811 Longstanding persistent atrial fibrillation: Secondary | ICD-10-CM | POA: Diagnosis not present

## 2021-09-27 DIAGNOSIS — I2510 Atherosclerotic heart disease of native coronary artery without angina pectoris: Secondary | ICD-10-CM | POA: Diagnosis not present

## 2021-09-27 DIAGNOSIS — E662 Morbid (severe) obesity with alveolar hypoventilation: Secondary | ICD-10-CM | POA: Diagnosis not present

## 2021-09-27 DIAGNOSIS — K760 Fatty (change of) liver, not elsewhere classified: Secondary | ICD-10-CM | POA: Diagnosis not present

## 2021-09-27 DIAGNOSIS — M48061 Spinal stenosis, lumbar region without neurogenic claudication: Secondary | ICD-10-CM | POA: Diagnosis not present

## 2021-09-27 DIAGNOSIS — M810 Age-related osteoporosis without current pathological fracture: Secondary | ICD-10-CM | POA: Diagnosis not present

## 2021-09-27 DIAGNOSIS — Z86718 Personal history of other venous thrombosis and embolism: Secondary | ICD-10-CM | POA: Diagnosis not present

## 2021-09-27 DIAGNOSIS — J449 Chronic obstructive pulmonary disease, unspecified: Secondary | ICD-10-CM | POA: Diagnosis not present

## 2021-09-27 DIAGNOSIS — F410 Panic disorder [episodic paroxysmal anxiety] without agoraphobia: Secondary | ICD-10-CM | POA: Diagnosis not present

## 2021-09-27 DIAGNOSIS — R32 Unspecified urinary incontinence: Secondary | ICD-10-CM | POA: Diagnosis not present

## 2021-09-27 MED ORDER — ACCU-CHEK SOFTCLIX LANCETS MISC: 12 refills | Status: DC

## 2021-09-27 MED ORDER — FREESTYLE LITE TEST VI STRP: ORAL_STRIP | 12 refills | Status: DC

## 2021-09-29 DIAGNOSIS — G2581 Restless legs syndrome: Secondary | ICD-10-CM | POA: Diagnosis not present

## 2021-09-29 DIAGNOSIS — J449 Chronic obstructive pulmonary disease, unspecified: Secondary | ICD-10-CM | POA: Diagnosis not present

## 2021-09-29 DIAGNOSIS — I5032 Chronic diastolic (congestive) heart failure: Secondary | ICD-10-CM | POA: Diagnosis not present

## 2021-09-29 DIAGNOSIS — D649 Anemia, unspecified: Secondary | ICD-10-CM | POA: Diagnosis not present

## 2021-09-29 DIAGNOSIS — E1142 Type 2 diabetes mellitus with diabetic polyneuropathy: Secondary | ICD-10-CM | POA: Diagnosis not present

## 2021-09-29 DIAGNOSIS — E662 Morbid (severe) obesity with alveolar hypoventilation: Secondary | ICD-10-CM | POA: Diagnosis not present

## 2021-09-29 DIAGNOSIS — Z86718 Personal history of other venous thrombosis and embolism: Secondary | ICD-10-CM | POA: Diagnosis not present

## 2021-09-29 DIAGNOSIS — K219 Gastro-esophageal reflux disease without esophagitis: Secondary | ICD-10-CM | POA: Diagnosis not present

## 2021-09-29 DIAGNOSIS — F32A Depression, unspecified: Secondary | ICD-10-CM | POA: Diagnosis not present

## 2021-09-29 DIAGNOSIS — I4811 Longstanding persistent atrial fibrillation: Secondary | ICD-10-CM | POA: Diagnosis not present

## 2021-09-29 DIAGNOSIS — M810 Age-related osteoporosis without current pathological fracture: Secondary | ICD-10-CM | POA: Diagnosis not present

## 2021-09-29 DIAGNOSIS — I110 Hypertensive heart disease with heart failure: Secondary | ICD-10-CM | POA: Diagnosis not present

## 2021-09-29 DIAGNOSIS — H409 Unspecified glaucoma: Secondary | ICD-10-CM | POA: Diagnosis not present

## 2021-09-29 DIAGNOSIS — K760 Fatty (change of) liver, not elsewhere classified: Secondary | ICD-10-CM | POA: Diagnosis not present

## 2021-09-29 DIAGNOSIS — R32 Unspecified urinary incontinence: Secondary | ICD-10-CM | POA: Diagnosis not present

## 2021-09-29 DIAGNOSIS — M48061 Spinal stenosis, lumbar region without neurogenic claudication: Secondary | ICD-10-CM | POA: Diagnosis not present

## 2021-09-29 DIAGNOSIS — E785 Hyperlipidemia, unspecified: Secondary | ICD-10-CM | POA: Diagnosis not present

## 2021-09-29 DIAGNOSIS — E1169 Type 2 diabetes mellitus with other specified complication: Secondary | ICD-10-CM | POA: Diagnosis not present

## 2021-09-29 DIAGNOSIS — Z471 Aftercare following joint replacement surgery: Secondary | ICD-10-CM | POA: Diagnosis not present

## 2021-09-29 DIAGNOSIS — E1136 Type 2 diabetes mellitus with diabetic cataract: Secondary | ICD-10-CM | POA: Diagnosis not present

## 2021-09-29 DIAGNOSIS — M1990 Unspecified osteoarthritis, unspecified site: Secondary | ICD-10-CM | POA: Diagnosis not present

## 2021-09-29 DIAGNOSIS — F410 Panic disorder [episodic paroxysmal anxiety] without agoraphobia: Secondary | ICD-10-CM | POA: Diagnosis not present

## 2021-09-29 DIAGNOSIS — I2510 Atherosclerotic heart disease of native coronary artery without angina pectoris: Secondary | ICD-10-CM | POA: Diagnosis not present

## 2021-09-29 DIAGNOSIS — G8929 Other chronic pain: Secondary | ICD-10-CM | POA: Diagnosis not present

## 2021-10-03 ENCOUNTER — Other Ambulatory Visit: Payer: Self-pay | Admitting: Cardiovascular Disease

## 2021-10-04 ENCOUNTER — Telehealth: Payer: Self-pay | Admitting: *Deleted

## 2021-10-04 ENCOUNTER — Telehealth: Payer: Self-pay | Admitting: Internal Medicine

## 2021-10-04 NOTE — Telephone Encounter (Signed)
Call placed to the patient. Left a message for the patient to call back. Request from her pharmacy for a replacement for the Quinapril due to unavailability and to also discuss her current dose of the Quinapril and discrepancy of 20 mg verses 40 mg .

## 2021-10-04 NOTE — Telephone Encounter (Signed)
[name] w/ [facility] states pt has gained 5 lbs in 1 week, caller inquiring if pt should resume taking torsemide (DEMADEX) 20 MG tablet (Expired)  Caller states patient has a bottle of the medication and it is not expired

## 2021-10-05 DIAGNOSIS — M25552 Pain in left hip: Secondary | ICD-10-CM | POA: Diagnosis not present

## 2021-10-05 DIAGNOSIS — Z9889 Other specified postprocedural states: Secondary | ICD-10-CM | POA: Diagnosis not present

## 2021-10-05 DIAGNOSIS — M1612 Unilateral primary osteoarthritis, left hip: Secondary | ICD-10-CM | POA: Diagnosis not present

## 2021-10-05 DIAGNOSIS — M25652 Stiffness of left hip, not elsewhere classified: Secondary | ICD-10-CM | POA: Diagnosis not present

## 2021-10-05 DIAGNOSIS — M6281 Muscle weakness (generalized): Secondary | ICD-10-CM | POA: Diagnosis not present

## 2021-10-05 NOTE — Telephone Encounter (Signed)
Okay to take torsemide 20 mg daily for 3 days then stop.

## 2021-10-05 NOTE — Telephone Encounter (Signed)
Spoke with Sharyn Lull w/ [facility] and was able to inform her of Dr. Nathanial Millman instructions for the pt.

## 2021-10-08 DIAGNOSIS — D692 Other nonthrombocytopenic purpura: Secondary | ICD-10-CM | POA: Diagnosis not present

## 2021-10-08 DIAGNOSIS — R600 Localized edema: Secondary | ICD-10-CM | POA: Diagnosis not present

## 2021-10-08 DIAGNOSIS — L89151 Pressure ulcer of sacral region, stage 1: Secondary | ICD-10-CM | POA: Diagnosis not present

## 2021-10-09 ENCOUNTER — Other Ambulatory Visit (HOSPITAL_COMMUNITY): Payer: Self-pay | Admitting: Orthopedic Surgery

## 2021-10-09 DIAGNOSIS — M79605 Pain in left leg: Secondary | ICD-10-CM

## 2021-10-09 DIAGNOSIS — M7989 Other specified soft tissue disorders: Secondary | ICD-10-CM

## 2021-10-09 NOTE — Telephone Encounter (Signed)
Left a message for the patient to call back.  Need to clarify how much of the Quinapril the patient is taking.

## 2021-10-10 ENCOUNTER — Ambulatory Visit (HOSPITAL_COMMUNITY)
Admission: RE | Admit: 2021-10-10 | Discharge: 2021-10-10 | Disposition: A | Discharge status: Home / Self Care | Payer: HMO | Source: Ambulatory Visit | Attending: Orthopedic Surgery | Admitting: Orthopedic Surgery

## 2021-10-10 ENCOUNTER — Other Ambulatory Visit: Payer: Self-pay

## 2021-10-10 DIAGNOSIS — M79605 Pain in left leg: Secondary | ICD-10-CM | POA: Diagnosis not present

## 2021-10-10 DIAGNOSIS — M7989 Other specified soft tissue disorders: Secondary | ICD-10-CM | POA: Insufficient documentation

## 2021-10-10 NOTE — Progress Notes (Signed)
Lower extremity venous has been completed.   Preliminary results in CV Proc.   [name] [name] 10/10/2021 9:58 AM

## 2021-10-12 ENCOUNTER — Telehealth: Payer: Self-pay | Admitting: Cardiovascular Disease

## 2021-10-12 DIAGNOSIS — M25652 Stiffness of left hip, not elsewhere classified: Secondary | ICD-10-CM | POA: Diagnosis not present

## 2021-10-12 DIAGNOSIS — M6281 Muscle weakness (generalized): Secondary | ICD-10-CM | POA: Diagnosis not present

## 2021-10-12 DIAGNOSIS — M25552 Pain in left hip: Secondary | ICD-10-CM | POA: Diagnosis not present

## 2021-10-12 NOTE — Telephone Encounter (Signed)
-  Received call after 5 pm ?-pt state she is currently experiencing left leg swelling up to her thigh. She state its red, painful, and warm. Pt recently had ultrasound on 2/28 that ruled out DVT. ?-Pt also report some abdominal swelling and SOB with exertion. ?-Pt was instructed to take torsemide 20 mg x 3 days on 2/23 by Dr. Eda Paschal but reports no change.   ?-Pt's weight is now up to 7 lbs in 2 weeks. ? ?Office is currently closed and DOD physician has left for the day.Nurse instructed pt to either report to ER/Urgent Care for further evaluations or call our after hour physician.  ? ?Pt verbalized understanding.  ?

## 2021-10-12 NOTE — Telephone Encounter (Signed)
Pt c/o swelling: STAT is pt has developed SOB within 24 hours ? ?If swelling, where is the swelling located? Left leg swelling ? ?How much weight have you gained and in what time span? 10lbs 2 1/2 weeks ? ?Have you gained 3 pounds in a day or 5 pounds in a week? 5lbs in a week ? ?Do you have a log of your daily weights (if so, list)? 166 when she went into surgery, today she weight 173 ? ?Are you currently taking a fluid pill? yes ? ?Are you currently SOB? Yes, more so than usual ? ?Have you traveled recently? no  ?The swelling didn't start until the second week she was home from the surgery.  She would like a chest x-ray ordered.   ?

## 2021-10-13 NOTE — Telephone Encounter (Signed)
Left message to call back  

## 2021-10-13 NOTE — Telephone Encounter (Signed)
Very hard to say without examination, but she may have cellulitis if the area is red and warm and the swelling is asymmetrical. Has she had fever or chills? Has she contacted the orthopedic surgeon about it (had hip surgery on that side about 2 weeks ago). ?

## 2021-10-16 ENCOUNTER — Ambulatory Visit: Payer: HMO | Admitting: Podiatry

## 2021-10-16 DIAGNOSIS — M25652 Stiffness of left hip, not elsewhere classified: Secondary | ICD-10-CM | POA: Diagnosis not present

## 2021-10-16 DIAGNOSIS — M6281 Muscle weakness (generalized): Secondary | ICD-10-CM | POA: Diagnosis not present

## 2021-10-16 DIAGNOSIS — M25552 Pain in left hip: Secondary | ICD-10-CM | POA: Diagnosis not present

## 2021-10-16 NOTE — Telephone Encounter (Signed)
Left message to call back  

## 2021-10-16 NOTE — Telephone Encounter (Signed)
Left a message for the patient to call back.  

## 2021-10-16 NOTE — Telephone Encounter (Signed)
Left a message for the patient to call back. We have also been trying to get in touch with her about her Quinapril. This is no longer available per her pharmacy. Per lab note from 06/05/21, this was changed to 20 mg daily. We need to know if she has been on the 20 mg or 40 mg daily.  ?

## 2021-10-16 NOTE — Telephone Encounter (Signed)
See other note

## 2021-10-17 ENCOUNTER — Encounter: Payer: Self-pay | Admitting: Internal Medicine

## 2021-10-17 ENCOUNTER — Other Ambulatory Visit: Payer: Self-pay | Admitting: Internal Medicine

## 2021-10-17 NOTE — Progress Notes (Signed)
Subjective:    Patient ID: [name], female    DOB: October 04, 1950, 71 y.o.   MRN: [id number]  This visit occurred during the SARS-CoV-2 public health emergency.  Safety protocols were in place, including screening questions prior to the visit, additional usage of staff PPE, and extensive cleaning of exam room while observing appropriate contact time as indicated for disinfecting solutions.    HPI [name] is here for  Chief Complaint  Patient presents with   Leg Swelling    She has a history of CAD, diastolic heart failure, atrial fibrillation, pericardial effusion  She had to have her left hip replaced 2/13 and is still recovering from that.  She states the swelling in her left leg started 1 week after surgery.  She did have some right leg swelling, but was much more in the left leg.  She had something similar in the past and it was related to fluid overload, which was seen on a chest x-ray.  She ended up in the hospital and was very sick.  She is very concerned about that occurring again.  Since the hospitalization for her hip replacement she has had leg swelling and weight gain.  She thought she was probably fluid overloaded.  Her PCP advised going back on the torsemide for 3 days-she took torsemide 20 mg daily for 3 days and that helped.  When she stopped the swelling increased again and she has been taking it on a daily basis since then.  She feels it is helping.  An ultrasound of her left lower leg was done 2/28 that ruled out a DVT.  She is wearing compression socks more consistently, which is helping.  She is having difficulty getting around.  Over the past several months she has not been able to move much because of left leg pain and hip pain.  She has very low stamina.  Had a recent hip replacement and she is unable to walk long distances.  She is restarting to do physical therapy.  She needs to have a walker with wheels and a seat that she can start walking further distances but  be able to sit down if needed.  Rollator is medically necessary to help with her recovery.    Medications and allergies reviewed with patient and updated if appropriate.  Current Outpatient Medications on File Prior to Visit  Medication Sig Dispense Refill   Accu-Chek Softclix Lancets lancets Use as instructed 100 each 12   ascorbic acid (VITAMIN C) 1000 MG tablet Take 1,000 mg by mouth daily.     colestipol (COLESTID) 1 g tablet Take 0.5 g by mouth 2 (two) times daily.     diltiazem (CARDIZEM CD) 180 MG 24 hr capsule TAKE 1 CAPSULE DAILY. 90 capsule 3   famotidine (PEPCID) 20 MG tablet Take 20 mg by mouth 2 (two) times daily.     gabapentin (NEURONTIN) 100 MG capsule TAKE ONE CAPSULE BY MOUTH THREE TIMES DAILY 180 capsule 0   glucose blood (FREESTYLE LITE) test strip Use as instructed 100 each 12   guaiFENesin (MUCINEX) 600 MG 12 hr tablet Take 600 mg by mouth 2 (two) times daily.     INVOKANA 100 MG TABS tablet Take 100 mg by mouth every morning.     JANUMET 50-1000 MG tablet Take 0.5-1 tablets by mouth See admin instructions. Take 0.5 tablet in the morning and 1 tablet at bedtime     latanoprost (XALATAN) 0.005 % ophthalmic solution Place 1 drop  into both eyes at bedtime.     LORazepam (ATIVAN) 0.5 MG tablet TAKE 1 OR 2 TABLETS AT BEDTIME. (Patient taking differently: Take 1 mg by mouth at bedtime.) 60 tablet 5   metoprolol succinate (TOPROL-XL) 100 MG 24 hr tablet Take 1 tablet (100 mg total) by mouth 2 (two) times daily. 180 tablet 3   metoprolol tartrate (LOPRESSOR) 25 MG tablet Take 1 tablet (25 mg total) by mouth daily as needed (if HR increases).     Multiple Minerals-Vitamins (CALCIUM CITRATE PLUS/MAGNESIUM PO) Take 400 mg by mouth daily.     nystatin (MYCOSTATIN/NYSTOP) powder Apply 1 application topically once a week. 60 g 6   nystatin ointment (MYCOSTATIN) Apply 1 application topically 2 (two) times daily as needed (IRRITATION). (Patient taking differently: Apply 1 application.  topically daily as needed (IRRITATION).) 100 g 6   oxyCODONE-acetaminophen (PERCOCET/ROXICET) 5-325 MG tablet Take 1 tablet by mouth every 4 (four) hours as needed for severe pain. 30 tablet 0   PARoxetine (PAXIL) 10 MG tablet TAKE 2 AND 1/2 TABLETS BY MOUTH ONCE DAILY 225 tablet 0   potassium chloride (MICRO-K) 10 MEQ CR capsule Take 1 capsule (10 mEq total) by mouth 2 (two) times daily. (Patient taking differently: Take 10 mEq by mouth at bedtime.) 180 capsule 2   quinapril (ACCUPRIL) 40 MG tablet TAKE 1 TABLET ONCE DAILY. 90 tablet 1   rivaroxaban (XARELTO) 10 MG TABS tablet Take 1 tablet (10 mg total) by mouth daily. 30 tablet 0   rOPINIRole (REQUIP) 1 MG tablet Prn up to 3 tab a day- 90 tablet 5   rosuvastatin (CRESTOR) 10 MG tablet TAKE ONE TABLET AT BEDTIME. 90 tablet 3   senna (SENOKOT) 8.6 MG TABS Take 1 tablet by mouth at bedtime as needed for mild constipation.     tiZANidine (ZANAFLEX) 2 MG tablet Take 1 tablet (2 mg total) by mouth every 6 (six) hours as needed. 60 tablet 0   traZODone (DESYREL) 100 MG tablet Up to 200 mg at bedtime for sleep (Patient taking differently: Take 150 mg by mouth at bedtime.) 180 tablet 3   umeclidinium-vilanterol (ANORO ELLIPTA) 62.5-25 MCG/ACT AEPB Inhale 1 puff into the lungs daily. 60 each 12   torsemide (DEMADEX) 20 MG tablet Take 2 tablets (40 mg total) by mouth once for 1 dose. (Patient not taking: Reported on 09/15/2021) 360 tablet 0   No current facility-administered medications on file prior to visit.    Review of Systems  Constitutional:  Negative for chills and fever.  Respiratory:  Positive for cough and shortness of breath (a little).   Cardiovascular:  Positive for leg swelling (L> R -- improved - fairly b/l more recently). Negative for chest pain.      Objective:   Vitals:   10/18/21 1342  BP: 116/68  Pulse: 70  Temp: 98.6 F (37 C)  SpO2: 96%   BP Readings from Last 3 Encounters:  10/18/21 116/68  09/26/21 123/61  09/19/21  140/65   Wt Readings from Last 3 Encounters:  10/18/21 168 lb 3.2 oz (76.3 kg)  09/25/21 164 lb 0.4 oz (74.4 kg)  09/19/21 164 lb (74.4 kg)   Body mass index is 30.76 kg/m.    Physical Exam Constitutional:      General: She is not in acute distress.    Appearance: Normal appearance. She is not ill-appearing.  HENT:     Head: Normocephalic.  Eyes:     Conjunctiva/sclera: Conjunctivae normal.  Cardiovascular:  Rate and Rhythm: Normal rate and regular rhythm.  Pulmonary:     Effort: Pulmonary effort is normal. No respiratory distress.     Breath sounds: Normal breath sounds. No wheezing or rales.  Musculoskeletal:     Cervical back: Neck supple.     Right lower leg: Edema (Mild) present.     Left lower leg: Edema (1+ pitting) present.  Lymphadenopathy:     Cervical: No cervical adenopathy.  Skin:    General: Skin is warm and dry.     Findings: Erythema (Mild erythema anterior left lower leg-has improved-was worse when the swelling was worse) present.  Neurological:     Mental Status: She is alert.           Assessment & Plan:    No stamina, osteoarthritis, with recent hip replacement, poor balance: Unable to walk long distances and needs to build up her stamina and start walking more Requires a rollator to help her do this Will order one  Bilateral leg swelling-left leg more than right: Associated with some weight gain, shortness of breath Symptoms improved with torsemide 20 mg daily, but worsened when she stopped this-at this point she will continue the torsemide 20 mg daily BNP, BMP Chest x-ray We will follow-up with cardiology as scheduled-depending on fluid balance or concerns may need to see PCP or cardiology sooner Continue compression socks, elevating legs  Iron deficiency anemia: Chronic Doing pea protein and Fluordix, which is helping Has had iron infusions Had recent surgery and blood counts dropped minimally CBC, iron panel today, especially  given shortness of breath and fluid overload  Hypertension: Chronic Well-controlled Quinapril no longer available-we will change to lisinopril 20 mg (was taking 20 mg of the quinapril Continue diltiazem 180 mg daily, metoprolol XL 100 mg twice daily

## 2021-10-18 ENCOUNTER — Other Ambulatory Visit: Payer: Self-pay

## 2021-10-18 ENCOUNTER — Ambulatory Visit (INDEPENDENT_AMBULATORY_CARE_PROVIDER_SITE_OTHER): Payer: HMO

## 2021-10-18 ENCOUNTER — Ambulatory Visit (INDEPENDENT_AMBULATORY_CARE_PROVIDER_SITE_OTHER): Payer: HMO | Admitting: Internal Medicine

## 2021-10-18 VITALS — BP 116/68 | HR 70 | Temp 98.6°F | Ht 62.0 in | Wt 168.2 lb

## 2021-10-18 DIAGNOSIS — R5383 Other fatigue: Secondary | ICD-10-CM | POA: Diagnosis not present

## 2021-10-18 DIAGNOSIS — M159 Polyosteoarthritis, unspecified: Secondary | ICD-10-CM

## 2021-10-18 DIAGNOSIS — R2689 Other abnormalities of gait and mobility: Secondary | ICD-10-CM | POA: Diagnosis not present

## 2021-10-18 DIAGNOSIS — R0602 Shortness of breath: Secondary | ICD-10-CM

## 2021-10-18 DIAGNOSIS — D509 Iron deficiency anemia, unspecified: Secondary | ICD-10-CM | POA: Diagnosis not present

## 2021-10-18 LAB — BASIC METABOLIC PANEL
BUN: 21 mg/dL (ref 6–23)
CO2: 29 mEq/L (ref 19–32)
Calcium: 9.3 mg/dL (ref 8.4–10.5)
Chloride: 96 mEq/L (ref 96–112)
Creatinine, Ser: 1.16 mg/dL (ref 0.40–1.20)
GFR: 46.94 mL/min — ABNORMAL LOW (ref >60.00)
Glucose, Bld: 139 mg/dL — ABNORMAL HIGH (ref 70–99)
Potassium: 4.4 mEq/L (ref 3.5–5.1)
Sodium: 132 mEq/L — ABNORMAL LOW (ref 135–145)

## 2021-10-18 LAB — FERRITIN: Ferritin: 53.5 ng/mL (ref 10.0–291.0)

## 2021-10-18 LAB — CBC WITH DIFFERENTIAL/PLATELET
Basophils Absolute: 0.1 10*3/uL (ref 0.0–0.1)
Basophils Relative: 0.9 % (ref 0.0–3.0)
Eosinophils Absolute: 0.3 10*3/uL (ref 0.0–0.7)
Eosinophils Relative: 3.5 % (ref 0.0–5.0)
HCT: 31.7 % — ABNORMAL LOW (ref 36.0–46.0)
Hemoglobin: 10.6 g/dL — ABNORMAL LOW (ref 12.0–15.0)
Lymphocytes Relative: 10.7 % — ABNORMAL LOW (ref 12.0–46.0)
Lymphs Abs: 0.8 10*3/uL (ref 0.7–4.0)
MCHC: 33.4 g/dL (ref 30.0–36.0)
MCV: 86.8 fl (ref 78.0–100.0)
Monocytes Absolute: 0.5 10*3/uL (ref 0.1–1.0)
Monocytes Relative: 7 % (ref 3.0–12.0)
Neutro Abs: 6 10*3/uL (ref 1.4–7.7)
Neutrophils Relative %: 77.9 % — ABNORMAL HIGH (ref 43.0–77.0)
Platelets: 238 10*3/uL (ref 150.0–400.0)
RBC: 3.66 Mil/uL — ABNORMAL LOW (ref 3.87–5.11)
RDW: 14.6 % (ref 11.5–15.5)
WBC: 7.7 10*3/uL (ref 4.0–10.5)

## 2021-10-18 LAB — IBC PANEL
Iron: 50 ug/dL (ref 42–145)
Saturation Ratios: 11.5 % — ABNORMAL LOW (ref 20.0–50.0)
TIBC: 435.4 ug/dL (ref 250.0–450.0)
Transferrin: 311 mg/dL (ref 212.0–360.0)

## 2021-10-18 LAB — BRAIN NATRIURETIC PEPTIDE: Pro B Natriuretic peptide (BNP): 108 pg/mL — ABNORMAL HIGH (ref 0.0–100.0)

## 2021-10-18 MED ORDER — TORSEMIDE 20 MG PO TABS
20.0000 mg | ORAL_TABLET | Freq: Every day | ORAL | 0 refills | Status: DC
Start: 2021-10-18 — End: 2022-05-31

## 2021-10-18 MED ORDER — LISINOPRIL 20 MG PO TABS: 20.0000 mg | ORAL_TABLET | Freq: Every day | ORAL | 0 refills | Status: DC

## 2021-10-18 NOTE — Patient Instructions (Addendum)
? ? ? ?  Blood work was ordered.  Chest xray ordered ? ? ?Medications changes include :   continue torsemide 20 mg daily.  Change to quinapril to lisinopril.   ? ? ? ? ? ?Return if symptoms worsen or fail to improve. ? ?

## 2021-10-18 NOTE — Telephone Encounter (Signed)
Patient has been made aware. She saw PCP today. PCP has been made the switch for her to Lisinopril 20 mg once daily.  ? ?Will call the patient back on Friday and make an appointment with Dr. Sallyanne Kuster.  ?

## 2021-10-19 DIAGNOSIS — E119 Type 2 diabetes mellitus without complications: Secondary | ICD-10-CM | POA: Diagnosis not present

## 2021-10-19 DIAGNOSIS — Z7984 Long term (current) use of oral hypoglycemic drugs: Secondary | ICD-10-CM | POA: Diagnosis not present

## 2021-10-19 DIAGNOSIS — Z872 Personal history of diseases of the skin and subcutaneous tissue: Secondary | ICD-10-CM | POA: Diagnosis not present

## 2021-10-20 NOTE — Telephone Encounter (Signed)
Returned the call to the patient. She stated that she will call back when she can make an appointment. She was unavailable at that time.  ?

## 2021-10-21 DIAGNOSIS — R5383 Other fatigue: Secondary | ICD-10-CM | POA: Diagnosis not present

## 2021-10-21 DIAGNOSIS — R2681 Unsteadiness on feet: Secondary | ICD-10-CM | POA: Diagnosis not present

## 2021-10-23 ENCOUNTER — Other Ambulatory Visit: Payer: Self-pay | Admitting: Internal Medicine

## 2021-10-23 DIAGNOSIS — M25652 Stiffness of left hip, not elsewhere classified: Secondary | ICD-10-CM | POA: Diagnosis not present

## 2021-10-23 DIAGNOSIS — M6281 Muscle weakness (generalized): Secondary | ICD-10-CM | POA: Diagnosis not present

## 2021-10-23 DIAGNOSIS — M25552 Pain in left hip: Secondary | ICD-10-CM | POA: Diagnosis not present

## 2021-10-25 DIAGNOSIS — M25652 Stiffness of left hip, not elsewhere classified: Secondary | ICD-10-CM | POA: Diagnosis not present

## 2021-10-25 DIAGNOSIS — M25552 Pain in left hip: Secondary | ICD-10-CM | POA: Diagnosis not present

## 2021-10-25 DIAGNOSIS — M6281 Muscle weakness (generalized): Secondary | ICD-10-CM | POA: Diagnosis not present

## 2021-10-30 ENCOUNTER — Other Ambulatory Visit: Payer: Self-pay

## 2021-10-30 ENCOUNTER — Encounter: Payer: Self-pay | Admitting: Podiatry

## 2021-10-30 ENCOUNTER — Ambulatory Visit (INDEPENDENT_AMBULATORY_CARE_PROVIDER_SITE_OTHER): Payer: HMO | Admitting: Podiatry

## 2021-10-30 DIAGNOSIS — L608 Other nail disorders: Secondary | ICD-10-CM

## 2021-10-30 DIAGNOSIS — M25652 Stiffness of left hip, not elsewhere classified: Secondary | ICD-10-CM | POA: Diagnosis not present

## 2021-10-30 DIAGNOSIS — M25552 Pain in left hip: Secondary | ICD-10-CM | POA: Diagnosis not present

## 2021-10-30 DIAGNOSIS — B351 Tinea unguium: Secondary | ICD-10-CM

## 2021-10-30 DIAGNOSIS — M79674 Pain in right toe(s): Secondary | ICD-10-CM

## 2021-10-30 DIAGNOSIS — M79675 Pain in left toe(s): Secondary | ICD-10-CM | POA: Diagnosis not present

## 2021-10-30 DIAGNOSIS — E1142 Type 2 diabetes mellitus with diabetic polyneuropathy: Secondary | ICD-10-CM | POA: Diagnosis not present

## 2021-10-30 DIAGNOSIS — M6281 Muscle weakness (generalized): Secondary | ICD-10-CM | POA: Diagnosis not present

## 2021-10-30 NOTE — Progress Notes (Signed)
This patient returns to my office for at risk foot care.  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 ?This patient is unable to cut nails herself since the patient cannot reach her  nails.These nails are painful walking and wearing shoes. She has hip resection 3-4 weeks ago. This patient presents for at risk foot care today. ? ?General Appearance  Alert, conversant and in no acute stress. ? ?Vascular  Dorsalis pedis and posterior tibial  pulses are palpable  bilaterally.  Capillary return is within normal limits  bilaterally. Temperature is within normal limits  bilaterally. ? ?Neurologic  Senn-Weinstein monofilament wire test within normal limits  bilaterally. Muscle power within normal limits bilaterally. ? ?Nails Thick disfigured discolored nails with subungual debris  from second to fifth toes bilaterally. No evidence of bacterial infection or drainage bilaterally. ? ?Orthopedic  No limitations of motion  feet .  No crepitus or effusions noted.  No bony pathology or digital deformities noted. ? ?Skin  normotropic skin with no porokeratosis noted bilaterally.  No signs of infections or ulcers noted.    ? ?Onychomycosis  Pain in right toes  Pain in left toes ? ?Consent was obtained for treatment procedures.   Mechanical debridement of nails 1-5  bilaterally performed with a nail nipper.  Filed with dremel without incident.  ? ? ?Return office visit  3 months                     Told patient to return for periodic foot care and evaluation due to potential at risk complications. ? ? ?Gardiner Barefoot DPM   ?

## 2021-11-01 DIAGNOSIS — M25552 Pain in left hip: Secondary | ICD-10-CM | POA: Diagnosis not present

## 2021-11-01 DIAGNOSIS — M25652 Stiffness of left hip, not elsewhere classified: Secondary | ICD-10-CM | POA: Diagnosis not present

## 2021-11-01 DIAGNOSIS — M6281 Muscle weakness (generalized): Secondary | ICD-10-CM | POA: Diagnosis not present

## 2021-11-05 ENCOUNTER — Encounter: Payer: Self-pay | Admitting: Internal Medicine

## 2021-11-10 ENCOUNTER — Other Ambulatory Visit: Payer: Self-pay

## 2021-11-10 MED ORDER — ACCU-CHEK SOFTCLIX LANCETS MISC: 12 refills | Status: DC

## 2021-11-10 MED ORDER — FREESTYLE LITE TEST VI STRP: ORAL_STRIP | 12 refills | Status: DC

## 2021-11-16 DIAGNOSIS — Z7984 Long term (current) use of oral hypoglycemic drugs: Secondary | ICD-10-CM | POA: Diagnosis not present

## 2021-11-16 DIAGNOSIS — E119 Type 2 diabetes mellitus without complications: Secondary | ICD-10-CM | POA: Diagnosis not present

## 2021-11-20 DIAGNOSIS — M25552 Pain in left hip: Secondary | ICD-10-CM | POA: Diagnosis not present

## 2021-11-20 DIAGNOSIS — M6281 Muscle weakness (generalized): Secondary | ICD-10-CM | POA: Diagnosis not present

## 2021-11-20 DIAGNOSIS — M25652 Stiffness of left hip, not elsewhere classified: Secondary | ICD-10-CM | POA: Diagnosis not present

## 2021-11-22 DIAGNOSIS — M25652 Stiffness of left hip, not elsewhere classified: Secondary | ICD-10-CM | POA: Diagnosis not present

## 2021-11-22 DIAGNOSIS — M25552 Pain in left hip: Secondary | ICD-10-CM | POA: Diagnosis not present

## 2021-11-22 DIAGNOSIS — M6281 Muscle weakness (generalized): Secondary | ICD-10-CM | POA: Diagnosis not present

## 2021-11-27 ENCOUNTER — Ambulatory Visit: Payer: HMO | Admitting: Neurology

## 2021-11-27 ENCOUNTER — Encounter: Payer: Self-pay | Admitting: Neurology

## 2021-11-27 ENCOUNTER — Telehealth: Payer: Self-pay | Admitting: *Deleted

## 2021-11-27 VITALS — BP 111/57 | HR 80 | Ht 62.0 in | Wt 173.5 lb

## 2021-11-27 DIAGNOSIS — I4811 Longstanding persistent atrial fibrillation: Secondary | ICD-10-CM

## 2021-11-27 DIAGNOSIS — G2581 Restless legs syndrome: Secondary | ICD-10-CM

## 2021-11-27 DIAGNOSIS — I3139 Other pericardial effusion (noninflammatory): Secondary | ICD-10-CM | POA: Diagnosis not present

## 2021-11-27 DIAGNOSIS — I509 Heart failure, unspecified: Secondary | ICD-10-CM | POA: Diagnosis not present

## 2021-11-27 DIAGNOSIS — D649 Anemia, unspecified: Secondary | ICD-10-CM | POA: Diagnosis not present

## 2021-11-27 MED ORDER — ROPINIROLE HCL ER 2 MG PO TB24: 2.0000 mg | ORAL_TABLET | Freq: Every day | ORAL | 5 refills | Status: DC

## 2021-11-27 NOTE — Telephone Encounter (Signed)
Pt here today for visit. Reports she received letter stating requip needing approval through insurance. Need to call [facility] at [phone]. ? ?I called [employer] and spoke w/ Raquel Sarna. She is showing medication out of refills. She is on requip '1mg'$  prn up to 3/day. States insurance pays for medication, just need to send new prescription. ?

## 2021-11-27 NOTE — Progress Notes (Addendum)
?S[facility]INE [facility] ? ? ?Provider:  Larey Seat, MD  ?Referring Provider: Hoyt Koch, * ?Primary Care Physician:  Hoyt Koch, MD ? ?Chief Complaint  ?Patient presents with  ? Follow-up  ?  RM 11, alone. Last seen 06/15/21. Ambulates with walker.   ? ? ?Rv 11-27-2021; [name] is a patient with RLS and  chronic pain, and has advanced from wheelchair  to walker- she is on many, many medications that will cause hypersomnia, fatigue. Naps often. ?Gabapentin was not working by itself for RLS .  Pramipexole was not effective.  ?She reports some daytime restless legs onset when in her recliner, and 90% of RLS manifested at night, in bed.  ?Pt states again that  the prescription for RLS medication at  2 mg ER was denied by insurance. Takes requip 1 mg tab allowed  3 times a day- can we finally allow this patient to go to the XR form?  ?She has been bothered by RLS, neuropathy and gait disorder. ? ? ?Rv 06-15-2021: RV with [name], 71 year old female patient with recent hospitalization.  ?The patient had a fall in July 2022, a months o after last visit me . which fractured right hip and shoulder, went to [facility] and from there to Rehab, but outpatient at home. She feels her PT pushed her too hard.  She suffered a new injury in PT , she reported ,now presents in a wheelchair is in pain.  ?She can't sleep flat anymore, she has a new chair, recliner . ? She feels bothered by her RLS, stretching , moving causes pain but she cannot suppress the urge. She has calf pain on the left shin pain and her skin looks tight,  ? ? ? ?01-12-2021: RV : 38 -year-old Caucasian and Native American patient presents for yearly medication management f/u. Overall stable.  She had a severe decline by Christmas- 2021- until March 2022. ?Pt states that with the RLS the 2 mg ER was denied by insurance. She has continued taking the 0.5 mg tablet and then additional one if needed (was never ordered the 1 mg dose to  take between dinner and bedtime). She has noted dizziness and sleepiness that are possible side effects to medications, thankfully has not not fallen. Chronic respiratory failure. CHF. She has a history of pericardial effusion, had anemia- had transfusion, 3-31-2022_ferritin was 10.2 . ? H and H was 10.4 g/ 32% - but walking was limited by swelling in feet and legs. Rectal bleeding. She is very easily SOB and her hips give out- has been limited in her walking ability. Dr Ellene Route sent her to PT for gait. Vit B 12 is low. GFR is low. Hyponatremia is present.  ?FSS 51 points, Epworth at 12/ 24 years, 4/ 15 depression scale.  ?Paxil may contribute to RLS and I would like to change to 10 mg for 7 days, then start Prozac. She will wait until dr Sharlet Salina speaks to her.  ? ?11-02-2019; [name] is a 71 year old Caucasian patient of Dr. Pricilla Holm, MD.  She presents today after a 72-monthhiatus.  The last time I saw her she had pericardial effusions, developed headaches dizziness and had fallen with a large hematoma to the right temple.  She states now that she has not  continued to-  fall to  fall out of bed. ?As she reports that there was a flurry of falls out of bed in June, July and August  2020.  She has a history of chronic atrial fibrillation.  She has regained fluid weight is abdominal distention.  She has a remote history of smoking over 25 years ago she quit.  She is tachycardic here today as she checked in and her blood pressure was elevated 152/90, pulse rate was 124.  She has been responding well to the treatment of restless legs with medication.  She endorsed the Epworth Sleepiness Scale at 6 out of 24 points which is a good response, she reports a high degree of fatigue. She is trying very day to walk a little more distance.  ?She will see Dr. Benn Moulder next month, blood work is next week.  ? ? ?03-05-2019,  ?[name] is a  71 year old caucasian left handed patient. Presenting for urgent  Visit.  ?Interval history for anew concern of confusion, delayed word retrieval following a fall out of bed, head injury.  She had a headache , a little nausea and about a day later she developed headaches, dizziness and a large hematoma on the right temple.  ? ?There has been a progression in her heart failure.  Her interval history is quite remarkable patient informed me in a MyChart message that she had been admitted to [facility] on May 31 after developing increasing shortness of breath and extreme weakness.  She was found to have pulmonary edema, atrial fibrillation and pericardial effusion was evaluated for underlying malignancy.  She had almost 2 L of fluid drained from the pericardium and lives she stated.  She was in the hospital for belly a week about the third week after she returned home she had a fall out of bed.  The CT of the chest abdomen and pelvis was performed without contrast documented small amount of ascites primarily in the pelvis bilateral flank edema anasarca with bilateral pleural effusions, ascites and bilateral flank edema.  A previous CT had already described a similar small nodule to the left adrenal gland this has not changed and is likely incidental.  The previous CT was from 2003.  CT of the chest showed that the heart was mildly enlarged.there was still a small residual pericardial effusion and the mediastinum did not show enlarged lymph nodes there were pleural effusions small left more than right atelectasis in both lower lobes.  No infiltration, no pancreatic abnormality noted normal spleen, normal bowel anatomy.  The studies are from 14 January 2019. ?The discharge summary dated 16 January 2019 stated that the patient would be on fluid restriction she was discharged on torsemide, on Cipro, she will be followed for elevated liver function tests-transaminases, and her work-up for anemia will be continued.  At the time there was no neurologic concerns this arose from the fall 3 weeks  later. ? ? ? ? ?HPI: 06-10-2017, I had the opportunity of meeting [name] on February 28 last year, about 20 months ago. In the meantime she would like to be further evaluated a past medical history has not significantly changed. Her chief complaints of neither. She continues to be treated on Xarelto for atrial fibrillation with rapid ventricular response, takes Cardizem for rate control, gabapentin, estradiol, Invokana, Symbicort inhaler, Janumet, she still taking Paxil, but she is no longer taking Ultram. ? ?She is still in need of a formal sleep evaluation, her atrial fibrillation has been difficult to control. She has failed 4 cardio-versions. Could not take Tikosyn as it interferes with her paxil.  ?insisted on continuing Paxil, out of fear to have  panic attacks again. 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 She lives with her wife, has a supportive sister. She adopted 2 children.   ? ? ? ?HPI:  [name] is a 71 y.o. female , seen here as a referral from Dr. Sharlet Salina for a sleep consultation,  ? ?Chief complaint according to patient : " insomnia- difficulties to go to sleep , but I can stay asleep" . 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  ?[name] is a patient with atrial fibrillation and sleep initiation insomnia, she also had a Bell's palsy in 2001, a cholecystectomy in 2012 cardiac stenting in 2009 and a hysterectomy in 1982. Her past medical history includes hypertension, diabetes, hypercholesterolemia, heart disease atrial fibrillation, anxiety and migraine. In her review of systems she endorsed further easy bruising, shortness of breath, chest pain and palpitation being aware of a cardiac murmur, restless legs and headaches. It was upon further recommendation of her cardiologist at the atrial fibrillation clinic at [facility] where she was recommended to undergo a sleep study. The patient was educated about the correlation between atrial fibrillation and  obstructive sleep apnea. She sees Dr. Quay Burow. She has a panic disorder and takes Ativan.  ? ?Paroxysmal atrial fibrillation was first diagnosed in May 2015 with a successful conversion ?2 first o

## 2021-11-27 NOTE — Patient Instructions (Addendum)
Assessment:  After physical and neurologic examination, review of laboratory studies,  Personal review of imaging studies, reports of other /same  Imaging studies ,  Results of polysomnography/ neurophysiology testing and pre-existing records as far as provided in visit., my assessment is : ? ?0) PLMs, RLS -  Can be partly due to spinal stenosis, but she responded well to Requip and transfusions.  ?Refilled Requip. Increased to 1 mg up to 3 a day. Now exacerbated after a fracture due to a fall.  She fell from the side on the bed. She fell asleep and fell forward!  ?She has not had relief of RLS on Mirapex or on gabapentin alone.  ? ? ?1)She is fatigued,, FSS at 46/ 63 points, related to polypharmacy  ? ?2) Her memory and word retrieval is continuesly improving.  We will repeat MOCA/ MMSE in the next visits.  ? ?3)  recovered from recurrent anemia, recurrent blood loss, and CHF, CKD,  pericardial effusion - No malignancy found. SOB, she felt great after infusion!  ?Her CHF related to Atrial fibrillation, persistent  -She cannot undergo ablation while in almost constant atrial fibrillation- and her a fib has taken her energy away, her stamina.She just regained a lot of fluid.  ? ? ? ? ?Plan:  Treatment plan and additional workup : ? ?I will be happy to refill  Requip but instead of 3 times a day 1 mg IR, I like to change her to XR form at 2 mg- we may go to 4 mg in the future. RLS is still present and she often used a middle of the night dose extra.  ?I also refilled trazodone.  ? ?Rv in 6-8 months with Np.  ? ? ? ? ?Asencion Partridge Rashi Giuliani MD ? ?11/27/2021 ? ? ?CC: Janice Koch, Md ?[place],  [place] [id number] ? ? ? ? ? ?Restless Legs Syndrome ?Restless legs syndrome is a condition that causes uncomfortable feelings or sensations in the legs, especially while sitting or lying down. The sensations usually cause an overwhelming urge to move the legs. The arms can also sometimes be affected. ?The  condition can range from mild to severe. The symptoms often interfere with a person's ability to sleep. ?What are the causes? ?The cause of this condition is not known. ?What increases the risk? ?The following factors may make you more likely to develop this condition: ?Being older than 50. ?Pregnancy. ?Being a woman. In general, the condition is more common in women than in men. ?A family history of the condition. ?Having iron deficiency. ?Overuse of caffeine, nicotine, or alcohol. ?Certain medical conditions, such as kidney disease, Parkinson's disease, or nerve damage. ?Certain medicines, such as those for high blood pressure, nausea, colds, allergies, depression, and some heart conditions. ?What are the signs or symptoms? ?The main symptom of this condition is uncomfortable sensations in the legs, such as: ?Pulling. ?Tingling. ?Prickling. ?Throbbing. ?Crawling. ?Burning. ?Usually, the sensations: ?Affect both sides of the body. ?Are worse when you sit or lie down. ?Are worse at night. These may make it difficult to fall asleep. ?Make you have a strong urge to move your legs. ?Are temporarily relieved by moving your legs or standing. ?The arms can also be affected, but this is rare. People who have this condition often have tiredness during the day because of their lack of sleep at night. ?How is this diagnosed? ?This condition may be diagnosed based on: ?Your symptoms. ?Blood tests. ?In some cases, you may be  monitored in a sleep lab by a specialist (a sleep study). This can detect any disruptions in your sleep. ?How is this treated? ?This condition is treated by managing the symptoms. This may include: ?Lifestyle changes, such as exercising, using relaxation techniques, and avoiding caffeine, alcohol, or tobacco. ?Iron supplements. ?Medicines. Parkinson's medications may be tried first. Anti-seizure medications can also be helpful. ?Follow these instructions at home: ?General instructions ?Take over-the-counter  and prescription medicines only as told by your health care provider. ?Use methods to help relieve the uncomfortable sensations, such as: ?Massaging your legs. ?Walking or stretching. ?Taking a cold or hot bath. ?Keep all follow-up visits. This is important. ?Lifestyle ? ?  ? ?Practice good sleep habits. For example, go to bed and get up at the same time every day. Most adults should get 7-9 hours of sleep each night. ?Exercise regularly. Try to get at least 30 minutes of exercise most days of the week. ?Practice ways of relaxing, such as yoga or meditation. ?Avoid caffeine and alcohol. ?Do not use any products that contain nicotine or tobacco. These products include cigarettes, chewing tobacco, and vaping devices, such as e-cigarettes. If you need help quitting, ask your health care provider. ?Where to find more information ?[employer] of Neurological Disorders and Stroke: [employer]Boxes[employer]it ?Contact a health care provider if: ?Your symptoms get worse or they do not improve with treatment. ?Summary ?Restless legs syndrome is a condition that causes uncomfortable feelings or sensations in the legs, especially while sitting or lying down. ?The symptoms often interfere with your ability to sleep. ?This condition is treated by managing the symptoms. You may need to make lifestyle changes or take medicines. ?This information is not intended to replace advice given to you by your health care provider. Make sure you discuss any questions you have with your health care provider. ?Document Revised: 03/12/2021 Document Reviewed: 03/12/2021 ?Elsevier Patient Education ? [place]. ? ?

## 2021-11-28 ENCOUNTER — Other Ambulatory Visit: Payer: Self-pay | Admitting: Internal Medicine

## 2021-11-28 NOTE — Telephone Encounter (Signed)
Med never been refilled by PCP. Is this ok refill.Marland KitchenJohny Chess ?

## 2021-11-29 DIAGNOSIS — M6281 Muscle weakness (generalized): Secondary | ICD-10-CM | POA: Diagnosis not present

## 2021-11-29 DIAGNOSIS — M25552 Pain in left hip: Secondary | ICD-10-CM | POA: Diagnosis not present

## 2021-11-29 DIAGNOSIS — M25652 Stiffness of left hip, not elsewhere classified: Secondary | ICD-10-CM | POA: Diagnosis not present

## 2021-11-30 DIAGNOSIS — M25552 Pain in left hip: Secondary | ICD-10-CM | POA: Diagnosis not present

## 2021-11-30 DIAGNOSIS — M6281 Muscle weakness (generalized): Secondary | ICD-10-CM | POA: Diagnosis not present

## 2021-11-30 DIAGNOSIS — M25652 Stiffness of left hip, not elsewhere classified: Secondary | ICD-10-CM | POA: Diagnosis not present

## 2021-12-04 DIAGNOSIS — M25652 Stiffness of left hip, not elsewhere classified: Secondary | ICD-10-CM | POA: Diagnosis not present

## 2021-12-04 DIAGNOSIS — M6281 Muscle weakness (generalized): Secondary | ICD-10-CM | POA: Diagnosis not present

## 2021-12-04 DIAGNOSIS — M25552 Pain in left hip: Secondary | ICD-10-CM | POA: Diagnosis not present

## 2021-12-05 DIAGNOSIS — H2513 Age-related nuclear cataract, bilateral: Secondary | ICD-10-CM | POA: Diagnosis not present

## 2021-12-06 DIAGNOSIS — M25652 Stiffness of left hip, not elsewhere classified: Secondary | ICD-10-CM | POA: Diagnosis not present

## 2021-12-06 DIAGNOSIS — M25552 Pain in left hip: Secondary | ICD-10-CM | POA: Diagnosis not present

## 2021-12-06 DIAGNOSIS — M6281 Muscle weakness (generalized): Secondary | ICD-10-CM | POA: Diagnosis not present

## 2021-12-11 ENCOUNTER — Ambulatory Visit: Payer: HMO | Admitting: Podiatry

## 2021-12-11 ENCOUNTER — Other Ambulatory Visit: Payer: Self-pay | Admitting: Cardiovascular Disease

## 2021-12-14 DIAGNOSIS — M6281 Muscle weakness (generalized): Secondary | ICD-10-CM | POA: Diagnosis not present

## 2021-12-14 DIAGNOSIS — M25652 Stiffness of left hip, not elsewhere classified: Secondary | ICD-10-CM | POA: Diagnosis not present

## 2021-12-14 DIAGNOSIS — M25552 Pain in left hip: Secondary | ICD-10-CM | POA: Diagnosis not present

## 2021-12-15 DIAGNOSIS — M6281 Muscle weakness (generalized): Secondary | ICD-10-CM | POA: Diagnosis not present

## 2021-12-15 DIAGNOSIS — M25552 Pain in left hip: Secondary | ICD-10-CM | POA: Diagnosis not present

## 2021-12-15 DIAGNOSIS — M25652 Stiffness of left hip, not elsewhere classified: Secondary | ICD-10-CM | POA: Diagnosis not present

## 2021-12-18 DIAGNOSIS — M6281 Muscle weakness (generalized): Secondary | ICD-10-CM | POA: Diagnosis not present

## 2021-12-18 DIAGNOSIS — M25552 Pain in left hip: Secondary | ICD-10-CM | POA: Diagnosis not present

## 2021-12-18 DIAGNOSIS — M25652 Stiffness of left hip, not elsewhere classified: Secondary | ICD-10-CM | POA: Diagnosis not present

## 2021-12-20 DIAGNOSIS — R197 Diarrhea, unspecified: Secondary | ICD-10-CM | POA: Diagnosis not present

## 2021-12-20 DIAGNOSIS — D500 Iron deficiency anemia secondary to blood loss (chronic): Secondary | ICD-10-CM | POA: Diagnosis not present

## 2021-12-20 DIAGNOSIS — R109 Unspecified abdominal pain: Secondary | ICD-10-CM | POA: Diagnosis not present

## 2021-12-22 DIAGNOSIS — M25552 Pain in left hip: Secondary | ICD-10-CM | POA: Diagnosis not present

## 2021-12-22 DIAGNOSIS — M25652 Stiffness of left hip, not elsewhere classified: Secondary | ICD-10-CM | POA: Diagnosis not present

## 2021-12-22 DIAGNOSIS — M6281 Muscle weakness (generalized): Secondary | ICD-10-CM | POA: Diagnosis not present

## 2021-12-23 NOTE — Progress Notes (Signed)
HPI ?F former smoker last seen in 2020 for COPD with concern of possible DVT, complicated by EN2/ neuropathy, AFib, CHF,HBP, CAD/PCI, COPD, GERD, Hyperlipidemia, Restless Legs, Obesity, Glaucoma ?Hosp in June, 2020 with pericardial tamponade, cardiogenic shock, CHF. ?Echo 11/20/2018- EF 55-60%, mild RAE ?Walk Test 12/21/19- room air- lowest O2 sat 97%, max HR 126. ?NPSG 08/15/2017- AHI 0/ hr. desat to 89%/ mean 93%, PLMA 23.7/ hr, body weight 194 lbs ?ONOX 89% or less  For over 17 minutes ([facility]) ?PFT 09/30/20--Minimal obstruction (refused bronchodilator trial), possible min restriction, DLCO mild to mod reduction ?PFT 09/30/20--Minimal obstruction (refused bronchodilator trial), possible min restriction, DLCO mild to mod reduction ?ONOX 09/04/20 RA- Did not qualify- minimum O2 sat 90% ?------------------------------------------------------------------------------ ? ? ?06/26/21- 71yoF former smoker followed  for COPD , complicated by DP8/ neuropathy, AFib/ Xarelto, CHF,HBP, CAD/PCI, GERD, Hyperlipidemia, Restless Legs, Obesity, Glaucoma, Hx DVT, GIB/ Anemia,  ?Sleeping in recliner- hip won't let her get in and out of her bed. ?-Stiolto 2.5 Respimat, Anoro ?Body weight today- ?Covid vax- 4 Phizers   ?Flu vax-today ?Needs surgical clearance- Dr Mayer Camel L hip arthroplasty/ spinal ?Arrival O2 sat today 94% ?Breathing has been very good- no cough or phlegm. No acute episodes. Anoro is working well as her only inhaled medication. ?Already cleared by Cardiology.  ?Being managed by GI and PCP for chronic anemia. I discussed how that impacts tissue oxygen delivery.  ?Sleeping in a recliner. Hip pain prevents her from climbing into her bed.  ?CXR 1V 02/28/21-  ?IMPRESSION: ?1. No acute cardiopulmonary disease. ?2. Left humeral neck fracture ? ?12/25/21-  72yoF former smoker followed  for COPD , complicated by EU2/ neuropathy, AFib/ Xarelto, CHF,HBP, CAD/PCI, GERD, Hyperlipidemia, Restless Legs(Dr Dohmeier), Obesity, Glaucoma, Hx DVT,  GIB/ Anemia,  ?Sleeping in recliner- hip won't let her get in and out of her bed. ?- Anoro ?Body weight today-176 lbs ?Covid vax- 4 Phizers   ?Flu vax-had ?------Patient having shortness of breath with exertion more than normal. Patient also had hip replaced so is working on being more active.  ?THR in February. Slowly increasing activity but interested in pulmonary rehab. Somedry cough. No acute event, chest pain or phlegm. Chronic AFib- contemplating Watchman procedure. Continues Xarelto ?CXR 10/18/21- ?FINDINGS: ?Heart size upper normal. Negative for heart failure or edema. ?Atherosclerotic aorta. Lungs clear without infiltrate or effusion. ?IMPRESSION: ?No active cardiopulmonary disease. ? ? ?ROS-see HPI   + = positive ?Constitutional:    weight loss+ last year, night sweats, fevers, chills, fatigue, lassitude. ?HEENT:    headaches, difficulty swallowing, tooth/dental problems, sore throat,  ?     sneezing, itching, ear ache, nasal congestion, post nasal drip, snoring ?CV:    chest pain, orthopnea, PND, +swelling in lower extremities, anasarca,                                   ?dizziness, +palpitations ?Resp:   +shortness of breath with exertion or at rest.   ?             productive cough,   +non-productive cough, coughing up of blood.   ?           change in color of mucus.  wheezing.   ?Skin:    rash or lesions. ?GI:  No-   heartburn, indigestion, abdominal pain, nausea, vomiting, diarrhea,  ?               change in bowel  habits, lo+ss of appetite ?GU: dysuria, change in color of urine, no urgency or frequency.   flank pain. ?MS:   joint pain, stiffness, decreased range of motion, back pain. ?Neuro-     nothing unusual ?Psych:  change in mood or affect.  depression or anxiety.   memory loss. ? ?OBJ- Physical Exam ?General- Alert, Oriented, Affect-appropriate, Distress- none acute, obese+ ?Skin- rash-none, lesions- none, excoriation- none ?Lymphadenopathy- none ?Head- atraumatic ?           Eyes- Gross vision  intact, PERRLA, conjunctivae and secretions clear ?           Ears- Hearing, canals-normal ?           Nose- Clear, no-Septal dev, mucus, polyps, erosion, perforation  ?           Throat- Mallampati II , mucosa clear , drainage- none, tonsils- atrophic ?Neck- flexible , trachea midline, no stridor , thyroid nl, carotid no bruit ?Chest - symmetrical excursion , unlabored ?          Heart/CV- IRR/AFib+ /Slow- almost regular, no murmur , no gallop  , no rub, nl s1 s2 ?                          - JVD- none , edema- none, stasis changes+, varices-+ superficial ?          Lung- clear to P&A/ no rales, wheeze- none, cough- none , dullness-none, rub- none ?          Chest wall-  ?Abd-  ?Br/ Gen/ Rectal- Not done, not indicated ?Extrem- cyanosis- none, clubbing, none, atrophy- none, strength- nl ?Neuro- grossly intact to observation ? ?  ? ? ?

## 2021-12-25 ENCOUNTER — Ambulatory Visit: Payer: HMO | Admitting: Internal Medicine

## 2021-12-25 ENCOUNTER — Encounter: Payer: Self-pay | Admitting: Internal Medicine

## 2021-12-25 VITALS — BP 138/68 | HR 73 | Temp 97.8°F | Ht 62.0 in | Wt 176.0 lb

## 2021-12-25 DIAGNOSIS — I4821 Permanent atrial fibrillation: Secondary | ICD-10-CM

## 2021-12-25 DIAGNOSIS — J449 Chronic obstructive pulmonary disease, unspecified: Secondary | ICD-10-CM | POA: Diagnosis not present

## 2021-12-25 MED ORDER — ALBUTEROL SULFATE HFA 108 (90 BASE) MCG/ACT IN AERS: 2.0000 | INHALATION_SPRAY | Freq: Four times a day (QID) | RESPIRATORY_TRACT | 12 refills | Status: DC | PRN

## 2021-12-25 MED ORDER — UMECLIDINIUM-VILANTEROL 62.5-25 MCG/ACT IN AEPB
1.0000 | INHALATION_SPRAY | Freq: Every day | RESPIRATORY_TRACT | 12 refills | Status: DC
Start: 2021-12-25 — End: 2023-04-27

## 2021-12-25 NOTE — Assessment & Plan Note (Addendum)
I think her ongoing DOE reflects deconditioning and chronic AFib. On Xarelto, making PE unlikely. ?CXR was clear in March with no symptom change since then. ?Plan- pulmonary rehab referral. Refill Anoro and rescue inhaler ?

## 2021-12-25 NOTE — Patient Instructions (Addendum)
Order- refer to [facility]    dx COPD mixed type ? ?Scripts sent for Anoro maintenance inhaler and albuterol hfa rescue inhaler ? ?Please call if we can help ? ? ?

## 2021-12-25 NOTE — Assessment & Plan Note (Signed)
She is considering Watchman device. ?

## 2021-12-26 DIAGNOSIS — M6281 Muscle weakness (generalized): Secondary | ICD-10-CM | POA: Diagnosis not present

## 2021-12-26 DIAGNOSIS — M25652 Stiffness of left hip, not elsewhere classified: Secondary | ICD-10-CM | POA: Diagnosis not present

## 2021-12-26 DIAGNOSIS — M25552 Pain in left hip: Secondary | ICD-10-CM | POA: Diagnosis not present

## 2021-12-28 DIAGNOSIS — M6281 Muscle weakness (generalized): Secondary | ICD-10-CM | POA: Diagnosis not present

## 2021-12-28 DIAGNOSIS — M25652 Stiffness of left hip, not elsewhere classified: Secondary | ICD-10-CM | POA: Diagnosis not present

## 2021-12-28 DIAGNOSIS — M25552 Pain in left hip: Secondary | ICD-10-CM | POA: Diagnosis not present

## 2021-12-29 ENCOUNTER — Telehealth (HOSPITAL_COMMUNITY): Payer: Self-pay

## 2021-12-29 NOTE — Telephone Encounter (Signed)
Called patient to see if she is interested in the Pulmonary Rehab Program. Patient expressed interest. Explained scheduling process, patient verbalized understanding.  ?

## 2021-12-29 NOTE — Telephone Encounter (Signed)
Pt insurance is active and benefits verified through [employer]. Co-pay $0.00, DED $0.00/$0.00 met, out of pocket $5,000.00/$690.08 met, co-insurance 0%. No pre-authorization required. Ayush/[employer], 12/29/21 @ 9:25AM, FDV#[id number]   Will contact patient to see if she is interested in the Pulmonary Rehab Program.

## 2022-01-05 ENCOUNTER — Telehealth: Payer: Self-pay

## 2022-01-05 NOTE — Telephone Encounter (Signed)
Not on patients current medication list

## 2022-01-05 NOTE — Telephone Encounter (Signed)
Pt is requesting a refill on:  celecoxib (CELEBREX) capsule 200 mg     Pharmacy: [facility], [place] 07/19/21 ROV 01/19/22

## 2022-01-05 NOTE — Telephone Encounter (Signed)
She was advised by her orthopedic to stop this medication so would likely need visit to discuss risk and benefit at this time.

## 2022-01-09 DIAGNOSIS — R197 Diarrhea, unspecified: Secondary | ICD-10-CM | POA: Diagnosis not present

## 2022-01-09 DIAGNOSIS — R109 Unspecified abdominal pain: Secondary | ICD-10-CM | POA: Diagnosis not present

## 2022-01-09 NOTE — Telephone Encounter (Signed)
See my chart message

## 2022-01-19 ENCOUNTER — Encounter: Payer: Self-pay | Admitting: Internal Medicine

## 2022-01-19 ENCOUNTER — Ambulatory Visit (INDEPENDENT_AMBULATORY_CARE_PROVIDER_SITE_OTHER): Payer: HMO | Admitting: Internal Medicine

## 2022-01-19 DIAGNOSIS — Q2733 Arteriovenous malformation of digestive system vessel: Secondary | ICD-10-CM

## 2022-01-19 DIAGNOSIS — M1990 Unspecified osteoarthritis, unspecified site: Secondary | ICD-10-CM | POA: Diagnosis not present

## 2022-01-19 MED ORDER — CELECOXIB 100 MG PO CAPS: 100.0000 mg | ORAL_CAPSULE | Freq: Two times a day (BID) | ORAL | 1 refills | Status: DC

## 2022-01-19 NOTE — Assessment & Plan Note (Signed)
We talked about how she is high risk for GI oozing with resultant low iron and blood counts in the future. We will monitor CBC and ferritin regularly since we will be starting celebrex.

## 2022-01-19 NOTE — Assessment & Plan Note (Signed)
She has previously had good control with celebrex 200 mg BID. We have talked previously about risk/benefit. She has been off this for 1 year due to some bleeding and other reasons. The main risks explained are risk of GI bleeding (more given interaction with xarelto), CV risk (not unique to celebrex with any NSAID) and CKD progression. She does feel that her QOL is signficantly improved with this medication and she wishes to proceed despite risk. We will try celebrex 100 mg BID since she has been off for some time this may be effective. We can increase to 200 mg BID if needed although increased dose does increase risk of GI bleeding. We talked about signs of heart attack, GI bleeding to be alert to and seek medical care immediately for.

## 2022-01-19 NOTE — Progress Notes (Signed)
   Subjective:   Patient ID: [name], female    DOB: [birth year], 71 y.o.   MRN: [id number]  HPI The patient is a 71 YO female coming in for concerns about arthritis.   Review of Systems  Constitutional:  Positive for appetite change and fatigue.  HENT: Negative.    Eyes: Negative.   Respiratory:  Positive for shortness of breath. Negative for cough and chest tightness.   Cardiovascular:  Negative for chest pain, palpitations and leg swelling.  Gastrointestinal:  Positive for abdominal distention. Negative for abdominal pain, constipation, diarrhea, nausea and vomiting.  Musculoskeletal:  Positive for arthralgias and myalgias.  Skin: Negative.   Neurological: Negative.   Psychiatric/Behavioral: Negative.      Objective:  Physical Exam Constitutional:      Appearance: She is well-developed.  HENT:     Head: Normocephalic and atraumatic.  Cardiovascular:     Rate and Rhythm: Normal rate and regular rhythm.  Pulmonary:     Effort: Pulmonary effort is normal. No respiratory distress.     Breath sounds: Normal breath sounds. No wheezing or rales.  Abdominal:     General: Bowel sounds are normal. There is distension.     Palpations: Abdomen is soft.     Tenderness: There is no abdominal tenderness. There is no rebound.  Musculoskeletal:     Cervical back: Normal range of motion.  Skin:    General: Skin is warm and dry.  Neurological:     Mental Status: She is alert and oriented to person, place, and time.     Coordination: Coordination abnormal.     Comments: walker     Vitals:   01/19/22 1607  BP: 118/84  Pulse: 93  Resp: 18  SpO2: 97%  Weight: 177 lb (80.3 kg)  Height: '5\' 2"'$  (1.575 m)    Assessment & Plan:

## 2022-01-19 NOTE — Patient Instructions (Signed)
We have sent in the celebrex 100 mg to take twice a day.

## 2022-01-27 ENCOUNTER — Other Ambulatory Visit: Payer: Self-pay | Admitting: Internal Medicine

## 2022-01-31 ENCOUNTER — Ambulatory Visit: Payer: HMO | Admitting: Podiatry

## 2022-01-31 ENCOUNTER — Ambulatory Visit (INDEPENDENT_AMBULATORY_CARE_PROVIDER_SITE_OTHER): Payer: HMO | Admitting: Podiatry

## 2022-01-31 ENCOUNTER — Encounter: Payer: Self-pay | Admitting: Podiatry

## 2022-01-31 ENCOUNTER — Telehealth: Payer: Self-pay | Admitting: Internal Medicine

## 2022-01-31 DIAGNOSIS — B351 Tinea unguium: Secondary | ICD-10-CM | POA: Diagnosis not present

## 2022-01-31 DIAGNOSIS — E1142 Type 2 diabetes mellitus with diabetic polyneuropathy: Secondary | ICD-10-CM

## 2022-01-31 DIAGNOSIS — L608 Other nail disorders: Secondary | ICD-10-CM | POA: Diagnosis not present

## 2022-01-31 DIAGNOSIS — M79675 Pain in left toe(s): Secondary | ICD-10-CM | POA: Diagnosis not present

## 2022-01-31 DIAGNOSIS — M79674 Pain in right toe(s): Secondary | ICD-10-CM

## 2022-01-31 NOTE — Telephone Encounter (Signed)
LM with wife to rtn my call at [phone] to schedule AWV with [facility]

## 2022-01-31 NOTE — Progress Notes (Signed)
This patient returns to my office for at risk foot care.  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 This patient is unable to cut nails herself since the patient cannot reach her  nails.These nails are painful walking and wearing shoes. She has hip resection 3-4 weeks ago. This patient presents for at risk foot care today.  General Appearance  Alert, conversant and in no acute stress.  Vascular  Dorsalis pedis and posterior tibial  pulses are palpable  bilaterally.  Capillary return is within normal limits  bilaterally. Temperature is within normal limits  bilaterally.  Neurologic  Senn-Weinstein monofilament wire test within normal limits  bilaterally. Muscle power within normal limits bilaterally.  Nails Thick disfigured discolored nails with subungual debris  from second to fifth toes bilaterally. No evidence of bacterial infection or drainage bilaterally.  Orthopedic  No limitations of motion  feet .  No crepitus or effusions noted.  No bony pathology or digital deformities noted.  Skin  normotropic skin with no porokeratosis noted bilaterally.  No signs of infections or ulcers noted.     Onychomycosis  Pain in right toes  Pain in left toes  Consent was obtained for treatment procedures.   Mechanical debridement of nails 1-5  bilaterally performed with a nail nipper.  Filed with dremel without incident.    Return office visit  3 months                     Told patient to return for periodic foot care and evaluation due to potential at risk complications.   Gardiner Barefoot DPM

## 2022-02-05 ENCOUNTER — Telehealth: Payer: Self-pay | Admitting: Internal Medicine

## 2022-02-11 ENCOUNTER — Other Ambulatory Visit: Payer: Self-pay | Admitting: Internal Medicine

## 2022-02-19 ENCOUNTER — Ambulatory Visit (INDEPENDENT_AMBULATORY_CARE_PROVIDER_SITE_OTHER): Payer: HMO | Admitting: Podiatry

## 2022-02-19 ENCOUNTER — Encounter: Payer: Self-pay | Admitting: Podiatry

## 2022-02-19 DIAGNOSIS — M76821 Posterior tibial tendinitis, right leg: Secondary | ICD-10-CM

## 2022-02-19 NOTE — Progress Notes (Signed)
Subjective:   Patient ID: [name], female   DOB: 70 y.o.   MRN: [id number]   HPI Patient presents with a lot of pain on the inside of the right ankle does not remember specific injury neuro   ROS      Objective:  Physical Exam  Vascular status intact with patient's right medial ankle sore around the posterior tibial tendon as it comes underneath the medial malleolus inserts into the navicular     Assessment:  Posterior tibial tendinitis right near its insertion      Plan:  H&P explained condition went ahead today explained injection and risk and she wants injection I did sterile prep and injected just the sheath 3 mg Kenalog 5 mg Xylocaine tolerated well wear supportive shoes and reduced activity and if any symptoms were to continue she will be seen back

## 2022-02-20 ENCOUNTER — Other Ambulatory Visit: Payer: Self-pay | Admitting: Internal Medicine

## 2022-02-21 ENCOUNTER — Other Ambulatory Visit: Payer: Self-pay | Admitting: Physician Assistant

## 2022-02-21 ENCOUNTER — Ambulatory Visit
Admission: RE | Admit: 2022-02-21 | Discharge: 2022-02-21 | Disposition: A | Discharge status: Home / Self Care | Payer: HMO | Source: Ambulatory Visit | Attending: Physician Assistant | Admitting: Physician Assistant

## 2022-02-21 DIAGNOSIS — K5900 Constipation, unspecified: Secondary | ICD-10-CM | POA: Diagnosis not present

## 2022-02-21 DIAGNOSIS — R109 Unspecified abdominal pain: Secondary | ICD-10-CM

## 2022-02-22 ENCOUNTER — Encounter: Payer: Self-pay | Admitting: Cardiovascular Disease

## 2022-02-22 ENCOUNTER — Ambulatory Visit (INDEPENDENT_AMBULATORY_CARE_PROVIDER_SITE_OTHER): Payer: HMO | Admitting: Cardiovascular Disease

## 2022-02-22 ENCOUNTER — Ambulatory Visit: Payer: HMO | Admitting: Cardiovascular Disease

## 2022-02-22 VITALS — BP 132/50 | HR 87 | Ht 62.0 in | Wt 185.4 lb

## 2022-02-22 DIAGNOSIS — E1169 Type 2 diabetes mellitus with other specified complication: Secondary | ICD-10-CM | POA: Diagnosis not present

## 2022-02-22 DIAGNOSIS — D6869 Other thrombophilia: Secondary | ICD-10-CM | POA: Diagnosis not present

## 2022-02-22 DIAGNOSIS — I5032 Chronic diastolic (congestive) heart failure: Secondary | ICD-10-CM

## 2022-02-22 DIAGNOSIS — I700 Atherosclerosis of aorta: Secondary | ICD-10-CM

## 2022-02-22 DIAGNOSIS — I10 Essential (primary) hypertension: Secondary | ICD-10-CM

## 2022-02-22 DIAGNOSIS — E669 Obesity, unspecified: Secondary | ICD-10-CM | POA: Diagnosis not present

## 2022-02-22 DIAGNOSIS — E785 Hyperlipidemia, unspecified: Secondary | ICD-10-CM

## 2022-02-22 DIAGNOSIS — I2510 Atherosclerotic heart disease of native coronary artery without angina pectoris: Secondary | ICD-10-CM | POA: Diagnosis not present

## 2022-02-22 DIAGNOSIS — I4811 Longstanding persistent atrial fibrillation: Secondary | ICD-10-CM

## 2022-02-22 NOTE — Patient Instructions (Signed)
Medication Instructions:   PLEASE TAKE TORSEMIDE '20mg'$  ONCE DAILY   PLEASE CALL WITH  YOUR WEIGHTS IN 10 DAYS OR SEND VIA MYCHART  *If you need a refill on your cardiac medications before your next appointment, please call your pharmacy*  Lab Work: BLOOD WORK TODAY  If you have labs (blood work) drawn today and your tests are completely normal, you will receive your results only by: [place] (if you have MyChart) OR A paper copy in the mail If you have any lab test that is abnormal or we need to change your treatment, we will call you to review the results.  Follow-Up: At [facility], you and your health needs are our priority.  As part of our continuing mission to provide you with exceptional heart care, we have created designated Provider Care Teams.  These Care Teams include your primary Cardiologist (physician) and Advanced Practice Providers (APPs -  Physician Assistants and Nurse Practitioners) who all work together to provide you with the care you need, when you need it.  Your next appointment:   3 month(s)  The format for your next appointment:   In Person  Provider:   Sanda Klein, MD

## 2022-02-22 NOTE — Progress Notes (Signed)
Cardiology Office Note:    Date:  02/22/2022   ID:  [name], DOB 1950/10/15, MRN [id number]  PCP:  Hoyt Koch, MD  Cardiologist:  Ryllie Nieland  (CHF clinic, [place]) Electrophysiologist:  None   Referring MD: Hoyt Koch, *   Chief Complaint  Patient presents with   Congestive Heart Failure     History of Present Illness:    [name] is a 71 y.o. female with a hx of CAD (posterior lateral ventricular branch stent in 2009), longstanding persistent atrial fibrillation, 3, diabetes mellitus type 2, hypertension, pericardiocentesis for hemothorax with tamponade in June 2020, while on treatment with oral anticoagulant. She is a retired Electrical engineer.  She underwent total left hip replacement in February (had left acetabular fracture treated conservatively after a fall in July 2022) and never really bounced back as quickly as she hoped she would.  She had problems with swelling and weight gain immediately after surgery, especially complaining of a distended stomach.  This improved with diuretics to some degree.  She has returned to work as a Electrical engineer during both onsite and telemedicine visits.  She has difficulty climbing stairs and especially becomes short of breath when she walks outside.  Seems to describe NYHA functional class II occasionally class III.  She is sleeping in a recliner sitting halfway up.  She has occasional chest tightness with exertion, but this is inconsistent.    At her last appointment we reassessed her "dry weight" to be around 170 pounds.  On 01/19/2022 she weighed 177 pounds.  Today she weighs 185 pounds.  She has not been taking her diuretic on a daily basis, since this makes it hard for her to work.  She denies palpitations dizziness or syncope.  Presenting rhythm today is atrial fibrillation with controlled ventricular response at 87 bpm.  Roughly a year ago she was hospitalized for lower GI bleeding with bright blood  blood per rectum and a hemoglobin as low as 6.  Work-up with colonoscopy, upper endoscopy and eventually capsule enteroscopy did not really show [place] clear source of bleeding, but there were a couple of AVMs in the colon, 1 in the cecum that appeared to be friable.  This was treated with electrocautery.  She has been on anticoagulation since then without overt bleeding problems.  Glycemic control has been fair based on the most recent hemoglobin A1c.  She has an excellent Lipid profile.  She had a nuclear stress test in June 12, 2021 low risk findings without evidence of ischemia or previous infarction.  Her last echocardiogram in April 2021 showed LVEF of 50-55%.  This was a decline from June 2020 when the EF was 60%.  The most recent echocardiogram performed in April 2021 showed LVEF of 50-55%.  Both atria are moderately dilated.  There were no serious valvular abnormalities.  She had a normal perfusion pattern on her nuclear stress tests from 2012, 2014, 2016 and October 2022.  Past Medical History:  Diagnosis Date   Anemia, unspecified    Arthritis    Asthma    Atrial fibrillation with RVR ([place])    a. on Xarelto   Bell's palsy    Facial nerve decompression in 2001   CHF (congestive heart failure) (HCC)    Chronic low back pain    COPD with asthma ([place])    Coronary artery disease    Myoview 04/12/11 was entirely normal. ECHO 02/26/08 showed only minor abnormalities. Stenting 05/26/08 of her posterolateral branch  to the left circumflex coronary artery. Used a 2.5x48m Taxus Monorail stent.myoview 2014 was without ischemia   Diabetes mellitus    Type 2   Early cataracts, bilateral    Fatty liver    GERD (gastroesophageal reflux disease)    Glaucoma    Glaucoma    Goiter    Heart murmur    History of nuclear stress test 2012; 2014   lexiscan; normal pattern of perfusion; normal, low risk scan    Hyperlipidemia    Hypertension    Panic disorder    Pneumonia 2008   Polycystic ovary  disease    Hysterectomy in 1982 for this   Shortness of breath dyspnea    ECHO 02/26/08 showed only minor abnormalities   Spinal stenosis     Past Surgical History:  Procedure Laterality Date   ABDOMINAL HYSTERECTOMY  1982   & BSO; for polycystic ovary disease   CARDIOVERSION N/A 12/17/2013   Procedure: CARDIOVERSION;  Surgeon: KPixie Casino MD;  [facility]  Service: Cardiovascular;  Laterality: N/A;   CARDIOVERSION N/A 09/30/2015   Procedure: CARDIOVERSION;  Surgeon: KDorothy Spark MD;  [facility]: [facility]  Service: Cardiovascular;  Laterality: N/A;   CARDIOVERSION N/A 06/22/2016   Procedure: CARDIOVERSION;  Surgeon: TSkeet Latch MD;  [facility]  Service: Cardiovascular;  Laterality: N/A;   CENTRAL LINE INSERTION  01/13/2019   Procedure: CENTRAL LINE INSERTION;  Surgeon: BJolaine Artist MD;  [facility];  Service: Cardiovascular;;   COLONOSCOPY     last 2009; Dr BCristina Gong due 2019   COLONOSCOPY WITH PROPOFOL N/A 01/23/2018   Procedure: COLONOSCOPY WITH PROPOFOL;  Surgeon: BRonald Lobo MD;  [facility]  Service: Endoscopy;  Laterality: N/A;   COLONOSCOPY WITH PROPOFOL N/A 11/04/2020   Procedure: COLONOSCOPY WITH PROPOFOL;  Surgeon: BRonald Lobo MD;  [facility]  Service: Endoscopy;  Laterality: N/A;   CORONARY ANGIOPLASTY  05/26/2008   Stenting of her posterolateral branch to the left circumflex coronary artery. Used a 2.5x178mTaxus Monorail stent.   ESOPHAGOGASTRODUODENOSCOPY (EGD) WITH PROPOFOL N/A 01/23/2018   Procedure: ESOPHAGOGASTRODUODENOSCOPY (EGD) WITH PROPOFOL;  Surgeon: BuRonald LoboMD;  Location: [facility] Service: Endoscopy;  Laterality: N/A;   ESOPHAGOGASTRODUODENOSCOPY (EGD) WITH PROPOFOL N/A 11/04/2020   Procedure: ESOPHAGOGASTRODUODENOSCOPY (EGD) WITH PROPOFOL;  Surgeon: BuRonald LoboMD;  [facility]: [facility] Service: Endoscopy;  Laterality: N/A;   FACIAL NERVE  DECOMPRESSION  2001/2002   bells palsy    GIVENS CAPSULE STUDY N/A 11/04/2020   Procedure: GIVENS CAPSULE STUDY;  Surgeon: BuRonald LoboMD;  [facility] Service: Endoscopy;  Laterality: N/A;   HOT HEMOSTASIS N/A 11/04/2020   Procedure: HOT HEMOSTASIS (ARGON PLASMA COAGULATION/BICAP);  Surgeon: BuRonald LoboMD;  [facility];  Service: Endoscopy;  Laterality: N/A;   LAPAROSCOPIC CHOLECYSTECTOMY  06/15/2011    Dr InDalbert Batman PERICARDIOCENTESIS N/A 01/13/2019   Procedure: PERICARDIOCENTESIS;  Surgeon: McBurnell BlanksMD;  [facility];  Service: Cardiovascular;  Laterality: N/A;   POLYPECTOMY  11/04/2020   Procedure: POLYPECTOMY;  Surgeon: BuRonald LoboMD;  [facility]: [facility] Service: Endoscopy;;   RIGHT AND LEFT HEART CATH N/A 01/13/2019   Procedure: RIGHT AND LEFT HEART CATH;  Surgeon: BeJolaine ArtistMD;  [facility]: [facility];  Service: Cardiovascular;  Laterality: N/A;   TEE WITHOUT CARDIOVERSION N/A 12/17/2013   Procedure: TRANSESOPHAGEAL ECHOCARDIOGRAM (TEE);  Surgeon: KePixie CasinoMD;  [facility];  Service: Cardiovascular;  Laterality: N/A;  trish/ja   TOTAL HIP ARTHROPLASTY Left 09/25/2021   Procedure: LEFT TOTAL HIP ARTHROPLASTY ANTERIOR APPROACH;  Surgeon: Frederik Pear, MD;  Location: [facility];  Service: Orthopedics;  Laterality: Left;   TRANSTHORACIC ECHOCARDIOGRAM  1/82/9937   LV systolic function normal with mild conc LVH; LA mildly dilated; trace MR/TR   UPPER GI ENDOSCOPY  2009   negative    Current Medications: No outpatient medications have been marked as taking for the 02/22/22 encounter (Appointment) with Raul Winterhalter, Dani Gobble, MD.     Allergies:   Benadryl [diphenhydramine hcl], Clopidogrel bisulfate, Ioversol, Benadryl [diphenhydramine], and Iodinated contrast media   Social History   Socioeconomic History   Marital status: Married    Spouse name: Not on file   Number of children: 2   Years  of education: master's   Highest education level: Not on file  Occupational History   Occupation: Electrical engineer    Employer: [employer]  Tobacco Use   Smoking status: Former    Packs/day: 1.50    Years: 30.00    Total pack years: 45.00    Types: Cigarettes    Quit date: 08/13/1993    Years since quitting: 28.5   Smokeless tobacco: Never  Vaping Use   Vaping Use: Never used  Substance and Sexual Activity   Alcohol use: No   Drug use: No   Sexual activity: Not on file  Other Topics Concern   Not on file  Social History Narrative   ** Merged History Encounter **       Social Determinants of Health   Financial Resource Strain: Not on file  Food Insecurity: Not on file  Transportation Needs: Not on file  Physical Activity: Not on file  Stress: Not on file  Social Connections: Not on file     Family History: The patient's family history includes Cancer in her brother and sister; Colon cancer (age of onset: 92) in her brother; Colon polyps in her sister; Emphysema (age of onset: 25) in her mother; Gout in her brother; Heart attack (age of onset: 2) in her father; Hyperlipidemia in her brother; Hypertension in her brother; Pneumonia in her maternal grandmother; Ulcers in her mother. There is no history of Diabetes or Stroke.  ROS:   Please see the history of present illness.    All other systems are reviewed and are negative.   EKGs/Labs/Other Studies Reviewed:    The following studies were reviewed today: Echocardiogram December 09, 2019  1. Abnormal septal motion and hypokinesis as well as inferior basal  hypokinesis EF similar to echo done 05/2019 or slightly better . Left  ventricular ejection fraction, by estimation, is 50 to 55%. The left  ventricle has low normal function. The left  ventricle demonstrates regional wall motion abnormalities (see scoring  diagram/findings for description). Left ventricular diastolic parameters  are indeterminate.   2.  Right ventricular systolic function is normal. The right ventricular  size is normal.   3. Left atrial size was moderately dilated.   4. Right atrial size was moderately dilated.   5. The mitral valve is degenerative. Trivial mitral valve regurgitation.  No evidence of mitral stenosis.   6. The aortic valve is tricuspid. Aortic valve regurgitation is trivial.  Mild to moderate aortic valve sclerosis/calcification is present, without  any evidence of aortic stenosis.   7. The inferior vena cava is normal in size with greater than 50%  respiratory variability, suggesting right atrial pressure  of 3 mmHg.   Nuclear stress test 06/12/2021   Findings are consistent with no prior ischemia and no prior myocardial infarction. The study is low risk.   No ST deviation was noted.   LV perfusion is normal. There is no evidence of ischemia. There is no evidence of infarction.   End diastolic cavity size is normal. End systolic cavity size is normal.   Normal perfusion EF may not be accurate due to afib estimated at 51% no RWMAls noted     EKG:  EKG is ordered today and shows atrial fibrillation with controlled ventricular response at 87 bpm.  Left axis deviation is less prominent, not meeting criteria for left anterior fascicular block at this time  Recent Labs:  02/28/2021 hemoglobin A1c 7.1% 02/28/2021: ALT 16 03/01/2021: Magnesium 1.7; TSH 1.920 10/18/2021: BUN 21; Creatinine, Ser 1.16; Hemoglobin 10.6; Platelets 238.0; Potassium 4.4; Pro B Natriuretic peptide (BNP) 108.0; Sodium 132   11/14/2020  Creatinine 1.33, potassium 4.3, hemoglobin 10.3 Recent Lipid Panel    Component Value Date/Time   CHOL 104 09/23/2020 1601   CHOL 188 11/17/2019 1534   TRIG 150.0 (H) 09/23/2020 1601   HDL 38.10 (L) 09/23/2020 1601   HDL 43 11/17/2019 1534   CHOLHDL 3 09/23/2020 1601   VLDL 30.0 09/23/2020 1601   LDLCALC 36 09/23/2020 1601   LDLCALC 108 (H) 11/17/2019 1534   LDLDIRECT 143.4 01/23/2008 1316     Physical Exam:    VS:  BP (!) 132/50 (BP Location: Left Arm, Patient Position: Sitting, Cuff Size: Large)   Pulse 87   Ht '5\' 2"'$  (1.575 m)   Wt 185 lb 6.4 oz (84.1 kg)   SpO2 94%   BMI 33.91 kg/m     Wt Readings from Last 3 Encounters:  01/19/22 177 lb (80.3 kg)  12/25/21 176 lb (79.8 kg)  11/27/21 173 lb 8 oz (78.7 kg)     General: Alert, oriented x3, no distress, borderline obese Head: no evidence of trauma, PERRL, EOMI, no exophtalmos or lid lag, no myxedema, no xanthelasma; normal ears, nose and oropharynx Neck: 6-8 cm elevation in jugular venous pulsations and no hepatojugular reflux; brisk carotid pulses without delay and no carotid bruits Chest: clear to auscultation, no signs of consolidation by percussion or palpation, normal fremitus, symmetrical and full respiratory excursions Cardiovascular: normal position and quality of the apical impulse, irregular rhythm, normal first and second heart sounds, no murmurs, rubs or gallops Abdomen: no tenderness, does have mild distention, no obvious ascites, no masses by palpation, no abnormal pulsatility or arterial bruits, normal bowel sounds, no hepatosplenomegaly Extremities: no clubbing, cyanosis or edema; 2+ radial, ulnar and brachial pulses bilaterally; 2+ right femoral, posterior tibial and dorsalis pedis pulses; 2+ left femoral, posterior tibial and dorsalis pedis pulses; no subclavian or femoral bruits Neurological: grossly nonfocal Psych: Normal mood and affect  ASSESSMENT:    1. Longstanding persistent atrial fibrillation ([place])   2. Chronic diastolic CHF (congestive heart failure) ([place])   3. Diabetes mellitus type 2 in obese ([place])   4. Acquired thrombophilia ([place])   5. Coronary artery disease involving native coronary artery of native heart without angina pectoris   6. Essential hypertension   7. Hyperlipidemia associated with type 2 diabetes mellitus ([place])   8. Type 2 diabetes mellitus with other specified  complication, without long-term current use of insulin ([place])   9. Aortic atherosclerosis (HCC)      PLAN:    In order of problems listed above:  AFib:  Well rate controlled and asymptomatic.  On appropriate anticoagulation with improving hemoglobin now that she is receiving iron supplements.  Good rate control is important in view of previous history of tachycardia cardiomyopathy.   CHA2DS2-VASc 6 (age, gender, hypertension, diabetes, CAD, history of heart failure/LV dysfunction).  We have previously discussed alternatives to anticoagulation.  I believe she is an excellent candidate for a Watchman device.  We talked about the purpose of device, implantation procedure follow-up anticoagulation protocol and possible complications.  She appears skeptical but is willing to research the topic. CHF: She appears to be hypervolemic, maybe as much as 15 pounds above "dry weight" that is probably around 170 pounds; has gained 8 pounds just in a couple of weeks.  This may be due to the fact that she is not taking her torsemide on a routine basis, but is skipping it a lot.  Asked her to start taking it every day and continue avoiding sodium rich foods.  Send [place] a log of her weights in a week or 2.  She is on SGLT2 inhibitor, albeit not 1 that is approved for CHF.  On beta-blockers and ACE inhibitor.  Preserved LVEF.  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  Anticoagulation: Currently well-tolerated without bleeding problems.  She has a history of hemopericardium, previous iron deficiency anemia due to lower GI bleeding, recurrent anemia following pelvic fracture.  I think Watchman would be a good idea. CAD: Asymptomatic.  Low risk recent nuclear stress test.  Near normal LVEF. HTN: Very well controlled. HLP: Excellent LDL on statin. DM: Hemoglobin A1c today. Periodic limb movement syndrome: Saw Dr. Brett Fairy for sleep clinic evaluation in March 2021.  On Requip.  COPD: Saw Dr. Annamaria Boots in January 2022.   FEV1 not too bad at 1.66 L / 82% of predicted. Aortic atherosclerosis: Aortic calcification with normal caliber aorta seen on chest x-ray and chest CT. continue statin therapy.  Medication Adjustments/Labs and Tests Ordered: Current medicines are reviewed at length with the patient today.  Concerns regarding medicines are outlined above.  No orders of the defined types were placed in this encounter.   No orders of the defined types were placed in this encounter.    Patient Instructions  Medication Instructions:   PLEASE TAKE TORSEMIDE '20mg'$  ONCE DAILY   PLEASE CALL WITH  YOUR WEIGHTS IN 10 DAYS OR SEND VIA MYCHART  *If you need a refill on your cardiac medications before your next appointment, please call your pharmacy*  Lab Work: BLOOD WORK TODAY  If you have labs (blood work) drawn today and your tests are completely normal, you will receive your results only by: [place] (if you have MyChart) OR A paper copy in the mail If you have any lab test that is abnormal or we need to change your treatment, we will call you to review the results.  Follow-Up: At [facility], you and your health needs are our priority.  As part of our continuing mission to provide you with exceptional heart care, we have created designated Provider Care Teams.  These Care Teams include your primary Cardiologist (physician) and Advanced Practice Providers (APPs -  Physician Assistants and Nurse Practitioners) who all work together to provide you with the care you need, when you need it.  Your next appointment:   3 month(s)  The format for your next appointment:   In Person  Provider:   Sanda Klein, MD       Signed, Sanda Klein, MD  02/22/2022 3:39  PM    [facility]

## 2022-02-23 ENCOUNTER — Other Ambulatory Visit: Payer: Self-pay | Admitting: *Deleted

## 2022-02-23 ENCOUNTER — Encounter (HOSPITAL_COMMUNITY): Payer: Self-pay

## 2022-02-23 ENCOUNTER — Telehealth: Payer: Self-pay | Admitting: Cardiology

## 2022-02-23 ENCOUNTER — Encounter: Payer: Self-pay | Admitting: Cardiovascular Disease

## 2022-02-23 ENCOUNTER — Telehealth (HOSPITAL_COMMUNITY): Payer: Self-pay

## 2022-02-23 ENCOUNTER — Telehealth: Payer: Self-pay | Admitting: *Deleted

## 2022-02-23 ENCOUNTER — Telehealth: Payer: Self-pay | Admitting: Cardiovascular Disease

## 2022-02-23 DIAGNOSIS — E875 Hyperkalemia: Secondary | ICD-10-CM

## 2022-02-23 LAB — BASIC METABOLIC PANEL
BUN/Creatinine Ratio: 20 (ref 12–28)
BUN/Creatinine Ratio: 23 (ref 12–28)
BUN: 23 mg/dL (ref 8–27)
BUN: 25 mg/dL (ref 8–27)
CO2: 20 mmol/L (ref 20–29)
CO2: 24 mmol/L (ref 20–29)
Calcium: 9.3 mg/dL (ref 8.7–10.3)
Calcium: 9.2 mg/dL (ref 8.7–10.3)
Chloride: 100 mmol/L (ref 96–106)
Chloride: 101 mmol/L (ref 96–106)
Creatinine, Ser: 1.14 mg/dL — ABNORMAL HIGH (ref 0.57–1.00)
Creatinine, Ser: 1.1 mg/dL — ABNORMAL HIGH (ref 0.57–1.00)
Glucose: 192 mg/dL — ABNORMAL HIGH (ref 70–99)
Glucose: 210 mg/dL — ABNORMAL HIGH (ref 70–99)
Potassium: 6.3 mmol/L (ref 3.5–5.2)
Potassium: 5.7 mmol/L — ABNORMAL HIGH (ref 3.5–5.2)
Sodium: 134 mmol/L (ref 134–144)
Sodium: 134 mmol/L (ref 134–144)
eGFR: 51 mL/min/{1.73_m2} — ABNORMAL LOW (ref >59)
eGFR: 53 mL/min/{1.73_m2} — ABNORMAL LOW (ref >59)

## 2022-02-23 LAB — HEMOGLOBIN A1C
Est. average glucose Bld gHb Est-mCnc: 180 mg/dL
Hgb A1c MFr Bld: 7.9 % — ABNORMAL HIGH (ref 4.8–5.6)

## 2022-02-23 LAB — CBC
Hematocrit: 34.8 % (ref 34.0–46.6)
Hemoglobin: 11.4 g/dL (ref 11.1–15.9)
MCH: 28.3 pg (ref 26.6–33.0)
MCHC: 32.8 g/dL (ref 31.5–35.7)
MCV: 86 fL (ref 79–97)
Platelets: 190 10*3/uL (ref 150–450)
RBC: 4.03 x10E6/uL (ref 3.77–5.28)
RDW: 14.1 % (ref 11.7–15.4)
WBC: 7.2 10*3/uL (ref 3.4–10.8)

## 2022-02-23 LAB — BRAIN NATRIURETIC PEPTIDE: BNP: 137.7 pg/mL — ABNORMAL HIGH (ref 0.0–100.0)

## 2022-02-23 NOTE — Telephone Encounter (Signed)
-----   Message from Sanda Klein, MD sent at 02/23/2022  7:46 AM EDT ----- Potassium level is very high, but I am not sure whether this is real or an artifact.  Similar problem happened last October.  Unfortunately need to recheck to basic metabolic panel.  Kidney function is unchanged. Please hold the potassium supplement and lisinopril until we get the results of the repeat testing.  Taking the torsemide on a daily basis rather than sporadically will help bring the potassium down, if it is truly abnormal. Glucose control has deteriorated compared to 5 months ago.  Hemoglobin A1c is now up to 7.9%. Mild anemia has resolved.  Hemoglobin is 11.4. BNP (the test to assess degree of volume overload) is still pending.

## 2022-02-23 NOTE — Telephone Encounter (Signed)
Attempted to call patient in regards to Pulmonary Rehab - LM on VM Mailed letter 

## 2022-02-23 NOTE — Telephone Encounter (Signed)
Patient called and wanted to let Dr. Sallyanne Kuster and nurse know that she will be coming back today to repeat bloodwork

## 2022-02-23 NOTE — Telephone Encounter (Signed)
Patient made aware of results and verbalized understanding.  STAT labs ordered.

## 2022-02-23 NOTE — Telephone Encounter (Signed)
Cardiology On-call Contacted by [facility] re: critical lab value K 6.3 Pt has had issues w/ hyperkalemia in the past.  She is on K supplement. Attempted to reach pt by phone; no answer. Will have incoming team contact pt to instruct to hold K supplement today and take extra dose of diuretic this AM; await further instruction from ordering provider Dr. Sallyanne Kuster. Rudean Curt, MD , [facility] 5:58 AM

## 2022-02-26 ENCOUNTER — Ambulatory Visit: Payer: HMO | Admitting: Podiatry

## 2022-02-26 NOTE — Addendum Note (Signed)
Addended by: Ricci Barker on: 02/26/2022 03:36 PM   Modules accepted: Orders

## 2022-03-01 ENCOUNTER — Encounter: Payer: Self-pay | Admitting: Cardiovascular Disease

## 2022-03-01 DIAGNOSIS — E875 Hyperkalemia: Secondary | ICD-10-CM

## 2022-03-01 DIAGNOSIS — I5032 Chronic diastolic (congestive) heart failure: Secondary | ICD-10-CM

## 2022-03-02 ENCOUNTER — Other Ambulatory Visit: Payer: Self-pay

## 2022-03-02 DIAGNOSIS — E875 Hyperkalemia: Secondary | ICD-10-CM | POA: Diagnosis not present

## 2022-03-03 LAB — BASIC METABOLIC PANEL
BUN/Creatinine Ratio: 19 (ref 12–28)
BUN: 22 mg/dL (ref 8–27)
CO2: 24 mmol/L (ref 20–29)
Calcium: 8.7 mg/dL (ref 8.7–10.3)
Chloride: 92 mmol/L — ABNORMAL LOW (ref 96–106)
Creatinine, Ser: 1.14 mg/dL — ABNORMAL HIGH (ref 0.57–1.00)
Glucose: 172 mg/dL — ABNORMAL HIGH (ref 70–99)
Potassium: 4.3 mmol/L (ref 3.5–5.2)
Sodium: 131 mmol/L — ABNORMAL LOW (ref 134–144)
eGFR: 51 mL/min/{1.73_m2} — ABNORMAL LOW (ref >59)

## 2022-03-05 NOTE — Addendum Note (Signed)
Addended by: Ricci Barker on: 03/05/2022 01:59 PM   Modules accepted: Orders

## 2022-03-07 NOTE — Telephone Encounter (Signed)
Please have her start KCl 10 mEq daily and check B<MET in 3-4 weeks. Thanks

## 2022-03-08 ENCOUNTER — Telehealth: Payer: Self-pay | Admitting: Internal Medicine

## 2022-03-08 MED ORDER — POTASSIUM CHLORIDE ER 10 MEQ PO CPCR: 10.0000 meq | ORAL_CAPSULE | Freq: Every day | ORAL | 1 refills | Status: DC

## 2022-03-08 NOTE — Addendum Note (Signed)
Addended by: Ricci Barker on: 03/08/2022 03:12 PM   Modules accepted: Orders

## 2022-03-08 NOTE — Telephone Encounter (Signed)
[place] and [place] her medication at home. She needs:  JANUMET 50-1000 MG tablet  metoprolol succinate (TOPROL-XL) 100 MG 24 hr tablet  Can we please send these RX's to [id number] ----  [place], [place] [address]  Phone: [phone]

## 2022-03-09 ENCOUNTER — Other Ambulatory Visit: Payer: Self-pay | Admitting: Internal Medicine

## 2022-03-09 MED ORDER — METOPROLOL SUCCINATE ER 100 MG PO TB24: 100.0000 mg | ORAL_TABLET | Freq: Two times a day (BID) | ORAL | 0 refills | Status: DC

## 2022-03-09 MED ORDER — JANUMET 50-1000 MG PO TABS: 1.0000 | ORAL_TABLET | Freq: Two times a day (BID) | ORAL | 0 refills | Status: DC

## 2022-03-09 NOTE — Telephone Encounter (Signed)
Refills have been sent to [facility] in [place]

## 2022-03-15 ENCOUNTER — Ambulatory Visit: Payer: HMO | Admitting: Physician Assistant

## 2022-03-15 ENCOUNTER — Other Ambulatory Visit: Payer: Self-pay | Admitting: Cardiovascular Disease

## 2022-03-19 ENCOUNTER — Telehealth: Payer: Self-pay | Admitting: Internal Medicine

## 2022-03-19 DIAGNOSIS — E119 Type 2 diabetes mellitus without complications: Secondary | ICD-10-CM | POA: Diagnosis not present

## 2022-03-19 DIAGNOSIS — J449 Chronic obstructive pulmonary disease, unspecified: Secondary | ICD-10-CM | POA: Diagnosis not present

## 2022-03-19 DIAGNOSIS — Z7984 Long term (current) use of oral hypoglycemic drugs: Secondary | ICD-10-CM | POA: Diagnosis not present

## 2022-03-19 DIAGNOSIS — F339 Major depressive disorder, recurrent, unspecified: Secondary | ICD-10-CM | POA: Diagnosis not present

## 2022-03-19 NOTE — Telephone Encounter (Signed)
Pt is requesting a refill on LORazepam (ATIVAN) 0.5 MG tablet.   Pt is also requesting HYDROcodone-acetaminophen (NORCO/VICODIN) 5-325 MG tablet. Advised pt rx has been discontinued. Pt is requesting med be added back to her rx list.    Please advise.

## 2022-03-20 MED ORDER — LORAZEPAM 0.5 MG PO TABS: ORAL_TABLET | ORAL | 2 refills | Status: DC

## 2022-03-20 NOTE — Telephone Encounter (Signed)
I renewed lorazepam.  The patient needs to schedule an office visit with Dr. Sharlet Salina to discuss her pain management.  Thanks

## 2022-03-20 NOTE — Telephone Encounter (Signed)
Called pt. LVM asking her to give our office a call back to schedule her a office visit to discuss her pain management

## 2022-03-26 DIAGNOSIS — K5900 Constipation, unspecified: Secondary | ICD-10-CM | POA: Diagnosis not present

## 2022-03-26 DIAGNOSIS — E871 Hypo-osmolality and hyponatremia: Secondary | ICD-10-CM | POA: Diagnosis not present

## 2022-03-26 DIAGNOSIS — D649 Anemia, unspecified: Secondary | ICD-10-CM | POA: Diagnosis not present

## 2022-03-26 DIAGNOSIS — K2970 Gastritis, unspecified, without bleeding: Secondary | ICD-10-CM | POA: Diagnosis not present

## 2022-03-26 DIAGNOSIS — L259 Unspecified contact dermatitis, unspecified cause: Secondary | ICD-10-CM | POA: Diagnosis not present

## 2022-03-26 DIAGNOSIS — R1010 Upper abdominal pain, unspecified: Secondary | ICD-10-CM | POA: Diagnosis not present

## 2022-03-29 ENCOUNTER — Ambulatory Visit
Admission: RE | Admit: 2022-03-29 | Discharge: 2022-03-29 | Disposition: A | Discharge status: Home / Self Care | Payer: HMO | Source: Ambulatory Visit | Attending: Physician Assistant | Admitting: Physician Assistant

## 2022-03-29 ENCOUNTER — Other Ambulatory Visit: Payer: Self-pay | Admitting: Physician Assistant

## 2022-03-29 DIAGNOSIS — K5900 Constipation, unspecified: Secondary | ICD-10-CM

## 2022-03-29 DIAGNOSIS — D649 Anemia, unspecified: Secondary | ICD-10-CM | POA: Diagnosis not present

## 2022-03-29 DIAGNOSIS — R1010 Upper abdominal pain, unspecified: Secondary | ICD-10-CM

## 2022-03-29 DIAGNOSIS — E871 Hypo-osmolality and hyponatremia: Secondary | ICD-10-CM | POA: Diagnosis not present

## 2022-03-29 IMAGING — RF DG HIP (WITH PELVIS) OPERATIVE*L*
1 series · 2 of 2 positions shown · non-contrast
Comparison: None.

CLINICAL DATA: Left hip replacement

EXAM:
OPERATIVE LEFT HIP (WITH PELVIS IF PERFORMED) 1 VIEWS
TECHNIQUE: Fluoroscopic spot image(s) were submitted for interpretation
post-operatively.
FLUOROSCOPY:
Fluoroscopy time: [DATE]
DAP: 0.3287 LycmV

[Series 1: unknown protocol · 0.20mm/px · 2 of 2 slices shown]
[im 1/2]
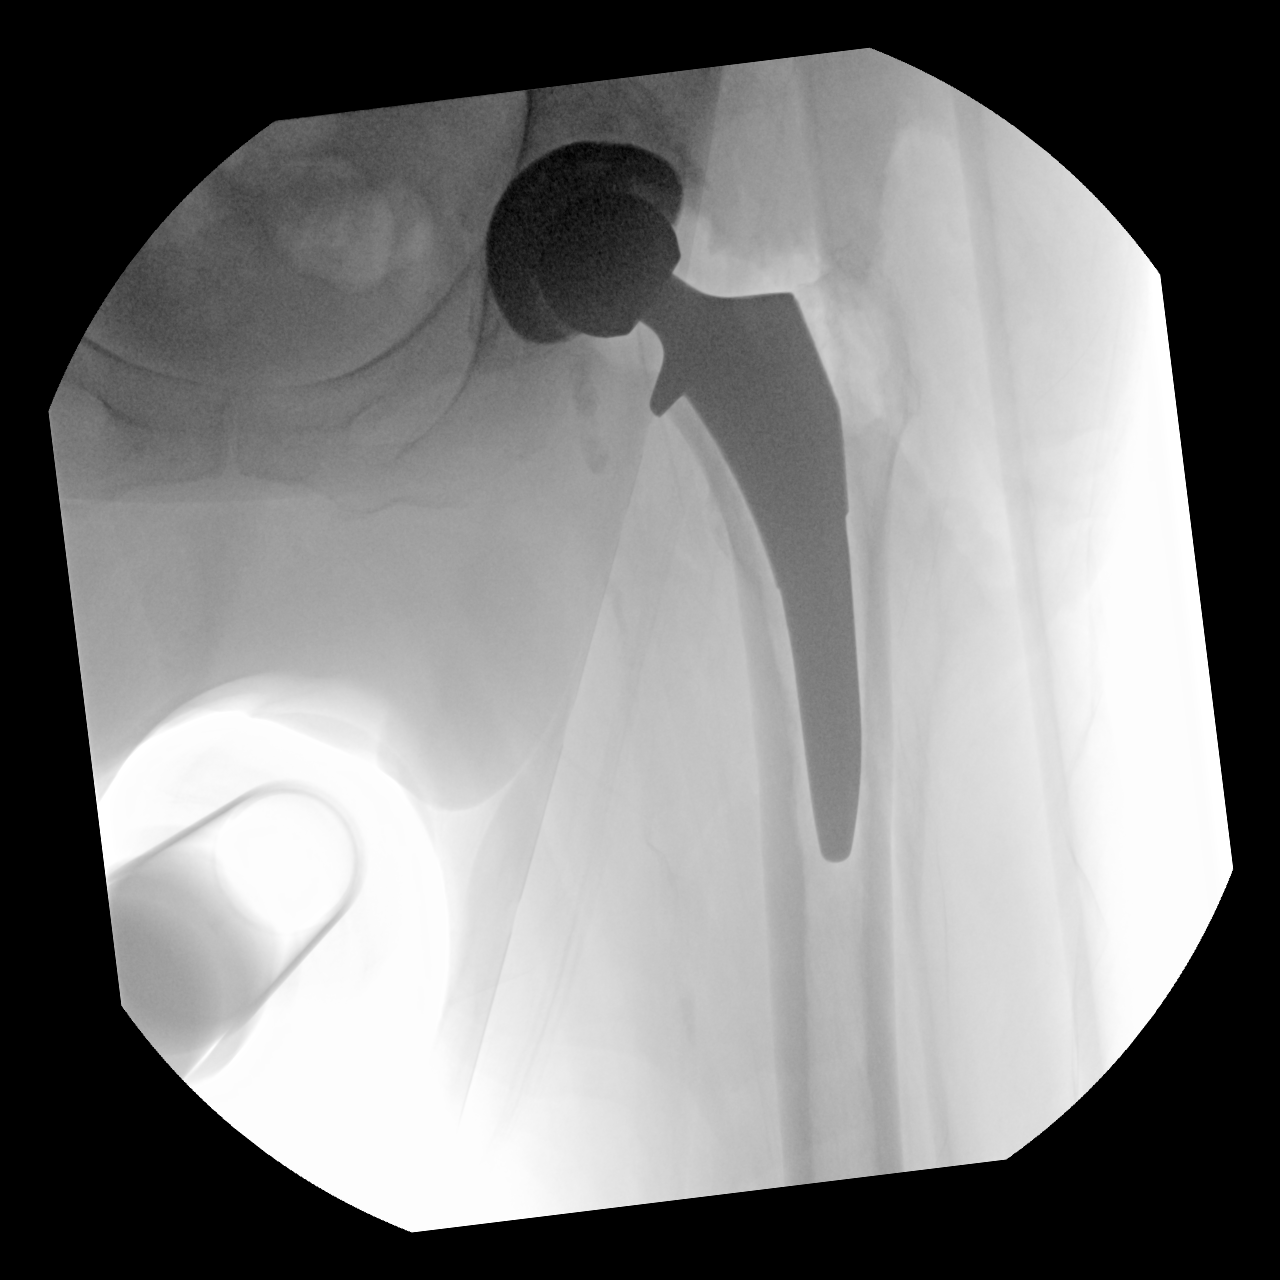
[im 2/2]
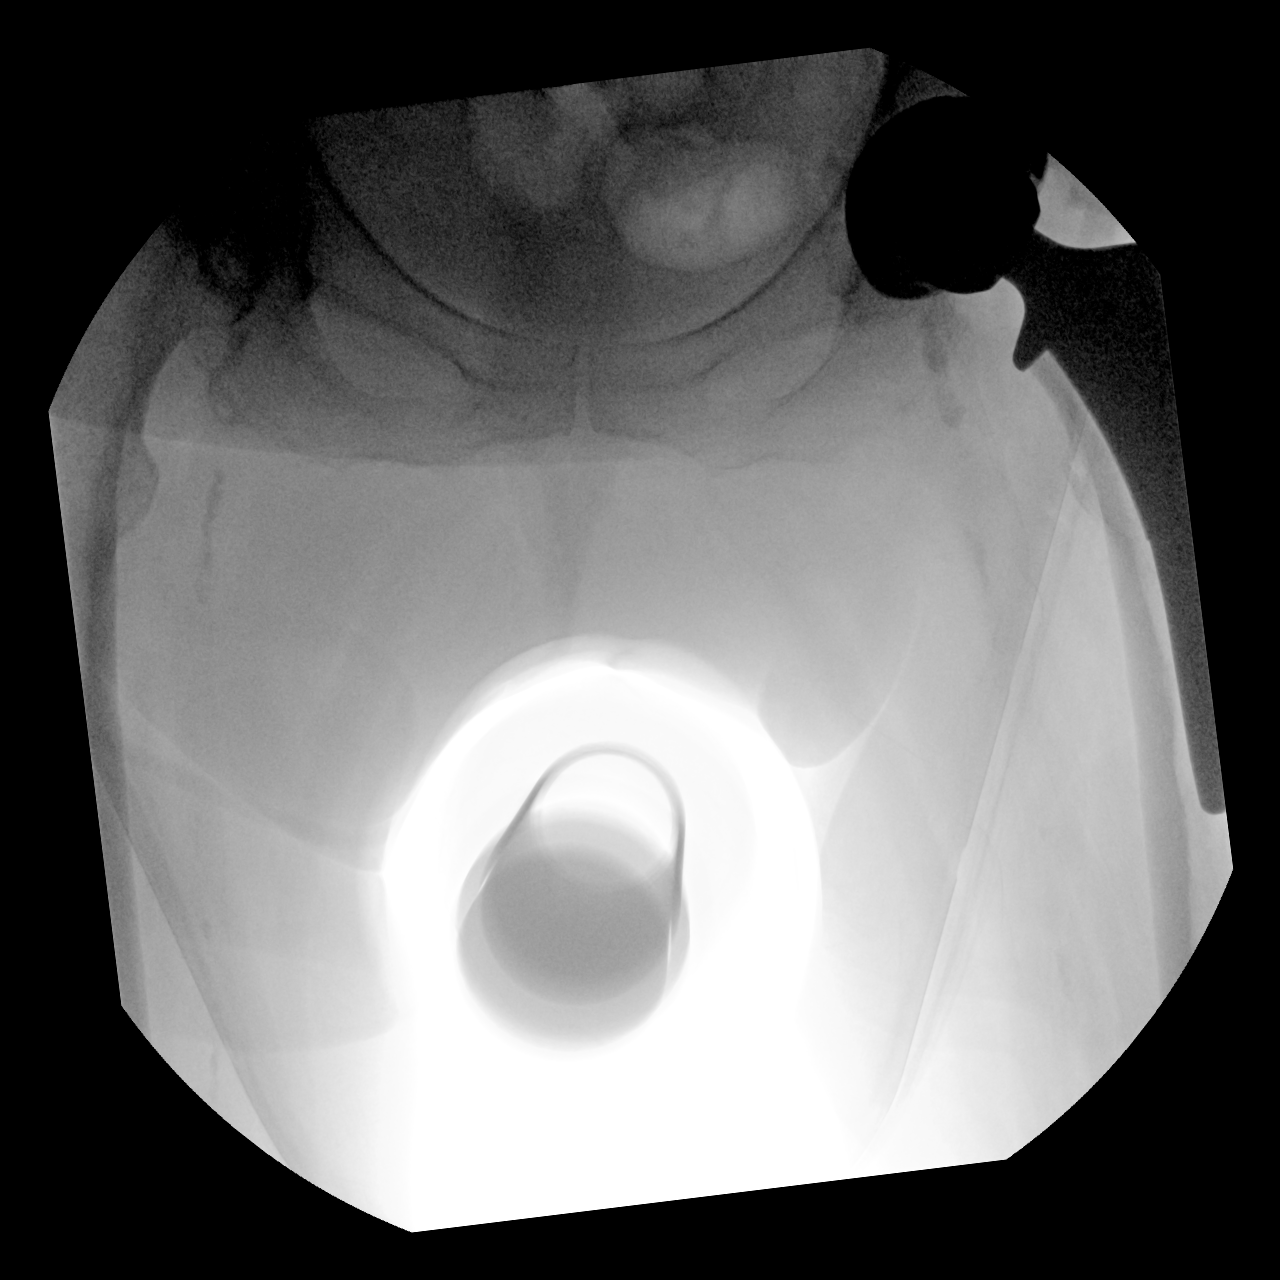

[2 of 2 positions shown; findings below may reference images not displayed]

FINDINGS: Intraoperative fluoroscopic images of the left hip demonstrate total
hip arthroplasty. No obvious perihardware fracture or component
malpositioning.
IMPRESSION: Intraoperative fluoroscopic images of the left hip demonstrate total
hip arthroplasty.

## 2022-03-31 ENCOUNTER — Other Ambulatory Visit: Payer: Self-pay | Admitting: Cardiovascular Disease

## 2022-04-03 ENCOUNTER — Encounter: Payer: Self-pay | Admitting: Internal Medicine

## 2022-04-03 ENCOUNTER — Ambulatory Visit (INDEPENDENT_AMBULATORY_CARE_PROVIDER_SITE_OTHER): Payer: HMO | Admitting: Internal Medicine

## 2022-04-03 DIAGNOSIS — F1120 Opioid dependence, uncomplicated: Secondary | ICD-10-CM | POA: Diagnosis not present

## 2022-04-03 DIAGNOSIS — M4800 Spinal stenosis, site unspecified: Secondary | ICD-10-CM | POA: Insufficient documentation

## 2022-04-03 DIAGNOSIS — E65 Localized adiposity: Secondary | ICD-10-CM | POA: Insufficient documentation

## 2022-04-03 MED ORDER — HYDROCODONE-ACETAMINOPHEN 5-325 MG PO TABS: 1.0000 | ORAL_TABLET | Freq: Two times a day (BID) | ORAL | 0 refills | Status: DC | PRN

## 2022-04-03 MED ORDER — TRIAMCINOLONE ACETONIDE 0.1 % EX CREA: 1.0000 | TOPICAL_CREAM | Freq: Two times a day (BID) | CUTANEOUS | 0 refills | Status: DC

## 2022-04-03 NOTE — Progress Notes (Signed)
   Subjective:   Patient ID: [name], female    DOB: July 13, 1951, 71 y.o.   MRN: [id number]  HPI The patient is a 71 YO female coming in for resume pain contract. We had paused this due to falls and need for increase with surgery that ortho was prescribing. We had previously been on hydrocodone 10/325 #60 per month and can resume this.  Review of Systems  Constitutional:  Positive for fatigue.  HENT: Negative.    Eyes: Negative.   Respiratory:  Negative for cough, chest tightness and shortness of breath.   Cardiovascular:  Negative for chest pain, palpitations and leg swelling.  Gastrointestinal:  Negative for abdominal distention, abdominal pain, constipation, diarrhea, nausea and vomiting.  Musculoskeletal:  Positive for arthralgias, back pain and gait problem.  Skin: Negative.   Psychiatric/Behavioral: Negative.      Objective:  Physical Exam Constitutional:      Appearance: She is well-developed.  HENT:     Head: Normocephalic and atraumatic.  Cardiovascular:     Rate and Rhythm: Normal rate and regular rhythm.  Pulmonary:     Effort: Pulmonary effort is normal. No respiratory distress.     Breath sounds: Normal breath sounds. No wheezing or rales.  Abdominal:     General: Bowel sounds are normal. There is no distension.     Palpations: Abdomen is soft.     Tenderness: There is no abdominal tenderness. There is no rebound.  Musculoskeletal:        General: Tenderness present.     Cervical back: Normal range of motion.  Skin:    General: Skin is warm and dry.  Neurological:     Mental Status: She is alert and oriented to person, place, and time.     Coordination: Coordination abnormal.     Vitals:   04/03/22 1527  BP: 118/60  Pulse: 72  Temp: (!) 97.5 F (36.4 C)  TempSrc: Oral  SpO2: 95%  Weight: 185 lb (83.9 kg)  Height: '5\' 2"'$  (1.575 m)    Assessment & Plan:

## 2022-04-03 NOTE — Patient Instructions (Signed)
We have printed the labs for you from [place].  We have sent in the cream and the hydrocodone.

## 2022-04-06 ENCOUNTER — Encounter (HOSPITAL_COMMUNITY): Payer: Self-pay

## 2022-04-06 DIAGNOSIS — F1120 Opioid dependence, uncomplicated: Secondary | ICD-10-CM | POA: Insufficient documentation

## 2022-04-06 NOTE — Assessment & Plan Note (Signed)
Reviewed [place] narcotic database. She will resume prior pain contract with hydrocodone 10/325 #60/month for her chronic arthritis and back pain.

## 2022-05-03 ENCOUNTER — Other Ambulatory Visit: Payer: Self-pay | Admitting: Internal Medicine

## 2022-05-07 ENCOUNTER — Ambulatory Visit: Payer: HMO | Admitting: Podiatry

## 2022-05-18 ENCOUNTER — Ambulatory Visit: Payer: HMO | Admitting: Cardiovascular Disease

## 2022-05-18 ENCOUNTER — Other Ambulatory Visit: Payer: Self-pay | Admitting: Cardiovascular Disease

## 2022-05-18 ENCOUNTER — Encounter: Payer: Self-pay | Admitting: Cardiovascular Disease

## 2022-05-18 VITALS — BP 124/62 | HR 76 | Ht 62.0 in | Wt 187.0 lb

## 2022-05-18 DIAGNOSIS — E1169 Type 2 diabetes mellitus with other specified complication: Secondary | ICD-10-CM | POA: Diagnosis not present

## 2022-05-18 DIAGNOSIS — I5032 Chronic diastolic (congestive) heart failure: Secondary | ICD-10-CM | POA: Diagnosis not present

## 2022-05-18 DIAGNOSIS — I4821 Permanent atrial fibrillation: Secondary | ICD-10-CM | POA: Diagnosis not present

## 2022-05-18 DIAGNOSIS — I10 Essential (primary) hypertension: Secondary | ICD-10-CM | POA: Diagnosis not present

## 2022-05-18 DIAGNOSIS — E785 Hyperlipidemia, unspecified: Secondary | ICD-10-CM

## 2022-05-18 DIAGNOSIS — D6869 Other thrombophilia: Secondary | ICD-10-CM | POA: Diagnosis not present

## 2022-05-18 NOTE — Patient Instructions (Signed)
Medication Instructions:  No changes *If you need a refill on your cardiac medications before your next appointment, please call your pharmacy*   Lab Work: Your provider would like for you to have the following labs today: CBC, BMET, Magnesium, A1C, Lipid, BNP  If you have labs (blood work) drawn today and your tests are completely normal, you will receive your results only by: [place] (if you have MyChart) OR A paper copy in the mail If you have any lab test that is abnormal or we need to change your treatment, we will call you to review the results.   Testing/Procedures: None ordered   Follow-Up: At [facility], you and your health needs are our priority.  As part of our continuing mission to provide you with exceptional heart care, we have created designated Provider Care Teams.  These Care Teams include your primary Cardiologist (physician) and Advanced Practice Providers (APPs -  Physician Assistants and Nurse Practitioners) who all work together to provide you with the care you need, when you need it.  We recommend signing up for the patient portal called "MyChart".  Sign up information is provided on this After Visit Summary.  MyChart is used to connect with patients for Virtual Visits (Telemedicine).  Patients are able to view lab/test results, encounter notes, upcoming appointments, etc.  Non-urgent messages can be sent to your provider as well.   To learn more about what you can do with MyChart, go to [email or web address].    Your next appointment:   3 month(s)  The format for your next appointment:   In Person  Provider:   Sanda Klein, MD     Important Information About Sugar

## 2022-05-18 NOTE — Progress Notes (Addendum)
Cardiology Office Note:    Date:  05/19/2022   ID:  [name], DOB 11-12-1950, MRN [id number]  PCP:  Hoyt Koch, MD  Cardiologist:  Lakeria Starkman  (CHF clinic, [place]) Electrophysiologist:  None   Referring MD: Hoyt Koch, *   Chief Complaint  Patient presents with   Shortness of Breath   Fatigue          History of Present Illness:    [name] is a 71 y.o. female with a hx of CAD (posterior lateral ventricular branch stent in 2009), longstanding persistent atrial fibrillation, 3, diabetes mellitus type 2, hypertension, pericardiocentesis for hemothorax with tamponade in June 2020, while on treatment with oral anticoagulant. She is a retired Electrical engineer.  She complains today of worsening weakness and fatigue, dyspnea (NYHA functional class III), abdominal distention which have not improved despite the fact that she is increased her torsemide to 40 mg daily for the last week (double her previous dose).  She has been avoiding sodium rich foods.  She has not had palpitations, dizziness or syncope.  She is compliant with oral anticoagulants and has not had any focal neurological complaints, falls, injuries or bleeding.  She does not have chest pain either at rest or with activity.  She has not had any lower extremity edema.  Her weight is higher than at her previous appointment by 2 pounds, but is roughly 15-17 pounds higher than her previous estimation of her "dry weight".  She continues to sleep in a recliner as she has ever since her hip surgery.  She has been having problems with back pain and has been taking Celebrex twice daily and is planning to undergo back surgery in the future.  At her last appointment we reassessed her "dry weight" to be around 170 pounds.  On 01/19/2022 she weighed 177 pounds.  At her August appointment she weighed 185 pounds.  Today she weighs 187 pounds.    Roughly a year ago she was hospitalized for lower GI bleeding with  bright blood blood per rectum and a hemoglobin as low as 6.  Work-up with colonoscopy, upper endoscopy and eventually capsule enteroscopy did not really show [place] clear source of bleeding, but there were a couple of AVMs in the colon, 1 in the cecum that appeared to be friable.  This was treated with electrocautery.  She has been on anticoagulation since then without overt bleeding problems.  Her most recent hemoglobin was 10.8 on 03/26/2022  Glycemic control has been fair with a hemoglobin A1c of 7.9%.  Her LDL cholesterol most recently was very good at 36.  She had a nuclear stress test in June 12, 2021 showed low risk findings without evidence of ischemia or previous infarction.  Her last echocardiogram in April 2021 showed LVEF of 50-55%.  This was a decline from June 2020 when the EF was 60%.  The most recent echocardiogram performed in April 2021 showed LVEF of 50-55%.  Both atria are moderately dilated.  There were no serious valvular abnormalities.  She had a normal perfusion pattern on her nuclear stress tests from 2012, 2014, 2016 and October 2022.  Past Medical History:  Diagnosis Date   Anemia, unspecified    Arthritis    Asthma    Atrial fibrillation with RVR ([place])    a. on Xarelto   Bell's palsy    Facial nerve decompression in 2001   CHF (congestive heart failure) (HCC)    Chronic low back pain  COPD with asthma    Coronary artery disease    Myoview 04/12/11 was entirely normal. ECHO 02/26/08 showed only minor abnormalities. Stenting 05/26/08 of her posterolateral branch to the left circumflex coronary artery. Used a 2.5x66m Taxus Monorail stent.myoview 2014 was without ischemia   Diabetes mellitus    Type 2   Early cataracts, bilateral    Fatty liver    GERD (gastroesophageal reflux disease)    Glaucoma    Glaucoma    Goiter    Heart murmur    History of nuclear stress test 2012; 2014   lexiscan; normal pattern of perfusion; normal, low risk scan    Hyperlipidemia     Hypertension    Panic disorder    Pneumonia 2008   Polycystic ovary disease    Hysterectomy in 1982 for this   Shortness of breath dyspnea    ECHO 02/26/08 showed only minor abnormalities   Spinal stenosis     Past Surgical History:  Procedure Laterality Date   ABDOMINAL HYSTERECTOMY  1982   & BSO; for polycystic ovary disease   CARDIOVERSION N/A 12/17/2013   Procedure: CARDIOVERSION;  Surgeon: KPixie Casino MD;  [facility]  Service: Cardiovascular;  Laterality: N/A;   CARDIOVERSION N/A 09/30/2015   Procedure: CARDIOVERSION;  Surgeon: KDorothy Spark MD;  [facility]  Service: Cardiovascular;  Laterality: N/A;   CARDIOVERSION N/A 06/22/2016   Procedure: CARDIOVERSION;  Surgeon: TSkeet Latch MD;  [place]: [facility]  Service: Cardiovascular;  Laterality: N/A;   CENTRAL LINE INSERTION  01/13/2019   Procedure: CENTRAL LINE INSERTION;  Surgeon: BJolaine Artist MD;  [facility];  Service: Cardiovascular;;   COLONOSCOPY     last 2009; Dr BCristina Gong due 2019   COLONOSCOPY WITH PROPOFOL N/A 01/23/2018   Procedure: COLONOSCOPY WITH PROPOFOL;  Surgeon: BRonald Lobo MD;  [place] [facility]  Service: Endoscopy;  Laterality: N/A;   COLONOSCOPY WITH PROPOFOL N/A 11/04/2020   Procedure: COLONOSCOPY WITH PROPOFOL;  Surgeon: BRonald Lobo MD;  [place] [facility]  Service: Endoscopy;  Laterality: N/A;   CORONARY ANGIOPLASTY  05/26/2008   Stenting of her posterolateral branch to the left circumflex coronary artery. Used a 2.5x150mTaxus Monorail stent.   ESOPHAGOGASTRODUODENOSCOPY (EGD) WITH PROPOFOL N/A 01/23/2018   Procedure: ESOPHAGOGASTRODUODENOSCOPY (EGD) WITH PROPOFOL;  Surgeon: BuRonald LoboMD;  [facility] Service: Endoscopy;  Laterality: N/A;   ESOPHAGOGASTRODUODENOSCOPY (EGD) WITH PROPOFOL N/A 11/04/2020   Procedure: ESOPHAGOGASTRODUODENOSCOPY (EGD) WITH PROPOFOL;  Surgeon: BuRonald LoboMD;  [facility] Service: Endoscopy;  Laterality: N/A;   FACIAL NERVE DECOMPRESSION  2001/2002   bells palsy    GIVENS CAPSULE STUDY N/A 11/04/2020   Procedure: GIVENS CAPSULE STUDY;  Surgeon: BuRonald LoboMD;  [facility] Service: Endoscopy;  Laterality: N/A;   HOT HEMOSTASIS N/A 11/04/2020   Procedure: HOT HEMOSTASIS (ARGON PLASMA COAGULATION/BICAP);  Surgeon: BuRonald LoboMD;  [facility];  Service: Endoscopy;  Laterality: N/A;   LAPAROSCOPIC CHOLECYSTECTOMY  06/15/2011    Dr InDalbert Batman PERICARDIOCENTESIS N/A 01/13/2019   Procedure: PERICARDIOCENTESIS;  Surgeon: McBurnell BlanksMD;  [facility];  Service: Cardiovascular;  Laterality: N/A;   POLYPECTOMY  11/04/2020   Procedure: POLYPECTOMY;  Surgeon: BuRonald LoboMD;  [facility] Service: Endoscopy;;   RIGHT AND LEFT HEART CATH N/A 01/13/2019   Procedure: RIGHT AND LEFT HEART CATH;  Surgeon: BeJolaine ArtistMD;  [facility];  Service: Cardiovascular;  Laterality: N/A;   TEE WITHOUT CARDIOVERSION N/A 12/17/2013   Procedure: TRANSESOPHAGEAL ECHOCARDIOGRAM (TEE);  Surgeon: Pixie Casino, MD;  [facility]COPY;  Service: Cardiovascular;  Laterality: N/A;  trish/ja   TOTAL HIP ARTHROPLASTY Left 09/25/2021   Procedure: LEFT TOTAL HIP ARTHROPLASTY ANTERIOR APPROACH;  Surgeon: Frederik Pear, MD;  [facility];  Service: Orthopedics;  Laterality: Left;   TRANSTHORACIC ECHOCARDIOGRAM  1/61/0960   LV systolic function normal with mild conc LVH; LA mildly dilated; trace MR/TR   UPPER GI ENDOSCOPY  2009   negative    Current Medications: Current Meds  Medication Sig   Accu-Chek Softclix Lancets lancets Use as instructed   ascorbic acid (VITAMIN C) 1000 MG tablet Take 1,000 mg by mouth daily.   celecoxib (CELEBREX) 100 MG capsule Take 1 capsule (100 mg total) by mouth 2 (two) times daily.   colestipol (COLESTID) 1 g tablet Take 0.5 g by mouth 2 (two)  times daily.   diltiazem (CARDIZEM CD) 180 MG 24 hr capsule TAKE ONE CAPSULE ONCE DAILY   famotidine (PEPCID) 20 MG tablet Take 20 mg by mouth 2 (two) times daily.   gabapentin (NEURONTIN) 100 MG capsule TAKE ONE CAPSULE BY MOUTH THREE TIMES DAILY   glucose blood (FREESTYLE LITE) test strip Use as instructed   guaiFENesin (MUCINEX) 600 MG 12 hr tablet Take 600 mg by mouth 2 (two) times daily.   HYDROcodone-acetaminophen (NORCO/VICODIN) 5-325 MG tablet Take 1 tablet by mouth 2 (two) times daily as needed for moderate pain.   INVOKANA 100 MG TABS tablet TAKE ONE TABLET BY MOUTH DAILY BEFORE BREAKFAST.[name]   latanoprost (XALATAN) 0.005 % ophthalmic solution Place 1 drop into both eyes at bedtime.   lisinopril (ZESTRIL) 20 MG tablet TAKE ONE TABLET BY MOUTH DAILY   LORazepam (ATIVAN) 0.5 MG tablet 1 po bid prn   metoprolol succinate (TOPROL-XL) 100 MG 24 hr tablet TAKE ONE TABLET BY MOUTH TWICE DAILY   metoprolol tartrate (LOPRESSOR) 25 MG tablet Take 1 tablet (25 mg total) by mouth daily as needed (if HR increases).   Multiple Minerals-Vitamins (CALCIUM CITRATE PLUS/MAGNESIUM PO) Take 400 mg by mouth daily.   nystatin (MYCOSTATIN/NYSTOP) powder Apply 1 application topically once a week.   nystatin ointment (MYCOSTATIN) Apply 1 application topically 2 (two) times daily as needed (IRRITATION). (Patient taking differently: Apply 1 application  topically daily as needed (IRRITATION).)   PARoxetine (PAXIL) 10 MG tablet TAKE 2 AND 1/2 TABLETS BY MOUTH ONCE DAILY   potassium chloride (MICRO-K) 10 MEQ CR capsule Take 1 capsule (10 mEq total) by mouth daily.   rivaroxaban (XARELTO) 10 MG TABS tablet Take 1 tablet (10 mg total) by mouth daily.   rOPINIRole (REQUIP XL) 2 MG 24 hr tablet Take 1 tablet (2 mg total) by mouth at bedtime.   rosuvastatin (CRESTOR) 10 MG tablet TAKE ONE TABLET AT BEDTIME.   senna (SENOKOT) 8.6 MG TABS Take 1 tablet by mouth at bedtime as needed for mild constipation.    sitaGLIPtin-metformin (JANUMET) 50-1000 MG tablet Take 1 tablet by mouth 2 (two) times daily with a meal.   torsemide (DEMADEX) 20 MG tablet Take 1 tablet (20 mg total) by mouth daily.   traZODone (DESYREL) 100 MG tablet Up to 200 mg at bedtime for sleep (Patient taking differently: Take 150 mg by mouth at bedtime.)   triamcinolone cream (KENALOG) 0.1 % Apply 1 Application topically 2 (two) times daily.   umeclidinium-vilanterol (ANORO ELLIPTA) 62.5-25 MCG/ACT AEPB Inhale 1  puff into the lungs daily.     Allergies:   Benadryl [diphenhydramine hcl], Clopidogrel bisulfate, Ioversol, Benadryl [diphenhydramine], and Iodinated contrast media   Social History   Socioeconomic History   Marital status: Married    Spouse name: Not on file   Number of children: 2   Years of education: master's   Highest education level: Not on file  Occupational History   Occupation: Electrical engineer    Employer: [employer]  Tobacco Use   Smoking status: Former    Packs/day: 1.50    Years: 30.00    Total pack years: 45.00    Types: Cigarettes    Quit date: 08/13/1993    Years since quitting: 28.7   Smokeless tobacco: Never  Vaping Use   Vaping Use: Never used  Substance and Sexual Activity   Alcohol use: No   Drug use: No   Sexual activity: Not on file  Other Topics Concern   Not on file  Social History Narrative   ** Merged History Encounter **       Social Determinants of Health   Financial Resource Strain: Not on file  Food Insecurity: Not on file  Transportation Needs: Not on file  Physical Activity: Not on file  Stress: Not on file  Social Connections: Not on file     Family History: The patient's family history includes Cancer in her brother and sister; Colon cancer (age of onset: 12) in her brother; Colon polyps in her sister; Emphysema (age of onset: 41) in her mother; Gout in her brother; Heart attack (age of onset: 75) in her father; Hyperlipidemia in her brother;  Hypertension in her brother; Pneumonia in her maternal grandmother; Ulcers in her mother. There is no history of Diabetes or Stroke.  ROS:   Please see the history of present illness.    All other systems are reviewed and are negative.   EKGs/Labs/Other Studies Reviewed:    The following studies were reviewed today: Echocardiogram December 09, 2019  1. Abnormal septal motion and hypokinesis as well as inferior basal  hypokinesis EF similar to echo done 05/2019 or slightly better . Left  ventricular ejection fraction, by estimation, is 50 to 55%. The left  ventricle has low normal function. The left  ventricle demonstrates regional wall motion abnormalities (see scoring  diagram/findings for description). Left ventricular diastolic parameters  are indeterminate.   2. Right ventricular systolic function is normal. The right ventricular  size is normal.   3. Left atrial size was moderately dilated.   4. Right atrial size was moderately dilated.   5. The mitral valve is degenerative. Trivial mitral valve regurgitation.  No evidence of mitral stenosis.   6. The aortic valve is tricuspid. Aortic valve regurgitation is trivial.  Mild to moderate aortic valve sclerosis/calcification is present, without  any evidence of aortic stenosis.   7. The inferior vena cava is normal in size with greater than 50%  respiratory variability, suggesting right atrial pressure of 3 mmHg.   Nuclear stress test 06/12/2021   Findings are consistent with no prior ischemia and no prior myocardial infarction. The study is low risk.   No ST deviation was noted.   LV perfusion is normal. There is no evidence of ischemia. There is no evidence of infarction.   End diastolic cavity size is normal. End systolic cavity size is normal.   Normal perfusion EF may not be accurate due to afib estimated at 51% no RWMAls noted  EKG:  EKG is not ordered today.  Personally reviewed her ECG from 02/22/2022 which shows atrial  fibrillation with controlled ventricular response at 87 bpm.  No ischemic repolarization abnormalities are seen.  She has had varying degrees of left axis deviation, sometimes meeting criteria for left anterior fascicular block  Recent Labs:  02/22/2022 hemoglobin A1c 7.9% 10/18/2021: Pro B Natriuretic peptide (BNP) 108.0 05/18/2022: BNP WILL FOLLOW; BUN 25; Creatinine, Ser 1.44; Hemoglobin 7.9; Magnesium 1.8; Platelets 235; Potassium 5.2; Sodium 128   11/14/2020  Creatinine 1.33, potassium 4.3, hemoglobin 10.3 Recent Lipid Panel    Component Value Date/Time   CHOL 110 05/18/2022 1623   TRIG 208 (H) 05/18/2022 1623   HDL 44 05/18/2022 1623   CHOLHDL 2.5 05/18/2022 1623   CHOLHDL 3 09/23/2020 1601   VLDL 30.0 09/23/2020 1601   LDLCALC 33 05/18/2022 1623   LDLDIRECT 143.4 01/23/2008 1316    Physical Exam:    VS:  BP 124/62   Pulse 76   Ht '5\' 2"'$  (1.575 m)   Wt 187 lb (84.8 kg)   SpO2 92%   BMI 34.20 kg/m     Wt Readings from Last 3 Encounters:  05/18/22 187 lb (84.8 kg)  04/03/22 185 lb (83.9 kg)  02/22/22 185 lb 6.4 oz (84.1 kg)      General: Alert, oriented x3, no distress, she does not appear to be pale Head: no evidence of trauma, PERRL, EOMI, no exophtalmos or lid lag, no myxedema, no xanthelasma; normal ears, nose and oropharynx Neck: Hard to see, but I think there is about an 8-10 cm elevation in jugular venous pulsations and no hepatojugular reflux; brisk carotid pulses without delay and no carotid bruits Chest: clear to auscultation, no signs of consolidation by percussion or palpation, normal fremitus, symmetrical and full respiratory excursions Cardiovascular: normal position and quality of the apical impulse, irregular rhythm, normal first and second heart sounds, no murmurs, rubs or gallops Abdomen: Appears distended, but no ascites detected, no masses by palpation, no abnormal pulsatility or arterial bruits, normal bowel sounds, Extremities: no clubbing,  cyanosis or edema; 2+ radial, ulnar and brachial pulses bilaterally; 2+ right femoral, posterior tibial and dorsalis pedis pulses; 2+ left femoral, posterior tibial and dorsalis pedis pulses; no subclavian or femoral bruits Neurological: grossly nonfocal Psych: Normal mood and affect   ASSESSMENT:    1. Permanent atrial fibrillation ([place])   2. Chronic diastolic CHF (congestive heart failure) ([place])   3. Essential hypertension   4. Hyperlipidemia associated with type 2 diabetes mellitus ([place])   5. Type 2 diabetes mellitus with other specified complication, without long-term current use of insulin ([place])   6. Acquired thrombophilia ([place])       PLAN:    In order of problems listed above:  AFib: Well rate controlled, unaware of palpitations.  Rate control is critical since she has a history of tachycardia cardiomyopathy.  On appropriate anticoagulation.     CHA2DS2-VASc 6 (age, gender, hypertension, diabetes, CAD, history of heart failure/LV dysfunction).  We have previously discussed alternatives to anticoagulation.  I believe she is an excellent candidate for a Watchman device.  She was skeptical at her last appointment.  Reinforced my recommendation today. CHF: She appears to be hypervolemic, maybe as much as 17 pounds above "dry weight" that is probably around 170 pounds; has gained 8 pounds just in a couple of weeks.  This has occurred despite the fact that she is not taking twice the previously prescribed dose of torsemide.  She is on SGLT2 inhibitor, albeit not 1 that is approved for CHF.  On beta-blockers and ACE inhibitor.  Preserved LVEF.  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  Before we increase her diuretics I would like to recheck her labs, particular potassium level to make sure her fatigue and weakness is not due to hypokalemia or hyponatremia. Anticoagulation: Currently well-tolerated without overt bleeding problems. Hemoglobin had been improving with iron supplements  but should be rechecked with her new symptoms.. She has a history of hemopericardium, previous iron deficiency anemia due to lower GI bleeding, recurrent anemia following pelvic fracture.  I think Watchman would be a good idea. CAD: Denies angina.  Low risk recent nuclear stress test.  Near normal LVEF. HTN: Very well controlled. HLP: Excellent LDL on statin.  Chronically low HDL. DM: Borderline adequate glycemic control with hemoglobin A1c 7.9%. Periodic limb movement syndrome: Saw Dr. Brett Fairy for sleep clinic evaluation in March 2021.  On Requip.  COPD: Saw Dr. Annamaria Boots in January 2022.  FEV1 not too bad at 1.66 L / 82% of predicted. Aortic atherosclerosis: Aortic calcification with normal caliber aorta seen on chest x-ray and chest CT. continue statin therapy.  I have seen [name] is a 71 y.o. female in the office today who is being considered for a Watchman left atrial appendage closure device.  She has a history of permanent atrial fibrillation.  This patients CHA2DS2-VASc Score and unadjusted Ischemic Stroke Rate (% per year) is equal to 9.7 % stroke rate/year from a score of 6 which necessitates long term oral anticoagulation to prevent stroke.  Unfortunately, She is not felt to be a long term Warfarin candidate secondary to recurrent iron deficiency anemia due to GI bleeding.  The patients chart has been reviewed and I feel that they would be a candidate for short term oral anticoagulation.  Procedural risks for the Watchman implant have been reviewed with the patient including a 1% risk of stroke, 2% risk of perforation, 0.1% risk of device embolization.  Given the patient's poor candidacy for long-term oral anticoagulation and ability to tolerate short term oral anticoagulation I have recommended the watchman left atrial appendage closure system.   Medication Adjustments/Labs and Tests Ordered: Current medicines are reviewed at length with the patient today.  Concerns regarding medicines  are outlined above.  Orders Placed This Encounter  Procedures   Basic metabolic panel   Brain natriuretic peptide   CBC   Lipid panel   Hemoglobin A1c   Magnesium    No orders of the defined types were placed in this encounter.    Patient Instructions  Medication Instructions:  No changes *If you need a refill on your cardiac medications before your next appointment, please call your pharmacy*   Lab Work: Your provider would like for you to have the following labs today: CBC, BMET, Magnesium, A1C, Lipid, BNP  If you have labs (blood work) drawn today and your tests are completely normal, you will receive your results only by: [place] (if you have MyChart) OR A paper copy in the mail If you have any lab test that is abnormal or we need to change your treatment, we will call you to review the results.   Testing/Procedures: None ordered   Follow-Up: At [facility], you and your health needs are our priority.  As part of our continuing mission to provide you with exceptional heart care, we have created designated Provider Care Teams.  These Care Teams include your  primary Cardiologist (physician) and Advanced Practice Providers (APPs -  Physician Assistants and Nurse Practitioners) who all work together to provide you with the care you need, when you need it.  We recommend signing up for the patient portal called "MyChart".  Sign up information is provided on this After Visit Summary.  MyChart is used to connect with patients for Virtual Visits (Telemedicine).  Patients are able to view lab/test results, encounter notes, upcoming appointments, etc.  Non-urgent messages can be sent to your provider as well.   To learn more about what you can do with MyChart, go to [email or web address].    Your next appointment:   3 month(s)  The format for your next appointment:   In Person  Provider:   Sanda Klein, MD     Important Information About  [place]         Signed, Sanda Klein, MD  05/19/2022 8:12 AM    [place]

## 2022-05-19 ENCOUNTER — Encounter: Payer: Self-pay | Admitting: Cardiovascular Disease

## 2022-05-19 ENCOUNTER — Inpatient Hospital Stay
Admission: RE | Admit: 2022-05-19 | Discharge: 2022-05-22 | DRG: 378 | Disposition: A | Discharge status: Home / Self Care | Payer: HMO | Source: Ambulatory Visit | Attending: Cardiology | Admitting: Cardiology

## 2022-05-19 DIAGNOSIS — Z8701 Personal history of pneumonia (recurrent): Secondary | ICD-10-CM

## 2022-05-19 DIAGNOSIS — H409 Unspecified glaucoma: Secondary | ICD-10-CM | POA: Diagnosis present

## 2022-05-19 DIAGNOSIS — E1169 Type 2 diabetes mellitus with other specified complication: Secondary | ICD-10-CM | POA: Diagnosis present

## 2022-05-19 DIAGNOSIS — I700 Atherosclerosis of aorta: Secondary | ICD-10-CM | POA: Diagnosis present

## 2022-05-19 DIAGNOSIS — D6869 Other thrombophilia: Secondary | ICD-10-CM | POA: Diagnosis not present

## 2022-05-19 DIAGNOSIS — I110 Hypertensive heart disease with heart failure: Secondary | ICD-10-CM | POA: Diagnosis present

## 2022-05-19 DIAGNOSIS — K5521 Angiodysplasia of colon with hemorrhage: Secondary | ICD-10-CM | POA: Diagnosis present

## 2022-05-19 DIAGNOSIS — R011 Cardiac murmur, unspecified: Secondary | ICD-10-CM | POA: Diagnosis present

## 2022-05-19 DIAGNOSIS — Z8249 Family history of ischemic heart disease and other diseases of the circulatory system: Secondary | ICD-10-CM | POA: Diagnosis not present

## 2022-05-19 DIAGNOSIS — R718 Other abnormality of red blood cells: Secondary | ICD-10-CM | POA: Diagnosis present

## 2022-05-19 DIAGNOSIS — Z96642 Presence of left artificial hip joint: Secondary | ICD-10-CM | POA: Diagnosis present

## 2022-05-19 DIAGNOSIS — J4489 Other specified chronic obstructive pulmonary disease: Secondary | ICD-10-CM | POA: Diagnosis present

## 2022-05-19 DIAGNOSIS — G4761 Periodic limb movement disorder: Secondary | ICD-10-CM | POA: Diagnosis present

## 2022-05-19 DIAGNOSIS — G8929 Other chronic pain: Secondary | ICD-10-CM | POA: Diagnosis present

## 2022-05-19 DIAGNOSIS — E785 Hyperlipidemia, unspecified: Secondary | ICD-10-CM | POA: Diagnosis present

## 2022-05-19 DIAGNOSIS — E861 Hypovolemia: Secondary | ICD-10-CM | POA: Diagnosis present

## 2022-05-19 DIAGNOSIS — K760 Fatty (change of) liver, not elsewhere classified: Secondary | ICD-10-CM | POA: Diagnosis present

## 2022-05-19 DIAGNOSIS — Z955 Presence of coronary angioplasty implant and graft: Secondary | ICD-10-CM | POA: Diagnosis not present

## 2022-05-19 DIAGNOSIS — Z79899 Other long term (current) drug therapy: Secondary | ICD-10-CM

## 2022-05-19 DIAGNOSIS — Z7984 Long term (current) use of oral hypoglycemic drugs: Secondary | ICD-10-CM

## 2022-05-19 DIAGNOSIS — Z791 Long term (current) use of non-steroidal anti-inflammatories (NSAID): Secondary | ICD-10-CM

## 2022-05-19 DIAGNOSIS — Z91148 Patient's other noncompliance with medication regimen for other reason: Secondary | ICD-10-CM

## 2022-05-19 DIAGNOSIS — Z86010 Personal history of colonic polyps: Secondary | ICD-10-CM | POA: Diagnosis not present

## 2022-05-19 DIAGNOSIS — Z83719 Family history of colon polyps, unspecified: Secondary | ICD-10-CM | POA: Diagnosis not present

## 2022-05-19 DIAGNOSIS — Z825 Family history of asthma and other chronic lower respiratory diseases: Secondary | ICD-10-CM | POA: Diagnosis not present

## 2022-05-19 DIAGNOSIS — M1990 Unspecified osteoarthritis, unspecified site: Secondary | ICD-10-CM | POA: Diagnosis present

## 2022-05-19 DIAGNOSIS — R0609 Other forms of dyspnea: Secondary | ICD-10-CM | POA: Diagnosis not present

## 2022-05-19 DIAGNOSIS — I2510 Atherosclerotic heart disease of native coronary artery without angina pectoris: Secondary | ICD-10-CM | POA: Diagnosis not present

## 2022-05-19 DIAGNOSIS — Z91041 Radiographic dye allergy status: Secondary | ICD-10-CM

## 2022-05-19 DIAGNOSIS — Z888 Allergy status to other drugs, medicaments and biological substances status: Secondary | ICD-10-CM

## 2022-05-19 DIAGNOSIS — Z83438 Family history of other disorder of lipoprotein metabolism and other lipidemia: Secondary | ICD-10-CM

## 2022-05-19 DIAGNOSIS — K219 Gastro-esophageal reflux disease without esophagitis: Secondary | ICD-10-CM | POA: Diagnosis present

## 2022-05-19 DIAGNOSIS — I4821 Permanent atrial fibrillation: Secondary | ICD-10-CM | POA: Diagnosis present

## 2022-05-19 DIAGNOSIS — I5032 Chronic diastolic (congestive) heart failure: Secondary | ICD-10-CM | POA: Diagnosis not present

## 2022-05-19 DIAGNOSIS — D649 Anemia, unspecified: Principal | ICD-10-CM | POA: Diagnosis present

## 2022-05-19 DIAGNOSIS — R0789 Other chest pain: Secondary | ICD-10-CM | POA: Diagnosis present

## 2022-05-19 DIAGNOSIS — D500 Iron deficiency anemia secondary to blood loss (chronic): Secondary | ICD-10-CM | POA: Diagnosis not present

## 2022-05-19 DIAGNOSIS — Z7901 Long term (current) use of anticoagulants: Secondary | ICD-10-CM

## 2022-05-19 DIAGNOSIS — H269 Unspecified cataract: Secondary | ICD-10-CM | POA: Diagnosis present

## 2022-05-19 DIAGNOSIS — Z9049 Acquired absence of other specified parts of digestive tract: Secondary | ICD-10-CM

## 2022-05-19 DIAGNOSIS — M545 Low back pain, unspecified: Secondary | ICD-10-CM | POA: Diagnosis present

## 2022-05-19 DIAGNOSIS — Z800 Family history of malignant neoplasm of digestive organs: Secondary | ICD-10-CM

## 2022-05-19 DIAGNOSIS — F410 Panic disorder [episodic paroxysmal anxiety] without agoraphobia: Secondary | ICD-10-CM | POA: Diagnosis present

## 2022-05-19 DIAGNOSIS — I429 Cardiomyopathy, unspecified: Secondary | ICD-10-CM | POA: Diagnosis present

## 2022-05-19 DIAGNOSIS — K921 Melena: Secondary | ICD-10-CM | POA: Diagnosis present

## 2022-05-19 DIAGNOSIS — Z87891 Personal history of nicotine dependence: Secondary | ICD-10-CM

## 2022-05-19 DIAGNOSIS — Z90710 Acquired absence of both cervix and uterus: Secondary | ICD-10-CM

## 2022-05-19 DIAGNOSIS — E871 Hypo-osmolality and hyponatremia: Secondary | ICD-10-CM | POA: Diagnosis not present

## 2022-05-19 DIAGNOSIS — I480 Paroxysmal atrial fibrillation: Secondary | ICD-10-CM

## 2022-05-19 DIAGNOSIS — Z90722 Acquired absence of ovaries, bilateral: Secondary | ICD-10-CM

## 2022-05-19 DIAGNOSIS — E049 Nontoxic goiter, unspecified: Secondary | ICD-10-CM | POA: Diagnosis present

## 2022-05-19 DIAGNOSIS — Z8679 Personal history of other diseases of the circulatory system: Secondary | ICD-10-CM

## 2022-05-19 DIAGNOSIS — N179 Acute kidney failure, unspecified: Secondary | ICD-10-CM | POA: Diagnosis not present

## 2022-05-19 LAB — CBC WITH DIFFERENTIAL/PLATELET
Abs Immature Granulocytes: 0.03 10*3/uL (ref 0.00–0.07)
Basophils Absolute: 0.1 10*3/uL (ref 0.0–0.1)
Basophils Relative: 1 %
Eosinophils Absolute: 0.1 10*3/uL (ref 0.0–0.5)
Eosinophils Relative: 2 %
HCT: 24 % — ABNORMAL LOW (ref 36.0–46.0)
Hemoglobin: 8.1 g/dL — ABNORMAL LOW (ref 12.0–15.0)
Immature Granulocytes: 1 %
Lymphocytes Relative: 12 %
Lymphs Abs: 0.7 10*3/uL (ref 0.7–4.0)
MCH: 26.6 pg (ref 26.0–34.0)
MCHC: 33.8 g/dL (ref 30.0–36.0)
MCV: 78.7 fL — ABNORMAL LOW (ref 80.0–100.0)
Monocytes Absolute: 0.6 10*3/uL (ref 0.1–1.0)
Monocytes Relative: 10 %
Neutro Abs: 4.6 10*3/uL (ref 1.7–7.7)
Neutrophils Relative %: 74 %
Platelets: 206 10*3/uL (ref 150–400)
RBC: 3.05 MIL/uL — ABNORMAL LOW (ref 3.87–5.11)
RDW: 14.1 % (ref 11.5–15.5)
WBC: 6.1 10*3/uL (ref 4.0–10.5)
nRBC: 0 % (ref 0.0–0.2)

## 2022-05-19 LAB — BASIC METABOLIC PANEL
BUN/Creatinine Ratio: 17 (ref 12–28)
BUN: 25 mg/dL (ref 8–27)
CO2: 19 mmol/L — ABNORMAL LOW (ref 20–29)
Calcium: 8.9 mg/dL (ref 8.7–10.3)
Chloride: 93 mmol/L — ABNORMAL LOW (ref 96–106)
Creatinine, Ser: 1.44 mg/dL — ABNORMAL HIGH (ref 0.57–1.00)
Glucose: 198 mg/dL — ABNORMAL HIGH (ref 70–99)
Potassium: 5.2 mmol/L (ref 3.5–5.2)
Sodium: 128 mmol/L — ABNORMAL LOW (ref 134–144)
eGFR: 38 mL/min/{1.73_m2} — ABNORMAL LOW (ref >59)

## 2022-05-19 LAB — COMPREHENSIVE METABOLIC PANEL
ALT: 16 U/L (ref 0–44)
AST: 22 U/L (ref 15–41)
Albumin: 3.9 g/dL (ref 3.5–5.0)
Alkaline Phosphatase: 55 U/L (ref 38–126)
Anion gap: 10 (ref 5–15)
BUN: 22 mg/dL (ref 8–23)
CO2: 22 mmol/L (ref 22–32)
Calcium: 8.9 mg/dL (ref 8.9–10.3)
Chloride: 93 mmol/L — ABNORMAL LOW (ref 98–111)
Creatinine, Ser: 1.39 mg/dL — ABNORMAL HIGH (ref 0.44–1.00)
GFR, Estimated: 40 mL/min — ABNORMAL LOW (ref >60)
Glucose, Bld: 196 mg/dL — ABNORMAL HIGH (ref 70–99)
Potassium: 4.8 mmol/L (ref 3.5–5.1)
Sodium: 125 mmol/L — ABNORMAL LOW (ref 135–145)
Total Bilirubin: 0.5 mg/dL (ref 0.3–1.2)
Total Protein: 7 g/dL (ref 6.5–8.1)

## 2022-05-19 LAB — CBC
Hematocrit: 25.2 % — ABNORMAL LOW (ref 34.0–46.6)
Hemoglobin: 7.9 g/dL — ABNORMAL LOW (ref 11.1–15.9)
MCH: 26.1 pg — ABNORMAL LOW (ref 26.6–33.0)
MCHC: 31.3 g/dL — ABNORMAL LOW (ref 31.5–35.7)
MCV: 83 fL (ref 79–97)
Platelets: 235 10*3/uL (ref 150–450)
RBC: 3.03 x10E6/uL — ABNORMAL LOW (ref 3.77–5.28)
RDW: 15 % (ref 11.7–15.4)
WBC: 6.5 10*3/uL (ref 3.4–10.8)

## 2022-05-19 LAB — RETICULOCYTES
Immature Retic Fract: 31.1 % — ABNORMAL HIGH (ref 2.3–15.9)
RBC.: 3 MIL/uL — ABNORMAL LOW (ref 3.87–5.11)
Retic Count, Absolute: 90 10*3/uL (ref 19.0–186.0)
Retic Ct Pct: 3 % (ref 0.4–3.1)

## 2022-05-19 LAB — IRON AND TIBC
Iron: 34 ug/dL (ref 28–170)
Saturation Ratios: 6 % — ABNORMAL LOW (ref 10.4–31.8)
TIBC: 598 ug/dL — ABNORMAL HIGH (ref 250–450)
UIBC: 564 ug/dL

## 2022-05-19 LAB — LIPID PANEL
Chol/HDL Ratio: 2.5 ratio (ref 0.0–4.4)
Cholesterol, Total: 110 mg/dL (ref 100–199)
HDL: 44 mg/dL (ref >39)
LDL Chol Calc (NIH): 33 mg/dL (ref 0–99)
Triglycerides: 208 mg/dL — ABNORMAL HIGH (ref 0–149)
VLDL Cholesterol Cal: 33 mg/dL (ref 5–40)

## 2022-05-19 LAB — BRAIN NATRIURETIC PEPTIDE: BNP: 102.2 pg/mL — ABNORMAL HIGH (ref 0.0–100.0)

## 2022-05-19 LAB — HEMOGLOBIN A1C
Est. average glucose Bld gHb Est-mCnc: 154 mg/dL
Hgb A1c MFr Bld: 7 % — ABNORMAL HIGH (ref 4.8–5.6)

## 2022-05-19 LAB — GLUCOSE, CAPILLARY
Glucose-Capillary: 196 mg/dL — ABNORMAL HIGH (ref 70–99)
Glucose-Capillary: 223 mg/dL — ABNORMAL HIGH (ref 70–99)

## 2022-05-19 LAB — FOLATE: Folate: 19.8 ng/mL (ref >5.9)

## 2022-05-19 LAB — FERRITIN: Ferritin: 7 ng/mL — ABNORMAL LOW (ref 11–307)

## 2022-05-19 LAB — VITAMIN B12: Vitamin B-12: 1179 pg/mL — ABNORMAL HIGH (ref 180–914)

## 2022-05-19 LAB — MAGNESIUM: Magnesium: 1.8 mg/dL (ref 1.6–2.3)

## 2022-05-19 MED ORDER — LORAZEPAM 1 MG PO TABS
1.0000 mg | ORAL_TABLET | Freq: Every evening | ORAL | Status: DC | PRN
  Administered 2022-05-19 – 2022-05-21 (×3): 1 mg via ORAL
  Filled 2022-05-19 (×3): qty 1

## 2022-05-19 MED ORDER — INSULIN ASPART 100 UNIT/ML IJ SOLN
0.0000 [IU] | Freq: Three times a day (TID) | INTRAMUSCULAR | Status: DC
  Administered 2022-05-19: 3 [IU] via SUBCUTANEOUS
  Administered 2022-05-20: 2 [IU] via SUBCUTANEOUS
  Administered 2022-05-20: 5 [IU] via SUBCUTANEOUS
  Administered 2022-05-20: 3 [IU] via SUBCUTANEOUS
  Administered 2022-05-21: 5 [IU] via SUBCUTANEOUS
  Administered 2022-05-21 (×2): 2 [IU] via SUBCUTANEOUS
  Administered 2022-05-22: 3 [IU] via SUBCUTANEOUS

## 2022-05-19 MED ORDER — ROSUVASTATIN CALCIUM 5 MG PO TABS
10.0000 mg | ORAL_TABLET | Freq: Every day | ORAL | Status: DC
  Administered 2022-05-19 – 2022-05-21 (×3): 10 mg via ORAL
  Filled 2022-05-19 (×3): qty 2

## 2022-05-19 MED ORDER — NITROGLYCERIN 0.4 MG SL SUBL: 0.4000 mg | SUBLINGUAL_TABLET | SUBLINGUAL | Status: DC | PRN

## 2022-05-19 MED ORDER — LATANOPROST 0.005 % OP SOLN
1.0000 [drp] | Freq: Every day | OPHTHALMIC | Status: DC
  Administered 2022-05-19 – 2022-05-21 (×3): 1 [drp] via OPHTHALMIC
  Filled 2022-05-19: qty 2.5

## 2022-05-19 MED ORDER — DILTIAZEM HCL ER COATED BEADS 180 MG PO CP24
180.0000 mg | ORAL_CAPSULE | Freq: Every day | ORAL | Status: DC
  Administered 2022-05-20 – 2022-05-22 (×3): 180 mg via ORAL
  Filled 2022-05-19 (×3): qty 1

## 2022-05-19 MED ORDER — ACETAMINOPHEN 325 MG PO TABS
650.0000 mg | ORAL_TABLET | ORAL | Status: DC | PRN
  Administered 2022-05-20 – 2022-05-21 (×2): 650 mg via ORAL
  Filled 2022-05-19 (×2): qty 2

## 2022-05-19 MED ORDER — ROPINIROLE HCL 1 MG PO TABS
1.0000 mg | ORAL_TABLET | Freq: Two times a day (BID) | ORAL | Status: DC
  Administered 2022-05-19 – 2022-05-22 (×6): 1 mg via ORAL
  Filled 2022-05-19 (×6): qty 1

## 2022-05-19 MED ORDER — PAROXETINE HCL 10 MG PO TABS
10.0000 mg | ORAL_TABLET | Freq: Every day | ORAL | Status: DC
  Administered 2022-05-20 – 2022-05-22 (×3): 10 mg via ORAL
  Filled 2022-05-19 (×3): qty 1

## 2022-05-19 MED ORDER — TRAZODONE HCL 50 MG PO TABS
150.0000 mg | ORAL_TABLET | Freq: Every day | ORAL | Status: DC
  Administered 2022-05-19 – 2022-05-21 (×3): 150 mg via ORAL
  Filled 2022-05-19 (×3): qty 1

## 2022-05-19 MED ORDER — METOPROLOL SUCCINATE ER 100 MG PO TB24
100.0000 mg | ORAL_TABLET | Freq: Two times a day (BID) | ORAL | Status: DC
  Administered 2022-05-19 – 2022-05-22 (×6): 100 mg via ORAL
  Filled 2022-05-19 (×6): qty 1

## 2022-05-19 MED ORDER — GABAPENTIN 100 MG PO CAPS
100.0000 mg | ORAL_CAPSULE | Freq: Three times a day (TID) | ORAL | Status: DC
  Administered 2022-05-19 – 2022-05-22 (×8): 100 mg via ORAL
  Filled 2022-05-19 (×8): qty 1

## 2022-05-19 MED ORDER — ONDANSETRON HCL 4 MG/2ML IJ SOLN: 4.0000 mg | Freq: Four times a day (QID) | INTRAMUSCULAR | Status: DC | PRN

## 2022-05-19 NOTE — H&P (Addendum)
Cardiology Admission History and Physical   Patient ID: [name] MRN: [id number]; DOB: 04-08-1951   Admission date: 05/19/2022  PCP:  Hoyt Koch, MD   [place] Prov[facility] Cardiologist:  Sanda Klein, MD    Chief Complaint:  Anemia/GIB  Patient Profile:   [name] is a 71 y.o. female with past medical history of CAD s/p stent to PLV B '09, permanent atrial fibrillation, diabetes, hypertension, pericardiocentesis for hemothorax with tamponade '20 and GI bleed who is being seen 05/19/2022 for the evaluation of anemia/GI bleed while on anticoagulation.  History of Present Illness:   [name] is a 71 year old female with past medical history noted above.  She has been followed by Dr. Sallyanne Kuster as an outpatient as well as in the CHF clinic.  She has had a series of normal nuclear stress test with the most recent being 05/2021.  She was hospitalized in 2022 with a lower GI bleed with bright red blood per rectum and hemoglobin was as low as 6.  Underwent work-up with colonoscopy, upper endoscopy and eventually capsule study that did not show clear source of bleeding only a couple of AVMs in the colon, one in the cecum that appeared to be friable.  This was treated with electrocautery.  She has been back on anticoagulation since that time without overt bleeding symptoms.  She had a total hip replacement 09/2021 after an acetabular fracture on the left side which was treated conservatively.  Notes indicate she's really had a functional decline since that time and had not recovered as well as she hoped she would.  Had had problems with swelling and weight gain immediately after surgery.  This improved with diuretics.  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  She has had continued issues with climbing stairs and becomes dyspneic with walking outside.  She continued to sleep in a recliner and noted  occasional chest tightness with exertion.  Office visit 02/2022 she was noted to be in rate controlled atrial fibrillation with continuation of anticoagulation as well as iron supplements.  It was recommended that she consider a Watchman device but was skeptical.  Most recent visit in the office 10/6 with Dr. Sallyanne Kuster at which time she reported worsening weakness and fatigue as well as dyspnea and abdominal distention which had not improved despite the fact that she had increase her torsemide to 40 mg daily for the week prior (double her previous dose).  She reported her weight being up by several pounds.  She had been having problems with back pain and was taking Celebrex twice a day with plans to have back surgery in the future.   Given her symptoms outpatient labs were obtained and notable for sodium 128, creatinine of 1.4, WBC 6.5, hemoglobin 7.9 hematocrit 25.  She was called by Dr. Sallyanne Kuster with recommendations for direct admission to the hospital for further evaluation and instructed to hold her Xarelto.   Past Medical History:  Diagnosis Date   Anemia, unspecified    Arthritis    Asthma    Atrial fibrillation with RVR ([place])    a. on Xarelto   Bell's palsy    Facial nerve decompression in 2001   CHF (congestive heart failure) (HCC)    Chronic low back pain    COPD with asthma    Coronary artery disease    Myoview 04/12/11 was entirely normal. ECHO 02/26/08 showed only minor abnormalities. Stenting 05/26/08  of her posterolateral branch to the left circumflex coronary artery. Used a 2.5x90m Taxus Monorail stent.myoview 2014 was without ischemia   Diabetes mellitus    Type 2   Early cataracts, bilateral    Fatty liver    GERD (gastroesophageal reflux disease)    Glaucoma    Glaucoma    Goiter    Heart murmur    History of nuclear stress test 2012; 2014   lexiscan; normal pattern of perfusion; normal, low risk scan    Hyperlipidemia    Hypertension    Panic disorder    Pneumonia  2008   Polycystic ovary disease    Hysterectomy in 1982 for this   Shortness of breath dyspnea    ECHO 02/26/08 showed only minor abnormalities   Spinal stenosis     Past Surgical History:  Procedure Laterality Date   ABDOMINAL HYSTERECTOMY  1982   & BSO; for polycystic ovary disease   CARDIOVERSION N/A 12/17/2013   Procedure: CARDIOVERSION;  Surgeon: KPixie Casino MD;  [place] [facility]  Service: Cardiovascular;  Laterality: N/A;   CARDIOVERSION N/A 09/30/2015   Procedure: CARDIOVERSION;  Surgeon: KDorothy Spark MD;  [place] [facility]  Service: Cardiovascular;  Laterality: N/A;   CARDIOVERSION N/A 06/22/2016   Procedure: CARDIOVERSION;  Surgeon: TSkeet Latch MD;  [place] [facility]  Service: Cardiovascular;  Laterality: N/A;   CENTRAL LINE INSERTION  01/13/2019   Procedure: CENTRAL LINE INSERTION;  Surgeon: BJolaine Artist MD;  [facility];  Service: Cardiovascular;;   COLONOSCOPY     last 2009; Dr BCristina Gong due 2019   COLONOSCOPY WITH PROPOFOL N/A 01/23/2018   Procedure: COLONOSCOPY WITH PROPOFOL;  Surgeon: BRonald Lobo MD;  [place]  Service: Endoscopy;  Laterality: N/A;   COLONOSCOPY WITH PROPOFOL N/A 11/04/2020   Procedure: COLONOSCOPY WITH PROPOFOL;  Surgeon: BRonald Lobo MD;  [place]  Service: Endoscopy;  Laterality: N/A;   CORONARY ANGIOPLASTY  05/26/2008   Stenting of her posterolateral branch to the left circumflex coronary artery. Used a 2.5x189mTaxus Monorail stent.   ESOPHAGOGASTRODUODENOSCOPY (EGD) WITH PROPOFOL N/A 01/23/2018   Procedure: ESOPHAGOGASTRODUODENOSCOPY (EGD) WITH PROPOFOL;  Surgeon: BuRonald LoboMD;  [place] [facility] Service: Endoscopy;  Laterality: N/A;   ESOPHAGOGASTRODUODENOSCOPY (EGD) WITH PROPOFOL N/A 11/04/2020   Procedure: ESOPHAGOGASTRODUODENOSCOPY (EGD) WITH PROPOFOL;  Surgeon: BuRonald LoboMD;  [place] [facility] Service: Endoscopy;  Laterality:  N/A;   FACIAL NERVE DECOMPRESSION  2001/2002   bells palsy    GIVENS CAPSULE STUDY N/A 11/04/2020   Procedure: GIVENS CAPSULE STUDY;  Surgeon: BuRonald LoboMD;  [place] [facility] [facility] Service: Endoscopy;  Laterality: N/A;   HOT HEMOSTASIS N/A 11/04/2020   Procedure: HOT HEMOSTASIS (ARGON PLASMA COAGULATION/BICAP);  Surgeon: BuRonald LoboMD;  [facility]  Service: Endoscopy;  Laterality: N/A;   LAPAROSCOPIC CHOLECYSTECTOMY  06/15/2011    Dr InDalbert Batman PERICARDIOCENTESIS N/A 01/13/2019   Procedure: PERICARDIOCENTESIS;  Surgeon: McBurnell BlanksMD;  [facility];  Service: Cardiovascular;  Laterality: N/A;   POLYPECTOMY  11/04/2020   Procedure: POLYPECTOMY;  Surgeon: BuRonald LoboMD;  [place] [facility] Service: Endoscopy;;   RIGHT AND LEFT HEART CATH N/A 01/13/2019   Procedure: RIGHT AND LEFT HEART CATH;  Surgeon: BeJolaine ArtistMD;  [facility];  Service: Cardiovascular;  Laterality: N/A;   TEE WITHOUT CARDIOVERSION N/A 12/17/2013   Procedure: TRANSESOPHAGEAL ECHOCARDIOGRAM (TEE);  Surgeon: KePixie CasinoMD;  [facility];  Service: Cardiovascular;  Laterality: N/A;  trish/ja   TOTAL HIP ARTHROPLASTY Left 09/25/2021   Procedure: LEFT TOTAL HIP ARTHROPLASTY ANTERIOR APPROACH;  Surgeon: Frederik Pear, MD;  [facility];  Service: Orthopedics;  Laterality: Left;   TRANSTHORACIC ECHOCARDIOGRAM  0/03/6760   LV systolic function normal with mild conc LVH; LA mildly dilated; trace MR/TR   UPPER GI ENDOSCOPY  2009   negative     Medications Prior to Admission: Prior to Admission medications   Medication Sig Start Date End Date Taking? Authorizing Provider  Accu-Chek Softclix Lancets lancets Use as instructed 11/10/21   Hoyt Koch, MD  ascorbic acid (VITAMIN C) 1000 MG tablet Take 1,000 mg by mouth daily.    [provider]  celecoxib (CELEBREX) 100 MG capsule Take 1 capsule (100 mg total) by  mouth 2 (two) times daily. 01/19/22   Hoyt Koch, MD  colestipol (COLESTID) 1 g tablet Take 0.5 g by mouth 2 (two) times daily. 02/10/20   [provider]  diltiazem (CARDIZEM CD) 180 MG 24 hr capsule TAKE ONE CAPSULE ONCE DAILY 04/02/22   Lorretta Harp, MD  famotidine (PEPCID) 20 MG tablet Take 20 mg by mouth 2 (two) times daily. 02/10/20   [provider]  gabapentin (NEURONTIN) 100 MG capsule TAKE ONE CAPSULE BY MOUTH THREE TIMES DAILY 11/28/21   Hoyt Koch, MD  glucose blood (FREESTYLE LITE) test strip Use as instructed 11/10/21   Hoyt Koch, MD  guaiFENesin (MUCINEX) 600 MG 12 hr tablet Take 600 mg by mouth 2 (two) times daily.    [provider]  HYDROcodone-acetaminophen (NORCO/VICODIN) 5-325 MG tablet Take 1 tablet by mouth 2 (two) times daily as needed for moderate pain. 04/03/22   Hoyt Koch, MD  INVOKANA 100 MG TABS tablet TAKE ONE TABLET BY MOUTH DAILY BEFORE BREAKFAST.. 05/10/22   Hoyt Koch, MD  latanoprost (XALATAN) 0.005 % ophthalmic solution Place 1 drop into both eyes at bedtime. 02/07/21   [provider]  lisinopril (ZESTRIL) 20 MG tablet TAKE ONE TABLET BY MOUTH DAILY 01/30/22   Hoyt Koch, MD  LORazepam (ATIVAN) 0.5 MG tablet 1 po bid prn 03/20/22   Plotnikov, Evie Lacks, MD  metoprolol succinate (TOPROL-XL) 100 MG 24 hr tablet TAKE ONE TABLET BY MOUTH TWICE DAILY 05/18/22   [place] Jaquith, Dani Gobble, MD  metoprolol tartrate (LOPRESSOR) 25 MG tablet Take 1 tablet (25 mg total) by mouth daily as needed (if HR increases). 07/27/21   Jamacia Jester, MD  Multiple Minerals-Vitamins (CALCIUM CITRATE PLUS/MAGNESIUM PO) Take 400 mg by mouth daily.    [provider]  nystatin (MYCOSTATIN/NYSTOP) powder Apply 1 application topically once a week. 06/20/21   Hoyt Koch, MD  nystatin ointment (MYCOSTATIN) Apply 1 application topically 2 (two) times daily as needed (IRRITATION). Patient  taking differently: Apply 1 application  topically daily as needed (IRRITATION). 06/20/21   Hoyt Koch, MD  PARoxetine (PAXIL) 10 MG tablet TAKE 2 AND 1/2 TABLETS BY MOUTH ONCE DAILY 02/12/22   Hoyt Koch, MD  potassium chloride (MICRO-K) 10 MEQ CR capsule Take 1 capsule (10 mEq total) by mouth daily. 03/08/22   Kadan Millstein, MD  rivaroxaban (XARELTO) 10 MG TABS tablet Take 1 tablet (10 mg total) by mouth daily. 09/25/21   Leighton Parody, PA-C  rOPINIRole (REQUIP XL) 2 MG 24 hr tablet Take 1 tablet (2 mg total) by mouth at bedtime. 11/27/21   Dohmeier, Asencion Partridge, MD  rosuvastatin (CRESTOR) 10 MG tablet TAKE ONE TABLET AT BEDTIME. 03/15/22   Monterrio Gerst, MD  senna (SENOKOT) 8.6 MG TABS Take 1 tablet by mouth at bedtime as needed for mild constipation.    [provider]  sitaGLIPtin-metformin (JANUMET) 50-1000 MG tablet Take 1 tablet by mouth 2 (two) times daily with a meal. 03/09/22   Hoyt Koch, MD  torsemide (DEMADEX) 20 MG tablet Take 1 tablet (20 mg total) by mouth daily. 10/18/21   Binnie Rail, MD  traZODone (DESYREL) 100 MG tablet Up to 200 mg at bedtime for sleep Patient taking differently: Take 150 mg by mouth at bedtime. 06/15/21   Dohmeier, Asencion Partridge, MD  triamcinolone cream (KENALOG) 0.1 % Apply 1 Application topically 2 (two) times daily. 04/03/22   Hoyt Koch, MD  umeclidinium-vilanterol [facility] ELLIPTA) 62.5-25 MCG/ACT AEPB Inhale 1 puff into the lungs daily. 12/25/21   Deneise Lever, MD     Allergies:    Allergies  Allergen Reactions   Benadryl [Diphenhydramine Hcl] Other (See Comments)    Restless leg   Clopidogrel Bisulfate     Other reaction(s): stomach upset   Ioversol Other (See Comments)    Unknown   Benadryl [Diphenhydramine] Anxiety   Iodinated Contrast Media Palpitations and Rash    Rapid heart rate, hot Other reaction(s): rapid heartbeat  Other reaction(s): flushing    Social History:   Social History    Socioeconomic History   Marital status: Married    Spouse name: Not on file   Number of children: 2   Years of education: master's   Highest education level: Not on file  Occupational History   Occupation: Electrical engineer    Employer: [employer]  Tobacco Use   Smoking status: Former    Packs/day: 1.50    Years: 30.00    Total pack years: 45.00    Types: Cigarettes    Quit date: 08/13/1993    Years since quitting: 28.7   Smokeless tobacco: Never  Vaping Use   Vaping Use: Never used  Substance and Sexual Activity   Alcohol use: No   Drug use: No   Sexual activity: Not on file  Other Topics Concern   Not on file  Social History Narrative   ** Merged History Encounter **       Social Determinants of Health   Financial Resource Strain: Not on file  Food Insecurity: Not on file  Transportation Needs: Not on file  Physical Activity: Not on file  Stress: Not on file  Social Connections: Not on file  Intimate Partner Violence: Not on file    Family History:   The patient's family history includes Cancer in her brother and sister; Colon cancer (age of onset: 31) in her brother; Colon polyps in her sister; Emphysema (age of onset: 56) in her mother; Gout in her brother; Heart attack (age of onset: 50) in her father; Hyperlipidemia in her brother; Hypertension in her brother; Pneumonia in her maternal grandmother; Ulcers in her mother. There is no history of Diabetes or Stroke.    ROS:  Please see the history of present illness.  All other ROS reviewed and negative.     Physical Exam/Data:  There were no vitals filed for this visit. No intake or output data in the 24 hours ending 05/19/22 1627    05/18/2022    3:44 PM 04/03/2022    3:27 PM 02/22/2022    3:53 PM  Last 3 Weights  Weight (lbs) 187 lb 185 lb 185 lb 6.4 oz  Weight (kg) 84.823 kg 83.915 kg 84.097 kg     There is no height or weight on file to calculate BMI.  General:  Older female, no  distress HEENT: normal Neck: no JVD Vascular: No carotid bruits; Distal pulses 2+ bilaterally   Cardiac:  normal S1, S2; Irreg Irreg; no murmur  Lungs:  clear to auscultation bilaterally, no wheezing, rhonchi or rales  Abd: soft, nontender, no hepatomegaly  Ext: no edema Musculoskeletal:  No deformities, BUE and BLE strength normal and equal Skin: warm and dry  Neuro:  CNs 2-12 intact, no focal abnormalities noted Psych:  Normal affect   Relevant CV Studies:  Echo: 11/2019  IMPRESSIONS     1. Abnormal septal motion and hypokinesis as well as inferior basal  hypokinesis EF similar to echo done 05/2019 or slightly better . Left  ventricular ejection fraction, by estimation, is 50 to 55%. The left  ventricle has low normal function. The left  ventricle demonstrates regional wall motion abnormalities (see scoring  diagram/findings for description). Left ventricular diastolic parameters  are indeterminate.   2. Right ventricular systolic function is normal. The right ventricular  size is normal.   3. Left atrial size was moderately dilated.   4. Right atrial size was moderately dilated.   5. The mitral valve is degenerative. Trivial mitral valve regurgitation.  No evidence of mitral stenosis.   6. The aortic valve is tricuspid. Aortic valve regurgitation is trivial.  Mild to moderate aortic valve sclerosis/calcification is present, without  any evidence of aortic stenosis.   7. The inferior vena cava is normal in size with greater than 50%  respiratory variability, suggesting right atrial pressure of 3 mmHg.   Comparison(s): 05/19/19 EF 45-50%.   FINDINGS   Left Ventricle: Abnormal septal motion and hypokinesis as well as  inferior basal hypokinesis EF similar to echo done 05/2019 or slightly  better. Left ventricular ejection fraction, by estimation, is 50 to 55%.  The left ventricle has low normal function.   The left ventricle demonstrates regional wall motion abnormalities.   Definity contrast agent was given IV to delineate the left ventricular  endocardial borders. The left ventricular internal cavity size was normal  in size. There is no left ventricular  hypertrophy. Left ventricular diastolic parameters are indeterminate.   Right Ventricle: The right ventricular size is normal. No increase in  right ventricular wall thickness. Right ventricular systolic function is  normal.   Left Atrium: Left atrial size was moderately dilated.   Right Atrium: Right atrial size was moderately dilated.   Pericardium: There is no evidence of pericardial effusion.   Mitral Valve: The mitral valve is degenerative in appearance. There is  moderate thickening of the mitral valve leaflet(s). There is moderate  calcification of the mitral valve leaflet(s). Normal mobility of the  mitral valve leaflets. Moderate mitral  annular calcification. Trivial mitral valve regurgitation. No evidence of  mitral valve stenosis.   Tricuspid Valve: The tricuspid valve is normal in structure. Tricuspid  valve regurgitation is trivial. No evidence of tricuspid stenosis.   Aortic Valve: The aortic valve is tricuspid. Aortic valve regurgitation is  trivial. Mild to moderate aortic valve sclerosis/calcification is present,  without any evidence of aortic stenosis.   Pulmonic Valve: The pulmonic valve was normal in structure. Pulmonic valve  regurgitation is not visualized. No evidence of pulmonic stenosis.   Aorta: The aortic root is normal in size and  structure.   Venous: The inferior vena cava is normal in size with greater than 50%  respiratory variability, suggesting right atrial pressure of 3 mmHg.   IAS/Shunts: No atrial level shunt detected by color flow Doppler.   Nuclear stress test 06/12/2021   Findings are consistent with no prior ischemia and no prior myocardial infarction. The study is low risk.   No ST deviation was noted.   LV perfusion is normal. There is no evidence  of ischemia. There is no evidence of infarction.   End diastolic cavity size is normal. End systolic cavity size is normal.   Normal perfusion EF may not be accurate due to afib estimated at 51% no RWMAls noted    Laboratory Data:  High Sensitivity Troponin:  No results for input(s): "TROPONINIHS" in the last 720 hours.    Chemistry Recent Labs  Lab 05/18/22 1623  NA 128*  K 5.2  CL 93*  CO2 19*  GLUCOSE 198*  BUN 25  CREATININE 1.44*  CALCIUM 8.9  MG 1.8    No results for input(s): "PROT", "ALBUMIN", "AST", "ALT", "ALKPHOS", "BILITOT" in the last 168 hours. Lipids  Recent Labs  Lab 05/18/22 1623  CHOL 110  TRIG 208*  HDL 44  LABVLDL 33  LDLCALC 33  CHOLHDL 2.5   Hematology Recent Labs  Lab 05/18/22 1623  WBC 6.5  RBC 3.03*  HGB 7.9*  HCT 25.2*  MCV 83  MCH 26.1*  MCHC 31.3*  RDW 15.0  PLT 235   Thyroid No results for input(s): "TSH", "FREET4" in the last 168 hours. BNP Recent Labs  Lab 05/18/22 1623  BNP 102.2*    DDimer No results for input(s): "DDIMER" in the last 168 hours.   Radiology/Studies:  No results found.   Assessment and Plan:   [name] is a 71 y.o. female with past medical history of CAD s/p stent to PLV B '09, permanent atrial fibrillation, diabetes, hypertension, pericardiocentesis for hemothorax with tamponade '20 and GI bleed who is being seen 05/19/2022 for the evaluation of anemia/GI bleed while on anticoagulation.  Anemia Suspected GI bleed Hx of GIB -- CBC on labs 10/6 with decline in Hgb 7.9. She does report intermittent episodes of hematochezia over the past several days. Has been using Celebrex for back pain.  Was admitted back 2022 with BRBPR with AVMs in the colon and one in the cecum treated with electrocautery. Will need to revisit consideration of Watchman.  -- holding Xarelto -- recheck CBC, anemia panel -- GI consult  Permanent atrial fibrillation -- rates have been controlled -- continue Toprol XL '100mg'$   daily, Diltiazem '180mg'$  daily  HFpEF -- BNP 102, done from 137 back 02/2022 -- hold torsemide '20mg'$  daily for now -- check echo  Hypertension -- continue Toprol, Diltiazem  -- hold lisinopril   Hyperlipidemia -- crestor '10mg'$  daily  Diabetes -- Hgb A1c 7.0 -- hold home janumet -- SSI while inpatient   Risk Assessment/Risk Scores:    New York Heart Association (NYHA) Functional Class NYHA Class II  CHA2DS2-VASc Score = 6  This indicates a 9.7% annual risk of stroke. The patient's score is based upon: CHF History: 1 HTN History: 1 Diabetes History: 1 Stroke History: 0 Vascular Disease History: 1 Age Score: 1 Gender Score: 1   Severity of Illness: The appropriate patient status for this patient is INPATIENT. Inpatient status is judged to be reasonable and necessary in order to provide the required intensity of service to ensure the patient's  safety. The patient's presenting symptoms, physical exam findings, and initial radiographic and laboratory data in the context of their chronic comorbidities is felt to place them at high risk for further clinical deterioration. Furthermore, it is not anticipated that the patient will be medically stable for discharge from the hospital within 2 midnights of admission.   * I certify that at the point of admission it is my clinical judgment that the patient will require inpatient hospital care spanning beyond 2 midnights from the point of admission due to high intensity of service, high risk for further deterioration and high frequency of surveillance required.*   For questions or updates, please contact [place] Please consult [email or web address] for contact info under     Signed, Reino Bellis, NP  05/19/2022 4:27 PM    I have seen and examined the patient along with Reino Bellis, NP .  I have reviewed the chart, notes and new data.  I agree with PA/NP's note.  Key new complaints: fatigue and dyspnea, no edema Key  examination changes: weight is up, but no overt hypervolemia on exam Key new findings / data: BNP is lowest recorded, Hgb 7.9, creat up at 1.44, sodium low at 128.  PLAN: Symptomatic anemia. Recheck labs, but will probably need a transfusion. Check iron studies first. Hold diuretics. Strongly recommend Watchman device implantation after we have excluded active bleeding.   Sanda Klein, MD, [place] [phone] 05/19/2022, 5:11 PM

## 2022-05-19 NOTE — Plan of Care (Signed)

## 2022-05-19 NOTE — Progress Notes (Addendum)
Patient wife at bedside in cardiac chair patient in wheel chair education to them both that patient should be in cardiac chair not wc patient refusing to get in bed and refusing to take off home clothing. Patient states her wife will sleep in bed education on only patient allowed to sleep in bed for safety reasons and patient wife states " I don't have the correct bracelet on so nothing should happen" Im sure education did not go well. Both patient and patient wife refusing to be NPO at [facility] because no doctor told them why or whats happening EGD and HBG explained but they both are refusing they want dr paged education given on who is working at this time they said they will call his office for on call education on using that for home emergencies not in hospital explanations. Patient states she plans to be here 3 days anyway's so if they don't do a procedure tomorrow because she ate its ok with her.

## 2022-05-19 NOTE — Plan of Care (Signed)
  Problem: Education: Goal: Knowledge of General Education information will improve Description: Including pain rating scale, medication(s)/side effects and non-pharmacologic comfort measures Outcome: Not Progressing Note: Refusing npo   Problem: Clinical Measurements: Goal: Ability to maintain clinical measurements within normal limits will improve Outcome: Progressing Goal: Will remain free from infection Outcome: Progressing Goal: Diagnostic test results will improve Outcome: Progressing Goal: Respiratory complications will improve Outcome: Progressing Goal: Cardiovascular complication will be avoided Outcome: Progressing   Problem: Activity: Goal: Risk for activity intolerance will decrease Outcome: Progressing   Problem: Nutrition: Goal: Adequate nutrition will be maintained Outcome: Progressing   Problem: Elimination: Goal: Will not experience complications related to urinary retention Outcome: Progressing   Problem: Pain Managment: Goal: General experience of comfort will improve Outcome: Progressing   Problem: Safety: Goal: Ability to remain free from injury will improve Outcome: Progressing   Problem: Skin Integrity: Goal: Risk for impaired skin integrity will decrease Outcome: Progressing   Problem: Education: Goal: Knowledge of General Education information will improve Description: Including pain rating scale, medication(s)/side effects and non-pharmacologic comfort measures Outcome: Progressing   Problem: Health Behavior/Discharge Planning: Goal: Ability to manage health-related needs will improve Outcome: Progressing   Problem: Clinical Measurements: Goal: Ability to maintain clinical measurements within normal limits will improve Outcome: Progressing Goal: Will remain free from infection Outcome: Progressing Goal: Diagnostic test results will improve Outcome: Progressing Goal: Respiratory complications will improve Outcome: Progressing Goal:  Cardiovascular complication will be avoided Outcome: Progressing   Problem: Nutrition: Goal: Adequate nutrition will be maintained Outcome: Progressing   Problem: Activity: Goal: Risk for activity intolerance will decrease Outcome: Progressing   Problem: Coping: Goal: Level of anxiety will decrease Outcome: Progressing   Problem: Elimination: Goal: Will not experience complications related to bowel motility Outcome: Progressing Goal: Will not experience complications related to urinary retention Outcome: Progressing   Problem: Pain Managment: Goal: General experience of comfort will improve Outcome: Progressing   Problem: Safety: Goal: Ability to remain free from injury will improve Outcome: Progressing   Problem: Skin Integrity: Goal: Risk for impaired skin integrity will decrease Outcome: Progressing   Problem: Education: Goal: Ability to describe self-care measures that may prevent or decrease complications (Diabetes Survival Skills Education) will improve Outcome: Not Progressing   Problem: Coping: Goal: Ability to adjust to condition or change in health will improve Outcome: Progressing   Problem: Fluid Volume: Goal: Ability to maintain a balanced intake and output will improve Outcome: Progressing   Problem: Health Behavior/Discharge Planning: Goal: Ability to identify and utilize available resources and services will improve Outcome: Progressing   Problem: Metabolic: Goal: Ability to maintain appropriate glucose levels will improve Outcome: Progressing   Problem: Nutritional: Goal: Maintenance of adequate nutrition will improve Outcome: Progressing Goal: Progress toward achieving an optimal weight will improve Outcome: Progressing   Problem: Tissue Perfusion: Goal: Adequacy of tissue perfusion will improve Outcome: Progressing   Problem: Education: Goal: Understanding of cardiac disease, CV risk reduction, and recovery process will  improve Outcome: Progressing Goal: Individualized Educational Video(s) Outcome: Progressing   Problem: Activity: Goal: Ability to tolerate increased activity will improve Outcome: Progressing   Problem: Cardiac: Goal: Ability to achieve and maintain adequate cardiovascular perfusion will improve Outcome: Progressing

## 2022-05-20 ENCOUNTER — Encounter (HOSPITAL_COMMUNITY): Payer: Self-pay | Admitting: Cardiovascular Disease

## 2022-05-20 ENCOUNTER — Other Ambulatory Visit (HOSPITAL_COMMUNITY): Payer: HMO

## 2022-05-20 ENCOUNTER — Inpatient Hospital Stay (HOSPITAL_COMMUNITY): Payer: HMO

## 2022-05-20 DIAGNOSIS — N179 Acute kidney failure, unspecified: Secondary | ICD-10-CM | POA: Diagnosis not present

## 2022-05-20 DIAGNOSIS — D6869 Other thrombophilia: Secondary | ICD-10-CM

## 2022-05-20 DIAGNOSIS — I5032 Chronic diastolic (congestive) heart failure: Secondary | ICD-10-CM | POA: Diagnosis not present

## 2022-05-20 DIAGNOSIS — R0609 Other forms of dyspnea: Secondary | ICD-10-CM | POA: Diagnosis not present

## 2022-05-20 DIAGNOSIS — E871 Hypo-osmolality and hyponatremia: Secondary | ICD-10-CM | POA: Diagnosis not present

## 2022-05-20 DIAGNOSIS — I480 Paroxysmal atrial fibrillation: Secondary | ICD-10-CM

## 2022-05-20 LAB — GLUCOSE, CAPILLARY
Glucose-Capillary: 170 mg/dL — ABNORMAL HIGH (ref 70–99)
Glucose-Capillary: 226 mg/dL — ABNORMAL HIGH (ref 70–99)
Glucose-Capillary: 138 mg/dL — ABNORMAL HIGH (ref 70–99)
Glucose-Capillary: 178 mg/dL — ABNORMAL HIGH (ref 70–99)

## 2022-05-20 LAB — BASIC METABOLIC PANEL
Anion gap: 10 (ref 5–15)
BUN: 24 mg/dL — ABNORMAL HIGH (ref 8–23)
CO2: 22 mmol/L (ref 22–32)
Calcium: 8.8 mg/dL — ABNORMAL LOW (ref 8.9–10.3)
Chloride: 90 mmol/L — ABNORMAL LOW (ref 98–111)
Creatinine, Ser: 1.56 mg/dL — ABNORMAL HIGH (ref 0.44–1.00)
GFR, Estimated: 35 mL/min — ABNORMAL LOW (ref >60)
Glucose, Bld: 164 mg/dL — ABNORMAL HIGH (ref 70–99)
Potassium: 4.9 mmol/L (ref 3.5–5.1)
Sodium: 122 mmol/L — ABNORMAL LOW (ref 135–145)

## 2022-05-20 LAB — CBC
HCT: 25 % — ABNORMAL LOW (ref 36.0–46.0)
Hemoglobin: 8.1 g/dL — ABNORMAL LOW (ref 12.0–15.0)
MCH: 26 pg (ref 26.0–34.0)
MCHC: 32.4 g/dL (ref 30.0–36.0)
MCV: 80.4 fL (ref 80.0–100.0)
Platelets: 257 10*3/uL (ref 150–400)
RBC: 3.11 MIL/uL — ABNORMAL LOW (ref 3.87–5.11)
RDW: 14.3 % (ref 11.5–15.5)
WBC: 8.6 10*3/uL (ref 4.0–10.5)
nRBC: 0 % (ref 0.0–0.2)

## 2022-05-20 LAB — ECHOCARDIOGRAM COMPLETE
Height: 62 in
S' Lateral: 2.6 cm
Weight: 3044.8 oz

## 2022-05-20 LAB — PREPARE RBC (CROSSMATCH)

## 2022-05-20 MED ORDER — SODIUM CHLORIDE 0.9 % IV SOLN
510.0000 mg | Freq: Once | INTRAVENOUS | Status: AC
  Administered 2022-05-20: 510 mg via INTRAVENOUS
  Filled 2022-05-20: qty 17

## 2022-05-20 MED ORDER — SODIUM CHLORIDE 0.9% IV SOLUTION: Freq: Once | INTRAVENOUS | Status: AC

## 2022-05-20 NOTE — TOC Initial Note (Signed)
Transition of Care [facility]) - Initial/Assessment Note    Patient Details  Name: [name] MRN: [id number] Date of Birth: 31-Mar-1951  Transition of Care [facility]) CM/SW Contact:    Bartholomew Crews, RN Phone Number: [phone] 05/20/2022, 11:49 AM  Clinical Narrative:                  Spoke with patient and wife, Edd Fabian, at the bedside to discuss post acute transition. PCP, pharmacy, and demographics verified in [place]. Edd Fabian is able to provide transportation to/from medical appointments. Discussed community resources for grocery delivery vs. private pay for home care services. Reviewed understanding medications and disease processes - patient reported doing daily weights and appropriate weight gains to report, and follows recommended fluid restriction. TOC to follow for transition needs.   Expected Discharge Plan: Home/Self Care Barriers to Discharge: Continued Medical Work up   Patient Goals and CMS Choice Patient states their goals for this hospitalization and ongoing recovery are:: return home CMS Medicare.gov Compare Post Acute Care list provided to:: Patient Choice offered to / list presented to : NA  Expected Discharge Plan and Services Expected Discharge Plan: Home/Self Care In-house Referral: NA Discharge Planning Services: CM Consult Post Acute Care Choice: NA Living arrangements for the past 2 months: Single Family Home                 DME Arranged: N/A DME Agency: NA       HH Arranged: NA HH Agency: NA        Prior Living Arrangements/Services Living arrangements for the past 2 months: Single Family Home Lives with:: Self, Spouse Patient language and need for interpreter reviewed:: Yes Do you feel safe going back to the place where you live?: Yes      Need for Family Participation in Patient Care: Yes (Comment) Care giver support system in place?: Yes (comment) Current home services: DME (rollator) Criminal Activity/Legal Involvement Pertinent to Current  Situation/Hospitalization: No - Comment as needed  Activities of Daily Living Home Assistive Devices/Equipment: Walker (specify type) ADL Screening (condition at time of admission) Patient's cognitive ability adequate to safely complete daily activities?: Yes Is the patient deaf or have difficulty hearing?: No Does the patient have difficulty seeing, even when wearing glasses/contacts?: No Does the patient have difficulty concentrating, remembering, or making decisions?: No Patient able to express need for assistance with ADLs?: Yes Does the patient have difficulty dressing or bathing?: No Independently performs ADLs?: Yes (appropriate for developmental age) Does the patient have difficulty walking or climbing stairs?: Yes Weakness of Legs: Both Weakness of Arms/Hands: None  Permission Sought/Granted Permission sought to share information with : Family Supports    Share Information with NAME: Edd Fabian     Permission granted to share info w Relationship: spouse  Permission granted to share info w Contact Information: [phone]  Emotional Assessment Appearance:: Appears stated age Attitude/Demeanor/Rapport: Engaged Affect (typically observed): Accepting Orientation: : Oriented to Self, Oriented to Place, Oriented to  Time, Oriented to Situation Alcohol / Substance Use: Not Applicable Psych Involvement: No (comment)  Admission diagnosis:  Congestive heart failure (HCC) [I50.9] Anemia [D64.9] Patient Active Problem List   Diagnosis Date Noted   Opioid dependence ([place]) 04/06/2022   Central obesity 04/03/2022   Morbid obesity ([place]) 04/03/2022   Spinal stenosis 04/03/2022   Congestive heart failure ([place]) 11/27/2021   Pincer nail deformity 10/30/2021   H/O total hip arthroplasty, left 09/25/2021   Osteoarthritis of left hip 09/20/2021   Rash 06/23/2021  Longstanding persistent atrial fibrillation ([place]) 06/15/2021   Closed fracture of left proximal humerus 03/03/2021   Left  acetabular fracture ([place]) 03/03/2021   Chronic diastolic CHF (congestive heart failure) ([place]) 03/03/2021   DM (diabetes mellitus), type 2 ([place]) 03/03/2021   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   Lumbar radiculitis 12/16/2020   Weakness of both lower extremities 09/23/2020   Abnormal feces 09/19/2020   Family history of malignant neoplasm of gastrointestinal tract 09/19/2020   Chronic respiratory failure with hypoxia ([place]) 09/01/2020   Nocturnal hypoxemia 04/05/2020   Deep vein thrombosis (DVT) of calf muscle vein of left lower extremity ([place]) 11/02/2019   Obesity hypoventilation syndrome ([place]) 11/02/2019   Coagulopathy ([place]) 11/02/2019   Coronary artery disease involving native coronary artery of native heart with angina pectoris ([place]) 11/02/2019   Bradycardia 01/12/2019   Pericardial effusion 01/12/2019   Diarrhea 01/12/2019   CHF (congestive heart failure) ([place]) 01/11/2019   Hyponatremia 12/30/2018   Permanent atrial fibrillation ([place]) 06/10/2018   Arthritis 10/25/2017   COPD mixed type ([place]) 10/25/2017   Fibrocystic disease of breast 10/25/2017   Gastroesophageal reflux disease 10/25/2017   Glaucoma 10/25/2017   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   Heart murmur 10/25/2017   Insomnia 10/25/2017   Osteoporosis 10/25/2017   RLS (restless legs syndrome) 04/23/2017   Routine general medical examination at a health care facility 11/16/2016   Diabetic polyneuropathy ([place]) 07/30/2016   Depression 01/04/2016   Obesity (BMI 30.0-34.9) 10/11/2015   CAD S/P percutaneous coronary angioplasty 04/28/2013   Essential hypertension 05/06/2009   LOW BACK PAIN, CHRONIC 02/12/2008   Anemia 08/15/2007   Hyperlipidemia associated with type 2 diabetes mellitus ([place]) 01/02/2007   ELEVATION, TRANSAMINASE/LDH LEVELS 01/02/2007   PCP:  Hoyt Koch, MD Pharmacy:   [facility], [address] [place] [address] Phone: [phone] Fax: [facility], [address] [place] [address] Phone: [phone] Fax: [phone]     Social Determinants of Health (SDOH) Interventions Housing Interventions: Intervention Not Indicated  Readmission Risk Interventions     No data to display

## 2022-05-20 NOTE — Progress Notes (Signed)
1930 patient ambulating in hall and in room 2200 patient request multiple medications that were not on profile that she takes at home after reviewing home medications Dr paged for orders all medications given      05/20/2022  1245 am patient heart rate in 40s remains in AFIB patient asymptomatic sitting in recliner refusing sheets on recliner because they make it slippery.

## 2022-05-20 NOTE — Progress Notes (Signed)
  Echocardiogram 2D Echocardiogram has been performed.  [name] 05/20/2022, 4:09 PM

## 2022-05-20 NOTE — Consult Note (Signed)
[facility] Consult  Referring Provider: Sanda Klein, MD Primary Care Physician:  Hoyt Koch, MD Primary Gastroenterologist: Previously saw Dr. Annette Stable GI  Reason for Consultation: Symptomatic anemia  HPI: [name] is a 71 y.o. female with known history of colonic AVM sent for admission when she was found to have symptoms of anemia, described as fatigue, progressively worsening weakness, shortness of breath, abdominal bloating and outpatient labs showed hemoglobin of 7.9. Patient states that she intermittently notices liquid black stools and sometimes has bright red blood on wiping. She may have loose liquid bowel movement 4-5 times a day and then will not have a bowel movement for 3 days.  She last noticed black stools about 2 weeks ago and has not had a bowel movement in the last 3 days.  Prior GI work-up: Colonoscopy, 11/04/2020: Cecal AVM, status post APC, 2 tubular adenomas removed from transverse colon EGD 11/04/2020:Unremarkable Capsule endoscopy 11/04/2020: Small pinpoint hemorrhages noted in several spots, no obvious bleeding Colonoscopy 2019: Unremarkable EGD 2019: Unremarkable EGD 2016: Unremarkable Colonoscopy 2016: Tubular adenoma removed from transverse colon, 2 AVMs noted in proximal colon.  Patient has history of iron deficiency anemia and is not able to tolerate oral iron.  She has found over-the-counter liquid iron that she can tolerate better however has not been compliant and consistent with oral iron therapy.  She has however received IV iron infusions in the past which she has tolerated better. She is not on an acid suppressing medicine, has mild acid reflux, denies vomiting, has mild nausea, denies change in appetite, denies weight loss, in fact has gained weight unintentionally, denies difficulty swallowing or pain on swallowing. She is on Xarelto for permanent atrial fibrillation and takes Celebrex twice a day for back  pain.   Past Medical History:  Diagnosis Date   Anemia, unspecified    Arthritis    Asthma    Atrial fibrillation with RVR ([place])    a. on Xarelto   Bell's palsy    Facial nerve decompression in 2001   CHF (congestive heart failure) (HCC)    Chronic low back pain    COPD with asthma    Coronary artery disease    Myoview 04/12/11 was entirely normal. ECHO 02/26/08 showed only minor abnormalities. Stenting 05/26/08 of her posterolateral branch to the left circumflex coronary artery. Used a 2.5x76m Taxus Monorail stent.myoview 2014 was without ischemia   Diabetes mellitus    Type 2   Early cataracts, bilateral    Fatty liver    GERD (gastroesophageal reflux disease)    Glaucoma    Glaucoma    Goiter    Heart murmur    History of nuclear stress test 2012; 2014   lexiscan; normal pattern of perfusion; normal, low risk scan    Hyperlipidemia    Hypertension    Panic disorder    Pneumonia 2008   Polycystic ovary disease    Hysterectomy in 1982 for this   Shortness of breath dyspnea    ECHO 02/26/08 showed only minor abnormalities   Spinal stenosis     Past Surgical History:  Procedure Laterality Date   ABDOMINAL HYSTERECTOMY  1982   & BSO; for polycystic ovary disease   CARDIOVERSION N/A 12/17/2013   Procedure: CARDIOVERSION;  Surgeon: KPixie Casino MD;  [place] [facility]  Service: Cardiovascular;  Laterality: N/A;   CARDIOVERSION N/A 09/30/2015   Procedure: CARDIOVERSION;  Surgeon: KDorothy Spark MD;  [place] [facility]  Service: Cardiovascular;  Laterality:  N/A;   CARDIOVERSION N/A 06/22/2016   Procedure: CARDIOVERSION;  Surgeon: Skeet Latch, MD;  Location: [place];  Service: Cardiovascular;  Laterality: N/A;   CENTRAL LINE INSERTION  01/13/2019   Procedure: CENTRAL LINE INSERTION;  Surgeon: Jolaine Artist, MD;  Location: [place] [facility];  Service: Cardiovascular;;   COLONOSCOPY     last 2009; Dr Cristina Gong; due 2019   COLONOSCOPY WITH  PROPOFOL N/A 01/23/2018   Procedure: COLONOSCOPY WITH PROPOFOL;  Surgeon: Ronald Lobo, MD;  Location: [place];  Service: Endoscopy;  Laterality: N/A;   COLONOSCOPY WITH PROPOFOL N/A 11/04/2020   Procedure: COLONOSCOPY WITH PROPOFOL;  Surgeon: Ronald Lobo, MD;  Location: [place];  Service: Endoscopy;  Laterality: N/A;   CORONARY ANGIOPLASTY  05/26/2008   Stenting of her posterolateral branch to the left circumflex coronary artery. Used a 2.5x68m Taxus Monorail stent.   ESOPHAGOGASTRODUODENOSCOPY (EGD) WITH PROPOFOL N/A 01/23/2018   Procedure: ESOPHAGOGASTRODUODENOSCOPY (EGD) WITH PROPOFOL;  Surgeon: BRonald Lobo MD;  [place] [facility]  Service: Endoscopy;  Laterality: N/A;   ESOPHAGOGASTRODUODENOSCOPY (EGD) WITH PROPOFOL N/A 11/04/2020   Procedure: ESOPHAGOGASTRODUODENOSCOPY (EGD) WITH PROPOFOL;  Surgeon: BRonald Lobo MD;  [place] [facility]  Service: Endoscopy;  Laterality: N/A;   FACIAL NERVE DECOMPRESSION  2001/2002   bells palsy    GIVENS CAPSULE STUDY N/A 11/04/2020   Procedure: GIVENS CAPSULE STUDY;  Surgeon: BRonald Lobo MD;  [place] [facility]  Service: Endoscopy;  Laterality: N/A;   HOT HEMOSTASIS N/A 11/04/2020   Procedure: HOT HEMOSTASIS (ARGON PLASMA COAGULATION/BICAP);  Surgeon: BRonald Lobo MD;  [facility]  Service: Endoscopy;  Laterality: N/A;   LAPAROSCOPIC CHOLECYSTECTOMY  06/15/2011    Dr IDalbert Batman  PERICARDIOCENTESIS N/A 01/13/2019   Procedure: PERICARDIOCENTESIS;  Surgeon: MBurnell Blanks MD;  [facility];  Service: Cardiovascular;  Laterality: N/A;   POLYPECTOMY  11/04/2020   Procedure: POLYPECTOMY;  Surgeon: BRonald Lobo MD;  [place] [facility]  Service: Endoscopy;;   RIGHT AND LEFT HEART CATH N/A 01/13/2019   Procedure: RIGHT AND LEFT HEART CATH;  Surgeon: BJolaine Artist MD;  [facility];  Service: Cardiovascular;  Laterality: N/A;   TEE WITHOUT CARDIOVERSION N/A  12/17/2013   Procedure: TRANSESOPHAGEAL ECHOCARDIOGRAM (TEE);  Surgeon: KPixie Casino MD;  [facility]  Service: Cardiovascular;  Laterality: N/A;  trish/ja   TOTAL HIP ARTHROPLASTY Left 09/25/2021   Procedure: LEFT TOTAL HIP ARTHROPLASTY ANTERIOR APPROACH;  Surgeon: RFrederik Pear MD;  [facility];  Service: Orthopedics;  Laterality: Left;   TRANSTHORACIC ECHOCARDIOGRAM  78/14/4818  LV systolic function normal with mild conc LVH; LA mildly dilated; trace MR/TR   UPPER GI ENDOSCOPY  2009   negative    Prior to Admission medications   Medication Sig Start Date End Date Taking? Authorizing Provider  albuterol (VENTOLIN HFA) 108 (90 Base) MCG/ACT inhaler Inhale 1 puff into the lungs every 6 (six) hours as needed for wheezing or shortness of breath.   Yes [provider]  ascorbic acid (VITAMIN C) 1000 MG tablet Take 1,000 mg by mouth daily.   Yes [provider]  celecoxib (CELEBREX) 100 MG capsule Take 1 capsule (100 mg total) by mouth 2 (two) times daily. 01/19/22  Yes CHoyt Koch MD  colestipol (COLESTID) 1 g tablet Take 0.5 g by mouth 2 (two) times daily. 02/10/20  Yes [provider]  diltiazem (CARDIZEM CD) 180 MG 24 hr capsule TAKE ONE CAPSULE ONCE DAILY 04/02/22  Yes BGwenlyn Found  Pearletha Forge, MD  famotidine (PEPCID) 20 MG tablet Take 20 mg by mouth 2 (two) times daily. 02/10/20  Yes [provider]  gabapentin (NEURONTIN) 100 MG capsule TAKE ONE CAPSULE BY MOUTH THREE TIMES DAILY Patient taking differently: Take 100 mg by mouth 2 (two) times daily. 11/28/21  Yes Hoyt Koch, MD  guaiFENesin (MUCINEX) 600 MG 12 hr tablet Take 600 mg by mouth 2 (two) times daily.   Yes [provider]  HYDROcodone-acetaminophen (NORCO/VICODIN) 5-325 MG tablet Take 1 tablet by mouth 2 (two) times daily as needed for moderate pain. 04/03/22  Yes Hoyt Koch, MD  INVOKANA 100 MG TABS tablet TAKE ONE TABLET BY MOUTH DAILY BEFORE  BREAKFAST.[name] Patient taking differently: Take 100 mg by mouth daily before breakfast. 05/10/22  Yes Hoyt Koch, MD  latanoprost (XALATAN) 0.005 % ophthalmic solution Place 1 drop into both eyes at bedtime. 02/07/21  Yes [provider]  lisinopril (ZESTRIL) 20 MG tablet TAKE ONE TABLET BY MOUTH DAILY Patient taking differently: Take 20 mg by mouth daily. 01/30/22  Yes Hoyt Koch, MD  LORazepam (ATIVAN) 0.5 MG tablet 1 po bid prn Patient taking differently: Take 0.5 mg by mouth 2 (two) times daily. 03/20/22  Yes Plotnikov, Evie Lacks, MD  metoprolol succinate (TOPROL-XL) 100 MG 24 hr tablet TAKE ONE TABLET BY MOUTH TWICE DAILY 05/18/22  Yes Croitoru, Mihai, MD  metoprolol tartrate (LOPRESSOR) 25 MG tablet Take 1 tablet (25 mg total) by mouth daily as needed (if HR increases). 07/27/21  Yes Croitoru, Mihai, MD  Multiple Minerals-Vitamins (CALCIUM CITRATE PLUS/MAGNESIUM PO) Take 400 mg by mouth daily.   Yes [provider]  nystatin (MYCOSTATIN/NYSTOP) powder Apply 1 application topically once a week. 06/20/21  Yes Hoyt Koch, MD  nystatin ointment (MYCOSTATIN) Apply 1 application topically 2 (two) times daily as needed (IRRITATION). Patient taking differently: Apply 1 application  topically daily as needed (IRRITATION). 06/20/21  Yes Hoyt Koch, MD  PARoxetine (PAXIL) 10 MG tablet TAKE 2 AND 1/2 TABLETS BY MOUTH ONCE DAILY Patient taking differently: Take 25 mg by mouth daily. 02/12/22  Yes Hoyt Koch, MD  potassium chloride (MICRO-K) 10 MEQ CR capsule Take 1 capsule (10 mEq total) by mouth daily. Patient taking differently: Take 10 mEq by mouth every other day. 03/08/22  Yes Croitoru, Mihai, MD  rivaroxaban (XARELTO) 10 MG TABS tablet Take 1 tablet (10 mg total) by mouth daily. Patient taking differently: Take 10 mg by mouth at bedtime. 09/25/21  Yes Leighton Parody, PA-C  rOPINIRole (REQUIP XL) 2 MG 24 hr tablet Take 1 tablet (2 mg total)  by mouth at bedtime. 11/27/21  Yes Dohmeier, Asencion Partridge, MD  rosuvastatin (CRESTOR) 10 MG tablet TAKE ONE TABLET AT BEDTIME. Patient taking differently: Take 10 mg by mouth every evening. 03/15/22  Yes Croitoru, Mihai, MD  senna (SENOKOT) 8.6 MG TABS Take 1 tablet by mouth at bedtime as needed for mild constipation.   Yes [provider]  sitaGLIPtin-metformin (JANUMET) 50-1000 MG tablet Take 1 tablet by mouth 2 (two) times daily with a meal. 03/09/22  Yes Hoyt Koch, MD  torsemide (DEMADEX) 20 MG tablet Take 1 tablet (20 mg total) by mouth daily. 10/18/21  Yes Burns, Claudina Lick, MD  traZODone (DESYREL) 100 MG tablet Up to 200 mg at bedtime for sleep Patient taking differently: Take 150 mg by mouth at bedtime. 06/15/21  Yes Dohmeier, Asencion Partridge, MD  triamcinolone cream (KENALOG) 0.1 % Apply 1 Application topically  2 (two) times daily. Patient taking differently: Apply 1 Application topically 2 (two) times daily as needed (Rash). 04/03/22  Yes Hoyt Koch, MD  umeclidinium-vilanterol [facility] ELLIPTA) 62.5-25 MCG/ACT AEPB Inhale 1 puff into the lungs daily. 12/25/21  Yes Baird Lyons D, MD  Accu-Chek Softclix Lancets lancets Use as instructed 11/10/21   Hoyt Koch, MD  glucose blood (FREESTYLE LITE) test strip Use as instructed 11/10/21   Hoyt Koch, MD    Current Facility-Administered Medications  Medication Dose Route Frequency Provider Last Rate Last Admin   0.9 %  sodium chloride infusion (Manually program via Guardrails IV Fluids)   Intravenous Once Cheryln Manly, NP       acetaminophen (TYLENOL) tablet 650 mg  650 mg Oral Q4H PRN Cheryln Manly, NP       diltiazem (CARDIZEM CD) 24 hr capsule 180 mg  180 mg Oral Daily Reino Bellis B, NP   180 mg at 05/20/22 0907   ferumoxytol (FERAHEME) 510 mg in sodium chloride 0.9 % 100 mL IVPB  510 mg Intravenous Once Reino Bellis B, NP       gabapentin (NEURONTIN) capsule 100 mg  100 mg Oral TID Reino Bellis B, NP   100 mg at 05/20/22 0906   insulin aspart (novoLOG) injection 0-15 Units  0-15 Units Subcutaneous TID WC Reino Bellis B, NP   3 Units at 05/20/22 4656   latanoprost (XALATAN) 0.005 % ophthalmic solution 1 drop  1 drop Both Eyes QHS Mayl, Pearletha Forge, MD   1 drop at 05/19/22 2304   LORazepam (ATIVAN) tablet 1 mg  1 mg Oral QHS PRN Mayl, Pearletha Forge, MD   1 mg at 05/19/22 2258   metoprolol succinate (TOPROL-XL) 24 hr tablet 100 mg  100 mg Oral BID Reino Bellis B, NP   100 mg at 05/20/22 0906   nitroGLYCERIN (NITROSTAT) SL tablet 0.4 mg  0.4 mg Sublingual Q5 Min x 3 PRN Cheryln Manly, NP       ondansetron [facility]) injection 4 mg  4 mg Intravenous Q6H PRN Reino Bellis B, NP       PARoxetine (PAXIL) tablet 10 mg  10 mg Oral Daily Reino Bellis B, NP   10 mg at 05/20/22 8127   rOPINIRole (REQUIP) tablet 1 mg  1 mg Oral BID Reino Bellis B, NP   1 mg at 05/20/22 5170   rosuvastatin (CRESTOR) tablet 10 mg  10 mg Oral QHS Reino Bellis B, NP   10 mg at 05/19/22 2208   traZODone (DESYREL) tablet 150 mg  150 mg Oral QHS Reino Bellis B, NP   150 mg at 05/19/22 2207    Allergies as of 05/19/2022 - Review Complete 05/19/2022  Allergen Reaction Noted   Benadryl [diphenhydramine hcl] Other (See Comments) 06/15/2011   Clopidogrel bisulfate  12/02/2020   Ioversol Other (See Comments) 12/16/2020   Benadryl [diphenhydramine] Anxiety 02/28/2021   Iodinated contrast media Palpitations and Rash 06/15/2011    Family History  Problem Relation Age of Onset   Heart attack Father 70       2nd MI at 22   Colon cancer Brother 61   Gout Brother    Ulcers Mother    Emphysema Mother 59   Colon polyps Sister    Cancer Sister        Basal cell carcinoma   Pneumonia Maternal Grandmother    Hypertension Brother    Hyperlipidemia Brother    Cancer Brother  Skin   Diabetes Neg Hx    Stroke Neg Hx     Social History   Socioeconomic History   Marital status:  Married    Spouse name: Not on file   Number of children: 2   Years of education: master's   Highest education level: Not on file  Occupational History   Occupation: Electrical engineer    Employer: [employer]  Tobacco Use   Smoking status: Former    Packs/day: 1.50    Years: 30.00    Total pack years: 45.00    Types: Cigarettes    Quit date: 08/13/1993    Years since quitting: 28.7   Smokeless tobacco: Never  Vaping Use   Vaping Use: Never used  Substance and Sexual Activity   Alcohol use: No   Drug use: No   Sexual activity: Not on file  Other Topics Concern   Not on file  Social History Narrative   ** Merged History Encounter **       Social Determinants of Health   Financial Resource Strain: Not on file  Food Insecurity: No Food Insecurity (05/20/2022)   Hunger Vital Sign    Worried About Running Out of Food in the Last Year: Never true    Ran Out of Food in the Last Year: Never true  Transportation Needs: No Transportation Needs (05/20/2022)   PRAPARE - Hydrologist (Medical): No    Lack of Transportation (Non-Medical): No  Physical Activity: Not on file  Stress: Not on file  Social Connections: Not on file  Intimate Partner Violence: Not At Risk (05/20/2022)   Humiliation, Afraid, Rape, and Kick questionnaire    Fear of Current or Ex-Partner: No    Emotionally Abused: No    Physically Abused: No    Sexually Abused: No    Review of Systems: Positive for: GI: Described in detail in HPI.    Gen: fatigue, weakness,  denies any fever, chills, rigors, night sweats, anorexia, malaise, involuntary weight loss and sleep disorder CV: Denies chest pain, angina, palpitations, syncope, orthopnea, PND, peripheral edema, and claudication. Resp: dyspnea,  denies cough, sputum, wheezing, coughing up blood. GU : Denies urinary burning, blood in urine, urinary frequency, urinary hesitancy, nocturnal urination, and urinary incontinence. MS:  Back pain, uses a walker for ambulation.  Derm: Denies rash, itching, oral ulcerations, hives, unhealing ulcers.  Psych: Denies depression, anxiety, memory loss, suicidal ideation, hallucinations,  and confusion. Heme: Denies bruising, bleeding, and enlarged lymph nodes. Neuro:  Denies any headaches, dizziness, paresthesias. Endo:  DM, Denies any problems with thyroid, adrenal function.  Physical Exam: Vital signs in last 24 hours: Temp:  [97.7 F (36.5 C)-98.8 F (37.1 C)] 98.4 F (36.9 C) (10/08 0716) Pulse Rate:  [54-76] 74 (10/08 0716) Resp:  [17-20] 18 (10/08 0716) BP: (112-129)/(37-71) 127/71 (10/08 0716) SpO2:  [92 %-96 %] 94 % (10/08 0716) Weight:  [86.1 kg-86.3 kg] 86.3 kg (10/08 0516) Last BM Date : 05/19/22  General:   Alert,  Well-developed, obese, pleasant and cooperative in NAD Head:  Normocephalic and atraumatic. Eyes:  Sclera clear, no icterus.  Mild pallor Ears:  Normal auditory acuity. Nose:  No deformity, discharge,  or lesions. Mouth:  No deformity or lesions.  Oropharynx pink & moist. Neck:  Supple; no masses or thyromegaly. Lungs:  Clear throughout to auscultation.   No wheezes, crackles, or rhonchi. No acute distress. Heart:  Irregular rate and rhythm. Extremities:  Without clubbing or  edema. Neurologic:  Alert and  oriented x4;  grossly normal neurologically. Skin:  Intact without significant lesions or rashes. Psych:  Alert and cooperative. Normal mood and affect. Abdomen:  Soft, nontender and distended. No masses, hepatosplenomegaly or hernias noted. Normal bowel sounds, without guarding, and without rebound.         Lab Results: Recent Labs    05/18/22 1623 05/19/22 1708 05/20/22 0104  WBC 6.5 6.1 8.6  HGB 7.9* 8.1* 8.1*  HCT 25.2* 24.0* 25.0*  PLT 235 206 257   BMET Recent Labs    05/18/22 1623 05/19/22 1708 05/20/22 0104  NA 128* 125* 122*  K 5.2 4.8 4.9  CL 93* 93* 90*  CO2 19* 22 22  GLUCOSE 198* 196* 164*  BUN 25 22 24*   CREATININE 1.44* 1.39* 1.56*  CALCIUM 8.9 8.9 8.8*   LFT Recent Labs    05/19/22 1708  PROT 7.0  ALBUMIN 3.9  AST 22  ALT 16  ALKPHOS 55  BILITOT 0.5   PT/INR No results for input(s): "LABPROT", "INR" in the last 72 hours.  Studies/Results: No results found.  Impression: Symptomatic anemia, hemoglobin 7.9 at admission, has remained stable at 8.1/8.1, mild microcytosis, MCV 78.7 Iron saturation low at 6%, ferritin low at 7, TIBC elevated at 598   Hyponatremia, worse today at 122 Worsening renal function, BUN 24, creatinine 1.56, GFR 35   Multiple comorbidities-permanent atrial fibrillation on Xarelto, Toprol and diltiazem, heart failure with preserved ejection fraction on torsemide, hypertension, dyslipidemia, diabetes  Plan: Patient with colonic AVM, status post APC.  Discussed with patient, her spouse at bedside and her cardiologist Dr. Sallyanne Kuster that patient will likely have chronic iron deficiency anemia from chronic/slow oozing AVMs.  No plans for endoscopic intervention without obvious melena/hematochezia or hemodynamic instability.  Patient will need oral iron supplement(reports only being able to tolerate over-the-counter liquid iron) along with IV iron on a periodic basis with monitoring of her labs for CBC and iron panel every 6 to 8 weeks until they stabilize and then can be done on a 3 monthly basis.  Okay to resume Xarelto today.  Feraheme 510 mg IV has been ordered for 1 dose for tomorrow.  Patient will need outpatient follow-up with GI.  We will sign off, please recall GI if needed.   LOS: 1 day   Ronnette Juniper, MD  05/20/2022, 9:34 AM

## 2022-05-20 NOTE — Progress Notes (Signed)
Rounding Note    Patient Name: [name] Date of Encounter: 05/20/2022  [place] Cardiologist: Sanda Klein, MD   Subjective   No cardiovascular complaints.  Slept in recliner as she usually does because of her back. Wife reports that she is less sharp than usual and has noticed this for a few days preceding the admission.  Was also less steady on  her feet.  Suspect these are symptoms of hyponatremia. Sodium is worse at 122, creatinine has also worsened.  Hemoglobin stable around 8.  Very low iron stores. No bowel movement yet to check for occult bleeding.  Inpatient Medications    Scheduled Meds:  sodium chloride   Intravenous Once   diltiazem  180 mg Oral Daily   gabapentin  100 mg Oral TID   insulin aspart  0-15 Units Subcutaneous TID WC   latanoprost  1 drop Both Eyes QHS   metoprolol succinate  100 mg Oral BID   PARoxetine  10 mg Oral Daily   rOPINIRole  1 mg Oral BID   rosuvastatin  10 mg Oral QHS   traZODone  150 mg Oral QHS   Continuous Infusions:  ferumoxytol (FERAHEME) 510 mg in sodium chloride 0.9 % 100 mL IVPB     PRN Meds: acetaminophen, LORazepam, nitroGLYCERIN, ondansetron (ZOFRAN) IV   Vital Signs    Vitals:   05/20/22 0111 05/20/22 0124 05/20/22 0516 05/20/22 0716  BP: (!) 112/37 (!) 118/42 (!) 115/40 127/71  Pulse: (!) 54  68 74  Resp: '17  20 18  '$ Temp: 97.7 F (36.5 C)  98.5 F (36.9 C) 98.4 F (36.9 C)  TempSrc: Oral  Oral Oral  SpO2: 96%  92% 94%  Weight:   86.3 kg   Height:        Intake/Output Summary (Last 24 hours) at 05/20/2022 1040 Last data filed at 05/19/2022 2300 Gross per 24 hour  Intake 250 ml  Output --  Net 250 ml      05/20/2022    5:16 AM 05/19/2022    4:37 PM 05/18/2022    3:44 PM  Last 3 Weights  Weight (lbs) 190 lb 4.8 oz 189 lb 12.8 oz 187 lb  Weight (kg) 86.32 kg 86.093 kg 84.823 kg      Telemetry    Atrial fibrillation with controlled ventricular rate- Personally Reviewed  ECG    No  new tracing- Personally Reviewed  Physical Exam  Obese. GEN: No acute distress.   Neck: No JVD Cardiac: Irregular, no murmurs, rubs, or gallops.  Respiratory: Clear to auscultation bilaterally. GI: Soft, nontender, non-distended  MS: No edema; No deformity. Neuro:  Nonfocal  Psych: Normal affect   Labs    High Sensitivity Troponin:  No results for input(s): "TROPONINIHS" in the last 720 hours.   Chemistry Recent Labs  Lab 05/18/22 1623 05/19/22 1708 05/20/22 0104  NA 128* 125* 122*  K 5.2 4.8 4.9  CL 93* 93* 90*  CO2 19* 22 22  GLUCOSE 198* 196* 164*  BUN 25 22 24*  CREATININE 1.44* 1.39* 1.56*  CALCIUM 8.9 8.9 8.8*  MG 1.8  --   --   PROT  --  7.0  --   ALBUMIN  --  3.9  --   AST  --  22  --   ALT  --  16  --   ALKPHOS  --  55  --   BILITOT  --  0.5  --   GFRNONAA  --  40* 35*  ANIONGAP  --  10 10    Lipids  Recent Labs  Lab 05/18/22 1623  CHOL 110  TRIG 208*  HDL 44  LABVLDL 33  LDLCALC 33  CHOLHDL 2.5    Hematology Recent Labs  Lab 05/18/22 1623 05/19/22 1708 05/20/22 0104  WBC 6.5 6.1 8.6  RBC 3.03* 3.05*  3.00* 3.11*  HGB 7.9* 8.1* 8.1*  HCT 25.2* 24.0* 25.0*  MCV 83 78.7* 80.4  MCH 26.1* 26.6 26.0  MCHC 31.3* 33.8 32.4  RDW 15.0 14.1 14.3  PLT 235 206 257   Thyroid No results for input(s): "TSH", "FREET4" in the last 168 hours.  BNP Recent Labs  Lab 05/18/22 1623  BNP 102.2*    DDimer No results for input(s): "DDIMER" in the last 168 hours.   Radiology    No results found.  Cardiac Studies   Echo: 11/2019   IMPRESSIONS     1. Abnormal septal motion and hypokinesis as well as inferior basal  hypokinesis EF similar to echo done 05/2019 or slightly better . Left  ventricular ejection fraction, by estimation, is 50 to 55%. The left  ventricle has low normal function. The left  ventricle demonstrates regional wall motion abnormalities (see scoring  diagram/findings for description). Left ventricular diastolic parameters   are indeterminate.   2. Right ventricular systolic function is normal. The right ventricular  size is normal.   3. Left atrial size was moderately dilated.   4. Right atrial size was moderately dilated.   5. The mitral valve is degenerative. Trivial mitral valve regurgitation.  No evidence of mitral stenosis.   6. The aortic valve is tricuspid. Aortic valve regurgitation is trivial.  Mild to moderate aortic valve sclerosis/calcification is present, without  any evidence of aortic stenosis.   7. The inferior vena cava is normal in size with greater than 50%  respiratory variability, suggesting right atrial pressure of 3 mmHg.   Comparison(s): 05/19/19 EF 45-50%.   FINDINGS   Left Ventricle: Abnormal septal motion and hypokinesis as well as  inferior basal hypokinesis EF similar to echo done 05/2019 or slightly  better. Left ventricular ejection fraction, by estimation, is 50 to 55%.  The left ventricle has low normal function.   The left ventricle demonstrates regional wall motion abnormalities.  Definity contrast agent was given IV to delineate the left ventricular  endocardial borders. The left ventricular internal cavity size was normal  in size. There is no left ventricular  hypertrophy. Left ventricular diastolic parameters are indeterminate.   Right Ventricle: The right ventricular size is normal. No increase in  right ventricular wall thickness. Right ventricular systolic function is  normal.   Left Atrium: Left atrial size was moderately dilated.   Right Atrium: Right atrial size was moderately dilated.   Pericardium: There is no evidence of pericardial effusion.   Mitral Valve: The mitral valve is degenerative in appearance. There is  moderate thickening of the mitral valve leaflet(s). There is moderate  calcification of the mitral valve leaflet(s). Normal mobility of the  mitral valve leaflets. Moderate mitral  annular calcification. Trivial mitral valve  regurgitation. No evidence of  mitral valve stenosis.   Tricuspid Valve: The tricuspid valve is normal in structure. Tricuspid  valve regurgitation is trivial. No evidence of tricuspid stenosis.   Aortic Valve: The aortic valve is tricuspid. Aortic valve regurgitation is  trivial. Mild to moderate aortic valve sclerosis/calcification is present,  without any evidence of aortic stenosis.   Pulmonic  Valve: The pulmonic valve was normal in structure. Pulmonic valve  regurgitation is not visualized. No evidence of pulmonic stenosis.   Aorta: The aortic root is normal in size and structure.   Venous: The inferior vena cava is normal in size with greater than 50%  respiratory variability, suggesting right atrial pressure of 3 mmHg.   IAS/Shunts: No atrial level shunt detected by color flow Doppler.    Nuclear stress test 06/12/2021   Findings are consistent with no prior ischemia and no prior myocardial infarction. The study is low risk.   No ST deviation was noted.   LV perfusion is normal. There is no evidence of ischemia. There is no evidence of infarction.   End diastolic cavity size is normal. End systolic cavity size is normal.   Normal perfusion EF may not be accurate due to afib estimated at 51% no RWMAls noted     Patient Profile     71 y.o. female with history of permanent atrial fibrillation on oral anticoagulants, remote history of tachycardia cardiomyopathy, chronic diastolic heart failure, coronary artery disease status post remote stent to PLV (2009), diabetes mellitus type 2, hypertension, recurrent problems of iron deficiency anemia due to GI bleeding, colonic AVMs, remote history of hemothorax with tamponade in 2020 presented with complaints of fatigue, weakness, slight disorientation and found to have severe anemia, acute kidney injury and hyponatremia leading to hospitalization  Assessment & Plan    CHF: No overt signs of hypervolemia.  Her weight has increased, but  this appears to be true weight gain.  Her BNP is only 102, much lower than previous values.  Diuretics have been stopped due to acute kidney injury and hyponatremia.  SGLT2 inhibitor has also been discontinued. Hyponatremia: Severe and symptomatic.  Started fluid restriction.  Holding diuretics and SGLT2 inhibitors.  Probably hypovolemic, but preferred to give blood rather than crystalloid. Iron deficiency anemia: Noted to have colonic AVMs.  She was also taking Celebrex on a regular basis for back pain.  Needs chronic anticoagulation for atrial fibrillation with embolic risk.  Will require more frequent evaluation of iron stores and more frequent iron infusions.  Will administer 1 unit PRBC and start iron infusions today.  Would prefer using Eliquis, rather than Xarelto due to potentially lower GI bleeding risk. Paroxysmal atrial fibrillation: Well rate controlled.  High embolic risk.  CHA2DS2-VASc score 6 (age, gender, CHF, HTN, DM, CAD).  Has been able to tolerate anticoagulation chronically but with repeated episodes of GI bleeding.  I think she is an excellent watchman candidate.  First we have to resolve her metabolic abnormalities and improve her hemoglobin.  I have seen [name] is a 71 y.o. female in the hospital today who is being considered for a Watchman left atrial appendage closure device.  She has a history of permanent atrial fibrillation.  This patients CHA2DS2-VASc Score and unadjusted Ischemic Stroke Rate (% per year) is equal to 9.7 % stroke rate/year from a score of 6 which necessitates long term oral anticoagulation to prevent stroke.  Unfortunately, She is not felt to be a long term oral anticoagulation candidate secondary to recurrent iron deficiency anemia due to GI bleeding.  The patients chart has been reviewed and I feel that they would be a candidate for short term oral anticoagulation.  Procedural risks for the Watchman implant have been reviewed with the patient including a 1%  risk of stroke, 2% risk of perforation, 0.1% risk of device embolization.  Given the patient's poor  candidacy for long-term oral anticoagulation and ability to tolerate short term oral anticoagulation I have recommended the watchman left atrial appendage closure system.  AKI: Diuretics stopped.  Avoid RAAS inhibitors.  Stopped NSAIDs as well.   For questions or updates, please contact [place] Please consult [email or web address] for contact info under        Signed, Sanda Klein, MD  05/20/2022, 10:40 AM

## 2022-05-21 ENCOUNTER — Other Ambulatory Visit (HOSPITAL_COMMUNITY): Payer: Self-pay

## 2022-05-21 ENCOUNTER — Telehealth (HOSPITAL_COMMUNITY): Payer: Self-pay | Admitting: Pharmacy Technician

## 2022-05-21 DIAGNOSIS — I480 Paroxysmal atrial fibrillation: Secondary | ICD-10-CM

## 2022-05-21 DIAGNOSIS — D500 Iron deficiency anemia secondary to blood loss (chronic): Secondary | ICD-10-CM | POA: Diagnosis not present

## 2022-05-21 DIAGNOSIS — N179 Acute kidney failure, unspecified: Secondary | ICD-10-CM | POA: Diagnosis not present

## 2022-05-21 DIAGNOSIS — E871 Hypo-osmolality and hyponatremia: Secondary | ICD-10-CM | POA: Diagnosis not present

## 2022-05-21 LAB — BASIC METABOLIC PANEL
Anion gap: 10 (ref 5–15)
BUN: 22 mg/dL (ref 8–23)
CO2: 25 mmol/L (ref 22–32)
Calcium: 9.3 mg/dL (ref 8.9–10.3)
Chloride: 94 mmol/L — ABNORMAL LOW (ref 98–111)
Creatinine, Ser: 1.23 mg/dL — ABNORMAL HIGH (ref 0.44–1.00)
GFR, Estimated: 46 mL/min — ABNORMAL LOW (ref >60)
Glucose, Bld: 163 mg/dL — ABNORMAL HIGH (ref 70–99)
Potassium: 4.6 mmol/L (ref 3.5–5.1)
Sodium: 129 mmol/L — ABNORMAL LOW (ref 135–145)

## 2022-05-21 LAB — BPAM RBC
Blood Product Expiration Date: 202310282359
ISSUE DATE / TIME: 202310081342
Unit Type and Rh: 6200

## 2022-05-21 LAB — TYPE AND SCREEN
ABO/RH(D): A POS
Antibody Screen: NEGATIVE
Unit division: 0

## 2022-05-21 LAB — GLUCOSE, CAPILLARY
Glucose-Capillary: 137 mg/dL — ABNORMAL HIGH (ref 70–99)
Glucose-Capillary: 202 mg/dL — ABNORMAL HIGH (ref 70–99)
Glucose-Capillary: 132 mg/dL — ABNORMAL HIGH (ref 70–99)
Glucose-Capillary: 243 mg/dL — ABNORMAL HIGH (ref 70–99)

## 2022-05-21 LAB — HEMOGLOBIN AND HEMATOCRIT, BLOOD
HCT: 26.5 % — ABNORMAL LOW (ref 36.0–46.0)
Hemoglobin: 8.7 g/dL — ABNORMAL LOW (ref 12.0–15.0)

## 2022-05-21 MED ORDER — DOCUSATE SODIUM 100 MG PO CAPS
200.0000 mg | ORAL_CAPSULE | Freq: Every day | ORAL | Status: DC
  Administered 2022-05-21 – 2022-05-22 (×2): 200 mg via ORAL
  Filled 2022-05-21 (×2): qty 2

## 2022-05-21 MED ORDER — APIXABAN 5 MG PO TABS
5.0000 mg | ORAL_TABLET | Freq: Two times a day (BID) | ORAL | Status: DC
  Administered 2022-05-21 – 2022-05-22 (×3): 5 mg via ORAL
  Filled 2022-05-21 (×3): qty 1

## 2022-05-21 NOTE — Progress Notes (Signed)
Rounding Note    Patient Name: [name] Date of Encounter: 05/21/2022  [place] Cardiologist: Sanda Klein, MD   Subjective   Patient sitting up on the side of the bed eating breakfast this morning. She reports that she has ambulated throughout the unit since receiving PRBCs and IV iron with a significant improvement in symptoms. In fact, patient says she feels "back to normal" and she very much wishes to go home.   Inpatient Medications    Scheduled Meds:  diltiazem  180 mg Oral Daily   gabapentin  100 mg Oral TID   insulin aspart  0-15 Units Subcutaneous TID WC   latanoprost  1 drop Both Eyes QHS   metoprolol succinate  100 mg Oral BID   PARoxetine  10 mg Oral Daily   rOPINIRole  1 mg Oral BID   rosuvastatin  10 mg Oral QHS   traZODone  150 mg Oral QHS   Continuous Infusions:  PRN Meds: acetaminophen, LORazepam, nitroGLYCERIN, ondansetron (ZOFRAN) IV   Vital Signs    Vitals:   05/20/22 2013 05/21/22 0015 05/21/22 0442 05/21/22 0731  BP: (!) 143/57 (!) 125/57 (!) 152/54 (!) 152/57  Pulse: 80 62 87 75  Resp: '16 16 16 18  '$ Temp: 97.6 F (36.4 C) 97.6 F (36.4 C) 98.4 F (36.9 C) (!) 97.4 F (36.3 C)  TempSrc: Oral Oral Oral Oral  SpO2: 93% 96% 93% 95%  Weight:   87 kg   Height:        Intake/Output Summary (Last 24 hours) at 05/21/2022 0928 Last data filed at 05/21/2022 0900 Gross per 24 hour  Intake 1090 ml  Output --  Net 1090 ml      05/21/2022    4:42 AM 05/20/2022    5:16 AM 05/19/2022    4:37 PM  Last 3 Weights  Weight (lbs) 191 lb 11.2 oz 190 lb 4.8 oz 189 lb 12.8 oz  Weight (kg) 86.955 kg 86.32 kg 86.093 kg      Telemetry    Rate controlled afib - Personally Reviewed  ECG    No new tracing today  Physical Exam   GEN: No acute distress.   Neck: No JVD Cardiac: irregularly irregular, no murmurs, rubs, or gallops.  Respiratory: Clear to auscultation bilaterally. GI: Soft, nontender, non-distended  MS: No edema; No  deformity. Neuro:  Nonfocal  Psych: Normal affect   Labs    High Sensitivity Troponin:  No results for input(s): "TROPONINIHS" in the last 720 hours.   Chemistry Recent Labs  Lab 05/18/22 1623 05/19/22 1708 05/20/22 0104 05/21/22 0305  NA 128* 125* 122* 129*  K 5.2 4.8 4.9 4.6  CL 93* 93* 90* 94*  CO2 19* '22 22 25  '$ GLUCOSE 198* 196* 164* 163*  BUN 25 22 24* 22  CREATININE 1.44* 1.39* 1.56* 1.23*  CALCIUM 8.9 8.9 8.8* 9.3  MG 1.8  --   --   --   PROT  --  7.0  --   --   ALBUMIN  --  3.9  --   --   AST  --  22  --   --   ALT  --  16  --   --   ALKPHOS  --  55  --   --   BILITOT  --  0.5  --   --   GFRNONAA  --  40* 35* 46*  ANIONGAP  --  '10 10 10    '$ Lipids  Recent Labs  Lab 05/18/22 1623  CHOL 110  TRIG 208*  HDL 44  LABVLDL 33  LDLCALC 33  CHOLHDL 2.5    Hematology Recent Labs  Lab 05/18/22 1623 05/19/22 1708 05/20/22 0104 05/21/22 0305  WBC 6.5 6.1 8.6  --   RBC 3.03* 3.05*  3.00* 3.11*  --   HGB 7.9* 8.1* 8.1* 8.7*  HCT 25.2* 24.0* 25.0* 26.5*  MCV 83 78.7* 80.4  --   MCH 26.1* 26.6 26.0  --   MCHC 31.3* 33.8 32.4  --   RDW 15.0 14.1 14.3  --   PLT 235 206 257  --    Thyroid No results for input(s): "TSH", "FREET4" in the last 168 hours.  BNP Recent Labs  Lab 05/18/22 1623  BNP 102.2*    DDimer No results for input(s): "DDIMER" in the last 168 hours.   Radiology    ECHOCARDIOGRAM COMPLETE  Result Date: 05/20/2022    ECHOCARDIOGRAM REPORT   Patient Name:   VESTER TITSWORTH Date of Exam: 05/20/2022 Medical Rec #:  [id number]      Height:       62.0 in Accession #:    [id number]     Weight:       190.3 lb Date of Birth:  10/24/1950      BSA:          1.872 m Patient Age:    25 years       BP:           115/51 mmHg Patient Gender: F              HR:           63 bpm. Exam Location:  Inpatient Procedure: 2D Echo, Color Doppler and Cardiac Doppler Indications:    dyspnea  History:        Patient has prior history of Echocardiogram examinations, most                  recent 12/09/2019. CHF, CAD, COPD, Arrythmias:Atrial                 Fibrillation, Signs/Symptoms:Murmur; Risk Factors:Hypertension,                 Dyslipidemia and Diabetes.  Sonographer:    Johny Chess RDCS Referring Phys: [place]  1. Left ventricular ejection fraction, by estimation, is 60 to 65%. The left ventricle has normal function. The left ventricle has no regional wall motion abnormalities. Left ventricular diastolic function could not be evaluated.  2. Right ventricular systolic function is mildly reduced. The right ventricular size is normal. Tricuspid regurgitation signal is inadequate for assessing PA pressure.  3. Left atrial size was mildly dilated.  4. The mitral valve is normal in structure. Trivial mitral valve regurgitation. No evidence of mitral stenosis.  5. The aortic valve is normal in structure. Aortic valve regurgitation is mild. No aortic stenosis is present.  6. The inferior vena cava is dilated in size with >50% respiratory variability, suggesting right atrial pressure of 8 mmHg. FINDINGS  Left Ventricle: Left ventricular ejection fraction, by estimation, is 60 to 65%. The left ventricle has normal function. The left ventricle has no regional wall motion abnormalities. The left ventricular internal cavity size was normal in size. There is  no left ventricular hypertrophy. Left ventricular diastolic function could not be evaluated. Right Ventricle: The right ventricular size is normal. No increase in right ventricular wall thickness. Right  ventricular systolic function is mildly reduced. Tricuspid regurgitation signal is inadequate for assessing PA pressure. Left Atrium: Left atrial size was mildly dilated. Right Atrium: Right atrial size was normal in size. Pericardium: There is no evidence of pericardial effusion. Mitral Valve: The mitral valve is normal in structure. Mild mitral annular calcification. Trivial mitral valve regurgitation. No  evidence of mitral valve stenosis. Tricuspid Valve: The tricuspid valve is normal in structure. Tricuspid valve regurgitation is trivial. No evidence of tricuspid stenosis. Aortic Valve: The aortic valve is normal in structure. Aortic valve regurgitation is mild. No aortic stenosis is present. Pulmonic Valve: The pulmonic valve was normal in structure. Pulmonic valve regurgitation is not visualized. No evidence of pulmonic stenosis. Aorta: The aortic root is normal in size and structure. Venous: The inferior vena cava is dilated in size with greater than 50% respiratory variability, suggesting right atrial pressure of 8 mmHg. IAS/Shunts: No atrial level shunt detected by color flow Doppler.  LEFT VENTRICLE PLAX 2D LVIDd:         4.40 cm LVIDs:         2.60 cm LV PW:         1.00 cm LV IVS:        0.80 cm LVOT diam:     1.80 cm LVOT Area:     2.54 cm  RIGHT VENTRICLE         IVC TAPSE (M-mode): 1.5 cm  IVC diam: 2.20 cm LEFT ATRIUM             Index        RIGHT ATRIUM           Index LA diam:        4.20 cm 2.24 cm/m   RA Area:     14.30 cm LA Vol (A2C):   63.7 ml 34.03 ml/m  RA Volume:   31.10 ml  16.62 ml/m LA Vol (A4C):   81.7 ml 43.65 ml/m LA Biplane Vol: 72.2 ml 38.57 ml/m   AORTA Ao Root diam: 2.20 cm Ao Asc diam:  2.40 cm  SHUNTS Systemic Diam: 1.80 cm Fransico Him MD Electronically signed by Fransico Him MD Signature Date/Time: 05/20/2022/5:08:25 PM    Final     Cardiac Studies     Echo: 11/2019   IMPRESSIONS     1. Abnormal septal motion and hypokinesis as well as inferior basal  hypokinesis EF similar to echo done 05/2019 or slightly better . Left  ventricular ejection fraction, by estimation, is 50 to 55%. The left  ventricle has low normal function. The left  ventricle demonstrates regional wall motion abnormalities (see scoring  diagram/findings for description). Left ventricular diastolic parameters  are indeterminate.   2. Right ventricular systolic function is normal. The right  ventricular  size is normal.   3. Left atrial size was moderately dilated.   4. Right atrial size was moderately dilated.   5. The mitral valve is degenerative. Trivial mitral valve regurgitation.  No evidence of mitral stenosis.   6. The aortic valve is tricuspid. Aortic valve regurgitation is trivial.  Mild to moderate aortic valve sclerosis/calcification is present, without  any evidence of aortic stenosis.   7. The inferior vena cava is normal in size with greater than 50%  respiratory variability, suggesting right atrial pressure of 3 mmHg.   Comparison(s): 05/19/19 EF 45-50%.   FINDINGS   Left Ventricle: Abnormal septal motion and hypokinesis as well as  inferior basal hypokinesis EF similar to echo  done 05/2019 or slightly  better. Left ventricular ejection fraction, by estimation, is 50 to 55%.  The left ventricle has low normal function.   The left ventricle demonstrates regional wall motion abnormalities.  Definity contrast agent was given IV to delineate the left ventricular  endocardial borders. The left ventricular internal cavity size was normal  in size. There is no left ventricular  hypertrophy. Left ventricular diastolic parameters are indeterminate.   Right Ventricle: The right ventricular size is normal. No increase in  right ventricular wall thickness. Right ventricular systolic function is  normal.   Left Atrium: Left atrial size was moderately dilated.   Right Atrium: Right atrial size was moderately dilated.   Pericardium: There is no evidence of pericardial effusion.   Mitral Valve: The mitral valve is degenerative in appearance. There is  moderate thickening of the mitral valve leaflet(s). There is moderate  calcification of the mitral valve leaflet(s). Normal mobility of the  mitral valve leaflets. Moderate mitral  annular calcification. Trivial mitral valve regurgitation. No evidence of  mitral valve stenosis.   Tricuspid Valve: The tricuspid valve  is normal in structure. Tricuspid  valve regurgitation is trivial. No evidence of tricuspid stenosis.   Aortic Valve: The aortic valve is tricuspid. Aortic valve regurgitation is  trivial. Mild to moderate aortic valve sclerosis/calcification is present,  without any evidence of aortic stenosis.   Pulmonic Valve: The pulmonic valve was normal in structure. Pulmonic valve  regurgitation is not visualized. No evidence of pulmonic stenosis.   Aorta: The aortic root is normal in size and structure.   Venous: The inferior vena cava is normal in size with greater than 50%  respiratory variability, suggesting right atrial pressure of 3 mmHg.   IAS/Shunts: No atrial level shunt detected by color flow Doppler.    Nuclear stress test 06/12/2021   Findings are consistent with no prior ischemia and no prior myocardial infarction. The study is low risk.   No ST deviation was noted.   LV perfusion is normal. There is no evidence of ischemia. There is no evidence of infarction.   End diastolic cavity size is normal. End systolic cavity size is normal.   Normal perfusion EF may not be accurate due to afib estimated at 51% no RWMAls noted   Patient Profile     71 y.o. female with history of permanent atrial fibrillation on oral anticoagulants, remote history of tachycardia cardiomyopathy, chronic diastolic heart failure, coronary artery disease status post remote stent to PLV (2009), diabetes mellitus type 2, hypertension, recurrent problems of iron deficiency anemia due to GI bleeding, colonic AVMs, remote history of hemothorax with tamponade in 2020 presented with complaints of fatigue, weakness, slight disorientation and found to have severe anemia, acute kidney injury and hyponatremia leading to hospitalization.   Assessment & Plan    Chronic HFpEF (EF 50-55%)  Patient remains euvolemic appearing, BNP 102 this admission. Home Torsemide and Lisinopril have been on hold with AKI. TTE this  admission shows LVEF 60-65%. Moderately hypertensive with held GDMT. Plan to resume home GDMT upon improved renal function.   AKI Hyponatremia  Patient with symptomatic hyponatremia and AKI on admission. Na improved to 129 today from 122 on 10/8. Hyponatremic symptoms appear to have resolved secondary to PRBC administration. Creatinine improved to 1.23 from a peak of 1.56 on 10/9. Patient's recent baseline appears to be around 1.1. Will continue to hold diuretics, home ACEi inhibitor, and NSAIDs for an additional day. Defer final recs to attending provider.  Iron deficiency anemia  Patient with known colonic AVMs. GI was consulted this admission. They are not planning for endoscopic intervention given lack of obvious/active bleeding. They recommend oral iron replacement in addition to periodic IV supplementation and Q6-8 week iron labs. Patient will need outpatient GI follow-up. Hemoglobin improved to 8.7 this morning after IV iron and 1 unit PRBC. Plan to resume [place] with Eliquis this morning. Anticipate this being short-term therapy until patient can undergo Watchman implantation.  Paroxysmal atrial fibrillation, secondary hypercoagulable state  Patient remains rate controlled with Metoprolol Succinate '100mg'$  and Diltiazem '180mg'$  QD. CHA2DS2-VASc score 6 (age, gender, CHF, HTN, DM, CAD). Patient followed outpatient By Dr. Sallyanne Kuster who also saw her over the weekend, feels that she would be a great candidate for watchman device. Given ongoing anemia issues, agree that this would be a good solution to minimize patient's stroke risk vs bleeding/anemia with longterm OAC. Resume Welch today, Eliquis given slightly reduced GI bleed risk with plans to workup for Watchman.   For questions or updates, please contact [place] Please consult [email or web address] for contact info under        Signed, Lily Kocher, PA-C  05/21/2022, 9:28 AM

## 2022-05-21 NOTE — Telephone Encounter (Signed)
Pharmacy Patient Advocate Encounter  Insurance verification completed.    The patient is insured through [employer] Part D   The patient is currently admitted and ran test claims for the following: Eliquis.  Copays and coinsurance results were relayed to Inpatient clinical team.      

## 2022-05-21 NOTE — Consult Note (Signed)
   [facility] CM Inpatient Consult   05/21/2022  CLARANN HELVEY 1950-11-26 [id number]  [place] [facility] [ACO] Patient:  [employer] Primary Care Provider:  Hoyt Koch, MD, [place] at [facility] a provider that is listed to provide  Patient screened for hospitalization to assess for potential [place] [facility] service needs for post hospital transition.    Met with patient and family member,  who was up in [place], HIPAA verified.  Explained reason for visit for post hospital community follow up needs.  Patient states of being followed by [facility], and sees a provider at least 4 times a year and endorses Dr. Sharlet Salina as the primary care provider. No other needs assessed as review of electronic medical record as discussed in morning progression meeting.  Plan:  No [facility] Community care assessed as she is followed by [facility] for care management. Will sign off.  For questions contact:   Natividad Brood, RN BSN [facility] Liaison  [phone] business mobile phone Toll free office [phone]  Fax number: [phone] [email or web address]

## 2022-05-21 NOTE — Progress Notes (Signed)
   05/21/22 1000  Mobility  Activity Ambulated with assistance in hallway  Activity Response Tolerated well  Distance Ambulated (ft) 230 ft  $Mobility charge 1 Mobility  Level of Assistance Standby assist, set-up cues, supervision of patient - no hands on  Assistive Device Four wheel walker  Mobility Referral Yes   Mobility Specialist Progress Note  Received pt in chair having no complaints and agreeable to mobility. Pt was asymptomatic throughout ambulation and returned to room w/o fault. Left in chair w/ call bell in reach and all needs met.   Lucious Groves Mobility Specialist

## 2022-05-21 NOTE — TOC Benefit Eligibility Note (Signed)
Patient Teacher, English as a foreign language completed.    The patient is currently admitted and upon discharge could be taking Eliquis 5 mg.  The current 30 day co-pay is $27.38.   The patient is insured through [place], [place] Patient Advocate Specialist [place] Patient Advocate Team Direct Number: [phone]  Fax: [phone]

## 2022-05-22 ENCOUNTER — Other Ambulatory Visit: Payer: Self-pay | Admitting: Cardiology

## 2022-05-22 ENCOUNTER — Telehealth: Payer: Self-pay

## 2022-05-22 DIAGNOSIS — D509 Iron deficiency anemia, unspecified: Secondary | ICD-10-CM

## 2022-05-22 DIAGNOSIS — I480 Paroxysmal atrial fibrillation: Secondary | ICD-10-CM | POA: Diagnosis not present

## 2022-05-22 DIAGNOSIS — D500 Iron deficiency anemia secondary to blood loss (chronic): Secondary | ICD-10-CM | POA: Diagnosis not present

## 2022-05-22 DIAGNOSIS — E871 Hypo-osmolality and hyponatremia: Secondary | ICD-10-CM

## 2022-05-22 DIAGNOSIS — N179 Acute kidney failure, unspecified: Secondary | ICD-10-CM | POA: Diagnosis not present

## 2022-05-22 LAB — BASIC METABOLIC PANEL
Anion gap: 9 (ref 5–15)
BUN: 20 mg/dL (ref 8–23)
CO2: 23 mmol/L (ref 22–32)
Calcium: 8.7 mg/dL — ABNORMAL LOW (ref 8.9–10.3)
Chloride: 96 mmol/L — ABNORMAL LOW (ref 98–111)
Creatinine, Ser: 0.99 mg/dL (ref 0.44–1.00)
GFR, Estimated: >60 mL/min (ref >60)
Glucose, Bld: 166 mg/dL — ABNORMAL HIGH (ref 70–99)
Potassium: 4.6 mmol/L (ref 3.5–5.1)
Sodium: 128 mmol/L — ABNORMAL LOW (ref 135–145)

## 2022-05-22 LAB — GLUCOSE, CAPILLARY: Glucose-Capillary: 152 mg/dL — ABNORMAL HIGH (ref 70–99)

## 2022-05-22 LAB — HEMOGLOBIN AND HEMATOCRIT, BLOOD
HCT: 25.5 % — ABNORMAL LOW (ref 36.0–46.0)
Hemoglobin: 8.7 g/dL — ABNORMAL LOW (ref 12.0–15.0)

## 2022-05-22 MED ORDER — TORSEMIDE 20 MG PO TABS
20.0000 mg | ORAL_TABLET | Freq: Every day | ORAL | Status: DC
  Administered 2022-05-22: 20 mg via ORAL
  Filled 2022-05-22: qty 1

## 2022-05-22 MED ORDER — APIXABAN 5 MG PO TABS: 5.0000 mg | ORAL_TABLET | Freq: Two times a day (BID) | ORAL | 5 refills | Status: DC

## 2022-05-22 MED ORDER — LISINOPRIL 20 MG PO TABS
20.0000 mg | ORAL_TABLET | Freq: Every day | ORAL | Status: DC
  Administered 2022-05-22: 20 mg via ORAL
  Filled 2022-05-22: qty 1

## 2022-05-22 NOTE — Discharge Summary (Signed)
Discharge Summary    Patient ID: [name] MRN: [id number]; DOB: 1950/11/16  Admit date: 05/19/2022 Discharge date: 05/22/2022  PCP:  Hoyt Koch, MD   [place] Providers Cardiologist:  Sanda Klein, MD        Discharge Diagnoses    Principal Problem:   Anemia Active Problems:   Hyponatremia   AKI (acute kidney injury) ([place])   Paroxysmal atrial fibrillation ([place])    Diagnostic Studies/Procedures    TTE  IMPRESSIONS     1. Left ventricular ejection fraction, by estimation, is 60 to 65%. The  left ventricle has normal function. The left ventricle has no regional  wall motion abnormalities. Left ventricular diastolic function could not  be evaluated.   2. Right ventricular systolic function is mildly reduced. The right  ventricular size is normal. Tricuspid regurgitation signal is inadequate  for assessing PA pressure.   3. Left atrial size was mildly dilated.   4. The mitral valve is normal in structure. Trivial mitral valve  regurgitation. No evidence of mitral stenosis.   5. The aortic valve is normal in structure. Aortic valve regurgitation is  mild. No aortic stenosis is present.   6. The inferior vena cava is dilated in size with >50% respiratory  variability, suggesting right atrial pressure of 8 mmHg.   FINDINGS   Left Ventricle: Left ventricular ejection fraction, by estimation, is 60  to 65%. The left ventricle has normal function. The left ventricle has no  regional wall motion abnormalities. The left ventricular internal cavity  size was normal in size. There is   no left ventricular hypertrophy. Left ventricular diastolic function  could not be evaluated.   Right Ventricle: The right ventricular size is normal. No increase in  right ventricular wall thickness. Right ventricular systolic function is  mildly reduced. Tricuspid regurgitation signal is inadequate for assessing  PA pressure.   Left Atrium: Left atrial  size was mildly dilated.   Right Atrium: Right atrial size was normal in size.   Pericardium: There is no evidence of pericardial effusion.   Mitral Valve: The mitral valve is normal in structure. Mild mitral annular  calcification. Trivial mitral valve regurgitation. No evidence of mitral  valve stenosis.   Tricuspid Valve: The tricuspid valve is normal in structure. Tricuspid  valve regurgitation is trivial. No evidence of tricuspid stenosis.   Aortic Valve: The aortic valve is normal in structure. Aortic valve  regurgitation is mild. No aortic stenosis is present.   Pulmonic Valve: The pulmonic valve was normal in structure. Pulmonic valve  regurgitation is not visualized. No evidence of pulmonic stenosis.   Aorta: The aortic root is normal in size and structure.   Venous: The inferior vena cava is dilated in size with greater than 50%  respiratory variability, suggesting right atrial pressure of 8 mmHg.   IAS/Shunts: No atrial level shunt detected by color flow Doppler.  _____________   History of Present Illness     [name] is a 71 y.o. female with history of coronary artery disease status post stent to PLV B in 2009, permanent atrial fibrillation, diabetes, hypertension, status post pericardiocentesis for hemothorax with tamponade in 2020, and GI bleed.  Patient admitted for evaluation of anemia and GI bleeding while on Xarelto.  Patient is followed by Dr. Sallyanne Kuster for outpatient cardiology and is also seen in the heart failure clinic.  Patient has a history of normal nuclear stress test, the most recent being in October 2022.  Patient was hospitalized in 2022 secondary to a lower GI bleed with hematochezia, hemoglobin down to 6.  At that time she underwent colonoscopy, upper endoscopy and a capsule study that did not show an obvious source of bleeding.  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  This was managed via electrocautery.   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  Patient struggled with fluid management after her surgery but experienced improvement with diuresis.  She does continue to have symptoms of heart failure class II, becoming short of breath with exertion.  Patient was seen on 10/6 by Dr. Sallyanne Kuster and reported worsening weakness and fatigue along with shortness of breath and some abdominal distention.  She also noted intermittent episodes of hematochezia over the past several days.  Additionally patient reported that she was "less sharp than usual." Outpatient labs were obtained and patient was found with a sodium of 128, hemoglobin of 7.9 with a hematocrit of 25.  Based off of these results, patient was direct admitted to further manage anemia and hyponatremia.  Hospital Course     Consultants: Gastroenterology  Chronic HFpEF  Upon admission, patient was found without obvious signs of hypervolemia.  However creatinine was found elevated to 1.44 and in the setting of AKI, home diuretic and lisinopril held. While weight was up, this appeared to be natural weight gain versus acute fluid accumulation, supported by a BNP of 102 which was improved from previous labs. TTE this admission showed LVEF 60-65%. On day of discharge, patient had improvement in Cr to 0.99 and was resumed on home Torsemide '20mg'$  along with Lisinopril '20mg'$ . Invokana also held, and patient instructed to only resume after follow up labs/confirmation by outpatient follow up provider.  Hyponatremia  Given that patient had symptoms of hyponatremia prior to admission, she was started on fluid restriction with daily sodium trend of 128, 125, 122, 129, 128. Despite no significant change in sodium levels, patient's symptoms of confusion improved and home medicines including Torsemide were resumed.  Repeat BMP ordered for 10/16.  Iron deficiency anemia  Patient received 1 unit PRBCs and an iron infusion on 10/8, with hemoglobin improved to 8.1 and then 8.7 by day of discharge. GI was consulted and they felt that there were not indications for endoscopic intervention given lack of obvious/active bleeding. Per their recs, patient should have oral iron supplementation along with periodic IV iron with Q6-8 week repeat iron panel. IV infusions will be scheduled in outpatient setting by Dr. Sallyanne Kuster. Patient will need outpatient GI follow up.   Paroxysmal afib, secondary hypercoagulable state   Patient remains rate controlled with Metoprolol Succinate '100mg'$  and Diltiazem '180mg'$  QD. CHA2DS2-VASc score 6 (age, gender, CHF, HTN, DM, CAD). Patient followed outpatient By Dr. Sallyanne Kuster who also saw her over the weekend, feels that she would be a great candidate for watchman device given ongoing anemia issues. Patient was switched from Xarelto to Eliquis given slightly lower GI bleed risk profile. Repeat Hgb after initiation was stable. Repeat CBC scheduled for Monday 10/16.       Did the patient have an acute coronary syndrome (MI, NSTEMI, STEMI, etc) this admission?:  No  Did the patient have a percutaneous coronary intervention (stent / angioplasty)?:  No.     Discharge Vitals and physical exam Blood pressure (!) 139/56, pulse 75, temperature 98.2 F (36.8 C), temperature source Oral, resp. rate 19, height '5\' 2"'$  (1.575 m), weight 87.3 kg, SpO2 96 %.  Filed Weights   05/20/22 0516 05/21/22 0442 05/22/22 0234  Weight: 86.3 kg 87 kg 87.3 kg   GEN: No acute distress.   Neck: No JVD Cardiac: irregularly irregular, no murmurs, rubs, or gallops.  Respiratory: Clear to auscultation bilaterally. GI: Soft, nontender, non-distended  MS: No edema; No deformity. Neuro:  Nonfocal  Psych: Normal affect   Labs & Radiologic Studies    CBC Recent Labs    05/19/22 1708  05/20/22 0104 05/21/22 0305 05/22/22 0148  WBC 6.1 8.6  --   --   NEUTROABS 4.6  --   --   --   HGB 8.1* 8.1* 8.7* 8.7*  HCT 24.0* 25.0* 26.5* 25.5*  MCV 78.7* 80.4  --   --   PLT 206 257  --   --    Basic Metabolic Panel Recent Labs    05/21/22 0305 05/22/22 0148  NA 129* 128*  K 4.6 4.6  CL 94* 96*  CO2 25 23  GLUCOSE 163* 166*  BUN 22 20  CREATININE 1.23* 0.99  CALCIUM 9.3 8.7*   Liver Function Tests Recent Labs    05/19/22 1708  AST 22  ALT 16  ALKPHOS 55  BILITOT 0.5  PROT 7.0  ALBUMIN 3.9   No results for input(s): "LIPASE", "AMYLASE" in the last 72 hours. High Sensitivity Troponin:   No results for input(s): "TROPONINIHS" in the last 720 hours.  BNP Invalid input(s): "POCBNP" D-Dimer No results for input(s): "DDIMER" in the last 72 hours. Hemoglobin A1C No results for input(s): "HGBA1C" in the last 72 hours. Fasting Lipid Panel No results for input(s): "CHOL", "HDL", "LDLCALC", "TRIG", "CHOLHDL", "LDLDIRECT" in the last 72 hours. Thyroid Function Tests No results for input(s): "TSH", "T4TOTAL", "T3FREE", "THYROIDAB" in the last 72 hours.  Invalid input(s): "FREET3" _____________  ECHOCARDIOGRAM COMPLETE  Result Date: 05/20/2022    ECHOCARDIOGRAM REPORT   Patient Name:   [name] Date of Exam: 05/20/2022 Medical Rec #:  [id number]      Height:       62.0 in Accession #:    [id number]     Weight:       190.3 lb Date of Birth:  [birth year]      BSA:          1.872 m Patient Age:    75 years       BP:           115/51 mmHg Patient Gender: F              HR:           63 bpm. Exam Location:  Inpatient Procedure: 2D Echo, Color Doppler and Cardiac Doppler Indications:    dyspnea  History:        Patient has prior history of Echocardiogram examinations, most                 recent 12/09/2019. CHF, CAD, COPD, Arrythmias:Atrial                 Fibrillation, Signs/Symptoms:Murmur; Risk Factors:Hypertension,                 Dyslipidemia and Diabetes.  Sonographer:  Johny Chess RDCS Referring Phys: [place]  1. Left ventricular ejection fraction, by estimation, is 60 to 65%. The left ventricle has normal function. The left ventricle has no regional wall motion abnormalities. Left ventricular diastolic function could not be evaluated.  2. Right ventricular systolic function is mildly reduced. The right ventricular size is normal. Tricuspid regurgitation signal is inadequate for assessing PA pressure.  3. Left atrial size was mildly dilated.  4. The mitral valve is normal in structure. Trivial mitral valve regurgitation. No evidence of mitral stenosis.  5. The aortic valve is normal in structure. Aortic valve regurgitation is mild. No aortic stenosis is present.  6. The inferior vena cava is dilated in size with >50% respiratory variability, suggesting right atrial pressure of 8 mmHg. FINDINGS  Left Ventricle: Left ventricular ejection fraction, by estimation, is 60 to 65%. The left ventricle has normal function. The left ventricle has no regional wall motion abnormalities. The left ventricular internal cavity size was normal in size. There is  no left ventricular hypertrophy. Left ventricular diastolic function could not be evaluated. Right Ventricle: The right ventricular size is normal. No increase in right ventricular wall thickness. Right ventricular systolic function is mildly reduced. Tricuspid regurgitation signal is inadequate for assessing PA pressure. Left Atrium: Left atrial size was mildly dilated. Right Atrium: Right atrial size was normal in size. Pericardium: There is no evidence of pericardial effusion. Mitral Valve: The mitral valve is normal in structure. Mild mitral annular calcification. Trivial mitral valve regurgitation. No evidence of mitral valve stenosis. Tricuspid Valve: The tricuspid valve is normal in structure. Tricuspid valve regurgitation is trivial. No evidence of tricuspid stenosis. Aortic Valve: The aortic  valve is normal in structure. Aortic valve regurgitation is mild. No aortic stenosis is present. Pulmonic Valve: The pulmonic valve was normal in structure. Pulmonic valve regurgitation is not visualized. No evidence of pulmonic stenosis. Aorta: The aortic root is normal in size and structure. Venous: The inferior vena cava is dilated in size with greater than 50% respiratory variability, suggesting right atrial pressure of 8 mmHg. IAS/Shunts: No atrial level shunt detected by color flow Doppler.  LEFT VENTRICLE PLAX 2D LVIDd:         4.40 cm LVIDs:         2.60 cm LV PW:         1.00 cm LV IVS:        0.80 cm LVOT diam:     1.80 cm LVOT Area:     2.54 cm  RIGHT VENTRICLE         IVC TAPSE (M-mode): 1.5 cm  IVC diam: 2.20 cm LEFT ATRIUM             Index        RIGHT ATRIUM           Index LA diam:        4.20 cm 2.24 cm/m   RA Area:     14.30 cm LA Vol (A2C):   63.7 ml 34.03 ml/m  RA Volume:   31.10 ml  16.62 ml/m LA Vol (A4C):   81.7 ml 43.65 ml/m LA Biplane Vol: 72.2 ml 38.57 ml/m   AORTA Ao Root diam: 2.20 cm Ao Asc diam:  2.40 cm  SHUNTS Systemic Diam: 1.80 cm Fransico Him MD Electronically signed by Fransico Him MD Signature Date/Time: 05/20/2022/5:08:25 PM    Final    Disposition   Pt is being discharged home today in good  condition.  Follow-up Plans & Appointments     Follow-up Information     Almyra Deforest, [place]. Go on 05/31/2022.   Specialties: Cardiology, Radiology Why: Follow up with one of Dr. Victorino December PAs on 05/31/22 '@10'$ :55 AM. Contact information: [address] [place] [address] [phone]         Labs Follow up on 05/28/2022.   Why: Go to the [facility] office for follow up labs from 8:00am to 3:00pm        Hoyt Koch, MD. Go on 05/29/2022.   Specialty: Internal Medicine Why: '@1'$ :20pm Contact information: [place] [address] [phone]         Gastroenterology, [place]. Call.   Contact information: [address] [place] [address] [phone]                Discharge Instructions     Diet - low sodium heart healthy   Complete by: As directed    Discharge instructions   Complete by: As directed    Please DO NOT take Invokana until you have your follow up with cardiology on 10/19.   Increase activity slowly   Complete by: As directed    Increase activity slowly   Complete by: As directed         Discharge Medications   Allergies as of 05/22/2022       Reactions   Benadryl [diphenhydramine Hcl] Other (See Comments)   Restless leg   Clopidogrel Bisulfate    Other reaction(s): stomach upset   Ioversol Other (See Comments)   Unknown   Benadryl [diphenhydramine] Anxiety   Iodinated Contrast Media Palpitations, Rash   Rapid heart rate, hot Other reaction(s): rapid heartbeat Other reaction(s): flushing        Medication List     STOP taking these medications    celecoxib 100 MG capsule Commonly known as: CeleBREX   Invokana 100 MG Tabs tablet Generic drug: canagliflozin   rivaroxaban 10 MG Tabs tablet Commonly known as: XARELTO       TAKE these medications    Accu-Chek Softclix Lancets lancets Use as instructed   albuterol 108 (90 Base) MCG/ACT inhaler Commonly known as: VENTOLIN HFA Inhale 1 puff into the lungs every 6 (six) hours as needed for wheezing or shortness of breath.   apixaban 5 MG Tabs tablet Commonly known as: ELIQUIS Take 1 tablet (5 mg total) by mouth 2 (two) times daily.   ascorbic acid 1000 MG tablet Commonly known as: VITAMIN C Take 1,000 mg by mouth daily.   CALCIUM CITRATE PLUS/MAGNESIUM PO Take 400 mg by mouth daily.   colestipol 1 g tablet Commonly known as: COLESTID Take 0.5 g by mouth 2 (two) times daily.   diltiazem 180 MG 24 hr capsule Commonly known as: CARDIZEM CD TAKE ONE CAPSULE ONCE DAILY   famotidine 20 MG tablet Commonly known as: PEPCID Take 20 mg by mouth 2 (two) times daily.    FREESTYLE LITE test strip Generic drug: glucose blood Use as instructed   gabapentin 100 MG capsule Commonly known as: NEURONTIN TAKE ONE CAPSULE BY MOUTH THREE TIMES DAILY What changed: when to take this   guaiFENesin 600 MG 12 hr tablet Commonly known as: MUCINEX Take 600 mg by mouth 2 (two) times daily.   HYDROcodone-acetaminophen 5-325 MG tablet Commonly known as: NORCO/VICODIN Take 1 tablet by mouth 2 (two) times daily as needed for moderate pain.   Janumet 50-1000 MG tablet Generic  drug: sitaGLIPtin-metformin Take 1 tablet by mouth 2 (two) times daily with a meal.   latanoprost 0.005 % ophthalmic solution Commonly known as: XALATAN Place 1 drop into both eyes at bedtime.   lisinopril 20 MG tablet Commonly known as: ZESTRIL TAKE ONE TABLET BY MOUTH DAILY   LORazepam 0.5 MG tablet Commonly known as: ATIVAN 1 po bid prn What changed:  how much to take how to take this when to take this additional instructions   metoprolol succinate 100 MG 24 hr tablet Commonly known as: TOPROL-XL TAKE ONE TABLET BY MOUTH TWICE DAILY   metoprolol tartrate 25 MG tablet Commonly known as: LOPRESSOR Take 1 tablet (25 mg total) by mouth daily as needed (if HR increases).   nystatin powder Commonly known as: MYCOSTATIN/NYSTOP Apply 1 application topically once a week. What changed: Another medication with the same name was changed. Make sure you understand how and when to take each.   nystatin ointment Commonly known as: MYCOSTATIN Apply 1 application topically 2 (two) times daily as needed (IRRITATION). What changed: when to take this   PARoxetine 10 MG tablet Commonly known as: PAXIL TAKE 2 AND 1/2 TABLETS BY MOUTH ONCE DAILY What changed: See the new instructions.   potassium chloride 10 MEQ CR capsule Commonly known as: MICRO-K Take 1 capsule (10 mEq total) by mouth daily. What changed: when to take this   rOPINIRole 2 MG 24 hr tablet Commonly known as: REQUIP  XL Take 1 tablet (2 mg total) by mouth at bedtime.   rosuvastatin 10 MG tablet Commonly known as: CRESTOR TAKE ONE TABLET AT BEDTIME. What changed: when to take this   senna 8.6 MG Tabs tablet Commonly known as: SENOKOT Take 1 tablet by mouth at bedtime as needed for mild constipation.   torsemide 20 MG tablet Commonly known as: DEMADEX Take 1 tablet (20 mg total) by mouth daily.   traZODone 100 MG tablet Commonly known as: DESYREL Up to 200 mg at bedtime for sleep What changed:  how much to take how to take this when to take this additional instructions   triamcinolone cream 0.1 % Commonly known as: KENALOG Apply 1 Application topically 2 (two) times daily. What changed:  when to take this reasons to take this   umeclidinium-vilanterol 62.5-25 MCG/ACT Aepb Commonly known as: Anoro Ellipta Inhale 1 puff into the lungs daily.           Outstanding Labs/Studies   CBC/BMP on 10/16 '@[facility]'$ .  Duration of Discharge Encounter   Greater than 30 minutes including physician time.  Delton Coombes, PA-C 05/22/2022, 11:08 AM

## 2022-05-22 NOTE — Progress Notes (Signed)
   05/22/22 0956  Mobility  Activity Ambulated with assistance in hallway  Level of Assistance Standby assist, set-up cues, supervision of patient - no hands on  Assistive Device Four wheel walker  Distance Ambulated (ft) 230 ft  Activity Response Tolerated well  Mobility Referral Yes  $Mobility charge 1 Mobility   Mobility Specialist Progress Note  Received pt in chair having no complaints and agreeable to mobility. Pt was asymptomatic throughout ambulation and returned to room w/o fault. Left in chair w/ call bell in reach and all needs met.   [name] Mobility Specialist

## 2022-05-22 NOTE — Discharge Instructions (Signed)

## 2022-05-22 NOTE — Plan of Care (Signed)
  Problem: Education: Goal: Knowledge of General Education information will improve Description: Including pain rating scale, medication(s)/side effects and non-pharmacologic comfort measures Outcome: Progressing   Problem: Health Behavior/Discharge Planning: Goal: Ability to manage health-related needs will improve Outcome: Progressing   Problem: Pain Managment: Goal: General experience of comfort will improve Outcome: Progressing   Problem: Elimination: Goal: Will not experience complications related to bowel motility Outcome: Progressing

## 2022-05-22 NOTE — Care Management Important Message (Signed)
Important Message  Patient Details  Name: [name] MRN: [id number] Date of Birth: [birth year]   Medicare Important Message Given:  Yes     [name] 05/22/2022, 10:52 AM

## 2022-05-22 NOTE — Progress Notes (Signed)
Pt being d/c, VSS, IV removed, education complete.   Alvis Lemmings, RN 05/22/2022

## 2022-05-22 NOTE — Telephone Encounter (Signed)
Error

## 2022-05-22 NOTE — Progress Notes (Signed)
Follow-up basic metabolic panel and complete blood count following hospital discharge.

## 2022-05-23 ENCOUNTER — Telehealth: Payer: Self-pay

## 2022-05-23 DIAGNOSIS — D509 Iron deficiency anemia, unspecified: Secondary | ICD-10-CM | POA: Diagnosis not present

## 2022-05-23 NOTE — Telephone Encounter (Signed)
-----   Message from Sherren Mocha, MD sent at 05/20/2022 12:15 PM EDT ----- Renaye Rakers will get her in to see one of [place] ----- Message ----- From: Sanda Klein, MD Sent: 05/19/2022   4:56 PM EDT To: Sherren Mocha, MD; Theodoro Parma, RN; #  Please put her on list for Watchman. Permanent atrial fibrillation. Has tolerated long periods of anticoagulation, but this is the second time in two years she is admitted for severe anemia. Was skeptical of Watchman, but open to it now that it has happened again. Will be in the hospital at least through Monday.

## 2022-05-23 NOTE — Telephone Encounter (Signed)
Per Dr. Sallyanne Kuster, called to arrange Watchman consult.  Left message to call back.

## 2022-05-23 NOTE — Telephone Encounter (Signed)
Transition Care Management Follow-up Telephone Call Date of discharge and from where: 05/22/2022 from [facility]  Diagnosis: I50.9 CHF How have you been since you were released from the hospital? "Finally getting some rest, cognitive is much better; appetite is good" Any questions or concerns? Yes  Items Reviewed: Did the pt receive and understand the discharge instructions provided? Yes  Medications obtained and verified? Yes  Other? No  Any new allergies since your discharge? No  Dietary orders reviewed? Yes; Low sodium heart healthy Do you have support at home? Yes ; wife  Home Care and Equipment/Supplies: Were home health services ordered? no If so, what is the name of the agency? no  Has the agency set up a time to come to the patient's home? not applicable Were any new equipment or medical supplies ordered?  No What is the name of the medical supply agency? no Were you able to get the supplies/equipment? not applicable Do you have any questions related to the use of the equipment or supplies? No  Functional Questionnaire: (I = Independent and D = Dependent) ADLs: I  Bathing/Dressing- I  Meal Prep- I  Eating- I  Maintaining continence- I  Transferring/Ambulation- I  Managing Meds- I  Follow up appointments reviewed:  PCP Hospital f/u appt confirmed? Yes  Scheduled to see Pricilla Holm, MD on 05/30/2022 @ 10:40 am. [facility] f/u appt confirmed? Yes  Scheduled to see Almyra Deforest, MD on 05/31/2022 @ 10:55 am. Are transportation arrangements needed? No  If their condition worsens, is the pt aware to call PCP or go to the Emergency Dept.? Yes Was the patient provided with contact information for the PCP's office or ED? Yes Was to pt encouraged to call back with questions or concerns? Yes

## 2022-05-24 NOTE — Telephone Encounter (Signed)
Left message to call back to arrange Watchman consult with Dr. Burt Knack.

## 2022-05-25 ENCOUNTER — Encounter: Payer: Self-pay | Admitting: Internal Medicine

## 2022-05-28 ENCOUNTER — Other Ambulatory Visit: Payer: Self-pay

## 2022-05-28 DIAGNOSIS — E871 Hypo-osmolality and hyponatremia: Secondary | ICD-10-CM

## 2022-05-28 DIAGNOSIS — D509 Iron deficiency anemia, unspecified: Secondary | ICD-10-CM

## 2022-05-28 NOTE — Progress Notes (Unsigned)
No chief complaint on file.   HISTORY OF PRESENT ILLNESS:  05/28/22 ALL:  [name] is a 71 y.o. female here today for follow up for RLS and insomnia. She was last seen by Dr Brett Fairy 11/2021. Ropinirole IR TID was switched to ropinirole XR '2mg'$  QHS. Trazodone '200mg'$  daily continued. Since,   Memory     HISTORY (copied from Dr Dohmeier's previous note)  Rv 11-27-2021; [name] is a patient with RLS and  chronic pain, and has advanced from wheelchair  to walker- she is on many, many medications that will cause hypersomnia, fatigue. Naps often. Gabapentin was not working by itself for RLS .  Pramipexole was not effective.   She reports some daytime restless legs onset when in her recliner, and 90% of RLS manifested at night, in bed.  Pt states again that the prescription for RLS medication at  2 mg ER was denied by insurance. Takes requip 1 mg tab allowed  3 times a day- can we finally allow this patient to go to the XR form?  She has been bothered by RLS, neuropathy and gait disorder.   REVIEW OF SYSTEMS: Out of a complete 14 system review of symptoms, the patient complains only of the following symptoms, and all other reviewed systems are negative.   ALLERGIES: Allergies  Allergen Reactions   Benadryl [Diphenhydramine Hcl] Other (See Comments)    Restless leg   Clopidogrel Bisulfate     Other reaction(s): stomach upset   Ioversol Other (See Comments)    Unknown   Benadryl [Diphenhydramine] Anxiety   Iodinated Contrast Media Palpitations and Rash    Rapid heart rate, hot Other reaction(s): rapid heartbeat  Other reaction(s): flushing     HOME MEDICATIONS: Outpatient Medications Prior to Visit  Medication Sig Dispense Refill   Accu-Chek Softclix Lancets lancets Use as instructed 100 each 12   albuterol (VENTOLIN HFA) 108 (90 Base) MCG/ACT inhaler Inhale 1 puff into the lungs every 6 (six) hours as needed for wheezing or shortness of breath.     apixaban  (ELIQUIS) 5 MG TABS tablet Take 1 tablet (5 mg total) by mouth 2 (two) times daily. 60 tablet 5   ascorbic acid (VITAMIN C) 1000 MG tablet Take 1,000 mg by mouth daily.     colestipol (COLESTID) 1 g tablet Take 0.5 g by mouth 2 (two) times daily.     diltiazem (CARDIZEM CD) 180 MG 24 hr capsule TAKE ONE CAPSULE ONCE DAILY 90 capsule 3   famotidine (PEPCID) 20 MG tablet Take 20 mg by mouth 2 (two) times daily.     gabapentin (NEURONTIN) 100 MG capsule TAKE ONE CAPSULE BY MOUTH THREE TIMES DAILY (Patient taking differently: Take 100 mg by mouth 2 (two) times daily.) 180 capsule 2   glucose blood (FREESTYLE LITE) test strip Use as instructed 100 each 12   guaiFENesin (MUCINEX) 600 MG 12 hr tablet Take 600 mg by mouth 2 (two) times daily.     HYDROcodone-acetaminophen (NORCO/VICODIN) 5-325 MG tablet Take 1 tablet by mouth 2 (two) times daily as needed for moderate pain. 60 tablet 0   latanoprost (XALATAN) 0.005 % ophthalmic solution Place 1 drop into both eyes at bedtime.     lisinopril (ZESTRIL) 20 MG tablet TAKE ONE TABLET BY MOUTH DAILY (Patient taking differently: Take 20 mg by mouth daily.) 90 tablet 1   LORazepam (ATIVAN) 0.5 MG tablet 1 po bid prn (Patient taking differently: Take 0.5 mg by mouth 2 (two)  times daily.) 60 tablet 2   metoprolol succinate (TOPROL-XL) 100 MG 24 hr tablet TAKE ONE TABLET BY MOUTH TWICE DAILY 180 tablet 3   metoprolol tartrate (LOPRESSOR) 25 MG tablet Take 1 tablet (25 mg total) by mouth daily as needed (if HR increases).     Multiple Minerals-Vitamins (CALCIUM CITRATE PLUS/MAGNESIUM PO) Take 400 mg by mouth daily.     nystatin (MYCOSTATIN/NYSTOP) powder Apply 1 application topically once a week. 60 g 6   nystatin ointment (MYCOSTATIN) Apply 1 application topically 2 (two) times daily as needed (IRRITATION). (Patient taking differently: Apply 1 application  topically daily as needed (IRRITATION).) 100 g 6   PARoxetine (PAXIL) 10 MG tablet TAKE 2 AND 1/2 TABLETS BY  MOUTH ONCE DAILY (Patient taking differently: Take 25 mg by mouth daily.) 225 tablet 3   potassium chloride (MICRO-K) 10 MEQ CR capsule Take 1 capsule (10 mEq total) by mouth daily. (Patient taking differently: Take 10 mEq by mouth every other day.) 30 capsule 1   rOPINIRole (REQUIP XL) 2 MG 24 hr tablet Take 1 tablet (2 mg total) by mouth at bedtime. 30 tablet 5   rosuvastatin (CRESTOR) 10 MG tablet TAKE ONE TABLET AT BEDTIME. (Patient taking differently: Take 10 mg by mouth every evening.) 90 tablet 3   senna (SENOKOT) 8.6 MG TABS Take 1 tablet by mouth at bedtime as needed for mild constipation.     sitaGLIPtin-metformin (JANUMET) 50-1000 MG tablet Take 1 tablet by mouth 2 (two) times daily with a meal. 60 tablet 0   torsemide (DEMADEX) 20 MG tablet Take 1 tablet (20 mg total) by mouth daily. 360 tablet 0   traZODone (DESYREL) 100 MG tablet Up to 200 mg at bedtime for sleep (Patient taking differently: Take 150 mg by mouth at bedtime.) 180 tablet 3   triamcinolone cream (KENALOG) 0.1 % Apply 1 Application topically 2 (two) times daily. (Patient taking differently: Apply 1 Application topically 2 (two) times daily as needed (Rash).) 100 g 0   umeclidinium-vilanterol (ANORO ELLIPTA) 62.5-25 MCG/ACT AEPB Inhale 1 puff into the lungs daily. 60 each 12   No facility-administered medications prior to visit.     PAST MEDICAL HISTORY: Past Medical History:  Diagnosis Date   Anemia, unspecified    Arthritis    Asthma    Atrial fibrillation with RVR ([place])    a. on Xarelto   Bell's palsy    Facial nerve decompression in 2001   CHF (congestive heart failure) (HCC)    Chronic low back pain    COPD with asthma    Coronary artery disease    Myoview 04/12/11 was entirely normal. ECHO 02/26/08 showed only minor abnormalities. Stenting 05/26/08 of her posterolateral branch to the left circumflex coronary artery. Used a 2.5x56m Taxus Monorail stent.myoview 2014 was without ischemia   Diabetes mellitus     Type 2   Early cataracts, bilateral    Fatty liver    GERD (gastroesophageal reflux disease)    Glaucoma    Glaucoma    Goiter    Heart murmur    History of nuclear stress test 2012; 2014   lexiscan; normal pattern of perfusion; normal, low risk scan    Hyperlipidemia    Hypertension    Panic disorder    Pneumonia 2008   Polycystic ovary disease    Hysterectomy in 1982 for this   Shortness of breath dyspnea    ECHO 02/26/08 showed only minor abnormalities   Spinal stenosis  PAST SURGICAL HISTORY: Past Surgical History:  Procedure Laterality Date   ABDOMINAL HYSTERECTOMY  1982   & BSO; for polycystic ovary disease   CARDIOVERSION N/A 12/17/2013   Procedure: CARDIOVERSION;  Surgeon: Pixie Casino, MD;  Location: [facility] ENDOSCOPY;  Service: Cardiovascular;  Laterality: N/A;   CARDIOVERSION N/A 09/30/2015   Procedure: CARDIOVERSION;  Surgeon: Dorothy Spark, MD;  Location: [facility] ENDOSCOPY;  Service: Cardiovascular;  Laterality: N/A;   CARDIOVERSION N/A 06/22/2016   Procedure: CARDIOVERSION;  Surgeon: Skeet Latch, MD;  Location: [facility] ENDOSCOPY;  Service: Cardiovascular;  Laterality: N/A;   CENTRAL LINE INSERTION  01/13/2019   Procedure: CENTRAL LINE INSERTION;  Surgeon: Jolaine Artist, MD;  Location: [place] CV LAB;  Service: Cardiovascular;;   COLONOSCOPY     last 2009; Dr Cristina Gong; due 2019   COLONOSCOPY WITH PROPOFOL N/A 01/23/2018   Procedure: COLONOSCOPY WITH PROPOFOL;  Surgeon: Ronald Lobo, MD;  Location: [place];  Service: Endoscopy;  Laterality: N/A;   COLONOSCOPY WITH PROPOFOL N/A 11/04/2020   Procedure: COLONOSCOPY WITH PROPOFOL;  Surgeon: Ronald Lobo, MD;  Location: [place];  Service: Endoscopy;  Laterality: N/A;   CORONARY ANGIOPLASTY  05/26/2008   Stenting of her posterolateral branch to the left circumflex coronary artery. Used a 2.5x98m Taxus Monorail stent.   ESOPHAGOGASTRODUODENOSCOPY (EGD) WITH PROPOFOL N/A 01/23/2018   Procedure:  ESOPHAGOGASTRODUODENOSCOPY (EGD) WITH PROPOFOL;  Surgeon: BRonald Lobo MD;  [place]  Service: Endoscopy;  Laterality: N/A;   ESOPHAGOGASTRODUODENOSCOPY (EGD) WITH PROPOFOL N/A 11/04/2020   Procedure: ESOPHAGOGASTRODUODENOSCOPY (EGD) WITH PROPOFOL;  Surgeon: BRonald Lobo MD;  [place]  Service: Endoscopy;  Laterality: N/A;   FACIAL NERVE DECOMPRESSION  2001/2002   bells palsy    GIVENS CAPSULE STUDY N/A 11/04/2020   Procedure: GIVENS CAPSULE STUDY;  Surgeon: BRonald Lobo MD;  [place]  Service: Endoscopy;  Laterality: N/A;   HOT HEMOSTASIS N/A 11/04/2020   Procedure: HOT HEMOSTASIS (ARGON PLASMA COAGULATION/BICAP);  Surgeon: BRonald Lobo MD;  [facility]  Service: Endoscopy;  Laterality: N/A;   LAPAROSCOPIC CHOLECYSTECTOMY  06/15/2011    Dr IDalbert Batman  PERICARDIOCENTESIS N/A 01/13/2019   Procedure: PERICARDIOCENTESIS;  Surgeon: MBurnell Blanks MD;  [place] [facility];  Service: Cardiovascular;  Laterality: N/A;   POLYPECTOMY  11/04/2020   Procedure: POLYPECTOMY;  Surgeon: BRonald Lobo MD;  [place]  Service: Endoscopy;;   RIGHT AND LEFT HEART CATH N/A 01/13/2019   Procedure: RIGHT AND LEFT HEART CATH;  Surgeon: BJolaine Artist MD;  [place];  Service: Cardiovascular;  Laterality: N/A;   TEE WITHOUT CARDIOVERSION N/A 12/17/2013   Procedure: TRANSESOPHAGEAL ECHOCARDIOGRAM (TEE);  Surgeon: KPixie Casino MD;  [facility]  Service: Cardiovascular;  Laterality: N/A;  trish/ja   TOTAL HIP ARTHROPLASTY Left 09/25/2021   Procedure: LEFT TOTAL HIP ARTHROPLASTY ANTERIOR APPROACH;  Surgeon: RFrederik Pear MD;  [facility];  Service: Orthopedics;  Laterality: Left;   TRANSTHORACIC ECHOCARDIOGRAM  76/26/9485  LV systolic function normal with mild conc LVH; LA mildly dilated; trace MR/TR   UPPER GI ENDOSCOPY  2009   negative     FAMILY HISTORY: Family History   Problem Relation Age of Onset   Heart attack Father 482      2nd MI at 618  Colon cancer Brother 729  Gout Brother    Ulcers Mother    Emphysema Mother 772  Colon polyps Sister    Cancer Sister  Basal cell carcinoma   Pneumonia Maternal Grandmother    Hypertension Brother    Hyperlipidemia Brother    Cancer Brother        Skin   Diabetes Neg Hx    Stroke Neg Hx      SOCIAL HISTORY: Social History   Socioeconomic History   Marital status: Married    Spouse name: Not on file   Number of children: 2   Years of education: master's   Highest education level: Not on file  Occupational History   Occupation: Electrical engineer    Employer: [employer]  Tobacco Use   Smoking status: Former    Packs/day: 1.50    Years: 30.00    Total pack years: 45.00    Types: Cigarettes    Quit date: 08/13/1993    Years since quitting: 28.8   Smokeless tobacco: Never  Vaping Use   Vaping Use: Never used  Substance and Sexual Activity   Alcohol use: No   Drug use: No   Sexual activity: Not on file  Other Topics Concern   Not on file  Social History Narrative   ** Merged History Encounter **       Social Determinants of Health   Financial Resource Strain: Not on file  Food Insecurity: No Food Insecurity (05/20/2022)   Hunger Vital Sign    Worried About Running Out of Food in the Last Year: Never true    [place] in the Last Year: Never true  Transportation Needs: No Transportation Needs (05/20/2022)   PRAPARE - Hydrologist (Medical): No    Lack of Transportation (Non-Medical): No  Physical Activity: Not on file  Stress: Not on file  Social Connections: Not on file  Intimate Partner Violence: Not At Risk (05/20/2022)   Humiliation, Afraid, Rape, and Kick questionnaire    Fear of Current or Ex-Partner: No    Emotionally Abused: No    Physically Abused: No    Sexually Abused: No     PHYSICAL EXAM  There were no vitals  filed for this visit. There is no height or weight on file to calculate BMI.  Generalized: Well developed, in no acute distress  Cardiology: normal rate and rhythm, no murmur auscultated  Respiratory: clear to auscultation bilaterally    Neurological examination  Mentation: Alert oriented to time, place, history taking. Follows all commands speech and language fluent Cranial nerve II-XII: Pupils were equal round reactive to light. Extraocular movements were full, visual field were full on confrontational test. Facial sensation and strength were normal. Uvula tongue midline. Head turning and shoulder shrug  were normal and symmetric. Motor: The motor testing reveals 5 over 5 strength of all 4 extremities. Good symmetric motor tone is noted throughout.  Sensory: Sensory testing is intact to soft touch on all 4 extremities. No evidence of extinction is noted.  Coordination: Cerebellar testing reveals good finger-nose-finger and heel-to-shin bilaterally.  Gait and station: Gait is normal. Tandem gait is normal. Romberg is negative. No drift is seen.  Reflexes: Deep tendon reflexes are symmetric and normal bilaterally.    DIAGNOSTIC DATA (LABS, IMAGING, TESTING) - I reviewed patient records, labs, notes, testing and imaging myself where available.  Lab Results  Component Value Date   WBC 8.6 05/20/2022   HGB 8.7 (L) 05/22/2022   HCT 25.5 (L) 05/22/2022   MCV 80.4 05/20/2022   PLT 257 05/20/2022      Component Value Date/Time  NA 128 (L) 05/22/2022 0148   NA 128 (L) 05/18/2022 1623   K 4.6 05/22/2022 0148   CL 96 (L) 05/22/2022 0148   CO2 23 05/22/2022 0148   GLUCOSE 166 (H) 05/22/2022 0148   BUN 20 05/22/2022 0148   BUN 25 05/18/2022 1623   CREATININE 0.99 05/22/2022 0148   CREATININE 1.26 (H) 03/14/2020 1617   CALCIUM 8.7 (L) 05/22/2022 0148   PROT 7.0 05/19/2022 1708   PROT 7.7 11/17/2019 1534   ALBUMIN 3.9 05/19/2022 1708   ALBUMIN 4.4 11/17/2019 1534   AST 22 05/19/2022  1708   ALT 16 05/19/2022 1708   ALKPHOS 55 05/19/2022 1708   BILITOT 0.5 05/19/2022 1708   BILITOT 0.4 11/17/2019 1534   GFRNONAA >60 05/22/2022 0148   GFRAA 42 (L) 03/08/2020 1207   Lab Results  Component Value Date   CHOL 110 05/18/2022   HDL 44 05/18/2022   LDLCALC 33 05/18/2022   LDLDIRECT 143.4 01/23/2008   TRIG 208 (H) 05/18/2022   CHOLHDL 2.5 05/18/2022   Lab Results  Component Value Date   HGBA1C 7.0 (H) 05/18/2022   Lab Results  Component Value Date   VITAMINB12 1,179 (H) 05/19/2022   Lab Results  Component Value Date   TSH 1.920 03/01/2021        No data to display              03/05/2019    1:56 PM  Montreal Cognitive Assessment   Visuospatial/ Executive (0/5) 4  Naming (0/3) 3  Attention: Read list of digits (0/2) 2  Attention: Read list of letters (0/1) 1  Attention: Serial 7 subtraction starting at 100 (0/3) 3  Language: Repeat phrase (0/2) 2  Language : Fluency (0/1) 1  Abstraction (0/2) 2  Delayed Recall (0/5) 5  Orientation (0/6) 6  Total 29     ASSESSMENT AND PLAN  71 y.o. year old female  has a past medical history of Anemia, unspecified, Arthritis, Asthma, Atrial fibrillation with RVR ([place]), Bell's palsy, CHF (congestive heart failure) ([place]), Chronic low back pain, COPD with asthma, Coronary artery disease, Diabetes mellitus, Early cataracts, bilateral, Fatty liver, GERD (gastroesophageal reflux disease), Glaucoma, Glaucoma, Goiter, Heart murmur, History of nuclear stress test (2012; 2014), Hyperlipidemia, Hypertension, Panic disorder, Pneumonia (2008), Polycystic ovary disease, Shortness of breath dyspnea, and Spinal stenosis. here with    No diagnosis found.  [name] ***.  Healthy lifestyle habits encouraged. *** will follow up with PCP as directed. *** will return to see me in ***, sooner if needed. *** verbalizes understanding and agreement with this plan.   No orders of the defined types were placed in this  encounter.    No orders of the defined types were placed in this encounter.    Debbora Presto, MSN, FNP-C 05/28/2022, 1:53 PM  [facility] [address] [place], [place] [address] [phone]

## 2022-05-29 ENCOUNTER — Inpatient Hospital Stay: Payer: HMO | Admitting: Internal Medicine

## 2022-05-29 ENCOUNTER — Ambulatory Visit: Payer: HMO | Admitting: Family Medicine

## 2022-05-29 ENCOUNTER — Encounter: Payer: Self-pay | Admitting: Family Medicine

## 2022-05-29 VITALS — BP 144/80 | HR 87 | Ht 62.0 in | Wt 189.0 lb

## 2022-05-29 DIAGNOSIS — R413 Other amnesia: Secondary | ICD-10-CM

## 2022-05-29 DIAGNOSIS — G2581 Restless legs syndrome: Secondary | ICD-10-CM | POA: Diagnosis not present

## 2022-05-29 DIAGNOSIS — G4700 Insomnia, unspecified: Secondary | ICD-10-CM

## 2022-05-29 LAB — CBC
Hematocrit: 30.3 % — ABNORMAL LOW (ref 34.0–46.6)
Hemoglobin: 9.6 g/dL — ABNORMAL LOW (ref 11.1–15.9)
MCH: 27.1 pg (ref 26.6–33.0)
MCHC: 31.7 g/dL (ref 31.5–35.7)
MCV: 86 fL (ref 79–97)
Platelets: 203 10*3/uL (ref 150–450)
RBC: 3.54 x10E6/uL — ABNORMAL LOW (ref 3.77–5.28)
RDW: 16.9 % — ABNORMAL HIGH (ref 11.7–15.4)
WBC: 6.7 10*3/uL (ref 3.4–10.8)

## 2022-05-29 LAB — BASIC METABOLIC PANEL
BUN/Creatinine Ratio: 21 (ref 12–28)
BUN: 26 mg/dL (ref 8–27)
CO2: 23 mmol/L (ref 20–29)
Calcium: 9.1 mg/dL (ref 8.7–10.3)
Chloride: 93 mmol/L — ABNORMAL LOW (ref 96–106)
Creatinine, Ser: 1.22 mg/dL — ABNORMAL HIGH (ref 0.57–1.00)
Glucose: 208 mg/dL — ABNORMAL HIGH (ref 70–99)
Potassium: 4.8 mmol/L (ref 3.5–5.2)
Sodium: 128 mmol/L — ABNORMAL LOW (ref 134–144)
eGFR: 47 mL/min/{1.73_m2} — ABNORMAL LOW (ref >59)

## 2022-05-29 MED ORDER — ROPINIROLE HCL ER 2 MG PO TB24: 2.0000 mg | ORAL_TABLET | Freq: Every day | ORAL | 3 refills | Status: DC

## 2022-05-29 MED ORDER — TRAZODONE HCL 100 MG PO TABS: ORAL_TABLET | ORAL | 3 refills | Status: DC

## 2022-05-29 NOTE — Patient Instructions (Addendum)
Below is our plan:  We will continue ropinirole XL '2mg'$  and trazodone 150-'200mg'$  daily. Check to make sure you are not taking Lasix at this time. Follow up with PCP tomorrow.   Please make sure you are staying well hydrated. I recommend 50-60 ounces daily. Well balanced diet and regular exercise encouraged. Consistent sleep schedule with 6-8 hours recommended.   Please continue follow up with care team as directed.   Follow up with Dr Brett Fairy in 6 months  You may receive a survey regarding today's visit. I encourage you to leave honest feed back as I do use this information to improve patient care. Thank you for seeing me today!   Management of Memory Problems   There are some general things you can do to help manage your memory problems.  Your memory may not in fact recover, but by using techniques and strategies you will be able to manage your memory difficulties better.   1)  Establish a routine. Try to establish and then stick to a regular routine.  By doing this, you will get used to what to expect and you will reduce the need to rely on your memory.  Also, try to do things at the same time of day, such as taking your medication or checking your calendar first thing in the morning. Think about think that you can do as a part of a regular routine and make a list.  Then enter them into a daily planner to remind you.  This will help you establish a routine.   2)  Organize your environment. Organize your environment so that it is uncluttered.  Decrease visual stimulation.  Place everyday items such as keys or cell phone in the same place every day (ie.  Basket next to front door) Use post it notes with a brief message to yourself (ie. Turn off light, lock the door) Use labels to indicate where things go (ie. Which cupboards are for food, dishes, etc.) Keep a notepad and pen by the telephone to take messages   3)  Memory Aids A diary or journal/notebook/daily planner Making a list  (shopping list, chore list, to do list that needs to be done) Using an alarm as a reminder (kitchen timer or cell phone alarm) Using cell phone to store information (Notes, Calendar, Reminders) Calendar/White board placed in a prominent position Post-it notes   In order for memory aids to be useful, you need to have good habits.  It's no good remembering to make a note in your journal if you don't remember to look in it.  Try setting aside a certain time of day to look in journal.   4)  Improving mood and managing fatigue. There may be other factors that contribute to memory difficulties.  Factors, such as anxiety, depression and tiredness can affect memory. Regular gentle exercise can help improve your mood and give you more energy. Exercise: there are short videos created by the [employer] on Health specially for older adults: https://bit.ly/2I30q97.  Mediterranean diet: which emphasizes fruits, vegetables, whole grains, legumes, fish, and other seafood; unsaturated fats such as olive oils; and low amounts of red meat, eggs, and sweets. A variation of this, called MIND ([place] Intervention for Neurodegenerative Delay) incorporates the DASH (Dietary Approaches to Stop Hypertension) diet, which has been shown to lower high blood pressure, a risk factor for Alzheimer's disease. More information at: RepublicForum.gl.  Aerobic exercise that improve heart health is also good for the mind.  [employer] on  Aging have short videos for exercises that you can do at home: GoldCloset.com.ee Simple relaxation techniques may help relieve symptoms of anxiety Try to get back to completing activities or hobbies you enjoyed doing in the past. Learn to pace yourself through activities to decrease fatigue. Find out about some local support groups where you can share experiences with others. Try and achieve 7-8  hours of sleep at night.

## 2022-05-29 NOTE — Telephone Encounter (Signed)
Left message to call back  

## 2022-05-30 ENCOUNTER — Telehealth: Payer: Self-pay | Admitting: Pharmacy Technician

## 2022-05-30 ENCOUNTER — Ambulatory Visit (INDEPENDENT_AMBULATORY_CARE_PROVIDER_SITE_OTHER): Payer: HMO | Admitting: Internal Medicine

## 2022-05-30 ENCOUNTER — Other Ambulatory Visit: Payer: Self-pay | Admitting: *Deleted

## 2022-05-30 ENCOUNTER — Encounter: Payer: Self-pay | Admitting: Internal Medicine

## 2022-05-30 VITALS — BP 128/80 | HR 75 | Temp 98.2°F | Ht 62.0 in | Wt 175.3 lb

## 2022-05-30 DIAGNOSIS — M1612 Unilateral primary osteoarthritis, left hip: Secondary | ICD-10-CM

## 2022-05-30 DIAGNOSIS — I4821 Permanent atrial fibrillation: Secondary | ICD-10-CM

## 2022-05-30 DIAGNOSIS — D500 Iron deficiency anemia secondary to blood loss (chronic): Secondary | ICD-10-CM | POA: Diagnosis not present

## 2022-05-30 DIAGNOSIS — E871 Hypo-osmolality and hyponatremia: Secondary | ICD-10-CM

## 2022-05-30 DIAGNOSIS — Z23 Encounter for immunization: Secondary | ICD-10-CM

## 2022-05-30 NOTE — Telephone Encounter (Signed)
Dr. Sharlet Salina, Juluis Rainier note:   Auth Submission: NO AUTH NEEDED Payer: [email or web address] advt Medication & CPT/J Code(s) submitted: Feraheme (ferumoxytol) [id number] Route of submission (phone, fax, portal):  Phone # Fax # Auth type: Buy/Bill Units/visits requested: x2 Reference number: [place]-T 05/30/22 '@3'$ :32p 2nd verify: [email or web address]-R   Approval from: 05/30/22 to 08/12/22   Patient will be scheduled as soon as possible.

## 2022-05-30 NOTE — Patient Instructions (Signed)
We have ordered the iron infusion and will recheck the blood counts about 6-8 weeks after that. If you have dark stools or bleeding before then let us know and we will recheck sooner.

## 2022-05-30 NOTE — Progress Notes (Unsigned)
   Subjective:   Patient ID: [name], female    DOB: [birth year], 71 y.o.   MRN: [id number]  HPI The patient is a 71 YO female coming in for hospital follow up (in for acute GI bleeding, got blood transfusion and iron transfusion). D/C note states cardiology will arrange second iron transfusion but she was told to call us for this. The type of iron infusion is not mentioned in D/C note or primary service notes. Found upon extensive chart review in GI note she had feraheme.   PMH, [facility], social history reviewed and updated.   Review of Systems  Constitutional:  Positive for activity change, appetite change and fatigue.  HENT: Negative.    Eyes: Negative.   Respiratory:  Negative for cough, chest tightness and shortness of breath.   Cardiovascular:  Negative for chest pain, palpitations and leg swelling.  Gastrointestinal:  Negative for abdominal distention, abdominal pain, constipation, diarrhea, nausea and vomiting.  Musculoskeletal: Negative.   Skin: Negative.   Neurological:  Positive for weakness.  Psychiatric/Behavioral: Negative.      Objective:  Physical Exam Constitutional:      Appearance: She is well-developed. She is obese. She is ill-appearing.  HENT:     Head: Normocephalic and atraumatic.  Cardiovascular:     Rate and Rhythm: Normal rate and regular rhythm.  Pulmonary:     Effort: Pulmonary effort is normal. No respiratory distress.     Breath sounds: Normal breath sounds. No wheezing or rales.  Abdominal:     General: Bowel sounds are normal. There is no distension.     Palpations: Abdomen is soft.     Tenderness: There is no abdominal tenderness. There is no rebound.  Musculoskeletal:     Cervical back: Normal range of motion.  Skin:    General: Skin is warm and dry.  Neurological:     Mental Status: She is alert and oriented to person, place, and time.     Coordination: Coordination normal.     Vitals:   05/30/22 1058  BP: 128/80  Pulse: 75  Temp:  98.2 F (36.8 C)  TempSrc: Oral  SpO2: 95%  Weight: 175 lb 5 oz (79.5 kg)  Height: '5\' 2"'$  (1.575 m)   Flu shot given at visit Assessment & Plan:  Visit time 35 minutes in face to face communication with patient and coordination of care, additional 15 minutes spent in record review, coordination or care, ordering tests, communicating/referring to other healthcare professionals, documenting in medical records all on the same day of the visit for total time 50 minutes spent on the visit.

## 2022-05-31 ENCOUNTER — Ambulatory Visit: Payer: HMO | Attending: Physician Assistant | Admitting: Physician Assistant

## 2022-05-31 ENCOUNTER — Other Ambulatory Visit: Payer: Self-pay

## 2022-05-31 ENCOUNTER — Other Ambulatory Visit: Payer: Self-pay | Admitting: Pharmacy Technician

## 2022-05-31 VITALS — BP 140/66 | HR 79 | Ht 62.0 in | Wt 189.0 lb

## 2022-05-31 DIAGNOSIS — I2510 Atherosclerotic heart disease of native coronary artery without angina pectoris: Secondary | ICD-10-CM | POA: Diagnosis not present

## 2022-05-31 DIAGNOSIS — E119 Type 2 diabetes mellitus without complications: Secondary | ICD-10-CM

## 2022-05-31 DIAGNOSIS — I10 Essential (primary) hypertension: Secondary | ICD-10-CM

## 2022-05-31 DIAGNOSIS — E1169 Type 2 diabetes mellitus with other specified complication: Secondary | ICD-10-CM | POA: Diagnosis not present

## 2022-05-31 DIAGNOSIS — E871 Hypo-osmolality and hyponatremia: Secondary | ICD-10-CM

## 2022-05-31 DIAGNOSIS — I4821 Permanent atrial fibrillation: Secondary | ICD-10-CM

## 2022-05-31 DIAGNOSIS — D649 Anemia, unspecified: Secondary | ICD-10-CM

## 2022-05-31 DIAGNOSIS — E785 Hyperlipidemia, unspecified: Secondary | ICD-10-CM

## 2022-05-31 MED ORDER — POTASSIUM CHLORIDE ER 10 MEQ PO CPCR: 10.0000 meq | ORAL_CAPSULE | ORAL | 1 refills | Status: DC | PRN

## 2022-05-31 MED ORDER — TORSEMIDE 20 MG PO TABS: 20.0000 mg | ORAL_TABLET | ORAL | 0 refills | Status: DC | PRN

## 2022-05-31 NOTE — Assessment & Plan Note (Signed)
Per D/C note cardiology was to order infusion but then they told her to have [place] arrange this. We are happy to do so. Had to review all notes from hospital to find name of iron infusion (listed in GI note). Ordered same so she can complete series. Hg improved on labs checked this week will not repeat. Will repeat about 3-4 weeks after second infusion with CBC and ferritin with expectation of full improvement after 6-8 weeks of iron infusion. No clinical signs of bleeding at visit.

## 2022-05-31 NOTE — Telephone Encounter (Signed)
Left message to call back  

## 2022-05-31 NOTE — Telephone Encounter (Signed)
Just for clarification this is a request for 1 only (see orders). Patient already received 1 dose in hospital while admitted

## 2022-05-31 NOTE — Assessment & Plan Note (Signed)
Taking eliquis 5 mg BID and getting labs with cardiology tomorrow will not draw today. No clinical signs of bleeding.

## 2022-05-31 NOTE — Progress Notes (Signed)
Cardiology Office Note:    Date:  06/02/2022   ID:  [name], DOB [birth year], MRN [id number]  PCP:  Hoyt Koch, MD   [place] [facility] Cardiologist:  Sanda Klein, MD     Referring MD: Hoyt Koch, *   Chief Complaint  Patient presents with   Follow-up   Edema    Legs.   Shortness of Breath    History of Present Illness:    [name] is a 71 y.o. female with a hx of CAD, permanent atrial fibrillation, DM II, HTN, HLD, tamponade and COPD.  She is a former patient of Dr. Melvern Banker.  She is a retired Electrical engineer.  Dr. Melvern Banker stented her distal left circumflex posterolateral branch in October 2009, there was some residual disease in the PDA branch.  EF was normal.  Due to her recurrent chest discomfort, cardiac catheterization was performed in September 2012 revealing a patent stent, 50% distal PDA lesion and normal EF.  Myoview in August 2016 was low risk. Echocardiogram obtained on 07/13/2016 showed EF 60 to 65%, mild MR. Echocardiogram in April 2020 showed EF 55 to 60%, mild biatrial enlargement, no pericardial effusion.  She had repeat echocardiogram 79-monthlater on 01/12/2019 that showed EF 55 to 60%, large pericardial effusion.  She underwent pericardiocentesis for hemothorax with tamponade while on treatment with oral anticoagulation.  Repeat echocardiogram in June 2020 after pericardiocentesis shows no further pericardial effusion.  Echocardiogram in April 2021 shows EF 50 to 55%, moderate biatrial enlargement.  Myoview in October 2022 was low risk without evidence of ischemia or prior infarction.  She was previously hospitalized in 2022 with lower GI bleed, hemoglobin was as low as 6.  Work-up including colonoscopy, EGD and eventually capsule endoscopy did not reveal clear source of bleeding, however there was a couple of AVMs in the colon, one in the cecum that appears to be friable, this was treated with electrocautery.  She has been  on blood thinner since without bleeding issue.    Patient was last seen by Dr. CSallyanne Kusteron 05/18/2022 for worsening weakness, fatigue and dyspnea despite increasing her torsemide to 40 mg daily.  She was being considered for Watchman device as she was felt to be a poor candidate for long-term oral anticoagulant.  Unfortunately lab work obtained on the same day shows her creatinine jumped to 1.44, sodium 128 and hemoglobin dropped down to 7.9 from the previous 11.4.  Patient was directly admitted to the hospital.  She stayed in the hospital from 10/7 until 10/10 due to anemia and a GI bleed while on Xarelto.  Echocardiogram obtained during this admission showed EF 60 to 65%.  Home diuretic and lisinopril were held, creatinine improved to 0.99 by the time of discharge.  Sodium level remained low during the hospitalization.  Torsemide was resumed at the home dose.  Patient received 1 unit of packed red blood cell transfusion and iron infusion.  GI was consulted who felt there was no indication for endoscopic intervention given lack of obvious and active bleeding.  Per GI recommendation, patient should have oral iron supplement along with periodic iron infusion every 6 to 8 weeks and repeat iron panel.  It appears patient was seen by her PCP yesterday who has ordered iron infusion.  Xarelto was switched to Eliquis given slightly lower GI bleeding risk profile.   Since discharge from the hospital, patient has been doing fairly well.  Sodium level remain low.  Her PCP  has ordered iron infusion yesterday.  She is tolerating Eliquis without obvious sign of bleeding.  I discussed her case with Dr. Sallyanne Kuster, from our standpoint, the previous recommendation of proceeding with Watchman device is still unchanged.  Patient wished to finish Watchman device this year if possible.  Otherwise, she has no significant lower extremity edema, orthopnea or PND.  She denies any significant chest pain.  Although she has changed her  torsemide to as needed, she is still taking the potassium, I will change the potassium supplement to as needed as well.    Past Medical History:  Diagnosis Date   Anemia, unspecified    Arthritis    Asthma    Atrial fibrillation with RVR ([place])    a. on Xarelto   Bell's palsy    Facial nerve decompression in 2001   CHF (congestive heart failure) (HCC)    Chronic low back pain    COPD with asthma    Coronary artery disease    Myoview 04/12/11 was entirely normal. ECHO 02/26/08 showed only minor abnormalities. Stenting 05/26/08 of her posterolateral branch to the left circumflex coronary artery. Used a 2.5x76m Taxus Monorail stent.myoview 2014 was without ischemia   Diabetes mellitus    Type 2   Early cataracts, bilateral    Fatty liver    GERD (gastroesophageal reflux disease)    Glaucoma    Glaucoma    Goiter    Heart murmur    History of nuclear stress test 2012; 2014   lexiscan; normal pattern of perfusion; normal, low risk scan    Hyperlipidemia    Hypertension    Panic disorder    Pneumonia 2008   Polycystic ovary disease    Hysterectomy in 1982 for this   Shortness of breath dyspnea    ECHO 02/26/08 showed only minor abnormalities   Spinal stenosis     Past Surgical History:  Procedure Laterality Date   ABDOMINAL HYSTERECTOMY  1982   & BSO; for polycystic ovary disease   CARDIOVERSION N/A 12/17/2013   Procedure: CARDIOVERSION;  Surgeon: KPixie Casino MD;  [facility] [facility]  Service: Cardiovascular;  Laterality: N/A;   CARDIOVERSION N/A 09/30/2015   Procedure: CARDIOVERSION;  Surgeon: KDorothy Spark MD;  [facility]  Service: Cardiovascular;  Laterality: N/A;   CARDIOVERSION N/A 06/22/2016   Procedure: CARDIOVERSION;  Surgeon: TSkeet Latch MD;  [place] [facility]  Service: Cardiovascular;  Laterality: N/A;   CENTRAL LINE INSERTION  01/13/2019   Procedure: CENTRAL LINE INSERTION;  Surgeon: BJolaine Artist MD;  [facility];  Service: Cardiovascular;;   COLONOSCOPY     last 2009; Dr BCristina Gong due 2019   COLONOSCOPY WITH PROPOFOL N/A 01/23/2018   Procedure: COLONOSCOPY WITH PROPOFOL;  Surgeon: BRonald Lobo MD;  [place] [facility]  Service: Endoscopy;  Laterality: N/A;   COLONOSCOPY WITH PROPOFOL N/A 11/04/2020   Procedure: COLONOSCOPY WITH PROPOFOL;  Surgeon: BRonald Lobo MD;  [place] [facility]  Service: Endoscopy;  Laterality: N/A;   CORONARY ANGIOPLASTY  05/26/2008   Stenting of her posterolateral branch to the left circumflex coronary artery. Used a 2.5x134mTaxus Monorail stent.   ESOPHAGOGASTRODUODENOSCOPY (EGD) WITH PROPOFOL N/A 01/23/2018   Procedure: ESOPHAGOGASTRODUODENOSCOPY (EGD) WITH PROPOFOL;  Surgeon: BuRonald LoboMD;  [facility] [facility] Service: Endoscopy;  Laterality: N/A;   ESOPHAGOGASTRODUODENOSCOPY (EGD) WITH PROPOFOL N/A 11/04/2020   Procedure: ESOPHAGOGASTRODUODENOSCOPY (EGD) WITH PROPOFOL;  Surgeon: BuRonald LoboMD;  [place] [facility] Service: Endoscopy;  Laterality: N/A;  FACIAL NERVE DECOMPRESSION  2001/2002   bells palsy    GIVENS CAPSULE STUDY N/A 11/04/2020   Procedure: GIVENS CAPSULE STUDY;  Surgeon: Ronald Lobo, MD;  Location: [place];  Service: Endoscopy;  Laterality: N/A;   HOT HEMOSTASIS N/A 11/04/2020   Procedure: HOT HEMOSTASIS (ARGON PLASMA COAGULATION/BICAP);  Surgeon: Ronald Lobo, MD;  [facility] END[facility];  Service: Endoscopy;  Laterality: N/A;   LAPAROSCOPIC CHOLECYSTECTOMY  06/15/2011    Dr Dalbert Batman   PERICARDIOCENTESIS N/A 01/13/2019   Procedure: PERICARDIOCENTESIS;  Surgeon: Burnell Blanks, MD;  Location: [place] [facility];  Service: Cardiovascular;  Laterality: N/A;   POLYPECTOMY  11/04/2020   Procedure: POLYPECTOMY;  Surgeon: Ronald Lobo, MD;  Location: [place];  Service: Endoscopy;;   RIGHT AND LEFT HEART CATH N/A 01/13/2019   Procedure: RIGHT AND LEFT HEART CATH;  Surgeon: Jolaine Artist, MD;  Location: [place] [facility];  Service: Cardiovascular;  Laterality: N/A;   TEE WITHOUT CARDIOVERSION N/A 12/17/2013   Procedure: TRANSESOPHAGEAL ECHOCARDIOGRAM (TEE);  Surgeon: Pixie Casino, MD;  [facility];  Service: Cardiovascular;  Laterality: N/A;  trish/ja   TOTAL HIP ARTHROPLASTY Left 09/25/2021   Procedure: LEFT TOTAL HIP ARTHROPLASTY ANTERIOR APPROACH;  Surgeon: Frederik Pear, MD;  [facility];  Service: Orthopedics;  Laterality: Left;   TRANSTHORACIC ECHOCARDIOGRAM  4/40/1027   LV systolic function normal with mild conc LVH; LA mildly dilated; trace MR/TR   UPPER GI ENDOSCOPY  2009   negative    Current Medications: Current Meds  Medication Sig   Accu-Chek Softclix Lancets lancets Use as instructed   albuterol (VENTOLIN HFA) 108 (90 Base) MCG/ACT inhaler Inhale 1 puff into the lungs every 6 (six) hours as needed for wheezing or shortness of breath.   apixaban (ELIQUIS) 5 MG TABS tablet Take 1 tablet (5 mg total) by mouth 2 (two) times daily.   ascorbic acid (VITAMIN C) 1000 MG tablet Take 1,000 mg by mouth daily.   colestipol (COLESTID) 1 g tablet Take 0.5 g by mouth 2 (two) times daily.   diltiazem (CARDIZEM CD) 180 MG 24 hr capsule TAKE ONE CAPSULE ONCE DAILY   famotidine (PEPCID) 20 MG tablet Take 20 mg by mouth 2 (two) times daily.   gabapentin (NEURONTIN) 100 MG capsule TAKE ONE CAPSULE BY MOUTH THREE TIMES DAILY (Patient taking differently: Take 100 mg by mouth 2 (two) times daily.)   glucose blood (FREESTYLE LITE) test strip Use as instructed   guaiFENesin (MUCINEX) 600 MG 12 hr tablet Take 600 mg by mouth 2 (two) times daily.   HYDROcodone-acetaminophen (NORCO/VICODIN) 5-325 MG tablet Take 1 tablet by mouth 2 (two) times daily as needed for moderate pain.   latanoprost (XALATAN) 0.005 % ophthalmic solution Place 1 drop into both eyes at bedtime.   lisinopril (ZESTRIL) 20 MG tablet TAKE ONE TABLET BY MOUTH DAILY (Patient taking differently: Take 20  mg by mouth daily.)   LORazepam (ATIVAN) 0.5 MG tablet 1 po bid prn (Patient taking differently: Take 0.5 mg by mouth 2 (two) times daily.)   metoprolol succinate (TOPROL-XL) 100 MG 24 hr tablet TAKE ONE TABLET BY MOUTH TWICE DAILY   metoprolol tartrate (LOPRESSOR) 25 MG tablet Take 1 tablet (25 mg total) by mouth daily as needed (if HR increases).   Multiple Minerals-Vitamins (CALCIUM CITRATE PLUS/MAGNESIUM PO) Take 400 mg by mouth daily.   nystatin (MYCOSTATIN/NYSTOP) powder Apply 1 application topically once a week.   nystatin ointment (MYCOSTATIN) Apply 1 application topically 2 (two) times  daily as needed (IRRITATION). (Patient taking differently: Apply 1 application  topically daily as needed (IRRITATION).)   PARoxetine (PAXIL) 10 MG tablet TAKE 2 AND 1/2 TABLETS BY MOUTH ONCE DAILY (Patient taking differently: Take 25 mg by mouth daily.)   rOPINIRole (REQUIP XL) 2 MG 24 hr tablet Take 1 tablet (2 mg total) by mouth at bedtime.   rosuvastatin (CRESTOR) 10 MG tablet TAKE ONE TABLET AT BEDTIME. (Patient taking differently: Take 10 mg by mouth every evening.)   senna (SENOKOT) 8.6 MG TABS Take 1 tablet by mouth at bedtime as needed for mild constipation.   sitaGLIPtin-metformin (JANUMET) 50-1000 MG tablet Take 1 tablet by mouth 2 (two) times daily with a meal.   traZODone (DESYREL) 100 MG tablet Up to 200 mg at bedtime for sleep   triamcinolone cream (KENALOG) 0.1 % Apply 1 Application topically 2 (two) times daily. (Patient taking differently: Apply 1 Application topically 2 (two) times daily as needed (Rash).)   umeclidinium-vilanterol (ANORO ELLIPTA) 62.5-25 MCG/ACT AEPB Inhale 1 puff into the lungs daily.   [DISCONTINUED] potassium chloride (MICRO-K) 10 MEQ CR capsule Take 1 capsule (10 mEq total) by mouth daily. (Patient taking differently: Take 10 mEq by mouth every other day.)   [DISCONTINUED] torsemide (DEMADEX) 20 MG tablet Take 1 tablet (20 mg total) by mouth daily.     Allergies:    Benadryl [diphenhydramine hcl], Clopidogrel bisulfate, Ioversol, Benadryl [diphenhydramine], and Iodinated contrast media   Social History   Socioeconomic History   Marital status: Married    Spouse name: Not on file   Number of children: 2   Years of education: master's   Highest education level: Not on file  Occupational History   Occupation: Electrical engineer    Employer: [employer]  Tobacco Use   Smoking status: Former    Packs/day: 1.50    Years: 30.00    Total pack years: 45.00    Types: Cigarettes    Quit date: 08/13/1993    Years since quitting: 28.8   Smokeless tobacco: Never  Vaping Use   Vaping Use: Never used  Substance and Sexual Activity   Alcohol use: No   Drug use: No   Sexual activity: Not on file  Other Topics Concern   Not on file  Social History Narrative   ** Merged History Encounter **       Social Determinants of Health   Financial Resource Strain: Not on file  Food Insecurity: No Food Insecurity (05/20/2022)   Hunger Vital Sign    Worried About Running Out of Food in the Last Year: Never true    Ran Out of Food in the Last Year: Never true  Transportation Needs: No Transportation Needs (05/20/2022)   PRAPARE - Hydrologist (Medical): No    Lack of Transportation (Non-Medical): No  Physical Activity: Not on file  Stress: Not on file  Social Connections: Not on file     Family History: The patient's family history includes Cancer in her brother and sister; Colon cancer (age of onset: 67) in her brother; Colon polyps in her sister; Emphysema (age of onset: 35) in her mother; Gout in her brother; Heart attack (age of onset: 34) in her father; Hyperlipidemia in her brother; Hypertension in her brother; Pneumonia in her maternal grandmother; Ulcers in her mother. There is no history of Diabetes or Stroke.  ROS:   Please see the history of present illness.     All other  systems reviewed and are  negative.  EKGs/Labs/Other Studies Reviewed:    The following studies were reviewed today:  Echo 05/20/2022  1. Left ventricular ejection fraction, by estimation, is 60 to 65%. The  left ventricle has normal function. The left ventricle has no regional  wall motion abnormalities. Left ventricular diastolic function could not  be evaluated.   2. Right ventricular systolic function is mildly reduced. The right  ventricular size is normal. Tricuspid regurgitation signal is inadequate  for assessing PA pressure.   3. Left atrial size was mildly dilated.   4. The mitral valve is normal in structure. Trivial mitral valve  regurgitation. No evidence of mitral stenosis.   5. The aortic valve is normal in structure. Aortic valve regurgitation is  mild. No aortic stenosis is present.   6. The inferior vena cava is dilated in size with >50% respiratory  variability, suggesting right atrial pressure of 8 mmHg.   EKG:  EKG is not ordered today.  Recent Labs: 10/18/2021: Pro B Natriuretic peptide (BNP) 108.0 05/18/2022: BNP 102.2; Magnesium 1.8 05/19/2022: ALT 16 05/28/2022: BUN 26; Creatinine, Ser 1.22; Hemoglobin 9.6; Platelets 203; Potassium 4.8; Sodium 128  Recent Lipid Panel    Component Value Date/Time   CHOL 110 05/18/2022 1623   TRIG 208 (H) 05/18/2022 1623   HDL 44 05/18/2022 1623   CHOLHDL 2.5 05/18/2022 1623   CHOLHDL 3 09/23/2020 1601   VLDL 30.0 09/23/2020 1601   LDLCALC 33 05/18/2022 1623   LDLDIRECT 143.4 01/23/2008 1316     Risk Assessment/Calculations:    CHA2DS2-VASc Score = 6   This indicates a 9.7% annual risk of stroke. The patient's score is based upon: CHF History: 1 HTN History: 1 Diabetes History: 1 Stroke History: 0 Vascular Disease History: 1 Age Score: 1 Gender Score: 1          Physical Exam:    VS:  BP (!) 140/66 (BP Location: Left Arm, Patient Position: Sitting, Cuff Size: Normal)   Pulse 79   Ht '5\' 2"'$  (1.575 m)   Wt 189 lb (85.7 kg)   BMI  34.57 kg/m        Wt Readings from Last 3 Encounters:  05/31/22 189 lb (85.7 kg)  05/30/22 175 lb 5 oz (79.5 kg)  05/29/22 189 lb (85.7 kg)     GEN:  Well nourished, well developed in no acute distress HEENT: Normal NECK: No JVD; No carotid bruits LYMPHATICS: No lymphadenopathy CARDIAC: Irregularly irregular, no murmurs, rubs, gallops RESPIRATORY:  Clear to auscultation without rales, wheezing or rhonchi  ABDOMEN: Soft, non-tender, non-distended MUSCULOSKELETAL:  No edema; No deformity  SKIN: Warm and dry NEUROLOGIC:  Alert and oriented x 3 PSYCHIATRIC:  Normal affect   ASSESSMENT:    1. Coronary artery disease involving native coronary artery of native heart without angina pectoris   2. Permanent atrial fibrillation ([place])   3. Essential hypertension   4. Hyperlipidemia associated with type 2 diabetes mellitus ([place])   5. Controlled type 2 diabetes mellitus without complication, without long-term current use of insulin ([place])   6. Anemia, unspecified type    PLAN:    In order of problems listed above:  CAD: Denies any chest discomfort.  Recent dyspnea and fatigue likely related to severe anemia.  Not on aspirin given the need for Eliquis  Permanent atrial fibrillation: On Eliquis, diltiazem and metoprolol  Hypertension: Blood pressure fairly controlled  Hyperlipidemia: On Crestor  DM2: Managed by primary care provider  Anemia: Likely related  to GI bleed.  PCP has ordered iron infusion.    Cardiac Rehabilitation Eligibility Assessment  The patient is ready to start cardiac rehabilitation from a cardiac standpoint.          Medication Adjustments/Labs and Tests Ordered: Current medicines are reviewed at length with the patient today.  Concerns regarding medicines are outlined above.  No orders of the defined types were placed in this encounter.  Meds ordered this encounter  Medications   torsemide (DEMADEX) 20 MG tablet    Sig: Take 1 tablet (20 mg total)  by mouth as needed (swelling and/or weight gain).    Dispense:  30 tablet    Refill:  0    New Rx   potassium chloride (MICRO-K) 10 MEQ CR capsule    Sig: Take 1 capsule (10 mEq total) by mouth as needed (swelling and/or weight gain).    Dispense:  30 capsule    Refill:  1    Patient Instructions  Medication Instructions:  TAKE Torsemide as needed for swelling of lower extremities and weight gain of 3 lbs overnight and 5 lbs in a week. TAKE Potassium as needed when Torsemide is taken.  *If you need a refill on your cardiac medications before your next appointment, please call your pharmacy*  Lab Work: NONE ordered at this time of appointment   If you have labs (blood work) drawn today and your tests are completely normal, you will receive your results only by: [place] (if you have MyChart) OR A paper copy in the mail If you have any lab test that is abnormal or we need to change your treatment, we will call you to review the results.  Testing/Procedures: NONE ordered at this time of appointment   Follow-Up: At [facility], you and your health needs are our priority.  As part of our continuing mission to provide you with exceptional heart care, we have created designated Provider Care Teams.  These Care Teams include your primary Cardiologist (physician) and Advanced Practice Providers (APPs -  Physician Assistants and Nurse Practitioners) who all work together to provide you with the care you need, when you need it.  Your next appointment:   Follow up as previously scheduled    The format for your next appointment:   In Person  Provider:   Sanda Klein, MD     Other Instructions Medication samples have been provided to the patient.   Drug name: Eliquis (Apixaban)       Strength: 5 mg        Qty: 4 boxes                  LOT: [id number]                        Exp.Date: May 2025     Important Information About Sugar         Hilbert Corrigan, [place]   06/02/2022 10:54 PM    [place]

## 2022-05-31 NOTE — Telephone Encounter (Signed)
Dr. Sharlet Salina. Noted. Thanks. Maudie Mercury

## 2022-05-31 NOTE — Patient Instructions (Addendum)
Medication Instructions:  TAKE Torsemide as needed for swelling of lower extremities and weight gain of 3 lbs overnight and 5 lbs in a week. TAKE Potassium as needed when Torsemide is taken.  *If you need a refill on your cardiac medications before your next appointment, please call your pharmacy*  Lab Work: NONE ordered at this time of appointment   If you have labs (blood work) drawn today and your tests are completely normal, you will receive your results only by: [place] (if you have MyChart) OR A paper copy in the mail If you have any lab test that is abnormal or we need to change your treatment, we will call you to review the results.  Testing/Procedures: NONE ordered at this time of appointment   Follow-Up: At [facility], you and your health needs are our priority.  As part of our continuing mission to provide you with exceptional heart care, we have created designated Provider Care Teams.  These Care Teams include your primary Cardiologist (physician) and Advanced Practice Providers (APPs -  Physician Assistants and Nurse Practitioners) who all work together to provide you with the care you need, when you need it.  Your next appointment:   Follow up as previously scheduled    The format for your next appointment:   In Person  Provider:   Sanda Klein, MD     Other Instructions Medication samples have been provided to the patient.   Drug name: Eliquis (Apixaban)       Strength: 5 mg        Qty: 4 boxes                  LOT: [id number]                        Exp.Date: May 2025     Important Information About Sugar

## 2022-05-31 NOTE — Assessment & Plan Note (Signed)
She does understand that taking celebrex causes increased risk of bleeding. We have discussed previously and reviewed during visit.

## 2022-06-01 LAB — SODIUM, URINE, RANDOM: Sodium, Ur: <20 mmol/L

## 2022-06-01 LAB — CREATININE, URINE, RANDOM: Creatinine, Urine: 29.1 mg/dL

## 2022-06-02 ENCOUNTER — Encounter: Payer: Self-pay | Admitting: Physician Assistant

## 2022-06-04 NOTE — Telephone Encounter (Signed)
Scheduled the patient for Watchman consult with Dr. Burt Knack on 06/27/2022. She was grateful for call and agreed with plan.

## 2022-06-06 ENCOUNTER — Ambulatory Visit (INDEPENDENT_AMBULATORY_CARE_PROVIDER_SITE_OTHER): Payer: HMO | Admitting: *Deleted

## 2022-06-06 VITALS — BP 141/74 | HR 91 | Temp 98.3°F | Resp 16 | Ht 62.0 in | Wt 192.0 lb

## 2022-06-06 DIAGNOSIS — D500 Iron deficiency anemia secondary to blood loss (chronic): Secondary | ICD-10-CM

## 2022-06-06 MED ORDER — ACETAMINOPHEN 325 MG PO TABS: 650.0000 mg | ORAL_TABLET | Freq: Once | ORAL | Status: DC

## 2022-06-06 MED ORDER — SODIUM CHLORIDE 0.9 % IV SOLN
510.0000 mg | Freq: Once | INTRAVENOUS | Status: AC
  Administered 2022-06-06: 510 mg via INTRAVENOUS
  Filled 2022-06-06: qty 17

## 2022-06-06 NOTE — Progress Notes (Signed)
Diagnosis: Iron Deficiency Anemia  Provider:  Marshell Garfinkel MD  Procedure: Infusion  IV Type: Peripheral, IV Location: L Hand  Feraheme (Ferumoxytol), Dose: 510 mg  Infusion Start Time: 6578 pm  Infusion Stop Time: 1539 pm  Post Infusion IV Care: Observation period completed and Peripheral IV Discontinued  Discharge: Condition: Good, Destination: Home . AVS provided to patient.   Performed by:  Oren Beckmann, RN

## 2022-06-13 ENCOUNTER — Ambulatory Visit: Payer: HMO | Admitting: Podiatry

## 2022-06-15 ENCOUNTER — Encounter (HOSPITAL_COMMUNITY): Payer: Self-pay

## 2022-06-18 ENCOUNTER — Encounter: Payer: Self-pay | Admitting: Internal Medicine

## 2022-06-18 DIAGNOSIS — E1159 Type 2 diabetes mellitus with other circulatory complications: Secondary | ICD-10-CM

## 2022-06-20 ENCOUNTER — Other Ambulatory Visit (INDEPENDENT_AMBULATORY_CARE_PROVIDER_SITE_OTHER): Payer: HMO

## 2022-06-20 DIAGNOSIS — E1159 Type 2 diabetes mellitus with other circulatory complications: Secondary | ICD-10-CM | POA: Diagnosis not present

## 2022-06-20 LAB — HEMOGLOBIN A1C: Hgb A1c MFr Bld: 6.8 % — ABNORMAL HIGH (ref 4.6–6.5)

## 2022-06-20 LAB — CBC WITH DIFFERENTIAL/PLATELET
Basophils Absolute: 0 10*3/uL (ref 0.0–0.1)
Basophils Relative: 0.6 % (ref 0.0–3.0)
Eosinophils Absolute: 0.2 10*3/uL (ref 0.0–0.7)
Eosinophils Relative: 1.9 % (ref 0.0–5.0)
HCT: 32.8 % — ABNORMAL LOW (ref 36.0–46.0)
Hemoglobin: 10.8 g/dL — ABNORMAL LOW (ref 12.0–15.0)
Lymphocytes Relative: 10.9 % — ABNORMAL LOW (ref 12.0–46.0)
Lymphs Abs: 0.9 10*3/uL (ref 0.7–4.0)
MCHC: 33 g/dL (ref 30.0–36.0)
MCV: 85.1 fl (ref 78.0–100.0)
Monocytes Absolute: 0.6 10*3/uL (ref 0.1–1.0)
Monocytes Relative: 7.6 % (ref 3.0–12.0)
Neutro Abs: 6.6 10*3/uL (ref 1.4–7.7)
Neutrophils Relative %: 79 % — ABNORMAL HIGH (ref 43.0–77.0)
Platelets: 178 10*3/uL (ref 150.0–400.0)
RBC: 3.86 Mil/uL — ABNORMAL LOW (ref 3.87–5.11)
RDW: 22.3 % — ABNORMAL HIGH (ref 11.5–15.5)
WBC: 8.3 10*3/uL (ref 4.0–10.5)

## 2022-06-20 LAB — BASIC METABOLIC PANEL
BUN: 13 mg/dL (ref 6–23)
CO2: 26 mEq/L (ref 19–32)
Calcium: 9 mg/dL (ref 8.4–10.5)
Chloride: 96 mEq/L (ref 96–112)
Creatinine, Ser: 0.92 mg/dL (ref 0.40–1.20)
GFR: 61.7 mL/min (ref >60.00)
Glucose, Bld: 181 mg/dL — ABNORMAL HIGH (ref 70–99)
Potassium: 4.8 mEq/L (ref 3.5–5.1)
Sodium: 127 mEq/L — ABNORMAL LOW (ref 135–145)

## 2022-06-20 LAB — LIPID PANEL
Cholesterol: 101 mg/dL (ref 0–200)
HDL: 41.3 mg/dL (ref >39.00)
LDL Cholesterol: 25 mg/dL (ref 0–99)
NonHDL: 59.35
Total CHOL/HDL Ratio: 2
Triglycerides: 171 mg/dL — ABNORMAL HIGH (ref 0.0–149.0)
VLDL: 34.2 mg/dL (ref 0.0–40.0)

## 2022-06-20 LAB — MICROALBUMIN / CREATININE URINE RATIO
Creatinine,U: 46.2 mg/dL
Microalb Creat Ratio: 47.7 mg/g — ABNORMAL HIGH (ref 0.0–30.0)
Microalb, Ur: 22 mg/dL — ABNORMAL HIGH (ref 0.0–1.9)

## 2022-06-20 LAB — HEPATIC FUNCTION PANEL
ALT: 16 U/L (ref 0–35)
AST: 22 U/L (ref 0–37)
Albumin: 4 g/dL (ref 3.5–5.2)
Alkaline Phosphatase: 53 U/L (ref 39–117)
Bilirubin, Direct: 0.1 mg/dL (ref 0.0–0.3)
Total Bilirubin: 0.3 mg/dL (ref 0.2–1.2)
Total Protein: 6.9 g/dL (ref 6.0–8.3)

## 2022-06-20 LAB — TSH: TSH: 2.04 u[IU]/mL (ref 0.35–5.50)

## 2022-06-27 ENCOUNTER — Encounter: Payer: Self-pay | Admitting: Cardiovascular Disease

## 2022-06-27 ENCOUNTER — Ambulatory Visit: Payer: HMO | Attending: Cardiovascular Disease | Admitting: Cardiovascular Disease

## 2022-06-27 VITALS — BP 138/60 | HR 80 | Ht 62.0 in | Wt 193.0 lb

## 2022-06-27 DIAGNOSIS — I4821 Permanent atrial fibrillation: Secondary | ICD-10-CM

## 2022-06-27 MED ORDER — DIPHENHYDRAMINE HCL 50 MG PO TABS: ORAL_TABLET | ORAL | 0 refills | Status: DC

## 2022-06-27 MED ORDER — PREDNISONE 50 MG PO TABS: ORAL_TABLET | ORAL | 0 refills | Status: DC

## 2022-06-27 NOTE — Patient Instructions (Addendum)
Medication Instructions:  Contrast allergy treatment for CT (prednisone and benadryl) Your physician recommends that you continue on your current medications as directed. Please refer to the Current Medication list given to you today.  *If you need a refill on your cardiac medications before your next appointment, please call your pharmacy*   Lab Work: NONE If you have labs (blood work) drawn today and your tests are completely normal, you will receive your results only by: [place] (if you have MyChart) OR A paper copy in the mail If you have any lab test that is abnormal or we need to change your treatment, we will call you to review the results.   Testing/Procedures: Watchman CT Your physician has requested that you have cardiac CT. Cardiac computed tomography (CT) is a painless test that uses an x-ray machine to take clear, detailed pictures of your heart. For further information please visit [email or web address]. Please follow instruction sheet as given.  Follow-Up: At [facility], you and your health needs are our priority.  As part of our continuing mission to provide you with exceptional heart care, we have created designated Provider Care Teams.  These Care Teams include your primary Cardiologist (physician) and Advanced Practice Providers (APPs -  Physician Assistants and Nurse Practitioners) who all work together to provide you with the care you need, when you need it.  Your next appointment:   Structural team will follow-up  The format for your next appointment:   In Person  Provider:   Sherren Mocha, MD    Other Instructions WATCHMAN CT INSTRUCTIONS: Your WATCHMAN CT will be scheduled at [place] will be called to confirm date and time.  The day of your CT appointment, please enter [facility] through the [employer] [facility] Entrance (Entrance C off [facility] You may use the FREE valet parking offered at entrance C (encouraged  to control the heart rate for the test). Check in at the large round desk through the glass doors 30 minutes prior to your scheduled appointment.   Please follow these instructions carefully:  On the Night Before the Test: Be sure to Drink plenty of water. Do not consume any caffeinated/decaffeinated beverages or chocolate 12 hours prior to your test. Do not take any antihistamines 12 hours prior to your test. If the patient has contrast allergy:  Patient will need a prescription for Prednisone and very clear instructions (as follows): Prednisone 50 mg - take 13 hours prior to test Take another Prednisone 50 mg 7 hours prior to test Take another Prednisone 50 mg 1 hour prior to test Take Benadryl 50 mg 1 hour prior to test Patient must complete all four doses of above prophylactic medications. Patient will need a ride after test due to Benadryl.  On the Day of the Test: You may take your regular medications prior to the test.  Take metoprolol (Lopressor) two hours prior to test, take additional as needed '25mg'$  dose along with regular if HR greater than 75bpm  HOLD Torsemide morning of the test. FEMALES- please wear underwire-free bra if available      After the Test: Drink plenty of water. After receiving IV contrast, you may experience a mild flushed feeling. This is normal. On occasion, you may experience a mild rash up to 24 hours after the test. This is not dangerous. If this occurs, you can take Benadryl 25 mg and increase your fluid intake. If you experience trouble breathing, this can be serious. If it is  severe call 911 IMMEDIATELY. If it is mild, please call our office. If you take any of these medications: Glipizide/Metformin, Avandament, Glucavance, please do not take 48 hours after completing test unless otherwise instructed.  Once we have confirmed authorization from your insurance company, you will be called to set up a date and time for your test.   For non-scheduling  related questions/concerns about your CT scan, please contact the cardiac imaging nurses: Marchia Bond, Cardiac Imaging Nurse Navigator Gordy Clement, Cardiac Imaging Nurse [facility] Direct Office Dial: [phone]   For scheduling needs, including cancellations and rescheduling, please call [place], [phone].  Lenice Llamas, the Watchman Nurse Navigator, will call you after your CT once the [facility] Team has reviewed your imaging for an update on proceedings. [place]'s direct number is [phone] if you need assistance.   Important Information About Sugar

## 2022-06-27 NOTE — Progress Notes (Signed)
Watchman Consult Note   Date:  06/27/2022   ID:  [name], DOB [birth year], MRN [id number]  PCP:  Hoyt Koch, MD  Cardiologist:  Dr Sallyanne Kuster Primary Electrophysiologist: none Referring Physician: Dr Sallyanne Kuster   CC: to discuss Watchman implant    History of Present Illness: [name] is a 71 y.o. female referred by Dr Sallyanne Kuster for evaluation of atrial fibrillation and stroke prevention. She has longstanding persistent atrial fibrillation.  The patient has been evaluated by their referring physician and is felt to be a poor candidate for long term [place] due to chronic anemia, history of bleeding with spontaneous hemopericardium and hemothorax.  She also had lower GI bleeding in 2022.  There is no clear source of bleeding on upper endoscopy, colonoscopy, and capsule endoscopy.  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  In October of this year she was found to have progressive anemia and was hospitalized from October 7 through October 10.  She received 1 unit of packed red blood cells as well as an iron infusion.  GI was consulted and did not recommend repeat endoscopic procedures.  Rivaroxaban was discontinued and she was switched to apixaban.  Recent hemoglobin was improved.  Today, she denies symptoms of palpitations, chest pain, shortness of breath, orthopnea, PND, lower extremity edema, claudication, dizziness, presyncope, syncope, bleeding, or neurologic sequela. The patient is tolerating medications without difficulties and is otherwise without complaint today.  The patient is done a lot of research on watchman implantation and reports some reluctance about the procedure.   Past Medical History:  Diagnosis Date   Anemia, unspecified    Arthritis    Asthma    Atrial fibrillation with RVR ([place])    a. on Xarelto   Bell's palsy    Facial nerve decompression in 2001   CHF (congestive heart failure) (HCC)    Chronic low back  pain    COPD with asthma    Coronary artery disease    Myoview 04/12/11 was entirely normal. ECHO 02/26/08 showed only minor abnormalities. Stenting 05/26/08 of her posterolateral branch to the left circumflex coronary artery. Used a 2.5x28m Taxus Monorail stent.myoview 2014 was without ischemia   Diabetes mellitus    Type 2   Early cataracts, bilateral    Fatty liver    GERD (gastroesophageal reflux disease)    Glaucoma    Glaucoma    Goiter    Heart murmur    History of nuclear stress test 2012; 2014   lexiscan; normal pattern of perfusion; normal, low risk scan    Hyperlipidemia    Hypertension    Panic disorder    Pneumonia 2008   Polycystic ovary disease    Hysterectomy in 1982 for this   Shortness of breath dyspnea    ECHO 02/26/08 showed only minor abnormalities   Spinal stenosis    Past Surgical History:  Procedure Laterality Date   ABDOMINAL HYSTERECTOMY  1982   & BSO; for polycystic ovary disease   CARDIOVERSION N/A 12/17/2013   Procedure: CARDIOVERSION;  Surgeon: KPixie Casino MD;  [facility]  Service: Cardiovascular;  Laterality: N/A;   CARDIOVERSION N/A 09/30/2015   Procedure: CARDIOVERSION;  Surgeon: KDorothy Spark MD;  [facility]  Service: Cardiovascular;  Laterality: N/A;   CARDIOVERSION N/A 06/22/2016   Procedure: CARDIOVERSION;  Surgeon: TSkeet Latch MD;  [facility]  Service: Cardiovascular;  Laterality: N/A;   CENTRAL LINE INSERTION  01/13/2019  Procedure: CENTRAL LINE INSERTION;  Surgeon: Jolaine Artist, MD;  Location: [place] [facility];  Service: Cardiovascular;;   COLONOSCOPY     last 2009; Dr Cristina Gong; due 2019   COLONOSCOPY WITH PROPOFOL N/A 01/23/2018   Procedure: COLONOSCOPY WITH PROPOFOL;  Surgeon: Ronald Lobo, MD;  Location: [place];  Service: Endoscopy;  Laterality: N/A;   COLONOSCOPY WITH PROPOFOL N/A 11/04/2020   Procedure: COLONOSCOPY WITH PROPOFOL;  Surgeon: Ronald Lobo, MD;   Location: [place];  Service: Endoscopy;  Laterality: N/A;   CORONARY ANGIOPLASTY  05/26/2008   Stenting of her posterolateral branch to the left circumflex coronary artery. Used a 2.5x58m Taxus Monorail stent.   ESOPHAGOGASTRODUODENOSCOPY (EGD) WITH PROPOFOL N/A 01/23/2018   Procedure: ESOPHAGOGASTRODUODENOSCOPY (EGD) WITH PROPOFOL;  Surgeon: BRonald Lobo MD;  [facility]  Service: Endoscopy;  Laterality: N/A;   ESOPHAGOGASTRODUODENOSCOPY (EGD) WITH PROPOFOL N/A 11/04/2020   Procedure: ESOPHAGOGASTRODUODENOSCOPY (EGD) WITH PROPOFOL;  Surgeon: BRonald Lobo MD;  [facility] [facility]  Service: Endoscopy;  Laterality: N/A;   FACIAL NERVE DECOMPRESSION  2001/2002   bells palsy    GIVENS CAPSULE STUDY N/A 11/04/2020   Procedure: GIVENS CAPSULE STUDY;  Surgeon: BRonald Lobo MD;  [place] [facility]  Service: Endoscopy;  Laterality: N/A;   HOT HEMOSTASIS N/A 11/04/2020   Procedure: HOT HEMOSTASIS (ARGON PLASMA COAGULATION/BICAP);  Surgeon: BRonald Lobo MD;  [facility]  Service: Endoscopy;  Laterality: N/A;   LAPAROSCOPIC CHOLECYSTECTOMY  06/15/2011    Dr IDalbert Batman  PERICARDIOCENTESIS N/A 01/13/2019   Procedure: PERICARDIOCENTESIS;  Surgeon: MBurnell Blanks MD;  [facility];  Service: Cardiovascular;  Laterality: N/A;   POLYPECTOMY  11/04/2020   Procedure: POLYPECTOMY;  Surgeon: BRonald Lobo MD;  [place]  Service: Endoscopy;;   RIGHT AND LEFT HEART CATH N/A 01/13/2019   Procedure: RIGHT AND LEFT HEART CATH;  Surgeon: BJolaine Artist MD;  [facility];  Service: Cardiovascular;  Laterality: N/A;   TEE WITHOUT CARDIOVERSION N/A 12/17/2013   Procedure: TRANSESOPHAGEAL ECHOCARDIOGRAM (TEE);  Surgeon: KPixie Casino MD;  [facility]  Service: Cardiovascular;  Laterality: N/A;  trish/ja   TOTAL HIP ARTHROPLASTY Left 09/25/2021   Procedure: LEFT TOTAL HIP ARTHROPLASTY ANTERIOR APPROACH;   Surgeon: RFrederik Pear MD;  [facility];  Service: Orthopedics;  Laterality: Left;   TRANSTHORACIC ECHOCARDIOGRAM  74/81/8563  LV systolic function normal with mild conc LVH; LA mildly dilated; trace MR/TR   UPPER GI ENDOSCOPY  2009   negative     Current Outpatient Medications  Medication Sig Dispense Refill   albuterol (VENTOLIN HFA) 108 (90 Base) MCG/ACT inhaler Inhale 1 puff into the lungs every 6 (six) hours as needed for wheezing or shortness of breath.     apixaban (ELIQUIS) 5 MG TABS tablet Take 1 tablet (5 mg total) by mouth 2 (two) times daily. 60 tablet 5   ascorbic acid (VITAMIN C) 1000 MG tablet Take 1,000 mg by mouth daily.     colestipol (COLESTID) 1 g tablet Take 0.5 g by mouth 2 (two) times daily.     diltiazem (CARDIZEM CD) 180 MG 24 hr capsule TAKE ONE CAPSULE ONCE DAILY 90 capsule 3   diphenhydrAMINE (BENADRYL) 50 MG tablet Take 1 tablet by mouth one hour prior to scan (along with last dose of prednisone) 1 tablet 0   famotidine (PEPCID) 20 MG tablet Take 20 mg by mouth 2 (two) times daily.     gabapentin (NEURONTIN) 100 MG capsule  TAKE ONE CAPSULE BY MOUTH THREE TIMES DAILY (Patient taking differently: Take 100 mg by mouth 2 (two) times daily.) 180 capsule 2   guaiFENesin (MUCINEX) 600 MG 12 hr tablet Take 600 mg by mouth 2 (two) times daily.     HYDROcodone-acetaminophen (NORCO/VICODIN) 5-325 MG tablet Take 1 tablet by mouth 2 (two) times daily as needed for moderate pain. 60 tablet 0   latanoprost (XALATAN) 0.005 % ophthalmic solution Place 1 drop into both eyes at bedtime.     lisinopril (ZESTRIL) 20 MG tablet TAKE ONE TABLET BY MOUTH DAILY (Patient taking differently: Take 20 mg by mouth daily.) 90 tablet 1   LORazepam (ATIVAN) 0.5 MG tablet 1 po bid prn (Patient taking differently: Take 0.5 mg by mouth 2 (two) times daily.) 60 tablet 2   metoprolol succinate (TOPROL-XL) 100 MG 24 hr tablet TAKE ONE TABLET BY MOUTH TWICE DAILY 180 tablet 3   metoprolol tartrate  (LOPRESSOR) 25 MG tablet Take 1 tablet (25 mg total) by mouth daily as needed (if HR increases).     Multiple Minerals-Vitamins (CALCIUM CITRATE PLUS/MAGNESIUM PO) Take 400 mg by mouth daily.     nystatin (MYCOSTATIN/NYSTOP) powder Apply 1 application topically once a week. 60 g 6   nystatin ointment (MYCOSTATIN) Apply 1 application topically 2 (two) times daily as needed (IRRITATION). (Patient taking differently: Apply 1 application  topically daily as needed (IRRITATION).) 100 g 6   PARoxetine (PAXIL) 10 MG tablet TAKE 2 AND 1/2 TABLETS BY MOUTH ONCE DAILY (Patient taking differently: Take 25 mg by mouth daily.) 225 tablet 3   potassium chloride (MICRO-K) 10 MEQ CR capsule Take 1 capsule (10 mEq total) by mouth as needed (swelling and/or weight gain). 30 capsule 1   predniSONE (DELTASONE) 50 MG tablet Take 1 tablet by mouth 13 hours prior to scan, 7 hours prior to scan, and one hour prior to scan 3 tablet 0   rOPINIRole (REQUIP XL) 2 MG 24 hr tablet Take 1 tablet (2 mg total) by mouth at bedtime. 90 tablet 3   rosuvastatin (CRESTOR) 10 MG tablet TAKE ONE TABLET AT BEDTIME. (Patient taking differently: Take 10 mg by mouth every evening.) 90 tablet 3   senna (SENOKOT) 8.6 MG TABS Take 1 tablet by mouth at bedtime as needed for mild constipation.     sitaGLIPtin-metformin (JANUMET) 50-1000 MG tablet Take 1 tablet by mouth 2 (two) times daily with a meal. 60 tablet 0   torsemide (DEMADEX) 20 MG tablet Take 1 tablet (20 mg total) by mouth as needed (swelling and/or weight gain). 30 tablet 0   traZODone (DESYREL) 100 MG tablet Up to 200 mg at bedtime for sleep 180 tablet 3   triamcinolone cream (KENALOG) 0.1 % Apply 1 Application topically 2 (two) times daily. (Patient taking differently: Apply 1 Application topically 2 (two) times daily as needed (Rash).) 100 g 0   umeclidinium-vilanterol (ANORO ELLIPTA) 62.5-25 MCG/ACT AEPB Inhale 1 puff into the lungs daily. 60 each 12   Accu-Chek Softclix Lancets  lancets Use as instructed 100 each 12   glucose blood (FREESTYLE LITE) test strip Use as instructed 100 each 12   No current facility-administered medications for this visit.    Allergies:   Benadryl [diphenhydramine hcl], Clopidogrel bisulfate, Ioversol, Benadryl [diphenhydramine], and Iodinated contrast media   Social History:  The patient  reports that she quit smoking about 28 years ago. Her smoking use included cigarettes. She has a 45.00 pack-year smoking history. She has never used smokeless tobacco.  She reports that she does not drink alcohol and does not use drugs.   Family History:  The patient's  family history includes Cancer in her brother and sister; Colon cancer (age of onset: 26) in her brother; Colon polyps in her sister; Emphysema (age of onset: 58) in her mother; Gout in her brother; Heart attack (age of onset: 110) in her father; Hyperlipidemia in her brother; Hypertension in her brother; Pneumonia in her maternal grandmother; Ulcers in her mother.    ROS:  Please see the history of present illness.   All other systems are reviewed and negative.    PHYSICAL EXAM: VS:  BP 138/60   Pulse 80   Ht '5\' 2"'$  (1.575 m)   Wt 193 lb (87.5 kg)   BMI 35.30 kg/m  , BMI Body mass index is 35.3 kg/m. GEN: Well nourished, well developed, in no acute distress  HEENT: normal  Neck: no JVD, carotid bruits, or masses Cardiac: Irregularly irregular; no murmurs, rubs, or gallops,no edema  Respiratory:  clear to auscultation bilaterally, normal work of breathing GI: soft, nontender, nondistended, + BS MS: no deformity or atrophy  Skin: warm and dry  Neuro:  Strength and sensation are intact Psych: euthymic mood, full affect  EKG:  EKG is ordered today. The ekg ordered today shows atrial fibrillation 74 bpm   Recent Labs: 10/18/2021: Pro B Natriuretic peptide (BNP) 108.0 05/18/2022: BNP 102.2; Magnesium 1.8 06/20/2022: ALT 16; BUN 13; Creatinine, Ser 0.92; Hemoglobin 10.8; Platelets  178.0; Potassium 4.8; Sodium 127; TSH 2.04    Lipid Panel     Component Value Date/Time   CHOL 101 06/20/2022 1504   CHOL 110 05/18/2022 1623   TRIG 171.0 (H) 06/20/2022 1504   HDL 41.30 06/20/2022 1504   HDL 44 05/18/2022 1623   CHOLHDL 2 06/20/2022 1504   VLDL 34.2 06/20/2022 1504   LDLCALC 25 06/20/2022 1504   LDLCALC 33 05/18/2022 1623   LDLDIRECT 143.4 01/23/2008 1316     Wt Readings from Last 3 Encounters:  06/27/22 193 lb (87.5 kg)  06/06/22 192 lb (87.1 kg)  05/31/22 189 lb (85.7 kg)      Other studies Reviewed: Additional studies/ records that were reviewed today include:    Echo 05-20-2022: 1. Left ventricular ejection fraction, by estimation, is 60 to 65%. The  left ventricle has normal function. The left ventricle has no regional  wall motion abnormalities. Left ventricular diastolic function could not  be evaluated.   2. Right ventricular systolic function is mildly reduced. The right  ventricular size is normal. Tricuspid regurgitation signal is inadequate  for assessing PA pressure.   3. Left atrial size was mildly dilated.   4. The mitral valve is normal in structure. Trivial mitral valve  regurgitation. No evidence of mitral stenosis.   5. The aortic valve is normal in structure. Aortic valve regurgitation is  mild. No aortic stenosis is present.   6. The inferior vena cava is dilated in size with >50% respiratory  variability, suggesting right atrial pressure of 8 mmHg.    ASSESSMENT AND PLAN:  1.  Longstanding persistent atrial fibrillation I have seen NAYDA RIESEN is a 71 y.o. female in the office today who has been referred for a Watchman left atrial appendage closure device.  She has a history of persistent atrial fibrillation.  The patient's atrial fibrillation-related risk scores are below:   CHA2DS2-VASc Score = 6   This indicates a 9.7% annual risk of stroke. The patient's score  is based upon: CHF History: 1 HTN History: 1 Diabetes  History: 1 Stroke History: 0 Vascular Disease History: 1 Age Score: 1 Gender Score: 1      HAS-BLED score  Hypertension No  Abnormal renal and liver function (Dialysis, transplant, Cr >2.26 mg/dL /Cirrhosis or Bilirubin >2x Normal or AST/ALT/AP >3x Normal) No  Stroke No  Bleeding Yes  Labile INR (Unstable/high INR) No  Elderly (>65) Yes  Drugs or alcohol (? 8 drinks/week, anti-plt or NSAID) No   Unfortunately, She is not felt to be a long term anticoagulation candidate secondary to gastrointestinal bleeding, chronic anemia.  The patients chart has been reviewed and I along with their referring cardiologist feel that they would be a candidate for short term oral anticoagulation.  Procedural risks for the Watchman implant have been reviewed with the patient including a 1% risk of stroke, 1% risk of perforation or pericardial effusion, 0.1% risk of device embolization.  Given the patient's poor candidacy for long-term oral anticoagulation, and ability to tolerate short term oral anticoagulation, it is appropriate to consider Watchman FLX left atrial appendage closure device through a shared decision making conversation with the patient.   Prior to the procedure, I would like to obtain a gated CT scan of the chest with contrast timed for PV/LA visualization.  The patient initially agreed to this approach, then later expressed concern about her contrast allergy.  She was reluctant to consider premedication with prednisone and Benadryl because of Benadryl intolerance.  Alternatively, her left atrial appendage anatomy could be evaluated with transesophageal echo.  We will contact her about this is a potential option.  If she is found to have appropriate anatomy for watchman flex implantation, I think she has a good rationale to pursue this procedure in order to limit the risks of long-term oral anticoagulation.    The patient does not have concerns regarding her medicines.  The following changes were  made today:  none  Labs/ tests ordered today include:  Orders Placed This Encounter  Procedures   CT CARDIAC MORPH/PULM VEIN W/CM&W/O CA SCORE     Signed, Sherren Mocha, MD 06/27/2022  1:59 PM     [place] [address] [place] [id number] [phone] (office) [phone] (fax)

## 2022-06-28 ENCOUNTER — Telehealth: Payer: Self-pay

## 2022-06-28 NOTE — Telephone Encounter (Signed)
Called to discuss reluctance about CT given contrast allergy and to offer TEE prior to [place] instead.  Left message to call back.  Will also discuss contrast given during LAAO procedure to see if she wants to proceed at all.

## 2022-06-28 NOTE — Addendum Note (Signed)
Addended by: Janan Halter F on: 06/28/2022 05:11 PM   Modules accepted: Orders

## 2022-06-28 NOTE — Telephone Encounter (Signed)
Spoke with the patient at length. While there is another option for CT (TEE), [name] does not wish to proceed with testing as contrast is used during the [employer] implant. She states she is tolerating Eliquis 5 mg BID well and will call Dr. Sallyanne Kuster if she has any issues. She was grateful for assistance.  Will send to Dr. Burt Knack and Dr. Sallyanne Kuster as Juluis [place].

## 2022-06-28 NOTE — Telephone Encounter (Signed)
-----   Message from Sherren Mocha, MD sent at 06/27/2022  2:00 PM EST ----- [name] - initially she wanted to move forward with CTA, now is reluctant because of contrast allergy and doesn't want to take premeds. Valetta Fuller will call her to give the option of TEE instead. I agree there is good rationale for [facility]. Thx for referring her. Ronalee Belts

## 2022-07-01 ENCOUNTER — Other Ambulatory Visit: Payer: Self-pay | Admitting: Internal Medicine

## 2022-07-02 ENCOUNTER — Ambulatory Visit: Payer: HMO | Admitting: Internal Medicine

## 2022-07-02 NOTE — Telephone Encounter (Signed)
Pt got labs done 06/20/22 and seen results via my chart

## 2022-07-09 ENCOUNTER — Other Ambulatory Visit: Payer: Self-pay

## 2022-07-09 MED ORDER — NYSTATIN 100000 UNIT/GM EX OINT: 1.0000 | TOPICAL_OINTMENT | Freq: Two times a day (BID) | CUTANEOUS | 6 refills | Status: DC | PRN

## 2022-07-12 ENCOUNTER — Telehealth (HOSPITAL_COMMUNITY): Payer: Self-pay

## 2022-07-12 NOTE — Telephone Encounter (Signed)
No response from pt.  Closed referral  

## 2022-07-18 ENCOUNTER — Encounter: Payer: Self-pay | Admitting: Podiatry

## 2022-07-18 ENCOUNTER — Ambulatory Visit (INDEPENDENT_AMBULATORY_CARE_PROVIDER_SITE_OTHER): Payer: HMO | Admitting: Podiatry

## 2022-07-18 DIAGNOSIS — L600 Ingrowing nail: Secondary | ICD-10-CM

## 2022-07-18 NOTE — Patient Instructions (Signed)

## 2022-07-19 NOTE — Progress Notes (Signed)
Subjective:   Patient ID: [name], female   DOB: 71 y.o.   MRN: [id number]   HPI Patient states the right second toenail has become very thickened incurvated and painful and she has had other nails removed in the past and needs this 1 removed.  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  I then did sterile prep of the toe I removed the second nail sterile instrumentation applied phenol for applications 30 seconds followed by alcohol lavage sterile dressing gave instructions on soaks leave dressing on 24 hours take it off earlier if throbbing were to occur and encouraged her to call with questions concerns which may arise

## 2022-07-27 ENCOUNTER — Telehealth: Payer: Self-pay | Admitting: Podiatry

## 2022-07-27 ENCOUNTER — Inpatient Hospital Stay (HOSPITAL_COMMUNITY)
Admission: EM | Admit: 2022-07-27 | Discharge: 2022-07-29 | DRG: 871 | Disposition: A | Discharge status: Home / Self Care | Payer: HMO | Attending: Internal Medicine | Admitting: Internal Medicine

## 2022-07-27 ENCOUNTER — Telehealth: Payer: Self-pay | Admitting: Cardiovascular Disease

## 2022-07-27 ENCOUNTER — Emergency Department (HOSPITAL_COMMUNITY): Payer: HMO

## 2022-07-27 DIAGNOSIS — A419 Sepsis, unspecified organism: Secondary | ICD-10-CM | POA: Diagnosis present

## 2022-07-27 DIAGNOSIS — R6520 Severe sepsis without septic shock: Secondary | ICD-10-CM | POA: Diagnosis present

## 2022-07-27 DIAGNOSIS — I4821 Permanent atrial fibrillation: Secondary | ICD-10-CM | POA: Diagnosis present

## 2022-07-27 DIAGNOSIS — G8929 Other chronic pain: Secondary | ICD-10-CM | POA: Diagnosis present

## 2022-07-27 DIAGNOSIS — Z7951 Long term (current) use of inhaled steroids: Secondary | ICD-10-CM

## 2022-07-27 DIAGNOSIS — I5032 Chronic diastolic (congestive) heart failure: Secondary | ICD-10-CM | POA: Diagnosis present

## 2022-07-27 DIAGNOSIS — Z1152 Encounter for screening for COVID-19: Secondary | ICD-10-CM | POA: Diagnosis not present

## 2022-07-27 DIAGNOSIS — M4800 Spinal stenosis, site unspecified: Secondary | ICD-10-CM | POA: Diagnosis present

## 2022-07-27 DIAGNOSIS — K529 Noninfective gastroenteritis and colitis, unspecified: Secondary | ICD-10-CM | POA: Diagnosis present

## 2022-07-27 DIAGNOSIS — Z9861 Coronary angioplasty status: Secondary | ICD-10-CM

## 2022-07-27 DIAGNOSIS — E282 Polycystic ovarian syndrome: Secondary | ICD-10-CM | POA: Diagnosis present

## 2022-07-27 DIAGNOSIS — Z7984 Long term (current) use of oral hypoglycemic drugs: Secondary | ICD-10-CM

## 2022-07-27 DIAGNOSIS — E871 Hypo-osmolality and hyponatremia: Secondary | ICD-10-CM | POA: Diagnosis not present

## 2022-07-27 DIAGNOSIS — I110 Hypertensive heart disease with heart failure: Secondary | ICD-10-CM | POA: Diagnosis present

## 2022-07-27 DIAGNOSIS — B961 Klebsiella pneumoniae [K. pneumoniae] as the cause of diseases classified elsewhere: Secondary | ICD-10-CM | POA: Diagnosis present

## 2022-07-27 DIAGNOSIS — E1169 Type 2 diabetes mellitus with other specified complication: Secondary | ICD-10-CM | POA: Diagnosis present

## 2022-07-27 DIAGNOSIS — M545 Low back pain, unspecified: Secondary | ICD-10-CM | POA: Diagnosis present

## 2022-07-27 DIAGNOSIS — Z800 Family history of malignant neoplasm of digestive organs: Secondary | ICD-10-CM

## 2022-07-27 DIAGNOSIS — K219 Gastro-esophageal reflux disease without esophagitis: Secondary | ICD-10-CM | POA: Diagnosis present

## 2022-07-27 DIAGNOSIS — N390 Urinary tract infection, site not specified: Secondary | ICD-10-CM | POA: Diagnosis present

## 2022-07-27 DIAGNOSIS — E222 Syndrome of inappropriate secretion of antidiuretic hormone: Secondary | ICD-10-CM | POA: Diagnosis present

## 2022-07-27 DIAGNOSIS — J189 Pneumonia, unspecified organism: Secondary | ICD-10-CM | POA: Diagnosis present

## 2022-07-27 DIAGNOSIS — E119 Type 2 diabetes mellitus without complications: Secondary | ICD-10-CM

## 2022-07-27 DIAGNOSIS — Z8701 Personal history of pneumonia (recurrent): Secondary | ICD-10-CM

## 2022-07-27 DIAGNOSIS — Z8249 Family history of ischemic heart disease and other diseases of the circulatory system: Secondary | ICD-10-CM

## 2022-07-27 DIAGNOSIS — Z87891 Personal history of nicotine dependence: Secondary | ICD-10-CM

## 2022-07-27 DIAGNOSIS — J440 Chronic obstructive pulmonary disease with acute lower respiratory infection: Secondary | ICD-10-CM | POA: Diagnosis present

## 2022-07-27 DIAGNOSIS — E872 Acidosis, unspecified: Secondary | ICD-10-CM | POA: Diagnosis present

## 2022-07-27 DIAGNOSIS — I10 Essential (primary) hypertension: Secondary | ICD-10-CM | POA: Diagnosis present

## 2022-07-27 DIAGNOSIS — Z808 Family history of malignant neoplasm of other organs or systems: Secondary | ICD-10-CM

## 2022-07-27 DIAGNOSIS — Z83719 Family history of colon polyps, unspecified: Secondary | ICD-10-CM

## 2022-07-27 DIAGNOSIS — Z955 Presence of coronary angioplasty implant and graft: Secondary | ICD-10-CM

## 2022-07-27 DIAGNOSIS — N3000 Acute cystitis without hematuria: Secondary | ICD-10-CM | POA: Diagnosis not present

## 2022-07-27 DIAGNOSIS — K760 Fatty (change of) liver, not elsewhere classified: Secondary | ICD-10-CM | POA: Diagnosis present

## 2022-07-27 DIAGNOSIS — Z83438 Family history of other disorder of lipoprotein metabolism and other lipidemia: Secondary | ICD-10-CM

## 2022-07-27 DIAGNOSIS — E669 Obesity, unspecified: Secondary | ICD-10-CM | POA: Diagnosis present

## 2022-07-27 DIAGNOSIS — Z86718 Personal history of other venous thrombosis and embolism: Secondary | ICD-10-CM

## 2022-07-27 DIAGNOSIS — E875 Hyperkalemia: Secondary | ICD-10-CM | POA: Diagnosis present

## 2022-07-27 DIAGNOSIS — Z6834 Body mass index (BMI) 34.0-34.9, adult: Secondary | ICD-10-CM

## 2022-07-27 DIAGNOSIS — Z888 Allergy status to other drugs, medicaments and biological substances status: Secondary | ICD-10-CM

## 2022-07-27 DIAGNOSIS — Z91041 Radiographic dye allergy status: Secondary | ICD-10-CM

## 2022-07-27 DIAGNOSIS — I509 Heart failure, unspecified: Secondary | ICD-10-CM

## 2022-07-27 DIAGNOSIS — I2510 Atherosclerotic heart disease of native coronary artery without angina pectoris: Secondary | ICD-10-CM

## 2022-07-27 DIAGNOSIS — E66811 Obesity, class 1: Secondary | ICD-10-CM | POA: Diagnosis present

## 2022-07-27 DIAGNOSIS — E785 Hyperlipidemia, unspecified: Secondary | ICD-10-CM | POA: Diagnosis present

## 2022-07-27 DIAGNOSIS — Z7901 Long term (current) use of anticoagulants: Secondary | ICD-10-CM

## 2022-07-27 DIAGNOSIS — H409 Unspecified glaucoma: Secondary | ICD-10-CM | POA: Diagnosis present

## 2022-07-27 DIAGNOSIS — Z90710 Acquired absence of both cervix and uterus: Secondary | ICD-10-CM

## 2022-07-27 DIAGNOSIS — Z825 Family history of asthma and other chronic lower respiratory diseases: Secondary | ICD-10-CM

## 2022-07-27 DIAGNOSIS — Z79899 Other long term (current) drug therapy: Secondary | ICD-10-CM

## 2022-07-27 LAB — LACTIC ACID, PLASMA
Lactic Acid, Venous: 2.6 mmol/L (ref 0.5–1.9)
Lactic Acid, Venous: 2.7 mmol/L (ref 0.5–1.9)

## 2022-07-27 LAB — I-STAT CHEM 8, ED
BUN: 12 mg/dL (ref 8–23)
BUN: 12 mg/dL (ref 8–23)
Calcium, Ion: 1.13 mmol/L — ABNORMAL LOW (ref 1.15–1.40)
Calcium, Ion: 1.16 mmol/L (ref 1.15–1.40)
Chloride: 101 mmol/L (ref 98–111)
Chloride: 99 mmol/L (ref 98–111)
Creatinine, Ser: 0.8 mg/dL (ref 0.44–1.00)
Creatinine, Ser: 0.9 mg/dL (ref 0.44–1.00)
Glucose, Bld: 265 mg/dL — ABNORMAL HIGH (ref 70–99)
Glucose, Bld: 259 mg/dL — ABNORMAL HIGH (ref 70–99)
HCT: 36 % (ref 36.0–46.0)
HCT: 34 % — ABNORMAL LOW (ref 36.0–46.0)
Hemoglobin: 12.2 g/dL (ref 12.0–15.0)
Hemoglobin: 11.6 g/dL — ABNORMAL LOW (ref 12.0–15.0)
Potassium: 6 mmol/L — ABNORMAL HIGH (ref 3.5–5.1)
Potassium: 6.1 mmol/L — ABNORMAL HIGH (ref 3.5–5.1)
Sodium: 129 mmol/L — ABNORMAL LOW (ref 135–145)
Sodium: 129 mmol/L — ABNORMAL LOW (ref 135–145)
TCO2: 20 mmol/L — ABNORMAL LOW (ref 22–32)
TCO2: 19 mmol/L — ABNORMAL LOW (ref 22–32)

## 2022-07-27 LAB — RESP PANEL BY RT-PCR (RSV, FLU A&B, COVID)  RVPGX2
Influenza A by PCR: NEGATIVE
Influenza B by PCR: NEGATIVE
Resp Syncytial Virus by PCR: NEGATIVE
SARS Coronavirus 2 by RT PCR: NEGATIVE

## 2022-07-27 LAB — CBC WITH DIFFERENTIAL/PLATELET
Abs Immature Granulocytes: 0.11 10*3/uL — ABNORMAL HIGH (ref 0.00–0.07)
Basophils Absolute: 0.1 10*3/uL (ref 0.0–0.1)
Basophils Relative: 0 %
Eosinophils Absolute: 0 10*3/uL (ref 0.0–0.5)
Eosinophils Relative: 0 %
HCT: 33.9 % — ABNORMAL LOW (ref 36.0–46.0)
Hemoglobin: 12 g/dL (ref 12.0–15.0)
Immature Granulocytes: 1 %
Lymphocytes Relative: 2 %
Lymphs Abs: 0.3 10*3/uL — ABNORMAL LOW (ref 0.7–4.0)
MCH: 29.8 pg (ref 26.0–34.0)
MCHC: 35.4 g/dL (ref 30.0–36.0)
MCV: 84.1 fL (ref 80.0–100.0)
Monocytes Absolute: 1.1 10*3/uL — ABNORMAL HIGH (ref 0.1–1.0)
Monocytes Relative: 5 %
Neutro Abs: 18.1 10*3/uL — ABNORMAL HIGH (ref 1.7–7.7)
Neutrophils Relative %: 92 %
Platelets: 176 10*3/uL (ref 150–400)
RBC: 4.03 MIL/uL (ref 3.87–5.11)
RDW: 17.6 % — ABNORMAL HIGH (ref 11.5–15.5)
WBC: 19.6 10*3/uL — ABNORMAL HIGH (ref 4.0–10.5)
nRBC: 0 % (ref 0.0–0.2)

## 2022-07-27 LAB — URINALYSIS, ROUTINE W REFLEX MICROSCOPIC
Bilirubin Urine: NEGATIVE
Glucose, UA: NEGATIVE mg/dL
Ketones, ur: NEGATIVE mg/dL
Nitrite: POSITIVE — AB
Protein, ur: >=300 mg/dL — AB
Specific Gravity, Urine: 1.013 (ref 1.005–1.030)
WBC, UA: >50 WBC/hpf — ABNORMAL HIGH (ref 0–5)
pH: 5 (ref 5.0–8.0)

## 2022-07-27 LAB — PROTIME-INR
INR: 1.2 (ref 0.8–1.2)
Prothrombin Time: 15.4 seconds — ABNORMAL HIGH (ref 11.4–15.2)

## 2022-07-27 LAB — COMPREHENSIVE METABOLIC PANEL
ALT: 16 U/L (ref 0–44)
AST: 26 U/L (ref 15–41)
Albumin: 3.9 g/dL (ref 3.5–5.0)
Alkaline Phosphatase: 50 U/L (ref 38–126)
Anion gap: 10 (ref 5–15)
BUN: 10 mg/dL (ref 8–23)
CO2: 18 mmol/L — ABNORMAL LOW (ref 22–32)
Calcium: 8.9 mg/dL (ref 8.9–10.3)
Chloride: 99 mmol/L (ref 98–111)
Creatinine, Ser: 0.96 mg/dL (ref 0.44–1.00)
GFR, Estimated: >60 mL/min (ref >60)
Glucose, Bld: 263 mg/dL — ABNORMAL HIGH (ref 70–99)
Potassium: 6 mmol/L — ABNORMAL HIGH (ref 3.5–5.1)
Sodium: 127 mmol/L — ABNORMAL LOW (ref 135–145)
Total Bilirubin: 0.5 mg/dL (ref 0.3–1.2)
Total Protein: 7.2 g/dL (ref 6.5–8.1)

## 2022-07-27 LAB — APTT: aPTT: 29 seconds (ref 24–36)

## 2022-07-27 MED ORDER — MORPHINE SULFATE (PF) 4 MG/ML IV SOLN
4.0000 mg | Freq: Once | INTRAVENOUS | Status: AC
  Administered 2022-07-27: 4 mg via INTRAVENOUS
  Filled 2022-07-27: qty 1

## 2022-07-27 MED ORDER — APIXABAN 5 MG PO TABS
5.0000 mg | ORAL_TABLET | Freq: Two times a day (BID) | ORAL | Status: DC
  Administered 2022-07-28 – 2022-07-29 (×4): 5 mg via ORAL
  Filled 2022-07-27 (×4): qty 1

## 2022-07-27 MED ORDER — SODIUM CHLORIDE 0.9 % IV BOLUS
500.0000 mL | Freq: Once | INTRAVENOUS | Status: AC
  Administered 2022-07-27: 500 mL via INTRAVENOUS

## 2022-07-27 MED ORDER — ALBUTEROL SULFATE (2.5 MG/3ML) 0.083% IN NEBU
10.0000 mg | INHALATION_SOLUTION | Freq: Once | RESPIRATORY_TRACT | Status: DC
  Filled 2022-07-27: qty 12

## 2022-07-27 MED ORDER — CALCIUM GLUCONATE 10 % IV SOLN
1.0000 g | Freq: Once | INTRAVENOUS | Status: AC
  Administered 2022-07-27: 1 g via INTRAVENOUS
  Filled 2022-07-27: qty 10

## 2022-07-27 MED ORDER — INSULIN ASPART 100 UNIT/ML IV SOLN
10.0000 [IU] | Freq: Once | INTRAVENOUS | Status: AC
  Administered 2022-07-27: 10 [IU] via INTRAVENOUS

## 2022-07-27 MED ORDER — SODIUM CHLORIDE 0.9 % IV SOLN
500.0000 mg | INTRAVENOUS | Status: DC
  Administered 2022-07-27 – 2022-07-28 (×2): 500 mg via INTRAVENOUS
  Filled 2022-07-27 (×2): qty 5

## 2022-07-27 MED ORDER — FUROSEMIDE 10 MG/ML IJ SOLN
40.0000 mg | Freq: Once | INTRAMUSCULAR | Status: AC
  Administered 2022-07-27: 40 mg via INTRAVENOUS
  Filled 2022-07-27: qty 4

## 2022-07-27 MED ORDER — LACTATED RINGERS IV BOLUS (SEPSIS)
1000.0000 mL | Freq: Once | INTRAVENOUS | Status: AC
  Administered 2022-07-27: 1000 mL via INTRAVENOUS

## 2022-07-27 MED ORDER — INSULIN ASPART 100 UNIT/ML IJ SOLN
0.0000 [IU] | Freq: Three times a day (TID) | INTRAMUSCULAR | Status: DC
  Administered 2022-07-28: 3 [IU] via SUBCUTANEOUS
  Administered 2022-07-28: 2 [IU] via SUBCUTANEOUS
  Administered 2022-07-29: 3 [IU] via SUBCUTANEOUS
  Administered 2022-07-29: 2 [IU] via SUBCUTANEOUS

## 2022-07-27 MED ORDER — ROPINIROLE HCL 1 MG PO TABS
1.0000 mg | ORAL_TABLET | Freq: Two times a day (BID) | ORAL | Status: DC
  Administered 2022-07-28 – 2022-07-29 (×4): 1 mg via ORAL
  Filled 2022-07-27 (×4): qty 1

## 2022-07-27 MED ORDER — LACTATED RINGERS IV SOLN: INTRAVENOUS | Status: AC

## 2022-07-27 MED ORDER — ACETAMINOPHEN 500 MG PO TABS
1000.0000 mg | ORAL_TABLET | Freq: Once | ORAL | Status: AC
  Administered 2022-07-27: 1000 mg via ORAL
  Filled 2022-07-27: qty 2

## 2022-07-27 MED ORDER — SODIUM BICARBONATE 8.4 % IV SOLN
Freq: Once | INTRAVENOUS | Status: DC
  Filled 2022-07-27: qty 1000

## 2022-07-27 MED ORDER — LACTATED RINGERS IV SOLN: INTRAVENOUS | Status: DC

## 2022-07-27 MED ORDER — SODIUM ZIRCONIUM CYCLOSILICATE 10 G PO PACK
10.0000 g | PACK | Freq: Once | ORAL | Status: AC
  Administered 2022-07-27: 10 g via ORAL
  Filled 2022-07-27: qty 1

## 2022-07-27 MED ORDER — SODIUM CHLORIDE 0.9 % IV SOLN
2.0000 g | INTRAVENOUS | Status: DC
  Administered 2022-07-27 – 2022-07-28 (×2): 2 g via INTRAVENOUS
  Filled 2022-07-27 (×2): qty 20

## 2022-07-27 NOTE — Assessment & Plan Note (Signed)
-  rate controlled -continue Eliquis and beta-blocker

## 2022-07-27 NOTE — Telephone Encounter (Signed)
Spoke with patient's wife who stated patient has been SOB all day, but she just found out 2 hours ago. She stated patient is slow to formulate answers to questions; "unresponsive". Her chest is tight. Recommended she call 911. She asked why, instead of her taking her in car. I explained that EMS can perform a neurological exam, connect her to a cardiac monitor, check her glucose, and get her seen quicker at the ED. She verbalized understanding and said they will go to [facility].

## 2022-07-27 NOTE — Telephone Encounter (Signed)
Patient called her toe is still swollen and red, does it usually take this long for her toe to heal? Should she call back next week and give you and update.  She is going to send a pic to Dr Paulla Dolly, she is still using Neosporin with pain control , she is still doing the soaks, but it is no longer bleeding.  She is feeling shooting pains coming from that toe.

## 2022-07-27 NOTE — Assessment & Plan Note (Signed)
-  Controlled with recent A1C of 6.8 -place on sensitive SSI

## 2022-07-27 NOTE — Assessment & Plan Note (Signed)
continue statin

## 2022-07-27 NOTE — Assessment & Plan Note (Signed)
-  appears euvolemic on exam but suspect she is still hypovolemic with her hyponatremia and sepsis -continue home beta-blocker

## 2022-07-27 NOTE — ED Provider Notes (Signed)
[facility] EMERGENCY DEPARTMENT Provider Note   CSN: [id number] Arrival date & time: 07/27/22  1748     History  Chief Complaint  Patient presents with   Shortness of Breath    [name] is a 71 y.o. female.  The history is provided by the patient, medical records and the EMS personnel. No language interpreter was used.  Shortness of Breath    71 year old female with significant history of asthma, diabetes, CAD, CHF, COPD, atrial fibrillation on Eliquis brought here via EMS from home with complaints of shortness of breath.  Patient for the last 4 days the she has had fever, chills, body aches, congestion, nonproductive cough and increased shortness of breath.  She also endorsed generalized fatigue.  States multiple family members at home has similar symptoms.  She does have history of COPD and endorse occasional wheeze but not much.  She does not endorse any nausea or vomiting but does endorse some loose stools.  No abdominal pain no back pain no dysuria.  No treatment tried at home.  She does not wear supplemental oxygen.  Home Medications Prior to Admission medications   Medication Sig Start Date End Date Taking? Authorizing Provider  Accu-Chek Softclix Lancets lancets Use as instructed 11/10/21   Hoyt Koch, MD  albuterol (VENTOLIN HFA) 108 (90 Base) MCG/ACT inhaler Inhale 1 puff into the lungs every 6 (six) hours as needed for wheezing or shortness of breath.    [provider]  apixaban (ELIQUIS) 5 MG TABS tablet Take 1 tablet (5 mg total) by mouth 2 (two) times daily. 05/22/22   Lily Kocher, PA-C  ascorbic acid (VITAMIN C) 1000 MG tablet Take 1,000 mg by mouth daily.    [provider]  colestipol (COLESTID) 1 g tablet Take 0.5 g by mouth 2 (two) times daily. 02/10/20   [provider]  diltiazem (CARDIZEM CD) 180 MG 24 hr capsule TAKE ONE CAPSULE ONCE DAILY 04/02/22   Lorretta Harp, MD  diphenhydrAMINE  (BENADRYL) 50 MG tablet Take 1 tablet by mouth one hour prior to scan (along with last dose of prednisone) 06/27/22   Sherren Mocha, MD  famotidine (PEPCID) 20 MG tablet Take 20 mg by mouth 2 (two) times daily. 02/10/20   [provider]  gabapentin (NEURONTIN) 100 MG capsule TAKE ONE CAPSULE BY MOUTH THREE TIMES DAILY Patient taking differently: Take 100 mg by mouth 2 (two) times daily. 11/28/21   Hoyt Koch, MD  glucose blood (FREESTYLE LITE) test strip Use as instructed 11/10/21   Hoyt Koch, MD  guaiFENesin (MUCINEX) 600 MG 12 hr tablet Take 600 mg by mouth 2 (two) times daily.    [provider]  HYDROcodone-acetaminophen (NORCO/VICODIN) 5-325 MG tablet Take 1 tablet by mouth 2 (two) times daily as needed for moderate pain. 04/03/22   Hoyt Koch, MD  latanoprost (XALATAN) 0.005 % ophthalmic solution Place 1 drop into both eyes at bedtime. 02/07/21   [provider]  lisinopril (ZESTRIL) 20 MG tablet TAKE ONE TABLET BY MOUTH DAILY Patient taking differently: Take 20 mg by mouth daily. 01/30/22   Hoyt Koch, MD  LORazepam (ATIVAN) 0.5 MG tablet 1 po bid prn Patient taking differently: Take 0.5 mg by mouth 2 (two) times daily. 03/20/22   Plotnikov, Evie Lacks, MD  metoprolol succinate (TOPROL-XL) 100 MG 24 hr tablet TAKE ONE TABLET BY MOUTH TWICE DAILY 05/18/22   Croitoru, Mihai, MD  metoprolol tartrate (LOPRESSOR) 25 MG tablet  Take 1 tablet (25 mg total) by mouth daily as needed (if HR increases). 07/27/21   Croitoru, Mihai, MD  Multiple Minerals-Vitamins (CALCIUM CITRATE PLUS/MAGNESIUM PO) Take 400 mg by mouth daily.    [provider]  nystatin (MYCOSTATIN/NYSTOP) powder Apply 1 application topically once a week. 06/20/21   Hoyt Koch, MD  nystatin ointment (MYCOSTATIN) Apply 1 Application topically 2 (two) times daily as needed (IRRITATION). 07/09/22   Hoyt Koch, MD  PARoxetine (PAXIL) 10 MG tablet  TAKE 2 AND 1/2 TABLETS BY MOUTH ONCE DAILY Patient taking differently: Take 25 mg by mouth daily. 02/12/22   Hoyt Koch, MD  potassium chloride (MICRO-K) 10 MEQ CR capsule Take 1 capsule (10 mEq total) by mouth as needed (swelling and/or weight gain). 05/31/22   Almyra Deforest, PA  predniSONE (DELTASONE) 50 MG tablet Take 1 tablet by mouth 13 hours prior to scan, 7 hours prior to scan, and one hour prior to scan 06/27/22   Sherren Mocha, MD  rOPINIRole (REQUIP XL) 2 MG 24 hr tablet Take 1 tablet (2 mg total) by mouth at bedtime. 05/29/22   Lomax, Amy, NP  rosuvastatin (CRESTOR) 10 MG tablet TAKE ONE TABLET AT BEDTIME. Patient taking differently: Take 10 mg by mouth every evening. 03/15/22   Croitoru, Mihai, MD  senna (SENOKOT) 8.6 MG TABS Take 1 tablet by mouth at bedtime as needed for mild constipation.    [provider]  sitaGLIPtin-metformin (JANUMET) 50-1000 MG tablet Take 1 tablet by mouth 2 (two) times daily with a meal. 03/09/22   Hoyt Koch, MD  torsemide (DEMADEX) 20 MG tablet Take 1 tablet (20 mg total) by mouth as needed (swelling and/or weight gain). 05/31/22   Almyra Deforest, PA  traZODone (DESYREL) 100 MG tablet Up to 200 mg at bedtime for sleep 05/29/22   Lomax, Amy, NP  triamcinolone cream (KENALOG) 0.1 % Apply 1 Application topically 2 (two) times daily. Patient taking differently: Apply 1 Application topically 2 (two) times daily as needed (Rash). 04/03/22   Hoyt Koch, MD  umeclidinium-vilanterol [facility] ELLIPTA) 62.5-25 MCG/ACT AEPB Inhale 1 puff into the lungs daily. 12/25/21   Deneise Lever, MD      Allergies    Benadryl [diphenhydramine hcl], Clopidogrel bisulfate, Ioversol, Benadryl [diphenhydramine], and Iodinated contrast media    Review of Systems   Review of Systems  Respiratory:  Positive for shortness of breath.   All other systems reviewed and are negative.   Physical Exam Updated Vital Signs BP 122/66   Pulse (!) 110   Temp  98.9 F (37.2 C) (Oral)   Resp (!) 22   Ht '5\' 2"'$  (1.575 m)   Wt 84.8 kg   SpO2 95%   BMI 34.20 kg/m  Physical Exam Vitals and nursing note reviewed.  Constitutional:      General: She is not in acute distress.    Appearance: She is well-developed.     Comments: On patient is ill-appearing, speaking slowly but is protecting her airway.  HENT:     Head: Atraumatic.  Eyes:     Conjunctiva/sclera: Conjunctivae normal.  Neck:     Comments: No nuchal rigidity Cardiovascular:     Rate and Rhythm: Tachycardia present.  Pulmonary:     Effort: Tachypnea present.     Breath sounds: Examination of the left-lower field reveals rales. Decreased breath sounds and rales present. No wheezing or rhonchi.  Chest:     Chest wall: No tenderness.  Abdominal:  Palpations: Abdomen is soft.  Musculoskeletal:     Cervical back: Normal range of motion and neck supple.  Skin:    General: Skin is warm.     Findings: No rash.  Neurological:     Mental Status: She is alert and oriented to person, place, and time.  Psychiatric:        Mood and Affect: Mood normal.     ED Results / Procedures / Treatments   Labs (all labs ordered are listed, but only abnormal results are displayed) Labs Reviewed  LACTIC ACID, PLASMA - Abnormal; Notable for the following components:      Result Value   Lactic Acid, Venous 2.6 (*)    All other components within normal limits  LACTIC ACID, PLASMA - Abnormal; Notable for the following components:   Lactic Acid, Venous 2.7 (*)    All other components within normal limits  COMPREHENSIVE METABOLIC PANEL - Abnormal; Notable for the following components:   Sodium 127 (*)    Potassium 6.0 (*)    CO2 18 (*)    Glucose, Bld 263 (*)    All other components within normal limits  CBC WITH DIFFERENTIAL/PLATELET - Abnormal; Notable for the following components:   WBC 19.6 (*)    HCT 33.9 (*)    RDW 17.6 (*)    Neutro Abs 18.1 (*)    Lymphs Abs 0.3 (*)    Monocytes  Absolute 1.1 (*)    Abs Immature Granulocytes 0.11 (*)    All other components within normal limits  PROTIME-INR - Abnormal; Notable for the following components:   Prothrombin Time 15.4 (*)    All other components within normal limits  URINALYSIS, ROUTINE W REFLEX MICROSCOPIC - Abnormal; Notable for the following components:   APPearance CLOUDY (*)    Hgb urine dipstick SMALL (*)    Protein, ur >=300 (*)    Nitrite POSITIVE (*)    Leukocytes,Ua LARGE (*)    WBC, UA >50 (*)    Bacteria, UA MANY (*)    All other components within normal limits  I-STAT CHEM 8, ED - Abnormal; Notable for the following components:   Sodium 129 (*)    Potassium 6.0 (*)    Glucose, Bld 265 (*)    Calcium, Ion 1.13 (*)    TCO2 20 (*)    All other components within normal limits  I-STAT CHEM 8, ED - Abnormal; Notable for the following components:   Sodium 129 (*)    Potassium 6.1 (*)    Glucose, Bld 259 (*)    TCO2 19 (*)    Hemoglobin 11.6 (*)    HCT 34.0 (*)    All other components within normal limits  RESP PANEL BY RT-PCR (RSV, FLU A&B, COVID)  RVPGX2  CULTURE, BLOOD (ROUTINE X 2)  URINE CULTURE  CULTURE, BLOOD (ROUTINE X 2) W REFLEX TO ID PANEL  APTT    EKG EKG Interpretation  Date/Time:  Friday July 27 2022 18:00:09 EST Ventricular Rate:  112 PR Interval:    QRS Duration: 91 QT Interval:  331 QTC Calculation: 452 R Axis:   -68 Text Interpretation: Atrial fibrillation Left anterior fascicular block Anterior infarct, old Confirmed by Pattricia Boss [phone]) on 07/27/2022 7:11:05 PM  Radiology DG Chest Port 1 View  Result Date: 07/27/2022 CLINICAL DATA:  Possible sepsis, shortness of breath EXAM: PORTABLE CHEST 1 VIEW COMPARISON:  Previous studies including the examination of 10/18/2021 FINDINGS: Transverse diameter of heart is increased. There are  no signs of pulmonary edema. Increased markings are seen in the medial left lower lung field. Small transverse linear densities in the  lateral aspect of left lower lung fields may suggest scarring or subsegmental atelectasis. There is no pleural effusion or pneumothorax. IMPRESSION: There new patchy infiltrate in the medial left lower lung fields suggesting possible pneumonia in left lower lobe. Cardiomegaly. Electronically Signed   By: Elmer Picker M.D.   On: 07/27/2022 18:46    Procedures .Critical Care  Performed by: Domenic Moras, PA-C Authorized by: Domenic Moras, PA-C   Critical care provider statement:    Critical care time (minutes):  75   Critical care was time spent personally by me on the following activities:  Development of treatment plan with patient or surrogate, discussions with consultants, evaluation of patient's response to treatment, examination of patient, ordering and review of laboratory studies, ordering and review of radiographic studies, ordering and performing treatments and interventions, pulse oximetry, re-evaluation of patient's condition and review of old charts     Medications Ordered in ED Medications  lactated ringers infusion ( Intravenous New Bag/Given 07/27/22 1918)  cefTRIAXone (ROCEPHIN) 2 g in sodium chloride 0.9 % 100 mL IVPB (0 g Intravenous Stopped 07/27/22 2045)  azithromycin (ZITHROMAX) 500 mg in sodium chloride 0.9 % 250 mL IVPB (0 mg Intravenous Stopped 07/27/22 2045)  albuterol (PROVENTIL) (2.5 MG/3ML) 0.083% nebulizer solution 10 mg (10 mg Nebulization Patient Refused/Not Given 07/27/22 2045)  sodium bicarbonate 150 mEq in dextrose 5 % 1,150 mL infusion ( Intravenous New Bag/Given 07/27/22 2124)  lactated ringers bolus 1,000 mL (0 mLs Intravenous Stopped 07/27/22 2045)  acetaminophen (TYLENOL) tablet 1,000 mg (1,000 mg Oral Given 07/27/22 1829)  furosemide (LASIX) injection 40 mg (40 mg Intravenous Given 07/27/22 2120)  sodium chloride 0.9 % bolus 500 mL (0 mLs Intravenous Stopped 07/27/22 2132)  sodium zirconium cyclosilicate (LOKELMA) packet 10 g (10 g Oral Given 07/27/22  2121)  calcium gluconate inj 10% (1 g) URGENT USE ONLY! (1 g Intravenous Given 07/27/22 2121)  insulin aspart (novoLOG) injection 10 Units (10 Units Intravenous Given 07/27/22 2139)  morphine (PF) 4 MG/ML injection 4 mg (4 mg Intravenous Given 07/27/22 2125)    ED Course/ Medical Decision Making/ A&P                           Medical Decision Making Amount and/or Complexity of Data Reviewed Labs: ordered. Radiology: ordered. ECG/medicine tests: ordered.  Risk OTC drugs. Prescription drug management.   BP 122/66   Pulse (!) 110   Temp (!) 100.8 F (38.2 C) (Oral)   Resp (!) 22   Ht '5\' 2"'$  (1.575 m)   Wt 84.8 kg   SpO2 95%   BMI 34.20 kg/m   46:71 PM  71 year old female with significant history of asthma, diabetes, CAD, CHF, COPD, atrial fibrillation on Eliquis brought here via EMS from home with complaints of shortness of breath.  Patient for the last 4 days the she has had fever, chills, body aches, congestion, nonproductive cough and increased shortness of breath.  She also endorsed generalized fatigue.  States multiple family members at home has similar symptoms.  She does have history of COPD and endorse occasional wheeze but not much.  She does not endorse any nausea or vomiting but does endorse some loose stools.  No abdominal pain no back pain no dysuria.  No treatment tried at home.  She does not wear supplemental oxygen.  Patient  is a 71 y/o female who presented to the ED today with sob/cough detailed above.      Handoff received from EMS.  Additional history discussed with patient's family/caregivers.  External chart has been reviewed including notes from cardiology and podiatry from today. Patient's presentation is complicated by their history of CAD, HTN, COPD, afib.  Patient placed on continuous vitals and telemetry monitoring while in ED which was reviewed periodically.    Complete initial physical exam performed, notably the patient  was in mild respiratory  discomfort.      Reviewed and confirmed nursing documentation for past medical history, family history, social history.     Initial Assessment:   With the patient's presentation of sob/cough, most likely diagnosis is sepsis from pulmonary source. Other diagnoses were considered including (but not limited to) UTI, cellulitis, PE, meningitis. These are considered less likely due to history of present illness and physical exam findings.   This is most consistent with an acute life/limb threatening illness complicated by underlying chronic conditions.   Initial Plan:  Sepsis protocol  Screening labs including CBC and Metabolic panel to evaluate for infectious or metabolic etiology of disease.  Urinalysis with reflex culture ordered to evaluate for UTI or relevant urologic/nephrologic pathology.  CXR to evaluate for structural/infectious intrathoracic pathology.  EKG to evaluate for cardiac pathology. Objective evaluation as below reviewed with plan for close reassessment   Initial Study Results:   Laboratory  All laboratory results reviewed without evidence of clinically relevant pathology.   Exceptions include: UA showing nitrite positive suggestive of UTI, elevated lactic acid of 2.6 consistent with sepsis, along with WBC 19.  K+ 6.0, will recheck before treating.  NA+ 127    EKG EKG was reviewed independently. Rate, rhythm, axis, intervals all examined and without medically relevant abnormality. ST segments without concerns for elevations.     Radiology  All images reviewed independently. Agree with radiology report at this time.   Portable CXR showing LLL PNA   Consults:  Case discussed with [facility] DR. Tu who agrees to see and will admit pt for further care.    Final Assessment and Plan:    8:53 PM In short patient is here with shortness of breath fever meets sepsis criteria.  Source likely due to pneumonia as there are new patchy infiltrate in the medial left lower lung  field suggestive of pneumonia of left lower lobe.  Patient test negative for COVID flu and RSV.  Urinalysis showing evidence of UTI however patient denies any urinary symptoms.  She is receiving Rocephin azithromycin which should cover for both source of infections.  Unfortunately her potassium level was elevated at 6.0, I have rechecked it several times to ensure this is not due to hemolysis and the most recent potassium it is 6.1.  EKG shows mild peaked T waves.  Will treat hyperkalemia with sodium bicarb, Lasix, Lokelma, calcium gluconate, albuterol as well as insulin.  9:32 PM Appreciate consultation from [facility] Dr. Flossie Buffy who agrees to see and will admit patient for further care.        Final Clinical Impression(s) / ED Diagnoses Final diagnoses:  Sepsis without acute organ dysfunction, due to unspecified organism [facility])  Pneumonia of left lower lobe due to infectious organism  Hyperkalemia    Rx / DC Orders ED Discharge Orders     None         Domenic Moras, PA-C 07/27/22 2153    Pattricia Boss, MD 07/27/22 [phone]

## 2022-07-27 NOTE — Assessment & Plan Note (Signed)
-  normotensive. Continue home antihypertensive

## 2022-07-27 NOTE — ED Triage Notes (Signed)
Pt [facility] for complaints of fever and shortness of breath.  Pt states she has been feeling bad for the past 4 days and has been exposed to strep.  Pt met sepsis criteria with EMS

## 2022-07-27 NOTE — Assessment & Plan Note (Signed)
-  no chest pain.

## 2022-07-27 NOTE — Assessment & Plan Note (Signed)
-  K of 6.1 on presentation. She was given IV calcium gluconate, bicarb, 10 units of NovoLog, Lasix and Lokelma in ED.  -repeat labs in the morning and would anticipate improvement

## 2022-07-27 NOTE — Assessment & Plan Note (Signed)
BMI of 34 

## 2022-07-27 NOTE — Telephone Encounter (Signed)
Pt c/o Shortness Of Breath: STAT if SOB developed within the last 24 hours or pt is noticeably SOB on the phone  1. Are you currently SOB (can you hear that pt is SOB on the phone)?   Yes  2. How long have you been experiencing SOB?   Around 9am or 10 am  3. Are you SOB when sitting or when up moving around?   When sitting  4. Are you currently experiencing any other symptoms?    Wife called stating she was taking the patient to the ER.  Wife stated patient's said her chest is really tight.

## 2022-07-27 NOTE — H&P (Signed)
History and Physical    Patient: [name] ERX:[id number] DOB: 07-Sep-1950 DOA: 07/27/2022 DOS: the patient was seen and examined on 07/27/2022 PCP: Hoyt Koch, MD  Patient coming from: Home  Chief Complaint:  Chief Complaint  Patient presents with   Shortness of Breath   HPI: [name] is a 71 y.o. female with medical history significant of CAD s/p PCI, HTN, permanent atrial fibrillation on Eliquis, CHF, DVT, COPD, T2DM who presents with increasing shortness of breath.   Wife at bedside provides history as she was adamant that I do not wake pt up from her sleep. Reports past few days she has had increase shortness of breath, mild cough. Otherwise eating and drinking well. No nausea or vomiting. Has chronic diarrhea.   In the ED, she was febrile with temperature of 100.20F, HR of 110, RR 25  and normotensive on room air. WBC of 19.6K, lactate of 2.7,  sodium of 129 with K of 6.1. creatinine of 0.90. EKG on my review in atrial fibrillation with peaked T waves.  UA with large leukocytes, +nitrate, many bacteria >50 WBC  CXR with patchy infiltrate to medial left lower lung field suggestive of pneumonia.   He was started on IV Rocephin, azithromycin.  She was given bicarb, 10 units of NovoLog and Lasix for his hyperkalemia.  Review of Systems: As mentioned in the history of present illness. All other systems reviewed and are negative. Past Medical History:  Diagnosis Date   Anemia, unspecified    Arthritis    Asthma    Atrial fibrillation with RVR ([place])    a. on Xarelto   Bell's palsy    Facial nerve decompression in 2001   CHF (congestive heart failure) (HCC)    Chronic low back pain    COPD with asthma    Coronary artery disease    Myoview 04/12/11 was entirely normal. ECHO 02/26/08 showed only minor abnormalities. Stenting 05/26/08 of her posterolateral branch to the left circumflex coronary artery. Used a 2.5x78m Taxus Monorail stent.myoview 2014 was without  ischemia   Diabetes mellitus    Type 2   Early cataracts, bilateral    Fatty liver    GERD (gastroesophageal reflux disease)    Glaucoma    Glaucoma    Goiter    Heart murmur    History of nuclear stress test 2012; 2014   lexiscan; normal pattern of perfusion; normal, low risk scan    Hyperlipidemia    Hypertension    Panic disorder    Pneumonia 2008   Polycystic ovary disease    Hysterectomy in 1982 for this   Shortness of breath dyspnea    ECHO 02/26/08 showed only minor abnormalities   Spinal stenosis    Past Surgical History:  Procedure Laterality Date   ABDOMINAL HYSTERECTOMY  1982   & BSO; for polycystic ovary disease   CARDIOVERSION N/A 12/17/2013   Procedure: CARDIOVERSION;  Surgeon: KPixie Casino MD;  [facility]  Service: Cardiovascular;  Laterality: N/A;   CARDIOVERSION N/A 09/30/2015   Procedure: CARDIOVERSION;  Surgeon: KDorothy Spark MD;  [facility] [facility]  Service: Cardiovascular;  Laterality: N/A;   CARDIOVERSION N/A 06/22/2016   Procedure: CARDIOVERSION;  Surgeon: TSkeet Latch MD;  [facility] [facility]  Service: Cardiovascular;  Laterality: N/A;   CENTRAL LINE INSERTION  01/13/2019   Procedure: CENTRAL LINE INSERTION;  Surgeon: BJolaine Artist MD;  [facility];  Service: Cardiovascular;;   COLONOSCOPY  last 2009; Dr Cristina Gong; due 2019   COLONOSCOPY WITH PROPOFOL N/A 01/23/2018   Procedure: COLONOSCOPY WITH PROPOFOL;  Surgeon: Ronald Lobo, MD;  Location: [place];  Service: Endoscopy;  Laterality: N/A;   COLONOSCOPY WITH PROPOFOL N/A 11/04/2020   Procedure: COLONOSCOPY WITH PROPOFOL;  Surgeon: Ronald Lobo, MD;  Location: [place];  Service: Endoscopy;  Laterality: N/A;   CORONARY ANGIOPLASTY  05/26/2008   Stenting of her posterolateral branch to the left circumflex coronary artery. Used a 2.5x67m Taxus Monorail stent.   ESOPHAGOGASTRODUODENOSCOPY (EGD) WITH PROPOFOL N/A 01/23/2018   Procedure:  ESOPHAGOGASTRODUODENOSCOPY (EGD) WITH PROPOFOL;  Surgeon: BRonald Lobo MD;  [facility]  Service: Endoscopy;  Laterality: N/A;   ESOPHAGOGASTRODUODENOSCOPY (EGD) WITH PROPOFOL N/A 11/04/2020   Procedure: ESOPHAGOGASTRODUODENOSCOPY (EGD) WITH PROPOFOL;  Surgeon: BRonald Lobo MD;  [facility]  Service: Endoscopy;  Laterality: N/A;   FACIAL NERVE DECOMPRESSION  2001/2002   bells palsy    GIVENS CAPSULE STUDY N/A 11/04/2020   Procedure: GIVENS CAPSULE STUDY;  Surgeon: BRonald Lobo MD;  [facility] [facility]  Service: Endoscopy;  Laterality: N/A;   HOT HEMOSTASIS N/A 11/04/2020   Procedure: HOT HEMOSTASIS (ARGON PLASMA COAGULATION/BICAP);  Surgeon: BRonald Lobo MD;  [facility]  Service: Endoscopy;  Laterality: N/A;   LAPAROSCOPIC CHOLECYSTECTOMY  06/15/2011    Dr IDalbert Batman  PERICARDIOCENTESIS N/A 01/13/2019   Procedure: PERICARDIOCENTESIS;  Surgeon: MBurnell Blanks MD;  [facility];  Service: Cardiovascular;  Laterality: N/A;   POLYPECTOMY  11/04/2020   Procedure: POLYPECTOMY;  Surgeon: BRonald Lobo MD;  [place] [facility]  Service: Endoscopy;;   RIGHT AND LEFT HEART CATH N/A 01/13/2019   Procedure: RIGHT AND LEFT HEART CATH;  Surgeon: BJolaine Artist MD;  [facility];  Service: Cardiovascular;  Laterality: N/A;   TEE WITHOUT CARDIOVERSION N/A 12/17/2013   Procedure: TRANSESOPHAGEAL ECHOCARDIOGRAM (TEE);  Surgeon: KPixie Casino MD;  [facility];  Service: Cardiovascular;  Laterality: N/A;  trish/ja   TOTAL HIP ARTHROPLASTY Left 09/25/2021   Procedure: LEFT TOTAL HIP ARTHROPLASTY ANTERIOR APPROACH;  Surgeon: RFrederik Pear MD;  [facility];  Service: Orthopedics;  Laterality: Left;   TRANSTHORACIC ECHOCARDIOGRAM  71/08/7508  LV systolic function normal with mild conc LVH; LA mildly dilated; trace MR/TR   UPPER GI ENDOSCOPY  2009   negative   Social History:  reports that she quit  smoking about 28 years ago. Her smoking use included cigarettes. She has a 45.00 pack-year smoking history. She has never used smokeless tobacco. She reports that she does not drink alcohol and does not use drugs.  Allergies  Allergen Reactions   Benadryl [Diphenhydramine Hcl] Other (See Comments)    Restless leg   Clopidogrel Bisulfate     Other reaction(s): stomach upset   Ioversol Other (See Comments)    Unknown   Benadryl [Diphenhydramine] Anxiety   Iodinated Contrast Media Palpitations and Rash    Rapid heart rate, hot Other reaction(s): rapid heartbeat  Other reaction(s): flushing    Family History  Problem Relation Age of Onset   Heart attack Father 473      2nd MI at 648  Colon cancer Brother 711  Gout Brother    Ulcers Mother    Emphysema Mother 765  Colon polyps Sister    Cancer Sister        Basal cell carcinoma   Pneumonia Maternal Grandmother    Hypertension Brother    Hyperlipidemia  Brother    Cancer Brother        Skin   Diabetes Neg Hx    Stroke Neg Hx     Prior to Admission medications   Medication Sig Start Date End Date Taking? Authorizing Provider  Accu-Chek Softclix Lancets lancets Use as instructed 11/10/21   Hoyt Koch, MD  albuterol (VENTOLIN HFA) 108 (90 Base) MCG/ACT inhaler Inhale 1 puff into the lungs every 6 (six) hours as needed for wheezing or shortness of breath.    [provider]  apixaban (ELIQUIS) 5 MG TABS tablet Take 1 tablet (5 mg total) by mouth 2 (two) times daily. 05/22/22   Lily Kocher, PA-C  ascorbic acid (VITAMIN C) 1000 MG tablet Take 1,000 mg by mouth daily.    [provider]  colestipol (COLESTID) 1 g tablet Take 0.5 g by mouth 2 (two) times daily. 02/10/20   [provider]  diltiazem (CARDIZEM CD) 180 MG 24 hr capsule TAKE ONE CAPSULE ONCE DAILY 04/02/22   Lorretta Harp, MD  diphenhydrAMINE (BENADRYL) 50 MG tablet Take 1 tablet by mouth one hour prior to scan (along with last  dose of prednisone) 06/27/22   Sherren Mocha, MD  famotidine (PEPCID) 20 MG tablet Take 20 mg by mouth 2 (two) times daily. 02/10/20   [provider]  gabapentin (NEURONTIN) 100 MG capsule TAKE ONE CAPSULE BY MOUTH THREE TIMES DAILY Patient taking differently: Take 100 mg by mouth 2 (two) times daily. 11/28/21   Hoyt Koch, MD  glucose blood (FREESTYLE LITE) test strip Use as instructed 11/10/21   Hoyt Koch, MD  guaiFENesin (MUCINEX) 600 MG 12 hr tablet Take 600 mg by mouth 2 (two) times daily.    [provider]  HYDROcodone-acetaminophen (NORCO/VICODIN) 5-325 MG tablet Take 1 tablet by mouth 2 (two) times daily as needed for moderate pain. 04/03/22   Hoyt Koch, MD  latanoprost (XALATAN) 0.005 % ophthalmic solution Place 1 drop into both eyes at bedtime. 02/07/21   [provider]  lisinopril (ZESTRIL) 20 MG tablet TAKE ONE TABLET BY MOUTH DAILY Patient taking differently: Take 20 mg by mouth daily. 01/30/22   Hoyt Koch, MD  LORazepam (ATIVAN) 0.5 MG tablet 1 po bid prn Patient taking differently: Take 0.5 mg by mouth 2 (two) times daily. 03/20/22   Plotnikov, Evie Lacks, MD  metoprolol succinate (TOPROL-XL) 100 MG 24 hr tablet TAKE ONE TABLET BY MOUTH TWICE DAILY 05/18/22   Croitoru, Dani Gobble, MD  metoprolol tartrate (LOPRESSOR) 25 MG tablet Take 1 tablet (25 mg total) by mouth daily as needed (if HR increases). 07/27/21   Croitoru, Mihai, MD  Multiple Minerals-Vitamins (CALCIUM CITRATE PLUS/MAGNESIUM PO) Take 400 mg by mouth daily.    [provider]  nystatin (MYCOSTATIN/NYSTOP) powder Apply 1 application topically once a week. 06/20/21   Hoyt Koch, MD  nystatin ointment (MYCOSTATIN) Apply 1 Application topically 2 (two) times daily as needed (IRRITATION). 07/09/22   Hoyt Koch, MD  PARoxetine (PAXIL) 10 MG tablet TAKE 2 AND 1/2 TABLETS BY MOUTH ONCE DAILY Patient taking differently: Take 25 mg by  mouth daily. 02/12/22   Hoyt Koch, MD  potassium chloride (MICRO-K) 10 MEQ CR capsule Take 1 capsule (10 mEq total) by mouth as needed (swelling and/or weight gain). 05/31/22   Almyra Deforest, PA  predniSONE (DELTASONE) 50 MG tablet Take 1 tablet by mouth 13 hours prior to scan, 7 hours prior to scan, and one hour prior  to scan 06/27/22   Sherren Mocha, MD  rOPINIRole (REQUIP XL) 2 MG 24 hr tablet Take 1 tablet (2 mg total) by mouth at bedtime. 05/29/22   Lomax, Amy, NP  rosuvastatin (CRESTOR) 10 MG tablet TAKE ONE TABLET AT BEDTIME. Patient taking differently: Take 10 mg by mouth every evening. 03/15/22   Croitoru, Mihai, MD  senna (SENOKOT) 8.6 MG TABS Take 1 tablet by mouth at bedtime as needed for mild constipation.    [provider]  sitaGLIPtin-metformin (JANUMET) 50-1000 MG tablet Take 1 tablet by mouth 2 (two) times daily with a meal. 03/09/22   Hoyt Koch, MD  torsemide (DEMADEX) 20 MG tablet Take 1 tablet (20 mg total) by mouth as needed (swelling and/or weight gain). 05/31/22   Almyra Deforest, PA  traZODone (DESYREL) 100 MG tablet Up to 200 mg at bedtime for sleep 05/29/22   Lomax, Amy, NP  triamcinolone cream (KENALOG) 0.1 % Apply 1 Application topically 2 (two) times daily. Patient taking differently: Apply 1 Application topically 2 (two) times daily as needed (Rash). 04/03/22   Hoyt Koch, MD  umeclidinium-vilanterol [facility] ELLIPTA) 62.5-25 MCG/ACT AEPB Inhale 1 puff into the lungs daily. 12/25/21   Baird Lyons D, MD    Physical Exam: Vitals:   07/27/22 1809 07/27/22 1830 07/27/22 1900 07/27/22 1930  BP:  (!) 90/49 130/60 (!) 136/98  Pulse:  100 99 98  Resp:  (!) 27 (!) 25 (!) 24  Temp: (!) 100.8 F (38.2 C)  99.6 F (37.6 C)   TempSrc: Oral  Oral   SpO2:  94% 94% 95%  Weight:      Height:       Constitutional: NAD, calm, comfortable,non-toxic appearing female laying flat in bed asleep Eyes: family did not allow for exam that could wake  her ENMT: Mucous membranes are moist.  Neck: normal, supple, Respiratory: clear to auscultation anteriorly, no wheezing, no crackles. Normal respiratory effort. No accessory muscle use.  Cardiovascular: Regular rate and rhythm, no murmurs / rubs / gallops. No extremity edema. 2+ pedal pulses.  Abdomen: Bowel sounds positive.  Musculoskeletal: no clubbing / cyanosis. No joint deformity upper and lower extremities.  Normal muscle tone.  Skin: no rashes, lesions, ulcers. No induration Neurologic: pt asleep-family did not allow for exam that could wake her. Psychiatric: unable to access with pt asleep Data Reviewed:  See HPI  Assessment and Plan: * Sepsis due to pneumonia ([place]) -presented with fever, tachycardia, tachypnea and leukocytosis with findings of Left LL pneumonia  -COVID/Flu PCR negative -continue IV Rocephin and Azithromycin  -continue IV fluid overnight, trend lactate  Hyperkalemia -K of 6.1 on presentation. She was given IV calcium gluconate, bicarb, 10 units of NovoLog, Lasix and Lokelma in ED.  -repeat labs in the morning and would anticipate improvement  DM (diabetes mellitus), type 2 ([place]) -Controlled with recent A1C of 6.8 -place on sensitive SSI  CHF (congestive heart failure) ([place]) -appears euvolemic on exam but suspect she is still hypovolemic with her hyponatremia and sepsis -continue home beta-blocker  Hyponatremia -acute on chronic hyponatremia. Na of 129 on presentation with Na similar in the past.  -check serum and urine osm, urine sodium -currently receiving IV fluid for sepsis. Follow morning labs.   Permanent atrial fibrillation (HCC) -rate controlled -continue Eliquis and beta-blocker  Obesity (BMI 30.0-34.9) BMI of 34  CAD S/P percutaneous coronary angioplasty -no chest pain.  Essential hypertension -normotensive. Continue home antihypertensive  Hyperlipidemia associated with type 2 diabetes mellitus ([place]) -  continue statin       Advance Care Planning: Full  Consults: none  Family Communication: wife at bedside  Severity of Illness: The appropriate patient status for this patient is INPATIENT. Inpatient status is judged to be reasonable and necessary in order to provide the required intensity of service to ensure the patient's safety. The patient's presenting symptoms, physical exam findings, and initial radiographic and laboratory data in the context of their chronic comorbidities is felt to place them at high risk for further clinical deterioration. Furthermore, it is not anticipated that the patient will be medically stable for discharge from the hospital within 2 midnights of admission.   * I certify that at the point of admission it is my clinical judgment that the patient will require inpatient hospital care spanning beyond 2 midnights from the point of admission due to high intensity of service, high risk for further deterioration and high frequency of surveillance required.*  Author: Orene Desanctis, DO 07/27/2022 11:33 PM  For on call review [email or web address].

## 2022-07-27 NOTE — Assessment & Plan Note (Signed)
-  presented with fever, tachycardia, tachypnea and leukocytosis with findings of Left LL pneumonia  -COVID/Flu PCR negative -continue IV Rocephin and Azithromycin  -continue IV fluid overnight, trend lactate

## 2022-07-27 NOTE — Assessment & Plan Note (Signed)
-  acute on chronic hyponatremia. Na of 129 on presentation with Na similar in the past.  -check serum and urine osm, urine sodium -currently receiving IV fluid for sepsis. Follow morning labs.

## 2022-07-28 ENCOUNTER — Other Ambulatory Visit: Payer: Self-pay

## 2022-07-28 DIAGNOSIS — A419 Sepsis, unspecified organism: Secondary | ICD-10-CM | POA: Diagnosis not present

## 2022-07-28 DIAGNOSIS — J189 Pneumonia, unspecified organism: Secondary | ICD-10-CM | POA: Diagnosis not present

## 2022-07-28 DIAGNOSIS — N3000 Acute cystitis without hematuria: Secondary | ICD-10-CM | POA: Diagnosis not present

## 2022-07-28 DIAGNOSIS — E875 Hyperkalemia: Secondary | ICD-10-CM | POA: Diagnosis not present

## 2022-07-28 DIAGNOSIS — N390 Urinary tract infection, site not specified: Secondary | ICD-10-CM | POA: Diagnosis present

## 2022-07-28 LAB — LACTIC ACID, PLASMA
Lactic Acid, Venous: 2.1 mmol/L (ref 0.5–1.9)
Lactic Acid, Venous: 1.3 mmol/L (ref 0.5–1.9)

## 2022-07-28 LAB — BASIC METABOLIC PANEL
Anion gap: 7 (ref 5–15)
BUN: 13 mg/dL (ref 8–23)
CO2: 24 mmol/L (ref 22–32)
Calcium: 8.4 mg/dL — ABNORMAL LOW (ref 8.9–10.3)
Chloride: 99 mmol/L (ref 98–111)
Creatinine, Ser: 1.07 mg/dL — ABNORMAL HIGH (ref 0.44–1.00)
GFR, Estimated: 55 mL/min — ABNORMAL LOW (ref >60)
Glucose, Bld: 161 mg/dL — ABNORMAL HIGH (ref 70–99)
Potassium: 5.2 mmol/L — ABNORMAL HIGH (ref 3.5–5.1)
Sodium: 130 mmol/L — ABNORMAL LOW (ref 135–145)

## 2022-07-28 LAB — CBC
HCT: 27.8 % — ABNORMAL LOW (ref 36.0–46.0)
Hemoglobin: 9.6 g/dL — ABNORMAL LOW (ref 12.0–15.0)
MCH: 29.4 pg (ref 26.0–34.0)
MCHC: 34.5 g/dL (ref 30.0–36.0)
MCV: 85 fL (ref 80.0–100.0)
Platelets: 164 10*3/uL (ref 150–400)
RBC: 3.27 MIL/uL — ABNORMAL LOW (ref 3.87–5.11)
RDW: 17.9 % — ABNORMAL HIGH (ref 11.5–15.5)
WBC: 15.8 10*3/uL — ABNORMAL HIGH (ref 4.0–10.5)
nRBC: 0 % (ref 0.0–0.2)

## 2022-07-28 LAB — SODIUM, URINE, RANDOM: Sodium, Ur: 16 mmol/L

## 2022-07-28 LAB — OSMOLALITY: Osmolality: 276 mOsm/kg (ref 275–295)

## 2022-07-28 LAB — GLUCOSE, CAPILLARY
Glucose-Capillary: 242 mg/dL — ABNORMAL HIGH (ref 70–99)
Glucose-Capillary: 214 mg/dL — ABNORMAL HIGH (ref 70–99)

## 2022-07-28 LAB — STREP PNEUMONIAE URINARY ANTIGEN: Strep Pneumo Urinary Antigen: NEGATIVE

## 2022-07-28 LAB — CBG MONITORING, ED: Glucose-Capillary: 165 mg/dL — ABNORMAL HIGH (ref 70–99)

## 2022-07-28 LAB — OSMOLALITY, URINE: Osmolality, Ur: 341 mOsm/kg (ref 300–900)

## 2022-07-28 MED ORDER — ACETAMINOPHEN 325 MG PO TABS
650.0000 mg | ORAL_TABLET | ORAL | Status: DC | PRN
  Administered 2022-07-29: 650 mg via ORAL
  Filled 2022-07-28: qty 2

## 2022-07-28 MED ORDER — UMECLIDINIUM-VILANTEROL 62.5-25 MCG/ACT IN AEPB
1.0000 | INHALATION_SPRAY | Freq: Every day | RESPIRATORY_TRACT | Status: DC
  Filled 2022-07-28: qty 14

## 2022-07-28 MED ORDER — TRAZODONE HCL 50 MG PO TABS
100.0000 mg | ORAL_TABLET | Freq: Every day | ORAL | Status: DC
  Administered 2022-07-28: 100 mg via ORAL
  Filled 2022-07-28: qty 2

## 2022-07-28 MED ORDER — PAROXETINE HCL 20 MG PO TABS
20.0000 mg | ORAL_TABLET | Freq: Every day | ORAL | Status: DC
  Administered 2022-07-28: 20 mg via ORAL
  Filled 2022-07-28 (×2): qty 1

## 2022-07-28 MED ORDER — METOPROLOL SUCCINATE ER 50 MG PO TB24
100.0000 mg | ORAL_TABLET | Freq: Two times a day (BID) | ORAL | Status: DC
  Administered 2022-07-28 – 2022-07-29 (×3): 100 mg via ORAL
  Filled 2022-07-28: qty 1
  Filled 2022-07-28 (×2): qty 2

## 2022-07-28 MED ORDER — PAROXETINE HCL 20 MG PO TABS: 25.0000 mg | ORAL_TABLET | Freq: Every day | ORAL | Status: DC

## 2022-07-28 MED ORDER — LATANOPROST 0.005 % OP SOLN
1.0000 [drp] | Freq: Every day | OPHTHALMIC | Status: DC
  Administered 2022-07-28: 1 [drp] via OPHTHALMIC
  Filled 2022-07-28: qty 2.5

## 2022-07-28 MED ORDER — ROSUVASTATIN CALCIUM 5 MG PO TABS
10.0000 mg | ORAL_TABLET | Freq: Every day | ORAL | Status: DC
  Administered 2022-07-28: 10 mg via ORAL
  Filled 2022-07-28: qty 2

## 2022-07-28 MED ORDER — COLESTIPOL HCL 1 G PO TABS
0.5000 g | ORAL_TABLET | Freq: Two times a day (BID) | ORAL | Status: DC
  Administered 2022-07-28 – 2022-07-29 (×2): 0.5 g via ORAL
  Filled 2022-07-28 (×5): qty 1

## 2022-07-28 MED ORDER — HYDROCODONE-ACETAMINOPHEN 5-325 MG PO TABS: 1.0000 | ORAL_TABLET | Freq: Two times a day (BID) | ORAL | Status: DC | PRN

## 2022-07-28 MED ORDER — LORAZEPAM 0.5 MG PO TABS
0.5000 mg | ORAL_TABLET | Freq: Two times a day (BID) | ORAL | Status: DC | PRN
  Administered 2022-07-28: 0.5 mg via ORAL
  Filled 2022-07-28: qty 1

## 2022-07-28 MED ORDER — FAMOTIDINE 20 MG PO TABS
20.0000 mg | ORAL_TABLET | Freq: Two times a day (BID) | ORAL | Status: DC
  Administered 2022-07-28 – 2022-07-29 (×3): 20 mg via ORAL
  Filled 2022-07-28 (×3): qty 1

## 2022-07-28 MED ORDER — PAROXETINE HCL 10 MG PO TABS
5.0000 mg | ORAL_TABLET | Freq: Every day | ORAL | Status: DC
  Administered 2022-07-28 – 2022-07-29 (×2): 5 mg via ORAL
  Filled 2022-07-28 (×2): qty 0.5

## 2022-07-28 MED ORDER — TRIAMCINOLONE ACETONIDE 0.1 % EX CREA: 1.0000 | TOPICAL_CREAM | Freq: Two times a day (BID) | CUTANEOUS | Status: DC | PRN

## 2022-07-28 MED ORDER — VITAMIN C 500 MG PO TABS
1000.0000 mg | ORAL_TABLET | Freq: Every day | ORAL | Status: DC
  Administered 2022-07-28 – 2022-07-29 (×2): 1000 mg via ORAL
  Filled 2022-07-28 (×2): qty 2

## 2022-07-28 MED ORDER — ALBUTEROL SULFATE (2.5 MG/3ML) 0.083% IN NEBU: 2.5000 mg | INHALATION_SOLUTION | Freq: Four times a day (QID) | RESPIRATORY_TRACT | Status: DC | PRN

## 2022-07-28 NOTE — Progress Notes (Signed)
[facility]                                                                              [name], is a 71 y.o. female, DOB - 1950/09/22, [id number] Admit date - 07/27/2022    Outpatient Primary MD for the patient is Hoyt Koch, MD  LOS - 1  days  Chief Complaint  Patient presents with   Shortness of Breath       Brief summary   Patient is a 71 year old female with CAD status post PCI, HTN, permanent A-fib on Eliquis, CHF DVT, COPD, diabetes mellitus type 2 presented with increasing shortness of breath.  Reported past few days of increasing shortness of breath, coughing, no nausea or vomiting.  Has chronic diarrhea otherwise eating and drinking well. In ED, temp 100.8 F, HR 110s, RR 25, normotensive.  WBCs 19.6, lactate 2.7, sodium 129, potassium 6.1, creatinine 0.9.  EKG showed atrial fibrillation with peaked T waves.  UA positive for UTI.  Chest x-ray showed patchy infiltrates in the medial left lower lung suggestive of pneumonia. Patient was admitted for further workup.  Assessment & Plan    Principal Problem:   Sepsis due to left lower lung pneumonia, community-acquired pneumonia ([place]), POA -Patient met sepsis criteria on admission with fever, tachycardia, tachypnea, leukocytosis, lactic acidosis with findings of left lower lung pneumonia -COVID, flu PCR negative -Continue IV Zithromax, Rocephin -Follow urine strep antigen, urine Legionella antigen -Home O2 evaluation prior to discharge.  Active Problems:   UTI (urinary tract infection) -Follow urine culture and sensitivities, continue IV Rocephin    Hyperkalemia -Potassium 6.1 on presentation, received IV calcium gluconate, bicarb, 10 units NovoLog insulin, Lasix and Lokelma in ED -Potassium improving, 5.2  Chronic hyponatremia -Follow serum osmolality, urine osmolality, UNA -Presented with sodium of 129, improving, appears to have chronic hyponatremia    Hyperlipidemia associated  with type 2 diabetes mellitus ([place]) -Continue statin    Essential hypertension -BP currently stable    CAD S/P percutaneous coronary angioplasty -No chest pain, no acute shortness of breath,  -Continue beta-blocker    Permanent atrial fibrillation (HCC) -BP currently soft, will resume beta-blocker, continue to hold Cardizem -Continue Eliquis for anticoagulation  History of diastolic CHF (congestive heart failure) (HCC) -Currently euvolemic, patient reports that she does not take torsemide daily -2D echo 05/20/2022 showed EF of 60 to 65%, no WMA    DM (diabetes mellitus), type 2 ([place]), NIDDM -Hemoglobin 6.8 on 06/20/2022 -Hold Janumet -Continue SSI while inpatient    Obesity (BMI 30.0-34.9) Estimated body mass index is 34.2 kg/m as calculated from the following:   Height as of this encounter: _0  (1.575 m).   Weight as of this encounter: 84.8 kg.  Code Status: Full code DVT Prophylaxis:   apixaban (ELIQUIS) tablet 5 mg   Level of Care: Level of care: Telemetry Medical Family Communication: Updated patient's significant other at the bedside   Disposition Plan:      Remains inpatient appropriate: Hopefully DC home in a.m. if continues to improve and cultures available   Procedures:  None  Consultants:   None  Antimicrobials:  Anti-infectives (From admission, onward)    Start     Dose/Rate Route Frequency Ordered Stop   07/27/22 1815  cefTRIAXone (ROCEPHIN) 2 g in sodium chloride 0.9 % 100 mL IVPB        2 g 200 mL/hr over 30 Minutes Intravenous Every 24 hours 07/27/22 1812 08/01/22 1814   07/27/22 1815  azithromycin (ZITHROMAX) 500 mg in sodium chloride 0.9 % 250 mL IVPB        500 mg 250 mL/hr over 60 Minutes Intravenous Every 24 hours 07/27/22 1812 08/01/22 1814          Medications  albuterol  10 mg Nebulization Once   apixaban  5 mg Oral BID   ascorbic acid  1,000 mg Oral Daily   colestipol  0.5 g Oral BID   famotidine  20 mg Oral BID   insulin  aspart  0-9 Units Subcutaneous TID WC   latanoprost  1 drop Both Eyes QHS   metoprolol succinate  100 mg Oral BID   PARoxetine  25 mg Oral Daily   rOPINIRole  1 mg Oral BID   rosuvastatin  10 mg Oral QHS   traZODone  100 mg Oral QHS   umeclidinium-vilanterol  1 puff Inhalation Daily      Subjective:   [name] was seen and examined today.  Feeling better since admission no acute chest pain or shortness of breath.  No fevers this morning, noted to have slight hypoxia during conversation  Objective:   Vitals:   07/28/22 0800 07/28/22 0815 07/28/22 0830 07/28/22 0845  BP: (!) 115/57     Pulse: 89 78 (!) 112 90  Resp: 17 (!) 27 (!) 23 17  Temp:      TempSrc:      SpO2: 96% (!) 79% 96% 92%  Weight:      Height:        Intake/Output Summary (Last 24 hours) at 07/28/2022 1103 Last data filed at 07/28/2022 0825 Gross per 24 hour  Intake 2746.45 ml  Output --  Net 2746.45 ml     Wt Readings from Last 3 Encounters:  07/27/22 84.8 kg  06/27/22 87.5 kg  06/06/22 87.1 kg     Exam General: Alert and oriented x 3, NAD Cardiovascular: S1 S2 auscultated,  RRR Respiratory: Diminished breath bases L>R, no wheezing Gastrointestinal: Soft, nontender, nondistended, + bowel sounds Ext: no pedal edema bilaterally Neuro: no new FND's Psych: Normal affect    Data Reviewed:  I have personally reviewed following labs    CBC Lab Results  Component Value Date   WBC 15.8 (H) 07/28/2022   RBC 3.27 (L) 07/28/2022   HGB 9.6 (L) 07/28/2022   HCT 27.8 (L) 07/28/2022   MCV 85.0 07/28/2022   MCH 29.4 07/28/2022   PLT 164 07/28/2022   MCHC 34.5 07/28/2022   RDW 17.9 (H) 07/28/2022   LYMPHSABS 0.3 (L) 07/27/2022   MONOABS 1.1 (H) 07/27/2022   EOSABS 0.0 07/27/2022   BASOSABS 0.1 28/76/8115     Last metabolic panel Lab Results  Component Value Date   NA 130 (L) 07/28/2022   K 5.2 (H) 07/28/2022   CL 99 07/28/2022   CO2 24 07/28/2022   BUN 13 07/28/2022   CREATININE  1.07 (H) 07/28/2022   GLUCOSE 161 (H) 07/28/2022   GFRNONAA 55 (L) 07/28/2022   GFRAA 42 (L) 03/08/2020   CALCIUM 8.4 (L) 07/28/2022   PHOS 3.3 03/05/2021   PROT 7.2 07/27/2022   ALBUMIN 3.9 07/27/2022  LABGLOB 3.3 11/17/2019   AGRATIO 1.3 11/17/2019   BILITOT 0.5 07/27/2022   ALKPHOS 50 07/27/2022   AST 26 07/27/2022   ALT 16 07/27/2022   ANIONGAP 7 07/28/2022    CBG (last 3)  Recent Labs    07/28/22 0831  GLUCAP 165*      Coagulation Profile: Recent Labs  Lab 07/27/22 1817  INR 1.2     Radiology Studies: I have personally reviewed the imaging studies  DG Chest Port 1 View  Result Date: 07/27/2022 CLINICAL DATA:  Possible sepsis, shortness of breath EXAM: PORTABLE CHEST 1 VIEW COMPARISON:  Previous studies including the examination of 10/18/2021 FINDINGS: Transverse diameter of heart is increased. There are no signs of pulmonary edema. Increased markings are seen in the medial left lower lung field. Small transverse linear densities in the lateral aspect of left lower lung fields may suggest scarring or subsegmental atelectasis. There is no pleural effusion or pneumothorax. IMPRESSION: There new patchy infiltrate in the medial left lower lung fields suggesting possible pneumonia in left lower lobe. Cardiomegaly. Electronically Signed   By: Elmer Picker M.D.   On: 07/27/2022 18:46       Savanna Dooley M.D. [facility] 07/28/2022, 11:03 AM  Available via Epic secure chat 7am-7pm After 7 pm, please refer to night coverage provider listed on amion.

## 2022-07-28 NOTE — Evaluation (Signed)
Physical Therapy Evaluation Patient Details Name: [name] MRN: [id number] DOB: 1951-05-13 Today's Date: 07/28/2022  History of Present Illness  Patient is a 71 year old female admitted 12/15 with increasing shortness of breath. Found to have Sepsis due to left lower lung pneumonia, community-acquired pneumonia and UTI. CAD status post PCI, HTN, permanent A-fib on Eliquis, CHF DVT, COPD, diabetes mellitus type 2.  Clinical Impression  Pt admitted with above diagnosis. Mobilizing well, near baseline. Reports some prior balance issues (is agreeable to OPPT.) Ambulates >200 feet in hallway today with supervision using RW for light support. Has been furniture walking at home. Hx of fall last year resulting in hip fx per pt. Will have adequate support at home upon d/c.  Will follow and work with pt until medically stable to d/c.       Recommendations for follow up therapy are one component of a multi-disciplinary discharge planning process, led by the attending physician.  Recommendations may be updated based on patient status, additional functional criteria and insurance authorization.  Follow Up Recommendations Outpatient PT      Assistance Recommended at Discharge Set up Supervision/Assistance  Patient can return home with the following  A little help with bathing/dressing/bathroom;Assistance with cooking/housework;Assist for transportation    Equipment Recommendations None recommended by PT  Recommendations for Other Services       Functional Status Assessment Patient has had a recent decline in their functional status and demonstrates the ability to make significant improvements in function in a reasonable and predictable amount of time.     Precautions / Restrictions Precautions Precautions: None Restrictions Weight Bearing Restrictions: No      Mobility  Bed Mobility Overal bed mobility: Needs Assistance Bed Mobility: Supine to Sit     Supine to sit: Min assist      General bed mobility comments: Min assist for patient to pull self up through therapist hand. Cues throughout.    Transfers Overall transfer level: Needs assistance Equipment used: Rolling walker (2 wheels) Transfers: Sit to/from Stand Sit to Stand: Min guard           General transfer comment: Min guard for safety. cues for hand placement. No physical assist required. Performed from bed and recliner.    Ambulation/Gait Ambulation/Gait assistance: Supervision Gait Distance (Feet): 215 Feet Assistive device: Rolling walker (2 wheels) Gait Pattern/deviations: Step-through pattern, Wide base of support, Decreased stride length Gait velocity: decreased Gait velocity interpretation: <1.8 ft/sec, indicate of risk for recurrent falls   General Gait Details: Educated on safe AD use with RW. Cues for upright posture and RW use for stability. No overt LOB noted with this device. Declines attempt without, feels that she is unsteady.  Stairs            Wheelchair Mobility    Modified Rankin (Stroke Patients Only)       Balance Overall balance assessment: Mild deficits observed, not formally tested                                           Pertinent Vitals/Pain Pain Assessment Pain Assessment: No/denies pain    Home Living Family/patient expects to be discharged to:: Private residence Living Arrangements: Spouse/significant other Available Help at Discharge: Family;Available 24 hours/day Type of Home: House Home Access: Level entry       Home Layout: One level Home Equipment: Conservation officer, nature (2 wheels);Cane - single  point;BSC/3in1      Prior Function Prior Level of Function : Independent/Modified Independent                     Hand Dominance   Dominant Hand: Left    Extremity/Trunk Assessment   Upper Extremity Assessment Upper Extremity Assessment: Defer to OT evaluation    Lower Extremity Assessment Lower Extremity  Assessment: Generalized weakness       Communication   Communication: No difficulties  Cognition Arousal/Alertness: Awake/alert Behavior During Therapy: WFL for tasks assessed/performed Overall Cognitive Status: Within Functional Limits for tasks assessed                                          General Comments      Exercises     Assessment/Plan    PT Assessment Patient needs continued PT services  PT Problem List Decreased strength;Decreased balance;Decreased mobility;Decreased knowledge of use of DME;Obesity       PT Treatment Interventions DME instruction;Gait training;Functional mobility training;Therapeutic activities;Therapeutic exercise;Balance training;Neuromuscular re-education;Patient/family education    PT Goals (Current goals can be found in the Care Plan section)  Acute Rehab PT Goals Patient Stated Goal: Get well go home PT Goal Formulation: With patient Time For Goal Achievement: 08/04/22 Potential to Achieve Goals: Good    Frequency Min 3X/week     Co-evaluation               AM-PAC PT "6 Clicks" Mobility  Outcome Measure Help needed turning from your back to your side while in a flat bed without using bedrails?: None Help needed moving from lying on your back to sitting on the side of a flat bed without using bedrails?: A Little Help needed moving to and from a bed to a chair (including a wheelchair)?: A Little Help needed standing up from a chair using your arms (e.g., wheelchair or bedside chair)?: A Little Help needed to walk in hospital room?: A Little Help needed climbing 3-5 steps with a railing? : A Little 6 Click Score: 19    End of Session Equipment Utilized During Treatment: Gait belt Activity Tolerance: Patient tolerated treatment well Patient left: in chair;with call bell/phone within reach;with family/visitor present   PT Visit Diagnosis: Unsteadiness on feet (R26.81);Other abnormalities of gait and mobility  (R26.89);Muscle weakness (generalized) (M62.81)    Time: 2330-0762 PT Time Calculation (min) (ACUTE ONLY): 24 min   Charges:   PT Evaluation $PT Eval Low Complexity: 1 Low PT Treatments $Gait Training: 8-22 mins        Candie Mile, PT, DPT Physical Therapist [facility] [place] 07/28/2022, 4:40 PM

## 2022-07-29 ENCOUNTER — Encounter: Payer: Self-pay | Admitting: Internal Medicine

## 2022-07-29 DIAGNOSIS — J189 Pneumonia, unspecified organism: Secondary | ICD-10-CM | POA: Diagnosis not present

## 2022-07-29 DIAGNOSIS — A419 Sepsis, unspecified organism: Secondary | ICD-10-CM | POA: Diagnosis not present

## 2022-07-29 DIAGNOSIS — E875 Hyperkalemia: Secondary | ICD-10-CM | POA: Diagnosis not present

## 2022-07-29 LAB — CBC
HCT: 28.6 % — ABNORMAL LOW (ref 36.0–46.0)
Hemoglobin: 9.8 g/dL — ABNORMAL LOW (ref 12.0–15.0)
MCH: 29.1 pg (ref 26.0–34.0)
MCHC: 34.3 g/dL (ref 30.0–36.0)
MCV: 84.9 fL (ref 80.0–100.0)
Platelets: 148 10*3/uL — ABNORMAL LOW (ref 150–400)
RBC: 3.37 MIL/uL — ABNORMAL LOW (ref 3.87–5.11)
RDW: 18.3 % — ABNORMAL HIGH (ref 11.5–15.5)
WBC: 7.9 10*3/uL (ref 4.0–10.5)
nRBC: 0 % (ref 0.0–0.2)

## 2022-07-29 LAB — BASIC METABOLIC PANEL
Anion gap: 7 (ref 5–15)
BUN: 9 mg/dL (ref 8–23)
CO2: 23 mmol/L (ref 22–32)
Calcium: 8.4 mg/dL — ABNORMAL LOW (ref 8.9–10.3)
Chloride: 100 mmol/L (ref 98–111)
Creatinine, Ser: 0.89 mg/dL (ref 0.44–1.00)
GFR, Estimated: >60 mL/min (ref >60)
Glucose, Bld: 231 mg/dL — ABNORMAL HIGH (ref 70–99)
Potassium: 4.3 mmol/L (ref 3.5–5.1)
Sodium: 130 mmol/L — ABNORMAL LOW (ref 135–145)

## 2022-07-29 LAB — GLUCOSE, CAPILLARY
Glucose-Capillary: 204 mg/dL — ABNORMAL HIGH (ref 70–99)
Glucose-Capillary: 198 mg/dL — ABNORMAL HIGH (ref 70–99)

## 2022-07-29 MED ORDER — AZITHROMYCIN 500 MG PO TABS: 500.0000 mg | ORAL_TABLET | Freq: Every day | ORAL | 0 refills | Status: AC

## 2022-07-29 MED ORDER — LISINOPRIL 20 MG PO TABS: 20.0000 mg | ORAL_TABLET | Freq: Every day | ORAL | 1 refills | Status: DC

## 2022-07-29 MED ORDER — AZITHROMYCIN 500 MG PO TABS
500.0000 mg | ORAL_TABLET | Freq: Every day | ORAL | Status: DC
  Administered 2022-07-29: 500 mg via ORAL
  Filled 2022-07-29: qty 1

## 2022-07-29 MED ORDER — BENZONATATE 100 MG PO CAPS
100.0000 mg | ORAL_CAPSULE | Freq: Three times a day (TID) | ORAL | Status: DC
  Administered 2022-07-29: 100 mg via ORAL
  Filled 2022-07-29: qty 1

## 2022-07-29 MED ORDER — CEFPODOXIME PROXETIL 200 MG PO TABS: 200.0000 mg | ORAL_TABLET | Freq: Two times a day (BID) | ORAL | 0 refills | Status: AC

## 2022-07-29 MED ORDER — BENZONATATE 100 MG PO CAPS: 100.0000 mg | ORAL_CAPSULE | Freq: Three times a day (TID) | ORAL | 0 refills | Status: DC | PRN

## 2022-07-29 MED ORDER — GUAIFENESIN-DM 100-10 MG/5ML PO SYRP: 5.0000 mL | ORAL_SOLUTION | ORAL | Status: DC | PRN

## 2022-07-29 MED ORDER — GUAIFENESIN-DM 100-10 MG/5ML PO SYRP: 5.0000 mL | ORAL_SOLUTION | ORAL | 0 refills | Status: DC | PRN

## 2022-07-29 MED ORDER — UNABLE TO FIND: 0 refills | Status: DC

## 2022-07-29 NOTE — Plan of Care (Signed)

## 2022-07-29 NOTE — Discharge Summary (Signed)
Physician Discharge Summary   Patient: [name] MRN: [id number] DOB: Jan 22, 1951  Admit date:     07/27/2022  Discharge date: 07/29/22  Discharge Physician: Estill Cotta, MD    PCP: Hoyt Koch, MD   Recommendations at discharge:   Continue Zithromax 500 mg daily for 5 more days Vantin 200 mg twice daily for 5 days Follow urine culture and sensitivities Lisinopril held, follow-up BMET   Discharge Diagnoses:    Sepsis due to pneumonia ([place])   Hyperkalemia   UTI (urinary tract infection)  Hyponatremia   Hyperlipidemia associated with type 2 diabetes mellitus ([place])   Essential hypertension   CAD S/P percutaneous coronary angioplasty   Obesity (BMI 30.0-34.9)   Permanent atrial fibrillation (HCC)   CHF (congestive heart failure) ([place])   DM (diabetes mellitus), type 2 [facility])     Hospital Course:  Patient is a 71 year old female with CAD status post PCI, HTN, permanent A-fib on Eliquis, CHF DVT, COPD, diabetes mellitus type 2 presented with increasing shortness of breath.  Reported past few days of increasing shortness of breath, coughing, no nausea or vomiting.  Has chronic diarrhea otherwise eating and drinking well. In ED, temp 100.8 F, HR 110s, RR 25, normotensive.  WBCs 19.6, lactate 2.7, sodium 129, potassium 6.1, creatinine 0.9.  EKG showed atrial fibrillation with peaked T waves.  UA positive for UTI.  Chest x-ray showed patchy infiltrates in the medial left lower lung suggestive of pneumonia. Patient was admitted for further workup.   Assessment and Plan:  Sepsis due to left lower lung pneumonia, community-acquired pneumonia ([place]), POA -Patient met sepsis criteria on admission with fever, tachycardia, tachypnea, leukocytosis, lactic acidosis with findings of left lower lung pneumonia -COVID, flu PCR negative -Patient was placed on IV Zithromax, IV Rocephin, transitioned to oral Zithromax and Vantin for 5 more days -Urine strep antigen negative, O2 sats  97 to 98% on room air Ambulating well without difficulty    UTI (urinary tract infection) -Urine culture showed gram-negative rods, patient was placed on IV Rocephin, transition to oral Vantin for 5 days which will cover for UTI     Hyperkalemia -Potassium 6.1 on presentation, received IV calcium gluconate, bicarb, 10 units NovoLog insulin, Lasix and Lokelma in ED -Resolved with potassium 4.3 on discharge   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, -Presented with sodium of 129, appears to have chronic hyponatremia -Sodium 130 time of discharge -Osmolarity 276, urine osmolality 341, urine sodium 16     Hyperlipidemia associated with type 2 diabetes mellitus ([place]) -Continue statin     Essential hypertension -BP currently stable     CAD S/P percutaneous coronary angioplasty -No chest pain, no acute shortness of breath,  -Continue beta-blocker     Permanent atrial fibrillation (HCC) -BP improved, continue beta-blocker, Cardizem -Continue Eliquis for anticoagulation   History of diastolic CHF (congestive heart failure) (HCC) -Currently euvolemic, patient reports that she does not take torsemide daily -2D echo 05/20/2022 showed EF of 60 to 65%, no WMA     DM (diabetes mellitus), type 2 ([place]), NIDDM -Hemoglobin 6.8 on 06/20/2022 -Resume Janumet      Obesity (BMI 30.0-34.9) Estimated body mass index is 34.2 kg/m as calculated from the following:   Height as of this encounter: _0  (1.575 m).   Weight as of this encounter: 84.8 kg.     Pain control - [employer] Controlled Substance Reporting System database was reviewed. and patient was instructed, not to drive, operate heavy machinery, perform activities  at heights, swimming or participation in water activities or provide baby-sitting services while on Pain, Sleep and Anxiety Medications; until their outpatient Physician has advised to do so again. Also recommended to not to take more than prescribed Pain, Sleep and  Anxiety Medications.  Consultants: none  Procedures performed: none   Disposition: Home Diet recommendation:  Discharge Diet Orders (From admission, onward)     Start     Ordered   07/29/22 0000  Diet Carb Modified        07/29/22 0939           Carb modified diet DISCHARGE MEDICATION: Allergies as of 07/29/2022       Reactions   Benadryl [diphenhydramine Hcl] Other (See Comments)   Restless leg   Clopidogrel Bisulfate    Other reaction(s): stomach upset   Ioversol Other (See Comments)   Unknown   Benadryl [diphenhydramine] Anxiety   Iodinated Contrast Media Palpitations, Rash   Rapid heart rate, hot Other reaction(s): rapid heartbeat Other reaction(s): flushing        Medication List     STOP taking these medications    predniSONE 50 MG tablet Commonly known as: DELTASONE       TAKE these medications    Accu-Chek Softclix Lancets lancets Use as instructed What changed:  how much to take how to take this when to take this   albuterol 108 (90 Base) MCG/ACT inhaler Commonly known as: VENTOLIN HFA Inhale 1 puff into the lungs every 6 (six) hours as needed for wheezing or shortness of breath.   apixaban 5 MG Tabs tablet Commonly known as: ELIQUIS Take 1 tablet (5 mg total) by mouth 2 (two) times daily.   ascorbic acid 1000 MG tablet Commonly known as: VITAMIN C Take 1,000 mg by mouth daily.   azithromycin 500 MG tablet Commonly known as: ZITHROMAX Take 1 tablet (500 mg total) by mouth daily for 5 days.   benzonatate 100 MG capsule Commonly known as: TESSALON Take 1 capsule (100 mg total) by mouth 3 (three) times daily as needed for cough.   CALCIUM CITRATE PLUS/MAGNESIUM PO Take 400 mg by mouth daily.   cefpodoxime 200 MG tablet Commonly known as: VANTIN Take 1 tablet (200 mg total) by mouth 2 (two) times daily for 5 days.   colestipol 1 g tablet Commonly known as: COLESTID Take 0.5 g by mouth 2 (two) times daily.   diltiazem 180 MG  24 hr capsule Commonly known as: CARDIZEM CD TAKE ONE CAPSULE ONCE DAILY   diphenhydrAMINE 50 MG tablet Commonly known as: BENADRYL Take 1 tablet by mouth one hour prior to scan (along with last dose of prednisone)   famotidine 20 MG tablet Commonly known as: PEPCID Take 20 mg by mouth 2 (two) times daily.   FREESTYLE LITE test strip Generic drug: glucose blood Use as instructed What changed:  how much to take how to take this when to take this   gabapentin 100 MG capsule Commonly known as: NEURONTIN TAKE ONE CAPSULE BY MOUTH THREE TIMES DAILY What changed: when to take this   guaiFENesin 600 MG 12 hr tablet Commonly known as: MUCINEX Take 600 mg by mouth 2 (two) times daily.   guaiFENesin-dextromethorphan 100-10 MG/5ML syrup Commonly known as: ROBITUSSIN DM Take 5 mLs by mouth every 4 (four) hours as needed for cough. Also available OTC   HYDROcodone-acetaminophen 5-325 MG tablet Commonly known as: NORCO/VICODIN Take 1 tablet by mouth 2 (two) times daily as needed for moderate pain.  Janumet 50-1000 MG tablet Generic drug: sitaGLIPtin-metformin Take 1 tablet by mouth 2 (two) times daily with a meal.   latanoprost 0.005 % ophthalmic solution Commonly known as: XALATAN Place 1 drop into both eyes at bedtime.   lisinopril 20 MG tablet Commonly known as: ZESTRIL Take 1 tablet (20 mg total) by mouth daily. HOLD until follow-up with your doctor What changed: additional instructions   LORazepam 0.5 MG tablet Commonly known as: ATIVAN 1 po bid prn What changed:  how much to take how to take this when to take this additional instructions   metoprolol succinate 100 MG 24 hr tablet Commonly known as: TOPROL-XL TAKE ONE TABLET BY MOUTH TWICE DAILY   metoprolol tartrate 25 MG tablet Commonly known as: LOPRESSOR Take 1 tablet (25 mg total) by mouth daily as needed (if HR increases).   nystatin powder Commonly known as: MYCOSTATIN/NYSTOP Apply 1 application  topically once a week.   nystatin ointment Commonly known as: MYCOSTATIN Apply 1 Application topically 2 (two) times daily as needed (IRRITATION).   PARoxetine 10 MG tablet Commonly known as: PAXIL TAKE 2 AND 1/2 TABLETS BY MOUTH ONCE DAILY What changed: See the new instructions.   potassium chloride 10 MEQ CR capsule Commonly known as: MICRO-K Take 1 capsule (10 mEq total) by mouth as needed (swelling and/or weight gain).   rOPINIRole 2 MG 24 hr tablet Commonly known as: REQUIP XL Take 1 tablet (2 mg total) by mouth at bedtime.   rosuvastatin 10 MG tablet Commonly known as: CRESTOR TAKE ONE TABLET AT BEDTIME. What changed: when to take this   senna 8.6 MG Tabs tablet Commonly known as: SENOKOT Take 1 tablet by mouth at bedtime as needed for mild constipation.   torsemide 20 MG tablet Commonly known as: DEMADEX Take 1 tablet (20 mg total) by mouth as needed (swelling and/or weight gain).   traZODone 100 MG tablet Commonly known as: DESYREL Up to 200 mg at bedtime for sleep What changed:  how much to take how to take this when to take this reasons to take this additional instructions   triamcinolone cream 0.1 % Commonly known as: KENALOG Apply 1 Application topically 2 (two) times daily. What changed:  when to take this reasons to take this   umeclidinium-vilanterol 62.5-25 MCG/ACT Aepb Commonly known as: Anoro Ellipta Inhale 1 puff into the lungs daily.   UNABLE TO FIND OUTPATIENT PHYSICAL THERAPY   Diagnosis: Generalized debility, pneumonia, UTI        Follow-up Information     Hoyt Koch, MD. Schedule an appointment as soon as possible for a visit in 2 week(s).   Specialty: Internal Medicine Why: for hospital follow-up Contact information: [place] [address] [phone]                Discharge Exam: Danley Danker Weights   07/27/22 1756  Weight: 84.8 kg   S: Sitting up in the chair, O2 sat 97 to 98% on  room air, no chest pain, shortness of breath, fevers, feels ready to go home  BP (!) 143/66 (BP Location: Left Arm)   Pulse 91   Temp 98 F (36.7 C)   Resp 18   Ht _0  (1.575 m)   Wt 84.8 kg   SpO2 98%   BMI 34.20 kg/m   Physical Exam General: Alert and oriented x 3, NAD Cardiovascular: S1 S2 clear, RRR.  Respiratory: CTAB, no wheezing, rales or rhonchi Gastrointestinal: Soft, nontender, nondistended, NBS Ext:  no pedal edema bilaterally Neuro: no new deficits Psych: Normal affect   Condition at discharge: fair  The results of significant diagnostics from this hospitalization (including imaging, microbiology, ancillary and laboratory) are listed below for reference.   Imaging Studies: DG Chest Port 1 View  Result Date: 07/27/2022 CLINICAL DATA:  Possible sepsis, shortness of breath EXAM: PORTABLE CHEST 1 VIEW COMPARISON:  Previous studies including the examination of 10/18/2021 FINDINGS: Transverse diameter of heart is increased. There are no signs of pulmonary edema. Increased markings are seen in the medial left lower lung field. Small transverse linear densities in the lateral aspect of left lower lung fields may suggest scarring or subsegmental atelectasis. There is no pleural effusion or pneumothorax. IMPRESSION: There new patchy infiltrate in the medial left lower lung fields suggesting possible pneumonia in left lower lobe. Cardiomegaly. Electronically Signed   By: Elmer Picker M.D.   On: 07/27/2022 18:46    Microbiology: Results for orders placed or performed during the hospital encounter of 07/27/22  Resp panel by RT-PCR (RSV, Flu A&B, Covid) Anterior Nasal Swab     Status: None   Collection Time: 07/27/22  6:11 PM   Specimen: Anterior Nasal Swab  Result Value Ref Range Status   SARS Coronavirus 2 by RT PCR NEGATIVE NEGATIVE Final    Comment: (NOTE) SARS-CoV-2 target nucleic acids are NOT DETECTED.  The SARS-CoV-2 RNA is generally detectable in upper  respiratory specimens during the acute phase of infection. The lowest concentration of SARS-CoV-2 viral copies this assay can detect is 138 copies/mL. A negative result does not preclude SARS-Cov-2 infection and should not be used as the sole basis for treatment or other patient management decisions. A negative result may occur with  improper specimen collection/handling, submission of specimen other than nasopharyngeal swab, presence of viral mutation(s) within the areas targeted by this assay, and inadequate number of viral copies(<138 copies/mL). A negative result must be combined with clinical observations, patient history, and epidemiological information. The expected result is Negative.  Fact Sheet for Patients:  EntrepreneurPulse.com.au  Fact Sheet for Healthcare Providers:  IncredibleEmployment.be  This test is no t yet approved or cleared by the [place] FDA and  has been authorized for detection and/or diagnosis of SARS-CoV-2 by FDA under an Emergency Use Authorization (EUA). This EUA will remain  in effect (meaning this test can be used) for the duration of the COVID-19 declaration under Section 564(b)(1) of the Act, 21 U.S.C.section 360bbb-3(b)(1), unless the authorization is terminated  or revoked sooner.       Influenza A by PCR NEGATIVE NEGATIVE Final   Influenza B by PCR NEGATIVE NEGATIVE Final    Comment: (NOTE) The Xpert Xpress SARS-CoV-2/FLU/RSV plus assay is intended as an aid in the diagnosis of influenza from Nasopharyngeal swab specimens and should not be used as a sole basis for treatment. Nasal washings and aspirates are unacceptable for Xpert Xpress SARS-CoV-2/FLU/RSV testing.  Fact Sheet for Patients: EntrepreneurPulse.com.au  Fact Sheet for Healthcare Providers: IncredibleEmployment.be  This test is not yet approved or cleared by the [place] FDA and has been  authorized for detection and/or diagnosis of SARS-CoV-2 by FDA under an Emergency Use Authorization (EUA). This EUA will remain in effect (meaning this test can be used) for the duration of the COVID-19 declaration under Section 564(b)(1) of the Act, 21 U.S.C. section 360bbb-3(b)(1), unless the authorization is terminated or revoked.     Resp Syncytial Virus by PCR NEGATIVE NEGATIVE Final    Comment: (NOTE) Fact  Sheet for Patients: EntrepreneurPulse.com.au  Fact Sheet for Healthcare Providers: IncredibleEmployment.be  This test is not yet approved or cleared by the [place] FDA and has been authorized for detection and/or diagnosis of SARS-CoV-2 by FDA under an Emergency Use Authorization (EUA). This EUA will remain in effect (meaning this test can be used) for the duration of the COVID-19 declaration under Section 564(b)(1) of the Act, 21 U.S.C. section 360bbb-3(b)(1), unless the authorization is terminated or revoked.  Performed at [facility], [place] [address], [place], [place] [address]   Urine Culture     Status: Abnormal (Preliminary result)   Collection Time: 07/27/22  6:11 PM   Specimen: In/Out Cath Urine  Result Value Ref Range Status   Specimen Description IN/OUT CATH URINE  Final   Special Requests NONE  Final   Culture (A)  Final    >=100,000 COLONIES/mL GRAM NEGATIVE RODS SUSCEPTIBILITIES TO FOLLOW Performed at [facility], [place] [address] [place], [place] [address]    Report Status PENDING  Incomplete  Blood Culture (routine x 2)     Status: None (Preliminary result)   Collection Time: 07/27/22  6:16 PM   Specimen: BLOOD  Result Value Ref Range Status   Specimen Description BLOOD SITE NOT SPECIFIED  Final   Special Requests   Final    BOTTLES DRAWN AEROBIC AND ANAEROBIC Blood Culture adequate volume   Culture   Final    NO GROWTH 2 DAYS Performed at [facility], [place] [address] [place],  [place] [address]    Report Status PENDING  Incomplete  Culture, blood (Routine X 2) w Reflex to ID Panel     Status: None (Preliminary result)   Collection Time: 07/27/22  6:30 PM   Specimen: BLOOD  Result Value Ref Range Status   Specimen Description BLOOD SITE NOT SPECIFIED  Final   Special Requests   Final    BOTTLES DRAWN AEROBIC AND ANAEROBIC Blood Culture results may not be optimal due to an excessive volume of blood received in culture bottles   Culture   Final    NO GROWTH 2 DAYS Performed at [facility], [place] [address] [place], [place] [address]    Report Status PENDING  Incomplete   *Note: Due to a large number of results and/or encounters for the requested time period, some results have not been displayed. A complete set of results can be found in Results Review.    Labs: CBC: Recent Labs  Lab 07/27/22 1817 07/27/22 1943 07/27/22 2048 07/28/22 0225 07/29/22 0239  WBC 19.6*  --   --  15.8* 7.9  NEUTROABS 18.1*  --   --   --   --   HGB 12.0 12.2 11.6* 9.6* 9.8*  HCT 33.9* 36.0 34.0* 27.8* 28.6*  MCV 84.1  --   --  85.0 84.9  PLT 176  --   --  164 013*   Basic Metabolic Panel: Recent Labs  Lab 07/27/22 1817 07/27/22 1943 07/27/22 2048 07/28/22 0225 07/29/22 0239  NA 127* 129* 129* 130* 130*  K 6.0* 6.0* 6.1* 5.2* 4.3  CL 99 101 99 99 100  CO2 18*  --   --  24 23  GLUCOSE 263* 265* 259* 161* 231*  BUN _0 CREATININE 0.96 0.80 0.90 1.07* 0.89  CALCIUM 8.9  --   --  8.4* 8.4*   Liver Function Tests: Recent Labs  Lab 07/27/22 1817  AST  26  ALT 16  ALKPHOS 50  BILITOT 0.5  PROT 7.2  ALBUMIN 3.9   CBG: Recent Labs  Lab 07/28/22 0831 07/28/22 1747 07/28/22 1957 07/29/22 0736  GLUCAP 165* 242* 214* 204*    Discharge time spent: greater than 30 minutes.  Signed: Estill Cotta, MD [facility] 07/29/2022

## 2022-07-29 NOTE — Progress Notes (Signed)
Nursing DC note  Patients ride will be here around 1 pm

## 2022-07-30 ENCOUNTER — Telehealth: Payer: Self-pay

## 2022-07-30 LAB — LEGIONELLA PNEUMOPHILA SEROGP 1 UR AG: L. pneumophila Serogp 1 Ur Ag: NEGATIVE

## 2022-07-30 LAB — URINE CULTURE: Culture: >=100000 — AB

## 2022-07-30 NOTE — Telephone Encounter (Signed)
Transition Care Management Unsuccessful Follow-up Telephone Call  Date of discharge and from where:  07/27/2022 @ [facility]   Attempts:  1st Attempt  Reason for unsuccessful TCM follow-up call:  Left voice message

## 2022-07-31 ENCOUNTER — Ambulatory Visit: Payer: HMO | Admitting: Internal Medicine

## 2022-08-01 LAB — CULTURE, BLOOD (ROUTINE X 2)
Culture: NO GROWTH
Culture: NO GROWTH
Special Requests: ADEQUATE

## 2022-08-02 MED ORDER — HYDROCODONE-ACETAMINOPHEN 5-325 MG PO TABS: 1.0000 | ORAL_TABLET | Freq: Two times a day (BID) | ORAL | 0 refills | Status: DC | PRN

## 2022-08-02 NOTE — Addendum Note (Signed)
Addended by: Pricilla Holm A on: 08/02/2022 01:17 PM   Modules accepted: Orders

## 2022-08-11 ENCOUNTER — Other Ambulatory Visit: Payer: Self-pay | Admitting: Internal Medicine

## 2022-08-14 ENCOUNTER — Encounter: Payer: Self-pay | Admitting: Internal Medicine

## 2022-08-14 DIAGNOSIS — R051 Acute cough: Secondary | ICD-10-CM | POA: Diagnosis not present

## 2022-08-14 DIAGNOSIS — U071 COVID-19: Secondary | ICD-10-CM | POA: Diagnosis not present

## 2022-08-14 NOTE — Telephone Encounter (Signed)
Left message from Dr Sharlet Salina on patients voicemail and explained to give [place] a call back if she have any other questions or concerns.

## 2022-08-15 NOTE — Telephone Encounter (Signed)
Left voicemail for patient to give the office a call back letting us know about there decision

## 2022-08-17 ENCOUNTER — Encounter: Payer: Self-pay | Admitting: Internal Medicine

## 2022-08-17 ENCOUNTER — Encounter: Payer: Self-pay | Admitting: Podiatry

## 2022-08-17 ENCOUNTER — Ambulatory Visit (INDEPENDENT_AMBULATORY_CARE_PROVIDER_SITE_OTHER): Payer: HMO | Admitting: Podiatry

## 2022-08-17 ENCOUNTER — Ambulatory Visit (INDEPENDENT_AMBULATORY_CARE_PROVIDER_SITE_OTHER): Payer: PPO | Admitting: Internal Medicine

## 2022-08-17 VITALS — BP 137/67

## 2022-08-17 VITALS — BP 100/60 | HR 58 | Temp 97.9°F | Wt 188.0 lb

## 2022-08-17 DIAGNOSIS — M1612 Unilateral primary osteoarthritis, left hip: Secondary | ICD-10-CM

## 2022-08-17 DIAGNOSIS — B351 Tinea unguium: Secondary | ICD-10-CM

## 2022-08-17 DIAGNOSIS — G2581 Restless legs syndrome: Secondary | ICD-10-CM

## 2022-08-17 DIAGNOSIS — I5032 Chronic diastolic (congestive) heart failure: Secondary | ICD-10-CM

## 2022-08-17 DIAGNOSIS — L600 Ingrowing nail: Secondary | ICD-10-CM | POA: Diagnosis not present

## 2022-08-17 DIAGNOSIS — M79675 Pain in left toe(s): Secondary | ICD-10-CM | POA: Diagnosis not present

## 2022-08-17 DIAGNOSIS — D500 Iron deficiency anemia secondary to blood loss (chronic): Secondary | ICD-10-CM | POA: Diagnosis not present

## 2022-08-17 DIAGNOSIS — E1142 Type 2 diabetes mellitus with diabetic polyneuropathy: Secondary | ICD-10-CM | POA: Diagnosis not present

## 2022-08-17 DIAGNOSIS — M79674 Pain in right toe(s): Secondary | ICD-10-CM

## 2022-08-17 DIAGNOSIS — J9611 Chronic respiratory failure with hypoxia: Secondary | ICD-10-CM

## 2022-08-17 MED ORDER — HYDROCODONE-ACETAMINOPHEN 5-325 MG PO TABS: 1.0000 | ORAL_TABLET | Freq: Two times a day (BID) | ORAL | 0 refills | Status: DC | PRN

## 2022-08-17 MED ORDER — LORAZEPAM 0.5 MG PO TABS: 0.5000 mg | ORAL_TABLET | Freq: Two times a day (BID) | ORAL | 5 refills | Status: DC

## 2022-08-17 MED ORDER — BENZONATATE 100 MG PO CAPS: 100.0000 mg | ORAL_CAPSULE | Freq: Three times a day (TID) | ORAL | 0 refills | Status: DC | PRN

## 2022-08-17 MED ORDER — METOPROLOL SUCCINATE ER 100 MG PO TB24: 100.0000 mg | ORAL_TABLET | Freq: Two times a day (BID) | ORAL | 3 refills | Status: DC

## 2022-08-17 NOTE — Patient Instructions (Addendum)
We have refilled the tessalon perles. Double the torsemide for 2-3 days.

## 2022-08-17 NOTE — Assessment & Plan Note (Signed)
Worse recently potentially due to dropping Hg levels.

## 2022-08-17 NOTE — Progress Notes (Signed)
Subjective:  Patient ID: [name], female    DOB: [birth year],  MRN: [id number]  [name] presents to clinic today for at risk foot care with history of diabetic neuropathy and painful, discolored, thick toenails which interfere with daily activities  Chief Complaint  Patient presents with   Nail Problem    DFC BS-did not check today A1C-6.8 PCP-Crawford, Elizabeth PCP [facility]- 2 or 3 months ago   New problem(s): ingrown toenail to the medial border of L 2nd toe for the past several days. She states toe is tender to touch. She denies any drainage, but digit is mildly swollen.   She had right 2nd toe ingrown toenail removed by Dr. Paulla Dolly a few weeks ago and states she is doing well post-procedure.   She states her RLS is bothering her today.  PCP is Hoyt Koch, MD.  Allergies  Allergen Reactions   Benadryl [Diphenhydramine Hcl] Other (See Comments)    Restless leg   Clopidogrel Bisulfate     Other reaction(s): stomach upset   Ioversol Other (See Comments)    Unknown   Benadryl [Diphenhydramine] Anxiety   Iodinated Contrast Media Palpitations and Rash    Rapid heart rate, hot Other reaction(s): rapid heartbeat  Other reaction(s): flushing    Review of Systems: Negative except as noted in the HPI.  Objective: No changes noted in today's physical examination. Vitals:   08/17/22 1143  BP: 137/67   [name] is a pleasant 73 y.o. female obese in NAD. AAO x 3. Vascular CFT <3 seconds b/l LE. Palpable DP pulse(s) b/l LE. Palpable PT pulse(s) b/l LE. Pedal hair sparse. No pain with calf compression b/l. Varicosities present b/l.  Neurologic Normal speech. Oriented to person, place, and time. Pt has subjective symptoms of neuropathy. Protective sensation intact 5/5 intact bilaterally with 10g monofilament b/l. Vibratory sensation intact b/l.  Dermatologic Pedal skin is warm and supple b/l LE. No open wounds b/l LE. No interdigital macerations noted b/l LE.  Toenails 3-5 bilaterally elongated, discolored, dystrophic, thickened, and crumbly with subungual debris and tenderness to dorsal palpation. Anonychia noted bilateral great toes. Nailbed(s) epithelialized.   Incurvated nailplate medial border of L 2nd toe.  Nail border hypertrophy present. There is evidence of subacute process with dried serosanguinous drainage present. There is tenderness to palpation. Sign(s) of infection: no clinical signs of infection noted on examination today. No erythema, no edema, no drainage, no fluctuance.  Procedure site of R 2nd toe noted to be healing as expected with no erythema, no edema, no drainage, no purulence.   Orthopedic: Muscle strength 5/5 to all lower extremity muscle groups bilaterally. 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 Utilizes transport chair for ambulation assistance.   Radiographs: None  Assessment/Plan: 1. Ingrown nail   2. Pain due to onychomycosis of toenails of both feet   3. Pain in toe of left foot   4. Diabetic polyneuropathy associated with type 2 diabetes mellitus ([place])     No orders of the defined types were placed in this encounter.   None -Consent given for treatment as described below: -Examined patient. -Continue foot and shoe inspections daily. Monitor blood glucose per PCP/Endocrinologist's recommendations. -Toenails 2-5 left foot and 3-5 right foot debrided in length and girth without iatrogenic bleeding with sterile nail nipper and dremel.  -No invasive procedure(s) performed. Offending nail border debrided and curretaged L 2nd toe utilizing sterile nail nipper and currette. Border(s) cleansed with alcohol  and triple antibiotic ointment applied. Dispensed written instructions for once daily epsom salt soaks for 7 days. Call office if there are any concerns. -Discussed treatment for ingrown toenail(s): temporary vs chemical matrixectomy of offending border(s) of L 2nd toe. She  recently had a procedure performed on her right 2nd toe and desires same procedure for left 2nd toe with Dr. Paulla Dolly. Patient will schedule at his/her convenience. -Patient/POA to call should there be question/concern in the interim.   Return in about 3 months (around 11/16/2022).  Marzetta Board, DPM

## 2022-08-17 NOTE — Assessment & Plan Note (Signed)
Hg dropping toward the end of hospital stay which could have been dilutional (10 pounds up from dry weight). Will need recheck with labs on Wednesday with cardiology.

## 2022-08-17 NOTE — Progress Notes (Signed)
   Subjective:   Patient ID: [name], female    DOB: 25-Oct-1950, 72 y.o.   MRN: [id number]  HPI The patient is a 72 YO female coming in for follow up hospital (in for CAP with dyspnea, given antibiotics and fluids, was up 10 pounds on discharge). She had significant dyspnea and swelling due to fluid on discharge and gradually has improved. Tested positive for covid-19 about 3 days ago and taking paxlovid currently.   PMH, [facility], social history reviewed and updated  Review of Systems  Constitutional:  Positive for activity change, appetite change and fatigue.  HENT: Negative.    Eyes: Negative.   Respiratory:  Positive for cough and shortness of breath. Negative for chest tightness.   Cardiovascular:  Positive for leg swelling. Negative for chest pain and palpitations.  Gastrointestinal:  Positive for abdominal distention. Negative for abdominal pain, constipation, diarrhea, nausea and vomiting.  Musculoskeletal:  Positive for arthralgias, back pain, gait problem and myalgias.  Skin: Negative.   Neurological:  Positive for weakness.  Psychiatric/Behavioral: Negative.      Objective:  Physical Exam Constitutional:      Appearance: She is well-developed. She is obese.  HENT:     Head: Normocephalic and atraumatic.  Cardiovascular:     Rate and Rhythm: Normal rate and regular rhythm.  Pulmonary:     Effort: Pulmonary effort is normal. No respiratory distress.     Breath sounds: Normal breath sounds. No wheezing or rales.  Abdominal:     General: Bowel sounds are normal. There is no distension.     Palpations: Abdomen is soft.     Tenderness: There is no abdominal tenderness. There is no rebound.  Musculoskeletal:     Cervical back: Normal range of motion.     Right lower leg: Edema present.     Left lower leg: Edema present.  Skin:    General: Skin is warm and dry.  Neurological:     Mental Status: She is alert and oriented to person, place, and time.     Motor: Weakness  present.     Coordination: Coordination abnormal.     Vitals:   08/17/22 1622  BP: 100/60  Pulse: (!) 58  Temp: 97.9 F (36.6 C)  TempSrc: Oral  SpO2: 98%  Weight: 188 lb (85.3 kg)    Assessment & Plan:  Visit time 25 minutes in face to face communication with patient and coordination of care, additional 15 minutes spent in record review, coordination or care, ordering tests, communicating/referring to other healthcare professionals, documenting in medical records all on the same day of the visit for total time 40 minutes spent on the visit.

## 2022-08-17 NOTE — Assessment & Plan Note (Signed)
With flare today and is above dry weight. She discharged about 10 pounds over dry weight. She is advised to double torsemide to 40 mg daily for 2-3 days and then get BMP with cardiology when she sees them next Wednesday.

## 2022-08-17 NOTE — Assessment & Plan Note (Signed)
Rx hydrocodone 5/325 #60 no refills which is her 1 month supply. We will continue to reassess if this is still needed in same amount going forward.

## 2022-08-17 NOTE — Assessment & Plan Note (Signed)
Stable today but recent exacerbation with CAP in the hospital and then covid-19 shortly thereafter she is still under treatment for.

## 2022-08-20 ENCOUNTER — Ambulatory Visit: Payer: HMO | Admitting: Podiatry

## 2022-08-22 ENCOUNTER — Encounter: Payer: Self-pay | Admitting: Cardiovascular Disease

## 2022-08-22 ENCOUNTER — Ambulatory Visit: Payer: HMO | Attending: Cardiovascular Disease | Admitting: Cardiovascular Disease

## 2022-08-22 VITALS — BP 144/52 | HR 74 | Ht 62.0 in | Wt 184.2 lb

## 2022-08-22 DIAGNOSIS — I10 Essential (primary) hypertension: Secondary | ICD-10-CM

## 2022-08-22 DIAGNOSIS — D6869 Other thrombophilia: Secondary | ICD-10-CM

## 2022-08-22 DIAGNOSIS — I4811 Longstanding persistent atrial fibrillation: Secondary | ICD-10-CM | POA: Diagnosis not present

## 2022-08-22 DIAGNOSIS — I5032 Chronic diastolic (congestive) heart failure: Secondary | ICD-10-CM

## 2022-08-22 DIAGNOSIS — E1169 Type 2 diabetes mellitus with other specified complication: Secondary | ICD-10-CM | POA: Diagnosis not present

## 2022-08-22 DIAGNOSIS — E785 Hyperlipidemia, unspecified: Secondary | ICD-10-CM | POA: Diagnosis not present

## 2022-08-22 DIAGNOSIS — I700 Atherosclerosis of aorta: Secondary | ICD-10-CM

## 2022-08-22 DIAGNOSIS — I2510 Atherosclerotic heart disease of native coronary artery without angina pectoris: Secondary | ICD-10-CM

## 2022-08-22 DIAGNOSIS — D649 Anemia, unspecified: Secondary | ICD-10-CM

## 2022-08-22 NOTE — Progress Notes (Signed)
Cardiology Office Note:    Date:  09/11/2022   ID:  [name], DOB July 22, 1951, MRN [id number]  PCP:  Hoyt Koch, MD  Cardiologist:  Cieanna Stormes  (CHF clinic, [place]) Electrophysiologist:  None   Referring MD: Hoyt Koch, *   No chief complaint on file.    History of Present Illness:    [name] is a 72 y.o. female with a hx of CAD (posterior lateral ventricular branch stent in 2009), longstanding persistent atrial fibrillation, diabetes mellitus type 2, hypertension, pericardiocentesis for hemothorax with tamponade in June 2020, while on treatment with oral anticoagulant. She is a retired Electrical engineer.  Compared to her last appointment she has less fatigue.  She denies angina or dyspnea.  She has not had lower extremity edema.  Has not had overt bleeding.  Labs checked on today's visit showed that the hemoglobin has improved slightly, up to 11.2, from 9.8 last month.  Continues to have at least NYHA functional class II shortness of breath on exertion.  She does not have orthopnea or PND (habitually sleeps in a recliner due to orthopedic issues).  Denies lower extremity edema.  Reports she is doing pretty well with avoiding salt rich foods.  Denies palpitations or syncope.  Has not had any falls.  Weight has been steady around 183 pounds, which is higher than our previous estimation of her dry weight of 170 pounds, but it is possible that she has gained some true weight since she has been steady around 183-187 pounds for the last few encounters.  Roughly a year ago she was hospitalized for lower GI bleeding with bright blood blood per rectum and a hemoglobin as low as 6.  Work-up with colonoscopy, upper endoscopy and eventually capsule enteroscopy did not really show [place] clear source of bleeding, but there were a couple of AVMs in the colon, 1 in the cecum that appeared to be friable.  This was treated with electrocautery.  She has been on anticoagulation  since then without overt bleeding problems.  Her most recent hemoglobin was 10.8 on 03/26/2022  Glycemic control has been fair with a hemoglobin A1c of 7.9%.  Her LDL cholesterol most recently was very good at 36.  She had a nuclear stress test in June 12, 2021 showed low risk findings without evidence of ischemia or previous infarction.  Her last echocardiogram in April 2021 showed LVEF of 50-55%.  This was a decline from June 2020 when the EF was 60%.  The most recent echocardiogram performed in April 2021 showed LVEF of 50-55%.  Both atria are moderately dilated.  There were no serious valvular abnormalities.  She had a normal perfusion pattern on her nuclear stress tests from 2012, 2014, 2016 and October 2022.  Past Medical History:  Diagnosis Date   Anemia, unspecified    Arthritis    Asthma    Atrial fibrillation with RVR ([place])    a. on Xarelto   Bell's palsy    Facial nerve decompression in 2001   CHF (congestive heart failure) (HCC)    Chronic low back pain    COPD with asthma    Coronary artery disease    Myoview 04/12/11 was entirely normal. ECHO 02/26/08 showed only minor abnormalities. Stenting 05/26/08 of her posterolateral branch to the left circumflex coronary artery. Used a 2.5x93m Taxus Monorail stent.myoview 2014 was without ischemia   Diabetes mellitus    Type 2   Early cataracts, bilateral    Fatty liver  GERD (gastroesophageal reflux disease)    Glaucoma    Glaucoma    Goiter    Heart murmur    History of nuclear stress test 2012; 2014   lexiscan; normal pattern of perfusion; normal, low risk scan    Hyperlipidemia    Hypertension    Panic disorder    Pneumonia 2008   Polycystic ovary disease    Hysterectomy in 1982 for this   Shortness of breath dyspnea    ECHO 02/26/08 showed only minor abnormalities   Spinal stenosis     Past Surgical History:  Procedure Laterality Date   ABDOMINAL HYSTERECTOMY  1982   & BSO; for polycystic ovary disease    CARDIOVERSION N/A 12/17/2013   Procedure: CARDIOVERSION;  Surgeon: Pixie Casino, MD;  Location: [place];  Service: Cardiovascular;  Laterality: N/A;   CARDIOVERSION N/A 09/30/2015   Procedure: CARDIOVERSION;  Surgeon: Dorothy Spark, MD;  Location: [facility] ENDOSCOPY;  Service: Cardiovascular;  Laterality: N/A;   CARDIOVERSION N/A 06/22/2016   Procedure: CARDIOVERSION;  Surgeon: Skeet Latch, MD;  Location: [facility] ENDOSCOPY;  Service: Cardiovascular;  Laterality: N/A;   CENTRAL LINE INSERTION  01/13/2019   Procedure: CENTRAL LINE INSERTION;  Surgeon: Jolaine Artist, MD;  Location: [place] CV LAB;  Service: Cardiovascular;;   COLONOSCOPY     last 2009; Dr Cristina Gong; due 2019   COLONOSCOPY WITH PROPOFOL N/A 01/23/2018   Procedure: COLONOSCOPY WITH PROPOFOL;  Surgeon: Ronald Lobo, MD;  Location: [place];  Service: Endoscopy;  Laterality: N/A;   COLONOSCOPY WITH PROPOFOL N/A 11/04/2020   Procedure: COLONOSCOPY WITH PROPOFOL;  Surgeon: Ronald Lobo, MD;  Location: [place];  Service: Endoscopy;  Laterality: N/A;   CORONARY ANGIOPLASTY  05/26/2008   Stenting of her posterolateral branch to the left circumflex coronary artery. Used a 2.5x15m Taxus Monorail stent.   ESOPHAGOGASTRODUODENOSCOPY (EGD) WITH PROPOFOL N/A 01/23/2018   Procedure: ESOPHAGOGASTRODUODENOSCOPY (EGD) WITH PROPOFOL;  Surgeon: BRonald Lobo MD;  [place]  Service: Endoscopy;  Laterality: N/A;   ESOPHAGOGASTRODUODENOSCOPY (EGD) WITH PROPOFOL N/A 11/04/2020   Procedure: ESOPHAGOGASTRODUODENOSCOPY (EGD) WITH PROPOFOL;  Surgeon: BRonald Lobo MD;  [place]  Service: Endoscopy;  Laterality: N/A;   FACIAL NERVE DECOMPRESSION  2001/2002   bells palsy    GIVENS CAPSULE STUDY N/A 11/04/2020   Procedure: GIVENS CAPSULE STUDY;  Surgeon: BRonald Lobo MD;  [place]  Service: Endoscopy;  Laterality: N/A;   HOT HEMOSTASIS N/A 11/04/2020   Procedure: HOT HEMOSTASIS (ARGON  PLASMA COAGULATION/BICAP);  Surgeon: BRonald Lobo MD;  [facility]  Service: Endoscopy;  Laterality: N/A;   LAPAROSCOPIC CHOLECYSTECTOMY  06/15/2011    Dr IDalbert Batman  PERICARDIOCENTESIS N/A 01/13/2019   Procedure: PERICARDIOCENTESIS;  Surgeon: MBurnell Blanks MD;  [place];  Service: Cardiovascular;  Laterality: N/A;   POLYPECTOMY  11/04/2020   Procedure: POLYPECTOMY;  Surgeon: BRonald Lobo MD;  [place]  Service: Endoscopy;;   RIGHT AND LEFT HEART CATH N/A 01/13/2019   Procedure: RIGHT AND LEFT HEART CATH;  Surgeon: BJolaine Artist MD;  [facility];  Service: Cardiovascular;  Laterality: N/A;   TEE WITHOUT CARDIOVERSION N/A 12/17/2013   Procedure: TRANSESOPHAGEAL ECHOCARDIOGRAM (TEE);  Surgeon: KPixie Casino MD;  [facility];  Service: Cardiovascular;  Laterality: N/A;  trish/ja   TOTAL HIP ARTHROPLASTY Left 09/25/2021   Procedure: LEFT TOTAL HIP ARTHROPLASTY ANTERIOR APPROACH;  Surgeon: RFrederik Pear MD;  [facility];  Service: Orthopedics;  Laterality: Left;  TRANSTHORACIC ECHOCARDIOGRAM  4/81/8563   LV systolic function normal with mild conc LVH; LA mildly dilated; trace MR/TR   UPPER GI ENDOSCOPY  2009   negative    Current Medications: Current Meds  Medication Sig   Accu-Chek Softclix Lancets lancets Use as instructed (Patient taking differently: 1 each by Other route See admin instructions. Use as instructed)   apixaban (ELIQUIS) 5 MG TABS tablet Take 1 tablet (5 mg total) by mouth 2 (two) times daily.   ascorbic acid (VITAMIN C) 1000 MG tablet Take 1,000 mg by mouth daily.   benzonatate (TESSALON) 100 MG capsule Take 1 capsule (100 mg total) by mouth 3 (three) times daily as needed for cough.   colestipol (COLESTID) 1 g tablet Take 0.5 g by mouth 2 (two) times daily.   diltiazem (CARDIZEM CD) 180 MG 24 hr capsule TAKE ONE CAPSULE ONCE DAILY   famotidine (PEPCID) 20 MG tablet Take 20 mg  by mouth 2 (two) times daily.   glucose blood (FREESTYLE LITE) test strip Use as instructed (Patient taking differently: 1 each by Other route See admin instructions. Use as instructed)   guaiFENesin (MUCINEX) 600 MG 12 hr tablet Take 600 mg by mouth 2 (two) times daily.   guaiFENesin-dextromethorphan (ROBITUSSIN DM) 100-10 MG/5ML syrup Take 5 mLs by mouth every 4 (four) hours as needed for cough. Also available OTC   HYDROcodone-acetaminophen (NORCO/VICODIN) 5-325 MG tablet Take 1 tablet by mouth 2 (two) times daily as needed for moderate pain.   latanoprost (XALATAN) 0.005 % ophthalmic solution Place 1 drop into both eyes at bedtime.   lisinopril (ZESTRIL) 20 MG tablet Take 1 tablet (20 mg total) by mouth daily. HOLD until follow-up with your doctor   LORazepam (ATIVAN) 0.5 MG tablet Take 1 tablet (0.5 mg total) by mouth 2 (two) times daily.   metoprolol succinate (TOPROL-XL) 100 MG 24 hr tablet Take 1 tablet (100 mg total) by mouth 2 (two) times daily.   Multiple Minerals-Vitamins (CALCIUM CITRATE PLUS/MAGNESIUM PO) Take 400 mg by mouth daily.   nystatin (MYCOSTATIN/NYSTOP) powder Apply 1 application topically once a week.   nystatin ointment (MYCOSTATIN) Apply 1 Application topically 2 (two) times daily as needed (IRRITATION).   PARoxetine (PAXIL) 10 MG tablet TAKE 2 AND 1/2 TABLETS BY MOUTH ONCE DAILY (Patient taking differently: Take 25 mg by mouth daily.)   rOPINIRole (REQUIP XL) 2 MG 24 hr tablet Take 1 tablet (2 mg total) by mouth at bedtime.   rosuvastatin (CRESTOR) 10 MG tablet TAKE ONE TABLET AT BEDTIME. (Patient taking differently: Take 10 mg by mouth every evening.)   sitaGLIPtin-metformin (JANUMET) 50-1000 MG tablet Take 1 tablet by mouth 2 (two) times daily with a meal.   torsemide (DEMADEX) 20 MG tablet Take 1 tablet (20 mg total) by mouth as needed (swelling and/or weight gain).   traZODone (DESYREL) 100 MG tablet Up to 200 mg at bedtime for sleep (Patient taking differently: Take  100 mg by mouth at bedtime as needed for sleep.)   triamcinolone cream (KENALOG) 0.1 % Apply 1 Application topically 2 (two) times daily. (Patient taking differently: Apply 1 Application topically 2 (two) times daily as needed (Rash).)   umeclidinium-vilanterol (ANORO ELLIPTA) 62.5-25 MCG/ACT AEPB Inhale 1 puff into the lungs daily.     Allergies:   Benadryl [diphenhydramine hcl], Clopidogrel bisulfate, Ioversol, Benadryl [diphenhydramine], and Iodinated contrast media   Social History   Socioeconomic History   Marital status: Married    Spouse name: Not on file  Number of children: 2   Years of education: master's   Highest education level: Not on file  Occupational History   Occupation: Electrical engineer    Employer: [employer]  Tobacco Use   Smoking status: Former    Packs/day: 1.50    Years: 30.00    Total pack years: 45.00    Types: Cigarettes    Quit date: 08/13/1993    Years since quitting: 29.0   Smokeless tobacco: Never  Vaping Use   Vaping Use: Never used  Substance and Sexual Activity   Alcohol use: No   Drug use: No   Sexual activity: Not on file  Other Topics Concern   Not on file  Social History Narrative   ** Merged History Encounter **       Social Determinants of Health   Financial Resource Strain: Not on file  Food Insecurity: No Food Insecurity (07/28/2022)   Hunger Vital Sign    Worried About Running Out of Food in the Last Year: Never true    [place] in the Last Year: Never true  Transportation Needs: No Transportation Needs (07/28/2022)   PRAPARE - Hydrologist (Medical): No    Lack of Transportation (Non-Medical): No  Physical Activity: Not on file  Stress: Not on file  Social Connections: Not on file     Family History: The patient's family history includes Cancer in her brother and sister; Colon cancer (age of onset: 69) in her brother; Colon polyps in her sister; Emphysema (age of onset:  3) in her mother; Gout in her brother; Heart attack (age of onset: 73) in her father; Hyperlipidemia in her brother; Hypertension in her brother; Pneumonia in her maternal grandmother; Ulcers in her mother. There is no history of Diabetes or Stroke.  ROS:   Please see the history of present illness.    All other systems are reviewed and are negative.   EKGs/Labs/Other Studies Reviewed:    The following studies were reviewed today: Echocardiogram December 09, 2019  1. Abnormal septal motion and hypokinesis as well as inferior basal  hypokinesis EF similar to echo done 05/2019 or slightly better . Left  ventricular ejection fraction, by estimation, is 50 to 55%. The left  ventricle has low normal function. The left  ventricle demonstrates regional wall motion abnormalities (see scoring  diagram/findings for description). Left ventricular diastolic parameters  are indeterminate.   2. Right ventricular systolic function is normal. The right ventricular  size is normal.   3. Left atrial size was moderately dilated.   4. Right atrial size was moderately dilated.   5. The mitral valve is degenerative. Trivial mitral valve regurgitation.  No evidence of mitral stenosis.   6. The aortic valve is tricuspid. Aortic valve regurgitation is trivial.  Mild to moderate aortic valve sclerosis/calcification is present, without  any evidence of aortic stenosis.   7. The inferior vena cava is normal in size with greater than 50%  respiratory variability, suggesting right atrial pressure of 3 mmHg.   Nuclear stress test 06/12/2021   Findings are consistent with no prior ischemia and no prior myocardial infarction. The study is low risk.   No ST deviation was noted.   LV perfusion is normal. There is no evidence of ischemia. There is no evidence of infarction.   End diastolic cavity size is normal. End systolic cavity size is normal.   Normal perfusion EF may not be accurate due to  afib estimated at 51%  no RWMAls noted     EKG:  EKG is not ordered today.  Personally reviewed her ECG from 02/22/2022 which shows atrial fibrillation with controlled ventricular response at 87 bpm.  No ischemic repolarization abnormalities are seen.  She has had varying degrees of left axis deviation, sometimes meeting criteria for left anterior fascicular block  Recent Labs:  02/22/2022 hemoglobin A1c 7.9% 10/18/2021: Pro B Natriuretic peptide (BNP) 108.0 05/18/2022: BNP 102.2 08/29/2022: Hemoglobin 11.3; Platelets 211; TSH 2.625 08/31/2022: ALT 14; BUN 17; Creatinine, Ser 0.81; Magnesium 1.3; Potassium 4.9; Sodium 130   11/14/2020  Creatinine 1.33, potassium 4.3, hemoglobin 10.3 Recent Lipid Panel    Component Value Date/Time   CHOL 101 06/20/2022 1504   CHOL 110 05/18/2022 1623   TRIG 171.0 (H) 06/20/2022 1504   HDL 41.30 06/20/2022 1504   HDL 44 05/18/2022 1623   CHOLHDL 2 06/20/2022 1504   VLDL 34.2 06/20/2022 1504   LDLCALC 25 06/20/2022 1504   LDLCALC 33 05/18/2022 1623   LDLDIRECT 143.4 01/23/2008 1316    Physical Exam:    VS:  BP (!) 144/52 (BP Location: Left Arm, Patient Position: Sitting, Cuff Size: Large)   Pulse 74   Ht '5\' 2"'$  (1.575 m)   Wt 184 lb 3.2 oz (83.6 kg)   SpO2 96%   BMI 33.69 kg/m     Wt Readings from Last 3 Encounters:  08/31/22 183 lb 2 oz (83.1 kg)  08/29/22 184 lb 4.9 oz (83.6 kg)  08/23/22 184 lb 3.2 oz (83.6 kg)     General: Alert, oriented x3, no distress, she does not appear to be pale Head: no evidence of trauma, PERRL, EOMI, no exophtalmos or lid lag, no myxedema, no xanthelasma; normal ears, nose and oropharynx Neck: Hard to see her jugular venous pulsations and no hepatojugular reflux; brisk carotid pulses without delay and no carotid bruits Chest: clear to auscultation, no signs of consolidation by percussion or palpation, normal fremitus, symmetrical and full respiratory excursions Cardiovascular: normal position and quality of the apical impulse,  irregular rhythm, normal first and second heart sounds, no murmurs, rubs or gallops Abdomen: no tenderness or distention, no masses by palpation, no abnormal pulsatility or arterial bruits, normal bowel sounds, no hepatosplenomegaly Extremities: no clubbing, cyanosis or edema; 2+ radial, ulnar and brachial pulses bilaterally; 2+ right femoral, posterior tibial and dorsalis pedis pulses; 2+ left femoral, posterior tibial and dorsalis pedis pulses; no subclavian or femoral bruits Neurological: grossly nonfocal Psych: Normal mood and affect    ASSESSMENT:    1. Longstanding persistent atrial fibrillation ([place])   2. Chronic diastolic CHF (congestive heart failure) ([place])   3. Acquired thrombophilia ([place])   4. Anemia, unspecified type   5. Coronary artery disease involving native coronary artery of native heart without angina pectoris   6. Essential hypertension   7. Hyperlipidemia associated with type 2 diabetes mellitus ([place])   8. Aortic atherosclerosis (HCC)        PLAN:    In order of problems listed above:  AFib: Has always been oblivious to palpitations.  Has good rate control.  Back on anticoagulation without active bleeding at this time.  Rate control is critical since she has a history of tachycardia cardiomyopathy.   CHA2DS2-VASc 6 (age, gender, hypertension, diabetes, CAD, history of heart failure/LV dysfunction).  We have previously discussed alternatives to anticoagulation.  I believe she is an excellent candidate for a Watchman device.  She was skeptical at her last appointment.  Reinforced my recommendation today, but she is very ambivalent.  She does not want to pursue this at this time.  Even though she is gone for extended periods of time without active bleeding, eventually she developed symptomatic anemia, several times in the last few years. CHF: Very sedentary, so hard to use her functional status as a guide.  Not sure exactly how high she is above her "dry weight"  because  she seems to have gained some true weight.    She is on SGLT2 inhibitor, albeit not 1 that is approved for CHF.  On beta-blockers and ACE inhibitor.  Preserved LVEF.  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  Reinforced importance of sodium restriction, signs and symptoms of heart failure were discussed. Anticoagulation: Need to check her hemoglobin periodically due to her history of recurrent iron deficiency anemia due to GI bleeding.  I encouraged her to reconsider a Watchman device. CAD: Asymptomatic.  Low risk recent nuclear stress test.  Near normal LVEF. HTN: Fair control.  Even though her systolic blood pressure is a little high, she has a remarkably low diastolic blood pressure.  I would not push her medications any further. HLP: Excellent LDL on statin.  Chronically low HDL. DM: Borderline adequate glycemic control with hemoglobin A1c 7.9%. Periodic limb movement syndrome: Saw Dr. Brett Fairy for sleep clinic evaluation in March 2021.  On Requip.  COPD: Saw Dr. Annamaria Boots in January 2022.  FEV1 not too bad at 1.66 L / 82% of predicted. Aortic atherosclerosis: Aortic calcification with normal caliber aorta seen on chest x-ray and chest CT. continue statin therapy.  I have seen SAVANNHA WELLE is a 72 y.o. female in the office today who is being considered for a Watchman left atrial appendage closure device.  She has a history of permanent atrial fibrillation.  This patients CHA2DS2-VASc Score and unadjusted Ischemic Stroke Rate (% per year) is equal to 9.7 % stroke rate/year from a score of 6 which necessitates long term oral anticoagulation to prevent stroke.  Unfortunately, She is not felt to be a long term Warfarin candidate secondary to recurrent iron deficiency anemia due to GI bleeding.  The patients chart has been reviewed and I feel that they would be a candidate for short term oral anticoagulation.  Procedural risks for the Watchman implant have been reviewed with the patient including a  1% risk of stroke, 2% risk of perforation, 0.1% risk of device embolization.  Given the patient's poor candidacy for long-term oral anticoagulation and ability to tolerate short term oral anticoagulation I have recommended the watchman left atrial appendage closure system.   Medication Adjustments/Labs and Tests Ordered: Current medicines are reviewed at length with the patient today.  Concerns regarding medicines are outlined above.  Orders Placed This Encounter  Procedures   CBC    No orders of the defined types were placed in this encounter.    Patient Instructions  Medication Instructions:  Your physician recommends that you continue on your current medications as directed. Please refer to the Current Medication list given to you today.  *If you need a refill on your cardiac medications before your next appointment, please call your pharmacy*  Lab Work: Your physician recommends that you return for lab work TODAY:  CBC  If you have labs (blood work) drawn today and your tests are completely normal, you will receive your results only by: [place] (if you have MyChart) OR A paper copy in the mail If you have any lab  test that is abnormal or we need to change your treatment, we will call you to review the results.  Testing/Procedures: NONE ordered at this time of appointment   Follow-Up: At [facility], you and your health needs are our priority.  As part of our continuing mission to provide you with exceptional heart care, we have created designated Provider Care Teams.  These Care Teams include your primary Cardiologist (physician) and Advanced Practice Providers (APPs -  Physician Assistants and Nurse Practitioners) who all work together to provide you with the care you need, when you need it.   Your next appointment:   6 month(s)  Provider:   Sanda Klein, MD     Other Instructions    Signed, Sanda Klein, MD  09/11/2022 6:01 PM    [place]

## 2022-08-22 NOTE — Patient Instructions (Signed)
Medication Instructions:  Your physician recommends that you continue on your current medications as directed. Please refer to the Current Medication list given to you today.  *If you need a refill on your cardiac medications before your next appointment, please call your pharmacy*  Lab Work: Your physician recommends that you return for lab work TODAY:  CBC  If you have labs (blood work) drawn today and your tests are completely normal, you will receive your results only by: MyChart Message (if you have MyChart) OR A paper copy in the mail If you have any lab test that is abnormal or we need to change your treatment, we will call you to review the results.  Testing/Procedures: NONE ordered at this time of appointment   Follow-Up: At [facility], you and your health needs are our priority.  As part of our continuing mission to provide you with exceptional heart care, we have created designated Provider Care Teams.  These Care Teams include your primary Cardiologist (physician) and Advanced Practice Providers (APPs -  Physician Assistants and Nurse Practitioners) who all work together to provide you with the care you need, when you need it.   Your next appointment:   6 month(s)  Provider:   Sanda Klein, MD     Other Instructions

## 2022-08-22 NOTE — Progress Notes (Signed)
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 Echo 11/20/2018- EF 55-60%, mild RAE Walk Test 12/21/19- room air- lowest O2 sat 97%, max HR 126. NPSG 08/15/2017- AHI 0/ hr. 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 - Anoro Body weight today-176 lbs Covid vax- 4 Phizers   Flu vax-had ------Patient having shortness of breath with exertion more than normal. Patient also had hip replaced so is working on being more active.  THR in February. Slowly increasing activity but interested in pulmonary rehab. Somedry cough. No acute event, chest pain or phlegm. Chronic AFib- contemplating Watchman procedure. Continues Xarelto CXR 10/18/21- FINDINGS: Heart size upper normal. Negative for heart failure or edema. Atherosclerotic aorta. Lungs clear without infiltrate or effusion. IMPRESSION: No active cardiopulmonary disease.  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 - Anoro Body weight today- Covid vax- 4 Phizers   Flu vax-Had Hosp 12/15-12/17/23- Pneumonia/ sepsis >Vantin, Zith. Covid infection early January > Paxlovid -----Pt had pneumonia in December but states she is much better now  Probably at baseline now with breathing improved after pneumonia.  Minimal cough and little wheeze now.  Continues an oral inhaler.  She asked about pneumonia vaccine and we discussed timing.   ROS-see HPI   + = positive Constitutional:    weight loss+ last year, night sweats, fevers, chills, fatigue, lassitude. HEENT:    headaches, difficulty swallowing, tooth/dental problems, sore throat,       sneezing, itching, ear ache, nasal congestion, post nasal drip, snoring CV:    chest pain, orthopnea, PND, +swelling in lower extremities, anasarca,                                   dizziness, +palpitations Resp:   +shortness of breath with exertion or at rest.                productive cough,   +non-productive cough, coughing up of blood.              change in color of mucus.  wheezing.   Skin:    rash or lesions. GI:  No-   heartburn, indigestion, abdominal pain, nausea, vomiting, diarrhea,                 change in bowel habits, lo+ss of appetite GU: dysuria, change in color of urine, no urgency or frequency.   flank pain. MS:   joint pain, stiffness, decreased range of motion, back pain.  Neuro-     nothing unusual Psych:  change in mood or affect.  depression or anxiety.   memory loss.  OBJ- Physical Exam General- Alert, Oriented, Affect-appropriate, Distress- none acute, obese+,  Skin- rash-none, lesions- none, excoriation- none Lymphadenopathy- none Head- atraumatic            Eyes- Gross vision intact, PERRLA, conjunctivae and secretions clear            Ears- Hearing, canals-normal            Nose- Clear, no-Septal dev, mucus, polyps, erosion, perforation              Throat- Mallampati II , mucosa clear , drainage- none, tonsils- atrophic Neck- flexible , trachea midline, no stridor , thyroid nl, carotid no bruit Chest - symmetrical excursion , unlabored           Heart/CV- IRR/AFib+ /Slow- almost regular, no murmur , no gallop  , no rub, nl s1 s2                           - JVD- none , edema- none, stasis changes+, varices-+ superficial           Lung- clear to P&A/ no rales, wheeze- none, cough- none , dullness-none, rub- none           Chest wall-  Abd-  Br/ Gen/ Rectal- Not done, not indicated Extrem-      +rolling walker  Neuro- grossly intact to observation

## 2022-08-23 ENCOUNTER — Encounter: Payer: Self-pay | Admitting: Internal Medicine

## 2022-08-23 ENCOUNTER — Ambulatory Visit (INDEPENDENT_AMBULATORY_CARE_PROVIDER_SITE_OTHER): Payer: PPO | Admitting: Internal Medicine

## 2022-08-23 VITALS — BP 136/82 | HR 101 | Ht 62.0 in | Wt 184.2 lb

## 2022-08-23 DIAGNOSIS — I5032 Chronic diastolic (congestive) heart failure: Secondary | ICD-10-CM | POA: Diagnosis not present

## 2022-08-23 DIAGNOSIS — J449 Chronic obstructive pulmonary disease, unspecified: Secondary | ICD-10-CM | POA: Diagnosis not present

## 2022-08-23 LAB — CBC
Hematocrit: 35.1 % (ref 34.0–46.6)
Hemoglobin: 11.2 g/dL (ref 11.1–15.9)
MCH: 29.2 pg (ref 26.6–33.0)
MCHC: 31.9 g/dL (ref 31.5–35.7)
MCV: 91 fL (ref 79–97)
Platelets: 246 10*3/uL (ref 150–450)
RBC: 3.84 x10E6/uL (ref 3.77–5.28)
RDW: 15.9 % — ABNORMAL HIGH (ref 11.7–15.4)
WBC: 7.4 10*3/uL (ref 3.4–10.8)

## 2022-08-23 NOTE — Patient Instructions (Addendum)
Order- Prevnar 20  pneumonia vaccine  Ok to continue Anoro  Please call if we can help

## 2022-08-29 ENCOUNTER — Telehealth: Payer: Self-pay | Admitting: Physician Assistant

## 2022-08-29 ENCOUNTER — Emergency Department (HOSPITAL_BASED_OUTPATIENT_CLINIC_OR_DEPARTMENT_OTHER): Payer: PPO | Admitting: Radiology

## 2022-08-29 ENCOUNTER — Encounter (HOSPITAL_BASED_OUTPATIENT_CLINIC_OR_DEPARTMENT_OTHER): Payer: Self-pay

## 2022-08-29 ENCOUNTER — Emergency Department (HOSPITAL_BASED_OUTPATIENT_CLINIC_OR_DEPARTMENT_OTHER)
Admission: EM | Admit: 2022-08-29 | Discharge: 2022-08-30 | Disposition: A | Discharge status: Home / Self Care | Payer: PPO | Attending: Emergency Medicine | Admitting: Emergency Medicine

## 2022-08-29 ENCOUNTER — Other Ambulatory Visit: Payer: Self-pay

## 2022-08-29 DIAGNOSIS — Z7901 Long term (current) use of anticoagulants: Secondary | ICD-10-CM | POA: Diagnosis not present

## 2022-08-29 DIAGNOSIS — I509 Heart failure, unspecified: Secondary | ICD-10-CM | POA: Diagnosis not present

## 2022-08-29 DIAGNOSIS — R001 Bradycardia, unspecified: Secondary | ICD-10-CM

## 2022-08-29 DIAGNOSIS — J449 Chronic obstructive pulmonary disease, unspecified: Secondary | ICD-10-CM | POA: Diagnosis not present

## 2022-08-29 DIAGNOSIS — E119 Type 2 diabetes mellitus without complications: Secondary | ICD-10-CM | POA: Insufficient documentation

## 2022-08-29 DIAGNOSIS — I110 Hypertensive heart disease with heart failure: Secondary | ICD-10-CM | POA: Diagnosis not present

## 2022-08-29 DIAGNOSIS — R519 Headache, unspecified: Secondary | ICD-10-CM | POA: Diagnosis not present

## 2022-08-29 DIAGNOSIS — Z8616 Personal history of COVID-19: Secondary | ICD-10-CM | POA: Diagnosis not present

## 2022-08-29 DIAGNOSIS — I2510 Atherosclerotic heart disease of native coronary artery without angina pectoris: Secondary | ICD-10-CM | POA: Insufficient documentation

## 2022-08-29 LAB — CBC
HCT: 32.6 % — ABNORMAL LOW (ref 36.0–46.0)
Hemoglobin: 11.3 g/dL — ABNORMAL LOW (ref 12.0–15.0)
MCH: 29.7 pg (ref 26.0–34.0)
MCHC: 34.7 g/dL (ref 30.0–36.0)
MCV: 85.8 fL (ref 80.0–100.0)
Platelets: 211 10*3/uL (ref 150–400)
RBC: 3.8 MIL/uL — ABNORMAL LOW (ref 3.87–5.11)
RDW: 14.6 % (ref 11.5–15.5)
WBC: 8.8 10*3/uL (ref 4.0–10.5)
nRBC: 0 % (ref 0.0–0.2)

## 2022-08-29 LAB — BASIC METABOLIC PANEL
Anion gap: 9 (ref 5–15)
BUN: 18 mg/dL (ref 8–23)
CO2: 24 mmol/L (ref 22–32)
Calcium: 9.6 mg/dL (ref 8.9–10.3)
Chloride: 92 mmol/L — ABNORMAL LOW (ref 98–111)
Creatinine, Ser: 1.16 mg/dL — ABNORMAL HIGH (ref 0.44–1.00)
GFR, Estimated: 50 mL/min — ABNORMAL LOW (ref >60)
Glucose, Bld: 210 mg/dL — ABNORMAL HIGH (ref 70–99)
Potassium: 5.4 mmol/L — ABNORMAL HIGH (ref 3.5–5.1)
Sodium: 125 mmol/L — ABNORMAL LOW (ref 135–145)

## 2022-08-29 LAB — MAGNESIUM: Magnesium: 1.2 mg/dL — ABNORMAL LOW (ref 1.7–2.4)

## 2022-08-29 LAB — TSH: TSH: 2.625 u[IU]/mL (ref 0.350–4.500)

## 2022-08-29 LAB — PROTIME-INR
INR: 1.2 (ref 0.8–1.2)
Prothrombin Time: 14.7 seconds (ref 11.4–15.2)

## 2022-08-29 LAB — CBG MONITORING, ED: Glucose-Capillary: 227 mg/dL — ABNORMAL HIGH (ref 70–99)

## 2022-08-29 LAB — TROPONIN I (HIGH SENSITIVITY): Troponin I (High Sensitivity): 4 ng/L (ref <18)

## 2022-08-29 MED ORDER — MAGNESIUM SULFATE 50 % IJ SOLN
2.0000 g | Freq: Once | INTRAMUSCULAR | Status: AC
  Administered 2022-08-29: 2 g via INTRAVENOUS
  Filled 2022-08-29: qty 4

## 2022-08-29 NOTE — Telephone Encounter (Signed)
Patient with Dr. Sallyanne Kuster paged after hour answering service.    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  He had stenting of distal left circumflex branch in October 2009.  She had also had large pericardial effusion on echocardiogram in June 2020 requiring pericardiocentesis for tamponade.  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  More recently, patient has been having issues with weakness, fatigue and dyspnea despite increasing torsemide dosage.  She was also being considered for Watchman device as she was felt to be a poor candidate for long-term anticoagulation therapy.  She was recently admitted a month ago with sepsis from UTI and pneumonia.  EKG at the time showed atrial fibrillation with heart rate of 110s.  She was using her usual state of health this afternoon around 4:30 PM when she began to feel bad.  She had dizziness and weakness.  She noted her heart rate was in the 30s and 40s.  At the time of call back, she has arrived at [facility].  I reassured the patient she is at the right place.  I have also notified the overnight fellow regarding this patient just in case he get called from ED.

## 2022-08-29 NOTE — ED Provider Notes (Signed)
  Provider Note MRN:  [id number]  Arrival date & time: 08/30/22    ED Course and Medical Decision Making  Assumed care from Dr. Laverta Baltimore at shift change.  Some dizziness in the setting of bradycardia at home, follows with Dr. Sallyanne Kuster.  On multiple AV blockers.  Asymptomatic during her ED visit, well-appearing, workup reassuring thus far, anticipating discharge after second troponin.  She will follow-up with cardiology within the next few days.  Procedures  Final Clinical Impressions(s) / ED Diagnoses     ICD-10-CM   1. Bradycardia  R00.1       ED Discharge Orders     None         Discharge Instructions      You were seen in the emerged from today with low heart rates.  Your heart rate is been fairly normal here.  I would have you continue your home medications as prescribed and begin to supplement your magnesium by restarting your supplements at home.  You should have your magnesium and other electrolytes rechecked in the coming week either by your PCP or through your cardiology office.  Please call the cardiology office in the morning to discuss your ED visit and they can advise on any dose changes to your medicines.     Barth Kirks. Sedonia Small, [place] [email or web address]    Maudie Flakes, MD 08/30/22 [phone]

## 2022-08-29 NOTE — Discharge Instructions (Signed)
You were seen in the emerged from today with low heart rates.  Your heart rate is been fairly normal here.  I would have you continue your home medications as prescribed and begin to supplement your magnesium by restarting your supplements at home.  You should have your magnesium and other electrolytes rechecked in the coming week either by your PCP or through your cardiology office.  Please call the cardiology office in the morning to discuss your ED visit and they can advise on any dose changes to your medicines.

## 2022-08-29 NOTE — ED Notes (Signed)
Patient heart rate was 32 while in triage. Patient reports chest tightness rated 6/10.

## 2022-08-29 NOTE — ED Provider Notes (Signed)
Emergency Department Provider Note   I have reviewed the triage vital signs and the nursing notes.   HISTORY  Chief Complaint Bradycardia and Headache   HPI [name] is a 72 y.o. female with a past history of A-fib on Xarelto, CHF, COPD, CAD presents to the emergency department for evaluation of lightheadedness and bradycardia at home.  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  She takes both diltiazem and metoprolol daily and took it this morning as prescribed.  She did not take any additional metoprolol.  No other changes to medications.  She states that she does have some continued congestion and continues to take Mucinex after admission in late December for pneumonia and COVID shortly after that.  She is completed antiviral therapy and overall is feeling improved.  Around 4 PM today she began to feel lightheaded without chest pain or shortness of breath.  She checked her heart rate and found it to be in the 30s and felt lightheaded.  Because of this, she presents to the ED for evaluation.  She notes that she is feeling much better without any change or specific treatment.    Past Medical History:  Diagnosis Date   Anemia, unspecified    Arthritis    Asthma    Atrial fibrillation with RVR ([place])    a. on Xarelto   Bell's palsy    Facial nerve decompression in 2001   CHF (congestive heart failure) (HCC)    Chronic low back pain    COPD with asthma    Coronary artery disease    Myoview 04/12/11 was entirely normal. ECHO 02/26/08 showed only minor abnormalities. Stenting 05/26/08 of her posterolateral branch to the left circumflex coronary artery. Used a 2.5x66m Taxus Monorail stent.myoview 2014 was without ischemia   Diabetes mellitus    Type 2   Early cataracts, bilateral    Fatty liver    GERD (gastroesophageal reflux disease)    Glaucoma    Glaucoma    Goiter    Heart murmur    History  of nuclear stress test 2012; 2014   lexiscan; normal pattern of perfusion; normal, low risk scan    Hyperlipidemia    Hypertension    Panic disorder    Pneumonia 2008   Polycystic ovary disease    Hysterectomy in 1982 for this   Shortness of breath dyspnea    ECHO 02/26/08 showed only minor abnormalities   Spinal stenosis     Review of Systems  Constitutional: No fever/chills Cardiovascular: Denies chest pain. Positive near syncope.  Respiratory: Denies shortness of breath. Gastrointestinal: No abdominal pain.  No nausea, no vomiting.  No diarrhea.  No constipation. Genitourinary: Negative for dysuria. Musculoskeletal: Negative for back pain. Skin: Negative for rash. Neurological: Negative for headaches. ____________________________________________   PHYSICAL EXAM:  VITAL SIGNS: ED Triage Vitals  Enc Vitals Group     BP 08/29/22 2038 (!) 159/55     Pulse Rate 08/29/22 2038 60     Resp 08/29/22 2038 17     Temp 08/29/22 2038 98.1 F (36.7 C)     Temp Source 08/29/22 2038 Oral     SpO2 08/29/22 2038 97 %     Weight 08/29/22 2042 184 lb 4.9 oz (83.6 kg)     Height 08/29/22 2042 '5\' 2"'$  (1.575 m)   Constitutional: Alert and oriented. Well appearing and in no acute distress. Eyes: Conjunctivae are normal.  Head: Atraumatic. Nose: No congestion/rhinnorhea. Mouth/Throat: Mucous membranes are moist.   Neck: No stridor.   Cardiovascular: A fib. Good peripheral circulation. Grossly normal heart sounds.   Respiratory: Normal respiratory effort.  No retractions. Lungs CTAB. Gastrointestinal: Soft and nontender. No distention.  Musculoskeletal: No lower extremity tenderness nor edema. No gross deformities of extremities. Neurologic:  Normal speech and language. No gross focal neurologic deficits are appreciated.  Skin:  Skin is warm, dry and intact. No rash noted.  ____________________________________________   LABS (all labs ordered are listed, but only abnormal results are  displayed)  Labs Reviewed  BASIC METABOLIC PANEL - Abnormal; Notable for the following components:      Result Value   Sodium 125 (*)    Potassium 5.4 (*)    Chloride 92 (*)    Glucose, Bld 210 (*)    Creatinine, Ser 1.16 (*)    GFR, Estimated 50 (*)    All other components within normal limits  CBC - Abnormal; Notable for the following components:   RBC 3.80 (*)    Hemoglobin 11.3 (*)    HCT 32.6 (*)    All other components within normal limits  MAGNESIUM - Abnormal; Notable for the following components:   Magnesium 1.2 (*)    All other components within normal limits  CBG MONITORING, ED - Abnormal; Notable for the following components:   Glucose-Capillary 227 (*)    All other components within normal limits  PROTIME-INR  TSH  TROPONIN I (HIGH SENSITIVITY)  TROPONIN I (HIGH SENSITIVITY)   ____________________________________________  EKG   EKG Interpretation  Date/Time:  Wednesday August 29 2022 20:47:29 EST Ventricular Rate:  53 PR Interval:    QRS Duration: 86 QT Interval:  450 QTC Calculation: 422 R Axis:   -2 Text Interpretation: Atrial fibrillation with slow ventricular response Low voltage QRS Abnormal ECG When compared with ECG of 27-Jul-2022 18:00, PREVIOUS ECG IS PRESENT Confirmed by Nanda Quinton [phone]) on 08/29/2022 8:49:53 PM        ____________________________________________  RADIOLOGY  No results found.  ____________________________________________   PROCEDURES  Procedure(s) performed:   Procedures  None  ____________________________________________   INITIAL IMPRESSION / ASSESSMENT AND PLAN / ED COURSE  Pertinent labs & imaging results that were available during my care of the patient were reviewed by me and considered in my medical decision making (see chart for details).   This patient is Presenting for Evaluation of near syncope , which does require a range of treatment options, and is a complaint that involves a high risk of  morbidity and mortality.  The Differential Diagnoses include bradycardia, PE, ACS, acute CHF, etc.  Critical Interventions-    Medications  magnesium sulfate (IV Push/IM) injection 2 g (2 g Intravenous Given 08/29/22 2309)    Reassessment after intervention: Remains asymptomatic.    I did obtain Additional Historical Information from family at bedside.  I decided to review pertinent External Data, and in summary patient followed by Cardiology. Planning for aggressive rate control with history of tachycardia induced cardiomyopathy.    Clinical Laboratory Tests Ordered, included basic metabolic with mild hyponatremia.  Magnesium 1.2.  TSH pending.   Radiologic Tests Ordered, included CXR. I independently interpreted the images and agree with radiology interpretation.   Cardiac Monitor Tracing which shows NSR.    Social Determinants of Health Risk not an active smoker.   Medical Decision Making: Summary:  Patient presents to the emergency department with appear to be symptomatic bradycardia.  Found to  be hypomagnesemia which may be affecting symptoms.  She is also on aggressive rate control medication.  Currently asymptomatic with heart rate mainly in the 60s to 70s.  Reevaluation with update and discussion with patient.  Plan for continued ED observation and trending troponin. Care transferred to Dr. Sedonia Small.   Considered admission but workup is pending.   Patient's presentation is most consistent with acute presentation with potential threat to life or bodily function.   Disposition: pending   ____________________________________________  FINAL CLINICAL IMPRESSION(S) / ED DIAGNOSES  Final diagnoses:  Bradycardia     Note:  This document was prepared using Dragon voice recognition software and may include unintentional dictation errors.  Nanda Quinton, MD, [facility] Emergency Medicine    Lavada Langsam, [facility], MD 08/30/22 [phone]

## 2022-08-29 NOTE — ED Triage Notes (Addendum)
Patient states when she was at home her pulse was 39-42. Patient has a history of Afib. Patient became dizzy. Patient takes metoprolol and eliquis. Patient reports chest tightness, no pain, with shortness of breath.

## 2022-08-30 LAB — TROPONIN I (HIGH SENSITIVITY): Troponin I (High Sensitivity): 2 ng/L (ref <18)

## 2022-08-31 ENCOUNTER — Encounter: Payer: Self-pay | Admitting: Internal Medicine

## 2022-08-31 ENCOUNTER — Ambulatory Visit (INDEPENDENT_AMBULATORY_CARE_PROVIDER_SITE_OTHER): Payer: PPO | Admitting: Internal Medicine

## 2022-08-31 VITALS — BP 140/80 | HR 125 | Temp 99.2°F | Ht 62.0 in | Wt 183.1 lb

## 2022-08-31 DIAGNOSIS — E871 Hypo-osmolality and hyponatremia: Secondary | ICD-10-CM

## 2022-08-31 DIAGNOSIS — I4811 Longstanding persistent atrial fibrillation: Secondary | ICD-10-CM | POA: Diagnosis not present

## 2022-08-31 DIAGNOSIS — E875 Hyperkalemia: Secondary | ICD-10-CM | POA: Diagnosis not present

## 2022-08-31 LAB — COMPREHENSIVE METABOLIC PANEL
ALT: 14 U/L (ref 0–35)
AST: 33 U/L (ref 0–37)
Albumin: 4.2 g/dL (ref 3.5–5.2)
Alkaline Phosphatase: 54 U/L (ref 39–117)
BUN: 17 mg/dL (ref 6–23)
CO2: 26 mEq/L (ref 19–32)
Calcium: 9.3 mg/dL (ref 8.4–10.5)
Chloride: 96 mEq/L (ref 96–112)
Creatinine, Ser: 0.81 mg/dL (ref 0.40–1.20)
GFR: 71.79 mL/min (ref >60.00)
Glucose, Bld: 184 mg/dL — ABNORMAL HIGH (ref 70–99)
Potassium: 4.9 mEq/L (ref 3.5–5.1)
Sodium: 130 mEq/L — ABNORMAL LOW (ref 135–145)
Total Bilirubin: 0.5 mg/dL (ref 0.2–1.2)
Total Protein: 7.4 g/dL (ref 6.0–8.3)

## 2022-08-31 LAB — MAGNESIUM: Magnesium: 1.3 mg/dL — ABNORMAL LOW (ref 1.5–2.5)

## 2022-08-31 NOTE — Assessment & Plan Note (Signed)
TSH was normal. Checking magnesium and CMP today. Adjust as needed.

## 2022-08-31 NOTE — Assessment & Plan Note (Signed)
Checking CMP. Her K was 5.4 at ER and has had unexplained hyperkalemia in the past.

## 2022-08-31 NOTE — Assessment & Plan Note (Signed)
Checking CMP and Na level was lower at the ER than it had been recently. No new confusion, muscle cramps, numbness.

## 2022-08-31 NOTE — Assessment & Plan Note (Signed)
HR 90-100 during exam and on diltiazem 180 mg daily and metoprolol 100 mg daily and eliquis for NOAC. Also has metoprolol 25 mg to use prn for high HR has not taken recently.

## 2022-08-31 NOTE — Progress Notes (Signed)
   Subjective:   Patient ID: [name], female    DOB: [birth year], 72 y.o.   MRN: [id number]  HPI The patient is a 72 YO female coming in for ER follow up.  Review of Systems  Constitutional:  Positive for fatigue.  HENT: Negative.    Eyes: Negative.   Respiratory:  Negative for cough, chest tightness and shortness of breath.   Cardiovascular:  Negative for chest pain, palpitations and leg swelling.  Gastrointestinal:  Negative for abdominal distention, abdominal pain, constipation, diarrhea, nausea and vomiting.  Musculoskeletal: Negative.   Skin: Negative.   Neurological: Negative.   Psychiatric/Behavioral: Negative.      Objective:  Physical Exam Constitutional:      Appearance: She is well-developed.  HENT:     Head: Normocephalic and atraumatic.  Cardiovascular:     Rate and Rhythm: Normal rate. Rhythm irregular.     Comments: HR 90-100 Pulmonary:     Effort: Pulmonary effort is normal. No respiratory distress.     Breath sounds: Normal breath sounds. No wheezing or rales.  Abdominal:     General: Bowel sounds are normal. There is no distension.     Palpations: Abdomen is soft.     Tenderness: There is no abdominal tenderness. There is no rebound.  Musculoskeletal:     Cervical back: Normal range of motion.  Skin:    General: Skin is warm and dry.  Neurological:     Mental Status: She is alert and oriented to person, place, and time.     Coordination: Coordination normal.     Vitals:   08/31/22 1522 08/31/22 1526  BP: (!) 140/80 (!) 140/80  Pulse: (!) 125   Temp: 99.2 F (37.3 C)   TempSrc: Oral   SpO2: 99%   Weight: 183 lb 2 oz (83.1 kg)   Height: '5\' 2"'$  (1.575 m)     Assessment & Plan:

## 2022-08-31 NOTE — Patient Instructions (Signed)
We will

## 2022-09-14 ENCOUNTER — Encounter: Payer: Self-pay | Admitting: Internal Medicine

## 2022-09-16 ENCOUNTER — Other Ambulatory Visit: Payer: Self-pay | Admitting: Internal Medicine

## 2022-09-17 DIAGNOSIS — Z1283 Encounter for screening for malignant neoplasm of skin: Secondary | ICD-10-CM | POA: Diagnosis not present

## 2022-09-17 DIAGNOSIS — D2239 Melanocytic nevi of other parts of face: Secondary | ICD-10-CM | POA: Diagnosis not present

## 2022-09-17 DIAGNOSIS — L304 Erythema intertrigo: Secondary | ICD-10-CM | POA: Diagnosis not present

## 2022-09-17 DIAGNOSIS — B078 Other viral warts: Secondary | ICD-10-CM | POA: Diagnosis not present

## 2022-09-17 DIAGNOSIS — D485 Neoplasm of uncertain behavior of skin: Secondary | ICD-10-CM | POA: Diagnosis not present

## 2022-09-17 DIAGNOSIS — D2262 Melanocytic nevi of left upper limb, including shoulder: Secondary | ICD-10-CM | POA: Diagnosis not present

## 2022-09-17 DIAGNOSIS — D225 Melanocytic nevi of trunk: Secondary | ICD-10-CM | POA: Diagnosis not present

## 2022-09-20 ENCOUNTER — Encounter (HOSPITAL_COMMUNITY): Payer: Self-pay | Admitting: *Deleted

## 2022-09-21 ENCOUNTER — Other Ambulatory Visit: Payer: Self-pay | Admitting: Internal Medicine

## 2022-09-21 DIAGNOSIS — R051 Acute cough: Secondary | ICD-10-CM

## 2022-10-03 ENCOUNTER — Encounter: Payer: Self-pay | Admitting: Internal Medicine

## 2022-10-03 NOTE — Assessment & Plan Note (Signed)
Near baseline now and benefits from an oral. Plan-Prevnar 20

## 2022-10-03 NOTE — Assessment & Plan Note (Signed)
She feels well-controlled currently.  Ongoing follow-up by cardiology.

## 2022-10-04 ENCOUNTER — Encounter: Payer: Self-pay | Admitting: Physician Assistant

## 2022-10-04 ENCOUNTER — Ambulatory Visit: Payer: PPO | Attending: Physician Assistant | Admitting: Physician Assistant

## 2022-10-04 VITALS — BP 116/62 | HR 90 | Ht 62.0 in | Wt 186.0 lb

## 2022-10-04 DIAGNOSIS — I2510 Atherosclerotic heart disease of native coronary artery without angina pectoris: Secondary | ICD-10-CM | POA: Diagnosis not present

## 2022-10-04 DIAGNOSIS — I10 Essential (primary) hypertension: Secondary | ICD-10-CM | POA: Diagnosis not present

## 2022-10-04 DIAGNOSIS — E785 Hyperlipidemia, unspecified: Secondary | ICD-10-CM | POA: Diagnosis not present

## 2022-10-04 DIAGNOSIS — E119 Type 2 diabetes mellitus without complications: Secondary | ICD-10-CM | POA: Diagnosis not present

## 2022-10-04 DIAGNOSIS — E1169 Type 2 diabetes mellitus with other specified complication: Secondary | ICD-10-CM | POA: Diagnosis not present

## 2022-10-04 DIAGNOSIS — I4821 Permanent atrial fibrillation: Secondary | ICD-10-CM

## 2022-10-04 MED ORDER — LISINOPRIL 10 MG PO TABS: 10.0000 mg | ORAL_TABLET | Freq: Every day | ORAL | 3 refills | Status: DC

## 2022-10-04 MED ORDER — DILTIAZEM HCL ER COATED BEADS 120 MG PO CP24: 120.0000 mg | ORAL_CAPSULE | Freq: Every day | ORAL | 3 refills | Status: DC

## 2022-10-04 NOTE — Patient Instructions (Addendum)
Medication Instructions:   DECREASE Lisinopril to 10 mg daily  DECREASE Cardizem CD 120 mg daily   *If you need a refill on your cardiac medications before your next appointment, please call your pharmacy*  Lab Work: NONE ordered at this time of appointment   If you have labs (blood work) drawn today and your tests are completely normal, you will receive your results only by: [facility] (if you have MyChart) OR A paper copy in the mail If you have any lab test that is abnormal or we need to change your treatment, we will call you to review the results.  Testing/Procedures: NONE ordered at this time of appointment   Follow-Up: At [facility], you and your health needs are our priority.  As part of our continuing mission to provide you with exceptional heart care, we have created designated Provider Care Teams.  These Care Teams include your primary Cardiologist (physician) and Advanced Practice Providers (APPs -  Physician Assistants and Nurse Practitioners) who all work together to provide you with the care you need, when you need it.  Your next appointment:   6 month(s)  Provider:   Sanda Klein, MD     Other Instructions

## 2022-10-04 NOTE — Progress Notes (Signed)
Cardiology Office Note:    Date:  10/06/2022   ID:  [name], DOB Dec 28, 1950, MRN [id number]  PCP:  Hoyt Koch, MD   [place] [facility] Cardiologist:  Sanda Klein, MD     Referring MD: Hoyt Koch, *   Chief Complaint  Patient presents with   Follow-up    Seen for Dr. Sallyanne Kuster    History of Present Illness:    [name] is a 72 y.o. female with a hx of CAD, permanent atrial fibrillation, DM II, HTN, HLD, tamponade and COPD.  She is a former patient of Dr. Melvern Banker.  She is a retired Electrical engineer.  Dr. Melvern Banker stented her distal left circumflex posterolateral branch in October 2009, there was some residual disease in the PDA branch.  EF was normal.  Due to her recurrent chest discomfort, cardiac catheterization was performed in September 2012 revealing a patent stent, 50% distal PDA lesion and normal EF.  Myoview in August 2016 was low risk. Echocardiogram obtained on 07/13/2016 showed EF 60 to 65%, mild MR. Echocardiogram in April 2020 showed EF 55 to 60%, mild biatrial enlargement, no pericardial effusion.  She had repeat echocardiogram 37-monthlater on 01/12/2019 that showed EF 55 to 60%, large pericardial effusion.  She underwent pericardiocentesis for hemothorax with tamponade while on treatment with oral anticoagulation.  Repeat echocardiogram in June 2020 after pericardiocentesis shows no further pericardial effusion.  Echocardiogram in April 2021 shows EF 50 to 55%, moderate biatrial enlargement.  Myoview in October 2022 was low risk without evidence of ischemia or prior infarction.  She was previously hospitalized in 2022 with lower GI bleed, hemoglobin was as low as 6.  Work-up including colonoscopy, EGD and eventually capsule endoscopy did not reveal clear source of bleeding, however there was a couple of AVMs in the colon, one in the cecum that appears to be friable, this was treated with electrocautery.  She has been on blood thinner  since without bleeding issue.   She was seen by Dr. CSallyanne Kusterin October 2023 for worsening weakness and fatigue and dyspnea despite increasing torsemide to 40 mg daily.  She was being considered for Watchman device as she was felt to be a poor candidate for long-term anticoagulation therapy.  Unfortunately lab work obtained on the same day showed her creatinine jumped to 1.44, sodium 128 and a hemoglobin down to 7.9.  She was readmitted to the hospital in early October due to anemia and the GI bleed and Xarelto.  Echocardiogram obtained during this hospitalization showed EF 60 to 65%.  Home diuretic and lisinopril were held, creatinine improved to 0.99 by the time of discharge.  Sodium remained low during hospitalization.  Torsemide resumed at home dose.  Patient received 1 unit of packed red blood cell transfusion and iron transfusion.  GI was consulted who felt that there was no indication for endoscopic intervention given lack of obvious and active bleeding.  GI also recommended patient stay on oral iron supplement along with periodic iron infusion every 6 to 8 weeks and repeat iron panel.  Her Xarelto was switched to Eliquis due to lower GI bleed profile.  I last saw the patient in October 2023 at which time she was doing well.  She was seen by Dr. CBurt Knackin November 2023 for Watchman device.  She was recommended she undergo CT scan of the chest with contrast, however patient was reluctant to proceed due to contrast allergy.  Alternatively, she could undergo transesophageal  echocardiogram to assess the left atrial size.  Patient was admitted in mid December 2023 with shortness of breath and chest x-ray showed left lower lobe pneumonia.  She was admitted to the hospital for few days and received antibiotic.  Urine culture also showed gram-negative rods.  Potassium 6.1 on arrival and the this was reversed with Lokelma.  She was last seen by Dr. Sallyanne Kuster on 08/22/2022, Dr. Sallyanne Kuster reemphasized the benefit of  Watchman procedure as the patient is not considered a long-term anticoagulation candidate due to recurrent GI bleed but may tolerate short-term anticoagulation.  Patient presented to the ED on 08/29/2022 due to dizziness and weakness.  She noted her heart rate was in the 30s and 40s. When I tried to contact her, she was already at [employer].  Significant blood work on arrival included sodium 125, potassium 5.4, creatinine 1.16, magnesium 1.2, CBC 11.3, negative troponin.  TSH normal.  Chest x-ray was normal.  Heart rate was in the 60s to 70s in the ED.  She was given 2 g of magnesium sulfate for hypomagnesemia.  Since discharge, she had repeat blood work on 08/31/2012 which showed sodium has improved to 130, creatinine 0.81, potassium 4.9, magnesium remain low at 1.3.  PCP recommended to take over-the-counter magnesium supplement for a month.  Patient presents today for follow-up.  She denies any further issues since the last ED visit.  She has taken over-the-counter magnesium supplement for over a month.  Overall she has been feeling well.  Her heart rate was 120s when she saw Dr. Sharlet Salina and even today her heart rate is in the 90s.  It turned out the patient did not take her Cardizem 180 mg daily as she thought it was discontinued.  Her blood pressure is 116 mmHg today, I will reduce lisinopril to 10 mg daily and add back Cardizem CD at 120 mg daily for better rate control.  Even though her torsemide is being prescribed as needed, she is essentially taking it on a daily basis at this point.  She does not look volume overloaded on physical exam.  She is still willing to consider Watchman procedure however unwilling to do CT with contrast as she has significant allergic reaction with contrast.  She is willing to consider TEE.  However she does have severe restless leg syndrome and want to make sure this will not interfere with Watchman procedure.  I will send a message to Dr. Burt Knack.    Past  Medical History:  Diagnosis Date   Anemia, unspecified    Arthritis    Asthma    Atrial fibrillation with RVR ([place])    a. on Xarelto   Bell's palsy    Facial nerve decompression in 2001   CHF (congestive heart failure) (HCC)    Chronic low back pain    COPD with asthma    Coronary artery disease    Myoview 04/12/11 was entirely normal. ECHO 02/26/08 showed only minor abnormalities. Stenting 05/26/08 of her posterolateral branch to the left circumflex coronary artery. Used a 2.5x69m Taxus Monorail stent.myoview 2014 was without ischemia   Diabetes mellitus    Type 2   Early cataracts, bilateral    Fatty liver    GERD (gastroesophageal reflux disease)    Glaucoma    Glaucoma    Goiter    Heart murmur    History of nuclear stress test 2012; 2014   lexiscan; normal pattern of perfusion; normal, low risk scan    Hyperlipidemia  Hypertension    Panic disorder    Pneumonia 2008   Polycystic ovary disease    Hysterectomy in 1982 for this   Shortness of breath dyspnea    ECHO 02/26/08 showed only minor abnormalities   Spinal stenosis     Past Surgical History:  Procedure Laterality Date   ABDOMINAL HYSTERECTOMY  1982   & BSO; for polycystic ovary disease   CARDIOVERSION N/A 12/17/2013   Procedure: CARDIOVERSION;  Surgeon: Pixie Casino, MD;  Location: [facility] ENDOSCOPY;  Service: Cardiovascular;  Laterality: N/A;   CARDIOVERSION N/A 09/30/2015   Procedure: CARDIOVERSION;  Surgeon: Dorothy Spark, MD;  Location: [facility] ENDOSCOPY;  Service: Cardiovascular;  Laterality: N/A;   CARDIOVERSION N/A 06/22/2016   Procedure: CARDIOVERSION;  Surgeon: Skeet Latch, MD;  Location: [facility] ENDOSCOPY;  Service: Cardiovascular;  Laterality: N/A;   CENTRAL LINE INSERTION  01/13/2019   Procedure: CENTRAL LINE INSERTION;  Surgeon: Jolaine Artist, MD;  Location: [place] CV LAB;  Service: Cardiovascular;;   COLONOSCOPY     last 2009; Dr Cristina Gong; due 2019   COLONOSCOPY WITH PROPOFOL N/A 01/23/2018    Procedure: COLONOSCOPY WITH PROPOFOL;  Surgeon: Ronald Lobo, MD;  Location: [place];  Service: Endoscopy;  Laterality: N/A;   COLONOSCOPY WITH PROPOFOL N/A 11/04/2020   Procedure: COLONOSCOPY WITH PROPOFOL;  Surgeon: Ronald Lobo, MD;  Location: [place];  Service: Endoscopy;  Laterality: N/A;   CORONARY ANGIOPLASTY  05/26/2008   Stenting of her posterolateral branch to the left circumflex coronary artery. Used a 2.5x78m Taxus Monorail stent.   ESOPHAGOGASTRODUODENOSCOPY (EGD) WITH PROPOFOL N/A 01/23/2018   Procedure: ESOPHAGOGASTRODUODENOSCOPY (EGD) WITH PROPOFOL;  Surgeon: BRonald Lobo MD;  [place]  Service: Endoscopy;  Laterality: N/A;   ESOPHAGOGASTRODUODENOSCOPY (EGD) WITH PROPOFOL N/A 11/04/2020   Procedure: ESOPHAGOGASTRODUODENOSCOPY (EGD) WITH PROPOFOL;  Surgeon: BRonald Lobo MD;  [place]  Service: Endoscopy;  Laterality: N/A;   FACIAL NERVE DECOMPRESSION  2001/2002   bells palsy    GIVENS CAPSULE STUDY N/A 11/04/2020   Procedure: GIVENS CAPSULE STUDY;  Surgeon: BRonald Lobo MD;  [place]  Service: Endoscopy;  Laterality: N/A;   HOT HEMOSTASIS N/A 11/04/2020   Procedure: HOT HEMOSTASIS (ARGON PLASMA COAGULATION/BICAP);  Surgeon: BRonald Lobo MD;  [facility];  Service: Endoscopy;  Laterality: N/A;   LAPAROSCOPIC CHOLECYSTECTOMY  06/15/2011    Dr IDalbert Batman  PERICARDIOCENTESIS N/A 01/13/2019   Procedure: PERICARDIOCENTESIS;  Surgeon: MBurnell Blanks MD;  [place];  Service: Cardiovascular;  Laterality: N/A;   POLYPECTOMY  11/04/2020   Procedure: POLYPECTOMY;  Surgeon: BRonald Lobo MD;  [place]  Service: Endoscopy;;   RIGHT AND LEFT HEART CATH N/A 01/13/2019   Procedure: RIGHT AND LEFT HEART CATH;  Surgeon: BJolaine Artist MD;  [place];  Service: Cardiovascular;  Laterality: N/A;   TEE WITHOUT CARDIOVERSION N/A 12/17/2013   Procedure:  TRANSESOPHAGEAL ECHOCARDIOGRAM (TEE);  Surgeon: KPixie Casino MD;  [facility]  Service: Cardiovascular;  Laterality: N/A;  trish/ja   TOTAL HIP ARTHROPLASTY Left 09/25/2021   Procedure: LEFT TOTAL HIP ARTHROPLASTY ANTERIOR APPROACH;  Surgeon: RFrederik Pear MD;  [facility];  Service: Orthopedics;  Laterality: Left;   TRANSTHORACIC ECHOCARDIOGRAM  [id number]  LV systolic function normal with mild conc LVH; LA mildly dilated; trace MR/TR   UPPER GI ENDOSCOPY  2009   negative    Current Medications: Current Meds  Medication Sig   Accu-Chek Softclix Lancets lancets Use as  instructed (Patient taking differently: 1 each by Other route See admin instructions. Use as instructed)   albuterol (VENTOLIN HFA) 108 (90 Base) MCG/ACT inhaler Inhale 1 puff into the lungs every 6 (six) hours as needed for wheezing or shortness of breath.   apixaban (ELIQUIS) 5 MG TABS tablet Take 1 tablet (5 mg total) by mouth 2 (two) times daily.   ascorbic acid (VITAMIN C) 1000 MG tablet Take 1,000 mg by mouth daily.   benzonatate (TESSALON) 100 MG capsule Take 1 capsule (100 mg total) by mouth 3 (three) times daily as needed for cough.   diltiazem (CARDIZEM CD) 120 MG 24 hr capsule Take 1 capsule (120 mg total) by mouth daily.   famotidine (PEPCID) 20 MG tablet Take 20 mg by mouth 2 (two) times daily.   glucose blood (FREESTYLE LITE) test strip Use as instructed (Patient taking differently: 1 each by Other route See admin instructions. Use as instructed)   guaiFENesin (MUCINEX) 600 MG 12 hr tablet Take 600 mg by mouth 2 (two) times daily.   guaiFENesin-dextromethorphan (ROBITUSSIN DM) 100-10 MG/5ML syrup Take 5 mLs by mouth every 4 (four) hours as needed for cough. Also available OTC   HYDROcodone-acetaminophen (NORCO/VICODIN) 5-325 MG tablet Take 1 tablet by mouth 2 (two) times daily as needed for moderate pain.   latanoprost (XALATAN) 0.005 % ophthalmic solution Place 1 drop into both eyes at bedtime.    LORazepam (ATIVAN) 0.5 MG tablet Take 1 tablet (0.5 mg total) by mouth 2 (two) times daily.   metoprolol succinate (TOPROL-XL) 100 MG 24 hr tablet Take 1 tablet (100 mg total) by mouth 2 (two) times daily.   metoprolol tartrate (LOPRESSOR) 25 MG tablet Take 1 tablet (25 mg total) by mouth daily as needed (if HR increases).   Multiple Minerals-Vitamins (CALCIUM CITRATE PLUS/MAGNESIUM PO) Take 400 mg by mouth daily.   nystatin (MYCOSTATIN/NYSTOP) powder Apply 1 application topically once a week.   nystatin ointment (MYCOSTATIN) Apply 1 Application topically 2 (two) times daily as needed (IRRITATION).   PARoxetine (PAXIL) 10 MG tablet TAKE 2 AND 1/2 TABLETS BY MOUTH ONCE DAILY (Patient taking differently: Take 25 mg by mouth daily.)   rOPINIRole (REQUIP XL) 2 MG 24 hr tablet Take 1 tablet (2 mg total) by mouth at bedtime.   rosuvastatin (CRESTOR) 10 MG tablet TAKE ONE TABLET AT BEDTIME. (Patient taking differently: Take 10 mg by mouth every evening.)   senna (SENOKOT) 8.6 MG TABS Take 1 tablet by mouth at bedtime as needed for mild constipation.   sitaGLIPtin-metformin (JANUMET) 50-1000 MG tablet Take 1 tablet by mouth 2 (two) times daily with a meal.   torsemide (DEMADEX) 20 MG tablet Take 1 tablet (20 mg total) by mouth as needed (swelling and/or weight gain).   traZODone (DESYREL) 100 MG tablet Up to 200 mg at bedtime for sleep (Patient taking differently: Take 100 mg by mouth at bedtime as needed for sleep.)   triamcinolone cream (KENALOG) 0.1 % Apply 1 Application topically 2 (two) times daily. (Patient taking differently: Apply 1 Application topically 2 (two) times daily as needed (Rash).)   umeclidinium-vilanterol (ANORO ELLIPTA) 62.5-25 MCG/ACT AEPB Inhale 1 puff into the lungs daily.   UNABLE TO FIND OUTPATIENT PHYSICAL THERAPY   Diagnosis: Generalized debility, pneumonia, UTI   [DISCONTINUED] lisinopril (ZESTRIL) 20 MG tablet TAKE ONE TABLET BY MOUTH DAILY     Allergies:   Benadryl  [diphenhydramine hcl], Clopidogrel bisulfate, Ioversol, Benadryl [diphenhydramine], and Iodinated contrast media   Social History  Socioeconomic History   Marital status: Married    Spouse name: Not on file   Number of children: 2   Years of education: master's   Highest education level: Not on file  Occupational History   Occupation: Electrical engineer    Employer: [employer]  Tobacco Use   Smoking status: Former    Packs/day: 1.50    Years: 30.00    Total pack years: 45.00    Types: Cigarettes    Quit date: 08/13/1993    Years since quitting: 29.1   Smokeless tobacco: Never  Vaping Use   Vaping Use: Never used  Substance and Sexual Activity   Alcohol use: No   Drug use: No   Sexual activity: Not on file  Other Topics Concern   Not on file  Social History Narrative   ** Merged History Encounter **       Social Determinants of Health   Financial Resource Strain: Not on file  Food Insecurity: No Food Insecurity (07/28/2022)   Hunger Vital Sign    Worried About Running Out of Food in the Last Year: Never true    [place] in the Last Year: Never true  Transportation Needs: No Transportation Needs (07/28/2022)   PRAPARE - Hydrologist (Medical): No    Lack of Transportation (Non-Medical): No  Physical Activity: Not on file  Stress: Not on file  Social Connections: Not on file     Family History: The patient's family history includes Cancer in her brother and sister; Colon cancer (age of onset: 12) in her brother; Colon polyps in her sister; Emphysema (age of onset: 36) in her mother; Gout in her brother; Heart attack (age of onset: 40) in her father; Hyperlipidemia in her brother; Hypertension in her brother; Pneumonia in her maternal grandmother; Ulcers in her mother. There is no history of Diabetes or Stroke.  ROS:   Please see the history of present illness.     All other systems reviewed and are  negative.  EKGs/Labs/Other Studies Reviewed:    The following studies were reviewed today:  Echo 05/20/2022  1. Left ventricular ejection fraction, by estimation, is 60 to 65%. The  left ventricle has normal function. The left ventricle has no regional  wall motion abnormalities. Left ventricular diastolic function could not  be evaluated.   2. Right ventricular systolic function is mildly reduced. The right  ventricular size is normal. Tricuspid regurgitation signal is inadequate  for assessing PA pressure.   3. Left atrial size was mildly dilated.   4. The mitral valve is normal in structure. Trivial mitral valve  regurgitation. No evidence of mitral stenosis.   5. The aortic valve is normal in structure. Aortic valve regurgitation is  mild. No aortic stenosis is present.   6. The inferior vena cava is dilated in size with >50% respiratory  variability, suggesting right atrial pressure of 8 mmHg.    EKG:  EKG is ordered today.  The ekg ordered today demonstrates atrial fibrillation, heart rate 90 bpm.  Recent Labs: 10/18/2021: Pro B Natriuretic peptide (BNP) 108.0 05/18/2022: BNP 102.2 08/29/2022: Hemoglobin 11.3; Platelets 211; TSH 2.625 08/31/2022: ALT 14; BUN 17; Creatinine, Ser 0.81; Magnesium 1.3; Potassium 4.9; Sodium 130  Recent Lipid Panel    Component Value Date/Time   CHOL 101 06/20/2022 1504   CHOL 110 05/18/2022 1623   TRIG 171.0 (H) 06/20/2022 1504   HDL 41.30 06/20/2022 1504   HDL  44 05/18/2022 1623   CHOLHDL 2 06/20/2022 1504   VLDL 34.2 06/20/2022 1504   LDLCALC 25 06/20/2022 1504   LDLCALC 33 05/18/2022 1623   LDLDIRECT 143.4 01/23/2008 1316     Risk Assessment/Calculations:    CHA2DS2-VASc Score = 6   This indicates a 9.7% annual risk of stroke. The patient's score is based upon: CHF History: 1 HTN History: 1 Diabetes History: 1 Stroke History: 0 Vascular Disease History: 1 Age Score: 1 Gender Score: 1          Physical Exam:    VS:  BP  116/62   Pulse 90   Ht '5\' 2"'$  (1.575 m)   Wt 186 lb (84.4 kg)   SpO2 95%   BMI 34.02 kg/m        Wt Readings from Last 3 Encounters:  10/04/22 186 lb (84.4 kg)  08/31/22 183 lb 2 oz (83.1 kg)  08/29/22 184 lb 4.9 oz (83.6 kg)     GEN:  Well nourished, well developed in no acute distress HEENT: Normal NECK: No JVD; No carotid bruits LYMPHATICS: No lymphadenopathy CARDIAC: Irregularly irregular, no murmurs, rubs, gallops RESPIRATORY:  Clear to auscultation without rales, wheezing or rhonchi  ABDOMEN: Soft, non-tender, non-distended MUSCULOSKELETAL:  No edema; No deformity  SKIN: Warm and dry NEUROLOGIC:  Alert and oriented x 3 PSYCHIATRIC:  Normal affect   ASSESSMENT:    1. Permanent atrial fibrillation ([place])   2. Coronary artery disease involving native coronary artery of native heart without angina pectoris   3. Essential hypertension   4. Hyperlipidemia associated with type 2 diabetes mellitus ([place])   5. Controlled type 2 diabetes mellitus without complication, without long-term current use of insulin (HCC)    PLAN:    In order of problems listed above:  Permanent atrial fibrillation: Although heart rate is controlled, her current heart rate is near the upper border normal.  It turned out that the patient has not been taking her Cardizem as she mistakenly thought it was discontinued.  I will restart Cardizem at a lower dose of 120 mg daily.  Will reduce lisinopril to create enough blood pressure room.  She was previously deemed not a great long-term anticoagulation candidate however may benefit from Watchman procedure was short-term anticoagulation.  Unfortunately, patient was unwilling to consider CTA prior to Watchman procedure due to contrast allergy.  She is wanting to consider TEE though.  Will message Dr. Burt Knack.  She is concerned that her restless leg syndrome may interfere with the procedure as well  CAD: Denies any recent chest pain  Hypertension: Blood pressure  stable  Hyperlipidemia: On Crestor  DM2: Managed by primary care provider.           Medication Adjustments/Labs and Tests Ordered: Current medicines are reviewed at length with the patient today.  Concerns regarding medicines are outlined above.  Orders Placed This Encounter  Procedures   EKG 12-Lead   Meds ordered this encounter  Medications   diltiazem (CARDIZEM CD) 120 MG 24 hr capsule    Sig: Take 1 capsule (120 mg total) by mouth daily.    Dispense:  90 capsule    Refill:  3   lisinopril (ZESTRIL) 10 MG tablet    Sig: Take 1 tablet (10 mg total) by mouth daily.    Dispense:  90 tablet    Refill:  3    Patient Instructions  Medication Instructions:   DECREASE Lisinopril to 10 mg daily  DECREASE Cardizem CD 120 mg  daily   *If you need a refill on your cardiac medications before your next appointment, please call your pharmacy*  Lab Work: NONE ordered at this time of appointment   If you have labs (blood work) drawn today and your tests are completely normal, you will receive your results only by: [place] (if you have MyChart) OR A paper copy in the mail If you have any lab test that is abnormal or we need to change your treatment, we will call you to review the results.  Testing/Procedures: NONE ordered at this time of appointment   Follow-Up: At [facility], you and your health needs are our priority.  As part of our continuing mission to provide you with exceptional heart care, we have created designated Provider Care Teams.  These Care Teams include your primary Cardiologist (physician) and Advanced Practice Providers (APPs -  Physician Assistants and Nurse Practitioners) who all work together to provide you with the care you need, when you need it.  Your next appointment:   6 month(s)  Provider:   Sanda Klein, MD     Other Instructions     Signed, Almyra Deforest, [place]  10/06/2022 11:38 PM    [place]

## 2022-10-06 ENCOUNTER — Encounter: Payer: Self-pay | Admitting: Physician Assistant

## 2022-10-09 ENCOUNTER — Ambulatory Visit (INDEPENDENT_AMBULATORY_CARE_PROVIDER_SITE_OTHER): Payer: PPO | Admitting: Internal Medicine

## 2022-10-09 ENCOUNTER — Encounter: Payer: Self-pay | Admitting: Internal Medicine

## 2022-10-09 VITALS — BP 130/80 | HR 101 | Temp 97.9°F | Ht 62.0 in | Wt 186.0 lb

## 2022-10-09 DIAGNOSIS — I509 Heart failure, unspecified: Secondary | ICD-10-CM | POA: Diagnosis not present

## 2022-10-09 DIAGNOSIS — E1169 Type 2 diabetes mellitus with other specified complication: Secondary | ICD-10-CM

## 2022-10-09 DIAGNOSIS — I83813 Varicose veins of bilateral lower extremities with pain: Secondary | ICD-10-CM | POA: Diagnosis not present

## 2022-10-09 DIAGNOSIS — M5416 Radiculopathy, lumbar region: Secondary | ICD-10-CM | POA: Diagnosis not present

## 2022-10-09 DIAGNOSIS — D500 Iron deficiency anemia secondary to blood loss (chronic): Secondary | ICD-10-CM

## 2022-10-09 MED ORDER — FLUTICASONE PROPIONATE 50 MCG/ACT NA SUSP: 2.0000 | Freq: Every day | NASAL | 6 refills | Status: DC

## 2022-10-09 MED ORDER — OZEMPIC (0.25 OR 0.5 MG/DOSE) 2 MG/3ML ~~LOC~~ SOPN: PEN_INJECTOR | SUBCUTANEOUS | 0 refills | Status: AC

## 2022-10-09 NOTE — Progress Notes (Unsigned)
   Subjective:   Patient ID: [name], female    DOB: 05-06-1951, 72 y.o.   MRN: [id number]  HPI The patient is a 72 YO female coming in for varicose veins  Review of Systems  Objective:  Physical Exam  Vitals:   10/09/22 1431  BP: 130/80  Pulse: (!) 101  Temp: 97.9 F (36.6 C)  TempSrc: Oral  SpO2: 93%  Weight: 186 lb (84.4 kg)  Height: 5' 2"$  (1.575 m)    Assessment & Plan:

## 2022-10-09 NOTE — Patient Instructions (Addendum)
We have sent in the flonase.  We have sent in ozempic to take when able. Let us know when you switch we can change janumet to just metformin.

## 2022-10-11 ENCOUNTER — Encounter: Payer: Self-pay | Admitting: Internal Medicine

## 2022-10-11 DIAGNOSIS — I8390 Asymptomatic varicose veins of unspecified lower extremity: Secondary | ICD-10-CM | POA: Insufficient documentation

## 2022-10-11 NOTE — Assessment & Plan Note (Signed)
Counseled that taking eliquis and celebrex together raises risk for bleeding. She does get blood with wiping at least twice a month currently.

## 2022-10-11 NOTE — Assessment & Plan Note (Signed)
Counseled about options and compression stockings. The compression stockings do hurt due to short legs and they bunch up under the knee in a painful way. They decline referral to vein specialist today as she does not know if she wants treatment for this.

## 2022-10-11 NOTE — Assessment & Plan Note (Signed)
She wishes to work on weight and diabetes with ozempic. Rx ozempic first 2 months which she is unable to start now due to living situation but will choose to start in the next few months. Counseled about benefit and risk.

## 2022-10-11 NOTE — Assessment & Plan Note (Signed)
Counseled again about celebrex in the setting of eliquis and prior GI bleed. She does still take this sometimes.

## 2022-10-11 NOTE — Assessment & Plan Note (Signed)
No flare today and legs with minimal swelling. Taking torsemide only as needed. Is on ACE-I.
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
## 2022-10-15 ENCOUNTER — Telehealth: Payer: Self-pay | Admitting: Physician Assistant

## 2022-10-15 NOTE — Telephone Encounter (Signed)
Case discussed with Dr. Burt Knack who felt if the patient is afraid to receive contrast with CTA of the chest prior to Watchman procedure, she will be a candidate to use TEE as an alternative for preoperative workup prior to Watchman procedure.  I have attempted to contact [name] to discuss benefit and risk of TEE procedure, and attempted unsuccessfully twice, will try to reach the patient again tomorrow.

## 2022-10-18 NOTE — Telephone Encounter (Signed)
I have discussed with the patient using TEE as an alternative to CTA prior to Watchman procedure.  We discussed risk and benefit of TEE including 1 in 10,000 risk of sedation causing bradycardia, hypotension and aspiration event or esophageal trauma such as esophageal bleeding, hematoma or esophageal rupture.  She is still interested in Watchman procedure, however wished to look into Watchman procedure by herself for some time and also to save some money.  I have messaged Dr. Antionette Char nurse in structural heart clinic regarding her decision.  We will hold off on scheduling TEE at this time.  She is aware to contact us once she finalize her decision.

## 2022-11-21 ENCOUNTER — Encounter: Payer: Self-pay | Admitting: Internal Medicine

## 2022-11-23 DIAGNOSIS — H401131 Primary open-angle glaucoma, bilateral, mild stage: Secondary | ICD-10-CM | POA: Diagnosis not present

## 2022-11-26 ENCOUNTER — Ambulatory Visit: Payer: HMO | Admitting: Podiatry

## 2022-11-26 ENCOUNTER — Encounter: Payer: Self-pay | Admitting: Podiatry

## 2022-11-26 DIAGNOSIS — M2011 Hallux valgus (acquired), right foot: Secondary | ICD-10-CM | POA: Diagnosis not present

## 2022-11-26 DIAGNOSIS — M2142 Flat foot [pes planus] (acquired), left foot: Secondary | ICD-10-CM

## 2022-11-26 DIAGNOSIS — M2141 Flat foot [pes planus] (acquired), right foot: Secondary | ICD-10-CM

## 2022-11-26 DIAGNOSIS — E1142 Type 2 diabetes mellitus with diabetic polyneuropathy: Secondary | ICD-10-CM | POA: Diagnosis not present

## 2022-11-26 DIAGNOSIS — M2012 Hallux valgus (acquired), left foot: Secondary | ICD-10-CM

## 2022-11-26 DIAGNOSIS — M79674 Pain in right toe(s): Secondary | ICD-10-CM

## 2022-11-26 DIAGNOSIS — M79675 Pain in left toe(s): Secondary | ICD-10-CM

## 2022-11-26 DIAGNOSIS — B351 Tinea unguium: Secondary | ICD-10-CM

## 2022-11-27 DIAGNOSIS — D485 Neoplasm of uncertain behavior of skin: Secondary | ICD-10-CM | POA: Diagnosis not present

## 2022-11-27 DIAGNOSIS — L820 Inflamed seborrheic keratosis: Secondary | ICD-10-CM | POA: Diagnosis not present

## 2022-11-27 DIAGNOSIS — L258 Unspecified contact dermatitis due to other agents: Secondary | ICD-10-CM | POA: Diagnosis not present

## 2022-11-28 ENCOUNTER — Ambulatory Visit: Payer: HMO | Admitting: Neurology

## 2022-11-29 NOTE — Progress Notes (Signed)
ANNUAL DIABETIC FOOT EXAM  Subjective: [name] presents today for annual diabetic foot examination.  Chief Complaint  Patient presents with   Nail Problem    [facility] BS-did not check today A1C-Do not know PCP-[facility] P[facility] VST-09/2022    Patient confirms h/o diabetes.  Patient relates 72 year h/o diabetes.  Patient denies any h/o foot wounds.  Patient has been diagnosed with neuropathy.  Risk factors: diabetes, diabetic neuropathy, h/o DVT, CAD, COPD, CHF, h/o tobacco use in remission.  Myrlene Broker, MD is patient's PCP.  Past Medical History:  Diagnosis Date   Anemia, unspecified    Arthritis    Asthma    Atrial fibrillation with RVR    a. on Xarelto   Bell's palsy    Facial nerve decompression in 2001   CHF (congestive heart failure)    Chronic low back pain    COPD with asthma    Coronary artery disease    Myoview 04/12/11 was entirely normal. ECHO 02/26/08 showed only minor abnormalities. Stenting 05/26/08 of her posterolateral branch to the left circumflex coronary artery. Used a 2.5x3mm Taxus Monorail stent.myoview 2014 was without ischemia   Diabetes mellitus    Type 2   Early cataracts, bilateral    Fatty liver    GERD (gastroesophageal reflux disease)    Glaucoma    Glaucoma    Goiter    Heart murmur    History of nuclear stress test 2012; 2014   lexiscan; normal pattern of perfusion; normal, low risk scan    Hyperlipidemia    Hypertension    Panic disorder    Pneumonia 2008   Polycystic ovary disease    Hysterectomy in 1982 for this   Shortness of breath dyspnea    ECHO 02/26/08 showed only minor abnormalities   Spinal stenosis    Patient Active Problem List   Diagnosis Date Noted   Varicose vein of leg 10/11/2022   Hypomagnesemia 08/31/2022   Hyperkalemia 07/27/2022   Paroxysmal atrial fibrillation    Opioid dependence 04/06/2022   Central obesity 04/03/2022   Morbid obesity 04/03/2022   Spinal stenosis 04/03/2022    Congestive heart failure 11/27/2021   Pincer nail deformity 10/30/2021   H/O total hip arthroplasty, left 09/25/2021   Osteoarthritis of left hip 09/20/2021   Rash 06/23/2021   Longstanding persistent atrial fibrillation 06/15/2021   Closed fracture of left proximal humerus 03/03/2021   Left acetabular fracture 03/03/2021   Chronic diastolic CHF (congestive heart failure) 03/03/2021   DM (diabetes mellitus), type 2 03/03/2021   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   Lumbar radiculitis 12/16/2020   Weakness of both lower extremities 09/23/2020   Abnormal feces 09/19/2020   Family history of malignant neoplasm of gastrointestinal tract 09/19/2020   Chronic respiratory failure with hypoxia 09/01/2020   Nocturnal hypoxemia 04/05/2020   Deep vein thrombosis (DVT) of calf muscle vein of left lower extremity 11/02/2019   Obesity hypoventilation syndrome 11/02/2019   Coagulopathy 11/02/2019   Coronary artery disease involving native coronary artery of native heart with angina pectoris 11/02/2019   Bradycardia 01/12/2019   Pericardial effusion 01/12/2019   Diarrhea 01/12/2019   CHF (congestive heart failure) 01/11/2019   Hyponatremia 12/30/2018   Permanent atrial fibrillation 06/10/2018   Arthritis 10/25/2017   COPD mixed type 10/25/2017   Fibrocystic disease of breast 10/25/2017   Gastroesophageal reflux disease 10/25/2017   Glaucoma 10/25/2017   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   Heart murmur 10/25/2017   Insomnia 10/25/2017  Osteoporosis 10/25/2017   RLS (restless legs syndrome) 04/23/2017   Routine general medical examination at a health care facility 11/16/2016   Diabetic polyneuropathy 07/30/2016   Depression 01/04/2016   Obesity (BMI 30.0-34.9) 10/11/2015   CAD S/P percutaneous coronary angioplasty 04/28/2013   Essential hypertension 05/06/2009   LOW BACK PAIN, CHRONIC 02/12/2008   Anemia 08/15/2007   Hyperlipidemia associated with type 2 diabetes  mellitus 01/02/2007   ELEVATION, TRANSAMINASE/LDH LEVELS 01/02/2007   Past Surgical History:  Procedure Laterality Date   ABDOMINAL HYSTERECTOMY  1982   & BSO; for polycystic ovary disease   CARDIOVERSION N/A 12/17/2013   Procedure: CARDIOVERSION;  Surgeon: Chrystie Nose, MD;  Location: [facility] ENDOSCOPY;  Service: Cardiovascular;  Laterality: N/A;   CARDIOVERSION N/A 09/30/2015   Procedure: CARDIOVERSION;  Surgeon: Lars Masson, MD;  Location: [facility] ENDOSCOPY;  Service: Cardiovascular;  Laterality: N/A;   CARDIOVERSION N/A 06/22/2016   Procedure: CARDIOVERSION;  Surgeon: Chilton Si, MD;  Location: [facility] ENDOSCOPY;  Service: Cardiovascular;  Laterality: N/A;   CENTRAL LINE INSERTION  01/13/2019   Procedure: CENTRAL LINE INSERTION;  Surgeon: Dolores Patty, MD;  Location: [facility] INVASIVE CV LAB;  Service: Cardiovascular;;   COLONOSCOPY     last 2009; Dr Matthias Hughs; due 2019   COLONOSCOPY WITH PROPOFOL N/A 01/23/2018   Procedure: COLONOSCOPY WITH PROPOFOL;  Surgeon: Bernette Redbird, MD;  Location: [facility] ENDOSCOPY;  Service: Endoscopy;  Laterality: N/A;   COLONOSCOPY WITH PROPOFOL N/A 11/04/2020   Procedure: COLONOSCOPY WITH PROPOFOL;  Surgeon: Bernette Redbird, MD;  Location: [facility] ENDOSCOPY;  Service: Endoscopy;  Laterality: N/A;   CORONARY ANGIOPLASTY  05/26/2008   Stenting of her posterolateral branch to the left circumflex coronary artery. Used a 2.5x10mm Taxus Monorail stent.   ESOPHAGOGASTRODUODENOSCOPY (EGD) WITH PROPOFOL N/A 01/23/2018   Procedure: ESOPHAGOGASTRODUODENOSCOPY (EGD) WITH PROPOFOL;  Surgeon: Bernette Redbird, MD;  Location: [facility] ENDOSCOPY;  Service: Endoscopy;  Laterality: N/A;   ESOPHAGOGASTRODUODENOSCOPY (EGD) WITH PROPOFOL N/A 11/04/2020   Procedure: ESOPHAGOGASTRODUODENOSCOPY (EGD) WITH PROPOFOL;  Surgeon: Bernette Redbird, MD;  Location: [facility] ENDOSCOPY;  Service: Endoscopy;  Laterality: N/A;   FACIAL NERVE DECOMPRESSION  2001/2002   bells palsy    GIVENS CAPSULE STUDY N/A 11/04/2020    Procedure: GIVENS CAPSULE STUDY;  Surgeon: Bernette Redbird, MD;  Location: [facility] ENDOSCOPY;  Service: Endoscopy;  Laterality: N/A;   HOT HEMOSTASIS N/A 11/04/2020   Procedure: HOT HEMOSTASIS (ARGON PLASMA COAGULATION/BICAP);  Surgeon: Bernette Redbird, MD;  Location: [facility] ENDOSCOPY;  Service: Endoscopy;  Laterality: N/A;   LAPAROSCOPIC CHOLECYSTECTOMY  06/15/2011    Dr Derrell Lolling   PERICARDIOCENTESIS N/A 01/13/2019   Procedure: PERICARDIOCENTESIS;  Surgeon: Kathleene Hazel, MD;  Location: [facility] INVASIVE CV LAB;  Service: Cardiovascular;  Laterality: N/A;   POLYPECTOMY  11/04/2020   Procedure: POLYPECTOMY;  Surgeon: Bernette Redbird, MD;  Location: [facility] ENDOSCOPY;  Service: Endoscopy;;   RIGHT AND LEFT HEART CATH N/A 01/13/2019   Procedure: RIGHT AND LEFT HEART CATH;  Surgeon: Dolores Patty, MD;  Location: [facility] INVASIVE CV LAB;  Service: Cardiovascular;  Laterality: N/A;   TEE WITHOUT CARDIOVERSION N/A 12/17/2013   Procedure: TRANSESOPHAGEAL ECHOCARDIOGRAM (TEE);  Surgeon: Chrystie Nose, MD;  Location: [facility] ENDOSCOPY;  Service: Cardiovascular;  Laterality: N/A;  trish/ja   TOTAL HIP ARTHROPLASTY Left 09/25/2021   Procedure: LEFT TOTAL HIP ARTHROPLASTY ANTERIOR APPROACH;  Surgeon: Gean Birchwood, MD;  Location: [facility] ORS;  Service: Orthopedics;  Laterality: Left;   TRANSTHORACIC ECHOCARDIOGRAM  02/26/2008   LV systolic function normal with mild conc LVH; LA mildly  dilated; trace MR/TR   UPPER GI ENDOSCOPY  2009   negative   Current Outpatient Medications on File Prior to Visit  Medication Sig Dispense Refill   Accu-Chek Softclix Lancets lancets Use as instructed (Patient taking differently: 1 each by Other route See admin instructions. Use as instructed) 100 each 12   albuterol (VENTOLIN HFA) 108 (90 Base) MCG/ACT inhaler Inhale 1 puff into the lungs every 6 (six) hours as needed for wheezing or shortness of breath.     apixaban (ELIQUIS) 5 MG TABS tablet Take 1 tablet (5 mg total) by mouth 2 (two) times  daily. 60 tablet 5   ascorbic acid (VITAMIN C) 1000 MG tablet Take 1,000 mg by mouth daily.     benzonatate (TESSALON) 100 MG capsule Take 1 capsule (100 mg total) by mouth 3 (three) times daily as needed for cough. 60 capsule 0   diltiazem (CARDIZEM CD) 120 MG 24 hr capsule Take 1 capsule (120 mg total) by mouth daily. 90 capsule 3   famotidine (PEPCID) 20 MG tablet Take 20 mg by mouth 2 (two) times daily.     fluticasone (FLONASE) 50 MCG/ACT nasal spray Place 2 sprays into both nostrils daily. 16 g 6   glucose blood (FREESTYLE LITE) test strip Use as instructed (Patient taking differently: 1 each by Other route See admin instructions. Use as instructed) 100 each 12   guaiFENesin (MUCINEX) 600 MG 12 hr tablet Take 600 mg by mouth 2 (two) times daily.     guaiFENesin-dextromethorphan (ROBITUSSIN DM) 100-10 MG/5ML syrup Take 5 mLs by mouth every 4 (four) hours as needed for cough. Also available OTC 118 mL 0   HYDROcodone-acetaminophen (NORCO/VICODIN) 5-325 MG tablet Take 1 tablet by mouth 2 (two) times daily as needed for moderate pain. 60 tablet 0   latanoprost (XALATAN) 0.005 % ophthalmic solution Place 1 drop into both eyes at bedtime.     lisinopril (ZESTRIL) 10 MG tablet Take 1 tablet (10 mg total) by mouth daily. 90 tablet 3   LORazepam (ATIVAN) 0.5 MG tablet Take 1 tablet (0.5 mg total) by mouth 2 (two) times daily. 60 tablet 5   metoprolol succinate (TOPROL-XL) 100 MG 24 hr tablet Take 1 tablet (100 mg total) by mouth 2 (two) times daily. 180 tablet 3   metoprolol tartrate (LOPRESSOR) 25 MG tablet Take 1 tablet (25 mg total) by mouth daily as needed (if HR increases).     Multiple Minerals-Vitamins (CALCIUM CITRATE PLUS/MAGNESIUM PO) Take 400 mg by mouth daily.     nystatin (MYCOSTATIN/NYSTOP) powder Apply 1 application topically once a week. 60 g 5   nystatin ointment (MYCOSTATIN) Apply 1 Application topically 2 (two) times daily as needed (IRRITATION). 100 g 6   PARoxetine (PAXIL) 10 MG  tablet TAKE 2 AND 1/2 TABLETS BY MOUTH ONCE DAILY (Patient taking differently: Take 25 mg by mouth daily.) 225 tablet 3   rOPINIRole (REQUIP XL) 2 MG 24 hr tablet Take 1 tablet (2 mg total) by mouth at bedtime. 90 tablet 3   rosuvastatin (CRESTOR) 10 MG tablet TAKE ONE TABLET AT BEDTIME. (Patient taking differently: Take 10 mg by mouth every evening.) 90 tablet 3   Semaglutide,0.25 or 0.5MG /DOS, (OZEMPIC, 0.25 OR 0.5 MG/DOSE,) 2 MG/3ML SOPN Inject 0.25 mg into the skin once a week for 28 days, THEN 0.5 mg once a week for 28 days. 6 mL 0   senna (SENOKOT) 8.6 MG TABS Take 1 tablet by mouth at bedtime as needed for mild constipation.  sitaGLIPtin-metformin (JANUMET) 50-1000 MG tablet Take 1 tablet by mouth 2 (two) times daily with a meal. 60 tablet 0   torsemide (DEMADEX) 20 MG tablet Take 1 tablet (20 mg total) by mouth as needed (swelling and/or weight gain). 30 tablet 0   traZODone (DESYREL) 100 MG tablet Up to 200 mg at bedtime for sleep (Patient taking differently: Take 100 mg by mouth at bedtime as needed for sleep.) 180 tablet 3   triamcinolone cream (KENALOG) 0.1 % Apply 1 Application topically 2 (two) times daily. (Patient taking differently: Apply 1 Application topically 2 (two) times daily as needed (Rash).) 100 g 0   umeclidinium-vilanterol (ANORO ELLIPTA) 62.5-25 MCG/ACT AEPB Inhale 1 puff into the lungs daily. 60 each 12   UNABLE TO FIND OUTPATIENT PHYSICAL THERAPY   Diagnosis: Generalized debility, pneumonia, UTI 1 Mutually Defined 0   No current facility-administered medications on file prior to visit.    Allergies  Allergen Reactions   Benadryl [Diphenhydramine Hcl] Other (See Comments)    Restless leg   Clopidogrel Bisulfate     Other reaction(s): stomach upset   Ioversol Other (See Comments)    Unknown   Benadryl [Diphenhydramine] Anxiety   Iodinated Contrast Media Palpitations and Rash    Rapid heart rate, hot Other reaction(s): rapid heartbeat  Other reaction(s):  flushing   Social History   Occupational History   Occupation: Audiological scientist: [employer]  Tobacco Use   Smoking status: Former    Packs/day: 1.50    Years: 30.00    Additional pack years: 0.00    Total pack years: 45.00    Types: Cigarettes    Quit date: 08/13/1993    Years since quitting: 29.3   Smokeless tobacco: Never  Vaping Use   Vaping Use: Never used  Substance and Sexual Activity   Alcohol use: No   Drug use: No   Sexual activity: Not on file   Family History  Problem Relation Age of Onset   Heart attack Father 63       2nd MI at 33   Colon cancer Brother 34   Gout Brother    Ulcers Mother    Emphysema Mother 66   Colon polyps Sister    Cancer Sister        Basal cell carcinoma   Pneumonia Maternal Grandmother    Hypertension Brother    Hyperlipidemia Brother    Cancer Brother        Skin   Diabetes Neg Hx    Stroke Neg Hx    Immunization History  Administered Date(s) Administered   Fluad Quad(high Dose 65+) 07/06/2019, 07/18/2020, 06/26/2021, 05/30/2022   Influenza Whole 05/18/2009, 05/12/2010   Influenza, High Dose Seasonal PF 07/30/2016, 04/23/2017, 06/09/2018   Influenza,inj,Quad PF,6+ Mos 08/03/2015   Influenza,inj,quad, With Preservative 05/14/2015   PFIZER Comirnaty(Gray Top)Covid-19 Tri-Sucrose Vaccine 03/07/2021   PFIZER(Purple Top)SARS-COV-2 Vaccination 09/19/2019, 10/14/2019, 07/05/2020   Pneumococcal Conjugate-13 06/09/2018     Review of Systems: Negative except as noted in the HPI.   Objective: There were no vitals filed for this visit.  JANICE BODINE is a pleasant 72 y.o. female in NAD. AAO X 3.  Vascular Examination: CFT <3 seconds b/l LE. Palpable DP pulse(s) b/l LE. Palpable PT pulse(s) b/l LE. Pedal hair sparse. No pain with calf compression b/l. Lower extremity skin temperature gradient within normal limits. No edema noted b/l LE. No ischemia or gangrene noted b/l LE. No cyanosis or clubbing noted  b/l  LE.  Dermatological Examination: Pedal skin is warm and supple b/l LE. No open wounds b/l LE. No interdigital macerations noted b/l LE. Toenails 2-5 left foot and 3-5 right foot elongated, discolored, dystrophic, thickened, and crumbly with subungual debris and tenderness to dorsal palpation. Anonychia noted bilateral great toes and R 2nd toe. Nailbed(s) epithelialized.  No hyperkeratotic nor porokeratotic lesions present on today's visit.  Neurological Examination: Pt has subjective symptoms of neuropathy. Protective sensation intact 5/5 intact bilaterally with 10g monofilament b/l. Vibratory sensation intact b/l.  Musculoskeletal Examination: Muscle strength 5/5 to all lower extremity muscle groups bilaterally. 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  Footwear Assessment: Does the patient wear appropriate shoes? Yes. Does the patient need inserts/orthotics? No.  Lab Results  Component Value Date   HGBA1C 6.8 (H) 06/20/2022   ADA Risk Categorization: Low Risk :  Patient has all of the following: Intact protective sensation No prior foot ulcer  No severe deformity Pedal pulses present  Assessment: 1. Pain due to onychomycosis of toenails of both feet   2. Hallux valgus, acquired, bilateral   3. Pes planus of both feet   4. Diabetic polyneuropathy associated with type 2 diabetes mellitus      Plan: -Consent given for treatment as described below: -Examined patient. -Diabetic foot examination performed today. -Continue diabetic foot care principles: inspect feet daily, monitor glucose as recommended by PCP and/or Endocrinologist, and follow prescribed diet per PCP, Endocrinologist and/or dietician. -Toenails 2-5 left foot and 3-5 right foot debrided in length and girth without iatrogenic bleeding with sterile nail nipper and dremel.  -Patient/POA to call should there be question/concern in the interim. Return in about 3 months (around  02/25/2023).  Freddie Breech, DPM

## 2022-12-03 ENCOUNTER — Other Ambulatory Visit: Payer: Self-pay | Admitting: Internal Medicine

## 2022-12-03 ENCOUNTER — Other Ambulatory Visit: Payer: Self-pay | Admitting: Cardiovascular Disease

## 2022-12-11 ENCOUNTER — Ambulatory Visit: Payer: PPO | Admitting: Internal Medicine

## 2022-12-17 ENCOUNTER — Ambulatory Visit (INDEPENDENT_AMBULATORY_CARE_PROVIDER_SITE_OTHER): Payer: PPO | Admitting: Internal Medicine

## 2022-12-17 ENCOUNTER — Ambulatory Visit (INDEPENDENT_AMBULATORY_CARE_PROVIDER_SITE_OTHER): Payer: PPO

## 2022-12-17 ENCOUNTER — Encounter: Payer: Self-pay | Admitting: Internal Medicine

## 2022-12-17 VITALS — BP 122/82 | HR 93 | Temp 98.2°F | Ht 62.0 in | Wt 183.0 lb

## 2022-12-17 DIAGNOSIS — D500 Iron deficiency anemia secondary to blood loss (chronic): Secondary | ICD-10-CM

## 2022-12-17 DIAGNOSIS — I5032 Chronic diastolic (congestive) heart failure: Secondary | ICD-10-CM | POA: Diagnosis not present

## 2022-12-17 DIAGNOSIS — R0602 Shortness of breath: Secondary | ICD-10-CM | POA: Diagnosis not present

## 2022-12-17 DIAGNOSIS — J449 Chronic obstructive pulmonary disease, unspecified: Secondary | ICD-10-CM

## 2022-12-17 DIAGNOSIS — E1169 Type 2 diabetes mellitus with other specified complication: Secondary | ICD-10-CM

## 2022-12-17 DIAGNOSIS — R0789 Other chest pain: Secondary | ICD-10-CM | POA: Diagnosis not present

## 2022-12-17 DIAGNOSIS — E871 Hypo-osmolality and hyponatremia: Secondary | ICD-10-CM | POA: Diagnosis not present

## 2022-12-17 DIAGNOSIS — F1120 Opioid dependence, uncomplicated: Secondary | ICD-10-CM

## 2022-12-17 LAB — COMPREHENSIVE METABOLIC PANEL
ALT: 13 U/L (ref 0–35)
AST: 24 U/L (ref 0–37)
Albumin: 4.1 g/dL (ref 3.5–5.2)
Alkaline Phosphatase: 56 U/L (ref 39–117)
BUN: 16 mg/dL (ref 6–23)
CO2: 27 mEq/L (ref 19–32)
Calcium: 9.4 mg/dL (ref 8.4–10.5)
Chloride: 90 mEq/L — ABNORMAL LOW (ref 96–112)
Creatinine, Ser: 0.96 mg/dL (ref 0.40–1.20)
GFR: 58.43 mL/min — ABNORMAL LOW (ref >60.00)
Glucose, Bld: 151 mg/dL — ABNORMAL HIGH (ref 70–99)
Potassium: 4.8 mEq/L (ref 3.5–5.1)
Sodium: 124 mEq/L — ABNORMAL LOW (ref 135–145)
Total Bilirubin: 0.4 mg/dL (ref 0.2–1.2)
Total Protein: 7.4 g/dL (ref 6.0–8.3)

## 2022-12-17 LAB — CBC
HCT: 33.5 % — ABNORMAL LOW (ref 36.0–46.0)
Hemoglobin: 11.6 g/dL — ABNORMAL LOW (ref 12.0–15.0)
MCHC: 34.5 g/dL (ref 30.0–36.0)
MCV: 87.7 fl (ref 78.0–100.0)
Platelets: 235 10*3/uL (ref 150.0–400.0)
RBC: 3.82 Mil/uL — ABNORMAL LOW (ref 3.87–5.11)
RDW: 13.6 % (ref 11.5–15.5)
WBC: 7.4 10*3/uL (ref 4.0–10.5)

## 2022-12-17 LAB — MAGNESIUM: Magnesium: 1.5 mg/dL (ref 1.5–2.5)

## 2022-12-17 LAB — FERRITIN: Ferritin: 31.1 ng/mL (ref 10.0–291.0)

## 2022-12-17 LAB — BRAIN NATRIURETIC PEPTIDE: Pro B Natriuretic peptide (BNP): 216 pg/mL — ABNORMAL HIGH (ref 0.0–100.0)

## 2022-12-17 NOTE — Progress Notes (Unsigned)
   Subjective:   Patient ID: [name], female    DOB: 1950-09-20, 72 y.o.   MRN: [id number]  HPI The patient is a 72 YO female coming in for fatigue and some chest congestion. Possible UTI.   Review of Systems  Constitutional:  Positive for appetite change and fatigue.  HENT:  Positive for congestion.   Eyes: Negative.   Respiratory:  Positive for shortness of breath. Negative for cough and chest tightness.   Cardiovascular:  Negative for chest pain, palpitations and leg swelling.  Gastrointestinal:  Negative for abdominal distention, abdominal pain, constipation, diarrhea, nausea and vomiting.  Genitourinary:  Positive for dysuria and urgency.  Musculoskeletal: Negative.   Skin: Negative.   Neurological: Negative.   Psychiatric/Behavioral: Negative.      Objective:  Physical Exam Constitutional:      Appearance: She is well-developed. She is obese.     Comments: Chronically ill appearing  HENT:     Head: Normocephalic and atraumatic.  Cardiovascular:     Rate and Rhythm: Normal rate and regular rhythm.  Pulmonary:     Effort: Pulmonary effort is normal. No respiratory distress.     Breath sounds: Normal breath sounds. No wheezing or rales.  Abdominal:     General: Bowel sounds are normal. There is no distension.     Palpations: Abdomen is soft.     Tenderness: There is no abdominal tenderness. There is no rebound.  Musculoskeletal:        General: Tenderness present.     Cervical back: Normal range of motion.  Skin:    General: Skin is warm and dry.  Neurological:     Mental Status: She is alert and oriented to person, place, and time.     Coordination: Coordination normal.     Vitals:   12/17/22 1447  BP: 122/82  Pulse: 93  Temp: 98.2 F (36.8 C)  TempSrc: Oral  SpO2: 97%  Weight: 183 lb (83 kg)  Height: 5\' 2"  (1.575 m)    Assessment & Plan:

## 2022-12-17 NOTE — Patient Instructions (Signed)
We will check the labs and the chest x-ray.  

## 2022-12-18 LAB — URINALYSIS, ROUTINE W REFLEX MICROSCOPIC
Bilirubin Urine: NEGATIVE
Ketones, ur: NEGATIVE
Nitrite: NEGATIVE
Specific Gravity, Urine: 1.01 (ref 1.000–1.030)
Total Protein, Urine: 30 — AB
Urine Glucose: NEGATIVE
Urobilinogen, UA: 0.2 (ref 0.0–1.0)
pH: 6 (ref 5.0–8.0)

## 2022-12-18 LAB — HEMOGLOBIN A1C: Hgb A1c MFr Bld: 7.3 % — ABNORMAL HIGH (ref 4.6–6.5)

## 2022-12-20 NOTE — Assessment & Plan Note (Signed)
Previously was on hydrocodone 10/325 up to BID. Last fill Jan 2024 and did not request refill today. She is struggling with chronic pain but overall control is good today.

## 2022-12-20 NOTE — Assessment & Plan Note (Signed)
Some complaint of worsening congestion and SOB lately. Checking CXR and treat as appropriate. I do not hear pneumonia on exam. Taking anoro

## 2022-12-20 NOTE — Assessment & Plan Note (Signed)
Checking CXR given complaint of SOB and congestion to rule out CHF flare and pneumonia. Treat as appropriate. Adjust torsemide 20 mg daily prn as needed.

## 2022-12-20 NOTE — Assessment & Plan Note (Signed)
Checking CMP for stability.  

## 2022-12-20 NOTE — Assessment & Plan Note (Signed)
Checking HgA1c and adjust her janumet 50/1000 mg BID as needed. On ACE-I and statin.

## 2022-12-24 ENCOUNTER — Other Ambulatory Visit: Payer: Self-pay | Admitting: Internal Medicine

## 2022-12-24 MED ORDER — HYDROCODONE-ACETAMINOPHEN 5-325 MG PO TABS: 1.0000 | ORAL_TABLET | Freq: Two times a day (BID) | ORAL | 0 refills | Status: DC | PRN

## 2022-12-27 ENCOUNTER — Other Ambulatory Visit: Payer: Self-pay | Admitting: Internal Medicine

## 2023-01-02 ENCOUNTER — Other Ambulatory Visit: Payer: Self-pay | Admitting: Internal Medicine

## 2023-01-03 ENCOUNTER — Telehealth: Payer: Self-pay

## 2023-01-03 NOTE — Telephone Encounter (Signed)
PA initiated via Covermymeds; KEY: [id number]. Awaiting determination.

## 2023-01-10 NOTE — Telephone Encounter (Signed)
Checked status, [facility] has been having issues with CMM not submitting, I have faxed the PA documents from [facility] to [facility] directly just in case

## 2023-01-10 NOTE — Telephone Encounter (Signed)
Noted, thank you

## 2023-01-17 ENCOUNTER — Other Ambulatory Visit: Payer: Self-pay

## 2023-01-17 ENCOUNTER — Telehealth: Payer: Self-pay | Admitting: Internal Medicine

## 2023-01-17 MED ORDER — BENZONATATE 100 MG PO CAPS: 100.0000 mg | ORAL_CAPSULE | Freq: Three times a day (TID) | ORAL | 1 refills | Status: DC | PRN

## 2023-01-17 NOTE — Telephone Encounter (Signed)
Pt called in complaining about a hard time breathing, Pt has acute visit for first avail on 06/11 w Dr. Judeth Horn. Pls call pt to give suggestions for in between time.

## 2023-01-17 NOTE — Telephone Encounter (Signed)
Yes ok to refill tessalon. Thanks.

## 2023-01-17 NOTE — Telephone Encounter (Signed)
If she is concerned it is related to her heart, she should contact her cardiologist for a sick visit as that would require different treatment/management than her COPD would, which is what we follow her for. She can continue the robitussin. We can also send for her to have albuterol solution and neb machine (urgent order) to use up to 4 times a day. Recommend she use this at least twice a day until symptoms improve and continue her Anoro. Further evaluation/treatment recommendations can be made at her acute appt. If symptoms significantly worsen or she is unable to maintain oxygen levels >88-90%, recommend ED evaluation. Thanks.

## 2023-01-17 NOTE — Telephone Encounter (Signed)
Spoke with patient. She complains of SOB with exertion, dry cough, wheezing, fluid in both ears, chest tightness-states this could be related to her heart failure. Symptoms present x1 month  No fever Patient has been using Robitussin  Florentina Addison can you please advise since Dr. Maple Hudson is off Patient has acute apt scheduled with Dr. Judeth Horn on 6/11-first available  Pharmacy [facility]

## 2023-01-17 NOTE — Telephone Encounter (Signed)
Went over recommendations made from [facility]. Patient states she wants to hold off on nebulizer machine for now. She is requesting a refill of tessalon pearls. Are you okay with me sending those?

## 2023-01-17 NOTE — Telephone Encounter (Signed)
Advised Tessalon pearls have been sent to pharmacy. NFN

## 2023-01-21 ENCOUNTER — Telehealth: Payer: PPO | Admitting: Internal Medicine

## 2023-01-21 ENCOUNTER — Ambulatory Visit: Payer: PPO | Admitting: Neurology

## 2023-01-21 ENCOUNTER — Encounter: Payer: Self-pay | Admitting: Neurology

## 2023-01-21 ENCOUNTER — Encounter: Payer: Self-pay | Admitting: *Deleted

## 2023-01-21 VITALS — BP 142/76 | HR 101 | Ht 62.0 in | Wt 183.0 lb

## 2023-01-21 DIAGNOSIS — D649 Anemia, unspecified: Secondary | ICD-10-CM | POA: Diagnosis not present

## 2023-01-21 DIAGNOSIS — G2581 Restless legs syndrome: Secondary | ICD-10-CM | POA: Diagnosis not present

## 2023-01-21 DIAGNOSIS — I4811 Longstanding persistent atrial fibrillation: Secondary | ICD-10-CM

## 2023-01-21 DIAGNOSIS — I509 Heart failure, unspecified: Secondary | ICD-10-CM

## 2023-01-21 MED ORDER — TRAZODONE HCL 100 MG PO TABS: ORAL_TABLET | ORAL | 3 refills | Status: DC

## 2023-01-21 MED ORDER — ROPINIROLE HCL ER 2 MG PO TB24: 2.0000 mg | ORAL_TABLET | Freq: Every day | ORAL | 3 refills | Status: DC

## 2023-01-21 MED ORDER — ROPINIROLE HCL 0.5 MG PO TABS: 0.5000 mg | ORAL_TABLET | Freq: Every day | ORAL | 3 refills | Status: DC

## 2023-01-21 MED ORDER — BENZONATATE 100 MG PO CAPS: 100.0000 mg | ORAL_CAPSULE | Freq: Three times a day (TID) | ORAL | 1 refills | Status: DC | PRN

## 2023-01-21 NOTE — Telephone Encounter (Signed)
I refilled tessalon pearles for the pt  Sent her mychart msg to be aware

## 2023-01-21 NOTE — Telephone Encounter (Signed)
Pt was scheduled for acute visit 01/22/23 with Dr Judeth Horn  This is a Dr Maple Hudson pt Per Dr Maple Hudson okay for pt to be rescheduled with him tomorrow in a held slot  I called the pt to get her appt moved and had to [facility]

## 2023-01-21 NOTE — Progress Notes (Addendum)
Provider:  Melvyn Novas, MD  Primary Care Physician:  Myrlene Broker, MD [address] [place] [address]     Referring Provider: Myrlene Broker, Md [address] [place],  [place] [address]          Chief Complaint according to patient   Patient presents with:      Patient (Initial Visit)     Pt is well, reports RLS has slightly worsen since last visit and keeps her up at night. She also reports memory is stable       HISTORY OF PRESENT ILLNESS:  [name] is a 72 y.o. female patient who is here for revisit 01/21/2023 for  RLS follow up-  atrial fib had a flair up, and she is still relaying on a walker. She has spinal degenerative disease, L5 , S1. Dr Danielle Dess.  Chief concern according to patient :  " I can hardly get comfortable enough to go to sleep".  01-21-2023: Pt is well, reports RLS has slightly worsen since last visit and keeps her up at night. She also reports memory is stable.    Ambulates with walker.         Rv 11-27-2021; [name] is a patient with RLS and  chronic pain, and has advanced from wheelchair  to walker- she is on many, many medications that will cause hypersomnia, fatigue. Naps often. Gabapentin was not working by itself for RLS .  Pramipexole was not effective.  She reports some daytime restless legs onset when in her recliner, and 90% of RLS manifested at night, in bed.  Pt states again that  the prescription for RLS medication at  2 mg ER was denied by insurance. Takes requip 1 mg tab allowed  3 times a day- can we finally allow this patient to go to the XR form?  She has been bothered by RLS, neuropathy and gait disorder.     Rv 06-15-2021: RV with [name], 72 year old female patient with recent hospitalization.  The patient had a fall in July 2022, a months o after last visit me . which fractured right hip and shoulder, went to [facility] and from there to Rehab, but outpatient at home. She feels  her PT pushed her too hard.  She suffered a new injury in PT , she reported ,now presents in a wheelchair is in pain.  She can't sleep flat anymore, she has a new chair, recliner .  She feels bothered by her RLS, stretching , moving causes pain but she cannot suppress the urge. She has calf pain on the left shin pain and her skin looks tight,        01-12-2021: RV : 78 -year-old Caucasian and Native American patient presents for yearly medication management f/u. Overall stable.  She had a severe decline by Christmas- 2021- until March 2022. Pt states that with the RLS the 2 mg ER was denied by insurance. She has continued taking the 0.5 mg tablet and then additional one if needed (was never ordered the 1 mg dose to take between dinner and bedtime). She has noted dizziness and sleepiness that are possible side effects to medications, thankfully has not not fallen. Chronic respiratory failure. CHF. She has a history of pericardial effusion, had anemia- had transfusion, 3-31-2022_ferritin was 10.2 .  H and H was 10.4 g/ 32% - but walking was limited by swelling in feet and legs.  Rectal bleeding. She is very easily SOB and her hips give out- has been limited in her walking ability. Dr Danielle Dess sent her to PT for gait. Vit B 12 is low. GFR is low. Hyponatremia is present.  FSS 51 points, Epworth at 12/ 24 years, 4/ 15 depression scale.  Paxil may contribute to RLS and I would like to change to 10 mg for 7 days, then start Prozac. She will wait until dr Okey Dupre speaks to her.    11-02-2019; [name] is a 72 year old Caucasian patient of Dr. Hillard Danker, MD.  She presents today after a 23-month hiatus.  The last time I saw her she had pericardial effusions, developed headaches dizziness and had fallen with a large hematoma to the right temple.  She states now that she has not  continued to-  fall to  fall out of bed. As she reports that there was a flurry of falls out of bed in June, July and  August 2020.  She has a history of chronic atrial fibrillation.  She has regained fluid weight is abdominal distention.  She has a remote history of smoking over 25 years ago she quit.  She is tachycardic here today as she checked in and her blood pressure was elevated 152/90, pulse rate was 124.  She has been responding well to the treatment of restless legs with medication.  She endorsed the Epworth Sleepiness Scale at 6 out of 24 points which is a good response, she reports a high degree of fatigue. She is trying very day to walk a little more distance.  She will see Dr. Phillips Odor next month, blood work is next week.      03-05-2019,  [name] is a  72 year old caucasian left handed patient. Presenting for urgent Visit.  Interval history for anew concern of confusion, delayed word retrieval following a fall out of bed, head injury.  She had a headache , a little nausea and about a day later she developed headaches, dizziness and a large hematoma on the right temple.    There has been a progression in her heart failure.  Her interval history is quite remarkable patient informed me in a MyChart message that she had been admitted to [facility] on May 31 after developing increasing shortness of breath and extreme weakness.  She was found to have pulmonary edema, atrial fibrillation and pericardial effusion was evaluated for underlying malignancy.  She had almost 2 L of fluid drained from the pericardium and lives she stated.  She was in the hospital for belly a week about the third week after she returned home she had a fall out of bed.  The CT of the chest abdomen and pelvis was performed without contrast documented small amount of ascites primarily in the pelvis bilateral flank edema anasarca with bilateral pleural effusions, ascites and bilateral flank edema.  A previous CT had already described a similar small nodule to the left adrenal gland this has not changed and is likely incidental.  The  previous CT was from 2003.  CT of the chest showed that the heart was mildly enlarged.there was still a small residual pericardial effusion and the mediastinum did not show enlarged lymph nodes there were pleural effusions small left more than right atelectasis in both lower lobes.  No infiltration, no pancreatic abnormality noted normal spleen, normal bowel anatomy.  The studies are from 14 January 2019. The discharge summary dated 16 January 2019 stated that the patient  would be on fluid restriction she was discharged on torsemide, on Cipro, she will be followed for elevated liver function tests-transaminases, and her work-up for anemia will be continued.  At the time there was no neurologic concerns this arose from the fall 3 weeks later.       Review of Systems: Out of a complete 14 system review, the patient complains of only the following symptoms, and all other reviewed systems are negative.:   Social History   Socioeconomic History   Marital status: Married    Spouse name: Not on file   Number of children: 2   Years of education: master's   Highest education level: Not on file  Occupational History   Occupation: Doctor, general practice    Employer: [employer]  Tobacco Use   Smoking status: Former    Packs/day: 1.50    Years: 30.00    Additional pack years: 0.00    Total pack years: 45.00    Types: Cigarettes    Quit date: 08/13/1993    Years since quitting: 29.4   Smokeless tobacco: Never  Vaping Use   Vaping Use: Never used  Substance and Sexual Activity   Alcohol use: No   Drug use: No   Sexual activity: Not on file  Other Topics Concern   Not on file  Social History Narrative   ** Merged History Encounter **       Social Determinants of Health   Financial Resource Strain: Not on file  Food Insecurity: No Food Insecurity (07/28/2022)   Hunger Vital Sign    Worried About Running Out of Food in the Last Year: Never true    Ran Out of Food in the Last Year: Never  true  Transportation Needs: No Transportation Needs (07/28/2022)   PRAPARE - Administrator, Civil Service (Medical): No    Lack of Transportation (Non-Medical): No  Physical Activity: Not on file  Stress: Not on file  Social Connections: Not on file    Family History  Problem Relation Age of Onset   Heart attack Father 23       2nd MI at 35   Colon cancer Brother 79   Gout Brother    Ulcers Mother    Emphysema Mother 78   Colon polyps Sister    Cancer Sister        Basal cell carcinoma   Pneumonia Maternal Grandmother    Hypertension Brother    Hyperlipidemia Brother    Cancer Brother        Skin   Diabetes Neg Hx    Stroke Neg Hx     Past Medical History:  Diagnosis Date   Anemia, unspecified    Arthritis    Asthma    Atrial fibrillation with RVR (HCC)    a. on Xarelto   Bell's palsy    Facial nerve decompression in 2001   CHF (congestive heart failure) (HCC)    Chronic low back pain    COPD with asthma    Coronary artery disease    Myoview 04/12/11 was entirely normal. ECHO 02/26/08 showed only minor abnormalities. Stenting 05/26/08 of her posterolateral branch to the left circumflex coronary artery. Used a 2.5x77mm Taxus Monorail stent.myoview 2014 was without ischemia   Diabetes mellitus    Type 2   Early cataracts, bilateral    Fatty liver    GERD (gastroesophageal reflux disease)    Glaucoma    Glaucoma    Goiter  Heart murmur    History of nuclear stress test 2012; 2014   lexiscan; normal pattern of perfusion; normal, low risk scan    Hyperlipidemia    Hypertension    Panic disorder    Pneumonia 2008   Polycystic ovary disease    Hysterectomy in 1982 for this   Shortness of breath dyspnea    ECHO 02/26/08 showed only minor abnormalities   Spinal stenosis     Past Surgical History:  Procedure Laterality Date   ABDOMINAL HYSTERECTOMY  1982   & BSO; for polycystic ovary disease   CARDIOVERSION N/A 12/17/2013   Procedure:  CARDIOVERSION;  Surgeon: Chrystie Nose, MD;  Location: [facility] ENDOSCOPY;  Service: Cardiovascular;  Laterality: N/A;   CARDIOVERSION N/A 09/30/2015   Procedure: CARDIOVERSION;  Surgeon: Lars Masson, MD;  Location: [facility] ENDOSCOPY;  Service: Cardiovascular;  Laterality: N/A;   CARDIOVERSION N/A 06/22/2016   Procedure: CARDIOVERSION;  Surgeon: Chilton Si, MD;  Location: [facility] ENDOSCOPY;  Service: Cardiovascular;  Laterality: N/A;   CENTRAL LINE INSERTION  01/13/2019   Procedure: CENTRAL LINE INSERTION;  Surgeon: Dolores Patty, MD;  Location: [facility] INVASIVE CV LAB;  Service: Cardiovascular;;   COLONOSCOPY     last 2009; Dr Matthias Hughs; due 2019   COLONOSCOPY WITH PROPOFOL N/A 01/23/2018   Procedure: COLONOSCOPY WITH PROPOFOL;  Surgeon: Bernette Redbird, MD;  Location: [facility] ENDOSCOPY;  Service: Endoscopy;  Laterality: N/A;   COLONOSCOPY WITH PROPOFOL N/A 11/04/2020   Procedure: COLONOSCOPY WITH PROPOFOL;  Surgeon: Bernette Redbird, MD;  Location: [facility] ENDOSCOPY;  Service: Endoscopy;  Laterality: N/A;   CORONARY ANGIOPLASTY  05/26/2008   Stenting of her posterolateral branch to the left circumflex coronary artery. Used a 2.5x88mm Taxus Monorail stent.   ESOPHAGOGASTRODUODENOSCOPY (EGD) WITH PROPOFOL N/A 01/23/2018   Procedure: ESOPHAGOGASTRODUODENOSCOPY (EGD) WITH PROPOFOL;  Surgeon: Bernette Redbird, MD;  Location: [facility] ENDOSCOPY;  Service: Endoscopy;  Laterality: N/A;   ESOPHAGOGASTRODUODENOSCOPY (EGD) WITH PROPOFOL N/A 11/04/2020   Procedure: ESOPHAGOGASTRODUODENOSCOPY (EGD) WITH PROPOFOL;  Surgeon: Bernette Redbird, MD;  Location: [facility] ENDOSCOPY;  Service: Endoscopy;  Laterality: N/A;   FACIAL NERVE DECOMPRESSION  2001/2002   bells palsy    GIVENS CAPSULE STUDY N/A 11/04/2020   Procedure: GIVENS CAPSULE STUDY;  Surgeon: Bernette Redbird, MD;  Location: [facility] ENDOSCOPY;  Service: Endoscopy;  Laterality: N/A;   HOT HEMOSTASIS N/A 11/04/2020   Procedure: HOT HEMOSTASIS (ARGON PLASMA COAGULATION/BICAP);  Surgeon:  Bernette Redbird, MD;  Location: [facility] ENDOSCOPY;  Service: Endoscopy;  Laterality: N/A;   LAPAROSCOPIC CHOLECYSTECTOMY  06/15/2011    Dr Derrell Lolling   PERICARDIOCENTESIS N/A 01/13/2019   Procedure: PERICARDIOCENTESIS;  Surgeon: Kathleene Hazel, MD;  Location: [facility] INVASIVE CV LAB;  Service: Cardiovascular;  Laterality: N/A;   POLYPECTOMY  11/04/2020   Procedure: POLYPECTOMY;  Surgeon: Bernette Redbird, MD;  Location: [facility] ENDOSCOPY;  Service: Endoscopy;;   RIGHT AND LEFT HEART CATH N/A 01/13/2019   Procedure: RIGHT AND LEFT HEART CATH;  Surgeon: Dolores Patty, MD;  Location: [facility] INVASIVE CV LAB;  Service: Cardiovascular;  Laterality: N/A;   TEE WITHOUT CARDIOVERSION N/A 12/17/2013   Procedure: TRANSESOPHAGEAL ECHOCARDIOGRAM (TEE);  Surgeon: Chrystie Nose, MD;  Location: [facility] ENDOSCOPY;  Service: Cardiovascular;  Laterality: N/A;  trish/ja   TOTAL HIP ARTHROPLASTY Left 09/25/2021   Procedure: LEFT TOTAL HIP ARTHROPLASTY ANTERIOR APPROACH;  Surgeon: Gean Birchwood, MD;  Location: [facility] ORS;  Service: Orthopedics;  Laterality: Left;   TRANSTHORACIC ECHOCARDIOGRAM  02/26/2008   LV systolic function normal with mild conc LVH; LA mildly dilated;  trace MR/TR   UPPER GI ENDOSCOPY  2009   negative     Current Outpatient Medications on File Prior to Visit  Medication Sig Dispense Refill   Accu-Chek Softclix Lancets lancets Use as instructed (Patient taking differently: 1 each by Other route See admin instructions. Use as instructed) 100 each 12   albuterol (VENTOLIN HFA) 108 (90 Base) MCG/ACT inhaler Inhale 1 puff into the lungs every 6 (six) hours as needed for wheezing or shortness of breath.     apixaban (ELIQUIS) 5 MG TABS tablet Take 1 tablet (5 mg total) by mouth 2 (two) times daily. 60 tablet 5   ascorbic acid (VITAMIN C) 1000 MG tablet Take 1,000 mg by mouth daily.     benzonatate (TESSALON) 100 MG capsule Take 1 capsule (100 mg total) by mouth 3 (three) times daily as needed for cough. 60 capsule 1    diltiazem (CARDIZEM CD) 120 MG 24 hr capsule Take 1 capsule (120 mg total) by mouth daily. 90 capsule 3   famotidine (PEPCID) 20 MG tablet Take 20 mg by mouth 2 (two) times daily.     fluticasone (FLONASE) 50 MCG/ACT nasal spray Place 2 sprays into both nostrils daily. 16 g 6   glucose blood (FREESTYLE LITE) test strip Use as instructed (Patient taking differently: 1 each by Other route See admin instructions. Use as instructed) 100 each 12   guaiFENesin (MUCINEX) 600 MG 12 hr tablet Take 600 mg by mouth 2 (two) times daily.     guaiFENesin-dextromethorphan (ROBITUSSIN DM) 100-10 MG/5ML syrup Take 5 mLs by mouth every 4 (four) hours as needed for cough. Also available OTC 118 mL 0   HYDROcodone-acetaminophen (NORCO/VICODIN) 5-325 MG tablet Take 1 tablet by mouth 2 (two) times daily as needed for moderate pain. 60 tablet 0   latanoprost (XALATAN) 0.005 % ophthalmic solution Place 1 drop into both eyes at bedtime.     lisinopril (ZESTRIL) 10 MG tablet Take 1 tablet (10 mg total) by mouth daily. 90 tablet 3   LORazepam (ATIVAN) 0.5 MG tablet Take 1 tablet (0.5 mg total) by mouth 2 (two) times daily. 60 tablet 5   metoprolol succinate (TOPROL-XL) 100 MG 24 hr tablet Take 1 tablet (100 mg total) by mouth 2 (two) times daily. 180 tablet 3   metoprolol tartrate (LOPRESSOR) 25 MG tablet Take 1 tablet (25 mg total) by mouth daily as needed (if HR increases).     Multiple Minerals-Vitamins (CALCIUM CITRATE PLUS/MAGNESIUM PO) Take 400 mg by mouth daily.     nystatin (MYCOSTATIN/NYSTOP) powder Apply 1 application topically once a week. 60 g 5   nystatin ointment (MYCOSTATIN) Apply 1 Application topically 2 (two) times daily as needed (IRRITATION). 100 g 6   PARoxetine (PAXIL) 10 MG tablet TAKE 2 AND 1/2 TABLETS BY MOUTH ONCE DAILY (Patient taking differently: Take 25 mg by mouth daily.) 225 tablet 3   rOPINIRole (REQUIP XL) 2 MG 24 hr tablet Take 1 tablet (2 mg total) by mouth at bedtime. 90 tablet 3    rosuvastatin (CRESTOR) 10 MG tablet TAKE ONE TABLET AT BEDTIME. (Patient taking differently: Take 10 mg by mouth every evening.) 90 tablet 3   senna (SENOKOT) 8.6 MG TABS Take 1 tablet by mouth at bedtime as needed for mild constipation.     sitaGLIPtin-metformin (JANUMET) 50-1000 MG tablet Take 1 tablet by mouth 2 (two) times daily with a meal. 60 tablet 0   torsemide (DEMADEX) 20 MG tablet Take 1 tablet (20 mg total)  by mouth as needed (swelling and/or weight gain). 30 tablet 0   traZODone (DESYREL) 100 MG tablet Up to 200 mg at bedtime for sleep (Patient taking differently: Take 100 mg by mouth at bedtime as needed for sleep.) 180 tablet 3   triamcinolone cream (KENALOG) 0.1 % Apply 1 Application topically 2 (two) times daily. (Patient taking differently: Apply 1 Application topically 2 (two) times daily as needed (Rash).) 100 g 0   umeclidinium-vilanterol (ANORO ELLIPTA) 62.5-25 MCG/ACT AEPB Inhale 1 puff into the lungs daily. 60 each 12   UNABLE TO FIND OUTPATIENT PHYSICAL THERAPY   Diagnosis: Generalized debility, pneumonia, UTI 1 Mutually Defined 0   No current facility-administered medications on file prior to visit.    Allergies  Allergen Reactions   Benadryl [Diphenhydramine Hcl] Other (See Comments)    Restless leg   Clopidogrel Bisulfate     Other reaction(s): stomach upset   Ioversol Other (See Comments)    Unknown   Benadryl [Diphenhydramine] Anxiety   Iodinated Contrast Media Palpitations and Rash    Rapid heart rate, hot Other reaction(s): rapid heartbeat  Other reaction(s): flushing     DIAGNOSTIC DATA (LABS, IMAGING, TESTING) - I reviewed patient records, labs, notes, testing and imaging myself where available.  Lab Results  Component Value Date   WBC 7.4 12/17/2022   HGB 11.6 (L) 12/17/2022   HCT 33.5 (L) 12/17/2022   MCV 87.7 12/17/2022   PLT 235.0 12/17/2022      Component Value Date/Time   NA 124 (L) 12/17/2022 1522   NA 128 (L) 05/28/2022 1450   K  4.8 12/17/2022 1522   CL 90 (L) 12/17/2022 1522   CO2 27 12/17/2022 1522   GLUCOSE 151 (H) 12/17/2022 1522   BUN 16 12/17/2022 1522   BUN 26 05/28/2022 1450   CREATININE 0.96 12/17/2022 1522   CREATININE 1.26 (H) 03/14/2020 1617   CALCIUM 9.4 12/17/2022 1522   PROT 7.4 12/17/2022 1522   PROT 7.7 11/17/2019 1534   ALBUMIN 4.1 12/17/2022 1522   ALBUMIN 4.4 11/17/2019 1534   AST 24 12/17/2022 1522   ALT 13 12/17/2022 1522   ALKPHOS 56 12/17/2022 1522   BILITOT 0.4 12/17/2022 1522   BILITOT 0.4 11/17/2019 1534   GFRNONAA 50 (L) 08/29/2022 2055   GFRAA 42 (L) 03/08/2020 1207   Lab Results  Component Value Date   CHOL 101 06/20/2022   HDL 41.30 06/20/2022   LDLCALC 25 06/20/2022   LDLDIRECT 143.4 01/23/2008   TRIG 171.0 (H) 06/20/2022   CHOLHDL 2 06/20/2022   Lab Results  Component Value Date   HGBA1C 7.3 (H) 12/17/2022   Lab Results  Component Value Date   VITAMINB12 1,179 (H) 05/19/2022   Lab Results  Component Value Date   TSH 2.625 08/29/2022    PHYSICAL EXAM:  Today's Vitals   01/21/23 1518  BP: (!) 142/76  Pulse: (!) 101  Weight: 183 lb (83 kg)  Height: 5\' 2"  (1.575 m)   Body mass index is 33.47 kg/m.   Wt Readings from Last 3 Encounters:  01/21/23 183 lb (83 kg)  12/17/22 183 lb (83 kg)  10/09/22 186 lb (84.4 kg)     Ht Readings from Last 3 Encounters:  01/21/23 5\' 2"  (1.575 m)  12/17/22 5\' 2"  (1.575 m)  10/09/22 5\' 2"  (1.575 m)      General:  Physical exam:  General: The patient is awake, alert and appears in acute pain and distress. She injured her shoulder and hip  on the left , fractured both in July 2022 as a result of a fall. Did initially well after hospital . She is in a wheelchair since she re-injured her hip and shoulder during a PT session.  The patient is obese.  Head: Normocephalic, atraumatic. Neck is supple. Mallampati 3  neck circumference:17 ". Nasal airflow intact , Retrognathia is seen.   Cardiovascular:  irregular rate  and rhythm Respiratory: Lungs are clear to auscultation. Minimal excursion.  Skin:  Without evidence of edema, or rash Trunk: BMI is elevated . Neurologic exam : The patient is awake and alert, oriented to place and time.   She appears jaundiced- .  Memory subjective  described as intact.  Attention span & concentration ability appears normal. She feels slowed, not at baseline.      03/05/2019    1:56 PM  Montreal Cognitive Assessment   Visuospatial/ Executive (0/5) 4  Naming (0/3) 3  Attention: Read list of digits (0/2) 2  Attention: Read list of letters (0/1) 1  Attention: Serial 7 subtraction starting at 100 (0/3) 3  Language: Repeat phrase (0/2) 2  Language : Fluency (0/1) 1  Abstraction (0/2) 2  Delayed Recall (0/5) 5  Orientation (0/6) 6  Total 29   MOCA 29/30    Speech is fluent, without dysarthria, mild dysphonia. Normal word finding.  Mood and affect are depressed.   Cranial nerves: Pupils are equal and briskly reactive to light. Funduscopic exam deferred. Extraocular movements in vertical and horizontal planes intact and without nystagmus. Visual fields by finger perimetry are intact.Hearing to finger rub intact. Facial motor strength is symmetric and tongue and uvula move midline.  Shoulder shrug was symmetrical.  Motor exam:  Normal tone, muscle bulk and symmetric strength in all extremities. Sensory: deferred.  Coordination: Finger-to-nose maneuver  without evidence of ataxia, dysmetria or tremor. Gait and station: Patient walks with walker for assistive device.   Deep tendon reflexes: in the upper and lower extremities are symmetric and intact. Babinski maneuver response is downgoing.  2 wk ago  (05/31/21) 3 mo ago  (03/07/21) 3 mo ago  (03/06/21) 3 mo ago  (03/05/21) 3 mo ago  (03/04/21) 3 mo ago  (03/02/21) 3 mo ago  (03/01/21)    WBC 3.4 - 10.8 x10E3/uL 7.1  8.2 R  8.9 R  8.1 R  9.5 R  11.4 High  R  11.4 High  R   RBC 3.77 - 5.28 x10E6/uL 3.61 Low   2.79 Low  R   2.89 Low  R  2.68 Low  R  2.74 Low  R  3.17 Low  R  3.17 Low  R   Hemoglobin 11.1 - 15.9 g/dL 9.0 Low   8.3 Low  R  8.7 Low  R  8.1 Low  R  8.1 Low  R  9.6 Low  R  9.3 Low  R   Hematocrit 34.0 - 46.6 % 29.1 Low   25.0 Low  R  24.7 Low  R  23.4 Low  R  23.9 Low  R  27.1 Low  R  27.8 Low  R   MCV 79 - 97 fL 81  89.6 R  85.5 R  87.3 R  87.2 R  85.5 R  87.7 R   MCH 26.6 - 33.0 pg 24.9 Low   29.7 R  30.1 R  30.2 R  29.6 R  30.3 R  29.3 R   MCHC 31.5 - 35.7 g/dL 16.1 Low  33.2 R  35.2 R  34.6 R  33.9 R  35.4 R  33.5 R   RDW 11.7 - 15.4 % 14.4  13.3 R  12.9 R  12.8 R  13.1 R  12.7 R  12.9 R   Platelets 150 - 450 x10E3/uL 242  234 R  203 R  188 R  177 R  166 R  209 R   nRBC   0.0 R, CM  0.0 R, CM  0.0 R, CM  0.0 R, CM  0.0 R, CM  0.0 R, CM   Resulting Agency  [facility]       Narrative Performed by: Verdell Carmine Performed at:  01 - [facility] [place]         Ref Range & Units 10 d ago  (06/05/21) 2 wk ago  (05/31/21) 3 mo ago  (03/07/21) 3 mo ago  (03/06/21) 3 mo ago  (03/05/21) 3 mo ago  (03/04/21) 3 mo ago  (03/02/21)  Glucose 70 - 99 mg/dL 161 High   97  096 High  CM  235 High  CM  168 High  CM  162 High  CM  153 High  CM   BUN 8 - 27 mg/dL 17  18  10  R  15 R  18 R  14 R  13 R   Creatinine, Ser 0.57 - 1.00 mg/dL 0.45  4.09  8.11 R  9.14 R  0.93 R  0.98 R  1.01 High  R   eGFR >59 mL/min/1.73 61  61        BUN/Creatinine Ratio 12 - 28 17  18         Sodium 134 - 144 mmol/L 131 Low   129 Low   129 Low  R  126 Low  R  123 Low  R  125 Low  R  130 Low  R   Potassium 3.5 - 5.2 mmol/L 4.9  6.0 High Panic  CM  3.6 R  3.9 R  4.1 R  4.3 R  4.5 R   Chloride 96 - 106 mmol/L 97  95 Low   98 R  93 Low  R  91 Low  R  94 Low  R  97 Low  R   CO2 20 - 29 mmol/L 22  22  22  R  22 R  20 Low  R  23 R  24 R   Calcium 8.7 - 10.3 mg/dL 9.2  9.5  8.7 Low  R  8.5 Low  R  8.4 Low  R  8.8 Low  R  9.0 R     ASSESSMENT AND PLAN 72 y.o. year old female   here with:    1) RLS - I will refer her to Dr Danielle Dess for back problems and I increased her medication by 0.5 mg Ropinorol at night.    2) DM 2 neuropathy, and also spinal DDD.   3) chronic atrial fib, recent spells ( 14 days ago) .      01/21/2023    3:21 PM 05/29/2022    3:44 PM 03/05/2019    1:56 PM  Montreal Cognitive Assessment   Visuospatial/ Executive (0/5) 3 4 4   Naming (0/3) 3 3 3   Attention: Read list of digits (0/2) 2 2 2   Attention: Read list of letters (0/1) 1 1 1   Attention: Serial 7 subtraction starting at  100 (0/3) 3 3 3   Language: Repeat phrase (0/2) 2 2 2   Language : Fluency (0/1) 1 1 1   Abstraction (0/2) 2 2 2   Delayed Recall (0/5) 5 5 5   Orientation (0/6) 6 6 6   Total 28 29 29    0........   I plan to follow up either personally or through our NP within 6 months.   I would like to thank Myrlene Broker, MD and Myrlene Broker, Md [address] [place],  [place] [address] for allowing me to meet with and to take care of this pleasant patient.    After spending a total time of  35  minutes face to face and additional time for physical and neurologic examination, review of laboratory studies,  personal review of imaging studies, reports and results of other testing and review of referral information / records as far as provided in visit,   Electronically signed by: Melvyn Novas, MD 01/21/2023 3:42 PM  [facility] Board certified by The [employer] of Sleep Medicine and Diplomate of the [employer] of [employer]. Board certified In Neurology through the ABPN, Fellow of the [employer] of [employer].

## 2023-01-22 ENCOUNTER — Ambulatory Visit: Payer: PPO | Admitting: Pulmonary Disease

## 2023-01-22 ENCOUNTER — Other Ambulatory Visit (HOSPITAL_COMMUNITY): Payer: Self-pay

## 2023-01-22 NOTE — Telephone Encounter (Signed)
Haven't received approval but test claim shows successful

## 2023-01-23 NOTE — Telephone Encounter (Signed)
No questioned, this sent over by accident pt has been informed about her approval

## 2023-01-23 NOTE — Telephone Encounter (Signed)
I don't see any question for me is there one here?

## 2023-01-24 NOTE — Telephone Encounter (Signed)
Pt called and cancelled appt, closing encounter:  Name: [name], [name] MRN: [id number]  Date: 01/22/2023 Status: Can  Time: 10:15 AM Length: 15  Visit Type: ACUTE [193] Copay: $0.00  Provider: Karren Burly, MD Department: [facility] CARE  Referring Provider: Myrlene Broker CSN: [id number]  Notes: Having a hard time breathing  Made On: Canceled: 01/17/2023 8:11 AM 01/21/2023 4:24 PM By: By: Vilinda Flake, RAYISHA A  Cancel Rsn: Feeling Better

## 2023-02-13 ENCOUNTER — Encounter: Payer: Self-pay | Admitting: Cardiovascular Disease

## 2023-02-13 ENCOUNTER — Other Ambulatory Visit: Payer: Self-pay | Admitting: *Deleted

## 2023-02-13 DIAGNOSIS — I4821 Permanent atrial fibrillation: Secondary | ICD-10-CM

## 2023-02-13 MED ORDER — APIXABAN 5 MG PO TABS: 5.0000 mg | ORAL_TABLET | Freq: Two times a day (BID) | ORAL | 5 refills | Status: DC

## 2023-02-13 NOTE — Telephone Encounter (Signed)
Eliquis 5mg  refill request received. Patient is 72 years old, weight-83kg, Crea-0.96 on 12/17/22, Diagnosis-Afib, and last seen by Azalee Course on 10/04/22. Dose is appropriate based on dosing criteria. Will send in refill to requested pharmacy.

## 2023-02-20 ENCOUNTER — Encounter: Payer: Self-pay | Admitting: Podiatry

## 2023-02-20 ENCOUNTER — Ambulatory Visit: Payer: HMO | Admitting: Podiatry

## 2023-02-20 VITALS — BP 121/39

## 2023-02-20 DIAGNOSIS — M79674 Pain in right toe(s): Secondary | ICD-10-CM

## 2023-02-20 DIAGNOSIS — B351 Tinea unguium: Secondary | ICD-10-CM

## 2023-02-20 DIAGNOSIS — E1142 Type 2 diabetes mellitus with diabetic polyneuropathy: Secondary | ICD-10-CM

## 2023-02-20 DIAGNOSIS — M79675 Pain in left toe(s): Secondary | ICD-10-CM

## 2023-02-24 NOTE — Progress Notes (Signed)
  Subjective:  Patient ID: [name], female    DOB: 10-22-1950,  MRN: [id number]  [name] presents to clinic today for at risk foot care with history of diabetic neuropathy and painful, discolored, thick toenails which interfere with daily activities  Chief Complaint  Patient presents with   Nail Problem     Routine foot care    PCP is Myrlene Broker, MD.  Allergies  Allergen Reactions   Benadryl [Diphenhydramine Hcl] Other (See Comments)    Restless leg   Clopidogrel Bisulfate     Other reaction(s): stomach upset   Ioversol Other (See Comments)    Unknown   Benadryl [Diphenhydramine] Anxiety   Iodinated Contrast Media Palpitations and Rash    Rapid heart rate, hot Other reaction(s): rapid heartbeat  Other reaction(s): flushing    Review of Systems: Negative except as noted in the HPI.  Objective: Vitals:   02/20/23 1440  BP: (!) 121/39   GRISSEL PARSHALL is a pleasant 72 y.o. female in NAD. AAO x 3.  Vascular Examination: CFT <3 seconds b/l LE. Palpable DP pulse(s) b/l LE. Palpable PT pulse(s) b/l LE. Pedal hair sparse. No pain with calf compression b/l. Lower extremity skin temperature gradient within normal limits. No edema noted b/l LE. No ischemia or gangrene noted b/l LE. No cyanosis or clubbing noted b/l LE.  Dermatological Examination: Pedal skin is warm and supple b/l LE. No open wounds b/l LE. No interdigital macerations noted b/l LE.   Incurvated nailplate lateral border of R 4th toe.  Nail border hypertrophy minimal. There is tenderness to palpation. Sign(s) of infection: no clinical signs of infection noted on examination today..  Toenails 2-5 left foot and 3-5 right foot elongated, discolored, dystrophic, thickened, and crumbly with subungual debris and tenderness to dorsal palpation.   Anonychia noted bilateral great toes and R 2nd toe. Nailbed(s) epithelialized.  No hyperkeratotic nor porokeratotic lesions present on today's  visit.  Neurological Examination: Pt has subjective symptoms of neuropathy. Protective sensation intact 5/5 intact bilaterally with 10g monofilament b/l. Vibratory sensation intact b/l.  Musculoskeletal Examination: Muscle strength 5/5 to all lower extremity muscle groups bilaterally. 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 Assessment/Plan: 1. Pain due to onychomycosis of toenails of both feet   2. Diabetic polyneuropathy associated with type 2 diabetes mellitus (HCC)     -Consent given for treatment as described below: -Examined patient. -Patient to continue soft, supportive shoe gear daily. -Toenails were debrided in length and girth 2-5 left foot, R 3rd toe, and R 5th toe with sterile nail nippers and dremel without iatrogenic bleeding.  -No invasive procedure(s) performed. Offending nail border debrided and curretaged R 4th toe utilizing sterile nail nipper and currette. Border(s) cleansed with alcohol and triple antibiotic ointment applied. Patient/POA/Caregiver/Facility instructed to apply Neosporin Cream  to R 4th toe once daily for 7 days. If symptoms do not resolve, schedule appointment with Dr. Charlsie Merles for ingrown toenail right 4th toe. Call office if there are any concerns. -Patient/POA to call should there be question/concern in the interim.   Return in about 3 months (around 05/23/2023).  Janice Breech, DPM

## 2023-02-27 ENCOUNTER — Other Ambulatory Visit: Payer: Self-pay | Admitting: Internal Medicine

## 2023-03-11 ENCOUNTER — Ambulatory Visit: Payer: PPO | Admitting: Podiatry

## 2023-03-15 ENCOUNTER — Other Ambulatory Visit: Payer: Self-pay | Admitting: Internal Medicine

## 2023-03-27 ENCOUNTER — Ambulatory Visit: Payer: PPO | Admitting: Internal Medicine

## 2023-03-28 ENCOUNTER — Telehealth: Payer: Self-pay | Admitting: Podiatry

## 2023-03-28 NOTE — Patient Instructions (Addendum)
      Blood work and urine tests were ordered.          Medications changes include :   none      Return for follow up with PCP for routine follow up.

## 2023-03-28 NOTE — Progress Notes (Unsigned)
Subjective:    Patient ID: [name], female    DOB: [birth year], 72 y.o.   MRN: [id number]     HPI Tyhesia is here for follow up of her chronic medical problems.   Soon she will be having surgery-oral surgery to remove all teeth - dentures as transition to implants.  She wanted to make sure she was not anemic or had low iron prior to having this done.  Compression socks - but none work  - dig into leg.  She has chronic edema from congestive heart failure.   Drak urine, freuq urin -concern about a possible infection-would like to get her urine checked.  She does have chronic shortness of breath, but feels it is slightly increased.  Today her leg swelling is not that bad.  She does find increased shortness of breath with activity and it is taking longer for her to recover.    Medications and allergies reviewed with patient and updated if appropriate.  Current Outpatient Medications on File Prior to Visit  Medication Sig Dispense Refill   Accu-Chek Softclix Lancets lancets Use as instructed (Patient taking differently: 1 each by Other route See admin instructions. Use as instructed) 100 each 12   albuterol (VENTOLIN HFA) 108 (90 Base) MCG/ACT inhaler Inhale 1 puff into the lungs every 6 (six) hours as needed for wheezing or shortness of breath.     apixaban (ELIQUIS) 5 MG TABS tablet Take 1 tablet (5 mg total) by mouth 2 (two) times daily. 60 tablet 5   ascorbic acid (VITAMIN C) 1000 MG tablet Take 1,000 mg by mouth daily.     diltiazem (CARDIZEM CD) 120 MG 24 hr capsule Take 1 capsule (120 mg total) by mouth daily. 90 capsule 3   famotidine (PEPCID) 20 MG tablet Take 20 mg by mouth 2 (two) times daily.     fluticasone (FLONASE) 50 MCG/ACT nasal spray Place 2 sprays into both nostrils daily. 16 g 6   glucose blood (FREESTYLE LITE) test strip Use as instructed (Patient taking differently: 1 each by Other route See admin instructions. Use as instructed) 100 each 12    guaiFENesin (MUCINEX) 600 MG 12 hr tablet Take 600 mg by mouth 2 (two) times daily.     HYDROcodone-acetaminophen (NORCO/VICODIN) 5-325 MG tablet Take 1 tablet by mouth 2 (two) times daily as needed for moderate pain. 60 tablet 0   JANUMET 50-1000 MG tablet TAKE 1 TABLET BY MOUTH TWICE DAILY WITH A MEAL 180 tablet 9   latanoprost (XALATAN) 0.005 % ophthalmic solution Place 1 drop into both eyes at bedtime.     lisinopril (ZESTRIL) 10 MG tablet Take 1 tablet (10 mg total) by mouth daily. 90 tablet 3   LORazepam (ATIVAN) 0.5 MG tablet Take 1 tablet (0.5 mg total) by mouth 2 (two) times daily. 60 tablet 1   metoprolol succinate (TOPROL-XL) 100 MG 24 hr tablet Take 1 tablet (100 mg total) by mouth 2 (two) times daily. 180 tablet 3   metoprolol tartrate (LOPRESSOR) 25 MG tablet Take 1 tablet (25 mg total) by mouth daily as needed (if HR increases).     Multiple Minerals-Vitamins (CALCIUM CITRATE PLUS/MAGNESIUM PO) Take 400 mg by mouth daily.     nystatin (MYCOSTATIN/NYSTOP) powder Apply 1 application topically once a week. 60 g 5   nystatin ointment (MYCOSTATIN) Apply 1 Application topically 2 (two) times daily as needed (IRRITATION). 100 g 6   PARoxetine (PAXIL) 10 MG tablet TAKE 2 AND  1/2 TABLETS BY MOUTH ONCE DAILY 225 tablet 3   rOPINIRole (REQUIP XL) 2 MG 24 hr tablet Take 1 tablet (2 mg total) by mouth at bedtime. 90 tablet 3   rOPINIRole (REQUIP) 0.5 MG tablet Take 1 tablet (0.5 mg total) by mouth at bedtime. 90 tablet 3   rosuvastatin (CRESTOR) 10 MG tablet TAKE ONE TABLET AT BEDTIME. (Patient taking differently: Take 10 mg by mouth every evening.) 90 tablet 3   senna (SENOKOT) 8.6 MG TABS Take 1 tablet by mouth at bedtime as needed for mild constipation.     torsemide (DEMADEX) 20 MG tablet Take 1 tablet (20 mg total) by mouth as needed (swelling and/or weight gain). 30 tablet 0   traZODone (DESYREL) 100 MG tablet Up to 200 mg at bedtime for sleep 180 tablet 3   triamcinolone cream (KENALOG)  0.1 % Apply 1 Application topically 2 (two) times daily. (Patient taking differently: Apply 1 Application topically 2 (two) times daily as needed (Rash).) 100 g 0   umeclidinium-vilanterol (ANORO ELLIPTA) 62.5-25 MCG/ACT AEPB Inhale 1 puff into the lungs daily. 60 each 12   No current facility-administered medications on file prior to visit.     Review of Systems  Respiratory:  Positive for cough and shortness of breath (takes 10 minutes to recover). Negative for wheezing.   Cardiovascular:  Positive for palpitations and leg swelling. Negative for chest pain.       Objective:   Vitals:   03/29/23 1608  BP: 128/72  Pulse: 88  Temp: 98.8 F (37.1 C)  SpO2: 94%   BP Readings from Last 3 Encounters:  03/29/23 128/72  02/20/23 (!) 121/39  01/21/23 (!) 142/76   Wt Readings from Last 3 Encounters:  03/29/23 182 lb (82.6 kg)  01/21/23 183 lb (83 kg)  12/17/22 183 lb (83 kg)   Body mass index is 33.29 kg/m.    Physical Exam Constitutional:      General: She is not in acute distress.    Appearance: Normal appearance.  HENT:     Head: Normocephalic and atraumatic.  Eyes:     Conjunctiva/sclera: Conjunctivae normal.  Cardiovascular:     Rate and Rhythm: Normal rate and regular rhythm.     Heart sounds: Normal heart sounds.  Pulmonary:     Effort: Pulmonary effort is normal. No respiratory distress.     Breath sounds: Normal breath sounds. No wheezing.  Musculoskeletal:     Cervical back: Neck supple.     Right lower leg: No edema.     Left lower leg: No edema.  Lymphadenopathy:     Cervical: No cervical adenopathy.  Skin:    General: Skin is warm and dry.     Findings: No rash.  Neurological:     Mental Status: She is alert. Mental status is at baseline.  Psychiatric:        Mood and Affect: Mood normal.        Behavior: Behavior normal.        Lab Results  Component Value Date   WBC 7.4 12/17/2022   HGB 11.6 (L) 12/17/2022   HCT 33.5 (L) 12/17/2022   PLT  235.0 12/17/2022   GLUCOSE 151 (H) 12/17/2022   CHOL 101 06/20/2022   TRIG 171.0 (H) 06/20/2022   HDL 41.30 06/20/2022   LDLDIRECT 143.4 01/23/2008   LDLCALC 25 06/20/2022   ALT 13 12/17/2022   AST 24 12/17/2022   NA 124 (L) 12/17/2022   K 4.8 12/17/2022  CL 90 (L) 12/17/2022   CREATININE 0.96 12/17/2022   BUN 16 12/17/2022   CO2 27 12/17/2022   TSH 2.625 08/29/2022   INR 1.2 08/29/2022   HGBA1C 7.3 (H) 12/17/2022   MICROALBUR 22.0 (H) 06/20/2022     Assessment & Plan:    Anemia, iron deficiency: Chronic Gets intermittent iron infusions On Eliquis 5 mg twice daily-no symptoms consistent with active GI bleed Having oral surgery I will be extensive and wants to make sure she is not anemic or in need of an iron infusion CBC, iron panel  CAD, CHF: Chronic Follows with cardiology She is experiencing increased shortness of breath, but chronic leg swelling is actually very good today ?  CHF related versus chronic respiratory failure with hypoxia which seems more likely We will check chest x-ray She will follow-up with Dr. Edwena Blow CBC to rule out anemia   Chronic respiratory failure with hypoxia, COPD: Chronic Follows with pulmonary and we will schedule follow-up due to increased shortness of breath on chronic shortness of breath Not currently on oxygen Oxygen level when she checks at home does drop with exertion Finding it taking her longer to recover after exertion Will continue to monitor oxygen level and will follow-up with pulmonary Will check chest x-ray  Chronic lower back pain: Chronic Pain currently managed with hydrocodone 5-325 mg - 1 pill twice daily as needed Due for refill She is taking medication as prescribed [place] controlled substance database checked.  Refill sent to pharmacy  Hypertension, atrial fibrillation: Chronic Following with cardiology On Eliquis 5 mg twice daily, diltiazem 120 mg daily, lisinopril 10 mg daily, metoprolol XL 100 mg  twice daily CBC  Urinary frequency: Feels like right urination is increased more than usual Concerned about UTI Check UA, urine culture

## 2023-03-28 NOTE — Telephone Encounter (Signed)
Pt called and her tight 4th toe is red, inflamed and painful and she thinks it is infected and asking if you could call in a antibiotic for her. She will reschedule her appt for the removal of toenail once finsihed the antibiotics

## 2023-03-29 ENCOUNTER — Ambulatory Visit (INDEPENDENT_AMBULATORY_CARE_PROVIDER_SITE_OTHER): Payer: PPO | Admitting: Internal Medicine

## 2023-03-29 ENCOUNTER — Other Ambulatory Visit: Payer: Self-pay | Admitting: Podiatry

## 2023-03-29 ENCOUNTER — Encounter: Payer: Self-pay | Admitting: Internal Medicine

## 2023-03-29 VITALS — BP 128/72 | HR 88 | Temp 98.8°F | Ht 62.0 in | Wt 182.0 lb

## 2023-03-29 DIAGNOSIS — I4811 Longstanding persistent atrial fibrillation: Secondary | ICD-10-CM | POA: Diagnosis not present

## 2023-03-29 DIAGNOSIS — E785 Hyperlipidemia, unspecified: Secondary | ICD-10-CM

## 2023-03-29 DIAGNOSIS — J9611 Chronic respiratory failure with hypoxia: Secondary | ICD-10-CM

## 2023-03-29 DIAGNOSIS — I509 Heart failure, unspecified: Secondary | ICD-10-CM

## 2023-03-29 DIAGNOSIS — I2510 Atherosclerotic heart disease of native coronary artery without angina pectoris: Secondary | ICD-10-CM | POA: Diagnosis not present

## 2023-03-29 DIAGNOSIS — Z9861 Coronary angioplasty status: Secondary | ICD-10-CM

## 2023-03-29 DIAGNOSIS — F32A Depression, unspecified: Secondary | ICD-10-CM | POA: Diagnosis not present

## 2023-03-29 DIAGNOSIS — G2581 Restless legs syndrome: Secondary | ICD-10-CM

## 2023-03-29 DIAGNOSIS — R350 Frequency of micturition: Secondary | ICD-10-CM

## 2023-03-29 DIAGNOSIS — I10 Essential (primary) hypertension: Secondary | ICD-10-CM | POA: Diagnosis not present

## 2023-03-29 DIAGNOSIS — J449 Chronic obstructive pulmonary disease, unspecified: Secondary | ICD-10-CM | POA: Diagnosis not present

## 2023-03-29 DIAGNOSIS — D508 Other iron deficiency anemias: Secondary | ICD-10-CM | POA: Diagnosis not present

## 2023-03-29 DIAGNOSIS — E1169 Type 2 diabetes mellitus with other specified complication: Secondary | ICD-10-CM

## 2023-03-29 MED ORDER — HYDROCODONE-ACETAMINOPHEN 5-325 MG PO TABS: 1.0000 | ORAL_TABLET | Freq: Two times a day (BID) | ORAL | 0 refills | Status: DC | PRN

## 2023-03-29 MED ORDER — DOXYCYCLINE HYCLATE 100 MG PO TABS: 100.0000 mg | ORAL_TABLET | Freq: Two times a day (BID) | ORAL | 0 refills | Status: DC

## 2023-03-29 NOTE — Telephone Encounter (Signed)
Sent in

## 2023-03-29 NOTE — Telephone Encounter (Signed)
Lvm for pt that antibiotic was sent in and to call if any questions.

## 2023-03-31 ENCOUNTER — Other Ambulatory Visit: Payer: Self-pay | Admitting: General Practice

## 2023-04-01 ENCOUNTER — Other Ambulatory Visit (INDEPENDENT_AMBULATORY_CARE_PROVIDER_SITE_OTHER): Payer: PPO

## 2023-04-01 DIAGNOSIS — I4811 Longstanding persistent atrial fibrillation: Secondary | ICD-10-CM | POA: Diagnosis not present

## 2023-04-01 DIAGNOSIS — I10 Essential (primary) hypertension: Secondary | ICD-10-CM

## 2023-04-01 DIAGNOSIS — D508 Other iron deficiency anemias: Secondary | ICD-10-CM | POA: Diagnosis not present

## 2023-04-01 DIAGNOSIS — R350 Frequency of micturition: Secondary | ICD-10-CM | POA: Diagnosis not present

## 2023-04-01 LAB — CBC WITH DIFFERENTIAL/PLATELET
Basophils Absolute: 0.1 10*3/uL (ref 0.0–0.1)
Basophils Relative: 0.9 % (ref 0.0–3.0)
Eosinophils Absolute: 0.2 10*3/uL (ref 0.0–0.7)
Eosinophils Relative: 2.6 % (ref 0.0–5.0)
HCT: 28.3 % — ABNORMAL LOW (ref 36.0–46.0)
Hemoglobin: 9.2 g/dL — ABNORMAL LOW (ref 12.0–15.0)
Lymphocytes Relative: 10.2 % — ABNORMAL LOW (ref 12.0–46.0)
Lymphs Abs: 0.7 10*3/uL (ref 0.7–4.0)
MCHC: 32.4 g/dL (ref 30.0–36.0)
MCV: 84.7 fl (ref 78.0–100.0)
Monocytes Absolute: 0.6 10*3/uL (ref 0.1–1.0)
Monocytes Relative: 8.7 % (ref 3.0–12.0)
Neutro Abs: 5.4 10*3/uL (ref 1.4–7.7)
Neutrophils Relative %: 77.6 % — ABNORMAL HIGH (ref 43.0–77.0)
Platelets: 213 10*3/uL (ref 150.0–400.0)
RBC: 3.34 Mil/uL — ABNORMAL LOW (ref 3.87–5.11)
RDW: 14 % (ref 11.5–15.5)
WBC: 7 10*3/uL (ref 4.0–10.5)

## 2023-04-01 LAB — FERRITIN: Ferritin: 11.9 ng/mL (ref 10.0–291.0)

## 2023-04-02 LAB — COMPREHENSIVE METABOLIC PANEL
ALT: 9 U/L (ref 0–35)
AST: 16 U/L (ref 0–37)
Albumin: 3.9 g/dL (ref 3.5–5.2)
Alkaline Phosphatase: 55 U/L (ref 39–117)
BUN: 14 mg/dL (ref 6–23)
CO2: 24 meq/L (ref 19–32)
Calcium: 8.7 mg/dL (ref 8.4–10.5)
Chloride: 102 meq/L (ref 96–112)
Creatinine, Ser: 0.86 mg/dL (ref 0.40–1.20)
GFR: 66.54 mL/min (ref >60.00)
Glucose, Bld: 156 mg/dL — ABNORMAL HIGH (ref 70–99)
Potassium: 4.7 meq/L (ref 3.5–5.1)
Sodium: 132 meq/L — ABNORMAL LOW (ref 135–145)
Total Bilirubin: 0.5 mg/dL (ref 0.2–1.2)
Total Protein: 7 g/dL (ref 6.0–8.3)

## 2023-04-02 LAB — URINALYSIS, ROUTINE W REFLEX MICROSCOPIC
Bilirubin Urine: NEGATIVE
Hgb urine dipstick: NEGATIVE
Ketones, ur: NEGATIVE
Leukocytes,Ua: NEGATIVE
Nitrite: NEGATIVE
RBC / HPF: NONE SEEN (ref 0–?)
Specific Gravity, Urine: 1.02 (ref 1.000–1.030)
Total Protein, Urine: 100 — AB
Urine Glucose: NEGATIVE
Urobilinogen, UA: 0.2 (ref 0.0–1.0)
pH: 6 (ref 5.0–8.0)

## 2023-04-02 LAB — IBC PANEL
Iron: 42 ug/dL (ref 42–145)
Saturation Ratios: 7.4 % — ABNORMAL LOW (ref 20.0–50.0)
TIBC: 565.6 ug/dL — ABNORMAL HIGH (ref 250.0–450.0)
Transferrin: 404 mg/dL — ABNORMAL HIGH (ref 212.0–360.0)

## 2023-04-03 LAB — URINE CULTURE: Result:: NO GROWTH

## 2023-04-04 ENCOUNTER — Telehealth: Payer: Self-pay | Admitting: Cardiovascular Disease

## 2023-04-04 NOTE — Telephone Encounter (Signed)
Returned call to pt. Pt states she had her labs drawn last Friday that were ordered by Dr. Lawerance Bach. She is going to have major dental surgery in September and she states the "numbers" are worse and wants Dr. Renaye Rakers opinion on the labs and if she should get and iron infusion. Advised pt this message will be forwarded to Dr. Salena Saner and his nurse for recommendation and we would call her back once we know. Pt verbalized understanding.

## 2023-04-04 NOTE — Telephone Encounter (Signed)
[name] had labs completed on 08/19. [name] would like Dr. Royann Shivers to look over the labs result and see if she needs to have an infusion based upon those results. Please advise.

## 2023-04-04 NOTE — Telephone Encounter (Signed)
Yes, I think she should have an iron infusion again. She should stop the Eliquis for 2 days before her dental surgery.

## 2023-04-05 ENCOUNTER — Encounter: Payer: Self-pay | Admitting: Internal Medicine

## 2023-04-05 NOTE — Telephone Encounter (Signed)
I have ordered iron infusion for her they should call her to schedule okay to send patient mychart message to let her know.

## 2023-04-05 NOTE — Addendum Note (Signed)
Addended by: Hillard Danker A on: 04/05/2023 10:25 AM   Modules accepted: Orders

## 2023-04-05 NOTE — Telephone Encounter (Signed)
  Dr Royann Shivers replied: Yes, I think she should have an iron infusion again. She should stop the Eliquis for 2 days before her dental surgery.     Called and spoke with the patient, gave her the information above. She verbalized understanding.  She asked about the specific iron infusion that she should have. She reports that she had one where it is infused over 4 hours; and had another one where she was infused over 3 days (only took about 15 minutes each time). She reports that the one that was spread out over 3 days did much better than the "one-time" infusion did and lasted much longer. I asked her who ordered the iron infusion last time. She reports that her Haydee Monica, MD was the one to order this last time. I then referred her to follow up with her PCP about getting the iron infusion. I will send this message to her PCP and Dr Royann Shivers as will. Asked her to follow up/call her PCP office. The patient states that she will call the office on Monday.

## 2023-04-08 ENCOUNTER — Telehealth: Payer: Self-pay | Admitting: Pharmacy Technician

## 2023-04-08 NOTE — Telephone Encounter (Signed)
Auth Submission: NO AUTH NEEDED Site of care: [facility] Payer: [employer] Medication & CPT/J Code(s) submitted: Feraheme (ferumoxytol) [id number] Route of submission (phone, fax, portal):  Phone # Fax # Auth type: Buy/Bill PB Units/visits requested: X2 Reference number: [id number] Approval from: 04/08/23 to 07/13/23

## 2023-04-10 ENCOUNTER — Other Ambulatory Visit: Payer: Self-pay | Admitting: Internal Medicine

## 2023-04-10 DIAGNOSIS — Z6833 Body mass index (BMI) 33.0-33.9, adult: Secondary | ICD-10-CM | POA: Diagnosis not present

## 2023-04-10 DIAGNOSIS — M5416 Radiculopathy, lumbar region: Secondary | ICD-10-CM | POA: Diagnosis not present

## 2023-04-16 ENCOUNTER — Other Ambulatory Visit: Payer: Self-pay | Admitting: Pharmacy Technician

## 2023-04-17 ENCOUNTER — Ambulatory Visit: Payer: PPO | Attending: Cardiovascular Disease | Admitting: Cardiovascular Disease

## 2023-04-17 ENCOUNTER — Encounter: Payer: Self-pay | Admitting: Cardiovascular Disease

## 2023-04-17 VITALS — BP 124/70 | HR 89 | Ht 62.0 in | Wt 182.0 lb

## 2023-04-17 DIAGNOSIS — I4821 Permanent atrial fibrillation: Secondary | ICD-10-CM

## 2023-04-17 DIAGNOSIS — E785 Hyperlipidemia, unspecified: Secondary | ICD-10-CM

## 2023-04-17 DIAGNOSIS — S0990XA Unspecified injury of head, initial encounter: Secondary | ICD-10-CM

## 2023-04-17 DIAGNOSIS — I10 Essential (primary) hypertension: Secondary | ICD-10-CM | POA: Diagnosis not present

## 2023-04-17 DIAGNOSIS — G44309 Post-traumatic headache, unspecified, not intractable: Secondary | ICD-10-CM | POA: Diagnosis not present

## 2023-04-17 DIAGNOSIS — I2510 Atherosclerotic heart disease of native coronary artery without angina pectoris: Secondary | ICD-10-CM | POA: Diagnosis not present

## 2023-04-17 DIAGNOSIS — E119 Type 2 diabetes mellitus without complications: Secondary | ICD-10-CM

## 2023-04-17 DIAGNOSIS — I5032 Chronic diastolic (congestive) heart failure: Secondary | ICD-10-CM

## 2023-04-17 DIAGNOSIS — Z9861 Coronary angioplasty status: Secondary | ICD-10-CM

## 2023-04-17 DIAGNOSIS — D509 Iron deficiency anemia, unspecified: Secondary | ICD-10-CM

## 2023-04-17 DIAGNOSIS — D6869 Other thrombophilia: Secondary | ICD-10-CM

## 2023-04-17 NOTE — Progress Notes (Unsigned)
Cardiology Office Note:    Date:  04/18/2023   ID:  [name], DOB August 06, 1951, MRN [id number]  PCP:  Myrlene Broker, MD  Cardiologist:  Liara Holm  ([facility] clinic, [place]) Electrophysiologist:  None   Referring MD: Myrlene Broker, *   Chief Complaint  Patient presents with   Fatigue     History of Present Illness:    [name] is a 72 y.o. female with a hx of CAD (posterior lateral ventricular branch stent in 2009), longstanding persistent atrial fibrillation, diabetes mellitus type 2, hypertension, pericardiocentesis for hemothorax with tamponade in June 2020, while on treatment with oral anticoagulant. She is a retired Doctor, general practice.  She has worsening fatigue and weakness.  She gets tired with minimal activity.  She believes this is why she has had a few falls.  On Monday she fell over backwards.  She had been bending over to get something from the closet and as she was get up she continued falling backwards and hit her head 3 times on the wooden floor.  She has a small ecchymosis on her parietal scalp.  She has a mild headache.  She does not have other focal neurological complaints.  She does not have nausea.  She has not had overt GI bleeding.  She does not have palpitations, dizziness and has not experienced true syncope.  Denies chest pain or shortness of breath.  She has mild ankle edema.  She always has slept in a recliner due to her back problems but does not have overt orthopnea or PND.  She is doing her best to avoid salt rich foods.  We had previously estimated her dry weight to be as low as 170 pounds but and she subsequently gained some true weight and has been relatively steady around 182-183 pounds recently.  Her hemoglobin has been trending down again and is now 9.2 with features of microcytic hypochromic anemia consistent with iron deficiency.  She is scheduled for another iron infusion on September 5 and again on September 12.  She is also  planning to have multiple teeth extracted by Dr. Trinna Balloon, DDS.   Roughly a year ago she was hospitalized for lower GI bleeding with bright blood blood per rectum and a hemoglobin as low as 6.  Work-up with colonoscopy, upper endoscopy and eventually capsule enteroscopy did not really show [place] clear source of bleeding, but there were a couple of AVMs in the colon, 1 in the cecum that appeared to be friable.  This was treated with electrocautery.  She has been on anticoagulation since then without overt bleeding problems.  Her most recent hemoglobin was 10.8 on 03/26/2022  Glycemic control has been fair with a hemoglobin A1c of 7.9%.  Her LDL cholesterol most recently was very good at 36.  She had a nuclear stress test in June 12, 2021 showed low risk findings without evidence of ischemia or previous infarction.  Her last echocardiogram in April 2021 showed LVEF of 50-55%.  This was a decline from June 2020 when the EF was 60%.  The most recent echocardiogram performed in April 2021 showed LVEF of 50-55%.  Both atria are moderately dilated.  There were no serious valvular abnormalities.  She had a normal perfusion pattern on her nuclear stress tests from 2012, 2014, 2016 and October 2022.  Past Medical History:  Diagnosis Date   Anemia, unspecified    Arthritis    Asthma    Atrial fibrillation with RVR (HCC)  a. on Xarelto   Bell's palsy    Facial nerve decompression in 2001   CHF (congestive heart failure) (HCC)    Chronic low back pain    COPD with asthma    Coronary artery disease    Myoview 04/12/11 was entirely normal. ECHO 02/26/08 showed only minor abnormalities. Stenting 05/26/08 of her posterolateral branch to the left circumflex coronary artery. Used a 2.5x64mm Taxus Monorail stent.myoview 2014 was without ischemia   Diabetes mellitus    Type 2   Early cataracts, bilateral    Fatty liver    GERD (gastroesophageal reflux disease)    Glaucoma    Glaucoma    Goiter    Heart  murmur    History of nuclear stress test 2012; 2014   lexiscan; normal pattern of perfusion; normal, low risk scan    Hyperlipidemia    Hypertension    Panic disorder    Pneumonia 2008   Polycystic ovary disease    Hysterectomy in 1982 for this   Shortness of breath dyspnea    ECHO 02/26/08 showed only minor abnormalities   Spinal stenosis     Past Surgical History:  Procedure Laterality Date   ABDOMINAL HYSTERECTOMY  1982   & BSO; for polycystic ovary disease   CARDIOVERSION N/A 12/17/2013   Procedure: CARDIOVERSION;  Surgeon: Chrystie Nose, MD;  Location: [facility] ENDOSCOPY;  Service: Cardiovascular;  Laterality: N/A;   CARDIOVERSION N/A 09/30/2015   Procedure: CARDIOVERSION;  Surgeon: Lars Masson, MD;  Location: [facility] ENDOSCOPY;  Service: Cardiovascular;  Laterality: N/A;   CARDIOVERSION N/A 06/22/2016   Procedure: CARDIOVERSION;  Surgeon: Chilton Si, MD;  Location: [facility] ENDOSCOPY;  Service: Cardiovascular;  Laterality: N/A;   CENTRAL LINE INSERTION  01/13/2019   Procedure: CENTRAL LINE INSERTION;  Surgeon: Dolores Patty, MD;  Location: [facility] INVASIVE CV LAB;  Service: Cardiovascular;;   COLONOSCOPY     last 2009; Dr Matthias Hughs; due 2019   COLONOSCOPY WITH PROPOFOL N/A 01/23/2018   Procedure: COLONOSCOPY WITH PROPOFOL;  Surgeon: Bernette Redbird, MD;  Location: [facility] ENDOSCOPY;  Service: Endoscopy;  Laterality: N/A;   COLONOSCOPY WITH PROPOFOL N/A 11/04/2020   Procedure: COLONOSCOPY WITH PROPOFOL;  Surgeon: Bernette Redbird, MD;  Location: [facility] ENDOSCOPY;  Service: Endoscopy;  Laterality: N/A;   CORONARY ANGIOPLASTY  05/26/2008   Stenting of her posterolateral branch to the left circumflex coronary artery. Used a 2.5x68mm Taxus Monorail stent.   ESOPHAGOGASTRODUODENOSCOPY (EGD) WITH PROPOFOL N/A 01/23/2018   Procedure: ESOPHAGOGASTRODUODENOSCOPY (EGD) WITH PROPOFOL;  Surgeon: Bernette Redbird, MD;  Location: [facility] ENDOSCOPY;  Service: Endoscopy;  Laterality: N/A;   ESOPHAGOGASTRODUODENOSCOPY  (EGD) WITH PROPOFOL N/A 11/04/2020   Procedure: ESOPHAGOGASTRODUODENOSCOPY (EGD) WITH PROPOFOL;  Surgeon: Bernette Redbird, MD;  Location: [facility] ENDOSCOPY;  Service: Endoscopy;  Laterality: N/A;   FACIAL NERVE DECOMPRESSION  2001/2002   bells palsy    GIVENS CAPSULE STUDY N/A 11/04/2020   Procedure: GIVENS CAPSULE STUDY;  Surgeon: Bernette Redbird, MD;  Location: [facility] ENDOSCOPY;  Service: Endoscopy;  Laterality: N/A;   HOT HEMOSTASIS N/A 11/04/2020   Procedure: HOT HEMOSTASIS (ARGON PLASMA COAGULATION/BICAP);  Surgeon: Bernette Redbird, MD;  Location: [facility] ENDOSCOPY;  Service: Endoscopy;  Laterality: N/A;   LAPAROSCOPIC CHOLECYSTECTOMY  06/15/2011    Dr Derrell Lolling   PERICARDIOCENTESIS N/A 01/13/2019   Procedure: PERICARDIOCENTESIS;  Surgeon: Kathleene Hazel, MD;  Location: [facility] INVASIVE CV LAB;  Service: Cardiovascular;  Laterality: N/A;   POLYPECTOMY  11/04/2020   Procedure: POLYPECTOMY;  Surgeon: Bernette Redbird, MD;  Location: [facility] ENDOSCOPY;  Service: Endoscopy;;   RIGHT AND LEFT HEART CATH N/A 01/13/2019   Procedure: RIGHT AND LEFT HEART CATH;  Surgeon: Dolores Patty, MD;  Location: [facility] INVASIVE CV LAB;  Service: Cardiovascular;  Laterality: N/A;   TEE WITHOUT CARDIOVERSION N/A 12/17/2013   Procedure: TRANSESOPHAGEAL ECHOCARDIOGRAM (TEE);  Surgeon: Chrystie Nose, MD;  Location: [facility] ENDOSCOPY;  Service: Cardiovascular;  Laterality: N/A;  trish/ja   TOTAL HIP ARTHROPLASTY Left 09/25/2021   Procedure: LEFT TOTAL HIP ARTHROPLASTY ANTERIOR APPROACH;  Surgeon: Gean Birchwood, MD;  Location: [facility] ORS;  Service: Orthopedics;  Laterality: Left;   TRANSTHORACIC ECHOCARDIOGRAM  02/26/2008   LV systolic function normal with mild conc LVH; LA mildly dilated; trace MR/TR   UPPER GI ENDOSCOPY  2009   negative    Current Medications: Current Meds  Medication Sig   Accu-Chek Softclix Lancets lancets Use as instructed (Patient taking differently: 1 each by Other route See admin instructions. Use as instructed)    apixaban (ELIQUIS) 5 MG TABS tablet Take 1 tablet (5 mg total) by mouth 2 (two) times daily.   diltiazem (CARDIZEM CD) 120 MG 24 hr capsule Take 1 capsule (120 mg total) by mouth daily.   glucose blood (FREESTYLE LITE) test strip Use as instructed (Patient taking differently: 1 each by Other route See admin instructions. Use as instructed)   guaiFENesin (MUCINEX) 600 MG 12 hr tablet Take 600 mg by mouth 2 (two) times daily.   HYDROcodone-acetaminophen (NORCO/VICODIN) 5-325 MG tablet Take 1 tablet by mouth 2 (two) times daily as needed for moderate pain.   JANUMET 50-1000 MG tablet TAKE 1 TABLET BY MOUTH TWICE DAILY WITH A MEAL   latanoprost (XALATAN) 0.005 % ophthalmic solution Place 1 drop into both eyes at bedtime.   lisinopril (ZESTRIL) 10 MG tablet Take 1 tablet (10 mg total) by mouth daily.   LORazepam (ATIVAN) 0.5 MG tablet Take 1 tablet (0.5 mg total) by mouth 2 (two) times daily.   metoprolol succinate (TOPROL-XL) 100 MG 24 hr tablet Take 1 tablet (100 mg total) by mouth 2 (two) times daily.   Multiple Minerals-Vitamins (CALCIUM CITRATE PLUS/MAGNESIUM PO) Take 400 mg by mouth daily.   nystatin (MYCOSTATIN/NYSTOP) powder Apply 1 application topically once a week.   nystatin ointment (MYCOSTATIN) Apply 1 Application topically 2 (two) times daily as needed (IRRITATION).   PARoxetine (PAXIL) 10 MG tablet TAKE 2 AND 1/2 TABLETS BY MOUTH ONCE DAILY   rOPINIRole (REQUIP) 0.5 MG tablet Take 1 tablet (0.5 mg total) by mouth at bedtime.   rosuvastatin (CRESTOR) 10 MG tablet TAKE ONE TABLET AT BEDTIME. (Patient taking differently: Take 10 mg by mouth every evening.)   torsemide (DEMADEX) 20 MG tablet Take 1 tablet (20 mg total) by mouth as needed (swelling and/or weight gain).   traZODone (DESYREL) 100 MG tablet Up to 200 mg at bedtime for sleep   triamcinolone cream (KENALOG) 0.1 % Apply 1 Application topically 2 (two) times daily. (Patient taking differently: Apply 1 Application topically 2 (two)  times daily as needed (Rash).)   umeclidinium-vilanterol (ANORO ELLIPTA) 62.5-25 MCG/ACT AEPB Inhale 1 puff into the lungs daily.     Allergies:   Benadryl [diphenhydramine hcl], Clopidogrel bisulfate, Ioversol, Benadryl [diphenhydramine], and Iodinated contrast media   Social History   Socioeconomic History   Marital status: Married    Spouse name: Not on file   Number of children: 2   Years of education: master's   Highest education level: Not on file  Occupational History   Occupation:  Speech Pathologist    Employer: [employer]  Tobacco Use   Smoking status: Former    Current packs/day: 0.00    Average packs/day: 1.5 packs/day for 30.0 years (45.0 ttl pk-yrs)    Types: Cigarettes    Start date: 08/14/1963    Quit date: 08/13/1993    Years since quitting: 29.6   Smokeless tobacco: Never  Vaping Use   Vaping status: Never Used  Substance and Sexual Activity   Alcohol use: No   Drug use: No   Sexual activity: Not on file  Other Topics Concern   Not on file  Social History Narrative   ** Merged History Encounter **       Social Determinants of Health   Financial Resource Strain: Not on file  Food Insecurity: No Food Insecurity (07/28/2022)   Hunger Vital Sign    Worried About Running Out of Food in the Last Year: Never true    Ran Out of Food in the Last Year: Never true  Transportation Needs: No Transportation Needs (07/28/2022)   PRAPARE - Administrator, Civil Service (Medical): No    Lack of Transportation (Non-Medical): No  Physical Activity: Not on file  Stress: Not on file  Social Connections: Not on file     Family History: The patient's family history includes Cancer in her brother and sister; Colon cancer (age of onset: 77) in her brother; Colon polyps in her sister; Emphysema (age of onset: 71) in her mother; Gout in her brother; Heart attack (age of onset: 66) in her father; Hyperlipidemia in her brother; Hypertension in her brother;  Pneumonia in her maternal grandmother; Ulcers in her mother. There is no history of Diabetes or Stroke.  ROS:   Please see the history of present illness.    All other systems are reviewed and are negative.   EKGs/Labs/Other Studies Reviewed:    The following studies were reviewed today: Echocardiogram 05/20/2022   1. Left ventricular ejection fraction, by estimation, is 60 to 65%. The  left ventricle has normal function. The left ventricle has no regional  wall motion abnormalities. Left ventricular diastolic function could not  be evaluated.   2. Right ventricular systolic function is mildly reduced. The right  ventricular size is normal. Tricuspid regurgitation signal is inadequate  for assessing PA pressure.   3. Left atrial size was mildly dilated.   4. The mitral valve is normal in structure. Trivial mitral valve  regurgitation. No evidence of mitral stenosis.   5. The aortic valve is normal in structure. Aortic valve regurgitation is  mild. No aortic stenosis is present.   6. The inferior vena cava is dilated in size with >50% respiratory  variability, suggesting right atrial pressure of 8 mmHg.   Nuclear stress test 06/12/2021   Findings are consistent with no prior ischemia and no prior myocardial infarction. The study is low risk.   No ST deviation was noted.   LV perfusion is normal. There is no evidence of ischemia. There is no evidence of infarction.   End diastolic cavity size is normal. End systolic cavity size is normal.   Normal perfusion EF may not be accurate due to afib estimated at 51% no RWMAls noted     EKG:  EKG is ordered today and shows atrial fibrillation with controlled ventricular sponsor no ischemic repolarization abnormalities.  She was has some degree of left axis deviation, today this does not meet full criteria for left anterior fascicular  block.  Recent Labs:  02/22/2022 hemoglobin A1c 7.9% 05/18/2022: BNP 102.2 08/29/2022: TSH  2.625 12/17/2022: Magnesium 1.5; Pro B Natriuretic peptide (BNP) 216.0 04/01/2023: ALT 9; BUN 14; Creatinine, Ser 0.86; Hemoglobin 9.2; Platelets 213.0; Potassium 4.7; Sodium 132   11/14/2020 Creatinine 1.33, potassium 4.3, hemoglobin 10.3   04/01/2023 Hemoglobin 9.2, creatinine 0.6, potassium 4.7, ALT 89, platelets 213 K   Recent Lipid Panel    Component Value Date/Time   CHOL 101 06/20/2022 1504   CHOL 110 05/18/2022 1623   TRIG 171.0 (H) 06/20/2022 1504   HDL 41.30 06/20/2022 1504   HDL 44 05/18/2022 1623   CHOLHDL 2 06/20/2022 1504   VLDL 34.2 06/20/2022 1504   LDLCALC 25 06/20/2022 1504   LDLCALC 33 05/18/2022 1623   LDLDIRECT 143.4 01/23/2008 1316    Physical Exam:    VS:  BP 124/70 (BP Location: Left Arm, Patient Position: Sitting, Cuff Size: Large)   Pulse 89   Ht 5\' 2"  (1.575 m)   Wt 182 lb (82.6 kg)   SpO2 90%   BMI 33.29 kg/m     Wt Readings from Last 3 Encounters:  04/18/23 182 lb 8 oz (82.8 kg)  04/17/23 182 lb (82.6 kg)  03/29/23 182 lb (82.6 kg)     General: Alert, oriented x3, no distress, slightly pale Head: no evidence of trauma, PERRL, EOMI, no exophtalmos or lid lag, no myxedema, no xanthelasma; normal ears, nose and oropharynx Neck: Hard to see her jugular venous pulsations and no hepatojugular reflux; brisk carotid pulses without delay and no carotid bruits Chest: clear to auscultation, no signs of consolidation by percussion or palpation, normal fremitus, symmetrical and full respiratory excursions Cardiovascular: normal position and quality of the apical impulse, irregular rhythm, normal first and second heart sounds, no murmurs, rubs or gallops Abdomen: no tenderness or distention, no masses by palpation, no abnormal pulsatility or arterial bruits, normal bowel sounds, no hepatosplenomegaly Extremities: no clubbing, cyanosis or edema; 2+ radial, ulnar and brachial pulses bilaterally; 2+ right femoral, posterior tibial and dorsalis pedis pulses;  2+ left femoral, posterior tibial and dorsalis pedis pulses; no subclavian or femoral bruits Neurological: grossly nonfocal.  Pupils are equal and symmetrically reactive to light, cranial nerves all appear to be intact.  Normal grip strength and lower extremity strength, no evidence of hyperreflexia. Psych: Normal mood and affect    ASSESSMENT:    1. Permanent atrial fibrillation (HCC)   2. Post-traumatic headache, not intractable, unspecified chronicity pattern   3. Chronic diastolic CHF (congestive heart failure) (HCC)   4. Acquired thrombophilia (HCC)   5. Iron deficiency anemia, unspecified iron deficiency anemia type   6. Injury of head, initial encounter   7. CAD S/P percutaneous coronary angioplasty   8. Essential hypertension   9. Dyslipidemia   10. Controlled type 2 diabetes mellitus without complication, without long-term current use of insulin (HCC)        PLAN:    In order of problems listed above:  AFib: Has always been arrhythmia unaware.  Rate control is appropriate.  She has not had obvious bleeding on anticoagulation, but her hemoglobin is steadily drifting down, as has repeatedly occurred in the past.   Rate control is critical since she has a history of tachycardia cardiomyopathy.   CHA2DS2-VASc 6 (age, gender, hypertension, diabetes, CAD, history of heart failure/LV dysfunction).   CHF: Very sedentary, unable to assess functional status.  I this is her new dry weight around 292-183 pounds.    She was  an SGLT2 inhibitor but I do not see this on her list of medications anymore.  On beta-blockers and ACE inhibitor.  Preserved LVEF.  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  Reinforced importance of sodium restriction, signs and symptoms of heart failure were discussed. Anticoagulation: No overt bleeding but again developing iron deficiency anemia. We have previously discussed alternatives to anticoagulation.  I believe she is an excellent candidate for a  Watchman device.  She remains skeptical of this device and wants to continue with the current plan with intermittent iron administration. Anemia: Likely explains her increasing problems with fatigue and maybe even contributes to her falls. Head injury: She hit her head on the wooden floor and has a scalp ecchymosis.  She is on full anticoagulation.  She has a mild headache.  Otherwise her neurological exam is very benign.  I recommended a CT of the head without contrast (note that she has severe allergy to iodinated contrast). CAD: Denies angina.  Low risk recent nuclear stress test.  Near normal LVEF. HTN: Adequate control.  HLP: LDL is acceptable, HDL is chronically low. DM: Improved glycemic control, most recent hemoglobin A1c 7.3 %. Periodic limb movement syndrome: Sees Dr. Vickey Huger for sleep disordered breathing .  On Requip.  COPD: Followed by Dr. Maple Hudson.  FEV1 not too bad at 1.66 L / 82% of predicted. Aortic atherosclerosis: Aortic calcification with normal caliber aorta seen on chest x-ray and chest CT. continue statin therapy.  I have seen SIOBAN LIEBOLD is a 72 y.o. female in the office today who is being considered for a Watchman left atrial appendage closure device.  She has a history of permanent atrial fibrillation.  This patients CHA2DS2-VASc Score and unadjusted Ischemic Stroke Rate (% per year) is equal to 9.7 % stroke rate/year from a score of 6 which necessitates long term oral anticoagulation to prevent stroke.  Unfortunately, She is not felt to be a long term Warfarin candidate secondary to recurrent iron deficiency anemia due to GI bleeding.  The patients chart has been reviewed and I feel that they would be a candidate for short term oral anticoagulation.  Procedural risks for the Watchman implant have been reviewed with the patient including a 1% risk of stroke, 2% risk of perforation, 0.1% risk of device embolization.  Given the patient's poor candidacy for long-term oral  anticoagulation and ability to tolerate short term oral anticoagulation I have recommended the watchman left atrial appendage closure system.   Medication Adjustments/Labs and Tests Ordered: Current medicines are reviewed at length with the patient today.  Concerns regarding medicines are outlined above.  Orders Placed This Encounter  Procedures   CT HEAD WO CONTRAST ( )   EKG 12-Lead    No orders of the defined types were placed in this encounter.    Patient Instructions  Medication Instructions:  No changes *If you need a refill on your cardiac medications before your next appointment, please call your pharmacy*  Testing/Procedures: CT of the head   Follow-Up: At [facility], you and your health needs are our priority.  As part of our continuing mission to provide you with exceptional heart care, we have created designated Provider Care Teams.  These Care Teams include your primary Cardiologist (physician) and Advanced Practice Providers (APPs -  Physician Assistants and Nurse Practitioners) who all work together to provide you with the care you need, when you need it.  We recommend signing up for the patient portal called "MyChart".  Sign up  information is provided on this After Visit Summary.  MyChart is used to connect with patients for Virtual Visits (Telemedicine).  Patients are able to view lab/test results, encounter notes, upcoming appointments, etc.  Non-urgent messages can be sent to your provider as well.   To learn more about what you can do with MyChart, go to [email or web address].    Your next appointment:   6 month(s)  Provider:   Thurmon Fair, MD       Signed, Thurmon Fair, MD  04/18/2023 8:02 PM    [facility]

## 2023-04-17 NOTE — Patient Instructions (Signed)
Medication Instructions:  No changes *If you need a refill on your cardiac medications before your next appointment, please call your pharmacy*  Testing/Procedures: CT of the head   Follow-Up: At [facility], you and your health needs are our priority.  As part of our continuing mission to provide you with exceptional heart care, we have created designated Provider Care Teams.  These Care Teams include your primary Cardiologist (physician) and Advanced Practice Providers (APPs -  Physician Assistants and Nurse Practitioners) who all work together to provide you with the care you need, when you need it.  We recommend signing up for the patient portal called "MyChart".  Sign up information is provided on this After Visit Summary.  MyChart is used to connect with patients for Virtual Visits (Telemedicine).  Patients are able to view lab/test results, encounter notes, upcoming appointments, etc.  Non-urgent messages can be sent to your provider as well.   To learn more about what you can do with MyChart, go to [email or web address].    Your next appointment:   6 month(s)  Provider:   Thurmon Fair, MD

## 2023-04-18 ENCOUNTER — Ambulatory Visit (INDEPENDENT_AMBULATORY_CARE_PROVIDER_SITE_OTHER): Payer: PPO

## 2023-04-18 ENCOUNTER — Telehealth: Payer: Self-pay | Admitting: Emergency Medicine

## 2023-04-18 ENCOUNTER — Encounter: Payer: Self-pay | Admitting: Cardiovascular Disease

## 2023-04-18 VITALS — BP 125/73 | HR 71 | Temp 97.7°F | Resp 12 | Ht 62.0 in | Wt 182.5 lb

## 2023-04-18 DIAGNOSIS — D509 Iron deficiency anemia, unspecified: Secondary | ICD-10-CM

## 2023-04-18 DIAGNOSIS — D500 Iron deficiency anemia secondary to blood loss (chronic): Secondary | ICD-10-CM

## 2023-04-18 MED ORDER — SODIUM CHLORIDE 0.9 % IV SOLN
510.0000 mg | Freq: Once | INTRAVENOUS | Status: AC
  Administered 2023-04-18: 510 mg via INTRAVENOUS
  Filled 2023-04-18: qty 17

## 2023-04-18 MED ORDER — ACETAMINOPHEN 325 MG PO TABS
650.0000 mg | ORAL_TABLET | Freq: Once | ORAL | Status: AC
  Administered 2023-04-18: 650 mg via ORAL
  Filled 2023-04-18: qty 2

## 2023-04-18 MED ORDER — DIPHENHYDRAMINE HCL 25 MG PO CAPS: 25.0000 mg | ORAL_CAPSULE | Freq: Once | ORAL | Status: DC

## 2023-04-18 NOTE — Telephone Encounter (Signed)
Left message stating that a water bottle- Hydro Flask was found in the bathroom (she was the last patient in the bathroom/last patients seen yesterday) wanted to see if it was hers?  I will leave it at the front desk.

## 2023-04-18 NOTE — Progress Notes (Signed)
Diagnosis: Iron Deficiency Anemia  Provider:  Chilton Greathouse MD  Procedure: IV Infusion  IV Type: Peripheral, IV Location: R Antecubital  Feraheme (Ferumoxytol), Dose: 510 mg  Infusion Start Time: 1444  Infusion Stop Time: 1504  Post Infusion IV Care: Patient declined observation and Peripheral IV Discontinued  Discharge: Condition: Good, Destination: Home . AVS Declined  Performed by:  Adriana Mccallum, RN

## 2023-04-19 ENCOUNTER — Telehealth: Payer: Self-pay | Admitting: Podiatry

## 2023-04-19 NOTE — Telephone Encounter (Signed)
Pt needs pain medication refill.  

## 2023-04-19 NOTE — Telephone Encounter (Signed)
We don't write for chronic pain medicine

## 2023-04-20 ENCOUNTER — Other Ambulatory Visit: Payer: Self-pay | Admitting: Internal Medicine

## 2023-04-22 ENCOUNTER — Encounter: Payer: Self-pay | Admitting: Internal Medicine

## 2023-04-22 NOTE — Progress Notes (Unsigned)
Subjective:    Patient ID: [name], female    DOB: 08/15/1950, 72 y.o.   MRN: [id number]      HPI [name] is here for No chief complaint on file.   Having multiple teeth removed by Dr Trinna Balloon DDS  Just saw cardiology -   H/o anemia - scheduled for iron infusion 9/5, 9/12.  GI work up was negative except AVMs noted in colon.   Medications and allergies reviewed with patient and updated if appropriate.  Current Outpatient Medications on File Prior to Visit  Medication Sig Dispense Refill   Accu-Chek Softclix Lancets lancets Use as instructed (Patient taking differently: 1 each by Other route See admin instructions. Use as instructed) 100 each 12   albuterol (VENTOLIN HFA) 108 (90 Base) MCG/ACT inhaler Inhale 1 puff into the lungs every 6 (six) hours as needed for wheezing or shortness of breath. (Patient not taking: Reported on 04/17/2023)     apixaban (ELIQUIS) 5 MG TABS tablet Take 1 tablet (5 mg total) by mouth 2 (two) times daily. 60 tablet 5   ascorbic acid (VITAMIN C) 1000 MG tablet Take 1,000 mg by mouth daily. (Patient not taking: Reported on 04/17/2023)     diltiazem (CARDIZEM CD) 120 MG 24 hr capsule Take 1 capsule (120 mg total) by mouth daily. 90 capsule 3   famotidine (PEPCID) 20 MG tablet Take 20 mg by mouth 2 (two) times daily. (Patient not taking: Reported on 04/17/2023)     glucose blood (FREESTYLE LITE) test strip Use as instructed (Patient taking differently: 1 each by Other route See admin instructions. Use as instructed) 100 each 12   guaiFENesin (MUCINEX) 600 MG 12 hr tablet Take 600 mg by mouth 2 (two) times daily.     HYDROcodone-acetaminophen (NORCO/VICODIN) 5-325 MG tablet Take 1 tablet by mouth 2 (two) times daily as needed for moderate pain. 60 tablet 0   JANUMET 50-1000 MG tablet TAKE 1 TABLET BY MOUTH TWICE DAILY WITH A MEAL 180 tablet 9   latanoprost (XALATAN) 0.005 % ophthalmic solution Place 1 drop into both eyes at bedtime.     lisinopril  (ZESTRIL) 10 MG tablet Take 1 tablet (10 mg total) by mouth daily. 90 tablet 3   LORazepam (ATIVAN) 0.5 MG tablet Take 1 tablet (0.5 mg total) by mouth 2 (two) times daily. 60 tablet 1   metoprolol succinate (TOPROL-XL) 100 MG 24 hr tablet Take 1 tablet (100 mg total) by mouth 2 (two) times daily. 180 tablet 3   metoprolol tartrate (LOPRESSOR) 25 MG tablet TAKE 1 TABLET ONCE DAILY AS NEEDED FOR RAPID PALPITATIONS. (Patient not taking: Reported on 04/17/2023) 30 tablet 1   Multiple Minerals-Vitamins (CALCIUM CITRATE PLUS/MAGNESIUM PO) Take 400 mg by mouth daily.     nystatin (MYCOSTATIN/NYSTOP) powder Apply 1 application topically once a week. 60 g 5   nystatin ointment (MYCOSTATIN) Apply 1 Application topically 2 (two) times daily as needed (IRRITATION). 100 g 6   PARoxetine (PAXIL) 10 MG tablet TAKE 2 AND 1/2 TABLETS BY MOUTH ONCE DAILY 225 tablet 3   rOPINIRole (REQUIP XL) 2 MG 24 hr tablet Take 1 tablet (2 mg total) by mouth at bedtime. (Patient not taking: Reported on 04/17/2023) 90 tablet 3   rOPINIRole (REQUIP) 0.5 MG tablet Take 1 tablet (0.5 mg total) by mouth at bedtime. 90 tablet 3   rosuvastatin (CRESTOR) 10 MG tablet TAKE ONE TABLET AT BEDTIME. (Patient taking differently: Take 10 mg by mouth every evening.) 90  tablet 3   senna (SENOKOT) 8.6 MG TABS Take 1 tablet by mouth at bedtime as needed for mild constipation. (Patient not taking: Reported on 04/17/2023)     torsemide (DEMADEX) 20 MG tablet Take 1 tablet (20 mg total) by mouth as needed (swelling and/or weight gain). 30 tablet 0   traZODone (DESYREL) 100 MG tablet Up to 200 mg at bedtime for sleep 180 tablet 3   triamcinolone cream (KENALOG) 0.1 % Apply 1 Application topically 2 (two) times daily. (Patient taking differently: Apply 1 Application topically 2 (two) times daily as needed (Rash).) 100 g 0   umeclidinium-vilanterol (ANORO ELLIPTA) 62.5-25 MCG/ACT AEPB Inhale 1 puff into the lungs daily. 60 each 12   No current  facility-administered medications on file prior to visit.    Review of Systems     Objective:  There were no vitals filed for this visit. BP Readings from Last 3 Encounters:  04/18/23 125/73  04/17/23 124/70  03/29/23 128/72   Wt Readings from Last 3 Encounters:  04/18/23 182 lb 8 oz (82.8 kg)  04/17/23 182 lb (82.6 kg)  03/29/23 182 lb (82.6 kg)   There is no height or weight on file to calculate BMI.    Physical Exam         Assessment & Plan:    See Problem List for Assessment and Plan of chronic medical problems.

## 2023-04-23 ENCOUNTER — Ambulatory Visit (INDEPENDENT_AMBULATORY_CARE_PROVIDER_SITE_OTHER): Payer: PPO | Admitting: Internal Medicine

## 2023-04-23 VITALS — BP 130/74 | HR 112 | Temp 98.5°F | Ht 62.0 in | Wt 181.3 lb

## 2023-04-23 DIAGNOSIS — I25119 Atherosclerotic heart disease of native coronary artery with unspecified angina pectoris: Secondary | ICD-10-CM

## 2023-04-23 DIAGNOSIS — I4821 Permanent atrial fibrillation: Secondary | ICD-10-CM

## 2023-04-23 DIAGNOSIS — Z7984 Long term (current) use of oral hypoglycemic drugs: Secondary | ICD-10-CM | POA: Diagnosis not present

## 2023-04-23 DIAGNOSIS — I509 Heart failure, unspecified: Secondary | ICD-10-CM | POA: Diagnosis not present

## 2023-04-23 DIAGNOSIS — I10 Essential (primary) hypertension: Secondary | ICD-10-CM

## 2023-04-23 DIAGNOSIS — E1169 Type 2 diabetes mellitus with other specified complication: Secondary | ICD-10-CM | POA: Diagnosis not present

## 2023-04-23 MED ORDER — FAMOTIDINE 20 MG PO TABS: 20.0000 mg | ORAL_TABLET | Freq: Two times a day (BID) | ORAL | 1 refills | Status: AC

## 2023-04-23 NOTE — Patient Instructions (Addendum)
       Medications changes include :  none     Will send form to your dentist for your upcoming procedure.

## 2023-04-24 ENCOUNTER — Other Ambulatory Visit: Payer: Self-pay | Admitting: Internal Medicine

## 2023-04-24 ENCOUNTER — Telehealth: Payer: Self-pay | Admitting: Cardiovascular Disease

## 2023-04-24 ENCOUNTER — Other Ambulatory Visit: Payer: Self-pay | Admitting: Cardiovascular Disease

## 2023-04-24 NOTE — Telephone Encounter (Signed)
Patient states that she was seen 09/05 and believes she may have left a grey hydro flask there and would like to know if this was found. Please advise.

## 2023-04-24 NOTE — Progress Notes (Signed)
Diagnosis: Iron Deficiency Anemia  Provider:  Chilton Greathouse MD  Procedure: IV Infusion  IV Type: Peripheral, IV Location: R Antecubital  Feraheme (Ferumoxytol), Dose: 510 mg  Infusion Start Time: 1444  Infusion Stop Time: 1504  Post Infusion IV Care: Patient declined observation and Peripheral IV Discontinued  Discharge: Condition: Good, Destination: Home . AVS Declined  Performed by:  Adriana Mccallum RN

## 2023-04-24 NOTE — Telephone Encounter (Signed)
Spoke with patient and she is aware that her hydro flask is up front in the lost and found draw  with her name on it. She states she will pick it up by the end of the week

## 2023-04-25 ENCOUNTER — Other Ambulatory Visit: Payer: Self-pay | Admitting: Internal Medicine

## 2023-04-25 ENCOUNTER — Ambulatory Visit (INDEPENDENT_AMBULATORY_CARE_PROVIDER_SITE_OTHER): Payer: PPO

## 2023-04-25 ENCOUNTER — Telehealth: Payer: Self-pay

## 2023-04-25 VITALS — BP 144/83 | HR 98 | Temp 98.4°F | Resp 16 | Ht 62.0 in | Wt 177.5 lb

## 2023-04-25 DIAGNOSIS — D500 Iron deficiency anemia secondary to blood loss (chronic): Secondary | ICD-10-CM

## 2023-04-25 DIAGNOSIS — D509 Iron deficiency anemia, unspecified: Secondary | ICD-10-CM

## 2023-04-25 MED ORDER — DIPHENHYDRAMINE HCL 25 MG PO CAPS: 25.0000 mg | ORAL_CAPSULE | Freq: Once | ORAL | Status: DC

## 2023-04-25 MED ORDER — SODIUM CHLORIDE 0.9 % IV SOLN
510.0000 mg | Freq: Once | INTRAVENOUS | Status: AC
  Administered 2023-04-25: 510 mg via INTRAVENOUS
  Filled 2023-04-25: qty 17

## 2023-04-25 MED ORDER — ACETAMINOPHEN 325 MG PO TABS
650.0000 mg | ORAL_TABLET | Freq: Once | ORAL | Status: AC
  Administered 2023-04-25: 650 mg via ORAL
  Filled 2023-04-25: qty 2

## 2023-04-25 NOTE — Telephone Encounter (Signed)
Form completed and faxed today to Dr. Carmelia Bake.  Conformation received for 11 pages.

## 2023-04-25 NOTE — Progress Notes (Signed)
Diagnosis: Acute Anemia  Provider:  Chilton Greathouse MD  Procedure: IV Infusion  IV Type: Peripheral, IV Location: L Forearm  Feraheme (Ferumoxytol), Dose: 510 mg  Infusion Start Time: 1537 (Pt declined full 30 minute wait post pre-med administration).  Infusion Stop Time: 1554  Post Infusion IV Care: Patient declined observation and Peripheral IV Discontinued  Discharge: Condition: Good, Destination: Home . AVS Provided  Performed by:  Wyvonne Lenz, RN

## 2023-04-26 ENCOUNTER — Ambulatory Visit (HOSPITAL_COMMUNITY)
Admission: RE | Admit: 2023-04-26 | Discharge: 2023-04-26 | Disposition: A | Discharge status: Home / Self Care | Payer: PPO | Source: Ambulatory Visit | Attending: Cardiovascular Disease | Admitting: Cardiovascular Disease

## 2023-04-26 DIAGNOSIS — S0990XA Unspecified injury of head, initial encounter: Secondary | ICD-10-CM | POA: Diagnosis not present

## 2023-04-26 DIAGNOSIS — S069X0A Unspecified intracranial injury without loss of consciousness, initial encounter: Secondary | ICD-10-CM | POA: Diagnosis not present

## 2023-04-26 DIAGNOSIS — G44309 Post-traumatic headache, unspecified, not intractable: Secondary | ICD-10-CM | POA: Diagnosis not present

## 2023-05-06 ENCOUNTER — Telehealth: Payer: Self-pay | Admitting: Pharmacist

## 2023-05-06 ENCOUNTER — Ambulatory Visit: Payer: PPO | Admitting: Podiatry

## 2023-05-06 NOTE — Telephone Encounter (Signed)
Patient appeared on the quality metric gap report for lisinopril 20 mg. Calling to clarify how she is currently taking it as there appears to be a discrepancy with her medication list. Left voicemail with call back number.  Arbutus Leas, PharmD, BCPS [facility] [phone]

## 2023-05-09 NOTE — Telephone Encounter (Signed)
Patient appeared on the quality metric gap report for lisinopril 20 mg. Calling to clarify how she is currently taking it as there appears to be a discrepancy with her medication list.  Called x4, left voicemail x3 with call back number. Unable to reach.   Arbutus Leas, PharmD, BCPS [facility] [phone]

## 2023-05-16 ENCOUNTER — Encounter: Payer: Self-pay | Admitting: Podiatry

## 2023-05-16 ENCOUNTER — Other Ambulatory Visit: Payer: Self-pay | Admitting: Pharmacist

## 2023-05-16 ENCOUNTER — Ambulatory Visit: Payer: PPO | Admitting: Podiatry

## 2023-05-16 ENCOUNTER — Telehealth: Payer: Self-pay

## 2023-05-16 ENCOUNTER — Other Ambulatory Visit: Payer: Self-pay | Admitting: Internal Medicine

## 2023-05-16 DIAGNOSIS — L600 Ingrowing nail: Secondary | ICD-10-CM | POA: Diagnosis not present

## 2023-05-16 NOTE — Progress Notes (Signed)
Pharmacy Quality Measure Review  This patient is appearing on a report for being at risk of failing the adherence measure for hypertension (ACEi/ARB) medications this calendar year.   Medication: Lisinopril 20 mg daily Last fill date: 01/10/2023 for 90 day supply  Discrepancy noted in patient's chart. She had been filling lisinopril 20 mg however her med lit says lisinopril 10 mg, which appears to have been changed in February 2024.  Spoke to patient by phone. She is unable to find lisinopril bottle and believes she has not been taking it. Recommended patient call her pharmacy to get refill started.  Arbutus Leas, PharmD, BCPS [facility] [phone]

## 2023-05-16 NOTE — Telephone Encounter (Signed)
Contacted patient on preferred number listed in notes for scheduled AWV. Patient declined visit at this time.

## 2023-05-17 ENCOUNTER — Other Ambulatory Visit: Payer: Self-pay | Admitting: Internal Medicine

## 2023-05-17 ENCOUNTER — Ambulatory Visit: Payer: PPO | Admitting: Internal Medicine

## 2023-05-17 ENCOUNTER — Encounter: Payer: Self-pay | Admitting: Internal Medicine

## 2023-05-17 VITALS — BP 124/84 | HR 105 | Temp 97.7°F | Ht 62.0 in | Wt 178.0 lb

## 2023-05-17 DIAGNOSIS — E785 Hyperlipidemia, unspecified: Secondary | ICD-10-CM | POA: Diagnosis not present

## 2023-05-17 DIAGNOSIS — Z23 Encounter for immunization: Secondary | ICD-10-CM | POA: Diagnosis not present

## 2023-05-17 DIAGNOSIS — E1169 Type 2 diabetes mellitus with other specified complication: Secondary | ICD-10-CM

## 2023-05-17 DIAGNOSIS — Z7984 Long term (current) use of oral hypoglycemic drugs: Secondary | ICD-10-CM | POA: Diagnosis not present

## 2023-05-17 DIAGNOSIS — D500 Iron deficiency anemia secondary to blood loss (chronic): Secondary | ICD-10-CM

## 2023-05-17 DIAGNOSIS — D689 Coagulation defect, unspecified: Secondary | ICD-10-CM

## 2023-05-17 DIAGNOSIS — Z0000 Encounter for general adult medical examination without abnormal findings: Secondary | ICD-10-CM | POA: Diagnosis not present

## 2023-05-17 LAB — COMPREHENSIVE METABOLIC PANEL
ALT: 11 U/L (ref 0–35)
AST: 23 U/L (ref 0–37)
Albumin: 3.9 g/dL (ref 3.5–5.2)
Alkaline Phosphatase: 50 U/L (ref 39–117)
BUN: 7 mg/dL (ref 6–23)
CO2: 28 meq/L (ref 19–32)
Calcium: 9 mg/dL (ref 8.4–10.5)
Chloride: 99 meq/L (ref 96–112)
Creatinine, Ser: 0.73 mg/dL (ref 0.40–1.20)
GFR: 80.93 mL/min (ref >60.00)
Glucose, Bld: 152 mg/dL — ABNORMAL HIGH (ref 70–99)
Potassium: 4.8 meq/L (ref 3.5–5.1)
Sodium: 133 meq/L — ABNORMAL LOW (ref 135–145)
Total Bilirubin: 0.4 mg/dL (ref 0.2–1.2)
Total Protein: 6.9 g/dL (ref 6.0–8.3)

## 2023-05-17 LAB — CBC
HCT: 34.7 % — ABNORMAL LOW (ref 36.0–46.0)
Hemoglobin: 11.1 g/dL — ABNORMAL LOW (ref 12.0–15.0)
MCHC: 32.1 g/dL (ref 30.0–36.0)
MCV: 87.5 fL (ref 78.0–100.0)
Platelets: 221 10*3/uL (ref 150.0–400.0)
RBC: 3.97 Mil/uL (ref 3.87–5.11)
RDW: 21.2 % — ABNORMAL HIGH (ref 11.5–15.5)
WBC: 8.2 10*3/uL (ref 4.0–10.5)

## 2023-05-17 LAB — FERRITIN: Ferritin: 127.1 ng/mL (ref 10.0–291.0)

## 2023-05-17 LAB — HEMOGLOBIN A1C: Hgb A1c MFr Bld: 5.9 % (ref 4.6–6.5)

## 2023-05-17 MED ORDER — METOPROLOL SUCCINATE ER 100 MG PO TB24: 100.0000 mg | ORAL_TABLET | Freq: Two times a day (BID) | ORAL | 3 refills | Status: DC

## 2023-05-17 MED ORDER — ACCU-CHEK SOFTCLIX LANCETS MISC: 12 refills | Status: DC

## 2023-05-17 MED ORDER — PAROXETINE HCL 10 MG PO TABS: 25.0000 mg | ORAL_TABLET | Freq: Every day | ORAL | 3 refills | Status: DC

## 2023-05-17 MED ORDER — NYSTATIN 100000 UNIT/GM EX OINT: 1.0000 | TOPICAL_OINTMENT | Freq: Two times a day (BID) | CUTANEOUS | 6 refills | Status: DC | PRN

## 2023-05-17 MED ORDER — TRIAMCINOLONE ACETONIDE 0.1 % EX CREA: 1.0000 | TOPICAL_CREAM | Freq: Two times a day (BID) | CUTANEOUS | 0 refills | Status: AC

## 2023-05-17 MED ORDER — NYSTATIN 100000 UNIT/GM EX POWD: CUTANEOUS | 5 refills | Status: DC

## 2023-05-17 MED ORDER — BENZONATATE 200 MG PO CAPS: 200.0000 mg | ORAL_CAPSULE | Freq: Three times a day (TID) | ORAL | 3 refills | Status: DC | PRN

## 2023-05-17 MED ORDER — FREESTYLE LITE TEST VI STRP: ORAL_STRIP | 12 refills | Status: AC

## 2023-05-17 MED ORDER — JANUMET 50-1000 MG PO TABS: 1.0000 | ORAL_TABLET | Freq: Two times a day (BID) | ORAL | 3 refills | Status: DC

## 2023-05-17 NOTE — Assessment & Plan Note (Signed)
Due to eliquis for a fib. Intermittent GI bleeding with blood in stool but not recent. Checking CBC.

## 2023-05-17 NOTE — Progress Notes (Signed)
Subjective:   Patient ID: [name], female   DOB: 72 y.o.   MRN: [id number]   HPI Patient presents stating needs to have the nails taken off but needs to wait because of the trip coming up points to the fourth and also the third nail on the right foot   ROS      Objective:  Physical Exam  Neurovascular status intact swelling around the MPJs low-grade no break in tissue noted appears to be more inflammatory with significant thickness pain third and fourth nailbed right     Assessment:  Chronic structural nail disease right with discomfort and mild distal inflammation     Plan:  Reviewed both conditions educated her on nail surgery which will need to be done and patient will have this done in the near future with all questions answered today

## 2023-05-17 NOTE — Assessment & Plan Note (Signed)
Flu shot given. Pneumonia complete. Shingrix complete. Tetanus due at pharmacy. Colonoscopy due 2032. Mammogram due 2024, pap smear aged out and dexa complete. Counseled about sun safety and mole surveillance. Counseled about the dangers of distracted driving. Given 10 year screening recommendations.

## 2023-05-17 NOTE — Progress Notes (Signed)
Subjective:   Patient ID: [name], female    DOB: 06/07/1951, 72 y.o.   MRN: [id number]  HPI Here for medicare wellness and physical, no new complaints. Please see A/P for status and treatment of chronic medical problems.   Diet: DM since diabetic Physical activity: sedentary Depression/mood screen: negative Hearing: intact to whispered voice Visual acuity: grossly normal, performs annual eye exam  ADLs: capable Fall risk: low to medium, uses good safety Home safety: good Cognitive evaluation: intact to orientation, naming, recall and repetition EOL planning: adv directives discussed  Flowsheet Row Office Visit from 12/17/2022 in [facility] [place]  PHQ-2 Total Score 0       Flowsheet Row Office Visit from 12/17/2022 in [facility] [place]  PHQ-9 Total Score 0         07/29/2022   10:00 AM 08/31/2022    3:30 PM 10/09/2022    2:31 PM 12/17/2022    2:51 PM 05/17/2023    2:36 PM  Fall Risk  Falls in the past year?  0 0 0 1  Was there an injury with Fall?  0 0 0   Fall Risk Category Calculator  0 0 0   (RETIRED) Patient Fall Risk Level Moderate fall risk      Fall risk Follow up  Falls evaluation completed Falls evaluation completed Falls evaluation completed    I have personally reviewed and have noted 1. The patient's medical and social history - reviewed today no changes 2. Their use of alcohol, tobacco or illicit drugs 3. Their current medications and supplements 4. The patient's functional ability including ADL's, fall risks, home safety risks and hearing or visual impairment. 5. Diet and physical activities 6. Evidence for depression or mood disorders 7. Care team reviewed and updated 8.  The patient is on an opioid pain medication and this was reviewed with patient and non-opioid pain medication options were reviewed and offered to patient, their pain treatment plan and severity was discussed with them. We  have considered referrals as appropriate for patient. Opioid risk factors were also considered and reviewed.   Patient Care Team: Myrlene Broker, MD as PCP - General (Internal Medicine) Croitoru, Rachelle Hora, MD as PCP - Cardiology (Cardiology) Myrlene Broker, MD (Internal Medicine) Past Medical History:  Diagnosis Date   Anemia, unspecified    Arthritis    Asthma    Atrial fibrillation with RVR (HCC)    a. on Xarelto   Bell's palsy    Facial nerve decompression in 2001   CHF (congestive heart failure) (HCC)    Chronic low back pain    COPD with asthma (HCC)    Coronary artery disease    Myoview 04/12/11 was entirely normal. ECHO 02/26/08 showed only minor abnormalities. Stenting 05/26/08 of her posterolateral branch to the left circumflex coronary artery. Used a 2.5x71mm Taxus Monorail stent.myoview 2014 was without ischemia   Diabetes mellitus    Type 2   Early cataracts, bilateral    Fatty liver    GERD (gastroesophageal reflux disease)    Glaucoma    Glaucoma    Goiter    Heart murmur    History of nuclear stress test 2012; 2014   lexiscan; normal pattern of perfusion; normal, low risk scan    Hyperlipidemia    Hypertension    Panic disorder    Pneumonia 2008   Polycystic ovary disease    Hysterectomy in 1982 for this  Shortness of breath dyspnea    ECHO 02/26/08 showed only minor abnormalities   Spinal stenosis    Past Surgical History:  Procedure Laterality Date   ABDOMINAL HYSTERECTOMY  1982   & BSO; for polycystic ovary disease   CARDIOVERSION N/A 12/17/2013   Procedure: CARDIOVERSION;  Surgeon: Chrystie Nose, MD;  Location[facility] ENDOSCOPY;  Service: Cardiovascular;  Laterality: N/A;   CARDIOVERSION N/A 09/30/2015   Procedure: CARDIOVERSION;  Surgeon: Lars Masson, MD;  Location: [facility] ENDOSCOPY;  Service: Cardiovascular;  Laterality: N/A;   CARDIOVERSION N/A 06/22/2016   Procedure: CARDIOVERSION;  Surgeon: Chilton Si, MD;  Location: [facility] ENDOSCOPY;   Service: Cardiovascular;  Laterality: N/A;   CENTRAL LINE INSERTION  01/13/2019   Procedure: CENTRAL LINE INSERTION;  Surgeon: Dolores Patty, MD;  Location: [facility] INVASIVE CV LAB;  Service: Cardiovascular;;   COLONOSCOPY     last 2009; Dr Matthias Hughs; due 2019   COLONOSCOPY WITH PROPOFOL N/A 01/23/2018   Procedure: COLONOSCOPY WITH PROPOFOL;  Surgeon: Bernette Redbird, MD;  Location: [facility] ENDOSCOPY;  Service: Endoscopy;  Laterality: N/A;   COLONOSCOPY WITH PROPOFOL N/A 11/04/2020   Procedure: COLONOSCOPY WITH PROPOFOL;  Surgeon: Bernette Redbird, MD;  Location: [facility] ENDOSCOPY;  Service: Endoscopy;  Laterality: N/A;   CORONARY ANGIOPLASTY  05/26/2008   Stenting of her posterolateral branch to the left circumflex coronary artery. Used a 2.5x38mm Taxus Monorail stent.   ESOPHAGOGASTRODUODENOSCOPY (EGD) WITH PROPOFOL N/A 01/23/2018   Procedure: ESOPHAGOGASTRODUODENOSCOPY (EGD) WITH PROPOFOL;  Surgeon: Bernette Redbird, MD;  Location: [facility] ENDOSCOPY;  Service: Endoscopy;  Laterality: N/A;   ESOPHAGOGASTRODUODENOSCOPY (EGD) WITH PROPOFOL N/A 11/04/2020   Procedure: ESOPHAGOGASTRODUODENOSCOPY (EGD) WITH PROPOFOL;  Surgeon: Bernette Redbird, MD;  Location: [facility] ENDOSCOPY;  Service: Endoscopy;  Laterality: N/A;   FACIAL NERVE DECOMPRESSION  2001/2002   bells palsy    GIVENS CAPSULE STUDY N/A 11/04/2020   Procedure: GIVENS CAPSULE STUDY;  Surgeon: Bernette Redbird, MD;  Location: [facility] ENDOSCOPY;  Service: Endoscopy;  Laterality: N/A;   HOT HEMOSTASIS N/A 11/04/2020   Procedure: HOT HEMOSTASIS (ARGON PLASMA COAGULATION/BICAP);  Surgeon: Bernette Redbird, MD;  Location: [facility] ENDOSCOPY;  Service: Endoscopy;  Laterality: N/A;   LAPAROSCOPIC CHOLECYSTECTOMY  06/15/2011    Dr Derrell Lolling   PERICARDIOCENTESIS N/A 01/13/2019   Procedure: PERICARDIOCENTESIS;  Surgeon: Kathleene Hazel, MD;  Location: [facility] INVASIVE CV LAB;  Service: Cardiovascular;  Laterality: N/A;   POLYPECTOMY  11/04/2020   Procedure: POLYPECTOMY;  Surgeon: Bernette Redbird, MD;  Location: [facility] ENDOSCOPY;  Service: Endoscopy;;   RIGHT AND LEFT HEART CATH N/A 01/13/2019   Procedure: RIGHT AND LEFT HEART CATH;  Surgeon: Dolores Patty, MD;  Location: [facility] INVASIVE CV LAB;  Service: Cardiovascular;  Laterality: N/A;   TEE WITHOUT CARDIOVERSION N/A 12/17/2013   Procedure: TRANSESOPHAGEAL ECHOCARDIOGRAM (TEE);  Surgeon: Chrystie Nose, MD;  Location: [facility] ENDOSCOPY;  Service: Cardiovascular;  Laterality: N/A;  trish/ja   TOTAL HIP ARTHROPLASTY Left 09/25/2021   Procedure: LEFT TOTAL HIP ARTHROPLASTY ANTERIOR APPROACH;  Surgeon: Gean Birchwood, MD;  Location: [facility] ORS;  Service: Orthopedics;  Laterality: Left;   TRANSTHORACIC ECHOCARDIOGRAM  02/26/2008   LV systolic function normal with mild conc LVH; LA mildly dilated; trace MR/TR   UPPER GI ENDOSCOPY  2009   negative   Family History  Problem Relation Age of Onset   Heart attack Father 26       2nd MI at 54   Colon cancer Brother 23   Gout Brother    Ulcers Mother    Emphysema  Mother 56   Colon polyps Sister    Cancer Sister        Basal cell carcinoma   Pneumonia Maternal Grandmother    Hypertension Brother    Hyperlipidemia Brother    Cancer Brother        Skin   Diabetes Neg Hx    Stroke Neg Hx    Review of Systems  Constitutional:  Positive for fatigue.  HENT: Negative.    Eyes: Negative.   Respiratory:  Positive for shortness of breath. Negative for cough and chest tightness.   Cardiovascular:  Negative for chest pain, palpitations and leg swelling.  Gastrointestinal:  Negative for abdominal distention, abdominal pain, constipation, diarrhea, nausea and vomiting.  Musculoskeletal: Negative.   Skin: Negative.   Neurological: Negative.   Psychiatric/Behavioral: Negative.      Objective:  Physical Exam Constitutional:      Appearance: She is well-developed.  HENT:     Head: Normocephalic and atraumatic.  Cardiovascular:     Rate and Rhythm: Normal rate. Rhythm irregular.  Pulmonary:      Effort: Pulmonary effort is normal. No respiratory distress.     Breath sounds: Normal breath sounds. No wheezing or rales.  Abdominal:     General: Bowel sounds are normal. There is no distension.     Palpations: Abdomen is soft.     Tenderness: There is no abdominal tenderness. There is no rebound.  Musculoskeletal:     Cervical back: Normal range of motion.  Skin:    General: Skin is warm and dry.  Neurological:     Mental Status: She is alert and oriented to person, place, and time.     Coordination: Coordination normal.     Vitals:   05/17/23 1434  BP: 124/84  Pulse: (!) 105  Temp: 97.7 F (36.5 C)  TempSrc: Oral  SpO2: 95%  Weight: 178 lb (80.7 kg)  Height: 5\' 2"  (1.575 m)    Assessment & Plan:  Flu shot given at visit

## 2023-05-17 NOTE — Assessment & Plan Note (Signed)
Checking lipid pane and adjust crestor as needed.

## 2023-05-17 NOTE — Assessment & Plan Note (Signed)
Checking CBC and ferritin after iron infusion about 1 month ago.

## 2023-05-17 NOTE — Assessment & Plan Note (Signed)
Checking HGA1c and previously controlled on janumet 50/1000 mg BID. Adjust as needed. On statin and had missed filling lisinopril she will do so.

## 2023-05-27 ENCOUNTER — Other Ambulatory Visit: Payer: Self-pay | Admitting: Internal Medicine

## 2023-05-28 ENCOUNTER — Other Ambulatory Visit: Payer: Self-pay | Admitting: Internal Medicine

## 2023-05-29 ENCOUNTER — Ambulatory Visit (INDEPENDENT_AMBULATORY_CARE_PROVIDER_SITE_OTHER): Payer: PPO | Admitting: Podiatry

## 2023-05-29 DIAGNOSIS — B351 Tinea unguium: Secondary | ICD-10-CM | POA: Diagnosis not present

## 2023-05-29 DIAGNOSIS — E1142 Type 2 diabetes mellitus with diabetic polyneuropathy: Secondary | ICD-10-CM

## 2023-05-29 DIAGNOSIS — M79676 Pain in unspecified toe(s): Secondary | ICD-10-CM | POA: Diagnosis not present

## 2023-05-29 DIAGNOSIS — M79675 Pain in left toe(s): Secondary | ICD-10-CM | POA: Diagnosis not present

## 2023-05-29 DIAGNOSIS — M79674 Pain in right toe(s): Secondary | ICD-10-CM | POA: Diagnosis not present

## 2023-05-29 NOTE — Patient Instructions (Signed)
Annual Diabetic foot exam due November 26, 2023.

## 2023-05-30 ENCOUNTER — Encounter: Payer: Self-pay | Admitting: Internal Medicine

## 2023-05-30 ENCOUNTER — Telehealth: Payer: Self-pay | Admitting: Internal Medicine

## 2023-05-30 NOTE — Telephone Encounter (Signed)
Prescription Request  05/30/2023  LOV: 05/17/2023  What is the name of the medication or equipment? HYDROcodone-acetaminophen (NORCO/VICODIN) 5-325 MG tablet   Have you contacted your pharmacy to request a refill? Yes   Which pharmacy would you like this sent to?   [facility] [place], [place] - [address] [place] [address] Phone: [phone] Fax: [phone]    Patient notified that their request is being sent to the clinical staff for review and that they should receive a response within 2 business days.   Please advise at Mobile [phone] (mobile)

## 2023-05-31 NOTE — Telephone Encounter (Signed)
This is no longer a chronic medication I have not filled since May so visit would be needed to discuss if this is appropriate ongoing can be virtual.

## 2023-06-03 ENCOUNTER — Ambulatory Visit: Payer: PPO | Admitting: Podiatry

## 2023-06-03 ENCOUNTER — Encounter: Payer: Self-pay | Admitting: Internal Medicine

## 2023-06-03 ENCOUNTER — Telehealth (INDEPENDENT_AMBULATORY_CARE_PROVIDER_SITE_OTHER): Payer: PPO | Admitting: Internal Medicine

## 2023-06-03 DIAGNOSIS — F1120 Opioid dependence, uncomplicated: Secondary | ICD-10-CM | POA: Diagnosis not present

## 2023-06-03 MED ORDER — HYDROCODONE-ACETAMINOPHEN 5-325 MG PO TABS: 1.0000 | ORAL_TABLET | Freq: Every day | ORAL | 0 refills | Status: DC | PRN

## 2023-06-03 NOTE — Telephone Encounter (Signed)
Pharmacy is correct in patients chart

## 2023-06-03 NOTE — Assessment & Plan Note (Signed)
She is taking hydrocodone/apap 5/325 1 pill daily per patient report. Fill pattern does not match but she thinks she could have had some leftover from filling previously #60 per month which she did not use all of. Refilled today for #30 and will adjust contract to #30 per month for ongoing usage.

## 2023-06-03 NOTE — Progress Notes (Signed)
Virtual Visit via Video Note  I connected with [name] on 06/03/23 at  8:40 AM EDT by a video enabled telemedicine application and verified that I am speaking with the correct person using two identifiers.  The patient and the provider were at separate locations throughout the entire encounter. Patient location: home, Provider location: work   I discussed the limitations of evaluation and management by telemedicine and the availability of in person appointments. The patient expressed understanding and agreed to proceed. The patient and the provider were the only parties present for the visit unless noted in HPI below.  History of Present Illness: The patient is a 72 y.o. female with visit for pain management. Previously was on contract for #60 hydrocodone per month. She then had several orthopedic surgeries and did not fill pain meds with [place] regularly. Last filled by PCP May 2024, prior to that Jan 2024. Filled by another office July 2024. Filled by another provider in the office August 2024. She is on concurrent benzos which dosing stable lorazepam 0.5 mg BID. She has been taking 1 pill at night time for most of this year. Denies side effects and sleeps much better with the medicine.   Observations/Objective: Appearance: normal, breathing appears normal, mental status is A and O times 3  Assessment and Plan: See problem oriented charting  Follow Up Instructions: will adjust rx to reflect current usage of 1 pill daily and refill hydrocodone/apap 5/325 #30 per month  I discussed the assessment and treatment plan with the patient. The patient was provided an opportunity to ask questions and all were answered. The patient agreed with the plan and demonstrated an understanding of the instructions.   The patient was advised to call back or seek an in-person evaluation if the symptoms worsen or if the condition fails to improve as anticipated.  Myrlene Broker, MD

## 2023-06-03 NOTE — Telephone Encounter (Signed)
Dondra Prader will be getting this patient scheduled for future refills

## 2023-06-03 NOTE — Telephone Encounter (Signed)
Pt was addressed today via my chart for medication closing this encounter

## 2023-06-04 ENCOUNTER — Encounter: Payer: Self-pay | Admitting: Podiatry

## 2023-06-04 DIAGNOSIS — B351 Tinea unguium: Secondary | ICD-10-CM | POA: Insufficient documentation

## 2023-06-04 NOTE — Progress Notes (Signed)
Subjective:  Patient ID: [name], female    DOB: [birth year],  MRN: [id number]  [name] presents to clinic today for: painful thick toenails that are difficult to trim. Pain interferes with ambulation. Aggravating factors include wearing enclosed shoe gear. Pain is relieved with periodic professional debridement. Patient is refusing treatment of right foot on today stating she is planning to schedule toenails removed permanently. Chief Complaint  Patient presents with   Diabetes    [facility]- BS-didn't check it this morning A1C- 5.9 10/24 PCP- Seen last week Pt refuses to take off one shoe she doesn't want them done due to pain from ingrown toe nails on second and third toe I was able to see her right foot but refused soak or treatment at this time.    PCP is Myrlene Broker, MD.  Allergies  Allergen Reactions   Benadryl [Diphenhydramine Hcl] Other (See Comments)    Restless leg   Clopidogrel Bisulfate     Other reaction(s): stomach upset   Ioversol Other (See Comments)    Unknown   Benadryl [Diphenhydramine] Anxiety   Iodinated Contrast Media Palpitations and Rash    Rapid heart rate, hot Other reaction(s): rapid heartbeat  Other reaction(s): flushing    Review of Systems: Negative except as noted in the HPI.  Objective: No changes noted in today's physical examination. There were no vitals filed for this visit.  [name] is a pleasant 72 y.o. female in NAD. AAO x 3.  Vascular Examination: Capillary refill time <3 seconds b/l LE. Palpable pedal pulses b/l LE. Digital hair present b/l. No pedal edema b/l. Skin temperature gradient WNL b/l. No varicosities b/l. [name]  Dermatological Examination: Pedal skin with normal turgor, texture and tone b/l. No open wounds. No interdigital macerations b/l. Toenails 2-4  left foot thickened, discolored, dystrophic with subungual debris. There is pain on palpation to dorsal aspect of nailplates. No corns, calluses nor  porokeratotic lesions noted..  Neurological Examination: Protective sensation intact with 10 gram monofilament b/l LE. Vibratory sensation intact b/l LE. Pt has subjective symptoms of neuropathy.  Musculoskeletal Examination: 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     Latest Ref Rng & Units 05/17/2023    3:22 PM 12/17/2022    3:22 PM 06/20/2022    3:04 PM  Hemoglobin A1C  Hemoglobin-A1c 4.6 - 6.5 % 5.9  7.3  6.8    Assessment/Plan: 1. Pain due to onychomycosis of toenail   2. Diabetic polyneuropathy associated with type 2 diabetes mellitus (HCC)     -Patient was evaluated and treated. All patient's and/or POA's questions/concerns answered on today's visit. -Continue foot and shoe inspections daily. Monitor blood glucose per PCP/Endocrinologist's recommendations. -Continue supportive shoe gear daily. -Mycotic toenails L 2nd toe, L 3rd toe, and L 4th toe were debrided in length and girth with sterile nail nippers and dremel without iatrogenic bleeding. -Patient/POA to call should there be question/concern in the interim.   Return in about 1 year (around 05/28/2024).  [name], DPM

## 2023-06-29 ENCOUNTER — Other Ambulatory Visit: Payer: Self-pay | Admitting: Cardiovascular Disease

## 2023-07-02 ENCOUNTER — Other Ambulatory Visit: Payer: Self-pay | Admitting: Cardiovascular Disease

## 2023-07-05 ENCOUNTER — Encounter: Payer: Self-pay | Admitting: Internal Medicine

## 2023-07-05 ENCOUNTER — Encounter: Payer: Self-pay | Admitting: Pharmacist

## 2023-07-05 MED ORDER — HYDROCODONE-ACETAMINOPHEN 5-325 MG PO TABS: 1.0000 | ORAL_TABLET | Freq: Every day | ORAL | 0 refills | Status: DC | PRN

## 2023-07-05 NOTE — Progress Notes (Signed)
Pharmacy Quality Measure Review  This patient is appearing on a report for being at risk of failing the adherence measure for diabetes medications this calendar year.   Medication: Janumet 50/1000 mg Last fill date: 06/02/23 for 30 day supply  Insurance report was not up to date. No action needed at this time.   Arbutus Leas, PharmD, BCPS Clinical Pharmacist [facility] [phone]

## 2023-07-07 ENCOUNTER — Encounter: Payer: Self-pay | Admitting: Cardiovascular Disease

## 2023-07-08 MED ORDER — DILTIAZEM HCL ER COATED BEADS 180 MG PO CP24: 180.0000 mg | ORAL_CAPSULE | Freq: Every day | ORAL | 3 refills | Status: DC

## 2023-07-16 ENCOUNTER — Encounter: Payer: Self-pay | Admitting: Internal Medicine

## 2023-07-16 ENCOUNTER — Ambulatory Visit (INDEPENDENT_AMBULATORY_CARE_PROVIDER_SITE_OTHER): Payer: PPO | Admitting: Internal Medicine

## 2023-07-16 VITALS — BP 156/68 | HR 89 | Ht 62.0 in | Wt 173.3 lb

## 2023-07-16 DIAGNOSIS — F1120 Opioid dependence, uncomplicated: Secondary | ICD-10-CM

## 2023-07-16 NOTE — Progress Notes (Unsigned)
   Subjective:   Patient ID: [name], female    DOB: 11/27/1950, 72 y.o.   MRN: [id number]  HPI The patient is a 72 YO female coming in for pain management. Is requesting change to amount of pain medication. Historically she had used up to #60 per month prior to joint surgeries. After surgery she as using this daily consistently so we did adjust our contract with her. She then in the last month has had more pain due to cold weather and herself starting taking 1-2 per day. She also had dental extraction and did fill and take pain medication related to that. She does also take chronic lorazepam which is stable dosing and she is careful to space dosing.   Review of Systems  Constitutional:  Positive for activity change and fatigue.  HENT: Negative.    Eyes: Negative.   Respiratory:  Positive for shortness of breath. Negative for cough and chest tightness.   Cardiovascular:  Negative for chest pain, palpitations and leg swelling.  Gastrointestinal:  Negative for abdominal distention, abdominal pain, constipation, diarrhea, nausea and vomiting.  Musculoskeletal:  Positive for arthralgias and myalgias.  Skin: Negative.   Neurological: Negative.   Psychiatric/Behavioral: Negative.      Objective:  Physical Exam Constitutional:      Appearance: She is well-developed.  HENT:     Head: Normocephalic and atraumatic.  Cardiovascular:     Rate and Rhythm: Normal rate. Rhythm irregular.  Pulmonary:     Effort: Pulmonary effort is normal. No respiratory distress.     Breath sounds: Normal breath sounds. No wheezing or rales.  Abdominal:     General: Bowel sounds are normal. There is no distension.     Palpations: Abdomen is soft.     Tenderness: There is no abdominal tenderness. There is no rebound.  Musculoskeletal:        General: Tenderness present.     Cervical back: Normal range of motion.  Skin:    General: Skin is warm and dry.  Neurological:     Mental Status: She is alert and  oriented to person, place, and time.     Coordination: Coordination abnormal.     Vitals:   07/16/23 1442 07/16/23 1451  BP: (!) 156/68 (!) 156/68  Pulse: 89   TempSrc: Oral   SpO2: 97%   Weight: 173 lb 5 oz (78.6 kg)   Height: 5\' 2"  (1.575 m)     Assessment & Plan:  Visit time 15 minutes in face to face communication with patient and coordination of care, additional 7 minutes spent in record review, coordination or care, ordering tests, communicating/referring to other healthcare professionals, documenting in medical records all on the same day of the visit for total time 22 minutes spent on the visit.

## 2023-07-16 NOTE — Patient Instructions (Addendum)
We will plan to keep the hydrocodone to #45 during the winter months.

## 2023-07-17 NOTE — Assessment & Plan Note (Signed)
We have agreed to go up to #45 per month for winter months only. We did explain why it is not okay to change how she takes her medication without advice and approval first from [place]. Explained that her lorazepam and hydrocodone make her more likely for accidental overdose and death and this is why we are cautious with changing dose of medication. Reviewed [place] database and appropriate with additional fill from dental procedure.

## 2023-08-24 NOTE — Progress Notes (Deleted)
 HPI F former smoker last seen in 2020 for COPD with concern of possible DVT, complicated by DM2/ neuropathy, AFib, CHF,HBP, CAD/PCI, COPD, GERD, Hyperlipidemia, Restless Legs, Obesity, Glaucoma Hosp in June, 2020 with pericardial tamponade, cardiogenic shock, CHF. Echo 11/20/2018- EF 55-60%, mild RAE Walk Test 12/21/19- room air- lowest O2 sat 97%, max HR 126. NPSG 08/15/2017- AHI 0/ hr. desat to 89%/ mean 93%, PLMA 23.7/ hr, body weight 194 lbs ONOX 89% or less  For over 17 minutes ([employer]) PFT 09/30/20--Minimal obstruction (refused bronchodilator trial), possible min restriction, DLCO mild to mod reduction PFT 09/30/20--Minimal obstruction (refused bronchodilator trial), possible min restriction, DLCO mild to mod reduction ONOX 09/04/20 RA- Did not qualify- minimum O2 sat 90% ------------------------------------------------------------------------------   08/23/22-  71yoF former smoker followed  for COPD , complicated by DM2/ neuropathy, AFib/ Eliquis ,, CHF,HBP, CAD/PCI, GERD, Hyperlipidemia, Restless Legs(Dr Dohmeier), Obesity, Glaucoma, Hx DVT, GIB/ Anemia,  Sleeping in recliner- hip won't let her get in and out of her bed. - Anoro Body weight today- Covid vax- 4 Phizers   Flu vax-Had Hosp 12/15-12/17/23- Pneumonia/ sepsis >Vantin , Zith. Covid infection early January > Paxlovid -----Pt had pneumonia in December but states she is much better now  Probably at baseline now with breathing improved after pneumonia.  Minimal cough and little wheeze now.  Continues Anoro inhaler.  She asked about pneumonia vaccine and we discussed timing.  08/26/23- 72yoF former smoker followed  for COPD , complicated by DM2/ neuropathy, AFib/ Eliquis ,, CHF,HBP, CAD/PCI, GERD, Hyperlipidemia, Restless Legs(Dr Dohmeier), Obesity, Glaucoma, Hx DVT, GIB/ Anemia,  Sleeping in recliner- hip won't let her get in and out of her bed. - Anoro Body weight today-  CXR 12/17/22 IMPRESSION: Chronic bronchitic markings. No acute  findings.    ROS-see HPI   + = positive Constitutional:    weight loss+ last year, night sweats, fevers, chills, fatigue, lassitude. HEENT:    headaches, difficulty swallowing, tooth/dental problems, sore throat,       sneezing, itching, ear ache, nasal congestion, post nasal drip, snoring CV:    chest pain, orthopnea, PND, +swelling in lower extremities, anasarca,                                   dizziness, +palpitations Resp:   +shortness of breath with exertion or at rest.                productive cough,   +non-productive cough, coughing up of blood.              change in color of mucus.  wheezing.   Skin:    rash or lesions. GI:  No-   heartburn, indigestion, abdominal pain, nausea, vomiting, diarrhea,                 change in bowel habits, lo+ss of appetite GU: dysuria, change in color of urine, no urgency or frequency.   flank pain. MS:   joint pain, stiffness, decreased range of motion, back pain. Neuro-     nothing unusual Psych:  change in mood or affect.  depression or anxiety.   memory loss.  OBJ- Physical Exam General- Alert, Oriented, Affect-appropriate, Distress- none acute, obese+,  Skin- rash-none, lesions- none, excoriation- none Lymphadenopathy- none Head- atraumatic            Eyes- Gross vision intact, PERRLA, conjunctivae and secretions clear  Ears- Hearing, canals-normal            Nose- Clear, no-Septal dev, mucus, polyps, erosion, perforation             Throat- Mallampati II , mucosa clear , drainage- none, tonsils- atrophic Neck- flexible , trachea midline, no stridor , thyroid  nl, carotid no bruit Chest - symmetrical excursion , unlabored           Heart/CV- IRR/AFib+ /Slow- almost regular, no murmur , no gallop  , no rub, nl s1 s2                           - JVD- none , edema- none, stasis changes+, varices-+ superficial           Lung- clear to P&A/ no rales, wheeze- none, cough- none , dullness-none, rub- none           Chest wall-  Abd-   Br/ Gen/ Rectal- Not done, not indicated Extrem-      +rolling walker  Neuro- grossly intact to observation

## 2023-08-26 ENCOUNTER — Other Ambulatory Visit: Payer: Self-pay | Admitting: Internal Medicine

## 2023-08-26 ENCOUNTER — Ambulatory Visit: Payer: HMO | Admitting: Internal Medicine

## 2023-08-28 ENCOUNTER — Ambulatory Visit: Payer: PPO | Admitting: Podiatry

## 2023-09-11 ENCOUNTER — Ambulatory Visit (INDEPENDENT_AMBULATORY_CARE_PROVIDER_SITE_OTHER): Payer: PPO | Admitting: Podiatry

## 2023-09-11 DIAGNOSIS — Z91199 Patient's noncompliance with other medical treatment and regimen due to unspecified reason: Secondary | ICD-10-CM

## 2023-09-13 ENCOUNTER — Ambulatory Visit: Payer: Self-pay | Admitting: Internal Medicine

## 2023-09-13 NOTE — Progress Notes (Signed)
 1. No-show for appointment

## 2023-09-13 NOTE — Telephone Encounter (Signed)
Copied from CRM [id number]. Topic: Clinical - Red Word Triage >> Sep 13, 2023  2:49 PM Alvino Blood C wrote: Red Word that prompted transfer to Nurse Triage: Patient has a rash on her inner right thigh that is causing some pain and irritation    Chief Complaint: Boils/bumps on R leg and thigh Symptoms: boils, pain  Frequency: Ongoing for one week Pertinent Negatives: Patient denies drainage, fever Disposition: [] ED /[] Urgent Care (no appt availability in office) / [] Appointment(In office/virtual)/ []  [facility]/ [] Home Care/ [] Refused Recommended Disposition /[] [facility]/ []  Follow-up with PCP Additional Notes: Patent stated she has 5-8 boils on her right thigh and leg. There is some pain and redness. Patient has been applying warm compresses and it helps, but she feels like the boils are worse now then they were initially. She first noticed the boils one week ago. Patient has appointment on 2/3.    Reason for Disposition  [1] Boil AND [2] not improved > 3 days following Care Advice  Answer Assessment - Initial Assessment Questions 1. APPEARANCE of BOIL: "What does the boil look like?"      Bumps, redness  2. LOCATION: "Where is the boil located?"      R leg and thigh  3. NUMBER: "How many boils are there?"      5-8  4. SIZE: "How big is the boil?" (e.g., inches, cm; compare to size of a coin or other object)     Dime sized, some nickel sized   5. ONSET: "When did the boil start?"     1 week ago, getting worse now  6. PAIN: "Is there any pain?" If Yes, ask: "How bad is the pain?"   (Scale 1-10; or mild, moderate, severe)     7/8 when walking, Less pain when staying still  7. FEVER: "Do you have a fever?" If Yes, ask: "What is it, how was it measured, and when did it start?"      No  8. SOURCE: "Have you been around anyone with boils or other Staph infections?" "Have you ever had boils before?"     Unknown source. Patient had boils before (30 years  ago)  Protocols used: Boil (Skin Abscess)-A-AH

## 2023-09-16 ENCOUNTER — Encounter: Payer: Self-pay | Admitting: Internal Medicine

## 2023-09-16 ENCOUNTER — Ambulatory Visit (INDEPENDENT_AMBULATORY_CARE_PROVIDER_SITE_OTHER): Payer: PPO | Admitting: Internal Medicine

## 2023-09-16 VITALS — BP 110/68 | HR 64 | Temp 98.5°F | Ht 62.0 in | Wt 180.0 lb

## 2023-09-16 DIAGNOSIS — I10 Essential (primary) hypertension: Secondary | ICD-10-CM

## 2023-09-16 DIAGNOSIS — D500 Iron deficiency anemia secondary to blood loss (chronic): Secondary | ICD-10-CM

## 2023-09-16 DIAGNOSIS — Z7984 Long term (current) use of oral hypoglycemic drugs: Secondary | ICD-10-CM | POA: Diagnosis not present

## 2023-09-16 DIAGNOSIS — E1169 Type 2 diabetes mellitus with other specified complication: Secondary | ICD-10-CM | POA: Diagnosis not present

## 2023-09-16 DIAGNOSIS — L0291 Cutaneous abscess, unspecified: Secondary | ICD-10-CM

## 2023-09-16 DIAGNOSIS — R300 Dysuria: Secondary | ICD-10-CM

## 2023-09-16 LAB — COMPREHENSIVE METABOLIC PANEL
ALT: 12 U/L (ref 0–35)
AST: 21 U/L (ref 0–37)
Albumin: 4 g/dL (ref 3.5–5.2)
Alkaline Phosphatase: 57 U/L (ref 39–117)
BUN: 15 mg/dL (ref 6–23)
CO2: 24 meq/L (ref 19–32)
Calcium: 8.6 mg/dL (ref 8.4–10.5)
Chloride: 97 meq/L (ref 96–112)
Creatinine, Ser: 0.91 mg/dL (ref 0.40–1.20)
GFR: 61.98 mL/min (ref >60.00)
Glucose, Bld: 196 mg/dL — ABNORMAL HIGH (ref 70–99)
Potassium: 4.6 meq/L (ref 3.5–5.1)
Sodium: 127 meq/L — ABNORMAL LOW (ref 135–145)
Total Bilirubin: 0.4 mg/dL (ref 0.2–1.2)
Total Protein: 7.1 g/dL (ref 6.0–8.3)

## 2023-09-16 LAB — CBC
HCT: 33.6 % — ABNORMAL LOW (ref 36.0–46.0)
Hemoglobin: 11.6 g/dL — ABNORMAL LOW (ref 12.0–15.0)
MCHC: 34.4 g/dL (ref 30.0–36.0)
MCV: 91.7 fL (ref 78.0–100.0)
Platelets: 210 10*3/uL (ref 150.0–400.0)
RBC: 3.66 Mil/uL — ABNORMAL LOW (ref 3.87–5.11)
RDW: 12.9 % (ref 11.5–15.5)
WBC: 8.4 10*3/uL (ref 4.0–10.5)

## 2023-09-16 LAB — FERRITIN: Ferritin: 33 ng/mL (ref 10.0–291.0)

## 2023-09-16 MED ORDER — SULFAMETHOXAZOLE-TRIMETHOPRIM 800-160 MG PO TABS: 1.0000 | ORAL_TABLET | Freq: Two times a day (BID) | ORAL | 0 refills | Status: DC

## 2023-09-16 NOTE — Patient Instructions (Addendum)
We have sent in bactrim to take 1 pill twice a day for 7 days.

## 2023-09-16 NOTE — Progress Notes (Unsigned)
   Subjective:   Patient ID: [name], female    DOB: 06/06/1951, 73 y.o.   MRN: [id number]  HPI The patient is a 73 YO female coming in for wound/boil on the right leg/thigh for about 4 days. Using heat compresses and this is helping maybe 20%. Also having possible UTI symptoms   Review of Systems  Constitutional:  Positive for activity change and fatigue.  HENT: Negative.    Eyes: Negative.   Respiratory:  Negative for cough, chest tightness and shortness of breath.   Cardiovascular:  Negative for chest pain, palpitations and leg swelling.  Gastrointestinal:  Negative for abdominal distention, abdominal pain, constipation, diarrhea, nausea and vomiting.  Musculoskeletal: Negative.   Skin:  Positive for wound.  Neurological: Negative.   Psychiatric/Behavioral: Negative.      Objective:  Physical Exam Constitutional:      Appearance: She is well-developed.  HENT:     Head: Normocephalic and atraumatic.  Cardiovascular:     Rate and Rhythm: Normal rate and regular rhythm.  Pulmonary:     Effort: Pulmonary effort is normal. No respiratory distress.     Breath sounds: Normal breath sounds. No wheezing or rales.  Abdominal:     General: Bowel sounds are normal. There is no distension.     Palpations: Abdomen is soft.     Tenderness: There is no abdominal tenderness. There is no rebound.  Musculoskeletal:     Cervical back: Normal range of motion.  Skin:    General: Skin is warm and dry.     Comments: Abscess right thigh inguinal region <1-2 cm several and not amenable for I and D.   Neurological:     Mental Status: She is alert and oriented to person, place, and time.     Coordination: Coordination abnormal.     Comments: Walker with wheels     Vitals:   09/16/23 1528  BP: 110/68  Pulse: 64  Temp: 98.5 F (36.9 C)  SpO2: 95%  Weight: 180 lb (81.6 kg)  Height: 5\' 2"  (1.575 m)    Assessment & Plan:

## 2023-09-17 ENCOUNTER — Encounter: Payer: Self-pay | Admitting: Internal Medicine

## 2023-09-17 ENCOUNTER — Telehealth: Payer: Self-pay | Admitting: Internal Medicine

## 2023-09-17 NOTE — Telephone Encounter (Signed)
 FYI. Please advise.

## 2023-09-17 NOTE — Telephone Encounter (Signed)
Please advise her to take medication

## 2023-09-17 NOTE — Telephone Encounter (Signed)
 Copied from CRM [id number]. Topic: Clinical - Prescription Issue >> Sep 16, 2023  4:22 PM Rolin D wrote: Reason for CRM: [facility] pharmacy called stating that patient has been prescribed sulfamethoxazole -trimethoprim  (BACTRIM  DS) 800-160 MG tablet . Pharmacy wanted to make Dr.Crawford aware that the medication can interact with patient blood pressure . Please contact pharmacy

## 2023-09-18 DIAGNOSIS — R300 Dysuria: Secondary | ICD-10-CM | POA: Insufficient documentation

## 2023-09-18 DIAGNOSIS — L0291 Cutaneous abscess, unspecified: Secondary | ICD-10-CM | POA: Insufficient documentation

## 2023-09-18 LAB — MICROALBUMIN / CREATININE URINE RATIO
Creatinine,U: 85.7 mg/dL
Microalb Creat Ratio: 142.2 mg/g — ABNORMAL HIGH (ref 0.0–30.0)
Microalb, Ur: 121.8 mg/dL — ABNORMAL HIGH (ref 0.0–1.9)

## 2023-09-18 NOTE — Assessment & Plan Note (Signed)
 BP at goal today on torsemide  and metoprolol  and lisinopril  and diltiazem . Checking CMP today and adjust as needed.

## 2023-09-18 NOTE — Assessment & Plan Note (Signed)
 U/A ordered. She is taking bactrim  for abscess which will cover for UTI so no additional antibiotics to be ordered.

## 2023-09-18 NOTE — Assessment & Plan Note (Signed)
 Checking microalbumin to creatinine ratio as due.

## 2023-09-18 NOTE — Assessment & Plan Note (Signed)
 Needs follow up CBC and ferritin as she has not had for a few months and tends to drift downwards.

## 2023-09-18 NOTE — Assessment & Plan Note (Signed)
 Inguinal region multiple small. Not amenable to I and D. Rx bactrim  1 week. Continue using heat to the area.

## 2023-09-19 ENCOUNTER — Other Ambulatory Visit: Payer: Self-pay | Admitting: Internal Medicine

## 2023-09-19 LAB — URINALYSIS, ROUTINE W REFLEX MICROSCOPIC
Bilirubin Urine: NEGATIVE
Hgb urine dipstick: NEGATIVE
Ketones, ur: NEGATIVE
Nitrite: NEGATIVE
Specific Gravity, Urine: 1.015 (ref 1.000–1.030)
Total Protein, Urine: >=300 — AB
Urine Glucose: NEGATIVE
Urobilinogen, UA: 0.2 (ref 0.0–1.0)
pH: 6 (ref 5.0–8.0)

## 2023-09-23 ENCOUNTER — Other Ambulatory Visit: Payer: Self-pay | Admitting: Internal Medicine

## 2023-09-28 DIAGNOSIS — B029 Zoster without complications: Secondary | ICD-10-CM | POA: Diagnosis not present

## 2023-10-03 ENCOUNTER — Other Ambulatory Visit: Payer: Self-pay | Admitting: Internal Medicine

## 2023-10-05 ENCOUNTER — Other Ambulatory Visit: Payer: Self-pay | Admitting: Internal Medicine

## 2023-10-08 DIAGNOSIS — B029 Zoster without complications: Secondary | ICD-10-CM | POA: Diagnosis not present

## 2023-10-09 ENCOUNTER — Telehealth: Payer: PPO | Admitting: Internal Medicine

## 2023-10-29 ENCOUNTER — Encounter: Payer: Self-pay | Admitting: Internal Medicine

## 2023-10-31 ENCOUNTER — Other Ambulatory Visit: Payer: Self-pay | Admitting: Physician Assistant

## 2023-11-03 ENCOUNTER — Other Ambulatory Visit: Payer: Self-pay | Admitting: Internal Medicine

## 2023-11-04 NOTE — Telephone Encounter (Signed)
 LOV: 09/16/23 Last fill: 08/27/23, 45 tablet 0 refill

## 2023-11-04 NOTE — Telephone Encounter (Signed)
LM for pt to return call regarding rx.  

## 2023-11-06 ENCOUNTER — Other Ambulatory Visit: Payer: Self-pay | Admitting: Internal Medicine

## 2023-11-07 ENCOUNTER — Encounter: Payer: Self-pay | Admitting: Internal Medicine

## 2023-11-07 MED ORDER — GABAPENTIN 100 MG PO CAPS: 100.0000 mg | ORAL_CAPSULE | Freq: Two times a day (BID) | ORAL | 3 refills | Status: DC

## 2023-11-07 NOTE — Addendum Note (Signed)
 Addended by: Myrlene Broker on: 11/07/2023 09:27 AM   Modules accepted: Orders

## 2023-11-07 NOTE — Telephone Encounter (Signed)
 Patient has verified dosage and how she takes the medication. OK to send in to the patient

## 2023-11-07 NOTE — Telephone Encounter (Signed)
 See note from prior refill request we need more information about this

## 2023-11-11 DIAGNOSIS — H2513 Age-related nuclear cataract, bilateral: Secondary | ICD-10-CM | POA: Diagnosis not present

## 2023-11-11 DIAGNOSIS — H401131 Primary open-angle glaucoma, bilateral, mild stage: Secondary | ICD-10-CM | POA: Diagnosis not present

## 2023-11-11 DIAGNOSIS — H52203 Unspecified astigmatism, bilateral: Secondary | ICD-10-CM | POA: Diagnosis not present

## 2023-11-11 LAB — HM DIABETES EYE EXAM

## 2023-11-18 ENCOUNTER — Other Ambulatory Visit: Payer: Self-pay | Admitting: Gastroenterology

## 2023-11-18 ENCOUNTER — Ambulatory Visit
Admission: RE | Admit: 2023-11-18 | Discharge: 2023-11-18 | Disposition: A | Discharge status: Home / Self Care | Source: Ambulatory Visit | Attending: Gastroenterology | Admitting: Gastroenterology

## 2023-11-18 DIAGNOSIS — R140 Abdominal distension (gaseous): Secondary | ICD-10-CM | POA: Diagnosis not present

## 2023-11-18 DIAGNOSIS — K5900 Constipation, unspecified: Secondary | ICD-10-CM

## 2023-11-26 DIAGNOSIS — M545 Low back pain, unspecified: Secondary | ICD-10-CM | POA: Diagnosis not present

## 2023-11-26 DIAGNOSIS — M7061 Trochanteric bursitis, right hip: Secondary | ICD-10-CM | POA: Diagnosis not present

## 2023-11-27 ENCOUNTER — Ambulatory Visit: Payer: Self-pay

## 2023-11-27 ENCOUNTER — Emergency Department (HOSPITAL_BASED_OUTPATIENT_CLINIC_OR_DEPARTMENT_OTHER)

## 2023-11-27 ENCOUNTER — Other Ambulatory Visit: Payer: Self-pay

## 2023-11-27 ENCOUNTER — Encounter (HOSPITAL_BASED_OUTPATIENT_CLINIC_OR_DEPARTMENT_OTHER): Payer: Self-pay

## 2023-11-27 ENCOUNTER — Emergency Department (HOSPITAL_BASED_OUTPATIENT_CLINIC_OR_DEPARTMENT_OTHER)
Admission: EM | Admit: 2023-11-27 | Discharge: 2023-11-27 | Disposition: A | Discharge status: Home / Self Care | Attending: Emergency Medicine | Admitting: Emergency Medicine

## 2023-11-27 DIAGNOSIS — W06XXXD Fall from bed, subsequent encounter: Secondary | ICD-10-CM | POA: Insufficient documentation

## 2023-11-27 DIAGNOSIS — I6523 Occlusion and stenosis of bilateral carotid arteries: Secondary | ICD-10-CM | POA: Diagnosis not present

## 2023-11-27 DIAGNOSIS — G9389 Other specified disorders of brain: Secondary | ICD-10-CM | POA: Diagnosis not present

## 2023-11-27 DIAGNOSIS — I509 Heart failure, unspecified: Secondary | ICD-10-CM | POA: Diagnosis not present

## 2023-11-27 DIAGNOSIS — I4891 Unspecified atrial fibrillation: Secondary | ICD-10-CM | POA: Diagnosis not present

## 2023-11-27 DIAGNOSIS — E119 Type 2 diabetes mellitus without complications: Secondary | ICD-10-CM | POA: Insufficient documentation

## 2023-11-27 DIAGNOSIS — S0990XA Unspecified injury of head, initial encounter: Secondary | ICD-10-CM | POA: Diagnosis not present

## 2023-11-27 DIAGNOSIS — S0003XA Contusion of scalp, initial encounter: Secondary | ICD-10-CM | POA: Diagnosis not present

## 2023-11-27 DIAGNOSIS — W19XXXD Unspecified fall, subsequent encounter: Secondary | ICD-10-CM

## 2023-11-27 DIAGNOSIS — X58XXXA Exposure to other specified factors, initial encounter: Secondary | ICD-10-CM | POA: Diagnosis not present

## 2023-11-27 DIAGNOSIS — Z23 Encounter for immunization: Secondary | ICD-10-CM | POA: Diagnosis not present

## 2023-11-27 DIAGNOSIS — S6991XA Unspecified injury of right wrist, hand and finger(s), initial encounter: Secondary | ICD-10-CM | POA: Diagnosis not present

## 2023-11-27 DIAGNOSIS — S61511A Laceration without foreign body of right wrist, initial encounter: Secondary | ICD-10-CM | POA: Insufficient documentation

## 2023-11-27 DIAGNOSIS — Z7901 Long term (current) use of anticoagulants: Secondary | ICD-10-CM | POA: Insufficient documentation

## 2023-11-27 DIAGNOSIS — J45909 Unspecified asthma, uncomplicated: Secondary | ICD-10-CM | POA: Insufficient documentation

## 2023-11-27 DIAGNOSIS — I110 Hypertensive heart disease with heart failure: Secondary | ICD-10-CM | POA: Diagnosis not present

## 2023-11-27 DIAGNOSIS — Z79899 Other long term (current) drug therapy: Secondary | ICD-10-CM | POA: Diagnosis not present

## 2023-11-27 DIAGNOSIS — S0083XA Contusion of other part of head, initial encounter: Secondary | ICD-10-CM | POA: Diagnosis not present

## 2023-11-27 DIAGNOSIS — J449 Chronic obstructive pulmonary disease, unspecified: Secondary | ICD-10-CM | POA: Diagnosis not present

## 2023-11-27 DIAGNOSIS — S60211A Contusion of right wrist, initial encounter: Secondary | ICD-10-CM | POA: Diagnosis not present

## 2023-11-27 DIAGNOSIS — S61501A Unspecified open wound of right wrist, initial encounter: Secondary | ICD-10-CM | POA: Diagnosis not present

## 2023-11-27 DIAGNOSIS — W19XXXA Unspecified fall, initial encounter: Secondary | ICD-10-CM | POA: Diagnosis not present

## 2023-11-27 MED ORDER — TETANUS-DIPHTH-ACELL PERTUSSIS 5-2.5-18.5 LF-MCG/0.5 IM SUSY
0.5000 mL | PREFILLED_SYRINGE | Freq: Once | INTRAMUSCULAR | Status: AC
  Administered 2023-11-27: 0.5 mL via INTRAMUSCULAR
  Filled 2023-11-27: qty 0.5

## 2023-11-27 NOTE — ED Provider Notes (Signed)
 [place] EMERGENCY DEPARTMENT AT [facility] Provider Note   CSN: [id number] Arrival date & time: 11/27/23  1844     History  Chief Complaint  Patient presents with   [name]    [name] is a 73 y.o. female.  Patient with history of afib on Eliquis, hypertension, hyperlipidemia presents today with complaints of mechanical fall.  She states this arm occurred at approximately 730 this morning when she lost her balance and fell getting out of bed. States that she fell mostly on her right wrist but did also hit her head. She did not loose consciousness. She does have a skin tear on her right wrist. She was initially seen at urgent care and they did an x-ray of the wrist which was negative and they cleaned and dressed her wound. They did not update her tetanus. Given she fell and hit her head on anticoagulation, they recommended she come here for a CT of her head. She presents for same. She notes that she does have a small bruise on the left side of her head, however this area only hurts if she presses on it. Denies headache otherwise. No vision changes, nausea, or vomiting. She has been walking since the fall without issue. Denies any other injuries or complaints.   The history is provided by the patient. No language interpreter was used.  Fall       Home Medications Prior to Admission medications   Medication Sig Start Date End Date Taking? Authorizing Provider  Accu-Chek Softclix Lancets lancets  05/17/23   Adelia Homestead, MD  albuterol (VENTOLIN HFA) 108 (90 Base) MCG/ACT inhaler Inhale 1 puff into the lungs every 6 (six) hours as needed for wheezing or shortness of breath.    [provider]  apixaban (ELIQUIS) 5 MG TABS tablet Take 1 tablet (5 mg total) by mouth 2 (two) times daily. 02/13/23   Croitoru, Mihai, MD  ascorbic acid (VITAMIN C) 1000 MG tablet Take 1,000 mg by mouth daily.    [provider]  benzonatate (TESSALON) 200 MG capsule Take 1  capsule (200 mg total) by mouth 3 (three) times daily as needed. 10/09/23   Adelia Homestead, MD  diltiazem (CARDIZEM CD) 180 MG 24 hr capsule Take 1 capsule (180 mg total) by mouth daily. 07/08/23   Croitoru, Mihai, MD  famotidine (PEPCID) 20 MG tablet Take 1 tablet (20 mg total) by mouth 2 (two) times daily. 04/23/23   Colene Dauphin, MD  gabapentin (NEURONTIN) 100 MG capsule Take 1 capsule (100 mg total) by mouth 2 (two) times daily. 11/07/23   Adelia Homestead, MD  glucose blood (FREESTYLE LITE) test strip Use as instructed 05/17/23   Adelia Homestead, MD  guaiFENesin (MUCINEX) 600 MG 12 hr tablet Take 600 mg by mouth 2 (two) times daily.    [provider]  HYDROcodone-acetaminophen (NORCO/VICODIN) 5-325 MG tablet Take 1 tablet by mouth daily as needed for moderate pain (pain score 4-6) (This is a 30 day supply). 11/06/23   Adelia Homestead, MD  latanoprost (XALATAN) 0.005 % ophthalmic solution Place 1 drop into both eyes at bedtime. 02/07/21   [provider]  lisinopril (ZESTRIL) 10 MG tablet Take 1 tablet (10 mg total) by mouth daily. 10/31/23   Croitoru, Mihai, MD  LORazepam (ATIVAN) 0.5 MG tablet Take 1 tablet (0.5 mg total) by mouth 2 (two) times daily. 09/23/23   Adelia Homestead, MD  metoprolol succinate (TOPROL-XL) 100 MG 24 hr  tablet Take 1 tablet (100 mg total) by mouth 2 (two) times daily. 05/17/23   Myrlene Broker, MD  metoprolol tartrate (LOPRESSOR) 25 MG tablet TAKE 1 TABLET ONCE DAILY AS NEEDED FOR RAPID PALPITATIONS. 04/02/23   Croitoru, Mihai, MD  Multiple Minerals-Vitamins (CALCIUM CITRATE PLUS/MAGNESIUM PO) Take 400 mg by mouth daily.    [provider]  nystatin (MYCOSTATIN/NYSTOP) powder Apply topically once a week. 05/17/23   Myrlene Broker, MD  nystatin ointment (MYCOSTATIN) Apply 1 Application topically 2 (two) times daily as needed (IRRITATION). 05/17/23   Myrlene Broker, MD  PARoxetine (PAXIL) 10 MG tablet  Take 2.5 tablets (25 mg total) by mouth daily. 05/17/23   Myrlene Broker, MD  rOPINIRole (REQUIP XL) 2 MG 24 hr tablet Take 1 tablet (2 mg total) by mouth at bedtime. 01/21/23   Dohmeier, Porfirio Mylar, MD  rOPINIRole (REQUIP) 0.5 MG tablet Take 1 tablet (0.5 mg total) by mouth at bedtime. 01/21/23   Dohmeier, Porfirio Mylar, MD  rosuvastatin (CRESTOR) 10 MG tablet TAKE ONE TABLET BY MOUTH AT BEDTIME. 04/25/23   Croitoru, Mihai, MD  senna (SENOKOT) 8.6 MG TABS Take 1 tablet by mouth at bedtime as needed for mild constipation.    [provider]  sitaGLIPtin-metformin (JANUMET) 50-1000 MG tablet Take 1 tablet by mouth 2 (two) times daily with a meal. 05/17/23   Myrlene Broker, MD  sulfamethoxazole-trimethoprim (BACTRIM DS) 800-160 MG tablet Take 1 tablet by mouth 2 (two) times daily. 09/16/23   Myrlene Broker, MD  torsemide (DEMADEX) 20 MG tablet Take 1 tablet (20 mg total) by mouth as needed (swelling and/or weight gain). 05/31/22   Azalee Course, PA  traZODone (DESYREL) 100 MG tablet Up to 200 mg at bedtime for sleep 01/21/23   Dohmeier, Porfirio Mylar, MD  triamcinolone cream (KENALOG) 0.1 % Apply 1 Application topically 2 (two) times daily. 05/17/23   Myrlene Broker, MD  umeclidinium-vilanterol [facility] ELLIPTA) 62.5-25 MCG/ACT AEPB Inhale 1 puff into the lungs daily. 04/27/23   Waymon Budge, MD      Allergies    Benadryl [diphenhydramine hcl], Clopidogrel bisulfate, Ioversol, Benadryl [diphenhydramine], and Iodinated contrast media    Review of Systems   Review of Systems  All other systems reviewed and are negative.   Physical Exam Updated Vital Signs BP (!) 158/69 (BP Location: Right Arm)   Pulse 81   Temp (!) 97.4 F (36.3 C) (Oral)   Resp 18   Ht 5\' 1"  (1.549 m)   Wt 82.6 kg   SpO2 94%   BMI 34.39 kg/m  Physical Exam Vitals and nursing note reviewed.  Constitutional:      General: She is not in acute distress.    Appearance: Normal appearance. She is normal weight. She  is not ill-appearing, toxic-appearing or diaphoretic.  HENT:     Head: Normocephalic and atraumatic.     Comments: No racoon eyes No battle sign  Very small superficial hematoma noted to the left parietal scalp.  No bleeding, crepitus, or wounds. Eyes:     Extraocular Movements: Extraocular movements intact.     Pupils: Pupils are equal, round, and reactive to light.  Cardiovascular:     Rate and Rhythm: Normal rate.     Comments: No tenderness to palpation of the anterior chest wall Pulmonary:     Effort: Pulmonary effort is normal. No respiratory distress.  Abdominal:     Comments: No abdominal tenderness or bruising  Musculoskeletal:  General: Normal range of motion.     Cervical back: Normal and normal range of motion.     Thoracic back: Normal.     Lumbar back: Normal.     Comments: No midline tenderness, no stepoffs or deformity noted on palpation of cervical, thoracic, and lumbar spine  Skin:    General: Skin is warm and dry.     Comments: Wrist wound wrapped with dressing from urgent care  Neurological:     General: No focal deficit present.     Mental Status: She is alert and oriented to person, place, and time.  Psychiatric:        Mood and Affect: Mood normal.        Behavior: Behavior normal.     ED Results / Procedures / Treatments   Labs (all labs ordered are listed, but only abnormal results are displayed) Labs Reviewed - No data to display  EKG None  Radiology No results found.  Procedures Procedures    Medications Ordered in ED Medications  Tdap (BOOSTRIX) injection 0.5 mL (has no administration in time range)    ED Course/ Medical Decision Making/ A&P Clinical Course as of 11/27/23 2049  Wed Nov 27, 2023  2034 GLF. CT Head wetread negative. NO acute distress.  [CC]    Clinical Course User Index [CC] Onetha Bile, MD                                 Medical Decision Making Amount and/or Complexity of Data  Reviewed Radiology: ordered.  Risk Prescription drug management.   This patient is a 73 y.o. female  who presents to the ED for concern of fall on Eliquis.   Differential diagnoses prior to evaluation: The emergent differential diagnosis includes, but is not limited to,  trauma . This is not an exhaustive differential.   Past Medical History / Co-morbidities / Social History:  has a past medical history of Anemia, unspecified, Arthritis, Asthma, Atrial fibrillation with RVR (HCC), Bell's palsy, CHF (congestive heart failure) (HCC), Chronic low back pain, COPD with asthma (HCC), Coronary artery disease, Diabetes mellitus, Early cataracts, bilateral, Fatty liver, GERD (gastroesophageal reflux disease), Glaucoma, Glaucoma, Goiter, Heart murmur, History of nuclear stress test (2012; 2014), Hyperlipidemia, Hypertension, Panic disorder, Pneumonia (2008), Polycystic ovary disease, Shortness of breath dyspnea, and Spinal stenosis.   Additional history: Chart reviewed. Pertinent results include: Seen at urgent care immediately prior to arrival today.  Had an x-ray of her wrist that was normal.  The abrasion on her wrist was cleaned and dressed there.  Sent here for CT of the head given fall on thinners.  Physical Exam: Physical exam performed. The pertinent findings include: Did not visualize the abrasion on her wrist as it is already been assessed, cleaned, and dressed at urgent care.  She does have a very small hematoma on her left parietal scalp without any wounds or crepitus.  Lab Tests/Imaging studies: CT imaging of the head ordered and obtained.  Myself and EDP Dr. Urban Garden evaluated the scan and did not see any concerning findings.  Given extended wait times for radiology reads, plan to discharge without official results.  Will follow in the meantime   Disposition: After consideration of the diagnostic results and the patients response to treatment, I feel that emergency department workup  does not suggest an emergent condition requiring admission or immediate intervention beyond what has been performed at this time. The  plan is: Discharge with close outpatient follow-up and return precautions.  As discussed above, EDP and I have evaluated her head CT and not seen any concerning findings.  Given extended wait times for radiology reads, discussed plan with patient who would prefer to be discharged without the official read.  Plan to follow these results, we will call her if anything out of the ordinary comes back.  Tdap updated. Evaluation and diagnostic testing in the emergency department does not suggest an emergent condition requiring admission or immediate intervention beyond what has been performed at this time.  Plan for discharge with close PCP follow-up.  Patient is understanding and amenable with plan, educated on red flag symptoms that would prompt immediate return.  Patient discharged in stable condition.  This is a shared visit with supervising physician Dr. Urban Garden who has independently evaluated patient & provided guidance in evaluation/management/disposition, in agreement with care   Final Clinical Impression(s) / ED Diagnoses Final diagnoses:  Fall, subsequent encounter    Rx / DC Orders ED Discharge Orders     None     An After Visit Summary was printed and given to the patient.     Margarine Grosshans A, PA-C 11/27/23 2055    Onetha Bile, MD 11/28/23 [phone]

## 2023-11-27 NOTE — Telephone Encounter (Signed)
 Chief Complaint: head strike Symptoms: fall, head strike on Eliquis Frequency: fell at 0715 today Pertinent Negatives: Patient denies nausea, vomiting, visual changes, weakness (per spouse) Disposition: [x] ED /[] Urgent Care (no appt availability in office) / [] Appointment(In office/virtual)/ []  [facility]/ [] Home Care/ [x] Refused Recommended Disposition /[] [facility]/ []  Follow-up with PCP Additional Notes: RN spoke with pt's spouse. Spouse states the pt's "feet got tangled up" this AM and the pt fell onto the laminate floor. Pt endorses hitting the back R side of her head. Pt takes Eliquis. Pt went to UC this AM and was evaluated. UC advised the pt should go to the ED for a further work-up and head CT. Spouse called in to arrange a head CT with Dr. Okey Dupre. Spouse states the pt is currently sleeping. Spouse states the pt has RLS and only got 1 hr of sleep tonight and is very tired. Per the spouse, the pt has had a difficult few years with many medical diagnoses. Spouse states the pt "takes a long time to express herself and will fall asleep mid-sentence." Spouse states that is is not a new problem or worse today than it usually is. Spouse reports pt had a headache and took a Tylenol and felt better by the time they arrived at [facility]. Pt is sleeping but spouse denies they have nausea, vomiting, blurry vision, worsening confusion, difficulty walking. RN advised spouse the pt needs to go to the ED since she fell and hit her head on blood thinners. Spouse states the pt needs to sleep and they would rather not wait in the [facility] today. RN educated the pt on why the ED is the best place to have the pt's symptoms addressed. Spouse verbalized understanding but states they likely won't go to the ED. RN advised that if the pt gets any worse they need to call 911. RN called [facility] to inform.    Copied from CRM [id number]. Topic: Clinical - Red Word Triage >> Nov 27, 2023 11:23 AM Ardeth Perfect H wrote: [employer] that prompted transfer to Nurse Triage: Patient's wife called regarding medical advice.She stated the patient fell and hit her head around 7:15 AM this morning, resulting in a scraped arm. The patient was taken to urgent care, where X-rays were completed and no broken bones were reported. However, urgent care recommended the patient have a head CT. The wife is requesting that Dr. Okey Dupre place an order for a head CT so the patient can have the imaging done as soon as possible. She also mentioned that the patient is currently experiencing a headache following the fall.   Spouse Callback Number [phone] [employer] [facility]) Reason for Disposition  Sounds like a serious injury to the triager    Head strike on Eliquis  Answer Assessment - Initial Assessment Questions 1. MECHANISM: "How did the injury happen?" For falls, ask: "What height did you fall from?" and "What surface did you fall against?"      Spouse states she did not see it - states pt's feet got tangled up and she fell on a laminate floor with a thin rug over top, lack of sleep d/t RLS contributed to fall 2. 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 NEUROLOGIC SYMPTOMS: "Was there any loss of consciousness?" "Are there any other neurological symptoms?"      No 4. MENTAL STATUS: "Does the person know who they are, who you are, and  where they are?"      Yes, per spouse  5. LOCATION: "What part of the head was hit?"      Per spouse, it was the R side of the pt's head and towards the back  6. SCALP APPEARANCE: "What does the scalp look like? Is it bleeding now?" If Yes, ask: "Is it difficult to stop?"      No 7. SIZE: For cuts, bruises, or swelling, ask: "How large is it?" (e.g., inches or centimeters)      R arm laceration - "broke the skin in one small place not even a 1/2-inch long, flap of skin pulled back", PA at UC cleaned the area. Despite Eliquis spouse states bleeding was not severe,  it only oozed blood. Spouse states "they  closed the flap with two steri--strips" 8. PAIN: "Is there any pain?" If Yes, ask: "How bad is it?"  (e.g., Scale 1-10; or mild, moderate, severe)     Headache, chronic hip pain 9. TETANUS: For any breaks in the skin, ask: "When was the last tetanus booster?"     Overdue since 1969 10. OTHER SYMPTOMS: "Do you have any other symptoms?" (e.g., neck pain, vomiting)       Pt was seen at UC this AM. Spouse states pt has RLS and only got one hour of sleep. Pt did not sleep all night. No LOC w/ fall. Takes Eliquis. UC worried about brain bleed d/t Eliquis. Spouse states pt has complained of a headache. Pt took tylenol with morning meds this AM and states she felt better by the time she got to UC. Per spouse pt is now sleeping.  Spouse states pt takes a lot of medication and "the last few years have been hell on Earth with all her diagnoses." Spouse states pt is often very tired. Spouse states the pt takes "a long time she express herself and she will fall asleep mid-sentence." (Per chart review pt was drowsy at [facility]). States this is not new. Spouse states the pt is currently oriented and denies that the pt is experiencing confusion, nausea, vomiting, difficulty walking  Protocols used: Head Injury-A-AH

## 2023-11-27 NOTE — ED Triage Notes (Signed)
 Pt reports falling at 0730 AM this morning in bedroom after getting feet tangled up. Pt reports striking head. Pt denies any LOC. Pt reports on elliquis. Pt CA&O x4. Pt sent by UC for a CT scan.

## 2023-11-27 NOTE — Discharge Instructions (Signed)
 As we discussed, Dr. Urban Garden and I have both evaluated the CT scan of your head and have not seen anything out of the ordinary.  Unfortunately, it is taking several hours for our radiologist to to release the official reads for these images and therefore we are discharging you prior to this official result.  If anything out of the ordinary results, you will receive a phone call with this information and instructions.  Otherwise, I recommend that you follow-up closely with your PCP.  Clean and dress your wound per urgent care's recommendations.  We did update your tetanus.  Return if development of any new or worsening symptoms.

## 2023-11-28 ENCOUNTER — Telehealth: Payer: Self-pay

## 2023-11-28 NOTE — Transitions of Care (Post Inpatient/ED Visit) (Signed)
   11/28/2023  Name: [name] MRN: [id number] DOB: October 14, 1950  Today's TOC FU Call Status: Today's TOC FU Call Status:: Unsuccessful Call (1st Attempt) Unsuccessful Call (1st Attempt) Date: 11/28/23  Attempted to reach the patient regarding the most recent Inpatient/ED visit.  Follow Up Plan: Additional outreach attempts will be made to reach the patient to complete the Transitions of Care (Post Inpatient/ED visit) call.   Signature Darrall Ellison, LPN [facility] Nurse Health Advisor Direct Dial [phone]

## 2023-11-29 NOTE — Transitions of Care (Post Inpatient/ED Visit) (Signed)
   11/29/2023  Name: [name] MRN: [id number] DOB: 03/14/1951  Today's TOC FU Call Status: Today's TOC FU Call Status:: Unsuccessful Call (2nd Attempt) Unsuccessful Call (1st Attempt) Date: 11/28/23 Unsuccessful Call (2nd Attempt) Date: 11/29/23  Attempted to reach the patient regarding the most recent Inpatient/ED visit.  Follow Up Plan: Additional outreach attempts will be made to reach the patient to complete the Transitions of Care (Post Inpatient/ED visit) call.   Signature Darrall Ellison, LPN [facility] Nurse Health Advisor Direct Dial [phone]

## 2023-12-03 DIAGNOSIS — M1611 Unilateral primary osteoarthritis, right hip: Secondary | ICD-10-CM | POA: Diagnosis not present

## 2023-12-03 NOTE — Transitions of Care (Post Inpatient/ED Visit) (Signed)
 12/03/2023  Name: [name] MRN: [id number] DOB: 07/02/1951  Today's TOC FU Call Status: Today's TOC FU Call Status:: Successful TOC FU Call Completed Unsuccessful Call (1st Attempt) Date: 11/28/23 Unsuccessful Call (2nd Attempt) Date: 11/29/23 [facility] FU Call Complete Date: 12/03/23 Patient's Name and Date of Birth confirmed.  Transition Care Management Follow-up Telephone Call Date of Discharge: 11/27/23 Discharge Facility: [facility] ([facility]) Type of Discharge: Emergency Department Reason for ED Visit: Other: (fall) How have you been since you were released from the hospital?: Better Any questions or concerns?: No  Items Reviewed: Did you receive and understand the discharge instructions provided?: Yes Medications obtained,verified, and reconciled?: Yes (Medications Reviewed) Any new allergies since your discharge?: No Dietary orders reviewed?: NA Do you have support at home?: Yes People in Home [RPT]: significant other  Medications Reviewed Today: Medications Reviewed Today     Reviewed by Darrall Ellison, LPN (Licensed Practical Nurse) on 12/03/23 at 1127  Med List Status: <None>   Medication Order Taking? Sig Documenting Provider Last Dose Status Informant  Accu-Chek Softclix Lancets lancets [id number] No  Adelia Homestead, MD Taking Active   albuterol  (VENTOLIN  HFA) 108 [phone] Base) MCG/ACT inhaler [id number] No Inhale 1 puff into the lungs every 6 (six) hours as needed for wheezing or shortness of breath. [provider] Taking Active Self  apixaban  (ELIQUIS ) 5 MG TABS tablet [id number] No Take 1 tablet (5 mg total) by mouth 2 (two) times daily. Croitoru, Mihai, MD Taking Active   ascorbic acid  (VITAMIN C ) 1000 MG tablet [id number] No Take 1,000 mg by mouth daily. [provider] Taking Active Self  benzonatate  (TESSALON ) 200 MG capsule [id number]  Take 1 capsule (200 mg total) by mouth 3 (three) times daily as needed. Adelia Homestead, MD   Active   diltiazem  (CARDIZEM  CD) 180 MG 24 hr capsule [id number] No Take 1 capsule (180 mg total) by mouth daily. Croitoru, Mihai, MD Taking Active   famotidine  (PEPCID ) 20 MG tablet [id number] No Take 1 tablet (20 mg total) by mouth 2 (two) times daily. Colene Dauphin, MD Taking Active   gabapentin  (NEURONTIN ) 100 MG capsule [id number]  Take 1 capsule (100 mg total) by mouth 2 (two) times daily. Adelia Homestead, MD  Active   glucose blood (FREESTYLE LITE) test strip [id number] No Use as instructed Adelia Homestead, MD Taking Active   guaiFENesin  (MUCINEX ) 600 MG 12 hr tablet [id number] No Take 600 mg by mouth 2 (two) times daily. [provider] Taking Active Self  HYDROcodone -acetaminophen  (NORCO/VICODIN) 5-325 MG tablet [id number]  Take 1 tablet by mouth daily as needed for moderate pain (pain score 4-6) (This is a 30 day supply). Adelia Homestead, MD  Active   latanoprost  (XALATAN ) 0.005 % ophthalmic solution [id number] No Place 1 drop into both eyes at bedtime. [provider] Taking Active Self  lisinopril  (ZESTRIL ) 10 MG tablet [id number]  Take 1 tablet (10 mg total) by mouth daily. Croitoru, Mihai, MD  Active   LORazepam  (ATIVAN ) 0.5 MG tablet [id number]  Take 1 tablet (0.5 mg total) by mouth 2 (two) times daily. Adelia Homestead, MD  Active   metoprolol  succinate (TOPROL -XL) 100 MG 24 hr tablet [id number] No Take 1 tablet (100 mg total) by mouth 2 (two) times daily. Adelia Homestead, MD Taking Active   metoprolol  tartrate (LOPRESSOR ) 25 MG tablet [id number] No TAKE 1 TABLET ONCE DAILY AS NEEDED FOR RAPID PALPITATIONS. Croitoru, Karyl Paget, MD Taking Active  Med Note Duwaine Gins Apr 17, 2023  4:37 PM) Last used about two weeks ago  Multiple Minerals-Vitamins (CALCIUM  CITRATE PLUS/MAGNESIUM  PO) [id number] No Take 400 mg by mouth daily. [provider] Taking Active Self  nystatin  (MYCOSTATIN /NYSTOP ) powder [id number] No Apply topically  once a week. Adelia Homestead, MD Taking Active   nystatin  ointment (MYCOSTATIN ) [id number] No Apply 1 Application topically 2 (two) times daily as needed (IRRITATION). Adelia Homestead, MD Taking Active   PARoxetine  (PAXIL ) 10 MG tablet [id number] No Take 2.5 tablets (25 mg total) by mouth daily. Adelia Homestead, MD Taking Active   rOPINIRole  (REQUIP  XL) 2 MG 24 hr tablet [id number] No Take 1 tablet (2 mg total) by mouth at bedtime. Dohmeier, Raoul Byes, MD Taking Active   rOPINIRole  (REQUIP ) 0.5 MG tablet [id number] No Take 1 tablet (0.5 mg total) by mouth at bedtime. Dohmeier, Raoul Byes, MD Taking Active   rosuvastatin  (CRESTOR ) 10 MG tablet [id number] No TAKE ONE TABLET BY MOUTH AT BEDTIME. Croitoru, Mihai, MD Taking Active   senna (SENOKOT) 8.6 MG TABS [id number] No Take 1 tablet by mouth at bedtime as needed for mild constipation. [provider] Taking Active Self  sitaGLIPtin -metformin  (JANUMET ) 50-1000 MG tablet [id number] No Take 1 tablet by mouth 2 (two) times daily with a meal. Adelia Homestead, MD Taking Active   sulfamethoxazole -trimethoprim  (BACTRIM  DS) 800-160 MG tablet [id number]  Take 1 tablet by mouth 2 (two) times daily. Adelia Homestead, MD  Active   torsemide  (DEMADEX ) 20 MG tablet [id number] No Take 1 tablet (20 mg total) by mouth as needed (swelling and/or weight gain). Ervin Heath, [place] Taking Active Self           Med Note Duwaine Gins Apr 17, 2023  4:39 PM) Patient takes  2 tablets daily if staying home  traZODone  (DESYREL ) 100 MG tablet [id number] No Up to 200 mg at bedtime for sleep Dohmeier, Raoul Byes, MD Taking Active   triamcinolone  cream (KENALOG ) 0.1 % [id number] No Apply 1 Application topically 2 (two) times daily. Adelia Homestead, MD Taking Active   umeclidinium-vilanterol (ANORO ELLIPTA ) 62.5-25 MCG/ACT AEPB [id number] No Inhale 1 puff into the lungs daily. Faustina Hood, MD Taking Active             Home Care and  Equipment/Supplies: Were Home Health Services Ordered?: NA Any new equipment or medical supplies ordered?: NA  Functional Questionnaire: Do you need assistance with bathing/showering or dressing?: No Do you need assistance with meal preparation?: No Do you need assistance with eating?: No Do you have difficulty maintaining continence: No Do you need assistance with getting out of bed/getting out of a chair/moving?: No Do you have difficulty managing or taking your medications?: No  Follow up appointments reviewed: PCP Follow-up appointment confirmed?: Yes Date of PCP follow-up appointment?: 12/16/23 Follow-up Provider: [facility] Follow-up appointment confirmed?: NA Do you need transportation to your follow-up appointment?: No Do you understand care options if your condition(s) worsen?: Yes-patient verbalized understanding    SIGNATURE Darrall Ellison, LPN [facility] Nurse Health Advisor Direct Dial [phone]

## 2023-12-10 ENCOUNTER — Other Ambulatory Visit: Payer: Self-pay | Admitting: Medical

## 2023-12-10 DIAGNOSIS — M25362 Other instability, left knee: Secondary | ICD-10-CM | POA: Diagnosis not present

## 2023-12-10 DIAGNOSIS — M25562 Pain in left knee: Secondary | ICD-10-CM | POA: Diagnosis not present

## 2023-12-10 DIAGNOSIS — W19XXXA Unspecified fall, initial encounter: Secondary | ICD-10-CM | POA: Diagnosis not present

## 2023-12-11 ENCOUNTER — Encounter: Payer: Self-pay | Admitting: Cardiovascular Disease

## 2023-12-11 ENCOUNTER — Ambulatory Visit
Admission: RE | Admit: 2023-12-11 | Discharge: 2023-12-11 | Disposition: A | Discharge status: Home / Self Care | Source: Ambulatory Visit | Attending: Medical | Admitting: Medical

## 2023-12-11 ENCOUNTER — Ambulatory Visit: Payer: PPO | Attending: Cardiovascular Disease | Admitting: Cardiovascular Disease

## 2023-12-11 VITALS — BP 112/70 | HR 61 | Ht 61.0 in | Wt 177.0 lb

## 2023-12-11 DIAGNOSIS — I10 Essential (primary) hypertension: Secondary | ICD-10-CM

## 2023-12-11 DIAGNOSIS — D6869 Other thrombophilia: Secondary | ICD-10-CM | POA: Diagnosis not present

## 2023-12-11 DIAGNOSIS — D500 Iron deficiency anemia secondary to blood loss (chronic): Secondary | ICD-10-CM

## 2023-12-11 DIAGNOSIS — I5032 Chronic diastolic (congestive) heart failure: Secondary | ICD-10-CM

## 2023-12-11 DIAGNOSIS — R0602 Shortness of breath: Secondary | ICD-10-CM | POA: Diagnosis not present

## 2023-12-11 DIAGNOSIS — E871 Hypo-osmolality and hyponatremia: Secondary | ICD-10-CM

## 2023-12-11 DIAGNOSIS — D509 Iron deficiency anemia, unspecified: Secondary | ICD-10-CM | POA: Diagnosis not present

## 2023-12-11 DIAGNOSIS — I2510 Atherosclerotic heart disease of native coronary artery without angina pectoris: Secondary | ICD-10-CM | POA: Diagnosis not present

## 2023-12-11 DIAGNOSIS — D649 Anemia, unspecified: Secondary | ICD-10-CM | POA: Diagnosis not present

## 2023-12-11 DIAGNOSIS — M25562 Pain in left knee: Secondary | ICD-10-CM

## 2023-12-11 DIAGNOSIS — Z79899 Other long term (current) drug therapy: Secondary | ICD-10-CM

## 2023-12-11 DIAGNOSIS — E1169 Type 2 diabetes mellitus with other specified complication: Secondary | ICD-10-CM

## 2023-12-11 DIAGNOSIS — E785 Hyperlipidemia, unspecified: Secondary | ICD-10-CM

## 2023-12-11 DIAGNOSIS — I4821 Permanent atrial fibrillation: Secondary | ICD-10-CM | POA: Diagnosis not present

## 2023-12-11 DIAGNOSIS — S8992XA Unspecified injury of left lower leg, initial encounter: Secondary | ICD-10-CM | POA: Diagnosis not present

## 2023-12-11 LAB — LIPID PANEL

## 2023-12-11 NOTE — Progress Notes (Addendum)
 Cardiology Office Note:    Date:  06/05/2024   ID:  [name], DOB [birth year], MRN [id number]  PCP:  Rollene Almarie LABOR, MD  Cardiologist:  Jacelyn Cuen  ([facility] clinic, [place]) Electrophysiologist:  Fonda Kitty, MD   Referring MD: Rollene Almarie LABOR, *   Chief Complaint  Patient presents with   Atrial Fibrillation   Congestive Heart Failure     History of Present Illness:    [name] is a 73 y.o. female with a hx of CAD (posterior lateral ventricular branch stent in 2009), chronic diastolic heart failure, longstanding persistent atrial fibrillation, diabetes mellitus type 2, hypertension, pericardiocentesis for hemothorax with tamponade in June 2020, while on treatment with oral anticoagulant. She is a retired Doctor, general practice.  She continues to be very prone to injury.  On April 16 she had a fall when she was getting out of bed.  She was evaluated in the emergency room.  CT of the head did not show any evidence of intracranial bleeding.  She had another fall yesterday where she just tripped.  She hurt her knee, but did not hit her head.  She had a CT of the knee that shows no evidence of serious injury.  Neither 1 of these falls was preceded by dizziness and she did not experience loss of consciousness.  She has mild edema of the lower extremities and has NYHA functional class III shortness of breath (she gets short of breath simply putting clothes on).  She is doing her best to avoid sodium rich foods.  Her weight today is 177 pounds, probably 7 pounds above our previous estimation of her dry weight.  BNP checked today was 248, higher than her previous baseline which was just over 100.  Labs performed today showed markedly lower sodium of 120.  She has had hyponatremia consistently for the last 10 years, but usually her sodium is around 128-130.  Her potassium is normal at 5.1 and she has normal renal parameters.  She has not had any chest pain.  She denies any  focal neurological complaints.  She has not had any overt GI bleeding.  Her labs show stable hemoglobin at around 10-11 over the last couple of years, but she still has clear evidence of iron  deficiency.  In March 2022 she was hospitalized for lower GI bleeding with bright blood blood per rectum and a hemoglobin as low as 6.  Work-up with colonoscopy, upper endoscopy and eventually capsule enteroscopy did not really show us  clear source of bleeding, but there were a couple of AVMs in the colon, 1 in the cecum that appeared to be friable.  This was treated with electrocautery.  She has been on anticoagulation since then without overt bleeding problems, but with persistent mild deficiency.  Metabolic control has been excellent with a hemoglobin A1c of 5.9%.  She has not had a recent lipid profile, but in November 2023 her LDL was 25 on rosuvastatin .  She had a nuclear stress test in June 12, 2021 showed low risk findings without evidence of ischemia or previous infarction.  Her last echocardiogram in A October 2023 showed LVEF of 60-65%.  The left atrium is moderately dilated (ESVI 39 mL/m).  There were no serious valvular abnormalities.  She had a normal perfusion pattern on her nuclear stress tests from 2012, 2014, 2016 and October 2022.  Past Medical History:  Diagnosis Date   Anemia, unspecified    Arthritis    Asthma    Atrial fibrillation  with RVR (HCC)    a. on Xarelto    Bell's palsy    Facial nerve decompression in 2001   CHF (congestive heart failure) (HCC)    Chronic low back pain    COPD with asthma (HCC)    Coronary artery disease    Myoview  04/12/11 was entirely normal. ECHO 02/26/08 showed only minor abnormalities. Stenting 05/26/08 of her posterolateral branch to the left circumflex coronary artery. Used a 2.5x31mm Taxus Monorail stent.myoview  2014 was without ischemia   Diabetes mellitus    Type 2   Early cataracts, bilateral    Fatty liver    GERD (gastroesophageal reflux  disease)    Glaucoma    Glaucoma    Goiter    Heart murmur    History of nuclear stress test 2012; 2014   lexiscan ; normal pattern of perfusion; normal, low risk scan    Hyperlipidemia    Hypertension    Panic disorder    Pneumonia 2008   Polycystic ovary disease    Hysterectomy in 1982 for this   Shortness of breath dyspnea    ECHO 02/26/08 showed only minor abnormalities   Spinal stenosis     Past Surgical History:  Procedure Laterality Date   ABDOMINAL HYSTERECTOMY  1982   & BSO; for polycystic ovary disease   CARDIOVERSION N/A 12/17/2013   Procedure: CARDIOVERSION;  Surgeon: Vinie KYM Maxcy, MD;  Location: [facility] ENDOSCOPY;  Service: Cardiovascular;  Laterality: N/A;   CARDIOVERSION N/A 09/30/2015   Procedure: CARDIOVERSION;  Surgeon: Leim VEAR Moose, MD;  Location: [facility] ENDOSCOPY;  Service: Cardiovascular;  Laterality: N/A;   CARDIOVERSION N/A 06/22/2016   Procedure: CARDIOVERSION;  Surgeon: Annabella Scarce, MD;  Location: [facility] ENDOSCOPY;  Service: Cardiovascular;  Laterality: N/A;   CENTRAL LINE INSERTION  01/13/2019   Procedure: CENTRAL LINE INSERTION;  Surgeon: Cherrie Toribio SAUNDERS, MD;  Location: [facility] INVASIVE CV LAB;  Service: Cardiovascular;;   COLONOSCOPY     last 2009; Dr Donnald; due 2019   COLONOSCOPY WITH PROPOFOL  N/A 01/23/2018   Procedure: COLONOSCOPY WITH PROPOFOL ;  Surgeon: Donnald Charleston, MD;  Location: [facility] ENDOSCOPY;  Service: Endoscopy;  Laterality: N/A;   COLONOSCOPY WITH PROPOFOL  N/A 11/04/2020   Procedure: COLONOSCOPY WITH PROPOFOL ;  Surgeon: Donnald Charleston, MD;  Location: [facility] ENDOSCOPY;  Service: Endoscopy;  Laterality: N/A;   CORONARY ANGIOPLASTY  05/26/2008   Stenting of her posterolateral branch to the left circumflex coronary artery. Used a 2.5x55mm Taxus Monorail stent.   ESOPHAGOGASTRODUODENOSCOPY (EGD) WITH PROPOFOL  N/A 01/23/2018   Procedure: ESOPHAGOGASTRODUODENOSCOPY (EGD) WITH PROPOFOL ;  Surgeon: Donnald Charleston, MD;  Location: [facility] ENDOSCOPY;  Service:  Endoscopy;  Laterality: N/A;   ESOPHAGOGASTRODUODENOSCOPY (EGD) WITH PROPOFOL  N/A 11/04/2020   Procedure: ESOPHAGOGASTRODUODENOSCOPY (EGD) WITH PROPOFOL ;  Surgeon: Donnald Charleston, MD;  Location: [facility] ENDOSCOPY;  Service: Endoscopy;  Laterality: N/A;   FACIAL NERVE DECOMPRESSION  2001/2002   bells palsy    GIVENS CAPSULE STUDY N/A 11/04/2020   Procedure: GIVENS CAPSULE STUDY;  Surgeon: Donnald Charleston, MD;  Location: [facility] ENDOSCOPY;  Service: Endoscopy;  Laterality: N/A;   HOT HEMOSTASIS N/A 11/04/2020   Procedure: HOT HEMOSTASIS (ARGON PLASMA COAGULATION/BICAP);  Surgeon: Donnald Charleston, MD;  Location: [facility] ENDOSCOPY;  Service: Endoscopy;  Laterality: N/A;   LAPAROSCOPIC CHOLECYSTECTOMY  06/15/2011    Dr Gail   PERICARDIOCENTESIS N/A 01/13/2019   Procedure: PERICARDIOCENTESIS;  Surgeon: Verlin Lonni BIRCH, MD;  Location: [facility] INVASIVE CV LAB;  Service: Cardiovascular;  Laterality: N/A;   POLYPECTOMY  11/04/2020   Procedure: POLYPECTOMY;  Surgeon: Buccini,  Lamar, MD;  Location: [facility] ENDOSCOPY;  Service: Endoscopy;;   RIGHT AND LEFT HEART CATH N/A 01/13/2019   Procedure: RIGHT AND LEFT HEART CATH;  Surgeon: Cherrie Toribio SAUNDERS, MD;  Location: [facility] INVASIVE CV LAB;  Service: Cardiovascular;  Laterality: N/A;   TEE WITHOUT CARDIOVERSION N/A 12/17/2013   Procedure: TRANSESOPHAGEAL ECHOCARDIOGRAM (TEE);  Surgeon: Vinie KYM Maxcy, MD;  Location: [facility] ENDOSCOPY;  Service: Cardiovascular;  Laterality: N/A;  trish/ja   TOTAL HIP ARTHROPLASTY Left 09/25/2021   Procedure: LEFT TOTAL HIP ARTHROPLASTY ANTERIOR APPROACH;  Surgeon: Liam Lerner, MD;  Location: [facility] ORS;  Service: Orthopedics;  Laterality: Left;   TRANSESOPHAGEAL ECHOCARDIOGRAM (CATH LAB) N/A 05/01/2024   Procedure: TRANSESOPHAGEAL ECHOCARDIOGRAM;  Surgeon: Francyne Headland, MD;  Location: [facility] INVASIVE CV LAB;  Service: Cardiovascular;  Laterality: N/A;   TRANSTHORACIC ECHOCARDIOGRAM  02/26/2008   LV systolic function normal with mild conc LVH; LA mildly dilated;  trace MR/TR   UPPER GI ENDOSCOPY  2009   negative    Current Medications: Current Meds  Medication Sig   Accu-Chek Softclix Lancets lancets    famotidine  (PEPCID ) 20 MG tablet Take 1 tablet (20 mg total) by mouth 2 (two) times daily. (Patient taking differently: Take 20 mg by mouth 2 (two) times daily as needed for heartburn or indigestion.)   glucose blood (FREESTYLE LITE) test strip Use as instructed   latanoprost  (XALATAN ) 0.005 % ophthalmic solution Place 1 drop into both eyes at bedtime.   Multiple Minerals-Vitamins (CALCIUM  CITRATE PLUS/MAGNESIUM  PO) Take 1 tablet by mouth daily.   nystatin  (MYCOSTATIN /NYSTOP ) powder Apply topically once a week. (Patient taking differently: Apply 1 Application topically daily as needed (Rash).)   nystatin  ointment (MYCOSTATIN ) Apply 1 Application topically 2 (two) times daily as needed (IRRITATION).   PARoxetine  (PAXIL ) 10 MG tablet Take 2.5 tablets (25 mg total) by mouth daily. (Patient taking differently: Take 25 mg by mouth at bedtime.)   rOPINIRole  (REQUIP ) 0.5 MG tablet Take 1 tablet (0.5 mg total) by mouth at bedtime. (Patient taking differently: Take 0.5 mg by mouth at bedtime as needed (restless leg).)   senna (SENOKOT) 8.6 MG TABS Take 1 tablet by mouth at bedtime as needed for mild constipation.   triamcinolone  cream (KENALOG ) 0.1 % Apply 1 Application topically 2 (two) times daily. (Patient taking differently: Apply 1 Application topically 2 (two) times daily as needed.)   umeclidinium-vilanterol (ANORO ELLIPTA ) 62.5-25 MCG/ACT AEPB Inhale 1 puff into the lungs daily. (Patient taking differently: Inhale 1 puff into the lungs daily as needed (Breathing).)   [DISCONTINUED] albuterol  (VENTOLIN  HFA) 108 (90 Base) MCG/ACT inhaler Inhale 1 puff into the lungs every 6 (six) hours as needed for wheezing or shortness of breath.   [DISCONTINUED] apixaban  (ELIQUIS ) 5 MG TABS tablet Take 1 tablet (5 mg total) by mouth 2 (two) times daily.   [DISCONTINUED]  benzonatate  (TESSALON ) 200 MG capsule Take 1 capsule (200 mg total) by mouth 3 (three) times daily as needed.   [DISCONTINUED] diltiazem  (CARDIZEM  CD) 180 MG 24 hr capsule Take 1 capsule (180 mg total) by mouth daily.   [DISCONTINUED] gabapentin  (NEURONTIN ) 100 MG capsule Take 1 capsule (100 mg total) by mouth 2 (two) times daily. (Patient not taking: Reported on 05/11/2024)   [DISCONTINUED] guaiFENesin  (MUCINEX ) 600 MG 12 hr tablet Take 600 mg by mouth 2 (two) times daily.   [DISCONTINUED] HYDROcodone -acetaminophen  (NORCO/VICODIN) 5-325 MG tablet Take 1 tablet by mouth daily as needed for moderate pain (pain score 4-6) (This is a 30 day supply).   [  DISCONTINUED] lisinopril  (ZESTRIL ) 10 MG tablet Take 1 tablet (10 mg total) by mouth daily. (Patient taking differently: Take 10 mg by mouth at bedtime.)   [DISCONTINUED] LORazepam  (ATIVAN ) 0.5 MG tablet Take 1 tablet (0.5 mg total) by mouth 2 (two) times daily.   [DISCONTINUED] metoprolol  succinate (TOPROL -XL) 100 MG 24 hr tablet Take 1 tablet (100 mg total) by mouth 2 (two) times daily.   [DISCONTINUED] metoprolol  tartrate (LOPRESSOR ) 25 MG tablet TAKE 1 TABLET ONCE DAILY AS NEEDED FOR RAPID PALPITATIONS. (Patient not taking: Reported on 05/12/2024)   [DISCONTINUED] rOPINIRole  (REQUIP  XL) 2 MG 24 hr tablet Take 1 tablet (2 mg total) by mouth at bedtime. (Patient not taking: Reported on 04/30/2024)   [DISCONTINUED] rosuvastatin  (CRESTOR ) 10 MG tablet TAKE ONE TABLET BY MOUTH AT BEDTIME.   [DISCONTINUED] sitaGLIPtin -metformin  (JANUMET ) 50-1000 MG tablet Take 1 tablet by mouth 2 (two) times daily with a meal.   [DISCONTINUED] sulfamethoxazole -trimethoprim  (BACTRIM  DS) 800-160 MG tablet Take 1 tablet by mouth 2 (two) times daily. (Patient not taking: Reported on 04/28/2024)   [DISCONTINUED] torsemide  (DEMADEX ) 20 MG tablet Take 1 tablet (20 mg total) by mouth as needed (swelling and/or weight gain).   [DISCONTINUED] traZODone  (DESYREL ) 100 MG tablet Up to 200 mg  at bedtime for sleep     Allergies:   Benadryl  [diphenhydramine  hcl], Clopidogrel bisulfate, Ioversol, Benadryl  [diphenhydramine ], and Iodinated contrast media   Family History: The patient's family history includes Cancer in her brother and sister; Colon cancer (age of onset: 22) in her brother; Colon polyps in her sister; Emphysema (age of onset: 72) in her mother; Gout in her brother; Heart attack (age of onset: 39) in her father; Hyperlipidemia in her brother; Hypertension in her brother; Pneumonia in her maternal grandmother; Ulcers in her mother. There is no history of Diabetes or Stroke.  ROS:   Please see the history of present illness.    All other systems are reviewed and are negative.   EKGs/Labs/Other Studies Reviewed:    The following studies were reviewed today: Echocardiogram 05/20/2022   1. Left ventricular ejection fraction, by estimation, is 60 to 65%. The  left ventricle has normal function. The left ventricle has no regional  wall motion abnormalities. Left ventricular diastolic function could not  be evaluated.   2. Right ventricular systolic function is mildly reduced. The right  ventricular size is normal. Tricuspid regurgitation signal is inadequate  for assessing PA pressure.   3. Left atrial size was mildly dilated.   4. The mitral valve is normal in structure. Trivial mitral valve  regurgitation. No evidence of mitral stenosis.   5. The aortic valve is normal in structure. Aortic valve regurgitation is  mild. No aortic stenosis is present.   6. The inferior vena cava is dilated in size with >50% respiratory  variability, suggesting right atrial pressure of 8 mmHg.   Nuclear stress test 06/12/2021   Findings are consistent with no prior ischemia and no prior myocardial infarction. The study is low risk.   No ST deviation was noted.   LV perfusion is normal. There is no evidence of ischemia. There is no evidence of infarction.   End diastolic cavity size  is normal. End systolic cavity size is normal.   Normal perfusion EF may not be accurate due to afib estimated at 51% no RWMAls noted     EKG:    EKG Interpretation Date/Time:  Wednesday December 11 2023 14:11:37 EDT Ventricular Rate:  61 PR Interval:    QRS  Duration:  92 QT Interval:  444 QTC Calculation: 446 R Axis:   -86  Text Interpretation: Atrial fibrillation Left axis deviation When compared with ECG of 17-Apr-2023 16:30, QRS axis Shifted left T wave amplitude has increased in Lateral leads Confirmed by Jesly Hartmann ([id number]) on 12/11/2023 2:23:56 PM         Recent Labs:  02/22/2022 hemoglobin A1c 7.9% 12/11/2023: TSH 3.020 05/11/2024: B Natriuretic Peptide 167.3 05/14/2024: ALT 15 05/25/2024: BUN 30; Creatinine, Ser 1.62; Hemoglobin 8.8; Magnesium  1.2; Platelets 216; Potassium 4.0; Sodium 126   11/14/2020 Creatinine 1.33, potassium 4.3, hemoglobin 10.3   04/01/2023 Hemoglobin 9.2, creatinine 0.6, potassium 4.7, ALT 89, platelets 213 K   Recent Lipid Panel    Component Value Date/Time   CHOL 93 (L) 12/11/2023 1524   TRIG 122 12/11/2023 1524   HDL 37 (L) 12/11/2023 1524   CHOLHDL 2.5 12/11/2023 1524   CHOLHDL 2 06/20/2022 1504   VLDL 34.2 06/20/2022 1504   LDLCALC 34 12/11/2023 1524   LDLDIRECT 143.4 01/23/2008 1316    Physical Exam:    VS:  BP 112/70   Pulse 61   Ht 5' 1 (1.549 m)   Wt 177 lb (80.3 kg) Comment: pt provided  SpO2 96%   BMI 33.44 kg/m     Wt Readings from Last 3 Encounters:  05/27/24 168 lb (76.2 kg)  05/25/24 168 lb (76.2 kg)  05/21/24 169 lb (76.7 kg)     General: Alert, oriented x3, no distress Head: no evidence of trauma, PERRL, EOMI, no exophtalmos or lid lag, no myxedema, no xanthelasma; normal ears, nose and oropharynx Neck: normal jugular venous pulsations and no hepatojugular reflux; brisk carotid pulses without delay and no carotid bruits Chest: clear to auscultation, no signs of consolidation by percussion or palpation,  normal fremitus, symmetrical and full respiratory excursions Cardiovascular: normal position and quality of the apical impulse, irregular rhythm, normal first and second heart sounds, no murmurs, rubs or gallops Abdomen: no tenderness or distention, no masses by palpation, no abnormal pulsatility or arterial bruits, normal bowel sounds, no hepatosplenomegaly Extremities: 1+ symmetrical pretibial edema halfway up to the knees bilaterally. Neurological: grossly nonfocal Psych: Normal mood and affect  ASSESSMENT:    1. Permanent atrial fibrillation (HCC)   2. Chronic diastolic CHF (congestive heart failure) (HCC)   3. Shortness of breath   4. Hyponatremia   5. Acquired thrombophilia   6. Coronary artery disease involving native coronary artery of native heart without angina pectoris   7. Essential hypertension   8. Dyslipidemia   9. Type 2 diabetes mellitus with other specified complication, without long-term current use of insulin  (HCC)   10. Iron  deficiency anemia due to chronic blood loss   11. Medication management        PLAN:    In order of problems listed above:  AFib: In atrial fibrillation today and as always arrhythmia unaware.  Well rate controlled.   Rate control is critical since she has a history of tachycardia cardiomyopathy.   CHA2DS2-VASc 6 (age, gender, hypertension, diabetes, CAD, history of heart failure/LV dysfunction).  On Eliquis . CHF: Very sedentary, unable to assess functional status.  I this is her new dry weight around 183 pounds.    She was an SGLT2 inhibitor but I do not see this on her list of medications anymore.  On beta-blockers and ACE inhibitor.  Preserved LVEF.  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  Reinforced importance of sodium restriction, signs and symptoms of heart  failure were discussed. Hyponatremia: She drinks large amounts of water  and in particular consumes large amounts of ice.  She probably has pica from iron  deficiency.   Hyponatremia has been a constant problem for a decade and in the past she was hospitalized with sodium levels as low as 110, typically her sodium level hangs around 130, but is even lower than that today at 128.  We discussed the importance of fluid restriction preferably to 1500 mL in 24 hours (include ice, water , soda, juices, coffee, tea, etc.). Anticoagulation: She has not had overt bleeding problems in the last couple of years, but continues to have clear evidence of iron  deficiency.   I believe she is an excellent candidate for a Watchman device, but she reports a history of serious allergic response to previous contrast use and is also allergic to Benadryl .  Will discuss with our electrophysiology partners whether they would consider evaluation for watchman solely based on transesophageal echocardiography. CAD: Asymptomatic, no angina.  Low risk recent nuclear stress test.  Near normal LVEF. HTN: good control.  HLP: LDL is acceptable, HDL is chronically low. DM: Improved glycemic control, most recent hemoglobin A1c 5.9 %. Periodic limb movement syndrome: Sees Dr. Chalice for sleep disordered breathing .  On Requip .  COPD: Followed by Dr. Neysa.  FEV1 not too bad at 1.66 L / 82% of predicted. Aortic atherosclerosis: Aortic calcification with normal caliber aorta seen on chest x-ray and chest CT. continue statin therapy. Anemia: Start iron  supplements  [facility] Referral for Left Atrial Appendage Closure with Non-Valvular Atrial Fibrillation   [name] is a 73 y.o. female is being referred to the [facility] Team for evaluation for Left Atrial Appendage Closure with Watchman device for the management of stroke risk resulting form non-valvular atrial fibrillation.    Base upon Ms. Schwenke's history, she is felt to be a poor candidate for long-term anticoagulation because of a history of bleeding (e.g. intracerebral, subdural, GI, retro-peritoneal).  The patient has a  HAS-BLED score of 3 indicating a Yearly Major Bleeding Risk of 3.74%.      Her CHADS2-VASc Score is 7 with an unadjusted Ischemic Stroke Rate (% per year) of 11.2%.    Her stroke risk necessitates a strategy of stroke prevention with either long-term oral anticoagulation or left atrial appendage occlusion therapy. We have discussed their bleeding risk in the context of their comorbid medical problems, as well as the rationale for referral for evaluation of Watchman left atrial appendage occlusion therapy. While the patient is at high long-term bleeding risk, they may be appropriate for short-term anticoagulation. Based on this individual patient's stroke and bleeding risk, a shared decision has been made to refer the patient for consideration of Watchman left atrial appendage closure utilizing the [employer] shared decision tool.   Medication Adjustments/Labs and Tests Ordered: Current medicines are reviewed at length with the patient today.  Concerns regarding medicines are outlined above.  Orders Placed This Encounter  Procedures   Brain natriuretic peptide   CBC   Lipid panel   TSH   Comprehensive metabolic panel with GFR   Hemoglobin A1c   Iron , TIBC and Ferritin Panel   EKG 12-Lead    No orders of the defined types were placed in this encounter.    Patient Instructions  Medication Instructions:  No changes *If you need a refill on your cardiac medications before your next appointment, please call your pharmacy*  Lab Work: CBC,  Iron  studies, Lipid panel, CMP, TSH, HgA1C, BNP- today If you have labs (blood work) drawn today and your tests are completely normal, you will receive your results only by: MyChart Message (if you have MyChart) OR A paper copy in the mail If you have any lab test that is abnormal or we need to change your treatment, we will call you to review the results.  Follow-Up: At [facility], you and your health needs  are our priority.  As part of our continuing mission to provide you with exceptional heart care, our providers are all part of one team.  This team includes your primary Cardiologist (physician) and Advanced Practice Providers or APPs (Physician Assistants and Nurse Practitioners) who all work together to provide you with the care you need, when you need it.  Your next appointment:   6 month(s)  Provider:   Jerel Balding, MD    We recommend signing up for the patient portal called MyChart.  Sign up information is provided on this After Visit Summary.  MyChart is used to connect with patients for Virtual Visits (Telemedicine).  Patients are able to view lab/test results, encounter notes, upcoming appointments, etc.  Non-urgent messages can be sent to your provider as well.   To learn more about what you can do with MyChart, go to [email or web address].         Signed, Lamarr Hummer, PA-C  06/05/2024 10:44 AM    [facility]

## 2023-12-11 NOTE — Patient Instructions (Signed)
 Medication Instructions:  No changes *If you need a refill on your cardiac medications before your next appointment, please call your pharmacy*  Lab Work: CBC, Iron  studies, Lipid panel, CMP, TSH, HgA1C, BNP- today If you have labs (blood work) drawn today and your tests are completely normal, you will receive your results only by: MyChart Message (if you have MyChart) OR A paper copy in the mail If you have any lab test that is abnormal or we need to change your treatment, we will call you to review the results.  Follow-Up: At [facility], you and your health needs are our priority.  As part of our continuing mission to provide you with exceptional heart care, our providers are all part of one team.  This team includes your primary Cardiologist (physician) and Advanced Practice Providers or APPs (Physician Assistants and Nurse Practitioners) who all work together to provide you with the care you need, when you need it.  Your next appointment:   6 month(s)  Provider:   Luana Rumple, MD    We recommend signing up for the patient portal called "MyChart".  Sign up information is provided on this After Visit Summary.  MyChart is used to connect with patients for Virtual Visits (Telemedicine).  Patients are able to view lab/test results, encounter notes, upcoming appointments, etc.  Non-urgent messages can be sent to your provider as well.   To learn more about what you can do with MyChart, go to [email or web address].

## 2023-12-12 ENCOUNTER — Other Ambulatory Visit: Payer: Self-pay | Admitting: *Deleted

## 2023-12-12 ENCOUNTER — Encounter: Payer: Self-pay | Admitting: Cardiovascular Disease

## 2023-12-12 ENCOUNTER — Telehealth: Payer: Self-pay | Admitting: *Deleted

## 2023-12-12 DIAGNOSIS — Z79899 Other long term (current) drug therapy: Secondary | ICD-10-CM

## 2023-12-12 DIAGNOSIS — E871 Hypo-osmolality and hyponatremia: Secondary | ICD-10-CM

## 2023-12-12 LAB — HEMOGLOBIN A1C
Est. average glucose Bld gHb Est-mCnc: 154 mg/dL
Hgb A1c MFr Bld: 7 % — ABNORMAL HIGH (ref 4.8–5.6)

## 2023-12-12 LAB — COMPREHENSIVE METABOLIC PANEL WITH GFR
ALT: 11 IU/L (ref 0–32)
AST: 17 IU/L (ref 0–40)
Albumin: 3.9 g/dL (ref 3.8–4.8)
Alkaline Phosphatase: 69 IU/L (ref 44–121)
BUN/Creatinine Ratio: 15 (ref 12–28)
BUN: 15 mg/dL (ref 8–27)
Bilirubin Total: 0.4 mg/dL (ref 0.0–1.2)
CO2: 20 mmol/L (ref 20–29)
Calcium: 8.4 mg/dL — ABNORMAL LOW (ref 8.7–10.3)
Chloride: 88 mmol/L — ABNORMAL LOW (ref 96–106)
Creatinine, Ser: 0.98 mg/dL (ref 0.57–1.00)
Globulin, Total: 2.7 g/dL (ref 1.5–4.5)
Glucose: 186 mg/dL — ABNORMAL HIGH (ref 70–99)
Potassium: 5.1 mmol/L (ref 3.5–5.2)
Sodium: 120 mmol/L — ABNORMAL LOW (ref 134–144)
Total Protein: 6.6 g/dL (ref 6.0–8.5)
eGFR: 60 mL/min/{1.73_m2} (ref >59)

## 2023-12-12 LAB — LIPID PANEL
Cholesterol, Total: 93 mg/dL — ABNORMAL LOW (ref 100–199)
HDL: 37 mg/dL — ABNORMAL LOW (ref >39)
LDL CALC COMMENT:: 2.5 ratio (ref 0.0–4.4)
LDL Chol Calc (NIH): 34 mg/dL (ref 0–99)
Triglycerides: 122 mg/dL (ref 0–149)
VLDL Cholesterol Cal: 22 mg/dL (ref 5–40)

## 2023-12-12 LAB — CBC
Hematocrit: 32.1 % — ABNORMAL LOW (ref 34.0–46.6)
Hemoglobin: 10.4 g/dL — ABNORMAL LOW (ref 11.1–15.9)
MCH: 28.7 pg (ref 26.6–33.0)
MCHC: 32.4 g/dL (ref 31.5–35.7)
MCV: 88 fL (ref 79–97)
Platelets: 222 10*3/uL (ref 150–450)
RBC: 3.63 x10E6/uL — ABNORMAL LOW (ref 3.77–5.28)
RDW: 13.1 % (ref 11.7–15.4)
WBC: 8.3 10*3/uL (ref 3.4–10.8)

## 2023-12-12 LAB — IRON,TIBC AND FERRITIN PANEL
Ferritin: 26 ng/mL (ref 15–150)
Iron Saturation: 7 % — CL (ref 15–55)
Iron: 34 ug/dL (ref 27–139)
Total Iron Binding Capacity: 480 ug/dL — ABNORMAL HIGH (ref 250–450)
UIBC: 446 ug/dL — ABNORMAL HIGH (ref 118–369)

## 2023-12-12 LAB — BRAIN NATRIURETIC PEPTIDE: BNP: 248.3 pg/mL — ABNORMAL HIGH (ref 0.0–100.0)

## 2023-12-12 LAB — TSH: TSH: 3.02 u[IU]/mL (ref 0.450–4.500)

## 2023-12-12 MED ORDER — TORSEMIDE 20 MG PO TABS: 40.0000 mg | ORAL_TABLET | Freq: Every day | ORAL | 6 refills | Status: DC

## 2023-12-12 MED ORDER — POTASSIUM CHLORIDE CRYS ER 20 MEQ PO TBCR: 20.0000 meq | EXTENDED_RELEASE_TABLET | Freq: Two times a day (BID) | ORAL | 6 refills | Status: DC

## 2023-12-12 MED ORDER — FERROUS SULFATE 325 (65 FE) MG PO TABS: 325.0000 mg | ORAL_TABLET | Freq: Every day | ORAL | Status: DC

## 2023-12-12 NOTE — Telephone Encounter (Signed)
 Called left detail message on voicemail per DPR.  As patient to call back or send message through [place]. Sent new prescription has been sent to pharmacy , lab ordered

## 2023-12-12 NOTE — Telephone Encounter (Signed)
 Patient called   RN read information from Dr C. to patient. She verbalized understanding. She is aware medication changes and lab to be done in 1 week.-( 12/19/23)  BMP , BNP She states she will review information on mychart.

## 2023-12-12 NOTE — Telephone Encounter (Signed)
-----   Message from [facility] sent at 12/12/2023  8:28 AM EDT ----- Anemia stable, but there is still evidence of iron  deficiency. The biggest problem is hyponatremia. She is to restrict fluids to 1500 mL (50 ounces or roughly 6-7 glasses of water  a day). Increase torsemide  to 40 mg a day. Start potassium chloride  20 mEq twice a day. Repeat a basic metabolic panel and a BNP in 1 week please.  Repeat a CBC in 1 month Daily weight log. Start ferrous sulfate  325 mg twice daily.

## 2023-12-13 ENCOUNTER — Encounter: Payer: Self-pay | Admitting: Cardiovascular Disease

## 2023-12-13 ENCOUNTER — Telehealth: Payer: Self-pay | Admitting: Emergency Medicine

## 2023-12-13 NOTE — Telephone Encounter (Signed)
 Croitoru, Karyl Paget, MD  Marlon Simpson, RN; Allison Ivory, RN; Ardeen Kohler, MD We have previously initiated a Watchman referral for this patient, but it was cut short because of her contrast allergy.  She is also reportedly allergic to Benadryl . I spoke yesterday with Dr. Daneil Dunker and said he was willing to consider as a candidate for watchman with a TEE workup rather than CT. Katie, could we please schedule [name] for TEE with me May 13? (Re: AFib).  I did speak to her in quite some detail about it when she was in the office.  She has had numerous endoscopies before so she is familiar with the sedation and the procedure overall.     Dear [name]  You are scheduled for a TEE (Transesophageal Echocardiogram) on Tuesday, May 13 with Dr. Alvis Ba.  Please arrive at the [facility] (Main Entrance A) at [facility]: [address] [place], [place] [address] at 10:00 AM (This time is 1.5 hour(s) before your procedure to ensure your preparation).   Free valet parking service is available. You will check in at ADMITTING.   *Please Note: You will receive a call the day before your procedure to confirm the appointment time. That time may have changed from the original time based on the schedule for that day.*   DIET:  Nothing to eat or drink after midnight except a sip of water  with medications (see medication instructions below)  MEDICATION INSTRUCTIONS: !!IF ANY NEW MEDICATIONS ARE STARTED AFTER TODAY, PLEASE NOTIFY YOUR PROVIDER AS SOON AS POSSIBLE!!  FYI: Medications such as Semaglutide  (Ozempic , Wegovy), Tirzepatide (Mounjaro, Zepbound), Dulaglutide (Trulicity), etc ("GLP1 agonists") AND Canagliflozin  (Invokana ), Dapagliflozin (Farxiga), Empagliflozin (Jardiance), Ertugliflozin (Steglatro), Bexagliflozin Arboriculturist) or any combination with one of these drugs such as Invokamet (Canagliflozin /Metformin ), Synjardy (Empagliflozin/Metformin ), etc ("SGLT2 inhibitors") must be held  around the time of a procedure. This is not a comprehensive list of all of these drugs. Please review all of your medications and talk to your provider if you take any one of these. If you are not sure, ask your provider.  Continue taking your anticoagulant (blood thinner): Apixaban  (Eliquis ).  You will need to continue this after your procedure until you are told by your provider that it is safe to stop.    LABS: No labs needed- recent lab work 12/11/23   FYI:  For your safety, and to allow us  to monitor your vital signs accurately during the surgery/procedure we request: If you have artificial nails, gel coating, SNS etc, please have those removed prior to your surgery/procedure. Not having the nail coverings /polish removed may result in cancellation or delay of your surgery/procedure.  Your support person will be asked to wait in the waiting room during your procedure.  It is OK to have someone drop you off and come back when you are ready to be discharged.  You cannot drive after the procedure and will need someone to drive you home.  Bring your insurance cards.  *Special Note: Every effort is made to have your procedure done on time. Occasionally there are emergencies that occur at the hospital that may cause delays. Please be patient if a delay does occur.   Called and spoke with the patient- ID X [id number]: Gave her the information above. She verbalized that she may not want to do this and she needs a few days to think it over. Told her that I would keep this procedure scheduled for now, and she can let  us  know if she decides she does not want to do it, then we can cancel at that time. She verbalized understanding. Gave number to our office. Sending this information over MyChart as well.

## 2023-12-15 ENCOUNTER — Other Ambulatory Visit: Payer: Self-pay

## 2023-12-15 ENCOUNTER — Emergency Department (HOSPITAL_COMMUNITY)
Admission: EM | Admit: 2023-12-15 | Discharge: 2023-12-15 | Disposition: A | Discharge status: Home / Self Care | Attending: Emergency Medicine | Admitting: Emergency Medicine

## 2023-12-15 ENCOUNTER — Encounter (HOSPITAL_COMMUNITY): Payer: Self-pay

## 2023-12-15 ENCOUNTER — Emergency Department (HOSPITAL_COMMUNITY)

## 2023-12-15 DIAGNOSIS — W19XXXA Unspecified fall, initial encounter: Secondary | ICD-10-CM | POA: Diagnosis not present

## 2023-12-15 DIAGNOSIS — S0990XA Unspecified injury of head, initial encounter: Secondary | ICD-10-CM | POA: Diagnosis not present

## 2023-12-15 DIAGNOSIS — Y92009 Unspecified place in unspecified non-institutional (private) residence as the place of occurrence of the external cause: Secondary | ICD-10-CM | POA: Diagnosis not present

## 2023-12-15 DIAGNOSIS — I509 Heart failure, unspecified: Secondary | ICD-10-CM | POA: Diagnosis not present

## 2023-12-15 DIAGNOSIS — I110 Hypertensive heart disease with heart failure: Secondary | ICD-10-CM | POA: Insufficient documentation

## 2023-12-15 DIAGNOSIS — Z7901 Long term (current) use of anticoagulants: Secondary | ICD-10-CM | POA: Diagnosis not present

## 2023-12-15 DIAGNOSIS — I4819 Other persistent atrial fibrillation: Secondary | ICD-10-CM | POA: Insufficient documentation

## 2023-12-15 DIAGNOSIS — I700 Atherosclerosis of aorta: Secondary | ICD-10-CM | POA: Diagnosis not present

## 2023-12-15 DIAGNOSIS — E871 Hypo-osmolality and hyponatremia: Secondary | ICD-10-CM | POA: Diagnosis not present

## 2023-12-15 DIAGNOSIS — M25531 Pain in right wrist: Secondary | ICD-10-CM | POA: Diagnosis not present

## 2023-12-15 DIAGNOSIS — W0110XA Fall on same level from slipping, tripping and stumbling with subsequent striking against unspecified object, initial encounter: Secondary | ICD-10-CM | POA: Diagnosis not present

## 2023-12-15 DIAGNOSIS — S60811A Abrasion of right wrist, initial encounter: Secondary | ICD-10-CM | POA: Insufficient documentation

## 2023-12-15 DIAGNOSIS — J449 Chronic obstructive pulmonary disease, unspecified: Secondary | ICD-10-CM | POA: Diagnosis not present

## 2023-12-15 DIAGNOSIS — R739 Hyperglycemia, unspecified: Secondary | ICD-10-CM | POA: Diagnosis not present

## 2023-12-15 DIAGNOSIS — S0003XA Contusion of scalp, initial encounter: Secondary | ICD-10-CM | POA: Diagnosis not present

## 2023-12-15 DIAGNOSIS — M5020 Other cervical disc displacement, unspecified cervical region: Secondary | ICD-10-CM | POA: Diagnosis not present

## 2023-12-15 DIAGNOSIS — M8580 Other specified disorders of bone density and structure, unspecified site: Secondary | ICD-10-CM | POA: Diagnosis not present

## 2023-12-15 DIAGNOSIS — R410 Disorientation, unspecified: Secondary | ICD-10-CM | POA: Diagnosis not present

## 2023-12-15 DIAGNOSIS — I6523 Occlusion and stenosis of bilateral carotid arteries: Secondary | ICD-10-CM | POA: Diagnosis not present

## 2023-12-15 DIAGNOSIS — Z043 Encounter for examination and observation following other accident: Secondary | ICD-10-CM | POA: Diagnosis not present

## 2023-12-15 LAB — BASIC METABOLIC PANEL WITH GFR
Anion gap: 11 (ref 5–15)
BUN: 11 mg/dL (ref 8–23)
CO2: 23 mmol/L (ref 22–32)
Calcium: 8.5 mg/dL — ABNORMAL LOW (ref 8.9–10.3)
Chloride: 90 mmol/L — ABNORMAL LOW (ref 98–111)
Creatinine, Ser: 1.03 mg/dL — ABNORMAL HIGH (ref 0.44–1.00)
GFR, Estimated: 57 mL/min — ABNORMAL LOW
Glucose, Bld: 167 mg/dL — ABNORMAL HIGH (ref 70–99)
Potassium: 4.4 mmol/L (ref 3.5–5.1)
Sodium: 124 mmol/L — ABNORMAL LOW (ref 135–145)

## 2023-12-15 LAB — CBC
HCT: 32.6 % — ABNORMAL LOW (ref 36.0–46.0)
Hemoglobin: 11.1 g/dL — ABNORMAL LOW (ref 12.0–15.0)
MCH: 28.2 pg (ref 26.0–34.0)
MCHC: 34 g/dL (ref 30.0–36.0)
MCV: 82.7 fL (ref 80.0–100.0)
Platelets: 225 K/uL (ref 150–400)
RBC: 3.94 MIL/uL (ref 3.87–5.11)
RDW: 12.8 % (ref 11.5–15.5)
WBC: 7.5 K/uL (ref 4.0–10.5)
nRBC: 0 % (ref 0.0–0.2)

## 2023-12-15 LAB — URINALYSIS, ROUTINE W REFLEX MICROSCOPIC
Bacteria, UA: NONE SEEN
Bilirubin Urine: NEGATIVE
Glucose, UA: NEGATIVE mg/dL
Ketones, ur: NEGATIVE mg/dL
Leukocytes,Ua: NEGATIVE
Nitrite: NEGATIVE
Protein, ur: 100 mg/dL — AB
Specific Gravity, Urine: 1.003 — ABNORMAL LOW (ref 1.005–1.030)
pH: 6 (ref 5.0–8.0)

## 2023-12-15 LAB — TROPONIN I (HIGH SENSITIVITY): Troponin I (High Sensitivity): 8 ng/L (ref <18)

## 2023-12-15 MED ORDER — SODIUM CHLORIDE 0.9 % IV BOLUS: 1000.0000 mL | Freq: Once | INTRAVENOUS | Status: DC

## 2023-12-15 NOTE — ED Notes (Signed)
 C-collar removed at this time.

## 2023-12-15 NOTE — ED Triage Notes (Signed)
 Pt here for a fall from home. Unknown LOC. Hit head and has hematoma to back of head. Pt usually axox4. Pt confused on arrival. VSS. Pt on elliquis

## 2023-12-15 NOTE — ED Notes (Signed)
 C-collar applied

## 2023-12-15 NOTE — ED Notes (Signed)
Patient transported to CT with TRN.  

## 2023-12-15 NOTE — ED Provider Notes (Signed)
 [facility] EMERGENCY DEPARTMENT AT [facility] Provider Note   CSN: [id number] Arrival date & time: 12/15/23  9562     History  Chief Complaint  Patient presents with   [name] is a 73 y.o. female hyperlipidemia, hypertension, obesity, COPD, A-fib, obesity, CHF, GERD who takes Eliquis  secondary to her persistent A-fib who presents with concern for fall at home.  She thinks she slipped but is not quite sure.  She does not think that she passed out.  She denies any chest pain, feeling of heart racing.  She denies feeling lightheaded prior to falling.  Some confusion noted on arrival but she is alert and oriented x 4 with GCS 15.  She is able to tell me accurately with clear speech her name, birthdate, today's day, and where she is as well as correctly identifying colors and objects.   Fall       Home Medications Prior to Admission medications   Medication Sig Start Date End Date Taking? Authorizing Provider  Accu-Chek Softclix Lancets lancets  05/17/23   Adelia Homestead, MD  albuterol  (VENTOLIN  HFA) [phone] Base) MCG/ACT inhaler Inhale 1 puff into the lungs every 6 (six) hours as needed for wheezing or shortness of breath.    [provider]  apixaban  (ELIQUIS ) 5 MG TABS tablet Take 1 tablet (5 mg total) by mouth 2 (two) times daily. 02/13/23   Croitoru, Mihai, MD  ascorbic acid  (VITAMIN C ) 1000 MG tablet Take 1,000 mg by mouth daily. Patient not taking: Reported on 12/11/2023    [provider]  benzonatate  (TESSALON ) 200 MG capsule Take 1 capsule (200 mg total) by mouth 3 (three) times daily as needed. 10/09/23   Adelia Homestead, MD  Calcium -Magnesium -Vitamin D  (CALCIUM  MAGNESIUM  PO) Take by mouth.  1 per day    [provider]  diltiazem  (CARDIZEM  CD) 180 MG 24 hr capsule Take 1 capsule (180 mg total) by mouth daily. 07/08/23   Croitoru, Mihai, MD  famotidine  (PEPCID ) 20 MG tablet Take 1 tablet (20 mg total) by mouth 2  (two) times daily. 04/23/23   Colene Dauphin, MD  ferrous sulfate  325 (65 FE) MG tablet Take 1 tablet (325 mg total) by mouth daily with breakfast. 12/12/23   Croitoru, Karyl Paget, MD  gabapentin  (NEURONTIN ) 100 MG capsule Take 1 capsule (100 mg total) by mouth 2 (two) times daily. Patient taking differently: Take 100 mg by mouth 2 (two) times daily. As needed 11/07/23   Adelia Homestead, MD  glucose blood (FREESTYLE LITE) test strip Use as instructed 05/17/23   Adelia Homestead, MD  guaiFENesin  (MUCINEX ) 600 MG 12 hr tablet Take 600 mg by mouth 2 (two) times daily.    [provider]  HYDROcodone -acetaminophen  (NORCO/VICODIN) 5-325 MG tablet Take 1 tablet by mouth daily as needed for moderate pain (pain score 4-6) (This is a 30 day supply). 11/06/23   Adelia Homestead, MD  latanoprost  (XALATAN ) 0.005 % ophthalmic solution Place 1 drop into both eyes at bedtime. 02/07/21   [provider]  lisinopril  (ZESTRIL ) 10 MG tablet Take 1 tablet (10 mg total) by mouth daily. 10/31/23   Croitoru, Mihai, MD  LORazepam  (ATIVAN ) 0.5 MG tablet Take 1 tablet (0.5 mg total) by mouth 2 (two) times daily. 09/23/23   Adelia Homestead, MD  metoprolol  succinate (TOPROL -XL) 100 MG 24 hr tablet Take 1 tablet (100 mg total) by mouth 2 (two) times daily. 05/17/23  Adelia Homestead, MD  metoprolol  tartrate (LOPRESSOR ) 25 MG tablet TAKE 1 TABLET ONCE DAILY AS NEEDED FOR RAPID PALPITATIONS. 04/02/23   Croitoru, Mihai, MD  Multiple Minerals-Vitamins (CALCIUM  CITRATE PLUS/MAGNESIUM  PO) Take 400 mg by mouth daily.    [provider]  Multiple Vitamin (MULTIVITAMIN) tablet Take 1 tablet by mouth daily.    [provider]  nystatin  (MYCOSTATIN /NYSTOP ) powder Apply topically once a week. 05/17/23   Adelia Homestead, MD  nystatin  ointment (MYCOSTATIN ) Apply 1 Application topically 2 (two) times daily as needed (IRRITATION). 05/17/23   Adelia Homestead, MD  PARoxetine  (PAXIL ) 10 MG  tablet Take 2.5 tablets (25 mg total) by mouth daily. 05/17/23   Adelia Homestead, MD  potassium chloride  SA (KLOR-CON  M) 20 MEQ tablet Take 1 tablet (20 mEq total) by mouth 2 (two) times daily. 12/12/23   Croitoru, Mihai, MD  rOPINIRole  (REQUIP  XL) 2 MG 24 hr tablet Take 1 tablet (2 mg total) by mouth at bedtime. 01/21/23   Dohmeier, Raoul Byes, MD  rOPINIRole  (REQUIP ) 0.5 MG tablet Take 1 tablet (0.5 mg total) by mouth at bedtime. 01/21/23   Dohmeier, Raoul Byes, MD  rosuvastatin  (CRESTOR ) 10 MG tablet TAKE ONE TABLET BY MOUTH AT BEDTIME. 04/25/23   Croitoru, Mihai, MD  senna (SENOKOT) 8.6 MG TABS Take 1 tablet by mouth at bedtime as needed for mild constipation.    [provider]  sitaGLIPtin -metformin  (JANUMET ) 50-1000 MG tablet Take 1 tablet by mouth 2 (two) times daily with a meal. 05/17/23   Adelia Homestead, MD  sulfamethoxazole -trimethoprim  (BACTRIM  DS) 800-160 MG tablet Take 1 tablet by mouth 2 (two) times daily. 09/16/23   Adelia Homestead, MD  torsemide  (DEMADEX ) 20 MG tablet Take 2 tablets (40 mg total) by mouth daily. 12/12/23   Croitoru, Mihai, MD  traZODone  (DESYREL ) 100 MG tablet Up to 200 mg at bedtime for sleep 01/21/23   Dohmeier, Raoul Byes, MD  triamcinolone  cream (KENALOG ) 0.1 % Apply 1 Application topically 2 (two) times daily. 05/17/23   Adelia Homestead, MD  umeclidinium-vilanterol (ANORO ELLIPTA ) 62.5-25 MCG/ACT AEPB Inhale 1 puff into the lungs daily. 04/27/23   Faustina Hood, MD      Allergies    Benadryl  [diphenhydramine  hcl], Clopidogrel bisulfate, Ioversol, Benadryl  [diphenhydramine ], and Iodinated contrast media    Review of Systems   Review of Systems  All other systems reviewed and are negative.   Physical Exam Updated Vital Signs BP (!) 164/89   Pulse 89   Temp (!) 97.4 F (36.3 C) (Temporal)   Resp 16   Ht 5\' 2"  (1.575 m)   Wt 81.6 kg   SpO2 96%   BMI 32.92 kg/m  Physical Exam Vitals and nursing note reviewed.  Constitutional:       General: She is not in acute distress.    Appearance: Normal appearance.  HENT:     Head: Normocephalic and atraumatic.  Eyes:     General:        Right eye: No discharge.        Left eye: No discharge.  Cardiovascular:     Rate and Rhythm: Normal rate and regular rhythm.     Heart sounds: No murmur heard.    No friction rub. No gallop.  Pulmonary:     Effort: Pulmonary effort is normal.     Breath sounds: Normal breath sounds.  Abdominal:     General: Bowel sounds are normal.     Palpations: Abdomen is soft.  Skin:  General: Skin is warm and dry.     Capillary Refill: Capillary refill takes less than 2 seconds.     Comments: Some minor abrasions without step-off, deformity of the right wrist.  Large hematoma without laceration or active bleeding posterior scalp.  Neurological:     Mental Status: She is alert and oriented to person, place, and time.     Comments: Cranial nerves II through XII grossly intact.  Intact finger-nose, intact heel-to-shin.  Romberg and gait deferred until after trauma workup completed. Alert and oriented x3.  Moves all 4 limbs spontaneously, normal coordination.  No pronator drift.  Intact strength 5 out of 5 bilateral upper and lower extremities.   Psychiatric:        Mood and Affect: Mood normal.        Behavior: Behavior normal.     ED Results / Procedures / Treatments   Labs (all labs ordered are listed, but only abnormal results are displayed) Labs Reviewed  CBC - Abnormal; Notable for the following components:      Result Value   Hemoglobin 11.1 (*)    HCT 32.6 (*)    All other components within normal limits  BASIC METABOLIC PANEL WITH GFR - Abnormal; Notable for the following components:   Sodium 124 (*)    Chloride 90 (*)    Glucose, Bld 167 (*)    Creatinine, Ser 1.03 (*)    Calcium  8.5 (*)    GFR, Estimated 57 (*)    All other components within normal limits  URINALYSIS, ROUTINE W REFLEX MICROSCOPIC - Abnormal; Notable for the  following components:   Color, Urine STRAW (*)    Specific Gravity, Urine 1.003 (*)    Hgb urine dipstick SMALL (*)    Protein, ur 100 (*)    All other components within normal limits  TROPONIN I (HIGH SENSITIVITY)    EKG None  Radiology DG Wrist Complete Right Result Date: 12/15/2023 CLINICAL DATA:  Mechanical fall.  Right wrist pain. EXAM: RIGHT WRIST - COMPLETE 3 VIEW COMPARISON:  None Available. FINDINGS: Soft swelling is present over the ulna. No acute fracture present. The joints are located. Mild osteopenia is noted. IMPRESSION: Soft tissue swelling over the ulna without acute fracture. Electronically Signed   By: Audree Leas M.D.   On: 12/15/2023 10:47   CT Cervical Spine Wo Contrast Result Date: 12/15/2023 CLINICAL DATA:  73 year old female status post fall, struck head, it posterior head hematoma. On Eliquis . EXAM: CT CERVICAL SPINE WITHOUT CONTRAST TECHNIQUE: Multidetector CT imaging of the cervical spine was performed without intravenous contrast. Multiplanar CT image reconstructions were also generated. RADIATION DOSE REDUCTION: This exam was performed according to the departmental dose-optimization program which includes automated exposure control, adjustment of the mA and/or kV according to patient size and/or use of iterative reconstruction technique. COMPARISON:  Head CT today. FINDINGS: Alignment: Straightening of cervical lordosis. Cervicothoracic junction alignment is within normal limits. Bilateral posterior element alignment is within normal limits. Skull base and vertebrae: Osteopenia in the cervical spine. Visualized skull base is intact. No atlanto-occipital dissociation. C1 and C2 appear intact and aligned, small right C1 subchondral cyst series 8, image 33. Minor motion artifact elsewhere in the cervical spine. No acute osseous abnormality identified. Soft tissues and spinal canal: No prevertebral fluid or swelling. No visible canal hematoma. Retropharyngeal  carotids, bulky bilateral carotid calcified atherosclerosis series 3, image 58. Subcentimeter thyroid  nodules (no follow-up imaging recommended). Disc levels: Combination of upper cervical spine facet arthropathy which  is greater on the left, lower cervical spine disc bulging. No significant spinal stenosis by CT. Upper chest: Visible upper thoracic levels appear intact. Negative lung apices. Other: Right mastoidectomy. IMPRESSION: 1. Mild motion artifact. No acute traumatic injury identified in the cervical spine. 2. Osteopenia and cervical spine degeneration. 3. Bulky bilateral cervical carotid calcified atherosclerosis. Electronically Signed   By: Marlise Simpers M.D.   On: 12/15/2023 10:35   CT Head Wo Contrast Result Date: 12/15/2023 CLINICAL DATA:  73 year old female status post fall, struck head, posterior head hematoma. On Eliquis . EXAM: CT HEAD WITHOUT CONTRAST TECHNIQUE: Contiguous axial images were obtained from the base of the skull through the vertex without intravenous contrast. RADIATION DOSE REDUCTION: This exam was performed according to the departmental dose-optimization program which includes automated exposure control, adjustment of the mA and/or kV according to patient size and/or use of iterative reconstruction technique. COMPARISON:  Head CT 11/27/2023. FINDINGS: Brain: Stable cerebral volume, within normal limits for age. No midline shift, ventriculomegaly, mass effect, evidence of mass lesion, intracranial hemorrhage or evidence of cortically based acute infarction. Gray-white matter differentiation is within normal limits for age throughout the brain. Vascular: No suspicious intracranial vascular hyperdensity. Calcified atherosclerosis at the skull base. Skull: Stable and intact.  Chronic right mastoidectomy. Sinuses/Orbits: Chronic right mastoidectomy. Visualized paranasal sinuses and mastoids are stable and well aerated. Other: Broad-based posterior left scalp convexity hematoma measuring up to  11 mm in thickness. Underlying calvarium appears stable and intact. No scalp soft tissue gas identified. Visualized orbit soft tissues are within normal limits. IMPRESSION: 1. Broad-based posterior left scalp hematoma.  No skull fracture. 2. Stable and negative for age noncontrast CT appearance of the brain. Electronically Signed   By: Marlise Simpers M.D.   On: 12/15/2023 10:33   DG Chest Portable 1 View Result Date: 12/15/2023 CLINICAL DATA:  Fall. EXAM: PORTABLE CHEST 1 VIEW COMPARISON:  12/17/2022. FINDINGS: Cardiac silhouette appears prominent. No pneumonia or pulmonary edema. No pneumothorax or pleural effusion. Calcified aorta. Osseous structures grossly intact. IMPRESSION: Enlarged cardiac silhouette. Electronically Signed   By: Sydell Eva M.D.   On: 12/15/2023 10:18    Procedures Procedures    Medications Ordered in ED Medications - No data to display  ED Course/ Medical Decision Making/ A&P Clinical Course as of 12/15/23 1318  Sun Dec 15, 2023  1130 Chronically low sodium was actually lower a few days ago, they have not been able to figure out why it is still low, she was started on a higher dose of torsemide , and encouraged to take potassium. [CP]    Clinical Course User Index [CP] Nelly Banco, PA-C                                 Medical Decision Making Amount and/or Complexity of Data Reviewed Labs: ordered. Radiology: ordered.   This patient is a 73 y.o. female  who presents to the ED for concern of fall, head injury, increasing frequency of falls.   Differential diagnoses prior to evaluation: The emergent differential diagnosis includes, but is not limited to,  epidural hematoma, subdural hematoma, skull fracture, subarachnoid hemorrhage, unstable cervical spine fracture, concussion vs other MSK injury , CVA, ACS, arrhythmia, vasovagal / orthostatic hypotension, sepsis, hypoglycemia, electrolyte disturbance, respiratory failure, anemia, dehydration, heat injury,  polypharmacy, malignancy, anxiety/panic attack.  . This is not an exhaustive differential.   Past Medical History / Co-morbidities / Social History: hypertension,  obesity, COPD, A-fib, obesity, CHF, GERD who takes Eliquis  secondary to her persistent A-fib  Additional history: Chart reviewed. Pertinent results include: Reviewed lab work, imaging from previous emergency department visits, outpatient cardiology visits, she has had some chronic hyponatremia, it was worse yesterday 120, she has had some change in her diuretic medications by her cardiologist to try to combat her chronic hyponatremia.  Physical Exam: Physical exam performed. The pertinent findings include: Some minor abrasions without step-off, deformity of the right wrist.  Large hematoma without laceration or active bleeding posterior scalp.   Cranial nerves II through XII grossly intact.  Intact finger-nose, intact heel-to-shin.  Romberg and gait deferred until after trauma workup completed. Alert and oriented x3.  Moves all 4 limbs spontaneously, normal coordination.  No pronator drift.  Intact strength 5 out of 5 bilateral upper and lower extremities.  Vital signs stable other than some tachycardia, regular heart rate, she does have chronic A-fib, she has 1 episode of heart rate of 108, otherwise has been with normal rate.  She is hypertensive on recheck, blood pressure 164/89.  Lab Tests/Imaging studies: I personally interpreted labs/imaging and the pertinent results include: BMP with significant hyponatremia, sodium 124, mildly elevated glucose 167, creatinine fairly stable at 1.03.  One-time troponin is 8 in context of no chest pain, do not suspect a cardiac etiology of her syncope.Aaron Aas  Unremarkable UA.  I independently interpreted right wrist x-ray, CT of the head and C-spine with no evidence of acute cervical spinal or intracranial pathology.  I agree with the radiologist interpretation.  Cardiac monitoring: EKG obtained and  interpreted by myself and attending physician which shows: afib, chronic   Disposition: After consideration of the diagnostic results and the patients response to treatment, I feel that patient despite having some chronic hyponatremia is persistently hyponatremic today, with no other clear etiology for her to have fallen I do suspect symptomatic hyponatremia to be the major underlying etiology, given critical value of sodium 124 we ultimately recommended hospital admission, however patient declined given the chronicity of her symptoms, ultimately think it is reasonable given her close follow-up this week with her primary care doctor and specialists to discharge with no other emergent etiology identified for her fall today, no evidence of acute intracranial abnormality other than her posterior scalp hematoma.  Extensive return precautions given..   emergency department workup does suggest an emergent condition requiring admission, however after extensive discussion she declines, no immediate intervention beyond what has been performed at this time. The plan is: as above. The patient is safe for discharge with close monitoring and has been instructed to return immediately for worsening symptoms, change in symptoms or any other concerns.  Final Clinical Impression(s) / ED Diagnoses Final diagnoses:  Fall, initial encounter  Hyponatremia  Persistent atrial fibrillation (HCC)  Hematoma of scalp, initial encounter    Rx / DC Orders ED Discharge Orders     None         Nelly Banco, PA-C 12/15/23 1318    Auston Blush, MD 12/15/23 1530

## 2023-12-15 NOTE — ED Notes (Addendum)
 Trauma Response Nurse Documentation   [name] is a 73 y.o. female arriving to [facility] ED via EMS  On Eliquis  (apixaban ) daily. Trauma was activated as a Level 2 by ED Charge RN based on the following trauma criteria Elderly patients > 65 with head trauma on anti-coagulation (excluding ASA).  Patient cleared for CT by Dr. Synetta Eves. Pt transported to CT with trauma response nurse present to monitor. RN remained with the patient throughout their absence from the department for clinical observation.   GCS 14.  History   Past Medical History:  Diagnosis Date   Anemia, unspecified    Arthritis    Asthma    Atrial fibrillation with RVR (HCC)    a. on Xarelto    Bell's palsy    Facial nerve decompression in 2001   CHF (congestive heart failure) (HCC)    Chronic low back pain    COPD with asthma (HCC)    Coronary artery disease    Myoview  04/12/11 was entirely normal. ECHO 02/26/08 showed only minor abnormalities. Stenting 05/26/08 of her posterolateral branch to the left circumflex coronary artery. Used a 2.5x16mm Taxus Monorail stent.myoview  2014 was without ischemia   Diabetes mellitus    Type 2   Early cataracts, bilateral    Fatty liver    GERD (gastroesophageal reflux disease)    Glaucoma    Glaucoma    Goiter    Heart murmur    History of nuclear stress test 2012; 2014   lexiscan ; normal pattern of perfusion; normal, low risk scan    Hyperlipidemia    Hypertension    Panic disorder    Pneumonia 2008   Polycystic ovary disease    Hysterectomy in 1982 for this   Shortness of breath dyspnea    ECHO 02/26/08 showed only minor abnormalities   Spinal stenosis      Past Surgical History:  Procedure Laterality Date   ABDOMINAL HYSTERECTOMY  1982   & BSO; for polycystic ovary disease   CARDIOVERSION N/A 12/17/2013   Procedure: CARDIOVERSION;  Surgeon: Hazle Lites, MD;  Location: [facility] ENDOSCOPY;  Service: Cardiovascular;  Laterality: N/A;   CARDIOVERSION N/A 09/30/2015    Procedure: CARDIOVERSION;  Surgeon: Liza Riggers, MD;  Location: [facility] ENDOSCOPY;  Service: Cardiovascular;  Laterality: N/A;   CARDIOVERSION N/A 06/22/2016   Procedure: CARDIOVERSION;  Surgeon: Maudine Sos, MD;  Location: [facility] ENDOSCOPY;  Service: Cardiovascular;  Laterality: N/A;   CENTRAL LINE INSERTION  01/13/2019   Procedure: CENTRAL LINE INSERTION;  Surgeon: Mardell Shade, MD;  Location: [facility] INVASIVE CV LAB;  Service: Cardiovascular;;   COLONOSCOPY     last 2009; Dr Dellis Fermo; due 2019   COLONOSCOPY WITH PROPOFOL  N/A 01/23/2018   Procedure: COLONOSCOPY WITH PROPOFOL ;  Surgeon: Lanita Pitman, MD;  Location: [facility] ENDOSCOPY;  Service: Endoscopy;  Laterality: N/A;   COLONOSCOPY WITH PROPOFOL  N/A 11/04/2020   Procedure: COLONOSCOPY WITH PROPOFOL ;  Surgeon: Lanita Pitman, MD;  Location: [facility] ENDOSCOPY;  Service: Endoscopy;  Laterality: N/A;   CORONARY ANGIOPLASTY  05/26/2008   Stenting of her posterolateral branch to the left circumflex coronary artery. Used a 2.5x60mm Taxus Monorail stent.   ESOPHAGOGASTRODUODENOSCOPY (EGD) WITH PROPOFOL  N/A 01/23/2018   Procedure: ESOPHAGOGASTRODUODENOSCOPY (EGD) WITH PROPOFOL ;  Surgeon: Lanita Pitman, MD;  Location: [facility] ENDOSCOPY;  Service: Endoscopy;  Laterality: N/A;   ESOPHAGOGASTRODUODENOSCOPY (EGD) WITH PROPOFOL  N/A 11/04/2020   Procedure: ESOPHAGOGASTRODUODENOSCOPY (EGD) WITH PROPOFOL ;  Surgeon: Lanita Pitman, MD;  Location: [facility] ENDOSCOPY;  Service: Endoscopy;  Laterality: N/A;  FACIAL NERVE DECOMPRESSION  2001/2002   bells palsy    GIVENS CAPSULE STUDY N/A 11/04/2020   Procedure: GIVENS CAPSULE STUDY;  Surgeon: Lanita Pitman, MD;  Location: [facility] ENDOSCOPY;  Service: Endoscopy;  Laterality: N/A;   HOT HEMOSTASIS N/A 11/04/2020   Procedure: HOT HEMOSTASIS (ARGON PLASMA COAGULATION/BICAP);  Surgeon: Lanita Pitman, MD;  Location: [facility] ENDOSCOPY;  Service: Endoscopy;  Laterality: N/A;   LAPAROSCOPIC CHOLECYSTECTOMY  06/15/2011    Dr Lauralee Poll    PERICARDIOCENTESIS N/A 01/13/2019   Procedure: PERICARDIOCENTESIS;  Surgeon: Odie Benne, MD;  Location: [facility] INVASIVE CV LAB;  Service: Cardiovascular;  Laterality: N/A;   POLYPECTOMY  11/04/2020   Procedure: POLYPECTOMY;  Surgeon: Lanita Pitman, MD;  Location: [facility] ENDOSCOPY;  Service: Endoscopy;;   RIGHT AND LEFT HEART CATH N/A 01/13/2019   Procedure: RIGHT AND LEFT HEART CATH;  Surgeon: Mardell Shade, MD;  Location: [facility] INVASIVE CV LAB;  Service: Cardiovascular;  Laterality: N/A;   TEE WITHOUT CARDIOVERSION N/A 12/17/2013   Procedure: TRANSESOPHAGEAL ECHOCARDIOGRAM (TEE);  Surgeon: Hazle Lites, MD;  Location: [facility] ENDOSCOPY;  Service: Cardiovascular;  Laterality: N/A;  trish/ja   TOTAL HIP ARTHROPLASTY Left 09/25/2021   Procedure: LEFT TOTAL HIP ARTHROPLASTY ANTERIOR APPROACH;  Surgeon: Wendolyn Hamburger, MD;  Location: [facility] ORS;  Service: Orthopedics;  Laterality: Left;   TRANSTHORACIC ECHOCARDIOGRAM  02/26/2008   LV systolic function normal with mild conc LVH; LA mildly dilated; trace MR/TR   UPPER GI ENDOSCOPY  2009   negative     Initial Focused Assessment (If applicable, or please see trauma documentation): Airway: intact, patent Breathing: Breath sounds clear, equal bilaterally. No SOB, No CP. SpO2 95% on RA.  Circulation: Large Hematoma measuring approx 10cm to the occipital region of head.  Small abrasions to R wrist/hand. Pulses intact and equal throughout.  SBP WDL. Disability: PERRLA 3's. MAE equally, equal sensation in all 4 extremities.  Intermittent expressive aphasia.   No c-collar present due to pt's refusal/comfort due to large hematoma.  VS: WDL  Pain: 7/10 to the back of head and to L side of neck.   CT's Completed:   CT Head and CT C-Spine   Interventions:  -[place] c-collar applied -18G PIV to R [facility] -Labs drawn -Undressed and fully assessed pt.  -CXR -R hand XR -CT head and c-spine -Spoke with pt's wife, Gregary Lean about chain of events and plan of  care. -c-spine cleared by EDP -UA ordered  Plan for disposition:  Other Awaiting plan of care.  Consults completed:  none at 1100.  Event Summary: Pt was BIB [facility] after sustaining injuries due to a fall this morning.  According to pt's wife, pt had an unwitnessed fall at approximately 830 or so this morning.  Pt then called her wife to notify her of the fall.  Pt doesn't fully remember the chain of events and what led up to the fall.  Per pt's wife, this is the third fall in approx 2 weeks.  Pt is generally A/Ox4 minus some minimal confusion due to mostly low sodiums etc.  Pt still works as a Health and safety inspector and is able to recall this today.  Pt is answering most questions appropriately but is experiencing some intermittent expressive aphasia and states that it is taking her a long time to get words out.   Pt has a hx of a-fib and is on eliquis  for this. (Per previous hx, she used to be on xarelto ).   Pt states that she had no LOC  at the time of the fall but has been experiencing the word finding difficulties as well as HA and neck pain.  Per pt's wife, she was also experiencing some R sided jaw pain as well.   Bedside handoff with ED RN Truett Gab.    Juan Noel  Trauma Response RN  Please call TRN at [phone] for further assistance.

## 2023-12-15 NOTE — Progress Notes (Signed)
   12/15/23 1200  Spiritual Encounters  Type of Visit Initial  Care provided to: Pt and family  Reason for visit Trauma  OnCall Visit Yes    Chaplain was paged to trauma level 2. Patient fell while she was in the kitchen. She stated that she just wanted to get some snack in the morning and went to kitchen, then all of a sudden she fell. Her wife was at the bedside. Chaplain provided support.  Chaplain will be available if needed.  M.Kubra Welby Hale Resident [phone]

## 2023-12-15 NOTE — Progress Notes (Signed)
 Orthopedic Tech Progress Note Patient Details:  [name] 1950-12-16 [id number]  Patient ID: [name], female   DOB: 12/13/1950, 73 y.o.   MRN: [id number] Level II; not currently needed. [name] 12/15/2023, 9:54 AM

## 2023-12-15 NOTE — Discharge Instructions (Addendum)
 Again we ultimately recommend that you stay in the hospital due to your low sodium as this does seem to be an emergent cause of your falls despite the fact that you have had some chronic hyponatremia you can still develop increased symptomatic presentation.  I recommend that you do not walk anywhere without assistance in standing and transferring, please follow-up with your primary care doctor and cardiologist at your earliest convenience to further discuss management since you declined admission to the hospital today.

## 2023-12-16 ENCOUNTER — Ambulatory Visit (INDEPENDENT_AMBULATORY_CARE_PROVIDER_SITE_OTHER): Admitting: Internal Medicine

## 2023-12-16 ENCOUNTER — Encounter: Payer: Self-pay | Admitting: Internal Medicine

## 2023-12-16 VITALS — BP 120/84 | HR 105 | Temp 97.8°F | Ht 62.0 in

## 2023-12-16 DIAGNOSIS — F1120 Opioid dependence, uncomplicated: Secondary | ICD-10-CM | POA: Diagnosis not present

## 2023-12-16 DIAGNOSIS — E871 Hypo-osmolality and hyponatremia: Secondary | ICD-10-CM

## 2023-12-16 DIAGNOSIS — I4811 Longstanding persistent atrial fibrillation: Secondary | ICD-10-CM

## 2023-12-16 DIAGNOSIS — R2681 Unsteadiness on feet: Secondary | ICD-10-CM

## 2023-12-16 MED ORDER — HYDROCODONE-ACETAMINOPHEN 5-325 MG PO TABS: 1.0000 | ORAL_TABLET | Freq: Every day | ORAL | 0 refills | Status: DC | PRN

## 2023-12-16 NOTE — Progress Notes (Signed)
   Subjective:   Patient ID: [name], female    DOB: [birth year], 73 y.o.   MRN: [id number]  HPI The patient is a 73 YO female coming in for ER follow up (fall and low sodium and feeling off). She is still feeling slow and some cognitive slowing at home. No falls since ER visit but being careful.   PMH, [facility], social history reviewed and updated  Review of Systems  Constitutional:  Positive for activity change and fatigue.  HENT: Negative.    Eyes: Negative.   Respiratory:  Positive for shortness of breath. Negative for cough and chest tightness.   Cardiovascular:  Negative for chest pain, palpitations and leg swelling.  Gastrointestinal:  Negative for abdominal distention, abdominal pain, constipation, diarrhea, nausea and vomiting.  Musculoskeletal:  Positive for arthralgias, back pain and gait problem.  Skin: Negative.   Psychiatric/Behavioral: Negative.      Objective:  Physical Exam Constitutional:      Appearance: She is well-developed.  HENT:     Head: Normocephalic and atraumatic.  Cardiovascular:     Rate and Rhythm: Normal rate and regular rhythm.  Pulmonary:     Effort: Pulmonary effort is normal. No respiratory distress.     Breath sounds: Normal breath sounds. No wheezing or rales.  Abdominal:     General: Bowel sounds are normal. There is no distension.     Palpations: Abdomen is soft.     Tenderness: There is no abdominal tenderness. There is no rebound.  Musculoskeletal:        General: Tenderness present.     Cervical back: Normal range of motion.  Skin:    General: Skin is warm and dry.  Neurological:     Mental Status: She is alert and oriented to person, place, and time.     Coordination: Coordination abnormal.     Vitals:   12/16/23 1457  BP: 120/84  Pulse: (!) 105  Temp: 97.8 F (36.6 C)  TempSrc: Oral  SpO2: 95%  Height: 5\' 2"  (1.575 m)    Assessment & Plan:  Visit time 25 minutes in face to face communication with patient and  coordination of care, additional 5 minutes spent in record review, coordination or care, ordering tests, communicating/referring to other healthcare professionals, documenting in medical records all on the same day of the visit for total time 30 minutes spent on the visit.

## 2023-12-16 NOTE — Patient Instructions (Signed)
 We will watch the labs again when needed. We have sent in the hydrocodone .

## 2023-12-17 DIAGNOSIS — M1611 Unilateral primary osteoarthritis, right hip: Secondary | ICD-10-CM | POA: Diagnosis not present

## 2023-12-18 DIAGNOSIS — Z79899 Other long term (current) drug therapy: Secondary | ICD-10-CM | POA: Diagnosis not present

## 2023-12-18 DIAGNOSIS — E871 Hypo-osmolality and hyponatremia: Secondary | ICD-10-CM | POA: Diagnosis not present

## 2023-12-19 ENCOUNTER — Encounter: Payer: Self-pay | Admitting: Cardiovascular Disease

## 2023-12-19 LAB — BASIC METABOLIC PANEL WITH GFR
BUN/Creatinine Ratio: 12 (ref 12–28)
BUN: 13 mg/dL (ref 8–27)
CO2: 22 mmol/L (ref 20–29)
Calcium: 9.1 mg/dL (ref 8.7–10.3)
Chloride: 85 mmol/L — ABNORMAL LOW (ref 96–106)
Creatinine, Ser: 1.06 mg/dL — ABNORMAL HIGH (ref 0.57–1.00)
Glucose: 122 mg/dL — ABNORMAL HIGH (ref 70–99)
Potassium: 5.1 mmol/L (ref 3.5–5.2)
Sodium: 122 mmol/L — ABNORMAL LOW (ref 134–144)
eGFR: 55 mL/min/{1.73_m2} — ABNORMAL LOW (ref >59)

## 2023-12-19 LAB — BRAIN NATRIURETIC PEPTIDE: BNP: 107.8 pg/mL — ABNORMAL HIGH (ref 0.0–100.0)

## 2023-12-20 ENCOUNTER — Encounter: Payer: Self-pay | Admitting: Internal Medicine

## 2023-12-20 DIAGNOSIS — M1611 Unilateral primary osteoarthritis, right hip: Secondary | ICD-10-CM | POA: Diagnosis not present

## 2023-12-20 NOTE — Assessment & Plan Note (Signed)
 They are still discussing watchman if she is a candidate and she feels she may push this assessment back due to falls and feeling poorly.

## 2023-12-20 NOTE — Assessment & Plan Note (Signed)
 She is in more pain lately due to falls. We do not want oversedating and increased risk for falls. Currently using hydrocodone  5/325 #30 per month.

## 2023-12-20 NOTE — Assessment & Plan Note (Signed)
 Suspect related to heart failure and exacerbated by recent increase in water  intake. She has cut free water  intake down. Labs were slightly improved and just taken again at cardiology although labs not back at time of visit.

## 2023-12-23 ENCOUNTER — Telehealth: Payer: Self-pay | Admitting: *Deleted

## 2023-12-23 DIAGNOSIS — M1611 Unilateral primary osteoarthritis, right hip: Secondary | ICD-10-CM | POA: Diagnosis not present

## 2023-12-23 NOTE — Progress Notes (Unsigned)
 Care Guide Pharmacy Note  12/23/2023 Name: [name] MRN: [id number] DOB: 1951-02-01  Referred By: Adelia Homestead, MD Reason for referral: Complex Care Management (Outreach to schedule referral with pharmacist ) and Call Attempt #1   [name] is a 73 y.o. year old female who is a primary care patient of Nicolette Barrio Marjory Signs, MD.  Lura Sallies was referred to the pharmacist for assistance related to: med management   An unsuccessful telephone outreach was attempted today to contact the patient who was referred to the pharmacy team for assistance with medication management. Additional attempts will be made to contact the patient.  Kandis Ormond, CMA [place]  [facility], [facility] Guide Direct Dial: [phone]  Fax: [phone] Website: [email or web address]

## 2023-12-24 ENCOUNTER — Telehealth: Payer: Self-pay

## 2023-12-24 ENCOUNTER — Ambulatory Visit (HOSPITAL_COMMUNITY): Admission: RE | Admit: 2023-12-24 | Source: Ambulatory Visit

## 2023-12-24 ENCOUNTER — Ambulatory Visit (HOSPITAL_COMMUNITY): Admission: RE | Admit: 2023-12-24 | Source: Home / Self Care | Admitting: Cardiovascular Disease

## 2023-12-24 ENCOUNTER — Encounter (HOSPITAL_COMMUNITY): Admission: RE | Payer: Self-pay | Source: Home / Self Care

## 2023-12-24 DIAGNOSIS — I4819 Other persistent atrial fibrillation: Secondary | ICD-10-CM

## 2023-12-24 SURGERY — TRANSESOPHAGEAL ECHOCARDIOGRAM (TEE) (CATHLAB)
Anesthesia: Monitor Anesthesia Care

## 2023-12-24 NOTE — Telephone Encounter (Signed)
 The patient did not come to her TEE this AM. She said she rescheduled it but there was a miscommunication - she rescheduled her visit with Dr. Daneil Dunker, not the TEE.  At this point, she does not wish to complete the TEE. She will keep her appointment with Dr. Daneil Dunker on July 25 and decide with him. She was grateful for call and agreed with plan.

## 2023-12-24 NOTE — Progress Notes (Signed)
 This RN attempted to call patient with pre procedure instructions for Scheduled TEE on Dec 24, 2023. Patient informed RN she and Katie rescheduled her appointment.

## 2023-12-24 NOTE — Telephone Encounter (Signed)
-----   Message from Ardeen Kohler sent at 12/15/2023  8:56 PM EDT ----- We could potentially do implant with both ICE and TEE to avoid contrast. Happy to discuss with her the options. I think it is reasonable to have her get TEE with Mihai first to even see if anatomy would be feasible, then with me after based on results.   Josh ----- Message ----- From: Allison Ivory, RN Sent: 12/13/2023   4:20 PM EDT To: Luana Rumple, MD; Marlon Simpson, RN; #  Hi!   Thank you for the update. I spoke with her a couple years ago and she didn't want to proceed with TEE for Watchman suitability because she'd have to get contrast during the LAAO procedure. I'll follow-up with her after TEE and get her in with [facility].   I hope you guys gave a great weekend!  KK ----- Message ----- From: Luana Rumple, MD Sent: 12/13/2023  12:01 PM EDT To: Marlon Simpson, RN; Allison Ivory, RN; #  We have previously initiated a Watchman referral for this patient, but it was cut short because of her contrast allergy.  She is also reportedly allergic to Benadryl . I spoke yesterday with Dr. Daneil Dunker and said he was willing to consider as a candidate for watchman with a TEE workup rather than CT. Katie, could we please schedule Mrs. Perret for TEE with me May 13? (Re: AFib).  I did speak to her in quite some detail about it when she was in the office.  She has had numerous endoscopies before so she is familiar with the sedation and the procedure overall.

## 2023-12-25 DIAGNOSIS — M1611 Unilateral primary osteoarthritis, right hip: Secondary | ICD-10-CM | POA: Diagnosis not present

## 2023-12-25 NOTE — Progress Notes (Unsigned)
 Care Guide Pharmacy Note  12/25/2023 Name: [name] MRN: [id number] DOB: 1951-05-19  Referred By: Adelia Homestead, MD Reason for referral: Complex Care Management (Outreach to schedule referral with pharmacist ) and Call Attempt #1   [name] is a 73 y.o. year old female who is a primary care patient of Nicolette Barrio Marjory Signs, MD.  [name] was referred to the pharmacist for assistance related to:    A second unsuccessful telephone outreach was attempted today to contact the patient who was referred to the pharmacy team for assistance with medication management. Additional attempts will be made to contact the patient.  Kandis Ormond, CMA [place]  [facility], [facility] Guide Direct Dial: [phone]  Fax: [phone] Website: [email or web address]

## 2023-12-26 NOTE — Progress Notes (Signed)
 Care Guide Pharmacy Note  12/26/2023 Name: [name] MRN: [id number] DOB: August 11, 1951  Referred By: Adelia Homestead, MD Reason for referral: Complex Care Management (Outreach to schedule referral with pharmacist ) and Call Attempt #1   [name] is a 73 y.o. year old female who is a primary care patient of Janice Barrio Marjory Signs, MD.  [name] was referred to the pharmacist for assistance related to: med management   A third unsuccessful telephone outreach was attempted today to contact the patient who was referred to the pharmacy team for assistance with medication management. The Population Health team is pleased to engage with this patient at any time in the future upon receipt of referral and should he/she be interested in assistance from the Population Health team.  [name], CMA [facility]  [facility], [facility] Guide Direct Dial: [phone]  Fax: [phone] Website: [email or web address]

## 2024-01-03 ENCOUNTER — Institutional Professional Consult (permissible substitution): Admitting: Cardiology

## 2024-01-07 DIAGNOSIS — M25561 Pain in right knee: Secondary | ICD-10-CM | POA: Diagnosis not present

## 2024-01-08 ENCOUNTER — Telehealth: Payer: Self-pay

## 2024-01-08 DIAGNOSIS — N3001 Acute cystitis with hematuria: Secondary | ICD-10-CM | POA: Diagnosis not present

## 2024-01-08 DIAGNOSIS — R300 Dysuria: Secondary | ICD-10-CM | POA: Diagnosis not present

## 2024-01-08 NOTE — Telephone Encounter (Signed)
 This patient is appearing on a report for being at risk of failing the adherence measure for diabetes medications this calendar year.   Medication: sitagliptin -metformin  50-1000 mg Last fill date: 12/31/23 for 30 day supply  Insurance report was not up to date. No action needed at this time.   Abelina Abide, PharmD PGY1 Pharmacy Resident 01/08/2024 4:59 PM

## 2024-01-10 DIAGNOSIS — M1611 Unilateral primary osteoarthritis, right hip: Secondary | ICD-10-CM | POA: Diagnosis not present

## 2024-01-16 DIAGNOSIS — M1611 Unilateral primary osteoarthritis, right hip: Secondary | ICD-10-CM | POA: Diagnosis not present

## 2024-01-20 DIAGNOSIS — M1611 Unilateral primary osteoarthritis, right hip: Secondary | ICD-10-CM | POA: Diagnosis not present

## 2024-01-23 DIAGNOSIS — M1611 Unilateral primary osteoarthritis, right hip: Secondary | ICD-10-CM | POA: Diagnosis not present

## 2024-01-27 DIAGNOSIS — M1611 Unilateral primary osteoarthritis, right hip: Secondary | ICD-10-CM | POA: Diagnosis not present

## 2024-01-30 ENCOUNTER — Other Ambulatory Visit: Payer: Self-pay | Admitting: Internal Medicine

## 2024-02-03 DIAGNOSIS — M1611 Unilateral primary osteoarthritis, right hip: Secondary | ICD-10-CM | POA: Diagnosis not present

## 2024-02-06 DIAGNOSIS — R051 Acute cough: Secondary | ICD-10-CM | POA: Diagnosis not present

## 2024-02-06 DIAGNOSIS — U071 COVID-19: Secondary | ICD-10-CM | POA: Diagnosis not present

## 2024-02-09 ENCOUNTER — Other Ambulatory Visit: Payer: Self-pay | Admitting: Neurology

## 2024-02-12 DIAGNOSIS — U071 COVID-19: Secondary | ICD-10-CM | POA: Diagnosis not present

## 2024-02-17 DIAGNOSIS — S8392XA Sprain of unspecified site of left knee, initial encounter: Secondary | ICD-10-CM | POA: Diagnosis not present

## 2024-02-17 DIAGNOSIS — M25562 Pain in left knee: Secondary | ICD-10-CM | POA: Diagnosis not present

## 2024-02-21 ENCOUNTER — Encounter: Payer: Self-pay | Admitting: Internal Medicine

## 2024-02-21 DIAGNOSIS — N3090 Cystitis, unspecified without hematuria: Secondary | ICD-10-CM

## 2024-02-24 MED ORDER — HYDROCODONE-ACETAMINOPHEN 5-325 MG PO TABS: 1.0000 | ORAL_TABLET | Freq: Every day | ORAL | 0 refills | Status: DC | PRN

## 2024-02-24 MED ORDER — CIPROFLOXACIN HCL 250 MG PO TABS: 250.0000 mg | ORAL_TABLET | Freq: Two times a day (BID) | ORAL | 0 refills | Status: AC

## 2024-02-24 NOTE — Telephone Encounter (Signed)
 Do you want pt to come in for ov to recheck urine or just lab?  Also requesting refill of hydrocodone 

## 2024-02-27 ENCOUNTER — Other Ambulatory Visit: Payer: Self-pay | Admitting: Cardiovascular Disease

## 2024-02-27 DIAGNOSIS — I4821 Permanent atrial fibrillation: Secondary | ICD-10-CM

## 2024-02-27 NOTE — Telephone Encounter (Signed)
 Prescription refill request for Eliquis  received. Indication: afib  Last office visit: [place], 12/11/2023  Scr: 1.06, 12/18/2023 Age: 73 yo  Weight: 81.6 kg   Refill sent.

## 2024-03-03 ENCOUNTER — Other Ambulatory Visit: Payer: Self-pay | Admitting: Internal Medicine

## 2024-03-05 ENCOUNTER — Ambulatory Visit: Payer: Self-pay | Admitting: Internal Medicine

## 2024-03-05 DIAGNOSIS — J209 Acute bronchitis, unspecified: Secondary | ICD-10-CM

## 2024-03-05 DIAGNOSIS — M1611 Unilateral primary osteoarthritis, right hip: Secondary | ICD-10-CM | POA: Diagnosis not present

## 2024-03-05 NOTE — Telephone Encounter (Signed)
 FYI Only or Action Required?: Action required by provider: refusing ED, requesting x-ray and/or appt.  Patient is followed in Pulmonology for COPD, nocturnal hypoxemia, last seen on 08/23/2022 by Neysa Reggy BIRCH, MD.  Called Nurse Triage reporting Cough, Chest Pain, clear sputum, Fatigue, Fall, and denies SOB but using inhalers more frequently.  Symptoms began several weeks ago.  Interventions attempted: OTC medications: tylenol , Prescription medications: hydrocodone , Rescue inhaler, Maintenance inhaler, and Nebulizer treatments.  Symptoms are: persisting not worsening per pt.  Triage Disposition: Go to ED Now (or PCP Triage)  Patient/caregiver understands and will follow disposition?: No, refuses disposition      Copied from CRM #[id number]. Topic: Clinical - Red Word Triage >> Mar 05, 2024  3:07 PM [name] wrote: [name] that prompted transfer to Nurse Triage: Persistant, hacking cough, some phlem clear, patient states left lung hurts some times  ----------------------------------------------------------------------- From previous Reason for Contact - Scheduling: Patient/patient representative is calling to schedule an appointment. Refer to attachments for appointment information.    ----------------------------------------------------------------------- From previous Reason for Contact - Red Word Triage: Red Word that prompted transfer to Nurse Triage: exhausted, persistent hacking cough Reason for Disposition  Taking a deep breath makes pain worse  Answer Assessment - Initial Assessment Questions E2C2 Pulmonary Triage - Initial Assessment Questions Chief Complaint (e.g., cough, sob, wheezing, fever, chills, sweat or additional symptoms) *Go to specific symptom protocol after initial questions. All of this is at night, cough some during the day, but worst symptoms at nighttime Persistent hacking cough to clear sputum Left lung hurting sometimes, only momentarily, doesn't  stay with me, less than 5 min long definitely, 6-7/10, just happens, usually at night Not much SOB, had some in past few months Several severe falls in last 5 years, 2 of them have taken me to ER, fall about 2.5 weeks ago, did not go to ED for that one, head did not hit floor, went to ortho and had left knee x-rayed just bruised, they didn't look at whole body at all, they look at the part they want to cut Pt confirms she hit her back with the fall No irregularity to heart rate felt than usual No nausea or excessive sweating Weaker oh yeah, been through in the last 4 months shingles and covid, might just be covid hanging around Gets worse with deep breath, be still and let it pass Hasn't woken me up from sleep Not getting more intense or frequent, been the same  How long have symptoms been present? No new symptoms, don't think they're worse, 4 out of 6 nights, just about a month ago  MEDICINES:   Have you used any OTC meds to help with symptoms? Yes If yes, ask What medications? Half of 325 hydrocodone  every night before go to bed Tylenol  for sure Gives relief for lung pain  Have you used your inhalers/maintenance medication? Yes If yes, What medications? Having to use my puffers more regularly and haven't had to do that Rescue albuterol  inhaler 1x/day, getting relief Round inhaler anoro 1x/day  OXYGEN: Do you wear supplemental oxygen? No  Do you monitor your oxygen levels? Yes  What is your usual oxygen saturation reading?  (Note: Pulmonary O2 sats should be 90% or greater) 94-97% one time it was 90% but once got going and woke up and stuff it picked up  1. LOCATION: Where does it hurt?       Left part of inside chest  2. RADIATION: Does the pain go anywhere else? (  e.g., into neck, jaw, arms, back)     no  7. CARDIAC RISK FACTORS: Do you have any history of heart problems or risk factors for heart disease? (e.g., angina, prior heart attack; diabetes,  high blood pressure, high cholesterol, smoker, or strong family history of heart disease)     Significant, no more swelling than usual if wear my socks, takes eliquis   8. PULMONARY RISK FACTORS: Do you have any history of lung disease?  (e.g., blood clots in lung, asthma, emphysema, birth control pills)     No blood clots in legs or lungs, COPD  10. OTHER SYMPTOMS: Do you have any other symptoms? (e.g., dizziness, nausea, vomiting, sweating, fever, difficulty breathing, cough)    Denies  Really what I want if can't see him, just need an x-ray, don't want to pay the expense to go to ER and do all that    Advised pt be examined asap for symptoms, advised ED per hx and symptoms especially given recent fall, pt refusing ED at this time, advised call 911 if worsening symptoms. Sending message to pulm for call back to pt with further recommendations, pt requesting x-ray. Advised pt be examined today at [facility] if not ED.  Protocols used: Chest Pain-A-AH

## 2024-03-05 NOTE — Telephone Encounter (Signed)
 If no office Friday 7/25 with me or APP, the order outpatient CXR- acute bronchitis and lab- CBC w dif, BMET

## 2024-03-06 ENCOUNTER — Institutional Professional Consult (permissible substitution): Admitting: Cardiology

## 2024-03-06 ENCOUNTER — Other Ambulatory Visit: Payer: Self-pay

## 2024-03-06 MED ORDER — ALBUTEROL SULFATE HFA 108 (90 BASE) MCG/ACT IN AERS: 2.0000 | INHALATION_SPRAY | Freq: Four times a day (QID) | RESPIRATORY_TRACT | 0 refills | Status: DC | PRN

## 2024-03-06 NOTE — Telephone Encounter (Signed)
 Received fax from [facility] requesting refill on albuterol  HFA.  Patients last OV 08/23/2022.  Will give one refill only and patient needs OV.  Will notify pharmacy.

## 2024-03-06 NOTE — Telephone Encounter (Signed)
 Called patient.  Spoke with Sharman, patients wife (on [place]).  Sharman states patient is sleeping and does not want to be awakened.  States she is doing okay today and would rather wait to see how she is feeling Monday and will come in the the office for the lab work and CXR.  Will place orders.  Patient's wife verbalized understanding.

## 2024-03-11 ENCOUNTER — Telehealth: Payer: Self-pay | Admitting: Pharmacist

## 2024-03-11 NOTE — Progress Notes (Signed)
 Pharmacy Quality Measure Review  This patient is appearing on a report for being at risk of failing the adherence measure for diabetes medications this calendar year.   Medication: Janumet  Last fill date: 02/11/24 for 90 day supply  Insurance report was not up to date. No action needed at this time.   Darrelyn Drum, PharmD, BCPS, CPP Clinical Pharmacist Practitioner [facility] at [facility] [phone]

## 2024-03-12 DIAGNOSIS — M1611 Unilateral primary osteoarthritis, right hip: Secondary | ICD-10-CM | POA: Diagnosis not present

## 2024-03-19 DIAGNOSIS — M1611 Unilateral primary osteoarthritis, right hip: Secondary | ICD-10-CM | POA: Diagnosis not present

## 2024-03-23 ENCOUNTER — Ambulatory Visit: Attending: Cardiology | Admitting: Cardiology

## 2024-03-23 ENCOUNTER — Encounter: Payer: Self-pay | Admitting: Cardiology

## 2024-03-23 ENCOUNTER — Other Ambulatory Visit: Payer: Self-pay

## 2024-03-23 VITALS — BP 120/60 | HR 58 | Wt 169.0 lb

## 2024-03-23 DIAGNOSIS — D649 Anemia, unspecified: Secondary | ICD-10-CM

## 2024-03-23 DIAGNOSIS — K922 Gastrointestinal hemorrhage, unspecified: Secondary | ICD-10-CM | POA: Diagnosis not present

## 2024-03-23 DIAGNOSIS — I4821 Permanent atrial fibrillation: Secondary | ICD-10-CM | POA: Diagnosis not present

## 2024-03-23 DIAGNOSIS — D6869 Other thrombophilia: Secondary | ICD-10-CM

## 2024-03-23 DIAGNOSIS — R296 Repeated falls: Secondary | ICD-10-CM

## 2024-03-23 DIAGNOSIS — I4819 Other persistent atrial fibrillation: Secondary | ICD-10-CM

## 2024-03-23 NOTE — Progress Notes (Signed)
 Electrophysiology Office Note:   Date:  03/24/2024  ID:  [name], DOB 03-Nov-1950, MRN [id number]  Primary Cardiologist: Jerel Balding, MD Electrophysiologist: Fonda Kitty, MD      History of Present Illness:   [name] is a 73 y.o. female with h/o CAD (posterior lateral ventricular branch stent in 2009), chronic diastolic heart failure, longstanding persistent atrial fibrillation, diabetes mellitus type 2, hypertension, pericardiocentesis for hemothorax with tamponade in June 2020, h/o GI bleeding, falls who is being seen today for evaluation for Watchman device implant at the request of Dr. Balding.  Discussed the use of AI scribe software for clinical note transcription with the patient, who gave verbal consent to proceed.  History of Present Illness [name] is a 73 year old female with anemia and bleeding issues who presents for evaluation of candidacy for the Watchman device due to dye allergy. She was referred by Dr. Balding for evaluation of the Watchman device due to her allergy to contrast dye.  She has a history of anemia and bleeding issues, including bleeding around the heart, in the lungs, and gastrointestinal bleeding. These conditions have contributed to falls, potentially related to her anemia.  She is allergic to the dye typically used in diagnostic procedures, necessitating the consideration of alternative imaging methods to assess her candidacy for the Watchman device.  Her brother has previously received a Watchman device.  She wears compression stockings daily, which she feels are effective in managing fluid retention.  Reports feeling relatively well.  No new or acute complaints today.   Review of systems complete and found to be negative unless listed in HPI.   EP Information / Studies Reviewed:    EKG is not ordered today. EKG from 12/15/23 reviewed which showed AF      Echo 05/2022:   1. Left ventricular ejection fraction, by  estimation, is 60 to 65%. The  left ventricle has normal function. The left ventricle has no regional  wall motion abnormalities. Left ventricular diastolic function could not  be evaluated.   2. Right ventricular systolic function is mildly reduced. The right  ventricular size is normal. Tricuspid regurgitation signal is inadequate  for assessing PA pressure.   3. Left atrial size was mildly dilated.   4. The mitral valve is normal in structure. Trivial mitral valve  regurgitation. No evidence of mitral stenosis.   5. The aortic valve is normal in structure. Aortic valve regurgitation is  mild. No aortic stenosis is present.   6. The inferior vena cava is dilated in size with >50% respiratory  variability, suggesting right atrial pressure of 8 mmHg.   Nuclear Stress 05/2021:   Findings are consistent with no prior ischemia and no prior myocardial infarction. The study is low risk.   No ST deviation was noted.   LV perfusion is normal. There is no evidence of ischemia. There is no evidence of infarction.   End diastolic cavity size is normal. End systolic cavity size is normal.   Normal perfusion EF may not be accurate due to afib estimated at 51% no RWMAls noted    Risk Assessment/Calculations:    CHA2DS2-VASc Score = 7   This indicates a 11.2% annual risk of stroke. The patient's score is based upon: CHF History: 1 HTN History: 1 Diabetes History: 1 Stroke History: 0 Vascular Disease History: 1 Age Score: 2 Gender Score: 1             Physical Exam:   VS:  BP 120/60 (BP Location: Left Arm, Patient Position: Sitting, Cuff Size: Normal)   Pulse (!) 58   Wt 169 lb (76.7 kg)   SpO2 98%   BMI 30.91 kg/m    Wt Readings from Last 3 Encounters:  03/23/24 169 lb (76.7 kg)  12/15/23 180 lb (81.6 kg)  12/11/23 177 lb (80.3 kg)     GEN: Well nourished, well developed in no acute distress NECK: No JVD CARDIAC: Normal rate, irregular RESPIRATORY:  Clear to auscultation  without rales, wheezing or rhonchi  ABDOMEN: Soft, non-distended EXTREMITIES:  No edema; No deformity   ASSESSMENT AND PLAN:   I have seen [name] in the office today who is being considered for a Watchman left atrial appendage closure device. I believe they will benefit from this procedure given their history of atrial fibrillation, CHA2DS2-VASc score of 7 and unadjusted ischemic stroke rate of 11.2% per year. Unfortunately, the patient is not felt to be a long term anticoagulation candidate secondary to chronic anemia, h/o GI bleeding, falls, h/o hemothorax. The patient's chart has been reviewed and I feel that they would be a candidate for short term oral anticoagulation after Watchman implant.   It is my belief that after undergoing a LAA closure procedure, [name] will not need long term anticoagulation which eliminates anticoagulation side effects and major bleeding risk.   Procedural risks for the Watchman implant have been reviewed with the patient including a 0.5% risk of stroke, <1% risk of perforation and <1% risk of device embolization. Other risks include bleeding, vascular damage, tamponade, worsening renal function, and death. The patient understands these risk and wishes to proceed.     The published clinical data on the safety and effectiveness of WATCHMAN include but are not limited to the following: - Holmes DR, Jess BEARD, Sick P et al. for the PROTECT AF Investigators. Percutaneous closure of the left atrial appendage versus warfarin therapy for prevention of stroke in patients with atrial fibrillation: a randomised non-inferiority trial. Lancet 2009; 374: 534-42. GLENWOOD Jess BEARD, Doshi SK, Jonita VEAR Satchel D et al. on behalf of the PROTECT AF Investigators. Percutaneous Left Atrial Appendage Closure for Stroke Prophylaxis in Patients With Atrial Fibrillation 2.3-Year Follow-up of the PROTECT AF (Watchman Left Atrial Appendage System for Embolic Protection in Patients With  Atrial Fibrillation) Trial. Circulation 2013; 127:720-729. - Alli O, Doshi S,  Kar S, Reddy VY, Sievert H et al. Quality of Life Assessment in the Randomized PROTECT AF (Percutaneous Closure of the Left Atrial Appendage Versus Warfarin Therapy for Prevention of Stroke in Patients With Atrial Fibrillation) Trial of Patients at Risk for Stroke With Nonvalvular Atrial Fibrillation. J Am Coll Cardiol 2013; 61:1790-8. GLENWOOD Satchel DR, Archer RAMAN, Price M, Whisenant B, Sievert H, Doshi S, Huber K, Reddy V. Prospective randomized evaluation of the Watchman left atrial appendage Device in patients with atrial fibrillation versus long-term warfarin therapy; the PREVAIL trial. Journal of the [employer] of Cardiology, Vol. 4, No. 1, 2014, 1-11. - Kar S, Doshi SK, Sadhu A, Horton R, Osorio J et al. Primary outcome evaluation of a next-generation left atrial appendage closure device: results from the PINNACLE FLX trial. Circulation 2021;143(18)1754-1762.    HAS-BLED score 3 Hypertension Yes  Abnormal renal and liver function (Dialysis, transplant, Cr >2.26 mg/dL /Cirrhosis or Bilirubin >2x Normal or AST/ALT/AP >3x Normal) No  Stroke No  Bleeding Yes  Labile INR (Unstable/high INR) No  Elderly (>65) Yes  Drugs or alcohol (>= 8 drinks/week, anti-plt  or NSAID) No   CHA2DS2-VASc Score = 7  The patient's score is based upon: CHF History: 1 HTN History: 1 Diabetes History: 1 Stroke History: 0 Vascular Disease History: 1 Age Score: 2 Gender Score: 1       ASSESSMENT AND PLAN: After today's visit with the patient which was dedicated solely for shared decision making visit regarding LAA closure device, the patient decided to proceed with the LAA appendage closure procedure.  The patient has severe contrast allergy and is refusing CT.  We will perform a TEE to assess LAA anatomy.  If anatomy is conducive to LAA closure, then we will perform implant with both TEE and ice in efforts to avoid contrast use at time of  implant.  Permanent Atrial Fibrillation: No cardiac awareness of her arrhythmia. The patient's CHA2DS2-VASc score is 7, indicating a 11.2% annual risk of stroke.    Secondary Hypercoagulable State  Chronic anemia History of GI bleeding Recurrent falls The patient is at significant risk for stroke/thromboembolism based upon her CHA2DS2-VASc Score of 7.  Continue Apixaban  (Eliquis ).    Follow up with Dr. Kennyth 3 months after [facility] implant.   Signed, Fonda Kennyth, MD

## 2024-03-23 NOTE — Patient Instructions (Addendum)
 Medication Instructions:  Your physician recommends that you continue on your current medications as directed. Please refer to the Current Medication list given to you today.  *If you need a refill on your cardiac medications before your next appointment, please call your pharmacy*  Labs: BMET and CBC - please  have these drawn at any [facility] location the week of September 8th  Testing/Procedures: Transesophageal Echocardiogram Your physician has requested that you have a TEE. During a TEE, sound waves are used to create images of your heart. It provides your doctor with information about the size and shape of your heart and how well your heart's chambers and valves are working. In this test, a transducer is attached to the end of a flexible tube that's guided down your throat and into your esophagus (the tube leading from you mouth to your stomach) to get a more detailed image of your heart. You are not awake for the procedure. Please see the instruction sheet given to you today.  Watchman Your physician has requested that you have Left atrial appendage (LAA) closure device implantation is a procedure to put a small device in the LAA of the heart. The LAA is a small sac in the wall of the heart's left upper chamber. Blood clots can form in this area. The device, Watchman closes the LAA to help prevent a blood clot and stroke.   Follow-Up: At [facility], you and your health needs are our priority.  As part of our continuing mission to provide you with exceptional heart care, our providers are all part of one team.  This team includes your primary Cardiologist (physician) and Advanced Practice Providers or APPs (Physician Assistants and Nurse Practitioners) who all work together to provide you with the care you need, when you need it.  We will contact you about next steps after your TEE

## 2024-03-24 DIAGNOSIS — M1611 Unilateral primary osteoarthritis, right hip: Secondary | ICD-10-CM | POA: Diagnosis not present

## 2024-03-26 DIAGNOSIS — M1611 Unilateral primary osteoarthritis, right hip: Secondary | ICD-10-CM | POA: Diagnosis not present

## 2024-03-30 DIAGNOSIS — M1611 Unilateral primary osteoarthritis, right hip: Secondary | ICD-10-CM | POA: Diagnosis not present

## 2024-04-01 ENCOUNTER — Other Ambulatory Visit: Payer: Self-pay | Admitting: Internal Medicine

## 2024-04-02 DIAGNOSIS — M1611 Unilateral primary osteoarthritis, right hip: Secondary | ICD-10-CM | POA: Diagnosis not present

## 2024-04-10 DIAGNOSIS — M1611 Unilateral primary osteoarthritis, right hip: Secondary | ICD-10-CM | POA: Diagnosis not present

## 2024-04-16 DIAGNOSIS — M1611 Unilateral primary osteoarthritis, right hip: Secondary | ICD-10-CM | POA: Diagnosis not present

## 2024-04-20 DIAGNOSIS — M1611 Unilateral primary osteoarthritis, right hip: Secondary | ICD-10-CM | POA: Diagnosis not present

## 2024-04-22 DIAGNOSIS — I4819 Other persistent atrial fibrillation: Secondary | ICD-10-CM | POA: Diagnosis not present

## 2024-04-23 DIAGNOSIS — M1611 Unilateral primary osteoarthritis, right hip: Secondary | ICD-10-CM | POA: Diagnosis not present

## 2024-04-23 LAB — BASIC METABOLIC PANEL WITH GFR
BUN/Creatinine Ratio: 16 (ref 12–28)
BUN: 20 mg/dL (ref 8–27)
CO2: 22 mmol/L (ref 20–29)
Calcium: 9 mg/dL (ref 8.7–10.3)
Chloride: 87 mmol/L — AB (ref 96–106)
Creatinine, Ser: 1.26 mg/dL — AB (ref 0.57–1.00)
Glucose: 97 mg/dL (ref 70–99)
Potassium: 5 mmol/L (ref 3.5–5.2)
Sodium: 121 mmol/L — AB (ref 134–144)
eGFR: 45 mL/min/1.73 — AB (ref >59)

## 2024-04-23 LAB — CBC
Hematocrit: 27.4 % — ABNORMAL LOW (ref 34.0–46.6)
Hemoglobin: 8.8 g/dL — ABNORMAL LOW (ref 11.1–15.9)
MCH: 28.8 pg (ref 26.6–33.0)
MCHC: 32.1 g/dL (ref 31.5–35.7)
MCV: 90 fL (ref 79–97)
Platelets: 192 x10E3/uL (ref 150–450)
RBC: 3.06 x10E6/uL — ABNORMAL LOW (ref 3.77–5.28)
RDW: 13.1 % (ref 11.7–15.4)
WBC: 5.5 x10E3/uL (ref 3.4–10.8)

## 2024-04-27 DIAGNOSIS — M1611 Unilateral primary osteoarthritis, right hip: Secondary | ICD-10-CM | POA: Diagnosis not present

## 2024-04-28 ENCOUNTER — Ambulatory Visit: Admitting: Podiatry

## 2024-04-28 ENCOUNTER — Encounter: Payer: Self-pay | Admitting: Cardiology

## 2024-04-28 VITALS — Ht 62.0 in | Wt 169.0 lb

## 2024-04-28 DIAGNOSIS — B351 Tinea unguium: Secondary | ICD-10-CM

## 2024-04-28 DIAGNOSIS — M79675 Pain in left toe(s): Secondary | ICD-10-CM

## 2024-04-28 DIAGNOSIS — M79674 Pain in right toe(s): Secondary | ICD-10-CM

## 2024-04-29 ENCOUNTER — Encounter: Payer: Self-pay | Admitting: Podiatry

## 2024-04-29 NOTE — Progress Notes (Signed)
  Subjective:  Patient ID: [name] Factor, female    DOB: [birth year],  MRN: [id number]  Chief Complaint  Patient presents with   Nail Problem    Rm 3 RFC/nail trim and to discuss possible toe nail removal.    73 y.o. female presents with the above complaint. History confirmed with patient.  She is referred to me by her sister, her nails are thickened elongated and causing pain and discomfort.  She is interested in considering permanent removal of the nails.  Her A1c is 7.0%  Objective:  Physical Exam: warm, good capillary refill, no trophic changes or ulcerative lesions, normal DP and PT pulses, normal sensory exam, and previous bilateral hallux nail removal. Left Foot: dystrophic yellowed discolored nail plates with subungual debris Right Foot: dystrophic yellowed discolored nail plates with subungual debris  Assessment:   1. Pain due to onychomycosis of toenails of both feet      Plan:  Patient was evaluated and treated and all questions answered.  Discussed the etiology and treatment options for the condition in detail with the patient. Recommended debridement of the nails today. Sharp and mechanical debridement performed of all painful and mycotic nails today. Nails debrided in length and thickness using a nail nipper to level of comfort. Follow up as needed for painful nails.  Could consider matricectomy of the affected nails and she will let me know if she would like to proceed with this in which affected nails she would like to have removed.  She will follow-up me in 3 months for reevaluation.    Return in about 3 months (around 07/28/2024) for at risk diabetic foot care, possible nail removals.

## 2024-04-30 ENCOUNTER — Telehealth: Payer: Self-pay

## 2024-04-30 NOTE — Progress Notes (Signed)
 Left message for return call to give instructions for appointment on 05/01/24

## 2024-04-30 NOTE — Telephone Encounter (Signed)
 Please advise as Md is out of office

## 2024-04-30 NOTE — Telephone Encounter (Signed)
 Copied from CRM [id number]. Topic: Clinical - Medical Advice >> Apr 30, 2024 12:37 PM Jasmin G wrote: Reason for CRM: Pt states that she had some blood work done through heart Dr. And was told that her hemoglobin has dropped 3 points in 6 weeks, pt wanted to see if her PCP, Dr. Rollene thinks it's time for an infusion. Please call pt back at [phone].

## 2024-04-30 NOTE — Telephone Encounter (Signed)
 I would go ahead with the TEE tomorrow. That level of hemoglobin is not too low for a TEE. When she gets here we will recheck the hemoglobin to make sure it is not dropping further. If hemoglobin gets lower than 7 we will plan a transfusion. We can also schedule a iron  infusion, but will need to document a recent iron  level (we can check that tomorrow as well).

## 2024-05-01 ENCOUNTER — Ambulatory Visit (HOSPITAL_COMMUNITY): Admitting: Registered Nurse

## 2024-05-01 ENCOUNTER — Encounter (HOSPITAL_COMMUNITY)
Admission: RE | Disposition: A | Discharge status: Home / Self Care | Payer: Self-pay | Source: Home / Self Care | Attending: Cardiovascular Disease

## 2024-05-01 ENCOUNTER — Other Ambulatory Visit: Payer: Self-pay

## 2024-05-01 ENCOUNTER — Ambulatory Visit (HOSPITAL_BASED_OUTPATIENT_CLINIC_OR_DEPARTMENT_OTHER)
Admission: RE | Admit: 2024-05-01 | Discharge: 2024-05-01 | Disposition: A | Discharge status: Home / Self Care | Source: Ambulatory Visit | Attending: Cardiology | Admitting: Cardiology

## 2024-05-01 ENCOUNTER — Ambulatory Visit (HOSPITAL_COMMUNITY)
Admission: RE | Admit: 2024-05-01 | Discharge: 2024-05-01 | Disposition: A | Discharge status: Home / Self Care | Attending: Cardiovascular Disease | Admitting: Cardiovascular Disease

## 2024-05-01 DIAGNOSIS — Z87891 Personal history of nicotine dependence: Secondary | ICD-10-CM | POA: Insufficient documentation

## 2024-05-01 DIAGNOSIS — I2510 Atherosclerotic heart disease of native coronary artery without angina pectoris: Secondary | ICD-10-CM | POA: Insufficient documentation

## 2024-05-01 DIAGNOSIS — I509 Heart failure, unspecified: Secondary | ICD-10-CM | POA: Diagnosis not present

## 2024-05-01 DIAGNOSIS — I110 Hypertensive heart disease with heart failure: Secondary | ICD-10-CM | POA: Diagnosis not present

## 2024-05-01 DIAGNOSIS — I4891 Unspecified atrial fibrillation: Secondary | ICD-10-CM

## 2024-05-01 DIAGNOSIS — I351 Nonrheumatic aortic (valve) insufficiency: Secondary | ICD-10-CM

## 2024-05-01 DIAGNOSIS — Z7984 Long term (current) use of oral hypoglycemic drugs: Secondary | ICD-10-CM | POA: Diagnosis not present

## 2024-05-01 DIAGNOSIS — Z7901 Long term (current) use of anticoagulants: Secondary | ICD-10-CM | POA: Insufficient documentation

## 2024-05-01 DIAGNOSIS — I361 Nonrheumatic tricuspid (valve) insufficiency: Secondary | ICD-10-CM | POA: Diagnosis not present

## 2024-05-01 DIAGNOSIS — I4821 Permanent atrial fibrillation: Secondary | ICD-10-CM | POA: Insufficient documentation

## 2024-05-01 DIAGNOSIS — I340 Nonrheumatic mitral (valve) insufficiency: Secondary | ICD-10-CM

## 2024-05-01 DIAGNOSIS — E119 Type 2 diabetes mellitus without complications: Secondary | ICD-10-CM | POA: Diagnosis not present

## 2024-05-01 DIAGNOSIS — D500 Iron deficiency anemia secondary to blood loss (chronic): Secondary | ICD-10-CM | POA: Insufficient documentation

## 2024-05-01 DIAGNOSIS — J449 Chronic obstructive pulmonary disease, unspecified: Secondary | ICD-10-CM | POA: Insufficient documentation

## 2024-05-01 DIAGNOSIS — I5032 Chronic diastolic (congestive) heart failure: Secondary | ICD-10-CM | POA: Diagnosis not present

## 2024-05-01 DIAGNOSIS — Z01810 Encounter for preprocedural cardiovascular examination: Secondary | ICD-10-CM | POA: Insufficient documentation

## 2024-05-01 DIAGNOSIS — Z01818 Encounter for other preprocedural examination: Secondary | ICD-10-CM

## 2024-05-01 HISTORY — PX: TRANSESOPHAGEAL ECHOCARDIOGRAM (CATH LAB): EP1270

## 2024-05-01 LAB — CBC
HCT: 28.4 % — ABNORMAL LOW (ref 36.0–46.0)
Hemoglobin: 9.5 g/dL — ABNORMAL LOW (ref 12.0–15.0)
MCH: 27.7 pg (ref 26.0–34.0)
MCHC: 33.5 g/dL (ref 30.0–36.0)
MCV: 82.8 fL (ref 80.0–100.0)
Platelets: 210 K/uL (ref 150–400)
RBC: 3.43 MIL/uL — ABNORMAL LOW (ref 3.87–5.11)
RDW: 12.3 % (ref 11.5–15.5)
WBC: 7.6 K/uL (ref 4.0–10.5)
nRBC: 0 % (ref 0.0–0.2)

## 2024-05-01 LAB — IRON AND TIBC
Iron: 29 ug/dL (ref 28–170)
Saturation Ratios: 5 % — ABNORMAL LOW (ref 10.4–31.8)
TIBC: 620 ug/dL — ABNORMAL HIGH (ref 250–450)
UIBC: 591 ug/dL

## 2024-05-01 LAB — ECHO TEE

## 2024-05-01 LAB — FERRITIN: Ferritin: 13 ng/mL (ref 11–307)

## 2024-05-01 LAB — GLUCOSE, CAPILLARY: Glucose-Capillary: 95 mg/dL (ref 70–99)

## 2024-05-01 SURGERY — TRANSESOPHAGEAL ECHOCARDIOGRAM (TEE) (CATHLAB)
Anesthesia: Monitor Anesthesia Care

## 2024-05-01 MED ORDER — SODIUM CHLORIDE 0.9 % IV SOLN: INTRAVENOUS | Status: DC

## 2024-05-01 MED ORDER — PHENYLEPHRINE 80 MCG/ML (10ML) SYRINGE FOR IV PUSH (FOR BLOOD PRESSURE SUPPORT)
PREFILLED_SYRINGE | INTRAVENOUS | Status: DC | PRN
  Administered 2024-05-01 (×2): 160 ug via INTRAVENOUS

## 2024-05-01 MED ORDER — PROPOFOL 10 MG/ML IV BOLUS
INTRAVENOUS | Status: DC | PRN
  Administered 2024-05-01: 50 mg via INTRAVENOUS

## 2024-05-01 MED ORDER — PROPOFOL 500 MG/50ML IV EMUL
INTRAVENOUS | Status: DC | PRN
  Administered 2024-05-01: 120 ug/kg/min via INTRAVENOUS

## 2024-05-01 MED ORDER — LIDOCAINE 2% (20 MG/ML) 5 ML SYRINGE
INTRAMUSCULAR | Status: DC | PRN
  Administered 2024-05-01: 60 mg via INTRAVENOUS

## 2024-05-01 NOTE — Transfer of Care (Signed)
 Immediate Anesthesia Transfer of Care Note  Patient: [name]  Procedure(s) Performed: TRANSESOPHAGEAL ECHOCARDIOGRAM  Patient Location: Cath Lab  Anesthesia Type:MAC  Level of Consciousness: drowsy and responds to stimulation  Airway & Oxygen Therapy: Patient Spontanous Breathing  Post-op Assessment: Report given to RN and Post -op Vital signs reviewed and stable  Post vital signs: Reviewed and stable  Last Vitals:  Vitals Value Taken Time  BP    Temp    Pulse 69 05/01/24 12:02  Resp 14 05/01/24 12:02  SpO2 92 % 05/01/24 12:02  Vitals shown include unfiled device data.  Last Pain:  Vitals:   05/01/24 0858  TempSrc: Temporal         Complications: No notable events documented.

## 2024-05-01 NOTE — H&P (Signed)
 Cardiology Admission History and Physical   Patient ID: [name] MRN: [id number]; DOB: Oct 13, 1950   Admission date: 05/01/2024  PCP:  Rollene Almarie LABOR, MD   [facility] Providers Cardiologist:  Jerel Balding, MD  Electrophysiologist:  Fonda Kitty, MD      Chief Complaint:  AFib  Patient Profile: [name] is a 73 y.o. female with Permanent atrial fibrillation and recurrent iron  deficiency anemia due to GI bleeding who is being seen 05/01/2024 for the evaluation of eligibility for Watchman left atrial appendage occlusion.[name]  History of Present Illness: [name] has multiple cardiac problems including history of CAD (stent to PLV artery 2009), chronic HFpEF, permanent atrial fibrillation, DM2, HTN, history of pericardiocentesis for tamponade in 2020, history of recurrent GI bleeding, history of frequent falls, being evaluated for possible Watchman left atrial appendage occluder device.  She has a severe intravenous contrast allergy and we have been unable to assess her candidacy with a CT angiogram.  She is scheduled for a transesophageal echocardiogram today.   Past Medical History:  Diagnosis Date   Anemia, unspecified    Arthritis    Asthma    Atrial fibrillation with RVR (HCC)    a. on Xarelto    Bell's palsy    Facial nerve decompression in 2001   CHF (congestive heart failure) (HCC)    Chronic low back pain    COPD with asthma (HCC)    Coronary artery disease    Myoview  04/12/11 was entirely normal. ECHO 02/26/08 showed only minor abnormalities. Stenting 05/26/08 of her posterolateral branch to the left circumflex coronary artery. Used a 2.5x37mm Taxus Monorail stent.myoview  2014 was without ischemia   Diabetes mellitus    Type 2   Early cataracts, bilateral    Fatty liver    GERD (gastroesophageal reflux disease)    Glaucoma    Glaucoma    Goiter    Heart murmur    History of nuclear stress test 2012; 2014   lexiscan ; normal pattern of  perfusion; normal, low risk scan    Hyperlipidemia    Hypertension    Panic disorder    Pneumonia 2008   Polycystic ovary disease    Hysterectomy in 1982 for this   Shortness of breath dyspnea    ECHO 02/26/08 showed only minor abnormalities   Spinal stenosis    Past Surgical History:  Procedure Laterality Date   ABDOMINAL HYSTERECTOMY  1982   & BSO; for polycystic ovary disease   CARDIOVERSION N/A 12/17/2013   Procedure: CARDIOVERSION;  Surgeon: Vinie KYM Maxcy, MD;  Location: [facility] ENDOSCOPY;  Service: Cardiovascular;  Laterality: N/A;   CARDIOVERSION N/A 09/30/2015   Procedure: CARDIOVERSION;  Surgeon: Leim VEAR Moose, MD;  Location: [facility] ENDOSCOPY;  Service: Cardiovascular;  Laterality: N/A;   CARDIOVERSION N/A 06/22/2016   Procedure: CARDIOVERSION;  Surgeon: Annabella Scarce, MD;  Location: [facility] ENDOSCOPY;  Service: Cardiovascular;  Laterality: N/A;   CENTRAL LINE INSERTION  01/13/2019   Procedure: CENTRAL LINE INSERTION;  Surgeon: Cherrie Toribio SAUNDERS, MD;  Location: [facility] INVASIVE CV LAB;  Service: Cardiovascular;;   COLONOSCOPY     last 2009; Dr Donnald; due 2019   COLONOSCOPY WITH PROPOFOL  N/A 01/23/2018   Procedure: COLONOSCOPY WITH PROPOFOL ;  Surgeon: Donnald Charleston, MD;  Location: [facility] ENDOSCOPY;  Service: Endoscopy;  Laterality: N/A;   COLONOSCOPY WITH PROPOFOL  N/A 11/04/2020   Procedure: COLONOSCOPY WITH PROPOFOL ;  Surgeon: Donnald Charleston, MD;  Location: [facility] ENDOSCOPY;  Service: Endoscopy;  Laterality: N/A;  CORONARY ANGIOPLASTY  05/26/2008   Stenting of her posterolateral branch to the left circumflex coronary artery. Used a 2.5x58mm Taxus Monorail stent.   ESOPHAGOGASTRODUODENOSCOPY (EGD) WITH PROPOFOL  N/A 01/23/2018   Procedure: ESOPHAGOGASTRODUODENOSCOPY (EGD) WITH PROPOFOL ;  Surgeon: Donnald Charleston, MD;  Location: [facility] ENDOSCOPY;  Service: Endoscopy;  Laterality: N/A;   ESOPHAGOGASTRODUODENOSCOPY (EGD) WITH PROPOFOL  N/A 11/04/2020   Procedure: ESOPHAGOGASTRODUODENOSCOPY (EGD) WITH  PROPOFOL ;  Surgeon: Donnald Charleston, MD;  Location: [facility] ENDOSCOPY;  Service: Endoscopy;  Laterality: N/A;   FACIAL NERVE DECOMPRESSION  2001/2002   bells palsy    GIVENS CAPSULE STUDY N/A 11/04/2020   Procedure: GIVENS CAPSULE STUDY;  Surgeon: Donnald Charleston, MD;  Location: [facility] ENDOSCOPY;  Service: Endoscopy;  Laterality: N/A;   HOT HEMOSTASIS N/A 11/04/2020   Procedure: HOT HEMOSTASIS (ARGON PLASMA COAGULATION/BICAP);  Surgeon: Donnald Charleston, MD;  Location: [facility] ENDOSCOPY;  Service: Endoscopy;  Laterality: N/A;   LAPAROSCOPIC CHOLECYSTECTOMY  06/15/2011    Dr Gail   PERICARDIOCENTESIS N/A 01/13/2019   Procedure: PERICARDIOCENTESIS;  Surgeon: Verlin Lonni BIRCH, MD;  Location: [facility] INVASIVE CV LAB;  Service: Cardiovascular;  Laterality: N/A;   POLYPECTOMY  11/04/2020   Procedure: POLYPECTOMY;  Surgeon: Donnald Charleston, MD;  Location: [facility] ENDOSCOPY;  Service: Endoscopy;;   RIGHT AND LEFT HEART CATH N/A 01/13/2019   Procedure: RIGHT AND LEFT HEART CATH;  Surgeon: Cherrie Toribio SAUNDERS, MD;  Location: [facility] INVASIVE CV LAB;  Service: Cardiovascular;  Laterality: N/A;   TEE WITHOUT CARDIOVERSION N/A 12/17/2013   Procedure: TRANSESOPHAGEAL ECHOCARDIOGRAM (TEE);  Surgeon: Vinie KYM Maxcy, MD;  Location: [facility] ENDOSCOPY;  Service: Cardiovascular;  Laterality: N/A;  trish/ja   TOTAL HIP ARTHROPLASTY Left 09/25/2021   Procedure: LEFT TOTAL HIP ARTHROPLASTY ANTERIOR APPROACH;  Surgeon: Liam Lerner, MD;  Location: [facility] ORS;  Service: Orthopedics;  Laterality: Left;   TRANSTHORACIC ECHOCARDIOGRAM  02/26/2008   LV systolic function normal with mild conc LVH; LA mildly dilated; trace MR/TR   UPPER GI ENDOSCOPY  2009   negative     Medications Prior to Admission: Prior to Admission medications   Medication Sig Start Date End Date Taking? Authorizing Provider  albuterol  (VENTOLIN  HFA) 108 (90 Base) MCG/ACT inhaler Inhale 2 puffs into the lungs every 6 (six) hours as needed for wheezing or shortness of breath. 03/06/24   Yes Young, Clinton D, MD  Apoaequorin (PREVAGEN EXTRA STRENGTH) 20 MG CAPS Take 20 mg by mouth daily.   Yes [provider]  ASCORBIC ACID  PO Take 1 tablet by mouth daily.   Yes [provider]  benzonatate  (TESSALON ) 200 MG capsule Take 1 capsule (200 mg total) by mouth 3 (three) times daily as needed. Patient taking differently: Take 200 mg by mouth 2 (two) times daily. 04/01/24  Yes Rollene Almarie LABOR, MD  Cholecalciferol (VITAMIN D3 PO) Take 1 capsule by mouth daily.   Yes [provider]  diltiazem  (CARDIZEM  CD) 180 MG 24 hr capsule Take 1 capsule (180 mg total) by mouth daily. 07/08/23  Yes Deneise Getty, MD  ELIQUIS  5 MG TABS tablet TAKE ONE TABLET BY MOUTH TWICE DAILY 02/27/24  Yes Travoris Bushey, MD  famotidine  (PEPCID ) 20 MG tablet Take 1 tablet (20 mg total) by mouth 2 (two) times daily. Patient taking differently: Take 20 mg by mouth 2 (two) times daily as needed for heartburn or indigestion. 04/23/23  Yes Burns, Glade PARAS, MD  gabapentin  (NEURONTIN ) 100 MG capsule Take 1 capsule (100 mg total) by mouth 2 (two) times daily. Patient taking differently: Take 100 mg  by mouth daily as needed (pain). 11/07/23  Yes Rollene Almarie LABOR, MD  HYDROcodone -acetaminophen  (NORCO/VICODIN) 5-325 MG tablet Take 1 tablet by mouth daily as needed for moderate pain (pain score 4-6) (This is a 30 day supply). 02/24/24  Yes Rollene Almarie LABOR, MD  latanoprost  (XALATAN ) 0.005 % ophthalmic solution Place 1 drop into both eyes at bedtime. 02/07/21  Yes [provider]  lisinopril  (ZESTRIL ) 10 MG tablet Take 1 tablet (10 mg total) by mouth daily. 10/31/23  Yes Torian Quintero, MD  LORazepam  (ATIVAN ) 0.5 MG tablet Take 1 tablet (0.5 mg total) by mouth 2 (two) times daily. Patient taking differently: Take 0.5 mg by mouth at bedtime. 01/30/24  Yes Webb, Padonda B, FNP  metoprolol  succinate (TOPROL -XL) 100 MG 24 hr tablet Take 1 tablet (100 mg total) by mouth 2 (two) times daily.  05/17/23  Yes Rollene Almarie LABOR, MD  metoprolol  tartrate (LOPRESSOR ) 25 MG tablet TAKE 1 TABLET ONCE DAILY AS NEEDED FOR RAPID PALPITATIONS. 04/02/23  Yes Danialle Dement, MD  Multiple Minerals-Vitamins (CALCIUM  CITRATE PLUS/MAGNESIUM  PO) Take 1 tablet by mouth daily.   Yes [provider]  Multiple Vitamin (MULTIVITAMIN) tablet Take 1 tablet by mouth daily.   Yes [provider]  nystatin  (MYCOSTATIN /NYSTOP ) powder Apply topically once a week. Patient taking differently: Apply 1 Application topically daily as needed (Rash). 05/17/23  Yes Rollene Almarie LABOR, MD  nystatin  ointment (MYCOSTATIN ) Apply 1 Application topically 2 (two) times daily as needed (IRRITATION). 05/17/23  Yes Rollene Almarie LABOR, MD  PARoxetine  (PAXIL ) 10 MG tablet Take 2.5 tablets (25 mg total) by mouth daily. 05/17/23  Yes Rollene Almarie LABOR, MD  potassium chloride  SA (KLOR-CON  M) 20 MEQ tablet Take 1 tablet (20 mEq total) by mouth 2 (two) times daily. 12/12/23  Yes Demitria Hay, MD  rOPINIRole  (REQUIP ) 0.5 MG tablet Take 1 tablet (0.5 mg total) by mouth at bedtime. Patient taking differently: Take 0.5 mg by mouth at bedtime as needed (restless leg). 01/21/23  Yes Dohmeier, Dedra, MD  rosuvastatin  (CRESTOR ) 10 MG tablet TAKE ONE TABLET BY MOUTH AT BEDTIME. 04/25/23  Yes Faithann Natal, MD  senna (SENOKOT) 8.6 MG TABS Take 1 tablet by mouth at bedtime as needed for mild constipation.   Yes [provider]  sitaGLIPtin -metformin  (JANUMET ) 50-1000 MG tablet Take 1 tablet by mouth 2 (two) times daily with a meal. 05/17/23  Yes Rollene Almarie LABOR, MD  torsemide  (DEMADEX ) 20 MG tablet Take 2 tablets (40 mg total) by mouth daily. Patient taking differently: Take 40 mg by mouth 2 (two) times daily. 12/12/23  Yes Katelen Luepke, MD  traZODone  (DESYREL ) 100 MG tablet take UP TO 200 MG BY MOUTH AT bedtime FOR SLEEP Patient taking differently: Take 150 mg by mouth at bedtime. 02/11/24  Yes Camara, Amadou,  MD  umeclidinium-vilanterol (ANORO ELLIPTA ) 62.5-25 MCG/ACT AEPB Inhale 1 puff into the lungs daily. Patient taking differently: Inhale 1 puff into the lungs daily as needed (Breathing). 04/27/23  Yes Neysa Rama D, MD  Accu-Chek Softclix Lancets lancets  05/17/23   Rollene Almarie LABOR, MD  ferrous sulfate  325 (65 FE) MG tablet Take 1 tablet (325 mg total) by mouth daily with breakfast. Patient not taking: Reported on 04/30/2024 12/12/23   Roarke Marciano, MD  glucose blood (FREESTYLE LITE) test strip Use as instructed 05/17/23   Rollene Almarie LABOR, MD  triamcinolone  cream (KENALOG ) 0.1 % Apply 1 Application topically 2 (two) times daily. Patient not taking: Reported on 04/30/2024 05/17/23   Rollene Almarie  A, MD     Allergies:    Allergies  Allergen Reactions   Benadryl  [Diphenhydramine  Hcl] Other (See Comments)    Restless leg   Clopidogrel Bisulfate     Other reaction(s): stomach upset   Ioversol Other (See Comments)    Unknown   Benadryl  [Diphenhydramine ] Anxiety   Iodinated Contrast Media Palpitations and Rash    Rapid heart rate, hot Other reaction(s): rapid heartbeat  Other reaction(s): flushing    Social History:   Social History   Socioeconomic History   Marital status: Married    Spouse name: Not on file   Number of children: 2   Years of education: master's   Highest education level: Not on file  Occupational History   Occupation: Doctor, general practice    Employer: [employer]  Tobacco Use   Smoking status: Former    Current packs/day: 0.00    Average packs/day: 1.5 packs/day for 30.0 years (45.0 ttl pk-yrs)    Types: Cigarettes    Start date: 08/14/1963    Quit date: 08/13/1993    Years since quitting: 30.7   Smokeless tobacco: Never  Vaping Use   Vaping status: Never Used  Substance and Sexual Activity   Alcohol use: No   Drug use: No   Sexual activity: Not on file  Other Topics Concern   Not on file  Social History Narrative   ** Merged  History Encounter **       Social Drivers of Health   Financial Resource Strain: Not on file  Food Insecurity: No Food Insecurity (07/28/2022)   Hunger Vital Sign    Worried About Running Out of Food in the Last Year: Never true    Ran Out of Food in the Last Year: Never true  Transportation Needs: No Transportation Needs (07/28/2022)   PRAPARE - Administrator, Civil Service (Medical): No    Lack of Transportation (Non-Medical): No  Physical Activity: Not on file  Stress: Not on file  Social Connections: Not on file  Intimate Partner Violence: Not At Risk (07/28/2022)   Humiliation, Afraid, Rape, and Kick questionnaire    Fear of Current or Ex-Partner: No    Emotionally Abused: No    Physically Abused: No    Sexually Abused: No     Family History:   The patient's family history includes Cancer in her brother and sister; Colon cancer (age of onset: 63) in her brother; Colon polyps in her sister; Emphysema (age of onset: 52) in her mother; Gout in her brother; Heart attack (age of onset: 17) in her father; Hyperlipidemia in her brother; Hypertension in her brother; Pneumonia in her maternal grandmother; Ulcers in her mother. There is no history of Diabetes or Stroke.    ROS:  Please see the history of present illness.  All other ROS reviewed and negative.     Physical Exam/Data: There were no vitals filed for this visit. No intake or output data in the 24 hours ending 05/01/24 0904    04/28/2024    3:08 PM 03/23/2024    2:57 PM 12/15/2023   10:00 AM  Last 3 Weights  Weight (lbs) 169 lb 169 lb 180 lb  Weight (kg) 76.658 kg 76.658 kg 81.647 kg     There is no height or weight on file to calculate BMI.  General:  Well nourished, well developed, in no acute distress, borderline obese HEENT: normal Neck: no JVD Vascular: No carotid bruits; Distal pulses 2+ bilaterally  Cardiac:  normal S1, S2; irregular rhythm; no murmur  Lungs:  clear to auscultation bilaterally,  no wheezing, rhonchi or rales  Abd: soft, nontender, no hepatomegaly  Ext: no edema Musculoskeletal:  No deformities, BUE and BLE strength normal and equal Skin: warm and dry  Neuro:  CNs 2-12 intact, no focal abnormalities noted Psych:  Normal affect   EKG:  The ECG that was done 12/17/2023 was personally reviewed and demonstrates atrial fibrillation left anterior fascicular block  Relevant CV Studies: Most recent echocardiogram 05/20/2022  1. Left ventricular ejection fraction, by estimation, is 60 to 65%. The  left ventricle has normal function. The left ventricle has no regional  wall motion abnormalities. Left ventricular diastolic function could not  be evaluated.   2. Right ventricular systolic function is mildly reduced. The right  ventricular size is normal. Tricuspid regurgitation signal is inadequate  for assessing PA pressure.   3. Left atrial size was mildly dilated.   4. The mitral valve is normal in structure. Trivial mitral valve  regurgitation. No evidence of mitral stenosis.   5. The aortic valve is normal in structure. Aortic valve regurgitation is  mild. No aortic stenosis is present.   6. The inferior vena cava is dilated in size with >50% respiratory  variability, suggesting right atrial pressure of 8 mmHg.   Laboratory Data: High Sensitivity Troponin:  No results for input(s): TROPONINIHS in the last 720 hours.    ChemistryNo results for input(s): NA, K, CL, CO2, GLUCOSE, BUN, CREATININE, CALCIUM , MG, GFRNONAA, GFRAA, ANIONGAP in the last 168 hours.  No results for input(s): PROT, ALBUMIN , AST, ALT, ALKPHOS, BILITOT in the last 168 hours. Lipids No results for input(s): CHOL, TRIG, HDL, LABVLDL, LDLCALC, CHOLHDL in the last 168 hours. HematologyNo results for input(s): WBC, RBC, HGB, HCT, MCV, MCH, MCHC, RDW, PLT in the last 168 hours. Thyroid  No results for input(s): TSH, FREET4 in the  last 168 hours. BNPNo results for input(s): BNP, PROBNP in the last 168 hours.  DDimer No results for input(s): DDIMER in the last 168 hours.  Radiology/Studies:  No results found.   Assessment and Plan: Atrial fibrillation with need for chronic anticoagulation and recurrent problems with severe iron  deficiency anemia, here to undergo transesophageal echocardiogram to assess whether she is an adequate candidate for possible Watchman device implantation. HFpEF: Appears euvolemic today.  Very sedentary, difficult to assess functional status. Iron  deficiency anemia: A recurrent problem every time she is placed on anticoagulation.  We have not been able to identify or correct the source of bleeding.  Recent drop in hemoglobin to 8.8.  Rechecking today and will check iron  studies.  May require an iron  infusion.   Informed Consent   Shared Decision Making/Informed Consent   The risks [esophageal damage, perforation (1:10,000 risk), bleeding, pharyngeal hematoma as well as other potential complications associated with conscious sedation including aspiration, arrhythmia, respiratory failure and death], benefits (treatment guidance and diagnostic support) and alternatives of a transesophageal echocardiogram were discussed in detail with [name] and she is willing to proceed.      Risk Assessment/Risk Scores:      New York  Heart Association (NYHA) Functional Class NYHA Class II  CHA2DS2-VASc Score = 7   This indicates a 11.2% annual risk of stroke. The patient's score is based upon: CHF History: 1 HTN History: 1 Diabetes History: 1 Stroke History: 0 Vascular Disease History: 1 Age Score: 2 Gender Score: 1    Code Status: Full Code  Signed, Jerel Balding, MD  05/01/2024 9:04 AM

## 2024-05-01 NOTE — Anesthesia Preprocedure Evaluation (Signed)
 Anesthesia Evaluation  Patient identified by MRN, date of birth, ID band Patient awake    Reviewed: Allergy & Precautions, NPO status , Patient's Chart, lab work & pertinent test results, reviewed documented beta blocker date and time   Airway Mallampati: III  TM Distance: >3 FB     Dental no notable dental hx.    Pulmonary shortness of breath, asthma , pneumonia, COPD, former smoker   breath sounds clear to auscultation       Cardiovascular hypertension, + angina  + CAD and +CHF  + dysrhythmias Atrial Fibrillation + Valvular Problems/Murmurs  Rhythm:Regular Rate:Normal  IMPRESSIONS     1. Left ventricular ejection fraction, by estimation, is 60 to 65%. The  left ventricle has normal function. The left ventricle has no regional  wall motion abnormalities. Left ventricular diastolic function could not  be evaluated.   2. Right ventricular systolic function is mildly reduced. The right  ventricular size is normal. Tricuspid regurgitation signal is inadequate  for assessing PA pressure.   3. Left atrial size was mildly dilated.   4. The mitral valve is normal in structure. Trivial mitral valve  regurgitation. No evidence of mitral stenosis.   5. The aortic valve is normal in structure. Aortic valve regurgitation is  mild. No aortic stenosis is present.   6. The inferior vena cava is dilated in size with >50% respiratory  variability, suggesting right atrial pressure of 8 mmHg.      Neuro/Psych  PSYCHIATRIC DISORDERS Anxiety Depression     Neuromuscular disease    GI/Hepatic ,GERD  ,,  Endo/Other  diabetes, Type 2    Renal/GU      Musculoskeletal  (+) Arthritis ,    Abdominal   Peds  Hematology  (+) Blood dyscrasia, anemia   Anesthesia Other Findings   Reproductive/Obstetrics                              Anesthesia Physical Anesthesia Plan  ASA: 2  Anesthesia Plan: MAC   Post-op  Pain Management:    Induction: Intravenous  PONV Risk Score and Plan: 2 and Ondansetron  and Propofol  infusion  Airway Management Planned: Nasal Cannula and Natural Airway  Additional Equipment:   Intra-op Plan:   Post-operative Plan:   Informed Consent: I have reviewed the patients History and Physical, chart, labs and discussed the procedure including the risks, benefits and alternatives for the proposed anesthesia with the patient or authorized representative who has indicated his/her understanding and acceptance.     Dental advisory given  Plan Discussed with: CRNA  Anesthesia Plan Comments:          Anesthesia Quick Evaluation

## 2024-05-01 NOTE — Op Note (Signed)
 INDICATIONS: Watchman preop evaluation  PROCEDURE:   Informed consent was obtained prior to the procedure. The risks, benefits and alternatives for the procedure were discussed and the patient comprehended these risks.  Risks include, but are not limited to, cough, sore throat, vomiting, nausea, somnolence, esophageal and stomach trauma or perforation, bleeding, low blood pressure, aspiration, pneumonia, infection, trauma to the teeth and death.    During this procedure the patient was administered IV propofol  by Anesthesiology, Dr. Keneth  The transesophageal probe was inserted in the esophagus and stomach without difficulty and multiple views were obtained.  The patient was kept under observation until the patient left the procedure room.  The patient left the procedure room in stable condition.   Agitated microbubble saline contrast was not administered.  COMPLICATIONS:    There were no immediate complications.  FINDINGS:  Severe biatrial dilation. Appendage shape and measurements appropriate for Watchman device, probably 27 mm. No thrombus seen.  RECOMMENDATIONS:     Proceed with Watchman evaluation  Time Spent Directly with the Patient:   45 minutes   [name] 05/01/2024, 11:54 AM

## 2024-05-03 ENCOUNTER — Other Ambulatory Visit: Payer: Self-pay | Admitting: Internal Medicine

## 2024-05-03 ENCOUNTER — Encounter (HOSPITAL_COMMUNITY): Payer: Self-pay | Admitting: Cardiovascular Disease

## 2024-05-04 ENCOUNTER — Encounter: Payer: Self-pay | Admitting: Cardiovascular Disease

## 2024-05-04 ENCOUNTER — Other Ambulatory Visit: Payer: Self-pay

## 2024-05-04 MED ORDER — ROSUVASTATIN CALCIUM 10 MG PO TABS: 10.0000 mg | ORAL_TABLET | Freq: Every day | ORAL | 3 refills | Status: AC

## 2024-05-04 NOTE — Anesthesia Postprocedure Evaluation (Signed)
 Anesthesia Post Note  Patient: [name]  Procedure(s) Performed: TRANSESOPHAGEAL ECHOCARDIOGRAM     Patient location during evaluation: PACU Anesthesia Type: MAC Level of consciousness: awake and alert Pain management: pain level controlled Vital Signs Assessment: post-procedure vital signs reviewed and stable Respiratory status: spontaneous breathing, nonlabored ventilation, respiratory function stable and patient connected to nasal cannula oxygen Cardiovascular status: blood pressure returned to baseline and stable Postop Assessment: no apparent nausea or vomiting Anesthetic complications: no   No notable events documented.  Last Vitals:  Vitals:   05/01/24 1220 05/01/24 1224  BP: 107/60   Pulse: 66 68  Resp: 20 16  Temp:    SpO2: 95% 95%    Last Pain:  Vitals:   05/01/24 1202  TempSrc: Temporal  PainSc: 0-No pain                 [name]

## 2024-05-05 ENCOUNTER — Other Ambulatory Visit: Payer: Self-pay | Admitting: Internal Medicine

## 2024-05-05 ENCOUNTER — Telehealth: Payer: Self-pay

## 2024-05-05 ENCOUNTER — Other Ambulatory Visit: Payer: Self-pay | Admitting: Emergency Medicine

## 2024-05-05 ENCOUNTER — Other Ambulatory Visit: Payer: Self-pay

## 2024-05-05 ENCOUNTER — Encounter: Payer: Self-pay | Admitting: Internal Medicine

## 2024-05-05 DIAGNOSIS — D500 Iron deficiency anemia secondary to blood loss (chronic): Secondary | ICD-10-CM

## 2024-05-05 DIAGNOSIS — D649 Anemia, unspecified: Secondary | ICD-10-CM

## 2024-05-05 DIAGNOSIS — R296 Repeated falls: Secondary | ICD-10-CM

## 2024-05-05 DIAGNOSIS — M1611 Unilateral primary osteoarthritis, right hip: Secondary | ICD-10-CM | POA: Diagnosis not present

## 2024-05-05 DIAGNOSIS — I4819 Other persistent atrial fibrillation: Secondary | ICD-10-CM

## 2024-05-05 DIAGNOSIS — K922 Gastrointestinal hemorrhage, unspecified: Secondary | ICD-10-CM

## 2024-05-05 NOTE — Telephone Encounter (Signed)
 The patient requested to have LAAO on 06/11/2024. Scheduled her for pre-procedure check-up and labs on 05/25/2024. She was grateful for call and agreed with plan.

## 2024-05-05 NOTE — Telephone Encounter (Signed)
 Kennyth Chew, MD  Croitoru, Jerel, MD; Josue Lamarr LABOR, RN Anatomy appears amenable to closure with Watchman. Okay to proceed with scheduling.  Josh    Called patient to discuss TEE results and to schedule LAAO. Left message to call back.  Will offer the patient procedure date of 06/11/2024. Will arrange pre-procedure visit with EP APP ~2 weeks prior to procedure.

## 2024-05-05 NOTE — Telephone Encounter (Signed)
 Looks like she messaged cardiology about this as well, do you usually place her infusion orders?

## 2024-05-05 NOTE — Telephone Encounter (Signed)
 Croitoru, Jerel, MD  Davee Comer CROME, RN Can we please set her up for an iron  infusion at the [facility]? For iron  deficiency anemia.

## 2024-05-06 ENCOUNTER — Other Ambulatory Visit: Payer: Self-pay

## 2024-05-06 ENCOUNTER — Telehealth: Payer: Self-pay

## 2024-05-06 NOTE — Telephone Encounter (Signed)
 Dr. Francyne, patient will be scheduled as soon as possible.  Auth Submission: NO AUTH NEEDED Site of care: Site of care: [facility] Payer: [employer] Medication & CPT/J Code(s) submitted: Feraheme  (ferumoxytol ) [id number] Diagnosis Code:  Route of submission (phone, fax, portal):  Phone # Fax # Auth type: Buy/Bill PB Units/visits requested: 510mg  x 2 doses Reference number:  Approval from: 05/06/24 to 08/12/24

## 2024-05-08 DIAGNOSIS — M1611 Unilateral primary osteoarthritis, right hip: Secondary | ICD-10-CM | POA: Diagnosis not present

## 2024-05-09 DIAGNOSIS — M79672 Pain in left foot: Secondary | ICD-10-CM | POA: Diagnosis not present

## 2024-05-09 DIAGNOSIS — S92355A Nondisplaced fracture of fifth metatarsal bone, left foot, initial encounter for closed fracture: Secondary | ICD-10-CM | POA: Diagnosis not present

## 2024-05-09 DIAGNOSIS — W228XXA Striking against or struck by other objects, initial encounter: Secondary | ICD-10-CM | POA: Diagnosis not present

## 2024-05-11 ENCOUNTER — Ambulatory Visit: Admitting: Neurology

## 2024-05-11 ENCOUNTER — Telehealth: Payer: Self-pay

## 2024-05-11 ENCOUNTER — Inpatient Hospital Stay (HOSPITAL_COMMUNITY)
Admission: EM | Admit: 2024-05-11 | Discharge: 2024-05-14 | DRG: 309 | Disposition: A | Discharge status: Home / Self Care | Attending: Internal Medicine | Admitting: Internal Medicine

## 2024-05-11 ENCOUNTER — Emergency Department (HOSPITAL_COMMUNITY)

## 2024-05-11 ENCOUNTER — Other Ambulatory Visit: Payer: Self-pay

## 2024-05-11 ENCOUNTER — Encounter: Payer: Self-pay | Admitting: Neurology

## 2024-05-11 VITALS — BP 130/60 | HR 68 | Ht 62.0 in | Wt 168.0 lb

## 2024-05-11 DIAGNOSIS — R001 Bradycardia, unspecified: Principal | ICD-10-CM | POA: Diagnosis present

## 2024-05-11 DIAGNOSIS — G8929 Other chronic pain: Secondary | ICD-10-CM | POA: Diagnosis present

## 2024-05-11 DIAGNOSIS — K6389 Other specified diseases of intestine: Secondary | ICD-10-CM | POA: Insufficient documentation

## 2024-05-11 DIAGNOSIS — Z6830 Body mass index (BMI) 30.0-30.9, adult: Secondary | ICD-10-CM

## 2024-05-11 DIAGNOSIS — I4821 Permanent atrial fibrillation: Secondary | ICD-10-CM | POA: Diagnosis present

## 2024-05-11 DIAGNOSIS — G629 Polyneuropathy, unspecified: Secondary | ICD-10-CM | POA: Diagnosis not present

## 2024-05-11 DIAGNOSIS — M51372 Other intervertebral disc degeneration, lumbosacral region with discogenic back pain and lower extremity pain: Secondary | ICD-10-CM | POA: Diagnosis not present

## 2024-05-11 DIAGNOSIS — I5030 Unspecified diastolic (congestive) heart failure: Secondary | ICD-10-CM | POA: Diagnosis not present

## 2024-05-11 DIAGNOSIS — R009 Unspecified abnormalities of heart beat: Secondary | ICD-10-CM | POA: Diagnosis not present

## 2024-05-11 DIAGNOSIS — K760 Fatty (change of) liver, not elsewhere classified: Secondary | ICD-10-CM | POA: Diagnosis present

## 2024-05-11 DIAGNOSIS — Z79899 Other long term (current) drug therapy: Secondary | ICD-10-CM

## 2024-05-11 DIAGNOSIS — I10 Essential (primary) hypertension: Secondary | ICD-10-CM | POA: Diagnosis not present

## 2024-05-11 DIAGNOSIS — R0602 Shortness of breath: Secondary | ICD-10-CM | POA: Diagnosis not present

## 2024-05-11 DIAGNOSIS — Z83719 Family history of colon polyps, unspecified: Secondary | ICD-10-CM

## 2024-05-11 DIAGNOSIS — B961 Klebsiella pneumoniae [K. pneumoniae] as the cause of diseases classified elsewhere: Secondary | ICD-10-CM | POA: Diagnosis present

## 2024-05-11 DIAGNOSIS — E871 Hypo-osmolality and hyponatremia: Secondary | ICD-10-CM | POA: Diagnosis present

## 2024-05-11 DIAGNOSIS — J4489 Other specified chronic obstructive pulmonary disease: Secondary | ICD-10-CM | POA: Diagnosis present

## 2024-05-11 DIAGNOSIS — Z825 Family history of asthma and other chronic lower respiratory diseases: Secondary | ICD-10-CM

## 2024-05-11 DIAGNOSIS — M1990 Unspecified osteoarthritis, unspecified site: Secondary | ICD-10-CM | POA: Diagnosis present

## 2024-05-11 DIAGNOSIS — R2689 Other abnormalities of gait and mobility: Secondary | ICD-10-CM | POA: Insufficient documentation

## 2024-05-11 DIAGNOSIS — E875 Hyperkalemia: Secondary | ICD-10-CM | POA: Diagnosis not present

## 2024-05-11 DIAGNOSIS — Z87891 Personal history of nicotine dependence: Secondary | ICD-10-CM

## 2024-05-11 DIAGNOSIS — E785 Hyperlipidemia, unspecified: Secondary | ICD-10-CM | POA: Diagnosis present

## 2024-05-11 DIAGNOSIS — Z955 Presence of coronary angioplasty implant and graft: Secondary | ICD-10-CM

## 2024-05-11 DIAGNOSIS — G2581 Restless legs syndrome: Secondary | ICD-10-CM | POA: Diagnosis present

## 2024-05-11 DIAGNOSIS — M48061 Spinal stenosis, lumbar region without neurogenic claudication: Secondary | ICD-10-CM | POA: Diagnosis present

## 2024-05-11 DIAGNOSIS — I110 Hypertensive heart disease with heart failure: Secondary | ICD-10-CM | POA: Diagnosis present

## 2024-05-11 DIAGNOSIS — I959 Hypotension, unspecified: Secondary | ICD-10-CM | POA: Diagnosis not present

## 2024-05-11 DIAGNOSIS — Z23 Encounter for immunization: Secondary | ICD-10-CM

## 2024-05-11 DIAGNOSIS — I5032 Chronic diastolic (congestive) heart failure: Secondary | ICD-10-CM | POA: Diagnosis present

## 2024-05-11 DIAGNOSIS — D649 Anemia, unspecified: Secondary | ICD-10-CM | POA: Insufficient documentation

## 2024-05-11 DIAGNOSIS — Z8249 Family history of ischemic heart disease and other diseases of the circulatory system: Secondary | ICD-10-CM

## 2024-05-11 DIAGNOSIS — H409 Unspecified glaucoma: Secondary | ICD-10-CM | POA: Diagnosis present

## 2024-05-11 DIAGNOSIS — Z8349 Family history of other endocrine, nutritional and metabolic diseases: Secondary | ICD-10-CM

## 2024-05-11 DIAGNOSIS — Z808 Family history of malignant neoplasm of other organs or systems: Secondary | ICD-10-CM

## 2024-05-11 DIAGNOSIS — Z888 Allergy status to other drugs, medicaments and biological substances status: Secondary | ICD-10-CM

## 2024-05-11 DIAGNOSIS — E1165 Type 2 diabetes mellitus with hyperglycemia: Secondary | ICD-10-CM | POA: Diagnosis present

## 2024-05-11 DIAGNOSIS — I2510 Atherosclerotic heart disease of native coronary artery without angina pectoris: Secondary | ICD-10-CM | POA: Diagnosis present

## 2024-05-11 DIAGNOSIS — Z7984 Long term (current) use of oral hypoglycemic drugs: Secondary | ICD-10-CM

## 2024-05-11 DIAGNOSIS — Z800 Family history of malignant neoplasm of digestive organs: Secondary | ICD-10-CM

## 2024-05-11 DIAGNOSIS — I4811 Longstanding persistent atrial fibrillation: Secondary | ICD-10-CM | POA: Diagnosis not present

## 2024-05-11 DIAGNOSIS — N390 Urinary tract infection, site not specified: Secondary | ICD-10-CM | POA: Diagnosis present

## 2024-05-11 DIAGNOSIS — Z91041 Radiographic dye allergy status: Secondary | ICD-10-CM

## 2024-05-11 DIAGNOSIS — K219 Gastro-esophageal reflux disease without esophagitis: Secondary | ICD-10-CM | POA: Diagnosis present

## 2024-05-11 DIAGNOSIS — Z96642 Presence of left artificial hip joint: Secondary | ICD-10-CM | POA: Diagnosis present

## 2024-05-11 DIAGNOSIS — N179 Acute kidney failure, unspecified: Secondary | ICD-10-CM | POA: Diagnosis present

## 2024-05-11 DIAGNOSIS — F32A Depression, unspecified: Secondary | ICD-10-CM | POA: Diagnosis present

## 2024-05-11 DIAGNOSIS — I4891 Unspecified atrial fibrillation: Secondary | ICD-10-CM | POA: Diagnosis not present

## 2024-05-11 DIAGNOSIS — R42 Dizziness and giddiness: Secondary | ICD-10-CM | POA: Diagnosis not present

## 2024-05-11 DIAGNOSIS — E66811 Obesity, class 1: Secondary | ICD-10-CM | POA: Diagnosis present

## 2024-05-11 DIAGNOSIS — Z7901 Long term (current) use of anticoagulants: Secondary | ICD-10-CM

## 2024-05-11 NOTE — Telephone Encounter (Signed)
 I have not seen her recently and have no idea to what she is referring? I can see she went to an urgent care for foot pain were they treating her for a UTI? If so she should call there for a forgotten rx

## 2024-05-11 NOTE — Telephone Encounter (Signed)
 I do not see the cipro  on her medication list. Were you able to sent this in?

## 2024-05-11 NOTE — Telephone Encounter (Signed)
 Called and LVM for patient.

## 2024-05-11 NOTE — ED Provider Notes (Signed)
 [place] [facility] AT [facility] Provider Note   CSN: [id number] Arrival date & time: 05/11/24  2334     History Chief Complaint  Patient presents with   Bradycardia   Urinary Frequency    HPI [name] is a 73 y.o. female presenting for chief complaint of lightheadedness, fatigue and weakness. Patient states that she has been dealing with urinary symptoms since approximately June.  Was hoping it would resolve.  However symptoms only been worsening over the past week.  Today she developed lightheadedness and weakness. Notably the patient has a substantial medical history including hypertension, hyperlipidemia, diabetes.  She has a history of A-fib and has required both calcium  channel blockers and beta-blockade in the past.  Has been compliant on her medications including potassium supplementation. Denies fevers chills, nausea vomiting, [place] shortness of breath.  Patient states her back has been hurting recently slightly worse than her baseline.  Endorses a history of chronic pain in this location.   Patient's recorded medical, surgical, social, medication list and allergies were reviewed in the Snapshot window as part of the initial history.   Review of Systems   Review of Systems  Constitutional:  Positive for fatigue. Negative for chills and fever.  HENT:  Negative for ear pain and sore throat.   Eyes:  Negative for pain and visual disturbance.  Respiratory:  Negative for cough and shortness of breath.   Cardiovascular:  Positive for palpitations. Negative for chest pain.  Gastrointestinal:  Negative for abdominal pain and vomiting.  Genitourinary:  Negative for dysuria and hematuria.  Musculoskeletal:  Negative for arthralgias and back pain.  Skin:  Negative for color change and rash.  Neurological:  Positive for weakness. Negative for seizures and syncope.  All other systems reviewed and are negative.   Physical Exam Updated Vital Signs BP  (!) 129/51   Pulse 82   Temp 98.8 F (37.1 C) (Oral)   Resp 20   Ht 5' 2 (1.575 m)   Wt 76.2 kg   SpO2 95%   BMI 30.73 kg/m  Physical Exam Vitals and nursing note reviewed.  Constitutional:      General: She is not in acute distress.    Appearance: She is well-developed.  HENT:     Head: Normocephalic and atraumatic.  Eyes:     Conjunctiva/sclera: Conjunctivae normal.  Cardiovascular:     Rate and Rhythm: Normal rate and regular rhythm.     Heart sounds: No murmur heard. Pulmonary:     Effort: Pulmonary effort is normal. No respiratory distress.     Breath sounds: Normal breath sounds.  Abdominal:     General: There is no distension.     Palpations: Abdomen is soft.     Tenderness: There is no abdominal tenderness. There is no right CVA tenderness or left CVA tenderness.  Musculoskeletal:        General: No swelling or tenderness. Normal range of motion.     Cervical back: Neck supple.  Skin:    General: Skin is warm and dry.  Neurological:     General: No focal deficit present.     Mental Status: She is alert and oriented to person, place, and time. Mental status is at baseline.     Cranial Nerves: No cranial nerve deficit.      ED Course/ Medical Decision Making/ A&P Clinical Course as of 05/12/24 0426  Tue May 12, 2024  0111 Urinary sxs x 3 months worsening this week  Palpitations new today [CC]    Clinical Course User Index [CC] Jerral Meth, MD    Procedures .Critical Care  Performed by: Jerral Meth, MD Authorized by: Jerral Meth, MD   Critical care provider statement:    Critical care time (minutes):  80   Critical care was necessary to treat or prevent imminent or life-threatening deterioration of the following conditions:  Metabolic crisis and dehydration   Critical care was time spent personally by me on the following activities:  Development of treatment plan with patient or surrogate, discussions with consultants, evaluation of  patient's response to treatment, examination of patient, ordering and review of laboratory studies, ordering and review of radiographic studies, ordering and performing treatments and interventions, pulse oximetry, re-evaluation of patient's condition and review of old charts   Care discussed with: admitting provider      Medications Ordered in ED Medications  rOPINIRole  (REQUIP ) tablet 0.5 mg (0.5 mg Oral Given 05/12/24 0152)  calcium  gluconate inj 10% (1 g) URGENT USE ONLY! (1 g Intravenous Given 05/12/24 0120)  sodium zirconium cyclosilicate  (LOKELMA ) packet 10 g (10 g Oral Given 05/12/24 0137)  insulin  aspart (novoLOG ) injection 10 Units (10 Units Intravenous Given 05/12/24 0130)    And  dextrose  50 % solution 50 mL (50 mLs Intravenous Given 05/12/24 0131)  magnesium  sulfate IVPB 2 g 50 mL (0 g Intravenous Stopped 05/12/24 0316)  cefTRIAXone  (ROCEPHIN ) 1 g in sodium chloride  0.9 % 100 mL IVPB (0 g Intravenous Stopped 05/12/24 0215)  sodium chloride  0.9 % bolus 1,000 mL (1,000 mLs Intravenous New Bag/Given 05/12/24 0149)   MDM: This is 73 year old female presenting with a myriad of symptoms. Primarily she is presenting with bradycardia and lightheadedness.  EKG shows a slow atrial fibrillation slower than her prior ventricular responses.  She is symptomatic with lightheadedness and symptomatic palpitations. This is new from her baseline.  Differential includes medication abnormality, metabolic crisis, ACS, PE, pneumonia, pneumothorax. Will check lab work, chest x-ray, EKG and plan for observation on telemetry while in the emergency department.  Secondarily, she is having urinary symptoms.  It seems to be most consistent with a UTI.  Will check urine and renal ultrasound.  Reassessment: Lab work demonstrates acute hyperkalemia.  Patient also has renal insufficiency/AKI over her baseline.  Suspect this is from her untreated UTI over the last few months developing into mild pyelonephritis.  Once  hyperkalemia was identified, patient was started on departmental protocol treatment including insulin  dextrose , normal saline fluid, Lokelma , calcium  gluconate administration to stabilize the cardiac membrane.   Clinical Impression:  1. Symptomatic bradycardia      Admit   Final Clinical Impression(s) / ED Diagnoses Final diagnoses:  Symptomatic bradycardia    Rx / DC Orders ED Discharge Orders     None         Jerral Meth, MD 05/12/24 [phone]

## 2024-05-11 NOTE — ED Triage Notes (Signed)
 Pt arrives with EMS from UC for  Broke foot on Wednesday and is on narcotics for that. Took half of a dose tonight and after that started having dizziness. Went into UC and found HR 35, 12 lead junctonal bradycardic 35-45. Denies CP, sob. Had some dyspnea on walking to stretcher at UC otherwise no SOB   Also having urinary frequency and burning since the summer time that has not gone away but has gotten worse recently.  H/o afib  140/44 138/60  Cbg 177  Hr 35-45  Aox4

## 2024-05-11 NOTE — Patient Instructions (Signed)
 Fall prevention: Preventing Falls for Adults .  Understanding Your Risk for Falls Millions of people have serious injuries from falls each year. It is important to understand your risk of falling. Talk with your health care provider about your risk and what you can do to lower it. If you do have a serious fall, make sure to tell your provider. Falling once raises your risk of falling again. How can falls affect me? Serious injuries from falls are common. These include: Broken bones, such as hip fractures. Head injuries, such as traumatic brain injuries (TBI) or concussions. A fear of falling can cause you to avoid activities and stay at home. This can make your muscles weaker and raise your risk for a fall. What can increase my risk? There are a number of risk factors that increase your risk for falling. The more risk factors you have, the higher your risk of falling. Serious injuries from a fall happen most often to people who are older than 75 years old. Teenagers and young adults ages 37-29 are also at higher risk. Common risk factors include: Weakness in the lower body. Being generally weak or confused due to long-term (chronic) illness. Dizziness or balance problems. Poor vision. Medicines that cause dizziness or drowsiness. These may include: Medicines for your blood pressure, heart, anxiety, insomnia, or swelling (edema). Pain medicines. Muscle relaxants. Other risk factors include: Drinking alcohol. Having had a fall in the past. Having foot pain or wearing improper footwear. Working at a dangerous job. Having any of the following in your home: Tripping hazards, such as floor clutter or loose rugs. Poor lighting. Pets. Having dementia or memory loss. What actions can I take to lower my risk of falling?     Physical activity Stay physically fit. Do strength and balance exercises. Consider taking a regular class to build strength and balance. Yoga and tai chi are good  options. Vision Have your eyes checked every year and your prescription for glasses or contacts updated as needed. Shoes and walking aids Wear non-skid shoes. Wear shoes that have rubber soles and low heels. Do not wear high heels. Do not walk around the house in socks or slippers. Use a cane or walker as told by your provider. Home safety Attach secure railings on both sides of your stairs. Install grab bars for your bathtub, shower, and toilet. Use a non-skid mat in your bathtub or shower. Attach bath mats securely with double-sided, non-slip rug tape. Use good lighting in all rooms. Keep a flashlight near your bed. Make sure there is a clear path from your bed to the bathroom. Use night-lights. Do not use throw rugs. Make sure all carpeting is taped or tacked down securely. Remove all clutter from walkways and stairways, including extension cords. Repair uneven or broken steps and floors. Avoid walking on icy or slippery surfaces. Walk on the grass instead of on icy or slick sidewalks. Use ice melter to get rid of ice on walkways in the winter. Use a cordless phone. Questions to ask your health care provider Can you help me check my risk for a fall? Do any of my medicines make me more likely to fall? Should I take a vitamin D  supplement? What exercises can I do to improve my strength and balance? Should I make an appointment to have my vision checked? Do I need a bone density test to check for weak bones (osteoporosis)? Would it help to use a cane or a walker? Where to find more information  Centers for Disease Control and Prevention, STEAD[employer]: TonerPromos.no Community-Based Fall Prevention Programs: TonerPromos.no [employer] on Aging: BaseRingTones.pl Contact a health care provider if: You fall at home. You are afraid of falling at home. You feel weak, drowsy, or dizzy. This information is not intended to replace advice given to you by your health care provider. Make sure you discuss any  questions you have with your health care provider. Document Revised: 04/02/2022 Document Reviewed: 04/02/2022 Elsevier Patient Education  2024 [employer]. Trouble hearing or seeing. Needing to use the toilet often. This can lead to rushing and falling. Recent falls. If you've fallen in the last 3 months, you're at higher risk. Depression. Feeling sad or hopeless can affect your balance and focus. What actions can I take to prevent falls? If you or a loved one is in the hospital, you can help prevent falls in these ways: Ask about safety. Talk with a team member about fall prevention plans. Call for help if you need to get up, instead of getting up alone. The staff is there to help you. Wear non-skid shoes or slippers to help prevent slipping. Take your time when getting up. Sit on the edge of the bed for a few minutes before standing. Keep important items in reach, like the call button or phone. Use glasses and hearing aids. Wear them as told. Talk with your health care provider about any medicines that may make you dizzy or confused. Have someone stay with you in the hospital for extra support. What does the hospital staff do to help prevent falls?     Hospital staff members have many ways to help keep you safe, such as: Talking with you about your fall risk. They'll also create a plan just for you, called a personalized plan. Checking on you often to see if you need help. Some hospitals use video to keep watch and help you quickly. Having you wear an armband or placing a sign in your room to remind staff of your needs. Having alarms on your bed or chair. These will go off if you try to get up without help. Keeping your bed low and locked. Keeping the area around your bed and bathroom well-lit and clear of clutter. Having a staff member stay with you for safety, especially when you use the bathroom. Taking away unneeded equipment or tubes from your room. This lowers the risk of  tripping. Providing safety equipment such as: A gait or transfer belt. The staff may put this around your waist to help you when you get up or or when you walk. Walkers, crutches, and other walking devices for support. Low beds, or cushions on the floor next to the bed. What other actions can I take to prevent falls? Have regular check-ins with your provider or pharmacist about medicines you take. Stay active. Follow a regular exercise program to help keep your body strong and balanced. Talk with a physical therapist (PT) as told by your provider. A PT can help you learn exercises to improve your strength and balance. If you're over 64 years old: Ask your provider if you need calcium  or vitamin D  supplements. Have your eyes and hearing checked every year. Have your feet checked every year. This information is not intended to replace advice given to you by your health care provider. Make sure you discuss any questions you have with your health care provider. Document Revised: 09/05/2023 Document Reviewed: 09/05/2023 Elsevier Patient Education  2025 [employer].   Nerve Pain (  Neuropathic Pain): What to Know Nerve pain, also called neuropathic pain, happens when nerves in your body are damaged. This type of pain can make you feel more pain than usual. Even a small touch can hurt a lot. Nerve pain can last for a long time (be chronic) and can be hard to treat. The pain can be different for each person. It might: Start suddenly or slowly. Come and go as the damaged nerves heal, or it may stay the same for years. Cause stress, trouble sleeping, and make life harder. What are the causes? Many things can cause nerve damage, such as: Metabolic problems like: Diabetes. This is the most common cause. Lack of vitamins like B12. Medicines and chemicals. Nerve damage can happen from medicines that kill cancer cells (chemotherapy) or from drinking too much alcohol. Any injury that cuts, crushes, or  stretches a nerve. Compression. If a nerve gets trapped or compressed for too long, the blood supply to the nerve can be cut off. Blood vessel disease. This can cause pain by decreasing blood supply and oxygen to nerves. Autoimmune diseases like lupus or multiple sclerosis. These are diseases where the body's defense system (immune system) attacks its own nerves. Infections with germs, also called viruses, such as shingles. Diseases that are passed down through families. What increases the risk? You're more likely to get nerve pain if: You have diabetes. You smoke. You drink too much alcohol. You take certain medicines, like those for cancer or immune system problems. What are the signs or symptoms? The main symptom is pain. Nerve pain is often described as: Burning. Shock-like. Stinging. Hot or cold. Itching. Tingling. Prickling. How is this diagnosed? No single test can diagnose nerve pain. It's diagnosed based on: A physical exam and your symptoms. Your health care provider will ask you about your pain. You may be asked to use a pain scale to describe how bad your pain is. Tests to find nerve damage, like: Nerve conduction studies and EMG to check how well nerves and muscles work. Skin biopsy, which is when a small piece of skin is removed for testing. This test looks for a nerve problem called small fiber neuropathy. Imaging tests, such as: X-rays. CT scan. MRI. How is this treated? Treatment can change over time. You might need to try different treatments or a mix of them. Options include: Treating the cause such as managing diabetes or fixing vitamin levels. Stopping medicines that can cause nerve pain. Taking medicines to relieve pain. These may include: Pain medicines. Anti-seizure medicines. Antidepressant medicines. Pain-relieving patches or creams that are put on painful areas of skin. A medicine to numb the area, which can be injected as a nerve  block. Transcutaneous nerve stimulation. This uses electrical currents to block painful nerve signals. The treatment is painless. Other treatments, such as: Acupuncture. Meditation. Massage. Occupational or physical therapy. Pain management programs. Counseling. Follow these instructions at home: Medicines  Take your medicines only as told. You may need to take steps to help treat or prevent trouble pooping (constipation), such as: Taking medicines to help you poop. Eating foods high in fiber, like beans, whole grains, and fresh fruits and vegetables. Drinking more fluids as told. Ask your provider if it's safe to drive or use machines while taking your medicine. Lifestyle  Have a good support system at home. Join a chronic pain support group. Consider talking with a mental health care provider about how to cope with the pain. Do not smoke, vape, or use  nicotine or tobacco. Do not drink alcohol. General instructions Learn as much as you can about your condition. Work closely with all your providers to find the treatment plan that works best for you. Ask what things are safe for you to do at home. Exercise as told. Keep all follow-up visits. Your provider will check if the treatments are working and change them if needed. Contact a health care provider if: Your pain treatments aren't working. You're having side effects from your medicines. You feel very tired, sad, or anxious. Get help right away if: You feel like you may hurt yourself or others. You have thoughts about taking your own life. You have other thoughts or feelings that worry you. These symptoms may be an emergency. Take one of these steps right away: Go to your nearest emergency room. Call 911. Contact the Suicide & Crisis Lifeline (24/7, free and confidential): Call or text 988. Chat online at chat.NewsActor.se. For Veterans and their loved ones: Call 988 and press 1. Text the [employer] at  228-178-1919. Chat online at ReservationsList.si. This information is not intended to replace advice given to you by your health care provider. Make sure you discuss any questions you have with your health care provider. Document Revised: 10/16/2023 Document Reviewed: 10/16/2023 Elsevier Patient Education  2025 [employer].  Sciatica Rehab Ask your health care provider which exercises are safe for you. Do exercises exactly as told by your health care provider and adjust them as directed. It is normal to feel mild stretching, pulling, tightness, or discomfort as you do these exercises. Stop right away if you feel sudden pain or your pain gets worse. Do not begin these exercises until told by your health care provider. Stretching and range-of-motion exercises These exercises warm up your muscles and joints and improve the movement and flexibility of your hips and back. These exercises also help to relieve pain, numbness, and tingling. Sciatic nerve glide  Sit in a chair with your head facing down toward your chest. Place your hands behind your back. Let your shoulders slump forward. Slowly straighten one of your legs while you tilt your head back as if you are looking toward the ceiling. Only straighten your leg as far as you can without making your symptoms worse. Hold this position for __________ seconds. Slowly return your leg and head back to the starting position. Repeat with your other leg. Repeat __________ times. Complete this exercise __________ times a day. Knee to chest with hip adduction and internal rotation  Lie on your back on a firm surface with both legs straight. Bend one of your knees and move it up toward your chest until you feel a gentle stretch in your lower back and buttock. Then, move your knee toward the shoulder that is on the opposite side from your leg. This is hip adduction and internal rotation. Hold your leg in this position by holding on to the front of your  knee. Hold this position for __________ seconds. Slowly return to the starting position. Repeat with your other leg. Repeat __________ times. Complete this exercise __________ times a day. Prone extension on elbows  Lie on your abdomen on a firm surface. A bed may be too soft for this exercise. Prop yourself up on your elbows. Use your arms to help lift your chest up until you feel a gentle stretch in your abdomen and your lower back. This will place some of your body weight on your elbows. If this is uncomfortable, try  stacking pillows under your chest. Your hips should stay down, against the surface that you are lying on. Keep your hip and back muscles relaxed. Hold this position for __________ seconds. Slowly relax your upper body and return to the starting position. Repeat __________ times. Complete this exercise __________ times a day. Strengthening exercises These exercises build strength and endurance in your back. Endurance is the ability to use your muscles for a long time, even after they get tired. Pelvic tilt This exercise strengthens the muscles that lie deep in the abdomen. Lie on your back on a firm surface. Bend your knees and keep your feet flat on the surface. Tense your abdominal muscles. Tip your pelvis up toward the ceiling and flatten your lower back into the firm surface. To help with this exercise, you may place a small towel under your lower back and try to push your back into the towel. Hold this position for __________ seconds. Let your muscles relax completely before you repeat this exercise. Repeat __________ times. Complete this exercise __________ times a day. Alternating arm and leg raises  Get on your hands and knees on a firm surface. If you are on a hard floor, you may want to use padding, such as an exercise mat, to cushion your knees. Line up your arms and legs. Your hands should be directly below your shoulders, and your knees should be directly below  your hips. Lift your left leg behind you. At the same time, raise your right arm and straighten it in front of you. Do not lift your leg higher than your hip. Do not lift your arm higher than your shoulder. Keep your abdominal and back muscles tight. Keep your hips facing the ground. Do not arch your back. Keep your balance carefully, and do not hold your breath. Hold this position for __________ seconds. Slowly return to the starting position. Repeat with your right leg and your left arm. Repeat __________ times. Complete this exercise __________ times a day. Posture and body mechanics Good posture and healthy body mechanics can help to relieve stress in your body's tissues and joints. Body mechanics refers to the movements and positions of your body while you do your daily activities. Posture is part of body mechanics. Good posture means: Your spine is in its natural S-curve position (neutral). Your shoulders are pulled back slightly. Your head is not tipped forward. Follow these guidelines to improve your posture and body mechanics in your everyday activities. Standing  When standing, keep your spine neutral and your feet about hip width apart. Keep a slight bend in your knees. Your ears, shoulders, and hips should line up. When you do a task in which you stand in one place for a long time, place one foot up on a stable object that is 2-4 inches (5-10 cm) high, such as a footstool. This helps keep your spine neutral. Sitting  When sitting, keep your spine neutral and keep your feet flat on the floor. Use a footrest, if necessary, and keep your thighs parallel to the floor. Avoid rounding your shoulders, and avoid tilting your head forward. When working at a desk or a computer, keep your desk at a height where your hands are slightly lower than your elbows. Slide your chair under your desk so you are close enough to maintain good posture. When working at a computer, place your monitor at  a height where you are looking straight ahead and you do not have to tilt your head forward  or downward to look at the screen. Resting  When lying down and resting, avoid positions that are most painful for you. If you have pain with activities such as sitting, bending, stooping, or squatting, lie in a position in which your body does not bend very much. For example, avoid curling up on your side with your arms and knees near your chest (fetal position). If you have pain with activities such as standing for a long time or reaching with your arms, lie with your spine in a neutral position and bend your knees slightly. Try the following positions: Lying on your side with a pillow between your knees. Lying on your back with a pillow under your knees. Lifting  When lifting objects, keep your feet at least shoulder width apart and tighten your abdominal muscles. Bend your knees and hips and keep your spine neutral. It is important to lift using the strength of your legs, not your back. Do not lock your knees straight out. Always ask for help to lift heavy or awkward objects. This information is not intended to replace advice given to you by your health care provider. Make sure you discuss any questions you have with your health care provider. Document Revised: 11/07/2021 Document Reviewed: 11/07/2021 Elsevier Patient Education  2024 [employer].

## 2024-05-11 NOTE — Telephone Encounter (Signed)
 Copied from CRM [id number]. Topic: Clinical - Medication Question >> May 08, 2024  4:19 PM Alfonso HERO wrote: Reason for CRM: [facility] calling because patient said an Rx for UTI meds were supposed to be being sent over to them and they don't have anything for her.

## 2024-05-11 NOTE — Progress Notes (Signed)
 Provider:  Dedra Gores, MD  Primary Care Physician:  Rollene Almarie LABOR, MD [address] [place] [address]     Referring Provider: Rollene Almarie LABOR, Md [address] [place],  [place] [address]          Chief Complaint according to patient   Patient presents with:                HISTORY OF PRESENT ILLNESS:  [name] is a 73 y.o. female patient who is here for revisit 05/11/2024 for   Falls . Chief concern according to patient : I am referred here via Dr Rollene for gait instability and falls. Last seen in June 2024 for RLS.   Ms Scalisi reports she noticed the sensation in her left foot has progressively changed, now cramping and pulling up the left side toes-  tried to stand on her feet and she couldn't , she buckled on the left side, she didn't fall- but she broke her foot- unclear to me how s this happened. 05-09-2024   She felt she the toes cramped , but the sensation and spasms resolved after 10 minutes.   The feet are feeling cold, she feels always cold she reports.  When she falls she is not fainting, she is wide awake.  Atrial fib . She is expected to have watchman procedure in October 2025.She is on Eloquis , which induces often cold feeling.  Spinal problems, see dr Thayer notes. Not a surgical candidate. DM- poorly controlled - neuropathy  Had shingles in May-June 2025  and COVID in July !    Pt states she has had 5 falls in the last 5 years. Pt currently using w/c and rollator for ambulation and currently has fractured the left foot, Wednesday last week.     Fam Hx : see previous note- she is becoming a grand mother,  grandchild  expected 09-24-2024.   Social HX; see previous note. Lives still on [address], one story.   She has sold the [employer] home.    Ambulates with walker AT HOME, HERE IN A WHEELCHAIR.       [name] is a 73 y.o. female patient who is here for revisit 01/21/2023 for  RLS follow up-   atrial fib had a flair up, and she is still relaying on a walker. She has spinal degenerative disease, L5 , S1. Dr Colon.  Chief concern according to patient :   I can hardly get comfortable enough to go to sleep.   01-21-2023: Pt is well, reports RLS has slightly worsen since last visit and keeps her up at night. She also reports memory is stable.      Rv 11-27-2021; [name] is a patient with RLS and  chronic pain, and has advanced from wheelchair  to walker- she is on many, many medications that will cause hypersomnia, fatigue. Naps often. Gabapentin  was not working by itself for RLS .  Pramipexole was not effective.  She reports some daytime restless legs onset when in her recliner, and 90% of RLS manifested at night, in bed.  Pt states again that  the prescription for RLS medication at  2 mg ER was denied by insurance. Takes requip  1 mg tab allowed  3 times a day- can we finally allow this patient to go to the XR form?  She has been bothered by RLS, neuropathy and gait disorder.  Rv 06-15-2021: RV with [name], 73 year old female patient with recent hospitalization.  The patient had a fall in July 2022, a months o after last visit me . which fractured right hip and shoulder, went to [facility] and from there to Rehab, but outpatient at home. She feels her PT pushed her too hard.  She suffered a new injury in PT , she reported ,now presents in a wheelchair is in pain.  She can't sleep flat anymore, she has a new chair, recliner .  She feels bothered by her RLS, stretching , moving causes pain but she cannot suppress the urge. She has calf pain on the left shin pain and her skin looks tight,        01-12-2021: RV : 25 -year-old Caucasian and Native American patient presents for yearly medication management f/u. Overall stable.  She had a severe decline by Christmas- 2021- until March 2022. Pt states that with the RLS the 2 mg ER was denied by insurance. She has continued taking the  0.5 mg tablet and then additional one if needed (was never ordered the 1 mg dose to take between dinner and bedtime). She has noted dizziness and sleepiness that are possible side effects to medications, thankfully has not not fallen. Chronic respiratory failure. CHF. She has a history of pericardial effusion, had anemia- had transfusion, 3-31-2022_ferritin was 10.2 .  H and H was 10.4 g/ 32% - but walking was limited by swelling in feet and legs. Rectal bleeding. She is very easily SOB and her hips give out- has been limited in her walking ability. Dr Colon sent her to PT for gait. Vit B 12 is low. GFR is low. Hyponatremia is present.  FSS 51 points, Epworth at 12/ 24 years, 4/ 15 depression scale.  Paxil  may contribute to RLS and I would like to change to 10 mg for 7 days, then start Prozac. She will wait until dr Rollene speaks to her.    11-02-2019; [name] is a 73 year old Caucasian patient of Dr. Almarie Rollene, MD.  She presents today after a 45-month hiatus.  The last time I saw her she had pericardial effusions, developed headaches dizziness and had fallen with a large hematoma to the right temple.  She states now that she has not  continued to-  fall to  fall out of bed. As she reports that there was a flurry of falls out of bed in June, July and August 2020.  She has a history of chronic atrial fibrillation.  She has regained fluid weight is abdominal distention.  She has a remote history of smoking over 25 years ago she quit.  She is tachycardic here today as she checked in and her blood pressure was elevated 152/90, pulse rate was 124.  She has been responding well to the treatment of restless legs with medication.  She endorsed the Epworth Sleepiness Scale at 6 out of 24 points which is a good response, she reports a high degree of fatigue. She is trying very day to walk a little more distance.  She will see Dr. Willis next month, blood work is next week.      03-05-2019,   [name] is a  73 year old caucasian left handed patient. Presenting for urgent Visit.  Interval history for anew concern of confusion, delayed word retrieval following a fall out of bed, head injury.  She had a headache , a little nausea and about a day later  she developed headaches, dizziness and a large hematoma on the right temple.    There has been a progression in her heart failure.  Her interval history is quite remarkable patient informed me in a MyChart message that she had been admitted to [facility] on May 31 after developing increasing shortness of breath and extreme weakness.  She was found to have pulmonary edema, atrial fibrillation and pericardial effusion was evaluated for underlying malignancy.  She had almost 2 L of fluid drained from the pericardium and lives she stated.  She was in the hospital for belly a week about the third week after she returned home she had a fall out of bed.  The CT of the chest abdomen and pelvis was performed without contrast documented small amount of ascites primarily in the pelvis bilateral flank edema anasarca with bilateral pleural effusions, ascites and bilateral flank edema.  A previous CT had already described a similar small nodule to the left adrenal gland this has not changed and is likely incidental.  The previous CT was from 2003.  CT of the chest showed that the heart was mildly enlarged.there was still a small residual pericardial effusion and the mediastinum did not show enlarged lymph nodes there were pleural effusions small left more than right atelectasis in both lower lobes.  No infiltration, no pancreatic abnormality noted normal spleen, normal bowel anatomy.  The studies are from 14 January 2019. The discharge summary dated 16 January 2019 stated that the patient would be on fluid restriction she was discharged on torsemide , on Cipro , she will be followed for elevated liver function tests-transaminases, and her work-up for anemia will be  continued.  At the time there was no neurologic concerns this arose from the fall 3 weeks later.     Review of Systems: Out of a complete 14 system review, the patient complains of only the following symptoms, and all other reviewed systems are negative.:   Falls.      Social History   Socioeconomic History   Marital status: Married    Spouse name: Not on file   Number of children: 2   Years of education: master's   Highest education level: Not on file  Occupational History   Occupation: Doctor, general practice    Employer: [name]  Tobacco Use   Smoking status: Former    Current packs/day: 0.00    Average packs/day: 1.5 packs/day for 30.0 years (45.0 ttl pk-yrs)    Types: Cigarettes    Start date: 08/14/1963    Quit date: 08/13/1993    Years since quitting: 30.7   Smokeless tobacco: Never  Vaping Use   Vaping status: Never Used  Substance and Sexual Activity   Alcohol use: No   Drug use: No   Sexual activity: Not on file  Other Topics Concern   Not on file  Social History Narrative   ** Merged History Encounter **       Social Drivers of Health   Financial Resource Strain: Not on file  Food Insecurity: No Food Insecurity (07/28/2022)   Hunger Vital Sign    Worried About Running Out of Food in the Last Year: Never true    Ran Out of Food in the Last Year: Never true  Transportation Needs: No Transportation Needs (07/28/2022)   PRAPARE - Administrator, Civil Service (Medical): No    Lack of Transportation (Non-Medical): No  Physical Activity: Not on file  Stress: Not on file  Social Connections: Not on file    Family History  Problem Relation Age of Onset   Heart attack Father 57       2nd MI at 81   Colon cancer Brother 61   Gout Brother    Ulcers Mother    Emphysema Mother 62   Colon polyps Sister    Cancer Sister        Basal cell carcinoma   Pneumonia Maternal Grandmother    Hypertension Brother    Hyperlipidemia Brother     Cancer Brother        Skin   Diabetes Neg Hx    Stroke Neg Hx     Past Medical History:  Diagnosis Date   Anemia, unspecified    Arthritis    Asthma    Atrial fibrillation with RVR (HCC)    a. on Xarelto    Bell's palsy    Facial nerve decompression in 2001   CHF (congestive heart failure) (HCC)    Chronic low back pain    COPD with asthma (HCC)    Coronary artery disease    Myoview  04/12/11 was entirely normal. ECHO 02/26/08 showed only minor abnormalities. Stenting 05/26/08 of her posterolateral branch to the left circumflex coronary artery. Used a 2.5x49mm Taxus Monorail stent.myoview  2014 was without ischemia   Diabetes mellitus    Type 2   Early cataracts, bilateral    Fatty liver    GERD (gastroesophageal reflux disease)    Glaucoma    Glaucoma    Goiter    Heart murmur    History of nuclear stress test 2012; 2014   lexiscan ; normal pattern of perfusion; normal, low risk scan    Hyperlipidemia    Hypertension    Panic disorder    Pneumonia 2008   Polycystic ovary disease    Hysterectomy in 1982 for this   Shortness of breath dyspnea    ECHO 02/26/08 showed only minor abnormalities   Spinal stenosis     Past Surgical History:  Procedure Laterality Date   ABDOMINAL HYSTERECTOMY  1982   & BSO; for polycystic ovary disease   CARDIOVERSION N/A 12/17/2013   Procedure: CARDIOVERSION;  Surgeon: Vinie KYM Maxcy, MD;  Location: [facility] ENDOSCOPY;  Service: Cardiovascular;  Laterality: N/A;   CARDIOVERSION N/A 09/30/2015   Procedure: CARDIOVERSION;  Surgeon: Leim VEAR Moose, MD;  Location: [facility] ENDOSCOPY;  Service: Cardiovascular;  Laterality: N/A;   CARDIOVERSION N/A 06/22/2016   Procedure: CARDIOVERSION;  Surgeon: Annabella Scarce, MD;  Location[facility] ENDOSCOPY;  Service: Cardiovascular;  Laterality: N/A;   CENTRAL LINE INSERTION  01/13/2019   Procedure: CENTRAL LINE INSERTION;  Surgeon: Cherrie Toribio SAUNDERS, MD;  Location: [facility] INVASIVE CV LAB;  Service: Cardiovascular;;    COLONOSCOPY     last 2009; Dr Donnald; due 2019   COLONOSCOPY WITH PROPOFOL  N/A 01/23/2018   Procedure: COLONOSCOPY WITH PROPOFOL ;  Surgeon: Donnald Charleston, MD;  Location: [facility] ENDOSCOPY;  Service: Endoscopy;  Laterality: N/A;   COLONOSCOPY WITH PROPOFOL  N/A 11/04/2020   Procedure: COLONOSCOPY WITH PROPOFOL ;  Surgeon: Donnald Charleston, MD;  Location: [facility] ENDOSCOPY;  Service: Endoscopy;  Laterality: N/A;   CORONARY ANGIOPLASTY  05/26/2008   Stenting of her posterolateral branch to the left circumflex coronary artery. Used a 2.5x52mm Taxus Monorail stent.   ESOPHAGOGASTRODUODENOSCOPY (EGD) WITH PROPOFOL  N/A 01/23/2018   Procedure: ESOPHAGOGASTRODUODENOSCOPY (EGD) WITH PROPOFOL ;  Surgeon: Donnald Charleston, MD;  Location: [facility] ENDOSCOPY;  Service: Endoscopy;  Laterality: N/A;   ESOPHAGOGASTRODUODENOSCOPY (EGD) WITH PROPOFOL  N/A 11/04/2020  Procedure: ESOPHAGOGASTRODUODENOSCOPY (EGD) WITH PROPOFOL ;  Surgeon: Donnald Charleston, MD;  Location: [facility] ENDOSCOPY;  Service: Endoscopy;  Laterality: N/A;   FACIAL NERVE DECOMPRESSION  2001/2002   bells palsy    GIVENS CAPSULE STUDY N/A 11/04/2020   Procedure: GIVENS CAPSULE STUDY;  Surgeon: Donnald Charleston, MD;  Location: [facility] ENDOSCOPY;  Service: Endoscopy;  Laterality: N/A;   HOT HEMOSTASIS N/A 11/04/2020   Procedure: HOT HEMOSTASIS (ARGON PLASMA COAGULATION/BICAP);  Surgeon: Donnald Charleston, MD;  Location: [facility] ENDOSCOPY;  Service: Endoscopy;  Laterality: N/A;   LAPAROSCOPIC CHOLECYSTECTOMY  06/15/2011    Dr Gail   PERICARDIOCENTESIS N/A 01/13/2019   Procedure: PERICARDIOCENTESIS;  Surgeon: Verlin Lonni BIRCH, MD;  Location: [facility] INVASIVE CV LAB;  Service: Cardiovascular;  Laterality: N/A;   POLYPECTOMY  11/04/2020   Procedure: POLYPECTOMY;  Surgeon: Donnald Charleston, MD;  Location: [facility] ENDOSCOPY;  Service: Endoscopy;;   RIGHT AND LEFT HEART CATH N/A 01/13/2019   Procedure: RIGHT AND LEFT HEART CATH;  Surgeon: Cherrie Toribio SAUNDERS, MD;  Location: [facility] INVASIVE CV LAB;  Service:  Cardiovascular;  Laterality: N/A;   TEE WITHOUT CARDIOVERSION N/A 12/17/2013   Procedure: TRANSESOPHAGEAL ECHOCARDIOGRAM (TEE);  Surgeon: Vinie KYM Maxcy, MD;  Location: [facility] ENDOSCOPY;  Service: Cardiovascular;  Laterality: N/A;  trish/ja   TOTAL HIP ARTHROPLASTY Left 09/25/2021   Procedure: LEFT TOTAL HIP ARTHROPLASTY ANTERIOR APPROACH;  Surgeon: Liam Lerner, MD;  Location: [facility] ORS;  Service: Orthopedics;  Laterality: Left;   TRANSESOPHAGEAL ECHOCARDIOGRAM (CATH LAB) N/A 05/01/2024   Procedure: TRANSESOPHAGEAL ECHOCARDIOGRAM;  Surgeon: Francyne Headland, MD;  Location: [facility] INVASIVE CV LAB;  Service: Cardiovascular;  Laterality: N/A;   TRANSTHORACIC ECHOCARDIOGRAM  02/26/2008   LV systolic function normal with mild conc LVH; LA mildly dilated; trace MR/TR   UPPER GI ENDOSCOPY  2009   negative     Current Outpatient Medications on File Prior to Visit  Medication Sig Dispense Refill   Accu-Chek Softclix Lancets lancets  102 each 10   albuterol  (VENTOLIN  HFA) 108 (90 Base) MCG/ACT inhaler Inhale 2 puffs into the lungs every 6 (six) hours as needed for wheezing or shortness of breath. 8 g 0   Apoaequorin (PREVAGEN EXTRA STRENGTH) 20 MG CAPS Take 20 mg by mouth daily.     ASCORBIC ACID  PO Take 1 tablet by mouth daily.     benzonatate  (TESSALON ) 200 MG capsule Take 1 capsule (200 mg total) by mouth 3 (three) times daily as needed. (Patient taking differently: Take 200 mg by mouth 2 (two) times daily.) 60 capsule 3   Cholecalciferol (VITAMIN D3 PO) Take 1 capsule by mouth daily.     diltiazem  (CARDIZEM  CD) 180 MG 24 hr capsule Take 1 capsule (180 mg total) by mouth daily. 90 capsule 3   ELIQUIS  5 MG TABS tablet TAKE ONE TABLET BY MOUTH TWICE DAILY 60 tablet 5   famotidine  (PEPCID ) 20 MG tablet Take 1 tablet (20 mg total) by mouth 2 (two) times daily. (Patient taking differently: Take 20 mg by mouth 2 (two) times daily as needed for heartburn or indigestion.) 180 tablet 1   glucose blood (FREESTYLE LITE)  test strip Use as instructed 100 each 12   HYDROcodone -acetaminophen  (NORCO/VICODIN) 5-325 MG tablet Take 1 tablet by mouth daily as needed for moderate pain (pain score 4-6) (This is a 30 day supply). 30 tablet 0   latanoprost  (XALATAN ) 0.005 % ophthalmic solution Place 1 drop into both eyes at bedtime.     lisinopril  (ZESTRIL ) 10 MG tablet Take 1 tablet (10 mg total)  by mouth daily. 90 tablet 1   LORazepam  (ATIVAN ) 0.5 MG tablet Take 1 tablet (0.5 mg total) by mouth 2 (two) times daily. (Patient taking differently: Take 0.5 mg by mouth at bedtime.) 60 tablet 3   metoprolol  succinate (TOPROL -XL) 100 MG 24 hr tablet Take 1 tablet (100 mg total) by mouth 2 (two) times daily. 180 tablet 3   metoprolol  tartrate (LOPRESSOR ) 25 MG tablet TAKE 1 TABLET ONCE DAILY AS NEEDED FOR RAPID PALPITATIONS. 30 tablet 1   Multiple Minerals-Vitamins (CALCIUM  CITRATE PLUS/MAGNESIUM  PO) Take 1 tablet by mouth daily.     nystatin  (MYCOSTATIN /NYSTOP ) powder Apply topically once a week. (Patient taking differently: Apply 1 Application topically daily as needed (Rash).) 60 g 5   nystatin  ointment (MYCOSTATIN ) Apply 1 Application topically 2 (two) times daily as needed (IRRITATION). 100 g 6   PARoxetine  (PAXIL ) 10 MG tablet Take 2.5 tablets (25 mg total) by mouth daily. 225 tablet 3   potassium chloride  SA (KLOR-CON  M) 20 MEQ tablet Take 1 tablet (20 mEq total) by mouth 2 (two) times daily. 60 tablet 6   rOPINIRole  (REQUIP ) 0.5 MG tablet Take 1 tablet (0.5 mg total) by mouth at bedtime. (Patient taking differently: Take 0.5 mg by mouth at bedtime as needed (restless leg).) 90 tablet 3   rosuvastatin  (CRESTOR ) 10 MG tablet Take 1 tablet (10 mg total) by mouth at bedtime. 90 tablet 3   senna (SENOKOT) 8.6 MG TABS Take 1 tablet by mouth at bedtime as needed for mild constipation.     sitaGLIPtin -metformin  (JANUMET ) 50-1000 MG tablet Take 1 tablet by mouth 2 (two) times daily with a meal. 180 tablet 3   torsemide  (DEMADEX ) 20 MG  tablet Take 2 tablets (40 mg total) by mouth daily. (Patient taking differently: Take 40 mg by mouth 2 (two) times daily.) 60 tablet 6   traZODone  (DESYREL ) 100 MG tablet take UP TO 200 MG BY MOUTH AT bedtime FOR SLEEP (Patient taking differently: Take 150 mg by mouth at bedtime.) 180 tablet 0   triamcinolone  cream (KENALOG ) 0.1 % Apply 1 Application topically 2 (two) times daily. (Patient taking differently: Apply 1 Application topically 2 (two) times daily as needed.) 100 g 0   umeclidinium-vilanterol (ANORO ELLIPTA ) 62.5-25 MCG/ACT AEPB Inhale 1 puff into the lungs daily. (Patient taking differently: Inhale 1 puff into the lungs daily as needed (Breathing).) 60 each 5   gabapentin  (NEURONTIN ) 100 MG capsule Take 1 capsule (100 mg total) by mouth 2 (two) times daily. (Patient not taking: Reported on 05/11/2024) 60 capsule 3   No current facility-administered medications on file prior to visit.    Iron  infusions at [facility].    Allergies  Allergen Reactions   Benadryl  [Diphenhydramine  Hcl] Other (See Comments)    Restless leg   Clopidogrel Bisulfate     Other reaction(s): stomach upset   Ioversol Other (See Comments)    Unknown   Benadryl  [Diphenhydramine ] Anxiety   Iodinated Contrast Media Palpitations and Rash    Rapid heart rate, hot Other reaction(s): rapid heartbeat  Other reaction(s): flushing     DIAGNOSTIC DATA (LABS, IMAGING, TESTING) - I reviewed patient records, labs, notes, testing and imaging myself where available.  Lab Results  Component Value Date   WBC 7.6 05/01/2024   HGB 9.5 (L) 05/01/2024   HCT 28.4 (L) 05/01/2024   MCV 82.8 05/01/2024   PLT 210 05/01/2024      Component Value Date/Time   NA 121 (L) 04/22/2024 1549   K 5.0  04/22/2024 1549   CL 87 (L) 04/22/2024 1549   CO2 22 04/22/2024 1549   GLUCOSE 97 04/22/2024 1549   GLUCOSE 167 (H) 12/15/2023 1014   BUN 20 04/22/2024 1549   CREATININE 1.26 (H) 04/22/2024 1549   CREATININE 1.26 (H) 03/14/2020 1617    CALCIUM  9.0 04/22/2024 1549   PROT 6.6 12/11/2023 1524   ALBUMIN  3.9 12/11/2023 1524   AST 17 12/11/2023 1524   ALT 11 12/11/2023 1524   ALKPHOS 69 12/11/2023 1524   BILITOT 0.4 12/11/2023 1524   GFRNONAA 57 (L) 12/15/2023 1014   GFRAA 42 (L) 03/08/2020 1207   Lab Results  Component Value Date   CHOL 93 (L) 12/11/2023   HDL 37 (L) 12/11/2023   LDLCALC 34 12/11/2023   LDLDIRECT 143.4 01/23/2008   TRIG 122 12/11/2023   CHOLHDL 2.5 12/11/2023   Lab Results  Component Value Date   HGBA1C 7.0 (H) 12/11/2023   Lab Results  Component Value Date   VITAMINB12 1,179 (H) 05/19/2022   Lab Results  Component Value Date   TSH 3.020 12/11/2023    PHYSICAL EXAM:  Vitals:   05/11/24 1338  BP: 130/60  Pulse: 68   No data found. Body mass index is 30.73 kg/m.   Wt Readings from Last 3 Encounters:  05/11/24 168 lb (76.2 kg)  05/01/24 167 lb (75.8 kg)  04/28/24 169 lb (76.7 kg)     Ht Readings from Last 3 Encounters:  05/11/24 5' 2 (1.575 m)  05/01/24 5' 2 (1.575 m)  04/28/24 5' 2 (1.575 m)      General: The patient is awake, alert and appears in acute pain and distress. She injured her shoulder and hip on the left , fractured both in July 2022 as a result of a fall. Did initially well after hospital . She is in a wheelchair since she re-injured her hip and shoulder during a PT session.  She now has fallen 6 more times, and fractured the ankle, reports  hip pain,  low back pain.  Neuropathic pain and numbness, unsteadiness.  The patient is obese.   Head: Normocephalic, atraumatic. Neck is supple. Mallampati 3  neck circumference:17 . Nasal airflow intact , Retrognathia is seen.   Cardiovascular:  irregular rate and rhythm . Respiratory: Lungs are clear to auscultation. Minimal excursion.  Skin:  Without evidence of edema, or rash Trunk: BMI is elevated   . Neurologic exam : The patient is awake and alert, oriented to place and She has dysarthria.   She appears  jaundiced- .  Memory subjective  described as intact.  Attention span & concentration ability appears normal. She feels slowed, not at baseline.  Skin:  With evidence of ankle edema  in the left ankle, bandaged.     NEUROLOGIC EXAM: Pupils are equal and briskly reactive to light. Funduscopic exam deferred. Extraocular movements in vertical and horizontal planes intact and without nystagmus.  Visual fields by finger perimetry are intact.Hearing to finger rub intact.  Facial motor strength is symmetric and tongue and uvula move midline.  Shoulder shrug was symmetrical.   Motor exam:   Normal tone, muscle bulk and symmetric strength in upper extremities.  Good preserved grip strength/   ROM for left ankle, she can plantar-flex and dorsiflex, but has  pain with palpations.  Sensory: deferred.  Coordination: Finger-to-nose maneuver with evidence of ataxia, dysmetria but no  tremor.  Gait and station: Patient  came today in a wheelchair but demonstrated her ability to transfer  to an armchair.   Deep tendon reflexes: in the upper and lower extremities are symmetric and intact.  Reflexes: brisk patella on the left,  but attenuated on the right knee.  Babinski's sign is absent bilaterally.  Gait and Station: deferred.   ASSESSMENT AND PLAN :    73 y.o. year old female  here with:  Frequent falls due to spinal bone degeneration, hip fractures, ankle fracture and diabetic neuropathy, also weakness related to anemia and some depression with the new residence not being her favorite place, missing the old home. [name]     1) Diabetic neuropathy and inability to feel the surfaces she walks on  2) left leg cramping, spinal stenosis,  foraminal stenosis, and hyperreflexia; left patella , fractured left ankle.  Left hip fracture in 2022. Left limp.   3)  falls are not syncope related - she has atrial fib, she is severely anemic and she is awaiting watchman procedure to get off Eliquis .    Plan :  continue using Neurontin  100 mg capsules, take at dinner and at bedtime. For neuropathy and RLS support. Patient reports feeling affected by Requip  :   swimmy headed and dizzy.  She changed to prn use.   She has been maintained by psychiatry on lorazepam  for panic  disorder.  She takes paxil .  This can sometimes exacerbates  RLS.   Any patient with gait impairment  should be cautioned not to work at heights, or operate dangerous or heavy equipment , and avoid walking on uneven surfaces.   Anemia. :She asked if she should go for an iron  infusion- MY ANSWER IS YES!     RV  prn in 12 month/    There is no intervention possibility here.    CC: Rollene Almarie LABOR, MD .     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  There is little I can do-  RV prn in 12 months with NP.     Electronically signed by: Dedra Gores, MD 05/11/2024 1:55 PM  [facility] Board certified by The [employer] of Sleep Medicine and Diplomate of the [employer] of Sleep Medicine. Board certified In Neurology through the ABPN, Fellow of the [employer] of Neu[employer].

## 2024-05-12 ENCOUNTER — Emergency Department (HOSPITAL_COMMUNITY)

## 2024-05-12 ENCOUNTER — Ambulatory Visit

## 2024-05-12 ENCOUNTER — Encounter (HOSPITAL_COMMUNITY): Payer: Self-pay | Admitting: Student

## 2024-05-12 DIAGNOSIS — N179 Acute kidney failure, unspecified: Secondary | ICD-10-CM

## 2024-05-12 DIAGNOSIS — E875 Hyperkalemia: Secondary | ICD-10-CM

## 2024-05-12 DIAGNOSIS — E785 Hyperlipidemia, unspecified: Secondary | ICD-10-CM | POA: Diagnosis not present

## 2024-05-12 DIAGNOSIS — J4489 Other specified chronic obstructive pulmonary disease: Secondary | ICD-10-CM | POA: Diagnosis not present

## 2024-05-12 DIAGNOSIS — K760 Fatty (change of) liver, not elsewhere classified: Secondary | ICD-10-CM | POA: Diagnosis not present

## 2024-05-12 DIAGNOSIS — E871 Hypo-osmolality and hyponatremia: Secondary | ICD-10-CM | POA: Diagnosis not present

## 2024-05-12 DIAGNOSIS — N281 Cyst of kidney, acquired: Secondary | ICD-10-CM | POA: Diagnosis not present

## 2024-05-12 DIAGNOSIS — M1990 Unspecified osteoarthritis, unspecified site: Secondary | ICD-10-CM | POA: Diagnosis not present

## 2024-05-12 DIAGNOSIS — I4821 Permanent atrial fibrillation: Secondary | ICD-10-CM | POA: Diagnosis not present

## 2024-05-12 DIAGNOSIS — Z6830 Body mass index (BMI) 30.0-30.9, adult: Secondary | ICD-10-CM | POA: Diagnosis not present

## 2024-05-12 DIAGNOSIS — K219 Gastro-esophageal reflux disease without esophagitis: Secondary | ICD-10-CM | POA: Diagnosis not present

## 2024-05-12 DIAGNOSIS — I110 Hypertensive heart disease with heart failure: Secondary | ICD-10-CM | POA: Diagnosis not present

## 2024-05-12 DIAGNOSIS — I700 Atherosclerosis of aorta: Secondary | ICD-10-CM | POA: Diagnosis not present

## 2024-05-12 DIAGNOSIS — I2510 Atherosclerotic heart disease of native coronary artery without angina pectoris: Secondary | ICD-10-CM | POA: Diagnosis not present

## 2024-05-12 DIAGNOSIS — E66811 Obesity, class 1: Secondary | ICD-10-CM | POA: Diagnosis not present

## 2024-05-12 DIAGNOSIS — I5030 Unspecified diastolic (congestive) heart failure: Secondary | ICD-10-CM | POA: Diagnosis not present

## 2024-05-12 DIAGNOSIS — I10 Essential (primary) hypertension: Secondary | ICD-10-CM | POA: Diagnosis not present

## 2024-05-12 DIAGNOSIS — E1165 Type 2 diabetes mellitus with hyperglycemia: Secondary | ICD-10-CM | POA: Diagnosis not present

## 2024-05-12 DIAGNOSIS — I5032 Chronic diastolic (congestive) heart failure: Secondary | ICD-10-CM | POA: Diagnosis not present

## 2024-05-12 DIAGNOSIS — I4811 Longstanding persistent atrial fibrillation: Secondary | ICD-10-CM | POA: Diagnosis not present

## 2024-05-12 DIAGNOSIS — R001 Bradycardia, unspecified: Secondary | ICD-10-CM | POA: Diagnosis not present

## 2024-05-12 DIAGNOSIS — I517 Cardiomegaly: Secondary | ICD-10-CM | POA: Diagnosis not present

## 2024-05-12 DIAGNOSIS — H409 Unspecified glaucoma: Secondary | ICD-10-CM | POA: Diagnosis not present

## 2024-05-12 DIAGNOSIS — I4891 Unspecified atrial fibrillation: Secondary | ICD-10-CM | POA: Diagnosis not present

## 2024-05-12 DIAGNOSIS — R002 Palpitations: Secondary | ICD-10-CM | POA: Diagnosis not present

## 2024-05-12 DIAGNOSIS — N390 Urinary tract infection, site not specified: Secondary | ICD-10-CM | POA: Diagnosis not present

## 2024-05-12 DIAGNOSIS — G2581 Restless legs syndrome: Secondary | ICD-10-CM | POA: Diagnosis not present

## 2024-05-12 DIAGNOSIS — F32A Depression, unspecified: Secondary | ICD-10-CM | POA: Diagnosis not present

## 2024-05-12 DIAGNOSIS — B961 Klebsiella pneumoniae [K. pneumoniae] as the cause of diseases classified elsewhere: Secondary | ICD-10-CM | POA: Diagnosis not present

## 2024-05-12 DIAGNOSIS — G8929 Other chronic pain: Secondary | ICD-10-CM | POA: Diagnosis not present

## 2024-05-12 DIAGNOSIS — Z23 Encounter for immunization: Secondary | ICD-10-CM | POA: Diagnosis not present

## 2024-05-12 LAB — URINALYSIS, ROUTINE W REFLEX MICROSCOPIC
Glucose, UA: 100 mg/dL — AB
Ketones, ur: NEGATIVE mg/dL
Nitrite: NEGATIVE
Protein, ur: 100 mg/dL — AB
Specific Gravity, Urine: >1.03 — ABNORMAL HIGH (ref 1.005–1.030)
pH: 5 (ref 5.0–8.0)

## 2024-05-12 LAB — CBC WITH DIFFERENTIAL/PLATELET
Abs Immature Granulocytes: 0.06 K/uL (ref 0.00–0.07)
Basophils Absolute: 0.1 K/uL (ref 0.0–0.1)
Basophils Relative: 1 %
Eosinophils Absolute: 0.1 K/uL (ref 0.0–0.5)
Eosinophils Relative: 1 %
HCT: 27.2 % — ABNORMAL LOW (ref 36.0–46.0)
Hemoglobin: 9.1 g/dL — ABNORMAL LOW (ref 12.0–15.0)
Immature Granulocytes: 1 %
Lymphocytes Relative: 6 %
Lymphs Abs: 0.6 K/uL — ABNORMAL LOW (ref 0.7–4.0)
MCH: 27.9 pg (ref 26.0–34.0)
MCHC: 33.5 g/dL (ref 30.0–36.0)
MCV: 83.4 fL (ref 80.0–100.0)
Monocytes Absolute: 0.6 K/uL (ref 0.1–1.0)
Monocytes Relative: 7 %
Neutro Abs: 8 K/uL — ABNORMAL HIGH (ref 1.7–7.7)
Neutrophils Relative %: 84 %
Platelets: 240 K/uL (ref 150–400)
RBC: 3.26 MIL/uL — ABNORMAL LOW (ref 3.87–5.11)
RDW: 12.3 % (ref 11.5–15.5)
WBC: 9.4 K/uL (ref 4.0–10.5)
nRBC: 0 % (ref 0.0–0.2)

## 2024-05-12 LAB — BASIC METABOLIC PANEL WITH GFR
Anion gap: 9 (ref 5–15)
BUN: 33 mg/dL — ABNORMAL HIGH (ref 8–23)
CO2: 21 mmol/L — ABNORMAL LOW (ref 22–32)
Calcium: 8.9 mg/dL (ref 8.9–10.3)
Chloride: 91 mmol/L — ABNORMAL LOW (ref 98–111)
Creatinine, Ser: 1.61 mg/dL — ABNORMAL HIGH (ref 0.44–1.00)
GFR, Estimated: 33 mL/min — ABNORMAL LOW (ref >60)
Glucose, Bld: 189 mg/dL — ABNORMAL HIGH (ref 70–99)
Potassium: 5.7 mmol/L — ABNORMAL HIGH (ref 3.5–5.1)
Sodium: 121 mmol/L — ABNORMAL LOW (ref 135–145)

## 2024-05-12 LAB — CBG MONITORING, ED
Glucose-Capillary: 242 mg/dL — ABNORMAL HIGH (ref 70–99)
Glucose-Capillary: 160 mg/dL — ABNORMAL HIGH (ref 70–99)
Glucose-Capillary: 139 mg/dL — ABNORMAL HIGH (ref 70–99)

## 2024-05-12 LAB — TROPONIN I (HIGH SENSITIVITY)
Troponin I (High Sensitivity): 5 ng/L (ref <18)
Troponin I (High Sensitivity): 6 ng/L (ref <18)

## 2024-05-12 LAB — RENAL FUNCTION PANEL
Albumin: 3.5 g/dL (ref 3.5–5.0)
Anion gap: 7 (ref 5–15)
BUN: 29 mg/dL — ABNORMAL HIGH (ref 8–23)
CO2: 22 mmol/L (ref 22–32)
Calcium: 8.8 mg/dL — ABNORMAL LOW (ref 8.9–10.3)
Chloride: 94 mmol/L — ABNORMAL LOW (ref 98–111)
Creatinine, Ser: 1.26 mg/dL — ABNORMAL HIGH (ref 0.44–1.00)
GFR, Estimated: 45 mL/min — ABNORMAL LOW (ref >60)
Glucose, Bld: 165 mg/dL — ABNORMAL HIGH (ref 70–99)
Phosphorus: 3.7 mg/dL (ref 2.5–4.6)
Potassium: 4.9 mmol/L (ref 3.5–5.1)
Sodium: 123 mmol/L — ABNORMAL LOW (ref 135–145)

## 2024-05-12 LAB — GLUCOSE, CAPILLARY
Glucose-Capillary: 153 mg/dL — ABNORMAL HIGH (ref 70–99)
Glucose-Capillary: 149 mg/dL — ABNORMAL HIGH (ref 70–99)

## 2024-05-12 LAB — COMPREHENSIVE METABOLIC PANEL WITH GFR
ALT: 15 U/L (ref 0–44)
AST: 18 U/L (ref 15–41)
Albumin: 3.5 g/dL (ref 3.5–5.0)
Alkaline Phosphatase: 48 U/L (ref 38–126)
Anion gap: 9 (ref 5–15)
BUN: 35 mg/dL — ABNORMAL HIGH (ref 8–23)
CO2: 20 mmol/L — ABNORMAL LOW (ref 22–32)
Calcium: 8.6 mg/dL — ABNORMAL LOW (ref 8.9–10.3)
Chloride: 91 mmol/L — ABNORMAL LOW (ref 98–111)
Creatinine, Ser: 1.85 mg/dL — ABNORMAL HIGH (ref 0.44–1.00)
GFR, Estimated: 28 mL/min — ABNORMAL LOW (ref >60)
Glucose, Bld: 152 mg/dL — ABNORMAL HIGH (ref 70–99)
Potassium: 6.9 mmol/L (ref 3.5–5.1)
Sodium: 120 mmol/L — ABNORMAL LOW (ref 135–145)
Total Bilirubin: 0.4 mg/dL (ref 0.0–1.2)
Total Protein: 6.6 g/dL (ref 6.5–8.1)

## 2024-05-12 LAB — BRAIN NATRIURETIC PEPTIDE: B Natriuretic Peptide: 167.3 pg/mL — ABNORMAL HIGH (ref 0.0–100.0)

## 2024-05-12 LAB — URINALYSIS, MICROSCOPIC (REFLEX): WBC, UA: >50 WBC/hpf (ref 0–5)

## 2024-05-12 LAB — MAGNESIUM
Magnesium: 1.5 mg/dL — ABNORMAL LOW (ref 1.7–2.4)
Magnesium: 1.7 mg/dL (ref 1.7–2.4)

## 2024-05-12 MED ORDER — LORAZEPAM 0.5 MG PO TABS
0.5000 mg | ORAL_TABLET | Freq: Every day | ORAL | Status: DC
Start: 2024-05-12 — End: 2024-05-14
  Administered 2024-05-12 – 2024-05-13 (×2): 0.5 mg via ORAL
  Filled 2024-05-12 (×2): qty 1

## 2024-05-12 MED ORDER — PAROXETINE HCL 10 MG PO TABS
25.0000 mg | ORAL_TABLET | Freq: Every day | ORAL | Status: DC
  Administered 2024-05-12 – 2024-05-13 (×2): 25 mg via ORAL
  Filled 2024-05-12 (×3): qty 0.5

## 2024-05-12 MED ORDER — LATANOPROST 0.005 % OP SOLN
1.0000 [drp] | Freq: Every day | OPHTHALMIC | Status: DC
  Administered 2024-05-12 – 2024-05-13 (×2): 1 [drp] via OPHTHALMIC
  Filled 2024-05-12: qty 2.5

## 2024-05-12 MED ORDER — ENSURE PLUS HIGH PROTEIN PO LIQD
237.0000 mL | Freq: Two times a day (BID) | ORAL | Status: DC
  Administered 2024-05-13 (×2): 237 mL via ORAL

## 2024-05-12 MED ORDER — INFLUENZA VAC SPLIT HIGH-DOSE 0.5 ML IM SUSY
0.5000 mL | PREFILLED_SYRINGE | INTRAMUSCULAR | Status: AC
  Administered 2024-05-13: 0.5 mL via INTRAMUSCULAR
  Filled 2024-05-12: qty 0.5

## 2024-05-12 MED ORDER — MAGNESIUM SULFATE 2 GM/50ML IV SOLN
2.0000 g | Freq: Once | INTRAVENOUS | Status: AC
Start: 2024-05-12 — End: 2024-05-12
  Administered 2024-05-12: 2 g via INTRAVENOUS
  Filled 2024-05-12: qty 50

## 2024-05-12 MED ORDER — CALCIUM GLUCONATE 10 % IV SOLN
1.0000 g | Freq: Once | INTRAVENOUS | Status: AC
  Administered 2024-05-12: 1 g via INTRAVENOUS
  Filled 2024-05-12: qty 10

## 2024-05-12 MED ORDER — DEXTROSE 50 % IV SOLN
1.0000 | Freq: Once | INTRAVENOUS | Status: AC
  Administered 2024-05-12: 50 mL via INTRAVENOUS
  Filled 2024-05-12: qty 50

## 2024-05-12 MED ORDER — SODIUM ZIRCONIUM CYCLOSILICATE 10 G PO PACK
10.0000 g | PACK | Freq: Once | ORAL | Status: AC
  Administered 2024-05-12: 10 g via ORAL
  Filled 2024-05-12: qty 1

## 2024-05-12 MED ORDER — UMECLIDINIUM-VILANTEROL 62.5-25 MCG/ACT IN AEPB
1.0000 | INHALATION_SPRAY | Freq: Every day | RESPIRATORY_TRACT | Status: DC
Start: 2024-05-12 — End: 2024-05-14
  Administered 2024-05-13 – 2024-05-14 (×2): 1 via RESPIRATORY_TRACT
  Filled 2024-05-12: qty 14

## 2024-05-12 MED ORDER — ROPINIROLE HCL 1 MG PO TABS
0.5000 mg | ORAL_TABLET | Freq: Every day | ORAL | Status: DC
  Administered 2024-05-12: 0.5 mg via ORAL
  Filled 2024-05-12: qty 1

## 2024-05-12 MED ORDER — HYDROCODONE-ACETAMINOPHEN 5-325 MG PO TABS: 1.0000 | ORAL_TABLET | Freq: Every day | ORAL | Status: DC | PRN

## 2024-05-12 MED ORDER — SODIUM CHLORIDE 0.9 % IV SOLN
1.0000 g | INTRAVENOUS | Status: DC
  Administered 2024-05-13 – 2024-05-14 (×2): 1 g via INTRAVENOUS
  Filled 2024-05-12 (×2): qty 10

## 2024-05-12 MED ORDER — SODIUM CHLORIDE 0.9 % IV BOLUS
1000.0000 mL | Freq: Once | INTRAVENOUS | Status: AC
Start: 2024-05-12 — End: 2024-05-12
  Administered 2024-05-12: 1000 mL via INTRAVENOUS

## 2024-05-12 MED ORDER — INSULIN ASPART 100 UNIT/ML IV SOLN
10.0000 [IU] | Freq: Once | INTRAVENOUS | Status: AC
  Administered 2024-05-12: 10 [IU] via INTRAVENOUS

## 2024-05-12 MED ORDER — NICOTINE POLACRILEX 2 MG MT GUM
2.0000 mg | CHEWING_GUM | OROMUCOSAL | Status: DC | PRN
  Filled 2024-05-12 (×4): qty 1

## 2024-05-12 MED ORDER — INSULIN ASPART 100 UNIT/ML IJ SOLN
0.0000 [IU] | Freq: Three times a day (TID) | INTRAMUSCULAR | Status: DC
  Administered 2024-05-12 (×2): 2 [IU] via SUBCUTANEOUS
  Administered 2024-05-13 – 2024-05-14 (×3): 1 [IU] via SUBCUTANEOUS
  Administered 2024-05-14: 2 [IU] via SUBCUTANEOUS

## 2024-05-12 MED ORDER — TRAZODONE HCL 50 MG PO TABS
150.0000 mg | ORAL_TABLET | Freq: Every day | ORAL | Status: DC
  Administered 2024-05-12 – 2024-05-13 (×2): 150 mg via ORAL
  Filled 2024-05-12 (×3): qty 1

## 2024-05-12 MED ORDER — INSULIN ASPART 100 UNIT/ML IJ SOLN
0.0000 [IU] | Freq: Every day | INTRAMUSCULAR | Status: DC
  Administered 2024-05-13: 2 [IU] via SUBCUTANEOUS

## 2024-05-12 MED ORDER — APIXABAN 5 MG PO TABS
5.0000 mg | ORAL_TABLET | Freq: Two times a day (BID) | ORAL | Status: DC
Start: 2024-05-12 — End: 2024-05-14
  Administered 2024-05-12 – 2024-05-14 (×5): 5 mg via ORAL
  Filled 2024-05-12 (×5): qty 1

## 2024-05-12 MED ORDER — SENNOSIDES-DOCUSATE SODIUM 8.6-50 MG PO TABS: 1.0000 | ORAL_TABLET | Freq: Two times a day (BID) | ORAL | Status: DC | PRN

## 2024-05-12 MED ORDER — ROPINIROLE HCL 0.5 MG PO TABS
0.5000 mg | ORAL_TABLET | Freq: Every day | ORAL | Status: DC
  Administered 2024-05-13: 0.5 mg via ORAL
  Filled 2024-05-12: qty 1

## 2024-05-12 MED ORDER — ROPINIROLE HCL 1 MG PO TABS
0.5000 mg | ORAL_TABLET | Freq: Once | ORAL | Status: AC
  Administered 2024-05-12: 0.5 mg via ORAL
  Filled 2024-05-12: qty 1

## 2024-05-12 MED ORDER — MAGNESIUM SULFATE 2 GM/50ML IV SOLN
2.0000 g | Freq: Once | INTRAVENOUS | Status: AC
  Administered 2024-05-12: 2 g via INTRAVENOUS
  Filled 2024-05-12: qty 50

## 2024-05-12 MED ORDER — ROSUVASTATIN CALCIUM 5 MG PO TABS
10.0000 mg | ORAL_TABLET | Freq: Every day | ORAL | Status: DC
  Administered 2024-05-12 – 2024-05-13 (×2): 10 mg via ORAL
  Filled 2024-05-12 (×2): qty 2

## 2024-05-12 MED ORDER — FAMOTIDINE 20 MG PO TABS: 20.0000 mg | ORAL_TABLET | Freq: Two times a day (BID) | ORAL | Status: DC | PRN

## 2024-05-12 MED ORDER — POLYETHYLENE GLYCOL 3350 17 G PO PACK: 17.0000 g | PACK | Freq: Two times a day (BID) | ORAL | Status: DC | PRN

## 2024-05-12 MED ORDER — SODIUM CHLORIDE 0.9 % IV SOLN
1.0000 g | Freq: Once | INTRAVENOUS | Status: AC
  Administered 2024-05-12: 1 g via INTRAVENOUS
  Filled 2024-05-12: qty 10

## 2024-05-12 NOTE — Telephone Encounter (Signed)
 Called 2nd attempt lvm

## 2024-05-12 NOTE — ED Notes (Signed)
 Pt taken to ultrasound on monitors, pt denies any current dizziness. HR has been consistently in 70s.

## 2024-05-12 NOTE — ED Notes (Signed)
 Messaged pharm to verify medications

## 2024-05-12 NOTE — Progress Notes (Signed)
 Ptn admitted by Dr Kathrin earlier.  RN notified me, she wishes to be a full code.  Will change her code status to Full.   Burgess Dare MD TRH

## 2024-05-12 NOTE — ED Notes (Signed)
 MD made aware of K 6.9.

## 2024-05-12 NOTE — ED Notes (Signed)
 CCMD called.

## 2024-05-12 NOTE — Consult Note (Addendum)
 Cardiology Consultation   Patient ID: [name] MRN: [id number]; DOB: [birth year]  Admit date: 05/11/2024 Date of Consult: 05/12/2024  PCP:  Rollene Almarie LABOR, MD   [facility] Providers Cardiologist:  Jerel Balding, MD  Electrophysiologist:  Fonda Kitty, MD       Patient Profile: [name] is a 73 y.o. female with a hx of CAD s/p PCI to posterior lateral ventricular branch in 2009, HFpEF, longstanding persistent AF, T2DM, HTN, pericardiocentesis for hemothorax with tamponade in June 2020, h/o GI bleeding, recent fracture of left foot who is being seen 05/12/2024 for the evaluation of bradycardia.  History of Present Illness: Ms. Eye reports that last evening she started to have lightheadedness, for which she presented to the ED. She also endorses a few days of dysuria, which she attributes to UTI. No chest pain, SOB, PND, orthopnea, leg swelling, palpitations, pre-syncope, or syncope.    At home, she's on diltiazem  180 mg daily and metoprolol  succinate 100 mg daily for AF. She also takes K supplements.   Upon presentation, HR was in the 40s with BP 150s/50s. Labs showed Hgb 9.1 (at baseline), Na 120, K 6.9, Cr 1.85 (baseline 1.2), Mg 1.5, BNP 167, UA with >50 WBC, +LE, and +bacteria. EKG showed AF with slow ventricular response in the 40s.   She received IV insulin , calcium  gluconate, and Loklema. HR improved to 60s-70s.    Past Medical History:  Diagnosis Date   Anemia, unspecified    Arthritis    Asthma    Atrial fibrillation with RVR (HCC)    a. on Xarelto    Bell's palsy    Facial nerve decompression in 2001   CHF (congestive heart failure) (HCC)    Chronic low back pain    COPD with asthma (HCC)    Coronary artery disease    Myoview  04/12/11 was entirely normal. ECHO 02/26/08 showed only minor abnormalities. Stenting 05/26/08 of her posterolateral branch to the left circumflex coronary artery. Used a 2.5x44mm Taxus Monorail stent.myoview  2014 was  without ischemia   Diabetes mellitus    Type 2   Early cataracts, bilateral    Fatty liver    GERD (gastroesophageal reflux disease)    Glaucoma    Glaucoma    Goiter    Heart murmur    History of nuclear stress test 2012; 2014   lexiscan ; normal pattern of perfusion; normal, low risk scan    Hyperlipidemia    Hypertension    Panic disorder    Pneumonia 2008   Polycystic ovary disease    Hysterectomy in 1982 for this   Shortness of breath dyspnea    ECHO 02/26/08 showed only minor abnormalities   Spinal stenosis     Past Surgical History:  Procedure Laterality Date   ABDOMINAL HYSTERECTOMY  1982   & BSO; for polycystic ovary disease   CARDIOVERSION N/A 12/17/2013   Procedure: CARDIOVERSION;  Surgeon: Vinie KYM Maxcy, MD;  Location: [facility] ENDOSCOPY;  Service: Cardiovascular;  Laterality: N/A;   CARDIOVERSION N/A 09/30/2015   Procedure: CARDIOVERSION;  Surgeon: Leim VEAR Moose, MD;  Location: [facility] ENDOSCOPY;  Service: Cardiovascular;  Laterality: N/A;   CARDIOVERSION N/A 06/22/2016   Procedure: CARDIOVERSION;  Surgeon: Annabella Scarce, MD;  Location: [facility] ENDOSCOPY;  Service: Cardiovascular;  Laterality: N/A;   CENTRAL LINE INSERTION  01/13/2019   Procedure: CENTRAL LINE INSERTION;  Surgeon: Cherrie Toribio SAUNDERS, MD;  Location: [facility] INVASIVE CV LAB;  Service: Cardiovascular;;   COLONOSCOPY     last  2009; Dr Donnald; due 2019   COLONOSCOPY WITH PROPOFOL  N/A 01/23/2018   Procedure: COLONOSCOPY WITH PROPOFOL ;  Surgeon: Donnald Charleston, MD;  Location: [facility] ENDOSCOPY;  Service: Endoscopy;  Laterality: N/A;   COLONOSCOPY WITH PROPOFOL  N/A 11/04/2020   Procedure: COLONOSCOPY WITH PROPOFOL ;  Surgeon: Donnald Charleston, MD;  Location: [facility] ENDOSCOPY;  Service: Endoscopy;  Laterality: N/A;   CORONARY ANGIOPLASTY  05/26/2008   Stenting of her posterolateral branch to the left circumflex coronary artery. Used a 2.5x73mm Taxus Monorail stent.   ESOPHAGOGASTRODUODENOSCOPY (EGD) WITH PROPOFOL  N/A 01/23/2018    Procedure: ESOPHAGOGASTRODUODENOSCOPY (EGD) WITH PROPOFOL ;  Surgeon: Donnald Charleston, MD;  Location: [facility] ENDOSCOPY;  Service: Endoscopy;  Laterality: N/A;   ESOPHAGOGASTRODUODENOSCOPY (EGD) WITH PROPOFOL  N/A 11/04/2020   Procedure: ESOPHAGOGASTRODUODENOSCOPY (EGD) WITH PROPOFOL ;  Surgeon: Donnald Charleston, MD;  Location: [facility] ENDOSCOPY;  Service: Endoscopy;  Laterality: N/A;   FACIAL NERVE DECOMPRESSION  2001/2002   bells palsy    GIVENS CAPSULE STUDY N/A 11/04/2020   Procedure: GIVENS CAPSULE STUDY;  Surgeon: Donnald Charleston, MD;  Location: [facility] ENDOSCOPY;  Service: Endoscopy;  Laterality: N/A;   HOT HEMOSTASIS N/A 11/04/2020   Procedure: HOT HEMOSTASIS (ARGON PLASMA COAGULATION/BICAP);  Surgeon: Donnald Charleston, MD;  Location: [facility] ENDOSCOPY;  Service: Endoscopy;  Laterality: N/A;   LAPAROSCOPIC CHOLECYSTECTOMY  06/15/2011    Dr Gail   PERICARDIOCENTESIS N/A 01/13/2019   Procedure: PERICARDIOCENTESIS;  Surgeon: Verlin Lonni BIRCH, MD;  Location: [facility] INVASIVE CV LAB;  Service: Cardiovascular;  Laterality: N/A;   POLYPECTOMY  11/04/2020   Procedure: POLYPECTOMY;  Surgeon: Donnald Charleston, MD;  Location: [facility] ENDOSCOPY;  Service: Endoscopy;;   RIGHT AND LEFT HEART CATH N/A 01/13/2019   Procedure: RIGHT AND LEFT HEART CATH;  Surgeon: Cherrie Toribio SAUNDERS, MD;  Location: [facility] INVASIVE CV LAB;  Service: Cardiovascular;  Laterality: N/A;   TEE WITHOUT CARDIOVERSION N/A 12/17/2013   Procedure: TRANSESOPHAGEAL ECHOCARDIOGRAM (TEE);  Surgeon: Vinie KYM Maxcy, MD;  Location: [facility] ENDOSCOPY;  Service: Cardiovascular;  Laterality: N/A;  trish/ja   TOTAL HIP ARTHROPLASTY Left 09/25/2021   Procedure: LEFT TOTAL HIP ARTHROPLASTY ANTERIOR APPROACH;  Surgeon: Liam Lerner, MD;  Location: [facility] ORS;  Service: Orthopedics;  Laterality: Left;   TRANSESOPHAGEAL ECHOCARDIOGRAM (CATH LAB) N/A 05/01/2024   Procedure: TRANSESOPHAGEAL ECHOCARDIOGRAM;  Surgeon: Francyne Headland, MD;  Location: [facility] INVASIVE CV LAB;  Service: Cardiovascular;   Laterality: N/A;   TRANSTHORACIC ECHOCARDIOGRAM  02/26/2008   LV systolic function normal with mild conc LVH; LA mildly dilated; trace MR/TR   UPPER GI ENDOSCOPY  2009   negative     Home Medications:  Prior to Admission medications   Medication Sig Start Date End Date Taking? Authorizing Provider  Accu-Chek Softclix Lancets lancets  05/17/23   Rollene Almarie LABOR, MD  albuterol  (VENTOLIN  HFA) 108 [phone] Base) MCG/ACT inhaler Inhale 2 puffs into the lungs every 6 (six) hours as needed for wheezing or shortness of breath. 03/06/24   Young, Clinton D, MD  Apoaequorin (PREVAGEN EXTRA STRENGTH) 20 MG CAPS Take 20 mg by mouth daily.    [provider]  ASCORBIC ACID  PO Take 1 tablet by mouth daily.    [provider]  benzonatate  (TESSALON ) 200 MG capsule Take 1 capsule (200 mg total) by mouth 3 (three) times daily as needed. Patient taking differently: Take 200 mg by mouth 2 (two) times daily. 04/01/24   Rollene Almarie LABOR, MD  Cholecalciferol (VITAMIN D3 PO) Take 1 capsule by mouth daily.    [provider]  diltiazem  (CARDIZEM  CD) 180  MG 24 hr capsule Take 1 capsule (180 mg total) by mouth daily. 07/08/23   Croitoru, Mihai, MD  ELIQUIS  5 MG TABS tablet TAKE ONE TABLET BY MOUTH TWICE DAILY 02/27/24   Croitoru, Jerel, MD  famotidine  (PEPCID ) 20 MG tablet Take 1 tablet (20 mg total) by mouth 2 (two) times daily. Patient taking differently: Take 20 mg by mouth 2 (two) times daily as needed for heartburn or indigestion. 04/23/23   Geofm Glade PARAS, MD  gabapentin  (NEURONTIN ) 100 MG capsule Take 1 capsule (100 mg total) by mouth 2 (two) times daily. Patient not taking: Reported on 05/11/2024 11/07/23   Rollene Almarie LABOR, MD  glucose blood (FREESTYLE LITE) test strip Use as instructed 05/17/23   Rollene Almarie LABOR, MD  HYDROcodone -acetaminophen  (NORCO/VICODIN) 5-325 MG tablet Take 1 tablet by mouth daily as needed for moderate pain (pain score 4-6) (This is a 30 day supply).  05/06/24   Rollene Almarie LABOR, MD  latanoprost  (XALATAN ) 0.005 % ophthalmic solution Place 1 drop into both eyes at bedtime. 02/07/21   [provider]  lisinopril  (ZESTRIL ) 10 MG tablet Take 1 tablet (10 mg total) by mouth daily. 10/31/23   Croitoru, Mihai, MD  LORazepam  (ATIVAN ) 0.5 MG tablet Take 1 tablet (0.5 mg total) by mouth 2 (two) times daily. Patient taking differently: Take 0.5 mg by mouth at bedtime. 01/30/24   Webb, Padonda B, FNP  metoprolol  succinate (TOPROL -XL) 100 MG 24 hr tablet Take 1 tablet (100 mg total) by mouth 2 (two) times daily. 05/17/23   Rollene Almarie LABOR, MD  metoprolol  tartrate (LOPRESSOR ) 25 MG tablet TAKE 1 TABLET ONCE DAILY AS NEEDED FOR RAPID PALPITATIONS. 04/02/23   Croitoru, Mihai, MD  Multiple Minerals-Vitamins (CALCIUM  CITRATE PLUS/MAGNESIUM  PO) Take 1 tablet by mouth daily.    [provider]  nystatin  (MYCOSTATIN /NYSTOP ) powder Apply topically once a week. Patient taking differently: Apply 1 Application topically daily as needed (Rash). 05/17/23   Rollene Almarie LABOR, MD  nystatin  ointment (MYCOSTATIN ) Apply 1 Application topically 2 (two) times daily as needed (IRRITATION). 05/17/23   Rollene Almarie LABOR, MD  PARoxetine  (PAXIL ) 10 MG tablet Take 2.5 tablets (25 mg total) by mouth daily. 05/17/23   Rollene Almarie LABOR, MD  potassium chloride  SA (KLOR-CON  M) 20 MEQ tablet Take 1 tablet (20 mEq total) by mouth 2 (two) times daily. 12/12/23   Croitoru, Mihai, MD  rOPINIRole  (REQUIP ) 0.5 MG tablet Take 1 tablet (0.5 mg total) by mouth at bedtime. Patient taking differently: Take 0.5 mg by mouth at bedtime as needed (restless leg). 01/21/23   Dohmeier, Dedra, MD  rosuvastatin  (CRESTOR ) 10 MG tablet Take 1 tablet (10 mg total) by mouth at bedtime. 05/04/24   Croitoru, Mihai, MD  senna (SENOKOT) 8.6 MG TABS Take 1 tablet by mouth at bedtime as needed for mild constipation.    [provider]  sitaGLIPtin -metformin  (JANUMET ) 50-1000 MG  tablet Take 1 tablet by mouth 2 (two) times daily with a meal. 05/17/23   Rollene Almarie LABOR, MD  torsemide  (DEMADEX ) 20 MG tablet Take 2 tablets (40 mg total) by mouth daily. Patient taking differently: Take 40 mg by mouth 2 (two) times daily. 12/12/23   Croitoru, Mihai, MD  traZODone  (DESYREL ) 100 MG tablet take UP TO 200 MG BY MOUTH AT bedtime FOR SLEEP Patient taking differently: Take 150 mg by mouth at bedtime. 02/11/24   Camara, Amadou, MD  triamcinolone  cream (KENALOG ) 0.1 % Apply 1 Application topically 2 (two) times daily. Patient taking differently: Apply  1 Application topically 2 (two) times daily as needed. 05/17/23   Rollene Almarie LABOR, MD  umeclidinium-vilanterol (ANORO ELLIPTA ) 62.5-25 MCG/ACT AEPB Inhale 1 puff into the lungs daily. Patient taking differently: Inhale 1 puff into the lungs daily as needed (Breathing). 04/27/23   Neysa Reggy BIRCH, MD    Scheduled Meds:  rOPINIRole   0.5 mg Oral QHS   Continuous Infusions:  cefTRIAXone  (ROCEPHIN )  IV     magnesium  sulfate bolus IVPB     sodium chloride      PRN Meds:   Allergies:    Allergies  Allergen Reactions   Benadryl  [Diphenhydramine  Hcl] Other (See Comments)    Restless leg   Clopidogrel Bisulfate     Other reaction(s): stomach upset   Ioversol Other (See Comments)    Unknown   Benadryl  [Diphenhydramine ] Anxiety   Iodinated Contrast Media Palpitations and Rash    Rapid heart rate, hot Other reaction(s): rapid heartbeat  Other reaction(s): flushing    Social History:   Social History   Socioeconomic History   Marital status: Married    Spouse name: Not on file   Number of children: 2   Years of education: master's   Highest education level: Not on file  Occupational History   Occupation: Doctor, general practice    Employer: [employer]  Tobacco Use   Smoking status: Former    Current packs/day: 0.00    Average packs/day: 1.5 packs/day for 30.0 years (45.0 ttl pk-yrs)    Types: Cigarettes     Start date: 08/14/1963    Quit date: 08/13/1993    Years since quitting: 30.7   Smokeless tobacco: Never  Vaping Use   Vaping status: Never Used  Substance and Sexual Activity   Alcohol use: No   Drug use: No   Sexual activity: Not on file  Other Topics Concern   Not on file  Social History Narrative   ** Merged History Encounter **       Social Drivers of Health   Financial Resource Strain: Not on file  Food Insecurity: No Food Insecurity (07/28/2022)   Hunger Vital Sign    Worried About Running Out of Food in the Last Year: Never true    Ran Out of Food in the Last Year: Never true  Transportation Needs: No Transportation Needs (07/28/2022)   PRAPARE - Administrator, Civil Service (Medical): No    Lack of Transportation (Non-Medical): No  Physical Activity: Not on file  Stress: Not on file  Social Connections: Not on file  Intimate Partner Violence: Not At Risk (07/28/2022)   Humiliation, Afraid, Rape, and Kick questionnaire    Fear of Current or Ex-Partner: No    Emotionally Abused: No    Physically Abused: No    Sexually Abused: No    Family History:    Family History  Problem Relation Age of Onset   Heart attack Father 40       2nd MI at 42   Colon cancer Brother 6   Gout Brother    Ulcers Mother    Emphysema Mother 43   Colon polyps Sister    Cancer Sister        Basal cell carcinoma   Pneumonia Maternal Grandmother    Hypertension Brother    Hyperlipidemia Brother    Cancer Brother        Skin   Diabetes Neg Hx    Stroke Neg Hx      ROS:  Please  see the history of present illness.  All other ROS reviewed and negative.     Physical Exam/Data: Vitals:   05/11/24 2346 05/11/24 2347 05/12/24 0015 05/12/24 0030  BP:  (!) 129/38 (!) 131/30 (!) 135/49  Pulse: (!) 38  (!) 44 (!) 40  Resp: 16  18 16   Temp: 98.6 F (37 C)     TempSrc: Oral     SpO2: 96%  100% 97%  Weight:      Height:       No intake or output data in the 24 hours  ending 05/12/24 0137    05/11/2024   11:45 PM 05/11/2024    1:38 PM 05/01/2024    8:58 AM  Last 3 Weights  Weight (lbs) 168 lb 168 lb 167 lb  Weight (kg) 76.204 kg 76.204 kg 75.751 kg     Body mass index is 30.73 kg/m.  General:  Well nourished, well developed, in no acute distress HEENT: normal Neck: no JVD Vascular: Distal pulses 2+ bilaterally Cardiac:  normal S1, S2; RRR; no murmur  Lungs:  clear to auscultation bilaterally, no wheezing, rhonchi or rales  Abd: soft, nontender, no hepatomegaly  Ext: no edema Musculoskeletal:  No deformities, BUE and BLE strength normal and equal Skin: warm and dry  Neuro:  CNs 2-12 intact, no focal abnormalities noted Psych:  Normal affect   EKG:  The EKG was personally reviewed and demonstrates:  AF with slow ventricular response Telemetry:  Telemetry was personally reviewed and demonstrates:  AF with slow ventricular response improved with AF with rates in the 60-70s.  Relevant CV Studies: TEE 05/01/2024:  1. Left ventricular ejection fraction, by estimation, is 55 to 60%. The  left ventricle has normal function. The left ventricle has no regional  wall motion abnormalities. Left ventricular diastolic function could not  be evaluated.   2. Right ventricular systolic function is moderately reduced. The right  ventricular size is moderately enlarged. Tricuspid regurgitation signal is  inadequate for assessing PA pressure.   3. Left atrial appendage measurements      00 degrees----diameter 18 mm----depth 17 mm      45 degrees----diameter 18 mm----depth 14 mm      90 degrees----diameter 23 mm----depth 19 mm      135 degrees---diameter 23 mm----depth 20 mm      . Left atrial size was severely dilated. No left atrial/left atrial  appendage thrombus was detected.   4. Right atrial size was severely dilated.   5. The mitral valve is normal in structure. Mild mitral valve  regurgitation.   6. The aortic valve is tricuspid. Aortic valve  regurgitation is mild. No  aortic stenosis is present.   7. There is Moderate (Grade III) protruding plaque involving the  descending aorta.   8. The thin secundum part of the atrial septum is extensive, allowing  broad options for transseptal puncture.   9. 3D performed of the LAA and demonstrates anatomy amenable to Watchman  device deployment (probably 27 mm device).   Laboratory Data: High Sensitivity Troponin:   Recent Labs  Lab 05/11/24 2352  TROPONINIHS 5     Chemistry Recent Labs  Lab 05/11/24 2352  NA 120*  K 6.9*  CL 91*  CO2 20*  GLUCOSE 152*  BUN 35*  CREATININE 1.85*  CALCIUM  8.6*  MG 1.5*  GFRNONAA 28*  ANIONGAP 9    Recent Labs  Lab 05/11/24 2352  PROT 6.6  ALBUMIN  3.5  AST 18  ALT 15  ALKPHOS 48  BILITOT 0.4   Lipids No results for input(s): CHOL, TRIG, HDL, LABVLDL, LDLCALC, CHOLHDL in the last 168 hours.  Hematology Recent Labs  Lab 05/11/24 2352  WBC 9.4  RBC 3.26*  HGB 9.1*  HCT 27.2*  MCV 83.4  MCH 27.9  MCHC 33.5  RDW 12.3  PLT 240   Thyroid  No results for input(s): TSH, FREET4 in the last 168 hours.  BNP Recent Labs  Lab 05/11/24 2352  BNP 167.3*    DDimer No results for input(s): DDIMER in the last 168 hours.  Radiology/Studies:  DG Chest Portable 1 View Result Date: 05/12/2024 EXAM: 1 VIEW XRAY OF THE CHEST 05/12/2024 12:12:14 AM COMPARISON: Comparison with 12/15/2023 CLINICAL HISTORY: palpitations. FINDINGS: LUNGS AND PLEURA: Left basilar scarring or atelectasis. No pulmonary edema. No pleural effusion. No pneumothorax. HEART AND MEDIASTINUM: Stable cardiomegaly. Aortic atherosclerotic calcification. BONES AND SOFT TISSUES: No acute osseous abnormality. IMPRESSION: 1. No acute cardiopulmonary process. Electronically signed by: Norman Gatlin MD 05/12/2024 12:17 AM EDT RP Workstation: [id number]     Assessment and Plan: AF with slow ventricular response Presents with lightheadedness in the setting of  AF with slow ventricular response, AKI, and hyperkalemia up to 6.9 in the setting of BB, CCB, and K supplement use, consistent with BRASH syndrome. This is all in the setting of UTI. Rates have improved significantly with the hyperkalemia management; currently in the 60s-70s with significant improvement in her symptoms.  - Hold metoprolol  and diltiazem   - Hyperkalemia management as per primary  team; already received calcium  gluconate, insulin , and Lokelma  with significant improvement in HF and symptoms.   Risk Assessment/Risk Scores:         CHA2DS2-VASc Score = 7   This indicates a 11.2% annual risk of stroke. The patient's score is based upon: CHF History: 1 HTN History: 1 Diabetes History: 1 Stroke History: 0 Vascular Disease History: 1 Age Score: 2 Gender Score: 1        For questions or updates, please contact [facility] Please consult [email or web address] for contact info under      Signed, Gillian CHRISTELLA Cass, MD  05/12/2024 1:37 AM

## 2024-05-12 NOTE — ED Notes (Signed)
Pt and all belongings transported upstairs.  

## 2024-05-12 NOTE — Evaluation (Addendum)
 Occupational Therapy Evaluation Patient Details Name: [name] MRN: [id number] DOB: [birth year] Today's Date: 05/12/2024   History of Present Illness   73 y/o F presenting to ED on 9/29 with lightheadedness and SOB x1 day, found to be bradycardic. Pt also with recent fall, found to have L 5th metatarsal fx, seen at [facility] on 9/23 and cam boot was recommended, which pt declined -- preference for postop shoe which she had at home.   PMH includes PCI/CAD, A fib, COPD, DM2, chronic hypnatremia, DDD/lumbar stenosis, low back pain, anxiety, depression     Clinical Impressions Pt reports ind at baseline with ADLs and uses rollator for functional mobility, pt has been ambulating with postop shoe on LLE at home. Pt currently needing up to min A for ADLs, CGA for bed mobility and CGA for transfers with RW. Pt needing significantly incr time for short distance ambulation, and overall slow to process basic commands. Pt presenting with impairments listed below, will follow acutely. Recommend HHOT at d/c.      If plan is discharge home, recommend the following:   A little help with walking and/or transfers;A little help with bathing/dressing/bathroom;Assistance with cooking/housework;Assist for transportation;Help with stairs or ramp for entrance     Functional Status Assessment   Patient has had a recent decline in their functional status and demonstrates the ability to make significant improvements in function in a reasonable and predictable amount of time.     Equipment Recommendations   None recommended by OT     Recommendations for Other Services   PT consult     Precautions/Restrictions   Precautions Precautions: Fall Restrictions Other Position/Activity Restrictions: pt with fractured L foot, has been ambulating in post op shoe with RW (declined cam boot recommended by [facility]) though reports no formal WB precautions -- LLE postop shoe in room     Mobility Bed  Mobility Overal bed mobility: Needs Assistance Bed Mobility: Supine to Sit, Sit to Supine     Supine to sit: Contact guard Sit to supine: Contact guard assist        Transfers Overall transfer level: Needs assistance Equipment used: Rolling walker (2 wheels) Transfers: Sit to/from Stand Sit to Stand: Contact guard assist           General transfer comment: significantly incr time      Balance Overall balance assessment: Needs assistance Sitting-balance support: Feet supported Sitting balance-Leahy Scale: Good     Standing balance support: Reliant on assistive device for balance, During functional activity Standing balance-Leahy Scale: Fair Standing balance comment: static standing without AD                           ADL either performed or assessed with clinical judgement   ADL Overall ADL's : Needs assistance/impaired Eating/Feeding: Set up;Sitting   Grooming: Wash/dry hands;Standing;Set up   Upper Body Bathing: Contact guard assist;Sitting   Lower Body Bathing: Minimal assistance;Sitting/lateral leans   Upper Body Dressing : Contact guard assist;Sitting   Lower Body Dressing: Minimal assistance;Sitting/lateral leans   Toilet Transfer: Contact guard assist;Ambulation;Rolling walker (2 wheels);Regular Toilet   Toileting- Clothing Manipulation and Hygiene: Contact guard assist       Functional mobility during ADLs: Contact guard assist;Rolling walker (2 wheels)       Vision   Vision Assessment?: No apparent visual deficits     Perception Perception: Not tested       Praxis Praxis: Not tested  Pertinent Vitals/Pain Pain Assessment Pain Assessment: Faces Pain Score: 3  Faces Pain Scale: Hurts little more Pain Location: LLE Pain Descriptors / Indicators: Discomfort Pain Intervention(s): Monitored during session, Limited activity within patient's tolerance, Repositioned     Extremity/Trunk Assessment Upper Extremity  Assessment Upper Extremity Assessment: Generalized weakness   Lower Extremity Assessment Lower Extremity Assessment: Defer to PT evaluation   Cervical / Trunk Assessment Cervical / Trunk Assessment: Normal   Communication Communication Communication: No apparent difficulties   Cognition Arousal: Alert Behavior During Therapy: Flat affect Cognition: No family/caregiver present to determine baseline             OT - Cognition Comments: slowed processing, though sleepy upon arrival, needs incr tiem and cues to respond to questions asked                 Following commands: Intact       Cueing  General Comments   Cueing Techniques: Verbal cues  VSS onRA   Exercises     Shoulder Instructions      Home Living Family/patient expects to be discharged to:: Private residence Living Arrangements: Spouse/significant other;Children Available Help at Discharge: Family;Available 24 hours/day Type of Home: House Home Access: Level entry     Home Layout: One level     Bathroom Shower/Tub: Tub/shower unit         Home Equipment: Educational psychologist (4 wheels);BSC/3in1;Wheelchair - manual   Additional Comments: w/c in storage      Prior Functioning/Environment Prior Level of Function : Independent/Modified Independent             Mobility Comments: rollator in the home except in bathroom ADLs Comments: ind    OT Problem List: Decreased strength;Decreased activity tolerance;Decreased range of motion;Impaired balance (sitting and/or standing);Decreased safety awareness   OT Treatment/Interventions: Self-care/ADL training;Therapeutic exercise;Energy conservation;DME and/or AE instruction;Therapeutic activities;Balance training;Patient/family education      OT Goals(Current goals can be found in the care plan section)   Acute Rehab OT Goals Patient Stated Goal: none stated OT Goal Formulation: With patient Time For Goal Achievement:  05/26/24 Potential to Achieve Goals: Good ADL Goals Pt Will Perform Upper Body Dressing: with supervision;sitting Pt Will Perform Lower Body Dressing: with supervision;sitting/lateral leans;sit to/from stand Pt Will Transfer to Toilet: with supervision;ambulating;regular height toilet Pt Will Perform Tub/Shower Transfer: Tub transfer;Shower transfer;with supervision;ambulating   OT Frequency:  Min 2X/week    Co-evaluation              AM-PAC OT 6 Clicks Daily Activity     Outcome Measure Help from another person eating meals?: A Little Help from another person taking care of personal grooming?: A Little Help from another person toileting, which includes using toliet, bedpan, or urinal?: A Little Help from another person bathing (including washing, rinsing, drying)?: A Little Help from another person to put on and taking off regular upper body clothing?: A Little Help from another person to put on and taking off regular lower body clothing?: A Little 6 Click Score: 18   End of Session Equipment Utilized During Treatment: Rolling walker (2 wheels) Nurse Communication: Mobility status  Activity Tolerance: Patient tolerated treatment well Patient left: in bed;with bed alarm set;with call bell/phone within reach  OT Visit Diagnosis: Unsteadiness on feet (R26.81);Other abnormalities of gait and mobility (R26.89);Muscle weakness (generalized) (M62.81)                Time: 8459-8392 OT Time Calculation (min): 27 min Charges:  OT General Charges $  OT Visit: 1 Visit OT Evaluation $OT Eval Low Complexity: 1 Low OT Treatments $Self Care/Home Management : 8-22 mins  Nachelle Negrette K, OTD, OTR/L [employer] Preferred Acute Rehab [phone]   Laneta MARLA Pereyra 05/12/2024, 4:46 PM

## 2024-05-12 NOTE — Progress Notes (Signed)
 Pt.and friend educated on importance of pt.not getting up or sitting on side of bed without calling or supervision.

## 2024-05-12 NOTE — ED Notes (Signed)
 Pt was able to transfer from bedside commode to stretcher and back however had SOB with exertion. Pt reports baseline SOB with exertion but not this bad. H/o COPD

## 2024-05-12 NOTE — ED Notes (Signed)
 Pt urinated into bedside commode with stand by assist.

## 2024-05-12 NOTE — H&P (Addendum)
 History and Physical    Patient: [name] FMW:[id number] DOB: [birth year] DOA: 05/11/2024 DOS: the patient was seen and examined on 05/12/2024 PCP: Rollene Almarie LABOR, MD  Patient coming from: Home.  Lives with husband.  Uses rollator at baseline.  Chief Complaint:  Chief Complaint  Patient presents with   Bradycardia   Urinary Frequency   HPI: [name] is a 72 y.o. female with PMH of CAD/PCI, HFpEF, persistent A-fib on Eliquis , COPD, DM-2, chronic hyponatremia, anemia, DDD/lumbar stenosis/lower back pain, anxiety and depression presenting with lightheadedness and shortness of breath since early last night.  Patient reports lightheadedness with shortness of breath that has started about 7 PM last night.  No chest pain.  She reports intermittent dry cough.  Also reports difficulty urinating.  She denies pain with urination or frequency but urgency to urinate.  This been going on for about 2 weeks but resolved this morning.  She denies fever, nausea, vomiting, abdominal pain or new back pain.  She denies palpitation, focal weakness, numbness or tingling.  She denies diarrhea, constipation, melena or hematochezia.  Patient lives with husband.  Uses rollator at baseline.  Denies smoking cigarette, drinking alcohol recreational drug use.  She says she has DNR.  In ED, bradycardic to 38 but improved to 70s.  Initially BP 129/38 but improved to 165/57. Na 120.  K6.9.  Creatinine 1.85 (baseline 1.2).  Mg 1.5.  Troponin negative x 2.  BNP 165.  EKG featured A-fib with bradycardia to 41.  Hgb 9.1 (baseline).  UA concerning for UTI.  CXR and renal ultrasound without significant finding.  Received IV calcium  gluconate, dextrose , insulin  and Lokelma  for hyperkalemia.  Also started on IV fluid.  Cardiology consulted.  Admission accepted by my colleague from overnight for AKI, hyperkalemia, symptomatic bradycardia.    Review of Systems: As mentioned in the history of present illness. All other  systems reviewed and are negative. Past Medical History:  Diagnosis Date   Anemia, unspecified    Arthritis    Asthma    Atrial fibrillation with RVR (HCC)    a. on Xarelto    Bell's palsy    Facial nerve decompression in 2001   CHF (congestive heart failure) (HCC)    Chronic low back pain    COPD with asthma (HCC)    Coronary artery disease    Myoview  04/12/11 was entirely normal. ECHO 02/26/08 showed only minor abnormalities. Stenting 05/26/08 of her posterolateral branch to the left circumflex coronary artery. Used a 2.5x43mm Taxus Monorail stent.myoview  2014 was without ischemia   Diabetes mellitus    Type 2   Early cataracts, bilateral    Fatty liver    GERD (gastroesophageal reflux disease)    Glaucoma    Glaucoma    Goiter    Heart murmur    History of nuclear stress test 2012; 2014   lexiscan ; normal pattern of perfusion; normal, low risk scan    Hyperlipidemia    Hypertension    Panic disorder    Pneumonia 2008   Polycystic ovary disease    Hysterectomy in 1982 for this   Shortness of breath dyspnea    ECHO 02/26/08 showed only minor abnormalities   Spinal stenosis    Past Surgical History:  Procedure Laterality Date   ABDOMINAL HYSTERECTOMY  1982   & BSO; for polycystic ovary disease   CARDIOVERSION N/A 12/17/2013   Procedure: CARDIOVERSION;  Surgeon: Vinie KYM Maxcy, MD;  Location: [facility] ENDOSCOPY;  Service: Cardiovascular;  Laterality:  N/A;   CARDIOVERSION N/A 09/30/2015   Procedure: CARDIOVERSION;  Surgeon: Leim VEAR Moose, MD;  Location: [facility] ENDOSCOPY;  Service: Cardiovascular;  Laterality: N/A;   CARDIOVERSION N/A 06/22/2016   Procedure: CARDIOVERSION;  Surgeon: Annabella Scarce, MD;  Location: [facility] ENDOSCOPY;  Service: Cardiovascular;  Laterality: N/A;   CENTRAL LINE INSERTION  01/13/2019   Procedure: CENTRAL LINE INSERTION;  Surgeon: Cherrie Toribio SAUNDERS, MD;  Location: [facility] INVASIVE CV LAB;  Service: Cardiovascular;;   COLONOSCOPY     last 2009; Dr Donnald; due  2019   COLONOSCOPY WITH PROPOFOL  N/A 01/23/2018   Procedure: COLONOSCOPY WITH PROPOFOL ;  Surgeon: Donnald Charleston, MD;  Location: [facility] ENDOSCOPY;  Service: Endoscopy;  Laterality: N/A;   COLONOSCOPY WITH PROPOFOL  N/A 11/04/2020   Procedure: COLONOSCOPY WITH PROPOFOL ;  Surgeon: Donnald Charleston, MD;  Location: [facility] ENDOSCOPY;  Service: Endoscopy;  Laterality: N/A;   CORONARY ANGIOPLASTY  05/26/2008   Stenting of her posterolateral branch to the left circumflex coronary artery. Used a 2.5x1mm Taxus Monorail stent.   ESOPHAGOGASTRODUODENOSCOPY (EGD) WITH PROPOFOL  N/A 01/23/2018   Procedure: ESOPHAGOGASTRODUODENOSCOPY (EGD) WITH PROPOFOL ;  Surgeon: Donnald Charleston, MD;  Location: [facility] ENDOSCOPY;  Service: Endoscopy;  Laterality: N/A;   ESOPHAGOGASTRODUODENOSCOPY (EGD) WITH PROPOFOL  N/A 11/04/2020   Procedure: ESOPHAGOGASTRODUODENOSCOPY (EGD) WITH PROPOFOL ;  Surgeon: Donnald Charleston, MD;  Location: [facility] ENDOSCOPY;  Service: Endoscopy;  Laterality: N/A;   FACIAL NERVE DECOMPRESSION  2001/2002   bells palsy    GIVENS CAPSULE STUDY N/A 11/04/2020   Procedure: GIVENS CAPSULE STUDY;  Surgeon: Donnald Charleston, MD;  Location: [facility] ENDOSCOPY;  Service: Endoscopy;  Laterality: N/A;   HOT HEMOSTASIS N/A 11/04/2020   Procedure: HOT HEMOSTASIS (ARGON PLASMA COAGULATION/BICAP);  Surgeon: Donnald Charleston, MD;  Location: [facility] ENDOSCOPY;  Service: Endoscopy;  Laterality: N/A;   LAPAROSCOPIC CHOLECYSTECTOMY  06/15/2011    Dr Gail   PERICARDIOCENTESIS N/A 01/13/2019   Procedure: PERICARDIOCENTESIS;  Surgeon: Verlin Lonni BIRCH, MD;  Location: [facility] INVASIVE CV LAB;  Service: Cardiovascular;  Laterality: N/A;   POLYPECTOMY  11/04/2020   Procedure: POLYPECTOMY;  Surgeon: Donnald Charleston, MD;  Location: [facility] ENDOSCOPY;  Service: Endoscopy;;   RIGHT AND LEFT HEART CATH N/A 01/13/2019   Procedure: RIGHT AND LEFT HEART CATH;  Surgeon: Cherrie Toribio SAUNDERS, MD;  Location: [facility] INVASIVE CV LAB;  Service: Cardiovascular;  Laterality: N/A;   TEE  WITHOUT CARDIOVERSION N/A 12/17/2013   Procedure: TRANSESOPHAGEAL ECHOCARDIOGRAM (TEE);  Surgeon: Vinie KYM Maxcy, MD;  Location: [facility] ENDOSCOPY;  Service: Cardiovascular;  Laterality: N/A;  trish/ja   TOTAL HIP ARTHROPLASTY Left 09/25/2021   Procedure: LEFT TOTAL HIP ARTHROPLASTY ANTERIOR APPROACH;  Surgeon: Liam Lerner, MD;  Location: [facility] ORS;  Service: Orthopedics;  Laterality: Left;   TRANSESOPHAGEAL ECHOCARDIOGRAM (CATH LAB) N/A 05/01/2024   Procedure: TRANSESOPHAGEAL ECHOCARDIOGRAM;  Surgeon: Francyne Headland, MD;  Location: [facility] INVASIVE CV LAB;  Service: Cardiovascular;  Laterality: N/A;   TRANSTHORACIC ECHOCARDIOGRAM  02/26/2008   LV systolic function normal with mild conc LVH; LA mildly dilated; trace MR/TR   UPPER GI ENDOSCOPY  2009   negative   Social History:  reports that she quit smoking about 30 years ago. Her smoking use included cigarettes. She started smoking about 60 years ago. She has a 45 pack-year smoking history. She has never used smokeless tobacco. She reports that she does not drink alcohol and does not use drugs.  Allergies  Allergen Reactions   Benadryl  [Diphenhydramine  Hcl] Other (See Comments)    Restless leg   Clopidogrel Bisulfate     Other reaction(s):  stomach upset   Ioversol Other (See Comments)    Unknown reaction   Benadryl  [Diphenhydramine ] Anxiety   Iodinated Contrast Media Palpitations and Rash    Rapid heart rate, hot Other reaction(s): rapid heartbeat  Other reaction(s): flushing    Family History  Problem Relation Age of Onset   Heart attack Father 79       2nd MI at 53   Colon cancer Brother 43   Gout Brother    Ulcers Mother    Emphysema Mother 110   Colon polyps Sister    Cancer Sister        Basal cell carcinoma   Pneumonia Maternal Grandmother    Hypertension Brother    Hyperlipidemia Brother    Cancer Brother        Skin   Diabetes Neg Hx    Stroke Neg Hx     Prior to Admission medications   Medication Sig Start Date End Date  Taking? Authorizing Provider  Apoaequorin (PREVAGEN EXTRA STRENGTH) 20 MG CAPS Take 20 mg by mouth daily.   Yes [provider]  ASCORBIC ACID  PO Take 1 tablet by mouth daily.   Yes [provider]  benzonatate  (TESSALON ) 200 MG capsule Take 1 capsule (200 mg total) by mouth 3 (three) times daily as needed. Patient taking differently: Take 200 mg by mouth 2 (two) times daily. 04/01/24  Yes Rollene Almarie LABOR, MD  Cholecalciferol (VITAMIN D3 PO) Take 1 capsule by mouth daily.   Yes [provider]  diltiazem  (CARDIZEM  CD) 180 MG 24 hr capsule Take 1 capsule (180 mg total) by mouth daily. 07/08/23  Yes Croitoru, Mihai, MD  ELIQUIS  5 MG TABS tablet TAKE ONE TABLET BY MOUTH TWICE DAILY 02/27/24  Yes Croitoru, Mihai, MD  famotidine  (PEPCID ) 20 MG tablet Take 1 tablet (20 mg total) by mouth 2 (two) times daily. Patient taking differently: Take 20 mg by mouth 2 (two) times daily as needed for heartburn or indigestion. 04/23/23  Yes Burns, Glade PARAS, MD  HYDROcodone -acetaminophen  (NORCO/VICODIN) 5-325 MG tablet Take 1 tablet by mouth daily as needed for moderate pain (pain score 4-6) (This is a 30 day supply). 05/06/24  Yes Rollene Almarie LABOR, MD  latanoprost  (XALATAN ) 0.005 % ophthalmic solution Place 1 drop into both eyes at bedtime. 02/07/21  Yes [provider]  lisinopril  (ZESTRIL ) 10 MG tablet Take 1 tablet (10 mg total) by mouth daily. Patient taking differently: Take 10 mg by mouth at bedtime. 10/31/23  Yes Croitoru, Mihai, MD  LORazepam  (ATIVAN ) 0.5 MG tablet Take 1 tablet (0.5 mg total) by mouth 2 (two) times daily. Patient taking differently: Take 0.5 mg by mouth at bedtime. 01/30/24  Yes Webb, Padonda B, FNP  metoprolol  succinate (TOPROL -XL) 100 MG 24 hr tablet Take 1 tablet (100 mg total) by mouth 2 (two) times daily. 05/17/23  Yes Rollene Almarie LABOR, MD  Multiple Minerals-Vitamins (CALCIUM  CITRATE PLUS/MAGNESIUM  PO) Take 1 tablet by mouth daily.   Yes  [provider]  nystatin  (MYCOSTATIN /NYSTOP ) powder Apply topically once a week. Patient taking differently: Apply 1 Application topically daily as needed (Rash). 05/17/23  Yes Rollene Almarie LABOR, MD  nystatin  ointment (MYCOSTATIN ) Apply 1 Application topically 2 (two) times daily as needed (IRRITATION). 05/17/23  Yes Rollene Almarie LABOR, MD  PARoxetine  (PAXIL ) 10 MG tablet Take 2.5 tablets (25 mg total) by mouth daily. Patient taking differently: Take 25 mg by mouth at bedtime. 05/17/23  Yes Rollene Almarie LABOR, MD  potassium chloride  SA (KLOR-CON   M) 20 MEQ tablet Take 1 tablet (20 mEq total) by mouth 2 (two) times daily. 12/12/23  Yes Croitoru, Mihai, MD  rOPINIRole  (REQUIP ) 0.5 MG tablet Take 1 tablet (0.5 mg total) by mouth at bedtime. Patient taking differently: Take 0.5 mg by mouth at bedtime as needed (restless leg). 01/21/23  Yes Dohmeier, Dedra, MD  rosuvastatin  (CRESTOR ) 10 MG tablet Take 1 tablet (10 mg total) by mouth at bedtime. 05/04/24  Yes Croitoru, Mihai, MD  senna (SENOKOT) 8.6 MG TABS Take 1 tablet by mouth at bedtime as needed for mild constipation.   Yes [provider]  sitaGLIPtin -metformin  (JANUMET ) 50-1000 MG tablet Take 1 tablet by mouth 2 (two) times daily with a meal. 05/17/23  Yes Rollene Almarie LABOR, MD  torsemide  (DEMADEX ) 20 MG tablet Take 2 tablets (40 mg total) by mouth daily. Patient taking differently: Take 40 mg by mouth 2 (two) times daily. 12/12/23  Yes Croitoru, Mihai, MD  traZODone  (DESYREL ) 100 MG tablet take UP TO 200 MG BY MOUTH AT bedtime FOR SLEEP Patient taking differently: Take 150 mg by mouth at bedtime. 02/11/24  Yes Gregg Lek, MD  triamcinolone  cream (KENALOG ) 0.1 % Apply 1 Application topically 2 (two) times daily. Patient taking differently: Apply 1 Application topically 2 (two) times daily as needed. 05/17/23  Yes Rollene Almarie LABOR, MD  Accu-Chek Softclix Lancets lancets  05/17/23   Rollene Almarie LABOR, MD  albuterol   (VENTOLIN  HFA) 108 [phone] Base) MCG/ACT inhaler Inhale 2 puffs into the lungs every 6 (six) hours as needed for wheezing or shortness of breath. Patient not taking: Reported on 05/12/2024 03/06/24   Neysa Rama D, MD  glucose blood (FREESTYLE LITE) test strip Use as instructed 05/17/23   Rollene Almarie LABOR, MD  metoprolol  tartrate (LOPRESSOR ) 25 MG tablet TAKE 1 TABLET ONCE DAILY AS NEEDED FOR RAPID PALPITATIONS. Patient not taking: Reported on 05/12/2024 04/02/23   Croitoru, Mihai, MD  umeclidinium-vilanterol (ANORO ELLIPTA ) 62.5-25 MCG/ACT AEPB Inhale 1 puff into the lungs daily. Patient taking differently: Inhale 1 puff into the lungs daily as needed (Breathing). 04/27/23   Neysa Rama BIRCH, MD    Physical Exam: Vitals:   05/12/24 0400 05/12/24 0500 05/12/24 0600 05/12/24 0805  BP: (!) 167/110 (!) 172/57 (!) 165/57   Pulse: 75 73 78   Resp: 16 14 14    Temp:    98.4 F (36.9 C)  TempSrc:    Oral  SpO2: 100% 94% 93%   Weight:      Height:       GENERAL: No apparent distress.  Nontoxic. HEENT: MMM.  Vision and hearing grossly intact.  NECK: Supple.  No apparent JVD.  RESP:  No IWOB.  Fair aeration bilaterally. CVS: Irregular rhythm.  Normal rate.  Heart sounds normal.  ABD/GI/GU: BS+. Abd soft, NTND.  MSK/EXT:   No apparent deformity. Moves extremities. No edema.  Compression socks in place.  Ace wrap over left lower leg and foot. SKIN: no apparent skin lesion or wound NEURO: Awake and alert. Oriented appropriately.  No apparent focal neuro deficit. PSYCH: Calm. Normal affect.   Data Reviewed: See HPI  Assessment and Plan: Persistent atrial fibrillation with symptomatic bradycardia: In the setting of hyperkalemia, hypomagnesemia and nodal blocking agents.  She is on Eliquis  for anticoagulation. -Continue cardiac telemetry monitoring - Correct electrolytes - Continue holding Cardizem  and metoprolol  - Continue Eliquis  for anticoagulation - Appreciate help by cardiology  AKI: Cr  1.85 (b/l ranges from 1.0-1.2).  In the setting of diuretics  and ACE inhibitors.  Renal ultrasound without acute finding.  Received 1 L NS bolus in ED.  AKI resolving Recent Labs    05/17/23 1522 09/16/23 1556 12/11/23 1524 12/15/23 1014 12/18/23 1427 04/22/24 1549 05/11/24 2352 05/12/24 0341 05/12/24 0903  BUN 7 15 15 11 13 20  35* 33* 29*  CREATININE 0.73 0.91 0.98 1.03* 1.06* 1.26* 1.85* 1.61* 1.26*  - Continue holding diuretics and ACE inhibitors - Recheck in the morning  Hyperkalemia: K6.9.  EKG with bradycardia.  Resolved. -S/p IV calcium  gluconate, dextrose , insulin  and Lokelma . - Hold home potassium supplementation  Hypomagnesemia - Monitor replenish as appropriate-Will give additional 2 g  Chronic hyponatremia: b/l Na ranges from 120-122.  Stable. - Check TSH.  I do not think her urine sodium level will be reliable due to diuretics and lisinopril . - Continue monitoring  Hypotension/history of essential hypertension: Resolved. - P.o. hydralazine as needed - Defer to cardiology for home medications.  UTI: Reports dysuria that she describes as difficulty urinating with urge to urinate.  Denies burning with urination.  Reports improvement after IV antibiotics.  UA consistent with UTI. - Continue IV ceftriaxone  pending cultures  Chronic HFpEF: Stable.  TEE on 9/19 with LVEF of 55 to 60%, indeterminate DD, severe LAE and moderately reduced RV SF.  Seems to take torsemide  40 mg twice daily at home.  Appears euvolemic on exam.  BNP 169.  CXR without acute finding - Hold home diuretics - Strict intake and output, daily weight, renal functions and electrolytes  History of CAD s/p remote stent in 2009. - Continue home Eliquis  - Beta-blocker and ACE inhibitors on hold for the above reason  Chronic COPD: Stable - Continue home inhalers  NIDDM-2 with hyperglycemia: A1c 7.0% 1.  W. Recent Labs  Lab 05/12/24 0209 05/12/24 0802  GLUCAP 242* 160*  - SSI-sensitive  Chronic  back pain: Stable - Continue home meds - PT/OT eval  Anxiety and depression: Stable - Continue home meds  RLS - Resume home Requip   Class I obesity: BMI 30.73    Advance Care Planning:   Code Status: Limited: Do not attempt resuscitation (DNR) -DNR-LIMITED -Do Not Intubate/DNI -discussed with patient.  Consults: Cardiology  Family Communication: None at bedside  Severity of Illness: The appropriate patient status for this patient is INPATIENT. Inpatient status is judged to be reasonable and necessary in order to provide the required intensity of service to ensure the patient's safety. The patient's presenting symptoms, physical exam findings, and initial radiographic and laboratory data in the context of their chronic comorbidities is felt to place them at high risk for further clinical deterioration. Furthermore, it is not anticipated that the patient will be medically stable for discharge from the hospital within 2 midnights of admission.   * I certify that at the point of admission it is my clinical judgment that the patient will require inpatient hospital care spanning beyond 2 midnights from the point of admission due to high intensity of service, high risk for further deterioration and high frequency of surveillance required.*  Author: Mignon ONEIDA Bump, MD 05/12/2024 10:17 AM  For on call review [email or web address].

## 2024-05-12 NOTE — ED Notes (Signed)
 Pt assisted X1 with walker to RR pt wife now at bedside

## 2024-05-12 NOTE — ED Provider Notes (Signed)
  Physical Exam  BP (!) 165/57   Pulse 78   Temp 98.8 F (37.1 C) (Oral)   Resp 14   Ht 1.575 m (5' 2)   Wt 76.2 kg   SpO2 93%   BMI 30.73 kg/m   Physical Exam  Procedures  Procedures  ED Course / MDM   Clinical Course as of 05/12/24 0710  Tue May 12, 2024  0111 Urinary sxs x 3 months worsening this week Palpitations new today [CC]    Clinical Course User Index [CC] Jerral Meth, MD   Medical Decision Making Amount and/or Complexity of Data Reviewed Labs: ordered. Radiology: ordered.  Risk OTC drugs. Prescription drug management. Decision regarding hospitalization.   73 yo female respiratory failure, esrd volume overload, inreased trop 140-280, EKG ok On bipap, critical care to and hospitalist to see Nephrology consulted        Levander Houston, MD 05/13/24 1129

## 2024-05-12 NOTE — Progress Notes (Signed)
 Rounding Note   Patient Name: [name] Date of Encounter: 05/12/2024  [facility] Cardiologist: Jerel Balding, MD   Subjective - Patient was admitted overnight for symptomatic bradycardia.  It is my first time seeing the patient today. - The patient reported having symptoms of SOB and lightheadedness that prompted her to present to the ED.  She is found to be bradycardic to the 40s. - Currently the patient denies having any symptoms and feels back to her baseline.  She denies chest pain, SOB, PND, orthopnea, swelling, palpitations. - Her AV nodal blocking agents are currently being held.  Scheduled Meds:  insulin  aspart  0-5 Units Subcutaneous QHS   insulin  aspart  0-9 Units Subcutaneous TID WC   rOPINIRole   0.5 mg Oral QHS   Continuous Infusions:  PRN Meds:    Vital Signs  Vitals:   05/12/24 0400 05/12/24 0500 05/12/24 0600 05/12/24 0805  BP: (!) 167/110 (!) 172/57 (!) 165/57   Pulse: 75 73 78   Resp: 16 14 14    Temp:    98.4 F (36.9 C)  TempSrc:    Oral  SpO2: 100% 94% 93%   Weight:      Height:        Intake/Output Summary (Last 24 hours) at 05/12/2024 0903 Last data filed at 05/12/2024 0606 Gross per 24 hour  Intake 1000 ml  Output --  Net 1000 ml      05/11/2024   11:45 PM 05/11/2024    1:38 PM 05/01/2024    8:58 AM  Last 3 Weights  Weight (lbs) 168 lb 168 lb 167 lb  Weight (kg) 76.204 kg 76.204 kg 75.751 kg      Telemetry Rate controlled atrial fibrillation- Personally Reviewed  ECG  Rate controlled atrial fibrillation- Personally Reviewed  Physical Exam  GEN: Laying in bed in no acute distress.   Neck: No JVD Cardiac: Regular rate with irregularly irregular rhythm, no murmurs, rubs, or gallops.  Respiratory: Clear to auscultation bilaterally. GI: Soft, nontender, non-distended  MS: No edema; brace on left foot. Neuro:  Nonfocal  Psych: Normal affect   Labs High Sensitivity Troponin:   Recent Labs  Lab 05/11/24 2352  05/12/24 0216  TROPONINIHS 5 6     Chemistry Recent Labs  Lab 05/11/24 2352 05/12/24 0341  NA 120* 121*  K 6.9* 5.7*  CL 91* 91*  CO2 20* 21*  GLUCOSE 152* 189*  BUN 35* 33*  CREATININE 1.85* 1.61*  CALCIUM  8.6* 8.9  MG 1.5*  --   PROT 6.6  --   ALBUMIN  3.5  --   AST 18  --   ALT 15  --   ALKPHOS 48  --   BILITOT 0.4  --   GFRNONAA 28* 33*  ANIONGAP 9 9    Lipids No results for input(s): CHOL, TRIG, HDL, LABVLDL, LDLCALC, CHOLHDL in the last 168 hours.  Hematology Recent Labs  Lab 05/11/24 2352  WBC 9.4  RBC 3.26*  HGB 9.1*  HCT 27.2*  MCV 83.4  MCH 27.9  MCHC 33.5  RDW 12.3  PLT 240   Thyroid  No results for input(s): TSH, FREET4 in the last 168 hours.  BNP Recent Labs  Lab 05/11/24 2352  BNP 167.3*    DDimer No results for input(s): DDIMER in the last 168 hours.   Radiology  US  Renal Result Date: 05/12/2024 EXAM: US  Retroperitoneum Complete, Renal. CLINICAL HISTORY: AKI. AKI. TECHNIQUE: Real-time ultrasound of the retroperitoneum (complete) with image documentation. COMPARISON:  None provided. FINDINGS: RIGHT KIDNEY: The right kidney measures 11.8 x 4.9 x 3.9 cm with a volume of 119 ml. Normal echogenicity. No hydronephrosis, renal stone, or mass visualized. LEFT KIDNEY: The left kidney measures 11.4 x 5.6 x 4.5 cm with a volume of 151 ml. Normal echogenicity. 1.3 cm Cyst in the mid left kidney does not require follow-up. No hydronephrosis or renal stone visualized. BLADDER: Unremarkable as visualized. IMPRESSION: 1. No hydronephrosis. 2. No acute abnormalities. Electronically signed by: Norman Gatlin MD 05/12/2024 02:44 AM EDT RP Workstation: [id number]   DG Chest Portable 1 View Result Date: 05/12/2024 EXAM: 1 VIEW XRAY OF THE CHEST 05/12/2024 12:12:14 AM COMPARISON: Comparison with 12/15/2023 CLINICAL HISTORY: palpitations. FINDINGS: LUNGS AND PLEURA: Left basilar scarring or atelectasis. No pulmonary edema. No pleural effusion. No  pneumothorax. HEART AND MEDIASTINUM: Stable cardiomegaly. Aortic atherosclerotic calcification. BONES AND SOFT TISSUES: No acute osseous abnormality. IMPRESSION: 1. No acute cardiopulmonary process. Electronically signed by: Norman Gatlin MD 05/12/2024 12:17 AM EDT RP Workstation: [id number]    Cardiac Studies  TEE 05/01/24:  IMPRESSIONS     1. Left ventricular ejection fraction, by estimation, is 55 to 60%. The  left ventricle has normal function. The left ventricle has no regional  wall motion abnormalities. Left ventricular diastolic function could not  be evaluated.   2. Right ventricular systolic function is moderately reduced. The right  ventricular size is moderately enlarged. Tricuspid regurgitation signal is  inadequate for assessing PA pressure.   3. Left atrial appendage measurements      00 degrees----diameter 18 mm----depth 17 mm      45 degrees----diameter 18 mm----depth 14 mm      90 degrees----diameter 23 mm----depth 19 mm      135 degrees---diameter 23 mm----depth 20 mm      . Left atrial size was severely dilated. No left atrial/left atrial  appendage thrombus was detected.   4. Right atrial size was severely dilated.   5. The mitral valve is normal in structure. Mild mitral valve  regurgitation.   6. The aortic valve is tricuspid. Aortic valve regurgitation is mild. No  aortic stenosis is present.   7. There is Moderate (Grade III) protruding plaque involving the  descending aorta.   8. The thin secundum part of the atrial septum is extensive, allowing  broad options for transseptal puncture.   9. 3D performed of the LAA and demonstrates anatomy amenable to Watchman  device deployment (probably 27 mm device).   TTE 05/20/22:  IMPRESSIONS     1. Left ventricular ejection fraction, by estimation, is 60 to 65%. The  left ventricle has normal function. The left ventricle has no regional  wall motion abnormalities. Left ventricular diastolic function could not   be evaluated.   2. Right ventricular systolic function is mildly reduced. The right  ventricular size is normal. Tricuspid regurgitation signal is inadequate  for assessing PA pressure.   3. Left atrial size was mildly dilated.   4. The mitral valve is normal in structure. Trivial mitral valve  regurgitation. No evidence of mitral stenosis.   5. The aortic valve is normal in structure. Aortic valve regurgitation is  mild. No aortic stenosis is present.   6. The inferior vena cava is dilated in size with >50% respiratory  variability, suggesting right atrial pressure of 8 mmHg.   Patient Profile   Ms. Clare is a 73 y.o. female with a PMH of AF (on Eliquis ), CAD, HFpEF, DM2, HTN and recent left foot fracture  who is being evaluated by cardiology for the evaluation of bradycardia.  Assessment & Plan   #Symptomatic Bradycardia #Atrial Fibrillation :: Patient presented with symptomatic bradycardia in the setting of multiple electrolyte and renal derangements and AV nodal blocking agent use.  I suspect that the effect of her AV nodal blocking agents was potentiated in the setting of her lab derangements.  I agree with holding both her metoprolol  and diltiazem  at this time.  Once her electrolyte and renal derangements are normalized then we can start adding back her medications as tolerated. - Continue holding metoprolol  and diltiazem  - Maintain telemetry - Continue home Eliquis   #HFpEF :: Appears euvolemic on physical exam.  On Lasix  at home.  Will hold given AKI. - Hold home Lasix  in light of AKI  #HTN - Hold home lisinopril  10 mg daily given AKI  #HLD - Continue Crestor   #AKI #Hyperkalemia - Patient being admitted to general medicine.  Management of AKI and hyperkalemia per primary team.     For questions or updates, please contact [facility] Please consult [email or web address] for contact info under       Signed, Georganna Archer, MD  05/12/2024, 9:03 AM

## 2024-05-13 DIAGNOSIS — I5030 Unspecified diastolic (congestive) heart failure: Secondary | ICD-10-CM

## 2024-05-13 DIAGNOSIS — R001 Bradycardia, unspecified: Secondary | ICD-10-CM | POA: Diagnosis not present

## 2024-05-13 DIAGNOSIS — I4891 Unspecified atrial fibrillation: Secondary | ICD-10-CM

## 2024-05-13 DIAGNOSIS — I10 Essential (primary) hypertension: Secondary | ICD-10-CM

## 2024-05-13 LAB — CBC
HCT: 23.9 % — ABNORMAL LOW (ref 36.0–46.0)
Hemoglobin: 8.4 g/dL — ABNORMAL LOW (ref 12.0–15.0)
MCH: 28.7 pg (ref 26.0–34.0)
MCHC: 35.1 g/dL (ref 30.0–36.0)
MCV: 81.6 fL (ref 80.0–100.0)
Platelets: 181 K/uL (ref 150–400)
RBC: 2.93 MIL/uL — ABNORMAL LOW (ref 3.87–5.11)
RDW: 12.6 % (ref 11.5–15.5)
WBC: 5.6 K/uL (ref 4.0–10.5)
nRBC: 0 % (ref 0.0–0.2)

## 2024-05-13 LAB — BASIC METABOLIC PANEL WITH GFR
Anion gap: 8 (ref 5–15)
BUN: 19 mg/dL (ref 8–23)
CO2: 22 mmol/L (ref 22–32)
Calcium: 9 mg/dL (ref 8.9–10.3)
Chloride: 97 mmol/L — ABNORMAL LOW (ref 98–111)
Creatinine, Ser: 0.99 mg/dL (ref 0.44–1.00)
GFR, Estimated: 59 mL/min — ABNORMAL LOW (ref >60)
Glucose, Bld: 127 mg/dL — ABNORMAL HIGH (ref 70–99)
Potassium: 4.7 mmol/L (ref 3.5–5.1)
Sodium: 127 mmol/L — ABNORMAL LOW (ref 135–145)

## 2024-05-13 LAB — GLUCOSE, CAPILLARY
Glucose-Capillary: 127 mg/dL — ABNORMAL HIGH (ref 70–99)
Glucose-Capillary: 149 mg/dL — ABNORMAL HIGH (ref 70–99)
Glucose-Capillary: 144 mg/dL — ABNORMAL HIGH (ref 70–99)
Glucose-Capillary: 220 mg/dL — ABNORMAL HIGH (ref 70–99)

## 2024-05-13 MED ORDER — LISINOPRIL 10 MG PO TABS
10.0000 mg | ORAL_TABLET | Freq: Every day | ORAL | Status: DC
  Administered 2024-05-13 – 2024-05-14 (×2): 10 mg via ORAL
  Filled 2024-05-13 (×2): qty 1

## 2024-05-13 MED ORDER — PNEUMOCOCCAL 20-VAL CONJ VACC 0.5 ML IM SUSY
0.5000 mL | PREFILLED_SYRINGE | INTRAMUSCULAR | Status: DC
  Filled 2024-05-13: qty 0.5

## 2024-05-13 MED ORDER — TORSEMIDE 20 MG PO TABS
40.0000 mg | ORAL_TABLET | Freq: Every day | ORAL | Status: DC
  Administered 2024-05-13 – 2024-05-14 (×2): 40 mg via ORAL
  Filled 2024-05-13 (×2): qty 2

## 2024-05-13 MED ORDER — LISINOPRIL 10 MG PO TABS: 10.0000 mg | ORAL_TABLET | Freq: Every day | ORAL | Status: DC

## 2024-05-13 MED ORDER — METOPROLOL TARTRATE 50 MG PO TABS
50.0000 mg | ORAL_TABLET | Freq: Two times a day (BID) | ORAL | Status: DC
  Administered 2024-05-13 (×2): 50 mg via ORAL
  Filled 2024-05-13 (×2): qty 1

## 2024-05-13 NOTE — Progress Notes (Signed)
 PT Cancellation Note  Patient Details Name: [name] MRN: [id number] DOB: 10-25-1950   Cancelled Treatment:    Reason Eval/Treat Not Completed: PT screened, no needs identified, will sign off. I met with patient and introduced role of acute PT evaluation and pt politely declined stating she felt she was at baseline mobility and that she did not want to be charged for PT evaluation. Pt reports having all DME and would like to resume OPPT after d/c. Please feel free to re-consult if change in mobility.   Izetta Call, PT, DPT   Acute Rehabilitation Department Office [phone] Secure Chat Communication Preferred   Izetta JULIANNA Call 05/13/2024, 11:50 AM

## 2024-05-13 NOTE — Progress Notes (Addendum)
  Progress Note  Patient Name: [name] Date of Encounter: 05/13/2024 [facility] Cardiologist: Jerel Balding, MD   Interval Summary   Resting comfortably in bed Feels good this morning  Renal function normalized this morning Remains in rate controlled A-fib No symptoms   Vital Signs Vitals:   05/13/24 0027 05/13/24 0353 05/13/24 0831 05/13/24 0921  BP: (!) 133/52 (!) 142/66 (!) 156/67   Pulse:   90 67  Resp: 18 18 20    Temp:  98.6 F (37 C) 98.7 F (37.1 C)   TempSrc:  Oral Oral   SpO2:   91% 98%  Weight:  78.2 kg    Height:        Intake/Output Summary (Last 24 hours) at 05/13/2024 9077 Last data filed at 05/12/2024 2115 Gross per 24 hour  Intake 440 ml  Output --  Net 440 ml      05/13/2024    3:53 AM 05/11/2024   11:45 PM 05/11/2024    1:38 PM  Last 3 Weights  Weight (lbs) 172 lb 6.4 oz 168 lb 168 lb  Weight (kg) 78.2 kg 76.204 kg 76.204 kg     Telemetry/ECG  A-Fib, rate controlled, HR 80s - Personally Reviewed  Physical Exam  GEN: No acute distress.   Neck: No JVD Cardiac: iRRR, no murmurs, rubs, or gallops.  Respiratory: Clear to auscultation bilaterally. GI: Soft, nontender, non-distended  MS: No edema  Assessment & Plan   Symptomatic bradycardia Permanent A-Fib, scheduled for LAAO 06/11/24 Presented to ED with symptomatic bradycardia in setting of electrolyte/renal derangement with nodal blocking use  Held metoprolol  and diltiazem   Stabilized renal function and electrolytes ? HR improved Remains in A-Fib with HR 80s  Continue Eliquis  5 mg BID Would like to trial adding back beta-blocker to see how she tolerates prior to discharge; starting Lopressor  50 mg BID  Chronic HFpEF Appears euvolemic  Home Lasix  was held in setting of AKI Renal function recovered today  Hypertension  BP remains elevated this admission  Home lisinopril  was held in setting of AKI Adding back home lisinopril  today  Hyperlipidemia 12/11/2023: HDL 37;  LDL Chol Calc (NIH) 34 05/11/2024: ALT 15  Continue Crestor  10 mg daily    For questions or updates, please contact [facility] Please consult [email or web address] for contact info under         Signed, Waddell DELENA Donath, PA-C

## 2024-05-13 NOTE — Progress Notes (Signed)
 Progress Note   Patient: [name] FMW:[id number] DOB: Jul 10, 1951 DOA: 05/11/2024     1 DOS: the patient was seen and examined on 05/13/2024   Brief hospital course: 73 y.o. female with PMH of CAD/PCI, HFpEF, persistent A-fib on Eliquis , COPD, DM-2, chronic hyponatremia, anemia, DDD/lumbar stenosis/lower back pain, anxiety and depression presenting with lightheadedness and shortness of breath since early last night.   Patient reports lightheadedness with shortness of breath that has started about 7 PM last night.  No chest pain.  She reports intermittent dry cough.  Also reports difficulty urinating.  She denies pain with urination or frequency but urgency to urinate.  This been going on for about 2 weeks but resolved this morning.  She denies fever, nausea, vomiting, abdominal pain or new back pain.  She denies palpitation, focal weakness, numbness or tingling.  She denies diarrhea, constipation, melena or hematochezia.  Assessment and Plan: Persistent atrial fibrillation with symptomatic bradycardia: In the setting of hyperkalemia, hypomagnesemia and nodal blocking agents.  She is on Eliquis  for anticoagulation. -initially held Cardizem  and metoprolol  - Continue Eliquis  for anticoagulation - Cardiology consulted. With bradycardia resolving, plan to restart metoprolol  50mg  bid   AKI: Cr 1.85 (b/l ranges from 1.0-1.2).  In the setting of diuretics and ACE inhibitors.  Renal ultrasound without acute finding.  Received 1 L NS bolus in ED.  A -Renal function normalized -lisinopril  was restarted   Hyperkalemia: K6.9.  EKG with bradycardia.  Resolved. -S/p IV calcium  gluconate, dextrose , insulin  and Lokelma . - Hold home potassium supplementation   Hypomagnesemia - Monitor replenish as appropriate-Will give additional 2 g   Chronic hyponatremia: b/l Na ranges from 120-122.  Stable. -improved - Continue monitoring   Hypotension/history of essential hypertension: Resolved. - P.o.  hydralazine as needed - resumed metoprolol  and lisinopril    UTI: Reports dysuria that she describes as difficulty urinating with urge to urinate.  Denies burning with urination.  Reports improvement after IV antibiotics.  UA consistent with UTI. - Continue IV ceftriaxone     Chronic HFpEF: Stable.  TEE on 9/19 with LVEF of 55 to 60%, indeterminate DD, severe LAE and moderately reduced RV SF.  Seems to take torsemide  40 mg twice daily at home.  Appears euvolemic on exam.  BNP 169.  CXR without acute finding - Hold home diuretics - Strict intake and output, daily weight, renal functions and electrolytes   History of CAD s/p remote stent in 2009. - Continue home Eliquis  - Now resumed beta blocker and ACEI   Chronic COPD: Stable - Continue home inhalers   NIDDM-2 with hyperglycemia: A1c 7.0% 1.   -continued on SSI as needed   Chronic back pain: Stable - Continue home meds - PT/OT eval   Anxiety and depression: Stable - Continue home meds   RLS - Resume home Requip    Class I obesity: BMI 30.73       Subjective: Feels better. Eager to go home soon  Physical Exam: Vitals:   05/13/24 0831 05/13/24 0918 05/13/24 0921 05/13/24 1154  BP: (!) 156/67   138/66  Pulse: 90 81 67 76  Resp: 20   17  Temp: 98.7 F (37.1 C)   (!) 96.7 F (35.9 C)  TempSrc: Oral   Axillary  SpO2: 91% 96% 98% 96%  Weight:      Height:       General exam: Awake, laying in bed, in nad Respiratory system: Normal respiratory effort, no wheezing Cardiovascular system: regular rate, s1, s2 Gastrointestinal system:  Soft, nondistended, positive BS Central nervous system: CN2-12 grossly intact, strength intact Extremities: Perfused, no clubbing Skin: Normal skin turgor, no notable skin lesions seen Psychiatry: Mood normal // no visual hallucinations   Data Reviewed:  Labs reviewed: Na 127, K 4.7, Cr 0.99, WBC 5.6, Hgb 8.4, Plts 181   Family Communication: Pt in room, family not at  bedside  Disposition: Status is: Inpatient Remains inpatient appropriate because: severity of illness  Planned Discharge Destination: Home     Author: Garnette Pelt, MD 05/13/2024 3:33 PM  For on call review [email or web address].

## 2024-05-13 NOTE — Hospital Course (Signed)
 73 y.o. female with PMH of CAD/PCI, HFpEF, persistent A-fib on Eliquis , COPD, DM-2, chronic hyponatremia, anemia, DDD/lumbar stenosis/lower back pain, anxiety and depression presenting with lightheadedness and shortness of breath since early last night.   Patient reports lightheadedness with shortness of breath that has started about 7 PM last night.  No chest pain.  She reports intermittent dry cough.  Also reports difficulty urinating.  She denies pain with urination or frequency but urgency to urinate.  This been going on for about 2 weeks but resolved this morning.  She denies fever, nausea, vomiting, abdominal pain or new back pain.  She denies palpitation, focal weakness, numbness or tingling.  She denies diarrhea, constipation, melena or hematochezia.

## 2024-05-13 NOTE — Progress Notes (Signed)
 Mobility Specialist Progress Note;   05/13/24 1500  Mobility  Activity Ambulated with assistance  Level of Assistance Standby assist, set-up cues, supervision of patient - no hands on  Assistive Device Four wheel walker  Distance Ambulated (ft) 400 ft  Activity Response Tolerated well  Mobility Referral Yes  Mobility visit 1 Mobility  Mobility Specialist Start Time (ACUTE ONLY) 1500  Mobility Specialist Stop Time (ACUTE ONLY) 1530  Mobility Specialist Time Calculation (min) (ACUTE ONLY) 30 min   Pt agreeable to mobility. Required no physical assistance during ambulation, SV for safety. Took 1x standing rest break d/t overall fatigue. VSS throughout and no other c/o when asked. Pt returned back to bed and left with all needs met. Spouse present.   Lauraine Erm Mobility Specialist Please contact via SecureChat or [employer] [phone]

## 2024-05-14 ENCOUNTER — Other Ambulatory Visit (HOSPITAL_COMMUNITY): Payer: Self-pay

## 2024-05-14 DIAGNOSIS — R001 Bradycardia, unspecified: Secondary | ICD-10-CM | POA: Diagnosis not present

## 2024-05-14 LAB — URINE CULTURE: Culture: >=100000 — AB

## 2024-05-14 LAB — COMPREHENSIVE METABOLIC PANEL WITH GFR
ALT: 15 U/L (ref 0–44)
AST: 20 U/L (ref 15–41)
Albumin: 3.6 g/dL (ref 3.5–5.0)
Alkaline Phosphatase: 55 U/L (ref 38–126)
Anion gap: 10 (ref 5–15)
BUN: 20 mg/dL (ref 8–23)
CO2: 25 mmol/L (ref 22–32)
Calcium: 8.9 mg/dL (ref 8.9–10.3)
Chloride: 93 mmol/L — ABNORMAL LOW (ref 98–111)
Creatinine, Ser: 1.01 mg/dL — ABNORMAL HIGH (ref 0.44–1.00)
GFR, Estimated: 58 mL/min — ABNORMAL LOW (ref >60)
Glucose, Bld: 113 mg/dL — ABNORMAL HIGH (ref 70–99)
Potassium: 4.1 mmol/L (ref 3.5–5.1)
Sodium: 128 mmol/L — ABNORMAL LOW (ref 135–145)
Total Bilirubin: 0.6 mg/dL (ref 0.0–1.2)
Total Protein: 7.1 g/dL (ref 6.5–8.1)

## 2024-05-14 LAB — CBC
HCT: 26.2 % — ABNORMAL LOW (ref 36.0–46.0)
Hemoglobin: 9 g/dL — ABNORMAL LOW (ref 12.0–15.0)
MCH: 27.9 pg (ref 26.0–34.0)
MCHC: 34.4 g/dL (ref 30.0–36.0)
MCV: 81.1 fL (ref 80.0–100.0)
Platelets: 212 K/uL (ref 150–400)
RBC: 3.23 MIL/uL — ABNORMAL LOW (ref 3.87–5.11)
RDW: 12.4 % (ref 11.5–15.5)
WBC: 6.5 K/uL (ref 4.0–10.5)
nRBC: 0 % (ref 0.0–0.2)

## 2024-05-14 LAB — GLUCOSE, CAPILLARY
Glucose-Capillary: 173 mg/dL — ABNORMAL HIGH (ref 70–99)
Glucose-Capillary: 122 mg/dL — ABNORMAL HIGH (ref 70–99)

## 2024-05-14 LAB — MAGNESIUM: Magnesium: 1.4 mg/dL — ABNORMAL LOW (ref 1.7–2.4)

## 2024-05-14 MED ORDER — METOPROLOL SUCCINATE ER 50 MG PO TB24
50.0000 mg | ORAL_TABLET | Freq: Two times a day (BID) | ORAL | Status: DC
  Administered 2024-05-14: 50 mg via ORAL
  Filled 2024-05-14: qty 1

## 2024-05-14 MED ORDER — DILTIAZEM HCL ER COATED BEADS 120 MG PO CP24
120.0000 mg | ORAL_CAPSULE | Freq: Every day | ORAL | Status: DC
  Administered 2024-05-14: 120 mg via ORAL
  Filled 2024-05-14: qty 1

## 2024-05-14 MED ORDER — MAGNESIUM SULFATE 2 GM/50ML IV SOLN
2.0000 g | Freq: Once | INTRAVENOUS | Status: AC
  Administered 2024-05-14: 2 g via INTRAVENOUS
  Filled 2024-05-14: qty 50

## 2024-05-14 MED ORDER — METOPROLOL SUCCINATE ER 50 MG PO TB24
50.0000 mg | ORAL_TABLET | Freq: Two times a day (BID) | ORAL | 0 refills | Status: DC
  Filled 2024-05-14: qty 60, 30d supply, fill #0

## 2024-05-14 MED ORDER — CEPHALEXIN 500 MG PO CAPS
500.0000 mg | ORAL_CAPSULE | Freq: Two times a day (BID) | ORAL | 0 refills | Status: AC
  Filled 2024-05-14: qty 4, 2d supply, fill #0

## 2024-05-14 MED ORDER — DILTIAZEM HCL ER COATED BEADS 120 MG PO CP24
120.0000 mg | ORAL_CAPSULE | Freq: Every day | ORAL | 0 refills | Status: DC
  Filled 2024-05-14: qty 30, 30d supply, fill #0

## 2024-05-14 MED ORDER — MAGNESIUM OXIDE -MG SUPPLEMENT 400 (240 MG) MG PO TABS
200.0000 mg | ORAL_TABLET | Freq: Every day | ORAL | Status: DC
  Administered 2024-05-14: 200 mg via ORAL
  Filled 2024-05-14: qty 1

## 2024-05-14 NOTE — Discharge Summary (Signed)
 Physician Discharge Summary   Patient: [name] MRN: [id number] DOB: February 06, 1951  Admit date:     05/11/2024  Discharge date: 05/14/24  Discharge Physician: Garnette Pelt   PCP: Rollene Almarie LABOR, MD   Recommendations at discharge:   Follow up with PCP in 1-2 weeks Follow up with Cardiology as scheduled Consider rechecking BMET and Mg in 1 week. Focus on electrolytes  Discharge Diagnoses: Principal Problem:   Bradycardia Active Problems:   Symptomatic bradycardia  Resolved Problems:   * No resolved hospital problems. *  Hospital Course: 73 y.o. female with PMH of CAD/PCI, HFpEF, persistent A-fib on Eliquis , COPD, DM-2, chronic hyponatremia, anemia, DDD/lumbar stenosis/lower back pain, anxiety and depression presenting with lightheadedness and shortness of breath since early last night.   Patient reports lightheadedness with shortness of breath that has started about 7 PM last night.  No chest pain.  She reports intermittent dry cough.  Also reports difficulty urinating.  She denies pain with urination or frequency but urgency to urinate.  This been going on for about 2 weeks but resolved this morning.  She denies fever, nausea, vomiting, abdominal pain or new back pain.  She denies palpitation, focal weakness, numbness or tingling.  She denies diarrhea, constipation, melena or hematochezia.  Assessment and Plan: Persistent atrial fibrillation with symptomatic bradycardia: In the setting of hyperkalemia, hypomagnesemia and nodal blocking agents.  She is on Eliquis  for anticoagulation. -initially held Cardizem  and metoprolol  - Continue Eliquis  for anticoagulation - Cardiology consulted. With bradycardia resolving, Restarted metoprolol  50mg  bid   AKI: Cr 1.85 (b/l ranges from 1.0-1.2).  In the setting of diuretics and ACE inhibitors.  Renal ultrasound without acute finding.  Received 1 L NS bolus in ED.  A -Renal function normalized -lisinopril  was restarted   Hyperkalemia:  K6.9.  EKG with bradycardia.  Resolved. -S/p IV calcium  gluconate, dextrose , insulin  and Lokelma . - Hold home potassium supplementation   Hypomagnesemia - replaced  Chronic hyponatremia: b/l Na ranges from 120-122.  Stable. -improved   Hypotension/history of essential hypertension: Resolved. - P.o. hydralazine as needed - resumed metoprolol  and lisinopril  per above   Klebsiella UTI: Reports dysuria that she describes as difficulty urinating with urge to urinate.  Denies burning with urination.  Reports improvement after IV antibiotics.  UA consistent with UTI. - was given IV ceftriaxone , complete course with keflex  on d/c   Chronic HFpEF: Stable.  TEE on 9/19 with LVEF of 55 to 60%, indeterminate DD, severe LAE and moderately reduced RV SF.  Seems to take torsemide  40 mg twice daily at home.  Appears euvolemic on exam.  BNP 169.  CXR without acute finding   History of CAD s/p remote stent in 2009. - Continue home Eliquis  - Now resumed beta blocker and ACEI   Chronic COPD: Stable - Continue home inhalers   NIDDM-2 with hyperglycemia: A1c 7.0% 1.   -continued on SSI as needed   Chronic back pain: Stable - Continue home meds - PT/OT eval   Anxiety and depression: Stable - Continue home meds   RLS - Resume home Requip    Class I obesity: BMI 30.73    Consultants: Cardiology Procedures performed:   Disposition: Home Diet recommendation:  Cardiac and Carb modified diet DISCHARGE MEDICATION: Allergies as of 05/14/2024       Reactions   Benadryl  [diphenhydramine  Hcl] Other (See Comments)   Restless leg   Clopidogrel Bisulfate    Other reaction(s): stomach upset   Ioversol Other (See Comments)   Unknown  reaction   Benadryl  [diphenhydramine ] Anxiety   Iodinated Contrast Media Palpitations, Rash   Rapid heart rate, hot Other reaction(s): rapid heartbeat Other reaction(s): flushing        Medication List     STOP taking these medications    metoprolol  tartrate  25 MG tablet Commonly known as: LOPRESSOR    potassium chloride  SA 20 MEQ tablet Commonly known as: KLOR-CON  M       TAKE these medications    Accu-Chek Softclix Lancets lancets   albuterol  108 (90 Base) MCG/ACT inhaler Commonly known as: VENTOLIN  HFA Inhale 2 puffs into the lungs every 6 (six) hours as needed for wheezing or shortness of breath.   Anoro Ellipta  62.5-25 MCG/ACT Aepb Generic drug: umeclidinium-vilanterol Inhale 1 puff into the lungs daily. What changed:  when to take this reasons to take this   ASCORBIC ACID  PO Take 1 tablet by mouth daily.   benzonatate  200 MG capsule Commonly known as: TESSALON  Take 1 capsule (200 mg total) by mouth 3 (three) times daily as needed. What changed: when to take this   CALCIUM  CITRATE PLUS/MAGNESIUM  PO Take 1 tablet by mouth daily.   cephALEXin  500 MG capsule Commonly known as: KEFLEX  Take 1 capsule (500 mg total) by mouth 2 (two) times daily for 2 days. Start taking on: May 15, 2024   diltiazem  120 MG 24 hr capsule Commonly known as: CARDIZEM  CD Take 1 capsule (120 mg total) by mouth daily. Start taking on: May 15, 2024 What changed:  medication strength how much to take   Eliquis  5 MG Tabs tablet Generic drug: apixaban  TAKE ONE TABLET BY MOUTH TWICE DAILY   famotidine  20 MG tablet Commonly known as: PEPCID  Take 1 tablet (20 mg total) by mouth 2 (two) times daily. What changed:  when to take this reasons to take this   FREESTYLE LITE test strip Generic drug: glucose blood Use as instructed   HYDROcodone -acetaminophen  5-325 MG tablet Commonly known as: NORCO/VICODIN Take 1 tablet by mouth daily as needed for moderate pain (pain score 4-6) (This is a 30 day supply).   Janumet  50-1000 MG tablet Generic drug: sitaGLIPtin -metformin  Take 1 tablet by mouth 2 (two) times daily with a meal.   latanoprost  0.005 % ophthalmic solution Commonly known as: XALATAN  Place 1 drop into both eyes at bedtime.    lisinopril  10 MG tablet Commonly known as: ZESTRIL  Take 1 tablet (10 mg total) by mouth daily. What changed: when to take this   LORazepam  0.5 MG tablet Commonly known as: ATIVAN  Take 1 tablet (0.5 mg total) by mouth 2 (two) times daily. What changed: when to take this   metoprolol  succinate 50 MG 24 hr tablet Commonly known as: TOPROL -XL Take 1 tablet (50 mg total) by mouth 2 (two) times daily. Take with or immediately following a meal. What changed:  medication strength how much to take additional instructions   nystatin  powder Commonly known as: MYCOSTATIN /NYSTOP  Apply topically once a week. What changed:  how much to take when to take this reasons to take this   nystatin  ointment Commonly known as: MYCOSTATIN  Apply 1 Application topically 2 (two) times daily as needed (IRRITATION). What changed: Another medication with the same name was changed. Make sure you understand how and when to take each.   PARoxetine  10 MG tablet Commonly known as: PAXIL  Take 2.5 tablets (25 mg total) by mouth daily. What changed: when to take this   Prevagen Extra Strength 20 MG Caps Generic drug: Apoaequorin Take 20  mg by mouth daily.   rOPINIRole  0.5 MG tablet Commonly known as: REQUIP  Take 1 tablet (0.5 mg total) by mouth at bedtime. What changed:  when to take this reasons to take this   rosuvastatin  10 MG tablet Commonly known as: CRESTOR  Take 1 tablet (10 mg total) by mouth at bedtime.   senna 8.6 MG Tabs tablet Commonly known as: SENOKOT Take 1 tablet by mouth at bedtime as needed for mild constipation.   torsemide  20 MG tablet Commonly known as: DEMADEX  Take 2 tablets (40 mg total) by mouth daily. What changed: when to take this   traZODone  100 MG tablet Commonly known as: DESYREL  take UP TO 200 MG BY MOUTH AT bedtime FOR SLEEP What changed:  how much to take how to take this when to take this additional instructions   triamcinolone  cream 0.1 % Commonly  known as: KENALOG  Apply 1 Application topically 2 (two) times daily. What changed:  when to take this reasons to take this   VITAMIN D3 PO Take 1 capsule by mouth daily.        Follow-up Information     Rollene Almarie LABOR, MD Follow up in 2 week(s).   Specialty: Internal Medicine Why: Hospital follow up Contact information: [address] [place] [address] [phone]         Follow up with your Cardiologist as scheduled Follow up.   Why: Hospital follow up               Discharge Exam: Filed Weights   05/11/24 2345 05/13/24 0353 05/14/24 0554  Weight: 76.2 kg 78.2 kg 77.5 kg   General exam: Awake, laying in bed, in nad Respiratory system: Normal respiratory effort, no wheezing Cardiovascular system: regular rate, s1, s2 Gastrointestinal system: Soft, nondistended, positive BS Central nervous system: CN2-12 grossly intact, strength intact Extremities: Perfused, no clubbing Skin: Normal skin turgor, no notable skin lesions seen Psychiatry: Mood normal // no visual hallucinations   Condition at discharge: fair  The results of significant diagnostics from this hospitalization (including imaging, microbiology, ancillary and laboratory) are listed below for reference.   Imaging Studies: US  Renal Result Date: 05/12/2024 EXAM: US  Retroperitoneum Complete, Renal. CLINICAL HISTORY: AKI. AKI. TECHNIQUE: Real-time ultrasound of the retroperitoneum (complete) with image documentation. COMPARISON: None provided. FINDINGS: RIGHT KIDNEY: The right kidney measures 11.8 x 4.9 x 3.9 cm with a volume of 119 ml. Normal echogenicity. No hydronephrosis, renal stone, or mass visualized. LEFT KIDNEY: The left kidney measures 11.4 x 5.6 x 4.5 cm with a volume of 151 ml. Normal echogenicity. 1.3 cm Cyst in the mid left kidney does not require follow-up. No hydronephrosis or renal stone visualized. BLADDER: Unremarkable as visualized. IMPRESSION: 1. No hydronephrosis. 2. No  acute abnormalities. Electronically signed by: Norman Gatlin MD 05/12/2024 02:44 AM EDT RP Workstation: [id number]   DG Chest Portable 1 View Result Date: 05/12/2024 EXAM: 1 VIEW XRAY OF THE CHEST 05/12/2024 12:12:14 AM COMPARISON: Comparison with 12/15/2023 CLINICAL HISTORY: palpitations. FINDINGS: LUNGS AND PLEURA: Left basilar scarring or atelectasis. No pulmonary edema. No pleural effusion. No pneumothorax. HEART AND MEDIASTINUM: Stable cardiomegaly. Aortic atherosclerotic calcification. BONES AND SOFT TISSUES: No acute osseous abnormality. IMPRESSION: 1. No acute cardiopulmonary process. Electronically signed by: Norman Gatlin MD 05/12/2024 12:17 AM EDT RP Workstation: [id number]   ECHO TEE Result Date: 05/01/2024    TRANSESOPHOGEAL ECHO REPORT   Patient Name:   [name] Date of Exam: 05/01/2024 Medical Rec #:  [id number]  Height:       62.0 in Accession #:    [id number]     Weight:       167.0 lb Date of Birth:  Oct 05, 1950      BSA:          1.771 m Patient Age:    73 years       BP:           156/60 mmHg Patient Gender: F              HR:           67 bpm. Exam Location:  Inpatient Procedure: Transesophageal Echo, Color Doppler, Cardiac Doppler and 3D Echo            (Both Spectral and Color Flow Doppler were utilized during            procedure). Indications:     Pre watchman eval  History:         Patient has prior history of Echocardiogram examinations, most                  recent 05/20/2022.  Sonographer:     Tinnie Gosling RDCS Referring Phys:  [id number] FONDA KITTY Diagnosing Phys: Jerel Balding MD PROCEDURE: After discussion of the risks and benefits of a TEE, an informed consent was obtained. The transesophogeal probe was passed without difficulty through the esophogus of the patient. Sedation performed by different physician. The patient developed no complications during the procedure.  IMPRESSIONS  1. Left ventricular ejection fraction, by estimation, is 55 to 60%. The left ventricle  has normal function. The left ventricle has no regional wall motion abnormalities. Left ventricular diastolic function could not be evaluated.  2. Right ventricular systolic function is moderately reduced. The right ventricular size is moderately enlarged. Tricuspid regurgitation signal is inadequate for assessing PA pressure.  3. Left atrial appendage measurements     00 degrees----diameter 18 mm----depth 17 mm     45 degrees----diameter 18 mm----depth 14 mm     90 degrees----diameter 23 mm----depth 19 mm     135 degrees---diameter 23 mm----depth 20 mm     . Left atrial size was severely dilated. No left atrial/left atrial appendage thrombus was detected.  4. Right atrial size was severely dilated.  5. The mitral valve is normal in structure. Mild mitral valve regurgitation.  6. The aortic valve is tricuspid. Aortic valve regurgitation is mild. No aortic stenosis is present.  7. There is Moderate (Grade III) protruding plaque involving the descending aorta.  8. The thin secundum part of the atrial septum is extensive, allowing broad options for transseptal puncture.  9. 3D performed of the LAA and demonstrates anatomy amenable to Watchman device deployment (probably 27 mm device). FINDINGS  Left Ventricle: Left ventricular ejection fraction, by estimation, is 55 to 60%. The left ventricle has normal function. The left ventricle has no regional wall motion abnormalities. The left ventricular internal cavity size was normal in size. Left ventricular diastolic function could not be evaluated due to atrial fibrillation. Left ventricular diastolic function could not be evaluated. Right Ventricle: The right ventricular size is moderately enlarged. No increase in right ventricular wall thickness. Right ventricular systolic function is moderately reduced. Tricuspid regurgitation signal is inadequate for assessing PA pressure. Left Atrium: Left atrial appendage measurements 00 degrees----diameter 18 mm----depth 17 mm 45  degrees----diameter 18 mm----depth 14 mm 90 degrees----diameter 23 mm----depth 19 mm 135 degrees---diameter 23 mm----depth 20 mm. Left atrial size was  severely dilated. No left atrial/left atrial appendage thrombus was detected. Right Atrium: Right atrial size was severely dilated. Pericardium: There is no evidence of pericardial effusion. Mitral Valve: The mitral valve is normal in structure. Mild mitral valve regurgitation, with centrally-directed jet. Tricuspid Valve: The tricuspid valve is normal in structure. Tricuspid valve regurgitation is mild. Aortic Valve: The aortic valve is tricuspid. Aortic valve regurgitation is mild. No aortic stenosis is present. Pulmonic Valve: The pulmonic valve was grossly normal. Pulmonic valve regurgitation is not visualized. No evidence of pulmonic stenosis. Aorta: The aortic root, ascending aorta, aortic arch and descending aorta are all structurally normal, with no evidence of dilitation or obstruction. There is moderate (Grade III) protruding plaque involving the descending aorta. IAS/Shunts: No atrial level shunt detected by color flow Doppler. The thin secundum part of the atrial septum is extensive, allowing broad options for transseptal puncture. Jerel Balding MD Electronically signed by Jerel Balding MD Signature Date/Time: 05/01/2024/6:23:31 PM    Final    EP STUDY Result Date: 05/01/2024 See surgical note for result.   Microbiology: Results for orders placed or performed during the hospital encounter of 05/11/24  Urine Culture     Status: Abnormal   Collection Time: 05/12/24  1:54 AM   Specimen: Urine, Clean Catch  Result Value Ref Range Status   Specimen Description URINE, CLEAN CATCH  Final   Special Requests   Final    NONE Performed at [facility], [address], [place], [place] [address]    Culture >=100,000 COLONIES/mL KLEBSIELLA PNEUMONIAE (A)  Final   Report Status 05/14/2024 FINAL  Final   Organism ID, Bacteria KLEBSIELLA  PNEUMONIAE (A)  Final      Susceptibility   Klebsiella pneumoniae - MIC*    AMPICILLIN RESISTANT Resistant     CEFAZOLIN  (URINE) Value in next row Sensitive      2 SENSITIVEThis is a modified FDA-approved test that has been validated and its performance characteristics determined by the reporting laboratory.  This laboratory is certified under the Clinical Laboratory Improvement Amendments CLIA as qualified to perform high complexity clinical laboratory testing.    CEFEPIME Value in next row Sensitive      2 SENSITIVEThis is a modified FDA-approved test that has been validated and its performance characteristics determined by the reporting laboratory.  This laboratory is certified under the Clinical Laboratory Improvement Amendments CLIA as qualified to perform high complexity clinical laboratory testing.    ERTAPENEM Value in next row Sensitive      2 SENSITIVEThis is a modified FDA-approved test that has been validated and its performance characteristics determined by the reporting laboratory.  This laboratory is certified under the Clinical Laboratory Improvement Amendments CLIA as qualified to perform high complexity clinical laboratory testing.    CEFTRIAXONE  Value in next row Sensitive      2 SENSITIVEThis is a modified FDA-approved test that has been validated and its performance characteristics determined by the reporting laboratory.  This laboratory is certified under the Clinical Laboratory Improvement Amendments CLIA as qualified to perform high complexity clinical laboratory testing.    CIPROFLOXACIN  Value in next row Sensitive      2 SENSITIVEThis is a modified FDA-approved test that has been validated and its performance characteristics determined by the reporting laboratory.  This laboratory is certified under the Clinical Laboratory Improvement Amendments CLIA as qualified to perform high complexity clinical laboratory testing.    GENTAMICIN Value in next row Sensitive      2  SENSITIVEThis  is a modified FDA-approved test that has been validated and its performance characteristics determined by the reporting laboratory.  This laboratory is certified under the Clinical Laboratory Improvement Amendments CLIA as qualified to perform high complexity clinical laboratory testing.    NITROFURANTOIN  Value in next row Intermediate      2 SENSITIVEThis is a modified FDA-approved test that has been validated and its performance characteristics determined by the reporting laboratory.  This laboratory is certified under the Clinical Laboratory Improvement Amendments CLIA as qualified to perform high complexity clinical laboratory testing.    TRIMETH /SULFA  Value in next row Sensitive      2 SENSITIVEThis is a modified FDA-approved test that has been validated and its performance characteristics determined by the reporting laboratory.  This laboratory is certified under the Clinical Laboratory Improvement Amendments CLIA as qualified to perform high complexity clinical laboratory testing.    AMPICILLIN/SULBACTAM Value in next row Sensitive      2 SENSITIVEThis is a modified FDA-approved test that has been validated and its performance characteristics determined by the reporting laboratory.  This laboratory is certified under the Clinical Laboratory Improvement Amendments CLIA as qualified to perform high complexity clinical laboratory testing.    PIP/TAZO Value in next row Sensitive      <=4 SENSITIVEThis is a modified FDA-approved test that has been validated and its performance characteristics determined by the reporting laboratory.  This laboratory is certified under the Clinical Laboratory Improvement Amendments CLIA as qualified to perform high complexity clinical laboratory testing.    MEROPENEM Value in next row Sensitive      <=4 SENSITIVEThis is a modified FDA-approved test that has been validated and its performance characteristics determined by the reporting laboratory.  This  laboratory is certified under the Clinical Laboratory Improvement Amendments CLIA as qualified to perform high complexity clinical laboratory testing.    * >=100,000 COLONIES/mL KLEBSIELLA PNEUMONIAE   *Note: Due to a large number of results and/or encounters for the requested time period, some results have not been displayed. A complete set of results can be found in Results Review.    Labs: CBC: Recent Labs  Lab 05/11/24 2352 05/13/24 0504 05/14/24 0444  WBC 9.4 5.6 6.5  NEUTROABS 8.0*  --   --   HGB 9.1* 8.4* 9.0*  HCT 27.2* 23.9* 26.2*  MCV 83.4 81.6 81.1  PLT 240 181 212   Basic Metabolic Panel: Recent Labs  Lab 05/11/24 2352 05/12/24 0341 05/12/24 0903 05/13/24 0504 05/14/24 0444  NA 120* 121* 123* 127* 128*  K 6.9* 5.7* 4.9 4.7 4.1  CL 91* 91* 94* 97* 93*  CO2 20* 21* 22 22 25   GLUCOSE 152* 189* 165* 127* 113*  BUN 35* 33* 29* 19 20  CREATININE 1.85* 1.61* 1.26* 0.99 1.01*  CALCIUM  8.6* 8.9 8.8* 9.0 8.9  MG 1.5*  --  1.7  --  1.4*  PHOS  --   --  3.7  --   --    Liver Function Tests: Recent Labs  Lab 05/11/24 2352 05/12/24 0903 05/14/24 0444  AST 18  --  20  ALT 15  --  15  ALKPHOS 48  --  55  BILITOT 0.4  --  0.6  PROT 6.6  --  7.1  ALBUMIN  3.5 3.5 3.6   CBG: Recent Labs  Lab 05/13/24 1201 05/13/24 1704 05/13/24 2057 05/14/24 0851 05/14/24 1219  GLUCAP 149* 144* 220* 173* 122*    Discharge time spent: less than 30 minutes.  Signed: Garnette Pelt,  MD [facility] 05/14/2024

## 2024-05-14 NOTE — Progress Notes (Signed)
 Mobility Specialist Progress Note;   05/14/24 1105  Mobility  Activity Ambulated with assistance  Level of Assistance Standby assist, set-up cues, supervision of patient - no hands on  Assistive Device Front wheel walker  Distance Ambulated (ft) 200 ft  Activity Response Tolerated well  Mobility Referral Yes  Mobility visit 1 Mobility  Mobility Specialist Start Time (ACUTE ONLY) 1105  Mobility Specialist Stop Time (ACUTE ONLY) 1123  Mobility Specialist Time Calculation (min) (ACUTE ONLY) 18 min   Pt agreeable to mobility. Required no physical assistance during ambulation, SV for safety. No c/o when asked during session. Pt returned back to bed and left with all needs met. RN present.   Lauraine Erm Mobility Specialist Please contact via SecureChat or [employer] [phone]

## 2024-05-14 NOTE — Progress Notes (Signed)
 OT Cancellation Note and Discharge  Patient Details Name: [name] MRN: [id number] DOB: 1951-06-23   Cancelled Treatment:    Reason Eval/Treat Not Completed: Other (comment). Pt politely declined any further OT stating she felt she was at baseline with ADLs and mobility and that she did not want to be charged for therapies. Pt reports having all DME/ADs and will continue to with OP PT when d/c home. OT will sign off   Jacques Karna Loose 05/14/2024, 11:15 AM

## 2024-05-14 NOTE — Progress Notes (Signed)
 Discharge instructions (including medications) discussed with and copy provided to patient. Patient given the opportunity to ask questions. Questions clarified.

## 2024-05-14 NOTE — TOC CM/SW Note (Signed)
 Transition of Care [facility]) - Inpatient Brief Assessment   Patient Details  Name: [name] MRN: [id number] Date of Birth: 09-04-1950  Transition of Care [facility]) CM/SW Contact:    Sudie Erminio Deems, RN Phone Number: 05/14/2024, 12:34 PM   Clinical Narrative: Patient presented for bradycardia. PTA patient states she is from home with spouse. Patient states she has DME: rolling walker and rollator. Patient is receiving physical therapy from [facility]. No home needs identified at this time. Patient states she will have transportation home.     Transition of Care Asessment: Insurance and Status: Insurance coverage has been reviewed Patient has primary care physician: Yes Home environment has been reviewed: reviewed Prior level of function:: independent Prior/Current Home Services: No current home services Social Drivers of Health Review: SDOH reviewed no interventions necessary Readmission risk has been reviewed: Yes Transition of care needs: no transition of care needs at this time

## 2024-05-14 NOTE — Plan of Care (Signed)
  Problem: Education: Goal: Ability to describe self-care measures that may prevent or decrease complications (Diabetes Survival Skills Education) will improve Outcome: Adequate for Discharge Goal: Individualized Educational Video(s) Outcome: Adequate for Discharge   Problem: Coping: Goal: Ability to adjust to condition or change in health will improve Outcome: Adequate for Discharge   Problem: Fluid Volume: Goal: Ability to maintain a balanced intake and output will improve Outcome: Adequate for Discharge   Problem: Health Behavior/Discharge Planning: Goal: Ability to identify and utilize available resources and services will improve Outcome: Adequate for Discharge Goal: Ability to manage health-related needs will improve Outcome: Adequate for Discharge   Problem: Metabolic: Goal: Ability to maintain appropriate glucose levels will improve Outcome: Adequate for Discharge   Problem: Nutritional: Goal: Maintenance of adequate nutrition will improve Outcome: Adequate for Discharge Goal: Progress toward achieving an optimal weight will improve Outcome: Adequate for Discharge   Problem: Skin Integrity: Goal: Risk for impaired skin integrity will decrease Outcome: Adequate for Discharge   Problem: Tissue Perfusion: Goal: Adequacy of tissue perfusion will improve Outcome: Adequate for Discharge   Problem: Education: Goal: Knowledge of General Education information will improve Description: Including pain rating scale, medication(s)/side effects and non-pharmacologic comfort measures Outcome: Adequate for Discharge   Problem: Health Behavior/Discharge Planning: Goal: Ability to manage health-related needs will improve Outcome: Adequate for Discharge   Problem: Clinical Measurements: Goal: Ability to maintain clinical measurements within normal limits will improve Outcome: Adequate for Discharge Goal: Will remain free from infection Outcome: Adequate for Discharge Goal:  Diagnostic test results will improve Outcome: Adequate for Discharge Goal: Respiratory complications will improve Outcome: Adequate for Discharge Goal: Cardiovascular complication will be avoided Outcome: Adequate for Discharge   Problem: Activity: Goal: Risk for activity intolerance will decrease Outcome: Adequate for Discharge   Problem: Nutrition: Goal: Adequate nutrition will be maintained Outcome: Adequate for Discharge   Problem: Coping: Goal: Level of anxiety will decrease Outcome: Adequate for Discharge   Problem: Elimination: Goal: Will not experience complications related to bowel motility Outcome: Adequate for Discharge Goal: Will not experience complications related to urinary retention Outcome: Adequate for Discharge   Problem: Pain Managment: Goal: General experience of comfort will improve and/or be controlled Outcome: Adequate for Discharge   Problem: Safety: Goal: Ability to remain free from injury will improve Outcome: Adequate for Discharge   Problem: Skin Integrity: Goal: Risk for impaired skin integrity will decrease Outcome: Adequate for Discharge   Problem: Acute Rehab OT Goals (only OT should resolve) Goal: Pt. Will Perform Upper Body Dressing Outcome: Adequate for Discharge Goal: Pt. Will Perform Lower Body Dressing Outcome: Adequate for Discharge Goal: Pt. Will Transfer To Toilet Outcome: Adequate for Discharge Goal: Pt. Will Perform Tub/Shower Transfer Outcome: Adequate for Discharge

## 2024-05-14 NOTE — Progress Notes (Signed)
  Progress Note  Patient Name: [name] Date of Encounter: 05/14/2024 [facility] Cardiologist: Jerel Balding, MD   Interval Summary   Resting comfortably in bed Eager to go home No acute complaints, doing well   Vital Signs Vitals:   05/13/24 2140 05/14/24 0049 05/14/24 0554 05/14/24 0813  BP: (!) 130/47 (!) 133/51 (!) 148/47   Pulse: 75 74 79 66  Resp:  20 18 16   Temp: 98.8 F (37.1 C) 98.6 F (37 C) 98 F (36.7 C) 98.1 F (36.7 C)  TempSrc: Oral Oral Oral Oral  SpO2: 99% 98% 99% 99%  Weight:   77.5 kg   Height:        Intake/Output Summary (Last 24 hours) at 05/14/2024 0902 Last data filed at 05/14/2024 0800 Gross per 24 hour  Intake 517 ml  Output --  Net 517 ml      05/14/2024    5:54 AM 05/13/2024    3:53 AM 05/11/2024   11:45 PM  Last 3 Weights  Weight (lbs) 170 lb 12.8 oz 172 lb 6.4 oz 168 lb  Weight (kg) 77.474 kg 78.2 kg 76.204 kg     Telemetry/ECG  Rate controlled A-Fib, HR 80-90s - Personally Reviewed  Physical Exam  GEN: No acute distress.   Neck: No JVD Cardiac: iRRR, no murmurs, rubs, or gallops.  Respiratory: Clear to auscultation bilaterally. GI: Soft, nontender, non-distended  MS: No edema  Assessment & Plan  Symptomatic bradycardia Permanent A-Fib, scheduled for LAAO 06/11/24 Presented to ED with symptomatic bradycardia in setting of electrolyte/renal derangement with nodal blocking use  Held metoprolol  and diltiazem   Stabilized renal function and electrolytes ? HR improved Remains in A-Fib with HR 80s  Continue Eliquis  5 mg BID Consolidated metoprolol , continue Toprol  50 mg BID Continue to hold diltiazem  for now    Chronic HFpEF Appears euvolemic  Home Lasix  was held in setting of AKI Renal function stable today Continue PO torsemide  40 mg daily with mag ox 200 mg daily    Hypertension  BP remains elevated this admission  Continue lisinopril  10 mg daily    Hyperlipidemia 12/11/2023: HDL 37; LDL Chol Calc (NIH)  34 05/11/2024: ALT 15  Continue Crestor  10 mg daily    For questions or updates, please contact [facility] Please consult [email or web address] for contact info under         Signed, Waddell Janice Donath, PA-C

## 2024-05-18 DIAGNOSIS — M1611 Unilateral primary osteoarthritis, right hip: Secondary | ICD-10-CM | POA: Diagnosis not present

## 2024-05-18 DIAGNOSIS — Z9181 History of falling: Secondary | ICD-10-CM | POA: Diagnosis not present

## 2024-05-19 ENCOUNTER — Encounter: Admitting: Internal Medicine

## 2024-05-20 ENCOUNTER — Ambulatory Visit (INDEPENDENT_AMBULATORY_CARE_PROVIDER_SITE_OTHER)

## 2024-05-20 ENCOUNTER — Other Ambulatory Visit: Payer: Self-pay | Admitting: Cardiovascular Disease

## 2024-05-20 ENCOUNTER — Encounter: Payer: Self-pay | Admitting: Internal Medicine

## 2024-05-20 VITALS — BP 125/54 | HR 63 | Temp 97.6°F | Resp 16 | Ht 62.0 in | Wt 173.6 lb

## 2024-05-20 DIAGNOSIS — D509 Iron deficiency anemia, unspecified: Secondary | ICD-10-CM

## 2024-05-20 DIAGNOSIS — D500 Iron deficiency anemia secondary to blood loss (chronic): Secondary | ICD-10-CM

## 2024-05-20 MED ORDER — SODIUM CHLORIDE 0.9 % IV SOLN
510.0000 mg | Freq: Once | INTRAVENOUS | Status: AC
  Administered 2024-05-20: 510 mg via INTRAVENOUS
  Filled 2024-05-20: qty 17

## 2024-05-20 NOTE — Progress Notes (Unsigned)
 Cardiology Office Note   Date:  05/21/2024  ID:  [name], DOB [birth year], MRN [id number] PCP: Rollene Almarie LABOR, MD  [facility] Providers Cardiologist:  Jerel Balding, MD Electrophysiologist:  Fonda Kitty, MD   History of Present Illness [name] is a 73 y.o. female who presented with permanent atrial fibrillation and recurrent) anemia due to GI bleeding who is being seen for evaluation of eligibility for Watchman of left atrial appendage occlusion on 05/01/2024.  She was seen in the hospital for multiple cardiac complaints including history of CAD (stent to PLB artery in 2009), chronic HFpEF, permanent atrial fibrillation, DM2, HTN, history of pericardiocentesis for tamponade in 2020, history of recurrent GI bleeding, history of recurrent falls, being evaluated for possible Watchman left atrial appendage occluder device.  Has had serious intravenous contrast allergy and has been unable to assess her candidacy with the CT angiogram.  She is scheduled for transesophageal echocardiogram.  On TEE the left atrial appendage measurements were included.  Severely dilated.  No left atrial/left atrial appendage thrombus was indicated at that time.  Right atrial size was also severely dilated.  Mild mitral valve leak.  Aortic valve regurgitation was mild.  Moderate grade 3 protruding plaque involving the ascending aorta.  A 27 mm Watchman device was recommended.  Today, she presents with a history of atrial fibrillation and COPD for follow-up after a transesophageal echocardiogram and to discuss the upcoming Watchman procedure.  She recently underwent a transesophageal echocardiogram (TEE) to assess cardiac valve function in preparation for a Watchman device implantation scheduled for the 30th of this month. She has not yet had the device implanted.  She has lab work scheduled with [facility] next week.  She experiences gastrointestinal bleeding and anemia, which contraindicate the  use of blood thinners, making her a candidate for the Watchman procedure. Her hemoglobin level is currently 9.0 g/dL. She received an iron  infusion yesterday and has another scheduled for the 15th.  She manages chronic obstructive pulmonary disease (COPD) with inhaled medications, including Adora and albuterol , which help control her chronic shortness of breath.  She takes torsemide  40 mg twice daily. Her potassium level was recently elevated at 6.0 mmol/L, resulting in hospitalization, and her magnesium  was low. She will have lab work on the 13th to recheck her electrolytes.  She is in atrial fibrillation consistently and is on rate control medication. Her heart rate was previously low at 37 bpm when her potassium levels were high. She has a history of electrolyte imbalances, and her wife suggests more frequent monitoring of her blood work.   Reports no chest pain, pressure, or tightness. No edema, orthopnea, PND.   Discussed the use of AI scribe software for clinical note transcription with the patient, who gave verbal consent to proceed.  ROS: pertinent ROS in HPI  Studies Reviewed     Transesophageal echocardiogram 05/01/24  PROCEDURE: After discussion of the risks and benefits of a TEE, an  informed consent was obtained. The transesophogeal probe was passed  without difficulty through the esophogus of the patient. Sedation  performed by different physician. The patient  developed no complications during the procedure.    IMPRESSIONS     1. Left ventricular ejection fraction, by estimation, is 55 to 60%. The  left ventricle has normal function. The left ventricle has no regional  wall motion abnormalities. Left ventricular diastolic function could not  be evaluated.   2. Right ventricular systolic function is moderately reduced. The right  ventricular  size is moderately enlarged. Tricuspid regurgitation signal is  inadequate for assessing PA pressure.   3. Left atrial appendage  measurements      00 degrees----diameter 18 mm----depth 17 mm      45 degrees----diameter 18 mm----depth 14 mm      90 degrees----diameter 23 mm----depth 19 mm      135 degrees---diameter 23 mm----depth 20 mm      . Left atrial size was severely dilated. No left atrial/left atrial  appendage thrombus was detected.   4. Right atrial size was severely dilated.   5. The mitral valve is normal in structure. Mild mitral valve  regurgitation.   6. The aortic valve is tricuspid. Aortic valve regurgitation is mild. No  aortic stenosis is present.   7. There is Moderate (Grade III) protruding plaque involving the  descending aorta.   8. The thin secundum part of the atrial septum is extensive, allowing  broad options for transseptal puncture.   9. 3D performed of the LAA and demonstrates anatomy amenable to Watchman  device deployment (probably 27 mm device).   FINDINGS   Left Ventricle: Left ventricular ejection fraction, by estimation, is 55  to 60%. The left ventricle has normal function. The left ventricle has no  regional wall motion abnormalities. The left ventricular internal cavity  size was normal in size. Left  ventricular diastolic function could not be evaluated due to atrial  fibrillation. Left ventricular diastolic function could not be evaluated.   Right Ventricle: The right ventricular size is moderately enlarged. No  increase in right ventricular wall thickness. Right ventricular systolic  function is moderately reduced. Tricuspid regurgitation signal is  inadequate for assessing PA pressure.   Left Atrium: Left atrial appendage measurements  00 degrees----diameter 18 mm----depth 17 mm  45 degrees----diameter 18 mm----depth 14 mm  90 degrees----diameter 23 mm----depth 19 mm  135 degrees---diameter 23 mm----depth 20 mm.  Left atrial size was severely dilated. No left atrial/left atrial  appendage thrombus was detected.   Right Atrium: Right atrial size was severely  dilated.   Pericardium: There is no evidence of pericardial effusion.   Mitral Valve: The mitral valve is normal in structure. Mild mitral valve  regurgitation, with centrally-directed jet.   Tricuspid Valve: The tricuspid valve is normal in structure. Tricuspid  valve regurgitation is mild.   Aortic Valve: The aortic valve is tricuspid. Aortic valve regurgitation is  mild. No aortic stenosis is present.   Pulmonic Valve: The pulmonic valve was grossly normal. Pulmonic valve  regurgitation is not visualized. No evidence of pulmonic stenosis.   Aorta: The aortic root, ascending aorta, aortic arch and descending aorta  are all structurally normal, with no evidence of dilitation or  obstruction. There is moderate (Grade III) protruding plaque involving the  descending aorta.   IAS/Shunts: No atrial level shunt detected by color flow Doppler. The thin  secundum part of the atrial septum is extensive, allowing broad options  for transseptal puncture.   [name] MD  Electronically signed by Jerel Balding MD  Signature Date/Time: 05/01/2024/6:23:31 PM   Physical Exam VS:  BP (!) 134/58   Pulse 86   Ht 5' 2 (1.575 m)   Wt 169 lb (76.7 kg)   SpO2 95%   BMI 30.91 kg/m        Wt Readings from Last 3 Encounters:  05/21/24 169 lb (76.7 kg)  05/20/24 173 lb 9.6 oz (78.7 kg)  05/14/24 170 lb 12.8 oz (77.5 kg)  GEN: Well nourished, well developed in no acute distress NECK: No JVD; No carotid bruits CARDIAC: IRIR, no murmurs, rubs, gallops RESPIRATORY:  Clear to auscultation without rales, wheezing or rhonchi  ABDOMEN: Soft, non-tender, non-distended EXTREMITIES:  No edema; No deformity   ASSESSMENT AND PLAN  Chronic diastolic heart failure Chronic diastolic heart failure with well-managed symptoms and stable weight. Diuretics used for fluid management. - Continue torsemide  40 mg twice daily. - Monitor weight regularly and report significant changes.  Chronic atrial  fibrillation Chronic atrial fibrillation with rate control managed by medication. Elevated potassium levels affected heart rate. Watchman procedure planned to reduce anticoagulation due to bleeding risks. TEE confirmed suitability for a 27 mm device. - Proceed with Watchman procedure on October 30th. Labs next week .  - Monitor potassium levels regularly. - Order lab work on October 13th to include potassium and magnesium  levels.  Electrolyte disturbances (hypokalemia, hyperkalemia, hypomagnesemia) Electrolyte disturbances with recent hospitalization for hyperkalemia. Low magnesium  levels noted. Regular monitoring necessary due to diuretic use and past fluctuations. - Order lab work on October 13th to include potassium and magnesium  levels. - Plan for monthly electrolyte monitoring.  Iron  deficiency anemia due to chronic gastrointestinal blood loss Chronic iron  deficiency anemia with hemoglobin levels fluctuating between 8-9 g/dL. Recent iron  infusion administered, with another scheduled. Oral iron  supplements not tolerated. - Administer iron  infusion on October 15th. - Recheck hemoglobin levels on October 13th. - Continue monitoring hemoglobin levels regularly.  Chronic obstructive pulmonary disease (COPD) Chronic obstructive pulmonary disease with chronic dyspnea. Managed with inhaled medications. - Continue Anora and albuterol  as needed for symptom control.   Nondisplaced fracture of base of right fifth metatarsal Nondisplaced fracture managed with a flat-bottom shoe and Ace wrap. Surgery not indicated. - Continue using flat-bottom shoe and Ace wrap for support. - Monitor for pain and healing progress.      Dispo: She can follow-up as already outlined.   Signed, Orren LOISE Fabry, PA-C

## 2024-05-20 NOTE — Progress Notes (Signed)
 Diagnosis: Acute Anemia  Provider:  Mannam, Praveen MD  Procedure: IV Infusion  IV Type: Peripheral, IV Location: L Antecubital  Feraheme  (Ferumoxytol ), Dose: 510 mg  Infusion Start Time: 1553  Infusion Stop Time: 1611  Post Infusion IV Care: Observation period completed and Peripheral IV Discontinued. 15 minute observation per patient request.  Discharge: Condition: Good, Destination: Home . AVS Declined  Performed by:  Rocky FORBES Sar, RN

## 2024-05-21 ENCOUNTER — Telehealth: Payer: Self-pay

## 2024-05-21 ENCOUNTER — Other Ambulatory Visit: Payer: Self-pay | Admitting: Neurology

## 2024-05-21 ENCOUNTER — Other Ambulatory Visit: Payer: Self-pay | Admitting: Internal Medicine

## 2024-05-21 ENCOUNTER — Ambulatory Visit: Attending: Physician Assistant | Admitting: Physician Assistant

## 2024-05-21 ENCOUNTER — Encounter: Payer: Self-pay | Admitting: Physician Assistant

## 2024-05-21 VITALS — BP 134/58 | HR 86 | Ht 62.0 in | Wt 169.0 lb

## 2024-05-21 DIAGNOSIS — I4819 Other persistent atrial fibrillation: Secondary | ICD-10-CM

## 2024-05-21 DIAGNOSIS — I5032 Chronic diastolic (congestive) heart failure: Secondary | ICD-10-CM

## 2024-05-21 DIAGNOSIS — D500 Iron deficiency anemia secondary to blood loss (chronic): Secondary | ICD-10-CM

## 2024-05-21 DIAGNOSIS — Z79899 Other long term (current) drug therapy: Secondary | ICD-10-CM | POA: Diagnosis not present

## 2024-05-21 DIAGNOSIS — K922 Gastrointestinal hemorrhage, unspecified: Secondary | ICD-10-CM

## 2024-05-21 DIAGNOSIS — D649 Anemia, unspecified: Secondary | ICD-10-CM | POA: Diagnosis not present

## 2024-05-21 DIAGNOSIS — D6869 Other thrombophilia: Secondary | ICD-10-CM | POA: Diagnosis not present

## 2024-05-21 DIAGNOSIS — I4821 Permanent atrial fibrillation: Secondary | ICD-10-CM

## 2024-05-21 DIAGNOSIS — M1611 Unilateral primary osteoarthritis, right hip: Secondary | ICD-10-CM | POA: Diagnosis not present

## 2024-05-21 DIAGNOSIS — Z9181 History of falling: Secondary | ICD-10-CM | POA: Diagnosis not present

## 2024-05-21 NOTE — Telephone Encounter (Signed)
 Copied from CRM [id number]. Topic: Referral - Request for Referral >> May 21, 2024  8:57 AM Rea ORN wrote: Did the patient discuss referral with their provider in the last year? Yes (If No - schedule appointment) (If Yes - send message)  Appointment offered? No  Type of order/referral and detailed reason for visit: PT for balance, gait instability, conditioning  Preference of office, provider, location: [facility] [address], [place], [place] [address], Elvie Chin  If referral order, have you been seen by this specialty before? Yes (If Yes, this issue or another issue? When? Where?  Can we respond through MyChart? Yes or Call

## 2024-05-21 NOTE — Patient Instructions (Addendum)
 Medication Instructions:   Your physician recommends that you continue on your current medications as directed. Please refer to the Current Medication list given to you today.   *If you need a refill on your cardiac medications before your next appointment, please call your pharmacy*    Lab Work:  PLEASE GO DOWN STAIRS  [facility]  FIRST FLOOR   ( GET OFF ELEVATORS WALK TOWARDS WAITING AREA LAB LOCATED BY PHARMACY):   RETURN IN  NOVEMBER  11 -2025    BMET AND CBC  TODAY       If you have labs (blood work) drawn today and your tests are completely normal, you will receive your results only by: MyChart Message (if you have MyChart) OR A paper copy in the mail If you have any lab test that is abnormal or we need to change your treatment, we will call you to review the results.    Testing/Procedures:NONE ORDERED  TODAY   Follow-Up: At [facility], you and your health needs are our priority.  As part of our continuing mission to provide you with exceptional heart care, our providers are all part of one team.  This team includes your primary Cardiologist (physician) and Advanced Practice Providers or APPs (Physician Assistants and Nurse Practitioners) who all work together to provide you with the care you need, when you need it.   Your next appointment:   3 month(s)   Provider:     Jerel Balding, MD    We recommend signing up for the patient portal called MyChart.  Sign up information is provided on this After Visit Summary.  MyChart is used to connect with patients for Virtual Visits (Telemedicine).  Patients are able to view lab/test results, encounter notes, upcoming appointments, etc.  Non-urgent messages can be sent to your provider as well.   To learn more about what you can do with MyChart, go to [email or web address].   Other Instructions     Heart-Healthy Eating Plan  Eating a healthy diet is important for the health of your heart. A heart-healthy eating plan  includes: Eating less unhealthy fats. Eating more healthy fats. Eating less salt in your food. Salt is also called sodium. Making other changes in your diet. Talk with your doctor or a diet specialist (dietitian) to create an eating plan that is right for you. What is my plan? Your doctor may recommend an eating plan that includes: Total fat: ______% or less of total calories a day. Saturated fat: ______% or less of total calories a day. Cholesterol: less than _________mg a day. Sodium: less than _________mg a day. What are tips for following this plan? Cooking Avoid frying your food. Try to bake, boil, grill, or broil it instead. You can also reduce fat by: Removing the skin from poultry. Removing all visible fats from meats. Steaming vegetables in water  or broth. Meal planning  At meals, divide your plate into four equal parts: Fill one-half of your plate with vegetables and green salads. Fill one-fourth of your plate with whole grains. Fill one-fourth of your plate with lean protein foods. Eat 2-4 cups of vegetables per day. One cup of vegetables is: 1 cup (91 g) broccoli or cauliflower florets. 2 medium carrots. 1 large bell pepper. 1 large sweet potato. 1 large tomato. 1 medium white potato. 2 cups (150 g) raw leafy greens. Eat 1-2 cups of fruit per day. One cup of fruit is: 1 small apple 1 large banana 1 cup (237 g) mixed  fruit, 1 large orange,  cup (82 g) dried fruit, 1 cup (240 mL) 100% fruit juice. Eat more foods that have soluble fiber. These are apples, broccoli, carrots, beans, peas, and barley. Try to get 20-30 g of fiber per day. Eat 4-5 servings of nuts, legumes, and seeds per week: 1 serving of dried beans or legumes equals  cup (90 g) cooked. 1 serving of nuts is  oz (12 almonds, 24 pistachios, or 7 walnut halves). 1 serving of seeds equals  oz (8 g). General information Eat more home-cooked food. Eat less restaurant, buffet, and fast food. Limit  or avoid alcohol. Limit foods that are high in starch and sugar. Avoid fried foods. Lose weight if you are overweight. Keep track of how much salt (sodium) you eat. This is important if you have high blood pressure. Ask your doctor to tell you more about this. Try to add vegetarian meals each week. Fats Choose healthy fats. These include olive oil and canola oil, flaxseeds, walnuts, almonds, and seeds. Eat more omega-3 fats. These include salmon, mackerel, sardines, tuna, flaxseed oil, and ground flaxseeds. Try to eat fish at least 2 times each week. Check food labels. Avoid foods with trans fats or high amounts of saturated fat. Limit saturated fats. These are often found in animal products, such as meats, butter, and cream. These are also found in plant foods, such as palm oil, palm kernel oil, and coconut oil. Avoid foods with partially hydrogenated oils in them. These have trans fats. Examples are stick margarine, some tub margarines, cookies, crackers, and other baked goods. What foods should I eat? Fruits All fresh, canned (in natural juice), or frozen fruits. Vegetables Fresh or frozen vegetables (raw, steamed, roasted, or grilled). Green salads. Grains Most grains. Choose whole wheat and whole grains most of the time. Rice and pasta, including brown rice and pastas made with whole wheat. Meats and other proteins Lean, well-trimmed beef, veal, pork, and lamb. Chicken and [place] without skin. All fish and shellfish. Wild duck, rabbit, pheasant, and venison. Egg whites or low-cholesterol egg substitutes. Dried beans, peas, lentils, and tofu. Seeds and most nuts. Dairy Low-fat or nonfat cheeses, including ricotta and mozzarella. Skim or 1% milk that is liquid, powdered, or evaporated. Buttermilk that is made with low-fat milk. Nonfat or low-fat yogurt. Fats and oils Non-hydrogenated (trans-free) margarines. Vegetable oils, including soybean, sesame, sunflower, olive, peanut, safflower,  corn, canola, and cottonseed. Salad dressings or mayonnaise made with a vegetable oil. Beverages Mineral water . Coffee and tea. Diet carbonated beverages. Sweets and desserts Sherbet, gelatin, and fruit ice. Small amounts of dark chocolate. Limit all sweets and desserts. Seasonings and condiments All seasonings and condiments. The items listed above may not be a complete list of foods and drinks you can eat. Contact a dietitian for more options. What foods should I avoid? Fruits Canned fruit in heavy syrup. Fruit in cream or butter sauce. Fried fruit. Limit coconut. Vegetables Vegetables cooked in cheese, cream, or butter sauce. Fried vegetables. Grains Breads that are made with saturated or trans fats, oils, or whole milk. Croissants. Sweet rolls. Donuts. High-fat crackers, such as cheese crackers. Meats and other proteins Fatty meats, such as hot dogs, ribs, sausage, bacon, rib-eye roast or steak. High-fat deli meats, such as salami and bologna. Caviar. Domestic duck and goose. Organ meats, such as liver. Dairy Cream, sour cream, cream cheese, and creamed cottage cheese. Whole-milk cheeses. Whole or 2% milk that is liquid, evaporated, or condensed. Whole buttermilk. Cream sauce or  high-fat cheese sauce. Yogurt that is made from whole milk. Fats and oils Meat fat, or shortening. Cocoa butter, hydrogenated oils, palm oil, coconut oil, palm kernel oil. Solid fats and shortenings, including bacon fat, salt pork, lard, and butter. Nondairy cream substitutes. Salad dressings with cheese or sour cream. Beverages Regular sodas and juice drinks with added sugar. Sweets and desserts Frosting. Pudding. Cookies. Cakes. Pies. Milk chocolate or white chocolate. Buttered syrups. Full-fat ice cream or ice cream drinks. The items listed above may not be a complete list of foods and drinks to avoid. Contact a dietitian for more information. Summary Heart-healthy meal planning includes eating less  unhealthy fats, eating more healthy fats, and making other changes in your diet. Eat a balanced diet. This includes fruits and vegetables, low-fat or nonfat dairy, lean protein, nuts and legumes, whole grains, and heart-healthy oils and fats. This information is not intended to replace advice given to you by your health care provider. Make sure you discuss any questions you have with your health care provider. Document Revised: 09/04/2021 Document Reviewed: 09/04/2021 Elsevier Patient Education  2024 [employer].

## 2024-05-24 NOTE — Progress Notes (Unsigned)
 Cardiology Office Note:  .   Date:  05/24/2024  ID:  [name] Factor, DOB 04-01-1951, MRN [id number] PCP: Rollene Almarie LABOR, MD  [facility] Providers Cardiologist:  Jerel Balding, MD Electrophysiologist:  Fonda Kitty, MD {  History of Present Illness: .   [name] is a 73 y.o. female w/PMHx of  HTN, HLD, DM, COPD, IDA CAD (posterior lateral ventricular branch stent in 2009) Chronic diastolic CHF AFib  Tendencies towards falls/injuries pericardiocentesis for hemothorax with tamponade in June 2020  GIB in 2022 (to a Hgb of 6)  Referred to Dr. Kitty 03/13/24 for consideration of Watchman Given contrast allergy > planned for TEE to evaluate anatomy  TEE 05/01/24 Severe biatrial dilation. Appendage shape and measurements appropriate for Watchman device, probably 27 mm. No thrombus seen  Admitted 05/12/24 Lightheaded, DOE, AFib 40's, admitted for symptomatic bradycardia Home dilt and metop held, lytes corrected (hyperkalemia. Low magnesium  level) Given AFib resumed metop tart 50mg  BID (1/2 home dose) then her dilt 120mg  daily (prior was 180) Discharged 05/14/24  Saw cards team 05/21/24 Planned for more frequent labs/lytes Volume stable  Scheduled for watchman on 06/11/24, 1st case  Today's visit is scheduled as her pre-watchman visit ROS:   She comes today accompanied by her wife. She has done well with her current medications She is no longer really aware of her AFib Denies any CP, SOB No near syncope or syncope, no dizzy spells. Reports good medication compliance  We discussed the importance of no missed doses of her eliquis  leading up to her procedure including the evening before, not the morning of. We discussed day of her procedure expectations, likely to go home same day, though may stay if needed. Discussed there is 6 mo post procedure of blood thinner >> with this she points out that she can not take Plavix >> discussed this likely means that she  will be on Eliquis  the whole 6 months Discussed pre-procedure prednisone /pre-contrast allergy medication needed Discussed groin/site care/restrictions post procedure Discussed pre-procedure soap scrub/use ahead of her procedure   Arrhythmia/AAD hx AFib No AAD to date  Studies Reviewed: [name]    EKG done today and reviewed by myself:  AFib 82bpm, LAD  05/01/24: TEE  1. Left ventricular ejection fraction, by estimation, is 55 to 60%. The  left ventricle has normal function. The left ventricle has no regional  wall motion abnormalities. Left ventricular diastolic function could not  be evaluated.   2. Right ventricular systolic function is moderately reduced. The right  ventricular size is moderately enlarged. Tricuspid regurgitation signal is  inadequate for assessing PA pressure.   3. Left atrial appendage measurements      00 degrees----diameter 18 mm----depth 17 mm      45 degrees----diameter 18 mm----depth 14 mm      90 degrees----diameter 23 mm----depth 19 mm      135 degrees---diameter 23 mm----depth 20 mm      . Left atrial size was severely dilated. No left atrial/left atrial  appendage thrombus was detected.   4. Right atrial size was severely dilated.   5. The mitral valve is normal in structure. Mild mitral valve  regurgitation.   6. The aortic valve is tricuspid. Aortic valve regurgitation is mild. No  aortic stenosis is present.   7. There is Moderate (Grade III) protruding plaque involving the  descending aorta.   8. The thin secundum part of the atrial septum is extensive, allowing  broad options for transseptal puncture.  9. 3D performed of the LAA and demonstrates anatomy amenable to Watchman  device deployment (probably 27 mm device).   She had a nuclear stress test in June 12, 2021 showed low risk findings without evidence of ischemia or previous infarction.   Her last echocardiogram in A October 2023 showed LVEF of 60-65%.  The left atrium is moderately  dilated (ESVI 39 mL/m).  There were no serious valvular abnormalities.  She had a normal perfusion pattern on her nuclear stress tests from 2012, 2014, 2016 and October 2022.  Risk Assessment/Calculations:    Physical Exam:   VS:  There were no vitals taken for this visit.   Wt Readings from Last 3 Encounters:  05/21/24 169 lb (76.7 kg)  05/20/24 173 lb 9.6 oz (78.7 kg)  05/14/24 170 lb 12.8 oz (77.5 kg)    GEN: Well nourished, well developed in no acute distress NECK: No JVD; No carotid bruits CARDIAC: irreg-irreg, no murmurs, rubs, gallops RESPIRATORY:   CTA b/l without rales, wheezing or rhonchi  ABDOMEN: Soft, non-tender, non-distended EXTREMITIES:  No edema; No deformity   ASSESSMENT AND PLAN: .    Longstanding persistent AFib CHA2DS2Vasc is 7, on Eliquis , appropriately dosed Recent admission with brady, meds adjusted Doing well, rate controlled since  Pending watchman  Will go ahead and repeat labs today given electrolyte imbalance of late Advised my MA Provide soap and instructions   Secondary hypercoagulable state 2/2 AFib    Dispo: usual post procedure follow up    Signed, Janice Macario Arthur, PA-C

## 2024-05-25 ENCOUNTER — Ambulatory Visit: Payer: Self-pay | Admitting: Cardiology

## 2024-05-25 ENCOUNTER — Ambulatory Visit: Attending: Physician Assistant | Admitting: Physician Assistant

## 2024-05-25 VITALS — BP 148/60 | HR 72 | Ht 62.0 in | Wt 168.0 lb

## 2024-05-25 DIAGNOSIS — Z01818 Encounter for other preprocedural examination: Secondary | ICD-10-CM

## 2024-05-25 DIAGNOSIS — D6869 Other thrombophilia: Secondary | ICD-10-CM

## 2024-05-25 DIAGNOSIS — I4811 Longstanding persistent atrial fibrillation: Secondary | ICD-10-CM

## 2024-05-25 DIAGNOSIS — Z9861 Coronary angioplasty status: Secondary | ICD-10-CM | POA: Diagnosis not present

## 2024-05-25 DIAGNOSIS — M1611 Unilateral primary osteoarthritis, right hip: Secondary | ICD-10-CM | POA: Diagnosis not present

## 2024-05-25 DIAGNOSIS — I2510 Atherosclerotic heart disease of native coronary artery without angina pectoris: Secondary | ICD-10-CM | POA: Diagnosis not present

## 2024-05-25 DIAGNOSIS — Z9181 History of falling: Secondary | ICD-10-CM | POA: Diagnosis not present

## 2024-05-25 NOTE — Addendum Note (Signed)
 Addended by: Christinamarie Tall A on: 05/25/2024 03:54 PM   Modules accepted: Orders

## 2024-05-25 NOTE — Patient Instructions (Addendum)
 Medication Instructions:   YOU WILL BE CONTACTED BACK  WITH  PREDNISONE  AND BENADRYL   INSTRUCTIONS PRIOR TO  PROCEDURE      *If you need a refill on your cardiac medications before your next appointment, please call your pharmacy*   Lab Work:  PLEASE GO DOWN STAIRS  [facility]  FIRST FLOOR   ( GET OFF ELEVATORS WALK TOWARDS WAITING AREA LAB LOCATED BY PHARMACY):  BMET MAG AND CBC       If you have labs (blood work) drawn today and your tests are completely normal, you will receive your results only by: MyChart Message (if you have MyChart) OR A paper copy in the mail If you have any lab test that is abnormal or we need to change your treatment, we will call you to review the results.    Testing/Procedures: NONE ORDERED  TODAY    Follow-Up: At [facility], you and your health needs are our priority.  As part of our continuing mission to provide you with exceptional heart care, our providers are all part of one team.  This team includes your primary Cardiologist (physician) and Advanced Practice Providers or APPs (Physician Assistants and Nurse Practitioners) who all work together to provide you with the care you need, when you need it.   Your next appointment:  WILL BE CONTACTED BACK  WITH SCHEDULING          We recommend signing up for the patient portal called MyChart.  Sign up information is provided on this After Visit Summary.  MyChart is used to connect with patients for Virtual Visits (Telemedicine).  Patients are able to view lab/test results, encounter notes, upcoming appointments, etc.  Non-urgent messages can be sent to your provider as well.   To learn more about what you can do with MyChart, go to [email or web address].   Other Instructions

## 2024-05-26 ENCOUNTER — Encounter: Payer: Self-pay | Admitting: Internal Medicine

## 2024-05-26 ENCOUNTER — Ambulatory Visit: Payer: Self-pay | Admitting: Physician Assistant

## 2024-05-26 ENCOUNTER — Other Ambulatory Visit: Payer: Self-pay

## 2024-05-26 DIAGNOSIS — Z79899 Other long term (current) drug therapy: Secondary | ICD-10-CM

## 2024-05-26 DIAGNOSIS — R2681 Unsteadiness on feet: Secondary | ICD-10-CM

## 2024-05-26 LAB — BASIC METABOLIC PANEL WITH GFR
BUN/Creatinine Ratio: 19 (ref 12–28)
BUN: 30 mg/dL — AB (ref 8–27)
CO2: 23 mmol/L (ref 20–29)
Calcium: 8.7 mg/dL (ref 8.7–10.3)
Chloride: 88 mmol/L — AB (ref 96–106)
Creatinine, Ser: 1.62 mg/dL — AB (ref 0.57–1.00)
Glucose: 168 mg/dL — AB (ref 70–99)
Potassium: 4 mmol/L (ref 3.5–5.2)
Sodium: 126 mmol/L — AB (ref 134–144)
eGFR: 33 mL/min/1.73 — AB (ref >59)

## 2024-05-26 LAB — CBC
Hematocrit: 28 % — ABNORMAL LOW (ref 34.0–46.6)
Hemoglobin: 8.8 g/dL — ABNORMAL LOW (ref 11.1–15.9)
MCH: 27.5 pg (ref 26.6–33.0)
MCHC: 31.4 g/dL — ABNORMAL LOW (ref 31.5–35.7)
MCV: 88 fL (ref 79–97)
Platelets: 216 x10E3/uL (ref 150–450)
RBC: 3.2 x10E6/uL — ABNORMAL LOW (ref 3.77–5.28)
RDW: 13.1 % (ref 11.7–15.4)
WBC: 6.2 x10E3/uL (ref 3.4–10.8)

## 2024-05-26 LAB — MAGNESIUM: Magnesium: 1.2 mg/dL — AB (ref 1.6–2.3)

## 2024-05-26 MED ORDER — MAGNESIUM OXIDE 400 MG PO TABS: 400.0000 mg | ORAL_TABLET | Freq: Every day | ORAL | 3 refills | Status: DC

## 2024-05-26 MED ORDER — TORSEMIDE 20 MG PO TABS: ORAL_TABLET | ORAL | 3 refills | Status: DC

## 2024-05-26 NOTE — Telephone Encounter (Signed)
 I have sent over to you to co-sign please let me know if input this correctly

## 2024-05-26 NOTE — Telephone Encounter (Signed)
 Lvm for spouse to call back for recommendations

## 2024-05-26 NOTE — Addendum Note (Signed)
 Addended by: RUBBIE KERRI HERO on: 05/26/2024 03:10 PM   Modules accepted: Orders

## 2024-05-26 NOTE — Telephone Encounter (Signed)
-----   Message from Charlies Macario Arthur sent at 05/26/2024  9:22 AM EDT ----- Creatinine (renal function) appears to wax/wane of late, though in general, appears a little lower then usual.   Her potassium is perfect He magnesium  is low again.  Reduce her torsemide  from 40mg  BID >>> to alternating days (40mg  BID alternating with 40mg  once daily) Start Mag ox 400mg  daily  Repeat BMET/mag level in 7-10 days    ----- Message ----- From: Interface, [facility] Lab Results In Sent: 05/25/2024  11:36 PM EDT To: Charlies Macario Arthur, PA-C

## 2024-05-26 NOTE — Telephone Encounter (Signed)
 Patient is returning call. Please advise?

## 2024-05-27 ENCOUNTER — Ambulatory Visit

## 2024-05-27 VITALS — BP 138/73 | HR 77 | Temp 97.5°F | Resp 14 | Ht 62.0 in | Wt 168.0 lb

## 2024-05-27 DIAGNOSIS — D509 Iron deficiency anemia, unspecified: Secondary | ICD-10-CM

## 2024-05-27 DIAGNOSIS — D500 Iron deficiency anemia secondary to blood loss (chronic): Secondary | ICD-10-CM

## 2024-05-27 MED ORDER — SODIUM CHLORIDE 0.9 % IV SOLN
510.0000 mg | Freq: Once | INTRAVENOUS | Status: AC
  Administered 2024-05-27: 510 mg via INTRAVENOUS
  Filled 2024-05-27: qty 17

## 2024-05-27 NOTE — Telephone Encounter (Signed)
 Wife of patient called. Thought the referral was going to [facility] and called to clarify. Informed her that the referral was sent to [facility] and to give them 2-3 days to receive the order.

## 2024-05-27 NOTE — Progress Notes (Signed)
 Diagnosis: Iron  Deficiency Anemia  Provider:  Praveen Mannam MD  Procedure: IV Infusion  IV Type: Peripheral, IV Location: L Antecubital  Feraheme  (Ferumoxytol ), Dose: 510 mg  Infusion Start Time: 1553  Infusion Stop Time: 1610  Post Infusion IV Care: Patient declined observation and Peripheral IV Discontinued  Discharge: Condition: Good, Destination: Home . AVS Declined  Performed by:  Rocky Sar, RN

## 2024-05-28 DIAGNOSIS — Z9181 History of falling: Secondary | ICD-10-CM | POA: Diagnosis not present

## 2024-05-28 DIAGNOSIS — M1611 Unilateral primary osteoarthritis, right hip: Secondary | ICD-10-CM | POA: Diagnosis not present

## 2024-05-29 ENCOUNTER — Telehealth: Payer: Self-pay

## 2024-05-29 DIAGNOSIS — R2681 Unsteadiness on feet: Secondary | ICD-10-CM

## 2024-05-29 NOTE — Telephone Encounter (Signed)
 I have called [facility] and was unable to speak with someone

## 2024-05-29 NOTE — Telephone Encounter (Signed)
 Copied from CRM #[id number]. Topic: Referral - Prior Authorization Question >> May 29, 2024 12:06 PM Aisha D wrote: Reason for CRM: Pt's wife Sharman is requesting to speak with the clinical manager in regards to the PA request for PT. Sharman stated that [facility] informed them that they never received a request for PT. Sharman stated that the referral should have been sent to the [facility] by the [facility]. I informed Sharman that the referral was closed due to no response. Sharman stated that she needs the clinical manager to give her a callback today as soon as possible.

## 2024-06-01 ENCOUNTER — Telehealth: Payer: Self-pay

## 2024-06-01 DIAGNOSIS — M1611 Unilateral primary osteoarthritis, right hip: Secondary | ICD-10-CM | POA: Diagnosis not present

## 2024-06-01 DIAGNOSIS — Z9181 History of falling: Secondary | ICD-10-CM | POA: Diagnosis not present

## 2024-06-01 NOTE — Telephone Encounter (Signed)
 I have called [facility] again and unable to get in connect with someone on there office I have spoke with my supervisor about this and she will get me a rep number to try and contact her.

## 2024-06-01 NOTE — Telephone Encounter (Signed)
 I have called and spoke with someone and they stated that they will have to transfer me to someone in medical records and when that happen the call ended up dropping and I was able to leave a voicemail.

## 2024-06-01 NOTE — Addendum Note (Signed)
 Addended by: ROSALVA LEX RAMAN on: 06/01/2024 01:59 PM   Modules accepted: Orders

## 2024-06-02 ENCOUNTER — Other Ambulatory Visit: Payer: Self-pay

## 2024-06-02 ENCOUNTER — Telehealth: Payer: Self-pay

## 2024-06-02 MED ORDER — PREDNISONE 50 MG PO TABS: ORAL_TABLET | ORAL | 0 refills | Status: DC

## 2024-06-02 NOTE — Addendum Note (Signed)
 Addended by: Anwar Sakata A on: 06/02/2024 05:04 PM   Modules accepted: Orders

## 2024-06-02 NOTE — Telephone Encounter (Signed)
 Copied from CRM #[id number]. Topic: Referral - Question >> Jun 02, 2024  8:14 AM Mesmerise C wrote: Reason for CRM: Armida from [facility] request orders to be emailed to them at [email or web address] for physical therapy so patient can be scheduled

## 2024-06-03 ENCOUNTER — Encounter: Payer: Self-pay | Admitting: Cardiovascular Disease

## 2024-06-03 NOTE — Telephone Encounter (Signed)
 Are we able to do this?

## 2024-06-03 NOTE — Telephone Encounter (Signed)
I do not know

## 2024-06-03 NOTE — Telephone Encounter (Signed)
 This cannot be sent via email due to it not being a secure way to send patient information.

## 2024-06-04 ENCOUNTER — Telehealth: Payer: Self-pay

## 2024-06-04 DIAGNOSIS — M1611 Unilateral primary osteoarthritis, right hip: Secondary | ICD-10-CM | POA: Diagnosis not present

## 2024-06-04 DIAGNOSIS — Z9181 History of falling: Secondary | ICD-10-CM | POA: Diagnosis not present

## 2024-06-04 NOTE — Telephone Encounter (Signed)
 The heart rates associated with the BP readings are much slower and in normal range (67-89), compared to the ones she complained about 03-110), over the same day period. Are the faster heart rates obtained with a different device? A bP cuff can underestimate the true heart rate. If the faster rates are obtained by auscultation or with an ECG device (Kardia, smart watch, etc.), then the faster rates are the true ones. I would recommend increasing the diltiazem  to 240 mg daily.  But please clarify the discrepancy.

## 2024-06-04 NOTE — Telephone Encounter (Signed)
 I know we can email but do you think we would be able to this and I think it would be easier if they send us  over a form possibly and the provider can sign or do a verbal order for PT?

## 2024-06-04 NOTE — Telephone Encounter (Signed)
 Left message to call back.

## 2024-06-05 ENCOUNTER — Other Ambulatory Visit: Payer: Self-pay | Admitting: Family

## 2024-06-05 NOTE — Telephone Encounter (Signed)
 Called patient to review pre-watchman instructions.   Confirmed procedure date of 06/11/2024. Confirmed arrival time of 0530 for procedure time at 0730. Reviewed pre-procedure instructions with patient. Contrast allergy? Yes. She picked up prednisone . She has an allergy to benadryl  with a reaction of palpitations. This was many years ago. She feels comfortable getting this pre-procedure while monitored.   PPM or defibrillator? No

## 2024-06-05 NOTE — Telephone Encounter (Addendum)
 Left message to call back.  Will confirm Benadryl  allergy details as the patient is to take that prior to procedure for contrast allergy.

## 2024-06-08 ENCOUNTER — Ambulatory Visit (INDEPENDENT_AMBULATORY_CARE_PROVIDER_SITE_OTHER): Admitting: Internal Medicine

## 2024-06-08 VITALS — BP 160/70 | HR 78 | Temp 97.6°F | Ht 62.0 in | Wt 169.0 lb

## 2024-06-08 DIAGNOSIS — E1169 Type 2 diabetes mellitus with other specified complication: Secondary | ICD-10-CM

## 2024-06-08 DIAGNOSIS — J449 Chronic obstructive pulmonary disease, unspecified: Secondary | ICD-10-CM

## 2024-06-08 DIAGNOSIS — D500 Iron deficiency anemia secondary to blood loss (chronic): Secondary | ICD-10-CM

## 2024-06-08 DIAGNOSIS — Z0000 Encounter for general adult medical examination without abnormal findings: Secondary | ICD-10-CM

## 2024-06-08 DIAGNOSIS — I4821 Permanent atrial fibrillation: Secondary | ICD-10-CM

## 2024-06-08 DIAGNOSIS — I5032 Chronic diastolic (congestive) heart failure: Secondary | ICD-10-CM | POA: Diagnosis not present

## 2024-06-08 DIAGNOSIS — F1120 Opioid dependence, uncomplicated: Secondary | ICD-10-CM | POA: Diagnosis not present

## 2024-06-08 DIAGNOSIS — E1142 Type 2 diabetes mellitus with diabetic polyneuropathy: Secondary | ICD-10-CM

## 2024-06-08 DIAGNOSIS — F3342 Major depressive disorder, recurrent, in full remission: Secondary | ICD-10-CM | POA: Diagnosis not present

## 2024-06-08 DIAGNOSIS — I10 Essential (primary) hypertension: Secondary | ICD-10-CM

## 2024-06-08 DIAGNOSIS — E785 Hyperlipidemia, unspecified: Secondary | ICD-10-CM | POA: Diagnosis not present

## 2024-06-08 DIAGNOSIS — I4819 Other persistent atrial fibrillation: Secondary | ICD-10-CM | POA: Diagnosis not present

## 2024-06-08 LAB — CBC
Hematocrit: 33.1 % — ABNORMAL LOW (ref 34.0–46.6)
Hemoglobin: 10.4 g/dL — ABNORMAL LOW (ref 11.1–15.9)
MCH: 29.1 pg (ref 26.6–33.0)
MCHC: 31.4 g/dL — ABNORMAL LOW (ref 31.5–35.7)
MCV: 93 fL (ref 79–97)
Platelets: 181 x10E3/uL (ref 150–450)
RBC: 3.58 x10E6/uL — ABNORMAL LOW (ref 3.77–5.28)
RDW: 16.6 % — ABNORMAL HIGH (ref 11.7–15.4)
WBC: 5.8 x10E3/uL (ref 3.4–10.8)

## 2024-06-08 MED ORDER — HYDROCODONE-ACETAMINOPHEN 5-325 MG PO TABS: 1.0000 | ORAL_TABLET | Freq: Every day | ORAL | 0 refills | Status: DC | PRN

## 2024-06-08 MED ORDER — LORAZEPAM 0.5 MG PO TABS: 0.5000 mg | ORAL_TABLET | Freq: Two times a day (BID) | ORAL | 5 refills | Status: AC

## 2024-06-08 NOTE — Progress Notes (Unsigned)
   Subjective:   Patient ID: [name] Factor, female    DOB: May 28, 1951, 73 y.o.   MRN: [id number]  The patient is here for physical. Pertinent topics discussed: Discussed the use of AI scribe software for clinical note transcription with the patient, who gave verbal consent to proceed.  History of Present Illness [name] is a 73 year old female with atrial fibrillation who presents for follow-up after recent hospitalization for bradycardia.  She was hospitalized for four nights due to bradycardia with a heart rate of 37 bpm. Initially evaluated at an urgent care, she was transferred to the hospital by ambulance. During her stay, she was monitored with multiple electrodes and prepared for potential pacing intervention.  She has a history of anemia and recently received two iron  infusions. Her hemoglobin level was 8.8 g/dL as of October 13th, similar to pre-infusion levels. Historically, her hemoglobin levels have increased significantly within a month or two after infusions.  She experiences sneezing and coughing, which she attributes to colder weather. She received a flu shot during her recent hospital stay. No new gastrointestinal symptoms such as diarrhea or constipation, and no blood in stool. Occasional abdominal pain is noted, with no changes from her usual symptoms.  No new numbness, burning, or tingling in her feet. She had a past fracture of the fifth metatarsal about four to five weeks ago, which has since improved. She uses compression socks to manage her symptoms and finds them beneficial for her arthritic feet.  PMH, [facility], social history reviewed and updated  Review of Systems  Objective:  Physical Exam  Vitals:   06/08/24 1539  Pulse: 78  Temp: 97.6 F (36.4 C)  TempSrc: Oral  SpO2: 97%  Weight: 169 lb (76.7 kg)  Height: 5' 2 (1.575 m)    Assessment & Plan:

## 2024-06-08 NOTE — Patient Instructions (Addendum)
 We can give you the pneumonia shot when you come next time.

## 2024-06-09 DIAGNOSIS — M1611 Unilateral primary osteoarthritis, right hip: Secondary | ICD-10-CM | POA: Diagnosis not present

## 2024-06-09 DIAGNOSIS — Z9181 History of falling: Secondary | ICD-10-CM | POA: Diagnosis not present

## 2024-06-09 LAB — BASIC METABOLIC PANEL WITH GFR
BUN/Creatinine Ratio: 24 (ref 12–28)
BUN: 31 mg/dL — ABNORMAL HIGH (ref 8–27)
CO2: 24 mmol/L (ref 20–29)
Calcium: 9.6 mg/dL (ref 8.7–10.3)
Chloride: 88 mmol/L — ABNORMAL LOW (ref 96–106)
Creatinine, Ser: 1.29 mg/dL — ABNORMAL HIGH (ref 0.57–1.00)
Glucose: 171 mg/dL — ABNORMAL HIGH (ref 70–99)
Potassium: 4.4 mmol/L (ref 3.5–5.2)
Sodium: 129 mmol/L — ABNORMAL LOW (ref 134–144)
eGFR: 43 mL/min/1.73 — ABNORMAL LOW (ref >59)

## 2024-06-09 NOTE — Telephone Encounter (Signed)
 The patient expressed concern for Benadryl  allergy and potential allergy to metals. She has hardware in her hip and has no issues. Her skin is sensitive with cheap jewelry. She has not taken Benadryl  in years but remembers getting palpitations and restless legs. She takes Requip  for RLS. Confirmed she will take Requip  tonight and tomorrow night. Reiterated to her she will be monitored while at the hospital.  The patient was offered allergy testing but she declined.  She wishes to proceed as planned.  Discussed with Dr. Kennyth and he agreed OK to proceed as scheduled. He will discuss with her again DOS and if she has any hesitation or concern, she will be postponed.

## 2024-06-10 DIAGNOSIS — I4819 Other persistent atrial fibrillation: Secondary | ICD-10-CM | POA: Diagnosis not present

## 2024-06-10 DIAGNOSIS — I4821 Permanent atrial fibrillation: Secondary | ICD-10-CM | POA: Diagnosis not present

## 2024-06-10 DIAGNOSIS — Z006 Encounter for examination for normal comparison and control in clinical research program: Secondary | ICD-10-CM | POA: Diagnosis not present

## 2024-06-10 NOTE — Addendum Note (Signed)
 Addended by: Monte Zinni A on: 06/10/2024 05:06 PM   Modules accepted: Orders

## 2024-06-11 ENCOUNTER — Encounter (HOSPITAL_COMMUNITY): Payer: Self-pay | Admitting: Cardiology

## 2024-06-11 ENCOUNTER — Encounter (HOSPITAL_COMMUNITY)
Admission: RE | Disposition: A | Discharge status: Home / Self Care | Payer: Self-pay | Source: Home / Self Care | Attending: Cardiology

## 2024-06-11 ENCOUNTER — Inpatient Hospital Stay (HOSPITAL_COMMUNITY)

## 2024-06-11 ENCOUNTER — Encounter: Payer: Self-pay | Admitting: Internal Medicine

## 2024-06-11 ENCOUNTER — Other Ambulatory Visit: Payer: Self-pay

## 2024-06-11 ENCOUNTER — Inpatient Hospital Stay (HOSPITAL_COMMUNITY)
Admission: RE | Admit: 2024-06-11 | Discharge: 2024-06-13 | DRG: 273 | Disposition: A | Discharge status: Home / Self Care | Attending: Cardiology | Admitting: Cardiology

## 2024-06-11 ENCOUNTER — Ambulatory Visit: Payer: Self-pay | Admitting: Physician Assistant

## 2024-06-11 DIAGNOSIS — N189 Chronic kidney disease, unspecified: Secondary | ICD-10-CM | POA: Diagnosis present

## 2024-06-11 DIAGNOSIS — Z7901 Long term (current) use of anticoagulants: Secondary | ICD-10-CM | POA: Diagnosis not present

## 2024-06-11 DIAGNOSIS — I2510 Atherosclerotic heart disease of native coronary artery without angina pectoris: Secondary | ICD-10-CM

## 2024-06-11 DIAGNOSIS — I330 Acute and subacute infective endocarditis: Secondary | ICD-10-CM | POA: Diagnosis present

## 2024-06-11 DIAGNOSIS — I10 Essential (primary) hypertension: Secondary | ICD-10-CM

## 2024-06-11 DIAGNOSIS — I4821 Permanent atrial fibrillation: Secondary | ICD-10-CM | POA: Diagnosis not present

## 2024-06-11 DIAGNOSIS — K922 Gastrointestinal hemorrhage, unspecified: Secondary | ICD-10-CM

## 2024-06-11 DIAGNOSIS — I4819 Other persistent atrial fibrillation: Secondary | ICD-10-CM

## 2024-06-11 DIAGNOSIS — K760 Fatty (change of) liver, not elsewhere classified: Secondary | ICD-10-CM | POA: Diagnosis present

## 2024-06-11 DIAGNOSIS — R296 Repeated falls: Secondary | ICD-10-CM | POA: Diagnosis present

## 2024-06-11 DIAGNOSIS — I082 Rheumatic disorders of both aortic and tricuspid valves: Secondary | ICD-10-CM | POA: Diagnosis present

## 2024-06-11 DIAGNOSIS — D649 Anemia, unspecified: Secondary | ICD-10-CM | POA: Diagnosis present

## 2024-06-11 DIAGNOSIS — Z83719 Family history of colon polyps, unspecified: Secondary | ICD-10-CM

## 2024-06-11 DIAGNOSIS — Z87891 Personal history of nicotine dependence: Secondary | ICD-10-CM

## 2024-06-11 DIAGNOSIS — Z825 Family history of asthma and other chronic lower respiratory diseases: Secondary | ICD-10-CM

## 2024-06-11 DIAGNOSIS — D6869 Other thrombophilia: Secondary | ICD-10-CM | POA: Diagnosis present

## 2024-06-11 DIAGNOSIS — Z8249 Family history of ischemic heart disease and other diseases of the circulatory system: Secondary | ICD-10-CM

## 2024-06-11 DIAGNOSIS — K7460 Unspecified cirrhosis of liver: Secondary | ICD-10-CM | POA: Diagnosis present

## 2024-06-11 DIAGNOSIS — I729 Aneurysm of unspecified site: Secondary | ICD-10-CM | POA: Diagnosis not present

## 2024-06-11 DIAGNOSIS — I4891 Unspecified atrial fibrillation: Principal | ICD-10-CM | POA: Diagnosis present

## 2024-06-11 DIAGNOSIS — Z96642 Presence of left artificial hip joint: Secondary | ICD-10-CM | POA: Diagnosis present

## 2024-06-11 DIAGNOSIS — F32A Depression, unspecified: Secondary | ICD-10-CM | POA: Diagnosis present

## 2024-06-11 DIAGNOSIS — K219 Gastro-esophageal reflux disease without esophagitis: Secondary | ICD-10-CM | POA: Diagnosis present

## 2024-06-11 DIAGNOSIS — I724 Aneurysm of artery of lower extremity: Secondary | ICD-10-CM | POA: Diagnosis not present

## 2024-06-11 DIAGNOSIS — Z91041 Radiographic dye allergy status: Secondary | ICD-10-CM

## 2024-06-11 DIAGNOSIS — Z8719 Personal history of other diseases of the digestive system: Secondary | ICD-10-CM

## 2024-06-11 DIAGNOSIS — J4489 Other specified chronic obstructive pulmonary disease: Secondary | ICD-10-CM | POA: Diagnosis present

## 2024-06-11 DIAGNOSIS — I130 Hypertensive heart and chronic kidney disease with heart failure and stage 1 through stage 4 chronic kidney disease, or unspecified chronic kidney disease: Secondary | ICD-10-CM | POA: Diagnosis present

## 2024-06-11 DIAGNOSIS — T81718A Complication of other artery following a procedure, not elsewhere classified, initial encounter: Secondary | ICD-10-CM | POA: Diagnosis not present

## 2024-06-11 DIAGNOSIS — R58 Hemorrhage, not elsewhere classified: Secondary | ICD-10-CM | POA: Diagnosis not present

## 2024-06-11 DIAGNOSIS — R1031 Right lower quadrant pain: Secondary | ICD-10-CM | POA: Diagnosis present

## 2024-06-11 DIAGNOSIS — Z9861 Coronary angioplasty status: Secondary | ICD-10-CM

## 2024-06-11 DIAGNOSIS — E1122 Type 2 diabetes mellitus with diabetic chronic kidney disease: Secondary | ICD-10-CM | POA: Diagnosis present

## 2024-06-11 DIAGNOSIS — I728 Aneurysm of other specified arteries: Secondary | ICD-10-CM | POA: Diagnosis present

## 2024-06-11 DIAGNOSIS — Z006 Encounter for examination for normal comparison and control in clinical research program: Secondary | ICD-10-CM

## 2024-06-11 DIAGNOSIS — Z83438 Family history of other disorder of lipoprotein metabolism and other lipidemia: Secondary | ICD-10-CM

## 2024-06-11 DIAGNOSIS — G2581 Restless legs syndrome: Secondary | ICD-10-CM | POA: Diagnosis present

## 2024-06-11 DIAGNOSIS — Z79899 Other long term (current) drug therapy: Secondary | ICD-10-CM

## 2024-06-11 DIAGNOSIS — Z7984 Long term (current) use of oral hypoglycemic drugs: Secondary | ICD-10-CM

## 2024-06-11 DIAGNOSIS — Z800 Family history of malignant neoplasm of digestive organs: Secondary | ICD-10-CM

## 2024-06-11 DIAGNOSIS — I5032 Chronic diastolic (congestive) heart failure: Secondary | ICD-10-CM | POA: Diagnosis present

## 2024-06-11 DIAGNOSIS — E785 Hyperlipidemia, unspecified: Secondary | ICD-10-CM | POA: Diagnosis present

## 2024-06-11 DIAGNOSIS — Z95818 Presence of other cardiac implants and grafts: Secondary | ICD-10-CM | POA: Insufficient documentation

## 2024-06-11 DIAGNOSIS — Z90710 Acquired absence of both cervix and uterus: Secondary | ICD-10-CM

## 2024-06-11 DIAGNOSIS — D500 Iron deficiency anemia secondary to blood loss (chronic): Secondary | ICD-10-CM

## 2024-06-11 DIAGNOSIS — Z808 Family history of malignant neoplasm of other organs or systems: Secondary | ICD-10-CM

## 2024-06-11 DIAGNOSIS — Z888 Allergy status to other drugs, medicaments and biological substances status: Secondary | ICD-10-CM

## 2024-06-11 DIAGNOSIS — F419 Anxiety disorder, unspecified: Secondary | ICD-10-CM | POA: Diagnosis present

## 2024-06-11 HISTORY — PX: TRANSESOPHAGEAL ECHOCARDIOGRAM (CATH LAB): EP1270

## 2024-06-11 HISTORY — PX: LEFT ATRIAL APPENDAGE OCCLUSION: EP1229

## 2024-06-11 LAB — CBC
HCT: 29.7 % — ABNORMAL LOW (ref 36.0–46.0)
Hemoglobin: 10.5 g/dL — ABNORMAL LOW (ref 12.0–15.0)
MCH: 29.7 pg (ref 26.0–34.0)
MCHC: 35.4 g/dL (ref 30.0–36.0)
MCV: 83.9 fL (ref 80.0–100.0)
Platelets: 168 K/uL (ref 150–400)
RBC: 3.54 MIL/uL — ABNORMAL LOW (ref 3.87–5.11)
RDW: 18.1 % — ABNORMAL HIGH (ref 11.5–15.5)
WBC: 7.7 K/uL (ref 4.0–10.5)
nRBC: 0 % (ref 0.0–0.2)

## 2024-06-11 LAB — GLUCOSE, CAPILLARY
Glucose-Capillary: 229 mg/dL — ABNORMAL HIGH (ref 70–99)
Glucose-Capillary: 257 mg/dL — ABNORMAL HIGH (ref 70–99)
Glucose-Capillary: 270 mg/dL — ABNORMAL HIGH (ref 70–99)

## 2024-06-11 LAB — POCT ACTIVATED CLOTTING TIME: Activated Clotting Time: 377 s

## 2024-06-11 LAB — SURGICAL PCR SCREEN
MRSA, PCR: NEGATIVE
Staphylococcus aureus: NEGATIVE

## 2024-06-11 LAB — ECHO TEE

## 2024-06-11 MED ORDER — CHLORHEXIDINE GLUCONATE 4 % EX SOLN: Freq: Once | CUTANEOUS | Status: DC

## 2024-06-11 MED ORDER — DEXMEDETOMIDINE HCL IN NACL 80 MCG/20ML IV SOLN
INTRAVENOUS | Status: DC | PRN
  Administered 2024-06-11: 16 ug via INTRAVENOUS

## 2024-06-11 MED ORDER — APIXABAN 5 MG PO TABS
5.0000 mg | ORAL_TABLET | Freq: Once | ORAL | Status: AC
  Administered 2024-06-11: 5 mg via ORAL
  Filled 2024-06-11: qty 1

## 2024-06-11 MED ORDER — APIXABAN 5 MG PO TABS
5.0000 mg | ORAL_TABLET | Freq: Two times a day (BID) | ORAL | Status: DC
Start: 2024-06-12 — End: 2024-06-13
  Administered 2024-06-12 (×2): 5 mg via ORAL
  Filled 2024-06-11 (×2): qty 1

## 2024-06-11 MED ORDER — SODIUM CHLORIDE 0.9 % IV SOLN: 250.0000 mL | INTRAVENOUS | Status: AC | PRN

## 2024-06-11 MED ORDER — OXYCODONE HCL 5 MG PO TABS
5.0000 mg | ORAL_TABLET | Freq: Once | ORAL | Status: AC
  Administered 2024-06-11: 5 mg via ORAL
  Filled 2024-06-11: qty 1

## 2024-06-11 MED ORDER — PROPOFOL 10 MG/ML IV BOLUS
INTRAVENOUS | Status: DC | PRN
  Administered 2024-06-11: 120 mg via INTRAVENOUS

## 2024-06-11 MED ORDER — INSULIN ASPART 100 UNIT/ML IJ SOLN
0.0000 [IU] | INTRAMUSCULAR | Status: DC | PRN
  Administered 2024-06-11: 3 [IU] via SUBCUTANEOUS
  Filled 2024-06-11: qty 1

## 2024-06-11 MED ORDER — PHENYLEPHRINE HCL-NACL 20-0.9 MG/250ML-% IV SOLN
INTRAVENOUS | Status: DC | PRN
Start: 2024-06-11 — End: 2024-06-11
  Administered 2024-06-11: 10 ug/min via INTRAVENOUS

## 2024-06-11 MED ORDER — FENTANYL CITRATE (PF) 100 MCG/2ML IJ SOLN
INTRAMUSCULAR | Status: AC
  Filled 2024-06-11: qty 2

## 2024-06-11 MED ORDER — CHLORHEXIDINE GLUCONATE 0.12 % MT SOLN
OROMUCOSAL | Status: AC
  Administered 2024-06-11: 15 mL
  Filled 2024-06-11: qty 15

## 2024-06-11 MED ORDER — SUGAMMADEX SODIUM 200 MG/2ML IV SOLN
INTRAVENOUS | Status: DC | PRN
  Administered 2024-06-11: 150.6 mg via INTRAVENOUS

## 2024-06-11 MED ORDER — SODIUM CHLORIDE 0.9% FLUSH
3.0000 mL | Freq: Two times a day (BID) | INTRAVENOUS | Status: DC
  Administered 2024-06-11 – 2024-06-13 (×4): 3 mL via INTRAVENOUS

## 2024-06-11 MED ORDER — SODIUM CHLORIDE 0.9 % IV SOLN: INTRAVENOUS | Status: DC

## 2024-06-11 MED ORDER — LORAZEPAM 0.5 MG PO TABS
0.5000 mg | ORAL_TABLET | Freq: Two times a day (BID) | ORAL | Status: DC
  Administered 2024-06-11 – 2024-06-13 (×4): 0.5 mg via ORAL
  Filled 2024-06-11 (×4): qty 1

## 2024-06-11 MED ORDER — CEFAZOLIN SODIUM-DEXTROSE 2-4 GM/100ML-% IV SOLN
2.0000 g | INTRAVENOUS | Status: AC
  Administered 2024-06-11: 2 g via INTRAVENOUS
  Filled 2024-06-11: qty 100

## 2024-06-11 MED ORDER — IOHEXOL 350 MG/ML SOLN
INTRAVENOUS | Status: DC | PRN
  Administered 2024-06-11: 10 mL

## 2024-06-11 MED ORDER — DIPHENHYDRAMINE HCL 25 MG PO CAPS
50.0000 mg | ORAL_CAPSULE | ORAL | Status: AC
  Administered 2024-06-11: 50 mg via ORAL
  Filled 2024-06-11: qty 2

## 2024-06-11 MED ORDER — PROTAMINE SULFATE 10 MG/ML IV SOLN
INTRAVENOUS | Status: DC | PRN
  Administered 2024-06-11: 35 mg via INTRAVENOUS
  Administered 2024-06-11: 10 mg via INTRAVENOUS

## 2024-06-11 MED ORDER — INSULIN ASPART 100 UNIT/ML IJ SOLN
0.0000 [IU] | Freq: Three times a day (TID) | INTRAMUSCULAR | Status: DC
  Administered 2024-06-11: 5 [IU] via SUBCUTANEOUS
  Administered 2024-06-12: 2 [IU] via SUBCUTANEOUS
  Administered 2024-06-12: 1 [IU] via SUBCUTANEOUS
  Administered 2024-06-12 – 2024-06-13 (×2): 2 [IU] via SUBCUTANEOUS
  Administered 2024-06-13: 1 [IU] via SUBCUTANEOUS

## 2024-06-11 MED ORDER — ACETAMINOPHEN 325 MG PO TABS
ORAL_TABLET | ORAL | Status: AC
  Administered 2024-06-11: 650 mg via ORAL
  Filled 2024-06-11: qty 1

## 2024-06-11 MED ORDER — HEPARIN (PORCINE) IN NACL 2000-0.9 UNIT/L-% IV SOLN
INTRAVENOUS | Status: DC | PRN
  Administered 2024-06-11: 1000 mL

## 2024-06-11 MED ORDER — ACETAMINOPHEN 325 MG PO TABS: 650.0000 mg | ORAL_TABLET | ORAL | Status: DC | PRN

## 2024-06-11 MED ORDER — HEPARIN SODIUM (PORCINE) 1000 UNIT/ML IJ SOLN
INTRAMUSCULAR | Status: DC | PRN
  Administered 2024-06-11: 14000 [IU] via INTRAVENOUS

## 2024-06-11 MED ORDER — LISINOPRIL 10 MG PO TABS
10.0000 mg | ORAL_TABLET | Freq: Every day | ORAL | Status: DC
  Administered 2024-06-11 – 2024-06-12 (×2): 10 mg via ORAL
  Filled 2024-06-11 (×2): qty 1

## 2024-06-11 MED ORDER — DILTIAZEM HCL ER COATED BEADS 120 MG PO CP24
120.0000 mg | ORAL_CAPSULE | Freq: Every day | ORAL | Status: DC
  Administered 2024-06-12 – 2024-06-13 (×2): 120 mg via ORAL
  Filled 2024-06-11 (×2): qty 1

## 2024-06-11 MED ORDER — LACTATED RINGERS IV SOLN: INTRAVENOUS | Status: DC | PRN

## 2024-06-11 MED ORDER — ONDANSETRON HCL 4 MG/2ML IJ SOLN
INTRAMUSCULAR | Status: DC | PRN
Start: 2024-06-11 — End: 2024-06-11
  Administered 2024-06-11: 4 mg via INTRAVENOUS

## 2024-06-11 MED ORDER — FENTANYL CITRATE (PF) 250 MCG/5ML IJ SOLN
INTRAMUSCULAR | Status: DC | PRN
  Administered 2024-06-11: 50 ug via INTRAVENOUS
  Administered 2024-06-11 (×2): 25 ug via INTRAVENOUS

## 2024-06-11 MED ORDER — ACETAMINOPHEN 10 MG/ML IV SOLN: 1000.0000 mg | Freq: Once | INTRAVENOUS | Status: DC | PRN

## 2024-06-11 MED ORDER — PERFLUTREN LIPID MICROSPHERE
1.0000 mL | INTRAVENOUS | Status: AC | PRN
  Administered 2024-06-11: 1 mL via INTRAVENOUS

## 2024-06-11 MED ORDER — TRAZODONE HCL 100 MG PO TABS
200.0000 mg | ORAL_TABLET | Freq: Every evening | ORAL | Status: DC | PRN
  Administered 2024-06-12: 200 mg via ORAL
  Filled 2024-06-11: qty 2

## 2024-06-11 MED ORDER — METOPROLOL SUCCINATE ER 50 MG PO TB24
50.0000 mg | ORAL_TABLET | Freq: Two times a day (BID) | ORAL | Status: DC
  Administered 2024-06-11 – 2024-06-13 (×4): 50 mg via ORAL
  Filled 2024-06-11 (×4): qty 1

## 2024-06-11 MED ORDER — LIDOCAINE 2% (20 MG/ML) 5 ML SYRINGE
INTRAMUSCULAR | Status: DC | PRN
  Administered 2024-06-11: 100 mg via INTRAVENOUS

## 2024-06-11 MED ORDER — ONDANSETRON HCL 4 MG/2ML IJ SOLN: 4.0000 mg | Freq: Four times a day (QID) | INTRAMUSCULAR | Status: DC | PRN

## 2024-06-11 MED ORDER — PREDNISONE 20 MG PO TABS
50.0000 mg | ORAL_TABLET | ORAL | Status: AC
  Administered 2024-06-11: 50 mg via ORAL
  Filled 2024-06-11: qty 3

## 2024-06-11 MED ORDER — ROCURONIUM BROMIDE 10 MG/ML (PF) SYRINGE
PREFILLED_SYRINGE | INTRAVENOUS | Status: DC | PRN
  Administered 2024-06-11: 80 mg via INTRAVENOUS

## 2024-06-11 MED ORDER — SODIUM CHLORIDE 0.9% FLUSH
3.0000 mL | INTRAVENOUS | Status: DC | PRN
Start: 2024-06-11 — End: 2024-06-13

## 2024-06-11 SURGICAL SUPPLY — 18 items
CATH DIAG 6FR PIGTAIL ANGLED (CATHETERS) IMPLANT
CATH WATCHMAN STEER ACCESS SYS (CATHETERS) IMPLANT
CLOSURE PERCLOSE PROSTYLE (Vascular Products) IMPLANT
DEVICE WATCHMAN FLX PRO PROC (KITS) IMPLANT
DEVICE WATCHMAN TRUSTEER PROC (KITS) IMPLANT
DILATOR VESSEL 38 20CM 16FR (INTRODUCER) IMPLANT
KIT VERSACROSS CON 12FR 85 (KITS) IMPLANT
PACK CARDIAC CATHETERIZATION (CUSTOM PROCEDURE TRAY) ×1 IMPLANT
PAD DEFIB RADIO PHYSIO CONN (PAD) ×1 IMPLANT
SHEATH PERFORMER 18FRX30 (VASCULAR PRODUCTS) IMPLANT
SHEATH PINNACLE 8F 10CM (SHEATH) IMPLANT
SHEATH PROBE COVER 6X72 (BAG) ×1 IMPLANT
SYR CONTROL 10ML ANGIOGRAPHIC (SYRINGE) IMPLANT
TRANSDUCER W/STOPCOCK (MISCELLANEOUS) ×1 IMPLANT
TUBING ART PRESS 72 MALE/FEM (TUBING) IMPLANT
TUBING CIL FLEX 10 FLL-RA (TUBING) ×1 IMPLANT
WATCHMAN FLX PRO 27 (Prosthesis & Implant Heart) IMPLANT
WIRE EMERALD 3MM-J .035X150CM (WIRE) IMPLANT

## 2024-06-11 NOTE — Progress Notes (Signed)
 Pt would like PNA vaccine But would like to check if it is free w/ insurance

## 2024-06-11 NOTE — Assessment & Plan Note (Signed)
 Flu shot up to date. Pneumonia will get at next visit. Shingrix complete. Tetanus up to date. Colonoscopy up to date. Mammogram up to date, pap smear aged out and dexa complete. Counseled about sun safety and mole surveillance. Counseled about the dangers of distracted driving. Given 10 year screening recommendations.

## 2024-06-11 NOTE — Discharge Summary (Incomplete)
 Electrophysiology Discharge Summary   Patient ID: [name],  MRN: [id number], DOB/AGE: November 08, 1950 73 y.o.  Admit date: 06/11/2024 Discharge date: 06/12/2024  Primary Care Physician: Rollene Almarie LABOR, MD  Primary Cardiologist: Jerel Balding, MD  Electrophysiologist: Fonda Kitty, MD     Primary Discharge Diagnosis:  Persistent Atrial Fibrillation Poor candidacy for long term anticoagulation due to h/o GI bleeding, falls  Secondary Discharge Diagnosis:  CAD  Chronic Diastolic CHF  HTN Pericardiocentesis for Hemothorax with Tamponade GIB  Falls   Procedures This Admission:  Transeptal Puncture Intra-procedural TEE which showed no LAA thrombus Left atrial appendage occlusive device placement on 06/11/24 by Dr. Kitty.     This study demonstrated: 1.Successful implantation of a 27mm WATCHMAN left atrial appendage occlusive device. 2. TEE demonstrating no LAA thrombus. 3. No early apparent complications.    Post Implant Anticoagulation Strategy: Continue Eliquis  for the duration of the 6 month medical therapy (Plavix allergy).  Repeat imaging with TEE in 60 days.     Brief HPI: [name] is a 73 y.o. female with a history of Persistent Atrial Fibrillation who was referred to Electrophysiology in the outpatient setting    Hospital Course:  The patient was admitted and underwent left atrial appendage occlusive device placement as above.  The patient was monitored overnight and has done very well with no concerns. Groin site has been stable without evidence of hematoma or bleeding. Wound care and restrictions were reviewed with the patient.   The patient has been scheduled for post procedure follow up with EP APP in approximately 6 weeks. They will restart Eliquis  this evening and continue for the duration to complete 6 months of therapy. They will require dental SBE for 6 month post op and should refrain from dental work or cleanings for the first 45 days  post implant. SBE to be RXd at follow up.   A repeat TEE (due to CKD) will be performed in approximately 60 days to ensure proper seal of the device.    Physical Exam: Vitals:   06/11/24 2121 06/11/24 2314 06/12/24 0313 06/12/24 0756  BP: 126/61 (!) 108/40 (!) 124/52 (!) 128/43  Pulse: 87 63 66 90  Resp: 15 16 15 15   Temp: 97.7 F (36.5 C) 98.5 F (36.9 C) 97.9 F (36.6 C) 98.4 F (36.9 C)  TempSrc: Oral Oral Oral Oral  SpO2: 100% 99% 100% 95%  Weight:      Height:        GEN: Well nourished, well developed in no acute distress NECK: No JVD; No carotid bruits CARDIAC: Irregularly irregular rate and rhythm, no murmurs, rubs, gallops RESPIRATORY:  Clear to auscultation without rales, wheezing or rhonchi  ABDOMEN: Soft, non-tender, non-distended EXTREMITIES:  No edema; No deformity. Groin site Stable     Discharge Medications:  Allergies as of 06/12/2024       Reactions   Benadryl  [diphenhydramine  Hcl] Other (See Comments)   Restless leg   Clopidogrel Bisulfate    Other reaction(s): stomach upset   Ioversol Other (See Comments)   Unknown reaction   Benadryl  [diphenhydramine ] Anxiety   Iodinated Contrast Media Palpitations, Rash   Rapid heart rate, hot Other reaction(s): rapid heartbeat Other reaction(s): flushing        Medication List     TAKE these medications    Accu-Chek Softclix Lancets lancets   acetaminophen  325 MG tablet Commonly known as: TYLENOL  Take 2 tablets (650 mg total) by mouth every 4 (four) hours as needed  for headache or mild pain (pain score 1-3).   albuterol  108 (90 Base) MCG/ACT inhaler Commonly known as: VENTOLIN  HFA Inhale 2 puffs into the lungs every 6 (six) hours as needed for wheezing or shortness of breath.   Anoro Ellipta  62.5-25 MCG/ACT Aepb Generic drug: umeclidinium-vilanterol Inhale 1 puff into the lungs daily. What changed:  when to take this reasons to take this   ASCORBIC ACID  PO Take 1 tablet by mouth at  bedtime.   benzonatate  200 MG capsule Commonly known as: TESSALON  Take 1 capsule (200 mg total) by mouth 3 (three) times daily as needed. What changed: when to take this   CALCIUM  CITRATE PLUS/MAGNESIUM  PO Take 1 tablet by mouth daily.   diltiazem  120 MG 24 hr capsule Commonly known as: CARDIZEM  CD Take 1 capsule (120 mg total) by mouth daily.   Eliquis  5 MG Tabs tablet Generic drug: apixaban  TAKE ONE TABLET BY MOUTH TWICE DAILY   famotidine  20 MG tablet Commonly known as: PEPCID  Take 1 tablet (20 mg total) by mouth 2 (two) times daily. What changed:  when to take this reasons to take this   FREESTYLE LITE test strip Generic drug: glucose blood Use as instructed   gabapentin  100 MG capsule Commonly known as: NEURONTIN  Take 100 mg by mouth 3 (three) times daily as needed.   HYDROcodone -acetaminophen  5-325 MG tablet Commonly known as: NORCO/VICODIN Take 1 tablet by mouth daily as needed for moderate pain (pain score 4-6) (This is a 30 day supply). What changed:  how much to take when to take this   Janumet  50-1000 MG tablet Generic drug: sitaGLIPtin -metformin  Take 1 tablet by mouth 2 (two) times daily with a meal.   latanoprost  0.005 % ophthalmic solution Commonly known as: XALATAN  Place 1 drop into both eyes at bedtime.   lisinopril  10 MG tablet Commonly known as: ZESTRIL  Take 1 tablet (10 mg total) by mouth daily. What changed: when to take this   LORazepam  0.5 MG tablet Commonly known as: ATIVAN  Take 1 tablet (0.5 mg total) by mouth 2 (two) times daily. What changed:  how much to take when to take this   magnesium  oxide 400 MG tablet Commonly known as: MAG-OX Take 1 tablet (400 mg total) by mouth daily. What changed: when to take this   metoprolol  succinate 50 MG 24 hr tablet Commonly known as: TOPROL -XL Take 1 tablet (50 mg total) by mouth 2 (two) times daily. Take with or immediately following a meal.   nystatin  powder Commonly known as:  MYCOSTATIN /NYSTOP  Apply topically once a week. What changed:  how much to take when to take this additional instructions   nystatin  ointment Commonly known as: MYCOSTATIN  Apply 1 Application topically 2 (two) times daily as needed (IRRITATION). What changed: when to take this   PARoxetine  10 MG tablet Commonly known as: PAXIL  Take 2.5 tablets (25 mg total) by mouth daily. What changed: when to take this   predniSONE  50 MG tablet Commonly known as: DELTASONE  Take Prednisone  1 tablet (50 mg) by mouth on 10/29 at 6:30PM. Take the second tablet on 10/30 at 12:30AM.   Prevagen Extra Strength 20 MG Caps Generic drug: Apoaequorin Take 20 mg by mouth daily.   rOPINIRole  0.5 MG tablet Commonly known as: REQUIP  Take 1 tablet (0.5 mg total) by mouth at bedtime. What changed: how much to take   rosuvastatin  10 MG tablet Commonly known as: CRESTOR  Take 1 tablet (10 mg total) by mouth at bedtime.   SALONPAS EX Apply  1 patch topically daily as needed.   senna 8.6 MG Tabs tablet Commonly known as: SENOKOT Take 1 tablet by mouth at bedtime as needed for mild constipation.   torsemide  20 MG tablet Commonly known as: DEMADEX  Take 40 mg BID alternating with 40mg  once daily   traZODone  100 MG tablet Commonly known as: DESYREL  take UP TO 2 TABS (200 MG) BY MOUTH AT bedtime FOR SLEEP What changed:  how much to take how to take this when to take this additional instructions   triamcinolone  cream 0.1 % Commonly known as: KENALOG  Apply 1 Application topically 2 (two) times daily. What changed:  when to take this reasons to take this   VITAMIN D3 PO Take 1 capsule by mouth at bedtime.        Disposition:  Home with usual follow up as in AVS  Duration of Discharge Encounter:  APP Time: 22 minutes  Signed, Ozell Prentice Passey, PA-C

## 2024-06-11 NOTE — Transfer of Care (Signed)
 Immediate Anesthesia Transfer of Care Note  Patient: [name]  Procedure(s) Performed: LEFT ATRIAL APPENDAGE OCCLUSION TRANSESOPHAGEAL ECHOCARDIOGRAM  Patient Location: Cath Lab  Anesthesia Type:General  Level of Consciousness: awake  Airway & Oxygen Therapy: Patient Spontanous Breathing and Patient connected to face mask oxygen  Post-op Assessment: Report given to RN and Post -op Vital signs reviewed and stable  Post vital signs: Reviewed and stable  Last Vitals:  Vitals Value Taken Time  BP 88/44 06/11/24 10:00  Temp 36.1 C 06/11/24 09:33  Pulse 84 06/11/24 10:08  Resp 10 06/11/24 10:08  SpO2 95 % 06/11/24 10:08  Vitals shown include unfiled device data.  Last Pain:  Vitals:   06/11/24 0933  TempSrc: Temporal  PainSc: Asleep         Complications: There were no known notable events for this encounter.

## 2024-06-11 NOTE — Assessment & Plan Note (Signed)
 Getting watchman device later this week and understands what to expect.

## 2024-06-11 NOTE — Assessment & Plan Note (Signed)
 Taking paxil  and stable with decent control. Will continue.

## 2024-06-11 NOTE — Assessment & Plan Note (Addendum)
 No flare today and on diuretics and ACE-I. Continue.

## 2024-06-11 NOTE — Assessment & Plan Note (Signed)
 Taking crestor  and labs up to date. Continue and regular monitoring.

## 2024-06-11 NOTE — Anesthesia Procedure Notes (Signed)
 Procedure Name: Intubation Date/Time: 06/11/2024 7:40 AM  Performed by: Zelphia Norleen HERO, CRNAPre-anesthesia Checklist: Patient identified, Emergency Drugs available, Suction available and Patient being monitored Patient Re-evaluated:Patient Re-evaluated prior to induction Oxygen Delivery Method: Circle system utilized Preoxygenation: Pre-oxygenation with 100% oxygen Induction Type: IV induction Ventilation: Mask ventilation without difficulty Laryngoscope Size: Mac and 3 Grade View: Grade I Tube type: Oral Tube size: 7.0 mm Number of attempts: 1 Airway Equipment and Method: Stylet Placement Confirmation: ETT inserted through vocal cords under direct vision, positive ETCO2 and breath sounds checked- equal and bilateral Secured at: 22 cm Tube secured with: Tape Dental Injury: Teeth and Oropharynx as per pre-operative assessment

## 2024-06-11 NOTE — Assessment & Plan Note (Signed)
 Reviewed [place] PDMP and appropriate. Refill as needed until next visit

## 2024-06-11 NOTE — Assessment & Plan Note (Signed)
 Using anoro daily and albuterol  prn and no flare today. Continue.

## 2024-06-11 NOTE — Assessment & Plan Note (Signed)
 She defers labs today as she has already gotten labs with cardiology earlier today. Due for Hga1c with next labs.

## 2024-06-11 NOTE — Discharge Instructions (Signed)
 WATCHMANT Procedure, Care After  Procedure MD: Dr. Kennyth Olds Clinical Coordinator: Rockie Redman, RN  This sheet gives you information about how to care for yourself after your procedure. Your health care provider may also give you more specific instructions. If you have problems or questions, contact your health care provider.  What can I expect after the procedure? After the procedure, it is common to have: Bruising around your puncture site. Tenderness around your puncture site. Tiredness (fatigue).  Medication instructions It is very important to continue to take your blood thinner as directed by your doctor after the Watchman procedure. Call your procedure doctor's office with question or concerns. If you are on Coumadin (warfarin), you will have your INR checked the week after your procedure, with a goal INR of 2.0 - 3.0. Please follow your medication instructions on your discharge summary. Only take the medications listed on your discharge paperwork.  Follow up You will be seen in 6 weeks after your procedure You will have a repeat CT scan or Echocardiogram approximately 8 weeks after your procedure mark to check your device You will follow up the MD/APP who performed your procedure 6 months after your procedure The Watchman Clinical Coordinator will check in with you from time to time, including 1 and 2 years after your procedure.  NO DENTAL CLEANINGS FOR 45 days. After that, you will require antibiotics for dental procedures the first 6 months.   Follow these instructions at home: Puncture site care  Follow instructions from your health care provider about how to take care of your puncture site. Make sure you: If present, leave stitches (sutures), skin glue, or adhesive strips in place.  If a large square bandage is present, this may be removed 24 hours after surgery.  Check your puncture site every day for signs of infection. Check for: Redness, swelling, or pain. Fluid  or blood. If your puncture site starts to bleed, lie down on your back, apply firm pressure to the area, and contact your health care provider. Warmth. Pus or a bad smell. Driving Do not drive yourself home if you received sedation Do not drive for at least 4 days after your procedure or however long your health care provider recommends. (Do not resume driving if you have previously been instructed not to drive for other health reasons.) Do not spend greater than 1 hour at a time in a car for the first 3 days. Stop and take a break with a 5 minute walk at least every hour.  Do not drive or use heavy machinery while taking prescription pain medicine.  Activity Avoid activities that take a lot of effort, including exercise, for at least 7 days after your procedure. For the first 3 days, avoid sitting for longer than one hour at a time.  Avoid alcoholic beverages, signing paperwork, or participating in legal proceedings for 24 hours after receiving sedation Do not lift anything that is heavier than 10 lb (4.5 kg) for one week.  No sexual activity for 1 week.  Return to your normal activities as told by your health care provider. Ask your health care provider what activities are safe for you. General instructions Take over-the-counter and prescription medicines only as told by your health care provider. Do not use any products that contain nicotine or tobacco, such as cigarettes and e-cigarettes. If you need help quitting, ask your health care provider. You may shower after 24 hours, but Do not take baths, swim, or use a hot tub for  1 week.  Do not drink alcohol for 24 hours after your procedure. Keep all follow-up visits as told by your health care provider. This is important. Dental Work: You will require antibiotics prior to any dental work, including cleanings, for 6 months after your Watchman implantation to help protect you from infection. After 6 months, antibiotics are no longer  required. Contact a health care provider if: You have redness, mild swelling, or pain around your puncture site. You have soreness in your throat or at your puncture site that does not improve after several days You have fluid or blood coming from your puncture site that stops after applying firm pressure to the area. Your puncture site feels warm to the touch. You have pus or a bad smell coming from your puncture site. You have a fever. You have chest pain or discomfort that spreads to your neck, jaw, or arm. You are sweating a lot. You feel nauseous. You have a fast or irregular heartbeat. You have shortness of breath. You are dizzy or light-headed and feel the need to lie down. You have pain or numbness in the arm or leg closest to your puncture site. Get help right away if: Your puncture site suddenly swells. Your puncture site is bleeding and the bleeding does not stop after applying firm pressure to the area. These symptoms may represent a serious problem that is an emergency. Do not wait to see if the symptoms will go away. Get medical help right away. Call your local emergency services (911 in the [place] Do not drive yourself to the hospital. Summary After the procedure, it is normal to have bruising and tenderness at the puncture site in your groin, neck, or forearm. Check your puncture site every day for signs of infection. Get help right away if your puncture site is bleeding and the bleeding does not stop after applying firm pressure to the area. This is a medical emergency.  This information is not intended to replace advice given to you by your health care provider. Make sure you discuss any questions you have with your health care provider.

## 2024-06-11 NOTE — Assessment & Plan Note (Signed)
 Recent iron  infusions and due for labs in 1-2 months with CBC and ferritin. We are hopeful with watchman she will be able to stop anticoagulation and may not have to have as frequent iron  infusions.

## 2024-06-11 NOTE — H&P (Addendum)
 Electrophysiology Note:   Date:  06/11/24  ID:  [name], DOB [birth year], MRN [id number]   Primary Cardiologist: Jerel Balding, MD Electrophysiologist: Fonda Kitty, MD       History of Present Illness:   [name] is a 73 y.o. female with h/o CAD (posterior lateral ventricular branch stent in 2009), chronic diastolic heart failure, longstanding persistent atrial fibrillation, diabetes mellitus type 2, hypertension, pericardiocentesis for hemothorax with tamponade in June 2020, h/o GI bleeding, falls who is being seen today for evaluation for Watchman device implant at the request of Dr. Balding.   Discussed the use of AI scribe software for clinical note transcription with the patient, who gave verbal consent to proceed.   History of Present Illness [name] is a 73 year old female with anemia and bleeding issues who presents for evaluation of candidacy for the Watchman device due to dye allergy. She was referred by Dr. Balding for evaluation of the Watchman device due to her allergy to contrast dye.   She has a history of anemia and bleeding issues, including bleeding around the heart, in the lungs, and gastrointestinal bleeding. These conditions have contributed to falls, potentially related to her anemia.   She is allergic to the dye typically used in diagnostic procedures, necessitating the consideration of alternative imaging methods to assess her candidacy for the Watchman device.   Her brother has previously received a Watchman device.   She wears compression stockings daily, which she feels are effective in managing fluid retention.   Reports feeling relatively well.  No new or acute complaints today.    Interval: Patient presents today for planned Watchman implant. Reports feeling relatively well. No new or acute complaints. Agreeable to contrast if needed.  Review of systems complete and found to be negative unless listed in HPI.    EP Information /  Studies Reviewed:     EKG is not ordered today. EKG from 12/15/23 reviewed which showed AF       Echo 05/2022:   1. Left ventricular ejection fraction, by estimation, is 60 to 65%. The  left ventricle has normal function. The left ventricle has no regional  wall motion abnormalities. Left ventricular diastolic function could not  be evaluated.   2. Right ventricular systolic function is mildly reduced. The right  ventricular size is normal. Tricuspid regurgitation signal is inadequate  for assessing PA pressure.   3. Left atrial size was mildly dilated.   4. The mitral valve is normal in structure. Trivial mitral valve  regurgitation. No evidence of mitral stenosis.   5. The aortic valve is normal in structure. Aortic valve regurgitation is  mild. No aortic stenosis is present.   6. The inferior vena cava is dilated in size with >50% respiratory  variability, suggesting right atrial pressure of 8 mmHg.    Nuclear Stress 05/2021:   Findings are consistent with no prior ischemia and no prior myocardial infarction. The study is low risk.   No ST deviation was noted.   LV perfusion is normal. There is no evidence of ischemia. There is no evidence of infarction.   End diastolic cavity size is normal. End systolic cavity size is normal.   Normal perfusion EF may not be accurate due to afib estimated at 51% no RWMAls noted      Risk Assessment/Calculations:     CHA2DS2-VASc Score = 7   This indicates a 11.2% annual risk of stroke. The patient's score is based upon:  CHF History: 1 HTN History: 1 Diabetes History: 1 Stroke History: 0 Vascular Disease History: 1 Age Score: 2 Gender Score: 1               Physical Exam:    Today's Vitals   06/11/24 0620 06/11/24 0641  BP: (!) 140/75   Pulse: 92   Resp: 18   Temp: 98.4 F (36.9 C)   TempSrc: Oral   SpO2: 94%   Weight: 75.3 kg   Height: 5' 2 (1.575 m)   PainSc:  0-No pain   Body mass index is 30.36 kg/m.  GEN: Well  nourished, well developed in no acute distress NECK: No JVD CARDIAC: Normal rate, irregular RESPIRATORY:  Clear to auscultation without rales, wheezing or rhonchi  ABDOMEN: Soft, non-distended EXTREMITIES:  No edema; No deformity    ASSESSMENT AND PLAN:   I have seen [name] in the office today who is being considered for a Watchman left atrial appendage closure device. I believe they will benefit from this procedure given their history of atrial fibrillation, CHA2DS2-VASc score of 7 and unadjusted ischemic stroke rate of 11.2% per year. Unfortunately, the patient is not felt to be a long term anticoagulation candidate secondary to chronic anemia, h/o GI bleeding, falls, h/o hemothorax. The patient's chart has been reviewed and I feel that they would be a candidate for short term oral anticoagulation after Watchman implant.    It is my belief that after undergoing a LAA closure procedure, [name] will not need long term anticoagulation which eliminates anticoagulation side effects and major bleeding risk.    Procedural risks for the Watchman implant have been reviewed with the patient including a 0.5% risk of stroke, <1% risk of perforation and <1% risk of device embolization. Other risks include bleeding, vascular damage, tamponade, worsening renal function, and death. The patient understands these risk and wishes to proceed.       The published clinical data on the safety and effectiveness of WATCHMAN include but are not limited to the following: - Holmes DR, Jess BEARD, Sick P et al. for the PROTECT AF Investigators. Percutaneous closure of the left atrial appendage versus warfarin therapy for prevention of stroke in patients with atrial fibrillation: a randomised non-inferiority trial. Lancet 2009; 374: 534-42. GLENWOOD Jess BEARD, Doshi SK, Jonita VEAR Satchel D et al. on behalf of the PROTECT AF Investigators. Percutaneous Left Atrial Appendage Closure for Stroke Prophylaxis in Patients With  Atrial Fibrillation 2.3-Year Follow-up of the PROTECT AF (Watchman Left Atrial Appendage System for Embolic Protection in Patients With Atrial Fibrillation) Trial. Circulation 2013; 127:720-729. - Alli O, Doshi S,  Kar S, Reddy VY, Sievert H et al. Quality of Life Assessment in the Randomized PROTECT AF (Percutaneous Closure of the Left Atrial Appendage Versus Warfarin Therapy for Prevention of Stroke in Patients With Atrial Fibrillation) Trial of Patients at Risk for Stroke With Nonvalvular Atrial Fibrillation. J Am Coll Cardiol 2013; 61:1790-8. GLENWOOD Satchel DR, Archer RAMAN, Price M, Whisenant B, Sievert H, Doshi S, Huber K, Reddy V. Prospective randomized evaluation of the Watchman left atrial appendage Device in patients with atrial fibrillation versus long-term warfarin therapy; the PREVAIL trial. Journal of the [employer] of Cardiology, Vol. 4, No. 1, 2014, 1-11. - Kar S, Doshi SK, Sadhu A, Horton R, Osorio J et al. Primary outcome evaluation of a next-generation left atrial appendage closure device: results from the PINNACLE FLX trial. Circulation 2021;143(18)1754-1762.      HAS-BLED score 3 Hypertension  Yes  Abnormal renal and liver function (Dialysis, transplant, Cr >2.26 mg/dL /Cirrhosis or Bilirubin >2x Normal or AST/ALT/AP >3x Normal) No  Stroke No  Bleeding Yes  Labile INR (Unstable/high INR) No  Elderly (>65) Yes  Drugs or alcohol (>= 8 drinks/week, anti-plt or NSAID) No    CHA2DS2-VASc Score = 7  The patient's score is based upon: CHF History: 1 - NYHA class II HTN History: 1 Diabetes History: 1 Stroke History: 0 Vascular Disease History: 1 Age Score: 2 Gender Score: 1         ASSESSMENT AND PLAN:  Permanent Atrial Fibrillation: No cardiac awareness of her arrhythmia. The patient's CHA2DS2-VASc score is 7, indicating a 11.2% annual risk of stroke.     Secondary Hypercoagulable State  Chronic anemia History of GI bleeding Recurrent falls The patient is at significant risk  for stroke/thromboembolism based upon her CHA2DS2-VASc Score of 7.  Continue Apixaban  (Eliquis ). She underwent TEE with Dr. Francyne demonstrating anatomy that should be amenable to closure with Watchman device. She presents today for procedure. Risks and benefits readdressed as above and patient has elected to proceed. She is agreeable to contrast if needed. She has been pre-treated with steroids.      Follow up with Dr. Kennyth 3 months after [facility] implant.    Signed, Fonda Kennyth, MD

## 2024-06-11 NOTE — Assessment & Plan Note (Signed)
 Stable and foot exam done. Using gabapentin .

## 2024-06-11 NOTE — Anesthesia Preprocedure Evaluation (Addendum)
 Anesthesia Evaluation  Patient identified by MRN, date of birth, ID band Patient awake    Reviewed: Allergy & Precautions, NPO status , Patient's Chart, lab work & pertinent test results  History of Anesthesia Complications Negative for: history of anesthetic complications  Airway Mallampati: II  TM Distance: >3 FB Neck ROM: Full    Dental  (+) Teeth Intact, Dental Advisory Given   Pulmonary COPD, neg recent URI, former smoker   breath sounds clear to auscultation       Cardiovascular hypertension, (-) angina + CAD, + Cardiac Stents and +CHF  + dysrhythmias Atrial Fibrillation  Rhythm:Irregular Rate:Normal  TEE (05/01/24)  1. Left ventricular ejection fraction, by estimation, is 55 to 60%. The  left ventricle has normal function. The left ventricle has no regional  wall motion abnormalities. Left ventricular diastolic function could not  be evaluated.   2. Right ventricular systolic function is moderately reduced. The right  ventricular size is moderately enlarged. Tricuspid regurgitation signal is  inadequate for assessing PA pressure.   3. Left atrial size was severely dilated. No left atrial/left atrial  appendage thrombus was detected.   4. Right atrial size was severely dilated.   5. The mitral valve is normal in structure. Mild mitral valve  regurgitation.   6. The aortic valve is tricuspid. Aortic valve regurgitation is mild. No  aortic stenosis is present.   7. There is Moderate (Grade III) protruding plaque involving the  descending aorta.   8. The thin secundum part of the atrial septum is extensive, allowing  broad options for transseptal puncture.   9. 3D performed of the LAA and demonstrates anatomy amenable to Watchman  device deployment (probably 27 mm device).     Neuro/Psych  PSYCHIATRIC DISORDERS Anxiety Depression     Neuromuscular disease (Diabetic Neuropathy)    GI/Hepatic ,GERD  Medicated,,   Endo/Other  diabetes, Type 2    Renal/GU Renal InsufficiencyRenal disease     Musculoskeletal  (+) Arthritis ,    Abdominal   Peds  Hematology  (+) Blood dyscrasia, anemia Hx of IDA s/p Iron  Infusions   Anesthesia Other Findings   Reproductive/Obstetrics                              Anesthesia Physical Anesthesia Plan  ASA: 3  Anesthesia Plan: General   Post-op Pain Management:    Induction: Intravenous  PONV Risk Score and Plan: 2  Airway Management Planned: Oral ETT  Additional Equipment:   Intra-op Plan:   Post-operative Plan: Extubation in OR  Informed Consent:      Dental advisory given  Plan Discussed with: CRNA and Surgeon  Anesthesia Plan Comments:          Anesthesia Quick Evaluation

## 2024-06-11 NOTE — Assessment & Plan Note (Signed)
 BP elevated today but typically at goal. Will monitor closely.

## 2024-06-11 NOTE — Anesthesia Postprocedure Evaluation (Signed)
 Anesthesia Post Note  Patient: [name]  Procedure(s) Performed: LEFT ATRIAL APPENDAGE OCCLUSION TRANSESOPHAGEAL ECHOCARDIOGRAM     Patient location during evaluation: Cath Lab Anesthesia Type: General Level of consciousness: awake Pain management: pain level controlled Vital Signs Assessment: post-procedure vital signs reviewed and stable Respiratory status: spontaneous breathing Cardiovascular status: blood pressure returned to baseline Postop Assessment: no apparent nausea or vomiting Anesthetic complications: no   There were no known notable events for this encounter.            Lauraine KATHEE [place]

## 2024-06-12 ENCOUNTER — Encounter (HOSPITAL_COMMUNITY): Payer: Self-pay | Admitting: Cardiology

## 2024-06-12 ENCOUNTER — Telehealth: Payer: Self-pay

## 2024-06-12 ENCOUNTER — Inpatient Hospital Stay (HOSPITAL_COMMUNITY)

## 2024-06-12 DIAGNOSIS — Z95818 Presence of other cardiac implants and grafts: Secondary | ICD-10-CM | POA: Diagnosis not present

## 2024-06-12 DIAGNOSIS — I4821 Permanent atrial fibrillation: Secondary | ICD-10-CM | POA: Diagnosis not present

## 2024-06-12 DIAGNOSIS — T81718A Complication of other artery following a procedure, not elsewhere classified, initial encounter: Secondary | ICD-10-CM | POA: Diagnosis not present

## 2024-06-12 DIAGNOSIS — I729 Aneurysm of unspecified site: Secondary | ICD-10-CM

## 2024-06-12 DIAGNOSIS — I724 Aneurysm of artery of lower extremity: Secondary | ICD-10-CM | POA: Diagnosis not present

## 2024-06-12 LAB — HEMOGLOBIN A1C
Hgb A1c MFr Bld: 5.7 % — ABNORMAL HIGH (ref 4.8–5.6)
Mean Plasma Glucose: 116.89 mg/dL

## 2024-06-12 LAB — GLUCOSE, CAPILLARY
Glucose-Capillary: 195 mg/dL — ABNORMAL HIGH (ref 70–99)
Glucose-Capillary: 145 mg/dL — ABNORMAL HIGH (ref 70–99)
Glucose-Capillary: 196 mg/dL — ABNORMAL HIGH (ref 70–99)
Glucose-Capillary: 183 mg/dL — ABNORMAL HIGH (ref 70–99)

## 2024-06-12 MED ORDER — ROPINIROLE HCL 0.25 MG PO TABS
0.2500 mg | ORAL_TABLET | Freq: Every day | ORAL | Status: DC
  Administered 2024-06-12 (×2): 0.25 mg via ORAL
  Filled 2024-06-12 (×3): qty 1

## 2024-06-12 MED ORDER — TRAZODONE HCL 50 MG PO TABS
150.0000 mg | ORAL_TABLET | Freq: Every day | ORAL | Status: DC
  Administered 2024-06-12: 150 mg via ORAL
  Filled 2024-06-12: qty 1

## 2024-06-12 MED ORDER — PAROXETINE HCL 20 MG PO TABS
25.0000 mg | ORAL_TABLET | Freq: Every day | ORAL | Status: DC
  Administered 2024-06-12: 25 mg via ORAL
  Filled 2024-06-12 (×2): qty 0.5

## 2024-06-12 MED ORDER — THROMBIN FOR PERCUTANEOUS TREATMENT OF PSEUDOANEURYSM (5000UNITS/10ML)
5000.0000 [IU] | Freq: Once | PERCUTANEOUS | Status: AC
  Administered 2024-06-12: 3000 [IU] via PERCUTANEOUS
  Filled 2024-06-12: qty 1

## 2024-06-12 MED ORDER — PAROXETINE HCL 20 MG PO TABS
25.0000 mg | ORAL_TABLET | Freq: Every day | ORAL | Status: DC
  Administered 2024-06-13: 25 mg via ORAL
  Filled 2024-06-12: qty 0.5

## 2024-06-12 MED ORDER — ROSUVASTATIN CALCIUM 5 MG PO TABS
10.0000 mg | ORAL_TABLET | Freq: Every day | ORAL | Status: DC
  Administered 2024-06-12: 10 mg via ORAL
  Filled 2024-06-12: qty 2

## 2024-06-12 MED ORDER — LIDOCAINE HCL (PF) 1 % IJ SOLN
30.0000 mL | Freq: Once | INTRAMUSCULAR | Status: DC
  Filled 2024-06-12 (×2): qty 30

## 2024-06-12 MED ORDER — UMECLIDINIUM-VILANTEROL 62.5-25 MCG/ACT IN AEPB: 1.0000 | INHALATION_SPRAY | Freq: Every day | RESPIRATORY_TRACT | Status: DC | PRN

## 2024-06-12 MED ORDER — TRAZODONE HCL 50 MG PO TABS: 150.0000 mg | ORAL_TABLET | Freq: Every day | ORAL | Status: DC

## 2024-06-12 MED ORDER — FENTANYL CITRATE (PF) 50 MCG/ML IJ SOSY: 25.0000 ug | PREFILLED_SYRINGE | Freq: Once | INTRAMUSCULAR | Status: DC

## 2024-06-12 MED ORDER — SODIUM CHLORIDE 0.9 % IV SOLN: 250.0000 mL | INTRAVENOUS | Status: AC | PRN

## 2024-06-12 MED ORDER — SODIUM CHLORIDE 0.9% FLUSH
3.0000 mL | Freq: Two times a day (BID) | INTRAVENOUS | Status: DC
  Administered 2024-06-12 – 2024-06-13 (×3): 3 mL via INTRAVENOUS

## 2024-06-12 MED ORDER — ACETAMINOPHEN 325 MG PO TABS: 650.0000 mg | ORAL_TABLET | ORAL | Status: AC | PRN

## 2024-06-12 MED ORDER — PAROXETINE HCL 10 MG PO TABS
25.0000 mg | ORAL_TABLET | Freq: Every day | ORAL | Status: DC
  Filled 2024-06-12: qty 2.5

## 2024-06-12 MED ORDER — MAGNESIUM OXIDE -MG SUPPLEMENT 400 (240 MG) MG PO TABS
400.0000 mg | ORAL_TABLET | Freq: Every day | ORAL | Status: DC
Start: 2024-06-12 — End: 2024-06-13
  Administered 2024-06-12 – 2024-06-13 (×2): 400 mg via ORAL
  Filled 2024-06-12 (×2): qty 1

## 2024-06-12 MED ORDER — FAMOTIDINE 20 MG PO TABS
20.0000 mg | ORAL_TABLET | Freq: Two times a day (BID) | ORAL | Status: DC | PRN
Start: 2024-06-12 — End: 2024-06-13

## 2024-06-12 MED ORDER — SODIUM CHLORIDE 0.9% FLUSH: 3.0000 mL | INTRAVENOUS | Status: DC | PRN

## 2024-06-12 MED ORDER — GABAPENTIN 100 MG PO CAPS: 100.0000 mg | ORAL_CAPSULE | Freq: Three times a day (TID) | ORAL | Status: DC | PRN

## 2024-06-12 MED ORDER — LATANOPROST 0.005 % OP SOLN
1.0000 [drp] | Freq: Every day | OPHTHALMIC | Status: DC
  Administered 2024-06-12 (×2): 1 [drp] via OPHTHALMIC
  Filled 2024-06-12: qty 2.5

## 2024-06-12 MED ORDER — TORSEMIDE 20 MG PO TABS: 40.0000 mg | ORAL_TABLET | Freq: Every day | ORAL | Status: DC

## 2024-06-12 MED ORDER — MORPHINE SULFATE (PF) 2 MG/ML IV SOLN: 2.0000 mg | Freq: Once | INTRAVENOUS | Status: DC

## 2024-06-12 MED FILL — Fentanyl Citrate Preservative Free (PF) Inj 100 MCG/2ML: INTRAMUSCULAR | Qty: 2 | Status: CN

## 2024-06-12 MED FILL — Fentanyl Citrate Preservative Free (PF) Inj 100 MCG/2ML: INTRAMUSCULAR | Qty: 2 | Status: AC

## 2024-06-12 NOTE — Progress Notes (Addendum)
 Patient Name: [name] Date of Encounter: 06/12/2024  Primary Cardiologist: Jerel Balding, MD Electrophysiologist: Fonda Kitty, MD  Interval Summary   Sore in the right groin. Lots of ecchymosis.   Vital Signs    Vitals:   06/11/24 2121 06/11/24 2314 06/12/24 0313 06/12/24 0756  BP: 126/61 (!) 108/40 (!) 124/52 (!) 128/43  Pulse: 87 63 66 90  Resp: 15 16 15 15   Temp: 97.7 F (36.5 C) 98.5 F (36.9 C) 97.9 F (36.6 C) 98.4 F (36.9 C)  TempSrc: Oral Oral Oral Oral  SpO2: 100% 99% 100% 95%  Weight:      Height:        Intake/Output Summary (Last 24 hours) at 06/12/2024 1135 Last data filed at 06/11/2024 1400 Gross per 24 hour  Intake 240 ml  Output --  Net 240 ml   Filed Weights   06/11/24 9379  Weight: 75.3 kg    Physical Exam    GEN- NAD, Alert and oriented  Lungs- Clear to ausculation bilaterally, normal work of breathing Cardiac- Regular rate and rhythm, no murmurs, rubs or gallops GI- soft, NT, ND, + BS Extremities- no clubbing or cyanosis. No edema  Telemetry    NSR 60-80s (personally reviewed)  Hospital Course    [name] is a 73 y.o. female with h/o CAD (posterior lateral ventricular branch stent in 2009), chronic diastolic heart failure, longstanding persistent atrial fibrillation, diabetes mellitus type 2, hypertension, pericardiocentesis for hemothorax with tamponade in June 2020, h/o GI bleeding, fall presented for planned outpatient procedure for Watchman implantation.  Assessment & Plan    Secondary hypercoagulable state Chronic anemia History of GI bleeding Recurrent Falls S/p Watchman 06/11/2024 Continue eliquis  5 mg BID  Psuedoaneurysm With ecchymosis US  was ordered which showed partially thrombosed pseudo-aneurysm.  Have asked vascular to see.   Will observe at least today and await Vascular recommendations.    For questions or updates, please contact [facility] Please consult [email or web address] for  contact info under     Signed, Ozell Prentice Passey, PA-C  06/12/2024, 11:35 AM     I have seen, examined the patient, and reviewed the above assessment and plan.    Interval:  No acute overnight events. Patient reports feeling relatively well.  She has ambulated.  She has no tenderness in her groin at rest and denies any bleeding.  No new or acute complaints.   General: Well developed, in no acute distress.  Neck: No JVD.  Cardiac: Normal rate, irregular rhythm.  Resp: Normal work of breathing.  Ext: No edema. R groin is soft but with ecchymosis tracking to suprapubic area. Minimal tenderness to palpation.  Neuro: No gross focal deficits.  Psych: Normal affect.   Problem List: Permanent Atrial Fibrillation Secondary Hypercoagulable State  Chronic anemia History of GI bleeding Recurrent falls  Assessment:  [name] is a 73 year old female who underwent Watchman device implant on 06/11/2024.  Perclose suture broke during deployment, so external figure-of-eight suture was placed for hemostasis.  Patient kept overnight for monitoring.  Some ecchymosis was present tracking to the suprapubic region this morning.  Minimal tenderness to palpation at her groin site.  We performed a vascular ultrasound given the ecchymosis, which identified a partially thrombosed pseudoaneurysm.  Will have our vascular surgery colleagues evaluate her.  Given permanent atrial fibrillation with fresh Watchman device would prefer to continue anticoagulation or at least antiplatelet therapy, if possible.  We will extend bedrest and repeat manual pressure until she can be  evaluated.   Fonda Kitty, MD 06/12/2024 11:43 AM

## 2024-06-12 NOTE — Telephone Encounter (Signed)
 Called [facility] and did not get a hold of someone. I have left a voicemail letting them know we are trying to get in touch to see if patient as has been seen and also if not how we get a referral process started for them

## 2024-06-12 NOTE — Discharge Summary (Cosign Needed)
 Electrophysiology Discharge Summary   Patient ID: [name],  MRN: [id number], DOB/AGE: [birth year] 22 y.o.  Admit date: 06/11/2024 Discharge date: 06/13/2024  Primary Care Physician: Rollene Almarie LABOR, MD  Primary Cardiologist: Jerel Balding, MD  Electrophysiologist: Fonda Kitty, MD     Primary Discharge Diagnosis:  Persistent atrial fibrillation Poor candidacy for long term anticoagulation due to h/o GI bleeding, falls  Secondary Discharge Diagnosis:  CAD  Chronic Diastolic CHF  HTN Pericardiocentesis for Hemothorax with Tamponade GIB  Falls   Procedures This Admission:   Left Atrial Appendage Occlusion (Watchman Procedure) 06/11/2024: Transeptal Puncture Intra-procedural TEE which showed no LAA thrombus Left atrial appendage occlusive device placement on 06/11/24 by Dr. Kitty  - Successful implantation of a 27mm WATCHMAN left atrial appendage occlusive device. No early apparent complications.   Post Implant Anticoagulation Strategy: Continue Eliquis  for the duration of the 6 month medical therapy (Plavix allergy).  Repeat imaging with TEE in 60 days.     VAS US  Lower Extremity Arterial Pseudoaneurysm 06/12/2024: An area with well defined borders measuring 2.8 cm x 2.6 cm was visualized arising off of the CFA with ultrasound characteristics of a  pseudoaneurysm. Mixed echos within the structure suggest that it is partially thrombosed with a residual diameter of 2.7 cm x 1.5 cm. The neck measures approximately 0.4 cm wide and 0.9 cm long.   Underwent successful thrombin injection of right groin pseudoaneurysm.  VAS US  Groin Pseudoaneurysm 06/13/2024: Final read pending but per Dr. Pearline (Vascular Surgery) the pseudoaneurysm was thrombosed.   Brief HPI: [name] is a 73 y.o. female with a history of  persistent atrial fibrillation who was referred to Electrophysiology in the outpatient setting. She was felt to be poor candidate for long-term  anticoagluation due to history of GI bleeding and falls and was felt to be a good candidate for a Watchman procedure.    Hospital Course:  She presented to [facility] on 06/11/2024 and underwent left atrial appendage occlusive device placement as above. She was monitored overnight. Overall, she tolerated the procedure well but was noted to have significant ecchymosis at groin site on POD #1. Ultrasound showed a partially thrombosed pseudoaneurysm as above. Vascular Surgery was consulted and patient underwent successful thrombin injection of right groin pseudoaneurysm. Repeat ultrasound on 11/1 showed that the pseudoaneurysm was thrombosed.  Wound care and restrictions were reviewed with the patient.   The patient has been scheduled for post procedure follow up with EP APP in approximately 6 weeks. Will resume Eliquis  at discharge with plans to continue for 6 months. They will require dental SBE for 6 month post op and should refrain from dental work or cleanings for the first 45 days post implant. SBE to be prescribed at follow up.   A repeat TEE (due to CKD) will be performed in approximately 60 days to ensure proper seal of the device.    Physical Exam: Vitals:   06/12/24 2319 06/13/24 0644 06/13/24 0845 06/13/24 1142  BP: (!) 119/43 99/82 (!) 121/46 (!) 117/41  Pulse: 80 74 75 77  Resp: 14 14 16 20   Temp: 98.3 F (36.8 C) 98.6 F (37 C) 98.4 F (36.9 C) 98.5 F (36.9 C)  TempSrc: Oral Oral Oral Oral  SpO2: 98% 96%  97%  Weight:      Height:       Discharge Medications:  Allergies as of 06/13/2024       Reactions   Benadryl  [diphenhydramine  Hcl] Other (See Comments)  Restless leg   Clopidogrel Bisulfate    Other reaction(s): stomach upset   Ioversol Other (See Comments)   Unknown reaction   Benadryl  [diphenhydramine ] Anxiety   Iodinated Contrast Media Palpitations, Rash   Rapid heart rate, hot Other reaction(s): rapid heartbeat Other reaction(s): flushing         Medication List     TAKE these medications    Accu-Chek Softclix Lancets lancets   acetaminophen  325 MG tablet Commonly known as: TYLENOL  Take 2 tablets (650 mg total) by mouth every 4 (four) hours as needed for headache or mild pain (pain score 1-3).   albuterol  108 (90 Base) MCG/ACT inhaler Commonly known as: VENTOLIN  HFA Inhale 2 puffs into the lungs every 6 (six) hours as needed for wheezing or shortness of breath.   Anoro Ellipta  62.5-25 MCG/ACT Aepb Generic drug: umeclidinium-vilanterol Inhale 1 puff into the lungs daily. What changed:  when to take this reasons to take this   ASCORBIC ACID  PO Take 1 tablet by mouth at bedtime.   benzonatate  200 MG capsule Commonly known as: TESSALON  Take 1 capsule (200 mg total) by mouth 3 (three) times daily as needed. What changed: when to take this   CALCIUM  CITRATE PLUS/MAGNESIUM  PO Take 1 tablet by mouth daily.   diltiazem  120 MG 24 hr capsule Commonly known as: CARDIZEM  CD Take 1 capsule (120 mg total) by mouth daily.   Eliquis  5 MG Tabs tablet Generic drug: apixaban  TAKE ONE TABLET BY MOUTH TWICE DAILY   famotidine  20 MG tablet Commonly known as: PEPCID  Take 1 tablet (20 mg total) by mouth 2 (two) times daily. What changed:  when to take this reasons to take this   FREESTYLE LITE test strip Generic drug: glucose blood Use as instructed   gabapentin  100 MG capsule Commonly known as: NEURONTIN  Take 100 mg by mouth 3 (three) times daily as needed.   HYDROcodone -acetaminophen  5-325 MG tablet Commonly known as: NORCO/VICODIN Take 1 tablet by mouth daily as needed for moderate pain (pain score 4-6) (This is a 30 day supply). What changed:  how much to take when to take this   Janumet  50-1000 MG tablet Generic drug: sitaGLIPtin -metformin  Take 1 tablet by mouth 2 (two) times daily with a meal.   latanoprost  0.005 % ophthalmic solution Commonly known as: XALATAN  Place 1 drop into both eyes at bedtime.    lisinopril  10 MG tablet Commonly known as: ZESTRIL  Take 1 tablet (10 mg total) by mouth daily. What changed: when to take this   LORazepam  0.5 MG tablet Commonly known as: ATIVAN  Take 1 tablet (0.5 mg total) by mouth 2 (two) times daily. What changed:  how much to take when to take this   magnesium  oxide 400 MG tablet Commonly known as: MAG-OX Take 1 tablet (400 mg total) by mouth daily. What changed: when to take this   metoprolol  succinate 50 MG 24 hr tablet Commonly known as: TOPROL -XL Take 1 tablet (50 mg total) by mouth 2 (two) times daily. Take with or immediately following a meal.   nystatin  powder Commonly known as: MYCOSTATIN /NYSTOP  Apply topically once a week. What changed:  how much to take when to take this additional instructions   nystatin  ointment Commonly known as: MYCOSTATIN  Apply 1 Application topically 2 (two) times daily as needed (IRRITATION). What changed: when to take this   PARoxetine  10 MG tablet Commonly known as: PAXIL  Take 2.5 tablets (25 mg total) by mouth daily. What changed: when to take this   predniSONE   50 MG tablet Commonly known as: DELTASONE  Take Prednisone  1 tablet (50 mg) by mouth on 10/29 at 6:30PM. Take the second tablet on 10/30 at 12:30AM.   Prevagen Extra Strength 20 MG Caps Generic drug: Apoaequorin Take 20 mg by mouth daily.   rOPINIRole  0.5 MG tablet Commonly known as: REQUIP  Take 1 tablet (0.5 mg total) by mouth at bedtime. What changed: how much to take   rosuvastatin  10 MG tablet Commonly known as: CRESTOR  Take 1 tablet (10 mg total) by mouth at bedtime.   SALONPAS EX Apply 1 patch topically daily as needed.   senna 8.6 MG Tabs tablet Commonly known as: SENOKOT Take 1 tablet by mouth at bedtime as needed for mild constipation.   torsemide  20 MG tablet Commonly known as: DEMADEX  Take 40 mg BID alternating with 40mg  once daily   traZODone  100 MG tablet Commonly known as: DESYREL  take UP TO 2 TABS  (200 MG) BY MOUTH AT bedtime FOR SLEEP What changed:  how much to take how to take this when to take this additional instructions   triamcinolone  cream 0.1 % Commonly known as: KENALOG  Apply 1 Application topically 2 (two) times daily. What changed:  when to take this reasons to take this   VITAMIN D3 PO Take 1 capsule by mouth at bedtime.        Disposition:  Home with usual follow up as in AVS  Signed, Callie E Goodrich, PA-C    I have seen, examined the patient, and reviewed the above assessment and plan.    Hospital course: Patient presented on 10/30 for planned Watchman device implant in the setting of atrial fibrillation and chronic anemia/recurrent bleeding.  She underwent successful implantation of a 27 mm Watchman Flex Pro left atrial appendage occlusion device.  She had some oozing from her right groin so was kept overnight for monitoring.  The next morning there was bruising into the suprapubic area.  Right groin ultrasound was performed and found a pseudoaneurysm.  Vascular surgery was consulted and thrombin was injected.  Ultrasound repeated the following day showed thrombosis of the pseudoaneurysm.  Patient had mild pain with palpation, otherwise pain-free, even during ambulation.  There was no ongoing or active bleeding at the site.  She was discharged with follow-up arranged.  General: Well developed, in no acute distress.  Neck: No JVD.  Cardiac: Normal rate, irregular regular rhythm.  Resp: Normal work of breathing.  Ext: No edema.  Right groin soft and mildly tender.  Ecchymosis tracking into the suprapubic region. Neuro: No gross focal deficits.  Psych: Normal affect.   Assessment and Plan:  # Permanent atrial fibrillation # Secondary hypercoagulable state due to atrial fibrillation # Chronic anemia and recurrent GI bleeding -Continue Eliquis  for 6 months then transition to Plavix monotherapy.  She has aspirin  intolerance.. Repeat imaging with TEE in 60  days due to contrast allergy.   # Right groin pseudoaneurysm -Status post thrombin injection.  Thrombosed on repeat ultrasound.  Vascular surgery following.  Appreciate assistance. -Reviewed activity restrictions in detail with patient. -Will have patient return in 2 weeks for clinic visit.  Duration of discharge encounter 35 minutes.  Fonda Kitty, MD 06/16/2024 10:09 PM

## 2024-06-12 NOTE — Progress Notes (Signed)
 VASCULAR LAB    Thrombin injection of right groin pseudoaneurysm has been performed.  See CV proc for preliminary results.   Avalina Benko, RVT 06/12/2024, 2:55 PM

## 2024-06-12 NOTE — Op Note (Signed)
    06/12/2024 2:52 PM  Preoperative Diagnosis: Right common femoral pseudoaneurysm  Postoperative Diagnosis: Same  Procedure Performed: Right common femoral pseudoaneurysm duplex directed thrombin injection  Surgeon: Penne BROCKS. Sheree, MD  HISTORY:This patient  with recent Watchman procedure via the right common femoral artery now has a groin pseudoaneurysm with surrounding ecchymosis. The risks, benefits, and indications for a duplex-directed thrombin injection were described and consent was signed.   OPERATION: After timeout was performed, the patient was  prepped and draped in standard surgical fashion. The right groin pseudoaneurysm was shown by gray scale and color duplex imaging and confirmed to and fro flow.  The lidocaine  was used to anesthetize the skin. An micropuncture needle was placed into the pseudoaneurysm. Approximately 3 thousand units of thrombin were injected.  Cessation of flow was observed after injection of the thrombin, giving a satisfactory result and limited duplex of the right lower extremity demonstrated patency of distal arteries.  The patient tolerated the procedure well.    Plan for repeat duplex tomorrow.  Mellonie Guess C. Sheree, MD [facility] Office: [phone] Pager: [phone]

## 2024-06-12 NOTE — Consult Note (Signed)
 Hospital Consult    Reason for Consult:  Right groin pain Referring Physician:  Dr. Kennyth MRN #:  [id number]  History of Present Illness: This is a 73 y.o. female underwent left atrial appendage occlusive device yesterday.  Noted to have groin swelling and pain with movement today.  At rest she is free of pain.  She has undergone ultrasound which demonstrates right femoral pseudoaneurysm.  Past Medical History:  Diagnosis Date   Anemia, unspecified    Arthritis    Asthma    Atrial fibrillation with RVR (HCC)    a. on Xarelto    Bell's palsy    Facial nerve decompression in 2001   CHF (congestive heart failure) (HCC)    Chronic low back pain    COPD with asthma (HCC)    Coronary artery disease    Myoview  04/12/11 was entirely normal. ECHO 02/26/08 showed only minor abnormalities. Stenting 05/26/08 of her posterolateral branch to the left circumflex coronary artery. 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   27 mm device, JP   Shortness of breath dyspnea    ECHO 02/26/08 showed only minor abnormalities   Spinal stenosis     Past Surgical History:  Procedure Laterality Date   ABDOMINAL HYSTERECTOMY  1982   & BSO; for polycystic ovary disease   CARDIOVERSION N/A 12/17/2013   Procedure: CARDIOVERSION;  Surgeon: Vinie KYM Maxcy, MD;  Location: [facility] ENDOSCOPY;  Service: Cardiovascular;  Laterality: N/A;   CARDIOVERSION N/A 09/30/2015   Procedure: CARDIOVERSION;  Surgeon: Leim VEAR Moose, MD;  Location: [facility] ENDOSCOPY;  Service: Cardiovascular;  Laterality: N/A;   CARDIOVERSION N/A 06/22/2016   Procedure: CARDIOVERSION;  Surgeon: Annabella Scarce, MD;  Location: [facility] ENDOSCOPY;  Service: Cardiovascular;  Laterality: N/A;   CENTRAL LINE INSERTION  01/13/2019   Procedure: CENTRAL LINE INSERTION;  Surgeon: Cherrie Toribio SAUNDERS, MD;  Location: [facility] INVASIVE CV LAB;  Service: Cardiovascular;;   COLONOSCOPY     last 2009; Dr Donnald; due 2019   COLONOSCOPY WITH PROPOFOL  N/A 01/23/2018   Procedure: COLONOSCOPY WITH PROPOFOL ;  Surgeon: Donnald Charleston, MD;  Location: [facility] ENDOSCOPY;  Service: Endoscopy;  Laterality: N/A;   COLONOSCOPY WITH PROPOFOL  N/A 11/04/2020   Procedure: COLONOSCOPY WITH PROPOFOL ;  Surgeon: Donnald Charleston, MD;  Location: [facility] ENDOSCOPY;  Service: Endoscopy;  Laterality: N/A;   CORONARY ANGIOPLASTY  05/26/2008   Stenting of her posterolateral branch to the left circumflex coronary artery. Used a 2.5x42mm Taxus Monorail stent.   ESOPHAGOGASTRODUODENOSCOPY (EGD) WITH PROPOFOL  N/A 01/23/2018   Procedure: ESOPHAGOGASTRODUODENOSCOPY (EGD) WITH PROPOFOL ;  Surgeon: Donnald Charleston, MD;  Location: [facility] ENDOSCOPY;  Service: Endoscopy;  Laterality: N/A;   ESOPHAGOGASTRODUODENOSCOPY (EGD) WITH PROPOFOL  N/A 11/04/2020   Procedure: ESOPHAGOGASTRODUODENOSCOPY (EGD) WITH PROPOFOL ;  Surgeon: Donnald Charleston, MD;  Location: [facility] ENDOSCOPY;  Service: Endoscopy;  Laterality: N/A;   FACIAL NERVE DECOMPRESSION  2001/2002   bells palsy  GIVENS CAPSULE STUDY N/A 11/04/2020   Procedure: GIVENS CAPSULE STUDY;  Surgeon: Donnald Charleston, MD;  Location: [facility] ENDOSCOPY;  Service: Endoscopy;  Laterality: N/A;   HOT HEMOSTASIS N/A 11/04/2020   Procedure: HOT HEMOSTASIS (ARGON PLASMA COAGULATION/BICAP);  Surgeon: Donnald Charleston, MD;  Location: [facility] ENDOSCOPY;  Service: Endoscopy;  Laterality: N/A;   LAPAROSCOPIC CHOLECYSTECTOMY  06/15/2011    Dr Gail   LEFT ATRIAL APPENDAGE OCCLUSION N/A 06/11/2024    Procedure: LEFT ATRIAL APPENDAGE OCCLUSION;  Surgeon: Kennyth Chew, MD;  Location: [facility] INVASIVE CV LAB;  Service: Cardiovascular;  Laterality: N/A;   PERICARDIOCENTESIS N/A 01/13/2019   Procedure: PERICARDIOCENTESIS;  Surgeon: Verlin Lonni BIRCH, MD;  Location: [facility] INVASIVE CV LAB;  Service: Cardiovascular;  Laterality: N/A;   POLYPECTOMY  11/04/2020   Procedure: POLYPECTOMY;  Surgeon: Donnald Charleston, MD;  Location: [facility] ENDOSCOPY;  Service: Endoscopy;;   RIGHT AND LEFT HEART CATH N/A 01/13/2019   Procedure: RIGHT AND LEFT HEART CATH;  Surgeon: Cherrie Toribio SAUNDERS, MD;  Location: [facility] INVASIVE CV LAB;  Service: Cardiovascular;  Laterality: N/A;   TEE WITHOUT CARDIOVERSION N/A 12/17/2013   Procedure: TRANSESOPHAGEAL ECHOCARDIOGRAM (TEE);  Surgeon: Vinie KYM Maxcy, MD;  Location: [facility] ENDOSCOPY;  Service: Cardiovascular;  Laterality: N/A;  trish/ja   TOTAL HIP ARTHROPLASTY Left 09/25/2021   Procedure: LEFT TOTAL HIP ARTHROPLASTY ANTERIOR APPROACH;  Surgeon: Liam Lerner, MD;  Location: [facility] ORS;  Service: Orthopedics;  Laterality: Left;   TRANSESOPHAGEAL ECHOCARDIOGRAM (CATH LAB) N/A 05/01/2024   Procedure: TRANSESOPHAGEAL ECHOCARDIOGRAM;  Surgeon: Francyne Headland, MD;  Location: [facility] INVASIVE CV LAB;  Service: Cardiovascular;  Laterality: N/A;   TRANSESOPHAGEAL ECHOCARDIOGRAM (CATH LAB) N/A 06/11/2024   Procedure: TRANSESOPHAGEAL ECHOCARDIOGRAM;  Surgeon: Kennyth Chew, MD;  Location: [facility] INVASIVE CV LAB;  Service: Cardiovascular;  Laterality: N/A;   TRANSTHORACIC ECHOCARDIOGRAM  02/26/2008   LV systolic function normal with mild conc LVH; LA mildly dilated; trace MR/TR   UPPER GI ENDOSCOPY  2009   negative    Allergies  Allergen Reactions   Benadryl  [Diphenhydramine  Hcl] Other (See Comments)    Restless leg   Clopidogrel Bisulfate     Other reaction(s): stomach upset   Ioversol Other (See Comments)    Unknown reaction   Benadryl  [Diphenhydramine ] Anxiety   Iodinated Contrast Media Palpitations and  Rash    Rapid heart rate, hot Other reaction(s): rapid heartbeat  Other reaction(s): flushing    Prior to Admission medications   Medication Sig Start Date End Date Taking? Authorizing Provider  albuterol  (VENTOLIN  HFA) 108 (90 Base) MCG/ACT inhaler Inhale 2 puffs into the lungs every 6 (six) hours as needed for wheezing or shortness of breath. 03/06/24  Yes Young, Clinton D, MD  Apoaequorin (PREVAGEN EXTRA STRENGTH) 20 MG CAPS Take 20 mg by mouth daily.   Yes [provider]  ASCORBIC ACID  PO Take 1 tablet by mouth at bedtime.   Yes [provider]  benzonatate  (TESSALON ) 200 MG capsule Take 1 capsule (200 mg total) by mouth 3 (three) times daily as needed. Patient taking differently: Take 200 mg by mouth 2 (two) times daily. 04/01/24  Yes Rollene Almarie LABOR, MD  Camphor-Menthol -Methyl Sal (SALONPAS EX) Apply 1 patch topically daily as needed.   Yes [provider]  Cholecalciferol (VITAMIN D3 PO) Take 1 capsule by mouth at bedtime.   Yes [provider]  diltiazem  (CARDIZEM  CD) 120 MG 24 hr capsule Take 1 capsule (120 mg total) by mouth daily. 05/15/24 06/14/24 Yes Cindy Garnette POUR, MD  ELIQUIS  5 MG  TABS tablet TAKE ONE TABLET BY MOUTH TWICE DAILY 02/27/24  Yes Croitoru, Mihai, MD  famotidine  (PEPCID ) 20 MG tablet Take 1 tablet (20 mg total) by mouth 2 (two) times daily. Patient taking differently: Take 20 mg by mouth 2 (two) times daily as needed for heartburn or indigestion. 04/23/23  Yes Burns, Glade PARAS, MD  gabapentin  (NEURONTIN ) 100 MG capsule Take 100 mg by mouth 3 (three) times daily as needed.   Yes [provider]  HYDROcodone -acetaminophen  (NORCO/VICODIN) 5-325 MG tablet Take 1 tablet by mouth daily as needed for moderate pain (pain score 4-6) (This is a 30 day supply). Patient taking differently: Take 0.5 tablets by mouth at bedtime. 06/08/24  Yes Rollene Almarie LABOR, MD  latanoprost  (XALATAN ) 0.005 % ophthalmic solution Place 1 drop into  both eyes at bedtime. 02/07/21  Yes [provider]  lisinopril  (ZESTRIL ) 10 MG tablet Take 1 tablet (10 mg total) by mouth daily. Patient taking differently: Take 10 mg by mouth at bedtime. 05/22/24  Yes Croitoru, Mihai, MD  LORazepam  (ATIVAN ) 0.5 MG tablet Take 1 tablet (0.5 mg total) by mouth 2 (two) times daily. Patient taking differently: Take 1 mg by mouth at bedtime. 06/08/24  Yes Rollene Almarie LABOR, MD  magnesium  oxide (MAG-OX) 400 MG tablet Take 1 tablet (400 mg total) by mouth daily. Patient taking differently: Take 400 mg by mouth at bedtime. 05/26/24  Yes Leverne Charlies Helling, PA-C  metoprolol  succinate (TOPROL -XL) 50 MG 24 hr tablet Take 1 tablet (50 mg total) by mouth 2 (two) times daily. Take with or immediately following a meal. 05/14/24 06/13/24 Yes Cindy Garnette POUR, MD  Multiple Minerals-Vitamins (CALCIUM  CITRATE PLUS/MAGNESIUM  PO) Take 1 tablet by mouth daily.   Yes [provider]  nystatin  (MYCOSTATIN /NYSTOP ) powder Apply topically once a week. Patient taking differently: Apply 1 Application topically See admin instructions. 3 times a week 05/17/23  Yes Rollene Almarie LABOR, MD  nystatin  ointment (MYCOSTATIN ) Apply 1 Application topically 2 (two) times daily as needed (IRRITATION). Patient taking differently: Apply 1 Application topically daily as needed (IRRITATION). 05/17/23  Yes Rollene Almarie LABOR, MD  PARoxetine  (PAXIL ) 10 MG tablet Take 2.5 tablets (25 mg total) by mouth daily. Patient taking differently: Take 25 mg by mouth every morning. 05/17/23  Yes Rollene Almarie LABOR, MD  predniSONE  (DELTASONE ) 50 MG tablet Take Prednisone  1 tablet (50 mg) by mouth on 10/29 at 6:30PM. Take the second tablet on 10/30 at 12:30AM. 06/02/24  Yes Kennyth Chew, MD  rOPINIRole  (REQUIP ) 0.5 MG tablet Take 1 tablet (0.5 mg total) by mouth at bedtime. Patient taking differently: Take 0.25 mg by mouth at bedtime. 01/21/23  Yes Dohmeier, Dedra, MD  rosuvastatin  (CRESTOR ) 10 MG  tablet Take 1 tablet (10 mg total) by mouth at bedtime. 05/04/24  Yes Croitoru, Mihai, MD  senna (SENOKOT) 8.6 MG TABS Take 1 tablet by mouth at bedtime as needed for mild constipation.   Yes [provider]  sitaGLIPtin -metformin  (JANUMET ) 50-1000 MG tablet Take 1 tablet by mouth 2 (two) times daily with a meal. 05/25/24  Yes Rollene Almarie LABOR, MD  torsemide  (DEMADEX ) 20 MG tablet Take 40 mg BID alternating with 40mg  once daily 05/26/24  Yes Leverne Charlies Helling, PA-C  traZODone  (DESYREL ) 100 MG tablet take UP TO 2 TABS (200 MG) BY MOUTH AT bedtime FOR SLEEP Patient taking differently: Take 150 mg by mouth at bedtime. Rx written to allow patient to take up to 2 tabs at bedtime, pt states she takes 1.5 tabs  often 05/21/24  Yes Camara, Pastor, MD  triamcinolone  cream (KENALOG ) 0.1 % Apply 1 Application topically 2 (two) times daily. Patient taking differently: Apply 1 Application topically at bedtime as needed. 05/17/23  Yes Rollene Almarie LABOR, MD  umeclidinium-vilanterol (ANORO ELLIPTA ) 62.5-25 MCG/ACT AEPB Inhale 1 puff into the lungs daily. Patient taking differently: Inhale 1 puff into the lungs daily as needed (Breathing). 04/27/23  Yes Neysa Rama D, MD  Accu-Chek Softclix Lancets lancets  05/17/23   Rollene Almarie LABOR, MD  acetaminophen  (TYLENOL ) 325 MG tablet Take 2 tablets (650 mg total) by mouth every 4 (four) hours as needed for headache or mild pain (pain score 1-3). 06/12/24   Lesia Ozell Barter, PA-C  glucose blood (FREESTYLE LITE) test strip Use as instructed 05/17/23   Rollene Almarie LABOR, MD    Social History   Socioeconomic History   Marital status: Married    Spouse name: Not on file   Number of children: 2   Years of education: master's   Highest education level: Not on file  Occupational History   Occupation: Doctor, General Practice    Employer: [employer]  Tobacco Use   Smoking status: Former    Current packs/day: 0.00    Average packs/day:  1.5 packs/day for 30.0 years (45.0 ttl pk-yrs)    Types: Cigarettes    Start date: 08/14/1963    Quit date: 08/13/1993    Years since quitting: 30.8   Smokeless tobacco: Never  Vaping Use   Vaping status: Never Used  Substance and Sexual Activity   Alcohol use: No   Drug use: No   Sexual activity: Not on file  Other Topics Concern   Not on file  Social History Narrative   ** Merged History Encounter **       Social Drivers of Health   Financial Resource Strain: Not on file  Food Insecurity: No Food Insecurity (06/11/2024)   Hunger Vital Sign    Worried About Running Out of Food in the Last Year: Never true    Ran Out of Food in the Last Year: Never true  Recent Concern: Food Insecurity - Food Insecurity Present (05/12/2024)   Hunger Vital Sign    Worried About Running Out of Food in the Last Year: Sometimes true    Ran Out of Food in the Last Year: Never true  Transportation Needs: No Transportation Needs (06/11/2024)   PRAPARE - Administrator, Civil Service (Medical): No    Lack of Transportation (Non-Medical): No  Physical Activity: Not on file  Stress: Not on file  Social Connections: Moderately Isolated (06/11/2024)   Social Connection and Isolation Panel    Frequency of Communication with Friends and Family: More than three times a week    Frequency of Social Gatherings with Friends and Family: More than three times a week    Attends Religious Services: Never    Database Administrator or Organizations: No    Attends Banker Meetings: Never    Marital Status: Married  Catering Manager Violence: Not At Risk (06/12/2024)   Humiliation, Afraid, Rape, and Kick questionnaire    Fear of Current or Ex-Partner: No    Emotionally Abused: No    Physically Abused: No    Sexually Abused: No     Family History  Problem Relation Age of Onset   Heart attack Father 28       2nd MI at 11   Colon cancer Brother 3  Gout Brother    Ulcers Mother     Emphysema Mother 37   Colon polyps Sister    Cancer Sister        Basal cell carcinoma   Pneumonia Maternal Grandmother    Hypertension Brother    Hyperlipidemia Brother    Cancer Brother        Skin   Diabetes Neg Hx    Stroke Neg Hx     ROS: as above  Physical Examination  Vitals:   06/12/24 0756 06/12/24 1319  BP: (!) 128/43 120/74  Pulse: 90 76  Resp: 15 17  Temp: 98.4 F (36.9 C) 98.7 F (37.1 C)  SpO2: 95% 98%   Body mass index is 30.36 kg/m.  Awake alert and oriented Non labored respirations 1+ Right posterior tibial pulse Right groin with ecchymoses   CBC    Component Value Date/Time   WBC 7.7 06/11/2024 0635   RBC 3.54 (L) 06/11/2024 0635   HGB 10.5 (L) 06/11/2024 0635   HGB 10.4 (L) 06/08/2024 1518   HCT 29.7 (L) 06/11/2024 0635   HCT 33.1 (L) 06/08/2024 1518   PLT 168 06/11/2024 0635   PLT 181 06/08/2024 1518   MCV 83.9 06/11/2024 0635   MCV 93 06/08/2024 1518   MCH 29.7 06/11/2024 0635   MCHC 35.4 06/11/2024 0635   RDW 18.1 (H) 06/11/2024 0635   RDW 16.6 (H) 06/08/2024 1518   LYMPHSABS 0.6 (L) 05/11/2024 2352   MONOABS 0.6 05/11/2024 2352   EOSABS 0.1 05/11/2024 2352   BASOSABS 0.1 05/11/2024 2352    BMET    Component Value Date/Time   NA 129 (L) 06/08/2024 1517   K 4.4 06/08/2024 1517   CL 88 (L) 06/08/2024 1517   CO2 24 06/08/2024 1517   GLUCOSE 171 (H) 06/08/2024 1517   GLUCOSE 113 (H) 05/14/2024 0444   BUN 31 (H) 06/08/2024 1517   CREATININE 1.29 (H) 06/08/2024 1517   CREATININE 1.26 (H) 03/14/2020 1617   CALCIUM  9.6 06/08/2024 1517   GFRNONAA 58 (L) 05/14/2024 0444   GFRAA 42 (L) 03/08/2020 1207    COAGS: Lab Results  Component Value Date   INR 1.2 08/29/2022   INR 1.2 07/27/2022   INR 1.3 (H) 11/04/2020     Non-Invasive Vascular Imaging:    Exam: Right groin  Indications: Patient complains of Ecchymosis.  History: Status post left atrial appendage device placement 06/11/2024  (Watchman).  Limitations: Body  habitus, tissue properties   Comparison Study: No prior study on file   Performing Technologist: Alberta Lis RVS   Supporting Technologist: Ricka Bosch RDMS, RVT     Examination Guidelines: A complete evaluation includes B-mode imaging,  spectral  Doppler, color Doppler, and power Doppler as needed of all accessible  portions  of each vessel. Bilateral testing is considered an integral part of a  complete  examination. Limited examinations for reoccurring indications may be  performed  as noted.      Findings:   An area with well defined borders measuring 2.8 cm x 2.6 cm was visualized  arising off of the CFA with ultrasound characteristics of a  pseudoaneurysm. Mixed echos within the structure suggest that it is  partially thrombosed with a residual diameter of  2.7 cm x 1.5 cm. The neck measures approximately 0.4 cm wide and 0.9 cm  long.      ASSESSMENT/PLAN: This is a 73 y.o. female with R CFA psa s/p watchman device placement. Plan for thrombin injection today.  We discussed the risk alternatives and she demonstrated understanding and consent was signed.    Garhett Bernhard C. Sheree, MD [facility] Office: [phone] Pager: [phone]

## 2024-06-12 NOTE — Telephone Encounter (Signed)
 Left message for Amy Dec. Patient Support/Referral Coordinator with [facility]

## 2024-06-12 NOTE — Progress Notes (Signed)
 PT brought to [facility]- Vasc. Ultra sound present with MD; MD able to perform Pseudo aneurysm injection with thrombin provide from pharmacy (3,00 units)- Pt tolerated procedure well.

## 2024-06-12 NOTE — Progress Notes (Signed)
  Site appears stable but with some ecchymosis.   Will perform US  prior to discharge.   Ozell Jodie Passey, PA-C  06/12/2024 8:34 AM

## 2024-06-12 NOTE — Progress Notes (Signed)
 VASCULAR LAB    Right duplex of groin has been performed.  See CV proc for preliminary results.   Crystall Donaldson, RVT 06/12/2024, 11:15 AM

## 2024-06-12 NOTE — Plan of Care (Signed)
  Problem: Education: Goal: Knowledge of cardiac device and self-care will improve Outcome: Progressing Goal: Ability to safely manage health related needs after discharge will improve Outcome: Progressing   Problem: Cardiac: Goal: Ability to achieve and maintain adequate cardiopulmonary perfusion will improve Outcome: Progressing   

## 2024-06-12 NOTE — Progress Notes (Signed)
 Mobility Specialist Progress Note:    06/12/24 0909  Mobility  Activity Ambulated with assistance  Level of Assistance Standby assist, set-up cues, supervision of patient - no hands on  Assistive Device Front wheel walker  Distance Ambulated (ft) 300 ft  Activity Response Tolerated well  Mobility Referral Yes  Mobility visit 1 Mobility  Mobility Specialist Start Time (ACUTE ONLY) 0909  Mobility Specialist Stop Time (ACUTE ONLY) [id number]  Mobility Specialist Time Calculation (min) (ACUTE ONLY) 12 min   Pt received in bed, agreeable to mobility session. Ambulated in hallway with all needs met. Tolerated well, asx throughout. Returned to room. Left with all needs met.  Carrigan Delafuente Mobility Specialist Please contact via Special Educational Needs Teacher or  Rehab office at [phone]

## 2024-06-12 NOTE — Plan of Care (Signed)
  Problem: Education: Goal: Knowledge of cardiac device and self-care will improve Outcome: Progressing Goal: Ability to safely manage health related needs after discharge will improve Outcome: Progressing Goal: Individualized Educational Video(s) Outcome: Progressing   Problem: Cardiac: Goal: Ability to achieve and maintain adequate cardiopulmonary perfusion will improve Outcome: Progressing   Problem: Education: Goal: Knowledge of General Education information will improve Description: Including pain rating scale, medication(s)/side effects and non-pharmacologic comfort measures Outcome: Progressing   Problem: Health Behavior/Discharge Planning: Goal: Ability to manage health-related needs will improve Outcome: Progressing

## 2024-06-13 ENCOUNTER — Inpatient Hospital Stay (HOSPITAL_COMMUNITY)

## 2024-06-13 DIAGNOSIS — I729 Aneurysm of unspecified site: Secondary | ICD-10-CM | POA: Diagnosis not present

## 2024-06-13 DIAGNOSIS — I724 Aneurysm of artery of lower extremity: Secondary | ICD-10-CM | POA: Diagnosis not present

## 2024-06-13 DIAGNOSIS — T81718A Complication of other artery following a procedure, not elsewhere classified, initial encounter: Secondary | ICD-10-CM

## 2024-06-13 DIAGNOSIS — Z95818 Presence of other cardiac implants and grafts: Secondary | ICD-10-CM | POA: Diagnosis not present

## 2024-06-13 DIAGNOSIS — I4821 Permanent atrial fibrillation: Secondary | ICD-10-CM | POA: Diagnosis not present

## 2024-06-13 LAB — GLUCOSE, CAPILLARY
Glucose-Capillary: 141 mg/dL — ABNORMAL HIGH (ref 70–99)
Glucose-Capillary: 195 mg/dL — ABNORMAL HIGH (ref 70–99)

## 2024-06-13 NOTE — Progress Notes (Signed)
 VASCULAR LAB    Right duplex of groin post thrombin injection has been performed.  See CV proc for preliminary results.   Tamecka Milham, RVT 06/13/2024, 10:09 AM

## 2024-06-13 NOTE — Progress Notes (Signed)
  Progress Note  Patient Name: [name] Date of Encounter: 06/13/2024 [facility] Cardiologist: Jerel Balding, MD   Interval Summary   Thrombin injection to R groin pseudoaneurysm yesterday. No acute overnight events. Sleeping comfortably upon arrival. Easily arousable. Reports feeling relatively well.   Vital Signs Vitals:   06/12/24 2136 06/12/24 2156 06/12/24 2319 06/13/24 0644  BP: (!) 115/44 (!) 115/44 (!) 119/43 99/82  Pulse: 87 87 80 74  Resp: 16  14 14   Temp:   98.3 F (36.8 C) 98.6 F (37 C)  TempSrc:   Oral Oral  SpO2: 98%  98% 96%  Weight:      Height:        Intake/Output Summary (Last 24 hours) at 06/13/2024 0829 Last data filed at 06/12/2024 2205 Gross per 24 hour  Intake 3 ml  Output --  Net 3 ml      06/11/2024    6:20 AM 06/08/2024    3:39 PM 05/27/2024    3:47 PM  Last 3 Weights  Weight (lbs) 166 lb 169 lb 168 lb  Weight (kg) 75.297 kg 76.658 kg 76.204 kg      Telemetry/ECG  AF - Personally Reviewed  Physical Exam  General: Well developed, in no acute distress.  Neck: No JVD.  Cardiac: Normal rate, regular rhythm.  Resp: Normal work of breathing.  Ext: No edema. R groin mildly tender to palpation, ecchymosis tracking to suprapubic area.  Neuro: No gross focal deficits.  Psych: Normal affect.    Assessment & Plan  S/p Watchman device R groin pseudoaneurysm Permanent atrial fibrillation Secondary hypercoagulable state  Chronic anemia History of GI bleeding Recurrent falls   Assessment:  [name] is a 73 year old female who underwent Watchman device implant on 06/11/2024 given her history of chronic anemia and recurrent bleeding in setting of permanent AF.  Perclose suture broke during deployment, so external figure-of-eight suture was placed for hemostasis.  Patient kept overnight for monitoring.  Some ecchymosis was present tracking to the suprapubic region this morning.  Minimal tenderness to palpation at her groin  site.  We performed a vascular ultrasound given the ecchymosis, which identified a partially thrombosed pseudoaneurysm.   -She underwent thrombin injection on 10/31 for management of pseudoaneurysm. Vascular surgery following, appreciate assistance. Planning for repeat ultrasound today.  -Continue bedrest until ultrasound has been completed.  -Will hold AM Eliquis  until results of ultrasound are back.   Signed, Fonda Kitty, MD

## 2024-06-13 NOTE — Progress Notes (Signed)
 AVS reviewed with patient and wife about medications to take, follow up instructions, and activity restrictions. Patient verbalized understanding of all instructions and all questions were answered. IV and telemetry were removed without difficulty. Patient was discharged to care of wife. Patient was transported with belongings to private vehicle by this nurse.   Jacquline Cooter BSN RN

## 2024-06-13 NOTE — Progress Notes (Addendum)
  Progress Note    06/13/2024 6:54 AM 2 Days Post-Op  Subjective:  feeling better.    afebrile  Vitals:   06/12/24 2319 06/13/24 0644  BP: (!) 119/43 99/82  Pulse: 80 74  Resp: 14 14  Temp: 98.3 F (36.8 C) 98.6 F (37 C)  SpO2: 98% 96%    Physical Exam: General:  no distress Cardiac:  irregular Lungs:  non labored Incisions:  right groin with ecchymosis; non tender Extremities:  brisk right PT doppler flow Abdomen:  soft  CBC    Component Value Date/Time   WBC 7.7 06/11/2024 0635   RBC 3.54 (L) 06/11/2024 0635   HGB 10.5 (L) 06/11/2024 0635   HGB 10.4 (L) 06/08/2024 1518   HCT 29.7 (L) 06/11/2024 0635   HCT 33.1 (L) 06/08/2024 1518   PLT 168 06/11/2024 0635   PLT 181 06/08/2024 1518   MCV 83.9 06/11/2024 0635   MCV 93 06/08/2024 1518   MCH 29.7 06/11/2024 0635   MCHC 35.4 06/11/2024 0635   RDW 18.1 (H) 06/11/2024 0635   RDW 16.6 (H) 06/08/2024 1518   LYMPHSABS 0.6 (L) 05/11/2024 2352   MONOABS 0.6 05/11/2024 2352   EOSABS 0.1 05/11/2024 2352   BASOSABS 0.1 05/11/2024 2352    BMET    Component Value Date/Time   NA 129 (L) 06/08/2024 1517   K 4.4 06/08/2024 1517   CL 88 (L) 06/08/2024 1517   CO2 24 06/08/2024 1517   GLUCOSE 171 (H) 06/08/2024 1517   GLUCOSE 113 (H) 05/14/2024 0444   BUN 31 (H) 06/08/2024 1517   CREATININE 1.29 (H) 06/08/2024 1517   CREATININE 1.26 (H) 03/14/2020 1617   CALCIUM  9.6 06/08/2024 1517   GFRNONAA 58 (L) 05/14/2024 0444   GFRAA 42 (L) 03/08/2020 1207    INR    Component Value Date/Time   INR 1.2 08/29/2022 2055     Intake/Output Summary (Last 24 hours) at 06/13/2024 0654 Last data filed at 06/12/2024 2205 Gross per 24 hour  Intake 3 ml  Output --  Net 3 ml      Assessment/Plan:  73 y.o. female is s/p:  Right common femoral pseudoaneurysm duplex directed thrombin injection after Watchman procedure   2 Days Post-Op   -subjectively feeling better.  -for right psa duplex today.  Bed rest until  completed.    Lucie Apt, PA-C [facility] [phone] 06/13/2024 6:54 AM  VASCULAR STAFF ADDENDUM: I have independently interviewed and examined the patient. I agree with the above.  Right groin site feeling much better this morning, but less tender.  Plan for groin duplex this morning.  Please keep on bedrest until completion of that test.  Norman GORMAN Serve MD [facility] Phone Number: [phone] 06/13/2024 8:15 AM

## 2024-06-14 ENCOUNTER — Other Ambulatory Visit: Payer: Self-pay | Admitting: Student

## 2024-06-14 MED ORDER — DILTIAZEM HCL ER COATED BEADS 120 MG PO CP24: 120.0000 mg | ORAL_CAPSULE | Freq: Every day | ORAL | 1 refills | Status: DC

## 2024-06-14 MED ORDER — METOPROLOL SUCCINATE ER 50 MG PO TB24: 50.0000 mg | ORAL_TABLET | Freq: Two times a day (BID) | ORAL | 1 refills | Status: DC

## 2024-06-15 ENCOUNTER — Other Ambulatory Visit: Payer: Self-pay | Admitting: Neurology

## 2024-06-15 ENCOUNTER — Other Ambulatory Visit: Payer: Self-pay

## 2024-06-15 ENCOUNTER — Telehealth: Payer: Self-pay

## 2024-06-15 DIAGNOSIS — Z95818 Presence of other cardiac implants and grafts: Secondary | ICD-10-CM

## 2024-06-15 DIAGNOSIS — I4821 Permanent atrial fibrillation: Secondary | ICD-10-CM

## 2024-06-15 NOTE — Telephone Encounter (Signed)
 Tried to call patient to do Watchman TOC call. Spouse answered the phone. She will have patient call  back.  She stated patient is purple from the navel down. She has pain when getting to a standing position but is not painful when standing or walking.   Will await return call.

## 2024-06-16 NOTE — Telephone Encounter (Signed)
  [facility]   [facility] Team  Contacted the patient regarding discharge from [facility] on 06/13/2024.  The patient understands to follow up with Charlies Arthur PA-C on 07/29/2024.  The patient understands discharge instructions? Yes  The patient understands medications and regimen? Yes   The patient reports she is bruised from the procedure and has aches that she uses PRN tylenol  for. The site itself is not draining. It is soft. Advised patient it will take time for this to improve. She will notify the office if the site changes in appearance.   Scheduled post procedure imaging on 08/14/2024.  The patient understands to call with any questions or concerns prior to scheduled visit.   The patient was grateful for call and agreed with plan.

## 2024-06-18 NOTE — Telephone Encounter (Signed)
 Last filled by patient on 08/24/2023 Last office visit :  05/11/24 Next office visit : due back 05/11/25 - Patient reports feeling affected by Requip  :   swimmy headed and dizzy.  She changed to prn use.

## 2024-06-22 ENCOUNTER — Telehealth: Payer: Self-pay

## 2024-06-22 NOTE — Telephone Encounter (Signed)
 The patient called and requested to cancel her upcoming visit with Dr. Kennyth. She reported she is doing substantially better and her bruising is remarkably improved.  She also reports her incision site is fully healed.  Reiterated to her to call if symptoms do not continue to heal. Confirmed 6 week visit with Charlies Arthur and reiterated importance of keeping that visit as scheduled. She was grateful for call and agreed with plan.

## 2024-06-25 ENCOUNTER — Other Ambulatory Visit: Payer: Self-pay | Admitting: Internal Medicine

## 2024-06-26 ENCOUNTER — Telehealth: Payer: Self-pay | Admitting: Cardiovascular Disease

## 2024-06-26 ENCOUNTER — Ambulatory Visit: Admitting: Cardiology

## 2024-06-26 MED ORDER — METOPROLOL SUCCINATE ER 50 MG PO TB24: 50.0000 mg | ORAL_TABLET | Freq: Two times a day (BID) | ORAL | 3 refills | Status: DC

## 2024-06-26 NOTE — Telephone Encounter (Signed)
 Pt c/o medication issue:  1. Name of Medication: diltiazem  (CARDIZEM  CD) 120 MG 24 hr capsule  metoprolol  succinate (TOPROL -XL) 50 MG 24 hr tablet  2. How are you currently taking this medication (dosage and times per day)? As written  3. Are you having a reaction (difficulty breathing--STAT)? No  4. What is your medication issue? Pt states that since dosage change on above medications she has experienced more heart racing as well as chest tightening at times. Pt requesting a c/b regarding this matter. Please advise

## 2024-06-26 NOTE — Telephone Encounter (Signed)
 Please increase the Metoprolol  back to 100 mg twice daily

## 2024-06-26 NOTE — Telephone Encounter (Signed)
 Spoke with pt who is reporting heart rate has been irregular and elevated off and on since her medication doses were decreased in October.  She had been taking Diltiazem  180 mg BID and is now taking 120 mg BID, Metoprolol  was 100 mg BID but is now 50 mg BID.  She reports feeling awful when heart rate if elevate.  Elevated heart rate can sometimes occur multiple times a day but then sometimes not for days.  She will continue current medication but it aware this information will be sent to MD for review and management.  HR has been anywhere from the 80 up to 111.  BP this afternoon was 138/72.

## 2024-06-26 NOTE — Telephone Encounter (Signed)
 Reviewed order with pt to increase Metoprolol  back to 100 mg twice daily.  She was appreciative of the call back and information.

## 2024-06-30 ENCOUNTER — Encounter: Payer: Self-pay | Admitting: Cardiovascular Disease

## 2024-06-30 ENCOUNTER — Ambulatory Visit: Payer: Self-pay | Admitting: Internal Medicine

## 2024-06-30 NOTE — Telephone Encounter (Signed)
 Tammy, should we try for a sooner appt?

## 2024-06-30 NOTE — Telephone Encounter (Signed)
 FYI Only or Action Required?: FYI only for provider: appointment scheduled on 07/03/2024.  Patient is followed in Pulmonology for COPD, last seen on 08/23/2022 by Neysa Reggy BIRCH, MD.  Called Nurse Triage reporting Shortness of Breath.  Symptoms began a week ago.  Interventions attempted: Rescue inhaler and Maintenance inhaler.  Symptoms are: unchanged.  Triage Disposition: See HCP Within 4 Hours (Or PCP Triage)  Patient/caregiver understands and will follow disposition?:   Copied from CRM [id number]. Topic: Clinical - Red Word Triage >> Jun 30, 2024  2:32 PM Rozanna MATSU wrote: Red Word that prompted transfer to Nurse Triage: tightness in chest and shortness of breath Reason for Disposition  [1] Longstanding difficulty breathing AND [2] not responding to usual therapy  Answer Assessment - Initial Assessment Questions Watchman placed a couple of weeks ago  1. RESPIRATORY STATUS: Describe your breathing? (e.g., wheezing, shortness of breath, unable to speak, severe coughing)      Shortness of breath with activity and chest tightness 2. ONSET: When did this breathing problem begin?      One week ago 3. PATTERN Does the difficult breathing come and go, or has it been constant since it started?      Worse than normal, and SOB with moderate activity.  Moves around her home without difficulty or SOB 4. SEVERITY: How bad is your breathing? (e.g., mild, moderate, severe)      mild 5. RECURRENT SYMPTOM: Have you had difficulty breathing before? If Yes, ask: When was the last time? and What happened that time?      Has happened in the past 6. CARDIAC HISTORY: Do you have any history of heart disease? (e.g., heart attack, angina, bypass surgery, angioplasty)      HTN 7. LUNG HISTORY: Do you have any history of lung disease?  (e.g., pulmonary embolus, asthma, emphysema)     COPD, asthma 8. CAUSE: What do you think is causing the breathing problem?      Unsure if weather  related or what the cause may be 9. OTHER SYMPTOMS: Do you have any other symptoms? (e.g., chest pain, cough, dizziness, fever, runny nose)     Denies fever 10. O2 SATURATION MONITOR:  Do you use an oxygen saturation monitor (pulse oximeter) at home? If Yes, ask: What is your reading (oxygen level) today? What is your usual oxygen saturation reading? (e.g., 95%)       93-94% at rest, normally 95-96%  12. TRAVEL: Have you traveled out of the country in the last month? (e.g., travel history, exposures)       denies  Protocols used: Breathing Difficulty-A-AH

## 2024-07-01 NOTE — Telephone Encounter (Signed)
 A video visit ws scheduled for patient on 11/21 at 3 pm, however this is not an appropriate appointment for a video visit.  I spoke with Tammy and she agreed to see the patient on 11/20 and she needs to be seen in person as she will likely need an cxr.  Nothing further needed.

## 2024-07-01 NOTE — Telephone Encounter (Signed)
 I called and spoke with patient's wife, Sharman (HAWAII), patient was sleeping at the time of my call.  I advised her that Tammy could see her at 11:30 tomorrow.  She asked if there was anything later because she is up all night.  I advised that is all we have the rest of the week.  She said to schedule it and she would talk to her when she wakes up.  I let her know she would need to be here by 11:15 for check in.  She verified understanding.  Nothing further needed.

## 2024-07-01 NOTE — Telephone Encounter (Signed)
 I can see tomorrow 07/02/24 , see if she can come in   Please contact office for sooner follow up if symptoms do not improve or worsen or seek emergency care

## 2024-07-02 ENCOUNTER — Ambulatory Visit: Admitting: Adult Health

## 2024-07-02 MED ORDER — TORSEMIDE 20 MG PO TABS: ORAL_TABLET | ORAL | 0 refills | Status: AC

## 2024-07-03 ENCOUNTER — Telehealth: Payer: Self-pay | Admitting: Cardiovascular Disease

## 2024-07-03 ENCOUNTER — Ambulatory Visit

## 2024-07-03 ENCOUNTER — Ambulatory Visit: Admitting: Adult Health

## 2024-07-03 VITALS — Ht 62.0 in | Wt 166.0 lb

## 2024-07-03 DIAGNOSIS — Z0000 Encounter for general adult medical examination without abnormal findings: Secondary | ICD-10-CM | POA: Diagnosis not present

## 2024-07-03 NOTE — Progress Notes (Signed)
 Chief Complaint  Patient presents with   Medicare Wellness     Subjective:  Please attest and cosign this visit due to patients primary care provider not being in the office at the time the visit was completed.  (Pt of Dr Almarie Cleveland)   [name] is a 73 y.o. female who presents for a Medicare Annual Wellness Visit.   I connected with  [name] on 07/03/24 by a audio enabled telemedicine application and verified that I am speaking with the correct person using two identifiers.  Patient Location: Home  Provider Location: Office/Clinic  Persons Participating in Visit: Patient.  I discussed the limitations of evaluation and management by telemedicine. The patient expressed understanding and agreed to proceed.  Vital Signs: Because this visit was a virtual/telehealth visit, some criteria may be missing or patient reported. Any vitals not documented were not able to be obtained and vitals that have been documented are patient reported.  Allergies (verified) Benadryl  [diphenhydramine  hcl], Clopidogrel bisulfate, Ioversol, Benadryl  [diphenhydramine ], and Iodinated contrast media   History: Past Medical History:  Diagnosis Date   Anemia, unspecified    Arthritis    Asthma    Atrial fibrillation with RVR (HCC)    a. on Xarelto    Bell's palsy    Facial nerve decompression in 2001   CHF (congestive heart failure) (HCC)    Chronic low back pain    COPD with asthma (HCC)    Coronary artery disease    Myoview  04/12/11 was entirely normal. ECHO 02/26/08 showed only minor abnormalities. Stenting 05/26/08 of her posterolateral branch to the left circumflex coronary artery. 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   27 mm device, JP   Shortness of breath dyspnea    ECHO 02/26/08 showed only minor abnormalities   Spinal stenosis    Past Surgical History:  Procedure Laterality Date   ABDOMINAL HYSTERECTOMY  1982   & BSO; for polycystic ovary disease   CARDIOVERSION N/A 12/17/2013   Procedure: CARDIOVERSION;  Surgeon: Vinie KYM Maxcy, MD;  Location: [facility] ENDOSCOPY;  Service: Cardiovascular;  Laterality: N/A;   CARDIOVERSION N/A 09/30/2015   Procedure: CARDIOVERSION;  Surgeon: Leim VEAR Moose, MD;  Location: [facility] ENDOSCOPY;  Service: Cardiovascular;  Laterality: N/A;   CARDIOVERSION N/A 06/22/2016   Procedure: CARDIOVERSION;  Surgeon: Annabella Scarce, MD;  Location: [facility] ENDOSCOPY;  Service: Cardiovascular;  Laterality: N/A;   CENTRAL LINE INSERTION  01/13/2019   Procedure: CENTRAL LINE INSERTION;  Surgeon: Cherrie Toribio SAUNDERS, MD;  Location: [facility] INVASIVE CV LAB;  Service: Cardiovascular;;   COLONOSCOPY     last 2009; Dr Donnald; due 2019   COLONOSCOPY WITH PROPOFOL  N/A 01/23/2018   Procedure: COLONOSCOPY WITH PROPOFOL ;  Surgeon: Donnald Charleston, MD;  Location: [facility] ENDOSCOPY;  Service: Endoscopy;  Laterality: N/A;   COLONOSCOPY WITH PROPOFOL  N/A 11/04/2020   Procedure: COLONOSCOPY  WITH PROPOFOL ;  Surgeon: Donnald Charleston, MD;  Location: [facility] ENDOSCOPY;  Service: Endoscopy;  Laterality: N/A;   CORONARY ANGIOPLASTY  05/26/2008   Stenting of her posterolateral branch to the left circumflex coronary artery. Used a 2.5x38mm Taxus Monorail stent.   ESOPHAGOGASTRODUODENOSCOPY (EGD) WITH PROPOFOL  N/A 01/23/2018   Procedure: ESOPHAGOGASTRODUODENOSCOPY (EGD) WITH PROPOFOL ;  Surgeon: Donnald Charleston, MD;  Location: [facility] ENDOSCOPY;  Service: Endoscopy;  Laterality: N/A;    ESOPHAGOGASTRODUODENOSCOPY (EGD) WITH PROPOFOL  N/A 11/04/2020   Procedure: ESOPHAGOGASTRODUODENOSCOPY (EGD) WITH PROPOFOL ;  Surgeon: Donnald Charleston, MD;  Location: [facility] ENDOSCOPY;  Service: Endoscopy;  Laterality: N/A;   FACIAL NERVE DECOMPRESSION  2001/2002   bells palsy    GIVENS CAPSULE STUDY N/A 11/04/2020   Procedure: GIVENS CAPSULE STUDY;  Surgeon: Donnald Charleston, MD;  Location: [facility] ENDOSCOPY;  Service: Endoscopy;  Laterality: N/A;   HOT HEMOSTASIS N/A 11/04/2020   Procedure: HOT HEMOSTASIS (ARGON PLASMA COAGULATION/BICAP);  Surgeon: Donnald Charleston, MD;  Location: [facility] ENDOSCOPY;  Service: Endoscopy;  Laterality: N/A;   LAPAROSCOPIC CHOLECYSTECTOMY  06/15/2011    Dr Gail   LEFT ATRIAL APPENDAGE OCCLUSION N/A 06/11/2024   Procedure: LEFT ATRIAL APPENDAGE OCCLUSION;  Surgeon: Kennyth Chew, MD;  Location: [facility] INVASIVE CV LAB;  Service: Cardiovascular;  Laterality: N/A;   PERICARDIOCENTESIS N/A 01/13/2019   Procedure: PERICARDIOCENTESIS;  Surgeon: Verlin Lonni BIRCH, MD;  Location: [facility] INVASIVE CV LAB;  Service: Cardiovascular;  Laterality: N/A;   POLYPECTOMY  11/04/2020   Procedure: POLYPECTOMY;  Surgeon: Donnald Charleston, MD;  Location: [facility] ENDOSCOPY;  Service: Endoscopy;;   RIGHT AND LEFT HEART CATH N/A 01/13/2019   Procedure: RIGHT AND LEFT HEART CATH;  Surgeon: Cherrie Toribio SAUNDERS, MD;  Location: [facility] INVASIVE CV LAB;  Service: Cardiovascular;  Laterality: N/A;   TEE WITHOUT CARDIOVERSION N/A 12/17/2013   Procedure: TRANSESOPHAGEAL ECHOCARDIOGRAM (TEE);  Surgeon: Vinie KYM Maxcy, MD;  Location: [facility] ENDOSCOPY;  Service: Cardiovascular;  Laterality: N/A;  trish/ja   TOTAL HIP ARTHROPLASTY Left 09/25/2021   Procedure: LEFT TOTAL HIP ARTHROPLASTY ANTERIOR APPROACH;  Surgeon: Liam Lerner, MD;  Location: [facility] ORS;  Service: Orthopedics;  Laterality: Left;   TRANSESOPHAGEAL ECHOCARDIOGRAM (CATH LAB) N/A 05/01/2024   Procedure: TRANSESOPHAGEAL ECHOCARDIOGRAM;  Surgeon: Francyne Headland, MD;  Location: [facility]  INVASIVE CV LAB;  Service: Cardiovascular;  Laterality: N/A;   TRANSESOPHAGEAL ECHOCARDIOGRAM (CATH LAB) N/A 06/11/2024   Procedure: TRANSESOPHAGEAL ECHOCARDIOGRAM;  Surgeon: Kennyth Chew, MD;  Location: [facility] INVASIVE CV LAB;  Service: Cardiovascular;  Laterality: N/A;   TRANSTHORACIC ECHOCARDIOGRAM  02/26/2008   LV systolic function normal with mild conc LVH; LA mildly dilated; trace MR/TR   UPPER GI ENDOSCOPY  2009   negative   Family History  Problem Relation Age of Onset   Heart attack Father 37       2nd MI at 79   Colon cancer Brother 74   Gout Brother    Ulcers Mother    Emphysema Mother 74   Colon polyps Sister    Cancer Sister        Basal cell carcinoma   Pneumonia Maternal Grandmother    Hypertension Brother    Hyperlipidemia Brother    Cancer Brother        Skin   Diabetes Neg Hx    Stroke Neg Hx    Social History   Occupational History   Occupation: Doctor, General Practice    Employer: [name]  Tobacco Use   Smoking status: Former    Current packs/day: 0.00    Average packs/day: 1.5  packs/day for 30.0 years (45.0 ttl pk-yrs)    Types: Cigarettes    Start date: 08/14/1963    Quit date: 08/13/1993    Years since quitting: 30.9   Smokeless tobacco: Never  Vaping Use   Vaping status: Never Used  Substance and Sexual Activity   Alcohol use: No   Drug use: No   Sexual activity: Not Currently   Tobacco Counseling Counseling given: Not Answered  SDOH Screenings   Food Insecurity: No Food Insecurity (07/03/2024)  Recent Concern: Food Insecurity - Food Insecurity Present (05/12/2024)  Housing: Unknown (07/03/2024)  Transportation Needs: No Transportation Needs (07/03/2024)  Utilities: Not At Risk (07/03/2024)  Depression (PHQ2-9): Low Risk  (07/03/2024)  Physical Activity: Insufficiently Active (07/03/2024)  Social Connections: Moderately Isolated (07/03/2024)  Stress: No Stress Concern Present (07/03/2024)  Tobacco Use: Medium Risk (07/03/2024)   Health Literacy: Adequate Health Literacy (07/03/2024)   See flowsheets for full screening details  Depression Screen PHQ 2 & 9 Depression Scale- Over the past 2 weeks, how often have you been bothered by any of the following problems? Little interest or pleasure in doing things: 0 Feeling down, depressed, or hopeless (PHQ Adolescent also includes...irritable): 0 PHQ-2 Total Score: 0 Trouble falling or staying asleep, or sleeping too much: 0 Feeling tired or having little energy: 0 Poor appetite or overeating (PHQ Adolescent also includes...weight loss): 0 Feeling bad about yourself - or that you are a failure or have let yourself or your family down: 0 Trouble concentrating on things, such as reading the newspaper or watching television (PHQ Adolescent also includes...like school work): 0 Moving or speaking so slowly that other people could have noticed. Or the opposite - being so fidgety or restless that you have been moving around a lot more than usual: 0 Thoughts that you would be better off dead, or of hurting yourself in some way: 0 PHQ-9 Total Score: 0 If you checked off any problems, how difficult have these problems made it for you to do your work, take care of things at home, or get along with other people?: Not difficult at all  Depression Treatment Depression Interventions/Treatment : Counseling     Goals Addressed               This Visit's Progress     Patient Stated (pt-stated)        Patient stated she plans to continue doing physical therapy - strengthening       Visit info / Clinical Intake: Medicare Wellness Visit Type:: Subsequent Annual Wellness Visit Persons participating in visit:: patient Medicare Wellness Visit Mode:: Telephone If telephone:: video declined Because this visit was a virtual/telehealth visit:: vitals recorded from last visit If Telephone or Video please confirm:: I connected with the patient using audio enabled telemedicine  application and verified that I am speaking with the correct person using two identifiers; I discussed the limitations of evaluation and management by telemedicine; The patient expressed understanding and agreed to proceed Patient Location:: Home Provider Location:: Office Information given by:: patient Interpreter Needed?: No Pre-visit prep was completed: yes AWV questionnaire completed by patient prior to visit?: no Living arrangements:: lives with spouse/significant other Patient's Overall Health Status Rating: good Typical amount of pain: none Does pain affect daily life?: no Are you currently prescribed opioids?: (!) yes  Dietary Habits and Nutritional Risks How many meals a day?: 2 Eats fruit and vegetables daily?: yes Most meals are obtained by: preparing own meals In the last 2 weeks, have you had  any of the following?: none Diabetic:: (!) yes Any non-healing wounds?: no How often do you check your BS?: 1 Would you like to be referred to a Nutritionist or for Diabetic Management? : no  Functional Status Activities of Daily Living (to include ambulation/medication): Independent Ambulation: Independent with device- listed below Home Assistive Devices/Equipment: Eyeglasses; Dentures (specify type); Walker (specify Type) Medication Administration: Independent Home Management: Independent Manage your own finances?: yes Primary transportation is: family/friends Concerns about vision?: no *vision screening is required for WTM* Concerns about hearing?: no  Fall Screening Falls in the past year?: 1 Number of falls in past year: 1 (1) Was there an injury with Fall?: 0 Fall Risk Category Calculator: 2 Patient Fall Risk Level: Moderate Fall Risk  Fall Risk Patient at Risk for Falls Due to: Impaired balance/gait; History of fall(s) Fall risk Follow up: Falls evaluation completed; Falls prevention discussed  Home and Transportation Safety: All rugs have non-skid backing?:  yes All stairs or steps have railings?: yes Grab bars in the bathtub or shower?: yes (shower chair) Have non-skid surface in bathtub or shower?: yes Good home lighting?: yes Regular seat belt use?: yes Hospital stays in the last year:: (!) yes How many hospital stays:: 2 Reason: stent in leg  Cognitive Assessment Difficulty concentrating, remembering, or making decisions? : no Will 6CIT or Mini Cog be Completed: yes What year is it?: 0 points What month is it?: 0 points Give patient an address phrase to remember (5 components): [address] [place], [place] About what time is it?: 0 points Count backwards from 20 to 1: 0 points Say the months of the year in reverse: 0 points Repeat the address phrase from earlier: 0 points 6 CIT Score: 0 points  Advance Directives (For Healthcare) Does Patient Have a Medical Advance Directive?: Yes Does patient want to make changes to medical advance directive?: Yes (Inpatient - patient requests chaplain consult to change a medical advance directive) Type of Advance Directive: Healthcare Power of [place]; Living will Copy of Healthcare Power of Attorney in Chart?: Yes - validated most recent copy scanned in chart (See row information) Copy of Living Will in Chart?: Yes - validated most recent copy scanned in chart (See row information)  Reviewed/Updated  Reviewed/Updated: Reviewed All (Medical, Surgical, Family, Medications, Allergies, Care Teams, Patient Goals)        Objective:    Today's Vitals   07/03/24 1547  Weight: 166 lb (75.3 kg)  Height: 5' 2 (1.575 m)   Body mass index is 30.36 kg/m.  Current Medications (verified) Outpatient Encounter Medications as of 07/03/2024  Medication Sig   Accu-Chek Softclix Lancets lancets    acetaminophen  (TYLENOL ) 325 MG tablet Take 2 tablets (650 mg total) by mouth every 4 (four) hours as needed for headache or mild pain (pain score 1-3).   albuterol  (VENTOLIN  HFA) 108 (90 Base) MCG/ACT  inhaler Inhale 2 puffs into the lungs every 6 (six) hours as needed for wheezing or shortness of breath.   Apoaequorin (PREVAGEN EXTRA STRENGTH) 20 MG CAPS Take 20 mg by mouth daily.   ASCORBIC ACID  PO Take 1 tablet by mouth at bedtime.   benzonatate  (TESSALON ) 200 MG capsule Take 1 capsule (200 mg total) by mouth 3 (three) times daily as needed. (Patient taking differently: Take 200 mg by mouth 2 (two) times daily.)   Camphor-Menthol -Methyl Sal (SALONPAS EX) Apply 1 patch topically daily as needed.   Cholecalciferol (VITAMIN D3 PO) Take 1 capsule by mouth at bedtime.  diltiazem  (CARDIZEM  CD) 120 MG 24 hr capsule Take 1 capsule (120 mg total) by mouth daily.   ELIQUIS  5 MG TABS tablet TAKE ONE TABLET BY MOUTH TWICE DAILY   famotidine  (PEPCID ) 20 MG tablet Take 1 tablet (20 mg total) by mouth 2 (two) times daily.   gabapentin  (NEURONTIN ) 100 MG capsule Take 100 mg by mouth 3 (three) times daily as needed.   glucose blood (FREESTYLE LITE) test strip Use as instructed   HYDROcodone -acetaminophen  (NORCO/VICODIN) 5-325 MG tablet Take 1 tablet by mouth daily as needed for moderate pain (pain score 4-6) (This is a 30 day supply). (Patient taking differently: Take 0.5 tablets by mouth at bedtime.)   latanoprost  (XALATAN ) 0.005 % ophthalmic solution Place 1 drop into both eyes at bedtime.   LORazepam  (ATIVAN ) 0.5 MG tablet Take 1 tablet (0.5 mg total) by mouth 2 (two) times daily. (Patient taking differently: Take 1 mg by mouth at bedtime.)   magnesium  oxide (MAG-OX) 400 MG tablet Take 1 tablet (400 mg total) by mouth daily. (Patient taking differently: Take 400 mg by mouth at bedtime.)   metoprolol  succinate (TOPROL -XL) 50 MG 24 hr tablet Take 1 tablet (50 mg total) by mouth 2 (two) times daily. Take with or immediately following a meal.   nystatin  (MYCOSTATIN /NYSTOP ) powder Apply topically once a week. (Patient taking differently: Apply 1 Application topically See admin instructions. 3 times a week)    PARoxetine  (PAXIL ) 10 MG tablet Take 2.5 tablets (25 mg total) by mouth daily. (Patient taking differently: Take 25 mg by mouth every morning.)   predniSONE  (DELTASONE ) 50 MG tablet Take Prednisone  1 tablet (50 mg) by mouth on 10/29 at 6:30PM. Take the second tablet on 10/30 at 12:30AM.   rOPINIRole  (REQUIP ) 0.5 MG tablet Take 1 tablet (0.5 mg total) by mouth at bedtime.   rosuvastatin  (CRESTOR ) 10 MG tablet Take 1 tablet (10 mg total) by mouth at bedtime.   senna (SENOKOT) 8.6 MG TABS Take 1 tablet by mouth at bedtime as needed for mild constipation.   sitaGLIPtin -metformin  (JANUMET ) 50-1000 MG tablet Take 1 tablet by mouth 2 (two) times daily with a meal.   torsemide  (DEMADEX ) 20 MG tablet Take 40 mg BID alternating with 40mg  once daily   triamcinolone  cream (KENALOG ) 0.1 % Apply 1 Application topically 2 (two) times daily. (Patient taking differently: Apply 1 Application topically at bedtime as needed.)   umeclidinium-vilanterol (ANORO ELLIPTA ) 62.5-25 MCG/ACT AEPB Inhale 1 puff into the lungs daily. (Patient taking differently: Inhale 1 puff into the lungs daily as needed (Breathing).)   lisinopril  (ZESTRIL ) 10 MG tablet Take 1 tablet (10 mg total) by mouth daily. (Patient taking differently: Take 10 mg by mouth at bedtime.)   Multiple Minerals-Vitamins (CALCIUM  CITRATE PLUS/MAGNESIUM  PO) Take 1 tablet by mouth daily.   nystatin  ointment (MYCOSTATIN ) Apply 1 Application topically 2 (two) times daily as needed (IRRITATION). (Patient taking differently: Apply 1 Application topically daily as needed (IRRITATION).)   traZODone  (DESYREL ) 100 MG tablet take UP TO 2 TABS (200 MG) BY MOUTH AT bedtime FOR SLEEP (Patient taking differently: Take 150 mg by mouth at bedtime. Rx written to allow patient to take up to 2 tabs at bedtime, pt states she takes 1.5 tabs often)   No facility-administered encounter medications on file as of 07/03/2024.   Hearing/Vision screen Hearing Screening - Comments:: Denies  hearing difficulties   Vision Screening - Comments:: Wears rx glasses - up to date with routine eye exams with [facility] Immunizations and Health  Maintenance Health Maintenance  Topic Date Due   Diabetic kidney evaluation - Urine ACR  01/22/2009   Pneumococcal Vaccine: 50+ Years (2 of 2 - PPSV23, PCV20, or PCV21) 08/04/2018   COVID-19 Vaccine (5 - 2025-26 season) 04/13/2024   OPHTHALMOLOGY EXAM  11/10/2024   HEMOGLOBIN A1C  12/10/2024   Diabetic kidney evaluation - eGFR measurement  06/08/2025   FOOT EXAM  06/08/2025   Medicare Annual Wellness (AWV)  07/03/2025   Colonoscopy  11/05/2030   DTaP/Tdap/Td (2 - Td or Tdap) 11/26/2033   Influenza Vaccine  Completed   Hepatitis C Screening  Completed   Bone Density Scan  Addressed   Meningococcal B Vaccine  Aged Out   Mammogram  Discontinued   Zoster Vaccines- Shingrix  Discontinued        Assessment/Plan:  This is a routine wellness examination for [facility].  Patient Care Team: Rollene Almarie LABOR, MD as PCP - General (Internal Medicine) Francyne Headland, MD as PCP - Cardiology (Cardiology) Kennyth Chew, MD as PCP - Electrophysiology (Cardiology) Rollene Almarie LABOR, MD (Internal Medicine) Leslee Reusing, MD as Consulting Physician (Ophthalmology)  I have personally reviewed and noted the following in the patient's chart:   Medical and social history Use of alcohol, tobacco or illicit drugs  Current medications and supplements including opioid prescriptions. Functional ability and status Nutritional status Physical activity Advanced directives List of other physicians Hospitalizations, surgeries, and ER visits in previous 12 months Vitals Screenings to include cognitive, depression, and falls Referrals and appointments  No orders of the defined types were placed in this encounter.  In addition, I have reviewed and discussed with patient certain preventive protocols, quality metrics, and best practice  recommendations. A written personalized care plan for preventive services as well as general preventive health recommendations were provided to patient.   [name], CMA   07/03/2024   Return in 1 year (on 07/03/2025).  After Visit Summary: (MyChart) Due to this being a telephonic visit, the after visit summary with patients personalized plan was offered to patient via MyChart   Nurse Notes: scheduled 6-mth Diabetes f/u appt w/PCP

## 2024-07-03 NOTE — Telephone Encounter (Signed)
 Pt called in and stated she needs a note stating it is ok for her to start back PT.   Pt stated she would like to pick it up when it is ready   Best number [phone]

## 2024-07-03 NOTE — Patient Instructions (Addendum)
 [name],  Thank you for taking the time for your Medicare Wellness Visit. I appreciate your continued commitment to your health goals. Please review the care plan we discussed, and feel free to reach out if I can assist you further.  Please note that Annual Wellness Visits do not include a physical exam. Some assessments may be limited, especially if the visit was conducted virtually. If needed, we may recommend an in-person follow-up with your provider.  Ongoing Care Seeing your primary care provider every 3 to 6 months helps us  monitor your health and provide consistent, personalized care.   Referrals If a referral was made during today's visit and you haven't received any updates within two weeks, please contact the referred provider directly to check on the status.  Recommended Screenings:  Health Maintenance  Topic Date Due   Yearly kidney health urinalysis for diabetes  01/22/2009   Pneumococcal Vaccine for age over 67 (2 of 2 - PPSV23, PCV20, or PCV21) 08/04/2018   COVID-19 Vaccine (5 - 2025-26 season) 04/13/2024   Eye exam for diabetics  11/10/2024   Hemoglobin A1C  12/10/2024   Yearly kidney function blood test for diabetes  06/08/2025   Complete foot exam   06/08/2025   Medicare Annual Wellness Visit  07/03/2025   Colon Cancer Screening  11/05/2030   DTaP/Tdap/Td vaccine (2 - Td or Tdap) 11/26/2033   Flu Shot  Completed   Hepatitis C Screening  Completed   Osteoporosis screening with Bone Density Scan  Addressed   Meningitis B Vaccine  Aged Out   Breast Cancer Screening  Discontinued   Zoster (Shingles) Vaccine  Discontinued       07/03/2024    3:51 PM  Advanced Directives  Does Patient Have a Medical Advance Directive? Yes  Type of Estate Agent of [place];Living will  Does patient want to make changes to medical advance directive? Yes (Inpatient - patient requests chaplain consult to change a medical advance directive)  Copy of Healthcare  Power of Attorney in Chart? Yes - validated most recent copy scanned in chart (See row information)    Vision: Annual vision screenings are recommended for early detection of glaucoma, cataracts, and diabetic retinopathy. These exams can also reveal signs of chronic conditions such as diabetes and high blood pressure.  Dental: Annual dental screenings help detect early signs of oral cancer, gum disease, and other conditions linked to overall health, including heart disease and diabetes.

## 2024-07-03 NOTE — Telephone Encounter (Signed)
 Called and spoke to pt's wife, Sharman. The pt needs a letter/note to RESUME PT. She had been participating in PT two times weekly and this was stopped due to Watchman procedure (performed by Dr. Kennyth on 06/11/24).   Needs the letter asap; okay to send in a MyChart message or call pt so she can come to office and pick it up.  PT needed for Core Strength and Balance, per Sharman.

## 2024-07-07 ENCOUNTER — Ambulatory Visit

## 2024-07-07 VITALS — BP 156/61 | HR 97 | Ht 62.0 in | Wt 169.0 lb

## 2024-07-07 DIAGNOSIS — J4489 Other specified chronic obstructive pulmonary disease: Secondary | ICD-10-CM | POA: Diagnosis not present

## 2024-07-07 DIAGNOSIS — J439 Emphysema, unspecified: Secondary | ICD-10-CM

## 2024-07-07 DIAGNOSIS — G4734 Idiopathic sleep related nonobstructive alveolar hypoventilation: Secondary | ICD-10-CM

## 2024-07-07 DIAGNOSIS — I4891 Unspecified atrial fibrillation: Secondary | ICD-10-CM

## 2024-07-07 DIAGNOSIS — J4520 Mild intermittent asthma, uncomplicated: Secondary | ICD-10-CM

## 2024-07-07 DIAGNOSIS — G2581 Restless legs syndrome: Secondary | ICD-10-CM

## 2024-07-07 DIAGNOSIS — G4761 Periodic limb movement disorder: Secondary | ICD-10-CM

## 2024-07-07 DIAGNOSIS — Z87891 Personal history of nicotine dependence: Secondary | ICD-10-CM

## 2024-07-07 DIAGNOSIS — J4530 Mild persistent asthma, uncomplicated: Secondary | ICD-10-CM

## 2024-07-07 MED ORDER — BUDESONIDE-FORMOTEROL FUMARATE 160-4.5 MCG/ACT IN AERO: 2.0000 | INHALATION_SPRAY | Freq: Two times a day (BID) | RESPIRATORY_TRACT | 6 refills | Status: DC

## 2024-07-07 MED ORDER — GABAPENTIN 100 MG PO CAPS: 200.0000 mg | ORAL_CAPSULE | Freq: Every day | ORAL | 3 refills | Status: AC

## 2024-07-07 MED ORDER — ALBUTEROL SULFATE HFA 108 (90 BASE) MCG/ACT IN AERS: 2.0000 | INHALATION_SPRAY | Freq: Four times a day (QID) | RESPIRATORY_TRACT | 0 refills | Status: AC | PRN

## 2024-07-07 NOTE — Progress Notes (Signed)
 Pulmonology Office Visit   Subjective:  Patient ID: [name], female    DOB: [birth year]  MRN: [id number]  Referred by: Rollene Almarie LABOR, *  CC:  Chief Complaint  Patient presents with   COPD    F/U Pt states she has been having more SOB since Fall of this year, mostly with exertion. Pt states she has very little coughing with clear phlegm.     HPI [name] is a 73 y.o. female former smoker with with COPD, DM 2, A-fib, CHF, CAD, RLS, depression and anxiety obesity class I following Dr. Neysa for COPD.  Last seen June 2024. Hospital admission November 2025: Underwent placement of Watchman device.  Respective notes from provider reviewed as appropriate to gather relevant information for patient care.   Discussed the use of AI scribe software for clinical note transcription with the patient, who gave verbal consent to proceed.  History of Present Illness   [name] is a 73 year old female with COPD who presents for follow-up. She was previously under the care of Dr. Neysa, who has retired.  Her COPD symptoms worsen in the fall, with this year being particularly severe. She experiences chest tightness during this season. Currently, there is no cough or phlegm production, but she notes increased tightness and difficulty moving around due to mucus at night. She can walk 50 to 75 feet on a flat surface before becoming short of breath. She uses Anoro Ellipta  daily and has an outdated albuterol  inhaler. She smoked a pack a day from age 16 to 35 and quit 30 years ago. She has not received the COVID vaccine this year but had a flu vaccine at the hospital and a Prevnar 13 in 2019.  She has significant periodic limb movements in sleep and likely restless legs syndrome, which bothers her at night. She takes a small dose of ropinirole  for it. She is anemic with a ferritin level of 13 and a percent saturation of 5. She received two Ferraheme infusions in October 2025, which  improved her symptoms for three to four weeks. She does not take gabapentin  due to side effects of feeling 'not balanced'.  Her family history includes asthma in her mother and aunts. 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 She has one cat at home and no significant exposures at work. She prefers afternoon appointments due to her late-night work schedule.      COPD Hx: On Anoro Ellipta .  Only taking as needed. Worse in fall.  Triggered by perfumes and smokes, animal hair and dust.  Mom and Maternal aunts with asthma.   PLMS: On Neurontin  and Requip .  Also on Paxil . Feraheme  10/8 and 05/27/24.   Lung Health: Functional status: 50-10ft.  Covid vaccine: UTD but did not have it this year. Will get throug PCP.  Influenza vaccine: UTD.  Pneumonococcal vaccine: will get it through PCP.  Smoking: 1 pkx 27 quit at age 85.  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 No exposures. 1 cat.   PRIOR TESTS and IMAGING: Echo 05/2022: EF 60-65%, mild reduction RV function. NPSG 08/15/2017- AHI 0/ hr. desat to 89%/ mean 93%, PLMA 23.7/ hr, body weight 194 lbs ONOX 89% or less  For over 17 minutes ([employer]) Nocturnal oximetry 2021: Desaturation for 4.5 minutes on room air. PFT 09/30/20--Minimal obstruction (refused bronchodilator trial), possible min restriction, DLCO mild to mod reduction Chest x-ray May 2025 reviewed by me shows unremarkable lung  parenchyma. CT chest 2020 reviewed by me shows some emphysematous changes with left greater than right pleural effusion.  Labs  Eos abs 400 in past.  Sept 2025, ferritin 13, % sat 5.    Allergies: Benadryl  [diphenhydramine  hcl], Clopidogrel bisulfate, Ioversol, Benadryl  [diphenhydramine ], and Iodinated contrast media  Current Outpatient Medications:    Accu-Chek Softclix Lancets lancets, , Disp: 102 each, Rfl: 10   acetaminophen  (TYLENOL ) 325 MG tablet, Take 2 tablets (650 mg total) by mouth  every 4 (four) hours as needed for headache or mild pain (pain score 1-3)., Disp: , Rfl:    Apoaequorin (PREVAGEN EXTRA STRENGTH) 20 MG CAPS, Take 20 mg by mouth daily., Disp: , Rfl:    ASCORBIC ACID  PO, Take 1 tablet by mouth at bedtime., Disp: , Rfl:    benzonatate  (TESSALON ) 200 MG capsule, Take 1 capsule (200 mg total) by mouth 3 (three) times daily as needed., Disp: 60 capsule, Rfl: 3   budesonide -formoterol  (SYMBICORT ) 160-4.5 MCG/ACT inhaler, Inhale 2 puffs into the lungs 2 (two) times daily., Disp: 1 each, Rfl: 6   Camphor-Menthol -Methyl Sal (SALONPAS EX), Apply 1 patch topically daily as needed., Disp: , Rfl:    Cholecalciferol (VITAMIN D3 PO), Take 1 capsule by mouth at bedtime., Disp: , Rfl:    diltiazem  (CARDIZEM  CD) 120 MG 24 hr capsule, Take 1 capsule (120 mg total) by mouth daily., Disp: 90 capsule, Rfl: 1   ELIQUIS  5 MG TABS tablet, TAKE ONE TABLET BY MOUTH TWICE DAILY, Disp: 60 tablet, Rfl: 5   famotidine  (PEPCID ) 20 MG tablet, Take 1 tablet (20 mg total) by mouth 2 (two) times daily., Disp: 180 tablet, Rfl: 1   gabapentin  (NEURONTIN ) 100 MG capsule, Take 2 capsules (200 mg total) by mouth at bedtime., Disp: 60 capsule, Rfl: 3   glucose blood (FREESTYLE LITE) test strip, Use as instructed, Disp: 100 each, Rfl: 12   HYDROcodone -acetaminophen  (NORCO/VICODIN) 5-325 MG tablet, Take 1 tablet by mouth daily as needed for moderate pain (pain score 4-6) (This is a 30 day supply)., Disp: 30 tablet, Rfl: 0   latanoprost  (XALATAN ) 0.005 % ophthalmic solution, Place 1 drop into both eyes at bedtime., Disp: , Rfl:    lisinopril  (ZESTRIL ) 10 MG tablet, Take 1 tablet (10 mg total) by mouth daily., Disp: 90 tablet, Rfl: 3   LORazepam  (ATIVAN ) 0.5 MG tablet, Take 1 tablet (0.5 mg total) by mouth 2 (two) times daily., Disp: 60 tablet, Rfl: 5   magnesium  oxide (MAG-OX) 400 MG tablet, Take 1 tablet (400 mg total) by mouth daily., Disp: 90 tablet, Rfl: 3   metoprolol  succinate (TOPROL -XL) 50 MG 24 hr  tablet, Take 1 tablet (50 mg total) by mouth 2 (two) times daily. Take with or immediately following a meal., Disp: 180 tablet, Rfl: 3   Multiple Minerals-Vitamins (CALCIUM  CITRATE PLUS/MAGNESIUM  PO), Take 1 tablet by mouth daily., Disp: , Rfl:    nystatin  (MYCOSTATIN /NYSTOP ) powder, Apply topically once a week., Disp: 60 g, Rfl: 5   nystatin  ointment (MYCOSTATIN ), Apply 1 Application topically 2 (two) times daily as needed (IRRITATION)., Disp: 100 g, Rfl: 6   PARoxetine  (PAXIL ) 10 MG tablet, Take 2.5 tablets (25 mg total) by mouth daily., Disp: 225 tablet, Rfl: 3   rOPINIRole  (REQUIP ) 0.5 MG tablet, Take 1 tablet (0.5 mg total) by mouth at bedtime., Disp: 90 tablet, Rfl: 3   rosuvastatin  (CRESTOR ) 10 MG tablet, Take 1 tablet (10 mg total) by mouth at bedtime., Disp: 90 tablet, Rfl: 3   senna (SENOKOT)  8.6 MG TABS, Take 1 tablet by mouth at bedtime as needed for mild constipation., Disp: , Rfl:    sitaGLIPtin -metformin  (JANUMET ) 50-1000 MG tablet, Take 1 tablet by mouth 2 (two) times daily with a meal., Disp: 180 tablet, Rfl: 0   torsemide  (DEMADEX ) 20 MG tablet, Take 40 mg BID alternating with 40mg  once daily, Disp: 270 tablet, Rfl: 0   traZODone  (DESYREL ) 100 MG tablet, take UP TO 2 TABS (200 MG) BY MOUTH AT bedtime FOR SLEEP, Disp: 180 tablet, Rfl: 1   triamcinolone  cream (KENALOG ) 0.1 %, Apply 1 Application topically 2 (two) times daily., Disp: 100 g, Rfl: 0   albuterol  (VENTOLIN  HFA) 108 (90 Base) MCG/ACT inhaler, Inhale 2 puffs into the lungs every 6 (six) hours as needed for wheezing or shortness of breath., Disp: 8 g, Rfl: 0   predniSONE  (DELTASONE ) 50 MG tablet, Take Prednisone  1 tablet (50 mg) by mouth on 10/29 at 6:30PM. Take the second tablet on 10/30 at 12:30AM. (Patient not taking: Reported on 07/07/2024), Disp: 2 tablet, Rfl: 0 Past Medical History:  Diagnosis Date   Anemia, unspecified    Arthritis    Asthma    Atrial fibrillation with RVR (HCC)    a. on Xarelto    Bell's palsy     Facial nerve decompression in 2001   CHF (congestive heart failure) (HCC)    Chronic low back pain    COPD with asthma (HCC)    Coronary artery disease    Myoview  04/12/11 was entirely normal. ECHO 02/26/08 showed only minor abnormalities. Stenting 05/26/08 of her posterolateral branch to the left circumflex coronary artery. Used a 2.5x63mm Taxus Monorail stent.myoview  2014 was without ischemia   Diabetes mellitus    Type 2   Early cataracts, bilateral    Fatty liver    GERD (gastroesophageal reflux disease)    Glaucoma    Glaucoma    Goiter    Heart murmur    History of nuclear stress test 2012; 2014   lexiscan ; normal pattern of perfusion; normal, low risk scan    Hyperlipidemia    Hypertension    Panic disorder    Pneumonia 2008   Polycystic ovary disease    Hysterectomy in 1982 for this   Presence of Watchman left atrial appendage closure device 06/11/2024   27 mm device, JP   Shortness of breath dyspnea    ECHO 02/26/08 showed only minor abnormalities   Spinal stenosis    Past Surgical History:  Procedure Laterality Date   ABDOMINAL HYSTERECTOMY  1982   & BSO; for polycystic ovary disease   CARDIOVERSION N/A 12/17/2013   Procedure: CARDIOVERSION;  Surgeon: Vinie KYM Maxcy, MD;  Location: [facility] ENDOSCOPY;  Service: Cardiovascular;  Laterality: N/A;   CARDIOVERSION N/A 09/30/2015   Procedure: CARDIOVERSION;  Surgeon: Leim VEAR Moose, MD;  Location: [facility] ENDOSCOPY;  Service: Cardiovascular;  Laterality: N/A;   CARDIOVERSION N/A 06/22/2016   Procedure: CARDIOVERSION;  Surgeon: Annabella Scarce, MD;  Location: [facility] ENDOSCOPY;  Service: Cardiovascular;  Laterality: N/A;   CENTRAL LINE INSERTION  01/13/2019   Procedure: CENTRAL LINE INSERTION;  Surgeon: Cherrie Toribio SAUNDERS, MD;  Location: [facility] INVASIVE CV LAB;  Service: Cardiovascular;;   COLONOSCOPY     last 2009; Dr Donnald; due 2019   COLONOSCOPY WITH PROPOFOL  N/A 01/23/2018   Procedure: COLONOSCOPY WITH PROPOFOL ;  Surgeon: Donnald Charleston, MD;  Location: [facility] ENDOSCOPY;  Service: Endoscopy;  Laterality: N/A;   COLONOSCOPY WITH PROPOFOL  N/A 11/04/2020   Procedure: COLONOSCOPY WITH PROPOFOL ;  Surgeon: Donnald Charleston, MD;  Location: [facility] ENDOSCOPY;  Service: Endoscopy;  Laterality: N/A;   CORONARY ANGIOPLASTY  05/26/2008   Stenting of her posterolateral branch to the left circumflex coronary artery. Used a 2.5x84mm Taxus Monorail stent.   ESOPHAGOGASTRODUODENOSCOPY (EGD) WITH PROPOFOL  N/A 01/23/2018   Procedure: ESOPHAGOGASTRODUODENOSCOPY (EGD) WITH PROPOFOL ;  Surgeon: Donnald Charleston, MD;  Location: [facility] ENDOSCOPY;  Service: Endoscopy;  Laterality: N/A;   ESOPHAGOGASTRODUODENOSCOPY (EGD) WITH PROPOFOL  N/A 11/04/2020   Procedure: ESOPHAGOGASTRODUODENOSCOPY (EGD) WITH PROPOFOL ;  Surgeon: Donnald Charleston, MD;  Location: [facility] ENDOSCOPY;  Service: Endoscopy;  Laterality: N/A;   FACIAL NERVE DECOMPRESSION  2001/2002   bells palsy    GIVENS CAPSULE STUDY N/A 11/04/2020   Procedure: GIVENS CAPSULE STUDY;  Surgeon: Donnald Charleston, MD;  Location: [facility] ENDOSCOPY;  Service: Endoscopy;  Laterality: N/A;   HOT HEMOSTASIS N/A 11/04/2020   Procedure: HOT HEMOSTASIS (ARGON PLASMA COAGULATION/BICAP);  Surgeon: Donnald Charleston, MD;  Location: [facility] ENDOSCOPY;  Service: Endoscopy;  Laterality: N/A;   LAPAROSCOPIC CHOLECYSTECTOMY  06/15/2011    Dr Gail   LEFT ATRIAL APPENDAGE OCCLUSION N/A 06/11/2024   Procedure: LEFT ATRIAL APPENDAGE OCCLUSION;  Surgeon: Kennyth Chew, MD;  Location: [facility] INVASIVE CV LAB;  Service: Cardiovascular;  Laterality: N/A;   PERICARDIOCENTESIS N/A 01/13/2019   Procedure: PERICARDIOCENTESIS;  Surgeon: Verlin Lonni BIRCH, MD;  Location: [facility] INVASIVE CV LAB;  Service: Cardiovascular;  Laterality: N/A;   POLYPECTOMY  11/04/2020   Procedure: POLYPECTOMY;  Surgeon: Donnald Charleston, MD;  Location: [facility] ENDOSCOPY;  Service: Endoscopy;;   RIGHT AND LEFT HEART CATH N/A 01/13/2019   Procedure: RIGHT AND LEFT HEART CATH;  Surgeon: Cherrie Toribio SAUNDERS, MD;  Location: [facility] INVASIVE CV LAB;  Service: Cardiovascular;  Laterality: N/A;   TEE WITHOUT CARDIOVERSION N/A 12/17/2013   Procedure: TRANSESOPHAGEAL ECHOCARDIOGRAM (TEE);  Surgeon: Vinie KYM Maxcy, MD;  Location: [facility] ENDOSCOPY;  Service: Cardiovascular;  Laterality: N/A;  trish/ja   TOTAL HIP ARTHROPLASTY Left 09/25/2021   Procedure: LEFT TOTAL HIP ARTHROPLASTY ANTERIOR APPROACH;  Surgeon: Liam Lerner, MD;  Location: [facility] ORS;  Service: Orthopedics;  Laterality: Left;   TRANSESOPHAGEAL ECHOCARDIOGRAM (CATH LAB) N/A 05/01/2024   Procedure: TRANSESOPHAGEAL ECHOCARDIOGRAM;  Surgeon: Francyne Headland, MD;  Location: [facility] INVASIVE CV LAB;  Service: Cardiovascular;  Laterality: N/A;   TRANSESOPHAGEAL ECHOCARDIOGRAM (CATH LAB) N/A 06/11/2024   Procedure: TRANSESOPHAGEAL ECHOCARDIOGRAM;  Surgeon: Kennyth Chew, MD;  Location: [facility] INVASIVE CV LAB;  Service: Cardiovascular;  Laterality: N/A;   TRANSTHORACIC ECHOCARDIOGRAM  02/26/2008   LV systolic function normal with mild conc LVH; LA mildly dilated; trace MR/TR   UPPER GI ENDOSCOPY  2009   negative   Family History  Problem Relation Age of Onset   Heart attack Father 38       2nd MI at 34   Colon cancer Brother 4   Gout Brother    Ulcers Mother    Emphysema Mother 48   Colon polyps Sister    Cancer Sister        Basal cell carcinoma   Pneumonia Maternal Grandmother    Hypertension Brother    Hyperlipidemia Brother    Cancer Brother        Skin   Diabetes Neg Hx    Stroke Neg Hx    Social History   Socioeconomic History   Marital status: Married    Spouse name: Not on file   Number of children: 2   Years of education: master's   Highest education level: Not on file  Occupational History  Occupation: Audiological Scientist: [employer]  Tobacco Use   Smoking status: Former    Current packs/day: 0.00    Average packs/day: 1.5 packs/day for 30.0 years (45.0 ttl pk-yrs)    Types: Cigarettes    Start date:  08/14/1963    Quit date: 08/13/1993    Years since quitting: 30.9   Smokeless tobacco: Never  Vaping Use   Vaping status: Never Used  Substance and Sexual Activity   Alcohol use: No   Drug use: No   Sexual activity: Not Currently  Other Topics Concern   Not on file  Social History Narrative   ** Merged History Encounter **    Married   Social Drivers of Corporate Investment Banker Strain: Not on file  Food Insecurity: No Food Insecurity (07/03/2024)   Hunger Vital Sign    Worried About Running Out of Food in the Last Year: Never true    Ran Out of Food in the Last Year: Never true  Recent Concern: Food Insecurity - Food Insecurity Present (05/12/2024)   Hunger Vital Sign    Worried About Running Out of Food in the Last Year: Sometimes true    Ran Out of Food in the Last Year: Never true  Transportation Needs: No Transportation Needs (07/03/2024)   PRAPARE - Administrator, Civil Service (Medical): No    Lack of Transportation (Non-Medical): No  Physical Activity: Insufficiently Active (07/03/2024)   Exercise Vital Sign    Days of Exercise per Week: 2 days    Minutes of Exercise per Session: 40 min  Stress: No Stress Concern Present (07/03/2024)   [employer] of Occupational Health - Occupational Stress Questionnaire    Feeling of Stress: Not at all  Social Connections: Moderately Isolated (07/03/2024)   Social Connection and Isolation Panel    Frequency of Communication with Friends and Family: More than three times a week    Frequency of Social Gatherings with Friends and Family: More than three times a week    Attends Religious Services: Never    Database Administrator or Organizations: No    Attends Banker Meetings: Never    Marital Status: Married  Catering Manager Violence: Not At Risk (07/03/2024)   Humiliation, Afraid, Rape, and Kick questionnaire    Fear of Current or Ex-Partner: No    Emotionally Abused: No    Physically Abused: No     Sexually Abused: No       Objective:  BP (!) 153/75   Pulse 97   Ht 5' 2 (1.575 m) Comment: Per pt  Wt 169 lb (76.7 kg)   SpO2 95% Comment: RA  BMI 30.91 kg/m  BMI Readings from Last 3 Encounters:  07/07/24 30.91 kg/m  07/03/24 30.36 kg/m  06/11/24 30.36 kg/m    Physical Exam: Physical Exam   ENT: Normal mucosa. No hypertrophy of inferior turbinates. Tonsils are normal sized. Modified Mallampati score is normal. PULMONARY: Lungs clear to auscultation bilaterally, no adventitious breath sounds. CARDIOVASCULAR: Atrial fibrillation present. S1 S2 normal, no murmurs. ABDOMEN: Abdomen soft, nontender. Bowel sounds are normal. EXTREMITIES: No peripheral edema noted.       Diagnostic Review:  Last metabolic panel Lab Results  Component Value Date   GLUCOSE 171 (H) 06/08/2024   NA 129 (L) 06/08/2024   K 4.4 06/08/2024   CL 88 (L) 06/08/2024   CO2 24 06/08/2024   BUN 31 (H) 06/08/2024   CREATININE  1.29 (H) 06/08/2024   EGFR 43 (L) 06/08/2024   CALCIUM  9.6 06/08/2024   PHOS 3.7 05/12/2024   PROT 7.1 05/14/2024   ALBUMIN  3.6 05/14/2024   LABGLOB 2.7 12/11/2023   AGRATIO 1.3 11/17/2019   BILITOT 0.6 05/14/2024   ALKPHOS 55 05/14/2024   AST 20 05/14/2024   ALT 15 05/14/2024   ANIONGAP 10 05/14/2024         Assessment & Plan:   Assessment & Plan Chronic obstructive pulmonary disease with emphysema, unspecified emphysema type (HCC)     Restless leg syndrome  Orders:   gabapentin  (NEURONTIN ) 100 MG capsule; Take 2 capsules (200 mg total) by mouth at bedtime.  PLMD (periodic limb movement disorder)  Orders:   Iron , TIBC and Ferritin Panel; Future   gabapentin  (NEURONTIN ) 100 MG capsule; Take 2 capsules (200 mg total) by mouth at bedtime.  Nocturnal hypoxemia     Mild persistent asthma without complication  Orders:   budesonide -formoterol  (SYMBICORT ) 160-4.5 MCG/ACT inhaler; Inhale 2 puffs into the lungs 2 (two) times daily.     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 Discussed overlap due to hyperreactivity and triggers. - Changed inhaler to Symbicort , two puffs twice daily. - Continue Ventolin  as needed. - Follow-up in three months to assess inhaler efficacy and symptom control.  Restless legs syndrome and periodic limb movement disorder associated with iron  deficiency anemia RLS and PLMS linked to iron  deficiency. Recent iron  infusions provided temporary relief. Discussed ropinirole  augmentation and iron  infusion benefits. Iron  studies show significant deficiency. - Ordered iron  studies to assess need for further iron  infusion. - Prescribed gabapentin  200 mg at night.  - Continue ropinirole  as prescribed. Will consider stopping this in future to prevent augmentation.  - consider talking to PCP to change Paxil  to wellbutrin and trazodone  which are less likely to affect PLMS.  - Follow-up in three months to evaluate iron  levels and symptom control. - check iron  levels.   Atrial fibrillation Known atrial fibrillation, currently in AFib.         Notes from 08/23/22 Dr Neysa reviewed as to gather relevant information for patient care and formulating plan.  He/She was counselled about not driving while drowsy which is common side effect of sleep related disorders.   Return in about 3 months (around 10/07/2024).   I personally spent a total of 35 minutes in the care of the patient today including preparing to see the patient, getting/reviewing separately obtained history, performing a medically appropriate exam/evaluation, counseling and educating, placing orders, documenting clinical information in the EHR, independently interpreting results, and communicating results.   Quincey Quesinberry, MD

## 2024-07-07 NOTE — Patient Instructions (Signed)
  VISIT SUMMARY:  Today, you came in for a follow-up appointment to discuss your COPD, restless legs syndrome, and iron  deficiency anemia. We also reviewed your current medications and made some adjustments to better manage your symptoms.  YOUR PLAN:  -CHRONIC OBSTRUCTIVE PULMONARY DISEASE AND ASTHMA OVERLAP SYNDROME: You have COPD with asthma overlap, which means your lungs are affected by both conditions, causing breathing difficulties. Your symptoms worsen in the fall, so we have changed your inhaler to Symbicort , which you should use two puffs twice daily. Continue using Ventolin  as needed. We will follow up in three months to see how you are doing with the new inhaler.  -RESTLESS LEGS SYNDROME AND PERIODIC LIMB MOVEMENT DISORDER ASSOCIATED WITH IRON  DEFICIENCY ANEMIA: Restless legs syndrome and periodic limb movement disorder cause uncomfortable sensations in your legs, especially at night, and are linked to your iron  deficiency. We have ordered iron  studies to see if you need another iron  infusion. You should continue taking ropinirole  and start taking gabapentin  200 mg at night. We will follow up in three months to check your iron  levels and see how you are feeling.  INSTRUCTIONS:  Please follow up in three months to assess the effectiveness of your new inhaler and to evaluate your iron  levels and symptom control. Make sure to take your medications as prescribed and let us  know if you experience any new or worsening symptoms.                      Contains text generated by Abridge.                                 Contains text generated by Abridge.

## 2024-07-07 NOTE — Assessment & Plan Note (Addendum)
 [name]

## 2024-07-08 ENCOUNTER — Ambulatory Visit: Payer: Self-pay

## 2024-07-08 LAB — IRON,TIBC AND FERRITIN PANEL
%SAT: 14 % — ABNORMAL LOW (ref 16–45)
Ferritin: 63 ng/mL (ref 16–288)
Iron: 64 ug/dL (ref 45–160)
TIBC: 442 ug/dL (ref 250–450)

## 2024-07-13 ENCOUNTER — Other Ambulatory Visit: Payer: Self-pay

## 2024-07-13 NOTE — Progress Notes (Signed)
 Therapy plan placed for FERAHEME  at [facility] to start benefits investigation.   Patient has history of RLS, PLMD associated with iron  deficiency anemia    Provider: Dr. Sammi Fredericks Last OV: 07/07/24   Dose: 510mg  at Day 0 then Day 7 (or after) Premedications: none   Serum ferritin: 63 (07/07/24) Transferrin ratio: 14 (07/07/24)  Hgb 10.5 (06/11/24)   [name], PharmD, BCPS Clinical Pharmacist  [facility]

## 2024-07-14 ENCOUNTER — Telehealth: Payer: Self-pay

## 2024-07-14 DIAGNOSIS — M6281 Muscle weakness (generalized): Secondary | ICD-10-CM | POA: Diagnosis not present

## 2024-07-14 DIAGNOSIS — M1611 Unilateral primary osteoarthritis, right hip: Secondary | ICD-10-CM | POA: Diagnosis not present

## 2024-07-14 DIAGNOSIS — Z9181 History of falling: Secondary | ICD-10-CM | POA: Diagnosis not present

## 2024-07-14 NOTE — Telephone Encounter (Signed)
 Dr. Pawar, patient will be scheduled as soon as possible.  Auth Submission: NO AUTH NEEDED Site of care: Site of care: [facility] Payer: [employer] Medication & CPT/J Code(s) submitted: Feraheme  (ferumoxytol ) [id number] Diagnosis Code:  Route of submission (phone, fax, portal):  Phone # Fax # Auth type: Buy/Bill PB Units/visits requested: 510mg  x 2 doses Reference number:  Approval from: 07/14/24 to 08/12/24

## 2024-07-16 ENCOUNTER — Telehealth: Payer: Self-pay | Admitting: *Deleted

## 2024-07-16 NOTE — Telephone Encounter (Signed)
 Copied from CRM #[id number]. Topic: Clinical - Prescription Issue >> Jul 13, 2024  4:42 PM Rilla B wrote: Reason for CRM: Patient calling in to say her insurance does not cover Symbicort , but per the pharmacy, Advair is available.   Patient would like a script for Advair sent to [facility].   Patient is also requesting to speak with the office manager.  Please call patient, after 3pm.  Called pt and there was no answer- LMTCB.

## 2024-07-17 MED ORDER — FLUTICASONE-SALMETEROL 250-50 MCG/ACT IN AEPB: 1.0000 | INHALATION_SPRAY | Freq: Two times a day (BID) | RESPIRATORY_TRACT | 11 refills | Status: DC

## 2024-07-17 NOTE — Telephone Encounter (Signed)
 Copied from CRM #[id number]. Topic: Clinical - Prescription Issue >> Jul 13, 2024  4:42 PM Rilla B wrote: Reason for CRM: Patient calling in to say her insurance does not cover Symbicort , but per the pharmacy, Advair is available.   Patient would like a script for Advair sent to [facility].   Patient is also requesting to speak with the office manager.  Please call patient, after 3pm. >> Jul 17, 2024  8:46 AM Corean SAUNDERS wrote: Patient is requesting a call back from Regino Ramirez as she missed her yesterday regarding the Advair.     [id number] Dr. Pawar - this pt is requesting Advair in place of her Symbicort  due to insurance coverage. Please advise, thank you!

## 2024-07-21 ENCOUNTER — Ambulatory Visit: Payer: Self-pay

## 2024-07-21 ENCOUNTER — Encounter: Payer: Self-pay | Admitting: Pharmacist

## 2024-07-21 DIAGNOSIS — M1611 Unilateral primary osteoarthritis, right hip: Secondary | ICD-10-CM | POA: Diagnosis not present

## 2024-07-21 DIAGNOSIS — Z9181 History of falling: Secondary | ICD-10-CM | POA: Diagnosis not present

## 2024-07-21 DIAGNOSIS — M6281 Muscle weakness (generalized): Secondary | ICD-10-CM | POA: Diagnosis not present

## 2024-07-21 NOTE — Telephone Encounter (Signed)
 FYI Only or Action Required?: FYI only for provider: appointment scheduled on 07/23/24.  Patient was last seen in primary care on 06/08/2024 by Rollene Almarie LABOR, MD.  Called Nurse Triage reporting Shortness of Breath, Chest Pain, and Cough.  Symptoms began several days ago.  Interventions attempted: Prescription medications: only using her albuterol  inhaler once daily and states it will get her HR too elevated to use more than that and not using her new Advair inhaler as prescribed by pulmonary due to palpitations.  Symptoms are: gradually worsening.  Triage Disposition: See PCP When Office is Open (Within 3 Days) (overriding See HCP Within 4 Hours (Or PCP Triage))  Patient/caregiver understands and will follow disposition?: Yes          Reason for Disposition  [1] Longstanding difficulty breathing (e.g., CHF, COPD, emphysema) AND [2] WORSE than normal  Additional Information  Commented on: Answer Assessment    Patient also complains of headaches  x 3 weeks. No fever. She states the SOB is constant but has COPD and is worsened than her usual. She states she can go about 50 feet and feels the chest tightness/SOB. RN educated patient that as needed she can be using her albuterol  2 puffs every 6 hours or if in a severe episode 3 treatments every 20 minutes.  Protocols used: Breathing Difficulty-A-AH

## 2024-07-21 NOTE — Telephone Encounter (Addendum)
 During triage, patient and wife unable to hear RN. Audio issue, sending to call backs. RN made attempt to call back.  Wife states they are on their way to physical therapy this afternoon. Patient not in any respiratory distress. Patient and wife state they can not come in until Thursday to be seen and given strict ED precautions.    Copied from CRM #[id number]. Topic: Clinical - Red Word Triage >> Jul 21, 2024  3:19 PM Rosina BIRCH wrote: Reason for RMF:ejupzwu wife called stating the patient is having tightness in her chest, shortness of breath, a headache and congested Answer Assessment - Initial Assessment Questions 1. RESPIRATORY STATUS: Describe your breathing? (e.g., wheezing, shortness of breath, unable to speak, severe coughing)      Shortness of breath, chest tightness and coughing with clear mucous.  2. ONSET: When did this breathing problem begin?      Several days.  3. PATTERN Does the difficult breathing come and go, or has it been constant since it started?      Constant. She states even at rest she feels her SOB has worsened.  4. SEVERITY: How bad is your breathing? (e.g., mild, moderate, severe)      Severe enough to want to see a doctor  5. RECURRENT SYMPTOM: Have you had difficulty breathing before? If Yes, ask: When was the last time? and What happened that time?      *No Answer*  6. CARDIAC HISTORY: Do you have any history of heart disease? (e.g., heart attack, angina, bypass surgery, angioplasty)      CHF, a fib  7. LUNG HISTORY: Do you have any history of lung disease?  (e.g., pulmonary embolus, asthma, emphysema)     COPD  8. CAUSE: What do you think is causing the breathing problem?      *No Answer*  9. OTHER SYMPTOMS: Do you have any other symptoms? (e.g., chest pain, cough, dizziness, fever, runny nose)     *No Answer* 10. O2 SATURATION MONITOR:  Do you use an oxygen saturation monitor (pulse oximeter) at home? If Yes, ask: What is your  reading (oxygen level) today? What is your usual oxygen saturation reading? (e.g., 95%)       *No Answer* 11. PREGNANCY: Is there any chance you are pregnant? When was your last menstrual period?       *No Answer* 12. TRAVEL: Have you traveled out of the country in the last month? (e.g., travel history, exposures)       *No Answer*  Protocols used: Breathing Difficulty-A-AH

## 2024-07-21 NOTE — Progress Notes (Signed)
 Pharmacy Quality Measure Review  This patient is appearing on a report for being at risk of failing the adherence measure for diabetes medications this calendar year.   Medication: Janumet  Last fill date: 06/28/24 for 30 day supply  Insurance report was not up to date. No action needed at this time.   Darrelyn Drum, PharmD, BCPS, CPP Clinical Pharmacist Practitioner [facility] [phone]

## 2024-07-22 ENCOUNTER — Ambulatory Visit

## 2024-07-22 VITALS — BP 142/75 | HR 79 | Temp 98.3°F | Resp 14 | Ht 62.0 in | Wt 168.0 lb

## 2024-07-22 DIAGNOSIS — D500 Iron deficiency anemia secondary to blood loss (chronic): Secondary | ICD-10-CM | POA: Diagnosis not present

## 2024-07-22 MED ORDER — SODIUM CHLORIDE 0.9 % IV SOLN
510.0000 mg | Freq: Once | INTRAVENOUS | Status: AC
  Administered 2024-07-22: 510 mg via INTRAVENOUS
  Filled 2024-07-22: qty 17

## 2024-07-22 NOTE — Progress Notes (Signed)
 Diagnosis: Acute Anemia  Provider:  Mannam, Praveen MD  Procedure: IV Infusion  IV Type: Peripheral, IV Location: L Forearm  Feraheme  (Ferumoxytol ), Dose: 510 mg  Infusion Start Time: 1548  Infusion Stop Time: 1605  Post Infusion IV Care: Patient declined observation and Peripheral IV Discontinued  Discharge: Condition: Good, Destination: Home . AVS Declined  Performed by:  Rocky FORBES Sar, RN

## 2024-07-23 ENCOUNTER — Ambulatory Visit: Admitting: Family Medicine

## 2024-07-23 NOTE — Progress Notes (Deleted)
° °  Acute Office Visit  Subjective:     Patient ID: [name], female    DOB: 1951-04-02, 73 y.o.   MRN: [id number]  No chief complaint on file.   HPI  Discussed the use of AI scribe software for clinical note transcription with the patient, who gave verbal consent to proceed.  History of Present Illness      ROS Per HPI      Objective:    There were no vitals taken for this visit.   Physical Exam Vitals and nursing note reviewed.  Constitutional:      General: She is not in acute distress.    Appearance: Normal appearance. She is normal weight.  HENT:     Head: Normocephalic and atraumatic.     Right Ear: External ear normal.     Left Ear: External ear normal.     Nose: Nose normal.     Mouth/Throat:     Mouth: Mucous membranes are moist.     Pharynx: Oropharynx is clear.  Eyes:     Extraocular Movements: Extraocular movements intact.     Pupils: Pupils are equal, round, and reactive to light.  Cardiovascular:     Rate and Rhythm: Normal rate and regular rhythm.     Pulses: Normal pulses.     Heart sounds: Normal heart sounds.  Pulmonary:     Effort: Pulmonary effort is normal. No respiratory distress.     Breath sounds: Normal breath sounds. No wheezing, rhonchi or rales.  Musculoskeletal:        General: Normal range of motion.     Cervical back: Normal range of motion.     Right lower leg: No edema.     Left lower leg: No edema.  Lymphadenopathy:     Cervical: No cervical adenopathy.  Neurological:     General: No focal deficit present.     Mental Status: She is alert and oriented to person, place, and time.  Psychiatric:        Mood and Affect: Mood normal.        Thought Content: Thought content normal.     No results found for any visits on 07/23/24.      Assessment & Plan:   Assessment and Plan Assessment & Plan      No orders of the defined types were placed in this encounter.    No orders of the defined types were placed  in this encounter.   No follow-ups on file.  Corean LITTIE Ku, FNP

## 2024-07-24 NOTE — Telephone Encounter (Signed)
 Lvm  again  of request of needed   Mag level and clinic number to call back if any questions

## 2024-07-24 NOTE — Telephone Encounter (Signed)
-----   Message from Charlies Macario Arthur sent at 07/21/2024  9:00 AM EST ----- Please follow up She needs a new mag level ----- Message ----- From: Interface, [facility] Lab Results In Sent: 05/25/2024  11:36 PM EST To: Charlies Macario Arthur, PA-C

## 2024-07-27 ENCOUNTER — Other Ambulatory Visit: Payer: Self-pay | Admitting: Internal Medicine

## 2024-07-28 ENCOUNTER — Ambulatory Visit: Admitting: Podiatry

## 2024-07-28 VITALS — Ht 62.0 in | Wt 168.0 lb

## 2024-07-28 DIAGNOSIS — M2141 Flat foot [pes planus] (acquired), right foot: Secondary | ICD-10-CM

## 2024-07-28 DIAGNOSIS — B351 Tinea unguium: Secondary | ICD-10-CM

## 2024-07-28 DIAGNOSIS — E1142 Type 2 diabetes mellitus with diabetic polyneuropathy: Secondary | ICD-10-CM

## 2024-07-28 DIAGNOSIS — M2012 Hallux valgus (acquired), left foot: Secondary | ICD-10-CM

## 2024-07-28 DIAGNOSIS — L84 Corns and callosities: Secondary | ICD-10-CM

## 2024-07-28 NOTE — Progress Notes (Signed)
°  Subjective:  Patient ID: [name] Factor, female    DOB: May 17, 1951,  MRN: [id number]  Chief Complaint  Patient presents with   Diabetes    RM 3 DFC/ Possible nail avulsions. Pt is requesting referral for diabetic shoes.    73 y.o. female presents with the above complaint. History confirmed with patient.  She returns for follow-up, her nails are thickened elongated and causing pain and discomfort.  She previously has done well with extra-depth diabetic shoes and custom multidensity insoles that she would like to have new pair made  Objective:  Physical Exam: warm, good capillary refill, no trophic changes or ulcerative lesions, normal DP and PT pulses, peripheral neuropathy with abnormal sensory exam, and previous bilateral hallux nail removal.  Pinch callus medial hallux bilateral.  Hallux valgus deformity and pes planus. Left Foot: dystrophic yellowed discolored nail plates with subungual debris Right Foot: dystrophic yellowed discolored nail plates with subungual debris  Assessment:   1. Pain due to onychomycosis of toenails of both feet   2. Diabetic polyneuropathy associated with type 2 diabetes mellitus (HCC)   3. Hallux valgus, acquired, bilateral   4. Pes planus of both feet   5. Callus of foot      Plan:  Patient was evaluated and treated and all questions answered.  Discussed the etiology and treatment options for the condition in detail with the patient. Recommended debridement of the nails today. Sharp and mechanical debridement performed of all painful and mycotic nails today. Nails debrided in length and thickness using a nail nipper to level of comfort. Follow up as needed for painful nails.    Patient educated on diabetes. Discussed proper diabetic foot care and discussed risks and complications of disease. Educated patient in depth on reasons to return to the office immediately should he/she discover anything concerning or new on the feet. All questions answered.  Discussed proper shoes as well. Has done well with diabetic shoes.  Referral be sent to [facility] for this.  These are medically necessary due to her neuropathy and deformity.    Return in about 3 months (around 10/26/2024) for at risk diabetic foot care.

## 2024-07-28 NOTE — H&P (View-Only) (Signed)
 Cardiology Office Note:  .   Date:  07/28/2024  ID:  [name], DOB 06-30-1951, MRN [id number] PCP: Rollene Almarie LABOR, MD  [facility] Providers Cardiologist:  Jerel Balding, MD Electrophysiologist:  Fonda Kitty, MD {  History of Present Illness: .   [name] is a 73 y.o. female w/PMHx of  HTN, HLD, DM, COPD, IDA CAD (posterior lateral ventricular branch stent in 2009) Chronic diastolic CHF AFib  Tendencies towards falls/injuries pericardiocentesis for hemothorax with tamponade in June 2020  GIB in 2022 (to a Hgb of 6)  Referred to Dr. Kitty 03/13/24 for consideration of Watchman Given contrast allergy > planned for TEE to evaluate anatomy  TEE 05/01/24 Severe biatrial dilation. Appendage shape and measurements appropriate for Watchman device, probably 27 mm. No thrombus seen  Admitted 05/12/24 Lightheaded, DOE, AFib 40's, admitted for symptomatic bradycardia Home dilt and metop held, lytes corrected (hyperkalemia. Low magnesium  level) Given AFib resumed metop tart 50mg  BID (1/2 home dose) then her dilt 120mg  daily (prior was 180) Discharged 05/14/24  Saw cards team 05/21/24 Planned for more frequent labs/lytes Volume stable  I saw her 05/25/24 for her pre-watchman visit  Watchman implanted 06/11/24 Post procedure developed marked ecchymosis, US  done c/w partially thrombosed pseudoaneurysm VVS consulted and performed duplex directed thrombin  injection on 10/31 Discharged 06/13/24, US  this day with thrombosed pseudoaneurysm, stable site Eliquis  resumed  TEE is scheduled for 08/14/24  Today's visit is scheduled as her post-watchman visit ROS:    She comes today accompanied by her wife. No awareness of her Afib She had profound ecchymosis abdomen, leg, pelvic area, was very painful. While it took weeks, did finally all resolve No ongoing concerns, with mild tenderness only with very hard palpation. Walking without pain, difficulty Denies any  CP, SOB No near syncope or syncope, no dizzy spells. Reports good medication compliance   Arrhythmia/AAD hx AFib No AAD to date Watchman implant 06/11/24, complicated by pseudoaneurysm > ijected with thrombin  by VVS  Studies Reviewed: [name]    EKG done today and reviewed by myself AFib 93bpm   06/11/24: Watchman CONCLUSIONS:  1.Successful implantation of a 27mm WATCHMAN left atrial appendage occlusive device. 2. TEE demonstrating no LAA thrombus. 3. No early apparent complications.    Post Implant Anticoagulation Strategy: Continue Eliquis  for 45 days then transition to dual antiplatelet therapy with aspirin  81mg  and clopidogrel 75mg  daily. Repeat imaging with TEE in 60 days.  06/11/24: TEE 1. Left ventricular ejection fraction, by estimation, is 55 to 60%. The  left ventricle has normal function.   2. Right ventricular systolic function is moderately reduced. The right  ventricular size is mildly enlarged.   3. Prior to procedure, a patent left atrial appendage was noted and  confirmed by [facility] . 2D sizing for LAA- 20 mm.      A 27 mm Watchman FLX pro was placed. Average compression ~17 %. No  leaks or DVT. Negative tug test. Shunting post procedure all left to  right. No pericardial effusion.. Left atrial size was moderately dilated.  No left atrial/left atrial appendage  thrombus was detected.   4. Right atrial size was moderately dilated.   5. The mitral valve is normal in structure. Trivial mitral valve  regurgitation. No evidence of mitral stenosis.   6. 2D vena contracta. The aortic valve is tricuspid. Aortic valve  regurgitation is mild. Aortic valve sclerosis is present, with no evidence  of aortic valve stenosis.   7. There is Moderate (Grade III) plaque  involving the descending aorta.   8. 3D performed of the LAA and demonstrates 3D LA dimension prior to  procedure 20 mm.   9. Evidence of atrial level shunting detected by color flow Doppler.  10. Patient under  general anesthesia for procedure.    05/01/24: TEE  1. Left ventricular ejection fraction, by estimation, is 55 to 60%. The  left ventricle has normal function. The left ventricle has no regional  wall motion abnormalities. Left ventricular diastolic function could not  be evaluated.   2. Right ventricular systolic function is moderately reduced. The right  ventricular size is moderately enlarged. Tricuspid regurgitation signal is  inadequate for assessing PA pressure.   3. Left atrial appendage measurements      00 degrees----diameter 18 mm----depth 17 mm      45 degrees----diameter 18 mm----depth 14 mm      90 degrees----diameter 23 mm----depth 19 mm      135 degrees---diameter 23 mm----depth 20 mm      . Left atrial size was severely dilated. No left atrial/left atrial  appendage thrombus was detected.   4. Right atrial size was severely dilated.   5. The mitral valve is normal in structure. Mild mitral valve  regurgitation.   6. The aortic valve is tricuspid. Aortic valve regurgitation is mild. No  aortic stenosis is present.   7. There is Moderate (Grade III) protruding plaque involving the  descending aorta.   8. The thin secundum part of the atrial septum is extensive, allowing  broad options for transseptal puncture.   9. 3D performed of the LAA and demonstrates anatomy amenable to Watchman  device deployment (probably 27 mm device).   She had a nuclear stress test in June 12, 2021 showed low risk findings without evidence of ischemia or previous infarction.   Her last echocardiogram in A October 2023 showed LVEF of 60-65%.  The left atrium is moderately dilated (ESVI 39 mL/m).  There were no serious valvular abnormalities.  She had a normal perfusion pattern on her nuclear stress tests from 2012, 2014, 2016 and October 2022.  Risk Assessment/Calculations:    Physical Exam:   VS:  There were no vitals taken for this visit.   Wt Readings from Last 3 Encounters:   07/22/24 168 lb (76.2 kg)  07/07/24 169 lb (76.7 kg)  07/03/24 166 lb (75.3 kg)    GEN: Well nourished, well developed in no acute distress NECK: No JVD; No carotid bruits CARDIAC: irreg-irreg, no murmurs, rubs, gallops RESPIRATORY:  CTA b/l without rales, wheezing or rhonchi  ABDOMEN: Soft, non-tender, non-distended EXTREMITIES:  No edema; No deformity  R groin site/abdomen, with no remaining ecchymosis, no groin tenderness to palpation today  ASSESSMENT AND PLAN: .    Longstanding persistent AFib CHA2DS2Vasc is 7, on Eliquis , appropriately dosed S/p watchman 06/11/24 She is allergic to plavix (previously discussed) >> Eliquis  for her 6 mo post watchman period then anticipate ASA alone She does not have teethe, no dental prophylaxis will be needed. TEE is scheduled, reviewed that procedure, potential risks/benefits, she remains willing to proceed  2.  Hypomagnesemia Repeat mag level  Informed Consent   Shared Decision Making/Informed Consent   The risks [esophageal damage, perforation (1:10,000 risk), bleeding, pharyngeal hematoma as well as other potential complications associated with conscious sedation including aspiration, arrhythmia, respiratory failure and death], benefits (treatment guidance and diagnostic support) and alternatives of a transesophageal echocardiogram were discussed in detail with Ms. Gartin and she is willing to proceed.  Secondary hypercoagulable state 2/2 AFib    Dispo: back in 22mo, sooner if needed    Signed, Charlies Macario Arthur, PA-C

## 2024-07-28 NOTE — Progress Notes (Unsigned)
 Cardiology Office Note:  .   Date:  07/28/2024  ID:  [name], DOB 06-30-1951, MRN [id number] PCP: Rollene Almarie LABOR, MD  [facility] Providers Cardiologist:  Jerel Balding, MD Electrophysiologist:  Fonda Kitty, MD {  History of Present Illness: .   [name] is a 73 y.o. female w/PMHx of  HTN, HLD, DM, COPD, IDA CAD (posterior lateral ventricular branch stent in 2009) Chronic diastolic CHF AFib  Tendencies towards falls/injuries pericardiocentesis for hemothorax with tamponade in June 2020  GIB in 2022 (to a Hgb of 6)  Referred to Dr. Kitty 03/13/24 for consideration of Watchman Given contrast allergy > planned for TEE to evaluate anatomy  TEE 05/01/24 Severe biatrial dilation. Appendage shape and measurements appropriate for Watchman device, probably 27 mm. No thrombus seen  Admitted 05/12/24 Lightheaded, DOE, AFib 40's, admitted for symptomatic bradycardia Home dilt and metop held, lytes corrected (hyperkalemia. Low magnesium  level) Given AFib resumed metop tart 50mg  BID (1/2 home dose) then her dilt 120mg  daily (prior was 180) Discharged 05/14/24  Saw cards team 05/21/24 Planned for more frequent labs/lytes Volume stable  I saw her 05/25/24 for her pre-watchman visit  Watchman implanted 06/11/24 Post procedure developed marked ecchymosis, US  done c/w partially thrombosed pseudoaneurysm VVS consulted and performed duplex directed thrombin  injection on 10/31 Discharged 06/13/24, US  this day with thrombosed pseudoaneurysm, stable site Eliquis  resumed  TEE is scheduled for 08/14/24  Today's visit is scheduled as her post-watchman visit ROS:    She comes today accompanied by her wife. No awareness of her Afib She had profound ecchymosis abdomen, leg, pelvic area, was very painful. While it took weeks, did finally all resolve No ongoing concerns, with mild tenderness only with very hard palpation. Walking without pain, difficulty Denies any  CP, SOB No near syncope or syncope, no dizzy spells. Reports good medication compliance   Arrhythmia/AAD hx AFib No AAD to date Watchman implant 06/11/24, complicated by pseudoaneurysm > ijected with thrombin  by VVS  Studies Reviewed: [name]    EKG done today and reviewed by myself AFib 93bpm   06/11/24: Watchman CONCLUSIONS:  1.Successful implantation of a 27mm WATCHMAN left atrial appendage occlusive device. 2. TEE demonstrating no LAA thrombus. 3. No early apparent complications.    Post Implant Anticoagulation Strategy: Continue Eliquis  for 45 days then transition to dual antiplatelet therapy with aspirin  81mg  and clopidogrel 75mg  daily. Repeat imaging with TEE in 60 days.  06/11/24: TEE 1. Left ventricular ejection fraction, by estimation, is 55 to 60%. The  left ventricle has normal function.   2. Right ventricular systolic function is moderately reduced. The right  ventricular size is mildly enlarged.   3. Prior to procedure, a patent left atrial appendage was noted and  confirmed by [facility] . 2D sizing for LAA- 20 mm.      A 27 mm Watchman FLX pro was placed. Average compression ~17 %. No  leaks or DVT. Negative tug test. Shunting post procedure all left to  right. No pericardial effusion.. Left atrial size was moderately dilated.  No left atrial/left atrial appendage  thrombus was detected.   4. Right atrial size was moderately dilated.   5. The mitral valve is normal in structure. Trivial mitral valve  regurgitation. No evidence of mitral stenosis.   6. 2D vena contracta. The aortic valve is tricuspid. Aortic valve  regurgitation is mild. Aortic valve sclerosis is present, with no evidence  of aortic valve stenosis.   7. There is Moderate (Grade III) plaque  involving the descending aorta.   8. 3D performed of the LAA and demonstrates 3D LA dimension prior to  procedure 20 mm.   9. Evidence of atrial level shunting detected by color flow Doppler.  10. Patient under  general anesthesia for procedure.    05/01/24: TEE  1. Left ventricular ejection fraction, by estimation, is 55 to 60%. The  left ventricle has normal function. The left ventricle has no regional  wall motion abnormalities. Left ventricular diastolic function could not  be evaluated.   2. Right ventricular systolic function is moderately reduced. The right  ventricular size is moderately enlarged. Tricuspid regurgitation signal is  inadequate for assessing PA pressure.   3. Left atrial appendage measurements      00 degrees----diameter 18 mm----depth 17 mm      45 degrees----diameter 18 mm----depth 14 mm      90 degrees----diameter 23 mm----depth 19 mm      135 degrees---diameter 23 mm----depth 20 mm      . Left atrial size was severely dilated. No left atrial/left atrial  appendage thrombus was detected.   4. Right atrial size was severely dilated.   5. The mitral valve is normal in structure. Mild mitral valve  regurgitation.   6. The aortic valve is tricuspid. Aortic valve regurgitation is mild. No  aortic stenosis is present.   7. There is Moderate (Grade III) protruding plaque involving the  descending aorta.   8. The thin secundum part of the atrial septum is extensive, allowing  broad options for transseptal puncture.   9. 3D performed of the LAA and demonstrates anatomy amenable to Watchman  device deployment (probably 27 mm device).   She had a nuclear stress test in June 12, 2021 showed low risk findings without evidence of ischemia or previous infarction.   Her last echocardiogram in A October 2023 showed LVEF of 60-65%.  The left atrium is moderately dilated (ESVI 39 mL/m).  There were no serious valvular abnormalities.  She had a normal perfusion pattern on her nuclear stress tests from 2012, 2014, 2016 and October 2022.  Risk Assessment/Calculations:    Physical Exam:   VS:  There were no vitals taken for this visit.   Wt Readings from Last 3 Encounters:   07/22/24 168 lb (76.2 kg)  07/07/24 169 lb (76.7 kg)  07/03/24 166 lb (75.3 kg)    GEN: Well nourished, well developed in no acute distress NECK: No JVD; No carotid bruits CARDIAC: irreg-irreg, no murmurs, rubs, gallops RESPIRATORY:  CTA b/l without rales, wheezing or rhonchi  ABDOMEN: Soft, non-tender, non-distended EXTREMITIES:  No edema; No deformity  R groin site/abdomen, with no remaining ecchymosis, no groin tenderness to palpation today  ASSESSMENT AND PLAN: .    Longstanding persistent AFib CHA2DS2Vasc is 7, on Eliquis , appropriately dosed S/p watchman 06/11/24 She is allergic to plavix (previously discussed) >> Eliquis  for her 6 mo post watchman period then anticipate ASA alone She does not have teethe, no dental prophylaxis will be needed. TEE is scheduled, reviewed that procedure, potential risks/benefits, she remains willing to proceed  2.  Hypomagnesemia Repeat mag level  Informed Consent   Shared Decision Making/Informed Consent   The risks [esophageal damage, perforation (1:10,000 risk), bleeding, pharyngeal hematoma as well as other potential complications associated with conscious sedation including aspiration, arrhythmia, respiratory failure and death], benefits (treatment guidance and diagnostic support) and alternatives of a transesophageal echocardiogram were discussed in detail with Ms. Gartin and she is willing to proceed.  Secondary hypercoagulable state 2/2 AFib    Dispo: back in 22mo, sooner if needed    Signed, Charlies Macario Arthur, PA-C

## 2024-07-28 NOTE — Telephone Encounter (Signed)
 Pt stated that she was not seen by NP, showed up late and she had taken someone else. Pt stated that she is feeling better today. Advised to call if she needs further assistance. Pt verbalized understanding.

## 2024-07-29 ENCOUNTER — Ambulatory Visit: Attending: Cardiovascular Disease | Admitting: Physician Assistant

## 2024-07-29 VITALS — BP 138/71 | HR 93 | Ht 62.0 in | Wt 167.0 lb

## 2024-07-29 DIAGNOSIS — Z95818 Presence of other cardiac implants and grafts: Secondary | ICD-10-CM

## 2024-07-29 DIAGNOSIS — I4821 Permanent atrial fibrillation: Secondary | ICD-10-CM

## 2024-07-29 LAB — BASIC METABOLIC PANEL WITH GFR
BUN/Creatinine Ratio: 30 — ABNORMAL HIGH (ref 12–28)
BUN: 34 mg/dL — ABNORMAL HIGH (ref 8–27)
CO2: 23 mmol/L (ref 20–29)
Calcium: 8.9 mg/dL (ref 8.7–10.3)
Chloride: 91 mmol/L — ABNORMAL LOW (ref 96–106)
Creatinine, Ser: 1.13 mg/dL — ABNORMAL HIGH (ref 0.57–1.00)
Glucose: 131 mg/dL — ABNORMAL HIGH (ref 70–99)
Potassium: 4 mmol/L (ref 3.5–5.2)
Sodium: 131 mmol/L — ABNORMAL LOW (ref 134–144)
eGFR: 51 mL/min/1.73 — ABNORMAL LOW (ref >59)

## 2024-07-29 LAB — CBC
Hematocrit: 30.4 % — ABNORMAL LOW (ref 34.0–46.6)
Hemoglobin: 9.7 g/dL — ABNORMAL LOW (ref 11.1–15.9)
MCH: 29.7 pg (ref 26.6–33.0)
MCHC: 31.9 g/dL (ref 31.5–35.7)
MCV: 93 fL (ref 79–97)
Platelets: 183 x10E3/uL (ref 150–450)
RBC: 3.27 x10E6/uL — ABNORMAL LOW (ref 3.77–5.28)
RDW: 15.4 % (ref 11.7–15.4)
WBC: 7.9 x10E3/uL (ref 3.4–10.8)

## 2024-07-29 LAB — MAGNESIUM: Magnesium: 1.2 mg/dL — ABNORMAL LOW (ref 1.6–2.3)

## 2024-07-29 NOTE — Patient Instructions (Signed)
 Medication Instructions:   Your physician recommends that you continue on your current medications as directed. Please refer to the Current Medication list given to you today.  *If you need a refill on your cardiac medications before your next appointment, please call your pharmacy*   Lab Work:   PLEASE GO DOWN STAIRS  [facility]  FIRST FLOOR   ( GET OFF ELEVATORS WALK TOWARDS WAITING AREA LAB LOCATED BY PHARMACY):  MAG AND CBC TODAY     If you have labs (blood work) drawn today and your tests are completely normal, you will receive your results only by: MyChart Message (if you have MyChart) OR A paper copy in the mail If you have any lab test that is abnormal or we need to change your treatment, we will call you to review the results.   Testing/Procedures: NONE ORDERED  TODAY   Follow-Up: At [facility], you and your health needs are our priority.  As part of our continuing mission to provide you with exceptional heart care, our providers are all part of one team.  This team includes your primary Cardiologist (physician) and Advanced Practice Providers or APPs (Physician Assistants and Nurse Practitioners) who all work together to provide you with the care you need, when you need it.  Your next appointment:   5 month(s)  Provider:   You may see Fonda Kitty, MD or one of the following Advanced Practice Providers on your designated Care Team:   Charlies Arthur, [place]  We recommend signing up for the patient portal called MyChart.  Sign up information is provided on this After Visit Summary.  MyChart is used to connect with patients for Virtual Visits (Telemedicine).  Patients are able to view lab/test results, encounter notes, upcoming appointments, etc.  Non-urgent messages can be sent to your provider as well.   To learn more about what you can do with MyChart, go to [email or web address].   Other Instructions

## 2024-07-30 ENCOUNTER — Ambulatory Visit: Payer: Self-pay | Admitting: Physician Assistant

## 2024-07-30 DIAGNOSIS — Z79899 Other long term (current) drug therapy: Secondary | ICD-10-CM

## 2024-07-30 MED ORDER — MAGNESIUM OXIDE 400 MG PO TABS: 400.0000 mg | ORAL_TABLET | Freq: Two times a day (BID) | ORAL | 2 refills | Status: AC

## 2024-07-30 NOTE — Telephone Encounter (Signed)
-----   Message from Charlies Macario Arthur sent at 07/30/2024 12:59 PM EST ----- CBC is stable Magnesium  remains very low. This is VERY important. Increase her mag ox to 400mg  BID Absolutetly needs another mag level in a week afte increasing dose. Please educate her/them about NEED to get the lab done

## 2024-07-30 NOTE — Telephone Encounter (Signed)
 Lvm for spouse to call direct number [phone] for  lab results and recommendations

## 2024-08-03 NOTE — Telephone Encounter (Signed)
-----   Message from Charlies Macario Arthur sent at 07/30/2024 12:59 PM EST ----- CBC is stable Magnesium  remains very low. This is VERY important. Increase her mag ox to 400mg  BID Absolutetly needs another mag level in a week afte increasing dose. Please educate her/them about NEED to get the lab done

## 2024-08-03 NOTE — Telephone Encounter (Signed)
 Lvm checking with feedback and going to get labs.  Mychart messaged showed seen for message sent.

## 2024-08-04 ENCOUNTER — Ambulatory Visit (INDEPENDENT_AMBULATORY_CARE_PROVIDER_SITE_OTHER)

## 2024-08-04 VITALS — BP 126/67 | HR 69 | Temp 97.8°F | Resp 14 | Ht 62.0 in | Wt 167.0 lb

## 2024-08-04 DIAGNOSIS — D500 Iron deficiency anemia secondary to blood loss (chronic): Secondary | ICD-10-CM | POA: Diagnosis not present

## 2024-08-04 MED ORDER — SODIUM CHLORIDE 0.9 % IV SOLN
510.0000 mg | Freq: Once | INTRAVENOUS | Status: AC
  Administered 2024-08-04: 510 mg via INTRAVENOUS
  Filled 2024-08-04: qty 17

## 2024-08-04 NOTE — Progress Notes (Signed)
 Diagnosis: Acute Anemia  Provider:  Mannam, Praveen MD  Procedure: IV Infusion  IV Type: Peripheral, IV Location: L Forearm  Feraheme  (Ferumoxytol ), Dose: 510 mg  Infusion Start Time: 1535  Infusion Stop Time: 1550  Post Infusion IV Care: Patient declined observation and Peripheral IV Discontinued  Discharge: Condition: Good, Destination: Home . AVS Declined  Performed by:  Rocky FORBES Sar, RN

## 2024-08-07 LAB — MAGNESIUM: Magnesium: 1.5 mg/dL — ABNORMAL LOW (ref 1.6–2.3)

## 2024-08-10 ENCOUNTER — Other Ambulatory Visit: Payer: Self-pay | Admitting: Internal Medicine

## 2024-08-11 ENCOUNTER — Other Ambulatory Visit: Payer: Self-pay | Admitting: Internal Medicine

## 2024-08-11 NOTE — Addendum Note (Signed)
 Addended by: RUBBIE KERRI HERO on: 08/11/2024 09:40 AM   Modules accepted: Orders

## 2024-08-14 ENCOUNTER — Ambulatory Visit (HOSPITAL_COMMUNITY)

## 2024-08-14 ENCOUNTER — Ambulatory Visit (HOSPITAL_COMMUNITY)
Admission: RE | Admit: 2024-08-14 | Discharge: 2024-08-14 | Disposition: A | Discharge status: Home / Self Care | Attending: Cardiovascular Disease | Admitting: Cardiovascular Disease

## 2024-08-14 ENCOUNTER — Encounter (HOSPITAL_COMMUNITY)
Admission: RE | Disposition: A | Discharge status: Home / Self Care | Payer: Self-pay | Source: Home / Self Care | Attending: Cardiovascular Disease

## 2024-08-14 ENCOUNTER — Other Ambulatory Visit: Payer: Self-pay

## 2024-08-14 DIAGNOSIS — Z955 Presence of coronary angioplasty implant and graft: Secondary | ICD-10-CM | POA: Diagnosis not present

## 2024-08-14 DIAGNOSIS — J449 Chronic obstructive pulmonary disease, unspecified: Secondary | ICD-10-CM | POA: Insufficient documentation

## 2024-08-14 DIAGNOSIS — Z7902 Long term (current) use of antithrombotics/antiplatelets: Secondary | ICD-10-CM | POA: Insufficient documentation

## 2024-08-14 DIAGNOSIS — Z87891 Personal history of nicotine dependence: Secondary | ICD-10-CM | POA: Insufficient documentation

## 2024-08-14 DIAGNOSIS — Z95818 Presence of other cardiac implants and grafts: Secondary | ICD-10-CM | POA: Insufficient documentation

## 2024-08-14 DIAGNOSIS — I4811 Longstanding persistent atrial fibrillation: Secondary | ICD-10-CM | POA: Insufficient documentation

## 2024-08-14 DIAGNOSIS — E785 Hyperlipidemia, unspecified: Secondary | ICD-10-CM | POA: Insufficient documentation

## 2024-08-14 DIAGNOSIS — I350 Nonrheumatic aortic (valve) stenosis: Secondary | ICD-10-CM | POA: Diagnosis not present

## 2024-08-14 DIAGNOSIS — Z91041 Radiographic dye allergy status: Secondary | ICD-10-CM | POA: Insufficient documentation

## 2024-08-14 DIAGNOSIS — Z888 Allergy status to other drugs, medicaments and biological substances status: Secondary | ICD-10-CM | POA: Diagnosis not present

## 2024-08-14 DIAGNOSIS — Z7901 Long term (current) use of anticoagulants: Secondary | ICD-10-CM | POA: Diagnosis not present

## 2024-08-14 DIAGNOSIS — I700 Atherosclerosis of aorta: Secondary | ICD-10-CM | POA: Insufficient documentation

## 2024-08-14 DIAGNOSIS — Z79899 Other long term (current) drug therapy: Secondary | ICD-10-CM | POA: Diagnosis not present

## 2024-08-14 DIAGNOSIS — I509 Heart failure, unspecified: Secondary | ICD-10-CM | POA: Diagnosis not present

## 2024-08-14 DIAGNOSIS — I351 Nonrheumatic aortic (valve) insufficiency: Secondary | ICD-10-CM | POA: Diagnosis not present

## 2024-08-14 DIAGNOSIS — I110 Hypertensive heart disease with heart failure: Secondary | ICD-10-CM | POA: Diagnosis not present

## 2024-08-14 DIAGNOSIS — D509 Iron deficiency anemia, unspecified: Secondary | ICD-10-CM | POA: Insufficient documentation

## 2024-08-14 DIAGNOSIS — D6869 Other thrombophilia: Secondary | ICD-10-CM | POA: Insufficient documentation

## 2024-08-14 DIAGNOSIS — I2510 Atherosclerotic heart disease of native coronary artery without angina pectoris: Secondary | ICD-10-CM | POA: Diagnosis not present

## 2024-08-14 DIAGNOSIS — K219 Gastro-esophageal reflux disease without esophagitis: Secondary | ICD-10-CM | POA: Diagnosis not present

## 2024-08-14 DIAGNOSIS — E119 Type 2 diabetes mellitus without complications: Secondary | ICD-10-CM | POA: Diagnosis not present

## 2024-08-14 DIAGNOSIS — Z7982 Long term (current) use of aspirin: Secondary | ICD-10-CM | POA: Insufficient documentation

## 2024-08-14 DIAGNOSIS — I5032 Chronic diastolic (congestive) heart failure: Secondary | ICD-10-CM | POA: Diagnosis not present

## 2024-08-14 DIAGNOSIS — I4821 Permanent atrial fibrillation: Secondary | ICD-10-CM

## 2024-08-14 HISTORY — PX: TRANSESOPHAGEAL ECHOCARDIOGRAM (CATH LAB): EP1270

## 2024-08-14 LAB — ECHO TEE

## 2024-08-14 SURGERY — TRANSESOPHAGEAL ECHOCARDIOGRAM (TEE) (CATHLAB)
Anesthesia: Monitor Anesthesia Care

## 2024-08-14 MED ORDER — PROPOFOL 10 MG/ML IV BOLUS
INTRAVENOUS | Status: DC | PRN
  Administered 2024-08-14: 40 mg via INTRAVENOUS
  Administered 2024-08-14: 20 mg via INTRAVENOUS
  Administered 2024-08-14: 30 mg via INTRAVENOUS

## 2024-08-14 MED ORDER — PROPOFOL 500 MG/50ML IV EMUL
INTRAVENOUS | Status: DC | PRN
  Administered 2024-08-14: 150 ug/kg/min via INTRAVENOUS

## 2024-08-14 MED ORDER — LIDOCAINE 2% (20 MG/ML) 5 ML SYRINGE
INTRAMUSCULAR | Status: DC | PRN
  Administered 2024-08-14: 80 mg via INTRAVENOUS

## 2024-08-14 MED ORDER — SODIUM CHLORIDE 0.9 % IV SOLN: INTRAVENOUS | Status: DC

## 2024-08-14 NOTE — Progress Notes (Signed)
" °  Echocardiogram Echocardiogram Transesophageal has been performed.  Leonette Tischer 08/14/2024, 10:44 AM "

## 2024-08-14 NOTE — Anesthesia Postprocedure Evaluation (Signed)
"   Anesthesia Post Note  Patient: [name]  Procedure(s) Performed: TRANSESOPHAGEAL ECHOCARDIOGRAM     Patient location during evaluation: PACU Anesthesia Type: MAC Level of consciousness: awake and alert Pain management: pain level controlled Vital Signs Assessment: post-procedure vital signs reviewed and stable Respiratory status: spontaneous breathing, nonlabored ventilation, respiratory function stable and patient connected to nasal cannula oxygen Cardiovascular status: stable and blood pressure returned to baseline Postop Assessment: no apparent nausea or vomiting Anesthetic complications: no   There were no known notable events for this encounter.  Last Vitals:  Vitals:   08/14/24 1043 08/14/24 1053  BP: (!) 131/54 (!) 135/51  Pulse: 65 72  Resp: 11 12  Temp:    SpO2: 98% 95%    Last Pain:  Vitals:   08/14/24 1053  TempSrc:   PainSc: 0-No pain                 [name] Cornea      "

## 2024-08-14 NOTE — Interval H&P Note (Signed)
 History and Physical Interval Note:  08/14/2024 8:42 AM  [name] Factor  has presented today for surgery, with the diagnosis of AFIB, POST WATCHMAN.  The various methods of treatment have been discussed with the patient and family. After consideration of risks, benefits and other options for treatment, the patient has consented to  Procedures: TRANSESOPHAGEAL ECHOCARDIOGRAM (N/A) as a surgical intervention.  The patient's history has been reviewed, patient examined, no change in status, stable for surgery.  I have reviewed the patient's chart and labs.  Questions were answered to the patient's satisfaction.     Benjamin Merrihew

## 2024-08-14 NOTE — Anesthesia Preprocedure Evaluation (Addendum)
 "                                  Anesthesia Evaluation  Patient identified by MRN, date of birth, ID band Patient awake    Reviewed: Allergy & Precautions, NPO status , Patient's Chart, lab work & pertinent test results, reviewed documented beta blocker date and time   History of Anesthesia Complications Negative for: history of anesthetic complications  Airway Mallampati: II  TM Distance: >3 FB     Dental no notable dental hx.    Pulmonary shortness of breath and with exertion, asthma , pneumonia, COPD, former smoker   breath sounds clear to auscultation       Cardiovascular hypertension, (-) angina + CAD and +CHF  (-) Past MI and (-) Cardiac Stents + Valvular Problems/Murmurs  Rhythm:Irregular Rate:Normal  IMPRESSIONS     1. Left ventricular ejection fraction, by estimation, is 55 to 60%. The  left ventricle has normal function.   2. Right ventricular systolic function is moderately reduced. The right  ventricular size is mildly enlarged.   3. Prior to procedure, a patent left atrial appendage was noted and  confirmed by Definity . 2D sizing for LAA- 20 mm.      A 27 mm Watchman FLX pro was placed. Average compression ~17 %. No  leaks or DVT. Negative tug test. Shunting post procedure all left to  right. No pericardial effusion.. Left atrial size was moderately dilated.  No left atrial/left atrial appendage  thrombus was detected.   4. Right atrial size was moderately dilated.   5. The mitral valve is normal in structure. Trivial mitral valve  regurgitation. No evidence of mitral stenosis.   6. 2D vena contracta. The aortic valve is tricuspid. Aortic valve  regurgitation is mild. Aortic valve sclerosis is present, with no evidence  of aortic valve stenosis.   7. There is Moderate (Grade III) plaque involving the descending aorta.   8. 3D performed of the LAA and demonstrates 3D LA dimension prior to  procedure 20 mm.   9. Evidence of atrial level shunting  detected by color flow Doppler.  10. Patient under general anesthesia for procedure.     Neuro/Psych neg Seizures PSYCHIATRIC DISORDERS Anxiety Depression     Neuromuscular disease    GI/Hepatic ,GERD  Medicated and Controlled,,(+) neg Cirrhosis        Endo/Other  diabetes, Type 2    Renal/GU Renal disease     Musculoskeletal  (+) Arthritis , Osteoarthritis,    Abdominal   Peds  Hematology  (+) Blood dyscrasia, anemia   Anesthesia Other Findings   Reproductive/Obstetrics                              Anesthesia Physical Anesthesia Plan  ASA: 3  Anesthesia Plan: MAC   Post-op Pain Management:    Induction: Intravenous  PONV Risk Score and Plan: 2 and Ondansetron  and Propofol  infusion  Airway Management Planned: Natural Airway and Nasal Cannula  Additional Equipment:   Intra-op Plan:   Post-operative Plan:   Informed Consent: I have reviewed the patients History and Physical, chart, labs and discussed the procedure including the risks, benefits and alternatives for the proposed anesthesia with the patient or authorized representative who has indicated his/her understanding and acceptance.     Dental advisory given  Plan Discussed with: CRNA  Anesthesia  Plan Comments:         Anesthesia Quick Evaluation  "

## 2024-08-14 NOTE — Discharge Instructions (Signed)
 TEE  YOU HAD AN CARDIAC PROCEDURE TODAY: Refer to the procedure report and other information in the discharge instructions given to you for any specific questions about what was found during the examination. If this information does not answer your questions, please call [facility] office at [phone] to clarify.   DIET: Your first meal following the procedure should be a light meal and then it is ok to progress to your normal diet. A half-sandwich or bowl of soup is an example of a good first meal. Heavy or fried foods are harder to digest and may make you feel nauseous or bloated. Drink plenty of fluids but you should avoid alcoholic beverages for 24 hours. If you had a esophageal dilation, please see attached instructions for diet.   ACTIVITY: Your care partner should take you home directly after the procedure. You should plan to take it easy, moving slowly for the rest of the day. You can resume normal activity the day after the procedure however YOU SHOULD NOT DRIVE, use power tools, machinery or perform tasks that involve climbing or major physical exertion for 24 hours (because of the sedation medicines used during the test).   SYMPTOMS TO REPORT IMMEDIATELY: A cardiologist can be reached at any hour. Please call [phone] for any of the following symptoms:  Vomiting of blood or coffee ground material  New, significant abdominal pain  New, significant chest pain or pain under the shoulder blades  Painful or persistently difficult swallowing  New shortness of breath  Black, tarry-looking or red, bloody stools  FOLLOW UP:  Please also call with any specific questions about appointments or follow up tests.

## 2024-08-14 NOTE — Transfer of Care (Signed)
 Immediate Anesthesia Transfer of Care Note  Patient: [name]  Procedure(s) Performed: TRANSESOPHAGEAL ECHOCARDIOGRAM  Patient Location: PACU and Cath Lab  Anesthesia Type:MAC  Level of Consciousness: drowsy  Airway & Oxygen Therapy: Patient Spontanous Breathing and Patient connected to nasal cannula oxygen  Post-op Assessment: Report given to RN and Post -op Vital signs reviewed and stable  Post vital signs: Reviewed and stable  Last Vitals:  Vitals Value Taken Time  BP 131/73 08/14/24 10:35  Temp 36.8 C 08/14/24 10:33  Pulse 77 08/14/24 10:37  Resp 10 08/14/24 10:37  SpO2 98 % 08/14/24 10:37  Vitals shown include unfiled device data.  Last Pain:  Vitals:   08/14/24 1033  TempSrc: Tympanic  PainSc: Asleep         Complications: There were no known notable events for this encounter.

## 2024-08-14 NOTE — Op Note (Signed)
 INDICATIONS: Post Watchman evaluation  PROCEDURE:   Informed consent was obtained prior to the procedure. The risks, benefits and alternatives for the procedure were discussed and the patient comprehended these risks.  Risks include, but are not limited to, cough, sore throat, vomiting, nausea, somnolence, esophageal and stomach trauma or perforation, bleeding, low blood pressure, aspiration, pneumonia, infection, trauma to the teeth and death.    During this procedure the patient was administered IV propofol  by Anesthesiology.  The transesophageal probe was inserted in the esophagus and stomach without difficulty and multiple views were obtained.  The patient was kept under observation until the patient left the procedure room.  The patient left the procedure room in stable condition.   Agitated microbubble saline contrast was not administered.  COMPLICATIONS:    There were no immediate complications.  FINDINGS:  Well seated Watchman left atrial appendage occluder. There may be a very small (1 mm or less) leak anteriorly inm the 135 deg view. Severe biatrial dilation. No residual iatrogenic ASD. Normal LV systolic function. Moderate AI. Moderate aortic atherosclerosis.  RECOMMENDATIONS:     Good seal of the left atrial appendage. DC anticoagulants per protocol.  Time Spent Directly with the Patient:  45  minutes   Holt Woolbright 08/14/2024, 10:35 AM

## 2024-08-15 ENCOUNTER — Encounter (HOSPITAL_COMMUNITY): Payer: Self-pay | Admitting: Cardiovascular Disease

## 2024-08-18 ENCOUNTER — Telehealth: Payer: Self-pay | Admitting: Cardiovascular Disease

## 2024-08-18 MED ORDER — DILTIAZEM HCL ER COATED BEADS 120 MG PO CP24: 120.0000 mg | ORAL_CAPSULE | Freq: Every day | ORAL | 2 refills | Status: AC

## 2024-08-18 MED ORDER — METOPROLOL SUCCINATE ER 50 MG PO TB24: 50.0000 mg | ORAL_TABLET | Freq: Two times a day (BID) | ORAL | 2 refills | Status: AC

## 2024-08-18 NOTE — Telephone Encounter (Signed)
 Refills has been sent to the pharmacy.

## 2024-08-18 NOTE — Telephone Encounter (Signed)
" °*  STAT* If patient is at the pharmacy, call can be transferred to refill team.   1. Which medications need to be refilled? (please list name of each medication and dose if known)   diltiazem  (CARDIZEM  CD) 120 MG 24 hr capsule (Expired)     metoprolol  succinate (TOPROL -XL) 50 MG 24 hr tablet    2. Which pharmacy/location (including street and city if local pharmacy) is medication to be sent to? [facility] [place], [place] - [address]   3. Do they need a 30 day or 90 day supply? 90   Patient is out of medication "

## 2024-08-21 ENCOUNTER — Telehealth: Payer: Self-pay

## 2024-08-21 NOTE — Telephone Encounter (Signed)
 Spoke with patient. Reviewed TEE results with patient explaining Dr. Kennyth reviewed. Showing well seated Watchman. She will continue with Eliquis  5mg  for now.   Arranged 6 month f/u with Charlies Arthur PA-C 12/21/2024.  ____________________________________________________________________ Kennyth Chew, MD  Waylon Edsel HERO, RN Thanks for letting me know. Data suggests nothing to do when leak is <79mm.  Josh

## 2024-08-23 DIAGNOSIS — J439 Emphysema, unspecified: Secondary | ICD-10-CM

## 2024-08-27 ENCOUNTER — Encounter: Payer: Self-pay | Admitting: Internal Medicine

## 2024-08-27 ENCOUNTER — Other Ambulatory Visit: Payer: Self-pay | Admitting: Internal Medicine

## 2024-08-29 ENCOUNTER — Other Ambulatory Visit: Payer: Self-pay | Admitting: Internal Medicine

## 2024-08-31 MED ORDER — HYDROCODONE-ACETAMINOPHEN 5-325 MG PO TABS: 1.0000 | ORAL_TABLET | Freq: Every day | ORAL | 0 refills | Status: AC | PRN

## 2024-09-02 MED ORDER — UMECLIDINIUM-VILANTEROL 62.5-25 MCG/ACT IN AEPB: 1.0000 | INHALATION_SPRAY | Freq: Every day | RESPIRATORY_TRACT | 5 refills | Status: AC

## 2024-09-02 NOTE — Telephone Encounter (Signed)
 Changed back from Advair to anoro at patient request due to tachycardia.

## 2024-09-08 ENCOUNTER — Other Ambulatory Visit: Payer: Self-pay | Admitting: Cardiovascular Disease

## 2024-09-08 DIAGNOSIS — I4821 Permanent atrial fibrillation: Secondary | ICD-10-CM

## 2024-09-14 ENCOUNTER — Other Ambulatory Visit: Payer: Self-pay | Admitting: Internal Medicine

## 2024-10-14 ENCOUNTER — Ambulatory Visit

## 2024-10-27 ENCOUNTER — Ambulatory Visit: Admitting: Podiatry

## 2024-12-07 ENCOUNTER — Ambulatory Visit: Admitting: Internal Medicine

## 2024-12-21 ENCOUNTER — Ambulatory Visit: Admitting: Physician Assistant

## 2025-07-05 ENCOUNTER — Ambulatory Visit

## 2025-07-05 ENCOUNTER — Encounter: Admitting: Internal Medicine
